# Patient Record
Sex: Male | Born: 1970 | Race: White | Hispanic: No | Marital: Married
Health system: Southern US, Academic
[De-identification: ages and names within clinical notes are randomized; demographics above are authoritative.]

## PROBLEM LIST (undated history)

## (undated) ENCOUNTER — Encounter

## (undated) ENCOUNTER — Ambulatory Visit: Payer: MEDICARE

## (undated) ENCOUNTER — Ambulatory Visit: Attending: Clinical | Primary: Clinical

## (undated) ENCOUNTER — Telehealth

## (undated) ENCOUNTER — Ambulatory Visit

## (undated) ENCOUNTER — Inpatient Hospital Stay

## (undated) ENCOUNTER — Encounter: Attending: Physical Medicine & Rehabilitation | Primary: Physical Medicine & Rehabilitation

## (undated) ENCOUNTER — Telehealth
Attending: Student in an Organized Health Care Education/Training Program | Primary: Student in an Organized Health Care Education/Training Program

## (undated) ENCOUNTER — Ambulatory Visit
Attending: Rehabilitative and Restorative Service Providers" | Primary: Rehabilitative and Restorative Service Providers"

## (undated) ENCOUNTER — Telehealth
Attending: Pharmacist Clinician (PhC)/ Clinical Pharmacy Specialist | Primary: Pharmacist Clinician (PhC)/ Clinical Pharmacy Specialist

## (undated) ENCOUNTER — Encounter: Attending: Clinical | Primary: Clinical

## (undated) ENCOUNTER — Ambulatory Visit
Payer: MEDICARE | Attending: Rehabilitative and Restorative Service Providers" | Primary: Rehabilitative and Restorative Service Providers"

## (undated) ENCOUNTER — Encounter
Attending: Pharmacist Clinician (PhC)/ Clinical Pharmacy Specialist | Primary: Pharmacist Clinician (PhC)/ Clinical Pharmacy Specialist

## (undated) ENCOUNTER — Ambulatory Visit: Payer: MEDICARE | Attending: Medical | Primary: Medical

## (undated) ENCOUNTER — Encounter: Attending: Cardiovascular Disease | Primary: Cardiovascular Disease

## (undated) ENCOUNTER — Ambulatory Visit: Payer: MEDICARE | Attending: Surgery | Primary: Surgery

## (undated) ENCOUNTER — Ambulatory Visit: Payer: MEDICARE | Attending: Cardiovascular Disease | Primary: Cardiovascular Disease

## (undated) ENCOUNTER — Telehealth: Attending: Adult Health | Primary: Adult Health

## (undated) ENCOUNTER — Ambulatory Visit: Payer: MEDICARE | Attending: Clinical | Primary: Clinical

## (undated) ENCOUNTER — Telehealth: Attending: Psychiatry | Primary: Psychiatry

## (undated) ENCOUNTER — Encounter: Attending: Infectious Disease | Primary: Infectious Disease

## (undated) ENCOUNTER — Ambulatory Visit: Payer: MEDICARE | Attending: Adult Health | Primary: Adult Health

## (undated) ENCOUNTER — Telehealth: Attending: Internal Medicine | Primary: Internal Medicine

## (undated) ENCOUNTER — Ambulatory Visit: Payer: MEDICARE | Attending: Physician Assistant | Primary: Physician Assistant

## (undated) ENCOUNTER — Encounter
Attending: Student in an Organized Health Care Education/Training Program | Primary: Student in an Organized Health Care Education/Training Program

## (undated) ENCOUNTER — Telehealth: Attending: Clinical | Primary: Clinical

## (undated) ENCOUNTER — Telehealth: Payer: MEDICARE | Attending: Medical | Primary: Medical

## (undated) ENCOUNTER — Telehealth: Attending: Medical | Primary: Medical

## (undated) ENCOUNTER — Ambulatory Visit: Attending: Pharmacist | Primary: Pharmacist

## (undated) ENCOUNTER — Ambulatory Visit: Payer: MEDICARE | Attending: Internal Medicine | Primary: Internal Medicine

## (undated) ENCOUNTER — Encounter: Attending: Internal Medicine | Primary: Internal Medicine

## (undated) ENCOUNTER — Ambulatory Visit: Payer: MEDICARE | Attending: Foot & Ankle Surgery | Primary: Foot & Ankle Surgery

## (undated) ENCOUNTER — Ambulatory Visit: Payer: MEDICARE | Attending: Nurse Practitioner | Primary: Nurse Practitioner

## (undated) ENCOUNTER — Ambulatory Visit: Payer: MEDICARE | Attending: Family | Primary: Family

## (undated) ENCOUNTER — Encounter: Attending: Nurse Practitioner | Primary: Nurse Practitioner

## (undated) ENCOUNTER — Encounter: Attending: Medical | Primary: Medical

## (undated) ENCOUNTER — Telehealth: Attending: Pediatric Pulmonology | Primary: Pediatric Pulmonology

## (undated) ENCOUNTER — Encounter: Attending: Neurology | Primary: Neurology

## (undated) ENCOUNTER — Ambulatory Visit
Payer: MEDICARE | Attending: Student in an Organized Health Care Education/Training Program | Primary: Student in an Organized Health Care Education/Training Program

## (undated) ENCOUNTER — Ambulatory Visit: Payer: MEDICARE | Attending: Nephrology | Primary: Nephrology

## (undated) DIAGNOSIS — I255 Ischemic cardiomyopathy: Secondary | ICD-10-CM

## (undated) DIAGNOSIS — I480 Paroxysmal atrial fibrillation: Secondary | ICD-10-CM

## (undated) DIAGNOSIS — E785 Hyperlipidemia, unspecified: Secondary | ICD-10-CM

## (undated) DIAGNOSIS — I10 Essential (primary) hypertension: Secondary | ICD-10-CM

## (undated) DIAGNOSIS — E119 Type 2 diabetes mellitus without complications: Secondary | ICD-10-CM

## (undated) DIAGNOSIS — I219 Acute myocardial infarction, unspecified: Secondary | ICD-10-CM

## (undated) DIAGNOSIS — I5022 Chronic systolic (congestive) heart failure: Secondary | ICD-10-CM

## (undated) DIAGNOSIS — N289 Disorder of kidney and ureter, unspecified: Secondary | ICD-10-CM

## (undated) DIAGNOSIS — I2510 Atherosclerotic heart disease of native coronary artery without angina pectoris: Secondary | ICD-10-CM

## (undated) HISTORY — PX: JOINT REPLACEMENT: SHX530

## (undated) HISTORY — PX: OTHER SURGICAL HISTORY: SHX169

## (undated) HISTORY — PX: PERCUTANEOUS CORONARY STENT INTERVENTION (PCI-S): SHX6016

## (undated) HISTORY — PX: CARDIAC SURGERY: SHX584

## (undated) MED ORDER — AMLODIPINE 10 MG TABLET: Freq: Every day | ORAL | 0.00000 days | Status: SS

## (undated) MED ORDER — ONDANSETRON HCL 4 MG TABLET: 4 mg | tablet | Freq: Three times a day (TID) | 0 refills | 10 days

---
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
## 2016-11-04 MED FILL — FLUOXETINE HCL/20MG/CAPS: FLUOXETINE HCL/20MG/CAPS | 90 days supply | Qty: 90 | Fill #3

## 2016-11-04 MED FILL — LOSARTAN POTASSIUM/25MG/TABS: LOSARTAN POTASSIUM/25MG/TABS | 90 days supply | Qty: 90 | Fill #1

## 2016-11-04 MED FILL — ELIQUIS/5MG/TABS: ELIQUIS/5MG/TABS | 90 days supply | Qty: 180 | Fill #1

## 2016-11-04 MED FILL — METFORMIN HYDROCHLORIDE/500MG/TABS: METFORMIN HYDROCHLORIDE/500MG/TABS | 90 days supply | Qty: 360 | Fill #1

## 2016-11-05 ENCOUNTER — Ambulatory Visit
Admission: RE | Admit: 2016-11-05 | Discharge: 2016-11-05 | Payer: MEDICARE | Attending: Student in an Organized Health Care Education/Training Program | Admitting: Student in an Organized Health Care Education/Training Program

## 2016-11-05 DIAGNOSIS — T148XXA Other injury of unspecified body region, initial encounter: Principal | ICD-10-CM

## 2016-11-05 MED ORDER — CYCLOBENZAPRINE 5 MG TABLET: 5 mg | tablet | Freq: Three times a day (TID) | 0 refills | 0 days | Status: AC

## 2016-11-05 MED ORDER — CYCLOBENZAPRINE 5 MG TABLET
ORAL_TABLET | Freq: Three times a day (TID) | ORAL | 0 refills | 0.00000 days | Status: CP | PRN
Start: 2016-11-05 — End: 2017-02-24

## 2016-11-05 MED FILL — CYCLOBENZAPRINE HCL/5MG/TABS: CYCLOBENZAPRINE HCL/5MG/TABS | 5 days supply | Qty: 15 | Fill #0

## 2016-11-19 ENCOUNTER — Ambulatory Visit: Admission: RE | Admit: 2016-11-19 | Discharge: 2016-11-19 | Payer: MEDICARE | Admitting: Physician Assistant

## 2016-11-19 ENCOUNTER — Ambulatory Visit: Admission: RE | Admit: 2016-11-19 | Discharge: 2016-11-19 | Payer: MEDICARE | Attending: Internal Medicine

## 2016-11-19 ENCOUNTER — Ambulatory Visit: Admission: RE | Admit: 2016-11-19 | Discharge: 2016-11-19 | Payer: MEDICARE | Attending: Clinical | Admitting: Clinical

## 2016-11-19 DIAGNOSIS — I10 Essential (primary) hypertension: Secondary | ICD-10-CM

## 2016-11-19 DIAGNOSIS — E1142 Type 2 diabetes mellitus with diabetic polyneuropathy: Principal | ICD-10-CM

## 2016-11-19 DIAGNOSIS — M109 Gout, unspecified: Secondary | ICD-10-CM

## 2016-11-19 DIAGNOSIS — F3342 Major depressive disorder, recurrent, in full remission: Principal | ICD-10-CM

## 2016-11-19 DIAGNOSIS — Z794 Long term (current) use of insulin: Secondary | ICD-10-CM

## 2016-11-19 MED ORDER — LIRAGLUTIDE 0.6 MG/0.1 ML (18 MG/3 ML) SUBCUTANEOUS PEN INJECTOR
Freq: Every day | SUBCUTANEOUS | 3 refills | 0.00000 days | Status: CP
Start: 2016-11-19 — End: 2017-04-17

## 2016-11-19 MED ORDER — LIRAGLUTIDE 0.6 MG/0.1 ML (18 MG/3 ML) SUBCUTANEOUS PEN INJECTOR: 2 mg | mL | Freq: Every day | 3 refills | 0 days | Status: AC

## 2016-11-27 DIAGNOSIS — I10 Essential (primary) hypertension: Secondary | ICD-10-CM

## 2016-11-27 DIAGNOSIS — E1142 Type 2 diabetes mellitus with diabetic polyneuropathy: Principal | ICD-10-CM

## 2016-11-27 DIAGNOSIS — Z794 Long term (current) use of insulin: Secondary | ICD-10-CM

## 2016-11-27 DIAGNOSIS — T148XXA Other injury of unspecified body region, initial encounter: Secondary | ICD-10-CM

## 2016-12-05 ENCOUNTER — Emergency Department
Admission: EM | Admit: 2016-12-05 | Discharge: 2016-12-05 | Disposition: A | Discharge status: Home / Self Care | Payer: MEDICARE | Source: Intra-hospital | Attending: Emergency Medicine | Admitting: Emergency Medicine

## 2016-12-05 ENCOUNTER — Emergency Department
Admission: EM | Admit: 2016-12-05 | Discharge: 2016-12-05 | Disposition: A | Discharge status: Home / Self Care | Payer: MEDICARE | Source: Intra-hospital

## 2016-12-05 DIAGNOSIS — S60562A Insect bite (nonvenomous) of left hand, initial encounter: Principal | ICD-10-CM

## 2016-12-09 ENCOUNTER — Ambulatory Visit
Admission: RE | Admit: 2016-12-09 | Discharge: 2016-12-09 | Payer: MEDICARE | Attending: Student in an Organized Health Care Education/Training Program | Admitting: Student in an Organized Health Care Education/Training Program

## 2016-12-09 DIAGNOSIS — M7989 Other specified soft tissue disorders: Principal | ICD-10-CM

## 2016-12-24 ENCOUNTER — Ambulatory Visit
Admission: RE | Admit: 2016-12-24 | Discharge: 2016-12-24 | Disposition: A | Discharge status: Home / Self Care | Payer: MEDICARE | Admitting: Internal Medicine

## 2016-12-24 ENCOUNTER — Ambulatory Visit
Admission: RE | Admit: 2016-12-24 | Discharge: 2016-12-24 | Disposition: A | Discharge status: Home / Self Care | Payer: MEDICARE

## 2016-12-24 DIAGNOSIS — I480 Paroxysmal atrial fibrillation: Secondary | ICD-10-CM

## 2016-12-24 DIAGNOSIS — E119 Type 2 diabetes mellitus without complications: Principal | ICD-10-CM

## 2016-12-24 DIAGNOSIS — M1A39X1 Chronic gout due to renal impairment, multiple sites, with tophus (tophi): Secondary | ICD-10-CM

## 2016-12-24 MED ORDER — APIXABAN 5 MG TABLET
ORAL_TABLET | Freq: Two times a day (BID) | ORAL | 11 refills | 0.00000 days
Start: 2016-12-24 — End: 2016-12-24

## 2016-12-24 MED ORDER — APIXABAN 5 MG TABLET: 5 mg | tablet | Freq: Two times a day (BID) | 11 refills | 0 days | Status: AC

## 2016-12-25 MED ORDER — MAGNESIUM OXIDE 400 MG (241.3 MG MAGNESIUM) TABLET: 400 mg | tablet | Freq: Every day | 1 refills | 0 days | Status: AC

## 2016-12-25 MED ORDER — MAGNESIUM OXIDE 400 MG (241.3 MG MAGNESIUM) TABLET
ORAL_TABLET | Freq: Every day | ORAL | 1 refills | 0.00000 days | Status: CP
Start: 2016-12-25 — End: 2017-01-30

## 2016-12-25 MED FILL — MAGNESIUM OXIDE/400MG/TABS: MAGNESIUM OXIDE/400MG/TABS | 30 days supply | Qty: 30 | Fill #0

## 2017-01-13 ENCOUNTER — Inpatient Hospital Stay
Admission: EM | Admit: 2017-01-13 | Discharge: 2017-01-15 | Disposition: A | Discharge status: Home / Self Care | Payer: MEDICARE | Source: Intra-hospital

## 2017-01-13 ENCOUNTER — Inpatient Hospital Stay
Admission: EM | Admit: 2017-01-13 | Discharge: 2017-01-15 | Disposition: A | Discharge status: Home / Self Care | Payer: MEDICARE | Source: Intra-hospital | Attending: Cardiovascular Disease | Admitting: Cardiovascular Disease

## 2017-01-13 DIAGNOSIS — I214 Non-ST elevation (NSTEMI) myocardial infarction: Principal | ICD-10-CM

## 2017-01-14 DIAGNOSIS — I214 Non-ST elevation (NSTEMI) myocardial infarction: Principal | ICD-10-CM

## 2017-01-15 MED ORDER — ISOSORBIDE MONONITRATE ER 30 MG TABLET,EXTENDED RELEASE 24 HR
ORAL_TABLET | Freq: Every day | ORAL | 1 refills | 0 days | Status: SS
Start: 2017-01-15 — End: 2017-02-13

## 2017-01-15 MED FILL — ISOSORBIDE MONONIT/30MG ER/TAB: ISOSORBIDE MONONIT/30MG ER/TAB | 30 days supply | Qty: 30 | Fill #0

## 2017-01-19 ENCOUNTER — Ambulatory Visit
Admission: RE | Admit: 2017-01-19 | Discharge: 2017-01-19 | Disposition: A | Discharge status: Home / Self Care | Payer: MEDICARE

## 2017-01-19 ENCOUNTER — Ambulatory Visit
Admission: RE | Admit: 2017-01-19 | Discharge: 2017-01-19 | Disposition: A | Discharge status: Home / Self Care | Payer: MEDICARE | Admitting: Internal Medicine

## 2017-01-19 ENCOUNTER — Ambulatory Visit: Admission: RE | Admit: 2017-01-19 | Discharge: 2017-01-19 | Disposition: A | Discharge status: Home / Self Care

## 2017-01-19 DIAGNOSIS — N179 Acute kidney failure, unspecified: Principal | ICD-10-CM

## 2017-01-19 DIAGNOSIS — E875 Hyperkalemia: Secondary | ICD-10-CM

## 2017-01-19 DIAGNOSIS — Z09 Encounter for follow-up examination after completed treatment for conditions other than malignant neoplasm: Secondary | ICD-10-CM

## 2017-01-19 DIAGNOSIS — E1122 Type 2 diabetes mellitus with diabetic chronic kidney disease: Secondary | ICD-10-CM

## 2017-01-19 DIAGNOSIS — Z794 Long term (current) use of insulin: Secondary | ICD-10-CM

## 2017-01-19 DIAGNOSIS — N182 Chronic kidney disease, stage 2 (mild): Secondary | ICD-10-CM

## 2017-01-19 DIAGNOSIS — I480 Paroxysmal atrial fibrillation: Secondary | ICD-10-CM

## 2017-01-19 DIAGNOSIS — I5022 Chronic systolic (congestive) heart failure: Secondary | ICD-10-CM

## 2017-01-19 MED ORDER — UNIFINE PENTIPS 31 GAUGE X 5/16" NEEDLE
3 refills | 0 days | Status: CP
Start: 2017-01-19 — End: 2017-09-08

## 2017-01-19 MED ORDER — PEN NEEDLE, DIABETIC 31 GAUGE X 5/16" (8 MM)
3 refills | 0.00000 days
Start: 2017-01-19 — End: 2017-01-19

## 2017-01-19 MED ORDER — PEN NEEDLE, DIABETIC 31 GAUGE X 5/16" (8 MM): each | 0 refills | 0 days

## 2017-01-19 MED FILL — MAGNESIUM OXIDE/400MG/TABS: MAGNESIUM OXIDE/400MG/TABS | 30 days supply | Qty: 30 | Fill #1

## 2017-01-19 MED FILL — CARVEDILOL/25MG/TABS: CARVEDILOL/25MG/TABS | 90 days supply | Qty: 360 | Fill #1

## 2017-01-19 MED FILL — ATORVASTATIN CALCIUM/80MG/TABS: ATORVASTATIN CALCIUM/80MG/TABS | 90 days supply | Qty: 90 | Fill #1

## 2017-01-19 MED FILL — TRAZODONE HCL/50MG/TABS: TRAZODONE HCL/50MG/TABS | 45 days supply | Qty: 90 | Fill #1

## 2017-01-19 MED FILL — UF SHORT PEN NEEDLE/31G-8MM/NDL: UF SHORT PEN NEEDLE/31G-8MM/NDL | 20 days supply | Qty: 1 | Fill #0

## 2017-01-19 MED FILL — OMEPRAZOLE/20MG/CPDR: OMEPRAZOLE/20MG/CPDR | 30 days supply | Qty: 30 | Fill #1

## 2017-01-20 MED ORDER — NITROGLYCERIN 0.4 MG SUBLINGUAL TABLET
ORAL_TABLET | 11 refills | 0 days | Status: CP
Start: 2017-01-20 — End: 2017-05-18

## 2017-01-20 MED ORDER — PREGABALIN 50 MG CAPSULE
ORAL_CAPSULE | Freq: Three times a day (TID) | ORAL | 11 refills | 0 days | Status: CP
Start: 2017-01-20 — End: 2017-02-24

## 2017-01-30 ENCOUNTER — Ambulatory Visit
Admission: RE | Admit: 2017-01-30 | Discharge: 2017-01-30 | Disposition: A | Discharge status: Home / Self Care | Admitting: Adult Health

## 2017-01-30 DIAGNOSIS — I10 Essential (primary) hypertension: Secondary | ICD-10-CM

## 2017-01-30 DIAGNOSIS — E875 Hyperkalemia: Secondary | ICD-10-CM

## 2017-01-30 DIAGNOSIS — N179 Acute kidney failure, unspecified: Secondary | ICD-10-CM

## 2017-01-30 DIAGNOSIS — G4733 Obstructive sleep apnea (adult) (pediatric): Secondary | ICD-10-CM

## 2017-01-30 DIAGNOSIS — E1122 Type 2 diabetes mellitus with diabetic chronic kidney disease: Secondary | ICD-10-CM

## 2017-01-30 DIAGNOSIS — I25118 Atherosclerotic heart disease of native coronary artery with other forms of angina pectoris: Principal | ICD-10-CM

## 2017-01-30 DIAGNOSIS — I480 Paroxysmal atrial fibrillation: Secondary | ICD-10-CM

## 2017-01-30 DIAGNOSIS — I5022 Chronic systolic (congestive) heart failure: Secondary | ICD-10-CM

## 2017-01-30 DIAGNOSIS — Z794 Long term (current) use of insulin: Secondary | ICD-10-CM

## 2017-01-30 DIAGNOSIS — E7800 Pure hypercholesterolemia, unspecified: Secondary | ICD-10-CM

## 2017-01-30 DIAGNOSIS — N182 Chronic kidney disease, stage 2 (mild): Secondary | ICD-10-CM

## 2017-01-30 MED ORDER — LOSARTAN 25 MG TABLET
ORAL_TABLET | Freq: Every day | ORAL | 3 refills | 0.00000 days | Status: CP
Start: 2017-01-30 — End: 2017-01-30

## 2017-01-30 MED ORDER — METFORMIN 500 MG TABLET: 500 mg | tablet | 3 refills | 0 days

## 2017-01-30 MED ORDER — METFORMIN 500 MG TABLET
ORAL_TABLET | Freq: Two times a day (BID) | ORAL | 3 refills | 0.00000 days | Status: CP
Start: 2017-01-30 — End: 2017-01-30

## 2017-01-30 MED ORDER — LOSARTAN 25 MG TABLET: 25 mg | tablet | Freq: Every day | 3 refills | 0 days | Status: AC

## 2017-02-02 ENCOUNTER — Ambulatory Visit
Admission: RE | Admit: 2017-02-02 | Discharge: 2017-02-02 | Disposition: A | Discharge status: Home / Self Care | Payer: MEDICARE | Admitting: Internal Medicine

## 2017-02-02 ENCOUNTER — Ambulatory Visit
Admission: RE | Admit: 2017-02-02 | Discharge: 2017-02-02 | Disposition: A | Discharge status: Home / Self Care | Payer: MEDICARE | Attending: Clinical | Admitting: Clinical

## 2017-02-02 ENCOUNTER — Ambulatory Visit
Admission: RE | Admit: 2017-02-02 | Discharge: 2017-02-02 | Disposition: A | Discharge status: Home / Self Care | Payer: MEDICARE

## 2017-02-02 DIAGNOSIS — E1122 Type 2 diabetes mellitus with diabetic chronic kidney disease: Secondary | ICD-10-CM

## 2017-02-02 DIAGNOSIS — E032 Hypothyroidism due to medicaments and other exogenous substances: Principal | ICD-10-CM

## 2017-02-02 DIAGNOSIS — I10 Essential (primary) hypertension: Secondary | ICD-10-CM

## 2017-02-02 DIAGNOSIS — F3342 Major depressive disorder, recurrent, in full remission: Secondary | ICD-10-CM

## 2017-02-02 DIAGNOSIS — N182 Chronic kidney disease, stage 2 (mild): Secondary | ICD-10-CM

## 2017-02-02 DIAGNOSIS — I25118 Atherosclerotic heart disease of native coronary artery with other forms of angina pectoris: Secondary | ICD-10-CM

## 2017-02-02 DIAGNOSIS — Z794 Long term (current) use of insulin: Secondary | ICD-10-CM

## 2017-02-11 ENCOUNTER — Ambulatory Visit: Admission: RE | Admit: 2017-02-11 | Discharge: 2017-02-11 | Payer: MEDICARE | Admitting: Physician Assistant

## 2017-02-11 DIAGNOSIS — I25118 Atherosclerotic heart disease of native coronary artery with other forms of angina pectoris: Secondary | ICD-10-CM

## 2017-02-11 DIAGNOSIS — E1142 Type 2 diabetes mellitus with diabetic polyneuropathy: Principal | ICD-10-CM

## 2017-02-11 DIAGNOSIS — I10 Essential (primary) hypertension: Secondary | ICD-10-CM

## 2017-02-11 DIAGNOSIS — Z794 Long term (current) use of insulin: Secondary | ICD-10-CM

## 2017-02-13 ENCOUNTER — Ambulatory Visit
Admission: RE | Admit: 2017-02-13 | Discharge: 2017-02-13 | Disposition: A | Discharge status: Home / Self Care | Payer: MEDICARE | Attending: Cardiovascular Disease | Admitting: Cardiovascular Disease

## 2017-02-13 MED ORDER — COLCHICINE 0.6 MG TABLET
ORAL_TABLET | ORAL | 11 refills | 0 days
Start: 2017-02-13 — End: 2017-02-13

## 2017-02-13 MED ORDER — ISOSORBIDE MONONITRATE ER 30 MG TABLET,EXTENDED RELEASE 24 HR: tablet | 1 refills | 0 days

## 2017-02-13 MED ORDER — COLCRYS 0.6 MG TABLET
ORAL_TABLET | 11 refills | 0 days | Status: CP
Start: 2017-02-13 — End: 2017-04-17

## 2017-02-13 MED ORDER — ISOSORBIDE MONONITRATE ER 30 MG TABLET,EXTENDED RELEASE 24 HR
ORAL_TABLET | Freq: Every day | ORAL | 1 refills | 0.00000 days | Status: CP
Start: 2017-02-13 — End: 2017-04-17

## 2017-02-13 MED FILL — ELIQUIS/5MG/TABS: ELIQUIS/5MG/TABS | 90 days supply | Qty: 180 | Fill #2

## 2017-02-13 MED FILL — ISOSORBIDE MONONIT/30MG ER/TAB: ISOSORBIDE MONONIT/30MG ER/TAB | 60 days supply | Qty: 120 | Fill #0

## 2017-02-13 MED FILL — FLUOXETINE HCL/40MG/CAPS: FLUOXETINE HCL/40MG/CAPS | 90 days supply | Qty: 90 | Fill #1

## 2017-02-13 MED FILL — UF SHORT PEN NEEDLE/31G-8MM/NDL: UF SHORT PEN NEEDLE/31G-8MM/NDL | 80 days supply | Qty: 4 | Fill #0

## 2017-02-13 MED FILL — COLCRYS/0.6MG/TABS: COLCRYS/0.6MG/TABS | 30 days supply | Qty: 30 | Fill #0

## 2017-02-13 MED FILL — EZETIMIBE/10MG/TABS: EZETIMIBE/10MG/TABS | 90 days supply | Qty: 90 | Fill #1

## 2017-02-24 ENCOUNTER — Ambulatory Visit: Admission: RE | Admit: 2017-02-24 | Discharge: 2017-02-24 | Payer: MEDICARE | Attending: Internal Medicine

## 2017-02-24 DIAGNOSIS — I480 Paroxysmal atrial fibrillation: Principal | ICD-10-CM

## 2017-02-24 DIAGNOSIS — M1A39X1 Chronic gout due to renal impairment, multiple sites, with tophus (tophi): Secondary | ICD-10-CM

## 2017-02-24 DIAGNOSIS — Z794 Long term (current) use of insulin: Secondary | ICD-10-CM

## 2017-02-24 DIAGNOSIS — E1122 Type 2 diabetes mellitus with diabetic chronic kidney disease: Secondary | ICD-10-CM

## 2017-02-24 DIAGNOSIS — I5022 Chronic systolic (congestive) heart failure: Secondary | ICD-10-CM

## 2017-02-24 DIAGNOSIS — J069 Acute upper respiratory infection, unspecified: Secondary | ICD-10-CM

## 2017-02-24 DIAGNOSIS — N182 Chronic kidney disease, stage 2 (mild): Secondary | ICD-10-CM

## 2017-02-24 DIAGNOSIS — I10 Essential (primary) hypertension: Secondary | ICD-10-CM

## 2017-02-24 MED ORDER — METFORMIN 500 MG TABLET
Freq: Two times a day (BID) | ORAL | 0 days
Start: 2017-02-24 — End: 2017-04-17

## 2017-02-24 MED ORDER — CARVEDILOL 25 MG TABLET
Freq: Two times a day (BID) | ORAL | 0 days
Start: 2017-02-24 — End: 2017-04-17

## 2017-02-24 MED ORDER — INSULIN DETEMIR (U-100) 100 UNIT/ML (3 ML) SUBCUTANEOUS PEN
Freq: Every evening | SUBCUTANEOUS | 0 days
Start: 2017-02-24 — End: 2017-04-17

## 2017-02-24 MED ORDER — ALLOPURINOL 100 MG TABLET
ORAL_TABLET | Freq: Every day | ORAL | 0 refills | 0 days | Status: SS
Start: 2017-02-24 — End: 2017-03-25

## 2017-02-24 MED FILL — ALLOPURINOL/100MG/TAB: ALLOPURINOL/100MG/TAB | 30 days supply | Qty: 30 | Fill #0

## 2017-03-03 ENCOUNTER — Ambulatory Visit
Admission: RE | Admit: 2017-03-03 | Discharge: 2017-03-03 | Disposition: A | Discharge status: Home / Self Care | Payer: MEDICARE

## 2017-03-03 ENCOUNTER — Ambulatory Visit: Admission: RE | Admit: 2017-03-03 | Discharge: 2017-03-03 | Payer: MEDICARE | Admitting: Physician Assistant

## 2017-03-03 DIAGNOSIS — Z950 Presence of cardiac pacemaker: Principal | ICD-10-CM

## 2017-03-03 DIAGNOSIS — E1122 Type 2 diabetes mellitus with diabetic chronic kidney disease: Secondary | ICD-10-CM

## 2017-03-03 DIAGNOSIS — I10 Essential (primary) hypertension: Principal | ICD-10-CM

## 2017-03-03 DIAGNOSIS — Z794 Long term (current) use of insulin: Secondary | ICD-10-CM

## 2017-03-03 DIAGNOSIS — I25118 Atherosclerotic heart disease of native coronary artery with other forms of angina pectoris: Secondary | ICD-10-CM

## 2017-03-03 DIAGNOSIS — N182 Chronic kidney disease, stage 2 (mild): Secondary | ICD-10-CM

## 2017-03-03 MED ORDER — INSULIN ASPART (U-100) 100 UNIT/ML (3 ML) SUBCUTANEOUS PEN: 17 [IU] | mL | Freq: Three times a day (TID) | 0 refills | 0 days | Status: AC

## 2017-03-03 MED ORDER — INSULIN ASPART (U-100) 100 UNIT/ML (3 ML) SUBCUTANEOUS PEN
Freq: Three times a day (TID) | SUBCUTANEOUS | 99 refills | 0.00000 days | Status: CP
Start: 2017-03-03 — End: 2017-03-03

## 2017-03-03 MED FILL — NOVOLOG FLEXPEN(BOX)/100UNIT/ML/INJ: NOVOLOG FLEXPEN(BOX)/100UNIT/ML/INJ | 29 days supply | Qty: 1 | Fill #0

## 2017-03-04 ENCOUNTER — Ambulatory Visit: Admission: RE | Admit: 2017-03-04 | Discharge: 2017-03-04 | Disposition: A | Discharge status: Home / Self Care

## 2017-03-04 ENCOUNTER — Ambulatory Visit
Admission: RE | Admit: 2017-03-04 | Discharge: 2017-03-04 | Disposition: A | Discharge status: Home / Self Care | Payer: MEDICARE

## 2017-03-04 DIAGNOSIS — I2510 Atherosclerotic heart disease of native coronary artery without angina pectoris: Principal | ICD-10-CM

## 2017-03-04 MED FILL — NITROGLYCERIN/0.4MG/SUBL: NITROGLYCERIN/0.4MG/SUBL | 30 days supply | Qty: 1 | Fill #1

## 2017-03-04 MED FILL — LEVEMIR FLEXTOUCH (BOX)/100UN/ML/SOLN: LEVEMIR FLEXTOUCH (BOX)/100UN/ML/SOLN | 30 days supply | Qty: 1 | Fill #3

## 2017-03-05 ENCOUNTER — Ambulatory Visit: Admission: RE | Admit: 2017-03-05 | Discharge: 2017-03-05

## 2017-03-05 DIAGNOSIS — I480 Paroxysmal atrial fibrillation: Secondary | ICD-10-CM

## 2017-03-05 DIAGNOSIS — I10 Essential (primary) hypertension: Secondary | ICD-10-CM

## 2017-03-05 DIAGNOSIS — I5022 Chronic systolic (congestive) heart failure: Principal | ICD-10-CM

## 2017-03-05 DIAGNOSIS — I2510 Atherosclerotic heart disease of native coronary artery without angina pectoris: Secondary | ICD-10-CM

## 2017-03-05 DIAGNOSIS — G4733 Obstructive sleep apnea (adult) (pediatric): Secondary | ICD-10-CM

## 2017-03-05 MED ORDER — LOSARTAN 25 MG TABLET
ORAL_TABLET | Freq: Every day | ORAL | 3 refills | 0.00000 days
Start: 2017-03-05 — End: 2017-04-17

## 2017-03-05 MED FILL — VICTOZA (3 PENS)/18MG/3ML/SOLN: VICTOZA (3 PENS)/18MG/3ML/SOLN | 45 days supply | Qty: 1 | Fill #2

## 2017-03-16 ENCOUNTER — Inpatient Hospital Stay
Admission: EM | Admit: 2017-03-16 | Discharge: 2017-04-17 | Disposition: A | Discharge status: Skilled Nursing Facility | Payer: MEDICARE | Source: Intra-hospital

## 2017-03-16 ENCOUNTER — Ambulatory Visit
Admit: 2017-03-16 | Discharge: 2017-04-17 | Disposition: A | Discharge status: Skilled Nursing Facility | Payer: MEDICARE

## 2017-03-16 ENCOUNTER — Inpatient Hospital Stay
Admission: EM | Admit: 2017-03-16 | Discharge: 2017-04-17 | Disposition: A | Discharge status: Skilled Nursing Facility | Payer: MEDICARE | Source: Intra-hospital | Attending: Anesthesiology | Admitting: Anesthesiology

## 2017-03-16 ENCOUNTER — Encounter
Admit: 2017-03-16 | Discharge: 2017-04-17 | Disposition: A | Discharge status: Skilled Nursing Facility | Payer: MEDICARE | Attending: Anesthesiology

## 2017-03-16 DIAGNOSIS — I214 Non-ST elevation (NSTEMI) myocardial infarction: Principal | ICD-10-CM

## 2017-03-16 MED ORDER — INSULIN ASPART (U-100) 100 UNIT/ML (3 ML) SUBCUTANEOUS PEN
Freq: Three times a day (TID) | SUBCUTANEOUS | prn refills | 0.00000 days | Status: CP
Start: 2017-03-16 — End: 2017-04-17

## 2017-03-16 MED ORDER — INSULIN ASPART (U-100) 100 UNIT/ML (3 ML) SUBCUTANEOUS PEN: mL | 99 refills | 0 days

## 2017-03-17 DIAGNOSIS — I214 Non-ST elevation (NSTEMI) myocardial infarction: Principal | ICD-10-CM

## 2017-03-18 DIAGNOSIS — I214 Non-ST elevation (NSTEMI) myocardial infarction: Principal | ICD-10-CM

## 2017-03-19 DIAGNOSIS — I214 Non-ST elevation (NSTEMI) myocardial infarction: Principal | ICD-10-CM

## 2017-03-20 DIAGNOSIS — I214 Non-ST elevation (NSTEMI) myocardial infarction: Principal | ICD-10-CM

## 2017-03-21 DIAGNOSIS — I214 Non-ST elevation (NSTEMI) myocardial infarction: Principal | ICD-10-CM

## 2017-03-22 DIAGNOSIS — I214 Non-ST elevation (NSTEMI) myocardial infarction: Principal | ICD-10-CM

## 2017-03-23 DIAGNOSIS — I214 Non-ST elevation (NSTEMI) myocardial infarction: Principal | ICD-10-CM

## 2017-03-24 DIAGNOSIS — I214 Non-ST elevation (NSTEMI) myocardial infarction: Principal | ICD-10-CM

## 2017-03-25 DIAGNOSIS — I214 Non-ST elevation (NSTEMI) myocardial infarction: Principal | ICD-10-CM

## 2017-03-25 MED ORDER — ALLOPURINOL 100 MG TABLET
ORAL_TABLET | PRN refills | 0 days
Start: 2017-03-25 — End: 2017-04-17

## 2017-03-26 DIAGNOSIS — I214 Non-ST elevation (NSTEMI) myocardial infarction: Principal | ICD-10-CM

## 2017-03-27 DIAGNOSIS — I214 Non-ST elevation (NSTEMI) myocardial infarction: Principal | ICD-10-CM

## 2017-03-28 DIAGNOSIS — I214 Non-ST elevation (NSTEMI) myocardial infarction: Principal | ICD-10-CM

## 2017-03-29 DIAGNOSIS — I214 Non-ST elevation (NSTEMI) myocardial infarction: Principal | ICD-10-CM

## 2017-03-30 DIAGNOSIS — I214 Non-ST elevation (NSTEMI) myocardial infarction: Principal | ICD-10-CM

## 2017-03-31 DIAGNOSIS — I214 Non-ST elevation (NSTEMI) myocardial infarction: Principal | ICD-10-CM

## 2017-04-01 DIAGNOSIS — I214 Non-ST elevation (NSTEMI) myocardial infarction: Principal | ICD-10-CM

## 2017-04-02 DIAGNOSIS — I214 Non-ST elevation (NSTEMI) myocardial infarction: Principal | ICD-10-CM

## 2017-04-03 DIAGNOSIS — I214 Non-ST elevation (NSTEMI) myocardial infarction: Principal | ICD-10-CM

## 2017-04-05 DIAGNOSIS — I214 Non-ST elevation (NSTEMI) myocardial infarction: Principal | ICD-10-CM

## 2017-04-06 DIAGNOSIS — I214 Non-ST elevation (NSTEMI) myocardial infarction: Principal | ICD-10-CM

## 2017-04-07 DIAGNOSIS — I214 Non-ST elevation (NSTEMI) myocardial infarction: Principal | ICD-10-CM

## 2017-04-08 DIAGNOSIS — I214 Non-ST elevation (NSTEMI) myocardial infarction: Principal | ICD-10-CM

## 2017-04-10 DIAGNOSIS — I214 Non-ST elevation (NSTEMI) myocardial infarction: Principal | ICD-10-CM

## 2017-04-13 DIAGNOSIS — I214 Non-ST elevation (NSTEMI) myocardial infarction: Principal | ICD-10-CM

## 2017-04-14 DIAGNOSIS — I214 Non-ST elevation (NSTEMI) myocardial infarction: Principal | ICD-10-CM

## 2017-04-17 MED ORDER — INSULIN LISPRO (U-100) 100 UNIT/ML SUBCUTANEOUS SOLUTION
Freq: Four times a day (QID) | SUBCUTANEOUS | 0 refills | 0.00000 days
Start: 2017-04-17 — End: 2017-05-18

## 2017-04-17 MED ORDER — IPRATROPIUM BROMIDE 0.02 % SOLUTION FOR INHALATION
Freq: Four times a day (QID) | RESPIRATORY_TRACT | 12 refills | 0 days
Start: 2017-04-17 — End: 2017-05-08

## 2017-04-17 MED ORDER — BUSPIRONE 5 MG TABLET
ORAL_TABLET | Freq: Two times a day (BID) | ORAL | 0 refills | 0 days
Start: 2017-04-17 — End: 2017-05-13

## 2017-04-17 MED ORDER — ISOSORBIDE DINITRATE 40 MG TABLET
ORAL_TABLET | Freq: Three times a day (TID) | ORAL | 11 refills | 0 days
Start: 2017-04-17 — End: 2017-05-13

## 2017-04-17 MED ORDER — TICAGRELOR 90 MG TABLET
ORAL_TABLET | Freq: Two times a day (BID) | ORAL | 0 refills | 0 days
Start: 2017-04-17 — End: 2017-05-08

## 2017-04-17 MED ORDER — AMOXICILLIN 500 MG-POTASSIUM CLAVULANATE 125 MG TABLET
ORAL_TABLET | Freq: Every day | ORAL | 0 refills | 0.00000 days
Start: 2017-04-17 — End: 2017-05-08

## 2017-04-17 MED ORDER — METOPROLOL SUCCINATE ER 100 MG TABLET,EXTENDED RELEASE 24 HR
ORAL_TABLET | Freq: Every day | ORAL | 0 refills | 0 days
Start: 2017-04-17 — End: 2017-05-13

## 2017-04-17 MED ORDER — AMIODARONE 200 MG TABLET
ORAL_TABLET | Freq: Every day | ORAL | 11 refills | 0 days
Start: 2017-04-17 — End: 2017-05-18

## 2017-04-17 MED ORDER — SEVELAMER CARBONATE 800 MG TABLET
ORAL_TABLET | Freq: Three times a day (TID) | ORAL | 11 refills | 0 days
Start: 2017-04-17 — End: 2017-05-08

## 2017-04-17 MED ORDER — CHOLECALCIFEROL (VITAMIN D3) 125 MCG (5,000 UNIT) TABLET
Freq: Every day | NASOGASTRIC | 0 refills | 0 days | Status: SS
Start: 2017-04-17 — End: 2017-05-13

## 2017-04-17 MED ORDER — HYDRALAZINE 100 MG TABLET
ORAL_TABLET | Freq: Three times a day (TID) | ORAL | 0 refills | 0 days
Start: 2017-04-17 — End: 2017-05-08

## 2017-04-17 MED ORDER — FAMOTIDINE 20 MG TABLET
ORAL_TABLET | Freq: Every day | ORAL | 0 refills | 0 days
Start: 2017-04-17 — End: 2017-05-18

## 2017-04-17 MED ORDER — INSULIN NPH ISOPHANE U-100 HUMAN 100 UNIT/ML SUBCUTANEOUS SUSPENSION
Freq: Two times a day (BID) | SUBCUTANEOUS | 0 refills | 0 days
Start: 2017-04-17 — End: 2017-05-18

## 2017-04-17 MED ORDER — WARFARIN 6 MG TABLET
0 refills | 0 days
Start: 2017-04-17 — End: 2017-05-08

## 2017-04-19 ENCOUNTER — Ambulatory Visit
Admit: 2017-04-19 | Discharge: 2017-05-08 | Disposition: A | Discharge status: Rehabilitation Facility | Payer: MEDICARE | Source: Intra-hospital | Admitting: Clinical Cardiac Electrophysiology

## 2017-04-19 ENCOUNTER — Encounter
Admit: 2017-04-19 | Discharge: 2017-05-08 | Disposition: A | Discharge status: Rehabilitation Facility | Payer: MEDICARE | Source: Intra-hospital | Admitting: Clinical Cardiac Electrophysiology

## 2017-04-19 DIAGNOSIS — R079 Chest pain, unspecified: Principal | ICD-10-CM

## 2017-04-22 DIAGNOSIS — R079 Chest pain, unspecified: Principal | ICD-10-CM

## 2017-04-23 DIAGNOSIS — R079 Chest pain, unspecified: Principal | ICD-10-CM

## 2017-05-02 DIAGNOSIS — R079 Chest pain, unspecified: Principal | ICD-10-CM

## 2017-05-04 DIAGNOSIS — R079 Chest pain, unspecified: Principal | ICD-10-CM

## 2017-05-07 MED ORDER — TRAZODONE 50 MG TABLET
ORAL_TABLET | Freq: Every evening | ORAL | 3 refills | 0.00000 days
Start: 2017-05-07 — End: 2017-05-18

## 2017-05-07 MED ORDER — TRAMADOL 50 MG TABLET
Freq: Four times a day (QID) | ORAL | 0 refills | 0 days | Status: SS | PRN
Start: 2017-05-07 — End: 2017-05-13

## 2017-05-07 MED ORDER — SEVELAMER CARBONATE 800 MG TABLET
ORAL_TABLET | Freq: Three times a day (TID) | ORAL | 11 refills | 0.00000 days
Start: 2017-05-07 — End: 2017-05-18

## 2017-05-07 MED ORDER — HYDRALAZINE 25 MG TABLET
ORAL_TABLET | Freq: Three times a day (TID) | ORAL | 0 refills | 0.00000 days
Start: 2017-05-07 — End: 2017-05-13

## 2017-05-07 MED ORDER — ONDANSETRON 4 MG DISINTEGRATING TABLET
Freq: Three times a day (TID) | ORAL | 0 refills | 0 days | PRN
Start: 2017-05-07 — End: 2017-05-18

## 2017-05-07 MED ORDER — BUTALBITAL-ACETAMINOPHEN-CAFFEINE 50 MG-325 MG-40 MG TABLET
Freq: Four times a day (QID) | ORAL | 0 refills | 0 days | PRN
Start: 2017-05-07 — End: 2017-05-18

## 2017-05-07 MED ORDER — LACTULOSE 20 GRAM ORAL PACKET
PACK | Freq: Three times a day (TID) | ORAL | 0 refills | 0 days
Start: 2017-05-07 — End: 2017-05-18

## 2017-05-07 MED ORDER — NICOTINE (POLACRILEX) 4 MG GUM
BUCCAL | 0 refills | 0 days | PRN
Start: 2017-05-07 — End: 2017-05-18

## 2017-05-07 MED ORDER — TICAGRELOR 90 MG TABLET
ORAL_TABLET | Freq: Two times a day (BID) | ORAL | 0 refills | 0 days
Start: 2017-05-07 — End: 2017-05-18

## 2017-05-07 MED ORDER — WARFARIN 4 MG TABLET
0 refills | 0 days | Status: SS
Start: 2017-05-07 — End: 2017-05-13

## 2017-05-08 ENCOUNTER — Ambulatory Visit
Admission: TF | Admit: 2017-05-08 | Discharge: 2017-05-18 | Disposition: A | Discharge status: Other Acute Inpatient Hospital | Payer: MEDICARE | Source: Intra-hospital | Admitting: Spinal Cord Injury Medicine

## 2017-05-08 MED ORDER — ASPIRIN 81 MG CHEWABLE TABLET
ORAL_TABLET | Freq: Every day | ORAL | 0 refills | 0.00000 days | Status: CP
Start: 2017-05-08 — End: 2017-05-08

## 2017-05-08 MED ORDER — NICOTINE 14 MG/24 HR DAILY TRANSDERMAL PATCH
MEDICATED_PATCH | Freq: Every day | TRANSDERMAL | 0 refills | 0.00000 days
Start: 2017-05-08 — End: 2017-05-18

## 2017-05-08 MED ORDER — FLUOXETINE 20 MG CAPSULE
ORAL_CAPSULE | Freq: Every day | ORAL | 0 refills | 0.00000 days
Start: 2017-05-08 — End: 2017-05-18

## 2017-05-08 MED ORDER — MAGNESIUM OXIDE 400 MG (241.3 MG MAGNESIUM) TABLET
ORAL_TABLET | Freq: Two times a day (BID) | ORAL | 11 refills | 0.00000 days | Status: CP
Start: 2017-05-08 — End: 2017-05-18

## 2017-05-08 MED ORDER — MAGNESIUM OXIDE 400 MG (241.3 MG MAGNESIUM) TABLET: tablet | 11 refills | 0 days

## 2017-05-08 MED ORDER — ASPIRIN 81 MG CHEWABLE TABLET: 81 mg | tablet | Freq: Every day | 0 refills | 0 days | Status: AC

## 2017-05-08 MED ORDER — BUMETANIDE 2 MG TABLET
ORAL_TABLET | Freq: Every day | ORAL | 0 refills | 0 days
Start: 2017-05-08 — End: 2017-05-18

## 2017-05-13 MED ORDER — ALBUTEROL SULFATE 2.5 MG/3 ML (0.083 %) SOLUTION FOR NEBULIZATION
RESPIRATORY_TRACT | 0 refills | 0 days | PRN
Start: 2017-05-13 — End: 2017-05-18

## 2017-05-13 MED ORDER — TRAMADOL 50 MG TABLET
Freq: Four times a day (QID) | ORAL | 0 refills | 0 days | PRN
Start: 2017-05-13 — End: 2017-05-18

## 2017-05-13 MED ORDER — SODIUM BICARBONATE 650 MG TABLET
ORAL_TABLET | Freq: Four times a day (QID) | ORAL | 0 refills | 0.00000 days
Start: 2017-05-13 — End: 2017-05-18

## 2017-05-13 MED ORDER — WARFARIN 4 MG TABLET: 10 mg | Freq: Every evening | 0 refills | 0 days

## 2017-05-13 MED ORDER — CHOLECALCIFEROL (VITAMIN D3) 125 MCG (5,000 UNIT) TABLET
Freq: Every day | ORAL | 0 refills | 0.00000 days
Start: 2017-05-13 — End: 2017-05-18

## 2017-05-13 MED ORDER — WARFARIN 4 MG TABLET
Freq: Every evening | ORAL | 0 refills | 0.00000 days
Start: 2017-05-13 — End: 2017-05-13

## 2017-05-17 MED ORDER — TICAGRELOR 90 MG TABLET
ORAL_TABLET | Freq: Two times a day (BID) | ORAL | 0 refills | 0.00000 days | Status: CP
Start: 2017-05-17 — End: 2017-05-17

## 2017-05-17 MED ORDER — MELATONIN 3 MG TABLET
Freq: Every evening | ORAL | 0 refills | 0.00000 days | Status: CP
Start: 2017-05-17 — End: 2017-05-17

## 2017-05-17 MED ORDER — DICLOFENAC 1 % TOPICAL GEL
Freq: Three times a day (TID) | TOPICAL | 0 refills | 0.00000 days
Start: 2017-05-17 — End: 2017-06-04

## 2017-05-17 MED ORDER — WARFARIN 6 MG TABLET: each | 0 refills | 0 days

## 2017-05-17 MED ORDER — INSULIN NPH ISOPHANE U-100 HUMAN 100 UNIT/ML SUBCUTANEOUS SUSPENSION: 12 [IU] | mL | Freq: Two times a day (BID) | 0 refills | 0 days | Status: AC

## 2017-05-17 MED ORDER — SODIUM BICARBONATE 650 MG TABLET: tablet | 0 refills | 0 days

## 2017-05-17 MED ORDER — CHOLECALCIFEROL (VITAMIN D3) 125 MCG (5,000 UNIT) CAPSULE
ORAL_CAPSULE | 0 refills | 0 days | Status: SS
Start: 2017-05-17 — End: 2018-05-23

## 2017-05-17 MED ORDER — WARFARIN 6 MG TABLET
ORAL_TABLET | Freq: Every day | ORAL | 0 refills | 0.00000 days | Status: CP
Start: 2017-05-17 — End: 2017-06-04

## 2017-05-17 MED ORDER — ACETAMINOPHEN 500 MG TABLET: 1000 mg | each | 0 refills | 0 days

## 2017-05-17 MED ORDER — ACETAMINOPHEN 500 MG TABLET
ORAL_TABLET | Freq: Three times a day (TID) | ORAL | 0 refills | 0.00000 days | Status: CP
Start: 2017-05-17 — End: 2017-06-15

## 2017-05-17 MED ORDER — INSULIN NPH ISOPHANE U-100 HUMAN 100 UNIT/ML SUBCUTANEOUS SUSPENSION
Freq: Two times a day (BID) | SUBCUTANEOUS | 0 refills | 0.00000 days | Status: CP
Start: 2017-05-17 — End: 2017-06-04

## 2017-05-17 MED ORDER — MAGNESIUM OXIDE 400 MG (241.3 MG MAGNESIUM) TABLET
ORAL_TABLET | Freq: Two times a day (BID) | ORAL | 0 refills | 0.00000 days | Status: CP
Start: 2017-05-17 — End: 2017-05-17

## 2017-05-17 MED ORDER — TICAGRELOR 90 MG TABLET: 90 mg | tablet | 0 refills | 0 days

## 2017-05-17 MED ORDER — SODIUM BICARBONATE 650 MG TABLET
ORAL_TABLET | Freq: Four times a day (QID) | ORAL | 0 refills | 0.00000 days | Status: CP
Start: 2017-05-17 — End: 2017-05-17

## 2017-05-17 MED ORDER — FLUOXETINE 20 MG CAPSULE
ORAL_CAPSULE | 0 refills | 0 days
Start: 2017-05-17 — End: 2017-05-17

## 2017-05-17 MED ORDER — MELATONIN 3 MG TABLET: 3 mg | each | 0 refills | 0 days

## 2017-05-17 MED ORDER — NICOTINE (POLACRILEX) 4 MG GUM: each | 0 refills | 0 days | Status: SS

## 2017-05-17 MED ORDER — SODIUM POLYSTYRENE SULFONATE 15 GRAM-SORBITOL 20 GRAM/60 ML ORAL SUSP
5 refills | 0 days
Start: 2017-05-17 — End: 2017-12-07

## 2017-05-17 MED ORDER — MAGNESIUM OXIDE 400 MG (241.3 MG MAGNESIUM) TABLET: 400 mg | tablet | Freq: Two times a day (BID) | 0 refills | 0 days | Status: AC

## 2017-05-17 MED ORDER — ATORVASTATIN 80 MG TABLET: 80 mg | tablet | Freq: Every evening | 0 refills | 0 days | Status: AC

## 2017-05-17 MED ORDER — AMIODARONE 200 MG TABLET
ORAL_TABLET | ORAL | 0 refills | 0 days
Start: 2017-05-17 — End: 2017-05-17

## 2017-05-17 MED ORDER — RENVELA 800 MG TABLET
ORAL_TABLET | ORAL | 0 refills | 0 days
Start: 2017-05-17 — End: 2017-05-17

## 2017-05-17 MED ORDER — NICOTINE (POLACRILEX) 4 MG GUM
BUCCAL | 0 refills | 0.00000 days | Status: CP | PRN
Start: 2017-05-17 — End: 2017-05-17

## 2017-05-17 MED ORDER — BUMETANIDE 2 MG TABLET
ORAL_TABLET | ORAL | 0 refills | 0 days
Start: 2017-05-17 — End: 2017-05-17

## 2017-05-17 MED ORDER — SEVELAMER CARBONATE 800 MG TABLET
ORAL_TABLET | Freq: Three times a day (TID) | ORAL | 0 refills | 0 days | Status: CP
Start: 2017-05-17 — End: 2017-06-15

## 2017-05-17 MED ORDER — EZETIMIBE 10 MG TABLET
ORAL_TABLET | ORAL | 0 refills | 0 days
Start: 2017-05-17 — End: 2017-05-17

## 2017-05-17 MED ORDER — FAMOTIDINE 20 MG TABLET
ORAL | 0 refills | 0 days
Start: 2017-05-17 — End: 2017-05-17

## 2017-05-17 MED ORDER — ATORVASTATIN 80 MG TABLET
Freq: Every evening | ORAL | 0 refills | 0.00000 days | Status: CP
Start: 2017-05-17 — End: 2017-05-17

## 2017-05-17 MED ORDER — SODIUM POLYSTYRENE SULFONATE 15 GRAM/60 ML ORAL SUSPENSION
Freq: Every day | ORAL | 0 refills | 0 days | Status: CP
Start: 2017-05-17 — End: 2017-06-15

## 2017-05-18 MED ORDER — BUMETANIDE 2 MG TABLET
ORAL_TABLET | Freq: Every day | ORAL | 0 refills | 0 days | Status: CP
Start: 2017-05-18 — End: 2017-06-15

## 2017-05-18 MED ORDER — BLOOD SUGAR DIAGNOSTIC STRIPS
ORAL_STRIP | Freq: Three times a day (TID) | 0 refills | 0.00000 days | Status: CP
Start: 2017-05-18 — End: 2017-08-16

## 2017-05-18 MED ORDER — CHOLECALCIFEROL (VITAMIN D3) 125 MCG (5,000 UNIT) TABLET
ORAL_TABLET | Freq: Every day | ORAL | 0 refills | 0 days | Status: CP
Start: 2017-05-18 — End: 2017-06-15

## 2017-05-18 MED ORDER — INSULIN SYRINGE U-100 WITH NEEDLE 0.3 ML 31 GAUGE X 5/16" (8 MM)
0 refills | 0 days | Status: SS
Start: 2017-05-18 — End: 2018-05-23

## 2017-05-18 MED ORDER — FAMOTIDINE 20 MG TABLET
ORAL_TABLET | Freq: Every day | ORAL | 0 refills | 0.00000 days | Status: CP
Start: 2017-05-18 — End: 2017-06-15

## 2017-05-18 MED ORDER — EZETIMIBE 10 MG TABLET
ORAL_TABLET | Freq: Every day | ORAL | 0 refills | 0 days | Status: CP
Start: 2017-05-18 — End: 2017-06-15

## 2017-05-18 MED ORDER — AMIODARONE 200 MG TABLET
ORAL_TABLET | Freq: Every day | ORAL | 0 refills | 0 days | Status: CP
Start: 2017-05-18 — End: 2017-06-15

## 2017-05-18 MED ORDER — LANCETS
Freq: Three times a day (TID) | 0 refills | 0.00000 days | Status: CP
Start: 2017-05-18 — End: 2017-06-15

## 2017-05-18 MED ORDER — FLUOXETINE 20 MG CAPSULE
ORAL_CAPSULE | Freq: Every day | ORAL | 0 refills | 0.00000 days | Status: CP
Start: 2017-05-18 — End: 2017-06-15

## 2017-05-18 MED FILL — AMIODARONE HCL/200MG/TABS: AMIODARONE HCL/200MG/TABS | 30 days supply | Qty: 30 | Fill #0

## 2017-05-18 MED FILL — BRILINTA/90MG/TABS: BRILINTA/90MG/TABS | 30 days supply | Qty: 60 | Fill #0

## 2017-05-18 MED FILL — NICOTINE POLACRILEX GUM 4 MG/4MG/GUM: NICOTINE POLACRILEX GUM 4 MG/4MG/GUM | 4 days supply | Qty: 110 | Fill #0

## 2017-05-18 MED FILL — RENVELA/800MG/TABS: RENVELA/800MG/TABS | 30 days supply | Qty: 180 | Fill #0

## 2017-05-18 MED FILL — FAMOTIDINE/20MG/TABS: FAMOTIDINE/20MG/TABS | 30 days supply | Qty: 30 | Fill #0

## 2017-05-18 MED FILL — EZETIMIBE/10MG/TABS: EZETIMIBE/10MG/TABS | 30 days supply | Qty: 30 | Fill #0

## 2017-05-18 MED FILL — FLUOXETINE/20MG/CAPS: FLUOXETINE/20MG/CAPS | 30 days supply | Qty: 90 | Fill #0

## 2017-05-18 MED FILL — SOD POLYSTYRENE/15G/60ML/SUSP: SOD POLYSTYRENE/15G/60ML/SUSP | 20 days supply | Qty: 1200 | Fill #0

## 2017-05-18 MED FILL — ATORVASTATIN CALCIUM/80MG/TABS: ATORVASTATIN CALCIUM/80MG/TABS | 30 days supply | Qty: 30 | Fill #0

## 2017-05-18 MED FILL — SODIUM BICARBONATE/650MG/TABS: SODIUM BICARBONATE/650MG/TABS | 25 days supply | Qty: 100 | Fill #0

## 2017-05-18 MED FILL — VITAMIN D3/5000UNIT/CAPS: VITAMIN D3/5000UNIT/CAPS | 100 days supply | Qty: 100 | Fill #0

## 2017-05-18 MED FILL — ACETAMINOPHEN/500MG/TABS: ACETAMINOPHEN/500MG/TABS | 30 days supply | Qty: 180 | Fill #0

## 2017-05-18 MED FILL — NOVOLIN N/100U/ML/INJ: NOVOLIN N/100U/ML/INJ | 41 days supply | Qty: 1 | Fill #0

## 2017-05-18 MED FILL — MELATONIN/3MG/TABS: MELATONIN/3MG/TABS | 100 days supply | Qty: 100 | Fill #0

## 2017-05-18 MED FILL — WARFARIN/6MG/TAB: WARFARIN/6MG/TAB | 30 days supply | Qty: 60 | Fill #0

## 2017-05-18 MED FILL — BUMETANIDE/2MG/TAB: BUMETANIDE/2MG/TAB | 30 days supply | Qty: 30 | Fill #0

## 2017-05-18 MED FILL — GNP INSULIN SYRINGE/0.3/31G/MISC: GNP INSULIN SYRINGE/0.3/31G/MISC | 30 days supply | Qty: 60 | Fill #0

## 2017-05-18 MED FILL — MAGNESIUM OXIDE/400MG/TABS: MAGNESIUM OXIDE/400MG/TABS | 60 days supply | Qty: 120 | Fill #0

## 2017-05-25 ENCOUNTER — Ambulatory Visit: Admit: 2017-05-25 | Discharge: 2017-05-26 | Payer: MEDICARE

## 2017-05-25 DIAGNOSIS — I495 Sick sinus syndrome: Secondary | ICD-10-CM

## 2017-05-25 DIAGNOSIS — I2510 Atherosclerotic heart disease of native coronary artery without angina pectoris: Principal | ICD-10-CM

## 2017-05-26 MED ORDER — BLOOD SUGAR DIAGNOSTIC STRIPS
ORAL_STRIP | Freq: Four times a day (QID) | 3 refills | 0.00000 days | Status: CP
Start: 2017-05-26 — End: 2017-05-26

## 2017-05-26 MED ORDER — BLOOD SUGAR DIAGNOSTIC STRIPS: strip | Freq: Four times a day (QID) | 3 refills | 0 days | Status: AC

## 2017-06-02 ENCOUNTER — Ambulatory Visit
Admit: 2017-06-02 | Discharge: 2017-06-03 | Payer: MEDICARE | Attending: Student in an Organized Health Care Education/Training Program | Primary: Student in an Organized Health Care Education/Training Program

## 2017-06-02 DIAGNOSIS — I10 Essential (primary) hypertension: Principal | ICD-10-CM

## 2017-06-02 MED ORDER — HYDRALAZINE 25 MG TABLET
ORAL_TABLET | Freq: Three times a day (TID) | ORAL | 1 refills | 0 days | Status: CP
Start: 2017-06-02 — End: 2017-10-28

## 2017-06-02 MED FILL — HYDRALAZINE HCL/25MG/TABS: HYDRALAZINE HCL/25MG/TABS | 30 days supply | Qty: 90 | Fill #0

## 2017-06-04 ENCOUNTER — Ambulatory Visit: Admit: 2017-06-04 | Discharge: 2017-06-04 | Payer: MEDICARE

## 2017-06-04 DIAGNOSIS — I480 Paroxysmal atrial fibrillation: Secondary | ICD-10-CM

## 2017-06-04 DIAGNOSIS — I25110 Atherosclerotic heart disease of native coronary artery with unstable angina pectoris: Secondary | ICD-10-CM

## 2017-06-04 DIAGNOSIS — N182 Chronic kidney disease, stage 2 (mild): Secondary | ICD-10-CM

## 2017-06-04 DIAGNOSIS — I824Y3 Acute embolism and thrombosis of unspecified deep veins of proximal lower extremity, bilateral: Secondary | ICD-10-CM

## 2017-06-04 DIAGNOSIS — Z794 Long term (current) use of insulin: Secondary | ICD-10-CM

## 2017-06-04 DIAGNOSIS — N184 Chronic kidney disease, stage 4 (severe): Secondary | ICD-10-CM

## 2017-06-04 DIAGNOSIS — E1122 Type 2 diabetes mellitus with diabetic chronic kidney disease: Secondary | ICD-10-CM

## 2017-06-04 DIAGNOSIS — I10 Essential (primary) hypertension: Secondary | ICD-10-CM

## 2017-06-04 DIAGNOSIS — E875 Hyperkalemia: Secondary | ICD-10-CM

## 2017-06-04 DIAGNOSIS — I5042 Chronic combined systolic (congestive) and diastolic (congestive) heart failure: Principal | ICD-10-CM

## 2017-06-04 MED ORDER — WARFARIN 6 MG TABLET
ORAL_TABLET | 0 refills | 0 days
Start: 2017-06-04 — End: 2017-06-15

## 2017-06-04 MED ORDER — VOLTAREN 1 % TOPICAL GEL
TOPICAL | 0 refills | 0 days
Start: 2017-06-04 — End: 2017-06-04

## 2017-06-04 MED ORDER — DICLOFENAC 1 % TOPICAL GEL
Freq: Three times a day (TID) | TOPICAL | 0 refills | 0 days | Status: CP
Start: 2017-06-04 — End: 2017-06-15

## 2017-06-04 MED ORDER — SODIUM BICARBONATE 650 MG TABLET
ORAL_TABLET | Freq: Two times a day (BID) | ORAL | 0 refills | 0 days
Start: 2017-06-04 — End: 2017-06-15

## 2017-06-04 MED ORDER — INSULIN NPH ISOPHANE U-100 HUMAN 100 UNIT/ML SUBCUTANEOUS SUSPENSION
0 refills | 0 days
Start: 2017-06-04 — End: 2017-06-16

## 2017-06-11 ENCOUNTER — Ambulatory Visit
Admit: 2017-06-11 | Discharge: 2017-06-12 | Payer: MEDICARE | Attending: Cardiovascular Disease | Primary: Cardiovascular Disease

## 2017-06-11 DIAGNOSIS — I5042 Chronic combined systolic (congestive) and diastolic (congestive) heart failure: Secondary | ICD-10-CM

## 2017-06-11 DIAGNOSIS — I824Y3 Acute embolism and thrombosis of unspecified deep veins of proximal lower extremity, bilateral: Secondary | ICD-10-CM

## 2017-06-11 DIAGNOSIS — I10 Essential (primary) hypertension: Secondary | ICD-10-CM

## 2017-06-11 DIAGNOSIS — I480 Paroxysmal atrial fibrillation: Secondary | ICD-10-CM

## 2017-06-11 DIAGNOSIS — I2510 Atherosclerotic heart disease of native coronary artery without angina pectoris: Principal | ICD-10-CM

## 2017-06-11 MED ORDER — CARVEDILOL 12.5 MG TABLET
ORAL_TABLET | Freq: Two times a day (BID) | ORAL | 6 refills | 0.00000 days | Status: CP
Start: 2017-06-11 — End: 2017-06-18

## 2017-06-11 MED ORDER — CARVEDILOL 12.5 MG TABLET: tablet | 6 refills | 0 days

## 2017-06-11 MED FILL — CARVEDILOL/12.5MG/TABS: CARVEDILOL/12.5MG/TABS | 30 days supply | Qty: 60 | Fill #0

## 2017-06-16 MED ORDER — LANCETS
Freq: Three times a day (TID) | 2 refills | 0 days | Status: CP
Start: 2017-06-16 — End: 2017-09-14

## 2017-06-16 MED ORDER — ACETAMINOPHEN 500 MG TABLET
ORAL_TABLET | Freq: Three times a day (TID) | ORAL | 0 refills | 0.00000 days | Status: CP
Start: 2017-06-16 — End: 2017-07-29

## 2017-06-16 MED ORDER — FAMOTIDINE 20 MG TABLET
ORAL_TABLET | Freq: Every day | ORAL | 3 refills | 0 days | Status: CP
Start: 2017-06-16 — End: 2017-06-16

## 2017-06-16 MED ORDER — HUMULIN N NPH U-100 INSULIN KWIKPEN 100 UNIT/ML (3 ML) SUBCUTANEOUS
5 refills | 0 days
Start: 2017-06-16 — End: 2017-12-04

## 2017-06-16 MED ORDER — ACETAMINOPHEN 500 MG TABLET: 1000 mg | tablet | Freq: Three times a day (TID) | 0 refills | 0 days | Status: AC

## 2017-06-16 MED ORDER — MELATONIN 3 MG TABLET
ORAL_TABLET | Freq: Every evening | ORAL | 0 refills | 0 days | Status: CP
Start: 2017-06-16 — End: 2017-07-16

## 2017-06-16 MED ORDER — BUMETANIDE 2 MG TABLET
ORAL_TABLET | Freq: Every day | ORAL | 3 refills | 0.00000 days | Status: CP
Start: 2017-06-16 — End: 2018-03-18

## 2017-06-16 MED ORDER — ATORVASTATIN 80 MG TABLET: 80 mg | tablet | 3 refills | 0 days

## 2017-06-16 MED ORDER — WARFARIN 6 MG TABLET
0 refills | 0.00000 days | Status: CP
Start: 2017-06-16 — End: 2017-06-16

## 2017-06-16 MED ORDER — SODIUM POLYSTYRENE SULFONATE 15 GRAM-SORBITOL 20 GRAM/60 ML ORAL SUSP
0 refills | 0 days | Status: SS
Start: 2017-06-16 — End: 2017-11-24

## 2017-06-16 MED ORDER — EZETIMIBE 10 MG TABLET
Freq: Every day | ORAL | 0 refills | 0.00000 days | Status: CP
Start: 2017-06-16 — End: 2017-06-16

## 2017-06-16 MED ORDER — MAGNESIUM OXIDE 400 MG (241.3 MG MAGNESIUM) TABLET: 400 mg | tablet | Freq: Two times a day (BID) | 0 refills | 0 days | Status: AC

## 2017-06-16 MED ORDER — SEVELAMER CARBONATE 800 MG TABLET: 1600 mg | tablet | Freq: Three times a day (TID) | 0 refills | 0 days | Status: AC

## 2017-06-16 MED ORDER — MAGNESIUM OXIDE 400 MG (241.3 MG MAGNESIUM) TABLET
ORAL_TABLET | Freq: Two times a day (BID) | ORAL | 0 refills | 0.00000 days | Status: CP
Start: 2017-06-16 — End: 2017-06-16

## 2017-06-16 MED ORDER — FLUOXETINE 20 MG CAPSULE: 60 mg | capsule | Freq: Every day | 3 refills | 0 days | Status: AC

## 2017-06-16 MED ORDER — EZETIMIBE 10 MG TABLET: 10 mg | each | 0 refills | 0 days

## 2017-06-16 MED ORDER — LANCETS 30 GAUGE
2 refills | 0 days
Start: 2017-06-16 — End: 2018-08-02

## 2017-06-16 MED ORDER — INSULIN NPH ISOPHANE U-100 HUMAN 100 UNIT/ML SUBCUTANEOUS SUSPENSION
3 refills | 0 days | Status: CP
Start: 2017-06-16 — End: 2017-12-09

## 2017-06-16 MED ORDER — TICAGRELOR 90 MG TABLET
ORAL_TABLET | Freq: Two times a day (BID) | ORAL | 3 refills | 0.00000 days | Status: CP
Start: 2017-06-16 — End: 2018-06-10

## 2017-06-16 MED ORDER — SEVELAMER CARBONATE 800 MG TABLET
ORAL_TABLET | Freq: Three times a day (TID) | ORAL | 0 refills | 0.00000 days | Status: CP
Start: 2017-06-16 — End: 2017-06-19

## 2017-06-16 MED ORDER — DICLOFENAC 1 % TOPICAL GEL
Freq: Three times a day (TID) | TOPICAL | 0 refills | 0 days | Status: CP
Start: 2017-06-16 — End: 2017-10-20

## 2017-06-16 MED ORDER — BUMETANIDE 2 MG TABLET: 2 mg | tablet | Freq: Every day | 3 refills | 0 days | Status: AC

## 2017-06-16 MED ORDER — FLUOXETINE 20 MG CAPSULE
ORAL_CAPSULE | Freq: Every day | ORAL | 3 refills | 0.00000 days | Status: CP
Start: 2017-06-16 — End: 2017-06-16

## 2017-06-16 MED ORDER — TICAGRELOR 90 MG TABLET: 90 mg | tablet | 3 refills | 0 days | Status: AC

## 2017-06-16 MED ORDER — AMIODARONE 200 MG TABLET
ORAL_TABLET | Freq: Every day | ORAL | 3 refills | 0 days | Status: CP
Start: 2017-06-16 — End: 2017-06-16

## 2017-06-16 MED ORDER — AMIODARONE 200 MG TABLET: 200 mg | tablet | 3 refills | 0 days | Status: AC

## 2017-06-16 MED ORDER — FAMOTIDINE 20 MG TABLET: 20 mg | tablet | 3 refills | 0 days | Status: SS

## 2017-06-16 MED ORDER — WARFARIN 6 MG TABLET: each | 0 refills | 0 days

## 2017-06-16 MED ORDER — SODIUM BICARBONATE 650 MG TABLET
ORAL_TABLET | Freq: Every day | ORAL | 0 refills | 0.00000 days
Start: 2017-06-16 — End: 2017-07-23

## 2017-06-16 MED ORDER — ATORVASTATIN 80 MG TABLET
ORAL_TABLET | Freq: Every evening | ORAL | 3 refills | 0.00000 days | Status: CP
Start: 2017-06-16 — End: 2017-06-16

## 2017-06-16 MED ORDER — SODIUM POLYSTYRENE SULFONATE 15 GRAM/60 ML ORAL SUSPENSION
Freq: Every day | ORAL | 0 refills | 0 days | Status: CP
Start: 2017-06-16 — End: 2017-07-16

## 2017-06-16 MED ORDER — CHOLECALCIFEROL (VITAMIN D3) 125 MCG (5,000 UNIT) TABLET
ORAL_TABLET | Freq: Every day | ORAL | 3 refills | 0 days | Status: SS
Start: 2017-06-16 — End: 2017-11-24

## 2017-06-17 MED FILL — EZETIMIBE/10MG/TABS: EZETIMIBE/10MG/TABS | 30 days supply | Qty: 30 | Fill #0

## 2017-06-17 MED FILL — FAMOTIDINE/20MG/TABS: FAMOTIDINE/20MG/TABS | 90 days supply | Qty: 90 | Fill #0

## 2017-06-17 MED FILL — AMIODARONE HCL/200MG/TABS: AMIODARONE HCL/200MG/TABS | 90 days supply | Qty: 90 | Fill #0

## 2017-06-17 MED FILL — RENVELA/800MG/TABS: RENVELA/800MG/TABS | 30 days supply | Qty: 180 | Fill #0

## 2017-06-17 MED FILL — ACETAMINOPHEN EXTRA STREN/500MG/TABS: ACETAMINOPHEN EXTRA STREN/500MG/TABS | 30 days supply | Qty: 180 | Fill #0

## 2017-06-17 MED FILL — BUMETANIDE/2MG/TAB: BUMETANIDE/2MG/TAB | 90 days supply | Qty: 90 | Fill #0

## 2017-06-17 MED FILL — WARFARIN/6MG/TAB: WARFARIN/6MG/TAB | 90 days supply | Qty: 218 | Fill #0

## 2017-06-17 MED FILL — +ATORVASTATIN CALCIUM/80MG/TABS: +ATORVASTATIN CALCIUM/80MG/TABS | 90 days supply | Qty: 90 | Fill #0

## 2017-06-17 MED FILL — MAGNESIUM OXIDE/400MG/TABS: MAGNESIUM OXIDE/400MG/TABS | 30 days supply | Qty: 60 | Fill #0

## 2017-06-17 MED FILL — BRILINTA/90MG/TABS: BRILINTA/90MG/TABS | 90 days supply | Qty: 180 | Fill #0

## 2017-06-17 MED FILL — FLUOXETINE HCL/20MG/CAPS: FLUOXETINE HCL/20MG/CAPS | 30 days supply | Qty: 90 | Fill #0

## 2017-06-17 MED FILL — HUMULIN N KWIKPEN/100 UN/ML/SUPN: HUMULIN N KWIKPEN/100 UN/ML/SUPN | 68 days supply | Qty: 1 | Fill #0

## 2017-06-18 MED ORDER — CARVEDILOL 25 MG TABLET
Freq: Two times a day (BID) | ORAL | 0 days
Start: 2017-06-18 — End: 2017-07-03

## 2017-06-19 ENCOUNTER — Ambulatory Visit: Admit: 2017-06-19 | Discharge: 2017-06-20 | Payer: MEDICARE

## 2017-06-19 DIAGNOSIS — I824Y3 Acute embolism and thrombosis of unspecified deep veins of proximal lower extremity, bilateral: Secondary | ICD-10-CM

## 2017-06-19 DIAGNOSIS — N184 Chronic kidney disease, stage 4 (severe): Principal | ICD-10-CM

## 2017-06-19 MED FILL — BD NANO PEN NEEDLE/32G/4MM/NDL: BD NANO PEN NEEDLE/32G/4MM/NDL | 20 days supply | Qty: 1 | Fill #0

## 2017-06-20 MED ORDER — FLUOXETINE 20 MG CAPSULE: 60 mg | capsule | Freq: Every day | 3 refills | 0 days | Status: AC

## 2017-06-20 MED ORDER — FLUOXETINE 20 MG CAPSULE
ORAL_CAPSULE | Freq: Every day | ORAL | 3 refills | 0.00000 days | Status: CP
Start: 2017-06-20 — End: 2017-06-20

## 2017-06-20 MED ORDER — MAGNESIUM OXIDE 400 MG (241.3 MG MAGNESIUM) TABLET
ORAL_TABLET | Freq: Two times a day (BID) | ORAL | 3 refills | 0.00000 days | Status: CP
Start: 2017-06-20 — End: 2017-06-20

## 2017-06-20 MED ORDER — SEVELAMER CARBONATE 800 MG TABLET: tablet | 3 refills | 0 days | Status: AC

## 2017-06-20 MED ORDER — EZETIMIBE 10 MG TABLET
ORAL_TABLET | Freq: Every day | ORAL | 3 refills | 0.00000 days | Status: CP
Start: 2017-06-20 — End: 2017-06-20

## 2017-06-20 MED ORDER — MAGNESIUM OXIDE 400 MG (241.3 MG MAGNESIUM) TABLET: 400 mg | tablet | Freq: Two times a day (BID) | 3 refills | 0 days | Status: AC

## 2017-06-20 MED ORDER — SEVELAMER CARBONATE 800 MG TABLET
ORAL_TABLET | Freq: Three times a day (TID) | ORAL | 3 refills | 0.00000 days | Status: CP
Start: 2017-06-20 — End: 2017-06-20

## 2017-06-20 MED ORDER — EZETIMIBE 10 MG TABLET: 10 mg | tablet | Freq: Every day | 3 refills | 0 days | Status: AC

## 2017-07-01 ENCOUNTER — Emergency Department: Admit: 2017-07-01 | Discharge: 2017-07-02 | Disposition: A | Discharge status: Home / Self Care | Payer: MEDICARE

## 2017-07-01 ENCOUNTER — Ambulatory Visit: Admit: 2017-07-01 | Discharge: 2017-07-02 | Disposition: A | Discharge status: Home / Self Care | Payer: MEDICARE

## 2017-07-01 DIAGNOSIS — I480 Paroxysmal atrial fibrillation: Principal | ICD-10-CM

## 2017-07-03 ENCOUNTER — Ambulatory Visit: Admit: 2017-07-03 | Discharge: 2017-07-04 | Payer: MEDICARE | Attending: Adult Health | Primary: Adult Health

## 2017-07-03 DIAGNOSIS — I480 Paroxysmal atrial fibrillation: Secondary | ICD-10-CM

## 2017-07-03 DIAGNOSIS — I2510 Atherosclerotic heart disease of native coronary artery without angina pectoris: Principal | ICD-10-CM

## 2017-07-03 MED ORDER — CARVEDILOL 25 MG TABLET: tablet | 0 refills | 0 days | Status: AC

## 2017-07-03 MED ORDER — CARVEDILOL 25 MG TABLET
ORAL_TABLET | ORAL | 0 refills | 0.00000 days | Status: CP
Start: 2017-07-03 — End: 2017-09-14

## 2017-07-16 ENCOUNTER — Ambulatory Visit: Admit: 2017-07-16 | Discharge: 2017-07-16 | Payer: MEDICARE

## 2017-07-16 DIAGNOSIS — N184 Chronic kidney disease, stage 4 (severe): Secondary | ICD-10-CM

## 2017-07-16 DIAGNOSIS — N182 Chronic kidney disease, stage 2 (mild): Secondary | ICD-10-CM

## 2017-07-16 DIAGNOSIS — I824Y3 Acute embolism and thrombosis of unspecified deep veins of proximal lower extremity, bilateral: Secondary | ICD-10-CM

## 2017-07-16 DIAGNOSIS — I5042 Chronic combined systolic (congestive) and diastolic (congestive) heart failure: Secondary | ICD-10-CM

## 2017-07-16 DIAGNOSIS — Z794 Long term (current) use of insulin: Secondary | ICD-10-CM

## 2017-07-16 DIAGNOSIS — E1122 Type 2 diabetes mellitus with diabetic chronic kidney disease: Secondary | ICD-10-CM

## 2017-07-16 DIAGNOSIS — O2230 Deep phlebothrombosis in pregnancy, unspecified trimester: Secondary | ICD-10-CM

## 2017-07-16 DIAGNOSIS — I10 Essential (primary) hypertension: Secondary | ICD-10-CM

## 2017-07-16 DIAGNOSIS — I480 Paroxysmal atrial fibrillation: Principal | ICD-10-CM

## 2017-07-16 MED ORDER — MELATONIN 3 MG TABLET
ORAL_TABLET | Freq: Every evening | ORAL | 0 refills | 0.00000 days | Status: SS | PRN
Start: 2017-07-16 — End: 2018-03-12

## 2017-07-17 MED FILL — RENVELA/800MG/TABS: RENVELA/800MG/TABS | 90 days supply | Qty: 540 | Fill #0

## 2017-07-17 MED FILL — EZETIMIBE/10MG/TABS: EZETIMIBE/10MG/TABS | 90 days supply | Qty: 90 | Fill #0

## 2017-07-17 MED FILL — BD NANO PEN NEEDLE/32G/4MM/NDL: BD NANO PEN NEEDLE/32G/4MM/NDL | 20 days supply | Qty: 1 | Fill #1

## 2017-07-23 ENCOUNTER — Ambulatory Visit: Admit: 2017-07-23 | Discharge: 2017-07-24

## 2017-07-23 ENCOUNTER — Ambulatory Visit
Admit: 2017-07-23 | Discharge: 2017-07-24 | Payer: MEDICARE | Attending: Cardiovascular Disease | Primary: Cardiovascular Disease

## 2017-07-23 DIAGNOSIS — I824Y3 Acute embolism and thrombosis of unspecified deep veins of proximal lower extremity, bilateral: Secondary | ICD-10-CM

## 2017-07-23 DIAGNOSIS — I480 Paroxysmal atrial fibrillation: Secondary | ICD-10-CM

## 2017-07-23 DIAGNOSIS — I2510 Atherosclerotic heart disease of native coronary artery without angina pectoris: Principal | ICD-10-CM

## 2017-07-23 DIAGNOSIS — I10 Essential (primary) hypertension: Secondary | ICD-10-CM

## 2017-07-23 MED ORDER — AMLODIPINE 10 MG TABLET
ORAL_TABLET | Freq: Every day | ORAL | 6 refills | 0 days | Status: CP
Start: 2017-07-23 — End: 2017-08-28

## 2017-07-23 MED FILL — AMLODIPINE/10MG/TABS: AMLODIPINE/10MG/TABS | 30 days supply | Qty: 30 | Fill #0

## 2017-07-28 ENCOUNTER — Institutional Professional Consult (permissible substitution): Admit: 2017-07-28 | Discharge: 2017-07-29 | Payer: MEDICARE

## 2017-07-28 DIAGNOSIS — I5042 Chronic combined systolic (congestive) and diastolic (congestive) heart failure: Principal | ICD-10-CM

## 2017-07-29 ENCOUNTER — Ambulatory Visit: Admit: 2017-07-29 | Discharge: 2017-07-30 | Payer: MEDICARE | Attending: Internal Medicine | Primary: Internal Medicine

## 2017-07-29 DIAGNOSIS — T8130XA Disruption of wound, unspecified, initial encounter: Secondary | ICD-10-CM

## 2017-07-29 DIAGNOSIS — I480 Paroxysmal atrial fibrillation: Secondary | ICD-10-CM

## 2017-07-29 DIAGNOSIS — I824Y3 Acute embolism and thrombosis of unspecified deep veins of proximal lower extremity, bilateral: Secondary | ICD-10-CM

## 2017-07-29 DIAGNOSIS — Z7901 Long term (current) use of anticoagulants: Principal | ICD-10-CM

## 2017-07-29 MED ORDER — ATORVASTATIN 80 MG TABLET
ORAL_TABLET | Freq: Every day | ORAL | 3 refills | 0 days
Start: 2017-07-29 — End: 2018-03-03

## 2017-07-29 MED FILL — FLUOXETINE HCL/20MG/CAPS: FLUOXETINE HCL/20MG/CAPS | 30 days supply | Qty: 90 | Fill #1

## 2017-07-30 MED ORDER — ACETAMINOPHEN 500 MG TABLET: 1000 mg | tablet | Freq: Three times a day (TID) | 2 refills | 0 days | Status: AC

## 2017-07-30 MED ORDER — ACETAMINOPHEN 500 MG TABLET
Freq: Three times a day (TID) | ORAL | 2 refills | 0.00000 days | Status: CP | PRN
Start: 2017-07-30 — End: 2018-02-01

## 2017-08-04 MED FILL — BD NANO PEN NEEDLE/32G/4MM/NDL: BD NANO PEN NEEDLE/32G/4MM/NDL | 20 days supply | Qty: 1 | Fill #2

## 2017-08-12 ENCOUNTER — Ambulatory Visit: Admit: 2017-08-12 | Discharge: 2017-08-12 | Payer: MEDICARE

## 2017-08-12 ENCOUNTER — Ambulatory Visit: Admit: 2017-08-12 | Discharge: 2017-08-12 | Payer: MEDICARE | Attending: Clinical | Primary: Clinical

## 2017-08-12 DIAGNOSIS — I2510 Atherosclerotic heart disease of native coronary artery without angina pectoris: Secondary | ICD-10-CM

## 2017-08-12 DIAGNOSIS — Z794 Long term (current) use of insulin: Secondary | ICD-10-CM

## 2017-08-12 DIAGNOSIS — N184 Chronic kidney disease, stage 4 (severe): Secondary | ICD-10-CM

## 2017-08-12 DIAGNOSIS — I824Y3 Acute embolism and thrombosis of unspecified deep veins of proximal lower extremity, bilateral: Secondary | ICD-10-CM

## 2017-08-12 DIAGNOSIS — N182 Chronic kidney disease, stage 2 (mild): Secondary | ICD-10-CM

## 2017-08-12 DIAGNOSIS — I10 Essential (primary) hypertension: Secondary | ICD-10-CM

## 2017-08-12 DIAGNOSIS — F4320 Adjustment disorder, unspecified: Principal | ICD-10-CM

## 2017-08-12 DIAGNOSIS — E1122 Type 2 diabetes mellitus with diabetic chronic kidney disease: Principal | ICD-10-CM

## 2017-08-21 MED FILL — BD NANO PEN NEEDLE/32G/4MM/NDL: BD NANO PEN NEEDLE/32G/4MM/NDL | 20 days supply | Qty: 1 | Fill #3

## 2017-08-26 MED FILL — FLUOXETINE HCL/20MG/CAPS: FLUOXETINE HCL/20MG/CAPS | 30 days supply | Qty: 90 | Fill #2

## 2017-08-28 ENCOUNTER — Ambulatory Visit: Admit: 2017-08-28 | Discharge: 2017-08-29 | Payer: MEDICARE

## 2017-08-28 DIAGNOSIS — I480 Paroxysmal atrial fibrillation: Principal | ICD-10-CM

## 2017-08-28 DIAGNOSIS — I10 Essential (primary) hypertension: Secondary | ICD-10-CM

## 2017-08-28 DIAGNOSIS — I5042 Chronic combined systolic (congestive) and diastolic (congestive) heart failure: Secondary | ICD-10-CM

## 2017-08-28 DIAGNOSIS — R791 Abnormal coagulation profile: Secondary | ICD-10-CM

## 2017-08-28 DIAGNOSIS — Z794 Long term (current) use of insulin: Secondary | ICD-10-CM

## 2017-08-28 DIAGNOSIS — E1122 Type 2 diabetes mellitus with diabetic chronic kidney disease: Secondary | ICD-10-CM

## 2017-08-28 DIAGNOSIS — I824Y3 Acute embolism and thrombosis of unspecified deep veins of proximal lower extremity, bilateral: Secondary | ICD-10-CM

## 2017-08-28 DIAGNOSIS — N184 Chronic kidney disease, stage 4 (severe): Secondary | ICD-10-CM

## 2017-08-28 MED ORDER — AMLODIPINE 10 MG TABLET
ORAL_TABLET | Freq: Every day | ORAL | 0 refills | 0 days | Status: CP
Start: 2017-08-28 — End: 2017-09-14

## 2017-08-28 MED ORDER — WARFARIN 6 MG TABLET
Freq: Every day | ORAL | 4 refills | 0.00000 days | Status: CP
Start: 2017-08-28 — End: 2017-08-28

## 2017-08-28 MED ORDER — WARFARIN 6 MG TABLET: 12 mg | tablet | Freq: Every day | 0 refills | 0 days | Status: AC

## 2017-08-28 MED FILL — AMLODIPINE/10MG/TABS: AMLODIPINE/10MG/TABS | 30 days supply | Qty: 30 | Fill #0

## 2017-09-07 MED FILL — WARFARIN/6MG/TAB: WARFARIN/6MG/TAB | 30 days supply | Qty: 60 | Fill #0

## 2017-09-08 MED ORDER — UNIFINE PENTIPS 31 GAUGE X 5/16" NEEDLE
3 refills | 0 days | Status: CP
Start: 2017-09-08 — End: 2018-06-10
  Filled 2017-12-07: qty 400, 80d supply, fill #0

## 2017-09-08 MED ORDER — PEN NEEDLE, DIABETIC 31 GAUGE X 5/16" (8 MM)
3 refills | 0 days | Status: CP
Start: 2017-09-08 — End: 2017-09-08

## 2017-09-09 MED FILL — UF SHORT PEN NEEDLE/31G-8MM/NDL: UF SHORT PEN NEEDLE/31G-8MM/NDL | 80 days supply | Qty: 4 | Fill #0

## 2017-09-14 MED ORDER — VOLTAREN 1 % TOPICAL GEL: 2 g | g | 99 refills | 0 days

## 2017-09-14 MED ORDER — AMLODIPINE 10 MG TABLET
ORAL_TABLET | Freq: Every day | ORAL | PRN refills | 0.00000 days | Status: CP
Start: 2017-09-14 — End: 2018-01-14
  Filled 2017-12-07: qty 30, 30d supply, fill #0

## 2017-09-14 MED ORDER — CARVEDILOL 25 MG TABLET: tablet | 0 refills | 0 days | Status: AC

## 2017-09-14 MED ORDER — CARVEDILOL 25 MG TABLET
ORAL_TABLET | PRN refills | 0.00000 days | Status: CP
Start: 2017-09-14 — End: 2017-09-14

## 2017-09-14 MED ORDER — VOLTAREN 1 % TOPICAL GEL
TOPICAL | PRN refills | 0.00000 days | Status: CP
Start: 2017-09-14 — End: 2017-09-18

## 2017-09-18 ENCOUNTER — Ambulatory Visit
Admit: 2017-09-18 | Discharge: 2017-09-19 | Payer: MEDICARE | Attending: Pharmacist Clinician (PhC)/ Clinical Pharmacy Specialist | Primary: Pharmacist Clinician (PhC)/ Clinical Pharmacy Specialist

## 2017-09-18 DIAGNOSIS — I824Y3 Acute embolism and thrombosis of unspecified deep veins of proximal lower extremity, bilateral: Secondary | ICD-10-CM

## 2017-09-18 DIAGNOSIS — I2510 Atherosclerotic heart disease of native coronary artery without angina pectoris: Principal | ICD-10-CM

## 2017-09-18 DIAGNOSIS — I480 Paroxysmal atrial fibrillation: Secondary | ICD-10-CM

## 2017-09-21 MED ORDER — WARFARIN 6 MG TABLET
4 refills | 0.00000 days | Status: CP
Start: 2017-09-21 — End: 2017-09-21

## 2017-09-21 MED ORDER — WARFARIN 6 MG TABLET: each | 4 refills | 0 days

## 2017-10-14 MED FILL — EZETIMIBE/10MG/TABS: EZETIMIBE/10MG/TABS | 90 days supply | Qty: 90 | Fill #1

## 2017-10-14 MED FILL — RENVELA/800MG/TABS: RENVELA/800MG/TABS | 90 days supply | Qty: 540 | Fill #1

## 2017-10-16 MED FILL — WARFARIN/6MG/TAB: WARFARIN/6MG/TAB | 90 days supply | Qty: 210 | Fill #0

## 2017-10-16 MED FILL — AMLODIPINE/10MG/TABS: AMLODIPINE/10MG/TABS | 30 days supply | Qty: 30 | Fill #0

## 2017-10-16 MED FILL — CARVEDILOL/25MG/TABS: CARVEDILOL/25MG/TABS | 90 days supply | Qty: 270 | Fill #0

## 2017-10-16 MED FILL — HUMULIN N KWIKPEN/100 UN/ML/SUPN: HUMULIN N KWIKPEN/100 UN/ML/SUPN | 68 days supply | Qty: 1 | Fill #2

## 2017-10-18 ENCOUNTER — Emergency Department
Admit: 2017-10-18 | Discharge: 2017-10-18 | Disposition: A | Discharge status: Home / Self Care | Payer: MEDICARE | Attending: General Practice

## 2017-10-18 ENCOUNTER — Ambulatory Visit
Admit: 2017-10-18 | Discharge: 2017-10-18 | Disposition: A | Discharge status: Home / Self Care | Payer: MEDICARE | Attending: General Practice

## 2017-10-18 DIAGNOSIS — R079 Chest pain, unspecified: Principal | ICD-10-CM

## 2017-10-20 ENCOUNTER — Ambulatory Visit: Admit: 2017-10-20 | Discharge: 2017-10-21 | Payer: MEDICARE

## 2017-10-20 DIAGNOSIS — Z794 Long term (current) use of insulin: Secondary | ICD-10-CM

## 2017-10-20 DIAGNOSIS — I10 Essential (primary) hypertension: Secondary | ICD-10-CM

## 2017-10-20 DIAGNOSIS — I480 Paroxysmal atrial fibrillation: Principal | ICD-10-CM

## 2017-10-20 DIAGNOSIS — N182 Chronic kidney disease, stage 2 (mild): Secondary | ICD-10-CM

## 2017-10-20 DIAGNOSIS — I824Y3 Acute embolism and thrombosis of unspecified deep veins of proximal lower extremity, bilateral: Secondary | ICD-10-CM

## 2017-10-20 DIAGNOSIS — E1122 Type 2 diabetes mellitus with diabetic chronic kidney disease: Secondary | ICD-10-CM

## 2017-10-20 MED ORDER — DICLOFENAC 1 % TOPICAL GEL: 2 g | g | Freq: Three times a day (TID) | 5 refills | 0 days | Status: AC

## 2017-10-20 MED ORDER — COLCHICINE 0.6 MG TABLET
ORAL_TABLET | Freq: Two times a day (BID) | ORAL | 2 refills | 0.00000 days | Status: SS
Start: 2017-10-20 — End: 2018-03-12

## 2017-10-20 MED ORDER — WARFARIN 6 MG TABLET: each | 3 refills | 0 days

## 2017-10-20 MED ORDER — WARFARIN 6 MG TABLET
3 refills | 0.00000 days | Status: CP
Start: 2017-10-20 — End: 2017-10-20

## 2017-10-20 MED ORDER — DICLOFENAC 1 % TOPICAL GEL
Freq: Three times a day (TID) | TOPICAL | 5 refills | 0.00000 days | Status: CP
Start: 2017-10-20 — End: 2018-05-12
  Filled 2017-12-07: qty 100, 17d supply, fill #0

## 2017-10-27 ENCOUNTER — Institutional Professional Consult (permissible substitution): Admit: 2017-10-27 | Discharge: 2017-10-28 | Payer: MEDICARE

## 2017-10-27 DIAGNOSIS — I5042 Chronic combined systolic (congestive) and diastolic (congestive) heart failure: Principal | ICD-10-CM

## 2017-10-27 DIAGNOSIS — Z4502 Encounter for adjustment and management of automatic implantable cardiac defibrillator: Secondary | ICD-10-CM

## 2017-10-28 ENCOUNTER — Ambulatory Visit: Admit: 2017-10-28 | Discharge: 2017-10-28 | Payer: MEDICARE

## 2017-10-28 DIAGNOSIS — I480 Paroxysmal atrial fibrillation: Principal | ICD-10-CM

## 2017-10-28 DIAGNOSIS — I10 Essential (primary) hypertension: Secondary | ICD-10-CM

## 2017-10-28 DIAGNOSIS — M79605 Pain in left leg: Secondary | ICD-10-CM

## 2017-10-28 DIAGNOSIS — I2510 Atherosclerotic heart disease of native coronary artery without angina pectoris: Secondary | ICD-10-CM

## 2017-10-28 DIAGNOSIS — I5042 Chronic combined systolic (congestive) and diastolic (congestive) heart failure: Secondary | ICD-10-CM

## 2017-10-28 DIAGNOSIS — N184 Chronic kidney disease, stage 4 (severe): Secondary | ICD-10-CM

## 2017-10-28 MED ORDER — WARFARIN 6 MG TABLET: tablet | 3 refills | 0 days | Status: AC

## 2017-10-28 MED ORDER — WARFARIN 6 MG TABLET
3 refills | 0.00000 days | Status: CP
Start: 2017-10-28 — End: 2017-12-04

## 2017-11-03 ENCOUNTER — Ambulatory Visit: Admit: 2017-11-03 | Discharge: 2017-11-04 | Payer: MEDICARE | Attending: Clinical | Primary: Clinical

## 2017-11-03 DIAGNOSIS — F4320 Adjustment disorder, unspecified: Principal | ICD-10-CM

## 2017-11-22 ENCOUNTER — Ambulatory Visit
Admit: 2017-11-22 | Discharge: 2017-11-25 | Disposition: A | Discharge status: Home Health | Payer: MEDICARE | Admitting: Internal Medicine

## 2017-11-22 DIAGNOSIS — M25462 Effusion, left knee: Principal | ICD-10-CM

## 2017-12-04 ENCOUNTER — Ambulatory Visit: Admit: 2017-12-04 | Discharge: 2017-12-05 | Payer: MEDICARE

## 2017-12-04 DIAGNOSIS — N184 Chronic kidney disease, stage 4 (severe): Secondary | ICD-10-CM

## 2017-12-04 DIAGNOSIS — I5042 Chronic combined systolic (congestive) and diastolic (congestive) heart failure: Secondary | ICD-10-CM

## 2017-12-04 DIAGNOSIS — E1122 Type 2 diabetes mellitus with diabetic chronic kidney disease: Principal | ICD-10-CM

## 2017-12-04 DIAGNOSIS — M25562 Pain in left knee: Secondary | ICD-10-CM

## 2017-12-04 DIAGNOSIS — I10 Essential (primary) hypertension: Secondary | ICD-10-CM

## 2017-12-04 DIAGNOSIS — Z794 Long term (current) use of insulin: Secondary | ICD-10-CM

## 2017-12-04 MED ORDER — AMLODIPINE 10 MG TABLET: 10 mg | tablet | Freq: Every day | 0 refills | 0 days | Status: AC

## 2017-12-04 MED ORDER — WARFARIN 6 MG TABLET
3 refills | 0 days | Status: CP
Start: 2017-12-04 — End: 2018-02-01
  Filled 2017-12-07: qty 200, 90d supply, fill #0

## 2017-12-04 MED ORDER — DICLOFENAC 1 % TOPICAL GEL
TOPICAL | PRN refills | 0 days
Start: 2017-12-04 — End: 2018-02-05

## 2017-12-04 MED ORDER — HUMULIN N NPH U-100 INSULIN KWIKPEN 100 UNIT/ML (3 ML) SUBCUTANEOUS
Freq: Two times a day (BID) | 5 refills | 0.00000 days
Start: 2017-12-04 — End: 2017-12-09

## 2017-12-04 MED ORDER — TRAMADOL 50 MG TABLET
ORAL_TABLET | Freq: Three times a day (TID) | ORAL | 0 refills | 0 days | Status: CP | PRN
Start: 2017-12-04 — End: 2017-12-12
  Filled 2017-12-07: qty 180, 30d supply, fill #0

## 2017-12-07 MED FILL — TRAMADOL 50 MG TABLET: 5 days supply | Qty: 15 | Fill #0 | Status: AC

## 2017-12-07 MED FILL — FLUOXETINE 20 MG CAPSULE: 90 days supply | Qty: 270 | Fill #0

## 2017-12-07 MED FILL — WARFARIN 6 MG TABLET: 90 days supply | Qty: 200 | Fill #0 | Status: AC

## 2017-12-07 MED FILL — TRAMADOL 50 MG TABLET: ORAL | 5 days supply | Qty: 15 | Fill #0

## 2017-12-07 MED FILL — UNIFINE PENTIPS 31 GAUGE X 5/16" NEEDLE: 80 days supply | Qty: 400 | Fill #0 | Status: AC

## 2017-12-07 MED FILL — FLUOXETINE 20 MG CAPSULE: 90 days supply | Qty: 270 | Fill #0 | Status: AC

## 2017-12-07 MED FILL — VOLTAREN 1 % TOPICAL GEL: 17 days supply | Qty: 100 | Fill #0 | Status: AC

## 2017-12-07 MED FILL — ACETAMINOPHEN 500 MG TABLET: 30 days supply | Qty: 180 | Fill #0 | Status: AC

## 2017-12-07 MED FILL — AMLODIPINE 10 MG TABLET: 30 days supply | Qty: 30 | Fill #0 | Status: AC

## 2017-12-09 MED ORDER — HUMULIN N NPH U-100 INSULIN KWIKPEN 100 UNIT/ML (3 ML) SUBCUTANEOUS
Freq: Two times a day (BID) | SUBCUTANEOUS | 5 refills | 0.00000 days | Status: CP
Start: 2017-12-09 — End: 2018-01-14
  Filled 2017-12-21: qty 15, 50d supply, fill #0

## 2017-12-14 DIAGNOSIS — Z5321 Procedure and treatment not carried out due to patient leaving prior to being seen by health care provider: Principal | ICD-10-CM

## 2017-12-15 ENCOUNTER — Emergency Department: Admit: 2017-12-15 | Discharge: 2017-12-15 | Discharge status: Against Medical Advice | Payer: MEDICARE

## 2017-12-15 ENCOUNTER — Ambulatory Visit: Admit: 2017-12-15 | Discharge: 2017-12-15 | Discharge status: Against Medical Advice | Payer: MEDICARE

## 2017-12-16 ENCOUNTER — Inpatient Hospital Stay
Admission: EM | Admit: 2017-12-16 | Discharge: 2017-12-20 | DRG: 488 | Disposition: A | Discharge status: Home Health | Payer: Medicare Other | Attending: Specialist | Admitting: Specialist

## 2017-12-16 ENCOUNTER — Emergency Department: Payer: Medicare Other

## 2017-12-16 ENCOUNTER — Inpatient Hospital Stay: Payer: Medicare Other

## 2017-12-16 ENCOUNTER — Other Ambulatory Visit: Payer: Self-pay

## 2017-12-16 ENCOUNTER — Encounter: Payer: Self-pay | Admitting: Emergency Medicine

## 2017-12-16 DIAGNOSIS — E871 Hypo-osmolality and hyponatremia: Secondary | ICD-10-CM | POA: Diagnosis present

## 2017-12-16 DIAGNOSIS — E1122 Type 2 diabetes mellitus with diabetic chronic kidney disease: Secondary | ICD-10-CM | POA: Diagnosis present

## 2017-12-16 DIAGNOSIS — I480 Paroxysmal atrial fibrillation: Secondary | ICD-10-CM | POA: Diagnosis present

## 2017-12-16 DIAGNOSIS — G4733 Obstructive sleep apnea (adult) (pediatric): Secondary | ICD-10-CM | POA: Diagnosis present

## 2017-12-16 DIAGNOSIS — I252 Old myocardial infarction: Secondary | ICD-10-CM | POA: Diagnosis not present

## 2017-12-16 DIAGNOSIS — Z7902 Long term (current) use of antithrombotics/antiplatelets: Secondary | ICD-10-CM

## 2017-12-16 DIAGNOSIS — I255 Ischemic cardiomyopathy: Secondary | ICD-10-CM | POA: Diagnosis present

## 2017-12-16 DIAGNOSIS — T368X5A Adverse effect of other systemic antibiotics, initial encounter: Secondary | ICD-10-CM | POA: Diagnosis present

## 2017-12-16 DIAGNOSIS — I2510 Atherosclerotic heart disease of native coronary artery without angina pectoris: Secondary | ICD-10-CM | POA: Diagnosis present

## 2017-12-16 DIAGNOSIS — M0080 Arthritis due to other bacteria, unspecified joint: Secondary | ICD-10-CM

## 2017-12-16 DIAGNOSIS — Z8674 Personal history of sudden cardiac arrest: Secondary | ICD-10-CM

## 2017-12-16 DIAGNOSIS — I130 Hypertensive heart and chronic kidney disease with heart failure and stage 1 through stage 4 chronic kidney disease, or unspecified chronic kidney disease: Secondary | ICD-10-CM | POA: Diagnosis present

## 2017-12-16 DIAGNOSIS — E1142 Type 2 diabetes mellitus with diabetic polyneuropathy: Secondary | ICD-10-CM | POA: Diagnosis present

## 2017-12-16 DIAGNOSIS — M1710 Unilateral primary osteoarthritis, unspecified knee: Secondary | ICD-10-CM | POA: Diagnosis present

## 2017-12-16 DIAGNOSIS — N179 Acute kidney failure, unspecified: Secondary | ICD-10-CM | POA: Diagnosis present

## 2017-12-16 DIAGNOSIS — Z885 Allergy status to narcotic agent status: Secondary | ICD-10-CM | POA: Diagnosis not present

## 2017-12-16 DIAGNOSIS — E875 Hyperkalemia: Secondary | ICD-10-CM | POA: Diagnosis present

## 2017-12-16 DIAGNOSIS — N2581 Secondary hyperparathyroidism of renal origin: Secondary | ICD-10-CM | POA: Diagnosis present

## 2017-12-16 DIAGNOSIS — G894 Chronic pain syndrome: Secondary | ICD-10-CM | POA: Diagnosis present

## 2017-12-16 DIAGNOSIS — I959 Hypotension, unspecified: Secondary | ICD-10-CM | POA: Diagnosis present

## 2017-12-16 DIAGNOSIS — N184 Chronic kidney disease, stage 4 (severe): Secondary | ICD-10-CM | POA: Diagnosis present

## 2017-12-16 DIAGNOSIS — Z794 Long term (current) use of insulin: Secondary | ICD-10-CM | POA: Diagnosis not present

## 2017-12-16 DIAGNOSIS — Z6841 Body Mass Index (BMI) 40.0 and over, adult: Secondary | ICD-10-CM | POA: Diagnosis not present

## 2017-12-16 DIAGNOSIS — M10062 Idiopathic gout, left knee: Principal | ICD-10-CM | POA: Diagnosis present

## 2017-12-16 DIAGNOSIS — I5042 Chronic combined systolic (congestive) and diastolic (congestive) heart failure: Secondary | ICD-10-CM | POA: Diagnosis present

## 2017-12-16 DIAGNOSIS — Z86718 Personal history of other venous thrombosis and embolism: Secondary | ICD-10-CM

## 2017-12-16 DIAGNOSIS — Z7901 Long term (current) use of anticoagulants: Secondary | ICD-10-CM

## 2017-12-16 DIAGNOSIS — Z87891 Personal history of nicotine dependence: Secondary | ICD-10-CM

## 2017-12-16 DIAGNOSIS — F329 Major depressive disorder, single episode, unspecified: Secondary | ICD-10-CM | POA: Diagnosis present

## 2017-12-16 DIAGNOSIS — Z01818 Encounter for other preprocedural examination: Secondary | ICD-10-CM

## 2017-12-16 DIAGNOSIS — J449 Chronic obstructive pulmonary disease, unspecified: Secondary | ICD-10-CM | POA: Diagnosis present

## 2017-12-16 DIAGNOSIS — E1165 Type 2 diabetes mellitus with hyperglycemia: Secondary | ICD-10-CM | POA: Diagnosis present

## 2017-12-16 DIAGNOSIS — A498 Other bacterial infections of unspecified site: Secondary | ICD-10-CM

## 2017-12-16 DIAGNOSIS — D631 Anemia in chronic kidney disease: Secondary | ICD-10-CM | POA: Diagnosis present

## 2017-12-16 DIAGNOSIS — Z95810 Presence of automatic (implantable) cardiac defibrillator: Secondary | ICD-10-CM | POA: Diagnosis not present

## 2017-12-16 DIAGNOSIS — Z79899 Other long term (current) drug therapy: Secondary | ICD-10-CM | POA: Diagnosis not present

## 2017-12-16 DIAGNOSIS — E7800 Pure hypercholesterolemia, unspecified: Secondary | ICD-10-CM | POA: Diagnosis present

## 2017-12-16 DIAGNOSIS — M01X0 Direct infection of unspecified joint in infectious and parasitic diseases classified elsewhere: Secondary | ICD-10-CM

## 2017-12-16 DIAGNOSIS — Z955 Presence of coronary angioplasty implant and graft: Secondary | ICD-10-CM

## 2017-12-16 DIAGNOSIS — K219 Gastro-esophageal reflux disease without esophagitis: Secondary | ICD-10-CM | POA: Diagnosis present

## 2017-12-16 DIAGNOSIS — Z8673 Personal history of transient ischemic attack (TIA), and cerebral infarction without residual deficits: Secondary | ICD-10-CM

## 2017-12-16 DIAGNOSIS — N189 Chronic kidney disease, unspecified: Secondary | ICD-10-CM

## 2017-12-16 DIAGNOSIS — M009 Pyogenic arthritis, unspecified: Secondary | ICD-10-CM | POA: Diagnosis present

## 2017-12-16 HISTORY — DX: Type 2 diabetes mellitus without complications: E11.9

## 2017-12-16 HISTORY — DX: Essential (primary) hypertension: I10

## 2017-12-16 HISTORY — DX: Hyperlipidemia, unspecified: E78.5

## 2017-12-16 HISTORY — DX: Paroxysmal atrial fibrillation: I48.0

## 2017-12-16 HISTORY — DX: Atherosclerotic heart disease of native coronary artery without angina pectoris: I25.10

## 2017-12-16 HISTORY — DX: Acute myocardial infarction, unspecified: I21.9

## 2017-12-16 LAB — COMPREHENSIVE METABOLIC PANEL
ALBUMIN: 2.9 g/dL — AB (ref 3.5–5.0)
ALT: 9 U/L (ref 0–44)
ANION GAP: 10 (ref 5–15)
AST: 9 U/L — ABNORMAL LOW (ref 15–41)
Alkaline Phosphatase: 93 U/L (ref 38–126)
BILIRUBIN TOTAL: 0.3 mg/dL (ref 0.3–1.2)
BUN: 44 mg/dL — ABNORMAL HIGH (ref 6–20)
CO2: 21 mmol/L — ABNORMAL LOW (ref 22–32)
Calcium: 9.1 mg/dL (ref 8.9–10.3)
Chloride: 101 mmol/L (ref 98–111)
Creatinine, Ser: 3.14 mg/dL — ABNORMAL HIGH (ref 0.61–1.24)
GFR calc non Af Amer: 22 mL/min — ABNORMAL LOW (ref >60)
GFR, EST AFRICAN AMERICAN: 25 mL/min — AB (ref >60)
GLUCOSE: 325 mg/dL — AB (ref 70–99)
POTASSIUM: 5.2 mmol/L — AB (ref 3.5–5.1)
Sodium: 132 mmol/L — ABNORMAL LOW (ref 135–145)
TOTAL PROTEIN: 8.4 g/dL — AB (ref 6.5–8.1)

## 2017-12-16 LAB — SYNOVIAL CELL COUNT + DIFF, W/ CRYSTALS
Eosinophils-Synovial: 0 %
LYMPHOCYTES-SYNOVIAL FLD: 2 %
MONOCYTE-MACROPHAGE-SYNOVIAL FLUID: 4 %
Neutrophil, Synovial: 94 %
Other Cells-SYN: 0
WBC, Synovial: 90772 /mm3 — ABNORMAL HIGH (ref 0–200)

## 2017-12-16 LAB — SALICYLATE LEVEL

## 2017-12-16 LAB — CBC WITH DIFFERENTIAL/PLATELET
BASOS PCT: 1 %
Basophils Absolute: 0.1 10*3/uL (ref 0–0.1)
Eosinophils Absolute: 0.1 10*3/uL (ref 0–0.7)
Eosinophils Relative: 1 %
HEMATOCRIT: 30.4 % — AB (ref 40.0–52.0)
Hemoglobin: 10.1 g/dL — ABNORMAL LOW (ref 13.0–18.0)
Lymphocytes Relative: 15 %
Lymphs Abs: 1.6 10*3/uL (ref 1.0–3.6)
MCH: 26.8 pg (ref 26.0–34.0)
MCHC: 33.3 g/dL (ref 32.0–36.0)
MCV: 80.6 fL (ref 80.0–100.0)
MONOS PCT: 7 %
Monocytes Absolute: 0.7 10*3/uL (ref 0.2–1.0)
NEUTROS ABS: 8.1 10*3/uL — AB (ref 1.4–6.5)
Neutrophils Relative %: 76 %
Platelets: 421 10*3/uL (ref 150–440)
RBC: 3.77 MIL/uL — ABNORMAL LOW (ref 4.40–5.90)
RDW: 16.1 % — ABNORMAL HIGH (ref 11.5–14.5)
WBC: 10.6 10*3/uL (ref 3.8–10.6)

## 2017-12-16 LAB — GLUCOSE, CAPILLARY: GLUCOSE-CAPILLARY: 322 mg/dL — AB (ref 70–99)

## 2017-12-16 LAB — SEDIMENTATION RATE: Sed Rate: 115 mm/hr — ABNORMAL HIGH (ref 0–15)

## 2017-12-16 LAB — PROTIME-INR
INR: 2.1
PROTHROMBIN TIME: 23.4 s — AB (ref 11.4–15.2)

## 2017-12-16 LAB — ACETAMINOPHEN LEVEL: Acetaminophen (Tylenol), Serum: <10 ug/mL — ABNORMAL LOW (ref 10–30)

## 2017-12-16 MED ORDER — EZETIMIBE 10 MG PO TABS
10.0000 mg | ORAL_TABLET | Freq: Every day | ORAL | Status: DC
  Administered 2017-12-18 – 2017-12-20 (×3): 10 mg via ORAL
  Filled 2017-12-16 (×4): qty 1

## 2017-12-16 MED ORDER — CARVEDILOL 25 MG PO TABS
25.0000 mg | ORAL_TABLET | Freq: Every day | ORAL | Status: DC
  Administered 2017-12-17 – 2017-12-20 (×4): 25 mg via ORAL
  Filled 2017-12-16 (×5): qty 1

## 2017-12-16 MED ORDER — SEVELAMER CARBONATE 800 MG PO TABS
1600.0000 mg | ORAL_TABLET | Freq: Three times a day (TID) | ORAL | Status: DC
  Administered 2017-12-17 – 2017-12-20 (×10): 1600 mg via ORAL
  Filled 2017-12-16 (×12): qty 2

## 2017-12-16 MED ORDER — VITAMIN K1 10 MG/ML IJ SOLN
10.0000 mg | Freq: Once | INTRAVENOUS | Status: DC
  Filled 2017-12-16: qty 1

## 2017-12-16 MED ORDER — VITAMIN D 1000 UNITS PO TABS
5000.0000 [IU] | ORAL_TABLET | Freq: Every day | ORAL | Status: DC
  Administered 2017-12-18 – 2017-12-20 (×3): 5000 [IU] via ORAL
  Filled 2017-12-16 (×4): qty 5

## 2017-12-16 MED ORDER — VANCOMYCIN VARIABLE DOSE PER UNSTABLE RENAL FUNCTION (PHARMACIST DOSING): Status: DC

## 2017-12-16 MED ORDER — HYDROMORPHONE HCL 1 MG/ML IJ SOLN
INTRAMUSCULAR | Status: AC
  Filled 2017-12-16: qty 1

## 2017-12-16 MED ORDER — VANCOMYCIN HCL IN DEXTROSE 1-5 GM/200ML-% IV SOLN
1000.0000 mg | Freq: Once | INTRAVENOUS | Status: AC
  Administered 2017-12-16: 1000 mg via INTRAVENOUS
  Filled 2017-12-16: qty 200

## 2017-12-16 MED ORDER — VANCOMYCIN HCL IN DEXTROSE 1-5 GM/200ML-% IV SOLN
1000.0000 mg | Freq: Once | INTRAVENOUS | Status: DC
  Filled 2017-12-16: qty 200

## 2017-12-16 MED ORDER — INSULIN ASPART 100 UNIT/ML ~~LOC~~ SOLN
0.0000 [IU] | Freq: Every day | SUBCUTANEOUS | Status: DC
  Administered 2017-12-17: 4 [IU] via SUBCUTANEOUS
  Filled 2017-12-16: qty 1

## 2017-12-16 MED ORDER — MORPHINE SULFATE (PF) 4 MG/ML IV SOLN
4.0000 mg | Freq: Once | INTRAVENOUS | Status: AC
  Administered 2017-12-16: 4 mg via INTRAVENOUS
  Filled 2017-12-16: qty 1

## 2017-12-16 MED ORDER — TICAGRELOR 90 MG PO TABS
90.0000 mg | ORAL_TABLET | Freq: Two times a day (BID) | ORAL | Status: DC
  Administered 2017-12-17 – 2017-12-20 (×8): 90 mg via ORAL
  Filled 2017-12-16 (×9): qty 1

## 2017-12-16 MED ORDER — VANCOMYCIN HCL 10 G IV SOLR
2000.0000 mg | INTRAVENOUS | Status: DC
  Filled 2017-12-16: qty 2000

## 2017-12-16 MED ORDER — CARVEDILOL 25 MG PO TABS
50.0000 mg | ORAL_TABLET | Freq: Every day | ORAL | Status: DC
  Administered 2017-12-17 – 2017-12-19 (×4): 50 mg via ORAL
  Filled 2017-12-16 (×3): qty 2

## 2017-12-16 MED ORDER — SODIUM CHLORIDE 0.9 % IV SOLN
1.0000 g | Freq: Once | INTRAVENOUS | Status: DC
  Administered 2017-12-16: 1 g via INTRAVENOUS
  Filled 2017-12-16: qty 1

## 2017-12-16 MED ORDER — SODIUM CHLORIDE 0.9 % IV SOLN
INTRAVENOUS | Status: DC
  Administered 2017-12-16: 23:00:00 via INTRAVENOUS

## 2017-12-16 MED ORDER — FLUOXETINE HCL 20 MG PO CAPS
60.0000 mg | ORAL_CAPSULE | Freq: Every day | ORAL | Status: DC
  Administered 2017-12-18 – 2017-12-20 (×3): 60 mg via ORAL
  Filled 2017-12-16 (×4): qty 3

## 2017-12-16 MED ORDER — LIDOCAINE-EPINEPHRINE 2 %-1:100000 IJ SOLN
20.0000 mL | Freq: Once | INTRAMUSCULAR | Status: AC
  Administered 2017-12-16: 18:00:00 via INTRADERMAL

## 2017-12-16 MED ORDER — VANCOMYCIN HCL IN DEXTROSE 1-5 GM/200ML-% IV SOLN: 1000.0000 mg | Freq: Two times a day (BID) | INTRAVENOUS | Status: DC

## 2017-12-16 MED ORDER — AMLODIPINE BESYLATE 10 MG PO TABS
10.0000 mg | ORAL_TABLET | Freq: Every day | ORAL | Status: DC
  Administered 2017-12-18 – 2017-12-20 (×3): 10 mg via ORAL
  Filled 2017-12-16 (×3): qty 1

## 2017-12-16 MED ORDER — CLINDAMYCIN PHOSPHATE 600 MG/50ML IV SOLN
600.0000 mg | Freq: Once | INTRAVENOUS | Status: AC
  Administered 2017-12-16: 600 mg via INTRAVENOUS
  Filled 2017-12-16: qty 50

## 2017-12-16 MED ORDER — LIDOCAINE HCL (PF) 1 % IJ SOLN
INTRAMUSCULAR | Status: AC
  Filled 2017-12-16: qty 5

## 2017-12-16 MED ORDER — ATORVASTATIN CALCIUM 20 MG PO TABS
80.0000 mg | ORAL_TABLET | Freq: Every evening | ORAL | Status: DC
  Administered 2017-12-17 – 2017-12-19 (×4): 80 mg via ORAL
  Filled 2017-12-16 (×4): qty 4

## 2017-12-16 MED ORDER — BUMETANIDE 1 MG PO TABS
2.0000 mg | ORAL_TABLET | Freq: Every day | ORAL | Status: DC
  Administered 2017-12-17 – 2017-12-19 (×3): 2 mg via ORAL
  Filled 2017-12-16 (×3): qty 2

## 2017-12-16 MED ORDER — HYDROCODONE-ACETAMINOPHEN 5-325 MG PO TABS
1.0000 | ORAL_TABLET | Freq: Four times a day (QID) | ORAL | Status: DC | PRN
  Administered 2017-12-17: 2 via ORAL
  Filled 2017-12-16: qty 2

## 2017-12-16 MED ORDER — INSULIN ASPART 100 UNIT/ML ~~LOC~~ SOLN
0.0000 [IU] | Freq: Three times a day (TID) | SUBCUTANEOUS | Status: DC
  Administered 2017-12-17: 3 [IU] via SUBCUTANEOUS
  Filled 2017-12-16: qty 1

## 2017-12-16 MED ORDER — CLINDAMYCIN PHOSPHATE 600 MG/50ML IV SOLN: 600.0000 mg | Freq: Once | INTRAVENOUS | Status: DC

## 2017-12-16 MED ORDER — ONDANSETRON HCL 4 MG/2ML IJ SOLN
4.0000 mg | Freq: Once | INTRAMUSCULAR | Status: AC
  Administered 2017-12-16: 4 mg via INTRAVENOUS
  Filled 2017-12-16: qty 2

## 2017-12-16 MED ORDER — INSULIN DETEMIR 100 UNIT/ML ~~LOC~~ SOLN
10.0000 [IU] | Freq: Every day | SUBCUTANEOUS | Status: DC
  Administered 2017-12-17: 10 [IU] via SUBCUTANEOUS
  Filled 2017-12-16 (×2): qty 0.1

## 2017-12-16 MED ORDER — SODIUM CHLORIDE 0.9 % IV SOLN: 1.0000 g | Freq: Once | INTRAVENOUS | Status: DC

## 2017-12-16 MED ORDER — LIDOCAINE-EPINEPHRINE 2 %-1:100000 IJ SOLN
INTRAMUSCULAR | Status: AC
  Filled 2017-12-16: qty 1

## 2017-12-16 MED ORDER — FAMOTIDINE 20 MG PO TABS
20.0000 mg | ORAL_TABLET | Freq: Every day | ORAL | Status: DC
  Administered 2017-12-18 – 2017-12-20 (×3): 20 mg via ORAL
  Filled 2017-12-16 (×3): qty 1

## 2017-12-16 MED ORDER — PIPERACILLIN-TAZOBACTAM 3.375 G IVPB
3.3750 g | Freq: Three times a day (TID) | INTRAVENOUS | Status: DC
  Administered 2017-12-17: 3.375 g via INTRAVENOUS
  Filled 2017-12-16: qty 50

## 2017-12-16 MED ORDER — AMIODARONE HCL 200 MG PO TABS
200.0000 mg | ORAL_TABLET | Freq: Every day | ORAL | Status: DC
  Administered 2017-12-17 – 2017-12-20 (×4): 200 mg via ORAL
  Filled 2017-12-16 (×4): qty 1

## 2017-12-16 MED ORDER — PATIROMER SORBITEX CALCIUM 8.4 G PO PACK
16.8000 g | PACK | Freq: Every day | ORAL | Status: DC
  Administered 2017-12-19: 16.8 g via ORAL
  Filled 2017-12-16 (×5): qty 2

## 2017-12-16 MED ORDER — HYDROMORPHONE HCL 1 MG/ML IJ SOLN
0.5000 mg | Freq: Once | INTRAMUSCULAR | Status: AC
  Administered 2017-12-16: 0.5 mg via INTRAVENOUS

## 2017-12-16 NOTE — ED Notes (Signed)
Called Poison Control and spoke to [employer] (nurse) and was told to collect acetaminophen level and PT INR. Repeat liver enzymes in 6 hrs. MD Realitos notified.

## 2017-12-16 NOTE — Anesthesia Preprocedure Evaluation (Addendum)
Anesthesia Evaluation  Patient identified by MRN, date of birth, ID band Patient awake    Reviewed: Allergy & Precautions, H&P , NPO status , Patient's Chart, lab work & pertinent test results, reviewed documented beta blocker date and time   History of Anesthesia Complications Negative for: history of anesthetic complications  Airway Mallampati: III  TM Distance: >3 FB Neck ROM: full    Dental  (+) Dental Advidsory Given, Poor Dentition   Pulmonary neg shortness of breath, sleep apnea , neg COPD, neg recent URI, former smoker,           Cardiovascular Exercise Tolerance: Good hypertension, (-) angina+ CAD, + Past MI and + Cardiac Stents  (-) CABG + dysrhythmias Atrial Fibrillation + pacemaker + Cardiac Defibrillator (-) Valvular Problems/Murmurs     Neuro/Psych negative neurological ROS  negative psych ROS   GI/Hepatic negative GI ROS, Neg liver ROS,   Endo/Other  diabetesMorbid obesity  Renal/GU CRFRenal disease  negative genitourinary   Musculoskeletal   Abdominal   Peds  Hematology negative hematology ROS (+)   Anesthesia Other Findings Past Medical History: No date: Diabetes mellitus without complication ([name]) No date: Heart attack ([place]) No date: Hyperlipidemia No date: Hypertension   Reproductive/Obstetrics negative OB ROS                            Anesthesia Physical Anesthesia Plan  ASA: III  Anesthesia Plan: General   Post-op Pain Management:    Induction: Intravenous  PONV Risk Score and Plan: 2 and Ondansetron and Dexamethasone  Airway Management Planned: Oral ETT and LMA  Additional Equipment:   Intra-op Plan:   Post-operative Plan: Extubation in OR  Informed Consent: I have reviewed the patients History and Physical, chart, labs and discussed the procedure including the risks, benefits and alternatives for the proposed anesthesia with the patient or  authorized representative who has indicated his/her understanding and acceptance.   Dental Advisory Given  Plan Discussed with: Anesthesiologist, CRNA and Surgeon  Anesthesia Plan Comments: (Patient should have medical clearance for this procedure )       Anesthesia Quick Evaluation

## 2017-12-16 NOTE — Progress Notes (Signed)
Pharmacy Antibiotic Note  Edwin [name] is a 47 y.o. male admitted on 12/16/2017 with osteomyelitis.  Pharmacy has been consulted for vancomycin dosing.  Plan: Vancomycin 1 gm IV x 1 in ED, will give another 1 gm IV x 1 to make a total of 2 gm load then start vancomycin 2 gm IV Q24H (although dosing will likely need to be empirically adjusted due to AKI [baseline SCr seems to be around 2.5 mg/dL but measuring greater than 3 mg/dL today]). Pharmacy will continue to follow and adjust as needed to maintain trough 15 to 20 mcg/ml.   Vd 75.8 L, Ke 0.041 hr-1, T1/2 17 hr  Height: 6\' 2"  (188 cm) Weight: (!) 325 lb (147.4 kg) IBW/kg (Calculated) : 82.2  Temp (24hrs), Avg:98.2 F (36.8 C), Min:98.1 F (36.7 C), Max:98.3 F (36.8 C)  Recent Labs  Lab 12/16/17 1339  WBC 10.6  CREATININE 3.14*    Estimated Creatinine Clearance: 43.6 mL/min (A) (by C-G formula based on SCr of 3.14 mg/dL (H)).    Allergies  Allergen Reactions  . Morphine And Related     Antimicrobials this admission:   Dose adjustments this admission:   Microbiology results:  BCx:   UCx:    Sputum:    MRSA PCR:   Thank you for allowing pharmacy to be a part of this patient's care.  Laural Benes, Pharm.D., BCPS Clinical Pharmacist 12/16/2017 7:29 PM

## 2017-12-16 NOTE — ED Triage Notes (Signed)
Pt arrived via [facility] from home with complaints of left knee pain. Pt states pain started 1 month ago after mechanical fall. Pt has has a checkup on knee but "it's still there." pt denies chest pain or shortness of breath  Per ems report pt has been taking 2g of tylenol every 4 hours for the last 2 weeks.

## 2017-12-16 NOTE — ED Provider Notes (Signed)
[facility] Emergency Department Provider Note   ____________________________________________   First MD Initiated Contact with Patient 12/16/17 1413     (approximate)  I have reviewed the triage vital signs and the nursing notes.   HISTORY  Chief Complaint Knee Pain    HPI Reg Bircher Heydt is a 47 y.o. male who has multiple problems with his legs including very numb leg on the left after several injuries and nerve damage possibly from a cardiac cath.  He was doing much better reports he was going upstairs a dog when prior knocked him down he fell hit his knee.  And having knee pain for over a month now it has been worse.  He [facility] to [facility] about 3 weeks ago first pain and swelling diagnosed him with a small hemarthrosis and sent him home.  He reports the pain is been getting worse and swelling is been getting worse he came into the ER today.  Here in the ER he has some diffuse swelling in the anterior part of the knee.  It is diffusely painful.  Past Medical History:  Diagnosis Date  . Diabetes mellitus without complication ([place])   . Heart attack ([place])   . Hyperlipidemia   . Hypertension     There are no active problems to display for this patient.   Past Surgical History:  Procedure Laterality Date  . CARDIAC SURGERY    . JOINT REPLACEMENT      Prior to Admission medications   Medication Sig Start Date End Date Taking? Authorizing Provider  amiodarone (PACERONE) 200 MG tablet Take 200 mg by mouth daily. 09/14/17  Yes [provider]  amLODipine (NORVASC) 10 MG tablet Take 10 mg by mouth daily. 10/16/17  Yes [provider]  atorvastatin (LIPITOR) 80 MG tablet Take 80 mg by mouth every evening.  09/14/17  Yes [provider]  BRILINTA 90 MG TABS tablet Take 90 mg by mouth 2 (two) times daily. 09/14/17  Yes [provider]  bumetanide (BUMEX) 2 MG tablet Take 2 mg by mouth daily. 09/14/17  Yes [provider]    carvedilol (COREG) 25 MG tablet See admin instructions. Take 1 tablet (25MG ) by mouth every morning and 2 tablets (50MG ) by mouth every night 10/16/17  Yes [provider]  cholecalciferol (VITAMIN D) 1000 units tablet Take 5,000 Units by mouth daily.   Yes [provider]  ezetimibe (ZETIA) 10 MG tablet Take 10 mg by mouth daily. 10/14/17  Yes [provider]  famotidine (PEPCID) 20 MG tablet Take 20 mg by mouth daily. 09/14/17  Yes [provider]  FLUoxetine (PROZAC) 20 MG capsule Take 60 mg by mouth daily. 12/04/17  Yes [provider]  HUMULIN N KWIKPEN 100 UNIT/ML Kiwkpen Inject 15 Units into the skin 2 (two) times daily. 12/10/17  Yes [provider]  RENVELA 800 MG tablet Take 1,600 mg by mouth 3 (three) times daily with meals. 10/14/17  Yes [provider]  VOLTAREN 1 % GEL Apply 2 g topically 3 (three) times daily. 12/04/17  Yes [provider]  warfarin (COUMADIN) 6 MG tablet See admin instructions. Take 2 tablets (15MG ) by mouth daily on Monday, Wednesday, Friday and Saturday and take 2 tablets (12MG ) by mouth daily on Tuesday, Thursday and Sunday 10/16/17  Yes [provider]    Allergies Morphine and related  No family history on file.  Social History Social History   Tobacco Use  . Smoking status:  Former Smoker  . Smokeless tobacco: Current User  Substance Use Topics  . Alcohol use: Never    Frequency: Never  . Drug use: Not on file    Review of Systems  Constitutional: No fever/chills Eyes: No visual changes. ENT: No sore throat. Cardiovascular: Denies chest pain. Respiratory: Denies shortness of breath. Gastrointestinal: No abdominal pain.  No nausea, no vomiting.  No diarrhea.  No constipation. Genitourinary: Negative for dysuria. Musculoskeletal: Negative for back pain. Skin: Negative for rash. Neurological: Negative for headaches, new focal weakness   ____________________________________________   PHYSICAL EXAM:  VITAL SIGNS: ED Triage Vitals [12/16/17 1333]  Enc Vitals Group     BP (!) 151/100     Pulse Rate 100     Resp 18     Temp 98.3 F (36.8 C)     Temp Source Oral     SpO2 98 %     Weight (!) 325 lb (147.4 kg)     Height 6\' 2"  (1.88 m)     Head Circumference      Peak Flow      Pain Score 8     Pain Loc      Pain Edu?      Excl. in [place]?     Constitutional: Alert and oriented.  Looks uncomfortable Eyes: Conjunctivae are normal. PERRL. EOMI. Head: Atraumatic. Nose: No congestion/rhinnorhea. Mouth/Throat: Mucous membranes are moist.  Oropharynx non-erythematous. Neck: No stridor.  Cardiovascular: Normal rate, regular rhythm. Grossly normal heart sounds.  Good peripheral circulation. Respiratory: Normal respiratory effort.  No retractions. Lungs CTAB. Gastrointestinal: Soft and nontender. No distention. No abdominal bruits. No CVA tenderness. Musculoskeletal: Knee as described in HPI.  There is no effusion in the knee. Neurologic:  Normal speech and language. No gross focal neurologic deficits are appreciated.  Skin:  Skin is warm, dry and intact. No rash noted. Psychiatric: Mood and affect are normal. Speech and behavior are normal.  ____________________________________________   LABS (all labs ordered are listed, but only abnormal results are displayed)  Labs Reviewed  CBC WITH DIFFERENTIAL/PLATELET - Abnormal; Notable for the following components:      Result Value   RBC 3.77 (*)    Hemoglobin 10.1 (*)    HCT 30.4 (*)    RDW 16.1 (*)    Neutro Abs 8.1 (*)    All other components within normal limits  COMPREHENSIVE METABOLIC PANEL - Abnormal; Notable for the following components:   Sodium 132 (*)    Potassium 5.2 (*)    CO2 21 (*)    Glucose, Bld 325 (*)    BUN 44 (*)    Creatinine, Ser 3.14 (*)    Total Protein 8.4 (*)    Albumin 2.9 (*)    AST 9 (*)    GFR calc non Af Amer 22 (*)    GFR calc  Af Amer 25 (*)    All other components within normal limits  ACETAMINOPHEN LEVEL - Abnormal; Notable for the following components:   Acetaminophen (Tylenol), Serum <10 (*)    All other components within normal limits  PROTIME-INR - Abnormal; Notable for the following components:   Prothrombin Time 23.4 (*)    All other components within normal limits  SEDIMENTATION RATE - Abnormal; Notable for the following components:   Sed Rate 115 (*)    All other components within normal limits  BODY FLUID CULTURE  SALICYLATE LEVEL  SYNOVIAL CELL COUNT + DIFF, W/ CRYSTALS  SURGICAL PATHOLOGY   ____________________________________________  EKG   ____________________________________________  RADIOLOGY  ED MD interpretation: X-ray and then CT shows gas in the knee.  There is a joint effusion.  Official radiology report(s): Ct Knee Left Wo Contrast  Result Date: 12/16/2017 CLINICAL DATA:  Knee pain and swelling. Joint effusion with gas in the joint. Joint aspiration last week. EXAM: CT OF THE LEFT KNEE WITHOUT CONTRAST TECHNIQUE: Multidetector CT imaging of the left knee was performed according to the standard protocol. Multiplanar CT image reconstructions were also generated. COMPARISON:  Radiographs dated 12/16/2017 FINDINGS: Bones/Joint/Cartilage There are patchy areas of osteopenia in the distal femur and in the patella without discrete cortical destruction. There is a large joint effusion with gas in the joint. 6 cm Baker's cyst. Ligaments Suboptimally assessed by CT. Intact cruciate and collateral ligaments. Dystrophic calcification in the lateral patellofemoral ligament, probably due to prior remote injury. Muscles and Tendons Negative. Soft tissues Slight nonspecific subcutaneous edema and soft tissue calcifications anterior to the patella and patellar tendon. IMPRESSION: 1. Large joint effusion containing gas, worrisome for infection. 2. Patchy osteopenia of the distal femur and patella which  could be due to disuse but the possibility of early osteomyelitis should be considered. Electronically Signed   By: Lorriane Shire M.D.   On: 12/16/2017 15:38   Dg Knee Complete 4 Views Left  Result Date: 12/16/2017 CLINICAL DATA:  LEFT knee pain for a month after fall. Sharp stabbing patella pain. EXAM: LEFT KNEE - COMPLETE 4+ VIEW COMPARISON:  None. FINDINGS: No evidence of fracture, dislocation, or joint effusion. Mild patella spurring. No evidence of arthropathy or other focal bone abnormality. Suprapatellar soft tissue swelling with large effusion and subcutaneous gas. No radiopaque foreign bodies. IMPRESSION: 1. Soft tissue swelling and large suprapatellar joint effusion with subcutaneous gas, gas-forming organism/septic arthropathy not excluded. Recommend correlation with procedural history. 2. No acute osseous process or advanced degenerative change. 3. Acute findings discussed with and reconfirmed by Dr.Braedon Sjogren on 12/16/2017 at 2:39 pm. Electronically Signed   By: Elon Alas M.D.   On: 12/16/2017 14:40    ____________________________________________   PROCEDURES  Procedure(s) performed: Gust patient with Dr. Lisette Grinder who is on-call for orthopedics.  Initially I thought it was more anterior soft tissue swelling and not as much of an effusion but the effusion was apparent on x-ray.  Since patient had takes Coumadin.  He has a pacemaker.  Could not do an MRI.  Oral consent obtained patient prepped and draped for knee aspiration usual sterile fashion using Betadine.  1% lidocaine with epi used to anesthetize the skin.  I inserted 18-gauge needle and aspirated 20 cc of watery whitish-greenish looking fluid.  Sent off for labs.  Dr. Mack Guise then comes in to see the patient. He feels we should admit the patient I agree. Procedures  Critical Care performed: Critical care time 45 minutes including prepping the patient getting the oral consent the written consent and then doing the  procedure  ____________________________________________   INITIAL IMPRESSION / ASSESSMENT AND PLAN / ED COURSE  Give the patient vancomycin and clindamycin as well.      ____________________________________________   FINAL CLINICAL IMPRESSION(S) / ED DIAGNOSES  Final diagnoses:  Arthritis due to other bacteria, septic arthritis of unspecified location [facility])     ED Discharge Orders    None       Note:  This document was prepared using Dragon voice recognition software and [place] include unintentional dictation errors.    Nena Polio, MD 12/16/17 [id number]

## 2017-12-16 NOTE — Progress Notes (Signed)
Spoke with Dr. Jannifer Franklin to inform him of the request from poison control to have AST and ALT repeated. Dr. Jannifer Franklin to place orders.

## 2017-12-16 NOTE — Consult Note (Signed)
[name] [place] NAME: Edwin [name]    MR#:  [id number]  DATE OF BIRTH:  May 31, 1970  DATE OF CONSULT:  12/16/2017  PRIMARY CARE PHYSICIAN: Patient, No Pcp Per   REQUESTING/REFERRING PHYSICIAN: Dr. Thornton Park  CHIEF COMPLAINT:   Chief Complaint  Patient presents with  . Knee Pain    HISTORY OF PRESENT ILLNESS:  Edwin [name]  is a 47 y.o. male with a known history of coronary artery disease status post stent placement, diabetes, chronic kidney disease stage IV, previous history of MI, hypertension, hyperlipidemia, paroxysmal atrial fibrillation who presents to the hospital due to left knee pain.  Patient had a fall about a month ago and since then has been having some left knee pain.  His left knee has been significantly swollen and painful, he was seen at ER at [facility] about 3 weeks back and had a arthrocentesis done which was negative for infectious pathology.  He continues to have significant left knee pain and swelling and had some fever and went back to [facility] and they did a arthrocentesis which was slightly bloody but not infectious but patient was not happy with his care there and therefore left.  He now comes back today after being seen by his physical therapist at home and he was not able to bear any weight in that left knee and having significant pain.  He underwent a CT scan of his left knee which was worrisome for infection with underlying gas noted on the CT.  Patient is being admitted by the orthopedic service but hospitalist services were contacted for medical management of his chronic medical issues.  Patient presently denies any chest pains, shortness of breath, nausea, vomiting, abdominal pain or any other associated symptoms presently.  PAST MEDICAL HISTORY:   Past Medical History:  Diagnosis Date  . CAD (coronary artery disease)   . Diabetes mellitus without complication ([place])   . Heart attack ([place])   . Hyperlipidemia   . Hypertension    . Paroxysmal atrial fibrillation ([place])     PAST SURGICAL HISTOIRY:   Past Surgical History:  Procedure Laterality Date  . CARDIAC SURGERY    . JOINT REPLACEMENT      SOCIAL HISTORY:   Social History   Tobacco Use  . 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 Smokeless tobacco: Current User    Types: Snuff  Substance Use Topics  . Alcohol use: Never    Frequency: Never    FAMILY HISTORY:   Family History  Problem Relation Age of Onset  . Diabetes Mother   . Peripheral vascular disease Mother   . COPD Father     DRUG ALLERGIES:   Allergies  Allergen Reactions  . Morphine And Related     REVIEW OF SYSTEMS:   Review of Systems  Constitutional: Negative for fever and weight loss.  HENT: Negative for congestion, nosebleeds and tinnitus.   Eyes: Negative for blurred vision, double vision and redness.  Respiratory: Negative for cough, hemoptysis and shortness of breath.   Cardiovascular: Negative for chest pain, orthopnea, leg swelling and PND.  Gastrointestinal: Negative for abdominal pain, diarrhea, melena, nausea and vomiting.  Genitourinary: Negative for dysuria, hematuria and urgency.  Musculoskeletal: Positive for falls and joint pain (left Knee).  Neurological: Negative for dizziness, tingling, sensory change, focal weakness, seizures, weakness and headaches.  Endo/Heme/Allergies: Negative for polydipsia. Does not bruise/bleed easily.  Psychiatric/Behavioral: Negative for depression and memory loss. The patient is not nervous/anxious.  MEDICATIONS AT HOME:   Prior to Admission medications   Medication Sig Start Date End Date Taking? Authorizing Provider  amiodarone (PACERONE) 200 MG tablet Take 200 mg by mouth daily. 09/14/17  Yes [provider]  amLODipine (NORVASC) 10 MG tablet Take 10 mg by mouth daily. 10/16/17  Yes [provider]  atorvastatin (LIPITOR) 80 MG tablet Take 80 mg by mouth every evening.  09/14/17  Yes [provider]  BRILINTA 90 MG TABS tablet Take 90 mg by mouth 2 (two) times daily. 09/14/17  Yes [provider]  bumetanide (BUMEX) 2 MG tablet Take 2 mg by mouth daily. 09/14/17  Yes [provider]  carvedilol (COREG) 25 MG tablet See admin instructions. Take 1 tablet (25MG ) by mouth every morning and 2 tablets (50MG ) by mouth every night 10/16/17  Yes [provider]  cholecalciferol (VITAMIN D) 1000 units tablet Take 5,000 Units by mouth daily.   Yes [provider]  ezetimibe (ZETIA) 10 MG tablet Take 10 mg by mouth daily. 10/14/17  Yes [provider]  famotidine (PEPCID) 20 MG tablet Take 20 mg by mouth daily. 09/14/17  Yes [provider]  FLUoxetine (PROZAC) 20 MG capsule Take 60 mg by mouth daily. 12/04/17  Yes [provider]  HUMULIN N KWIKPEN 100 UNIT/ML Kiwkpen Inject 15 Units into the skin 2 (two) times daily. 12/10/17  Yes [provider]  RENVELA 800 MG tablet Take 1,600 mg by mouth 3 (three) times daily with meals. 10/14/17  Yes [provider]  VOLTAREN 1 % GEL Apply 2 g topically 3 (three) times daily. 12/04/17  Yes [provider]  warfarin (COUMADIN) 6 MG tablet See admin instructions. Take 2 tablets (15MG ) by mouth daily on Monday, Wednesday, Friday and Saturday and take 2 tablets (12MG ) by mouth daily on Tuesday, Thursday and Sunday 10/16/17  Yes [provider]      VITAL SIGNS:  Blood pressure 140/79, pulse 78, temperature 98.1 F (36.7 C), temperature source Oral, resp. rate 18, height 6\' 2"  (1.88 m), weight (!) 147.4 kg, SpO2 97 %.  PHYSICAL EXAMINATION:  GENERAL:  47 y.o.-year-old obese patient lying in the bed with no acute distress.  EYES: Pupils equal, round, reactive to light and accommodation. No scleral icterus. Extraocular muscles intact.  HEENT: Head atraumatic, normocephalic. Oropharynx and nasopharynx clear.  NECK:  Supple, no jugular venous distention. No thyroid enlargement,  no tenderness.  LUNGS: Normal breath sounds bilaterally, no wheezing, rales, rhonchi . No use of accessory muscles of respiration.  CARDIOVASCULAR: S1, S2, RRR. No murmurs, rubs, gallops, clicks.  ABDOMEN: Soft, nontender, nondistended. Bowel sounds present. No organomegaly or mass.  EXTREMITIES: No pedal edema, cyanosis, or clubbing.  Left knee swelling/warmth with limited ROM.   NEUROLOGIC: Cranial nerves II through XII are intact. No focal motor or sensory deficits appreciated bilaterally  PSYCHIATRIC: The patient is alert and oriented x 3.  SKIN: No obvious rash, lesion, or ulcer.   LABORATORY PANEL:   CBC Recent Labs  Lab 12/16/17 1339  WBC 10.6  HGB 10.1*  HCT 30.4*  PLT 421   ------------------------------------------------------------------------------------------------------------------  Chemistries  Recent Labs  Lab 12/16/17 1339  NA 132*  K 5.2*  CL 101  CO2 21*  GLUCOSE 325*  BUN 44*  CREATININE 3.14*  CALCIUM 9.1  AST 9*  ALT 9  ALKPHOS 93  BILITOT 0.3   ------------------------------------------------------------------------------------------------------------------  Cardiac Enzymes No results for input(s): TROPONINI in the last 168 hours. ------------------------------------------------------------------------------------------------------------------  RADIOLOGY:  Ct Knee Left Wo Contrast  Result Date: 12/16/2017 CLINICAL DATA:  Knee pain and swelling. Joint effusion with gas in the joint. Joint aspiration last week. EXAM: CT OF THE LEFT KNEE WITHOUT CONTRAST TECHNIQUE: Multidetector CT imaging of the left knee was performed according to the standard protocol. Multiplanar CT image reconstructions were also generated. COMPARISON:  Radiographs dated 12/16/2017 FINDINGS: Bones/Joint/Cartilage There are patchy areas of osteopenia in the distal femur and in the patella without discrete cortical destruction. There is a large joint effusion with gas in the  joint. 6 cm Baker's cyst. Ligaments Suboptimally assessed by CT. Intact cruciate and collateral ligaments. Dystrophic calcification in the lateral patellofemoral ligament, probably due to prior remote injury. Muscles and Tendons Negative. Soft tissues Slight nonspecific subcutaneous edema and soft tissue calcifications anterior to the patella and patellar tendon. IMPRESSION: 1. Large joint effusion containing gas, worrisome for infection. 2. Patchy osteopenia of the distal femur and patella which could be due to disuse but the possibility of early osteomyelitis should be considered. Electronically Signed   By: Lorriane Shire M.D.   On: 12/16/2017 15:38   Dg Knee Complete 4 Views Left  Result Date: 12/16/2017 CLINICAL DATA:  LEFT knee pain for a month after fall. Sharp stabbing patella pain. EXAM: LEFT KNEE - COMPLETE 4+ VIEW COMPARISON:  None. FINDINGS: No evidence of fracture, dislocation, or joint effusion. Mild patella spurring. No evidence of arthropathy or other focal bone abnormality. Suprapatellar soft tissue swelling with large effusion and subcutaneous gas. No radiopaque foreign bodies. IMPRESSION: 1. Soft tissue swelling and large suprapatellar joint effusion with subcutaneous gas, gas-forming organism/septic arthropathy not excluded. Recommend correlation with procedural history. 2. No acute osseous process or advanced degenerative change. 3. Acute findings discussed with and reconfirmed by Dr.PAUL MALINDA on 12/16/2017 at 2:39 pm. Electronically Signed   By: Elon Alas M.D.   On: 12/16/2017 14:40     IMPRESSION AND PLAN:   47 year old male with past medical history of coronary artery disease status post stent placement, previous history of MI, ischemic cardiomyopathy status post AICD, paroxysmal atrial fibrillation, diabetes, chronic kidney disease stage IV who presents to the hospital due to left knee pain.  1.  Left knee pain/swelling- suspected to be secondary to septic arthritis.   Patient had a CT scan of the left knee which is suggestive of large joint effusion containing gas.  A arthrocentesis has been done by the ER physician but the results of which are still pending.   - Orthopedics has seen the patient and is considering possible washout in the OR if patient's arthrocentesis is positive for septic arthritis. - Continue broad-spectrum IV antibiotics with vancomycin, Zosyn and further care as per orthopedics.  2.  Hyperkalemia-secondary to patient's chronic kidney disease stage IV. - I will start the patient on some Veltassa, follow potassium level.  3.  Chronic kidney disease stage IV-patient's creatinine is close to baseline and will continue to monitor. - Patient will benefit from outpatient nephrology referral  4.  Paroxysmal atrial fibrillation-currently in a normal sinus rhythm.  Continue amiodarone, carvedilol. -Hold Coumadin for now as patient Thong need surgical intervention for his left knee pain.  5.  History of coronary artery disease status post stent placement- patient had a MI with stent placement in December 2018 at [facility], atorvastatin, carvedilol.  6.  Diabetes type 2 insulin-dependent- blood sugars are uncontrolled.  Patient's last hemoglobin A1c as per him was 11. - I will check a hemoglobin  A1c, get a diabetes coordinator consult.  Start him on some low-dose Levemir, place him on some sliding scale insulin.  7.  History of ischemic cardiomyopathy-clinically patient is not in congestive heart failure.  Continue Bumex, carvedilol.  8.  Depression-continue Prozac.  9.  GERD-continue Pepcid.  Thank you so much for the consultation and will follow along with you.  All the records are reviewed and case discussed with Consulting provider. Management plans discussed with the patient, family and they are in agreement.  CODE STATUS: Full code  TOTAL TIME TAKING CARE OF THIS PATIENT: 45 minutes.    Henreitta Leber M.D on  12/16/2017 at 7:29 PM  Between 7am to 6pm - Pager - [phone]  After 6pm go to [email or web address] - password [email or web address] [facility]  Office  [phone]  CC: Primary care Physician: Patient, No Pcp Per

## 2017-12-16 NOTE — Progress Notes (Signed)
Received call from [place] requesting lab results. Per poison control they spoke with ED nurse earlier today around 1400 hour and asked to have AST and ALT repeated. No orders seen for repeat. [facility] Doc paged.

## 2017-12-16 NOTE — ED Notes (Signed)
Christina RN, aware of bed assigned  

## 2017-12-16 NOTE — H&P (Addendum)
PREOPERATIVE H&P  Chief Complaint: left knee infection  HPI: Edwin [name] is a 47 y.o. male with multiple medical comorbidities who presents to the ER today with pain and swelling in the left knee.  Patient explains that he has dropfoot and decreased sensation in the left foot after being on life support last December at [facility].  Patient was in the hospital for over a month after having a heart attack.  More recently he explains he was going upstairs at his home and his daughter's dog knocked him down.  He injured his left knee.  He experienced swelling.  He went to [facility] and the knee was aspirated and cultures were negative.  X-rays were taken.  The patient states that no further intervention was deemed necessary by [facility].  However the patient has had persistent pain and swelling in his left knee which is become progressively worse.  He presented to the ER today for evaluation of his left knee.  Patient is on Coumadin for atrial fibrillation.  Past Medical History:  Diagnosis Date  . Diabetes mellitus without complication ([place])   . Heart attack ([place])   . Hyperlipidemia   . Hypertension    Past Surgical History:  Procedure Laterality Date  . CARDIAC SURGERY    . JOINT REPLACEMENT     Social History   Socioeconomic History  . Marital status: Unknown    Spouse name: Not on file  . Number of children: Not on file  . Years of education: Not on file  . Highest education level: Not on file  Occupational History  . Not on file  Social Needs  . Financial resource strain: Not on file  . Food insecurity:    Worry: Not on file    Inability: Not on file  . Transportation needs:    Medical: Not on file    Non-medical: Not on file  Tobacco Use  . 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 Smokeless tobacco: Current User  Substance and Sexual Activity  . Alcohol use: Never    Frequency: Never  . Drug use: Not on file  . Sexual activity: Not on file  Lifestyle  . Physical activity:    Days per week:  Not on file    Minutes per session: Not on file  . Stress: Not on file  Relationships  . Social connections:    Talks on phone: Not on file    Gets together: Not on file    Attends religious service: Not on file    Active member of club or organization: Not on file    Attends meetings of clubs or organizations: Not on file    Relationship status: Not on file  Other Topics Concern  . Not on file  Social History Narrative  . Not on file   No family history on file. Allergies  Allergen Reactions  . Morphine And Related    Prior to Admission medications   Medication Sig Start Date End Date Taking? Authorizing Provider  amiodarone (PACERONE) 200 MG tablet Take 200 mg by mouth daily. 09/14/17  Yes [provider]  amLODipine (NORVASC) 10 MG tablet Take 10 mg by mouth daily. 10/16/17  Yes [provider]  atorvastatin (LIPITOR) 80 MG tablet Take 80 mg by mouth every evening.  09/14/17  Yes [provider]  BRILINTA 90 MG TABS tablet Take 90 mg by mouth 2 (two) times daily. 09/14/17  Yes [provider]  bumetanide (BUMEX) 2 MG tablet Take 2  mg by mouth daily. 09/14/17  Yes [provider]  carvedilol (COREG) 25 MG tablet See admin instructions. Take 1 tablet (25MG ) by mouth every morning and 2 tablets (50MG ) by mouth every night 10/16/17  Yes [provider]  cholecalciferol (VITAMIN D) 1000 units tablet Take 5,000 Units by mouth daily.   Yes [provider]  ezetimibe (ZETIA) 10 MG tablet Take 10 mg by mouth daily. 10/14/17  Yes [provider]  famotidine (PEPCID) 20 MG tablet Take 20 mg by mouth daily. 09/14/17  Yes [provider]  FLUoxetine (PROZAC) 20 MG capsule Take 60 mg by mouth daily. 12/04/17  Yes [provider]  HUMULIN N KWIKPEN 100 UNIT/ML Kiwkpen Inject 15 Units into the skin 2 (two) times daily. 12/10/17  Yes [provider]  RENVELA 800 MG tablet Take 1,600 mg by mouth 3 (three) times  daily with meals. 10/14/17  Yes [provider]  VOLTAREN 1 % GEL Apply 2 g topically 3 (three) times daily. 12/04/17  Yes [provider]  warfarin (COUMADIN) 6 MG tablet See admin instructions. Take 2 tablets (15MG ) by mouth daily on Monday, Wednesday, Friday and Saturday and take 2 tablets (12MG ) by mouth daily on Tuesday, Thursday and Sunday 10/16/17  Yes [provider]     Positive ROS: All other systems have been reviewed and were otherwise negative with the exception of those mentioned in the HPI and as above.  Physical Exam: General: Alert, no acute distress Cardiovascular: Regular rate and rhythm, no murmurs rubs or gallops.  No pedal edema Respiratory: Clear to auscultation bilaterally, no wheezes rales or rhonchi. No cyanosis, no use of accessory musculature GI: No organomegaly, abdomen is soft and non-tender nondistended with positive bowel sounds. Skin: Skin intact, no lesions within the operative field. Neurologic: Sensation is absent in the left foot.   Psychiatric: Patient is competent for consent with normal mood and affect Lymphatic: No cervical lymphadenopathy  MUSCULOSKELETAL: Left knee: Patient's knee has recently been aspirated by the ER physician here at [facility].  The knee is covered with Betadine.  Patient has swelling in the left knee but the knee is not tense.  There is anterior medial ecchymosis that the patient states is chronic after a fall while in the hospital at [facility]  Patient has range of motion from 0 to 60 degrees today with moderate pain.  Assessment: Left knee pain and swelling concerning for possible infection  Plan: Plan for Procedure(s): Patient's x-rays today showed air in the left knee joint.  A CT scan confirmed a large knee effusion with gas.  The knee has been aspirated and sent to the lab for cell count, Gram stain and culture.  I am admitting this patient to the orthopedic service for possible I&D tomorrow.  Patient will  stop his Coumadin.  I am going to order vitamin K to reduce his INR which on admission is 2.1.  Patient's sed rate is 115.  His white blood cell count is 10.6.  Patient is afebrile with stable vital signs.  I am going to ask the hospitalist service to see the patient and clear him for potential surgical I&D tomorrow.  Patient is hyperkalemic, hyponatremic and has a glucose of 325 with a creatinine of 3.14.   Will follow the results for the knee aspirate.  NPO after midnight for possible surgery tomorrow.    Thornton Park, MD   12/16/2017 6:42 PM

## 2017-12-17 ENCOUNTER — Inpatient Hospital Stay: Payer: Medicare Other | Admitting: Anesthesiology

## 2017-12-17 ENCOUNTER — Encounter
Admission: EM | Disposition: A | Discharge status: Home Health | Payer: Self-pay | Source: Home / Self Care | Attending: Orthopedic Surgery

## 2017-12-17 ENCOUNTER — Other Ambulatory Visit: Payer: Self-pay

## 2017-12-17 HISTORY — PX: IRRIGATION AND DEBRIDEMENT KNEE: SHX5185

## 2017-12-17 LAB — CBC
HEMATOCRIT: 26 % — AB (ref 40.0–52.0)
Hemoglobin: 8.6 g/dL — ABNORMAL LOW (ref 13.0–18.0)
MCH: 26.8 pg (ref 26.0–34.0)
MCHC: 33.2 g/dL (ref 32.0–36.0)
MCV: 80.8 fL (ref 80.0–100.0)
Platelets: 374 10*3/uL (ref 150–440)
RBC: 3.21 MIL/uL — ABNORMAL LOW (ref 4.40–5.90)
RDW: 16.7 % — AB (ref 11.5–14.5)
WBC: 8.1 10*3/uL (ref 3.8–10.6)

## 2017-12-17 LAB — HEPATIC FUNCTION PANEL
ALT: 11 U/L (ref 0–44)
AST: 10 U/L — AB (ref 15–41)
Albumin: 2.8 g/dL — ABNORMAL LOW (ref 3.5–5.0)
Alkaline Phosphatase: 91 U/L (ref 38–126)
BILIRUBIN TOTAL: 0.3 mg/dL (ref 0.3–1.2)
Total Protein: 8.2 g/dL — ABNORMAL HIGH (ref 6.5–8.1)

## 2017-12-17 LAB — PROTIME-INR
INR: 1.8
Prothrombin Time: 20.7 seconds — ABNORMAL HIGH (ref 11.4–15.2)

## 2017-12-17 LAB — ALT: ALT: 8 U/L (ref 0–44)

## 2017-12-17 LAB — BASIC METABOLIC PANEL
Anion gap: 7 (ref 5–15)
BUN: 42 mg/dL — AB (ref 6–20)
CALCIUM: 8.7 mg/dL — AB (ref 8.9–10.3)
CO2: 25 mmol/L (ref 22–32)
CREATININE: 3.12 mg/dL — AB (ref 0.61–1.24)
Chloride: 102 mmol/L (ref 98–111)
GFR calc Af Amer: 25 mL/min — ABNORMAL LOW (ref >60)
GFR, EST NON AFRICAN AMERICAN: 22 mL/min — AB (ref >60)
Glucose, Bld: 380 mg/dL — ABNORMAL HIGH (ref 70–99)
POTASSIUM: 5.1 mmol/L (ref 3.5–5.1)
SODIUM: 134 mmol/L — AB (ref 135–145)

## 2017-12-17 LAB — TYPE AND SCREEN
ABO/RH(D): O POS
Antibody Screen: NEGATIVE

## 2017-12-17 LAB — SURGICAL PCR SCREEN
MRSA, PCR: NEGATIVE
Staphylococcus aureus: NEGATIVE

## 2017-12-17 LAB — GLUCOSE, CAPILLARY
GLUCOSE-CAPILLARY: 218 mg/dL — AB (ref 70–99)
GLUCOSE-CAPILLARY: 308 mg/dL — AB (ref 70–99)
Glucose-Capillary: 230 mg/dL — ABNORMAL HIGH (ref 70–99)
Glucose-Capillary: 233 mg/dL — ABNORMAL HIGH (ref 70–99)
Glucose-Capillary: 488 mg/dL — ABNORMAL HIGH (ref 70–99)
Glucose-Capillary: 518 mg/dL (ref 70–99)

## 2017-12-17 LAB — APTT: APTT: 52 s — AB (ref 24–36)

## 2017-12-17 LAB — AST: AST: 9 U/L — ABNORMAL LOW (ref 15–41)

## 2017-12-17 LAB — SYNOVIAL FLUID, CRYSTAL

## 2017-12-17 SURGERY — IRRIGATION AND DEBRIDEMENT KNEE
Anesthesia: General | Site: Knee | Laterality: Left

## 2017-12-17 MED ORDER — FENTANYL CITRATE (PF) 100 MCG/2ML IJ SOLN
INTRAMUSCULAR | Status: DC | PRN
  Administered 2017-12-17: 50 ug via INTRAVENOUS
  Administered 2017-12-17 (×2): 25 ug via INTRAVENOUS

## 2017-12-17 MED ORDER — LIDOCAINE HCL (CARDIAC) PF 100 MG/5ML IV SOSY
PREFILLED_SYRINGE | INTRAVENOUS | Status: DC | PRN
  Administered 2017-12-17: 60 mg via INTRAVENOUS

## 2017-12-17 MED ORDER — GABAPENTIN 300 MG PO CAPS
300.0000 mg | ORAL_CAPSULE | Freq: Three times a day (TID) | ORAL | Status: DC
  Administered 2017-12-17 (×2): 300 mg via ORAL
  Filled 2017-12-17 (×3): qty 1

## 2017-12-17 MED ORDER — MAGNESIUM CITRATE PO SOLN
1.0000 | Freq: Once | ORAL | Status: DC | PRN
  Filled 2017-12-17: qty 296

## 2017-12-17 MED ORDER — BUPIVACAINE-EPINEPHRINE (PF) 0.5% -1:200000 IJ SOLN
INTRAMUSCULAR | Status: AC
  Filled 2017-12-17: qty 30

## 2017-12-17 MED ORDER — LIDOCAINE HCL (PF) 1 % IJ SOLN
INTRAMUSCULAR | Status: DC | PRN
  Administered 2017-12-17: 5 mL

## 2017-12-17 MED ORDER — INSULIN ASPART 100 UNIT/ML ~~LOC~~ SOLN: 0.0000 [IU] | Freq: Every day | SUBCUTANEOUS | Status: DC

## 2017-12-17 MED ORDER — MENTHOL 3 MG MT LOZG
1.0000 | LOZENGE | OROMUCOSAL | Status: DC | PRN
  Filled 2017-12-17: qty 9

## 2017-12-17 MED ORDER — WARFARIN SODIUM 6 MG PO TABS
12.0000 mg | ORAL_TABLET | ORAL | Status: DC
Start: 2017-12-17 — End: 2017-12-20
  Administered 2017-12-17: 12 mg via ORAL
  Filled 2017-12-17: qty 2

## 2017-12-17 MED ORDER — BUPIVACAINE-EPINEPHRINE (PF) 0.5% -1:200000 IJ SOLN
INTRAMUSCULAR | Status: DC | PRN
  Administered 2017-12-17: 30 mL

## 2017-12-17 MED ORDER — METHOCARBAMOL 500 MG PO TABS
500.0000 mg | ORAL_TABLET | Freq: Four times a day (QID) | ORAL | Status: DC | PRN
  Administered 2017-12-17: 500 mg via ORAL
  Filled 2017-12-17: qty 1

## 2017-12-17 MED ORDER — ACETAMINOPHEN 10 MG/ML IV SOLN
INTRAVENOUS | Status: AC
  Filled 2017-12-17: qty 100

## 2017-12-17 MED ORDER — WARFARIN SODIUM 7.5 MG PO TABS
15.0000 mg | ORAL_TABLET | ORAL | Status: DC
  Administered 2017-12-18 – 2017-12-19 (×2): 15 mg via ORAL
  Filled 2017-12-17 (×2): qty 2

## 2017-12-17 MED ORDER — PROPOFOL 10 MG/ML IV BOLUS
INTRAVENOUS | Status: DC | PRN
  Administered 2017-12-17: 200 mg via INTRAVENOUS

## 2017-12-17 MED ORDER — INSULIN ASPART 100 UNIT/ML ~~LOC~~ SOLN
0.0000 [IU] | Freq: Three times a day (TID) | SUBCUTANEOUS | Status: DC
  Administered 2017-12-18: 7 [IU] via SUBCUTANEOUS
  Administered 2017-12-18: 4 [IU] via SUBCUTANEOUS
  Administered 2017-12-19 (×2): 20 [IU] via SUBCUTANEOUS
  Administered 2017-12-19: 7 [IU] via SUBCUTANEOUS
  Administered 2017-12-19: 20 [IU] via SUBCUTANEOUS
  Administered 2017-12-20: 3 [IU] via SUBCUTANEOUS
  Filled 2017-12-17 (×7): qty 1

## 2017-12-17 MED ORDER — MIDAZOLAM HCL 2 MG/2ML IJ SOLN
INTRAMUSCULAR | Status: AC
  Filled 2017-12-17: qty 2

## 2017-12-17 MED ORDER — VANCOMYCIN HCL 10 G IV SOLR
2000.0000 mg | INTRAVENOUS | Status: DC
  Administered 2017-12-17 – 2017-12-19 (×3): 2000 mg via INTRAVENOUS
  Filled 2017-12-17 (×3): qty 2000

## 2017-12-17 MED ORDER — ONDANSETRON HCL 4 MG PO TABS: 4.0000 mg | ORAL_TABLET | Freq: Four times a day (QID) | ORAL | Status: DC | PRN

## 2017-12-17 MED ORDER — SODIUM CHLORIDE 0.9 % IV SOLN
INTRAVENOUS | Status: DC
  Administered 2017-12-17 – 2017-12-19 (×3): via INTRAVENOUS

## 2017-12-17 MED ORDER — DEXAMETHASONE SODIUM PHOSPHATE 10 MG/ML IJ SOLN
INTRAMUSCULAR | Status: AC
  Filled 2017-12-17: qty 1

## 2017-12-17 MED ORDER — WARFARIN - PHYSICIAN DOSING INPATIENT: Freq: Every day | Status: DC

## 2017-12-17 MED ORDER — DEXAMETHASONE SODIUM PHOSPHATE 10 MG/ML IJ SOLN
INTRAMUSCULAR | Status: DC | PRN
  Administered 2017-12-17: 5 mg via INTRAVENOUS

## 2017-12-17 MED ORDER — ONDANSETRON HCL 4 MG/2ML IJ SOLN
INTRAMUSCULAR | Status: DC | PRN
  Administered 2017-12-17: 4 mg via INTRAVENOUS

## 2017-12-17 MED ORDER — PIPERACILLIN SOD-TAZOBACTAM SO 2.25 (2-0.25) G IV SOLR
4.5000 g | Freq: Three times a day (TID) | INTRAVENOUS | Status: DC
  Administered 2017-12-17 – 2017-12-18 (×3): 4.5 g via INTRAVENOUS
  Filled 2017-12-17 (×5): qty 4.5

## 2017-12-17 MED ORDER — SODIUM CHLORIDE 0.9 % IV SOLN
Freq: Three times a day (TID) | INTRAVENOUS | Status: DC
  Administered 2017-12-17: 10:00:00 via INTRAVENOUS
  Filled 2017-12-17: qty 100
  Filled 2017-12-17: qty 0
  Filled 2017-12-17 (×2): qty 100

## 2017-12-17 MED ORDER — DOCUSATE SODIUM 100 MG PO CAPS
100.0000 mg | ORAL_CAPSULE | Freq: Two times a day (BID) | ORAL | Status: DC
  Administered 2017-12-17 – 2017-12-20 (×5): 100 mg via ORAL
  Filled 2017-12-17 (×6): qty 1

## 2017-12-17 MED ORDER — BISACODYL 5 MG PO TBEC: 10.0000 mg | DELAYED_RELEASE_TABLET | Freq: Every day | ORAL | Status: DC | PRN

## 2017-12-17 MED ORDER — MIDAZOLAM HCL 2 MG/2ML IJ SOLN
INTRAMUSCULAR | Status: DC | PRN
  Administered 2017-12-17: 2 mg via INTRAVENOUS

## 2017-12-17 MED ORDER — SENNOSIDES-DOCUSATE SODIUM 8.6-50 MG PO TABS: 1.0000 | ORAL_TABLET | Freq: Every evening | ORAL | Status: DC | PRN

## 2017-12-17 MED ORDER — ACETAMINOPHEN 325 MG PO TABS: 325.0000 mg | ORAL_TABLET | Freq: Four times a day (QID) | ORAL | Status: DC | PRN

## 2017-12-17 MED ORDER — HYDROCODONE-ACETAMINOPHEN 7.5-325 MG PO TABS: 1.0000 | ORAL_TABLET | ORAL | Status: DC | PRN

## 2017-12-17 MED ORDER — HYDROCODONE-ACETAMINOPHEN 5-325 MG PO TABS
1.0000 | ORAL_TABLET | ORAL | Status: DC | PRN
  Administered 2017-12-17 – 2017-12-20 (×6): 2 via ORAL
  Filled 2017-12-17 (×6): qty 2

## 2017-12-17 MED ORDER — INSULIN REGULAR HUMAN 100 UNIT/ML IJ SOLN
10.0000 [IU] | Freq: Once | INTRAMUSCULAR | Status: AC
  Administered 2017-12-17: 10 [IU] via INTRAVENOUS
  Filled 2017-12-17: qty 10

## 2017-12-17 MED ORDER — VITAMIN K1 10 MG/ML IJ SOLN
10.0000 mg | Freq: Once | INTRAVENOUS | Status: AC
  Administered 2017-12-17: 10 mg via INTRAVENOUS
  Filled 2017-12-17: qty 1

## 2017-12-17 MED ORDER — ACETAMINOPHEN 10 MG/ML IV SOLN
INTRAVENOUS | Status: DC | PRN
  Administered 2017-12-17: 1000 mg via INTRAVENOUS

## 2017-12-17 MED ORDER — DIPHENHYDRAMINE HCL 12.5 MG/5ML PO ELIX: 12.5000 mg | ORAL_SOLUTION | ORAL | Status: DC | PRN

## 2017-12-17 MED ORDER — NEOMYCIN-POLYMYXIN B GU 40-200000 IR SOLN
Status: AC
  Filled 2017-12-17: qty 20

## 2017-12-17 MED ORDER — TRAMADOL HCL 50 MG PO TABS
50.0000 mg | ORAL_TABLET | Freq: Four times a day (QID) | ORAL | Status: DC
  Administered 2017-12-17 – 2017-12-20 (×12): 50 mg via ORAL
  Filled 2017-12-17 (×12): qty 1

## 2017-12-17 MED ORDER — INSULIN ASPART 100 UNIT/ML ~~LOC~~ SOLN
0.0000 [IU] | Freq: Three times a day (TID) | SUBCUTANEOUS | Status: DC
  Administered 2017-12-17: 15 [IU] via SUBCUTANEOUS
  Filled 2017-12-17: qty 1

## 2017-12-17 MED ORDER — FENTANYL CITRATE (PF) 100 MCG/2ML IJ SOLN
INTRAMUSCULAR | Status: AC
  Filled 2017-12-17: qty 2

## 2017-12-17 MED ORDER — PIPERACILLIN-TAZOBACTAM 3.375 G IVPB: 3.3750 g | Freq: Three times a day (TID) | INTRAVENOUS | Status: DC

## 2017-12-17 MED ORDER — WARFARIN SODIUM 7.5 MG PO TABS: 15.0000 mg | ORAL_TABLET | ORAL | Status: DC

## 2017-12-17 MED ORDER — WARFARIN - PHARMACIST DOSING INPATIENT
Freq: Every day | Status: DC
  Administered 2017-12-19: 1

## 2017-12-17 MED ORDER — ALLOPURINOL 100 MG PO TABS
50.0000 mg | ORAL_TABLET | ORAL | Status: DC
  Administered 2017-12-17 – 2017-12-19 (×2): 50 mg via ORAL
  Filled 2017-12-17 (×2): qty 0.5

## 2017-12-17 MED ORDER — METHOCARBAMOL 1000 MG/10ML IJ SOLN
500.0000 mg | Freq: Four times a day (QID) | INTRAVENOUS | Status: DC | PRN
  Filled 2017-12-17: qty 5

## 2017-12-17 MED ORDER — COLCHICINE 0.6 MG PO TABS: 0.6000 mg | ORAL_TABLET | Freq: Two times a day (BID) | ORAL | Status: DC

## 2017-12-17 MED ORDER — PHENYLEPHRINE HCL 10 MG/ML IJ SOLN
INTRAMUSCULAR | Status: DC | PRN
  Administered 2017-12-17 (×5): 100 ug via INTRAVENOUS

## 2017-12-17 MED ORDER — LIDOCAINE HCL (PF) 1 % IJ SOLN
INTRAMUSCULAR | Status: AC
  Filled 2017-12-17: qty 30

## 2017-12-17 MED ORDER — ONDANSETRON HCL 4 MG/2ML IJ SOLN
INTRAMUSCULAR | Status: AC
  Filled 2017-12-17: qty 2

## 2017-12-17 MED ORDER — FENTANYL CITRATE (PF) 100 MCG/2ML IJ SOLN
INTRAMUSCULAR | Status: AC
  Administered 2017-12-17: 25 ug via INTRAVENOUS
  Filled 2017-12-17: qty 2

## 2017-12-17 MED ORDER — INSULIN DETEMIR 100 UNIT/ML ~~LOC~~ SOLN
20.0000 [IU] | Freq: Every day | SUBCUTANEOUS | Status: DC
  Administered 2017-12-17: 20 [IU] via SUBCUTANEOUS
  Filled 2017-12-17 (×2): qty 0.2

## 2017-12-17 MED ORDER — WARFARIN SODIUM 6 MG PO TABS: 12.0000 mg | ORAL_TABLET | ORAL | Status: DC

## 2017-12-17 MED ORDER — PROPOFOL 10 MG/ML IV BOLUS
INTRAVENOUS | Status: AC
  Filled 2017-12-17: qty 40

## 2017-12-17 MED ORDER — PROMETHAZINE HCL 25 MG/ML IJ SOLN: 6.2500 mg | INTRAMUSCULAR | Status: DC | PRN

## 2017-12-17 MED ORDER — INSULIN REGULAR HUMAN 100 UNIT/ML IJ SOLN
20.0000 [IU] | Freq: Once | INTRAMUSCULAR | Status: AC
  Administered 2017-12-17: 20 [IU] via INTRAVENOUS
  Filled 2017-12-17: qty 10

## 2017-12-17 MED ORDER — PHENOL 1.4 % MT LIQD
1.0000 | OROMUCOSAL | Status: DC | PRN
  Filled 2017-12-17: qty 177

## 2017-12-17 MED ORDER — FENTANYL CITRATE (PF) 100 MCG/2ML IJ SOLN
25.0000 ug | INTRAMUSCULAR | Status: DC | PRN
  Administered 2017-12-17: 25 ug via INTRAVENOUS
  Administered 2017-12-17: 50 ug via INTRAVENOUS
  Administered 2017-12-17: 25 ug via INTRAVENOUS

## 2017-12-17 MED ORDER — ONDANSETRON HCL 4 MG/2ML IJ SOLN: 4.0000 mg | Freq: Four times a day (QID) | INTRAMUSCULAR | Status: DC | PRN

## 2017-12-17 SURGICAL SUPPLY — 64 items
ADAPTER IRRIG TUBE 2 SPIKE SOL (ADAPTER) IMPLANT
BANDAGE ACE 6X5 VEL STRL LF (GAUZE/BANDAGES/DRESSINGS) ×4 IMPLANT
BLADE SURG 15 STRL LF DISP TIS (BLADE) ×2 IMPLANT
BLADE SURG 15 STRL SS (BLADE) ×2
BNDG ESMARK 4X12 TAN STRL LF (GAUZE/BANDAGES/DRESSINGS) ×4 IMPLANT
BUR RADIUS 3.5 (BURR) ×4 IMPLANT
BUR RADIUS 4.0X18.5 (BURR) ×4 IMPLANT
BUR RADIUS 5.5 (BURR) ×4 IMPLANT
CANISTER SUCT 1200ML W/VALVE (MISCELLANEOUS) ×4 IMPLANT
CANISTER SUCT 3000ML PPV (MISCELLANEOUS) IMPLANT
CANISTER SUCT LVC 12 LTR MEDI- (MISCELLANEOUS) IMPLANT
CAST PADDING 3X4FT ST 30246 (SOFTGOODS)
CLOSURE WOUND 1/2 X4 (GAUZE/BANDAGES/DRESSINGS) ×1
CNTNR SPEC 2.5X3XGRAD LEK (MISCELLANEOUS) ×2
CONT SPEC 4OZ STER OR WHT (MISCELLANEOUS) ×2
CONTAINER SPEC 2.5X3XGRAD LEK (MISCELLANEOUS) ×2 IMPLANT
COOLER POLAR GLACIER W/PUMP (MISCELLANEOUS) IMPLANT
CUFF TOURN 24 STER (MISCELLANEOUS) IMPLANT
CUFF TOURN 30 STER DUAL PORT (MISCELLANEOUS) ×4 IMPLANT
CUFF TOURN 34 STER (MISCELLANEOUS) IMPLANT
DEVICE SUCT BLK HOLE OR FLOOR (MISCELLANEOUS) IMPLANT
DRAPE IMP U-DRAPE 54X76 (DRAPES) ×4 IMPLANT
DRAPE U-SHAPE 47X51 STRL (DRAPES) ×4 IMPLANT
DURAPREP 26ML APPLICATOR (WOUND CARE) ×12 IMPLANT
ELECT REM PT RETURN 9FT ADLT (ELECTROSURGICAL) ×4
ELECTRODE REM PT RTRN 9FT ADLT (ELECTROSURGICAL) ×2 IMPLANT
GAUZE PETRO XEROFOAM 1X8 (MISCELLANEOUS) ×4 IMPLANT
GAUZE SPONGE 4X4 12PLY STRL (GAUZE/BANDAGES/DRESSINGS) ×4 IMPLANT
GLOVE BIOGEL PI IND STRL 9 (GLOVE) ×2 IMPLANT
GLOVE BIOGEL PI INDICATOR 9 (GLOVE) ×2
GLOVE SURG 9.0 ORTHO LTXF (GLOVE) ×8 IMPLANT
GOWN STRL REUS TWL 2XL XL LVL4 (GOWN DISPOSABLE) ×4 IMPLANT
GOWN STRL REUS W/ TWL LRG LVL3 (GOWN DISPOSABLE) ×2 IMPLANT
GOWN STRL REUS W/TWL LRG LVL3 (GOWN DISPOSABLE) ×2
IV LACTATED RINGER IRRG 3000ML (IV SOLUTION) ×12
IV LR IRRIG 3000ML ARTHROMATIC (IV SOLUTION) ×12 IMPLANT
KIT TURNOVER KIT A (KITS) ×4 IMPLANT
MANIFOLD NEPTUNE II (INSTRUMENTS) ×4 IMPLANT
MAT ABSORB  FLUID 56X50 GRAY (MISCELLANEOUS) ×2
MAT ABSORB FLUID 56X50 GRAY (MISCELLANEOUS) ×2 IMPLANT
NEEDLE HYPO 22GX1.5 SAFETY (NEEDLE) ×4 IMPLANT
NS IRRIG 1000ML POUR BTL (IV SOLUTION) ×4 IMPLANT
PACK ARTHROSCOPY KNEE (MISCELLANEOUS) ×4 IMPLANT
PACK EXTREMITY ARMC (MISCELLANEOUS) ×4 IMPLANT
PAD ABD DERMACEA PRESS 5X9 (GAUZE/BANDAGES/DRESSINGS) ×12 IMPLANT
PAD CAST CTTN 3X4 STRL (SOFTGOODS) IMPLANT
PAD WRAPON POLAR KNEE (MISCELLANEOUS) ×2 IMPLANT
PULSAVAC PLUS IRRIG FAN TIP (DISPOSABLE)
SET TUBE SUCT SHAVER OUTFL 24K (TUBING) ×4 IMPLANT
SET TUBE TIP INTRA-ARTICULAR (MISCELLANEOUS) ×4 IMPLANT
SOL .9 NS 3000ML IRR  AL (IV SOLUTION)
SOL .9 NS 3000ML IRR UROMATIC (IV SOLUTION) IMPLANT
SOL PREP PVP 2OZ (MISCELLANEOUS)
SOLUTION PREP PVP 2OZ (MISCELLANEOUS) IMPLANT
STOCKINETTE STRL 4IN 9604848 (GAUZE/BANDAGES/DRESSINGS) ×4 IMPLANT
STRIP CLOSURE SKIN 1/2X4 (GAUZE/BANDAGES/DRESSINGS) ×3 IMPLANT
SUT ETHILON 4-0 (SUTURE) ×2
SUT ETHILON 4-0 FS2 18XMFL BLK (SUTURE) ×2
SUTURE ETHLN 4-0 FS2 18XMF BLK (SUTURE) ×2 IMPLANT
TIP FAN IRRIG PULSAVAC PLUS (DISPOSABLE) IMPLANT
TUBING ARTHRO INFLOW-ONLY STRL (TUBING) ×4 IMPLANT
WAND HAND CNTRL MULTIVAC 50 (MISCELLANEOUS) ×4 IMPLANT
WAND HAND CNTRL MULTIVAC 90 (MISCELLANEOUS) ×4 IMPLANT
WRAPON POLAR PAD KNEE (MISCELLANEOUS) ×4

## 2017-12-17 NOTE — Progress Notes (Signed)
Pt's night time CBG=488. Night time sliding scale coverage only up to 400. Dr. Jannifer Franklin paged, made aware, ordered for one time dose regular insulin (Novolin R) 10 units IV and to give scheduled Levemir 20 units [place] and recheck CBG after an hour. Will continue to monitor.

## 2017-12-17 NOTE — Op Note (Signed)
12/17/2017  2:03 PM  PATIENT:  Edwin [name]    PRE-OPERATIVE DIAGNOSIS:  Left septic knee vs. gout flare   POST-OPERATIVE DIAGNOSIS:  Left septic knee with gout flare, tricompartmental osteoarthritis with focal chondral lesion of the medial femoral condyle and fraying of the medial and lateral menisci  PROCEDURE:  LEFT KNEE ARTHROSCOPIC DEBRIDEMENT AND SYNOVECTOMY, CHONDROPLASTY OF THE MEDIAL FEMORAL CONDYLE   SURGEON:  Thornton Park, MD  ANESTHESIA:   General  PREOPERATIVE INDICATIONS:  Edwin [name] is a  47 y.o. male who presented to the ER with left knee pain and swelling.  Patient had an injury to the left knee a few weeks ago.  He was previously aspirated at [facility] with the aspirate negative for infection.  Patient had increasing pain in the left knee.  An aspiration was performed in the ER here at [facility] last night.  The aspirate showed over 90,000 white blood cells with a left shift and mono sodium urate crystals consistent with gout.  Since x-rays showed intra-articular gas concerning for a septic knee joint.  Discussed with the patient the results of his aspirate this morning.  He does have a history of gout and appears to have a flare of gout in the left knee.  However given the gas is seen on his plain x-rays and CT scan a septic knee joint could not be ruled out.  The decision was made to proceed with an arthroscopic washout of the left knee.  I discussed the risks and benefits of arthroscopic debridement surgery with the patient this AM. The patient understood the risks include but are not limited to infection, bleeding, nerve or blood vessel injury, joint stiffness or loss of motion, persistent pain, weakness or instability,  and the need for further surgery. Medical risks include but are not limited to DVT and pulmonary embolism, myocardial infarction, stroke, pneumonia, respiratory failure and death. Patient understood these risks and wished to proceed.   OPERATIVE  FINDINGS: Large effusion with white colored debris/sediment in it, diffuse intra-articular synovitis, no loose bodies.  5 mm full thickness chondral lesion of the medial femoral condyle.  Tricompartmental arthritis.  Fraying of the medial and lateral meniscus.  OPERATIVE PROCEDURE:   Brought to the operating room placed supine on the operative table.  Patient's left lower extremity was prepped and draped in a sterile fashion.  Patient received vancomycin and Zosyn.  Timeout was performed to verify the patient's name, date of birth, medical record number, correct site of surgery and correct procedure to be performed.  Once all in attendance were in agreement the case began.  The lateral portal was made using 11 blade.  The arthroscope was placed intra-articularly.  A large effusion was drained out of the arthroscopic cannula and collected in a specimen cup.  There was white-colored debris in the effusion fluid.  The fluid appeared yellowish and cloudy.  A culture swab was taken and sent to microbiology for Gram stain and culture.  Fluids a sample was sent to the lab for cell count and crystal analysis.    A medial portal was then created using a spinal needle for localization.  Diffuse synovitis was encountered in the anterior knee.  This was debrided using a 4 .0 resector shaver blade and 90 degree ArthroCare wand.  The knee was then fully extended and the scope placed in the suprapatellar pouch.  Extensive debridement and synovectomy was performed of the suprapatellar pouch.  Adhesions and scar bands were also debrided  in the suprapatellar pouch.  Arthroscopic synovectomy with the ArthroCare wand was then extended into the medial and lateral gutters.    Once the synovectomy was completed visualization of the medial and lateral compartments revealed mild fraying of the medial lateral menisci which were debrided with a 4.0 resector shaver blade.  Patient had tricompartmental osteoarthritis with a  full-thickness chondral lesion in the medial femoral condyle measuring approximately 5 mm in diameter.  A chondroplasty of this lesion was performed. Patient's ACL was intact.  There were no chondral loose bodies in the medial, lateral compartments or posterior knee.  Extensive irrigation of the left knee was performed.  Once all sediment and debris was removed from the knee arthroscopic incisions were removed after final images were taken.  The 2 arthroscopy portals were closed with 4-0 nylon.  Patient advised sterile compressive dressing applied along with a Polar Care sleeve.  Patient was awoken and brought to the PACU in stable condition.  I was scrubbed and present for the entire case and all sharp and instrument counts were correct at the conclusion the case.

## 2017-12-17 NOTE — Progress Notes (Signed)
CBG rechecked an hour post insulin IV, yielded 518. Dr. Jannifer Franklin paged and ordered for another one time dose regular insulin (Novolin R) 20 units IV and recheck CBG after an hour. Pt encouraged to refrain from drinking apple juice tonight. Will continue to monitor.

## 2017-12-17 NOTE — Progress Notes (Signed)
Dr Rosey Bath made aware of pacer/defib

## 2017-12-17 NOTE — Progress Notes (Signed)
ANTICOAGULATION CONSULT NOTE - Initial Consult  Pharmacy Consult for Warfarin  Indication: atrial fibrillation  Allergies  Allergen Reactions  . Morphine And Related     Patient Measurements: Height: 6\' 2"  (188 cm) Weight: (!) 325 lb (147.4 kg) IBW/kg (Calculated) : 82.2 Heparin Dosing Weight:    Vital Signs: Temp: 98.1 F (36.7 C) (09/19 1848) Temp Source: Oral (09/19 1848) BP: 138/68 (09/19 1848) Pulse Rate: 80 (09/19 1848)  Labs: Recent Labs    12/16/17 1339 12/16/17 1442 12/17/17 0257  HGB 10.1*  --  8.6*  HCT 30.4*  --  26.0*  PLT 421  --  374  APTT  --   --  52*  LABPROT  --  23.4* 20.7*  INR  --  2.10 1.80  CREATININE 3.14*  --  3.12*    Estimated Creatinine Clearance: 43.9 mL/min (A) (by C-G formula based on SCr of 3.12 mg/dL (H)).   Medical History: Past Medical History:  Diagnosis Date  . CAD (coronary artery disease)   . Diabetes mellitus without complication ([place])   . Heart attack ([place])   . Hyperlipidemia   . Hypertension   . Paroxysmal atrial fibrillation ([name])     Medications:  Scheduled:  . allopurinol  50 mg Oral QODAY  . amiodarone  200 mg Oral Daily  . amLODipine  10 mg Oral Daily  . atorvastatin  80 mg Oral QPM  . bumetanide  2 mg Oral Daily  . carvedilol  25 mg Oral Q breakfast  . carvedilol  50 mg Oral Q supper  . cholecalciferol  5,000 Units Oral Daily  . docusate sodium  100 mg Oral BID  . ezetimibe  10 mg Oral Daily  . famotidine  20 mg Oral Daily  . FLUoxetine  60 mg Oral Daily  . gabapentin  300 mg Oral TID  . insulin aspart  0-20 Units Subcutaneous TID WC  . insulin aspart  0-5 Units Subcutaneous QHS  . insulin detemir  20 Units Subcutaneous QHS  . patiromer  16.8 g Oral Daily  . sevelamer carbonate  1,600 mg Oral TID WC  . ticagrelor  90 mg Oral BID  . traMADol  50 mg Oral Q6H  . vancomycin variable dose per unstable renal function (pharmacist dosing)   Does not apply See admin instructions  . warfarin  12 mg Oral  Once per day on Sun Tue Thu  . [START ON 12/18/2017] warfarin  15 mg Oral Once per day on Mon Wed Fri Sat  . [START ON 12/18/2017] Warfarin - Pharmacist Dosing Inpatient   Does not apply q1800   Infusions:  . sodium chloride 75 mL/hr at 12/17/17 1640  . ceFEPime (MAXIPIME) IV    . methocarbamol (ROBAXIN) IV    . piperacillin-tazobactam (ZOSYN)  IV 4.5 g (12/17/17 1759)  . vancomycin 250 mL/hr at 12/17/17 1021    Assessment: 47 yo M on Warfarin PTA for Afib. Warfarin had been held for surgery, now restarting. Patient received Vit K 10 mg IV x 1 today 12/17/17. Home warfarin (per Chart review encounter note from 12/04/17):  Warfarin 12 mg Tues,Thur, Sun Warfarin 15 mg Mon, Wed, Fri, Sat  9/19  INR 1.80     Goal of Therapy:  INR 2-3 Monitor platelets by anticoagulation protocol: Yes   Plan:  Will order Home regimen as above.  Patient on Amiodarone (was on it PTA also), Brilinta, Zosyn. F/u INR daily.  Watch for delay in therapeutic INR from Vit K.  Edwin [name] A 12/17/2017,6:55 PM

## 2017-12-17 NOTE — Progress Notes (Signed)
Infectious Disease consult called to Dr. Mauricia Area

## 2017-12-17 NOTE — Progress Notes (Signed)
[place] [facility] [place] NAME: Murvin Chery    MR#:  [id number]  DATE OF BIRTH:  [birth year]  SUBJECTIVE:  CHIEF COMPLAINT:   Chief Complaint  Patient presents with  . Knee Pain  Patient continues to complain of left knee swelling and pain, wife at the bedside, for arthroscopy later today by orthopedic surgery  REVIEW OF SYSTEMS:  CONSTITUTIONAL: No fever, fatigue or weakness.  EYES: No blurred or double vision.  EARS, NOSE, AND THROAT: No tinnitus or ear pain.  RESPIRATORY: No cough, shortness of breath, wheezing or hemoptysis.  CARDIOVASCULAR: No chest pain, orthopnea, edema.  GASTROINTESTINAL: No nausea, vomiting, diarrhea or abdominal pain.  GENITOURINARY: No dysuria, hematuria.  ENDOCRINE: No polyuria, nocturia,  HEMATOLOGY: No anemia, easy bruising or bleeding SKIN: No rash or lesion. MUSCULOSKELETAL: No joint pain or arthritis.   NEUROLOGIC: No tingling, numbness, weakness.  PSYCHIATRY: No anxiety or depression.   ROS  DRUG ALLERGIES:   Allergies  Allergen Reactions  . Morphine And Related     VITALS:  Blood pressure 118/78, pulse 72, temperature 98.4 F (36.9 C), temperature source Oral, resp. rate 18, height 6\' 2"  (1.88 m), weight (!) 147.4 kg, SpO2 100 %.  PHYSICAL EXAMINATION:  GENERAL:  47 y.o.-year-old patient lying in the bed with no acute distress.  EYES: Pupils equal, round, reactive to light and accommodation. No scleral icterus. Extraocular muscles intact.  HEENT: Head atraumatic, normocephalic. Oropharynx and nasopharynx clear.  NECK:  Supple, no jugular venous distention. No thyroid enlargement, no tenderness.  LUNGS: Normal breath sounds bilaterally, no wheezing, rales,rhonchi or crepitation. No use of accessory muscles of respiration.  CARDIOVASCULAR: S1, S2 normal. No murmurs, rubs, or gallops.  ABDOMEN: Soft, nontender, nondistended. Bowel sounds present. No organomegaly or mass.  EXTREMITIES: No pedal edema,  cyanosis, or clubbing.  NEUROLOGIC: Cranial nerves II through XII are intact. Muscle strength 5/5 in all extremities. Sensation intact. Gait not checked.  PSYCHIATRIC: The patient is alert and oriented x 3.  SKIN: No obvious rash, lesion, or ulcer.   Physical Exam LABORATORY PANEL:   CBC Recent Labs  Lab 12/17/17 0257  WBC 8.1  HGB 8.6*  HCT 26.0*  PLT 374   ------------------------------------------------------------------------------------------------------------------  Chemistries  Recent Labs  Lab 12/16/17 1339 12/17/17 0257  NA 132* 134*  K 5.2* 5.1  CL 101 102  CO2 21* 25  GLUCOSE 325* 380*  BUN 44* 42*  CREATININE 3.14* 3.12*  CALCIUM 9.1 8.7*  AST 10*  9* 9*  ALT 11  9 8   ALKPHOS 91  93  --   BILITOT 0.3  0.3  --    ------------------------------------------------------------------------------------------------------------------  Cardiac Enzymes No results for input(s): TROPONINI in the last 168 hours. ------------------------------------------------------------------------------------------------------------------  RADIOLOGY:  Ct Knee Left Wo Contrast  Result Date: 12/16/2017 CLINICAL DATA:  Knee pain and swelling. Joint effusion with gas in the joint. Joint aspiration last week. EXAM: CT OF THE LEFT KNEE WITHOUT CONTRAST TECHNIQUE: Multidetector CT imaging of the left knee was performed according to the standard protocol. Multiplanar CT image reconstructions were also generated. COMPARISON:  Radiographs dated 12/16/2017 FINDINGS: Bones/Joint/Cartilage There are patchy areas of osteopenia in the distal femur and in the patella without discrete cortical destruction. There is a large joint effusion with gas in the joint. 6 cm Baker's cyst. Ligaments Suboptimally assessed by CT. Intact cruciate and collateral ligaments. Dystrophic calcification in the lateral patellofemoral ligament, probably due to prior remote injury. Muscles and Tendons Negative. Soft  tissues Slight nonspecific subcutaneous edema and soft tissue calcifications anterior to the patella and patellar tendon. IMPRESSION: 1. Large joint effusion containing gas, worrisome for infection. 2. Patchy osteopenia of the distal femur and patella which could be due to disuse but the possibility of early osteomyelitis should be considered. Electronically Signed   By: Lorriane Shire M.D.   On: 12/16/2017 15:38   Chest Portable 1 View  Result Date: 12/17/2017 CLINICAL DATA:  Preop EXAM: PORTABLE CHEST 1 VIEW COMPARISON:  None. FINDINGS: Heart is top-normal in size. Nonaneurysmal thoracic aorta. Right atrial and ventricular pacer leads are noted as well as a right ventricular ICD lead. Lungs are clear. Remote left-sided fourth through seventh posterior rib fractures are present. IMPRESSION: No active disease. Electronically Signed   By: Ashley Royalty M.D.   On: 12/17/2017 00:44   Dg Knee Complete 4 Views Left  Result Date: 12/16/2017 CLINICAL DATA:  LEFT knee pain for a month after fall. Sharp stabbing patella pain. EXAM: LEFT KNEE - COMPLETE 4+ VIEW COMPARISON:  None. FINDINGS: No evidence of fracture, dislocation, or joint effusion. Mild patella spurring. No evidence of arthropathy or other focal bone abnormality. Suprapatellar soft tissue swelling with large effusion and subcutaneous gas. No radiopaque foreign bodies. IMPRESSION: 1. Soft tissue swelling and large suprapatellar joint effusion with subcutaneous gas, gas-forming organism/septic arthropathy not excluded. Recommend correlation with procedural history. 2. No acute osseous process or advanced degenerative change. 3. Acute findings discussed with and reconfirmed by Dr.PAUL MALINDA on 12/16/2017 at 2:39 pm. Electronically Signed   By: Elon Alas M.D.   On: 12/16/2017 14:40    ASSESSMENT AND PLAN:  47 year old male with past medical history of coronary artery disease status post stent placement, previous history of MI, ischemic  cardiomyopathy status post AICD, paroxysmal atrial fibrillation, diabetes, chronic kidney disease stage IV who presents to the hospital due to left knee pain.  *Acute Left knee pain/swelling Exact etiology unknown- ? Gout vs infection Status post arthrocentesis in the emergency room-Gram stain/culture negative thus far For arthroscopy later today by orthopedic surgery, continue empiric antibiotics-vancomycin/Zosyn for now, follow-up on cultures  *Acute hyperkalemia Resolved with V[place]  *CKD, IV Cr at [facility] We will arrange for outpatient follow-up with nephrology   *History of paroxysmal atrial fibrillation Stable Continue amiodarone, carvedilol Coumadin on hold for surgical intervention  *Chronic CAD/MI S/P stent placement 12/18 at [facility], atorvastatin, carvedilol  *Chronic IDDM Patient's last hemoglobin A1c as per him was 11 Continue sliding scale insulin with Accu-Cheks per routine, Levemir, diabetic educator consulted  *History of ICM without exacerbation  Continue Bumex, carvedilol  *Chronic Depression without suicidal ideation continue Prozac.  *Chronic GERD esophagitis continue Pepcid  Position pending clinical course  All the records are reviewed and case discussed with Care Management/Social Workerr. Management plans discussed with the patient, family and they are in agreement.  CODE STATUS: full  TOTAL TIME TAKING CARE OF THIS PATIENT: 35 minutes.     POSSIBLE D/C IN 1-2 DAYS, DEPENDING ON CLINICAL CONDITION.   Avel Peace Itzamar Traynor M.D on 12/17/2017   Between 7am to 6pm - Pager - [phone]  After 6pm go to [email or web address] - password [email or web address] [facility]  Office  [phone]  CC: Primary care physician; Patient, No Pcp Per  Note: This dictation was prepared with Dragon dictation along with smaller phrase technology. Any transcriptional errors that result from this process are unintentional.

## 2017-12-17 NOTE — Progress Notes (Signed)
Pharmacist - Prescriber Communication   Zosyn dose modified from 3.375 gm IV Q8H EI to 4.5 gm IV Q8H EI due to BMI greater than 40.   Urania Pearlman A. [place], [place].D., BCPS Clinical Pharmacist 12/17/17 08:13

## 2017-12-17 NOTE — Progress Notes (Signed)
ID Pt in OR

## 2017-12-17 NOTE — Anesthesia Procedure Notes (Signed)
Procedure Name: LMA Insertion Date/Time: 12/17/2017 11:44 AM Performed by: Jonna Clark, CRNA Pre-anesthesia Checklist: Patient identified, Patient being monitored, Timeout performed, Emergency Drugs available and Suction available Patient Re-evaluated:Patient Re-evaluated prior to induction Oxygen Delivery Method: Circle system utilized Preoxygenation: Pre-oxygenation with 100% oxygen Induction Type: IV induction Ventilation: Mask ventilation without difficulty LMA: LMA inserted LMA Size: 4.5 Tube type: Oral Number of attempts: 1 Placement Confirmation: positive ETCO2 and breath sounds checked- equal and bilateral Tube secured with: Tape Dental Injury: Teeth and Oropharynx as per pre-operative assessment

## 2017-12-17 NOTE — Consult Note (Signed)
Reason for Consult: Gout management  Referring Physician: Dr. Earney Hamburg Parson   HPI: 47 year old white male.  History of cardiomyopathy with atrial fibrillation coronary disease and defibrillator.  Renal insufficiency.  Diabetes For 6 years he has had episodic gout.  Usually ankle toe either knee or elbow.  He previously was on allopurinol but stopped it because he was on too many meds.  He has had one episode of kidney stone but was not determined if it was uric acid He usually takes colchicine when he flares For the last month he has had pain and swelling left knee worse recently.  Had aspiration with 90,000 white cells positive monosodium urate crystals.  Sed rate 115.  Total white count 10,600.  Head CT.  Had aspiration but no injection.  Today had lavage.  Cultures been negative.  Other joints are currently flaring.  Most recent creatinine is 3.1 with hemoglobin 8.6.  PMH: Cardiomyopathy.  Renal insufficiency.  Diabetes.  SURGICAL HISTORY: Merchandiser, retail  Family History: No family history of gout  Social History: No cigarettes or alcohol  Allergies:  Allergies  Allergen Reactions  . Morphine And Related     Medications:  Continuous: . sodium chloride 75 mL/hr at 12/17/17 1609  . ceFEPime (MAXIPIME) IV    . methocarbamol (ROBAXIN) IV    . small volume/piggyback builder 25 mL/hr at 12/17/17 1020  . piperacillin-tazobactam (ZOSYN)  IV    . vancomycin 2,000 mg (12/17/17 1018)        ROS: No recent fever or rash.  No GI upset.   PHYSICAL EXAM: Blood pressure (!) 153/75, pulse 72, temperature 97.6 F (36.4 C), temperature source Oral, resp. rate 18, height 6\' 2"  (1.88 m), weight (!) 147.4 kg, SpO2 100 %. Pleasant male.  alert.  Sitting in the bed.  Shoulders move well.  Wrist without synovitis.  Hands without tophi.  Elbows without tophi.  Good range of motion.  Left knee is bandaged.  Right knee without synovitis.  Ankles and toes without synovitis or  tophi.  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  Given his renal disease, would start low-dose.  50 mg every other day [place] give prednisone taper with attention to his glucose if agreeable with attending and hospitalist No colchicine given his level of renal insufficiency We would be glad to help manage his gout as an outpatient upon discharge  Emmaline Kluver 12/17/2017, 4:24 PM

## 2017-12-17 NOTE — Progress Notes (Signed)
Consult called to Dr. Precious Reel for Rheumatology.

## 2017-12-17 NOTE — Progress Notes (Addendum)
Subjective:  HD #2  Patient reports left pain as moderate.  Gram stain on the patient's knee aspirate is showing abundant white blood cells predominantly PMNs.  No organisms were seen.  The aspirate cell count is 90,772 with 94% neutrophils.  The aspirate is also showing extracellular monosodium urate crystals.  Intra-articular gas was seen on his plain xrays on CT scan of the left knee in the ER.  Objective:   VITALS:   Vitals:   12/17/17 0026 12/17/17 0144 12/17/17 0244 12/17/17 0506  BP: 130/68 126/85 (!) 112/57 119/64  Pulse: 78 80 73 70  Resp: 18 14 15 12   Temp: 98.5 F (36.9 C) 98.3 F (36.8 C) 97.7 F (36.5 C) 98 F (36.7 C)  TempSrc: Oral Oral Oral Oral  SpO2: 100% 99% 96% 99%  Weight:      Height:        PHYSICAL EXAM: Left knee: Patient skin remains intact.  He still has a moderate effusion.  He has mild tenderness palpation globally around the left knee.  Patient has no sensation light touch in his left foot which is his baseline.  He has sensation of his lower leg.  Patient has palpable pedal pulses.   LABS  Results for orders placed or performed during the hospital encounter of 12/16/17 (from the past 24 hour(s))  CBC with Differential     Status: Abnormal   Collection Time: 12/16/17  1:39 PM  Result Value Ref Range   WBC 10.6 3.8 - 10.6 K/uL   RBC 3.77 (L) 4.40 - 5.90 MIL/uL   Hemoglobin 10.1 (L) 13.0 - 18.0 g/dL   HCT 30.4 (L) 40.0 - 52.0 %   MCV 80.6 80.0 - 100.0 fL   MCH 26.8 26.0 - 34.0 pg   MCHC 33.3 32.0 - 36.0 g/dL   RDW 16.1 (H) 11.5 - 14.5 %   Platelets 421 150 - 440 K/uL   Neutrophils Relative % 76 %   Neutro Abs 8.1 (H) 1.4 - 6.5 K/uL   Lymphocytes Relative 15 %   Lymphs Abs 1.6 1.0 - 3.6 K/uL   Monocytes Relative 7 %   Monocytes Absolute 0.7 0.2 - 1.0 K/uL   Eosinophils Relative 1 %   Eosinophils Absolute 0.1 0 - 0.7 K/uL   Basophils Relative 1 %   Basophils Absolute 0.1 0 - 0.1 K/uL  Comprehensive metabolic panel     Status: Abnormal    Collection Time: 12/16/17  1:39 PM  Result Value Ref Range   Sodium 132 (L) 135 - 145 mmol/L   Potassium 5.2 (H) 3.5 - 5.1 mmol/L   Chloride 101 98 - 111 mmol/L   CO2 21 (L) 22 - 32 mmol/L   Glucose, Bld 325 (H) 70 - 99 mg/dL   BUN 44 (H) 6 - 20 mg/dL   Creatinine, Ser 3.14 (H) 0.61 - 1.24 mg/dL   Calcium 9.1 8.9 - 10.3 mg/dL   Total Protein 8.4 (H) 6.5 - 8.1 g/dL   Albumin 2.9 (L) 3.5 - 5.0 g/dL   AST 9 (L) 15 - 41 U/L   ALT 9 0 - 44 U/L   Alkaline Phosphatase 93 38 - 126 U/L   Total Bilirubin 0.3 0.3 - 1.2 mg/dL   GFR calc non Af Amer 22 (L) >60 mL/min   GFR calc Af Amer 25 (L) >60 mL/min   Anion gap 10 5 - 15  Hepatic function panel     Status: Abnormal   Collection Time: 12/16/17  1:39 PM  Result Value Ref Range   Total Protein 8.2 (H) 6.5 - 8.1 g/dL   Albumin 2.8 (L) 3.5 - 5.0 g/dL   AST 10 (L) 15 - 41 U/L   ALT 11 0 - 44 U/L   Alkaline Phosphatase 91 38 - 126 U/L   Total Bilirubin 0.3 0.3 - 1.2 mg/dL   Bilirubin, Direct <0.1 0.0 - 0.2 mg/dL   Indirect Bilirubin NOT CALCULATED 0.3 - 0.9 mg/dL  Acetaminophen level     Status: Abnormal   Collection Time: 12/16/17  2:36 PM  Result Value Ref Range   Acetaminophen (Tylenol), Serum <10 (L) 10 - 30 ug/mL  Salicylate level     Status: None   Collection Time: 12/16/17  2:36 PM  Result Value Ref Range   Salicylate Lvl <3.1 2.8 - 30.0 mg/dL  Protime-INR     Status: Abnormal   Collection Time: 12/16/17  2:42 PM  Result Value Ref Range   Prothrombin Time 23.4 (H) 11.4 - 15.2 seconds   INR 2.10   Sedimentation rate     Status: Abnormal   Collection Time: 12/16/17  2:42 PM  Result Value Ref Range   Sed Rate 115 (H) 0 - 15 mm/hr  Body fluid culture     Status: None (Preliminary result)   Collection Time: 12/16/17  5:47 PM  Result Value Ref Range   Specimen Description      KNEE LEFT KNEE Performed at [facility], [address], [place], [place] [address]    Special Requests      Normal Performed at [facility], [address] [place], [place] [address]    Gram Stain      ABUNDANT WBC PRESENT, PREDOMINANTLY PMN NO ORGANISMS SEEN Performed at [facility], [place] [address], [place], [place] [address]    Culture PENDING    Report Status PENDING   Synovial cell count + diff, w/ crystals     Status: Abnormal   Collection Time: 12/16/17  5:47 PM  Result Value Ref Range   Color, Synovial YELLOW (A) YELLOW   Appearance-Synovial TURBID (A) CLEAR   Crystals, Fluid EXTRACELLULAR MONOSODIUM URATE CRYSTALS    WBC, Synovial 90,772 (H) 0 - 200 /cu mm   Neutrophil, Synovial 94 %   Lymphocytes-Synovial Fld 2 %   Monocyte-Macrophage-Synovial Fluid 4 %   Eosinophils-Synovial 0 %   Other Cells-SYN 0   Glucose, capillary     Status: Abnormal   Collection Time: 12/16/17 11:17 PM  Result Value Ref Range   Glucose-Capillary 322 (H) 70 - 99 mg/dL  Surgical pcr screen     Status: None   Collection Time: 12/17/17 12:20 AM  Result Value Ref Range   MRSA, PCR NEGATIVE NEGATIVE   Staphylococcus aureus NEGATIVE NEGATIVE  CBC     Status: Abnormal   Collection Time: 12/17/17  2:57 AM  Result Value Ref Range   WBC 8.1 3.8 - 10.6 K/uL   RBC 3.21 (L) 4.40 - 5.90 MIL/uL   Hemoglobin 8.6 (L) 13.0 - 18.0 g/dL   HCT 26.0 (L) 40.0 - 52.0 %   MCV 80.8 80.0 - 100.0 fL   MCH 26.8 26.0 - 34.0 pg   MCHC 33.2 32.0 - 36.0 g/dL   RDW 16.7 (H) 11.5 - 14.5 %   Platelets 374 150 - 440 K/uL  Type and screen If not already done in ED     Status: None   Collection Time: 12/17/17  2:57 AM  Result Value Ref Range   ABO/RH(D) O POS    Antibody Screen NEG    Sample Expiration      12/20/2017 Performed at [facility], [address] [place], [place] [address]   Protime-INR     Status: Abnormal   Collection Time: 12/17/17  2:57 AM  Result Value Ref Range   Prothrombin Time 20.7 (H) 11.4 - 15.2 seconds   INR 1.80   APTT     Status: Abnormal   Collection Time: 12/17/17  2:57 AM  Result Value Ref  Range   aPTT 52 (H) 24 - 36 seconds  Basic metabolic panel     Status: Abnormal   Collection Time: 12/17/17  2:57 AM  Result Value Ref Range   Sodium 134 (L) 135 - 145 mmol/L   Potassium 5.1 3.5 - 5.1 mmol/L   Chloride 102 98 - 111 mmol/L   CO2 25 22 - 32 mmol/L   Glucose, Bld 380 (H) 70 - 99 mg/dL   BUN 42 (H) 6 - 20 mg/dL   Creatinine, Ser 3.12 (H) 0.61 - 1.24 mg/dL   Calcium 8.7 (L) 8.9 - 10.3 mg/dL   GFR calc non Af Amer 22 (L) >60 mL/min   GFR calc Af Amer 25 (L) >60 mL/min   Anion gap 7 5 - 15    Ct Knee Left Wo Contrast  Result Date: 12/16/2017 CLINICAL DATA:  Knee pain and swelling. Joint effusion with gas in the joint. Joint aspiration last week. EXAM: CT OF THE LEFT KNEE WITHOUT CONTRAST TECHNIQUE: Multidetector CT imaging of the left knee was performed according to the standard protocol. Multiplanar CT image reconstructions were also generated. COMPARISON:  Radiographs dated 12/16/2017 FINDINGS: Bones/Joint/Cartilage There are patchy areas of osteopenia in the distal femur and in the patella without discrete cortical destruction. There is a large joint effusion with gas in the joint. 6 cm Baker's cyst. Ligaments Suboptimally assessed by CT. Intact cruciate and collateral ligaments. Dystrophic calcification in the lateral patellofemoral ligament, probably due to prior remote injury. Muscles and Tendons Negative. Soft tissues Slight nonspecific subcutaneous edema and soft tissue calcifications anterior to the patella and patellar tendon. IMPRESSION: 1. Large joint effusion containing gas, worrisome for infection. 2. Patchy osteopenia of the distal femur and patella which could be due to disuse but the possibility of early osteomyelitis should be considered. Electronically Signed   By: Lorriane Shire M.D.   On: 12/16/2017 15:38   Chest Portable 1 View  Result Date: 12/17/2017 CLINICAL DATA:  Preop EXAM: PORTABLE CHEST 1 VIEW COMPARISON:  None. FINDINGS: Heart is top-normal in size.  Nonaneurysmal thoracic aorta. Right atrial and ventricular pacer leads are noted as well as a right ventricular ICD lead. Lungs are clear. Remote left-sided fourth through seventh posterior rib fractures are present. IMPRESSION: No active disease. Electronically Signed   By: Ashley Royalty M.D.   On: 12/17/2017 00:44   Dg Knee Complete 4 Views Left  Result Date: 12/16/2017 CLINICAL DATA:  LEFT knee pain for a month after fall. Sharp stabbing patella pain. EXAM: LEFT KNEE - COMPLETE 4+ VIEW COMPARISON:  None. FINDINGS: No evidence of fracture, dislocation, or joint effusion. Mild patella spurring. No evidence of arthropathy or other focal bone abnormality. Suprapatellar soft tissue swelling with large effusion and subcutaneous gas. No radiopaque foreign bodies. IMPRESSION: 1. Soft tissue swelling and large suprapatellar joint effusion with subcutaneous gas, gas-forming organism/septic arthropathy not excluded. Recommend correlation with procedural history. 2. No  acute osseous process or advanced degenerative change. 3. Acute findings discussed with and reconfirmed by Dr.PAUL MALINDA on 12/16/2017 at 2:39 pm. Electronically Signed   By: Elon Alas M.D.   On: 12/16/2017 14:40    Assessment/Plan: Day of Surgery   Active Problems:   Infection of left knee ([name])  Patient's aspirate is showing monosodium urate crystals.  I shared these findings with the patient.  Patient does admit to a history of gout.  Despite findings consistent with a gout flare, the intra-articular air bubbles seen on the patient's plain films and CT scan are still concerning for possible infection.  I am recommending that the patient undergo an arthroscopic washout of his left knee today to treat a possible infection.  Additional cultures will be sent from the OR.  He will remain on IV antibiotics.  I am consulting rheumatology for treatment for his gout.  I am also going to consult infectious disease for recommendations on antibiotic  treatment.  Patient will remain n.p.o. in preparation for surgery today.  Patient is in agreement with the above-noted plan.     Thornton Park , MD 12/17/2017, 8:34 AM

## 2017-12-17 NOTE — Progress Notes (Signed)
Spoke with Dr. Jannifer Franklin about patient's order for [place]. Informed Dr. Jannifer Franklin that patient was due for surgery tomorrow. Dr. Jannifer Franklin stated that patient still needed to take med even with surgery scheduled.

## 2017-12-17 NOTE — Transfer of Care (Signed)
Immediate Anesthesia Transfer of Care Note  Patient: Edwin [name]  Procedure(s) Performed: LEFT KNEE DEBRIDEMENT AND SYNOVECTOMY (Left Knee)  Patient Location: PACU  Anesthesia Type:General  Level of Consciousness: drowsy and patient cooperative  Airway & Oxygen Therapy: Patient Spontanous Breathing and Patient connected to face mask oxygen  Post-op Assessment: Report given to RN and Post -op Vital signs reviewed and stable  Post vital signs: Reviewed and stable  Last Vitals:  Vitals Value Taken Time  BP 135/67 12/17/2017  1:57 PM  Temp    Pulse 74 12/17/2017  2:00 PM  Resp 15 12/17/2017  2:00 PM  SpO2 93 % 12/17/2017  2:00 PM  Vitals shown include unvalidated device data.  Last Pain:  Vitals:   12/17/17 1100  TempSrc: Temporal  PainSc: 0-No pain      Patients Stated Pain Goal: 0 (77/37/50 5107)  Complications: No apparent anesthesia complications

## 2017-12-17 NOTE — Progress Notes (Addendum)
Inpatient Diabetes Program Recommendations  AACE/ADA: New Consensus Statement on Inpatient Glycemic Control (2019)  Target Ranges:  Prepandial:   less than 140 mg/dL      Peak postprandial:   less than 180 mg/dL (1-2 hours)      Critically ill patients:  140 - 180 mg/dL  Results for JAMAURIE, BERNIER (MRN [id number]) as of 12/17/2017 07:59  Ref. Range 12/17/2017 02:57  Glucose Latest Ref Range: 70 - 99 mg/dL 380 (H)   Results for JAKIE, DEBOW (MRN [id number]) as of 12/17/2017 07:59  Ref. Range 12/16/2017 23:17  Glucose-Capillary Latest Ref Range: 70 - 99 mg/dL 322 (H)   Review of Glycemic Control  Diabetes history:DM2 Outpatient Diabetes medications: NPH 14 units BID Current orders for Inpatient glycemic control: Levemir 10 units QHS, Novolog 0-9 units TID with meals, Novolog 0-5 units QHS  Inpatient Diabetes Program Recommendations:  Insulin - Basal: Please consider changing Levemir to 15 units BID (starting now). Correction (SSI): Please consider increasing Novolog correction to moderate scale Novolog 0-15 units TID with meals. Insulin - Meal Coverage: Once diet is resumed, please consider ordering Novolog 4 units TID with meals for meal coverage. HgbA1C: Per Care Everywhere, A1C was 11.2% on 12/04/17 indicating an average glucose of 275 mg/dl.  Addendum 12/17/17@14 :15-Came by patient's room twice today but patient is off floor in OR at this time. Will follow up and try to see patient again tomorrow.  Thanks, Barnie Alderman, RN, MSN, CDE Diabetes Coordinator Inpatient Diabetes Program [phone] (Team Pager from 8am to 5pm)

## 2017-12-17 NOTE — Clinical Social Work Note (Signed)
Clinical Social Work Assessment  Patient Details  Name: Edwin [name] MRN: [id number] Date of Birth: Oct 18, 1970  Date of referral:  12/17/17               Reason for consult:  Discharge Planning                Permission sought to share information with:  Case Manager Permission granted to share information::  Yes, Verbal Permission Granted  Name::        Agency::     Relationship::     Contact Information:     Housing/Transportation Living arrangements for the past 2 months:  Single Family Home Source of Information:  Patient, Spouse Patient Interpreter Needed:  None Criminal Activity/Legal Involvement Pertinent to Current Situation/Hospitalization:  No - Comment as needed Significant Relationships:  Adult Children, Spouse Lives with:  Spouse Do you feel safe going back to the place where you live?  Yes Need for family participation in patient care:  Yes (Comment)  Care giving concerns:  Patient lives in [place] with his wife [name].    Social Worker assessment / plan:  Holiday representative ([employer]) reviewed chart and noted that patient is having surgery today. CSW met with patient and his wife [name] was at bedside. Patient was alert and oriented X4 and was laying in the bed. CSW introduced self and explained role of CSW department. Per patient he lives in a 2 story home in [place] with his wife. Per patient his wife and daughters are very supportive and are moving him to the lower level so he does not have to walk up the stairs. Per patient he is on disability and receives SSI. CSW explained that PT will evaluate patient after surgery and make a recommendation of SNF or home health. Per patient he has been to [facility] before and stayed only 12 hours due to a bed experience. Per patient he does not want to go to SNF. Patient reported that he is already open to home health PT and nursing at home and prefers to continue that at home. Per patient he does not know the  name of the home health agency however he knows they are out of [facility]. Per patient the home health agency is also working on getting a hospital bed for him. Per patient he will D/C home and has no other needs. RN case manager aware of above. CSW will continue to follow and assist as needed.   Employment status:  Disabled (Comment on whether or not currently receiving Disability) Insurance information:  Medicare PT Recommendations:  Not assessed at this time Information / Referral to community resources:  Other (Comment Required)(Patient prefers to return home and continue home health services. )  Patient/Family's Response to care:  Patient prefers to D/C home.   Patient/Family's Understanding of and Emotional Response to Diagnosis, Current Treatment, and Prognosis:  Patient and his wife are very pleasant and thanked CSW for assistance.   Emotional Assessment Appearance:  Appears stated age Attitude/Demeanor/Rapport:    Affect (typically observed):  Accepting, Adaptable, Pleasant Orientation:  Oriented to Self, Oriented to Place, Oriented to  Time, Oriented to Situation Alcohol / Substance use:  Not Applicable Psych involvement (Current and /or in the community):  No (Comment)  Discharge Needs  Concerns to be addressed:  Discharge Planning Concerns Readmission within the last 30 days:  No Current discharge risk:  Chronically ill Barriers to Discharge:  Continued Medical Work up   [employer], Veronia Beets,  LCSW 12/17/2017, 10:03 AM

## 2017-12-17 NOTE — Anesthesia Post-op Follow-up Note (Signed)
Anesthesia QCDR form completed.        

## 2017-12-17 NOTE — NC FL2 (Signed)
[place] LEVEL OF CARE SCREENING TOOL     IDENTIFICATION  [name] Birthdate: May 13, 1970 Sex: male Admission Date (Current Location): 12/16/2017  [place] and [place] Number:  Engineering geologist and Address:  [facility], [address], [place], [place] [address]      Provider Number: [id number]  Attending Physician Name and Address:  Thornton Park, MD  Relative Name and Phone Number:       Current Level of Care: Hospital Recommended Level of Care: [place] Prior Approval Number:    Date Approved/Denied:   PASRR Number: [id number])  Discharge Plan: SNF    Current Diagnoses: Patient Active Problem List   Diagnosis Date Noted  . Infection of left knee ([place]) 12/16/2017    Orientation RESPIRATION BLADDER Height & Weight     Self, Time, Situation, Place  Normal Continent Weight: (!) 325 lb (147.4 kg) Height:  6\' 2"  (188 cm)  BEHAVIORAL SYMPTOMS/MOOD NEUROLOGICAL BOWEL NUTRITION STATUS      Continent Diet(Diet: NPO for surgery to be advanced. )  AMBULATORY STATUS COMMUNICATION OF NEEDS Skin   Extensive Assist Verbally Normal                       Personal Care Assistance Level of Assistance  Bathing, Feeding, Dressing Bathing Assistance: Limited assistance Feeding assistance: Independent Dressing Assistance: Limited assistance     Functional Limitations Info  Sight, Hearing, Speech Sight Info: Adequate Hearing Info: Adequate Speech Info: Adequate    SPECIAL CARE FACTORS FREQUENCY  PT (By licensed PT), OT (By licensed OT)     PT Frequency: (5) OT Frequency: (5)            Contractures      Additional Factors Info  Code Status, Allergies Code Status Info: (Full Code. ) Allergies Info: (Morphine And Related)           Current Medications (12/17/2017):  This is the current hospital active medication list Current Facility-Administered Medications  Medication  Dose Route Frequency Provider Last Rate Last Dose  . 0.9 %  sodium chloride infusion   Intravenous Continuous Thornton Park, MD   Stopped at 12/17/17 0024  . amiodarone (PACERONE) tablet 200 mg  200 mg Oral Daily Sainani, Vivek J, MD      . amLODipine (NORVASC) tablet 10 mg  10 mg Oral Daily Sainani, Belia Heman, MD      . atorvastatin (LIPITOR) tablet 80 mg  80 mg Oral QPM Henreitta Leber, MD   80 mg at 12/17/17 0005  . bumetanide (BUMEX) tablet 2 mg  2 mg Oral Daily Sainani, Belia Heman, MD      . carvedilol (COREG) tablet 25 mg  25 mg Oral Q breakfast Henreitta Leber, MD   25 mg at 12/17/17 0901  . carvedilol (COREG) tablet 50 mg  50 mg Oral Q supper Thornton Park, MD   50 mg at 12/17/17 0004  . ceFEPIme (MAXIPIME) 1 g in sodium chloride 0.9 % 100 mL IVPB  1 g Intravenous Once Nena Polio, MD      . cholecalciferol (VITAMIN D) tablet 5,000 Units  5,000 Units Oral Daily Henreitta Leber, MD      . colchicine tablet 0.6 mg  0.6 mg Oral BID Thornton Park, MD      . ezetimibe (ZETIA) tablet 10 mg  10 mg Oral Daily Henreitta Leber, MD      . famotidine (  PEPCID) tablet 20 mg  20 mg Oral Daily Sainani, Belia Heman, MD      . FLUoxetine (PROZAC) capsule 60 mg  60 mg Oral Daily Henreitta Leber, MD      . HYDROcodone-acetaminophen (NORCO/VICODIN) 5-325 MG per tablet 1-2 tablet  1-2 tablet Oral Q6H PRN Thornton Park, MD   2 tablet at 12/17/17 0228  . insulin aspart (novoLOG) injection 0-5 Units  0-5 Units Subcutaneous QHS Henreitta Leber, MD   4 Units at 12/17/17 0007  . insulin aspart (novoLOG) injection 0-9 Units  0-9 Units Subcutaneous TID WC Henreitta Leber, MD   3 Units at 12/17/17 0906  . insulin detemir (LEVEMIR) injection 10 Units  10 Units Subcutaneous QHS Henreitta Leber, MD   10 Units at 12/17/17 0007  . patiromer Daryll Drown) packet 16.8 g  16.8 g Oral Daily Henreitta Leber, MD      . phytonadione (VITAMIN K) 10 mg in dextrose 5 % 50 mL IVPB  10 mg Intravenous Once Thornton Park,  MD      . phytonadione (VITAMIN K) 10 mg in dextrose 5 % 50 mL IVPB  10 mg Intravenous Once Thornton Park, MD 50 mL/hr at 12/17/17 0912 10 mg at 12/17/17 0912  . piperacillin-tazobactam (ZOSYN) 4.5 g in sodium chloride 0.9 % 100 mL IVPB   Intravenous Q8H Thornton Park, MD      . sevelamer carbonate (RENVELA) tablet 1,600 mg  1,600 mg Oral TID WC Sainani, Belia Heman, MD      . ticagrelor (BRILINTA) tablet 90 mg  90 mg Oral BID Henreitta Leber, MD   90 mg at 12/17/17 0909  . vancomycin (VANCOCIN) 2,000 mg in sodium chloride 0.9 % 500 mL IVPB  2,000 mg Intravenous Q24H Shari Prows, RPH      . vancomycin variable dose per unstable renal function (pharmacist dosing)   Does not apply See admin instructions Nena Polio, MD         Discharge Medications: Please see discharge summary for a list of discharge medications.  Relevant Imaging Results:  Relevant Lab Results:   Additional Information (SSN: [id number])  Mellanie Bejarano, Veronia Beets, LCSW

## 2017-12-18 ENCOUNTER — Inpatient Hospital Stay: Payer: Medicare Other

## 2017-12-18 ENCOUNTER — Encounter: Payer: Self-pay | Admitting: Orthopedic Surgery

## 2017-12-18 LAB — BASIC METABOLIC PANEL
ANION GAP: 10 (ref 5–15)
BUN: 54 mg/dL — ABNORMAL HIGH (ref 6–20)
CALCIUM: 8.3 mg/dL — AB (ref 8.9–10.3)
CO2: 22 mmol/L (ref 22–32)
Chloride: 100 mmol/L (ref 98–111)
Creatinine, Ser: 3.57 mg/dL — ABNORMAL HIGH (ref 0.61–1.24)
GFR, EST AFRICAN AMERICAN: 22 mL/min — AB (ref >60)
GFR, EST NON AFRICAN AMERICAN: 19 mL/min — AB (ref >60)
GLUCOSE: 410 mg/dL — AB (ref 70–99)
Potassium: 5.9 mmol/L — ABNORMAL HIGH (ref 3.5–5.1)
SODIUM: 132 mmol/L — AB (ref 135–145)

## 2017-12-18 LAB — HEMOGLOBIN A1C
Hgb A1c MFr Bld: 12 % — ABNORMAL HIGH (ref 4.8–5.6)
Mean Plasma Glucose: 298 mg/dL

## 2017-12-18 LAB — CBC
HCT: 26.5 % — ABNORMAL LOW (ref 40.0–52.0)
Hemoglobin: 8.8 g/dL — ABNORMAL LOW (ref 13.0–18.0)
MCH: 27.3 pg (ref 26.0–34.0)
MCHC: 33.3 g/dL (ref 32.0–36.0)
MCV: 81.9 fL (ref 80.0–100.0)
PLATELETS: 398 10*3/uL (ref 150–440)
RBC: 3.23 MIL/uL — AB (ref 4.40–5.90)
RDW: 16.4 % — ABNORMAL HIGH (ref 11.5–14.5)
WBC: 11.8 10*3/uL — AB (ref 3.8–10.6)

## 2017-12-18 LAB — GLUCOSE, CAPILLARY
GLUCOSE-CAPILLARY: 494 mg/dL — AB (ref 70–99)
Glucose-Capillary: 406 mg/dL — ABNORMAL HIGH (ref 70–99)
Glucose-Capillary: 374 mg/dL — ABNORMAL HIGH (ref 70–99)
Glucose-Capillary: 403 mg/dL — ABNORMAL HIGH (ref 70–99)
Glucose-Capillary: 407 mg/dL — ABNORMAL HIGH (ref 70–99)
Glucose-Capillary: 336 mg/dL — ABNORMAL HIGH (ref 70–99)
Glucose-Capillary: 205 mg/dL — ABNORMAL HIGH (ref 70–99)
Glucose-Capillary: 184 mg/dL — ABNORMAL HIGH (ref 70–99)
Glucose-Capillary: 202 mg/dL — ABNORMAL HIGH (ref 70–99)

## 2017-12-18 LAB — POTASSIUM: Potassium: 5.1 mmol/L (ref 3.5–5.1)

## 2017-12-18 LAB — PROTIME-INR
INR: 1.21
PROTHROMBIN TIME: 15.2 s (ref 11.4–15.2)

## 2017-12-18 LAB — PHOSPHORUS: PHOSPHORUS: 4 mg/dL (ref 2.5–4.6)

## 2017-12-18 LAB — URIC ACID: URIC ACID, SERUM: 9 mg/dL — AB (ref 3.7–8.6)

## 2017-12-18 LAB — HIV ANTIBODY (ROUTINE TESTING W REFLEX): HIV Screen 4th Generation wRfx: NONREACTIVE

## 2017-12-18 MED ORDER — INSULIN DETEMIR 100 UNIT/ML ~~LOC~~ SOLN
30.0000 [IU] | Freq: Two times a day (BID) | SUBCUTANEOUS | Status: DC
  Administered 2017-12-18: 30 [IU] via SUBCUTANEOUS
  Filled 2017-12-18 (×2): qty 0.3

## 2017-12-18 MED ORDER — PREDNISONE 10 MG (21) PO TBPK
20.0000 mg | ORAL_TABLET | Freq: Every evening | ORAL | Status: AC
  Administered 2017-12-18: 20 mg via ORAL

## 2017-12-18 MED ORDER — INSULIN ASPART 100 UNIT/ML IV SOLN
15.0000 [IU] | Freq: Once | INTRAVENOUS | Status: AC
  Administered 2017-12-18: 15 [IU] via INTRAVENOUS
  Filled 2017-12-18 (×2): qty 0.15

## 2017-12-18 MED ORDER — PREDNISONE 10 MG (21) PO TBPK: 10.0000 mg | ORAL_TABLET | Freq: Three times a day (TID) | ORAL | Status: DC

## 2017-12-18 MED ORDER — INSULIN REGULAR HUMAN 100 UNIT/ML IJ SOLN
20.0000 [IU] | Freq: Once | INTRAMUSCULAR | Status: AC
  Administered 2017-12-18: 20 [IU] via INTRAVENOUS
  Filled 2017-12-18: qty 10

## 2017-12-18 MED ORDER — INSULIN ASPART 100 UNIT/ML ~~LOC~~ SOLN
10.0000 [IU] | Freq: Three times a day (TID) | SUBCUTANEOUS | Status: DC
  Administered 2017-12-18: 10 [IU] via SUBCUTANEOUS
  Filled 2017-12-18: qty 1

## 2017-12-18 MED ORDER — GABAPENTIN 100 MG PO CAPS
100.0000 mg | ORAL_CAPSULE | Freq: Three times a day (TID) | ORAL | Status: DC
  Administered 2017-12-18 – 2017-12-20 (×7): 100 mg via ORAL
  Filled 2017-12-18 (×7): qty 1

## 2017-12-18 MED ORDER — SODIUM CHLORIDE 0.9 % IV SOLN
1.0000 g | INTRAVENOUS | Status: DC
  Administered 2017-12-18: 1 g via INTRAVENOUS
  Filled 2017-12-18 (×2): qty 1

## 2017-12-18 MED ORDER — SODIUM POLYSTYRENE SULFONATE 15 GM/60ML PO SUSP
30.0000 g | Freq: Once | ORAL | Status: AC
  Administered 2017-12-18: 30 g via ORAL
  Filled 2017-12-18: qty 120

## 2017-12-18 MED ORDER — INSULIN ASPART 100 UNIT/ML IV SOLN
10.0000 [IU] | Freq: Once | INTRAVENOUS | Status: AC
  Administered 2017-12-18: 10 [IU] via INTRAVENOUS
  Filled 2017-12-18: qty 0.1

## 2017-12-18 MED ORDER — INSULIN ASPART 100 UNIT/ML ~~LOC~~ SOLN
15.0000 [IU] | Freq: Three times a day (TID) | SUBCUTANEOUS | Status: DC
  Administered 2017-12-18 – 2017-12-19 (×2): 15 [IU] via SUBCUTANEOUS
  Filled 2017-12-18 (×2): qty 1

## 2017-12-18 MED ORDER — INSULIN ASPART 100 UNIT/ML ~~LOC~~ SOLN
30.0000 [IU] | Freq: Once | SUBCUTANEOUS | Status: AC
  Administered 2017-12-18: 30 [IU] via SUBCUTANEOUS
  Filled 2017-12-18: qty 1

## 2017-12-18 MED ORDER — METRONIDAZOLE IN NACL 5-0.79 MG/ML-% IV SOLN
500.0000 mg | Freq: Three times a day (TID) | INTRAVENOUS | Status: DC
  Administered 2017-12-18 – 2017-12-19 (×3): 500 mg via INTRAVENOUS
  Filled 2017-12-18 (×5): qty 100

## 2017-12-18 MED ORDER — INSULIN DETEMIR 100 UNIT/ML ~~LOC~~ SOLN
40.0000 [IU] | Freq: Two times a day (BID) | SUBCUTANEOUS | Status: DC
  Administered 2017-12-18 – 2017-12-19 (×2): 40 [IU] via SUBCUTANEOUS
  Filled 2017-12-18 (×3): qty 0.4

## 2017-12-18 MED ORDER — PREDNISONE 10 MG (21) PO TBPK: 20.0000 mg | ORAL_TABLET | Freq: Every evening | ORAL | Status: DC

## 2017-12-18 MED ORDER — PREDNISONE 10 MG (21) PO TBPK
20.0000 mg | ORAL_TABLET | Freq: Every morning | ORAL | Status: AC
  Administered 2017-12-18: 20 mg via ORAL
  Filled 2017-12-18: qty 21

## 2017-12-18 MED ORDER — PREDNISONE 10 MG (21) PO TBPK
10.0000 mg | ORAL_TABLET | ORAL | Status: AC
  Administered 2017-12-18: 10 mg via ORAL

## 2017-12-18 MED ORDER — PREDNISONE 10 MG (21) PO TBPK: 10.0000 mg | ORAL_TABLET | Freq: Four times a day (QID) | ORAL | Status: DC

## 2017-12-18 MED FILL — Sodium Chloride IV Soln 0.9%: INTRAVENOUS | Qty: 100 | Status: AC

## 2017-12-18 MED FILL — Piperacillin Sod-Tazobactam Sod For Inj 2.25 GM (2-0.25 GM): INTRAVENOUS | Qty: 4.5 | Status: AC

## 2017-12-18 MED FILL — Dextrose Inj 5%: INTRAVENOUS | Qty: 100 | Status: CN

## 2017-12-18 NOTE — Anesthesia Postprocedure Evaluation (Signed)
Anesthesia Post Note  Patient: Edwin [name]  Procedure(s) Performed: LEFT KNEE DEBRIDEMENT AND SYNOVECTOMY (Left Knee)  Patient location during evaluation: PACU Anesthesia Type: General Level of consciousness: awake and alert Pain management: pain level controlled Vital Signs Assessment: post-procedure vital signs reviewed and stable Respiratory status: spontaneous breathing, nonlabored ventilation, respiratory function stable and patient connected to nasal cannula oxygen Cardiovascular status: blood pressure returned to baseline and stable Postop Assessment: no apparent nausea or vomiting Anesthetic complications: no     Last Vitals:  Vitals:   12/18/17 0406 12/18/17 0802  BP: 118/62 123/65  Pulse: 77 75  Resp: 18   Temp: 37 C 36.8 C  SpO2: 96% 98%    Last Pain:  Vitals:   12/18/17 0943  TempSrc:   PainSc: 4                  Martha Clan

## 2017-12-18 NOTE — Progress Notes (Addendum)
Inpatient Diabetes Program Recommendations  AACE/ADA: New Consensus Statement on Inpatient Glycemic Control (2019)  Target Ranges:  Prepandial:   less than 140 mg/dL      Peak postprandial:   less than 180 mg/dL (1-2 hours)      Critically ill patients:  140 - 180 mg/dL   Results for AFNAN, EMBERTON (MRN [id number]) as of 12/18/2017 07:47  Ref. Range 12/17/2017 08:35 12/17/2017 10:33 12/17/2017 14:18 12/17/2017 16:45 12/17/2017 21:00 12/17/2017 23:34 12/18/2017 00:54 12/18/2017 04:04  Glucose-Capillary Latest Ref Range: 70 - 99 mg/dL 230 (H)  Novolog 3 units 233 (H)    Decadron 4 mg @11 :55 218 (H) 308 (H)  Novolog 15 units 488 (H)  Regular 10 units  Levemir 20 units 518 (HH)  Regular 20 units 494 (H)  Regular 20 units 406 (H)   Review of Glycemic Control  Diabetes history:DM2 Outpatient Diabetes medications: NPH 14 units BID Current orders for Inpatient glycemic control: Levemir 20 units QHS, Novolog 0-20 units AC&HS  Inpatient Diabetes Program Recommendations:  Insulin - Basal: Please consider increasing Levemir to 20 units BID (starting now). Insulin - Meal Coverage: Please consider ordering Novolog 6 units TID with meals for meal coverage. HgbA1C: A1C 12% on 12/17/17 indicating an average glucose of 298 mg/dl over the past 2-3 months. Patient will need outpatient DM medications adjustments to improve DM control and follow up with PCP or Endocrinologist regarding DM control.  NOTE: Patient received Decadron 4 mg at 11:55 on 12/17/17 which is contributing to hyperglycemia. Noted glucose up to 518 mg/dl at 23:34 on 12/17/17.  Thanks, Barnie Alderman, RN, MSN, CDE Diabetes Coordinator Inpatient Diabetes Program [phone] (Team Pager from 8am to 5pm)

## 2017-12-18 NOTE — Progress Notes (Addendum)
Subjective:  POD #1 s/p left knee arthroscopic debridement.  Patient reports left knee pain as moderate.  I spoke with infectious disease today.  It is felt that the patient's knee pain and swelling is due to gout.  Appreciate Dr. Scharlene Gloss recommendation for treatment of his gout in the setting of his kidney failure.  Patient is being evaluated by the diabetic during my visit.  Cultures from the OR are negative so far.   Dr.  Delaine Lame from ID has recommended continuing vancomycin and cefipime overnight until 48 hours of confirmed negative cultures.  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  Patient has diminished sensation in the left foot as well.  His dressing is clean dry and intact. Intact pulses distally No cellulitis present Compartment soft  LABS  Results for orders placed or performed during the hospital encounter of 12/16/17 (from the past 24 hour(s))  Glucose, capillary     Status: Abnormal   Collection Time: 12/17/17  2:18 PM  Result Value Ref Range   Glucose-Capillary 218 (H) 70 - 99 mg/dL  Glucose, capillary     Status: Abnormal   Collection Time: 12/17/17  4:45 PM  Result Value Ref Range   Glucose-Capillary 308 (H) 70 - 99 mg/dL  Glucose, capillary     Status: Abnormal   Collection Time: 12/17/17  9:00 PM  Result Value Ref Range   Glucose-Capillary 488 (H) 70 - 99 mg/dL  Glucose, capillary     Status: Abnormal   Collection Time: 12/17/17 11:34 PM  Result Value Ref Range   Glucose-Capillary 518 (HH) 70 - 99 mg/dL   Comment 1 Notify RN   Glucose, capillary      Status: Abnormal   Collection Time: 12/18/17 12:54 AM  Result Value Ref Range   Glucose-Capillary 494 (H) 70 - 99 mg/dL  Glucose, capillary     Status: Abnormal   Collection Time: 12/18/17  4:04 AM  Result Value Ref Range   Glucose-Capillary 406 (H) 70 - 99 mg/dL  Uric acid     Status: Abnormal   Collection Time: 12/18/17  5:21 AM  Result Value Ref Range   Uric Acid, Serum 9.0 (H) 3.7 - 8.6 mg/dL  CBC     Status: Abnormal   Collection Time: 12/18/17  5:21 AM  Result Value Ref Range   WBC 11.8 (H) 3.8 - 10.6 K/uL   RBC 3.23 (L) 4.40 - 5.90 MIL/uL   Hemoglobin 8.8 (L) 13.0 - 18.0 g/dL   HCT 26.5 (L) 40.0 - 52.0 %   MCV 81.9 80.0 - 100.0 fL   MCH 27.3 26.0 - 34.0 pg   MCHC 33.3 32.0 - 36.0 g/dL   RDW 16.4 (H) 11.5 - 14.5 %   Platelets 398 150 - 440 K/uL  Basic metabolic panel     Status: Abnormal   Collection Time: 12/18/17  5:21 AM  Result Value Ref Range   Sodium 132 (L) 135 - 145 mmol/L  Potassium 5.9 (H) 3.5 - 5.1 mmol/L   Chloride 100 98 - 111 mmol/L   CO2 22 22 - 32 mmol/L   Glucose, Bld 410 (H) 70 - 99 mg/dL   BUN 54 (H) 6 - 20 mg/dL   Creatinine, Ser 3.57 (H) 0.61 - 1.24 mg/dL   Calcium 8.3 (L) 8.9 - 10.3 mg/dL   GFR calc non Af Amer 19 (L) >60 mL/min   GFR calc Af Amer 22 (L) >60 mL/min   Anion gap 10 5 - 15  Protime-INR     Status: None   Collection Time: 12/18/17  5:21 AM  Result Value Ref Range   Prothrombin Time 15.2 11.4 - 15.2 seconds   INR 1.21   Glucose, capillary     Status: Abnormal   Collection Time: 12/18/17  8:00 AM  Result Value Ref Range   Glucose-Capillary 374 (H) 70 - 99 mg/dL   Comment 1 Notify RN   Glucose, capillary     Status: Abnormal   Collection Time: 12/18/17 11:08 AM  Result Value Ref Range   Glucose-Capillary 403 (H) 70 - 99 mg/dL  Glucose, capillary     Status: Abnormal   Collection Time: 12/18/17 12:06 PM  Result Value Ref Range   Glucose-Capillary 407 (H) 70 - 99 mg/dL   Comment 1 Notify RN   Potassium     Status: None    Collection Time: 12/18/17 12:34 PM  Result Value Ref Range   Potassium 5.1 3.5 - 5.1 mmol/L    Ct Knee Left Wo Contrast  Result Date: 12/16/2017 CLINICAL DATA:  Knee pain and swelling. Joint effusion with gas in the joint. Joint aspiration last week. EXAM: CT OF THE LEFT KNEE WITHOUT CONTRAST TECHNIQUE: Multidetector CT imaging of the left knee was performed according to the standard protocol. Multiplanar CT image reconstructions were also generated. COMPARISON:  Radiographs dated 12/16/2017 FINDINGS: Bones/Joint/Cartilage There are patchy areas of osteopenia in the distal femur and in the patella without discrete cortical destruction. There is a large joint effusion with gas in the joint. 6 cm Baker's cyst. Ligaments Suboptimally assessed by CT. Intact cruciate and collateral ligaments. Dystrophic calcification in the lateral patellofemoral ligament, probably due to prior remote injury. Muscles and Tendons Negative. Soft tissues Slight nonspecific subcutaneous edema and soft tissue calcifications anterior to the patella and patellar tendon. IMPRESSION: 1. Large joint effusion containing gas, worrisome for infection. 2. Patchy osteopenia of the distal femur and patella which could be due to disuse but the possibility of early osteomyelitis should be considered. Electronically Signed   By: Lorriane Shire M.D.   On: 12/16/2017 15:38   US Renal  Result Date: 12/18/2017 CLINICAL DATA:  Chronic kidney disease. EXAM: RENAL / URINARY TRACT ULTRASOUND COMPLETE COMPARISON:  None. FINDINGS: Right Kidney: Length: 15.5 cm. Echogenicity within normal limits. No mass or hydronephrosis visualized. Left Kidney: Length: 13.3 cm. 1.8 cm partially exophytic simple cyst is seen in midpole. Echogenicity within normal limits. No mass or hydronephrosis visualized. Bladder: Appears normal for degree of bladder distention. IMPRESSION: No significant renal abnormality is noted. Electronically Signed   By: Marijo Conception, M.D.    On: 12/18/2017 09:20   Chest Portable 1 View  Result Date: 12/17/2017 CLINICAL DATA:  Preop EXAM: PORTABLE CHEST 1 VIEW COMPARISON:  None. FINDINGS: Heart is top-normal in size. Nonaneurysmal thoracic aorta. Right atrial and ventricular pacer leads are noted as well as a right ventricular ICD lead. Lungs are clear. Remote left-sided fourth through seventh  posterior rib fractures are present. IMPRESSION: No active disease. Electronically Signed   By: Ashley Royalty M.D.   On: 12/17/2017 00:44   Dg Knee Complete 4 Views Left  Result Date: 12/16/2017 CLINICAL DATA:  LEFT knee pain for a month after fall. Sharp stabbing patella pain. EXAM: LEFT KNEE - COMPLETE 4+ VIEW COMPARISON:  None. FINDINGS: No evidence of fracture, dislocation, or joint effusion. Mild patella spurring. No evidence of arthropathy or other focal bone abnormality. Suprapatellar soft tissue swelling with large effusion and subcutaneous gas. No radiopaque foreign bodies. IMPRESSION: 1. Soft tissue swelling and large suprapatellar joint effusion with subcutaneous gas, gas-forming organism/septic arthropathy not excluded. Recommend correlation with procedural history. 2. No acute osseous process or advanced degenerative change. 3. Acute findings discussed with and reconfirmed by Dr.PAUL MALINDA on 12/16/2017 at 2:39 pm. Electronically Signed   By: Elon Alas M.D.   On: 12/16/2017 14:40    Assessment/Plan: 1 Day Post-Op   Active Problems:   Gout of left knee  Patient is doing well from an orthopedic standpoint.  He Dragos weight-bear as tolerated on the left lower extremity.  I recommend physical therapy evaluation for gait training and safety evaluation.  Patient will likely need a walker for assistance with ambulation in the early postop period.  Patient will restart his Coumadin.  Patient has a blood sugar above 400 today, his creatinine is above 3.5, his potassium is 5.9.  Physical therapy has not worked with the patient due to his  hyperkalemia.  I have spoken with Dr. Jerelyn Charles and will transfer the patient to the medical service at this time given that the patient does not have a septic knee joint.  Dr. Jerelyn Charles asked about a corticosteroid injection to the left knee but I would like to wait until final cultures are negative.  He can be discharged from the hospital and follow-up in my office next week for reevaluation and possible corticosteroid injection.  Patient needs to follow-up with Dr. Jefm Bryant in rheumatology at [facility] clinic for long-term management of the patient's gout.    Thornton Park , MD 12/18/2017, 1:35 PM

## 2017-12-18 NOTE — Progress Notes (Signed)
CBG rechecked at 0400, yielded 406. Pt received a total of 50 units regular (Novolin R) insulin. Dr. Marcille Blanco paged and made aware, no further orders made but Diabetes coordinator consult.

## 2017-12-18 NOTE — Progress Notes (Signed)
Pharmacy - Brief Note  Cefepime per pharmacy per ID (stop zosyn) pending culture results.    Renal fx: Scr trending up.  suspect CrCl (Cockgroft-gault) is over predicting actual renal fx due to obesity.  Normalized CrCl and MDRD are < 68ml/min  Plan: 1. Cefepime 1gm IV q24h per current renal function 2.  F/u ability to stop antibiotics (awaiting results of cultures)  Doreene Eland, PharmD, BCPS.   Work Cell: [phone] 12/18/2017 11:53 AM

## 2017-12-18 NOTE — Evaluation (Signed)
Physical Therapy Evaluation Patient Details Name: Edwin [name] MRN: [id number] DOB: June 06, 1970 Today's Date: 12/18/2017   History of Present Illness  Pt is a 47 y.o. male presenting to hospital 12/16/17 with L knee pain x1 month s/p mechanical fall going up stairs (daughter's dog knocked him down); s/p L knee aspiration 12/16/17.  CT scan showing large joint effusion containing gas.  Pt admitted with hyperkalemia, CKD stage IV, paroxysmal a-fib, and found to have gout.  Pt s/p L knee arthroscopic debridement and synovectomy, chondroplasty of medial femoral condyle 9/18.  PMH includes L drop foot and decreased sensation L foot, h/o MI, s/p stent placement Dec 2018, DM, htn, cardiac surgery, joint replacement, CKD, pacemaker  Clinical Impression  Prior to hospital admission, pt was modified independent ambulating with SPC and L LE brace (d/t h/o L foot drop).  Pt lives with his wife and plans to stay on main floor upon hospital discharge; 3 steps to enter no railing.  Currently pt is modified independent semi-supine to sit; CGA with transfers (from elevated bed to simulate home bed height), and CGA ambulating a few feet with RW.  Pt requesting not to walk further than to chair d/t not having L LE brace here to wear (pt called his family to bring in L LE brace for next session).  Pt would benefit from skilled PT to address noted impairments and functional limitations (see below for any additional details).  Upon hospital discharge, recommend pt discharge to home with HHPT.    Follow Up Recommendations Home health PT;Supervision for mobility/OOB    Equipment Recommendations  Rolling walker with 5" wheels    Recommendations for Other Services       Precautions / Restrictions Precautions Precautions: Fall Restrictions Weight Bearing Restrictions: Yes LLE Weight Bearing: Weight bearing as tolerated      Mobility  Bed Mobility Overal bed mobility: Modified Independent             General  bed mobility comments: Semi-supine to sit with mild increased effort  Transfers Overall transfer level: Needs assistance Equipment used: Rolling walker (2 wheeled) Transfers: Sit to/from Stand Sit to Stand: Min guard;From elevated surface         General transfer comment: pt requesting bed height elevated closer to bed height at home; increased effort and time to stand on own with RW  Ambulation/Gait Ambulation/Gait assistance: Min guard Gait Distance (Feet): 3 Feet(bed to recliner) Assistive device: Rolling walker (2 wheeled)   Gait velocity: decreased   General Gait Details: antalgic; decreased stance time L LE; L foot drop; increased L hip flexion to clear and advance L LE  Stairs            Wheelchair Mobility    Modified Rankin (Stroke Patients Only)       Balance Overall balance assessment: Needs assistance Sitting-balance support: No upper extremity supported;Feet supported Sitting balance-Leahy Scale: Normal Sitting balance - Comments: steady sitting reaching outside BOS   Standing balance support: Single extremity supported Standing balance-Leahy Scale: Poor Standing balance comment: requires at least single UE support for static standing balance                             Pertinent Vitals/Pain Pain Assessment: 0-10 Pain Score: 0-No pain(0/10 at rest beginning/end of session; 4-5/10 with ROM) Pain Location: L knee Pain Descriptors / Indicators: Sore Pain Intervention(s): Limited activity within patient's tolerance;Monitored during session;Premedicated before session;Repositioned  HR WFL  during session    [place] expects to be discharged to:: Private residence Living Arrangements: Spouse/significant other Available Help at Discharge: Family Type of Home: House Home Access: Stairs to enter Entrance Stairs-Rails: None Entrance Stairs-Number of Steps: 3 Home Layout: Two level;Able to live on main level with  bedroom/bathroom(Plans to stay main level) Home Equipment: Walker - 4 wheels;Cane - quad;Cane - single point;Wheelchair - manual Additional Comments: Pt reports they are ordering him a hospital bed.    Prior Function Level of Independence: Independent with assistive device(s)         Comments: Pt ambulates with SPC and L LE brace.     Hand Dominance        Extremity/Trunk Assessment   Upper Extremity Assessment Upper Extremity Assessment: Overall WFL for tasks assessed    Lower Extremity Assessment Lower Extremity Assessment: RLE deficits/detail;LLE deficits/detail RLE Deficits / Details: strength and ROM WFL LLE Deficits / Details: able to perform SLR with L quad lag; good quad set; DF 0/5; at least 2/5 L PF; at least 2+/5 (limited d/t pain) L knee flexion/extension; L knee ROM grossly 10 degrees short of neutral to 70 degrees flexion (limited d/t pain) LLE: Unable to fully assess due to pain    Cervical / Trunk Assessment Cervical / Trunk Assessment: Normal  Communication   Communication: No difficulties  Cognition Arousal/Alertness: Awake/alert Behavior During Therapy: WFL for tasks assessed/performed Overall Cognitive Status: Within Functional Limits for tasks assessed                                        General Comments General comments (skin integrity, edema, etc.): L knee wrap in place.  Nursing cleared pt for participation in physical therapy.  Pt agreeable to PT session.    Exercises     Assessment/Plan    PT Assessment Patient needs continued PT services  PT Problem List Decreased strength;Decreased range of motion;Decreased activity tolerance;Decreased balance;Decreased mobility;Decreased knowledge of use of DME;Decreased knowledge of precautions;Pain       PT Treatment Interventions DME instruction;Gait training;Stair training;Functional mobility training;Therapeutic activities;Therapeutic exercise;Balance training;Patient/family  education    PT Goals (Current goals can be found in the Care Plan section)  Acute Rehab PT Goals Patient Stated Goal: to go home PT Goal Formulation: With patient Time For Goal Achievement: 01/01/18 Potential to Achieve Goals: Good    Frequency 7X/week   Barriers to discharge        Co-evaluation               AM-PAC PT "6 Clicks" Daily Activity  Outcome Measure Difficulty turning over in bed (including adjusting bedclothes, sheets and blankets)?: A Little Difficulty moving from lying on back to sitting on the side of the bed? : A Little Difficulty sitting down on and standing up from a chair with arms (e.g., wheelchair, bedside commode, etc,.)?: Unable Help needed moving to and from a bed to chair (including a wheelchair)?: A Little Help needed walking in hospital room?: A Little Help needed climbing 3-5 steps with a railing? : A Little 6 Click Score: 16    End of Session Equipment Utilized During Treatment: Gait belt Activity Tolerance: Patient tolerated treatment well Patient left: in chair;with call bell/phone within reach;with chair alarm set;with SCD's reapplied;Other (comment)(B heels elevated (L knee extension promoted); polar care in place and activated) Nurse Communication: Mobility status;Precautions;Weight bearing status;Other (comment)(Pt's pain status)  PT Visit Diagnosis: Other abnormalities of gait and mobility (R26.89);Muscle weakness (generalized) (M62.81);Difficulty in walking, not elsewhere classified (R26.2);Pain Pain - Right/Left: Left Pain - part of body: Knee    Time: 1543-1610 PT Time Calculation (min) (ACUTE ONLY): 27 min   Charges:   PT Evaluation $PT Eval Low Complexity: 1 Low PT Treatments $Therapeutic Activity: 8-22 mins      Leitha Bleak, PT 12/18/17, 4:35 PM [phone]

## 2017-12-18 NOTE — Progress Notes (Signed)
PT Cancellation Note  Patient Details Name: Edwin [name] MRN: [id number] DOB: Sep 28, 1970   Cancelled Treatment:    Reason Eval/Treat Not Completed: Patient not medically ready.  PT consult received.  Chart reviewed.  Pt's potassium noted to be elevated to 5.9 this morning.  Per PT guidelines for elevated potassium, will hold PT at this time and re-attempt PT evaluation this afternoon as appropriate (Potassium lab order noted for later today).  Leitha Bleak, PT 12/18/17, 8:34 AM [phone]

## 2017-12-18 NOTE — Progress Notes (Addendum)
Edwin [name] at [place] NAME: Edwin [name]    MR#:  [id number]  DATE OF BIRTH:  December 16, 1970  SUBJECTIVE:  CHIEF COMPLAINT:   Chief Complaint  Patient presents with  . Knee Pain   Orthopedic surgery would like hospitalist service to take over management of patient, hyperkalemia noted, worsening renal function on the ICU, most likely related to vancomycin use-infectious disease input appreciated, start Veltassa/Kayexalate, blood sugars in the 500s, start Lantus 30 units twice daily, give IV insulin now, start insulin with meals, start high-dose sliding scale insulin, telemetry for hyperkalemia, patient complains of left knee pain only REVIEW OF SYSTEMS:  CONSTITUTIONAL: No fever, fatigue or weakness.  EYES: No blurred or double vision.  EARS, NOSE, AND THROAT: No tinnitus or ear pain.  RESPIRATORY: No cough, shortness of breath, wheezing or hemoptysis.  CARDIOVASCULAR: No chest pain, orthopnea, edema.  GASTROINTESTINAL: No nausea, vomiting, diarrhea or abdominal pain.  GENITOURINARY: No dysuria, hematuria.  ENDOCRINE: No polyuria, nocturia,  HEMATOLOGY: No anemia, easy bruising or bleeding SKIN: No rash or lesion. MUSCULOSKELETAL: No joint pain or arthritis.   NEUROLOGIC: No tingling, numbness, weakness.  PSYCHIATRY: No anxiety or depression.   ROS  DRUG ALLERGIES:   Allergies  Allergen Reactions  . Morphine And Related     VITALS:  Blood pressure (!) 141/69, pulse 84, temperature 98.4 F (36.9 C), temperature source Oral, resp. rate 14, height 6\' 2"  (1.88 m), weight (!) 147.4 kg, SpO2 100 %.  PHYSICAL EXAMINATION:  GENERAL:  47 y.o.-year-old patient lying in the bed with no acute distress.  EYES: Pupils equal, round, reactive to light and accommodation. No scleral icterus. Extraocular muscles intact.  HEENT: Head atraumatic, normocephalic. Oropharynx and nasopharynx clear.  NECK:  Supple, no jugular venous distention. No thyroid  enlargement, no tenderness.  LUNGS: Normal breath sounds bilaterally, no wheezing, rales,rhonchi or crepitation. No use of accessory muscles of respiration.  CARDIOVASCULAR: S1, S2 normal. No murmurs, rubs, or gallops.  ABDOMEN: Soft, nontender, nondistended. Bowel sounds present. No organomegaly or mass.  EXTREMITIES: No pedal edema, cyanosis, or clubbing.  NEUROLOGIC: Cranial nerves II through XII are intact. Muscle strength 5/5 in all extremities. Sensation intact. Gait not checked.  PSYCHIATRIC: The patient is alert and oriented x 3.  SKIN: No obvious rash, lesion, or ulcer.   Physical Exam LABORATORY PANEL:   CBC Recent Labs  Lab 12/18/17 0521  WBC 11.8*  HGB 8.8*  HCT 26.5*  PLT 398   ------------------------------------------------------------------------------------------------------------------  Chemistries  Recent Labs  Lab 12/16/17 1339 12/17/17 0257 12/18/17 0521 12/18/17 1234  NA 132* 134* 132*  --   K 5.2* 5.1 5.9* 5.1  CL 101 102 100  --   CO2 21* 25 22  --   GLUCOSE 325* 380* 410*  --   BUN 44* 42* 54*  --   CREATININE 3.14* 3.12* 3.57*  --   CALCIUM 9.1 8.7* 8.3*  --   AST 10*  9* 9*  --   --   ALT 11  9 8   --   --   ALKPHOS 91  93  --   --   --   BILITOT 0.3  0.3  --   --   --    ------------------------------------------------------------------------------------------------------------------  Cardiac Enzymes No results for input(s): TROPONINI in the last 168 hours. ------------------------------------------------------------------------------------------------------------------  RADIOLOGY:  Ct Knee Left Wo Contrast  Result Date: 12/16/2017 CLINICAL DATA:  Knee pain and swelling. Joint effusion with gas in the  joint. Joint aspiration last week. EXAM: CT OF THE LEFT KNEE WITHOUT CONTRAST TECHNIQUE: Multidetector CT imaging of the left knee was performed according to the standard protocol. Multiplanar CT image reconstructions were also  generated. COMPARISON:  Radiographs dated 12/16/2017 FINDINGS: Bones/Joint/Cartilage There are patchy areas of osteopenia in the distal femur and in the patella without discrete cortical destruction. There is a large joint effusion with gas in the joint. 6 cm Baker's cyst. Ligaments Suboptimally assessed by CT. Intact cruciate and collateral ligaments. Dystrophic calcification in the lateral patellofemoral ligament, probably due to prior remote injury. Muscles and Tendons Negative. Soft tissues Slight nonspecific subcutaneous edema and soft tissue calcifications anterior to the patella and patellar tendon. IMPRESSION: 1. Large joint effusion containing gas, worrisome for infection. 2. Patchy osteopenia of the distal femur and patella which could be due to disuse but the possibility of early osteomyelitis should be considered. Electronically Signed   By: Lorriane Shire M.D.   On: 12/16/2017 15:38   US Renal  Result Date: 12/18/2017 CLINICAL DATA:  Chronic kidney disease. EXAM: RENAL / URINARY TRACT ULTRASOUND COMPLETE COMPARISON:  None. FINDINGS: Right Kidney: Length: 15.5 cm. Echogenicity within normal limits. No mass or hydronephrosis visualized. Left Kidney: Length: 13.3 cm. 1.8 cm partially exophytic simple cyst is seen in midpole. Echogenicity within normal limits. No mass or hydronephrosis visualized. Bladder: Appears normal for degree of bladder distention. IMPRESSION: No significant renal abnormality is noted. Electronically Signed   By: Marijo Conception, M.D.   On: 12/18/2017 09:20   Chest Portable 1 View  Result Date: 12/17/2017 CLINICAL DATA:  Preop EXAM: PORTABLE CHEST 1 VIEW COMPARISON:  None. FINDINGS: Heart is top-normal in size. Nonaneurysmal thoracic aorta. Right atrial and ventricular pacer leads are noted as well as a right ventricular ICD lead. Lungs are clear. Remote left-sided fourth through seventh posterior rib fractures are present. IMPRESSION: No active disease. Electronically Signed    By: Ashley Royalty M.D.   On: 12/17/2017 00:44   Dg Knee Complete 4 Views Left  Result Date: 12/16/2017 CLINICAL DATA:  LEFT knee pain for a month after fall. Sharp stabbing patella pain. EXAM: LEFT KNEE - COMPLETE 4+ VIEW COMPARISON:  None. FINDINGS: No evidence of fracture, dislocation, or joint effusion. Mild patella spurring. No evidence of arthropathy or other focal bone abnormality. Suprapatellar soft tissue swelling with large effusion and subcutaneous gas. No radiopaque foreign bodies. IMPRESSION: 1. Soft tissue swelling and large suprapatellar joint effusion with subcutaneous gas, gas-forming organism/septic arthropathy not excluded. Recommend correlation with procedural history. 2. No acute osseous process or advanced degenerative change. 3. Acute findings discussed with and reconfirmed by Dr.PAUL MALINDA on 12/16/2017 at 2:39 pm. Electronically Signed   By: Elon Alas M.D.   On: 12/16/2017 14:40    ASSESSMENT AND PLAN:  47 year old male with past medical history of coronary artery disease status post stent placement, previous history of MI, ischemic cardiomyopathy status post AICD, paroxysmal atrial fibrillation, diabetes, chronic kidney disease stage IV who presents to the hospital due to left knee pain.  *Acute Left knee pain/swelling Exact etiology unknown- ? Gout vs infection, favors gout Status post arthrocentesis in the emergency room-Gram stain/culture negative thus far Status post  arthroscopy with washout by orthopedic surgery on December 17, 2017, operative cultures negative thus far In discussion with orthopedic surgery-hospitalist will manage patient going forward as primary service, infectious disease to see-Per orthopedic surgery-infectious disease would like to continue antibiotics for another 24hours consult  Continue empiric vancomycin/cefepime, follow-up on  outstanding cultures/sensitivities Rheumatology input appreciated discharge-we will follow-up with Dr.  Jefm Bryant status post discharge for acute gouty flare  *Acute hyperkalemia Resolved with Veltassa, Kayexalate, and IV insulin  *AKI w/ CKD, IV Worsening most likely secondary to vancomycin use Nephrology to see for expert opinion, check renal ultrasound, avoid nephrotoxic agents, strict I&O monitoring, daily weights, BMP in the morning   *History of paroxysmal atrial fibrillation Stable Continue amiodarone, carvedilol Restart insulin-was previously held for 4 operative intervention  *Chronic CAD/MI Stable S/P stent placement 12/18 at [facility], atorvastatin, carvedilol  *Chronic uncontrolled IDDM Extubated by steroid use Patient's last hemoglobin A1c as per him was 11 Start Lantus 40 units twice daily, give IV insulin x1 now, NovoLog with meals, high-dose sliding scale insulin, Accu-Cheks per routine, diabetic educator input appreciated   *History of ICM without exacerbation  Stable Continue Bumex, carvedilol  *ChronicDepression without suicidal ideation Stable continue Prozac.  *Chronic GERD  without esophagitis Stable continue Pepcid  Position pending clinical course  All the records are reviewed and case discussed with Care Management/Social Workerr. Management plans discussed with the patient, family and they are in agreement.  CODE STATUS: full  TOTAL TIME TAKING CARE OF THIS PATIENT: 45 minutes.     POSSIBLE D/C IN 1-2 DAYS, DEPENDING ON CLINICAL CONDITION.   Avel Peace Edwin [name] M.D on 12/18/2017   Between 7am to 6pm - Pager - [phone]  After 6pm go to [email or web address] - password [email or web address] [facility]  Office  [phone]  CC: Primary care physician; Patient, No Pcp Per  Note: This dictation was prepared with Dragon dictation along with smaller phrase technology. Any transcriptional errors that result from this process are unintentional.

## 2017-12-18 NOTE — Progress Notes (Signed)
CBG rechecked at 0100, yielded 494. Dr. Jannifer Franklin paged and ordered for another one time dose regular insulin (Novolin R) 20 units IV and recheck CBG after an hour. Will continue to monitor.

## 2017-12-18 NOTE — Consult Note (Signed)
Date of Admission:  12/16/2017                 Reason for Consult: Septic arthritis   Referring Provider: Mack Guise  HPI:   Edwin [name] is a 47 y.o. male with a history of coronary artery disease, status post catheterization and 2 DES in LAD and is on warfarin and ticagrelor ,paroxysmal A. fib, cardiac pacemaker/ICD, diabetes mellitus, CKD gout was admitted to the hospital with swelling of the left knee.  As per patient he fell after his daughter's dog knocked him down from behind causing him to fall down approximately 8 steps.  HE sustained injury to the left knee and he was hospitalized at [facility] between 11/22/2017 and 11/25/2017 when he was found to have effusion in his knee and initially concern was for septic left knee but arthrocentesis was done and and found to have hemarthrosis.  Joint fluid did not show any crystals and the fluid culture remain negative.  Orthopedics did not feel that there was likely any further injury requiring work-up.  PT and OT evaluated him and he was eventually able to bear weight on the left leg and walk with assistance.  It was thought it was due to traumatic hemarthrosis of the knee in the setting of dual antiplatelet therapy and anticoagulation.  After discharge patient saw his PCP on 12/04/2017 and during that visit he was wearing a leg brace.  He continued to experience swelling tenderness and shooting pulsating pain.  She scheduled physical therapy for him.  On 12/15/2017 patient presented to the ED at [facility] complaining of chest pain and was evaluated and he left AMA.  He came to [facility] ED on 12/16/2017 complaining of left knee pain.  He underwent arthrocentesis and that showed WBC in the fluid of 90,000 with 94% neutrophils.  Orthopedics saw him and took him for washout on 12/17/2017 and I am asked to see the patient for septic knee.  So far the culture from the first arthrocentesis done on 12/16/2017 has been negative. Patient has a complicated medical history and in December  2018 was hospitalized at [facility] for a month with  NSTEMI which was complicated by cardiac arrest while undergoing cath , cardiogenic shock requiring Impella and VA ECMO and resultant renal failure briefly requiring hemodialysis. He was back in the hospital between January20 2019 until May 08, 2017 for recurrent chest pain.  And during that hospitalization he had ICD placement.  A left groin wound also was noted at the place of ECMO decannulation and it was treated with Augmentin.  During that hospitalization he also had a left knee x-ray which revealed quadriceps and patellar enthesopathy no joint effusion or lipohemarthrosis  Medical history CAD (coronary artery disease) (RAF-[name])  . Gout  . Hypercholesteremia  . Essential hypertension (RAF-[name])  . 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 Obesity, Class III, BMI 40-49.9 (morbid obesity) (CMS-[name])  . Osteoarthritis  . Chronic nonalcoholic liver disease  . Panic disorder  . Polyneuropathy in diabetes (CMS-[name])  . GERD (gastroesophageal reflux disease)  . Asthma  . Chronic pain syndrome  . Paroxysmal atrial fibrillation (CMS-[name])  . OSA (obstructive sleep apnea)  . Tobacco use disorder  . Status post cardiac pacemaker procedure  . Type 2 diabetes mellitus with stage 4 chronic kidney disease, with long-term current use of insulin (CMS-[name])  . Cardiac pacemaker in situ  . Recurrent major depressive disorder, in full remission (CMS-[name])  . Adjustment disorder, unspecified  . Acute  deep vein thrombosis (DVT) of proximal vein of both lower extremities (CMS-[name])  . Chronic heart failure with reduced ejection fraction and diastolic dysfunction (CMS-[name])  . Hyperkalemia  . CKD (chronic kidney disease) stage 4, GFR 15-29 ml/min (CMS-[name])  . Gastroenteritis  . Effusion of left knee joint  . Hemarthrosis involving knee joint  . Rhinovirus   PSH ECMO ICD Cardiac cath arthrocentesis   Social History   Tobacco Use  .  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 Smokeless tobacco: Current User    Types: Snuff  Substance Use Topics  . Alcohol use: Never    Frequency: Never  . Drug use: Never    Family History  Problem Relation Age of Onset  . Diabetes Mother   . Peripheral vascular disease Mother   . COPD Father      . allopurinol  50 mg Oral QODAY  . amiodarone  200 mg Oral Daily  . amLODipine  10 mg Oral Daily  . atorvastatin  80 mg Oral QPM  . bumetanide  2 mg Oral Daily  . carvedilol  25 mg Oral Q breakfast  . carvedilol  50 mg Oral Q supper  . cholecalciferol  5,000 Units Oral Daily  . docusate sodium  100 mg Oral BID  . ezetimibe  10 mg Oral Daily  . famotidine  20 mg Oral Daily  . FLUoxetine  60 mg Oral Daily  . gabapentin  100 mg Oral TID  . insulin aspart  0-20 Units Subcutaneous TID AC & HS  . insulin aspart  10 Units Subcutaneous TID WC  . insulin detemir  30 Units Subcutaneous BID  . patiromer  16.8 g Oral Daily  . sevelamer carbonate  1,600 mg Oral TID WC  . ticagrelor  90 mg Oral BID  . traMADol  50 mg Oral Q6H  . vancomycin variable dose per unstable renal function (pharmacist dosing)   Does not apply See admin instructions  . warfarin  12 mg Oral Once per day on Sun Tue Thu  . warfarin  15 mg Oral Once per day on Mon Wed Fri Sat  . Warfarin - Pharmacist Dosing Inpatient   Does not apply q1800      Abtx:  Vancomycin and Zosyn   Review of Systems: Review of Systems  Constitutional: Negative for chills, fever, malaise/fatigue and weight loss.  HENT: Negative for congestion.   Eyes: Negative for double vision.  Respiratory: Positive for shortness of breath. Negative for cough.   Cardiovascular: Positive for chest pain. Negative for orthopnea.       Gets intermittent chest pain  Gastrointestinal: Negative for abdominal pain, nausea and vomiting.  Genitourinary: Negative for dysuria and urgency.  Musculoskeletal: Positive for joint pain.  Skin: Negative for itching and  rash.  Neurological: Negative for dizziness, weakness and headaches.  Endo/Heme/Allergies: Does not bruise/bleed easily.  Psychiatric/Behavioral: The patient is not nervous/anxious.       Allergies  Allergen Reactions  . Morphine And Related     OBJECTIVE: Blood pressure 123/65, pulse 75, temperature 98.2 F (36.8 C), temperature source Oral, resp. rate 18, height 6\' 2"  (1.88 m), weight (!) 147.4 kg, SpO2 98 %.  Physical Exam  Constitutional: He is oriented to person, place, and time. He appears well-developed.  Increased BMI  HENT:  Head: Normocephalic and atraumatic.  Dry oral mucosa  Eyes: Pupils are equal, round, and reactive to light. EOM are normal.  Neck: Normal range of motion. Neck supple.  Cardiovascular:  s1s2 ,  no murmur  Pulmonary/Chest:  B/l air entry- decreased bases  Abdominal: Soft. He exhibits no mass. There is no tenderness.  Musculoskeletal:  Decreased range of movt left knee- brace present  Neurological: He is alert and oriented to person, place, and time.  Weakness left leg    Lab Results CBC    Component Value Date/Time   WBC 11.8 (H) 12/18/2017 0521   RBC 3.23 (L) 12/18/2017 0521   HGB 8.8 (L) 12/18/2017 0521   HCT 26.5 (L) 12/18/2017 0521   PLT 398 12/18/2017 0521   MCV 81.9 12/18/2017 0521   MCH 27.3 12/18/2017 0521   MCHC 33.3 12/18/2017 0521   RDW 16.4 (H) 12/18/2017 0521   LYMPHSABS 1.6 12/16/2017 1339   MONOABS 0.7 12/16/2017 1339   EOSABS 0.1 12/16/2017 1339   BASOSABS 0.1 12/16/2017 1339    CMP Latest Ref Rng & Units 12/18/2017 12/17/2017 12/16/2017  Glucose 70 - 99 mg/dL 410(H) 380(H) -  BUN 6 - 20 mg/dL 54(H) 42(H) -  Creatinine 0.61 - 1.24 mg/dL 3.57(H) 3.12(H) -  Sodium 135 - 145 mmol/L 132(L) 134(L) -  Potassium 3.5 - 5.1 mmol/L 5.9(H) 5.1 -  Chloride 98 - 111 mmol/L 100 102 -  CO2 22 - 32 mmol/L 22 25 -  Calcium 8.9 - 10.3 mg/dL 8.3(L) 8.7(L) -  Total Protein 6.5 - 8.1 g/dL - - 8.2(H)  Total Bilirubin 0.3 - 1.2 mg/dL -  - 0.3  Alkaline Phos 38 - 126 U/L - - 91  AST 15 - 41 U/L - 9(L) 10(L)  ALT 0 - 44 U/L - 8 11    Microbiology:  Synovial fluid- 9/18 WBC 90,772 ( 94% N) Uric acid crystals seen Gram stain no organism- Neg culture so far     Radiographs and labs were personally reviewed by me.   Assessment and Plan 47 y.o. male with a history of coronary artery disease, status post catheterization and 2 DES in LAD and is on warfarin and ticagrelor ,paroxysmal A. fib, cardiac pacemaker/ICD, diabetes mellitus, CKD gout was admitted to the hospital with swelling of the left knee.  As per patient he fell after his daughter's dog knocked him down from behind causing him to fall down approximately 8 steps.  HE sustained injury to the left knee and he was hospitalized at [facility] between 11/22/2017 and 11/25/2017 when he was found to have effusion in his knee and initially concern was for septic left knee but arthrocentesis was done and and found to have hemarthrosis.  Joint fluid did not show any crystals and the fluid culture remain negative.  Orthopedics did not feel that there was likely any further injury requiring work-up.  PT and OT evaluated him and he was eventually able to bear weight on the left leg and walk with assistance.  It was thought it was due to traumatic hemarthrosis of the knee in the setting of dual antiplatelet therapy and anticoagulation.  After discharge patient saw his PCP on 12/04/2017 and during that visit he was wearing a leg brace.  He continued to experience swelling tenderness and shooting pulsating pain.  She scheduled physical therapy for him.  On 12/15/2017 patient presented to the ED at [facility] complaining of chest pain and was evaluated and he left AMA.  He came to [facility] ED on 12/16/2017 complaining of left knee pain.  He underwent arthrocentesis and that showed WBC in the fluid of 90,000 with 94% neutrophils.  Orthopedics saw him and took him for washout on  12/17/2017 and I am asked to see the patient  for septic knee.  So far the culture from the first arthrocentesis done on 12/16/2017 has been negative.  Left knee effusion with pain.  Gouty arthritis.  Concern for septic knee because of the gas pattern.  He has had this swelling since August 2019 and has been aspirated twice in August.  Not sure whether the gas pattern is from that procedure.  Currently on vancomycin and Zosyn.  Change the latter to cefepime and Flagyl because of CKD.Marland Kitchen  Continue IV antibiotics until the cultures finalize. if the culture is negative then we can stop the IV antibiotics and plan on oral Augmentin for a few days.  Complicated cardiac history.  CAD, status post stents Status post cardiac arrest has had ECMO.  Diabetes mellitus  CKD  Obstructive sleep apnea  Hypertension  Bilateral DVT  Left leg weakness  Discussed the management with the patient and Dr. Mack Guise I am not on call this weekend- if needed please text me  Tsosie Billing, MD  12/18/2017, 10:29 AM  Note:  This document was prepared using Dragon voice recognition software and [place] include unintentional dictation errors.

## 2017-12-18 NOTE — Progress Notes (Signed)
ANTICOAGULATION CONSULT NOTE - Initial Consult  Pharmacy Consult for Warfarin  Indication: atrial fibrillation  Allergies  Allergen Reactions  . Morphine And Related     Patient Measurements: Height: 6\' 2"  (188 cm) Weight: (!) 325 lb (147.4 kg) IBW/kg (Calculated) : 82.2 Heparin Dosing Weight:    Vital Signs: Temp: 98.2 F (36.8 C) (09/20 0802) Temp Source: Oral (09/20 0802) BP: 123/65 (09/20 0802) Pulse Rate: 75 (09/20 0802)  Labs: Recent Labs    12/16/17 1339 12/16/17 1442 12/17/17 0257 12/18/17 0521  HGB 10.1*  --  8.6* 8.8*  HCT 30.4*  --  26.0* 26.5*  PLT 421  --  374 398  APTT  --   --  52*  --   LABPROT  --  23.4* 20.7* 15.2  INR  --  2.10 1.80 1.21  CREATININE 3.14*  --  3.12* 3.57*    Estimated Creatinine Clearance: 38.3 mL/min (A) (by C-G formula based on SCr of 3.57 mg/dL (H)).   Medical History: Past Medical History:  Diagnosis Date  . CAD (coronary artery disease)   . Diabetes mellitus without complication ([place])   . Heart attack ([place])   . Hyperlipidemia   . Hypertension   . Paroxysmal atrial fibrillation ([name])     Medications:  Scheduled:  . allopurinol  50 mg Oral QODAY  . amiodarone  200 mg Oral Daily  . amLODipine  10 mg Oral Daily  . atorvastatin  80 mg Oral QPM  . bumetanide  2 mg Oral Daily  . carvedilol  25 mg Oral Q breakfast  . carvedilol  50 mg Oral Q supper  . cholecalciferol  5,000 Units Oral Daily  . docusate sodium  100 mg Oral BID  . ezetimibe  10 mg Oral Daily  . famotidine  20 mg Oral Daily  . FLUoxetine  60 mg Oral Daily  . gabapentin  100 mg Oral TID  . insulin aspart  0-20 Units Subcutaneous TID AC & HS  . insulin aspart  10 Units Intravenous Once  . insulin aspart  10 Units Subcutaneous TID WC  . insulin detemir  30 Units Subcutaneous BID  . patiromer  16.8 g Oral Daily  . sevelamer carbonate  1,600 mg Oral TID WC  . sodium polystyrene  30 g Oral Once  . ticagrelor  90 mg Oral BID  . traMADol  50 mg Oral Q6H   . vancomycin variable dose per unstable renal function (pharmacist dosing)   Does not apply See admin instructions  . warfarin  12 mg Oral Once per day on Sun Tue Thu  . warfarin  15 mg Oral Once per day on Mon Wed Fri Sat  . Warfarin - Pharmacist Dosing Inpatient   Does not apply q1800   Infusions:  . sodium chloride Stopped (12/18/17 0515)  . ceFEPime (MAXIPIME) IV    . methocarbamol (ROBAXIN) IV    . piperacillin-tazobactam (ZOSYN)  IV 4.5 g (12/18/17 0307)  . vancomycin 2,000 mg (12/18/17 7893)    Assessment: 47 yo M on Warfarin PTA for Afib. Warfarin had been held for surgery, now restarting. Patient received Vit K 10 mg IV x 1 today 12/17/17. Home warfarin (per Chart review encounter note from 12/04/17):  Warfarin 12 mg Tues,Thur, Sun Warfarin 15 mg Mon, Wed, Fri, Sat  9/19  INR 1.80  12 mg 9/20  INR 1.21     Goal of Therapy:  INR 2-3 Monitor platelets by anticoagulation protocol: Yes   Plan:  Patient on Amiodarone (was on it PTA also), Brilinta, Zosyn. F/u INR daily.  Watch for delay in therapeutic INR from Vit K. Due to administration of Vit K expected delay with INR change, will continue with Warfarin 15 mg dose tonight.  Forrest Moron, PharmD 12/18/2017,9:01 AM

## 2017-12-18 NOTE — Progress Notes (Signed)
Inpatient Diabetes Program Recommendations  AACE/ADA: New Consensus Statement on Inpatient Glycemic Control (2019)  Target Ranges:  Prepandial:   less than 140 mg/dL      Peak postprandial:   less than 180 mg/dL (1-2 hours)      Critically ill patients:  140 - 180 mg/dL   Results for Edwin [name], Edwin [name] (MRN [id number]) as of 12/18/2017 13:30  Ref. Range 12/18/2017 00:54 12/18/2017 04:04 12/18/2017 08:00 12/18/2017 11:08 12/18/2017 12:06  Glucose-Capillary Latest Ref Range: 70 - 99 mg/dL 494 (H) 406 (H) 374 (H) 403 (H) 407 (H)  Results for Edwin [name], Edwin [name] (MRN [id number]) as of 12/18/2017 13:30  Ref. Range 12/17/2017 14:18 12/17/2017 16:45 12/17/2017 21:00 12/17/2017 23:34  Glucose-Capillary Latest Ref Range: 70 - 99 mg/dL 218 (H) 308 (H) 488 (H) 518 (HH)  Results for Edwin [name], Edwin [name] (MRN [id number]) as of 12/18/2017 13:30  Ref. Range 12/17/2017 02:57  Hemoglobin A1C Latest Ref Range: 4.8 - 5.6 % 12.0 (H)   Review of Glycemic Control  Diabetes history:DM2  Outpatient Diabetes medications: NPH 14 units BID  Current orders for Inpatient glycemic control: Levemir30 units BID, Novolog 0-20 units AC&HS, Novolog 10 units TID with meals   Inpatient Diabetes Program Recommendations:  Insulin - Basal: Noted Levemir increased to 30 units BID today. Insulin - Meal Coverage: Noted Novolog 10 units TID with meals ordered today for meal coverage.  HgbA1C: A1C 12% on 12/17/17 indicating an average glucose of 298 mg/dl over the past 2-3 months. Patient will likely need outpatient DM medications adjustments to improve DM control and follow up with PCP or Endocrinologist regarding DM control.  NOTE: Spoke with patient about diabetes and home regimen for diabetes control. Patient reports that he is followed by PCP for diabetes management and currently he takes NPH 14 units BID as an outpatient for diabetes control. Patient reports that he is only taking NPH for DM at home.  Patient states that he checks his glucose at home 2-3  times per day and that is usually in the upper 200's mg/dl range.  Inquired about prior A1C and patient reports that his last A1C was 11.2% on 12/04/17 but notes that he had it down to 5.5% in March 2019. Inquired about any changes in DM medications since he had his A1C down to his last office visit. Patient states that he was not on any other DM medications when he had his A1C down that much but he notes his food was portioned out and he exercised daily. Patient states that he tries to watch his diet but activity is limited due to foot drop of left foot and now left knee injury and pain.  Discussed A1C results (12% on 12/17/17) and explained that his current A1C indicates an average glucose of 298 mg/dl over the past 2-3 months. Discussed glucose and A1C goals. Discussed importance of checking CBGs and maintaining good CBG control to prevent long-term and short-term complications.  Stressed to the patient the importance of improving glycemic control to prevent further complications from uncontrolled diabetes. Discussed impact of nutrition, exercise, stress, sickness, and medications on diabetes control. Informed patient he did receive Decadron 5 mg on 12/17/17 which is contributing to hyperglycemia. Patient states that his glucose is usually not in the 300-400's mg/dl as it has been here over the past 24 hours.  Informed patient, he will likely need adjustments made to outpatient insulin regimen and MD Adolph even want him to take additional short acting insulin. Patient is agreeable to make  insulin adjustments as an outpatient. Patient reports that he has a follow up appointment with PCP next week. Encouraged patient to monitor glucose closely at home and keep a detailed record of glucose readings and insulin taken so he can take with him to follow up appointment next week.  Patient verbalized understanding of information discussed and he states that he has no further questions at this time related to  diabetes.  Thanks, Barnie Alderman, RN, MSN, CDE Diabetes Coordinator Inpatient Diabetes Program [phone] (Team Pager)

## 2017-12-18 NOTE — Care Management Important Message (Signed)
Copy of signed IM left with patient in room.  

## 2017-12-18 NOTE — Progress Notes (Signed)
OT Cancellation Note  Patient Details Name: Edwin [name] MRN: [id number] DOB: [birth year]   Cancelled Treatment:    Reason Eval/Treat Not Completed: Medical issues which prohibited therapy Order received and chart reviewed.  Pt's potassium noted to be elevated to 5.9 this morning.  Per OT guidelines for elevated potassium, will hold OT at this time and re-attempt OT evaluation this afternoon as appropriate (Potassium lab order noted for later today) or tomorrow. Thank you for the referral.  Chrys Racer, OTR/L ascom [phone] 12/18/17, 10:56 AM

## 2017-12-19 LAB — BASIC METABOLIC PANEL
ANION GAP: 10 (ref 5–15)
BUN: 55 mg/dL — ABNORMAL HIGH (ref 6–20)
CHLORIDE: 100 mmol/L (ref 98–111)
CO2: 22 mmol/L (ref 22–32)
CREATININE: 3.14 mg/dL — AB (ref 0.61–1.24)
Calcium: 8.7 mg/dL — ABNORMAL LOW (ref 8.9–10.3)
GFR calc non Af Amer: 22 mL/min — ABNORMAL LOW (ref >60)
GFR, EST AFRICAN AMERICAN: 25 mL/min — AB (ref >60)
Glucose, Bld: 525 mg/dL (ref 70–99)
POTASSIUM: 5.5 mmol/L — AB (ref 3.5–5.1)
Sodium: 132 mmol/L — ABNORMAL LOW (ref 135–145)

## 2017-12-19 LAB — CBC
HEMATOCRIT: 25.8 % — AB (ref 40.0–52.0)
Hemoglobin: 8.4 g/dL — ABNORMAL LOW (ref 13.0–18.0)
MCH: 26.8 pg (ref 26.0–34.0)
MCHC: 32.8 g/dL (ref 32.0–36.0)
MCV: 81.7 fL (ref 80.0–100.0)
PLATELETS: 386 10*3/uL (ref 150–440)
RBC: 3.15 MIL/uL — AB (ref 4.40–5.90)
RDW: 16.4 % — ABNORMAL HIGH (ref 11.5–14.5)
WBC: 11.3 10*3/uL — ABNORMAL HIGH (ref 3.8–10.6)

## 2017-12-19 LAB — GLUCOSE, CAPILLARY
GLUCOSE-CAPILLARY: 541 mg/dL — AB (ref 70–99)
GLUCOSE-CAPILLARY: 226 mg/dL — AB (ref 70–99)
Glucose-Capillary: 503 mg/dL (ref 70–99)
Glucose-Capillary: 377 mg/dL — ABNORMAL HIGH (ref 70–99)

## 2017-12-19 LAB — PROTIME-INR
INR: 1.23
Prothrombin Time: 15.4 seconds — ABNORMAL HIGH (ref 11.4–15.2)

## 2017-12-19 MED ORDER — INSULIN ASPART 100 UNIT/ML ~~LOC~~ SOLN
20.0000 [IU] | Freq: Three times a day (TID) | SUBCUTANEOUS | Status: DC
  Administered 2017-12-19: 20 [IU] via SUBCUTANEOUS
  Filled 2017-12-19: qty 1

## 2017-12-19 MED ORDER — INSULIN ASPART 100 UNIT/ML ~~LOC~~ SOLN
25.0000 [IU] | Freq: Three times a day (TID) | SUBCUTANEOUS | Status: DC
  Administered 2017-12-19: 25 [IU] via SUBCUTANEOUS
  Filled 2017-12-19: qty 1

## 2017-12-19 MED ORDER — INSULIN DETEMIR 100 UNIT/ML ~~LOC~~ SOLN
50.0000 [IU] | Freq: Two times a day (BID) | SUBCUTANEOUS | Status: DC
  Administered 2017-12-19: 50 [IU] via SUBCUTANEOUS
  Filled 2017-12-19 (×3): qty 0.5

## 2017-12-19 NOTE — Progress Notes (Signed)
PT Cancellation Note  Patient Details Name: Edwin [name] MRN: [id number] DOB: April 19, 1970   Cancelled Treatment:    Reason Eval/Treat Not Completed: Medical issues which prohibited therapy. Pt with elevated potassium today (5.5). Guidelines recommend holding PT > 5.1. Hold treatment today. Re attempt at a later day as lab values allow.    Larae Grooms, PTA 12/19/2017, 12:27 PM

## 2017-12-19 NOTE — Consult Note (Signed)
[facility]  CONSULT NOTE    Date: 12/19/2017                  Patient Name:  Edwin [name]  MRN: [id number]  DOB: 10/29/1970  Age / Sex: 47 y.o., male         PCP: Patient, No Pcp Per                 Service Requesting Consult: Dr. Verdell Carmine                 Reason for Consult: Chronic kidney disease stage IV            History of Present Illness: Mr. Panagiotis Oelkers Old is a 47 y.o. white male with diabetes mellitus type II, hypertension, hyperlipidemia, COPD/asthma with tobacco use, obstructive sleep apnea, coronary artery disease, NASH, gout, TIA, who was admitted to [facility] on 12/16/2017 for Arthritis due to other bacteria, septic arthritis of unspecified location ([place]) [A49.8, M01.X0]   Patient underwent left knee arthroscopy debridement on 9/19 by Dr. Mack Guise. 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 Started on steroids and allopurinol. Empirically on IV antibiotics.   Patient found to have hyperkalemia on admission. Started on veltassa.   Patient is on IV fluids and bumex.    Medications: Outpatient medications: Medications Prior to Admission  Medication Sig Dispense Refill Last Dose  . amiodarone (PACERONE) 200 MG tablet Take 200 mg by mouth daily.   12/16/2017 at 0800  . amLODipine (NORVASC) 10 MG tablet Take 10 mg by mouth daily.   12/16/2017 at 0800  . atorvastatin (LIPITOR) 80 MG tablet Take 80 mg by mouth every evening.    12/15/2017 at 1900  . BRILINTA 90 MG TABS tablet Take 90 mg by mouth 2 (two) times daily.   12/16/2017 at 0800  . bumetanide (BUMEX) 2 MG tablet Take 2 mg by mouth daily.   12/16/2017 at 0800  . carvedilol (COREG) 25 MG tablet See admin instructions. Take 1 tablet (25MG ) by mouth every morning and 2 tablets (50MG ) by mouth every night   12/17/2017 at 0800  . cholecalciferol (VITAMIN D) 1000 units tablet Take 5,000 Units by mouth daily.   12/16/2017 at 0800  . ezetimibe (ZETIA) 10 MG tablet Take 10 mg by mouth daily.   12/16/2017 at 0800  .  famotidine (PEPCID) 20 MG tablet Take 20 mg by mouth daily.   12/16/2017 at 0800  . FLUoxetine (PROZAC) 20 MG capsule Take 60 mg by mouth daily.   12/16/2017 at 0800  . HUMULIN N KWIKPEN 100 UNIT/ML Kiwkpen Inject 15 Units into the skin 2 (two) times daily.   12/16/2017 at 0800  . RENVELA 800 MG tablet Take 1,600 mg by mouth 3 (three) times daily with meals.   12/16/2017 at 0800  . VOLTAREN 1 % GEL Apply 2 g topically 3 (three) times daily.   As directed at As directed  . warfarin (COUMADIN) 6 MG tablet See admin instructions. Take 2 tablets (15MG ) by mouth daily on Monday, Wednesday, Friday and Saturday and take 2 tablets (12MG ) by mouth daily on Tuesday, Thursday and Sunday   12/15/2017 at 1900    Current medications: Current Facility-Administered Medications  Medication Dose Route Frequency Provider Last Rate Last Dose  . acetaminophen (TYLENOL) tablet 325-650 mg  325-650 mg Oral Q6H PRN Thornton Park, MD      . allopurinol (ZYLOPRIM) tablet 50 mg  50 mg Oral Ivor Messier, MD   50  mg at 12/19/17 1107  . amiodarone (PACERONE) tablet 200 mg  200 mg Oral Daily Thornton Park, MD   200 mg at 12/19/17 1107  . amLODipine (NORVASC) tablet 10 mg  10 mg Oral Daily Thornton Park, MD   10 mg at 12/19/17 1107  . atorvastatin (LIPITOR) tablet 80 mg  80 mg Oral QPM Thornton Park, MD   80 mg at 12/18/17 1745  . bisacodyl (DULCOLAX) EC tablet 10 mg  10 mg Oral Daily PRN Thornton Park, MD      . bumetanide Cleda Clarks) tablet 2 mg  2 mg Oral Daily Thornton Park, MD   2 mg at 12/19/17 1106  . carvedilol (COREG) tablet 25 mg  25 mg Oral Q breakfast Thornton Park, MD   25 mg at 12/19/17 1572  . carvedilol (COREG) tablet 50 mg  50 mg Oral Q supper Thornton Park, MD   50 mg at 12/18/17 1745  . ceFEPIme (MAXIPIME) 1 g in sodium chloride 0.9 % 100 mL IVPB  1 g Intravenous Once Thornton Park, MD      . cholecalciferol (VITAMIN D) tablet 5,000 Units  5,000 Units Oral Daily Thornton Park,  MD   5,000 Units at 12/19/17 1106  . diphenhydrAMINE (BENADRYL) 12.5 MG/5ML elixir 12.5-25 mg  12.5-25 mg Oral Q4H PRN Thornton Park, MD      . docusate sodium (COLACE) capsule 100 mg  100 mg Oral BID Thornton Park, MD   100 mg at 12/19/17 1107  . ezetimibe (ZETIA) tablet 10 mg  10 mg Oral Daily Thornton Park, MD   10 mg at 12/19/17 1106  . famotidine (PEPCID) tablet 20 mg  20 mg Oral Daily Thornton Park, MD   20 mg at 12/19/17 1108  . FLUoxetine (PROZAC) capsule 60 mg  60 mg Oral Daily Thornton Park, MD   60 mg at 12/19/17 1105  . gabapentin (NEURONTIN) capsule 100 mg  100 mg Oral TID Loney Hering D, MD   100 mg at 12/19/17 1106  . HYDROcodone-acetaminophen (NORCO) 7.5-325 MG per tablet 1-2 tablet  1-2 tablet Oral Q4H PRN Thornton Park, MD      . HYDROcodone-acetaminophen (NORCO/VICODIN) 5-325 MG per tablet 1-2 tablet  1-2 tablet Oral Q4H PRN Thornton Park, MD   2 tablet at 12/18/17 1423  . insulin aspart (novoLOG) injection 0-20 Units  0-20 Units Subcutaneous TID AC & HS Lance Coon, MD   20 Units at 12/19/17 0820  . insulin aspart (novoLOG) injection 20 Units  20 Units Subcutaneous TID WC Sainani, Vivek J, MD      . insulin detemir (LEVEMIR) injection 50 Units  50 Units Subcutaneous BID Henreitta Leber, MD      . magnesium citrate solution 1 Bottle  1 Bottle Oral Once PRN Thornton Park, MD      . menthol-cetylpyridinium (CEPACOL) lozenge 3 mg  1 lozenge Oral PRN Thornton Park, MD       Or  . phenol (CHLORASEPTIC) mouth spray 1 spray  1 spray Mouth/Throat PRN Thornton Park, MD      . methocarbamol (ROBAXIN) tablet 500 mg  500 mg Oral Q6H PRN Thornton Park, MD   500 mg at 12/17/17 2358   Or  . methocarbamol (ROBAXIN) 500 mg in dextrose 5 % 50 mL IVPB  500 mg Intravenous Q6H PRN Thornton Park, MD      . metroNIDAZOLE (FLAGYL) IVPB 500 mg  500 mg Intravenous Q8H Tsosie Billing, MD 100 mL/hr at 12/19/17 0832 500 mg at 12/19/17 0832  .  ondansetron  (ZOFRAN) tablet 4 mg  4 mg Oral Q6H PRN Thornton Park, MD       Or  . ondansetron [facility]) injection 4 mg  4 mg Intravenous Q6H PRN Thornton Park, MD      . patiromer [facility]) packet 16.8 g  16.8 g Oral Daily Thornton Park, MD   16.8 g at 12/19/17 1110  . senna-docusate (Senokot-S) tablet 1 tablet  1 tablet Oral QHS PRN Thornton Park, MD      . sevelamer carbonate (RENVELA) tablet 1,600 mg  1,600 mg Oral TID WC Thornton Park, MD   1,600 mg at 12/19/17 1123  . ticagrelor (BRILINTA) tablet 90 mg  90 mg Oral BID Thornton Park, MD   90 mg at 12/19/17 1108  . traMADol (ULTRAM) tablet 50 mg  50 mg Oral Q6H Thornton Park, MD   50 mg at 12/19/17 1123  . vancomycin (VANCOCIN) 2,000 mg in sodium chloride 0.9 % 500 mL IVPB  2,000 mg Intravenous Q24H Thornton Park, MD 250 mL/hr at 12/19/17 1114 2,000 mg at 12/19/17 1114  . warfarin (COUMADIN) tablet 12 mg  12 mg Oral Once per day on Sun Tue Thu Krasinski, Kevin, MD   12 mg at 12/17/17 2038  . warfarin (COUMADIN) tablet 15 mg  15 mg Oral Once per day on Mon Wed Fri Sat Thornton Park, MD   15 mg at 12/18/17 1745  . Warfarin - Pharmacist Dosing Inpatient   Does not apply P1025 Thornton Park, MD          Allergies: Allergies  Allergen Reactions  . Morphine And Related       Past Medical History: Past Medical History:  Diagnosis Date  . CAD (coronary artery disease)   . Diabetes mellitus without complication ([place])   . Heart attack ([place])   . Hyperlipidemia   . Hypertension   . Paroxysmal atrial fibrillation [facility])      Past Surgical History: Past Surgical History:  Procedure Laterality Date  . CARDIAC SURGERY    . IRRIGATION AND DEBRIDEMENT KNEE Left 12/17/2017   Procedure: LEFT KNEE DEBRIDEMENT AND SYNOVECTOMY;  Surgeon: Thornton Park, MD;  Location: [facility] ORS;  Service: Orthopedics;  Laterality: Left;  . JOINT REPLACEMENT       Family History: Family History  Problem Relation Age of Onset  . Diabetes  Mother   . Peripheral vascular disease Mother   . COPD Father      Social History: Social History   Socioeconomic History  . Marital status: Unknown    Spouse name: Not on file  . Number of children: Not on file  . Years of education: Not on file  . Highest education level: Not on file  Occupational History  . Not on file  Social Needs  . Financial resource strain: Not on file  . Food insecurity:    Worry: Not on file    Inability: Not on file  . Transportation needs:    Medical: Not on file    Non-medical: Not on file  Tobacco Use  . 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 Smokeless tobacco: Current User    Types: Snuff  Substance and Sexual Activity  . Alcohol use: Never    Frequency: Never  . Drug use: Never  . Sexual activity: Not on file  Lifestyle  . Physical activity:    Days per week: Not on file    Minutes per session: Not on file  . Stress: Not on file  Relationships  .  Social connections:    Talks on phone: Not on file    Gets together: Not on file    Attends religious service: Not on file    Active member of club or organization: Not on file    Attends meetings of clubs or organizations: Not on file    Relationship status: Not on file  . Intimate partner violence:    Fear of current or ex partner: Not on file    Emotionally abused: Not on file    Physically abused: Not on file    Forced sexual activity: Not on file  Other Topics Concern  . Not on file  Social History Narrative  . Not on file     Review of Systems: Review of Systems  Constitutional: Positive for chills, diaphoresis, fever, malaise/fatigue and weight loss.  HENT: Negative.  Negative for congestion, ear discharge, ear pain, hearing loss, nosebleeds, sinus pain, sore throat and tinnitus.   Eyes: Negative.  Negative for blurred vision, double vision, photophobia, pain, discharge and redness.  Respiratory: Negative.  Negative for cough, hemoptysis, sputum production, shortness of breath,  wheezing and stridor.   Cardiovascular: Negative.  Negative for chest pain, palpitations, orthopnea, claudication, leg swelling and PND.  Gastrointestinal: Negative.  Negative for abdominal pain, blood in stool, constipation, diarrhea, heartburn, melena, nausea and vomiting.  Genitourinary: Negative.  Negative for dysuria, flank pain, frequency, hematuria and urgency.  Musculoskeletal: Positive for joint pain. Negative for back pain, falls, myalgias and neck pain.  Skin: Negative.  Negative for rash.  Neurological: Negative.  Negative for dizziness, tingling, tremors, sensory change, speech change, focal weakness, seizures, loss of consciousness, weakness and headaches.  Endo/Heme/Allergies: Negative.  Negative for environmental allergies and polydipsia. Does not bruise/bleed easily.  Psychiatric/Behavioral: Negative.  Negative for depression, hallucinations, memory loss, substance abuse and suicidal ideas. The patient is not nervous/anxious and does not have insomnia.     Vital Signs: Blood pressure (!) 163/81, pulse 70, temperature 97.7 F (36.5 C), temperature source Oral, resp. rate 20, height 6\' 2"  (1.88 m), weight (!) 150.7 kg, SpO2 99 %.  Weight trends: Filed Weights   12/16/17 1333 12/17/17 1100 12/19/17 0500  Weight: (!) 147.4 kg (!) 147.4 kg (!) 150.7 kg    Physical Exam: General: NAD,   Head: Normocephalic, atraumatic. Moist oral mucosal membranes  Eyes: Anicteric, PERRL  Neck: Supple, trachea midline  Lungs:  Clear to auscultation  Heart: Regular rate and rhythm  Abdomen:  Soft, nontender,   Extremities:  no peripheral edema. Left foot in dressings  Neurologic: Nonfocal, moving all four extremities  Skin: No lesions         Lab results: Basic Metabolic Panel: Recent Labs  Lab 12/17/17 0257 12/18/17 0521 12/18/17 1234 12/18/17 1530 12/19/17 0519  NA 134* 132*  --   --  132*  K 5.1 5.9* 5.1  --  5.5*  CL 102 100  --   --  100  CO2 25 22  --   --  22  GLUCOSE  380* 410*  --   --  525*  BUN 42* 54*  --   --  55*  CREATININE 3.12* 3.57*  --   --  3.14*  CALCIUM 8.7* 8.3*  --   --  8.7*  PHOS  --   --   --  4.0  --     Liver Function Tests: Recent Labs  Lab 12/16/17 1339 12/17/17 0257  AST 10*  9* 9*  ALT 11  9 8  ALKPHOS 91  93  --   BILITOT 0.3  0.3  --   PROT 8.2*  8.4*  --   ALBUMIN 2.8*  2.9*  --    No results for input(s): LIPASE, AMYLASE in the last 168 hours. No results for input(s): AMMONIA in the last 168 hours.  CBC: Recent Labs  Lab 12/16/17 1339 12/17/17 0257 12/18/17 0521 12/19/17 0519  WBC 10.6 8.1 11.8* 11.3*  NEUTROABS 8.1*  --   --   --   HGB 10.1* 8.6* 8.8* 8.4*  HCT 30.4* 26.0* 26.5* 25.8*  MCV 80.6 80.8 81.9 81.7  PLT 421 374 398 386    Cardiac Enzymes: No results for input(s): CKTOTAL, CKMB, CKMBINDEX, TROPONINI in the last 168 hours.  BNP: Invalid input(s): POCBNP  CBG: Recent Labs  Lab 12/18/17 1524 12/18/17 1645 12/18/17 2059 12/19/17 0737 12/19/17 1148  GLUCAP 205* 184* 202* 541* 74*    Microbiology: Results for orders placed or performed during the hospital encounter of 12/16/17  Body fluid culture     Status: None (Preliminary result)   Collection Time: 12/16/17  5:47 PM  Result Value Ref Range Status   Specimen Description   Final    KNEE LEFT KNEE Performed at [facility], [address] [place], [place] [address]    Special Requests   Final    Normal Performed at [facility], [address] [place], [place] [address]    Gram Stain   Final    ABUNDANT WBC PRESENT, PREDOMINANTLY PMN NO ORGANISMS SEEN    Culture   Final    NO GROWTH 3 DAYS Performed at [facility], [place] [address], [place], [place] [address]    Report Status PENDING  Incomplete  Surgical pcr screen     Status: None   Collection Time: 12/17/17 12:20 AM  Result Value Ref Range Status   MRSA, PCR NEGATIVE NEGATIVE Final   Staphylococcus aureus NEGATIVE NEGATIVE Final     Comment: (NOTE) The Xpert SA Assay (FDA approved for NASAL specimens in patients 70 years of age and older), is one component of a comprehensive surveillance program. It is not intended to diagnose infection nor to guide or monitor treatment. Performed at [facility], [address] [place], [place] [address]   Aerobic/Anaerobic Culture (surgical/deep wound)     Status: None (Preliminary result)   Collection Time: 12/17/17 12:18 PM  Result Value Ref Range Status   Specimen Description   Final    WOUND Performed at [facility], [address], [place], [place] [address]    Special Requests   Final    NONE Performed at [facility], [address] [place], [place] [address]    Gram Stain   Final    MODERATE WBC PRESENT, PREDOMINANTLY PMN NO ORGANISMS SEEN    Culture   Final    NO GROWTH < 24 HOURS Performed at [facility], [place] [address] [place],  [address]    Report Status PENDING  Incomplete    Coagulation Studies: Recent Labs    12/17/17 0257 12/18/17 0521 12/19/17 0519  LABPROT 20.7* 15.2 15.4*  INR 1.80 1.21 1.23    Urinalysis: No results for input(s): COLORURINE, LABSPEC, PHURINE, GLUCOSEU, HGBUR, BILIRUBINUR, KETONESUR, PROTEINUR, UROBILINOGEN, NITRITE, LEUKOCYTESUR in the last 72 hours.  Invalid input(s): APPERANCEUR    Imaging: US Renal  Result Date: 12/18/2017 CLINICAL DATA:  Chronic kidney disease. EXAM: RENAL / URINARY  TRACT ULTRASOUND COMPLETE COMPARISON:  None. FINDINGS: Right Kidney: Length: 15.5 cm. Echogenicity within normal limits. No mass or hydronephrosis visualized. Left Kidney: Length: 13.3 cm. 1.8 cm partially exophytic simple cyst is seen in midpole. Echogenicity within normal limits. No mass or hydronephrosis visualized. Bladder: Appears normal for degree of bladder distention. IMPRESSION: No significant renal abnormality is noted. Electronically Signed   By: Marijo Conception, M.D.   On:  12/18/2017 09:20      Assessment & Plan: Mr. Marquarius Lofton Bertha is a 47 y.o. white male with diabetes mellitus type II, hypertension, hyperlipidemia, COPD/asthma with tobacco use, obstructive sleep apnea, coronary artery disease, NASH, gout, TIA, atrial fibrillation who was admitted to [facility] on 12/16/2017 for Arthritis due to other bacteria, septic arthritis of unspecified location ([place]) [A49.8, M01.X0]   1. Acute renal failure with hyperkalemia on chronic kidney disease stage IV with proteinuria: baseline creatinine disease of 2.83, GFR of 25 on 10/18/17. Chronic kidney disease secondary to diabetes mellitus and hypertension.  Acute renal failure secondary to hypotension/sepsis - Veltassa for hyperkalemia - Hold bumex.  - Continue IV fluids.   2. Hypertension: elevated at 163/81. Home regimen of amlodipine, bumetanide, carvedilol.  - holding bumetanide due to renal failure  3. Diabetes mellitus type II with chronic kidney disease: insulin dependent. Not well controlled. Hemoglobin A1c of 11.2%.   4. Anemia of chronic kidney disease: normocytic. Hemoglobin 8.4.  - discussed ESA therapy with patient.   5. Secondary Hyperparathyroidism - Check PTH and phosphorus - Continue sevelamer.    LOS: 3 Stone Spirito 9/21/201912:04 PM

## 2017-12-19 NOTE — Progress Notes (Signed)
Pharmacy Antibiotic Note  Edwin [name] is a 47 y.o. male admitted on 12/16/2017 with osteomyelitis.  Pharmacy has been consulted for vancomycin  And cefepime dosing.  Plan: Vancomycin 1 gm IV x 1 in ED, will give another 1 gm IV x 1 to make a total of 2 gm load then start vancomycin 2 gm IV Q24H (although dosing will likely need to be empirically adjusted due to AKI [baseline SCr seems to be around 2.5 mg/dL but measuring greater than 3 mg/dL today]). Pharmacy will continue to follow and adjust as needed to maintain trough 15 to 20 mcg/ml.  Vd 75.8 L, Ke 0.041 hr-1, T1/2 17 hr   9/21:  Scr has improved to 3.14. PK still appears to support q24h dosing.  Ke 0.032  t 1/2 21.66 Vd 76.7  Adj BW 109.6 kg. Patient on Bumex 2 mg daily.   -Cefepime 1 gm IV q24h (also on Metronidazole)   Height: 6\' 2"  (188 cm) Weight: (!) 332 lb 3.7 oz (150.7 kg) IBW/kg (Calculated) : 82.2  Temp (24hrs), Avg:97.9 F (36.6 C), Min:97.6 F (36.4 C), Max:98.2 F (36.8 C)  Recent Labs  Lab 12/16/17 1339 12/17/17 0257 12/18/17 0521 12/19/17 0519  WBC 10.6 8.1 11.8* 11.3*  CREATININE 3.14* 3.12* 3.57* 3.14*    Estimated Creatinine Clearance: 44.1 mL/min (A) (by C-G formula based on SCr of 3.14 mg/dL (H)).    Allergies  Allergen Reactions  . Morphine And Related     Antimicrobials this admission: Clinda 9/18 >> 9/18 Cefepime 9/18 >> 9/18 Vanco 9/18 >> Zosyn 9/18 >> 9/20 9/20 cefepime >>  Dose adjustments this admission:   Microbiology results:  BCx:   UCx:    Sputum:    MRSA PCR: negative 9/19 wound cx: NG 9/18 knee fluid cx: NG x 2 d  Thank you for allowing pharmacy to be a part of this patient's care.  Sevana Grandinetti A, Pharm.D., BCPS Clinical Pharmacist 12/19/2017 10:29 AM

## 2017-12-19 NOTE — Progress Notes (Signed)
[facility] for Warfarin  Indication: atrial fibrillation  Allergies  Allergen Reactions  . Morphine And Related     Patient Measurements: Height: 6\' 2"  (188 cm) Weight: (!) 332 lb 3.7 oz (150.7 kg) IBW/kg (Calculated) : 82.2 Heparin Dosing Weight:    Vital Signs: Temp: 98 F (36.7 C) (09/21 0736) Temp Source: Oral (09/21 0736) BP: 138/72 (09/21 0736) Pulse Rate: 83 (09/21 0736)  Labs: Recent Labs    12/17/17 0257 12/18/17 0521 12/19/17 0519  HGB 8.6* 8.8* 8.4*  HCT 26.0* 26.5* 25.8*  PLT 374 398 386  APTT 52*  --   --   LABPROT 20.7* 15.2 15.4*  INR 1.80 1.21 1.23  CREATININE 3.12* 3.57* 3.14*    Estimated Creatinine Clearance: 44.1 mL/min (A) (by C-G formula based on SCr of 3.14 mg/dL (H)).   Medical History: Past Medical History:  Diagnosis Date  . CAD (coronary artery disease)   . Diabetes mellitus without complication ([place])   . Heart attack ([place])   . Hyperlipidemia   . Hypertension   . Paroxysmal atrial fibrillation ([name])     Medications:  Scheduled:  . allopurinol  50 mg Oral QODAY  . amiodarone  200 mg Oral Daily  . amLODipine  10 mg Oral Daily  . atorvastatin  80 mg Oral QPM  . bumetanide  2 mg Oral Daily  . carvedilol  25 mg Oral Q breakfast  . carvedilol  50 mg Oral Q supper  . cholecalciferol  5,000 Units Oral Daily  . docusate sodium  100 mg Oral BID  . ezetimibe  10 mg Oral Daily  . famotidine  20 mg Oral Daily  . FLUoxetine  60 mg Oral Daily  . gabapentin  100 mg Oral TID  . insulin aspart  0-20 Units Subcutaneous TID AC & HS  . insulin aspart  15 Units Subcutaneous TID WC  . insulin detemir  40 Units Subcutaneous BID  . patiromer  16.8 g Oral Daily  . predniSONE  10 mg Oral 3 x daily with food  . [START ON 12/20/2017] predniSONE  10 mg Oral 4X daily taper  . predniSONE  20 mg Oral Nightly  . sevelamer carbonate  1,600 mg Oral TID WC  . ticagrelor  90 mg Oral BID  . traMADol  50 mg Oral Q6H   . warfarin  12 mg Oral Once per day on Sun Tue Thu  . warfarin  15 mg Oral Once per day on Mon Wed Fri Sat  . Warfarin - Pharmacist Dosing Inpatient   Does not apply q1800   Infusions:  . sodium chloride 100 mL/hr at 12/19/17 0327  . ceFEPime (MAXIPIME) IV    . ceFEPime (MAXIPIME) IV Stopped (12/18/17 1823)  . methocarbamol (ROBAXIN) IV    . metronidazole 500 mg (12/19/17 0832)  . vancomycin Stopped (12/18/17 1041)    Assessment: 47 yo M on Warfarin PTA for Afib. Warfarin had been held for surgery, now restarting. Patient received Vit K 10 mg IV x 1 today 12/17/17. Home warfarin (per Chart review encounter note from 12/04/17):  Warfarin 12 mg Tues,Thur, Sun Warfarin 15 mg Mon, Wed, Fri, Sat  9/19  INR 1.80  12 mg 9/20  INR 1.21  15 mg 9/21  INR 1.23   Goal of Therapy:  INR 2-3 Monitor platelets by anticoagulation protocol: Yes   Plan:   Patient on Amiodarone (was on it PTA also), Brilinta, Zosyn. F/u INR daily.  Watch for  delay in therapeutic INR from Vit K. Due to administration of Vit K expected delay with INR change, will continue with Warfarin 15 mg dose again tonight.  Anvay Tennis A, PharmD 12/19/2017,10:39 AM

## 2017-12-19 NOTE — Evaluation (Signed)
Occupational Therapy Evaluation Patient Details Name: Edwin [name] MRN: [id number] DOB: 27-Jan-1971 Today's Date: 12/19/2017    History of Present Illness Pt is a 47 y.o. male presenting to hospital 12/16/17 with L knee pain x1 month s/p mechanical fall going up stairs (daughter's dog knocked him down); s/p L knee aspiration 12/16/17.  CT scan showing large joint effusion containing gas.  Pt admitted with hyperkalemia, CKD stage IV, paroxysmal a-fib, and found to have gout.  Pt s/p L knee arthroscopic debridement and synovectomy, chondroplasty of medial femoral condyle 9/18.  PMH includes L drop foot and decreased sensation L foot, h/o MI, s/p stent placement Dec 2018, DM, htn, cardiac surgery, joint replacement, CKD, pacemaker   Clinical Impression   Pt in bed and willing to work with OT- report he had to teach himself a lot of stuff after his MI and foot drop - do have something to help him put his socks on - that somebody made for him - pt was ed on using AE for LB dressing and donning and doffing clothing and ankle brace in shoe on L - pt CG with functional mobility to bathroom and no LOB - but need some v/c for hand placement  And toilet transfer- discuss with pt safety at home - Pt can benefit from OT services to increase independence in ADL's using AE and energy conservation     Follow Up Recommendations  No OT follow up when discharge    Equipment Recommendations  3 in 1 bedside commode    Recommendations for Other Services PT consult for [facility]     Precautions / Restrictions        Mobility Bed Mobility supervision                  Transfers - CG using RW                      Balance   No LOB                                          ADL either performed or assessed with clinical judgement   ADL    UB dressing setup and supervision LB dressing min A  Bathing min A  Grooming and eating setup  toiletting - supervision                                             Vision         Perception     Praxis      Pertinent Vitals/Pain Pain Assessment: No/denies pain Pain Location: L knee Pain Descriptors / Indicators: Sore     Hand Dominance     Extremity/Trunk Assessment Upper Extremity Assessment Upper Extremity Assessment: (bilateral UE WNL for ROM and strength )           Communication     Cognition Arousal/Alertness: Awake/alert Behavior During Therapy: WFL for tasks assessed/performed Overall Cognitive Status: Within Functional Limits for tasks assessed                                     General Comments       Exercises  Shoulder Instructions      Home Living Family/patient expects to be discharged to:: Private residence Living Arrangements: Spouse/significant other Available Help at Discharge: Family Type of Home: House       Home Layout: Two level;Able to live on main level with bedroom/bathroom     Bathroom Shower/Tub: Walk-in shower(will wash up at sink first week or 2)         Home Equipment: Walker - 4 wheels;Cane - quad;Cane - single point;Wheelchair - manual;Shower seat          Prior Functioning/Environment                   OT Problem List: Impaired balance (sitting and/or standing);Decreased knowledge of use of DME or AE      OT Treatment/Interventions: Self-care/ADL training;DME and/or AE instruction;Patient/family education    OT Goals(Current goals can be found in the care plan section) Acute Rehab OT Goals Patient Stated Goal: to go home OT Goal Formulation: With patient Time For Goal Achievement: 12/26/17 Potential to Achieve Goals: Good  OT Frequency: Min 1X/week   Barriers to D/C:            Co-evaluation        OT GOALS: 1. Pt to be ind in using AE for LB dressing  1 wks 2. Pt to verbalize understanding for energy conservation and how to apply at least 2-3 into his ADL's or IADL's  1 wks       AM-PAC  PT "6 Clicks" Daily Activity     Outcome Measure                 End of Session Equipment Utilized During Treatment: Gait belt  Activity Tolerance: Patient tolerated treatment well;No increased pain;Patient limited by fatigue Patient left: in chair;with chair alarm set  OT Visit Diagnosis: Muscle weakness (generalized) (M62.81);History of falling (Z91.81)                Time: 2637-8588 OT Time Calculation (min): 37 min Charges:  OT Evaluation $OT Eval Low Complexity: 1 Low OT Treatments $Self Care/Home Management : 8-22 mins    Naamah Boggess OTR/L,CLT 12/19/2017, 10:38 AM

## 2017-12-19 NOTE — Plan of Care (Signed)
  Problem: Education: Goal: Knowledge of General Education information will improve Description: Including pain rating scale, medication(s)/side effects and non-pharmacologic comfort measures Outcome: Progressing   Problem: Clinical Measurements: Goal: Ability to maintain clinical measurements within normal limits will improve Outcome: Progressing Goal: Will remain free from infection Outcome: Progressing   Problem: Activity: Goal: Risk for activity intolerance will decrease Outcome: Progressing   Problem: Nutrition: Goal: Adequate nutrition will be maintained Outcome: Progressing   Problem: Pain Managment: Goal: General experience of comfort will improve Outcome: Progressing   Problem: Safety: Goal: Ability to remain free from injury will improve Outcome: Progressing   Problem: Skin Integrity: Goal: Risk for impaired skin integrity will decrease Outcome: Progressing   

## 2017-12-19 NOTE — Progress Notes (Signed)
Pts BG 541. [facility] notified. Per MD give scheduled insulin  (Novolog and Levemir). Insulin given.

## 2017-12-19 NOTE — Progress Notes (Signed)
CRITICAL VALUE ALERT  Critical value received:  Glucose = 525  Date of notification:  12/19/17  Time of notification:  0620  Critical value read back:Yes.    Nurse who received alert:  Maretta Bees., RN  MD notified (1st page):  Dr. Marcille Blanco  Time of first page:  0630  MD notified (2nd page):  Time of second page:  Responding MD:  Dr. Marcille Blanco, no order/s made.  Time MD responded:  [phone]

## 2017-12-19 NOTE — Progress Notes (Addendum)
Inpatient Diabetes Program Recommendations  AACE/ADA: New Consensus Statement on Inpatient Glycemic Control (2019)  Target Ranges:  Prepandial:   less than 140 mg/dL      Peak postprandial:   less than 180 mg/dL (1-2 hours)      Critically ill patients:  140 - 180 mg/dL   Results for FORD, PEDDIE (MRN [id number]) as of 12/19/2017 08:00  Ref. Range 12/18/2017 04:04 12/18/2017 08:00 12/18/2017 11:08 12/18/2017 12:06 12/18/2017 14:11 12/18/2017 15:24 12/18/2017 16:45 12/18/2017 20:59 12/19/2017 07:37  Glucose-Capillary Latest Ref Range: 70 - 99 mg/dL 406 (H) 374 (H) 403 (H) 407 (H) 336 (H) 205 (H) 184 (H) 202 (H) 541 (HH)   Review of Glycemic Control  Diabetes history:DM2  Outpatient Diabetes medications: NPH 14 units BID  Current orders for Inpatient glycemic control: Levemir 40 units BID, Novolog 0-20 units AC&HS, Novolog 15 units TID with meals   Inpatient Diabetes Program Recommendations:   Insulin-Correction: Please consider changing frequency of CBGs and Novolog 0-20 to Q4H for closer monitoring and correction.   HgbA1C: A1C 12% on 12/17/17 indicating an average glucose of 298 mg/dl over the past 2-3 months. Patient will likely need outpatient DM medications adjustments to improve DM control and follow up with PCP or Endocrinologist regarding DM control.  NOTE: Patient received Decadron 5 mg x 1 on 12/17/17. Noted patient was started on Prednisone dose pack on 12/18/17 for gout. Anticipate steroids contributing to hyperglycemia.  Patient is scheduled to get Levemir 40 units BID today (received 30 units and then 40 units on 12/18/17) and meal coverage was increased yesterday evening. Would recommend changing frequency of CBGs and Novolog 0-20 units to Q4H for more frequent monitoring and correction. If glucose continues to be uncontrolled on steroids, [place] want to consider ordering IV insulin drip to determine actual insulin needs and get glucose controlled safely. If so, patient will need to transfer  to ICU/Stepdown for IV insulin.  Thanks, Barnie Alderman, RN, MSN, CDE Diabetes Coordinator Inpatient Diabetes Program [phone] (Team Pager from 8am to 5pm)

## 2017-12-19 NOTE — Progress Notes (Signed)
[facility] [place] NAME: Edwin [name]    MR#:  [id number]  DATE OF BIRTH:  [birth year]  SUBJECTIVE:   Blood sugars are still somewhat uncontrolled, left knee pain significantly improved and worked with physical therapy and ambulate and well on it.  Will DC IV antibiotics as patient remains afebrile and  fluid cultures from the knee are negative.  REVIEW OF SYSTEMS:    Review of Systems  Constitutional: Negative for chills and fever.  HENT: Negative for congestion and tinnitus.   Eyes: Negative for blurred vision and double vision.  Respiratory: Negative for cough, shortness of breath and wheezing.   Cardiovascular: Negative for chest pain, orthopnea and PND.  Gastrointestinal: Negative for abdominal pain, diarrhea, nausea and vomiting.  Genitourinary: Negative for dysuria and hematuria.  Musculoskeletal: Positive for joint pain (Left knee).  Neurological: Negative for dizziness, sensory change and focal weakness.  All other systems reviewed and are negative.   Nutrition: Carb control Tolerating Diet: Yes Tolerating PT: Eval noted.   DRUG ALLERGIES:   Allergies  Allergen Reactions  . Morphine And Related     VITALS:  Blood pressure (!) 163/81, pulse 70, temperature 97.7 F (36.5 C), temperature source Oral, resp. rate 20, height 6\' 2"  (1.88 m), weight (!) 150.7 kg, SpO2 99 %.  PHYSICAL EXAMINATION:   Physical Exam  GENERAL:  47 y.o.-year-old obese patient lying in bed in no acute distress.  EYES: Pupils equal, round, reactive to light and accommodation. No scleral icterus. Extraocular muscles intact.  HEENT: Head atraumatic, normocephalic. Oropharynx and nasopharynx clear.  NECK:  Supple, no jugular venous distention. No thyroid enlargement, no tenderness.  LUNGS: Normal breath sounds bilaterally, no wheezing, rales, rhonchi. No use of accessory muscles of respiration.  CARDIOVASCULAR: S1, S2 normal. No murmurs, rubs, or gallops.   ABDOMEN: Soft, nontender, nondistended. Bowel sounds present. No organomegaly or mass.  EXTREMITIES: No cyanosis, clubbing or edema b/l.  Left knee swelling with dressing in place from recent surgery.    NEUROLOGIC: Cranial nerves II through XII are intact. No focal Motor or sensory deficits b/l.   PSYCHIATRIC: The patient is alert and oriented x 3.  SKIN: No obvious rash, lesion, or ulcer.    LABORATORY PANEL:   CBC Recent Labs  Lab 12/19/17 0519  WBC 11.3*  HGB 8.4*  HCT 25.8*  PLT 386   ------------------------------------------------------------------------------------------------------------------  Chemistries  Recent Labs  Lab 12/16/17 1339 12/17/17 0257  12/19/17 0519  NA 132* 134*   < > 132*  K 5.2* 5.1   < > 5.5*  CL 101 102   < > 100  CO2 21* 25   < > 22  GLUCOSE 325* 380*   < > 525*  BUN 44* 42*   < > 55*  CREATININE 3.14* 3.12*   < > 3.14*  CALCIUM 9.1 8.7*   < > 8.7*  AST 10*  9* 9*  --   --   ALT 11  9 8   --   --   ALKPHOS 91  93  --   --   --   BILITOT 0.3  0.3  --   --   --    < > = values in this interval not displayed.   ------------------------------------------------------------------------------------------------------------------  Cardiac Enzymes No results for input(s): TROPONINI in the last 168 hours. ------------------------------------------------------------------------------------------------------------------  RADIOLOGY:  US Renal  Result Date: 12/18/2017 CLINICAL DATA:  Chronic kidney disease. EXAM: RENAL / URINARY TRACT ULTRASOUND  COMPLETE COMPARISON:  None. FINDINGS: Right Kidney: Length: 15.5 cm. Echogenicity within normal limits. No mass or hydronephrosis visualized. Left Kidney: Length: 13.3 cm. 1.8 cm partially exophytic simple cyst is seen in midpole. Echogenicity within normal limits. No mass or hydronephrosis visualized. Bladder: Appears normal for degree of bladder distention. IMPRESSION: No significant renal abnormality  is noted. Electronically Signed   By: Marijo Conception, M.D.   On: 12/18/2017 09:20     ASSESSMENT AND PLAN:   47 year old male with past medical history of coronary artery disease status post stent placement, previous history of MI, ischemic cardiomyopathy status post AICD, paroxysmal atrial fibrillation, diabetes, chronic kidney disease stage IV who presents to the hospital due to left knee pain.  1. Left knee pain/swelling -initially suspected to be secondary to septic arthritis.  Patient underwent arthrocentesis in the ER but the cultures from the wound are negative.  Patient's fluid analysis was positive for crystals and therefore this is likely secondary to acute gout. - Patient already seen by orthopedics and underwent intraoperative washout of the left knee. -Also seen by rheumatology and started on low-dose allopurinol.  Patient was also placed on oral prednisone taper but I will discontinue that now due to his significantly elevated blood sugars. -We will DC the patient's antibiotics at this is likely gouty arthritis instead of septic arthritis.  2.  Chronic kidney disease stage IV-secondary to underlying diabetes. -Patient's baseline creatinine is around 2.8 and is currently close to baseline. - Patient has mild hyperkalemia and continue V[place] -Seen by nephrology and continue current care.  Hold Bumex for now.  3.  Diabetes type 2 uncontrolled with severe hyperglycemia- patient's blood sugars are significantly elevated with over 400-500.  Appreciate diabetes coordinator input. - I will DC patient's oral steroids, advance patient's Lantus dose and also increase the patient's NovoLog with meals.  Continue sliding scale insulin.  Follow blood sugars.  Patient's hemoglobin A1c was 11.  4.  Essential hypertension-continue carvedilol, Norvasc  5.  Paroxysmal atrial fibrillation-rate controlled.  Continue metoprolol, continue Coumadin.  6.  History of coronary artery disease status  post stent placement in the past.-  No acute chest pain. -Continue aspirin, Brilinta, carvedilol, atorvastatin.  7.  Depression-continue Prozac.  Possible discharge tomorrow if blood sugars are better controlled.   All the records are reviewed and case discussed with Care Management/Social Worker. Management plans discussed with the patient, family and they are in agreement.  CODE STATUS: Full code  DVT Prophylaxis: Warfarin  TOTAL TIME TAKING CARE OF THIS PATIENT: 30 minutes.   POSSIBLE D/C IN 1-2 DAYS, DEPENDING ON CLINICAL CONDITION.   Henreitta Leber M.D on 12/19/2017 at 1:43 PM  Between 7am to 6pm - Pager - [phone]  After 6pm go to [email or web address] - Proofreader  [facility]  Office  [phone]  CC: Primary care physician; Patient, No Pcp Per

## 2017-12-20 LAB — RENAL FUNCTION PANEL
ANION GAP: 10 (ref 5–15)
Albumin: 2.6 g/dL — ABNORMAL LOW (ref 3.5–5.0)
BUN: 53 mg/dL — ABNORMAL HIGH (ref 6–20)
CHLORIDE: 102 mmol/L (ref 98–111)
CO2: 24 mmol/L (ref 22–32)
CREATININE: 2.92 mg/dL — AB (ref 0.61–1.24)
Calcium: 8.8 mg/dL — ABNORMAL LOW (ref 8.9–10.3)
GFR, EST AFRICAN AMERICAN: 27 mL/min — AB (ref >60)
GFR, EST NON AFRICAN AMERICAN: 24 mL/min — AB (ref >60)
Glucose, Bld: 128 mg/dL — ABNORMAL HIGH (ref 70–99)
POTASSIUM: 4.6 mmol/L (ref 3.5–5.1)
Phosphorus: 3.5 mg/dL (ref 2.5–4.6)
Sodium: 136 mmol/L (ref 135–145)

## 2017-12-20 LAB — CBC
HEMATOCRIT: 27.3 % — AB (ref 40.0–52.0)
Hemoglobin: 9.2 g/dL — ABNORMAL LOW (ref 13.0–18.0)
MCH: 26.9 pg (ref 26.0–34.0)
MCHC: 33.5 g/dL (ref 32.0–36.0)
MCV: 80.3 fL (ref 80.0–100.0)
Platelets: 428 10*3/uL (ref 150–440)
RBC: 3.4 MIL/uL — ABNORMAL LOW (ref 4.40–5.90)
RDW: 16.6 % — ABNORMAL HIGH (ref 11.5–14.5)
WBC: 13 10*3/uL — ABNORMAL HIGH (ref 3.8–10.6)

## 2017-12-20 LAB — BODY FLUID CULTURE
Culture: NO GROWTH
Special Requests: NORMAL

## 2017-12-20 LAB — GLUCOSE, CAPILLARY
GLUCOSE-CAPILLARY: 146 mg/dL — AB (ref 70–99)
Glucose-Capillary: 140 mg/dL — ABNORMAL HIGH (ref 70–99)
Glucose-Capillary: 106 mg/dL — ABNORMAL HIGH (ref 70–99)

## 2017-12-20 LAB — PROTIME-INR
INR: 1.5
Prothrombin Time: 18 seconds — ABNORMAL HIGH (ref 11.4–15.2)

## 2017-12-20 LAB — VANCOMYCIN, TROUGH: VANCOMYCIN TR: 34 ug/mL — AB (ref 15–20)

## 2017-12-20 MED ORDER — ALLOPURINOL 100 MG TABLET
ORAL_TABLET | ORAL | 1 refills | 0 days
Start: 2017-12-20 — End: 2018-02-01

## 2017-12-20 MED ORDER — INSULIN DETEMIR (U-100) 100 UNIT/ML (3 ML) SUBCUTANEOUS PEN
Freq: Two times a day (BID) | SUBCUTANEOUS | 11 refills | 0 days
Start: 2017-12-20 — End: 2018-03-18

## 2017-12-20 MED ORDER — TRAMADOL 50 MG TABLET
ORAL_TABLET | Freq: Four times a day (QID) | ORAL | 0 refills | 0 days | Status: SS | PRN
Start: 2017-12-20 — End: 2018-09-22

## 2017-12-20 MED ORDER — INSULIN ASPART (U-100) 100 UNIT/ML (3 ML) SUBCUTANEOUS PEN
Freq: Three times a day (TID) | SUBCUTANEOUS | 11 refills | 0 days
Start: 2017-12-20 — End: 2018-03-18

## 2017-12-20 MED ORDER — WARFARIN SODIUM 7.5 MG PO TABS
15.0000 mg | ORAL_TABLET | Freq: Once | ORAL | Status: DC
  Filled 2017-12-20: qty 2

## 2017-12-20 MED ORDER — ALLOPURINOL 100 MG PO TABS: 50.0000 mg | ORAL_TABLET | ORAL | 1 refills | Status: DC

## 2017-12-20 MED ORDER — TRAMADOL HCL 50 MG PO TABS: 50.0000 mg | ORAL_TABLET | Freq: Four times a day (QID) | ORAL | 0 refills | Status: DC | PRN

## 2017-12-20 MED ORDER — INSULIN ASPART 100 UNIT/ML ~~LOC~~ SOLN: 15.0000 [IU] | Freq: Three times a day (TID) | SUBCUTANEOUS | 11 refills | Status: DC

## 2017-12-20 MED ORDER — INSULIN DETEMIR 100 UNIT/ML ~~LOC~~ SOLN
40.0000 [IU] | Freq: Two times a day (BID) | SUBCUTANEOUS | Status: DC
  Administered 2017-12-20: 40 [IU] via SUBCUTANEOUS
  Filled 2017-12-20 (×3): qty 0.4

## 2017-12-20 MED ORDER — WARFARIN SODIUM 6 MG PO TABS: 12.0000 mg | ORAL_TABLET | ORAL | Status: DC

## 2017-12-20 MED ORDER — INSULIN DETEMIR 100 UNIT/ML ~~LOC~~ SOLN: 40.0000 [IU] | Freq: Two times a day (BID) | SUBCUTANEOUS | 11 refills | Status: DC

## 2017-12-20 MED ORDER — PATIROMER SORBITEX CALCIUM 8.4 G PO PACK: 16.8000 g | PACK | Freq: Every day | ORAL | 1 refills | Status: AC

## 2017-12-20 NOTE — Progress Notes (Signed)
[facility] for Warfarin  Indication: atrial fibrillation  Allergies  Allergen Reactions  . Morphine And Related     Patient Measurements: Height: 6\' 2"  (188 cm) Weight: (!) 342 lb 1.6 oz (155.2 kg) IBW/kg (Calculated) : 82.2 Heparin Dosing Weight:    Vital Signs: Temp: 97.7 F (36.5 C) (09/22 0743) Temp Source: Oral (09/22 0743) BP: 157/86 (09/22 0743) Pulse Rate: 69 (09/22 0743)  Labs: Recent Labs    12/18/17 0521 12/19/17 0519 12/20/17 0834 12/20/17 0840  HGB 8.8* 8.4*  --  9.2*  HCT 26.5* 25.8*  --  27.3*  PLT 398 386  --  428  LABPROT 15.2 15.4*  --  18.0*  INR 1.21 1.23  --  1.50  CREATININE 3.57* 3.14* 2.92*  --     Estimated Creatinine Clearance: 48.2 mL/min (A) (by C-G formula based on SCr of 2.92 mg/dL (H)).   Medical History: Past Medical History:  Diagnosis Date  . CAD (coronary artery disease)   . Diabetes mellitus without complication ([place])   . Heart attack ([place])   . Hyperlipidemia   . Hypertension   . Paroxysmal atrial fibrillation ([name])     Medications:  Scheduled:  . allopurinol  50 mg Oral QODAY  . amiodarone  200 mg Oral Daily  . amLODipine  10 mg Oral Daily  . atorvastatin  80 mg Oral QPM  . carvedilol  25 mg Oral Q breakfast  . carvedilol  50 mg Oral Q supper  . cholecalciferol  5,000 Units Oral Daily  . docusate sodium  100 mg Oral BID  . ezetimibe  10 mg Oral Daily  . famotidine  20 mg Oral Daily  . FLUoxetine  60 mg Oral Daily  . gabapentin  100 mg Oral TID  . insulin aspart  0-20 Units Subcutaneous TID AC & HS  . insulin aspart  25 Units Subcutaneous TID WC  . insulin detemir  40 Units Subcutaneous BID  . patiromer  16.8 g Oral Daily  . sevelamer carbonate  1,600 mg Oral TID WC  . ticagrelor  90 mg Oral BID  . traMADol  50 mg Oral Q6H  . warfarin  12 mg Oral Once per day on Sun Tue Thu  . warfarin  15 mg Oral Once per day on Mon Wed Fri Sat  . Warfarin - Pharmacist Dosing Inpatient    Does not apply q1800   Infusions:  . ceFEPime (MAXIPIME) IV    . methocarbamol (ROBAXIN) IV      Assessment: 46 yo M on Warfarin PTA for Afib. Warfarin had been held for surgery, now restarting. Patient received Vit K 10 mg IV x 1 today 12/17/17. Home warfarin (per Chart review encounter note from 12/04/17):  Warfarin 12 mg Tues,Thur, Sun Warfarin 15 mg Mon, Wed, Fri, Sat  9/19  INR 1.80  12 mg 9/20  INR 1.21  15 mg 9/21  INR 1.23   15 mg 9/22  INR 1.50   Goal of Therapy:  INR 2-3 Monitor platelets by anticoagulation protocol: Yes   Plan:   Will give Warfarin 15 mg x 1 again tonight then continue home regimen. Patient on Amiodarone (was on it PTA also), Brilinta, Zosyn. F/u INR daily.   Watch for delay in therapeutic INR from Vit K. Due to administration of Vit K expected delay with INR change   Omair Dettmer A, PharmD 12/20/2017,9:25 AM

## 2017-12-20 NOTE — Discharge Summary (Signed)
[facility] NAME: Edwin [name]    MR#:  [id number]  DATE OF BIRTH:  [birth year]  DATE OF ADMISSION:  12/16/2017 ADMITTING PHYSICIAN: Thornton Park, MD  DATE OF DISCHARGE: 12/20/2017  PRIMARY CARE PHYSICIAN: Patient, No Pcp Per    ADMISSION DIAGNOSIS:  Arthritis due to other bacteria, septic arthritis of unspecified location ([place]) [A49.8, M01.X0]  DISCHARGE DIAGNOSIS:  Active Problems:   Infection of left knee ([place])   SECONDARY DIAGNOSIS:   Past Medical History:  Diagnosis Date  . CAD (coronary artery disease)   . Diabetes mellitus without complication ([place])   . Heart attack ([place])   . Hyperlipidemia   . Hypertension   . Paroxysmal atrial fibrillation [facility])     HOSPITAL COURSE:   47 year old male with past medical history of coronary artery disease status post stent placement, previous history of MI, ischemic cardiomyopathy status post AICD, paroxysmal atrial fibrillation, diabetes, chronic kidney disease stage IV who presents to the hospital due to left knee pain.  1. Left knee pain/swelling -initially suspected to be secondary to septic arthritis.  Patient underwent arthrocentesis in the ER but the cultures from the wound were negative.  Patient's fluid analysis was positive for crystals and therefore this was likely secondary to acute gout. - Patient already seen by orthopedics and underwent intraoperative washout of the left knee. -Also seen by rheumatology and started on low-dose allopurinol.    Patient was also placed on some oral steroids but those have been discontinued due to severe hyperglycemia.  Patient seen by physical therapy post washout and is able to bear weight and walking well.  Patient will be discharged home with home health PT, OT.  2.  Chronic kidney disease stage IV-secondary to underlying diabetes. -Patient's baseline creatinine is around 2.8 and is currently close to baseline. - Patient has mild  hyperkalemia and is being discharged on Veltassa -Seen by nephrology and they will follow up the patient as outpatient.   3.  Diabetes type 2 uncontrolled with severe hyperglycemia- hemoglobin A1c was as high as 11.  His blood sugars remain uncontrolled but that was secondary to patient receiving some oral steroids. -Patient's diabetic regimen has been changed from NPH to Levemir twice daily along with NovoLog with meals.  He will follow-up with his primary care physician for further changes to his diabetic regimen.  Patient is being arranged for home health nursing to manage his diabetes.  4.  Essential hypertension- pt. Will continue carvedilol, Norvasc  5.  Paroxysmal atrial fibrillation- this remained rate controlled.  pt. Will Continue metoprolol and continue Coumadin.  6.  History of coronary artery disease status post stent placement in the past.-  No acute chest pain. -Continue aspirin, Brilinta, carvedilol, atorvastatin.  7.  Depression- pt. Will continue Prozac.  Pt. Is being discharged with Home Health PT, OT, RN.   DISCHARGE CONDITIONS:   Stable.   CONSULTS OBTAINED:  Treatment Team:  Tsosie Billing, MD Emmaline Kluver., MD Lavonia Dana, MD  DRUG ALLERGIES:   Allergies  Allergen Reactions  . Morphine And Related     DISCHARGE MEDICATIONS:   Allergies as of 12/20/2017      Reactions   Morphine And Related       Medication List    STOP taking these medications   HUMULIN N KWIKPEN 100 UNIT/ML Kiwkpen Generic drug:  Insulin NPH (Human) ([place])     TAKE these medications   allopurinol 100 MG tablet  Commonly known as:  ZYLOPRIM Take 0.5 tablets (50 mg total) by mouth every other day. Start taking on:  12/21/2017   amiodarone 200 MG tablet Commonly known as:  PACERONE Take 200 mg by mouth daily.   amLODipine 10 MG tablet Commonly known as:  NORVASC Take 10 mg by mouth daily.   atorvastatin 80 MG tablet Commonly known as:   LIPITOR Take 80 mg by mouth every evening.   BRILINTA 90 MG Tabs tablet Generic drug:  ticagrelor Take 90 mg by mouth 2 (two) times daily.   bumetanide 2 MG tablet Commonly known as:  BUMEX Take 2 mg by mouth daily.   carvedilol 25 MG tablet Commonly known as:  COREG See admin instructions. Take 1 tablet (25MG ) by mouth every morning and 2 tablets (50MG ) by mouth every night   cholecalciferol 1000 units tablet Commonly known as:  VITAMIN D Take 5,000 Units by mouth daily.   ezetimibe 10 MG tablet Commonly known as:  ZETIA Take 10 mg by mouth daily.   famotidine 20 MG tablet Commonly known as:  PEPCID Take 20 mg by mouth daily.   FLUoxetine 20 MG capsule Commonly known as:  PROZAC Take 60 mg by mouth daily.   insulin aspart 100 UNIT/ML injection Commonly known as:  novoLOG Inject 15 Units into the skin 3 (three) times daily with meals.   insulin detemir 100 UNIT/ML injection Commonly known as:  LEVEMIR Inject 0.4 mLs (40 Units total) into the skin 2 (two) times daily.   patiromer 8.4 g packet Commonly known as:  VELTASSA Take 2 packets (16.8 g total) by mouth daily.   RENVELA 800 MG tablet Generic drug:  sevelamer carbonate Take 1,600 mg by mouth 3 (three) times daily with meals.   traMADol 50 MG tablet Commonly known as:  ULTRAM Take 1 tablet (50 mg total) by mouth every 6 (six) hours as needed.   VOLTAREN 1 % Gel Generic drug:  diclofenac sodium Apply 2 g topically 3 (three) times daily.   warfarin 6 MG tablet Commonly known as:  COUMADIN See admin instructions. Take 2 tablets (15MG ) by mouth daily on Monday, Wednesday, Friday and Saturday and take 2 tablets (12MG ) by mouth daily on Tuesday, Thursday and Sunday         DISCHARGE INSTRUCTIONS:   DIET:  Cardiac diet and Diabetic diet  DISCHARGE CONDITION:  Stable  ACTIVITY:  Activity as tolerated  OXYGEN:  Home Oxygen: No.   Oxygen Delivery: room air  DISCHARGE LOCATION:  Home with Home  health PT, OT, RN.    If you experience worsening of your admission symptoms, develop shortness of breath, life threatening emergency, suicidal or homicidal thoughts you must seek medical attention immediately by calling 911 or calling your MD immediately  if symptoms less severe.  You Must read complete instructions/literature along with all the possible adverse reactions/side effects for all the Medicines you take and that have been prescribed to you. Take any new Medicines after you have completely understood and accpet all the possible adverse reactions/side effects.   Please note  You were cared for by a hospitalist during your hospital stay. If you have any questions about your discharge medications or the care you received while you were in the hospital after you are discharged, you can call the unit and asked to speak with the hospitalist on call if the hospitalist that took care of you is not available. Once you are discharged, your primary care physician will handle any  further medical issues. Please note that NO REFILLS for any discharge medications will be authorized once you are discharged, as it is imperative that you return to your primary care physician (or establish a relationship with a primary care physician if you do not have one) for your aftercare needs so that they can reassess your need for medications and monitor your lab values.     Today   No acute events overnight, blood sugars have significantly improved.  Patient denies any knee pain and is ablating well with the help of physical therapy.  VITAL SIGNS:  Blood pressure (!) 157/86, pulse 69, temperature 97.7 F (36.5 C), temperature source Oral, resp. rate 19, height 6\' 2"  (1.88 m), weight (!) 155.2 kg, SpO2 97 %.  I/O:    Intake/Output Summary (Last 24 hours) at 12/20/2017 1311 Last data filed at 12/20/2017 1206 Gross per 24 hour  Intake 240 ml  Output 3300 ml  Net -3060 ml    PHYSICAL EXAMINATION:    GENERAL:  47 y.o.-year-old obese patient lying in bed in no acute distress.  EYES: Pupils equal, round, reactive to light and accommodation. No scleral icterus. Extraocular muscles intact.  HEENT: Head atraumatic, normocephalic. Oropharynx and nasopharynx clear.  NECK:  Supple, no jugular venous distention. No thyroid enlargement, no tenderness.  LUNGS: Normal breath sounds bilaterally, no wheezing, rales, rhonchi. No use of accessory muscles of respiration.  CARDIOVASCULAR: S1, S2 normal. No murmurs, rubs, or gallops.  ABDOMEN: Soft, nontender, nondistended. Bowel sounds present. No organomegaly or mass.  EXTREMITIES: No cyanosis, clubbing or edema b/l.  Left knee swelling with dressing in place from recent surgery.    NEUROLOGIC: Cranial nerves II through XII are intact. No focal Motor or sensory deficits b/l.   PSYCHIATRIC: The patient is alert and oriented x 3.  SKIN: No obvious rash, lesion, or ulcer.   DATA REVIEW:   CBC Recent Labs  Lab 12/20/17 0840  WBC 13.0*  HGB 9.2*  HCT 27.3*  PLT 428    Chemistries  Recent Labs  Lab 12/16/17 1339 12/17/17 0257  12/20/17 0834  NA 132* 134*   < > 136  K 5.2* 5.1   < > 4.6  CL 101 102   < > 102  CO2 21* 25   < > 24  GLUCOSE 325* 380*   < > 128*  BUN 44* 42*   < > 53*  CREATININE 3.14* 3.12*   < > 2.92*  CALCIUM 9.1 8.7*   < > 8.8*  AST 10*  9* 9*  --   --   ALT 11  9 8   --   --   ALKPHOS 91  93  --   --   --   BILITOT 0.3  0.3  --   --   --    < > = values in this interval not displayed.    Cardiac Enzymes No results for input(s): TROPONINI in the last 168 hours.  Microbiology Results  Results for orders placed or performed during the hospital encounter of 12/16/17  Body fluid culture     Status: None   Collection Time: 12/16/17  5:47 PM  Result Value Ref Range Status   Specimen Description   Final    KNEE LEFT KNEE Performed at [facility], [address] [place], [place] [address]    Special  Requests   Final    Normal Performed at [facility], [address] [place], [place] [address]  Gram Stain   Final    ABUNDANT WBC PRESENT, PREDOMINANTLY PMN NO ORGANISMS SEEN    Culture   Final    NO GROWTH 3 DAYS Performed at [facility] [address] [place], [place] [address]    Report Status 12/20/2017 FINAL  Final  Surgical pcr screen     Status: None   Collection Time: 12/17/17 12:20 AM  Result Value Ref Range Status   MRSA, PCR NEGATIVE NEGATIVE Final   Staphylococcus aureus NEGATIVE NEGATIVE Final    Comment: (NOTE) The Xpert SA Assay (FDA approved for NASAL specimens in patients 73 years of age and older), is one component of a comprehensive surveillance program. It is not intended to diagnose infection nor to guide or monitor treatment. Performed at [facility], [address], [place], [place] [address]   Aerobic/Anaerobic Culture (surgical/deep wound)     Status: None (Preliminary result)   Collection Time: 12/17/17 12:18 PM  Result Value Ref Range Status   Specimen Description   Final    WOUND Performed at [facility], [address], [place], [place] [address]    Special Requests   Final    NONE Performed at [facility], [address] [place], [place] [address]    Gram Stain   Final    MODERATE WBC PRESENT, PREDOMINANTLY PMN NO ORGANISMS SEEN    Culture   Final    NO GROWTH 3 DAYS NO ANAEROBES ISOLATED; CULTURE IN PROGRESS FOR 5 DAYS Performed at [facility] [address] [place], [place] [address]    Report Status PENDING  Incomplete    RADIOLOGY:  No results found.    Management plans discussed with the patient, family and they are in agreement.  CODE STATUS:  Code Status History    Date Active Date Inactive Code Status Order ID Comments User Context   12/16/2017 2226 12/17/2017 1550 Full Code [id number]  Thornton Park, MD Inpatient    Advance Directive Documentation      Most Recent Value  Type of Advance Directive  Healthcare Power of Attorney  Pre-existing out of facility DNR order (yellow form or pink MOST form)  -  "MOST" Form in Place?  -      TOTAL TIME TAKING CARE OF THIS PATIENT: 40 minutes.    Henreitta Leber M.D on 12/20/2017 at 1:11 PM  Between 7am to 6pm - Pager - [phone]  After 6pm go to [email or web address] - Proofreader  [facility]  Office  [phone]  CC: Primary care physician; Patient, No Pcp Per

## 2017-12-20 NOTE — Progress Notes (Signed)
[facility]  ROUNDING NOTE   Subjective:   Patient laying in bed. No complaints.   Glucose better controlled.   Objective:  Vital signs in last 24 hours:  Temp:  [97.7 F (36.5 C)-98.4 F (36.9 C)] 97.7 F (36.5 C) (09/22 0743) Pulse Rate:  [69-76] 69 (09/22 0743) Resp:  [19] 19 (09/21 2320) BP: (134-163)/(59-86) 157/86 (09/22 0743) SpO2:  [97 %-100 %] 97 % (09/22 0743) Weight:  [155.2 kg] 155.2 kg (09/22 0443)  Weight change: 4.476 kg Filed Weights   12/17/17 1100 12/19/17 0500 12/20/17 0443  Weight: (!) 147.4 kg (!) 150.7 kg (!) 155.2 kg    Intake/Output: I/O last 3 completed shifts: In: 1472.9 [P.O.:720; I.V.:652.9; IV Piggyback:100] Out: 4650 [Urine:4650]   Intake/Output this shift:  Total I/O In: 240 [P.O.:240] Out: -   Physical Exam: General: NAD,   Head: Normocephalic, atraumatic. Moist oral mucosal membranes  Eyes: Anicteric, PERRL  Neck: Supple, trachea midline  Lungs:  Clear to auscultation  Heart: Regular rate and rhythm  Abdomen:  Soft, nontender,   Extremities:  no peripheral edema. Left knee in dressings  Neurologic: Nonfocal, moving all four extremities  Skin: No lesions        Basic Metabolic Panel: Recent Labs  Lab 12/16/17 1339 12/17/17 0257 12/18/17 0521 12/18/17 1234 12/18/17 1530 12/19/17 0519 12/20/17 0834  NA 132* 134* 132*  --   --  132* 136  K 5.2* 5.1 5.9* 5.1  --  5.5* 4.6  CL 101 102 100  --   --  100 102  CO2 21* 25 22  --   --  22 24  GLUCOSE 325* 380* 410*  --   --  525* 128*  BUN 44* 42* 54*  --   --  55* 53*  CREATININE 3.14* 3.12* 3.57*  --   --  3.14* 2.92*  CALCIUM 9.1 8.7* 8.3*  --   --  8.7* 8.8*  PHOS  --   --   --   --  4.0  --  3.5    Liver Function Tests: Recent Labs  Lab 12/16/17 1339 12/17/17 0257 12/20/17 0834  AST 10*  9* 9*  --   ALT 11  9 8   --   ALKPHOS 91  93  --   --   BILITOT 0.3  0.3  --   --   PROT 8.2*  8.4*  --   --   ALBUMIN 2.8*  2.9*  --  2.6*   No  results for input(s): LIPASE, AMYLASE in the last 168 hours. No results for input(s): AMMONIA in the last 168 hours.  CBC: Recent Labs  Lab 12/16/17 1339 12/17/17 0257 12/18/17 0521 12/19/17 0519 12/20/17 0840  WBC 10.6 8.1 11.8* 11.3* 13.0*  NEUTROABS 8.1*  --   --   --   --   HGB 10.1* 8.6* 8.8* 8.4* 9.2*  HCT 30.4* 26.0* 26.5* 25.8* 27.3*  MCV 80.6 80.8 81.9 81.7 80.3  PLT 421 374 398 386 428    Cardiac Enzymes: No results for input(s): CKTOTAL, CKMB, CKMBINDEX, TROPONINI in the last 168 hours.  BNP: Invalid input(s): POCBNP  CBG: Recent Labs  Lab 12/19/17 1148 12/19/17 1621 12/19/17 2108 12/20/17 0455 12/20/17 0745  GLUCAP 503* 377* 226* 140* 106*    Microbiology: Results for orders placed or performed during the hospital encounter of 12/16/17  Body fluid culture     Status: None (Preliminary result)   Collection Time: 12/16/17  5:47 PM  Result Value Ref Range Status   Specimen Description   Final    KNEE LEFT KNEE Performed at [facility], [address] [place], [place] [address]    Special Requests   Final    Normal Performed at [facility], [address] [place], [place] [address]    Gram Stain   Final    ABUNDANT WBC PRESENT, PREDOMINANTLY PMN NO ORGANISMS SEEN    Culture   Final    NO GROWTH 3 DAYS Performed at [facility], [place] [address], [place], [place] [address]    Report Status PENDING  Incomplete  Surgical pcr screen     Status: None   Collection Time: 12/17/17 12:20 AM  Result Value Ref Range Status   MRSA, PCR NEGATIVE NEGATIVE Final   Staphylococcus aureus NEGATIVE NEGATIVE Final    Comment: (NOTE) The Xpert SA Assay (FDA approved for NASAL specimens in patients 41 years of age and older), is one component of a comprehensive surveillance program. It is not intended to diagnose infection nor to guide or monitor treatment. Performed at [facility], [address], [place], [place]  [address]   Aerobic/Anaerobic Culture (surgical/deep wound)     Status: None (Preliminary result)   Collection Time: 12/17/17 12:18 PM  Result Value Ref Range Status   Specimen Description   Final    WOUND Performed at [facility], [address], [place], [place] [address]    Special Requests   Final    NONE Performed at [facility], [address] [place], [place] [address]    Gram Stain   Final    MODERATE WBC PRESENT, PREDOMINANTLY PMN NO ORGANISMS SEEN    Culture   Final    NO GROWTH 2 DAYS NO ANAEROBES ISOLATED; CULTURE IN PROGRESS FOR 5 DAYS Performed at [facility] [address] [place], [place] [address]    Report Status PENDING  Incomplete    Coagulation Studies: Recent Labs    12/18/17 0521 12/19/17 0519 12/20/17 0840  LABPROT 15.2 15.4* 18.0*  INR 1.21 1.23 1.50    Urinalysis: No results for input(s): COLORURINE, LABSPEC, PHURINE, GLUCOSEU, HGBUR, BILIRUBINUR, KETONESUR, PROTEINUR, UROBILINOGEN, NITRITE, LEUKOCYTESUR in the last 72 hours.  Invalid input(s): APPERANCEUR    Imaging: No results found.   Medications:   . ceFEPime (MAXIPIME) IV    . methocarbamol (ROBAXIN) IV     . allopurinol  50 mg Oral QODAY  . amiodarone  200 mg Oral Daily  . amLODipine  10 mg Oral Daily  . atorvastatin  80 mg Oral QPM  . carvedilol  25 mg Oral Q breakfast  . carvedilol  50 mg Oral Q supper  . cholecalciferol  5,000 Units Oral Daily  . docusate sodium  100 mg Oral BID  . ezetimibe  10 mg Oral Daily  . famotidine  20 mg Oral Daily  . FLUoxetine  60 mg Oral Daily  . gabapentin  100 mg Oral TID  . insulin aspart  0-20 Units Subcutaneous TID AC & HS  . insulin aspart  25 Units Subcutaneous TID WC  . insulin detemir  40 Units Subcutaneous BID  . patiromer  16.8 g Oral Daily  . sevelamer carbonate  1,600 mg Oral TID WC  . ticagrelor  90 mg Oral BID  . traMADol  50 mg Oral Q6H  . [START ON 12/22/2017] warfarin  12 mg Oral Once per day  on Sun Tue Thu  .  warfarin  15 mg Oral Once per day on Mon Wed Fri Sat  . warfarin  15 mg Oral ONCE-1800  . Warfarin - Pharmacist Dosing Inpatient   Does not apply q1800   acetaminophen, bisacodyl, diphenhydrAMINE, HYDROcodone-acetaminophen, HYDROcodone-acetaminophen, magnesium citrate, menthol-cetylpyridinium **OR** phenol, methocarbamol **OR** methocarbamol (ROBAXIN) IV, ondansetron **OR** ondansetron (ZOFRAN) IV, senna-docusate  Assessment/ Plan:  Mr. Codee Tutson Forero is a 47 y.o. white male with diabetes mellitus type II, hypertension, hyperlipidemia, COPD/asthma with tobacco use, obstructive sleep apnea, coronary artery disease, NASH, gout, TIA, atrial fibrillation who was admitted to [facility] on 12/16/2017 for Arthritis with left knee arthroscopy debridement on 9/19 Dr. Mack Guise  1. Acute renal failure with hyperkalemia on chronic kidney disease stage IV with proteinuria: baseline creatinine disease of 2.83, GFR of 25 on 10/18/17. Chronic kidney disease secondary to diabetes mellitus and hypertension.  Acute renal failure secondary to hypotension/sepsis - Veltassa for hyperkalemia - Hold bumex.   2. Hypertension: elevated at 157/86. Home regimen of amlodipine, bumetanide, carvedilol.  - holding bumetanide due to renal failure - Currently on amlodipine and carvedilol.   3. Diabetes mellitus type II with chronic kidney disease: insulin dependent. Not well controlled. Hemoglobin A1c of 11.2%.   4. Anemia of chronic kidney disease: normocytic. Hemoglobin 9.2 - discussed ESA therapy with patient.   5. Secondary Hyperparathyroidism. Phosphorus and calcium at goal. Pending PTH - Continue sevelamer.    LOS: 4 Rheanne Cortopassi 9/22/201910:26 AM

## 2017-12-20 NOTE — Care Management (Signed)
Patient for discharge home today.  Obtained orders to resume home health PT and OT.   Added home health nurse for glycemic monitoring.  Manually faxed referral and discharge summary and received confirmation

## 2017-12-20 NOTE — Progress Notes (Signed)
Discharge summary reviewed with verbal understanding. Rxs given upon discharge. Escorted to personal vehicle via wc.

## 2017-12-20 NOTE — Progress Notes (Signed)
Levemir dose reduced to 40units per MD order

## 2017-12-20 NOTE — Progress Notes (Signed)
Physical Therapy Treatment Patient Details Name: Edwin [name] MRN: [id number] DOB: 29-Mar-1971 Today's Date: 12/20/2017    History of Present Illness Pt is a 47 y.o. male presenting to hospital 12/16/17 with L knee pain x1 month s/p mechanical fall going up stairs (daughter's dog knocked him down); s/p L knee aspiration 12/16/17.  CT scan showing large joint effusion containing gas.  Pt admitted with hyperkalemia, CKD stage IV, paroxysmal a-fib, and found to have gout.  Pt s/p L knee arthroscopic debridement and synovectomy, chondroplasty of medial femoral condyle 9/18.  PMH includes L drop foot and decreased sensation L foot, h/o MI, s/p stent placement Dec 2018, DM, htn, cardiac surgery, joint replacement, CKD, pacemaker    PT Comments    Pt showed very good improvement with PT session today.  He was able to easily circumambulate the nurses station with a walker, showed safe and independent stair negotiation and has good quad control and generally did well with all exercises.  Mild pain in L knee as expected, but "much better than is was a few days ago."   Follow Up Recommendations  Home health PT;Supervision for mobility/OOB     Equipment Recommendations  Rolling walker with 5" wheels    Recommendations for Other Services       Precautions / Restrictions Precautions Precautions: Fall Restrictions LLE Weight Bearing: Weight bearing as tolerated    Mobility  Bed Mobility Overal bed mobility: Modified Independent                Transfers Overall transfer level: Modified independent Equipment used: Rolling walker (2 wheeled) Transfers: Sit to/from Stand Sit to Stand: Supervision         General transfer comment: Cues for hand placement and positioning, able to rise w/o assist  Ambulation/Gait Ambulation/Gait assistance: Modified independent (Device/Increase time) Gait Distance (Feet): 250 Feet Assistive device: Rolling walker (2 wheeled)       General Gait  Details: Pt with greatly improved confidence and execution of ambulation this date.  He was able to maintain consistent cadence, community appropriate speed and though he had some minimal fatigue his vitals were stable and appropriate the entire time.    Stairs Stairs: Yes Stairs assistance: Modified independent (Device/Increase time) Stair Management: One rail Right Number of Stairs: 4 General stair comments: Pt endorses that he was instructed ont he the "right way" to negotiate steps, but insists that leading with his L LE in better for him, and per today's performance he was safe and effective withthis strategy.   Wheelchair Mobility    Modified Rankin (Stroke Patients Only)       Balance Overall balance assessment: Modified Independent                                          Cognition Arousal/Alertness: Awake/alert Behavior During Therapy: WFL for tasks assessed/performed Overall Cognitive Status: Within Functional Limits for tasks assessed                                        Exercises General Exercises - Lower Extremity Quad Sets: Strengthening;10 reps Gluteal Sets: Strengthening;10 reps Short Arc Quad: Strengthening;10 reps Long Arc Quad: Strengthening;10 reps Heel Slides: Strengthening;10 reps Hip ABduction/ADduction: Strengthening;10 reps Straight Leg Raises: AROM;10 reps    General Comments  Pertinent Vitals/Pain Pain Assessment: 0-10 Pain Score: 4  Pain Location: L knee incision points    Home Living                      Prior Function            PT Goals (current goals can now be found in the care plan section) Progress towards PT goals: Progressing toward goals    Frequency    7X/week      PT Plan Current plan remains appropriate    Co-evaluation              AM-PAC PT "6 Clicks" Daily Activity  Outcome Measure  Difficulty turning over in bed (including adjusting bedclothes,  sheets and blankets)?: None Difficulty moving from lying on back to sitting on the side of the bed? : None Difficulty sitting down on and standing up from a chair with arms (e.g., wheelchair, bedside commode, etc,.)?: A Little Help needed moving to and from a bed to chair (including a wheelchair)?: None Help needed walking in hospital room?: None Help needed climbing 3-5 steps with a railing? : None 6 Click Score: 23    End of Session Equipment Utilized During Treatment: Gait belt Activity Tolerance: Patient tolerated treatment well Patient left: in chair;with call bell/phone within reach;with chair alarm set;with SCD's reapplied;Other (comment) Nurse Communication: Mobility status;Precautions;Weight bearing status;Other (comment) PT Visit Diagnosis: Other abnormalities of gait and mobility (R26.89);Muscle weakness (generalized) (M62.81);Difficulty in walking, not elsewhere classified (R26.2);Pain Pain - Right/Left: Left Pain - part of body: Knee     Time: 5726-2035 PT Time Calculation (min) (ACUTE ONLY): 27 min  Charges:  $Gait Training: 8-22 mins $Therapeutic Exercise: 8-22 mins                     Kreg Shropshire, DPT 12/20/2017, 12:54 PM

## 2017-12-21 MED FILL — AMIODARONE 200 MG TABLET: 90 days supply | Qty: 90 | Fill #0 | Status: AC

## 2017-12-21 MED FILL — HUMULIN N NPH U-100 INSULIN KWIKPEN 100 UNIT/ML (3 ML) SUBCUTANEOUS: 50 days supply | Qty: 15 | Fill #0 | Status: AC

## 2017-12-21 MED FILL — FAMOTIDINE 20 MG TABLET: 90 days supply | Qty: 90 | Fill #0 | Status: AC

## 2017-12-21 MED FILL — BRILINTA 90 MG TABLET: 90 days supply | Qty: 180 | Fill #0 | Status: AC

## 2017-12-21 MED FILL — AMIODARONE 200 MG TABLET: ORAL | 90 days supply | Qty: 90 | Fill #0

## 2017-12-21 MED FILL — BRILINTA 90 MG TABLET: ORAL | 90 days supply | Qty: 180 | Fill #0

## 2017-12-21 MED FILL — BUMETANIDE 2 MG TABLET: 90 days supply | Qty: 90 | Fill #0 | Status: AC

## 2017-12-21 MED FILL — FAMOTIDINE 20 MG TABLET: ORAL | 90 days supply | Qty: 90 | Fill #0

## 2017-12-21 MED FILL — BUMETANIDE 2 MG TABLET: ORAL | 90 days supply | Qty: 90 | Fill #0

## 2017-12-22 LAB — PARATHYROID HORMONE, INTACT (NO CA): PTH: 22 pg/mL (ref 15–65)

## 2017-12-23 LAB — AEROBIC/ANAEROBIC CULTURE W GRAM STAIN (SURGICAL/DEEP WOUND): Culture: NO GROWTH

## 2017-12-23 LAB — AEROBIC/ANAEROBIC CULTURE (SURGICAL/DEEP WOUND)

## 2017-12-23 MED ORDER — PATIROMER CALCIUM SORBITEX 8.4 GRAM ORAL POWDER PACKET
PACK | Freq: Every day | ORAL | 0 refills | 0.00000 days | Status: SS
Start: 2017-12-23 — End: 2018-03-12

## 2017-12-23 MED FILL — ALLOPURINOL 100 MG TABLET: 32 days supply | Qty: 8 | Fill #0 | Status: AC

## 2017-12-23 MED FILL — TRAMADOL 50 MG TABLET: ORAL | 8 days supply | Qty: 30 | Fill #0

## 2017-12-23 MED FILL — ALLOPURINOL 100 MG TABLET: ORAL | 32 days supply | Qty: 8 | Fill #0

## 2017-12-23 MED FILL — NOVOLOG FLEXPEN U-100 INSULIN ASPART 100 UNIT/ML (3 ML) SUBCUTANEOUS: SUBCUTANEOUS | 34 days supply | Qty: 15 | Fill #0

## 2017-12-23 MED FILL — NOVOLOG FLEXPEN U-100 INSULIN ASPART 100 UNIT/ML (3 ML) SUBCUTANEOUS: 34 days supply | Qty: 15 | Fill #0 | Status: AC

## 2017-12-23 MED FILL — LEVEMIR FLEXTOUCH U-100 INSULIN 100 UNIT/ML (3 ML) SUBCUTANEOUS PEN: SUBCUTANEOUS | 19 days supply | Qty: 15 | Fill #0

## 2017-12-23 MED FILL — TRAMADOL 50 MG TABLET: 8 days supply | Qty: 30 | Fill #0 | Status: AC

## 2017-12-23 MED FILL — LEVEMIR FLEXTOUCH U-100 INSULIN 100 UNIT/ML (3 ML) SUBCUTANEOUS PEN: 19 days supply | Qty: 15 | Fill #0 | Status: AC

## 2018-01-14 ENCOUNTER — Ambulatory Visit: Admit: 2018-01-14 | Discharge: 2018-01-15 | Payer: MEDICARE

## 2018-01-14 DIAGNOSIS — R208 Other disturbances of skin sensation: Secondary | ICD-10-CM

## 2018-01-14 DIAGNOSIS — E1122 Type 2 diabetes mellitus with diabetic chronic kidney disease: Secondary | ICD-10-CM

## 2018-01-14 DIAGNOSIS — I10 Essential (primary) hypertension: Secondary | ICD-10-CM

## 2018-01-14 DIAGNOSIS — N184 Chronic kidney disease, stage 4 (severe): Secondary | ICD-10-CM

## 2018-01-14 DIAGNOSIS — G894 Chronic pain syndrome: Secondary | ICD-10-CM

## 2018-01-14 DIAGNOSIS — F3342 Major depressive disorder, recurrent, in full remission: Secondary | ICD-10-CM

## 2018-01-14 DIAGNOSIS — Z794 Long term (current) use of insulin: Secondary | ICD-10-CM

## 2018-01-14 DIAGNOSIS — I480 Paroxysmal atrial fibrillation: Secondary | ICD-10-CM

## 2018-01-14 DIAGNOSIS — Z09 Encounter for follow-up examination after completed treatment for conditions other than malignant neoplasm: Principal | ICD-10-CM

## 2018-01-14 DIAGNOSIS — M21372 Foot drop, left foot: Secondary | ICD-10-CM

## 2018-01-14 DIAGNOSIS — M150 Primary generalized (osteo)arthritis: Secondary | ICD-10-CM

## 2018-01-14 MED ORDER — GABAPENTIN 300 MG CAPSULE
ORAL_CAPSULE | 0 refills | 0 days | Status: SS
Start: 2018-01-14 — End: 2018-03-12

## 2018-01-14 MED FILL — GABAPENTIN 300 MG CAPSULE: 45 days supply | Qty: 90 | Fill #0 | Status: AC

## 2018-01-14 MED FILL — EZETIMIBE 10 MG TABLET: 90 days supply | Qty: 90 | Fill #0 | Status: AC

## 2018-01-14 MED FILL — RENVELA 800 MG TABLET: 90 days supply | Qty: 540 | Fill #0 | Status: AC

## 2018-01-14 MED FILL — EZETIMIBE 10 MG TABLET: ORAL | 90 days supply | Qty: 90 | Fill #0

## 2018-01-14 MED FILL — LEVEMIR FLEXTOUCH U-100 INSULIN 100 UNIT/ML (3 ML) SUBCUTANEOUS PEN: SUBCUTANEOUS | 19 days supply | Qty: 15 | Fill #1

## 2018-01-14 MED FILL — RENVELA 800 MG TABLET: 90 days supply | Qty: 540 | Fill #0

## 2018-01-14 MED FILL — AMLODIPINE 10 MG TABLET: ORAL | 30 days supply | Qty: 30 | Fill #1

## 2018-01-14 MED FILL — AMLODIPINE 10 MG TABLET: 30 days supply | Qty: 30 | Fill #1 | Status: AC

## 2018-01-14 MED FILL — GABAPENTIN 300 MG CAPSULE: 45 days supply | Qty: 90 | Fill #0

## 2018-01-14 MED FILL — CARVEDILOL 25 MG TABLET: 90 days supply | Qty: 270 | Fill #0

## 2018-01-14 MED FILL — LEVEMIR FLEXTOUCH U-100 INSULIN 100 UNIT/ML (3 ML) SUBCUTANEOUS PEN: 19 days supply | Qty: 15 | Fill #1 | Status: AC

## 2018-01-14 MED FILL — CARVEDILOL 25 MG TABLET: 90 days supply | Qty: 270 | Fill #0 | Status: AC

## 2018-01-26 ENCOUNTER — Institutional Professional Consult (permissible substitution): Admit: 2018-01-26 | Discharge: 2018-01-27 | Payer: MEDICARE

## 2018-01-26 DIAGNOSIS — Z4502 Encounter for adjustment and management of automatic implantable cardiac defibrillator: Principal | ICD-10-CM

## 2018-01-26 DIAGNOSIS — I5042 Chronic combined systolic (congestive) and diastolic (congestive) heart failure: Secondary | ICD-10-CM

## 2018-01-29 ENCOUNTER — Ambulatory Visit: Admit: 2018-01-29 | Discharge: 2018-01-30 | Payer: MEDICARE | Attending: Clinical | Primary: Clinical

## 2018-01-29 DIAGNOSIS — F4320 Adjustment disorder, unspecified: Principal | ICD-10-CM

## 2018-02-01 ENCOUNTER — Ambulatory Visit: Admit: 2018-02-01 | Discharge: 2018-02-02 | Payer: MEDICARE

## 2018-02-01 DIAGNOSIS — M1A39X1 Chronic gout due to renal impairment, multiple sites, with tophus (tophi): Secondary | ICD-10-CM

## 2018-02-01 DIAGNOSIS — I480 Paroxysmal atrial fibrillation: Principal | ICD-10-CM

## 2018-02-01 DIAGNOSIS — I824Y3 Acute embolism and thrombosis of unspecified deep veins of proximal lower extremity, bilateral: Secondary | ICD-10-CM

## 2018-02-01 MED ORDER — ACETAMINOPHEN 500 MG TABLET
3 refills | 0 days | Status: CP
Start: 2018-02-01 — End: 2018-06-22
  Filled 2018-02-01: qty 180, 30d supply, fill #0

## 2018-02-01 MED ORDER — ALLOPURINOL 100 MG TABLET
ORAL_TABLET | ORAL | 1 refills | 0.00000 days
Start: 2018-02-01 — End: 2018-03-04

## 2018-02-01 MED ORDER — WARFARIN 6 MG TABLET
3 refills | 0 days | Status: CP
Start: 2018-02-01 — End: 2018-03-14
  Filled 2018-02-01: qty 120, 30d supply, fill #0

## 2018-02-01 MED FILL — LEVEMIR FLEXTOUCH U-100 INSULIN 100 UNIT/ML (3 ML) SUBCUTANEOUS PEN: 19 days supply | Qty: 15 | Fill #2 | Status: AC

## 2018-02-01 MED FILL — WARFARIN 6 MG TABLET: 30 days supply | Qty: 120 | Fill #0 | Status: AC

## 2018-02-01 MED FILL — NOVOLOG FLEXPEN U-100 INSULIN ASPART 100 UNIT/ML (3 ML) SUBCUTANEOUS: SUBCUTANEOUS | 34 days supply | Qty: 15 | Fill #1

## 2018-02-01 MED FILL — NOVOLOG FLEXPEN U-100 INSULIN ASPART 100 UNIT/ML (3 ML) SUBCUTANEOUS: 34 days supply | Qty: 15 | Fill #1 | Status: AC

## 2018-02-01 MED FILL — ACETAMINOPHEN 500 MG TABLET: 30 days supply | Qty: 180 | Fill #0 | Status: AC

## 2018-02-01 MED FILL — ALLOPURINOL 100 MG TABLET: ORAL | 32 days supply | Qty: 8 | Fill #1

## 2018-02-01 MED FILL — LEVEMIR FLEXTOUCH U-100 INSULIN 100 UNIT/ML (3 ML) SUBCUTANEOUS PEN: SUBCUTANEOUS | 19 days supply | Qty: 15 | Fill #2

## 2018-02-01 MED FILL — ALLOPURINOL 100 MG TABLET: 32 days supply | Qty: 8 | Fill #1 | Status: AC

## 2018-02-05 MED ORDER — DICLOFENAC 1 % TOPICAL GEL
TOPICAL | PRN refills | 0 days | Status: SS
Start: 2018-02-05 — End: 2018-03-12

## 2018-02-12 ENCOUNTER — Ambulatory Visit: Admit: 2018-02-12 | Discharge: 2018-02-12 | Payer: MEDICARE

## 2018-02-12 ENCOUNTER — Ambulatory Visit: Admit: 2018-02-12 | Discharge: 2018-02-12 | Payer: MEDICARE | Attending: Neurology | Primary: Neurology

## 2018-02-12 DIAGNOSIS — I480 Paroxysmal atrial fibrillation: Principal | ICD-10-CM

## 2018-02-12 DIAGNOSIS — G44209 Tension-type headache, unspecified, not intractable: Principal | ICD-10-CM

## 2018-02-12 DIAGNOSIS — G43709 Chronic migraine without aura, not intractable, without status migrainosus: Secondary | ICD-10-CM

## 2018-02-12 DIAGNOSIS — Z7901 Long term (current) use of anticoagulants: Secondary | ICD-10-CM

## 2018-02-12 DIAGNOSIS — M21372 Foot drop, left foot: Secondary | ICD-10-CM

## 2018-02-12 DIAGNOSIS — I10 Essential (primary) hypertension: Principal | ICD-10-CM

## 2018-02-12 DIAGNOSIS — M5481 Occipital neuralgia: Principal | ICD-10-CM

## 2018-02-12 DIAGNOSIS — G43011 Migraine without aura, intractable, with status migrainosus: Secondary | ICD-10-CM

## 2018-02-12 DIAGNOSIS — N184 Chronic kidney disease, stage 4 (severe): Secondary | ICD-10-CM

## 2018-02-12 MED ORDER — QUETIAPINE 50 MG TABLET
ORAL_TABLET | Freq: Two times a day (BID) | ORAL | 0 refills | 0.00000 days | Status: CP | PRN
Start: 2018-02-12 — End: 2018-03-09
  Filled 2018-02-16: qty 20, 10d supply, fill #0

## 2018-02-13 ENCOUNTER — Ambulatory Visit: Admit: 2018-02-13 | Discharge: 2018-02-14 | Payer: MEDICARE

## 2018-02-13 DIAGNOSIS — G44209 Tension-type headache, unspecified, not intractable: Principal | ICD-10-CM

## 2018-02-15 ENCOUNTER — Ambulatory Visit: Admit: 2018-02-15 | Discharge: 2018-02-16 | Payer: MEDICARE

## 2018-02-15 DIAGNOSIS — N184 Chronic kidney disease, stage 4 (severe): Secondary | ICD-10-CM

## 2018-02-15 DIAGNOSIS — G44209 Tension-type headache, unspecified, not intractable: Principal | ICD-10-CM

## 2018-02-15 DIAGNOSIS — I10 Essential (primary) hypertension: Secondary | ICD-10-CM

## 2018-02-15 MED ORDER — HYDRALAZINE 25 MG TABLET
ORAL_TABLET | Freq: Three times a day (TID) | ORAL | 11 refills | 0.00000 days | Status: CP
Start: 2018-02-15 — End: 2018-03-18
  Filled 2018-02-16: qty 120, 40d supply, fill #0

## 2018-02-16 MED ORDER — NITROGLYCERIN 0.4 MG SUBLINGUAL TABLET
SUBLINGUAL | 3 refills | 0.00000 days | Status: CP | PRN
Start: 2018-02-16 — End: 2018-03-18
  Filled 2018-03-01: qty 100, 90d supply, fill #0

## 2018-02-16 MED FILL — AMLODIPINE 10 MG TABLET: ORAL | 30 days supply | Qty: 30 | Fill #2

## 2018-02-16 MED FILL — HYDRALAZINE 25 MG TABLET: 40 days supply | Qty: 120 | Fill #0 | Status: AC

## 2018-02-16 MED FILL — AMLODIPINE 10 MG TABLET: 30 days supply | Qty: 30 | Fill #2 | Status: AC

## 2018-02-16 MED FILL — QUETIAPINE 50 MG TABLET: 10 days supply | Qty: 20 | Fill #0 | Status: AC

## 2018-02-18 ENCOUNTER — Ambulatory Visit: Admit: 2018-02-18 | Discharge: 2018-02-19 | Payer: MEDICARE | Attending: Neurology | Primary: Neurology

## 2018-02-18 DIAGNOSIS — M542 Cervicalgia: Secondary | ICD-10-CM

## 2018-02-18 DIAGNOSIS — M5481 Occipital neuralgia: Principal | ICD-10-CM

## 2018-03-01 ENCOUNTER — Ambulatory Visit: Admit: 2018-03-01 | Discharge: 2018-03-02 | Payer: MEDICARE | Attending: Neurology | Primary: Neurology

## 2018-03-01 ENCOUNTER — Ambulatory Visit: Admit: 2018-03-01 | Discharge: 2018-03-02 | Payer: MEDICARE | Attending: Clinical | Primary: Clinical

## 2018-03-01 DIAGNOSIS — M5481 Occipital neuralgia: Secondary | ICD-10-CM

## 2018-03-01 DIAGNOSIS — G43709 Chronic migraine without aura, not intractable, without status migrainosus: Principal | ICD-10-CM

## 2018-03-01 DIAGNOSIS — F4320 Adjustment disorder, unspecified: Principal | ICD-10-CM

## 2018-03-01 DIAGNOSIS — M542 Cervicalgia: Secondary | ICD-10-CM

## 2018-03-01 MED ORDER — CYCLOBENZAPRINE 10 MG TABLET
ORAL_TABLET | Freq: Three times a day (TID) | ORAL | 0 refills | 0.00000 days | Status: CP
Start: 2018-03-01 — End: 2018-03-15
  Filled 2018-03-01: qty 21, 7d supply, fill #0

## 2018-03-01 MED FILL — NOVOLOG FLEXPEN U-100 INSULIN ASPART 100 UNIT/ML (3 ML) SUBCUTANEOUS: 34 days supply | Qty: 15 | Fill #2 | Status: AC

## 2018-03-01 MED FILL — FLUOXETINE 20 MG CAPSULE: 90 days supply | Qty: 270 | Fill #1 | Status: AC

## 2018-03-01 MED FILL — CYCLOBENZAPRINE 10 MG TABLET: 7 days supply | Qty: 21 | Fill #0 | Status: AC

## 2018-03-01 MED FILL — FLUOXETINE 20 MG CAPSULE: 90 days supply | Qty: 270 | Fill #1

## 2018-03-01 MED FILL — UNIFINE PENTIPS 31 GAUGE X 5/16" NEEDLE: 60 days supply | Qty: 300 | Fill #1 | Status: AC

## 2018-03-01 MED FILL — LEVEMIR FLEXTOUCH U-100 INSULIN 100 UNIT/ML (3 ML) SUBCUTANEOUS PEN: SUBCUTANEOUS | 19 days supply | Qty: 15 | Fill #3

## 2018-03-01 MED FILL — ACETAMINOPHEN 500 MG TABLET: 30 days supply | Qty: 180 | Fill #1 | Status: AC

## 2018-03-01 MED FILL — NITROGLYCERIN 0.4 MG SUBLINGUAL TABLET: 90 days supply | Qty: 100 | Fill #0 | Status: AC

## 2018-03-01 MED FILL — LEVEMIR FLEXTOUCH U-100 INSULIN 100 UNIT/ML (3 ML) SUBCUTANEOUS PEN: 19 days supply | Qty: 15 | Fill #3 | Status: AC

## 2018-03-01 MED FILL — UNIFINE PENTIPS 31 GAUGE X 5/16" NEEDLE: 60 days supply | Qty: 300 | Fill #1

## 2018-03-01 MED FILL — NOVOLOG FLEXPEN U-100 INSULIN ASPART 100 UNIT/ML (3 ML) SUBCUTANEOUS: SUBCUTANEOUS | 34 days supply | Qty: 15 | Fill #2

## 2018-03-01 MED FILL — ACETAMINOPHEN 500 MG TABLET: 30 days supply | Qty: 180 | Fill #1

## 2018-03-03 MED ORDER — ATORVASTATIN 80 MG TABLET
ORAL_TABLET | Freq: Every day | ORAL | 11 refills | 0 days | Status: CP
Start: 2018-03-03 — End: 2018-03-18

## 2018-03-04 MED ORDER — ALLOPURINOL 100 MG TABLET
ORAL_TABLET | ORAL | 11 refills | 0 days | Status: CP
Start: 2018-03-04 — End: 2018-03-18

## 2018-03-08 ENCOUNTER — Other Ambulatory Visit: Payer: Self-pay

## 2018-03-08 ENCOUNTER — Encounter: Payer: Self-pay | Admitting: Emergency Medicine

## 2018-03-08 ENCOUNTER — Inpatient Hospital Stay
Admission: EM | Admit: 2018-03-08 | Discharge: 2018-03-11 | DRG: 281 | Disposition: A | Discharge status: Other Acute Inpatient Hospital | Payer: Medicare Other | Attending: Internal Medicine | Admitting: Internal Medicine

## 2018-03-08 ENCOUNTER — Emergency Department: Payer: Medicare Other

## 2018-03-08 DIAGNOSIS — Z825 Family history of asthma and other chronic lower respiratory diseases: Secondary | ICD-10-CM

## 2018-03-08 DIAGNOSIS — R791 Abnormal coagulation profile: Secondary | ICD-10-CM | POA: Diagnosis present

## 2018-03-08 DIAGNOSIS — Z7901 Long term (current) use of anticoagulants: Secondary | ICD-10-CM

## 2018-03-08 DIAGNOSIS — R739 Hyperglycemia, unspecified: Secondary | ICD-10-CM

## 2018-03-08 DIAGNOSIS — I5022 Chronic systolic (congestive) heart failure: Secondary | ICD-10-CM | POA: Diagnosis present

## 2018-03-08 DIAGNOSIS — Z7902 Long term (current) use of antithrombotics/antiplatelets: Secondary | ICD-10-CM

## 2018-03-08 DIAGNOSIS — I255 Ischemic cardiomyopathy: Secondary | ICD-10-CM | POA: Diagnosis present

## 2018-03-08 DIAGNOSIS — E871 Hypo-osmolality and hyponatremia: Secondary | ICD-10-CM | POA: Diagnosis present

## 2018-03-08 DIAGNOSIS — I130 Hypertensive heart and chronic kidney disease with heart failure and stage 1 through stage 4 chronic kidney disease, or unspecified chronic kidney disease: Secondary | ICD-10-CM | POA: Diagnosis present

## 2018-03-08 DIAGNOSIS — T82855A Stenosis of coronary artery stent, initial encounter: Principal | ICD-10-CM | POA: Diagnosis present

## 2018-03-08 DIAGNOSIS — Z8674 Personal history of sudden cardiac arrest: Secondary | ICD-10-CM

## 2018-03-08 DIAGNOSIS — Z833 Family history of diabetes mellitus: Secondary | ICD-10-CM | POA: Diagnosis not present

## 2018-03-08 DIAGNOSIS — I161 Hypertensive emergency: Secondary | ICD-10-CM | POA: Diagnosis present

## 2018-03-08 DIAGNOSIS — M21372 Foot drop, left foot: Secondary | ICD-10-CM | POA: Diagnosis present

## 2018-03-08 DIAGNOSIS — I480 Paroxysmal atrial fibrillation: Secondary | ICD-10-CM | POA: Diagnosis present

## 2018-03-08 DIAGNOSIS — Z6841 Body Mass Index (BMI) 40.0 and over, adult: Secondary | ICD-10-CM

## 2018-03-08 DIAGNOSIS — E1122 Type 2 diabetes mellitus with diabetic chronic kidney disease: Secondary | ICD-10-CM | POA: Diagnosis present

## 2018-03-08 DIAGNOSIS — Z713 Dietary counseling and surveillance: Secondary | ICD-10-CM | POA: Diagnosis not present

## 2018-03-08 DIAGNOSIS — Z95810 Presence of automatic (implantable) cardiac defibrillator: Secondary | ICD-10-CM

## 2018-03-08 DIAGNOSIS — E1165 Type 2 diabetes mellitus with hyperglycemia: Secondary | ICD-10-CM | POA: Diagnosis present

## 2018-03-08 DIAGNOSIS — E785 Hyperlipidemia, unspecified: Secondary | ICD-10-CM | POA: Diagnosis present

## 2018-03-08 DIAGNOSIS — I214 Non-ST elevation (NSTEMI) myocardial infarction: Secondary | ICD-10-CM | POA: Diagnosis present

## 2018-03-08 DIAGNOSIS — Y831 Surgical operation with implant of artificial internal device as the cause of abnormal reaction of the patient, or of later complication, without mention of misadventure at the time of the procedure: Secondary | ICD-10-CM | POA: Diagnosis present

## 2018-03-08 DIAGNOSIS — I2510 Atherosclerotic heart disease of native coronary artery without angina pectoris: Secondary | ICD-10-CM | POA: Diagnosis present

## 2018-03-08 DIAGNOSIS — Z79891 Long term (current) use of opiate analgesic: Secondary | ICD-10-CM

## 2018-03-08 DIAGNOSIS — N184 Chronic kidney disease, stage 4 (severe): Secondary | ICD-10-CM | POA: Diagnosis present

## 2018-03-08 DIAGNOSIS — I252 Old myocardial infarction: Secondary | ICD-10-CM | POA: Diagnosis not present

## 2018-03-08 DIAGNOSIS — Z720 Tobacco use: Secondary | ICD-10-CM | POA: Diagnosis not present

## 2018-03-08 DIAGNOSIS — R079 Chest pain, unspecified: Secondary | ICD-10-CM | POA: Diagnosis present

## 2018-03-08 DIAGNOSIS — Z79899 Other long term (current) drug therapy: Secondary | ICD-10-CM

## 2018-03-08 DIAGNOSIS — Z794 Long term (current) use of insulin: Secondary | ICD-10-CM

## 2018-03-08 HISTORY — DX: Chronic systolic (congestive) heart failure: I50.22

## 2018-03-08 HISTORY — DX: Ischemic cardiomyopathy: I25.5

## 2018-03-08 LAB — PROTIME-INR
INR: 0.9
Prothrombin Time: 12.1 seconds (ref 11.4–15.2)

## 2018-03-08 LAB — BASIC METABOLIC PANEL
Anion gap: 10 (ref 5–15)
BUN: 29 mg/dL — ABNORMAL HIGH (ref 6–20)
CO2: 19 mmol/L — ABNORMAL LOW (ref 22–32)
CREATININE: 2.77 mg/dL — AB (ref 0.61–1.24)
Calcium: 8.8 mg/dL — ABNORMAL LOW (ref 8.9–10.3)
Chloride: 103 mmol/L (ref 98–111)
GFR calc Af Amer: 30 mL/min — ABNORMAL LOW (ref >60)
GFR calc non Af Amer: 26 mL/min — ABNORMAL LOW (ref >60)
GLUCOSE: 433 mg/dL — AB (ref 70–99)
Potassium: 4.1 mmol/L (ref 3.5–5.1)
Sodium: 132 mmol/L — ABNORMAL LOW (ref 135–145)

## 2018-03-08 LAB — CBC
HCT: 40.6 % (ref 39.0–52.0)
Hemoglobin: 13 g/dL (ref 13.0–17.0)
MCH: 26.5 pg (ref 26.0–34.0)
MCHC: 32 g/dL (ref 30.0–36.0)
MCV: 82.9 fL (ref 80.0–100.0)
Platelets: 247 10*3/uL (ref 150–400)
RBC: 4.9 MIL/uL (ref 4.22–5.81)
RDW: 15.7 % — ABNORMAL HIGH (ref 11.5–15.5)
WBC: 10.9 10*3/uL — ABNORMAL HIGH (ref 4.0–10.5)
nRBC: 0 % (ref 0.0–0.2)

## 2018-03-08 LAB — GLUCOSE, CAPILLARY
Glucose-Capillary: 355 mg/dL — ABNORMAL HIGH (ref 70–99)
Glucose-Capillary: 180 mg/dL — ABNORMAL HIGH (ref 70–99)
Glucose-Capillary: 204 mg/dL — ABNORMAL HIGH (ref 70–99)

## 2018-03-08 LAB — TROPONIN I
Troponin I: 0.59 ng/mL (ref <0.03)
Troponin I: 1.17 ng/mL (ref <0.03)

## 2018-03-08 MED ORDER — HEPARIN SODIUM (PORCINE) 5000 UNIT/ML IJ SOLN
4000.0000 [IU] | Freq: Once | INTRAMUSCULAR | Status: AC
  Administered 2018-03-08: 4000 [IU] via INTRAVENOUS
  Filled 2018-03-08: qty 1

## 2018-03-08 MED ORDER — HYDRALAZINE HCL 20 MG/ML IJ SOLN: 10.0000 mg | INTRAMUSCULAR | Status: DC | PRN

## 2018-03-08 MED ORDER — INSULIN ASPART 100 UNIT/ML ~~LOC~~ SOLN
0.0000 [IU] | Freq: Three times a day (TID) | SUBCUTANEOUS | Status: DC
  Administered 2018-03-09: 4 [IU] via SUBCUTANEOUS
  Administered 2018-03-09: 7 [IU] via SUBCUTANEOUS
  Administered 2018-03-10: 3 [IU] via SUBCUTANEOUS
  Administered 2018-03-10: 7 [IU] via SUBCUTANEOUS
  Administered 2018-03-11 (×3): 4 [IU] via SUBCUTANEOUS
  Filled 2018-03-08 (×7): qty 1

## 2018-03-08 MED ORDER — ALUM & MAG HYDROXIDE-SIMETH 200-200-20 MG/5ML PO SUSP
30.0000 mL | Freq: Once | ORAL | Status: AC
  Administered 2018-03-08: 30 mL via ORAL
  Filled 2018-03-08: qty 30

## 2018-03-08 MED ORDER — ASPIRIN 81 MG PO CHEW
324.0000 mg | CHEWABLE_TABLET | Freq: Once | ORAL | Status: AC
  Administered 2018-03-08: 324 mg via ORAL
  Filled 2018-03-08: qty 4

## 2018-03-08 MED ORDER — HYDRALAZINE HCL 25 MG PO TABS
25.0000 mg | ORAL_TABLET | Freq: Every day | ORAL | Status: DC
  Administered 2018-03-09 – 2018-03-11 (×3): 25 mg via ORAL
  Filled 2018-03-08 (×3): qty 1

## 2018-03-08 MED ORDER — ATORVASTATIN CALCIUM 20 MG PO TABS
80.0000 mg | ORAL_TABLET | Freq: Every evening | ORAL | Status: DC
  Administered 2018-03-08 – 2018-03-11 (×3): 80 mg via ORAL
  Filled 2018-03-08 (×3): qty 4

## 2018-03-08 MED ORDER — AMIODARONE HCL 200 MG PO TABS
200.0000 mg | ORAL_TABLET | Freq: Every day | ORAL | Status: DC
  Administered 2018-03-09 – 2018-03-11 (×3): 200 mg via ORAL
  Filled 2018-03-08 (×3): qty 1

## 2018-03-08 MED ORDER — ALLOPURINOL 100 MG PO TABS
50.0000 mg | ORAL_TABLET | ORAL | Status: DC
  Administered 2018-03-10: 50 mg via ORAL
  Filled 2018-03-08: qty 0.5

## 2018-03-08 MED ORDER — TRAMADOL HCL 50 MG PO TABS
50.0000 mg | ORAL_TABLET | Freq: Four times a day (QID) | ORAL | Status: DC | PRN
  Administered 2018-03-09 – 2018-03-10 (×3): 50 mg via ORAL
  Filled 2018-03-08 (×3): qty 1

## 2018-03-08 MED ORDER — METOPROLOL TARTRATE 5 MG/5ML IV SOLN
5.0000 mg | Freq: Once | INTRAVENOUS | Status: AC
  Administered 2018-03-08: 5 mg via INTRAVENOUS
  Filled 2018-03-08: qty 5

## 2018-03-08 MED ORDER — DICLOFENAC SODIUM 1 % TD GEL
2.0000 g | Freq: Three times a day (TID) | TRANSDERMAL | Status: DC
  Filled 2018-03-08: qty 100

## 2018-03-08 MED ORDER — CARVEDILOL 25 MG PO TABS
50.0000 mg | ORAL_TABLET | Freq: Every day | ORAL | Status: DC
  Administered 2018-03-08 – 2018-03-10 (×3): 50 mg via ORAL
  Filled 2018-03-08 (×4): qty 2

## 2018-03-08 MED ORDER — VITAMIN D3 25 MCG (1000 UNIT) PO TABS
5000.0000 [IU] | ORAL_TABLET | Freq: Every day | ORAL | Status: DC
  Administered 2018-03-09 – 2018-03-11 (×3): 5000 [IU] via ORAL
  Filled 2018-03-08 (×3): qty 5

## 2018-03-08 MED ORDER — FAMOTIDINE 20 MG PO TABS
20.0000 mg | ORAL_TABLET | Freq: Every day | ORAL | Status: DC
  Administered 2018-03-09 – 2018-03-11 (×3): 20 mg via ORAL
  Filled 2018-03-08 (×3): qty 1

## 2018-03-08 MED ORDER — INSULIN ASPART 100 UNIT/ML ~~LOC~~ SOLN
15.0000 [IU] | Freq: Three times a day (TID) | SUBCUTANEOUS | Status: DC
  Administered 2018-03-09 – 2018-03-11 (×7): 15 [IU] via SUBCUTANEOUS
  Filled 2018-03-08 (×7): qty 1

## 2018-03-08 MED ORDER — ONDANSETRON HCL 4 MG/2ML IJ SOLN: 4.0000 mg | Freq: Four times a day (QID) | INTRAMUSCULAR | Status: DC | PRN

## 2018-03-08 MED ORDER — ACETAMINOPHEN 325 MG PO TABS
650.0000 mg | ORAL_TABLET | ORAL | Status: DC | PRN
  Administered 2018-03-09 – 2018-03-10 (×2): 650 mg via ORAL
  Filled 2018-03-08 (×3): qty 2

## 2018-03-08 MED ORDER — FLUOXETINE HCL 20 MG PO CAPS
60.0000 mg | ORAL_CAPSULE | Freq: Every day | ORAL | Status: DC
  Administered 2018-03-08 – 2018-03-10 (×3): 60 mg via ORAL
  Filled 2018-03-08 (×4): qty 3

## 2018-03-08 MED ORDER — INSULIN ASPART 100 UNIT/ML ~~LOC~~ SOLN
10.0000 [IU] | Freq: Once | SUBCUTANEOUS | Status: AC
  Administered 2018-03-08: 10 [IU] via INTRAVENOUS
  Filled 2018-03-08: qty 1

## 2018-03-08 MED ORDER — INSULIN DETEMIR 100 UNIT/ML ~~LOC~~ SOLN
40.0000 [IU] | Freq: Two times a day (BID) | SUBCUTANEOUS | Status: DC
  Administered 2018-03-08 – 2018-03-11 (×6): 40 [IU] via SUBCUTANEOUS
  Filled 2018-03-08 (×8): qty 0.4

## 2018-03-08 MED ORDER — BUMETANIDE 1 MG PO TABS
2.0000 mg | ORAL_TABLET | Freq: Every day | ORAL | Status: DC
  Administered 2018-03-09: 2 mg via ORAL
  Filled 2018-03-08: qty 2

## 2018-03-08 MED ORDER — EZETIMIBE 10 MG PO TABS
10.0000 mg | ORAL_TABLET | Freq: Every day | ORAL | Status: DC
  Administered 2018-03-09 – 2018-03-11 (×3): 10 mg via ORAL
  Filled 2018-03-08 (×3): qty 1

## 2018-03-08 MED ORDER — INSULIN ASPART 100 UNIT/ML ~~LOC~~ SOLN
0.0000 [IU] | Freq: Every day | SUBCUTANEOUS | Status: DC
  Administered 2018-03-08 – 2018-03-09 (×2): 2 [IU] via SUBCUTANEOUS
  Filled 2018-03-08 (×2): qty 1

## 2018-03-08 MED ORDER — ALPRAZOLAM 0.25 MG PO TABS
0.2500 mg | ORAL_TABLET | Freq: Two times a day (BID) | ORAL | Status: DC | PRN
  Administered 2018-03-10: 0.25 mg via ORAL
  Filled 2018-03-08: qty 1

## 2018-03-08 MED ORDER — NITROGLYCERIN 2 % TD OINT
1.0000 [in_us] | TOPICAL_OINTMENT | Freq: Four times a day (QID) | TRANSDERMAL | Status: DC
  Administered 2018-03-08 – 2018-03-10 (×5): 1 [in_us] via TOPICAL
  Filled 2018-03-08 (×6): qty 1

## 2018-03-08 MED ORDER — CARVEDILOL 25 MG PO TABS
25.0000 mg | ORAL_TABLET | Freq: Every morning | ORAL | Status: DC
  Administered 2018-03-09 – 2018-03-11 (×3): 25 mg via ORAL
  Filled 2018-03-08 (×3): qty 1

## 2018-03-08 MED ORDER — CARVEDILOL 25 MG PO TABS: 25.0000 mg | ORAL_TABLET | ORAL | Status: DC

## 2018-03-08 MED ORDER — AMLODIPINE BESYLATE 10 MG PO TABS
10.0000 mg | ORAL_TABLET | Freq: Every day | ORAL | Status: DC
  Administered 2018-03-09 – 2018-03-11 (×3): 10 mg via ORAL
  Filled 2018-03-08 (×3): qty 1

## 2018-03-08 MED ORDER — CYCLOBENZAPRINE HCL 10 MG PO TABS: 10.0000 mg | ORAL_TABLET | Freq: Three times a day (TID) | ORAL | Status: DC | PRN

## 2018-03-08 MED ORDER — TICAGRELOR 90 MG PO TABS
90.0000 mg | ORAL_TABLET | Freq: Two times a day (BID) | ORAL | Status: DC
  Administered 2018-03-08: 90 mg via ORAL
  Filled 2018-03-08: qty 1

## 2018-03-08 MED ORDER — WARFARIN SODIUM 4 MG PO TABS
24.0000 mg | ORAL_TABLET | Freq: Every evening | ORAL | Status: DC
  Administered 2018-03-09: 24 mg via ORAL
  Filled 2018-03-08 (×3): qty 1

## 2018-03-08 MED ORDER — METOPROLOL TARTRATE 5 MG/5ML IV SOLN: 5.0000 mg | Freq: Four times a day (QID) | INTRAVENOUS | Status: DC | PRN

## 2018-03-08 MED ORDER — SEVELAMER CARBONATE 800 MG PO TABS
1600.0000 mg | ORAL_TABLET | Freq: Three times a day (TID) | ORAL | Status: DC
  Administered 2018-03-10 – 2018-03-11 (×5): 1600 mg via ORAL
  Filled 2018-03-08 (×5): qty 2

## 2018-03-08 MED ORDER — SODIUM CHLORIDE 0.9 % IV BOLUS
1000.0000 mL | Freq: Once | INTRAVENOUS | Status: AC
  Administered 2018-03-08: 1000 mL via INTRAVENOUS

## 2018-03-08 MED ORDER — HEPARIN (PORCINE) 25000 UT/250ML-% IV SOLN
1800.0000 [IU]/h | INTRAVENOUS | Status: DC
  Administered 2018-03-08: 1500 [IU]/h via INTRAVENOUS
  Administered 2018-03-09: 1800 [IU]/h via INTRAVENOUS
  Filled 2018-03-08 (×3): qty 250

## 2018-03-08 NOTE — H&P (Signed)
[facility] NAME: Edwin [name]    MR#:  [id number]  DATE OF BIRTH:  1970-12-23  DATE OF ADMISSION:  03/08/2018  PRIMARY CARE PHYSICIAN: Patient, No Pcp Per   REQUESTING/REFERRING PHYSICIAN:   CHIEF COMPLAINT:   Chief Complaint  Patient presents with  . Chest Pain    HISTORY OF PRESENT ILLNESS: Edwin [name]  is a 47 y.o. male with a known history per below presents emergency room with chest pain described as pressure radiating into his neck/left arm/shoulders, similar to last heart attack per patient, associated with emesis, shortness of breath, lasted 5 to 10 minutes, in the emergency room patient was noted to be tachycardic, hypertensive, sinus tachycardia noted on EKG with old anterior MI, sodium 132, creatinine 2.7, glucose 355, chest x-ray negative, troponin 0.59, white count 10,000, patient evaluated in the emergency room, wife at the bedside, patient now be admitted for acute non-STEMI, hyperglycemia with chronic diabetes mellitus type 2, and acute malignant hypertension.  PAST MEDICAL HISTORY:   Past Medical History:  Diagnosis Date  . CAD (coronary artery disease)   . Diabetes mellitus without complication ([place])   . Heart attack ([place])   . Hyperlipidemia   . Hypertension   . Paroxysmal atrial fibrillation ([place])     PAST SURGICAL HISTORY:  Past Surgical History:  Procedure Laterality Date  . CARDIAC SURGERY    . IRRIGATION AND DEBRIDEMENT KNEE Left 12/17/2017   Procedure: LEFT KNEE DEBRIDEMENT AND SYNOVECTOMY;  Surgeon: Thornton Park, MD;  Location: [facility] ORS;  Service: Orthopedics;  Laterality: Left;  . JOINT REPLACEMENT      SOCIAL HISTORY:  Social History   Tobacco Use  . 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 Smokeless tobacco: Current User    Types: Snuff  Substance Use Topics  . Alcohol use: Never    Frequency: Never    FAMILY HISTORY:  Family History  Problem Relation Age of Onset  . Diabetes Mother   .  Peripheral vascular disease Mother   . COPD Father     DRUG ALLERGIES:  Allergies  Allergen Reactions  . Morphine And Related     REVIEW OF SYSTEMS:   CONSTITUTIONAL: No fever, fatigue or weakness.  EYES: No blurred or double vision.  EARS, NOSE, AND THROAT: No tinnitus or ear pain.  RESPIRATORY: No cough, +shortness of breath, no wheezing or hemoptysis.  CARDIOVASCULAR: + chest pain, no orthopnea, edema.  GASTROINTESTINAL: + nausea, no vomiting, diarrhea or abdominal pain.  GENITOURINARY: No dysuria, hematuria.  ENDOCRINE: No polyuria, nocturia,  HEMATOLOGY: No anemia, easy bruising or bleeding SKIN: No rash or lesion. MUSCULOSKELETAL: No joint pain or arthritis.   NEUROLOGIC: No tingling, numbness, weakness.  PSYCHIATRY: No anxiety or depression.   MEDICATIONS AT HOME:  Prior to Admission medications   Medication Sig Start Date End Date Taking? Authorizing Provider  allopurinol (ZYLOPRIM) 100 MG tablet Take 0.5 tablets (50 mg total) by mouth every other day. 12/21/17 03/08/18 Yes Sainani, Belia Heman, MD  amiodarone (PACERONE) 200 MG tablet Take 200 mg by mouth daily. 09/14/17  Yes [provider]  amLODipine (NORVASC) 10 MG tablet Take 10 mg by mouth daily. 10/16/17  Yes [provider]  atorvastatin (LIPITOR) 80 MG tablet Take 80 mg by mouth every evening.  09/14/17  Yes [provider]  BRILINTA 90 MG TABS tablet Take 90 mg by mouth 2 (two) times daily. 09/14/17  Yes [provider]  bumetanide (BUMEX) 2 MG  tablet Take 2 mg by mouth daily. 09/14/17  Yes [provider]  carvedilol (COREG) 25 MG tablet Take 25-50 mg by mouth See admin instructions. Take 1 tablet (25MG ) by mouth every morning and 2 tablets (50MG ) by mouth every night 10/16/17  Yes [provider]  cholecalciferol (VITAMIN D) 1000 units tablet Take 5,000 Units by mouth daily.   Yes [provider]  cyclobenzaprine (FLEXERIL) 10 MG tablet Take 10 mg by mouth 3  (three) times daily as needed for muscle pain. 03/01/18  Yes [provider]  ezetimibe (ZETIA) 10 MG tablet Take 10 mg by mouth daily. 10/14/17  Yes [provider]  famotidine (PEPCID) 20 MG tablet Take 20 mg by mouth daily. 09/14/17  Yes [provider]  FLUoxetine (PROZAC) 20 MG capsule Take 60 mg by mouth at bedtime.  12/04/17  Yes [provider]  hydrALAZINE (APRESOLINE) 25 MG tablet Take 25 mg by mouth daily.   Yes [provider]  insulin aspart (NOVOLOG) 100 UNIT/ML injection Inject 15 Units into the skin 3 (three) times daily with meals. 12/20/17  Yes Sainani, Belia Heman, MD  insulin detemir (LEVEMIR) 100 UNIT/ML injection Inject 0.4 mLs (40 Units total) into the skin 2 (two) times daily. 12/20/17  Yes Henreitta Leber, MD  nitroGLYCERIN (NITROSTAT) 0.4 MG SL tablet Place 0.4 mg under the tongue every 5 (five) minutes as needed for chest pain.   Yes [provider]  RENVELA 800 MG tablet Take 1,600 mg by mouth 3 (three) times daily with meals. 10/14/17  Yes [provider]  traMADol (ULTRAM) 50 MG tablet Take 1 tablet (50 mg total) by mouth every 6 (six) hours as needed. 12/20/17  Yes Sainani, Belia Heman, MD  VOLTAREN 1 % GEL Apply 2 g topically 3 (three) times daily. 12/04/17  Yes [provider]  warfarin (COUMADIN) 6 MG tablet Take 24 mg by mouth every evening.  10/16/17  Yes [provider]      PHYSICAL EXAMINATION:   VITAL SIGNS: Blood pressure (!) 181/107, pulse 95, temperature 98.2 F (36.8 C), temperature source Oral, resp. rate 18, height 6\' 3"  (1.905 m), weight (!) 147.4 kg, SpO2 97 %.  GENERAL:  47 y.o.-year-old patient lying in the bed with no acute distress.  Extreme morbid obesity eYES: Pupils equal, round, reactive to light and accommodation. No scleral icterus. Extraocular muscles intact.  HEENT: Head atraumatic, normocephalic. Oropharynx and nasopharynx clear.  NECK:  Supple, no jugular venous  distention. No thyroid enlargement, no tenderness.  LUNGS: Normal breath sounds bilaterally, no wheezing, rales,rhonchi or crepitation. No use of accessory muscles of respiration.  CARDIOVASCULAR: S1, S2 normal. No murmurs, rubs, or gallops.  ABDOMEN: Soft, nontender, nondistended. Bowel sounds present. No organomegaly or mass.  EXTREMITIES: No pedal edema, cyanosis, or clubbing.  NEUROLOGIC: Cranial nerves II through XII are intact. Muscle strength 5/5 in all extremities. Sensation intact. Gait not checked.  PSYCHIATRIC: The patient is alert and oriented x 3.  SKIN: No obvious rash, lesion, or ulcer.   LABORATORY PANEL:   CBC Recent Labs  Lab 03/08/18 1822  WBC 10.9*  HGB 13.0  HCT 40.6  PLT 247  MCV 82.9  MCH 26.5  MCHC 32.0  RDW 15.7*   ------------------------------------------------------------------------------------------------------------------  Chemistries  Recent Labs  Lab 03/08/18 1822  NA 132*  K 4.1  CL 103  CO2 19*  GLUCOSE 433*  BUN 29*  CREATININE 2.77*  CALCIUM 8.8*   ------------------------------------------------------------------------------------------------------------------ estimated creatinine clearance is  50.1 mL/min (A) (by C-G formula based on SCr of 2.77 mg/dL (H)). ------------------------------------------------------------------------------------------------------------------ No results for input(s): TSH, T4TOTAL, T3FREE, THYROIDAB in the last 72 hours.  Invalid input(s): FREET3   Coagulation profile Recent Labs  Lab 03/08/18 1822  INR 0.90   ------------------------------------------------------------------------------------------------------------------- No results for input(s): DDIMER in the last 72 hours. -------------------------------------------------------------------------------------------------------------------  Cardiac Enzymes Recent Labs  Lab 03/08/18 1822  TROPONINI 0.59*    ------------------------------------------------------------------------------------------------------------------ Invalid input(s): POCBNP  ---------------------------------------------------------------------------------------------------------------  Urinalysis No results found for: COLORURINE, APPEARANCEUR, LABSPEC, PHURINE, GLUCOSEU, HGBUR, BILIRUBINUR, KETONESUR, PROTEINUR, UROBILINOGEN, NITRITE, LEUKOCYTESUR   RADIOLOGY: Dg Chest 2 View  Result Date: 03/08/2018 CLINICAL DATA:  47 year old male with chest tightness and numbness in both arms beginning at 1700 hours. EXAM: CHEST - 2 VIEW COMPARISON:  Portable chest 12/16/2017. FINDINGS: Chronic left chest cardiac AICD and chronic left rib fractures. Lung volumes and mediastinal contours within normal limits. Both lungs appear clear. No pneumothorax or pleural effusion. Visualized tracheal air column is within normal limits. No acute osseous abnormality identified. Paucity of bowel gas in the upper abdomen. IMPRESSION: No acute cardiopulmonary abnormality. Electronically Signed   By: Genevie Ann M.D.   On: 03/08/2018 18:53    EKG: Orders placed or performed during the hospital encounter of 03/08/18  . EKG 12-Lead  . EKG 12-Lead  . ED EKG within 10 minutes  . ED EKG within 10 minutes  . ED EKG  . ED EKG    IMPRESSION AND PLAN: *Acute non-STEMI Admit to regular nursing floor bed on our ACS protocol, Lipitor-check lipids in the morning, Brilinta, aspirin, Coreg,-none therapeutic on Coumadin, heparin drip, supplemental oxygen PRN, scheduled nitroglycerin to chest, blood pressure control per below, cardiology to see, n.p.o. after midnight  *Acute hyperglycemia with chronic diabetes mellitus type 2 Continue home regiment, start high-dose sliding scale insulin with Accu-Cheks per routine, heart healthy/carbohydrate consistent diet, check hemoglobin A1c determine level control, diabetic educator while in house  *Acute malignant  hypertension Continue home regiment, IV Lopressor 5 mg x1 now-schedule every 6 hours PRN, IV hydralazine PRN resistant hypertension, vitals per routine, make changes as per necessary  *Chronic hyperlipidemia, unspecified Continue statin therapy, check lipids in the morning  *Chronic paroxysmal A. Fib Stable Continue amiodarone, beta-blocker therapy, not therapeutic on Coumadin-currently on heparin drip  *Chronic diabetic and hypertensive nephropathy, chronic kidney disease stage IV Stable Avoid nephrotoxic agents   All the records are reviewed and case discussed with ED provider. Management plans discussed with the patient, family and they are in agreement.  CODE STATUS:full Code Status History    Date Active Date Inactive Code Status Order ID Comments User Context   12/16/2017 2226 12/17/2017 1550 Full Code [id number]  Thornton Park, MD Inpatient       TOTAL TIME TAKING CARE OF THIS PATIENT: 40 minutes.    Avel Peace Salary M.D on 03/08/2018   Between 7am to 6pm - Pager - [phone]  After 6pm go to [email or web address] - password [email or web address] [facility]  Office  [phone]  CC: Primary care physician; Patient, No Pcp Per   Note: This dictation was prepared with Dragon dictation along with smaller phrase technology. Any transcriptional errors that result from this process are unintentional.

## 2018-03-08 NOTE — ED Provider Notes (Addendum)
[facility] Emergency Department Provider Note  ____________________________________________  Time seen: Approximately 7:26 PM  I have reviewed the triage vital signs and the nursing notes.   HISTORY  Chief Complaint Chest Pain    HPI Edwin [name] is a 47 y.o. male history of CAD status multiple stents on Coumadin and that subtherapeutic at 0.9 today, HTN, HL, DM, paroxysmal A. fib, presenting with chest pain.  The patient reports that yesterday, he had 3 to 4 minutes of a central chest pressure while watching television which self resolved.  Today, at 6pm, he was sitting with his wife when he developed a central chest pressure that was severe, radiated to both shoulders, and up into his neck, and caused both of his hands to become numb.  He had associated shortness of breath and diaphoresis and had one episode of nausea and vomiting.  This lasted for several minutes and has now completely resolved.  Past Medical History:  Diagnosis Date  . CAD (coronary artery disease)   . Diabetes mellitus without complication ([place])   . Heart attack ([place])   . Hyperlipidemia   . Hypertension   . Paroxysmal atrial fibrillation [facility])     Patient Active Problem List   Diagnosis Date Noted  . 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 CARDIAC SURGERY    . IRRIGATION AND DEBRIDEMENT KNEE Left 12/17/2017   Procedure: LEFT KNEE DEBRIDEMENT AND SYNOVECTOMY;  Surgeon: Thornton Park, MD;  Location: [facility] ORS;  Service: Orthopedics;  Laterality: Left;  . JOINT REPLACEMENT      Current Outpatient Rx  . Order #: [id number] Class: Print  . Order #: [id number] Class: Historical Med  . Order #: [id number] Class: Historical Med  . Order #: [id number] Class: Historical Med  . Order #: [id number] Class: Historical Med  . Order #: [id number] Class: Historical Med  . Order #: [id number] Class: Historical Med  . Order #: [id number] Class:  Historical Med  . Order #: [id number] Class: Historical Med  . Order #: [id number] Class: Historical Med  . Order #: [id number] Class: Historical Med  . Order #: [id number] Class: Print  . Order #: [id number] Class: Print  . Order #: [id number] Class: Historical Med  . Order #: [id number] Class: Print  . Order #: [id number] Class: Historical Med  . Order #: [id number] Class: Historical Med    Allergies Morphine and related  Family History  Problem Relation Age of Onset  . Diabetes Mother   . Peripheral vascular disease Mother   . COPD Father     Social History Social History   Tobacco Use  . 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 Smokeless tobacco: Current User    Types: Snuff  Substance Use Topics  . Alcohol use: Never    Frequency: Never  . Drug use: Never    Review of Systems Constitutional: No fever/chills.  No lightheadedness or syncope.  Positive diaphoresis.  Positive general malaise. Eyes: No visual changes. ENT: No sore throat. No congestion or rhinorrhea. Cardiovascular: As of central chest pain radiating to the bilateral shoulders and left neck.  . Denies palpitations. Respiratory: Positive shortness of breath.  No cough. Gastrointestinal: No abdominal pain.  No nausea, no vomiting.  No diarrhea.  No constipation. Genitourinary: Negative for dysuria. Musculoskeletal: Negative for back pain.  No lower extremity swelling or calf pain. Skin: Negative for rash. Neurological: Negative for headaches. No focal numbness, tingling or weakness.     ____________________________________________   PHYSICAL EXAM:  VITAL SIGNS: ED Triage Vitals  Enc Vitals Group     BP 03/08/18 1821 (!) 185/107     Pulse Rate 03/08/18 1821 (!) 111     Resp 03/08/18 1821 (!) 22     Temp 03/08/18 1821 98.2 F (36.8 C)     Temp Source 03/08/18 1821 Oral     SpO2 03/08/18 1821 97 %     Weight 03/08/18 1819 (!) 325 lb (147.4 kg)     Height 03/08/18 1819 6\' 3"  (1.905 m)     Head Circumference --       Peak Flow --      Pain Score 03/08/18 1819 7     Pain Loc --      Pain Edu? --      Excl. in [place]? --     Constitutional: Alert and oriented.  Answers questions appropriately.  Chronically ill-appearing. Eyes: Conjunctivae are normal.  EOMI. No scleral icterus. Head: Atraumatic. Nose: No congestion/rhinnorhea. Mouth/Throat: Mucous membranes are moist.  Neck: No stridor.  Supple.  No JVD.  No meningismus. Cardiovascular: Normal rate, regular rhythm. No murmurs, rubs or gallops.  Respiratory: Normal respiratory effort.  No accessory muscle use or retractions. Lungs CTAB.  No wheezes, rales or ronchi. Gastrointestinal: Obese.  Soft, nontender and nondistended.  No guarding or rebound.  No peritoneal signs. Musculoskeletal: No LE edema. No ttp in the calves or palpable cords.  Negative Homan's sign. Neurologic:  A&Ox3.  Speech is clear.  Face and smile are symmetric.  EOMI.  Moves all extremities well. Skin:  Skin is warm, dry and intact. No rash noted. Psychiatric: Mood and affect are normal. Speech and behavior are normal.  Normal judgement.  ____________________________________________   LABS (all labs ordered are listed, but only abnormal results are displayed)  Labs Reviewed  BASIC METABOLIC PANEL - Abnormal; Notable for the following components:      Result Value   Sodium 132 (*)    CO2 19 (*)    Glucose, Bld 433 (*)    BUN 29 (*)    Creatinine, Ser 2.77 (*)    Calcium 8.8 (*)    GFR calc non Af Amer 26 (*)    GFR calc Af Amer 30 (*)    All other components within normal limits  CBC - Abnormal; Notable for the following components:   WBC 10.9 (*)    RDW 15.7 (*)    All other components within normal limits  TROPONIN I - Abnormal; Notable for the following components:   Troponin I 0.59 (*)    All other components within normal limits  PROTIME-INR   ____________________________________________  EKG  ED ECG REPORT I, Anne-Caroline Mariea Clonts, the attending physician,  personally viewed and interpreted this ECG.   Date: 03/08/2018  EKG Time: 1817  Rate: 119  Rhythm: sinus tachycardia  Axis: normal  Intervals:none  ST&T Change: No STEMI  Repeat EKG: ED ECG REPORT I, Anne-Caroline Mariea Clonts, the attending physician, personally viewed and interpreted this ECG.   Date: 03/08/2018  EKG Time: 1935  Rate: 97  Rhythm: normal sinus rhythm  Axis: normal  Intervals:none  ST&T Change: No STEMI  This EKG shows resolution of the patient's sinus tachycardia.   ____________________________________________  RADIOLOGY  Dg Chest 2 View  Result Date: 03/08/2018 CLINICAL DATA:  47 year old male with chest tightness and numbness in both arms beginning at 1700 hours. EXAM: CHEST - 2 VIEW COMPARISON:  Portable chest 12/16/2017. FINDINGS: Chronic left chest cardiac AICD and chronic left rib fractures. Lung volumes and  mediastinal contours within normal limits. Both lungs appear clear. No pneumothorax or pleural effusion. Visualized tracheal air column is within normal limits. No acute osseous abnormality identified. Paucity of bowel gas in the upper abdomen. IMPRESSION: No acute cardiopulmonary abnormality. Electronically Signed   By: Genevie Ann M.D.   On: 03/08/2018 18:53    ____________________________________________   PROCEDURES  Procedure(s) performed: None  Procedures  Critical Care performed: Yes, see critical care note(s) ____________________________________________   INITIAL IMPRESSION / ASSESSMENT AND PLAN / ED COURSE  Pertinent labs & imaging results that were available during my care of the patient were reviewed by me and considered in my medical decision making (see chart for details).  47 y.o. with a history of CAD status post multiple stents, on Coumadin but subtherapeutic today, presenting with chest pain and a positive troponin.  Overall, I am concerned about ACS or MI in this very high risk patient.  He is markedly hypertensive and  tachycardic but we will recheck his vital signs at this time.  His chest x-ray does not show any acute cardiopulmonary process he is on Coumadin, but is subtherapeutic, so I have ordered aspirin and heparin for him as I am concerned about an acute event today.  I have also considered PE and aortic pathology, but this is less likely.  At this time, the patient is pain-free but we will continue to monitor him closely.  The patient is also markedly hyperglycemic with a glucose of 433 without evidence of DKA.  He is accordingly mildly hyponatremic.  He has chronic renal insufficiency.  We will admit him to the hospitalist for continued evaluation and treatment.  CRITICAL CARE Performed by: Eula Listen   Total critical care time: 40 minutes  Critical care time was exclusive of separately billable procedures and treating other patients.  Critical care was necessary to treat or prevent imminent or life-threatening deterioration.  Critical care was time spent personally by me on the following activities: development of treatment plan with patient and/or surrogate as well as nursing, discussions with consultants, evaluation of patient's response to treatment, examination of patient, obtaining history from patient or surrogate, ordering and performing treatments and interventions, ordering and review of laboratory studies, ordering and review of radiographic studies, pulse oximetry and re-evaluation of patient's condition.   ____________________________________________  FINAL CLINICAL IMPRESSION(S) / ED DIAGNOSES  Final diagnoses:  NSTEMI (non-ST elevated myocardial infarction) ([place])  Hyperglycemia  Hypertensive emergency         NEW MEDICATIONS STARTED DURING THIS VISIT:  New Prescriptions   No medications on file      Eula Listen, MD 03/08/18 1933    Eula Listen, MD 03/08/18 1943

## 2018-03-08 NOTE — ED Triage Notes (Signed)
Here for left sided chest pressure/tightness that started about 30 minutes ago. Radiates to arm/left neck. Feels like last MI per pt.  Is on coumadin, last INR was 1 month ago. Vomited before coming. Took 162 mg ASA before coming.  Appears uncomfortable.  Had [facility] initially but has eased some.

## 2018-03-08 NOTE — Progress Notes (Signed)
ANTICOAGULATION CONSULT NOTE - Initial Consult  Pharmacy Consult for Heparin  Indication: chest pain/ACS  Allergies  Allergen Reactions  . Morphine And Related     Patient Measurements: Height: 6\' 3"  (190.5 cm) Weight: (!) 325 lb (147.4 kg) IBW/kg (Calculated) : 84.5 Heparin Dosing Weight: 118.2 kg   Vital Signs: Temp: 98.2 F (36.8 C) (12/09 1821) Temp Source: Oral (12/09 1821) BP: 181/107 (12/09 2004) Pulse Rate: 95 (12/09 2004)  Labs: Recent Labs    03/08/18 1822  HGB 13.0  HCT 40.6  PLT 247  LABPROT 12.1  INR 0.90  CREATININE 2.77*  TROPONINI 0.59*    Estimated Creatinine Clearance: 50.1 mL/min (A) (by C-G formula based on SCr of 2.77 mg/dL (H)).   Medical History: Past Medical History:  Diagnosis Date  . CAD (coronary artery disease)   . Diabetes mellitus without complication ([place])   . Heart attack ([place])   . Hyperlipidemia   . Hypertension   . Paroxysmal atrial fibrillation ([name])     Medications:   (Not in a hospital admission)  Assessment: Pharmacy consulted to dose heparin in this 47 year old male with NSTEMI.  Pt was on warfarin previously but subtherapeutic INR.  CrCl = 50.1 ml/min  Goal of Therapy:  Heparin level 0.3-0.7 units/ml Monitor platelets by anticoagulation protocol: Yes   Plan:  Give 4000 units bolus x 1 Start heparin infusion at 1500 units/hr Check anti-Xa level in 6 hours and daily while on heparin Continue to monitor H&H and platelets  Ratasha Fabre D 03/08/2018,8:56 PM

## 2018-03-08 NOTE — Progress Notes (Signed)
Family Meeting Note  Advance Directive:yes  Today a meeting took place with the Patient.  Patient is able to participate   The following clinical team members were present during this meeting:MD  The following were discussed:Patient's diagnosis: nstemi, Patient's progosis: Unable to determine and Goals for treatment: Full Code  Additional follow-up to be provided: prn  Time spent during discussion:20 minutes  Gorden Harms, MD

## 2018-03-08 NOTE — Unmapped (Signed)
CARE MANAGEMENT ENCOUNTER    LCSW contacted Ms. Demry with [place] [employer] to check regarding pt's medicaid eligibility. LCSW learned that pt would only be able to qualify for medicaid if he and his family met a deductible for services. Learned that pt's income is currently 1410 a month and that this puts him outside of the range for being able to qualify. Learned that if pt owes a large balance to the hospital that he could likely qualify for emergency medicaid for a short period of time but that since pt also has Medicare it is unlikely there is a large balance outstanding. Ms. Lennie Odor shared that she had already informed pt regarding this. LCSW to share w/pt's care team.    Time Spent: 10 minutes    Lennice Sites. Curly Rim, MSW, LCSW  [facility]  Phone: [phone]  Pager: [phone]

## 2018-03-09 ENCOUNTER — Encounter: Payer: Self-pay | Admitting: Internal Medicine

## 2018-03-09 ENCOUNTER — Encounter
Admission: EM | Disposition: A | Discharge status: Other Acute Inpatient Hospital | Payer: Self-pay | Source: Home / Self Care | Attending: Internal Medicine

## 2018-03-09 ENCOUNTER — Other Ambulatory Visit: Payer: Self-pay

## 2018-03-09 DIAGNOSIS — I2510 Atherosclerotic heart disease of native coronary artery without angina pectoris: Secondary | ICD-10-CM

## 2018-03-09 DIAGNOSIS — I5022 Chronic systolic (congestive) heart failure: Secondary | ICD-10-CM

## 2018-03-09 DIAGNOSIS — I214 Non-ST elevation (NSTEMI) myocardial infarction: Secondary | ICD-10-CM

## 2018-03-09 DIAGNOSIS — N184 Chronic kidney disease, stage 4 (severe): Secondary | ICD-10-CM

## 2018-03-09 DIAGNOSIS — I480 Paroxysmal atrial fibrillation: Secondary | ICD-10-CM

## 2018-03-09 HISTORY — PX: LEFT HEART CATH AND CORONARY ANGIOGRAPHY: CATH118249

## 2018-03-09 LAB — CBC
HCT: 33.7 % — ABNORMAL LOW (ref 39.0–52.0)
Hemoglobin: 10.5 g/dL — ABNORMAL LOW (ref 13.0–17.0)
MCH: 26.1 pg (ref 26.0–34.0)
MCHC: 31.2 g/dL (ref 30.0–36.0)
MCV: 83.6 fL (ref 80.0–100.0)
Platelets: 202 10*3/uL (ref 150–400)
RBC: 4.03 MIL/uL — ABNORMAL LOW (ref 4.22–5.81)
RDW: 15.9 % — AB (ref 11.5–15.5)
WBC: 9.7 10*3/uL (ref 4.0–10.5)
nRBC: 0 % (ref 0.0–0.2)

## 2018-03-09 LAB — BASIC METABOLIC PANEL
Anion gap: 8 (ref 5–15)
BUN: 29 mg/dL — ABNORMAL HIGH (ref 6–20)
CALCIUM: 8.1 mg/dL — AB (ref 8.9–10.3)
CO2: 19 mmol/L — AB (ref 22–32)
Chloride: 107 mmol/L (ref 98–111)
Creatinine, Ser: 2.78 mg/dL — ABNORMAL HIGH (ref 0.61–1.24)
GFR calc Af Amer: 30 mL/min — ABNORMAL LOW (ref >60)
GFR calc non Af Amer: 26 mL/min — ABNORMAL LOW (ref >60)
GLUCOSE: 202 mg/dL — AB (ref 70–99)
Potassium: 4.2 mmol/L (ref 3.5–5.1)
Sodium: 134 mmol/L — ABNORMAL LOW (ref 135–145)

## 2018-03-09 LAB — GLUCOSE, CAPILLARY
GLUCOSE-CAPILLARY: 71 mg/dL (ref 70–99)
Glucose-Capillary: 207 mg/dL — ABNORMAL HIGH (ref 70–99)
Glucose-Capillary: 181 mg/dL — ABNORMAL HIGH (ref 70–99)
Glucose-Capillary: 237 mg/dL — ABNORMAL HIGH (ref 70–99)
Glucose-Capillary: 117 mg/dL — ABNORMAL HIGH (ref 70–99)
Glucose-Capillary: 54 mg/dL — ABNORMAL LOW (ref 70–99)
Glucose-Capillary: 94 mg/dL (ref 70–99)
Glucose-Capillary: 172 mg/dL — ABNORMAL HIGH (ref 70–99)
Glucose-Capillary: 215 mg/dL — ABNORMAL HIGH (ref 70–99)

## 2018-03-09 LAB — HEPARIN LEVEL (UNFRACTIONATED)
Heparin Unfractionated: <0.1 IU/mL — ABNORMAL LOW (ref 0.30–0.70)
Heparin Unfractionated: 0.3 IU/mL (ref 0.30–0.70)

## 2018-03-09 LAB — TROPONIN I
Troponin I: 1.38 ng/mL (ref <0.03)
Troponin I: 1.72 ng/mL (ref <0.03)

## 2018-03-09 SURGERY — LEFT HEART CATH AND CORONARY ANGIOGRAPHY
Anesthesia: Moderate Sedation | Laterality: Left

## 2018-03-09 MED ORDER — DEXTROSE 50 % IV SOLN
INTRAVENOUS | Status: AC
  Administered 2018-03-09: 50 mL via INTRAVENOUS
  Filled 2018-03-09: qty 50

## 2018-03-09 MED ORDER — DEXTROSE 50 % IV SOLN
INTRAVENOUS | Status: DC | PRN
  Administered 2018-03-09: 25 mL via INTRAVENOUS

## 2018-03-09 MED ORDER — SODIUM CHLORIDE 0.9% FLUSH: 3.0000 mL | INTRAVENOUS | Status: DC | PRN

## 2018-03-09 MED ORDER — HEPARIN SODIUM (PORCINE) 1000 UNIT/ML IJ SOLN
INTRAMUSCULAR | Status: AC
  Filled 2018-03-09: qty 1

## 2018-03-09 MED ORDER — HEPARIN BOLUS VIA INFUSION
3600.0000 [IU] | Freq: Once | INTRAVENOUS | Status: AC
  Administered 2018-03-09: 3600 [IU] via INTRAVENOUS
  Filled 2018-03-09: qty 3600

## 2018-03-09 MED ORDER — SODIUM CHLORIDE 0.9 % IV SOLN
INTRAVENOUS | Status: DC
  Administered 2018-03-09 (×2): via INTRAVENOUS

## 2018-03-09 MED ORDER — SODIUM CHLORIDE 0.9% FLUSH: 3.0000 mL | Freq: Two times a day (BID) | INTRAVENOUS | Status: DC

## 2018-03-09 MED ORDER — MIDAZOLAM HCL 2 MG/2ML IJ SOLN
INTRAMUSCULAR | Status: DC | PRN
  Administered 2018-03-09: 1 mg via INTRAVENOUS

## 2018-03-09 MED ORDER — FENTANYL CITRATE (PF) 100 MCG/2ML IJ SOLN
INTRAMUSCULAR | Status: AC
  Filled 2018-03-09: qty 2

## 2018-03-09 MED ORDER — ASPIRIN 81 MG PO CHEW: 81.0000 mg | CHEWABLE_TABLET | ORAL | Status: DC

## 2018-03-09 MED ORDER — FENTANYL CITRATE (PF) 100 MCG/2ML IJ SOLN
INTRAMUSCULAR | Status: DC | PRN
  Administered 2018-03-09: 50 ug via INTRAVENOUS

## 2018-03-09 MED ORDER — MIDAZOLAM HCL 2 MG/2ML IJ SOLN
INTRAMUSCULAR | Status: AC
  Filled 2018-03-09: qty 2

## 2018-03-09 MED ORDER — HEPARIN (PORCINE) IN NACL 1000-0.9 UT/500ML-% IV SOLN
INTRAVENOUS | Status: AC
  Filled 2018-03-09: qty 1000

## 2018-03-09 MED ORDER — DEXTROSE 50 % IV SOLN
1.0000 | Freq: Once | INTRAVENOUS | Status: AC
  Administered 2018-03-09: 50 mL via INTRAVENOUS

## 2018-03-09 MED ORDER — SODIUM CHLORIDE 0.9% FLUSH
3.0000 mL | Freq: Two times a day (BID) | INTRAVENOUS | Status: DC
  Administered 2018-03-09 – 2018-03-10 (×3): 3 mL via INTRAVENOUS

## 2018-03-09 MED ORDER — HEPARIN (PORCINE) 25000 UT/250ML-% IV SOLN
2200.0000 [IU]/h | INTRAVENOUS | Status: DC
  Administered 2018-03-09: 1800 [IU]/h via INTRAVENOUS
  Administered 2018-03-10: 2000 [IU]/h via INTRAVENOUS
  Administered 2018-03-11: 2200 [IU]/h via INTRAVENOUS
  Filled 2018-03-09 (×3): qty 250

## 2018-03-09 MED ORDER — ASPIRIN EC 81 MG PO TBEC
81.0000 mg | DELAYED_RELEASE_TABLET | Freq: Every day | ORAL | Status: DC
  Administered 2018-03-10 – 2018-03-11 (×2): 81 mg via ORAL
  Filled 2018-03-09 (×2): qty 1

## 2018-03-09 MED ORDER — VERAPAMIL HCL 2.5 MG/ML IV SOLN
INTRAVENOUS | Status: DC | PRN
  Administered 2018-03-09: 2.5 mg via INTRA_ARTERIAL

## 2018-03-09 MED ORDER — HEPARIN SODIUM (PORCINE) 1000 UNIT/ML IJ SOLN
INTRAMUSCULAR | Status: DC | PRN
  Administered 2018-03-09: 5000 [IU] via INTRAVENOUS

## 2018-03-09 MED ORDER — SODIUM CHLORIDE 0.9 % IV SOLN: 250.0000 mL | INTRAVENOUS | Status: DC | PRN

## 2018-03-09 MED ORDER — VERAPAMIL HCL 2.5 MG/ML IV SOLN
INTRAVENOUS | Status: AC
  Filled 2018-03-09: qty 2

## 2018-03-09 MED ORDER — SODIUM CHLORIDE 0.9% FLUSH
3.0000 mL | Freq: Two times a day (BID) | INTRAVENOUS | Status: DC
  Administered 2018-03-09 – 2018-03-10 (×2): 3 mL via INTRAVENOUS

## 2018-03-09 MED ORDER — SODIUM CHLORIDE 0.9 % IV SOLN: INTRAVENOUS | Status: DC

## 2018-03-09 MED ORDER — DEXTROSE 50 % IV SOLN
INTRAVENOUS | Status: AC
  Filled 2018-03-09: qty 50

## 2018-03-09 SURGICAL SUPPLY — 8 items
CATH INFINITI 5 FR JL3.5 (CATHETERS) ×3 IMPLANT
CATH INFINITI JR4 5F (CATHETERS) ×3 IMPLANT
DEVICE RAD COMP TR BAND LRG (VASCULAR PRODUCTS) ×3 IMPLANT
GLIDESHEATH SLEND SS 6F .021 (SHEATH) ×3 IMPLANT
KIT MANI 3VAL PERCEP (MISCELLANEOUS) ×3 IMPLANT
PACK CARDIAC CATH (CUSTOM PROCEDURE TRAY) ×3 IMPLANT
WIRE HITORQ VERSACORE ST 145CM (WIRE) ×3 IMPLANT
WIRE ROSEN-J .035X260CM (WIRE) ×6 IMPLANT

## 2018-03-09 NOTE — Progress Notes (Signed)
Inpatient Diabetes Program Recommendations  AACE/ADA: New Consensus Statement on Inpatient Glycemic Control (2015)  Target Ranges:  Prepandial:   less than 140 mg/dL      Peak postprandial:   less than 180 mg/dL (1-2 hours)      Critically ill patients:  140 - 180 mg/dL   Results for Edwin [name], Edwin [name] (MRN [id number]) as of 03/09/2018 09:00  Ref. Range 03/08/2018 19:58 03/08/2018 22:02 03/08/2018 22:26 03/09/2018 07:48  Glucose-Capillary Latest Ref Range: 70 - 99 mg/dL 355 (H)  10 units NOVOLOG  180 (H) 204 (H)  2 units NOVOLOG +  40 units LEVEMIR 207 (H)   Results for [name], [name] (MRN [id number]) as of 03/09/2018 09:07  Ref. Range 12/17/2017 02:57  Hemoglobin A1C Latest Ref Range: 4.8 - 5.6 % 12.0 (H)     Admit with: Acute Non-STEMI/ Acute Hyperglycemia  History: DM  Home DM Meds: Levemir 40 units BID       Novolog 15 units TID  Current Orders: Levemir 40 units BID      Novolog Resistant Correction Scale/ SSI (0-20 units) TID AC + HS      Novolog 15 units TID with meals       A1c was 12% back in September.  DM Coordinator met with patient during that admission to discuss home glucose control and provide counseling.  Patient's home Insulin regimen was changed from NPH Insulin 14 units BID to Levemir 40 units BID + Novolog 15 units TID at time of discharge back in September.  Note that Levemir 40 units BID started last PM--Next dose 40 units due at 10am today.   MD- [place] want to recheck A1c level to determine recent glucose control at home.    --Will follow patient during hospitalization--  Wyn Quaker RN, MSN, CDE Diabetes Coordinator Inpatient Glycemic Control Team Team Pager: [phone] (8a-5p)

## 2018-03-09 NOTE — Consult Note (Signed)
Cardiology Consultation:   Patient ID: Edwin [name] MRN: [id number]; DOB: 06/17/1970  Admit date: 03/08/2018 Date of Consult: 03/09/2018  Primary Care Provider: Patient, No Pcp Per Primary Cardiologist: Kerry Dory, MD - [facility] Primary Electrophysiologist:  Stark Klein, MD - [facility]   Patient Profile:   Edwin [name] is a 47 y.o. male with a hx of coronary artery disease with an STEMI in 40/3474 complicated by cardiogenic shock following cardiac arrest with ventricular tachycardia.  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 Posey Pronto.  History of Present Illness:   Edwin [name] had a lengthy hospitalization at [facility] beginning almost 1 year ago.  He presented to [facility] with acute onset of chest pain and was subsequently transferred to [facility].  He had cardiac arrest secondary to ventricular tachycardia with evidence of cardiogenic shock.  He underwent Impella CP placement and PCI to subtotally occluded mid LAD with 2 overlapping drug-eluting stents.  He remained in cardiogenic shock and subsequently underwent ECMO cannulation.  Vascular injury occurred during this procedure with residual left foot drop.  He also had acute on chronic kidney injury requiring temporary hemodialysis.  He subsequently followed with Dr. Denman George at [facility], having last been seen in April.  He also went ICD placement for secondary prevention.  Edwin [name]  was in his usual state of health until yesterday afternoon, when he had sudden onset of substernal chest pressure.  It was less severe and somewhat different than what he experienced leading up to his MI last year.  The pain lasted about an hour and was accompanied by shortness of breath, nausea, and vomiting.  He felt some vague palpitations but does not believe that his ICD shocked him.  He has been compliant with his medications including ticagrelor and warfarin (though INR was 0.9 on arrival here).  Other than weakness related to his foot drop, he is back to baseline in regard to his mobility and is able to perform all activities of daily living.  Upon arrival, Edwin [name] was noted to have a mild troponin elevation, which has continued to rise (most recently 1.7).  Creatinine is near baseline at 2.8.  Past Medical History:  Diagnosis Date  . CAD (coronary artery disease)   . Chronic systolic heart failure ([place])   . Diabetes mellitus without complication ([place])   . Heart attack ([place])   . Hyperlipidemia   . Hypertension   . Ischemic cardiomyopathy   . Paroxysmal atrial fibrillation [facility])     Past Surgical History:  Procedure Laterality Date  . CARDIAC SURGERY    . IRRIGATION AND DEBRIDEMENT KNEE Left 12/17/2017   Procedure: LEFT KNEE DEBRIDEMENT AND SYNOVECTOMY;  Surgeon: Thornton Park, MD;  Location: [facility] ORS;  Service: Orthopedics;  Laterality: Left;  . JOINT REPLACEMENT    . PERCUTANEOUS CORONARY STENT INTERVENTION (PCI-S)       Home Medications:  Prior to Admission medications   Medication Sig Start Date Chanese H[place]ough Date Taking? Authorizing Provider  allopurinol (ZYLOPRIM) 100 MG tablet Take 0.5 tablets (50 mg  total) by mouth every other day. 12/21/17 03/08/18 Yes Sainani, Belia Heman, MD  amiodarone (PACERONE) 200 MG tablet Take 200 mg by mouth daily. 09/14/17  Yes [provider]  amLODipine (NORVASC) 10 MG tablet Take 10 mg by mouth daily. 10/16/17  Yes [provider]    atorvastatin (LIPITOR) 80 MG tablet Take 80 mg by mouth every evening.  09/14/17  Yes [provider]  BRILINTA 90 MG TABS tablet Take 90 mg by mouth 2 (two) times daily. 09/14/17  Yes [provider]  bumetanide (BUMEX) 2 MG tablet Take 2 mg by mouth daily. 09/14/17  Yes [provider]  carvedilol (COREG) 25 MG tablet Take 25-50 mg by mouth See admin instructions. Take 1 tablet (25MG ) by mouth every morning and 2 tablets (50MG ) by mouth every night 10/16/17  Yes [provider]  cholecalciferol (VITAMIN D) 1000 units tablet Take 5,000 Units by mouth daily.   Yes [provider]  cyclobenzaprine (FLEXERIL) 10 MG tablet Take 10 mg by mouth 3 (three) times daily as needed for muscle pain. 03/01/18  Yes [provider]  ezetimibe (ZETIA) 10 MG tablet Take 10 mg by mouth daily. 10/14/17  Yes [provider]  famotidine (PEPCID) 20 MG tablet Take 20 mg by mouth daily. 09/14/17  Yes [provider]  FLUoxetine (PROZAC) 20 MG capsule Take 60 mg by mouth at bedtime.  12/04/17  Yes [provider]  hydrALAZINE (APRESOLINE) 25 MG tablet Take 25 mg by mouth daily.   Yes [provider]  insulin aspart (NOVOLOG) 100 UNIT/ML injection Inject 15 Units into the skin 3 (three) times daily with meals. 12/20/17  Yes Sainani, Belia Heman, MD  insulin detemir (LEVEMIR) 100 UNIT/ML injection Inject 0.4 mLs (40 Units total) into the skin 2 (two) times daily. 12/20/17  Yes Henreitta Leber, MD  nitroGLYCERIN (NITROSTAT) 0.4 MG SL tablet Place 0.4 mg under the tongue every 5 (five) minutes as needed for chest pain.   Yes [provider]  RENVELA 800 MG tablet Take 1,600 mg by mouth 3 (three) times daily with meals. 10/14/17  Yes [provider]  traMADol (ULTRAM) 50 MG tablet Take 1 tablet (50 mg total) by mouth every 6 (six) hours as needed. 12/20/17  Yes Sainani, Belia Heman, MD  VOLTAREN 1 % GEL Apply 2 g topically 3 (three) times  daily. 12/04/17  Yes [provider]  warfarin (COUMADIN) 6 MG tablet Take 24 mg by mouth every evening.  10/16/17  Yes [provider]    Inpatient Medications: Scheduled Meds: . [START ON 03/10/2018] allopurinol  50 mg Oral QODAY  . amiodarone  200 mg Oral Daily  . amLODipine  10 mg Oral Daily  . [START ON 03/10/2018] aspirin  81 mg Oral Pre-Cath  . atorvastatin  80 mg Oral QPM  . bumetanide  2 mg Oral Daily  . carvedilol  25 mg Oral q morning - 10a   And  . carvedilol  50 mg Oral QHS  . cholecalciferol  5,000 Units Oral Daily  . diclofenac sodium  2 g Topical TID  . ezetimibe  10 mg Oral Daily  . famotidine  20 mg Oral Daily  . FLUoxetine  60 mg Oral QHS  . hydrALAZINE  25 mg Oral Daily  . insulin aspart  0-20 Units Subcutaneous TID WC  . insulin aspart  0-5 Units Subcutaneous QHS  . insulin aspart  15 Units Subcutaneous TID WC  . insulin detemir  40 Units Subcutaneous BID  . nitroGLYCERIN  1 inch Topical Q6H  . sevelamer carbonate  1,600 mg Oral TID WC  . sodium chloride flush  3 mL Intravenous Q12H  . sodium chloride flush  3 mL Intravenous Q12H  . ticagrelor  90 mg Oral BID  . warfarin  24 mg Oral QPM   Continuous Infusions: . sodium chloride    . sodium chloride    . sodium chloride 50 mL/hr at 03/09/18 0945  . heparin 1,650 Units/hr (03/09/18 0544)   PRN Meds: sodium chloride, acetaminophen, ALPRAZolam, cyclobenzaprine, hydrALAZINE, metoprolol tartrate, ondansetron (ZOFRAN) IV, sodium chloride flush, traMADol  Allergies:    Allergies  Allergen Reactions  . Morphine And Related     Social History:   Social History   Tobacco Use  . 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 Smokeless tobacco: Current User    Types: Snuff  Substance Use Topics  . Alcohol use: Never    Frequency: Never  . Drug use: Never    Family History:   Family History  Problem Relation Age of Onset  . Diabetes Mother   . Peripheral vascular disease Mother   . COPD Father        ROS:  Please see the history of present illness. All other ROS reviewed and negative.     Physical Exam/Data:   Vitals:   03/09/18 0428 03/09/18 0745 03/09/18 0745 03/09/18 1041  BP: 119/74 121/68 121/68   Pulse: 73 74 72   Resp:      Temp: 98 F (36.7 C) 98.3 F (36.8 C) 98.3 F (36.8 C)   TempSrc: Oral Oral Oral   SpO2: 97% 99% 99%   Weight:    (!) 152 kg  Height:        Intake/Output Summary (Last 24 hours) at 03/09/2018 1049 Last data filed at 03/09/2018 0938 Gross per 24 hour  Intake 156.11 ml  Output 800 ml  Net -643.89 ml   Filed Weights   03/08/18 1819 03/08/18 2226 03/09/18 1041  Weight: (!) 147.4 kg (!) 151.3 kg (!) 152 kg   Body mass index is 41.9 kg/m.  General: Morbidly obese man, lying comfortably in bed.  He is accompanied by his wife. HEENT: normal Lymph: no adenopathy Neck: no JVD Endocrine:  No thryomegaly Vascular: No carotid bruits; 2+ radial pulses bilaterally. Cardiac: Heart sounds but regular without murmurs or rubs. Lungs:  clear to auscultation bilaterally, no wheezing, rhonchi or rales  Abd: soft, nontender.  Obese.  Unable to assess HSM due to body habitus. Ext: no edema Musculoskeletal: No lower extremity edema. Skin: warm and dry  Neuro:  CNs 2-12 intact. Psych:  Normal affect   EKG:  The EKG was personally reviewed and demonstrates: Normal sinus rhythm with borderline LVH and nonspecific T wave abnormality. Telemetry:  Telemetry was personally reviewed and demonstrates: Normal sinus rhythm and atrial paced rhythm.  Relevant CV Studies: LHC/PCI (03/27/17 [facility]): 1. Significant 2-vessel coronary artery disease.There is a 90% stenosis  of the proximal LAD with TIMI 1 flow into the distal vessel. There is a  70% stenosis of the ostial DIAG2 vessel which was treated with balloon  angiogplasty. There is a 70% stenosis of the proximal LCx and there is  mild-moderate RCA disease.  2. Decreased LV Function by previous echoEF  estimated 30 %.LVEDP 35 mm  Hg 3. PCI was performed of the proximal and mid 90% LAD lesion using Resolute  Onyx (drug eluting stent) x 2. Good  angiographic result with TIMI 3 flow 4. Impella was placed for cardiogenic shock prior to PCI 5. Right heart cath after Impella placement- RA mean 11, RV 40/8,PA  41/21 mean 29,PCWP mean 12 6. PA sat 58, Fick CO/CI 4.6/1.7   Echo (04/08/17 [facility]):  Mildly decreased left ventricular systolic function, ejection fraction  45%  Dilated left atrium - mild  Normal right ventricular systolic function  Dilated right atrium - mild  No significant valvular abnormalities  Technically difficult study due to chest wall/lung interference  Ultrasound enhancing agent utilized to improve endocardial border  definition  Laboratory Data:  Chemistry Recent Labs  Lab 03/08/18 1822 03/09/18 0805  NA 132* 134*  K 4.1 4.2  CL 103 107  CO2 19* 19*  GLUCOSE 433* 202*  BUN 29* 29*  CREATININE 2.77* 2.78*  CALCIUM 8.8* 8.1*  GFRNONAA 26* 26*  GFRAA 30* 30*  ANIONGAP 10 8    No results for input(s): PROT, ALBUMIN, AST, ALT, ALKPHOS, BILITOT in the last 168 hours. Hematology Recent Labs  Lab 03/08/18 1822 03/09/18 0450  WBC 10.9* 9.7  RBC 4.90 4.03*  HGB 13.0 10.5*  HCT 40.6 33.7*  MCV 82.9 83.6  MCH 26.5 26.1  MCHC 32.0 31.2  RDW 15.7* 15.9*  PLT 247 202   Cardiac Enzymes Recent Labs  Lab 03/08/18 1822 03/08/18 2308 03/09/18 0139 03/09/18 0450  TROPONINI 0.59* 1.17* 1.38* 1.72*   No results for input(s): TROPIPOC in the last 168 hours.  BNPNo results for input(s): BNP, PROBNP in the last 168 hours.  DDimer No results for input(s): DDIMER in the last 168 hours.  Radiology/Studies:  Dg Chest 2 View  Result Date: 03/08/2018 CLINICAL DATA:  47 year old male with chest tightness and numbness in both arms beginning at 1700 hours. EXAM: CHEST - 2 VIEW COMPARISON:  Portable chest 12/16/2017. FINDINGS: Chronic left chest cardiac AICD  and chronic left rib fractures. Lung volumes and mediastinal contours within normal limits. Both lungs appear clear. No pneumothorax or pleural effusion. Visualized tracheal air column is within normal limits. No acute osseous abnormality identified. Paucity of bowel gas in the upper abdomen. IMPRESSION: No acute cardiopulmonary abnormality. Electronically Signed   By: Genevie Ann M.D.   On: 03/08/2018 18:53    Assessment and Plan:   NSTEMI Given the patient's history and acute onset of chest pain with mild troponin elevation, I am concerned for worsening coronary insufficiency and myocardial ischemia.  This could be complicated by subtherapeutic INR, though almost 12 months out from his PCI and continued ticagrelor use, I think late stent thrombosis is unlikely.  Catheterization last year noted residual disease involving the proximal LCx.  Catheterization is complicated by chronic kidney disease, though creatinine appears to be at his baseline.  Plan for cardiac catheterization and possible PCI later today.  If PCI is indicated, this Sickles be staged in the setting of CKD unless critical, unstable lesion is identified.  Continue heparin and ticagrelor.  Recommend aspirin 81 mg daily as well until anticoagulation is therapeutic.  It is unclear why the patient is on ticagrelor and not clopidogrel in the setting of chronic warfarin use.  Aggressive secondary prevention.  Gentle hydration for at least 6 hours in anticipation of LHC this afternoon.  Chronic systolic heart failure Edwin [name] appears euvolemic on exam.  Symptoms are more consistent with ACS rather than heart failure.  Obtain echocardiogram.  Continue labetalol.  Defer adding ACE inhibitor/ARB/aldosterone antagonist in the setting of advanced CKD.  Though  presentation is not consistent with acute arrhythmogenic event, we will have his device interrogated, as the patient reports some palpitations associated with his chest pain  yesterday.  Paroxysmal atrial fibrillation Sinus rhythm and atrial pacing noted during admission.  Continue carvedilol and heparin.  Plan to restart warfarin after catheterization.  Continue amiodarone.  Chronic kidney disease stage IV Creatinine appears to be near baseline.  Gentle hydration in anticipation of catheterization later today.  Consider consultation with nephrology.  Avoid nephrotoxic agents.  Hypertension Blood pressure well controlled this morning, though it was quite high when the patient first arrived.  Continue carvedilol, amlodipine, and hydralazine.    For questions or updates, please contact [place] Please consult [email or web address] for contact info under [facility].  Signed, Nelva Bush, MD  03/09/2018 10:49 AM

## 2018-03-09 NOTE — H&P (View-Only) (Signed)
Cardiology Consultation:   Patient ID: Edwin [name] MRN: [id number]; DOB: 01/23/1971  Admit date: 03/08/2018 Date of Consult: 03/09/2018  Primary Care Provider: Patient, No Pcp Per Primary Cardiologist: Kerry Dory, MD - [facility] Primary Electrophysiologist:  Stark Klein, MD - [facility]   Patient Profile:   Edwin [name] is a 47 y.o. male with a hx of coronary artery disease with an STEMI in [id number] complicated by cardiogenic shock following cardiac arrest with ventricular tachycardia.  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 Posey Pronto.  History of Present Illness:   Edwin [name] had a lengthy hospitalization at [facility] beginning almost 1 year ago.  He presented to [facility] with acute onset of chest pain and was subsequently transferred to [facility].  He had cardiac arrest secondary to ventricular tachycardia with evidence of cardiogenic shock.  He underwent Impella CP placement and PCI to subtotally occluded mid LAD with 2 overlapping drug-eluting stents.  He remained in cardiogenic shock and subsequently underwent ECMO cannulation.  Vascular injury occurred during this procedure with residual left foot drop.  He also had acute on chronic kidney injury requiring temporary hemodialysis.  He subsequently followed with Dr. Denman George at [facility], having last been seen in April.  He also went ICD placement for secondary prevention.  Edwin [name]  was in his usual state of health until yesterday afternoon, when he had sudden onset of substernal chest pressure.  It was less severe and somewhat different than what he experienced leading up to his MI last year.  The pain lasted about an hour and was accompanied by shortness of breath, nausea, and vomiting.  He felt some vague palpitations but does not believe that his ICD shocked him.  He has been compliant with his medications including ticagrelor and warfarin (though INR was 0.9 on arrival here).  Other than weakness related to his foot drop, he is back to baseline in regard to his mobility and is able to perform all activities of daily living.  Upon arrival, Edwin [name] was noted to have a mild troponin elevation, which has continued to rise (most recently 1.7).  Creatinine is near baseline at 2.8.  Past Medical History:  Diagnosis Date  . CAD (coronary artery disease)   . Chronic systolic heart failure ([place])   . Diabetes mellitus without complication ([place])   . Heart attack ([place])   . Hyperlipidemia   . Hypertension   . Ischemic cardiomyopathy   . Paroxysmal atrial fibrillation [facility])     Past Surgical History:  Procedure Laterality Date  . CARDIAC SURGERY    . IRRIGATION AND DEBRIDEMENT KNEE Left 12/17/2017   Procedure: LEFT KNEE DEBRIDEMENT AND SYNOVECTOMY;  Surgeon: Thornton Park, MD;  Location: [facility] ORS;  Service: Orthopedics;  Laterality: Left;  . JOINT REPLACEMENT    . PERCUTANEOUS CORONARY STENT INTERVENTION (PCI-S)       Home Medications:  Prior to Admission medications   Medication Sig Start Date Johncarlo Maalouf Date Taking? Authorizing Provider  allopurinol (ZYLOPRIM) 100 MG tablet Take 0.5 tablets (50 mg  total) by mouth every other day. 12/21/17 03/08/18 Yes Sainani, Belia Heman, MD  amiodarone (PACERONE) 200 MG tablet Take 200 mg by mouth daily. 09/14/17  Yes [provider]  amLODipine (NORVASC) 10 MG tablet Take 10 mg by mouth daily. 10/16/17  Yes [provider]    atorvastatin (LIPITOR) 80 MG tablet Take 80 mg by mouth every evening.  09/14/17  Yes [provider]  BRILINTA 90 MG TABS tablet Take 90 mg by mouth 2 (two) times daily. 09/14/17  Yes [provider]  bumetanide (BUMEX) 2 MG tablet Take 2 mg by mouth daily. 09/14/17  Yes [provider]  carvedilol (COREG) 25 MG tablet Take 25-50 mg by mouth See admin instructions. Take 1 tablet (25MG ) by mouth every morning and 2 tablets (50MG ) by mouth every night 10/16/17  Yes [provider]  cholecalciferol (VITAMIN D) 1000 units tablet Take 5,000 Units by mouth daily.   Yes [provider]  cyclobenzaprine (FLEXERIL) 10 MG tablet Take 10 mg by mouth 3 (three) times daily as needed for muscle pain. 03/01/18  Yes [provider]  ezetimibe (ZETIA) 10 MG tablet Take 10 mg by mouth daily. 10/14/17  Yes [provider]  famotidine (PEPCID) 20 MG tablet Take 20 mg by mouth daily. 09/14/17  Yes [provider]  FLUoxetine (PROZAC) 20 MG capsule Take 60 mg by mouth at bedtime.  12/04/17  Yes [provider]  hydrALAZINE (APRESOLINE) 25 MG tablet Take 25 mg by mouth daily.   Yes [provider]  insulin aspart (NOVOLOG) 100 UNIT/ML injection Inject 15 Units into the skin 3 (three) times daily with meals. 12/20/17  Yes Sainani, Belia Heman, MD  insulin detemir (LEVEMIR) 100 UNIT/ML injection Inject 0.4 mLs (40 Units total) into the skin 2 (two) times daily. 12/20/17  Yes Henreitta Leber, MD  nitroGLYCERIN (NITROSTAT) 0.4 MG SL tablet Place 0.4 mg under the tongue every 5 (five) minutes as needed for chest pain.   Yes [provider]  RENVELA 800 MG tablet Take 1,600 mg by mouth 3 (three) times daily with meals. 10/14/17  Yes [provider]  traMADol (ULTRAM) 50 MG tablet Take 1 tablet (50 mg total) by mouth every 6 (six) hours as needed. 12/20/17  Yes Sainani, Belia Heman, MD  VOLTAREN 1 % GEL Apply 2 g topically 3 (three) times  daily. 12/04/17  Yes [provider]  warfarin (COUMADIN) 6 MG tablet Take 24 mg by mouth every evening.  10/16/17  Yes [provider]    Inpatient Medications: Scheduled Meds: . [START ON 03/10/2018] allopurinol  50 mg Oral QODAY  . amiodarone  200 mg Oral Daily  . amLODipine  10 mg Oral Daily  . [START ON 03/10/2018] aspirin  81 mg Oral Pre-Cath  . atorvastatin  80 mg Oral QPM  . bumetanide  2 mg Oral Daily  . carvedilol  25 mg Oral q morning - 10a   And  . carvedilol  50 mg Oral QHS  . cholecalciferol  5,000 Units Oral Daily  . diclofenac sodium  2 g Topical TID  . ezetimibe  10 mg Oral Daily  . famotidine  20 mg Oral Daily  . FLUoxetine  60 mg Oral QHS  . hydrALAZINE  25 mg Oral Daily  . insulin aspart  0-20 Units Subcutaneous TID WC  . insulin aspart  0-5 Units Subcutaneous QHS  . insulin aspart  15 Units Subcutaneous TID WC  . insulin detemir  40 Units Subcutaneous BID  . nitroGLYCERIN  1 inch Topical Q6H  . sevelamer carbonate  1,600 mg Oral TID WC  . sodium chloride flush  3 mL Intravenous Q12H  . sodium chloride flush  3 mL Intravenous Q12H  . ticagrelor  90 mg Oral BID  . warfarin  24 mg Oral QPM   Continuous Infusions: . sodium chloride    . sodium chloride    . sodium chloride 50 mL/hr at 03/09/18 0945  . heparin 1,650 Units/hr (03/09/18 0544)   PRN Meds: sodium chloride, acetaminophen, ALPRAZolam, cyclobenzaprine, hydrALAZINE, metoprolol tartrate, ondansetron (ZOFRAN) IV, sodium chloride flush, traMADol  Allergies:    Allergies  Allergen Reactions  . Morphine And Related     Social History:   Social History   Tobacco Use  . 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 Smokeless tobacco: Current User    Types: Snuff  Substance Use Topics  . Alcohol use: Never    Frequency: Never  . Drug use: Never    Family History:   Family History  Problem Relation Age of Onset  . Diabetes Mother   . Peripheral vascular disease Mother   . COPD Father        ROS:  Please see the history of present illness. All other ROS reviewed and negative.     Physical Exam/Data:   Vitals:   03/09/18 0428 03/09/18 0745 03/09/18 0745 03/09/18 1041  BP: 119/74 121/68 121/68   Pulse: 73 74 72   Resp:      Temp: 98 F (36.7 C) 98.3 F (36.8 C) 98.3 F (36.8 C)   TempSrc: Oral Oral Oral   SpO2: 97% 99% 99%   Weight:    (!) 152 kg  Height:        Intake/Output Summary (Last 24 hours) at 03/09/2018 1049 Last data filed at 03/09/2018 0938 Gross per 24 hour  Intake 156.11 ml  Output 800 ml  Net -643.89 ml   Filed Weights   03/08/18 1819 03/08/18 2226 03/09/18 1041  Weight: (!) 147.4 kg (!) 151.3 kg (!) 152 kg   Body mass index is 41.9 kg/m.  General: Morbidly obese man, lying comfortably in bed.  He is accompanied by his wife. HEENT: normal Lymph: no adenopathy Neck: no JVD Endocrine:  No thryomegaly Vascular: No carotid bruits; 2+ radial pulses bilaterally. Cardiac: Heart sounds but regular without murmurs or rubs. Lungs:  clear to auscultation bilaterally, no wheezing, rhonchi or rales  Abd: soft, nontender.  Obese.  Unable to assess HSM due to body habitus. Ext: no edema Musculoskeletal: No lower extremity edema. Skin: warm and dry  Neuro:  CNs 2-12 intact. Psych:  Normal affect   EKG:  The EKG was personally reviewed and demonstrates: Normal sinus rhythm with borderline LVH and nonspecific T wave abnormality. Telemetry:  Telemetry was personally reviewed and demonstrates: Normal sinus rhythm and atrial paced rhythm.  Relevant CV Studies: LHC/PCI (03/27/17 [facility]): 1. Significant 2-vessel coronary artery disease.There is a 90% stenosis  of the proximal LAD with TIMI 1 flow into the distal vessel. There is a  70% stenosis of the ostial DIAG2 vessel which was treated with balloon  angiogplasty. There is a 70% stenosis of the proximal LCx and there is  mild-moderate RCA disease.  2. Decreased LV Function by previous echoEF  estimated 30 %.LVEDP 35 mm  Hg 3. PCI was performed of the proximal and mid 90% LAD lesion using Resolute  Onyx (drug eluting stent) x 2. Good  angiographic result with TIMI 3 flow 4. Impella was placed for cardiogenic shock prior to PCI 5. Right heart cath after Impella placement- RA mean 11, RV 40/8,PA  41/21 mean 29,PCWP mean 12 6. PA sat 58, Fick CO/CI 4.6/1.7   Echo (04/08/17 [facility]):  Mildly decreased left ventricular systolic function, ejection fraction  45%  Dilated left atrium - mild  Normal right ventricular systolic function  Dilated right atrium - mild  No significant valvular abnormalities  Technically difficult study due to chest wall/lung interference  Ultrasound enhancing agent utilized to improve endocardial border  definition  Laboratory Data:  Chemistry Recent Labs  Lab 03/08/18 1822 03/09/18 0805  NA 132* 134*  K 4.1 4.2  CL 103 107  CO2 19* 19*  GLUCOSE 433* 202*  BUN 29* 29*  CREATININE 2.77* 2.78*  CALCIUM 8.8* 8.1*  GFRNONAA 26* 26*  GFRAA 30* 30*  ANIONGAP 10 8    No results for input(s): PROT, ALBUMIN, AST, ALT, ALKPHOS, BILITOT in the last 168 hours. Hematology Recent Labs  Lab 03/08/18 1822 03/09/18 0450  WBC 10.9* 9.7  RBC 4.90 4.03*  HGB 13.0 10.5*  HCT 40.6 33.7*  MCV 82.9 83.6  MCH 26.5 26.1  MCHC 32.0 31.2  RDW 15.7* 15.9*  PLT 247 202   Cardiac Enzymes Recent Labs  Lab 03/08/18 1822 03/08/18 2308 03/09/18 0139 03/09/18 0450  TROPONINI 0.59* 1.17* 1.38* 1.72*   No results for input(s): TROPIPOC in the last 168 hours.  BNPNo results for input(s): BNP, PROBNP in the last 168 hours.  DDimer No results for input(s): DDIMER in the last 168 hours.  Radiology/Studies:  Dg Chest 2 View  Result Date: 03/08/2018 CLINICAL DATA:  47 year old male with chest tightness and numbness in both arms beginning at 1700 hours. EXAM: CHEST - 2 VIEW COMPARISON:  Portable chest 12/16/2017. FINDINGS: Chronic left chest cardiac AICD  and chronic left rib fractures. Lung volumes and mediastinal contours within normal limits. Both lungs appear clear. No pneumothorax or pleural effusion. Visualized tracheal air column is within normal limits. No acute osseous abnormality identified. Paucity of bowel gas in the upper abdomen. IMPRESSION: No acute cardiopulmonary abnormality. Electronically Signed   By: Genevie Ann M.D.   On: 03/08/2018 18:53    Assessment and Plan:   NSTEMI Given the patient's history and acute onset of chest pain with mild troponin elevation, I am concerned for worsening coronary insufficiency and myocardial ischemia.  This could be complicated by subtherapeutic INR, though almost 12 months out from his PCI and continued ticagrelor use, I think late stent thrombosis is unlikely.  Catheterization last year noted residual disease involving the proximal LCx.  Catheterization is complicated by chronic kidney disease, though creatinine appears to be at his baseline.  Plan for cardiac catheterization and possible PCI later today.  If PCI is indicated, this K[place]mes be staged in the setting of CKD unless critical, unstable lesion is identified.  Continue heparin and ticagrelor.  Recommend aspirin 81 mg daily as well until anticoagulation is therapeutic.  It is unclear why the patient is on ticagrelor and not clopidogrel in the setting of chronic warfarin use.  Aggressive secondary prevention.  Gentle hydration for at least 6 hours in anticipation of LHC this afternoon.  Chronic systolic heart failure Mr. Luckadoo appears euvolemic on exam.  Symptoms are more consistent with ACS rather than heart failure.  Obtain echocardiogram.  Continue labetalol.  Defer adding ACE inhibitor/ARB/aldosterone antagonist in the setting of advanced CKD.  Though  presentation is not consistent with acute arrhythmogenic event, we will have his device interrogated, as the patient reports some palpitations associated with his chest pain  yesterday.  Paroxysmal atrial fibrillation Sinus rhythm and atrial pacing noted during admission.  Continue carvedilol and heparin.  Plan to restart warfarin after catheterization.  Continue amiodarone.  Chronic kidney disease stage IV Creatinine appears to be near baseline.  Gentle hydration in anticipation of catheterization later today.  Consider consultation with nephrology.  Avoid nephrotoxic agents.  Hypertension Blood pressure well controlled this morning, though it was quite high when the patient first arrived.  Continue carvedilol, amlodipine, and hydralazine.    For questions or updates, please contact [place] Please consult [email or web address] for contact info under [facility].  Signed, Nelva Bush, MD  03/09/2018 10:49 AM

## 2018-03-09 NOTE — Plan of Care (Signed)
  Problem: Education: Goal: Knowledge of General Education information will improve Description Including pain rating scale, medication(s)/side effects and non-pharmacologic comfort measures Outcome: Progressing   Problem: Pain Managment: Goal: General experience of comfort will improve Outcome: Progressing   Problem: Education: Goal: Understanding of cardiac disease, CV risk reduction, and recovery process will improve Outcome: Progressing Goal: Understanding of medication regimen will improve Outcome: Progressing Goal: Individualized Educational Video(s) Outcome: Progressing   Problem: Education: Goal: Understanding of CV disease, CV risk reduction, and recovery process will improve Outcome: Progressing Goal: Individualized Educational Video(s) Outcome: Progressing   Problem: Activity: Goal: Ability to return to baseline activity level will improve Outcome: Progressing   Problem: Cardiovascular: Goal: Ability to achieve and maintain adequate cardiovascular perfusion will improve Outcome: Progressing Goal: Vascular access site(s) Level 0-1 will be maintained Outcome: Progressing   Problem: Health Behavior/Discharge Planning: Goal: Ability to safely manage health-related needs after discharge will improve Outcome: Progressing

## 2018-03-09 NOTE — Progress Notes (Signed)
[place] for Heparin  Indication: chest pain/ACS  Allergies  Allergen Reactions  . Morphine And Related     Patient Measurements: Height: 6\' 3"  (190.5 cm) Weight: (!) 333 lb 9.6 oz (151.3 kg) IBW/kg (Calculated) : 84.5 Heparin Dosing Weight: 118.2 kg   Vital Signs: Temp: 98.3 F (36.8 C) (12/10 0745) Temp Source: Oral (12/10 0745) BP: 121/68 (12/10 0745) Pulse Rate: 72 (12/10 0745)  Labs: Recent Labs    03/08/18 1822 03/08/18 2308 03/09/18 0139 03/09/18 0450 03/09/18 0805  HGB 13.0  --   --  10.5*  --   HCT 40.6  --   --  33.7*  --   PLT 247  --   --  202  --   LABPROT 12.1  --   --   --   --   INR 0.90  --   --   --   --   HEPARINUNFRC  --   --  <0.10*  --  0.30  CREATININE 2.77*  --   --   --  2.78*  TROPONINI 0.59* 1.17* 1.38* 1.72*  --     Estimated Creatinine Clearance: 50.6 mL/min (A) (by C-G formula based on SCr of 2.78 mg/dL (H)).   Medical History: Past Medical History:  Diagnosis Date  . CAD (coronary artery disease)   . Diabetes mellitus without complication ([place])   . Heart attack ([place])   . Hyperlipidemia   . Hypertension   . Paroxysmal atrial fibrillation ([name])     Medications:  Medications Prior to Admission  Medication Sig Dispense Refill Last Dose  . allopurinol (ZYLOPRIM) 100 MG tablet Take 0.5 tablets (50 mg total) by mouth every other day. 7.5 tablet 1 03/08/2018 at 0800  . amiodarone (PACERONE) 200 MG tablet Take 200 mg by mouth daily.   03/08/2018 at 0800  . amLODipine (NORVASC) 10 MG tablet Take 10 mg by mouth daily.   03/08/2018 at 0800  . atorvastatin (LIPITOR) 80 MG tablet Take 80 mg by mouth every evening.    03/07/2018 at 2000  . BRILINTA 90 MG TABS tablet Take 90 mg by mouth 2 (two) times daily.   03/08/2018 at 0800  . bumetanide (BUMEX) 2 MG tablet Take 2 mg by mouth daily.   03/08/2018 at 0800  . carvedilol (COREG) 25 MG tablet Take 25-50 mg by mouth See admin instructions. Take 1 tablet  (25MG ) by mouth every morning and 2 tablets (50MG ) by mouth every night   03/08/2018 at 0800  . cholecalciferol (VITAMIN D) 1000 units tablet Take 5,000 Units by mouth daily.   03/08/2018 at 0800  . cyclobenzaprine (FLEXERIL) 10 MG tablet Take 10 mg by mouth 3 (three) times daily as needed for muscle pain.   Past Week at PRN  . ezetimibe (ZETIA) 10 MG tablet Take 10 mg by mouth daily.   03/08/2018 at 0800  . famotidine (PEPCID) 20 MG tablet Take 20 mg by mouth daily.   03/08/2018 at 0800  . FLUoxetine (PROZAC) 20 MG capsule Take 60 mg by mouth at bedtime.    03/07/2018 at 2000  . hydrALAZINE (APRESOLINE) 25 MG tablet Take 25 mg by mouth daily.   03/08/2018 at 0800  . insulin aspart (NOVOLOG) 100 UNIT/ML injection Inject 15 Units into the skin 3 (three) times daily with meals. 10 mL 11 03/08/2018 at 0800  . insulin detemir (LEVEMIR) 100 UNIT/ML injection Inject 0.4 mLs (40 Units total) into the skin 2 (two) times daily.  10 mL 11 03/08/2018 at 0800  . nitroGLYCERIN (NITROSTAT) 0.4 MG SL tablet Place 0.4 mg under the tongue every 5 (five) minutes as needed for chest pain.   Unknown at PRN  . RENVELA 800 MG tablet Take 1,600 mg by mouth 3 (three) times daily with meals.   03/08/2018 at 0800  . traMADol (ULTRAM) 50 MG tablet Take 1 tablet (50 mg total) by mouth every 6 (six) hours as needed. 30 tablet 0 Past Week at PRN  . VOLTAREN 1 % GEL Apply 2 g topically 3 (three) times daily.   03/08/2018 at 0800  . warfarin (COUMADIN) 6 MG tablet Take 24 mg by mouth every evening.    03/07/2018 at 2000    Assessment: Pharmacy consulted to dose heparin in this 47 year old male with NSTEMI.  Pt was on warfarin previously but subtherapeutic INR.  Troponins are still trending upward. Hgb trending down slightly but no reports of bleeding (13>10.5). I spoke with the patient and confirmed that he takes #4 6mg  warfarin tablets. He admits to no missed doses but INR is less than 1.0.  Heparin Course: 12/09: initiation bolus 4000  units then 1500 units/hr 12/10 0130 HL<0.10: bolus 3600 units, then inc to 1650 units/hr 12/10 0805 HL 0.30   Goal of Therapy:  Heparin level 0.3-0.7 units/ml Monitor platelets by anticoagulation protocol: Yes   Plan:  Heparin level is therapeutic but at the very low end of the range. Will increase rate to 1800 units/hr and will recheck HL at 1800. Will continue to monitor CBC.  Vallery Sa, PharmD Clinical Pharmacist 03/09/2018

## 2018-03-09 NOTE — Progress Notes (Signed)
   [place] contacted at [employer] 03/09/18 and pending representative to come out for interrogation check of cardiac device.  Marrianne Mood, PA-C

## 2018-03-09 NOTE — Progress Notes (Signed)
[place] for Heparin  Indication: chest pain/ACS  Allergies  Allergen Reactions  . Morphine And Related     Overdose in hospital per patient report    Patient Measurements: Height: 6\' 3"  (190.5 cm) Weight: (!) 335 lb 3.2 oz (152 kg) IBW/kg (Calculated) : 84.5 Heparin Dosing Weight: 118.2 kg   Vital Signs: Temp: 98 F (36.7 C) (12/10 1502) Temp Source: Oral (12/10 1502) BP: 127/89 (12/10 1801) Pulse Rate: 70 (12/10 1801)  Labs: Recent Labs    03/08/18 1822 03/08/18 2308 03/09/18 0139 03/09/18 0450 03/09/18 0805  HGB 13.0  --   --  10.5*  --   HCT 40.6  --   --  33.7*  --   PLT 247  --   --  202  --   LABPROT 12.1  --   --   --   --   INR 0.90  --   --   --   --   HEPARINUNFRC  --   --  <0.10*  --  0.30  CREATININE 2.77*  --   --   --  2.78*  TROPONINI 0.59* 1.17* 1.38* 1.72*  --     Estimated Creatinine Clearance: 50.7 mL/min (A) (by C-G formula based on SCr of 2.78 mg/dL (H)).   Medical History: Past Medical History:  Diagnosis Date  . CAD (coronary artery disease)   . Chronic systolic heart failure ([place])   . Diabetes mellitus without complication ([place])   . Heart attack ([place])   . Hyperlipidemia   . Hypertension   . Ischemic cardiomyopathy   . Paroxysmal atrial fibrillation ([name])     Medications:  Medications Prior to Admission  Medication Sig Dispense Refill Last Dose  . allopurinol (ZYLOPRIM) 100 MG tablet Take 0.5 tablets (50 mg total) by mouth every other day. 7.5 tablet 1 03/08/2018 at 0800  . amiodarone (PACERONE) 200 MG tablet Take 200 mg by mouth daily.   03/08/2018 at 0800  . amLODipine (NORVASC) 10 MG tablet Take 10 mg by mouth daily.   03/08/2018 at 0800  . atorvastatin (LIPITOR) 80 MG tablet Take 80 mg by mouth every evening.    03/07/2018 at 2000  . BRILINTA 90 MG TABS tablet Take 90 mg by mouth 2 (two) times daily.   03/08/2018 at 0800  . bumetanide (BUMEX) 2 MG tablet Take 2 mg by mouth daily.   03/08/2018  at 0800  . carvedilol (COREG) 25 MG tablet Take 25-50 mg by mouth See admin instructions. Take 1 tablet (25MG ) by mouth every morning and 2 tablets (50MG ) by mouth every night   03/08/2018 at 0800  . cholecalciferol (VITAMIN D) 1000 units tablet Take 5,000 Units by mouth daily.   03/08/2018 at 0800  . cyclobenzaprine (FLEXERIL) 10 MG tablet Take 10 mg by mouth 3 (three) times daily as needed for muscle pain.   Past Week at PRN  . ezetimibe (ZETIA) 10 MG tablet Take 10 mg by mouth daily.   03/08/2018 at 0800  . famotidine (PEPCID) 20 MG tablet Take 20 mg by mouth daily.   03/08/2018 at 0800  . FLUoxetine (PROZAC) 20 MG capsule Take 60 mg by mouth at bedtime.    03/07/2018 at 2000  . hydrALAZINE (APRESOLINE) 25 MG tablet Take 25 mg by mouth daily.   03/08/2018 at 0800  . insulin aspart (NOVOLOG) 100 UNIT/ML injection Inject 15 Units into the skin 3 (three) times daily with meals. 10 mL 11 03/08/2018 at  0800  . insulin detemir (LEVEMIR) 100 UNIT/ML injection Inject 0.4 mLs (40 Units total) into the skin 2 (two) times daily. 10 mL 11 03/08/2018 at 0800  . nitroGLYCERIN (NITROSTAT) 0.4 MG SL tablet Place 0.4 mg under the tongue every 5 (five) minutes as needed for chest pain.   Unknown at PRN  . RENVELA 800 MG tablet Take 1,600 mg by mouth 3 (three) times daily with meals.   03/08/2018 at 0800  . traMADol (ULTRAM) 50 MG tablet Take 1 tablet (50 mg total) by mouth every 6 (six) hours as needed. 30 tablet 0 Past Week at PRN  . VOLTAREN 1 % GEL Apply 2 g topically 3 (three) times daily.   03/08/2018 at 0800  . warfarin (COUMADIN) 6 MG tablet Take 24 mg by mouth every evening.    03/07/2018 at 2000    Assessment: Pharmacy consulted to dose heparin in this 47 year old male with NSTEMI.  Pt was on warfarin previously but subtherapeutic INR.  Troponins are still trending upward. Hgb trending down slightly but no reports of bleeding (13>10.5). I spoke with the patient and confirmed that he takes #4 6mg  warfarin tablets.  He admits to no missed doses but INR is less than 1.0.  Heparin Course: 12/09: initiation bolus 4000 units then 1500 units/hr 12/10 0130 HL<0.10: bolus 3600 units, then inc to 1650 units/hr 12/10 0805 HL 0.30  Patient went for Cath, to restart Heparin 2 hours post TR band removal. Band removed at 18:25.   Goal of Therapy:  Heparin level 0.3-0.7 units/ml Monitor platelets by anticoagulation protocol: Yes   Plan:  Will resume Heparin 1800 units/hr at 20:30 and will check HL in 6 hours. Will continue to monitor CBC.  Paulina Fusi, PharmD, BCPS 03/09/2018 6:44 PM

## 2018-03-09 NOTE — Progress Notes (Signed)
[facility] [facility] [place] NAME: Edwin [name]    MR#:  [id number]  DATE OF BIRTH:  [birth year]  SUBJECTIVE:   Came in with on on and off chest pain. Currently chest pain free. Wife in the room. REVIEW OF SYSTEMS:   Review of Systems  Constitutional: Negative for chills, fever and weight loss.  HENT: Negative for ear discharge, ear pain and nosebleeds.   Eyes: Negative for blurred vision, pain and discharge.  Respiratory: Negative for sputum production, shortness of breath, wheezing and stridor.   Cardiovascular: Positive for chest pain. Negative for palpitations, orthopnea and PND.  Gastrointestinal: Negative for abdominal pain, diarrhea, nausea and vomiting.  Genitourinary: Negative for frequency and urgency.  Musculoskeletal: Negative for back pain and joint pain.  Neurological: Negative for sensory change, speech change, focal weakness and weakness.  Psychiatric/Behavioral: Negative for depression and hallucinations. The patient is not nervous/anxious.    Tolerating Diet:npo Tolerating PT: not needed  DRUG ALLERGIES:   Allergies  Allergen Reactions  . Morphine And Related     Overdose in hospital per patient report    VITALS:  Blood pressure (!) 159/96, pulse 70, temperature 98 F (36.7 C), temperature source Oral, resp. rate 19, height 6\' 3"  (1.905 m), weight (!) 152 kg, SpO2 97 %.  PHYSICAL EXAMINATION:   Physical Exam  GENERAL:  47 y.o.-year-old patient lying in the bed with no acute distress. Obese EYES: Pupils equal, round, reactive to light and accommodation. No scleral icterus. Extraocular muscles intact.  HEENT: Head atraumatic, normocephalic. Oropharynx and nasopharynx clear.  NECK:  Supple, no jugular venous distention. No thyroid enlargement, no tenderness.  LUNGS: Normal breath sounds bilaterally, no wheezing, rales, rhonchi. No use of accessory muscles of respiration.  CARDIOVASCULAR: S1, S2 normal. No murmurs,  rubs, or gallops.  ABDOMEN: Soft, nontender, nondistended. Bowel sounds present. No organomegaly or mass. Morbid obeisty EXTREMITIES: No cyanosis, clubbing or edema b/l.    NEUROLOGIC: Cranial nerves II through XII are intact. No focal Motor or sensory deficits b/l.   PSYCHIATRIC:  patient is alert and oriented x 3.  SKIN: No obvious rash, lesion, or ulcer.   LABORATORY PANEL:  CBC Recent Labs  Lab 03/09/18 0450  WBC 9.7  HGB 10.5*  HCT 33.7*  PLT 202    Chemistries  Recent Labs  Lab 03/09/18 0805  NA 134*  K 4.2  CL 107  CO2 19*  GLUCOSE 202*  BUN 29*  CREATININE 2.78*  CALCIUM 8.1*   Cardiac Enzymes Recent Labs  Lab 03/09/18 0450  TROPONINI 1.72*   RADIOLOGY:  Dg Chest 2 View  Result Date: 03/08/2018 CLINICAL DATA:  47 year old male with chest tightness and numbness in both arms beginning at 1700 hours. EXAM: CHEST - 2 VIEW COMPARISON:  Portable chest 12/16/2017. FINDINGS: Chronic left chest cardiac AICD and chronic left rib fractures. Lung volumes and mediastinal contours within normal limits. Both lungs appear clear. No pneumothorax or pleural effusion. Visualized tracheal air column is within normal limits. No acute osseous abnormality identified. Paucity of bowel gas in the upper abdomen. IMPRESSION: No acute cardiopulmonary abnormality. Electronically Signed   By: Genevie Ann M.D.   On: 03/08/2018 18:53   ASSESSMENT AND PLAN:   Edwin [name]  is a 47 y.o. male with a known history per below presents emergency room with chest pain described as pressure radiating into his neck/left arm/shoulders, similar to last heart attack per patient, associated with emesis, shortness of breath, lasted  5 to 10 minutes, in the emergency room patient was noted to be tachycardic, hypertensive, sinus tachycardia noted on EKG   *Acute non-STEMI - Brilinta, aspirin, Coreg --cont IVheparin drip, supplemental oxygen PRN, scheduled nitroglycerin to chest, blood pressure control per below -  cardiology consultation with Dr end noted--pt to get cath today  * hyperglycemia with chronic diabetes mellitus type 2 -Continue home regiment, start high-dose sliding scale insulin with Accu-Cheks per routine, heart healthy/carbohydrate consistent diet -diabetic educator while in house  * malignant hypertension -Continue home regimen IV Lopressor 5 mg x1 now-schedule every 6 hours PRN, IV hydralazine PRN resistant hypertension, vitals per routine, make changes as per necessary -resumed home meds  *Chronic hyperlipidemia, unspecified -Continue statin therapy  *Chronic paroxysmal A. Fib -Continue amiodarone, beta-blocker therapy, not therapeutic on Coumadin (takes coumadin 24 mg q pm !!!)-currently on heparin drip  *Chronic diabetic and hypertensive nephropathy, chronic kidney disease stage IV Avoid nephrotoxic agents  Case discussed with Care Management/Social Worker. Management plans discussed with the patient, family and they are in agreement.  CODE STATUS: full  DVT Prophylaxis: heparin gtt  TOTAL TIME TAKING CARE OF THIS PATIENT: *30* minutes.  >50% time spent on counselling and coordination of care  POSSIBLE D/C IN *1-2* DAYS, DEPENDING ON CLINICAL CONDITION.  Note: This dictation was prepared with Dragon dictation along with smaller phrase technology. Any transcriptional errors that result from this process are unintentional.  Fritzi Mandes M.D on 03/09/2018 at 3:53 PM  Between 7am to 6pm - Pager - [phone]  After 6pm go to [email or web address] - password [email or web address] [facility]  Office  [phone]  CC: Primary care physician; Patient, No Pcp PerPatient ID: Edwin [name], male   DOB: 11/22/1970, 47 y.o.   MRN: [id number]

## 2018-03-09 NOTE — Progress Notes (Signed)
Joey with [employer] says dual chamber ICD check per [facility]. Device function normal. Lead function good. No episodes. I am the Charge Nurse today and was ask to type this note by [employer] rep.

## 2018-03-09 NOTE — Plan of Care (Signed)
Chest pain free since arrival to unit.  Prn med given for headache.  No distress noted.

## 2018-03-09 NOTE — Interval H&P Note (Signed)
History and Physical Interval Note:  03/09/2018 3:41 PM  Edwin [name]  has presented today for cardiac catheterization, with the diagnosis of NSTEMI. The various methods of treatment have been discussed with the patient and family. After consideration of risks, benefits and other options for treatment, the patient has consented to  Procedure(s): LEFT HEART CATH AND CORONARY ANGIOGRAPHY (Left) as a surgical intervention .  The patient's history has been reviewed, patient examined, no change in status, stable for surgery.  I have reviewed the patient's chart and labs.  Questions were answered to the patient's satisfaction.    Cath Lab Visit (complete for each Cath Lab visit)  Clinical Evaluation Leading to the Procedure:   ACS: Yes.    Non-ACS:  N/A  Janis Sol

## 2018-03-09 NOTE — Progress Notes (Signed)
ANTICOAGULATION CONSULT NOTE - Initial Consult  Pharmacy Consult for Heparin  Indication: chest pain/ACS  Allergies  Allergen Reactions  . Morphine And Related     Patient Measurements: Height: 6\' 3"  (190.5 cm) Weight: (!) 333 lb 9.6 oz (151.3 kg) IBW/kg (Calculated) : 84.5 Heparin Dosing Weight: 118.2 kg   Vital Signs: Temp: 98.2 F (36.8 C) (12/09 2226) Temp Source: Oral (12/09 2226) BP: 175/95 (12/09 2231) Pulse Rate: 79 (12/09 2231)  Labs: Recent Labs    03/08/18 1822 03/08/18 2308 03/09/18 0139  HGB 13.0  --   --   HCT 40.6  --   --   PLT 247  --   --   LABPROT 12.1  --   --   INR 0.90  --   --   HEPARINUNFRC  --   --  <0.10*  CREATININE 2.77*  --   --   TROPONINI 0.59* 1.17* 1.38*    Estimated Creatinine Clearance: 50.7 mL/min (A) (by C-G formula based on SCr of 2.77 mg/dL (H)).   Medical History: Past Medical History:  Diagnosis Date  . CAD (coronary artery disease)   . Diabetes mellitus without complication ([place])   . Heart attack ([place])   . Hyperlipidemia   . Hypertension   . Paroxysmal atrial fibrillation ([name])     Medications:  Medications Prior to Admission  Medication Sig Dispense Refill Last Dose  . allopurinol (ZYLOPRIM) 100 MG tablet Take 0.5 tablets (50 mg total) by mouth every other day. 7.5 tablet 1 03/08/2018 at 0800  . amiodarone (PACERONE) 200 MG tablet Take 200 mg by mouth daily.   03/08/2018 at 0800  . amLODipine (NORVASC) 10 MG tablet Take 10 mg by mouth daily.   03/08/2018 at 0800  . atorvastatin (LIPITOR) 80 MG tablet Take 80 mg by mouth every evening.    03/07/2018 at 2000  . BRILINTA 90 MG TABS tablet Take 90 mg by mouth 2 (two) times daily.   03/08/2018 at 0800  . bumetanide (BUMEX) 2 MG tablet Take 2 mg by mouth daily.   03/08/2018 at 0800  . carvedilol (COREG) 25 MG tablet Take 25-50 mg by mouth See admin instructions. Take 1 tablet (25MG ) by mouth every morning and 2 tablets (50MG ) by mouth every night   03/08/2018 at 0800  .  cholecalciferol (VITAMIN D) 1000 units tablet Take 5,000 Units by mouth daily.   03/08/2018 at 0800  . cyclobenzaprine (FLEXERIL) 10 MG tablet Take 10 mg by mouth 3 (three) times daily as needed for muscle pain.   Past Week at PRN  . ezetimibe (ZETIA) 10 MG tablet Take 10 mg by mouth daily.   03/08/2018 at 0800  . famotidine (PEPCID) 20 MG tablet Take 20 mg by mouth daily.   03/08/2018 at 0800  . FLUoxetine (PROZAC) 20 MG capsule Take 60 mg by mouth at bedtime.    03/07/2018 at 2000  . hydrALAZINE (APRESOLINE) 25 MG tablet Take 25 mg by mouth daily.   03/08/2018 at 0800  . insulin aspart (NOVOLOG) 100 UNIT/ML injection Inject 15 Units into the skin 3 (three) times daily with meals. 10 mL 11 03/08/2018 at 0800  . insulin detemir (LEVEMIR) 100 UNIT/ML injection Inject 0.4 mLs (40 Units total) into the skin 2 (two) times daily. 10 mL 11 03/08/2018 at 0800  . nitroGLYCERIN (NITROSTAT) 0.4 MG SL tablet Place 0.4 mg under the tongue every 5 (five) minutes as needed for chest pain.   Unknown at PRN  . RENVELA  800 MG tablet Take 1,600 mg by mouth 3 (three) times daily with meals.   03/08/2018 at 0800  . traMADol (ULTRAM) 50 MG tablet Take 1 tablet (50 mg total) by mouth every 6 (six) hours as needed. 30 tablet 0 Past Week at PRN  . VOLTAREN 1 % GEL Apply 2 g topically 3 (three) times daily.   03/08/2018 at 0800  . warfarin (COUMADIN) 6 MG tablet Take 24 mg by mouth every evening.    03/07/2018 at 2000    Assessment: Pharmacy consulted to dose heparin in this 47 year old male with NSTEMI.  Pt was on warfarin previously but subtherapeutic INR.  CrCl = 50.1 ml/min  Goal of Therapy:  Heparin level 0.3-0.7 units/ml Monitor platelets by anticoagulation protocol: Yes   Plan:  12/10 @ 0130 HL < 0.10 subtherapeutic. Will rebolus w/ heparin 3600 units IV x 1 and increase rate to 1650 units/hr and will recheck HL @ 0800. Will continue to monitor CBC.  Tobie Lords, PharmD, BCPS Clinical Pharmacist 03/09/2018

## 2018-03-10 ENCOUNTER — Encounter: Payer: Self-pay | Admitting: Internal Medicine

## 2018-03-10 ENCOUNTER — Inpatient Hospital Stay (HOSPITAL_COMMUNITY)
Admit: 2018-03-10 | Discharge: 2018-03-10 | Disposition: A | Discharge status: Home / Self Care | Payer: Medicare Other | Attending: Internal Medicine | Admitting: Internal Medicine

## 2018-03-10 DIAGNOSIS — I214 Non-ST elevation (NSTEMI) myocardial infarction: Secondary | ICD-10-CM

## 2018-03-10 LAB — BASIC METABOLIC PANEL
Anion gap: 7 (ref 5–15)
BUN: 30 mg/dL — ABNORMAL HIGH (ref 6–20)
CO2: 21 mmol/L — AB (ref 22–32)
Calcium: 8.3 mg/dL — ABNORMAL LOW (ref 8.9–10.3)
Chloride: 105 mmol/L (ref 98–111)
Creatinine, Ser: 2.97 mg/dL — ABNORMAL HIGH (ref 0.61–1.24)
GFR calc Af Amer: 27 mL/min — ABNORMAL LOW (ref >60)
GFR, EST NON AFRICAN AMERICAN: 24 mL/min — AB (ref >60)
Glucose, Bld: 207 mg/dL — ABNORMAL HIGH (ref 70–99)
Potassium: 4.6 mmol/L (ref 3.5–5.1)
Sodium: 133 mmol/L — ABNORMAL LOW (ref 135–145)

## 2018-03-10 LAB — CBC
HCT: 34.8 % — ABNORMAL LOW (ref 39.0–52.0)
Hemoglobin: 10.9 g/dL — ABNORMAL LOW (ref 13.0–17.0)
MCH: 26.5 pg (ref 26.0–34.0)
MCHC: 31.3 g/dL (ref 30.0–36.0)
MCV: 84.5 fL (ref 80.0–100.0)
Platelets: 200 10*3/uL (ref 150–400)
RBC: 4.12 MIL/uL — AB (ref 4.22–5.81)
RDW: 16.1 % — AB (ref 11.5–15.5)
WBC: 9.9 10*3/uL (ref 4.0–10.5)
nRBC: 0 % (ref 0.0–0.2)

## 2018-03-10 LAB — PROTIME-INR
INR: 1.2
Prothrombin Time: 15.1 seconds (ref 11.4–15.2)

## 2018-03-10 LAB — ECHOCARDIOGRAM COMPLETE
Height: 75 in
Weight: 5372.8 oz

## 2018-03-10 LAB — TROPONIN I: Troponin I: 0.73 ng/mL (ref <0.03)

## 2018-03-10 LAB — HEPARIN LEVEL (UNFRACTIONATED)
HEPARIN UNFRACTIONATED: 0.38 [IU]/mL (ref 0.30–0.70)
Heparin Unfractionated: 0.21 IU/mL — ABNORMAL LOW (ref 0.30–0.70)
Heparin Unfractionated: 0.4 IU/mL (ref 0.30–0.70)

## 2018-03-10 LAB — GLUCOSE, CAPILLARY
Glucose-Capillary: 206 mg/dL — ABNORMAL HIGH (ref 70–99)
Glucose-Capillary: 122 mg/dL — ABNORMAL HIGH (ref 70–99)
Glucose-Capillary: 102 mg/dL — ABNORMAL HIGH (ref 70–99)
Glucose-Capillary: 166 mg/dL — ABNORMAL HIGH (ref 70–99)

## 2018-03-10 MED ORDER — ASPIRIN 81 MG TABLET,DELAYED RELEASE
ORAL_TABLET | Freq: Every day | ORAL | 0 refills | 0.00000 days
Start: 2018-03-10 — End: 2018-03-10

## 2018-03-10 MED ORDER — QUETIAPINE 50 MG TABLET
ORAL_TABLET | Freq: Two times a day (BID) | ORAL | 0 refills | 0 days | Status: CP | PRN
Start: 2018-03-10 — End: 2018-03-18

## 2018-03-10 MED ORDER — HEPARIN BOLUS VIA INFUSION
1800.0000 [IU] | Freq: Once | INTRAVENOUS | Status: AC
  Administered 2018-03-10: 1800 [IU] via INTRAVENOUS
  Filled 2018-03-10: qty 1800

## 2018-03-10 MED ORDER — WARFARIN - PHYSICIAN DOSING INPATIENT: Freq: Every day | Status: DC

## 2018-03-10 MED ORDER — ASPIRIN 81 MG PO TBEC: 81.0000 mg | DELAYED_RELEASE_TABLET | Freq: Every day | ORAL | 0 refills | Status: DC

## 2018-03-10 MED ORDER — HEPARIN (PORCINE) 25000 UT/250ML-% IV SOLN: 2000.0000 [IU]/h | INTRAVENOUS | 0 refills | Status: DC

## 2018-03-10 MED ORDER — ISOSORBIDE MONONITRATE ER 30 MG PO TB24
30.0000 mg | ORAL_TABLET | Freq: Every day | ORAL | Status: DC
  Administered 2018-03-10 – 2018-03-11 (×2): 30 mg via ORAL
  Filled 2018-03-10 (×2): qty 1

## 2018-03-10 NOTE — Progress Notes (Signed)
[place] for Heparin  Indication: chest pain/ACS  Allergies  Allergen Reactions  . Morphine And Related     Overdose in hospital per patient report    Patient Measurements: Height: 6\' 3"  (190.5 cm) Weight: (!) 335 lb 12.8 oz (152.3 kg) IBW/kg (Calculated) : 84.5 Heparin Dosing Weight: 118.2 kg   Vital Signs: Temp: 97.5 F (36.4 C) (12/11 0745) Temp Source: Oral (12/11 0745) BP: 128/70 (12/11 1209) Pulse Rate: 72 (12/11 1209)  Labs: Recent Labs    03/08/18 1822  03/09/18 0139 03/09/18 0450 03/09/18 0805 03/10/18 0226 03/10/18 1007 03/10/18 1055 03/10/18 1550  HGB 13.0  --   --  10.5*  --  10.9*  --   --   --   HCT 40.6  --   --  33.7*  --  34.8*  --   --   --   PLT 247  --   --  202  --  200  --   --   --   LABPROT 12.1  --   --   --   --   --  15.1  --   --   INR 0.90  --   --   --   --   --  1.20  --   --   HEPARINUNFRC  --    < > <0.10*  --  0.30 0.21* 0.38  --  0.40  CREATININE 2.77*  --   --   --  2.78* 2.97*  --   --   --   TROPONINI 0.59*   < > 1.38* 1.72*  --   --   --  0.73*  --    < > = values in this interval not displayed.    Estimated Creatinine Clearance: 47.5 mL/min (A) (by C-G formula based on SCr of 2.97 mg/dL (H)).   Medical History: Past Medical History:  Diagnosis Date  . CAD (coronary artery disease)   . Chronic systolic heart failure ([place])   . Diabetes mellitus without complication ([place])   . Heart attack ([place])   . Hyperlipidemia   . Hypertension   . Ischemic cardiomyopathy   . Paroxysmal atrial fibrillation ([name])     Medications:  Medications Prior to Admission  Medication Sig Dispense Refill Last Dose  . allopurinol (ZYLOPRIM) 100 MG tablet Take 0.5 tablets (50 mg total) by mouth every other day. 7.5 tablet 1 03/08/2018 at 0800  . amiodarone (PACERONE) 200 MG tablet Take 200 mg by mouth daily.   03/08/2018 at 0800  . amLODipine (NORVASC) 10 MG tablet Take 10 mg by mouth daily.   03/08/2018 at  0800  . atorvastatin (LIPITOR) 80 MG tablet Take 80 mg by mouth every evening.    03/07/2018 at 2000  . BRILINTA 90 MG TABS tablet Take 90 mg by mouth 2 (two) times daily.   03/08/2018 at 0800  . bumetanide (BUMEX) 2 MG tablet Take 2 mg by mouth daily.   03/08/2018 at 0800  . carvedilol (COREG) 25 MG tablet Take 25-50 mg by mouth See admin instructions. Take 1 tablet (25MG ) by mouth every morning and 2 tablets (50MG ) by mouth every night   03/08/2018 at 0800  . cholecalciferol (VITAMIN D) 1000 units tablet Take 5,000 Units by mouth daily.   03/08/2018 at 0800  . cyclobenzaprine (FLEXERIL) 10 MG tablet Take 10 mg by mouth 3 (three) times daily as needed for muscle pain.   Past Week at PRN  .  ezetimibe (ZETIA) 10 MG tablet Take 10 mg by mouth daily.   03/08/2018 at 0800  . famotidine (PEPCID) 20 MG tablet Take 20 mg by mouth daily.   03/08/2018 at 0800  . FLUoxetine (PROZAC) 20 MG capsule Take 60 mg by mouth at bedtime.    03/07/2018 at 2000  . hydrALAZINE (APRESOLINE) 25 MG tablet Take 25 mg by mouth daily.   03/08/2018 at 0800  . insulin aspart (NOVOLOG) 100 UNIT/ML injection Inject 15 Units into the skin 3 (three) times daily with meals. 10 mL 11 03/08/2018 at 0800  . insulin detemir (LEVEMIR) 100 UNIT/ML injection Inject 0.4 mLs (40 Units total) into the skin 2 (two) times daily. 10 mL 11 03/08/2018 at 0800  . nitroGLYCERIN (NITROSTAT) 0.4 MG SL tablet Place 0.4 mg under the tongue every 5 (five) minutes as needed for chest pain.   Unknown at PRN  . RENVELA 800 MG tablet Take 1,600 mg by mouth 3 (three) times daily with meals.   03/08/2018 at 0800  . traMADol (ULTRAM) 50 MG tablet Take 1 tablet (50 mg total) by mouth every 6 (six) hours as needed. 30 tablet 0 Past Week at PRN  . VOLTAREN 1 % GEL Apply 2 g topically 3 (three) times daily.   03/08/2018 at 0800  . warfarin (COUMADIN) 6 MG tablet Take 24 mg by mouth every evening.    03/07/2018 at 2000    Assessment: Pharmacy consulted to dose heparin in this  47 year old male with NSTEMI.  Pt was on warfarin previously but subtherapeutic INR.  Troponins are still trending upward. Hgb trending down slightly but no reports of bleeding (13>10.5). I spoke with the patient and confirmed that he takes #4 6mg  warfarin tablets. He admits to no missed doses but INR is less than 1.0.  Heparin Course: 12/09: initiation bolus 4000 units then 1500 units/hr 12/10 0130 HL<0.10: bolus 3600 units, then inc to 1650 units/hr 12/10 0805 HL 0.30 12/11:  HL @ 0230 = 0.21 Will order Heparin 1800 units IV X 1 and increase drip rate to 2000 units/hr.  Will recheck HL 6 hrs after rate change.  Patient went for Cath, to restart Heparin 2 hours post TR band removal. Band removed at 18:25.   Goal of Therapy:  Heparin level 0.3-0.7 units/ml Monitor platelets by anticoagulation protocol: Yes   Plan:  12/11: 1007 HL= 0.38,  1550 HL = 0.40  Will continue current drip rate and check HL and CBC's daily (note also on Warfarin pta)   Lu Duffel, PharmD, BCPS Clinical Pharmacist 03/10/2018 4:14 PM

## 2018-03-10 NOTE — Discharge Summary (Signed)
[place] [facility] [place] NAME: Edwin [name]    MR#:  [id number]  DATE OF BIRTH:  1970/04/18  DATE OF ADMISSION:  03/08/2018 ADMITTING PHYSICIAN: Edwin Harms, MD  DATE OF DISCHARGE: 03/10/2018  PRIMARY CARE PHYSICIAN: Edwin [name]    ADMISSION DIAGNOSIS:  Hyperglycemia [R73.9] NSTEMI (non-ST elevated myocardial infarction) ([place]) [I21.4] Hypertensive emergency [I16.1]  DISCHARGE DIAGNOSIS:  Acute NSTEMI---transfer to [facility] for CABG  SECONDARY DIAGNOSIS:   Past Medical History:  Diagnosis Date  . CAD (coronary artery disease)   . Chronic systolic heart failure ([place])   . Diabetes mellitus without complication ([place])   . Heart attack ([place])   . Hyperlipidemia   . Hypertension   . Ischemic cardiomyopathy   . Paroxysmal atrial fibrillation [facility])     HOSPITAL COURSE:   [name] a47 y.o.malewith a known history [name] below presents emergency room with chest pain described as pressure radiating into his neck/left arm/shoulders, similar to last heart attack [name] patient, associated with emesis, shortness of breath, lasted 5 to 10 minutes, in the emergency room patient was noted to be tachycardic, hypertensive, sinus tachycardia noted on EKG   *Acute non-STEMI - Brilinta, aspirin, Coreg --cont IVheparin drip, supplemental oxygen PRN, prn nitroglycerin  - cardiology consultation with Edwin end noted--s/p  cath Significant three-vessel coronary artery disease including multifocal in-stent restenosis of up to 50-60% involving the mid LAD, 80% ostial stenosis of large D2 branch, which is jailed by LAD stent, 70% proximal LCx stenosis involving OM1, 60% mid RCA stenosis, and complex distal RCA bifurcation lesion of up to 90% stenosis ([place] 1,1,1).Mildly to moderately elevated left ventricular filling pressure (LVEDP 20 to 25 mmHg).  * hyperglycemia with chronic diabetes mellitus type 2 -Continue home regiment, start high-dose sliding  scale insulin with Accu-Cheks [name] routine, heart healthy/carbohydrate consistent diet -diabetic educator while in house  * malignant hypertension -Continue home regimen IV Lopressor 5 mg x1 now-schedule every 6 hours PRN, IV hydralazine PRN resistant hypertension, vitals [name] routine, make changes as [name] necessary -resumed home meds  *hyperlipidemia, unspecified -Continue statin/zetia  *Chronic paroxysmal A. Fib -Continue amiodarone, beta-blocker therapy, not therapeutic on Coumadin (takes coumadin 24 mg q pm !!!)-currently on heparin drip. Coumadin held  *Chronic diabetic and hypertensive nephropathy, chronic kidney disease stage IV Avoid nephrotoxic agents  pt will be transferred to [facility] once bed is available for CABG. Accepting physician Edwin [name].  Pt aware and risks and complications discussed for transfer CONSULTS OBTAINED:  Treatment Team:  Edwin Bush, MD  DRUG ALLERGIES:   Allergies  Allergen Reactions  . Morphine And Related     Overdose in hospital [name] patient report    DISCHARGE MEDICATIONS:   Allergies as of 03/10/2018      Reactions   Morphine And Related    Overdose in hospital [name] patient report      Medication List    STOP taking these medications   warfarin 6 MG tablet Commonly known as:  COUMADIN     TAKE these medications   allopurinol 100 MG tablet Commonly known as:  ZYLOPRIM Take 0.5 tablets (50 mg total) by mouth every other day.   amiodarone 200 MG tablet Commonly known as:  PACERONE Take 200 mg by mouth daily.   amLODipine 10 MG tablet Commonly known as:  NORVASC Take 10 mg by mouth daily.   aspirin 81 MG EC tablet Take 1 tablet (81 mg total) by mouth daily.  atorvastatin 80 MG tablet Commonly known as:  LIPITOR Take 80 mg by mouth every evening.   BRILINTA 90 MG Tabs tablet Generic drug:  ticagrelor Take 90 mg by mouth 2 (two) times daily.   bumetanide 2 MG tablet Commonly known as:  BUMEX Take 2 mg by  mouth daily.   carvedilol 25 MG tablet Commonly known as:  COREG Take 25-50 mg by mouth See admin instructions. Take 1 tablet (25MG ) by mouth every morning and 2 tablets (50MG ) by mouth every night   cholecalciferol 25 MCG (1000 UT) tablet Commonly known as:  VITAMIN D Take 5,000 Units by mouth daily.   cyclobenzaprine 10 MG tablet Commonly known as:  FLEXERIL Take 10 mg by mouth 3 (three) times daily as needed for muscle pain.   ezetimibe 10 MG tablet Commonly known as:  ZETIA Take 10 mg by mouth daily.   famotidine 20 MG tablet Commonly known as:  PEPCID Take 20 mg by mouth daily.   FLUoxetine 20 MG capsule Commonly known as:  PROZAC Take 60 mg by mouth at bedtime.   heparin 25000-0.45 UT/250ML-% infusion Inject 2,000 Units/hr into the vein continuous.   hydrALAZINE 25 MG tablet Commonly known as:  APRESOLINE Take 25 mg by mouth daily.   insulin aspart 100 UNIT/ML injection Commonly known as:  novoLOG Inject 15 Units into the skin 3 (three) times daily with meals.   insulin detemir 100 UNIT/ML injection Commonly known as:  LEVEMIR Inject 0.4 mLs (40 Units total) into the skin 2 (two) times daily.   nitroGLYCERIN 0.4 MG SL tablet Commonly known as:  NITROSTAT Place 0.4 mg under the tongue every 5 (five) minutes as needed for chest pain.   RENVELA 800 MG tablet Generic drug:  sevelamer carbonate Take 1,600 mg by mouth 3 (three) times daily with meals.   traMADol 50 MG tablet Commonly known as:  ULTRAM Take 1 tablet (50 mg total) by mouth every 6 (six) hours as needed.   VOLTAREN 1 % Gel Generic drug:  diclofenac sodium Apply 2 g topically 3 (three) times daily.       If you experience worsening of your admission symptoms, develop shortness of breath, life threatening emergency, suicidal or homicidal thoughts you must seek medical attention immediately by calling 911 or calling your MD immediately  if symptoms less severe.  You Must read complete  instructions/literature along with all the possible adverse reactions/side effects for all the Medicines you take and that have been prescribed to you. Take any new Medicines after you have completely understood and accept all the possible adverse reactions/side effects.   Please note  You were cared for by a hospitalist during your hospital stay. If you have any questions about your discharge medications or the care you received while you were in the hospital after you are discharged, you can call the unit and asked to speak with the hospitalist on call if the hospitalist that took care of you is not available. Once you are discharged, your primary care physician will handle any further medical issues. Please note that NO REFILLS for any discharge medications will be authorized once you are discharged, as it is imperative that you return to your primary care physician (or establish a relationship with a primary care physician if you do not have one) for your aftercare needs so that they can reassess your need for medications and monitor your lab values. Today   SUBJECTIVE   Doing well. No CP  VITAL SIGNS:  Blood pressure (!) 149/72, pulse 70, temperature (!) 97.5 F (36.4 C), temperature source Oral, resp. rate 18, height 6\' 3"  (1.905 m), weight (!) 152.3 kg, SpO2 98 %.  I/O:    Intake/Output Summary (Last 24 hours) at 03/10/2018 0802 Last data filed at 03/10/2018 0300 Gross [name] 24 hour  Intake 119.97 ml  Output 1425 ml  Net -1305.03 ml    PHYSICAL EXAMINATION:  GENERAL:  48 y.o.-year-old patient lying in the bed with no acute distress. OBESE EYES: Pupils equal, round, reactive to light and accommodation. No scleral icterus. Extraocular muscles intact.  HEENT: Head atraumatic, normocephalic. Oropharynx and nasopharynx clear.  NECK:  Supple, no jugular venous distention. No thyroid enlargement, no tenderness.  LUNGS: Normal breath sounds bilaterally, no wheezing, rales,rhonchi or  crepitation. No use of accessory muscles of respiration.  CARDIOVASCULAR: S1, S2 normal. No murmurs, rubs, or gallops.  ABDOMEN: Soft, non-tender, non-distended. Bowel sounds present. No organomegaly or mass.  EXTREMITIES: No pedal edema, cyanosis, or clubbing.  NEUROLOGIC: Cranial nerves II through XII are intact. Muscle strength 5/5 in all extremities. Sensation intact. Gait not checked.  PSYCHIATRIC: The patient is alert and oriented x 3.  SKIN: No obvious rash, lesion, or ulcer.   DATA REVIEW:   CBC  Recent Labs  Lab 03/10/18 0226  WBC 9.9  HGB 10.9*  HCT 34.8*  PLT 200    Chemistries  Recent Labs  Lab 03/10/18 0226  NA 133*  K 4.6  CL 105  CO2 21*  GLUCOSE 207*  BUN 30*  CREATININE 2.97*  CALCIUM 8.3*    Microbiology Results   No results found for this or any previous visit (from the past 240 hour(s)).  RADIOLOGY:  Dg Chest 2 View  Result Date: 03/08/2018 CLINICAL DATA:  47 year old male with chest tightness and numbness in both arms beginning at 1700 hours. EXAM: CHEST - 2 VIEW COMPARISON:  Portable chest 12/16/2017. FINDINGS: Chronic left chest cardiac AICD and chronic left rib fractures. Lung volumes and mediastinal contours within normal limits. Both lungs appear clear. No pneumothorax or pleural effusion. Visualized tracheal air column is within normal limits. No acute osseous abnormality identified. Paucity of bowel gas in the upper abdomen. IMPRESSION: No acute cardiopulmonary abnormality. Electronically Signed   By: Genevie Ann M.D.   On: 03/08/2018 18:53     Management plans discussed with the patient, family and they are in agreement.  CODE STATUS:     Code Status Orders  (From admission, onward)         Start     Ordered   03/08/18 2246  Full code  Continuous     03/08/18 2245        Code Status History    Date Active Date Inactive Code Status Order ID Comments User Context   12/16/2017 2226 12/17/2017 1550 Full Code [id number]  Thornton Park, MD Inpatient      TOTAL TIME TAKING CARE OF THIS PATIENT: **40* minutes.    Fritzi Mandes M.D on 03/10/2018 at 8:02 AM  Between 7am to 6pm - Pager - [phone] After 6pm go to [email or web address] - password [email or web address] [facility]  Office  [phone]  CC: Primary care physician; Edwin [name]

## 2018-03-10 NOTE — Progress Notes (Signed)
Patient complained of chest pain. Vital signs WDL. Nitro paste applied to chest. Cardiology PA at bedside assessing patient. Patient now states the pain has eased off. Will continue to monitor patient.

## 2018-03-10 NOTE — Progress Notes (Signed)
[place] for Heparin  Indication: chest pain/ACS  Allergies  Allergen Reactions  . Morphine And Related     Overdose in hospital per patient report    Patient Measurements: Height: 6\' 3"  (190.5 cm) Weight: (!) 335 lb 3.2 oz (152 kg) IBW/kg (Calculated) : 84.5 Heparin Dosing Weight: 118.2 kg   Vital Signs: Temp: 97.8 F (36.6 C) (12/10 1954) Temp Source: Oral (12/10 1954) BP: 151/88 (12/10 2348) Pulse Rate: 70 (12/10 2348)  Labs: Recent Labs    03/08/18 1822 03/08/18 2308 03/09/18 0139 03/09/18 0450 03/09/18 0805 03/10/18 0226  HGB 13.0  --   --  10.5*  --  10.9*  HCT 40.6  --   --  33.7*  --  34.8*  PLT 247  --   --  202  --  200  LABPROT 12.1  --   --   --   --   --   INR 0.90  --   --   --   --   --   HEPARINUNFRC  --   --  <0.10*  --  0.30 0.21*  CREATININE 2.77*  --   --   --  2.78* 2.97*  TROPONINI 0.59* 1.17* 1.38* 1.72*  --   --     Estimated Creatinine Clearance: 47.4 mL/min (A) (by C-G formula based on SCr of 2.97 mg/dL (H)).   Medical History: Past Medical History:  Diagnosis Date  . CAD (coronary artery disease)   . Chronic systolic heart failure ([place])   . Diabetes mellitus without complication ([place])   . Heart attack ([place])   . Hyperlipidemia   . Hypertension   . Ischemic cardiomyopathy   . Paroxysmal atrial fibrillation ([name])     Medications:  Medications Prior to Admission  Medication Sig Dispense Refill Last Dose  . allopurinol (ZYLOPRIM) 100 MG tablet Take 0.5 tablets (50 mg total) by mouth every other day. 7.5 tablet 1 03/08/2018 at 0800  . amiodarone (PACERONE) 200 MG tablet Take 200 mg by mouth daily.   03/08/2018 at 0800  . amLODipine (NORVASC) 10 MG tablet Take 10 mg by mouth daily.   03/08/2018 at 0800  . atorvastatin (LIPITOR) 80 MG tablet Take 80 mg by mouth every evening.    03/07/2018 at 2000  . BRILINTA 90 MG TABS tablet Take 90 mg by mouth 2 (two) times daily.   03/08/2018 at 0800  .  bumetanide (BUMEX) 2 MG tablet Take 2 mg by mouth daily.   03/08/2018 at 0800  . carvedilol (COREG) 25 MG tablet Take 25-50 mg by mouth See admin instructions. Take 1 tablet (25MG ) by mouth every morning and 2 tablets (50MG ) by mouth every night   03/08/2018 at 0800  . cholecalciferol (VITAMIN D) 1000 units tablet Take 5,000 Units by mouth daily.   03/08/2018 at 0800  . cyclobenzaprine (FLEXERIL) 10 MG tablet Take 10 mg by mouth 3 (three) times daily as needed for muscle pain.   Past Week at PRN  . ezetimibe (ZETIA) 10 MG tablet Take 10 mg by mouth daily.   03/08/2018 at 0800  . famotidine (PEPCID) 20 MG tablet Take 20 mg by mouth daily.   03/08/2018 at 0800  . FLUoxetine (PROZAC) 20 MG capsule Take 60 mg by mouth at bedtime.    03/07/2018 at 2000  . hydrALAZINE (APRESOLINE) 25 MG tablet Take 25 mg by mouth daily.   03/08/2018 at 0800  . insulin aspart (NOVOLOG) 100 UNIT/ML injection Inject  15 Units into the skin 3 (three) times daily with meals. 10 mL 11 03/08/2018 at 0800  . insulin detemir (LEVEMIR) 100 UNIT/ML injection Inject 0.4 mLs (40 Units total) into the skin 2 (two) times daily. 10 mL 11 03/08/2018 at 0800  . nitroGLYCERIN (NITROSTAT) 0.4 MG SL tablet Place 0.4 mg under the tongue every 5 (five) minutes as needed for chest pain.   Unknown at PRN  . RENVELA 800 MG tablet Take 1,600 mg by mouth 3 (three) times daily with meals.   03/08/2018 at 0800  . traMADol (ULTRAM) 50 MG tablet Take 1 tablet (50 mg total) by mouth every 6 (six) hours as needed. 30 tablet 0 Past Week at PRN  . VOLTAREN 1 % GEL Apply 2 g topically 3 (three) times daily.   03/08/2018 at 0800  . warfarin (COUMADIN) 6 MG tablet Take 24 mg by mouth every evening.    03/07/2018 at 2000    Assessment: Pharmacy consulted to dose heparin in this 47 year old male with NSTEMI.  Pt was on warfarin previously but subtherapeutic INR.  Troponins are still trending upward. Hgb trending down slightly but no reports of bleeding (13>10.5). I spoke  with the patient and confirmed that he takes #4 6mg  warfarin tablets. He admits to no missed doses but INR is less than 1.0.  Heparin Course: 12/09: initiation bolus 4000 units then 1500 units/hr 12/10 0130 HL<0.10: bolus 3600 units, then inc to 1650 units/hr 12/10 0805 HL 0.30  Patient went for Cath, to restart Heparin 2 hours post TR band removal. Band removed at 18:25.   Goal of Therapy:  Heparin level 0.3-0.7 units/ml Monitor platelets by anticoagulation protocol: Yes   Plan:  Will resume Heparin 1800 units/hr at 20:30 and will check HL in 6 hours. Will continue to monitor CBC.  12/11:  HL @ 0230 = 0.21 Will order Heparin 1800 units IV X 1 and increase drip rate to 2000 units/hr.  Will recheck HL 6 hrs after rate change.   Edwin [name] D 03/10/2018 3:47 AM

## 2018-03-10 NOTE — Progress Notes (Signed)
[place] for Heparin  Indication: chest pain/ACS  Allergies  Allergen Reactions  . Morphine And Related     Overdose in hospital per patient report    Patient Measurements: Height: 6\' 3"  (190.5 cm) Weight: (!) 335 lb 12.8 oz (152.3 kg) IBW/kg (Calculated) : 84.5 Heparin Dosing Weight: 118.2 kg   Vital Signs: Temp: 97.5 F (36.4 C) (12/11 0745) Temp Source: Oral (12/11 0745) BP: 163/85 (12/11 1019) Pulse Rate: 69 (12/11 1019)  Labs: Recent Labs    03/08/18 1822 03/08/18 2308  03/09/18 0139 03/09/18 0450 03/09/18 0805 03/10/18 0226 03/10/18 1007  HGB 13.0  --   --   --  10.5*  --  10.9*  --   HCT 40.6  --   --   --  33.7*  --  34.8*  --   PLT 247  --   --   --  202  --  200  --   LABPROT 12.1  --   --   --   --   --   --  15.1  INR 0.90  --   --   --   --   --   --  1.20  HEPARINUNFRC  --   --    < > <0.10*  --  0.30 0.21* 0.38  CREATININE 2.77*  --   --   --   --  2.78* 2.97*  --   TROPONINI 0.59* 1.17*  --  1.38* 1.72*  --   --   --    < > = values in this interval not displayed.    Estimated Creatinine Clearance: 47.5 mL/min (A) (by C-G formula based on SCr of 2.97 mg/dL (H)).   Medical History: Past Medical History:  Diagnosis Date  . CAD (coronary artery disease)   . Chronic systolic heart failure ([place])   . Diabetes mellitus without complication ([place])   . Heart attack ([place])   . Hyperlipidemia   . Hypertension   . Ischemic cardiomyopathy   . Paroxysmal atrial fibrillation ([name])     Medications:  Medications Prior to Admission  Medication Sig Dispense Refill Last Dose  . allopurinol (ZYLOPRIM) 100 MG tablet Take 0.5 tablets (50 mg total) by mouth every other day. 7.5 tablet 1 03/08/2018 at 0800  . amiodarone (PACERONE) 200 MG tablet Take 200 mg by mouth daily.   03/08/2018 at 0800  . amLODipine (NORVASC) 10 MG tablet Take 10 mg by mouth daily.   03/08/2018 at 0800  . atorvastatin (LIPITOR) 80 MG tablet Take 80 mg  by mouth every evening.    03/07/2018 at 2000  . BRILINTA 90 MG TABS tablet Take 90 mg by mouth 2 (two) times daily.   03/08/2018 at 0800  . bumetanide (BUMEX) 2 MG tablet Take 2 mg by mouth daily.   03/08/2018 at 0800  . carvedilol (COREG) 25 MG tablet Take 25-50 mg by mouth See admin instructions. Take 1 tablet (25MG ) by mouth every morning and 2 tablets (50MG ) by mouth every night   03/08/2018 at 0800  . cholecalciferol (VITAMIN D) 1000 units tablet Take 5,000 Units by mouth daily.   03/08/2018 at 0800  . cyclobenzaprine (FLEXERIL) 10 MG tablet Take 10 mg by mouth 3 (three) times daily as needed for muscle pain.   Past Week at PRN  . ezetimibe (ZETIA) 10 MG tablet Take 10 mg by mouth daily.   03/08/2018 at 0800  . famotidine (PEPCID) 20 MG tablet Take  20 mg by mouth daily.   03/08/2018 at 0800  . FLUoxetine (PROZAC) 20 MG capsule Take 60 mg by mouth at bedtime.    03/07/2018 at 2000  . hydrALAZINE (APRESOLINE) 25 MG tablet Take 25 mg by mouth daily.   03/08/2018 at 0800  . insulin aspart (NOVOLOG) 100 UNIT/ML injection Inject 15 Units into the skin 3 (three) times daily with meals. 10 mL 11 03/08/2018 at 0800  . insulin detemir (LEVEMIR) 100 UNIT/ML injection Inject 0.4 mLs (40 Units total) into the skin 2 (two) times daily. 10 mL 11 03/08/2018 at 0800  . nitroGLYCERIN (NITROSTAT) 0.4 MG SL tablet Place 0.4 mg under the tongue every 5 (five) minutes as needed for chest pain.   Unknown at PRN  . RENVELA 800 MG tablet Take 1,600 mg by mouth 3 (three) times daily with meals.   03/08/2018 at 0800  . traMADol (ULTRAM) 50 MG tablet Take 1 tablet (50 mg total) by mouth every 6 (six) hours as needed. 30 tablet 0 Past Week at PRN  . VOLTAREN 1 % GEL Apply 2 g topically 3 (three) times daily.   03/08/2018 at 0800  . warfarin (COUMADIN) 6 MG tablet Take 24 mg by mouth every evening.    03/07/2018 at 2000    Assessment: Pharmacy consulted to dose heparin in this 47 year old male with NSTEMI.  Pt was on warfarin  previously but subtherapeutic INR.  Troponins are still trending upward. Hgb trending down slightly but no reports of bleeding (13>10.5). I spoke with the patient and confirmed that he takes #4 6mg  warfarin tablets. He admits to no missed doses but INR is less than 1.0.  Heparin Course: 12/09: initiation bolus 4000 units then 1500 units/hr 12/10 0130 HL<0.10: bolus 3600 units, then inc to 1650 units/hr 12/10 0805 HL 0.30 12/11:  HL @ 0230 = 0.21 Will order Heparin 1800 units IV X 1 and increase drip rate to 2000 units/hr.  Will recheck HL 6 hrs after rate change.  Patient went for Cath, to restart Heparin 2 hours post TR band removal. Band removed at 18:25.   Goal of Therapy:  Heparin level 0.3-0.7 units/ml Monitor platelets by anticoagulation protocol: Yes   Plan:  12/11: 1007 HL= 0.38. Will continue current drip rate and check confirmatory level in 6 hours.  (note also on Warfarin)   Adreona Brand A 03/10/2018 11:32 AM

## 2018-03-10 NOTE — Progress Notes (Signed)
Chaplain rounded to Pt after referral from on-call chaplain. Chaplain practiced active listen as Pt talked about his life and his previous heart issues. Chaplain was paged away.    03/10/18 2000  Clinical Encounter Type  Visited With Patient  Visit Type Follow-up  Referral From Chaplain  Spiritual Encounters  Spiritual Needs Emotional

## 2018-03-10 NOTE — Progress Notes (Signed)
Patient complaining of chest pain again. Vital signs WDL. Pain only lasts for about 77min and goes away. Currently is not having active chest pain. Will continue to monitor.

## 2018-03-10 NOTE — Plan of Care (Signed)
Patient has complaints of chronic left neck pain but denies any chest pain. Patient has concerns about having open heart surgery. Family is in emotional distress per patient's report. The chaplain has consulted. Discussed the benefits for patient and family of having procedure performed. Will continue to council.

## 2018-03-10 NOTE — Progress Notes (Addendum)
Progress Note  [name] Date of Encounter: 03/10/2018  Primary Cardiologist:   Subjective   S/p 03/09/2018 LHC with results and recommendations below.   Patient reporting 8/10 chest pain at entry into room today, alleviated with nitro paste. Pain was described as centrally located and sharp, stabbing, and intermittent. The pain radiated down bilateral extremities and described as worse in the R arm than the L arm. He also reported the pain radiated up the R side of his neck. While in pain, he was unable to speak or respond to questions. He denied associated nausea or SOB.  Vitals were taken during this episode and within limits. Plan to start imdur 30mg  daily and continue to monitor on telemetry.  Inpatient Medications    Scheduled Meds: . allopurinol  50 mg Oral QODAY  . amiodarone  200 mg Oral Daily  . amLODipine  10 mg Oral Daily  . aspirin EC  81 mg Oral Daily  . atorvastatin  80 mg Oral QPM  . carvedilol  25 mg Oral q morning - 10a   And  . carvedilol  50 mg Oral QHS  . cholecalciferol  5,000 Units Oral Daily  . ezetimibe  10 mg Oral Daily  . famotidine  20 mg Oral Daily  . FLUoxetine  60 mg Oral QHS  . hydrALAZINE  25 mg Oral Daily  . insulin aspart  0-20 Units Subcutaneous TID WC  . insulin aspart  0-5 Units Subcutaneous QHS  . insulin aspart  15 Units Subcutaneous TID WC  . insulin detemir  40 Units Subcutaneous BID  . isosorbide mononitrate  30 mg Oral Daily  . sevelamer carbonate  1,600 mg Oral TID WC  . sodium chloride flush  3 mL Intravenous Q12H  . sodium chloride flush  3 mL Intravenous Q12H  . warfarin  24 mg Oral QPM  . Warfarin - Physician Dosing Inpatient   Does not apply q1800   Continuous Infusions: . sodium chloride    . heparin 2,000 Units/hr (03/10/18 7672)   PRN Meds: sodium chloride, acetaminophen, ALPRAZolam, cyclobenzaprine, hydrALAZINE, ondansetron (ZOFRAN) IV, sodium chloride flush, traMADol   Vital Signs      Vitals:   03/10/18 0505 03/10/18 0602 03/10/18 0745 03/10/18 1019  BP: (!) 104/55 99/68 (!) 149/72 (!) 163/85  Pulse: 73 70 70 69  Resp: 16  18   Temp: 98.6 F (37 C)  (!) 97.5 F (36.4 C)   TempSrc: Oral  Oral   SpO2: 97%  98% 100%  Weight: (!) 152.3 kg     Height:        Intake/Output Summary (Last 24 hours) at 03/10/2018 1150 Last data filed at 03/10/2018 1041 Gross per 24 hour  Intake 359.97 ml  Output 1825 ml  Net -1465.03 ml   Filed Weights   03/08/18 2226 03/09/18 1041 03/10/18 0505  Weight: (!) 151.3 kg (!) 152 kg (!) 152.3 kg    Telemetry    6AM ventricular pacing; SR, atrial pacing- Personally Reviewed  ECG    NSR, borderline LVH, nonspecific t wave abnormality - Personally Reviewed  Physical Exam   GEN: Morbidly obese. In distress and clutching his chest, complaining of 8/10 chest pain until receiving nitro paste  Neck: JVD difficult to assess d/t body habitus  Cardiac: RRR, no murmurs, rubs, or gallops.  Respiratory: Clear to auscultation bilaterally. GI: Obese - Soft, nontender, non-distended  MS: No edema; No deformity. Neuro:  Nonfocal  Psych: Agitated  Labs    Chemistry Recent Labs  Lab 03/08/18 1822 03/09/18 0805 03/10/18 0226  NA 132* 134* 133*  K 4.1 4.2 4.6  CL 103 107 105  CO2 19* 19* 21*  GLUCOSE 433* 202* 207*  BUN 29* 29* 30*  CREATININE 2.77* 2.78* 2.97*  CALCIUM 8.8* 8.1* 8.3*  GFRNONAA 26* 26* 24*  GFRAA 30* 30* 27*  ANIONGAP 10 8 7      Hematology Recent Labs  Lab 03/08/18 1822 03/09/18 0450 03/10/18 0226  WBC 10.9* 9.7 9.9  RBC 4.90 4.03* 4.12*  HGB 13.0 10.5* 10.9*  HCT 40.6 33.7* 34.8*  MCV 82.9 83.6 84.5  MCH 26.5 26.1 26.5  MCHC 32.0 31.2 31.3  RDW 15.7* 15.9* 16.1*  PLT 247 202 200    Cardiac Enzymes Recent Labs  Lab 03/08/18 2308 03/09/18 0139 03/09/18 0450 03/10/18 1055  TROPONINI 1.17* 1.38* 1.72* 0.73*   No results for input(s): TROPIPOC in the last 168 hours.   BNPNo results for  input(s): BNP, PROBNP in the last 168 hours.   DDimer No results for input(s): DDIMER in the last 168 hours.   Radiology    Dg Chest 2 View  Result Date: 03/08/2018 CLINICAL DATA:  47 year old male with chest tightness and numbness in both arms beginning at 1700 hours. EXAM: CHEST - 2 VIEW COMPARISON:  Portable chest 12/16/2017. FINDINGS: Chronic left chest cardiac AICD and chronic left rib fractures. Lung volumes and mediastinal contours within normal limits. Both lungs appear clear. No pneumothorax or pleural effusion. Visualized tracheal air column is within normal limits. No acute osseous abnormality identified. Paucity of bowel gas in the upper abdomen. IMPRESSION: No acute cardiopulmonary abnormality. Electronically Signed   By: Genevie Ann M.D.   On: 03/08/2018 18:53    Cardiac Studies   03/10/2018 TTE Study Conclusions - Procedure narrative: Transthoracic echocardiography. Image   quality was suboptimal. The study was technically difficult. - Left ventricle: The cavity size was normal. Wall thickness was   increased in a pattern of moderate LVH. There was moderate   concentric hypertrophy. Systolic function was moderately reduced.   The estimated ejection fraction was in the range of 35% to 40%.   The study is not technically sufficient to allow evaluation of LV   diastolic function.  03/09/2018 LHC Conclusions: 1. Significant three-vessel coronary artery disease including multifocal in-stent restenosis of up to 50-60% involving the mid LAD, 80% ostial stenosis of large D2 branch, which is jailed by LAD stent, 70% proximal LCx stenosis involving OM1, 60% mid RCA stenosis, and complex distal RCA bifurcation lesion of up to 90% stenosis ([place] 1,1,1). 2. Mildly to moderately elevated left ventricular filling pressure (LVEDP 20 to 25 mmHg). Recommendations: 1. Progression of multivessel coronary artery disease compared with cath report at [facility] a year ago.  There is also moderate  in-stent restenosis involving the LAD.  Given three-vessel disease and diabetes, surgical consultation for CABG is recommended.  Given that the patient has a complex cardiac history and was hospitalized for almost 3 months at [facility] last year, we will arrange for transfer to [facility] for further management. 2. Discontinue ticagrelor and initiate aspirin 81 mg daily. 3. Restart heparin infusion 2 hours after TR band removal. 4. Aggressive secondary prevention. 5. Obtain echo to assess LVEF. (see directly above) 6. Close monitoring of renal function, given CKD stage IV.  Diagnostic  Dominance: Right      LHC/PCI (03/27/17 - [facility]): 1. Significant 2-vessel coronary artery disease.There is a 90% stenosis  of the proximal LAD with TIMI 1 flow into the distal vessel. There is a  70% stenosis of the ostial DIAG2 vessel which was treated with balloon  angiogplasty. There is a 70% stenosis of the proximal LCx and there is  mild-moderate RCA disease.  2. Decreased LV Function by previous echoEF estimated 30 %.LVEDP 35 mm  Hg 3. PCI was performed of the proximal and mid 90% LAD lesion using Resolute  Onyx (drug eluting stent) x 2. Good angiographic result with TIMI 3 flow 4. Impella was placed for cardiogenic shock prior to PCI 5. Right heart cath after Impella placement- RA mean 11, RV 40/8,PA  41/21 mean 29,PCWP mean 12 6. PA sat 58, Fick CO/CI 4.6/1.7   Echo (04/08/17 [facility]):  Mildly decreased left ventricular systolic function, ejection fraction  45%  Dilated left atrium - mild  Normal right ventricular systolic function  Dilated right atrium - mild  No significant valvular abnormalities  Technically difficult study due to chest wall/lung interference  Ultrasound enhancing agent utilized to improve endocardial border  definition  Patient Profile     47 y.o. male with a history of CAD with STEMI in 79/0240 and NSTEMI complicated by cardiogenic shock following cardiac arrest  with VT.  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  Assessment & Plan    NSTEMI s/p 12/10 LHC, CAD - Chest pain at time of today's assessment, relieved with nitro paste and will start on imdur as below. Troponin max 1.72 (12/10) and trending down. Today 12/11 0.73. EKG as above. Most recent [employer] in chart. Echo as above.  - 12/10 LHC as above with 3v disease including moderate in-stent restenosis involving the LAD. Mildly to moderately elevated LVEDP 20-25% mmHG. Cath site with dressing clean, dry, intact.  - Awaiting transfer to [facility] - given 3v disease and DM2, surgical consultation for CABG recommended. Recommend continued close monitoring of renal function given CKD IV  As above.  - Continue medical mangement with ASA 81mg  daily, Coreg, Zetia 10mg , atorvastatin. Checking lipid panel, liver panel given start of statin therapy. Ticagrelor was discontinued and asa started - continue. Would consider stopping CCB / norvasc d/t reduced EF as below. Restarting warfarin s/p LHC. Started on imdur given continued chest pain following cardiac catheterization and imdur 30mg  daily  will achieve more consistent anginal relief. Not on ACE/ARB/ARNI/spiro d/t renal function as below.  - Will need outpatient cardiology follow-up with [facility] after discharge.  Chronic HFrEF - Most recent echo as above showing EF 35-40% and as above - Consider discontinuing norvasc as reduced EF. D/t reduced renal function, defer starting entresto for reduced EF. Defere ACE/ARB/  aldosterone antagonist. - Device interrogated as patient was reporting palpitations and functioning normally.  - Continue medical management as above.   PAF - CHA2DS2VASc score of at least 4 (CHF, HTN, DM2, vascular) on PTA warfarin for anticoagulation - Currently SR, atrial and ventricular pacing noted during admission. Consider checking TSH. - Restarting warfarin as s/p cath. Continue coreg, amiodarone 200mg  daily. - Continue to monitor on telemetry with ventricular pacing noted earlier this am and around 6AM  as noted above.  DM2 - SSI - Hold any metformin containing medications for 48h following cath - Per IM   CKD - As above, caution with nephrotoxins. Hold metformin containing medications for 48h s/p cath. No ACE/ARB/ARNI/spiro recommended at this time. - Continue to monitor with daily BMET and consider consultation with nephrology   HLD - Atorvastatin as above - Pending updated lipid panel  HTN - BP controlled as above   For questions or updates, please contact [place] Please consult [email or web address] for contact info under        Signed, Arvil Chaco, PA-C  03/10/2018, 11:50 AM

## 2018-03-10 NOTE — Progress Notes (Signed)
*  PRELIMINARY RESULTS* Echocardiogram 2D Echocardiogram has been performed.  Edwin [name] 03/10/2018, 8:56 AM

## 2018-03-11 ENCOUNTER — Ambulatory Visit (HOSPITAL_COMMUNITY)
Admission: AD | Admit: 2018-03-11 | Discharge: 2018-03-11 | Disposition: A | Discharge status: Home / Self Care | Payer: Medicare Other | Source: Other Acute Inpatient Hospital | Attending: Internal Medicine | Admitting: Internal Medicine

## 2018-03-11 DIAGNOSIS — I214 Non-ST elevation (NSTEMI) myocardial infarction: Principal | ICD-10-CM

## 2018-03-11 DIAGNOSIS — R079 Chest pain, unspecified: Secondary | ICD-10-CM | POA: Insufficient documentation

## 2018-03-11 LAB — CBC
HCT: 32.5 % — ABNORMAL LOW (ref 39.0–52.0)
Hemoglobin: 10.2 g/dL — ABNORMAL LOW (ref 13.0–17.0)
MCH: 26.8 pg (ref 26.0–34.0)
MCHC: 31.4 g/dL (ref 30.0–36.0)
MCV: 85.5 fL (ref 80.0–100.0)
Platelets: 216 10*3/uL (ref 150–400)
RBC: 3.8 MIL/uL — ABNORMAL LOW (ref 4.22–5.81)
RDW: 16.1 % — ABNORMAL HIGH (ref 11.5–15.5)
WBC: 9.9 10*3/uL (ref 4.0–10.5)
nRBC: 0 % (ref 0.0–0.2)

## 2018-03-11 LAB — BASIC METABOLIC PANEL
Anion gap: 6 (ref 5–15)
BUN: 34 mg/dL — ABNORMAL HIGH (ref 6–20)
CO2: 20 mmol/L — AB (ref 22–32)
Calcium: 8.4 mg/dL — ABNORMAL LOW (ref 8.9–10.3)
Chloride: 108 mmol/L (ref 98–111)
Creatinine, Ser: 3.37 mg/dL — ABNORMAL HIGH (ref 0.61–1.24)
GFR calc non Af Amer: 20 mL/min — ABNORMAL LOW (ref >60)
GFR, EST AFRICAN AMERICAN: 23 mL/min — AB (ref >60)
Glucose, Bld: 168 mg/dL — ABNORMAL HIGH (ref 70–99)
POTASSIUM: 4.7 mmol/L (ref 3.5–5.1)
Sodium: 134 mmol/L — ABNORMAL LOW (ref 135–145)

## 2018-03-11 LAB — HEPARIN LEVEL (UNFRACTIONATED)
HEPARIN UNFRACTIONATED: 0.27 [IU]/mL — AB (ref 0.30–0.70)
Heparin Unfractionated: 0.41 IU/mL (ref 0.30–0.70)
Heparin Unfractionated: 0.41 IU/mL (ref 0.30–0.70)

## 2018-03-11 LAB — PROTIME-INR
INR: 1.17
Prothrombin Time: 14.8 seconds (ref 11.4–15.2)

## 2018-03-11 LAB — GLUCOSE, CAPILLARY
GLUCOSE-CAPILLARY: 184 mg/dL — AB (ref 70–99)
Glucose-Capillary: 185 mg/dL — ABNORMAL HIGH (ref 70–99)
Glucose-Capillary: 156 mg/dL — ABNORMAL HIGH (ref 70–99)

## 2018-03-11 MED ORDER — ISOSORBIDE MONONITRATE ER 30 MG TABLET,EXTENDED RELEASE 24 HR
ORAL_TABLET | Freq: Every day | ORAL | 0 refills | 0.00000 days
Start: 2018-03-11 — End: 2018-03-18

## 2018-03-11 MED ORDER — HEPARIN BOLUS VIA INFUSION
1800.0000 [IU] | Freq: Once | INTRAVENOUS | Status: AC
  Administered 2018-03-11: 1800 [IU] via INTRAVENOUS
  Filled 2018-03-11: qty 1800

## 2018-03-11 MED ORDER — ISOSORBIDE MONONITRATE ER 30 MG PO TB24: 30.0000 mg | ORAL_TABLET | Freq: Every day | ORAL | 0 refills | Status: DC

## 2018-03-11 NOTE — Progress Notes (Signed)
ANTICOAGULATION CONSULT NOTE  Pharmacy Consult for Heparin  Indication: chest pain/ACS  Patient Measurements: Height: 6\' 3"  (190.5 cm) Weight: (!) 335 lb 12.8 oz (152.3 kg) IBW/kg (Calculated) : 84.5 Heparin Dosing Weight: 118.2 kg   Vital Signs: Temp: 98.5 F (36.9 C) (12/12 0452) Temp Source: Oral (12/12 0452) BP: 117/55 (12/12 0738) Pulse Rate: 70 (12/12 0738)  Labs: Recent Labs    03/08/18 1822  03/09/18 0139 03/09/18 0450 03/09/18 0805 03/10/18 0226 03/10/18 1007 03/10/18 1055 03/10/18 1550 03/11/18 0419 03/11/18 1204  HGB 13.0  --   --  10.5*  --  10.9*  --   --   --  10.2*  --   HCT 40.6  --   --  33.7*  --  34.8*  --   --   --  32.5*  --   PLT 247  --   --  202  --  200  --   --   --  216  --   LABPROT 12.1  --   --   --   --   --  15.1  --   --  14.8  --   INR 0.90  --   --   --   --   --  1.20  --   --  1.17  --   HEPARINUNFRC  --    < > <0.10*  --  0.30 0.21* 0.38  --  0.40 0.27* 0.41  CREATININE 2.77*  --   --   --  2.78* 2.97*  --   --   --   --  3.37*  TROPONINI 0.59*   < > 1.38* 1.72*  --   --   --  0.73*  --   --   --    < > = values in this interval not displayed.    Estimated Creatinine Clearance: 41.9 mL/min (A) (by C-G formula based on SCr of 3.37 mg/dL (H)).   Medical History: Past Medical History:  Diagnosis Date  . CAD (coronary artery disease)   . Chronic systolic heart failure ([place])   . Diabetes mellitus without complication ([place])   . Heart attack ([place])   . Hyperlipidemia   . Hypertension   . Ischemic cardiomyopathy   . Paroxysmal atrial fibrillation ([name])     Medications:  Medications Prior to Admission  Medication Sig Dispense Refill Last Dose  . allopurinol (ZYLOPRIM) 100 MG tablet Take 0.5 tablets (50 mg total) by mouth every other day. 7.5 tablet 1 03/08/2018 at 0800  . amiodarone (PACERONE) 200 MG tablet Take 200 mg by mouth daily.   03/08/2018 at 0800  . amLODipine (NORVASC) 10 MG tablet Take 10 mg by mouth daily.    03/08/2018 at 0800  . atorvastatin (LIPITOR) 80 MG tablet Take 80 mg by mouth every evening.    03/07/2018 at 2000  . BRILINTA 90 MG TABS tablet Take 90 mg by mouth 2 (two) times daily.   03/08/2018 at 0800  . bumetanide (BUMEX) 2 MG tablet Take 2 mg by mouth daily.   03/08/2018 at 0800  . carvedilol (COREG) 25 MG tablet Take 25-50 mg by mouth See admin instructions. Take 1 tablet (25MG ) by mouth every morning and 2 tablets (50MG ) by mouth every night   03/08/2018 at 0800  . cholecalciferol (VITAMIN D) 1000 units tablet Take 5,000 Units by mouth daily.   03/08/2018 at 0800  . cyclobenzaprine (FLEXERIL) 10 MG tablet Take 10 mg by mouth 3 (three)  times daily as needed for muscle pain.   Past Week at PRN  . ezetimibe (ZETIA) 10 MG tablet Take 10 mg by mouth daily.   03/08/2018 at 0800  . famotidine (PEPCID) 20 MG tablet Take 20 mg by mouth daily.   03/08/2018 at 0800  . FLUoxetine (PROZAC) 20 MG capsule Take 60 mg by mouth at bedtime.    03/07/2018 at 2000  . hydrALAZINE (APRESOLINE) 25 MG tablet Take 25 mg by mouth daily.   03/08/2018 at 0800  . insulin aspart (NOVOLOG) 100 UNIT/ML injection Inject 15 Units into the skin 3 (three) times daily with meals. 10 mL 11 03/08/2018 at 0800  . insulin detemir (LEVEMIR) 100 UNIT/ML injection Inject 0.4 mLs (40 Units total) into the skin 2 (two) times daily. 10 mL 11 03/08/2018 at 0800  . nitroGLYCERIN (NITROSTAT) 0.4 MG SL tablet Place 0.4 mg under the tongue every 5 (five) minutes as needed for chest pain.   Unknown at PRN  . RENVELA 800 MG tablet Take 1,600 mg by mouth 3 (three) times daily with meals.   03/08/2018 at 0800  . traMADol (ULTRAM) 50 MG tablet Take 1 tablet (50 mg total) by mouth every 6 (six) hours as needed. 30 tablet 0 Past Week at PRN  . VOLTAREN 1 % GEL Apply 2 g topically 3 (three) times daily.   03/08/2018 at 0800  . warfarin (COUMADIN) 6 MG tablet Take 24 mg by mouth every evening.    03/07/2018 at 2000    Assessment: Pharmacy consulted to dose  heparin in this 47 year old male with NSTEMI.  Pt was on warfarin previously but subtherapeutic INR.  Hgb trending down slightly but no reports of bleeding (13>10.2). I spoke with the patient and confirmed that he takes #4 6mg  warfarin tablets. He admits to no missed doses but INR was less than 1.0 on admission.  Heparin Course: 12/09: initiation bolus 4000 units then 1500 units/hr 12/10 0130 HL<0.10: bolus 3600 units, then inc to 1650 units/hr 12/10 0805 HL 0.30 12/11 0230 HL 0.21: 1800 unit bolus then inc to 2000 units/hr 12/11 1007 HL 0.38 12/12 0419 HL 0.27: 1800 unit bolus then inc to 2200 units/hr 12/12 1204 HL 0.41  Goal of Therapy:  Heparin level 0.3-0.7 units/ml Monitor platelets by anticoagulation protocol: Yes   Plan:  First therapeutic level: maintain drip rate at 2200 units/hr.  Will recheck HL in 6 hrs    Dallie Piles, PharmD Clinical Pharmacist 03/11/2018 1:31 PM

## 2018-03-11 NOTE — Progress Notes (Signed)
Progress Note  [name] Date of Encounter: 03/11/2018  Primary Cardiologist:   Subjective   No chest pain today.  The patient is awaiting transfer to [facility].  He is frustrated with the wait and is talking about being discharged home.  Inpatient Medications    Scheduled Meds: . allopurinol  50 mg Oral QODAY  . amiodarone  200 mg Oral Daily  . amLODipine  10 mg Oral Daily  . aspirin EC  81 mg Oral Daily  . atorvastatin  80 mg Oral QPM  . carvedilol  25 mg Oral q morning - 10a   And  . carvedilol  50 mg Oral QHS  . cholecalciferol  5,000 Units Oral Daily  . ezetimibe  10 mg Oral Daily  . famotidine  20 mg Oral Daily  . FLUoxetine  60 mg Oral QHS  . hydrALAZINE  25 mg Oral Daily  . insulin aspart  0-20 Units Subcutaneous TID WC  . insulin aspart  0-5 Units Subcutaneous QHS  . insulin aspart  15 Units Subcutaneous TID WC  . insulin detemir  40 Units Subcutaneous BID  . isosorbide mononitrate  30 mg Oral Daily  . sevelamer carbonate  1,600 mg Oral TID WC  . sodium chloride flush  3 mL Intravenous Q12H  . sodium chloride flush  3 mL Intravenous Q12H   Continuous Infusions: . sodium chloride    . heparin 2,200 Units/hr (03/11/18 0615)   PRN Meds: sodium chloride, acetaminophen, ALPRAZolam, cyclobenzaprine, hydrALAZINE, ondansetron (ZOFRAN) IV, sodium chloride flush, traMADol   Vital Signs    Vitals:   03/10/18 1806 03/10/18 1921 03/11/18 0452 03/11/18 0738  BP: 124/75 110/60 113/66 (!) 117/55  Pulse: 72 76 70 70  Resp: 18   17  Temp: 97.8 F (36.6 C) 98.5 F (36.9 C) 98.5 F (36.9 C)   TempSrc: Oral Oral Oral   SpO2: 99% 97% 98% 97%  Weight:      Height:        Intake/Output Summary (Last 24 hours) at 03/11/2018 0853 Last data filed at 03/11/2018 0200 Gross per 24 hour  Intake 808 ml  Output 1000 ml  Net -192 ml   Filed Weights   03/08/18 2226 03/09/18 1041 03/10/18 0505  Weight: (!) 151.3 kg (!) 152 kg (!) 152.3 kg    Telemetry    Normal sinus rhythm with intermittent pacing- Personally Reviewed  ECG    Not done today.  Physical Exam   GEN: Morbidly obese.  No acute distress. Neck: JVD difficult to assess d/t body habitus  Cardiac: RRR, no murmurs, rubs, or gallops.  Respiratory: Clear to auscultation bilaterally. GI: Obese - Soft, nontender, non-distended  MS: No edema; No deformity. Neuro:  Nonfocal  Psych: Normal affect.  Labs    Chemistry Recent Labs  Lab 03/08/18 1822 03/09/18 0805 03/10/18 0226  NA 132* 134* 133*  K 4.1 4.2 4.6  CL 103 107 105  CO2 19* 19* 21*  GLUCOSE 433* 202* 207*  BUN 29* 29* 30*  CREATININE 2.77* 2.78* 2.97*  CALCIUM 8.8* 8.1* 8.3*  GFRNONAA 26* 26* 24*  GFRAA 30* 30* 27*  ANIONGAP 10 8 7      Hematology Recent Labs  Lab 03/09/18 0450 03/10/18 0226 03/11/18 0419  WBC 9.7 9.9 9.9  RBC 4.03* 4.12* 3.80*  HGB 10.5* 10.9* 10.2*  HCT 33.7* 34.8* 32.5*  MCV 83.6 84.5 85.5  MCH 26.1 26.5 26.8  MCHC 31.2 31.3 31.4  RDW 15.9* 16.1*  16.1*  PLT 202 200 216    Cardiac Enzymes Recent Labs  Lab 03/08/18 2308 03/09/18 0139 03/09/18 0450 03/10/18 1055  TROPONINI 1.17* 1.38* 1.72* 0.73*   No results for input(s): TROPIPOC in the last 168 hours.   BNPNo results for input(s): BNP, PROBNP in the last 168 hours.   DDimer No results for input(s): DDIMER in the last 168 hours.   Radiology    No results found.  Cardiac Studies   03/10/2018 TTE Study Conclusions - Procedure narrative: Transthoracic echocardiography. Image   quality was suboptimal. The study was technically difficult. - Left ventricle: The cavity size was normal. Wall thickness was   increased in a pattern of moderate LVH. There was moderate   concentric hypertrophy. Systolic function was moderately reduced.   The estimated ejection fraction was in the range of 35% to 40%.   The study is not technically sufficient to allow evaluation of LV   diastolic  function.  03/09/2018 LHC Conclusions: 1. Significant three-vessel coronary artery disease including multifocal in-stent restenosis of up to 50-60% involving the mid LAD, 80% ostial stenosis of large D2 branch, which is jailed by LAD stent, 70% proximal LCx stenosis involving OM1, 60% mid RCA stenosis, and complex distal RCA bifurcation lesion of up to 90% stenosis ([place] 1,1,1). 2. Mildly to moderately elevated left ventricular filling pressure (LVEDP 20 to 25 mmHg). Recommendations: 1. Progression of multivessel coronary artery disease compared with cath report at [facility] a year ago.  There is also moderate in-stent restenosis involving the LAD.  Given three-vessel disease and diabetes, surgical consultation for CABG is recommended.  Given that the patient has a complex cardiac history and was hospitalized for almost 3 months at [facility] last year, we will arrange for transfer to [facility] for further management. 2. Discontinue ticagrelor and initiate aspirin 81 mg daily. 3. Restart heparin infusion 2 hours after TR band removal. 4. Aggressive secondary prevention. 5. Obtain echo to assess LVEF. (see directly above) 6. Close monitoring of renal function, given CKD stage IV.  Diagnostic  Dominance: Right      LHC/PCI (03/27/17 - [facility]): 1. Significant 2-vessel coronary artery disease.There is a 90% stenosis  of the proximal LAD with TIMI 1 flow into the distal vessel. There is a  70% stenosis of the ostial DIAG2 vessel which was treated with balloon  angiogplasty. There is a 70% stenosis of the proximal LCx and there is  mild-moderate RCA disease.  2. Decreased LV Function by previous echoEF estimated 30 %.LVEDP 35 mm  Hg 3. PCI was performed of the proximal and mid 90% LAD lesion using Resolute  Onyx (drug eluting stent) x 2. Good angiographic result with TIMI 3 flow 4. Impella was placed for cardiogenic shock prior to PCI 5. Right heart cath after Impella placement- RA mean 11, RV  40/8,PA  41/21 mean 29,PCWP mean 12 6. PA sat 58, Fick CO/CI 4.6/1.7   Echo (04/08/17 [facility]):  Mildly decreased left ventricular systolic function, ejection fraction  45%  Dilated left atrium - mild  Normal right ventricular systolic function  Dilated right atrium - mild  No significant valvular abnormalities  Technically difficult study due to chest wall/lung interference  Ultrasound enhancing agent utilized to improve endocardial border  definition  Patient Profile     47 y.o. male with a history of CAD with STEMI in 62/8315 and NSTEMI complicated by cardiogenic shock following cardiac arrest with VT.  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  Assessment & Plan    NSTEMI s/p 12/10 LHC, CAD  - 12/10 LHC as above with 3v disease including moderate in-stent restenosis involving the LAD. Mildly to moderately elevated LVEDP 20-25% mmHG. - Awaiting transfer to [facility] - given 3v disease and DM2, surgical consultation for CABG recommended. Recommend continued close monitoring of renal function given CKD IV  As above.  - Continue medical mangement with ASA 81mg  daily, Coreg, Zetia 10mg , atorvastatin.  Ticagrelor was discontinued and asa started  Continue unfractionated heparin. I advised the patient against going home given his significant underlying three-vessel coronary artery disease on presentation with non-ST elevation myocardial infarction.  He will require revascularization before going home.  Chronic HFrEF - Most recent echo as above showing  EF 35-40% and as above No ACE inhibitor or ARB given advanced chronic kidney disease.   PAF - CHA2DS2VASc score of at least 4 (CHF, HTN, DM2, vascular) on PTA warfarin for anticoagulation -Continue to hold warfarin and continue unfractionated heparin.  DM2 - SSI  CKD  -Should check basic metabolic profile today if he does not get transferred.  HLD - Atorvastatin as above - Pending updated lipid panel  HTN - BP controlled as above   For questions or updates, please contact [place] Please consult [email or web address] for contact info under        Signed, Kathlyn Sacramento, MD  03/11/2018, 8:53 AM

## 2018-03-11 NOTE — Progress Notes (Signed)
ANTICOAGULATION CONSULT NOTE  Pharmacy Consult for Heparin  Indication: chest pain/ACS  Patient Measurements: Height: 6\' 3"  (190.5 cm) Weight: (!) 335 lb 12.8 oz (152.3 kg) IBW/kg (Calculated) : 84.5 Heparin Dosing Weight: 118.2 kg   Vital Signs: Temp: 98.3 F (36.8 C) (12/12 1629) Temp Source: Oral (12/12 1629) BP: 124/63 (12/12 1629) Pulse Rate: 71 (12/12 1629)  Labs: Recent Labs    03/09/18 0139  03/09/18 0450 03/09/18 0805 03/10/18 0226 03/10/18 1007 03/10/18 1055  03/11/18 0419 03/11/18 1204 03/11/18 1834  HGB  --    < > 10.5*  --  10.9*  --   --   --  10.2*  --   --   HCT  --   --  33.7*  --  34.8*  --   --   --  32.5*  --   --   PLT  --   --  202  --  200  --   --   --  216  --   --   LABPROT  --   --   --   --   --  15.1  --   --  14.8  --   --   INR  --   --   --   --   --  1.20  --   --  1.17  --   --   HEPARINUNFRC <0.10*  --   --  0.30 0.21* 0.38  --    < > 0.27* 0.41 0.41  CREATININE  --   --   --  2.78* 2.97*  --   --   --   --  3.37*  --   TROPONINI 1.38*  --  1.72*  --   --   --  0.73*  --   --   --   --    < > = values in this interval not displayed.    Estimated Creatinine Clearance: 41.9 mL/min (A) (by C-G formula based on SCr of 3.37 mg/dL (H)).   Medical History: Past Medical History:  Diagnosis Date  . CAD (coronary artery disease)   . Chronic systolic heart failure ([place])   . Diabetes mellitus without complication ([place])   . Heart attack ([place])   . Hyperlipidemia   . Hypertension   . Ischemic cardiomyopathy   . Paroxysmal atrial fibrillation ([name])     Medications:  Medications Prior to Admission  Medication Sig Dispense Refill Last Dose  . allopurinol (ZYLOPRIM) 100 MG tablet Take 0.5 tablets (50 mg total) by mouth every other day. 7.5 tablet 1 03/08/2018 at 0800  . amiodarone (PACERONE) 200 MG tablet Take 200 mg by mouth daily.   03/08/2018 at 0800  . amLODipine (NORVASC) 10 MG tablet Take 10 mg by mouth daily.   03/08/2018 at 0800  .  atorvastatin (LIPITOR) 80 MG tablet Take 80 mg by mouth every evening.    03/07/2018 at 2000  . BRILINTA 90 MG TABS tablet Take 90 mg by mouth 2 (two) times daily.   03/08/2018 at 0800  . bumetanide (BUMEX) 2 MG tablet Take 2 mg by mouth daily.   03/08/2018 at 0800  . carvedilol (COREG) 25 MG tablet Take 25-50 mg by mouth See admin instructions. Take 1 tablet (25MG ) by mouth every morning and 2 tablets (50MG ) by mouth every night   03/08/2018 at 0800  . cholecalciferol (VITAMIN D) 1000 units tablet Take 5,000 Units by mouth daily.   03/08/2018 at 0800  .  cyclobenzaprine (FLEXERIL) 10 MG tablet Take 10 mg by mouth 3 (three) times daily as needed for muscle pain.   Past Week at PRN  . ezetimibe (ZETIA) 10 MG tablet Take 10 mg by mouth daily.   03/08/2018 at 0800  . famotidine (PEPCID) 20 MG tablet Take 20 mg by mouth daily.   03/08/2018 at 0800  . FLUoxetine (PROZAC) 20 MG capsule Take 60 mg by mouth at bedtime.    03/07/2018 at 2000  . hydrALAZINE (APRESOLINE) 25 MG tablet Take 25 mg by mouth daily.   03/08/2018 at 0800  . insulin aspart (NOVOLOG) 100 UNIT/ML injection Inject 15 Units into the skin 3 (three) times daily with meals. 10 mL 11 03/08/2018 at 0800  . insulin detemir (LEVEMIR) 100 UNIT/ML injection Inject 0.4 mLs (40 Units total) into the skin 2 (two) times daily. 10 mL 11 03/08/2018 at 0800  . nitroGLYCERIN (NITROSTAT) 0.4 MG SL tablet Place 0.4 mg under the tongue every 5 (five) minutes as needed for chest pain.   Unknown at PRN  . RENVELA 800 MG tablet Take 1,600 mg by mouth 3 (three) times daily with meals.   03/08/2018 at 0800  . traMADol (ULTRAM) 50 MG tablet Take 1 tablet (50 mg total) by mouth every 6 (six) hours as needed. 30 tablet 0 Past Week at PRN  . VOLTAREN 1 % GEL Apply 2 g topically 3 (three) times daily.   03/08/2018 at 0800  . warfarin (COUMADIN) 6 MG tablet Take 24 mg by mouth every evening.    03/07/2018 at 2000    Assessment: Pharmacy consulted to dose heparin in this 47 year  old male with NSTEMI.  Pt was on warfarin previously but subtherapeutic INR.  Hgb trending down slightly but no reports of bleeding (13>10.2). I spoke with the patient and confirmed that he takes #4 6mg  warfarin tablets. He admits to no missed doses but INR was less than 1.0 on admission.  Heparin Course: 12/09: initiation bolus 4000 units then 1500 units/hr 12/10 0130 HL<0.10: bolus 3600 units, then inc to 1650 units/hr 12/10 0805 HL 0.30 12/11 0230 HL 0.21: 1800 unit bolus then inc to 2000 units/hr 12/11 1007 HL 0.38 12/12 0419 HL 0.27: 1800 unit bolus then inc to 2200 units/hr 12/12 1204 HL 0.41 12/12 1834 HL 0.41  Goal of Therapy:  Heparin level 0.3-0.7 units/ml Monitor platelets by anticoagulation protocol: Yes   Plan:  Second therapeutic level: maintain drip rate at 2200 units/hr.  Will recheck HL with AM labs, daily CBC.    Paulina Fusi, PharmD, BCPS 03/11/2018 7:02 PM

## 2018-03-11 NOTE — Progress Notes (Signed)
[place] for Heparin  Indication: chest pain/ACS  Allergies  Allergen Reactions  . Morphine And Related     Overdose in hospital per patient report    Patient Measurements: Height: 6\' 3"  (190.5 cm) Weight: (!) 335 lb 12.8 oz (152.3 kg) IBW/kg (Calculated) : 84.5 Heparin Dosing Weight: 118.2 kg   Vital Signs: Temp: 98.5 F (36.9 C) (12/12 0452) Temp Source: Oral (12/12 0452) BP: 113/66 (12/12 0452) Pulse Rate: 70 (12/12 0452)  Labs: Recent Labs    03/08/18 1822  03/09/18 0139 03/09/18 0450 03/09/18 0805 03/10/18 0226 03/10/18 1007 03/10/18 1055 03/10/18 1550 03/11/18 0419  HGB 13.0  --   --  10.5*  --  10.9*  --   --   --  10.2*  HCT 40.6  --   --  33.7*  --  34.8*  --   --   --  32.5*  PLT 247  --   --  202  --  200  --   --   --  216  LABPROT 12.1  --   --   --   --   --  15.1  --   --  14.8  INR 0.90  --   --   --   --   --  1.20  --   --  1.17  HEPARINUNFRC  --    < > <0.10*  --  0.30 0.21* 0.38  --  0.40 0.27*  CREATININE 2.77*  --   --   --  2.78* 2.97*  --   --   --   --   TROPONINI 0.59*   < > 1.38* 1.72*  --   --   --  0.73*  --   --    < > = values in this interval not displayed.    Estimated Creatinine Clearance: 47.5 mL/min (A) (by C-G formula based on SCr of 2.97 mg/dL (H)).   Medical History: Past Medical History:  Diagnosis Date  . CAD (coronary artery disease)   . Chronic systolic heart failure ([place])   . Diabetes mellitus without complication ([place])   . Heart attack ([place])   . Hyperlipidemia   . Hypertension   . Ischemic cardiomyopathy   . Paroxysmal atrial fibrillation ([name])     Medications:  Medications Prior to Admission  Medication Sig Dispense Refill Last Dose  . allopurinol (ZYLOPRIM) 100 MG tablet Take 0.5 tablets (50 mg total) by mouth every other day. 7.5 tablet 1 03/08/2018 at 0800  . amiodarone (PACERONE) 200 MG tablet Take 200 mg by mouth daily.   03/08/2018 at 0800  . amLODipine (NORVASC)  10 MG tablet Take 10 mg by mouth daily.   03/08/2018 at 0800  . atorvastatin (LIPITOR) 80 MG tablet Take 80 mg by mouth every evening.    03/07/2018 at 2000  . BRILINTA 90 MG TABS tablet Take 90 mg by mouth 2 (two) times daily.   03/08/2018 at 0800  . bumetanide (BUMEX) 2 MG tablet Take 2 mg by mouth daily.   03/08/2018 at 0800  . carvedilol (COREG) 25 MG tablet Take 25-50 mg by mouth See admin instructions. Take 1 tablet (25MG ) by mouth every morning and 2 tablets (50MG ) by mouth every night   03/08/2018 at 0800  . cholecalciferol (VITAMIN D) 1000 units tablet Take 5,000 Units by mouth daily.   03/08/2018 at 0800  . cyclobenzaprine (FLEXERIL) 10 MG tablet Take 10 mg by mouth 3 (three)  times daily as needed for muscle pain.   Past Week at PRN  . ezetimibe (ZETIA) 10 MG tablet Take 10 mg by mouth daily.   03/08/2018 at 0800  . famotidine (PEPCID) 20 MG tablet Take 20 mg by mouth daily.   03/08/2018 at 0800  . FLUoxetine (PROZAC) 20 MG capsule Take 60 mg by mouth at bedtime.    03/07/2018 at 2000  . hydrALAZINE (APRESOLINE) 25 MG tablet Take 25 mg by mouth daily.   03/08/2018 at 0800  . insulin aspart (NOVOLOG) 100 UNIT/ML injection Inject 15 Units into the skin 3 (three) times daily with meals. 10 mL 11 03/08/2018 at 0800  . insulin detemir (LEVEMIR) 100 UNIT/ML injection Inject 0.4 mLs (40 Units total) into the skin 2 (two) times daily. 10 mL 11 03/08/2018 at 0800  . nitroGLYCERIN (NITROSTAT) 0.4 MG SL tablet Place 0.4 mg under the tongue every 5 (five) minutes as needed for chest pain.   Unknown at PRN  . RENVELA 800 MG tablet Take 1,600 mg by mouth 3 (three) times daily with meals.   03/08/2018 at 0800  . traMADol (ULTRAM) 50 MG tablet Take 1 tablet (50 mg total) by mouth every 6 (six) hours as needed. 30 tablet 0 Past Week at PRN  . VOLTAREN 1 % GEL Apply 2 g topically 3 (three) times daily.   03/08/2018 at 0800  . warfarin (COUMADIN) 6 MG tablet Take 24 mg by mouth every evening.    03/07/2018 at 2000     Assessment: Pharmacy consulted to dose heparin in this 47 year old male with NSTEMI.  Pt was on warfarin previously but subtherapeutic INR.  Troponins are still trending upward. Hgb trending down slightly but no reports of bleeding (13>10.5). I spoke with the patient and confirmed that he takes #4 6mg  warfarin tablets. He admits to no missed doses but INR is less than 1.0.  Heparin Course: 12/09: initiation bolus 4000 units then 1500 units/hr 12/10 0130 HL<0.10: bolus 3600 units, then inc to 1650 units/hr 12/10 0805 HL 0.30 12/11:  HL @ 0230 = 0.21 Will order Heparin 1800 units IV X 1 and increase drip rate to 2000 units/hr.  Will recheck HL 6 hrs after rate change.  Patient went for Cath, to restart Heparin 2 hours post TR band removal. Band removed at 18:25.   Goal of Therapy:  Heparin level 0.3-0.7 units/ml Monitor platelets by anticoagulation protocol: Yes   Plan:  12/11: 1007 HL= 0.38,  1550 HL = 0.40  12/12: HL @ 0.27. Will order Heparin 1800 units IV X 1 and increase drip rate to 2200 units/hr.  Will recheck HL 6 hrs after rate change.    Orene Desanctis, PharmD Clinical Pharmacist 03/11/2018 5:46 AM

## 2018-03-11 NOTE — Discharge Summary (Signed)
[place] at [place] NAME: Edwin [name]    MR#:  [id number]  DATE OF BIRTH:  1970-05-01  DATE OF ADMISSION:  03/08/2018 ADMITTING PHYSICIAN: Gorden Harms, MD  DATE OF DISCHARGE: 03/10/2018  PRIMARY CARE PHYSICIAN: Patient, No Pcp Per   We will transfer to [facility] when the bed is available. ADMISSION DIAGNOSIS:  Hyperglycemia [R73.9] NSTEMI (non-ST elevated myocardial infarction) ([place]) [I21.4] Hypertensive emergency [I16.1]  DISCHARGE DIAGNOSIS:  Acute NSTEMI---transfer to [facility] for CABG  SECONDARY DIAGNOSIS:   Past Medical History:  Diagnosis Date  . CAD (coronary artery disease)   . Chronic systolic heart failure ([place])   . Diabetes mellitus without complication ([place])   . Heart attack ([place])   . Hyperlipidemia   . Hypertension   . Ischemic cardiomyopathy   . Paroxysmal atrial fibrillation [facility])     HOSPITAL COURSE:   Edwin [name] a47 y.o.malewith a known history per below presents emergency room with chest pain described as pressure radiating into his neck/left arm/shoulders, similar to last heart attack per patient, associated with emesis, shortness of breath, lasted 5 to 10 minutes, in the emergency room patient was noted to be tachycardic, hypertensive, sinus tachycardia noted on EKG   *Acute non-STEMI - Brilinta, aspirin, Coreg --cont IVheparin drip, supplemental oxygen PRN, prn nitroglycerin  - cardiology consultation with Dr end noted--s/p  cath Significant three-vessel coronary artery disease including multifocal in-stent restenosis of up to 50-60% involving the mid LAD, 80% ostial stenosis of large D2 branch, which is jailed by LAD stent, 70% proximal LCx stenosis involving OM1, 60% mid RCA stenosis, and complex distal RCA bifurcation lesion of up to 90% stenosis ([place] 1,1,1).Mildly to moderately elevated left ventricular filling pressure (LVEDP 20 to 25 mmHg).  * hyperglycemia with chronic diabetes mellitus type  2 -Continue home regiment, start high-dose sliding scale insulin with Accu-Cheks per routine, heart healthy/carbohydrate consistent diet -diabetic educator while in house  * malignant hypertension -Continue home regimen IV Lopressor 5 mg x1 now-schedule every 6 hours PRN, IV hydralazine PRN resistant hypertension, vitals per routine, make changes as per necessary -resumed home meds  *hyperlipidemia, unspecified -Continue statin/zetia  *Chronic paroxysmal A. Fib -Continue amiodarone, beta-blocker therapy, not therapeutic on Coumadin (takes coumadin 24 mg q pm !!!)-currently on heparin drip. Coumadin held  *Chronic diabetic and hypertensive nephropathy, chronic kidney disease stage IV Avoid nephrotoxic agents  pt will be transferred to [facility] once bed is available for CABG. Accepting physician Dr Caren Hazy.  Pt aware and risks and complications discussed for transfer CONSULTS OBTAINED:  Treatment Team:  Nelva Bush, MD  DRUG ALLERGIES:   Allergies  Allergen Reactions  . Morphine And Related     Overdose in hospital per patient report    DISCHARGE MEDICATIONS:   Allergies as of 03/11/2018      Reactions   Morphine And Related    Overdose in hospital per patient report      Medication List    STOP taking these medications   warfarin 6 MG tablet Commonly known as:  COUMADIN     TAKE these medications   allopurinol 100 MG tablet Commonly known as:  ZYLOPRIM Take 0.5 tablets (50 mg total) by mouth every other day.   amiodarone 200 MG tablet Commonly known as:  PACERONE Take 200 mg by mouth daily.   amLODipine 10 MG tablet Commonly known as:  NORVASC Take 10 mg by mouth daily.   aspirin 81 MG EC tablet Take 1  tablet (81 mg total) by mouth daily.   atorvastatin 80 MG tablet Commonly known as:  LIPITOR Take 80 mg by mouth every evening.   BRILINTA 90 MG Tabs tablet Generic drug:  ticagrelor Take 90 mg by mouth 2 (two) times daily.   bumetanide 2  MG tablet Commonly known as:  BUMEX Take 2 mg by mouth daily.   carvedilol 25 MG tablet Commonly known as:  COREG Take 25-50 mg by mouth See admin instructions. Take 1 tablet (25MG ) by mouth every morning and 2 tablets (50MG ) by mouth every night   cholecalciferol 25 MCG (1000 UT) tablet Commonly known as:  VITAMIN D Take 5,000 Units by mouth daily.   cyclobenzaprine 10 MG tablet Commonly known as:  FLEXERIL Take 10 mg by mouth 3 (three) times daily as needed for muscle pain.   ezetimibe 10 MG tablet Commonly known as:  ZETIA Take 10 mg by mouth daily.   famotidine 20 MG tablet Commonly known as:  PEPCID Take 20 mg by mouth daily.   FLUoxetine 20 MG capsule Commonly known as:  PROZAC Take 60 mg by mouth at bedtime.   heparin 25000-0.45 UT/250ML-% infusion Inject 2,000 Units/hr into the vein continuous.   hydrALAZINE 25 MG tablet Commonly known as:  APRESOLINE Take 25 mg by mouth daily.   insulin aspart 100 UNIT/ML injection Commonly known as:  novoLOG Inject 15 Units into the skin 3 (three) times daily with meals.   insulin detemir 100 UNIT/ML injection Commonly known as:  LEVEMIR Inject 0.4 mLs (40 Units total) into the skin 2 (two) times daily.   isosorbide mononitrate 30 MG 24 hr tablet Commonly known as:  IMDUR Take 1 tablet (30 mg total) by mouth daily. Start taking on:  March 12, 2018   nitroGLYCERIN 0.4 MG SL tablet Commonly known as:  NITROSTAT Place 0.4 mg under the tongue every 5 (five) minutes as needed for chest pain.   RENVELA 800 MG tablet Generic drug:  sevelamer carbonate Take 1,600 mg by mouth 3 (three) times daily with meals.   traMADol 50 MG tablet Commonly known as:  ULTRAM Take 1 tablet (50 mg total) by mouth every 6 (six) hours as needed.   VOLTAREN 1 % Gel Generic drug:  diclofenac sodium Apply 2 g topically 3 (three) times daily.       If you experience worsening of your admission symptoms, develop shortness of breath, life  threatening emergency, suicidal or homicidal thoughts you must seek medical attention immediately by calling 911 or calling your MD immediately  if symptoms less severe.  You Must read complete instructions/literature along with all the possible adverse reactions/side effects for all the Medicines you take and that have been prescribed to you. Take any new Medicines after you have completely understood and accept all the possible adverse reactions/side effects.   Please note  You were cared for by a hospitalist during your hospital stay. If you have any questions about your discharge medications or the care you received while you were in the hospital after you are discharged, you can call the unit and asked to speak with the hospitalist on call if the hospitalist that took care of you is not available. Once you are discharged, your primary care physician will handle any further medical issues. Please note that NO REFILLS for any discharge medications will be authorized once you are discharged, as it is imperative that you return to your primary care physician (or establish a relationship with a primary  care physician if you do not have one) for your aftercare needs so that they can reassess your need for medications and monitor your lab values. Today   SUBJECTIVE   Doing well. No CP  VITAL SIGNS:  Blood pressure (!) 117/55, pulse 70, temperature 98.5 F (36.9 C), temperature source Oral, resp. rate 17, height 6\' 3"  (1.905 m), weight (!) 152.3 kg, SpO2 97 %.  I/O:    Intake/Output Summary (Last 24 hours) at 03/11/2018 1040 Last data filed at 03/11/2018 0948 Gross per 24 hour  Intake 1045 ml  Output 600 ml  Net 445 ml    PHYSICAL EXAMINATION:  GENERAL:  47 y.o.-year-old patient lying in the bed with no acute distress. OBESE EYES: Pupils equal, round, reactive to light and accommodation. No scleral icterus. Extraocular muscles intact.  HEENT: Head atraumatic, normocephalic. Oropharynx and  nasopharynx clear.  NECK:  Supple, no jugular venous distention. No thyroid enlargement, no tenderness.  LUNGS: Normal breath sounds bilaterally, no wheezing, rales,rhonchi or crepitation. No use of accessory muscles of respiration.  CARDIOVASCULAR: S1, S2 normal. No murmurs, rubs, or gallops.  ABDOMEN: Soft, non-tender, non-distended. Bowel sounds present. No organomegaly or mass.  EXTREMITIES: No pedal edema, cyanosis, or clubbing.  NEUROLOGIC: Cranial nerves II through XII are intact. Muscle strength 5/5 in all extremities. Sensation intact. Gait not checked.  PSYCHIATRIC: The patient is alert and oriented x 3.  SKIN: No obvious rash, lesion, or ulcer.   DATA REVIEW:   CBC  Recent Labs  Lab 03/11/18 0419  WBC 9.9  HGB 10.2*  HCT 32.5*  PLT 216    Chemistries  Recent Labs  Lab 03/10/18 0226  NA 133*  K 4.6  CL 105  CO2 21*  GLUCOSE 207*  BUN 30*  CREATININE 2.97*  CALCIUM 8.3*    Microbiology Results   No results found for this or any previous visit (from the past 240 hour(s)).  RADIOLOGY:  No results found.   Management plans discussed with the patient, family and they are in agreement.  CODE STATUS:     Code Status Orders  (From admission, onward)         Start     Ordered   03/08/18 2246  Full code  Continuous     03/08/18 2245        Code Status History    Date Active Date Inactive Code Status Order ID Comments User Context   12/16/2017 2226 12/17/2017 1550 Full Code [id number]  Thornton Park, MD Inpatient      TOTAL TIME TAKING CARE OF THIS PATIENT; 40-minutes   Epifanio Lesches M.D on 03/11/2018 at 10:40 AM  Between 7am to 6pm - Pager - [phone] After 6pm go to [email or web address] - password [email or web address] [place] [facility]  Office  [phone]  CC: Primary care physician; Patient, No Pcp Per

## 2018-03-11 NOTE — Progress Notes (Signed)
This RN contacted [facility] transfer center to see status of bed placement. Currently patient has no bed available at [facility] but is still on the wait list. [facility] to contact as soon as bed becomes available.

## 2018-03-11 NOTE — Progress Notes (Signed)
Report given to [facility] accepting RN as well as air care. [facility] to look to provide transportation for patient. Will notify when transportation is available.

## 2018-03-11 NOTE — Progress Notes (Signed)
[place] at [place] NAME: Edwin [name]    MR#:  [id number]  DATE OF BIRTH:  Oct 23, 1970  SUBJECTIVE:   Chest pain pain-waiting for bed at [facility].  Wants to go home. REVIEW OF SYSTEMS:   Review of Systems  Constitutional: Negative for chills, fever and weight loss.  HENT: Negative for ear discharge, ear pain and nosebleeds.   Eyes: Negative for blurred vision, pain and discharge.  Respiratory: Negative for sputum production, shortness of breath, wheezing and stridor.   Cardiovascular: Positive for chest pain. Negative for palpitations, orthopnea and PND.  Gastrointestinal: Negative for abdominal pain, diarrhea, nausea and vomiting.  Genitourinary: Negative for frequency and urgency.  Musculoskeletal: Negative for back pain and joint pain.  Neurological: Negative for sensory change, speech change, focal weakness and weakness.  Psychiatric/Behavioral: Negative for depression and hallucinations. The patient is not nervous/anxious.    Tolerating Diet:npo Tolerating PT: not needed  DRUG ALLERGIES:   Allergies  Allergen Reactions  . Morphine And Related     Overdose in hospital per patient report    VITALS:  Blood pressure (!) 117/55, pulse 70, temperature 98.5 F (36.9 C), temperature source Oral, resp. rate 17, height 6\' 3"  (1.905 m), weight (!) 152.3 kg, SpO2 97 %.  PHYSICAL EXAMINATION:   Physical Exam  GENERAL:  47 y.o.-year-old patient lying in the bed with no acute distress. Obese EYES: Pupils equal, round, reactive to light and accommodation. No scleral icterus. Extraocular muscles intact.  HEENT: Head atraumatic, normocephalic. Oropharynx and nasopharynx clear.  NECK:  Supple, no jugular venous distention. No thyroid enlargement, no tenderness.  LUNGS: Normal breath sounds bilaterally, no wheezing, rales, rhonchi. No use of accessory muscles of respiration.  CARDIOVASCULAR: S1, S2 normal. No murmurs, rubs, or gallops.   ABDOMEN: Soft, nontender, nondistended. Bowel sounds present. No organomegaly or mass. Morbid obeisty EXTREMITIES: No cyanosis, clubbing or edema b/l.    NEUROLOGIC: Cranial nerves II through XII are intact. No focal Motor or sensory deficits b/l.   PSYCHIATRIC:  patient is alert and oriented x 3.  SKIN: No obvious rash, lesion, or ulcer.   LABORATORY PANEL:  CBC Recent Labs  Lab 03/11/18 0419  WBC 9.9  HGB 10.2*  HCT 32.5*  PLT 216    Chemistries  Recent Labs  Lab 03/10/18 0226  NA 133*  K 4.6  CL 105  CO2 21*  GLUCOSE 207*  BUN 30*  CREATININE 2.97*  CALCIUM 8.3*   Cardiac Enzymes Recent Labs  Lab 03/10/18 1055  TROPONINI 0.73*   RADIOLOGY:  No results found. ASSESSMENT AND PLAN:   Edwin [name]  is a 47 y.o. male with a known history per below presents emergency room with chest pain described as pressure radiating into his neck/left arm/shoulders, similar to last heart attack per patient, associated with emesis, shortness of breath, lasted 5 to 10 minutes, in the emergency room patient was noted to be tachycardic, hypertensive, sinus tachycardia noted on EKG   *Acute non-STEMI  --cont IVheparin drip, supplemental oxygen PRN, scheduled nitroglycerin to chest, blood pressure control per below Significant three-vessel disease by cardiac cath with multifocal in-stent stenosis, 80% ostial stenosis, given complex cardiac history, hospitalized 3 months ago at [facility] last year for cardiac arrest, patient needs to go to [facility] for CABG.  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  *Chronic hyperlipidemia, unspecified -Continue statin therapy  *Chronic paroxysmal A. Fib -Continue amiodarone, beta-blocker therapy, not therapeutic on Coumadin (takes coumadin 24 mg q pm !!!)-currently on heparin drip  *Chronic diabetic and hypertensive nephropathy, chronic kidney disease stage IV Avoid nephrotoxic agents   Case discussed with Care Management/Social Worker. Management plans discussed with the patient, family and they are in agreement.  CODE STATUS: full  DVT Prophylaxis: heparin gtt  TOTAL TIME TAKING CARE OF THIS PATIENT: *30* minutes.  >50% time spent on counselling and coordination of care  POSSIBLE D/C IN *1-2* DAYS, DEPENDING ON CLINICAL CONDITION.  Note: This dictation was prepared with Dragon dictation along with smaller phrase technology. Any transcriptional errors that result from this process are unintentional.  Epifanio Lesches M.D on 03/11/2018 at 10:41 AM  Between 7am to 6pm - Pager - [phone]  After 6pm go to [email or web address] - password [email or web address] [place] [facility]  Office  [phone]  CC: Primary care physician; Patient, No Pcp PerPatient ID: Edwin [name], male   DOB: 1970/06/01, 47 y.o.   MRN: [id number]

## 2018-03-12 ENCOUNTER — Ambulatory Visit
Admit: 2018-03-12 | Discharge: 2018-03-14 | Disposition: A | Discharge status: Home / Self Care | Payer: MEDICARE | Source: Other Acute Inpatient Hospital

## 2018-03-12 MED ORDER — HEPARIN (PORCINE) IN NACL 25000-0.45 UT/250ML-% IV SOLN
12.00 | INTRAVENOUS | Status: DC
Start: ? — End: 2018-03-12

## 2018-03-12 MED ORDER — INSULIN LISPRO 100 UNIT/ML ~~LOC~~ SOLN
15.00 | SUBCUTANEOUS | Status: DC
Start: 2018-03-12 — End: 2018-03-12

## 2018-03-12 MED ORDER — VITAMIN D3 125 MCG (5000 UT) PO TABS
5000.00 | ORAL_TABLET | ORAL | Status: DC
Start: 2018-03-13 — End: 2018-03-12

## 2018-03-12 MED ORDER — HEPARIN SODIUM (PORCINE) 1000 UNIT/ML IJ SOLN
2000.00 | INTRAMUSCULAR | Status: DC
Start: ? — End: 2018-03-12

## 2018-03-12 MED ORDER — MORPHINE SULFATE 2 MG/ML IJ SOLN
2.00 | INTRAMUSCULAR | Status: DC
Start: ? — End: 2018-03-12

## 2018-03-12 MED ORDER — DEXTROSE 10 % IV SOLN
12.50 | INTRAVENOUS | Status: DC
Start: ? — End: 2018-03-12

## 2018-03-12 MED ORDER — ALLOPURINOL 100 MG PO TABS
50.00 | ORAL_TABLET | ORAL | Status: DC
Start: 2018-03-13 — End: 2018-03-12

## 2018-03-12 MED ORDER — SEVELAMER CARBONATE 800 MG PO TABS
1600.00 | ORAL_TABLET | ORAL | Status: DC
Start: 2018-03-12 — End: 2018-03-12

## 2018-03-12 MED ORDER — AMLODIPINE BESYLATE 10 MG PO TABS
10.00 | ORAL_TABLET | ORAL | Status: DC
Start: 2018-03-13 — End: 2018-03-12

## 2018-03-12 MED ORDER — INSULIN LISPRO 100 UNIT/ML ~~LOC~~ SOLN
1.00 | SUBCUTANEOUS | Status: DC
Start: 2018-03-12 — End: 2018-03-12

## 2018-03-12 MED ORDER — BUMETANIDE 1 MG PO TABS
2.00 | ORAL_TABLET | ORAL | Status: DC
Start: 2018-03-13 — End: 2018-03-12

## 2018-03-12 MED ORDER — ISOSORBIDE MONONITRATE ER 30 MG PO TB24
30.00 | ORAL_TABLET | ORAL | Status: DC
Start: 2018-03-13 — End: 2018-03-12

## 2018-03-12 MED ORDER — DICLOFENAC SODIUM 1 % TD GEL
2.00 | TRANSDERMAL | Status: DC
Start: ? — End: 2018-03-12

## 2018-03-12 MED ORDER — FLUOXETINE HCL 20 MG PO CAPS
60.00 | ORAL_CAPSULE | ORAL | Status: DC
Start: 2018-03-13 — End: 2018-03-12

## 2018-03-12 MED ORDER — GENERIC EXTERNAL MEDICATION
2.00 | Status: DC
Start: ? — End: 2018-03-12

## 2018-03-12 MED ORDER — ACETAMINOPHEN 325 MG PO TABS
650.00 | ORAL_TABLET | ORAL | Status: DC
Start: ? — End: 2018-03-12

## 2018-03-12 MED ORDER — ASPIRIN 81 MG PO CHEW
81.00 | CHEWABLE_TABLET | ORAL | Status: DC
Start: 2018-03-13 — End: 2018-03-12

## 2018-03-12 MED ORDER — FAMOTIDINE 20 MG PO TABS
20.00 | ORAL_TABLET | ORAL | Status: DC
Start: ? — End: 2018-03-12

## 2018-03-12 MED ORDER — CARVEDILOL 25 MG PO TABS
25.00 | ORAL_TABLET | ORAL | Status: DC
Start: 2018-03-13 — End: 2018-03-12

## 2018-03-12 MED ORDER — INSULIN GLARGINE 100 UNIT/ML ~~LOC~~ SOLN
30.00 | SUBCUTANEOUS | Status: DC
Start: 2018-03-12 — End: 2018-03-12

## 2018-03-12 MED ORDER — CARVEDILOL 25 MG PO TABS
50.00 | ORAL_TABLET | ORAL | Status: DC
Start: 2018-03-12 — End: 2018-03-12

## 2018-03-12 MED ORDER — TRAMADOL HCL 50 MG PO TABS
50.00 | ORAL_TABLET | ORAL | Status: DC
Start: ? — End: 2018-03-12

## 2018-03-12 MED ORDER — GABAPENTIN 300 MG PO CAPS
600.00 | ORAL_CAPSULE | ORAL | Status: DC
Start: 2018-03-12 — End: 2018-03-12

## 2018-03-12 MED ORDER — ATORVASTATIN CALCIUM 80 MG PO TABS
80.00 | ORAL_TABLET | ORAL | Status: DC
Start: 2018-03-13 — End: 2018-03-12

## 2018-03-12 MED ORDER — AMIODARONE HCL 200 MG PO TABS
200.00 | ORAL_TABLET | ORAL | Status: DC
Start: 2018-03-13 — End: 2018-03-12

## 2018-03-12 MED ORDER — NITROGLYCERIN 0.4 MG SL SUBL
0.40 | SUBLINGUAL_TABLET | SUBLINGUAL | Status: DC
Start: ? — End: 2018-03-12

## 2018-03-12 MED ORDER — MELATONIN 3 MG PO TABS
6.00 | ORAL_TABLET | ORAL | Status: DC
Start: ? — End: 2018-03-12

## 2018-03-12 MED ORDER — POLYETHYLENE GLYCOL 3350 17 G PO PACK
17.00 | PACK | ORAL | Status: DC
Start: ? — End: 2018-03-12

## 2018-03-12 MED ORDER — NITROGLYCERIN 2 % TD OINT
1.00 | TOPICAL_OINTMENT | TRANSDERMAL | Status: DC
Start: 2018-03-12 — End: 2018-03-12

## 2018-03-12 MED ORDER — EZETIMIBE 10 MG PO TABS
10.00 | ORAL_TABLET | ORAL | Status: DC
Start: 2018-03-12 — End: 2018-03-12

## 2018-03-12 MED ORDER — TICAGRELOR 90 MG PO TABS
90.00 | ORAL_TABLET | ORAL | Status: DC
Start: 2018-03-12 — End: 2018-03-12

## 2018-03-12 MED ORDER — HYDRALAZINE HCL 25 MG PO TABS
25.00 | ORAL_TABLET | ORAL | Status: DC
Start: 2018-03-12 — End: 2018-03-12

## 2018-03-13 DIAGNOSIS — I214 Non-ST elevation (NSTEMI) myocardial infarction: Principal | ICD-10-CM

## 2018-03-14 MED ORDER — GABAPENTIN 300 MG CAPSULE
ORAL_CAPSULE | Freq: Every evening | ORAL | 0 refills | 0 days | Status: CP
Start: 2018-03-14 — End: 2018-03-18

## 2018-03-14 MED ORDER — RIVAROXABAN 15 MG TABLET: 15 mg | tablet | Freq: Every day | 3 refills | 0 days | Status: AC

## 2018-03-14 MED ORDER — RIVAROXABAN 15 MG TABLET
ORAL_TABLET | Freq: Every day | ORAL | 3 refills | 0.00000 days | Status: CP
Start: 2018-03-14 — End: 2018-03-18
  Filled 2018-03-14: qty 90, 90d supply, fill #0

## 2018-03-14 MED FILL — XARELTO 15 MG TABLET: 90 days supply | Qty: 90 | Fill #0 | Status: AC

## 2018-03-15 MED ORDER — FAMOTIDINE 20 MG TABLET
ORAL_TABLET | Freq: Two times a day (BID) | ORAL | 0 refills | 0.00000 days | Status: CP | PRN
Start: 2018-03-15 — End: 2018-03-15

## 2018-03-15 NOTE — Telephone Encounter (Signed)
This encounter was created in error - please disregard.

## 2018-03-16 MED ORDER — CYCLOBENZAPRINE 10 MG TABLET
ORAL_TABLET | Freq: Three times a day (TID) | ORAL | 0 refills | 0.00000 days | Status: CP
Start: 2018-03-16 — End: 2018-03-25
  Filled 2018-03-18: qty 21, 7d supply, fill #0

## 2018-03-18 ENCOUNTER — Ambulatory Visit: Admit: 2018-03-18 | Discharge: 2018-03-18 | Payer: MEDICARE | Attending: Neurology | Primary: Neurology

## 2018-03-18 ENCOUNTER — Ambulatory Visit: Admit: 2018-03-18 | Discharge: 2018-03-19 | Payer: MEDICARE

## 2018-03-18 ENCOUNTER — Ambulatory Visit
Admit: 2018-03-18 | Discharge: 2018-03-18 | Payer: MEDICARE | Attending: Cardiovascular Disease | Primary: Cardiovascular Disease

## 2018-03-18 DIAGNOSIS — Z7901 Long term (current) use of anticoagulants: Secondary | ICD-10-CM

## 2018-03-18 DIAGNOSIS — I214 Non-ST elevation (NSTEMI) myocardial infarction: Principal | ICD-10-CM

## 2018-03-18 DIAGNOSIS — E1122 Type 2 diabetes mellitus with diabetic chronic kidney disease: Secondary | ICD-10-CM

## 2018-03-18 DIAGNOSIS — N184 Chronic kidney disease, stage 4 (severe): Principal | ICD-10-CM

## 2018-03-18 DIAGNOSIS — G44209 Tension-type headache, unspecified, not intractable: Principal | ICD-10-CM

## 2018-03-18 DIAGNOSIS — M21372 Foot drop, left foot: Secondary | ICD-10-CM

## 2018-03-18 DIAGNOSIS — Z794 Long term (current) use of insulin: Secondary | ICD-10-CM

## 2018-03-18 DIAGNOSIS — I480 Paroxysmal atrial fibrillation: Secondary | ICD-10-CM

## 2018-03-18 DIAGNOSIS — N182 Chronic kidney disease, stage 2 (mild): Secondary | ICD-10-CM

## 2018-03-18 MED ORDER — ALLOPURINOL 100 MG TABLET
ORAL_TABLET | ORAL | 11 refills | 0 days | Status: CP
Start: 2018-03-18 — End: 2018-07-15
  Filled 2018-03-18: qty 22, 88d supply, fill #0

## 2018-03-18 MED ORDER — NITROGLYCERIN 0.4 MG SUBLINGUAL TABLET
SUBLINGUAL | 3 refills | 1 days | Status: CP | PRN
Start: 2018-03-18 — End: 2019-03-18

## 2018-03-18 MED ORDER — ISOSORBIDE MONONITRATE ER 30 MG TABLET,EXTENDED RELEASE 24 HR
ORAL_TABLET | Freq: Every day | ORAL | 3 refills | 0 days | Status: SS
Start: 2018-03-18 — End: 2018-09-22

## 2018-03-18 MED ORDER — QUETIAPINE 50 MG TABLET
ORAL_TABLET | Freq: Two times a day (BID) | ORAL | 3 refills | 90 days | Status: CP | PRN
Start: 2018-03-18 — End: 2018-11-24
  Filled 2018-03-29: qty 180, 90d supply, fill #0

## 2018-03-18 MED ORDER — FLUOXETINE 20 MG CAPSULE
ORAL_CAPSULE | 3 refills | 0 days | Status: CP
Start: 2018-03-18 — End: 2018-05-12

## 2018-03-18 MED ORDER — HYDRALAZINE 25 MG TABLET: 50 mg | tablet | Freq: Three times a day (TID) | 3 refills | 0 days | Status: AC

## 2018-03-18 MED ORDER — BUMETANIDE 2 MG TABLET
ORAL_TABLET | ORAL | 3 refills | 0 days | Status: CP
Start: 2018-03-18 — End: 2018-07-08
  Filled 2018-05-28: qty 90, 90d supply, fill #0

## 2018-03-18 MED ORDER — HYDRALAZINE 50 MG TABLET
ORAL_TABLET | Freq: Three times a day (TID) | ORAL | 3 refills | 0 days | Status: CP
Start: 2018-03-18 — End: 2018-04-02

## 2018-03-18 MED ORDER — HYDRALAZINE 25 MG TABLET
ORAL_TABLET | Freq: Three times a day (TID) | ORAL | 3 refills | 0.00000 days | Status: CP
Start: 2018-03-18 — End: 2018-03-18
  Filled 2018-03-29: qty 90, 30d supply, fill #0

## 2018-03-18 MED ORDER — INSULIN ASPART (U-100) 100 UNIT/ML (3 ML) SUBCUTANEOUS PEN
Freq: Three times a day (TID) | SUBCUTANEOUS | 11 refills | 0.00000 days | Status: CP
Start: 2018-03-18 — End: 2018-09-03

## 2018-03-18 MED ORDER — INSULIN DETEMIR (U-100) 100 UNIT/ML (3 ML) SUBCUTANEOUS PEN
Freq: Two times a day (BID) | SUBCUTANEOUS | 11 refills | 0 days | Status: CP
Start: 2018-03-18 — End: 2018-05-12
  Filled 2018-03-18: qty 15, 19d supply, fill #0

## 2018-03-18 MED ORDER — AMLODIPINE 10 MG TABLET
ORAL_TABLET | Freq: Every day | ORAL | PRN refills | 0 days | Status: SS
Start: 2018-03-18 — End: 2018-05-23

## 2018-03-18 MED ORDER — RIVAROXABAN 15 MG TABLET
ORAL_TABLET | Freq: Every day | ORAL | 3 refills | 0 days | Status: CP
Start: 2018-03-18 — End: 2019-03-18
  Filled 2018-06-11: qty 90, 90d supply, fill #0

## 2018-03-18 MED ORDER — GABAPENTIN 300 MG CAPSULE
ORAL_CAPSULE | Freq: Every evening | ORAL | 3 refills | 0 days | Status: SS
Start: 2018-03-18 — End: 2018-06-03

## 2018-03-18 MED ORDER — CARVEDILOL 25 MG TABLET
ORAL_TABLET | 3 refills | 0 days | Status: SS
Start: 2018-03-18 — End: 2018-10-19

## 2018-03-18 MED ORDER — ATORVASTATIN 80 MG TABLET
ORAL_TABLET | Freq: Every day | ORAL | 3 refills | 0.00000 days | Status: CP
Start: 2018-03-18 — End: 2019-03-18
  Filled 2018-03-18: qty 90, 90d supply, fill #0

## 2018-03-18 MED ORDER — EZETIMIBE 10 MG TABLET
ORAL | 3 refills | 0 days | Status: CP
Start: 2018-03-18 — End: 2019-03-18
  Filled 2018-03-18: qty 90, 90d supply, fill #0

## 2018-03-18 MED ORDER — SEVELAMER CARBONATE 800 MG TABLET
ORAL_TABLET | 3 refills | 0 days | Status: CP
Start: 2018-03-18 — End: 2019-03-18
  Filled 2018-05-28: qty 180, 30d supply, fill #0

## 2018-03-18 MED FILL — CARVEDILOL 25 MG TABLET: 90 days supply | Qty: 270 | Fill #0 | Status: AC

## 2018-03-18 MED FILL — ISOSORBIDE MONONITRATE ER 30 MG TABLET,EXTENDED RELEASE 24 HR: 90 days supply | Qty: 90 | Fill #0 | Status: AC

## 2018-03-18 MED FILL — BRILINTA 90 MG TABLET: ORAL | 90 days supply | Qty: 180 | Fill #1

## 2018-03-18 MED FILL — AMIODARONE 200 MG TABLET: ORAL | 90 days supply | Qty: 90 | Fill #1

## 2018-03-18 MED FILL — EZETIMIBE 10 MG TABLET: 90 days supply | Qty: 90 | Fill #0 | Status: AC

## 2018-03-18 MED FILL — LEVEMIR FLEXTOUCH U-100 INSULIN 100 UNIT/ML (3 ML) SUBCUTANEOUS PEN: 19 days supply | Qty: 15 | Fill #0 | Status: AC

## 2018-03-18 MED FILL — ISOSORBIDE MONONITRATE ER 30 MG TABLET,EXTENDED RELEASE 24 HR: ORAL | 90 days supply | Qty: 90 | Fill #0

## 2018-03-18 MED FILL — ATORVASTATIN 80 MG TABLET: 90 days supply | Qty: 90 | Fill #0 | Status: AC

## 2018-03-18 MED FILL — CYCLOBENZAPRINE 10 MG TABLET: 7 days supply | Qty: 21 | Fill #0 | Status: AC

## 2018-03-18 MED FILL — BRILINTA 90 MG TABLET: 90 days supply | Qty: 180 | Fill #1 | Status: AC

## 2018-03-18 MED FILL — ALLOPURINOL 100 MG TABLET: 88 days supply | Qty: 22 | Fill #0 | Status: AC

## 2018-03-18 MED FILL — AMIODARONE 200 MG TABLET: 90 days supply | Qty: 90 | Fill #1 | Status: AC

## 2018-03-18 MED FILL — CARVEDILOL 25 MG TABLET: 90 days supply | Qty: 270 | Fill #0

## 2018-03-21 MED ORDER — EVOLOCUMAB 140 MG/ML SUBCUTANEOUS PEN INJECTOR
INJECTION | SUBCUTANEOUS | 11 refills | 0.00000 days | Status: CP
Start: 2018-03-21 — End: 2018-04-28

## 2018-03-29 MED FILL — QUETIAPINE 50 MG TABLET: 90 days supply | Qty: 180 | Fill #0 | Status: AC

## 2018-03-29 MED FILL — HYDRALAZINE 50 MG TABLET: 30 days supply | Qty: 90 | Fill #0 | Status: AC

## 2018-03-31 ENCOUNTER — Other Ambulatory Visit: Payer: Self-pay

## 2018-03-31 ENCOUNTER — Emergency Department: Payer: Medicare Other

## 2018-03-31 ENCOUNTER — Emergency Department
Admission: EM | Admit: 2018-03-31 | Discharge: 2018-03-31 | Disposition: A | Discharge status: Home / Self Care | Payer: Medicare Other | Attending: Emergency Medicine | Admitting: Emergency Medicine

## 2018-03-31 ENCOUNTER — Encounter: Payer: Self-pay | Admitting: Emergency Medicine

## 2018-03-31 DIAGNOSIS — R55 Syncope and collapse: Secondary | ICD-10-CM | POA: Insufficient documentation

## 2018-03-31 DIAGNOSIS — Z7982 Long term (current) use of aspirin: Secondary | ICD-10-CM | POA: Diagnosis not present

## 2018-03-31 DIAGNOSIS — R42 Dizziness and giddiness: Secondary | ICD-10-CM | POA: Diagnosis present

## 2018-03-31 DIAGNOSIS — I252 Old myocardial infarction: Secondary | ICD-10-CM | POA: Insufficient documentation

## 2018-03-31 DIAGNOSIS — Z87891 Personal history of nicotine dependence: Secondary | ICD-10-CM | POA: Insufficient documentation

## 2018-03-31 DIAGNOSIS — I2510 Atherosclerotic heart disease of native coronary artery without angina pectoris: Secondary | ICD-10-CM | POA: Diagnosis not present

## 2018-03-31 DIAGNOSIS — I5022 Chronic systolic (congestive) heart failure: Secondary | ICD-10-CM | POA: Insufficient documentation

## 2018-03-31 DIAGNOSIS — Z794 Long term (current) use of insulin: Secondary | ICD-10-CM | POA: Insufficient documentation

## 2018-03-31 DIAGNOSIS — I110 Hypertensive heart disease with heart failure: Secondary | ICD-10-CM | POA: Diagnosis not present

## 2018-03-31 DIAGNOSIS — E119 Type 2 diabetes mellitus without complications: Secondary | ICD-10-CM | POA: Diagnosis not present

## 2018-03-31 DIAGNOSIS — Z79899 Other long term (current) drug therapy: Secondary | ICD-10-CM | POA: Diagnosis not present

## 2018-03-31 LAB — BASIC METABOLIC PANEL
Anion gap: 8 (ref 5–15)
BUN: 23 mg/dL — ABNORMAL HIGH (ref 6–20)
CO2: 22 mmol/L (ref 22–32)
Calcium: 8.6 mg/dL — ABNORMAL LOW (ref 8.9–10.3)
Chloride: 105 mmol/L (ref 98–111)
Creatinine, Ser: 3.02 mg/dL — ABNORMAL HIGH (ref 0.61–1.24)
GFR calc Af Amer: 27 mL/min — ABNORMAL LOW (ref >60)
GFR, EST NON AFRICAN AMERICAN: 23 mL/min — AB (ref >60)
Glucose, Bld: 233 mg/dL — ABNORMAL HIGH (ref 70–99)
Potassium: 5 mmol/L (ref 3.5–5.1)
Sodium: 135 mmol/L (ref 135–145)

## 2018-03-31 LAB — CBC
HCT: 36.7 % — ABNORMAL LOW (ref 39.0–52.0)
Hemoglobin: 11.4 g/dL — ABNORMAL LOW (ref 13.0–17.0)
MCH: 26.6 pg (ref 26.0–34.0)
MCHC: 31.1 g/dL (ref 30.0–36.0)
MCV: 85.5 fL (ref 80.0–100.0)
NRBC: 0 % (ref 0.0–0.2)
Platelets: 238 10*3/uL (ref 150–400)
RBC: 4.29 MIL/uL (ref 4.22–5.81)
RDW: 15.1 % (ref 11.5–15.5)
WBC: 9 10*3/uL (ref 4.0–10.5)

## 2018-03-31 LAB — TROPONIN I
Troponin I: <0.03 ng/mL (ref <0.03)
Troponin I: <0.03 ng/mL (ref <0.03)

## 2018-03-31 MED ORDER — HYDRALAZINE HCL 25 MG PO TABS: 25.0000 mg | ORAL_TABLET | Freq: Three times a day (TID) | ORAL | 0 refills | Status: DC

## 2018-03-31 NOTE — ED Notes (Signed)
Pt given a boxed tray and a cup of water.

## 2018-03-31 NOTE — Discharge Instructions (Addendum)
Follow-up with your doctor next week as scheduled.  Take the 25 mg dose (not 50mg ) of the hydralazine going forward until you follow-up.  A prescription has been provided.  Return to the ER for new, worsening, or persistent severe lightheadedness, excessive sweating, chest pain, shortness of breath, or any other new or worsening symptoms that concern you.

## 2018-03-31 NOTE — ED Notes (Signed)
Pt is resting in bed. Respirations even/unlabored. Visitors at bedside.

## 2018-03-31 NOTE — ED Triage Notes (Addendum)
Pt arrived with concerns over dizziness that started yesterday. EMS reports upon their arrival pt hypotensive (99/57), pale, and diaphoretic. Blood sugar 243. Pt arrives alert and oriented x4 and denies pain. Pt given 500cc of fluid in route.

## 2018-03-31 NOTE — ED Provider Notes (Signed)
[facility] Emergency Department Provider Note ____________________________________________   First MD Initiated Contact with Patient 03/31/18 1823     (approximate)  I have reviewed the triage vital signs and the nursing notes.   HISTORY  Chief Complaint Dizziness    HPI Edwin [name] is a 48 y.o. male with PMH as noted below including CAD status post recent stenting at [facility] who presents with dizziness, described as lightheadedness and fatigue, gradual onset since yesterday, and worsening this evening.  The patient states that he felt lightheaded and like he was going to pass out.  He reports some shortness of breath with it but denies chest pain, nausea or vomiting, or fever.  The patient states that he has been feeling this way ever since he started some additional blood pressure medication when he was discharged from the hospital few days ago.  Per EMS, the patient had a blood pressure of 99/57 on their arrival and appeared diaphoretic and weak.  He received 500 cc of normal saline in the field.   Past Medical History:  Diagnosis Date  . CAD (coronary artery disease)   . Chronic systolic heart failure ([place])   . Diabetes mellitus without complication ([place])   . Heart attack ([place])   . Hyperlipidemia   . Hypertension   . Ischemic cardiomyopathy   . Paroxysmal atrial fibrillation [facility])     Patient Active Problem List   Diagnosis Date Noted  . NSTEMI (non-ST elevated myocardial infarction) ([place]) 03/08/2018  . 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 CARDIAC SURGERY    . IRRIGATION AND DEBRIDEMENT KNEE Left 12/17/2017   Procedure: LEFT KNEE DEBRIDEMENT AND SYNOVECTOMY;  Surgeon: Thornton Park, MD;  Location: [facility] ORS;  Service: Orthopedics;  Laterality: Left;  . JOINT REPLACEMENT    . LEFT HEART CATH AND CORONARY ANGIOGRAPHY Left 03/09/2018   Procedure: LEFT HEART CATH AND CORONARY  ANGIOGRAPHY;  Surgeon: Nelva Bush, MD;  Location: [place] CV LAB;  Service: Cardiovascular;  Laterality: Left;  . PERCUTANEOUS CORONARY STENT INTERVENTION (PCI-S)      Prior to Admission medications   Medication Sig Start Date End Date Taking? Authorizing Provider  allopurinol (ZYLOPRIM) 100 MG tablet Take 0.5 tablets (50 mg total) by mouth every other day. 12/21/17 03/08/18  Henreitta Leber, MD  amiodarone (PACERONE) 200 MG tablet Take 200 mg by mouth daily. 09/14/17   [provider]  amLODipine (NORVASC) 10 MG tablet Take 10 mg by mouth daily. 10/16/17   [provider]  aspirin EC 81 MG EC tablet Take 1 tablet (81 mg total) by mouth daily. 03/10/18   Fritzi Mandes, MD  atorvastatin (LIPITOR) 80 MG tablet Take 80 mg by mouth every evening.  09/14/17   [provider]  BRILINTA 90 MG TABS tablet Take 90 mg by mouth 2 (two) times daily. 09/14/17   [provider]  bumetanide (BUMEX) 2 MG tablet Take 2 mg by mouth daily. 09/14/17   [provider]  carvedilol (COREG) 25 MG tablet Take 25-50 mg by mouth See admin instructions. Take 1 tablet (25MG ) by mouth every morning and 2 tablets (50MG ) by mouth every night 10/16/17   [provider]  cholecalciferol (VITAMIN D) 1000 units tablet Take 5,000 Units by mouth daily.    [provider]  cyclobenzaprine (FLEXERIL) 10 MG tablet Take 10 mg by mouth 3 (three) times daily as needed for muscle pain. 03/01/18  [provider]  ezetimibe (ZETIA) 10 MG tablet Take 10 mg by mouth daily. 10/14/17   [provider]  famotidine (PEPCID) 20 MG tablet Take 20 mg by mouth daily. 09/14/17   [provider]  FLUoxetine (PROZAC) 20 MG capsule Take 60 mg by mouth at bedtime.  12/04/17   [provider]  heparin 25000-0.45 UT/250ML-% infusion Inject 2,000 Units/hr into the vein continuous. 03/10/18   Fritzi Mandes, MD  hydrALAZINE (APRESOLINE) 25 MG tablet Take 1 tablet (25  mg total) by mouth 3 (three) times daily for 15 days. 03/31/18 04/15/18  Arta Silence, MD  insulin aspart (NOVOLOG) 100 UNIT/ML injection Inject 15 Units into the skin 3 (three) times daily with meals. 12/20/17   Henreitta Leber, MD  insulin detemir (LEVEMIR) 100 UNIT/ML injection Inject 0.4 mLs (40 Units total) into the skin 2 (two) times daily. 12/20/17   Henreitta Leber, MD  isosorbide mononitrate (IMDUR) 30 MG 24 hr tablet Take 1 tablet (30 mg total) by mouth daily. 03/12/18   Epifanio Lesches, MD  nitroGLYCERIN (NITROSTAT) 0.4 MG SL tablet Place 0.4 mg under the tongue every 5 (five) minutes as needed for chest pain.    [provider]  RENVELA 800 MG tablet Take 1,600 mg by mouth 3 (three) times daily with meals. 10/14/17   [provider]  traMADol (ULTRAM) 50 MG tablet Take 1 tablet (50 mg total) by mouth every 6 (six) hours as needed. 12/20/17   Henreitta Leber, MD  VOLTAREN 1 % GEL Apply 2 g topically 3 (three) times daily. 12/04/17   [provider]    Allergies Morphine and related  Family History  Problem Relation Age of Onset  . Diabetes Mother   . Peripheral vascular disease Mother   . COPD Father     Social History Social History   Tobacco Use  . 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 Smokeless tobacco: Current User    Types: Snuff  Substance Use Topics  . Alcohol use: Never    Frequency: Never  . Drug use: Never    Review of Systems  Constitutional: No fever/chills. Eyes: No redness. ENT: No neck pain. Cardiovascular: Denies chest pain. Respiratory: Positive for resolved shortness of breath. Gastrointestinal: No vomiting or diarrhea.  Genitourinary: Negative for flank pain.  Musculoskeletal: Negative for back pain. Skin: Negative for rash. Neurological: Negative for headache.   ____________________________________________   PHYSICAL EXAM:  VITAL SIGNS: ED Triage Vitals  Enc Vitals Group     BP 03/31/18 1815 (!) 143/82       Pulse Rate 03/31/18 1814 70     Resp 03/31/18 1814 14     Temp 03/31/18 1814 98.1 F (36.7 C)     Temp Source 03/31/18 1814 Oral     SpO2 03/31/18 1814 100 %     Weight 03/31/18 1815 (!) 335 lb 1.6 oz (152 kg)     Height 03/31/18 1815 6\' 3"  (1.905 m)     Head Circumference --      Peak Flow --      Pain Score 03/31/18 1815 0     Pain Loc --      Pain Edu? --      Excl. in [place]? --     Constitutional: Alert and oriented.  Chronically ill-appearing but in no acute distress.   Eyes: Conjunctivae are normal.  Head: Atraumatic. Nose: No congestion/rhinnorhea. Mouth/Throat: Mucous membranes are somewhat dry.   Neck: Normal range of motion.  Cardiovascular: Normal rate, regular rhythm. Grossly normal heart sounds.  Good peripheral circulation. Respiratory: Normal respiratory effort.  No retractions. Lungs CTAB. Gastrointestinal: Soft and nontender. No distention.  Genitourinary: No flank tenderness. Musculoskeletal: No lower extremity edema.  Extremities warm and well perfused.  Neurologic:  Normal speech and language. No gross focal neurologic deficits are appreciated.  Skin:  Skin is warm and dry. No rash noted. Psychiatric: Mood and affect are normal. Speech and behavior are normal.  ____________________________________________   LABS (all labs ordered are listed, but only abnormal results are displayed)  Labs Reviewed  BASIC METABOLIC PANEL - Abnormal; Notable for the following components:      Result Value   Glucose, Bld 233 (*)    BUN 23 (*)    Creatinine, Ser 3.02 (*)    Calcium 8.6 (*)    GFR calc non Af Amer 23 (*)    GFR calc Af Amer 27 (*)    All other components within normal limits  CBC - Abnormal; Notable for the following components:   Hemoglobin 11.4 (*)    HCT 36.7 (*)    All other components within normal limits  TROPONIN I  TROPONIN I  URINALYSIS, COMPLETE (UACMP) WITH MICROSCOPIC   ____________________________________________  EKG  ED ECG  REPORT I, Arta Silence, the attending physician, personally viewed and interpreted this ECG.  Date: 03/31/2018 EKG Time: 1815 Rate: 70 Rhythm: Atrial paced rhythm QRS Axis: normal Intervals: normal ST/T Wave abnormalities: normal Narrative Interpretation: no evidence of acute ischemia  ____________________________________________  RADIOLOGY  CXR: No focal infiltrate or other acute abnormality  ____________________________________________   PROCEDURES  Procedure(s) performed: No  Procedures  Critical Care performed: No ____________________________________________   INITIAL IMPRESSION / ASSESSMENT AND PLAN / ED COURSE  Pertinent labs & imaging results that were available during my care of the patient were reviewed by me and considered in my medical decision making (see chart for details).  48 year old male with PMH as noted above including CAD status post recent stenting presents with dizziness over the last 2 days, worsening this evening and associated with apparent near syncope.  The patient reports feeling lightheaded and mildly short of breath but denies other acute symptoms.  On EMS arrival he had a somewhat low blood pressure and was diaphoretic appearing.  He received 500 cc of normal saline en route.  The patient states he feels fine now.  On exam he is somewhat chronically ill and weak appearing but in no acute distress.  His lungs are clear.  Mucous membranes are slightly dry.  The remainder of the exam is relatively unremarkable.  EKG is nonischemic.  Overall, the presentation is most consistent with near syncope.  Given the hypotension and the fact that the patient has been having the symptoms after being recently started on additional blood pressure medication, I think that this is the most likely culprit.  I have a very low suspicion for ACS.  However, given his recent cardiac catheterization and CAD history, ACS is still on the differential.  We will obtain  troponins x2, and I will contact cardiology at [facility] to discuss the patient's medications.  ----------------------------------------- 8:43 PM on 03/31/2018 -----------------------------------------  Based on further discussion with the patient and his wife it appears that he was started on Imdur during the last hospital visit, and his hydralazine was increased from 25 mg to 50 mg 3 times daily.  I consulted Dr. Vilma Meckel from cardiology at [facility].  She reviewed the patient's records over  the phone and discussed his case with me.  Based on the description of his symptoms and the recent medication changes, she advised that I have the patient maintain his Imdur but go back to the 25 mg dose of hydralazine.  At this time, the patient's blood pressure remained stable and he is asymptomatic.  He would like to go home if at all possible.  I will repeat a troponin after 3 hours to rule out ACS.  If the patient remains asymptomatic and this is negative, he will be discharged home.  ----------------------------------------- 10:44 PM on 03/31/2018 -----------------------------------------  The patient has remained asymptomatic in the ED.  His vital signs including the blood pressure have remained stable and he has had no recurrent hypotension.  Repeat troponin is negative.  Of note, the monitor displayed a few events that triggered as brief runs of V. tach.  I reviewed each of them on the telemetry readout and all were consistent with artifact, with normal sinus beats appearing throughout.  None were true V. Tach.  At this time the patient is stable for discharge home.  He feels comfortable and wants to go home.  Return precautions given, and he and his wife expressed understanding.  He will follow-up with his doctor in 5 days as scheduled.  I provided a prescription for the lower dose of hydralazine as per my discussion with [facility] cardiology. ____________________________________________   FINAL CLINICAL  IMPRESSION(S) / ED DIAGNOSES  Final diagnoses:  Near syncope      NEW MEDICATIONS STARTED DURING THIS VISIT:  New Prescriptions   HYDRALAZINE (APRESOLINE) 25 MG TABLET    Take 1 tablet (25 mg total) by mouth 3 (three) times daily for 15 days.     Note:  This document was prepared using Dragon voice recognition software and [place] include unintentional dictation errors.    Arta Silence, MD 03/31/18 2246

## 2018-04-02 ENCOUNTER — Ambulatory Visit: Admit: 2018-04-02 | Discharge: 2018-04-03 | Payer: MEDICARE

## 2018-04-02 DIAGNOSIS — E1142 Type 2 diabetes mellitus with diabetic polyneuropathy: Secondary | ICD-10-CM

## 2018-04-02 DIAGNOSIS — I480 Paroxysmal atrial fibrillation: Secondary | ICD-10-CM

## 2018-04-02 DIAGNOSIS — N184 Chronic kidney disease, stage 4 (severe): Secondary | ICD-10-CM

## 2018-04-02 DIAGNOSIS — I10 Essential (primary) hypertension: Principal | ICD-10-CM

## 2018-04-02 DIAGNOSIS — I2510 Atherosclerotic heart disease of native coronary artery without angina pectoris: Secondary | ICD-10-CM

## 2018-04-02 MED ORDER — HYDRALAZINE 25 MG TABLET
ORAL_TABLET | Freq: Three times a day (TID) | ORAL | 3 refills | 0 days
Start: 2018-04-02 — End: 2018-04-28

## 2018-04-12 ENCOUNTER — Ambulatory Visit: Admit: 2018-04-12 | Discharge: 2018-04-13 | Payer: MEDICARE

## 2018-04-12 DIAGNOSIS — E1122 Type 2 diabetes mellitus with diabetic chronic kidney disease: Secondary | ICD-10-CM

## 2018-04-12 DIAGNOSIS — Z794 Long term (current) use of insulin: Secondary | ICD-10-CM

## 2018-04-12 DIAGNOSIS — E113393 Type 2 diabetes mellitus with moderate nonproliferative diabetic retinopathy without macular edema, bilateral: Principal | ICD-10-CM

## 2018-04-12 DIAGNOSIS — N182 Chronic kidney disease, stage 2 (mild): Secondary | ICD-10-CM

## 2018-04-16 ENCOUNTER — Ambulatory Visit: Admit: 2018-04-16 | Discharge: 2018-04-17 | Payer: MEDICARE | Attending: Clinical | Primary: Clinical

## 2018-04-16 DIAGNOSIS — F4320 Adjustment disorder, unspecified: Principal | ICD-10-CM

## 2018-04-23 ENCOUNTER — Ambulatory Visit: Admit: 2018-04-23 | Discharge: 2018-04-23 | Payer: MEDICARE

## 2018-04-23 DIAGNOSIS — R809 Proteinuria, unspecified: Secondary | ICD-10-CM

## 2018-04-23 DIAGNOSIS — I2510 Atherosclerotic heart disease of native coronary artery without angina pectoris: Secondary | ICD-10-CM

## 2018-04-23 DIAGNOSIS — N184 Chronic kidney disease, stage 4 (severe): Secondary | ICD-10-CM

## 2018-04-23 DIAGNOSIS — E1129 Type 2 diabetes mellitus with other diabetic kidney complication: Secondary | ICD-10-CM

## 2018-04-23 DIAGNOSIS — I5042 Chronic combined systolic (congestive) and diastolic (congestive) heart failure: Secondary | ICD-10-CM

## 2018-04-23 DIAGNOSIS — I10 Essential (primary) hypertension: Principal | ICD-10-CM

## 2018-04-23 DIAGNOSIS — E1122 Type 2 diabetes mellitus with diabetic chronic kidney disease: Secondary | ICD-10-CM

## 2018-04-23 DIAGNOSIS — I480 Paroxysmal atrial fibrillation: Secondary | ICD-10-CM

## 2018-04-23 DIAGNOSIS — E1321 Other specified diabetes mellitus with diabetic nephropathy: Secondary | ICD-10-CM

## 2018-04-23 DIAGNOSIS — Z794 Long term (current) use of insulin: Secondary | ICD-10-CM

## 2018-04-23 DIAGNOSIS — F3342 Major depressive disorder, recurrent, in full remission: Secondary | ICD-10-CM

## 2018-04-23 MED ORDER — LOSARTAN 25 MG TABLET
ORAL_TABLET | Freq: Every day | ORAL | 3 refills | 0.00000 days
Start: 2018-04-23 — End: 2018-04-26

## 2018-04-23 MED ORDER — SEMAGLUTIDE 0.25 MG OR 0.5 MG (2 MG/1.5 ML) SUBCUTANEOUS PEN INJECTOR
SUBCUTANEOUS | 0 refills | 0 days | Status: CP
Start: 2018-04-23 — End: 2018-07-15
  Filled 2018-04-30: qty 1.5, 56d supply, fill #0

## 2018-04-23 MED FILL — NOVOLOG FLEXPEN U-100 INSULIN ASPART 100 UNIT/ML (3 ML) SUBCUTANEOUS: SUBCUTANEOUS | 34 days supply | Qty: 15 | Fill #0

## 2018-04-23 MED FILL — UNIFINE PENTIPS 31 GAUGE X 5/16" NEEDLE (8 MM): 60 days supply | Qty: 300 | Fill #2

## 2018-04-23 MED FILL — LEVEMIR FLEXTOUCH U-100 INSULIN 100 UNIT/ML (3 ML) SUBCUTANEOUS PEN: SUBCUTANEOUS | 19 days supply | Qty: 15 | Fill #1

## 2018-04-23 MED FILL — NOVOLOG FLEXPEN U-100 INSULIN ASPART 100 UNIT/ML (3 ML) SUBCUTANEOUS: 34 days supply | Qty: 15 | Fill #0 | Status: AC

## 2018-04-23 MED FILL — UNIFINE PENTIPS 31 GAUGE X 5/16" NEEDLE: 60 days supply | Qty: 300 | Fill #2 | Status: AC

## 2018-04-23 MED FILL — LEVEMIR FLEXTOUCH U-100 INSULIN 100 UNIT/ML (3 ML) SUBCUTANEOUS PEN: 19 days supply | Qty: 15 | Fill #1 | Status: AC

## 2018-04-23 NOTE — Unmapped (Signed)
[facility] - [facility]  Established Visit [facility] follow-up    Reason for Visit:  Follow-up for f/u CKD, HTN, diabetes    1. Essential hypertension (RAF-[name])    2. Paroxysmal atrial fibrillation (CMS-[name])    3. Type 2 diabetes mellitus with stage 4 chronic kidney disease, with long-term current use of insulin (CMS-[name])    4. CKD (chronic kidney disease) stage 4, GFR 15-29 ml/min (CMS-[name])    5. Recurrent major depressive disorder, in full remission (CMS-[name])    6. Obesity, Class III, BMI 40-49.9 (morbid obesity) (CMS-[name])    7. 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 Chronic heart failure with reduced ejection fraction and diastolic dysfunction (CMS-[name])    9. Coronary artery disease involving native coronary artery of native heart without angina pectoris    10. 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 male with PMHx of CAD s/p multiple stents (most recently 03/12/2018), HTN, T2DM on insulin, paroxysmal AFib, anxiety, depression and others listed below who presents for follow-up for chronic medical conditions     Diagnoses and all orders for this visit:    Essential hypertension (RAF-[name])  BP significantly elevated in clinic today in setting of taking none of his medications in fear of hypotension.  Discussed appropriate decrease in hydralazine as instructed at ED visit at [facility], as this medication had been recently increased as last cardiology clinic visit and all others remained unchanged. Reviewed all of his meds   - continue amlodipine 10mg  daily   - continue carvedilol 25 mg BID   - continue Bumex 2mg  daily  -     hydrALAZINE (APRESOLINE) 25 MG tablet; Take 1 tablet (25 mg total) by mouth Three (3) times a day. (recent ED visit for hypotension with increased dose to 50mg  TID)  - will restart losartan 50mg  daily if Cr back to baseline      Paroxysmal atrial fibrillation (CMS-[name])  Currently on carvedilol for rate control   At most recent hospitalization started on Xarelto given subtherapeutic INR 2/2 noncompliance while on warfarin.  However, given his renal function with GFR < 30, I am concerned if he were to stay on Xarelto and warfarin would actually be agent of choice. Given renal function slightly improved upon recheck at [facility], will recheck renal function at next visit.    - check trough levels at next visit given his BMI and renal function    Diabetic polyneuropathy associated with type 2 diabetes mellitus (CMS-[name])  - asked him to trial off gabapentin 600mg  daily to see if this improves his somnolence, however if unchanged when stopped, can restart  - discussed inability to increase dose given his CKD    CKD (chronic kidney disease) stage 4, GFR 15-29 ml/min (CMS-[name])  At last hospitalization at [facility], last Cr elevated to 3.69 when previously 2.8-3.0.  At most recent ED visit at [facility] was improved.   - recheck renal function today  - if back to baseline, will restart losartan  - f/u with nephrology. Encouraged him to even schedule at [facility] for ease of transportation and access  - stressed importance of this as his CKD will worsen in the setting of uncontrolled HTN and diabetes.   - continue to work on adherence to meds for HTN and diabetes      Coronary artery disease involving native coronary artery of native  heart without angina pectoris  - recent hospitalization for NSTEMI with repeat PCI.   - we spent significant time discussing that to prevention further heart disease, management of his HTN and diabetes is vital and that his latest NSTEMI was directly related to this  - he reports ongoing compliance  - we again discuss pill packs to help improve compliance with oral medications.   - HTN control as noted above  - close f/u with enhanced care [facility]) and myself for ongoing management, however, much of this is reliant on his adherence.      Proteinuria related to Type 2 diabetes  - UA and microalbumin/Cr ratio today  - f/u with nephrology. Encouraged him to even schedule at [facility] for ease of transportation and access    Increased urination:  Discussed that no signs of infection but significant glucosuria and proteinuria which can lead to increased urination.    Trace edema so is not volume overloaded at this time  - again stressed control of HTN and T2DM    Daytime somnolence:  Not compliant with CPAP. DIscussed potential evaluation with sleep clinic, however he and wife adamant that related to medications.  They endorse that he had no issues when on this exact regimen at last hospitalization at [facility]  Discussed that I do not think they are related to his meds and encouraged CPAP use given effects of untreated OSA on CV health.   - continue to encourage CPAP use    I have reviewed and addressed the patient???s adherence and response to prescribed medications. I have identified patient barriers to following the proposed medication and treatment plan, and have noted opportunities to optimize healthy behaviors. I have answered the patient???s questions to satisfaction and the patient voices understanding.      I spent a total of 40 minutes ([id number]) minutes face-to-face with the patient, of which greater than 50% was spent counseling the patient regarding importance of medication adherence, importance of control of diabetes and HTN on heart health, limitation in use of some medications, including DOACs, given his CKD, stage IV.          __________________________________________________________    HPI:    Missed last 2 visits at [facility].     Urinary issues:  Incontinence and bed wetting reported by wife to CCM nurse.       Diabetes: states he is taking all of his medications now.  No issues with them    HTN:  Concerned now about HTN.  States that he is not sure why he is no longer taking losartan.  Still taking hydralzine 25mg  BID.     Somnolence.  Reports that he is tired all day. Can fall asleep at anytime during the day.          ROS:  A 12 point review of systems was negative except for pertinent items noted in the HPI.    Patient Active Problem List   Diagnosis   ??? CAD (coronary artery disease) (RAF-[name])   ??? Idiopathic chronic gout of left knee   ??? Hypercholesteremia   ??? Essential hypertension (RAF-[name])   ??? 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 Osteoarthritis   ??? Chronic nonalcoholic liver disease   ??? GERD (gastroesophageal reflux disease)   ??? Mild intermittent asthma without complication   ??? Chronic pain syndrome   ??? Paroxysmal atrial fibrillation (CMS-[name])   ??? OSA (obstructive  sleep apnea)   ??? Tobacco use disorder   ??? Status post cardiac pacemaker procedure   ??? Type 2 diabetes mellitus with stage 4 chronic kidney disease, with long-term current use of insulin (CMS-[name])   ??? Cardiac pacemaker in situ   ??? Recurrent major depressive disorder, in full remission (CMS-[name])   ??? Adjustment disorder, unspecified   ??? Chronic heart failure with reduced ejection fraction and diastolic dysfunction (CMS-[name])   ??? Hyperkalemia   ??? CKD (chronic kidney disease) stage 4, GFR 15-29 ml/min (CMS-[name])   ??? Acquired left foot drop   ??? Decreased sensation of lower extremity, left   ??? Chronic anticoagulation       Medications:  Reviewed in EPIC      Physical Exam:   Vital Signs:   Pulse 89  - Temp 36.7 ??C (98 ??F) (Oral)  - Resp 18  - Ht 190.5 cm (6' 3)  - Wt (!) 161.7 kg (356 lb 6.4 oz)  - SpO2 98%  - BMI 44.55 kg/m??   Gen: chronically ill appearing, obese, NAD  CV: RRR, no murmurs  Pulm: CTA bilaterally, no crackles or wheezes  GI: Soft, NTND, normal BS. No HSM.  MSK: No edema.  +left foot drop

## 2018-04-26 MED ORDER — LOSARTAN 25 MG TABLET
ORAL_TABLET | Freq: Every day | ORAL | 3 refills | 0.00000 days | Status: CP
Start: 2018-04-26 — End: 2018-04-28

## 2018-04-27 ENCOUNTER — Institutional Professional Consult (permissible substitution): Admit: 2018-04-27 | Discharge: 2018-04-28 | Payer: MEDICARE

## 2018-04-27 DIAGNOSIS — I5042 Chronic combined systolic (congestive) and diastolic (congestive) heart failure: Secondary | ICD-10-CM

## 2018-04-27 DIAGNOSIS — Z4502 Encounter for adjustment and management of automatic implantable cardiac defibrillator: Principal | ICD-10-CM

## 2018-04-28 MED ORDER — LOSARTAN 50 MG TABLET
ORAL_TABLET | Freq: Every day | ORAL | 3 refills | 0 days | Status: CP
Start: 2018-04-28 — End: 2018-06-04
  Filled 2018-04-30: qty 90, 90d supply, fill #0

## 2018-04-28 MED ORDER — HYDRALAZINE 25 MG TABLET
ORAL_TABLET | Freq: Three times a day (TID) | ORAL | 3 refills | 0 days | Status: CP
Start: 2018-04-28 — End: 2018-06-04
  Filled 2018-04-30: qty 270, 90d supply, fill #0

## 2018-04-28 MED ORDER — REPATHA SURECLICK 140 MG/ML SUBCUTANEOUS PEN INJECTOR
SUBCUTANEOUS | 11 refills | 0 days | Status: CP
Start: 2018-04-28 — End: 2018-04-30

## 2018-04-28 NOTE — Unmapped (Signed)
Patient's wife called to inquire about the refill of Jeremy Oliver's Evolocumab. It was noted in the chart that this was refilled nby the pharmacist Tacy Dura this morning and sent to [facility]. A call was placed to the patient's wife notifying her of the action taken and she was satisfied; her only concern is the medication should have been refilled at the beginning of the month. I advised her if the medication is not in her presence by the end of the week she is to call shared services and inquire. Patient verbalized comprehension of information given

## 2018-04-28 NOTE — Unmapped (Signed)
Per test claim for REPATHA SURECLICK 140 G/ML PEN at the [facility], patient needs Medication Assistance Program for Prior Authorization.

## 2018-04-28 NOTE — Unmapped (Signed)
CCM Follow-up Call      PCP Action:sign pended orders     CCM Action:f/u meds. pt DOES have kidney appt 05/11/18 and wife promises he will make appt!    Patient Action:pick up medications asap. calling pharmacy re: if any PAs are needed. following up w/ cardiology office.       Spoke to wife [name].    Pt has not picked up Losartan rx, nor ozempic rx     nor evolucumab rx (ordered from Dr. Rossi/cardiology office)    Rxs are being picked up at [facility] outpatient [facility].    CCM will send to [facility] outpatient pharmacy.    [name] is following up on evolucumab (lowers LDL) today, states Aloi need a PA.    Ozempic is ready for pick up per [name].    Encouraged pt to start medications asap.    [name] has been stalling on picking up b/c wants all meds ready at the same time.    Pt denies cancelling pill packs however stated at last [place] appt did cancel pill packs recently organized by CCM.      Will discuss w/ Provider.

## 2018-04-28 NOTE — Unmapped (Signed)
[name] called to say the medicine Dr. Andrey Farmer ordered for Mr. Branch should be here by the end of the week

## 2018-04-29 ENCOUNTER — Encounter: Payer: Self-pay | Admitting: Emergency Medicine

## 2018-04-29 ENCOUNTER — Other Ambulatory Visit: Payer: Self-pay

## 2018-04-29 ENCOUNTER — Emergency Department
Admission: EM | Admit: 2018-04-29 | Discharge: 2018-04-29 | Disposition: A | Discharge status: Home / Self Care | Payer: Medicare Other | Attending: Emergency Medicine | Admitting: Emergency Medicine

## 2018-04-29 ENCOUNTER — Emergency Department: Payer: Medicare Other

## 2018-04-29 DIAGNOSIS — R059 Cough, unspecified: Secondary | ICD-10-CM

## 2018-04-29 DIAGNOSIS — F17220 Nicotine dependence, chewing tobacco, uncomplicated: Secondary | ICD-10-CM | POA: Insufficient documentation

## 2018-04-29 DIAGNOSIS — E119 Type 2 diabetes mellitus without complications: Secondary | ICD-10-CM | POA: Insufficient documentation

## 2018-04-29 DIAGNOSIS — I5022 Chronic systolic (congestive) heart failure: Secondary | ICD-10-CM | POA: Insufficient documentation

## 2018-04-29 DIAGNOSIS — R05 Cough: Secondary | ICD-10-CM | POA: Diagnosis not present

## 2018-04-29 DIAGNOSIS — Z79899 Other long term (current) drug therapy: Secondary | ICD-10-CM | POA: Diagnosis not present

## 2018-04-29 DIAGNOSIS — I2510 Atherosclerotic heart disease of native coronary artery without angina pectoris: Secondary | ICD-10-CM | POA: Diagnosis not present

## 2018-04-29 DIAGNOSIS — I480 Paroxysmal atrial fibrillation: Secondary | ICD-10-CM | POA: Insufficient documentation

## 2018-04-29 DIAGNOSIS — Z794 Long term (current) use of insulin: Secondary | ICD-10-CM | POA: Insufficient documentation

## 2018-04-29 DIAGNOSIS — R0789 Other chest pain: Secondary | ICD-10-CM

## 2018-04-29 DIAGNOSIS — Z7982 Long term (current) use of aspirin: Secondary | ICD-10-CM | POA: Diagnosis not present

## 2018-04-29 DIAGNOSIS — I110 Hypertensive heart disease with heart failure: Secondary | ICD-10-CM | POA: Diagnosis not present

## 2018-04-29 LAB — CBC WITH DIFFERENTIAL/PLATELET
Abs Immature Granulocytes: 0.05 10*3/uL (ref 0.00–0.07)
BASOS PCT: 1 %
Basophils Absolute: 0.1 10*3/uL (ref 0.0–0.1)
EOS PCT: 2 %
Eosinophils Absolute: 0.3 10*3/uL (ref 0.0–0.5)
HCT: 38.5 % — ABNORMAL LOW (ref 39.0–52.0)
Hemoglobin: 12.3 g/dL — ABNORMAL LOW (ref 13.0–17.0)
Immature Granulocytes: 1 %
Lymphocytes Relative: 18 %
Lymphs Abs: 2 10*3/uL (ref 0.7–4.0)
MCH: 27.5 pg (ref 26.0–34.0)
MCHC: 31.9 g/dL (ref 30.0–36.0)
MCV: 86.1 fL (ref 80.0–100.0)
Monocytes Absolute: 0.7 10*3/uL (ref 0.1–1.0)
Monocytes Relative: 7 %
Neutro Abs: 8 10*3/uL — ABNORMAL HIGH (ref 1.7–7.7)
Neutrophils Relative %: 71 %
PLATELETS: 258 10*3/uL (ref 150–400)
RBC: 4.47 MIL/uL (ref 4.22–5.81)
RDW: 14.6 % (ref 11.5–15.5)
WBC: 11.1 10*3/uL — ABNORMAL HIGH (ref 4.0–10.5)
nRBC: 0 % (ref 0.0–0.2)

## 2018-04-29 LAB — COMPREHENSIVE METABOLIC PANEL
ALT: 12 U/L (ref 0–44)
AST: 17 U/L (ref 15–41)
Albumin: 3.2 g/dL — ABNORMAL LOW (ref 3.5–5.0)
Alkaline Phosphatase: 91 U/L (ref 38–126)
Anion gap: 8 (ref 5–15)
BUN: 28 mg/dL — ABNORMAL HIGH (ref 6–20)
CALCIUM: 8.7 mg/dL — AB (ref 8.9–10.3)
CO2: 24 mmol/L (ref 22–32)
Chloride: 103 mmol/L (ref 98–111)
Creatinine, Ser: 3.07 mg/dL — ABNORMAL HIGH (ref 0.61–1.24)
GFR calc Af Amer: 26 mL/min — ABNORMAL LOW (ref >60)
GFR, EST NON AFRICAN AMERICAN: 23 mL/min — AB (ref >60)
Glucose, Bld: 308 mg/dL — ABNORMAL HIGH (ref 70–99)
Potassium: 4.2 mmol/L (ref 3.5–5.1)
Sodium: 135 mmol/L (ref 135–145)
Total Bilirubin: 0.4 mg/dL (ref 0.3–1.2)
Total Protein: 7.1 g/dL (ref 6.5–8.1)

## 2018-04-29 LAB — TROPONIN I: Troponin I: <0.03 ng/mL (ref <0.03)

## 2018-04-29 LAB — BRAIN NATRIURETIC PEPTIDE: B Natriuretic Peptide: 87 pg/mL (ref 0.0–100.0)

## 2018-04-29 LAB — INFLUENZA PANEL BY PCR (TYPE A & B)
Influenza A By PCR: NEGATIVE
Influenza B By PCR: NEGATIVE

## 2018-04-29 LAB — PROTIME-INR
INR: 0.89
Prothrombin Time: 12 seconds (ref 11.4–15.2)

## 2018-04-29 MED ORDER — ALBUTEROL SULFATE HFA 90 MCG/ACTUATION AEROSOL INHALER
RESPIRATORY_TRACT | 2 refills | 0 days
Start: 2018-04-29 — End: 2018-07-08

## 2018-04-29 MED ORDER — ALBUTEROL SULFATE HFA 108 (90 BASE) MCG/ACT IN AERS: 2.0000 | INHALATION_SPRAY | Freq: Four times a day (QID) | RESPIRATORY_TRACT | 2 refills | Status: AC | PRN

## 2018-04-29 MED ORDER — IPRATROPIUM-ALBUTEROL 0.5-2.5 (3) MG/3ML IN SOLN
3.0000 mL | Freq: Once | RESPIRATORY_TRACT | Status: AC
  Administered 2018-04-29: 3 mL via RESPIRATORY_TRACT
  Filled 2018-04-29: qty 3

## 2018-04-29 NOTE — ED Provider Notes (Addendum)
[facility] Emergency Department Provider Note  ____________________________________________   I have reviewed the triage vital signs and the nursing notes. Where available I have reviewed prior notes and, if possible and indicated, outside hospital notes.    HISTORY  Chief Complaint Chest Pain    HPI Edwin [name] is a 48 y.o. male  multiple medical including CAD, CHF, diabetes mellitus, MIs in the past hypertension, ischemic cardiomyopathy, paroxysmal A. fib, morbid obesity, deconditioning, who was seen here about a month ago after he felt lightheaded, that was thought to be a medication issue when he went home felt better.  Over the last couple days however has had runny nose, sore throat and cough.  Has been coughing "a lot" and that is what brought him in he did feel little lightheaded while coughing.  At this time he states that this is coughing and feels "okay".  He has been drinking foods but eating less.  Positive sick contacts in the home.  There is a flu endemic at this time.  He states he does have chest pain but only when he coughs.   Past Medical History:  Diagnosis Date  . CAD (coronary artery disease)   . Chronic systolic heart failure ([place])   . Diabetes mellitus without complication ([place])   . Heart attack ([place])   . Hyperlipidemia   . Hypertension   . Ischemic cardiomyopathy   . Paroxysmal atrial fibrillation [facility])     Patient Active Problem List   Diagnosis Date Noted  . NSTEMI (non-ST elevated myocardial infarction) ([place]) 03/08/2018  . Infection of left knee ([place]) 12/16/2017    Past Surgical History:  Procedure Laterality Date  . CARDIAC SURGERY    . IRRIGATION AND DEBRIDEMENT KNEE Left 12/17/2017   Procedure: LEFT KNEE DEBRIDEMENT AND SYNOVECTOMY;  Surgeon: Thornton Park, MD;  Location: [facility] ORS;  Service: Orthopedics;  Laterality: Left;  . JOINT REPLACEMENT    . LEFT HEART CATH AND CORONARY ANGIOGRAPHY Left 03/09/2018    Procedure: LEFT HEART CATH AND CORONARY ANGIOGRAPHY;  Surgeon: Nelva Bush, MD;  Location: [facility];  Service: Cardiovascular;  Laterality: Left;  . PERCUTANEOUS CORONARY STENT INTERVENTION (PCI-S)      Prior to Admission medications   Medication Sig Start Date End Date Taking? Authorizing Provider  allopurinol (ZYLOPRIM) 100 MG tablet Take 0.5 tablets (50 mg total) by mouth every other day. 12/21/17 03/08/18  Henreitta Leber, MD  amiodarone (PACERONE) 200 MG tablet Take 200 mg by mouth daily. 09/14/17   [provider]  amLODipine (NORVASC) 10 MG tablet Take 10 mg by mouth daily. 10/16/17   [provider]  aspirin EC 81 MG EC tablet Take 1 tablet (81 mg total) by mouth daily. 03/10/18   Fritzi Mandes, MD  atorvastatin (LIPITOR) 80 MG tablet Take 80 mg by mouth every evening.  09/14/17   [provider]  BRILINTA 90 MG TABS tablet Take 90 mg by mouth 2 (two) times daily. 09/14/17   [provider]  bumetanide (BUMEX) 2 MG tablet Take 2 mg by mouth daily. 09/14/17   [provider]  carvedilol (COREG) 25 MG tablet Take 25-50 mg by mouth See admin instructions. Take 1 tablet (25MG ) by mouth every morning and 2 tablets (50MG ) by mouth every night 10/16/17   [provider]  cholecalciferol (VITAMIN D) 1000 units tablet Take 5,000 Units by mouth daily.    [provider]  cyclobenzaprine (FLEXERIL) 10 MG tablet Take 10 mg  by mouth 3 (three) times daily as needed for muscle pain. 03/01/18   [provider]  ezetimibe (ZETIA) 10 MG tablet Take 10 mg by mouth daily. 10/14/17   [provider]  famotidine (PEPCID) 20 MG tablet Take 20 mg by mouth daily. 09/14/17   [provider]  FLUoxetine (PROZAC) 20 MG capsule Take 60 mg by mouth at bedtime.  12/04/17   [provider]  heparin 25000-0.45 UT/250ML-% infusion Inject 2,000 Units/hr into the vein continuous. 03/10/18   Fritzi Mandes, MD  hydrALAZINE  (APRESOLINE) 25 MG tablet Take 1 tablet (25 mg total) by mouth 3 (three) times daily for 15 days. 03/31/18 04/15/18  Arta Silence, MD  insulin aspart (NOVOLOG) 100 UNIT/ML injection Inject 15 Units into the skin 3 (three) times daily with meals. 12/20/17   Henreitta Leber, MD  insulin detemir (LEVEMIR) 100 UNIT/ML injection Inject 0.4 mLs (40 Units total) into the skin 2 (two) times daily. 12/20/17   Henreitta Leber, MD  isosorbide mononitrate (IMDUR) 30 MG 24 hr tablet Take 1 tablet (30 mg total) by mouth daily. 03/12/18   Epifanio Lesches, MD  nitroGLYCERIN (NITROSTAT) 0.4 MG SL tablet Place 0.4 mg under the tongue every 5 (five) minutes as needed for chest pain.    [provider]  RENVELA 800 MG tablet Take 1,600 mg by mouth 3 (three) times daily with meals. 10/14/17   [provider]  traMADol (ULTRAM) 50 MG tablet Take 1 tablet (50 mg total) by mouth every 6 (six) hours as needed. 12/20/17   Henreitta Leber, MD  VOLTAREN 1 % GEL Apply 2 g topically 3 (three) times daily. 12/04/17   [provider]    Allergies Morphine and related  Family History  Problem Relation Age of Onset  . Diabetes Mother   . Peripheral vascular disease Mother   . COPD Father     Social History Social History   Tobacco Use  . 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 Smokeless tobacco: Current User    Types: Snuff  Substance Use Topics  . Alcohol use: Never    Frequency: Never  . Drug use: Never    Review of Systems Constitutional: No fever/chills Eyes: No visual changes. ENT: No sore throat. No stiff neck no neck pain Cardiovascular: Denies chest pain. Respiratory: Denies shortness of breath. Gastrointestinal:   no vomiting.  No diarrhea.  No constipation. Genitourinary: Negative for dysuria. Musculoskeletal: Negative lower extremity swelling Skin: Negative for rash. Neurological: Negative for severe headaches, focal weakness or  numbness.   ____________________________________________   PHYSICAL EXAM:  VITAL SIGNS: ED Triage Vitals  Enc Vitals Group     BP 04/29/18 1859 (!) 144/97     Pulse Rate 04/29/18 1858 (!) 114     Resp 04/29/18 1858 (!) 23     Temp 04/29/18 1858 98.1 F (36.7 C)     Temp Source 04/29/18 1858 Oral     SpO2 04/29/18 1858 95 %     Weight 04/29/18 1858 (!) 335 lb 1.6 oz (152 kg)     Height 04/29/18 1858 6\' 3"  (1.905 m)     Head Circumference --      Peak Flow --      Pain Score 04/29/18 1858 7     Pain Loc --      Pain Edu? --      Excl. in [place]? --     Constitutional: Alert and oriented. Well appearing and in no acute  distress. Eyes: Conjunctivae are normal Head: Atraumatic HEENT: No congestion/rhinnorhea. Mucous membranes are moist.  Oropharynx non-erythematous Neck:   Nontender with no meningismus, no masses, no stridor Cardiovascular: Tachycardia noted regular rhythm. Grossly normal heart sounds.  Good peripheral circulation. Respiratory: Normal respiratory effort.  No retractions.  Diminished in the bases, morbid obesity limits exam Abdominal: Soft and nontender. No distention. No guarding no rebound obesity noted Back:  There is no focal tenderness or step off.  there is no midline tenderness there are no lesions noted. there is no CVA tenderness Musculoskeletal: No lower extremity tenderness, no upper extremity tenderness. No joint effusions, no DVT signs strong distal pulses no edema Neurologic:  Normal speech and language. No gross focal neurologic deficits are appreciated.  Baseline left foot drop noted Skin:  Skin is warm, dry and intact. No rash noted. Psychiatric: Mood and affect are quite anxious, calms down as I speak to him. Speech and behavior are normal.  ____________________________________________   LABS (all labs ordered are listed, but only abnormal results are displayed)  Labs Reviewed  CBC WITH DIFFERENTIAL/PLATELET - Abnormal; Notable for the  following components:      Result Value   WBC 11.1 (*)    Hemoglobin 12.3 (*)    HCT 38.5 (*)    Neutro Abs 8.0 (*)    All other components within normal limits  PROTIME-INR  INFLUENZA PANEL BY PCR (TYPE A & B)  TROPONIN I  BRAIN NATRIURETIC PEPTIDE  COMPREHENSIVE METABOLIC PANEL  ETHANOL    Pertinent labs  results that were available during my care of the patient were reviewed by me and considered in my medical decision making (see chart for details). ____________________________________________  EKG  I personally interpreted any EKGs ordered by me or triage Sinus tachycardia, rate 115, normal axis, no acute ST elevation or depression, nonspecific ST changes. ____________________________________________  RADIOLOGY  Pertinent labs & imaging results that were available during my care of the patient were reviewed by me and considered in my medical decision making (see chart for details). If possible, patient and/or family made aware of any abnormal findings.  No results found. ____________________________________________    PROCEDURES  Procedure(s) performed: None  Procedures  Critical Care performed: None  ____________________________________________   INITIAL IMPRESSION / ASSESSMENT AND PLAN / ED COURSE  Pertinent labs & imaging results that were available during my care of the patient were reviewed by me and considered in my medical decision making (see chart for details).  Here with a cough and feeling lightheaded, does have a history of CHF, cardiac catheterization.  He is slightly tachycardic but he was quite anxious upon initial arrival.  Patient's lungs are clear, and he only complains of feeling a little lightheaded which is not unusual for him, and he complains of a cough.  Chest x-ray shows no evidence of pneumonia he does not have influenza, blood work is close to baseline for him.  His creatinine for example is 3 which is about where he usually stays.  His  blood sugar somewhat high but he did have some sugar-containing beverage prior to arrival and it is about slightly higher than average for him but not remarkably so.  No evidence of DKA.  Cardiac enzymes are negative, EKG does not show STEMI, he is not complaining of anything but pain when he coughs.   ----------------------------------------- 8:22 PM on 04/29/2018 -----------------------------------------  Patient states his only complaint is that he does not like coughing, and that is why he is  here.  I have explained him I cannot get rid of a cough from a viral syndrome which is what it appears he has.  There is no evidence this is from CHF or cardiogenic cough.  Was there any evidence of an acute bacterial infection requiring antibiotics.  He has been on inhalers in the past and he thinks a inhaler might help him.  I will give him a dose of albuterol here.  He is not actually coughing in the room and I have not heard him cough, chest x-ray is reassuring vital signs are normal at this time sats are 76 I think he will be able to go home.  ----------------------------------------- 8:53 PM on 04/29/2018 -----------------------------------------  Feels subjectively much better after albuterol, vital signs are normal, has some reproducible chest wall pain, but no evidence of ACS, states this is absolutely different from all his prior heart issues, we talked about admission versus discharge he wants to be discharged he would like to go home right now.  He states his sugar is up because he just ate he wants to go home and take his home medications.  At this time, there does not appear to be clinical evidence to support the diagnosis of pulmonary embolus, dissection, myocarditis, endocarditis, pericarditis, pericardial tamponade, acute coronary syndrome, pneumothorax, pneumonia, or any other acute intrathoracic pathology that will require admission or acute intervention. Nor is there evidence of any  significant intra-abdominal pathology causing this discomfort.  Return precautions and follow-up given understood patient has no pain unless he coughs   ____________________________________________   FINAL CLINICAL IMPRESSION(S) / ED DIAGNOSES  Final diagnoses:  Cough      This chart was dictated using voice recognition software.  Despite best efforts to proofread,  errors can occur which can change meaning.      Schuyler Amor, MD 04/29/18 2002    Schuyler Amor, MD 04/29/18 2023    Schuyler Amor, MD 04/29/18 2053

## 2018-04-29 NOTE — ED Triage Notes (Signed)
PT arrives with chest tightness, dizziness, and weakness.

## 2018-04-29 NOTE — Unmapped (Signed)
I already sent these.

## 2018-04-29 NOTE — Unmapped (Signed)
Cardiac Implantable Electronic Device Remote Monitoring     Visit Date:  04/27/2018    Findings: Normal device function, Tested Lead measurements stable and within normal range, Adequate battery reserve    Arrhythmias: 3 NS-VT episodes and 10 ATR episodes recorded longest episode 1 minute 8 seconds EGM appears to show a short run of AF.     Plan: Continue Routine Remote Monitoring   __________________________________________________________________    Manufacturer of Device: [employer]       Type of Device: Oceanographer  See scanned/downloaded PDF report for model numbers, serial numbers, and date(s) of implant.    Presenting Rhythm: AS/VS    ______________________________________________________________________    Heart Failure Monitoring: Not Applicable  ______________________________________________________________________      Please see downloaded PDF file of transmission under Media Tab  for full details of device interrogation to include, when applicable,  battery status/charge time, lead trend data, and programmed parameters.

## 2018-04-30 MED ORDER — PRALUENT PEN 75 MG/ML SUBCUTANEOUS PEN INJECTOR
SUBCUTANEOUS | 11 refills | 0 days | Status: CP
Start: 2018-04-30 — End: 2018-05-01

## 2018-04-30 MED FILL — OZEMPIC 0.25 MG OR 0.5 MG (2 MG/1.5 ML) SUBCUTANEOUS PEN INJECTOR: 56 days supply | Qty: 2 | Fill #0 | Status: AC

## 2018-04-30 MED FILL — ACETAMINOPHEN 500 MG TABLET: 30 days supply | Qty: 180 | Fill #2 | Status: AC

## 2018-04-30 MED FILL — LOSARTAN 50 MG TABLET: 90 days supply | Qty: 90 | Fill #0 | Status: AC

## 2018-04-30 MED FILL — HYDRALAZINE 25 MG TABLET: 90 days supply | Qty: 270 | Fill #0 | Status: AC

## 2018-04-30 MED FILL — VENTOLIN HFA 90 MCG/ACTUATION AEROSOL INHALER: RESPIRATORY_TRACT | 25 days supply | Qty: 18 | Fill #0

## 2018-04-30 MED FILL — ACETAMINOPHEN 500 MG TABLET: 30 days supply | Qty: 180 | Fill #2

## 2018-04-30 MED FILL — VENTOLIN HFA 90 MCG/ACTUATION AEROSOL INHALER: 25 days supply | Qty: 18 | Fill #0 | Status: AC

## 2018-04-30 NOTE — Unmapped (Signed)
Pt.s wife [name] called stating that pt.s insurance would not cover pt.s Praluent pen. Pt. Left message that she was angry and very upset, in calling pt. [name] answered phone and became very abusive in language and rude and hung up ending phone call while I was specking to explain and help. So  No help nor solution arrived at. Sending message to [employer]

## 2018-04-30 NOTE — Unmapped (Signed)
Patient with significant CAD, s/p multiple stents and LDL above goal.  Repatha was denied by insurance, Praluent is preferred brand.  New Rx sent to [facility].      Armando Gang, PharmD, BCACP, CPP  Heart and Vascular Clinical Pharmacist Practitioner  [address]  [place], [place] [address]  Scheduling Phone #: [phone]   Clinic Phone #: [phone]  Pgr #: [id number] Mon/Wed/Thu

## 2018-04-30 NOTE — Unmapped (Signed)
CCM Follow-up Call      Provider Cardiology Action :are you able to order Praluent rx for pt and send to [facility]?  It is the pt's insurance co. preferred medication.     CCM Action:f/u PA for Evolocumab rx. MAP working on need for PA    Patient Action:f/u Monday if does not hear back re: evolocumab rx pick up    CCM spoke to MAP -  MAP states: Praluent rx - is covered by insurance.- preferred list.   PA needed if pt unable to use Praluent rx instead of Evolocumab rx    Will notify provider.

## 2018-04-30 NOTE — Unmapped (Signed)
CCM Follow-up Call      PCP Action:None     CCM Action:f/u PA evolocumab rx. CCM asked PA assitance in clinic, will call pt's insurance to see if can get approved over phone    Patient Action:f/u tomorrow re: PA      Pt's wife , [name], calls in hysterics that evolocumab rx is being denied.    Discussed steps to getting it approved.  Wife is calmed slightly , wife has many variables upsetting her today re:  David's condition, ED visit yesterday, pt is home now and taking all meds.  Eviction letter  I don't have any family that helps  Knee pain d/t fall at [employer] so can't work and unsure will get worker's comp.  Pt in ED yesterday, CP, troponin neg.     Pt home today. Wife states he sleeps all day, CCM recommended taking less seroquel, wife states he needs it b/c of HAs. Offered appt w/ Dr. Rulon Eisenmenger re: HA control, wife denies need.    CCM will follow up with PA evolocumab

## 2018-05-01 MED ORDER — PRALUENT PEN 75 MG/ML SUBCUTANEOUS PEN INJECTOR
SUBCUTANEOUS | 11 refills | 0.00000 days | Status: CP
Start: 2018-05-01 — End: 2018-06-24
  Filled 2018-05-03: qty 2, 28d supply, fill #0

## 2018-05-01 NOTE — Unmapped (Signed)
Fantastic job, Toniann Fail!  Thanks for your hard work!

## 2018-05-01 NOTE — Unmapped (Signed)
CCM Follow-up Call    PCP Action:None     CCM Action:f/u MAP requested new medication, also needs PA will f/u next week. - will call MAP. Per Hoy Morn rx also needs PA. After MAP had stated it's a preferred medication. Spoke to pt re: wear CPAP, stop neurontin if too sleepy    Patient Action:use CPAP , stop neurontin at night to decrease. extreme fatigue. Pt states has a cold, with all the coughing, doesn't mind sleeping right now.  Pt with no c/o cp today. States is feeling better today than yesterday,was told it's a virus, will go to urgent care if becomes febrile.     Pt states, I tried CPAP for a week it didn't do any good    Discussed benefits of CPAP w/ pt , something that needs practice to have it work. Verbalizes understanding. Will not commit to using CPAP but understands it is the recommendation from providers.     Pt will tell wife CCM will follow up next week re: PA for new cholesterol [place] medication

## 2018-05-01 NOTE — Unmapped (Signed)
Please coordinate with cardiology regarding PA for evolocumab.    I had asked him to temporarily stop gabapentin to see if it improves his somnolence, but more importantly he needs to be compliance with CPAP.  Please follow-up with him regarding these issues.    I was the supervising physician in the delivery of the service. Kurtis Bushman, MD

## 2018-05-01 NOTE — Unmapped (Signed)
I was the supervising physician in the delivery of the service. Gigi Onstad D Zariya Minner, MD

## 2018-05-03 MED ORDER — EMPTY CONTAINER
3 refills | 0 days
Start: 2018-05-03 — End: ?

## 2018-05-03 MED FILL — EMPTY CONTAINER: 120 days supply | Qty: 1 | Fill #0 | Status: AC

## 2018-05-03 MED FILL — EMPTY CONTAINER: 120 days supply | Qty: 1 | Fill #0

## 2018-05-03 MED FILL — PRALUENT PEN 75 MG/ML SUBCUTANEOUS PEN INJECTOR: 28 days supply | Qty: 2 | Fill #0 | Status: AC

## 2018-05-03 NOTE — Unmapped (Signed)
The Internal Medicine CCM team was unable to reach Jeremy Oliver to follow-up in regards to the Chronic Care Management Program. A voicemail was left with a request to return our call.

## 2018-05-03 NOTE — Unmapped (Signed)
[facility]   Patient Onboarding/Medication Counseling    [name] is a 48 y.o. male with hyperlipidemia who I am counseling today on initiation of therapy.    Verified patient's date of birth / HIPAA.    Specialty medication(s) to be sent: General Specialty: Praluent      Non-specialty medications/supplies to be sent: sharps      Medications not needed at this time: none         Praluent (alirocumab)    Medication & Administration     Dosage: Inject the contents of 1 pen (75mg ) under the skin every 2 weeks.    Administration: Inject under the skin of the thigh, abdomen or upper arm. Rotate sites with each injection.  o Patient declined counseling on administration    Adherence/Missed dose instructions: Administer a missed dose within 7 days and resume your normal schedule. If it has been more than 7 days and you inject every 2 weeks, skip the missed dose and resume your normal schedule..     Goals of Therapy     Lower cholesterol  Lower the risk of heart attack, stroke and unstable angina in people with heart disease    Side Effects & Monitoring Parameters   ? Injection site irritation  ? Flu-like symptoms  ? Nose or throat irritation    The following side effects should be reported to the provider:  ? Signs of an allergic reaction    Contraindications, Warnings, & Precautions     ? Hypersensitivity    Drug/Food Interactions     ? Medication list reviewed in Epic. The patient was instructed to inform the care team before taking any new medications or supplements. No drug interactions identified.     Storage, Handling Precautions, & Disposal     ? Praluent should be stored in the refrigerator.   o If necessary Praluent [place] be stored at room temperature, in the original carton, for no more than 30 days.  ? Place used devices into a sharps container for disposal      Current Medications (including OTC/herbals), Comorbidities and Allergies     Current Outpatient Medications   Medication Sig Dispense Refill   ??? acetaminophen (TYLENOL) 500 MG tablet TAKE 2 TABLETS (1 000MG ) BY MOUTH EVERY 8 HOURS AS NEEDED FOR PAIN 180 each 3   ??? albuterol HFA 90 mcg/actuation inhaler Inhale 2 puffs into the lungs every 6 (six) hours as needed for wheezing or shortness of breath. 18 g 2   ??? alirocumab (PRALUENT PEN) 75 mg/mL PnIj INJECT THE CONTENTS OF 1 PEN (75 MG) UNDER THE SKIN EVERY 14 DAYS 2 mL 11   ??? allopurinol (ZYLOPRIM) 100 MG tablet Take 0.5 tablets (50 mg total) by mouth every other day. 30 tablet 11   ??? amiodarone (PACERONE) 200 MG tablet TAKE 1 TABLET BY MOUTH ONCE DAILY 90 tablet 3   ??? amLODIPine (NORVASC) 10 MG tablet Take 1 tablet (10 mg total) by mouth daily. 90 tablet PRN   ??? atorvastatin (LIPITOR) 80 MG tablet Take 1 tablet (80 mg total) by mouth daily. 90 tablet 3   ??? blood sugar diagnostic Strp USE TO CHECK BLOOD SUGAR 4 TIMES DAILY (BEFORE MEALS AND NIGHTLY) 100 each 3   ??? blood-glucose meter kit Use to check blood sugars, #1 meter, #100 strips. Freestyle glucose meter. ICD-9 250.00 1 each 0   ??? blood-glucose sensor (FREESTYLE NAVIGATOR GLUC SENS) Devi Frequency:BID   Dosage:0.0     Instructions:  Note:Dose: 1     ??? bumetanide (BUMEX) 2 MG tablet TAKE 1 TABLET BY MOUTH ONCE DAILY 90 tablet 3   ??? carvedilol (COREG) 25 MG tablet TAKE 1 TABLET BY MOUTH WITH THE MORNING MEAL AND TAKE 2 TABLETS WITH THE EVENING MEAL 270 tablet 3   ??? cholecalciferol, vitamin D3, 5,000 unit capsule TAKE 1 CAPSULE (5 000 UNITS) BY MOUTH DAILY 100 capsule 0   ??? diclofenac sodium (VOLTAREN) 1 % gel APPLY 2 GRAMS TOPICALLY THREE TIMES DAILY 900 g 5   ??? ezetimibe (ZETIA) 10 mg tablet TAKE 1 TABLET BY MOUTH ONCE DAILY 90 each 3   ??? FLUoxetine (PROZAC) 20 MG capsule TAKE 3 CAPSULES (60MG ) BY MOUTH DAILY 270 capsule 3   ??? gabapentin (NEURONTIN) 300 MG capsule Take 2 capsules (600 mg total) by mouth nightly. 180 capsule 3   ??? hydrALAZINE (APRESOLINE) 25 MG tablet Take 1 tablet (25 mg total) by mouth Three (3) times a day. 270 tablet 3   ??? insulin ASPART (NOVOLOG FLEXPEN U-100 INSULIN) 100 unit/mL (3 mL) injection pen Inject 0.15 mL (15 Units total) under the skin Three (3) times a day with a meal. 15 mL 11   ??? insulin detemir U-100 (LEVEMIR FLEXTOUCH U-100 INSULN) 100 unit/mL (3 mL) injection pen Inject 0.4 mL (40 Units total) under the skin two (2) times a day. 15 mL 11   ??? insulin syringe-needle U-100 0.3 mL 31 gauge x 5/16 Syrg USE TO INJECT INSULIN TWICE DAILY WITH MEALS 60 each 0   ??? isosorbide mononitrate (IMDUR) 30 MG 24 hr tablet Take 1 tablet (30 mg total) by mouth daily. 90 tablet 3   ??? lancets 30 gauge Misc USE TO CHECK BLOOD SUGAR THREE TIMES DAILY WITH MEALS 250 each 2   ??? losartan (COZAAR) 50 MG tablet Take 1 tablet (50 mg total) by mouth daily. 90 tablet 3   ??? melatonin 3 mg cap Take 9 mg by mouth nightly.     ??? nitroglycerin (NITROSTAT) 0.4 MG SL tablet Place 1 tablet (0.4 mg total) under the tongue every five (5) minutes as needed for chest pain. 100 each 3   ??? QUEtiapine (SEROQUEL) 50 MG tablet Take 1 tablet (50 mg total) by mouth two (2) times a day as needed (headache). 180 tablet 3   ??? rivaroxaban (XARELTO) 15 mg Tab Take 1 tablet (15 mg total) by mouth daily with evening meal. 90 tablet 3   ??? semaglutide 0.25 mg or 0.5 mg(2 mg/1.5 mL) PnIj Inject 0.25 mg under the skin every seven (7) days. For 4 weeks then increase to 0.5mg  every 7 days 7.5 mL 0   ??? sevelamer (RENVELA) 800 mg tablet TAKE 2 TABLETS (1600MG ) BY MOUTH THREE TIMES DAILY WITH MEALS 540 tablet 3   ??? ticagrelor (BRILINTA) 90 mg Tab TAKE 1 TABLET BY MOUTH TWICE DAILY 180 tablet 3   ??? traMADol (ULTRAM) 50 mg tablet Take 1 tablet (50 mg total) by mouth every six (6) hours as needed. 30 tablet 0   ??? UNIFINE PENTIPS 31 gauge x 5/16 Ndle USE WITH INSULIN AND VICTOZA 5 TIMES DAILY 400 each 3     No current facility-administered medications for this visit.        Allergies   Allergen Reactions   ??? Morphine Palpitations     Medication error: was administered 20mL instead of 2mL       Patient Active Problem List   Diagnosis   ??? CAD (coronary artery  disease) (RAF-[name])   ??? Idiopathic chronic gout of left knee   ??? Hypercholesteremia   ??? Essential hypertension (RAF-[name])   ??? 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 Osteoarthritis   ??? Chronic nonalcoholic liver disease   ??? GERD (gastroesophageal reflux disease)   ??? Mild intermittent asthma without complication   ??? Chronic pain syndrome   ??? Paroxysmal atrial fibrillation (CMS-[name])   ??? OSA (obstructive sleep apnea)   ??? Tobacco use disorder   ??? Status post cardiac pacemaker procedure   ??? Type 2 diabetes mellitus with stage 4 chronic kidney disease, with long-term current use of insulin (CMS-[name])   ??? Cardiac pacemaker in situ   ??? Recurrent major depressive disorder, in full remission (CMS-[name])   ??? Adjustment disorder, unspecified   ??? Chronic heart failure with reduced ejection fraction and diastolic dysfunction (CMS-[name])   ??? Hyperkalemia   ??? CKD (chronic kidney disease) stage 4, GFR 15-29 ml/min (CMS-[name])   ??? Acquired left foot drop   ??? Decreased sensation of lower extremity, left   ??? Chronic anticoagulation       Reviewed and up to date in Epic.    Appropriateness of Therapy     Is medication and dose appropriate based on diagnosis? Yes    Baseline Quality of Life Assessment     How many days over the past month did your disease keep you from your normal activities? 0    Financial Information     Medication Assistance provided: Prior Authorization and [employer]    Anticipated copay of $0 reviewed with patient. Verified delivery address.    Delivery Information     Scheduled delivery date: 05/03/18    Expected start date: 05/03/18    Medication will be delivered via Same Day Courier to the home address in [place].  This shipment will not require a signature.      Explained the services we provide at [facility] and that each month we would call to set up refills.  Stressed importance of returning phone calls so that we could ensure they receive their medications in time each month.  Informed patient that we should be setting up refills 7-10 days prior to when they will run out of medication.  A pharmacist will reach out to perform a clinical assessment periodically.  Informed patient that a welcome packet and a drug information handout will be sent.      Patient verbalized understanding of the above information as well as how to contact the pharmacy at [phone] option 4 with any questions/concerns.  The pharmacy is open Monday through Friday 8:30am-4:30pm.  A pharmacist is available 24/7 via pager to answer any clinical questions they Shake have.    Patient Specific Needs     ? Does the patient have any physical, cognitive, or cultural barriers? No    ? Patient prefers to have medications discussed with  Family Member     ? Is the patient able to read and understand education materials at a high school level or above? Yes    ? Patient's primary language is  English     ? Is the patient high risk? No     ? Does the patient require a Care Management Plan? No     ? Does the patient require physician intervention or other additional services (i.e. nutrition, smoking cessation, social work)? No  Arnold Long  [facility] Specialty Pharmacist

## 2018-05-06 NOTE — Unmapped (Signed)
Per conversation with Toniann Fail she is trying to get Dr. Jake Seats and get the PA handled

## 2018-05-10 ENCOUNTER — Emergency Department: Payer: Medicare Other

## 2018-05-10 ENCOUNTER — Encounter: Payer: Self-pay | Admitting: Emergency Medicine

## 2018-05-10 ENCOUNTER — Emergency Department
Admission: EM | Admit: 2018-05-10 | Discharge: 2018-05-11 | Discharge status: Against Medical Advice | Payer: Medicare Other | Attending: Emergency Medicine | Admitting: Emergency Medicine

## 2018-05-10 ENCOUNTER — Other Ambulatory Visit: Payer: Self-pay

## 2018-05-10 DIAGNOSIS — R9431 Abnormal electrocardiogram [ECG] [EKG]: Secondary | ICD-10-CM

## 2018-05-10 DIAGNOSIS — E119 Type 2 diabetes mellitus without complications: Secondary | ICD-10-CM | POA: Diagnosis not present

## 2018-05-10 DIAGNOSIS — I480 Paroxysmal atrial fibrillation: Secondary | ICD-10-CM | POA: Diagnosis not present

## 2018-05-10 DIAGNOSIS — R7989 Other specified abnormal findings of blood chemistry: Secondary | ICD-10-CM

## 2018-05-10 DIAGNOSIS — F17220 Nicotine dependence, chewing tobacco, uncomplicated: Secondary | ICD-10-CM | POA: Insufficient documentation

## 2018-05-10 DIAGNOSIS — Z794 Long term (current) use of insulin: Secondary | ICD-10-CM | POA: Diagnosis not present

## 2018-05-10 DIAGNOSIS — I110 Hypertensive heart disease with heart failure: Secondary | ICD-10-CM | POA: Diagnosis not present

## 2018-05-10 DIAGNOSIS — I5022 Chronic systolic (congestive) heart failure: Secondary | ICD-10-CM | POA: Insufficient documentation

## 2018-05-10 DIAGNOSIS — Z79899 Other long term (current) drug therapy: Secondary | ICD-10-CM | POA: Insufficient documentation

## 2018-05-10 DIAGNOSIS — I2510 Atherosclerotic heart disease of native coronary artery without angina pectoris: Secondary | ICD-10-CM | POA: Diagnosis not present

## 2018-05-10 DIAGNOSIS — R079 Chest pain, unspecified: Secondary | ICD-10-CM | POA: Diagnosis not present

## 2018-05-10 DIAGNOSIS — R778 Other specified abnormalities of plasma proteins: Secondary | ICD-10-CM

## 2018-05-10 LAB — BASIC METABOLIC PANEL
Anion gap: 9 (ref 5–15)
BUN: 41 mg/dL — ABNORMAL HIGH (ref 6–20)
CO2: 21 mmol/L — ABNORMAL LOW (ref 22–32)
CREATININE: 3.01 mg/dL — AB (ref 0.61–1.24)
Calcium: 8.7 mg/dL — ABNORMAL LOW (ref 8.9–10.3)
Chloride: 107 mmol/L (ref 98–111)
GFR calc Af Amer: 27 mL/min — ABNORMAL LOW (ref >60)
GFR calc non Af Amer: 23 mL/min — ABNORMAL LOW (ref >60)
Glucose, Bld: 232 mg/dL — ABNORMAL HIGH (ref 70–99)
Potassium: 4.8 mmol/L (ref 3.5–5.1)
Sodium: 137 mmol/L (ref 135–145)

## 2018-05-10 LAB — CBC
HCT: 38.6 % — ABNORMAL LOW (ref 39.0–52.0)
Hemoglobin: 12 g/dL — ABNORMAL LOW (ref 13.0–17.0)
MCH: 26.8 pg (ref 26.0–34.0)
MCHC: 31.1 g/dL (ref 30.0–36.0)
MCV: 86.2 fL (ref 80.0–100.0)
Platelets: 278 10*3/uL (ref 150–400)
RBC: 4.48 MIL/uL (ref 4.22–5.81)
RDW: 14.3 % (ref 11.5–15.5)
WBC: 10.8 10*3/uL — ABNORMAL HIGH (ref 4.0–10.5)
nRBC: 0 % (ref 0.0–0.2)

## 2018-05-10 LAB — TROPONIN I: Troponin I: 0.03 ng/mL (ref <0.03)

## 2018-05-10 MED ORDER — DIAZEPAM 5 MG PO TABS
5.0000 mg | ORAL_TABLET | Freq: Once | ORAL | Status: AC
  Administered 2018-05-11: 5 mg via ORAL
  Filled 2018-05-10: qty 1

## 2018-05-10 MED ORDER — SODIUM CHLORIDE 0.9% FLUSH
3.0000 mL | Freq: Once | INTRAVENOUS | Status: AC
  Administered 2018-05-10: 3 mL via INTRAVENOUS

## 2018-05-10 NOTE — ED Notes (Signed)
Date and time results received: 05/10/18 11:22 PM   Test: troponin Critical Value: 0.03  Name of Provider Notified: Dr Cinda Quest

## 2018-05-10 NOTE — ED Provider Notes (Addendum)
[facility] Emergency Department Provider Note   ____________________________________________   First MD Initiated Contact with Patient 05/10/18 2304     (approximate)  I have reviewed the triage vital signs and the nursing notes.   HISTORY  Chief Complaint Chest Pain    HPI Edwin [name] is a 48 y.o. male who reports he has been having pain radiating around the left side of his chest starting the back going to the front and encompassing the whole left chest happens worse when he moves a certain way.  Palpation of the back part of his chest just below the tip of the shoulder blade exactly reproduces his pain.  Patient also complains of nausea with walking too far.  He says the nausea also can come on when he goes from hot to cold or cold to hot for example he says when he got out of his car to come into the ER he got nauseated is getting out of the car.  Patient recently had 3 stents put in at [facility] but he has 3 other vessels which needs stenting but cannot be accessed.  His [facility] cardiologist said he he is not a candidate for open heart surgery because doing so could kill him.  Past Medical History:  Diagnosis Date  . CAD (coronary artery disease)   . Chronic systolic heart failure ([place])   . Diabetes mellitus without complication ([place])   . Heart attack ([place])   . Hyperlipidemia   . Hypertension   . Ischemic cardiomyopathy   . Paroxysmal atrial fibrillation [facility])     Patient Active Problem List   Diagnosis Date Noted  . NSTEMI (non-ST elevated myocardial infarction) ([place]) 03/08/2018  . Infection of left knee ([place]) 12/16/2017    Past Surgical History:  Procedure Laterality Date  . CARDIAC SURGERY    . IRRIGATION AND DEBRIDEMENT KNEE Left 12/17/2017   Procedure: LEFT KNEE DEBRIDEMENT AND SYNOVECTOMY;  Surgeon: Thornton Park, MD;  Location: [facility] ORS;  Service: Orthopedics;  Laterality: Left;  . JOINT REPLACEMENT    . LEFT HEART CATH AND CORONARY  ANGIOGRAPHY Left 03/09/2018   Procedure: LEFT HEART CATH AND CORONARY ANGIOGRAPHY;  Surgeon: Nelva Bush, MD;  Location: [place] CV [facility];  Service: Cardiovascular;  Laterality: Left;  . PERCUTANEOUS CORONARY STENT INTERVENTION (PCI-S)      Prior to Admission medications   Medication Sig Start Date End Date Taking? Authorizing Provider  albuterol (PROVENTIL HFA;VENTOLIN HFA) 108 (90 Base) MCG/ACT inhaler Inhale 2 puffs into the lungs every 6 (six) hours as needed for wheezing or shortness of breath. 04/29/18  Yes McShane, Gerda Diss, MD  Alirocumab (PRALUENT) 75 MG/ML SOAJ Inject 75 mg into the skin every 14 (fourteen) days. 05/01/18  Yes [provider]  allopurinol (ZYLOPRIM) 100 MG tablet Take 50 mg by mouth every other day. 03/18/18  Yes [provider]  amiodarone (PACERONE) 200 MG tablet Take 200 mg by mouth daily. 09/14/17  Yes [provider]  amLODipine (NORVASC) 10 MG tablet Take 10 mg by mouth daily. 10/16/17  Yes [provider]  atorvastatin (LIPITOR) 80 MG tablet Take 80 mg by mouth every evening.  09/14/17  Yes [provider]  BRILINTA 90 MG TABS tablet Take 90 mg by mouth 2 (two) times daily. 09/14/17  Yes [provider]  bumetanide (BUMEX) 2 MG tablet Take 2 mg by mouth daily. 09/14/17  Yes [provider]  carvedilol (COREG) 25 MG tablet Take 25-50 mg by mouth  See admin instructions. Take 1 tablet (25MG ) by mouth every morning and 2 tablets (50MG ) by mouth every night 10/16/17  Yes [provider]  cholecalciferol (VITAMIN D) 1000 units tablet Take 5,000 Units by mouth daily.   Yes [provider]  ezetimibe (ZETIA) 10 MG tablet Take 10 mg by mouth daily. 10/14/17  Yes [provider]  famotidine (PEPCID) 20 MG tablet Take 20 mg by mouth daily. 09/14/17  Yes [provider]  FLUoxetine (PROZAC) 20 MG capsule Take 60 mg by mouth at bedtime.  12/04/17  Yes [provider]    gabapentin (NEURONTIN) 300 MG capsule Take 600 mg by mouth at bedtime. 03/18/18 06/16/18 Yes [provider]  hydrALAZINE (APRESOLINE) 25 MG tablet Take 25 mg by mouth 3 (three) times daily. 04/28/18  Yes [provider]  insulin aspart (NOVOLOG) 100 UNIT/ML injection Inject 15 Units into the skin 3 (three) times daily with meals. 12/20/17  Yes Sainani, Belia Heman, MD  insulin detemir (LEVEMIR) 100 UNIT/ML injection Inject 0.4 mLs (40 Units total) into the skin 2 (two) times daily. Patient taking differently: Inject 40 Units into the skin at bedtime.  12/20/17  Yes Henreitta Leber, MD  isosorbide mononitrate (IMDUR) 30 MG 24 hr tablet Take 1 tablet (30 mg total) by mouth daily. 03/12/18  Yes Epifanio Lesches, MD  losartan (COZAAR) 50 MG tablet Take 50 mg by mouth daily. 04/28/18  Yes [provider]  Melatonin 3 MG CAPS Take 9 mg by mouth at bedtime.   Yes [provider]  nitroGLYCERIN (NITROSTAT) 0.4 MG SL tablet Place 0.4 mg under the tongue every 5 (five) minutes as needed for chest pain.   Yes [provider]  OZEMPIC, 0.25 OR 0.5 MG/DOSE, 2 MG/1.5ML SOPN Inject 0.25-0.5 mg into the skin every 7 (seven) days. Inject 0.25mg  under the skin every 7 days for 4 weeks, then increase to 0.5mg  every 7 days 04/23/18  Yes [provider]  QUEtiapine (SEROQUEL) 50 MG tablet Take 50 mg by mouth at bedtime as needed (headache).  03/18/18  Yes [provider]  RENVELA 800 MG tablet Take 1,600 mg by mouth 3 (three) times daily with meals. 10/14/17  Yes [provider]  traMADol (ULTRAM) 50 MG tablet Take 1 tablet (50 mg total) by mouth every 6 (six) hours as needed. 12/20/17  Yes Sainani, Belia Heman, MD  VOLTAREN 1 % GEL Apply 2 g topically 3 (three) times daily as needed (left leg pain).  12/04/17  Yes [provider]  XARELTO 15 MG TABS tablet Take 15 mg by mouth at bedtime.  03/14/18  Yes [provider]  aspirin EC 81 MG EC  tablet Take 1 tablet (81 mg total) by mouth daily. Patient not taking: Reported on 05/11/2018 03/10/18   Fritzi Mandes, MD  heparin 25000-0.45 UT/250ML-% infusion Inject 2,000 Units/hr into the vein continuous. Patient not taking: Reported on 05/11/2018 03/10/18   Fritzi Mandes, MD    Allergies Morphine and related  Family History  Problem Relation Age of Onset  . Diabetes Mother   . Peripheral vascular disease Mother   . COPD Father     Social History Social History   Tobacco Use  . 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 Smokeless tobacco: Current User    Types: Snuff  Substance Use Topics  . Alcohol use: Never    Frequency: Never  . Drug use: Never    Review of Systems  Constitutional: No fever/chills Eyes: No  visual changes. ENT: No sore throat. Cardiovascular: Denies chest pain at present. Respiratory: Denies shortness of breath. Gastrointestinal: No abdominal pain.   No diarrhea.  No constipation. Genitourinary: Negative for dysuria. Musculoskeletal: Negative for back pain. Skin: Negative for rash. Neurological: Negative for headaches, focal weakness   ____________________________________________   PHYSICAL EXAM:  VITAL SIGNS: ED Triage Vitals  Enc Vitals Group     BP 05/10/18 2216 (!) 147/97     Pulse Rate 05/10/18 2216 89     Resp 05/10/18 2216 16     Temp 05/10/18 2216 98.5 F (36.9 C)     Temp Source 05/10/18 2216 Oral     SpO2 05/10/18 2216 98 %     Weight 05/10/18 2210 (!) 335 lb 1.6 oz (152 kg)     Height 05/10/18 2210 6\' 2"  (1.88 m)     Head Circumference --      Peak Flow --      Pain Score 05/10/18 2238 5     Pain Loc --      Pain Edu? --      Excl. in [place]? --     Constitutional: Alert and oriented. Well appearing and in no acute distress. Eyes: Conjunctivae are normal.  Head: Atraumatic. Nose: No congestion/rhinnorhea. Mouth/Throat: Mucous membranes are moist.  Oropharynx non-erythematous. Neck: No stridor.  { Cardiovascular: Normal rate,  regular rhythm. Grossly normal heart sounds.  Good peripheral circulation. Respiratory: Normal respiratory effort.  No retractions. Lungs CTAB. Gastrointestinal: Soft and nontender. No distention. No abdominal bruits. No CVA tenderness. Musculoskeletal: No lower extremity tenderness nor edema.  Neurologic:  Normal speech and language. No gross focal neurologic deficits are appreciated.  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 No rash noted. Psychiatric: Mood and affect are normal. Speech and behavior are normal.  ____________________________________________   LABS (all labs ordered are listed, but only abnormal results are displayed)  Labs Reviewed  BASIC METABOLIC PANEL - Abnormal; Notable for the following components:      Result Value   CO2 21 (*)    Glucose, Bld 232 (*)    BUN 41 (*)    Creatinine, Ser 3.01 (*)    Calcium 8.7 (*)    GFR calc non Af Amer 23 (*)    GFR calc Af Amer 27 (*)    All other components within normal limits  CBC - Abnormal; Notable for the following components:   WBC 10.8 (*)    Hemoglobin 12.0 (*)    HCT 38.6 (*)    All other components within normal limits  TROPONIN I - Abnormal; Notable for the following components:   Troponin I 0.03 (*)    All other components within normal limits  CK - Abnormal; Notable for the following components:   Total CK 44 (*)    All other components within normal limits  SEDIMENTATION RATE - Abnormal; Notable for the following components:   Sed Rate 80 (*)    All other components within normal limits  TROPONIN I - Abnormal; Notable for the following components:   Troponin I 0.03 (*)    All other components within normal limits   ____________________________________________  EKG  EKG read interpreted by me shows flipped T's in 1 and L which are new from last month additionally he had 2 EKGs here today 1 of them was done at 2207 illness done at 2224 1 from 2207 showed a biphasic T  wave in V2 was  not present at 2204 T waves at 2204 were so slightly deeper than they were on the earlier EKG and both sets of flipped T waves are new from 31 March 2018 ____________________________________________  RADIOLOGY  ED MD interpretation: Chest x-ray read by radiology reviewed by me shows old left rib fractures but no acute changes  Official radiology report(s): Dg Chest 2 View  Result Date: 05/10/2018 CLINICAL DATA:  Chest pain EXAM: CHEST - 2 VIEW COMPARISON:  04/29/2018 FINDINGS: Fibrillator wires noted in the right heart, unchanged. Heart and mediastinal contours are within normal limits. No focal opacities or effusions. No acute bony abnormality. Old left rib fractures again noted, stable. IMPRESSION: No active cardiopulmonary disease. Electronically Signed   By: Rolm Baptise M.D.   On: 05/10/2018 23:02    ____________________________________________   PROCEDURES  Procedure(s) performed:  Procedures  Critical Care performed: Critical care time half an hour including reviewing the patient's old records speaking with Dr. Velva Harman and the hospitalist.  ____________________________________________   INITIAL IMPRESSION / [place] / ED COURSE  Pt takes praluent which can cause muscle spasm and angiitis.  I will run a sed rate and a CK.  I discussed the patient with Dr. Velva Harman.  Dr. Velva Harman feels that we can initiate treatment here check to see if he is having heart problems in addition to the muscle spasms.  Rib fractures that has can also be causing some of the pain.          ____________________________________________   FINAL CLINICAL IMPRESSION(S) / ED DIAGNOSES  Final diagnoses:  Chest pain, unspecified type  Elevated troponin  Abnormal EKG     ED Discharge Orders    None       Note:  This document was prepared using Dragon voice recognition software and [place] include unintentional dictation errors.    Nena Polio, MD 05/10/18  2356  Patient refuses to stay in the hospital.  He says he has too many things to do at home.  He is awake alert oriented he understands the consequences of his actions [place] lead to his death quickly possibly not even in the parking lot as he is walking out.  Is completely oriented and capable of making his own decisions.  I will let him go [place].  He needs to see his doctor as soon as possible.  I had offered to transfer him to [facility] as well.  He does not want to do that either.   Nena Polio, MD 05/11/18 [phone]

## 2018-05-10 NOTE — ED Triage Notes (Signed)
Pt presents to ED with chest pain that radiates to his back. Has been ongoing for the past 3 days. Pt states he has also been vomiting when he "walks around" or when he "gets too hot". Pt states he thinks it is muscular but does have a cardiac hx. No increased work of breathing noted at this time and skin warm and dry.

## 2018-05-11 ENCOUNTER — Ambulatory Visit
Admit: 2018-05-11 | Discharge: 2018-05-18 | Disposition: A | Discharge status: Home / Self Care | Payer: MEDICARE | Source: Other Acute Inpatient Hospital

## 2018-05-11 DIAGNOSIS — E1129 Type 2 diabetes mellitus with other diabetic kidney complication: Secondary | ICD-10-CM

## 2018-05-11 DIAGNOSIS — I214 Non-ST elevation (NSTEMI) myocardial infarction: Principal | ICD-10-CM

## 2018-05-11 DIAGNOSIS — R809 Proteinuria, unspecified: Secondary | ICD-10-CM

## 2018-05-11 DIAGNOSIS — N184 Chronic kidney disease, stage 4 (severe): Principal | ICD-10-CM

## 2018-05-11 DIAGNOSIS — E1321 Other specified diabetes mellitus with diabetic nephropathy: Secondary | ICD-10-CM

## 2018-05-11 LAB — CBC W/ AUTO DIFF
BASOPHILS ABSOLUTE COUNT: 0.1 10*9/L (ref 0.0–0.1)
BASOPHILS ABSOLUTE COUNT: 0.1 10*9/L (ref 0.0–0.1)
BASOPHILS RELATIVE PERCENT: 0.6 %
BASOPHILS RELATIVE PERCENT: 0.6 %
EOSINOPHILS ABSOLUTE COUNT: 0.3 10*9/L (ref 0.0–0.4)
EOSINOPHILS ABSOLUTE COUNT: 0.3 10*9/L (ref 0.0–0.4)
EOSINOPHILS RELATIVE PERCENT: 3.1 %
EOSINOPHILS RELATIVE PERCENT: 2.4 %
HEMATOCRIT: 33 % — ABNORMAL LOW (ref 41.0–53.0)
HEMOGLOBIN: 11.9 g/dL — ABNORMAL LOW (ref 13.5–17.5)
HEMOGLOBIN: 10.6 g/dL — ABNORMAL LOW (ref 13.5–17.5)
LARGE UNSTAINED CELLS: 2 % (ref 0–4)
LARGE UNSTAINED CELLS: 2 % (ref 0–4)
LYMPHOCYTES ABSOLUTE COUNT: 2.1 10*9/L (ref 1.5–5.0)
LYMPHOCYTES RELATIVE PERCENT: 21.6 %
LYMPHOCYTES RELATIVE PERCENT: 27.2 %
MEAN CORPUSCULAR HEMOGLOBIN CONC: 33.5 g/dL (ref 31.0–37.0)
MEAN CORPUSCULAR HEMOGLOBIN CONC: 32.1 g/dL (ref 31.0–37.0)
MEAN CORPUSCULAR HEMOGLOBIN: 28.1 pg (ref 26.0–34.0)
MEAN CORPUSCULAR HEMOGLOBIN: 27.6 pg (ref 26.0–34.0)
MEAN CORPUSCULAR VOLUME: 83.8 fL (ref 80.0–100.0)
MEAN CORPUSCULAR VOLUME: 86.1 fL (ref 80.0–100.0)
MEAN PLATELET VOLUME: 7.5 fL (ref 7.0–10.0)
MEAN PLATELET VOLUME: 8.7 fL (ref 7.0–10.0)
MONOCYTES ABSOLUTE COUNT: 0.5 10*9/L (ref 0.2–0.8)
MONOCYTES ABSOLUTE COUNT: 0.6 10*9/L (ref 0.2–0.8)
MONOCYTES RELATIVE PERCENT: 4.9 %
MONOCYTES RELATIVE PERCENT: 5 %
NEUTROPHILS ABSOLUTE COUNT: 6.4 10*9/L (ref 2.0–7.5)
NEUTROPHILS ABSOLUTE COUNT: 7.1 10*9/L (ref 2.0–7.5)
NEUTROPHILS RELATIVE PERCENT: 62.3 %
PLATELET COUNT: 250 10*9/L (ref 150–440)
PLATELET COUNT: 259 10*9/L (ref 150–440)
RED BLOOD CELL COUNT: 4.24 10*12/L — ABNORMAL LOW (ref 4.50–5.90)
RED CELL DISTRIBUTION WIDTH: 15.9 % — ABNORMAL HIGH (ref 12.0–15.0)
RED CELL DISTRIBUTION WIDTH: 15.7 % — ABNORMAL HIGH (ref 12.0–15.0)
WBC ADJUSTED: 11.3 10*9/L — ABNORMAL HIGH (ref 4.5–11.0)

## 2018-05-11 LAB — BASIC METABOLIC PANEL
BLOOD UREA NITROGEN: 38 mg/dL — ABNORMAL HIGH (ref 7–21)
CALCIUM: 8.5 mg/dL (ref 8.5–10.2)
CHLORIDE: 105 mmol/L (ref 98–107)
CO2: 17 mmol/L — ABNORMAL LOW (ref 22.0–30.0)
CREATININE: 3.05 mg/dL — ABNORMAL HIGH (ref 0.70–1.30)
EGFR CKD-EPI AA MALE: 26 mL/min/{1.73_m2} — ABNORMAL LOW (ref >=60)
EGFR CKD-EPI NON-AA MALE: 23 mL/min/{1.73_m2} — ABNORMAL LOW (ref >=60)
GLUCOSE RANDOM: 485 mg/dL (ref 70–179)
POTASSIUM: 5.4 mmol/L — ABNORMAL HIGH (ref 3.5–5.0)
SODIUM: 134 mmol/L — ABNORMAL LOW (ref 135–145)

## 2018-05-11 LAB — COMPREHENSIVE METABOLIC PANEL
ALBUMIN: 3.4 g/dL — ABNORMAL LOW (ref 3.5–5.0)
ALKALINE PHOSPHATASE: 94 U/L (ref 38–126)
ALT (SGPT): 11 U/L (ref <50)
ANION GAP: 11 mmol/L (ref 7–15)
BILIRUBIN TOTAL: 0.2 mg/dL (ref 0.0–1.2)
BLOOD UREA NITROGEN: 38 mg/dL — ABNORMAL HIGH (ref 7–21)
BUN / CREAT RATIO: 13
CALCIUM: 8.7 mg/dL (ref 8.5–10.2)
CHLORIDE: 109 mmol/L — ABNORMAL HIGH (ref 98–107)
CO2: 18 mmol/L — ABNORMAL LOW (ref 22.0–30.0)
CREATININE: 2.92 mg/dL — ABNORMAL HIGH (ref 0.70–1.30)
EGFR CKD-EPI AA MALE: 28 mL/min/{1.73_m2} — ABNORMAL LOW (ref >=60)
GLUCOSE RANDOM: 272 mg/dL — ABNORMAL HIGH (ref 65–179)
POTASSIUM: 5.2 mmol/L — ABNORMAL HIGH (ref 3.5–5.0)
PROTEIN TOTAL: 6.7 g/dL (ref 6.5–8.3)
SODIUM: 138 mmol/L (ref 135–145)

## 2018-05-11 LAB — URIC ACID: Urate:MCnc:Pt:Ser/Plas:Qn:: 8.8

## 2018-05-11 LAB — PROTIME: Lab: 10.9

## 2018-05-11 LAB — LIPID PANEL
CHOLESTEROL/HDL RATIO SCREEN: 8.5 — ABNORMAL HIGH (ref <5.0)
NON-HDL CHOLESTEROL: 202 mg/dL
TRIGLYCERIDES: 733 mg/dL — ABNORMAL HIGH (ref 1–149)

## 2018-05-11 LAB — TROPONIN I
Troponin I.cardiac:MCnc:Pt:Ser/Plas:Qn:: 0.15
Troponin I.cardiac:MCnc:Pt:Ser/Plas:Qn:: 0.95
Troponin I.cardiac:MCnc:Pt:Ser/Plas:Qn:: 1.09
Troponin I: 0.03 ng/mL (ref <0.03)

## 2018-05-11 LAB — HEMOGLOBIN: Lab: 11.9 — ABNORMAL LOW

## 2018-05-11 LAB — BUN / CREAT RATIO: Urea nitrogen/Creatinine:MRto:Pt:Ser/Plas:Qn:: 13

## 2018-05-11 LAB — NEUTROPHILS ABSOLUTE COUNT: Lab: 7.1

## 2018-05-11 LAB — PRO-BNP: Natriuretic peptide.B prohormone N-Terminal:MCnc:Pt:Ser/Plas:Qn:: 1670 — ABNORMAL HIGH

## 2018-05-11 LAB — APTT
APTT: 33.9 s (ref 25.9–39.5)
Coagulation surface induced:Time:Pt:PPP:Qn:Coag: 33.9

## 2018-05-11 LAB — CHOLESTEROL: Cholesterol:MCnc:Pt:Ser/Plas:Qn:: 229 — ABNORMAL HIGH

## 2018-05-11 LAB — HEPARIN CORRELATION: Lab: 1

## 2018-05-11 LAB — INR: Lab: 1.03

## 2018-05-11 LAB — CO2: Carbon dioxide:SCnc:Pt:Ser/Plas:Qn:: 17 — ABNORMAL LOW

## 2018-05-11 LAB — CK: Total CK: 44 U/L — ABNORMAL LOW (ref 49–397)

## 2018-05-11 LAB — SEDIMENTATION RATE: Sed Rate: 80 mm/hr — ABNORMAL HIGH (ref 0–15)

## 2018-05-11 NOTE — ED Notes (Signed)
Pts wife approached this nurse and stated that the patient was wanting to leave. This nurse informed her that the ED provider had recommended him for admission to the hospital. She then stated that she wanted him to stay as well but he was insisting on leaving. Dr Cinda Quest notified.

## 2018-05-11 NOTE — Discharge Instructions (Addendum)
I really wish you would stay in the hospital.  The fact that your EKG has changed twice since you have been here is worrisome.  Additionally your sedimentation rate is up.  That is a sign of inflammation.  The new medicine you are taking can cause inflammation and angiitis or inflammation of the blood vessels which could be part of the reason your EKG is changing.  I think it is dangerous for you to go home.  You could die.  But I cannot keep you in the hospital against her will.

## 2018-05-11 NOTE — Unmapped (Addendum)
We will check your blood tests today and let you know what medicine to change in regards to your blood pressure.    Good control of your blood pressure and diabetes are the most important things you can do to prevent the kidneys from getting worse. Keep working with your primary doctor on these issues.    Do not take NSAIDs.Patient Education        Medicines to Avoid With Kidney Disease: Care Instructions  Your Care Instructions    Kidney disease means that your kidneys are not able to get rid of waste from the blood. So they can't keep your body's fluids and chemicals in balance. Usually, the kidneys get rid of waste from the blood through the urine. And they balance the fluids in the body.  When your kidneys don't work as they should, you have to be careful about some medicines. They Tolson harm your kidneys. Your doctor [place] tell you not to take them. Or he or she [place] change the dose.  Medicines for pain and swelling, such as ibuprofen (Advil or Motrin) or naproxen (Aleve), can cause harm. So can some antibiotics and antacids. And you need to be careful about some drugs that treat cancer, lower blood pressure, or get rid of water from the body. Some herbal products could cause harm too.  Follow-up care is a key part of your treatment and safety. Be sure to make and go to all appointments, and call your doctor if you are having problems. It's also a good idea to know your test results and keep a list of the medicines you take.  How can you care for yourself at home?  ?? Tell your doctor all the prescription, herbal, or over-the-counter medicines you take. Do not take any new ones unless you talk to your doctor first.  ?? Do not take anti-inflammatory medicines. These include ibuprofen (Advil, Motrin) and naproxen (Aleve). You can use acetaminophen (Tylenol) for pain.  ?? Do not take two or more pain medicines at the same time unless the doctor told you to. Many pain medicines have acetaminophen, which is Tylenol. Too much acetaminophen (Tylenol) can be harmful.  ?? Tell all doctors and others who work with your health care that you have kidney disease.  ?? Wear medical alert jewelry that lists your health problem. You can buy this at most drugstores.  Where can you learn more?  Go to [facility] at https://myuncchart.org  [facility] under [employer]. Enter [phone] in the search box to learn more about Medicines to Avoid With Kidney Disease: Care Instructions.  Current as of: November 08, 2017  Content Version: 12.3  ?? 2006-2019 Healthwise, Inc[employer]. Care instructions adapted under license by [facility]. If you have questions about a medical condition or this instruction, always ask your healthcare professional. Healthwise, Incorporated disclaims any warranty or liability for your use of this information.

## 2018-05-11 NOTE — Unmapped (Signed)
Pt was at [facility] and signed out AMA. Pt had + trop and ECG changes.

## 2018-05-11 NOTE — Unmapped (Signed)
Report given to [facility], [facility] ED charge nurse. All questions answered.

## 2018-05-11 NOTE — Unmapped (Signed)
Outreach

## 2018-05-11 NOTE — Unmapped (Signed)
Care Management  Initial Transition Planning Assessment    Pt's wife reports that they [place] be homeless after eviction court tomorrow. SW provided wife with medical excuse letter in hopes that Mach provide some extra time since pt cannot assist with packing. Wife said they won't go to a shelter if they have to be separated. They Coba live out of their car if they can keep their car. They doubt the landlord will let them stay with their dtr who lives in neighboring property.    Patient has been identified as a Runner, broadcasting/film/video.  Requested that a  timely follow-up appointment be scheduled and documented prior to discharge.  Longitudinal Plan of Care reviewed, the following information is noted for continuity of care:              General  Care Manager assessed the patient by : In person interview with patient, In person interview with family, Medical record review  Orientation Level: Other (Comment)(difficult to assess - pt would fall asleep. SWCM assessed with pt's wife who was present.)  Who provides care at home?: Other (Comment)(Pt is independent with his ADLs at home, and he drives. If help is needed with ADLs, spouse assists.)  Reason for referral: Discharge Planning, Other - specify in comments(ACO safety screening)    Contact/Decision Maker  Primary Emergency Contact: Scallan,[name]  Mobile Phone: [phone]  Relation: Spouse  Secondary Emergency Contact: Jobson,Bridgette  Mobile Phone: [phone]  Relation: Daughter    Legal Next of Kin / Guardian / POA / Advance Directives   Advance Directive (Medical Treatment)  Does patient have an advance directive covering medical treatment?: Patient has advance directive covering medical treatment, copy in chart.  Information provided on advance directive:: No    Advance Directive (Mental Health Treatment)  Does patient have an advance directive covering mental health treatment?: Patient does not have advance directive covering mental health treatment. Reason patient does not have an advance directive covering mental health treatment:: Patient has a surrogate decision maker.    Patient Information  Lives with: Spouse/significant other    Type of Residence: To be determined(Wife states that they have eviction court tomorrow and they expect to be evicted. They say they Weist not be able to stay with their dtr since their dtr lives next door, under the same landlord. Additionally, she says she won't go to shelters if they have to be separated. She stated they Odell live in their car if they can keep their car.    Support Systems: Spouse, Children     Outpatient/Community Resources in place prior to admission: DSS, Clinic  Agency detail (Name/Phone #): Heart Track program at [facility]/[phone]? DSS of [facility] for food & nutrition services. PCP: Wilhemena Durie [phone]    Equipment Currently Used at Home: other (see comments)(Pt previously reported that he is not using any of the DME he has which includes: W/C, BSC, quad cane, rollator, shower chair, hospital bed, CPAP)  Current HME Agency (Name/Phone #): [phone]    Financial Information  Pt receives SSDI. Will need to spenddown in order to be eligible for Medicaid.  Wife has been unable to work or receive employment income since injury at work in January.    Need for financial assistance?: Yes  Type of financial assistance required: Community resources(Pt and wife Korff be evicted tomorrow without a plan or resources to acquire other housing)    Social Determinants of Health  Social Determinants of Health were addressed in provider  documentation.  Please refer to patient history.    Discharge Needs Assessment  Concerns to be Addressed: adjustment to diagnosis/illness, discharge planning, financial/insurance, home safety, compliance issue(Pt left ED at [facility] last night AMA)    Clinical Risk Factors: Principal Diagnosis: Cancer, Stroke, COPD, Heart Failure, AMI, Pneumonia, Joint Replacment, Multiple Diagnoses (Chronic), Functional Limitations    Barriers to taking medications: Yes (Comment)(Pt and wife are under significant financial stress)    Prior overnight hospital stay or ED visit in last 90 days: Yes    Readmission Within the Last 30 Days: no previous admission in last 30 days    Anticipated Changes Related to Illness: other (see comments)(TBD)    Equipment Needed After Discharge: other (see comments)(TBD)    Discharge Facility/Level of Care Needs: (TBD)    Readmission  Risk of Unplanned Readmission Score:  %  Predictive Model Details   No score data available for [facility] Risk of Unplanned Readmission     Readmitted Within the Last 30 Days? (No if blank)   Patient at risk for readmission?: Yes    Discharge Plan  Screen findings are: Discharge planning needs identified or anticipated (Comment).(housing is primary concern.)    Expected Discharge Date: (TBD)    Expected Transfer from Critical Care: (TBD)    Patient and/or family were provided with choice of facilities / services that are available and appropriate to meet post hospital care needs?: Other (Comment)(not yet. Pt still being assessed.)    Initial Assessment complete?: Yes      Home Safety Screening  Patient Questions:    1.  Before the illness or injury that brought you to the ED, did you need someone to help you on a regular basis? Do you need people to help you with activities of daily living?  ?? No  2.  In the last 24 hours, have you needed more help than usual?  ?? Yes -  3.  Have you been hospitalized for one or more nights during the past six months?   ?? Yes -    4.  Have you been to an emergency room one or more times in the past six months?  ?? Yes - Pt went to [facility] ED last night and left AMA  5.  In general, do you have problems with your vision that cannot be corrected with glasses/contacts?  ?? No  6.  In general, do you have problems with your memory?  ?? No  7.  Do you take six or more different medications every day?   ?? Yes - 8. Do you have any trouble getting, taking, keeping up with or paying for your medications?   ?? Yes - they Swinger lose their housing d/t financial challenges  9   Have you had any falls over the past few weeks or are you concerned about falling?  ?? No  10.  Are you having trouble accessing and/or preparing the meals you need?  ?? No  11. Do you have wounds or bed ulcers?  ?? No  12  Do you have access to transportation?    Yes -      Care Manager Questions:  Is the patient over the age of 81?  ?? No  *Home Safety Evaluation endorsed  5 of the screening items and   home impact assessment should be considered and will update personal health advocate    Personal Health Advocate Pool contacted by In Basket message regarding post-acute services and collaboration around plan  of care.      Leafy Ro, Alexander Mt, Inpatient Case Manager, May 11, 2018 4:51 PM

## 2018-05-11 NOTE — Unmapped (Signed)
[facility] Cardiology Transfer Center Request Note    2:55 PM  05/11/18    Requesting Physician: Dr. Ellouise Newer  Requesting [facility]: [facility]  Requesting Service: ED    Reason for Transfer: Possible NSTEMI    Brief Hospital Course: Jeremy Oliver is a 48 year old male with extensive cardiovascular history including prior STEMI, VT arrest with ICD, ischemic cardiomyopathy, extensive CAD s/p multiple PCIs most recently in 02/2018 with DES to RCA, hypertension, CKD, morbid obesity in ED with NSTEMI.  Left AMA from [facility] yesterday after being diagnosed with NSTEMI but presented to [facility] at advice of family members.  Troponin 0.15 with ongoing chest pain.  EKG with mild ST depression in V6.  He is otherwise hemodynamically stable (does have ongoing hypertension).  They report giving ASA 325mg  and fentanyl but the provider has not yet followed back with the patient to determine if he is chest pain free.    I advised to follow with the patient to see if chest pain now resolve.  Recommended treatment with nitroglycerin sublingual or nitro paste to help with HTN and chest pain.  We are happy to accept the patient to the cardiology service however unfortunately there is no availability of beds currently.  Given his history it Cronce be appropriate for ER to ER transfer in order for the patient to be at a facility that has available cath lab.  If deemed inappropriate for ER to ER transfer we will accept the patient to cardiology service pending bed availability.  He should be medically treated with his possible NSTEMI with blood pressure control, ASA, heparin in the meantime.    BP 167/85  - Pulse 105  - Temp 35.9 ??C  - Resp 24  - SpO2 97%       Plan Upon Arrival:   - Trend troponin  - heparin gtt  - NPO for possible LHC    Given bed availability it [place] be preferable for ER to ER transfer.  If deemed inappropraite we will accept to cardiology once bed is available    Accepting Service: Med C  Bed Type: Floor  Attending: Devoria Albe, MD  05/11/18  14:57

## 2018-05-11 NOTE — Unmapped (Signed)
CCM Follow-up Call      PCP Action:None     CCM Action :f/u [facility] ER , called wife back to confirm pt going to ED now    Patient Action:call EMS if s/s CP, SOB increase. Get back to an ER stat re: elevated troponin and abnormal EKG results.   Calls back and forth: poor cellular/mobile services.    Wife [name] calls CCM re: I'm giving up, [name] was at [facility] last night and had EKG changes (he never has that!) and elevated enzymes.  Wife states pt left AMA b/c going to eviction court tomorrow and pt states needs to help his family, however pt unable to do anything but lay down to keep CP from increasing.   Current CP is left scapular pain radiating through chest, pt states, but it's not that bad. Re-iterated pt having active CP and to return to ED.    CCM and PCP/Dr. Hulan Fess spoke to pt on speaker phone re: urgency of situation and need to return to ER.     Pt confirms understanding that active chest pain trumps eviction notices re: imminent death possible. Pt agrees to go back to ED, wants to be in [facility] system.  Pt sounds positive and somewhat apathetic at the same time. Wife thankful pt has decided to return to ED to seek medical follow up.    CCM spoke to [facility] @ ED in [facility]. Brittney aware pt will be coming to ED this afternoon.     Current status- wife driving pt to [facility] ED in their car.

## 2018-05-11 NOTE — Unmapped (Signed)
Pt. Has had chest pain x 2-3 days; was at [facility] yesterday with +EKG changes and +troponin, but signed out AMA; seen at PCP this morning and sent here for further work up

## 2018-05-11 NOTE — Unmapped (Signed)
Emergency Medicine Access Physician [facility])  [facility] Patient Logistics Center - Transfer Request Note    Requesting Provider: Dr. Ellouise Newer    Requesting [facility]: [facility] ED    Requesting Service: Emergency Medicine    Reason for transfer: NSTEMI, need for cardiology services    Overview of ED course at transferring hospital: Jeremy Oliver is a 48 y.o. male seen yesterday in the [facility] emergency department for chest pain.  He is followed by [facility] cardiology with his most recent cardiac catheterization being on 03/12/2018 here showing severe mid RCA and distal RCA bifurcation stenosis with successful PCI at that time.  His troponin yesterday was 0.03 (normal on their assay is < 0.03).  It was recommended the patient be admitted for further evaluation and management but he declined and left AGAINST MEDICAL ADVICE.  He presented to the [facility] ED today with persistent pain at the insistence of his wife.  His troponin today is 0.15 (upper limit normal [place] troponin assay 0.06).  He has been given aspirin, nitroglycerin and fentanyl but has continued chest pain.  Last VS: AF, 94 to 97% on room air, 157/87, 86, 14.  Heparin has not been started. The referring provider discussed the case with [facility] cardiology who requested ED-to-ED transfer for their evaluation and admission as there are currently on heart and vascular floor beds available for direct admission from HB.    Was the patient accepted to the [facility] ED in transfer: Yes.  If no, why?: N/A    Accepting service/expected service information:  Has an inpatient or consulting service been contacted by the [facility] or the referring provider?: Yes.  If admitting/consult service contacted by [facility]:  Time of discussion: Contacted through the [facility] prior to [facility] involement  Name/service (e.g. Dr. Floyde Parkins, neprology) of inpatient team member/consultant: Cardiology  Role of admit/consult service member: Fellow  Brief summary of call: Documented above in overview section  Anticipated Inpatient Bed Type Needed: To be determined    Outside Hospital Imaging:  Was Imaging Done at the Referring Hospital:  Yes.  If yes, what studies?: CXR  [employer]:  [employer], images available in [employer]

## 2018-05-11 NOTE — Unmapped (Signed)
Addendum;  Jeremy Oliver has called pt has arrived to [facility] ED

## 2018-05-11 NOTE — Unmapped (Signed)
[facility] Department Provider Note      ED Course, Assessment and Plan     Initial Clinical Impression:    May 11, 2018 12:48 PM   Jeremy Route Riding is a 48 y.o. male with a PMH of atrial fibrillation, CKD stage 3, CAD, DM, angina at rest, HLD, GERD, HTN, CHF with 40-50% EF, and NSTEMI presenting with 3 days of chest pain as described below. On exam, patient is well appearing and in NAD. Vitals notable for hypertension to 204/106 but otherwise WNL.    BP 204/106  - Temp 35.9 ??C (96.6 ??F)  - SpO2 94%     Differential diagnosis includes acs, NSTEMI    Will obtain CXR, ekg, basic labs, troponin and coags. Will treat patient with aspirin and fentanyl.     ED Course:  Patient admitted.  Discussed case with cardiology.  Will start heparin drip.  We will continue nitro.    _____________________________________________________________________    The case was discussed with attending physician who is in agreement with the above assessment and plan    Dictation software was used while making this note. Please excuse any errors made with dictation software.    Additional Medical Decision Making     I have reviewed the vital signs and the nursing notes. Labs and radiology results that were available during my care of the patient were independently reviewed by me and considered in my medical decision making.     I independently visualized the EKG tracing if performed  I independently visualized the radiology images if performed  I reviewed the patient's prior medical records if available.  Additional history obtained from family if available    History     CHIEF COMPLAINT:   Chief Complaint   Patient presents with   ??? Chest Pain       HPI: Jeremy Oliver is a 48 y.o. male with a PMH of atrial fibrillation (on Xarelto), CKD stage 3, CAD, T2DM, angina at rest, HLD, GERD, HTN, CHF with 40-50% EF and NSTEMI (03/10/2018) s/p DES placement presenting with 3 days of sharp chest pain and associated SOB. He states the pain radiates to his back and left chest. His pain is worsened with movement of his left arm, palpation of his chest, or exertion. He reports this pain is different from his previous heart attacks. He endorses intermittent nausea and vomiting over the last few days. Patient was [facility] yesterday with positive EKG changes and positive troponin, although he left AMA because he doesn't like being in [employer]. He was seen this morning by his PCP and sent to ED for further workup. He has not taken anything for his symptoms today. Patient has not taken his blood pressure medications this morning. He denies any recent falls or trauma. Patient is anticoagulated. Denies diaphoresis.     PAST MEDICAL HISTORY/PAST SURGICAL HISTORY:   Past Medical History:   Diagnosis Date   ??? Angina at rest (CMS-[name])    ??? Arthritis    ??? Asthma    ??? Atrial fibrillation and flutter (CMS-[name]) 06/08/14   ??? BMI 40.0-44.9, adult (CMS-[name]) 10/29/2015   ??? Chronic anticoagulation 02/17/2018   ??? Chronic kidney disease, stage 3 (CMS-[name])    ??? Chronic pain syndrome 04/11/2014    ~Use daily lyrica as recommended ~Tramadol as needed for pain ~Fluoxetine daily as recommended ~Daily exercise ~Eat a healthy diet ~Good control of your blood sugars can help    ??? Coronary  artery disease    ??? Depression    ??? Diabetes mellitus (CMS-[name])    ??? Eye trauma 1998    glass in both eyes and removed   ??? GERD (gastroesophageal reflux disease)    ??? Gout    ??? Homeless    ??? Hyperlipidemia    ??? Hypertension    ??? Illiteracy and low-level literacy    ??? Microscopic hematuria    ??? Migraine    ??? Nephrolithiasis    ??? 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 Nonalcoholic fatty liver disease    ??? NSTEMI (non-ST elevated myocardial infarction) (CMS-[name]) 01/13/2017   ??? Obesity    ??? Obstructive sleep apnea 2009   ??? Panic attacks    ??? 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 Seizure-like activity (CMS-[name])     ~Monitor for symptoms and report them to your primary care provider if they occur    ??? Systolic CHF, chronic (CMS-[name])     EF 40-45% 2017   ??? TIA (transient ischemic attack)        Past Surgical History:   Procedure Laterality Date   ??? CARDIAC CATHETERIZATION  03/08/2007   ??? CYSTOURETHROSCOPY  12/31/2009     Microscopic hematuria   ??? KNEE SURGERY Right 09/23/2006    arthroscopic knee surgery medial and lateral meniscus tears as well as crystal disease   ??? PR CATH PLACE/CORON ANGIO, IMG SUPER/INTERP,R&L HRT CATH, L HRT VENTRIC N/A 02/13/2017    Procedure: Left/Right Heart Catheterization;  Surgeon: Marlaine Hind, MD;  Location: [facility] CATH;  Service: Cardiology   ??? PR CATH PLACE/CORON ANGIO, IMG SUPER/INTERP,W LEFT HEART VENTRICULOGRAPHY N/A 03/17/2017    Procedure: Left Heart Catheterization W Intervention;  Surgeon: Marlaine Hind, MD;  Location: [facility] CATH;  Service: Cardiology   ??? PR ECMO/ECLS INITIATION VENO-ARTERIAL N/A 03/19/2017    Procedure: ECMO/ECLS; INITIATION, VENO-ARTERIAL;  Surgeon: Lennie Odor, MD;  Location: MAIN OR [facility];  Service: Cardiac Surgery   ??? PR ECMO/ECLS RMVL PRPH CANNULA OPEN 6 YRS & OLDER Left 03/25/2017    Procedure: ECMO / ECLS PROVIDED BY PHYSICIAN; REMOVAL OF PERIPHERAL (ARTERIAL AND/OR VENOUS) CANNULA(E), OPEN, 6 YEARS AND OLDER;  Surgeon: Alonna Buckler Ikonomidis, MD;  Location: MAIN OR [facility];  Service: Cardiac Surgery   ??? PR EPHYS EVAL W/ ABLATION SUPRAVENT ARRHYTHMIA N/A 07/19/2015    Catheter ablation of cavotricuspid isthmus for atrial flutter [facility])   ??? PR INSER HART PACER XVENOUS ATRIAL N/A 05/30/2015    [employer] dual-chamber pacemaker implant (E.Chung)   ??? PR INSERT/PLACE FLOW DIRECT CATH N/A 03/17/2017    Procedure: Insert Leave In [place];  Surgeon: Marlaine Hind, MD;  Location: [facility] CATH;  Service: Cardiology   ??? PR INSJ/RPLCMT PERM DFB W/TRNSVNS LDS 1/DUAL CHMBR N/A 05/04/2017    Procedure: ICD Implant System (Single/Dual);  Surgeon: Eldred Manges, MD;  Location: [facility] CATH;  Service: Cardiology   ??? PR NEGATIVE PRESSURE WOUND THERAPY DME >50 SQ CM Left 03/25/2017    Procedure: Neg Press Wound Tx (Vac Assist) Incl Topicals, Per Session, Tsa Greater Than/=  50 Cm Squared;  Surgeon: Alonna Buckler Ikonomidis, MD;  Location: MAIN OR [facility];  Service: Cardiac Surgery   ??? PR PRQ TRLUML CORONARY STENT W/ANGIO ONE ART/BRNCH N/A 03/12/2018    Procedure: Percutaneous Coronary Intervention;  Surgeon: Marlaine Hind, MD;  Location: [facility] CATH;  Service: Cardiology   ??? PR REBL VES DIRECT,LOW EXTREM Left 03/19/2017    Procedure: Repr Bld Vessel Direct; Lower Extrem;  Surgeon: Boykin Reaper, MD;  Location: MAIN OR [facility];  Service: Vascular   ??? PR REMV ART CLOT ILIAC-POP,LEG INCIS Left 03/25/2017    Procedure: EMBOLECTOMY OR THROMBECTOMY, WITH OR WITHOUT CATHETER; FEMOROPOPLITEAL, AORTOILIAC ARTERY, BY LEG INCISION;  Surgeon: Alonna Buckler Ikonomidis, MD;  Location: MAIN OR [facility];  Service: Cardiac Surgery   ??? PR UPPER GI ENDOSCOPY,BIOPSY N/A 02/28/2016    Procedure: UGI ENDOSCOPY; WITH BIOPSY, SINGLE OR MULTIPLE;  Surgeon: Liane Comber, MD;  Location: [facility];  Service: Gastroenterology   ??? PR UPPER GI ENDOSCOPY,DIAGNOSIS N/A 05/07/2016    Procedure: UGI ENDO, INCLUDE ESOPHAGUS, STOMACH, & DUODENUM &/OR JEJUNUM; DX W/WO COLLECTION SPECIMN, BY BRUSH OR WASH;  Surgeon: Modena Nunnery, MD;  Location: [facility];  Service: Gastroenterology       MEDICATIONS:     Current Facility-Administered Medications:   ???  aspirin chewable tablet 324 mg, 324 mg, Oral, Once, Gilman Schmidt, MD    Current Outpatient Medications:   ???  acetaminophen (TYLENOL) 500 MG tablet, TAKE 2 TABLETS (1 000MG ) BY MOUTH EVERY 8 HOURS AS NEEDED FOR PAIN, Disp: 180 each, Rfl: 3  ???  albuterol HFA 90 mcg/actuation inhaler, Inhale 2 puffs into the lungs every 6 (six) hours as needed for wheezing or shortness of breath., Disp: 18 g, Rfl: 2  ???  alirocumab (PRALUENT PEN) 75 mg/mL PnIj, INJECT THE CONTENTS OF 1 PEN (75 MG) UNDER THE SKIN EVERY 14 DAYS, Disp: 2 mL, Rfl: 11  ???  allopurinol (ZYLOPRIM) 100 MG tablet, Take 0.5 tablets (50 mg total) by mouth every other day., Disp: 30 tablet, Rfl: 11  ???  amiodarone (PACERONE) 200 MG tablet, TAKE 1 TABLET BY MOUTH ONCE DAILY, Disp: 90 tablet, Rfl: 3  ???  amLODIPine (NORVASC) 10 MG tablet, Take 1 tablet (10 mg total) by mouth daily., Disp: 90 tablet, Rfl: PRN  ???  atorvastatin (LIPITOR) 80 MG tablet, Take 1 tablet (80 mg total) by mouth daily., Disp: 90 tablet, Rfl: 3  ???  blood sugar diagnostic Strp, USE TO CHECK BLOOD SUGAR 4 TIMES DAILY (BEFORE MEALS AND NIGHTLY), Disp: 100 each, Rfl: 3  ???  blood-glucose meter kit, Use to check blood sugars, #1 meter, #100 strips. Freestyle glucose meter. ICD-9 250.00, Disp: 1 each, Rfl: 0  ???  blood-glucose sensor (FREESTYLE NAVIGATOR GLUC SENS) Devi, Frequency:BID   Dosage:0.0     Instructions:  Note:Dose: 1, Disp: , Rfl:   ???  bumetanide (BUMEX) 2 MG tablet, TAKE 1 TABLET BY MOUTH ONCE DAILY, Disp: 90 tablet, Rfl: 3  ???  carvedilol (COREG) 25 MG tablet, TAKE 1 TABLET BY MOUTH WITH THE MORNING MEAL AND TAKE 2 TABLETS WITH THE EVENING MEAL, Disp: 270 tablet, Rfl: 3  ???  cholecalciferol, vitamin D3, 5,000 unit capsule, TAKE 1 CAPSULE (5 000 UNITS) BY MOUTH DAILY, Disp: 100 capsule, Rfl: 0  ???  diclofenac sodium (VOLTAREN) 1 % gel, APPLY 2 GRAMS TOPICALLY THREE TIMES DAILY, Disp: 900 g, Rfl: 5  ???  empty container Misc, Use as directed to dispose of  injectable medications, Disp: 1 each, Rfl: 3  ???  ezetimibe (ZETIA) 10 mg tablet, TAKE 1 TABLET BY MOUTH ONCE DAILY, Disp: 90 each, Rfl: 3  ???  FLUoxetine (PROZAC) 20 MG capsule, TAKE 3 CAPSULES (60MG ) BY MOUTH DAILY, Disp: 270 capsule, Rfl: 3  ???  gabapentin (NEURONTIN) 300 MG capsule, Take 2 capsules (600 mg total) by mouth nightly., Disp: 180 capsule, Rfl: 3  ???  hydrALAZINE (APRESOLINE) 25 MG tablet, Take 1 tablet (25 mg total) by mouth Three (3) times a day., Disp: 270 tablet, Rfl: 3  ???  insulin ASPART (NOVOLOG FLEXPEN U-100 INSULIN) 100 unit/mL (3 mL) injection pen, Inject 0.15 mL (15 Units total) under the skin Three (3) times a day with a meal., Disp: 15 mL, Rfl: 11  ???  insulin detemir U-100 (LEVEMIR FLEXTOUCH U-100 INSULN) 100 unit/mL (3 mL) injection pen, Inject 0.4 mL (40 Units total) under the skin two (2) times a day., Disp: 15 mL, Rfl: 11  ???  insulin syringe-needle U-100 0.3 mL 31 gauge x 5/16 Syrg, USE TO INJECT INSULIN TWICE DAILY WITH MEALS, Disp: 60 each, Rfl: 0  ???  isosorbide mononitrate (IMDUR) 30 MG 24 hr tablet, Take 1 tablet (30 mg total) by mouth daily., Disp: 90 tablet, Rfl: 3  ???  lancets 30 gauge Misc, USE TO CHECK BLOOD SUGAR THREE TIMES DAILY WITH MEALS, Disp: 250 each, Rfl: 2  ???  losartan (COZAAR) 50 MG tablet, Take 1 tablet (50 mg total) by mouth daily., Disp: 90 tablet, Rfl: 3  ???  melatonin 3 mg cap, Take 9 mg by mouth nightly., Disp: , Rfl:   ???  nitroglycerin (NITROSTAT) 0.4 MG SL tablet, Place 1 tablet (0.4 mg total) under the tongue every five (5) minutes as needed for chest pain., Disp: 100 each, Rfl: 3  ???  QUEtiapine (SEROQUEL) 50 MG tablet, Take 1 tablet (50 mg total) by mouth two (2) times a day as needed (headache)., Disp: 180 tablet, Rfl: 3  ???  rivaroxaban (XARELTO) 15 mg Tab, Take 1 tablet (15 mg total) by mouth daily with evening meal., Disp: 90 tablet, Rfl: 3  ???  semaglutide 0.25 mg or 0.5 mg(2 mg/1.5 mL) PnIj, Inject 0.25 mg under the skin every seven (7) days. For 4 weeks then increase to 0.5mg  every 7 days, Disp: 7.5 mL, Rfl: 0  ???  sevelamer (RENVELA) 800 mg tablet, TAKE 2 TABLETS (1600MG ) BY MOUTH THREE TIMES DAILY WITH MEALS, Disp: 540 tablet, Rfl: 3  ???  ticagrelor (BRILINTA) 90 mg Tab, TAKE 1 TABLET BY MOUTH TWICE DAILY, Disp: 180 tablet, Rfl: 3  ???  traMADol (ULTRAM) 50 mg tablet, Take 1 tablet (50 mg total) by mouth every six (6) hours as needed., Disp: 30 tablet, Rfl: 0  ???  UNIFINE PENTIPS 31 gauge x 5/16 Ndle, USE WITH INSULIN AND VICTOZA 5 TIMES DAILY, Disp: 400 each, Rfl: 3    ALLERGIES:   Morphine    SOCIAL HISTORY:   Social History     Tobacco Use   ??? Smoking status: Never Smoker   ??? Smokeless tobacco: Current User     Types: Chew   ??? Tobacco comment: Dips 1 can/week   Substance Use Topics   ??? Alcohol use: Yes     Alcohol/week: 0.0 standard drinks     Comment: rare use: couple times a year, small amount       FAMILY HISTORY:  Family History   Problem Relation Age of Onset   ??? Diabetes  Mother    ??? Hyperlipidemia Mother    ??? Heart disease Mother    ??? Basal cell carcinoma Mother    ??? Blindness Mother         diabeties   ??? Obesity Mother    ??? Hyperlipidemia Father    ??? Heart disease Father    ??? Lung disease Father    ??? Heart disease Half-Sister    ??? Kidney disease Half-Sister    ??? Gallbladder disease Half-Brother    ??? Obesity Half-Brother    ??? No Known Problems Daughter    ??? No Known Problems Son    ??? Obesity Daughter    ??? Melanoma Neg Hx    ??? Squamous cell carcinoma Neg Hx    ??? Macular degeneration Neg Hx    ??? Glaucoma Neg Hx         Review of Systems    A 10 point review of systems was performed and is negative other than positive elements noted in HPI   Constitutional: Negative for fever.  Eyes: Negative for visual changes.  ENT: Negative for sore throat.  Cardiovascular: Positive for chest pain.  Respiratory: Positive for shortness of breath.  Gastrointestinal: Positive for nausea and vomiting. Negative for abdominal pain or diarrhea.  Genitourinary: Negative for dysuria.  Musculoskeletal: Negative for back pain.  Skin: Negative for rash.  Neurological: Negative for headaches, focal weakness or numbness.    Physical Exam     VITAL SIGNS:    BP 204/106  - Temp 35.9 ??C (96.6 ??F)  - SpO2 94%     Constitutional: Alert and oriented. Well appearing and in no distress.  Eyes: Conjunctivae are normal.  ENT       Head: Normocephalic and atraumatic.       Nose: No congestion.       Mouth/Throat: Mucous membranes are moist.       Neck: No stridor.  Hematological/Lymphatic/Immunilogical: No cervical lymphadenopathy.  Cardiovascular: S1, S2,  Normal and symmetric distal pulses are present in all extremities.Warm and well perfused.  Respiratory: Normal respiratory effort. Breath sounds are normal.  Gastrointestinal: Soft and nontender. There is no CVA tenderness.  Musculoskeletal: Normal range of motion in all extremities.       Right lower leg: No tenderness or edema.       Left lower leg: No tenderness or edema.  Neurologic: Normal speech and language. No gross focal neurologic deficits are appreciated.  Skin: Skin is warm, dry and intact. No rash noted.  Psychiatric: Mood and affect are normal. Speech and behavior are normal.    Radiology     XR Chest 2 views    (Results Pending)     No results found.    Labs     EKG:     Pertinent labs & imaging results that were available during my care of the patient were reviewed by me and considered in my medical decision making (see chart for details).    Please note- This chart has been created using [employer]. Chart creation errors have been sought, but [place] not always be located and such creation errors, especially pronoun confusion, do NOT reflect on the standard of medical care.    ______________________________________________________   Documentation assistance was provided by Lorie Apley and Rutha Bouchard, Scribes, on May 11, 2018 at 12:51 PM for Mina Marble, MD    Documentation assistance was provided by the scribe in my presence.  The documentation recorded by the scribe has been  reviewed by me and accurately reflects the services I personally performed.         Carole Binning, MD  Resident  05/11/18 [phone]

## 2018-05-11 NOTE — Unmapped (Addendum)
DIVISION OF NEPHROLOGY AND HYPERTENSION  [facility]  [address]  [place], [place] [address]         Date of Service: 05/11/2018      PCP: Referring Provider:   Kurtis Bushman, MD  [address] [place] [address]  [place] [address]  Phone: [phone]  Fax: [phone] Roma Schanz, MD  [address]  [place] [address]  [place], [place] [address]  Phone: [phone]  Fax: [phone]       05/11/2018    Chief Complaint: fu AKI on CKD    HPI:  [name] presents for consultation after recent hospitalization in Dec with NSTEMI s/p placement of DES. He was also hospitalized and in ICU from 2018-2019 when he experienced AKI requiring 5 sessions of dialysis which is when I met him. 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 He also has a h/o naproxen for years for gout flare but he d/c'd this after the hospitalization a year ago. Today, he reports left shoulder pain for which he does not remember an particular trauma or injury. It is worse with exertion. He was at [facility] last night, and they recommended admission, but he refused.    He states that he urinates a lot.    He drinks sodas, in particular dark colas and ginger ales.    He did not take his BP meds yet this AM.    ROS:   CONSTITUTIONAL: denies fevers or chills, denies unintentional weight loss  CARDIOVASCULAR: endorses intermittent chest pain as per HPI, endorses dyspnea on exertion, endorses leg edema - chronic  GASTROINTESTINAL: denies nausea, denies vomiting, denies anorexia  GENITOURINARY: denies dysuria, denies hematuria, denies decreased urinary stream  All other systems reviewed and are negative except as listed above.    PAST MEDICAL HISTORY:  Past Medical History:   Diagnosis Date   ??? Angina at rest (CMS-[name])    ??? Arthritis    ??? Asthma    ??? Atrial fibrillation and flutter (CMS-[name]) 06/08/14   ??? BMI 40.0-44.9, adult (CMS-[name]) 10/29/2015   ??? Chronic anticoagulation 02/17/2018   ??? Chronic kidney disease, stage 3 (CMS-[name])    ??? Chronic pain syndrome 04/11/2014    ~Use daily lyrica as recommended ~Tramadol as needed for pain ~Fluoxetine daily as recommended ~Daily exercise ~Eat a healthy diet ~Good control of your blood sugars can help    ??? Coronary artery disease    ??? Depression    ??? Diabetes mellitus (CMS-[name])    ??? Eye trauma 1998    glass in both eyes and removed   ??? GERD (gastroesophageal reflux disease)    ??? Gout    ??? Homeless    ??? Hyperlipidemia    ??? Hypertension    ??? Illiteracy and low-level literacy    ??? Microscopic hematuria    ??? Migraine    ??? Nephrolithiasis    ??? 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 Nonalcoholic fatty liver disease    ??? NSTEMI (non-ST elevated myocardial infarction) (CMS-[name]) 01/13/2017   ??? Obesity    ??? Obstructive sleep apnea 2009   ??? Panic attacks    ??? 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 Seizure-like activity (CMS-[name])     ~Monitor for symptoms and report them to your primary care provider if they occur    ??? Systolic CHF, chronic (CMS-[name])     EF 40-45% 2017   ??? TIA (transient ischemic attack)        PAST SURGICAL HISTORY:  Past Surgical History:   Procedure Laterality Date   ??? CARDIAC CATHETERIZATION  03/08/2007   ??? CYSTOURETHROSCOPY  12/31/2009     Microscopic hematuria   ??? KNEE SURGERY Right 09/23/2006    arthroscopic knee surgery medial and lateral meniscus tears as well as crystal disease   ??? PR CATH PLACE/CORON ANGIO, IMG SUPER/INTERP,R&L HRT CATH, L HRT VENTRIC N/A 02/13/2017    Procedure: Left/Right Heart Catheterization;  Surgeon: Marlaine Hind, MD;  Location: [facility] CATH;  Service: Cardiology   ??? PR CATH PLACE/CORON ANGIO, IMG SUPER/INTERP,W LEFT HEART VENTRICULOGRAPHY N/A 03/17/2017    Procedure: Left Heart Catheterization W Intervention;  Surgeon: Marlaine Hind, MD;  Location: [facility] CATH;  Service: Cardiology   ??? PR ECMO/ECLS INITIATION VENO-ARTERIAL N/A 03/19/2017    Procedure: ECMO/ECLS; INITIATION, VENO-ARTERIAL;  Surgeon: Lennie Odor, MD;  Location: MAIN OR [facility];  Service: Cardiac Surgery   ??? PR ECMO/ECLS RMVL PRPH CANNULA OPEN 6 YRS & OLDER Left 03/25/2017    Procedure: ECMO / ECLS PROVIDED BY PHYSICIAN; REMOVAL OF PERIPHERAL (ARTERIAL AND/OR VENOUS) CANNULA(E), OPEN, 6 YEARS AND OLDER;  Surgeon: Alonna Buckler Ikonomidis, MD;  Location: MAIN OR [facility];  Service: Cardiac Surgery   ??? PR EPHYS EVAL W/ ABLATION SUPRAVENT ARRHYTHMIA N/A 07/19/2015    Catheter ablation of cavotricuspid isthmus for atrial flutter [facility])   ??? PR INSER HART PACER XVENOUS ATRIAL N/A 05/30/2015    [employer] dual-chamber pacemaker implant (E.Chung)   ??? PR INSERT/PLACE FLOW DIRECT CATH N/A 03/17/2017    Procedure: Insert Leave In [employer];  Surgeon: Marlaine Hind, MD;  Location: [facility] CATH;  Service: Cardiology   ??? PR INSJ/RPLCMT PERM DFB W/TRNSVNS LDS 1/DUAL CHMBR N/A 05/04/2017    Procedure: ICD Implant System (Single/Dual);  Surgeon: Eldred Manges, MD;  Location: [facility] CATH;  Service: Cardiology   ??? PR NEGATIVE PRESSURE WOUND THERAPY DME >50 SQ CM Left 03/25/2017    Procedure: Neg Press Wound Tx (Vac Assist) Incl Topicals, Per Session, Tsa Greater Than/= 50 Cm Squared;  Surgeon: Alonna Buckler Ikonomidis, MD;  Location: MAIN OR [facility];  Service: Cardiac Surgery   ??? PR PRQ TRLUML CORONARY STENT W/ANGIO ONE ART/BRNCH N/A 03/12/2018    Procedure: Percutaneous Coronary Intervention;  Surgeon: Marlaine Hind, MD; Location: [facility] CATH;  Service: Cardiology   ??? PR REBL VES DIRECT,LOW EXTREM Left 03/19/2017    Procedure: Repr Bld Vessel Direct; Lower Extrem;  Surgeon: Boykin Reaper, MD;  Location: MAIN OR [facility];  Service: Vascular   ??? PR REMV ART CLOT ILIAC-POP,LEG INCIS Left 03/25/2017    Procedure: EMBOLECTOMY OR THROMBECTOMY, WITH OR WITHOUT CATHETER; FEMOROPOPLITEAL, AORTOILIAC ARTERY, BY LEG INCISION;  Surgeon: Alonna Buckler Ikonomidis, MD;  Location: MAIN OR [facility];  Service: Cardiac Surgery   ??? PR UPPER GI ENDOSCOPY,BIOPSY N/A 02/28/2016    Procedure: UGI ENDOSCOPY; WITH BIOPSY, SINGLE OR MULTIPLE;  Surgeon: Liane Comber, MD;  [facility];  Service: Gastroenterology   ??? PR UPPER GI ENDOSCOPY,DIAGNOSIS N/A 05/07/2016    Procedure: UGI ENDO, INCLUDE ESOPHAGUS, STOMACH, & DUODENUM &/OR JEJUNUM; DX W/WO COLLECTION SPECIMN, BY BRUSH OR WASH;  Surgeon: Modena Nunnery, MD;  [facility]: [facility];  Service: Gastroenterology       SOCIAL HISTORY:  Social History     Socioeconomic History   ??? Marital status: Married     Spouse name: [name]   ??? Number of children: 3   ??? Years of education: 89   ??? Highest education level: Not on file   Occupational History     Comment: Former IT sales professional   Social Needs   ??? Financial resource strain: Very hard   ??? Food insecurity     Worry: Not on file     Inability: Not on file   ??? Transportation needs     Medical: Not on file     Non-medical: Not on file   Tobacco Use   ??? Smoking status: Never Smoker   ??? Smokeless tobacco: Current User     Types: Chew   ??? Tobacco comment: Dips 1 can/week   Substance and Sexual Activity   ??? Alcohol use: Yes     Alcohol/week: 0.0 standard drinks     Comment: rare use: couple times a year, small amount   ??? Drug use: No   ??? Sexual activity: Yes     Partners: Female   Lifestyle   ??? Physical activity     Days per week: Not on file     Minutes per session: Not on file   ??? Stress: Not on file   Relationships   ??? Social Wellsite geologist on phone: More than three times a week     Gets together: More than three times a week     Attends religious service: 1 to 4 times per year     Active member of club or organization: No     Attends meetings of clubs or organizations: Never     Relationship status: Married   Other Topics Concern   ??? Do you use sunscreen? No   ??? Tanning bed use? No   ??? Are you easily burned? No   ??? Excessive sun exposure? No   ??? Blistering sunburns? No   Social History Narrative    Information updated:  01/08/16. Reviewed 08/12/17:        Born in [place]    Raised with both parents    Parents died w/in 6 mos of each other. His parents knew they were going to pass and told him just before.    Half sister and 2 half brothers        Married 30 yrs. Wife [name]    2 other children son and daughter, adopted out    Daughter Bridgette, bf DJ        Education - completed through 11th. 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    Former Presenter, broadcasting since age 5        Lives with wife, difficulty paying bills 12/30/17     Lives next door to daughter, husband and their grandson  FAMILY HISTORY:  Unchanged from previous visit.  Family History   Problem Relation Age of Onset   ??? Diabetes Mother    ??? Hyperlipidemia Mother    ??? Heart disease Mother    ??? Basal cell carcinoma Mother    ??? Blindness Mother         diabeties   ??? Obesity Mother    ??? Hyperlipidemia Father    ??? Heart disease Father    ??? Lung disease Father    ??? Heart disease Half-Sister    ??? Kidney disease Half-Sister    ??? Gallbladder disease Half-Brother    ??? Obesity Half-Brother    ??? No Known Problems Daughter    ??? No Known Problems Son    ??? Obesity Daughter    ??? Melanoma Neg Hx    ??? Squamous cell carcinoma Neg Hx    ??? Macular degeneration Neg Hx    ??? Glaucoma Neg Hx        ALLERGIES:  Morphine    MEDICATIONS:  Current Outpatient Medications Medication Sig Dispense Refill   ??? acetaminophen (TYLENOL) 500 MG tablet TAKE 2 TABLETS (1 000MG ) BY MOUTH EVERY 8 HOURS AS NEEDED FOR PAIN 180 each 3   ??? albuterol HFA 90 mcg/actuation inhaler Inhale 2 puffs into the lungs every 6 (six) hours as needed for wheezing or shortness of breath. 18 g 2   ??? alirocumab (PRALUENT PEN) 75 mg/mL PnIj INJECT THE CONTENTS OF 1 PEN (75 MG) UNDER THE SKIN EVERY 14 DAYS 2 mL 11   ??? allopurinol (ZYLOPRIM) 100 MG tablet Take 0.5 tablets (50 mg total) by mouth every other day. 30 tablet 11   ??? amiodarone (PACERONE) 200 MG tablet TAKE 1 TABLET BY MOUTH ONCE DAILY 90 tablet 3   ??? amLODIPine (NORVASC) 10 MG tablet Take 1 tablet (10 mg total) by mouth daily. 90 tablet PRN   ??? atorvastatin (LIPITOR) 80 MG tablet Take 1 tablet (80 mg total) by mouth daily. 90 tablet 3   ??? blood sugar diagnostic Strp USE TO CHECK BLOOD SUGAR 4 TIMES DAILY (BEFORE MEALS AND NIGHTLY) 100 each 3   ??? blood-glucose meter kit Use to check blood sugars, #1 meter, #100 strips. Freestyle glucose meter. ICD-9 250.00 1 each 0   ??? blood-glucose sensor (FREESTYLE NAVIGATOR GLUC SENS) Devi Frequency:BID   Dosage:0.0     Instructions:  Note:Dose: 1     ??? bumetanide (BUMEX) 2 MG tablet TAKE 1 TABLET BY MOUTH ONCE DAILY 90 tablet 3   ??? carvedilol (COREG) 25 MG tablet TAKE 1 TABLET BY MOUTH WITH THE MORNING MEAL AND TAKE 2 TABLETS WITH THE EVENING MEAL 270 tablet 3   ??? cholecalciferol, vitamin D3, 5,000 unit capsule TAKE 1 CAPSULE (5 000 UNITS) BY MOUTH DAILY 100 capsule 0   ??? diclofenac sodium (VOLTAREN) 1 % gel APPLY 2 GRAMS TOPICALLY THREE TIMES DAILY 900 g 5   ??? empty container Misc Use as directed to dispose of injectable medications 1 each 3   ??? ezetimibe (ZETIA) 10 mg tablet TAKE 1 TABLET BY MOUTH ONCE DAILY 90 each 3   ??? FLUoxetine (PROZAC) 20 MG capsule TAKE 3 CAPSULES (60MG ) BY MOUTH DAILY 270 capsule 3   ??? gabapentin (NEURONTIN) 300 MG capsule Take 2 capsules (600 mg total) by mouth nightly. 180 capsule 3   ??? hydrALAZINE (APRESOLINE) 25 MG tablet Take 1 tablet (25 mg total) by mouth Three (3) times a day. 270 tablet 3   ???  insulin ASPART (NOVOLOG FLEXPEN U-100 INSULIN) 100 unit/mL (3 mL) injection pen Inject 0.15 mL (15 Units total) under the skin Three (3) times a day with a meal. 15 mL 11   ??? insulin detemir U-100 (LEVEMIR FLEXTOUCH U-100 INSULN) 100 unit/mL (3 mL) injection pen Inject 0.4 mL (40 Units total) under the skin two (2) times a day. 15 mL 11   ??? insulin syringe-needle U-100 0.3 mL 31 gauge x 5/16 Syrg USE TO INJECT INSULIN TWICE DAILY WITH MEALS 60 each 0   ??? isosorbide mononitrate (IMDUR) 30 MG 24 hr tablet Take 1 tablet (30 mg total) by mouth daily. 90 tablet 3   ??? lancets 30 gauge Misc USE TO CHECK BLOOD SUGAR THREE TIMES DAILY WITH MEALS 250 each 2   ??? losartan (COZAAR) 50 MG tablet Take 1 tablet (50 mg total) by mouth daily. 90 tablet 3   ??? melatonin 3 mg cap Take 9 mg by mouth nightly.     ??? nitroglycerin (NITROSTAT) 0.4 MG SL tablet Place 1 tablet (0.4 mg total) under the tongue every five (5) minutes as needed for chest pain. 100 each 3   ??? QUEtiapine (SEROQUEL) 50 MG tablet Take 1 tablet (50 mg total) by mouth two (2) times a day as needed (headache). 180 tablet 3   ??? rivaroxaban (XARELTO) 15 mg Tab Take 1 tablet (15 mg total) by mouth daily with evening meal. 90 tablet 3   ??? semaglutide 0.25 mg or 0.5 mg(2 mg/1.5 mL) PnIj Inject 0.25 mg under the skin every seven (7) days. For 4 weeks then increase to 0.5mg  every 7 days 7.5 mL 0   ??? sevelamer (RENVELA) 800 mg tablet TAKE 2 TABLETS (1600MG ) BY MOUTH THREE TIMES DAILY WITH MEALS 540 tablet 3   ??? ticagrelor (BRILINTA) 90 mg Tab TAKE 1 TABLET BY MOUTH TWICE DAILY 180 tablet 3   ??? traMADol (ULTRAM) 50 mg tablet Take 1 tablet (50 mg total) by mouth every six (6) hours as needed. 30 tablet 0   ??? UNIFINE PENTIPS 31 gauge x 5/16 Ndle USE WITH INSULIN AND VICTOZA 5 TIMES DAILY 400 each 3     No current facility-administered medications for this visit. PHYSICAL EXAM:  Vitals:    05/11/18 0815   BP: 174/103   Pulse: 97     Body mass index is 44.99 kg/m??.  CONSTITUTIONAL: Alert,well appearing, no distress  HEENT: Moist mucous membranes, oropharynx clear without erythema or exudate  NECK: Supple, no lymphadenopathy  CARDIOVASCULAR: Regular, normal S1/S2 heart sounds, no murmurs, no rubs.   PULM: Clear to auscultation bilaterally  GASTROINTESTINAL: Soft, active bowel sounds, nontender  MSK/EXTREMITIES: 2+ lower extremity edema bilaterally, dorsalis pedis pulses 2+ bilaterally   SKIN: No rashes or lesions  NEUROLOGIC: No focal motor or sensory deficits    BP Readings from Last 5 Encounters:   05/11/18 174/103   04/23/18 168/78   04/02/18 180/96   03/18/18 180/80   03/18/18 152/73       MEDICAL DECISION MAKING    Results for orders placed or performed in visit on 04/23/18   Iron Panel   Result Value Ref Range    Iron 55 35 - 165 ug/dL    TIBC 147.8 295.6 - 213.0 mg/dL    Transferrin 865.7 846.9 - 380.0 mg/dL    Iron Saturation (%) 19 (L) 20 - 50 %   Ferritin   Result Value Ref Range    Ferritin 82.1 27.0 - 377.0 ng/mL  PTH   Result Value Ref Range    PTH 135.9 (H) 12.0 - 72.0 pg/mL    Calcium 9.0 8.5 - 10.2 mg/dL   Renal Function Panel   Result Value Ref Range    Sodium 138 135 - 145 mmol/L    Potassium 4.8 3.5 - 5.0 mmol/L    Chloride 100 98 - 107 mmol/L    CO2 22.0 22.0 - 30.0 mmol/L    Anion Gap 16 (H) 7 - 15 mmol/L    BUN 24 (H) 7 - 21 mg/dL    Creatinine 1.61 (H) 0.70 - 1.30 mg/dL    BUN/Creatinine Ratio 8     EGFR CKD-EPI Non-African American, Male 24 (L) >=60 mL/min/1.18m2    EGFR CKD-EPI African American, Male 27 (L) >=60 mL/min/1.50m2    Glucose 377 (H) 70 - 179 mg/dL    Calcium 9.0 8.5 - 09.6 mg/dL    Phosphorus 3.7 2.9 - 4.7 mg/dL    Albumin 3.4 (L) 3.5 - 5.0 g/dL     *Note: Due to a large number of results and/or encounters for the requested time period, some results have not been displayed. A complete set of results can be found in Results Review. Lab Results   Component Value Date    NA 138 04/23/2018    K 4.8 04/23/2018    CL 100 04/23/2018    CO2 22.0 04/23/2018    BUN 24 (H) 04/23/2018    CREATININE 2.96 (H) 04/23/2018    CREATININE 3.69 (H) 03/18/2018    CREATININE 3.62 (H) 03/14/2018    CREATININE 3.64 (H) 03/13/2018    CREATININE 3.33 (H) 03/12/2018    CREATININE 3.43 (H) 03/11/2018    GFRAA 25 (L) 07/16/2017    GFRAA 38 (L) 07/01/2017    GFRAA 31 (L) 06/19/2017    GFRAA 24 (L) 05/18/2017    GFRAA 25 (L) 05/17/2017    GFRAA 25 (L) 05/16/2017    GFRNONAA 21 (L) 07/16/2017    GFRNONAA 31 (L) 07/01/2017    GFRNONAA 26 (L) 06/19/2017    GFRNONAA 20 (L) 05/18/2017    GFRNONAA 20 (L) 05/17/2017    GFRNONAA 20 (L) 05/16/2017     Lab Results   Component Value Date    PCRATIOUR 1.669 03/17/2017    ALBUMIN 3.4 (L) 04/23/2018    WBC 10.3 03/14/2018    PLT 239 03/14/2018     No components found for: LASTPTH  Lab Results   Component Value Date    WBC 10.3 03/14/2018    HGB 10.2 (L) 03/14/2018    HCT 32.8 (L) 03/14/2018    PLT 239 03/14/2018     Wt Readings from Last 3 Encounters:   05/11/18 (!) 158.9 kg (350 lb 6.4 oz)   04/23/18 (!) 161.7 kg (356 lb 6.4 oz)   04/02/18 (!) 153.9 kg (339 lb 4.8 oz)     IMAGING STUDIES:    No results found.    ASSESSMENT/PLAN:  [name] is a 48 y.o. year old patient with   1. CKD (chronic kidney disease) stage 4, GFR 15-29 ml/min (CMS-[name])    2. 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 CKD IV (G4/A3). Most likely related to longstanding DM2 and HTN, both of which are poorly controlled. His BL Cr has been fluctuating from 3-3.7, but more recent values have it at the lower range of his BL.  Stressed importance of improving BP and diabetes control.   - Provided with [facility] handout for dialysis options.  - Avoid nephrotoxins, including NSAIDs  - Need urine next time as he was unable to provide today, but last month UACR was 6gm.  - Repeat BMP today. Will calculate KFRE risk based on these numbers.  - At next visit, we will discuss more about the likelihood of progression to ESKD and possible time frame of progression. He Livesay benefit from a visit with Jesse Fall too. In the meantime, asked him to look over the [facility] handout.    2. HTN. BP not adequately controlled. He did not tolerate hydralazine 50. Losartan started ~2 weeks ago.  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 Anemia. Hgb levels low at 10, but not requiring ESAs    5. Electrolytes. K 4.8 and CO2 22 now. OK. CTM.    6. Prevention. He is on a statin.    7. DM2. Recently started on ozempic, which I agree with. Stressed importance of improved diabetes control to preventing progression of CKD.  - Consider for enrollment in EMPA-kidney as eGFR is too low to start SGLT-2 inhibitor.    8. CP. I am concerned about various features of his CP (not trauma induced, radiating, worse with exertion) and advised him to call his cardiologist ASAP for further advice.    [name] will follow up in Return in about 2 months (around 07/10/2018) for Next scheduled follow up. or sooner as needed.     I personally spent a total of 45 minutes face-to-face with the patient, of which >50% was spent counseling the patient regarding CKD, risk factors for progression and importance of DM2 and HTN control.

## 2018-05-12 LAB — CBC
HEMATOCRIT: 33.5 % — ABNORMAL LOW (ref 41.0–53.0)
MEAN CORPUSCULAR HEMOGLOBIN: 27.2 pg (ref 26.0–34.0)
MEAN CORPUSCULAR VOLUME: 85.3 fL (ref 80.0–100.0)
MEAN PLATELET VOLUME: 8.2 fL (ref 7.0–10.0)
PLATELET COUNT: 244 10*9/L (ref 150–440)
RED CELL DISTRIBUTION WIDTH: 15.4 % — ABNORMAL HIGH (ref 12.0–15.0)
WBC ADJUSTED: 10.2 10*9/L (ref 4.5–11.0)

## 2018-05-12 LAB — BASIC METABOLIC PANEL
ANION GAP: 8 mmol/L (ref 7–15)
BLOOD UREA NITROGEN: 36 mg/dL — ABNORMAL HIGH (ref 7–21)
BUN / CREAT RATIO: 12
CALCIUM: 8.6 mg/dL (ref 8.5–10.2)
CHLORIDE: 110 mmol/L — ABNORMAL HIGH (ref 98–107)
CO2: 22 mmol/L (ref 22.0–30.0)
CREATININE: 3.07 mg/dL — ABNORMAL HIGH (ref 0.70–1.30)
EGFR CKD-EPI AA MALE: 26 mL/min/{1.73_m2} — ABNORMAL LOW (ref >=60)
EGFR CKD-EPI NON-AA MALE: 23 mL/min/{1.73_m2} — ABNORMAL LOW (ref >=60)
POTASSIUM: 4.8 mmol/L (ref 3.5–5.0)
SODIUM: 140 mmol/L (ref 135–145)

## 2018-05-12 LAB — WBC ADJUSTED: Lab: 10.2

## 2018-05-12 LAB — CREATININE: Creatinine:MCnc:Pt:Ser/Plas:Qn:: 3.07 — ABNORMAL HIGH

## 2018-05-12 LAB — HEPARIN CORRELATION: Lab: 0.4

## 2018-05-12 LAB — MAGNESIUM: Magnesium:MCnc:Pt:Ser/Plas:Qn:: 1.7

## 2018-05-12 LAB — TROPONIN I: Troponin I.cardiac:MCnc:Pt:Ser/Plas:Qn:: 0.871

## 2018-05-12 LAB — APTT: Coagulation surface induced:Time:Pt:PPP:Qn:Coag: 69.9 — ABNORMAL HIGH

## 2018-05-12 NOTE — Unmapped (Signed)
Pt resting comfortably with eyes shut. Patient rounding complete, call bell in reach, bed locked and in lowest position, patient belongings at bedside and within reach of patient.  Patient updated on plan of care.

## 2018-05-12 NOTE — Unmapped (Signed)
Patient rounding complete, call bell in reach, bed locked and in lowest position, patient belongings at bedside and within reach of patient.  Patient updated on plan of care.

## 2018-05-12 NOTE — Unmapped (Signed)
Patient rounds completed. The following patient needs were addressed:  Plan of Care, Call Bell in Reach and Bed Position Low . Pt reports chest pain at this time. Will give nitro and reassess

## 2018-05-12 NOTE — Unmapped (Signed)
CAC at bedside packing up patient for transport.

## 2018-05-12 NOTE — Unmapped (Signed)
Patient rounds completed. The following patient needs were addressed:  Plan of Care, Call Bell in Reach and Bed Position Low . Pt reports having mid sternal chest pain that radiates to L shoulder at this time. Told pt that I spoke with wife [name]. Pt denies needs at this time. Will continue to monitor chest pain .

## 2018-05-12 NOTE — Unmapped (Signed)
Patient rounds completed. The following patient needs were addressed:  Plan of Care, Call Bell in Reach and Bed Position Low . Meal tray ordered.

## 2018-05-12 NOTE — Unmapped (Signed)
[facility]  Emergency Department Progress Note for Continuation of Care      May 11, 2018 4:49 PM    Jeremy Oliver is a 48 y.o. male with a PMH of atrial fibrillation, CKD stage 3, CAD, DM, angina at rest, HLD, GERD, HTN, CHF (EF 55% on 03/13/2018), and current anti-coagulation on heparin who sent from the [facility] Emergency Department for evaluation of NSTEMI. EKG showed ST depressions in leads 1 and aVL. Patient received 3 NTG, 324 mg ASA, and 50 mcg fentanyl at OSH. He was not directly admitted to cardiology because beds were full. At this time, patient denies any pain.    Plan for repeat EKG and labs. Will consult cardiology.    Portions of this record have been created using Scientist, clinical (histocompatibility and immunogenetics). Dictation errors have been sought, but Molinelli not have been identified and corrected.      ED Clinical Impression     Final diagnoses:   NSTEMI (non-ST elevated myocardial infarction) (CMS-[name]) (Primary)     Documentation assistance was provided by Merwyn Katos, Scribe on May 11, 2018 at 4:49 PM for Lindwood Coke, MD.    Documentation assistance was provided by the scribe in my presence.  The documentation recorded by the scribe has been reviewed by me and accurately reflects the services I personally performed.       Loma Sousa, MD  Resident  05/12/18 [phone]

## 2018-05-12 NOTE — Unmapped (Signed)
Pt report received from [place], [place]. Pt states mild relief of chest pain after nitro. Pt denies need for other pain meds. Alert/Oriented X4. Respirations even/unlabored. Skin warm and dry. Bed locked and low position. Call light within reach. Personal belongings at reach

## 2018-05-12 NOTE — Unmapped (Signed)
[facility]  Cardiology  History and Physical Examination    Patient Name: Jeremy Oliver  MRN: [id number]  Date of Admission:  05/11/18  Date of Service: 05/11/18    Patient Active Problem List   Diagnosis   ??? CAD (coronary artery disease) (RAF-[name])   ??? Idiopathic chronic gout of left knee   ??? Hypercholesteremia   ??? Essential hypertension (RAF-[name])   ??? 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 Osteoarthritis   ??? Chronic nonalcoholic liver disease   ??? GERD (gastroesophageal reflux disease)   ??? Mild intermittent asthma without complication   ??? Chronic pain syndrome   ??? Paroxysmal atrial fibrillation (CMS-[name])   ??? OSA (obstructive sleep apnea)   ??? Tobacco use disorder   ??? Status post cardiac pacemaker procedure   ??? Type 2 diabetes mellitus with stage 4 chronic kidney disease, with long-term current use of insulin (CMS-[name])   ??? Cardiac pacemaker in situ   ??? Recurrent major depressive disorder, in full remission (CMS-[name])   ??? Adjustment disorder, unspecified   ??? Chronic heart failure with reduced ejection fraction and diastolic dysfunction (CMS-[name])   ??? Hyperkalemia   ??? CKD (chronic kidney disease) stage 4, GFR 15-29 ml/min (CMS-[name])   ??? Acquired left foot drop   ??? Decreased sensation of lower extremity, left   ??? Chronic anticoagulation        Assessment and Plan:     NSTEMI: Troponin elevated to 0.03 at [facility] and now 0.15>0.950 upon recheck in the ED. He has a significant CAD history and has known stenosis in multiple vessels. The rise in Troponin with no ST segment elevation on EKG is c/w NSTEMI. At OSH, he was started on heparin drip, received a loading dose of ASA, nitro x3, and fentanyl. We will continue with Nitro PRN now but given his elevated pressures and ongoing pain. Will continue his home Brillinta and start aspirin daily for DAPT. His last echo was in December of 2019 and showed normal EF then but will get a BNP and echo to complete a heart failure workup. Plan is for heart catheterization tomorrow.  -DAPT with ASA/Brillinta  - Heparin gtt  -D/C heparin 4 hours prior to [facility] tomorrow  -PO Nitro PRN for chest pain  -NPO for cath tomorrow  -telemetry and repeat EKG in the AM  -Echo tomorrow  -Pro- BNP  -trend trops overnight    Hypertension: Pressures at [facility] were in the 200s systolic. Pressures were down to 120s systolic when we saw him in the ED s/p nitro tablets. Will continue all home medications and start Nitro gtt if pressures become elevated overnight. We will start his Coreg tonight, and Start his other home BP meds tomorrow including Hydralazine 25 mg TID, Imdur 30 mg daily, and Losartan 50 mg daily.  - Coreg 25 mg BID tonight  - start Hydralazine 25 mg TID, Imdur 30 mg and losartan 50mg  daily tomorrow    CKD: Cr at baseline, currently no concern for AKI, plan to start IV fluids to pre-hydrate him before Cardiac cath given significant amount of contrast he will receive with the procedure.    -IVFs in the AM    T2DMl: Hx of T2DM, glucose elvated to 485 on admission to . Glucose trending downward to 272 upon recheck. Home meds include semaglutide which we will hold, this during admission as well as insulin (detemir 40units and aspart  15 units with meals). Will start him on Lantus 20 BID and sliding scale insulin. Loa need to titrate up   - Lantus 20 BID,  - sliding scale insulin    Hyperlipidemia: Lipids on admission remarkable for triglycerides 733, total cholesterol 229, HDL 27.his home medications include Praluent inject q14 days, and zetia. He just received his praluent shot a few days ago so will continue his atorvastatin 80 mg daily and Zetia 10 mg.  -cont Zetia 10 mg and atorvastatin 80 mg    Left Shoulder pain: Quite tender to palpation on exam, [place] be some MSK component to ongoing pain but unable to do a full exam due to pain. Muscles feel tight around left scapula. Will give lidocaine patch, and will consider flexeril, and acetaminophen for pain.    #) Prophylaxis: Heparin gtt 12 units  #) Code Status: FULL CODE       ---------------------------------------------------------------------------------------------------------    Chief Complant:    Chief Complaint   Patient presents with   ??? Chest Pain         History of Present Illness:  Jeremy Route Crace is a 48 y.o. male with hx of CAD, STEMI in 2018 complicated by cardiogenic shock following VT arrest with emergent PCI to mid LAD, Ischemic cardiomyopathy with ICD Conservation officer, historic buildings), CKD, paroxysmal afib, HTN, HLD, T2DM, and morbid obesity who presents to [facility] ED today as a transfer from [facility] for workup of NSTEMI. He states that 3 days ago he went to [facility] hospital for shoulder pain that was sharp in nature and worsened with movement of his left arm. He endorsed tenderness to palpation when the EMS picked him up and notes that the pain has been getting worse since then. While he was at [facility] they suspected a cardiac origin of the pain and he was found to have an elevated troponin level to 0.03. He eventually decided to leave AMA from [place]. The next day, his PCP called him and recommended that he return to the hospital to have his NSTEMI worked up. At this point, he presented to the [facility] ED for further workup and was subsequently transferred to the [facility] ED.      Upon talking to him today he says that the pain was more than a 10/10 within the last few hours but has since subsided to a  2/10 after receiving Fentanyl, nitroglycerin, and ASA in the ED in [place]. He notes the pain is all around his left chest and radiates through to his shoulder blade. The pain gets worse with movement of his left arm and is extremely tender to palpation.    Of note, with his extensive cardiac disease history, his last stent placement was in December of last year  During which they PCI with DES the mid RCA and RPLA/RPDA bifurcation. They deferred his LHC at this time and recommended aggressive DAPT with Brillinta and ASA for at least 1 year. When asked to compare the pain he is having now to the pain he was having prior to the most recent stent, he states that this pain was completely different.     Most recent Cath Report:  60 to 70% mid RCA stenosis  80 to 90% stenosis of the RPDA/RPLA bifurcation   Distal to the bifurcation, the RPDA and RPL a both have moderate non-flow limiting disease.    Most recent Echo:  Left ventricular hypertrophy - mild  Normal left ventricular systolic function, ejection fraction 55%  Normal right ventricular systolic  function    Most recent ICD Check (dual chamber defibrillator):  Normal device function, Tested Lead measurements stable and within normal range, Adequate battery reserve 7 NS-VT episodes and 91 ATR episodes recorded total < 1 % AF burden.       Primary cardiologist:  Dr. Andrey Farmer MD  PCP:  Kurtis Bushman, MD    Medical History:  Past Medical History:   Diagnosis Date   ??? Angina at rest (CMS-[name])    ??? Arthritis    ??? Asthma    ??? Atrial fibrillation and flutter (CMS-[name]) 06/08/14   ??? BMI 40.0-44.9, adult (CMS-[name]) 10/29/2015   ??? Chronic anticoagulation 02/17/2018   ??? Chronic kidney disease, stage 3 (CMS-[name])    ??? Chronic pain syndrome 04/11/2014    ~Use daily lyrica as recommended ~Tramadol as needed for pain ~Fluoxetine daily as recommended ~Daily exercise ~Eat a healthy diet ~Good control of your blood sugars can help    ??? Coronary artery disease    ??? Depression    ??? Diabetes mellitus (CMS-[name])    ??? Eye trauma 1998    glass in both eyes and removed   ??? GERD (gastroesophageal reflux disease)    ??? Gout    ??? Homeless    ??? Hyperlipidemia    ??? Hypertension    ??? Illiteracy and low-level literacy    ??? Microscopic hematuria    ??? Migraine    ??? Nephrolithiasis    ??? 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 Nonalcoholic fatty liver disease    ??? NSTEMI (non-ST elevated myocardial infarction) (CMS-[name]) 01/13/2017   ??? Obesity    ??? Obstructive sleep apnea 2009   ??? Panic attacks    ??? 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 Seizure-like activity (CMS-[name])     ~Monitor for symptoms and report them to your primary care provider if they occur    ??? Systolic CHF, chronic (CMS-[name])     EF 40-45% 2017   ??? TIA (transient ischemic attack)        Past Surgical History:   Procedure Laterality Date   ??? CARDIAC CATHETERIZATION  03/08/2007   ??? CYSTOURETHROSCOPY  12/31/2009     Microscopic hematuria   ??? KNEE SURGERY Right 09/23/2006    arthroscopic knee surgery medial and lateral meniscus tears as well as crystal disease   ??? PR CATH PLACE/CORON ANGIO, IMG SUPER/INTERP,R&L HRT CATH, L HRT VENTRIC N/A 02/13/2017    Procedure: Left/Right Heart Catheterization;  Surgeon: Marlaine Hind, MD;  Location: [facility] CATH;  Service: Cardiology   ??? PR CATH PLACE/CORON ANGIO, IMG SUPER/INTERP,W LEFT HEART VENTRICULOGRAPHY N/A 03/17/2017    Procedure: Left Heart Catheterization W Intervention;  Surgeon: Marlaine Hind, MD;  Location: [facility] CATH;  Service: Cardiology   ??? PR ECMO/ECLS INITIATION VENO-ARTERIAL N/A 03/19/2017    Procedure: ECMO/ECLS; INITIATION, VENO-ARTERIAL;  Surgeon: Lennie Odor, MD;  Location: MAIN OR [facility];  Service: Cardiac Surgery   ??? PR ECMO/ECLS RMVL PRPH CANNULA OPEN 6 YRS & OLDER Left 03/25/2017    Procedure: ECMO / ECLS PROVIDED BY PHYSICIAN;  REMOVAL OF PERIPHERAL (ARTERIAL AND/OR VENOUS) CANNULA(E), OPEN, 6 YEARS AND OLDER;  Surgeon: Alonna Buckler Ikonomidis, MD;  Location: MAIN OR [facility];  Service: Cardiac Surgery   ??? PR EPHYS EVAL W/ ABLATION SUPRAVENT ARRHYTHMIA N/A 07/19/2015    Catheter ablation of cavotricuspid isthmus for atrial flutter [facility])   ??? PR INSER HART PACER XVENOUS ATRIAL N/A 05/30/2015    [employer] dual-chamber pacemaker implant (E.Chung)   ??? PR INSERT/PLACE FLOW DIRECT CATH N/A 03/17/2017    Procedure: Insert Leave In [place];  Surgeon: Marlaine Hind, MD;  Location: [facility] CATH;  Service: Cardiology   ??? PR INSJ/RPLCMT PERM DFB W/TRNSVNS LDS 1/DUAL CHMBR N/A 05/04/2017    Procedure: ICD Implant System (Single/Dual);  Surgeon: Eldred Manges, MD;  Location: [facility] CATH;  Service: Cardiology   ??? PR NEGATIVE PRESSURE WOUND THERAPY DME >50 SQ CM Left 03/25/2017    Procedure: Neg Press Wound Tx (Vac Assist) Incl Topicals, Per Session, Tsa Greater Than/= 50 Cm Squared;  Surgeon: Alonna Buckler Ikonomidis, MD;  Location: MAIN OR [facility];  Service: Cardiac Surgery   ??? PR PRQ TRLUML CORONARY STENT W/ANGIO ONE ART/BRNCH N/A 03/12/2018    Procedure: Percutaneous Coronary Intervention;  Surgeon: Marlaine Hind, MD;  Location: [facility] CATH;  Service: Cardiology   ??? PR REBL VES DIRECT,LOW EXTREM Left 03/19/2017    Procedure: Repr Bld Vessel Direct; Lower Extrem;  Surgeon: Boykin Reaper, MD;  Location: MAIN OR [facility];  Service: Vascular   ??? PR REMV ART CLOT ILIAC-POP,LEG INCIS Left 03/25/2017    Procedure: EMBOLECTOMY OR THROMBECTOMY, WITH OR WITHOUT CATHETER; FEMOROPOPLITEAL, AORTOILIAC ARTERY, BY LEG INCISION;  Surgeon: Alonna Buckler Ikonomidis, MD;  Location: MAIN OR [facility];  Service: Cardiac Surgery   ??? PR UPPER GI ENDOSCOPY,BIOPSY N/A 02/28/2016    Procedure: UGI ENDOSCOPY; WITH BIOPSY, SINGLE OR MULTIPLE;  Surgeon: Liane Comber, MD;  Location: [facility]URES [facility];  Service: Gastroenterology   ??? PR UPPER GI ENDOSCOPY,DIAGNOSIS N/A 05/07/2016    Procedure: UGI ENDO, INCLUDE ESOPHAGUS, STOMACH, & DUODENUM &/OR JEJUNUM; DX W/WO COLLECTION SPECIMN, BY BRUSH OR WASH; Surgeon: Modena Nunnery, MD;  Location: [facility];  Service: Gastroenterology       Prior to Admission medications    Medication Dose, Route, Frequency   acetaminophen (TYLENOL) 500 MG tablet TAKE 2 TABLETS (1 000MG ) BY MOUTH EVERY 8 HOURS AS NEEDED FOR PAIN   albuterol HFA 90 mcg/actuation inhaler Inhalation   alirocumab (PRALUENT PEN) 75 mg/mL PnIj 75 mg, Subcutaneous, Every 14 days   allopurinol (ZYLOPRIM) 100 MG tablet 50 mg, Oral, Every other day   amiodarone (PACERONE) 200 MG tablet 200 mg, Oral   amLODIPine (NORVASC) 10 MG tablet 10 mg, Oral, Daily   atorvastatin (LIPITOR) 80 MG tablet 80 mg, Oral, Daily (standard)   blood sugar diagnostic Strp USE TO CHECK BLOOD SUGAR 4 TIMES DAILY (BEFORE MEALS AND NIGHTLY)   blood-glucose meter kit Use to check blood sugars, #1 meter, #100 strips. Freestyle glucose meter. ICD-9 250.00   blood-glucose sensor (FREESTYLE NAVIGATOR GLUC SENS) Devi Frequency:BID   Dosage:0.0     Instructions:  Note:Dose: 1   bumetanide (BUMEX) 2 MG tablet 2 mg, Oral   carvedilol (COREG) 25 MG tablet TAKE 1 TABLET BY MOUTH WITH THE MORNING MEAL AND TAKE 2 TABLETS WITH THE EVENING MEAL   cholecalciferol, vitamin D3, 5,000 unit capsule TAKE 1 CAPSULE (5 000 UNITS) BY MOUTH DAILY   diclofenac sodium (VOLTAREN) 1 % gel 2 g, Topical  empty container Misc Use as directed to dispose of injectable medications   ezetimibe (ZETIA) 10 mg tablet 10 mg, Oral   FLUoxetine (PROZAC) 20 MG capsule TAKE 3 CAPSULES (60MG ) BY MOUTH DAILY   gabapentin (NEURONTIN) 300 MG capsule 600 mg, Oral, Nightly   hydrALAZINE (APRESOLINE) 25 MG tablet 25 mg, Oral, 3 times a day (standard)   insulin ASPART (NOVOLOG FLEXPEN U-100 INSULIN) 100 unit/mL (3 mL) injection pen 15 Units, Subcutaneous, 3 times a day (with meals)   insulin detemir U-100 (LEVEMIR FLEXTOUCH U-100 INSULN) 100 unit/mL (3 mL) injection pen 40 Units, Subcutaneous, 2 times a day   insulin syringe-needle U-100 0.3 mL 31 gauge x 5/16 Syrg USE TO INJECT INSULIN TWICE DAILY WITH MEALS   isosorbide mononitrate (IMDUR) 30 MG 24 hr tablet 30 mg, Oral, Daily (standard)   lancets 30 gauge Misc USE TO CHECK BLOOD SUGAR THREE TIMES DAILY WITH MEALS   losartan (COZAAR) 50 MG tablet 50 mg, Oral, Daily (standard)   melatonin 3 mg cap 9 mg, Oral, Nightly   nitroglycerin (NITROSTAT) 0.4 MG SL tablet 0.4 mg, Sublingual, Every 5 min PRN   QUEtiapine (SEROQUEL) 50 MG tablet 50 mg, Oral, 2 times a day PRN   rivaroxaban (XARELTO) 15 mg Tab 15 mg, Oral, Daily   semaglutide 0.25 mg or 0.5 mg(2 mg/1.5 mL) PnIj 0.25 mg, Subcutaneous, Every 7 days, For 4 weeks then increase to 0.5mg  every 7 days   sevelamer (RENVELA) 800 mg tablet TAKE 2 TABLETS (1600MG ) BY MOUTH THREE TIMES DAILY WITH MEALS   ticagrelor (BRILINTA) 90 mg Tab 90 mg, Oral   traMADol (ULTRAM) 50 mg tablet 50 mg, Oral, Every 6 hours PRN   UNIFINE PENTIPS 31 gauge x 5/16 Ndle USE WITH INSULIN AND VICTOZA 5 TIMES DAILY   aspirin (ECOTRIN) 81 MG tablet 81 mg, Oral, Daily (standard)   liraglutide (VICTOZA 3-PAK) 0.6 mg/0.1 mL (18 mg/3 mL) injection 1.8 mg, Subcutaneous, Daily (standard)   metFORMIN (GLUCOPHAGE) 500 MG tablet 1,000 mg, Oral, 2 times a day with meals       Allergies   Allergen Reactions   ??? Morphine Palpitations     Medication error: was administered 20mL instead of 2mL     Family History:  Family History   Problem Relation Age of Onset   ??? Diabetes Mother    ??? Hyperlipidemia Mother    ??? Heart disease Mother    ??? Basal cell carcinoma Mother    ??? Blindness Mother         diabeties   ??? Obesity Mother    ??? Hyperlipidemia Father    ??? Heart disease Father    ??? Lung disease Father    ??? Heart disease Half-Sister    ??? Kidney disease Half-Sister    ??? Gallbladder disease Half-Brother    ??? Obesity Half-Brother    ??? No Known Problems Daughter    ??? No Known Problems Son    ??? Obesity Daughter    ??? Melanoma Neg Hx    ??? Squamous cell carcinoma Neg Hx    ??? Macular degeneration Neg Hx    ??? Glaucoma Neg Hx Social History:  Social History     Socioeconomic History   ??? Marital status: Married     Spouse name: [name]   ??? Number of children: 3   ??? Years of education: 13   ??? Highest education level: Not on file   Occupational History   ??? Occupation: DISABLED  Comment: Former IT sales professional   Social Needs   ??? Financial resource strain: Very hard   ??? Food insecurity     Worry: Not on file     Inability: Not on file   ??? Transportation needs     Medical: Not on file     Non-medical: Not on file   Tobacco Use   ??? Smoking status: Never Smoker   ??? Smokeless tobacco: Current User     Types: Chew   ??? Tobacco comment: Dips 1 can/week   Substance and Sexual Activity   ??? Alcohol use: Yes     Alcohol/week: 0.0 standard drinks     Comment: rare use: couple times a year, small amount   ??? Drug use: No   ??? Sexual activity: Yes     Partners: Female   Lifestyle   ??? Physical activity     Days per week: Not on file     Minutes per session: Not on file   ??? Stress: Not on file   Relationships   ??? Social Wellsite geologist on phone: More than three times a week     Gets together: More than three times a week     Attends religious service: 1 to 4 times per year     Active member of club or organization: No     Attends meetings of clubs or organizations: Never     Relationship status: Married   Other Topics Concern   ??? Do you use sunscreen? No   ??? Tanning bed use? No   ??? Are you easily burned? No   ??? Excessive sun exposure? No   ??? Blistering sunburns? No   Social History Narrative    Information updated:  01/08/16. Reviewed 08/12/17:        Born in [place]    Raised with both parents    Parents died w/in 6 mos of each other. His parents knew they were going to pass and told him just before.    Half sister and 2 half brothers        Married 30 yrs. Wife [name]    2 other children son and daughter, adopted out    Daughter Bridgette, bf DJ        Education - completed through [employer]. 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    Former Presenter, broadcasting since age 50        Lives with wife, difficulty paying bills 12/30/17     Lives next door to daughter, husband and their grandson            Review of Systems:  10 systems reviewed and are negative unless otherwise mentioned in HPI    Physical Exam  There were no vitals taken for this visit.  Wt Readings from Last 3 Encounters:   05/11/18 (!) 158.9 kg (350 lb 6.4 oz)   04/23/18 (!) 161.7 kg (356 lb 6.4 oz)   04/02/18 (!) 153.9 kg (339 lb 4.8 oz)           There is no height or weight on file to calculate BMI.  BP 128/92  - Pulse 77  - Resp 16  - SpO2 96%     General Appearance:    Alert, obese, cooperative,  no acute distress, lying on right side grimaces with left shoulder pain with movement   Head:    Normocephalic, without obvious abnormality, atraumatic.   Eyes:    conjunctiva/corneas clear, anicteric sclera.   Back:     Symmetric, no curvature, ROM normal    Lungs:     Clear to auscultation bilaterally, no increased work of breathing    Chest wall:    TTP around left rib cage and around scapula, no bony deformities   Heart:    RRR, normal S1/S2, no murmur, rub or gallop noted but difficult to appreciate given body habitus   Abdomen:     Soft, non-tender, NABS.   Extremities:   Atraumatic, no cyanosis or edema.   Neurologic:   Awake, alert and oriented x3. CNII-XII grossly intact.           Cardiac Studies    EKG: NORMAL SINUS RHYTHM, MINIMAL VOLTAGE CRITERIA FOR LVH, Bochicchio BE NORMAL VARIANT, SEPTAL INFARCT  (CITED ON OR BEFORE 11-May-2018), T WAVE ABNORMALITY, CONSIDER LATERAL ISCHEMIA  ABNORMAL ECG WHEN COMPARED WITH ECG OF 11-May-2018 12:50,  SERIAL CHANGES OF SEPTAL INFARCT  PRESENT     Lab Review   Lab Results   Component Value Date    Sodium 138 05/11/2018    Sodium Whole Blood 132 (L) 03/25/2017    Potassium 5.2 (H) 05/11/2018    Potassium, Bld 5.5 (H) 03/25/2017    Chloride 109 (H) 05/11/2018    Chloride 105 06/09/2014    CO2 18.0 (L) 05/11/2018    CO2 23 06/09/2014    BUN 38 (H) 05/11/2018    BUN 24 (H) 06/09/2014    Creatinine 2.92 (H) 05/11/2018    Creatinine 1.30 06/09/2014    Glucose 272 (H) 05/11/2018    Glucose 223 (H) 06/09/2014    Calcium 8.7 05/11/2018    Calcium 8.3 (L) 06/09/2014     Lab Results   Component Value Date    Creatine Kinase, Total 45.0 (L) 05/25/2015    Creatine Kinase, Total 40 (L) 09/15/2012    CK-MB 0.54 05/25/2015    CK-MB <0.2 09/15/2012    Troponin I 0.950 (HH) 05/11/2018    Troponin I <0.060 06/09/2014     Lab Results   Component Value Date    WBC 11.3 (H) 05/11/2018    WBC 7.5 06/09/2014    HGB 10.6 (L) 05/11/2018    Hemoglobin 8.1 (L) 03/25/2017    HCT 33.0 (L) 05/11/2018    HCT 37.9 (L) 06/09/2014    MCV 86.1 05/11/2018    MCV 86 06/09/2014    Platelet 259 05/11/2018    Platelet 190 06/09/2014     Lab Results   Component Value Date    Cholesterol 229 (H) 05/11/2018    Cholesterol, Total 379 (H) 08/30/2012    Triglycerides 733 (H) 05/11/2018    Triglycerides 400 (H) 08/30/2012    HDL 27 (L) 05/11/2018    HDL 35 (L) 08/30/2012    LDL Calculated  05/11/2018      Comment:      Unable to calculate due to triglyceride greater than 400 mg/dL.    LDL Direct 239 08/30/2012    LDL Cholesterol, Calculated 264 08/30/2012

## 2018-05-12 NOTE — Unmapped (Signed)
Patient rounds completed. The following patient needs were addressed:  Plan of Care, Call Bell in Reach and Bed Position Low . Pt reports chest pain has decreased mildly since nitro. Pt refuses any more nitro at this time.

## 2018-05-12 NOTE — Unmapped (Signed)
CP - seen at [facility] yesterday left AMA - presented to [facility] today +NSTEMI. 3 Nitro PTA on Heparin gtt. ST depression leads 1 & aVL, 324mg  ASA and 50 Fentanyl.

## 2018-05-12 NOTE — Unmapped (Signed)
Patient rounds completed. The following patient needs were addressed:  Plan of Care, Call Bell in Reach and Bed Position Low . Resting comfortably at this time.

## 2018-05-12 NOTE — Unmapped (Signed)
Patient rounds completed. The following patient needs were addressed:  Plan of Care, Call Bell in Reach and Bed Position Low . Pt denies needs at this time. NAD noted. Pt denies chest pain.

## 2018-05-12 NOTE — Unmapped (Signed)
[facility]  Cardiology  History and Physical Examination    Patient Name: Jeremy Oliver  MRN: [id number]  Date of Admission:  05/11/18  Date of Service: 05/11/18     Assessment and Plan:     Jeremy Oliver is a 48 y.o. male with hx of CAD, STEMI in 2018 complicated by cardiogenic shock following VT arrest with emergent PCI to mid LAD, Ischemic cardiomyopathy with ICD Conservation officer, historic buildings), CKD, paroxysmal afib, HTN, HLD, T2DM, and morbid obesity who presents to [facility] ED today as a transfer from [facility] for workup of NSTEMI.      NSTEMI: Troponin elevated to 0.03 at [facility] and now 0.15>0.950 upon recheck in the ED. He has a significant CAD history and has known stenosis in multiple vessels. The rise in Troponin with no ST segment elevation on EKG is c/w NSTEMI. Trops peaked at 1.09 overnight and were .871 this AM. Given his syncopal episode this AM, the LHC is doing to help definitively rule out in-stent stenosis or signs of worsening of prior known lesions. We will continue with Nitro PRN now but will consider starting nitro drip is pain and BP remains elevated. Will continue his home Brillinta and start aspirin daily for DAPT.  -LHC today with possible intervention  -DAPT with ASA/Brillinta for now pending results of LHC,  -plan to switch to [place] and home dose of Xarelto on discharge.  - Heparin gtt  -PO Nitro PRN for chest pain  -telemetry and repeat EKG in the AM  -Echo today    Hypertension: Pressures at [facility] were in the 200s systolic. Pressures overnight have been in the 160s-170s systolic. We started his Coreg last night. and plan to Start his other home BP meds today including Hydralazine 25 mg TID, Imdur 30 mg daily, and Losartan 50 mg daily after his procedure. We will more than likely titrate up his Imdur of Hydralazine to control BP better. We also started him on Norvasc 10mg  for tighter control of BP.   - Coreg 25 mg in morning and 50 mg at night  -amlodipine 10 mg daily  - continue Hydralazine 25 mg TID, Imdur 30 mg and losartan 50mg  daily    CKD: Cr at baseline, currently no concern for AKI, He was taken to [facility] more emergently than expected so no need for IVFs at this time but we will expect to see an AKI after the contrast is used for his procedure.   -continue to monitor    T2DMl: Hx of T2DM, glucose elvated to 485 on admission to [facility]. Glucose trending downward to 272 upon recheck. Glucose this AM was 156. Will continue him on Lantus 20 BID and sliding scale insulin as sugars have been much better controlled.  - Lantus 20 BID  - sliding scale insulin     Hyperlipidemia: Lipids on admission remarkable for triglycerides 733, total cholesterol 229, HDL 27.his home medications include Praluent inject q14 days, and zetia. He just received his praluent shot a few days ago so will continue his atorvastatin 80 mg daily and Zetia 10 mg.  -cont Zetia 10 mg and atorvastatin 80 mg    Left Shoulder pain: Quite tender to palpation on exam, Duck[place] be some MSK component to ongoing pain but unable to do a full exam due to pain. Received lidocaine patch. Pain worsening after syncopal event so more likely to be cardiac etiology at this point. Will continue to monitor       #) Prophylaxis: Heparin  gtt 12 units  #) Code Status: FULL CODE       ---------------------------------------------------------------------------------------------------------    Chief Complant:    Chief Complaint   Patient presents with   ??? Chest Pain         Subjective/Interval events: Overnight he notes that he did not get a lot of sleep in the ED. But still feels the pain in his shoulder that is positional in nature. He denies this chest pain is like his normal cardiac chest pain. He denies andy SOB as well. On rounds, we got paged from his nurse in the ED that he had a syncopal event and was out for 10 seconds and when he returned to consciousness he noted increasingly worse chest pain.      Primary cardiologist:  Dr. Andrey Farmer MD  PCP:  Jeremy Bushman, MD    Physical Exam  BP 107/61  - Pulse 75  - Temp 36.6 ??C (97.8 ??F) (Oral)  - Resp 22  - SpO2 97%   Wt Readings from Last 3 Encounters:   05/11/18 (!) 158.9 kg (350 lb 6.4 oz)   04/23/18 (!) 161.7 kg (356 lb 6.4 oz)   04/02/18 (!) 153.9 kg (339 lb 4.8 oz)           There is no height or weight on file to calculate BMI.  BP 107/61  - Pulse 75  - Temp 36.6 ??C (97.8 ??F) (Oral)  - Resp 22  - SpO2 97%     General Appearance:    Alert, obese, cooperative, no acute distress, lying on right side grimaces with left shoulder pain with movement   Lungs:     Clear to auscultation bilaterally, no increased work of breathing    Heart:    RRR, normal S1/S2, no murmur, rub or gallop noted but difficult to appreciate given body habitus   Abdomen:     Soft, non-tender, NABS.   Extremities:  Trace pitting edema in BLE   Neurologic:   Awake, alert and oriented x3. CNII-XII grossly intact.        ???I attest that I have reviewed the student note and that the components of the history of the present illness, the physical exam, and the assessment and plan documented were performed by me or were performed in my presence by the student where I verified the documentation and performed (or re-performed) the exam and medical decision making.???    Virgel Paling, MD  Med C

## 2018-05-12 NOTE — Unmapped (Signed)
Thanks.   Toniann Fail, will you let them know that I have already coordinated with the inpatient team if she calls again.  Thanks!

## 2018-05-12 NOTE — Unmapped (Signed)
Cardiac Catheterization Laboratory  [facility] [place] [place]  Tel: [phone]   Fax: [phone]    PRE-PROCEDURE H&P    Assessment and Plan     Jeremy Oliver is a 48 y.o. year old male with presents for left heart catheterization    Risks and benefits of the procedure were discussed with the patient. Informed consent was obtained.    We will proceed with the procedure, left heart catheterization, as planned.    Dispo: Med C  Code Status verified and updated in EPIC.    History     History of Present Illness    Jeremy Oliver is a 48 y.o. year old male with history of known coronary artery disease status post STEMI in 2018 complicated by cardiogenic shock following VT arrest with emergent PCI to his mid LAD, history of ischemic cardiomyopathy status post ICD now with recovered LVEF, CKD with baseline creatinine of 3, hypertension, diabetes, hyperlipidemia, and obesity who presents to [facility] as a transfer from [facility] emergency department for further work-up of troponin elevation and recent chest pain.  Patient had chest discomfort 3 days ago where he visited the [facility] emergency department for sharp left-sided chest and shoulder discomfort.  His troponin was very modestly elevated and he left AMA from that hospital.  He was then referred to the emergency department at [facility] through his PCPs office for further management.  In the ED, he was given aspirin, fentanyl, nitroglycerin with improvement in his chest discomfort.  His troponin peaked at just over 1 and is now downtrending.     Pertinent Cardiac Studies  LHC 03/12/18:  1. Coronary artery disease including??severe mid RCA, and distal RCA bifurcation stenosis.  2. Left heart cath deferred  3. Successful??PCI??to the mid RCA and RPLA/RPDA bifurcation??with the placement of a Synergy DES x??with excellent angiographic result and TIMI 3 flow.    LHC @ [facility] 02/2018:  1. Significant three-vessel coronary artery disease including multifocal   in-stent restenosis of up to 50-60% involving the mid LAD, 80% ostial   stenosis of large D2 branch, which is jailed by LAD stent, 70% proximal   LCx stenosis involving OM1, 60% mid RCA stenosis, and complex distal RCA   bifurcation lesion of up to 90% stenosis ([place] 1,1,1).  2. Mildly to moderately elevated left ventricular filling pressure (LVEDP   20 to 25 mmHg).      Medications Received  Medications   heparin 25,000 Units/250 mL (100 units/mL) in 0.45% saline infusion (premade) (12 Units/kg/hr ?? 158.9 kg Intravenous Rate/Dose Verify 05/12/18 0427)   heparin (porcine) 1000 unit/mL injection 2,000 Units (has no administration in time range)   albuterol (PROVENTIL HFA;VENTOLIN HFA) 90 mcg/actuation inhaler 2 puff (has no administration in time range)   amiodarone (PACERONE) tablet 200 mg (200 mg Oral Given 05/12/18 0812)   amLODIPine (NORVASC) tablet 10 mg (10 mg Oral Given 05/12/18 0812)   atorvastatin (LIPITOR) tablet 80 mg (80 mg Oral Given 05/12/18 0812)   bumetanide (BUMEX) tablet 2 mg (2 mg Oral Given 05/12/18 0833)   ezetimibe (ZETIA) tablet 10 mg (10 mg Oral Given 05/11/18 2131)   hydrALAZINE (APRESOLINE) tablet 25 mg (25 mg Oral Given 05/12/18 1404)   melatonin tablet 9 mg (9 mg Oral Given 05/11/18 2131)   nitroglycerin (NITROSTAT) SL tablet 0.4 mg (0.4 mg Sublingual Refused 05/12/18 0824)   ticagrelor (BRILINTA) tablet 90 mg (90 mg Oral Given 05/12/18 0812)   polyethylene glycol (MIRALAX)  packet 17 g (17 g Oral Given 05/12/18 0813)   senna (SENOKOT) tablet 2 tablet (2 tablets Oral Refused 05/11/18 2143)   aspirin chewable tablet 81 mg (81 mg Oral Given 05/12/18 0812)   acetaminophen (TYLENOL) tablet 650 mg (650 mg Oral Given 05/11/18 2130)   isosorbide mononitrate (IMDUR) 24 hr tablet 30 mg (30 mg Oral Given 05/12/18 0812)   insulin glargine (LANTUS) injection 20 Units (20 Units Subcutaneous Given 05/12/18 0834)   dextrose (D10W) 10% bolus 125 mL (has no administration in time range)   insulin regular (HumuLIN,NovoLIN) injection 0-12 Units (0 Units Subcutaneous Not Given 05/12/18 1056)   lidocaine (LIDODERM) 5 % patch 1 patch (0 patches Transdermal Patch Removed 05/12/18 0824)   carvediloL (COREG) tablet 25 mg (has no administration in time range)   carvediloL (COREG) tablet 50 mg (has no administration in time range)   losartan (COZAAR) tablet 50 mg (50 mg Oral Given 05/12/18 1132)   FLUoxetine (PROzac) capsule 60 mg (has no administration in time range)   FLUoxetine (PROzac) capsule 40 mg (has no administration in time range)   sevelamer (RENVELA) tablet 1,600 mg (has no administration in time range)   magnesium sulfate 2gm/90mL IVPB (0 g Intravenous Stopped 05/12/18 1015)       Review of Systems  10 Systems reviewed and negative other than noted above.    Medical History Past Medical History:   Diagnosis Date   ??? Angina at rest (CMS-[name])    ??? Arthritis    ??? Asthma    ??? Atrial fibrillation and flutter (CMS-[name]) 06/08/14   ??? BMI 40.0-44.9, adult (CMS-[name]) 10/29/2015   ??? Chronic anticoagulation 02/17/2018   ??? Chronic kidney disease, stage 3 (CMS-[name])    ??? Chronic pain syndrome 04/11/2014    ~Use daily lyrica as recommended ~Tramadol as needed for pain ~Fluoxetine daily as recommended ~Daily exercise ~Eat a healthy diet ~Good control of your blood sugars can help    ??? Coronary artery disease    ??? Depression    ??? Diabetes mellitus (CMS-[name])    ??? Eye trauma 1998    glass in both eyes and removed   ??? GERD (gastroesophageal reflux disease)    ??? Gout    ??? Homeless    ??? Hyperlipidemia    ??? Hypertension    ??? Illiteracy and low-level literacy    ??? Microscopic hematuria    ??? Migraine    ??? Nephrolithiasis    ??? 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 Nonalcoholic fatty liver disease    ??? NSTEMI (non-ST elevated myocardial infarction) (CMS-[name]) 01/13/2017   ??? Obesity    ??? Obstructive sleep apnea 2009   ??? Panic attacks    ??? 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 Seizure-like activity (CMS-[name])     ~Monitor for symptoms and report them to your primary care provider if they occur    ??? Systolic CHF, chronic (CMS-[name])     EF 40-45% 2017   ??? TIA (transient ischemic attack)      Past Surgical History:   Procedure Laterality Date   ???  CARDIAC CATHETERIZATION  03/08/2007   ??? CYSTOURETHROSCOPY  12/31/2009     Microscopic hematuria   ??? KNEE SURGERY Right 09/23/2006    arthroscopic knee surgery medial and lateral meniscus tears as well as crystal disease   ??? PR CATH PLACE/CORON ANGIO, IMG SUPER/INTERP,R&L HRT CATH, L HRT VENTRIC N/A 02/13/2017    Procedure: Left/Right Heart Catheterization;  Surgeon: Marlaine Hind, MD;  Location: [facility] CATH;  Service: Cardiology   ??? PR CATH PLACE/CORON ANGIO, IMG SUPER/INTERP,W LEFT HEART VENTRICULOGRAPHY N/A 03/17/2017    Procedure: Left Heart Catheterization W Intervention;  Surgeon: Marlaine Hind, MD;  Location: [facility] CATH;  Service: Cardiology   ??? PR ECMO/ECLS INITIATION VENO-ARTERIAL N/A 03/19/2017    Procedure: ECMO/ECLS; INITIATION, VENO-ARTERIAL;  Surgeon: Lennie Odor, MD;  Location: MAIN OR [facility];  Service: Cardiac Surgery   ??? PR ECMO/ECLS RMVL PRPH CANNULA OPEN 6 YRS & OLDER Left 03/25/2017    Procedure: ECMO / ECLS PROVIDED BY PHYSICIAN; REMOVAL OF PERIPHERAL (ARTERIAL AND/OR VENOUS) CANNULA(E), OPEN, 6 YEARS AND OLDER;  Surgeon: Alonna Buckler Ikonomidis, MD;  Location: MAIN [facility];  Service: Cardiac Surgery   ??? PR EPHYS EVAL W/ ABLATION SUPRAVENT ARRHYTHMIA N/A 07/19/2015    Catheter ablation of cavotricuspid isthmus for atrial flutter [facility])   ??? PR INSER HART PACER XVENOUS ATRIAL N/A 05/30/2015    [employer] dual-chamber pacemaker implant (E.Chung)   ??? PR INSERT/PLACE FLOW DIRECT CATH N/A 03/17/2017    Procedure: Insert Leave In [place];  Surgeon: Marlaine Hind, MD;  Location: [facility] CATH;  Service: Cardiology   ??? PR INSJ/RPLCMT PERM DFB W/TRNSVNS LDS 1/DUAL CHMBR N/A 05/04/2017    Procedure: ICD Implant System (Single/Dual);  Surgeon: Eldred Manges, MD;  Location: [facility] CATH;  Service: Cardiology   ??? PR NEGATIVE PRESSURE WOUND THERAPY DME >50 SQ CM Left 03/25/2017    Procedure: Neg Press Wound Tx (Vac Assist) Incl Topicals, Per Session, Tsa Greater Than/= 50 Cm Squared;  Surgeon: Alonna Buckler Ikonomidis, MD;  Location: MAIN OR [facility];  Service: Cardiac Surgery   ??? PR PRQ TRLUML CORONARY STENT W/ANGIO ONE ART/BRNCH N/A 03/12/2018    Procedure: Percutaneous Coronary Intervention;  Surgeon: Marlaine Hind, MD;  Location: [facility] CATH;  Service: Cardiology   ??? PR REBL VES DIRECT,LOW EXTREM Left 03/19/2017    Procedure: Repr Bld Vessel Direct; Lower Extrem;  Surgeon: Boykin Reaper, MD;  Location: MAIN OR [facility];  Service: Vascular   ??? PR REMV ART CLOT ILIAC-POP,LEG INCIS Left 03/25/2017    Procedure: EMBOLECTOMY OR THROMBECTOMY, WITH OR WITHOUT CATHETER; FEMOROPOPLITEAL, AORTOILIAC ARTERY, BY LEG INCISION;  Surgeon: Alonna Buckler Ikonomidis, MD;  Location: MAIN OR [facility];  Service: Cardiac Surgery   ??? PR UPPER GI ENDOSCOPY,BIOPSY N/A 02/28/2016    Procedure: UGI ENDOSCOPY; WITH BIOPSY, SINGLE OR MULTIPLE;  Surgeon: Liane Comber, MD;  [facility];  Service: Gastroenterology   ??? PR UPPER GI ENDOSCOPY,DIAGNOSIS N/A 05/07/2016    Procedure: UGI ENDO, INCLUDE ESOPHAGUS, STOMACH, & DUODENUM &/OR JEJUNUM; DX W/WO COLLECTION SPECIMN, BY BRUSH OR WASH;  Surgeon: Modena Nunnery, MD;  Location: [facility];  Service: Gastroenterology      Home Medications  Medications Prior to Admission   Medication Sig Dispense Refill Last Dose   ??? acetaminophen (TYLENOL) 500 MG tablet TAKE 2 TABLETS (1 000MG ) BY MOUTH EVERY 8 HOURS AS NEEDED FOR PAIN 180 each 3 Past Week at Unknown time   ??? albuterol HFA 90 mcg/actuation inhaler Inhale 2  puffs into the lungs every 6 (six) hours as needed for wheezing or shortness of breath. 18 g 2 Past Week at Unknown time   ??? alirocumab (PRALUENT PEN) 75 mg/mL PnIj INJECT THE CONTENTS OF 1 PEN (75 MG) UNDER THE SKIN EVERY 14 DAYS 2 mL 11 Past Week at Unknown time   ??? allopurinol (ZYLOPRIM) 100 MG tablet Take 0.5 tablets (50 mg total) by mouth every other day. 30 tablet 11 Past Week at Unknown time   ??? amiodarone (PACERONE) 200 MG tablet TAKE 1 TABLET BY MOUTH ONCE DAILY 90 tablet 3 05/10/2018 at Unknown time   ??? amLODIPine (NORVASC) 10 MG tablet Take 1 tablet (10 mg total) by mouth daily. 90 tablet PRN 05/10/2018 at Unknown time   ??? atorvastatin (LIPITOR) 80 MG tablet Take 1 tablet (80 mg total) by mouth daily. 90 tablet 3 05/10/2018 at Unknown time   ??? blood sugar diagnostic Strp USE TO CHECK BLOOD SUGAR 4 TIMES DAILY (BEFORE MEALS AND NIGHTLY) 100 each 3 Past Week at Unknown time   ??? blood-glucose meter kit Use to check blood sugars, #1 meter, #100 strips. Freestyle glucose meter. ICD-9 250.00 1 each 0 Past Week at Unknown time   ??? blood-glucose sensor (FREESTYLE NAVIGATOR GLUC SENS) Devi Frequency:BID   Dosage:0.0     Instructions:  Note:Dose: 1   Past Week at Unknown time   ??? bumetanide (BUMEX) 2 MG tablet TAKE 1 TABLET BY MOUTH ONCE DAILY 90 tablet 3 05/10/2018 at Unknown time   ??? carvedilol (COREG) 25 MG tablet TAKE 1 TABLET BY MOUTH WITH THE MORNING MEAL AND TAKE 2 TABLETS WITH THE EVENING MEAL 270 tablet 3 05/10/2018 at Unknown time   ??? cholecalciferol, vitamin D3, 5,000 unit capsule TAKE 1 CAPSULE (5 000 UNITS) BY MOUTH DAILY 100 capsule 0 05/10/2018 at Unknown time   ??? diclofenac sodium (VOLTAREN) 1 % gel Apply 2 g topically Three (3) times a day as needed for pain.      ??? empty container Misc Use as directed to dispose of injectable medications 1 each 3 Past Week at Unknown time   ??? ezetimibe (ZETIA) 10 mg tablet TAKE 1 TABLET BY MOUTH ONCE DAILY 90 each 3 05/10/2018 at Unknown time   ??? FLUoxetine (PROZAC) 20 MG capsule Take 60 mg by mouth nightly.      ??? gabapentin (NEURONTIN) 300 MG capsule Take 2 capsules (600 mg total) by mouth nightly. 180 capsule 3 Past Week at Unknown time   ??? hydrALAZINE (APRESOLINE) 25 MG tablet Take 1 tablet (25 mg total) by mouth Three (3) times a day. 270 tablet 3 05/10/2018 at Unknown time   ??? insulin ASPART (NOVOLOG FLEXPEN U-100 INSULIN) 100 unit/mL (3 mL) injection pen Inject 0.15 mL (15 Units total) under the skin Three (3) times a day with a meal. 15 mL 11 05/10/2018 at Unknown time   ??? insulin detemir U-100 (LEVEMIR) 100 unit/mL (3 mL) injection pen Inject 40 Units under the skin nightly.      ??? insulin syringe-needle U-100 0.3 mL 31 gauge x 5/16 Syrg USE TO INJECT INSULIN TWICE DAILY WITH MEALS 60 each 0 Past Week at Unknown time   ??? isosorbide mononitrate (IMDUR) 30 MG 24 hr tablet Take 1 tablet (30 mg total) by mouth daily. 90 tablet 3 05/10/2018 at Unknown time   ??? lancets 30 gauge Misc USE TO CHECK BLOOD SUGAR THREE TIMES DAILY WITH MEALS 250 each 2 Past Week at Unknown time   ???  losartan (COZAAR) 50 MG tablet Take 1 tablet (50 mg total) by mouth daily. 90 tablet 3 05/10/2018 at Unknown time   ??? melatonin 3 mg cap Take 9 mg by mouth nightly.   05/10/2018 at Unknown time   ??? nitroglycerin (NITROSTAT) 0.4 MG SL tablet Place 1 tablet (0.4 mg total) under the tongue every five (5) minutes as needed for chest pain. 100 each 3 Past Month at Unknown time   ??? QUEtiapine (SEROQUEL) 50 MG tablet Take 1 tablet (50 mg total) by mouth two (2) times a day as needed (headache). 180 tablet 3 Past Week at Unknown time   ??? rivaroxaban (XARELTO) 15 mg Tab Take 1 tablet (15 mg total) by mouth daily with evening meal. 90 tablet 3 05/10/2018 at Unknown time   ??? semaglutide 0.25 mg or 0.5 mg(2 mg/1.5 mL) PnIj Inject 0.25 mg under the skin every seven (7) days. For 4 weeks then increase to 0.5mg  every 7 days 7.5 mL 0 Past Week at Unknown time   ??? sevelamer (RENVELA) 800 mg tablet TAKE 2 TABLETS (1600MG ) BY MOUTH THREE TIMES DAILY WITH MEALS 540 tablet 3 05/10/2018 at Unknown time   ??? ticagrelor (BRILINTA) 90 mg Tab TAKE 1 TABLET BY MOUTH TWICE DAILY 180 tablet 3 05/10/2018 at Unknown time   ??? traMADol (ULTRAM) 50 mg tablet Take 1 tablet (50 mg total) by mouth every six (6) hours as needed. 30 tablet 0 Past Week at Unknown time   ??? UNIFINE PENTIPS 31 gauge x 5/16 Ndle USE WITH INSULIN AND VICTOZA 5 TIMES DAILY 400 each 3 Past Week at Unknown time      Allergies is allergic to morphine.   Family History family history includes Basal cell carcinoma in his mother; Blindness in his mother; Diabetes in his mother; Gallbladder disease in his half-brother; Heart disease in his father, half-sister, and mother; Hyperlipidemia in his father and mother; Kidney disease in his half-sister; Lung disease in his father; No Known Problems in his daughter and son; Obesity in his daughter, half-brother, and mother.   Social History Alcohol use:  reports current alcohol use.  Tobacco use:  reports that he has never smoked. His smokeless tobacco use includes chew.  Drug use:  reports no history of drug use.     Objective     Temp:  [36.6 ??C-36.9 ??C] 36.6 ??C  Heart Rate:  [70-88] 79  SpO2 Pulse:  [70-87] 80  Resp:  [14-24] 14  BP: (102-177)/(55-112) 140/69  MAP (mmHg):  [66-127] 89  SpO2:  [93 %-100 %] 100 %  Wt Readings from Last 4 Encounters:   05/11/18 (!) 158.9 kg (350 lb 6.4 oz)   04/23/18 (!) 161.7 kg (356 lb 6.4 oz)   04/02/18 (!) 153.9 kg (339 lb 4.8 oz)   03/18/18 (!) 151.5 kg (334 lb)     Physical Exam  GEN: NAD  HEENT: EOMI, sclera anicteric, conjunctiva noninjected  Respiratory: Normal WOB, CTABL  Cardiovascular: RRR, no m/r/g, JVP <5cm  Abdominal: Soft, NT, ND  Extremities: Warm, well perfused, no cyanosis, trace edema, pulses 2+ Neuro: CN II - XII grossly intact  Psych: appropriate, normal affect    Recent laboratory values, ECG, and imaging reviewed prior to procedure.

## 2018-05-12 NOTE — Unmapped (Signed)
Patient rounds completed. The following patient needs were addressed:  Pain, Toileting, Personal Belongings, Plan of Care, Call Bell in Reach and Bed Position Low .

## 2018-05-12 NOTE — Unmapped (Signed)
Patient rounding complete, call bell in reach, bed locked and in lowest position, patient belongings at bedside and within reach of patient.  Patient updated on plan of care.    Pt reports worsening chest pain and now feeling lightheaded, pt had brief syncopal episode, MD at bedside and new EKG ordered.

## 2018-05-12 NOTE — Unmapped (Signed)
Pt 4 hour reassessment: Pt's troponin's downtrending. Pt denies chest pain. No acute distress noted. Breathing even and effortless. Patient rounding complete, call bell in reach, bed locked and in lowest position, patient belongings at bedside and within reach of patient.  Patient updated on plan of care.

## 2018-05-12 NOTE — Unmapped (Signed)
Patient rounding complete, call bell in reach, bed locked and in lowest position, patient belongings at bedside and within reach of patient.  Patient updated on plan of care.    No changes from previous assessment

## 2018-05-12 NOTE — Unmapped (Signed)
Wife [name] called to say he is in the ER here and they are planning to do the cath today.  Waiting for a bed and she just wanted you to know.

## 2018-05-13 DIAGNOSIS — I214 Non-ST elevation (NSTEMI) myocardial infarction: Principal | ICD-10-CM

## 2018-05-13 LAB — HEMATOCRIT: Lab: 32.7 — ABNORMAL LOW

## 2018-05-13 LAB — BASIC METABOLIC PANEL
ANION GAP: 10 mmol/L (ref 7–15)
ANION GAP: 11 mmol/L (ref 7–15)
BLOOD UREA NITROGEN: 39 mg/dL — ABNORMAL HIGH (ref 7–21)
BLOOD UREA NITROGEN: 42 mg/dL — ABNORMAL HIGH (ref 7–21)
BUN / CREAT RATIO: 11
BUN / CREAT RATIO: 10
CALCIUM: 8.5 mg/dL (ref 8.5–10.2)
CALCIUM: 8.9 mg/dL (ref 8.5–10.2)
CHLORIDE: 105 mmol/L (ref 98–107)
CO2: 19 mmol/L — ABNORMAL LOW (ref 22.0–30.0)
CO2: 22 mmol/L (ref 22.0–30.0)
CREATININE: 3.49 mg/dL — ABNORMAL HIGH (ref 0.70–1.30)
CREATININE: 4.18 mg/dL — ABNORMAL HIGH (ref 0.70–1.30)
EGFR CKD-EPI AA MALE: 22 mL/min/{1.73_m2} — ABNORMAL LOW (ref >=60)
EGFR CKD-EPI AA MALE: 18 mL/min/{1.73_m2} — ABNORMAL LOW (ref >=60)
EGFR CKD-EPI NON-AA MALE: 16 mL/min/{1.73_m2} — ABNORMAL LOW (ref >=60)
POTASSIUM: 5 mmol/L (ref 3.5–5.0)
POTASSIUM: 4.9 mmol/L (ref 3.5–5.0)
SODIUM: 133 mmol/L — ABNORMAL LOW (ref 135–145)
SODIUM: 138 mmol/L (ref 135–145)

## 2018-05-13 LAB — CBC
HEMATOCRIT: 32.7 % — ABNORMAL LOW (ref 41.0–53.0)
HEMATOCRIT: 33.2 % — ABNORMAL LOW (ref 41.0–53.0)
HEMOGLOBIN: 10.2 g/dL — ABNORMAL LOW (ref 13.5–17.5)
HEMOGLOBIN: 10.7 g/dL — ABNORMAL LOW (ref 13.5–17.5)
MEAN CORPUSCULAR HEMOGLOBIN CONC: 31.1 g/dL (ref 31.0–37.0)
MEAN CORPUSCULAR HEMOGLOBIN CONC: 32.1 g/dL (ref 31.0–37.0)
MEAN CORPUSCULAR HEMOGLOBIN: 26.7 pg (ref 26.0–34.0)
MEAN CORPUSCULAR HEMOGLOBIN: 27.4 pg (ref 26.0–34.0)
MEAN CORPUSCULAR VOLUME: 85.4 fL (ref 80.0–100.0)
MEAN PLATELET VOLUME: 8.1 fL (ref 7.0–10.0)
MEAN PLATELET VOLUME: 8.1 fL (ref 7.0–10.0)
PLATELET COUNT: 239 10*9/L (ref 150–440)
PLATELET COUNT: 248 10*9/L (ref 150–440)
RED BLOOD CELL COUNT: 3.81 10*12/L — ABNORMAL LOW (ref 4.50–5.90)
RED CELL DISTRIBUTION WIDTH: 16.1 % — ABNORMAL HIGH (ref 12.0–15.0)
RED CELL DISTRIBUTION WIDTH: 15.8 % — ABNORMAL HIGH (ref 12.0–15.0)
WBC ADJUSTED: 10.5 10*9/L (ref 4.5–11.0)
WBC ADJUSTED: 8.9 10*9/L (ref 4.5–11.0)

## 2018-05-13 LAB — MAGNESIUM
Magnesium:MCnc:Pt:Ser/Plas:Qn:: 1.8
Magnesium:MCnc:Pt:Ser/Plas:Qn:: 2.1

## 2018-05-13 LAB — TROPONIN I: Troponin I.cardiac:MCnc:Pt:Ser/Plas:Qn:: 0.409

## 2018-05-13 LAB — PHOSPHORUS
Phosphate:MCnc:Pt:Ser/Plas:Qn:: 4.3
Phosphate:MCnc:Pt:Ser/Plas:Qn:: 5.2 — ABNORMAL HIGH

## 2018-05-13 LAB — ANION GAP: Anion gap 3:SCnc:Pt:Ser/Plas:Qn:: 11

## 2018-05-13 LAB — FREE T4: Thyroxine.free:MCnc:Pt:Ser/Plas:Qn:: 1.1

## 2018-05-13 LAB — TRIGLYCERIDES: Triglyceride:MCnc:Pt:Ser/Plas:Qn:: 654 — ABNORMAL HIGH

## 2018-05-13 LAB — THYROID STIMULATING HORMONE: Thyrotropin:ACnc:Pt:Ser/Plas:Qn:: 5.06 — ABNORMAL HIGH

## 2018-05-13 LAB — CREATININE: Creatinine:MCnc:Pt:Ser/Plas:Qn:: 3.49 — ABNORMAL HIGH

## 2018-05-13 LAB — HEMOGLOBIN: Lab: 10.7 — ABNORMAL LOW

## 2018-05-13 NOTE — Unmapped (Signed)
Advanced Heart Failure/Transplant/LVAD (MDD) Cardiology Progress Note    Patient Name: Jeremy Oliver  MRN: [id number]  Date of Service: May 13, 2018     Reason for Admission:   Jeremy Oliver is a 48 y.o. male with hx of CAD, STEMI in 2018 complicated by cardiogenic shock following VT arrest with emergent PCI to mid LAD,??Ischemic cardiomyopathy??with ICD [facility]),??CKD, paroxysmal afib, HTN,??HLD,??T2DM, and morbid obesity. He presents to [facility] ED on 02/11 as a transfer from [facility] for workup of NSTEMI.      Assessment and Plan:     NSTEMI: Troponin elevated and peaked at 1.09 on 02/11. Patient with significant CAD history and has known stenosis in multiple vessels. The rise in Troponin with no ST segment elevation on EKG is c/w NSTEMI. Patient is now s/p LHC with stent to LAD (02/12)  - holding losartan until seen outpatient next week (in the setting of Cr bump post contrast)  - Coreg 25 mg daily and 50 mg nightly  - Xarelto 15 mg daily (home dose)  - Brilinta 90 mg BID (home dose)  - Atorvastatin 80 mg daily   -telemetry    -Echo - pending  ??  Hypertension: Pressures at [facility] were in the 200s systolic. Pressures here on admission have been in the 160s-170s systolic. Blood pressure is now better controlled with PO meds.    - We started Norvasc 10mg  for tighter control of BP.   - Coreg 25 mg in morning and 50 mg at night  - amlodipine 10 mg daily  - continue Hydralazine 25 mg TID, Imdur 30 mg and losartan 50mg  daily (Losartan held on 02/13 with Cr bump and to be resumed per outpatient assessment with cards next week)    Paroxsymal A. Fib  - continue home Amiodarone 200 mg daily  - recheck TSH and free T4  ??  CKD: Cr at baseline on admission, but bumped to 3.5 from 3.1 post LHC contrast.   - Gentle IVF hydration today.  - PM chem 10 labs to monitor Cr and electrolytes  - continue to monitor  ??  T2DMl: Hx of T2DM, glucose elvated to 485 on admission to [place]. Glucose trending downward to 272 upon recheck. Glucose this AM was 156. Will continue him on Lantus 20 BID and sliding scale insulin as sugars have been much better controlled.  - Lantus 25 BID  - sliding scale insulin     Hyperlipidemia: Lipids on admission remarkable for triglycerides 733, total cholesterol 229, HDL 27.his home medications include Praluent inject q14 days, and zetia. He just received his praluent shot a few days ago so will continue his atorvastatin 80 mg daily and Zetia 10 mg.  -cont Zetia 10 mg and atorvastatin 80 mg  ??  Left Shoulder pain: Quite tender to palpation on exam, Kobayashi be some MSK component to ongoing pain but unable to do a full exam due to pain. Received lidocaine patch. Pain worsening after syncopal event so more likely to be cardiac etiology at this point. Will continue to monitor   ??- lidocaine patch  ??  #) Prophylaxis:    #) Code Status: FULL CODE verified on admission  ??  -------------------------------------------------------------------------------------------------------------------     Interval History/Subjective:     Patient slept well overnight. We discussed importance of not chewing tobacco products and adhering to heart healthy diet, and low carbs/sugars in the setting of his DM. We also discussed that we'll monitor him today and check kidney function in the  afternoon after giving gentle IVF today     Objective:     Medications:  ??? amiodarone  200 mg Oral Daily   ??? amLODIPine  10 mg Oral Daily   ??? atorvastatin  80 mg Oral Daily   ??? carvediloL  25 mg Oral QAM AC   ??? carvediloL  50 mg Oral Nightly   ??? ezetimibe  10 mg Oral Nightly   ??? FLUoxetine  60 mg Oral Nightly   ??? hydrALAZINE  25 mg Oral TID   ??? insulin glargine  25 Units Subcutaneous BID   ??? insulin regular  0-12 Units Subcutaneous ACHS   ??? isosorbide mononitrate  30 mg Oral Daily   ??? lidocaine  1 patch Transdermal Daily   ??? melatonin  9 mg Oral Nightly   ??? polyethylene glycol  17 g Oral Daily   ??? rivaroxaban  15 mg Oral Daily   ??? senna  2 tablet Oral Nightly   ??? sevelamer  1,600 mg Oral 3xd Meals   ??? ticagrelor  90 mg Oral BID     ??? sodium chloride 100 mL/hr (05/13/18 0927)     acetaminophen, albuterol, dextrose, nitroglycerin, ondansetron    Physical Examination:  Temp:  [36.6 ??C-37.2 ??C] 37.2 ??C  Heart Rate:  [71-95] 87  SpO2 Pulse:  [80] 80  Resp:  [12-23] 18  BP: (101-164)/(42-79) 139/73  MAP (mmHg):  [60-106] 100  SpO2:  [92 %-100 %] 97 %    Height: 188 cm (6' 2)  Body mass index is 44.01 kg/m??.  Wt Readings from Last 3 Encounters:   05/12/18 (!) 155.5 kg (342 lb 12.8 oz)   05/11/18 (!) 158.9 kg (350 lb 6.4 oz)   04/23/18 (!) 161.7 kg (356 lb 6.4 oz)       General:  Sitting up in bed. Morbidly obese male. No acute distress  HEENT:  NCAT. MMM  Lungs: CTA B/L , no increased work of breathing  CV: RRR  Abd: Soft, NT. Mildly distended  FTD:DUKG and well perfused  Neuro: awake, alert and oriented x3      Intake/Output Summary (Last 24 hours) at 05/13/2018 1209  Last data filed at 05/13/2018 1100  Gross per 24 hour   Intake 480 ml   Output 550 ml   Net -70 ml     I/O last 3 completed shifts:  In: 290 [P.O.:240; I.V.:50]  Out: 1450 [Urine:1450]  I/O       02/11 0701 - 02/12 0700 02/12 0701 - 02/13 0700 02/13 0701 - 02/14 0700    P.O.  240 240    I.V. (mL/kg)  50 (0.3)     Total Intake  290 240    Urine (mL/kg/hr) 900 550 (0.1) 0 (0)    Stool   0    Total Output(mL/kg) 900 550 (3.5) 0 (0)    Net -900 -260 +240           Urine Occurrence   1 x    Stool Occurrence   1 x         Labs & Imaging:  Reviewed in EPIC.   Lab Results   Component Value Date    WBC 10.5 05/13/2018    HGB 10.2 (L) 05/13/2018    HCT 32.7 (L) 05/13/2018    PLT 239 05/13/2018     Lab Results   Component Value Date    NA 133 (L) 05/13/2018    K 5.0 05/13/2018    CL 104  05/13/2018    CO2 19.0 (L) 05/13/2018    BUN 39 (H) 05/13/2018    CREATININE 3.49 (H) 05/13/2018    GLU 245 (H) 05/13/2018    CALCIUM 8.5 05/13/2018    MG 1.8 05/13/2018    PHOS 4.3 05/13/2018     Lab Results   Component Value Date    BILITOT 0.2 05/11/2018    BILIDIR 0.50 (H) 04/07/2017    PROT 6.7 05/11/2018    ALBUMIN 3.4 (L) 05/11/2018    ALT 11 05/11/2018    AST 17 (L) 05/11/2018    ALKPHOS 94 05/11/2018    GGT 32 01/09/2012     Lab Results   Component Value Date    LABPROT 10.5 10/16/2013    INR 1.03 05/11/2018    APTT 69.9 (H) 05/12/2018        Lab Results   Component Value Date    INR, POC 1.2 02/12/2018    INR, POC 0.9 02/01/2018    INR 1.03 05/11/2018    INR 0.95 05/11/2018    LDH 1,231 (H) 03/21/2017    LDH 4,526 (H) 03/18/2017    PRO-BNP 1,670.0 (H) 05/11/2018    PRO-BNP 551.0 (H) 11/22/2017    PRO-BNP 231 (H) 06/08/2014

## 2018-05-13 NOTE — Unmapped (Signed)
Pt transferred from cath Lab after LHC from his L radial, TR band on, site is clean/dry/intact without bleed or hematoma formation. Paged the team about Pt arrival, education about monitor Pt VS and limit the movement of L arm provided, care setting, pain evaluation, and fall precaution, etc, provided to Pt. Pt now is on X2 for constant monitor.  Problem: Adult Inpatient Plan of Care  Goal: Plan of Care Review  Outcome: Progressing  Goal: Patient-Specific Goal (Individualization)  Outcome: Progressing  Goal: Absence of Hospital-Acquired Illness or Injury  Outcome: Progressing  Goal: Optimal Comfort and Wellbeing  Outcome: Progressing  Goal: Readiness for Transition of Care  Outcome: Progressing  Goal: Rounds/Family Conference  Outcome: Progressing     Problem: Wound  Goal: Optimal Wound Healing  Outcome: Progressing     Problem: Fall Injury Risk  Goal: Absence of Fall and Fall-Related Injury  Outcome: Progressing     Problem: Self-Care Deficit  Goal: Improved Ability to Complete Activities of Daily Living  Outcome: Progressing

## 2018-05-13 NOTE — Unmapped (Signed)
Problem: Adult Inpatient Plan of Care  Goal: Plan of Care Review  Outcome: Progressing  Goal: Patient-Specific Goal (Individualization)  Outcome: Progressing  Goal: Absence of Hospital-Acquired Illness or Injury  Outcome: Progressing  Goal: Optimal Comfort and Wellbeing  Outcome: Progressing  Goal: Readiness for Transition of Care  Outcome: Progressing  Goal: Rounds/Family Conference  Outcome: Progressing     Problem: Wound  Goal: Optimal Wound Healing  Outcome: Progressing     Problem: Fall Injury Risk  Goal: Absence of Fall and Fall-Related Injury  Outcome: Progressing     Problem: Diabetes Comorbidity  Goal: Blood Glucose Level Within Desired Range  Outcome: Progressing     Problem: Hypertension Comorbidity  Goal: Blood Pressure in Desired Range  Outcome: Progressing     Problem: Pain Chronic (Persistent) (Comorbidity Management)  Goal: Acceptable Pain Control and Functional Ability  Outcome: Progressing   AXOX4, VSS, Pt s/p left heart cath via left wrist area, pt requested for air to be removed slowly, he was concerned of bleeding. Left wrist site CDI, no bleeding, no hematoma noted, palpable pulse noted, no tingling nor numbness on the LUE.   Pt had 42 and 52 beats of V-tach per telemetry, pt was asymptomatic, MD notified, no new order received.

## 2018-05-13 NOTE — Unmapped (Signed)
Met with Jeremy Oliver, 47 y.o., for treatment of tobacco use/dependence.    SUMMARY: Sw utilized Motivational Interviewing to assess Pt's motivation/readiness for tobacco cessation. Pt expressed that he has 1 can of dip a week and does not intend to become tobacco free after discharge. Pt expressed that he has tried utilizing NRT but it doesn't give him the same feeling as dip does. Pt denied experience with cravings or withdrawal symptoms and subsequently declined NRT. Pt reports he received NRT after previous hospitalization but has given NRT to his brother to assist with his quit attempt. Sw encouraged Pt to think of benefits of quitting, related to mental, physical and financial health. Sw and Pt discussed additional behavioral strategies that Pt could potentially enact. Sw and Pt discussed behavioral distractions associated with engaging in repetitive or simple activities during urge window. Pt can also engage in cognitive distractions (thinking about what needs to be done, errands, or make a to-do list of priorities. Sw and Pt discussed self talk/visualization strategy of reframing perspective (this will get easier,) or visualization of Pt not dipping. Sw encouraged Pt to think of benefits of quitting, related to mental, physical and financial health.. Pt can reorganize some of his daily routines to disassociate activities from tobacco use. Pt was receptive to strategies and shared that he would give it a try but that he would mostly end up dipping again. Sw discussed cognitive behavioral techniques to assist in cognitive reframing; however pt was not receptive. SW provided pt with contact information, physical improvements related to tobacco cessation, and available resources (including outpatient Tobacco Treatment Program at [facility] and [place] [facility]). Please see below for NRT recommendations in bold.     Medications Recommended During Hospitalization: Patient declines  Outpatient/Discharge Medications Recommended: Patient declines    Tobacco Use Treatment  Program: Hospital Inpatient  Type of Visit: Initial  Tobacco Use Treatment Visit: Talked with patient  Goals Of Session: Assessment, Insight, increase, Communication of feelings, Problem-solving skill development, Risk behaviors, modulate, Stress management, increase, Relapse prevention skills training    Tobacco Use During Past 30 Days  Time Since Last Tobacco Use: 1 to 7 days ago  Tobacco Withdrawal (Past 24 Hours): None noted  Type of Tobacco Products Used: Smokeless  Quantity Used: 1  Quantity Per: week  Other Household Members Use Tobacco: No    Tobacco Use History  Medications Used in Past Attempts: Varenicline, Nicotine Gum, Nicotine Patch, Nicotine Lozenge, Bupropion  Previously seen by NDP?: Hospital Inpatient    Behavioral Assessment  Why Uses: 1. Pt enjoys the feeling that tobacco gives him 2. Long standing habit 3. Stress 4. Enjoys dipping in te mornings, around the house or whenever he is outside   Reasons to Become Tobacco Free: 1. Health  Barriers/Challenges: 1. Long standing habit 2. Pt states that NRT doesn't give him the same feeling as dip does 3. Stress 4. Pt has integrated tobacco use with daily routines/activities   Strategies: 1. Sw and Pt discussed behavioral distractions associated with engaging in repetitive or simple activities during urge window. Pt can also engage in cognitive distractions (thinking about what needs to be done, errands, or make a to-do list of priorities 2. w and Pt discussed self talk/visualization strategy of reframing perspective (this will get easier,) or visualization of Pt not dipping. 3. Sw encouraged Pt to think of benefits of quitting, related to mental, physical and financial health. 4. Pt can reorganize some of his daily routines to disassociate activities from  tobacco use     Treatment Plan  Cessation Meds Currently Using: None  Medications Recommended During Hospitalization: Patient declines  Outpatient/Discharge Medications Recommended: Patient declines  Patient's Plan Post Discharge/Visit: Does not plan to quit in next 6 months    TTS Information  Diagnosis: Tobacco use disorder, unspecified, uncomplicated (F17.200)  Interventions: Behavioral techniques, Assessed, Discussed, Informed, Motivational interviewing, Suggested, Encouraged, Supportive therapy, Psycho-education, Tx plan development, Cognitive restructuring  TTS Visit Length: Psychotherapy >16 min    Jeremy Oliver, MSW, LCSW, NCTTP  Clinical Social Worker/ Tobacco Treatment Specialist  Inpatient Tobacco Treatment Program   Phone: [phone]  Pager: [phone]

## 2018-05-13 NOTE — Unmapped (Signed)
Brief cath/PCI note    Recent distal RCA/P DA stent site looks great.    Continued numerous moderate/severe lesions in the left coronary system.  The proximal/mid LAD in-stent restenosis appears to have worsened and was felt to be the culprit.  Successfully stented.  Subsequent impaired flow at an adjacent moderate diagonal, which was small and had pre-existing ostial disease.  Recrossed and treated with balloon angioplasty with restored flow    Vascularization efforts stop there due to renal function and large contrast load    He received a couple of doses of aspirin.  We will continue ticagrelor.  Resume Xarelto for now given his history of DVT.

## 2018-05-13 NOTE — Unmapped (Signed)
CCM Follow-up Call      PCP Action:None     CCM Action:spoke w/ wife [name] re: will follow up w/ all visits 05/21/18 the following week. wife concerned re: [name] is apathetic to wanting to care for himself. He doesn't try anymore.   [name] worries about his soda intake, however does eat baked food instead of fried.     Patient Action: inpatient another day for fluids post cardiac stent placed 05/12/18. Come to f/u visits 05/21/18      Follow up/Discuss next call: mood, pain , DM, CAD, HF, CKD    [name] calls, states has 10 more days for eviction.  Daughter not helping, however [name] states, I can do this.    Reassured [name] that it's good to follow up on kidney management today. She was super/yelling upset b/c  was told pt could go home and then told he could not. Explained pt has many variables and concerns right now, better safe than sorry. Thankful they found artery to stent. Now for recovery.    [name] concerned about recent lab results for BG and trig. - CCM stated they would be repeated in the future, and not completely accurate since pt was not fasting.    [name] states pt is doing well today, outside of being groggy- he couldn't sleep last night.    pt currently inpatient and wife was at home when talking to CCM.    CCM will follow up with pt prn.

## 2018-05-14 DIAGNOSIS — I214 Non-ST elevation (NSTEMI) myocardial infarction: Principal | ICD-10-CM

## 2018-05-14 LAB — TROPONIN I
TROPONIN I: 0.277 ng/mL (ref <0.034)
Troponin I.cardiac:MCnc:Pt:Ser/Plas:Qn:: 0.277

## 2018-05-14 LAB — BASIC METABOLIC PANEL
ANION GAP: 9 mmol/L (ref 7–15)
ANION GAP: 11 mmol/L (ref 7–15)
ANION GAP: 13 mmol/L (ref 7–15)
BLOOD UREA NITROGEN: 43 mg/dL — ABNORMAL HIGH (ref 7–21)
BLOOD UREA NITROGEN: 45 mg/dL — ABNORMAL HIGH (ref 7–21)
BLOOD UREA NITROGEN: 45 mg/dL — ABNORMAL HIGH (ref 7–21)
BUN / CREAT RATIO: 8
BUN / CREAT RATIO: 8
CALCIUM: 8.7 mg/dL (ref 8.5–10.2)
CALCIUM: 8.8 mg/dL (ref 8.5–10.2)
CALCIUM: 8.9 mg/dL (ref 8.5–10.2)
CHLORIDE: 107 mmol/L (ref 98–107)
CHLORIDE: 108 mmol/L — ABNORMAL HIGH (ref 98–107)
CO2: 20 mmol/L — ABNORMAL LOW (ref 22.0–30.0)
CO2: 17 mmol/L — ABNORMAL LOW (ref 22.0–30.0)
CO2: 17 mmol/L — ABNORMAL LOW (ref 22.0–30.0)
CREATININE: 5.05 mg/dL — ABNORMAL HIGH (ref 0.70–1.30)
CREATININE: 5.31 mg/dL — ABNORMAL HIGH (ref 0.70–1.30)
EGFR CKD-EPI AA MALE: 14 mL/min/{1.73_m2} — ABNORMAL LOW (ref >=60)
EGFR CKD-EPI AA MALE: 14 mL/min/{1.73_m2} — ABNORMAL LOW (ref >=60)
EGFR CKD-EPI AA MALE: 13 mL/min/{1.73_m2} — ABNORMAL LOW (ref >=60)
EGFR CKD-EPI NON-AA MALE: 12 mL/min/{1.73_m2} — ABNORMAL LOW (ref >=60)
EGFR CKD-EPI NON-AA MALE: 12 mL/min/{1.73_m2} — ABNORMAL LOW (ref >=60)
EGFR CKD-EPI NON-AA MALE: 12 mL/min/{1.73_m2} — ABNORMAL LOW (ref >=60)
GLUCOSE RANDOM: 134 mg/dL (ref 70–179)
GLUCOSE RANDOM: 145 mg/dL (ref 70–179)
POTASSIUM: 5.2 mmol/L — ABNORMAL HIGH (ref 3.5–5.0)
POTASSIUM: 5.6 mmol/L — ABNORMAL HIGH (ref 3.5–5.0)
POTASSIUM: 5.8 mmol/L — ABNORMAL HIGH (ref 3.5–5.0)
SODIUM: 136 mmol/L (ref 135–145)
SODIUM: 136 mmol/L (ref 135–145)
SODIUM: 137 mmol/L (ref 135–145)

## 2018-05-14 LAB — MEAN PLATELET VOLUME: Lab: 8.2

## 2018-05-14 LAB — CBC
HEMATOCRIT: 33.8 % — ABNORMAL LOW (ref 41.0–53.0)
HEMATOCRIT: 34.5 % — ABNORMAL LOW (ref 41.0–53.0)
HEMOGLOBIN: 10.5 g/dL — ABNORMAL LOW (ref 13.5–17.5)
HEMOGLOBIN: 10.8 g/dL — ABNORMAL LOW (ref 13.5–17.5)
MEAN CORPUSCULAR HEMOGLOBIN CONC: 31 g/dL (ref 31.0–37.0)
MEAN CORPUSCULAR HEMOGLOBIN CONC: 31.4 g/dL (ref 31.0–37.0)
MEAN CORPUSCULAR HEMOGLOBIN: 26.8 pg (ref 26.0–34.0)
MEAN CORPUSCULAR HEMOGLOBIN: 26.8 pg (ref 26.0–34.0)
MEAN CORPUSCULAR VOLUME: 85.3 fL (ref 80.0–100.0)
MEAN PLATELET VOLUME: 8.2 fL (ref 7.0–10.0)
MEAN PLATELET VOLUME: 8 fL (ref 7.0–10.0)
PLATELET COUNT: 244 10*9/L (ref 150–440)
PLATELET COUNT: 250 10*9/L (ref 150–440)
RED BLOOD CELL COUNT: 4.04 10*12/L — ABNORMAL LOW (ref 4.50–5.90)
RED CELL DISTRIBUTION WIDTH: 15.8 % — ABNORMAL HIGH (ref 12.0–15.0)
WBC ADJUSTED: 10.3 10*9/L (ref 4.5–11.0)

## 2018-05-14 LAB — CO2: Carbon dioxide:SCnc:Pt:Ser/Plas:Qn:: 17 — ABNORMAL LOW

## 2018-05-14 LAB — VITAMIN D, TOTAL (25OH): Lab: 7.6 — ABNORMAL LOW

## 2018-05-14 LAB — MEAN CORPUSCULAR HEMOGLOBIN: Lab: 26.8

## 2018-05-14 LAB — MAGNESIUM
Magnesium:MCnc:Pt:Ser/Plas:Qn:: 2.1
Magnesium:MCnc:Pt:Ser/Plas:Qn:: 2.2

## 2018-05-14 LAB — CREATININE: Creatinine:MCnc:Pt:Ser/Plas:Qn:: 5.31 — ABNORMAL HIGH

## 2018-05-14 LAB — EGFR CKD-EPI AA MALE: Lab: 14 — ABNORMAL LOW

## 2018-05-14 NOTE — Unmapped (Signed)
Social Work  Psychosocial Assessment      SW made contact with pt wife, [name] Sorrels at [phone]. Pt wife reports that they were evicted from their home on yesterday through the courts. Pt wife did not supply the excuse letter provided by CM at [facility]. Pt wife reports it did not matter and that the landlord just wants the money owed of $1,100. Pt wife also reports she can pay $300 to appeal it but that they do not have the money for this.  Pt wife stated that they have 10 days to move out of the home.   Pt was just approved for Medicaid but has not yet received his card Pt wife is unemployed and pt does receive SSDI. He receives $871 per month. He also gets $30 in food stamps. They have a car and pay $180 every 2 weeks for the car. The rent was $550 per month. DSS assisted them recently with the light bill but could not assist them with any rent payment.  SW asked if pt and she could stay with family or friends. Per wife, they have no where to stay. SW advised that if no other place to stay a shelter could be considered. Pt wife adamantly refused shelter placement and reports she and her husband cannot be separated. Pt wife became upset and frustrated that SW unable to offer housing. SW explained there are no funds established for this type of service. Pt wife reports this is not helpful for their situation.  SW met with pt at bedside to discuss d/c planning. Per pt, he is aware of the eviction and reports no confirmed living arrangements. Pt reports that they maybe able to stay with their daughter. Pt reports a good support system of friends and family. He reports that his friend provides him a good deal to store his things in a storage unit. Pt also reports that if they had to he and his wife could also stay at the storage unit. Pt does not believe he will need any HH or rehab services at d/c. Pt expects to d/c tomorrow.     Patient Name: Jeremy Oliver   Medical Record Number: [id number]   Date of Birth: 1970-08-31  Sex: Male     Referral  Referred by: Care Manager  Reason for Referral: Homeless Patient / Family  Comment: social work for DC planning - per HB, pt maybe homeless(Populated via order specific question [id number] - OTHER - FOR IP CONSULT TO SOCIAL WORK.)    Extended Emergency Contact Information  Primary Emergency Contact: Fannin,[name]  Address: [address] [place], [place] [address] [place]  Home Phone: [phone]  Mobile Phone: [phone]  Relation: Spouse  Secondary Emergency Contact: Lyvers,Bridgette   [place]  Home Phone: [phone]  Mobile Phone: [phone]  Relation: Daughter    Legal Next of Kin / Guardian / POA / Advance Directives      Advance Directive (Medical Treatment)  Does patient have an advance directive covering medical treatment?: Patient has advance directive covering medical treatment, copy in chart.  Information provided on advance directive:: No  Patient requests assistance:: No    Advance Directive (Mental Health Treatment)  Does patient have an advance directive covering mental health treatment?: Patient does not have advance directive covering mental health treatment.    Discharge Planning  Discharge Planning Information:   Type of Residence  Mailing Address:  [address]  [place]  [place] [address]    Medical Information   Past Medical History:   Diagnosis Date   ??? Angina at rest (CMS-[name])    ??? Arthritis    ??? Asthma    ??? Atrial fibrillation and flutter (CMS-[name]) 06/08/14   ??? BMI 40.0-44.9, adult (CMS-[name]) 10/29/2015   ??? Chronic anticoagulation 02/17/2018   ??? Chronic kidney disease, stage 3 (CMS-[name])    ??? Chronic pain syndrome 04/11/2014    ~Use daily lyrica as recommended ~Tramadol as needed for pain ~Fluoxetine daily as recommended ~Daily exercise ~Eat a healthy diet ~Good control of your blood sugars can help    ??? Coronary artery disease    ??? Depression    ??? Diabetes mellitus (CMS-[name])    ??? Eye trauma 1998 glass in both eyes and removed   ??? GERD (gastroesophageal reflux disease)    ??? Gout    ??? Homeless    ??? Hyperlipidemia    ??? Hypertension    ??? Illiteracy and low-level literacy    ??? Microscopic hematuria    ??? Migraine    ??? Nephrolithiasis    ??? 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 Nonalcoholic fatty liver disease    ??? NSTEMI (non-ST elevated myocardial infarction) (CMS-[name]) 01/13/2017   ??? Obesity    ??? Obstructive sleep apnea 2009   ??? Panic attacks    ??? 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 Seizure-like activity (CMS-[name])     ~Monitor for symptoms and report them to your primary care provider if they occur    ??? Systolic CHF, chronic (CMS-[name])     EF 40-45% 2017   ??? TIA (transient ischemic attack)        Past Surgical History:   Procedure Laterality Date   ??? CARDIAC CATHETERIZATION  03/08/2007   ??? CYSTOURETHROSCOPY  12/31/2009     Microscopic hematuria   ??? KNEE SURGERY Right 09/23/2006    arthroscopic knee surgery medial and lateral meniscus tears as well as crystal disease   ??? PR CATH PLACE/CORON ANGIO, IMG SUPER/INTERP,R&L HRT CATH, L HRT VENTRIC N/A 02/13/2017    Procedure: Left/Right Heart Catheterization;  Surgeon: Marlaine Hind, MD;  Location: [facility] CATH;  Service: Cardiology   ??? PR CATH PLACE/CORON ANGIO, IMG SUPER/INTERP,W LEFT HEART VENTRICULOGRAPHY N/A 03/17/2017    Procedure: Left Heart Catheterization W Intervention;  Surgeon: Marlaine Hind, MD;  Location: [facility] CATH;  Service: Cardiology   ??? PR CATH PLACE/CORON ANGIO, IMG SUPER/INTERP,W LEFT HEART VENTRICULOGRAPHY N/A 05/12/2018    Procedure: CATH LEFT HEART CATHETERIZATION W INTERVENTION;  Surgeon: Dorathy Kinsman, MD;  Location: [facility] CATH;  Service: Cardiology   ??? PR ECMO/ECLS INITIATION VENO-ARTERIAL N/A 03/19/2017    Procedure: ECMO/ECLS; INITIATION, VENO-ARTERIAL;  Surgeon: Lennie Odor, MD;  Location: MAIN OR [facility];  Service: Cardiac Surgery   ??? PR ECMO/ECLS RMVL PRPH CANNULA OPEN 6 YRS & OLDER Left 03/25/2017    Procedure: ECMO / ECLS PROVIDED BY PHYSICIAN; REMOVAL OF PERIPHERAL (ARTERIAL AND/OR VENOUS) CANNULA(E), OPEN, 6 YEARS  AND OLDER;  Surgeon: Loletta Specter, MD;  Location: MAIN OR [facility];  Service: Cardiac Surgery   ??? PR EPHYS EVAL W/ ABLATION SUPRAVENT ARRHYTHMIA N/A 07/19/2015    Catheter ablation of cavotricuspid isthmus for atrial flutter [facility])   ??? PR INSER HART PACER XVENOUS ATRIAL N/A 05/30/2015    [employer] dual-chamber pacemaker implant (E.Chung)   ??? PR INSERT/PLACE FLOW DIRECT CATH N/A 03/17/2017    Procedure: Insert Leave In [place];  Surgeon: Marlaine Hind, MD;  Location: [facility] CATH;  Service: Cardiology   ??? PR INSJ/RPLCMT PERM DFB W/TRNSVNS LDS 1/DUAL CHMBR N/A 05/04/2017    Procedure: ICD Implant System (Single/Dual);  Surgeon: Eldred Manges, MD;  Location: [facility] CATH;  Service: Cardiology   ??? PR NEGATIVE PRESSURE WOUND THERAPY DME >50 SQ CM Left 03/25/2017    Procedure: Neg Press Wound Tx (Vac Assist) Incl Topicals, Per Session, Tsa Greater Than/= 50 Cm Squared;  Surgeon: Alonna Buckler Ikonomidis, MD;  Location: MAIN OR [facility];  Service: Cardiac Surgery   ??? PR PRQ TRLUML CORONARY STENT W/ANGIO ONE ART/BRNCH N/A 03/12/2018    Procedure: Percutaneous Coronary Intervention;  Surgeon: Marlaine Hind, MD;  Location: [facility] CATH;  Service: Cardiology   ??? PR REBL VES DIRECT,LOW EXTREM Left 03/19/2017    Procedure: Repr Bld Vessel Direct; Lower Extrem;  Surgeon: Boykin Reaper, MD;  Location: MAIN OR [name];  Service: Vascular   ??? PR REMV ART CLOT ILIAC-POP,LEG INCIS Left 03/25/2017    Procedure: EMBOLECTOMY OR THROMBECTOMY, WITH OR WITHOUT CATHETER; FEMOROPOPLITEAL, AORTOILIAC ARTERY, BY LEG INCISION;  Surgeon: Alonna Buckler Ikonomidis, MD;  Location: MAIN OR [facility];  Service: Cardiac Surgery   ??? PR UPPER GI ENDOSCOPY,BIOPSY N/A 02/28/2016    Procedure: UGI ENDOSCOPY; WITH BIOPSY, SINGLE OR MULTIPLE;  Surgeon: Liane Comber, MD;  [facility];  Service: Gastroenterology   ??? PR UPPER GI ENDOSCOPY,DIAGNOSIS N/A 05/07/2016    Procedure: UGI ENDO, INCLUDE ESOPHAGUS, STOMACH, & DUODENUM &/OR JEJUNUM; DX W/WO COLLECTION SPECIMN, BY BRUSH OR WASH;  Surgeon: Modena Nunnery, MD;  [facility];  Service: Gastroenterology       Family History   Problem Relation Age of Onset   ??? Diabetes Mother    ??? Hyperlipidemia Mother    ??? Heart disease Mother    ??? Basal cell carcinoma Mother    ??? Blindness Mother         diabeties   ??? Obesity Mother    ??? Hyperlipidemia Father    ??? Heart disease Father    ??? Lung disease Father    ??? Heart disease Half-Sister    ??? Kidney disease Half-Sister    ??? Gallbladder disease Half-Brother    ??? Obesity Half-Brother    ??? No Known Problems Daughter    ??? No Known Problems Son    ??? Obesity Daughter    ??? Melanoma Neg Hx    ??? Squamous cell carcinoma Neg Hx    ??? Macular degeneration Neg Hx    ??? Glaucoma Neg Hx        Financial Information   Primary Insurance: Payor: MEDICARE / Plan: MEDICARE PART A AND PART B / Product Type: *No Product type* /    Secondary Insurance: None   Prescription Coverage: Medicaid   Preferred Pharmacy: [facility] - [place], [place] - [address]  [facility]  [facility] - [place] - [address]  [facility] [place] - [address]  [facility] - [place], [place] - [address]  [facility]  [facility]    Barriers to taking medication: No Transition Home   Transportation at time of discharge: Family/Friend's Private Vehicle    Anticipated changes related to Illness: none   Services in place prior to admission: N/A   Services anticipated for DC: N/A   Hemodialysis Prior to Admission: No    Readmission  Risk of Unplanned Readmission Score: UNPLANNED READMISSION SCORE: 42%  Readmitted Within the Last 30 Days?   Readmission Factors include: unable to assess    Social Determinants of Health  Social Determinants of Health were addressed in provider documentation.  Please refer to patient history.    Social History  Support Systems: Family Members, Children                    Medical and Psychiatric History  Psychosocial Stressors: Coping with health challenges/recent hospitalization, Financial concerns      Psychological Issues/Information: No issues            Legal: Comment   Comment: Eviction court date was on 2/12  Ability to [employer]: Inadequate/unavailable services

## 2018-05-14 NOTE — Unmapped (Signed)
Patient is alert and oriented times 4. Vital signs are stable. All medications given per order. Pt denies pain at present. Falls precaution maintained. Bed is in lowest position and call bell is within reach for safety. Stand by assist.      Problem: Adult Inpatient Plan of Care  Goal: Plan of Care Review  Outcome: Progressing  Goal: Patient-Specific Goal (Individualization)  Outcome: Progressing  Goal: Absence of Hospital-Acquired Illness or Injury  Outcome: Progressing  Goal: Optimal Comfort and Wellbeing  Outcome: Progressing  Goal: Readiness for Transition of Care  Outcome: Progressing  Goal: Rounds/Family Conference  Outcome: Progressing     Problem: Wound  Goal: Optimal Wound Healing  Outcome: Progressing     Problem: Fall Injury Risk  Goal: Absence of Fall and Fall-Related Injury  Outcome: Progressing     Problem: Self-Care Deficit  Goal: Improved Ability to Complete Activities of Daily Living  Outcome: Progressing     Problem: Diabetes Comorbidity  Goal: Blood Glucose Level Within Desired Range  Outcome: Progressing     Problem: Hypertension Comorbidity  Goal: Blood Pressure in Desired Range  Outcome: Progressing     Problem: Pain Chronic (Persistent) (Comorbidity Management)  Goal: Acceptable Pain Control and Functional Ability  Outcome: Progressing

## 2018-05-14 NOTE — Unmapped (Addendum)
Jeremy Oliveris a 48 y.o.??male??with hx of CAD, STEMI in 2018 complicated by cardiogenic shock following VT arrest with emergent PCI to mid LAD,??Ischemic cardiomyopathy??with ICD [facility]),??CKD, paroxysmal afib, HTN,??HLD,??T2DM, and morbid obesity. He presents to [facility] ED on 02/11 as a transfer from [facility] for workup of NSTEMI.     NSTEMI:??Troponin elevated and peaked at 1.09 on 02/11. Patient with significant CAD history and has known stenosis in multiple vessels. The rise in Troponin with no ST segment elevation on EKG is c/w NSTEMI. Patient is now s/p LHC (05/12/2018), with successful PCI to the proximal to mid LAD with the placement of a Synergy drug eluting stent with excellent angiographic result and TIMI 3 flow.   In the setting of Cr bump post heart cath contrast, we are holding losartan until seen outpatient next week. An echocardiogram was done on 05/13/2018 and showed mildly decreased left ventricular systolic function, ejection fraction 45 to 50%. We continued the following medications:  - Coreg 25 mg daily and 50 mg nightly  - Xarelto 15 mg daily (home dose)  - Brilinta 90 mg BID (home dose)  - Atorvastatin 80 mg daily  - holding losartan   ??  Hypertension:??Pressures at [facility] were in the 200s systolic. Pressures here on admission have been in the 160s-170s systolic.??Blood pressure is now better controlled with PO meds.    - Coreg 25 mg??in morning and 50 mg at night  - amlodipine 10 mg daily  -??continue??Hydralazine 25 mg TID, Imdur 30 mg and losartan 50mg  daily   - hold losartan on dc given AKI on CKD    Paroxsymal A. Fib: He has a history of Paroxysmal Afib with ICD placement. Device was interrogated recently and was found to have <1% afib burden. His home dose of Amiodarone is 200 mg daily and seems to be controlling Afib well. TSH was elevated on admission to 5.060 with normal T4 at 1.10.   - continue home Amiodarone 200 mg daily  ??  AKI on CKD:??Cr at baseline on admission, but bumped to peak of 5.8 on 05/16/2018, felt 2/2 contrast induced nephropathy following LHC/PCI. Nephrology was consulted. Losartan was held. Dialysis was not required. His renal function downtrended to 5.4 on 2/17.   -hold losartan at discharge   -repeat labs on 05/18/2018 as outpatient to ensure continued downtrending  ??  T2DMl: Hx of T2DM, glucose elvated to 485 on admission to [place]. Home lantus continued.   ??  Hyperlipidemia: Lipids on admission remarkable for triglycerides 733, total cholesterol 229, HDL 27.his home medications include Praluent inject q14 days, and zetia. He just received his praluent shot a few days ago so will continue his atorvastatin 80 mg daily and Zetia 10 mg.

## 2018-05-14 NOTE — Unmapped (Signed)
Advanced Heart Failure/Transplant/LVAD (MDD) Cardiology Progress Note    Patient Name: Jeremy Oliver  MRN: [id number]  Date of Service: May 14, 2018     Reason for Admission:   Jeremy Oliver is a 48 y.o. male with hx of CAD, STEMI in 2018 complicated by cardiogenic shock following VT arrest with emergent PCI to mid LAD,??ischemic cardiomyopathy??with ICD [facility]),??CKD, paroxysmal afib, HTN,??HLD,??T2DM, and morbid obesity. He presents to [facility] ED on 02/11 as a transfer from [facility] for workup of NSTEMI.      Assessment and Plan:     NSTEMI: Troponin elevated and peaked at 1.09 on 02/11. Patient with significant CAD history and has known stenosis in multiple vessels. The rise in Troponin with no ST segment elevation on EKG is c/w NSTEMI. Patient is now s/p LHC with stent to LAD (02/12). Troponin decreased to 0.409 yesterday evening.   - holding losartan until seen outpatient next week (in the setting of Cr bump post contrast)  - Coreg 25 mg daily and 50 mg nightly  - Xarelto 15 mg daily (home dose)  - Brilinta 90 mg BID (home dose)  - Atorvastatin 80 mg daily   -telemetry    -Echo - positive for newly decreased LVEF of 45-50% with mild LV hypertrophy and anteroseptal segmental wall motion abnormality.   ??  Hypertension: Pressures at [facility] were in the 200s systolic. Pressures here on admission have been in the 160s-170s systolic. Blood pressure is now better controlled with PO meds.    - Continue Norvasc 10mg .   - Coreg 25 mg in morning and 50 mg at night  - amlodipine 10 mg daily  - continue Hydralazine 25 mg TID, Imdur 30 mg and losartan 50mg  daily (Losartan held on 02/13 with Cr bump and to be resumed per outpatient assessment with cards next week)    Paroxsymal A. Fib: amiodarone keeping afib under control. TSH elevated to 5.06 yesterday but T4 is normal at 1.10. Recommend continuing amio at this time and then following Thyroid function at outpatient follow-up.  - continue home Amiodarone 200 mg daily  ??  CKD: Cr at baseline on admission, but bumped to 5.05, up from 4.18 last night s/p LHC contrast. Cr has continued to rise. His echo showed EF of 45-50% with LVEDP of 8 during the cath. With these values we feel that he could handle some more IVFs to flush out his kidneys.   - IVFs NS 150 cc/hour today  - PM chem 10 labs to monitor Cr and electrolytes  - continue to monitor  ??  T2DMl: Hx of T2DM, glucose elvated to 485 on admission to [place]. Glucose trending downward to 272 upon recheck. Glucose this AM was 168. Will continue him on Lantus 25 BID and sliding scale insulin as sugars have been much better controlled.  - Lantus 25 BID  - sliding scale insulin     Hyperlipidemia: Lipids on admission remarkable for triglycerides 733, total cholesterol 229, HDL 27.his home medications include Praluent inject q14 days, and zetia. He just received his praluent shot a few days ago so will continue his atorvastatin 80 mg daily and Zetia 10 mg. Triglycerides on recheck were 654. We feel that at this level, he is not at risk for acute pancreatitis but should definitely be followed as outpatient.   -cont Zetia 10 mg and atorvastatin 80 mg  -Restart Praluent q14 days on discharge  ??  Left Shoulder pain: Quite tender to palpation on exam, [place] be  some MSK component to ongoing pain but unable to do a full exam due to pain. Received lidocaine patch. Pain worsening after syncopal event so more likely to be cardiac etiology at this point. Will continue to monitor   ??- lidocaine patch  ??  #) Prophylaxis: on anticoagulation PO meds   #) Code Status: FULL CODE verified on admission  ??  -------------------------------------------------------------------------------------------------------------------     Interval History/Subjective:     He did not sleep well overnight, as he states that he had the feeling of cramping in his legs but denies any chest pain, or SOB. He also noted that he felt like his left foot was cold last night but upon examination this morning it was warm and well perfused with palpable pulse. We have been trending his Cr as it was elevated last night to 4.18 and this AM it was elevated to 5.05 s/p 1 L of fluids. We strongly recommended that he stay another night so we could monitor his kidney function and when we told him he should stay today, he threatened to leave AMA by 5pm tonight even if his kidney function is not better. Upon talking with Dr. Andrey Farmer, we all believe he should stay and Dr. Andrey Farmer talked to him today and he agreed to stay. We added flexeril (which he took previously) to help with leg cramps       Objective:     Physical Examination:  Temp:  [36.4 ??C (97.6 ??F)-36.5 ??C (97.7 ??F)] 36.4 ??C (97.6 ??F)  Heart Rate:  [70-88] 80  Resp:  [18-19] 18  BP: (114-146)/(59-70) 143/67  MAP (mmHg):  [81-99] 96  SpO2:  [97 %-98 %] 97 %  Height: 188 cm (6' 2)     Body mass index is 44.07 kg/m??.  Wt Readings from Last 3 Encounters:   05/13/18 (!) 155.7 kg (343 lb 4.1 oz)   05/11/18 (!) 158.9 kg (350 lb 6.4 oz)   04/23/18 (!) 161.7 kg (356 lb 6.4 oz)       General:  Sitting up in bed. Morbidly obese male. No acute distress  HEENT:  NCAT. MMM  Lungs: CTA B/L , no increased work of breathing  CV: RRR  Abd: Soft, NT. Mildly distended  NGE:XBMW and well perfused  Neuro: awake, alert and oriented x3    I/O last 3 completed shifts:  In: 480 [P.O.:480]  Out: 553 [Urine:552; Stool:1]    Labs & Imaging:  Reviewed in EPIC.   Lab Results   Component Value Date    WBC 10.3 05/14/2018    HGB 10.5 (L) 05/14/2018    HCT 33.8 (L) 05/14/2018    PLT 244 05/14/2018     Lab Results   Component Value Date    NA 136 05/14/2018    K 5.2 (H) 05/14/2018    CL 107 05/14/2018    CO2 20.0 (L) 05/14/2018    BUN 43 (H) 05/14/2018    CREATININE 5.05 (H) 05/14/2018    GLU 134 05/14/2018    CALCIUM 8.7 05/14/2018    MG 2.2 05/14/2018    PHOS 5.2 (H) 05/13/2018

## 2018-05-14 NOTE — Unmapped (Signed)
I was the supervising physician in the delivery of the service. Sreekar Broyhill D Letti Towell, MD

## 2018-05-14 NOTE — Unmapped (Signed)
Pt is AAOx4.  VSS: Blood pressure 139/73, pulse 87, temperature 37.2 ??C (98.9 ??F), temperature source Oral, resp. rate 18, height 188 cm (6' 2), weight (!) 155.5 kg (342 lb 12.8 oz), SpO2 97 %.  Pt started on IV fluids this shift.  Echocardiogram completed this shift.  Denies pain.  No falls/injuries this shift.  No new s/s infection this shift.  Bed is in lowest and locked position with side rails up x 2 while pt in bed.  No new skin breakdown noted this shift.  Will cont to monitor.    Problem: Adult Inpatient Plan of Care  Goal: Plan of Care Review  Outcome: Progressing  Goal: Patient-Specific Goal (Individualization)  Outcome: Progressing  Goal: Absence of Hospital-Acquired Illness or Injury  Outcome: Progressing  Goal: Optimal Comfort and Wellbeing  Outcome: Progressing  Goal: Readiness for Transition of Care  Outcome: Progressing  Goal: Rounds/Family Conference  Outcome: Progressing     Problem: Wound  Goal: Optimal Wound Healing  Outcome: Progressing     Problem: Fall Injury Risk  Goal: Absence of Fall and Fall-Related Injury  Outcome: Progressing     Problem: Self-Care Deficit  Goal: Improved Ability to Complete Activities of Daily Living  Outcome: Progressing     Problem: Diabetes Comorbidity  Goal: Blood Glucose Level Within Desired Range  Outcome: Progressing     Problem: Hypertension Comorbidity  Goal: Blood Pressure in Desired Range  Outcome: Progressing     Problem: Pain Chronic (Persistent) (Comorbidity Management)  Goal: Acceptable Pain Control and Functional Ability  Outcome: Progressing

## 2018-05-15 LAB — BASIC METABOLIC PANEL
ANION GAP: 11 mmol/L (ref 7–15)
ANION GAP: 11 mmol/L (ref 7–15)
BLOOD UREA NITROGEN: 43 mg/dL — ABNORMAL HIGH (ref 7–21)
BLOOD UREA NITROGEN: 44 mg/dL — ABNORMAL HIGH (ref 7–21)
BUN / CREAT RATIO: 8
CALCIUM: 8.5 mg/dL (ref 8.5–10.2)
CALCIUM: 8.5 mg/dL (ref 8.5–10.2)
CHLORIDE: 107 mmol/L (ref 98–107)
CHLORIDE: 108 mmol/L — ABNORMAL HIGH (ref 98–107)
CO2: 17 mmol/L — ABNORMAL LOW (ref 22.0–30.0)
CREATININE: 5.49 mg/dL — ABNORMAL HIGH (ref 0.70–1.30)
CREATININE: 5.62 mg/dL — ABNORMAL HIGH (ref 0.70–1.30)
EGFR CKD-EPI AA MALE: 13 mL/min/{1.73_m2} — ABNORMAL LOW (ref >=60)
EGFR CKD-EPI AA MALE: 13 mL/min/{1.73_m2} — ABNORMAL LOW (ref >=60)
EGFR CKD-EPI NON-AA MALE: 11 mL/min/{1.73_m2} — ABNORMAL LOW (ref >=60)
GLUCOSE RANDOM: 115 mg/dL (ref 70–179)
GLUCOSE RANDOM: 128 mg/dL (ref 70–179)
POTASSIUM: 5.1 mmol/L — ABNORMAL HIGH (ref 3.5–5.0)
POTASSIUM: 5 mmol/L (ref 3.5–5.0)
SODIUM: 134 mmol/L — ABNORMAL LOW (ref 135–145)
SODIUM: 136 mmol/L (ref 135–145)

## 2018-05-15 LAB — PHOSPHORUS: Phosphate:MCnc:Pt:Ser/Plas:Qn:: 5.7 — ABNORMAL HIGH

## 2018-05-15 LAB — CBC
HEMATOCRIT: 31.2 % — ABNORMAL LOW (ref 41.0–53.0)
HEMOGLOBIN: 10 g/dL — ABNORMAL LOW (ref 13.5–17.5)
MEAN CORPUSCULAR HEMOGLOBIN CONC: 32.1 g/dL (ref 31.0–37.0)
MEAN CORPUSCULAR HEMOGLOBIN: 27.5 pg (ref 26.0–34.0)
MEAN PLATELET VOLUME: 8.5 fL (ref 7.0–10.0)
PLATELET COUNT: 231 10*9/L (ref 150–440)
RED BLOOD CELL COUNT: 3.64 10*12/L — ABNORMAL LOW (ref 4.50–5.90)
RED CELL DISTRIBUTION WIDTH: 15.5 % — ABNORMAL HIGH (ref 12.0–15.0)

## 2018-05-15 LAB — MAGNESIUM
Magnesium:MCnc:Pt:Ser/Plas:Qn:: 1.9
Magnesium:MCnc:Pt:Ser/Plas:Qn:: 2

## 2018-05-15 LAB — CO2: Carbon dioxide:SCnc:Pt:Ser/Plas:Qn:: 16 — ABNORMAL LOW

## 2018-05-15 LAB — BLOOD UREA NITROGEN: Urea nitrogen:MCnc:Pt:Ser/Plas:Qn:: 44 — ABNORMAL HIGH

## 2018-05-15 LAB — MEAN PLATELET VOLUME: Lab: 8.5

## 2018-05-15 NOTE — Unmapped (Signed)
DAILY INTERIM LONGTERM VIDEO EEG MONITORING NOTE     REPORT NOT YET FINAL - Please see final report under PROCEDURE tab in CHART REVIEW when study completed    Patient: Jeremy Oliver  Date of Birth: January 12, 1971  Attending: Asher Muir, M.D.  Ordering Provider: Chong Sicilian                                  [facility] No:  [id number]      HISTORY   48 y.o. male with HTN,??HLD,??T2DM, morbid obesity, CKD, CAD, STEMI in 2018 with VT arrest,??ischemic cardiomyopathy??with ICD and paroxysmal afib was admitted on 05/11/2018 with NSTEMI.   On 05/14/2018 he had acute chest pain and episodes of brief staring and difficulty following commands.    PROCEDURE:   Continuous EEG with simultaneous video recording was performed utilizing 21 active electrodes placed according to the international 10-20 system.  The study was recorded digitally with a bandpass of 1-70Hz  and a sampling rate of 200Hz  and was reviewed with the possibility of multiple reformatting.  The study was digitally processed with potential spike and seizure events identified for physician analysis and review.  Patient recognized events were identified by a push button marker and reviewed by the physician.   Simultaneous video was reviewed for all patient events.      TECHNICAL DESCRIPTION   05/14/2018 at 22: 41 to 05/15/2018 at 08: 00  During maximal wakefulness the posterior dominant rhythm is 9 Hz with a 50 ??V amplitude.  The background is symmetric and composed of an admixture of alpha and theta range activity.  There are brief periods of drowsiness identified by loss of muscle artifact and slowing of the background, but no cycling into deeper stages of sleep.    No epileptiform activity is identified and no events are reported.      IMPRESSION   Normal continuous EEG monitoring with video.    CLINICAL INTERPRETATION   A normal study does not exclude seizures or epilepsy.

## 2018-05-15 NOTE — Unmapped (Signed)
Problem: Adult Inpatient Plan of Care  Goal: Plan of Care Review  Outcome: Progressing  Goal: Patient-Specific Goal (Individualization)  Outcome: Progressing  Goal: Absence of Hospital-Acquired Illness or Injury  Outcome: Progressing  Goal: Optimal Comfort and Wellbeing  Outcome: Progressing  Goal: Readiness for Transition of Care  Outcome: Progressing  Goal: Rounds/Family Conference  Outcome: Progressing     Problem: Wound  Goal: Optimal Wound Healing  Outcome: Progressing     Problem: Fall Injury Risk  Goal: Absence of Fall and Fall-Related Injury  Outcome: Progressing     Problem: Self-Care Deficit  Goal: Improved Ability to Complete Activities of Daily Living  Outcome: Progressing     Problem: Diabetes Comorbidity  Goal: Blood Glucose Level Within Desired Range  Outcome: Progressing     Problem: Hypertension Comorbidity  Goal: Blood Pressure in Desired Range  Outcome: Progressing     Problem: Pain Chronic (Persistent) (Comorbidity Management)  Goal: Acceptable Pain Control and Functional Ability  Outcome: Progressing   Patient is alert and oriented, hemodynamically stable, sinus rhythm on monitor, c/o muscle spasm on left leg , muscle relaxant prn , iv fluid continuing, will continue to monitor .

## 2018-05-15 NOTE — Unmapped (Signed)
Problem: Adult Inpatient Plan of Care  Goal: Plan of Care Review  Outcome: Progressing  Goal: Patient-Specific Goal (Individualization)  Outcome: Progressing  Goal: Absence of Hospital-Acquired Illness or Injury  Outcome: Progressing  Goal: Optimal Comfort and Wellbeing  Outcome: Progressing  Goal: Readiness for Transition of Care  Outcome: Progressing  Goal: Rounds/Family Conference  Outcome: Progressing     Problem: Wound  Goal: Optimal Wound Healing  Outcome: Progressing     Problem: Fall Injury Risk  Goal: Absence of Fall and Fall-Related Injury  Outcome: Progressing     Problem: Self-Care Deficit  Goal: Improved Ability to Complete Activities of Daily Living  Outcome: Progressing     Problem: Diabetes Comorbidity  Goal: Blood Glucose Level Within Desired Range  Outcome: Progressing     Problem: Hypertension Comorbidity  Goal: Blood Pressure in Desired Range  Outcome: Progressing     Problem: Pain Chronic (Persistent) (Comorbidity Management)  Goal: Acceptable Pain Control and Functional Ability  Outcome: Progressing  A-paced on tele monitor. Flexeril given to manage muscle spasm this shift, all medication administered as ordered, NS infusing at 131mL/hr. Trend renal function. Pt is resting in bed at present, call bell within reach. Will continue to monitor closely.

## 2018-05-15 NOTE — Unmapped (Signed)
Initial Consult Note        Requesting Attending Physician:  Vanetta Mulders, MD  Service Requesting Consult: Cardiology [facility])     Assessment and Plan          Jeremy Oliver is a 48 y.o. male with hx of CAD, STEMI in 2018 complicated by cardiogenic shock following VT arrest with emergent PCI to mid LAD,??ischemic cardiomyopathy??with ICD [facility]),??CKD, paroxysmal afib, HTN,??HLD,??T2DM, and morbid obesity.  on whom I have been asked by Vanetta Mulders, MD to consult for altered mental status.      Altered mental status: The patient has had multiple episodes of altered mental status starting this evening.  From description, it is possible that the patient had seizure activity causing transient alteration in consciousness.  However, when he was observed during 1 of these events, and he would repeatedly avoid his face with hand drop suggesting a functional exam.  Also, given his cardiac history with recent cath and stent placement as well as a history of atrial fibrillation, it is possible that the patient had arrhythmias causing alteration in consciousness.  He does not appear to have any new weakness on examination.  He states that his left lower extremity weakness and numbness is unchanged from his baseline and has been present for years.     -Recommend telemetry monitoring  -Continuous video EEG      Dr. Seward Meth was available.    Felizardo Hoffmann, MD  PGY-4 Resident, Department of Neurology        HPI        Reason for Consult: Altered mental status    Jeremy Oliver is a 48 y.o. male with hx of CAD, STEMI in 2018 complicated by cardiogenic shock following VT arrest with emergent PCI to mid LAD,??ischemic cardiomyopathy??with ICD [facility]),??CKD, paroxysmal afib, HTN,??HLD,??T2DM, and morbid obesity.  on whom I have been asked by Vanetta Mulders, MD to consult for altered mental status.    The patient was admitted here after NSTEMI and is now status post LHC and stent placement.  Per primary team, he reportedly had acute chest pain and a rapid was called.  He was then noted to be minimally responsive and noted to be staring straight ahead for several minutes prior to returning to baseline.  He had several more episodes lasting several seconds where he would be noted to stare straight ahead and have difficulty with following commands.  The patient would return to baseline between these episodes.  The patient states that he had no preceding warning, lightheadedness, palpitations, or chest pain prior to loss of consciousness.  He states that he was sitting on his bed and getting ready to take a shower but had not stood up.  He had loss of consciousness and does not remember anything until waking up with people around him.  He states that he was confused for a few minutes after before coming back to baseline.  He denies any incontinence and states that he feels weak all over.  However, he denies any new unilateral weakness.  He also states that he has the same chronic fingertip numbness but no changes to this.  He denies any new numbness, speech difficulty, or vision changes.      Allergies   Allergen Reactions   ??? Morphine Palpitations     Medication error: was administered 20mL instead of 2mL          Current Facility-Administered Medications   Medication Dose Route  Frequency Provider Last Rate Last Dose   ??? acetaminophen (TYLENOL) tablet 650 mg  650 mg Oral Q4H PRN Hinda Lenis, MD   650 mg at 05/14/18 0403   ??? albuterol (PROVENTIL HFA;VENTOLIN HFA) 90 mcg/actuation inhaler 2 puff  2 puff Inhalation Q6H PRN Hinda Lenis, MD       ??? amiodarone (PACERONE) tablet 200 mg  200 mg Oral Daily Hinda Lenis, MD   200 mg at 05/14/18 9604   ??? amLODIPine (NORVASC) tablet 10 mg  10 mg Oral Daily Hinda Lenis, MD   10 mg at 05/14/18 5409   ??? atorvastatin (LIPITOR) tablet 80 mg  80 mg Oral Daily Hinda Lenis, MD   80 mg at 05/14/18 0809   ??? carvediloL (COREG) tablet 25 mg  25 mg Oral QAM AC Hinda Lenis, MD   25 mg at 05/14/18 8119   ??? carvediloL (COREG) tablet 50 mg  50 mg Oral Nightly Hinda Lenis, MD   50 mg at 05/13/18 2007   ??? cyclobenzaprine (FLEXERIL) tablet 5 mg  5 mg Oral TID PRN Amira Choucair   5 mg at 05/14/18 1412   ??? dextrose (D10W) 10% bolus 125 mL  12.5 g Intravenous Q30 Min PRN Hinda Lenis, MD       ??? ezetimibe (ZETIA) tablet 10 mg  10 mg Oral Nightly Hinda Lenis, MD   10 mg at 05/13/18 2007   ??? FLUoxetine (PROzac) capsule 60 mg  60 mg Oral Nightly Hinda Lenis, MD   60 mg at 05/13/18 2030   ??? hydrALAZINE (APRESOLINE) tablet 25 mg  25 mg Oral TID Hinda Lenis, MD   25 mg at 05/14/18 1413   ??? insulin glargine (LANTUS) injection 25 Units  25 Units Subcutaneous BID Amira Choucair   25 Units at 05/14/18 0809   ??? insulin regular (HumuLIN,NovoLIN) injection 0-12 Units  0-12 Units Subcutaneous ACHS Hinda Lenis, MD   2 Units at 05/14/18 1133   ??? isosorbide mononitrate (IMDUR) 24 hr tablet 30 mg  30 mg Oral Daily Hinda Lenis, MD   30 mg at 05/14/18 1478   ??? lidocaine (LIDODERM) 5 % patch 1 patch  1 patch Transdermal Daily Amira Choucair   1 patch at 05/14/18 0809   ??? melatonin tablet 9 mg  9 mg Oral Nightly Hinda Lenis, MD   9 mg at 05/13/18 2006   ??? nitroglycerin (NITROSTAT) SL tablet 0.4 mg  0.4 mg Sublingual Q5 Min PRN Hinda Lenis, MD   0.4 mg at 05/14/18 1852   ??? nitroglycerin (NITROSTAT) SL tablet   Sublingual Code/Trauma/Sedation Med Maxwell Caul Chilcutt, MD   0.4 mg at 05/14/18 1859   ??? ondansetron (ZOFRAN-ODT) disintegrating tablet 4 mg  4 mg Oral Q12H PRN Hinda Lenis, MD   4 mg at 05/14/18 1843   ??? polyethylene glycol (MIRALAX) packet 17 g  17 g Oral Daily Hinda Lenis, MD   17 g at 05/12/18 0813   ??? rivaroxaban (XARELTO) tablet 15 mg  15 mg Oral Daily Amira Choucair   15 mg at 05/14/18 1826   ??? senna (SENOKOT) tablet 2 tablet  2 tablet Oral Nightly Hinda Lenis, MD   2 tablet at 05/13/18 2007   ??? sevelamer (RENVELA) tablet 1,600 mg  1,600 mg Oral 3xd Meals Hinda Lenis, MD   1,600 mg at 05/14/18 1800   ??? sodium chloride (NS) 0.9 % infusion  100 mL/hr Intravenous Continuous Amira Choucair 100 mL/hr at 05/14/18 1615 100 mL/hr at 05/14/18 1615   ???  sodium polystyrene sulfonate (SPS) oral suspension  30 g Oral Once Hinda Lenis, MD       ??? ticagrelor (BRILINTA) tablet 90 mg  90 mg Oral BID Hinda Lenis, MD   90 mg at 05/14/18 0808       Past Medical History:   Diagnosis Date   ??? Angina at rest (CMS-[name])    ??? Arthritis    ??? Asthma    ??? Atrial fibrillation and flutter (CMS-[name]) 06/08/14   ??? BMI 40.0-44.9, adult (CMS-[name]) 10/29/2015   ??? Chronic anticoagulation 02/17/2018   ??? Chronic kidney disease, stage 3 (CMS-[name])    ??? Chronic pain syndrome 04/11/2014    ~Use daily lyrica as recommended ~Tramadol as needed for pain ~Fluoxetine daily as recommended ~Daily exercise ~Eat a healthy diet ~Good control of your blood sugars can help    ??? Coronary artery disease    ??? Depression    ??? Diabetes mellitus (CMS-[name])    ??? Eye trauma 1998    glass in both eyes and removed   ??? GERD (gastroesophageal reflux disease)    ??? Gout    ??? Homeless    ??? Hyperlipidemia    ??? Hypertension    ??? Illiteracy and low-level literacy    ??? Microscopic hematuria    ??? Migraine    ??? Nephrolithiasis    ??? 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 Nonalcoholic fatty liver disease    ??? NSTEMI (non-ST elevated myocardial infarction) (CMS-[name]) 01/13/2017   ??? Obesity    ??? Obstructive sleep apnea 2009   ??? Panic attacks    ??? 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 Seizure-like activity (CMS-[name])     ~Monitor for symptoms and report them to your primary care provider if they occur    ??? Systolic CHF, chronic (CMS-[name])     EF 40-45% 2017   ??? TIA (transient ischemic attack)        Past Surgical History:   Procedure Laterality Date   ??? CARDIAC CATHETERIZATION  03/08/2007   ??? CYSTOURETHROSCOPY  12/31/2009     Microscopic hematuria   ??? KNEE SURGERY Right 09/23/2006    arthroscopic knee surgery medial and lateral meniscus tears as well as crystal disease   ??? PR CATH PLACE/CORON ANGIO, IMG SUPER/INTERP,R&L HRT CATH, L HRT VENTRIC N/A 02/13/2017    Procedure: Left/Right Heart Catheterization;  Surgeon: Marlaine Hind, MD;  Location: [facility] CATH;  Service: Cardiology   ??? PR CATH PLACE/CORON ANGIO, IMG SUPER/INTERP,W LEFT HEART VENTRICULOGRAPHY N/A 03/17/2017    Procedure: Left Heart Catheterization W Intervention;  Surgeon: Marlaine Hind, MD;  Location: [facility] CATH;  Service: Cardiology   ??? PR CATH PLACE/CORON ANGIO, IMG SUPER/INTERP,W LEFT HEART VENTRICULOGRAPHY N/A 05/12/2018    Procedure: CATH LEFT HEART CATHETERIZATION W INTERVENTION;  Surgeon: Dorathy Kinsman, MD;  Location: [facility] CATH;  Service: Cardiology   ??? PR ECMO/ECLS INITIATION VENO-ARTERIAL N/A 03/19/2017    Procedure: ECMO/ECLS; INITIATION, VENO-ARTERIAL;  Surgeon: Lennie Odor, MD;  Location: MAIN OR [facility];  Service: Cardiac Surgery   ??? PR ECMO/ECLS RMVL  PRPH CANNULA OPEN 6 YRS & OLDER Left 03/25/2017    Procedure: ECMO / ECLS PROVIDED BY PHYSICIAN; REMOVAL OF PERIPHERAL (ARTERIAL AND/OR VENOUS) CANNULA(E), OPEN, 6 YEARS AND OLDER;  Surgeon: Alonna Buckler Ikonomidis, MD;  Location: MAIN OR [facility];  Service: Cardiac Surgery   ??? PR EPHYS EVAL W/ ABLATION SUPRAVENT ARRHYTHMIA N/A 07/19/2015    Catheter ablation of cavotricuspid isthmus for atrial flutter [facility])   ??? PR INSER HART PACER XVENOUS ATRIAL N/A 05/30/2015    [employer] dual-chamber pacemaker implant (E.Chung)   ??? PR INSERT/PLACE FLOW DIRECT CATH N/A 03/17/2017 Procedure: Insert Leave In [place];  Surgeon: Marlaine Hind, MD;  Location: [facility] CATH;  Service: Cardiology   ??? PR INSJ/RPLCMT PERM DFB W/TRNSVNS LDS 1/DUAL CHMBR N/A 05/04/2017    Procedure: ICD Implant System (Single/Dual);  Surgeon: Eldred Manges, MD;  Location: [facility] CATH;  Service: Cardiology   ??? PR NEGATIVE PRESSURE WOUND THERAPY DME >50 SQ CM Left 03/25/2017    Procedure: Neg Press Wound Tx (Vac Assist) Incl Topicals, Per Session, Tsa Greater Than/= 50 Cm Squared;  Surgeon: Alonna Buckler Ikonomidis, MD;  Location: MAIN OR [facility];  Service: Cardiac Surgery   ??? PR PRQ TRLUML CORONARY STENT W/ANGIO ONE ART/BRNCH N/A 03/12/2018    Procedure: Percutaneous Coronary Intervention;  Surgeon: Marlaine Hind, MD;  Location: [facility] CATH;  Service: Cardiology   ??? PR REBL VES DIRECT,LOW EXTREM Left 03/19/2017    Procedure: Repr Bld Vessel Direct; Lower Extrem;  Surgeon: Boykin Reaper, MD;  Location: MAIN OR [facility];  Service: Vascular   ??? PR REMV ART CLOT ILIAC-POP,LEG INCIS Left 03/25/2017    Procedure: EMBOLECTOMY OR THROMBECTOMY, WITH OR WITHOUT CATHETER; FEMOROPOPLITEAL, AORTOILIAC ARTERY, BY LEG INCISION;  Surgeon: Alonna Buckler Ikonomidis, MD;  Location: MAIN OR [facility];  Service: Cardiac Surgery   ??? PR UPPER GI ENDOSCOPY,BIOPSY N/A 02/28/2016    Procedure: UGI ENDOSCOPY; WITH BIOPSY, SINGLE OR MULTIPLE;  Surgeon: Liane Comber, MD;  [facility]: [facility];  Service: Gastroenterology   ??? PR UPPER GI ENDOSCOPY,DIAGNOSIS N/A 05/07/2016    Procedure: UGI ENDO, INCLUDE ESOPHAGUS, STOMACH, & DUODENUM &/OR JEJUNUM; DX W/WO COLLECTION SPECIMN, BY BRUSH OR WASH;  Surgeon: Modena Nunnery, MD;  Location: [facility];  Service: Gastroenterology       Social History     Socioeconomic History   ??? Marital status: Married     Spouse name: [name]   ??? Number of children: 3   ??? Years of education: 44   ??? Highest education level: None   Occupational History   ??? Occupation: DISABLED Comment: Former Surveyor, [employer] Needs   ??? Financial resource strain: Very hard   ??? Food insecurity     Worry: None     Inability: None   ??? Transportation needs     Medical: None     Non-medical: None   Tobacco Use   ??? Smoking status: Never Smoker   ??? Smokeless tobacco: Current User     Types: Chew   ??? Tobacco comment: Pt dips 1 can a week   Substance and Sexual Activity   ??? Alcohol use: Never     Alcohol/week: 0.0 standard drinks     Frequency: Never     Comment: rare use: couple times a year, small amount   ??? Drug use: No   ??? Sexual activity: Yes     Partners: Female   Lifestyle   ??? Physical activity     Days  per week: None     Minutes per session: None   ??? Stress: None   Relationships   ??? Social Wellsite geologist on phone: More than three times a week     Gets together: More than three times a week     Attends religious service: 1 to 4 times per year     Active member of club or organization: No     Attends meetings of clubs or organizations: Never     Relationship status: Married   Other Topics Concern   ??? Do you use sunscreen? No   ??? Tanning bed use? No   ??? Are you easily burned? No   ??? Excessive sun exposure? No   ??? Blistering sunburns? No   Social History Narrative    Information updated:  01/08/16. Reviewed 08/12/17:        Born in [place]    Raised with both parents    Parents died w/in 6 mos of each other. His parents knew they were going to pass and told him just before.    Half sister and 2 half brothers        Married 30 yrs. Wife [name]    2 other children son and daughter, adopted out    Daughter Bridgette, bf DJ        Education - completed through 11th. 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    Former Presenter, broadcasting since age 68        Lives with wife, difficulty paying bills 12/30/17     Lives next door to daughter, husband and their grandson              Family History   Problem Relation Age of Onset   ??? Diabetes Mother    ??? Hyperlipidemia Mother    ??? Heart disease Mother    ??? Basal cell carcinoma Mother    ??? Blindness Mother         diabeties   ??? Obesity Mother    ??? Hyperlipidemia Father    ??? Heart disease Father    ??? Lung disease Father    ??? Heart disease Half-Sister    ??? Kidney disease Half-Sister    ??? Gallbladder disease Half-Brother    ??? Obesity Half-Brother    ??? No Known Problems Daughter    ??? No Known Problems Son    ??? Obesity Daughter    ??? Melanoma Neg Hx    ??? Squamous cell carcinoma Neg Hx    ??? Macular degeneration Neg Hx    ??? Glaucoma Neg Hx        Code Status: Full Code     Review of Systems     A 10-system review of systems was conducted and was negative except as documented above in the HPI.       Objective        Temp:  [36.4 ??C-36.5 ??C] 36.5 ??C  Heart Rate:  [70-94] 82  Resp:  [15-24] 15  BP: (114-167)/(59-81) 143/72  MAP (mmHg):  [81-115] 115  SpO2:  [97 %-99 %] 99 %  No intake/output data recorded.    Physical Exam:  General Appearance:Chronically ill appearing. In no acute distress. Morbidly obese.  HEENT: Head is atraumatic and  normocephalic. Sclera anicteric without injection. Oropharyngeal membranes are moist with no erythema or exudate.  Neck: Deferred.  Lungs: Normal work of breathing.  Heart: warm and well perfused  Abdomen: soft abdomen.  Extremities: No clubbing, cyanosis, or edema.    Neurological Examination:     Mental Status: Alert, conversant, able to follow conversation and interview. Spontaneous speech was fluent without word finding pauses, dysarthria, or paraphasic errors. Comprehension was intact. Memory for recent and remote events was intact.  Motor, the patient was noted to stare off into space in front of him and become unresponsive for approximately 20 seconds during interview.  During this time, hand was dropped over his face and he avoided his face every single time.    Cranial Nerves: PERRL. Face symmetric at rest and with spontaneous activation. Hearing intact to conversation. Tongue protrudes midline and tongue movements are normal.     Motor Exam: Normal bulk.  No tremors, myoclonus, or other adventitious movement.  Patient was uncooperative with full formal motor testing.  However, he was at least 5/5 in bilateral biceps and triceps.  He was unwilling to do motor testing on his lower extremities due to chronic back pain.  However, he was 5/5 in dorsiflexion and plantar flexion on the right.  He states that he was unable to dorsiflex and plantarflex his left lower extremity which he says has been chronic since injury in 2018.    Reflexes: Trace reflexes throughout.  Toes downgoing bilaterally.    Sensory: Decreased sensation left lower extremity which the patient states is chronic.  Otherwise, intact to light touch throughout.    Cerebellar/Coordination/Gait: No ataxia on FtN BL            Diagnostic Studies      All Labs Last 24hrs:   Recent Results (from the past 24 hour(s))   POCT Glucose    Collection Time: 05/13/18  8:21 PM   Result Value Ref Range    Glucose, POC 162 70 - 179 mg/dL   Troponin I    Collection Time: 05/13/18 11:00 PM   Result Value Ref Range    Troponin I 0.409 (HH) <0.034 ng/mL   ECG 12 Lead    Collection Time: 05/14/18  2:20 AM   Result Value Ref Range    EKG Systolic BP  mmHg    EKG Diastolic BP  mmHg    EKG Ventricular Rate 79 BPM    EKG Atrial Rate 79 BPM    EKG P-R Interval 230 ms    EKG QRS Duration 94 ms    EKG Q-T Interval 398 ms    EKG QTC Calculation 456 ms    EKG Calculated P Axis 50 degrees    EKG Calculated R Axis 15 degrees    EKG Calculated T Axis 117 degrees    QTC Fredericia 436 ms   POCT Glucose    Collection Time: 05/14/18  6:04 AM   Result Value Ref Range    Glucose, POC 146 70 - 179 mg/dL   CBC    Collection Time: 05/14/18  8:01 AM   Result Value Ref Range    WBC 10.3 4.5 - 11.0 10*9/L    RBC 3.91 (L) 4.50 - 5.90 10*12/L    HGB 10.5 (L) 13.5 - 17.5 g/dL    HCT 16.1 (L) 09.6 - 53.0 %    MCV 86.6 80.0 - 100.0 fL    MCH 26.8 26.0 - 34.0 pg    MCHC 31.0 31.0 -  37.0 g/dL    RDW 16.1 (H) 09.6 - 15.0 %    MPV 8.2 7.0 - 10.0 fL    Platelet 244 150 - 440 10*9/L   Basic Metabolic Panel    Collection Time: 05/14/18  8:01 AM   Result Value Ref Range    Sodium 136 135 - 145 mmol/L    Potassium 5.2 (H) 3.5 - 5.0 mmol/L    Chloride 107 98 - 107 mmol/L    CO2 20.0 (L) 22.0 - 30.0 mmol/L    Anion Gap 9 7 - 15 mmol/L    BUN 43 (H) 7 - 21 mg/dL    Creatinine 0.45 (H) 0.70 - 1.30 mg/dL    BUN/Creatinine Ratio 9     EGFR CKD-EPI Non-African American, Male 12 (L) >=60 mL/min/1.86m2    EGFR CKD-EPI African American, Male 14 (L) >=60 mL/min/1.46m2    Glucose 134 70 - 179 mg/dL    Calcium 8.7 8.5 - 40.9 mg/dL   Magnesium Level    Collection Time: 05/14/18  8:01 AM   Result Value Ref Range    Magnesium 2.2 1.6 - 2.2 mg/dL   POCT Glucose    Collection Time: 05/14/18 10:58 AM   Result Value Ref Range    Glucose, POC 168 70 - 179 mg/dL   Basic Metabolic Panel    Collection Time: 05/14/18  4:31 PM   Result Value Ref Range    Sodium 136 135 - 145 mmol/L    Potassium 5.6 (H) 3.5 - 5.0 mmol/L    Chloride 108 (H) 98 - 107 mmol/L    CO2 17.0 (L) 22.0 - 30.0 mmol/L    Anion Gap 11 7 - 15 mmol/L    BUN 45 (H) 7 - 21 mg/dL    Creatinine 8.11 (H) 0.70 - 1.30 mg/dL    BUN/Creatinine Ratio 8     EGFR CKD-EPI Non-African American, Male 12 (L) >=60 mL/min/1.71m2    EGFR CKD-EPI African American, Male 14 (L) >=60 mL/min/1.59m2    Glucose 148 70 - 179 mg/dL    Calcium 8.8 8.5 - 91.4 mg/dL   POCT Glucose    Collection Time: 05/14/18  5:23 PM   Result Value Ref Range    Glucose, POC 153 70 - 179 mg/dL   POCT Glucose    Collection Time: 05/14/18  6:56 PM   Result Value Ref Range    Glucose, POC 146 70 - 179 mg/dL   Troponin I    Collection Time: 05/14/18  7:02 PM   Result Value Ref Range    Troponin I 0.277 (HH) <0.034 ng/mL   Basic Metabolic Panel    Collection Time: 05/14/18  7:02 PM   Result Value Ref Range    Sodium 137 135 - 145 mmol/L    Potassium 5.8 (H) 3.5 - 5.0 mmol/L    Chloride 107 98 - 107 mmol/L    CO2 17.0 (L) 22.0 - 30.0 mmol/L    Anion Gap 13 7 - 15 mmol/L    BUN 45 (H) 7 - 21 mg/dL    Creatinine 7.82 (H) 0.70 - 1.30 mg/dL    BUN/Creatinine Ratio 8     EGFR CKD-EPI Non-African American, Male 12 (L) >=60 mL/min/1.8m2    EGFR CKD-EPI African American, Male 13 (L) >=60 mL/min/1.68m2    Glucose 145 70 - 179 mg/dL    Calcium 8.9 8.5 - 95.6 mg/dL   Magnesium Level    Collection Time: 05/14/18  7:02 PM   Result  Value Ref Range    Magnesium 2.1 1.6 - 2.2 mg/dL   CBC    Collection Time: 05/14/18  7:02 PM   Result Value Ref Range    WBC 11.9 (H) 4.5 - 11.0 10*9/L    RBC 4.04 (L) 4.50 - 5.90 10*12/L    HGB 10.8 (L) 13.5 - 17.5 g/dL    HCT 81.1 (L) 91.4 - 53.0 %    MCV 85.3 80.0 - 100.0 fL    MCH 26.8 26.0 - 34.0 pg    MCHC 31.4 31.0 - 37.0 g/dL    RDW 78.2 (H) 95.6 - 15.0 %    MPV 8.0 7.0 - 10.0 fL    Platelet 250 150 - 440 10*9/L

## 2018-05-15 NOTE — Unmapped (Signed)
Advanced Heart Failure/Transplant/LVAD (MDD) Cardiology Progress Note  ??  Patient Name: Jeremy Oliver  MRN: [id number]  Date of Service: May 15, 2018   ??  Reason for Admission:   Jeremy Oliver??is a 48 y.o.??male??with hx of CAD, STEMI in 2018 complicated by cardiogenic shock following VT arrest with emergent PCI to mid LAD,??ischemic cardiomyopathy??with ICD [employer]),??CKD, paroxysmal afib, HTN,??HLD,??T2DM, and morbid obesity. He presents to [facility] ED on 02/11 as a transfer from [facility] for workup of NSTEMI.     Assessment and Plan:     NSTEMI:??Troponin elevated and peaked at 1.09 on 02/11. Patient with significant CAD history and has known stenosis in multiple vessels. The rise in Troponin with no ST segment elevation on EKG is c/w NSTEMI. Patient is now s/p LHC with stent to LAD (02/12). Troponin decreased to 0.409 yesterday evening.   - holding losartan until seen outpatient next week (in the setting of Cr bump post contrast)  - Coreg 25 mg daily and 50 mg nightly  - Xarelto 15 mg daily (home dose)  - Brilinta 90 mg BID (home dose)  - Atorvastatin 80 mg daily   - telemetry      Heart Failure with Reduced Ejection Fraction  Last TTE??- positive for newly decreased LVEF of 45-50% with mild LV hypertrophy and anteroseptal segmental wall motion abnormality.   - holding home Bumex 2 mg daily starting 02/13 in the setting of increasing Cr  ??  Hypertension:??Pressures at [facility] were in the 200s systolic. Pressures here on admission have been in the 160s-170s systolic.??Blood pressure is now better controlled with PO meds.    - Continue Norvasc 10mg .??  - Coreg 25 mg??in morning and 50 mg at night  - amlodipine 10 mg daily  -??increased??Hydralazine to 50 mg TID on 02/15,  -  Imdur 30 mg    - losartan 50mg  daily (Losartan held starting 02/13 with Cr bump and to be resumed per outpatient assessment with cards next week)  ??  Paroxsymal A. Fib: amiodarone keeping afib under control. TSH elevated to 5.06 yesterday but T4 is normal at 1.10. Recommend continuing amio at this time and then following Thyroid function at outpatient follow-up.  - continue home Amiodarone 200 mg daily  ??  CKD:??Cr at baseline on admission, but has been increasing s/p LHC contrast.    - continue to monitor Cr  - nephrology consulted  - low K diet    T2DMl: Hx of T2DM, glucose elvated to 485 on admission to [place]. Glucose trending downward to 272 upon recheck.??Glucose this AM was 168.??Will??continue??him on Lantus 25 BID and sliding scale insulin as sugars have been much better controlled.  - Lantus 25 BID  - sliding scale insulin  ??  Hyperlipidemia: Lipids on admission remarkable for triglycerides 733, total cholesterol 229, HDL 27.his home medications include Praluent inject q14 days, and zetia. He just received his praluent shot a few days ago so will continue his atorvastatin 80 mg daily and Zetia 10 mg. Triglycerides on recheck were 654. We feel that at this level, he is not at risk for acute pancreatitis but should definitely be followed as outpatient.   -cont Zetia 10 mg and atorvastatin 80 mg  -Restart Praluent q14 days on discharge  ??  Left Shoulder pain:??Quite tender to palpation on exam, Mariotti be some MSK component to ongoing pain but unable to do a full exam due to pain. Received lidocaine patch. Pain worsening after syncopal event so more likely to be cardiac  etiology at this point. Will continue to monitor??  ??- lidocaine patch PRN  ??  #) Prophylaxis: on anticoagulation PO meds   #) Code Status: FULL CODE verified on admission     ---------------------------------------------------------------------------------------------------------------   Interval History/Subjective:   Patient had a rapid response called last night as he was reportedly had a fixed gaze for about 30 seconds, during which he did not respond or follow commands. The episode resolved spontaneously and neuro-exam is unchanged. Neurology was consulted and recommended EEG video monitoring to assess for any seizures.      Objective:     Medications:  ??? amiodarone  200 mg Oral Daily   ??? amLODIPine  10 mg Oral Daily   ??? atorvastatin  80 mg Oral Daily   ??? carvediloL  25 mg Oral QAM AC   ??? carvediloL  50 mg Oral Nightly   ??? ezetimibe  10 mg Oral Nightly   ??? FLUoxetine  60 mg Oral Nightly   ??? hydrALAZINE  50 mg Oral TID   ??? insulin glargine  25 Units Subcutaneous BID   ??? insulin regular  0-12 Units Subcutaneous ACHS   ??? isosorbide mononitrate  30 mg Oral Daily   ??? lidocaine  1 patch Transdermal Daily   ??? melatonin  9 mg Oral Nightly   ??? polyethylene glycol  17 g Oral Daily   ??? rivaroxaban  15 mg Oral Daily   ??? senna  2 tablet Oral Nightly   ??? sevelamer  1,600 mg Oral 3xd Meals   ??? sodium bicarbonate  1,300 mg Oral TID   ??? ticagrelor  90 mg Oral BID       acetaminophen, albuterol, cyclobenzaprine, cyclobenzaprine, dextrose, lactulose, nitroglycerin, ondansetron    Physical Examination:  Temp:  [36.5 ??C-36.7 ??C] 36.6 ??C  Heart Rate:  [66-95] 95  Resp:  [15-24] 16  BP: (127-167)/(61-81) 127/61  MAP (mmHg):  [88-115] 88  SpO2:  [95 %-99 %] 95 %    Height: 188 cm (6' 2)  Body mass index is 45.88 kg/m??.  Wt Readings from Last 3 Encounters:   05/14/18 (!) 162.1 kg (357 lb 5.9 oz)   05/11/18 (!) 158.9 kg (350 lb 6.4 oz)   04/23/18 (!) 161.7 kg (356 lb 6.4 oz)     General:  Sitting up in bed. Morbidly obese male. No acute distress  HEENT:  NCAT. MMM  Lungs: CTA B/L , no increased work of breathing  CV: RRR  Abd: Soft, NT. Mildly distended  BJY:NWGN and well perfused  Neuro: awake, alert and oriented x3    Intake/Output Summary (Last 24 hours) at 05/15/2018 1220  Last data filed at 05/15/2018 1100  Gross per 24 hour   Intake 1033 ml   Output 2050 ml   Net -1017 ml     I/O last 3 completed shifts:  In: 715 [P.O.:715]  Out: 1553 [Urine:1552; Stool:1]  I/O       02/13 0701 - 02/14 0700 02/14 0701 - 02/15 0700 02/15 0701 - 02/16 0700    P.O. 240 715 318    I.V. (mL/kg)       Total Intake 240 715 318    Urine (mL/kg/hr) 2 (0) 1550 (0.4) 500 (0.6)    Stool 1      Total Output(mL/kg) 3 (0) 1550 (9.6) 500 (3.1)    Net +237 -835 -182           Urine Occurrence 2 x 2 x     Stool  Occurrence 1 x               Labs & Imaging:  Reviewed in EPIC.   Lab Results   Component Value Date    WBC 12.9 (H) 05/15/2018    HGB 10.0 (L) 05/15/2018    HCT 31.2 (L) 05/15/2018    PLT 231 05/15/2018     Lab Results   Component Value Date    NA 134 (L) 05/15/2018    K 5.1 (H) 05/15/2018    CL 107 05/15/2018    CO2 16.0 (L) 05/15/2018    BUN 43 (H) 05/15/2018    CREATININE 5.49 (H) 05/15/2018    GLU 115 05/15/2018    CALCIUM 8.5 05/15/2018    MG 1.9 05/15/2018    PHOS 5.2 (H) 05/13/2018     Lab Results   Component Value Date    BILITOT 0.2 05/11/2018    BILIDIR 0.50 (H) 04/07/2017    PROT 6.7 05/11/2018    ALBUMIN 3.4 (L) 05/11/2018    ALT 11 05/11/2018    AST 17 (L) 05/11/2018    ALKPHOS 94 05/11/2018    GGT 32 01/09/2012     Lab Results   Component Value Date    LABPROT 10.5 10/16/2013    INR 1.03 05/11/2018    APTT 69.9 (H) 05/12/2018        Lab Results   Component Value Date    INR, POC 1.2 02/12/2018    INR, POC 0.9 02/01/2018    INR 1.03 05/11/2018    INR 0.95 05/11/2018    LDH 1,231 (H) 03/21/2017    LDH 4,526 (H) 03/18/2017    PRO-BNP 1,670.0 (H) 05/11/2018    PRO-BNP 551.0 (H) 11/22/2017    PRO-BNP 231 (H) 06/08/2014

## 2018-05-15 NOTE — Unmapped (Signed)
Follow up Consult Note        Requesting Attending Physician:  Vanetta Mulders, MD  Service Requesting Consult: Cardiology [facility])     Assessment and Plan          Jeremy Oliver is a 48 y.o. male with hx of CAD, STEMI in 2018 complicated by cardiogenic shock following VT arrest with emergent PCI to mid LAD,??ischemic cardiomyopathy??with ICD [facility] [employer]),??CKD, paroxysmal afib, HTN,??HLD,??T2DM, and morbid obesity.  on whom I have been asked by Vanetta Mulders, MD to consult for altered mental status.      Altered mental status: The patient has had multiple episodes of altered mental status starting 2/13 evening.  From description, it is possible that the patient had seizure activity causing transient alteration in consciousness.  However, when he was observed during 1 of these events, and he would repeatedly avoid his face with hand drop suggesting a functional exam.  Also, given his cardiac history with recent cath and stent placement as well as a history of atrial fibrillation, it is possible that the patient had arrhythmias causing alteration in consciousness.  He does not appear to have any new weakness on examination.  He states that his left lower extremity weakness and numbness is unchanged from his baseline and has been present for years.     Interval history: this morning his at his baseline, without altered mental status.  EEG was placed, however he did not have further events of concern.  There has been no epileptic activity seen on EEG thus far.  Since no events are captured, we recommend continuing the EEG release for another day.  We will review the EEG tomorrow and consider discontinuing if remaining normal    Recommendations:  -Continue video EEG      Recommendations discussed with patient and primary team. This patient was discussed with Dr. Seward Meth who agrees to the above assessment and plan. We will sign off at this time. Please re-consult if further neurologic questions arise. Stefan Church MD MPH MBA  Child Neurology PGY3         Subjective/interval events         Patient states that he is feeling better.  He reports having bilateral lower extremity pain after getting up from his chair.  He denies any changes in his mental status that he can remember.  He is requesting that the leads to be taken off because he would like to take a bath/shower.     Review of Systems     A 10-system review of systems was conducted and was negative except as documented above in the HPI.       Objective        Temp:  [36.5 ??C-36.7 ??C] 36.6 ??C  Heart Rate:  [66-95] 95  Resp:  [15-24] 16  BP: (127-167)/(61-81) 127/61  MAP (mmHg):  [88-115] 88  SpO2:  [95 %-99 %] 95 %  I/O this shift:  In: 318 [P.O.:318]  Out: 300 [Urine:300]    Physical Exam:  General Appearance:Chronically ill appearing. In no acute distress. Morbidly obese.  HEENT: Leads are in place with mesh covering. Sclera anicteric without injection. Oropharyngeal membranes are moist with no erythema or exudate.  Neck: Normal range of motion  Lungs: Normal work of breathing.  Heart: Regular rate  Abdomen: Deferred  Extremities: No clubbing    Neurological Examination:     Mental Status: Alert, conversant, able to follow conversation and interview. Spontaneous speech was fluent without  word finding pauses, dysarthria, or paraphasic errors. Comprehension was intact. Memory for recent and remote events was intact.      Cranial Nerves: PERRL. Face symmetric at rest and with spontaneous activation. Hearing intact to conversation.     Motor Exam: Normal bulk.  No tremors, myoclonus, or other adventitious movement.  Moving all extremities equally, able to turn himself in bed.    Reflexes: Deferred    Sensory: Deferred    Cerebellar/Coordination/Gait: Grossly smooth movements of all extremities.            Diagnostic Studies      All Labs Last 24hrs:   Recent Results (from the past 24 hour(s))   POCT Glucose    Collection Time: 05/14/18 10:58 AM   Result Value Ref Range    Glucose, POC 168 70 - 179 mg/dL   Basic Metabolic Panel    Collection Time: 05/14/18  4:31 PM   Result Value Ref Range    Sodium 136 135 - 145 mmol/L    Potassium 5.6 (H) 3.5 - 5.0 mmol/L    Chloride 108 (H) 98 - 107 mmol/L    CO2 17.0 (L) 22.0 - 30.0 mmol/L    Anion Gap 11 7 - 15 mmol/L    BUN 45 (H) 7 - 21 mg/dL    Creatinine 1.61 (H) 0.70 - 1.30 mg/dL    BUN/Creatinine Ratio 8     EGFR CKD-EPI Non-African American, Male 12 (L) >=60 mL/min/1.36m2    EGFR CKD-EPI African American, Male 14 (L) >=60 mL/min/1.58m2    Glucose 148 70 - 179 mg/dL    Calcium 8.8 8.5 - 09.6 mg/dL   POCT Glucose    Collection Time: 05/14/18  5:23 PM   Result Value Ref Range    Glucose, POC 153 70 - 179 mg/dL   ECG 12 Lead    Collection Time: 05/14/18  6:45 PM   Result Value Ref Range    EKG Systolic BP  mmHg    EKG Diastolic BP  mmHg    EKG Ventricular Rate 92 BPM    EKG Atrial Rate 92 BPM    EKG P-R Interval 220 ms    EKG QRS Duration 94 ms    EKG Q-T Interval 384 ms    EKG QTC Calculation 474 ms    EKG Calculated P Axis 63 degrees    EKG Calculated R Axis 19 degrees    EKG Calculated T Axis 107 degrees    QTC Fredericia 442 ms   POCT Glucose    Collection Time: 05/14/18  6:56 PM   Result Value Ref Range    Glucose, POC 146 70 - 179 mg/dL   Troponin I    Collection Time: 05/14/18  7:02 PM   Result Value Ref Range    Troponin I 0.277 (HH) <0.034 ng/mL   Basic Metabolic Panel    Collection Time: 05/14/18  7:02 PM   Result Value Ref Range    Sodium 137 135 - 145 mmol/L    Potassium 5.8 (H) 3.5 - 5.0 mmol/L    Chloride 107 98 - 107 mmol/L    CO2 17.0 (L) 22.0 - 30.0 mmol/L    Anion Gap 13 7 - 15 mmol/L    BUN 45 (H) 7 - 21 mg/dL    Creatinine 0.45 (H) 0.70 - 1.30 mg/dL    BUN/Creatinine Ratio 8     EGFR CKD-EPI Non-African American, Male 12 (L) >=60 mL/min/1.13m2    EGFR CKD-EPI African American,  Male 13 (L) >=60 mL/min/1.45m2    Glucose 145 70 - 179 mg/dL    Calcium 8.9 8.5 - 16.1 mg/dL   Magnesium Level    Collection Time: 05/14/18  7:02 PM   Result Value Ref Range    Magnesium 2.1 1.6 - 2.2 mg/dL   CBC    Collection Time: 05/14/18  7:02 PM   Result Value Ref Range    WBC 11.9 (H) 4.5 - 11.0 10*9/L    RBC 4.04 (L) 4.50 - 5.90 10*12/L    HGB 10.8 (L) 13.5 - 17.5 g/dL    HCT 09.6 (L) 04.5 - 53.0 %    MCV 85.3 80.0 - 100.0 fL    MCH 26.8 26.0 - 34.0 pg    MCHC 31.4 31.0 - 37.0 g/dL    RDW 40.9 (H) 81.1 - 15.0 %    MPV 8.0 7.0 - 10.0 fL    Platelet 250 150 - 440 10*9/L   POCT Glucose    Collection Time: 05/14/18  9:14 PM   Result Value Ref Range    Glucose, POC 122 70 - 179 mg/dL   POCT Glucose    Collection Time: 05/15/18  5:54 AM   Result Value Ref Range    Glucose, POC 113 70 - 179 mg/dL   CBC    Collection Time: 05/15/18  8:04 AM   Result Value Ref Range    WBC 12.9 (H) 4.5 - 11.0 10*9/L    RBC 3.64 (L) 4.50 - 5.90 10*12/L    HGB 10.0 (L) 13.5 - 17.5 g/dL    HCT 91.4 (L) 78.2 - 53.0 %    MCV 85.8 80.0 - 100.0 fL    MCH 27.5 26.0 - 34.0 pg    MCHC 32.1 31.0 - 37.0 g/dL    RDW 95.6 (H) 21.3 - 15.0 %    MPV 8.5 7.0 - 10.0 fL    Platelet 231 150 - 440 10*9/L   Basic Metabolic Panel    Collection Time: 05/15/18  8:04 AM   Result Value Ref Range    Sodium 134 (L) 135 - 145 mmol/L    Potassium 5.1 (H) 3.5 - 5.0 mmol/L    Chloride 107 98 - 107 mmol/L    CO2 16.0 (L) 22.0 - 30.0 mmol/L    Anion Gap 11 7 - 15 mmol/L    BUN 43 (H) 7 - 21 mg/dL    Creatinine 0.86 (H) 0.70 - 1.30 mg/dL    BUN/Creatinine Ratio 8     EGFR CKD-EPI Non-African American, Male 11 (L) >=60 mL/min/1.74m2    EGFR CKD-EPI African American, Male 13 (L) >=60 mL/min/1.55m2    Glucose 115 70 - 179 mg/dL    Calcium 8.5 8.5 - 57.8 mg/dL   Magnesium Level    Collection Time: 05/15/18  8:04 AM   Result Value Ref Range    Magnesium 1.9 1.6 - 2.2 mg/dL

## 2018-05-15 NOTE — Unmapped (Signed)
Nephrology Consult Note      Assessment/Recommendations: [name] is a 48 y.o. male w/ PMH of CKD4, DM2, HTN, STEMI with cardiogenic shock and VT arrest 2018 s/p PCI, pafib, HLD, morbid obesity who presented to [facility] with NSTEMI. We have been consulted for evaluation of aki on ckd.     AKI/CKD4  - [id number] likely secondary to longstanding history of DM2 and HTN (poorly controlled)  -baseline cr fluctuates between 3-3.7  -aki likely related to CIN given timeline of events as his cr started to truly increase around 24 hours post procedure, his Mehran Score places his roughly around 57.3% chance of him developing post-PCI CIN (received around 270cc of contrast), there is some evidence of ATN on sediment  -another etiology that Jeremy Oliver be a factor is relative hypotension. It seems at baseline he usually runs much higher. Would check orthostats and allow for some permissive HTN with a SBP ~140  -advise renal diet/K restricted diet  -given his long standing history of DM and hard to control K (along with acidosis which also can be explained by CKD), I am suspicious of a RTA4-type physiology therefore would recommend checking aldosterone levels  -no indication for renal replacement therapy currently, if labs improve can follow up with his outpatient nephrologist for further dialysis planning in the future  -agree with holding ARB and bumex  -urine sediment: occasional granular debris, no granular casts, very rare RTE's and hyaline casts  - Strict I/O's  - Avoid nephrotoxic drugs including but not limited to NSAIDs, contrasted studies, and fleets enemas   - Dose all meds for creatinine clearance   - Unless absolutely necessary, no MRIs with gadolinium given potential risk for NSF.    Bone Mineral Metabolism/Secondary Hyperparathyroidism   -calcium and phos acceptable  -continue with   -resume renvela if phos increases  -advise phos restricted diet    Volume/HTN  -overall euvolemic on exam today, see above for BP control, aim for SBP ~140 (concern for relative hypotension)  -given volume status agree with holding bumex    Non-Anion Metabolic Acidosis  -continue with nahco3 1300mg  tid    Thank you for this consult.  Nephrology will continue to follow along with the primary team.  Please page the renal fellow on call with questions/concerns.    Patient was seen and evaluated by Dr. Eulah Oliver who agrees with this assessment and plan.     Requesting Attending Physician :  Jeremy Mulders, MD  Service Requesting Consult : Cardiology [facility])    Reason for Consult: aki/ckd    HISTORY OF PRESENT ILLNESS: [name] is a 48 y.o. male w/ PMH of CKD4, DM2, HTN, STEMI with cardiogenic shock and VT arrest 2018 s/p PCI, pafib, HLD, morbid obesity who presented to [facility] with NSTEMI on 2/11. He is s/p LHC with stent to LAD on 2/12 which he tolerated well. Since then his renal function has been deteriorating. He does report nausea with vomiting at least once a day and this has been an ongoing issue. He denies any OTC med use. He did have a rapid response called on 2/14 for fixed gaze and was not following any commands therefore he is currently being evaluated with vEEG. He does report getting dizzy upon standing sometimes.  He denies fever, chills, chest pain, SOB, abd pain, swelling, diarrhea, decreased appetite.    Medications:   ??? amiodarone  200 mg Oral Daily   ??? amLODIPine  10 mg Oral Daily   ???  atorvastatin  80 mg Oral Daily   ??? carvediloL  25 mg Oral QAM AC   ??? carvediloL  50 mg Oral Nightly   ??? ezetimibe  10 mg Oral Nightly   ??? FLUoxetine  60 mg Oral Nightly   ??? hydrALAZINE  50 mg Oral TID   ??? insulin glargine  25 Units Subcutaneous BID   ??? insulin regular  0-12 Units Subcutaneous ACHS   ??? isosorbide mononitrate  30 mg Oral Daily   ??? lidocaine  1 patch Transdermal Daily   ??? melatonin  9 mg Oral Nightly   ??? polyethylene glycol  17 g Oral Daily   ??? rivaroxaban  15 mg Oral Daily   ??? senna  2 tablet Oral Nightly   ??? sevelamer  1,600 mg Oral 3xd Meals   ??? sodium bicarbonate  1,300 mg Oral TID   ??? ticagrelor  90 mg Oral BID        ALLERGIES  Morphine    MEDICAL HISTORY  Past Medical History:   Diagnosis Date   ??? Angina at rest (CMS-[name])    ??? Arthritis    ??? Asthma    ??? Atrial fibrillation and flutter (CMS-[name]) 06/08/14   ??? BMI 40.0-44.9, adult (CMS-[name]) 10/29/2015   ??? Chronic anticoagulation 02/17/2018   ??? Chronic kidney disease, stage 3 (CMS-[name])    ??? Chronic pain syndrome 04/11/2014    ~Use daily lyrica as recommended ~Tramadol as needed for pain ~Fluoxetine daily as recommended ~Daily exercise ~Eat a healthy diet ~Good control of your blood sugars can help    ??? Coronary artery disease    ??? Depression    ??? Diabetes mellitus (CMS-[name])    ??? Eye trauma 1998    glass in both eyes and removed   ??? GERD (gastroesophageal reflux disease)    ??? Gout    ??? Homeless    ??? Hyperlipidemia    ??? Hypertension    ??? Illiteracy and low-level literacy    ??? Microscopic hematuria    ??? Migraine    ??? Nephrolithiasis    ??? 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 Nonalcoholic fatty liver disease    ??? NSTEMI (non-ST elevated myocardial infarction) (CMS-[name]) 01/13/2017   ??? Obesity    ??? Obstructive sleep apnea 2009   ??? Panic attacks    ??? 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 Seizure-like activity (CMS-[name])     ~Monitor for symptoms and report them to your primary care provider if they occur    ??? Systolic CHF, chronic (CMS-[name])     EF 40-45% 2017   ??? TIA (transient ischemic attack)         SOCIAL HISTORY  Social History     Socioeconomic History   ??? Marital status: Married     Spouse name: [name]   ??? Number of children: 3   ??? Years of education: 7   ??? Highest education level: None   Occupational History   ??? Occupation: DISABLED     Comment: Former Surveyor, quantity Needs   ??? Financial  resource strain: Very hard   ??? Food insecurity     Worry: None     Inability: None   ??? Transportation needs     Medical: None     Non-medical: None   Tobacco Use   ??? Smoking status: Never Smoker   ??? Smokeless tobacco: Current User     Types: Chew   ??? Tobacco comment: Pt dips 1 can a week   Substance and Sexual Activity   ??? Alcohol use: Never     Alcohol/week: 0.0 standard drinks     Frequency: Never     Comment: rare use: couple times a year, small amount   ??? Drug use: No   ??? Sexual activity: Yes     Partners: Female   Lifestyle   ??? Physical activity     Days per week: None     Minutes per session: None   ??? Stress: None   Relationships   ??? Social Wellsite geologist on phone: More than three times a week     Gets together: More than three times a week     Attends religious service: 1 to 4 times per year     Active member of club or organization: No     Attends meetings of clubs or organizations: Never     Relationship status: Married   Other Topics Concern   ??? Do you use sunscreen? No   ??? Tanning bed use? No   ??? Are you easily burned? No   ??? Excessive sun exposure? No   ??? Blistering sunburns? No   Social History Narrative    Information updated:  01/08/16. Reviewed 08/12/17:        Born in [place]    Raised with both parents    Parents died w/in 6 mos of each other. His parents knew they were going to pass and told him just before.    Half sister and 2 half brothers        Married 30 yrs. Wife [name]    2 other children son and daughter, adopted out    Daughter Bridgette, bf DJ        Education - completed through 11th. 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    Former Presenter, broadcasting since age 60        Lives with wife, difficulty paying bills 12/30/17     Lives next door to daughter, husband and their grandson             FAMILY HISTORY  Family History   Problem Relation Age of Onset   ??? Diabetes Mother    ??? Hyperlipidemia Mother    ??? Heart disease Mother    ??? Basal cell carcinoma Mother    ??? Blindness Mother         diabeties   ??? Obesity Mother    ??? Hyperlipidemia Father    ??? Heart disease Father    ??? Lung disease Father    ??? Heart disease Half-Sister    ??? Kidney disease Half-Sister    ??? Gallbladder disease Half-Brother    ??? Obesity Half-Brother    ??? No Known Problems Daughter    ??? No Known Problems Son    ??? Obesity Daughter    ???  Melanoma Neg Hx    ??? Squamous cell carcinoma Neg Hx    ??? Macular degeneration Neg Hx    ??? Glaucoma Neg Hx         Code Status:  Full Code    Review of Systems:  A 12 system review of systems was negative except as noted in HPI.      Physical Exam:  Vitals:    05/15/18 0800   BP: 127/61   Pulse: 95   Resp: 16   Temp: 36.6 ??C   SpO2: 95%     I/O this shift:  In: 318 [P.O.:318]  Out: 500 [Urine:500]    Intake/Output Summary (Last 24 hours) at 05/15/2018 1254  Last data filed at 05/15/2018 1100  Gross per 24 hour   Intake 1033 ml   Output 2050 ml   Net -1017 ml       General: well-appearing, no acute distress, currently on video eeg  HEENT: anicteric sclera, OP clear without lesions  CV: RRR, no m/r/g, no edema  Lungs: CTAB, normal wob  Abd: soft, non-tender, non-distended, +bs, obese  MSK: no visible joint swelling  Skin: no visible lesions or rashes  Psych: alert, engaged, appropriate mood and affect  Neuro: speech clear and coherent, no asterixis, moves all ext spontaneously    LABS    Creatinine Trend  Lab Results   Component Value Date    Creatinine 5.49 (H) 05/15/2018    Creatinine 5.37 (H) 05/14/2018    Creatinine 5.31 (H) 05/14/2018    Creatinine 5.05 (H) 05/14/2018    Creatinine 4.18 (H) 05/13/2018    Creatinine 3.49 (H) 05/13/2018    Creatinine 3.07 (H) 05/12/2018    Creatinine 2.92 (H) 05/11/2018    Creatinine 3.05 (H) 05/11/2018    Creatinine 2.96 (H) 04/23/2018    Creatinine 3.69 (H) 03/18/2018    Creatinine 3.62 (H) 03/14/2018    Creatinine 3.64 (H) 03/13/2018    Creatinine 3.33 (H) 03/12/2018    Creatinine 3.43 (H) 03/11/2018    Creatinine 2.63 (H) 02/15/2018    Creatinine 3.45 (H) 12/14/2017    Creatinine 3.40 (H) 11/25/2017    Creatinine 3.45 (H) 11/24/2017    Creatinine 3.31 (H) 11/22/2017    Creatinine 1.30 06/09/2014    Creatinine 1.56 (H) 06/08/2014    Creatinine 1.32 (H) 10/17/2013    Creatinine 1.41 (H) 10/16/2013    Creatinine 1.36 (H) 07/25/2013    Creatinine 1.20 08/30/2012    Creatinine 1.23 07/21/2012    Creatinine 1.14 01/09/2012    Creatinine 1.15 09/17/2011    Creatinine 1.14 09/06/2011    Creatinine 1.09 06/09/2011    Creatinine 1.15 05/12/2011    Creatinine 1.22 01/09/2011    Creatinine 1.06 09/04/2010        Lab Results   Component Value Date    Color, UA Light Yellow 04/23/2018    Color, UA Light-Yellow 06/08/2014    Specific Gravity, UA 1.014 04/23/2018    Specific Gravity, UA 1.019 06/08/2014    pH, UA 6.5 04/23/2018    pH, UA 5.5 06/08/2014    Protein, UA 300 mg/dL (A) 16/12/9602    Protein, UA +2 06/08/2014    Glucose, UA >1000 mg/dL (A) 54/11/8117    Glucose, UA +4 06/08/2014    Ketones, UA Negative 04/23/2018    Ketones, UA TRACE 06/08/2014    Blood, UA Trace (A) 04/23/2018    Blood, UA NEGATIVE 06/08/2014    Nitrite, UA Negative 04/23/2018    Nitrite, UA  NEGATIVE 06/08/2014    Leukocyte Esterase, UA Negative 04/23/2018    Leukocyte Esterase, UA NEGATIVE 06/08/2014    Urobilinogen, UA 0.2 mg/dL 16/12/9602    Urobilinogen, UA 1 06/08/2014    Bilirubin, UA Negative 04/23/2018    Bilirubin, UA NEGATIVE 06/08/2014    WBC, UA <1 06/08/2014    RBC, UA <1 06/08/2014       Lab Results   Component Value Date    Sodium, Ur 49 05/27/2015    Urea Nitrogen, Ur 593 05/27/2015    Creatinine, Urine 171.3 01/09/2011    Creat U 55.2 04/23/2018 Protein/Creatinine Ratio, Urine 1.669 03/17/2017        Lab Results   Component Value Date    Sodium, Ur 49 05/27/2015        Lab Results   Component Value Date    Sodium 134 (L) 05/15/2018    Sodium Whole Blood 132 (L) 03/25/2017    Potassium 5.1 (H) 05/15/2018    Potassium, Bld 5.5 (H) 03/25/2017    Chloride 107 05/15/2018    Chloride 105 06/09/2014    CO2 16.0 (L) 05/15/2018    CO2 23 06/09/2014    BUN 43 (H) 05/15/2018    BUN 24 (H) 06/09/2014    Creatinine 5.49 (H) 05/15/2018    Creatinine 1.30 06/09/2014     Lab Results   Component Value Date    Calcium 8.5 05/15/2018    Calcium 8.3 (L) 06/09/2014    Magnesium 1.9 05/15/2018    Magnesium 2.0 06/09/2014    Phosphorus 5.2 (H) 05/13/2018    Phosphorus 4.0 05/12/2011    Albumin 3.4 (L) 05/11/2018    Albumin 4.3 06/08/2014        Lab Results   Component Value Date    WBC 12.9 (H) 05/15/2018    WBC 7.5 06/09/2014    HGB 10.0 (L) 05/15/2018    Hemoglobin 8.1 (L) 03/25/2017    HCT 31.2 (L) 05/15/2018    HCT 37.9 (L) 06/09/2014    Platelet 231 05/15/2018    Platelet 190 06/09/2014        IMAGING STUDIES:  reviewed

## 2018-05-16 DIAGNOSIS — I214 Non-ST elevation (NSTEMI) myocardial infarction: Principal | ICD-10-CM

## 2018-05-16 LAB — CBC W/ AUTO DIFF
BASOPHILS ABSOLUTE COUNT: 0 10*9/L (ref 0.0–0.1)
BASOPHILS RELATIVE PERCENT: 0.4 %
EOSINOPHILS ABSOLUTE COUNT: 0.1 10*9/L (ref 0.0–0.4)
HEMATOCRIT: 29.6 % — ABNORMAL LOW (ref 41.0–53.0)
HEMOGLOBIN: 9.4 g/dL — ABNORMAL LOW (ref 13.5–17.5)
LARGE UNSTAINED CELLS: 3 % (ref 0–4)
LYMPHOCYTES ABSOLUTE COUNT: 1 10*9/L — ABNORMAL LOW (ref 1.5–5.0)
LYMPHOCYTES RELATIVE PERCENT: 11.4 %
MEAN CORPUSCULAR HEMOGLOBIN CONC: 31.8 g/dL (ref 31.0–37.0)
MEAN CORPUSCULAR HEMOGLOBIN: 27.3 pg (ref 26.0–34.0)
MEAN PLATELET VOLUME: 8.4 fL (ref 7.0–10.0)
MONOCYTES ABSOLUTE COUNT: 0.8 10*9/L (ref 0.2–0.8)
MONOCYTES RELATIVE PERCENT: 9.1 %
NEUTROPHILS ABSOLUTE COUNT: 6.9 10*9/L (ref 2.0–7.5)
NEUTROPHILS RELATIVE PERCENT: 75.7 %
PLATELET COUNT: 230 10*9/L (ref 150–440)
RED BLOOD CELL COUNT: 3.45 10*12/L — ABNORMAL LOW (ref 4.50–5.90)
RED CELL DISTRIBUTION WIDTH: 15.4 % — ABNORMAL HIGH (ref 12.0–15.0)
WBC ADJUSTED: 9.1 10*9/L (ref 4.5–11.0)

## 2018-05-16 LAB — BASIC METABOLIC PANEL
ANION GAP: 12 mmol/L (ref 7–15)
BLOOD UREA NITROGEN: 44 mg/dL — ABNORMAL HIGH (ref 7–21)
BUN / CREAT RATIO: 8
CALCIUM: 8.5 mg/dL (ref 8.5–10.2)
CHLORIDE: 106 mmol/L (ref 98–107)
CO2: 19 mmol/L — ABNORMAL LOW (ref 22.0–30.0)
CREATININE: 5.77 mg/dL — ABNORMAL HIGH (ref 0.70–1.30)
EGFR CKD-EPI AA MALE: 12 mL/min/{1.73_m2} — ABNORMAL LOW (ref >=60)
EGFR CKD-EPI NON-AA MALE: 11 mL/min/{1.73_m2} — ABNORMAL LOW (ref >=60)
GLUCOSE RANDOM: 104 mg/dL (ref 70–179)
SODIUM: 137 mmol/L (ref 135–145)

## 2018-05-16 LAB — URINALYSIS
BACTERIA: NONE SEEN /HPF
BILIRUBIN UA: NEGATIVE
KETONES UA: NEGATIVE
LEUKOCYTE ESTERASE UA: NEGATIVE
NITRITE UA: NEGATIVE
PH UA: 6.5 (ref 5.0–9.0)
PROTEIN UA: 100 — AB
RBC UA: <1 /HPF (ref <=3)
SPECIFIC GRAVITY UA: 1.013 (ref 1.003–1.030)
SQUAMOUS EPITHELIAL: <1 /HPF (ref 0–5)
UROBILINOGEN UA: 0.2
WBC UA: <1 /HPF (ref <=2)

## 2018-05-16 LAB — UROBILINOGEN UA: Lab: 0.2

## 2018-05-16 LAB — MAGNESIUM: Magnesium:MCnc:Pt:Ser/Plas:Qn:: 1.9

## 2018-05-16 LAB — BLOOD UREA NITROGEN: Urea nitrogen:MCnc:Pt:Ser/Plas:Qn:: 44 — ABNORMAL HIGH

## 2018-05-16 LAB — MEAN CORPUSCULAR VOLUME: Lab: 85.7

## 2018-05-16 NOTE — Unmapped (Signed)
Mr. Sabourin started vomiting at the beginning of the shift. I gave him IV Zofran and he then went and ate an entire plate of food. He stated he felt it was due to coughing. Later on it happened again. I gave him Zofran ODT and got him an order for cough syrup. He took both of those and again had an episode of coughing and vomiting. He denies any abdominal or chest pain. He remains connected to continuous EEG monitoring. His wife expressed she was concerned he was anxious to leave the hospital due to their current lack of housing.     Problem: Adult Inpatient Plan of Care  Goal: Plan of Care Review  Outcome: Progressing  Goal: Patient-Specific Goal (Individualization)  Outcome: Progressing  Goal: Absence of Hospital-Acquired Illness or Injury  Outcome: Progressing  Goal: Optimal Comfort and Wellbeing  Outcome: Progressing  Goal: Readiness for Transition of Care  Outcome: Progressing  Goal: Rounds/Family Conference  Outcome: Progressing     Problem: Wound  Goal: Optimal Wound Healing  Outcome: Progressing     Problem: Fall Injury Risk  Goal: Absence of Fall and Fall-Related Injury  Outcome: Progressing     Problem: Self-Care Deficit  Goal: Improved Ability to Complete Activities of Daily Living  Outcome: Progressing     Problem: Diabetes Comorbidity  Goal: Blood Glucose Level Within Desired Range  Outcome: Progressing     Problem: Hypertension Comorbidity  Goal: Blood Pressure in Desired Range  Outcome: Progressing     Problem: Pain Chronic (Persistent) (Comorbidity Management)  Goal: Acceptable Pain Control and Functional Ability  Outcome: Progressing

## 2018-05-16 NOTE — Unmapped (Signed)
Alert and oriented 4x, EEG continued without noted seizure activities. Bedrest at present without complaint of SOB or chest pain. K level down to 5.1, SR with 1 st degree AVB. NS infusion complete, monitoring renal status currently, urine sample sent to urologist. No fall or injury, cath site benign.   Problem: Adult Inpatient Plan of Care  Goal: Plan of Care Review  Outcome: Progressing  Goal: Patient-Specific Goal (Individualization)  Outcome: Progressing  Goal: Absence of Hospital-Acquired Illness or Injury  Outcome: Progressing  Goal: Optimal Comfort and Wellbeing  Outcome: Progressing  Goal: Readiness for Transition of Care  Outcome: Progressing  Goal: Rounds/Family Conference  Outcome: Progressing     Problem: Fall Injury Risk  Goal: Absence of Fall and Fall-Related Injury  Outcome: Progressing     Problem: Diabetes Comorbidity  Goal: Blood Glucose Level Within Desired Range  Outcome: Progressing     Problem: Pain Chronic (Persistent) (Comorbidity Management)  Goal: Acceptable Pain Control and Functional Ability  Outcome: Progressing

## 2018-05-16 NOTE — Unmapped (Signed)
Patient: Jeremy Oliver  Date of Birth: Sep 19, 1970  Attending: Asher Muir, M.D.  Ordering Provider: Chong Sicilian                                  [facility] No:  [id number]    DATE STARTED: 05/14/2018  22:41  DATE ENDED: 05/16/2018  11:29    HISTORY   48 y.o. male with HTN,??HLD,??T2DM, morbid obesity, CKD, CAD, STEMI in 2018 with VT arrest,??ischemic cardiomyopathy??with ICD and paroxysmal afib was admitted on 05/11/2018 with NSTEMI.   On 05/14/2018 he had acute chest pain and episodes of brief staring and difficulty following commands.    PROCEDURE:   Continuous EEG with simultaneous video recording was performed utilizing 21 active electrodes placed according to the international 10-20 system.  The study was recorded digitally with a bandpass of 1-70Hz  and a sampling rate of 200Hz  and was reviewed with the possibility of multiple reformatting.  The study was digitally processed with potential spike and seizure events identified for physician analysis and review.  Patient recognized events were identified by a push button marker and reviewed by the physician.   Simultaneous video was reviewed for all patient events.      TECHNICAL DESCRIPTION   05/14/2018 at 22: 41 to 05/15/2018 at 08: 00  During maximal wakefulness the posterior dominant rhythm is 9 Hz with a 50 ??V amplitude.  The background is symmetric and composed of an admixture of alpha and theta range activity.  There are brief periods of drowsiness identified by loss of muscle artifact and slowing of the background, but no cycling into deeper stages of sleep.    No epileptiform activity is identified and no events are reported.      05/15/2018 at 08: 00 to 05/16/2018 at 11:29  During maximal wakefulness the posterior dominant rhythm is 9 Hz with a 50 ??V amplitude.  The background is symmetric and composed of an admixture of alpha and theta range activity.  There are brief periods of drowsiness and stage II sleep, but no cycling into deeper stages of sleep.  Data is missing from 04:30 to 05:17.    No epileptiform activity is identified and no events are reported.      IMPRESSION   Normal continuous EEG monitoring with video.    CLINICAL INTERPRETATION   A normal study does not exclude seizures or epilepsy.

## 2018-05-16 NOTE — Unmapped (Signed)
Brief Inpatient Consult Note         Recommendations          Jeremy Oliver is a 48 y.o. male with hx of CAD, STEMI in 2018 complicated by cardiogenic shock following VT arrest with emergent PCI to mid LAD,??ischemic cardiomyopathy??with ICD [facility]),??CKD, paroxysmal afib, HTN,??HLD,??T2DM, and morbid obesity.  on whom I have been asked by Vanetta Mulders, MD to consult for altered mental status.  ??  Altered mental status: The patient has had multiple episodes of altered mental status starting 2/13 evening.  From description, it is possible that the patient had seizure activity causing transient alteration in consciousness.  However, when he was observed during 1 of these events, and he would repeatedly avoid his face with hand drop suggesting a functional exam.  Also, given his cardiac history with recent cath and stent placement as well as a history of atrial fibrillation, it is possible that the patient had arrhythmias causing alteration in consciousness. He does not appear to have any new weakness on examination. He states that his left lower extremity weakness and numbness is unchanged from his baseline and has been present for years. cvEEG obtained does not show epileptiform discharges. No epileptiform activity is identified, however, no events are reported. Patient has returned to baseline hence we do not suggest further neurological work up.  ??  Recommendations:  - Discontinue cvEEG (paged EEG tech)  - No further neurological work up indicated currently  - Arrhythmia and metabolic work up per primary team    Neurology will sign off at this point. Please page the consult team [phone] with additional questions/concerns.  ??  This patient was discussed with Dr. Seward Meth who agrees to the above assessment and plan.    Tawnya Crook, MD, MPH  PGY-2 Neurology Resident

## 2018-05-16 NOTE — Unmapped (Signed)
Advanced Heart Failure/Transplant/LVAD??(MDD) Cardiology Progress Note  ??  Patient Name:??Jeremy Oliver  MRN:??[id number]  Date of Service:??May 16, 2018   ??  Reason for Admission:   Jeremy Oliver??is a 48 y.o.??male??with hx of CAD, STEMI in 2018 complicated by cardiogenic shock following VT arrest with emergent PCI to mid LAD,??ischemic cardiomyopathy??with ICD [facility]),??CKD, paroxysmal afib, HTN,??HLD,??T2DM, and morbid obesity. He presents to [facility] ED on 02/11 as a transfer from [facility] for workup of NSTEMI.       Assessment and Plan:     NSTEMI:??Troponin elevated and peaked at 1.09 on 02/11. Patient with significant CAD history and has known stenosis in multiple vessels. The rise in Troponin with no ST segment elevation on EKG is c/w NSTEMI. Patient is now s/p LHC with stent to LAD (02/12). Troponin decreased to 0.409 yesterday evening.??  - holding losartan until seen outpatient next week (in the setting of Cr bump post contrast)  - Coreg 25 mg daily and 50 mg nightly  - Xarelto 15 mg daily (home dose)  - Brilinta 90 mg BID (home dose)  - Atorvastatin 80 mg daily  ??- telemetry ??  ??  Heart Failure with Reduced Ejection Fraction  Last TTE??-??positive for newly decreased LVEF of 45-50% with mild LV hypertrophy and anteroseptal segmental wall motion abnormality.??  - holding home Bumex 2 mg daily starting 02/13 in the setting of increasing Cr  - holding home losartan 50mg  daily starting 02/13 in the setting of increasing Cr  ??  Hypertension:??Pressures at [facility] were in the 200s systolic. Pressures here on admission have been in the 160s-170s systolic.??Blood pressure is now better controlled with PO meds. ??  -??Continue??Norvasc 10mg .??  - Coreg 25 mg??in morning and 50 mg at night  - amlodipine 10 mg daily  -??increased??Hydralazine to 50 mg TID on 02/15,  -  Imdur 30 mg    - losartan 50mg  daily (Losartan held starting 02/13 with Cr bump and to be resumed per outpatient assessment with cards next week) ??  Paroxsymal A. Fib: amiodarone keeping afib under control. TSH elevated to 5.06 yesterday but T4 is normal at 1.10. Recommend continuing amio at this time and then following Thyroid function at outpatient follow-up.  - continue home Amiodarone 200 mg daily  ??  CKD:??Cr at baseline on admission, but has been increasing s/p??LHC contrast.    - continue to monitor Cr  - nephrology consulted  - low K diet  ??  T2DMl: Hx of T2DM, glucose elvated to 485 on admission to [place]. Glucose trending downward to 272 upon recheck.??Glucose this AM was??168.??Will??continue??him on Lantus 25??BID and sliding scale insulin as sugars have been much better controlled.  - Lantus 25 BID  - sliding scale insulin    AKI on CKD: baseline cr fluctuates between 3-3.7. 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  Hyperlipidemia: Lipids on admission remarkable for triglycerides 733, total cholesterol 229, HDL 27.his home medications include Praluent inject q14 days, and zetia. He just received his praluent shot a few days ago so will continue his atorvastatin 80 mg daily and Zetia 10 mg.??Triglycerides on recheck were 654. We feel that at this level, he is not at risk for acute pancreatitis but  should definitely be followed as outpatient.??  -cont Zetia 10 mg and atorvastatin 80 mg  -Restart Praluent q14 days on discharge  ??  Left Shoulder pain:??Quite tender to palpation on exam, [name] be some MSK component to ongoing pain but unable to do a full exam due to pain. Received lidocaine patch. Pain worsening after syncopal event so more likely to be cardiac etiology at this point. Will continue to monitor??  ??- lidocaine patch PRN  ??  #) Prophylaxis:??on anticoagulation PO meds??  #) Code Status: FULL CODE verified on admission      Interval History/Subjective:   No acute events overnight  Denies chest pain or difficulty breathing. Had 3 episodes of post-tussive emesis, non-bloody, non-bilious.  Tolerating PO well. Good UOP.  Cr rise is slowing down. No indication for dialysis at this time per nephrology     Objective:     Medications:  ??? amiodarone  200 mg Oral Daily   ??? amLODIPine  10 mg Oral Daily   ??? atorvastatin  80 mg Oral Daily   ??? carvediloL  25 mg Oral QAM AC   ??? carvediloL  50 mg Oral Nightly   ??? ezetimibe  10 mg Oral Nightly   ??? FLUoxetine  60 mg Oral Nightly   ??? hydrALAZINE  25 mg Oral TID   ??? insulin glargine  25 Units Subcutaneous BID   ??? insulin regular  0-12 Units Subcutaneous ACHS   ??? isosorbide mononitrate  30 mg Oral Daily   ??? lidocaine  1 patch Transdermal Daily   ??? melatonin  9 mg Oral Nightly   ??? polyethylene glycol  17 g Oral Daily   ??? rivaroxaban  15 mg Oral Daily   ??? senna  2 tablet Oral Nightly   ??? sevelamer  1,600 mg Oral 3xd Meals   ??? ticagrelor  90 mg Oral BID       acetaminophen, albuterol, cyclobenzaprine, cyclobenzaprine, dextromethorphan-guaifenesin, dextrose, lactulose, nitroglycerin, ondansetron    Physical Examination:  Temp:  [36.8 ??C-37 ??C] 37 ??C  Heart Rate:  [84-123] 123  Resp:  [19-20] 19  BP: (91-135)/(52-66) 130/66  MAP (mmHg):  [66-90] 90  SpO2:  [91 %-98 %] 91 %    Height: 188 cm (6' 2)  Body mass index is 45.66 kg/m??.  Wt Readings from Last 3 Encounters:   05/15/18 (!) 161.3 kg (355 lb 9.6 oz)   05/11/18 (!) 158.9 kg (350 lb 6.4 oz)   04/23/18 (!) 161.7 kg (356 lb 6.4 oz)     General: ??Sitting up in bed. Morbidly obese male. No acute distress  HEENT: ??NCAT. MMM  Lungs: CTA B/L , no increased work of breathing  CV: RRR  Abd: Soft, NT. Mildly distended  ZOX:WRUE and well perfused  Neuro: awake, alert and oriented x3    Intake/Output Summary (Last 24 hours) at 05/16/2018 1344  Last data filed at 05/16/2018 0900  Gross per 24 hour   Intake 1350 ml   Output 1475 ml Net -125 ml     I/O last 3 completed shifts:  In: 1636 [P.O.:1636]  Out: 3175 [Urine:3175]  I/O       02/14 0701 - 02/15 0700 02/15 0701 - 02/16 0700 02/16 0701 - 02/17 0700    P.O. 715 1516 270    Total Intake 715 1516 270    Urine (mL/kg/hr) 1550 (0.4) 1625 (0.4) 350 (0.3)    Emesis/NG output  0     Stool  0  Total Output(mL/kg) 1550 (9.6) 1625 (10.1) 350 (2.2)    Net -835 -109 -80           Urine Occurrence 2 x      Stool Occurrence  1 x     Emesis Occurrence  2 x              Labs & Imaging:  Reviewed in EPIC.   Lab Results   Component Value Date    WBC 9.1 05/16/2018    HGB 9.4 (L) 05/16/2018    HCT 29.6 (L) 05/16/2018    PLT 230 05/16/2018     Lab Results   Component Value Date    NA 137 05/16/2018    K 4.7 05/16/2018    CL 106 05/16/2018    CO2 19.0 (L) 05/16/2018    BUN 44 (H) 05/16/2018    CREATININE 5.77 (H) 05/16/2018    GLU 104 05/16/2018    CALCIUM 8.5 05/16/2018    MG 1.9 05/16/2018    PHOS 5.7 (H) 05/15/2018     Lab Results   Component Value Date    BILITOT 0.2 05/11/2018    BILIDIR 0.50 (H) 04/07/2017    PROT 6.7 05/11/2018    ALBUMIN 3.4 (L) 05/11/2018    ALT 11 05/11/2018    AST 17 (L) 05/11/2018    ALKPHOS 94 05/11/2018    GGT 32 01/09/2012     Lab Results   Component Value Date    LABPROT 10.5 10/16/2013    INR 1.03 05/11/2018    APTT 69.9 (H) 05/12/2018        Lab Results   Component Value Date    INR, POC 1.2 02/12/2018    INR, POC 0.9 02/01/2018    INR 1.03 05/11/2018    INR 0.95 05/11/2018    LDH 1,231 (H) 03/21/2017    LDH 4,526 (H) 03/18/2017    PRO-BNP 1,670.0 (H) 05/11/2018    PRO-BNP 551.0 (H) 11/22/2017    PRO-BNP 231 (H) 06/08/2014

## 2018-05-17 LAB — BASIC METABOLIC PANEL
ANION GAP: 12 mmol/L (ref 7–15)
ANION GAP: 12 mmol/L (ref 7–15)
BLOOD UREA NITROGEN: 45 mg/dL — ABNORMAL HIGH (ref 7–21)
BUN / CREAT RATIO: 8
CALCIUM: 8.4 mg/dL — ABNORMAL LOW (ref 8.5–10.2)
CALCIUM: 8.4 mg/dL — ABNORMAL LOW (ref 8.5–10.2)
CHLORIDE: 105 mmol/L (ref 98–107)
CHLORIDE: 107 mmol/L (ref 98–107)
CO2: 18 mmol/L — ABNORMAL LOW (ref 22.0–30.0)
CREATININE: 5.43 mg/dL — ABNORMAL HIGH (ref 0.70–1.30)
EGFR CKD-EPI AA MALE: 13 mL/min/{1.73_m2} — ABNORMAL LOW (ref >=60)
EGFR CKD-EPI AA MALE: 13 mL/min/{1.73_m2} — ABNORMAL LOW (ref >=60)
EGFR CKD-EPI NON-AA MALE: 11 mL/min/{1.73_m2} — ABNORMAL LOW (ref >=60)
EGFR CKD-EPI NON-AA MALE: 11 mL/min/{1.73_m2} — ABNORMAL LOW (ref >=60)
GLUCOSE RANDOM: 191 mg/dL — ABNORMAL HIGH (ref 70–179)
GLUCOSE RANDOM: 123 mg/dL (ref 70–179)
POTASSIUM: 4.8 mmol/L (ref 3.5–5.0)
POTASSIUM: 4.7 mmol/L (ref 3.5–5.0)
SODIUM: 135 mmol/L (ref 135–145)
SODIUM: 137 mmol/L (ref 135–145)

## 2018-05-17 LAB — CBC
HEMATOCRIT: 29.3 % — ABNORMAL LOW (ref 41.0–53.0)
HEMOGLOBIN: 9.3 g/dL — ABNORMAL LOW (ref 13.5–17.5)
MEAN CORPUSCULAR HEMOGLOBIN CONC: 31.8 g/dL (ref 31.0–37.0)
MEAN CORPUSCULAR VOLUME: 85.4 fL (ref 80.0–100.0)
MEAN PLATELET VOLUME: 8.4 fL (ref 7.0–10.0)
PLATELET COUNT: 265 10*9/L (ref 150–440)
RED CELL DISTRIBUTION WIDTH: 15.3 % — ABNORMAL HIGH (ref 12.0–15.0)
WBC ADJUSTED: 7.6 10*9/L (ref 4.5–11.0)

## 2018-05-17 LAB — MAGNESIUM: Magnesium:MCnc:Pt:Ser/Plas:Qn:: 1.9

## 2018-05-17 LAB — SODIUM: Sodium:SCnc:Pt:Ser/Plas:Qn:: 135

## 2018-05-17 LAB — RED CELL DISTRIBUTION WIDTH: Lab: 15.3 — ABNORMAL HIGH

## 2018-05-17 LAB — POTASSIUM: Potassium:SCnc:Pt:Ser/Plas:Qn:: 4.7

## 2018-05-17 NOTE — Unmapped (Signed)
Dr Andrey Farmer,     Lorain Childes.....  The wife called and wants to speak to you.    (She is delayed...  Also she is  angry about her husband's care, specifically when can he be discharged???   She kept saying there's a communication problem???)    Kaymarie Wynn

## 2018-05-17 NOTE — Unmapped (Signed)
General malaise with mild nausea and no appetite at present. States he will order dinner after his shower. EEG monitoring discontinued this afternoon. No seizure activity, SOB or chest pain. Alternating between atrial fib and NSR with stable BP. Wife states pending eviction from home causing much grief and stress at present. Case manager notified.   Problem: Adult Inpatient Plan of Care  Goal: Plan of Care Review  Outcome: Progressing  Goal: Patient-Specific Goal (Individualization)  Outcome: Progressing  Goal: Absence of Hospital-Acquired Illness or Injury  Outcome: Progressing  Goal: Optimal Comfort and Wellbeing  Outcome: Progressing  Goal: Readiness for Transition of Care  Outcome: Progressing  Goal: Rounds/Family Conference  Outcome: Progressing     Problem: Fall Injury Risk  Goal: Absence of Fall and Fall-Related Injury  Outcome: Progressing     Problem: Wound  Goal: Optimal Wound Healing  Outcome: Progressing     Problem: Fall Injury Risk  Goal: Absence of Fall and Fall-Related Injury  Outcome: Progressing     Problem: Diabetes Comorbidity  Goal: Blood Glucose Level Within Desired Range  Outcome: Progressing

## 2018-05-17 NOTE — Unmapped (Signed)
Advanced Heart Failure/Transplant/LVAD??(MDD) Cardiology Progress Note  ??  Patient Name:??Jeremy Oliver  MRN:??[id number]  Date of Service:??May 16, 2018   ??  Reason for Admission:   Jeremy Oliver??is a 48 y.o.??male??with hx of CAD, STEMI in 2018 complicated by cardiogenic shock following VT arrest with emergent PCI to mid LAD,??ischemic cardiomyopathy??with ICD [facility]),??CKD, paroxysmal afib, HTN,??HLD,??T2DM, and morbid obesity. He presents to [facility] ED on 02/11 as a transfer from [facility] for workup of NSTEMI.       Assessment and Plan:     NSTEMI:??Troponin elevated and peaked at 1.09 on 02/11. Patient with significant CAD history and has known stenosis in multiple vessels. The rise in Troponin with no ST segment elevation on EKG is c/w NSTEMI. Patient is now s/p LHC with stent to LAD (02/12). Asymptomatic now.   - holding losartan until seen outpatient next week (in the setting of Cr bump post contrast)  - Coreg 25 mg daily and 50 mg nightly  - Xarelto 15 mg daily (home dose)  - Brilinta 90 mg BID (home dose)  - Atorvastatin 80 mg daily  ??- telemetry ??  ??  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 Creatinine peaked at approximately 5.8. Stable today.   -appreciate nephrology consult   -hold losartan   -trend daily BMP; safe for dc on 2/18 if renal function improving     Heart Failure with Reduced Ejection Fraction  Last TTE??-??positive for newly decreased LVEF of 45-50% with mild LV hypertrophy and anteroseptal segmental wall motion abnormality.??  -coreg as above; resume ARB when safe from renal perspective   ??  Hypertension:??Pressures at [facility] were in the 200s systolic. Pressures here on admission have been in the 160s-170s systolic.??Blood pressure is now better controlled with PO meds. ??  -??Continue??Norvasc 10mg .??  - Coreg 25 mg??in morning and 50 mg at night  - amlodipine 10 mg daily  -??increased??Hydralazine to 50 mg TID on 02/15  -  Imdur 30 mg    - hold losartan   ??  Paroxsymal A. Fib: amiodarone keeping afib under control. TSH elevated to 5.06 yesterday but T4 is normal at 1.10. Recommend continuing amio at this time and then following Thyroid function at outpatient follow-up.  - continue home Amiodarone 200 mg daily  ??  T2DMl: Hx of T2DM, glucose elvated to 485 on admission to [place]. Glucose trending downward to 272 upon recheck.??Glucose this AM was??168.??Will??continue??him on Lantus 25??BID and sliding scale insulin as sugars have been much better controlled.  - Lantus 25 BID  - sliding scale insulin  ??  Hyperlipidemia: Lipids on admission remarkable for triglycerides 733, total cholesterol 229, HDL 27.his home medications include Praluent inject q14 days, and zetia. He just received his praluent shot a few days ago so will continue his atorvastatin 80 mg daily and Zetia 10 mg.??Triglycerides on recheck were 654. We feel that at this level, he is not at risk for acute pancreatitis but should definitely be followed as outpatient.??  -cont Zetia 10 mg and atorvastatin 80 mg  -Restart Praluent q14 days on discharge  ??  Left Shoulder pain:??Quite tender to palpation on exam, [place] be some MSK component to ongoing pain but unable to do a full exam due to pain. Received lidocaine patch. Pain worsening after syncopal event so more likely to be cardiac etiology at this point. Will continue to monitor??  ??- lidocaine patch PRN  ??  #) Prophylaxis:??on anticoagulation PO meds??  #)  Code Status: FULL CODE verified on admission      Interval History/Subjective:   No acute events overnight  Denies chest pain or difficulty breathing.  States he wants to leave and initially not willing to stay another night.      Objective:     Medications:  ??? amiodarone  200 mg Oral Daily   ??? amLODIPine  10 mg Oral Daily   ??? atorvastatin  80 mg Oral Daily   ??? carvediloL  25 mg Oral QAM AC   ??? carvediloL  50 mg Oral Nightly   ??? ezetimibe  10 mg Oral Nightly   ??? FLUoxetine  60 mg Oral Nightly ??? hydrALAZINE  25 mg Oral TID   ??? insulin glargine  25 Units Subcutaneous BID   ??? insulin regular  0-12 Units Subcutaneous ACHS   ??? isosorbide mononitrate  30 mg Oral Daily   ??? lidocaine  1 patch Transdermal Daily   ??? melatonin  9 mg Oral Nightly   ??? polyethylene glycol  17 g Oral Daily   ??? rivaroxaban  15 mg Oral Daily   ??? senna  2 tablet Oral Nightly   ??? sevelamer  1,600 mg Oral 3xd Meals   ??? ticagrelor  90 mg Oral BID       acetaminophen, albuterol, cyclobenzaprine, cyclobenzaprine, dextromethorphan-guaifenesin, dextrose, lactulose, nitroglycerin    Physical Examination:  Temp:  [36.7 ??C-36.9 ??C] 36.7 ??C  Heart Rate:  [79-93] 83  Resp:  [18-20] 18  BP: (104-141)/(58-74) 141/74  MAP (mmHg):  [73-101] 101  SpO2:  [93 %-95 %] 95 %    Height: 188 cm (6' 2)  Body mass index is 45.94 kg/m??.  Wt Readings from Last 3 Encounters:   05/16/18 (!) 162.3 kg (357 lb 12.9 oz)   05/11/18 (!) 158.9 kg (350 lb 6.4 oz)   04/23/18 (!) 161.7 kg (356 lb 6.4 oz)     General: ??Sitting up in bed. Morbidly obese male. No acute distress  HEENT: ??NCAT. MMM  Lungs: CTA B/L , no increased work of breathing  CV: RRR  Abd: Soft, NT. Mildly distended  RUE:AVWU and well perfused  Neuro: awake, alert and oriented x3    Intake/Output Summary (Last 24 hours) at 05/17/2018 1320  Last data filed at 05/17/2018 1200  Gross per 24 hour   Intake 2173 ml   Output 2025 ml   Net 148 ml     I/O last 3 completed shifts:  In: 3405 [P.O.:3405]  Out: 2350 [Urine:2350]  I/O       02/15 0701 - 02/16 0700 02/16 0701 - 02/17 0700 02/17 0701 - 02/18 0700    P.O. 1516 2325 118    Total Intake 1516 2325 118    Urine (mL/kg/hr) 1625 (0.4) 1725 (0.4) 850 (0.8)    Emesis/NG output 0      Stool 0 0 0    Total Output(mL/kg) 1625 (10.1) 1725 (10.6) 850 (5.2)    Net -109 +600 -732           Urine Occurrence  2 x 1 x    Stool Occurrence 1 x 1 x 1 x    Emesis Occurrence 2 x               Labs & Imaging:  Reviewed in EPIC.   Lab Results   Component Value Date    WBC 7.6 05/17/2018 HGB 9.3 (L) 05/17/2018    HCT 29.3 (L) 05/17/2018    PLT 265 05/17/2018  Lab Results   Component Value Date    NA 135 05/17/2018    K 4.8 05/17/2018    CL 105 05/17/2018    CO2 18.0 (L) 05/17/2018    BUN 46 (H) 05/17/2018    CREATININE 5.43 (H) 05/17/2018    GLU 191 (H) 05/17/2018    CALCIUM 8.4 (L) 05/17/2018    MG 1.9 05/17/2018    PHOS 5.7 (H) 05/15/2018     Lab Results   Component Value Date    BILITOT 0.2 05/11/2018    BILIDIR 0.50 (H) 04/07/2017    PROT 6.7 05/11/2018    ALBUMIN 3.4 (L) 05/11/2018    ALT 11 05/11/2018    AST 17 (L) 05/11/2018    ALKPHOS 94 05/11/2018    GGT 32 01/09/2012     Lab Results   Component Value Date    LABPROT 10.5 10/16/2013    INR 1.03 05/11/2018    APTT 69.9 (H) 05/12/2018        Lab Results   Component Value Date    INR, POC 1.2 02/12/2018    INR, POC 0.9 02/01/2018    INR 1.03 05/11/2018    INR 0.95 05/11/2018    LDH 1,231 (H) 03/21/2017    LDH 4,526 (H) 03/18/2017    PRO-BNP 1,670.0 (H) 05/11/2018    PRO-BNP 551.0 (H) 11/22/2017    PRO-BNP 231 (H) 06/08/2014     Virgel Paling, MD  [facility]

## 2018-05-17 NOTE — Unmapped (Signed)
Jeremy Oliver continues to have periods of coughing and vomiting. I gave him cough syrup without relief. A social work consult was placed yesterday in an effort to assist him and his wife with finding housing. No chest pain. No seizure like activity.     Problem: Adult Inpatient Plan of Care  Goal: Plan of Care Review  Outcome: Progressing  Goal: Patient-Specific Goal (Individualization)  Outcome: Progressing  Goal: Absence of Hospital-Acquired Illness or Injury  Outcome: Progressing  Goal: Optimal Comfort and Wellbeing  Outcome: Progressing  Goal: Readiness for Transition of Care  Outcome: Progressing  Goal: Rounds/Family Conference  Outcome: Progressing     Problem: Wound  Goal: Optimal Wound Healing  Outcome: Progressing     Problem: Fall Injury Risk  Goal: Absence of Fall and Fall-Related Injury  Outcome: Progressing     Problem: Self-Care Deficit  Goal: Improved Ability to Complete Activities of Daily Living  Outcome: Progressing     Problem: Diabetes Comorbidity  Goal: Blood Glucose Level Within Desired Range  Outcome: Progressing     Problem: Hypertension Comorbidity  Goal: Blood Pressure in Desired Range  Outcome: Progressing     Problem: Pain Chronic (Persistent) (Comorbidity Management)  Goal: Acceptable Pain Control and Functional Ability  Outcome: Progressing

## 2018-05-17 NOTE — Unmapped (Signed)
Social Work Progress Note    Consult addressed. Please see previous SW consult note.     Terese Door 05/17/2018 11:16 AM

## 2018-05-18 LAB — BASIC METABOLIC PANEL
ANION GAP: 12 mmol/L (ref 7–15)
BLOOD UREA NITROGEN: 42 mg/dL — ABNORMAL HIGH (ref 7–21)
BUN / CREAT RATIO: 9
CALCIUM: 8.5 mg/dL (ref 8.5–10.2)
CO2: 19 mmol/L — ABNORMAL LOW (ref 22.0–30.0)
CREATININE: 4.78 mg/dL — ABNORMAL HIGH (ref 0.70–1.30)
EGFR CKD-EPI AA MALE: 15 mL/min/{1.73_m2} — ABNORMAL LOW (ref >=60)
EGFR CKD-EPI NON-AA MALE: 13 mL/min/{1.73_m2} — ABNORMAL LOW (ref >=60)
GLUCOSE RANDOM: 95 mg/dL (ref 70–179)
POTASSIUM: 4.7 mmol/L (ref 3.5–5.0)
SODIUM: 139 mmol/L (ref 135–145)

## 2018-05-18 LAB — MAGNESIUM: Magnesium:MCnc:Pt:Ser/Plas:Qn:: 1.9

## 2018-05-18 LAB — CBC
HEMATOCRIT: 29.8 % — ABNORMAL LOW (ref 41.0–53.0)
HEMOGLOBIN: 9.6 g/dL — ABNORMAL LOW (ref 13.5–17.5)
MEAN CORPUSCULAR HEMOGLOBIN: 27.3 pg (ref 26.0–34.0)
MEAN CORPUSCULAR VOLUME: 85.1 fL (ref 80.0–100.0)
MEAN PLATELET VOLUME: 8.1 fL (ref 7.0–10.0)
PLATELET COUNT: 291 10*9/L (ref 150–440)
RED CELL DISTRIBUTION WIDTH: 15.6 % — ABNORMAL HIGH (ref 12.0–15.0)
WBC ADJUSTED: 6.9 10*9/L (ref 4.5–11.0)

## 2018-05-18 LAB — MEAN PLATELET VOLUME: Lab: 8.1

## 2018-05-18 LAB — BLOOD UREA NITROGEN: Urea nitrogen:MCnc:Pt:Ser/Plas:Qn:: 42 — ABNORMAL HIGH

## 2018-05-18 NOTE — Unmapped (Signed)
48 yo male transfer from [facility]  for NSTEMI with worsening Renal functioning.  Plan is for D/C today if Crt is continuing to drop. During this shift pt c/o shortness for breath with O2 sat 91-95% on RA instructed pt to raise [facility] states I can't sleep like that. No s/s of distress at time of complaint.    Problem: Adult Inpatient Plan of Care  Goal: Plan of Care Review  Outcome: Ongoing - Unchanged  Goal: Patient-Specific Goal (Individualization)  Outcome: Ongoing - Unchanged  Goal: Absence of Hospital-Acquired Illness or Injury  Outcome: Ongoing - Unchanged  Goal: Optimal Comfort and Wellbeing  Outcome: Ongoing - Unchanged  Goal: Readiness for Transition of Care  Outcome: Ongoing - Unchanged  Goal: Rounds/Family Conference  Outcome: Ongoing - Unchanged     Problem: Wound  Goal: Optimal Wound Healing  Outcome: Ongoing - Unchanged     Problem: Fall Injury Risk  Goal: Absence of Fall and Fall-Related Injury  Outcome: Ongoing - Unchanged     Problem: Self-Care Deficit  Goal: Improved Ability to Complete Activities of Daily Living  Outcome: Ongoing - Unchanged     Problem: Diabetes Comorbidity  Goal: Blood Glucose Level Within Desired Range  Outcome: Ongoing - Unchanged     Problem: Hypertension Comorbidity  Goal: Blood Pressure in Desired Range  Outcome: Ongoing - Unchanged     Problem: Pain Chronic (Persistent) (Comorbidity Management)  Goal: Acceptable Pain Control and Functional Ability  Outcome: Ongoing - Unchanged

## 2018-05-18 NOTE — Unmapped (Signed)
I was the supervising physician in the delivery of the service. Rodderick Holtzer D Mckinleigh Schuchart, MD

## 2018-05-18 NOTE — Unmapped (Signed)
Problem: Adult Inpatient Plan of Care  Goal: Plan of Care Review  Outcome: Progressing  Goal: Patient-Specific Goal (Individualization)  Outcome: Progressing  Goal: Absence of Hospital-Acquired Illness or Injury  Outcome: Progressing  Goal: Optimal Comfort and Wellbeing  Outcome: Progressing  Goal: Readiness for Transition of Care  Outcome: Progressing  Goal: Rounds/Family Conference  Outcome: Progressing     Problem: Wound  Goal: Optimal Wound Healing  Outcome: Progressing     Problem: Fall Injury Risk  Goal: Absence of Fall and Fall-Related Injury  Outcome: Progressing     Problem: Self-Care Deficit  Goal: Improved Ability to Complete Activities of Daily Living  Outcome: Progressing     Problem: Diabetes Comorbidity  Goal: Blood Glucose Level Within Desired Range  Outcome: Progressing     Problem: Hypertension Comorbidity  Goal: Blood Pressure in Desired Range  Outcome: Progressing     Problem: Pain Chronic (Persistent) (Comorbidity Management)  Goal: Acceptable Pain Control and Functional Ability  Outcome: Progressing    VSS, denies current discomfort. Continuing to monitor Creatinine, continues to downtrend.  Plan for continued monitoring of renal function and possible discharge 2/18.  Pt ambulated with 1 assist to restroom, bed alarm activated.  Bed in low position, call bell within reach, will continue to monitor.

## 2018-05-18 NOTE — Unmapped (Signed)
CCM Intake call, wife Jeremy Oliver      PCP Action:None     CCM Action:None    Patient Action:f/u appts this week, wife aware of them all. 3 appts 05/21/18    Jeremy Oliver states Jeremy Oliver has been throwing up while inpatient and is concerned about him coming home.  After further discussion w/ wife CCM  understand she last saw him on Saturday 05/22/18.    CCM encouraged wife that pt will not be sent home if not tolerating food intake.  Jeremy Oliver believes pt's greatest need now is Diane SW. Would like SW to be able to speak to Jeremy Oliver more frequently and improve his outlook for wanting to take care of himself.    Jeremy Oliver DOES have Medicaid now, it is back dated to Jan. 2020.  Jeremy Oliver still not working, has MRI of knee Monday 2/24 and Treatment plan review on Thurs. 05/27/18- currently she is not eligible for Medicaid.    CCM asked Jeremy Oliver to call pt's Medicaid Case Manager to discuss all benefits he has.

## 2018-05-18 NOTE — Unmapped (Signed)
Physician Discharge Summary [facility]  3 AD [facility]  [address]  [place] [address]  Dept: [phone]  Loc: [phone]     Identifying Information:   Shawn Route Kunde  February 06, 1971  [id number]    Primary Care Physician: Kurtis Bushman, MD   Code Status: Full Code    Admit Date: 05/11/2018    Discharge Date: 05/18/2018     Discharge To: Home    Discharge Service: CARD Anderson 1     Discharge Attending Physician: Vanetta Mulders, MD    Discharge Diagnoses:  Active Problems:    Tobacco use disorder POA: Yes  Resolved Problems:    * No resolved hospital problems. *      Outpatient Provider Follow Up Issues:   1. Patient would benefit from repeat labs to monitor downward trend of Cr.     Hospital Course:   Shawn Route Dials??is a 48 y.o.??male??with hx of CAD, STEMI in 2018 complicated by cardiogenic shock following VT arrest with emergent PCI to mid LAD,??Ischemic cardiomyopathy??with ICD [facility]),??CKD, paroxysmal afib, HTN,??HLD,??T2DM, and morbid obesity. He presents to [facility] ED on 02/11 as a transfer from [facility] for workup of NSTEMI.     NSTEMI:??Troponin elevated and peaked at 1.09 on 02/11. Patient with significant CAD history and has known stenosis in multiple vessels. The rise in Troponin with no ST segment elevation on EKG is c/w NSTEMI. Patient is now s/p LHC (02/12 /2020), with successful PCI to the proximal to mid LAD with the placement of a Synergy drug eluting stent with excellent angiographic result and TIMI 3 flow.   In the setting of Cr bump post heart cath contrast, we are holding losartan until seen outpatient next week. An echocardiogram was done on 05/13/2018 and showed mildly decreased left ventricular systolic function, ejection fraction 45 to 50%. We continued the following medications:  - Coreg 25 mg daily and 50 mg nightly  - Xarelto 15 mg daily (home dose)  - Brilinta 90 mg BID (home dose)  - Atorvastatin 80 mg daily  - holding losartan   ??  Hypertension:??Pressures at [facility] were in the 200s systolic. Pressures here on admission have been in the 160s-170s systolic.??Blood pressure is now better controlled with PO meds.    - Coreg 25 mg??in morning and 50 mg at night  - amlodipine 10 mg daily  -??continue??Hydralazine 25 mg TID, Imdur 30 mg and losartan 50mg  daily   - hold losartan on dc given AKI on CKD    Paroxsymal A. Fib: He has a history of Paroxysmal Afib with ICD placement. Device was interrogated recently and was found to have <1% afib burden. His home dose of Amiodarone is 200 mg daily and seems to be controlling Afib well. TSH was elevated on admission to 5.060 with normal T4 at 1.10.   - continue home Amiodarone 200 mg daily  ??  AKI on CKD:??Cr at baseline on admission, but bumped to peak of 5.8 on 05/16/2018, felt 2/2 contrast induced nephropathy following LHC/PCI. Nephrology was consulted. Losartan was held. Dialysis was not required. His renal function downtrended to 5.4 on 2/17.   -hold losartan at discharge   -repeat labs on 05/18/2018 as outpatient to ensure continued downtrending  ??  T2DMl: Hx of T2DM, glucose elvated to 485 on admission to [place]. Home lantus continued.   ??  Hyperlipidemia: Lipids on admission remarkable for triglycerides 733, total cholesterol 229, HDL 27.his home medications include Praluent inject q14 days, and zetia. He just  received his praluent shot a few days ago so will continue his atorvastatin 80 mg daily and Zetia 10 mg.  ??        Touchbase with Outpatient Provider:  Warm Handoff: Completed on 05/17/18   by Dr. Orpah Cobb (Resident) via Phone    Procedures:  cardiac catheterization  No admission procedures for hospital encounter.  ______________________________________________________________________  Discharge Medications:     Your Medication List      CONTINUE taking these medications    acetaminophen 500 MG tablet  Commonly known as:  TYLENOL  TAKE 2 TABLETS (1 000MG ) BY MOUTH EVERY 8 HOURS AS NEEDED FOR PAIN allopurinoL 100 MG tablet  Commonly known as:  ZYLOPRIM  Take 0.5 tablets (50 mg total) by mouth every other day.     amiodarone 200 MG tablet  Commonly known as:  PACERONE  TAKE 1 TABLET BY MOUTH ONCE DAILY     amLODIPine 10 MG tablet  Commonly known as:  NORVASC  Take 1 tablet (10 mg total) by mouth daily.     atorvastatin 80 MG tablet  Commonly known as:  LIPITOR  Take 1 tablet (80 mg total) by mouth daily.     blood-glucose meter kit  Use to check blood sugars, #1 meter, #100 strips. Freestyle glucose meter. ICD-9 250.00     BRILINTA 90 mg Tab  Generic drug:  ticagrelor  TAKE 1 TABLET BY MOUTH TWICE DAILY     bumetanide 2 MG tablet  Commonly known as:  BUMEX  TAKE 1 TABLET BY MOUTH ONCE DAILY     carvediloL 25 MG tablet  Commonly known as:  COREG  TAKE 1 TABLET BY MOUTH WITH THE MORNING MEAL AND TAKE 2 TABLETS WITH THE EVENING MEAL     cholecalciferol (vitamin D3) 5,000 unit capsule  TAKE 1 CAPSULE (5 000 UNITS) BY MOUTH DAILY     diclofenac sodium 1 % gel  Commonly known as:  VOLTAREN  Apply 2 g topically Three (3) times a day as needed for pain.     empty container Misc  Use as directed to dispose of injectable medications     ezetimibe 10 mg tablet  Commonly known as:  ZETIA  TAKE 1 TABLET BY MOUTH ONCE DAILY     FLUoxetine 20 MG capsule  Commonly known as:  PROzac  Take 60 mg by mouth nightly.     FREESTYLE LITE STRIPS Strp  Generic drug:  blood sugar diagnostic  USE TO CHECK BLOOD SUGAR 4 TIMES DAILY (BEFORE MEALS AND NIGHTLY)     FREESTYLE NAVIGATOR GLUC SENS Devi  Generic drug:  blood-glucose sensor  Frequency:BID   Dosage:0.0     Instructions:  Note:Dose: 1     gabapentin 300 MG capsule  Commonly known as:  NEURONTIN  Take 2 capsules (600 mg total) by mouth nightly.     hydrALAZINE 25 MG tablet  Commonly known as:  APRESOLINE  Take 1 tablet (25 mg total) by mouth Three (3) times a day.     insulin detemir U-100 100 unit/mL (3 mL) injection pen  Commonly known as:  LEVEMIR  Inject 40 Units under the skin nightly.     isosorbide mononitrate 30 MG 24 hr tablet  Commonly known as:  IMDUR  Take 1 tablet (30 mg total) by mouth daily.     losartan 50 MG tablet  Commonly known as:  COZAAR  Take 1 tablet (50 mg total) by mouth daily.     melatonin 3  mg Cap  Take 9 mg by mouth nightly.     nitroglycerin 0.4 MG SL tablet  Commonly known as:  NITROSTAT  Place 1 tablet (0.4 mg total) under the tongue every five (5) minutes as needed for chest pain.     NovoLOG Flexpen U-100 Insulin 100 unit/mL (3 mL) injection pen  Generic drug:  insulin ASPART  Inject 0.15 mL (15 Units total) under the skin Three (3) times a day with a meal.     ON CALL LANCET 30 gauge Misc  Generic drug:  lancets  USE TO CHECK BLOOD SUGAR THREE TIMES DAILY WITH MEALS     OZEMPIC 0.25 mg or 0.5 mg(2 mg/1.5 mL) Pnij  Generic drug:  semaglutide  Inject 0.25 mg under the skin every seven (7) days. For 4 weeks then increase to 0.5mg  every 7 days     PRALUENT PEN 75 mg/mL Pnij  Generic drug:  alirocumab  INJECT THE CONTENTS OF 1 PEN (75 MG) UNDER THE SKIN EVERY 14 DAYS     QUEtiapine 50 MG tablet  Commonly known as:  SEROquel  Take 1 tablet (50 mg total) by mouth two (2) times a day as needed (headache).     rivaroxaban 15 mg Tab  Commonly known as:  XARELTO  Take 1 tablet (15 mg total) by mouth daily with evening meal.     sevelamer 800 mg tablet  Commonly known as:  RENVELA  TAKE 2 TABLETS (1600MG ) BY MOUTH THREE TIMES DAILY WITH MEALS     traMADol 50 mg tablet  Commonly known as:  ULTRAM  Take 1 tablet (50 mg total) by mouth every six (6) hours as needed.     TRUEPLUS INSULIN 0.3 mL 31 gauge x 5/16 Syrg  Generic drug:  insulin syringe-needle U-100  USE TO INJECT INSULIN TWICE DAILY WITH MEALS     UNIFINE PENTIPS 31 gauge x 5/16 Ndle  Generic drug:  pen needle, diabetic  USE WITH INSULIN AND VICTOZA 5 TIMES DAILY     VENTOLIN HFA 90 mcg/actuation inhaler  Generic drug:  albuterol  Inhale 2 puffs into the lungs every 6 (six) hours as needed for wheezing or shortness of breath.            Allergies:  Morphine  ______________________________________________________________________  Pending Test Results (if blank, then none):  Pending Labs     Order Current Status    Aldosterone In process          Most Recent Labs:  All lab results last 24 hours -   Recent Results (from the past 24 hour(s))   Basic Metabolic Panel    Collection Time: 05/17/18 12:45 PM   Result Value Ref Range    Sodium 137 135 - 145 mmol/L    Potassium 4.7 3.5 - 5.0 mmol/L    Chloride 107 98 - 107 mmol/L    CO2 18.0 (L) 22.0 - 30.0 mmol/L    Anion Gap 12 7 - 15 mmol/L    BUN 45 (H) 7 - 21 mg/dL    Creatinine 1.61 (H) 0.70 - 1.30 mg/dL    BUN/Creatinine Ratio 8     EGFR CKD-EPI Non-African American, Male 11 (L) >=60 mL/min/1.6m2    EGFR CKD-EPI African American, Male 13 (L) >=60 mL/min/1.95m2    Glucose 123 70 - 179 mg/dL    Calcium 8.4 (L) 8.5 - 10.2 mg/dL   POCT Glucose    Collection Time: 05/17/18  4:15 PM   Result Value Ref  Range    Glucose, POC 161 70 - 179 mg/dL   POCT Glucose    Collection Time: 05/17/18  9:12 PM   Result Value Ref Range    Glucose, POC 182 (H) 70 - 179 mg/dL   POCT Glucose    Collection Time: 05/18/18  5:52 AM   Result Value Ref Range    Glucose, POC 98 70 - 179 mg/dL   Basic Metabolic Panel    Collection Time: 05/18/18  7:06 AM   Result Value Ref Range    Sodium 139 135 - 145 mmol/L    Potassium 4.7 3.5 - 5.0 mmol/L    Chloride 108 (H) 98 - 107 mmol/L    CO2 19.0 (L) 22.0 - 30.0 mmol/L    Anion Gap 12 7 - 15 mmol/L    BUN 42 (H) 7 - 21 mg/dL    Creatinine 8.46 (H) 0.70 - 1.30 mg/dL    BUN/Creatinine Ratio 9     EGFR CKD-EPI Non-African American, Male 13 (L) >=60 mL/min/1.9m2    EGFR CKD-EPI African American, Male 15 (L) >=60 mL/min/1.105m2    Glucose 95 70 - 179 mg/dL    Calcium 8.5 8.5 - 96.2 mg/dL   CBC    Collection Time: 05/18/18  7:06 AM   Result Value Ref Range    WBC 6.9 4.5 - 11.0 10*9/L    RBC 3.50 (L) 4.50 - 5.90 10*12/L    HGB 9.6 (L) 13.5 - 17.5 g/dL    HCT 95.2 (L) 84.1 - 53.0 %    MCV 85.1 80.0 - 100.0 fL    MCH 27.3 26.0 - 34.0 pg    MCHC 32.0 31.0 - 37.0 g/dL    RDW 32.4 (H) 40.1 - 15.0 %    MPV 8.1 7.0 - 10.0 fL    Platelet 291 150 - 440 10*9/L   Magnesium Level    Collection Time: 05/18/18  7:06 AM   Result Value Ref Range    Magnesium 1.9 1.6 - 2.2 mg/dL   POCT Glucose    Collection Time: 05/18/18 10:44 AM   Result Value Ref Range    Glucose, POC 192 (H) 70 - 179 mg/dL       Relevant Studies/Radiology (if blank, then none):  Xr Chest Portable    Result Date: 05/16/2018  EXAM: XR CHEST PORTABLE DATE: 05/16/2018 8:27 AM ACCESSION: [id number] UN DICTATED: 05/16/2018 8:51 AM INTERPRETATION LOCATION: [facility] CLINICAL INDICATION: 48 years old Male with PNEUMONIA  COMPARISON: 05/15/2018 TECHNIQUE: Portable supine FINDINGS: The image was acquired in a better degree of inspiration. Mild cardiomegaly is present with mild pulmonary vascular congestion and probably perihilar edema. No definite pneumothorax or pleural fluid is seen. The costophrenic angles are cut off the image. No focal consolidation is identified. The rest of the study is stable.     Xr Chest Portable    Result Date: 05/14/2018  EXAM: XR CHEST PORTABLE DATE: 05/14/2018 ACCESSION: [id number] UN DICTATED: 05/14/2018 7:06 PM INTERPRETATION LOCATION: [facility] CLINICAL INDICATION: 48 years old Male with CHEST PAIN  COMPARISON: Chest radiograph dated 05/11/2018, and earlier. TECHNIQUE: Portable Chest Radiograph. FINDINGS: Left-sided cardiac pacer/AICD in place with leads terminating over the right atrium and right ventricle. Lungs are hypoinflated. Prominent interstitial lung markings with scattered areas of peribronchial cuffing, nonspecific, but can be seen in setting of mild background pulmonary edema. The cardiomediastinal silhouette is within normal limits to mildly enlarged. No pleural effusion on the right is appreciated. The costophrenic angle on the left is  not well visualized, limiting evaluation. No pneumothorax. Visualized upper abdomen is unremarkable. Remote appearing left-sided rib deformities, unchanged from prior exam.     Findings suggestive of mild background pulmonary edema.    Ct Head Wo Contrast    Result Date: 05/15/2018  EXAM: Computed tomography, head or brain without contrast material. DATE: 05/14/2018 8:21 PM ACCESSION: [id number] UN DICTATED: 05/14/2018 8:24 PM INTERPRETATION LOCATION: [facility] CLINICAL INDICATION: 48 years old Male with ams  COMPARISON: CT head without contrast 02/13/2018 TECHNIQUE: Axial CT images of the head  from skull base to vertex without contrast. FINDINGS: There is no midline shift or mass lesion.  There is no evidence of intracranial hemorrhage or acute infarct.  No fractures are evident.  The sinuses are pneumatized.     No acute intracranial abnormality.    Xr Chest 2 Views    Result Date: 05/11/2018  EXAM: XR CHEST 2 VIEWS DATE: 05/11/2018 2:13 PM ACCESSION: [id number] UN DICTATED: 05/11/2018 2:14 PM INTERPRETATION LOCATION: [facility] CLINICAL INDICATION: 48 years old Male with CHEST PAIN ; Chest Pain  COMPARISON: CXR 03/12/2018 and earlier. TECHNIQUE: PA and Lateral Chest Radiographs. FINDINGS: Unchanged positioning of left chest wall AICD/pacer leads. Mild lung hypoinflation. No focal consolidation. No pleural effusion or pneumothorax. Unremarkable cardiomediastinal silhouette. Unchanged chronic left-sided rib deformities.     No acute airspace disease.    Echocardiogram Follow Up/limited Echo With Contrast    Result Date: 05/14/2018  ?? Technically difficult study due to chest wall/lung interference ?? Limited study to evaluate ventricular function ?? Left ventricular hypertrophy - mild ?? Mildly decreased left ventricular systolic function, ejection fraction 40 to 45% ?? Segmental wall motion abnormality - (anteroseptal) ?? Ultrasound enhancing agent utilized to improve endocardial border definition      Cath/vascular Procedure    Result Date: 05/13/2018  Cardiac Catheterization Laboratory [facility], [place] Tel: [phone] Fax: [phone] CARDIAC CATHETERIZATION REPORT Date of Procedure: 05/13/18 ___________________________________________________________________________ _ Findings: 1. Coronary artery disease including diffuse moderate to severe lesions in the left coronary system, with particular emphasis on the 90% proximal to mid LAD in-stent restenosis. 2. Successful PCI to the proximal to mid LAD with the placement of a Synergy drug eluting stent with excellent angiographic result and TIMI 3 flow. 3. Subsequent impaired flow at an adjacent moderate diagonal, which was small and had pre-existing ostial disease. Recrossed and treated with balloon angioplasty with restored flow. 4. Recent stent to RPDA/RPLA bifurcation widely patent. 5. Normal left ventricular filling pressures (LVEDP = 8 mm Hg). Recommendations: 1. Aggressive secondary prevention. 2. Antiplatelet therapy with ticagrelor, will hold on use of aspirin given ongoing need for rivaroxaban, which should be continued. Complications: ?? None ___________________________________________________________________________ _ Referring Physician: Dr. Simmie Davies Primary Cardiologist: Dr. Hoy Finlay Primary Care Physician: Kurtis Bushman Performing Attending: Trude Mcburney MD Diagnostic Fellow: Nicholes Stairs MD Interventional Fellow: Berdine Dance, MD Procedures performed: Left Heart Catheterization & Coronary Angiography) Percutaneous Coronary Intervention (PCI) Access Site: Left Radial Artery Arterial Closure: TR Band Contrast Volume: 270 mL Fluoroscopy Time: 22.3 mins Radiation Dose: 2508 mGy ___________________________________________________________________________ _ History Shawn Route Mccasland is a 48 y.o. year old male with history of known coronary artery disease status post STEMI in 2018 complicated by cardiogenic shock following VT arrest with emergent PCI to his mid LAD, history of ischemic cardiomyopathy status post ICD now with recovered LVEF, CKD with baseline creatinine of 3, hypertension, diabetes, hyperlipidemia, and obesity who presents to [facility] as a transfer from [facility]  emergency department for further work-up of troponin elevation and recent chest pain.  Patient had chest discomfort 3 days ago where he visited the [facility] emergency department for sharp left-sided chest and shoulder discomfort.  His troponin was very modestly elevated and he left AMA from that hospital.  He was then referred to the emergency department at [facility] through his PCPs office for further management.  In the ED, he was given aspirin, fentanyl, nitroglycerin with improvement in his chest discomfort.  His troponin peaked at just over 1 and is now downtrending.  Pertinent Cardiac Studies LHC 03/12/18: 1. Coronary artery disease including??severe mid RCA, and distal RCA bifurcation stenosis. 2. Left heart cath deferred 3. Successful??PCI??to the mid RCA and RPLA/RPDA bifurcation??with the placement of a Synergy DES x??with excellent angiographic result and TIMI 3 flow. ?? LHC @ [facility] 02/2018: 1. Significant three-vessel coronary artery disease including multifocal in-stent restenosis of up to 50-60% involving the mid LAD, 80% ostial stenosis of large D2 branch, which is jailed by LAD stent, 70% proximal LCx stenosis involving OM1, 60% mid RCA stenosis, and complex distal RCA bifurcation lesion of up to 90% stenosis ([place] 1,1,1). 2. Mildly to moderately elevated left ventricular filling pressure (LVEDP 20 to 25 mmHg). Procedure Left Heart Catheterization (Left Radial) Under Lidocaine 2% local anesthesia, a 39F Terumo Glidesheath was placed in the right radial artery using modified Seldinger technique. 200 mcg of nicardipine was given via the sheath intra-arterially. A 0.035 Rosen wire (260cm) was used to guide the advancement of the coronary catheter for engagement. 4000 units of heparin were given after the wire and catheter crossed the aortic arch into the ascending aorta. Left heart catheterization was performed by advancing a 39F JR4 catheter across the aortic valve. The LVEDP was obtained. Selective coronary angiography was then performed in multiple views by hand injections of Omnipaque using a 39F JR4 catheter to engage the right coronary artery and a 39F JL3.5 catheter to engage the left coronary artery. Percutaneous Coronary Intervention The decision was made to intervene on the proximal to mid LAD. PCI to proximal to mid LAD ?? Anticoagulation: Heparin ?? Antiplatelet: ticagrelor ?? Guide: 39F EBU 3.5 ?? Guidewire(s): 0.014 minamo ?? Intracoronary imaging: n/a  ?? Pre-dilation balloon(s): n/a ?? Stent(s): Synergy 3.0 x 20 mm DES ?? Post-dilation balloon(s): Stent balloon @ 20 atm ?? Result: Excellent angiographic result with TIMI 3 flow.  PCI to D2 ?? Anticoagulation: Heparin ?? Antiplatelet: ticagrelor ?? Guide: 39F EBU 3.5 ?? Guidewire(s): 0.014 Minamo ?? Intracoronary imaging: n/a  ?? Balloon Angioplasty: 2.0 x 15 mm Trek @ 16 atm ?? Result: Excellent angiographic result with TIMI 3 flow.  Following the procedure, the sheath was removed, and a TR band was applied to achieve hemostasis. The TR band was subsequently removed with staged removal of air as per protocol. The patient tolerated the procedure well without any complications. ___________________________________________________________________________ _ FINDINGS Hemodynamics and Left Heart Catheterization ?? Aortic pressure: 88/57 mm Hg (mean 71 mm Hg) ?? Left ventricular filling pressure: 104/0 (LVEDP = 8 mm Hg). Coronary Angiography Dominance: Right Left Main: The left main coronary artery (LMCA) is a large-caliber vessel that originates from the left coronary sinus. It bifurcates into the left anterior descending (LAD) and left circumflex (LCx) arteries. There is mild up to 20% disease in the LMCA. LAD: The LAD is a large-caliber vessel that gives off 2 diagonal (D) branches before it wraps around the apex. D1 is a medium-caliber vessel. D2 is a medium-caliber branching vessel.  There is moderate to severe diffuse disease throughout the LAD system, including up to 90% in-stent restenosis of the previous LAD stent. There is a 90% stenosis in the ostium of D2 which takes off midway through the LAD stent. Left Circumflex:  The LCx is a medium-caliber vessel that gives off two obtuse marginal (OM) branches and then continues as a small vessel in the AV groove. OM1 is a medium-caliber branching vessel. OM2 is a medium-caliber vessel. There is no angiographic evidence of significant disease in the LCx. There is diffuse moderate disease in the LCx system and a 75% focal proximal stenosis around the D1 takeoff. Right Coronary: The right coronary artery (RCA) is a large-caliber vessel originating from the right coronary sinus. It bifurcates distally into the posterior descending artery (PDA) and a posterolateral (PL) branch consistent with a right dominant system. There is mild disease up to 20% throughout the RCA system, and the recently placed RCA stents are widely patent. PCI Indication Presentation:  (STEMI, NSTEMI, Unstable angina, Stable angina):  NSTEMI Angina class:  (Class I-IV):  IV Assessment for dual-antiplatelet therapy:  No contraindications Pre-procedure non-invasive study:  (low, intermediate, high risk): n/a FFR (if performed): n/a Complications:  None Blood loss:  Minimal Specimen:  None Device/Implants:  Synergy 3.0 x 20 mm DES Pre-procedure Dx:  NSTEMI Post-procedure Dx:  In-stent restenosis of LAD stent I personally spent 73 minutes continuously monitoring the patient during the administration of moderate sedation.  Pre and post sedation activities have been reviewed. ___________________________________________________________________________ Nicholes Stairs Cardiology Fellow, PGY5 Berdine Dance, MD Interventional Cardiology Fellow As PCI was performed / angina was present, cardiac rehab referral ordered and was discussed with patient ___________________________________________________________________________ _ I was present for the entire duration of the procedure, as detailed above Trude Mcburney MD     ______________________________________________________________________  Discharge Instructions:   Activity Instructions     Activity as tolerated            Diet Instructions     Discharge diet (specify)      Discharge Nutrition Therapy:  Heart Healthy          Other Instructions     Call MD for:      Chest pain or shortness of breath         Call MD for:  difficulty breathing, headache or visual disturbances      Call MD for:  persistent nausea or vomiting      Discharge instructions      You were admitted for chest pain related to a heart attack caused by a blocked artery to your heart. While hospitalized, you underwent a procedure to open that artery with a stent. As a result of this procedure, your kidneys were injured from all of the contrast dye, however, they are improving at this point. Please make sure that you continue to take all of your medications as directed and that you follow up with Dr. Andrey Farmer and Dr. Hulan Fess.     Please return to the hospital immediately if you continue to experience similar chest pain, worsening shortness of breath, abdominal pain, or any significant decrease in urine output.                    Follow Up instructions and Outpatient Referrals     Call MD for:      Call MD for:  difficulty breathing, headache or visual disturbances      Call MD for:  persistent nausea or  vomiting      Discharge instructions            Appointments which have been scheduled for you    May 20, 2018  1:30 PM EST  (Arrive by 1:15 PM)  Danelle Earthly RETURN with Marlaine Hind, MD  [facility]ARD[facility] [address] [place] [facility] [place]) [address]  [address]  [place] [address]  [phone]      May 21, 2018  9:15 AM EST  (Arrive by 9:00 AM)  RETURN ANTICOAG with Leda Min, PA  [facility] INTERNAL MEDICINE [place] [facility] REGION) [address]  [place] [address]  [phone]      May 21, 2018  9:40 AM EST  (Arrive by 9:25 AM)  RETURN  ATTENDING with Roma Schanz, MD  [facility] INTERNAL MEDICINE [facility] REGION) [address] [place] [address]  [phone]      May 21, 2018 10:00 AM EST  (Arrive by 9:45 AM)  RETURN COUNSELING with Ebbie Latus, LCSW  [facility] INTERNAL MEDICINE [facility] REGION) [address]  [place] [address]  [phone]      Jun 22, 2018 10:00 AM EDT  RETURN  RETINA with Melvyn Novas, MD  [facility] OPHTHALMOLOGY [address] [place] [facility] REGION) [address]  [address]  [place] [address]  [phone]      Jul 20, 2018  9:30 AM EDT  (Arrive by 9:15 AM)  RETURN  GENERAL with Mila Merry, MD  [place] NEPHROLOGY [place] [facility] REGION) [address]  [place] [address]  [phone]           ______________________________________________________________________  Discharge Day Services:  BP 136/69  - Pulse 78  - Temp 36.8 ??C (Oral)  - Resp 20  - Ht 188 cm (6' 2)  - Wt (!) 160.3 kg (353 lb 6.4 oz)  - SpO2 94%  - BMI 45.37 kg/m??   Pt seen on the day of discharge and determined appropriate for discharge.    Condition at Discharge: good    Length of Discharge: I spent greater than 30 mins in the discharge of this patient.

## 2018-05-18 NOTE — Unmapped (Signed)
Pharmacist Discharge Note  Patient Name: Jeremy Oliver  Reason for admission: NSTEMI  Reason for writing this note: patient requires medication-related outpatient intervention and/or monitoring    Please consider the following items at follow-up:    1. Antithrombotic Therapy: [name] will continue taking ticagrelor 90 mg BID given multiple PCI and he will continue taking rivaroxaban 15 mg every evening with food given atrial fibrillation. Once his renal function returns to normal, would recommend increasing rivaroxaban dose to rivaroxaban 20 mg every evening with food as appropriate (i.e.,  when CrCl >50 mL/min using actual body weight and full Cockcroft-Gault equation, per ROCKET-AF trial). It is anticipated he will have CrCl >50 mL/min when serum creatinine is <4 mg/dL. His serum creatinine at time of discharge is 4.78 mg/dL and his baseline serum creatinine appears to be around 3-3.5 mg/dL.      The above plan is current as of writing; if additional changes are made, please consult formal discharge summary.     Thank you for your attention to these items; please do not hesitate to contact me with any further questions.    Donnamae Jude,  PharmD, BCPS  Cardiology Clinical Pharmacist  Pager [phone]    Future Appointments   Date Time Provider Department Center   05/20/2018  1:30 PM Marlaine Hind, MD [facility]   05/21/2018  9:15 AM Catalina Gravel Quincy Simmonds, PA [facility]   05/21/2018  9:40 AM Roma Schanz, MD [facility]   05/21/2018 10:00 AM Izora Gala Dolan-Soto, LCSW [facility]   06/22/2018 10:00 AM Melvyn Novas, MD [facility]   07/20/2018  9:30 AM Mila Merry, MD [facility]

## 2018-05-19 LAB — ALDOSTERONE: Aldosterone:MCnc:Pt:Ser/Plas:Qn:: <4

## 2018-05-19 NOTE — Unmapped (Signed)
CCM Follow-up Call      PCP Action:sign pended omeprazole rx     CCM Action:None    Patient Action:come to appt Friday! call CCM or [facility]  for any questions      Follow up/Discuss next call: HF,, SDOH, DM, medications , anxiety, emesis        CCM Return call    [name]/wife left vm pt keeps coughing then throwing up.  CCM returns call, pt answers call    Pt states has had cough for over a month now.  Pt states, in the hospital it wasn't that bad, I caught a virus or something in the hospital.  Afebrile.   Throwing up stomach acid it's clear/no bile/no blood/ no nausea- has emesis only after coughing episode. Pt is keeping some food down and hydrated.   Pt states does have some acid reflux , currently not taking acid reflux medication, tries to watch diet, less fat and less spicy- when gets too spicy the reflux is terrible. Pt states did take prilosec rx in the past.  Pt in good spirits during phone call and will come to appt Friday. Denies need to bring in medications to 2/21 appt. - states, [name] and I have gone over them 3 times and we have all the medications  Pt confirms taking all prescribed medications, Denies need for refills. Denies need to be seen prior to Friday.    Pt States to tell Dr. Hulan Fess , you got your wish, I stayed in the hospital for you for an extra day.  CCM discussed w/ pt re: self care and pt looks forward to follow up with Diane SW.    Pt resting today, states his instructions:  I can't lift anything heavier than a jug of milk and everything I have for this move is heavier than a jug of milk.    Pt states no medication changes were made while inpatient.  Pt states Medicaid has found a home , they will move in 5 days. It is a one story and [name] is not allowing pt to assist with the move. Pt bringing his dog, [name], with them.

## 2018-05-19 NOTE — Unmapped (Signed)
I was the supervising physician in the delivery of the service. Wanisha Shiroma D Trana Ressler, MD

## 2018-05-19 NOTE — Unmapped (Signed)
Looks like the diagnoses still need to be selected.  Please complete this.   Additionally, I am not signing the omeprazole orders.  Can't say that his vomiting is because of reflux and needs to be evaluated. He should come to [facility] sooner.

## 2018-05-19 NOTE — Unmapped (Signed)
Reason for Disposition  ??? MILD asthma attack (e.g., no SOB at rest, mild SOB with walking, speaks normally in sentences, mild wheezing)    Answer Assessment - Initial Assessment Questions  Recent:   What is the date of your last related visit?  N/A  Related acute medications Rx'd:  Albuterol MDI, 2 puffs 0945, I have almost gone through a bag of Halls cough drops.  Home treatment tried:  Got in the shower and that helped ease my cough. My cough is better when I am outside in the cool air.    Relevant:   Allergies: N/A  Medications: Albuterol MDI 0945-1000 a.m.  Health History: 2/18 hospital discharge following MI and cardiac stent placement.  Weight: N/A               1. RESPIRATORY STATUS: Describe your breathing? (e.g., wheezing, shortness of breath, unable to speak, severe coughing)       Sometimes fine, but other times get to coughing and can't stop. When I spoke with Mr. Dreisbach initially he was having difficulty speaking in complete sentences. He is now able to speak at length.   2. ONSET: When did this asthma attack begin?       About a week got worse when I was in the hospital, the room got too cold. After that my cough seemed worse.  3. TRIGGER: What do you think triggered this attack? (e.g., URI, exposure to pollen or other allergen, tobacco smoke)     Cough, my wife is now sick with a cough  4. PEAK EXPIRATORY FLOW RATE (PEFR): Do you use a peak flow meter? If so, ask: What's the current peak flow? What's your personal best peak flow?       No  5. SEVERITY: How bad is this attack?     - MILD: No SOB at rest, mild SOB with walking, speaks normally in sentences, can lay down, no retractions, pulse < 100. (GREEN Zone: PEFR 80-100%)    - MODERATE: SOB at rest, SOB with minimal exertion and prefers to sit, cannot lie down flat, speaks in phrases, mild retractions, audible wheezing, pulse 100-120. (YELLOW Zone: PEFR 50-80%)     - SEVERE: Very SOB at rest, speaks in single words, struggling to breathe, sitting hunched forward, retractions, usually loud wheezing, sometimes minimal wheezing because of decreased air movement, pulse > 120. (RED Zone: PEFR < 50%).       n/a  6. MEDICATIONS (Inhaler or nebs): What are your asthma medications? and What treatments have you given so far?     - Quick-relief: albuterol, metaproterenol, salbutamol, or other inhaled or nebulized beta-agonist medicines    - Long-term-control: steroids, cromolyn, or other anti-inflammatory medicines.      Albuterol MDI only  7. OTHER SYMPTOMS: Do you have any other symptoms? (e.g., runny nose, chest pain, fever)     I coughed so hard that I have vomited twice since returning home. He denies any chest pain, SOB at rest or with exertion. His coughing spasms has stopped. He said this is not unusual and he has experienced this problem for the past month. He denies chest pain or other symptoms. He feels he is about the same as when he left the hospital. I feel that my cough is worse in my house, don't know if it is dust.  8. PREGNANCY: Is there any chance you are pregnant? When was your last menstrual period?      N/a male    Protocols  used: ASTHMA ATTACK-ADULT-OH

## 2018-05-20 ENCOUNTER — Ambulatory Visit
Admit: 2018-05-20 | Discharge: 2018-05-21 | Payer: MEDICARE | Attending: Cardiovascular Disease | Primary: Cardiovascular Disease

## 2018-05-20 DIAGNOSIS — I2510 Atherosclerotic heart disease of native coronary artery without angina pectoris: Principal | ICD-10-CM

## 2018-05-20 DIAGNOSIS — I5042 Chronic combined systolic (congestive) and diastolic (congestive) heart failure: Secondary | ICD-10-CM

## 2018-05-20 DIAGNOSIS — I10 Essential (primary) hypertension: Secondary | ICD-10-CM

## 2018-05-20 NOTE — Unmapped (Signed)
Keep up the great work  Return to see me in 4 weeks for repeat labs     Elpidio Anis MD [facility] FSCAI  [facility]   Division of Cardiology  [phone] pgr    Dictated using Animal nutritionist, please excuse typos

## 2018-05-20 NOTE — Unmapped (Signed)
PCP:  Jeremy Bushman, MD    Patient ID: Jeremy Oliver is a 48 y.o. male.    Assessment and Plan:   1. Coronary artery disease involving native coronary artery of native heart without angina pectoris. Most recently s/p DES-RCA for NSTEMI followed by repeat LAD PCI 05/2018. Initially presented VT arrest 2/2 STEMI LAD s/p DES 02/2017  -- Ticagrelor 90mg  BID, for at least one year but likely longer   -- No ASA given concurrent use of Rivaroxiban   - Continue atorvastatin 80mg  daily and Zetia 10mg  daily   - Started Repatha    2. Chronic heart failure with reduced ejection fraction and diastolic dysfunction (CMS-[name]). NYHA class II. Stage C.   - s/p AICD   - Continue Coreg 25mg  BID, hydralazine  - Continue current dose of bumex    - Holding ACEi and MRA given renal function  - No indication for CRT at present but he Boomer be heading there in the future     3. Paroxysmal atrial fibrillation (CMS-[name])  -- Rivaroxiban 15mg  daily given renal function, eGFR-19    4. Acute deep vein thrombosis (DVT) of proximal vein of both lower extremities (CMS-[name])  -- DOAC as above    Return in about 4 weeks (around 06/17/2018).    Jeremy Anis MD Jeremy Oliver FSCAI  [facility]   Division of Cardiology  [phone] pgr    Dictated using Dragon software, please excuse typos    Subjective:      Jeremy Oliver returns for follow-up.   He had a long and complex hospital course recently after presenting with cardiogenic shock and NSTEMI.   He required Impella placement after V. tach cardiac arrest, and had PCI performed of the mid LAD and diagonal vessel.  His cardiogenic shock improved but he required ECMO cannulation for ongoing hemodynamic support.  His ECMO cannulation was performed in the operating room and complicated by femoral artery injury requiring vascular surgery assistance.  He was on dialysis for several weeks, but his kidney function has now improved and he no longer requires hemodialysis. Interval hx:  Recent hospitalization 12/12-12/15/2019 for NSTEMI s/p DES to dRCA. Since discharge patient report 2 separate episodes of atypical chest pain. No pain with current level of activity. No worsening dyspnea. Patient reports tolerance of current medical regimen, and reports compliance with medical regimen. The patient denies any recent  orthopnea, PND, leg swelling, changes in weight , changes in appetite, palpitations and dizzy spells.    He was admitted again for NSTEMI last week and had DES to LAD.  He had post-operative contrast nephropathy but did not require dialysis.  He is currently being evicted from his house, but appears to be stable otherwise.  He is ambulating and denies any worsening PND/orthopnea or edema.       Date of Procedure: March 17, 2017   Findings:  1. Significant 2-vessel??coronary artery disease. ??There is a 90% stenosis of the proximal LAD with TIMI 1 flow into the distal vessel. There is a 70% stenosis of the ostial DIAG2 vessel which was treated with balloon angiogplasty.   There is a 70% stenosis of the proximal LCx and there is mild-moderate RCA disease.   2. Decreased??LV Function by previous echo ??EF estimated 30 %. ??LVEDP 35 mm Hg  3. PCI??was performed of the??proximal and mid 90%??LAD??lesion??using Resolute Onyx (drug eluting stent)??x 2. Good??angiographic result with TIMI??3??flow  4. Impella was placed  for cardiogenic shock prior to PCI  5. Right heart cath after Impella placement- RA mean 11, RV 40/8, ??PA 41/21 mean 29, ??PCWP mean 12  6. PA sat 58, Fick CO/CI 4.6/1.7   Recommendations:  PCI performed (see details below)  ?? Aggressive secondary prevention  ?? ASA 81 mg daily indefinitely  ?? Prasugrel 10 mg daily??for at least 12 months, ideally longer.  ?? CYP2C19 genotyping  ?? Transfer to CICU for ongoing management of cardiogenic shock  ??    Patient Active Problem List   Diagnosis   ??? CAD (coronary artery disease) (RAF-[name])   ??? Idiopathic chronic gout of left knee   ??? Hypercholesteremia   ??? Essential hypertension (RAF-[name])   ??? 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 Osteoarthritis   ??? Chronic nonalcoholic liver disease   ??? GERD (gastroesophageal reflux disease)   ??? Mild intermittent asthma without complication   ??? Chronic pain syndrome   ??? Paroxysmal atrial fibrillation (CMS-[name])   ??? OSA (obstructive sleep apnea)   ??? Syncope   ??? Tobacco use disorder   ??? Status post cardiac pacemaker procedure   ??? Type 2 diabetes mellitus with stage 4 chronic kidney disease, with long-term current use of insulin (CMS-[name])   ??? Cardiac pacemaker in situ   ??? Recurrent major depressive disorder, in full remission (CMS-[name])   ??? Adjustment disorder, unspecified   ??? Chronic heart failure with reduced ejection fraction and diastolic dysfunction (CMS-[name])   ??? Hyperkalemia   ??? CKD (chronic kidney disease) stage 4, GFR 15-29 ml/min (CMS-[name])   ??? Acquired left foot drop   ??? Decreased sensation of lower extremity, left   ??? Chronic anticoagulation       Current Outpatient Medications:   ???  acetaminophen (TYLENOL) 500 MG tablet, TAKE 2 TABLETS (1 000MG ) BY MOUTH EVERY 8 HOURS AS NEEDED FOR PAIN, Disp: 180 each, Rfl: 3  ???  albuterol HFA 90 mcg/actuation inhaler, Inhale 2 puffs into the lungs every 6 (six) hours as needed for wheezing or shortness of breath., Disp: 18 g, Rfl: 2  ???  alirocumab (PRALUENT PEN) 75 mg/mL PnIj, INJECT THE CONTENTS OF 1 PEN (75 MG) UNDER THE SKIN EVERY 14 DAYS, Disp: 2 mL, Rfl: 11  ???  allopurinol (ZYLOPRIM) 100 MG tablet, Take 0.5 tablets (50 mg total) by mouth every other day., Disp: 30 tablet, Rfl: 11  ???  amiodarone (PACERONE) 200 MG tablet, TAKE 1 TABLET BY MOUTH ONCE DAILY, Disp: 90 tablet, Rfl: 3  ???  atorvastatin (LIPITOR) 80 MG tablet, Take 1 tablet (80 mg total) by mouth daily., Disp: 90 tablet, Rfl: 3  ???  bumetanide (BUMEX) 2 MG tablet, TAKE 1 TABLET BY MOUTH ONCE DAILY, Disp: 90 tablet, Rfl: 3  ??? carvedilol (COREG) 25 MG tablet, TAKE 1 TABLET BY MOUTH WITH THE MORNING MEAL AND TAKE 2 TABLETS WITH THE EVENING MEAL, Disp: 270 tablet, Rfl: 3  ???  diclofenac sodium (VOLTAREN) 1 % gel, Apply 2 g topically Three (3) times a day as needed for pain., Disp: , Rfl:   ???  ezetimibe (ZETIA) 10 mg tablet, TAKE 1 TABLET BY MOUTH ONCE DAILY, Disp: 90 each, Rfl: 3  ???  FLUoxetine (PROZAC) 20 MG capsule, Take 60 mg by mouth nightly., Disp: , Rfl:   ???  gabapentin (NEURONTIN) 300 MG capsule, Take 2 capsules (600 mg total) by mouth nightly., Disp: 180 capsule, Rfl: 3  ???  hydrALAZINE (APRESOLINE) 25 MG tablet, Take 1 tablet (25 mg total) by mouth Three (3) times a day., Disp: 270 tablet, Rfl: 3  ???  insulin ASPART (NOVOLOG FLEXPEN U-100 INSULIN) 100 unit/mL (3 mL) injection pen, Inject 0.15 mL (15 Units total) under the skin Three (3) times a day with a meal., Disp: 15 mL, Rfl: 11  ???  insulin detemir U-100 (LEVEMIR) 100 unit/mL (3 mL) injection pen, Inject 40 Units under the skin nightly., Disp: , Rfl:   ???  isosorbide mononitrate (IMDUR) 30 MG 24 hr tablet, Take 1 tablet (30 mg total) by mouth daily., Disp: 90 tablet, Rfl: 3  ???  losartan (COZAAR) 50 MG tablet, Take 1 tablet (50 mg total) by mouth daily., Disp: 90 tablet, Rfl: 3  ???  melatonin 3 mg cap, Take 9 mg by mouth nightly., Disp: , Rfl:   ???  nitroglycerin (NITROSTAT) 0.4 MG SL tablet, Place 1 tablet (0.4 mg total) under the tongue every five (5) minutes as needed for chest pain., Disp: 100 each, Rfl: 3  ???  QUEtiapine (SEROQUEL) 50 MG tablet, Take 1 tablet (50 mg total) by mouth two (2) times a day as needed (headache)., Disp: 180 tablet, Rfl: 3  ???  rivaroxaban (XARELTO) 15 mg Tab, Take 1 tablet (15 mg total) by mouth daily with evening meal., Disp: 90 tablet, Rfl: 3  ???  semaglutide 0.25 mg or 0.5 mg(2 mg/1.5 mL) PnIj, Inject 0.25 mg under the skin every seven (7) days. For 4 weeks then increase to 0.5mg  every 7 days, Disp: 7.5 mL, Rfl: 0  ???  sevelamer (RENVELA) 800 mg tablet, TAKE 2 TABLETS (1600MG ) BY MOUTH THREE TIMES DAILY WITH MEALS, Disp: 540 tablet, Rfl: 3  ???  ticagrelor (BRILINTA) 90 mg Tab, TAKE 1 TABLET BY MOUTH TWICE DAILY, Disp: 180 tablet, Rfl: 3  ???  traMADol (ULTRAM) 50 mg tablet, Take 1 tablet (50 mg total) by mouth every six (6) hours as needed., Disp: 30 tablet, Rfl: 0  ???  amLODIPine (NORVASC) 10 MG tablet, Take 0.5 tablets (5 mg total) by mouth daily., Disp: 90 tablet, Rfl: PRN  ???  blood-glucose meter kit, Use to check blood sugars, #1 meter, #100 strips. Freestyle glucose meter. ICD-9 250.00, Disp: 1 each, Rfl: 0  ???  blood-glucose sensor (FREESTYLE NAVIGATOR GLUC SENS) Devi, Frequency:BID   Dosage:0.0     Instructions:  Note:Dose: 1, Disp: , Rfl:   ???  cholecalciferol, vitamin D3, 5,000 unit capsule, Take 1 capsule (5,000 Units total) by mouth daily., Disp: 100 capsule, Rfl: 0  ???  empty container Misc, Use as directed to dispose of injectable medications, Disp: 1 each, Rfl: 3  ???  insulin syringe-needle U-100 0.3 mL 31 gauge x 5/16 Syrg, Use to inject insulin twice daily with meals, Disp: 60 each, Rfl: 0  ???  lancets 30 gauge Misc, USE TO CHECK BLOOD SUGAR THREE TIMES DAILY WITH MEALS, Disp: 250 each, Rfl: 2  ???  UNIFINE PENTIPS 31 gauge x 5/16 Ndle, USE WITH INSULIN AND VICTOZA 5 TIMES DAILY, Disp: 400 each, Rfl: 3    Allergies   Allergen Reactions   ??? Morphine Palpitations     Medication error: was administered 20mL instead of 2mL        Objective:      Vitals:    05/20/18 1346   BP: 140/82   Pulse: 104   SpO2: 96%   Body mass index is 43.32 kg/m??.    Wt Readings from Last 6  Encounters:   05/22/18 (!) 133.5 kg (294 lb 5 oz)   05/20/18 (!) 153 kg (337 lb 6.4 oz)   05/17/18 (!) 160.3 kg (353 lb 6.4 oz)   05/11/18 (!) 158.9 kg (350 lb 6.4 oz)   04/23/18 (!) 161.7 kg (356 lb 6.4 oz)   04/02/18 (!) 153.9 kg (339 lb 4.8 oz)        Physical Exam  Gen: male seated comfortably in the exam room.  HEENT: OP clear with MMM.  Neck: Supple without LAD, TM, JVD, or HJR.  Resp: CTA bilaterally with normal WOB.  CV: RRR without m/r/g.  Abd: Soft, NT/ND without HSM.  Ext: 2+ radial, PT, and DP pulses bilaterally.  No LE edema.  NEURO:  No gross abnormalities    Lab Review   Lab Results   Component Value Date    Sodium 139 05/23/2018    Sodium Whole Blood 132 (L) 03/25/2017    Potassium 4.8 05/23/2018    Potassium, Bld 5.5 (H) 03/25/2017    Chloride 111 (H) 05/23/2018    Chloride 105 06/09/2014    CO2 21.0 (L) 05/23/2018    CO2 23 06/09/2014    BUN 25 (H) 05/23/2018    BUN 24 (H) 06/09/2014    Creatinine 3.42 (H) 05/23/2018    Creatinine 1.30 06/09/2014    Glucose 134 05/23/2018    Calcium 8.5 05/23/2018    Calcium 8.3 (L) 06/09/2014        The ASCVD Risk score Denman George DC Jr., et al., 2013) failed to calculate for the following reasons:    The patient has a prior MI or stroke diagnosis       Please excuse typos, dictation completed via Dragon voice recognition software      _____________________________________________________________    Jeremy Anis  MD [facility]  Division of Cardiology   Jeremy of [facility] office [phone]  Pager [phone]

## 2018-05-20 NOTE — Unmapped (Signed)
Praluent 75 mcg started 2/8, second injection to be done on 2/22.  Patient denies issues with administration, no injection site reactions.  He has continued taking atorvastatin 80 mg and ezetimibe 10 mg daily.  Approved for [employer] and therefore $0 co-pay trough the end of the year, also has been approved for [employer] - [place] need new PA. (message sent to [facility])  Will follow up after 3rd dose to check Lipids and adjust dose if necessary.      Armando Gang, PharmD, BCACP, CPP  Heart and Vascular Clinical Pharmacist Practitioner  [address] [facility] [address]

## 2018-05-21 NOTE — Unmapped (Signed)
Called and left voicemail to either confirm or reschedule appointments 05/21/18

## 2018-05-22 ENCOUNTER — Emergency Department: Payer: Medicare Other

## 2018-05-22 ENCOUNTER — Emergency Department
Admission: EM | Admit: 2018-05-22 | Discharge: 2018-05-22 | Disposition: A | Discharge status: Other Acute Inpatient Hospital | Payer: Medicare Other | Attending: Student in an Organized Health Care Education/Training Program | Admitting: Student in an Organized Health Care Education/Training Program

## 2018-05-22 ENCOUNTER — Other Ambulatory Visit: Payer: Self-pay

## 2018-05-22 ENCOUNTER — Encounter: Payer: Self-pay | Admitting: Emergency Medicine

## 2018-05-22 DIAGNOSIS — R55 Syncope and collapse: Principal | ICD-10-CM

## 2018-05-22 DIAGNOSIS — Z7901 Long term (current) use of anticoagulants: Secondary | ICD-10-CM | POA: Diagnosis not present

## 2018-05-22 DIAGNOSIS — Z950 Presence of cardiac pacemaker: Secondary | ICD-10-CM | POA: Insufficient documentation

## 2018-05-22 DIAGNOSIS — E119 Type 2 diabetes mellitus without complications: Secondary | ICD-10-CM | POA: Insufficient documentation

## 2018-05-22 DIAGNOSIS — I252 Old myocardial infarction: Secondary | ICD-10-CM | POA: Insufficient documentation

## 2018-05-22 DIAGNOSIS — I110 Hypertensive heart disease with heart failure: Secondary | ICD-10-CM | POA: Insufficient documentation

## 2018-05-22 DIAGNOSIS — I2510 Atherosclerotic heart disease of native coronary artery without angina pectoris: Secondary | ICD-10-CM | POA: Insufficient documentation

## 2018-05-22 DIAGNOSIS — Z87891 Personal history of nicotine dependence: Secondary | ICD-10-CM | POA: Insufficient documentation

## 2018-05-22 DIAGNOSIS — Z79899 Other long term (current) drug therapy: Secondary | ICD-10-CM | POA: Diagnosis not present

## 2018-05-22 DIAGNOSIS — I959 Hypotension, unspecified: Secondary | ICD-10-CM | POA: Insufficient documentation

## 2018-05-22 DIAGNOSIS — I5022 Chronic systolic (congestive) heart failure: Secondary | ICD-10-CM | POA: Diagnosis not present

## 2018-05-22 DIAGNOSIS — Z794 Long term (current) use of insulin: Secondary | ICD-10-CM | POA: Insufficient documentation

## 2018-05-22 LAB — CBC W/ AUTO DIFF
BASOPHILS RELATIVE PERCENT: 0.7 %
EOSINOPHILS ABSOLUTE COUNT: 0.4 10*9/L (ref 0.0–0.4)
EOSINOPHILS RELATIVE PERCENT: 5.7 %
HEMATOCRIT: 32.1 % — ABNORMAL LOW (ref 41.0–53.0)
HEMOGLOBIN: 10.2 g/dL — ABNORMAL LOW (ref 13.5–17.5)
LARGE UNSTAINED CELLS: 2 % (ref 0–4)
LYMPHOCYTES ABSOLUTE COUNT: 2.5 10*9/L (ref 1.5–5.0)
LYMPHOCYTES RELATIVE PERCENT: 32.8 %
MEAN CORPUSCULAR HEMOGLOBIN: 27.3 pg (ref 26.0–34.0)
MEAN CORPUSCULAR VOLUME: 86.1 fL (ref 80.0–100.0)
MEAN PLATELET VOLUME: 7.9 fL (ref 7.0–10.0)
MONOCYTES ABSOLUTE COUNT: 0.4 10*9/L (ref 0.2–0.8)
MONOCYTES RELATIVE PERCENT: 5.8 %
NEUTROPHILS ABSOLUTE COUNT: 4 10*9/L (ref 2.0–7.5)
NEUTROPHILS RELATIVE PERCENT: 52.6 %
PLATELET COUNT: 347 10*9/L (ref 150–440)
RED BLOOD CELL COUNT: 3.72 10*12/L — ABNORMAL LOW (ref 4.50–5.90)
RED CELL DISTRIBUTION WIDTH: 15.5 % — ABNORMAL HIGH (ref 12.0–15.0)
WBC ADJUSTED: 7.6 10*9/L (ref 4.5–11.0)

## 2018-05-22 LAB — TROPONIN I
Troponin I.cardiac:MCnc:Pt:Ser/Plas:Qn:: 0.045
Troponin I: 0.04 ng/mL (ref <0.03)

## 2018-05-22 LAB — COMPREHENSIVE METABOLIC PANEL
ALBUMIN: 2.5 g/dL — AB (ref 3.5–5.0)
ALBUMIN: 2.9 g/dL — ABNORMAL LOW (ref 3.5–5.0)
ALKALINE PHOSPHATASE: 82 U/L (ref 38–126)
ALT (SGPT): 14 U/L (ref <50)
ALT: 14 U/L (ref 0–44)
AST (SGOT): 22 U/L (ref 19–55)
AST: 17 U/L (ref 15–41)
Alkaline Phosphatase: 61 U/L (ref 38–126)
Anion gap: 6 (ref 5–15)
BILIRUBIN TOTAL: 0.3 mg/dL (ref 0.0–1.2)
BLOOD UREA NITROGEN: 26 mg/dL — ABNORMAL HIGH (ref 7–21)
BUN / CREAT RATIO: 8
BUN: 24 mg/dL — AB (ref 6–20)
CALCIUM: 8.5 mg/dL (ref 8.5–10.2)
CHLORIDE: 112 mmol/L — ABNORMAL HIGH (ref 98–107)
CO2: 23 mmol/L (ref 22–32)
CO2: 19 mmol/L — ABNORMAL LOW (ref 22.0–30.0)
CREATININE: 3.35 mg/dL — ABNORMAL HIGH (ref 0.70–1.30)
Calcium: 8.1 mg/dL — ABNORMAL LOW (ref 8.9–10.3)
Chloride: 110 mmol/L (ref 98–111)
Creatinine, Ser: 3.33 mg/dL — ABNORMAL HIGH (ref 0.61–1.24)
EGFR CKD-EPI AA MALE: 24 mL/min/{1.73_m2} — ABNORMAL LOW (ref >=60)
EGFR CKD-EPI NON-AA MALE: 20 mL/min/{1.73_m2} — ABNORMAL LOW (ref >=60)
GFR calc Af Amer: 24 mL/min — ABNORMAL LOW (ref >60)
GFR calc non Af Amer: 21 mL/min — ABNORMAL LOW (ref >60)
GLUCOSE RANDOM: 187 mg/dL — ABNORMAL HIGH (ref 70–179)
Glucose, Bld: 138 mg/dL — ABNORMAL HIGH (ref 70–99)
POTASSIUM: 4.7 mmol/L (ref 3.5–5.0)
PROTEIN TOTAL: 5.9 g/dL — ABNORMAL LOW (ref 6.5–8.3)
Potassium: 4.4 mmol/L (ref 3.5–5.1)
SODIUM: 140 mmol/L (ref 135–145)
Sodium: 139 mmol/L (ref 135–145)
Total Bilirubin: 0.3 mg/dL (ref 0.3–1.2)
Total Protein: 6.6 g/dL (ref 6.5–8.1)

## 2018-05-22 LAB — PLATELET COUNT: Lab: 347

## 2018-05-22 LAB — SMEAR REVIEW

## 2018-05-22 LAB — MAGNESIUM: Magnesium:MCnc:Pt:Ser/Plas:Qn:: 1.7

## 2018-05-22 LAB — BILIRUBIN TOTAL: Bilirubin:MCnc:Pt:Ser/Plas:Qn:: 0.3

## 2018-05-22 LAB — URINALYSIS, COMPLETE (UACMP) WITH MICROSCOPIC
Bacteria, UA: NONE SEEN
Bilirubin Urine: NEGATIVE
Glucose, UA: >=500 mg/dL — AB
Ketones, ur: NEGATIVE mg/dL
Leukocytes,Ua: NEGATIVE
Nitrite: NEGATIVE
Specific Gravity, Urine: 1.011 (ref 1.005–1.030)
pH: 6 (ref 5.0–8.0)

## 2018-05-22 LAB — CBC WITH DIFFERENTIAL/PLATELET
Abs Immature Granulocytes: 0.04 10*3/uL (ref 0.00–0.07)
Basophils Absolute: 0 10*3/uL (ref 0.0–0.1)
Basophils Relative: 0 %
Eosinophils Absolute: 0.4 10*3/uL (ref 0.0–0.5)
Eosinophils Relative: 6 %
HCT: 32.8 % — ABNORMAL LOW (ref 39.0–52.0)
Hemoglobin: 10.1 g/dL — ABNORMAL LOW (ref 13.0–17.0)
Immature Granulocytes: 1 %
LYMPHS ABS: 1.8 10*3/uL (ref 0.7–4.0)
Lymphocytes Relative: 29 %
MCH: 26.5 pg (ref 26.0–34.0)
MCHC: 30.8 g/dL (ref 30.0–36.0)
MCV: 86.1 fL (ref 80.0–100.0)
Monocytes Absolute: 0.5 10*3/uL (ref 0.1–1.0)
Monocytes Relative: 7 %
NEUTROS PCT: 57 %
Neutro Abs: 3.7 10*3/uL (ref 1.7–7.7)
Platelets: 303 10*3/uL (ref 150–400)
RBC: 3.81 MIL/uL — ABNORMAL LOW (ref 4.22–5.81)
RDW: 14.3 % (ref 11.5–15.5)
WBC: 6.5 10*3/uL (ref 4.0–10.5)
nRBC: 0 % (ref 0.0–0.2)

## 2018-05-22 LAB — URINE DRUG SCREEN, QUALITATIVE (ARMC ONLY)
Amphetamines, Ur Screen: NOT DETECTED
Barbiturates, Ur Screen: NOT DETECTED
Benzodiazepine, Ur Scrn: NOT DETECTED
COCAINE METABOLITE, UR ~~LOC~~: NOT DETECTED
Cannabinoid 50 Ng, Ur ~~LOC~~: NOT DETECTED
MDMA (Ecstasy)Ur Screen: NOT DETECTED
Methadone Scn, Ur: NOT DETECTED
Opiate, Ur Screen: NOT DETECTED
Phencyclidine (PCP) Ur S: NOT DETECTED
Tricyclic, Ur Screen: NOT DETECTED

## 2018-05-22 MED ORDER — SODIUM CHLORIDE 0.9 % IV BOLUS
250.0000 mL | Freq: Once | INTRAVENOUS | Status: AC
  Administered 2018-05-22: 250 mL via INTRAVENOUS

## 2018-05-22 NOTE — ED Notes (Signed)
Pt corrected himself - it was a stent placed last week, and the pacemaker is from 3 years ago

## 2018-05-22 NOTE — ED Notes (Signed)
Pt updated on the wait for a bed at [facility]. Given phone to call his wife.

## 2018-05-22 NOTE — ED Notes (Signed)
Pt has been accepted to [facility] - awaiting bed.

## 2018-05-22 NOTE — ED Triage Notes (Signed)
Pt fell and hit head. bp was low for ems 91/57 and orthostatic. On blood thinners for stent and pacemaker. 500cc bolus of fluids given. C/o head pain

## 2018-05-22 NOTE — ED Provider Notes (Signed)
[facility] Emergency Department Provider Note    First MD Initiated Contact with Patient 05/22/18 1140     (approximate)  I have reviewed the triage vital signs and the nursing notes.   HISTORY  Chief Complaint Fall and Hypotension    HPI Edwin [name] is a 48 y.o. male   with the below listed past medical history status post recent PCI with stent placement on anticoagulation presents to the ER for syncopal episode.  States he was worried his blood pressure was getting low as he was feeling weak and diaphoretic.  Denies any chest pain or pressure.  No recent fevers cough or congestion.  No nausea or vomiting.  Denies any melena or hematochezia.   LHC 2020 [facility]: Findings: 1. Coronary artery disease including diffuse moderate to severe lesions in  the left coronary system, with particular emphasis on the 90% proximal to  mid LAD in-stent restenosis. 2. Successful PCI to the proximal to mid LAD with the placement of a  Synergy drug eluting stent with excellent angiographic result and TIMI 3  flow. 3. Subsequent impaired flow at an adjacent moderate diagonal, which was  small and had pre-existing ostial disease. Recrossed and treated with  balloon angioplasty with restored flow. 4. Recent stent to RPDA/RPLA bifurcation widely patent. 5. Normal left ventricular filling pressures (LVEDP = 8 mm Hg).  Recommendations: 1. Aggressive secondary prevention. 2. Antiplatelet therapy with ticagrelor, will hold on use of aspirin given  ongoing need for rivaroxaban, which should be continued.   Past Medical History:  Diagnosis Date  . CAD (coronary artery disease)   . Chronic systolic heart failure ([place])   . Diabetes mellitus without complication ([place])   . Heart attack ([place])   . Hyperlipidemia   . Hypertension   . Ischemic cardiomyopathy   . Paroxysmal atrial fibrillation ([name])    Family History  Problem Relation Age of Onset  . Diabetes Mother   .  Peripheral vascular disease Mother   . COPD Father    Past Surgical History:  Procedure Laterality Date  . [employer] pacemaker    . CARDIAC SURGERY    . IRRIGATION AND DEBRIDEMENT KNEE Left 12/17/2017   Procedure: LEFT KNEE DEBRIDEMENT AND SYNOVECTOMY;  Surgeon: Thornton Park, MD;  Location: [facility] ORS;  Service: Orthopedics;  Laterality: Left;  . JOINT REPLACEMENT    . LEFT HEART CATH AND CORONARY ANGIOGRAPHY Left 03/09/2018   Procedure: LEFT HEART CATH AND CORONARY ANGIOGRAPHY;  Surgeon: Nelva Bush, MD;  Location: [facility];  Service: Cardiovascular;  Laterality: Left;  . PERCUTANEOUS CORONARY STENT INTERVENTION (PCI-S)     Patient Active Problem List   Diagnosis Date Noted  . NSTEMI (non-ST elevated myocardial infarction) ([place]) 03/08/2018  . Infection of left knee ([place]) 12/16/2017      Prior to Admission medications   Medication Sig Start Date End Date Taking? Authorizing Provider  albuterol (PROVENTIL HFA;VENTOLIN HFA) 108 (90 Base) MCG/ACT inhaler Inhale 2 puffs into the lungs every 6 (six) hours as needed for wheezing or shortness of breath. 04/29/18  Yes Schuyler Amor, MD  allopurinol (ZYLOPRIM) 100 MG tablet Take 50 mg by mouth every other day. 03/18/18  Yes [provider]  amiodarone (PACERONE) 200 MG tablet Take 200 mg by mouth daily. 09/14/17  Yes [provider]  amLODipine (NORVASC) 10 MG tablet Take 10 mg by mouth daily. 10/16/17  Yes [provider]  atorvastatin (LIPITOR) 80 MG tablet Take 80 mg  by mouth every evening.  09/14/17  Yes [provider]  BRILINTA 90 MG TABS tablet Take 90 mg by mouth 2 (two) times daily. 09/14/17  Yes [provider]  bumetanide (BUMEX) 2 MG tablet Take 2 mg by mouth daily. 09/14/17  Yes [provider]  carvedilol (COREG) 25 MG tablet Take 25-50 mg by mouth See admin instructions. Take 1 tablet (25MG ) by mouth every morning and 2 tablets (50MG ) by mouth every  night 10/16/17  Yes [provider]  cholecalciferol (VITAMIN D) 1000 units tablet Take 5,000 Units by mouth daily.   Yes [provider]  ezetimibe (ZETIA) 10 MG tablet Take 10 mg by mouth daily. 10/14/17  Yes [provider]  FLUoxetine (PROZAC) 20 MG capsule Take 60 mg by mouth at bedtime.  12/04/17  Yes [provider]  gabapentin (NEURONTIN) 300 MG capsule Take 600 mg by mouth at bedtime. 03/18/18 06/16/18 Yes [provider]  hydrALAZINE (APRESOLINE) 25 MG tablet Take 25 mg by mouth 3 (three) times daily. 04/28/18  Yes [provider]  insulin aspart (NOVOLOG) 100 UNIT/ML injection Inject 15 Units into the skin 3 (three) times daily with meals. 12/20/17  Yes Sainani, Belia Heman, MD  insulin detemir (LEVEMIR) 100 UNIT/ML injection Inject 0.4 mLs (40 Units total) into the skin 2 (two) times daily. Patient taking differently: Inject 40 Units into the skin at bedtime.  12/20/17  Yes Henreitta Leber, MD  isosorbide mononitrate (IMDUR) 30 MG 24 hr tablet Take 1 tablet (30 mg total) by mouth daily. 03/12/18  Yes Epifanio Lesches, MD  losartan (COZAAR) 50 MG tablet Take 50 mg by mouth daily. 04/28/18  Yes [provider]  Melatonin 3 MG CAPS Take 9 mg by mouth at bedtime.   Yes [provider]  nitroGLYCERIN (NITROSTAT) 0.4 MG SL tablet Place 0.4 mg under the tongue every 5 (five) minutes as needed for chest pain.   Yes [provider]  OZEMPIC, 0.25 OR 0.5 MG/DOSE, 2 MG/1.5ML SOPN Inject 0.25-0.5 mg into the skin every 7 (seven) days. Inject 0.25mg  under the skin every 7 days for 4 weeks, then increase to 0.5mg  every 7 days 04/23/18  Yes [provider]  QUEtiapine (SEROQUEL) 50 MG tablet Take 50 mg by mouth 2 (two) times daily as needed (headache).  03/18/18  Yes [provider]  RENVELA 800 MG tablet Take 1,600 mg by mouth 3 (three) times daily with meals. 10/14/17  Yes [provider]  traMADol  (ULTRAM) 50 MG tablet Take 1 tablet (50 mg total) by mouth every 6 (six) hours as needed. 12/20/17  Yes Sainani, Belia Heman, MD  VOLTAREN 1 % GEL Apply 2 g topically 3 (three) times daily as needed (left leg pain).  12/04/17  Yes [provider]  XARELTO 15 MG TABS tablet Take 15 mg by mouth at bedtime.  03/14/18  Yes [provider]  aspirin EC 81 MG EC tablet Take 1 tablet (81 mg total) by mouth daily. Patient not taking: Reported on 05/11/2018 03/10/18   Fritzi Mandes, MD  heparin 25000-0.45 UT/250ML-% infusion Inject 2,000 Units/hr into the vein continuous. Patient not taking: Reported on 05/11/2018 03/10/18   Fritzi Mandes, MD    Allergies Morphine and related    Social History Social History   Tobacco Use  . 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 Smokeless tobacco: Current User    Types: Snuff  Substance Use Topics  . Alcohol use: Never    Frequency:  Never  . Drug use: Never    Review of Systems Patient denies headaches, rhinorrhea, blurry vision, numbness, shortness of breath, chest pain, edema, cough, abdominal pain, nausea, vomiting, diarrhea, dysuria, fevers, rashes or hallucinations unless otherwise stated above in HPI. ____________________________________________   PHYSICAL EXAM:  VITAL SIGNS: Vitals:   05/22/18 1200 05/22/18 1230  BP: (!) 142/95 (!) 140/91  Pulse: 71 73  SpO2: 97% 99%    Constitutional: Alert and oriented.  Eyes: Conjunctivae are normal.  Head: Atraumatic. Nose: No congestion/rhinnorhea. Mouth/Throat: Mucous membranes are moist.   Neck: No stridor. Painless ROM.  Cardiovascular: Normal rate, regular rhythm. Grossly normal heart sounds.  Good peripheral circulation. Respiratory: Normal respiratory effort.  No retractions. Lungs CTAB. Gastrointestinal: Soft and nontender. No distention. No abdominal bruits. No CVA tenderness. Genitourinary:  Musculoskeletal: No lower extremity tenderness , 1+ BLE edema.  No joint effusions. Neurologic:   Normal speech and language. No gross focal neurologic deficits are appreciated. No facial droop Skin:  Skin is warm, dry and intact. No rash noted. Psychiatric: Mood and affect are normal. Speech and behavior are normal.  ____________________________________________   LABS (all labs ordered are listed, but only abnormal results are displayed)  Results for orders placed or performed during the hospital encounter of 05/22/18 (from the past 24 hour(s))  CBC with Differential/Platelet     Status: Abnormal   Collection Time: 05/22/18 11:59 AM  Result Value Ref Range   WBC 6.5 4.0 - 10.5 K/uL   RBC 3.81 (L) 4.22 - 5.81 MIL/uL   Hemoglobin 10.1 (L) 13.0 - 17.0 g/dL   HCT 32.8 (L) 39.0 - 52.0 %   MCV 86.1 80.0 - 100.0 fL   MCH 26.5 26.0 - 34.0 pg   MCHC 30.8 30.0 - 36.0 g/dL   RDW 14.3 11.5 - 15.5 %   Platelets 303 150 - 400 K/uL   nRBC 0.0 0.0 - 0.2 %   Neutrophils Relative % 57 %   Neutro Abs 3.7 1.7 - 7.7 K/uL   Lymphocytes Relative 29 %   Lymphs Abs 1.8 0.7 - 4.0 K/uL   Monocytes Relative 7 %   Monocytes Absolute 0.5 0.1 - 1.0 K/uL   Eosinophils Relative 6 %   Eosinophils Absolute 0.4 0.0 - 0.5 K/uL   Basophils Relative 0 %   Basophils Absolute 0.0 0.0 - 0.1 K/uL   Immature Granulocytes 1 %   Abs Immature Granulocytes 0.04 0.00 - 0.07 K/uL  Comprehensive metabolic panel     Status: Abnormal   Collection Time: 05/22/18 11:59 AM  Result Value Ref Range   Sodium 139 135 - 145 mmol/L   Potassium 4.4 3.5 - 5.1 mmol/L   Chloride 110 98 - 111 mmol/L   CO2 23 22 - 32 mmol/L   Glucose, Bld 138 (H) 70 - 99 mg/dL   BUN 24 (H) 6 - 20 mg/dL   Creatinine, Ser 3.33 (H) 0.61 - 1.24 mg/dL   Calcium 8.1 (L) 8.9 - 10.3 mg/dL   Total Protein 6.6 6.5 - 8.1 g/dL   Albumin 2.5 (L) 3.5 - 5.0 g/dL   AST 17 15 - 41 U/L   ALT 14 0 - 44 U/L   Alkaline Phosphatase 61 38 - 126 U/L   Total Bilirubin 0.3 0.3 - 1.2 mg/dL   GFR calc non Af Amer 21 (L) >60 mL/min   GFR calc Af Amer 24 (L) >60 mL/min    Anion gap 6 5 - 15  Troponin I - ONCE -  STAT     Status: Abnormal   Collection Time: 05/22/18 11:59 AM  Result Value Ref Range   Troponin I 0.04 (HH) <0.03 ng/mL   ____________________________________________  EKG My review and personal interpretation at Time: 11:35   Indication: hypotension  Rate: 70  Rhythm: sinus Axis: normal Other: normal intervals, no stemi ____________________________________________  RADIOLOGY  I personally reviewed all radiographic images ordered to evaluate for the above acute complaints and reviewed radiology reports and findings.  These findings were personally discussed with the patient.  Please see medical record for radiology report.  ____________________________________________   PROCEDURES  Procedure(s) performed:  Procedures    Critical Care performed: no ____________________________________________   INITIAL IMPRESSION / ASSESSMENT AND PLAN / ED COURSE  Pertinent labs & imaging results that were available during my care of the patient were reviewed by me and considered in my medical decision making (see chart for details).   DDX: dysrhythmia, anemia, chf, pna, copd, orthostasis, medication effect  Edwin [name] is a 48 y.o. who presents to the ED with syncopal episode with head injury as described above.  Was initially hypotensive via EMS.  Blood pressure and vitals are stable here.  Denies any chest pain.  Initial EKG does not show any evidence of acute ischemia but does have some nonspecific changes which [place] be at his baseline status post recent cath.  Will interrogate pacer.  The patient will be placed on continuous pulse oximetry and telemetry for monitoring.  Laboratory evaluation will be sent to evaluate for the above complaints.     Clinical Course as of May 23 1527  Sat May 22, 2018  1430 Report from [facility] does not report any dysrhythmia.  I do feel the patient would benefit from hospitalization due to his hypotensive  episode and generalized weakness with ambulation.  No evidence of CNS injury.  Initial troponin roughly at baseline but given his recent procedure at [facility] will consult their facility for bed availability   [PR]  1516 Family requesting transfer to [facility].  I discussed case with cardiology of [facility] who agrees with transfer but currently they have limited bed availability but will place patient on the waiting list.  Agrees with plan   [PR]    Clinical Course User Index [PR] Merlyn Lot, MD     As part of my medical decision making, I reviewed the following data within the [place] notes reviewed and incorporated, Labs reviewed, notes from prior ED visits and [place] Controlled Substance Database   ____________________________________________   FINAL CLINICAL IMPRESSION(S) / ED DIAGNOSES  Final diagnoses:  Syncope, unspecified syncope type  Hypotension, unspecified hypotension type      NEW MEDICATIONS STARTED DURING THIS VISIT:  New Prescriptions   No medications on file     Note:  This document was prepared using Dragon voice recognition software and [place] include unintentional dictation errors.    Merlyn Lot, MD 05/22/18 [phone]

## 2018-05-22 NOTE — ED Triage Notes (Signed)
States "I felt this coming on - I had taken my blood pressure pills 1/2 prior". Was just discharged from hospital with a new pacemaker. Was discovered his heart was bad from frequent falls.

## 2018-05-22 NOTE — ED Notes (Signed)
Spoke with representative from [employer]. They have attempted to send fax earlier and will try to resend. There were no events on his pacemaker since last week.

## 2018-05-23 ENCOUNTER — Ambulatory Visit: Admit: 2018-05-23 | Discharge: 2018-05-23 | Payer: MEDICARE

## 2018-05-23 LAB — BASIC METABOLIC PANEL
ANION GAP: 7 mmol/L (ref 7–15)
BLOOD UREA NITROGEN: 25 mg/dL — ABNORMAL HIGH (ref 7–21)
BUN / CREAT RATIO: 7
CALCIUM: 8.5 mg/dL (ref 8.5–10.2)
CHLORIDE: 111 mmol/L — ABNORMAL HIGH (ref 98–107)
CO2: 21 mmol/L — ABNORMAL LOW (ref 22.0–30.0)
CREATININE: 3.42 mg/dL — ABNORMAL HIGH (ref 0.70–1.30)
EGFR CKD-EPI AA MALE: 23 mL/min/{1.73_m2} — ABNORMAL LOW (ref >=60)
GLUCOSE RANDOM: 134 mg/dL (ref 70–179)
POTASSIUM: 4.8 mmol/L (ref 3.5–5.0)
SODIUM: 139 mmol/L (ref 135–145)

## 2018-05-23 LAB — CBC
HEMATOCRIT: 32.7 % — ABNORMAL LOW (ref 41.0–53.0)
HEMOGLOBIN: 10.3 g/dL — ABNORMAL LOW (ref 13.5–17.5)
MEAN CORPUSCULAR HEMOGLOBIN: 27 pg (ref 26.0–34.0)
MEAN PLATELET VOLUME: 7.7 fL (ref 7.0–10.0)
PLATELET COUNT: 345 10*9/L (ref 150–440)
RED BLOOD CELL COUNT: 3.82 10*12/L — ABNORMAL LOW (ref 4.50–5.90)
RED CELL DISTRIBUTION WIDTH: 15.6 % — ABNORMAL HIGH (ref 12.0–15.0)

## 2018-05-23 LAB — GLUCOSE RANDOM: Glucose:MCnc:Pt:Ser/Plas:Qn:: 134

## 2018-05-23 LAB — MEAN CORPUSCULAR VOLUME: Lab: 85.6

## 2018-05-23 LAB — TROPONIN I: Troponin I.cardiac:MCnc:Pt:Ser/Plas:Qn:: 0.042

## 2018-05-23 LAB — MAGNESIUM: Magnesium:MCnc:Pt:Ser/Plas:Qn:: 1.7

## 2018-05-23 MED ORDER — INSULIN SYRINGE U-100 WITH NEEDLE 0.3 ML 31 GAUGE X 5/16" (8 MM)
0 refills | 0 days
Start: 2018-05-23 — End: ?

## 2018-05-23 MED ORDER — CHOLECALCIFEROL (VITAMIN D3) 125 MCG (5,000 UNIT) CAPSULE
ORAL_CAPSULE | Freq: Every day | ORAL | 0 refills | 0.00000 days
Start: 2018-05-23 — End: 2018-07-08

## 2018-05-23 MED ORDER — AMLODIPINE 10 MG TABLET
ORAL_TABLET | Freq: Every day | ORAL | PRN refills | 0 days | Status: CP
Start: 2018-05-23 — End: 2018-05-28

## 2018-05-23 NOTE — Unmapped (Signed)
Assessment/Plan:     Principal Problem:    Syncope  Active Problems:    CAD (coronary artery disease) ([facility]-[name])    Idiopathic chronic gout of left knee    Hypercholesteremia    Essential hypertension ([facility]-[name])    Obesity, Class III, BMI 40-49.9 (morbid obesity) (CMS-[name])    Chronic nonalcoholic liver disease    GERD (gastroesophageal reflux disease)    Mild intermittent asthma without complication    Chronic pain syndrome    Paroxysmal atrial fibrillation (CMS-[name])    OSA (obstructive sleep apnea)    Tobacco use disorder    Status post cardiac pacemaker procedure    Type 2 diabetes mellitus with stage 4 chronic kidney disease, with long-term current use of insulin (CMS-[name])    Chronic heart failure with reduced ejection fraction and diastolic dysfunction (CMS-[name])    CKD (chronic kidney disease) stage 4, GFR 15-29 ml/min (CMS-[name])    Acquired left foot drop    Decreased sensation of lower extremity, left    Chronic anticoagulation  Resolved Problems:    * No resolved hospital problems. *      Jeremy Oliver is a 48 y.o. male that presented to [facility] with Syncope.    Syncope  ICD interrogation showed no events. Certainly must consider arrhythmia on the differential, but in the setting of diuresis and multiple anti-hypertensives with resumption of losartan (possibly higher dose than before as wife Benham have forgotten to cut the pill in half) in addition having dizziness, I suspect this to be from hypotension. However, with his HFrEF and tenuous volume status we will discuss with cardiology regarding medication management of diuretics in this setting.  - Cardiology consult - discuss with them bumex and losartan which are on hold currently in s/o syncopal event w/ c/f hypotension  - Continue home Coreg, Amlodipine, Imdur  - Telemetry  - Repeat orthostatics this afternoon    Elevated Troponin, downtrending  Likely residual given CKD and recent NSTEMI. He is chest pain free still.    CKD:   Creatinine now back to baseline of around 3.   - Continue Ca, Sevelamer   ??  CAD w/ recent NSTEMI and Stent Placement: Continue ticagrelor, atorvastatin, zetia  ??  DM2: Home 40 of lantus and 15 units lispro w/ meals  - Hold semaglutide   ??  Paroxsymal Afib: Continue Coreg 25mg  qAM , Continue Xareltto   ??  Depression: Continue Prozac  ??  Neuropathy: Home gabapentin dose  ??  Gout: Continue allopurinol   ??  FEN/GI: Heart Healthy diet  ??  DVT prophylaxis: Pt already on theraputic anticoagulation    ______________________________________________________________________    Subjective:   No acute events overnight, Denies CP or SOB, Tolerating diet without difficulty    He reports some light-headedness overnight when he went to use the bathroom. He also endorses light-headedness during orthostatic vital signs this AM. He continues to deny having any palpitations prior to yesterday's events and also since. He has not had any shocks from his ICD.    Labs/Studies/Diagnostics:   Labs and Studies from the last 24hrs per EMR and Reviewed    Objective:   Temp:  [36.3 ??C-37.1 ??C] 36.7 ??C  Heart Rate:  [70-91] 86  Resp:  [18-21] 20  BP: (89-144)/(58-86) 124/59  SpO2:  [92 %-97 %] 97 %    General: obese gentleman lying in bed in no acute distress  HEENT: MMM  CV: RRR, no murmurs, rubs, or gallops. Normal S1/S2. JVD elevated to ~  7cm (5cm + 2cm above clavicle at 30 degree angle). 1+ LE pitting edema bilaterally.  Pulm: Normal WOB on RA. CTAB in anterior lung fields.  Abd: Normoactive bowel sounds, soft, nontender, nondistended  Neuro: A&O x4.

## 2018-05-23 NOTE — Unmapped (Signed)
Medicine Access Physician (MAP): Transfer Center Request Note    Requesting Physician:  Dr. Roxan Hockey    Requesting Hospital: [facility]    Requesting Service: Emergency Medicine    Reason for Transfer: Cardiology    Brief Hospital Course: The patient presented to [facility] earlier this afternoon for evaluation following a syncopal episode.  Per report, he was standing and talking with his family when he began to feel light headed, diaphoretic and subsequently passed out.  Family estimates that he had lost consciousness for approximately 30 seconds before returning to mental baseline.  He denies chest pain or palpitations surrounding the event, but admits some suspicion that his blood pressure [place] have dropped too low since he had just take his BP meds about 30 minutes prior to this event.  Blood pressure was low for EMS at the time of their arrival (91/57 and orthostatic).     The patient was admitted to cardiology here at [facility] just last week and had a stent placed to the LAD on 2/13.  It is not clear to me that any of his blood pressure medication was increased during this encounter (in fact, losartan has been held due to a bump in his creatinine).     Blood pressure has improved in the Emergency Department at [facility].  He has been given a bolus.  EKG shows no acute ischemic changes.  Creatinine is 3.3 which is improved compared to our most recent values.  Troponin is 0.04.  Troponin at last check here at [facility] was 0.277 (in the setting of renal failure as described above).  ICD was interrogated in the ED at [facility] and showed no events.    Plan Upon Arrival: Telemetry overnight.  Discuss with cards in AM>    Bed Type: Observation Unit - discussed with observation unit attending (agrees with transfer)  Are there any clinical exclusion criteria as of the current date, as defined in the [facility], to providing the patient's clinical care at [facility]? If yes, please indicate the exclusion criteria: NO      Accepting Service: Appropriate for medicine services as determined by [facility] for regionalization.    Imaging Needed:  No.    [employer] Affiliate:  No.  Reminded facility to provide imaging with transfer paperwork.  (What is this?)    Please check under Care Everywhere and/or External Records in the patient's Media Tab to see if a discharge summary has been previously sent electronically.    Patient accepted from an emergency departement and therefore back transfer was not discussed

## 2018-05-23 NOTE — Unmapped (Signed)
Physician Discharge Summary [facility]  [facility] OBSERVATION [facility]  [address]  [place] [address]  Dept: [phone]  Loc: [phone]     Identifying Information:   Jeremy Oliver  28-Kinderman-1972  [id number]    Primary Care Physician: Kurtis Bushman, MD   Code Status: Full Code    Admit Date: 05/22/2018    Discharge Date: 05/23/2018     Discharge To: Home    Discharge Service: Observation 1     Discharge Attending Physician: Ledell Peoples, MD    Discharge Diagnoses:  Principal Problem:    Syncope POA: Unknown  Active Problems:    CAD (coronary artery disease) (RAF-[name]) POA: Yes    Idiopathic chronic gout of left knee POA: Yes    Hypercholesteremia POA: Yes    Essential hypertension (RAF-[name]) POA: Yes    Obesity, Class III, BMI 40-49.9 (morbid obesity) (CMS-[name]) POA: Yes    Chronic nonalcoholic liver disease POA: Yes    GERD (gastroesophageal reflux disease) POA: Yes    Mild intermittent asthma without complication POA: Yes    Chronic pain syndrome POA: Yes    Paroxysmal atrial fibrillation (CMS-[name]) POA: Yes    OSA (obstructive sleep apnea) POA: Yes    Tobacco use disorder POA: Yes    Status post cardiac pacemaker procedure POA: Yes    Type 2 diabetes mellitus with stage 4 chronic kidney disease, with long-term current use of insulin (CMS-[name]) POA: Not Applicable    Chronic heart failure with reduced ejection fraction and diastolic dysfunction (CMS-[name]) POA: Yes    CKD (chronic kidney disease) stage 4, GFR 15-29 ml/min (CMS-[name]) POA: Yes    Acquired left foot drop POA: Yes    Decreased sensation of lower extremity, left POA: Yes    Chronic anticoagulation POA: Not Applicable  Resolved Problems:    * No resolved hospital problems. *      Outpatient Provider Follow Up Issues:   - Monitoring BP as it appears this syncopal event was 2/2 hypotension. Decreased amlodipine to 5mg  daily.  - Medication reconciliation after discharge to ensure he is taking what he is supposed to.    Hospital Course:   Jeremy Oliver is a 48 y.o. male with PMHx of CAD, STEMI (2018) complicated by cardiogenic shock following VT arrest with emergent PCI to mid LAD,??Ischemic cardiomyopathy??with ICD [facility]),??CKD, paroxysmal afib, HTN,??HLD,??T2DM, and morbid obesity who presented to [facility] with syncope.     Syncopal Episodes: He had 2, possibly 3 episodes on 2/22. Brought to [facility], where EKG showed no acute ischemic changes. Of note, he was recently admitted to [facility] (2/11-2/18/2020) for workup of NSTEMI with peak troponin at 1.09 on 2/11. He had a LHC/PCl to the proximal to mid LAD with the placement of a Synergy drug eluting stent on 2/13. His Losartan was held during this admission because of AKI but was restarted 2/22, taking his first dose prior to syncopizing. He has continued taking his bumex daily per his wife, who manages his medications.  The patient told us he was exerting himself when he passed out and that he had taken a higher dose of losartan than prescribed because his wife Tidwell have forgotten to cut his pill in half. We presumed this was the etiology of his syncope - hypotension in setting of too high dose of anti-hypertensive and exerting with dehydration. We checked orthostatics here which were negative. He has not acute EKG changes and his troponin is likely residual from recent NSTEMI given  CKD, but then it downtrended. He denies ICD discharges and ICD was interrogated with no events. Cardiology evaluated him who reported the same findings and felt he required no further inpatient workup. We called his wife who reported that he was taking all his doses exactly as prescribed and he did not take more losartan than should have because it is not a pill that is cut. She also said he wasn't feeling well all day and was not doing any exertion when this event occurred. It still seems like the etiology of this event was too many anti-hypertensives, so we d/w cardiology and decided to continue losartan as this is beneficial for his heart failure. We instructed the patient and his wife to decrease his amlodipine from 10mg  daily to 5mg  daily and to keep a BP log. If he starts having lower BP's he will stop the amlodipine and call his cardiologist. He was discharged with a thorough medication reconciliation and was given a referral for [facility] Follow up.    CAD w/ recent NSTEMI and Stent Placement: Continued ticagrelor, atorvastatin, zetia.    CKD: His creatinine was at his baseline of around 3 on admission. Continued home Ca and Sevelamer.  ??  DM2: Home 40 of lantus and 15 units lispro w/ meals. Held semaglutide while inpatient, but resumed on discharge.  ??  Paroxsymal Afib: Continued Coreg 25mg  qAM and 50mg  qHS. Continued Xareltto.  ??  Depression: Continued Prozac.  ??  Neuropathy: Continued gabapentin.  ??  Gout: Continued allopurinol.    Touchbase with Outpatient Provider:  Warm Handoff: Completed on 05/23/18 by Rosette Reveal  (Intern) via [facility] Message    Procedures:  None  No admission procedures for hospital encounter.  ______________________________________________________________________  Discharge Medications:     Your Medication List      CHANGE how you take these medications    amLODIPine 10 MG tablet  Commonly known as:  NORVASC  Take 0.5 tablets (5 mg total) by mouth daily.  What changed:  how much to take     cholecalciferol (vitamin D3) 5,000 unit capsule  Take 1 capsule (5,000 Units total) by mouth daily.  What changed:    ?? how much to take  ?? how to take this  ?? when to take this     insulin syringe-needle U-100 0.3 mL 31 gauge x 5/16 Syrg  Use to inject insulin twice daily with meals  What changed:  additional instructions        CONTINUE taking these medications    acetaminophen 500 MG tablet  Commonly known as:  TYLENOL  TAKE 2 TABLETS (1 000MG ) BY MOUTH EVERY 8 HOURS AS NEEDED FOR PAIN     allopurinoL 100 MG tablet  Commonly known as:  ZYLOPRIM  Take 0.5 tablets (50 mg total) by mouth every other day. amiodarone 200 MG tablet  Commonly known as:  PACERONE  TAKE 1 TABLET BY MOUTH ONCE DAILY     atorvastatin 80 MG tablet  Commonly known as:  LIPITOR  Take 1 tablet (80 mg total) by mouth daily.     blood-glucose meter kit  Use to check blood sugars, #1 meter, #100 strips. Freestyle glucose meter. ICD-9 250.00     BRILINTA 90 mg Tab  Generic drug:  ticagrelor  TAKE 1 TABLET BY MOUTH TWICE DAILY     bumetanide 2 MG tablet  Commonly known as:  BUMEX  TAKE 1 TABLET BY MOUTH ONCE DAILY     carvediloL 25 MG  tablet  Commonly known as:  COREG  TAKE 1 TABLET BY MOUTH WITH THE MORNING MEAL AND TAKE 2 TABLETS WITH THE EVENING MEAL     diclofenac sodium 1 % gel  Commonly known as:  VOLTAREN  Apply 2 g topically Three (3) times a day as needed for pain.     empty container Misc  Use as directed to dispose of injectable medications     ezetimibe 10 mg tablet  Commonly known as:  ZETIA  TAKE 1 TABLET BY MOUTH ONCE DAILY     FLUoxetine 20 MG capsule  Commonly known as:  PROzac  Take 60 mg by mouth nightly.     FREESTYLE LITE STRIPS Strp  Generic drug:  blood sugar diagnostic  USE TO CHECK BLOOD SUGAR 4 TIMES DAILY (BEFORE MEALS AND NIGHTLY)     FREESTYLE NAVIGATOR GLUC SENS Devi  Generic drug:  blood-glucose sensor  Frequency:BID   Dosage:0.0     Instructions:  Note:Dose: 1     gabapentin 300 MG capsule  Commonly known as:  NEURONTIN  Take 2 capsules (600 mg total) by mouth nightly.     hydrALAZINE 25 MG tablet  Commonly known as:  APRESOLINE  Take 1 tablet (25 mg total) by mouth Three (3) times a day.     insulin detemir U-100 100 unit/mL (3 mL) injection pen  Commonly known as:  LEVEMIR  Inject 40 Units under the skin nightly.     isosorbide mononitrate 30 MG 24 hr tablet  Commonly known as:  IMDUR  Take 1 tablet (30 mg total) by mouth daily.     losartan 50 MG tablet  Commonly known as:  COZAAR  Take 1 tablet (50 mg total) by mouth daily.     melatonin 3 mg Cap  Take 9 mg by mouth nightly.     nitroglycerin 0.4 MG SL tablet Commonly known as:  NITROSTAT  Place 1 tablet (0.4 mg total) under the tongue every five (5) minutes as needed for chest pain.     NovoLOG Flexpen U-100 Insulin 100 unit/mL (3 mL) injection pen  Generic drug:  insulin ASPART  Inject 0.15 mL (15 Units total) under the skin Three (3) times a day with a meal.     ON CALL LANCET 30 gauge Misc  Generic drug:  lancets  USE TO CHECK BLOOD SUGAR THREE TIMES DAILY WITH MEALS     OZEMPIC 0.25 mg or 0.5 mg(2 mg/1.5 mL) Pnij  Generic drug:  semaglutide  Inject 0.25 mg under the skin every seven (7) days. For 4 weeks then increase to 0.5mg  every 7 days     PRALUENT PEN 75 mg/mL Pnij  Generic drug:  alirocumab  INJECT THE CONTENTS OF 1 PEN (75 MG) UNDER THE SKIN EVERY 14 DAYS     QUEtiapine 50 MG tablet  Commonly known as:  SEROquel  Take 1 tablet (50 mg total) by mouth two (2) times a day as needed (headache).     rivaroxaban 15 mg Tab  Commonly known as:  XARELTO  Take 1 tablet (15 mg total) by mouth daily with evening meal.     sevelamer 800 mg tablet  Commonly known as:  RENVELA  TAKE 2 TABLETS (1600MG ) BY MOUTH THREE TIMES DAILY WITH MEALS     traMADol 50 mg tablet  Commonly known as:  ULTRAM  Take 1 tablet (50 mg total) by mouth every six (6) hours as needed.     UNIFINE PENTIPS 31 gauge  x 5/16 Ndle  Generic drug:  pen needle, diabetic  USE WITH INSULIN AND VICTOZA 5 TIMES DAILY     VENTOLIN HFA 90 mcg/actuation inhaler  Generic drug:  albuterol  Inhale 2 puffs into the lungs every 6 (six) hours as needed for wheezing or shortness of breath.            Allergies:  Morphine  ______________________________________________________________________  Pending Test Results (if blank, then none):      Most Recent Labs:  All lab results last 24 hours -   Recent Results (from the past 24 hour(s))   Comprehensive Metabolic Panel    Collection Time: 05/22/18  9:43 PM   Result Value Ref Range    Sodium 140 135 - 145 mmol/L    Potassium 4.7 3.5 - 5.0 mmol/L    Chloride 112 (H) 98 - 107 mmol/L    Anion Gap 9 7 - 15 mmol/L    CO2 19.0 (L) 22.0 - 30.0 mmol/L    BUN 26 (H) 7 - 21 mg/dL    Creatinine 1.61 (H) 0.70 - 1.30 mg/dL    BUN/Creatinine Ratio 8     EGFR CKD-EPI Non-African American, Male 20 (L) >=60 mL/min/1.2m2    EGFR CKD-EPI African American, Male 24 (L) >=60 mL/min/1.18m2    Glucose 187 (H) 70 - 179 mg/dL    Calcium 8.5 8.5 - 09.6 mg/dL    Albumin 2.9 (L) 3.5 - 5.0 g/dL    Total Protein 5.9 (L) 6.5 - 8.3 g/dL    Total Bilirubin 0.3 0.0 - 1.2 mg/dL    AST 22 19 - 55 U/L    ALT 14 <50 U/L    Alkaline Phosphatase 82 38 - 126 U/L   Magnesium Level    Collection Time: 05/22/18  9:43 PM   Result Value Ref Range    Magnesium 1.7 1.6 - 2.2 mg/dL   Troponin I    Collection Time: 05/22/18  9:43 PM   Result Value Ref Range    Troponin I 0.045 (HH) <0.034 ng/mL   CBC w/ Differential    Collection Time: 05/22/18  9:43 PM   Result Value Ref Range    WBC 7.6 4.5 - 11.0 10*9/L    RBC 3.72 (L) 4.50 - 5.90 10*12/L    HGB 10.2 (L) 13.5 - 17.5 g/dL    HCT 04.5 (L) 40.9 - 53.0 %    MCV 86.1 80.0 - 100.0 fL    MCH 27.3 26.0 - 34.0 pg    MCHC 31.7 31.0 - 37.0 g/dL    RDW 81.1 (H) 91.4 - 15.0 %    MPV 7.9 7.0 - 10.0 fL    Platelet 347 150 - 440 10*9/L    Neutrophils % 52.6 %    Lymphocytes % 32.8 %    Monocytes % 5.8 %    Eosinophils % 5.7 %    Basophils % 0.7 %    Absolute Neutrophils 4.0 2.0 - 7.5 10*9/L    Absolute Lymphocytes 2.5 1.5 - 5.0 10*9/L    Absolute Monocytes 0.4 0.2 - 0.8 10*9/L    Absolute Eosinophils 0.4 0.0 - 0.4 10*9/L    Absolute Basophils 0.1 0.0 - 0.1 10*9/L    Large Unstained Cells 2 0 - 4 %    Microcytosis Slight (A) Not Present    Hypochromasia Marked (A) Not Present   Morphology Review    Collection Time: 05/22/18  9:43 PM   Result Value Ref Range  Smear Review Comments See Comment (A) Undefined   POCT Glucose    Collection Time: 05/22/18 11:13 PM   Result Value Ref Range    Glucose, POC 146 70 - 179 mg/dL   Basic metabolic panel    Collection Time: 05/23/18  3:43 AM   Result Value Ref Range    Sodium 139 135 - 145 mmol/L    Potassium 4.8 3.5 - 5.0 mmol/L    Chloride 111 (H) 98 - 107 mmol/L    CO2 21.0 (L) 22.0 - 30.0 mmol/L    Anion Gap 7 7 - 15 mmol/L    BUN 25 (H) 7 - 21 mg/dL    Creatinine 1.61 (H) 0.70 - 1.30 mg/dL    BUN/Creatinine Ratio 7     EGFR CKD-EPI Non-African American, Male 20 (L) >=60 mL/min/1.65m2    EGFR CKD-EPI African American, Male 23 (L) >=60 mL/min/1.66m2    Glucose 134 70 - 179 mg/dL    Calcium 8.5 8.5 - 09.6 mg/dL   Magnesium Level    Collection Time: 05/23/18  3:43 AM   Result Value Ref Range    Magnesium 1.7 1.6 - 2.2 mg/dL   CBC    Collection Time: 05/23/18  3:43 AM   Result Value Ref Range    WBC 7.4 4.5 - 11.0 10*9/L    RBC 3.82 (L) 4.50 - 5.90 10*12/L    HGB 10.3 (L) 13.5 - 17.5 g/dL    HCT 04.5 (L) 40.9 - 53.0 %    MCV 85.6 80.0 - 100.0 fL    MCH 27.0 26.0 - 34.0 pg    MCHC 31.5 31.0 - 37.0 g/dL    RDW 81.1 (H) 91.4 - 15.0 %    MPV 7.7 7.0 - 10.0 fL    Platelet 345 150 - 440 10*9/L   Troponin I    Collection Time: 05/23/18  3:43 AM   Result Value Ref Range    Troponin I 0.042 (HH) <0.034 ng/mL   POCT Glucose    Collection Time: 05/23/18  8:01 AM   Result Value Ref Range    Glucose, POC 200 (H) 70 - 179 mg/dL   POCT Glucose    Collection Time: 05/23/18 11:20 AM   Result Value Ref Range    Glucose, POC 226 (H) 70 - 179 mg/dL       Relevant Studies/Radiology (if blank, then none):  Xr Chest 1 View    Result Date: 05/23/2018  EXAM: CHEST ONE VIEW DATE: 05/22/2018 10:22 PM ACCESSION: [id number] UN DICTATED: 05/23/2018 6:08 AM INTERPRETATION LOCATION: Main Campus CLINICAL INDICATION: 48 years old Male with COUGH  COMPARISON: 05/16/2018 TECHNIQUE:  Portable  sitting upright AP view of the chest. CONCLUSIONS: Left-sided pacemaker with leads in stable position. Prominent heart and mediastinal contours, unchanged. Negative for pulmonary edema, focal consolidation, pneumothorax or pleural effusion.     ______________________________________________________________________ Discharge Instructions:   Activity Instructions     Activity as tolerated                Other Instructions     Call MD for:  difficulty breathing, headache or visual disturbances      Call MD for:  extreme fatigue      Call MD for:  hives      Call MD for:  persistent dizziness or light-headedness      Call MD for:  persistent nausea or vomiting      Call MD for:  severe uncontrolled pain  Call MD for: Temperature > 38.5 Celsius ( > 101.3 Fahrenheit)      Discharge instructions      You were admitted to [facility] with two fainting episodes. We checked your heart and your bloodwork and did not find that those led to your fainting episodes. We also checked your defibrillator and it is working well and did not lead to these episodes as well. We discussed your case with your cardiologists and they came to see you as well. We believe what occurred was your blood pressure got too low when you started your losartan medicine. This is a very important medicine for you, so here is how we will make changes:  - Please continue all your medicines the same as listed below except please now take Amlodipine 5mg  one time per day (STOP taking 10mg ).   - We also encourage you to keep a blood pressure log. Keep track on paper what your blood pressure is in the morning and evening each day and bring it with you to your next doctor's office appointment. If you start to see the top number getting too low (around 90-100), please STOP the amlodipine and call your cardiologist.    Seek medical attention if you develop fever, chest pain, unexplained/unrelieved shortness of breath, any changes in your mental status, significant abdominal pain, uncontrolled nausea and vomiting such that you cannot stay hydrated or take your medications, worsening swelling, or any other concerning symptoms.     FOLLOW-UP PLAN:  Please call to schedule an appointment to follow-up with your primary care doctor within the next 7-10 days    You are scheduled to follow-up with Cardiology on 06/07/18.    Please continue to follow-up with your outpatient care providers. Some of your follow-up appointments have been listed below. Please be sure to attend these appointments at the dates and times indicated, and to bring all of your medications with you to help your provider better guide your care.         Discharge instructions to patient: Call your primary care doctor and make an appointment to see them:      Within 2 weeks from the time you are discharged from the hospital                    Follow Up instructions and Outpatient Referrals     Ambulatory referral to Internal Medicine      Call MD for:  difficulty breathing, headache or visual disturbances      Call MD for:  extreme fatigue      Call MD for:  hives      Call MD for:  persistent dizziness or light-headedness      Call MD for:  persistent nausea or vomiting      Call MD for:  severe uncontrolled pain      Call MD for: Temperature > 38.5 Celsius ( > 101.3 Fahrenheit)      Discharge instructions            Appointments which have been scheduled for you    Jun 07, 2018  3:00 PM EDT  (Arrive by 2:45 PM)  PHARMACOTHERAPY with Sherren Kerns, CPP  [facility] HEART VASCULAR CTR [facility]ARD[facility] [address] [facility] REGION) [address]  [address]  [place] [address]  [phone]      Jun 22, 2018 10:00 AM EDT  RETURN  RETINA with Melvyn Novas, MD  [facility]  [address] [facility] REGION) [address]  [address]  [place] [address]  [phone]      Jun 29, 2018  1:00 PM EDT  (Arrive by 12:45 PM)  RETURN ANTICOAG with Leda Min, PA  [facility] INTERNAL MEDICINE [facility] REGION) [address] [place] [address]  [phone]      Jun 29, 2018  2:00 PM EDT  (Arrive by 1:45 PM)  RETURN  ATTENDING with Roma Schanz, MD  [facility] INTERNAL MEDICINE [place] [facility] REGION) [address] [place] [address]  [phone]      Jun 29, 2018  3:00 PM EDT  (Arrive by 2:45 PM)  RETURN COUNSELING with Ebbie Latus, LCSW  [facility] INTERNAL MEDICINE [facility] REGION) [address]  [place] [address]  [phone]      Jul 08, 2018 11:30 AM EDT  (Arrive by 11:15 AM)  Danelle Earthly RETURN with Marlaine Hind, MD  [facility] HEART VASCULAR CTR [facility] [address] [facility] REGION) [address]  [address]  [place] [address]  [phone]      Jul 20, 2018  9:30 AM EDT  (Arrive by 9:15 AM)  RETURN  GENERAL with Mila Merry, MD  [place] [facility] REGION) [address]  [place] [address]  [phone]           ______________________________________________________________________  Discharge Day Services:  BP 124/59  - Pulse 86  - Temp 36.7 ??C (Oral)  - Resp 20  - Ht 188 cm (6' 2)  - Wt (!) 133.5 kg (294 lb 5 oz)  - SpO2 97%  - BMI 37.79 kg/m??   Pt seen on the day of discharge and determined appropriate for discharge.    Condition at Discharge: good    Length of Discharge: I spent greater than 30 mins in the discharge of this patient.

## 2018-05-23 NOTE — Unmapped (Signed)
Plan of care reviewed with patient who states understanding.  Patient reports improving symptoms but continues to be light-headed when standing and ambulating.  Swelling noted to BLE with foot drop on left.  Lung sounds diminished and clear bilaterally.  Patient has been groggy and sleeping.    VS:  Vitals:    05/23/18 0805 05/23/18 1118 05/23/18 1121 05/23/18 1123   BP: 116/72 133/86 121/76 124/59   Pulse: 91 72 79 86   Resp:  20     Temp:  36.7 ??C (98.1 ??F)     TempSrc:  Oral     SpO2:  97%     Weight:       Height:         Patient resting comfortably and has no further questions or concerns at this time.  Will continue to monitor.        Problem: Adult Inpatient Plan of Care  Goal: Plan of Care Review  05/23/2018 1247 by Randall Hiss, RN  Outcome: Progressing  Flowsheets (Taken 05/23/2018 1247)  Progress: improving  Plan of Care Reviewed With: patient  05/23/2018 1246 by Randall Hiss, RN  Flowsheets (Taken 05/23/2018 1246)  Progress: improving  Plan of Care Reviewed With: patient  Goal: Patient-Specific Goal (Individualization)  Outcome: Progressing  Goal: Absence of Hospital-Acquired Illness or Injury  Outcome: Progressing  Goal: Optimal Comfort and Wellbeing  Outcome: Progressing  Goal: Readiness for Transition of Care  Outcome: Progressing  Goal: Rounds/Family Conference  05/23/2018 1247 by Randall Hiss, RN  Outcome: Progressing  Flowsheets (Taken 05/23/2018 1247)  Clinical EDD (Estimated Discharge Date): 05/24/2018  Participants:  ??? patient  ??? physician  ??? nursing  05/23/2018 1246 by Randall Hiss, RN  Flowsheets (Taken 05/23/2018 1246)  Clinical EDD (Estimated Discharge Date): 05/24/2018  Participants:  ??? patient  ??? physician  ??? nursing     Problem: Wound  Goal: Optimal Wound Healing  Outcome: Progressing     Problem: Fall Injury Risk  Goal: Absence of Fall and Fall-Related Injury  Outcome: Progressing     Problem: Cardiac Output Decreased (Heart Failure)  Goal: Optimal Cardiac Output  Outcome: Progressing Problem: Fluid Imbalance (Heart Failure)  Goal: Fluid Balance  Outcome: Progressing     Problem: Sleep Disordered Breathing (Heart Failure)  Goal: Effective Breathing Pattern During Sleep  Outcome: Progressing

## 2018-05-23 NOTE — Unmapped (Signed)
Care Management  Initial Transition Planning Assessment              General Assessment:   Care Manager assessed the patient by : In person interview with patient  Orientation Level: Oriented X4  Who provides care at home?: N/A(Patient reports being independent with ADL's at baseline. Patient reports having access to the following DME: cane, rolling walker, wheelchair and a showerchair and CPAP. )    Contact/Decision Maker  Extended Emergency Contact Information  Primary Emergency Contact: Kersten,[name]  Address: [address] [place], [place] [address] [place]  Home Phone: [phone]  Mobile Phone: [phone]  Relation: Spouse  Secondary Emergency Contact: Crispo,Bridgette   [place]  Home Phone: [phone]  Mobile Phone: [phone]  Relation: Daughter    Legal Next of Kin / Guardian / POA / Advance Directives       Advance Directive (Medical Treatment)  Does patient have an advance directive covering medical treatment?: Patient does not have advance directive covering medical treatment.(Patient's designated Avon Gully is his spouse [name] [place]; Phone: [phone])  Information provided on advance directive:: No  Patient requests assistance:: No    Advance Directive (Mental Health Treatment)  Does patient have an advance directive covering mental health treatment?: Patient does not have advance directive covering mental health treatment.    Patient Information  Lives with: Spouse/significant other, Children    Type of Residence: Private residence  Location/Detail: [address] [place], [place] [address]; Phone: [phone]     Type of Residence: Mailing Address:  [address]  [place]  [place] [address]  Contacts: Accompanied by: Family member  Patient Phone Number: [phone]        Medical Provider(s): Kurtis Bushman, MD  Reason for Admission: Admitting Diagnosis:  syncopal episode  Past Medical History:   has a past medical history of Angina at rest (CMS-[name]), Arthritis, Asthma, Atrial fibrillation and flutter (CMS-[name]) (06/08/14), BMI 40.0-44.9, adult (CMS-[name]) (10/29/2015), Chronic anticoagulation (02/17/2018), Chronic kidney disease, stage 3 (CMS-[name]), Chronic pain syndrome (04/11/2014), Coronary artery disease, Depression, Diabetes mellitus (CMS-[name]), Eye trauma (1998), GERD (gastroesophageal reflux disease), Gout, Homeless, Hyperlipidemia, Hypertension, Illiteracy and low-level literacy, Microscopic hematuria, Migraine, Nephrolithiasis, 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  Past Surgical History:   has a past surgical history that includes Knee surgery (Right, 09/23/2006); Cardiac catheterization (03/08/2007); Cystourethroscopy (12/31/2009 ); pr inser hart pacer xvenous atrial (N/A, 05/30/2015); pr ephys eval w/ ablation supravent arrhythmia (N/A, 07/19/2015); pr upper gi endoscopy,biopsy (N/A, 02/28/2016); pr upper gi endoscopy,diagnosis (N/A, 05/07/2016); pr cath place/coron angio, img super/interp,r&l hrt cath, l hrt ventric (N/A, 02/13/2017); pr cath place/coron angio, img super/interp,w left heart ventriculography (N/A, 03/17/2017); pr insert/place flow direct cath (N/A, 03/17/2017); pr ecmo/ecls initiation veno-arterial (N/A, 03/19/2017); pr rebl ves direct,low extrem (Left, 03/19/2017); pr ecmo/ecls rmvl prph cannula open 6 yrs & older (Left, 03/25/2017); pr remv art clot iliac-pop,leg incis (Left, 03/25/2017); pr negative pressure wound therapy dme >50 sq cm (Left, 03/25/2017); pr insj/rplcmt perm dfb w/trnsvns lds 1/dual chmbr (N/A, 05/04/2017); pr prq trluml coronary stent w/angio one art/brnch (N/A, 03/12/2018); and pr cath place/coron angio, img super/interp,w left heart ventriculography (N/A, 05/12/2018).   Previous admit date: 05/11/2018    Primary Insurance- Payor: MEDICARE /  Plan: MEDICARE PART A AND PART B / Product Type: *No Product type* /   Secondary Insurance ??? Secondary Insurance  MEDICAID [place]  Prescription Coverage ???   Preferred Pharmacy - [facility] - [place], [place] - [address]  [place] [facility]  [id number] - [place] - [address]  [facility] [place] - [address]  [facility] [place], [place] - [address]  [facility]  [facility]    Transportation home: Private vehicle     Level of function prior to admission: Independent    Support Systems: Children, Family Members, Spouse    Responsibilities/Dependents at home?: No    Home Care services in place prior to admission?: No    Equipment Currently Used at Home: cane, straight, shower chair, walker, rolling, wheelchair, manual(CPAP)    Currently receiving outpatient dialysis?: No    Financial Information    Need for financial assistance?: No(Patient reports receiving SSDI)    Social Determinants of Health  Social Determinants of Health were addressed in provider documentation.  Please refer to patient history.    Discharge Needs Assessment  Concerns to be Addressed: denies needs/concerns at this time    Clinical Risk Factors: Multiple Diagnoses (Chronic), New Diagnosis    Barriers to taking medications: No    Prior overnight hospital stay or ED visit in last 90 days: No    Readmission Within the Last 30 Days: no previous admission in last 30 days    Anticipated Changes Related to Illness: none    Equipment Needed After Discharge: none    Discharge Facility/Level of Care Needs: (Patient's reported discharge plan is to return home with spouse and children.)    Readmission  Risk of Unplanned Readmission Score:36%  Predictive Model Details           36% (High) Factors Contributing to Score   Calculated 05/23/2018 11:09 27% Number of active Rx orders is 59   [place] Risk of Unplanned Readmission Model 15% Number of ED visits in last six months is 4     10% Number of hospitalizations in last year is 3     6% Active antipsychotic Rx order is present     6% ECG/EKG order is present in last 6 months     5% Latest BUN is high (25 mg/dL)     5% Diagnosis of electrolyte disorder is present     5% Charlson Comorbidity Index is 6     Readmitted Within the Last 30 Days? (No if blank)   Patient at risk for readmission?: N/A    Discharge Plan  Screen findings are: Care Manager reviewed the plan of the patient's care with the Multidisciplinary Team. No discharge planning needs identified at this time. Care Manager will continue to manage plan and monitor patient's progress with the team.    Expected Discharge Date: (TBD)    Expected Transfer from Critical Care: N/A    Patient and/or family were provided with choice of facilities / services that are available and appropriate to meet post hospital care needs?: N/A    Initial Assessment complete?: Yes

## 2018-05-23 NOTE — Unmapped (Signed)
Device Interrogation:    [employer]: Vigilant EL ICD [phone]  Implanted 05/04/2017    A lead: Amplitude 3.0 mV / Impedance 650 ohms / Threshold 1.0V @ 0.4 ms    V lead: Amplitude 9.0 mV / Impedance 350 Ohms / Threshold 0.5V @ 0.4 ms    23% A-Paced  <1% V-Paced  AT/AF <1    Mode: DDD with mode switch to DDIR at ATR 150 BPM.    Tachy Therapy:  AF 230 BPM 41J  VT 180 BPM Scan, ATP off 41J  VT-1 160 BPM Monitor    No events noted since last interrogation in office 03/11/2018.

## 2018-05-23 NOTE — Unmapped (Signed)
Cardiology Consultation Note    Requesting Physician: Zannie Cove, MD Consulting Attending: Kennis Carina, MD   Requesting Service: General Medicine (MED) Consulting Fellow: Rodman Key, MD     Reason for Consultation:   Syncope    Assessment & Recommendations:  Jeremy Oliver is a 48 year old man with a history of CAD, STEMI in 2018 complicated by cardiogenic shock following VT arrest with emergent PCI to mid LAD and recent PCI to mid LAD (05/2018),??Ischemic cardiomyopathy??with ICD [facility] Scientific),??CKD, paroxysmal afib, HTN,??HLD,??T2DM, and morbid obesity who presented to [facility] on 05/22/2018 after a syncopal event.    Syncope: The patient's syncopal event appears to be iatrogenic. His creatinine and troponin are both improving after he presented with an NSTEMI earlier this month. It is important to make sure the patient's wife (who provides the patient his medications) is administering the right dose. The patient was likely overexerting himself as he was moving from his old house without much assistance. It is reassuring that there were no significant dysrhythmia noted on interrogation.  - Restart blood pressure medications and diuretics as tolerated  - No need for further inpatient cardiac evaluation at this time    Thank you for involving [place] in this patient's care.  We will follow-up on the results of any recommended diagnostic studies and contact you regarding any additional recommendations.  This patient was seen and discussed with Dr. Laurice Record who is in agreement with the above recommendations.  If any further questions arise please page the Cardiology Consult pager [phone]) Monday to Friday from 8am to 5pm or the on-call Cardiology pager [phone]) nights and weekends.       History of Present Illness:  Jeremy Oliver is a 47 year old man with a history of CAD, STEMI in 2018 complicated by cardiogenic shock following VT arrest with emergent PCI to mid LAD and recent PCI to mid LAD (05/2018),??Ischemic cardiomyopathy??with ICD [facility] Scientific),??CKD, paroxysmal afib, HTN,??HLD,??T2DM, and morbid obesity who presented to [facility] on 05/22/2018 after a syncopal event.    Of note the patient was discharged on 05/17/2018 after presenting with an NSTEMI that required a DES to the proximal to mid LAD. This was complicated by contrast induced nephropathy.The patient endorsed a URI like symptoms at the time of discharge but was otherwise feeling well. He was moving from his house and when his wife have him his blood pressure medications. He doesn't think that she cut the Losartan dose in half as instructed. An hour later when he went to stand up he was felt lightheaded and passed out. He reportedly hit his head EMS was called and his systolic blood pressure was noted to be in 90s. He presented to the [facility] ED and was given  500 cc of fluid. The patient was then transferred to [facility] for further management.    Upon arrival his vital signs were stable and labs were stable. Troponin and creatinine were .045 and 3.35 respectively, which were lower than during his admission earlier in the week. The patient had his device interrogated overnight which did not show any episodes. His blood pressure medication and diuretics were held overnight.     This morning, the patient denies any chest pain, dyspnea, or lower extremity swelling.    Cardiovascular Studies:  1. 05/12/2018 LHC: Coronary artery disease including diffuse moderate to severe lesions in the left coronary system, with particular emphasis on the 90% proximal to mid LAD in-stent restenosis.. Successful PCI to the proximal to mid LAD with the placement  of a Synergy drug eluting stent with excellent angiographic result and TIMI 3 flow.Subsequent impaired flow at an adjacent moderate diagonal, which was small and had pre-existing ostial disease. Recrossed and treated with balloon angioplasty with restored flow. Recent stent to RPDA/RPLA bifurcation widely patent.  2. 05/13/2018 TTE: Left ventricular hypertrophy - mild. Mildly decreased left ventricular systolic function, ejection fraction 40 to 45%. Segmental wall motion abnormality - (anteroseptal)  3. 05/23/2018 CIED Interrogation: No episodes noted    Pertinent Medical/Surgical History (reviewed in EMR):  ?? CAD c/b STEMI in 2018 complicated by cardiogenic shock following VT arrest with emergent PCI to mid LAD and recent PCI to mid LAD (05/2018),??  ?? Ischemic cardiomyopathy??with ICD [employer])  ?? CKD  ?? Paroxysmal afib  ?? HTN  ?? HLD   ?? T2DM  ?? Morbid obesity    Medications:    Current Facility-Administered Medications:   ???  acetaminophen (TYLENOL) tablet 650 mg, 650 mg, Oral, Q6H PRN, Rimma Osipov, MD  ???  albuterol (PROVENTIL HFA;VENTOLIN HFA) 90 mcg/actuation inhaler 2 puff, 2 puff, Inhalation, Q6H PRN, Rimma Osipov, MD  ???  allopurinoL (ZYLOPRIM) tablet 50 mg, 50 mg, Oral, Every Other Day, Rimma Osipov, MD, 50 mg at 05/23/18 0856  ???  aluminum-magnesium hydroxide-simethicone (MAALOX MAX) 80-80-8 mg/mL oral suspension, 30 mL, Oral, Q4H PRN, Zannie Cove, MD  ???  amiodarone (PACERONE) tablet 200 mg, 200 mg, Oral, Daily, Rimma Osipov, MD, 200 mg at 05/23/18 0856  ???  amLODIPine (NORVASC) tablet 10 mg, 10 mg, Oral, Daily, Rimma Osipov, MD, 10 mg at 05/23/18 0856  ???  atorvastatin (LIPITOR) tablet 80 mg, 80 mg, Oral, Daily, Rimma Osipov, MD, 80 mg at 05/23/18 0856  ???  calcium carbonate (TUMS) chewable tablet 400 mg of elem calcium, 400 mg of elem calcium, Oral, Daily PRN, Zannie Cove, MD  ???  carvediloL (COREG) tablet 25 mg, 25 mg, Oral, BID, Zannie Cove, MD, Stopped at 05/23/18 0858  ???  cholecalciferol (vitamin D3) tablet 1,000 Units, 1,000 Units, Oral, Daily, Rimma Osipov, MD, 1,000 Units at 05/23/18 0856  ???  dextrose (D10W) 10% bolus 125 mL, 12.5 g, Intravenous, Q30 Min PRN, Rimma Osipov, MD  ???  ezetimibe (ZETIA) tablet 10 mg, 10 mg, Oral, Nightly, Rimma Osipov, MD, 10 mg at 05/22/18 2247  ???  FLUoxetine (PROzac) capsule 60 mg, 60 mg, Oral, Nightly, Rimma Osipov, MD, 60 mg at 05/22/18 2245  ???  gabapentin (NEURONTIN) capsule 600 mg, 600 mg, Oral, Nightly, Rimma Osipov, MD, 600 mg at 05/22/18 2246  ???  guaiFENesin (ROBITUSSIN) oral syrup, 200 mg, Oral, Q4H PRN, Zannie Cove, MD  ???  insulin glargine (LANTUS) injection 40 Units, 40 Units, Subcutaneous, Nightly, Rimma Osipov, MD, Stopped at 05/23/18 0000  ???  insulin lispro (HumaLOG) injection 15 Units, 15 Units, Subcutaneous, 3xd Meals, Zannie Cove, MD, 15 Units at 05/23/18 1040  ???  melatonin tablet 9 mg, 9 mg, Oral, Nightly, Rimma Osipov, MD, 9 mg at 05/22/18 2246  ???  nitroglycerin (NITROSTAT) SL tablet 0.4 mg, 0.4 mg, Sublingual, Q5 Min PRN, Rimma Osipov, MD  ???  ondansetron (ZOFRAN-ODT) disintegrating tablet 8 mg, 8 mg, Oral, Q8H PRN **OR** ondansetron (ZOFRAN) injection 4 mg, 4 mg, Intravenous, Q8H PRN, Zannie Cove, MD  ???  polyethylene glycol (MIRALAX) packet 17 g, 17 g, Oral, Daily, Rimma Osipov, MD  ???  QUEtiapine (SEROquel) tablet 50 mg, 50 mg, Oral, BID PRN, Rimma Osipov, MD  ???  rivaroxaban (XARELTO) tablet 15 mg, 15 mg, Oral, Daily, Rimma  Mordecai Rasmussen, MD  ???  sevelamer (RENVELA) tablet 1,600 mg, 1,600 mg, Oral, 3xd Meals, Rimma Osipov, MD, 1,600 mg at 05/23/18 1040  ???  ticagrelor (BRILINTA) tablet 90 mg, 90 mg, Oral, BID, Rimma Osipov, MD, 90 mg at 05/23/18 0856  ???  traMADol (ULTRAM) tablet 50 mg, 50 mg, Oral, Q6H PRN, Zannie Cove, MD    Social History:  Social History     Tobacco Use   ??? Smoking status: Never Smoker   ??? Smokeless tobacco: Current User     Types: Chew   ??? Tobacco comment: Pt dips 1 can a week   Substance Use Topics   ??? Alcohol use: Never     Alcohol/week: 0.0 standard drinks     Frequency: Never     Comment: rare use: couple times a year, small amount   He reports no history of drug use.    Family History:  His family history includes Basal cell carcinoma in his mother; Blindness in his mother; Diabetes in his mother; Gallbladder disease in his half-brother; Heart disease in his father, half-sister, and mother; Hyperlipidemia in his father and mother; Kidney disease in his half-sister; Lung disease in his father; No Known Problems in his daughter and son; Obesity in his daughter, half-brother, and mother.    Review of Systems:  Review of ten systems is negative or unremarkable except as stated above.    Physical Exam:  VITAL SIGNS: BP 124/59  - Pulse 86  - Temp 36.7 ??C (Oral)  - Resp 20  - Ht 188 cm (6' 2)  - Wt (!) 133.5 kg (294 lb 5 oz)  - SpO2 97%  - BMI 37.79 kg/m??   GENERAL: No acute distress.  HEENT: Normocephalic. Conjunctivae and sclerae clear and anicteric. MMM.   NECK: Supple, without masses.   CARDIOVASCULAR: RRR. Normal S1 and S2. No m/r/g   RESPIRATORY: Normal respiratory effort without use of accessory muscles. Clear to auscultation bilaterally.  ABDOMEN: Soft, not tender or distended, with audible bowel sounds. No palpable organ enlargement or abnormal masses.  EXTREMITIES: Warm and well perfused. No edema  NEUROLOGIC: Appropriate mood and affect. Alert and oriented to person, place, and time. No gross motor or sensory deficits evident.    Other Pertinent Test Results:  Lab Results   Component Value Date    CKTOTAL 45.0 (L) 05/25/2015    CKTOTAL 44.0 (L) 08/14/2014    CKTOTAL 40 (L) 09/15/2012    CKMB 0.54 05/25/2015    CKMB 0.52 08/14/2014    CKMB <0.2 09/15/2012    TROPONINI 0.042 (HH) 05/23/2018    TROPONINI 0.045 (HH) 05/22/2018    TROPONINI 0.277 (HH) 05/14/2018     Lab Results   Component Value Date    CREATININE 3.42 (H) 05/23/2018     Lab Results   Component Value Date    PLT 345 05/23/2018     Lab Results   Component Value Date    INR 1.03 05/11/2018    INR 0.95 05/11/2018    INR 0.96 03/14/2018

## 2018-05-23 NOTE — Unmapped (Signed)
Assessment/Plan:     Principal Problem:    Syncope  Active Problems:    CAD (coronary artery disease) (RAF-[name])    Idiopathic chronic gout of left knee    Hypercholesteremia    Essential hypertension (RAF-[name])    Obesity, Class III, BMI 40-49.9 (morbid obesity) (CMS-[name])    Chronic nonalcoholic liver disease    GERD (gastroesophageal reflux disease)    Mild intermittent asthma without complication    Chronic pain syndrome    Paroxysmal atrial fibrillation (CMS-[name])    OSA (obstructive sleep apnea)    Tobacco use disorder    Status post cardiac pacemaker procedure    Type 2 diabetes mellitus with stage 4 chronic kidney disease, with long-term current use of insulin (CMS-[name])    Chronic heart failure with reduced ejection fraction and diastolic dysfunction (CMS-[name])    CKD (chronic kidney disease) stage 4, GFR 15-29 ml/min (CMS-[name])    Acquired left foot drop    Decreased sensation of lower extremity, left    Chronic anticoagulation  Resolved Problems:    * No resolved hospital problems. *      Jeremy Oliver is a 48 y.o. male with PMHx as noted below who presented to [facility] with syncope.    SYNCOPE: patient will undergo further evaluation and testing in the Observation Unit for complaints of syncope. The presentation of syncope is a past problem for this patient but with a recent recurrence.. Patient did not have Orthostati blood pressures checked in the ED.. Will admit for further evaluation and treatment to the Observation unit.      Syncope High-Risk Features  History: None  Past Medical History: Severe structural heart disease or CAD (heart failure, low EF, prior MI)  Physical Exam: Unexplained systolic blood pressure < 90 mmHg  ECG: None #     Syncopal Episode: He has has had 2, possibly 3 episodes today. Would suspect that his syncope is related to hypotension in the setting of over-diuresis and restarting losartan (possibly at a high dose than previously-- forgetting to cut the pill. He has not acute EKG changes and his troponin is likely residual from recent NSTEMI given CKD. He denies ICD discharges and reportedly interrogation at [facility] was negative for acute events. He did, however, have recurrent episodes after fluid ressuciation on the ambulance from [facility] to [facility] so cannot entirely exclude arythmia.   - Discussed patient with cardiology fellow, will plan for interrogation of device in the AM or sooner if he has recurrent episodes or concerning tele abnormalities   - Hold Bumex  - Hold losatan  - Holding hydralazine, can likely restart later in the AM  - continue Coreg, Amlodipine, Imdur  - Cardiac monitoring  - Orthostatics   - Will allow to eat and hydrate PO, will defer further IVF given HFrEF  - IV fluids as indicated for Orthostasis, with repeat orthostatic vitals in am.  - Has evidence of structural heart disease so will not obtain Echo.  - Syncope was not associated with chest pain or ischemic ECG changes - will not check cardiac enzymes  - Cards consult in AM as he is well known to their team    Elevated Troponin: Likely residual from recent NSTEMI given CKD  - Trend until down-trending     Chronic Medical Conditions:  AKI and CKD: Creatinine now back to baseline of around 3.   - Hold losartan and bumex as above  - Continue Ca, Sevelamer   - Trend creatinine  CAD w/ recent NSTEMI and Stent Placement: Continue ticagrelor, atorvastatin, zetia    DM2: Home 40 of lantus and 15 units lispro w/ meals  - Hold semaglutide     Paroxsymal Afib: Continue Coreg 25mg  BID, Continue Xareltto     Depression: Continue Prozac    Neuropathy: Home gabapentin dose    Gout: Continue allopurinol     FEN/GI: Heart Healthy diet    DVT prophylaxis: Pt already on theraputic anticoagulation    Code Status: Full Code    DISPOSITION LIST:  [ ]  Anticipated Discharge Location: Home  [ ]  PT/OT/DME: No needs anticipated  [ ]  CM/SW needs: None anticipated  [ ]  Meds/Rx:  Not yet prescribed. No special med needs  [ ]  Teaching: None anticipated  [ ]  Follow up appt: Appt needed  [ ]  Excuse letter: None anticipated  [ ]  Transport: Private Needed      Admitting Provider Signature:  Zannie Cove, MD  Night 1 Attending  Division of Hospital Medicine  May 22, 2018 10:38 PM    ______________________________________________________________________  Chief Complaint: syncope      History of Present Illness:     Jeremy Oliver is a 48 y.o. male with PMHx of CAD, STEMI (2018) complicated by cardiogenic shock following VT arrest with emergent PCI to mid LAD,??Ischemic cardiomyopathy??with ICD [facility]),??CKD, paroxysmal afib, HTN,??HLD,??T2DM, and morbid obesity who presented to [facility] with syncope.     Jeremy Oliver says he did not feel well this morning, having lightheadedness, while moving things in his home. His wife gave him his AM blood pressure medications around 9:30 AM and then he ate a sausage biscuit at around 10am. He continued to feel lightheaded and told his family to call paramedics around thirty minutes later. Following this, he was told he started slurring his words right before he fell out of his chair and hit his head. Per wife, he was unconscious for at least fifteen minutes and showed no jerking motions with his arms/legs. Paramedics arrived by the time he came to and he lost consciousness again as they were helping him up. He was then transported to [facility]. He says his ICD/pacer did not go off during this episode. He has experienced this feeling before and feels that it was his blood pressure. On arrival paramedics noted his BP to he in the 90s systolic. AT [facility] he had a 500 cc bolus but BP was noted to be with systolics in the 140s. No notation of hypoglycemia.  He had another episode in the ambulance while en route from [facility] to [facility]. He had been feeling lightheaded after getting onto a stretcher and was in the middle of talking when he lost consciousness. The last thing he remembers is being told that he did not look right. He has had no water today. He was sick after his stent placement one week ago but did not have nausea/vomiting with this episode. He has chills every once in a while. Denies fevers, shortness of breath, and abdominal pain. He says he has walked today with no problems. He can handle walking distances of 300 yards. Of note he wonders if he accidentally took a full rather than a 1/2 tab of losartan this AM as his wife sometimes forgets to cut it.     At [facility] EKG showed no acute ischemic changes. Of note, he was recently admitted to [facility] (2/11-2/18/2020) for workup of NSTEMI with peak troponin at 1.09 on 2/11. He had a LHC/PCl to the  proximal to mid LAD with the placement of a Synergy drug eluting stent on 2/13. His Losartan was held during this admission because of AKI but was restarted today, taking his first dose this morning. He has continued taking his bumex daily per his wife, who manages his medications.           Allergies:   Morphine      Medications:     Prior to Admission medications    Medication Dose, Route, Frequency   acetaminophen (TYLENOL) 500 MG tablet TAKE 2 TABLETS (1 000MG ) BY MOUTH EVERY 8 HOURS AS NEEDED FOR PAIN   albuterol HFA 90 mcg/actuation inhaler Inhalation   alirocumab (PRALUENT PEN) 75 mg/mL PnIj 75 mg, Subcutaneous, Every 14 days   allopurinol (ZYLOPRIM) 100 MG tablet 50 mg, Oral, Every other day   amiodarone (PACERONE) 200 MG tablet 200 mg, Oral   amLODIPine (NORVASC) 10 MG tablet 10 mg, Oral, Daily   atorvastatin (LIPITOR) 80 MG tablet 80 mg, Oral, Daily (standard)   blood sugar diagnostic Strp USE TO CHECK BLOOD SUGAR 4 TIMES DAILY (BEFORE MEALS AND NIGHTLY)   blood-glucose meter kit Use to check blood sugars, #1 meter, #100 strips. Freestyle glucose meter. ICD-9 250.00   blood-glucose sensor (FREESTYLE NAVIGATOR GLUC SENS) Devi Frequency:BID   Dosage:0.0     Instructions:  Note:Dose: 1   bumetanide (BUMEX) 2 MG tablet 2 mg, Oral   carvedilol (COREG) 25 MG tablet TAKE 1 TABLET BY MOUTH WITH THE MORNING MEAL AND TAKE 2 TABLETS WITH THE EVENING MEAL   cholecalciferol, vitamin D3, 5,000 unit capsule TAKE 1 CAPSULE (5 000 UNITS) BY MOUTH DAILY   diclofenac sodium (VOLTAREN) 1 % gel 2 g, Topical, 3 times a day PRN   empty container Misc Use as directed to dispose of injectable medications   ezetimibe (ZETIA) 10 mg tablet 10 mg, Oral   FLUoxetine (PROZAC) 20 MG capsule 60 mg, Oral, Nightly   gabapentin (NEURONTIN) 300 MG capsule 600 mg, Oral, Nightly   hydrALAZINE (APRESOLINE) 25 MG tablet 25 mg, Oral, 3 times a day (standard)   insulin ASPART (NOVOLOG FLEXPEN U-100 INSULIN) 100 unit/mL (3 mL) injection pen 15 Units, Subcutaneous, 3 times a day (with meals)   insulin detemir U-100 (LEVEMIR) 100 unit/mL (3 mL) injection pen 40 Units, Subcutaneous, Nightly   insulin syringe-needle U-100 0.3 mL 31 gauge x 5/16 Syrg USE TO INJECT INSULIN TWICE DAILY WITH MEALS   isosorbide mononitrate (IMDUR) 30 MG 24 hr tablet 30 mg, Oral, Daily (standard)   lancets 30 gauge Misc USE TO CHECK BLOOD SUGAR THREE TIMES DAILY WITH MEALS   losartan (COZAAR) 50 MG tablet 50 mg, Oral, Daily (standard)   melatonin 3 mg cap 9 mg, Oral, Nightly   nitroglycerin (NITROSTAT) 0.4 MG SL tablet 0.4 mg, Sublingual, Every 5 min PRN   QUEtiapine (SEROQUEL) 50 MG tablet 50 mg, Oral, 2 times a day PRN   rivaroxaban (XARELTO) 15 mg Tab 15 mg, Oral, Daily   semaglutide 0.25 mg or 0.5 mg(2 mg/1.5 mL) PnIj 0.25 mg, Subcutaneous, Every 7 days, For 4 weeks then increase to 0.5mg  every 7 days   sevelamer (RENVELA) 800 mg tablet TAKE 2 TABLETS (1600MG ) BY MOUTH THREE TIMES DAILY WITH MEALS   ticagrelor (BRILINTA) 90 mg Tab 90 mg, Oral   traMADol (ULTRAM) 50 mg tablet 50 mg, Oral, Every 6 hours PRN   UNIFINE PENTIPS 31 gauge x 5/16 Ndle USE WITH INSULIN AND VICTOZA 5 TIMES DAILY   aspirin (  ECOTRIN) 81 MG tablet 81 mg, Oral, Daily (standard)   liraglutide (VICTOZA 3-PAK) 0.6 mg/0.1 mL (18 mg/3 mL) injection 1.8 mg, Subcutaneous, Daily (standard)   metFORMIN (GLUCOPHAGE) 500 MG tablet 1,000 mg, Oral, 2 times a day with meals         Past Medical History:     Past Medical History:   Diagnosis Date   ??? Angina at rest (CMS-[name])    ??? Arthritis    ??? Asthma    ??? Atrial fibrillation and flutter (CMS-[name]) 06/08/14   ??? BMI 40.0-44.9, adult (CMS-[name]) 10/29/2015   ??? Chronic anticoagulation 02/17/2018   ??? Chronic kidney disease, stage 3 (CMS-[name])    ??? Chronic pain syndrome 04/11/2014    ~Use daily lyrica as recommended ~Tramadol as needed for pain ~Fluoxetine daily as recommended ~Daily exercise ~Eat a healthy diet ~Good control of your blood sugars can help    ??? Coronary artery disease    ??? Depression    ??? Diabetes mellitus (CMS-[name])    ??? Eye trauma 1998    glass in both eyes and removed   ??? GERD (gastroesophageal reflux disease)    ??? Gout    ??? Homeless    ??? Hyperlipidemia    ??? Hypertension    ??? Illiteracy and low-level literacy    ??? Microscopic hematuria    ??? Migraine    ??? Nephrolithiasis    ??? 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 Nonalcoholic fatty liver disease    ??? NSTEMI (non-ST elevated myocardial infarction) (CMS-[name]) 01/13/2017   ??? Obesity    ??? Obstructive sleep apnea 2009   ??? Panic attacks    ??? 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 Seizure-like activity (CMS-[name])     ~Monitor for symptoms and report them to your primary care provider if they occur    ??? Systolic CHF, chronic (CMS-[name])     EF 40-45% 2017   ??? TIA (transient ischemic attack)          Past Surgical History:      Past Surgical History:   Procedure Laterality Date   ??? CARDIAC CATHETERIZATION  03/08/2007   ??? CYSTOURETHROSCOPY  12/31/2009     Microscopic hematuria   ??? KNEE SURGERY Right 09/23/2006    arthroscopic knee surgery medial and lateral meniscus tears as well as crystal disease   ??? PR CATH PLACE/CORON ANGIO, IMG SUPER/INTERP,R&L HRT CATH, L HRT VENTRIC N/A 02/13/2017    Procedure: Left/Right Heart Catheterization;  Surgeon: Marlaine Hind, MD;  Location: [facility] CATH;  Service: Cardiology   ??? PR CATH PLACE/CORON ANGIO, IMG SUPER/INTERP,W LEFT HEART VENTRICULOGRAPHY N/A 03/17/2017    Procedure: Left Heart Catheterization W Intervention;  Surgeon: Marlaine Hind, MD;  Location: [facility] CATH;  Service: Cardiology   ??? PR CATH PLACE/CORON ANGIO, IMG SUPER/INTERP,W LEFT HEART VENTRICULOGRAPHY N/A 05/12/2018    Procedure: CATH LEFT HEART CATHETERIZATION W INTERVENTION;  Surgeon: Dorathy Kinsman, MD;  Location: [facility] CATH;  Service: Cardiology   ??? PR ECMO/ECLS INITIATION VENO-ARTERIAL N/A 03/19/2017    Procedure: ECMO/ECLS; INITIATION, VENO-ARTERIAL;  Surgeon: Lennie Odor, MD;  Location: MAIN OR [facility];  Service: Cardiac Surgery   ??? PR ECMO/ECLS RMVL PRPH CANNULA OPEN 6 YRS & OLDER Left 03/25/2017    Procedure: ECMO / ECLS PROVIDED BY PHYSICIAN; REMOVAL OF PERIPHERAL (ARTERIAL AND/OR VENOUS) CANNULA(E), OPEN, 6 YEARS AND OLDER;  Surgeon: Alonna Buckler Ikonomidis, MD;  Location: MAIN OR [facility];  Service: Cardiac Surgery   ??? PR EPHYS EVAL W/ ABLATION SUPRAVENT ARRHYTHMIA N/A 07/19/2015    Catheter ablation of cavotricuspid isthmus for atrial flutter [facility])   ??? PR INSER HART PACER XVENOUS ATRIAL N/A 05/30/2015    [employer] dual-chamber pacemaker implant (E.Chung)   ??? PR INSERT/PLACE FLOW DIRECT CATH N/A 03/17/2017    Procedure: Insert Leave In [place];  Surgeon: Marlaine Hind, MD;  Location: [facility] CATH;  Service: Cardiology   ??? PR INSJ/RPLCMT PERM DFB W/TRNSVNS LDS 1/DUAL CHMBR N/A 05/04/2017    Procedure: ICD Implant System (Single/Dual);  Surgeon: Eldred Manges, MD;  Location: [facility] CATH;  Service: Cardiology   ??? PR NEGATIVE PRESSURE WOUND THERAPY DME >50 SQ CM Left 03/25/2017    Procedure: Neg Press Wound Tx (Vac Assist) Incl Topicals, Per Session, Tsa Greater Than/= 50 Cm Squared;  Surgeon: Alonna Buckler Ikonomidis, MD;  Location: MAIN OR [facility];  Service: Cardiac Surgery   ??? PR PRQ TRLUML CORONARY STENT W/ANGIO ONE ART/BRNCH N/A 03/12/2018    Procedure: Percutaneous Coronary Intervention;  Surgeon: Marlaine Hind, MD;  Location: [facility] CATH;  Service: Cardiology   ??? PR REBL VES DIRECT,LOW EXTREM Left 03/19/2017    Procedure: Repr Bld Vessel Direct; Lower Extrem;  Surgeon: Boykin Reaper, MD;  Location: MAIN OR [facility];  Service: Vascular   ??? PR REMV ART CLOT ILIAC-POP,LEG INCIS Left 03/25/2017    Procedure: EMBOLECTOMY OR THROMBECTOMY, WITH OR WITHOUT CATHETER; FEMOROPOPLITEAL, AORTOILIAC ARTERY, BY LEG INCISION;  Surgeon: Alonna Buckler Ikonomidis, MD;  Location: MAIN OR [facility];  Service: Cardiac Surgery   ??? PR UPPER GI ENDOSCOPY,BIOPSY N/A 02/28/2016    Procedure: UGI ENDOSCOPY; WITH BIOPSY, SINGLE OR MULTIPLE;  Surgeon: Liane Comber, MD;  [facility]: [facility];  Service: Gastroenterology   ??? PR UPPER GI ENDOSCOPY,DIAGNOSIS N/A 05/07/2016    Procedure: UGI ENDO, INCLUDE ESOPHAGUS, STOMACH, & DUODENUM &/OR JEJUNUM; DX W/WO COLLECTION SPECIMN, BY BRUSH OR WASH;  Surgeon: Modena Nunnery, MD;  Location: [facility];  Service: Gastroenterology         Social History:     Social History     Socioeconomic History   ??? Marital status: Married     Spouse name: [name]   ??? Number of children: 3   ??? Years of education: 44   ??? Highest education level: Not on file   Occupational History   ??? Occupation: DISABLED     Comment: Former Surveyor, quantity Needs   ??? Financial resource strain: Very hard   ??? Food insecurity     Worry: Not on file     Inability: Not on file   ??? Transportation needs     Medical: Not on file Non-medical: Not on file   Tobacco Use   ??? Smoking status: Never Smoker   ??? Smokeless tobacco: Current User     Types: Chew   ??? Tobacco comment: Pt dips 1 can a week   Substance and Sexual Activity   ??? Alcohol use: Never     Alcohol/week: 0.0 standard drinks     Frequency: Never  Comment: rare use: couple times a year, small amount   ??? Drug use: No   ??? Sexual activity: Yes     Partners: Female   Lifestyle   ??? Physical activity     Days per week: Not on file     Minutes per session: Not on file   ??? Stress: Not on file   Relationships   ??? Social Wellsite geologist on phone: More than three times a week     Gets together: More than three times a week     Attends religious service: 1 to 4 times per year     Active member of club or organization: No     Attends meetings of clubs or organizations: Never     Relationship status: Married   Other Topics Concern   ??? Do you use sunscreen? No   ??? Tanning bed use? No   ??? Are you easily burned? No   ??? Excessive sun exposure? No   ??? Blistering sunburns? No   Social History Narrative    Information updated:  01/08/16. Reviewed 08/12/17:        Born in [place]    Raised with both parents    Parents died w/in 6 mos of each other. His parents knew they were going to pass and told him just before.    Half sister and 2 half brothers        Married 30 yrs. Wife [name]    2 other children son and daughter, adopted out    Daughter Bridgette, bf DJ        Education - completed through 11th. 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    Former Presenter, broadcasting since age 17    Lives with wife, difficulty paying bills 12/30/17     Lives next door to daughter, husband and their grandson         05/19/18: pt moving to new home in next 5 days- after an eviction. Has Medicaid assistance again. Will be w/ wife [name], they babysit their grandson often.         Family History:     Family History Problem Relation Age of Onset   ??? Diabetes Mother    ??? Hyperlipidemia Mother    ??? Heart disease Mother    ??? Basal cell carcinoma Mother    ??? Blindness Mother         diabeties   ??? Obesity Mother    ??? Hyperlipidemia Father    ??? Heart disease Father    ??? Lung disease Father    ??? Heart disease Half-Sister    ??? Kidney disease Half-Sister    ??? Gallbladder disease Half-Brother    ??? Obesity Half-Brother    ??? No Known Problems Daughter    ??? No Known Problems Son    ??? Obesity Daughter    ??? Melanoma Neg Hx    ??? Squamous cell carcinoma Neg Hx    ??? Macular degeneration Neg Hx    ??? Glaucoma Neg Hx          Review of Systems:   10 systems reviewed and are negative unless otherwise mentioned in HPI      Physical Exam:   Temp:  [37.1 ??C (98.7 ??F)]  37.1 ??C (98.7 ??F)  Heart Rate:  [71-90] 90  Resp:  [18] 18  BP: (119-144)/(62-81) 130/68  SpO2:  [96 %] 96 %, BP 130/68 Comment: Standing - Pulse 90  - Temp 37.1 ??C (98.7 ??F) (Oral)  - Resp 18  - Ht 188 cm (6' 2)  - Wt (!) 133.5 kg (294 lb 5 oz)  - SpO2 96%  - BMI 37.79 kg/m??     General: No acute distress, alert, oriented and communicative  HEENT: PEERL, EOMI, oropharynx without lesions or exudates, to cervical or supraclavicular LAD, no apparent elevated JVD  Respiratory: Lungs CTAB, no wheezes, rales or rhonchi, chest expansion symmetric, well healed sternotomy  Cardiovascular: Regular rate and rhythm, no murmurs, rubs or gallops, JVD not elevated, extremities warm  Gastrointestinal: Bowel sounds present, Abdomen non-tender to palpation in all quadrants, no rebound or guarding, no obvious organomegally  Extremities: No LE edema, distal pulses palpable, L foot drop  MSK: No joint or limb deformities, no muscle tenderness to palpation  Skin: No diaphoresis, small wound with broken toenail on L 3rd toe  Neuro: Alert and oriented, no facial droop, moving all extremities symmetrically          Labs/Studies/Diagnostics:   Labs and Studies from the last 24hrs per EMR and Reviewed ____________________________________________________    Scribe's Attestation: Zannie Cove, MD obtained and performed the history, physical exam and medical decision making elements that were  entered into the chart. Documentation assistance was provided by me personally, a scribe. Signed by Vidal Schwalbe, scribe, on May 22, 2018 at 10:38 PM.       I personally performed the history, physical exam, and medical decision making.  The portions of the above document transcribed by the scribe were the History of Present Illness, Allergies, Medications, Medical History, Surgical History, Social History, and Review of Systems; I have reviewed and edited these for accuracy.  The other portions including the Physical Exam and Assessment/Plan were dictated by me.     Zannie Cove, MD  11:47 PM

## 2018-05-23 NOTE — Unmapped (Signed)
Pt updated regarding care plan. Head to toe assessment completed.  Medications administered per [facility] order.  Call bell within reach, bed in low position, wheels locked, alarm on.  Vitals unremarkable, no signs of skin breakdown.  No other needs/issues noted.  Will continue to monitor.     Problem: Adult Inpatient Plan of Care  Goal: Plan of Care Review  Outcome: Progressing  Goal: Patient-Specific Goal (Individualization)  Outcome: Progressing  Flowsheets (Taken 05/23/2018 0502)  Individualized Care Needs: Improve symptoms by 2/24  Goal: Absence of Hospital-Acquired Illness or Injury  Outcome: Progressing  Goal: Optimal Comfort and Wellbeing  Outcome: Progressing  Goal: Readiness for Transition of Care  Outcome: Progressing  Goal: Rounds/Family Conference  Outcome: Progressing     Problem: Wound  Goal: Optimal Wound Healing  Outcome: Progressing     Problem: Fall Injury Risk  Goal: Absence of Fall and Fall-Related Injury  Outcome: Progressing     Problem: Cardiac Output Decreased (Heart Failure)  Goal: Optimal Cardiac Output  Outcome: Progressing     Problem: Fluid Imbalance (Heart Failure)  Goal: Fluid Balance  Outcome: Progressing     Problem: Sleep Disordered Breathing (Heart Failure)  Goal: Effective Breathing Pattern During Sleep  Outcome: Progressing

## 2018-05-23 NOTE — Unmapped (Signed)
Jeremy Oliver is a 48 y.o. male with PMHx of CAD, STEMI (2018) complicated by cardiogenic shock following VT arrest with emergent PCI to mid LAD,??Ischemic cardiomyopathy??with ICD [facility]),??CKD, paroxysmal afib, HTN,??HLD,??T2DM, and morbid obesity who presented to [facility] with syncope.     Syncopal Episodes: He had 2, possibly 3 episodes on 2/22. Brought to [facility], where EKG showed no acute ischemic changes. Of note, he was recently admitted to [facility] (2/11-2/18/2020) for workup of NSTEMI with peak troponin at 1.09 on 2/11. He had a LHC/PCl to the proximal to mid LAD with the placement of a Synergy drug eluting stent on 2/13. His Losartan was held during this admission because of AKI but was restarted 2/22, taking his first dose prior to syncopizing. He has continued taking his bumex daily per his wife, who manages his medications. We suspected that his syncope was related to hypotension in the setting of over-diuresis and restarting losartan (possibly at a high dose than previously-- with his wife possibly forgetting to cut the pill.) He has not acute EKG changes and his troponin is likely residual from recent NSTEMI given CKD. He denies ICD discharges and ICD was interrogated with no events. ***    CAD w/ recent NSTEMI and Stent Placement: Continued ticagrelor, atorvastatin, zetia.    CKD: His creatinine was at his baseline of around 3 on admission. Continued home Ca and Sevelamer.  ??  DM2: Home 40 of lantus and 15 units lispro w/ meals. Held semaglutide while inpatient.  ??  Paroxsymal Afib: Continued Coreg 25mg  BID***. Continued Xareltto.  ??  Depression: Continued Prozac.  ??  Neuropathy: Continued gabapentin.  ??  Gout: Continued allopurinol.

## 2018-05-25 NOTE — Unmapped (Signed)
[facility] Clinical Assessment & Refill Coordination Note    Jeremy Oliver, DOB: Mar 22, 1971  Phone: [phone] (home)     All above HIPAA information was verified with patient.     Specialty Medication(s):   General Specialty: Praluent     Current Outpatient Medications   Medication Sig Dispense Refill   ??? acetaminophen (TYLENOL) 500 MG tablet TAKE 2 TABLETS (1 000MG ) BY MOUTH EVERY 8 HOURS AS NEEDED FOR PAIN 180 each 3   ??? albuterol HFA 90 mcg/actuation inhaler Inhale 2 puffs into the lungs every 6 (six) hours as needed for wheezing or shortness of breath. 18 g 2   ??? alirocumab (PRALUENT PEN) 75 mg/mL PnIj INJECT THE CONTENTS OF 1 PEN (75 MG) UNDER THE SKIN EVERY 14 DAYS 2 mL 11   ??? allopurinol (ZYLOPRIM) 100 MG tablet Take 0.5 tablets (50 mg total) by mouth every other day. 30 tablet 11   ??? amiodarone (PACERONE) 200 MG tablet TAKE 1 TABLET BY MOUTH ONCE DAILY 90 tablet 3   ??? amLODIPine (NORVASC) 10 MG tablet Take 0.5 tablets (5 mg total) by mouth daily. 90 tablet PRN   ??? atorvastatin (LIPITOR) 80 MG tablet Take 1 tablet (80 mg total) by mouth daily. 90 tablet 3   ??? blood sugar diagnostic Strp USE TO CHECK BLOOD SUGAR 4 TIMES DAILY (BEFORE MEALS AND NIGHTLY) 100 each 3   ??? blood-glucose meter kit Use to check blood sugars, #1 meter, #100 strips. Freestyle glucose meter. ICD-9 250.00 1 each 0   ??? blood-glucose sensor (FREESTYLE NAVIGATOR GLUC SENS) Devi Frequency:BID   Dosage:0.0     Instructions:  Note:Dose: 1     ??? bumetanide (BUMEX) 2 MG tablet TAKE 1 TABLET BY MOUTH ONCE DAILY 90 tablet 3   ??? carvedilol (COREG) 25 MG tablet TAKE 1 TABLET BY MOUTH WITH THE MORNING MEAL AND TAKE 2 TABLETS WITH THE EVENING MEAL 270 tablet 3   ??? cholecalciferol, vitamin D3, 5,000 unit capsule Take 1 capsule (5,000 Units total) by mouth daily. 100 capsule 0   ??? diclofenac sodium (VOLTAREN) 1 % gel Apply 2 g topically Three (3) times a day as needed for pain.     ??? empty container Misc Use as directed to dispose of injectable medications 1 each 3   ??? ezetimibe (ZETIA) 10 mg tablet TAKE 1 TABLET BY MOUTH ONCE DAILY 90 each 3   ??? FLUoxetine (PROZAC) 20 MG capsule Take 60 mg by mouth nightly.     ??? gabapentin (NEURONTIN) 300 MG capsule Take 2 capsules (600 mg total) by mouth nightly. 180 capsule 3   ??? hydrALAZINE (APRESOLINE) 25 MG tablet Take 1 tablet (25 mg total) by mouth Three (3) times a day. 270 tablet 3   ??? insulin ASPART (NOVOLOG FLEXPEN U-100 INSULIN) 100 unit/mL (3 mL) injection pen Inject 0.15 mL (15 Units total) under the skin Three (3) times a day with a meal. 15 mL 11   ??? insulin detemir U-100 (LEVEMIR) 100 unit/mL (3 mL) injection pen Inject 40 Units under the skin nightly.     ??? insulin syringe-needle U-100 0.3 mL 31 gauge x 5/16 Syrg Use to inject insulin twice daily with meals 60 each 0   ??? isosorbide mononitrate (IMDUR) 30 MG 24 hr tablet Take 1 tablet (30 mg total) by mouth daily. 90 tablet 3   ??? lancets 30 gauge Misc USE TO CHECK BLOOD SUGAR THREE TIMES DAILY WITH MEALS 250 each 2   ???  losartan (COZAAR) 50 MG tablet Take 1 tablet (50 mg total) by mouth daily. 90 tablet 3   ??? melatonin 3 mg cap Take 9 mg by mouth nightly.     ??? nitroglycerin (NITROSTAT) 0.4 MG SL tablet Place 1 tablet (0.4 mg total) under the tongue every five (5) minutes as needed for chest pain. 100 each 3   ??? QUEtiapine (SEROQUEL) 50 MG tablet Take 1 tablet (50 mg total) by mouth two (2) times a day as needed (headache). 180 tablet 3   ??? rivaroxaban (XARELTO) 15 mg Tab Take 1 tablet (15 mg total) by mouth daily with evening meal. 90 tablet 3   ??? semaglutide 0.25 mg or 0.5 mg(2 mg/1.5 mL) PnIj Inject 0.25 mg under the skin every seven (7) days. For 4 weeks then increase to 0.5mg  every 7 days 7.5 mL 0   ??? sevelamer (RENVELA) 800 mg tablet TAKE 2 TABLETS (1600MG ) BY MOUTH THREE TIMES DAILY WITH MEALS 540 tablet 3   ??? ticagrelor (BRILINTA) 90 mg Tab TAKE 1 TABLET BY MOUTH TWICE DAILY 180 tablet 3   ??? traMADol (ULTRAM) 50 mg tablet Take 1 tablet (50 mg total) by mouth every six (6) hours as needed. 30 tablet 0   ??? UNIFINE PENTIPS 31 gauge x 5/16 Ndle USE WITH INSULIN AND VICTOZA 5 TIMES DAILY 400 each 3     No current facility-administered medications for this visit.         Changes to medications: Jeremy Oliver reports no changes reported at this time.    Allergies   Allergen Reactions   ??? Morphine Palpitations     Medication error: was administered 20mL instead of 2mL       Changes to allergies: No    SPECIALTY MEDICATION ADHERENCE     Praluent 75 mg/ml: 14 days of medicine on hand       Medication Adherence    Patient reported X missed doses in the last month:  0  Specialty Medication:  Praluent  Patient is on additional specialty medications:  No          Specialty medication(s) dose(s) confirmed: Regimen is correct and unchanged.     Are there any concerns with adherence? No    Adherence counseling provided? Not needed    CLINICAL MANAGEMENT AND INTERVENTION      Clinical Benefit Assessment:    Do you feel the medicine is effective or helping your condition? Unable to determine effectiveness yet    Clinical Benefit counseling provided? Not needed    Adverse Effects Assessment:    Are you experiencing any side effects? No    Are you experiencing difficulty administering your medicine? No    Quality of Life Assessment:    How many days over the past month did your hyperlipidemia  keep you from your normal activities? For example, brushing your teeth or getting up in the morning. 0    Have you discussed this with your provider? Not needed    Therapy Appropriateness:    Is therapy appropriate? Yes, therapy is appropriate and should be continued    DISEASE/MEDICATION-SPECIFIC INFORMATION      For patients on injectable medications: Patient currently has 1 doses left.  Next injection is scheduled for 05/25/18.    PATIENT SPECIFIC NEEDS     ? Does the patient have any physical, cognitive, or cultural barriers? No    ? Is the patient high risk? No     ? Does the patient require a  Care Management Plan? No     ? Does the patient require physician intervention or other additional services (i.e. nutrition, smoking cessation, social work)? No      SHIPPING     Specialty Medication(s) to be Shipped:   General Specialty: Praluent    Other medication(s) to be shipped: none     Changes to insurance: Yes: Pt approved for Medicaid    Delivery Scheduled: Yes, Expected medication delivery date: 05/27/18.     Medication will be delivered via Same Day Courier to the confirmed prescription address in [facility].    The patient will receive a drug information handout for each medication shipped and additional FDA Medication Guides as required.  Verified that patient has previously received a Conservation officer, historic buildings.    Arnold Long   [facility] Specialty Pharmacist

## 2018-05-27 MED FILL — PRALUENT PEN 75 MG/ML SUBCUTANEOUS PEN INJECTOR: SUBCUTANEOUS | 28 days supply | Qty: 2 | Fill #1

## 2018-05-27 MED FILL — PRALUENT PEN 75 MG/ML SUBCUTANEOUS PEN INJECTOR: 28 days supply | Qty: 2 | Fill #1 | Status: AC

## 2018-05-27 NOTE — Unmapped (Signed)
Confirmed:  The Hospital Follow-Up Enhanced Care Team spoke to Jeremy Oliver on May 27, 2018 to remind the patient of their upcoming appointment with the Hospital Follow-Up team on 05/28/2018. The patient confirmed they will be attending their follow up appointment and confirmed having transportation to the visit. Pt was encouraged to bring all of their medications to the visit.

## 2018-05-27 NOTE — Unmapped (Signed)
Internal Medicine Hospital Follow-Up Visit    ASSESSMENT / PLAN:  Things to follow-up at next visit:   1. Does prilosec help dry heaving symptoms?  2. Follow-up regarding PFTs results  3. As discussed with Dr. Hulan Fess, [place] want to consider xarelto trough    1. Syncope, unspecified syncope type    2. Hospital discharge follow-up    3. Type 2 diabetes mellitus with stage 4 chronic kidney disease, with long-term current use of insulin (CMS-[name])    4. Chronic heart failure with reduced ejection fraction and diastolic dysfunction (CMS-[name])    5. Essential hypertension (RAF-[name])    6. Gastroesophageal reflux disease, esophagitis presence not specified    7. Coronary artery disease involving native coronary artery of native heart without angina pectoris    8. Paroxysmal atrial fibrillation (CMS-[name])    9. On amiodarone therapy         Hospital FU for syncope  -No further symptoms since decreasing amlodipine from 10mg  to 5 mg however his wife stopped it completely due to concern for another syncope episode  -Blood pressure slightly up today likely due to stopped amlodpine, would re-address at next visit, as we recommended he take 5 mg daily    GERD  -Will start prilosec 20 mg once daily and see if this helps with his GI symptoms  -Could also be gallbladder issue, although he's not a surgical candidate  -Also consider diabetic gastroparesis if no improvment    -Treatment with amiodarone for Atrial Fibrillation  -spirometry ordered as he has not had PFTs while on amiodarone, and does have a complaint of occasional cough and shortness of breath    -Xarelto for atrial fibrillation  -discussed with Dr. Hulan Fess that we Dejaynes want to take a trough level, however there is some uncertainty as to whether he's actually taking it    CKD with Hyperphosphatemia  -prescribed medication for this but has not been taking    HFrEF  -Weight stable today, and no evidence of fluid overload, continue current treatment    CAD  -not a surgical candidate, on medical management    Medication adjustments:   1. Start prilosec 20 mg once daily    Patient provided with an updated and reconciled medications list.    Follow Up: Return for schedule nurse visit for spirometry same day as Dr. Merri Brunette visit.    Future Appointments   Date Time Provider Department Center   06/07/2018  3:00 PM [place] Max, CPP [facility]   06/22/2018 10:00 AM Melvyn Novas, MD [facility]   06/29/2018 11:45 AM Brittain Quincy Simmonds, PA [facility]   06/29/2018  1:00 PM MEDMED SPIROMETRY [facility]   06/29/2018  2:00 PM Roma Schanz, MD [facility]   06/29/2018  3:00 PM Diane R Dolan-Soto, LCSW [facility]   07/20/2018  9:30 AM Mila Merry, MD [facility]   07/22/2018  3:50 PM Marlaine Hind, MD [facility]   07/27/2018 12:30 AM [place] EP REMOTE MONITORING [facility]PM[facility]OR[facility]       Medication adherence and barriers to the treatment plan have been addressed. Opportunities to optimize healthy behaviors have been discussed. Patient / caregiver voiced understanding.      History obtained from: Patient       CHIEF COMPLAINT/HISTORY OF PRESENT ILLNESS:   Hospital Follow-Up for Hospitalization Follow-up    Interval update:  Since hospital discharge, Mr. Babe is complaining of dry  heaves reports that whenever he goes either from inside to outside or the other way.  He's had this a long time, like maybe a year, usually about once a day, but now 2-3 times a day.  He does not take any medicine for it.  He also has some SOB with these episodes.  He has not had any further passing out spells.  He reports that he still has his gallbladder.  He does have some reflux when eating greasy foods.      Previously he took prozac, but in discussing his depression symptoms with him today, he does not feel he needs it.  He reports that he mainly need it after he was in a coma and was recovering.      He says his mouth is dry all the time and he drinks lots of water.    See note from SW, as he and his wife were evicted while he was in the hospital, and they are currently living with their daughter.      Chills, swelling in feed, SOB that awkens, pain and tightness in chest, cough and wheezing, nausea, vomiting, dry mouth    Date of Hospitalization Discharge:  05/23/18     Reviewed: Discharge summary and Labs; See results in Epic  Discussed care with: Social worker and Forensic psychologist by phone or other method (Encounter type: Patient Outreach):  Patient Outreach History (Since 05/14/2018)     Cardiology Clinical Assessment     Date Method of Outreach Associated Actions User Next Outreach    05/25/2018  9:16 AM Telephone  Arnold Long, PharmD 10/23/2018    05/17/2018 12:38 PM Telephone  Arnold Long, PharmD 05/24/2018          Cardiology Refill Coordination     Date Method of Outreach Associated Actions User Next Outreach    05/25/2018  9:15 AM Telephone  Arnold Long, PharmD 06/17/2018          Transition of Care     Date Method of Outreach Associated Actions User Next Outreach    05/19/2018 10:14 AM Telephone  Debbe Odea, RN                No contact made or no documentation of 2 attempts within 2 business days    Summary of hospitalization:   Shawn Route Mickley is a 48 y.o. male with PMHx of??CAD, STEMI (2018) complicated by cardiogenic shock following VT arrest with emergent PCI to mid LAD,??Ischemic cardiomyopathy??with ICD [facility]),??CKD, paroxysmal afib, HTN,??HLD,??T2DM, and morbid obesity??who presented to [facility] with syncope.??who was observed overnight. Symptoms thought due to medications, and therefore amlodipine was decreased.    Admit Date: 05/22/2018  ??  Discharge Date: 05/23/2018   ??  Discharge To: Home  ??  Discharge Service: Observation 1   ??  Discharge Attending Physician: Ledell Peoples, MD  ??  Discharge Diagnoses:  Principal Problem:    Syncope POA: Unknown  Active Problems:    CAD (coronary artery disease) (RAF-[name]) POA: Yes    Idiopathic chronic gout of left knee POA: Yes    Hypercholesteremia POA: Yes    Essential hypertension (RAF-[name]) POA: Yes    Obesity, Class III, BMI 40-49.9 (morbid obesity) (CMS-[name]) POA: Yes    Chronic nonalcoholic liver disease POA: Yes    GERD (gastroesophageal reflux disease) POA: Yes    Mild intermittent asthma without complication POA: Yes    Chronic pain syndrome POA: Yes    Paroxysmal  atrial fibrillation (CMS-[name]) POA: Yes    OSA (obstructive sleep apnea) POA: Yes    Tobacco use disorder POA: Yes    Status post cardiac pacemaker procedure POA: Yes    Type 2 diabetes mellitus with stage 4 chronic kidney disease, with long-term current use of insulin (CMS-[name]) POA: Not Applicable    Chronic heart failure with reduced ejection fraction and diastolic dysfunction (CMS-[name]) POA: Yes    CKD (chronic kidney disease) stage 4, GFR 15-29 ml/min (CMS-[name]) POA: Yes    Acquired left foot drop POA: Yes    Decreased sensation of lower extremity, left POA: Yes    Chronic anticoagulation POA: Not Applicable  Resolved Problems:    * No resolved hospital problems. *  ??  ??  Outpatient Provider Follow Up Issues:   - Monitoring BP as it appears this syncopal event was 2/2 hypotension. Decreased amlodipine to 5mg  daily.  - Medication reconciliation after discharge to ensure he is taking what he is supposed to.  ??  Hospital Course:   Shawn Route Acrey??is a 48 y.o.??male??with PMHx of??CAD, STEMI (2018) complicated by cardiogenic shock following VT arrest with emergent PCI to mid LAD,??Ischemic cardiomyopathy??with ICD [facility]),??CKD, paroxysmal afib, HTN,??HLD,??T2DM, and morbid obesity??who presented to [facility] with syncope.??  ??  Syncopal Episodes: He had 2, possibly 3 episodes on 2/22. Brought to [facility], where EKG showed no acute ischemic changes. Of note, he was recently admitted to [facility] (2/11-2/18/2020) for workup of NSTEMI with peak troponin at 1.09 on 2/11. He had a LHC/PCl to the proximal to mid LAD with the placement of a Synergy drug eluting stent on 2/13. His Losartan was held during this admission because of AKI??but was restarted 2/22, taking his first dose prior to syncopizing. He has continued taking his bumex daily per his wife, who manages his medications.?? The patient told us he was exerting himself when he passed out and that he had taken a higher dose of losartan than prescribed because his wife Bayron have forgotten to cut his pill in half. We presumed this was the etiology of his syncope - hypotension in setting of too high dose of anti-hypertensive and exerting with dehydration. We checked orthostatics here which were negative. He has not acute EKG changes and his troponin is likely residual from recent NSTEMI given CKD, but then it downtrended. He denies ICD discharges and ICD was interrogated with no events. Cardiology evaluated him who reported the same findings and felt he required no further inpatient workup. We called his wife who reported that he was taking all his doses exactly as prescribed and he did not take more losartan than should have because it is not a pill that is cut. She also said he wasn't feeling well all day and was not doing any exertion when this event occurred. It still seems like the etiology of this event was too many anti-hypertensives, so we d/w cardiology and decided to continue losartan as this is beneficial for his heart failure. We instructed the patient and his wife to decrease his amlodipine from 10mg  daily to 5mg  daily and to keep a BP log. If he starts having lower BP's he will stop the amlodipine and call his cardiologist. He was discharged with a thorough medication reconciliation and was given a referral for [facility] Follow up.  ??  CAD w/ recent NSTEMI and Stent Placement: Continued ticagrelor, atorvastatin, zetia.  ??  CKD: His creatinine was at his baseline of around 3 on admission. Continued home Ca  and Sevelamer.  ??  DM2: Home 40 of lantus and 15 units lispro w/ meals. Held semaglutide while inpatient, but resumed on discharge.  ??  Paroxsymal Afib: Continued Coreg 25mg  qAM and 50mg  qHS. Continued Xareltto.  ??  Depression: Continued Prozac.  ??  Neuropathy: Continued gabapentin.  ??  Gout: Continued allopurinol.    MEDICATIONS AND ALLERGIES:   Reviewed and updated in EPIC    Medication and allergy review was completed by the pharmacist and discussed during this visit. Please see their note within this encounter.    SOCIAL/FAMILY HISTORY:  Social History:  Reviewed in Epic    Biopsychosocial barriers to care and advanced directives were addressed by the Child psychotherapist and discussed during the visit. Please see their note within this encounter.    REVIEW OF SYSTEMS:    All other systems reviewed are negative except as noted here or in HPI.    PHYSICAL EXAM  Vitals:    Vitals:    05/28/18 0840   BP: 136/90   Pulse: 98   Temp: 36.9 ??C (98.5 ??F)   SpO2: 99%       Exam:  General:  NAD  Lungs; CTA  Heart: RRR  ABd: obese, + BS, soft, non-tender  Ext:  No c/c/e, has splint on left leg due to drop foot  Labs:  Reviewed from discharge.  See Epic labs.  Radiology: Reviewed radiology studies from discharge. See in Epic.       MEDICAL/SURGICAL HISTORY:  Patient Active Problem List   Diagnosis   ??? CAD (coronary artery disease) (RAF-[name])   ??? Idiopathic chronic gout of left knee   ??? Hypercholesteremia   ??? Essential hypertension (RAF-[name])   ??? 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 Osteoarthritis   ??? Chronic nonalcoholic liver disease   ??? GERD (gastroesophageal reflux disease)   ??? Mild intermittent asthma without complication   ??? Chronic pain syndrome   ??? Paroxysmal atrial fibrillation (CMS-[name])   ??? OSA (obstructive sleep apnea)   ??? Syncope   ??? Tobacco use disorder   ??? Status post cardiac pacemaker procedure   ??? Type 2 diabetes mellitus with stage 4 chronic kidney disease, with long-term current use of insulin (CMS-[name])   ??? Cardiac pacemaker in situ   ??? Recurrent major depressive disorder, in full remission (CMS-[name])   ??? Adjustment disorder, unspecified   ??? Chronic heart failure with reduced ejection fraction and diastolic dysfunction (CMS-[name])   ??? Hyperkalemia   ??? CKD (chronic kidney disease) stage 4, GFR 15-29 ml/min (CMS-[name])   ??? Acquired left foot drop   ??? Decreased sensation of lower extremity, left   ??? Chronic anticoagulation

## 2018-05-28 ENCOUNTER — Ambulatory Visit: Admit: 2018-05-28 | Discharge: 2018-05-29 | Payer: MEDICARE

## 2018-05-28 DIAGNOSIS — Z79899 Other long term (current) drug therapy: Secondary | ICD-10-CM

## 2018-05-28 DIAGNOSIS — E1122 Type 2 diabetes mellitus with diabetic chronic kidney disease: Secondary | ICD-10-CM

## 2018-05-28 DIAGNOSIS — N184 Chronic kidney disease, stage 4 (severe): Secondary | ICD-10-CM

## 2018-05-28 DIAGNOSIS — I10 Essential (primary) hypertension: Secondary | ICD-10-CM

## 2018-05-28 DIAGNOSIS — I5042 Chronic combined systolic (congestive) and diastolic (congestive) heart failure: Secondary | ICD-10-CM

## 2018-05-28 DIAGNOSIS — Z794 Long term (current) use of insulin: Secondary | ICD-10-CM

## 2018-05-28 DIAGNOSIS — R55 Syncope and collapse: Secondary | ICD-10-CM

## 2018-05-28 DIAGNOSIS — K219 Gastro-esophageal reflux disease without esophagitis: Secondary | ICD-10-CM

## 2018-05-28 DIAGNOSIS — I480 Paroxysmal atrial fibrillation: Secondary | ICD-10-CM

## 2018-05-28 DIAGNOSIS — I2510 Atherosclerotic heart disease of native coronary artery without angina pectoris: Secondary | ICD-10-CM

## 2018-05-28 DIAGNOSIS — Z09 Encounter for follow-up examination after completed treatment for conditions other than malignant neoplasm: Principal | ICD-10-CM

## 2018-05-28 MED ORDER — OMEPRAZOLE 20 MG CAPSULE,DELAYED RELEASE
ORAL_CAPSULE | Freq: Every day | ORAL | 3 refills | 0 days | Status: CP
Start: 2018-05-28 — End: 2018-10-12
  Filled 2018-05-28: qty 90, 90d supply, fill #0

## 2018-05-28 MED FILL — NOVOLOG FLEXPEN U-100 INSULIN ASPART 100 UNIT/ML (3 ML) SUBCUTANEOUS: 34 days supply | Qty: 15 | Fill #1 | Status: AC

## 2018-05-28 MED FILL — NOVOLOG FLEXPEN U-100 INSULIN ASPART 100 UNIT/ML (3 ML) SUBCUTANEOUS: SUBCUTANEOUS | 34 days supply | Qty: 15 | Fill #1

## 2018-05-28 MED FILL — RENVELA 800 MG TABLET: 30 days supply | Qty: 180 | Fill #0 | Status: AC

## 2018-05-28 MED FILL — VENTOLIN HFA 90 MCG/ACTUATION AEROSOL INHALER: RESPIRATORY_TRACT | 25 days supply | Qty: 18 | Fill #1

## 2018-05-28 MED FILL — OZEMPIC 0.25 MG OR 0.5 MG (2 MG/1.5 ML) SUBCUTANEOUS PEN INJECTOR: SUBCUTANEOUS | 56 days supply | Qty: 1.5 | Fill #1

## 2018-05-28 MED FILL — UNIFINE PENTIPS 31 GAUGE X 5/16" NEEDLE (8 MM): 40 days supply | Qty: 200 | Fill #3

## 2018-05-28 MED FILL — OZEMPIC 0.25 MG OR 0.5 MG (2 MG/1.5 ML) SUBCUTANEOUS PEN INJECTOR: 56 days supply | Qty: 2 | Fill #1 | Status: AC

## 2018-05-28 MED FILL — UNIFINE PENTIPS 31 GAUGE X 5/16" NEEDLE: 40 days supply | Qty: 200 | Fill #3 | Status: AC

## 2018-05-28 MED FILL — BUMETANIDE 2 MG TABLET: 90 days supply | Qty: 90 | Fill #0 | Status: AC

## 2018-05-28 MED FILL — VENTOLIN HFA 90 MCG/ACTUATION AEROSOL INHALER: 25 days supply | Qty: 18 | Fill #1 | Status: AC

## 2018-05-28 MED FILL — OMEPRAZOLE 20 MG CAPSULE,DELAYED RELEASE: 90 days supply | Qty: 90 | Fill #0 | Status: AC

## 2018-05-28 NOTE — Unmapped (Signed)
REASON FOR VISIT: Hospital Follow-up Medication Management    ASSESSMENT AND PLAN:  #CAD  - Praluent, ezetimibe, atorvastatin 80mg  daily for hyperlipidemia  - BP 136/90 today (ideal goal <130/80), pt did not take AM meds today. Reasonable to monitor and adjust at next visit if needed  - no aspirin as he is on ticagrelor and rivaroxaban    #HFrEF  - EF 40-45% 05/11/18, decreased from 55% 03/11/18, was as low as 15-20% 02/2017  - carvedilol 50mg  BID, losartan 50mg  daily (target is 150mg  daily) hydralazine 25mg  TID, isosorbide mononitrate 30mg  daily [usual hydralazine/isosorbide combo is using isosorbide dinitrate]  - Isosorbide mononitrate appears to be for hypertension (see 07/16/16 note)  - bumetanide 2mg  BID (weight 333lbs today, 294lbs on 2/22 while inpatient, but accuracy unclear and 333lbs appears to be closer to his baseline. He also reports weight loss, not gain. Defer to Dr. Rose Fillers.    #afib  - amiodarone 200mg  daily for rhythm control  - LFTs normal 05/22/18, TSH 5.0 05/13/18, T4 normal 05/13/18, pulm function not available  - consider PFTs for monitoring of pulm function while on amiodarone; unfortunately no clinical staff available to do spirometry today. Ideally will arrange for day of next visit  - Xarelto 15mg  nightly with food for stroke ppx and hx of DVT. Recommend trough level given BMI >40 and wt >120kg. Discussed with Dr. Hulan Fess who reports that they have already had extensive conversations about his weight and the uncertainty of Xarelto. He is not a great candidate for warfarin given hx of frequent missed clinic visits. Will defer possible Xarelto trough monitoring to Dr. Hulan Fess. He is up to date on other monitoring:  CrCl 49 mL/min   AST/ALT normal 05/22/18  CBC 05/23/18 (anemic, but stable hgb 10.3, PLT normal)  15mg  nightly is an appropriate dose given CrCl 15-50 mL/min and pt >6 mo from last DVT    #hx of DVT  - on Xarelto 15mg  nightly w/ food as noted above    #DM  - A1c 9.7% 03/11/18  - Levemir 40 units nightly, Novolog 15 units TID, semaglutide 0.25mg  Q7 days with plans to increase to 0.5mg  q7 days after 4 doses (per wife he hasn't had 4 doses yet) max Ozempic dose is 1mg  q7 days  - not currently on metformin; consider restarting at next visit as it is unclear why it was stopped  - SGLT2i limited by renal function    #CKD   - sevelamer 1600mg  with meals, Phos 5.7 05/15/18, 4.3 on admission 05/13/18  - recommend rechecking phos with next set of labs    #Tobacco  - smokeless tobacco, not interested in cessation per care management    #Gout  - allopurinol 50mg  QOD  - UA 8.8 05/11/18  - not addressed today; consider increasing allopurinol to 50mg  daily at follow-up    #Mental health  - confirmed that he continues taking fluoxetine   - quetiapine is prn for headaches, none in past 3 weeks    #Medication Management  - Medications prescribed or ordered upon discharge were reviewed today and reconciled with the most recent outpatient medication list.  Medication reconciliation was conducted by a prescribing practitioner, or clinical pharmacist.   - Reviewed the indication, dose, and frequency of each medication with patient    Recommendations and medication-related problems were discussed directly with Dr. Rose Fillers who will see Jeremy Oliver immediately following this visit. Updated medication list and medication changes will be provided to the patient as a printed after visit  summary.    HISTORY OF PRESENT ILLNESS:   Jeremy Oliver is a 48 y.o. year old male with CAD, STEMI (2018) complicated by cardiogenic shock following VT arrest with emergent PCI to mid LAD,??Ischemic cardiomyopathy??with ICD [facility]),??CKD, paroxysmal afib, HTN,??HLD,??T2DM, and morbid obesity??who was recently hospitalized for syncopal episode which was thought to be secondary to hypotension (amlodipine ultimately decreased from 10mg  to 5mg  daily), presents to the Internal Medicine clinic for hospital follow up.     Today, he is accompanied by his wife, Jeremy Oliver, who manages his medications and provides much of the medication history.    Denies any s/sx of bleeding.  SMBG 150-200 throughout the day per pt/wife's memory  Reports waking at night 4x per week. Uses albuterol MDI with relief.    Jeremy Oliver did not bring medication bottles, denies missed doses of medications, uses a pillbox, does not need refills today and reports he has not taken AM meds yet today.    Patient does not have to do without their medications because of cost.     DISCREPANCIES NOTED DURING MED REVIEW:  1.   Still on 0.25mg  of Ozempic - per wife has not yet increased as he hasn't had 4 doses    MEDICATIONS AND ALLERGIES:   Reviewed and updated in EPIC      ___________________________________________________   Jeanella Flattery, PharmD, CPP

## 2018-05-28 NOTE — Unmapped (Signed)
REASON FOR VISIT: Hospital Follow-up Care Management    PLAN:  #Housing  Pt is working with another agency in the community to find housing and has a meeting with them on Friday, 06/04/18. SWI encouraged pt to share [facility] Care Management contact information with this person in case there is a way the clinic's social work team can be of support. Pt voiced agreement. SWI to f/u with pt by phone on Friday afternoon.     #Advance Care Planning   Pt's HCPOA already in chart.    Health Care Decision Maker as of 05/28/2018  Jeremy Oliver  Jeremy Oliver    #Contact information  Pt provided w/ Patient Resource guide including After Hours, Same Day, Urgent Care, mental health, [facility] and SW contact information. Pt's contact information was updated in their chart.    #F/u calls  Pt will receive 2 weeks of telephone f/u from a care team member following visit. Pt encouraged to contact LCSW should needs arise.    ___________________________________________________     HISTORY AND ASSESSMENT:   Jeremy Oliver presents to the Internal Medicine clinic for hospital f/u with his wife, Jeremy Oliver, who went back out to the waiting room during the visit. He reports feeling tired since discharge. He also states that whenever he experiences a major change in temperature (e.g., going outside when it's cold), he starts coughing and then throws up. He says that this has happened every day since he left the hospital. MD and PharmD notified.     Of note, since the pt's discharge he and his wife have had to move all of their belongings into storage. This has added to his tiredness and kept him from relaxing and recovering from his inpatient stay.    Pt was evicted from his home during his hospital admission. He says that his previous home had many health and safety concerns (crumbling ceiling, black mold, shaky railing). Currently the pt and his wife are living with his daughter, [name], and his 7-year-old grandson. He denies issues with home safety. He is currently working with someone who is helping connect him to housing. Jeremy Oliver believes this person is from DSS and that the housing is linked to his Medicaid; however, he did not have any more information about this person or program. He is meeting with them next Friday.     The patient receives support from his wife, daughter and dog Jeremy Oliver. Family, social and cultural characteristics were assessed during the visit and plan.     He is not set up with home health services or personal care service/personal aide support. Currently his wife, who is unemployed d/t a workplace injury (and is set up with a worker's Occupational hygienist), is providing all of his caregiving. Previously he was receiving aide services from Jeremy Oliver and would like to get this set back up with the same person once he moves into his own home. Pt receives Chronic Care Management; case manager is Jeremy Oliver, Charity fundraiser.     DME not assessed. Pt is interested in pill packs but gets his rxs through [facility], which does not offer this service. He uses pill boxes and reports support from his wife in keeping them stocked.     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 His smokeless tobacco use includes chew.  Pt saw a tobacco treatment specialist while inpatient. Has no interest in quitting.   PHQ9: Scored 2, negative for SI and HI. He currently sees Jeremy Latina, LCSW at [facility] for counseling and has an appt scheduled for 06/29/18. He was previous rx'd Prozac and Seroquil but has not taken them for months d/t side effect of sleepiness. MD and PharmD notified. Pt declined further intervention.    Notably, pt reports a hobby of fixing up guns (some for hunting, some antique) and says that he has more than 40. States that most are locked up and a few are in a glass case. Pt denies safety issues surrounding these weapons.     Advanced Directives: does  have on file.     Visit discussed directly w/ Forde Radon, PharmD, CPP and Clyda Hurdle, MD who will also see pt during the visit.  ___________________________________________________   Charlyne Quale  MSW Intern for Ferol Luz, MSW, LCSW  [facility]  [phone]

## 2018-05-28 NOTE — Unmapped (Signed)
Medicine Changes:  ?? Start Prilosec once daily for gastric reflux symptoms    Things to do:  ?? Please share the phone number for [facility] Care Management [phone]) with the person who is helping connect you to housing. He is welcome to call us with any questions, concerns, or information needed to help you transition to your new home.    Follow-up:   ?? Follow-up with Dr. Hulan Fess as scheduled  ?? Spirometry (lung function tests) have been ordered    Call or Come Back to Clinic if:   ?? Increasing dizziness or passing out        IMPORTANT NUMBERS  Care Manager  I???m having trouble with my health because of cost, my mood, trouble getting to clinic, or where I live. How can I get help?  ? Call Ferol Luz, MSW, LCSW at [phone] your Care Manager can help!  ? They are available Monday-Friday 8:00AM -5:00PM    Financial Counselor: [phone]    Same Day Clinic   I'm sick today and need an appointment during office hours. Who do I call?  ?? Call [phone] or toll free [phone], ask for an appointment in the Same Day Clinic  ?? Same Day Clinic is located here in the [facility]    After Hours, Weekend, or Holidays:  It's after 5:00pm during the week or it's the weekend. How do I get medical attention?  ?? Call the [facility] 24/7 Nursing Line [phone] to get nurse advice.  ?? Go to [facility] Urgent Care walk-in clinic at [address], [address], [place], [place]; [phone]; 7 days a week from 9:00AM - 8:00PM.    How do I schedule an appointment or get a message to my doctor?  Call [phone] or toll free [phone].    How do I cancel or reschedule an appointment?  ?? Call [phone] or toll free [phone].   You Beitz also cancel your appointment by using [facility] (patient portal).    How do I request medication refills?  Request a refill via [facility] (patient portal), call clinic at [phone] or have your pharmacy fax the request to [phone].

## 2018-05-28 NOTE — Unmapped (Signed)
Says went to pharmacy and several have been discontinued and he wants to know why?  He was told this by the pharmacy.  He also says the medication that was prescribed today is not at the pharmacy (looks like it did not go through) ([place]).  His # is [phone]

## 2018-05-31 ENCOUNTER — Encounter
Admit: 2018-05-31 | Discharge: 2018-06-04 | Disposition: A | Discharge status: Home Health | Payer: MEDICARE | Source: Ambulatory Visit | Admitting: Internal Medicine

## 2018-05-31 ENCOUNTER — Ambulatory Visit
Admit: 2018-05-31 | Discharge: 2018-06-04 | Disposition: A | Discharge status: Home Health | Payer: MEDICARE | Source: Ambulatory Visit | Admitting: Internal Medicine

## 2018-05-31 DIAGNOSIS — N200 Calculus of kidney: Principal | ICD-10-CM

## 2018-05-31 DIAGNOSIS — Z6841 Body Mass Index (BMI) 40.0 and over, adult: Principal | ICD-10-CM

## 2018-05-31 DIAGNOSIS — T465X5A Adverse effect of other antihypertensive drugs, initial encounter: Principal | ICD-10-CM

## 2018-05-31 DIAGNOSIS — I2510 Atherosclerotic heart disease of native coronary artery without angina pectoris: Principal | ICD-10-CM

## 2018-05-31 DIAGNOSIS — I480 Paroxysmal atrial fibrillation: Principal | ICD-10-CM

## 2018-05-31 DIAGNOSIS — E875 Hyperkalemia: Principal | ICD-10-CM

## 2018-05-31 DIAGNOSIS — I252 Old myocardial infarction: Principal | ICD-10-CM

## 2018-05-31 DIAGNOSIS — Z7901 Long term (current) use of anticoagulants: Principal | ICD-10-CM

## 2018-05-31 DIAGNOSIS — I208 Other forms of angina pectoris: Principal | ICD-10-CM

## 2018-05-31 DIAGNOSIS — Z550 Illiteracy and low-level literacy: Principal | ICD-10-CM

## 2018-05-31 DIAGNOSIS — Z95810 Presence of automatic (implantable) cardiac defibrillator: Principal | ICD-10-CM

## 2018-05-31 DIAGNOSIS — K219 Gastro-esophageal reflux disease without esophagitis: Principal | ICD-10-CM

## 2018-05-31 DIAGNOSIS — K760 Fatty (change of) liver, not elsewhere classified: Principal | ICD-10-CM

## 2018-05-31 DIAGNOSIS — E1321 Other specified diabetes mellitus with diabetic nephropathy: Principal | ICD-10-CM

## 2018-05-31 DIAGNOSIS — N184 Chronic kidney disease, stage 4 (severe): Principal | ICD-10-CM

## 2018-05-31 DIAGNOSIS — I5022 Chronic systolic (congestive) heart failure: Principal | ICD-10-CM

## 2018-05-31 DIAGNOSIS — G4733 Obstructive sleep apnea (adult) (pediatric): Principal | ICD-10-CM

## 2018-05-31 DIAGNOSIS — E785 Hyperlipidemia, unspecified: Principal | ICD-10-CM

## 2018-05-31 DIAGNOSIS — Z79899 Other long term (current) drug therapy: Principal | ICD-10-CM

## 2018-05-31 DIAGNOSIS — I130 Hypertensive heart and chronic kidney disease with heart failure and stage 1 through stage 4 chronic kidney disease, or unspecified chronic kidney disease: Principal | ICD-10-CM

## 2018-05-31 DIAGNOSIS — E1122 Type 2 diabetes mellitus with diabetic chronic kidney disease: Principal | ICD-10-CM

## 2018-05-31 DIAGNOSIS — I952 Hypotension due to drugs: Principal | ICD-10-CM

## 2018-05-31 DIAGNOSIS — R112 Nausea with vomiting, unspecified: Principal | ICD-10-CM

## 2018-05-31 DIAGNOSIS — F4323 Adjustment disorder with mixed anxiety and depressed mood: Principal | ICD-10-CM

## 2018-05-31 DIAGNOSIS — S0590XA Unspecified injury of unspecified eye and orbit, initial encounter: Principal | ICD-10-CM

## 2018-05-31 DIAGNOSIS — G894 Chronic pain syndrome: Principal | ICD-10-CM

## 2018-05-31 DIAGNOSIS — F410 Panic disorder [episodic paroxysmal anxiety] without agoraphobia: Principal | ICD-10-CM

## 2018-05-31 DIAGNOSIS — Z794 Long term (current) use of insulin: Principal | ICD-10-CM

## 2018-05-31 DIAGNOSIS — I10 Essential (primary) hypertension: Principal | ICD-10-CM

## 2018-05-31 DIAGNOSIS — M109 Gout, unspecified: Principal | ICD-10-CM

## 2018-05-31 DIAGNOSIS — E1140 Type 2 diabetes mellitus with diabetic neuropathy, unspecified: Principal | ICD-10-CM

## 2018-05-31 DIAGNOSIS — E119 Type 2 diabetes mellitus without complications: Principal | ICD-10-CM

## 2018-05-31 DIAGNOSIS — R55 Syncope and collapse: Principal | ICD-10-CM

## 2018-05-31 DIAGNOSIS — J4520 Mild intermittent asthma, uncomplicated: Principal | ICD-10-CM

## 2018-05-31 DIAGNOSIS — J45909 Unspecified asthma, uncomplicated: Principal | ICD-10-CM

## 2018-05-31 DIAGNOSIS — E1142 Type 2 diabetes mellitus with diabetic polyneuropathy: Principal | ICD-10-CM

## 2018-05-31 DIAGNOSIS — I4892 Unspecified atrial flutter: Principal | ICD-10-CM

## 2018-05-31 DIAGNOSIS — Z955 Presence of coronary angioplasty implant and graft: Principal | ICD-10-CM

## 2018-05-31 DIAGNOSIS — R4182 Altered mental status, unspecified: Principal | ICD-10-CM

## 2018-05-31 DIAGNOSIS — R3129 Other microscopic hematuria: Principal | ICD-10-CM

## 2018-05-31 DIAGNOSIS — G459 Transient cerebral ischemic attack, unspecified: Principal | ICD-10-CM

## 2018-05-31 DIAGNOSIS — G43909 Migraine, unspecified, not intractable, without status migrainosus: Principal | ICD-10-CM

## 2018-05-31 DIAGNOSIS — E669 Obesity, unspecified: Principal | ICD-10-CM

## 2018-05-31 DIAGNOSIS — R079 Chest pain, unspecified: Principal | ICD-10-CM

## 2018-05-31 DIAGNOSIS — R569 Unspecified convulsions: Principal | ICD-10-CM

## 2018-05-31 DIAGNOSIS — Z8673 Personal history of transient ischemic attack (TIA), and cerebral infarction without residual deficits: Principal | ICD-10-CM

## 2018-05-31 DIAGNOSIS — F329 Major depressive disorder, single episode, unspecified: Principal | ICD-10-CM

## 2018-05-31 DIAGNOSIS — I4891 Unspecified atrial fibrillation: Principal | ICD-10-CM

## 2018-05-31 DIAGNOSIS — I214 Non-ST elevation (NSTEMI) myocardial infarction: Principal | ICD-10-CM

## 2018-05-31 DIAGNOSIS — N183 Chronic kidney disease, stage 3 (moderate): Principal | ICD-10-CM

## 2018-05-31 DIAGNOSIS — M1990 Unspecified osteoarthritis, unspecified site: Principal | ICD-10-CM

## 2018-05-31 DIAGNOSIS — Z590 Homelessness: Principal | ICD-10-CM

## 2018-05-31 LAB — COMPREHENSIVE METABOLIC PANEL
ALBUMIN: 3.3 g/dL — ABNORMAL LOW (ref 3.5–5.0)
ALT (SGPT): 13 U/L (ref <50)
ANION GAP: 12 mmol/L (ref 7–15)
BILIRUBIN TOTAL: 0.2 mg/dL (ref 0.0–1.2)
BLOOD UREA NITROGEN: 26 mg/dL — ABNORMAL HIGH (ref 7–21)
BUN / CREAT RATIO: 8
CALCIUM: 8.8 mg/dL (ref 8.5–10.2)
CHLORIDE: 105 mmol/L (ref 98–107)
CO2: 20 mmol/L — ABNORMAL LOW (ref 22.0–30.0)
CREATININE: 3.42 mg/dL — ABNORMAL HIGH (ref 0.70–1.30)
EGFR CKD-EPI AA MALE: 23 mL/min/{1.73_m2} — ABNORMAL LOW (ref >=60)
EGFR CKD-EPI NON-AA MALE: 20 mL/min/{1.73_m2} — ABNORMAL LOW (ref >=60)
GLUCOSE RANDOM: 392 mg/dL — ABNORMAL HIGH (ref 70–179)
GLUCOSE RANDOM: 392 mg/dL — ABNORMAL HIGH (ref 70–179)
POTASSIUM: 5.2 mmol/L — ABNORMAL HIGH (ref 3.5–5.0)
PROTEIN TOTAL: 6.8 g/dL (ref 6.5–8.3)
SODIUM: 137 mmol/L (ref 135–145)

## 2018-05-31 LAB — CBC W/ AUTO DIFF
BASOPHILS ABSOLUTE COUNT: 0.1 10*9/L (ref 0.0–0.1)
BASOPHILS RELATIVE PERCENT: 0.7 % — ABNORMAL LOW (ref 31.0–37.0)
EOSINOPHILS ABSOLUTE COUNT: 0.1 10*9/L (ref 0.0–0.4)
EOSINOPHILS RELATIVE PERCENT: 1.4 %
HEMATOCRIT: 36.5 % — ABNORMAL LOW (ref 41.0–53.0)
HEMOGLOBIN: 11.8 g/dL — ABNORMAL LOW (ref 13.5–17.5)
LARGE UNSTAINED CELLS: 2 % (ref 0–4)
LYMPHOCYTES ABSOLUTE COUNT: 1.7 10*9/L (ref 1.5–5.0)
LYMPHOCYTES RELATIVE PERCENT: 17.6 %
MEAN CORPUSCULAR HEMOGLOBIN CONC: 32.5 g/dL (ref 31.0–37.0)
MEAN CORPUSCULAR VOLUME: 84.3 fL (ref 80.0–100.0)
MEAN PLATELET VOLUME: 8.5 fL (ref 7.0–10.0)
MONOCYTES ABSOLUTE COUNT: 0.5 10*9/L (ref 0.2–0.8)
MONOCYTES RELATIVE PERCENT: 5.2 %
NEUTROPHILS ABSOLUTE COUNT: 7.2 10*9/L (ref 2.0–7.5)
NEUTROPHILS RELATIVE PERCENT: 73 %
PLATELET COUNT: 352 10*9/L (ref 150–440)
RED BLOOD CELL COUNT: 4.32 10*12/L — ABNORMAL LOW (ref 4.50–5.90)
WBC ADJUSTED: 9.9 10*9/L (ref 4.5–11.0)

## 2018-05-31 LAB — PROTIME-INR: INR: 0.99 sec (ref 10.2–13.1)

## 2018-05-31 LAB — APTT: HEPARIN CORRELATION: 0.2 sec (ref 25.9–39.5)

## 2018-05-31 NOTE — Unmapped (Signed)
Jeremy Oliver:    Patients wife called and wanted to make sure Dr. Hulan Fess is aware Jeremy Oliver is in the ER (I figured she already knows since clinic sent him there).  She also wanted Dr. Hulan Fess to let Dr. Andrey Farmer know.

## 2018-05-31 NOTE — Unmapped (Signed)
I heard.  Patient's wife was worried about you

## 2018-05-31 NOTE — Unmapped (Signed)
Patient rounds completed. The following patient needs were addressed:  Pain, Toileting, Positioning;  SUPINE, Personal Belongings, Plan of Care, Call Bell in Reach and Bed in lowest position. Wheels locked. Pt resting in NAD. No other issue.

## 2018-05-31 NOTE — Unmapped (Signed)
Bed: 20-B  Expected date:   Expected time:   Means of arrival:   Comments:  Stretcher pt

## 2018-05-31 NOTE — Unmapped (Signed)
I saw and evaluated the patient, participating in the key portions of the service.  I reviewed the resident???s note.  I agree with the resident???s findings and plan. Tawnya Crook, MD

## 2018-05-31 NOTE — Unmapped (Signed)
Pt BIB EMS from [facility] clinic reports right side chest pain associated with Syncopal episode X2. Hx of recent NSTEMI with stent placement

## 2018-05-31 NOTE — Unmapped (Signed)
[facility]  Emergency Department Provider Note      ED Clinical Impression     Final diagnoses:   Chest pain, unspecified type (Primary)   Recurrent episodes of unresponsiveness       Initial Impression, ED Course, Assessment and Plan     Impression: Chest Pain    Presents from [facility] clinic via EMS with recurrent episodes of unresponsiveness, chest pain, cough and nausea/ vomiting.    Given 8mg  zofran en route by EMS. Blood sugar noted to be mildly elevated. Given 12.5 phenergan in ED.    EKG x 2 to evaluate for ACS, which is thought to be most likely. Will add influenza. Chest pain labs, coags, CXR.    2:48 PM  I spoke with cardiology fellow, Dr. Toma Copier, who is in agreement with the plan for interrogation of ICD.  If ICD shows runs of ventricular dysrhythmia he recommends admission to cardiology service.  If not, would recommend admission to observation.  He has been admitted for this before.    EKG is similar to previous, does not show a STEMI.    BP 163/85  - Pulse 86  - Temp 37.1 ??C (98.8 ??F) (Oral)  - Resp 20  - SpO2 93%     Patient is not pale, he is not gray.  He is intermittently coughing and retching and says pain feels like anginal equivalent, center chest without radiation. His significant other is at bedside eating [employer], appears not to be significantly concerned by this ED visit.    3pm   Patient signed out to Dr. Elita Boone. Cars RN had interrogated ICD and verbally reported there were no concerning episodes. Anticipate follow up labs and admission.      Additional Medical Decision Making     I have reviewed the vital signs and the nursing notes. Labs and radiology results that were available during my care of the patient were independently reviewed by me and considered in my medical decision making.     I staffed the case with the ED attending, Dr. Greer Pickerel.    Portions of this record have been created using Scientist, clinical (histocompatibility and immunogenetics). Dictation errors have been sought, but Kittel not have been identified and corrected.  ____________________________________________       History     Chief Complaint  Chest Pain      HPI   Shawn Route Cai is a 48 y.o. male with past medical history of CAD, STEMI 2018 complicated by cardiogenic shock following VT arrest with emergent PCI to mid LAD, ischemic cardiomyopathy with ICD, CKD, paroxysmal A. fib, HTN, HLD, T2 DM and morbid obesity who presents to the emergency department from [facility] clinic with chest pain, recurrent episodes of unresponsiveness, n/v, cough.  Denies fever, productive cough, diarrhea.  No abdominal pain.        Past Medical History:   Diagnosis Date   ??? Angina at rest (CMS-[name])    ??? Arthritis    ??? Asthma    ??? Atrial fibrillation and flutter (CMS-[name]) 06/08/14   ??? BMI 40.0-44.9, adult (CMS-[name]) 10/29/2015   ??? Chronic anticoagulation 02/17/2018   ??? Chronic kidney disease, stage 3 (CMS-[name])    ??? Chronic pain syndrome 04/11/2014    ~Use daily lyrica as recommended ~Tramadol as needed for pain ~Fluoxetine daily as recommended ~Daily exercise ~Eat a healthy diet ~Good control of your blood sugars can help    ??? Coronary artery disease    ??? Depression    ??? Diabetes  mellitus (CMS-[name])    ??? Eye trauma 1998    glass in both eyes and removed   ??? GERD (gastroesophageal reflux disease)    ??? Gout    ??? Homeless    ??? Hyperlipidemia    ??? Hypertension    ??? Illiteracy and low-level literacy    ??? Microscopic hematuria    ??? Migraine    ??? Nephrolithiasis    ??? 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 Nonalcoholic fatty liver disease    ??? NSTEMI (non-ST elevated myocardial infarction) (CMS-[name]) 01/13/2017   ??? Obesity    ??? Obstructive sleep apnea 2009   ??? Panic attacks    ??? 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 Seizure-like activity (CMS-[name])     ~Monitor for symptoms and report them to your primary care provider if they occur    ??? Systolic CHF, chronic (CMS-[name])     EF 40-45% 2017   ??? TIA (transient ischemic attack)        Patient Active Problem List   Diagnosis   ??? CAD (coronary artery disease) (RAF-[name])   ??? Idiopathic chronic gout of left knee   ??? Hypercholesteremia   ??? Essential hypertension (RAF-[name])   ??? 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 Osteoarthritis   ??? Chronic nonalcoholic liver disease   ??? GERD (gastroesophageal reflux disease)   ??? Mild intermittent asthma without complication   ??? Chronic pain syndrome   ??? Paroxysmal atrial fibrillation (CMS-[name])   ??? OSA (obstructive sleep apnea)   ??? Syncope   ??? Tobacco use disorder   ??? Status post cardiac pacemaker procedure   ??? Type 2 diabetes mellitus with stage 4 chronic kidney disease, with long-term current use of insulin (CMS-[name])   ??? Cardiac pacemaker in situ   ??? Recurrent major depressive disorder, in full remission (CMS-[name])   ??? Adjustment disorder, unspecified   ??? Chronic heart failure with reduced ejection fraction and diastolic dysfunction (CMS-[name])   ??? Hyperkalemia   ??? CKD (chronic kidney disease) stage 4, GFR 15-29 ml/min (CMS-[name])   ??? Acquired left foot drop   ??? Decreased sensation of lower extremity, left   ??? Chronic anticoagulation       Past Surgical History:   Procedure Laterality Date   ??? CARDIAC CATHETERIZATION  03/08/2007   ??? CYSTOURETHROSCOPY  12/31/2009     Microscopic hematuria   ??? KNEE SURGERY Right 09/23/2006    arthroscopic knee surgery medial and lateral meniscus tears as well as crystal disease   ??? PR CATH PLACE/CORON ANGIO, IMG SUPER/INTERP,R&L HRT CATH, L HRT VENTRIC N/A 02/13/2017    Procedure: Left/Right Heart Catheterization;  Surgeon: Marlaine Hind, MD;  Location: [facility] CATH;  Service: Cardiology   ??? PR CATH PLACE/CORON ANGIO, IMG SUPER/INTERP,W LEFT HEART VENTRICULOGRAPHY N/A 03/17/2017    Procedure: Left Heart Catheterization W Intervention;  Surgeon: Marlaine Hind, MD;  Location: [facility] CATH;  Service: Cardiology   ??? PR CATH PLACE/CORON ANGIO, IMG SUPER/INTERP,W LEFT HEART VENTRICULOGRAPHY N/A  05/12/2018    Procedure: CATH LEFT HEART CATHETERIZATION W INTERVENTION;  Surgeon: Dorathy Kinsman, MD;  Location: [facility] CATH;  Service: Cardiology   ??? PR ECMO/ECLS INITIATION VENO-ARTERIAL N/A 03/19/2017    Procedure: ECMO/ECLS; INITIATION, VENO-ARTERIAL;  Surgeon: Lennie Odor, MD;  Location: MAIN OR [facility];  Service: Cardiac Surgery   ??? PR ECMO/ECLS RMVL PRPH CANNULA OPEN 6 YRS & OLDER Left 03/25/2017    Procedure: ECMO / ECLS PROVIDED BY PHYSICIAN; REMOVAL OF PERIPHERAL (ARTERIAL AND/OR VENOUS) CANNULA(E), OPEN, 6 YEARS AND OLDER;  Surgeon: Alonna Buckler Ikonomidis, MD;  Location: MAIN OR [facility];  Service: Cardiac Surgery   ??? PR EPHYS EVAL W/ ABLATION SUPRAVENT ARRHYTHMIA N/A 07/19/2015    Catheter ablation of cavotricuspid isthmus for atrial flutter [facility])   ??? PR INSER HART PACER XVENOUS ATRIAL N/A 05/30/2015    [employer] dual-chamber pacemaker implant (E.Chung)   ??? PR INSERT/PLACE FLOW DIRECT CATH N/A 03/17/2017    Procedure: Insert Leave In [place];  Surgeon: Marlaine Hind, MD;  Location: [facility] CATH;  Service: Cardiology   ??? PR INSJ/RPLCMT PERM DFB W/TRNSVNS LDS 1/DUAL CHMBR N/A 05/04/2017    Procedure: ICD Implant System (Single/Dual);  Surgeon: Eldred Manges, MD;  Location: [facility] CATH;  Service: Cardiology   ??? PR NEGATIVE PRESSURE WOUND THERAPY DME >50 SQ CM Left 03/25/2017    Procedure: Neg Press Wound Tx (Vac Assist) Incl Topicals, Per Session, Tsa Greater Than/= 50 Cm Squared;  Surgeon: Alonna Buckler Ikonomidis, MD;  Location: MAIN OR [facility];  Service: Cardiac Surgery   ??? PR PRQ TRLUML CORONARY STENT W/ANGIO ONE ART/BRNCH N/A 03/12/2018    Procedure: Percutaneous Coronary Intervention;  Surgeon: Marlaine Hind, MD;  Location: [facility] CATH;  Service: Cardiology   ??? PR REBL VES DIRECT,LOW EXTREM Left 03/19/2017    Procedure: Repr Bld Vessel Direct; Lower Extrem;  Surgeon: Boykin Reaper, MD;  Location: MAIN OR [facility];  Service: Vascular   ??? PR REMV ART CLOT ILIAC-POP,LEG INCIS Left 03/25/2017    Procedure: EMBOLECTOMY OR THROMBECTOMY, WITH OR WITHOUT CATHETER; FEMOROPOPLITEAL, AORTOILIAC ARTERY, BY LEG INCISION;  Surgeon: Alonna Buckler Ikonomidis, MD;  Location: MAIN OR [facility];  Service: Cardiac Surgery   ??? PR UPPER GI ENDOSCOPY,BIOPSY N/A 02/28/2016    Procedure: UGI ENDOSCOPY; WITH BIOPSY, SINGLE OR MULTIPLE;  Surgeon: Liane Comber, MD;  Location: [facility];  Service: Gastroenterology   ??? PR UPPER GI ENDOSCOPY,DIAGNOSIS N/A 05/07/2016    Procedure: UGI ENDO, INCLUDE ESOPHAGUS, STOMACH, & DUODENUM &/OR JEJUNUM; DX W/WO COLLECTION SPECIMN, BY BRUSH OR WASH;  Surgeon: Modena Nunnery, MD;  Location: [facility];  Service: Gastroenterology       No current facility-administered medications for this encounter.     Current Outpatient Medications:   ???  acetaminophen (TYLENOL) 500 MG tablet, TAKE 2 TABLETS (1 000MG ) BY MOUTH EVERY 8 HOURS AS NEEDED FOR PAIN, Disp: 180 each, Rfl: 3  ???  albuterol HFA 90 mcg/actuation inhaler, Inhale 2 puffs into the lungs every 6 (six) hours as needed for wheezing or shortness of breath., Disp: 18 g, Rfl: 2  ???  alirocumab (PRALUENT PEN) 75 mg/mL PnIj, INJECT THE CONTENTS OF 1 PEN (75 MG) UNDER THE SKIN EVERY 14 DAYS, Disp: 2 mL, Rfl: 11  ???  allopurinol (ZYLOPRIM) 100 MG tablet, Take 0.5 tablets (50 mg total) by mouth every other day., Disp: 30 tablet, Rfl: 11  ???  amiodarone (PACERONE) 200 MG tablet, TAKE 1 TABLET BY MOUTH ONCE DAILY, Disp:  90 tablet, Rfl: 3  ???  atorvastatin (LIPITOR) 80 MG tablet, Take 1 tablet (80 mg total) by mouth daily., Disp: 90 tablet, Rfl: 3  ???  blood-glucose meter kit, Use to check blood sugars, #1 meter, #100 strips. Freestyle glucose meter. ICD-9 250.00, Disp: 1 each, Rfl: 0  ???  blood-glucose sensor (FREESTYLE NAVIGATOR GLUC SENS) Devi, Frequency:BID   Dosage:0.0     Instructions:  Note:Dose: 1, Disp: , Rfl:   ???  bumetanide (BUMEX) 2 MG tablet, TAKE 1 TABLET BY MOUTH ONCE DAILY, Disp: 90 tablet, Rfl: 3  ???  carvedilol (COREG) 25 MG tablet, TAKE 1 TABLET BY MOUTH WITH THE MORNING MEAL AND TAKE 2 TABLETS WITH THE EVENING MEAL, Disp: 270 tablet, Rfl: 3  ???  cholecalciferol, vitamin D3, 5,000 unit capsule, Take 1 capsule (5,000 Units total) by mouth daily., Disp: 100 capsule, Rfl: 0  ???  diclofenac sodium (VOLTAREN) 1 % gel, Apply 2 g topically Three (3) times a day as needed for pain., Disp: , Rfl:   ???  empty container Misc, Use as directed to dispose of injectable medications, Disp: 1 each, Rfl: 3  ???  ezetimibe (ZETIA) 10 mg tablet, TAKE 1 TABLET BY MOUTH ONCE DAILY, Disp: 90 each, Rfl: 3  ???  FLUoxetine (PROZAC) 20 MG capsule, Take 60 mg by mouth nightly., Disp: , Rfl:   ???  gabapentin (NEURONTIN) 300 MG capsule, Take 2 capsules (600 mg total) by mouth nightly., Disp: 180 capsule, Rfl: 3  ???  hydrALAZINE (APRESOLINE) 25 MG tablet, Take 1 tablet (25 mg total) by mouth Three (3) times a day., Disp: 270 tablet, Rfl: 3  ???  insulin ASPART (NOVOLOG FLEXPEN U-100 INSULIN) 100 unit/mL (3 mL) injection pen, Inject 0.15 mL (15 Units total) under the skin Three (3) times a day with a meal., Disp: 15 mL, Rfl: 11  ???  insulin detemir U-100 (LEVEMIR) 100 unit/mL (3 mL) injection pen, Inject 40 Units under the skin nightly., Disp: , Rfl:   ???  insulin syringe-needle U-100 0.3 mL 31 gauge x 5/16 Syrg, Use to inject insulin twice daily with meals, Disp: 60 each, Rfl: 0  ???  isosorbide mononitrate (IMDUR) 30 MG 24 hr tablet, Take 1 tablet (30 mg total) by mouth daily., Disp: 90 tablet, Rfl: 3  ???  lancets 30 gauge Misc, USE TO CHECK BLOOD SUGAR THREE TIMES DAILY WITH MEALS, Disp: 250 each, Rfl: 2  ???  losartan (COZAAR) 50 MG tablet, Take 1 tablet (50 mg total) by mouth daily., Disp: 90 tablet, Rfl: 3  ???  melatonin 3 mg cap, Take 9 mg by mouth nightly., Disp: , Rfl:   ???  nitroglycerin (NITROSTAT) 0.4 MG SL tablet, Place 1 tablet (0.4 mg total) under the tongue every five (5) minutes as needed for chest pain., Disp: 100 each, Rfl: 3  ???  omeprazole (PRILOSEC) 20 MG capsule, Take 1 capsule (20 mg total) by mouth daily., Disp: 90 capsule, Rfl: 3  ???  QUEtiapine (SEROQUEL) 50 MG tablet, Take 1 tablet (50 mg total) by mouth two (2) times a day as needed (headache)., Disp: 180 tablet, Rfl: 3  ???  rivaroxaban (XARELTO) 15 mg Tab, Take 1 tablet (15 mg total) by mouth daily with evening meal., Disp: 90 tablet, Rfl: 3  ???  semaglutide 0.25 mg or 0.5 mg(2 mg/1.5 mL) PnIj, Inject 0.25 mg under the skin every seven (7) days. For 4 weeks then increase to 0.5mg  every 7 days, Disp: 7.5 mL, Rfl: 0  ???  sevelamer (RENVELA) 800 mg tablet, TAKE 2 TABLETS (1600MG ) BY MOUTH THREE TIMES DAILY WITH MEALS, Disp: 540 tablet, Rfl: 3  ???  ticagrelor (BRILINTA) 90 mg Tab, TAKE 1 TABLET BY MOUTH TWICE DAILY, Disp: 180 tablet, Rfl: 3  ???  traMADol (ULTRAM) 50 mg tablet, Take 1 tablet (50 mg total) by mouth every six (6) hours as needed., Disp: 30 tablet, Rfl: 0  ???  UNIFINE PENTIPS 31 gauge x 5/16 Ndle, USE WITH INSULIN AND VICTOZA 5 TIMES DAILY, Disp: 400 each, Rfl: 3    Allergies  Morphine    Family History   Problem Relation Age of Onset   ??? Diabetes Mother    ??? Hyperlipidemia Mother    ??? Heart disease Mother    ??? Basal cell carcinoma Mother    ??? Blindness Mother         diabeties   ??? Obesity Mother    ??? Hyperlipidemia Father    ??? Heart disease Father    ??? Lung disease Father    ??? Heart disease Half-Sister    ??? Kidney disease Half-Sister    ??? Gallbladder disease Half-Brother    ??? Obesity Half-Brother    ??? No Known Problems Daughter    ??? No Known Problems Son    ??? Obesity Daughter    ??? Melanoma Neg Hx    ??? Squamous cell carcinoma Neg Hx    ??? Macular degeneration Neg Hx    ??? Glaucoma Neg Hx        Social History  Social History     Tobacco Use   ??? Smoking status: Never Smoker   ??? Smokeless tobacco: Current User     Types: Chew   ??? Tobacco comment: Pt dips 1 can a week   Substance Use Topics   ??? Alcohol use: Never     Alcohol/week: 0.0 standard drinks     Frequency: Never     Comment: rare use: couple times a year, small amount   ??? Drug use: No       Review of Systems:  All other systems have been reviewed and are negative except as otherwise documented in the HPI.      Physical Exam     ED Triage Vitals [05/31/18 1430]   Enc Vitals Group      BP 154/98      Heart Rate 103      SpO2 Pulse       Resp 14      Temp       Temp src       SpO2 100 %       Constitutional: In mild discomfort, no acute respiratory distress.  Eyes: Conjunctivae are normal. EOMI.  HEENT:       Head: Normocephalic and atraumatic.       Nose: No epistaxis or congestion.       Mouth/Throat: Clear oropharynx. Moist mucous membranes. No stertor.       Neck: full range of motion, no stridor.  Cardiovascular: Faint heart sounds. CABG scar, well-healed. Cap refill < 2 sec.  Respiratory: No increased work of breathing. Clear to auscultation bilaterally. Intermittent cough.  Gastrointestinal: Obese, Soft, non-tender, non-distended.  Musculoskeletal: No ecchymosis or cyanosis.  Neurologic: Intermittent episodes where he slumps to one side and keeps eyes open, no nystagmus, will not resond until you call his name or physically stimulate. During these episodes, there are no vital sign abnormalities and there is no wide complex on the  rhythm strip. Normal speech and language. No gross focal neurologic deficits are appreciated.  Skin: Skin is warm, dry and intact. No acute rash noted.      EKG     Similar to previous.  No ST elevation in lateral, inferior or anterior leads.    Radiology     EP RN only - ICD / Pacemaker Evaluation         XR Chest 1 view Portable   Final Result      No acute airspace disease.      ED POCUS RUSH Protocol Emergency         ED POCUS RUSH Protocol Emergency    (Results Pending)                Robbi Garter, MD  Resident  05/31/18 [phone]

## 2018-05-31 NOTE — Unmapped (Addendum)
Patient's wife calling back to see why these are not at the pharmacy?   This is what I sent last week.  Needs to know.    Says went to pharmacy and several have been discontinued and he wants to know why?  He was told this by the pharmacy.  He also says the medication that was prescribed today is not at the pharmacy (looks like it did not go through) (Omprezole).  His # is [phone].  Two were his Levemir (pharmacy is telling them the dose has changed to twice a day and she is wondering what is going on since it is a long acting insulin) & Fluoxetine.  He needs these

## 2018-05-31 NOTE — Unmapped (Addendum)
[facility] SAME DAY CLINIC NOTE     05/31/2018    PCP: Kurtis Bushman, MD     Assessment and Plan    Jeremy Oliver was seen today for nausea, emesis and chest pain.    Diagnoses and all orders for this visit:    Paroxysmal chest pain, nausea/vomiting, syncope: presented for acute worsening of chest pain and N/V since discharge from recent hospitalization. Syncopal event in clinic (see HPI for details), patient did not lose pulse, no CPR performed, no tonic/clonic movements, loss of consciousness lasted ~1 minute or less. Vomiting that occurred in clinic nonbloody noncoffee ground appearing. Zofran 8 mg PO and SL nitro x1 given prior to EMS  - patient transferred to ED with transfer of care to EMS at bedside in [facility]   - given patient's description of paroxysmal chest pain, worsened N/V and possible episode of melena in setting of recent DES broad differential consider ACS (risk for ISR), gastroenteritis, GIB, perforated esophageal/gastric ulcer, aortic aneurysm among other etiologies    Follow up as scheduled or sooner as needed.    Patient was seen and discussed with Dr. Rich Brave who is in agreement with the assessment and plan as outlined above.       Subjective    Problem List:  Patient Active Problem List   Diagnosis   ??? CAD (coronary artery disease) (RAF-[name])   ??? Idiopathic chronic gout of left knee   ??? Hypercholesteremia   ??? Essential hypertension (RAF-[name])   ??? 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 Osteoarthritis   ??? Chronic nonalcoholic liver disease   ??? GERD (gastroesophageal reflux disease)   ??? Mild intermittent asthma without complication   ??? Chronic pain syndrome   ??? Paroxysmal atrial fibrillation (CMS-[name])   ??? OSA (obstructive sleep apnea)   ??? Syncope   ??? Tobacco use disorder   ??? Status post cardiac pacemaker procedure   ??? Type 2 diabetes mellitus with stage 4 chronic kidney disease, with long-term current use of insulin (CMS-[name])   ??? Cardiac pacemaker in situ   ??? Recurrent major depressive disorder, in full remission (CMS-[name])   ??? Adjustment disorder, unspecified   ??? Chronic heart failure with reduced ejection fraction and diastolic dysfunction (CMS-[name])   ??? Hyperkalemia   ??? CKD (chronic kidney disease) stage 4, GFR 15-29 ml/min (CMS-[name])   ??? Acquired left foot drop   ??? Decreased sensation of lower extremity, left   ??? Chronic anticoagulation       HPI  Jeremy Oliver is a 48 y.o. year old male with the above problem list who presents to the same day clinic for   Chief Complaint   Patient presents with   ??? Nausea   ??? Emesis   ??? Chest Pain     CAD, STEMI (2018) complicated by cardiogenic shock following VT arrest with emergent PCI to mid LAD,??Ischemic cardiomyopathy??with ICD [facility] [employer]),??CKD, paroxysmal afib (on amio, xarelto), HTN,??HLD,??T2DM, HFrEF (EF 40-45%)  and morbid obesity??    Admitted 2/11-2/18 with PCI to LAD     Admitted to obs 2/22 to 2/23 to obs for syncope, felt to be from overmedication of hypertension     Seen in hospital f/u 2/28 with no recurrence of syncopal sx    was complaining of dry heaving at that time, chronic     Not at that time states ROS positive for chills, dyspnea, pain and  tightness in chest, cough and wheezing, nausea, vomiting, dry mouth but it is not clear to what extent  this was adressed in A&P     Patient states that in the last 24-48h his chest pain and nausea/vomiting has worsened. He estimates at least 10 episodes of 24-48h and reports he has not been able to keep food, medicines, or liquids down. He denies fever, chills, abdominal pain. Denies hematemesis or coffee ground emesis but had an episode of black stool this morning.     States his chest pain has been paroxysmal but severe in onset. His N/V is worse with exertion.     During the interview, the patient had acute episode of unresponsiveness. This occurred while patient was sitting on exam table. There were no tonic-clonic movements. I immediately was able to obtain brisk carotid pulse and patient placed in Trendelenburg position. Blood pressure was 196/109 (after triage of 180/96). Patient was initially not responsive to verbal/noxious stimuli but became responsive within roughly 1 minute. No post-ictal state, no loss of bowel/bladder. EMS called, while awaiting arrival patient complained of severe stabbing chest pain, patient placed on 2L O2 and SL nitro x1 given prior to bedside transfer of care to EMS.     Brief review of ECG before transfer to EMS revealed NSR with no obvious ST depressions or elevations. Comparison to prior ECG deferred given patient condition.     Meds and allergies were reviewed in Epic    ROS: 10 point ROS was performed and is otherwise negative other than mentioned in the HPI    Objective  PE:  Vitals:    05/31/18 1341   BP: 196/109   Pulse: 99   Resp:    Temp:    SpO2: 97%     General: ill appearing, complaining of chest pain  Eyes: anicteric sclear, EOMI  Ext: WWP, no gross LE edema  CV: carotid pulse 2+ bilaterally  Skin: clammy, nondiaphoretic, no rash/lesion on clothed exam   Abdomen: protuberant, soft, NTND    Remainder of physical exam deferred given patient's acute decompensation. Care transferred to EMS at bedside       Procedure: None  See procedure note from this encounter    Metric Tracker:  Did today's visit result in referral to ED or direct admission? ED

## 2018-05-31 NOTE — Unmapped (Signed)
ICD Interrogation  Manufacturer: [employer] [facility]     Battery: 12.5 years estimated longevity  Mode: DDD,   Lower rate limit 70 bpm  Tachy Settings:      Presenting Rhythm: NSR   Underlying rhythm: NSR    Impedence (ohms)  Atrial lead 543   Right Ventricular lead 304     Sensing (mV)  (P)Atrial lead 6.9  (R)Ventricular lead 11.3    Capture threshold (V @ ms)  Atrial lead 0.4 V @ 0.4 ms  Right Ventricular lead 0.7 V @ 0.4 ms         Diagnostics/Episodes  ?? none     Reprogramming  ?? none    See PDF in media tab for full interrogation record    Questions, please call the Ep Device Rn via Vocera or dial ext [phone]  Ep Device clinic [phone]

## 2018-05-31 NOTE — Unmapped (Signed)
ED Progress Note    I received sign-out from Dr. Reece Leader at 3:00 pm.    Briefly, Jeremy Oliver is a 48 y.o. male with a PMH of CAD, STEMI 2018 complicated by cardiogenic shock following VT arrest with emergent PCI to mid LAD, ischemic cardiomyopathy with ICD, CKD, paroxysmal a-fib, HTN, HLD, T2DM, and morbid obesity who presented to the ED for evaluation of right-sided chest pain with syncopal episodes x2.    CBC with no leukocytosis, no anemia. Coags WNL. Pending remaining labs including troponin and ICD interrogation.    3:17 PM  RN performed ICD evaluation. There were no ventricular events identified on ICD evaluation.    3:45 PM  Initial troponin negative. BMP and creatinine at patient baseline. Pro-BNP elevated to 1680. Rapid flu negative. Will discuss with MAO.    4:54 PM  Discussed with OBS, who have evaluated patient. States patient prefers to go home over admission. He states that patient heart rate was 80s while laying down and increased to 120s upon standing. Patient also become dizzy and lightheaded. Will give fluids and repeat orthostatics.    Ultimately, patient agreeable to obs admission. Care assumed by the admitting team. Patient subsequently admitted without complication.    ---    Documentation assistance was provided by Pamala Duffel, Scribe, on May 31, 2018 at 3:17 PM for Remus Blake, MD.    Documentation assistance was provided by the scribe in my presence.  The documentation recorded by the scribe has been reviewed by me and accurately reflects the services I personally performed.

## 2018-05-31 NOTE — Unmapped (Signed)
Transferred to ED via EMS

## 2018-06-01 DIAGNOSIS — E875 Hyperkalemia: Principal | ICD-10-CM

## 2018-06-01 DIAGNOSIS — I130 Hypertensive heart and chronic kidney disease with heart failure and stage 1 through stage 4 chronic kidney disease, or unspecified chronic kidney disease: Principal | ICD-10-CM

## 2018-06-01 DIAGNOSIS — E669 Obesity, unspecified: Principal | ICD-10-CM

## 2018-06-01 DIAGNOSIS — R569 Unspecified convulsions: Principal | ICD-10-CM

## 2018-06-01 DIAGNOSIS — I208 Other forms of angina pectoris: Principal | ICD-10-CM

## 2018-06-01 DIAGNOSIS — F410 Panic disorder [episodic paroxysmal anxiety] without agoraphobia: Principal | ICD-10-CM

## 2018-06-01 DIAGNOSIS — E785 Hyperlipidemia, unspecified: Principal | ICD-10-CM

## 2018-06-01 DIAGNOSIS — E1321 Other specified diabetes mellitus with diabetic nephropathy: Principal | ICD-10-CM

## 2018-06-01 DIAGNOSIS — N183 Chronic kidney disease, stage 3 (moderate): Principal | ICD-10-CM

## 2018-06-01 DIAGNOSIS — N200 Calculus of kidney: Principal | ICD-10-CM

## 2018-06-01 DIAGNOSIS — I2510 Atherosclerotic heart disease of native coronary artery without angina pectoris: Principal | ICD-10-CM

## 2018-06-01 DIAGNOSIS — S0590XA Unspecified injury of unspecified eye and orbit, initial encounter: Principal | ICD-10-CM

## 2018-06-01 DIAGNOSIS — M109 Gout, unspecified: Principal | ICD-10-CM

## 2018-06-01 DIAGNOSIS — Z794 Long term (current) use of insulin: Principal | ICD-10-CM

## 2018-06-01 DIAGNOSIS — Z79899 Other long term (current) drug therapy: Principal | ICD-10-CM

## 2018-06-01 DIAGNOSIS — K760 Fatty (change of) liver, not elsewhere classified: Principal | ICD-10-CM

## 2018-06-01 DIAGNOSIS — Z590 Homelessness: Principal | ICD-10-CM

## 2018-06-01 DIAGNOSIS — G43909 Migraine, unspecified, not intractable, without status migrainosus: Principal | ICD-10-CM

## 2018-06-01 DIAGNOSIS — K219 Gastro-esophageal reflux disease without esophagitis: Principal | ICD-10-CM

## 2018-06-01 DIAGNOSIS — I10 Essential (primary) hypertension: Principal | ICD-10-CM

## 2018-06-01 DIAGNOSIS — E119 Type 2 diabetes mellitus without complications: Principal | ICD-10-CM

## 2018-06-01 DIAGNOSIS — G894 Chronic pain syndrome: Principal | ICD-10-CM

## 2018-06-01 DIAGNOSIS — I5022 Chronic systolic (congestive) heart failure: Principal | ICD-10-CM

## 2018-06-01 DIAGNOSIS — N184 Chronic kidney disease, stage 4 (severe): Principal | ICD-10-CM

## 2018-06-01 DIAGNOSIS — Z6841 Body Mass Index (BMI) 40.0 and over, adult: Principal | ICD-10-CM

## 2018-06-01 DIAGNOSIS — M1990 Unspecified osteoarthritis, unspecified site: Principal | ICD-10-CM

## 2018-06-01 DIAGNOSIS — Z95810 Presence of automatic (implantable) cardiac defibrillator: Principal | ICD-10-CM

## 2018-06-01 DIAGNOSIS — Z550 Illiteracy and low-level literacy: Principal | ICD-10-CM

## 2018-06-01 DIAGNOSIS — G4733 Obstructive sleep apnea (adult) (pediatric): Principal | ICD-10-CM

## 2018-06-01 DIAGNOSIS — Z7901 Long term (current) use of anticoagulants: Principal | ICD-10-CM

## 2018-06-01 DIAGNOSIS — R55 Syncope and collapse: Principal | ICD-10-CM

## 2018-06-01 DIAGNOSIS — T465X5A Adverse effect of other antihypertensive drugs, initial encounter: Principal | ICD-10-CM

## 2018-06-01 DIAGNOSIS — R3129 Other microscopic hematuria: Principal | ICD-10-CM

## 2018-06-01 DIAGNOSIS — I4892 Unspecified atrial flutter: Principal | ICD-10-CM

## 2018-06-01 DIAGNOSIS — Z8673 Personal history of transient ischemic attack (TIA), and cerebral infarction without residual deficits: Principal | ICD-10-CM

## 2018-06-01 DIAGNOSIS — G459 Transient cerebral ischemic attack, unspecified: Principal | ICD-10-CM

## 2018-06-01 DIAGNOSIS — Z955 Presence of coronary angioplasty implant and graft: Principal | ICD-10-CM

## 2018-06-01 DIAGNOSIS — J4520 Mild intermittent asthma, uncomplicated: Principal | ICD-10-CM

## 2018-06-01 DIAGNOSIS — F329 Major depressive disorder, single episode, unspecified: Principal | ICD-10-CM

## 2018-06-01 DIAGNOSIS — I5042 Chronic combined systolic (congestive) and diastolic (congestive) heart failure: Principal | ICD-10-CM

## 2018-06-01 DIAGNOSIS — E1140 Type 2 diabetes mellitus with diabetic neuropathy, unspecified: Principal | ICD-10-CM

## 2018-06-01 DIAGNOSIS — J45909 Unspecified asthma, uncomplicated: Principal | ICD-10-CM

## 2018-06-01 DIAGNOSIS — I252 Old myocardial infarction: Principal | ICD-10-CM

## 2018-06-01 DIAGNOSIS — I952 Hypotension due to drugs: Principal | ICD-10-CM

## 2018-06-01 DIAGNOSIS — E1122 Type 2 diabetes mellitus with diabetic chronic kidney disease: Principal | ICD-10-CM

## 2018-06-01 DIAGNOSIS — I480 Paroxysmal atrial fibrillation: Principal | ICD-10-CM

## 2018-06-01 DIAGNOSIS — I4891 Unspecified atrial fibrillation: Principal | ICD-10-CM

## 2018-06-01 DIAGNOSIS — I214 Non-ST elevation (NSTEMI) myocardial infarction: Principal | ICD-10-CM

## 2018-06-01 DIAGNOSIS — F4323 Adjustment disorder with mixed anxiety and depressed mood: Principal | ICD-10-CM

## 2018-06-01 DIAGNOSIS — E1142 Type 2 diabetes mellitus with diabetic polyneuropathy: Principal | ICD-10-CM

## 2018-06-01 LAB — COMPREHENSIVE METABOLIC PANEL
ALBUMIN: 2.8 g/dL — ABNORMAL LOW (ref 3.5–5.0)
ALKALINE PHOSPHATASE: 83 U/L (ref 38–126)
ALT (SGPT): 10 U/L (ref <50)
ANION GAP: 12 mmol/L (ref 7–15)
AST (SGOT): 12 U/L — ABNORMAL LOW (ref 19–55)
BILIRUBIN TOTAL: 0.2 mg/dL (ref 0.0–1.2)
BLOOD UREA NITROGEN: 30 mg/dL — ABNORMAL HIGH (ref 7–21)
BUN / CREAT RATIO: 8
CALCIUM: 8.6 mg/dL (ref 8.5–10.2)
CHLORIDE: 105 mmol/L (ref 98–107)
CO2: 22 mmol/L (ref 22.0–30.0)
CREATININE: 3.81 mg/dL — ABNORMAL HIGH (ref 0.70–1.30)
EGFR CKD-EPI AA MALE: 20 mL/min/{1.73_m2} — ABNORMAL LOW (ref >=60)
EGFR CKD-EPI NON-AA MALE: 17 mL/min/{1.73_m2} — ABNORMAL LOW (ref >=60)
GLUCOSE RANDOM: 164 mg/dL (ref 70–179)
GLUCOSE RANDOM: 164 mg/dL — ABNORMAL HIGH (ref 70–179)
POTASSIUM: 5 mmol/L (ref 3.5–5.0)
PROTEIN TOTAL: 5.8 g/dL — ABNORMAL LOW (ref 6.5–8.3)

## 2018-06-01 LAB — BLOOD GAS CRITICAL CARE PANEL, VENOUS
CALCIUM IONIZED VENOUS (MG/DL): 4.59 mg/dL (ref 4.40–5.40)
GLUCOSE WHOLE BLOOD: 214 mg/dL — ABNORMAL HIGH (ref 70–179)
HCO3 VENOUS: 25 mmol/L (ref 22–27)
HEMOGLOBIN BLOOD GAS: 10.2 g/dL — ABNORMAL LOW (ref 13.50–17.50)
LACTATE BLOOD VENOUS: 1.6 mmol/L (ref 0.5–1.8)
O2 SATURATION VENOUS: 80.1 % (ref 40.0–85.0)
PCO2 VENOUS: 44 mmHg (ref 40–60)
PH VENOUS: 7.36 (ref 7.32–7.43)
PO2 VENOUS: 46 mmHg (ref 30–55)
POTASSIUM WHOLE BLOOD: 4.4 mmol/L (ref 3.4–4.6)
SODIUM WHOLE BLOOD: 138 mmol/L (ref 135–145)

## 2018-06-01 LAB — CBC W/ AUTO DIFF
BASOPHILS ABSOLUTE COUNT: 0 10*9/L (ref 0.0–0.1)
BASOPHILS RELATIVE PERCENT: 0.5 %
EOSINOPHILS ABSOLUTE COUNT: 0.2 10*9/L (ref 0.0–0.4)
EOSINOPHILS RELATIVE PERCENT: 1.8 %
HEMATOCRIT: 30.3 % — ABNORMAL LOW (ref 41.0–53.0)
HEMOGLOBIN: 9.4 g/dL — ABNORMAL LOW (ref 13.5–17.5)
LARGE UNSTAINED CELLS: 3 % (ref 0–4)
LYMPHOCYTES ABSOLUTE COUNT: 2.2 10*9/L (ref 1.5–5.0)
LYMPHOCYTES RELATIVE PERCENT: 23.6 %
MEAN CORPUSCULAR HEMOGLOBIN CONC: 31 g/dL (ref 31.0–37.0)
MEAN CORPUSCULAR VOLUME: 86.3 fL (ref 80.0–100.0)
MEAN PLATELET VOLUME: 8.7 fL (ref 7.0–10.0)
MONOCYTES ABSOLUTE COUNT: 0.6 10*9/L (ref 0.2–0.8)
MONOCYTES RELATIVE PERCENT: 6.3 %
NEUTROPHILS ABSOLUTE COUNT: 6 10*9/L (ref 2.0–7.5)
NEUTROPHILS RELATIVE PERCENT: 65.3 %
PLATELET COUNT: 250 10*9/L (ref 150–440)
RED BLOOD CELL COUNT: 3.51 10*12/L — ABNORMAL LOW (ref 4.50–5.90)
RED CELL DISTRIBUTION WIDTH: 14.9 % (ref 12.0–15.0)
WBC ADJUSTED: 9.2 10*9/L (ref 4.5–11.0)

## 2018-06-01 LAB — BASIC METABOLIC PANEL
ANION GAP: 8 mmol/L (ref 7–15)
BLOOD UREA NITROGEN: 28 mg/dL — ABNORMAL HIGH (ref 7–21)
BUN / CREAT RATIO: 8
CALCIUM: 8.6 mg/dL (ref 8.5–10.2)
CREATININE: 3.35 mg/dL — ABNORMAL HIGH (ref 0.70–1.30)
EGFR CKD-EPI AA MALE: 24 mL/min/{1.73_m2} — ABNORMAL LOW (ref >=60)
EGFR CKD-EPI NON-AA MALE: 20 mL/min/{1.73_m2} — ABNORMAL LOW (ref >=60)
GLUCOSE RANDOM: 196 mg/dL — ABNORMAL HIGH (ref 70–179)
POTASSIUM: 4.7 mmol/L (ref 3.5–5.0)
SODIUM: 140 mmol/L (ref 135–145)

## 2018-06-01 LAB — BLOOD GAS, VENOUS
BASE EXCESS VENOUS: -1.4 (ref -2.0–2.0)
HCO3 VENOUS: 25 mmol/L (ref 22–27)
O2 SATURATION VENOUS: 52.6 % (ref 40.0–85.0)
PCO2 VENOUS: 56 mmHg (ref 40–60)

## 2018-06-01 NOTE — Unmapped (Signed)
CCM incoming call    CCM Follow-up Call      PCP Action:None      Pt's wife states pt having CT head today.  No diagnosis re: non responsive, continuous emesis and abdominal pain. States the  GI cocktail was helpful.  Pt's wife Will keep CCM posted.

## 2018-06-01 NOTE — Unmapped (Signed)
Patient rounds completed. The following patient needs were addressed:  Pain, Toileting, Positioning: lying on the left, Personal Belongings, Plan of Care, Call Bell in Reach and Bed in lowest position. Wheels locked. Pt resting in NAD. No other issue.

## 2018-06-01 NOTE — Unmapped (Addendum)
Care Management  Initial Transition Planning Assessment                                                      General Assessment:   Care Manager assessed the patient by : In person interview with patient and spouse [name] Maske.    Orientation Level: Oriented X4  Who provides care at home?: Patient reports being independent with ADL's at baseline. Patient reports having access to the following DME: cane, rolling walker, wheelchair and a showerchair and CPAP.   ??  Contact/Decision Maker  Extended Emergency Contact Information  Primary Emergency Contact: Sliger,[name]  Address: [address] [place], [place] [address] [place]  Home Phone: [phone]  Mobile Phone: [phone]  Relation: Spouse  Secondary Emergency Contact: Mezo,Bridgette   [place]  Home Phone: [phone]  Mobile Phone: [phone]  Relation: Daughter  ??  Legal Next of Kin / Guardian / POA / Advance Directives   ??  ??  Advance Directive (Medical Treatment)  Does patient have an advance directive covering medical treatment?: Patient does have an advance directive covering medical treatment, copy on file in Epic.     Patient's designated [place]on Gully is his spouse [name] [place]; Phone: [phone] and Daughter Bridgette Shroff [phone]  Information provided on advance directive:: No  Patient requests assistance:: No  ??  Advance Directive (Mental Health Treatment)  Does patient have an advance directive covering mental health treatment?: Patient does not have advance directive covering mental health treatment.  ??  Patient Information  Lives with: Patient reports living with his spouse [name], their daughter daughter [name] and her sign. other and young child.   ??  Type of Residence: Private Residence  Location/Detail: [address] [place], [place] [address]; Phone: [phone]   ??  Type of Residence: Mailing Address:  [address]  [place]  [place] [address]  Contacts: Accompanied by: Family member  Patient Phone Number: [phone]  ??????   Medical Provider(s): Kurtis Bushman, MD  Reason for Admission: Admitting Diagnosis:?? syncopal episode  Past Medical History:??  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  Past Surgical History:??  has a past surgical history that includes Knee surgery (Right, 09/23/2006); Cardiac catheterization (03/08/2007); Cystourethroscopy (12/31/2009 ); pr inser hart pacer xvenous atrial (N/A, 05/30/2015); pr ephys eval w/ ablation supravent arrhythmia (N/A, 07/19/2015); pr upper gi endoscopy,biopsy (N/A, 02/28/2016); pr upper gi endoscopy,diagnosis (N/A, 05/07/2016); pr cath place/coron angio, img super/interp,r&l hrt cath, l hrt ventric (N/A, 02/13/2017); pr cath place/coron angio, img super/interp,w left heart ventriculography (N/A, 03/17/2017); pr insert/place flow direct cath (N/A, 03/17/2017); pr ecmo/ecls initiation veno-arterial (N/A, 03/19/2017); pr rebl ves direct,low extrem (Left, 03/19/2017); pr ecmo/ecls rmvl prph  cannula open 6 yrs & older (Left, 03/25/2017); pr remv art clot iliac-pop,leg incis (Left, 03/25/2017); pr negative pressure wound therapy dme >50 sq cm (Left, 03/25/2017); pr insj/rplcmt perm dfb w/trnsvns lds 1/dual chmbr (N/A, 05/04/2017); pr prq trluml coronary stent w/angio one art/brnch (N/A, 03/12/2018); and pr cath place/coron angio, img super/interp,w left heart ventriculography (N/A, 05/12/2018).   Previous admit date: 05/11/2018  ??  Primary Insurance- Payor: MEDICARE / Plan: MEDICARE PART A AND PART B / Product Type: *No Product type* /   Secondary Insurance ??? Secondary Insurance  MEDICAID [place]  Prescription Coverage ???   Preferred Pharmacy - [facility] - [place], [place] - [address]   [facility] - [place], [place] - [address]  [facility] [place] - [address]  [facility] [place], [place] - [address]  [facility] [place]  [facility]  ??  Transportation home: Private vehicle   ??  Level of function prior to admission: Independent  ??  Support Systems: Children, Family Members, Spouse  ??  Responsibilities/Dependents at home?: No  ??  Home Care services in place prior to admission?: No  ??  Equipment Currently Used at Home: cane, straight, shower chair, walker, rolling, wheelchair, manual(CPAP)  ??  Currently receiving outpatient dialysis?: No  ??  Financial Information  ??  Need for financial assistance?: No (Patient reports receiving SSDI)  ??  Social Determinants of Health  Social Determinants of Health were addressed in provider documentation.  Please refer to patient history.  ??  Discharge Needs Assessment  Concerns to be Addressed: denies needs/concerns at this time  ??  Clinical Risk Factors: Multiple Diagnoses (Chronic), New Diagnosis  ??  Barriers to taking medications: No  ??  Prior overnight hospital stay or ED visit in last 90 days: No  ??  Readmission Within the Last 30 Days: no previous admission in last 30 days Anticipated Changes Related to Illness: none  ??  Equipment Needed After Discharge: none  ??  Discharge Facility/Level of Care Needs: Patient's reported discharge plan is to return home with spouse and children.  ??  Readmission  Risk of Unplanned Readmission Score:34%       Predictive Model Details           34% (High) Factors Contributing to Score   Calculated 05/23/2018 11:09 27% Number of active Rx orders is 59   [place] Risk of Unplanned Readmission Model 15% Number of ED visits in last six months is 4     10% Number of hospitalizations in last year is 3     6% Active antipsychotic Rx order is present     6% ECG/EKG order is present in last 6 months     5% Latest BUN is high (25 mg/dL)     5% Diagnosis of electrolyte disorder is present     5% Charlson Comorbidity Index is 6   ??  Readmitted within the Last 30 Days? Yes. Patient was recently discharged from [facility] on 05/23/18.     Patient at risk for readmission?: N/A  ??  Discharge Plan  Screen findings are: Care Manager reviewed the plan of the patient's care with the Multidisciplinary Team. No discharge planning needs identified at this time. Care Manager will continue to manage plan and monitor patient's progress with the team.  ??  Expected Discharge Date: (  TBD)  ??  Expected Transfer from Critical Care: N/A  ??  Patient and/or family were provided with choice of facilities / services that are available and appropriate to meet post hospital care needs?: N/A  ??  Initial Assessment complete?: Yes

## 2018-06-01 NOTE — Unmapped (Signed)
Spoke to pt's wife. Will follow up tomorrow. Dr. Krista Blue notifying Dr. Andrey Farmer re: ED visit today.

## 2018-06-01 NOTE — Unmapped (Signed)
Assessment/Plan:     Principal Problem:    Syncope  Active Problems:    CAD (coronary artery disease) (RAF-[name])    Essential hypertension (RAF-[name])    Obesity, Class III, BMI 40-49.9 (morbid obesity) (CMS-[name])    Mild intermittent asthma without complication    Paroxysmal atrial fibrillation (CMS-[name])    OSA (obstructive sleep apnea)    Tobacco use disorder    Type 2 diabetes mellitus with stage 4 chronic kidney disease, with long-term current use of insulin (CMS-[name])    Cardiac pacemaker in situ    Chronic heart failure with reduced ejection fraction and diastolic dysfunction (CMS-[name])  Resolved Problems:    * No resolved hospital problems. *    Shawn Route Borgerding is a 48 y.o. male that presented to [facility] with Syncope. Likely orthostatic compounded by deconditioning. It is also possible that his EF has improved with EBM and revascularization so that his HTN regimen is overtreatment compounded by the nausea and vomiting. The abnormal movements have been shown not to be seizures before and are either myoclonic jerks 2/2 syncope or pseudoseizures. Head CT shows no acute intracranial process. Pacer/defibrillator evaluated at admission and was unremarkable.  - ACS has been excluded with serial troponins. Repeat TropI, ECG, continue telemetry for further CP episodes.  - Trial GI cocktail now for post-prandial CP.  - CMP, VBG with lactate to exclude intra-abdominal process. KUB if no BM by tomorrow.  - TTE today.    Somnolence. Improving. Likely delayed clearance of Dilaudid with CKD.  - CTM, protecting airway.  - avoid further sedating medications.  - Decreased Gabapentin to 300 qhs, seroquel to 25mg .    PAFib. Has implanted atrial pacer+defibrillator.  CAD. Most recent intervention: 2/13 with DES to proximal to mid LAD, balloon angioplasty of diagonal.   - Continue Brillinta, Xarelto 15, Atorva 80, Zetia 10, Imdur 30, Coreg 25 bid.  - Holding Bumex, Losartan, Hydralazine.  - Continue Amiodarone 200. Check LFTs, TSH today. CKDIV. B/l Creat appears to be 3.3  Continue Renvela 1600 tid for hyperphosphatemia.  Check Phos today.    HFrEF. Last EF assessment was pre-PCI 05/2018 and showed EF 40-45%.  Appears mildly hypovolemic.    DMII, not well controlled.   Continue home Lantus 40, Lispro 15u meals. Hold GLP1 agonist.  Reduce gabapentin dose to 300 qhs.    Anxiety/depression.  Continue prozac 60, seroquel PRN.        Maxwell Martorano SAM [facility]  06/01/18  OBS1 Attending  ______________________________________________________________________    Labs/Studies/Diagnostics:   Labs and Studies from the last 24hrs per EMR and Reviewed  __________________________________________________________________    Subjective:    PRN Meds: Tylenol 1000 mg, Seroquel 50 mg, Dilaudid 1 mg IV, Phenergan 12.5 IV.  C significant events logged by Dr. Alberteen Spindle and Leatrice Jewels regarding events overnight.  This morning I discussed the case with the overnight physician and decided to continue to treat the patient observation rather than await stepdown bed.    Patient has been asleep most of the day.  He is arousable to voice and is oriented once aroused.  He had one episode of chest pain upon awakening in the early afternoon but was able to go back to sleep.  The chest pain occurred about 45 minutes after eating.  It was accompanied also by nausea without emesis.  He reported it was more of a pressure sensation.     Also of note, his spouse requested a different provider after the nurse practitioner started with an  assessment this morning.  As patient was asleep, I obtained a lot of history from his spouse.  They recently been evicted from their home and have been living with her daughter for the past several months.  His spouse reports that she is very concerned about his syncopal episodes and what has now become emesis with nearly every meal.  She reports that every time he is checked, he has low blood pressures after he passes out.  However, she also reports that most of the time his blood pressure is running high at home.  She does think he has lost weight and that the emesis is more or less constant.  He often says I do not feel good.  I do not feel good.  The bottom line is his wife feels that something is being missed or overlooked and she is very concerned with his current state and trend.  He has had normal bowel movements he reports nearly every day.  His wife reports that he often has pills and medications in his emesis.    Objective:  Vital signs in last 24 hours:  Temp:  [36.5 ??C (97.7 ??F)-37.5 ??C (99.5 ??F)] 37.5 ??C (99.5 ??F)  Heart Rate:  [70-115] 77  SpO2 Pulse:  [85-95] 95  Resp:  [12-20] 17  BP: (66-163)/(41-95) 135/82  MAP (mmHg):  [49-111] 94  FiO2 (%):  [98 %] 98 %  SpO2:  [83 %-98 %] 96 %  BMI (Calculated):  [43.35] 43.35    Intake/Output last 3 shifts:  I/O last 3 completed shifts:  In: 500 [IV Piggyback:500]  Out: 425 [Urine:425]    My assessment: P: 78, BP: 100/70.    Constitutional: sleeping, snoring, arousable to voice.   ENT: Mucous membranes moist. EOMI, pupils are small, pinpoint but reactive.  Neck: Supple. No JVD.   Respiratory: Lungs CTAB. Normal work of breathing.  Cardiovascular: muffled due to chest wall interference. Normal rate, regular rhythm. No murmurs, rubs, or gallops. Trace LE edema.   Abdomen: Soft, non-tender, non-distended. Bowel sounds present. Obese.  Skin: No rashes or lesions.  Musculoskeletal: Strength 5/5 in all extremities.  Psychiatric: Oriented to self, place, and time once awakened. Slurred speech when awakened but appropriate.

## 2018-06-01 NOTE — Unmapped (Addendum)
Pt came around 2100 alert and oriented x 4, jovial, and feeling well.  Around midnight, he told the NA while she was taking his vitals that he was not feeling well, the NA asked him a few questions and saw he was not doing well, so she called me.   I tried to do a neuro exam, but he was not arousable enough to answer my questions or follow my commands.  I decided to call a rapid, during rapid his vitals remained stable, blood gases were drawn, EKG was done, and a CT scan.  All results were negative.  When the rapid nurses came back with the patient, they explained that they had to stop at the Neuro ICU b/c the pt was moving as if he was going to seize, at the Neuro ICU they check vitals and BP was 60s/20s, they checked BP multiple times to be sure, ICU providers saw the pt and they said they said he does that all the time, but he was probably going to be fine. Providers ordered a bolus of 250 ml of NaCl.   Pt was back to baseline when he came back to the Unit after CT right before 3 am.     Problem: Adult Inpatient Plan of Care  Goal: Plan of Care Review  Outcome: Progressing  Goal: Patient-Specific Goal (Individualization)  Outcome: Progressing  Flowsheets (Taken 06/01/2018 0245)  Patient-Specific Goals (Include Timeframe): Discharge on 3/3  Goal: Absence of Hospital-Acquired Illness or Injury  Outcome: Progressing  Goal: Optimal Comfort and Wellbeing  Outcome: Progressing  Goal: Readiness for Transition of Care  Outcome: Progressing  Goal: Rounds/Family Conference  Outcome: Progressing     Problem: Infection  Goal: Infection Symptom Resolution  Outcome: Progressing     Problem: Fall Injury Risk  Goal: Absence of Fall and Fall-Related Injury  Outcome: Progressing     Problem: Arrhythmia/Dysrhythmia (Heart Failure)  Goal: Stable Heart Rate and Rhythm  Outcome: Progressing     Problem: Fluid Imbalance (Heart Failure)  Goal: Fluid Balance  Outcome: Progressing     Problem: Respiratory Compromise (Heart Failure)  Goal: Effective Oxygenation and Ventilation  Outcome: Progressing     Problem: Heart Failure Comorbidity  Goal: Maintenance of Heart Failure Symptom Control  Outcome: Progressing     Problem: Hypertension Comorbidity  Goal: Blood Pressure in Desired Range  Outcome: Progressing     Problem: Obstructive Sleep Apnea Risk or Actual (Comorbidity Management)  Goal: Unobstructed Breathing During Sleep  Outcome: Progressing     Problem: Wound  Goal: Optimal Wound Healing  Outcome: Progressing

## 2018-06-01 NOTE — Unmapped (Signed)
I have not refilled.  No notation at hospital f/u clinic appt that these were discontinued.  Only note that omeprazole was added.  Patient currently readmitted for syncope and will defer refill of any medications until after he is discharged.

## 2018-06-01 NOTE — Unmapped (Addendum)
Assessment/Plan:     Principal Problem:    Syncope  Active Problems:    CAD (coronary artery disease) (RAF-[name])    Essential hypertension (RAF-[name])    Obesity, Class III, BMI 40-49.9 (morbid obesity) (CMS-[name])    Mild intermittent asthma without complication    Paroxysmal atrial fibrillation (CMS-[name])    OSA (obstructive sleep apnea)    Type 2 diabetes mellitus with stage 4 chronic kidney disease, with long-term current use of insulin (CMS-[name])    Cardiac pacemaker in situ    Chronic heart failure with reduced ejection fraction and diastolic dysfunction (CMS-[name])  Resolved Problems:    * No resolved hospital problems. *      Jeremy Oliver is a 48 y.o. male with PMHx CAD, STEMI (2018) complicated by cardiogenic shock following VT arrest with emergent PCI to mid LAD,??Ischemic cardiomyopathy??with ICD [facility]),??CKD, paroxysmal afib (on amio, xarelto), HTN,??HLD,??T2DM, HFrEF (EF 40-45%)  and morbid obesity??who presented to [facility] with loss of consciousness.     SYNCOPE: patient will undergo further evaluation and testing in the Observation Unit for complaints of syncope. The presentation of syncope is a chronic problem for this patient.  Certainly high risk given hx of significant coronary disease and HFrEF but I think the most likely cause of this event is again orthostasis/relative hypotension.  Patient had recurrence of symptoms when I stood him up in the ED, did not have time to check her blood pressure however his heart rate did go from 80-120.  Description of syncopal episode in [facility] however is concerning especially seeing as patient complained of chest pain.  Will admit for further evaluation and treatment to the Observation unit.      Syncope High-Risk Features  History: New onset CP, SOB, abd. pain  Past Medical History: Severe structural heart disease or CAD (heart failure, low EF, prior MI)  Physical Exam: None  ECG: None #     - Cardiac monitoring  -Receiving fluids in the ED  -Repeat orthostatics  - Falls Risk Screen and, if positive, consult PT, OT   - Has evidence of structural heart disease so will not obtain Echo.  - Syncope [place] have been associated with chest pain so will check serial troponin I.  - Consider outpatient monitoring if history suggests arrythmic cause or telemetry monitoring suggests abnormalities requiring further investigation.  - Consider Cardiology consultation if concerning arrhythmia identified during observation, if significant high-risk features or if syncope occurred during exertion and considering non-invasive stress testing  -Hold bumex, losartan and hydralazine  -Patient seems to have very poor understanding of his medications and I think on the most beneficial things from this admission would be some medication counseling    Chronic Medical Conditions:  Polypharmacy: Patient has 29 medications on his medication list.  He has very little understanding of why he is taking these medications.  He did not know if he was on a diuretic or not.  I suspect that this [place] be playing a large role in his repeat admissions.  -Benefit greatly from aggressive discontinuation of many of his medications conjunction with his PCP over weeks to months.    HFrEF: Seems hypovolemic overall.  -Hold Bumex, losartan and hydralazine  -Would restart hydralazine in the morning to avoid rebound hypertension  -Continue Imdur  ??  CAD w/ recent NSTEMI and Stent Placement: Continue ticagrelor, atorvastatin, zetia  ??  DM2: Home 40 of lantus and 15 units lispro w/ meals  - Hold semaglutide   ??  Paroxsymal Afib: Continue Coreg 25mg  BID, Continue Xareltto   ??  Depression: Continue Prozac  ??  Neuropathy: Home gabapentin dose  ??  Gout: Continue allopurinol   ??  FEN/GI: Heart Healthy diet  ??  DVT prophylaxis: Pt already on theraputic anticoagulation  ??  Code Status: Full Code  ??  DISPOSITION LIST:  [ ]  Anticipated Discharge Location: Home  [ ]  PT/OT/DME: No needs anticipated  [ ]  CM/SW needs: None anticipated  [ ]  Meds/Rx:  Not yet prescribed. No special med needs  [ ]  Teaching: None anticipated  [ ]  Follow up appt: Appt needed  [ ]  Excuse letter: None anticipated  [ ]  Transport: Private Needed  ??  ??  Admitting Provider Signature:  Santa Genera, MD  Obs 2 Attending  Division of [facility]  May 31, 2018 5:07 PM    ______________________________________________________________________  Chief Complaint: syncope      History of Present Illness:     Jeremy Route Dibella is a 48 y.o. male with PMHx CAD, STEMI (2018) complicated by cardiogenic shock following VT arrest with emergent PCI to mid LAD,??Ischemic cardiomyopathy??with ICD [facility]),??CKD, paroxysmal afib (on amio, xarelto), HTN,??HLD,??T2DM, HFrEF (EF 40-45%)  and morbid obesity??who presented to [facility] with loss of consciousness.     Patient is a poor historian therefore history is taken primarily from [facility] note today by Dr. Krista Blue.    Patient has had a complicated course of late with 2 admissions in February of this year, one for NSTEMI and the other for syncope.  During admission to cardiology (2/11-2/18/2020) for workup of NSTEMI with peak troponin at 1.09 on 2/11 he had a LHC/PCl to the proximal to mid LAD with the placement of a Synergy drug eluting stent on 2/13.  He was initially doing well after discharge however then started feeling unwell and had 2-3 episodes of syncope which resulted in him re-presenting to [facility] on 2/22.  He was admitted to the observation unit. At that time symptoms were felt to be secondary to antihypertensives and dehydration.    Patient was seen at hospital follow-up on 2/28 with no recurrence of syncope episodes.  Patient was complaining at that time of some abdominal discomfort and nausea/vomiting.  Felt to be secondary to GERD versus gastroparesis.  Amlodipine had been decreased from 10 mg to 5 mg during observation admission.  No change was made diuretic regimen.  Weight seems to have been stable and patient does not have any signs or symptoms of heart failure.    Patient presented to the [facility] today for evaluation of dry heaving and poor p.o. intake.  When Dr. Krista Blue was evaluating him patient had an episode of unresponsiveness.  Patient had just moved from the chair to the examination table.  He was put in Trendelenburg position and regained consciousness quickly (within 1 minute) without any postictal state.  No loss of bowel/bladder continence.  At that time however he did complain of stabbing chest pain.  He was transferred to the ED for further evaluation given concern that chest pain [place] have provoked syncopal episode.    In the ED patient remained asymptomatic.  He was hemodynamically stable in no acute distress.  Labs were all at baseline.  proBNP was 1600 which was in line with his previous reading.  EKG was at baseline.    When I stood him up in the ED patient had recurrence of symptoms.  Heart rate went from 80-120.  Patient was unable to stay standing long  enough for me to check blood pressure.      Allergies:   Morphine      Medications:     Prior to Admission medications    Medication Dose, Route, Frequency   acetaminophen (TYLENOL) 500 MG tablet TAKE 2 TABLETS (1 000MG ) BY MOUTH EVERY 8 HOURS AS NEEDED FOR PAIN   albuterol HFA 90 mcg/actuation inhaler Inhalation   alirocumab (PRALUENT PEN) 75 mg/mL PnIj 75 mg, Subcutaneous, Every 14 days   allopurinol (ZYLOPRIM) 100 MG tablet 50 mg, Oral, Every other day   amiodarone (PACERONE) 200 MG tablet 200 mg, Oral   atorvastatin (LIPITOR) 80 MG tablet 80 mg, Oral, Daily (standard)   blood-glucose meter kit Use to check blood sugars, #1 meter, #100 strips. Freestyle glucose meter. ICD-9 250.00   blood-glucose sensor (FREESTYLE NAVIGATOR GLUC SENS) Devi Frequency:BID   Dosage:0.0     Instructions:  Note:Dose: 1   bumetanide (BUMEX) 2 MG tablet 2 mg, Oral   carvedilol (COREG) 25 MG tablet TAKE 1 TABLET BY MOUTH WITH THE MORNING MEAL AND TAKE 2 TABLETS WITH THE EVENING MEAL   cholecalciferol, vitamin D3, 5,000 unit capsule 5,000 Units, Oral, Daily (standard)   diclofenac sodium (VOLTAREN) 1 % gel 2 g, Topical, 3 times a day PRN   empty container Misc Use as directed to dispose of injectable medications   ezetimibe (ZETIA) 10 mg tablet 10 mg, Oral   FLUoxetine (PROZAC) 20 MG capsule 60 mg, Oral, Nightly   gabapentin (NEURONTIN) 300 MG capsule 600 mg, Oral, Nightly   hydrALAZINE (APRESOLINE) 25 MG tablet 25 mg, Oral, 3 times a day (standard)   insulin ASPART (NOVOLOG FLEXPEN U-100 INSULIN) 100 unit/mL (3 mL) injection pen 15 Units, Subcutaneous, 3 times a day (with meals)   insulin detemir U-100 (LEVEMIR) 100 unit/mL (3 mL) injection pen 40 Units, Subcutaneous, Nightly   insulin syringe-needle U-100 0.3 mL 31 gauge x 5/16 Syrg Use to inject insulin twice daily with meals   isosorbide mononitrate (IMDUR) 30 MG 24 hr tablet 30 mg, Oral, Daily (standard)   lancets 30 gauge Misc USE TO CHECK BLOOD SUGAR THREE TIMES DAILY WITH MEALS   losartan (COZAAR) 50 MG tablet 50 mg, Oral, Daily (standard)   melatonin 3 mg cap 9 mg, Oral, Nightly   nitroglycerin (NITROSTAT) 0.4 MG SL tablet 0.4 mg, Sublingual, Every 5 min PRN   omeprazole (PRILOSEC) 20 MG capsule 20 mg, Oral, Daily (standard)   QUEtiapine (SEROQUEL) 50 MG tablet 50 mg, Oral, 2 times a day PRN   rivaroxaban (XARELTO) 15 mg Tab 15 mg, Oral, Daily   semaglutide 0.25 mg or 0.5 mg(2 mg/1.5 mL) PnIj 0.25 mg, Subcutaneous, Every 7 days, For 4 weeks then increase to 0.5mg  every 7 days   sevelamer (RENVELA) 800 mg tablet TAKE 2 TABLETS (1600MG ) BY MOUTH THREE TIMES DAILY WITH MEALS   ticagrelor (BRILINTA) 90 mg Tab 90 mg, Oral   traMADol (ULTRAM) 50 mg tablet 50 mg, Oral, Every 6 hours PRN   UNIFINE PENTIPS 31 gauge x 5/16 Ndle USE WITH INSULIN AND VICTOZA 5 TIMES DAILY         Past Medical History:     Past Medical History:   Diagnosis Date   ??? Angina at rest (CMS-[name])    ??? Arthritis    ??? Asthma    ??? Atrial fibrillation and flutter (CMS-[name]) 06/08/14   ??? BMI 40.0-44.9, adult (CMS-[name]) 10/29/2015   ??? Chronic anticoagulation 02/17/2018   ??? Chronic kidney disease, stage 3 (  CMS-[name])    ??? Chronic pain syndrome 04/11/2014    ~Use daily lyrica as recommended ~Tramadol as needed for pain ~Fluoxetine daily as recommended ~Daily exercise ~Eat a healthy diet ~Good control of your blood sugars can help    ??? Coronary artery disease    ??? Depression    ??? Diabetes mellitus (CMS-[name])    ??? Eye trauma 1998    glass in both eyes and removed   ??? GERD (gastroesophageal reflux disease)    ??? Gout    ??? Homeless    ??? Hyperlipidemia    ??? Hypertension    ??? Illiteracy and low-level literacy    ??? Microscopic hematuria    ??? Migraine    ??? Nephrolithiasis    ??? 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 Nonalcoholic fatty liver disease    ??? NSTEMI (non-ST elevated myocardial infarction) (CMS-[name]) 01/13/2017   ??? Obesity    ??? Obstructive sleep apnea 2009   ??? Panic attacks    ??? 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 Seizure-like activity (CMS-[name])     ~Monitor for symptoms and report them to your primary care provider if they occur    ??? Systolic CHF, chronic (CMS-[name])     EF 40-45% 2017   ??? TIA (transient ischemic attack)          Past Surgical History:      Past Surgical History:   Procedure Laterality Date   ??? CARDIAC CATHETERIZATION  03/08/2007   ??? CYSTOURETHROSCOPY  12/31/2009     Microscopic hematuria   ??? KNEE SURGERY Right 09/23/2006    arthroscopic knee surgery medial and lateral meniscus tears as well as crystal disease   ??? PR CATH PLACE/CORON ANGIO, IMG SUPER/INTERP,R&L HRT CATH, L HRT VENTRIC N/A 02/13/2017    Procedure: Left/Right Heart Catheterization;  Surgeon: Marlaine Hind, MD;  Location: [facility] CATH;  Service: Cardiology   ??? PR CATH PLACE/CORON ANGIO, IMG SUPER/INTERP,W LEFT HEART VENTRICULOGRAPHY N/A 03/17/2017    Procedure: Left Heart Catheterization W Intervention;  Surgeon: Marlaine Hind, MD;  Location: [facility] CATH;  Service: Cardiology   ??? PR CATH PLACE/CORON ANGIO, IMG SUPER/INTERP,W LEFT HEART VENTRICULOGRAPHY N/A 05/12/2018    Procedure: CATH LEFT HEART CATHETERIZATION W INTERVENTION;  Surgeon: Dorathy Kinsman, MD;  Location: [facility] CATH;  Service: Cardiology   ??? PR ECMO/ECLS INITIATION VENO-ARTERIAL N/A 03/19/2017    Procedure: ECMO/ECLS; INITIATION, VENO-ARTERIAL;  Surgeon: Lennie Odor, MD;  Location: MAIN OR [facility];  Service: Cardiac Surgery   ??? PR ECMO/ECLS RMVL PRPH CANNULA OPEN 6 YRS & OLDER Left 03/25/2017    Procedure: ECMO / ECLS PROVIDED BY PHYSICIAN; REMOVAL OF PERIPHERAL (ARTERIAL AND/OR VENOUS) CANNULA(E), OPEN, 6 YEARS AND OLDER;  Surgeon: Alonna Buckler Ikonomidis, MD;  Location: MAIN OR [facility];  Service: Cardiac Surgery   ??? PR EPHYS EVAL W/ ABLATION SUPRAVENT ARRHYTHMIA N/A 07/19/2015    Catheter ablation of cavotricuspid isthmus for atrial flutter [facility])   ??? PR INSER HART PACER XVENOUS ATRIAL N/A 05/30/2015    [employer] dual-chamber pacemaker implant (E.Chung)   ???  PR INSERT/PLACE FLOW DIRECT CATH N/A 03/17/2017    Procedure: Insert Leave In [place];  Surgeon: Marlaine Hind, MD;  Location: [facility] CATH;  Service: Cardiology   ??? PR INSJ/RPLCMT PERM DFB W/TRNSVNS LDS 1/DUAL CHMBR N/A 05/04/2017    Procedure: ICD Implant System (Single/Dual);  Surgeon: Eldred Manges, MD;  Location: [facility] CATH;  Service: Cardiology   ??? PR NEGATIVE PRESSURE WOUND THERAPY DME >50 SQ CM Left 03/25/2017    Procedure: Neg Press Wound Tx (Vac Assist) Incl Topicals, Per Session, Tsa Greater Than/= 50 Cm Squared;  Surgeon: Alonna Buckler Ikonomidis, MD;  Location: MAIN OR [facility];  Service: Cardiac Surgery   ??? PR PRQ TRLUML CORONARY STENT W/ANGIO ONE ART/BRNCH N/A 03/12/2018    Procedure: Percutaneous Coronary Intervention;  Surgeon: Marlaine Hind, MD;  Location: [facility] CATH;  Service: Cardiology   ??? PR REBL VES DIRECT,LOW EXTREM Left 03/19/2017    Procedure: Repr Bld Vessel Direct; Lower Extrem;  Surgeon: Boykin Reaper, MD;  Location: MAIN OR [facility];  Service: Vascular   ??? PR REMV ART CLOT ILIAC-POP,LEG INCIS Left 03/25/2017    Procedure: EMBOLECTOMY OR THROMBECTOMY, WITH OR WITHOUT CATHETER; FEMOROPOPLITEAL, AORTOILIAC ARTERY, BY LEG INCISION;  Surgeon: Alonna Buckler Ikonomidis, MD;  Location: MAIN OR [facility];  Service: Cardiac Surgery   ??? PR UPPER GI ENDOSCOPY,BIOPSY N/A 02/28/2016    Procedure: UGI ENDOSCOPY; WITH BIOPSY, SINGLE OR MULTIPLE;  Surgeon: Liane Comber, MD;  [facility]: [facility];  Service: Gastroenterology   ??? PR UPPER GI ENDOSCOPY,DIAGNOSIS N/A 05/07/2016    Procedure: UGI ENDO, INCLUDE ESOPHAGUS, STOMACH, & DUODENUM &/OR JEJUNUM; DX W/WO COLLECTION SPECIMN, BY BRUSH OR WASH;  Surgeon: Modena Nunnery, MD;  Location: [facility];  Service: Gastroenterology         Social History:     Social History     Socioeconomic History   ??? Marital status: Married     Spouse name: [name]   ??? Number of children: 3   ??? Years of education: 35   ??? Highest education level: Not on file   Occupational History   ??? Occupation: DISABLED     Comment: Former Surveyor, quantity Needs   ??? Financial resource strain: Very hard   ??? Food insecurity     Worry: Not on file     Inability: Not on file   ??? Transportation needs     Medical: Not on file     Non-medical: Not on file   Tobacco Use   ??? Smoking status: Never Smoker   ??? Smokeless tobacco: Current User     Types: Chew   ??? Tobacco comment: Pt dips 1 can a week   Substance and Sexual Activity   ??? Alcohol use: Never     Alcohol/week: 0.0 standard drinks     Frequency: Never     Comment: rare use: couple times a year, small amount   ??? Drug use: No   ??? Sexual activity: Yes     Partners: Female   Lifestyle   ??? Physical activity     Days per week: Not on file     Minutes per session: Not on file   ??? Stress: Not on file   Relationships   ??? Social connections     Talks on phone: More than three times a week     Gets together: More than three times a week     Attends religious service: 1 to 4 times per year  Active member of club or organization: No     Attends meetings of clubs or organizations: Never     Relationship status: Married   Other Topics Concern   ??? Do you use sunscreen? No   ??? Tanning bed use? No   ??? Are you easily burned? No   ??? Excessive sun exposure? No   ??? Blistering sunburns? No   Social History Narrative    Information updated:  01/08/16. Reviewed 08/12/17:        Born in [place]    Raised with both parents    Parents died w/in 6 mos of each other. His parents knew they were going to pass and told him just before.    Half sister and 2 half brothers        Married 30 yrs. Wife [name]    2 other children son and daughter, adopted out    Daughter Bridgette, bf DJ        Education - completed through 11th. 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    Former Presenter, broadcasting since age 21    Lives with wife, difficulty paying bills 12/30/17     Lives next door to daughter, husband and their grandson         05/19/18: pt moving to new home in next 5 days- after an eviction. Has Medicaid assistance again. Will be w/ wife [name], they babysit their grandson often.         Family History:     Family History   Problem Relation Age of Onset   ??? Diabetes Mother    ??? Hyperlipidemia Mother    ??? Heart disease Mother    ??? Basal cell carcinoma Mother    ??? Blindness Mother         diabeties   ??? Obesity Mother    ??? Hyperlipidemia Father    ??? Heart disease Father    ??? Lung disease Father    ??? Heart disease Half-Sister    ??? Kidney disease Half-Sister    ??? Gallbladder disease Half-Brother    ??? Obesity Half-Brother    ??? No Known Problems Daughter    ??? No Known Problems Son    ??? Obesity Daughter    ??? Melanoma Neg Hx    ??? Squamous cell carcinoma Neg Hx    ??? Macular degeneration Neg Hx    ??? Glaucoma Neg Hx          Review of Systems:   10 systems reviewed and are negative unless otherwise mentioned in HPI      Physical Exam:   Temp:  [37 ??C (98.6 ??F)-37.1 ??C (98.8 ??F)] 37.1 ??C (98.8 ??F)  Heart Rate:  [86-103] 86  SpO2 Pulse:  [85-99] 85  Resp:  [14-20] 20  BP: (154-196)/(85-109) 163/85  SpO2:  [92 %-100 %] 93 %    Vitals:    05/31/18 1600   BP: 163/85   Pulse: 86   Resp: 20   Temp:    SpO2: 93%     General: Alert, interactive, chronically ill-appearing.  Morbidly obese  Eyes: Anicteric sclera, conjunctiva clear.  ENT:  Nares normal, septum midline, mucosa normal, no drainage or sinus tenderness.  Lymph: No cervical or supraclavicular adenopathy.  Lungs: Normal excursion, no dullness  to percussion. Good air movement bilaterally, without wheezes or crackles. Normal upper airway sounds without evidence of stridor.  Cardiovascular: Regular rate and rhythm, S1, S2 normal, no murmur, click, rub or gallop appreciated.  Abdomen: Soft, bowel sounds are normal, liver is not enlarged, spleen is not enlarged  Musculoskeletal: No clubbing and no synovitis.  Skin: No rashes or lesions.  Neuro: No focal neurological deficits.              Labs/Studies/Diagnostics:   Labs and Studies from the last 24hrs per EMR and Reviewed

## 2018-06-01 NOTE — Unmapped (Signed)
Met with Jeremy Oliver, 48 y.o., for treatment of tobacco use/dependence.    SUMMARY: At time of counseling session, Pt was asleep. Pt's wife was in room and provided collateral information regarding Pt's tobacco use and history. This is Pt's second session with Tobacco Treatment Program and last met with Sw on 05/13/18. According to Collateral Pt is not interested in tobacco cessation and will continue to chew one can of dip a week. Collateral reports that Pt has not had cravings or had reports of withdrawal symptoms since hospitalization. Collateral reports that Pt would not be interested in NRT during his admission or discharge. SW provided Collateral with contact information, physical improvements related to tobacco cessation, and available resources (including outpatient Tobacco Treatment Program at [facility] and [place] [facility]).    Tobacco Use Treatment  Program: Hospital Inpatient  Type of Visit: Initial  Tobacco Use Treatment Visit: Talked with family member/other visitor  Goals Of Session: Assessment, Insight, increase, Communication of feelings, Problem-solving skill development, Risk behaviors, modulate, Stress management, increase, Behavior management, improve, Relapse prevention skills training    Tobacco Use During Past 30 Days  Time Since Last Tobacco Use: 1 to 7 days ago  Tobacco Withdrawal (Past 24 Hours): None noted  Type of Tobacco Products Used: Smokeless  Quantity Used: 1  Quantity Per: week    Tobacco Use History  Previously seen by NDP?: Hospital Inpatient    Behavioral Assessment  Why Uses: 1. Long standing habit 2. Enjoyment in chewing dip   Barriers/Challenges: 1. Long-standing habit 2. Stress     Treatment Plan  Cessation Meds Currently Using: None  Medications Recommended During Hospitalization: Patient declines  Outpatient/Discharge Medications Recommended: Patient declines  Patient's Plan Post Discharge/Visit: Does not plan to quit in next 6 months    TTS Information  Diagnosis: Tobacco use disorder, unspecified, uncomplicated (F17.200)  Interventions: Behavioral techniques, Assessed, Discussed, Informed, Motivational interviewing, Suggested, Supportive therapy, Relaxation Training, Psycho-education, Tx plan development  TTS Visit Length: >10 minutes    Denetta Fei Valcourt, MSW, LCSW, NCTTP  Clinical Social Worker/ Tobacco Treatment Specialist  Inpatient Tobacco Treatment Program   Phone: [phone]  Pager: [phone]

## 2018-06-02 DIAGNOSIS — N183 Chronic kidney disease, stage 3 (moderate): Principal | ICD-10-CM

## 2018-06-02 DIAGNOSIS — G4733 Obstructive sleep apnea (adult) (pediatric): Principal | ICD-10-CM

## 2018-06-02 DIAGNOSIS — E1321 Other specified diabetes mellitus with diabetic nephropathy: Principal | ICD-10-CM

## 2018-06-02 DIAGNOSIS — Z95810 Presence of automatic (implantable) cardiac defibrillator: Principal | ICD-10-CM

## 2018-06-02 DIAGNOSIS — Z6841 Body Mass Index (BMI) 40.0 and over, adult: Principal | ICD-10-CM

## 2018-06-02 DIAGNOSIS — E1122 Type 2 diabetes mellitus with diabetic chronic kidney disease: Principal | ICD-10-CM

## 2018-06-02 DIAGNOSIS — Z8673 Personal history of transient ischemic attack (TIA), and cerebral infarction without residual deficits: Principal | ICD-10-CM

## 2018-06-02 DIAGNOSIS — N200 Calculus of kidney: Principal | ICD-10-CM

## 2018-06-02 DIAGNOSIS — I480 Paroxysmal atrial fibrillation: Principal | ICD-10-CM

## 2018-06-02 DIAGNOSIS — I214 Non-ST elevation (NSTEMI) myocardial infarction: Principal | ICD-10-CM

## 2018-06-02 DIAGNOSIS — M1990 Unspecified osteoarthritis, unspecified site: Principal | ICD-10-CM

## 2018-06-02 DIAGNOSIS — Z794 Long term (current) use of insulin: Principal | ICD-10-CM

## 2018-06-02 DIAGNOSIS — F410 Panic disorder [episodic paroxysmal anxiety] without agoraphobia: Principal | ICD-10-CM

## 2018-06-02 DIAGNOSIS — R3129 Other microscopic hematuria: Principal | ICD-10-CM

## 2018-06-02 DIAGNOSIS — E1142 Type 2 diabetes mellitus with diabetic polyneuropathy: Principal | ICD-10-CM

## 2018-06-02 DIAGNOSIS — I4892 Unspecified atrial flutter: Principal | ICD-10-CM

## 2018-06-02 DIAGNOSIS — M109 Gout, unspecified: Principal | ICD-10-CM

## 2018-06-02 DIAGNOSIS — F4323 Adjustment disorder with mixed anxiety and depressed mood: Principal | ICD-10-CM

## 2018-06-02 DIAGNOSIS — Z7901 Long term (current) use of anticoagulants: Principal | ICD-10-CM

## 2018-06-02 DIAGNOSIS — E785 Hyperlipidemia, unspecified: Principal | ICD-10-CM

## 2018-06-02 DIAGNOSIS — Z955 Presence of coronary angioplasty implant and graft: Principal | ICD-10-CM

## 2018-06-02 DIAGNOSIS — K760 Fatty (change of) liver, not elsewhere classified: Principal | ICD-10-CM

## 2018-06-02 DIAGNOSIS — E119 Type 2 diabetes mellitus without complications: Principal | ICD-10-CM

## 2018-06-02 DIAGNOSIS — I130 Hypertensive heart and chronic kidney disease with heart failure and stage 1 through stage 4 chronic kidney disease, or unspecified chronic kidney disease: Principal | ICD-10-CM

## 2018-06-02 DIAGNOSIS — I4891 Unspecified atrial fibrillation: Principal | ICD-10-CM

## 2018-06-02 DIAGNOSIS — J4520 Mild intermittent asthma, uncomplicated: Principal | ICD-10-CM

## 2018-06-02 DIAGNOSIS — E875 Hyperkalemia: Principal | ICD-10-CM

## 2018-06-02 DIAGNOSIS — S0590XA Unspecified injury of unspecified eye and orbit, initial encounter: Principal | ICD-10-CM

## 2018-06-02 DIAGNOSIS — N184 Chronic kidney disease, stage 4 (severe): Principal | ICD-10-CM

## 2018-06-02 DIAGNOSIS — R569 Unspecified convulsions: Principal | ICD-10-CM

## 2018-06-02 DIAGNOSIS — I2510 Atherosclerotic heart disease of native coronary artery without angina pectoris: Principal | ICD-10-CM

## 2018-06-02 DIAGNOSIS — E669 Obesity, unspecified: Principal | ICD-10-CM

## 2018-06-02 DIAGNOSIS — Z590 Homelessness: Principal | ICD-10-CM

## 2018-06-02 DIAGNOSIS — T465X5A Adverse effect of other antihypertensive drugs, initial encounter: Principal | ICD-10-CM

## 2018-06-02 DIAGNOSIS — I952 Hypotension due to drugs: Principal | ICD-10-CM

## 2018-06-02 DIAGNOSIS — R55 Syncope and collapse: Principal | ICD-10-CM

## 2018-06-02 DIAGNOSIS — F329 Major depressive disorder, single episode, unspecified: Principal | ICD-10-CM

## 2018-06-02 DIAGNOSIS — I10 Essential (primary) hypertension: Principal | ICD-10-CM

## 2018-06-02 DIAGNOSIS — G894 Chronic pain syndrome: Principal | ICD-10-CM

## 2018-06-02 DIAGNOSIS — G459 Transient cerebral ischemic attack, unspecified: Principal | ICD-10-CM

## 2018-06-02 DIAGNOSIS — I5022 Chronic systolic (congestive) heart failure: Principal | ICD-10-CM

## 2018-06-02 DIAGNOSIS — I252 Old myocardial infarction: Principal | ICD-10-CM

## 2018-06-02 DIAGNOSIS — I208 Other forms of angina pectoris: Principal | ICD-10-CM

## 2018-06-02 DIAGNOSIS — J45909 Unspecified asthma, uncomplicated: Principal | ICD-10-CM

## 2018-06-02 DIAGNOSIS — K219 Gastro-esophageal reflux disease without esophagitis: Principal | ICD-10-CM

## 2018-06-02 DIAGNOSIS — Z550 Illiteracy and low-level literacy: Principal | ICD-10-CM

## 2018-06-02 DIAGNOSIS — G43909 Migraine, unspecified, not intractable, without status migrainosus: Principal | ICD-10-CM

## 2018-06-02 DIAGNOSIS — Z79899 Other long term (current) drug therapy: Principal | ICD-10-CM

## 2018-06-02 DIAGNOSIS — E1140 Type 2 diabetes mellitus with diabetic neuropathy, unspecified: Principal | ICD-10-CM

## 2018-06-02 LAB — BASIC METABOLIC PANEL
ANION GAP: 6 mmol/L — ABNORMAL LOW (ref 7–15)
BLOOD UREA NITROGEN: 35 mg/dL — ABNORMAL HIGH (ref 7–21)
BUN / CREAT RATIO: 10
CALCIUM: 8.2 mg/dL — ABNORMAL LOW (ref 8.5–10.2)
CO2: 23 mmol/L (ref 22.0–30.0)
CREATININE: 3.61 mg/dL — ABNORMAL HIGH (ref 0.70–1.30)
EGFR CKD-EPI AA MALE: 22 mL/min/{1.73_m2} — ABNORMAL LOW (ref >=60)
EGFR CKD-EPI NON-AA MALE: 19 mL/min/{1.73_m2} — ABNORMAL LOW (ref >=60)
EGFR CKD-EPI NON-AA MALE: 19 mL/min/1.73m2 — ABNORMAL LOW (ref >=60–107)
GLUCOSE RANDOM: 273 mg/dL — ABNORMAL HIGH (ref 70–179)
POTASSIUM: 6.3 mmol/L (ref 3.5–5.0)

## 2018-06-02 LAB — POTASSIUM: POTASSIUM: 5.6 mmol/L — ABNORMAL HIGH (ref 3.5–5.0)

## 2018-06-02 NOTE — Unmapped (Signed)
Pt has been sleeping this shift, but awakes easily, took all meds, no c/o CP since then. Pt laying in bed, wife at bed side will continue to monitor.  Problem: Adult Inpatient Plan of Care  Goal: Plan of Care Review  Outcome: Ongoing - Unchanged  Goal: Patient-Specific Goal (Individualization)  Outcome: Ongoing - Unchanged  Goal: Absence of Hospital-Acquired Illness or Injury  Outcome: Ongoing - Unchanged  Goal: Optimal Comfort and Wellbeing  Outcome: Ongoing - Unchanged  Goal: Readiness for Transition of Care  Outcome: Ongoing - Unchanged  Goal: Rounds/Family Conference  Outcome: Ongoing - Unchanged     Problem: Infection  Goal: Infection Symptom Resolution  Outcome: Ongoing - Unchanged     Problem: Fall Injury Risk  Goal: Absence of Fall and Fall-Related Injury  Outcome: Ongoing - Unchanged     Problem: Arrhythmia/Dysrhythmia (Heart Failure)  Goal: Stable Heart Rate and Rhythm  Outcome: Ongoing - Unchanged     Problem: Fluid Imbalance (Heart Failure)  Goal: Fluid Balance  Outcome: Ongoing - Unchanged     Problem: Respiratory Compromise (Heart Failure)  Goal: Effective Oxygenation and Ventilation  Outcome: Ongoing - Unchanged     Problem: Heart Failure Comorbidity  Goal: Maintenance of Heart Failure Symptom Control  Outcome: Ongoing - Unchanged     Problem: Hypertension Comorbidity  Goal: Blood Pressure in Desired Range  Outcome: Ongoing - Unchanged     Problem: Obstructive Sleep Apnea Risk or Actual (Comorbidity Management)  Goal: Unobstructed Breathing During Sleep  Outcome: Ongoing - Unchanged     Problem: Wound  Goal: Optimal Wound Healing  Outcome: Ongoing - Unchanged

## 2018-06-02 NOTE — Unmapped (Addendum)
Social Work  Psychosocial Assessment  Patient has been identified as a Runner, broadcasting/film/video.  Requested that a  timely follow-up appointment be scheduled and documented prior to discharge.  Longitudinal Plan of Care reviewed, the following information is noted for continuity of care:      Patient Name: Jeremy Oliver   Medical Record Number: [id number]   Date of Birth: [birth year]  Sex: Male     Referral  Referred by: Care Manager  Reason for Referral: Adjustment to Illness / Diagnosis / Poor Health Literacy, Complex Discharge Planning, Complex Family Dynamics / Expectations Impacting Discharge, Financial / Insurance Concerns, Patient Safety Concern Upon Discharge (hoarding, lack of caregiver support, etc.), Readmission Risk    Extended Emergency Contact Information  Primary Emergency Contact: Heinle,[name]  Address: [address] [place], [place] [address] [place]  Home Phone: [phone]  Mobile Phone: [phone]  Relation: Spouse  Secondary Emergency Contact: Tapp,Bridgette   [place]  Home Phone: [phone]  Mobile Phone: [phone]  Relation: Daughter    Legal Next of Kin / Guardian / POA / Advance Directives    [name] [facility]): Doom,[name] - [phone]    [name] [facility]): Moradi,Bridgette - Daughter - [phone]    Advance Directive (Medical Treatment)  Does patient have an advance directive covering medical treatment?: Patient does not have advance directive covering medical treatment.(Patient's designated Avon Gully is his spouse [name] [place]; Phone)  Reason patient does not have an advance directive covering medical treatment:: Patient needs follow-up to complete one.  Reason there is not a Health Care Decision Maker appointed:: Patient does not wish to appoint a Health Care Decision Maker at this time(Patient's designated Avon Gully is his spouse [name] [place]; Phone)  Information provided on advance directive:: No  Patient requests assistance:: No    Advance Directive (Mental Health Treatment)  Does patient have an advance directive covering mental health treatment?: Patient does not have advance directive covering mental health treatment.  Reason patient does not have an advance directive covering mental health treatment:: Patient does not wish to complete one at this time.    Discharge Planning  See CM Initial Transition Planning Assesssment for Discharge Planning Information.    Social Determinants of Health  Social Determinants of Health were addressed in provider documentation.  Please refer to patient history.    SW met with pt's wife and introduced self to her and explained to her SW's role in pt's discharge planning.   Social History  Support Systems: Children, Family Members, Spouse  Per pt's wife, pt reports she has other adult children and siblings, however at this time she is estranged from them. She also reports, both maternal and paternal parents are deceased. Pt's wife denied having any social or religious organization for additional support. Pt reports her daughter whom she is currently living with, is supportive allowing her and pt to live with her.         Medical and Psychiatric History  Psychosocial Stressors: Concern for non-compliance, Coping with health challenges/recent hospitalization, Family issues / concerns, Financial concerns, Housing Insecure, No cultural or religious belief system, Recent move: Psychosocial stressors to include: financial concerns, Housing insecure, relationship problems:    Surveyor, quantity Stressors: Per pt's wife, even though she is living with her daughter, this arrangement has been very stressful for her resulting in daughter and her arguing more. Per pt she reports, approx 2 wks ago  at her work at [employer] she fell and hurt knee resulting in her being unable to work. She stated workers Comp is only providing her $190 every two weeks. Pt stated, that's not nearly enough to pay my bills, we have been behind in our rent since last November 2019. We can't seem to get ahead because of my husband's health, I always have to take off from work to care for him. We owed $1200 and they wanted [place] to pay $550.00 plus the $1200 we owed, and we could not, resulting in [place] being evicted. Pt's wife told SW before her injury she was earning $667.00 every month.   SW ask pt's wife if she has spoke with [facility] DSS regarding resources for housing? Wife stated, yes, and they could not help [place] they don't even have any section 8 housing. SW provided pt's wife a Personnel officer, wife was very Adult nurse.     Housing Insecurity: Pt's wife reports her and her husband were evicted 05-12-18. Currently they are staying with her daughter at the same apartment next door from their old apartment. She added, her daughter and daughter's boyfriend along with daughter's 1 yr old son are all living there. Pt's wife did mention to SW, she has an appointment with a man from the [employer] who told her he Solly have a place for her and husband in 2 wks. Pt appeared very anxious regarding her living situation. She continued saying,I will take care of the problem with my daughter. SW ask pt if she owned a car, pt replied, yes we just got, its a 2017, we owe $360 per month including insurance, and have not paid for it so we will most likely loose are car too.     Relationship problems: Pt's wife reports she has one brother whom is a meth dealer therefore she has no contact with him, and two other sisters she refuses to speak to. Wife is concerned about the relationship between her and her daughter is becoming strained, and wants to find another place to live before her and daughter relationship deteriorates.           Psychological Issues/Information: Wife reports pt has depression and takes Prozac to treat his depression.            Outpatient Providers: Primary Care Provider Kurtis Bushman, MD Comment: (Pt has been evicted )  Ability to [employer]: Inadequate/unavailable services, Lack of resources/eligibility to obtain services, Unfamiliar with options/procedures for obtaining  Pt's current housing is insecure, pt and wife are both living at pt's daughters until they find a place of their own.  SW spoke to MD about pt returning home with [facility] services, MD agreed pt would benefit from [facility] services with PT OT RN MED SW.  SW completed a DC HM form and Care Port Referral for choice and tasked CMA to arrange for [facility] services with [facility] within 48 hrs of discharge.   Referral accepted by [facility].   Indicators for social work consultation identified.  Indicators include: Complex discharge.   Social worker consulted for psychosocial and discharge needs assessment.    CM will continue to follow for avoidable delays and opportunities for progression of care.   06/02/2018 4:26 PM   Festus Barren, MSW  Page [phone]

## 2018-06-02 NOTE — Unmapped (Signed)
I was the supervising physician in the delivery of the service. Joley Utecht D Teige Rountree, MD

## 2018-06-02 NOTE — Unmapped (Signed)
Assessment/Plan:     Principal Problem:    Syncope  Active Problems:    CAD (coronary artery disease) (RAF-[name])    Essential hypertension (RAF-[name])    Obesity, Class III, BMI 40-49.9 (morbid obesity) (CMS-[name])    Mild intermittent asthma without complication    Paroxysmal atrial fibrillation (CMS-[name])    OSA (obstructive sleep apnea)    Tobacco use disorder    Type 2 diabetes mellitus with stage 4 chronic kidney disease, with long-term current use of insulin (CMS-[name])    Cardiac pacemaker in situ    Chronic heart failure with reduced ejection fraction and diastolic dysfunction (CMS-[name])  Resolved Problems:    * No resolved hospital problems. *    Shawn Route Cassidy is a 48 y.o. male that presented to [facility] with Syncope. Likely orthostatic hypotension compounded by deconditioning and complicated by anxious affect. Head CT shows no acute intracranial process. Pacer/defibrillator evaluated at admission and was unremarkable.   He declined OS this AM but had no symptoms w/ standing.  His BPs have normalized off Bumex 2mg , Amlodipine, Hydralazine, and Losartan.  - ACS has been excluded with serial troponins and ECGs. Chest pain and pressure likely represents anxiety.  - TTE today is c/w prior WMA and stable EF 40-45%.    Hypotension. Resolved off above regimen.  Avoid IV opiates.  Continue Coreg, Imdur 30, restart diuretic today.   Restart Losartan at 25mg  next once K+ normalizes.    Hyperkalemia. Developed in the afternoon, delayed phlebotomy draw due to difficult with blood draw. Likely a result of CKD and having held home diuretic due to hypotension.  - Lasix 80 IV, Albuterol 5mg  nebulizer to temporize.  - ECG is stable without interval changes so that Calcium IV is deferred.   - Recheck in PM and give SPS if still >6.0     Somnolence. resolved Likely delayed clearance of Dilaudid with CKD.  Avoid further IV opiates.  - Continue reduced home medications: Gabapentin 300 qhs, seroquel 25mg  PO PRN.    PAFib. Has implanted atrial pacer+defibrillatorm currently NSR.  CAD. Most recent intervention: 2/13 with DES to proximal to mid LAD, balloon angioplasty of diagonal.   - Continue Brillinta, Xarelto 15, Atorva 80, Zetia 10, Imdur 30, Coreg 25 bid.  - Holding Bumex, Losartan, Hydralazine.  - Continue Amiodarone 200. LFTs, TSH are normal this admission.    CKDIV. B/l Creat appears to be 3.3  Continue Renvela 1600 tid for hyperphosphatemia.  Check Phos today.    HFrEF. stable  Was initially hypovolemic but now mildly hypervolemic and needing IV diuresis.    DMII, not well controlled.   Continue home Lantus 40, Lispro 15u meals. Hold GLP1 agonist.  Reduce gabapentin dose to 300 qhs.    Anxiety/depression.  Continue prozac 60, seroquel PRN.      Ronni Rumble [facility]  06/02/18  OBS1 Attending  ______________________________________________________________________    Labs/Studies/Diagnostics:   Labs and Studies from the last 24hrs per EMR and Reviewed  __________________________________________________________________    Subjective:    Pt slept most of the night. Was able to stand with walker and wife assistance and ambulate to the restroom without dizziness, lightheadedness. Tremulous at times when discussing home life. Ate half of a heavy breakfast of eggs, sausage before feeling nausea. No emesis last night into today.  He and his spouse believe that the GI cocktail he received yesterday evening were helpful to his symptoms.     He feels improved but reports lots of joint ache especially  the right ankle that feels similar to previous gout flares.   He is motivated to leave the hospital due to family stuff. He denies CP or DOE when getting to the bathroom.   Had a small, normal BM this morning.    He and his spouse won't elaborate on their home life but feel comfortable returning home with his adult daughter. Care management involved.    Objective:  Vital signs in last 24 hours:  Temp:  [36.3 ??C (97.4 ??F)-37.5 ??C (99.5 ??F)] 37.4 ??C (99.4 ??F) Heart Rate:  [74-96] 96  Resp:  [18-20] 19  BP: (114-149)/(54-84) 137/80  MAP (mmHg):  [79-94] 81  SpO2:  [75 %-100 %] 93 %    Intake/Output last 3 shifts:  I/O last 3 completed shifts:  In: 980 [P.O.:480; IV Piggyback:500]  Out: 1610 [RUEAV:4098; Emesis/NG output:100]    Constitutional: awake and alert today.  ENT: Mucous membranes moist. EOMI, pupils are equal and reactive.  Neck: Supple. No JVD.   Respiratory: Lungs CTAB. Normal work of breathing.  Cardiovascular: muffled due to chest wall interference. Normal rate, regular rhythm. No murmurs, rubs, or gallops. LE edema just above ankles is now 2+, pitting.  Abdomen: Soft, non-tender, non-distended. Bowel sounds present. Obese.  Skin: No rashes or lesions.  Musculoskeletal: Strength 5/5 in all extremities.  Psychiatric: Oriented to self, place, and time once awakened.

## 2018-06-02 NOTE — Unmapped (Signed)
Pt is A/Ox4 and cooperative with care. Denies pain. Repositoned self in bed and no skin breakdown noted. ECG done because of high potassium. Lasix and albuterol given per order. Bed in low position and call bell in reach. Safety maintained. Will continue to monitor.

## 2018-06-02 NOTE — Unmapped (Signed)
PHYSICAL THERAPY  Evaluation (06/02/18 1402)     Patient Name:  Jeremy Oliver       Medical Record Number: [id number]   Date of Birth: [birth year]  Sex: Male            Treatment Diagnosis: Decreased functional strength and activity tolerance     ASSESSMENT  Problem List: Decreased strength;Obesity;Decreased endurance;Decreased mobility;Impaired ADLs;Fall Risk     Assessment : Jeremy Route Mendosa is a 48 y.o. male with PMHx CAD, STEMI (2018) complicated by cardiogenic shock following VT arrest with emergent PCI to mid LAD, Ischemic cardiomyopathy with ICD Conservation officer, historic buildings), CKD, paroxysmal afib (on amio, xarelto), HTN, HLD, T2DM, HFrEF (EF 40-45%)  and morbid obesity who presented to [facility] with loss of consciousness. Pt presents to PT with significant functional strength deficits - unable to transfer OOB due to weakness. Pt fatigues very easily with activity and is further limited by obesity. Pt has many stairs to navigate home and does not have adequate caregiver support considering high level of assist required at this time. Pt will benefit from 5x/week low intensity PT f/u post-d/c. Pt will continue to benefit from skilled acute PT to promote optimal functional outcomes. After a review of the personal factors, comorbidities, clinical presentation, and examination of the number of affected body systems, the patient presents as a moderate complexity case.     Today's Interventions: Eval, theract, education on PT POC, improtance of upright positioning, progressing mobility, HEP - 3x10 LAQ, SLR, GS, AP                           PLAN  Planned Frequency of Treatment:  1-2x per day for: 4-5x week      Planned Interventions: Balance activities;Education - Patient;Education - Family / caregiver;Endurance activities;Functional mobility;Gait training;Home exercise program;Postural re-education;Self-care / Home training;Stair training;Therapeutic exercise;Therapeutic activity;Transfer training    Post-Discharge Physical Therapy Recommendations:  5x weekly;Low intensity    PT DME Recommendations: Defer to post acute           Goals:   Patient and Family Goals: Go home     Long Term Goal #1: TBD mobility assessment        SHORT GOAL #1: Pt will be indep with supine<>sit               Time Frame : 2 weeks  SHORT GOAL #2: Pt will be indep with HEP              Time Frame : 2 weeks  SHORT GOAL #3: Pt will participate in OOB mobility assessment               Time Frame : 1 week                                        Prognosis:  Fair  Positive Indicators: Participation   Barriers to Discharge: Inability to safely perform ADLS;Endurance deficits;Functional strength deficits;Obesity;Inaccessible home environment    SUBJECTIVE  Patient reports: Pt agrees to PT  Current Functional Status: Pt found sidelying, left EOB with all needs in reach - RN aware      Prior Functional Status: Pt reports he is typically indep using either cane or RW for ambulation - primarily cane. Pt reports falls from syncopal episodes. Reports he needs assist with bed mobility from wife most mornings to  get out of bed.   Equipment available at home: [employer];Shower chair;Bedside commode;Wheelchair-manual     Past Medical History:   Diagnosis Date   ??? Angina at rest (CMS-[name])    ??? Arthritis    ??? Asthma    ??? Atrial fibrillation and flutter (CMS-[name]) 06/08/14   ??? BMI 40.0-44.9, adult (CMS-[name]) 10/29/2015   ??? Chronic anticoagulation 02/17/2018   ??? Chronic kidney disease, stage 3 (CMS-[name])    ??? Chronic pain syndrome 04/11/2014    ~Use daily lyrica as recommended ~Tramadol as needed for pain ~Fluoxetine daily as recommended ~Daily exercise ~Eat a healthy diet ~Good control of your blood sugars can help    ??? Coronary artery disease    ??? Depression    ??? Diabetes mellitus (CMS-[name])    ??? Eye trauma 1998    glass in both eyes and removed   ??? GERD (gastroesophageal reflux disease)    ??? Gout    ??? Homeless    ??? Hyperlipidemia    ??? Hypertension    ??? Illiteracy and low-level literacy    ??? Microscopic hematuria    ??? Migraine    ??? Nephrolithiasis    ??? 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 Nonalcoholic fatty liver disease    ??? NSTEMI (non-ST elevated myocardial infarction) (CMS-[name]) 01/13/2017   ??? Obesity    ??? Obstructive sleep apnea 2009   ??? Panic attacks    ??? 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 Seizure-like activity (CMS-[name])     ~Monitor for symptoms and report them to your primary care provider if they occur    ??? Systolic CHF, chronic (CMS-[name])     EF 40-45% 2017   ??? TIA (transient ischemic attack)     Social History     Tobacco Use   ??? Smoking status: Never Smoker   ??? Smokeless tobacco: Current User     Types: Chew   ??? Tobacco comment: Pt dips 1 can a week, Pt not interested in quitting    Substance Use Topics   ??? Alcohol use: Never     Alcohol/week: 0.0 standard drinks     Frequency: Never     Comment: rare use: couple times a year, small amount      Past Surgical History:   Procedure Laterality Date   ??? CARDIAC CATHETERIZATION  03/08/2007   ??? CYSTOURETHROSCOPY  12/31/2009     Microscopic hematuria   ??? KNEE SURGERY Right 09/23/2006    arthroscopic knee surgery medial and lateral meniscus tears as well as crystal disease   ??? PR CATH PLACE/CORON ANGIO, IMG SUPER/INTERP,R&L HRT CATH, L HRT VENTRIC N/A 02/13/2017    Procedure: Left/Right Heart Catheterization;  Surgeon: Marlaine Hind, MD;  Location: [facility] CATH;  Service: Cardiology   ??? PR CATH PLACE/CORON ANGIO, IMG SUPER/INTERP,W LEFT HEART VENTRICULOGRAPHY N/A 03/17/2017    Procedure: Left Heart Catheterization W Intervention;  Surgeon: Marlaine Hind, MD;  Location: [facility] CATH;  Service: Cardiology   ??? PR CATH PLACE/CORON ANGIO, IMG SUPER/INTERP,W LEFT HEART VENTRICULOGRAPHY N/A 05/12/2018    Procedure:  CATH LEFT HEART CATHETERIZATION W INTERVENTION;  Surgeon: Dorathy Kinsman, MD;  Location: [facility] CATH;  Service: Cardiology   ??? PR ECMO/ECLS INITIATION VENO-ARTERIAL N/A 03/19/2017    Procedure: ECMO/ECLS; INITIATION, VENO-ARTERIAL;  Surgeon: Lennie Odor, MD;  Location: MAIN OR [facility];  Service: Cardiac Surgery   ??? PR ECMO/ECLS RMVL PRPH CANNULA OPEN 6 YRS & OLDER Left 03/25/2017    Procedure: ECMO / ECLS PROVIDED BY PHYSICIAN; REMOVAL OF PERIPHERAL (ARTERIAL AND/OR VENOUS) CANNULA(E), OPEN, 6 YEARS AND OLDER;  Surgeon: Alonna Buckler Ikonomidis, MD;  Location: MAIN OR [facility];  Service: Cardiac Surgery   ??? PR EPHYS EVAL W/ ABLATION SUPRAVENT ARRHYTHMIA N/A 07/19/2015    Catheter ablation of cavotricuspid isthmus for atrial flutter [facility])   ??? PR INSER HART PACER XVENOUS ATRIAL N/A 05/30/2015    [employer] dual-chamber pacemaker implant (E.Chung)   ??? PR INSERT/PLACE FLOW DIRECT CATH N/A 03/17/2017    Procedure: Insert Leave In [place];  Surgeon: Marlaine Hind, MD;  Location: [facility] CATH;  Service: Cardiology   ??? PR INSJ/RPLCMT PERM DFB W/TRNSVNS LDS 1/DUAL CHMBR N/A 05/04/2017    Procedure: ICD Implant System (Single/Dual);  Surgeon: Eldred Manges, MD;  Location: [facility] CATH;  Service: Cardiology   ??? PR NEGATIVE PRESSURE WOUND THERAPY DME >50 SQ CM Left 03/25/2017    Procedure: Neg Press Wound Tx (Vac Assist) Incl Topicals, Per Session, Tsa Greater Than/= 50 Cm Squared;  Surgeon: Alonna Buckler Ikonomidis, MD;  Location: MAIN OR [facility];  Service: Cardiac Surgery   ??? PR PRQ TRLUML CORONARY STENT W/ANGIO ONE ART/BRNCH N/A 03/12/2018    Procedure: Percutaneous Coronary Intervention;  Surgeon: Marlaine Hind, MD;  Location: [facility] CATH;  Service: Cardiology   ??? PR REBL VES DIRECT,LOW EXTREM Left 03/19/2017    Procedure: Repr Bld Vessel Direct; Lower Extrem;  Surgeon: Boykin Reaper, MD;  Location: MAIN OR [facility];  Service: Vascular   ??? PR REMV ART CLOT ILIAC-POP,LEG INCIS Left 03/25/2017    Procedure: EMBOLECTOMY OR THROMBECTOMY, WITH OR WITHOUT CATHETER; FEMOROPOPLITEAL, AORTOILIAC ARTERY, BY LEG INCISION;  Surgeon: Alonna Buckler Ikonomidis, MD;  Location: MAIN OR [facility];  Service: Cardiac Surgery   ??? PR UPPER GI ENDOSCOPY,BIOPSY N/A 02/28/2016    Procedure: UGI ENDOSCOPY; WITH BIOPSY, SINGLE OR MULTIPLE;  Surgeon: Liane Comber, MD;  [facility];  Service: Gastroenterology   ??? PR UPPER GI ENDOSCOPY,DIAGNOSIS N/A 05/07/2016    Procedure: UGI ENDO, INCLUDE ESOPHAGUS, STOMACH, & DUODENUM &/OR JEJUNUM; DX W/WO COLLECTION SPECIMN, BY BRUSH OR WASH;  Surgeon: Modena Nunnery, MD;  [facility]: [facility];  Service: Gastroenterology    Family History   Problem Relation Age of Onset   ??? Diabetes Mother    ??? Hyperlipidemia Mother    ??? Heart disease Mother    ??? Basal cell carcinoma Mother    ??? Blindness Mother         diabeties   ??? Obesity Mother    ??? Hyperlipidemia Father    ??? Heart disease Father    ??? Lung disease Father    ??? Heart disease Half-Sister    ??? Kidney disease Half-Sister    ??? Gallbladder disease Half-Brother    ??? Obesity Half-Brother    ??? No Known Problems Daughter    ??? No Known Problems Son    ??? Obesity Daughter    ??? Melanoma Neg Hx    ??? Squamous cell carcinoma Neg Hx    ??? Macular degeneration Neg Hx    ???  Glaucoma Neg Hx         Allergies: Morphine                Objective Findings  Precautions / Restrictions  Precautions: Falls precautions  Weight Bearing Status: Non-applicable  Required Braces or Orthoses: LLE AFO - Ankle-foot orthoses    Communication Preference: Verbal   Pain Comments: Reports soreness in muscles and joints   Medical Tests / Procedures: EMR reviewed   Equipment / Environment: Vascular access (PIV, TLC, Port-a-cath, PICC);Telemetry    At Rest: VSS  With Activity: VSS  Orthostatics: Asymptomatic        Living Situation  Living Environment: House  Lives With: Spouse;Daughter;Son(daughter and son-in-law)  Home Living: Two level home;Bed/bath upstairs;Stairs to alternate level with rails;Walk-in shower;Standard height toilet;Stairs to enter with rails  Rail placement (outside): Bilateral rails  Rail placement (inside): Bilateral rails  Number of Stairs: 10(4 outside)     Cognition: A&Ox4  Visual / Perception Status: Denies visual changes        UE ROM: WFL  UE Strength: WFL  LE ROM: WFL  LE Strength: Poor functional strength - unable to stand with mod assist provided                        Sensation: Pt reports numbness below L knee - baseline   Balance: Pt with good sitting balance - indep unsupported          Bed Mobility: Pt requiring min assist for supine to sit with HOB slightly elevated  Transfers: Pt attempted standing 5x with mdo assist and RW - pt unable. Pt given assistance with donning shoes and brace. Pt given cues for scooting to edge, pushign with UEs from bed, leaning anteriorly, keeping feet shoulder-width apart and tucked under BOS. Assistance with rockign anteriorly    Gait  Gait: NT  Stairs: NT      Endurance: Poor - fatigues easily     Physical Therapy Session Duration  PT Individual - Duration: 27    Medical Staff Made Aware: RN aware     I attest that I have reviewed the above information.  Signed: Vicenta Dunning, PT  Filed 06/02/2018

## 2018-06-02 NOTE — Unmapped (Addendum)
To Outpatient Provider:      Hospital Course:  89 Male w/ PMHX of CAD, HFrEF, CKDIV, asthma, severe obesity, OSA, paroxysmal Afib s/p defibrillator, atrial pacemaker who had NSTEMI resulting in PCI 05/2018 followed by syncope admission 2/22 - 2/23 in Observation for syncope.  During that admission, patient was evaluated by cardiology for syncope and the net result was that this was most likely polypharmacy and possibly orthostatic hypotension.  The net result was that his amlodipine was half to 5 mg and the patient was instructed to keep a blood pressure log and follow-up with cardiologist.     He represented on 05/31/2018 due to syncope which was felt to be polypharmacy and deconditioning.  His pacer/ICD was interrogated and found to be functioning properly; he was rate controlled and ACS was excluded with serial EKGs and troponins.  The night of admission, he had a witnessed syncopal event shortly after complaining of severe headache but had a nonfocal neurologic exam.  Patient had other episodes of syncope while admitted.  He was found to be hypotensive during one of these episodes.  Syncope was thought to be possibly due to hydralazine causing hypotension, so hydralazine was discontinued on discharge.      It is also very likely that the patient psychosocial situation and severe medical comorbidities are causing an adjustment reaction.  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   For future nausea/vomiting, acute anxiety, headaches patient was encouraged to take 25 mg of Seroquel to try to make the symptoms abate.    Patient remained in observation the following day and was somnolent but arousable for nearly 24 hours.  His blood pressures were soft without frank hypotension but rebounded by the next day.  By the morning of 3/4, the patient was ambulating independently with a walker, eating a regular diet and was alert and oriented and ready to leave the hospital.  However, a BMP revealed that his potassium was 6.3 compared to 5.0 the day before with holding his Bumex for 1 day.  He was given IV Lasix and albuterol and the potassium improved to 5.6; however, it increased to 6.0 on 3/5.  He was given his home bumex and potassium decreased to 5.3.  Repeat potassium on the night of 3/5 was ***.

## 2018-06-02 NOTE — Unmapped (Signed)
Pt updated regarding care plan. Head to toe assessment completed.  Medications administered per [facility] order.  Call bell within reach, bed in low position, wheels locked, alarm on.  Vitals unremarkable.  Pt had one of his episodes during the night, he recovered very quickly.  No other needs/issues noted.  Will continue to monitor.     Problem: Adult Inpatient Plan of Care  Goal: Plan of Care Review  Outcome: Progressing  Goal: Patient-Specific Goal (Individualization)  Outcome: Progressing  Flowsheets (Taken 06/02/2018 0322)  Patient-Specific Goals (Include Timeframe): Improve symptoms by 3/5  Goal: Absence of Hospital-Acquired Illness or Injury  Outcome: Progressing  Goal: Optimal Comfort and Wellbeing  Outcome: Progressing  Goal: Readiness for Transition of Care  Outcome: Progressing  Goal: Rounds/Family Conference  Outcome: Progressing     Problem: Infection  Goal: Infection Symptom Resolution  Outcome: Progressing     Problem: Fall Injury Risk  Goal: Absence of Fall and Fall-Related Injury  Outcome: Progressing     Problem: Arrhythmia/Dysrhythmia (Heart Failure)  Goal: Stable Heart Rate and Rhythm  Outcome: Progressing     Problem: Fluid Imbalance (Heart Failure)  Goal: Fluid Balance  Outcome: Progressing     Problem: Respiratory Compromise (Heart Failure)  Goal: Effective Oxygenation and Ventilation  Outcome: Progressing     Problem: Heart Failure Comorbidity  Goal: Maintenance of Heart Failure Symptom Control  Outcome: Progressing     Problem: Hypertension Comorbidity  Goal: Blood Pressure in Desired Range  Outcome: Progressing     Problem: Obstructive Sleep Apnea Risk or Actual (Comorbidity Management)  Goal: Unobstructed Breathing During Sleep  Outcome: Progressing     Problem: Wound  Goal: Optimal Wound Healing  Outcome: Progressing

## 2018-06-03 LAB — BASIC METABOLIC PANEL
ANION GAP: 11 mmol/L (ref 7–15)
ANION GAP: 11 mmol/L — ABNORMAL HIGH (ref 7–15)
BLOOD UREA NITROGEN: 38 mg/dL — ABNORMAL HIGH (ref 7–21)
BUN / CREAT RATIO: 10
CALCIUM: 8.9 mg/dL (ref 8.5–10.2)
CO2: 21 mmol/L — ABNORMAL LOW (ref 22.0–30.0)
CREATININE: 3.83 mg/dL — ABNORMAL HIGH (ref 0.70–1.30)
EGFR CKD-EPI AA MALE: 20 mL/min/{1.73_m2} — ABNORMAL LOW (ref >=60)
EGFR CKD-EPI NON-AA MALE: 17 mL/min/{1.73_m2} — ABNORMAL LOW (ref >=60)
GLUCOSE RANDOM: 223 mg/dL — ABNORMAL HIGH (ref 70–179)
POTASSIUM: 6 mmol/L — ABNORMAL HIGH (ref 3.5–5.0)

## 2018-06-03 LAB — POTASSIUM: POTASSIUM: 5.3 mmol/L — ABNORMAL HIGH (ref 3.5–5.0)

## 2018-06-03 MED ORDER — ELIQUIS 5 MG TABLET
ORAL_TABLET | Freq: Two times a day (BID) | ORAL | 0 refills | 0.00000 days
Start: 2018-06-03 — End: 2018-06-03

## 2018-06-03 MED ORDER — GABAPENTIN 300 MG CAPSULE
ORAL_CAPSULE | Freq: Every evening | ORAL | 0 refills | 0.00000 days | Status: CP
Start: 2018-06-03 — End: 2018-06-22
  Filled 2018-06-04: qty 30, 30d supply, fill #0

## 2018-06-03 MED ORDER — POLYETHYLENE GLYCOL 3350 17 GRAM ORAL POWDER PACKET
PACK | Freq: Two times a day (BID) | ORAL | 0 refills | 0.00000 days | Status: CP
Start: 2018-06-03 — End: 2018-07-03
  Filled 2018-06-04: qty 14, 7d supply, fill #0

## 2018-06-03 MED ORDER — BLOOD SUGAR DIAGNOSTIC STRIPS
3 refills | 0.00000 days | Status: CP
Start: 2018-06-03 — End: 2018-06-04

## 2018-06-03 NOTE — Unmapped (Signed)
Patient has refused cpap ?

## 2018-06-03 NOTE — Unmapped (Signed)
Pt. Constantly complaining of gout pain in BUE and BLE. Unable to get a shower chair so that pt. Could take a hot shower. MD contacted and spoke with pt. About pain control. Administered steroids and ordered gout medication. Pt. Then requested medication for sleep. MD ordered melatonin, which was effective. Blood glucose levels checked AC/HS and sliding scale insulin administered as ordered. Pt. Requests that only his wife be given information. See password. Xeralto for VTE. Lactulose ordered to reduce K+, but pt. Refused to take it. Brace for foot drop. Possible discharge today.   Problem: Adult Inpatient Plan of Care  Goal: Plan of Care Review  Outcome: Progressing  Goal: Patient-Specific Goal (Individualization)  Outcome: Progressing  Flowsheets (Taken 06/03/2018 0626)  Patient-Specific Goals (Include Timeframe): Pt. will have tolerable pain from 7p-7a.  Individualized Care Needs: Pain control, telemetry, continuous pulsox, AC/HS, Xeralto, Ortho BP, fall precautions.  Anxieties, Fears or Concerns: Pt. is concerned about pain.  Goal: Absence of Hospital-Acquired Illness or Injury  Outcome: Progressing  Intervention: Identify and Manage Fall Risk  Flowsheets (Taken 06/03/2018 0626)  Safety Interventions:   fall reduction program maintained   bed alarm  Intervention: Prevent Skin Injury  Flowsheets (Taken 06/03/2018 0626)  Pressure Reduction Techniques: frequent weight shift encouraged  Intervention: Prevent VTE (venous thromboembolism)  Flowsheets (Taken 06/03/2018 0626)  VTE Prevention/Management: anticoagulation therapy  Intervention: Prevent Infection  Flowsheets (Taken 06/03/2018 6045)  Infection Prevention: handwashing promoted  Goal: Optimal Comfort and Wellbeing  Outcome: Progressing  Intervention: Monitor Pain and Promote Comfort  Flowsheets (Taken 06/03/2018 0626)  Pain Management Interventions:   care clustered   pain management plan reviewed with patient/caregiver   quiet environment facilitated  Intervention: Provide Person-Centered Care  Flowsheets (Taken 06/03/2018 0626)  Trust Relationship/Rapport:   care explained   empathic listening provided   reassurance provided   thoughts/feelings acknowledged   questions answered   choices provided   emotional support provided   questions encouraged  Goal: Readiness for Transition of Care  Outcome: Progressing  Goal: Rounds/Family Conference  Outcome: Progressing     Problem: Infection  Goal: Infection Symptom Resolution  Outcome: Progressing  Intervention: Prevent or Manage Infection  Flowsheets (Taken 06/03/2018 0626)  Fever Reduction/Comfort Measures: medication administered     Problem: Fall Injury Risk  Goal: Absence of Fall and Fall-Related Injury  Outcome: Progressing  Intervention: Identify and Manage Contributors to Fall Injury Risk  Flowsheets (Taken 06/03/2018 0626)  Medication Review/Management: medications reviewed  Self-Care Promotion: BADL personal objects within reach  Intervention: Promote Injury-Free Environment  Flowsheets (Taken 06/03/2018 [phone])  Safety Interventions:   fall reduction program maintained   bed alarm  Environmental Safety Modification: clutter free environment maintained     Problem: Heart Failure Comorbidity  Goal: Maintenance of Heart Failure Symptom Control  Outcome: Progressing  Intervention: Maintain Heart Failure-Management Strategies  Flowsheets (Taken 06/03/2018 0626)  Medication Review/Management: medications reviewed     Problem: Hypertension Comorbidity  Goal: Blood Pressure in Desired Range  Outcome: Progressing  Intervention: Maintain Hypertension-Management Strategies  Flowsheets (Taken 06/03/2018 1191)  Syncope Management: position changed slowly  Medication Review/Management: medications reviewed
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
## 2018-06-03 NOTE — Unmapped (Signed)
Daily Progress Note    Assessment/Plan:    Principal Problem:    Syncope  Active Problems:    CAD (coronary artery disease) (RAF-[name])    Essential hypertension (RAF-[name])    Obesity, Class III, BMI 40-49.9 (morbid obesity) (CMS-[name])    Mild intermittent asthma without complication    Paroxysmal atrial fibrillation (CMS-[name])    OSA (obstructive sleep apnea)    Tobacco use disorder    Type 2 diabetes mellitus with stage 4 chronic kidney disease, with long-term current use of insulin (CMS-[name])    Cardiac pacemaker in situ    Chronic heart failure with reduced ejection fraction and diastolic dysfunction (CMS-[name])  Resolved Problems:    * No resolved hospital problems. *             Jeremy Oliver is a 48 y.o. male who presented to [facility] with Syncope.    Hyperkalemia. Most likely 2/2 to CKD and having held home diuretic due to hypotension.    - S/p lasix 80 IV  - Recheck potassium at 1800    Syncope. Likely orthostatic hypotension compounded by deconditioning and complicated by anxious affect. Head CT shows no acute intracranial process. Pacer/defibrillator evaluated at admission and was unremarkable. ACS has been excluded with serial troponins and ECGs.  ACS has been excluded with serial troponins and ECGs  - CTM  ??  Hypotension. Resolved while holding anti-hypertensive medications.  -Continue Coreg and Imdur , holding losartan and hydralazine  ??  Somnolence. Resolved, most likely 2/2 to delayed clearance of Dilaudid with CKD.  - Continue reduced home medications: Gabapentin 300 qhs, seroquel 25mg  PO PRN.  ??  PAFib. Has implanted atrial pacer+defibrillatorm currently NSR.  - Continue Amiodarone 200    CAD. 2/13: DES to proximal to mid LAD, balloon angioplasty of diagonal.   - Continue Brillinta, Xarelto 15, Atorva 80, Zetia 10, Imdur 30, Coreg 25 bid.  - Holding Bumex, Losartan, Hydralazine.  ??  CKDIV. Baseline Creat ~ 3.3  - Continue Renvela 1600 tid for hyperphosphatemia.  ??  DMII :Continue home Lantus 40, Lispro 15u meals. Hold GLP1 agonist.  ??  Anxiety/depression :Continue prozac 60, seroquel PRN.  ___________________________________________________________________    Subjective:  Patient denies chest pain or shortness of breath.  He reports that his dizziness has improved.  He was initially planned to discharge today but afternoon potassium was elevated and he was upgraded to inpatient status for hyperkalemia.      Labs/Studies:  Labs and Studies from the last 24hrs per EMR and Reviewed and   All lab results last 24 hours:    Recent Results (from the past 24 hour(s))   ECG 12 Lead    Collection Time: 06/01/18  5:07 PM   Result Value Ref Range    EKG Systolic BP  mmHg    EKG Diastolic BP  mmHg    EKG Ventricular Rate 70 BPM    EKG Atrial Rate 70 BPM    EKG P-R Interval 252 ms    EKG QRS Duration 94 ms    EKG Q-T Interval 422 ms    EKG QTC Calculation 455 ms    EKG Calculated P Axis 51 degrees    EKG Calculated R Axis 8 degrees    EKG Calculated T Axis 52 degrees    QTC Fredericia 444 ms   POCT Glucose    Collection Time: 06/01/18  5:50 PM   Result Value Ref Range    Glucose, POC 184 (H) 70 - 179  mg/dL   POCT Glucose    Collection Time: 06/01/18  7:54 PM   Result Value Ref Range    Glucose, POC 135 70 - 179 mg/dL   POCT Glucose    Collection Time: 06/02/18  7:14 AM   Result Value Ref Range    Glucose, POC 232 (H) 70 - 179 mg/dL   Echocardiogram Follow Up/Limited Echo With Contrast    Collection Time: 06/02/18  9:10 AM   Result Value Ref Range    LV Diastolic Diameter PLAX 5.0 cm    LV Systolic Diameter PLAX 4.1 cm    IVS Diastolic Thickness 1.2 cm    LVPW Diastolic Thickness 1.2 cm    Aortic Root Diameter 3.2 cm    LA Systolic Diameter LX 4.1 cm   Basic Metabolic Panel    Collection Time: 06/02/18  1:07 PM   Result Value Ref Range    Sodium 134 (L) 135 - 145 mmol/L    Potassium 6.3 (HH) 3.5 - 5.0 mmol/L    Chloride 105 98 - 107 mmol/L    CO2 23.0 22.0 - 30.0 mmol/L    Anion Gap 6 (L) 7 - 15 mmol/L    BUN 35 (H) 7 - 21 mg/dL Creatinine 1.61 (H) 0.96 - 1.30 mg/dL    BUN/Creatinine Ratio 10     EGFR CKD-EPI Non-African American, Male 19 (L) >=60 mL/min/1.52m2    EGFR CKD-EPI African American, Male 22 (L) >=60 mL/min/1.56m2    Glucose 273 (H) 70 - 179 mg/dL    Calcium 8.2 (L) 8.5 - 10.2 mg/dL   Magnesium Level    Collection Time: 06/02/18  1:07 PM   Result Value Ref Range    Magnesium 1.8 1.6 - 2.2 mg/dL   POCT Glucose    Collection Time: 06/02/18  1:29 PM   Result Value Ref Range    Glucose, POC 247 (H) 70 - 179 mg/dL       Objective:  BP 045/40  - Pulse 90  - Temp 37 ??C (Oral)  - Resp 18  - Ht 188 cm (6' 2)  - Wt (!) 153.2 kg (337 lb 11.9 oz)  - SpO2 98%  - BMI 43.36 kg/m??     General: A&O, NAD, lying in bed  Cv: RRR, no m/r/g  Resp: CTAB, non-labored, symmetrical chest wall expansion  GI: Soft, non-tender, protuberant abdomen, + BS  MSK: +2 pitting edema  Psych: Cooperative, appropriate mood and affect
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
## 2018-06-04 ENCOUNTER — Ambulatory Visit: Admit: 2018-06-04 | Discharge: 2018-06-05 | Payer: MEDICARE

## 2018-06-04 DIAGNOSIS — N184 Chronic kidney disease, stage 4 (severe): Principal | ICD-10-CM

## 2018-06-04 DIAGNOSIS — Z09 Encounter for follow-up examination after completed treatment for conditions other than malignant neoplasm: Principal | ICD-10-CM

## 2018-06-04 DIAGNOSIS — E875 Hyperkalemia: Principal | ICD-10-CM

## 2018-06-04 DIAGNOSIS — E1122 Type 2 diabetes mellitus with diabetic chronic kidney disease: Principal | ICD-10-CM

## 2018-06-04 DIAGNOSIS — Z794 Long term (current) use of insulin: Principal | ICD-10-CM

## 2018-06-04 DIAGNOSIS — I2510 Atherosclerotic heart disease of native coronary artery without angina pectoris: Principal | ICD-10-CM

## 2018-06-04 LAB — BASIC METABOLIC PANEL
ANION GAP: 16 mmol/L — ABNORMAL HIGH (ref 7–15)
BLOOD UREA NITROGEN: 46 mg/dL — ABNORMAL HIGH (ref 7–21)
BUN / CREAT RATIO: 13
CALCIUM: 9.1 mg/dL (ref 8.5–10.2)
CO2: 23 mmol/L (ref 22.0–30.0)
CREATININE: 3.51 mg/dL — ABNORMAL HIGH (ref 0.70–1.30)
CREATININE: 3.51 mg/dL — ABNORMAL HIGH (ref 0.70–1.30)
EGFR CKD-EPI AA MALE: 22 mL/min/{1.73_m2} — ABNORMAL LOW (ref >=60)
EGFR CKD-EPI NON-AA MALE: 19 mL/min/{1.73_m2} — ABNORMAL LOW (ref >=60)
GLUCOSE RANDOM: 254 mg/dL — ABNORMAL HIGH (ref 65–179)
POTASSIUM: 4.8 mmol/L (ref 3.5–5.0)
SODIUM: 140 mmol/L (ref 135–145)

## 2018-06-04 MED ORDER — INSULIN DETEMIR (U-100) 100 UNIT/ML (3 ML) SUBCUTANEOUS PEN
Freq: Every evening | SUBCUTANEOUS | 0 refills | 0.00000 days | Status: CP
Start: 2018-06-04 — End: 2018-06-22
  Filled 2018-06-04: qty 15, 37d supply, fill #0

## 2018-06-04 MED ORDER — BLOOD SUGAR DIAGNOSTIC STRIPS
3 refills | 0.00000 days | Status: CP
Start: 2018-06-04 — End: 2018-11-24

## 2018-06-04 MED ORDER — PREDNISONE 10 MG TABLET
ORAL_TABLET | Freq: Every day | ORAL | 0 refills | 0.00000 days | Status: CP
Start: 2018-06-04 — End: 2018-07-03
  Filled 2018-06-04: qty 12, 4d supply, fill #0

## 2018-06-04 MED ORDER — FLUOXETINE 20 MG CAPSULE
ORAL_CAPSULE | Freq: Every evening | ORAL | 3 refills | 0 days | Status: CP
Start: 2018-06-04 — End: 2018-09-03
  Filled 2018-06-04: qty 90, 30d supply, fill #0

## 2018-06-04 MED FILL — ACETAMINOPHEN 500 MG TABLET: 30 days supply | Qty: 180 | Fill #3

## 2018-06-04 MED FILL — POLYETHYLENE GLYCOL 3350 17 GRAM ORAL POWDER PACKET: 7 days supply | Qty: 14 | Fill #0 | Status: AC

## 2018-06-04 MED FILL — ACETAMINOPHEN 500 MG TABLET: 30 days supply | Qty: 180 | Fill #3 | Status: AC

## 2018-06-04 MED FILL — PREDNISONE 10 MG TABLET: 4 days supply | Qty: 12 | Fill #0 | Status: AC

## 2018-06-04 MED FILL — LEVEMIR FLEXTOUCH U-100 INSULIN 100 UNIT/ML (3 ML) SUBCUTANEOUS PEN: 37 days supply | Qty: 15 | Fill #0 | Status: AC

## 2018-06-04 MED FILL — FLUOXETINE 20 MG CAPSULE: 30 days supply | Qty: 90 | Fill #0 | Status: AC

## 2018-06-04 MED FILL — GABAPENTIN 300 MG CAPSULE: 30 days supply | Qty: 30 | Fill #0 | Status: AC

## 2018-06-04 NOTE — Unmapped (Signed)
Pt A&Ox4. VSS throughout shift. Orthostatic VS negative. Pt remains on tele. No calls from tele. Fall precautions maintained. No falls thus far this shift. No skin issues noted. Pt afebrile. No s/s infection. Insulin administered per MAR. Pt's BG has been in the 190s-200s this shift.  Pt's K+ elevated to 6 today. 1x dose Miralax & keyexalate given. Pt had a BM. K+ level drawn after & was improved to 5.3. Pt's wife at bedside. Pt & pt's spouse are eagerly awaiting discharge.    Problem: Adult Inpatient Plan of Care  Goal: Plan of Care Review  Outcome: Progressing  Flowsheets (Taken 06/03/2018 1724)  Progress: improving  Plan of Care Reviewed With:   patient   spouse  Goal: Patient-Specific Goal (Individualization)  Outcome: Progressing  Flowsheets (Taken 06/03/2018 1724)  Patient-Specific Goals (Include Timeframe): Pt's K+ level will decrease to WNL by discharge.  Individualized Care Needs: Telemetry, continuous pulse ox, AC/HS, Xeralto, ortho BP, fall precautions, electrolyte imbalance  Anxieties, Fears or Concerns: Pt states he is ready to go home  Goal: Absence of Hospital-Acquired Illness or Injury  Outcome: Progressing  Intervention: Identify and Manage Fall Risk  Flowsheets (Taken 06/03/2018 1724)  Safety Interventions:   bed alarm   environmental modification   fall reduction program maintained   family at bedside   low bed  Intervention: Prevent Skin Injury  Flowsheets (Taken 06/03/2018 1724)  Pressure Reduction Techniques: frequent weight shift encouraged  Intervention: Prevent VTE (venous thromboembolism)  Flowsheets (Taken 06/03/2018 1724)  VTE Prevention/Management:   ambulation promoted   dorsiflexion/plantar flexion performed  Intervention: Prevent Infection  Flowsheets (Taken 06/03/2018 1724)  Infection Prevention: handwashing promoted  Goal: Optimal Comfort and Wellbeing  Outcome: Progressing  Intervention: Monitor Pain and Promote Comfort  Flowsheets (Taken 06/03/2018 1724)  Pain Management Interventions:   care clustered   quiet environment facilitated   relaxation techniques promoted  Intervention: Provide Person-Centered Care  Flowsheets (Taken 06/03/2018 1724)  Trust Relationship/Rapport:   care explained   choices provided   emotional support provided   empathic listening provided   questions answered   questions encouraged  Goal: Readiness for Transition of Care  Outcome: Progressing  Intervention: Mutually Develop Transition Plan  Flowsheets (Taken 06/03/2018 1724)  Discharge Facility/Level of Care Needs: (home w/spouse & HH) other (see comments)  Equipment Needed After Discharge: none  Equipment Currently Used at Home:   cane, straight   walker, rolling   wheelchair, manual  Anticipated Changes Related to Illness: inability to care for self  Transportation Anticipated:   family or friend will provide   car  Concerns to be Addressed: no discharge needs identified  Readmission Within the Last 30 Days: no previous admission in last 30 days  Patient/Family Anticipated Services at Transition:   home health care   case manager  Patient/Family Anticipates Transition to: home  Goal: Rounds/Family Conference  Outcome: Progressing  Flowsheets (Taken 06/03/2018 1724)  Participants:   patient   physician   nursing     Problem: Infection  Goal: Infection Symptom Resolution  Outcome: Progressing  Intervention: Prevent or Manage Infection  Flowsheets (Taken 06/03/2018 1724)  Fever Reduction/Comfort Measures: medication administered     Problem: Fall Injury Risk  Goal: Absence of Fall and Fall-Related Injury  Outcome: Progressing  Intervention: Identify and Manage Contributors to Fall Injury Risk  Flowsheets (Taken 06/03/2018 1724)  Medication Review/Management: medications reviewed  Self-Care Promotion: BADL personal objects within reach  Intervention: Promote Injury-Free Environment  Flowsheets (Taken 06/03/2018 1724)  Safety  Interventions:   bed alarm   environmental modification   fall reduction program maintained   family at bedside   low bed     Problem: Arrhythmia/Dysrhythmia (Heart Failure)  Goal: Stable Heart Rate and Rhythm  Outcome: Progressing     Problem: Fluid Imbalance (Heart Failure)  Goal: Fluid Balance  Outcome: Progressing     Problem: Respiratory Compromise (Heart Failure)  Goal: Effective Oxygenation and Ventilation  Outcome: Progressing  Intervention: Promote Airway Secretion Clearance  Flowsheets (Taken 06/03/2018 1724)  Cough And Deep Breathing: done with encouragement  Intervention: Optimize Oxygenation and Ventilation  Flowsheets (Taken 06/03/2018 1724)  Airway/Ventilation Management: pulmonary hygiene promoted     Problem: Heart Failure Comorbidity  Goal: Maintenance of Heart Failure Symptom Control  Outcome: Progressing  Intervention: Maintain Heart Failure-Management Strategies  Flowsheets (Taken 06/03/2018 1724)  Medication Review/Management: medications reviewed     Problem: Hypertension Comorbidity  Goal: Blood Pressure in Desired Range  Outcome: Progressing  Intervention: Maintain Hypertension-Management Strategies  Flowsheets (Taken 06/03/2018 1724)  Syncope Management: position changed slowly  Medication Review/Management: medications reviewed     Problem: Obstructive Sleep Apnea Risk or Actual (Comorbidity Management)  Goal: Unobstructed Breathing During Sleep  Outcome: Progressing     Problem: Self-Care Deficit  Goal: Improved Ability to Complete Activities of Daily Living  Outcome: Progressing  Intervention: Promote Activity and Functional Independence  Flowsheets (Taken 06/03/2018 1724)  Activity Assistance Provided: assistance, 2 people  Adaptive Equipment Use: (bariatric shower chair requested)   Self-Care Promotion: BADL personal objects within reach

## 2018-06-04 NOTE — Unmapped (Signed)
I was the supervising physician in the delivery of the service. Demico Ploch D Shean Gerding, MD

## 2018-06-04 NOTE — Unmapped (Signed)
CCM intake call    Jeremy Oliver calls. Pt has been discharged today.  Pt will have labs drawn today, check K.    Requests CCM to f/u [id number] rx and levemir.     CCM called [facility],  They did not receive renewals on Levemir rx and Fluoxetine rx. Ordered per protocol.

## 2018-06-04 NOTE — Unmapped (Signed)
Physician Discharge Summary [facility]  [address] [facility]  [address]  [place] [address]  Dept: [phone]  Loc: [phone]     Identifying Information:   Jeremy Oliver  [birth year]  [id number]    Primary Care Physician: Kurtis Bushman, MD   Code Status: Full Code    Admit Date: 05/31/2018    Discharge Date: 06/04/2018     Discharge To: Home    Discharge Service: MDW - GEN Med [place]     Discharge Attending Physician: No att. providers found    Discharge Diagnoses:  Principal Problem:    Syncope POA: Yes  Active Problems:    CAD (coronary artery disease) (RAF-[name]) POA: Yes    Essential hypertension (RAF-[name]) POA: Yes    Obesity, Class III, BMI 40-49.9 (morbid obesity) (CMS-[name]) POA: Yes    Mild intermittent asthma without complication POA: Yes    Paroxysmal atrial fibrillation (CMS-[name]) POA: Yes    OSA (obstructive sleep apnea) POA: Yes    Tobacco use disorder POA: Yes    Type 2 diabetes mellitus with stage 4 chronic kidney disease, with long-term current use of insulin (CMS-[name]) POA: Not Applicable    Cardiac pacemaker in situ POA: Yes    Chronic heart failure with reduced ejection fraction and diastolic dysfunction (CMS-[name]) POA: Yes  Resolved Problems:    * No resolved hospital problems. *      Outpatient Provider Follow Up Issues:   1) Hydralazine discontinued given episodes of hypotension   2) Patient will need repeat BMP on 3/6 to monitor potassium  3) Patient will need to see PCP the week of 3/9 to ensure potassium is stable   4) Patient will need to complete 5 day course of prednisone for gout flare (start date: 3/5)  5) Gabapentin decreased to 300mg  from 600mg   6) Losartan held at discharge given hyperkalemia    Hospital Course:   48 Male w/ PMHX of CAD, HFrEF, CKDIV, asthma, severe obesity, OSA, paroxysmal Afib s/p defibrillator, atrial pacemaker who had NSTEMI resulting in PCI 05/2018 followed by syncope admission 2/22 - 2/23 in Observation for syncope.  During that admission, patient was evaluated by cardiology for syncope and the net result was that this was most likely polypharmacy and possibly orthostatic hypotension.  The net result was that his amlodipine was half to 5 mg and the patient was instructed to keep a blood pressure log and follow-up with cardiologist.     He represented on 05/31/2018 due to syncope which was felt to be polypharmacy and deconditioning.  His pacer/ICD was interrogated and found to be functioning properly; he was rate controlled and ACS was excluded with serial EKGs and troponins.  The night of admission, he had a witnessed syncopal event shortly after complaining of severe headache but had a nonfocal neurologic exam.  Patient had other episodes of syncope while admitted.  He was found to be hypotensive during one of these episodes.  Syncope was thought to be possibly due to hydralazine causing hypotension, so hydralazine was discontinued on discharge.      It is also very likely that the patient psychosocial situation and severe medical comorbidities are causing an adjustment reaction.  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   For future nausea/vomiting, acute anxiety, or headaches, patient was encouraged to take 25 mg of Seroquel to try to make the symptoms abate.    Patient remained  in observation the following day and was somnolent but arousable for nearly 24 hours.  His blood pressures were soft without frank hypotension but later improved.  By the morning of 3/4, the patient was ambulating independently with a walker, eating a regular diet and was alert and oriented and ready to leave the hospital.  However, a BMP revealed that his potassium was 6.3 compared to 5.0 the day before.  This was thought to be 2/2 to holding diuretics.  He was given IV Lasix and albuterol and the potassium improved to 5.6; however, it increased to 6.0 on 3/5.  He was given his home bumex and potassium decreased to 5.3.  Repeat potassium on the night of 3/5 was 5.3 and patient was discharged with close follow-up.     He will need to follow-up for labs on 3/6.  He will also need to follow-up with his PCP the week of 3/9 to ensure that potassium has remained stable.    Procedures:  None  No admission procedures for hospital encounter.  ______________________________________________________________________  Discharge Medications:     Your Medication List      STOP taking these medications    hydrALAZINE 25 MG tablet  Commonly known as:  APRESOLINE     losartan 50 MG tablet  Commonly known as:  COZAAR        START taking these medications    polyethylene glycol 17 gram packet  Commonly known as:  MIRALAX  Take 17 g by mouth Two (2) times a day for 7 days.     predniSONE 10 MG tablet  Commonly known as:  DELTASONE  Take 3 tablets (30 mg total) by mouth daily for 4 days.        CHANGE how you take these medications    gabapentin 300 MG capsule  Commonly known as:  NEURONTIN  Take 1 capsule (300 mg total) by mouth nightly.  What changed:  how much to take        CONTINUE taking these medications    acetaminophen 500 MG tablet  Commonly known as:  TYLENOL  TAKE 2 TABLETS (1 000MG ) BY MOUTH EVERY 8 HOURS AS NEEDED FOR PAIN     allopurinoL 100 MG tablet  Commonly known as:  ZYLOPRIM  Take 0.5 tablets (50 mg total) by mouth every other day.     amiodarone 200 MG tablet  Commonly known as:  PACERONE  TAKE 1 TABLET BY MOUTH ONCE DAILY     atorvastatin 80 MG tablet  Commonly known as:  LIPITOR  Take 1 tablet (80 mg total) by mouth daily.     blood sugar diagnostic Strp  USE TO CHECK BLOOD SUGAR 4 TIMES DAILY (BEFORE MEALS AND NIGHTLY)     blood-glucose meter kit  Use to check blood sugars, #1 meter, #100 strips. Freestyle glucose meter. ICD-9 250.00     BRILINTA 90 mg Tab  Generic drug:  ticagrelor  TAKE 1 TABLET BY MOUTH TWICE DAILY     bumetanide 2 MG tablet  Commonly known as:  BUMEX  TAKE 1 TABLET BY MOUTH ONCE DAILY carvediloL 25 MG tablet  Commonly known as:  COREG  TAKE 1 TABLET BY MOUTH WITH THE MORNING MEAL AND TAKE 2 TABLETS WITH THE EVENING MEAL     cholecalciferol (vitamin D3) 5,000 unit capsule  Take 1 capsule (5,000 Units total) by mouth daily.     diclofenac sodium 1 % gel  Commonly known as:  VOLTAREN  Apply 2 g  topically Three (3) times a day as needed for pain.     empty container Misc  Use as directed to dispose of injectable medications     ezetimibe 10 mg tablet  Commonly known as:  ZETIA  TAKE 1 TABLET BY MOUTH ONCE DAILY     FLUoxetine 20 MG capsule  Commonly known as:  PROzac  Take 60 mg by mouth nightly.     FREESTYLE NAVIGATOR GLUC SENS Devi  Generic drug:  blood-glucose sensor  Frequency:BID   Dosage:0.0     Instructions:  Note:Dose: 1     insulin detemir U-100 100 unit/mL (3 mL) injection pen  Commonly known as:  LEVEMIR  Inject 40 Units under the skin nightly.     insulin syringe-needle U-100 0.3 mL 31 gauge x 5/16 Syrg  Use to inject insulin twice daily with meals     isosorbide mononitrate 30 MG 24 hr tablet  Commonly known as:  IMDUR  Take 1 tablet (30 mg total) by mouth daily.     melatonin 3 mg Cap  Take 9 mg by mouth nightly.     nitroglycerin 0.4 MG SL tablet  Commonly known as:  NITROSTAT  Place 1 tablet (0.4 mg total) under the tongue every five (5) minutes as needed for chest pain.     NovoLOG Flexpen U-100 Insulin 100 unit/mL (3 mL) injection pen  Generic drug:  insulin ASPART  Inject 0.15 mL (15 Units total) under the skin Three (3) times a day with a meal.     omeprazole 20 MG capsule  Commonly known as:  PriLOSEC  Take 1 capsule (20 mg total) by mouth daily.     ON CALL LANCET 30 gauge Misc  Generic drug:  lancets  USE TO CHECK BLOOD SUGAR THREE TIMES DAILY WITH MEALS     OZEMPIC 0.25 mg or 0.5 mg(2 mg/1.5 mL) Pnij  Generic drug:  semaglutide  Inject 0.25 mg under the skin every seven (7) days. For 4 weeks then increase to 0.5mg  every 7 days     PRALUENT PEN 75 mg/mL Pnij  Generic drug: alirocumab  INJECT THE CONTENTS OF 1 PEN (75 MG) UNDER THE SKIN EVERY 14 DAYS     QUEtiapine 50 MG tablet  Commonly known as:  SEROquel  Take 1 tablet (50 mg total) by mouth two (2) times a day as needed (headache).     RENVELA 800 mg tablet  Generic drug:  sevelamer  TAKE 2 TABLETS (1600MG ) BY MOUTH THREE TIMES DAILY WITH MEALS     rivaroxaban 15 mg Tab  Commonly known as:  XARELTO  Take 1 tablet (15 mg total) by mouth daily with evening meal.     traMADol 50 mg tablet  Commonly known as:  ULTRAM  Take 1 tablet (50 mg total) by mouth every six (6) hours as needed.     UNIFINE PENTIPS 31 gauge x 5/16 Ndle  Generic drug:  pen needle, diabetic  USE WITH INSULIN AND VICTOZA 5 TIMES DAILY     VENTOLIN HFA 90 mcg/actuation inhaler  Generic drug:  albuterol  Inhale 2 puffs into the lungs every 6 (six) hours as needed for wheezing or shortness of breath.            Allergies:  Morphine  ______________________________________________________________________  Pending Test Results (if blank, then none):      Most Recent Labs:  All lab results last 24 hours -   Recent Results (from the past 24 hour(s))  POCT Glucose    Collection Time: 06/03/18  8:33 AM   Result Value Ref Range    Glucose, POC 197 (H) 70 - 179 mg/dL   Basic Metabolic Panel    Collection Time: 06/03/18  9:10 AM   Result Value Ref Range    Sodium 137 135 - 145 mmol/L    Potassium 6.0 (H) 3.5 - 5.0 mmol/L    Chloride 105 98 - 107 mmol/L    CO2 21.0 (L) 22.0 - 30.0 mmol/L    Anion Gap 11 7 - 15 mmol/L    BUN 38 (H) 7 - 21 mg/dL    Creatinine 1.61 (H) 0.70 - 1.30 mg/dL    BUN/Creatinine Ratio 10     EGFR CKD-EPI Non-African American, Male 17 (L) >=60 mL/min/1.51m2    EGFR CKD-EPI African American, Male 20 (L) >=60 mL/min/1.28m2    Glucose 223 (H) 70 - 179 mg/dL    Calcium 8.9 8.5 - 09.6 mg/dL   POCT Glucose    Collection Time: 06/03/18 12:57 PM   Result Value Ref Range    Glucose, POC 275 (H) 70 - 179 mg/dL   Potassium Level    Collection Time: 06/03/18  3:17 PM Result Value Ref Range    Potassium 5.3 (H) 3.5 - 5.0 mmol/L   POCT Glucose    Collection Time: 06/03/18  6:27 PM   Result Value Ref Range    Glucose, POC 354 (H) 70 - 179 mg/dL   Potassium Level    Collection Time: 06/03/18  8:41 PM   Result Value Ref Range    Potassium 5.3 (H) 3.5 - 5.0 mmol/L   POCT Glucose    Collection Time: 06/03/18 10:42 PM   Result Value Ref Range    Glucose, POC 346 (H) 70 - 179 mg/dL       Relevant Studies/Radiology (if blank, then none):  Xr Chest 1 View Portable    Result Date: 05/31/2018  EXAM: XR CHEST PORTABLE DATE: 05/30/2018 ACCESSION: [id number] UN DICTATED: 05/31/2018 2:53 PM INTERPRETATION LOCATION: [facility] CLINICAL INDICATION: 48 years old Male with CHEST PAIN  COMPARISON: Chest radiographs on 05/22/2018. TECHNIQUE: Portable Chest Radiograph. FINDINGS: Unchanged left-sided AICD with leads terminating in the right atrium and right ventricle. Lungs radiographically clear. No pleural effusion or pneumothorax. Stable cardiomediastinal silhouette. Remote left rib fractures     No acute airspace disease.    Ct Head Wo Contrast    Result Date: 06/01/2018  EXAM: Computed tomography, head or brain without contrast material. DATE: 06/01/2018 2:09 AM ACCESSION: [id number] UN DICTATED: 06/01/2018 2:11 AM INTERPRETATION LOCATION: [facility] CLINICAL INDICATION: 48 years old Male with headache  COMPARISON: CT head dated 05/14/2018 TECHNIQUE: Axial CT images of the head  from skull base to vertex without contrast. FINDINGS: There is no midline shift or mass lesion.  There is no evidence of intracranial hemorrhage or acute infarct. Gray-white matter differentiation is maintained. The basal cisterns are patent. No fractures are evident.  The paranasal sinuses and mastoid air cells are pneumatized.     No acute intracranial abnormality.    Echocardiogram Follow Up/limited Echo With Contrast    Result Date: 06/02/2018  ?? Limited study to evaluate ventricular function -- Ultrasound enhancing agent utilized to improve endocardial border definition. ?? Mildly decreased global left ventricular systolic function (EF 40-45%), with segmental wall motion abnormalities in the distribution of the anterior descending artery, unchanged from the previous study of 05/13/2018. ?? Normal right ventricular systolic function. ?? No gross structural valvular abnormalities.  Ep Rn Only - Icd / Pacemaker Evaluation    Result Date: 05/31/2018  Skipper Cliche, RN     05/31/2018  3:20 PM ICD Interrogation Manufacturer: [facility]  Battery: 12.5 years estimated longevity Mode: DDD, Lower rate limit 70 bpm Tachy Settings: Presenting Rhythm: NSR Underlying rhythm: NSR Impedence (ohms) Atrial lead 543 Right Ventricular lead 304  Sensing (mV) (P)Atrial lead 6.9 (R)Ventricular lead 11.3 Capture threshold (V @ ms) Atrial lead 0.4 V @ 0.4 ms Right Ventricular lead 0.7 V @ 0.4 ms  Diagnostics/Episodes ?? none  Reprogramming ?? none See PDF in media tab for full interrogation record Questions, please call the Ep Device Rn via Vocera or dial ext [phone] Ep Device clinic [phone]    Ed Pocus Rush Protocol Emergency    Result Date: 06/02/2018  Limited Cardiac Ultrasound (CPT [phone]) Indication: A focused ultrasound exam of the heart was performed to evaluate for pericardial effusion, tamponade, severe hypovolemia, or gross abnormalities of cardiac anatomy or function in this patient. The ultrasound was performed with the following indications, as noted in the H&P: Chest pain Identified structures: The pericardial sac, myocardium, and 4 chambers were identified using the following views: parasternal long axis and IVC (long axis) Findings: Exam of the above structures revealed the following findings:  Pericardial effusion: Absent   Pericardial tamponade: N/A  Global LV function: Normal  Right ventricular size: Normal   Signs of RV strain: N/A  IVC: Normal  Other findings: No obvious IVC collapse on exam, difficult to capture a very clear image. Limitations: Large body habitus, difficult views. Impression: No sonographic evidence of significant cardiac dysfunction, No sonographic evidence of significant pericardial effusion, Normal RV and No sonographic evidence of volume depletion Interpreted by: Alfredia Ferguson, MD Quality Assurance  After review of the point-of-care ultrasound performed in this case I assess the overall image quality as: Image quality: Minimal criteria met for diagnosis, recognizable structures but with some technical or other flaws  The accuracy of interpretation of images as presented reflects a true negative. This study does  meet minimum criteria for credentialing and billing. Robbie Lis, MD     ______________________________________________________________________  Discharge Instructions:   Activity Instructions     Activity as tolerated            Diet Instructions     Discharge diet (specify)      Discharge Nutrition Therapy:  Consistent Carbohydrate          Other Instructions     Call MD for:  extreme fatigue      Call MD for:  persistent dizziness or light-headedness      Call MD for:  persistent nausea or vomiting      Call MD for:  severe uncontrolled pain      Call MD for: Temperature > 38.5 Celsius ( > 101.3 Fahrenheit)      Discharge instructions      You came to the hospital because you were having syncopal episodes.  We will stop your hydralazine on discharge to prevent your blood pressure from dropping. You should also stop taking your losartan until you see your primary care provider, as this medication can cause your potassium to remain elevated.  It is very important that you get blood work drawn on 3/6.  You will also need to follow-up with Dr. Hulan Fess on either 3/9 or 3/10.  We hope that you are feeling better.  Follow Up instructions and Outpatient Referrals     Order to schedule follow-up visit with primary care physician      Do you want ongoing co-management?:  No    Basic metabolic panel      Is this a fasting order?:  No     BMP contains the following laboratory tests: NA, K, CL, CO2, BUN, CR, GLUC, and CA.     Call MD for:  extreme fatigue      Call MD for:  persistent dizziness or light-headedness      Call MD for:  persistent nausea or vomiting      Call MD for:  severe uncontrolled pain      Call MD for: Temperature > 38.5 Celsius ( > 101.3 Fahrenheit)      Discharge instructions      Basic metabolic panel      Is this a fasting order?:  No     BMP contains the following laboratory tests: NA, K, CL, CO2, BUN, CR, GLUC, and CA.           Appointments which have been scheduled for you    Jun 07, 2018  3:00 PM EDT  (Arrive by 2:45 PM)  PHARMACOTHERAPY with Lyda Kalata Max, CPP  [facility] HEART VASCULAR CTR [address] [facility] REGION) [address]  [address]  [place] [address]  [phone]      Jun 22, 2018 10:00 AM EDT  RETURN  RETINA with Melvyn Novas, MD  [facility] OPHTHALMOLOGY [address] [facility] REGION) [address]  [address]  [place] [address]  [phone]      Jun 29, 2018 11:45 AM EDT  (Arrive by 11:30 AM)  RETURN ANTICOAG with Leda Min, PA  [facility] INTERNAL MEDICINE [place] [facility] REGION) [address] [place] [address]  [phone]      Jun 29, 2018  1:00 PM EDT  (Arrive by 12:45 PM)  SPIROMETRY with [facility] INTERNAL MEDICINE [place] [facility] REGION) [address] [place] [address]  [phone]      Jun 29, 2018  2:00 PM EDT  (Arrive by 1:45 PM)  RETURN  ATTENDING with Roma Schanz, MD  [facility] INTERNAL MEDICINE [place] [facility] REGION) [address] [place] [address]  [phone]      Jun 29, 2018  3:00 PM EDT  (Arrive by 2:45 PM)  RETURN COUNSELING with Ebbie Latus, LCSW  [facility] INTERNAL MEDICINE [place] [facility] REGION) [address] [place] [address]  [phone]      Jul 20, 2018  9:30 AM EDT  (Arrive by 9:15 AM)  RETURN  GENERAL with Mila Merry, MD  [facility] [place] [facility]) [address]  [place] [address]  [phone]      Jul 22, 2018  3:50 PM EDT  (Arrive by 3:35 PM)  Danelle Earthly RETURN with Marlaine Hind, MD  [facility] HEART VASCULAR CTR [address] [facility] REGION) [address]  [address]  [place] [address]  [phone]      Jul 27, 2018 12:30 AM EDT  (Arrive by 12:00 AM)  REMOTE ICD FOLLOW-UP with [facility] EP REMOTE MONITORING  [place] EP REMOTE MONITORING [place] [facility]  [facility] REGION) [address]  [address] [place] [address]  [phone]   This is a remote appointment, you do not need to come into the office.          ______________________________________________________________________  Discharge Day Services:  BP 150/89  - Pulse 76  - Temp 36.3 ??C (Oral)  - Resp 20  - Ht 188 cm (6' 2)  - Wt (!) 153.2 kg (337 lb 11.9 oz)  - SpO2 97%  - BMI 43.36 kg/m??   Pt seen on the day of discharge and determined appropriate for discharge.    Condition at Discharge: stable    Length of Discharge: I spent greater than 30 mins in the discharge of this patient.

## 2018-06-05 NOTE — Unmapped (Addendum)
CARE MANAGEMENT ENCOUNTER    #Housing  SWI placed call to pt to f/u about housing situation discussed in hospital f/u visit last week. Pt's wife [name] answered and said pt was sleeping. SWI offered to call back another time.    #Lab Results  Pt's wife became agitated when SWI stated that she was not authorized to share lab results from pt's BMP today with the pt or his wife. Pt's wife stated, Just tell me if the potassium number is over 6. If it is I need to take him in now.  SWI said a provider would be calling soon and sent an urgent inbasket message to pt's PCP Dr. Hulan Fess requesting that contact be made with the pt.    5:23pm - SWI learned that Dr. Hulan Fess was not in clinic this afternoon; spoke to Dr. Rose Fillers who agreed to call pt tonight. SWI called pt and spoke to wife again. SWI shared that they would not be getting a call back right away as promised because Dr. Hulan Fess was not available but that another provider would be calling tonight. Pt's wife very upset. SWI validated frustration and apologized for delay but pt's wife ultimately hung up the phone.     Time Spent: 30 minutes    Charlyne Quale  MSW Intern for Ferol Luz, MSW, [facility]  [phone]

## 2018-06-07 DIAGNOSIS — I5042 Chronic combined systolic (congestive) and diastolic (congestive) heart failure: Principal | ICD-10-CM

## 2018-06-07 DIAGNOSIS — I2510 Atherosclerotic heart disease of native coronary artery without angina pectoris: Principal | ICD-10-CM

## 2018-06-07 NOTE — Unmapped (Signed)
I was the supervising physician in the delivery of the service. Noe Pittsley D Sarahmarie Leavey, MD

## 2018-06-07 NOTE — Unmapped (Signed)
Patient was contacted by Graduate Social Work Intern Charlyne Quale under supervision with me and in consultation as indicated.     Ferol Luz,  MSW, LCSW  Clinical Supervisor  [employer]

## 2018-06-07 NOTE — Unmapped (Deleted)
[facility]  [address]  [place], [place] [address]  PHONE: [phone]  FAX: [phone]    CLINICAL PHARMACIST PRACTITIONER NOTE   PHARMACOTHERAPY- Lipid    Referring Provider: Winn Jock, MD  Cardiologist: Hoy Finlay, MD  Primary Care Provider: Kurtis Bushman, MD    Assessment and Plan:     #Coronary artery disease involving native coronary artery of native heart without angina pectoris:  Patient with significant CAD, s/p multiple stents.  Most recent LDL done on 12/12 and unable to calculate due to high TG levels.  Last LDL on record of 171 in 2017.  Most recent TC - 279 and non -HDL 230.  Patient is very high risk ASCVD (recent ACS) and on Agnes Probert lipid lowering regimen with atorvastatin 80 mg and ezetimibe 10 mg daily and therefore adding a PCSK9 is indicated per ACC/AHA 2018 Guidelines on Management of high cholesterol.    ?? Educated patient on Repatha indication, MOA, side effect and administration  ?? Repatha 140 mg Rx to be sent to [facility] for benefit investigation  ?? LDL to be checked today to provide to insurance    #Medication Adherence/Monitoring:   ?? Patient has Medicare (disability) will work on benefit investigation   ?? Follow up labs to be done after 3rd dose of PCSK9    FOLLOW UP: Dr. Andrey Farmer    The visit was approximately 15 minutes in duration, and greater than 50% of the time was spent in direct face-to-face counseling regarding pharmacotherapy.    Assess  Subjective:   Jeremy Oliver is a 48 y.o. year old male with a history of CAD, ICM, STEMI in 2018 with VT arrest with emergent PCI to mid LAD, Ischemic cardiomyopathy with ICD [facility]), CKD, paroxysmal afib, HTN, HLD, T2DM, and morbid obesity recently was admitted at [facility].  Left heart cath done at OSH that revealed??in-stent restenosis of prior LAD stents (50-60%), 80% ostial D1, 70% proximal Lcx (similar to prior cath), 90% distal RCA bifurcation lesion (new from prior cath) and he was??transferred to [facility] for management. Underwent PCI 12/13 with severe mid and distal RCA stenosis s/p drug-eluding stent (DES) with good blood flow (TIMI 3) post stent-placement and uncomplicated recovery/hospitalization post cath, discharged home on 12/15.    Patient medical management for CAD include atorvastatin 80 mg, ezetimibe 25 mg, Brillinta 90 mg, carvediolol 25 mg,  IMDUR 30 mg daily and hydralazine 25 mg TID increased to 50 mg TID.  Patient on insulin and is familiar with injections.  He is on disability and has Medicare plan.      Lipids- ?  Praluent 75 mcg started 2/8, second injection to be done on 2/22.    Allergies/Adverse Events:  Losartan and Morphine    Medications:  Reviewed and updated. Medication list includes revisions made during today's encounter.    Current Outpatient Medications   Medication Sig Dispense Refill   ??? acetaminophen (TYLENOL) 500 MG tablet TAKE 2 TABLETS (1 000MG ) BY MOUTH EVERY 8 HOURS AS NEEDED FOR PAIN 180 each 3   ??? albuterol HFA 90 mcg/actuation inhaler Inhale 2 puffs into the lungs every 6 (six) hours as needed for wheezing or shortness of breath. 18 g 2   ??? alirocumab (PRALUENT PEN) 75 mg/mL PnIj INJECT THE CONTENTS OF 1 PEN (75 MG) UNDER THE SKIN EVERY 14 DAYS 2 mL 11   ??? allopurinol (ZYLOPRIM) 100 MG tablet Take 0.5 tablets (50 mg total) by mouth  every other day. 30 tablet 11   ??? amiodarone (PACERONE) 200 MG tablet TAKE 1 TABLET BY MOUTH ONCE DAILY 90 tablet 3   ??? atorvastatin (LIPITOR) 80 MG tablet Take 1 tablet (80 mg total) by mouth daily. 90 tablet 3   ??? blood sugar diagnostic Strp USE TO CHECK BLOOD SUGAR 4 TIMES DAILY (BEFORE MEALS AND NIGHTLY) 100 each 3   ??? blood-glucose meter kit Use to check blood sugars, #1 meter, #100 strips. Freestyle glucose meter. ICD-9 250.00 1 each 0   ??? blood-glucose sensor (FREESTYLE NAVIGATOR GLUC SENS) Devi Frequency:BID   Dosage:0.0     Instructions:  Note:Dose: 1     ??? bumetanide (BUMEX) 2 MG tablet TAKE 1 TABLET BY MOUTH ONCE DAILY 90 tablet 3   ??? carvedilol (COREG) 25 MG tablet TAKE 1 TABLET BY MOUTH WITH THE MORNING MEAL AND TAKE 2 TABLETS WITH THE EVENING MEAL 270 tablet 3   ??? cholecalciferol, vitamin D3, 5,000 unit capsule Take 1 capsule (5,000 Units total) by mouth daily. 100 capsule 0   ??? diclofenac sodium (VOLTAREN) 1 % gel Apply 2 g topically Three (3) times a day as needed for pain.     ??? empty container Misc Use as directed to dispose of injectable medications 1 each 3   ??? ezetimibe (ZETIA) 10 mg tablet TAKE 1 TABLET BY MOUTH ONCE DAILY 90 each 3   ??? FLUoxetine (PROZAC) 20 MG capsule Take 3 capsules (60 mg total) by mouth nightly. 270 capsule 3   ??? gabapentin (NEURONTIN) 300 MG capsule Take 1 capsule (300 mg total) by mouth nightly. 30 capsule 0   ??? insulin ASPART (NOVOLOG FLEXPEN U-100 INSULIN) 100 unit/mL (3 mL) injection pen Inject 0.15 mL (15 Units total) under the skin Three (3) times a day with a meal. 15 mL 11   ??? insulin detemir U-100 (LEVEMIR) 100 unit/mL (3 mL) injection pen Inject 0.4 mL (40 Units total) under the skin nightly. 15 mL 0   ??? insulin syringe-needle U-100 0.3 mL 31 gauge x 5/16 Syrg Use to inject insulin twice daily with meals 60 each 0   ??? isosorbide mononitrate (IMDUR) 30 MG 24 hr tablet Take 1 tablet (30 mg total) by mouth daily. 90 tablet 3   ??? lancets 30 gauge Misc USE TO CHECK BLOOD SUGAR THREE TIMES DAILY WITH MEALS 250 each 2   ??? melatonin 3 mg cap Take 9 mg by mouth nightly.     ??? nitroglycerin (NITROSTAT) 0.4 MG SL tablet Place 1 tablet (0.4 mg total) under the tongue every five (5) minutes as needed for chest pain. 100 each 3   ??? omeprazole (PRILOSEC) 20 MG capsule Take 1 capsule (20 mg total) by mouth daily. 90 capsule 3   ??? polyethylene glycol (MIRALAX) 17 gram packet Mix 1 packet in 4 to 8 oz of water, juice, soda, coffee or tea and drink Two (2) times a day for 7 days. 14 packet 0   ??? predniSONE (DELTASONE) 10 MG tablet Take 3 tablets (30 mg total) by mouth daily for 4 days. 12 tablet 0 ??? QUEtiapine (SEROQUEL) 50 MG tablet Take 1 tablet (50 mg total) by mouth two (2) times a day as needed (headache). 180 tablet 3   ??? rivaroxaban (XARELTO) 15 mg Tab Take 1 tablet (15 mg total) by mouth daily with evening meal. 90 tablet 3   ??? semaglutide 0.25 mg or 0.5 mg(2 mg/1.5 mL) PnIj Inject 0.25 mg under the skin every seven (  7) days. For 4 weeks then increase to 0.5mg  every 7 days 7.5 mL 0   ??? sevelamer (RENVELA) 800 mg tablet TAKE 2 TABLETS (1600MG ) BY MOUTH THREE TIMES DAILY WITH MEALS 540 tablet 3   ??? ticagrelor (BRILINTA) 90 mg Tab TAKE 1 TABLET BY MOUTH TWICE DAILY 180 tablet 3   ??? traMADol (ULTRAM) 50 mg tablet Take 1 tablet (50 mg total) by mouth every six (6) hours as needed. 30 tablet 0   ??? UNIFINE PENTIPS 31 gauge x 5/16 Ndle USE WITH INSULIN AND VICTOZA 5 TIMES DAILY 400 each 3     No current facility-administered medications for this visit.        Objective:     There were no vitals filed for this visit.    Review of Systems:  Constitutional:  No fever, chills, or unintentional weight loss  Cardiovascular:  No chest pain or pressure, palpitations, shortness of breath, dyspnea on exertion, orthopnea, PND, or LE edema  Pulmonary:  No cough or shortness of breath  GI:  No nausea, vomiting, constipation, diarrhea, abdominal pain, abdominal distention, dyspepsia, early satiety, change in bowel habits  Endocrine:  No polyuria, polyphagia, polydipsia, or blurred vision  Psych:  No depression, anxiety, insomnia       Lab Results   Component Value Date    PRO-BNP 1,680.0 (H) 05/31/2018    PRO-BNP 1,670.0 (H) 05/11/2018    PRO-BNP 551.0 (H) 11/22/2017    PRO-BNP 231 (H) 06/08/2014    Creatinine 3.51 (H) 06/04/2018    Creatinine 3.83 (H) 06/03/2018    Creatinine 1.30 06/09/2014    Creatinine 1.56 (H) 06/08/2014    BUN 46 (H) 06/04/2018    BUN 38 (H) 06/03/2018    BUN 24 (H) 06/09/2014    BUN 28 (H) 06/08/2014    Potassium 4.8 06/04/2018    Potassium 5.3 (H) 06/03/2018    Potassium, Bld 5.5 (H) 03/25/2017 Potassium, Bld 4.6 03/25/2017    Magnesium 1.8 06/02/2018    Magnesium 1.7 05/23/2018    Magnesium 2.0 06/09/2014    Magnesium 1.7 05/12/2011    AST 12 (L) 06/01/2018    AST 15 (L) 05/31/2018    AST 37 06/08/2014    AST 24 07/21/2012    ALT 10 06/01/2018    ALT 13 05/31/2018    ALT 36 06/08/2014    ALT 30 07/21/2012    TSH 4.262 (H) 06/01/2018    TSH 2.29 06/08/2014    Total Bilirubin 0.2 06/01/2018    Total Bilirubin 0.2 05/31/2018    Total Bilirubin 0.8 06/08/2014    Total Bilirubin 0.6 07/21/2012    INR, POC 1.2 02/12/2018    INR, POC 0.9 02/01/2018    INR 0.99 05/31/2018    INR 1.03 05/11/2018    WBC 9.2 06/01/2018    WBC 7.5 06/09/2014    HGB 9.4 (L) 06/01/2018    Hemoglobin 8.1 (L) 03/25/2017    HCT 30.3 (L) 06/01/2018    HCT 37.9 (L) 06/09/2014    Platelet 250 06/01/2018    Platelet 190 06/09/2014    Triglycerides 654 (H) 05/13/2018    Triglycerides 400 (H) 08/30/2012    HDL 27 (L) 05/11/2018    HDL 35 (L) 08/30/2012    Non-HDL Cholesterol 202 05/11/2018    LDL Calculated  05/11/2018      Comment:      Unable to calculate due to triglyceride greater than 400 mg/dL.    LDL Direct 239 08/30/2012  LDL Cholesterol, Calculated 264 08/30/2012

## 2018-06-07 NOTE — Unmapped (Signed)
CCM Follow-up Call      PCP Action:None     CCM Action:None    Patient Action:call CCM re: updates, and/or [facility]      Pt's wife calling in primarily to tell CCM pt is home from hospital.  Hospital f/u scheduled.  Pt states has not had CP since last being in the [facility]. Pt in good spirits.  Hosp f/u appt scheduled.  Pt will bring glucometer and all meds.  Today's fasting BG 150. (states that's about what it usually is)

## 2018-06-09 ENCOUNTER — Other Ambulatory Visit: Payer: Self-pay

## 2018-06-09 ENCOUNTER — Emergency Department
Admission: EM | Admit: 2018-06-09 | Discharge: 2018-06-09 | Disposition: A | Discharge status: Home / Self Care | Payer: Medicare Other | Attending: Emergency Medicine | Admitting: Emergency Medicine

## 2018-06-09 ENCOUNTER — Emergency Department: Payer: Medicare Other

## 2018-06-09 ENCOUNTER — Encounter: Payer: Self-pay | Admitting: Emergency Medicine

## 2018-06-09 DIAGNOSIS — I2510 Atherosclerotic heart disease of native coronary artery without angina pectoris: Principal | ICD-10-CM

## 2018-06-09 DIAGNOSIS — Z87891 Personal history of nicotine dependence: Secondary | ICD-10-CM | POA: Diagnosis not present

## 2018-06-09 DIAGNOSIS — I5022 Chronic systolic (congestive) heart failure: Secondary | ICD-10-CM | POA: Insufficient documentation

## 2018-06-09 DIAGNOSIS — I808 Phlebitis and thrombophlebitis of other sites: Secondary | ICD-10-CM | POA: Diagnosis not present

## 2018-06-09 DIAGNOSIS — E119 Type 2 diabetes mellitus without complications: Secondary | ICD-10-CM | POA: Diagnosis not present

## 2018-06-09 DIAGNOSIS — I809 Phlebitis and thrombophlebitis of unspecified site: Secondary | ICD-10-CM

## 2018-06-09 DIAGNOSIS — M25531 Pain in right wrist: Secondary | ICD-10-CM | POA: Diagnosis present

## 2018-06-09 DIAGNOSIS — R52 Pain, unspecified: Secondary | ICD-10-CM

## 2018-06-09 DIAGNOSIS — I110 Hypertensive heart disease with heart failure: Secondary | ICD-10-CM | POA: Insufficient documentation

## 2018-06-09 MED ORDER — OXYCODONE-ACETAMINOPHEN 5 MG-325 MG TABLET
ORAL_TABLET | Freq: Three times a day (TID) | ORAL | 0 refills | 0 days | PRN
Start: 2018-06-09 — End: 2018-09-30

## 2018-06-09 MED ORDER — OXYCODONE-ACETAMINOPHEN 5-325 MG PO TABS: 1.0000 | ORAL_TABLET | Freq: Three times a day (TID) | ORAL | 0 refills | Status: DC | PRN

## 2018-06-09 MED ORDER — OXYCODONE-ACETAMINOPHEN 5-325 MG PO TABS
2.0000 | ORAL_TABLET | Freq: Once | ORAL | Status: AC
  Administered 2018-06-09: 2 via ORAL
  Filled 2018-06-09: qty 2

## 2018-06-09 NOTE — ED Notes (Signed)
Verbal order obtained for u/s by Dr Alfred Levins, no further protocols at this time to be done

## 2018-06-09 NOTE — ED Triage Notes (Addendum)
Pt to triage via w/c with no distress noted; reports was hospitalized at [facility]; had a SL inserted by EMS that was removed on Friday am; upon removal felt a pain and "it didn't look right, like part was missing"; c/o persistent pain & swelling to right wrist; no redness noted

## 2018-06-09 NOTE — ED Provider Notes (Signed)
[facility] Emergency Department Provider Note       Time seen: ----------------------------------------- 8:53 PM on 06/09/2018 -----------------------------------------   I have reviewed the triage vital signs and the nursing notes.  HISTORY   Chief Complaint Wrist Pain    HPI Edwin [name] is a 48 y.o. male with a history of coronary artery disease, diabetes, hyperlipidemia, hypertension, paroxysmal atrial fibrillation who presents to the ED for right wrist pain.  Patient states he was recently hospitalized at [facility], had a peripheral IV placed in his right wrist by EMS that was removed.  Upon removal he states he did not look right and has been having right wrist pain.  No redness was noted.  Past Medical History:  Diagnosis Date  . CAD (coronary artery disease)   . Chronic systolic heart failure ([place])   . Diabetes mellitus without complication ([place])   . Heart attack ([place])   . Hyperlipidemia   . Hypertension   . Ischemic cardiomyopathy   . Paroxysmal atrial fibrillation [facility])     Patient Active Problem List   Diagnosis Date Noted  . NSTEMI (non-ST elevated myocardial infarction) ([place]) 03/08/2018  . 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 [employer] pacemaker    . CARDIAC SURGERY    . IRRIGATION AND DEBRIDEMENT KNEE Left 12/17/2017   Procedure: LEFT KNEE DEBRIDEMENT AND SYNOVECTOMY;  Surgeon: Thornton Park, MD;  Location: [facility] ORS;  Service: Orthopedics;  Laterality: Left;  . JOINT REPLACEMENT    . LEFT HEART CATH AND CORONARY ANGIOGRAPHY Left 03/09/2018   Procedure: LEFT HEART CATH AND CORONARY ANGIOGRAPHY;  Surgeon: Nelva Bush, MD;  Location: [place] CV LAB;  Service: Cardiovascular;  Laterality: Left;  . PERCUTANEOUS CORONARY STENT INTERVENTION (PCI-S)      Allergies Morphine and related  Social History Social History   Tobacco Use  . Smoking status: Former  Research scientist (life sciences)  . Smokeless tobacco: Current User    Types: Snuff  Substance Use Topics  . Alcohol use: Never    Frequency: Never  . Drug use: Never   Review of Systems Constitutional: Negative for fever. Musculoskeletal: Positive for right wrist pain Skin: Negative for rash. Neurological: Negative for headaches, focal weakness or numbness.  All systems negative/normal/unremarkable except as stated in the HPI  ____________________________________________   PHYSICAL EXAM:  VITAL SIGNS: ED Triage Vitals  Enc Vitals Group     BP 06/09/18 1950 (!) 165/100     Pulse Rate 06/09/18 1950 (!) 109     Resp 06/09/18 1950 18     Temp 06/09/18 1950 98.7 F (37.1 C)     Temp Source 06/09/18 1950 Oral     SpO2 06/09/18 1950 97 %     Weight 06/09/18 1933 (!) 335 lb (152 kg)     Height 06/09/18 1933 6\' 2"  (1.88 m)     Head Circumference --      Peak Flow --      Pain Score 06/09/18 1933 10     Pain Loc --      Pain Edu? --      Excl. in [place]? --    Constitutional: Alert and oriented. Well appearing and in no distress. Musculoskeletal: Pain with range of motion of the right wrist, mild dorsal swelling to the right wrist is noted, no erythema Neurologic:  Normal speech and language. No gross focal neurologic deficits are appreciated.  Skin:  Skin is warm, dry  and intact. No rash noted. Psychiatric: Mood and affect are normal. Speech and behavior are normal.  ___________________________________________  ED COURSE:  As part of my medical decision making, I reviewed the following data within the [place] History obtained from family if available, nursing notes, old chart and ekg, as well as notes from prior ED visits. Patient presented for right wrist pain after peripheral IV insertion, we will assess with imaging as indicated at this time.   Procedures ____________________________________________   RADIOLOGY Images were viewed by me Right wrist  ultrasound IMPRESSION: Superficial thrombus is demonstrated in the right cephalic vein. Deep veins are patent.   ____________________________________________   DIFFERENTIAL DIAGNOSIS   DVT, superficial phlebitis, cellulitis  FINAL ASSESSMENT AND PLAN  Superficial  phlebitis   Plan: The patient had presented for right wrist pain. Patient's imaging did reveal a superficial thrombus in the right cephalic vein.  No obvious DVT was noted.  He will be discharged with pain medicine and outpatient follow-up as needed.   Laurence Aly, MD    Note: This note was generated in part or whole with voice recognition software. Voice recognition is usually quite accurate but there are transcription errors that can and very often do occur. I apologize for any typographical errors that were not detected and corrected.     Earleen Newport, MD 06/09/18 2059

## 2018-06-10 MED ORDER — UNIFINE PENTIPS 31 GAUGE X 5/16" NEEDLE
3 refills | 0 days | Status: CP
Start: 2018-06-10 — End: 2019-06-10
  Filled 2018-06-11: qty 400, 80d supply, fill #0

## 2018-06-10 MED ORDER — TICAGRELOR 90 MG TABLET
ORAL_TABLET | ORAL | 3 refills | 0 days | Status: CP
Start: 2018-06-10 — End: 2019-06-10
  Filled 2018-06-11: qty 180, 90d supply, fill #0

## 2018-06-10 MED ORDER — AMIODARONE 200 MG TABLET
ORAL_TABLET | ORAL | 3 refills | 0 days | Status: CP
Start: 2018-06-10 — End: 2019-06-10
  Filled 2018-06-11: qty 90, 90d supply, fill #0

## 2018-06-11 MED FILL — XARELTO 15 MG TABLET: 90 days supply | Qty: 90 | Fill #0 | Status: AC

## 2018-06-11 MED FILL — ISOSORBIDE MONONITRATE ER 30 MG TABLET,EXTENDED RELEASE 24 HR: ORAL | 90 days supply | Qty: 90 | Fill #1

## 2018-06-11 MED FILL — EZETIMIBE 10 MG TABLET: 90 days supply | Qty: 90 | Fill #1 | Status: AC

## 2018-06-11 MED FILL — ATORVASTATIN 80 MG TABLET: 90 days supply | Qty: 90 | Fill #1 | Status: AC

## 2018-06-11 MED FILL — CARVEDILOL 25 MG TABLET: 90 days supply | Qty: 270 | Fill #1 | Status: AC

## 2018-06-11 MED FILL — ALLOPURINOL 100 MG TABLET: ORAL | 88 days supply | Qty: 22 | Fill #1

## 2018-06-11 MED FILL — EZETIMIBE 10 MG TABLET: ORAL | 90 days supply | Qty: 90 | Fill #1

## 2018-06-11 MED FILL — ATORVASTATIN 80 MG TABLET: ORAL | 90 days supply | Qty: 90 | Fill #1

## 2018-06-11 MED FILL — CARVEDILOL 25 MG TABLET: 90 days supply | Qty: 270 | Fill #1

## 2018-06-11 MED FILL — ISOSORBIDE MONONITRATE ER 30 MG TABLET,EXTENDED RELEASE 24 HR: 90 days supply | Qty: 90 | Fill #1 | Status: AC

## 2018-06-11 MED FILL — AMIODARONE 200 MG TABLET: 90 days supply | Qty: 90 | Fill #0 | Status: AC

## 2018-06-11 MED FILL — UNIFINE PENTIPS 31 GAUGE X 5/16" NEEDLE: 80 days supply | Qty: 400 | Fill #0 | Status: AC

## 2018-06-11 MED FILL — BRILINTA 90 MG TABLET: 90 days supply | Qty: 180 | Fill #0 | Status: AC

## 2018-06-11 MED FILL — ALLOPURINOL 100 MG TABLET: 88 days supply | Qty: 22 | Fill #1 | Status: AC

## 2018-06-14 DIAGNOSIS — I2510 Atherosclerotic heart disease of native coronary artery without angina pectoris: Principal | ICD-10-CM

## 2018-06-14 DIAGNOSIS — Z6841 Body Mass Index (BMI) 40.0 and over, adult: Principal | ICD-10-CM

## 2018-06-14 DIAGNOSIS — G459 Transient cerebral ischemic attack, unspecified: Principal | ICD-10-CM

## 2018-06-14 DIAGNOSIS — I10 Essential (primary) hypertension: Principal | ICD-10-CM

## 2018-06-14 DIAGNOSIS — E1122 Type 2 diabetes mellitus with diabetic chronic kidney disease: Principal | ICD-10-CM

## 2018-06-14 DIAGNOSIS — R569 Unspecified convulsions: Principal | ICD-10-CM

## 2018-06-14 DIAGNOSIS — N184 Chronic kidney disease, stage 4 (severe): Principal | ICD-10-CM

## 2018-06-14 DIAGNOSIS — J45909 Unspecified asthma, uncomplicated: Principal | ICD-10-CM

## 2018-06-14 DIAGNOSIS — N200 Calculus of kidney: Principal | ICD-10-CM

## 2018-06-14 DIAGNOSIS — F329 Major depressive disorder, single episode, unspecified: Principal | ICD-10-CM

## 2018-06-14 DIAGNOSIS — Z550 Illiteracy and low-level literacy: Principal | ICD-10-CM

## 2018-06-14 DIAGNOSIS — I4892 Unspecified atrial flutter: Principal | ICD-10-CM

## 2018-06-14 DIAGNOSIS — Z7901 Long term (current) use of anticoagulants: Principal | ICD-10-CM

## 2018-06-14 DIAGNOSIS — G894 Chronic pain syndrome: Principal | ICD-10-CM

## 2018-06-14 DIAGNOSIS — M1990 Unspecified osteoarthritis, unspecified site: Principal | ICD-10-CM

## 2018-06-14 DIAGNOSIS — E119 Type 2 diabetes mellitus without complications: Principal | ICD-10-CM

## 2018-06-14 DIAGNOSIS — E1142 Type 2 diabetes mellitus with diabetic polyneuropathy: Principal | ICD-10-CM

## 2018-06-14 DIAGNOSIS — F410 Panic disorder [episodic paroxysmal anxiety] without agoraphobia: Principal | ICD-10-CM

## 2018-06-14 DIAGNOSIS — G4733 Obstructive sleep apnea (adult) (pediatric): Principal | ICD-10-CM

## 2018-06-14 DIAGNOSIS — G43909 Migraine, unspecified, not intractable, without status migrainosus: Principal | ICD-10-CM

## 2018-06-14 DIAGNOSIS — R3129 Other microscopic hematuria: Principal | ICD-10-CM

## 2018-06-14 DIAGNOSIS — E785 Hyperlipidemia, unspecified: Principal | ICD-10-CM

## 2018-06-14 DIAGNOSIS — E1321 Other specified diabetes mellitus with diabetic nephropathy: Principal | ICD-10-CM

## 2018-06-14 DIAGNOSIS — I4891 Unspecified atrial fibrillation: Principal | ICD-10-CM

## 2018-06-14 DIAGNOSIS — K219 Gastro-esophageal reflux disease without esophagitis: Principal | ICD-10-CM

## 2018-06-14 DIAGNOSIS — I5022 Chronic systolic (congestive) heart failure: Principal | ICD-10-CM

## 2018-06-14 DIAGNOSIS — I208 Other forms of angina pectoris: Principal | ICD-10-CM

## 2018-06-14 DIAGNOSIS — I5042 Chronic combined systolic (congestive) and diastolic (congestive) heart failure: Principal | ICD-10-CM

## 2018-06-14 DIAGNOSIS — I214 Non-ST elevation (NSTEMI) myocardial infarction: Principal | ICD-10-CM

## 2018-06-14 DIAGNOSIS — K760 Fatty (change of) liver, not elsewhere classified: Principal | ICD-10-CM

## 2018-06-14 DIAGNOSIS — S0590XA Unspecified injury of unspecified eye and orbit, initial encounter: Principal | ICD-10-CM

## 2018-06-14 DIAGNOSIS — Z590 Homelessness: Principal | ICD-10-CM

## 2018-06-14 DIAGNOSIS — Z794 Long term (current) use of insulin: Principal | ICD-10-CM

## 2018-06-14 DIAGNOSIS — N183 Chronic kidney disease, stage 3 (moderate): Principal | ICD-10-CM

## 2018-06-14 DIAGNOSIS — M109 Gout, unspecified: Principal | ICD-10-CM

## 2018-06-14 DIAGNOSIS — E669 Obesity, unspecified: Principal | ICD-10-CM

## 2018-06-14 NOTE — Unmapped (Signed)
CCM Intake call:    PCP Action:None     CCM Action:None    Patient Action:call CCM, [facility] or HH for any concerns.      Follow up/Discuss next call: r. Arm pain/DVT, bruising. DM, HF, CAD       Jeremy Oliver states Jeremy Oliver was diagnosed with DVT in r. Upper arm @   Pt is home now and states the pain in r. Arm is getting better.    Bruising all over r. Arm and hands d/t many peripheral sticks while in ED/[facility] this time and last time/ED [facility].    No symptoms re: Covid 19    Pt in good spirits, keeping appt 06/29/18 until hears otherwise.    Encouraged pt to take all medications as prescribed. Pt states has HH visiting him now: PT/OT/RN and maybe SW.    States has a machine being delivered to him that gives wt, pulse ox and BP    [facility] PT OT RN possibly SW visiting pt daily.  [facility] at [phone].

## 2018-06-14 NOTE — Unmapped (Signed)
Alina with [facility] needs a verbal for skilled nursing for 2 times a week for 2 weeks and then 1 time a week for 3 weeks.  Her # is [phone]

## 2018-06-15 ENCOUNTER — Emergency Department: Admit: 2018-06-15 | Discharge: 2018-06-15 | Disposition: A | Discharge status: Home / Self Care | Payer: MEDICARE

## 2018-06-15 ENCOUNTER — Ambulatory Visit: Admit: 2018-06-15 | Discharge: 2018-06-15 | Disposition: A | Discharge status: Home / Self Care | Payer: MEDICARE

## 2018-06-15 DIAGNOSIS — G459 Transient cerebral ischemic attack, unspecified: Principal | ICD-10-CM

## 2018-06-15 DIAGNOSIS — M25531 Pain in right wrist: Principal | ICD-10-CM

## 2018-06-15 DIAGNOSIS — M25522 Pain in left elbow: Principal | ICD-10-CM

## 2018-06-15 DIAGNOSIS — E875 Hyperkalemia: Principal | ICD-10-CM

## 2018-06-15 DIAGNOSIS — Z5329 Procedure and treatment not carried out because of patient's decision for other reasons: Principal | ICD-10-CM

## 2018-06-15 DIAGNOSIS — I4891 Unspecified atrial fibrillation: Principal | ICD-10-CM

## 2018-06-15 DIAGNOSIS — E1121 Type 2 diabetes mellitus with diabetic nephropathy: Principal | ICD-10-CM

## 2018-06-15 DIAGNOSIS — I252 Old myocardial infarction: Principal | ICD-10-CM

## 2018-06-15 DIAGNOSIS — E1122 Type 2 diabetes mellitus with diabetic chronic kidney disease: Principal | ICD-10-CM

## 2018-06-15 DIAGNOSIS — I130 Hypertensive heart and chronic kidney disease with heart failure and stage 1 through stage 4 chronic kidney disease, or unspecified chronic kidney disease: Principal | ICD-10-CM

## 2018-06-15 DIAGNOSIS — K219 Gastro-esophageal reflux disease without esophagitis: Principal | ICD-10-CM

## 2018-06-15 DIAGNOSIS — I809 Phlebitis and thrombophlebitis of unspecified site: Principal | ICD-10-CM

## 2018-06-15 DIAGNOSIS — Z950 Presence of cardiac pacemaker: Principal | ICD-10-CM

## 2018-06-15 DIAGNOSIS — Z7901 Long term (current) use of anticoagulants: Principal | ICD-10-CM

## 2018-06-15 DIAGNOSIS — I5022 Chronic systolic (congestive) heart failure: Principal | ICD-10-CM

## 2018-06-15 DIAGNOSIS — Z550 Illiteracy and low-level literacy: Principal | ICD-10-CM

## 2018-06-15 DIAGNOSIS — E1321 Other specified diabetes mellitus with diabetic nephropathy: Principal | ICD-10-CM

## 2018-06-15 DIAGNOSIS — I4892 Unspecified atrial flutter: Principal | ICD-10-CM

## 2018-06-15 DIAGNOSIS — M79631 Pain in right forearm: Principal | ICD-10-CM

## 2018-06-15 DIAGNOSIS — I214 Non-ST elevation (NSTEMI) myocardial infarction: Principal | ICD-10-CM

## 2018-06-15 DIAGNOSIS — M25521 Pain in right elbow: Principal | ICD-10-CM

## 2018-06-15 DIAGNOSIS — E785 Hyperlipidemia, unspecified: Principal | ICD-10-CM

## 2018-06-15 DIAGNOSIS — N200 Calculus of kidney: Principal | ICD-10-CM

## 2018-06-15 DIAGNOSIS — M1990 Unspecified osteoarthritis, unspecified site: Principal | ICD-10-CM

## 2018-06-15 DIAGNOSIS — J45909 Unspecified asthma, uncomplicated: Principal | ICD-10-CM

## 2018-06-15 DIAGNOSIS — Z590 Homelessness: Principal | ICD-10-CM

## 2018-06-15 DIAGNOSIS — I10 Essential (primary) hypertension: Principal | ICD-10-CM

## 2018-06-15 DIAGNOSIS — Z888 Allergy status to other drugs, medicaments and biological substances status: Principal | ICD-10-CM

## 2018-06-15 DIAGNOSIS — E669 Obesity, unspecified: Principal | ICD-10-CM

## 2018-06-15 DIAGNOSIS — E1165 Type 2 diabetes mellitus with hyperglycemia: Principal | ICD-10-CM

## 2018-06-15 DIAGNOSIS — E119 Type 2 diabetes mellitus without complications: Principal | ICD-10-CM

## 2018-06-15 DIAGNOSIS — N183 Chronic kidney disease, stage 3 (moderate): Principal | ICD-10-CM

## 2018-06-15 DIAGNOSIS — G894 Chronic pain syndrome: Principal | ICD-10-CM

## 2018-06-15 DIAGNOSIS — I82611 Acute embolism and thrombosis of superficial veins of right upper extremity: Principal | ICD-10-CM

## 2018-06-15 DIAGNOSIS — I25119 Atherosclerotic heart disease of native coronary artery with unspecified angina pectoris: Principal | ICD-10-CM

## 2018-06-15 DIAGNOSIS — Z8673 Personal history of transient ischemic attack (TIA), and cerebral infarction without residual deficits: Principal | ICD-10-CM

## 2018-06-15 DIAGNOSIS — E1142 Type 2 diabetes mellitus with diabetic polyneuropathy: Principal | ICD-10-CM

## 2018-06-15 DIAGNOSIS — R3129 Other microscopic hematuria: Principal | ICD-10-CM

## 2018-06-15 DIAGNOSIS — K760 Fatty (change of) liver, not elsewhere classified: Principal | ICD-10-CM

## 2018-06-15 DIAGNOSIS — I208 Other forms of angina pectoris: Principal | ICD-10-CM

## 2018-06-15 DIAGNOSIS — Z885 Allergy status to narcotic agent status: Principal | ICD-10-CM

## 2018-06-15 DIAGNOSIS — S0590XA Unspecified injury of unspecified eye and orbit, initial encounter: Principal | ICD-10-CM

## 2018-06-15 DIAGNOSIS — N184 Chronic kidney disease, stage 4 (severe): Principal | ICD-10-CM

## 2018-06-15 DIAGNOSIS — G4733 Obstructive sleep apnea (adult) (pediatric): Principal | ICD-10-CM

## 2018-06-15 DIAGNOSIS — F329 Major depressive disorder, single episode, unspecified: Principal | ICD-10-CM

## 2018-06-15 DIAGNOSIS — M109 Gout, unspecified: Principal | ICD-10-CM

## 2018-06-15 DIAGNOSIS — G43909 Migraine, unspecified, not intractable, without status migrainosus: Principal | ICD-10-CM

## 2018-06-15 DIAGNOSIS — R569 Unspecified convulsions: Principal | ICD-10-CM

## 2018-06-15 DIAGNOSIS — F410 Panic disorder [episodic paroxysmal anxiety] without agoraphobia: Principal | ICD-10-CM

## 2018-06-15 DIAGNOSIS — I2510 Atherosclerotic heart disease of native coronary artery without angina pectoris: Principal | ICD-10-CM

## 2018-06-15 DIAGNOSIS — Z6841 Body Mass Index (BMI) 40.0 and over, adult: Principal | ICD-10-CM

## 2018-06-15 LAB — BASIC METABOLIC PANEL
ANION GAP: 13 mmol/L (ref 7–15)
ANION GAP: 12 mmol/L (ref 7–15)
ANION GAP: 10 mmol/L (ref 7–15)
BLOOD UREA NITROGEN: 30 mg/dL — ABNORMAL HIGH (ref 7–21)
BLOOD UREA NITROGEN: 31 mg/dL — ABNORMAL HIGH (ref 7–21)
BLOOD UREA NITROGEN: 31 mg/dL — ABNORMAL HIGH (ref 7–21)
BUN / CREAT RATIO: 10
BUN / CREAT RATIO: 10
BUN / CREAT RATIO: 11
CALCIUM: 9.3 mg/dL (ref 8.5–10.2)
CALCIUM: 8.9 mg/dL (ref 8.5–10.2)
CHLORIDE: 105 mmol/L (ref 98–107)
CHLORIDE: 104 mmol/L (ref 98–107)
CHLORIDE: 106 mmol/L (ref 98–107)
CO2: 20 mmol/L — ABNORMAL LOW (ref 22.0–30.0)
CO2: 23 mmol/L (ref 22.0–30.0)
CO2: 22 mmol/L (ref 22.0–30.0)
CREATININE: 3.09 mg/dL — ABNORMAL HIGH (ref 0.70–1.30)
CREATININE: 2.87 mg/dL — ABNORMAL HIGH (ref 0.70–1.30)
EGFR CKD-EPI AA MALE: 27 mL/min/{1.73_m2} — ABNORMAL LOW (ref >=60)
EGFR CKD-EPI AA MALE: 26 mL/min/{1.73_m2} — ABNORMAL LOW (ref >=60)
EGFR CKD-EPI AA MALE: 28 mL/min/{1.73_m2} — ABNORMAL LOW (ref >=60)
EGFR CKD-EPI NON-AA MALE: 23 mL/min/{1.73_m2} — ABNORMAL LOW (ref >=60)
EGFR CKD-EPI NON-AA MALE: 22 mL/min/{1.73_m2} — ABNORMAL LOW (ref >=60)
EGFR CKD-EPI NON-AA MALE: 25 mL/min/{1.73_m2} — ABNORMAL LOW (ref >=60)
GLUCOSE RANDOM: 288 mg/dL — ABNORMAL HIGH (ref 65–179)
GLUCOSE RANDOM: 344 mg/dL — ABNORMAL HIGH (ref 65–179)
POTASSIUM: 6 mmol/L — ABNORMAL HIGH (ref 3.5–5.0)
POTASSIUM: 5.8 mmol/L — ABNORMAL HIGH (ref 3.5–5.0)
POTASSIUM: 6.1 mmol/L (ref 3.5–5.0)
SODIUM: 138 mmol/L (ref 135–145)
SODIUM: 139 mmol/L (ref 135–145)
SODIUM: 138 mmol/L (ref 135–145)

## 2018-06-15 LAB — CBC W/ AUTO DIFF
BASOPHILS ABSOLUTE COUNT: 0.1 10*9/L (ref 0.0–0.1)
BASOPHILS RELATIVE PERCENT: 0.6 %
EOSINOPHILS ABSOLUTE COUNT: 0.2 10*9/L (ref 0.0–0.4)
EOSINOPHILS RELATIVE PERCENT: 2.2 %
HEMATOCRIT: 36.3 % — ABNORMAL LOW (ref 41.0–53.0)
HEMOGLOBIN: 11.6 g/dL — ABNORMAL LOW (ref 13.5–17.5)
LARGE UNSTAINED CELLS: 1 % (ref 0–4)
LYMPHOCYTES ABSOLUTE COUNT: 1.8 10*9/L (ref 1.5–5.0)
LYMPHOCYTES RELATIVE PERCENT: 20.3 %
MEAN CORPUSCULAR HEMOGLOBIN CONC: 32.1 g/dL (ref 31.0–37.0)
MEAN CORPUSCULAR HEMOGLOBIN: 27.2 pg (ref 26.0–34.0)
MEAN CORPUSCULAR VOLUME: 84.9 fL (ref 80.0–100.0)
MEAN PLATELET VOLUME: 7.4 fL (ref 7.0–10.0)
MONOCYTES ABSOLUTE COUNT: 0.3 10*9/L (ref 0.2–0.8)
MONOCYTES RELATIVE PERCENT: 3.6 %
NEUTROPHILS RELATIVE PERCENT: 72.1 %
PLATELET COUNT: 380 10*9/L (ref 150–440)
RED BLOOD CELL COUNT: 4.27 10*12/L — ABNORMAL LOW (ref 4.50–5.90)
RED CELL DISTRIBUTION WIDTH: 15.2 % — ABNORMAL HIGH (ref 12.0–15.0)
WBC ADJUSTED: 8.7 10*9/L (ref 4.5–11.0)

## 2018-06-15 MED FILL — OXYCODONE-ACETAMINOPHEN 5 MG-325 MG TABLET: ORAL | 4 days supply | Qty: 12 | Fill #0

## 2018-06-15 MED FILL — VENTOLIN HFA 90 MCG/ACTUATION AEROSOL INHALER: RESPIRATORY_TRACT | 25 days supply | Qty: 18 | Fill #2

## 2018-06-15 MED FILL — QUETIAPINE 50 MG TABLET: 75 days supply | Qty: 150 | Fill #1 | Status: AC

## 2018-06-15 MED FILL — VENTOLIN HFA 90 MCG/ACTUATION AEROSOL INHALER: 25 days supply | Qty: 18 | Fill #2 | Status: AC

## 2018-06-15 MED FILL — RENVELA 800 MG TABLET: 30 days supply | Qty: 180 | Fill #1

## 2018-06-15 MED FILL — NOVOLOG FLEXPEN U-100 INSULIN ASPART 100 UNIT/ML (3 ML) SUBCUTANEOUS: 34 days supply | Qty: 15 | Fill #2 | Status: AC

## 2018-06-15 MED FILL — OXYCODONE-ACETAMINOPHEN 5 MG-325 MG TABLET: 4 days supply | Qty: 12 | Fill #0 | Status: AC

## 2018-06-15 MED FILL — RENVELA 800 MG TABLET: 30 days supply | Qty: 180 | Fill #1 | Status: AC

## 2018-06-15 MED FILL — NOVOLOG FLEXPEN U-100 INSULIN ASPART 100 UNIT/ML (3 ML) SUBCUTANEOUS: SUBCUTANEOUS | 34 days supply | Qty: 15 | Fill #2

## 2018-06-15 MED FILL — QUETIAPINE 50 MG TABLET: ORAL | 75 days supply | Qty: 150 | Fill #1

## 2018-06-15 NOTE — Unmapped (Signed)
Pt presents with wrist pain after having IV removed from right wrist. Pt was seen at [facility] for pain after IV removal and was told had a blood clot in right wrist. Pt with no new symptoms or concerns

## 2018-06-15 NOTE — Unmapped (Signed)
He says when he was in the hospital they put an IV in R wrist and it caused issues.  He says he was told by [facility] that he has a blood clot.  He is saying now he is having pain which is getting worse and seems to be moving in that R wrist.  Can you call him at [phone]?

## 2018-06-15 NOTE — Unmapped (Signed)
Care Management  Initial Transition Planning Assessment    Patient has been identified as a Runner, broadcasting/film/video.   Longitudinal Plan of Care reviewed, the following information is noted for continuity of care:  After being discharged 3/5, pt's wrist has been hurting where the IV site was. After being evicted from his home on 05/25/2018, pt and his wife have been living with their daughter, daughter's fiance, and 50 yr old son. Pt is still open with [facility] for RN/PT/OT/SW. Pt has a RW, Wheelchair, Paediatric nurse, cane. CPAP has been left at the storage or evicted house. Pt and wife are looking at a place in [place] they can rent for 700$/month and their dog is allowed.    Home Safety Screening  Patient Questions:    1.  Before the illness or injury that brought you to the ED, did you need someone to help you on a regular basis? Do you need people to help you with activities of daily living?  ?? No  2.  In the last 24 hours, have you needed more help than usual?  ?? No  3.  Have you been hospitalized for one or more nights during the past six months?   ?? Yes - per chart  4.  Have you been to an emergency room one or more times in the past six months?  ?? Yes - per chart  5.  In general, do you have problems with your vision that cannot be corrected with glasses/contacts?  ?? No  6.  In general, do you have problems with your memory?  ?? No  7.  Do you take six or more different medications every day?   ?? Yes - per chart  8. Do you have any trouble getting, taking, keeping up with or paying for your medications?   ?? No  9   Have you had any falls over the past few weeks or are you concerned about falling?  ?? No  10.  Are you having trouble accessing and/or preparing the meals you need?  ?? Yes - pt's daughter has been preparing the meals but not for the past 2 days. Pt has been getting fast food meals.  11. Do you have wounds or bed ulcers?  ?? No  12  Do you have access to transportation?    Yes - wife can drive him    Care Manager Questions:  Is the patient over the age of 56?  ?? No  *Home Safety Evaluation endorsed  4 of the screening items and will update personal health advocate. Personal Health Advocate Pool contacted by In Basket message regarding post-acute services and collaboration around plan of care.    When patient/family asked, Were the services I provided/discussed with you today of value?, patient/family responded Y/N? Yes - pt appreciates the visit                General  Care Manager assessed the patient by : In person interview with patient  Orientation Level: Oriented X4  Who provides care at home?: N/A  Reason for referral: Discharge Planning  Additional Information: ACO pt   Type of Residence: Mailing Address:  [address]  [place]  [place] [address]  Contacts: Accompanied by: Significant other   Extended Emergency Contact Information  Primary Emergency Contact: Skidgel,[name]  Address: [address]           [place], [place] [address] [place]  Home Phone: [phone]  Mobile Phone: [phone]  Relation: Spouse  Secondary Emergency Contact: Faidley,Bridgette   [place]  Home Phone: [phone]  Mobile Phone: [phone]  Relation: Daughter  Patient Phone Number: [phone] (home)         Medical Provider(s): Kurtis Bushman, MD  Reason for Admission: Admitting Diagnosis:  No admission diagnoses are documented for this encounter.  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  Past Surgical History:   has a past surgical history that includes Knee surgery (Right, 09/23/2006); Cardiac catheterization (03/08/2007); Cystourethroscopy (12/31/2009 ); pr inser hart pacer xvenous atrial (N/A, 05/30/2015); pr ephys eval w/ ablation supravent arrhythmia (N/A, 07/19/2015); pr upper gi endoscopy,biopsy (N/A, 02/28/2016); pr upper gi endoscopy,diagnosis (N/A, 05/07/2016); pr cath place/coron angio, img super/interp,r&l hrt cath, l hrt ventric (N/A, 02/13/2017); pr cath place/coron angio, img super/interp,w left heart ventriculography (N/A, 03/17/2017); pr insert/place flow direct cath (N/A, 03/17/2017); pr ecmo/ecls initiation veno-arterial (N/A, 03/19/2017); pr rebl ves direct,low extrem (Left, 03/19/2017); pr ecmo/ecls rmvl prph cannula open 6 yrs & older (Left, 03/25/2017); pr remv art clot iliac-pop,leg incis (Left, 03/25/2017); pr negative pressure wound therapy dme >50 sq cm (Left, 03/25/2017); pr insj/rplcmt perm dfb w/trnsvns lds 1/dual chmbr (N/A, 05/04/2017); pr prq trluml coronary stent w/angio one art/brnch (N/A, 03/12/2018); and pr cath place/coron angio, img super/interp,w left heart ventriculography (N/A, 05/12/2018).   Previous admit date: 06/02/2018    Primary Insurance- Payor: MEDICARE / Plan: MEDICARE PART A AND PART B / Product Type: *No Product type* /   Secondary Insurance ??? Secondary Insurance  MEDICAID [place]  Prescription Coverage ??? same as above  Preferred Pharmacy - [facility] - [place], [place] - [address]  [facility]  [id number] - [place] - [address]  [facility] - [place] - [address]  [facility] [place] - [address]  [facility]  [facility]  [facility] - [place] ([place]), [place] - [address]    Transportation home: Private vehicle  Level of function prior to admission: Independent    Contact/Decision Programmer, applications Information  Primary Emergency Contact: Jerger,[name]  Address: [address] [place], [place] [address] [place]  Home Phone: [phone]  Mobile Phone: [phone]  Relation: Spouse  Secondary Emergency Contact: Milius,Bridgette   [place]  Home Phone: [phone]  Mobile Phone: [phone]  Relation: Daughter    Legal Next of Kin / Guardian / POA / Advance Directives     [name]): Dechert,[name] - [phone]    [name]): Salinger,Bridgette - Daughter - [phone]    Advance Directive (Medical Treatment)  Does patient  have an advance directive covering medical treatment?: Patient has advance directive covering medical treatment, copy in chart.  Information provided on advance directive:: No  Patient requests assistance:: No    Patient Information  Lives with: Spouse/significant other, Children(Pt and his wife have living with their daughter since February 25th after being evicted from their home. Most of their stuff have been left behind including his CPAP. Daughter's fiance and 1 yr old son also live there.)    Type of Residence: Private residence     Location/Detail: [address] [place], [place] [address]; Phone: [phone]    Support Systems: Children, Family Members, Spouse    Responsibilities/Dependents at home?: No    Home Care services in place prior to admission?: Yes  Type of Home Care services in place prior to admission: Home nursing visits, Home PT, Home OT, Other (Comment)  Current Home Care provider (Name/Phone #): [facility] RN/PT/OT/SW    Outpatient/Community Resources in place prior to admission: DSS, Clinic  Agency detail (Name/Phone #): Heart Track program at [facility]; DSS of [facility] for food & nutrition services. PCP: Wilhemena Durie [phone]    Equipment Currently Used at Home: cane, straight, walker, rolling, wheelchair, manual(CPAP left behind their evicted house )    Currently receiving outpatient dialysis?: No    Financial Information    Need for financial assistance?: Yes  Type of financial assistance required: Community resources    Social Determinants of Health  Social Determinants of Health were addressed in provider documentation.  Please refer to patient history.    Discharge Needs Assessment  Concerns to be Addressed: financial/insurance, homelessness    Clinical Risk Factors: New Diagnosis    Barriers to taking medications: No    Prior overnight hospital stay or ED visit in last 90 days: Yes    Readmission Within the Last 30 Days: current reason for admission unrelated to previous admission    Anticipated Changes Related to Illness: none    Equipment Needed After Discharge: none    Discharge Facility/Level of Care Needs: other (see comments)(Continue with [facility])    Readmission  Risk of Unplanned Readmission Score:  %  Predictive Model Details   No score data available for [facility] Risk of Unplanned Readmission     Readmitted Within the Last 30 Days? (No if blank)   Patient at risk for readmission?: Yes    Discharge Plan  Screen findings are: Discharge planning needs identified or anticipated (Comment).    Expected Discharge Date:      Expected Transfer from Critical Care:      Patient and/or family were provided with choice of facilities / services that are available and appropriate to meet post hospital care needs?: Yes   List choices in order highest to lowest preferred, if applicable. : To continue with [facility]. Has has RN/PT/OT/SW.    Initial Assessment complete?: Yes

## 2018-06-15 NOTE — Unmapped (Signed)
Called and gave telephone orders for skilled nursing twice weekly for 2 weeks and once a week for 3 weeks per Dr. Hulan Fess.

## 2018-06-15 NOTE — Unmapped (Signed)
[facility]  Emergency Department Provider Note      ED Clinical Impression     Final diagnoses:   Pain of right forearm (Primary)       Initial Impression, ED Course, Assessment and Plan     ED Triage Vitals [06/15/18 1245]   Vital Signs Group      Temp 36.2 ??C (97.2 ??F)      Temp Source Oral      Pulse       SpO2 Pulse 101      Heart Rate Source       Resp 18      BP 231/113      MAP (mmHg)       BP Location       BP Method       Patient Position    SpO2 96 %       Past Medical History:   Diagnosis Date   ??? Angina at rest (CMS-[name])    ??? Arthritis    ??? Asthma    ??? Atrial fibrillation and flutter (CMS-[name]) 06/08/14   ??? BMI 40.0-44.9, adult (CMS-[name]) 10/29/2015   ??? Chronic anticoagulation 02/17/2018   ??? Chronic kidney disease, stage 3 (CMS-[name])    ??? Chronic pain syndrome 04/11/2014    ~Use daily lyrica as recommended ~Tramadol as needed for pain ~Fluoxetine daily as recommended ~Daily exercise ~Eat a healthy diet ~Good control of your blood sugars can help    ??? Coronary artery disease    ??? Depression    ??? Diabetes mellitus (CMS-[name])    ??? Eye trauma 1998    glass in both eyes and removed   ??? GERD (gastroesophageal reflux disease)    ??? Gout    ??? Homeless    ??? Hyperlipidemia    ??? Hypertension    ??? Illiteracy and low-level literacy    ??? Microscopic hematuria    ??? Migraine    ??? Nephrolithiasis    ??? 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 Nonalcoholic fatty liver disease    ??? NSTEMI (non-ST elevated myocardial infarction) (CMS-[name]) 01/13/2017   ??? Obesity    ??? Obstructive sleep apnea 2009 ??? Panic attacks    ??? 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 Seizure-like activity (CMS-[name])     ~Monitor for symptoms and report them to your primary care provider if they occur    ??? Systolic CHF, chronic (CMS-[name])     EF 40-45% 2017   ??? TIA (transient ischemic attack)        Impression: Jeremy Route Butters is a 48 y.o. male with a PMH of arthritis, asthma, a-fib (on xarelto and brilinta), stage 4 CKD, CAD, T2DM, GERD, gout, HTN, HLD, nephrolithiasis, NSTEMI, obesity, OSA, systolic CHF (EF 45-40% on echo 06/02/18), and TIA who presents to the ED with 2 weeks of worsening right wrist pain radiating up right arm. Patient reports that he was at the [facility] 2 weeks ago when he had an episode of syncope and had an IV placed in his right wrist. He states that since  having the IV removed, he has had worsening right wrist pain. Patient reports that he was seen at [facility] on 06/09/18 (1 week ago) and diagnosed with superficial blood clot. He states that since this visit he has had spreading of his pain up his right arm to his elbow and has started to have numbness in his right 4th and 5th digits. Denies recent falls or trauma. Denies fevers, cough, congestion, abdominal pain, nausea, vomiting, diarrhea, chest pain, shortness of breath, or other concerning symptoms. Per review of Care Everywhere, right wrist [place] from [facility] on 06/09/18 showed: Superficial thrombus is demonstrated in the right cephalic vein. Deep veins are patent. He was discharged home with prescription for percocet which he states he has not been able to pick up from the pharmacy yet.    On exam, patient is nontoxic appearing and in NAD. VS unremarkable. Heart rate and rhythm normal Lungs CTAB. Abdomen soft and nontender. 2+ radial pulses bilaterally. 2+ ulnar pulses bilaterally.       ED Course as of Jun 14 1901   Tue Jun 15, 2018   1308 Patient has full proximal range of motion of bilateral wrists, and elbows despite pain over L forearm, patient has tenderness palpation over the distal forearm, no palpable wrist effusion, patient has no open wound, no significant swelling, no palpable fluctuance or induration, patient has full motor function of the median, radial, and ulnar nerve distribution, patient has decreased sensation to light touch to the ulnar distribution of the right hand compared to the left.  Patient has no tenderness palpation of the biceps or triceps.  Compartments soft and compressible.  No concern for compartment syndrome.  Patient has a cap refill less than 2 seconds bilateral hands, hands feel warm.  Do not think patient has a arterial vascular compromise.    Unclear etiology of the patient symptoms.  Unknown if this is related to the IV placement or not.  Patient has no recurrent trauma after that.  Patient had a ultrasound performed on 3/11 at outside hospital which suggest a superficial cephalic vein thrombus without any deep vein involvement.  Will repeat ultrasound today      1559 Patient has reassuring PVL, x-ray negative for fracture or dislocation.  Patient has good range of motion of the bilateral wrists and elbows, offered patient's wrist tap, patient politely declined.  Discussed with patient in terms of treatment plan today and the agreement is we will focus on controlling his symptoms and possible Ortho referral.      1743 I had a very long conversation with the patient and the patient's wife at the bedside.  I explained the rationale of recommending patient to be admitted due to the potassium level and explained the concern for possible arrhythmia if the potassium to keep going up without temporizing treatment at home.  We also discussed the imaging findings today, EKG, as well as how to take care of of his pain at home including the use of Percocet that he already has prescription for together with Tylenol.  Patient is fully aware of the contact that we discussed, and insisted on going home, politely declined admission.  Patient verbalized understanding the risk of leaving hospital today including sudden cardiac arrhythmia which could be potentially life-threatening.  As mentioned before, I offered a wrist, patient politely declined.  We will discharge patient with orthopedic follow-up referral, and patient said he will follow-up with his doctor or physical therapy in about 2 days for a repeat potassium  check.  I provided very strict return precautions, patient and wife at bedside verbalized understanding.  Without being said, given patient had a similar potassium issue in the past and no active EKG changes today in the ED, particularly, no peaked T waves, no QRS prolonged ration, no sign wave, I do not think patient needs to be AMA.  Will respect patient's preference at this point.  I answered all questions.  Patient is comfortable going home.          Additional Medical Decision Making     I have reviewed the vital signs and the nursing notes. Labs and radiology results that were available during my care of the patient were independently reviewed by me and considered in my medical decision making.     Case discussed with ED attending Dr. Neva Seat who is agreeable with the plan.    ____________________________________________       History     Chief Complaint  Wrist Pain      HPI   Jeremy Oliver is a 48 y.o. male with a PMH of arthritis, asthma, a-fib (on xarelto and brilinta), stage 4 CKD, CAD, T2DM, GERD, gout, HTN, HLD, nephrolithiasis, NSTEMI, obesity, OSA, systolic CHF (EF 16-10% on echo 06/02/18), and TIA who presents to the ED with 2 weeks of worsening right wrist pain radiating up right arm. Patient reports that he was at the [facility] 2 weeks ago when he had an episode of syncope and had an IV placed in his right wrist. He states that since having the IV removed, he has had worsening right wrist pain. Patient reports that he was seen at [facility] on 06/09/18 (1 week ago) and diagnosed with superficial blood clot. He states that since this visit he has had spreading of his pain up his right arm to his elbow and has started to have numbness in his right 4th and 5th digits. Denies recent falls or trauma. Denies fevers, cough, congestion, abdominal pain, nausea, vomiting, diarrhea, chest pain, shortness of breath, or other concerning symptoms. Per review of Care Everywhere, right wrist [place] from [facility] on 06/09/18 showed: Superficial thrombus is demonstrated in the right cephalic vein. Deep veins are patent. He was discharged home with prescription for percocet which he states he has not been able to pick up from the pharmacy yet.      Review of Systems:  Constitutional: no fever.  Eyes: no visual changes.  ENT: no sore throat.  Cardiovascular: no chest pain.  Respiratory: no shortness of breath.  Gastrointestinal: no abdominal pain, no vomiting, no diarrhea.  Genitourinary: no dysuria.  Musculoskeletal: + right wrist pain. no back pain.  Skin: no rash.  Neurological: + right 4th and 5th digit numbness. no headaches.    Past Medical History:   Diagnosis Date   ??? Angina at rest (CMS-[name])    ??? Arthritis    ??? Asthma    ??? Atrial fibrillation and flutter (CMS-[name]) 06/08/14   ??? BMI 40.0-44.9, adult (CMS-[name]) 10/29/2015   ??? Chronic anticoagulation 02/17/2018   ??? Chronic kidney disease, stage 3 (CMS-[name])    ??? Chronic pain syndrome 04/11/2014    ~Use daily lyrica as recommended ~Tramadol as needed for pain ~Fluoxetine daily as recommended ~Daily exercise ~Eat a healthy diet ~Good control of your blood sugars can help    ??? Coronary artery disease    ??? Depression    ??? Diabetes mellitus (CMS-[name])    ??? Eye trauma 1998    glass in both  eyes and removed   ??? GERD (gastroesophageal reflux disease)    ??? Gout    ??? Homeless    ??? Hyperlipidemia    ??? Hypertension    ??? Illiteracy and low-level literacy    ??? Microscopic hematuria    ??? Migraine    ??? Nephrolithiasis    ??? 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 Nonalcoholic fatty liver disease    ??? NSTEMI (non-ST elevated myocardial infarction) (CMS-[name]) 01/13/2017   ??? Obesity    ??? Obstructive sleep apnea 2009   ??? Panic attacks    ??? 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 Seizure-like activity (CMS-[name])     ~Monitor for symptoms and report them to your primary care provider if they occur    ??? Systolic CHF, chronic (CMS-[name])     EF 40-45% 2017   ??? TIA (transient ischemic attack)        Patient Active Problem List   Diagnosis   ??? CAD (coronary artery disease) (RAF-[name])   ??? Idiopathic chronic gout of left knee   ??? Hypercholesteremia   ??? Essential hypertension (RAF-[name])   ??? 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 Osteoarthritis   ??? Chronic nonalcoholic liver disease   ??? GERD (gastroesophageal reflux disease)   ??? Mild intermittent asthma without complication   ??? Chronic pain syndrome   ??? Paroxysmal atrial fibrillation (CMS-[name])   ??? OSA (obstructive sleep apnea)   ??? Syncope   ??? Tobacco use disorder   ??? Status post cardiac pacemaker procedure   ??? Type 2 diabetes mellitus with stage 4 chronic kidney disease, with long-term current use of insulin (CMS-[name])   ??? Cardiac pacemaker in situ   ??? Recurrent major depressive disorder, in full remission (CMS-[name])   ??? Adjustment disorder, unspecified   ??? Chronic heart failure with reduced ejection fraction and diastolic dysfunction (CMS-[name])   ??? Hyperkalemia   ??? CKD (chronic kidney disease) stage 4, GFR 15-29 ml/min (CMS-[name])   ??? Acquired left foot drop   ??? Decreased sensation of lower extremity, left   ??? Chronic anticoagulation       Past Surgical History:   Procedure Laterality Date   ??? CARDIAC CATHETERIZATION  03/08/2007   ??? CYSTOURETHROSCOPY  12/31/2009     Microscopic hematuria   ??? KNEE SURGERY Right 09/23/2006    arthroscopic knee surgery medial and lateral meniscus tears as well as crystal disease   ??? PR CATH PLACE/CORON ANGIO, IMG SUPER/INTERP,R&L HRT CATH, L HRT VENTRIC N/A 02/13/2017    Procedure: Left/Right Heart Catheterization;  Surgeon: Marlaine Hind, MD;  Location: [facility] CATH;  Service: Cardiology   ??? PR CATH PLACE/CORON ANGIO, IMG SUPER/INTERP,W LEFT HEART VENTRICULOGRAPHY N/A 03/17/2017    Procedure: Left Heart Catheterization W Intervention;  Surgeon: Marlaine Hind, MD;  Location: [facility] CATH;  Service: Cardiology   ??? PR CATH PLACE/CORON ANGIO, IMG SUPER/INTERP,W LEFT HEART VENTRICULOGRAPHY N/A 05/12/2018    Procedure: CATH LEFT HEART CATHETERIZATION W INTERVENTION;  Surgeon: Kennyth Lose  Marcine Matar, MD;  Location: [facility] CATH;  Service: Cardiology   ??? PR ECMO/ECLS INITIATION VENO-ARTERIAL N/A 03/19/2017    Procedure: ECMO/ECLS; INITIATION, VENO-ARTERIAL;  Surgeon: Lennie Odor, MD;  Location: MAIN OR [facility];  Service: Cardiac Surgery   ??? PR ECMO/ECLS RMVL PRPH CANNULA OPEN 6 YRS & OLDER Left 03/25/2017    Procedure: ECMO / ECLS PROVIDED BY PHYSICIAN; REMOVAL OF PERIPHERAL (ARTERIAL AND/OR VENOUS) CANNULA(E), OPEN, 6 YEARS AND OLDER;  Surgeon: Alonna Buckler Ikonomidis, MD;  Location: MAIN OR [facility];  Service: Cardiac Surgery   ??? PR EPHYS EVAL W/ ABLATION SUPRAVENT ARRHYTHMIA N/A 07/19/2015    Catheter ablation of cavotricuspid isthmus for atrial flutter [facility])   ??? PR INSER HART PACER XVENOUS ATRIAL N/A 05/30/2015    [employer] dual-chamber pacemaker implant (E.Chung)   ??? PR INSERT/PLACE FLOW DIRECT CATH N/A 03/17/2017    Procedure: Insert Leave In [place];  Surgeon: Marlaine Hind, MD;  Location: [facility] CATH;  Service: Cardiology   ??? PR INSJ/RPLCMT PERM DFB W/TRNSVNS LDS 1/DUAL CHMBR N/A 05/04/2017    Procedure: ICD Implant System (Single/Dual);  Surgeon: Eldred Manges, MD;  Location: [facility] CATH;  Service: Cardiology   ??? PR NEGATIVE PRESSURE WOUND THERAPY DME >50 SQ CM Left 03/25/2017    Procedure: Neg Press Wound Tx (Vac Assist) Incl Topicals, Per Session, Tsa Greater Than/= 50 Cm Squared;  Surgeon: Alonna Buckler Ikonomidis, MD;  Location: MAIN OR [facility];  Service: Cardiac Surgery   ??? PR PRQ TRLUML CORONARY STENT W/ANGIO ONE ART/BRNCH N/A 03/12/2018    Procedure: Percutaneous Coronary Intervention;  Surgeon: Marlaine Hind, MD;  Location: [facility] CATH;  Service: Cardiology   ??? PR REBL VES DIRECT,LOW EXTREM Left 03/19/2017    Procedure: Repr Bld Vessel Direct; Lower Extrem;  Surgeon: Boykin Reaper, MD;  Location: MAIN OR [facility];  Service: Vascular   ??? PR REMV ART CLOT ILIAC-POP,LEG INCIS Left 03/25/2017    Procedure: EMBOLECTOMY OR THROMBECTOMY, WITH OR WITHOUT CATHETER; FEMOROPOPLITEAL, AORTOILIAC ARTERY, BY LEG INCISION;  Surgeon: Alonna Buckler Ikonomidis, MD;  Location: MAIN OR [facility];  Service: Cardiac Surgery   ??? PR UPPER GI ENDOSCOPY,BIOPSY N/A 02/28/2016    Procedure: UGI ENDOSCOPY; WITH BIOPSY, SINGLE OR MULTIPLE;  Surgeon: Liane Comber, MD;  Location: [facility];  Service: Gastroenterology   ??? PR UPPER GI ENDOSCOPY,DIAGNOSIS N/A 05/07/2016    Procedure: UGI ENDO, INCLUDE ESOPHAGUS, STOMACH, & DUODENUM &/OR JEJUNUM; DX W/WO COLLECTION SPECIMN, BY BRUSH OR WASH;  Surgeon: Modena Nunnery, MD;  Location: GI [facility];  Service: Gastroenterology         Current Facility-Administered Medications:   ???  lidocaine (LIDODERM) 5 % patch 1 patch, 1 patch, Transdermal, Once, Raynald Blend, MD, 1 patch at 06/15/18 1330    Current Outpatient Medications:   ???  acetaminophen (TYLENOL) 500 MG tablet, TAKE 2 TABLETS (1 000MG ) BY MOUTH EVERY 8 HOURS AS NEEDED FOR PAIN, Disp: 180 each, Rfl: 3  ???  albuterol HFA 90 mcg/actuation inhaler, Inhale 2 puffs into the lungs every 6 (six) hours as needed for wheezing or shortness of breath., Disp: 18 g, Rfl: 2  ???  alirocumab (PRALUENT PEN) 75 mg/mL PnIj, INJECT THE CONTENTS OF 1 PEN (75 MG) UNDER THE SKIN EVERY 14 DAYS, Disp: 2 mL, Rfl: 11  ???  allopurinoL (ZYLOPRIM) 100 MG tablet, Take 0.5 tablets (50 mg total) by mouth every other day., Disp: 30 tablet, Rfl: 11  ???  amiodarone (PACERONE) 200 MG tablet, TAKE 1 TABLET  BY MOUTH ONCE DAILY, Disp: 90 tablet, Rfl: 3  ???  atorvastatin (LIPITOR) 80 MG tablet, Take 1 tablet (80 mg total) by mouth daily., Disp: 90 tablet, Rfl: 3  ???  blood sugar diagnostic Strp, USE TO CHECK BLOOD SUGAR 4 TIMES DAILY (BEFORE MEALS AND NIGHTLY), Disp: 100 each, Rfl: 3  ???  blood-glucose meter kit, Use to check blood sugars, #1 meter, #100 strips. Freestyle glucose meter. ICD-9 250.00, Disp: 1 each, Rfl: 0  ???  blood-glucose sensor (FREESTYLE NAVIGATOR GLUC SENS) Devi, Frequency:BID   Dosage:0.0     Instructions:  Note:Dose: 1, Disp: , Rfl:   ???  bumetanide (BUMEX) 2 MG tablet, TAKE 1 TABLET BY MOUTH ONCE DAILY, Disp: 90 tablet, Rfl: 3  ???  carvediloL (COREG) 25 MG tablet, TAKE 1 TABLET BY MOUTH WITH THE MORNING MEAL AND TAKE 2 TABLETS WITH THE EVENING MEAL, Disp: 270 tablet, Rfl: 3  ???  cholecalciferol, vitamin D3, 5,000 unit capsule, Take 1 capsule (5,000 Units total) by mouth daily., Disp: 100 capsule, Rfl: 0  ???  diclofenac sodium (VOLTAREN) 1 % gel, Apply 2 g topically Three (3) times a day as needed for pain., Disp: , Rfl:   ???  empty container Misc, Use as directed to dispose of injectable medications, Disp: 1 each, Rfl: 3  ???  ezetimibe (ZETIA) 10 mg tablet, TAKE 1 TABLET BY MOUTH ONCE DAILY, Disp: 90 each, Rfl: 3  ???  FLUoxetine (PROZAC) 20 MG capsule, Take 3 capsules (60 mg total) by mouth nightly., Disp: 270 capsule, Rfl: 3  ???  gabapentin (NEURONTIN) 300 MG capsule, Take 1 capsule (300 mg total) by mouth nightly., Disp: 30 capsule, Rfl: 0  ???  insulin ASPART (NOVOLOG FLEXPEN U-100 INSULIN) 100 unit/mL (3 mL) injection pen, Inject 0.15 mL (15 Units total) under the skin Three (3) times a day with a meal., Disp: 15 mL, Rfl: 11  ???  insulin detemir U-100 (LEVEMIR) 100 unit/mL (3 mL) injection pen, Inject 0.4 mL (40 Units total) under the skin nightly., Disp: 15 mL, Rfl: 0  ???  insulin syringe-needle U-100 0.3 mL 31 gauge x 5/16 Syrg, Use to inject insulin twice daily with meals, Disp: 60 each, Rfl: 0  ???  isosorbide mononitrate (IMDUR) 30 MG 24 hr tablet, Take 1 tablet (30 mg total) by mouth daily., Disp: 90 tablet, Rfl: 3  ???  lancets 30 gauge Misc, USE TO CHECK BLOOD SUGAR THREE TIMES DAILY WITH MEALS, Disp: 250 each, Rfl: 2  ???  melatonin 3 mg cap, Take 9 mg by mouth nightly., Disp: , Rfl:   ???  nitroglycerin (NITROSTAT) 0.4 MG SL tablet, Place 1 tablet (0.4 mg total) under the tongue every five (5) minutes as needed for chest pain., Disp: 100 each, Rfl: 3  ???  omeprazole (PRILOSEC) 20 MG capsule, Take 1 capsule (20 mg total) by mouth daily., Disp: 90 capsule, Rfl: 3  ???  oxyCODONE-acetaminophen (PERCOCET) 5-325 mg per tablet, Take 1 tablet by mouth every eight (8) hours as needed., Disp: 12 tablet, Rfl: 0  ???  QUEtiapine (SEROQUEL) 50 MG tablet, Take 1 tablet (50 mg total) by mouth two (2) times a day as needed (headache)., Disp: 180 tablet, Rfl: 3  ???  rivaroxaban (XARELTO) 15 mg Tab, Take 1 tablet (15 mg total) by mouth daily with evening meal., Disp: 90 tablet, Rfl: 3  ???  semaglutide 0.25 mg or 0.5 mg(2 mg/1.5 mL) PnIj, Inject 0.25 mg under the skin every seven (7) days. For 4  weeks then increase to 0.5mg  every 7 days, Disp: 7.5 mL, Rfl: 0  ???  sevelamer (RENVELA) 800 mg tablet, TAKE 2 TABLETS (1600MG ) BY MOUTH THREE TIMES DAILY WITH MEALS, Disp: 540 tablet, Rfl: 3  ???  ticagrelor (BRILINTA) 90 mg Tab, TAKE 1 TABLET BY MOUTH TWICE DAILY, Disp: 180 tablet, Rfl: 3  ???  traMADol (ULTRAM) 50 mg tablet, Take 1 tablet (50 mg total) by mouth every six (6) hours as needed., Disp: 30 tablet, Rfl: 0  ???  UNIFINE PENTIPS 31 gauge x 5/16 Ndle, USE WITH INSULIN AND VICTOZA 5 TIMES DAILY, Disp: 400 each, Rfl: 3    Allergies  Losartan and Morphine    Family History   Problem Relation Age of Onset   ??? Diabetes Mother    ??? Hyperlipidemia Mother    ??? Heart disease Mother    ??? Basal cell carcinoma Mother    ??? Blindness Mother         diabeties   ??? Obesity Mother    ??? Hyperlipidemia Father    ??? Heart disease Father    ??? Lung disease Father    ??? Heart disease Half-Sister    ??? Kidney disease Half-Sister    ??? Gallbladder disease Half-Brother    ??? Obesity Half-Brother    ??? No Known Problems Daughter    ??? No Known Problems Son    ??? Obesity Daughter    ??? Melanoma Neg Hx    ??? Squamous cell carcinoma Neg Hx    ??? Macular degeneration Neg Hx    ??? Glaucoma Neg Hx        Social History  Social History     Tobacco Use   ??? Smoking status: Never Smoker   ??? Smokeless tobacco: Current User     Types: Chew   ??? Tobacco comment: Pt dips 1 can a week, Pt not interested in quitting    Substance Use Topics   ??? Alcohol use: Never     Alcohol/week: 0.0 standard drinks     Frequency: Never     Comment: rare use: couple times a year, small amount   ??? Drug use: No         Physical Exam     ED Triage Vitals [06/15/18 1245]   Enc Vitals Group      BP 231/113      Pulse       SpO2 Pulse 101      Resp 18      Temp 36.2 ??C (97.2 ??F)      Temp Source Oral      SpO2 96 %      Weight        Constitutional: Alert and oriented. Well appearing. In no distress.  Eyes: Conjunctivae are normal.  ENT       Head: Normocephalic and atraumatic.       Nose: No congestion.       Mouth/Throat: Mucous membranes are moist.       Neck: No stridor. Supple, full ROM.  Hematological/Lymphatic/Immunilogical: No cervical lymphadenopathy.  Cardiovascular: rate as vital signs, regular rhythm. Normal and symmetric distal pulses are present in all extremities.  Respiratory: Normal respiratory effort, symmetrical expansion of chest. No wheezes, no rales.  Gastrointestinal: Soft and nontender. No rigid abdomen present. Genitourinary: Deferred  Musculoskeletal: 2+ radial pulses bilaterally. 2+ ulnar pulses bilaterally. No tenderness or edema of bilateral LEs.  Neurologic: Normal speech and language. No gross focal neurologic deficits are appreciated.  Skin: Skin is warm, dry and intact. No rash noted.  Psychiatric: Mood and affect are normal. Speech and behavior are normal.    Radiology     PVL Venous Duplex Upper Extremity Right   Final Result      XR Wrist 3 Or More Views Right   Final Result   No acute osseous abnormality.      XR Elbow 2 Views Right   Final Result   No acute fracture or dislocation.      Chronic common flexor tendinosis.      Mild posterior forearm soft tissue swelling [place] reflect contusion.          Pertinent labs & imaging results that were available during my care of the patient were reviewed by me and considered in my medical decision making (see chart for details).     Please note- This chart has been created using [employer]. Chart creation errors have been sought, but Sarris not always be located and such creation errors, especially pronoun confusion, do NOT reflect on the standard of medical care.     Documentation assistance was provided by Pamala Duffel, Scribe, on June 15, 2018 at 12:49 PM for Raynald Blend, MD.    I attest that I have reviewed the note and that the components of the history of the present illness, the physical exam, and the assessment and plan documented were performed by me or were performed in my presence by the student where I verified the documentation and performed (or re-performed) the exam and medical decision making.        Raynald Blend, MD  Resident  06/15/18 2000

## 2018-06-16 DIAGNOSIS — E875 Hyperkalemia: Principal | ICD-10-CM

## 2018-06-16 DIAGNOSIS — N184 Chronic kidney disease, stage 4 (severe): Principal | ICD-10-CM

## 2018-06-16 NOTE — Unmapped (Signed)
Went to ER yesterday and his potassium was high.  He was told he needed to have it rechecked tomorrow.  Can you put in an order for him to have this checked?  His # if ?'s is [phone]

## 2018-06-17 ENCOUNTER — Ambulatory Visit: Admit: 2018-06-17 | Discharge: 2018-06-18 | Payer: MEDICARE

## 2018-06-17 DIAGNOSIS — I5042 Chronic combined systolic (congestive) and diastolic (congestive) heart failure: Principal | ICD-10-CM

## 2018-06-17 DIAGNOSIS — I2510 Atherosclerotic heart disease of native coronary artery without angina pectoris: Principal | ICD-10-CM

## 2018-06-17 DIAGNOSIS — N184 Chronic kidney disease, stage 4 (severe): Principal | ICD-10-CM

## 2018-06-17 DIAGNOSIS — E875 Hyperkalemia: Principal | ICD-10-CM

## 2018-06-17 LAB — BASIC METABOLIC PANEL
ANION GAP: 14 mmol/L (ref 7–15)
ANION GAP: 14 mmol/L — ABNORMAL HIGH (ref 7–15)
BLOOD UREA NITROGEN: 30 mg/dL — ABNORMAL HIGH (ref 7–21)
BUN / CREAT RATIO: 10
CALCIUM: 9.5 mg/dL (ref 8.5–10.2)
CHLORIDE: 105 mmol/L (ref 98–107)
CO2: 20 mmol/L — ABNORMAL LOW (ref 22.0–30.0)
CREATININE: 3.08 mg/dL — ABNORMAL HIGH (ref 0.70–1.30)
EGFR CKD-EPI AA MALE: 26 mL/min/{1.73_m2} — ABNORMAL LOW (ref >=60)
GLUCOSE RANDOM: 305 mg/dL — ABNORMAL HIGH (ref 65–179)
POTASSIUM: 5.7 mmol/L — ABNORMAL HIGH (ref 3.5–5.0)

## 2018-06-17 LAB — LIPID PANEL
HDL CHOLESTEROL: 35 mg/dL — ABNORMAL LOW (ref 40–59)
NON-HDL CHOLESTEROL: 269 mg/dL
TRIGLYCERIDES: 500 mg/dL — ABNORMAL HIGH (ref 1–149)

## 2018-06-17 NOTE — Unmapped (Signed)
Transition of Care Encounter Data    Call attempt:  1  Admission date:  06/14/18  Discharge date:  06/15/18  Discharge diagnosis:  Pain right Forearm, recent peripheral phlebitis  Has a f/u appt. been scheduled within 7-14 days of discharge?:  Yes with Primary PCP clinic  Appt with Primary PCP:  06/29/18  Patient post discharge  Are you able to make it to your f/u appt.?:  Yes  Since your discharge, are your symptoms better, worse, or the same?:  Better  Have you developed any new symptoms?:  No  Is there someone to help you at home?:  Yes   Helper:  Spouse Event organiser)   Are there questions we can help clarify before your next appointment?:  No  Medications       Were you able to pick up all of your newly prescribed medications (and/or any necessary refills)?:  Yes   Were medications prescribed or ordered upon discharge reviewed today with the most recent outpatient medication list?:  No  Patient was reminded to bring all of their medication bottles to their follow up appointment:  Yes  If Home Health Services or Medical Equipment was ordered, has it arrived?:  Yes (Comment: [facility])  Was the patients f/u appt. date/time and location confirmed with the patient?:  Yes  Remind patients: if patient has non-emergent medical problem, they [place] contact [phone] to talk to a nurse (24/7), or patient Zellman call their providers??? clinic. If experiencing a medical emergency, patient should call 911:  Yes     Reviewed discharge planning summary from Emergency Room and patient verbalized understanding.  He has home health and he has his appointments. At present, he is residing with daughter, and states the living arrangements are not working well. He does have a Child psychotherapist from [employer] whom is trying to assist him in locating a place to stay.  He expressed thanks for the call.     Sammuel Cooper RN, BSN, CCM, RN Patient [facility], [facility]  [address], [address]  [place] [address] Phone [phone]  [email or web address]

## 2018-06-17 NOTE — Unmapped (Signed)
CCM Follow-up Call      PCP Action:None     CCM Action:f/u lab    Patient Action:went back home after having labs drawn. will call CCM for any  changes.      Pt continues to c/o r. Arm pain.

## 2018-06-17 NOTE — Unmapped (Signed)
Placed orders for BMP.  At [facility] ED encounter on 3/17 for wrist pain, K 6.0 --> 6.1 --> 5.8. Treated in ED with insulin.  Does not appear that he has been prescribed kayexylate.  Should no longer be on ACEi/ARB, but not clear that this is truly the case at home.      If K remains elevated, would require admission and medical management of hyperkalemia, and for possible dialysis if unable to control hyperkalemia.    I was the supervising physician in the delivery of the service. Kurtis Bushman, MD

## 2018-06-17 NOTE — Unmapped (Signed)
Reviewed lab results.  K improved to 5.7. Repeat in 2-4 days.  Order entered and he will return to [facility] to the lab. If persistently above 6, will admit.

## 2018-06-18 DIAGNOSIS — I5042 Chronic combined systolic (congestive) and diastolic (congestive) heart failure: Principal | ICD-10-CM

## 2018-06-18 DIAGNOSIS — I2510 Atherosclerotic heart disease of native coronary artery without angina pectoris: Principal | ICD-10-CM

## 2018-06-18 NOTE — Unmapped (Signed)
CCM spoke w/ pt's wife [name].  Family having crisis management right now, living with daughter and daughter can not tolerate HH visits.  Pt's wife looking at a a new trailer to move into next week.  CCM convinced pt's wife to not discharge HH but to suspend for one week.  Spoke w/ Inetta Fermo, nurse @ [facility]: ph# [phone]. Pt and family will accept [facility] services (RN, PT, OT, SW)  June 28, 2018.   CCM unable to reach [place] OT @ ph# supplied. Inetta Fermo LPN will reassess pt's situation prior to visit 06/28/2018. Tina aware pt has rec'd supplies: pulse ox, BP cuff and scale. CCM and TIna will keep each other informed prn.

## 2018-06-18 NOTE — Unmapped (Signed)
Sorry Toniann Fail but I have had two calls one from Trilby Leaver who needs orders to either D/C this patient from [facility] or suspend for now because of his home situation.  Her # is [phone].  The other message was from [place] with OT and need orders to D/C at [phone]

## 2018-06-18 NOTE — Unmapped (Signed)
I was the supervising physician in the delivery of the service. Jerrilyn Messinger D Leslee Haueter, MD

## 2018-06-18 NOTE — Unmapped (Signed)
Wife [name] called and says she wants to stop all HH visits because they have no house.  She says they are calling her at all hours and telling her they need a house and she can't take it any more.  Her # is [phone]

## 2018-06-21 NOTE — Unmapped (Signed)
[facility] Refill Coordination Note    Specialty Medication(s) to be Shipped:   General Specialty: Praluent75/ml    Other medication(s) to be shipped:      Shawn Route Brill, DOB: 1970-10-01  Phone: There are no phone numbers on file.      All above HIPAA information was verified with patient's family member.     Completed refill call assessment today to schedule patient's medication shipment from the [facility] [phone]).       Specialty medication(s) and dose(s) confirmed: Regimen is correct and unchanged.   Changes to medications: [name] reports no changes reported at this time.  Changes to insurance: No  Questions for the pharmacist: No    Confirmed patient received Welcome Packet with first shipment. The patient will receive a drug information handout for each medication shipped and additional FDA Medication Guides as required.       DISEASE/MEDICATION-SPECIFIC INFORMATION        N/A    SPECIALTY MEDICATION ADHERENCE      Medication Adherence    Patient reported X missed doses in the last month:  0  Specialty Medication:  Praluent 75mg /ml                Praluent 75 mg/ml: 0 days of medicine on hand       SHIPPING     Shipping address confirmed in Epic.     Delivery Scheduled: Yes, Expected medication delivery date: [id number].     Medication will be delivered via Same Day Courier to the home address in Epic WAM.    Antonietta Barcelona   [facility] Specialty Technician

## 2018-06-22 ENCOUNTER — Ambulatory Visit: Admit: 2018-06-22 | Discharge: 2018-06-23 | Payer: MEDICARE

## 2018-06-22 DIAGNOSIS — I5042 Chronic combined systolic (congestive) and diastolic (congestive) heart failure: Principal | ICD-10-CM

## 2018-06-22 DIAGNOSIS — G4733 Obstructive sleep apnea (adult) (pediatric): Principal | ICD-10-CM

## 2018-06-22 DIAGNOSIS — I5022 Chronic systolic (congestive) heart failure: Principal | ICD-10-CM

## 2018-06-22 DIAGNOSIS — Z6841 Body Mass Index (BMI) 40.0 and over, adult: Principal | ICD-10-CM

## 2018-06-22 DIAGNOSIS — E119 Type 2 diabetes mellitus without complications: Principal | ICD-10-CM

## 2018-06-22 DIAGNOSIS — I10 Essential (primary) hypertension: Principal | ICD-10-CM

## 2018-06-22 DIAGNOSIS — I4892 Unspecified atrial flutter: Principal | ICD-10-CM

## 2018-06-22 DIAGNOSIS — G894 Chronic pain syndrome: Principal | ICD-10-CM

## 2018-06-22 DIAGNOSIS — I2510 Atherosclerotic heart disease of native coronary artery without angina pectoris: Principal | ICD-10-CM

## 2018-06-22 DIAGNOSIS — E1142 Type 2 diabetes mellitus with diabetic polyneuropathy: Principal | ICD-10-CM

## 2018-06-22 DIAGNOSIS — R3129 Other microscopic hematuria: Principal | ICD-10-CM

## 2018-06-22 DIAGNOSIS — N200 Calculus of kidney: Principal | ICD-10-CM

## 2018-06-22 DIAGNOSIS — Z7901 Long term (current) use of anticoagulants: Principal | ICD-10-CM

## 2018-06-22 DIAGNOSIS — G43909 Migraine, unspecified, not intractable, without status migrainosus: Principal | ICD-10-CM

## 2018-06-22 DIAGNOSIS — R569 Unspecified convulsions: Principal | ICD-10-CM

## 2018-06-22 DIAGNOSIS — S6410XA Injury of median nerve at wrist and hand level of unspecified arm, initial encounter: Principal | ICD-10-CM

## 2018-06-22 DIAGNOSIS — I214 Non-ST elevation (NSTEMI) myocardial infarction: Principal | ICD-10-CM

## 2018-06-22 DIAGNOSIS — Z550 Illiteracy and low-level literacy: Principal | ICD-10-CM

## 2018-06-22 DIAGNOSIS — E669 Obesity, unspecified: Principal | ICD-10-CM

## 2018-06-22 DIAGNOSIS — Z590 Homelessness: Principal | ICD-10-CM

## 2018-06-22 DIAGNOSIS — F410 Panic disorder [episodic paroxysmal anxiety] without agoraphobia: Principal | ICD-10-CM

## 2018-06-22 DIAGNOSIS — F329 Major depressive disorder, single episode, unspecified: Principal | ICD-10-CM

## 2018-06-22 DIAGNOSIS — I4891 Unspecified atrial fibrillation: Principal | ICD-10-CM

## 2018-06-22 DIAGNOSIS — E1321 Other specified diabetes mellitus with diabetic nephropathy: Principal | ICD-10-CM

## 2018-06-22 DIAGNOSIS — G459 Transient cerebral ischemic attack, unspecified: Principal | ICD-10-CM

## 2018-06-22 DIAGNOSIS — M109 Gout, unspecified: Principal | ICD-10-CM

## 2018-06-22 DIAGNOSIS — M1990 Unspecified osteoarthritis, unspecified site: Principal | ICD-10-CM

## 2018-06-22 DIAGNOSIS — E785 Hyperlipidemia, unspecified: Principal | ICD-10-CM

## 2018-06-22 DIAGNOSIS — S0590XA Unspecified injury of unspecified eye and orbit, initial encounter: Principal | ICD-10-CM

## 2018-06-22 DIAGNOSIS — K219 Gastro-esophageal reflux disease without esophagitis: Principal | ICD-10-CM

## 2018-06-22 DIAGNOSIS — K760 Fatty (change of) liver, not elsewhere classified: Principal | ICD-10-CM

## 2018-06-22 DIAGNOSIS — J45909 Unspecified asthma, uncomplicated: Principal | ICD-10-CM

## 2018-06-22 DIAGNOSIS — N183 Chronic kidney disease, stage 3 (moderate): Principal | ICD-10-CM

## 2018-06-22 DIAGNOSIS — I208 Other forms of angina pectoris: Principal | ICD-10-CM

## 2018-06-22 MED ORDER — INSULIN DETEMIR (U-100) 100 UNIT/ML (3 ML) SUBCUTANEOUS PEN
Freq: Every evening | SUBCUTANEOUS | 0 refills | 0 days | Status: CP
Start: 2018-06-22 — End: 2018-07-05

## 2018-06-22 MED ORDER — ACETAMINOPHEN 500 MG TABLET
3 refills | 0 days | Status: CP
Start: 2018-06-22 — End: 2018-11-11
  Filled 2018-06-22: qty 180, 30d supply, fill #0

## 2018-06-22 MED ORDER — GABAPENTIN 300 MG CAPSULE: 300 mg | capsule | Freq: Every evening | 0 refills | 0 days | Status: AC

## 2018-06-22 MED ORDER — ARM BRACE
Freq: Every day | 0 refills | 0 days | Status: CP
Start: 2018-06-22 — End: ?

## 2018-06-22 MED ORDER — GABAPENTIN 300 MG CAPSULE
ORAL_CAPSULE | Freq: Two times a day (BID) | ORAL | 0 refills | 0.00000 days | Status: CP
Start: 2018-06-22 — End: 2018-07-08
  Filled 2018-06-22: qty 60, 30d supply, fill #0

## 2018-06-22 MED FILL — GABAPENTIN 300 MG CAPSULE: 30 days supply | Qty: 60 | Fill #0 | Status: AC

## 2018-06-22 MED FILL — FLUOXETINE 20 MG CAPSULE: 30 days supply | Qty: 90 | Fill #1 | Status: AC

## 2018-06-22 MED FILL — ACETAMINOPHEN 500 MG TABLET: 30 days supply | Qty: 180 | Fill #0 | Status: AC

## 2018-06-22 MED FILL — FLUOXETINE 20 MG CAPSULE: ORAL | 30 days supply | Qty: 90 | Fill #1

## 2018-06-22 NOTE — Unmapped (Signed)
I was the supervising physician in the delivery of the service. Cheikh Bramble D Autumm Hattery, MD

## 2018-06-22 NOTE — Unmapped (Signed)
I spoke to patient  Spouse and they are requesting to be seen in face-to-face visit. Patient complains of severe right arm  pain . I informed them that we are only scheduling face-to-face visits for urgent needs at this time to reduce risk of COVID-19 exposure at this time. Please call patient to determine whether they should be scheduled for face-to-face visit.    Best callback day/time: ASAP!  Best callback number: [phone]    Please let me know if there is anything I can do to help.

## 2018-06-22 NOTE — Unmapped (Signed)
2nd incoming call from patient spouse regarding message below.  Staff ensured that they will received a call back. Contact number verified.  Please let me know if I can do anything to help you.  Thank you.

## 2018-06-22 NOTE — Unmapped (Signed)
CCM Follow-up Call    PCP Action:None     CCM Action:follow up pain mngt    Patient Action:SDC visit today     Spoke to pt's wife, [name].  States pt had emesis this morning undigested lasagna from 12 hours ago.  No c/o CP  Taking all medications (CCM requested pt to bring all meds today- wife verbalizes understanding)  R. Wrist-r. Elbow pain 10/10   Took percocet 5/325 this morning, states ineffective.  Pt states can't sleep . R. Lower Arm is more swollen and hand is more swollen and r. Arm is warmer than l. Arm.  Fasting BG 06/21/18 150  Pt scheduled for [facility] today per request of pt and wife.   See ED f/u below      CCM ED Follow Up    Shawn Route Alcott presented to the emergency department on 06/15/18    Call attempt: [x] 1 [] 2 [] 3    Internal/External ED visit: [x] Internal .   Patient reported reason for ED visit: r. Arm pain Superficial thrombus is demonstrated in the right cephalic vein. Deep veins are patent. He was discharged home with prescription for percocet.   Patient's symptoms are better, worse or the same since discharge: [] Better [x] Worse [] Same   What are the patient's current symptoms:  R. Wrist to r. Elbow radiating 10/10 pain , did take percocet po x1 w/ an acetaminophen this morning, states ineffective for pain mngt.   Patient was able to pick up all newly prescribed medications (and/or any necessary refills): [x] Yes [] No,Patient was reminded to bring all medication to their next PCP appointment: yes   Barriers to care: CCM Barriers to Care: health literacy, adherence  Patient has been made aware of Urgent Care/same day walk-in hours: yes  Patient has a follow up appointment scheduled with PCP within 7-14 days: yes, Wife and pt will r/s appts as suggested by Provider.    Patient identifies the following individual as their primary point of contact:  Gwen Her RN CCM

## 2018-06-22 NOTE — Unmapped (Signed)
INTERNAL MEDICINE ADVANCED SAME DAY CLINIC NOTE     06/23/2018    PCP: Kurtis Bushman, MD     Assessment and Plan    Jeremy Oliver was seen today for arm pain.    Diagnoses and all orders for this visit:    R hand pain and numbness  - Hand Surgery referral  - Increase gabapentin to 300 bid  - Wrist brace    Follow up as scheduled or sooner as needed.    Patient was seen and discussed with Dr. Lennie Muckle who is in agreement with the assessment and plan as outlined above.       Subjective    Problem List:  Patient Active Problem List   Diagnosis   ??? CAD (coronary artery disease) (RAF-[name])   ??? Idiopathic chronic gout of left knee   ??? Hypercholesteremia   ??? Essential hypertension (RAF-[name])   ??? 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 Osteoarthritis   ??? Chronic nonalcoholic liver disease   ??? GERD (gastroesophageal reflux disease)   ??? Mild intermittent asthma without complication   ??? Chronic pain syndrome   ??? Paroxysmal atrial fibrillation (CMS-[name])   ??? OSA (obstructive sleep apnea)   ??? Syncope   ??? Tobacco use disorder   ??? Status post cardiac pacemaker procedure   ??? Type 2 diabetes mellitus with stage 4 chronic kidney disease, with long-term current use of insulin (CMS-[name])   ??? Cardiac pacemaker in situ   ??? Recurrent major depressive disorder, in full remission (CMS-[name])   ??? Adjustment disorder, unspecified   ??? Chronic heart failure with reduced ejection fraction and diastolic dysfunction (CMS-[name])   ??? Hyperkalemia   ??? CKD (chronic kidney disease) stage 4, GFR 15-29 ml/min (CMS-[name])   ??? Acquired left foot drop   ??? Decreased sensation of lower extremity, left   ??? Chronic anticoagulation       HPI  Jeremy Oliver is a 48 y.o. with a-fib, CKD, CAD c/b NSTEMI, HFrEF, hypertension, OSA, and TIA who presents to the same day clinic for R hand pain.    Patient reports that he was in his usual state of heath until an [facility] clinic appointment on 3/2 when he suffered a syncopal event accompanied by chest pain and vomiting. When being transferred by EMS, an IV was placed into the dorsum of his right hand. There was trauma associated with IV placement. He was discharged from the hospital on 3/5 and syncopal event was thought secondary to polypharmacy vs pseudoseizure (based on prior evaluation by neurology and psychiatry). Arm pain and numbness was not mentioned on the discharge summary. He presented to the ED in [place] on 3/11 complaining of right wrist pain. Ultrasound showed superficial thrombus in the right cephalic vein and no DVT. He was discharged with percocet. He presented to the [facility] ED again on 3/17 with the same complaint. PVL was negative for clot and xray did not show fracture or dislocation. They recommended admission for hyperkalemia but patient declined.     He continues to complain of severe, 10/10 pain and numbness in his right hand. The numbness starts in his fingertips and radiates proximally. There are no alleviating factors.     Meds and allergies were reviewed in Epic    ROS: 10 point ROS was performed and is otherwise negative other than mentioned in the HPI    Objective  PE:  Vitals:  06/22/18 1338   BP: 190/96   Pulse: 108   Temp: 36.6 ??C   SpO2: 98%     General: chronically ill-appearing obese male sitting in wheelchair in moderate distress  Eyes: EOMI, sclera clear  CV: Tachycardic  Resp: Respirations unlabored on room air  Extremity: Dorsum of right wrist with scar from prior trauma and surrounding edema but no erythema or warmth. ROM of wrist limited by pain. Intact radial pulse and fine-touch sensation over all fingers. Normal capillary refill and digits warm and well perfused.     Procedure: None  See procedure note from this encounter    Metric Tracker:  Did today's visit result in referral to ED or direct admission? No

## 2018-06-22 NOTE — Unmapped (Signed)
Pt coming into [facility] re: increased r. Arm pain

## 2018-06-23 NOTE — Unmapped (Signed)
Spoke with patient to re-establish home remote monitoring.  Patient moved and stated he will re-connect.

## 2018-06-24 DIAGNOSIS — E7800 Pure hypercholesterolemia, unspecified: Principal | ICD-10-CM

## 2018-06-24 DIAGNOSIS — I2510 Atherosclerotic heart disease of native coronary artery without angina pectoris: Principal | ICD-10-CM

## 2018-06-24 MED ORDER — ALIROCUMAB 150 MG/ML SUBCUTANEOUS PEN INJECTOR
INJECTION | SUBCUTANEOUS | 11 refills | 56.00000 days | Status: CP
Start: 2018-06-24 — End: 2018-06-24
  Filled 2018-06-29: qty 4, 56d supply, fill #0

## 2018-06-24 MED ORDER — ALIROCUMAB 150 MG/ML SUBCUTANEOUS PEN INJECTOR: 150 mg | Syringe | 11 refills | 56 days | Status: AC

## 2018-06-24 MED FILL — PRALUENT PEN 75 MG/ML SUBCUTANEOUS PEN INJECTOR: 28 days supply | Qty: 2 | Fill #2 | Status: AC

## 2018-06-24 MED FILL — PRALUENT PEN 75 MG/ML SUBCUTANEOUS PEN INJECTOR: SUBCUTANEOUS | 28 days supply | Qty: 2 | Fill #2

## 2018-06-24 NOTE — Unmapped (Addendum)
[facility]  [address]  [place], [place] [address]  PHONE: [phone]  FAX: [phone]    CLINICAL PHARMACIST PRACTITIONER PHONE NOTE   PHARMACOTHERAPY- Lipid    Referring Provider: Winn Jock, MD  Cardiologist: Jeremy Finlay, MD  Primary Care Provider: Kurtis Bushman, MD    Assessment and Plan:     #Coronary artery disease involving native coronary artery of native heart without angina pectoris:  Patient with significant CAD, s/p multiple stents.  Patient is very high risk ASCVD (recent ACS) and on Jeremy Oliver lipid lowering regimen with atorvastatin 80 mg and ezetimibe 10 mg daily and therefore adding a PCSK9 is indicated per ACC/AHA 2018 Guidelines on Management of high cholesterol.  Started on Praluent 75 mg every 2 weeks in February.  Direct LDL -178 compared to 148 back in December.  TG still high and therefore unable to calculate LDL.    ?? Increase Praluent 150 mg 1 injection every 2 weeks  ?? Continue atorvastatin 80 mg and ezetimibe 10 mg daily   ?? Rx to be sent to [facility] for benefit investigation  ?? LDL to be checked today to provide to insurance    Hypertriglyceridemia - Patient TG have been extremely elevated.  He is being worked up for Choledocholithiasis.  Currently taking OTC fish oil.   ?? If TG do not improve Jeremy Oliver consider Lovaza or Vascepa (unclear if covered under patients plan)      #Medication Adherence/Monitoring:   ?? Follow up labs to be done after 3rd dose of PCSK9   ?? PA approved for Praluent approved through 04/30/19  ?? Approved for [employer] through 04/04/19 $0 co-pay    FOLLOW UP:  Lipid panel at next visit with Dr. Andrey Oliver, f/u via phone 5/11    This was a 15  minute phone encounter    Subjective:   Jeremy Oliver is a 48 y.o. year old male with a history of CAD, ICM, STEMI in 2018 with VT arrest with emergent PCI to mid LAD, Ischemic cardiomyopathy with ICD [facility]), CKD, paroxysmal afib, HTN, HLD, T2DM, and morbid obesity recently was admitted at [facility]. Underwent PCI 03/12/18 with severe mid and distal RCA stenosis s/p drug-eluding stent (DES) with good blood flow (TIMI 3) post stent-placement and uncomplicated recovery/hospitalization post cath, discharged home on 03/14/18    Patient medical management for CAD include Brillinta 90 mg, carvediolol 25 mg, IMDUR 30 mg daily, hydralazine 50 mg TID, he has continued atorvastatin 80 mg, ezetimibe 10 mg and started on Praluent 2/8.  Patient's wife administers the injection in the back of his arm every 2 weeks and denies any problems with administration.  Denies injection site reaction.  Patient currently taking OTC fish oil capsules daily and has been doing so for many years.  He reports he has been having trouble keeping his food in and Jeremy Oliver have gallbalder issues that is causing pancreatitis.     He is on disability and has Medicare plan.  Patient getting Praluent from [facility].    Allergies/Adverse Events:  Losartan and Morphine    Medications:  Reviewed and updated. Medication list includes revisions made during today's encounter.    Current Outpatient Medications   Medication Sig Dispense Refill   ??? acetaminophen (TYLENOL) 500 MG tablet TAKE 2 TABLETS (1 000MG ) BY MOUTH EVERY 8 HOURS AS NEEDED FOR PAIN 180 each 3   ??? albuterol HFA 90 mcg/actuation inhaler Inhale 2 puffs into the  lungs every 6 (six) hours as needed for wheezing or shortness of breath. 18 g 2   ??? alirocumab (PRALUENT) 150 mg/mL subcutaneous injection Inject 1 mL (150 mg total) under the skin every fourteen (14) days. 4 Syringe 11   ??? allopurinoL (ZYLOPRIM) 100 MG tablet Take 0.5 tablets (50 mg total) by mouth every other day. 30 tablet 11   ??? amiodarone (PACERONE) 200 MG tablet TAKE 1 TABLET BY MOUTH ONCE DAILY 90 tablet 3   ??? arm brace Misc 1 each by Miscellaneous route daily. 1 each 0   ??? atorvastatin (LIPITOR) 80 MG tablet Take 1 tablet (80 mg total) by mouth daily. 90 tablet 3   ??? blood sugar diagnostic Strp USE TO CHECK BLOOD SUGAR 4 TIMES DAILY (BEFORE MEALS AND NIGHTLY) 100 each 3   ??? blood-glucose meter kit Use to check blood sugars, #1 meter, #100 strips. Freestyle glucose meter. ICD-9 250.00 1 each 0   ??? blood-glucose sensor (FREESTYLE NAVIGATOR GLUC SENS) Devi Frequency:BID   Dosage:0.0     Instructions:  Note:Dose: 1     ??? bumetanide (BUMEX) 2 MG tablet TAKE 1 TABLET BY MOUTH ONCE DAILY 90 tablet 3   ??? carvediloL (COREG) 25 MG tablet TAKE 1 TABLET BY MOUTH WITH THE MORNING MEAL AND TAKE 2 TABLETS WITH THE EVENING MEAL 270 tablet 3   ??? cholecalciferol, vitamin D3, 5,000 unit capsule Take 1 capsule (5,000 Units total) by mouth daily. 100 capsule 0   ??? diclofenac sodium (VOLTAREN) 1 % gel Apply 2 g topically Three (3) times a day as needed for pain.     ??? empty container Misc Use as directed to dispose of injectable medications 1 each 3   ??? ezetimibe (ZETIA) 10 mg tablet TAKE 1 TABLET BY MOUTH ONCE DAILY 90 each 3   ??? FLUoxetine (PROZAC) 20 MG capsule Take 3 capsules (60 mg total) by mouth nightly. 270 capsule 3   ??? gabapentin (NEURONTIN) 300 MG capsule Take 1 capsule (300 mg total) by mouth two (2) times a day. 60 capsule 0   ??? insulin ASPART (NOVOLOG FLEXPEN U-100 INSULIN) 100 unit/mL (3 mL) injection pen Inject 0.15 mL (15 Units total) under the skin Three (3) times a day with a meal. 15 mL 11   ??? insulin detemir U-100 (LEVEMIR) 100 unit/mL (3 mL) injection pen Inject 0.4 mL (40 Units total) under the skin nightly. 15 mL 0   ??? insulin syringe-needle U-100 0.3 mL 31 gauge x 5/16 Syrg Use to inject insulin twice daily with meals 60 each 0   ??? isosorbide mononitrate (IMDUR) 30 MG 24 hr tablet Take 1 tablet (30 mg total) by mouth daily. 90 tablet 3   ??? lancets 30 gauge Misc USE TO CHECK BLOOD SUGAR THREE TIMES DAILY WITH MEALS 250 each 2   ??? melatonin 3 mg cap Take 9 mg by mouth nightly.     ??? nitroglycerin (NITROSTAT) 0.4 MG SL tablet Place 1 tablet (0.4 mg total) under the tongue every five (5) minutes as needed for chest pain. 100 each 3   ??? omeprazole (PRILOSEC) 20 MG capsule Take 1 capsule (20 mg total) by mouth daily. 90 capsule 3   ??? oxyCODONE-acetaminophen (PERCOCET) 5-325 mg per tablet Take 1 tablet by mouth every eight (8) hours as needed. 12 tablet 0   ??? QUEtiapine (SEROQUEL) 50 MG tablet Take 1 tablet (50 mg total) by mouth two (2) times a day as needed (headache). 180 tablet 3   ???  rivaroxaban (XARELTO) 15 mg Tab Take 1 tablet (15 mg total) by mouth daily with evening meal. 90 tablet 3   ??? semaglutide 0.25 mg or 0.5 mg(2 mg/1.5 mL) PnIj Inject 0.25 mg under the skin every seven (7) days. For 4 weeks then increase to 0.5mg  every 7 days 7.5 mL 0   ??? sevelamer (RENVELA) 800 mg tablet TAKE 2 TABLETS (1600MG ) BY MOUTH THREE TIMES DAILY WITH MEALS 540 tablet 3   ??? ticagrelor (BRILINTA) 90 mg Tab TAKE 1 TABLET BY MOUTH TWICE DAILY 180 tablet 3   ??? traMADol (ULTRAM) 50 mg tablet Take 1 tablet (50 mg total) by mouth every six (6) hours as needed. 30 tablet 0   ??? UNIFINE PENTIPS 31 gauge x 5/16 Ndle USE WITH INSULIN AND VICTOZA 5 TIMES DAILY 400 each 3     No current facility-administered medications for this visit.        Objective:     There were no vitals filed for this visit.    Review of Systems:  Constitutional:  No fever, chills, or unintentional weight loss  Cardiovascular:  No chest pain or pressure, palpitations, shortness of breath, dyspnea on exertion, orthopnea, PND, or LE edema  Pulmonary:  No cough or shortness of breath  GI:  No nausea, vomiting, constipation, diarrhea, abdominal pain, abdominal distention, dyspepsia, early satiety, change in bowel habits  Endocrine:  No polyuria, polyphagia, polydipsia, or blurred vision  Psych:  No depression, anxiety, insomnia       Lab Results   Component Value Date    PRO-BNP 1,680.0 (H) 05/31/2018    PRO-BNP 1,670.0 (H) 05/11/2018    PRO-BNP 551.0 (H) 11/22/2017    PRO-BNP 231 (H) 06/08/2014    Creatinine 3.08 (H) 06/17/2018    Creatinine 2.87 (H) 06/15/2018 Creatinine 1.30 06/09/2014    Creatinine 1.56 (H) 06/08/2014    BUN 30 (H) 06/17/2018    BUN 31 (H) 06/15/2018    BUN 24 (H) 06/09/2014    BUN 28 (H) 06/08/2014    Potassium 5.7 (H) 06/17/2018    Potassium 5.8 (H) 06/15/2018    Potassium, Bld 5.5 (H) 03/25/2017    Potassium, Bld 4.6 03/25/2017    Magnesium 1.8 06/02/2018    Magnesium 1.7 05/23/2018    Magnesium 2.0 06/09/2014    Magnesium 1.7 05/12/2011    AST 12 (L) 06/01/2018    AST 15 (L) 05/31/2018    AST 37 06/08/2014    AST 24 07/21/2012    ALT 10 06/01/2018    ALT 13 05/31/2018    ALT 36 06/08/2014    ALT 30 07/21/2012    TSH 4.262 (H) 06/01/2018    TSH 2.29 06/08/2014    Total Bilirubin 0.2 06/01/2018    Total Bilirubin 0.2 05/31/2018    Total Bilirubin 0.8 06/08/2014    Total Bilirubin 0.6 07/21/2012    INR, POC 1.2 02/12/2018    INR, POC 0.9 02/01/2018    INR 0.99 05/31/2018    INR 1.03 05/11/2018    WBC 8.7 06/15/2018    WBC 7.5 06/09/2014    HGB 11.6 (L) 06/15/2018    Hemoglobin 8.1 (L) 03/25/2017    HCT 36.3 (L) 06/15/2018    HCT 37.9 (L) 06/09/2014    Platelet 380 06/15/2018    Platelet 190 06/09/2014    Triglycerides 500 (H) 06/17/2018    Triglycerides 400 (H) 08/30/2012    HDL 35 (L) 06/17/2018    HDL 35 (L) 08/30/2012    Non-HDL  Cholesterol 269 06/17/2018    LDL Calculated  06/17/2018      Comment:      Unable to calculate LDL when Triglycerides are >400.    LDL Direct 178.0 (H) 06/17/2018    LDL Direct 239 08/30/2012    LDL Cholesterol, Calculated 264 08/30/2012

## 2018-06-25 NOTE — Unmapped (Signed)
[facility] Clinical Assessment & Refill Coordination Note    Jeremy Oliver, DOB: 1970-11-18  Phone: [phone] (home)     All above HIPAA information was verified with patient's family member.     Specialty Medication(s):   General Specialty: Praluent     Current Outpatient Medications   Medication Sig Dispense Refill   ??? acetaminophen (TYLENOL) 500 MG tablet TAKE 2 TABLETS (1 000MG ) BY MOUTH EVERY 8 HOURS AS NEEDED FOR PAIN 180 each 3   ??? albuterol HFA 90 mcg/actuation inhaler Inhale 2 puffs into the lungs every 6 (six) hours as needed for wheezing or shortness of breath. 18 g 2   ??? alirocumab (PRALUENT) 150 mg/mL subcutaneous injection Inject 1 mL (150 mg total) under the skin every fourteen (14) days. 4 Syringe 11   ??? allopurinoL (ZYLOPRIM) 100 MG tablet Take 0.5 tablets (50 mg total) by mouth every other day. 30 tablet 11   ??? amiodarone (PACERONE) 200 MG tablet TAKE 1 TABLET BY MOUTH ONCE DAILY 90 tablet 3   ??? arm brace Misc 1 each by Miscellaneous route daily. 1 each 0   ??? atorvastatin (LIPITOR) 80 MG tablet Take 1 tablet (80 mg total) by mouth daily. 90 tablet 3   ??? blood sugar diagnostic Strp USE TO CHECK BLOOD SUGAR 4 TIMES DAILY (BEFORE MEALS AND NIGHTLY) 100 each 3   ??? blood-glucose meter kit Use to check blood sugars, #1 meter, #100 strips. Freestyle glucose meter. ICD-9 250.00 1 each 0   ??? blood-glucose sensor (FREESTYLE NAVIGATOR GLUC SENS) Devi Frequency:BID   Dosage:0.0     Instructions:  Note:Dose: 1     ??? bumetanide (BUMEX) 2 MG tablet TAKE 1 TABLET BY MOUTH ONCE DAILY 90 tablet 3   ??? carvediloL (COREG) 25 MG tablet TAKE 1 TABLET BY MOUTH WITH THE MORNING MEAL AND TAKE 2 TABLETS WITH THE EVENING MEAL 270 tablet 3   ??? cholecalciferol, vitamin D3, 5,000 unit capsule Take 1 capsule (5,000 Units total) by mouth daily. 100 capsule 0   ??? diclofenac sodium (VOLTAREN) 1 % gel Apply 2 g topically Three (3) times a day as needed for pain.     ??? empty container Misc Use as directed to dispose of injectable medications 1 each 3   ??? ezetimibe (ZETIA) 10 mg tablet TAKE 1 TABLET BY MOUTH ONCE DAILY 90 each 3   ??? FLUoxetine (PROZAC) 20 MG capsule Take 3 capsules (60 mg total) by mouth nightly. 270 capsule 3   ??? gabapentin (NEURONTIN) 300 MG capsule Take 1 capsule (300 mg total) by mouth two (2) times a day. 60 capsule 0   ??? insulin ASPART (NOVOLOG FLEXPEN U-100 INSULIN) 100 unit/mL (3 mL) injection pen Inject 0.15 mL (15 Units total) under the skin Three (3) times a day with a meal. 15 mL 11   ??? insulin detemir U-100 (LEVEMIR) 100 unit/mL (3 mL) injection pen Inject 0.4 mL (40 Units total) under the skin nightly. 15 mL 0   ??? insulin syringe-needle U-100 0.3 mL 31 gauge x 5/16 Syrg Use to inject insulin twice daily with meals 60 each 0   ??? isosorbide mononitrate (IMDUR) 30 MG 24 hr tablet Take 1 tablet (30 mg total) by mouth daily. 90 tablet 3   ??? lancets 30 gauge Misc USE TO CHECK BLOOD SUGAR THREE TIMES DAILY WITH MEALS 250 each 2   ??? melatonin 3 mg cap Take 9 mg by mouth nightly.     ???  nitroglycerin (NITROSTAT) 0.4 MG SL tablet Place 1 tablet (0.4 mg total) under the tongue every five (5) minutes as needed for chest pain. 100 each 3   ??? omeprazole (PRILOSEC) 20 MG capsule Take 1 capsule (20 mg total) by mouth daily. 90 capsule 3   ??? oxyCODONE-acetaminophen (PERCOCET) 5-325 mg per tablet Take 1 tablet by mouth every eight (8) hours as needed. 12 tablet 0   ??? QUEtiapine (SEROQUEL) 50 MG tablet Take 1 tablet (50 mg total) by mouth two (2) times a day as needed (headache). 180 tablet 3   ??? rivaroxaban (XARELTO) 15 mg Tab Take 1 tablet (15 mg total) by mouth daily with evening meal. 90 tablet 3   ??? semaglutide 0.25 mg or 0.5 mg(2 mg/1.5 mL) PnIj Inject 0.25 mg under the skin every seven (7) days. For 4 weeks then increase to 0.5mg  every 7 days 7.5 mL 0   ??? sevelamer (RENVELA) 800 mg tablet TAKE 2 TABLETS (1600MG ) BY MOUTH THREE TIMES DAILY WITH MEALS 540 tablet 3   ??? ticagrelor (BRILINTA) 90 mg Tab TAKE 1 TABLET BY MOUTH TWICE DAILY 180 tablet 3   ??? traMADol (ULTRAM) 50 mg tablet Take 1 tablet (50 mg total) by mouth every six (6) hours as needed. 30 tablet 0   ??? UNIFINE PENTIPS 31 gauge x 5/16 Ndle USE WITH INSULIN AND VICTOZA 5 TIMES DAILY 400 each 3     No current facility-administered medications for this visit.         Changes to medications: Jeremy Oliver reports no changes reported at this time.    Allergies   Allergen Reactions   ??? Losartan      Hyperkalemia (K>6)   ??? Morphine Palpitations     Medication error: was administered 20mL instead of 2mL       Changes to allergies: No    SPECIALTY MEDICATION ADHERENCE     Praluent 75 mg/ml: 14 days of medicine on hand   Praluent 150 mg/ml: 0 days of medicine on hand          Specialty medication(s) dose(s) confirmed: Patient reports changes to the regimen as follows: Praluent has been increased to 150mg  every 14 days - pt will use the 75mg  pens he just received as his first 150mg  dose on 3/28     Are there any concerns with adherence? No    Adherence counseling provided? Not needed    CLINICAL MANAGEMENT AND INTERVENTION      Clinical Benefit Assessment:    Do you feel the medicine is effective or helping your condition? Little response from the 75mg  dose hence the dose increase    Clinical Benefit counseling provided? Not needed    Adverse Effects Assessment:    Are you experiencing any side effects? No    Are you experiencing difficulty administering your medicine? No    Quality of Life Assessment:    How many days over the past month did your hyperlipidemia  keep you from your normal activities? For example, brushing your teeth or getting up in the morning. 0    Have you discussed this with your provider? Not needed    Therapy Appropriateness:    Is therapy appropriate? Yes, therapy is appropriate and should be continued    DISEASE/MEDICATION-SPECIFIC INFORMATION      For patients on injectable medications: Patient currently has 1 doses left.  Next injection is scheduled for 06/26/18.    PATIENT SPECIFIC NEEDS     ? Does the patient  have any physical, cognitive, or cultural barriers? No    ? Is the patient high risk? No     ? Does the patient require a Care Management Plan? No     ? Does the patient require physician intervention or other additional services (i.e. nutrition, smoking cessation, social work)? No      SHIPPING     Specialty Medication(s) to be Shipped:   General Specialty: Praluent    Other medication(s) to be shipped: none     Changes to insurance: No    Delivery Scheduled: Yes, Expected medication delivery date: 06/29/18.     Medication will be delivered via Same Day Courier to the confirmed prescription address in [facility].    The patient will receive a drug information handout for each medication shipped and additional FDA Medication Guides as required.  Verified that patient has previously received a Conservation officer, historic buildings.    Arnold Long   [facility] Specialty Pharmacist

## 2018-06-28 NOTE — Unmapped (Signed)
INTERNAL MEDICINE COUNSELING SESSION NOTE    LENGTH OF SESSION:  Psychotherapy Session 30 minutes    TYPE OF PSYCHOTHERAPY: Behavioral, Supportive    REASON FOR TREATMENT: Problem Solving Treatment and Mind Body Stress Reduction skill training for??impact of health issues,??stress and self care management; link impact of effort to mood.      REVIEW OF FUNCTIONING SINCE LAST VISIT    MOOD (0-10) =    I'm here  Rating deferred    SATISFACTION WITH EFFORT IN CARING FOR MOOD (0-10) =      Rating deferred    PAIN:     2   my hand is still hurting already spoke to Dr. Hulan Fess about this and daily vomiting in the last week.      PHQ-9 SCORE:     Rating deferred    GAD-7 SCORE:     Rating deferred      CURRENT SUICIDAL/HOMICIDAL IDEATION:    Denies SI / HI      PROBLEM LIST:    Patient Active Problem List   Diagnosis   ??? CAD (coronary artery disease) (RAF-[name])   ??? Idiopathic chronic gout of left knee   ??? Hypercholesteremia   ??? Essential hypertension (RAF-[name])   ??? 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 Osteoarthritis   ??? Chronic nonalcoholic liver disease   ??? GERD (gastroesophageal reflux disease)   ??? Mild intermittent asthma without complication   ??? Chronic pain syndrome   ??? Paroxysmal atrial fibrillation (CMS-[name])   ??? OSA (obstructive sleep apnea)   ??? Tobacco use disorder   ??? Status post cardiac pacemaker procedure   ??? Diabetes mellitus with gastroparesis (CMS-[name])   ??? Cardiac pacemaker in situ   ??? Recurrent major depressive disorder, in full remission (CMS-[name])   ??? Adjustment disorder, unspecified   ??? Chronic heart failure with reduced ejection fraction and diastolic dysfunction (CMS-[name])   ??? Hyperkalemia   ??? CKD (chronic kidney disease) stage 4, GFR 15-29 ml/min (CMS-[name])   ??? Acquired left foot drop   ??? Decreased sensation of lower extremity, left   ??? Chronic anticoagulation         MEDICATION COMPLIANCE:     Taking all medications regularly?  yes     Any changes in medication since last visit?  no     Any missed doses? no       Current Outpatient Medications on File Prior to Visit   Medication Sig Dispense Refill   ??? acetaminophen (TYLENOL) 500 MG tablet TAKE 2 TABLETS (1 000MG ) BY MOUTH EVERY 8 HOURS AS NEEDED FOR PAIN 180 each 3   ??? albuterol HFA 90 mcg/actuation inhaler Inhale 2 puffs into the lungs every 6 (six) hours as needed for wheezing or shortness of breath. 18 g 2   ??? alirocumab (PRALUENT) 150 mg/mL subcutaneous injection Inject 1 mL (150 mg total) under the skin every fourteen (14) days. 4 Syringe 11   ??? allopurinoL (ZYLOPRIM) 100 MG tablet Take 0.5 tablets (50 mg total) by mouth every other day. 30 tablet 11   ??? amiodarone (PACERONE) 200 MG tablet TAKE 1 TABLET BY MOUTH ONCE DAILY 90 tablet 3   ??? arm brace Misc 1 each by Miscellaneous route daily. 1 each 0   ??? atorvastatin (LIPITOR) 80 MG tablet Take 1 tablet (80 mg total) by mouth daily. 90 tablet 3   ??? blood sugar diagnostic Strp USE TO  CHECK BLOOD SUGAR 4 TIMES DAILY (BEFORE MEALS AND NIGHTLY) 100 each 3   ??? blood-glucose meter kit Use to check blood sugars, #1 meter, #100 strips. Freestyle glucose meter. ICD-9 250.00 1 each 0   ??? blood-glucose sensor (FREESTYLE NAVIGATOR GLUC SENS) Devi Frequency:BID   Dosage:0.0     Instructions:  Note:Dose: 1     ??? bumetanide (BUMEX) 2 MG tablet TAKE 1 TABLET BY MOUTH ONCE DAILY 90 tablet 3   ??? carvediloL (COREG) 25 MG tablet TAKE 1 TABLET BY MOUTH WITH THE MORNING MEAL AND TAKE 2 TABLETS WITH THE EVENING MEAL 270 tablet 3   ??? cholecalciferol, vitamin D3, 5,000 unit capsule Take 1 capsule (5,000 Units total) by mouth daily. 100 capsule 0   ??? diclofenac sodium (VOLTAREN) 1 % gel Apply 2 g topically Three (3) times a day as needed for pain.     ??? empty container Misc Use as directed to dispose of injectable medications 1 each 3   ??? ezetimibe (ZETIA) 10 mg tablet TAKE 1 TABLET BY MOUTH ONCE DAILY 90 each 3   ??? FLUoxetine (PROZAC) 20 MG capsule Take 3 capsules (60 mg total) by mouth nightly. 270 capsule 3   ??? gabapentin (NEURONTIN) 300 MG capsule Take 1 capsule (300 mg total) by mouth two (2) times a day. 60 capsule 0   ??? insulin ASPART (NOVOLOG FLEXPEN U-100 INSULIN) 100 unit/mL (3 mL) injection pen Inject 0.15 mL (15 Units total) under the skin Three (3) times a day with a meal. 15 mL 11   ??? insulin detemir U-100 (LEVEMIR) 100 unit/mL (3 mL) injection pen Inject 0.4 mL (40 Units total) under the skin nightly. 15 mL 0   ??? insulin syringe-needle U-100 0.3 mL 31 gauge x 5/16 Syrg Use to inject insulin twice daily with meals 60 each 0   ??? isosorbide mononitrate (IMDUR) 30 MG 24 hr tablet Take 1 tablet (30 mg total) by mouth daily. 90 tablet 3   ??? lancets 30 gauge Misc USE TO CHECK BLOOD SUGAR THREE TIMES DAILY WITH MEALS 250 each 2   ??? melatonin 3 mg cap Take 9 mg by mouth nightly.     ??? nitroglycerin (NITROSTAT) 0.4 MG SL tablet Place 1 tablet (0.4 mg total) under the tongue every five (5) minutes as needed for chest pain. 100 each 3   ??? omeprazole (PRILOSEC) 20 MG capsule Take 1 capsule (20 mg total) by mouth daily. 90 capsule 3   ??? oxyCODONE-acetaminophen (PERCOCET) 5-325 mg per tablet Take 1 tablet by mouth every eight (8) hours as needed. 12 tablet 0   ??? [EXPIRED] polyethylene glycol (MIRALAX) 17 gram packet Mix 1 packet in 4 to 8 oz of water, juice, soda, coffee or tea and drink Two (2) times a day for 7 days. 14 packet 0   ??? [EXPIRED] predniSONE (DELTASONE) 10 MG tablet Take 3 tablets (30 mg total) by mouth daily for 4 days. 12 tablet 0   ??? QUEtiapine (SEROQUEL) 50 MG tablet Take 1 tablet (50 mg total) by mouth two (2) times a day as needed (headache). 180 tablet 3   ??? rivaroxaban (XARELTO) 15 mg Tab Take 1 tablet (15 mg total) by mouth daily with evening meal. 90 tablet 3   ??? semaglutide 0.25 mg or 0.5 mg(2 mg/1.5 mL) PnIj Inject 0.25 mg under the skin every seven (7) days. For 4 weeks then increase to 0.5mg  every 7 days 7.5 mL 0   ??? sevelamer (RENVELA) 800 mg  tablet TAKE 2 TABLETS (1600MG ) BY MOUTH THREE TIMES DAILY WITH MEALS 540 tablet 3   ??? ticagrelor (BRILINTA) 90 mg Tab TAKE 1 TABLET BY MOUTH TWICE DAILY 180 tablet 3   ??? traMADol (ULTRAM) 50 mg tablet Take 1 tablet (50 mg total) by mouth every six (6) hours as needed. 30 tablet 0   ??? UNIFINE PENTIPS 31 gauge x 5/16 Ndle USE WITH INSULIN AND VICTOZA 5 TIMES DAILY 400 each 3     No current facility-administered medications on file prior to visit.            SLEEPING:    off and on since nerve damage in my hand about 1.5 mos ago related to impact of an emergency IV.           APPETITE:    I do eat, but sometimes I throw it right back up, last week daily.  He's in touch with Dr. Hulan Fess about this          SUBSTANCE USE:  no I wish        SESSION FOCUS:  Addressed:  1. Adjustment disorder, unspecified type        Discussed efforts since last contact.    Taking extra precautions when he does go out.    They've been staying at their daughter's home              Addressed:  1. Adjustment disorder, unspecified type        Hand pain from emergency IV that went badly. Needs to see a hand specialist.  Daily vomiting in the last week and concern about what it Sligh indicate  Wife, [name] was out of work for 1 month. Landlord didn't want to work with them so they had to leave house they were renting.  Living, for the last month, with daughter and her boyfriend who are both unemployed.   He and his wife are looking into renting a trailer, unsure if just him and his wife or along with daughter and her boyfriend.  Everything I own is in storage, pawned or packed up.  Explored and discussed impact of all of this, corona virus, challenges for him with confinement      Emotionally tired. Focused on what he can do, and feeling that problems are compounding for himself, his family and others.  Discussed impact and also possible resource information that Gear be helpful from me or Care Management. He says there's nothing that he needs right now and will keep this in mind.        Additional:     Medication adherence and barriers to the treatment plan have been addressed. Opportunities to optimize healthy behaviors have been discussed. Patient / caregiver voiced understanding.      Patient is aware there is a copay and that this is a billable phone visit.   Patient is aware of the format of the adapted version of behavioral counseling for this phone visit.  Patient is aware future visits Bud[place] be by video visit when that option becomes available through [facility].  Patient participated in visit from daughter's home  Patient indicated that:      they felt safe in their setting to engage in phone counseling visit.      ASSESSMENT      ENGAGEMENT: Patient presented a willingness to participate in treatment.  PARTICIPATION QUALITY:    Active  MOOD:    I'm here      AFFECT:  REACTIVE  MENTAL STATUS:     alert and oriented.   MODES OF INTERVENTION used in this session:    Clarification, Exploration, Problem Solving, Support or Week review      TREATMENT PLAN    Provide phone counseling during COVID-19 outbreak    Identified treatment goals:????improve self care management, develop / improve stress management skills or healthy sleep hygiene  ??  Patient-stated health goal:????support      Follow up phone visit to be scheduled in 2 weeks on 4/14 at 3pm

## 2018-06-29 ENCOUNTER — Telehealth: Admit: 2018-06-29 | Discharge: 2018-06-30 | Payer: MEDICARE

## 2018-06-29 ENCOUNTER — Ambulatory Visit: Admit: 2018-06-29 | Discharge: 2018-06-30 | Payer: MEDICARE

## 2018-06-29 ENCOUNTER — Institutional Professional Consult (permissible substitution): Admit: 2018-06-29 | Discharge: 2018-06-30 | Payer: MEDICARE | Attending: Clinical | Primary: Clinical

## 2018-06-29 DIAGNOSIS — Z794 Long term (current) use of insulin: Secondary | ICD-10-CM

## 2018-06-29 DIAGNOSIS — E1122 Type 2 diabetes mellitus with diabetic chronic kidney disease: Principal | ICD-10-CM

## 2018-06-29 DIAGNOSIS — E875 Hyperkalemia: Principal | ICD-10-CM

## 2018-06-29 DIAGNOSIS — I10 Essential (primary) hypertension: Principal | ICD-10-CM

## 2018-06-29 DIAGNOSIS — Z09 Encounter for follow-up examination after completed treatment for conditions other than malignant neoplasm: Principal | ICD-10-CM

## 2018-06-29 DIAGNOSIS — F439 Reaction to severe stress, unspecified: Principal | ICD-10-CM

## 2018-06-29 DIAGNOSIS — N184 Chronic kidney disease, stage 4 (severe): Principal | ICD-10-CM

## 2018-06-29 DIAGNOSIS — F4320 Adjustment disorder, unspecified: Principal | ICD-10-CM

## 2018-06-29 MED FILL — PRALUENT PEN 150 MG/ML SUBCUTANEOUS PEN INJECTOR: 56 days supply | Qty: 4 | Fill #0 | Status: AC

## 2018-06-29 NOTE — Unmapped (Signed)
Phone Visit    Pt consented to phone visit  Pt notified that there [place] be a charge for this service  Best callback number confirmed with patient    CC: concerned about gallbladder, vomiting    HPI:  Currently living with [name], his daughter. Working on getting own place, possibly around [place], toward the end of the month. Staying isolated in 1 room.    States he's concerned about gallbladder. Having vomiting 12-13 hours after eating something. No digesting.  Just chewing up and swallowing.       Got equipment for remote monitoring with home health, but has not fully set up.  Able to use BP cuff.      Laying - 180/102   Sitting-  125/83 with lightheadedness    Upset about friend he said passed away due to COVID.      Had recent visit to [facility] ED on 06/15/2018 for right hand pain. No pain in left hand.  Was told had nerve damage of hand.  Was prescribed oxycodone, but has now run out.   Requesting refills.  Has been using TENS unit for hand, but running out of stickers.     PE: deferred because unable to do given telephone encounter    A/P:     1. Essential hypertension (RAF-[name])    2. CKD (chronic kidney disease) stage 4, GFR 15-29 ml/min (CMS-[name])    3. Type 2 diabetes mellitus with stage 4 chronic kidney disease, with long-term current use of insulin (CMS-[name])    4. Hyperkalemia    5. Hospital discharge follow-up        HTN:  Instructed to stand up slowly.  Continue current regimen as would treat based on standing BPs. Query some autonomic instability     CKD, stage IV and hyperkalemia  Remains asymptomatic  Closely following hyperkalemia. This would likely be his indication for HD in the future  - BMP in 2 weeks. Will get labs at [facility]    T2DM;  Unable to tell me BGs on call as his glucometer was not with him and he was trying to stay away from grandson in the house with cold symptoms.  F/u in 2 weeks regarding this.    Diabetes with gastroparesis:  Vomiting of undigested food likely related to this. Explained why not likely related to gall bladder.   - instructed to eat multiple small snacks throughout the day and to eat at least 1 hr before bedtime.   - in the future would obtain gastric emptying stomach    Right hand numbness and pain  Possibly related to carpal tunnel.  Less likely diabetic neuropathy even though he has this since it is unilateral.  Improved with wrist splinting. Has used TENS unit for this as well.      Follow up: 2 weeks with phone visit to discuss lab results and ongoing vomiting symptoms after modification of volume of PO intake      25 min was spent on this medically necessary service. Pt visit by phone in the setting of State of Emergency due to COVID-19 Pandemic    This patient visit was completed through the use of an audio/video or telephone encounter.      This patient encounter is appropriate and reasonable under the circumstances given the patient's particular presentation at this time. The patient has been advised of the potential risks and limitations of this mode of treatment (including, but not limited to, the absence of in-person examination) and  has agreed to be treated in a remote fashion in spite of them. Any and all of the patient's/patient's family's questions on this issue have been answered.     The patient has also been advised to contact this office for worsening conditions or problems, and seek emergency medical treatment and/or call 911 if the patient deems either necessary.

## 2018-06-29 NOTE — Unmapped (Signed)
Spoke with patient and pre-visit protocol performed. Patient states pain right hand pain, 6/10. He is concerned with his gall bladder, and wants to discuss with Dr Hulan Fess. -Arletta Bale, LPN

## 2018-06-30 NOTE — Unmapped (Addendum)
[facility]  [facility] INTERNAL MEDICINE [place]  [address]  [place] [address]  [id number]      Assessment and Plan    Atrial fibrillation  CKD stage 4    Patient consented to call    Time on call 10 min    continue anticoagulation with Xarelto (rivaroxaban)    ASSESSMENT  ?? Current medications were reviewed today. Actual and potential drug-drug interactions identified today: none  ?? Current dose of DOAC is appropriate based on indication/duration of treatment, renal function, liver function, bleed risk, weight/BMI, age, and concomitant medications: no  ?? DOAC trough level recommended today: no  ?? GI Protection warranted based on age, history of GI bleed/Gastritis/GERD, antiplatelet therapy warrants GI Protection: no      PLAN  ?? Lab monitoring ordered today: none needed  ?? Interval for future lab follow-up  SCr - every 3 months for CrCl < 60 ml/min  LFTs - annual   Hgb - annual   ?? Anticoagulant plan: continue Xarelto 15 mg daily with food.  Consider trough level given concomitant ticagrelor therapy which Doolen increase Xarelto concentrations      Patient was verbally given DOAC education which included: name, strength and dose of medication, importance of adherence, signs of unusual bleeding and bruising, signs of thrombosis relevant to their indication, reason for anticoagulation therapy, drug-drug interaction issues with anticoagulants, need to communicate with healthcare providers that patient is on anticoagulant, and need for routine laboratory monitoring.     This note is available to patients with myuncchart.    Follow-up: No follow-ups on file.        @SUBJECTIVE @    Reason for visit:    Jeremy Oliver is a 48 y.o. year old male who was contacted via telephone today for a direct oral anticoagulation enhanced care visit.     CC: Follow-up atrial fibrillation     Current prescribed DOAC: Xarelto (rivaroxaban) 15 mg PO daily    Medication Adherence and Access:  Since last visit, patient denies missing doses of their prescribed DOAC.     Reason(s) for non-adherence: not applicable, does not miss    Patient does not have to do without their DOAC because of cost.     Anticoagulation ROS:  Recent bleeding signs/symptoms: no  Signs/symptoms of thrombosis: no  Complains of postural dizziness, lightheadedness, rapid heart rate, palpitations:  no  Alcohol consumption: no  Hx of Gastric Bypass: no   Planning procedures/surgeries: no  Other concerns: no    Medications:  Medications were reviewed and updated in Epic today.  DISCREPANCIES NOTED DURING MED REVIEW:   1. none    Social History:  Social History     Tobacco Use   Smoking Status Never Smoker   Smokeless Tobacco Current User   ??? Types: Chew   Tobacco Comment    Pt dips 1 can a week, Pt not interested in quitting        @OBJECTIVE @    Past Medical History:    Active Ambulatory Problems     Diagnosis Date Noted   ??? CAD (coronary artery disease) (RAF-[name]) 02/03/2012   ??? Idiopathic chronic gout of left knee 05/18/2009   ??? Hypercholesteremia 05/18/2009   ??? Essential hypertension (RAF-[name]) 05/18/2009   ??? 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 Obesity, Class III, BMI 40-49.9 (morbid obesity) (CMS-[name]) 06/28/2009   ??? Osteoarthritis 02/03/2012   ??? Chronic nonalcoholic liver disease 02/03/2012   ???  GERD (gastroesophageal reflux disease) 07/21/2012   ??? Mild intermittent asthma without complication 04/15/2013   ??? Chronic pain syndrome 04/11/2014   ??? Paroxysmal atrial fibrillation (CMS-[name]) 06/08/2014   ??? OSA (obstructive sleep apnea) 01/08/2015   ??? Tobacco use disorder 05/28/2015   ??? Status post cardiac pacemaker procedure 07/02/2015   ??? Diabetes mellitus with gastroparesis (CMS-[name]) 10/29/2015   ??? Cardiac pacemaker in situ 05/19/2016   ??? Recurrent major depressive disorder, in full remission (CMS-[name]) 04/08/2016   ??? Adjustment disorder, unspecified 08/12/2017   ??? Chronic heart failure with reduced ejection fraction and diastolic dysfunction (CMS-[name]) 05/13/2017   ??? Hyperkalemia 05/13/2017   ??? CKD (chronic kidney disease) stage 4, GFR 15-29 ml/min (CMS-[name]) 06/03/2017   ??? Acquired left foot drop 01/14/2018   ??? Decreased sensation of lower extremity, left 01/17/2018   ??? Chronic anticoagulation 02/17/2018     Resolved Ambulatory Problems     Diagnosis Date Noted   ??? Chest pain 02/03/2012   ??? Mild episode of recurrent major depressive disorder (CMS-[name]) 02/03/2012   ??? Diabetic cataract (CMS-[name]) 10/20/2011   ??? HA (headache) 12/04/2000   ??? Panic disorder 12/04/2000   ??? Personal history of noncompliance with medical treatment, presenting hazards to health 02/03/2012   ??? Polyneuropathy in diabetes (CMS-[name]) 09/04/2010   ??? Presbyopia 10/20/2011   ??? Sleep apnea 02/03/2012   ??? Obesity    ??? Neuropathy 04/11/2014   ??? Precordial pain 08/14/2014   ??? Seizure-like activity (CMS-[name])    ??? Tachy-brady syndrome (CMS-[name])    ??? Chronic kidney disease, stage 3 (CMS-[name]) 01/08/2015   ??? Vasovagal syncope 03/07/2015   ??? Upper respiratory infection 05/26/2015   ??? Syncope 05/26/2015   ??? Atrial flutter (CMS-[name]) 05/26/2015   ??? Cardiac asystole (CMS-[name]) 05/29/2015   ??? Head trauma 07/02/2015   ??? Adverse effect of amiodarone - Headaches and lightheadedness 09/04/2015   ??? Blurry vision, bilateral 09/04/2015   ??? Hypothyroidism due to medication 01/08/2016   ??? Esophagitis 02/28/2016   ??? Edema of right lower extremity 03/26/2016   ??? Hyperglycemia due to DM2 03/26/2016   ??? Homelessness 03/26/2016   ??? Acute kidney injury superimposed on CKD (CMS-[name]) 03/26/2016   ??? Hyponatremia 03/26/2016   ??? Right leg pain 03/28/2016   ??? Muscle strain 11/05/2016   ??? Swelling of left hand 12/09/2016   ??? NSTEMI (non-ST elevated myocardial infarction) (CMS-[name]) 01/13/2017   ??? Abnormal echocardiogram 02/02/2017   ??? Advance directive on file 02/06/2017   ??? Unresponsiveness 05/12/2017   ??? Acute deep vein thrombosis (DVT) of proximal vein of both lower extremities (CMS-[name]) 05/13/2017   ??? Gastroenteritis 11/23/2017   ??? Effusion of left knee joint 11/23/2017   ??? SIRS (systemic inflammatory response syndrome) (CMS-[name]) 11/23/2017   ??? Hemarthrosis involving knee joint 11/25/2017   ??? Rhinovirus 11/25/2017   ??? Enrolled in chronic care management 12/30/2017   ??? Infection of left knee (CMS-[name]) 12/16/2017     Past Medical History:   Diagnosis Date   ??? Angina at rest (CMS-[name])    ??? Arthritis    ??? Asthma    ??? Atrial fibrillation and flutter (CMS-[name]) 06/08/14   ??? BMI 40.0-44.9, adult (CMS-[name]) 10/29/2015   ??? Coronary artery disease    ??? Depression    ??? Diabetes mellitus (CMS-[name])    ??? Eye trauma 1998   ??? Gout    ??? Homeless    ??? Hyperlipidemia    ??? Hypertension    ??? Illiteracy and low-level literacy    ???  Microscopic hematuria    ??? Migraine    ??? Nephrolithiasis    ??? Nonalcoholic fatty liver disease    ??? Obstructive sleep apnea 2009   ??? Panic attacks    ??? Systolic CHF, chronic (CMS-[name])    ??? TIA (transient ischemic attack)        Vitals:  @WTVITALS @  Estimated body mass index is 43.79 kg/m?? as calculated from the following:    Height as of 06/22/18: 188 cm (6' 2).    Weight as of 06/22/18: 154.7 kg (341 lb 0.8 oz).    Relevant dosing considerations:  Lab Results   Component Value Date    CREATININE 3.08 (H) 06/17/2018     Lab Results   Component Value Date    ALT 10 06/01/2018     Lab Results   Component Value Date    HGB 11.6 (L) 06/15/2018    HCT 36.3 (L) 06/15/2018    PLT 380 06/15/2018     Wt Readings from Last 1 Encounters:   06/22/18 (!) 154.7 kg (341 lb 0.8 oz)     BMI Readings from Last 1 Encounters:   06/22/18 43.79 kg/m??     48 y.o.  Calculated Cockroft-Gault CrCl: 63 ml/min  CHADS2VASC Score (for AFib Patients):  3  HASBLED Score: >= 3 (high risk) which corresponds to >= 3.74 bleeding events per 100 patient years    Time 10 min on call

## 2018-06-30 NOTE — Unmapped (Signed)
Please call at home any time this afternoon

## 2018-06-30 NOTE — Unmapped (Signed)
Irving Burton with [facility] called to say Jeremy Oliver had a fall last night while going to the bathroom to get in shower and did not have brace on.  Fell on his R knee (which is his good leg).  He denies any injuries (they did not physically exam him but got a call to let them know); although patient says it does take a couple of days for him to stand properly after a fall.  He has there service on hold but they plan to restart on Friday.

## 2018-07-01 NOTE — Unmapped (Signed)
??   What have you been told your goal for you hgb A1c is? 8.0 Are you taking your diabetes medications as directed? yes Any questions? No.       Patient will monitor glucose: yes  Fasting: wife states FBG have been 250-300. She increaed insulin humalog 20 u tid   Post-prandial (PM): yes 350s  Nighttime - yes 275      NUTRITION POINTS COVERED: Would you like to see a dietitian to discuss more about diabetes diet? No   Identifying carbohydrates? yes  Pt eating smaller quantities of food.   EXERCISE  Current activity and frequency:  Leg lifts in bed.   Patient's favorite physical activity:  Leg exercises , kicks,       Patient's goals:  1.hgb A1c 8 eating very small portions  Any barriers to education or diabetes care?  Health literacy

## 2018-07-02 ENCOUNTER — Ambulatory Visit: Admit: 2018-07-02 | Discharge: 2018-07-03 | Payer: MEDICARE

## 2018-07-02 DIAGNOSIS — R531 Weakness: Principal | ICD-10-CM

## 2018-07-02 LAB — COMPREHENSIVE METABOLIC PANEL
ALBUMIN: 3.5 g/dL (ref 3.5–5.0)
ALKALINE PHOSPHATASE: 104 U/L (ref 38–126)
ALT (SGPT): 9 U/L (ref <50)
AST (SGOT): 11 U/L — ABNORMAL LOW (ref 19–55)
BILIRUBIN TOTAL: 0.3 mg/dL (ref 0.0–1.2)
BLOOD UREA NITROGEN: 30 mg/dL — ABNORMAL HIGH (ref 7–21)
BUN / CREAT RATIO: 8
CALCIUM: 9.6 mg/dL (ref 8.5–10.2)
CHLORIDE: 103 mmol/L (ref 98–107)
CO2: 22 mmol/L (ref 22.0–30.0)
EGFR CKD-EPI AA MALE: 22 mL/min/{1.73_m2} — ABNORMAL LOW (ref >=60)
EGFR CKD-EPI AA MALE: 22 mL/min/1.73m2 — ABNORMAL LOW (ref >=60–145)
EGFR CKD-EPI NON-AA MALE: 19 mL/min/{1.73_m2} — ABNORMAL LOW (ref >=60)
GLUCOSE RANDOM: 154 mg/dL (ref 65–179)
POTASSIUM: 4.6 mmol/L (ref 3.5–5.0)
PROTEIN TOTAL: 6.9 g/dL (ref 6.5–8.3)
SODIUM: 140 mmol/L (ref 135–145)

## 2018-07-02 LAB — CBC W/ AUTO DIFF
BASOPHILS ABSOLUTE COUNT: 0 10*9/L (ref 0.0–0.1)
BASOPHILS RELATIVE PERCENT: 0.3 %
EOSINOPHILS ABSOLUTE COUNT: 0.2 10*9/L (ref 0.0–0.4)
EOSINOPHILS RELATIVE PERCENT: 1.7 %
HEMOGLOBIN: 10.7 g/dL — ABNORMAL LOW (ref 13.5–17.5)
LARGE UNSTAINED CELLS: 2 % (ref 0–4)
LYMPHOCYTES ABSOLUTE COUNT: 1.7 10*9/L (ref 1.5–5.0)
LYMPHOCYTES RELATIVE PERCENT: 12.2 %
MEAN CORPUSCULAR HEMOGLOBIN CONC: 32.2 g/dL (ref 31.0–37.0)
MEAN CORPUSCULAR VOLUME: 82.3 fL (ref 80.0–100.0)
MEAN PLATELET VOLUME: 7.1 fL (ref 7.0–10.0)
MONOCYTES ABSOLUTE COUNT: 0.7 10*9/L (ref 0.2–0.8)
MONOCYTES RELATIVE PERCENT: 5.2 %
NEUTROPHILS ABSOLUTE COUNT: 10.9 10*9/L — ABNORMAL HIGH (ref 2.0–7.5)
NEUTROPHILS RELATIVE PERCENT: 79.1 %
PLATELET COUNT: 345 10*9/L (ref 150–440)
PLATELET COUNT: 345 10*9/L — ABNORMAL LOW (ref 150–440)
RED BLOOD CELL COUNT: 4.02 10*12/L — ABNORMAL LOW (ref 4.50–5.90)
RED CELL DISTRIBUTION WIDTH: 15.1 % — ABNORMAL HIGH (ref 12.0–15.0)

## 2018-07-02 LAB — BLOOD GAS CRITICAL CARE PANEL, VENOUS
BASE EXCESS VENOUS: -2 (ref -2.0–2.0)
GLUCOSE WHOLE BLOOD: 182 mg/dL — ABNORMAL HIGH (ref 70–179)
HCO3 VENOUS: 22 mmol/L (ref 22–27)
HEMOGLOBIN BLOOD GAS: 9.8 g/dL — ABNORMAL LOW (ref 13.50–17.50)
LACTATE BLOOD VENOUS: 1.6 mmol/L (ref 0.5–1.8)
O2 SATURATION VENOUS: 80.7 % (ref 40.0–85.0)
PCO2 VENOUS: 36 mmHg — ABNORMAL LOW (ref 40–60)
PH VENOUS: 7.41 (ref 7.32–7.43)
PO2 VENOUS: 47 mmHg (ref 30–55)
SODIUM WHOLE BLOOD: 134 mmol/L — ABNORMAL LOW (ref 135–145)

## 2018-07-02 LAB — LIPASE: LIPASE: 63 U/L (ref 44–232)

## 2018-07-02 LAB — APTT: APTT: 36 s (ref 25.9–39.5)

## 2018-07-02 LAB — HEMOGLOBIN A1C: HEMOGLOBIN A1C: 9.5 % — ABNORMAL HIGH (ref 4.8–5.6)

## 2018-07-02 NOTE — Unmapped (Signed)
[facility] Emergency Department Provider Note      ED Clinical Impression     Final diagnoses:   Elevated troponin (Primary)   Chest pain, unspecified type   Non-intractable vomiting with nausea, unspecified vomiting type   Light headedness       Initial Impression, Assessment and Plan, and ED Course     48 y.o. male with a PMH of CAD, HTN, NSTEMI 2018 complicated by cardiogenic shock following VT arrest with emergent PCI to mid LAD, ischemic cardiomyopathy with ICD, stage IV CKD, paroxysmal a. fib s/p post defibrillator and atrial pacemaker (on Xarelto), HTN, HLD, T2DM, chronic heart failure with reduced ejection fraction (last EF 45% in March 2020, on Bumex) and morbid obesity who presents to the ED for evaluation of generalized weakness, nausea and vomiting, and chest tightness since approximately 2 hours prior to arrival.     VSS on arrival, pt afebrile.  He is not tachycardic, not hypoxic, is normotensive.  Patient is mildly uncomfortable appearing on exam however in no acute distress, nontoxic.  Patient on cardiac monitor noted to be in normal sinus rhythm with good pulse oxygenation.  Patient with intermittent episodes of slumping to his side and rolling his eyes back and moaning.  Heart is regular rate and rhythm, radial pulses are intact and symmetric bilaterally.  Patient not in respiratory distress, talking in full sentences without difficulty.  No increased work of breathing, good air movement heard throughout all lung fields, no wheezing, no rhonchi, no rales.  I cannot appreciate any signs of consolidation.  Abdomen is soft and nontender throughout with positive bowel sounds.  There is no lower extremity edema.    Per chart review patient does have a history of similar symptoms in the past, most recent admission in March of this year for recurrent syncope with nausea and vomiting as well as chest pain.  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  Patient was advised to take Seroquel 25 mg for possible psychological component for treatment of anxiety, and nausea and vomiting.    EKG ordered in triage with normal sinus rhythm. Normal axis, normal intervals. No ST segment changes, no QTc prolongation. No significant change from prior study.     Differential includes ACS given patient's extensive cardiac history, possibly heart failure exacerbation however patient without signs of volume overload and no shortness of breath, less likely PE -patient not tachycardic, not hypoxic, no shortness of breath, is currently on anticoagulation.  Low concern for pulmonary infection given normal pulmonary exam, patient afebrile without shortness of breath.  Possibly pericarditis however patient without any preceding viral illness or ST segment elevation.  Less likely pneumothorax given no hypoxia and normal pulmonary exam.  Chest discomfort does seem similar to prior episodes of angina.  Less likely aortic dissection, no back pain, patient is normotensive with intact symmetric radial pulses. Patient does have a chronic history of nausea and vomiting similar to this, seems less likely intra-abdominal process given no abdominal pain, no abdominal tenderness palpation.  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  Symptoms could also be secondary to hypoglycemia as well as volume depletion.  Less likely intracranial abnormality.    Plan to obtain basic labs, troponin x 2, proBNP, coags, and lipase given nausea and vomiting.  He has been placed on a cardiac monitor  and continuous pulse oxygenation.  We will also obtain point-of-care blood glucose. Chest x-ray has been ordered.  Also orthostatic vital signs given light headedness. Will possibly contact cardiology fellow and interrogate ICD/pacer.  I anticipate likely admission given patient's extensive cardiac history.  Heart score is 5 without troponin.    3:48 PM  BG is 142.     4:11 PM  Patient reassessed, appears much more comfortable on exam, is not complaining of any current chest pain, no current nausea.    Orthostatic VS:  Lying: BP 104/62, HR 91  Sitting: BP 99/70, HR 93    Pt with some orthostatic changes, will give 500 mL bolus IVF NS.    4:54 PM  CMP with BUN of 30, creatinine of 3.53, GFR of 19.  This is similar to prior studies although creatinine is slightly more elevated than prior.  CBC with mild leukocytosis of 13.8, no left shift.  This could be reactive to nausea vomiting.  Seems less likely infectious process.  Lipase and coags are within normal limits.  Initial troponin is 0.360. Heart score is 7. Could be 2/2 ACS or demand ischemia. [facility] has been contacted for admission.     CXR normal.    5:00 PM  Spoke with [facility], will contact [facility] for transfer to main for cardiology admit given it is the weekend.     5:12 PM  Spoke with cardiology fellow Heriberto Antigua who is familiar with patient and given elevated troponin recommends transfer and admission to the cardiology service at [facility] main hospital.  Also recommends we give ASA here and repeat troponin.  Recommends if elevated troponin or increase chest pain to give heparin.  Patient reassessed and is currently sleeping, easily arousable and denies any chest pain at this time.  Have talked with the [facility] and the [facility] for air care transport.  Patient and wife at bedside are aware and agreeable.    5:35 PM  Heard from [facility], Med C provider Dr. Herbert Deaner has accepted the pt for admission. Pt continues to remain stable.     6:17 PM  Repeat troponin is 0.350. Pt awaiting [facility] transport, continues to remain stable.     7:06 PM  Waiting on [facility] transport, pt requesting tylenol for his hand pain. Pt reassessed, is pain free at this time, no complaints. Will order diet. Care of pt signed out to [facility] at this time.    History Chief Complaint  Chest Tightness      HPI   Patient was seen by me at 3:28 PM.    Patient is a 48 y.o. male with a PMH of CAD, HTN, NSTEMI 2018 complicated by cardiogenic shock following VT arrest with emergent PCI to mid LAD, ischemic cardiomyopathy with ICD, stage IV CKD, paroxysmal A. Fib is post defibrillator and atrial pacemaker (on Xarelto), HTN, HLD, T2DM, chronic heart failure with reduced ejection fraction (last EF 45% in March 2020) and morbid obesity who presents to the ED for evaluation of generalized weakness, nausea and vomiting, and chest tightness since approximately 2 hours prior to arrival.  Patient states that approximately 2 hours ago he began to feel generalized weakness and fatigue.  States he began to have nausea and had one episode of vomiting.  Also notes a feeling of lightheadedness while seated, denies any syncope.  States he does have these symptoms frequently.  Denies any bright red blood or coffee-ground emesis.  States he does have daily episodes of nausea and vomiting which  are similar to this.  Denies any abdominal pain.  Denies any bowel movement changes or urinary symptoms.  Wife at bedside states that the patient typically vomits daily after eating, patient did have eggs for breakfast this morning.  Patient states that while he was in his car waiting for his wife he began to have a centralized sensation of chest tightness that radiated to his right arm.  Denies any diaphoresis, no pain radiating to his jaw.  Denies any shortness of breath.  States that this chest tightness is very similar to chest tightness he has had in the past assistant with known angina..  Did not take any nitroglycerin for this.  He states that he is currently compliant with all of his medications.  His wife at bedside states that his blood glucose this morning was 250 therefore she gave him 20 units of NovoLog.  States that he typically gets 15 units of NovoLog with each meal but she gave him extra this morning because of his high blood glucose.  He does take Levemir nightly, 40 units.  Patient does have a very complex medical history with extensive evaluations in the past.  He did have a recent admission for syncope, his pacer/ICD was interrogated and per chart review patient has been previously evaluated by both neurology and cardiology for these episodes, it was thought that patient was having pseudoseizures and not true syncope.  Wife at bedside states that he does have frequent episodes of slumping over and rolling his eyes back with decreased responsiveness.  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  Last admission he was encouraged to take 25 mg of Seroquel for underlying psychological component to possibly help nausea vomiting and acute anxiety.  Of note the patient's wife contacted his primary provider in [facility] internal medicine today who advised that he present to the ED.  Patient denies any fevers or chills, denies any cough or other upper respiratory tract infection symptoms.  Patient denies any other symptoms at this time.    Previous chart, nursing notes, and vital signs reviewed.      Pertinent labs & imaging results that were available during my care of the patient were reviewed by me and considered in my medical decision making (see chart for details).    Past Medical History:   Diagnosis Date   ??? Angina at rest (CMS-[name])    ??? Arthritis    ??? Asthma    ??? Atrial fibrillation and flutter (CMS-[name]) 06/08/14   ??? BMI 40.0-44.9, adult (CMS-[name]) 10/29/2015   ??? Chronic anticoagulation 02/17/2018   ??? Chronic kidney disease, stage 3 (CMS-[name])    ??? Chronic pain syndrome 04/11/2014    ~Use daily lyrica as recommended ~Tramadol as needed for pain ~Fluoxetine daily as recommended ~Daily exercise ~Eat a healthy diet ~Good control of your blood sugars can help    ??? Coronary artery disease    ??? Depression    ??? Diabetes mellitus (CMS-[name])    ??? Eye trauma 1998    glass in both eyes and removed   ??? GERD (gastroesophageal reflux disease)    ??? Gout    ??? Homeless    ??? Hyperlipidemia    ??? Hypertension    ??? Illiteracy and low-level literacy    ??? Microscopic hematuria    ??? Migraine    ??? Nephrolithiasis    ??? 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 Nonalcoholic fatty liver disease    ??? NSTEMI (non-ST elevated myocardial infarction) (CMS-[name]) 01/13/2017   ??? Obesity    ??? Obstructive sleep apnea 2009   ??? Panic attacks    ??? 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 Seizure-like activity (CMS-[name])     ~Monitor for symptoms and report them to your primary care provider if they occur    ??? Systolic CHF, chronic (CMS-[name])     EF 40-45% 2017   ??? TIA (transient ischemic attack)        Past Surgical History:   Procedure Laterality Date   ??? CARDIAC CATHETERIZATION  03/08/2007   ??? CYSTOURETHROSCOPY  12/31/2009     Microscopic hematuria   ??? KNEE SURGERY Right 09/23/2006    arthroscopic knee surgery medial and lateral meniscus tears as well as crystal disease   ??? PR CATH PLACE/CORON ANGIO, IMG SUPER/INTERP,R&L HRT CATH, L HRT VENTRIC N/A 02/13/2017    Procedure: Left/Right Heart Catheterization;  Surgeon: Marlaine Hind, MD;  Location: [facility] CATH;  Service: Cardiology   ??? PR CATH PLACE/CORON ANGIO, IMG SUPER/INTERP,W LEFT HEART VENTRICULOGRAPHY N/A 03/17/2017    Procedure: Left Heart Catheterization W Intervention;  Surgeon: Marlaine Hind, MD;  Location: [facility] CATH;  Service: Cardiology   ??? PR CATH PLACE/CORON ANGIO, IMG SUPER/INTERP,W LEFT HEART VENTRICULOGRAPHY N/A 05/12/2018    Procedure: CATH LEFT HEART CATHETERIZATION W INTERVENTION;  Surgeon: Dorathy Kinsman, MD;  Location: [facility] CATH;  Service: Cardiology   ??? PR ECMO/ECLS INITIATION VENO-ARTERIAL N/A 03/19/2017    Procedure: ECMO/ECLS; INITIATION, VENO-ARTERIAL;  Surgeon: Lennie Odor, MD;  Location: MAIN OR [facility];  Service: Cardiac Surgery   ??? PR ECMO/ECLS RMVL PRPH CANNULA OPEN 6 YRS & OLDER Left 03/25/2017    Procedure: ECMO / ECLS PROVIDED BY PHYSICIAN; REMOVAL OF PERIPHERAL (ARTERIAL AND/OR VENOUS) CANNULA(E), OPEN, 6 YEARS AND OLDER;  Surgeon: Alonna Buckler Ikonomidis, MD;  Location: MAIN OR [facility];  Service: Cardiac Surgery   ??? PR EPHYS EVAL W/ ABLATION SUPRAVENT ARRHYTHMIA N/A 07/19/2015    Catheter ablation of cavotricuspid isthmus for atrial flutter [facility])   ??? PR INSER HART PACER XVENOUS ATRIAL N/A 05/30/2015    [employer] dual-chamber pacemaker implant (E.Chung)   ??? PR INSERT/PLACE FLOW DIRECT CATH N/A 03/17/2017    Procedure: Insert Leave In [place];  Surgeon: Marlaine Hind, MD;  Location: [facility] CATH;  Service: Cardiology   ??? PR INSJ/RPLCMT PERM DFB W/TRNSVNS LDS 1/DUAL CHMBR N/A 05/04/2017    Procedure: ICD Implant System (Single/Dual);  Surgeon: Eldred Manges, MD;  Location: [facility] CATH;  Service: Cardiology   ??? PR NEGATIVE PRESSURE WOUND THERAPY DME >50 SQ CM Left 03/25/2017    Procedure: Neg Press Wound Tx (Vac Assist) Incl Topicals, Per Session, Tsa Greater Than/= 50 Cm Squared;  Surgeon: Alonna Buckler Ikonomidis, MD;  Location: MAIN OR [facility];  Service: Cardiac Surgery   ??? PR PRQ TRLUML CORONARY STENT W/ANGIO ONE ART/BRNCH N/A 03/12/2018    Procedure: Percutaneous Coronary Intervention;  Surgeon: Marlaine Hind, MD;  Location: [facility] CATH;  Service: Cardiology   ??? PR REBL VES DIRECT,LOW EXTREM Left 03/19/2017    Procedure: Repr Bld Vessel  Direct; Lower Extrem;  Surgeon: Boykin Reaper, MD;  Location: MAIN OR [facility];  Service: Vascular   ??? PR REMV ART CLOT ILIAC-POP,LEG INCIS Left 03/25/2017    Procedure: EMBOLECTOMY OR THROMBECTOMY, WITH OR WITHOUT CATHETER; FEMOROPOPLITEAL, AORTOILIAC ARTERY, BY LEG INCISION;  Surgeon: Alonna Buckler Ikonomidis, MD;  Location: MAIN OR [facility];  Service: Cardiac Surgery   ??? PR UPPER GI ENDOSCOPY,BIOPSY N/A 02/28/2016    Procedure: UGI ENDOSCOPY; WITH BIOPSY, SINGLE OR MULTIPLE;  Surgeon: Liane Comber, MD;  Location: [facility];  Service: Gastroenterology   ??? PR UPPER GI ENDOSCOPY,DIAGNOSIS N/A 05/07/2016    Procedure: UGI ENDO, INCLUDE ESOPHAGUS, STOMACH, & DUODENUM &/OR JEJUNUM; DX W/WO COLLECTION SPECIMN, BY BRUSH OR WASH;  Surgeon: Modena Nunnery, MD;  Location: [facility];  Service: Gastroenterology         Current Facility-Administered Medications:   ???  sodium chloride 0.9% (NS BOLUS) bolus 500 mL, 500 mL, Intravenous, Once, [employer], PA, Last Rate: 500 mL/hr at 07/02/18 1626, 500 mL at 07/02/18 1626    Current Outpatient Medications:   ???  acetaminophen (TYLENOL) 500 MG tablet, TAKE 2 TABLETS (1 000MG ) BY MOUTH EVERY 8 HOURS AS NEEDED FOR PAIN, Disp: 180 each, Rfl: 3  ???  albuterol HFA 90 mcg/actuation inhaler, Inhale 2 puffs into the lungs every 6 (six) hours as needed for wheezing or shortness of breath., Disp: 18 g, Rfl: 2  ???  alirocumab (PRALUENT) 150 mg/mL subcutaneous injection, Inject 1 mL (150 mg total) under the skin every fourteen (14) days., Disp: 4 Syringe, Rfl: 11  ???  allopurinoL (ZYLOPRIM) 100 MG tablet, Take 0.5 tablets (50 mg total) by mouth every other day., Disp: 30 tablet, Rfl: 11  ???  amiodarone (PACERONE) 200 MG tablet, TAKE 1 TABLET BY MOUTH ONCE DAILY, Disp: 90 tablet, Rfl: 3  ???  arm brace Misc, 1 each by Miscellaneous route daily., Disp: 1 each, Rfl: 0  ???  atorvastatin (LIPITOR) 80 MG tablet, Take 1 tablet (80 mg total) by mouth daily., Disp: 90 tablet, Rfl: 3  ???  blood sugar diagnostic Strp, USE TO CHECK BLOOD SUGAR 4 TIMES DAILY (BEFORE MEALS AND NIGHTLY), Disp: 100 each, Rfl: 3  ???  blood-glucose meter kit, Use to check blood sugars, #1 meter, #100 strips. Freestyle glucose meter. ICD-9 250.00, Disp: 1 each, Rfl: 0  ???  blood-glucose sensor (FREESTYLE NAVIGATOR GLUC SENS) Devi, Frequency:BID   Dosage:0.0     Instructions:  Note:Dose: 1, Disp: , Rfl:   ???  bumetanide (BUMEX) 2 MG tablet, TAKE 1 TABLET BY MOUTH ONCE DAILY, Disp: 90 tablet, Rfl: 3  ???  carvediloL (COREG) 25 MG tablet, TAKE 1 TABLET BY MOUTH WITH THE MORNING MEAL AND TAKE 2 TABLETS WITH THE EVENING MEAL, Disp: 270 tablet, Rfl: 3  ???  cholecalciferol, vitamin D3, 5,000 unit capsule, Take 1 capsule (5,000 Units total) by mouth daily., Disp: 100 capsule, Rfl: 0  ???  diclofenac sodium (VOLTAREN) 1 % gel, Apply 2 g topically Three (3) times a day as needed for pain., Disp: , Rfl:   ???  empty container Misc, Use as directed to dispose of injectable medications, Disp: 1 each, Rfl: 3  ???  ezetimibe (ZETIA) 10 mg tablet, TAKE 1 TABLET BY MOUTH ONCE DAILY, Disp: 90 each, Rfl: 3  ???  FLUoxetine (PROZAC) 20 MG capsule, Take 3 capsules (60 mg total) by mouth nightly., Disp: 270 capsule, Rfl: 3  ???  gabapentin (NEURONTIN) 300 MG capsule,  Take 1 capsule (300 mg total) by mouth two (2) times a day., Disp: 60 capsule, Rfl: 0  ???  insulin ASPART (NOVOLOG FLEXPEN U-100 INSULIN) 100 unit/mL (3 mL) injection pen, Inject 0.15 mL (15 Units total) under the skin Three (3) times a day with a meal., Disp: 15 mL, Rfl: 11  ???  insulin detemir U-100 (LEVEMIR) 100 unit/mL (3 mL) injection pen, Inject 0.4 mL (40 Units total) under the skin nightly., Disp: 15 mL, Rfl: 0  ???  insulin syringe-needle U-100 0.3 mL 31 gauge x 5/16 Syrg, Use to inject insulin twice daily with meals, Disp: 60 each, Rfl: 0  ???  isosorbide mononitrate (IMDUR) 30 MG 24 hr tablet, Take 1 tablet (30 mg total) by mouth daily., Disp: 90 tablet, Rfl: 3  ???  lancets 30 gauge Misc, USE TO CHECK BLOOD SUGAR THREE TIMES DAILY WITH MEALS, Disp: 250 each, Rfl: 2  ???  melatonin 3 mg cap, Take 9 mg by mouth nightly., Disp: , Rfl:   ???  nitroglycerin (NITROSTAT) 0.4 MG SL tablet, Place 1 tablet (0.4 mg total) under the tongue every five (5) minutes as needed for chest pain., Disp: 100 each, Rfl: 3  ???  omeprazole (PRILOSEC) 20 MG capsule, Take 1 capsule (20 mg total) by mouth daily., Disp: 90 capsule, Rfl: 3  ???  oxyCODONE-acetaminophen (PERCOCET) 5-325 mg per tablet, Take 1 tablet by mouth every eight (8) hours as needed., Disp: 12 tablet, Rfl: 0  ???  QUEtiapine (SEROQUEL) 50 MG tablet, Take 1 tablet (50 mg total) by mouth two (2) times a day as needed (headache)., Disp: 180 tablet, Rfl: 3  ???  rivaroxaban (XARELTO) 15 mg Tab, Take 1 tablet (15 mg total) by mouth daily with evening meal., Disp: 90 tablet, Rfl: 3  ???  semaglutide 0.25 mg or 0.5 mg(2 mg/1.5 mL) PnIj, Inject 0.25 mg under the skin every seven (7) days. For 4 weeks then increase to 0.5mg  every 7 days, Disp: 7.5 mL, Rfl: 0  ???  sevelamer (RENVELA) 800 mg tablet, TAKE 2 TABLETS (1600MG ) BY MOUTH THREE TIMES DAILY WITH MEALS, Disp: 540 tablet, Rfl: 3  ???  ticagrelor (BRILINTA) 90 mg Tab, TAKE 1 TABLET BY MOUTH TWICE DAILY, Disp: 180 tablet, Rfl: 3  ???  traMADol (ULTRAM) 50 mg tablet, Take 1 tablet (50 mg total) by mouth every six (6) hours as needed., Disp: 30 tablet, Rfl: 0  ???  UNIFINE PENTIPS 31 gauge x 5/16 Ndle, USE WITH INSULIN AND VICTOZA 5 TIMES DAILY, Disp: 400 each, Rfl: 3    Allergies  Losartan and Morphine    Family History   Problem Relation Age of Onset   ??? Diabetes Mother    ??? Hyperlipidemia Mother    ??? Heart disease Mother    ??? Basal cell carcinoma Mother    ??? Blindness Mother         diabeties   ??? Obesity Mother    ??? Hyperlipidemia Father    ??? Heart disease Father    ??? Lung disease Father    ??? Heart disease Half-Sister    ??? Kidney disease Half-Sister    ??? Gallbladder disease Half-Brother    ??? Obesity Half-Brother    ??? No Known Problems Daughter    ??? No Known Problems Son    ??? Obesity Daughter    ??? Melanoma Neg Hx    ??? Squamous cell carcinoma Neg Hx    ??? Macular degeneration Neg Hx    ???  Glaucoma Neg Hx        Social History  Social History     Tobacco Use   ??? Smoking status: Never Smoker   ??? Smokeless tobacco: Current User     Types: Chew   ??? Tobacco comment: Pt dips 1 can a week, Pt not interested in quitting    Substance Use Topics   ??? Alcohol use: Never     Alcohol/week: 0.0 standard drinks     Frequency: Never     Comment: rare use: couple times a year, small amount   ??? Drug use: No       Review of Systems    Review of Systems   Constitutional: Negative for chills and fever.   HENT: Negative for congestion, rhinorrhea, sneezing and sore throat.    Respiratory: Positive for chest tightness. Negative for cough and shortness of breath.    Cardiovascular: Positive for chest pain. Negative for palpitations and leg swelling.   Gastrointestinal: Positive for nausea and vomiting. Negative for abdominal pain, blood in stool, constipation and diarrhea.   Genitourinary: Negative for decreased urine volume, difficulty urinating, dysuria, frequency and urgency.   Neurological: Positive for light-headedness. Negative for dizziness, syncope and headaches.   All other systems reviewed and are negative.        Labs     Labs Reviewed   COMPREHENSIVE METABOLIC PANEL - Abnormal; Notable for the following components:       Result Value    BUN 30 (*)     Creatinine 3.53 (*)     EGFR CKD-EPI Non-African American, Male 83 (*)     EGFR CKD-EPI African American, Male 94 (*)     AST 11 (*)     All other components within normal limits   TROPONIN I - Abnormal; Notable for the following components:    Troponin I 0.360 (*)     All other components within normal limits   CBC W/ AUTO DIFF - Abnormal; Notable for the following components:    WBC 13.8 (*)     RBC 4.02 (*)     HGB 10.7 (*)     HCT 33.1 (*)     RDW 15.1 (*)     Absolute Neutrophils 10.9 (*)     All other components within normal limits    Narrative:     Please use the Absolute Differential for reference ranges.    LIPASE - Normal   POCT GLUCOSE, INTERFACED - Normal   CBC W/ DIFFERENTIAL    Narrative:     The following orders were created for panel order CBC w/ Differential.  Procedure                               Abnormality         Status                     ---------                               -----------         ------                     CBC w/ Differential[[id number]]         Abnormal            Final result  Please view results for these tests on the individual orders.   PROTIME-INR   APTT   PRO-BNP   TROPONIN I       Radiology     Xr Chest 2 Views    Result Date: 07/02/2018  EXAM: XR CHEST 2 VIEWS DATE: 07/02/2018 3:41 PM ACCESSION: [id number] UN DICTATED: 07/02/2018 3:44 PM INTERPRETATION LOCATION: Main Campus CLINICAL INDICATION: 48 years old Male with CHEST PAIN  COMPARISON: 05/31/2018 TECHNIQUE: PA and Lateral Chest Radiographs. FINDINGS: Right chest wall pacer/AICD and leads are unchanged. The lungs are slightly hypoinflated. No focal airspace consolidation is seen. Stable cardiomediastinal silhouette. Multilevel old left posterior rib fractures are noted. Radiographically clear lungs. No pleural effusion or pneumothorax. Unremarkable cardiomediastinal silhouette.     No focal consolidation.      Physical Exam     VITAL SIGNS:    Vitals:    07/02/18 1508 07/02/18 1552 07/02/18 1553   BP: 140/97 104/62 99/70   Pulse: 96 91 93   Resp: 20 22 20    Temp: 36.9 ??C (98.5 ??F)     TempSrc: Oral     SpO2: 98%     Weight: (!) 151.5 kg (334 lb)     Height: 188 cm (6' 2)         Physical Exam  Vitals signs reviewed.   Constitutional:       General: He is not in acute distress.     Appearance: He is well-developed. He is not ill-appearing, toxic-appearing or diaphoretic.      Comments:  Patient is mildly uncomfortable appearing on exam however in no acute distress, nontoxic.  Patient with intermittent episodes of slumping to his side and rolling his eyes back and moaning.    HENT:      Head: Normocephalic and atraumatic.   Neck:      Musculoskeletal: Normal range of motion.   Cardiovascular:      Rate and Rhythm: Normal rate and regular rhythm.      Pulses: Normal pulses.      Heart sounds: Normal heart sounds. No murmur. No friction rub. No gallop.       Comments: Heart is regular rate and rhythm, radial pulses are intact and symmetric bilaterally.  Pulmonary:      Effort: Pulmonary effort is normal. No respiratory distress.      Breath sounds: Normal breath sounds. No stridor. No wheezing, rhonchi or rales.      Comments: Patient not in respiratory distress, talking in full sentences without difficulty.  No increased work of breathing, good air movement heard throughout all lung fields, no wheezing, no rhonchi, no rales.  I cannot appreciate any signs of consolidation.  Chest:      Chest wall: No tenderness.   Abdominal:      General: Bowel sounds are normal. There is no distension.      Palpations: Abdomen is soft. There is no mass.      Tenderness: There is no abdominal tenderness. There is no right CVA tenderness, left CVA tenderness, guarding or rebound.      Hernia: No hernia is present.   Musculoskeletal: Normal range of motion.         General: No swelling or tenderness.      Right lower leg: No edema.      Left lower leg: No edema.      Comments: There is no lower extremity edema.   Skin:     General: Skin is warm and dry.  Neurological:      Mental Status: He is alert and oriented to person, place, and time.   Psychiatric:         Behavior: Behavior normal.          EKG     EKG ordered in triage with normal sinus rhythm. Normal axis, normal intervals. No ST segment changes, no QTc prolongation. No significant change from prior study.            Toni [place]thurs [place], [place]  07/04/18 2037

## 2018-07-02 NOTE — Unmapped (Signed)
[name] and pt call CCM.  Pt states, I'm just not feeling well, it's like my whole body trying to   [place],  He's been throwing up since yesterday morning,  -And all he wants to do is sleep sleep sleep  CCM offers [facility] visit.    Then [name] states,  He's saying it's chest pain now, grabbin' his chest, taking him to the ER!     V/s this morning 163/87, 87   pa02 97% on RA    CCM notified PCP

## 2018-07-02 NOTE — Unmapped (Signed)
Care Management  Initial Transition Planning Assessment    Care Management  Initial Transition Planning Assessment  ??  Patient has been identified as a Runner, broadcasting/film/video. Longitudinal Plan of Care reviewed, the following information is noted for continuity of care:  After being evicted from his home on 05/25/2018, pt and his wife have been living with their daughter, daughter's fiance, and 50 yr old son. Pt is still open with [facility] for RN/PT/OT/SW. Pt has a RW, Wheelchair, Paediatric nurse, cane. CPAP has been left at the storage or evicted house. Pt and wife are looking at a place in [place] they can rent for 700$/month and their dog is allowed. Pt and wife reports that their daughter is also being evicted and has to leave end of the month. They already picked a place in [place] and are just waiting for the stimulus money or the tax refund to pay for the place. Pt's wife reports that the [facility] from [facility] has an attitude. Informed pt and wife that they can request for another [facility].  ??  Home Safety Screening  Patient Questions:                    1.  Before the illness or injury that brought you to the ED, did you need someone to help you on a regular basis? Do you need people to help you with activities of daily living?  ? No  2.  In the last 24 hours, have you needed more help than usual?  ? No  3.  Have you been hospitalized for one or more nights during the past six months?   ? Yes - per chart  4.  Have you been to an emergency room one or more times in the past six months?  ? Yes - per chart  5.  In general, do you have problems with your vision that cannot be corrected with glasses/contacts?  ? No  6.  In general, do you have problems with your memory?  ? No  7.  Do you take six or more different medications every day?   ? Yes - per chart  8. Do you have any trouble getting, taking, keeping up with or paying for your medications?   ? No  9   Have you had any falls over the past few weeks or are you concerned about falling?  ? No  10.  Are you having trouble accessing and/or preparing the meals you need?  ? Yes - Pt has been getting fast food meals.  11. Do you have wounds or bed ulcers?  ? No  12  Do you have access to transportation?                          Yes - wife can drive him  ??  Care Manager Questions:  Is the patient over the age of 54?  ? No  *Home Safety Evaluation endorsed  4 of the screening items and will update personal health advocate. Personal Health Advocate Pool contacted by In Basket message regarding post-acute services and collaboration around plan of care.  ??  When patient/family asked, Were the services I provided/discussed with you today of value?, patient/family responded Y/N? Yes - pt appreciates the visit              General  Care Manager assessed the patient by : In person interview with patient, In  person interview with family, Medical record review  Orientation Level: Oriented X4  Who provides care at home?: N/A  Reason for referral: Discharge Planning, Other - specify in comments  Additional Information: ACO pt   Type of Residence: Mailing Address:  [address]  [place]  [place] [address]  Contacts: Accompanied by: Family member, Significant other   Extended Emergency Contact Information  Primary Emergency Contact: Godfrey,[name]  Address: [address] [place], [place] [address] [place]  Home Phone: [phone]  Mobile Phone: [phone]  Relation: Spouse  Secondary Emergency Contact: Molock,Bridgette   [place]  Home Phone: [phone]  Mobile Phone: [phone]  Relation: Daughter  Patient Phone Number: [phone] (home)         Medical Provider(s): Kurtis Bushman, MD  Reason for Admission: Admitting Diagnosis:  No admission diagnoses are documented for this encounter.  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  Past Surgical History:   has a past surgical history that includes Knee surgery (Right, 09/23/2006); Cardiac catheterization (03/08/2007); Cystourethroscopy (12/31/2009 ); pr inser hart pacer xvenous atrial (N/A, 05/30/2015); pr ephys eval w/ ablation supravent arrhythmia (N/A, 07/19/2015); pr upper gi endoscopy,biopsy (N/A, 02/28/2016); pr upper gi endoscopy,diagnosis (N/A, 05/07/2016); pr cath place/coron angio, img super/interp,r&l hrt cath, l hrt ventric (N/A, 02/13/2017); pr cath place/coron angio, img super/interp,w left heart ventriculography (N/A, 03/17/2017); pr insert/place flow direct cath (N/A, 03/17/2017); pr ecmo/ecls initiation veno-arterial (N/A, 03/19/2017); pr rebl ves direct,low extrem (Left, 03/19/2017); pr ecmo/ecls rmvl prph cannula open 6 yrs & older (Left, 03/25/2017); pr remv art clot iliac-pop,leg incis (Left, 03/25/2017); pr negative pressure wound therapy dme >50 sq cm (Left, 03/25/2017); pr insj/rplcmt perm dfb w/trnsvns lds 1/dual chmbr (N/A, 05/04/2017); pr prq trluml coronary stent w/angio one art/brnch (N/A, 03/12/2018); and pr cath place/coron angio, img super/interp,w left heart ventriculography (N/A, 05/12/2018).   Previous admit date: 06/02/2018    Primary Insurance- Payor: MEDICARE / Plan: MEDICARE PART A AND PART B / Product Type: *No Product type* /   Secondary Insurance ??? Secondary Insurance  MEDICAID LME CARDI*  Prescription Coverage ??? same as above  Preferred Pharmacy - [facility] - [place], [place] - [address]  [facility]  [facility] - [place], [place] - [address]  [facility] [place] - [address]  [facility] [place] - [address]  [facility]  [facility]  [facility] - [place] - [address]    Transportation home: Private vehicle  Level of function prior to admission: Independent    Contact/Decision Maker  Extended Emergency Contact Information  Primary Emergency Contact: Berkley,[name]  Address: [address]  [place]           [place], [place] [address] [place]  Home Phone: [phone]  Mobile Phone: [phone]  Relation: Spouse  Secondary Emergency Contact: Arvidson,Bridgette   [place]  Home Phone: [phone]  Mobile Phone: [phone]  Relation: Daughter    Legal Next of Kin / Guardian / POA / Advance Directives     [name] [facility]): Rushlow,[name] - [phone]    [name] [facility]): Hibler,Bridgette - Daughter - [phone]    Advance Directive (Medical Treatment)  Does patient have an advance directive covering medical treatment?: Patient has advance directive covering medical treatment, copy in chart.  Information provided on advance directive:: No  Patient requests assistance:: No    Health Care Decision Maker [[name]] (Medical & Mental Health Treatment)  Information provided on advance directive:: No  Patient requests assistance:: No    Patient Information  Lives with: Spouse/significant other, Children(Pt and wife has been living with his daughter and daughter's fiancee and toddler since 05/25/2018 after being evicted from their own home. Daughter also now has an eviction notice in the same complex and they are given until end of this month. )    Type of Residence: Private residence    Location/Detail: [address] [place], [place] [address]; Phone: [phone](Pt, wife and daughter with her family are waiting on stimulus money and tax refund to be able to move to a place in [place], [place])    Support Systems: Children, Family Members, Spouse    Home Care services in place prior to admission?: Yes  Type of Home Care services in place prior to admission: Home nursing visits, Home PT, Home OT, Other (Comment)  Current Home Care provider (Name/Phone #): [facility] RN/PT/OT/SW    Outpatient/Community Resources in place prior to admission: DSS, Clinic  Agency detail (Name/Phone #): [facility] program at [facility]; DSS of [facility] for food & nutrition services. PCP: Wilhemena Durie [phone]    Equipment Currently Used at Home: cane, straight, wheelchair, manual, walker, rolling, respiratory supplies(CPAP left behind their evicted apartment)    Currently receiving outpatient dialysis?: No    Financial Information    Need for financial assistance?: Yes  Type of financial assistance required: Community resources    Social Determinants of Health  Social Determinants of Health were addressed in provider documentation.  Please refer to patient history.    Discharge Needs Assessment  Concerns to be Addressed: adjustment to diagnosis/illness, financial/insurance, homelessness    Clinical Risk Factors: Multiple Diagnoses (Chronic), Readmission < 30 Days    Barriers to taking medications: No    Prior overnight hospital stay or ED visit in last 90 days: Yes    Readmission Within the Last 30 Days: previous discharge plan unsuccessful    Anticipated Changes Related to Illness: none    Equipment Needed After Discharge: none    Discharge Facility/Level of Care Needs: other (see comments)(Continue with [facility])    Readmission  Risk of Unplanned Readmission Score:  %  Predictive Model Details   No score data available for [place] Risk of Unplanned Readmission     Readmitted Within the Last 30 Days? (No if blank)   Patient at risk for readmission?: Yes    Discharge Plan  Screen findings are: Discharge planning needs identified or anticipated (Comment).    Expected Discharge Date:      Expected Transfer from Critical Care:      Patient and/or family were provided with choice  of facilities / services that are available and appropriate to meet post hospital care needs?: Yes   List choices in order highest to lowest preferred, if applicable. : To continue with [facility]. Has has RN/PT/OT/SW. Pt's wife has a problem with the [facility]. Advised her that she can always fire that RN and ask for another RN to replace the current one.    Initial Assessment complete?: Yes

## 2018-07-02 NOTE — Unmapped (Signed)
Pt has had n/v and weakness this morning. Pt stating he started with tightness in his chest about one hour ago. Pt was told to get looked at by the [facility].

## 2018-07-02 NOTE — Unmapped (Signed)
I was the supervising physician in the delivery of the service. Arliss Hepburn D Quilla Freeze, MD

## 2018-07-03 LAB — BASIC METABOLIC PANEL
ANION GAP: 12 mmol/L (ref 7–15)
ANION GAP: 11 mmol/L (ref 7–15)
BLOOD UREA NITROGEN: 34 mg/dL — ABNORMAL HIGH (ref 7–21)
BLOOD UREA NITROGEN: 37 mg/dL — ABNORMAL HIGH (ref 7–21)
BUN / CREAT RATIO: 9
CALCIUM: 8.5 mg/dL (ref 8.5–10.2)
CHLORIDE: 103 mmol/L (ref 98–107)
CHLORIDE: 103 mmol/L (ref 98–107)
CO2: 21 mmol/L — ABNORMAL LOW (ref 22.0–30.0)
CO2: 22 mmol/L (ref 22.0–30.0)
CO2: 22 mmol/L — ABNORMAL LOW (ref 22.0–30.0)
CREATININE: 4.08 mg/dL — ABNORMAL HIGH (ref 0.70–1.30)
EGFR CKD-EPI AA MALE: 21 mL/min/{1.73_m2} — ABNORMAL LOW (ref >=60)
EGFR CKD-EPI AA MALE: 19 mL/min/{1.73_m2} — ABNORMAL LOW (ref >=60)
EGFR CKD-EPI NON-AA MALE: 18 mL/min/{1.73_m2} — ABNORMAL LOW (ref >=60)
EGFR CKD-EPI NON-AA MALE: 16 mL/min/{1.73_m2} — ABNORMAL LOW (ref >=60)
GLUCOSE RANDOM: 149 mg/dL (ref 70–179)
GLUCOSE RANDOM: 134 mg/dL (ref 70–179)
POTASSIUM: 4.6 mmol/L (ref 3.5–5.0)
POTASSIUM: 4.8 mmol/L (ref 3.5–5.0)
SODIUM: 136 mmol/L (ref 135–145)
SODIUM: 136 mmol/L (ref 135–145)

## 2018-07-03 LAB — URINALYSIS
BILIRUBIN UA: NEGATIVE
HYALINE CASTS: 1 /LPF (ref 0–1)
KETONES UA: NEGATIVE
LEUKOCYTE ESTERASE UA: NEGATIVE
NITRITE UA: NEGATIVE
PH UA: 5 (ref 5.0–9.0)
PROTEIN UA: 100 — AB
RBC UA: 2 /HPF (ref <=3)
SQUAMOUS EPITHELIAL: <1 /HPF (ref 0–5)
SQUAMOUS EPITHELIAL: <1 /HPF — AB (ref 0–5)
UROBILINOGEN UA: 0.2
WBC UA: 2 /HPF (ref <=2)

## 2018-07-03 LAB — MAGNESIUM: MAGNESIUM: 1.6 mg/dL (ref 1.6–2.2)

## 2018-07-03 LAB — CBC
HEMATOCRIT: 29.9 % — ABNORMAL LOW (ref 41.0–53.0)
HEMOGLOBIN: 9.5 g/dL — ABNORMAL LOW (ref 13.5–17.5)
MEAN CORPUSCULAR HEMOGLOBIN CONC: 31.9 g/dL (ref 31.0–37.0)
MEAN CORPUSCULAR HEMOGLOBIN: 26.7 pg (ref 26.0–34.0)
MEAN CORPUSCULAR VOLUME: 83.9 fL (ref 80.0–100.0)
MEAN PLATELET VOLUME: 8.3 fL (ref 7.0–10.0)
MEAN PLATELET VOLUME: 8.3 fL — ABNORMAL LOW (ref 7.0–10.0)
RED BLOOD CELL COUNT: 3.57 10*12/L — ABNORMAL LOW (ref 4.50–5.90)
RED CELL DISTRIBUTION WIDTH: 15.5 % — ABNORMAL HIGH (ref 12.0–15.0)

## 2018-07-03 LAB — BLOOD GAS CRITICAL CARE PANEL, VENOUS: HEMOGLOBIN BLOOD GAS: 9.8 g/dL — ABNORMAL LOW (ref 13.50–17.50)

## 2018-07-03 NOTE — Unmapped (Signed)
Physician Discharge Summary [facility]  [address]  [address]  [place] [address]  Dept: [phone]  Loc: [phone]     Identifying Information:   Shawn Route Gist  11-18-1970  [id number]    Primary Care Physician: Kurtis Bushman, MD   Code Status: Full Code    Admit Date: 07/02/2018    Discharge Date: 07/03/2018     Discharge To: Home with Home Health and/or PT/OT    Discharge Service: CARD Anderson 1     Discharge Attending Physician: No att. 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 *      Outpatient Provider Follow Up Issues:   -Repeat BMP in approxi-1 week to recheck serum creatinine    Hospital Course:   Shawn Route Jutras is a 48 y.o. male with a history of HTN, OSA, T2DM, CKD Stage IV, CAD c/b NSTEMI, HFrEF 2/2 ICM, and TIA, who presented with generalized weakness, nausea, vomiting, and chest tightness to the [facility] ED and was transferred to [facility] due to elevated troponin    Hospital course by problem below:    Type 2 MI vs NSTEMI: Patient initially presented with stable vitals notable for elevated troponin 0.36.  EKG without significant ST elevations, but did have some notable increase in previously observed T wave inversions.  He was given loading dose of aspirin and fluids and was ultimately transferred to [facility] for further evaluation.  Repeat troponin of 0.35.  Patient's chest pain resolved mostly and appears to be more associated with his persistent abdominal pain and nausea and vomiting less so acute coronary syndrome.    AKI on CKD: Stage IV CKD, with baseline Cr ~ 3.3-3.5, although looking back his creatinine has been higher recently.  He received 1 dose of 80 mg IV Lasix for volume overload.  Creatinine on discharge 3.75.  He will need to have repeat labs completed in approximately 1 week to follow-up.    CAD: History of extensive CAD with multiple stents. Had an STEMI in 2018 with VT arrest (s/p ICD) with DES to mLAD. Presented with NSTEMI on 03/12/2018 and had 2x DES placed in  The mid RCA and RPLA/RPDA bifurcation. LHC on 05/13/18 which showed diffuse moderate to severe lesion in the left coronary system (90% occlusion in proximal to mid LAD) for which he underwent successful PCI of mid LAD with a DES.  We continued his home meds during admission.    HFrEF: Secondary to ICM. Echocardiogram on March 2020 showed an EF of 40-45% with segmental wall motion abnormality corresponding to the LAD artery distribution.  Patient appeared to be volume overloaded when he first presented to [facility].  He was given one-time dose of 80 mg IV Lasix with appropriate diuresis.  We otherwise continued his home medications and he is okay to start back on his Bumex upon discharge.    Paroxysmal AFib: Continued his home amiodarone 200 mg daily  Procedures:  None  No admission procedures for hospital encounter.  ______________________________________________________________________  Discharge Medications:     Your Medication List      STOP taking these medications    polyethylene glycol 17 gram packet  Commonly known as:  MIRALAX     predniSONE 10 MG tablet  Commonly known as:  DELTASONE        CONTINUE taking these medications    acetaminophen 500 MG tablet  Commonly known as:  TYLENOL  TAKE 2 TABLETS (1 000MG ) BY MOUTH EVERY 8 HOURS AS NEEDED FOR PAIN     allopurinoL 100 MG tablet  Commonly known as:  ZYLOPRIM  Take 0.5 tablets (50 mg total) by mouth every other day.     amiodarone 200 MG tablet  Commonly known as:  PACERONE  TAKE 1 TABLET BY MOUTH ONCE DAILY     arm brace Misc  1 each by Miscellaneous route daily.     atorvastatin 80 MG tablet  Commonly known as:  LIPITOR  Take 1 tablet (80 mg total) by mouth daily.     blood sugar diagnostic Strp  USE TO CHECK BLOOD SUGAR 4 TIMES DAILY (BEFORE MEALS AND NIGHTLY)     blood-glucose meter kit  Use to check blood sugars, #1 meter, #100 strips. Freestyle glucose meter. ICD-9 250.00     BRILINTA 90 mg Tab  Generic drug:  ticagrelor  TAKE 1 TABLET BY MOUTH TWICE DAILY     bumetanide 2 MG tablet  Commonly known as:  BUMEX  TAKE 1 TABLET BY MOUTH ONCE DAILY     carvediloL 25 MG tablet  Commonly known as:  COREG  TAKE 1 TABLET BY MOUTH WITH THE MORNING MEAL AND TAKE 2 TABLETS WITH THE EVENING MEAL     cholecalciferol (vitamin D3) 5,000 unit capsule  Take 1 capsule (5,000 Units total) by mouth daily.     diclofenac sodium 1 % gel  Commonly known as:  VOLTAREN  Apply 2 g topically Three (3) times a day as needed for pain.     empty container Misc  Use as directed to dispose of injectable medications     ezetimibe 10 mg tablet  Commonly known as:  ZETIA  TAKE 1 TABLET BY MOUTH ONCE DAILY     FLUoxetine 20 MG capsule  Commonly known as:  PROzac  Take 3 capsules (60 mg total) by mouth nightly.     FREESTYLE NAVIGATOR GLUC SENS Devi  Generic drug:  blood-glucose sensor  Frequency:BID   Dosage:0.0     Instructions:  Note:Dose: 1     gabapentin 300 MG capsule  Commonly known as:  NEURONTIN  Take 1 capsule (300 mg total) by mouth two (2) times a day.     insulin detemir U-100 100 unit/mL (3 mL) injection pen  Commonly known as:  LEVEMIR  Inject 0.4 mL (40 Units total) under the skin nightly.     insulin syringe-needle U-100 0.3 mL 31 gauge x 5/16 Syrg  Use to inject insulin twice daily with meals     isosorbide mononitrate 30 MG 24 hr tablet  Commonly known as:  IMDUR  Take 1 tablet (30 mg total) by mouth daily.     melatonin 3 mg Cap  Take 9 mg by mouth nightly. nitroglycerin 0.4 MG SL tablet  Commonly known as:  NITROSTAT  Place 1 tablet (0.4 mg total) under the tongue every five (5) minutes as needed for chest pain.     NovoLOG Flexpen  U-100 Insulin 100 unit/mL (3 mL) injection pen  Generic drug:  insulin ASPART  Inject 0.15 mL (15 Units total) under the skin Three (3) times a day with a meal.     omeprazole 20 MG capsule  Commonly known as:  PriLOSEC  Take 1 capsule (20 mg total) by mouth daily.     ON CALL LANCET 30 gauge Misc  Generic drug:  lancets  USE TO CHECK BLOOD SUGAR THREE TIMES DAILY WITH MEALS     oxyCODONE-acetaminophen 5-325 mg per tablet  Commonly known as:  PERCOCET  Take 1 tablet by mouth every eight (8) hours as needed.     OZEMPIC 0.25 mg or 0.5 mg(2 mg/1.5 mL) Pnij  Generic drug:  semaglutide  Inject 0.25 mg under the skin every seven (7) days. For 4 weeks then increase to 0.5mg  every 7 days     PRALUENT PEN 150 mg/mL subcutaneous injection  Generic drug:  alirocumab  Inject 1 mL (150 mg total) under the skin every fourteen (14) days.     QUEtiapine 50 MG tablet  Commonly known as:  SEROqueL  Take 1 tablet (50 mg total) by mouth two (2) times a day as needed (headache).     RENVELA 800 mg tablet  Generic drug:  sevelamer  TAKE 2 TABLETS (1600MG ) BY MOUTH THREE TIMES DAILY WITH MEALS     traMADoL 50 mg tablet  Commonly known as:  ULTRAM  Take 1 tablet (50 mg total) by mouth every six (6) hours as needed.     UNIFINE PENTIPS 31 gauge x 5/16 Ndle  Generic drug:  pen needle, diabetic  USE WITH INSULIN AND VICTOZA 5 TIMES DAILY     VENTOLIN HFA 90 mcg/actuation inhaler  Generic drug:  albuterol  Inhale 2 puffs into the lungs every 6 (six) hours as needed for wheezing or shortness of breath.     XARELTO 15 mg Tab  Generic drug:  rivaroxaban  Take 1 tablet (15 mg total) by mouth daily with evening meal.            Allergies:  Losartan and Morphine  ______________________________________________________________________  Pending Test Results (if blank, then none):      Most Recent Labs:  All lab results last 24 hours -   Recent Results (from the past 24 hour(s))   ECG 12 lead (Adult)    Collection Time: 07/02/18  3:07 PM   Result Value Ref Range    EKG Systolic BP  mmHg    EKG Diastolic BP  mmHg    EKG Ventricular Rate 95 BPM    EKG Atrial Rate 95 BPM    EKG P-R Interval 194 ms    EKG QRS Duration 90 ms    EKG Q-T Interval 390 ms    EKG QTC Calculation 490 ms    EKG Calculated P Axis 43 degrees    EKG Calculated R Axis -2 degrees    EKG Calculated T Axis 131 degrees    QTC Fredericia 454 ms   Comprehensive Metabolic Panel    Collection Time: 07/02/18  3:22 PM   Result Value Ref Range    Sodium 140 135 - 145 mmol/L    Potassium 4.6 3.5 - 5.0 mmol/L    Chloride 103 98 - 107 mmol/L    Anion Gap 15 7 - 15 mmol/L    CO2 22.0 22.0 - 30.0 mmol/L    BUN 30 (H) 7 - 21 mg/dL    Creatinine 2.44 (H)  0.70 - 1.30 mg/dL    BUN/Creatinine Ratio 8     EGFR CKD-EPI Non-African American, Male 19 (L) >=60 mL/min/1.71m2    EGFR CKD-EPI African American, Male 22 (L) >=60 mL/min/1.49m2    Glucose 154 65 - 179 mg/dL    Calcium 9.6 8.5 - 29.5 mg/dL    Albumin 3.5 3.5 - 5.0 g/dL    Total Protein 6.9 6.5 - 8.3 g/dL    Total Bilirubin 0.3 0.0 - 1.2 mg/dL    AST 11 (L) 19 - 55 U/L    ALT 9 <50 U/L    Alkaline Phosphatase 104 38 - 126 U/L   Troponin I    Collection Time: 07/02/18  3:22 PM   Result Value Ref Range    Troponin I 0.360 (HH) <0.060 ng/mL   Pro-BNP    Collection Time: 07/02/18  3:22 PM   Result Value Ref Range    PRO-BNP 4,560.0 (H) 0.0 - 138.0 pg/mL   PT-INR    Collection Time: 07/02/18  3:22 PM   Result Value Ref Range    PT 12.3 10.2 - 13.1 sec    INR 1.07    APTT    Collection Time: 07/02/18  3:22 PM   Result Value Ref Range    APTT 36.0 25.9 - 39.5 sec    Heparin Correlation 0.2    CBC w/ Differential    Collection Time: 07/02/18  3:22 PM   Result Value Ref Range    WBC 13.8 (H) 4.5 - 11.0 10*9/L    RBC 4.02 (L) 4.50 - 5.90 10*12/L    HGB 10.7 (L) 13.5 - 17.5 g/dL    HCT 62.1 (L) 30.8 - 53.0 %    MCV 82.3 80.0 - 100.0 fL    MCH 26.5 26.0 - 34.0 pg    MCHC 32.2 31.0 - 37.0 g/dL    RDW 65.7 (H) 84.6 - 15.0 %    MPV 7.1 7.0 - 10.0 fL    Platelet 345 150 - 440 10*9/L    Neutrophils % 79.1 %    Lymphocytes % 12.2 %    Monocytes % 5.2 %    Eosinophils % 1.7 %    Basophils % 0.3 %    Absolute Neutrophils 10.9 (H) 2.0 - 7.5 10*9/L    Absolute Lymphocytes 1.7 1.5 - 5.0 10*9/L    Absolute Monocytes 0.7 0.2 - 0.8 10*9/L    Absolute Eosinophils 0.2 0.0 - 0.4 10*9/L    Absolute Basophils 0.0 0.0 - 0.1 10*9/L    Large Unstained Cells 2 0 - 4 %   Lipase Level    Collection Time: 07/02/18  3:22 PM   Result Value Ref Range    Lipase 63 44 - 232 U/L   Magnesium Level    Collection Time: 07/02/18  3:22 PM   Result Value Ref Range    Magnesium 1.7 1.6 - 2.2 mg/dL   Hemoglobin N6E    Collection Time: 07/02/18  3:22 PM   Result Value Ref Range    Hemoglobin A1C 9.5 (H) 4.8 - 5.6 %    Estimated Average Glucose 226 mg/dL   TSH    Collection Time: 07/02/18  3:22 PM   Result Value Ref Range    TSH 4.832 (H) 0.600 - 3.300 uIU/mL   POCT Glucose    Collection Time: 07/02/18  3:32 PM   Result Value Ref Range    Glucose, POC 142 70 - 179 mg/dL   Troponin I  Collection Time: 07/02/18  5:16 PM   Result Value Ref Range    Troponin I 0.350 (HH) <0.060 ng/mL   POCT Glucose    Collection Time: 07/02/18  9:11 PM   Result Value Ref Range    Glucose, POC 172 70 - 179 mg/dL   Blood Gas Critical Care Panel, Venous    Collection Time: 07/02/18  9:27 PM   Result Value Ref Range    Specimen Source Venous     FIO2 Venous Room Air     pH, Venous 7.41 7.32 - 7.43    pCO2, Ven 36 (L) 40 - 60 mm Hg    pO2, Ven 47 30 - 55 mm Hg    HCO3, Ven 22 22 - 27 mmol/L    Base Excess, Ven -2.0 -2.0 - 2.0    O2 Saturation, Venous 80.7 40.0 - 85.0 %    Sodium Whole Blood 134 (L) 135 - 145 mmol/L    Potassium, Bld 4.1 3.4 - 4.6 mmol/L    Calcium, Ionized Venous 4.53 4.40 - 5.40 mg/dL    Glucose Whole Blood 182 (H) 70 - 179 mg/dL    Lactate, Venous 1.6 0.5 - 1.8 mmol/L    Hgb, blood gas 9.80 (L) 13.50 - 17.50 g/dL   ECG 12 Lead    Collection Time: 07/02/18 10:18 PM   Result Value Ref Range    EKG Systolic BP  mmHg    EKG Diastolic BP  mmHg    EKG Ventricular Rate 88 BPM    EKG Atrial Rate 88 BPM    EKG P-R Interval 208 ms    EKG QRS Duration 90 ms    EKG Q-T Interval 410 ms    EKG QTC Calculation 496 ms    EKG Calculated P Axis 35 degrees    EKG Calculated R Axis  degrees    EKG Calculated T Axis 132 degrees    QTC Fredericia 466 ms   POCT Glucose    Collection Time: 07/03/18  5:28 AM   Result Value Ref Range    Glucose, POC 147 70 - 179 mg/dL   APTT    Collection Time: 07/03/18  6:27 AM   Result Value Ref Range    APTT 47.6 (H) 25.9 - 39.5 sec    Heparin Correlation 0.2    Basic metabolic panel    Collection Time: 07/03/18  6:27 AM   Result Value Ref Range    Sodium 136 135 - 145 mmol/L    Potassium 4.6 3.5 - 5.0 mmol/L    Chloride 103 98 - 107 mmol/L    CO2 21.0 (L) 22.0 - 30.0 mmol/L    Anion Gap 12 7 - 15 mmol/L    BUN 34 (H) 7 - 21 mg/dL    Creatinine 1.61 (H) 0.70 - 1.30 mg/dL    BUN/Creatinine Ratio 9     EGFR CKD-EPI Non-African American, Male 18 (L) >=60 mL/min/1.59m2    EGFR CKD-EPI African American, Male 21 (L) >=60 mL/min/1.71m2    Glucose 149 70 - 179 mg/dL    Calcium 8.5 8.5 - 09.6 mg/dL   Magnesium Level    Collection Time: 07/03/18  6:27 AM   Result Value Ref Range    Magnesium 1.6 1.6 - 2.2 mg/dL   CBC    Collection Time: 07/03/18  6:27 AM   Result Value Ref Range    WBC 12.3 (H) 4.5 - 11.0 10*9/L    RBC 3.57 (L) 4.50 - 5.90  10*12/L    HGB 9.5 (L) 13.5 - 17.5 g/dL    HCT 16.1 (L) 09.6 - 53.0 %    MCV 83.9 80.0 - 100.0 fL    MCH 26.7 26.0 - 34.0 pg    MCHC 31.9 31.0 - 37.0 g/dL    RDW 04.5 (H) 40.9 - 15.0 %    MPV 8.3 7.0 - 10.0 fL    Platelet 318 150 - 440 10*9/L   POCT Glucose    Collection Time: 07/03/18 11:00 AM   Result Value Ref Range    Glucose, POC 173 70 - 179 mg/dL       Relevant Studies/Radiology (if blank, then none):  Xr Chest 2 Views Result Date: 07/02/2018  EXAM: XR CHEST 2 VIEWS DATE: 07/02/2018 3:41 PM ACCESSION: [id number] UN DICTATED: 07/02/2018 3:44 PM INTERPRETATION LOCATION: [facility] CLINICAL INDICATION: 48 years old Male with CHEST PAIN  COMPARISON: 05/31/2018 TECHNIQUE: PA and Lateral Chest Radiographs. FINDINGS: Right chest wall pacer/AICD and leads are unchanged. The lungs are slightly hypoinflated. No focal airspace consolidation is seen. Stable cardiomediastinal silhouette. Multilevel old left posterior rib fractures are noted. Radiographically clear lungs. No pleural effusion or pneumothorax. Unremarkable cardiomediastinal silhouette.     No focal consolidation.    ______________________________________________________________________  Discharge Instructions:           Other Instructions     Call MD for:  difficulty breathing, headache or visual disturbances      Call MD for:  extreme fatigue      Call MD for:  persistent dizziness or light-headedness      Call MD for:  persistent nausea or vomiting      Call MD for: Temperature > 38.5 Celsius ( > 101.3 Fahrenheit)      Discharge instructions      There is a pleasure in care of you.    Your admitted to the hospital with chest pain and nausea and vomiting.  We evaluated you for acute coronary syndrome currently do not believe that you are experiencing an acute coronary event.  We believe that you had some fluid overload which caused some kidney dysfunction believe it should be improved with the appropriate diuresis you were given.    You are free to continue on your previous medications as previously prescribed.    We would like you to repeat some lab work in approximately 1 week.  Can be completed whenever [facility] lab is most convenient for you.                    Follow Up instructions and Outpatient Referrals     Ambulatory referral to Home Health      Disciplines requested:   Nursing  Physical Therapy  Occupational Therapy  Medical Social Work       Nursing requested:  Other: (please enter in comments)    Physical Therapy requested:   Home safety evaluation  Evaluate and treat  Strengthening exercises       Occupational Therapy Requested:   Home safety evaluation  Strengthening exercises  Evaluate and treat       Medical Social Work requested:  Other (please enter in comments) Comment - Please assist with finding housing assistance    Physician to follow patient's care:  PCP    Requested start of care date:  Routine (within 48 hours)    Basic metabolic panel      Is this a fasting order?:  No     BMP contains the  following laboratory tests: NA, K, CL, CO2, BUN, CR, GLUC, and CA.     Call MD for:  difficulty breathing, headache or visual disturbances      Call MD for:  extreme fatigue      Call MD for:  persistent dizziness or light-headedness      Call MD for:  persistent nausea or vomiting      Call MD for: Temperature > 38.5 Celsius ( > 101.3 Fahrenheit)      Discharge instructions            Appointments which have been scheduled for you    Jul 20, 2018  9:30 AM EDT  (Arrive by 9:15 AM)  PHONE with Mila Merry, MD  [place] NEPHROLOGY [facility]) [address]  [place]  [place] [address]  [phone]   Please DO NOT come to the clinic for this visit. We will call you to discuss your plan of care.      Jul 22, 2018  3:50 PM EDT  (Arrive by 3:35 PM)  Danelle Earthly RETURN with Marlaine Hind, MD  [facility] HEART VASCULAR CTR [address] [facility] REGION) [address]  [address]  [place] [address]  [phone]      Jul 23, 2018 10:30 AM EDT  (Arrive by 10:15 AM)  RETURN ANTICOAG with Leda Min, PA  [facility] INTERNAL MEDICINE [place] [facility] REGION) [address] [place] [address]  [phone]      Jul 27, 2018 12:30 AM EDT  (Arrive by 12:00 AM)  REMOTE ICD FOLLOW-UP with [facility] EP REMOTE MONITORING  [facility] EP REMOTE MONITORING [facility] REGION) [address]  [address] [place] [address]  [phone]   This is a remote appointment, you do not need to come into the office.     Polo 11, 2020 10:00 AM EDT  (Arrive by 9:45 AM)  PHONE with Lyda Kalata Max, CPP  [facility] HEART VASCULAR CTR [address] [facility] [facility] REGION) [address]  [address]  [place] [address]  [phone]   Please DO NOT come to the clinic for this visit. We will call you to discuss your plan of care.      Sep 21, 2018 10:00 AM EDT  RETURN  RETINA with Melvyn Novas, MD  [facility] OPHTHALMOLOGY [address] [place] [facility] REGION) [address]  [address]  [place] [address]  [phone]      Sep 30, 2018  9:00 AM EDT  (Arrive by 8:45 AM)  Renea Ee with [facility] INTERNAL MEDICINE [place] [facility] REGION) [address]  [place] [address]  [phone]      Sep 30, 2018  1:00 PM EDT  (Arrive by 12:45 PM)  RETURN  ATTENDING with Roma Schanz, MD  [facility] INTERNAL MEDICINE [place] [facility] REGION) [address] [place] [address]  [phone]           ______________________________________________________________________  Discharge Day Services:  BP 99/54  - Pulse 84  - Temp 36.6 ??C (Oral)  - Resp 18  - Ht 188 cm (6' 2)  - Wt (!) 151.2 kg (333 lb 4.8 oz)  - SpO2 96%  - BMI 42.79 kg/m??   Pt seen on  the day of discharge and determined appropriate for discharge.    Condition at Discharge: good    Length of Discharge: I spent greater than 30 mins in the discharge of this patient.

## 2018-07-03 NOTE — Unmapped (Addendum)
Jeremy Oliver is a 48 y.o. 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 due to elevated troponin    Hospital course by problem below:    Type 2 MI vs NSTEMI: Patient initially presented with stable vitals notable for elevated troponin 0.36.  EKG without significant ST elevations, but did have some notable increase in previously observed T wave inversions.  He was given loading dose of aspirin and fluids and was ultimately transferred to [facility] for further evaluation.  Repeat troponin of 0.35.  Patient's chest pain resolved mostly and appears to be more associated with his persistent abdominal pain and nausea and vomiting less so acute coronary syndrome.    AKI on CKD: Stage IV CKD, with baseline Cr ~ 3.3-3.5, although looking back his creatinine has been higher recently.  He received 1 dose of 80 mg IV Lasix for volume overload.  Creatinine on discharge 3.75.  He will need to have repeat labs completed in approximately 1 week to follow-up.    CAD: History of extensive CAD with multiple stents. Had an STEMI in 2018 with VT arrest (s/p ICD) with DES to mLAD. Presented with NSTEMI on 03/12/2018 and had 2x DES placed in  The mid RCA and RPLA/RPDA bifurcation. LHC on 05/13/18 which showed diffuse moderate to severe lesion in the left coronary system (90% occlusion in proximal to mid LAD) for which he underwent successful PCI of mid LAD with a DES.  We continued his home meds during admission.    HFrEF: Secondary to ICM. Echocardiogram on March 2020 showed an EF of 40-45% with segmental wall motion abnormality corresponding to the LAD artery distribution.  Patient appeared to be volume overloaded when he first presented to [facility].  He was given one-time dose of 80 mg IV Lasix with appropriate diuresis.  We otherwise continued his home medications and he is okay to start back on his Bumex upon discharge.    Paroxysmal AFib: Continued his home amiodarone 200 mg daily

## 2018-07-03 NOTE — Unmapped (Signed)
Cardiology H&P    Assessment/Plan:    Principal Problem:    Elevated troponin  Active Problems:    CAD (coronary artery disease) (RAF-[name])    Hypercholesteremia    Essential hypertension (RAF-[name])    Paroxysmal atrial fibrillation (CMS-[name])    Tobacco use disorder    Chronic heart failure with reduced ejection fraction and diastolic dysfunction (CMS-[name])    CKD (chronic kidney disease) stage 4, GFR 15-29 ml/min (CMS-[name])      Jeremy Oliver is a 48 y.o. male with extensive history of CAD s/p multiple PCIs, hx of VT after STEMI in 2018 s/p ICD, HF  (most recent EF 40-45% 05/2018), paroxysmal A. fib on Xarelto, HTN, HLD, T2DM, morbid obesity, CKD who presented to [facility] from [facility] ED for evaluation of weakness/fatigue, N/V, chest pain.    Chest pain, CAD: History of extensive CAD with multiple stents. Had an STEMI in 2018 with VT arrest (s/p ICD) with DES to mLAD. Presented with NSTEMI on 03/12/2018 and had 2x DES placed in the mid RCA and RPLA/RPDA bifurcation. Another NSTEMI in February w/ PCI to proximal/mid LAD. Episode of substernal chest pressure this morning/afternoon that lasted 10-15 minutes and resolved spontaneously. Initial troponin 0.360 with repeat at 0.35. ECG with TWIs in I and aVL which are more pronounced compared to 2022-06-24. Currently asymptomatic.   - Low suspicion for true ACS event but will make NPO just in case pain returns in setting of his severe CAD. LHC seems unlikely given no sx currently along with his Cr, though he has been cathed with a Cr in the 3s before  - s/p asa 325 mg in ED. Not on daily asa in setting of coag for A. fib  - Continue home Brilinta 90 mg BID (has not missed any doses since last cath)  - Heparin gtt is arrhythmia nomogram as we hold xarelto in case of LHC - not ordering for true NSTEMI.  - Continue home Lipitor 80 mg daily, Zetia     --direct LDL 178 05/2018 - gets alirocumab injections every 2 weeks  - Continue home Carvedilol (patient unsure of the dose, confirm with wife in am that it's 25 in the morning and 50 at night)  - Continue home Imdur 30 mg daily, prn SL nitro    - if pain continues he could go up on the imdur, but he's had extensive history of syncopal episodes as below where providers have made sure he's not getting hypotensive, though it sounds like episodes are more c/w pseudoseizures  - No longer on losartan d/t history of hyperK  - Monitor on telemetry  - Repeat echo  - needs to quit tobacco use if possible (dipping) - tobacco cessation consult    Possible syncope: Last episode occurring en route to [facility] with EMS. Currently lightheaded but exam wnl. 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 Has been evaluated by neuro. Is on a lot of medications which have been recently evaluated and adjusted. Is on gabapentin for neuropathy which will need to be monitored w/ his renal function. Seroquel recently started by psych in setting of his anxiety and N/V. Will be sure to rule out cardiac cause, however chart review points strongly toward pseudoseizures.  - orthostatics   - monitor on tele, consider evaluation of pacemaker  - echo as above which is more to rule out wall motion abnormalities or change in EF with his chest pain    Nausea and vomiting: Patient states this is happening  on a daily basis.  Outpatient providers told him it could be due to a gallbladder issue though his LFTs appear normal here and he has no abdominal pain with eating.  He does not take any medications for the symptoms.  He states that there is a plan to work this up outpatient once the virus situation is under control.  Cannot rule out cardiac event given the timing of the emesis with the chest pain, but seems less likely since this is happening on a daily basis.  - zofran as needed  - Inpatient eval should include CAD workup as above and diuresis as below.    HF w/ borderline EF: Hx of preserved EF but down to 40-45% in March after NSTEMI. Takes bumex 2 mg daily. Recently has been sleeping on 2 pillows instead of 1 along with a cough productive of clear/yellow sputum (afebrile, not short of breath, no sick contacts) with pro BNP elevated to 4500 compared to 1700 last month. Not clearly volume overloaded on exam.  - IV lasix 80 mg x 1 - re-evaluate in am  - home coreg  - unable to take ACEi/ARB or spiro with renal function  - repeat echo    pAF: NSR on arrival  - Home coreg 25 mg qam and 50mg  qpm  - Home amio 200 daily (possibly for hx of VT?)  - Hold home xarelto in case of LHC (last dose 4/2). Ordered heparin gtt    HTN: Now off amlodipine, hydralazine and losartan in setting of these syncopal episodes vs pseudoseizures.  - carvedilol as above    T2DM:  A1c 9.7 in December. Takes lantus 40U nightly along with lispro 15 U TID AC at home. Also   - Lantus 30U nightly, Lispro 10U TID AC while NPO  - SSI  - holding home ozempic while inpatient.    - has borderline low EF and CKD but the ozempic for CAD benefit [place] outweigh benefit of SGLT2i  - home gabapentin    CKD: Baseline Cr is around 3. Cr on presentation 3.53. Cr bump seems due to volume overload with the elevated pro-BNP. He's had LHCs in the past with Cr this high which has bumped it up to 5.7.  - Diuresis as above  - home sevelamer 1600 TID w/ meals    Depression: home prozac   Gout: home allopurinol. Currently with some pain in his foot but holding off on prednisone for now    Diet: Heart healthy, NPO at midnight in case of LHC  DVT Ppx: heparin gtt  GI Ppx: home prilosec    Code Status: Full code  Emergency Contact: [name] Sprenkle (wife) [phone]    Dispo: Admit to [facility], inpatient and floor status    ___________________________________________________________________    HPI:  Jeremy Route Govoni is a 48 y.o. male with extensive history of CAD s/p multiple PCIs, hx of VT after STEMI in 2018 s/p ICD, HFpEF (most recent EF 40-45% 05/2018) s/p ICD, paroxysmal A. fib on Xarelto, HTN, HLD, T2DM, morbid obesity, CKD who presented to [facility] from [facility] ED for evaluation of weakness/fatigue, N/V, chest pain.    Patient states he was in the car this morning with his wife when he began to feel nauseous and had one episode of emesis.  Of note, has an episode of vomiting once per day and will soon be undergoing outpatient work-up.  Shortly after this episode he continued to feel nauseous and developed substernal chest pressure which he described as  an elephant on his chest. Pain did not radiate. No associated SOB or diaphoresis but did become lightheaded which is common for him. Pain lasted about 10 minutes and went away on its own as patient did not have any nitro with him. Hours later he had another episode that was very similar which lasted about 15 minutes and again went away on its own.  He then presented to the [facility] ED for further evaluation.    In the ED his HR was 96 and sinus, BP 140/97, RR 20, SpO2 98% on room air. Labs were notable for WBC 13.8 but afebrile, Hgb 10.8, Plts 345, Cr 3.53, K 4.6, Mg 1.7, normal LFTs and lipase. Mildly elevated troponin at 0.360 with repeat at 0.35. ECG with TWIs in I and aVL which are more pronounced compared to July 12, 2022. Pro-BNP 4500 compared to 1700 one month prior. CXR did not show any focal consolidation or congestion.    He received 324 mg of ASA and 500 mL of normal saline and was transferred to [facility]. En route, EMS says that patient started to feel nauseous and lightheaded and then syncopized. Vitals remained stable and EMS states that he was in a paced rhythm, though we do not have the strips. No tongue biting, bowel or bladder incontincence. He spontaneously awoke seconds later and was not post ictal. Only sx upon awakening were lightheadedness.    On arrival to [facility] he denies any complaints and appears comfortable. Only mild lightheadedness but he isn't experiencing that now as he's laying down. He can remember the events preceding the syncopal event in EMS and denied having any feelings of palpitations, chest pain, or shortness of breath. He says this happens to him often.    Review of Systems:  10 systems reviewed and are negative unless otherwise mentioned in HPI    Cardiovascular History & Procedures:  Risk Factors: hypertension, hyperlipidemia, tobacco use and diabetes mellitus    Cath / PCI:  05/12/18  1. Coronary artery disease including diffuse moderate to severe lesions in the left coronary system, with particular emphasis on the 90% proximal to mid LAD in-stent restenosis.  2. Successful PCI to the proximal to mid LAD with the placement of a Synergy drug eluting stent with excellent angiographic result and TIMI 3 flow.  3. Subsequent impaired flow at an adjacent moderate diagonal, which was small and had pre-existing ostial disease. Recrossed and treated with balloon angioplasty with restored flow.  4. Recent stent to RPDA/RPLA bifurcation widely patent.    CV Surgery:  ??  None    EP Procedures and Devices:  ?? Dual chamber pacemaker upgraded to dual chamber ICD 05/2017    Non-Invasive Evaluation(s):  ?? Echo 06/02/2018  ?? Mildly decreased global left ventricular systolic function (EF 40-45%), with segmental wall motion abnormalities in the distribution of the anterior descending artery, unchanged from the previous study of 05/13/2018.  ?? Normal right ventricular systolic function.  ?? No gross structural valvular abnormalities.      Medical History:  Past Medical History:   Diagnosis Date   ??? Angina at rest (CMS-[name])    ??? Arthritis    ??? Asthma    ??? Atrial fibrillation and flutter (CMS-[name]) 06/08/14   ??? BMI 40.0-44.9, adult (CMS-[name]) 10/29/2015   ??? Chronic anticoagulation 02/17/2018   ??? Chronic kidney disease, stage 3 (CMS-[name])    ??? Chronic pain syndrome 04/11/2014    ~Use daily lyrica as recommended ~Tramadol as needed for pain ~Fluoxetine daily as recommended ~  Daily exercise ~Eat a healthy diet ~Good control of your blood sugars can help    ??? Coronary artery disease    ??? Depression    ??? Diabetes mellitus (CMS-[name])    ??? Eye trauma 1998    glass in both eyes and removed   ??? GERD (gastroesophageal reflux disease)    ??? Gout    ??? Homeless    ??? Hyperlipidemia    ??? Hypertension    ??? Illiteracy and low-level literacy    ??? Microscopic hematuria    ??? Migraine    ??? Nephrolithiasis    ??? 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 Nonalcoholic fatty liver disease    ??? NSTEMI (non-ST elevated myocardial infarction) (CMS-[name]) 01/13/2017   ??? Obesity    ??? Obstructive sleep apnea 2009   ??? Panic attacks    ??? 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 Seizure-like activity (CMS-[name])     ~Monitor for symptoms and report them to your primary care provider if they occur    ??? Systolic CHF, chronic (CMS-[name])     EF 40-45% 2017   ??? TIA (transient ischemic attack)        Surgical History:  Past Surgical History:   Procedure Laterality Date   ??? CARDIAC CATHETERIZATION  03/08/2007   ??? CYSTOURETHROSCOPY  12/31/2009     Microscopic hematuria   ??? KNEE SURGERY Right 09/23/2006    arthroscopic knee surgery medial and lateral meniscus tears as well as crystal disease   ??? PR CATH PLACE/CORON ANGIO, IMG SUPER/INTERP,R&L HRT CATH, L HRT VENTRIC N/A 02/13/2017    Procedure: Left/Right Heart Catheterization;  Surgeon: Marlaine Hind, MD;  Location: [facility] CATH;  Service: Cardiology   ??? PR CATH PLACE/CORON ANGIO, IMG SUPER/INTERP,W LEFT HEART VENTRICULOGRAPHY N/A 03/17/2017    Procedure: Left Heart Catheterization W Intervention;  Surgeon: Marlaine Hind, MD;  Location: [facility] CATH;  Service: Cardiology   ??? PR CATH PLACE/CORON ANGIO, IMG SUPER/INTERP,W LEFT HEART VENTRICULOGRAPHY N/A 05/12/2018    Procedure: CATH LEFT HEART CATHETERIZATION W INTERVENTION;  Surgeon: Dorathy Kinsman, MD;  Location: [facility] CATH;  Service: Cardiology   ??? PR ECMO/ECLS INITIATION VENO-ARTERIAL N/A 03/19/2017    Procedure: ECMO/ECLS; INITIATION, VENO-ARTERIAL;  Surgeon: Lennie Odor, MD;  Location: MAIN OR [facility];  Service: Cardiac Surgery   ??? PR ECMO/ECLS RMVL PRPH CANNULA OPEN 6 YRS & OLDER Left 03/25/2017    Procedure: ECMO / ECLS PROVIDED BY PHYSICIAN; REMOVAL OF PERIPHERAL (ARTERIAL AND/OR VENOUS) CANNULA(E), OPEN, 6 YEARS AND OLDER;  Surgeon: Alonna Buckler Ikonomidis, MD;  Location: MAIN OR [facility];  Service: Cardiac Surgery   ??? PR EPHYS EVAL W/ ABLATION SUPRAVENT ARRHYTHMIA N/A 07/19/2015    Catheter ablation of cavotricuspid isthmus for atrial flutter [facility])   ??? PR INSER HART PACER XVENOUS ATRIAL N/A 05/30/2015    [employer] dual-chamber pacemaker implant (E.Chung)   ??? PR INSERT/PLACE FLOW DIRECT CATH N/A 03/17/2017    Procedure: Insert Leave In [place];  Surgeon: Marlaine Hind, MD;  Location: [facility] CATH;  Service: Cardiology   ??? PR INSJ/RPLCMT  PERM DFB W/TRNSVNS LDS 1/DUAL CHMBR N/A 05/04/2017    Procedure: ICD Implant System (Single/Dual);  Surgeon: Eldred Manges, MD;  Location: [facility] CATH;  Service: Cardiology   ??? PR NEGATIVE PRESSURE WOUND THERAPY DME >50 SQ CM Left 03/25/2017    Procedure: Neg Press Wound Tx (Vac Assist) Incl Topicals, Per Session, Tsa Greater Than/= 50 Cm Squared;  Surgeon: Alonna Buckler Ikonomidis, MD;  Location: MAIN OR [facility];  Service: Cardiac Surgery   ??? PR PRQ TRLUML CORONARY STENT W/ANGIO ONE ART/BRNCH N/A 03/12/2018    Procedure: Percutaneous Coronary Intervention;  Surgeon: Marlaine Hind, MD;  Location: [facility] CATH;  Service: Cardiology   ??? PR REBL VES DIRECT,LOW EXTREM Left 03/19/2017    Procedure: Repr Bld Vessel Direct; Lower Extrem;  Surgeon: Boykin Reaper, MD;  Location: MAIN OR [facility];  Service: Vascular   ??? PR REMV ART CLOT ILIAC-POP,LEG INCIS Left 03/25/2017    Procedure: EMBOLECTOMY OR THROMBECTOMY, WITH OR WITHOUT CATHETER; FEMOROPOPLITEAL, AORTOILIAC ARTERY, BY LEG INCISION;  Surgeon: Alonna Buckler Ikonomidis, MD;  Location: MAIN OR [facility];  Service: Cardiac Surgery   ??? PR UPPER GI ENDOSCOPY,BIOPSY N/A 02/28/2016    Procedure: UGI ENDOSCOPY; WITH BIOPSY, SINGLE OR MULTIPLE;  Surgeon: Liane Comber, MD;  Location: [facility];  Service: Gastroenterology   ??? PR UPPER GI ENDOSCOPY,DIAGNOSIS N/A 05/07/2016    Procedure: UGI ENDO, INCLUDE ESOPHAGUS, STOMACH, & DUODENUM &/OR JEJUNUM; DX W/WO COLLECTION SPECIMN, BY BRUSH OR WASH;  Surgeon: Modena Nunnery, MD;  Location: [facility];  Service: Gastroenterology       Allergies:  Losartan and Morphine    Medications:   Prior to Admission medications    Medication Sig Start Date End Date Taking? Authorizing Provider   acetaminophen (TYLENOL) 500 MG tablet TAKE 2 TABLETS (1 000MG ) BY MOUTH EVERY 8 HOURS AS NEEDED FOR PAIN 06/22/18 06/22/19  Julieanne Cotton, PA   albuterol HFA 90 mcg/actuation inhaler Inhale 2 puffs into the lungs every 6 (six) hours as needed for wheezing or shortness of breath. 04/29/18      alirocumab (PRALUENT) 150 mg/mL subcutaneous injection Inject 1 mL (150 mg total) under the skin every fourteen (14) days. 06/24/18   Lyda Kalata Max, CPP   allopurinoL (ZYLOPRIM) 100 MG tablet Take 0.5 tablets (50 mg total) by mouth every other day. 03/18/18   Julieanne Cotton, PA   amiodarone (PACERONE) 200 MG tablet TAKE 1 TABLET BY MOUTH ONCE DAILY 06/10/18 06/10/19  Roma Schanz, MD   arm brace Misc 1 each by Miscellaneous route daily. 06/22/18   Napoleon Form, MD   atorvastatin (LIPITOR) 80 MG tablet Take 1 tablet (80 mg total) by mouth daily. 03/18/18 03/18/19  Julieanne Cotton, PA   blood sugar diagnostic Strp USE TO CHECK BLOOD SUGAR 4 TIMES DAILY (BEFORE MEALS AND NIGHTLY) 06/04/18 06/04/19  Lowry Bowl, MD   blood-glucose meter kit Use to check blood sugars, #1 meter, #100 strips. Freestyle glucose meter. ICD-9 250.00 06/27/16   Julieanne Cotton, PA   blood-glucose sensor (FREESTYLE NAVIGATOR GLUC SENS) Hardie Pulley Frequency:BID   Dosage:0.0     Instructions:  Note:Dose: 1 09/04/10   Historical Provider, MD   bumetanide (BUMEX) 2 MG tablet TAKE 1 TABLET BY MOUTH ONCE DAILY 03/18/18 03/18/19  Julieanne Cotton, PA   carvediloL (COREG) 25 MG tablet TAKE 1 TABLET BY MOUTH WITH THE MORNING MEAL AND TAKE 2 TABLETS WITH THE EVENING MEAL 03/18/18 03/18/19  Julieanne Cotton, PA  cholecalciferol, vitamin D3, 5,000 unit capsule Take 1 capsule (5,000 Units total) by mouth daily. 05/23/18 05/23/19  Rosette Reveal, MD   diclofenac sodium (VOLTAREN) 1 % gel Apply 2 g topically Three (3) times a day as needed for pain.    Historical Provider, MD   empty container Misc Use as directed to dispose of injectable medications 05/03/18   Lyda Kalata Max, CPP   ezetimibe (ZETIA) 10 mg tablet TAKE 1 TABLET BY MOUTH ONCE DAILY 03/18/18 03/18/19  Julieanne Cotton, PA   FLUoxetine (PROZAC) 20 MG capsule Take 3 capsules (60 mg total) by mouth nightly. 06/04/18   Roma Schanz, MD   gabapentin (NEURONTIN) 300 MG capsule Take 1 capsule (300 mg total) by mouth two (2) times a day. 06/22/18 07/22/18  Napoleon Form, MD   insulin ASPART (NOVOLOG FLEXPEN U-100 INSULIN) 100 unit/mL (3 mL) injection pen Inject 0.15 mL (15 Units total) under the skin Three (3) times a day with a meal. 03/18/18   Julieanne Cotton, PA   insulin detemir U-100 (LEVEMIR) 100 unit/mL (3 mL) injection pen Inject 0.4 mL (40 Units total) under the skin nightly. 06/22/18   Roma Schanz, MD   insulin syringe-needle U-100 0.3 mL 31 gauge x 5/16 Syrg Use to inject insulin twice daily with meals 05/23/18   Rosette Reveal, MD   isosorbide mononitrate (IMDUR) 30 MG 24 hr tablet Take 1 tablet (30 mg total) by mouth daily. 03/18/18   Julieanne Cotton, PA   lancets 30 gauge Misc USE TO CHECK BLOOD SUGAR THREE TIMES DAILY WITH MEALS 06/16/17 08/02/18  Roma Schanz, MD   melatonin 3 mg cap Take 9 mg by mouth nightly.    Historical Provider, MD   nitroglycerin (NITROSTAT) 0.4 MG SL tablet Place 1 tablet (0.4 mg total) under the tongue every five (5) minutes as needed for chest pain. 03/18/18 03/18/19  Julieanne Cotton, PA   omeprazole (PRILOSEC) 20 MG capsule Take 1 capsule (20 mg total) by mouth daily. 05/28/18 05/28/19  Lolita Lenz, MD   oxyCODONE-acetaminophen (PERCOCET) 5-325 mg per tablet Take 1 tablet by mouth every eight (8) hours as needed. 06/09/18      polyethylene glycol (MIRALAX) 17 gram packet Mix 1 packet in 4 to 8 oz of water, juice, soda, coffee or tea and drink Two (2) times a day for 7 days. 06/03/18 06/11/18  Lowry Bowl, MD   predniSONE (DELTASONE) 10 MG tablet Take 3 tablets (30 mg total) by mouth daily for 4 days. 06/04/18 06/08/18  Lowry Bowl, MD   QUEtiapine (SEROQUEL) 50 MG tablet Take 1 tablet (50 mg total) by mouth two (2) times a day as needed (headache). 03/18/18   Julieanne Cotton, PA   rivaroxaban (XARELTO) 15 mg Tab Take 1 tablet (15 mg total) by mouth daily with evening meal. 03/18/18 03/18/19  Julieanne Cotton, PA   semaglutide 0.25 mg or 0.5 mg(2 mg/1.5 mL) PnIj Inject 0.25 mg under the skin every seven (7) days. For 4 weeks then increase to 0.5mg  every 7 days 04/23/18   Roma Schanz, MD   sevelamer (RENVELA) 800 mg tablet TAKE 2 TABLETS (1600MG ) BY MOUTH THREE TIMES DAILY WITH MEALS 03/18/18 03/18/19  Julieanne Cotton, PA   ticagrelor (BRILINTA) 90 mg Tab TAKE 1 TABLET BY MOUTH TWICE DAILY 06/10/18 06/10/19  Roma Schanz, MD   traMADol Janean Sark) 50 mg tablet Take 1 tablet (50 mg total) by mouth  every six (6) hours as needed. 12/20/17      UNIFINE PENTIPS 31 gauge x 5/16 Ndle USE WITH INSULIN AND VICTOZA 5 TIMES DAILY 06/10/18 06/10/19  Roma Schanz, MD         Social History:  Tobacco use: reports that he has never smoked. His smokeless tobacco use includes chew.  Alcohol use:   reports no history of alcohol use.  Drug use:  reports no history of drug use.  Living situation: the patient lives with their spouse.    Family History:  Family History   Problem Relation Age of Onset   ??? Diabetes Mother    ??? Hyperlipidemia Mother    ??? Heart disease Mother    ??? Basal cell carcinoma Mother    ??? Blindness Mother         diabeties   ??? Obesity Mother    ??? Hyperlipidemia Father    ??? Heart disease Father    ??? Lung disease Father    ??? Heart disease Half-Sister    ??? Kidney disease Half-Sister    ??? Gallbladder disease Half-Brother    ??? Obesity Half-Brother    ??? No Known Problems Daughter    ??? No Known Problems Son    ??? Obesity Daughter    ??? Melanoma Neg Hx    ??? Squamous cell carcinoma Neg Hx    ??? Macular degeneration Neg Hx    ??? Glaucoma Neg Hx        Objective:     Temp:  [36.9 ??C-37 ??C] 37 ??C  Heart Rate:  [84-104] 94  SpO2 Pulse:  [86-93] 93  Resp:  [20-22] 22  BP: (99-143)/(62-97) 127/70  MAP (mmHg):  [84-98] 91  SpO2:  [94 %-100 %] 94 %  BMI (Calculated):  [42.86] 42.86  Wt Readings from Last 3 Encounters:   07/02/18 (!) 151.2 kg (333 lb 4.8 oz)   06/22/18 (!) 154.7 kg (341 lb 0.8 oz)   05/31/18 (!) 153.2 kg (337 lb 11.9 oz)       Physical Exam:  General: Obese. Sitting up at side of bed. Alert, NAD  HEENT: Sclera anicteric. Oropharyx without exudate or erythema.  Heart: normal rate, regular rhythm, no murmurs.  No appreciable JVP but difficult to tell w/ body habitus.  Lungs: CTAB w/o wheezes or crackles, no increased work of breathing  Abdomen: Obese, soft, non-tender, non distended. Normal bowel sounds  Extremities: No LE edema. Legs are warm  Neuro: Alert and oriented. CNs grossly intact and symmetric. 5/5 strength in b/l upper and lower extremities. Sensation intact throughout.    Labs/Studies:  Labs and Studies from the last 24hrs per EMR and Reviewed

## 2018-07-03 NOTE — Unmapped (Addendum)
Home Health Referral:    [facility]:[phone]     Resumption of Home Health services (Nursing, Physical Therapy, and Occupational Therapy) will occur within 48 hours of discharge

## 2018-07-03 NOTE — Unmapped (Signed)
OCCUPATIONAL THERAPY  Evaluation (07/03/18 0951)    Patient Name:  Jeremy Oliver       Medical Record Number: [id number]   Date of Birth: 06/21/1970  Sex: Male          OT Treatment Diagnosis:  Decreased ADLs, functional mobility, activity tolerance     Assessment  Problem List: Impaired balance;Decreased endurance;Decreased mobility;Fall Risk;Impaired ADLs  Assessment: Jeremy Oliver is a 48 y.o. male with extensive history of CAD s/p multiple PCIs, hx of VT after STEMI in 2018 s/p ICD, HF  (most recent EF 40-45% 05/2018), paroxysmal A. fib on Xarelto, HTN, HLD, T2DM, morbid obesity, CKD who presented to [facility] from [facility] ED for evaluation of weakness/fatigue, N/V, chest pain. At baseline, patient was independent in ADLs, IADLs, and functional mobility. Patient now presents with an AMPAC score of 19/24 indicating a 42.80% impairment in self care, a decrease in balance, functional mobility, activity tolerance, and deficits in all ADLs. Patient would benefit from skilled OT services 3-4 times per week while at [facility], recommend continued skilled OT services 5 times per week at a low intensity upon discharge. Based on pt's occupational profile, assessment review, level of clinical decision making involved, and intervention plan, this evaluation is considered to be a moderate complexity case.   Today's Interventions: AMPAC 19/24, OT evaluation, bed mobility, functional transfers, lower body dressing, sitting tolerance and standing tolerance 1 minute with CGA + RW, education on role of OT, POC, safety, importance of RN assist with OOB mobility.     Activity Tolerance During Today's Session  Patient limited by fatigue;Other(Limited by dizziness )    Plan  Planned Frequency of Treatment:  1-2x per day for: 3-4x week       Planned Interventions:  Adaptive equipment;ADL retraining;Balance activities;Bed mobility;Compensatory tech. training;Conservation;Education - Patient;Education - Family / caregiver;Endurance activities;Functional mobility;Home exercise program;Neuromuscular re-education;Postular / Proximal stability;Positioning;Range of motion;Safety education;Therapeutic exercise;Transfer training;UE Strength / coordination exercise    Post-Discharge Occupational Therapy Recommendations:  OT Post Acute Discharge Recommendations: 5x weekly;Low intensity   OT DME Recommendations: Defer to post acute    GOALS:   Patient and Family Goals: Get stronger.    Long Term Goal #1: Pt will be independent/mod I with basic ADL tasks in 4 weeks        Short Term:  Pt will perform LB dressing with setup   Time Frame : 1 week  Pt will perform toilet/commode transfer with supervision + LRAD   Time Frame : 1 week  Pt will tolerate standing for 2+ minutes with supervision to complete grooming tasks   Time Frame : 1 week    Prognosis:  Good  Positive Indicators:  Caregiver support   Barriers to Discharge: Inability to safely perform ADLS;Inaccessible home environment    Subjective  Current Status Pt left supine in bed with all needs met, call bell within reach, bed alarm activated, RN aware  Prior Functional Status PTA pt was independent in basic ADLs, however, required assist to don his L LE AFO. Pt's spouse and daughter assist with all IADLs. Per patient he is able to ambulate short distances using a walker when hes feeling well, but uses a wheelchair when fatigued. Patient has been sleeping on a mattress on the floor and reports difficulty getting up, requring physical assist from his spouse and daughter. Pt does not endorse any recent falls.             Patient / Caregiver reports: It's been really hard  since I was on life support.     Past Medical History:   Diagnosis Date   ??? Angina at rest (CMS-[name])    ??? Arthritis    ??? Asthma    ??? Atrial fibrillation and flutter (CMS-[name]) 06/08/14   ??? BMI 40.0-44.9, adult (CMS-[name]) 10/29/2015   ??? Chronic anticoagulation 02/17/2018   ??? Chronic kidney disease, stage 3 (CMS-[name])    ??? Chronic pain syndrome 04/11/2014    ~Use daily lyrica as recommended ~Tramadol as needed for pain ~Fluoxetine daily as recommended ~Daily exercise ~Eat a healthy diet ~Good control of your blood sugars can help    ??? Coronary artery disease    ??? Depression    ??? Diabetes mellitus (CMS-[name])    ??? Eye trauma 1998    glass in both eyes and removed   ??? GERD (gastroesophageal reflux disease)    ??? Gout    ??? Homeless    ??? Hyperlipidemia    ??? Hypertension    ??? Illiteracy and low-level literacy    ??? Microscopic hematuria    ??? Migraine    ??? Nephrolithiasis    ??? 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 Nonalcoholic fatty liver disease    ??? NSTEMI (non-ST elevated myocardial infarction) (CMS-[name]) 01/13/2017   ??? Obesity    ??? Obstructive sleep apnea 2009   ??? Panic attacks    ??? 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 Seizure-like activity (CMS-[name])     ~Monitor for symptoms and report them to your primary care provider if they occur    ??? Systolic CHF, chronic (CMS-[name])     EF 40-45% 2017   ??? TIA (transient ischemic attack)     Social History     Tobacco Use   ??? Smoking status: Never Smoker   ??? Smokeless tobacco: Current User     Types: Chew   ??? Tobacco comment: Pt dips 1 can a week, Pt not interested in quitting    Substance Use Topics   ??? Alcohol use: Never     Alcohol/week: 0.0 standard drinks     Frequency: Never     Comment: rare use: couple times a year, small amount      Past Surgical History:   Procedure Laterality Date   ??? CARDIAC CATHETERIZATION  03/08/2007   ??? CYSTOURETHROSCOPY  12/31/2009     Microscopic hematuria   ??? KNEE SURGERY Right 09/23/2006    arthroscopic knee surgery medial and lateral meniscus tears as well as crystal disease   ??? PR CATH PLACE/CORON ANGIO, IMG SUPER/INTERP,R&L HRT CATH, L HRT VENTRIC N/A 02/13/2017    Procedure: Left/Right Heart Catheterization;  Surgeon: Marlaine Hind, MD;  Location: [facility] CATH;  Service: Cardiology   ??? PR CATH PLACE/CORON ANGIO, IMG SUPER/INTERP,W LEFT HEART VENTRICULOGRAPHY N/A 03/17/2017    Procedure: Left Heart Catheterization W Intervention;  Surgeon: Marlaine Hind, MD;  Location: [facility] CATH;  Service: Cardiology   ??? PR CATH PLACE/CORON ANGIO, IMG SUPER/INTERP,W LEFT HEART VENTRICULOGRAPHY N/A 05/12/2018    Procedure: CATH LEFT HEART CATHETERIZATION W INTERVENTION;  Surgeon:  Dorathy Kinsman, MD;  Location: [facility] CATH;  Service: Cardiology   ??? PR ECMO/ECLS INITIATION VENO-ARTERIAL N/A 03/19/2017    Procedure: ECMO/ECLS; INITIATION, VENO-ARTERIAL;  Surgeon: Lennie Odor, MD;  Location: [facility]IN OR [facility];  Service: Cardiac Surgery   ??? PR ECMO/ECLS RMVL PRPH CANNULA OPEN 6 YRS & OLDER Left 03/25/2017    Procedure: ECMO / ECLS PROVIDED BY PHYSICIAN; REMOVAL OF PERIPHERAL (ARTERIAL AND/OR VENOUS) CANNULA(E), OPEN, 6 YEARS AND OLDER;  Surgeon: Alonna Buckler Ikonomidis, MD;  Location: MAIN OR [facility];  Service: Cardiac Surgery   ??? PR EPHYS EVAL W/ ABLATION SUPRAVENT ARRHYTHMIA N/A 07/19/2015    Catheter ablation of cavotricuspid isthmus for atrial flutter [facility])   ??? PR INSER HART PACER XVENOUS ATRIAL N/A 05/30/2015    [employer] dual-chamber pacemaker implant (E.Chung)   ??? PR INSERT/PLACE FLOW DIRECT CATH N/A 03/17/2017    Procedure: Insert Leave In [place];  Surgeon: Marlaine Hind, MD;  Location: [facility] CATH;  Service: Cardiology   ??? PR INSJ/RPLCMT PERM DFB W/TRNSVNS LDS 1/DUAL CHMBR N/A 05/04/2017    Procedure: ICD Implant System (Single/Dual);  Surgeon: Eldred Manges, MD;  Location: [facility] CATH;  Service: Cardiology   ??? PR NEGATIVE PRESSURE WOUND THERAPY DME >50 SQ CM Left 03/25/2017    Procedure: Neg Press Wound Tx (Vac Assist) Incl Topicals, Per Session, Tsa Greater Than/= 50 Cm Squared;  Surgeon: Alonna Buckler Ikonomidis, MD;  Location: MAIN OR [facility];  Service: Cardiac Surgery   ??? PR PRQ TRLUML CORONARY STENT W/ANGIO ONE ART/BRNCH N/A 03/12/2018    Procedure: Percutaneous Coronary Intervention;  Surgeon: Marlaine Hind, MD;  Location: [facility] CATH;  Service: Cardiology   ??? PR REBL VES DIRECT,LOW EXTREM Left 03/19/2017    Procedure: Repr Bld Vessel Direct; Lower Extrem;  Surgeon: Boykin Reaper, MD;  Location: MAIN OR [facility];  Service: Vascular   ??? PR REMV ART CLOT ILIAC-POP,LEG INCIS Left 03/25/2017    Procedure: EMBOLECTOMY OR THROMBECTOMY, WITH OR WITHOUT CATHETER; FEMOROPOPLITEAL, AORTOILIAC ARTERY, BY LEG INCISION;  Surgeon: Alonna Buckler Ikonomidis, MD;  Location: MAIN OR [facility];  Service: Cardiac Surgery   ??? PR UPPER GI ENDOSCOPY,BIOPSY N/A 02/28/2016    Procedure: UGI ENDOSCOPY; WITH BIOPSY, SINGLE OR MULTIPLE;  Surgeon: Liane Comber, MD;  [facility]: [facility];  Service: Gastroenterology   ??? PR UPPER GI ENDOSCOPY,DIAGNOSIS N/A 05/07/2016    Procedure: UGI ENDO, INCLUDE ESOPHAGUS, STOMACH, & DUODENUM &/OR JEJUNUM; DX W/WO COLLECTION SPECIMN, BY BRUSH OR WASH;  Surgeon: Modena Nunnery, MD;  Location: [facility];  Service: Gastroenterology    Family History   Problem Relation Age of Onset   ??? Diabetes Mother    ??? Hyperlipidemia Mother    ??? Heart disease Mother    ??? Basal cell carcinoma Mother    ??? Blindness Mother         diabeties   ??? Obesity Mother    ??? Hyperlipidemia Father    ??? Heart disease Father    ??? Lung disease Father    ??? Heart disease Half-Sister    ??? Kidney disease Half-Sister    ??? Gallbladder disease Half-Brother    ??? Obesity Half-Brother    ??? No Known Problems Daughter    ??? No Known Problems Son    ??? Obesity Daughter    ??? Melanoma Neg Hx    ??? Squamous cell carcinoma Neg Hx    ??? Macular degeneration Neg Hx    ??? Glaucoma Neg Hx  Losartan and Morphine     Objective Findings  Precautions / Restrictions  Falls precautions    Weight Bearing  Non-applicable    Required Braces or Orthoses  LLE AFO - Ankle-foot orthoses(At baseline )    Communication Preference  Verbal    Pain  No c/o pain    Equipment / Environment  Vascular access (PIV, TLC, Port-a-cath, PICC);Telemetry    Living Situation  Living Environment: House  Lives With: Spouse;Daughter  Home Living: Two level home;Bed/bath upstairs;Stairs to alternate level with rails;Walk-in shower;Standard height toilet;Stairs to enter with rails;Able to Live on main level with bedroom/bathroom  Rail placement (outside): Bilateral rails  Rail placement (inside): Bilateral rails  Number of Stairs: 10(4 STE)     Cognition   Orientation Level:  Oriented x 4   Arousal/Alertness:  Appropriate responses to stimuli   Attention Span:  Appears intact   Memory:  Appears intact   Following Commands:  Follows all commands and directions without difficulty   Safety Judgment:  Decreased awareness of need for assistance;Decreased awareness of need for safety   Awareness of Errors:  Decreased awareness of need for assistance;Decreased awareness of need for safety   Problem Solving:  Able to problem solve independently    Vision / Perception    Hearing: WFL   Perception: WFL       Hand Function  Hand Dominance: R  WFL    Skin Inspection  Exposed skin intact     ROM / Strength/Coordination  UE ROM/ Strength/ Coordination: B UE ROM, strength, coordination WFL     Balance:  Sitting balance EOB with supervision, standing balance with CGA + RW    Functional Mobility  Transfer Assistance Needed: Yes  Transfers - Needs Assistance: Min assist;Verbal assist/cues required(STS with bed elevated + RW, unable to ambulate 2/2 fatigue )  Bed Mobility Assistance Needed: Yes  Bed Mobility - Needs Assistance: Min assist(Supine to sit, sit to supine with HOB elevated)      ADLs  ADLs: Needs assistance with ADLs  ADLs - Needs Assistance: Feeding;Grooming;Bathing;Toileting;UB dressing;LB dressing  Feeding - Needs Assistance: (Independent )  Grooming - Needs Assistance: (Setup)  Bathing - Needs Assistance: Min assist(Seated)  Toileting - Needs Assistance: Min assist(+ RW for transfer to commode)  UB Dressing - Needs Assistance: (Setup while seated)  LB Dressing - Needs Assistance: Min assist(Pants )      Vitals / Orthostatics  At Rest: VSS  With Activity: VSS      Medical Staff Made Aware: RN      Occupational Therapy Session Duration  OT Individual - Duration: 25         I attest that I have reviewed the above information.  Signed: Barb Merino, OT  Filed 07/03/2018

## 2018-07-03 NOTE — Unmapped (Signed)
Problem: Adult Inpatient Plan of Care  Goal: Plan of Care Review  Outcome: Progressing  Goal: Patient-Specific Goal (Individualization)  Outcome: Progressing  Goal: Absence of Hospital-Acquired Illness or Injury  Outcome: Progressing  Goal: Optimal Comfort and Wellbeing  Outcome: Progressing  Goal: Readiness for Transition of Care  Outcome: Progressing  Goal: Rounds/Family Conference  Outcome: Progressing     Problem: Fall Injury Risk  Goal: Absence of Fall and Fall-Related Injury  Outcome: Progressing     Problem: Self-Care Deficit  Goal: Improved Ability to Complete Activities of Daily Living  Outcome: Progressing     Problem: Functional Ability Impaired (Heart Failure)  Goal: Optimal Functional Ability  Outcome: Progressing     Problem: Cardiac Output Decreased (Heart Failure)  Goal: Optimal Cardiac Output  Outcome: Progressing     Problem: Sleep Disordered Breathing (Heart Failure)  Goal: Effective Breathing Pattern During Sleep  Outcome: Progressing     Problem: Diabetes Comorbidity  Goal: Blood Glucose Level Within Desired Range  Outcome: Progressing     Problem: Heart Failure Comorbidity  Goal: Maintenance of Heart Failure Symptom Control  Outcome: Progressing     Problem: Hypertension Comorbidity  Goal: Blood Pressure in Desired Range  Outcome: Progressing     Problem: Obstructive Sleep Apnea Risk or Actual (Comorbidity Management)  Goal: Unobstructed Breathing During Sleep  Outcome: Progressing     Problem: Pain Chronic (Persistent) (Comorbidity Management)  Goal: Acceptable Pain Control and Functional Ability  Outcome: Progressing   AXOX4, VSS, V-paced, pt was transferred to the unit from OSH, Denied chest pain/chest tightness. Pt tolerated all meds well. Heparin drip ongoing. Falls precautions in place.

## 2018-07-05 MED ORDER — INSULIN DETEMIR (U-100) 100 UNIT/ML (3 ML) SUBCUTANEOUS PEN
Freq: Every evening | SUBCUTANEOUS | 3 refills | 37 days | Status: CP
Start: 2018-07-05 — End: ?
  Filled 2018-07-05: qty 15, 37d supply, fill #0

## 2018-07-05 MED FILL — LEVEMIR FLEXTOUCH U-100 INSULIN 100 UNIT/ML (3 ML) SUBCUTANEOUS PEN: 37 days supply | Qty: 15 | Fill #0 | Status: AC

## 2018-07-05 MED FILL — OZEMPIC 0.25 MG OR 0.5 MG (2 MG/1.5 ML) SUBCUTANEOUS PEN INJECTOR: SUBCUTANEOUS | 28 days supply | Qty: 1.5 | Fill #2

## 2018-07-05 MED FILL — OZEMPIC 0.25 MG OR 0.5 MG (2 MG/1.5 ML) SUBCUTANEOUS PEN INJECTOR: 28 days supply | Qty: 2 | Fill #2 | Status: AC

## 2018-07-05 NOTE — Unmapped (Signed)
Patient discharged from the hospital on 07/03/2018.  Placed orders for repeat BMP to be done around 07/08/2018 in the morning. Please call him to come by to get these labs drawn. OK to be drawn at [facility].  Will request a scheduled phone visit with him on Thursday 07/08/18 to discuss labs

## 2018-07-05 NOTE — Unmapped (Addendum)
The [facility] Personal Health Advocate Pool received a case management referral message for this Value Care Priority patient. Message forwarded via in-basket message to: [facility] Personal Health Advocate Case Manager Michaelene Song, RN for follow up with patient.          ----- Message from Shirlee Limerick, RN sent at 07/02/2018  5:02 PM EDT -----  Regarding: Value Care Patient Notification  (Directions: Please send to [facility])    I recently cared for one of the Value Care patients. Patient needs discharge follow-up.     If you have any questions please contact me at [phone], Lear Ng RN.

## 2018-07-05 NOTE — Unmapped (Signed)
Patient was discharged from hospital on 07/03/18. The hospital follow-up covering physician reviewed their record and determined patient should receive face to face hospital follow up. HFU order placed.

## 2018-07-05 NOTE — Unmapped (Signed)
Jeremy Oliver with HH said [name] reported a fall on Sunday.  He tripped over a dong and has pain in leg & R wrist.  They have not seen him but wanted to let you know.  Her # if ?'s is [phone]

## 2018-07-06 NOTE — Unmapped (Signed)
CCM Intake call    PCP Action:f/u pain mngt     CCM f/u Levemir insulin rx refill        [name] called in this morning to discuss pt's recent ED visit and having fluid build up and has appt scheduled w/ Dr. Andrey Farmer 07/22/18. Denies need to be seen earlier by cardiology. (currently scheduled a phone visit w/ PCP this week)    Pt requesting pain medication for new r. Knee pain. Pt's biggest complaint is bilateral leg pain. Left foot drop. R. Knee pain. States, it takes me an hour to get from upstairs at my house to my car in driveway     Pt still driving wife [name], b/c wife lost her license.    Pt oscillates between thinking pain is a gout flare up and pain from a fall.   Does take allopurinol qod.   Not sleeping, does take melatonin 12 mg po @hs .  [name] states [facility] does not give them enough time when planning visits.   CCM aske [name] to attempt to schedule as best they can ahead of time.        [name] asking regarding Ultram rx and Meloxicam rxs for pain for pt.  CCM encourages pt to discuss pain w/ PCP @ phone visit.      PCP to have phone visit this week.

## 2018-07-08 ENCOUNTER — Ambulatory Visit: Admit: 2018-07-08 | Discharge: 2018-07-09 | Payer: MEDICARE

## 2018-07-08 ENCOUNTER — Emergency Department: Payer: Medicare Other

## 2018-07-08 ENCOUNTER — Observation Stay
Admission: EM | Admit: 2018-07-08 | Discharge: 2018-07-09 | Disposition: A | Discharge status: Home / Self Care | Payer: Medicare Other | Attending: Internal Medicine | Admitting: Internal Medicine

## 2018-07-08 ENCOUNTER — Encounter: Payer: Self-pay | Admitting: Emergency Medicine

## 2018-07-08 ENCOUNTER — Other Ambulatory Visit: Payer: Self-pay

## 2018-07-08 DIAGNOSIS — Z791 Long term (current) use of non-steroidal anti-inflammatories (NSAID): Secondary | ICD-10-CM | POA: Insufficient documentation

## 2018-07-08 DIAGNOSIS — I252 Old myocardial infarction: Secondary | ICD-10-CM | POA: Insufficient documentation

## 2018-07-08 DIAGNOSIS — I130 Hypertensive heart and chronic kidney disease with heart failure and stage 1 through stage 4 chronic kidney disease, or unspecified chronic kidney disease: Secondary | ICD-10-CM | POA: Diagnosis not present

## 2018-07-08 DIAGNOSIS — I480 Paroxysmal atrial fibrillation: Secondary | ICD-10-CM | POA: Insufficient documentation

## 2018-07-08 DIAGNOSIS — Z955 Presence of coronary angioplasty implant and graft: Secondary | ICD-10-CM | POA: Diagnosis not present

## 2018-07-08 DIAGNOSIS — R55 Syncope and collapse: Secondary | ICD-10-CM | POA: Diagnosis present

## 2018-07-08 DIAGNOSIS — E1122 Type 2 diabetes mellitus with diabetic chronic kidney disease: Secondary | ICD-10-CM | POA: Diagnosis not present

## 2018-07-08 DIAGNOSIS — Z950 Presence of cardiac pacemaker: Secondary | ICD-10-CM | POA: Insufficient documentation

## 2018-07-08 DIAGNOSIS — I5022 Chronic systolic (congestive) heart failure: Secondary | ICD-10-CM | POA: Diagnosis not present

## 2018-07-08 DIAGNOSIS — R1084 Generalized abdominal pain: Secondary | ICD-10-CM | POA: Diagnosis present

## 2018-07-08 DIAGNOSIS — N184 Chronic kidney disease, stage 4 (severe): Secondary | ICD-10-CM | POA: Insufficient documentation

## 2018-07-08 DIAGNOSIS — E785 Hyperlipidemia, unspecified: Secondary | ICD-10-CM | POA: Diagnosis not present

## 2018-07-08 DIAGNOSIS — Z79899 Other long term (current) drug therapy: Secondary | ICD-10-CM | POA: Diagnosis not present

## 2018-07-08 DIAGNOSIS — K439 Ventral hernia without obstruction or gangrene: Secondary | ICD-10-CM | POA: Insufficient documentation

## 2018-07-08 DIAGNOSIS — I700 Atherosclerosis of aorta: Secondary | ICD-10-CM | POA: Insufficient documentation

## 2018-07-08 DIAGNOSIS — Z8249 Family history of ischemic heart disease and other diseases of the circulatory system: Secondary | ICD-10-CM | POA: Diagnosis not present

## 2018-07-08 DIAGNOSIS — Z7901 Long term (current) use of anticoagulants: Secondary | ICD-10-CM | POA: Diagnosis not present

## 2018-07-08 DIAGNOSIS — R778 Other specified abnormalities of plasma proteins: Secondary | ICD-10-CM

## 2018-07-08 DIAGNOSIS — I255 Ischemic cardiomyopathy: Secondary | ICD-10-CM | POA: Diagnosis not present

## 2018-07-08 DIAGNOSIS — I708 Atherosclerosis of other arteries: Secondary | ICD-10-CM | POA: Insufficient documentation

## 2018-07-08 DIAGNOSIS — R7989 Other specified abnormal findings of blood chemistry: Secondary | ICD-10-CM

## 2018-07-08 DIAGNOSIS — I2510 Atherosclerotic heart disease of native coronary artery without angina pectoris: Secondary | ICD-10-CM | POA: Insufficient documentation

## 2018-07-08 DIAGNOSIS — Z87891 Personal history of nicotine dependence: Secondary | ICD-10-CM | POA: Diagnosis not present

## 2018-07-08 DIAGNOSIS — Z794 Long term (current) use of insulin: Secondary | ICD-10-CM | POA: Diagnosis not present

## 2018-07-08 DIAGNOSIS — R079 Chest pain, unspecified: Secondary | ICD-10-CM | POA: Diagnosis present

## 2018-07-08 LAB — URINALYSIS, COMPLETE (UACMP) WITH MICROSCOPIC
Bacteria, UA: NONE SEEN
Bilirubin Urine: NEGATIVE
Glucose, UA: 50 mg/dL — AB
Ketones, ur: NEGATIVE mg/dL
Leukocytes,Ua: NEGATIVE
Nitrite: NEGATIVE
Protein, ur: 100 mg/dL — AB
Specific Gravity, Urine: 1.01 (ref 1.005–1.030)
Squamous Epithelial / HPF: NONE SEEN (ref 0–5)
pH: 5 (ref 5.0–8.0)

## 2018-07-08 LAB — COMPREHENSIVE METABOLIC PANEL
ALT: 6 U/L (ref 0–44)
AST: 9 U/L — ABNORMAL LOW (ref 15–41)
Albumin: 2.7 g/dL — ABNORMAL LOW (ref 3.5–5.0)
Alkaline Phosphatase: 81 U/L (ref 38–126)
Anion gap: 10 (ref 5–15)
BUN: 35 mg/dL — ABNORMAL HIGH (ref 6–20)
CO2: 24 mmol/L (ref 22–32)
Calcium: 8.9 mg/dL (ref 8.9–10.3)
Chloride: 104 mmol/L (ref 98–111)
Creatinine, Ser: 3.86 mg/dL — ABNORMAL HIGH (ref 0.61–1.24)
GFR calc Af Amer: 20 mL/min — ABNORMAL LOW (ref >60)
GFR calc non Af Amer: 17 mL/min — ABNORMAL LOW (ref >60)
Glucose, Bld: 119 mg/dL — ABNORMAL HIGH (ref 70–99)
Potassium: 4.3 mmol/L (ref 3.5–5.1)
Sodium: 138 mmol/L (ref 135–145)
Total Bilirubin: 0.3 mg/dL (ref 0.3–1.2)
Total Protein: 7.9 g/dL (ref 6.5–8.1)

## 2018-07-08 LAB — CBC WITH DIFFERENTIAL/PLATELET
Abs Immature Granulocytes: 0.05 10*3/uL (ref 0.00–0.07)
Basophils Absolute: 0.1 10*3/uL (ref 0.0–0.1)
Basophils Relative: 1 %
Eosinophils Absolute: 0.2 10*3/uL (ref 0.0–0.5)
Eosinophils Relative: 1 %
HCT: 31 % — ABNORMAL LOW (ref 39.0–52.0)
Hemoglobin: 9.7 g/dL — ABNORMAL LOW (ref 13.0–17.0)
Immature Granulocytes: 0 %
Lymphocytes Relative: 13 %
Lymphs Abs: 1.6 10*3/uL (ref 0.7–4.0)
MCH: 26.2 pg (ref 26.0–34.0)
MCHC: 31.3 g/dL (ref 30.0–36.0)
MCV: 83.8 fL (ref 80.0–100.0)
Monocytes Absolute: 0.8 10*3/uL (ref 0.1–1.0)
Monocytes Relative: 6 %
Neutro Abs: 9.5 10*3/uL — ABNORMAL HIGH (ref 1.7–7.7)
Neutrophils Relative %: 79 %
Platelets: 420 10*3/uL — ABNORMAL HIGH (ref 150–400)
RBC: 3.7 MIL/uL — ABNORMAL LOW (ref 4.22–5.81)
RDW: 13.9 % (ref 11.5–15.5)
WBC: 12.1 10*3/uL — ABNORMAL HIGH (ref 4.0–10.5)
nRBC: 0 % (ref 0.0–0.2)

## 2018-07-08 LAB — TROPONIN I
Troponin I: 0.09 ng/mL (ref <0.03)
Troponin I: 0.08 ng/mL (ref <0.03)

## 2018-07-08 LAB — LIPASE, BLOOD: Lipase: 40 U/L (ref 11–51)

## 2018-07-08 LAB — GLUCOSE, CAPILLARY: Glucose-Capillary: 137 mg/dL — ABNORMAL HIGH (ref 70–99)

## 2018-07-08 MED ORDER — GABAPENTIN 300 MG CAPSULE
ORAL_CAPSULE | Freq: Two times a day (BID) | ORAL | 0 refills | 0 days | Status: CP
Start: 2018-07-08 — End: 2018-08-25
  Filled 2018-07-19: qty 60, 30d supply, fill #0

## 2018-07-08 MED ORDER — CHOLECALCIFEROL (VITAMIN D3) 125 MCG (5,000 UNIT) CAPSULE
ORAL_CAPSULE | Freq: Every day | ORAL | 0 refills | 0 days
Start: 2018-07-08 — End: 2019-07-08

## 2018-07-08 MED ORDER — BUMETANIDE 2 MG TABLET
ORAL_TABLET | ORAL | 3 refills | 0 days | Status: CP
Start: 2018-07-08 — End: 2018-07-22

## 2018-07-08 MED ORDER — DICLOFENAC 1 % TOPICAL GEL
Freq: Three times a day (TID) | TOPICAL | 0 refills | 0.00000 days | Status: CP | PRN
Start: 2018-07-08 — End: 2018-08-12

## 2018-07-08 MED ORDER — ALBUTEROL SULFATE HFA 90 MCG/ACTUATION AEROSOL INHALER
RESPIRATORY_TRACT | 2 refills | 0 days | Status: CP
Start: 2018-07-08 — End: 2018-10-04
  Filled 2018-07-13: qty 18, 25d supply, fill #0

## 2018-07-08 MED ORDER — ONDANSETRON HCL 4 MG PO TABS: 4.0000 mg | ORAL_TABLET | Freq: Four times a day (QID) | ORAL | Status: DC | PRN

## 2018-07-08 MED ORDER — CARVEDILOL 25 MG PO TABS
25.0000 mg | ORAL_TABLET | Freq: Two times a day (BID) | ORAL | Status: DC
  Administered 2018-07-08 – 2018-07-09 (×2): 25 mg via ORAL
  Filled 2018-07-08 (×2): qty 1

## 2018-07-08 MED ORDER — MORPHINE SULFATE (PF) 2 MG/ML IV SOLN
2.0000 mg | INTRAVENOUS | Status: DC | PRN
  Administered 2018-07-09: 2 mg via INTRAVENOUS
  Filled 2018-07-08: qty 1

## 2018-07-08 MED ORDER — ACETAMINOPHEN 325 MG PO TABS: 650.0000 mg | ORAL_TABLET | Freq: Four times a day (QID) | ORAL | Status: DC | PRN

## 2018-07-08 MED ORDER — SEVELAMER CARBONATE 800 MG PO TABS
1600.0000 mg | ORAL_TABLET | Freq: Three times a day (TID) | ORAL | Status: DC
  Administered 2018-07-09: 09:00:00 1600 mg via ORAL
  Filled 2018-07-08: qty 2

## 2018-07-08 MED ORDER — FLUOXETINE HCL 20 MG PO CAPS
60.0000 mg | ORAL_CAPSULE | Freq: Every day | ORAL | Status: DC
  Administered 2018-07-08: 60 mg via ORAL
  Filled 2018-07-08 (×2): qty 3

## 2018-07-08 MED ORDER — INSULIN DETEMIR 100 UNIT/ML ~~LOC~~ SOLN
40.0000 [IU] | Freq: Every day | SUBCUTANEOUS | Status: DC
  Administered 2018-07-08: 20 [IU] via SUBCUTANEOUS
  Filled 2018-07-08 (×2): qty 0.4

## 2018-07-08 MED ORDER — ISOSORBIDE MONONITRATE ER 30 MG PO TB24
30.0000 mg | ORAL_TABLET | Freq: Every day | ORAL | Status: DC
  Administered 2018-07-09: 30 mg via ORAL
  Filled 2018-07-08: qty 1

## 2018-07-08 MED ORDER — ACETAMINOPHEN 10 MG/ML IV SOLN
1000.0000 mg | INTRAVENOUS | Status: DC
  Filled 2018-07-08: qty 100

## 2018-07-08 MED ORDER — TICAGRELOR 90 MG PO TABS
90.0000 mg | ORAL_TABLET | Freq: Two times a day (BID) | ORAL | Status: DC
  Administered 2018-07-08 – 2018-07-09 (×2): 90 mg via ORAL
  Filled 2018-07-08 (×2): qty 1

## 2018-07-08 MED ORDER — AMIODARONE HCL 200 MG PO TABS
200.0000 mg | ORAL_TABLET | Freq: Every day | ORAL | Status: DC
  Administered 2018-07-09: 200 mg via ORAL
  Filled 2018-07-08: qty 1

## 2018-07-08 MED ORDER — GABAPENTIN 300 MG PO CAPS
300.0000 mg | ORAL_CAPSULE | Freq: Two times a day (BID) | ORAL | Status: DC
  Administered 2018-07-08 – 2018-07-09 (×2): 300 mg via ORAL
  Filled 2018-07-08 (×2): qty 1

## 2018-07-08 MED ORDER — SODIUM CHLORIDE 0.9 % IV SOLN: 1.0000 g | Freq: Once | INTRAVENOUS | Status: DC

## 2018-07-08 MED ORDER — SODIUM CHLORIDE 0.9 % IV SOLN: 250.0000 mL | INTRAVENOUS | Status: DC | PRN

## 2018-07-08 MED ORDER — ATORVASTATIN CALCIUM 20 MG PO TABS
80.0000 mg | ORAL_TABLET | Freq: Every evening | ORAL | Status: DC
  Administered 2018-07-08: 80 mg via ORAL
  Filled 2018-07-08: qty 4

## 2018-07-08 MED ORDER — EZETIMIBE 10 MG PO TABS
10.0000 mg | ORAL_TABLET | Freq: Every day | ORAL | Status: DC
  Administered 2018-07-09: 10 mg via ORAL
  Filled 2018-07-08: qty 1

## 2018-07-08 MED ORDER — ALBUTEROL SULFATE (2.5 MG/3ML) 0.083% IN NEBU: 3.0000 mL | INHALATION_SOLUTION | Freq: Four times a day (QID) | RESPIRATORY_TRACT | Status: DC | PRN

## 2018-07-08 MED ORDER — ACETAMINOPHEN 650 MG RE SUPP: 650.0000 mg | Freq: Four times a day (QID) | RECTAL | Status: DC | PRN

## 2018-07-08 MED ORDER — ONDANSETRON HCL 4 MG/2ML IJ SOLN: 4.0000 mg | Freq: Four times a day (QID) | INTRAMUSCULAR | Status: DC | PRN

## 2018-07-08 MED ORDER — ONDANSETRON HCL 4 MG/2ML IJ SOLN
4.0000 mg | Freq: Once | INTRAMUSCULAR | Status: AC
  Administered 2018-07-08: 4 mg via INTRAVENOUS
  Filled 2018-07-08: qty 2

## 2018-07-08 MED ORDER — SODIUM CHLORIDE 0.9% FLUSH: 3.0000 mL | INTRAVENOUS | Status: DC | PRN

## 2018-07-08 MED ORDER — NITROGLYCERIN 0.4 MG SL SUBL: 0.4000 mg | SUBLINGUAL_TABLET | SUBLINGUAL | Status: DC | PRN

## 2018-07-08 MED ORDER — INSULIN ASPART 100 UNIT/ML ~~LOC~~ SOLN
15.0000 [IU] | Freq: Three times a day (TID) | SUBCUTANEOUS | Status: DC
  Administered 2018-07-09: 15 [IU] via SUBCUTANEOUS
  Filled 2018-07-08: qty 1

## 2018-07-08 MED ORDER — MELATONIN 5 MG PO TABS
10.0000 mg | ORAL_TABLET | Freq: Every day | ORAL | Status: DC
  Administered 2018-07-08: 10 mg via ORAL
  Filled 2018-07-08 (×3): qty 2

## 2018-07-08 MED ORDER — QUETIAPINE FUMARATE 25 MG PO TABS: 50.0000 mg | ORAL_TABLET | Freq: Two times a day (BID) | ORAL | Status: DC | PRN

## 2018-07-08 MED ORDER — MORPHINE SULFATE (PF) 4 MG/ML IV SOLN
4.0000 mg | Freq: Once | INTRAVENOUS | Status: AC
  Administered 2018-07-08: 4 mg via INTRAVENOUS
  Filled 2018-07-08: qty 1

## 2018-07-08 MED ORDER — BUMETANIDE 1 MG PO TABS
2.0000 mg | ORAL_TABLET | Freq: Every day | ORAL | Status: DC
  Administered 2018-07-09: 2 mg via ORAL
  Filled 2018-07-08: qty 2

## 2018-07-08 MED ORDER — RIVAROXABAN 15 MG PO TABS
15.0000 mg | ORAL_TABLET | Freq: Every day | ORAL | Status: DC
  Administered 2018-07-08: 15 mg via ORAL
  Filled 2018-07-08 (×2): qty 1

## 2018-07-08 MED ORDER — OXYCODONE-ACETAMINOPHEN 5-325 MG PO TABS
1.0000 | ORAL_TABLET | Freq: Three times a day (TID) | ORAL | Status: DC | PRN
  Administered 2018-07-09: 1 via ORAL
  Filled 2018-07-08: qty 1

## 2018-07-08 MED ORDER — VITAMIN D3 25 MCG (1000 UNIT) PO TABS
5000.0000 [IU] | ORAL_TABLET | Freq: Every day | ORAL | Status: DC
  Administered 2018-07-09: 5000 [IU] via ORAL
  Filled 2018-07-08: qty 5

## 2018-07-08 MED ORDER — ALLOPURINOL 100 MG PO TABS: 50.0000 mg | ORAL_TABLET | ORAL | Status: DC

## 2018-07-08 MED ORDER — PANTOPRAZOLE SODIUM 40 MG PO TBEC
40.0000 mg | DELAYED_RELEASE_TABLET | Freq: Every day | ORAL | Status: DC
  Administered 2018-07-09: 40 mg via ORAL
  Filled 2018-07-08: qty 1

## 2018-07-08 MED ORDER — SODIUM CHLORIDE 0.9% FLUSH
3.0000 mL | Freq: Two times a day (BID) | INTRAVENOUS | Status: DC
  Administered 2018-07-08 – 2018-07-09 (×2): 3 mL via INTRAVENOUS

## 2018-07-08 NOTE — ED Notes (Addendum)
ED TO INPATIENT HANDOFF REPORT  ED Nurse Name and Phone #:   Gershon Mussel RN   [phone]  S Name/Age/Gender Edwin [name] 48 y.o. male Room/Bed: ED05A/ED05A  Code Status   Code Status: Prior  Home/SNF/Other Home Patient oriented to: self, place, time and situation Is this baseline? Yes   Triage Complete: Triage complete  Chief Complaint syncope  Triage Note Patient reports episode of dizziness and abdominal pain while using restroom this morning. States continued dizziness and nausea with mild abdominal pain.   Allergies Allergies  Allergen Reactions  . Losartan     Hyperkalemia (K>6)  . Morphine And Related Other (See Comments)    Overdose in hospital per patient report    Level of Care/Admitting Diagnosis ED Disposition    ED Disposition Condition [place]: [place] [100120]  Level of Care: Telemetry [5]  Diagnosis: Chest pain [542706]  Admitting Physician: Dustin Flock [[id number]]  Attending Physician: Dustin Flock [[id number]]  PT Class (Do Not Modify): Observation [104]  PT Acc Code (Do Not Modify): Observation [10022]       B Medical/Surgery History Past Medical History:  Diagnosis Date  . CAD (coronary artery disease)   . Chronic systolic heart failure ([place])   . Diabetes mellitus without complication ([place])   . Heart attack ([place])   . Hyperlipidemia   . Hypertension   . Ischemic cardiomyopathy   . Paroxysmal atrial fibrillation [facility])    Past Surgical History:  Procedure Laterality Date  . [employer] pacemaker    . CARDIAC SURGERY    . IRRIGATION AND DEBRIDEMENT KNEE Left 12/17/2017   Procedure: LEFT KNEE DEBRIDEMENT AND SYNOVECTOMY;  Surgeon: Thornton Park, MD;  Location: [facility] ORS;  Service: Orthopedics;  Laterality: Left;  . JOINT REPLACEMENT    . LEFT HEART CATH AND CORONARY ANGIOGRAPHY Left 03/09/2018   Procedure: LEFT HEART CATH AND CORONARY ANGIOGRAPHY;  Surgeon: Nelva Bush, MD;  Location:  [facility];  Service: Cardiovascular;  Laterality: Left;  . PERCUTANEOUS CORONARY STENT INTERVENTION (PCI-S)       A IV Location/Drains/Wounds Patient Lines/Drains/Airways Status   Active Line/Drains/Airways    Name:   Placement date:   Placement time:   Site:   Days:   Peripheral IV 07/08/18 Left Antecubital   07/08/18    1319    Antecubital   less than 1   Incision (Closed) 12/17/17 Knee Left   12/17/17    1313     203   Incision - 2 Ports Other (Comment) Medial Lateral   12/17/17    -     203          Intake/Output Last 24 hours No intake or output data in the 24 hours ending 07/08/18 1859  Labs/Imaging Results for orders placed or performed during the hospital encounter of 07/08/18 (from the past 48 hour(s))  Comprehensive metabolic panel     Status: Abnormal   Collection Time: 07/08/18  1:06 PM  Result Value Ref Range   Sodium 138 135 - 145 mmol/L   Potassium 4.3 3.5 - 5.1 mmol/L   Chloride 104 98 - 111 mmol/L   CO2 24 22 - 32 mmol/L   Glucose, Bld 119 (H) 70 - 99 mg/dL   BUN 35 (H) 6 - 20 mg/dL   Creatinine, Ser 3.86 (H) 0.61 - 1.24 mg/dL   Calcium 8.9 8.9 - 10.3 mg/dL   Total Protein 7.9 6.5 - 8.1 g/dL  Albumin 2.7 (L) 3.5 - 5.0 g/dL   AST 9 (L) 15 - 41 U/L   ALT 6 0 - 44 U/L   Alkaline Phosphatase 81 38 - 126 U/L   Total Bilirubin 0.3 0.3 - 1.2 mg/dL   GFR calc non Af Amer 17 (L) >60 mL/min   GFR calc Af Amer 20 (L) >60 mL/min   Anion gap 10 5 - 15    Comment: Performed at  Hospital, [address] [place], [place] [address]  Lipase, blood     Status: None   Collection Time: 07/08/18  1:06 PM  Result Value Ref Range   Lipase 40 11 - 51 U/L    Comment: Performed at [facility], [address] [place], [place] [address]  Troponin I - Once     Status: Abnormal   Collection Time: 07/08/18  1:06 PM  Result Value Ref Range   Troponin I 0.09 (HH) <0.03 ng/mL    Comment: CRITICAL RESULT CALLED TO, READ BACK BY AND VERIFIED  WITH LAURA CATES @1339  07/08/2018 MU/KLW Performed at [facility], [address] [place], [place] [address]   CBC with Differential     Status: Abnormal   Collection Time: 07/08/18  1:06 PM  Result Value Ref Range   WBC 12.1 (H) 4.0 - 10.5 K/uL   RBC 3.70 (L) 4.22 - 5.81 MIL/uL   Hemoglobin 9.7 (L) 13.0 - 17.0 g/dL   HCT 31.0 (L) 39.0 - 52.0 %   MCV 83.8 80.0 - 100.0 fL   MCH 26.2 26.0 - 34.0 pg   MCHC 31.3 30.0 - 36.0 g/dL   RDW 13.9 11.5 - 15.5 %   Platelets 420 (H) 150 - 400 K/uL   nRBC 0.0 0.0 - 0.2 %   Neutrophils Relative % 79 %   Neutro Abs 9.5 (H) 1.7 - 7.7 K/uL   Lymphocytes Relative 13 %   Lymphs Abs 1.6 0.7 - 4.0 K/uL   Monocytes Relative 6 %   Monocytes Absolute 0.8 0.1 - 1.0 K/uL   Eosinophils Relative 1 %   Eosinophils Absolute 0.2 0.0 - 0.5 K/uL   Basophils Relative 1 %   Basophils Absolute 0.1 0.0 - 0.1 K/uL   Immature Granulocytes 0 %   Abs Immature Granulocytes 0.05 0.00 - 0.07 K/uL    Comment: Performed at [facility], [address] [place], [place] [address]  Urinalysis, Complete w Microscopic     Status: Abnormal   Collection Time: 07/08/18  3:25 PM  Result Value Ref Range   Color, Urine STRAW (A) YELLOW   APPearance CLEAR (A) CLEAR   Specific Gravity, Urine 1.010 1.005 - 1.030   pH 5.0 5.0 - 8.0   Glucose, UA 50 (A) NEGATIVE mg/dL   Hgb urine dipstick SMALL (A) NEGATIVE   Bilirubin Urine NEGATIVE NEGATIVE   Ketones, ur NEGATIVE NEGATIVE mg/dL   Protein, ur 100 (A) NEGATIVE mg/dL   Nitrite NEGATIVE NEGATIVE   Leukocytes,Ua NEGATIVE NEGATIVE   RBC / HPF 0-5 0 - 5 RBC/hpf   WBC, UA 0-5 0 - 5 WBC/hpf   Bacteria, UA NONE SEEN NONE SEEN   Squamous Epithelial / LPF NONE SEEN 0 - 5   Mucus PRESENT    Hyaline Casts, UA PRESENT     Comment: Performed at [facility], [address], [place], [place] [address]   Ct Abdomen Pelvis Wo Contrast  Result Date: 07/08/2018 CLINICAL DATA:  Abdominal pain and nausea EXAM:  CT  ABDOMEN AND PELVIS WITHOUT CONTRAST TECHNIQUE: Multidetector CT imaging of the abdomen and pelvis was performed following the standard protocol without oral or IV contrast. COMPARISON:  None. FINDINGS: Lower chest: Lung bases are clear. Pacemaker lead tips attached to the right atrium and right ventricle. Hepatobiliary: No focal liver lesions are apparent on this noncontrast enhanced study. Gallbladder is mildly contracted without appreciable wall thickening. There is no biliary duct dilatation. Pancreas: There is no pancreatic mass or inflammatory focus. Spleen: No splenic lesions are evident. Adrenals/Urinary Tract: There is an apparent right adrenal myelolipoma measuring 2.5 x 2.2 cm. Left adrenal appears normal. Kidneys bilaterally show no evident mass or hydronephrosis on either side. There is an extrarenal pelvis on each side, an anatomic variant. There is no appreciable renal or ureteral calculus on either side. Urinary bladder is midline. The urinary bladder wall is mildly thickened in a generalized manner. Stomach/Bowel: There is no appreciable bowel wall or mesenteric thickening. There is no evident bowel obstruction. There is no appreciable free air or portal venous air. Vascular/Lymphatic: There are foci of aortic and iliac artery atherosclerosis. No aneurysm evident. No adenopathy is appreciable in the abdomen or pelvis. Reproductive: Prostate and seminal vesicles are normal in size and contour. No pelvic masses evident. Other: The appendix appears normal. There is no abscess or ascites in the abdomen or pelvis. There is a small ventral hernia containing only fat. There is postoperative change in the left inguinal region with scarring and clips present. Musculoskeletal: There are old healed rib fractures on the left. No blastic or lytic bone lesions are evident. No intramuscular lesions are appreciable. IMPRESSION: 1. Thickening of the urinary bladder wall, a finding felt to indicate a degree of  cystitis. No renal or ureteral calculi evident. No hydronephrosis. 2.  Right adrenal myelolipoma, a benign lesion. 3. No bowel obstruction. No abscess in the abdomen or pelvis. Appendix appears normal. 4.  Small ventral hernia containing only fat. 5.  Aortic Atherosclerosis (ICD10-I70.0). Electronically Signed   By: Lowella Grip III M.D.   On: 07/08/2018 14:02   Ct Head Wo Contrast  Result Date: 07/08/2018 CLINICAL DATA:  Altered level of consciousness EXAM: CT HEAD WITHOUT CONTRAST TECHNIQUE: Contiguous axial images were obtained from the base of the skull through the vertex without intravenous contrast. COMPARISON:  May 22, 2018 FINDINGS: Brain: Ventricles are normal in size and configuration. There is borderline frontal atrophy bilaterally for age, stable. There is no appreciable intracranial mass, hemorrhage, extra-axial fluid collection, or midline shift. The brain parenchyma appears unremarkable. There is no demonstrable acute infarct. Vascular: There is no hyperdense vessel. There is no appreciable vascular calcification. Skull: The bony calvarium appears intact. Sinuses/Orbits: There is mild mucosal thickening in several ethmoid air cells. Other visualized paranasal sinuses are clear. Orbits appear symmetric bilaterally. Other: Mastoid air cells are clear. IMPRESSION: Slight frontal atrophy for age. Ventricles normal in size and configuration. Brain parenchyma appears unremarkable. No acute infarct. No mass or hemorrhage. Mild mucosal thickening in several ethmoid air cells. Electronically Signed   By: Lowella Grip III M.D.   On: 07/08/2018 13:57    Pending Labs Unresulted Labs (From admission, onward)    Start     Ordered   07/08/18 1625  Troponin I - Now Then Q6H  Now then every 6 hours,   STAT     07/08/18 1624   07/08/18 1555  Urine Culture  Add-on,   AD     07/08/18 1554   Signed  and Held  CBC  Tomorrow morning,   R     Signed and Held   Signed and Held  Basic metabolic  panel  Tomorrow morning,   R     Signed and Held          Vitals/Pain Today's Vitals   07/08/18 1430 07/08/18 1630 07/08/18 1800 07/08/18 1830  BP: 133/79 116/61 (!) 112/56 (!) 98/55  Pulse: 84 78 84 87  Resp: 16 12 (!) 23 15  SpO2: 97% 99% 98% 95%  Weight:      Height:      PainSc:        Isolation Precautions No active isolations  Medications Medications  acetaminophen (OFIRMEV) IV 1,000 mg (0 mg Intravenous Hold 07/08/18 1350)  morphine 2 MG/ML injection 2 mg (has no administration in time range)  ondansetron (ZOFRAN) injection 4 mg (4 mg Intravenous Given 07/08/18 1345)  morphine 4 MG/ML injection 4 mg (4 mg Intravenous Given 07/08/18 1345)    Mobility walks Low fall risk   Focused Assessments Cardiac Assessment Handoff:  Cardiac Rhythm: Normal sinus rhythm Lab Results  Component Value Date   CKTOTAL 44 (L) 05/10/2018   TROPONINI 0.09 ([place]) 07/08/2018   No results found for: DDIMER Does the Patient currently have chest pain? No     R Recommendations: See Admitting Provider Note  Report given to:  Maricar RN  Additional Notes:

## 2018-07-08 NOTE — Progress Notes (Addendum)
Pt blood sugar was at 137. Pt have a scheduled Levemir 40 units. Talked to Dr. Posey Pronto and ordered to just give 20 units of Levemir. 20 units was adminsitered. Will continue to monitor.  Update 0008: Pt complaining of 10 out 10 pain on right hand. VSS. Pt was given 2 mg/1 ml morphine. After administration of morphine pt complaints of dizziness, involuntary movement and pt went out for 1 seconds. Notify Dr. Posey Pronto, but no new order was place just monitor pt. Will continue to monitor.

## 2018-07-08 NOTE — ED Notes (Addendum)
Patient with syncopal episode in front of the RN. Episode lasted approximately 10 seconds. MD to bedside to assess patient. Patient alert and oriented x4 after episode.

## 2018-07-08 NOTE — ED Triage Notes (Signed)
Patient reports episode of dizziness and abdominal pain while using restroom this morning. States continued dizziness and nausea with mild abdominal pain.

## 2018-07-08 NOTE — H&P (Signed)
[place] [facility] [place] NAME: Edwin [name]    MR#:  [id number]  DATE OF BIRTH:  April 07, 1970  DATE OF ADMISSION:  07/08/2018  PRIMARY CARE PHYSICIAN: Caryl Never, MD   REQUESTING/REFERRING PHYSICIAN: Brenton Grills, MD  CHIEF COMPLAINT:   Chief Complaint  Patient presents with  . Near Syncope    HISTORY OF PRESENT ILLNESS: Edwin [name]  is a 48 y.o. male with a known history of coronary artery disease with 5 stents chronic systolic congestive heart failure, diabetes, hyperlipidemia, hypertension, morbid obesity, paroxysmal atrial fibrillation who presents to the emergency room with complaint of chest pain and syncope.  In reviewing his chart patient has had multiple ER visits with same complaints.  Patient's troponin slightly elevated therefore the ED physician recommends admission to the hospital for observation to trend his troponins.  Patient states that now his symptoms have resolved.  He states that the chest pain lasted briefly.  And now resolved.  This is similar to his previous episodes.  He also had associated shortness of breath there was no radiation of the pain.  PAST MEDICAL HISTORY:   Past Medical History:  Diagnosis Date  . CAD (coronary artery disease)   . Chronic systolic heart failure ([place])   . Diabetes mellitus without complication ([place])   . Heart attack ([place])   . Hyperlipidemia   . Hypertension   . Ischemic cardiomyopathy   . Paroxysmal atrial fibrillation ([place])     PAST SURGICAL HISTORY:  Past Surgical History:  Procedure Laterality Date  . [employer] pacemaker    . CARDIAC SURGERY    . IRRIGATION AND DEBRIDEMENT KNEE Left 12/17/2017   Procedure: LEFT KNEE DEBRIDEMENT AND SYNOVECTOMY;  Surgeon: Thornton Park, MD;  Location: [facility];  Service: Orthopedics;  Laterality: Left;  . JOINT REPLACEMENT    . LEFT HEART CATH AND CORONARY ANGIOGRAPHY Left 03/09/2018   Procedure: LEFT HEART CATH AND CORONARY ANGIOGRAPHY;   Surgeon: Nelva Bush, MD;  Location: [facility];  Service: Cardiovascular;  Laterality: Left;  . PERCUTANEOUS CORONARY STENT INTERVENTION (PCI-S)      SOCIAL HISTORY:  Social History   Tobacco Use  . 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 Smokeless tobacco: Current User    Types: Snuff  Substance Use Topics  . Alcohol use: Never    Frequency: Never    FAMILY HISTORY:  Family History  Problem Relation Age of Onset  . Diabetes Mother   . Peripheral vascular disease Mother   . COPD Father     DRUG ALLERGIES:  Allergies  Allergen Reactions  . Losartan     Hyperkalemia (K>6)  . Morphine And Related Other (See Comments)    Overdose in hospital per patient report    REVIEW OF SYSTEMS:   CONSTITUTIONAL: No fever, fatigue or weakness.  EYES: No blurred or double vision.  EARS, NOSE, AND THROAT: No tinnitus or ear pain.  RESPIRATORY: No cough, shortness of breath, wheezing or hemoptysis.  CARDIOVASCULAR: Positive chest pain, orthopnea, edema.  GASTROINTESTINAL: No nausea, vomiting, diarrhea or abdominal pain.  GENITOURINARY: No dysuria, hematuria.  ENDOCRINE: No polyuria, nocturia,  HEMATOLOGY: No anemia, easy bruising or bleeding SKIN: No rash or lesion. MUSCULOSKELETAL: No joint pain or arthritis.   NEUROLOGIC: No tingling, numbness, weakness.  PSYCHIATRY: No anxiety or depression.   MEDICATIONS AT HOME:  Prior to Admission medications   Medication Sig Start Date End Date Taking? Authorizing Provider  acetaminophen (TYLENOL) 500 MG tablet  TAKE 2 TABLETS (1 000MG ) BY MOUTH EVERY 8 HOURS AS NEEDED FOR PAIN 06/22/18 06/22/19 Yes [provider]  albuterol (PROVENTIL HFA;VENTOLIN HFA) 108 (90 Base) MCG/ACT inhaler Inhale 2 puffs into the lungs every 6 (six) hours as needed for wheezing or shortness of breath. 04/29/18  Yes Schuyler Amor, MD  Alirocumab 150 MG/ML SOAJ Inject 150 mg into the skin. Every 14 days 06/24/18  Yes [provider]   allopurinol (ZYLOPRIM) 100 MG tablet Take 50 mg by mouth every other day. 03/18/18  Yes [provider]  amiodarone (PACERONE) 200 MG tablet Take 200 mg by mouth daily. 09/14/17  Yes [provider]  atorvastatin (LIPITOR) 80 MG tablet Take 80 mg by mouth every evening.  09/14/17  Yes [provider]  BRILINTA 90 MG TABS tablet Take 90 mg by mouth 2 (two) times daily. 09/14/17  Yes [provider]  bumetanide (BUMEX) 2 MG tablet Take 2 mg by mouth daily. 09/14/17  Yes [provider]  carvedilol (COREG) 25 MG tablet Take 25-50 mg by mouth See admin instructions. Take 1 tablet (25MG ) by mouth every morning and 2 tablets (50MG ) by mouth every night 10/16/17  Yes [provider]  cholecalciferol (VITAMIN D) 1000 units tablet Take 5,000 Units by mouth daily.   Yes [provider]  ezetimibe (ZETIA) 10 MG tablet Take 10 mg by mouth daily. 10/14/17  Yes [provider]  FLUoxetine (PROZAC) 20 MG capsule Take 60 mg by mouth at bedtime.  12/04/17  Yes [provider]  gabapentin (NEURONTIN) 300 MG capsule Take 300 mg by mouth 2 (two) times daily. 06/22/18 07/22/18 Yes [provider]  insulin aspart (NOVOLOG) 100 UNIT/ML injection Inject 15 Units into the skin 3 (three) times daily with meals. 12/20/17  Yes Sainani, Belia Heman, MD  insulin detemir (LEVEMIR) 100 UNIT/ML injection Inject 0.4 mLs (40 Units total) into the skin 2 (two) times daily. Patient taking differently: Inject 40 Units into the skin at bedtime.  12/20/17  Yes Henreitta Leber, MD  isosorbide mononitrate (IMDUR) 30 MG 24 hr tablet Take 1 tablet (30 mg total) by mouth daily. 03/12/18  Yes Epifanio Lesches, MD  Melatonin 3 MG CAPS Take 9 mg by mouth at bedtime.   Yes [provider]  nitroGLYCERIN (NITROSTAT) 0.4 MG SL tablet Place 0.4 mg under the tongue every 5 (five) minutes as needed for chest pain.   Yes [provider]  omeprazole  (PRILOSEC) 20 MG capsule Take 20 mg by mouth daily. 05/28/18 05/28/19 Yes [provider]  oxyCODONE-acetaminophen (PERCOCET) 5-325 MG tablet Take 1 tablet by mouth every 8 (eight) hours as needed. 06/09/18  Yes Earleen Newport, MD  OZEMPIC, 0.25 OR 0.5 MG/DOSE, 2 MG/1.5ML SOPN Inject 0.5 mg into the skin every 7 (seven) days.  04/23/18  Yes [provider]  QUEtiapine (SEROQUEL) 50 MG tablet Take 50 mg by mouth 2 (two) times daily as needed (headache).  03/18/18  Yes [provider]  RENVELA 800 MG tablet Take 1,600 mg by mouth 3 (three) times daily with meals. 10/14/17  Yes [provider]  traMADol (ULTRAM) 50 MG tablet Take 1 tablet (50 mg total) by mouth every 6 (six) hours as needed. 12/20/17  Yes Sainani, Belia Heman, MD  VOLTAREN 1 % GEL Apply 2 g topically 3 (three) times daily as needed (left leg pain).  12/04/17  Yes [provider]  XARELTO 15 MG TABS tablet Take 15 mg by  mouth at bedtime.  03/14/18  Yes [provider]      PHYSICAL EXAMINATION:   VITAL SIGNS: Blood pressure 133/79, pulse 84, resp. rate 16, height 6\' 2"  (1.88 m), weight (!) 147.4 kg, SpO2 97 %.  GENERAL:  48 y.o.-year-old patient lying in the bed with no acute distress.  EYES: Pupils equal, round, reactive to light and accommodation. No scleral icterus. Extraocular muscles intact.  HEENT: Head atraumatic, normocephalic. Oropharynx and nasopharynx clear.  NECK:  Supple, no jugular venous distention. No thyroid enlargement, no tenderness.  LUNGS: Normal breath sounds bilaterally, no wheezing, rales,rhonchi or crepitation. No use of accessory muscles of respiration.  CARDIOVASCULAR: S1, S2 normal. No murmurs, rubs, or gallops.  ABDOMEN: Soft, nontender, nondistended. Bowel sounds present. No organomegaly or mass.  EXTREMITIES: No pedal edema, cyanosis, or clubbing.  NEUROLOGIC: Cranial nerves II through XII are intact. Muscle strength 5/5 in all extremities. Sensation  intact. Gait not checked.  PSYCHIATRIC: The patient is alert and oriented x 3.  SKIN: No obvious rash, lesion, or ulcer.   LABORATORY PANEL:   CBC Recent Labs  Lab 07/08/18 1306  WBC 12.1*  HGB 9.7*  HCT 31.0*  PLT 420*  MCV 83.8  MCH 26.2  MCHC 31.3  RDW 13.9  LYMPHSABS 1.6  MONOABS 0.8  EOSABS 0.2  BASOSABS 0.1   ------------------------------------------------------------------------------------------------------------------  Chemistries  Recent Labs  Lab 07/08/18 1306  NA 138  K 4.3  CL 104  CO2 24  GLUCOSE 119*  BUN 35*  CREATININE 3.86*  CALCIUM 8.9  AST 9*  ALT 6  ALKPHOS 81  BILITOT 0.3   ------------------------------------------------------------------------------------------------------------------ estimated creatinine clearance is 35.5 mL/min (A) (by C-G formula based on SCr of 3.86 mg/dL (H)). ------------------------------------------------------------------------------------------------------------------ No results for input(s): TSH, T4TOTAL, T3FREE, THYROIDAB in the last 72 hours.  Invalid input(s): FREET3   Coagulation profile No results for input(s): INR, PROTIME in the last 168 hours. ------------------------------------------------------------------------------------------------------------------- No results for input(s): DDIMER in the last 72 hours. -------------------------------------------------------------------------------------------------------------------  Cardiac Enzymes Recent Labs  Lab 07/08/18 1306  TROPONINI 0.09*   ------------------------------------------------------------------------------------------------------------------ Invalid input(s): POCBNP  ---------------------------------------------------------------------------------------------------------------  Urinalysis    Component Value Date/Time   COLORURINE STRAW (A) 07/08/2018 1525   APPEARANCEUR CLEAR (A) 07/08/2018 1525   LABSPEC 1.010 07/08/2018  1525   PHURINE 5.0 07/08/2018 1525   GLUCOSEU 50 (A) 07/08/2018 1525   HGBUR SMALL (A) 07/08/2018 1525   BILIRUBINUR NEGATIVE 07/08/2018 1525   KETONESUR NEGATIVE 07/08/2018 1525   PROTEINUR 100 (A) 07/08/2018 1525   NITRITE NEGATIVE 07/08/2018 1525   LEUKOCYTESUR NEGATIVE 07/08/2018 1525     RADIOLOGY: Ct Abdomen Pelvis Wo Contrast  Result Date: 07/08/2018 CLINICAL DATA:  Abdominal pain and nausea EXAM: CT ABDOMEN AND PELVIS WITHOUT CONTRAST TECHNIQUE: Multidetector CT imaging of the abdomen and pelvis was performed following the standard protocol without oral or IV contrast. COMPARISON:  None. FINDINGS: Lower chest: Lung bases are clear. Pacemaker lead tips attached to the right atrium and right ventricle. Hepatobiliary: No focal liver lesions are apparent on this noncontrast enhanced study. Gallbladder is mildly contracted without appreciable wall thickening. There is no biliary duct dilatation. Pancreas: There is no pancreatic mass or inflammatory focus. Spleen: No splenic lesions are evident. Adrenals/Urinary Tract: There is an apparent right adrenal myelolipoma measuring 2.5 x 2.2 cm. Left adrenal appears normal. Kidneys bilaterally show no evident mass or hydronephrosis on either side. There is an extrarenal pelvis on each side, an anatomic variant. There is no appreciable renal or ureteral calculus  on either side. Urinary bladder is midline. The urinary bladder wall is mildly thickened in a generalized manner. Stomach/Bowel: There is no appreciable bowel wall or mesenteric thickening. There is no evident bowel obstruction. There is no appreciable free air or portal venous air. Vascular/Lymphatic: There are foci of aortic and iliac artery atherosclerosis. No aneurysm evident. No adenopathy is appreciable in the abdomen or pelvis. Reproductive: Prostate and seminal vesicles are normal in size and contour. No pelvic masses evident. Other: The appendix appears normal. There is no abscess or ascites  in the abdomen or pelvis. There is a small ventral hernia containing only fat. There is postoperative change in the left inguinal region with scarring and clips present. Musculoskeletal: There are old healed rib fractures on the left. No blastic or lytic bone lesions are evident. No intramuscular lesions are appreciable. IMPRESSION: 1. Thickening of the urinary bladder wall, a finding felt to indicate a degree of cystitis. No renal or ureteral calculi evident. No hydronephrosis. 2.  Right adrenal myelolipoma, a benign lesion. 3. No bowel obstruction. No abscess in the abdomen or pelvis. Appendix appears normal. 4.  Small ventral hernia containing only fat. 5.  Aortic Atherosclerosis (ICD10-I70.0). Electronically Signed   By: Lowella Grip III M.D.   On: 07/08/2018 14:02   Ct Head Wo Contrast  Result Date: 07/08/2018 CLINICAL DATA:  Altered level of consciousness EXAM: CT HEAD WITHOUT CONTRAST TECHNIQUE: Contiguous axial images were obtained from the base of the skull through the vertex without intravenous contrast. COMPARISON:  May 22, 2018 FINDINGS: Brain: Ventricles are normal in size and configuration. There is borderline frontal atrophy bilaterally for age, stable. There is no appreciable intracranial mass, hemorrhage, extra-axial fluid collection, or midline shift. The brain parenchyma appears unremarkable. There is no demonstrable acute infarct. Vascular: There is no hyperdense vessel. There is no appreciable vascular calcification. Skull: The bony calvarium appears intact. Sinuses/Orbits: There is mild mucosal thickening in several ethmoid air cells. Other visualized paranasal sinuses are clear. Orbits appear symmetric bilaterally. Other: Mastoid air cells are clear. IMPRESSION: Slight frontal atrophy for age. Ventricles normal in size and configuration. Brain parenchyma appears unremarkable. No acute infarct. No mass or hemorrhage. Mild mucosal thickening in several ethmoid air cells.  Electronically Signed   By: Lowella Grip III M.D.   On: 07/08/2018 13:57    EKG: Orders placed or performed during the hospital encounter of 05/22/18  . EKG 12-Lead  . EKG 12-Lead  . ED EKG  . ED EKG    IMPRESSION AND PLAN: Patient is a 48 year old morbidly obese male with chest pain and a syncopal  1.  Chest pain with history of known coronary artery disease Patient has had all his stents at [facility] he has a complex coronary artery anatomy At this point we will trend his cardiac enzymes.  Unless significantly elevated I will not consult cardiology who will need to follow-up with his primary cardiologist Continue current regimen for his coronary artery disease including Brilinta  2.  Syncope in reviewing his previous notes they feel his syncope is pseudoseizures versus orthostatic hypotension I will obtain orthostatic blood pressure check  3.  Diabetes type 2 we will place on sliding scale insulin continue her home regimen  4.  Paroxysmal atrial fibrillation continue therapy with amiodarone and Xarelto  5.  Hyperlipidemia continue Zetia and Lipitor  6.  Hypertension continue therapy with Coreg  7.  Chronic systolic CHF continue Bumex currently compensated    All the records are reviewed and  case discussed with ED provider. Management plans discussed with the patient, family and they are in agreement.  CODE STATUS: Code Status History    Date Active Date Inactive Code Status Order ID Comments User Context   03/08/2018 2246 03/11/2018 2257 Full Code [id number]  Gorden Harms, MD Inpatient   12/16/2017 2226 12/17/2017 1550 Full Code [id number]  Thornton Park, MD Inpatient       TOTAL TIME TAKING CARE OF THIS PATIENT: 55 minutes.    Dustin Flock M.D on 07/08/2018 at 4:05 PM  Between 7am to 6pm - Pager - [phone]  After 6pm go to [email or web address] - password [email or web address]  [employer] [facility] Office  [phone]  CC: Primary care physician; Caryl Never,  MD

## 2018-07-08 NOTE — ED Provider Notes (Signed)
[facility] Emergency Department Provider Note  ____________________________________________  Time seen: Approximately 2:26 PM  I have reviewed the triage vital signs and the nursing notes.   HISTORY  Chief Complaint Near Syncope    HPI Edwin [name] is a 48 y.o. male with a history of CAD CHF diabetes hypertension and A. fib who comes the ED complaining of dizziness and central abdominal pain since this morning.  Denies chest pain or shortness of breath.  He had a few episodes of syncope at home and 1 with EMS.  He denies any specific prodromal symptoms.  No thunderclap headache or neck pain.  No back pain.  Abdominal pain is nonradiating, no aggravating or alleviating factors.     Past Medical History:  Diagnosis Date  . CAD (coronary artery disease)   . Chronic systolic heart failure ([place])   . Diabetes mellitus without complication ([place])   . Heart attack ([place])   . Hyperlipidemia   . Hypertension   . Ischemic cardiomyopathy   . Paroxysmal atrial fibrillation [facility])      Patient Active Problem List   Diagnosis Date Noted  . NSTEMI (non-ST elevated myocardial infarction) ([place]) 03/08/2018  . Infection of left knee ([place]) 12/16/2017     Past Surgical History:  Procedure Laterality Date  . [employer] pacemaker    . CARDIAC SURGERY    . IRRIGATION AND DEBRIDEMENT KNEE Left 12/17/2017   Procedure: LEFT KNEE DEBRIDEMENT AND SYNOVECTOMY;  Surgeon: Thornton Park, MD;  Location: [facility] ORS;  Service: Orthopedics;  Laterality: Left;  . JOINT REPLACEMENT    . LEFT HEART CATH AND CORONARY ANGIOGRAPHY Left 03/09/2018   Procedure: LEFT HEART CATH AND CORONARY ANGIOGRAPHY;  Surgeon: Nelva Bush, MD;  Location: [place] CV LAB;  Service: Cardiovascular;  Laterality: Left;  . PERCUTANEOUS CORONARY STENT INTERVENTION (PCI-S)       Prior to Admission medications   Medication Sig Start Date End Date Taking? Authorizing Provider  acetaminophen  (TYLENOL) 500 MG tablet TAKE 2 TABLETS (1 000MG ) BY MOUTH EVERY 8 HOURS AS NEEDED FOR PAIN 06/22/18 06/22/19 Yes [provider]  albuterol (PROVENTIL HFA;VENTOLIN HFA) 108 (90 Base) MCG/ACT inhaler Inhale 2 puffs into the lungs every 6 (six) hours as needed for wheezing or shortness of breath. 04/29/18  Yes Schuyler Amor, MD  Alirocumab 150 MG/ML SOAJ Inject 150 mg into the skin. Every 14 days 06/24/18  Yes [provider]  allopurinol (ZYLOPRIM) 100 MG tablet Take 50 mg by mouth every other day. 03/18/18  Yes [provider]  amiodarone (PACERONE) 200 MG tablet Take 200 mg by mouth daily. 09/14/17  Yes [provider]  atorvastatin (LIPITOR) 80 MG tablet Take 80 mg by mouth every evening.  09/14/17  Yes [provider]  BRILINTA 90 MG TABS tablet Take 90 mg by mouth 2 (two) times daily. 09/14/17  Yes [provider]  bumetanide (BUMEX) 2 MG tablet Take 2 mg by mouth daily. 09/14/17  Yes [provider]  carvedilol (COREG) 25 MG tablet Take 25-50 mg by mouth See admin instructions. Take 1 tablet (25MG ) by mouth every morning and 2 tablets (50MG ) by mouth every night 10/16/17  Yes [provider]  cholecalciferol (VITAMIN D) 1000 units tablet Take 5,000 Units by mouth daily.   Yes [provider]  ezetimibe (ZETIA) 10 MG tablet Take 10 mg by mouth daily. 10/14/17  Yes [provider]  FLUoxetine (PROZAC) 20 MG capsule Take 60 mg by mouth  at bedtime.  12/04/17  Yes [provider]  gabapentin (NEURONTIN) 300 MG capsule Take 300 mg by mouth 2 (two) times daily. 06/22/18 07/22/18 Yes [provider]  insulin aspart (NOVOLOG) 100 UNIT/ML injection Inject 15 Units into the skin 3 (three) times daily with meals. 12/20/17  Yes Sainani, Belia Heman, MD  insulin detemir (LEVEMIR) 100 UNIT/ML injection Inject 0.4 mLs (40 Units total) into the skin 2 (two) times daily. Patient taking differently: Inject 40 Units into the  skin at bedtime.  12/20/17  Yes Henreitta Leber, MD  isosorbide mononitrate (IMDUR) 30 MG 24 hr tablet Take 1 tablet (30 mg total) by mouth daily. 03/12/18  Yes Epifanio Lesches, MD  Melatonin 3 MG CAPS Take 9 mg by mouth at bedtime.   Yes [provider]  nitroGLYCERIN (NITROSTAT) 0.4 MG SL tablet Place 0.4 mg under the tongue every 5 (five) minutes as needed for chest pain.   Yes [provider]  omeprazole (PRILOSEC) 20 MG capsule Take 20 mg by mouth daily. 05/28/18 05/28/19 Yes [provider]  oxyCODONE-acetaminophen (PERCOCET) 5-325 MG tablet Take 1 tablet by mouth every 8 (eight) hours as needed. 06/09/18  Yes Earleen Newport, MD  OZEMPIC, 0.25 OR 0.5 MG/DOSE, 2 MG/1.5ML SOPN Inject 0.5 mg into the skin every 7 (seven) days.  04/23/18  Yes [provider]  QUEtiapine (SEROQUEL) 50 MG tablet Take 50 mg by mouth 2 (two) times daily as needed (headache).  03/18/18  Yes [provider]  RENVELA 800 MG tablet Take 1,600 mg by mouth 3 (three) times daily with meals. 10/14/17  Yes [provider]  traMADol (ULTRAM) 50 MG tablet Take 1 tablet (50 mg total) by mouth every 6 (six) hours as needed. 12/20/17  Yes Sainani, Belia Heman, MD  VOLTAREN 1 % GEL Apply 2 g topically 3 (three) times daily as needed (left leg pain).  12/04/17  Yes [provider]  XARELTO 15 MG TABS tablet Take 15 mg by mouth at bedtime.  03/14/18  Yes [provider]     Allergies Losartan and Morphine and related   Family History  Problem Relation Age of Onset  . Diabetes Mother   . Peripheral vascular disease Mother   . COPD Father     Social History Social History   Tobacco Use  . 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 Smokeless tobacco: Current User    Types: Snuff  Substance Use Topics  . Alcohol use: Never    Frequency: Never  . Drug use: Never    Review of Systems  Constitutional:   No fever or chills.  ENT:   No sore throat. No  rhinorrhea. Cardiovascular:   No chest pain or syncope. Respiratory:   No dyspnea or cough. Gastrointestinal:   As it of as above for abdominal pain without vomiting and diarrhea.  Musculoskeletal:   Negative for focal pain or swelling All other systems reviewed and are negative except as documented above in ROS and HPI.  ____________________________________________   PHYSICAL EXAM:  VITAL SIGNS: ED Triage Vitals [07/08/18 1311]  Enc Vitals Group     BP      Pulse      Resp      Temp      Temp src      SpO2      Weight (!) 325 lb (147.4 kg)     Height 6\' 2"  (1.88 m)     Head Circumference  Peak Flow      Pain Score 2     Pain Loc      Pain Edu?      Excl. in [place]?     Vital signs reviewed, nursing assessments reviewed.   Constitutional:   Alert and oriented.  Ill-appearing. Eyes:   Conjunctivae are normal. EOMI. PERRL. ENT      Head:   Normocephalic and atraumatic.      Nose:   No congestion/rhinnorhea.       Mouth/Throat:   Dry mucous membranes, no pharyngeal erythema. No peritonsillar mass.       Neck:   No meningismus. Full ROM. Hematological/Lymphatic/Immunilogical:   No cervical lymphadenopathy. Cardiovascular:   RRR. Symmetric bilateral radial and DP pulses.  No murmurs. Cap refill less than 2 seconds. Respiratory:   Normal respiratory effort without tachypnea/retractions. Breath sounds are clear and equal bilaterally. No wheezes/rales/rhonchi. Gastrointestinal:   Soft with suprapubic and right lower quadrant tenderness. Non distended. There is no CVA tenderness.  No rebound, rigidity, or guarding. Musculoskeletal:   Normal range of motion in all extremities. No joint effusions.  No lower extremity tenderness.  No edema. Neurologic:   Normal speech and language.  Cranial nerves III through XII intact Motor grossly intact. No acute focal neurologic deficits are appreciated.  Skin:    Skin is warm, dry and intact. No rash noted.  No petechiae, purpura, or  bullae.  ____________________________________________    LABS (pertinent positives/negatives) (all labs ordered are listed, but only abnormal results are displayed) Labs Reviewed  COMPREHENSIVE METABOLIC PANEL - Abnormal; Notable for the following components:      Result Value   Glucose, Bld 119 (*)    BUN 35 (*)    Creatinine, Ser 3.86 (*)    Albumin 2.7 (*)    AST 9 (*)    GFR calc non Af Amer 17 (*)    GFR calc Af Amer 20 (*)    All other components within normal limits  TROPONIN I - Abnormal; Notable for the following components:   Troponin I 0.09 (*)    All other components within normal limits  CBC WITH DIFFERENTIAL/PLATELET - Abnormal; Notable for the following components:   WBC 12.1 (*)    RBC 3.70 (*)    Hemoglobin 9.7 (*)    HCT 31.0 (*)    Platelets 420 (*)    Neutro Abs 9.5 (*)    All other components within normal limits  URINALYSIS, COMPLETE (UACMP) WITH MICROSCOPIC - Abnormal; Notable for the following components:   Color, Urine STRAW (*)    APPearance CLEAR (*)    Glucose, UA 50 (*)    Hgb urine dipstick SMALL (*)    Protein, ur 100 (*)    All other components within normal limits  URINE CULTURE  LIPASE, BLOOD   ____________________________________________   EKG  Interpreted by me Sinus rhythm rate of 90, normal axis and intervals.  LVH with associated repolarization abnormality in the high lateral leads.  Normal QRS and ST segments.  Isolated T wave inversion in V2 which is nonspecific.  ____________________________________________    RADIOLOGY  Ct Abdomen Pelvis Wo Contrast  Result Date: 07/08/2018 CLINICAL DATA:  Abdominal pain and nausea EXAM: CT ABDOMEN AND PELVIS WITHOUT CONTRAST TECHNIQUE: Multidetector CT imaging of the abdomen and pelvis was performed following the standard protocol without oral or IV contrast. COMPARISON:  None. FINDINGS: Lower chest: Lung bases are clear. Pacemaker lead tips attached to the right atrium and  right  ventricle. Hepatobiliary: No focal liver lesions are apparent on this noncontrast enhanced study. Gallbladder is mildly contracted without appreciable wall thickening. There is no biliary duct dilatation. Pancreas: There is no pancreatic mass or inflammatory focus. Spleen: No splenic lesions are evident. Adrenals/Urinary Tract: There is an apparent right adrenal myelolipoma measuring 2.5 x 2.2 cm. Left adrenal appears normal. Kidneys bilaterally show no evident mass or hydronephrosis on either side. There is an extrarenal pelvis on each side, an anatomic variant. There is no appreciable renal or ureteral calculus on either side. Urinary bladder is midline. The urinary bladder wall is mildly thickened in a generalized manner. Stomach/Bowel: There is no appreciable bowel wall or mesenteric thickening. There is no evident bowel obstruction. There is no appreciable free air or portal venous air. Vascular/Lymphatic: There are foci of aortic and iliac artery atherosclerosis. No aneurysm evident. No adenopathy is appreciable in the abdomen or pelvis. Reproductive: Prostate and seminal vesicles are normal in size and contour. No pelvic masses evident. Other: The appendix appears normal. There is no abscess or ascites in the abdomen or pelvis. There is a small ventral hernia containing only fat. There is postoperative change in the left inguinal region with scarring and clips present. Musculoskeletal: There are old healed rib fractures on the left. No blastic or lytic bone lesions are evident. No intramuscular lesions are appreciable. IMPRESSION: 1. Thickening of the urinary bladder wall, a finding felt to indicate a degree of cystitis. No renal or ureteral calculi evident. No hydronephrosis. 2.  Right adrenal myelolipoma, a benign lesion. 3. No bowel obstruction. No abscess in the abdomen or pelvis. Appendix appears normal. 4.  Small ventral hernia containing only fat. 5.  Aortic Atherosclerosis (ICD10-I70.0).  Electronically Signed   By: Lowella Grip III M.D.   On: 07/08/2018 14:02   Ct Head Wo Contrast  Result Date: 07/08/2018 CLINICAL DATA:  Altered level of consciousness EXAM: CT HEAD WITHOUT CONTRAST TECHNIQUE: Contiguous axial images were obtained from the base of the skull through the vertex without intravenous contrast. COMPARISON:  May 22, 2018 FINDINGS: Brain: Ventricles are normal in size and configuration. There is borderline frontal atrophy bilaterally for age, stable. There is no appreciable intracranial mass, hemorrhage, extra-axial fluid collection, or midline shift. The brain parenchyma appears unremarkable. There is no demonstrable acute infarct. Vascular: There is no hyperdense vessel. There is no appreciable vascular calcification. Skull: The bony calvarium appears intact. Sinuses/Orbits: There is mild mucosal thickening in several ethmoid air cells. Other visualized paranasal sinuses are clear. Orbits appear symmetric bilaterally. Other: Mastoid air cells are clear. IMPRESSION: Slight frontal atrophy for age. Ventricles normal in size and configuration. Brain parenchyma appears unremarkable. No acute infarct. No mass or hemorrhage. Mild mucosal thickening in several ethmoid air cells. Electronically Signed   By: Lowella Grip III M.D.   On: 07/08/2018 13:57    ____________________________________________   PROCEDURES Procedures  ____________________________________________  DIFFERENTIAL DIAGNOSIS   Appendicitis, cystitis, diverticulitis, intra-abdominal abscess, bowel obstruction, cholecystitis.  Doubt mesenteric ischemia dissection or AAA.  CLINICAL IMPRESSION / ASSESSMENT AND PLAN / ED COURSE  Medications ordered in the ED: Medications  acetaminophen (OFIRMEV) IV 1,000 mg (0 mg Intravenous Hold 07/08/18 1350)  ondansetron (ZOFRAN) injection 4 mg (4 mg Intravenous Given 07/08/18 1345)  morphine 4 MG/ML injection 4 mg (4 mg Intravenous Given 07/08/18 1345)     Pertinent labs & imaging results that were available during my care of the patient were reviewed by me and considered in my medical  decision making (see chart for details).  Edwin [name] was evaluated in Emergency Department on 2018/07/27 for the symptoms described in the history of present illness. He was evaluated in the context of the global COVID-19 pandemic, which necessitated consideration that the patient might be at risk for infection with the SARS-CoV-2 virus that causes COVID-19. Institutional protocols and algorithms that pertain to the evaluation of patients at risk for COVID-19 are in a state of rapid change based on information released by regulatory bodies including the CDC and federal and state organizations. These policies and algorithms were followed during the patient's care in the ED.     Clinical Course as of July 26, 1600  27-Jul-2018  1310 Pronounced right lower quadrant tenderness, high suspicion for appendicitis, diverticulitis, colitis.  Possible biliary disease.  Due to CKD, will need to get a noncontrast CT scan of the abdomen.  Doubt AAA or dissection.  Low suspicion for ACS PE pneumothorax pericarditis or pneumonia.   [PS]  1431 Troponin elevated above chronic baseline at 0.09.  CT suggestive of cystitis.  Will focus on urine collection and give ceftriaxone.  Patient will need to be hospitalized due to syncope and elevation of his troponin for further cardiac monitoring..   [PS]  9379 Urinalysis normal.  Urine culture ordered but will defer antibiotics pending definitive microbial analysis   [PS]    Clinical Course User Index [PS] Carrie Mew, MD    ----------------------------------------- 4:02 PM on 07/27/2018 -----------------------------------------  Discussed with hospitalist Patel for further evaluation   ____________________________________________   FINAL CLINICAL IMPRESSION(S) / ED DIAGNOSES    Final diagnoses:  Syncope,  unspecified syncope type  Elevated troponin  Generalized abdominal pain     ED Discharge Orders    None      Portions of this note were generated with dragon dictation software. Dictation errors [place] occur despite best attempts at proofreading.   Carrie Mew, MD 27-Jul-2018 [phone]

## 2018-07-08 NOTE — ED Notes (Signed)
Patient states he is unable to urinate at this time. Refusing in and out cath. States he will notify RN when he thinks he can get sample.

## 2018-07-08 NOTE — Unmapped (Addendum)
Return call from CCM Intake      PCP Action:re order semaglutide, tramadol, xarelto rxs - [facility]/[address]    CCM Action:per protocol re ordered Bumex, VitD3, albuteron inhaler, gabapentin. and voltaren gel    Patient Action:pt in [facility] today. wife following up w/ all medications,  Morrie Sheldon pharmacist reviewd meds needed for reorder per [name].   [name] requesting rxs:    Voltaren gel,  Bumex   Vit D3  Albuterol Inhaler  Gabapentin,  Semaglutide 0.5mg  [place] , [name] states,  pharmacist says to order the 7.72ml one- rx needs to be changed to 0.5mg  Verdigris q 7 days  Tramadol- unable to fill by protocol  Xarelto- unable to fill by protocol    Wife, [name], calls states called 911 this afternoon for pt b/c  BP was 141/93, then he stood up and it was 80/60. He had sharp stomach pains and was passing out, going unconscious   CCM asked how pt is doing now.  Wife does not know, states pt is at [facility]. All elective GI testing has been held off d/t COVID19 pandemic.  Wife requesting refills to go to [facility] off [address]:

## 2018-07-08 NOTE — Unmapped (Signed)
The Internal Medicine CCM team was unable to reach Jeremy Oliver to follow-up in regards to wife's VM to call back.    Pt's wife left vm 07/08/18 states, Please call me back today    Left vm for pt's wife , [name].    Will follow up

## 2018-07-09 DIAGNOSIS — R1084 Generalized abdominal pain: Secondary | ICD-10-CM | POA: Diagnosis not present

## 2018-07-09 LAB — CBC
HCT: 27.1 % — ABNORMAL LOW (ref 39.0–52.0)
Hemoglobin: 8.3 g/dL — ABNORMAL LOW (ref 13.0–17.0)
MCH: 26 pg (ref 26.0–34.0)
MCHC: 30.6 g/dL (ref 30.0–36.0)
MCV: 85 fL (ref 80.0–100.0)
Platelets: 365 10*3/uL (ref 150–400)
RBC: 3.19 MIL/uL — ABNORMAL LOW (ref 4.22–5.81)
RDW: 14.2 % (ref 11.5–15.5)
WBC: 11.1 10*3/uL — ABNORMAL HIGH (ref 4.0–10.5)
nRBC: 0 % (ref 0.0–0.2)

## 2018-07-09 LAB — BASIC METABOLIC PANEL
Anion gap: 9 (ref 5–15)
BUN: 34 mg/dL — ABNORMAL HIGH (ref 6–20)
CO2: 21 mmol/L — ABNORMAL LOW (ref 22–32)
Calcium: 8.2 mg/dL — ABNORMAL LOW (ref 8.9–10.3)
Chloride: 105 mmol/L (ref 98–111)
Creatinine, Ser: 3.88 mg/dL — ABNORMAL HIGH (ref 0.61–1.24)
GFR calc Af Amer: 20 mL/min — ABNORMAL LOW (ref >60)
GFR calc non Af Amer: 17 mL/min — ABNORMAL LOW (ref >60)
Glucose, Bld: 165 mg/dL — ABNORMAL HIGH (ref 70–99)
Potassium: 4.3 mmol/L (ref 3.5–5.1)
Sodium: 135 mmol/L (ref 135–145)

## 2018-07-09 LAB — URINE CULTURE: Culture: NO GROWTH

## 2018-07-09 LAB — TROPONIN I: Troponin I: 0.09 ng/mL (ref <0.03)

## 2018-07-09 LAB — GLUCOSE, CAPILLARY: Glucose-Capillary: 160 mg/dL — ABNORMAL HIGH (ref 70–99)

## 2018-07-09 NOTE — Plan of Care (Signed)
  Problem: Education: Goal: Knowledge of General Education information will improve Description: Including pain rating scale, medication(s)/side effects and non-pharmacologic comfort measures Outcome: Progressing   Problem: Safety: Goal: Ability to remain free from injury will improve Outcome: Progressing   

## 2018-07-09 NOTE — Discharge Instructions (Signed)
It was so nice to meet you during this hospitalization!  You came into the hospital with chest pain and abdominal pain. Your troponin stayed flat and the CT scan of your belly was normal.  Please make sure you follow-up with your primary care doctor and your cardiologist in the next 5 days.  Take care, Dr. Brett Albino

## 2018-07-09 NOTE — Discharge Summary (Signed)
[facility]win [name] at [place] NAME: Edwin [name]    MR#:  [id number]  DATE OF BIRTH:  [birth year]  DATE OF ADMISSION:  07/08/2018   ADMITTING PHYSICIAN: Dustin Flock, MD  DATE OF DISCHARGE: 07/09/2018 12:00 PM  PRIMARY CARE PHYSICIAN: Caryl Never, MD   ADMISSION DIAGNOSIS:  Generalized abdominal pain [R10.84] Elevated troponin [R79.89] Syncope, unspecified syncope type [R55] DISCHARGE DIAGNOSIS:  Active Problems:   Chest pain  SECONDARY DIAGNOSIS:   Past Medical History:  Diagnosis Date   CAD (coronary artery disease)    Chronic systolic heart failure ([name])    Diabetes mellitus without complication ([place])    Heart attack ([place])    Hyperlipidemia    Hypertension    Ischemic cardiomyopathy    Paroxysmal atrial fibrillation [facility])    HOSPITAL COURSE:   Edwin [name] is a 48 year old male who presented to the ED with chest pain and syncope.  He had been seen in the ED multiple times with the same complaints.  His troponin was mildly elevated during this ED visit, so he was admitted for further evaluation.  Chest pain with history of known coronary artery disease-resolved. -Able to move around without any pain -Troponins mildly elevated but flat -Continued Coreg, Lipitor, Brilinta -Patient advised to follow-up with cardiologist in the next 5 days  Syncope-felt to be due to pseudoseizures versus orthostatic hypotension -Orthostatics were negative  ? UTI- CT abdomen pelvis showed some possible bladder wall thickening.  Patient was asymptomatic and afebrile. -Given 1 dose of ceftriaxone, but antibiotics were not continued due to lack of symptoms or evidence of overt infection. -Urine culture pending at the time of discharge  Type 2 diabetes -Home diabetes medications were restarted on discharge  Paroxysmal atrial fibrillation-rate controlled this admission -Continued home amiodarone, coreg, and  xarelto  Hyperlipidemia-stable -Continued Zetia and Lipitor  Hypertension-BPs well-controlled -Continued Coreg  Chronic systolic CHF-no signs of acute exacerbation -Continued home Bumex   CKD stage IV- kidney function has been progressively worsening over the last few months -Needs BMP rechecked as an outpatient  DISCHARGE CONDITIONS:  Twice daily Recurrent syncope Type 2 diabetes Paroxysmal atrial fibrillation Hyperlipidemia Hypertension Chronic systolic congestive heart failure CKD stage IV CONSULTS OBTAINED:   DRUG ALLERGIES:   Allergies  Allergen Reactions   Losartan     Hyperkalemia (K>6)   Morphine And Related Other (See Comments)    Overdose in hospital per patient report   DISCHARGE MEDICATIONS:   Allergies as of 07/09/2018      Reactions   Losartan    Hyperkalemia (K>6)   Morphine And Related Other (See Comments)   Overdose in hospital per patient report      Medication List    TAKE these medications   acetaminophen 500 MG tablet Commonly known as:  TYLENOL TAKE 2 TABLETS (1 000MG ) BY MOUTH EVERY 8 HOURS AS NEEDED FOR PAIN   albuterol 108 (90 Base) MCG/ACT inhaler Commonly known as:  PROVENTIL HFA;VENTOLIN HFA Inhale 2 puffs into the lungs every 6 (six) hours as needed for wheezing or shortness of breath.   Alirocumab 150 MG/ML Soaj Inject 150 mg into the skin. Every 14 days   allopurinol 100 MG tablet Commonly known as:  ZYLOPRIM Take 50 mg by mouth every other day.   amiodarone 200 MG tablet Commonly known as:  PACERONE Take 200 mg by mouth daily.   atorvastatin 80 MG tablet Commonly known as:  LIPITOR Take 80 mg by mouth every evening.  Brilinta 90 MG Tabs tablet Generic drug:  ticagrelor Take 90 mg by mouth 2 (two) times daily.   bumetanide 2 MG tablet Commonly known as:  BUMEX Take 2 mg by mouth daily.   carvedilol 25 MG tablet Commonly known as:  COREG Take 25-50 mg by mouth See admin instructions. Take 1 tablet  (25MG ) by mouth every morning and 2 tablets (50MG ) by mouth every night   cholecalciferol 25 MCG (1000 UT) tablet Commonly known as:  VITAMIN D Take 5,000 Units by mouth daily.   ezetimibe 10 MG tablet Commonly known as:  ZETIA Take 10 mg by mouth daily.   FLUoxetine 20 MG capsule Commonly known as:  PROZAC Take 60 mg by mouth at bedtime.   gabapentin 300 MG capsule Commonly known as:  NEURONTIN Take 300 mg by mouth 2 (two) times daily.   insulin aspart 100 UNIT/ML injection Commonly known as:  novoLOG Inject 15 Units into the skin 3 (three) times daily with meals.   insulin detemir 100 UNIT/ML injection Commonly known as:  LEVEMIR Inject 0.4 mLs (40 Units total) into the skin 2 (two) times daily. What changed:  when to take this   isosorbide mononitrate 30 MG 24 hr tablet Commonly known as:  IMDUR Take 1 tablet (30 mg total) by mouth daily.   Melatonin 3 MG Caps Take 9 mg by mouth at bedtime.   nitroGLYCERIN 0.4 MG SL tablet Commonly known as:  NITROSTAT Place 0.4 mg under the tongue every 5 (five) minutes as needed for chest pain.   omeprazole 20 MG capsule Commonly known as:  PRILOSEC Take 20 mg by mouth daily.   oxyCODONE-acetaminophen 5-325 MG tablet Commonly known as:  Percocet Take 1 tablet by mouth every 8 (eight) hours as needed.   Ozempic (0.25 or 0.5 MG/DOSE) 2 MG/1.5ML Sopn Generic drug:  Semaglutide(0.25 or 0.5MG /DOS) Inject 0.5 mg into the skin every 7 (seven) days.   QUEtiapine 50 MG tablet Commonly known as:  SEROQUEL Take 50 mg by mouth 2 (two) times daily as needed (headache).   Renvela 800 MG tablet Generic drug:  sevelamer carbonate Take 1,600 mg by mouth 3 (three) times daily with meals.   traMADol 50 MG tablet Commonly known as:  ULTRAM Take 1 tablet (50 mg total) by mouth every 6 (six) hours as needed.   Voltaren 1 % Gel Generic drug:  diclofenac sodium Apply 2 g topically 3 (three) times daily as needed (left leg pain).    Xarelto 15 MG Tabs tablet Generic drug:  Rivaroxaban Take 15 mg by mouth at bedtime.        DISCHARGE INSTRUCTIONS:  1.  Follow-up with PCP in 5 days 2.  Follow-up with cardiology in 5 days 3.  Let PCP know if you develop any UTI symptoms 4.  Urine culture pending at the time of discharge DIET:  Cardiac diet and Diabetic diet DISCHARGE CONDITION:  Stable ACTIVITY:  Activity as tolerated OXYGEN:  Home Oxygen: No.  Oxygen Delivery: room air DISCHARGE LOCATION:  home   If you experience worsening of your admission symptoms, develop shortness of breath, life threatening emergency, suicidal or homicidal thoughts you must seek medical attention immediately by calling 911 or calling your MD immediately  if symptoms less severe.  You Must read complete instructions/literature along with all the possible adverse reactions/side effects for all the Medicines you take and that have been prescribed to you. Take any new Medicines after you have completely understood and accpet all the  possible adverse reactions/side effects.   Please note  You were cared for by a hospitalist during your hospital stay. If you have any questions about your discharge medications or the care you received while you were in the hospital after you are discharged, you can call the unit and asked to speak with the hospitalist on call if the hospitalist that took care of you is not available. Once you are discharged, your primary care physician will handle any further medical issues. Please note that NO REFILLS for any discharge medications will be authorized once you are discharged, as it is imperative that you return to your primary care physician (or establish a relationship with a primary care physician if you do not have one) for your aftercare needs so that they can reassess your need for medications and monitor your lab values.    On the day of Discharge:  VITAL SIGNS:  Blood pressure 113/72, pulse 88,  temperature 97.7 F (36.5 C), temperature source Oral, resp. rate 18, height 6\' 2"  (1.88 m), weight (!) 151.7 kg, SpO2 98 %. PHYSICAL EXAMINATION:  GENERAL:  48 y.o.-year-old patient lying in the bed with no acute distress.  EYES: Pupils equal, round, reactive to light and accommodation. No scleral icterus. Extraocular muscles intact.  HEENT: Head atraumatic, normocephalic. Oropharynx and nasopharynx clear.  NECK:  Supple, no jugular venous distention. No thyroid enlargement, no tenderness.  LUNGS: Normal breath sounds bilaterally, no wheezing, rales,rhonchi or crepitation. No use of accessory muscles of respiration.  CARDIOVASCULAR: RRR, S1, S2 normal. No murmurs, rubs, or gallops.  ABDOMEN: Soft, non-tender, non-distended. Bowel sounds present. No organomegaly or mass.  EXTREMITIES: No pedal edema, cyanosis, or clubbing.  NEUROLOGIC: Cranial nerves II through XII are intact. Muscle strength 5/5 in all extremities. Sensation intact. Gait not checked.  PSYCHIATRIC: The patient is alert and oriented x 3.  SKIN: No obvious rash, lesion, or ulcer.  DATA REVIEW:   CBC Recent Labs  Lab 07/09/18 0441  WBC 11.1*  HGB 8.3*  HCT 27.1*  PLT 365    Chemistries  Recent Labs  Lab 07/08/18 1306 07/09/18 0441  NA 138 135  K 4.3 4.3  CL 104 105  CO2 24 21*  GLUCOSE 119* 165*  BUN 35* 34*  CREATININE 3.86* 3.88*  CALCIUM 8.9 8.2*  AST 9*  --   ALT 6  --   ALKPHOS 81  --   BILITOT 0.3  --      Microbiology Results  Results for orders placed or performed during the hospital encounter of 12/16/17  Body fluid culture     Status: None   Collection Time: 12/16/17  5:47 PM  Result Value Ref Range Status   Specimen Description   Final    KNEE LEFT KNEE Performed at [facility], [address], [place], [place] [address]    Special Requests   Final    Normal Performed at [facility], [address] [place], [place] [address]    Gram Stain   Final    ABUNDANT  WBC PRESENT, PREDOMINANTLY PMN NO ORGANISMS SEEN    Culture   Final    NO GROWTH 3 DAYS Performed at [facility], [place] [address], [place], [place] [address]    Report Status 12/20/2017 FINAL  Final  Surgical pcr screen     Status: None   Collection Time: 12/17/17 12:20 AM  Result Value Ref Range Status   MRSA, PCR NEGATIVE NEGATIVE Final   Staphylococcus  aureus NEGATIVE NEGATIVE Final    Comment: (NOTE) The Xpert SA Assay (FDA approved for NASAL specimens in patients 48 years of age and older), is one component of a comprehensive surveillance program. It is not intended to diagnose infection nor to guide or monitor treatment. Performed at [facility] [address] [place], [place] [address]   Aerobic/Anaerobic Culture (surgical/deep wound)     Status: None   Collection Time: 12/17/17 12:18 PM  Result Value Ref Range Status   Specimen Description   Final    WOUND Performed at [facility], [address] [place], [place] [address]    Special Requests   Final    NONE Performed at [facility], [address] [place], [place] [address]    Gram Stain   Final    MODERATE WBC PRESENT, PREDOMINANTLY PMN NO ORGANISMS SEEN    Culture   Final    No growth aerobically or anaerobically. Performed at [facility], [place] [address], [place], [place] [address]    Report Status 12/23/2017 FINAL  Final    RADIOLOGY:  Ct Abdomen Pelvis Wo Contrast  Result Date: 07/08/2018 CLINICAL DATA:  Abdominal pain and nausea EXAM: CT ABDOMEN AND PELVIS WITHOUT CONTRAST TECHNIQUE: Multidetector CT imaging of the abdomen and pelvis was performed following the standard protocol without oral or IV contrast. COMPARISON:  None. FINDINGS: Lower chest: Lung bases are clear. Pacemaker lead tips attached to the right atrium and right ventricle. Hepatobiliary: No focal liver lesions are apparent on this noncontrast enhanced study. Gallbladder is mildly contracted  without appreciable wall thickening. There is no biliary duct dilatation. Pancreas: There is no pancreatic mass or inflammatory focus. Spleen: No splenic lesions are evident. Adrenals/Urinary Tract: There is an apparent right adrenal myelolipoma measuring 2.5 x 2.2 cm. Left adrenal appears normal. Kidneys bilaterally show no evident mass or hydronephrosis on either side. There is an extrarenal pelvis on each side, an anatomic variant. There is no appreciable renal or ureteral calculus on either side. Urinary bladder is midline. The urinary bladder wall is mildly thickened in a generalized manner. Stomach/Bowel: There is no appreciable bowel wall or mesenteric thickening. There is no evident bowel obstruction. There is no appreciable free air or portal venous air. Vascular/Lymphatic: There are foci of aortic and iliac artery atherosclerosis. No aneurysm evident. No adenopathy is appreciable in the abdomen or pelvis. Reproductive: Prostate and seminal vesicles are normal in size and contour. No pelvic masses evident. Other: The appendix appears normal. There is no abscess or ascites in the abdomen or pelvis. There is a small ventral hernia containing only fat. There is postoperative change in the left inguinal region with scarring and clips present. Musculoskeletal: There are old healed rib fractures on the left. No blastic or lytic bone lesions are evident. No intramuscular lesions are appreciable. IMPRESSION: 1. Thickening of the urinary bladder wall, a finding felt to indicate a degree of cystitis. No renal or ureteral calculi evident. No hydronephrosis. 2.  Right adrenal myelolipoma, a benign lesion. 3. No bowel obstruction. No abscess in the abdomen or pelvis. Appendix appears normal. 4.  Small ventral hernia containing only fat. 5.  Aortic Atherosclerosis (ICD10-I70.0). Electronically Signed   By: Lowella Grip III M.D.   On: 07/08/2018 14:02   Ct Head Wo Contrast  Result Date: 07/08/2018 CLINICAL DATA:   Altered level of consciousness EXAM: CT HEAD WITHOUT CONTRAST TECHNIQUE: Contiguous axial images were obtained from the base of the skull through  the vertex without intravenous contrast. COMPARISON:  May 22, 2018 FINDINGS: Brain: Ventricles are normal in size and configuration. There is borderline frontal atrophy bilaterally for age, stable. There is no appreciable intracranial mass, hemorrhage, extra-axial fluid collection, or midline shift. The brain parenchyma appears unremarkable. There is no demonstrable acute infarct. Vascular: There is no hyperdense vessel. There is no appreciable vascular calcification. Skull: The bony calvarium appears intact. Sinuses/Orbits: There is mild mucosal thickening in several ethmoid air cells. Other visualized paranasal sinuses are clear. Orbits appear symmetric bilaterally. Other: Mastoid air cells are clear. IMPRESSION: Slight frontal atrophy for age. Ventricles normal in size and configuration. Brain parenchyma appears unremarkable. No acute infarct. No mass or hemorrhage. Mild mucosal thickening in several ethmoid air cells. Electronically Signed   By: Lowella Grip III M.D.   On: 07/08/2018 13:57     Management plans discussed with the patient, family and they are in agreement.  CODE STATUS: Full Code   TOTAL TIME TAKING CARE OF THIS PATIENT: 45 minutes.    Berna Spare Casey Fye M.D on 07/09/2018 at 1:18 PM  Between 7am to 6pm - Pager - [phone]  After 6pm go to [email or web address] - Proofreader  [facility]  Office  [phone]  CC: Primary care physician; Caryl Never, MD   Note: This dictation was prepared with Dragon dictation along with smaller phrase technology. Any transcriptional errors that result from this process are unintentional.

## 2018-07-09 NOTE — Unmapped (Signed)
Will not refill meds at this time until after review hospital encounter at [facility].     I was the supervising physician in the delivery of the service. Kurtis Bushman, MD

## 2018-07-09 NOTE — Unmapped (Signed)
Spoke to Shawn Route Polhamus spouse  on July 09, 2018   Pt spouse  voiced that patient was admitted to [facility].Please let me know if I can do anything to help you.  Thank you.

## 2018-07-13 MED FILL — NITROGLYCERIN 0.4 MG SUBLINGUAL TABLET: 28 days supply | Qty: 100 | Fill #0 | Status: AC

## 2018-07-13 MED FILL — NITROGLYCERIN 0.4 MG SUBLINGUAL TABLET: SUBLINGUAL | 28 days supply | Qty: 100 | Fill #0

## 2018-07-13 MED FILL — RENVELA 800 MG TABLET: 30 days supply | Qty: 180 | Fill #2 | Status: AC

## 2018-07-13 MED FILL — VENTOLIN HFA 90 MCG/ACTUATION AEROSOL INHALER: 25 days supply | Qty: 18 | Fill #0 | Status: AC

## 2018-07-13 MED FILL — RENVELA 800 MG TABLET: 30 days supply | Qty: 180 | Fill #2

## 2018-07-14 NOTE — Unmapped (Signed)
Says he can't keep nothing on his stomach and keeps throwing up.  His legs are swollen and aching and he is getting so he can't move around as well.  Wants a call today at [phone].

## 2018-07-14 NOTE — Unmapped (Signed)
See telephone note.

## 2018-07-14 NOTE — Unmapped (Signed)
error 

## 2018-07-14 NOTE — Unmapped (Addendum)
CCM Follow-up Call    PCP Action:please advise:    pt requesting tramadol rx for leg and wrist pain.   pt requesting electric w/c rx.   pt requesting something for nausea. States gi cocktails helped  when he was inpatient. Currently takes nothing for nausea.     Pt requesting phone visit w/ Dr. Hulan Fess        CCM Action:f/u SW/FEMA, Provider    Follow up/Discuss next call: nausea, pain mngt, w/c/scooter update. SDOH    Pt and wife requesting a return call.  Pt c/o emesis.  Last threw up 2 hours ago at about 2pm, threw up my breakfast: eggs and apple that was eaten at about 10am  Pt does not feel food is digesting. Pt is still keeping some fluids and food down and having BMs, however per [name] probably not a BM every day.  Pt states, I feel extra weak today  FBG 150 today.  Bilateral legs , painful to touch, r. Leg/ r. Knee swollen. I know I've got gout, but the thing is when I have gout I can straighten my leg out and can't straighten my leg out right now.  Pt currently taking prescribed allopurinol 50 mg po QOD.  Pt states   Using crutches and walker at different locations, so still able to get around some, and out to car w/ crutches. Crutches Take weight off r. Leg. Takes tylenol prn. States last c/o CP was a couple of days ago, took nitro SL x2 and then felt fine. No c/o CP nor SOB today. [name] and pt state pt is taking all medications as prescribed. No refills needed.    Current w/c is too wide and difficult to operate. Pt states, r. Wrist bothering me from past IV and nerve damage and r. Leg hurts a lot. pt requesting an electric scooter rx to get around.    Pt and wife requesting [facility] SW to apply them to the current emergent [place]  FEMA Temporary housing program. -Housing for [employer].  Pt and wife state COVID19 pandemic , is the reason pt cannot receive proper treatment at home b/c daughter extremely phobic of germs and not allowing HH PT, OT, RN to visit them anymore.  If pt and wife had a place to live would be easier to have treatment plan.     Questions for provider:     pt requesting tramadol rx for leg and wrist pain.   pt requesting electric w/c.   pt requesting something for nausea. States gi cocktails helped  when he was inpatient. Currently takes nothing for nausea.   [name] states worried about pt's hyperkalemia returning   Pt requesting phone visit w/ Dr. Hulan Fess    CCM will f/u w/ Care Management , scheduling and Provider.

## 2018-07-15 ENCOUNTER — Institutional Professional Consult (permissible substitution): Admit: 2018-07-15 | Discharge: 2018-07-16 | Payer: MEDICARE

## 2018-07-15 DIAGNOSIS — N184 Chronic kidney disease, stage 4 (severe): Secondary | ICD-10-CM

## 2018-07-15 DIAGNOSIS — E875 Hyperkalemia: Secondary | ICD-10-CM

## 2018-07-15 DIAGNOSIS — I5042 Chronic combined systolic (congestive) and diastolic (congestive) heart failure: Secondary | ICD-10-CM

## 2018-07-15 DIAGNOSIS — K3184 Gastroparesis: Principal | ICD-10-CM

## 2018-07-15 DIAGNOSIS — R7989 Other specified abnormal findings of blood chemistry: Secondary | ICD-10-CM

## 2018-07-15 MED ORDER — ALLOPURINOL 100 MG TABLET
ORAL_TABLET | Freq: Every day | ORAL | 3 refills | 0.00000 days | Status: CP
Start: 2018-07-15 — End: 2019-07-15
  Filled 2018-07-19: qty 45, 90d supply, fill #0

## 2018-07-15 MED ORDER — SEMAGLUTIDE 0.25 MG OR 0.5 MG (2 MG/1.5 ML) SUBCUTANEOUS PEN INJECTOR
PEN_INJECTOR | SUBCUTANEOUS | 3 refills | 0 days | Status: CP
Start: 2018-07-15 — End: 2018-07-27

## 2018-07-15 NOTE — Unmapped (Signed)
Internal Medicine Phone Visit    This visit is conducted via telephone.    Contact Information  Person Contacted: Patient  Contact Phone number: [phone] (home)   Is there someone else in the room? Yes. What is your relationship? wife. Do you want this person here for the visit? yes.  Patient agreed to a phone visit    Jeremy Oliver is a 48 y.o. male  participating in a telephone encounter.    Reason for Call  Hospital follow-up    Subjective:  On day of presentation to hospital, was at home with wife.  Took BP 137-87. Sat up and felt light headed and dizzy.  BP taken again by wife and BP 87/50. Jeremy Oliver stated he didn't feel good and passed out.  Called 911.  Stated his chest started hurting when EMS there.  Was taken to [facility] and hospitalized.  Given slight elevation in troponin to ~0.07 was admitted.  Troponins were trended and remained stable    Remote monitoring by [facility] for BP and states that they are all good     Stating that more fatigued with exertion. Walked up 1 flight of stairs (10 steps) and is completely worn out    BG 150-200.  No low blood sugars.       I have reviewed the problem list, medications, and allergies and have updated/reconciled them if needed.    Objective:  Well sounding.  Otherwise PE deferred due to telephonic visit    Assessment & Plan:  1. Gastroparesis    2. Chronic heart failure with reduced ejection fraction and diastolic dysfunction (CMS-[name])    3. CKD (chronic kidney disease) stage 4, GFR 15-29 ml/min (CMS-[name])    4. Hyperkalemia    5. Elevated troponin      Gastroparesis  Ongoing issue with vomiting undigested. Placed order for gastric emptying study to be scheduled with appropriate. Discussed with patient and wife that this will likely be delayed due to COVID-19 pandemic.      Chronic HFrEF/HFpEF    CKD, Stage 4 and Hyperkalemia:  Based on labs at most recent hospitalization, Cr at baseline and K remains within normal range.  Given issues with hyperK, continue to check at least every 3 months.  Has established with [facility] in [place]     Elevated troponin:  Remained stable but abnormal at recent hospitalization.  Ongoing symptoms of fatigue with exertion with known CAD.  Could be related to his CAD.  F/u with cardiology as scheduled.        Return in about 4 weeks (around 08/12/2018).       I spent 25 minutes on the phone with the patient. I spent an additional 10 minutes on pre- and post-visit activities.     The patient was physically located in [place] or a state in which I am permitted to provide care. The patient understood that s/he Stamp incur co-pays and cost sharing, and agreed to the telemedicine visit. The visit was completed via phone and/or video, which was appropriate and reasonable under the circumstances given the patient's presentation at the time.    The patient has been advised of the potential risks and limitations of this mode of treatment (including, but not limited to, the absence of in-person examination) and has agreed to be treated using telemedicine. The patient's/patient's family's questions regarding telemedicine have been answered.     If the phone/video visit was completed in an ambulatory setting, the patient has also been advised to  contact their provider???s office for worsening conditions, and seek emergency medical treatment and/or call 911 if the patient deems either necessary.

## 2018-07-15 NOTE — Unmapped (Signed)
error 

## 2018-07-15 NOTE — Unmapped (Signed)
.  Previsit complete    Time spent on phone:     Reason for visit: Follow up, states difficulty in ambulation    Vitals signs, if any: not taking at home    .PAINSCORE: 6    Allergies reviewed: yes    Medication reviewed: yes    Refills needed, if any:     Travel Screening: completed    Falls Risk: completed    Hark (DV) Screening: completed    [name] reviewed and updated in Epic:  Completed     Questions / Concerns that need to be addressed:     HARK Screening  HARK Screening  Within the last year, have you been humiliated or emotionally abused in other ways by your partner or ex-partner?: No  Within the last year, have you been afraid of your partner or ex-partner?: No  Within the last year, have you been raped or forced to have any kind of sexual activity by your partner or ex-partner?: No  Within the last year, have you been kicked, hit, slapped, or otherwise physically hurt by your partner or ex-partner?: No    AUDIT       PHQ2       PHQ9          GAD7       COPD Assessment

## 2018-07-15 NOTE — Unmapped (Signed)
I was the supervising physician in the delivery of the service. Jeremy Knab D Pallas Wahlert, MD

## 2018-07-16 NOTE — Unmapped (Signed)
Error

## 2018-07-19 MED FILL — ALLOPURINOL 100 MG TABLET: 90 days supply | Qty: 45 | Fill #0 | Status: AC

## 2018-07-19 MED FILL — ACETAMINOPHEN 500 MG TABLET: 30 days supply | Qty: 180 | Fill #1

## 2018-07-19 MED FILL — ACETAMINOPHEN 500 MG TABLET: 30 days supply | Qty: 180 | Fill #1 | Status: AC

## 2018-07-19 MED FILL — NOVOLOG FLEXPEN U-100 INSULIN ASPART 100 UNIT/ML (3 ML) SUBCUTANEOUS: SUBCUTANEOUS | 34 days supply | Qty: 15 | Fill #3

## 2018-07-19 MED FILL — NOVOLOG FLEXPEN U-100 INSULIN ASPART 100 UNIT/ML (3 ML) SUBCUTANEOUS: 34 days supply | Qty: 15 | Fill #3 | Status: AC

## 2018-07-19 MED FILL — FLUOXETINE 20 MG CAPSULE: ORAL | 30 days supply | Qty: 90 | Fill #2

## 2018-07-19 MED FILL — FLUOXETINE 20 MG CAPSULE: 30 days supply | Qty: 90 | Fill #2 | Status: AC

## 2018-07-19 MED FILL — GABAPENTIN 300 MG CAPSULE: 30 days supply | Qty: 60 | Fill #0 | Status: AC

## 2018-07-20 ENCOUNTER — Institutional Professional Consult (permissible substitution): Admit: 2018-07-20 | Discharge: 2018-07-21 | Payer: MEDICARE

## 2018-07-20 DIAGNOSIS — I10 Essential (primary) hypertension: Secondary | ICD-10-CM

## 2018-07-20 DIAGNOSIS — N184 Chronic kidney disease, stage 4 (severe): Secondary | ICD-10-CM

## 2018-07-20 DIAGNOSIS — E1122 Type 2 diabetes mellitus with diabetic chronic kidney disease: Secondary | ICD-10-CM

## 2018-07-20 DIAGNOSIS — Z794 Long term (current) use of insulin: Secondary | ICD-10-CM

## 2018-07-20 NOTE — Unmapped (Signed)
I will have our dialysis educator contact you to schedule a phone visit.    Otherwise, I did not change any of your medicines today.

## 2018-07-20 NOTE — Unmapped (Signed)
CC[facility] intake call  Lynann Beaver [facility] -  telecare RN ph # [phone]    Will keep pt active w/ HH until [place] and pt is still being monitored with the tele health equipment. No active services are happening now b/c pt's daughter, Sonia Baller, allegedly will not allow anyone in the home d/t COVID19 pandemic.  Pt is    Recently in hospital, discharged last week.    Irving Burton RN calling to update PCP re:  Pt still has c/o emesis with exertion.  Has not lost any weight since 07/15/18, maintains 323 lbs. (06/22/18 wt @ office visit 341 lbs)    BP today 07/20/18: 154/69, 76  BP Sat. 07/17/18  : 124/65, 76    FBG 175    PCP Action:please advise:    pt requesting tramadol rx for leg and wrist pain-    pt requesting electric w/c rx. Not comfortable to make pt too sedentary.     pt requesting something for nausea. States gi

## 2018-07-20 NOTE — Unmapped (Addendum)
DIVISION OF NEPHROLOGY AND HYPERTENSION  [facility]  [address]  [place], [place] [address]         Date of Service: 07/20/2018      PCP: Referring Provider:   Kurtis Bushman, MD  [address] [place] [address]  [place] [address]  Phone: [phone]  Fax: [phone] Roma Schanz, MD  [address]  [place] [address] [place], [place] [address]  Phone: [phone]  Fax: [phone]       07/20/2018  I spent 30 minutes on the phone with the patient. I spent an additional 10 minutes on pre- and post-visit activities.     The patient was physically located in [place] or a state in which I am permitted to provide care. The patient understood that s/he Kirchoff incur co-pays and cost sharing, and agreed to the telemedicine visit. The visit was completed via phone and/or video, which was appropriate and reasonable under the circumstances given the patient's presentation at the time.    The patient has been advised of the potential risks and limitations of this mode of treatment (including, but not limited to, the absence of in-person examination) and has agreed to be treated using telemedicine. The patient's/patient's family's questions regarding telemedicine have been answered.     If the phone/video visit was completed in an ambulatory setting, the patient has also been advised to contact their provider???s office for worsening conditions, and seek emergency medical treatment and/or call 911 if the patient deems either necessary.    Chief Complaint: fu CKD    HPI:  Jeremy Oliver presents via phone for fu CKD. I last saw him 05/11/18. Today, he reports that he was discharged from [facility] about 2 weeks ago. He describes symptoms of nausea/vomiting when going up and down stairs.    Other than that he reports that he has not been experiencing SOB, CP, cough.     Although his appetite is down, he is drinking about a gallon of water a day. Denies metallic taste in mouth. No itching.    Allopurinol increased from qod to daily last week by PCP.    He has a friend who was on dialysis, so he has a little familiarity with it. His wife and he have looked over the materials I sent him. Currently living with daughter, so cannot pursue HT, but would be interested when they move to their own home.    ROS:   CONSTITUTIONAL: denies fevers or chills, denies unintentional weight loss  CARDIOVASCULAR: denies chest pain, denies dyspnea on exertion, endorses leg edema  GASTROINTESTINAL: endorses nausea and vomiting as per HPI, poor appetite  GENITOURINARY: denies dysuria, denies hematuria, denies decreased urinary stream  All other systems reviewed and are negative except as listed above.    PAST MEDICAL HISTORY:  Past Medical History:   Diagnosis Date   ??? Angina at rest (CMS-[name])    ??? Arthritis    ??? Asthma    ??? Atrial fibrillation and flutter (CMS-[name]) 06/08/14   ??? BMI 40.0-44.9, adult (CMS-[name]) 10/29/2015   ??? Chronic anticoagulation 02/17/2018   ??? Chronic kidney disease, stage 3 (CMS-[name])    ??? Chronic pain syndrome 04/11/2014    ~Use daily lyrica as recommended ~Tramadol as needed for pain ~Fluoxetine daily as recommended ~Daily exercise ~Eat a healthy diet ~Good control of your blood sugars can help    ???  Coronary artery disease    ??? Depression    ??? Diabetes mellitus (CMS-[name])    ??? Eye trauma 1998    glass in both eyes and removed   ??? GERD (gastroesophageal reflux disease)    ??? Gout    ??? Homeless    ??? Hyperlipidemia    ??? Hypertension    ??? Illiteracy and low-level literacy    ??? Microscopic hematuria    ??? Migraine    ??? Nephrolithiasis    ??? 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 Nonalcoholic fatty liver disease    ??? NSTEMI (non-ST elevated myocardial infarction) (CMS-[name]) 01/13/2017   ??? Obesity    ??? Obstructive sleep apnea 2009   ??? Panic attacks    ??? 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 Seizure-like activity (CMS-[name])     ~Monitor for symptoms and report them to your primary care provider if they occur    ??? Systolic CHF, chronic (CMS-[name])     EF 40-45% 2017   ??? TIA (transient ischemic attack)        PAST SURGICAL HISTORY:  Past Surgical History:   Procedure Laterality Date   ??? CARDIAC CATHETERIZATION  03/08/2007   ??? CYSTOURETHROSCOPY  12/31/2009     Microscopic hematuria   ??? KNEE SURGERY Right 09/23/2006    arthroscopic knee surgery medial and lateral meniscus tears as well as crystal disease   ??? PR CATH PLACE/CORON ANGIO, IMG SUPER/INTERP,R&L HRT CATH, L HRT VENTRIC N/A 02/13/2017    Procedure: Left/Right Heart Catheterization;  Surgeon: Marlaine Hind, MD;  Location: [facility] CATH;  Service: Cardiology   ??? PR CATH PLACE/CORON ANGIO, IMG SUPER/INTERP,W LEFT HEART VENTRICULOGRAPHY N/A 03/17/2017    Procedure: Left Heart Catheterization W Intervention;  Surgeon: Marlaine Hind, MD;  Location: [facility] CATH;  Service: Cardiology   ??? PR CATH PLACE/CORON ANGIO, IMG SUPER/INTERP,W LEFT HEART VENTRICULOGRAPHY N/A 05/12/2018    Procedure: CATH LEFT HEART CATHETERIZATION W INTERVENTION;  Surgeon: Dorathy Kinsman, MD;  Location: [facility] CATH;  Service: Cardiology   ??? PR ECMO/ECLS INITIATION VENO-ARTERIAL N/A 03/19/2017    Procedure: ECMO/ECLS; INITIATION, VENO-ARTERIAL;  Surgeon: Lennie Odor, MD;  Location: MAIN OR [facility];  Service: Cardiac Surgery   ??? PR ECMO/ECLS RMVL PRPH CANNULA OPEN 6 YRS & OLDER Left 03/25/2017    Procedure: ECMO / ECLS PROVIDED BY PHYSICIAN; REMOVAL OF PERIPHERAL (ARTERIAL AND/OR VENOUS) CANNULA(E), OPEN, 6 YEARS AND OLDER; Surgeon: Alonna Buckler Ikonomidis, MD;  Location: MAIN OR [facility];  Service: Cardiac Surgery   ??? PR EPHYS EVAL W/ ABLATION SUPRAVENT ARRHYTHMIA N/A 07/19/2015    Catheter ablation of cavotricuspid isthmus for atrial flutter [facility])   ??? PR INSER HART PACER XVENOUS ATRIAL N/A 05/30/2015    [employer] dual-chamber pacemaker implant (E.Chung)   ??? PR INSERT/PLACE FLOW DIRECT CATH N/A 03/17/2017    Procedure: Insert Leave In [place];  Surgeon: Marlaine Hind, MD;  Location: [facility] CATH;  Service: Cardiology   ??? PR INSJ/RPLCMT PERM DFB W/TRNSVNS LDS 1/DUAL CHMBR N/A 05/04/2017    Procedure: ICD Implant System (Single/Dual);  Surgeon:  Eldred Manges, MD;  Location: [facility] CATH;  Service: Cardiology   ??? PR NEGATIVE PRESSURE WOUND THERAPY DME >50 SQ CM Left 03/25/2017    Procedure: Neg Press Wound Tx (Vac Assist) Incl Topicals, Per Session, Tsa Greater Than/= 50 Cm Squared;  Surgeon: Alonna Buckler Ikonomidis, MD;  Location: MAIN OR [facility];  Service: Cardiac Surgery   ??? PR PRQ TRLUML CORONARY STENT W/ANGIO ONE ART/BRNCH N/A 03/12/2018    Procedure: Percutaneous Coronary Intervention;  Surgeon: Marlaine Hind, MD;  Location: [facility] CATH;  Service: Cardiology   ??? PR REBL VES DIRECT,LOW EXTREM Left 03/19/2017    Procedure: Repr Bld Vessel Direct; Lower Extrem;  Surgeon: Boykin Reaper, MD;  Location: MAIN OR [facility];  Service: Vascular   ??? PR REMV ART CLOT ILIAC-POP,LEG INCIS Left 03/25/2017    Procedure: EMBOLECTOMY OR THROMBECTOMY, WITH OR WITHOUT CATHETER; FEMOROPOPLITEAL, AORTOILIAC ARTERY, BY LEG INCISION;  Surgeon: Alonna Buckler Ikonomidis, MD;  Location: MAIN OR [facility];  Service: Cardiac Surgery   ??? PR UPPER GI ENDOSCOPY,BIOPSY N/A 02/28/2016    Procedure: UGI ENDOSCOPY; WITH BIOPSY, SINGLE OR MULTIPLE;  Surgeon: Liane Comber, MD;  [facility]: [facility];  Service: Gastroenterology   ??? PR UPPER GI ENDOSCOPY,DIAGNOSIS N/A 05/07/2016    Procedure: UGI ENDO, INCLUDE ESOPHAGUS, STOMACH, & DUODENUM &/OR JEJUNUM; DX W/WO COLLECTION SPECIMN, BY BRUSH OR WASH;  Surgeon: Modena Nunnery, MD;  Location: [facility];  Service: Gastroenterology       SOCIAL HISTORY:  Social History     Socioeconomic History   ??? Marital status: Married     Spouse name: [name]   ??? Number of children: 3   ??? Years of education: 69   ??? Highest education level: Not on file   Occupational History   ??? Occupation: DISABLED     Comment: Former Surveyor, quantity Needs   ??? Financial resource strain: Very hard   ??? Food insecurity     Worry: Not on file     Inability: Not on file   ??? Transportation needs     Medical: Yes     Non-medical: Yes   Tobacco Use   ??? Smoking status: Never Smoker   ??? Smokeless tobacco: Current User     Types: Chew   ??? Tobacco comment: Pt dips 1 can a week, Pt not interested in quitting    Substance and Sexual Activity   ??? Alcohol use: Never     Alcohol/week: 0.0 standard drinks     Frequency: Never     Comment: rare use: couple times a year, small amount   ??? Drug use: No   ??? Sexual activity: Yes     Partners: Female   Lifestyle   ??? Physical activity     Days per week: Not on file     Minutes per session: Not on file   ??? Stress: Not on file   Relationships   ??? Social Wellsite geologist on phone: More than three times a week     Gets together: More than three times a week     Attends religious service: 1 to 4 times per year     Active member of club or organization: No     Attends meetings of clubs or organizations: Never     Relationship status: Married   Other Topics Concern   ??? Do you use sunscreen? No   ??? Tanning bed use? No   ??? Are you  easily burned? No   ??? Excessive sun exposure? No   ??? Blistering sunburns? No   Social History Narrative    Information updated:  01/08/16. Reviewed 08/12/17:        Born in [place]    Raised with both parents    Parents died w/in 6 mos of each other. His parents knew they were going to pass and told him just before.    Half sister and 2 half brothers        Married 30 yrs. Wife [name]    2 other children son and daughter, adopted out    Daughter Bridgette, bf DJ        Education - completed through 11th. 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    Former Presenter, broadcasting since age 7    Lives with wife, difficulty paying bills 12/30/17     Lives next door to daughter, husband and their grandson        05/19/18: pt moving to new home in next 5 days- after an eviction. Has Medicaid assistance again. Will be w/ wife [name], they babysit their grandson often.    06/14/18: Pt and husband living with daughter while new location to live is determined.       FAMILY HISTORY:  Unchanged from previous visit.  Family History   Problem Relation Age of Onset   ??? Diabetes Mother    ??? Hyperlipidemia Mother    ??? Heart disease Mother    ??? Basal cell carcinoma Mother    ??? Blindness Mother         diabeties   ??? Obesity Mother    ??? Hyperlipidemia Father    ??? Heart disease Father    ??? Lung disease Father    ??? Heart disease Half-Sister    ??? Kidney disease Half-Sister    ??? Gallbladder disease Half-Brother    ??? Obesity Half-Brother    ??? No Known Problems Daughter    ??? No Known Problems Son    ??? Obesity Daughter    ??? Melanoma Neg Hx    ??? Squamous cell carcinoma Neg Hx    ??? Macular degeneration Neg Hx    ??? Glaucoma Neg Hx        ALLERGIES:  Losartan and Morphine    MEDICATIONS:  Current Outpatient Medications   Medication Sig Dispense Refill   ??? acetaminophen (TYLENOL) 500 MG tablet TAKE 2 TABLETS (1 000MG ) BY MOUTH EVERY 8 HOURS AS NEEDED FOR PAIN 180 each 3   ??? albuterol HFA 90 mcg/actuation inhaler Inhale 2 puffs into the lungs every 6 (six) hours as needed for wheezing or shortness of breath. 18 g 2   ??? alirocumab (PRALUENT) 150 mg/mL subcutaneous injection Inject 1 mL (150 mg total) under the skin every fourteen (14) days. 4 Syringe 11   ??? allopurinoL (ZYLOPRIM) 100 MG tablet Take 0.5 tablets (50 mg total) by mouth daily. 45 tablet 3   ??? amiodarone (PACERONE) 200 MG tablet TAKE 1 TABLET BY MOUTH ONCE DAILY 90 tablet 3   ??? arm brace Misc 1 each by Miscellaneous route daily. 1 each 0   ??? atorvastatin (LIPITOR) 80 MG tablet Take 1 tablet (80 mg total) by mouth daily. 90 tablet 3   ??? blood sugar diagnostic  Strp USE TO CHECK BLOOD SUGAR 4 TIMES DAILY (BEFORE MEALS AND NIGHTLY) 100 each 3   ??? blood-glucose meter kit Use to check blood sugars, #1 meter, #100 strips. Freestyle glucose meter. ICD-9 250.00 1 each 0   ??? blood-glucose sensor (FREESTYLE NAVIGATOR GLUC SENS) Devi Frequency:BID   Dosage:0.0     Instructions:  Note:Dose: 1     ??? bumetanide (BUMEX) 2 MG tablet TAKE 1 TABLET BY MOUTH ONCE DAILY 90 tablet 3   ??? carvediloL (COREG) 25 MG tablet TAKE 1 TABLET BY MOUTH WITH THE MORNING MEAL AND TAKE 2 TABLETS WITH THE EVENING MEAL 270 tablet 3   ??? cholecalciferol, vitamin D3, 5,000 unit capsule Take 1 capsule (5,000 Units total) by mouth daily. 100 capsule 0   ??? diclofenac sodium (VOLTAREN) 1 % gel Apply 2 grams topically Three (3) times a day as needed for pain. 100 g 0   ??? empty container Misc Use as directed to dispose of injectable medications 1 each 3   ??? ezetimibe (ZETIA) 10 mg tablet TAKE 1 TABLET BY MOUTH ONCE DAILY 90 each 3   ??? FLUoxetine (PROZAC) 20 MG capsule Take 3 capsules (60 mg total) by mouth nightly. 270 capsule 3   ??? gabapentin (NEURONTIN) 300 MG capsule Take 1 capsule (300 mg total) by mouth two (2) times a day. 60 capsule 0   ??? insulin ASPART (NOVOLOG FLEXPEN U-100 INSULIN) 100 unit/mL (3 mL) injection pen Inject 0.15 mL (15 Units total) under the skin Three (3) times a day with a meal. 15 mL 11   ??? insulin detemir U-100 (LEVEMIR) 100 unit/mL (3 mL) injection pen Inject 0.4 mL (40 Units total) under the skin nightly. 15 mL 3   ??? insulin syringe-needle U-100 0.3 mL 31 gauge x 5/16 Syrg Use to inject insulin twice daily with meals 60 each 0   ??? isosorbide mononitrate (IMDUR) 30 MG 24 hr tablet Take 1 tablet (30 mg total) by mouth daily. 90 tablet 3   ??? lancets 30 gauge Misc USE TO CHECK BLOOD SUGAR THREE TIMES DAILY WITH MEALS 250 each 2   ??? melatonin 3 mg cap Take 9 mg by mouth nightly.     ??? nitroglycerin (NITROSTAT) 0.4 MG SL tablet Place 1 tablet (0.4 mg total) under the tongue every five (5) minutes as needed for chest pain. 100 each 3   ??? omeprazole (PRILOSEC) 20 MG capsule Take 1 capsule (20 mg total) by mouth daily. 90 capsule 3   ??? oxyCODONE-acetaminophen (PERCOCET) 5-325 mg per tablet Take 1 tablet by mouth every eight (8) hours as needed. 12 tablet 0   ??? QUEtiapine (SEROQUEL) 50 MG tablet Take 1 tablet (50 mg total) by mouth two (2) times a day as needed (headache). 180 tablet 3   ??? rivaroxaban (XARELTO) 15 mg Tab Take 1 tablet (15 mg total) by mouth daily with evening meal. 90 tablet 3   ??? semaglutide 0.25 mg or 0.5 mg(2 mg/1.5 mL) PnIj Inject 0.5 mg under the skin every seven (7) days. For 4 weeks then increase to 0.5mg  every 7 days 3 pen 3   ??? sevelamer (RENVELA) 800 mg tablet TAKE 2 TABLETS (1600MG ) BY MOUTH THREE TIMES DAILY WITH MEALS 540 tablet 3   ??? ticagrelor (BRILINTA) 90 mg Tab TAKE 1 TABLET BY MOUTH TWICE DAILY 180 tablet 3   ??? traMADol (ULTRAM) 50 mg tablet Take 1 tablet (50 mg total) by mouth every six (6) hours as needed. 30 tablet 0   ???  UNIFINE PENTIPS 31 gauge x 5/16 Ndle USE WITH INSULIN AND VICTOZA 5 TIMES DAILY 400 each 3     No current facility-administered medications for this visit.        PHYSICAL EXAM:  There were no vitals filed for this visit.  There is no height or weight on file to calculate BMI.  No physical exam due to phone interview for COVID-19 restrictions.    BP Readings from Last 5 Encounters:   07/03/18 99/54   06/22/18 190/96   06/15/18 172/94   06/03/18 150/89   05/31/18 196/109       MEDICAL DECISION MAKING    Results for orders placed or performed during the hospital encounter of 07/02/18   Comprehensive Metabolic Panel   Result Value Ref Range Sodium 140 135 - 145 mmol/L    Potassium 4.6 3.5 - 5.0 mmol/L    Chloride 103 98 - 107 mmol/L    Anion Gap 15 7 - 15 mmol/L    CO2 22.0 22.0 - 30.0 mmol/L    BUN 30 (H) 7 - 21 mg/dL    Creatinine 1.61 (H) 0.70 - 1.30 mg/dL    BUN/Creatinine Ratio 8     EGFR CKD-EPI Non-African American, Male 19 (L) >=60 mL/min/1.59m2    EGFR CKD-EPI African American, Male 22 (L) >=60 mL/min/1.21m2    Glucose 154 65 - 179 mg/dL    Calcium 9.6 8.5 - 09.6 mg/dL    Albumin 3.5 3.5 - 5.0 g/dL    Total Protein 6.9 6.5 - 8.3 g/dL    Total Bilirubin 0.3 0.0 - 1.2 mg/dL    AST 11 (L) 19 - 55 U/L    ALT 9 <50 U/L    Alkaline Phosphatase 104 38 - 126 U/L   Troponin I   Result Value Ref Range    Troponin I 0.360 (HH) <0.060 ng/mL   Pro-BNP   Result Value Ref Range    PRO-BNP 4,560.0 (H) 0.0 - 138.0 pg/mL   PT-INR   Result Value Ref Range    PT 12.3 10.2 - 13.1 sec    INR 1.07    APTT   Result Value Ref Range    APTT 36.0 25.9 - 39.5 sec    Heparin Correlation 0.2    Lipase Level   Result Value Ref Range    Lipase 63 44 - 232 U/L   Troponin I   Result Value Ref Range    Troponin I 0.350 (HH) <0.060 ng/mL   Magnesium Level   Result Value Ref Range    Magnesium 1.7 1.6 - 2.2 mg/dL   Hemoglobin E4V   Result Value Ref Range    Hemoglobin A1C 9.5 (H) 4.8 - 5.6 %    Estimated Average Glucose 226 mg/dL   TSH   Result Value Ref Range    TSH 4.832 (H) 0.600 - 3.300 uIU/mL   Blood Gas Critical Care Panel, Venous   Result Value Ref Range    Specimen Source Venous     FIO2 Venous Room Air     pH, Venous 7.41 7.32 - 7.43    pCO2, Ven 36 (L) 40 - 60 mm Hg    pO2, Ven 47 30 - 55 mm Hg    HCO3, Ven 22 22 - 27 mmol/L    Base Excess, Ven -2.0 -2.0 - 2.0    O2 Saturation, Venous 80.7 40.0 - 85.0 %    Sodium Whole Blood 134 (L) 135 - 145 mmol/L  Potassium, Bld 4.1 3.4 - 4.6 mmol/L    Calcium, Ionized Venous 4.53 4.40 - 5.40 mg/dL    Glucose Whole Blood 182 (H) 70 - 179 mg/dL    Lactate, Venous 1.6 0.5 - 1.8 mmol/L    Hgb, blood gas 9.80 (L) 13.50 - 17.50 g/dL APTT   Result Value Ref Range    APTT 47.6 (H) 25.9 - 39.5 sec    Heparin Correlation 0.2    Basic metabolic panel   Result Value Ref Range    Sodium 136 135 - 145 mmol/L    Potassium 4.6 3.5 - 5.0 mmol/L    Chloride 103 98 - 107 mmol/L    CO2 21.0 (L) 22.0 - 30.0 mmol/L    Anion Gap 12 7 - 15 mmol/L    BUN 34 (H) 7 - 21 mg/dL    Creatinine 0.98 (H) 0.70 - 1.30 mg/dL    BUN/Creatinine Ratio 9     EGFR CKD-EPI Non-African American, Male 18 (L) >=60 mL/min/1.78m2    EGFR CKD-EPI African American, Male 21 (L) >=60 mL/min/1.56m2    Glucose 149 70 - 179 mg/dL    Calcium 8.5 8.5 - 11.9 mg/dL   Magnesium Level   Result Value Ref Range    Magnesium 1.6 1.6 - 2.2 mg/dL   CBC   Result Value Ref Range    WBC 12.3 (H) 4.5 - 11.0 10*9/L    RBC 3.57 (L) 4.50 - 5.90 10*12/L    HGB 9.5 (L) 13.5 - 17.5 g/dL    HCT 14.7 (L) 82.9 - 53.0 %    MCV 83.9 80.0 - 100.0 fL    MCH 26.7 26.0 - 34.0 pg    MCHC 31.9 31.0 - 37.0 g/dL    RDW 56.2 (H) 13.0 - 15.0 %    MPV 8.3 7.0 - 10.0 fL    Platelet 318 150 - 440 10*9/L   Urinalysis   Result Value Ref Range    Color, UA Light Yellow     Clarity, UA Hazy     Specific Gravity, UA 1.011 1.003 - 1.030    pH, UA 5.0 5.0 - 9.0    Leukocyte Esterase, UA Negative Negative    Nitrite, UA Negative Negative    Protein, UA 100 mg/dL (A) Negative    Glucose, UA 50 mg/dL (A) Negative    Ketones, UA Negative Negative    Urobilinogen, UA 0.2 mg/dL 0.2 mg/dL, 1.0 mg/dL    Bilirubin, UA Negative Negative    Blood, UA Small (A) Negative    RBC, UA 2 <=3 /HPF    WBC, UA 2 <=2 /HPF    Squam Epithel, UA <1 0 - 5 /HPF    Bacteria, UA Rare (A) None Seen /HPF    Hyaline Casts, UA 1 0 - 1 /LPF    Amorphous Crystal, UA Rare /HPF    Mucus, UA Rare (A) None Seen /HPF   Sodium, Random Urine   Result Value Ref Range    Sodium, Ur 27 Undefined mmol/L   Urea Nitrogen, Random Urine   Result Value Ref Range    Urea Nitrogen, Ur 264 Undefined mg/dL   Osmolality, Random Urine   Result Value Ref Range    Osmolality, Ur 247 mOsm/kg aPTT   Result Value Ref Range    APTT 32.2 25.9 - 39.5 sec    Heparin Correlation 0.2    Basic Metabolic Panel   Result Value Ref Range    Sodium 136 135 -  145 mmol/L    Potassium 4.8 3.5 - 5.0 mmol/L    Chloride 103 98 - 107 mmol/L    CO2 22.0 22.0 - 30.0 mmol/L    Anion Gap 11 7 - 15 mmol/L    BUN 37 (H) 7 - 21 mg/dL    Creatinine 8.11 (H) 0.70 - 1.30 mg/dL    BUN/Creatinine Ratio 9     EGFR CKD-EPI Non-African American, Male 16 (L) >=60 mL/min/1.35m2    EGFR CKD-EPI African American, Male 19 (L) >=60 mL/min/1.65m2    Glucose 134 70 - 179 mg/dL    Calcium 8.6 8.5 - 91.4 mg/dL   ECG 12 lead (Adult)   Result Value Ref Range    EKG Systolic BP  mmHg    EKG Diastolic BP  mmHg    EKG Ventricular Rate 95 BPM    EKG Atrial Rate 95 BPM    EKG P-R Interval 194 ms    EKG QRS Duration 90 ms    EKG Q-T Interval 390 ms    EKG QTC Calculation 490 ms    EKG Calculated P Axis 43 degrees    EKG Calculated R Axis -2 degrees    EKG Calculated T Axis 131 degrees    QTC Fredericia 454 ms   ECG 12 Lead   Result Value Ref Range    EKG Systolic BP  mmHg    EKG Diastolic BP  mmHg    EKG Ventricular Rate 88 BPM    EKG Atrial Rate 88 BPM    EKG P-R Interval 208 ms    EKG QRS Duration 90 ms    EKG Q-T Interval 410 ms    EKG QTC Calculation 496 ms    EKG Calculated P Axis 35 degrees    EKG Calculated R Axis  degrees    EKG Calculated T Axis 132 degrees    QTC Fredericia 466 ms   POCT Glucose   Result Value Ref Range    Glucose, POC 142 70 - 179 mg/dL   POCT Glucose   Result Value Ref Range    Glucose, POC 172 70 - 179 mg/dL   POCT Glucose   Result Value Ref Range    Glucose, POC 147 70 - 179 mg/dL   POCT Glucose   Result Value Ref Range    Glucose, POC 173 70 - 179 mg/dL   POCT Glucose   Result Value Ref Range    Glucose, POC 126 70 - 179 mg/dL   CBC w/ Differential   Result Value Ref Range    WBC 13.8 (H) 4.5 - 11.0 10*9/L    RBC 4.02 (L) 4.50 - 5.90 10*12/L    HGB 10.7 (L) 13.5 - 17.5 g/dL    HCT 78.2 (L) 95.6 - 53.0 %    MCV 82.3 80.0 - 100.0 fL    MCH 26.5 26.0 - 34.0 pg    MCHC 32.2 31.0 - 37.0 g/dL    RDW 21.3 (H) 08.6 - 15.0 %    MPV 7.1 7.0 - 10.0 fL    Platelet 345 150 - 440 10*9/L    Neutrophils % 79.1 %    Lymphocytes % 12.2 %    Monocytes % 5.2 %    Eosinophils % 1.7 %    Basophils % 0.3 %    Absolute Neutrophils 10.9 (H) 2.0 - 7.5 10*9/L    Absolute Lymphocytes 1.7 1.5 - 5.0 10*9/L    Absolute Monocytes 0.7 0.2 - 0.8 10*9/L  Absolute Eosinophils 0.2 0.0 - 0.4 10*9/L    Absolute Basophils 0.0 0.0 - 0.1 10*9/L    Large Unstained Cells 2 0 - 4 %     *Note: Due to a large number of results and/or encounters for the requested time period, some results have not been displayed. A complete set of results can be found in Results Review.        Lab Results   Component Value Date    NA 136 07/03/2018    K 4.8 07/03/2018    CL 103 07/03/2018    CO2 22.0 07/03/2018    BUN 37 (H) 07/03/2018    CREATININE 4.08 (H) 07/03/2018    CREATININE 3.75 (H) 07/03/2018    CREATININE 3.53 (H) 07/02/2018    CREATININE 3.08 (H) 06/17/2018    CREATININE 2.87 (H) 06/15/2018    CREATININE 3.09 (H) 06/15/2018    GFRAA 25 (L) 07/16/2017    GFRAA 38 (L) 07/01/2017    GFRAA 31 (L) 06/19/2017    GFRAA 24 (L) 05/18/2017    GFRAA 25 (L) 05/17/2017    GFRAA 25 (L) 05/16/2017    GFRNONAA 21 (L) 07/16/2017    GFRNONAA 31 (L) 07/01/2017    GFRNONAA 26 (L) 06/19/2017    GFRNONAA 20 (L) 05/18/2017    GFRNONAA 20 (L) 05/17/2017    GFRNONAA 20 (L) 05/16/2017     Lab Results   Component Value Date    PCRATIOUR 1.669 03/17/2017    ALBUMIN 3.5 07/02/2018    WBC 12.3 (H) 07/03/2018    PLT 318 07/03/2018     No components found for: LASTPTH  Lab Results   Component Value Date    WBC 12.3 (H) 07/03/2018    HGB 9.5 (L) 07/03/2018    HCT 29.9 (L) 07/03/2018    PLT 318 07/03/2018     From CareEverywhere  07/09/18  Na 135, K 4.3, Cl 105, CO2 21, BUN 34, Cr 3.88 (eGFR 17), glu 165  Hgb 8.3    Wt Readings from Last 3 Encounters:   07/02/18 (!) 151.2 kg (333 lb 4.8 oz)   06/22/18 (!) 154.7 kg (341 lb 0.8 oz)   05/31/18 (!) 153.2 kg (337 lb 11.9 oz)     ASSESSMENT/PLAN:  JeremySilvestre [name] Yeung is a 49 y.o. year old patient with   1. CKD (chronic kidney disease) stage 4, GFR 15-29 ml/min (CMS-[name])    2. Essential hypertension    3. Type 2 diabetes mellitus with stage 4 chronic kidney disease, with long-term current use of insulin (CMS-[name])      1. CKD IV (G4/A3). 2/2 longstanding and poorly controlled HTN and DM2. BL Cr 3-3.8. Last labs were at higher end of BL. KFRE 75%, 5-yr risk 99%.  - repeat with next labs  - Avoid nephrotoxins, including NSAIDs  - Will coordinate for a consultation with Altames[employer] [place], dialysis educator  - On discussion today, he believes at this time he would plan on pursuing dialysis if kidneys progressed. I emphasized the importance of shared decision making and that he can always change his mind. He has some interest in home therapies when they move to their own house (currently living with daughter).  - Due to co-morbidities, he is not a good transplant candidate as risks would outweigh benefits    2. HTN. Unable to check due to phone visit, but he is monitored remotely with BP, pulse ox and HR.  - Continue current regimen with Imdur 30 daily, carvedilol 25  qam, 50 qpm, bumex 2 daily    3. CKD-MBD. Check Phos and PTH with next labs  - Continue cholecalciferol 5000 U daily  - Continue sevelamer 1600mg  w meals    4. Anemia. %sat 19, ferritin 82, Hgb 8.3. He has tried oral iron in the past and causes him some nausea. He has received IV iron in hospital before, and he thinks he had some nausea related to it.  - Repeat iron studies and CBC with next labs  - If still low, consider iron infusion    5. Electrolytes. Sometimes has issues with hyperkalemia. Last levels were ok on d/c from hospital. CO2 in range.  - Repeat with next labs    6. Prevention. On a statin, zetia and PCSK9 inhibitor    7. DM2. On insulin and GLP-1 agonist. Managed by PCP.    8. Gout. Causes him problems with his legs. Allopurinol dose increased to 50mg  daily.  - Uric acid with next labs    JeremyJavyon [name] Idrovo will follow up in Return in about 3 months (around 10/19/2018) for Next scheduled follow up. or sooner as needed.

## 2018-07-21 NOTE — Unmapped (Signed)
Patient called in preparation for phone visit with Dr. Andrey Farmer    Time spent on visit: 15    Allergies reviewed :y    Pharmacy verified: y    Medications reviewed:y    Refills:0    Screening completed: Dietrich Pates    Questions/concerns addressed:     Pt. States has had 3 negative Covid19 tests. Pt. States having hard time walking.

## 2018-07-22 ENCOUNTER — Ambulatory Visit: Admit: 2018-07-22 | Discharge: 2018-07-22 | Disposition: A | Discharge status: Home / Self Care | Payer: MEDICARE

## 2018-07-22 ENCOUNTER — Emergency Department: Admit: 2018-07-22 | Discharge: 2018-07-22 | Disposition: A | Discharge status: Home / Self Care | Payer: MEDICARE

## 2018-07-22 ENCOUNTER — Institutional Professional Consult (permissible substitution)
Admit: 2018-07-22 | Discharge: 2018-07-23 | Payer: MEDICARE | Attending: Cardiovascular Disease | Primary: Cardiovascular Disease

## 2018-07-22 DIAGNOSIS — I480 Paroxysmal atrial fibrillation: Secondary | ICD-10-CM

## 2018-07-22 DIAGNOSIS — I10 Essential (primary) hypertension: Secondary | ICD-10-CM

## 2018-07-22 DIAGNOSIS — I5042 Chronic combined systolic (congestive) and diastolic (congestive) heart failure: Secondary | ICD-10-CM

## 2018-07-22 DIAGNOSIS — I2510 Atherosclerotic heart disease of native coronary artery without angina pectoris: Principal | ICD-10-CM

## 2018-07-22 DIAGNOSIS — R0600 Dyspnea, unspecified: Principal | ICD-10-CM

## 2018-07-22 LAB — CBC W/ AUTO DIFF
BASOPHILS ABSOLUTE COUNT: 0.1 10*9/L (ref 0.0–0.1)
BASOPHILS RELATIVE PERCENT: 0.4 %
EOSINOPHILS ABSOLUTE COUNT: 0.3 10*9/L (ref 0.0–0.4)
EOSINOPHILS RELATIVE PERCENT: 2.2 %
HEMATOCRIT: 29.6 % — ABNORMAL LOW (ref 41.0–53.0)
HEMOGLOBIN: 9.5 g/dL — ABNORMAL LOW (ref 13.5–17.5)
LARGE UNSTAINED CELLS: 2 % (ref 0–4)
LYMPHOCYTES ABSOLUTE COUNT: 1.9 10*9/L (ref 1.5–5.0)
MEAN CORPUSCULAR VOLUME: 81.6 fL (ref 80.0–100.0)
MEAN PLATELET VOLUME: 7.1 fL (ref 7.0–10.0)
MEAN PLATELET VOLUME: 7.1 fL — ABNORMAL LOW (ref 7.0–10.0)
MONOCYTES ABSOLUTE COUNT: 0.6 10*9/L (ref 0.2–0.8)
MONOCYTES RELATIVE PERCENT: 4.9 %
NEUTROPHILS ABSOLUTE COUNT: 9.1 10*9/L — ABNORMAL HIGH (ref 2.0–7.5)
NEUTROPHILS RELATIVE PERCENT: 75 %
PLATELET COUNT: 415 10*9/L (ref 150–440)
RED BLOOD CELL COUNT: 3.63 10*12/L — ABNORMAL LOW (ref 4.50–5.90)
RED CELL DISTRIBUTION WIDTH: 15.2 % — ABNORMAL HIGH (ref 12.0–15.0)
WBC ADJUSTED: 12.1 10*9/L — ABNORMAL HIGH (ref 4.5–11.0)

## 2018-07-22 LAB — COMPREHENSIVE METABOLIC PANEL
ALBUMIN: 3.4 g/dL — ABNORMAL LOW (ref 3.5–5.0)
ALKALINE PHOSPHATASE: 84 U/L (ref 38–126)
ALT (SGPT): 9 U/L (ref <50)
ANION GAP: 15 mmol/L (ref 7–15)
BLOOD UREA NITROGEN: 42 mg/dL — ABNORMAL HIGH (ref 7–21)
BUN / CREAT RATIO: 12
CALCIUM: 9 mg/dL (ref 8.5–10.2)
CHLORIDE: 105 mmol/L (ref 98–107)
CO2: 19 mmol/L — ABNORMAL LOW (ref 22.0–30.0)
CREATININE: 3.45 mg/dL — ABNORMAL HIGH (ref 0.70–1.30)
EGFR CKD-EPI AA MALE: 23 mL/min/{1.73_m2} — ABNORMAL LOW (ref >=60)
EGFR CKD-EPI NON-AA MALE: 20 mL/min/{1.73_m2} — ABNORMAL LOW (ref >=60)
GLUCOSE RANDOM: 181 mg/dL — ABNORMAL HIGH (ref 65–179)
POTASSIUM: 5.4 mmol/L — ABNORMAL HIGH (ref 3.5–5.0)
POTASSIUM: 5.4 mmol/L — ABNORMAL HIGH (ref 3.5–5.0)
PROTEIN TOTAL: 6.9 g/dL (ref 6.5–8.3)
SODIUM: 139 mmol/L (ref 135–145)

## 2018-07-22 LAB — MAGNESIUM: MAGNESIUM: 1.9 mg/dL (ref 1.6–2.2)

## 2018-07-22 MED ORDER — BUMETANIDE 2 MG TABLET
ORAL_TABLET | Freq: Two times a day (BID) | ORAL | 0 refills | 0 days | Status: CP
Start: 2018-07-22 — End: 2018-08-11
  Filled 2018-07-23: qty 30, 23d supply, fill #0

## 2018-07-22 NOTE — Unmapped (Signed)
Pt was sent by PCP for possible fluid around heart. Pt was sent for chest xray and labs per pt.

## 2018-07-22 NOTE — Unmapped (Signed)
Spoke with Jeremy Oliver, 47 y.o., for treatment of tobacco use/dependence.    This patient was not seen in person today. The Tobacco Treatment consult service has moved to a phone-based model to minimize potential spread of COVID-19. During this time, we will be limiting all person-to-person contact.    SUMMARY: Sw utilized Motivational Interviewing techniques to assess Pt for treatment of tobacco use. Pt reports chewing 1 can of smokeless tobacco a week and is not interested in becoming tobacco-free. Sw encouraged Pt to think of benefits of cessation related to  heart health, circulation, and healing. During counseling call Pt denied experience with cravings and declined NRT during hospital stay. SW elicited motivation and helped pt identify related triggers, strategies, and resources. Sw encouraged Pt to be mindful of triggers and to enact oral strategies that incorporate the use of toothpicks, drinking straws, hard candy or lollipops to assist in alleviating cravings. SW provided pt with information on physical improvements related to tobacco cessation, and available resources.     Tobacco Use Treatment  Program: Hospital Inpatient  Type of Visit: Initial  Tobacco Use Treatment Visit: Talked with patient  Goals Of Session: Assessment, Social skills development, Communication of feelings, Problem-solving skill development, Risk behaviors, modulate, Relapse prevention skills training, Communication skills, improve    Tobacco Use During Past 30 Days  Time Since Last Tobacco Use: smoked a cigarette today (at least one puff)  Tobacco Withdrawal (Past 24 Hours): None noted  Type of Tobacco Products Used: Smokeless  Quantity Used: 1  Quantity Per: week    Tobacco Use History  Brand of Tobacco Used: Longhorn  Longest Time Without Tobacco: Months  # of Hours, Days, Weeks, Months or Years: 1  Most Recent Attempt: 1-5 years  Medications Used in Past Attempts: Nicotine Gum  Previously seen by NDP?: Hospital Inpatient Behavioral Assessment  Why Uses: 1. Pt enjoys chewing dip 2. Habit 3. Stress-relief/calm   Barriers/Challenges: 1. Long-standing habit 2. Tobacco use ingrained in daily routines/activities   Strategies: 1. Sw provided education to be mindful of triggers and to enact oral strategies that incorporate the use of toothpicks, drinking straws, hard candy or lollipops to assist in alleviating cravings     Treatment Plan  Cessation Meds Currently Using: None  Medications Recommended During Hospitalization: Patient declines  Outpatient/Discharge Medications Recommended: Patient declines  Patient's Plan Post Discharge/Visit: Does not plan to quit in next 6 months    TTS Information  Diagnosis: Tobacco use disorder, unspecified, uncomplicated (F17.200)  Interventions: Assessed, Behavioral techniques, Discussed, Motivational interviewing, Informed, Suggested, Taught, Encouraged, Supportive therapy, Psycho-education  TTS Visit Length: >10 minutes      Donita Brooks, MSW, LCSW, NCTTP  Clinical Social Worker/ Tobacco Treatment Specialist  Inpatient Tobacco Treatment Program   Phone: [phone]  Pager: [phone]

## 2018-07-22 NOTE — Unmapped (Signed)
July 22, 2018    PCP:  Kurtis Bushman, MD    Patient ID: Jeremy Route Rohwer is a 48 y.o. male.    Assessment and Plan:   Mr. Salva is feeling extremely weak today and has been unable to walk unassisted for the last several weeks.  His wife is extremely concerned about the recent decline in his functional status and he appears to have more evidence of volume overload based on their description over the telephone today.  He also needs additional labs due to his worsening renal failure recently and I am concerned that he Cremer require the initiation of dialysis sooner rather than later.  Because of his extreme weakness and dyspnea concerning for acute worsening of his CHF,  I suggested they go to the emergency department at [facility] for some basic laboratory studies, chest x-ray and physical examination.  I suspect he will require admission to the hospital but if his laboratory studies are reassuring and there is no evidence of acute heart failure, hopefully we can arrange for close outpatient follow-up.    1. Coronary artery disease involving native coronary artery of native heart without angina pectoris  2. Chronic heart failure with reduced ejection fraction and diastolic dysfunction (CMS-[name])  3.  Paroxysmal atrial fibrillation (CMS-[name])  4. Essential hypertension (RAF-[name])    I spent 15 minutes on the phone with the patient. I spent an additional 10 minutes on pre- and post-visit activities.     The patient was physically located in [place] or a state in which I am permitted to provide care. The patient understood that s/he [place] incur co-pays and cost sharing, and agreed to the telemedicine visit. The visit was completed via phone and/or video, which was appropriate and reasonable under the circumstances given the patient's presentation at the time.    The patient has been advised of the potential risks and limitations of this mode of treatment (including, but not limited to, the absence of in-person examination) and has agreed to be treated using telemedicine. The patient's/patient's family's questions regarding telemedicine have been answered.     If the phone/video visit was completed in an ambulatory setting, the patient has also been advised to contact their provider???s office for worsening conditions, and seek emergency medical treatment and/or call 911 if the patient deems either necessary.      No follow-ups on file.    Elpidio Anis MD [facility]  [facility]   Division of Cardiology  [phone] pgr    Dictated using Dragon software, please excuse typos    Subjective:      Jeremy Oliver was evaluated by phone visit today.  He is living with his daughter in the upstairs bedroom of her house with his wife.  He has been extremely limited in his functional capacity recently and is now unable to walk without assistance.  Several weeks ago he was ambulating without assistance but now he requires crutches due to severe leg weakness, leg pain, and shortness of breath.  His wife has also noted more swelling in his legs especially when he is up on his feet during the day.  He spends most of the time in the bedroom is unable to leave the house recently due to worsening debility.  He denies any syncope, falls or injuries.  He denies any chest pain.  He denies any recent bleeding events.    Patient Active Problem List  Diagnosis   ??? CAD (coronary artery disease) (RAF-[name])   ??? Idiopathic chronic gout of left knee   ??? Hypercholesteremia   ??? Essential hypertension (RAF-[name])   ??? 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 Osteoarthritis   ??? Chronic nonalcoholic liver disease   ??? GERD (gastroesophageal reflux disease)   ??? Mild intermittent asthma without complication   ??? Chronic pain syndrome   ??? Paroxysmal atrial fibrillation (CMS-[name])   ??? OSA (obstructive sleep apnea)   ??? Tobacco use disorder   ??? Status post cardiac pacemaker procedure   ??? Diabetes mellitus with gastroparesis (CMS-[name])   ??? Cardiac pacemaker in situ   ??? Recurrent major depressive disorder, in full remission (CMS-[name])   ??? Adjustment disorder, unspecified   ??? Chronic heart failure with reduced ejection fraction and diastolic dysfunction (CMS-[name])   ??? Hyperkalemia   ??? CKD (chronic kidney disease) stage 4, GFR 15-29 ml/min (CMS-[name])   ??? Acquired left foot drop   ??? Decreased sensation of lower extremity, left   ??? Chronic anticoagulation   ??? Elevated troponin       Current Outpatient Medications   Medication Sig Dispense Refill   ??? acetaminophen (TYLENOL) 500 MG tablet TAKE 2 TABLETS (1 000MG ) BY MOUTH EVERY 8 HOURS AS NEEDED FOR PAIN 180 each 3   ??? albuterol HFA 90 mcg/actuation inhaler Inhale 2 puffs into the lungs every 6 (six) hours as needed for wheezing or shortness of breath. 18 g 2   ??? alirocumab (PRALUENT) 150 mg/mL subcutaneous injection Inject 1 mL (150 mg total) under the skin every fourteen (14) days. 4 Syringe 11   ??? allopurinoL (ZYLOPRIM) 100 MG tablet Take 0.5 tablets (50 mg total) by mouth daily. 45 tablet 3   ??? amiodarone (PACERONE) 200 MG tablet TAKE 1 TABLET BY MOUTH ONCE DAILY 90 tablet 3   ??? atorvastatin (LIPITOR) 80 MG tablet Take 1 tablet (80 mg total) by mouth daily. 90 tablet 3   ??? blood sugar diagnostic Strp USE TO CHECK BLOOD SUGAR 4 TIMES DAILY (BEFORE MEALS AND NIGHTLY) 100 each 3   ??? blood-glucose meter kit Use to check blood sugars, #1 meter, #100 strips. Freestyle glucose meter. ICD-9 250.00 1 each 0   ??? blood-glucose sensor (FREESTYLE NAVIGATOR GLUC SENS) Devi Frequency:BID   Dosage:0.0     Instructions:  Note:Dose: 1     ??? bumetanide (BUMEX) 2 MG tablet TAKE 1 TABLET BY MOUTH ONCE DAILY 90 tablet 3   ??? carvediloL (COREG) 25 MG tablet TAKE 1 TABLET BY MOUTH WITH THE MORNING MEAL AND TAKE 2 TABLETS WITH THE EVENING MEAL 270 tablet 3   ??? cholecalciferol, vitamin D3, 5,000 unit capsule Take 1 capsule (5,000 Units total) by mouth daily. 100 capsule 0   ??? diclofenac sodium (VOLTAREN) 1 % gel Apply 2 grams topically Three (3) times a day as needed for pain. 100 g 0   ??? ezetimibe (ZETIA) 10 mg tablet TAKE 1 TABLET BY MOUTH ONCE DAILY 90 each 3   ??? FLUoxetine (PROZAC) 20 MG capsule Take 3 capsules (60 mg total) by mouth nightly. 270 capsule 3   ??? gabapentin (NEURONTIN) 300 MG capsule Take 1 capsule (300 mg total) by mouth two (2) times a day. 60 capsule 0   ??? insulin ASPART (NOVOLOG FLEXPEN U-100 INSULIN) 100 unit/mL (3 mL) injection pen Inject 0.15 mL (15 Units total) under the skin Three (3) times  a day with a meal. 15 mL 11   ??? insulin detemir U-100 (LEVEMIR) 100 unit/mL (3 mL) injection pen Inject 0.4 mL (40 Units total) under the skin nightly. 15 mL 3   ??? insulin syringe-needle U-100 0.3 mL 31 gauge x 5/16 Syrg Use to inject insulin twice daily with meals 60 each 0   ??? isosorbide mononitrate (IMDUR) 30 MG 24 hr tablet Take 1 tablet (30 mg total) by mouth daily. 90 tablet 3   ??? lancets 30 gauge Misc USE TO CHECK BLOOD SUGAR THREE TIMES DAILY WITH MEALS 250 each 2   ??? melatonin 3 mg cap Take 9 mg by mouth nightly.     ??? nitroglycerin (NITROSTAT) 0.4 MG SL tablet Place 1 tablet (0.4 mg total) under the tongue every five (5) minutes as needed for chest pain. 100 each 3   ??? omeprazole (PRILOSEC) 20 MG capsule Take 1 capsule (20 mg total) by mouth daily. 90 capsule 3   ??? oxyCODONE-acetaminophen (PERCOCET) 5-325 mg per tablet Take 1 tablet by mouth every eight (8) hours as needed. 12 tablet 0   ??? QUEtiapine (SEROQUEL) 50 MG tablet Take 1 tablet (50 mg total) by mouth two (2) times a day as needed (headache). 180 tablet 3   ??? rivaroxaban (XARELTO) 15 mg Tab Take 1 tablet (15 mg total) by mouth daily with evening meal. 90 tablet 3   ??? semaglutide 0.25 mg or 0.5 mg(2 mg/1.5 mL) PnIj Inject 0.5 mg under the skin every seven (7) days. For 4 weeks then increase to 0.5mg  every 7 days 3 pen 3   ??? sevelamer (RENVELA) 800 mg tablet TAKE 2 TABLETS (1600MG ) BY MOUTH THREE TIMES DAILY WITH MEALS 540 tablet 3   ??? ticagrelor (BRILINTA) 90 mg Tab TAKE 1 TABLET BY MOUTH TWICE DAILY 180 tablet 3   ??? traMADol (ULTRAM) 50 mg tablet Take 1 tablet (50 mg total) by mouth every six (6) hours as needed. 30 tablet 0   ??? UNIFINE PENTIPS 31 gauge x 5/16 Ndle USE WITH INSULIN AND VICTOZA 5 TIMES DAILY 400 each 3   ??? arm brace Misc 1 each by Miscellaneous route daily. 1 each 0   ??? empty container Misc Use as directed to dispose of injectable medications 1 each 3     No current facility-administered medications for this visit.        Allergies   Allergen Reactions   ??? Losartan      Hyperkalemia (K>6)   ??? Morphine Palpitations     Medication error: was administered 20mL instead of 2mL       Review of Systems  Pertinent items are noted in HPI.      Objective:      There were no vitals filed for this visit.There is no height or weight on file to calculate BMI.    Wt Readings from Last 6 Encounters:   07/02/18 (!) 151.2 kg (333 lb 4.8 oz)   06/22/18 (!) 154.7 kg (341 lb 0.8 oz)   05/31/18 (!) 153.2 kg (337 lb 11.9 oz)   05/31/18 (!) 152.2 kg (335 lb 8.6 oz)   05/28/18 (!) 151 kg (333 lb)   05/22/18 (!) 133.5 kg (294 lb 5 oz)     Phone visit only - no exam    Please excuse typos, dictation completed via Sales executive software      _____________________________________________________________    Elpidio Anis  MD [facility]  Division of Cardiology  [employer] office [phone]  Pager [phone]

## 2018-07-22 NOTE — Unmapped (Addendum)
Emergency Department Provider Note        ED Clinical Impression     Final diagnoses:   None     ED Assessment/Plan     Jeremy Oliver is a 48 y.o. male with a PMH of HTN, CAD w/ prior NSTEMI (2018) complicated by cardiogenic shock following VT arrest with emergent PCI to mid LAD, ischemic cardiomyopathy with ICD, stage IV CKD, paroxysmal a. fib s/p pacmaker (on Xarelto), HTN, HLD, T2DM, CHF (last EF 45% in March 2020, on Bumex), and morbid obesity who was referred by cardiologist for progressively worsening generalized weakness and dyspnea on exertion.    Patient with high pretest probability for chronic CHF exacerbation, with multiple similar admissions in the past.  Do not feel that this represents PE, given subacute nature of it and lack of chest pain.  His mobility has decreased significantly, but on examination he has no significant lower extremity edema.  On bedside echo, there are no B-lines and there is no pericaridal effusion. Will obtain labs and CXR.    11:26 AM  On labs, his BMP is actually improved since priors, with an improvement in Cr.  K is slightly elevated but he is urinating freely, EKG normal.  CXR is actually clear which is consistent with no B lines.  CBC stable.  His troponin is also negative this time (had a small NSTEMI 20d ago).    K should decrease with IV lasix dose -- will give 80mg  IV which is what he got last time on admission and is double his torsemide equivalent.      1:30 PM  Patient's BNP is also improved.  Given overall improvement in his labs, and the existing dose of lasix 80mg  IV that he was already given, I do not feel that an inpatient admission is indicated at this time.  The above medical decision making was discussed with the patient; he expressed understanding and was in agreement with this assessment and plan.    Of note patient does say that his gout has been acting up in his L knee and believes that that is why he is less mobile than usual.      I have paged Dr. Andrey Farmer (I wanted to discuss whether he should increase his torsemide dose for a period of time) however patient and his wife [name] and they said that they will try to reach Dr. Andrey Farmer on their own and do not want to wait any longer if the ED workup is complete.    I will message Dr. Andrey Farmer on Epic and discharge patient at this time.     1:45 PM  Spoke with Dr. Andrey Farmer.  He recommends increase in Bumex to 2mg  BID x one week and weigh himself at home, and the clinic will contact him in a week.  Communicated this with the patient.      The above clinical impression, medical decision making, and treatment plan were discussed with the patient and wife over the phone, who all expressed understanding.  We have discussed anticipatory guidance, proposed follow-up, and careful return precautions; they expressed understanding and are comfortable with the discharge plan.  I provided an opportunity and answered any questions.         History     Chief Complaint   Patient presents with   ??? Abnormal Lab     HPI   Jeremy Oliver is a 48 y.o. male with a PMH of HTN, CAD w/ prior NSTEMI (2018) complicated  by cardiogenic shock following VT arrest with emergent PCI to mid LAD, ischemic cardiomyopathy with ICD, stage IV CKD, paroxysmal a. fib s/p pacmaker (on Xarelto), HTN, HLD, T2DM, CHF (last EF 45% in March 2020, on Bumex), and morbid obesity who was referred by cardiologist for progressively worsening generalized weakness and dyspnea on exertion.     The patient endorses several weeks of progressively worsening generalized weakness and dyspnea on exertion. Patient states that he has now gotten to the point where he is unable to walk to his bathroom without feeling fatigued and short of breath. His mobility is also limited 2/2 drop foot on his left leg and chronic right knee pain. He reports worsening of his chronic BLE edema, especially when he stands. No abdominal swelling, chest pain, or hemoptysis. Patient states that he is urinating more 2/2 drinking more water. No recent changes to his Bumex 2 mg QD. He is unsure if he has gained any weight (here, his weight is up 7lbs in 20d).    The patient denies any fevers, chills, cough, chest pain, muscle aches, loss of smell, recent out-of-state travel in the past 1 month, sick contacts or contacts with confirmed   Covid-19.     Patient called his cardiologist today after he noted particularly worsening weakness. He advised to come to ED for evaluation of acute-on-chronic CHF vs worsening renal failure. Per cardiologist note, if his laboratory studies are reassuring and there is no evidence of acute heart failure, hopefully we can arrange for close outpatient follow-up.      Measured weight today: 154.5kg (340lb 11oz)    Wt Readings from Last 6 Encounters:   07/02/18 (!) 151.2 kg (333 lb 4.8 oz)   06/22/18 (!) 154.7 kg (341 lb 0.8 oz)   05/31/18 (!) 153.2 kg (337 lb 11.9 oz)   05/31/18 (!) 152.2 kg (335 lb 8.6 oz)   05/28/18 (!) 151 kg (333 lb)   05/22/18 (!) 133.5 kg (294 lb 5 oz)         Past Medical History:   Diagnosis Date   ??? Angina at rest (CMS-[name])    ??? Arthritis    ??? Asthma    ??? Atrial fibrillation and flutter (CMS-[name]) 06/08/14   ??? BMI 40.0-44.9, adult (CMS-[name]) 10/29/2015   ??? Chronic anticoagulation 02/17/2018   ??? Chronic kidney disease, stage 3 (CMS-[name])    ??? Chronic pain syndrome 04/11/2014    ~Use daily lyrica as recommended ~Tramadol as needed for pain ~Fluoxetine daily as recommended ~Daily exercise ~Eat a healthy diet ~Good control of your blood sugars can help    ??? Coronary artery disease    ??? Depression    ??? Diabetes mellitus (CMS-[name])    ??? Eye trauma 1998    glass in both eyes and removed   ??? GERD (gastroesophageal reflux disease)    ??? Gout    ??? Homeless    ??? Hyperlipidemia    ??? Hypertension    ??? Illiteracy and low-level literacy    ??? Microscopic hematuria    ??? Migraine    ??? Nephrolithiasis    ??? 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 Nonalcoholic fatty liver disease    ??? NSTEMI (non-ST elevated myocardial infarction) (CMS-[name]) 01/13/2017   ??? Obesity    ??? Obstructive sleep apnea 2009   ??? Panic attacks    ??? 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 Seizure-like activity (CMS-[name])     ~Monitor for symptoms and report them to your primary care provider if they occur    ??? Systolic CHF, chronic (CMS-[name])     EF 40-45% 2017   ??? TIA (transient ischemic attack)        Past Surgical History:   Procedure Laterality Date   ??? CARDIAC CATHETERIZATION  03/08/2007   ??? CYSTOURETHROSCOPY  12/31/2009     Microscopic hematuria   ??? KNEE SURGERY Right 09/23/2006    arthroscopic knee surgery medial and lateral meniscus tears as well as crystal disease   ??? PR CATH PLACE/CORON ANGIO, IMG SUPER/INTERP,R&L HRT CATH, L HRT VENTRIC N/A 02/13/2017    Procedure: Left/Right Heart Catheterization;  Surgeon: Marlaine Hind, MD;  Location: [facility] CATH;  Service: Cardiology   ??? PR CATH PLACE/CORON ANGIO, IMG SUPER/INTERP,W LEFT HEART VENTRICULOGRAPHY N/A 03/17/2017    Procedure: Left Heart Catheterization W Intervention;  Surgeon: Marlaine Hind, MD;  Location: [facility] CATH;  Service: Cardiology   ??? PR CATH PLACE/CORON ANGIO, IMG SUPER/INTERP,W LEFT HEART VENTRICULOGRAPHY N/A 05/12/2018    Procedure: CATH LEFT HEART CATHETERIZATION W INTERVENTION;  Surgeon: Dorathy Kinsman, MD;  Location: [facility] CATH;  Service: Cardiology   ??? PR ECMO/ECLS INITIATION VENO-ARTERIAL N/A 03/19/2017    Procedure: ECMO/ECLS; INITIATION, VENO-ARTERIAL;  Surgeon: Lennie Odor, MD;  Location: MAIN OR [facility]; Service: Cardiac Surgery   ??? PR ECMO/ECLS RMVL PRPH CANNULA OPEN 6 YRS & OLDER Left 03/25/2017    Procedure: ECMO / ECLS PROVIDED BY PHYSICIAN; REMOVAL OF PERIPHERAL (ARTERIAL AND/OR VENOUS) CANNULA(E), OPEN, 6 YEARS AND OLDER;  Surgeon: Alonna Buckler Ikonomidis, MD;  Location: MAIN [facility];  Service: Cardiac Surgery   ??? PR EPHYS EVAL W/ ABLATION SUPRAVENT ARRHYTHMIA N/A 07/19/2015    Catheter ablation of cavotricuspid isthmus for atrial flutter [facility])   ??? PR INSER HART PACER XVENOUS ATRIAL N/A 05/30/2015    [employer] dual-chamber pacemaker implant (E.Chung)   ??? PR INSERT/PLACE FLOW DIRECT CATH N/A 03/17/2017    Procedure: Insert Leave In [place];  Surgeon: Marlaine Hind, MD;  Location[facility] CATH;  Service: Cardiology   ??? PR INSJ/RPLCMT PERM DFB W/TRNSVNS LDS 1/DUAL CHMBR N/A 05/04/2017    Procedure: ICD Implant System (Single/Dual);  Surgeon: Eldred Manges, MD;  Location: [facility] CATH;  Service: Cardiology   ??? PR NEGATIVE PRESSURE WOUND THERAPY DME >50 SQ CM Left 03/25/2017    Procedure: Neg Press Wound Tx (Vac Assist) Incl Topicals, Per Session, Tsa Greater Than/= 50 Cm Squared;  Surgeon: Alonna Buckler Ikonomidis, MD;  Location: MAIN OR [facility];  Service: Cardiac Surgery   ??? PR PRQ TRLUML CORONARY STENT W/ANGIO ONE ART/BRNCH N/A 03/12/2018    Procedure: Percutaneous Coronary Intervention;  Surgeon: Marlaine Hind, MD;  Location: [facility] CATH;  Service: Cardiology   ??? PR REBL VES DIRECT,LOW EXTREM Left 03/19/2017    Procedure: Repr Bld Vessel Direct; Lower Extrem;  Surgeon: Boykin Reaper, MD;  Location: MAIN OR [facility];  Service: Vascular   ??? PR REMV ART CLOT ILIAC-POP,LEG INCIS Left 03/25/2017    Procedure: EMBOLECTOMY OR THROMBECTOMY, WITH OR WITHOUT CATHETER; FEMOROPOPLITEAL, AORTOILIAC ARTERY, BY LEG INCISION;  Surgeon:  John Sotirios Ikonomidis, MD;  Location: MAIN OR [facility];  Service: Cardiac Surgery   ??? PR UPPER GI ENDOSCOPY,BIOPSY N/A 02/28/2016    Procedure: UGI ENDOSCOPY; WITH BIOPSY, SINGLE OR MULTIPLE;  Surgeon: Liane Comber, MD;  [facility];  Service: Gastroenterology   ??? PR UPPER GI ENDOSCOPY,DIAGNOSIS N/A 05/07/2016    Procedure: UGI ENDO, INCLUDE ESOPHAGUS, STOMACH, & DUODENUM &/OR JEJUNUM; DX W/WO COLLECTION SPECIMN, BY BRUSH OR WASH;  Surgeon: Modena Nunnery, MD;  [facility];  Service: Gastroenterology       Family History   Problem Relation Age of Onset   ??? Diabetes Mother    ??? Hyperlipidemia Mother    ??? Heart disease Mother    ??? Basal cell carcinoma Mother    ??? Blindness Mother         diabeties   ??? Obesity Mother    ??? Hyperlipidemia Father    ??? Heart disease Father    ??? Lung disease Father    ??? Heart disease Half-Sister    ??? Kidney disease Half-Sister    ??? Gallbladder disease Half-Brother    ??? Obesity Half-Brother    ??? No Known Problems Daughter    ??? No Known Problems Son    ??? Obesity Daughter    ??? Melanoma Neg Hx    ??? Squamous cell carcinoma Neg Hx    ??? Macular degeneration Neg Hx    ??? Glaucoma Neg Hx        Social History     Socioeconomic History   ??? Marital status: Married     Spouse name: [name]   ??? Number of children: 3   ??? Years of education: 76   ??? Highest education level: Not on file   Occupational History   ??? Occupation: DISABLED     Comment: Former Surveyor, quantity Needs   ??? Financial resource strain: Very hard   ??? Food insecurity     Worry: Not on file     Inability: Not on file   ??? Transportation needs     Medical: Yes     Non-medical: Yes   Tobacco Use   ??? Smoking status: Never Smoker   ??? Smokeless tobacco: Current User     Types: Chew   ??? Tobacco comment: Pt dips 1 can a week, Pt not interested in quitting    Substance and Sexual Activity   ??? Alcohol use: Never     Alcohol/week: 0.0 standard drinks     Frequency: Never     Comment: rare use: couple times a year, small amount   ??? Drug use: No   ??? Sexual activity: Yes     Partners: Female   Lifestyle   ??? Physical activity     Days per week: Not on file     Minutes per session: Not on file   ??? Stress: Not on file   Relationships   ??? Social Wellsite geologist on phone: More than three times a week     Gets together: More than three times a week     Attends religious service: 1 to 4 times per year     Active member of club or organization: No     Attends meetings of clubs or organizations: Never     Relationship status: Married   Other Topics Concern   ??? Do you use sunscreen? No   ??? Tanning bed use? No   ???  Are you easily burned? No   ??? Excessive sun exposure? No   ??? Blistering sunburns? No   Social History Narrative    Information updated:  01/08/16. Reviewed 08/12/17:        Born in [place]    Raised with both parents    Parents died w/in 6 mos of each other. His parents knew they were going to pass and told him just before.    Half sister and 2 half brothers        Married 30 yrs. Wife [name]    2 other children son and daughter, adopted out    Daughter Bridgette, bf DJ        Education - completed through 11th. 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    Former Presenter, broadcasting since age 19    Lives with wife, difficulty paying bills 12/30/17     Lives next door to daughter, husband and their grandson        05/19/18: pt moving to new home in next 5 days- after an eviction. Has Medicaid assistance again. Will be w/ wife [name], they babysit their grandson often.    06/14/18: Pt and husband living with daughter while new location to live is determined.       Review of Systems   Constitutional: Negative for chills and fever.   Respiratory: Positive for shortness of breath. Negative for cough.    Cardiovascular: Positive for leg swelling. Negative for chest pain.   Gastrointestinal: Negative for abdominal distention.   Neurological: Positive for weakness (generalized).   All other systems reviewed and are negative.    Physical Exam     BP 131/67  - Pulse 85  - Temp 36.7 ??C (98.1 ??F) (Oral)  - Resp 14  - Wt (!) 154.5 kg (340 lb 11.2 oz)  - SpO2 99%  - BMI 43.74 kg/m??     Physical Exam  Vitals signs and nursing note reviewed.   Constitutional:       Appearance: He is well-developed.      Comments: Very obese male   HENT:      Head: Normocephalic and atraumatic.   Eyes:      Extraocular Movements: Extraocular movements intact.   Neck:      Musculoskeletal: Normal range of motion.   Cardiovascular:      Rate and Rhythm: Normal rate and regular rhythm.      Comments: Distant heart sounds  Pulmonary:      Effort: Pulmonary effort is normal. No respiratory distress.      Breath sounds: Normal breath sounds. No wheezing, rhonchi or rales.   Abdominal:      Tenderness: There is no abdominal tenderness. There is no guarding or rebound.   Musculoskeletal: Normal range of motion.         General: No deformity.      Right lower leg: No edema.      Left lower leg: No edema.      Comments: No edema noted bilaterally   Skin:     General: Skin is warm and dry.   Neurological:      Mental Status: He is alert and oriented to person, place, and time.      Motor: No abnormal muscle tone.  Coordination: Coordination normal.   Psychiatric:         Behavior: Behavior normal.       POC Ultrasound shows no pericardial effusion      Jeremy Route Rabinovich was evaluated in Emergency Department at the time of this visit for the symptoms described in the history of present illness. he was evaluated in the context of the global COVID-19 pandemic, which necessitated consideration that the patient might be at risk for infection with the SARS-CoV-2 virus that causes COVID-19. Institutional protocols and algorithms that pertain to the evaluation of patients at risk for COVID-19 were followed during the patient's care in the ED.    I did enter the patient room, and wore the following PPE: Surgical mask and gloves      Documentation assistance was provided by Geroge Baseman, Scribe, on July 22, 2018 at 10:26 AM for Herbie Drape, MD, MHS.    Documentation assistance was provided by the scribe in my presence.  The documentation recorded by the scribe has been reviewed by me and accurately reflects the services I personally performed.        Jorene Minors, MD  07/22/18 1340       Jorene Minors, MD  07/22/18 1343       Jorene Minors, MD  07/22/18 1345

## 2018-07-22 NOTE — Unmapped (Addendum)
The [facility] Personal Health Advocate Pool received a case management referral message for this Value Care Priority patient. Message forwarded via in-basket message to: [facility] Personal Health Advocate Case Manager Michaelene Song, RN for follow up with patient.        ----- Message from Shirlee Limerick, RN sent at 07/22/2018 12:29 PM EDT -----  Regarding: Value Care Patient Notification  (Directions: Please send to [facility])    I recently cared for one of the Value Care patients. Patient needs discharge follow-up.     If you have any questions please contact me at [phone], [facility] CM.      Deliah Goody - High Risk Care Coordinator   [facility]  [address], [address] [place], [place] [address]  P: [phone] F: [phone]  [email or web address]

## 2018-07-23 MED FILL — BUMETANIDE 2 MG TABLET: 23 days supply | Qty: 30 | Fill #0 | Status: AC

## 2018-07-23 NOTE — Unmapped (Signed)
Care Management  Initial Transition Planning Assessment    CM initial assessment completed telephonically as a precautionary measure in accordance with [facility] COVID-19 Pandemic emergency response plan. Patient representative wife [name] Crom [phone]) verbalized understanding and agreement with completing this assessment by telephone.     Patient has been identified as a Runner, broadcasting/film/video.??Longitudinal Plan of Care reviewed, the following information is noted for continuity of care:????After being evicted from his home on 05/25/2018, pt and his wife have been living with their daughter, daughter's fiance, and 55 yr old son. Pt is still open with [facility] for RN/PT/OT/SW. Pt has a RW, Wheelchair, Paediatric nurse, cane. CPAP has been left at the storage or evicted house. Pt and wife are looking at a place in [place] they can rent for 700$/month and their dog is allowed. Pt and wife reports that their daughter is also being evicted and has to leave end of the month. They already picked a place in [place] and are just waiting for the stimulus money or the tax refund to pay for the place. There was a problem with the stimulus money as [employer] messed up when they were supposed to receive the check as they filed their taxes as early as January. Their daughter is not allowing HH to come into the house so HH is placed on hold d/t the pandemic.  ??  Home Safety Screening  Patient Questions:????????????????????????????????????  1. ??Before the illness or injury that brought you to the ED, did you need someone to help you on a regular basis? Do you need people to help you with activities of daily living?  ? No  2. ??In the last 24 hours, have you needed more help than usual?  ? No  3. ??Have you been hospitalized for one or more nights during the past six months?   ? Yes -??per chart  4. ??Have you been to an emergency room one or more times in the past six months?  ? Yes -??per chart  5. ??In general, do you have problems with your vision that cannot be corrected with glasses/contacts?  ? No  6. ??In general, do you have problems with your memory?  ? No  7. ??Do you take six or more different medications every day?   ? Yes -??per chart  8. Do you have any trouble getting, taking, keeping up with or paying for your medications???  ? No  9 ????Have you had any falls over the past few weeks or are you concerned about falling?  ? No  10. ??Are you having trouble accessing and/or preparing the meals you need?  ? Yes - Pt has been getting fast food meals.  11. Do you have wounds or bed ulcers?  ? No  12 ??Do you have access to transportation?  ????????????????????????????????????????????????Yes -??wife can drive him  ??  Care Manager Questions:  Is the patient over the age of 4?  ? No  *Home Safety Evaluation endorsed????4??of the screening items and will update personal health advocate.??Personal Health Advocate Pool??contacted by In Basket message??regarding post-acute services and collaboration around plan of care.  ??  When patient/family asked, Were the services I provided/discussed with you today of value?, patient/family responded??Y/N???Yes - appreciates the call              General  Care Manager assessed the patient by : Medical record review, Telephone conversation with family  Orientation Level: Oriented X4  Who provides care at home?: N/A  Reason for referral: Discharge Planning, Other - specify in comments  Additional Information: ACO pt   Type of Residence: Mailing Address:  [address]  [place]  [place] [address]  Contacts: Extended Emergency Contact Information  Primary Emergency Contact: Mclaren,[name]  Address: [address] [place], [place] [address] [place]  Home Phone: [phone]  Mobile Phone: [phone]  Relation: Spouse  Secondary Emergency Contact: Thalmann,Bridgette   [place]  Home Phone: [phone]  Mobile Phone: [phone]  Relation: Daughter  Patient Phone Number: [phone] (home) Medical Provider(s): Kurtis Bushman, MD  Reason for Admission: Admitting Diagnosis:  No admission diagnoses are documented for this encounter.  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  Past Surgical History:   has a past surgical history that includes Knee surgery (Right, 09/23/2006); Cardiac catheterization (03/08/2007); Cystourethroscopy (12/31/2009 ); pr inser hart pacer xvenous atrial (N/A, 05/30/2015); pr ephys eval w/ ablation supravent arrhythmia (N/A, 07/19/2015); pr upper gi endoscopy,biopsy (N/A, 02/28/2016); pr upper gi endoscopy,diagnosis (N/A, 05/07/2016); pr cath place/coron angio, img super/interp,r&l hrt cath, l hrt ventric (N/A, 02/13/2017); pr cath place/coron angio, img super/interp,w left heart ventriculography (N/A, 03/17/2017); pr insert/place flow direct cath (N/A, 03/17/2017); pr ecmo/ecls initiation veno-arterial (N/A, 03/19/2017); pr rebl ves direct,low extrem (Left, 03/19/2017); pr ecmo/ecls rmvl prph cannula open 6 yrs & older (Left, 03/25/2017); pr remv art clot iliac-pop,leg incis (Left, 03/25/2017); pr negative pressure wound therapy dme >50 sq cm (Left, 03/25/2017); pr insj/rplcmt perm dfb w/trnsvns lds 1/dual chmbr (N/A, 05/04/2017); pr prq trluml coronary stent w/angio one art/brnch (N/A, 03/12/2018); and pr cath place/coron angio, img super/interp,w left heart ventriculography (N/A, 05/12/2018).   Previous admit date: 06/02/2018    Primary Insurance- Payor: MEDICARE / Plan: MEDICARE PART A AND PART B / Product Type: *No Product type* /   Secondary Insurance ??? Secondary Insurance  MEDICAID [place]  Prescription Coverage ??? same as above  Preferred Pharmacy - [facility] - [place], [place] - [address]  [facility]  [facility] - [place], [place] - [address]  [facility] - [place], [place] - [address]  [facility] [place] - [address]  [facility]  [facility]  [facility] - [place] ([place]), [place] - [address]    Transportation home: Private vehicle  Level of function prior to admission: Independent    Contact/Decision Programmer, applications Information  Primary Emergency Contact: Mira,[name]  Address: [address] [place], [place] [address] [place] of [place]  Home Phone: [phone]  Mobile Phone: [phone]  Relation: Spouse  Secondary Emergency Contact: Sneeringer,Bridgette  [place]  Home Phone: [phone]  Mobile Phone: [phone]  Relation: Daughter    Legal Next of Kin / Guardian / POA / Advance Directives     [name] [facility]): Dorer,[name] - [phone]    [name] [facility]): Belmar,Bridgette - Daughter - [phone]    Advance Directive (Medical Treatment)  Does patient have an advance directive covering medical treatment?: Patient has advance directive covering medical treatment, copy in chart.  Information provided on advance directive:: No  Patient requests assistance:: No Health Care Decision Maker [[name]] (Medical & Mental Health Treatment)  Information provided on advance directive:: No  Patient requests assistance:: No    Patient Information  Lives with: Spouse/significant other, Children, Family members(Pt and wife have been living with their daughter, daughter's fiance, daughter's toddler since they got evicted from their place in 05/25/2018. )    Type of Residence: Private residence     Location/Detail: [address] [place], [place] [address]; Phone: [phone]    Support Systems: Children, Family Members, Spouse    Responsibilities/Dependents at home?: No    Home Care services in place prior to admission?: Yes  Type of Home Care services in place prior to admission: Home nursing visits, Home PT, Home OT, Other (Comment)  Current Home Care provider (Name/Phone #): [facility] RN/PT/OT/SW. Currently, pt's daughter is not allowing any HH staff to enter her place d/t covid pandemic. HH on hold and [facility] will follow pt unitl June per PHA note.    Outpatient/Community Resources in place prior to admission: DSS, Clinic  Agency detail (Name/Phone #): Heart Track program at [facility]; DSS of [facility] for food & nutrition services. PCP: Wilhemena Durie [phone]    Equipment Currently Used at Home: cane, straight, wheelchair, manual, respiratory supplies  Current HME Agency (Name/Phone #): CPAP from [facility] left behind at their evicted apartment.    Currently receiving outpatient dialysis?: No    Financial Information    Need for financial assistance?: Yes  Type of financial assistance required: Community resources(Waiting on stimulus check that was supposed to be delivered but had a problem with [employer] that processed their tax return.)    Social Determinants of Health  Social Determinants of Health were addressed in provider documentation.  Please refer to patient history.    Discharge Needs Assessment  Concerns to be Addressed: no discharge needs identified    Clinical Risk Factors: Multiple Diagnoses (Chronic), Readmission < 30 Days    Barriers to taking medications: No    Prior overnight hospital stay or ED visit in last 90 days: Yes    Readmission Within the Last 30 Days: current reason for admission unrelated to previous admission    Anticipated Changes Related to Illness: none    Equipment Needed After Discharge: none    Discharge Facility/Level of Care Needs: other (see comments)(Home)    Readmission  Risk of Unplanned Readmission Score:  %  Predictive Model Details   No score data available for [facility] Risk of Unplanned Readmission     Readmitted Within the Last 30 Days? (No if blank)   Patient at risk for readmission?: Yes    Discharge Plan  Screen findings are: Discharge planning needs identified or anticipated (Comment).    Expected Discharge Date:      Expected Transfer from Critical Care:      Patient and/or family were provided with choice of facilities / services that are available and appropriate to meet post hospital care needs?: Yes  List choices in order highest to lowest preferred, if applicable. : ROC with [facility] HH once allowed     Initial Assessment complete?: Yes

## 2018-07-26 NOTE — Unmapped (Signed)
Reason for Disposition  ??? Vomiting    Answer Assessment - Initial Assessment Questions  1. DESCRIPTION: Describe your dizziness.      Lightheadedness  2. LIGHTHEADED: Do you feel lightheaded?     yes  3. VERTIGO: Do you feel like either you or the room is spinning or tilting? (i.e. vertigo) yes, weak upon standing.     4. SEVERITY: How bad is it?  Do you feel like you are going to faint? Can you stand and walk?    -   - MODERATE - interferes with normal activities 6/10per wife [name]  -    5. ONSET:  When did the dizziness begin? around 12pm today.       6. AGGRAVATING FACTORS: Does anything make it worse?   When he sits up  7. HEART RATE: 84  Threw up breakfast9. RECURRENT SYMPTOM: Have you had dizziness before? yes  If so, ask: When was the last time? What happened that time?- emesis has been going on since in the hospital in March 2020.        10. OTHER SYMPTOMS: Do you have any other symptoms? (e.g., fever, chest pain, vomiting, diarrhea, bleeding) keeps water down, food comes back up. No fever, no CP, no diarrhea, no bleeding.    Protocols used: VOMITING-ADULT-OH, DIZZINESS-ADULT-OH    [name] states pt will not come to [facility] today. They rec'd their governmental check and are buying a house today.  [name] states he is peeing too much.   Will monitor BP and call for any further concerns. Pt does have HHRN BP monitoring, [name] states they have not called them to address the BP standing 84/62, 84  States this morning sitting BP 116/84, 80s  FBG 125  [name] asks Misty Stanley to call her for them to schedule a virtual appt w/ Dr. Hulan Fess  Pt's wife asking f/u w/ Cardiology as well.     Call 20  Chart 10

## 2018-07-27 ENCOUNTER — Institutional Professional Consult (permissible substitution): Admit: 2018-07-27 | Discharge: 2018-07-27 | Disposition: A | Discharge status: Home / Self Care | Payer: MEDICARE

## 2018-07-27 ENCOUNTER — Ambulatory Visit: Admit: 2018-07-27 | Discharge: 2018-07-27 | Disposition: A | Discharge status: Home / Self Care | Payer: MEDICARE

## 2018-07-27 ENCOUNTER — Emergency Department: Admit: 2018-07-27 | Discharge: 2018-07-27 | Disposition: A | Discharge status: Home / Self Care | Payer: MEDICARE

## 2018-07-27 DIAGNOSIS — M25561 Pain in right knee: Principal | ICD-10-CM

## 2018-07-27 DIAGNOSIS — Z4502 Encounter for adjustment and management of automatic implantable cardiac defibrillator: Secondary | ICD-10-CM

## 2018-07-27 LAB — CBC W/ AUTO DIFF
BASOPHILS ABSOLUTE COUNT: 0.1 10*9/L (ref 0.0–0.1)
BASOPHILS RELATIVE PERCENT: 0.4 %
EOSINOPHILS ABSOLUTE COUNT: 0.1 10*9/L (ref 0.0–0.4)
HEMOGLOBIN: 10.2 g/dL — ABNORMAL LOW (ref 13.5–17.5)
LYMPHOCYTES ABSOLUTE COUNT: 1.8 10*9/L (ref 1.5–5.0)
LYMPHOCYTES RELATIVE PERCENT: 12 %
MEAN CORPUSCULAR HEMOGLOBIN CONC: 32 g/dL (ref 31.0–37.0)
MEAN CORPUSCULAR HEMOGLOBIN: 25.9 pg — ABNORMAL LOW (ref 26.0–34.0)
MEAN CORPUSCULAR VOLUME: 81.1 fL (ref 80.0–100.0)
MEAN PLATELET VOLUME: 7 fL (ref 7.0–10.0)
MONOCYTES ABSOLUTE COUNT: 0.7 10*9/L (ref 0.2–0.8)
MONOCYTES RELATIVE PERCENT: 4.6 %
NEUTROPHILS ABSOLUTE COUNT: 12 10*9/L — ABNORMAL HIGH (ref 2.0–7.5)
NEUTROPHILS RELATIVE PERCENT: 80.9 %
PLATELET COUNT: 444 10*9/L — ABNORMAL HIGH (ref 150–440)
PLATELET COUNT: 444 10*9/L — ABNORMAL HIGH (ref 150–440)
RED BLOOD CELL COUNT: 3.93 10*12/L — ABNORMAL LOW (ref 4.50–5.90)
RED CELL DISTRIBUTION WIDTH: 15.3 % — ABNORMAL HIGH (ref 12.0–15.0)
WBC ADJUSTED: 14.9 10*9/L — ABNORMAL HIGH (ref 4.5–11.0)

## 2018-07-27 LAB — COMPREHENSIVE METABOLIC PANEL
ALBUMIN: 3.8 g/dL (ref 3.5–5.0)
ALKALINE PHOSPHATASE: 89 U/L (ref 38–126)
ALT (SGPT): 9 U/L (ref <50)
ANION GAP: 14 mmol/L (ref 7–15)
AST (SGOT): 13 U/L — ABNORMAL LOW (ref 19–55)
BILIRUBIN TOTAL: 0.4 mg/dL (ref 0.0–1.2)
BLOOD UREA NITROGEN: 39 mg/dL — ABNORMAL HIGH (ref 7–21)
BUN / CREAT RATIO: 10
CHLORIDE: 105 mmol/L (ref 98–107)
CO2: 22 mmol/L (ref 22.0–30.0)
CREATININE: 4.05 mg/dL — ABNORMAL HIGH (ref 0.70–1.30)
EGFR CKD-EPI AA MALE: 19 mL/min/{1.73_m2} — ABNORMAL LOW (ref >=60)
EGFR CKD-EPI NON-AA MALE: 16 mL/min/{1.73_m2} — ABNORMAL LOW (ref >=60)
EGFR CKD-EPI NON-AA MALE: 16 mL/min/1.73m2 — ABNORMAL LOW (ref >=60–30.0)
GLUCOSE RANDOM: 143 mg/dL (ref 65–179)
PROTEIN TOTAL: 7.2 g/dL (ref 6.5–8.3)
SODIUM: 141 mmol/L (ref 135–145)

## 2018-07-27 MED ORDER — ONDANSETRON HCL 4 MG TABLET
ORAL_TABLET | Freq: Three times a day (TID) | ORAL | 0 refills | 0 days | Status: CP | PRN
Start: 2018-07-27 — End: 2018-08-25
  Filled 2018-07-29: qty 12, 4d supply, fill #0

## 2018-07-27 MED ORDER — SEMAGLUTIDE 0.25 MG OR 0.5 MG (2 MG/1.5 ML) SUBCUTANEOUS PEN INJECTOR
SUBCUTANEOUS | 3 refills | 0 days | Status: CP
Start: 2018-07-27 — End: 2019-07-27
  Filled 2018-07-29: qty 4.5, 84d supply, fill #0

## 2018-07-27 NOTE — Unmapped (Signed)
Patient reports right knee pain since yesterday.  Patient also reports vomiting multiple times today and feels he Howells be dehydrated.   States he has a hx of these symptoms x 4 months and is scheduled for GI testing.

## 2018-07-27 NOTE — Unmapped (Signed)
Pt requesting pain medication and nausea medication prior to telephone appt today. Pt currently in [place], heading to [place] (where pt gets meds) before going home to [place].    Pt states tylenol, naprosyn, Advil ineffective for >10/10 knee pain.    CCM asked pt to wait until phone appt @ 2:40pm  however pt states will not be able to go back out to pharmacy after phone appt.    Pt will answer phone anytime if visit scheduled earlier.    Will notify provider.

## 2018-07-27 NOTE — Unmapped (Signed)
Cardiac Implantable Electronic Device Remote Monitoring     Visit Date:  07/27/2018    Findings: Normal device function, Tested Lead measurements stable and within normal range, Adequate battery reserve- 13 years    Arrhythmias:     10 ATR, EGM appeared to show a fib  7 NS-VT, no EGM associated  4 VT-1, no therapy, available EGMs appeared to show a fib w/RVR     AT/AF burden<1%      Plan: Continue Routine Remote Monitoring   __________________________________________________________________    Manufacturer of Device: [employer]       Type of Device: Oceanographer  See scanned/downloaded PDF report for model numbers, serial numbers, and date(s) of implant.    Presenting Rhythm: AS/VS    ______________________________________________________________________    Heart Failure Monitoring: Not Applicable  ______________________________________________________________________      Please see downloaded PDF file of transmission under Media Tab  for full details of device interrogation to include, when applicable,  battery status/charge time, lead trend data, and programmed parameters.

## 2018-07-27 NOTE — Unmapped (Signed)
Incoming call from pharmacy with questions regarding sig:    Pharmacy verified. Please let me know if I can do anything else to help you. Thank you.

## 2018-07-27 NOTE — Unmapped (Signed)
[facility] Emergency Department Provider Note      ED Course, Assessment and Plan     Initial Clinical Impression:    July 27, 2018 2:09 PM   Jeremy Oliver is a 48 y.o. male with PMH of gout, asthma, atrial fibrillation and flutter, CHF, CAD w/ prior NSTEMI (2018) s/p PCI to mid LAD, ischemic cardiomyopathy with ICD, CKD stage III, DM, HLD, HTN, nonalcoholic fatty liver disease, seizure-like activity and migraines presenting with chronic right knee pain and vomiting, as described below. On exam, nontender abdomen. Normal work of breathing. tender to the medial aspect of right knee without skin changes, erythema, ecchymosis or signs of trauma. ROM limited by pain.     BP 134/64  - Pulse 94  - Temp 36.8 ??C (98.2 ??F) (Oral)  - Resp 20  - SpO2 94%     Differential diagnosis includes gout vs osteoarthritis with effusion.  Differential for vomiting includes acute gastritis versus continuation of chronic vomiting.  Very low suspicion for pancreatitis, Boerhaave's, or gallbladder pathology.  No concern for SBO.    Will obtain basic labs, XR knee and EKG. Will treat patient with Zofran.    ED Course:  ED Course as of Jul 27 1854   Tue Jul 27, 2018   1750 Work-up largely unremarkable.  Creatinine is elevated but at his baseline.  White count mildly elevated but suspect this to be a result of acute gastritis.  Given patient's normal work-up, will give p.o. challenge and anticipate discharge to home.      1800 Bedside ultrasound shows trace effusion to the right knee with no drainable fluid.  Knee x-ray shows osteoarthritis with mild effusion.      1610 Patient tolerating p.o. at this time and will be going home.  Instructed him to take his meds as soon as he got home as he was unable to take them this morning.  Patient is agreeable with this plan, and will follow-up with his PCP in 1-2 days.          _____________________________________________________________________    The case was discussed with attending physician who is in agreement with the above assessment and plan    Additional Medical Decision Making     I have reviewed the vital signs and the nursing notes. Labs and radiology results that were available during my care of the patient were independently reviewed by me and considered in my medical decision making.   I independently visualized the EKG tracing if performed  I independently visualized the radiology images if performed  I reviewed the patient's prior medical records if available.  Additional history obtained from family if available    History     CHIEF COMPLAINT:   Chief Complaint   Patient presents with   ??? Knee Pain Non-Traumatic       HPI: Jeremy Oliver is a 48 y.o. male with PMH of gout, asthma, atrial fibrillation and flutter, CHF, CAD w/ prior NSTEMI (2018) s/p PCI to mid LAD, ischemic cardiomyopathy with ICD, CKD stage III, DM, HLD, HTN, nonalcoholic fatty liver disease, seizure-like activity and migraines presenting with right knee pain over the last 3 months. His pain is worsened with weight bearing. His range of motion is limited by pain. He has only been able to ambulate with crutches since his pain began. He has taken Tylenol and his gout medication without relief. He has been seen for this previously and his imaging has not revealed a cause of  his pain. He endorses a hx of drop foot on the left.     He reports 3 months of vomiting, acutely worse today. He reports persistent nausea and vomiting today and has been unable to tolerate any PO intake. He does not take anything for nausea at home. He develops chest pain while vomiting, but he does not have any otherwise. He reports associated dizziness and vision changes. He describes his dizziness as the room spinning around him. His vision changes began while he was driving, and forced him to pull over. He reports that hs vision went black for a few minutes before resolving. He is scheduled for endoscopy on June 2nd. Denies hematemesis, fevers, chills, abdominal pain, LOC or right calf pain.     PAST MEDICAL HISTORY/PAST SURGICAL HISTORY:   Past Medical History:   Diagnosis Date   ??? Angina at rest (CMS-[name])    ??? Arthritis    ??? Asthma    ??? Atrial fibrillation and flutter (CMS-[name]) 06/08/14   ??? BMI 40.0-44.9, adult (CMS-[name]) 10/29/2015   ??? Chronic anticoagulation 02/17/2018   ??? Chronic kidney disease, stage 3 (CMS-[name])    ??? Chronic pain syndrome 04/11/2014    ~Use daily lyrica as recommended ~Tramadol as needed for pain ~Fluoxetine daily as recommended ~Daily exercise ~Eat a healthy diet ~Good control of your blood sugars can help    ??? Coronary artery disease    ??? Depression    ??? Diabetes mellitus (CMS-[name])    ??? Eye trauma 1998    glass in both eyes and removed   ??? GERD (gastroesophageal reflux disease)    ??? Gout    ??? Homeless    ??? Hyperlipidemia    ??? Hypertension    ??? Illiteracy and low-level literacy    ??? Microscopic hematuria    ??? Migraine    ??? Nephrolithiasis    ??? 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 Nonalcoholic fatty liver disease    ??? NSTEMI (non-ST elevated myocardial infarction) (CMS-[name]) 01/13/2017   ??? Obesity    ??? Obstructive sleep apnea 2009   ??? Panic attacks    ??? 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 Seizure-like activity (CMS-[name])     ~Monitor for symptoms and report them to your primary care provider if they occur    ??? Systolic CHF, chronic (CMS-[name])     EF 40-45% 2017   ??? TIA (transient ischemic attack)        Past Surgical History:   Procedure Laterality Date   ??? CARDIAC CATHETERIZATION  03/08/2007   ??? CYSTOURETHROSCOPY  12/31/2009     Microscopic hematuria   ??? KNEE SURGERY Right 09/23/2006    arthroscopic knee surgery medial and lateral meniscus tears as well as crystal disease   ??? PR CATH PLACE/CORON ANGIO, IMG SUPER/INTERP,R&L HRT CATH, L HRT VENTRIC N/A 02/13/2017    Procedure: Left/Right Heart Catheterization;  Surgeon: Marlaine Hind, MD;  Location: [facility] CATH;  Service: Cardiology   ??? PR CATH PLACE/CORON ANGIO, IMG SUPER/INTERP,W LEFT HEART VENTRICULOGRAPHY N/A 03/17/2017    Procedure: Left Heart  Catheterization W Intervention;  Surgeon: Marlaine Hind, MD;  Location: [facility] CATH;  Service: Cardiology   ??? PR CATH PLACE/CORON ANGIO, IMG SUPER/INTERP,W LEFT HEART VENTRICULOGRAPHY N/A 05/12/2018    Procedure: CATH LEFT HEART CATHETERIZATION W INTERVENTION;  Surgeon: Dorathy Kinsman, MD;  Location: [facility] CATH;  Service: Cardiology   ??? PR ECMO/ECLS INITIATION VENO-ARTERIAL N/A 03/19/2017    Procedure: ECMO/ECLS; INITIATION, VENO-ARTERIAL;  Surgeon: Lennie Odor, MD;  Location: MAIN OR [facility];  Service: Cardiac Surgery   ??? PR ECMO/ECLS RMVL PRPH CANNULA OPEN 6 YRS & OLDER Left 03/25/2017    Procedure: ECMO / ECLS PROVIDED BY PHYSICIAN; REMOVAL OF PERIPHERAL (ARTERIAL AND/OR VENOUS) CANNULA(E), OPEN, 6 YEARS AND OLDER;  Surgeon: Alonna Buckler Ikonomidis, MD;  Location: MAIN OR [facility];  Service: Cardiac Surgery   ??? PR EPHYS EVAL W/ ABLATION SUPRAVENT ARRHYTHMIA N/A 07/19/2015    Catheter ablation of cavotricuspid isthmus for atrial flutter [facility])   ??? PR INSER HART PACER XVENOUS ATRIAL N/A 05/30/2015    [employer] dual-chamber pacemaker implant (E.Chung)   ??? PR INSERT/PLACE FLOW DIRECT CATH N/A 03/17/2017    Procedure: Insert Leave In [place];  Surgeon: Marlaine Hind, MD;  Location: [facility] CATH;  Service: Cardiology   ??? PR INSJ/RPLCMT PERM DFB W/TRNSVNS LDS 1/DUAL CHMBR N/A 05/04/2017    Procedure: ICD Implant System (Single/Dual);  Surgeon: Eldred Manges, MD;  Location: [facility] CATH;  Service: Cardiology   ??? PR NEGATIVE PRESSURE WOUND THERAPY DME >50 SQ CM Left 03/25/2017    Procedure: Neg Press Wound Tx (Vac Assist) Incl Topicals, Per Session, Tsa Greater Than/= 50 Cm Squared;  Surgeon: Alonna Buckler Ikonomidis, MD;  Location: MAIN OR [facility];  Service: Cardiac Surgery   ??? PR PRQ TRLUML CORONARY STENT W/ANGIO ONE ART/BRNCH N/A 03/12/2018    Procedure: Percutaneous Coronary Intervention;  Surgeon: Marlaine Hind, MD;  Location: [facility] CATH;  Service: Cardiology   ??? PR REBL VES DIRECT,LOW EXTREM Left 03/19/2017    Procedure: Repr Bld Vessel Direct; Lower Extrem;  Surgeon: Boykin Reaper, MD;  Location: MAIN OR [facility];  Service: Vascular   ??? PR REMV ART CLOT ILIAC-POP,LEG INCIS Left 03/25/2017    Procedure: EMBOLECTOMY OR THROMBECTOMY, WITH OR WITHOUT CATHETER; FEMOROPOPLITEAL, AORTOILIAC ARTERY, BY LEG INCISION;  Surgeon: Alonna Buckler Ikonomidis, MD;  Location: MAIN OR [facility];  Service: Cardiac Surgery   ??? PR UPPER GI ENDOSCOPY,BIOPSY N/A 02/28/2016    Procedure: UGI ENDOSCOPY; WITH BIOPSY, SINGLE OR MULTIPLE;  Surgeon: Liane Comber, MD;  Location: [facility];  Service: Gastroenterology   ??? PR UPPER GI ENDOSCOPY,DIAGNOSIS N/A 05/07/2016    Procedure: UGI ENDO, INCLUDE ESOPHAGUS, STOMACH, & DUODENUM &/OR JEJUNUM; DX W/WO COLLECTION SPECIMN, BY BRUSH OR WASH;  Surgeon: Modena Nunnery, MD;  [facility];  Service: Gastroenterology       MEDICATIONS:   No current facility-administered medications for this encounter.     Current Outpatient Medications:   ???  acetaminophen (TYLENOL) 500 MG tablet, TAKE 2 TABLETS (1 000MG ) BY MOUTH EVERY 8 HOURS AS NEEDED FOR PAIN, Disp: 180 each, Rfl: 3  ???  albuterol HFA 90 mcg/actuation inhaler, Inhale 2 puffs into the lungs every 6 (six) hours as needed for wheezing or shortness of breath., Disp: 18 g, Rfl: 2  ???  alirocumab (PRALUENT) 150 mg/mL subcutaneous injection, Inject 1 mL (150 mg total) under the skin every fourteen (14) days., Disp: 4 Syringe, Rfl: 11  ???  allopurinoL (ZYLOPRIM) 100 MG tablet, Take  0.5 tablets (50 mg total) by mouth daily., Disp: 45 tablet, Rfl: 3  ???  amiodarone (PACERONE) 200 MG tablet, TAKE 1 TABLET BY MOUTH ONCE DAILY, Disp: 90 tablet, Rfl: 3  ???  arm brace Misc, 1 each by Miscellaneous route daily., Disp: 1 each, Rfl: 0  ???  atorvastatin (LIPITOR) 80 MG tablet, Take 1 tablet (80 mg total) by mouth daily., Disp: 90 tablet, Rfl: 3  ???  blood sugar diagnostic Strp, USE TO CHECK BLOOD SUGAR 4 TIMES DAILY (BEFORE MEALS AND NIGHTLY), Disp: 100 each, Rfl: 3  ???  blood-glucose meter kit, Use to check blood sugars, #1 meter, #100 strips. Freestyle glucose meter. ICD-9 250.00, Disp: 1 each, Rfl: 0  ???  blood-glucose sensor (FREESTYLE NAVIGATOR GLUC SENS) Devi, Frequency:BID   Dosage:0.0     Instructions:  Note:Dose: 1, Disp: , Rfl:   ???  bumetanide (BUMEX) 2 MG tablet, Take 1 tablet (2 mg total) by mouth two (2) times a day for 7 days. Afterwards, you can go back to your normal frequency of 1 tablet ONCE a day., Disp: 30 tablet, Rfl: 0  ???  carvediloL (COREG) 25 MG tablet, TAKE 1 TABLET BY MOUTH WITH THE MORNING MEAL AND TAKE 2 TABLETS WITH THE EVENING MEAL, Disp: 270 tablet, Rfl: 3  ???  cholecalciferol, vitamin D3, 5,000 unit capsule, Take 1 capsule (5,000 Units total) by mouth daily., Disp: 100 capsule, Rfl: 0  ???  diclofenac sodium (VOLTAREN) 1 % gel, Apply 2 grams topically Three (3) times a day as needed for pain., Disp: 100 g, Rfl: 0  ???  empty container Misc, Use as directed to dispose of injectable medications, Disp: 1 each, Rfl: 3  ???  ezetimibe (ZETIA) 10 mg tablet, TAKE 1 TABLET BY MOUTH ONCE DAILY, Disp: 90 each, Rfl: 3  ???  FLUoxetine (PROZAC) 20 MG capsule, Take 3 capsules (60 mg total) by mouth nightly., Disp: 270 capsule, Rfl: 3  ???  gabapentin (NEURONTIN) 300 MG capsule, Take 1 capsule (300 mg total) by mouth two (2) times a day., Disp: 60 capsule, Rfl: 0  ???  insulin ASPART (NOVOLOG FLEXPEN U-100 INSULIN) 100 unit/mL (3 mL) injection pen, Inject 0.15 mL (15 Units total) under the skin Three (3) times a day with a meal., Disp: 15 mL, Rfl: 11  ???  insulin detemir U-100 (LEVEMIR) 100 unit/mL (3 mL) injection pen, Inject 0.4 mL (40 Units total) under the skin nightly., Disp: 15 mL, Rfl: 3  ???  insulin syringe-needle U-100 0.3 mL 31 gauge x 5/16 Syrg, Use to inject insulin twice daily with meals, Disp: 60 each, Rfl: 0  ???  isosorbide mononitrate (IMDUR) 30 MG 24 hr tablet, Take 1 tablet (30 mg total) by mouth daily., Disp: 90 tablet, Rfl: 3  ???  lancets 30 gauge Misc, USE TO CHECK BLOOD SUGAR THREE TIMES DAILY WITH MEALS, Disp: 250 each, Rfl: 2  ???  melatonin 3 mg cap, Take 9 mg by mouth nightly., Disp: , Rfl:   ???  nitroglycerin (NITROSTAT) 0.4 MG SL tablet, Place 1 tablet (0.4 mg total) under the tongue every five (5) minutes as needed for chest pain., Disp: 100 each, Rfl: 3  ???  omeprazole (PRILOSEC) 20 MG capsule, Take 1 capsule (20 mg total) by mouth daily., Disp: 90 capsule, Rfl: 3  ???  ondansetron (ZOFRAN) 4 MG tablet, Take 1 tablet (4 mg total) by mouth every eight (8) hours as needed for nausea for up to 4 days., Disp: 12 tablet, Rfl:  0  ???  oxyCODONE-acetaminophen (PERCOCET) 5-325 mg per tablet, Take 1 tablet by mouth every eight (8) hours as needed., Disp: 12 tablet, Rfl: 0  ???  QUEtiapine (SEROQUEL) 50 MG tablet, Take 1 tablet (50 mg total) by mouth two (2) times a day as needed (headache)., Disp: 180 tablet, Rfl: 3  ???  rivaroxaban (XARELTO) 15 mg Tab, Take 1 tablet (15 mg total) by mouth daily with evening meal., Disp: 90 tablet, Rfl: 3  ???  semaglutide 0.25 mg or 0.5 mg(2 mg/1.5 mL) PnIj, Inject 0.5 mg under the skin every seven (7) days., Disp: 4.5 mL, Rfl: 3  ???  sevelamer (RENVELA) 800 mg tablet, TAKE 2 TABLETS (1600MG ) BY MOUTH THREE TIMES DAILY WITH MEALS, Disp: 540 tablet, Rfl: 3  ???  ticagrelor (BRILINTA) 90 mg Tab, TAKE 1 TABLET BY MOUTH TWICE DAILY, Disp: 180 tablet, Rfl: 3  ???  traMADol (ULTRAM) 50 mg tablet, Take 1 tablet (50 mg total) by mouth every six (6) hours as needed., Disp: 30 tablet, Rfl: 0  ???  UNIFINE PENTIPS 31 gauge x 5/16 Ndle, USE WITH INSULIN AND VICTOZA 5 TIMES DAILY, Disp: 400 each, Rfl: 3    ALLERGIES:   Losartan and Morphine    SOCIAL HISTORY:   Social History     Tobacco Use   ??? Smoking status: Never Smoker   ??? Smokeless tobacco: Current User     Types: Chew   ??? Tobacco comment: Pt dips 1 can a week, Pt not interested in quitting    Substance Use Topics   ??? Alcohol use: Never     Alcohol/week: 0.0 standard drinks     Frequency: Never     Comment: rare use: couple times a year, small amount       FAMILY HISTORY:  Family History   Problem Relation Age of Onset   ??? Diabetes Mother    ??? Hyperlipidemia Mother    ??? Heart disease Mother    ??? Basal cell carcinoma Mother    ??? Blindness Mother         diabeties   ??? Obesity Mother    ??? Hyperlipidemia Father    ??? Heart disease Father    ??? Lung disease Father    ??? Heart disease Half-Sister    ??? Kidney disease Half-Sister    ??? Gallbladder disease Half-Brother    ??? Obesity Half-Brother    ??? No Known Problems Daughter    ??? No Known Problems Son    ??? Obesity Daughter    ??? Melanoma Neg Hx    ??? Squamous cell carcinoma Neg Hx    ??? Macular degeneration Neg Hx    ??? Glaucoma Neg Hx           Review of Systems    A 10 point review of systems was performed and is negative other than positive elements noted in HPI   Constitutional: Negative for fever. Positive for dizziness  Eyes: Positive for visual changes.  ENT: Negative for sore throat.  Cardiovascular: No chest pain.  Respiratory: Negative for shortness of breath.  Gastrointestinal: Positive for nausea and vomiting  Genitourinary: Negative for dysuria.  Musculoskeletal: Positive for knee pain  Skin: Negative for rash.  Neurological: Negative for headaches, focal weakness or numbness.    Physical Exam     VITAL SIGNS:    BP 134/64  - Pulse 94  - Temp 36.8 ??C (98.2 ??F) (Oral)  - Resp 20  - SpO2 94% Constitutional:  Alert and oriented. Well appearing and in no distress.  Eyes: Conjunctivae are normal.  ENT       Head: Normocephalic and atraumatic.       Nose: No congestion.       Mouth/Throat: Mucous membranes are moist.       Neck: No stridor.  Hematological/Lymphatic/Immunilogical: No cervical lymphadenopathy.  Cardiovascular: S1, S2,  Normal and symmetric distal pulses are present in all extremities.Warm and well perfused.  Respiratory: Normal respiratory effort. Breath sounds are normal.  Gastrointestinal: Soft and nontender. There is no CVA tenderness.  Musculoskeletal: tender to the medial aspect of right knee without skin changes or signs of trauma. ROM limited by pain.   Neurologic: Normal speech and language. No gross focal neurologic deficits are appreciated.  Skin: Skin is warm, dry and intact. No rash noted.  Psychiatric: Mood and affect are normal. Speech and behavior are normal.      Radiology     XR Knee 1 or 2 Views Right   Final Result   No fracture. Normal alignment.       Moderate size knee effusion.      Mild tricompartmental osteoarthrosis.          Labs     Labs Reviewed   COMPREHENSIVE METABOLIC PANEL - Abnormal; Notable for the following components:       Result Value    Potassium 5.3 (*)     BUN 39 (*)     Creatinine 4.05 (*)     EGFR CKD-EPI Non-African American, Male 21 (*)     EGFR CKD-EPI African American, Male 59 (*)     AST 13 (*)     All other components within normal limits   CBC W/ AUTO DIFF - Abnormal; Notable for the following components:    WBC 14.9 (*)     RBC 3.93 (*)     HGB 10.2 (*)     HCT 31.9 (*)     MCH 25.9 (*)     RDW 15.3 (*)     Platelet 444 (*)     Absolute Neutrophils 12.0 (*)     Hypochromasia Slight (*)     All other components within normal limits    Narrative:     Please use the Absolute Differential for reference ranges.    LIPASE - Normal   CBC W/ DIFFERENTIAL    Narrative:     The following orders were created for panel order CBC w/ Differential. Procedure                               Abnormality         Status                     ---------                               -----------         ------                     CBC w/ Differential[[id number]]         Abnormal            Final result                 Please view results for these tests on the individual orders.  EKG: Normal sinus rhythm.  Normal rate. No ST or T wave changes to indicate ischemia.  No PR, QT, or QRS interval prolongation.    Pertinent labs & imaging results that were available during my care of the patient were reviewed by me and considered in my medical decision making (see chart for details).    Please note- This chart has been created using [employer]. Chart creation errors have been sought, but Baldi not always be located and such creation errors, especially pronoun confusion, do NOT reflect on the standard of medical care.    Documentation assistance was provided by Leighton Ruff, Scribe, on July 27, 2018 at 3:10 PM Earlie Raveling, MD.     July 27, 2018 6:57 PM. Documentation assistance provided by the above mentioned scribe. I was present during the time the encounter was recorded. The information recorded by the scribe was done at my direction and has been reviewed and validated by me.   Earlie Raveling, MD PGY-1        Ola Spurr, MD  Resident  07/27/18 [phone]

## 2018-07-27 NOTE — Unmapped (Signed)
Sent in new prescription with updated directions for Ozempic.

## 2018-07-27 NOTE — Unmapped (Signed)
Irving Burton from [facility] called to state pt.s wife called to advise that pts. BP on 07/26/18 was ranging from 84/62 to 124/75 with a HR of 85. Pt.s wife states pts. Weight has gone up to 338lbs. In calling Irving Burton was informed that [facility] is not allowed into the house pt. Is staying at. That all VS and information is relayed to [facility] via wife of pt. In checking pt.s chart pt. Is now at [facility] ER as of 07/27/2018.

## 2018-07-27 NOTE — Unmapped (Signed)
Patient going to [facility] ED for evaluation.    I was the supervising physician in the delivery of the service. Kurtis Bushman, MD

## 2018-07-28 NOTE — Unmapped (Signed)
CCM return call     PCP Action:advise re: recent abnormal labs        Pt's wife asking to discuss pt's recent abnormal lab results.[name] does verbalize understanding that labs are stable.    States pt is home and outside of knee pain doing well today. [name] will pick up Zofran rx tomorrow.       Will notify provider re: abnormal lab follow up

## 2018-07-28 NOTE — Unmapped (Signed)
Care Management  Initial Transition Planning Assessment    CM initial assessment completed telephonically as a precautionary measure in accordance with [facility] COVID-19 Pandemic emergency response plan. Patient representative wife [name] Gilpin [phone]) verbalized understanding and agreement with completing this assessment by telephone.   ??  Patient has been identified as a Runner, broadcasting/film/video.??Longitudinal Plan of Care reviewed, the following information is noted for continuity of care:????After being evicted from his home on 05/25/2018, pt and his wife have been living with their daughter, daughter's fiance, and 20 yr old son. Pt is still open with [facility] for RN/PT/OT/SW. Their daughter is not allowing HH to come into the house so HH is placed on hold d/t the pandemic. Pt has a RW, Wheelchair, Paediatric nurse, cane. CPAP has been left at the storage or evicted house. Pt and wife are looking at a place in [place] they can rent for 700$/month and their dog is allowed.??Pt's wife reports that their daughter is also being evicted and has to leave end of the month. They already picked a place in [place] but when the stimulus money or the tax refund came to pay for the place, their reservation got taken away. Now, people are calling them to pay for the money they owe. Pt's wife asked CM if she can help with calling the housing authority that would really help them. Being after hours at this time, CM unable to. Pt's wife hung up.  ??  Home Safety Screening  Patient Questions:????????????????????????????????????  1. ??Before the illness or injury that brought you to the ED, did you need someone to help you on a regular basis? Do you need people to help you with activities of daily living?  ? No  2. ??In the last 24 hours, have you needed more help than usual?  ? Yes knee has been bothering him  3. ??Have you been hospitalized for one or more nights during the past six months?   ? Yes -??per chart  4. ??Have you been to an emergency room one or more times in the past six months?  ? Yes -??per chart  5. ??In general, do you have problems with your vision that cannot be corrected with glasses/contacts?  ? No  6. ??In general, do you have problems with your memory?  ? No  7. ??Do you take six or more different medications every day?   ? Yes -??per chart  8. Do you have any trouble getting, taking, keeping up with or paying for your medications???  ? No  9 ????Have you had any falls over the past few weeks or are you concerned about falling?  ? No  10. ??Are you having trouble accessing and/or preparing the meals you need?  ? Yes - Pt has been getting fast food meals.  11. Do you have wounds or bed ulcers?  ? No  12 ??Do you have access to transportation?  ????????????????????????????????????????????????Yes -??wife can drive him  ??  Care Manager Questions:  Is the patient over the age of 29?  ? No  *Home Safety Evaluation endorsed??5??of the screening items and will update personal health advocate.??Personal Health Advocate Pool??contacted by In Basket message??regarding post-acute services and collaboration around plan of care.  ??  When patient/family asked, Were the services I provided/discussed with you today of value?, patient/family responded??Y/N???No unless CM can call Housing authority to assist with the housing placement.  General  Care Manager assessed the patient by : Medical record review, Telephone conversation with family  Orientation Level: Oriented X4  Who provides care at home?: N/A  Reason for referral: Discharge Planning, Other - specify in comments  Additional Information: ACO pt   Type of Residence: Mailing Address:  [address]  [place]  [place] [address]  Contacts: Accompanied by: Alone   Extended Emergency Contact Information  Primary Emergency Contact: Zalar,[name]  Address: [address] [place], [place] [address] [place]  Home Phone: [phone]  Mobile Phone: [phone]  Relation: Spouse  Secondary Emergency Contact: Oyola,Bridgette   [place]  Home Phone: [phone]  Mobile Phone: [phone]  Relation: Daughter  Patient Phone Number: [phone] (home)         Medical Provider(s): Kurtis Bushman, MD  Reason for Admission: Admitting Diagnosis:  No admission diagnoses are documented for this encounter.  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  Past Surgical History:   has a past surgical history that includes Knee surgery (Right, 09/23/2006); Cardiac catheterization (03/08/2007); Cystourethroscopy (12/31/2009 ); pr inser hart pacer xvenous atrial (N/A, 05/30/2015); pr ephys eval w/ ablation supravent arrhythmia (N/A, 07/19/2015); pr upper gi endoscopy,biopsy (N/A, 02/28/2016); pr upper gi endoscopy,diagnosis (N/A, 05/07/2016); pr cath place/coron angio, img super/interp,r&l hrt cath, l hrt ventric (N/A, 02/13/2017); pr cath place/coron angio, img super/interp,w left heart ventriculography (N/A, 03/17/2017); pr insert/place flow direct cath (N/A, 03/17/2017); pr ecmo/ecls initiation veno-arterial (N/A, 03/19/2017); pr rebl ves direct,low extrem (Left, 03/19/2017); pr ecmo/ecls rmvl prph cannula open 6 yrs & older (Left, 03/25/2017); pr remv art clot iliac-pop,leg incis (Left, 03/25/2017); pr negative pressure wound therapy dme >50 sq cm (Left, 03/25/2017); pr insj/rplcmt perm dfb w/trnsvns lds 1/dual chmbr (N/A, 05/04/2017); pr prq trluml coronary stent w/angio one art/brnch (N/A, 03/12/2018); and pr cath place/coron angio, img super/interp,w left heart ventriculography (N/A, 05/12/2018).   Previous admit date: 06/02/2018    Primary Insurance- Payor: MEDICARE / Plan: MEDICARE PART A AND PART B / Product Type: *No Product type* /   Secondary Insurance ??? Secondary Insurance  MEDICAID [place]  Prescription Coverage ??? same as above  Preferred Pharmacy - [facility] - [place], [place] - [address]  [facility]  [facility] - [place], [place] - [address]  [facility] [place] - [address]  [facility] [place] - [address]  [facility]  [facility]  [facility] - [place] ([place]), [place] - [address]    Transportation home: Private vehicle  Level of function prior to admission: Independent    Contact/Decision Maker  Extended Emergency Contact Information  Primary Emergency Contact: Sherburne,[name]  Address: [address] [place], [place] [address] [place]  Home Phone: [phone]  Mobile Phone: [phone]  Relation: Spouse  Secondary Emergency Contact: Plucinski,Bridgette   [place]  Home Phone: [phone]  Mobile Phone: [phone]  Relation: Daughter    Legal Next of Kin / Guardian / POA / Advance Directives     [name] [facility]): Genna,[name] - [phone]    [name] [facility]): Shadrick,Bridgette - Daughter - [phone]    Advance Directive (Medical Treatment)  Does patient have an advance directive covering medical treatment?: Patient has advance directive covering medical treatment, copy in chart.  Information provided on advance directive:: No  Patient requests assistance:: No    Health Care Decision Maker [[name]] (Medical & Mental Health Treatment)  Information provided on advance directive:: No  Patient requests assistance:: No    Patient Information  Lives with: Spouse/significant other, Children, Family members(Pt and wife living with their daughter, daughter's fiance, and daughter's baby.)    Type of Residence: Private residence     Location/Detail: [address] [place], [place] [address]; Phone: [phone]    Support Systems: Children, Family Members, Spouse    Responsibilities/Dependents at home?: No    Home Care services in place prior to admission?: Yes  Type of Home Care services in place prior to admission: Home nursing visits, Home PT, Home OT, Other (Comment)  Current Home Care provider (Name/Phone #): [facility] RN/PT/OT/SW. Currently, pt's daughter is not allowing any HH staff to enter her place d/t covid pandemic. HH on hold and [facility] will follow pt unitl June per PHA note.    Outpatient/Community Resources in place prior to admission: DSS, Clinic  Agency detail (Name/Phone #): Heart Track program at [facility]; DSS of [facility] for food & nutrition services. PCP: Wilhemena Durie [phone]    Equipment Currently Used at Home: cane, straight, wheelchair, manual, respiratory supplies  Current HME Agency (Name/Phone #): CPAP from [facility] left behind at their evicted apartment.    Currently receiving outpatient dialysis?: No    Financial Information    Need for financial assistance?: Yes  Type of financial assistance required: Community resources(Stimulus check already received but pt lost the reservation to the trailer in [place]. Pt's wife asked CM if able to call [employer] to help with the process. CM unable to commit unless pt gets admitted as the is closed this time.)    Social Determinants of Health  Social Determinants of Health were addressed in provider documentation.  Please refer to patient history.    Discharge Needs Assessment  Concerns to be Addressed: financial/insurance    Clinical Risk Factors: Multiple Diagnoses (Chronic), Readmission < 30 Days    Barriers to taking medications: No    Prior overnight hospital stay or ED visit in last 90 days: Yes    Readmission Within the Last 30 Days: current reason for admission unrelated to previous admission    Anticipated Changes Related to Illness: none    Equipment Needed After Discharge: none    Discharge Facility/Level of Care Needs: other (see comments)(Home with self-care)    Readmission  Risk of Unplanned Readmission Score:  %  Predictive Model Details   No score data available for [facility] Risk of Unplanned Readmission     Readmitted Within the Last 30 Days? (No if blank)   Patient at risk for readmission?: Yes    Discharge Plan  Screen findings are: Discharge planning needs identified or anticipated (Comment).    Expected Discharge Date:      Expected Transfer from Critical Care:      Patient and/or family were provided with choice of facilities / services that are available and appropriate to meet post hospital care needs?: Yes   List choices in order highest to lowest preferred, if applicable. : ROC with [facility] once allowed     Initial Assessment complete?: Yes

## 2018-07-28 NOTE — Unmapped (Signed)
Internal Medicine Phone Visit    This visit is conducted via telephone.    Contact Information  Person Contacted: Patient  Contact Phone number: [phone] (home)   Is there someone else in the room? Yes. What is your relationship? yes. Do you want this person here for the visit? wife.  Patient agreed to a phone visit    [name] is a 48 y.o. male  participating in a telephone encounter.    Reason for Call  Abnormal lab results    Subjective:  Recent ED visit for knee pain and vomiting he reported.    Reports he has lost 20-something lbs in the past 2 days.   Still having indigested food that is coming. Is not scheduled for gastric emptying studying until June.     States knee hurts more, no redness.  Reports 2 hard bumps have appeared.  At ED, POC [place] without any fluid for aspiration.  XR knee w/ evidence of osteoarthritis.  Was advised to f/u with PCP.  Feels that he can't walk    Wife has questions about his labs    I have reviewed the problem list, medications, and allergies and have updated/reconciled them if needed.    Objective:  GENERAL:  Well sounding, NAD    Assessment & Plan:  1. 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 CKD (chronic kidney disease) stage 4, GFR 15-29 ml/min (CMS-[name])    3. Hyperkalemia    4. Diabetes mellitus with gastroparesis (CMS-[name])    5. Type 2 diabetes mellitus with stage 4 chronic kidney disease, with long-term current use of insulin (CMS-[name])        Chronic gout of left knee:  Left knee pain could be related to chronic gout, osteoarthritis.  Given recent evaluation at ED less suspicious of septic joint. Of note has leukocytosis with left shift, which has persisted overtime.   -- continue allopurinol as prescribed.   -- refer to rheum to assist with management given CKD, possible initiation of febuxostat    CKD, stage 4, hyperkalemia  Recent labs with Cr up to 4.05 (3-3.5 most recently) with mild hyperkalemia,  -- instructed to f/u with [facility] [place] to continue discussions regarding initiation of HD in the future    DM with gastroparesis  Continues to have emesis of undigested food. NM gastric emptying studying pending. Wife will call to see if can get sooner appt.   -- symptomatic treatment with Zofran   -- avoid Reglan at this time given his cardiac history  -- advised multiple small meals throughout the day    T2DM on insulin with CKD  BGs on recent labs at ED visits with improved control, most recently 143.  -- continue levemir 40mg  daily  -- Novolog 15 units TID  -- Ozempic 0.5mg  once a week  Lab Results   Component Value Date    A1C 9.5 (H) 07/02/2018   Repeat A1c in July        Return in about 2 weeks (around 08/11/2018).         I spent 20 minutes on the phone with the patient. I spent an additional 10 minutes on pre- and post-visit activities.     The patient was physically located in [place] or a state in which I am permitted to provide care. The patient understood that s/he Devargas incur co-pays and cost sharing, and agreed to the telemedicine visit. The visit was completed via phone and/or video,  which was appropriate and reasonable under the circumstances given the patient's presentation at the time.    The patient has been advised of the potential risks and limitations of this mode of treatment (including, but not limited to, the absence of in-person examination) and has agreed to be treated using telemedicine. The patient's/patient's family's questions regarding telemedicine have been answered.     If the phone/video visit was completed in an ambulatory setting, the patient has also been advised to contact their provider???s office for worsening conditions, and seek emergency medical treatment and/or call 911 if the patient deems either necessary.

## 2018-07-29 MED FILL — ONDANSETRON HCL 4 MG TABLET: 4 days supply | Qty: 12 | Fill #0 | Status: AC

## 2018-07-29 MED FILL — OZEMPIC 0.25 MG OR 0.5 MG (2 MG/1.5 ML) SUBCUTANEOUS PEN INJECTOR: 84 days supply | Qty: 4 | Fill #0 | Status: AC

## 2018-07-29 NOTE — Unmapped (Signed)
Complete phone visit with him and wife on 07/28/18

## 2018-08-04 NOTE — Unmapped (Signed)
I was the supervising physician in the delivery of the service. Airon Sahni D Vannary Greening, MD

## 2018-08-04 NOTE — Unmapped (Signed)
CCM Intake Call from pt    Pt calling in re:  Emesis today and living situation      PCP Action:None       Irving Burton RN [facility] [phone]  Also Calling triage re: pt w/ emesis, states zofran ineffective   Irving Burton and pt confirm pt  Is keeping water and jello down today.  BG running 150s    Pt states  threw up stomach acid 2 hours ago.      GI scope scheduled in June, pt and wife state trying to schedule it sooner.    Pt having difficulty getting Trailer rented to live in, will follow up- denies that SW can help, states, I've tried everything. Pt claims someone has taken his $100 and no one has gotten back to him on being able to rent.    CCM asked pt what v/s have been. Pt unsure and states, don't worry they (HH) got me monitored.    Pt to call CCM back re:  Living situation  Emesis  And any other concerns.  Knee pain continues however pt states is able to do many of the PT exercises taught to him, just not walking well and still weak.      Pt states afebrile, no new pain, no c/o CP, chronic SOB w/ exertion up stairs.    Denies need to be seen in [facility].

## 2018-08-09 ENCOUNTER — Institutional Professional Consult (permissible substitution): Admit: 2018-08-09 | Discharge: 2018-08-10 | Payer: MEDICARE

## 2018-08-09 DIAGNOSIS — E7800 Pure hypercholesterolemia, unspecified: Principal | ICD-10-CM

## 2018-08-09 DIAGNOSIS — I2510 Atherosclerotic heart disease of native coronary artery without angina pectoris: Secondary | ICD-10-CM

## 2018-08-09 NOTE — Unmapped (Signed)
Attempted to reach patient to follow up regarding increase in Praluent dose in March.   Patient need Lipids checked to assess Praluent dose.  He has follow up appointment with Dr. Andrey Farmer on 5/14, lab order placed.      Armando Gang, PharmD, BCACP, CPP  Heart and Vascular Clinical Pharmacist Practitioner  [address]  [place], [place] [address]  Scheduling Phone #: [phone]   Clinic Phone #: [phone]

## 2018-08-11 MED ORDER — BUMETANIDE 2 MG TABLET
ORAL_TABLET | Freq: Every day | ORAL | 3 refills | 90 days | Status: CP
Start: 2018-08-11 — End: 2019-08-11
  Filled 2018-08-13: qty 90, 90d supply, fill #0

## 2018-08-11 NOTE — Unmapped (Signed)
This pt's wife called today regarding the rheumatology referral.  They were told it wouldn't 10/2018 before he could get a in person clinic appt.  Pt doesn't want to wait that long and can't do the video visit.  What should he do?

## 2018-08-11 NOTE — Unmapped (Signed)
Would encourage he do a video visits.  Would reiterate this is done through his smartphone, which I believe he has.  Just need the camera to work.  They can also do new patient visits via phone as well, however would advise the camera aspect so that they can look at his joints.

## 2018-08-12 ENCOUNTER — Institutional Professional Consult (permissible substitution)
Admit: 2018-08-12 | Discharge: 2018-08-12 | Payer: MEDICARE | Attending: Student in an Organized Health Care Education/Training Program | Primary: Student in an Organized Health Care Education/Training Program

## 2018-08-12 ENCOUNTER — Ambulatory Visit
Admit: 2018-08-12 | Discharge: 2018-08-12 | Payer: MEDICARE | Attending: Cardiovascular Disease | Primary: Cardiovascular Disease

## 2018-08-12 DIAGNOSIS — I10 Essential (primary) hypertension: Principal | ICD-10-CM

## 2018-08-12 DIAGNOSIS — M25561 Pain in right knee: Secondary | ICD-10-CM

## 2018-08-12 DIAGNOSIS — I2510 Atherosclerotic heart disease of native coronary artery without angina pectoris: Secondary | ICD-10-CM

## 2018-08-12 DIAGNOSIS — I480 Paroxysmal atrial fibrillation: Secondary | ICD-10-CM

## 2018-08-12 DIAGNOSIS — N184 Chronic kidney disease, stage 4 (severe): Secondary | ICD-10-CM

## 2018-08-12 DIAGNOSIS — E875 Hyperkalemia: Secondary | ICD-10-CM

## 2018-08-12 DIAGNOSIS — I5042 Chronic combined systolic (congestive) and diastolic (congestive) heart failure: Secondary | ICD-10-CM

## 2018-08-12 DIAGNOSIS — Z794 Long term (current) use of insulin: Secondary | ICD-10-CM

## 2018-08-12 DIAGNOSIS — E7800 Pure hypercholesterolemia, unspecified: Principal | ICD-10-CM

## 2018-08-12 DIAGNOSIS — E1143 Type 2 diabetes mellitus with diabetic autonomic (poly)neuropathy: Secondary | ICD-10-CM

## 2018-08-12 DIAGNOSIS — E1122 Type 2 diabetes mellitus with diabetic chronic kidney disease: Secondary | ICD-10-CM

## 2018-08-12 LAB — BASIC METABOLIC PANEL
ANION GAP: 15 mmol/L (ref 7–15)
BUN / CREAT RATIO: 11
CALCIUM: 9.2 mg/dL (ref 8.5–10.2)
CO2: 20 mmol/L — ABNORMAL LOW (ref 22.0–30.0)
CREATININE: 3.52 mg/dL — ABNORMAL HIGH (ref 0.70–1.30)
EGFR CKD-EPI AA MALE: 22 mL/min/{1.73_m2} — ABNORMAL LOW (ref >=60)
EGFR CKD-EPI NON-AA MALE: 19 mL/min/{1.73_m2} — ABNORMAL LOW (ref >=60)
GLUCOSE RANDOM: 253 mg/dL — ABNORMAL HIGH (ref 70–179)
POTASSIUM: 5.7 mmol/L — ABNORMAL HIGH (ref 3.5–5.0)
SODIUM: 138 mmol/L (ref 135–145)

## 2018-08-12 LAB — TRANSFERRIN: Transferrin:MCnc:Pt:Ser/Plas:Qn:: 208.6

## 2018-08-12 LAB — WBC ADJUSTED: Lab: 9.4

## 2018-08-12 LAB — URIC ACID: Urate:MCnc:Pt:Ser/Plas:Qn:: 9

## 2018-08-12 LAB — LIPID PANEL
CHOLESTEROL/HDL RATIO SCREEN: 6.5 — ABNORMAL HIGH (ref <5.0)
CHOLESTEROL: 183 mg/dL (ref 100–199)
HDL CHOLESTEROL: 28 mg/dL — ABNORMAL LOW (ref 40–59)
LDL CHOLESTEROL CALCULATED: 105 mg/dL — ABNORMAL HIGH (ref 60–99)

## 2018-08-12 LAB — CBC
HEMATOCRIT: 32.4 % — ABNORMAL LOW (ref 41.0–53.0)
HEMOGLOBIN: 10.2 g/dL — ABNORMAL LOW (ref 13.5–17.5)
MEAN CORPUSCULAR HEMOGLOBIN CONC: 31.4 g/dL (ref 31.0–37.0)
MEAN CORPUSCULAR VOLUME: 83.1 fL (ref 80.0–100.0)
MEAN PLATELET VOLUME: 9.1 fL (ref 7.0–10.0)
PLATELET COUNT: 491 10*9/L — ABNORMAL HIGH (ref 150–440)
RED BLOOD CELL COUNT: 3.9 10*12/L — ABNORMAL LOW (ref 4.50–5.90)
RED CELL DISTRIBUTION WIDTH: 15.4 % — ABNORMAL HIGH (ref 12.0–15.0)

## 2018-08-12 LAB — IRON PANEL
IRON SATURATION (CALC): 20 % (ref 20–50)
IRON: 52 ug/dL (ref 35–165)

## 2018-08-12 LAB — CHOLESTEROL/HDL RATIO SCREEN: Lab: 6.5 — ABNORMAL HIGH

## 2018-08-12 LAB — CALCIUM: Calcium:MCnc:Pt:Ser/Plas:Qn:: 9.2

## 2018-08-12 LAB — PHOSPHORUS
PHOSPHORUS: 5.1 mg/dL — ABNORMAL HIGH (ref 2.9–4.7)
Phosphate:MCnc:Pt:Ser/Plas:Qn:: 5.1 — ABNORMAL HIGH

## 2018-08-12 LAB — EGFR CKD-EPI AA MALE: Lab: 22 — ABNORMAL LOW

## 2018-08-12 LAB — PARATHYROID HOMONE (PTH): PARATHYROID HORMONE INTACT: 114 pg/mL — ABNORMAL HIGH (ref 12.0–72.0)

## 2018-08-12 LAB — FERRITIN: Ferritin:MCnc:Pt:Ser/Plas:Qn:: 149

## 2018-08-12 MED ORDER — DICLOFENAC 1 % TOPICAL GEL
Freq: Three times a day (TID) | TOPICAL | 0 refills | 0.00000 days | Status: CP | PRN
Start: 2018-08-12 — End: 2018-08-12
  Filled 2018-08-13: qty 100, 17d supply, fill #0

## 2018-08-12 MED ORDER — DICLOFENAC 1 % TOPICAL GEL: 2 g | g | Freq: Three times a day (TID) | 11 refills | 17 days | Status: AC

## 2018-08-12 NOTE — Unmapped (Addendum)
Try Voltaren gel to be applied to the right knee 3 x daily    If no relief by Monday (5/18), call Same Day Clinic and request appointment to have knee evaluated for fluid build up and possible drainage.

## 2018-08-12 NOTE — Unmapped (Addendum)
Continue your current medications  Return to see me in 3 months    Elpidio Anis MD [facility]  [facility]   Division of Cardiology  [phone] pgr    Dictated using Animal nutritionist, please excuse typos

## 2018-08-12 NOTE — Unmapped (Signed)
PCP:  Kurtis Bushman, Oliver    Patient ID: Jeremy Oliver is a 48 y.o. male.    Assessment and Plan:   Jeremy Oliver is doing better today, and there is no evidence of decompensated heart failure on examination.  His weight has been stable but he is having a lot of difficulty walking upstairs, partially due to orthopedic issues in particular severe pain in his right knee.  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  He would be at high risk for decompensation during general anesthesia and extensive surgery, but hopefully there will be minimally invasive treatments for his knee arthritis and can provide him some relief.  He will continue see Dr. Hulan Fess regularly and return to clinic on a 53-month basis for life evaluation of his ischemic cardiomyopathy.  He appears to be well compensated and we made no changes to his regimen today.  I encouraged him to continue daily weight monitoring and regular blood pressure checks as well.  If he remains in normal sinus rhythm we [place] consider discontinuation of his amiodarone in the long-term.    1. Coronary artery disease involving native coronary artery of native heart without angina pectoris  2. Chronic heart failure with reduced ejection fraction and diastolic dysfunction (CMS-[name])  3.  Paroxysmal atrial fibrillation (CMS-[name])  4. Essential hypertension (RAF-[name])    Return in about 3 months (around 11/12/2018).    [facility]  [facility]   Division of Cardiology  [phone] pgr    Dictated using Dragon software, please excuse typos    Subjective:      Jeremy Oliver return to clinic today.  After recent phone visit we sent him to the emergency department due to concern for decompensated congestive heart failure.  He was found to have an elevated proBNP and some evidence of volume overload and his Bumex was increased.  He has done well since that time and his weight has been stable.  His biggest complaint is difficulty climbing the stairs at his daughter's house.  He has severe pain in his right knee and by the time he is able to get up the stairs he feels extremely weak and sick to his stomach.  He denies any chest pain, palpitations, syncope, falls or injuries.  He does have chronic dyspnea and currently has NYHA functional class II-III symptoms.    Patient Active Problem List   Diagnosis   ??? CAD (coronary artery disease) (RAF-[name])   ??? Idiopathic chronic gout of left knee   ??? Hypercholesteremia   ??? Essential hypertension (RAF-[name])   ??? 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 Osteoarthritis   ??? Chronic nonalcoholic liver disease   ??? GERD (gastroesophageal reflux disease)   ??? Mild intermittent asthma without complication   ??? Chronic pain syndrome   ??? Paroxysmal atrial fibrillation (CMS-[name])   ??? OSA (obstructive sleep apnea)   ??? Tobacco use disorder   ??? Status post cardiac pacemaker procedure   ??? Diabetes mellitus with gastroparesis (CMS-[name])   ??? Cardiac pacemaker in situ   ??? Recurrent major depressive disorder, in full remission (CMS-[name])   ??? Adjustment disorder, unspecified   ??? Chronic heart failure with reduced ejection fraction and diastolic dysfunction (CMS-[name])   ??? Hyperkalemia   ??? CKD (  chronic kidney disease) stage 4, GFR 15-29 ml/min (CMS-[name])   ??? Acquired left foot drop   ??? Decreased sensation of lower extremity, left   ??? Chronic anticoagulation   ??? Type 2 diabetes mellitus with stage 4 chronic kidney disease, with long-term current use of insulin (CMS-[name])       Current Outpatient Medications   Medication Sig Dispense Refill   ??? acetaminophen (TYLENOL) 500 MG tablet TAKE 2 TABLETS (1 000MG ) BY MOUTH EVERY 8 HOURS AS NEEDED FOR PAIN 180 each 3   ??? albuterol HFA 90 mcg/actuation inhaler Inhale 2 puffs into the lungs every 6 (six) hours as needed for wheezing or shortness of breath. 18 g 2   ??? alirocumab (PRALUENT) 150 mg/mL subcutaneous injection Inject 1 mL (150 mg total) under the skin every fourteen (14) days. 4 Syringe 11   ??? allopurinoL (ZYLOPRIM) 100 MG tablet Take 0.5 tablets (50 mg total) by mouth daily. 45 tablet 3   ??? amiodarone (PACERONE) 200 MG tablet TAKE 1 TABLET BY MOUTH ONCE DAILY 90 tablet 3   ??? arm brace Misc 1 each by Miscellaneous route daily. 1 each 0   ??? atorvastatin (LIPITOR) 80 MG tablet Take 1 tablet (80 mg total) by mouth daily. 90 tablet 3   ??? blood sugar diagnostic Strp USE TO CHECK BLOOD SUGAR 4 TIMES DAILY (BEFORE MEALS AND NIGHTLY) 100 each 3   ??? blood-glucose meter kit Use to check blood sugars, #1 meter, #100 strips. Freestyle glucose meter. ICD-9 250.00 1 each 0   ??? blood-glucose sensor (FREESTYLE NAVIGATOR GLUC SENS) Devi Frequency:BID   Dosage:0.0     Instructions:  Note:Dose: 1     ??? bumetanide (BUMEX) 2 MG tablet Take 1 tablet (2 mg total) by mouth daily. 90 tablet 3   ??? carvediloL (COREG) 25 MG tablet TAKE 1 TABLET BY MOUTH WITH THE MORNING MEAL AND TAKE 2 TABLETS WITH THE EVENING MEAL 270 tablet 3   ??? cholecalciferol, vitamin D3, 5,000 unit capsule Take 1 capsule (5,000 Units total) by mouth daily. 100 capsule 0   ??? diclofenac sodium (VOLTAREN) 1 % gel Apply 2 grams topically Three (3) times a day. 100 g 11   ??? empty container Misc Use as directed to dispose of injectable medications 1 each 3   ??? ezetimibe (ZETIA) 10 mg tablet TAKE 1 TABLET BY MOUTH ONCE DAILY 90 each 3   ??? FLUoxetine (PROZAC) 20 MG capsule Take 3 capsules (60 mg total) by mouth nightly. 270 capsule 3   ??? gabapentin (NEURONTIN) 300 MG capsule Take 1 capsule (300 mg total) by mouth two (2) times a day. 60 capsule 0   ??? insulin ASPART (NOVOLOG FLEXPEN U-100 INSULIN) 100 unit/mL (3 mL) injection pen Inject 0.15 mL (15 Units total) under the skin Three (3) times a day with a meal. 15 mL 11   ??? insulin detemir U-100 (LEVEMIR) 100 unit/mL (3 mL) injection pen Inject 0.4 mL (40 Units total) under the skin nightly. 15 mL 3   ??? insulin syringe-needle U-100 0.3 mL 31 gauge x 5/16 Syrg Use to inject insulin twice daily with meals 60 each 0   ??? isosorbide mononitrate (IMDUR) 30 MG 24 hr tablet Take 1 tablet (30 mg total) by mouth daily. 90 tablet 3   ??? melatonin 3 mg cap Take 9 mg by mouth nightly.     ??? nitroglycerin (NITROSTAT) 0.4 MG SL tablet Place 1 tablet (0.4 mg total) under the tongue every five (5) minutes  as needed for chest pain. 100 each 3   ??? omeprazole (PRILOSEC) 20 MG capsule Take 1 capsule (20 mg total) by mouth daily. 90 capsule 3   ??? QUEtiapine (SEROQUEL) 50 MG tablet Take 1 tablet (50 mg total) by mouth two (2) times a day as needed (headache). 180 tablet 3   ??? rivaroxaban (XARELTO) 15 mg Tab Take 1 tablet (15 mg total) by mouth daily with evening meal. 90 tablet 3   ??? semaglutide 0.25 mg or 0.5 mg(2 mg/1.5 mL) PnIj Inject 0.5 mg under the skin every seven (7) days. 4.5 mL 3   ??? sevelamer (RENVELA) 800 mg tablet TAKE 2 TABLETS (1600MG ) BY MOUTH THREE TIMES DAILY WITH MEALS 540 tablet 3   ??? ticagrelor (BRILINTA) 90 mg Tab TAKE 1 TABLET BY MOUTH TWICE DAILY 180 tablet 3   ??? traMADol (ULTRAM) 50 mg tablet Take 1 tablet (50 mg total) by mouth every six (6) hours as needed. 30 tablet 0   ??? UNIFINE PENTIPS 31 gauge x 5/16 Ndle USE WITH INSULIN AND VICTOZA 5 TIMES DAILY 400 each 3   ??? oxyCODONE-acetaminophen (PERCOCET) 5-325 mg per tablet Take 1 tablet by mouth every eight (8) hours as needed. (Patient not taking: Reported on 08/12/2018) 12 tablet 0   ??? sodium polystyrene, SPS, with sorbitol (SPS, WITH SORBITOL,) 15-20 gram/60 mL Susp Take 60 mL (15 g) by mouth once a week. 473 mL 0     No current facility-administered medications for this visit.        Allergies   Allergen Reactions   ??? Losartan      Hyperkalemia (K>6)   ??? Morphine Palpitations     Medication error: was administered 20mL instead of 2mL       Review of Systems  Pertinent items are noted in HPI.      Objective: Vitals:    08/12/18 1503   BP: 138/85   Pulse: 89   Temp: 36.6 ??C (97.8 ??F)   SpO2: 98%   Body mass index is 43.69 kg/m??.    Wt Readings from Last 6 Encounters:   08/12/18 (!) 154.4 kg (340 lb 4.8 oz)   07/22/18 (!) 154.5 kg (340 lb 11.2 oz)   07/02/18 (!) 151.2 kg (333 lb 4.8 oz)   06/22/18 (!) 154.7 kg (341 lb 0.8 oz)   05/31/18 (!) 153.2 kg (337 lb 11.9 oz)   05/31/18 (!) 152.2 kg (335 lb 8.6 oz)     NAD  RRR, s1s2   CTA B   Abd benign  EXT no edema  Neuro grossly normal  Psych grossly normal    Please excuse typos, dictation completed via Dragon voice recognition software      _____________________________________________________________    Jeremy Anis  [facility]  Division of Cardiology   [facility] office [phone]  Pager [phone]

## 2018-08-12 NOTE — Unmapped (Signed)
Internal Medicine Phone Visit    This visit is conducted via telephone.    Contact Information  Person Contacted: Jeremy Oliver  Contact Phone number: [phone] (home)   Is there someone else in the room? No.   Jeremy Oliver agreed to a phone visit    Jeremy Oliver is a 48 y.o. male PMHx of gastroparesis, HFrEF EF 40-45% (06/01/2018), HLD, HTN, CKD IV w/ persistent hyperkalemia participating in a telephone encounter for his chronic R knee pain.    Reason for Call  Follow up appointment    Subjective:  Has been experiencing pain in his R knee for ~3 mo. Has been seen multiple times for his pain and now states that he can't straighten the knee completely. He has difficulty walking due to his discomfort. He was recently seen in the ED where he had imaging taken of his knee showing osteoarthritis and an effusion, but was unable to have the fluid tapped d/t limited collection. He has a hx of gout, but he says that his pain does not feel like his gout. Additionally he does not describe significant swelling or erythema to the joint typical of a gout flare. He denies hip or ankle pain or loss of mobility in those joints. Currently he is taking tylenol 1000 mg TID for his pain w/ limited relief. He is unable to take NSAIDs given his CKD IV. He has tried voltaren gel for his L leg in the past, but never on his R knee. He denies systemic symptoms including fever, chills, nausea or vomiting.    Of note, the Jeremy Oliver informed me that he was currently sitting in the police station parking lot to file a police report against his son-in-law. He said that all was well, but I offered him any support he needed at this time or in the future.     Voltaren Gel to [facility] pharmacy    I have reviewed the problem list, medications, and allergies and have updated/reconciled them if needed.    Objective:  Jeremy Oliver sounded well. Further physical exam deferred d/t limitations of phone visit.     Assessment & Plan:  1. Recurrent pain of right knee    2. Essential hypertension (RAF-[name])    3. Chronic heart failure with reduced ejection fraction and diastolic dysfunction (CMS-[name])    4. Paroxysmal atrial fibrillation (CMS-[name])    5. Diabetes mellitus with gastroparesis (CMS-[name])    6. CKD (chronic kidney disease) stage 4, GFR 15-29 ml/min (CMS-[name])    7. Hyperkalemia      #)Recurrent Pain of R knee: pain for 3 months w/o significant relief on tylenol. Fluid collection and osteoarthritic changes apparent on previous XR. Jeremy Oliver has been referred to Rheumatology but difficulty scheduling. Currently do not feel that gout is the primary driver of his pain. Given the mechanical issues and lack of typical gout symptoms, would favor mechanical issues associated w/ joint effusion. Certainly believe he will benefit from Rheum f/u, but believe that closer follow up necessary to evaluate for increased effusion.  -Voltaren gel apply 3x daily to R knee  -If no relief w/ voltaren gel, request [facility] appointment for physical evaluation of knee and consider tap to drain fluid  -Rheum consulted previously    Return for Next scheduled follow up.     Staffed with: Willette Alma, MD    I spent 15 minutes on the phone with the Jeremy Oliver. I spent an additional 25 minutes on pre- and post-visit activities.     The Jeremy Oliver was  physically located in [place] or a state in which I am permitted to provide care. The Jeremy Oliver and/or parent/gauardian understood that s/he Callari incur co-pays and cost sharing, and agreed to the telemedicine visit. The visit was completed via phone and/or video, which was appropriate and reasonable under the circumstances given the Jeremy Oliver's presentation at the time.    The Jeremy Oliver and/or parent/guardian has been advised of the potential risks and limitations of this mode of treatment (including, but not limited to, the absence of in-person examination) and has agreed to be treated using telemedicine. The Jeremy Oliver's/Jeremy Oliver's family's questions regarding telemedicine have been answered.     If the phone/video visit was completed in an ambulatory setting, the Jeremy Oliver and/or parent/guardian has also been advised to contact their provider???s office for worsening conditions, and seek emergency medical treatment and/or call 911 if the Jeremy Oliver deems either necessary.

## 2018-08-12 NOTE — Unmapped (Signed)
Previsit complete    Time spent on phone: 5 mins    Reason for visit: follow up     Questions / Concerns that need to be addressed:    General Consent to Treat (GCT) for non-epic video visits only:     Vitals signs, if any:       Diabetes:  ? Regularly checking blood sugars?: no  o If yes, when? Complete log  Date Before Breakfast After Breakfast Before Lunch After Lunch Before Dinner After Dinner Before Bed                                                                                                                                 Hypertension:  ? Have blood pressure cuff at home?: no  ? Regularly checking blood pressure?: no  If yes, complete log  Date Time BP Pulse                                                                           Weight:    Date          Allergies reviewed: Yes    Medication reviewed: Yes    Refills needed, if any:     Travel Screening: completed    Falls Risk: not applicable    Hark (DV) Screening: completed    [name] reviewed and updated in Epic:  Completed    HARK Screening  HARK Screening  Within the last year, have you been humiliated or emotionally abused in other ways by your partner or ex-partner?: No  Within the last year, have you been afraid of your partner or ex-partner?: No  Within the last year, have you been raped or forced to have any kind of sexual activity by your partner or ex-partner?: No  Within the last year, have you been kicked, hit, slapped, or otherwise physically hurt by your partner or ex-partner?: No    AUDIT  AUDIT - C Score (Part 1): 0    PHQ2       PHQ9          GAD7       COPD Assessment

## 2018-08-13 ENCOUNTER — Institutional Professional Consult (permissible substitution): Admit: 2018-08-13 | Discharge: 2018-08-14 | Payer: MEDICARE

## 2018-08-13 DIAGNOSIS — M25561 Pain in right knee: Secondary | ICD-10-CM

## 2018-08-13 DIAGNOSIS — I10 Essential (primary) hypertension: Principal | ICD-10-CM

## 2018-08-13 DIAGNOSIS — N184 Chronic kidney disease, stage 4 (severe): Secondary | ICD-10-CM

## 2018-08-13 DIAGNOSIS — E875 Hyperkalemia: Secondary | ICD-10-CM

## 2018-08-13 DIAGNOSIS — Z794 Long term (current) use of insulin: Secondary | ICD-10-CM

## 2018-08-13 DIAGNOSIS — E1122 Type 2 diabetes mellitus with diabetic chronic kidney disease: Secondary | ICD-10-CM

## 2018-08-13 MED ORDER — SPS (WITH SORBITOL) 15 GRAM-20 GRAM/60 ML ORAL SUSPENSION
ORAL | 0 refills | 0 days | Status: CP
Start: 2018-08-13 — End: 2018-09-03
  Filled 2018-08-13: qty 473, 60d supply, fill #0

## 2018-08-13 MED FILL — VENTOLIN HFA 90 MCG/ACTUATION AEROSOL INHALER: RESPIRATORY_TRACT | 25 days supply | Qty: 18 | Fill #1

## 2018-08-13 MED FILL — BUMETANIDE 2 MG TABLET: 90 days supply | Qty: 90 | Fill #0 | Status: AC

## 2018-08-13 MED FILL — UNIFINE PENTIPS 31 GAUGE X 5/16" NEEDLE (8 MM): 80 days supply | Qty: 400 | Fill #1

## 2018-08-13 MED FILL — SPS (WITH SORBITOL) 15 GRAM-20 GRAM/60 ML ORAL SUSPENSION: 60 days supply | Qty: 473 | Fill #0 | Status: AC

## 2018-08-13 MED FILL — DICLOFENAC 1 % TOPICAL GEL: 17 days supply | Qty: 100 | Fill #0 | Status: AC

## 2018-08-13 MED FILL — UNIFINE PENTIPS 31 GAUGE X 5/16" NEEDLE: 80 days supply | Qty: 400 | Fill #1 | Status: AC

## 2018-08-13 MED FILL — LEVEMIR FLEXTOUCH U-100 INSULIN 100 UNIT/ML (3 ML) SUBCUTANEOUS PEN: 37 days supply | Qty: 15 | Fill #0 | Status: AC

## 2018-08-13 MED FILL — VENTOLIN HFA 90 MCG/ACTUATION AEROSOL INHALER: 25 days supply | Qty: 18 | Fill #1 | Status: AC

## 2018-08-13 MED FILL — LEVEMIR FLEXTOUCH U-100 INSULIN 100 UNIT/ML (3 ML) SUBCUTANEOUS PEN: SUBCUTANEOUS | 37 days supply | Qty: 15 | Fill #0

## 2018-08-13 NOTE — Unmapped (Addendum)
Please take Kayexalate once a week to try to control your potassium levels.      It is important that if you take it and don't have a bowel movement in 24 hours, then please call the clinic to let me know.    Get labs rechecked early next week to recheck your potassium levels at [facility]

## 2018-08-13 NOTE — Unmapped (Signed)
Internal Medicine Phone Visit    This visit is conducted via telephone.    Contact Information  Person Contacted: Patient  Contact Phone number: [phone] (home)   Is there someone else in the room? Yes. What is your relationship? wife. Do you want this person here for the visit? yes.  Patient agreed to a phone visit    Jeremy Oliver is a 48 y.o. male  participating in a telephone encounter.    Reason for Call  Right knee pain    Subjective:  States that he's doing pretty well.  Watching grandson right now.    Still having knee pain.  Using Voltaren gel.  States he has two knots on each side of the knee that are tender.  Can't stretch it out all the way. Knee is not red, looks normal other than the pain. Does not feel like gout to him.        I have reviewed the problem list, medications, and allergies and have updated/reconciled them if needed.    Objective:  General:  Well-sounding, NAD  Physical exam deferred due to telephonic visit    Assessment & Plan:  1. Essential hypertension (RAF-[name])    2. Type 2 diabetes mellitus with stage 4 chronic kidney disease, with long-term current use of insulin (CMS-[name])    3. CKD (chronic kidney disease) stage 4, GFR 15-29 ml/min (CMS-[name])    4. Hyperkalemia    5. Acute pain of right knee        HTN  At goal < 140/90 at appt with Dr. Andrey Farmer yesterday  Continue current regimen    T2DM  BGs 200s at most recently labs, but had been better controlled on other recent labs.   - continue Ozempic  - levemir 40 units daily  - aspart 15 units 3 times/day    CKD and Hyperkalemia  K 5.7 on most recent labs  - start Kayexalate at least once a week starting today  - recheck labs on Monday  - if unable to control K levels as outpatient will need admission    Right knee pain  Not clear that it is gout per his report. Improved with voltaren gel. Possibly osteoarthritis.  Able to move it and not erythematous or warm - less likely infection  If not improved on Monday advised SDC      Return in about 2 weeks (around 08/27/2018).         I spent 18 minutes on the phone with the patient. I spent an additional 3 minutes on pre- and post-visit activities.     The patient was physically located in [place] or a state in which I am permitted to provide care. The patient and/or parent/gauardian understood that s/he [place]maan[place] incur co-pays and cost sharing, and agreed to the telemedicine visit. The visit was completed via phone and/or video, which was appropriate and reasonable under the circumstances given the patient's presentation at the time.    The patient and/or parent/guardian has been advised of the potential risks and limitations of this mode of treatment (including, but not limited to, the absence of in-person examination) and has agreed to be treated using telemedicine. The patient's/patient's family's questions regarding telemedicine have been answered.     If the phone/video visit was completed in an ambulatory setting, the patient and/or parent/guardian has also been advised to contact their provider???s office for worsening conditions, and seek emergency medical treatment and/or call 911 if the patient deems either necessary.

## 2018-08-13 NOTE — Unmapped (Signed)
I called Jeremy Oliver the wife answered the phone. I told her who I was and I asked to speck to Jeremy Oliver if he was available to talk, the wife did not answer the question that was asked. She continued to state I can speak for him y'all always do this when you call. I explained to the wife that the questions that I have to ask where confidential and that I would need to speak with the patient. Jeremy Oliver being to get upset and talk nasty and she hung up the phone. I then told Dr. Hulan Fess of what happen. I was not able to get the triage information that was needed before the provider called.

## 2018-08-15 LAB — VITAMIN D, TOTAL (25OH): Lab: 18.3 — ABNORMAL LOW

## 2018-08-16 NOTE — Unmapped (Signed)
Pt's wife calling states pt's panic attacks are increasing.  He used to wake up about once a week saying, I can't breathe! and then go to sleep. Now pt is doing it more frequently. Woke up at 10am this morning w/ panic attack. No c/o SOB nor CP.    [name] would also like to discuss that they feel when pt doesn't take medications pt is not as drowsy.    Pt does not have a medication he is taking that he feels is making him drowsy. [name] states, maybe it is all of his medications, I don't know.  Pt is still taking all meds per wife.  Pt and wife requesting a video appt this week with PCP if possible. Re: medication for panic attacks and review pt feeling tired.    [name] states BG have been 150s-200s (the normal)  Home Health is removing monitoring equipment b/c unable to come to house d/t pt's daughter Covid19 pandemic concerns. (will not allow HH in her home)   Per pt's wife they will be evicted by June 1-so hope to be living somewhere on their own by then.    Pt with no new complaints today, and wife states pt has had less episodes of emesis.    Will follow up w/ pcp and scheduling

## 2018-08-17 ENCOUNTER — Ambulatory Visit: Admit: 2018-08-17 | Discharge: 2018-08-18 | Payer: MEDICARE

## 2018-08-17 DIAGNOSIS — N184 Chronic kidney disease, stage 4 (severe): Principal | ICD-10-CM

## 2018-08-17 DIAGNOSIS — E7800 Pure hypercholesterolemia, unspecified: Secondary | ICD-10-CM

## 2018-08-17 DIAGNOSIS — E875 Hyperkalemia: Secondary | ICD-10-CM

## 2018-08-17 LAB — BASIC METABOLIC PANEL
ANION GAP: 15 mmol/L (ref 7–15)
BLOOD UREA NITROGEN: 40 mg/dL — ABNORMAL HIGH (ref 7–21)
BUN / CREAT RATIO: 10
CALCIUM: 9.6 mg/dL (ref 8.5–10.2)
CHLORIDE: 104 mmol/L (ref 98–107)
CO2: 20 mmol/L — ABNORMAL LOW (ref 22.0–30.0)
CREATININE: 3.85 mg/dL — ABNORMAL HIGH (ref 0.70–1.30)
EGFR CKD-EPI AA MALE: 20 mL/min/{1.73_m2} — ABNORMAL LOW (ref >=60)
EGFR CKD-EPI NON-AA MALE: 17 mL/min/{1.73_m2} — ABNORMAL LOW (ref >=60)
GLUCOSE RANDOM: 231 mg/dL — ABNORMAL HIGH (ref 70–179)
POTASSIUM: 5.8 mmol/L — ABNORMAL HIGH (ref 3.5–5.0)
SODIUM: 139 mmol/L (ref 135–145)

## 2018-08-17 LAB — BUN / CREAT RATIO: Urea nitrogen/Creatinine:MRto:Pt:Ser/Plas:Qn:: 10

## 2018-08-17 LAB — LDL CHOLESTEROL DIRECT: Cholesterol.in LDL:MCnc:Pt:Ser/Plas:Qn:Direct assay: 77.8

## 2018-08-17 NOTE — Unmapped (Signed)
[facility] Refill Coordination Note    Specialty Medication(s) to be Shipped:   General Specialty: Praluent    Other medication(s) to be shipped:       Jeremy Oliver, DOB: 09/05/1970  Phone: There are no phone numbers on file.      All above HIPAA information was verified with patient.     Completed refill call assessment today to schedule patient's medication shipment from the [facility] [phone]).       Specialty medication(s) and dose(s) confirmed: Regimen is correct and unchanged.   Changes to medications: Jeremy Oliver reports no changes at this time.  Changes to insurance: No  Questions for the pharmacist: No    Confirmed patient received Welcome Packet with first shipment. The patient will receive a drug information handout for each medication shipped and additional FDA Medication Guides as required.       DISEASE/MEDICATION-SPECIFIC INFORMATION        N/A    SPECIALTY MEDICATION ADHERENCE     Medication Adherence    Patient reported X missed doses in the last month:  0  Specialty Medication:  praluent 150mg /ml                Praluent 150 mg/ml: 0 days of medicine on hand       SHIPPING     Shipping address confirmed in Epic.     Delivery Scheduled: Yes, Expected medication delivery date: 05/28.     Medication will be delivered via Same Day Courier to the home address in Epic WAM.    Jeremy Oliver   [facility] Specialty Technician

## 2018-08-18 NOTE — Unmapped (Signed)
I spoke with the pt today.  Pt has agreed to do the appt with Rheumatology as suggested.  I will send pt the contact info for rheumatology so that he can call the schedule this appt./tsp

## 2018-08-20 NOTE — Unmapped (Signed)
I was the supervising physician in the delivery of the service. Tanganika Barradas D Tashari Schoenfelder, MD

## 2018-08-25 MED ORDER — GABAPENTIN 300 MG CAPSULE
ORAL_CAPSULE | Freq: Two times a day (BID) | ORAL | 0 refills | 0 days | Status: CP
Start: 2018-08-25 — End: 2018-09-27
  Filled 2018-08-31: qty 60, 30d supply, fill #0

## 2018-08-25 MED ORDER — ONDANSETRON HCL 4 MG TABLET
ORAL_TABLET | Freq: Three times a day (TID) | ORAL | 0 refills | 0 days | Status: CP | PRN
Start: 2018-08-25 — End: 2018-09-07
  Filled 2018-08-31: qty 30, 10d supply, fill #0

## 2018-08-26 MED FILL — PRALUENT PEN 150 MG/ML SUBCUTANEOUS PEN INJECTOR: SUBCUTANEOUS | 56 days supply | Qty: 4 | Fill #1

## 2018-08-26 MED FILL — PRALUENT PEN 150 MG/ML SUBCUTANEOUS PEN INJECTOR: 56 days supply | Qty: 4 | Fill #1 | Status: AC

## 2018-08-26 NOTE — Unmapped (Signed)
I have already placed referral to [facility] after some back and forth messages with Mr. Kain.      I was the supervising physician in the delivery of the service. Kurtis Bushman, MD

## 2018-08-26 NOTE — Unmapped (Signed)
CCM Intake, return call    PCP Action:referral for medication mngt?      CCM Action:f/u SW re: therapy appt    Patient Action:call CCM prn. continue to take meds (is taking fluoxetine 60mg  po nightly per Jeremy Oliver)      CCM return call to pt.  Pt's wife, Jeremy Oliver answers states pt/Jeremy Oliver is sleeping.  Jeremy Oliver states pt requesting appt w/ Diane SW asap. Pt can do video virtual and has an iphone now. Ph# [phone]    Jeremy Oliver asks re: referral for medication mngt re: pt's depression/anxiety.     Discussed w/ Jeremy Oliver re: [employer] information. She will call re: eviction this month. States entire family needs to be out by June 1st.  Jeremy Oliver also states has started medication for herself/mood and will continue to take medications and is finally sleeping again after being manic for so long.  Discussed importance for her to take care of herself in order to also be able to take care of pt. Jeremy Oliver verbalizes understanding and is hopeful.

## 2018-08-26 NOTE — Unmapped (Signed)
Addended by: Talbert Forest D on: 08/26/2018 04:30 PM     Modules accepted: Orders

## 2018-08-27 NOTE — Unmapped (Signed)
Addended by: Talbert Forest D on: 08/27/2018 09:26 AM     Modules accepted: Orders

## 2018-08-28 NOTE — Unmapped (Signed)
Thank you for your e-Consult.    Jeremy Schanz, MD  to P PSYCHIATRY ADULT E-CONSULTS;  08/27/2018 ??9:25 AM   I am requesting an e-Consult for the following reason: depression, adjustment disorder    My clinical question is: patient has history of chronic heart failure, uncontrolled diabetes and HTN, CKD. ??Has long standing history of depression, adjustment disorder since serious hospitalization requiring ICU stay, ECMO, temporary dialysis. ??Currently taking Prozac, Seroquel. ??Patient feels like depression worse. ??Requesting consultation for additional medication management. ??    A brief summary of the relevant workup and treatments to date is as follows (if already summarized in specific clinic notes then please specify dates): Followed by [facility] (Diane Sydell Axon). ??Has been on Prozac and Seroquel. However has previously stopped and started medications frequently. ??Has factors that contribute to his depression including family strain with daughter and wife, most recently homelessness. ??Asking of increase in prozac, however consulting for other medication options (switches or additional agent)       To summarize: Patient has uncontrolled depression, anxiety, and panic attacks awakening from sleep in the setting of recent illness, homelessness, and intermittent medication noncompliance. He has been on Fluoxetine of higher dosages in the past, so if he reports prior effect at higher dosage would titrate to max dose of 80mg /day OR consider cross titration from Fluoxetine to Sertraline with consolidation of sedating medications (Quetiapine and Gabapentin into just Gabapentin) and reduce pill burden where possible including melatonin if not reported as beneficial.     My recommendations are as follows:   Patient appears to have been treated on/off with Fluoxetine since 2014 with max dose of 80mg . If this medication has predominantly been effective in managing patient's symptoms outside of recent stressors, it would be warranted to increase from 60mg  to 80mg  daily. I see that patient appears to be taking Fluoxetine in evenings, as this is an activating SSRI, he Quinnell note that morning administration helps minimize insomnia with early morning awakenings slightly. The half-life of prozac is long so it is a good medication choice for someone who struggles with medication compliance as missed dosages will not contribute to large fluctuations in mood.     If Fluoxetine has not provided sufficient symptom relief even at higher dosages in the past, it would be warranted at this time to switch to a different SSRI such as sertraline which also has good evidence to provide relief for depression, anxiety, and panic symptoms. A second SSRI trial is warranted as it doesn't look like this patient has tried any SSRI's other than Fluoxetine historically. The cross titration can be expedited given fluoxetine half-life and little risk for withdrawal. Would suggest:   Week 1: Fluoxetine 40mg , Sertraline 50mg   Week 2: Fluoxetine 20mg , Sertraline 100mg   Week 3: STOP Fluoxetine, Sertraline 150mg   Weeks 4-5: Monitor on Sertraline 150mg   Week 6: Sertraline 200mg  if indicated    The next med class to try for anxiety/panic/depression would likely be Venlafaxine in the SNRI class, but would have to monitor this closely given patient's history of HTN. Would not consider switch in classes unless not responded to Sertraline max dose x4-6weeks. Another option to consider would be augmentation with Buspirone which patient had been on historically (2016-2019) at low dosages (max was 22.5mg  total daily dose in the past) which can be titrated to max dose of 60mg  total/day.     During basal antidepressany/anxiolytic med changes, it would be warranted to assist patient with PRN's to better control  episodic escalations in anxiety and panic. Per chart review, patient's wife already reports concern for daytime sedation given medications. He is currently on Quetiapine 50mg  BID PRN for headaches. I understand there is some evidence for quetiapine in refractory migraine treatment, but it must be weighted against risk of sedation and metabolic effects in this high risk patient. The patient is also on Gabapentin 300mg  BID scheduled which can also contribute to daytime sedation. Perhaps if the Gabapentin were titrated further (300-600mg  TID) it could better target anxiety/panic, headache, and insomnia without the metabolic side effects and Quetiapine could be tapered/discontinued. It would be advisable to avoid benzodiazepines in this patient who is high risk for respiratory suppression (given obesity and OSA risk) and delirium (given recent acute illness and polypharmacy).     Would assess the benefit of melatonin 9mg  qHS. If not assisting in sleep, would potentially d/c in lieu of the titrated Gabapentin as above to reduce further pill burden/polypharmacy.     I spent 26-30 minutes in medical consultative discussion and review of medical records, including a written report to the treating provider via electronic health record regarding the condition of this patient.    This e-Consult did include an answerable clinical question and did not recommend a clinic visit.    Amada Jupiter, MD    The recommendations provided in this eConsult are based on the clinical data available to me and are furnished without the benefit of a comprehensive in-person evaluation of the patient. Any new clinical issues or changes in patient status not available to me will need to be taken into account when assessing these recommendations. The ongoing management of this patient is the responsibility of the referring clinician. Please contact me if you have further questions.

## 2018-08-30 ENCOUNTER — Institutional Professional Consult (permissible substitution): Admit: 2018-08-30 | Discharge: 2018-08-31 | Payer: MEDICARE

## 2018-08-30 NOTE — Unmapped (Signed)
[facility]  [address]  [place], [place] [address]  PHONE: [phone]  FAX: [phone]    CLINICAL PHARMACIST PRACTITIONER PHONE NOTE   PHARMACOTHERAPY- Lipid    Referring Provider: Winn Jock, MD  Cardiologist: Hoy Finlay, MD  Primary Care Provider: Kurtis Bushman, MD    Assessment and Plan:     #Coronary artery disease involving native coronary artery of native heart without angina pectoris:  Patient with significant CAD, s/p multiple stents.  Patient is very high risk ASCVD (recent ACS) and on Nyala Kirchner lipid lowering regimen with atorvastatin 80 mg, ezetimibe 10 mg daily and Praluent 150 mg every 2 weeks after the lower dose did not yelled significant results.  Lipids checked after 3 doses of Praluent 150 mg and direct LDL 77.8 mg/dL.  Labs taken 1 days after last injection (half life of Praluent ~12 days when taken with statin).  ?? Continue Praluent 150 mg 1 injection every 2 weeks  ?? Continue atorvastatin 80 mg and ezetimibe 10 mg daily     Hypertriglyceridemia - Patient TG have improved however still elevated  have been extremely elevated.  Patient has been having trouble keeping food down and is being worked up with a gastric emptying study.  Patient's DM not well controlled with HgBA1c-9.5 % 4/4.  He is not taking fish oil at this time.  TG improved from 500 mg/dL to 130 mg/DL   ?? If TG do not improve Zeiner consider Lovaza or Vascepa   ?? Vascepa - tier 4 50% co-insurance   ?? Lovaza not covered under the plan.  ?? Patient has been approved for 2,500 grant from [facility] for hyperlipidemia medications which likely will cover the above agents.        #Medication Adherence/Monitoring:   ?? Follow up labs to be done after 3rd dose of PCSK9   ?? PA approved for Praluent approved through 04/30/19  ?? Approved for [employer] through 04/04/19 $0 co-pay    FOLLOW UP: after work up for his current emesis    This was a 15  minute phone encounter Subjective:   Jeremy Oliver is a 48 y.o. year old male with a history of CAD, ICM, STEMI in 2018 with VT arrest with emergent PCI to mid LAD, Ischemic cardiomyopathy with ICD [facility]), CKD, paroxysmal afib, HTN, HLD, T2DM, and morbid obesity, underwent PCI 03/12/18 with severe mid and distal RCA stenosis s/p drug-eluding stent (DES).    Patient medical management for CAD include Brillinta 90 mg, carvediolol 25 mg, IMDUR 30 mg daily, hydralazine 50 mg TID, he has continued atorvastatin 80 mg, ezetimibe 10 mg and now taking Praluent 150 mg since April.  Spoke with patient's wife who  administers the injection for patient.  Denies any problems sine increasing the dose of Praluent.  Denies injection site reaction.  His injections in [place] were on 5/2, 5/16 and 5/30 and therefore labs taken on 5/14 was 12 days after his previous injection.       It is unclear if patient is taking OTC fish oil capsules currently, wife did not think so today.  He continues to have trouble keeping his food down and is being worked up.       He is on disability and has Medicare plan.  Patient getting Praluent from [facility] and has assistance from [employer].      Allergies/Adverse Events:  Losartan and Morphine    Medications:  Reviewed  and updated. Medication list includes revisions made during today's encounter.    Current Outpatient Medications   Medication Sig Dispense Refill   ??? acetaminophen (TYLENOL) 500 MG tablet TAKE 2 TABLETS (1 000MG ) BY MOUTH EVERY 8 HOURS AS NEEDED FOR PAIN 180 each 3   ??? albuterol HFA 90 mcg/actuation inhaler Inhale 2 puffs into the lungs every 6 (six) hours as needed for wheezing or shortness of breath. 18 g 2   ??? alirocumab (PRALUENT) 150 mg/mL subcutaneous injection Inject 1 mL (150 mg total) under the skin every fourteen (14) days. 4 Syringe 11   ??? allopurinoL (ZYLOPRIM) 100 MG tablet Take 0.5 tablets (50 mg total) by mouth daily. 45 tablet 3   ??? amiodarone (PACERONE) 200 MG tablet TAKE 1 TABLET BY MOUTH ONCE DAILY 90 tablet 3   ??? arm brace Misc 1 each by Miscellaneous route daily. 1 each 0   ??? atorvastatin (LIPITOR) 80 MG tablet Take 1 tablet (80 mg total) by mouth daily. 90 tablet 3   ??? blood sugar diagnostic Strp USE TO CHECK BLOOD SUGAR 4 TIMES DAILY (BEFORE MEALS AND NIGHTLY) 100 each 3   ??? blood-glucose meter kit Use to check blood sugars, #1 meter, #100 strips. Freestyle glucose meter. ICD-9 250.00 1 each 0   ??? blood-glucose sensor (FREESTYLE NAVIGATOR GLUC SENS) Devi Frequency:BID   Dosage:0.0     Instructions:  Note:Dose: 1     ??? bumetanide (BUMEX) 2 MG tablet Take 1 tablet (2 mg total) by mouth daily. 90 tablet 3   ??? carvediloL (COREG) 25 MG tablet TAKE 1 TABLET BY MOUTH WITH THE MORNING MEAL AND TAKE 2 TABLETS WITH THE EVENING MEAL 270 tablet 3   ??? cholecalciferol, vitamin D3, 5,000 unit capsule Take 1 capsule (5,000 Units total) by mouth daily. 100 capsule 0   ??? diclofenac sodium (VOLTAREN) 1 % gel Apply 2 grams topically Three (3) times a day. 100 g 11   ??? empty container Misc Use as directed to dispose of injectable medications 1 each 3   ??? ezetimibe (ZETIA) 10 mg tablet TAKE 1 TABLET BY MOUTH ONCE DAILY 90 each 3   ??? FLUoxetine (PROZAC) 20 MG capsule Take 3 capsules (60 mg total) by mouth nightly. 270 capsule 3   ??? gabapentin (NEURONTIN) 300 MG capsule Take 1 capsule (300 mg total) by mouth two (2) times a day. 60 capsule 0   ??? insulin ASPART (NOVOLOG FLEXPEN U-100 INSULIN) 100 unit/mL (3 mL) injection pen Inject 0.15 mL (15 Units total) under the skin Three (3) times a day with a meal. 15 mL 11   ??? insulin detemir U-100 (LEVEMIR) 100 unit/mL (3 mL) injection pen Inject 0.4 mL (40 Units total) under the skin nightly. 15 mL 3   ??? insulin syringe-needle U-100 0.3 mL 31 gauge x 5/16 Syrg Use to inject insulin twice daily with meals 60 each 0   ??? isosorbide mononitrate (IMDUR) 30 MG 24 hr tablet Take 1 tablet (30 mg total) by mouth daily. 90 tablet 3   ??? melatonin 3 mg cap Take 9 mg by mouth nightly.     ??? nitroglycerin (NITROSTAT) 0.4 MG SL tablet Place 1 tablet (0.4 mg total) under the tongue every five (5) minutes as needed for chest pain. 100 each 3   ??? omeprazole (PRILOSEC) 20 MG capsule Take 1 capsule (20 mg total) by mouth daily. 90 capsule 3   ??? ondansetron (ZOFRAN) 4 MG tablet Take 1 tablet (4 mg total) by  mouth every eight (8) hours as needed for nausea. 30 tablet 0   ??? oxyCODONE-acetaminophen (PERCOCET) 5-325 mg per tablet Take 1 tablet by mouth every eight (8) hours as needed. (Patient not taking: Reported on 08/12/2018) 12 tablet 0   ??? QUEtiapine (SEROQUEL) 50 MG tablet Take 1 tablet (50 mg total) by mouth two (2) times a day as needed (headache). 180 tablet 3   ??? rivaroxaban (XARELTO) 15 mg Tab Take 1 tablet (15 mg total) by mouth daily with evening meal. 90 tablet 3   ??? semaglutide 0.25 mg or 0.5 mg(2 mg/1.5 mL) PnIj Inject 0.5 mg under the skin every seven (7) days. 4.5 mL 3   ??? sevelamer (RENVELA) 800 mg tablet TAKE 2 TABLETS (1600MG ) BY MOUTH THREE TIMES DAILY WITH MEALS 540 tablet 3   ??? sodium polystyrene, SPS, with sorbitol (SPS, WITH SORBITOL,) 15-20 gram/60 mL Susp Take 60 mL (15 g) by mouth once a week. 473 mL 0   ??? ticagrelor (BRILINTA) 90 mg Tab TAKE 1 TABLET BY MOUTH TWICE DAILY 180 tablet 3   ??? traMADol (ULTRAM) 50 mg tablet Take 1 tablet (50 mg total) by mouth every six (6) hours as needed. 30 tablet 0   ??? UNIFINE PENTIPS 31 gauge x 5/16 Ndle USE WITH INSULIN AND VICTOZA 5 TIMES DAILY 400 each 3     No current facility-administered medications for this visit.        Objective:     There were no vitals filed for this visit.       Lab Results   Component Value Date    PRO-BNP 3,080.0 (H) 07/22/2018    PRO-BNP 4,560.0 (H) 07/02/2018    PRO-BNP 1,680.0 (H) 05/31/2018    PRO-BNP 231 (H) 06/08/2014    Creatinine 3.85 (H) 08/17/2018    Creatinine 3.52 (H) 08/12/2018    Creatinine 1.30 06/09/2014    Creatinine 1.56 (H) 06/08/2014    BUN 40 (H) 08/17/2018    BUN 38 (H) 08/12/2018    BUN 24 (H) 06/09/2014    BUN 28 (H) 06/08/2014    Potassium 5.8 (H) 08/17/2018    Potassium 5.7 (H) 08/12/2018    Potassium, Bld 5.5 (H) 03/25/2017    Potassium, Bld 4.6 03/25/2017    Magnesium 1.9 07/22/2018    Magnesium 1.6 07/03/2018    Magnesium 2.0 06/09/2014    Magnesium 1.7 05/12/2011    AST 13 (L) 07/27/2018    AST 12 (L) 07/22/2018    AST 37 06/08/2014    AST 24 07/21/2012    ALT 9 07/27/2018    ALT 9 07/22/2018    ALT 36 06/08/2014    ALT 30 07/21/2012    TSH 4.832 (H) 07/02/2018    TSH 2.29 06/08/2014    Total Bilirubin 0.4 07/27/2018    Total Bilirubin 0.6 07/22/2018    Total Bilirubin 0.8 06/08/2014    Total Bilirubin 0.6 07/21/2012    INR, POC 1.2 02/12/2018    INR, POC 0.9 02/01/2018    INR 1.07 07/02/2018    INR 0.99 05/31/2018    WBC 9.4 08/12/2018    WBC 7.5 06/09/2014    HGB 10.2 (L) 08/12/2018    Hemoglobin 8.1 (L) 03/25/2017    HCT 32.4 (L) 08/12/2018    HCT 37.9 (L) 06/09/2014    Platelet 491 (H) 08/12/2018    Platelet 190 06/09/2014    Triglycerides 248 (H) 08/12/2018    Triglycerides 400 (H) 08/30/2012    HDL 28 (L)  08/12/2018    HDL 35 (L) 08/30/2012    Non-HDL Cholesterol 155 08/12/2018    LDL Calculated 105 (H) 08/12/2018    LDL Direct 77.8 08/17/2018    LDL Direct 239 08/30/2012    LDL Cholesterol, Calculated 264 08/30/2012

## 2018-08-31 ENCOUNTER — Ambulatory Visit
Admit: 2018-08-31 | Discharge: 2018-09-01 | Payer: MEDICARE | Attending: Orthopaedic Surgery | Primary: Orthopaedic Surgery

## 2018-08-31 ENCOUNTER — Ambulatory Visit: Admit: 2018-08-31 | Discharge: 2018-09-13 | Payer: MEDICARE

## 2018-08-31 DIAGNOSIS — K3184 Gastroparesis: Principal | ICD-10-CM

## 2018-08-31 DIAGNOSIS — S6410XA Injury of median nerve at wrist and hand level of unspecified arm, initial encounter: Principal | ICD-10-CM

## 2018-08-31 MED FILL — DICLOFENAC 1 % TOPICAL GEL: TOPICAL | 17 days supply | Qty: 100 | Fill #1

## 2018-08-31 MED FILL — OMEPRAZOLE 20 MG CAPSULE,DELAYED RELEASE: 90 days supply | Qty: 90 | Fill #1 | Status: AC

## 2018-08-31 MED FILL — ONDANSETRON HCL 4 MG TABLET: 10 days supply | Qty: 30 | Fill #0 | Status: AC

## 2018-08-31 MED FILL — NITROGLYCERIN 0.4 MG SUBLINGUAL TABLET: 28 days supply | Qty: 100 | Fill #1 | Status: AC

## 2018-08-31 MED FILL — FLUOXETINE 20 MG CAPSULE: 30 days supply | Qty: 90 | Fill #3 | Status: AC

## 2018-08-31 MED FILL — QUETIAPINE 50 MG TABLET: ORAL | 75 days supply | Qty: 150 | Fill #2

## 2018-08-31 MED FILL — FLUOXETINE 20 MG CAPSULE: ORAL | 30 days supply | Qty: 90 | Fill #3

## 2018-08-31 MED FILL — RENVELA 800 MG TABLET: 30 days supply | Qty: 180 | Fill #3 | Status: AC

## 2018-08-31 MED FILL — NITROGLYCERIN 0.4 MG SUBLINGUAL TABLET: SUBLINGUAL | 28 days supply | Qty: 100 | Fill #1

## 2018-08-31 MED FILL — DICLOFENAC 1 % TOPICAL GEL: 17 days supply | Qty: 100 | Fill #1 | Status: AC

## 2018-08-31 MED FILL — GABAPENTIN 300 MG CAPSULE: 30 days supply | Qty: 60 | Fill #0 | Status: AC

## 2018-08-31 MED FILL — QUETIAPINE 50 MG TABLET: 75 days supply | Qty: 150 | Fill #2 | Status: AC

## 2018-08-31 MED FILL — RENVELA 800 MG TABLET: 30 days supply | Qty: 180 | Fill #3

## 2018-08-31 MED FILL — OMEPRAZOLE 20 MG CAPSULE,DELAYED RELEASE: ORAL | 90 days supply | Qty: 90 | Fill #1

## 2018-08-31 NOTE — Unmapped (Signed)
Rheumatology Clinic Initial Virtual Visit Note     Reason for consult: Jeremy Oliver seen in consultation at the request of Dr. Roma Schanz for evaluation of chronic gout     Assessment and Plan: Jeremy [name] a 48 y.o.  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  Our appointment today was very limited as the patient had just taken Seroquel and was largely unable to participate.  His ongoing knee, ankle,  and lower extremity symptoms [name] be due to to chronic gout versus osteoarthritis (seen on xray), and there Touch be an aspect of leg pain due to chronic edema.  His uric acid Oliver above goal, most recently at 9, and he Oliver on low-dose allopurinol which will need to be titrated.  Unfortunately he Oliver unable to be on colchicine as prophylaxis because of his CKD, and prednisone Oliver not ideal with his insulin-dependent diabetes.  He has not recently had a classic gout flare.  He will have repeat uric acid in 2 weeks with allopurinol titration as needed.  I discussed with his wife that I would like to bring him into clinic for a better in person joint evaluation.  Ideally we would aspirate to assess for gout crystals, but due to his concurrent DOAC and antiplatelet therapy the risk of bleeding prohibit this.  I encouraged her to reach out if he has an acute flare with joint swelling and redness in the meantime.    Chronic gout, and knee osteoarthritis:   - Continue Allopurinol 50mg  daily   - Repeat BMP and uric acid in 2 weeks, titrate allopurinol as appropriate  - Encouraged to call if he has a symptomatic flare as he Oliver not able to be on prophylactic anti-inflammatory medications due to CKD and diabetes.  Would consider prednisone in the setting of a flare as he can titrate his insulin.  - Return to clinic for in person evaluation of joint symptoms as it Oliver unclear if his current symptoms are due to gout versus damage or osteoarthritis     CKD Stage 4-5:   - Follows with nephrology, the patient Oliver unsure about timing of dialysis    Return in about 6 weeks (around 10/14/2018).     I spent 24 minutes on the audio/video with the patient and his wife. I spent an additional 4 minutes on pre- and post-visit activities.     The patient was physically located in [place] or a state in which I am permitted to provide care. The patient and/or parent/gauardian understood that s/he [place] incur co-pays and cost sharing, and agreed to the telemedicine visit. The visit was completed via phone and/or video, which was appropriate and reasonable under the circumstances given the patient's presentation at the time.    The patient and/or parent/guardian has been advised of the potential risks and limitations of this mode of treatment (including, but not limited to, the absence of in-person examination) and has agreed to be treated using telemedicine. The patient's/patient's family's questions regarding telemedicine have been answered.     If the phone/video visit was completed in an ambulatory setting, the patient and/or parent/guardian has also been advised to contact their provider???s office for worsening conditions, and seek emergency medical treatment  and/or call 911 if the patient deems either necessary.    Patient was seen and discussed with Dr. Delton See.     Janae Bridgeman, MD  Rheumatology Fellow PGY4  Pager [phone]    HPI: Jeremy [name] a 48 y.o.  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     The patient Oliver a very poor historian and Oliver lethargic due to recently taking Seroquel for migraine headache.  The majority of the history was collected from his wife who was beside him.  She notes that he has gout for several years.  She notes he has had a knee aspiration at [facility] previously and had gout crystals detected.  He has been on allopurinol and colchicine in the past, the colchicine has been stopped due to progressive renal dysfunction.  She notes that he has had attacks in most all joints in his body.  During the attacks he has severe pain with swelling that causes him to cry.  Most recently he has not had any obvious attacks but has had chronic knee and ankle pain, as well as chronic pain in his lower extremities.  She notes that he has had some mild knee and ankle swelling without redness during this time.  He also has had lower extremity edema that has improved with Bumex.  He has not had any chronic neck or back symptoms.  She cannot recall the last time he had hand swelling or foot swelling.  He has had elbow pain in the past, but does not have any currently.  Currently she Oliver just on allopurinol 50 mg daily without any inflammatory prophylaxis.  He has never been on prednisone for this.    Due to progressive renal dysfunction he has been seeing a nephrologist and [place] end up starting dialysis soon.  The timing remains unclear.  For his diabetes his glucose levels have been better controlled with fasting glucose levels in the 100s per his wife.    ROS: 14 systems were reviewed and are negative except for that mentioned in the HPI.     Allergies: Losartan and Morphine  Immunizations:   Immunization History   Administered Date(s) Administered   ??? INFLUENZA TIV (TRI) PF (IM) 12/09/2007, 12/27/2008, 01/09/2011   ??? Influenza Vaccine Quad (IIV4 PF) 34mo+ injectable 01/09/2012, 12/06/2012, 03/02/2014, 12/21/2014, 12/05/2015, 12/09/2016, 12/04/2017   ??? Novel Influenza-h1n1-09, All Formulations 03/13/2008   ??? PNEUMOCOCCAL POLYSACCHARIDE 23 02/10/2007   ??? TdaP 08/30/2009      PMHx:   Past Medical History:   Diagnosis Date   ??? Angina at rest (CMS-[name])    ??? Arthritis    ??? Asthma    ??? Atrial fibrillation and flutter (CMS-[name]) 06/08/14   ??? BMI 40.0-44.9, adult (CMS-[name]) 10/29/2015   ??? Chronic anticoagulation 02/17/2018   ??? Chronic kidney disease, stage 3 (CMS-[name])    ??? Chronic pain syndrome 04/11/2014    ~Use daily lyrica as recommended ~Tramadol as needed for pain ~Fluoxetine daily as recommended ~Daily exercise ~Eat a healthy diet ~Good control of your blood sugars can help    ??? Coronary artery disease    ??? Depression    ??? Diabetes mellitus (CMS-[name])    ??? Eye trauma 1998    glass in both eyes and removed   ??? GERD (gastroesophageal reflux disease)    ???  Gout    ??? Homeless    ??? Hyperlipidemia    ??? Hypertension    ??? Illiteracy and low-level literacy    ??? Microscopic hematuria    ??? Migraine    ??? Nephrolithiasis    ??? 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 Nonalcoholic fatty liver disease    ??? NSTEMI (non-ST elevated myocardial infarction) (CMS-[name]) 01/13/2017   ??? Obesity    ??? Obstructive sleep apnea 2009   ??? Panic attacks    ??? 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 Seizure-like activity (CMS-[name])     ~Monitor for symptoms and report them to your primary care provider if they occur    ??? Systolic CHF, chronic (CMS-[name])     EF 40-45% 2017   ??? TIA (transient ischemic attack)      PSHx:   Past Surgical History:   Procedure Laterality Date   ??? CARDIAC CATHETERIZATION  03/08/2007   ??? CYSTOURETHROSCOPY  12/31/2009     Microscopic hematuria   ??? KNEE SURGERY Right 09/23/2006    arthroscopic knee surgery medial and lateral meniscus tears as well as crystal disease   ??? PR CATH PLACE/CORON ANGIO, IMG SUPER/INTERP,R&L HRT CATH, L HRT VENTRIC N/A 02/13/2017    Procedure: Left/Right Heart Catheterization;  Surgeon: Marlaine Hind, MD;  Location: [facility] CATH;  Service: Cardiology   ??? PR CATH PLACE/CORON ANGIO, IMG SUPER/INTERP,W LEFT HEART VENTRICULOGRAPHY N/A 03/17/2017    Procedure: Left Heart Catheterization W Intervention;  Surgeon: Marlaine Hind, MD;  Location: [facility] CATH;  Service: Cardiology   ??? PR CATH PLACE/CORON ANGIO, IMG SUPER/INTERP,W LEFT HEART VENTRICULOGRAPHY N/A 05/12/2018    Procedure: CATH LEFT HEART CATHETERIZATION W INTERVENTION;  Surgeon: Dorathy Kinsman, MD;  Location: [facility] CATH;  Service: Cardiology   ??? PR ECMO/ECLS INITIATION VENO-ARTERIAL N/A 03/19/2017    Procedure: ECMO/ECLS; INITIATION, VENO-ARTERIAL;  Surgeon: Lennie Odor, MD;  Location: MAIN OR [facility];  Service: Cardiac Surgery   ??? PR ECMO/ECLS RMVL PRPH CANNULA OPEN 6 YRS & OLDER Left 03/25/2017    Procedure: ECMO / ECLS PROVIDED BY PHYSICIAN; REMOVAL OF PERIPHERAL (ARTERIAL AND/OR VENOUS) CANNULA(E), OPEN, 6 YEARS AND OLDER;  Surgeon: Alonna Buckler Ikonomidis, MD;  Location: MAIN OR [facility];  Service: Cardiac Surgery   ??? PR EPHYS EVAL W/ ABLATION SUPRAVENT ARRHYTHMIA N/A 07/19/2015    Catheter ablation of cavotricuspid isthmus for atrial flutter [facility])   ??? PR INSER HART PACER XVENOUS ATRIAL N/A 05/30/2015    [employer] dual-chamber pacemaker implant (E.Chung)   ??? PR INSERT/PLACE FLOW DIRECT CATH N/A 03/17/2017    Procedure: Insert Leave In [place];  Surgeon: Marlaine Hind, MD;  Location: [facility] CATH;  Service: Cardiology   ??? PR INSJ/RPLCMT PERM DFB W/TRNSVNS LDS 1/DUAL CHMBR N/A 05/04/2017    Procedure: ICD Implant System (Single/Dual);  Surgeon: Eldred Manges, MD;  Location: [facility] CATH;  Service: Cardiology   ??? PR NEGATIVE PRESSURE WOUND THERAPY DME >50 SQ CM Left 03/25/2017    Procedure: Neg Press Wound Tx (Vac Assist) Incl Topicals, Per Session, Tsa Greater Than/= 50 Cm Squared;  Surgeon: Loletta Specter, MD;  Location: MAIN OR [facility];  Service: Cardiac Surgery   ??? PR PRQ TRLUML CORONARY STENT W/ANGIO ONE ART/BRNCH N/A 03/12/2018    Procedure: Percutaneous Coronary Intervention;  Surgeon: Marlaine Hind, MD;  Location: [facility] CATH;  Service: Cardiology   ??? PR REBL VES DIRECT,LOW EXTREM Left 03/19/2017    Procedure: Repr Bld Vessel Direct; Lower Extrem;  Surgeon: Boykin Reaper, MD;  Location: MAIN OR [facility];  Service: Vascular   ??? PR REMV ART CLOT ILIAC-POP,LEG INCIS Left 03/25/2017    Procedure: EMBOLECTOMY OR THROMBECTOMY, WITH OR WITHOUT CATHETER; FEMOROPOPLITEAL, AORTOILIAC ARTERY, BY LEG INCISION;  Surgeon: Alonna Buckler Ikonomidis, MD;  Location: MAIN OR [facility]  Service: Cardiac Surgery   ??? PR UPPER GI ENDOSCOPY,BIOPSY N/A 02/28/2016    Procedure: UGI ENDOSCOPY; WITH BIOPSY, SINGLE OR MULTIPLE;  Surgeon: Liane Comber, MD;  [facility];  Service: Gastroenterology   ??? PR UPPER GI ENDOSCOPY,DIAGNOSIS N/A 05/07/2016    Procedure: UGI ENDO, INCLUDE ESOPHAGUS, STOMACH, & DUODENUM &/OR JEJUNUM; DX W/WO COLLECTION SPECIMN, BY BRUSH OR WASH;  Surgeon: Modena Nunnery, MD;  Location: [facility];  Service: Gastroenterology     Family Hx:   Family History   Problem Relation Age of Onset   ??? Diabetes Mother    ??? Hyperlipidemia Mother    ??? Heart disease Mother    ??? Basal cell carcinoma Mother    ??? Blindness Mother         diabeties   ??? Obesity Mother    ??? Hyperlipidemia Father    ??? Heart disease Father    ??? Lung disease Father    ??? Heart disease Half-Sister    ??? Kidney disease Half-Sister    ??? Gallbladder disease Half-Brother    ??? Obesity Half-Brother    ??? No Known Problems Daughter    ??? No Known Problems Son    ??? Obesity Daughter    ??? Melanoma Neg Hx    ??? Squamous cell carcinoma Neg Hx    ??? Macular degeneration Neg Hx    ??? Glaucoma Neg Hx      Social Hx:   Social History     Socioeconomic History   ??? Marital status: Married     Spouse name: [name]   ??? Number of children: 3   ??? Years of education: 78   ??? Highest education level: Not on file   Occupational History   ??? Occupation: DISABLED     Comment: Former Surveyor, quantity Needs   ??? Financial resource strain: Very hard   ??? Food insecurity     Worry: Not on file     Inability: Not on file   ??? Transportation needs     Medical: Yes     Non-medical: Yes   Tobacco Use   ??? Smoking status: Never Smoker   ??? Smokeless tobacco: Current User     Types: Chew   ??? Tobacco comment: Pt dips 1 can a week, Pt not interested in quitting    Substance and Sexual Activity   ??? Alcohol use: Never     Alcohol/week: 0.0 standard drinks     Frequency: Never     Comment: rare use: couple times a year, small amount   ??? Drug use: No   ??? Sexual activity: Yes     Partners: Female   Lifestyle   ??? Physical activity     Days per week: Not on  file     Minutes per session: Not on file   ??? Stress: Not on file   Relationships   ??? Social Wellsite geologist on phone: More than three times a week     Gets together: More than three times a week     Attends religious service: 1 to 4 times per year     Active member of club or organization: No     Attends meetings of clubs or organizations: Never     Relationship status: Married   Other Topics Concern   ??? Do you use sunscreen? No   ??? Tanning bed use? No   ??? Are you easily burned? No   ??? Excessive sun exposure? No   ??? Blistering sunburns? No   Social History Narrative    Information updated:  01/08/16. Reviewed 08/12/17:        Born in [place]    Raised with both parents    Parents died w/in 6 mos of each other. His parents knew they were going to pass and told him just before.    Half sister and 2 half brothers        Married 30 yrs. Wife [name]    2 other children son and daughter, adopted out    Daughter Bridgette, bf DJ        Education - completed through 11th. 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    Former Presenter, broadcasting since age 30    Lives with wife, difficulty paying bills 12/30/17     Lives next door to daughter, husband and their grandson        05/19/18: pt moving to new home in next 5 days- after an eviction. Has Medicaid assistance again. Will be w/ wife [name], they babysit their grandson often.    06/14/18: Pt and husband living with daughter while new location to live Oliver determined.       Medications:   Current Outpatient Medications   Medication Sig Dispense Refill   ??? acetaminophen (TYLENOL) 500 MG tablet TAKE 2 TABLETS (1 000MG ) BY MOUTH EVERY 8 HOURS AS NEEDED FOR PAIN 180 each 3   ??? albuterol HFA 90 mcg/actuation inhaler Inhale 2 puffs into the lungs every 6 (six) hours as needed for wheezing or shortness of breath. 18 g 2   ??? alirocumab (PRALUENT) 150 mg/mL subcutaneous injection Inject 1 mL (150 mg total) under the skin every fourteen (14) days. 4 Syringe 11   ??? allopurinoL (ZYLOPRIM) 100 MG tablet Take 0.5 tablets (50 mg total) by mouth daily. 45 tablet 3   ??? amiodarone (PACERONE) 200 MG tablet TAKE 1 TABLET BY MOUTH ONCE DAILY 90 tablet 3   ??? arm brace Misc 1 each by Miscellaneous route daily. 1 each 0   ??? atorvastatin (LIPITOR) 80 MG tablet Take 1 tablet (80 mg total) by mouth daily. 90 tablet 3   ??? blood sugar diagnostic Strp USE TO CHECK BLOOD SUGAR 4 TIMES DAILY (BEFORE MEALS AND NIGHTLY) 100 each 3   ??? blood-glucose meter kit Use to check blood sugars, #1 meter, #100 strips. Freestyle glucose meter. ICD-9 250.00 1 each 0   ??? blood-glucose sensor (FREESTYLE NAVIGATOR GLUC SENS) Devi Frequency:BID   Dosage:0.0     Instructions:  Note:Dose: 1     ??? bumetanide (BUMEX) 2 MG tablet Take 1 tablet (2 mg total) by mouth daily. 90 tablet 3   ??? carvediloL (COREG) 25 MG tablet TAKE 1 TABLET BY MOUTH WITH THE MORNING MEAL AND TAKE 2 TABLETS WITH THE EVENING MEAL 270 tablet 3   ??? cholecalciferol, vitamin D3, 5,000 unit capsule Take 1 capsule (5,000 Units total) by mouth daily. 100 capsule 0   ??? diclofenac sodium (VOLTAREN) 1 % gel Apply 2 grams topically Three (3) times a day. 100 g 11   ??? empty container Misc Use as directed to dispose of injectable medications 1 each 3   ??? ezetimibe (ZETIA) 10 mg tablet TAKE 1 TABLET BY MOUTH ONCE DAILY 90 each 3   ??? FLUoxetine (PROZAC) 20 MG capsule Take 3 capsules (60 mg total) by mouth nightly. 270 capsule 3   ??? gabapentin (NEURONTIN) 300 MG capsule Take 1 capsule (300 mg total) by mouth two (2) times a day. 60 capsule 0   ??? insulin ASPART (NOVOLOG FLEXPEN U-100 INSULIN) 100 unit/mL (3 mL) injection pen Inject 0.15 mL (15 Units total) under the skin Three (3) times a day with a meal. 15 mL 11   ??? insulin detemir U-100 (LEVEMIR) 100 unit/mL (3 mL) injection pen Inject 0.4 mL (40 Units total) under the skin nightly. 15 mL 3   ??? insulin syringe-needle U-100 0.3 mL 31 gauge x 5/16 Syrg Use to inject insulin twice daily with meals 60 each 0   ??? isosorbide mononitrate (IMDUR) 30 MG 24 hr tablet Take 1 tablet (30 mg total) by mouth daily. 90 tablet 3   ??? melatonin 3 mg cap Take 9 mg by mouth nightly.     ??? nitroglycerin (NITROSTAT) 0.4 MG SL tablet Place 1 tablet (0.4 mg total) under the tongue every five (5) minutes as needed for chest pain. 100 each 3   ??? omeprazole (PRILOSEC) 20 MG capsule Take 1 capsule (20 mg total) by mouth daily. 90 capsule 3   ??? ondansetron (ZOFRAN) 4 MG tablet Take 1 tablet (4 mg total) by mouth every eight (8) hours as needed for nausea. 30 tablet 0   ??? oxyCODONE-acetaminophen (PERCOCET) 5-325 mg per tablet Take 1 tablet by mouth every eight (8) hours as needed. (Patient not taking: Reported on 08/12/2018) 12 tablet 0   ??? QUEtiapine (SEROQUEL) 50 MG tablet Take 1 tablet (50 mg total) by mouth two (2) times a day as needed (headache). 180 tablet 3   ??? rivaroxaban (XARELTO) 15 mg Tab Take 1 tablet (15 mg total) by mouth daily with evening meal. 90 tablet 3   ??? semaglutide 0.25 mg or 0.5 mg(2 mg/1.5 mL) PnIj Inject 0.5 mg under the skin every seven (7) days. 4.5 mL 3   ??? sevelamer (RENVELA) 800 mg tablet TAKE 2 TABLETS (1600MG ) BY MOUTH THREE TIMES DAILY WITH MEALS 540 tablet 3   ??? sodium polystyrene, SPS, with sorbitol (SPS, WITH SORBITOL,) 15-20 gram/60 mL Susp Take 60 mL (15 g) by mouth once a week. 473 mL 0   ??? ticagrelor (BRILINTA) 90 mg Tab TAKE 1 TABLET BY MOUTH TWICE DAILY 180 tablet 3   ??? traMADol (ULTRAM) 50 mg tablet Take 1 tablet (50 mg total) by mouth every six (6) hours as needed. 30 tablet 0   ??? UNIFINE PENTIPS 31 gauge x 5/16 Ndle USE WITH INSULIN AND VICTOZA 5 TIMES DAILY 400 each 3     No current facility-administered medications for  this visit.        Physical Exam: Limited due to video exam  GEN:  Laying in bed. In no acute distress but lethargic  Chest: No distress on room air  Cardiovascular: RRR, normal s1/s2, no murmurs, rubs, or gallops appreciated.  2+ distal pulses equal bilaterally.  GI:  Soft, NT, ND, normal bowel sounds.  No organomegaly.  No abdominal bruit.  MSK:     Hands: No obvious swelling, able to make a fist bilaterally.   Wrists: Nearly normal range of motion   Knees: Can flex to 90 degrees.  No signs of erythema   Ankles: Bilateral swelling noted, however I cannot tell if this Oliver periarticular or articular.  Limited assessment of range of motion.   Neuro:  Lethargic, responds to commands.   Skin:  Scar around knee.   Psych: unable to assess.     Labs:    Lab Results   Component Value Date    WBC 9.4 08/12/2018    HGB 10.2 (L) 08/12/2018    HCT 32.4 (L) 08/12/2018    PLT 491 (H) 08/12/2018     Lab Results   Component Value Date    NA 138 09/01/2018    K 5.4 (H) 09/01/2018    CL 105 09/01/2018    CO2 21.0 (L) 09/01/2018    BUN 51 (H) 09/01/2018    CREATININE 4.23 (H) 09/01/2018    GLU 173 09/01/2018    CALCIUM 9.3 09/01/2018    MG 1.9 07/22/2018    PHOS 5.1 (H) 08/12/2018     Lab Results   Component Value Date    BILITOT 0.4 07/27/2018    BILIDIR 0.50 (H) 04/07/2017    PROT 7.2 07/27/2018    ALBUMIN 3.8 07/27/2018    ALT 9 07/27/2018    AST 13 (L) 07/27/2018    ALKPHOS 89 07/27/2018    GGT 32 01/09/2012     URIC ACID 08/12/18: 9.0     Imaging:     R Knee xray 06/2018: No fracture. No osteolytic or osteoblastic lesions. No erosions. Normal alignment. There Oliver tricompartmental osteophytosis. Mild medial compartment predominant joint space narrowing. Patellar and tibial tuberosity enthesophytosis. There Oliver diffuse soft tissue swelling about the knee. No dissecting soft tissue gas Oliver noted. There Oliver a moderate right knee effusion.    05/2018:  R Elbow: No acute fracture or dislocation.  Chronic common flexor tendinosis  Mild posterior forearm soft tissue swelling Gatt reflect contusion.

## 2018-08-31 NOTE — Unmapped (Signed)
Placed orders for repeat BMP to be drawn prior to Fri 6/5 virtual appt with me to recheck renal function and K.  Will ask RN CCM to coordinate.

## 2018-08-31 NOTE — Unmapped (Signed)
Dr Jarold Motto has ordered an EMG test for you.  That department typically will call you to schedule.  If you don't receive a call from the EMG department within 48 hours please given them a call at [phone].    Once your EMG test is scheduled call Orthopaedics at [phone] (at the recording press the number 1) to schedule your follow up appointment to be with Dr Jarold Motto.  This needs to be at least 14 days after your EMG.

## 2018-08-31 NOTE — Unmapped (Signed)
Chief Complaint: Right ring and small finger numbness and tingling     HPI   48 y.o. male  right hand dominant who complains of numbness of the small and ring fingers of the right hand.  The patient notes that approximately 2 months ago he had an IV placed into the dorsum of his hand/wrist after collapsing from a bad heart.  Since that time he felt that his right ring finger and small finger have been numb and tingling.  He states the entirety of the fingers.  Of note, the patient coded and required prolonged inpatient hospital stay December 2019.  He does not think that when he discharged from the hospital he had this symptomatology.  He denies any previous surgery to the area.     Prior treatments have consisted of none    Despite this, his symptoms have persisted.     Electrodiagnostic studies not been performed.     Review of Systems: A ten systems ROS by questionnaire was positive for chronic heart failure with pacemaker and defibrillator, uncontrolled diabetes on insulin and oral medication, hypertension, and chronic kidney disease, which are under the care of her primary care physician. Otherwise, a ten systems ROS was negative except as stated in HPI.       Medical History  Past Medical History:   Diagnosis Date   ??? Angina at rest (CMS-[name])    ??? Arthritis    ??? Asthma    ??? Atrial fibrillation and flutter (CMS-[name]) 06/08/14   ??? BMI 40.0-44.9, adult (CMS-[name]) 10/29/2015   ??? Chronic anticoagulation 02/17/2018   ??? Chronic kidney disease, stage 3 (CMS-[name])    ??? Chronic pain syndrome 04/11/2014    ~Use daily lyrica as recommended ~Tramadol as needed for pain ~Fluoxetine daily as recommended ~Daily exercise ~Eat a healthy diet ~Good control of your blood sugars can help    ??? Coronary artery disease    ??? Depression    ??? Diabetes mellitus (CMS-[name])    ??? Eye trauma 1998    glass in both eyes and removed   ??? GERD (gastroesophageal reflux disease)    ??? Gout    ??? Homeless    ??? Hyperlipidemia    ??? Hypertension    ??? Illiteracy and low-level literacy    ??? Microscopic hematuria    ??? Migraine    ??? Nephrolithiasis    ??? 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 Nonalcoholic fatty liver disease    ??? NSTEMI (non-ST elevated myocardial infarction) (CMS-[name]) 01/13/2017   ??? Obesity    ??? Obstructive sleep apnea 2009   ??? Panic attacks    ??? 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 Seizure-like activity (CMS-[name])     ~Monitor for symptoms and report them to your primary care provider if they occur    ??? Systolic CHF, chronic (CMS-[name])     EF 40-45% 2017   ??? TIA (transient ischemic attack)        Surgical History  Past Surgical History:   Procedure Laterality Date   ??? CARDIAC CATHETERIZATION  03/08/2007   ???  CYSTOURETHROSCOPY  12/31/2009     Microscopic hematuria   ??? KNEE SURGERY Right 09/23/2006    arthroscopic knee surgery medial and lateral meniscus tears as well as crystal disease   ??? PR CATH PLACE/CORON ANGIO, IMG SUPER/INTERP,R&L HRT CATH, L HRT VENTRIC N/A 02/13/2017    Procedure: Left/Right Heart Catheterization;  Surgeon: Marlaine Hind, MD;  Location[facility];  Service: Cardiology   ??? PR CATH PLACE/CORON ANGIO, IMG SUPER/INTERP,W LEFT HEART VENTRICULOGRAPHY N/A 03/17/2017    Procedure: Left Heart Catheterization W Intervention;  Surgeon: Marlaine Hind, MD;  Location: [facility] CATH;  Service: Cardiology   ??? PR CATH PLACE/CORON ANGIO, IMG SUPER/INTERP,W LEFT HEART VENTRICULOGRAPHY N/A 05/12/2018    Procedure: CATH LEFT HEART CATHETERIZATION W INTERVENTION;  Surgeon: Dorathy Kinsman, MD;  Location: [facility] CATH; Service: Cardiology   ??? PR ECMO/ECLS INITIATION VENO-ARTERIAL N/A 03/19/2017    Procedure: ECMO/ECLS; INITIATION, VENO-ARTERIAL;  Surgeon: Lennie Odor, MD;  Location: MAIN OR [facility];  Service: Cardiac Surgery   ??? PR ECMO/ECLS RMVL PRPH CANNULA OPEN 6 YRS & OLDER Left 03/25/2017    Procedure: ECMO / ECLS PROVIDED BY PHYSICIAN; REMOVAL OF PERIPHERAL (ARTERIAL AND/OR VENOUS) CANNULA(E), OPEN, 6 YEARS AND OLDER;  Surgeon: Alonna Buckler Ikonomidis, MD;  Location: MAIN OR [facility];  Service: Cardiac Surgery   ??? PR EPHYS EVAL W/ ABLATION SUPRAVENT ARRHYTHMIA N/A 07/19/2015    Catheter ablation of cavotricuspid isthmus for atrial flutter [facility])   ??? PR INSER HART PACER XVENOUS ATRIAL N/A 05/30/2015    [employer] dual-chamber pacemaker implant (E.Chung)   ??? PR INSERT/PLACE FLOW DIRECT CATH N/A 03/17/2017    Procedure: Insert Leave In [place];  Surgeon: Marlaine Hind, MD;  Location: [facility] CATH;  Service: Cardiology   ??? PR INSJ/RPLCMT PERM DFB W/TRNSVNS LDS 1/DUAL CHMBR N/A 05/04/2017    Procedure: ICD Implant System (Single/Dual);  Surgeon: Eldred Manges, MD;  Location: [facility] CATH;  Service: Cardiology   ??? PR NEGATIVE PRESSURE WOUND THERAPY DME >50 SQ CM Left 03/25/2017    Procedure: Neg Press Wound Tx (Vac Assist) Incl Topicals, Per Session, Tsa Greater Than/= 50 Cm Squared;  Surgeon: Alonna Buckler Ikonomidis, MD;  Location: MAIN OR [facility];  Service: Cardiac Surgery   ??? PR PRQ TRLUML CORONARY STENT W/ANGIO ONE ART/BRNCH N/A 03/12/2018    Procedure: Percutaneous Coronary Intervention;  Surgeon: Marlaine Hind, MD;  Location: [facility] CATH;  Service: Cardiology   ??? PR REBL VES DIRECT,LOW EXTREM Left 03/19/2017    Procedure: Repr Bld Vessel Direct; Lower Extrem;  Surgeon: Boykin Reaper, MD;  Location: MAIN OR [facility];  Service: Vascular   ??? PR REMV ART CLOT ILIAC-POP,LEG INCIS Left 03/25/2017    Procedure: EMBOLECTOMY OR THROMBECTOMY, WITH OR WITHOUT CATHETER; FEMOROPOPLITEAL, AORTOILIAC ARTERY, BY LEG INCISION;  Surgeon: Alonna Buckler Ikonomidis, MD;  Location: MAIN OR [facility];  Service: Cardiac Surgery   ??? PR UPPER GI ENDOSCOPY,BIOPSY N/A 02/28/2016    Procedure: UGI ENDOSCOPY; WITH BIOPSY, SINGLE OR MULTIPLE;  Surgeon: Liane Comber, MD;  Location: [facility];  Service: Gastroenterology   ??? PR UPPER GI ENDOSCOPY,DIAGNOSIS N/A 05/07/2016    Procedure: UGI ENDO, INCLUDE ESOPHAGUS, STOMACH, & DUODENUM &/OR JEJUNUM; DX W/WO COLLECTION SPECIMN, BY BRUSH OR WASH;  Surgeon: Modena Nunnery, MD;  Location: [facility];  Service: Gastroenterology       Medications  Reviewed and updated.     Allergies  Allergies   Allergen Reactions   ??? Losartan  Hyperkalemia (K>6)   ??? Morphine Palpitations     Medication error: was administered 20mL instead of 2mL       Social History   Dips 1 can a week    Family History   Non contributory      Physical Examination:    General  Not in acute distress   Alert and oriented x3  Appropriate affect and mood  Appropriate to conversation   No increased work of breathing on room air    Musculoskeletal  Right Upper Extremity:  Skin  -appears pink and well perfused.  Scattered small superficial abrasions noted.  ROM  -active ROM fingers, wrist, elbow is painless.   Inspection/Palpation  - No tenderness to palpation about the elbow, wrist, or hand  Motor/Strength  -Wrist extension, wrist flexion, IO, grip strength 5/5  Sensory  -Decreased sensation over the right ring and small fingers.  Otherwise intact in the median and radial nerve distributions.  Stability  - No gross instability noted at the elbow, wrist, or fingers  Vascular  -fingers are warm, with brisk capillary refill  2+ radial pulse  Provocative testing  -Ulnar nerve provocative testing (Durkin's and Tinel's) negative at cubital tunnel    Left Upper extremity  Skin intact.  Atraumatic.  Nontender patient throughout.  Painless active range of motion of the elbow, wrist, fingers.  Sensation after light touch in the median, ulnar, and radial nerve distributions.  Motor intact AIN, PIN, ulnar nerves.  2+ radial pulse.      Imaging:   Previous right wrist radiographs reviewed without evidence of fracture or dislocation    Assessment:  49 y.o. male right hand dominant with right ring and small finger numbness and tingling pending electrodiagnostic studies      Plan  We had a good discussion of possible treatment options with his today.   We discussed that the numbness and tingling in his right ring and small finger are not consistent with the location at which he said he had his dorsal wrist IV placed.  To further evaluate would like to obtain electrodiagnostic studies of his right upper extremity to evaluate for cubital tunnel syndrome.  The patient will follow-up in clinic if this is been completed.  Patient understands and is in agreement with the plan.    PROCEDURES  None

## 2018-08-31 NOTE — Unmapped (Signed)
CCM Follow-up Call      PCP Action:None     CCM Action:alerted pt to lab work prior to Friday telemedicine visit w/ pcp    Patient Action:labs- states going to [facility] 09/01/18 to complete.      Follow up/Discuss next call: HF, DM    Pt states Governor did not allow eviction for 3 more weeks. Pt and wife also state they are getting closer to getting housing, off the wait list.    Pt is aware of psychiatry referral, discussed positives of having psychiatry visit, verbalizes understanding. Pt denies need for Psych ph# to set up appt.    Pt feels all his anxiety comes from his adult daughter, Clarisse Gouge, and now she is marrying their grandson's father, neither pt nor [name] endorse the marriage. Pt has been taking fluoxetine 60mg  po @ [facility].    Pt had gastric emptying study completed today 7:30am- 12:30pm. States BG went down to 60s had to have a ginger ale- then felt better @ BG>140    Pt denies edema, CP, SOB. Pt states taking all prescribed medications.     Continues to c/o of chronic conditions:  R. Hand pain. (states from piv stick months ago prior to a hospital admission)  Continues to have emesis with exertion. I throw up every time I go down our stairs, and usually throw up when I go up our stairs. pt states if goes up stairs VERY slowly can sometimes forgo the emesis.    Pt has had NO c/o HA for over a month.  Pt states melatonin 9mg  po @[facility]  has not helped w/ sleep lately.    Pt states r. Knee is getting worse.     Pt states needs knee r. knee replacemnt - has had it already on left knee and was effective.      Pt aware of labs and telemedicine this week.

## 2018-09-01 ENCOUNTER — Ambulatory Visit: Admit: 2018-09-01 | Discharge: 2018-09-02 | Payer: MEDICARE

## 2018-09-01 DIAGNOSIS — E875 Hyperkalemia: Secondary | ICD-10-CM

## 2018-09-01 DIAGNOSIS — N184 Chronic kidney disease, stage 4 (severe): Principal | ICD-10-CM

## 2018-09-01 LAB — BASIC METABOLIC PANEL
ANION GAP: 12 mmol/L (ref 7–15)
BLOOD UREA NITROGEN: 51 mg/dL — ABNORMAL HIGH (ref 7–21)
CALCIUM: 9.3 mg/dL (ref 8.5–10.2)
CHLORIDE: 105 mmol/L (ref 98–107)
CREATININE: 4.23 mg/dL — ABNORMAL HIGH (ref 0.70–1.30)
EGFR CKD-EPI AA MALE: 18 mL/min/{1.73_m2} — ABNORMAL LOW (ref >=60)
EGFR CKD-EPI NON-AA MALE: 15 mL/min/{1.73_m2} — ABNORMAL LOW (ref >=60)
GLUCOSE RANDOM: 173 mg/dL (ref 70–179)
POTASSIUM: 5.4 mmol/L — ABNORMAL HIGH (ref 3.5–5.0)
SODIUM: 138 mmol/L (ref 135–145)

## 2018-09-01 LAB — EGFR CKD-EPI NON-AA MALE: Lab: 15 — ABNORMAL LOW

## 2018-09-01 NOTE — Unmapped (Signed)
I was the supervising physician in the delivery of the service. Talita Recht D Dewayne Severe, MD

## 2018-09-02 ENCOUNTER — Telehealth: Admit: 2018-09-02 | Discharge: 2018-09-03 | Payer: MEDICARE

## 2018-09-02 DIAGNOSIS — M109 Gout, unspecified: Principal | ICD-10-CM

## 2018-09-02 DIAGNOSIS — M1A0621 Idiopathic chronic gout, left knee, with tophus (tophi): Secondary | ICD-10-CM

## 2018-09-02 NOTE — Unmapped (Signed)
This was a telehealth service where a resident was involved. I saw and evaluated the patient via real-time audio/video connection, participating in the key portions of the service.  I reviewed the resident's note.  I agree with the resident's findings and plan. Lynann Beaver MD [facility]

## 2018-09-02 NOTE — Unmapped (Signed)
Attending Attestation:  I saw and evaluated the patient, participating in the key portions of the service.     I determined the assessment and plan of care for the patient.  I reviewed and agree with the documented findings and plan in the resident's note above.  --J. Megan M. Marvis Saefong, MD

## 2018-09-03 ENCOUNTER — Telehealth: Admit: 2018-09-03 | Discharge: 2018-09-04 | Payer: MEDICARE

## 2018-09-03 DIAGNOSIS — E1122 Type 2 diabetes mellitus with diabetic chronic kidney disease: Secondary | ICD-10-CM

## 2018-09-03 DIAGNOSIS — Z794 Long term (current) use of insulin: Secondary | ICD-10-CM

## 2018-09-03 DIAGNOSIS — E875 Hyperkalemia: Secondary | ICD-10-CM

## 2018-09-03 DIAGNOSIS — N184 Chronic kidney disease, stage 4 (severe): Secondary | ICD-10-CM

## 2018-09-03 DIAGNOSIS — F5105 Insomnia due to other mental disorder: Principal | ICD-10-CM

## 2018-09-03 DIAGNOSIS — F99 Mental disorder, not otherwise specified: Secondary | ICD-10-CM

## 2018-09-03 DIAGNOSIS — F3342 Major depressive disorder, recurrent, in full remission: Secondary | ICD-10-CM

## 2018-09-03 MED ORDER — SPS (WITH SORBITOL) 15 GRAM-20 GRAM/60 ML ORAL SUSPENSION
ORAL | 3 refills | 0 days | Status: CP
Start: 2018-09-03 — End: ?

## 2018-09-03 MED ORDER — INSULIN ASPART (U-100) 100 UNIT/ML (3 ML) SUBCUTANEOUS PEN
Freq: Three times a day (TID) | SUBCUTANEOUS | 11 refills | 0.00000 days
Start: 2018-09-03 — End: 2018-09-09

## 2018-09-03 MED ORDER — TRAZODONE 50 MG TABLET
ORAL_TABLET | Freq: Every evening | ORAL | 3 refills | 0 days | Status: CP
Start: 2018-09-03 — End: 2018-09-03

## 2018-09-03 MED ORDER — FLUOXETINE 40 MG CAPSULE
ORAL_CAPSULE | Freq: Every day | ORAL | 3 refills | 0 days | Status: CP
Start: 2018-09-03 — End: 2019-09-03
  Filled 2018-09-14: qty 180, 90d supply, fill #0

## 2018-09-03 NOTE — Unmapped (Signed)
Please call the kidney clinic to schedule your follow-up.   Keep taking Kayexylate once a week to help manage your potassium levels.     For depression, I sent in prescription to increase Prozac to 80 mg once a day.   I cannot prescribe the trazodone due to interaction with Prozac that can have an effect on your heart.     I am putting in a referral to a GI doctor for evaluation of the vomiting.   The gastric emptying study was normal.

## 2018-09-03 NOTE — Unmapped (Signed)
Patient's wife stated they are about to be on the streets within 3 weeks. She stated this is the way that the patient copes with everything.     Previsit complete    Time spent on phone: 10 mins     Reason for visit: follow up     Questions / Concerns that need to be addressed: see note above    General Consent to Treat (GCT) for non-epic video visits only: Verbal consent    Vitals signs, if any:       Diabetes:  ? Regularly checking blood sugars?: yes  o If yes, when? Complete log  Date Before Breakfast After Breakfast Before Lunch After Lunch Before Dinner After Dinner Before Bed    06/05 150                                                                                                                              Allergies reviewed: Yes    Medication reviewed: Yes    Refills needed, if any:     Travel Screening: completed    Falls Risk: not applicable    Hark (DV) Screening: not applicable    [name] reviewed and updated in Epic:  Completed    AUDIT  AUDIT - C Score (Part 1): 0      PHQ9  Thoughts that you would be better off dead, or of hurting yourself in some way: Not at all  PHQ-9 TOTAL SCORE: 13    GAD7  GAD-7 Total Score: 21

## 2018-09-03 NOTE — Unmapped (Signed)
Internal Medicine Video Visit    This visit is conducted via video conferencing.    Contact Information  Person Contacted: Patient  Contact Phone number: [phone] (home)   Is there someone else in the room? No.   Patient agreed to a video visit    [name] is a 48 y.o. male  participating in a video visit.    Reason for visit:  Leg soreness  Housing insecurity  Vomitting    Subjective:  Getting evicted in 3 weeks.    Had rheumatology e-visit and will go in in-person in 3 months.  States he has leg soreness in both legs.  Legs are not swollen.  Still has burning in foot with foot drop.   Wife concerned with depression.  [name] states that he is not depressed.      I have reviewed the problem list, medications, and allergies and have updated/reconciled them if needed.    Objective:  General:  Tired appearing. Disheveled   Resp:  Speaking in complete sentences.  NWOB on room air  PSYCH;  Withdrawn, less engaged in coversation    Assessment & Plan:  1. Insomnia due to other mental disorder    2. Recurrent major depressive disorder, in full remission (CMS-[name])    3. Hyperkalemia    4. CKD (chronic kidney disease) stage 4, GFR 15-29 ml/min (CMS-[name])    5. Type 2 diabetes mellitus with stage 4 chronic kidney disease, with long-term current use of insulin (CMS-[name])        1. Insomnia due to other mental disorder  - traZODone (DESYREL) 50 MG tablet; Take 1 tablet (50 mg total) by mouth nightly.  Dispense: 90 tablet; Refill: 3  - discussed switching prozac to AM, however states it has a sedating effect which is why he takes it at night  -- continue melatonin    2. Recurrent major depressive disorder, in full remission (CMS-[name])  PHQ-9 Score:  PHQ-9 TOTAL SCORE: 13  Have completed e-consult with psychiatry  - FLUoxetine (PROZAC) 40 MG capsule; Take 2 capsules (80 mg total) by mouth daily.  Dispense: 180 capsule; Refill: 3  - requested trazodone but had to d/c due to risk of increased QTc with prozac.   - close f/u in 2 weeks 3. Hyperkalemia  Continue to take Kayexylate to help with   - sodium polystyrene, SPS, with sorbitol (SPS, WITH SORBITOL,) 15-20 gram/60 mL Susp; Take 60 mL (15 g) by mouth once a week.  Dispense: 473 mL; Refill: 3    4. CKD (chronic kidney disease) stage 4, GFR 15-29 ml/min (CMS-[name])  Continues to have issues with hyperkalemia as noted above. Renal function relatively stable.   Again encouraged f/u with [facility] as HD is likely in his future    5. Type 2 diabetes mellitus with stage 4 chronic kidney disease, with long-term current use of insulin (CMS-[name])  Much better based on random BGs  -- wife and self-increased Novolog 15--> 20 units.    -- continue ozempic  -- continue levemir 40units qd  Lab Results   Component Value Date    A1C 9.5 (H) 07/02/2018   - recheck A1c at beginning of July  - insulin ASPART (NOVOLOG FLEXPEN U-100 INSULIN) 100 unit/mL (3 mL) injection pen; Inject 0.2 mL (20 Units total) under the skin Three (3) times a day with a meal.  Dispense: 15 mL; Refill: 11    Recurrent emesis  Reports continued emesis of undigested food.  Gastric emptying study was  normal.    - referred to GI for further evaluation  -- if EGD is required, also requesting colonoscopy for his colon cancer screening at the same time    Return in about 2 weeks (around 09/17/2018).           I spent 35 minutes on the audio/video with the patient. I spent an additional 10 minutes on pre- and post-visit activities.     The patient was physically located in [place] or a state in which I am permitted to provide care. The patient and/or parent/gauardian understood that s/he [place] incur co-pays and cost sharing, and agreed to the telemedicine visit. The visit was completed via phone and/or video, which was appropriate and reasonable under the circumstances given the patient's presentation at the time.    The patient and/or parent/guardian has been advised of the potential risks and limitations of this mode of treatment (including, but not limited to, the absence of in-person examination) and has agreed to be treated using telemedicine. The patient's/patient's family's questions regarding telemedicine have been answered.     If the phone/video visit was completed in an ambulatory setting, the patient and/or parent/guardian has also been advised to contact their provider???s office for worsening conditions, and seek emergency medical treatment and/or call 911 if the patient deems either necessary.

## 2018-09-07 MED ORDER — INSULIN ASPART (U-100) 100 UNIT/ML (3 ML) SUBCUTANEOUS PEN
Freq: Three times a day (TID) | SUBCUTANEOUS | 11 refills | 0.00000 days | Status: CP
Start: 2018-09-07 — End: 2018-09-09

## 2018-09-08 NOTE — Unmapped (Signed)
I was the supervising physician in the delivery of the service. Pasqual Farias D Jazsmin Couse, MD

## 2018-09-08 NOTE — Unmapped (Signed)
CCM Follow-up Call    PCP Action:None     Pt's wife states left knee brace is bothering pt.  Denies need for [facility] video appt, States pt can wait to discuss until video appt w/ PCP next week.  Pt still unable to have HH PT assess situation @ home b/c pt's daughter, Clarisse Gouge will not allow visitors.    Wife was also calling b/c frustrated with having (herself) to leave [facility] the other day without being seen.  Denies need to reschedule appt for herself today. CCM asked pt's wife to call back tomorrow when feels like discussing current complaints re: ear and belly button.

## 2018-09-09 MED ORDER — ONDANSETRON HCL 4 MG TABLET
ORAL_TABLET | Freq: Three times a day (TID) | ORAL | 0 refills | 0.00000 days | Status: CP | PRN
Start: 2018-09-09 — End: 2018-09-14

## 2018-09-09 MED ORDER — INSULIN ASPART (U-100) 100 UNIT/ML (3 ML) SUBCUTANEOUS PEN
Freq: Three times a day (TID) | SUBCUTANEOUS | 11 refills | 0 days
Start: 2018-09-09 — End: 2018-09-10

## 2018-09-10 MED ORDER — INSULIN ASPART (U-100) 100 UNIT/ML (3 ML) SUBCUTANEOUS PEN
Freq: Three times a day (TID) | SUBCUTANEOUS | 11 refills | 25 days | Status: CP
Start: 2018-09-10 — End: ?
  Filled 2018-09-14: qty 15, 25d supply, fill #0

## 2018-09-10 NOTE — Unmapped (Signed)
Jeremy Oliver requesting insulin 20 u [place] be sent to [facility]- done.

## 2018-09-10 NOTE — Unmapped (Signed)
Medication refill novolog 20 u [place] tid- done per protocol

## 2018-09-10 NOTE — Unmapped (Signed)
Called patient set up an appointment with [facility] . Patients number has been disconnected.

## 2018-09-14 ENCOUNTER — Ambulatory Visit: Admit: 2018-09-14 | Discharge: 2018-09-15 | Disposition: A | Discharge status: Home / Self Care | Payer: MEDICARE

## 2018-09-14 LAB — CBC W/ AUTO DIFF
BASOPHILS ABSOLUTE COUNT: 0.1 10*9/L (ref 0.0–0.1)
EOSINOPHILS ABSOLUTE COUNT: 0.3 10*9/L (ref 0.0–0.4)
EOSINOPHILS RELATIVE PERCENT: 2.3 %
HEMATOCRIT: 35.8 % — ABNORMAL LOW (ref 41.0–53.0)
HEMOGLOBIN: 11.4 g/dL — ABNORMAL LOW (ref 13.5–17.5)
LARGE UNSTAINED CELLS: 2 % (ref 0–4)
LYMPHOCYTES ABSOLUTE COUNT: 2 10*9/L (ref 1.5–5.0)
LYMPHOCYTES RELATIVE PERCENT: 17.8 %
MEAN CORPUSCULAR HEMOGLOBIN CONC: 31.8 g/dL (ref 31.0–37.0)
MEAN CORPUSCULAR HEMOGLOBIN: 25.2 pg — ABNORMAL LOW (ref 26.0–34.0)
MEAN CORPUSCULAR VOLUME: 79.3 fL — ABNORMAL LOW (ref 80.0–100.0)
MEAN PLATELET VOLUME: 6.5 fL — ABNORMAL LOW (ref 7.0–10.0)
MONOCYTES ABSOLUTE COUNT: 0.5 10*9/L (ref 0.2–0.8)
MONOCYTES RELATIVE PERCENT: 4.2 %
NEUTROPHILS ABSOLUTE COUNT: 8.3 10*9/L — ABNORMAL HIGH (ref 2.0–7.5)
NEUTROPHILS RELATIVE PERCENT: 73.6 %
PLATELET COUNT: 397 10*9/L (ref 150–440)
RED CELL DISTRIBUTION WIDTH: 17.1 % — ABNORMAL HIGH (ref 12.0–15.0)
WBC ADJUSTED: 11.2 10*9/L — ABNORMAL HIGH (ref 4.5–11.0)

## 2018-09-14 LAB — COMPREHENSIVE METABOLIC PANEL
ALKALINE PHOSPHATASE: 96 U/L (ref 38–126)
ALT (SGPT): 10 U/L (ref <50)
ANION GAP: 12 mmol/L (ref 7–15)
AST (SGOT): 15 U/L — ABNORMAL LOW (ref 19–55)
BILIRUBIN TOTAL: 0.3 mg/dL (ref 0.0–1.2)
BLOOD UREA NITROGEN: 33 mg/dL — ABNORMAL HIGH (ref 7–21)
BUN / CREAT RATIO: 9
CALCIUM: 9.8 mg/dL (ref 8.5–10.2)
CO2: 21 mmol/L — ABNORMAL LOW (ref 22.0–30.0)
CREATININE: 3.76 mg/dL — ABNORMAL HIGH (ref 0.70–1.30)
EGFR CKD-EPI AA MALE: 20 mL/min/{1.73_m2} — ABNORMAL LOW (ref >=60)
EGFR CKD-EPI NON-AA MALE: 18 mL/min/{1.73_m2} — ABNORMAL LOW (ref >=60)
GLUCOSE RANDOM: 141 mg/dL (ref 70–179)
POTASSIUM: 5.8 mmol/L — ABNORMAL HIGH (ref 3.5–5.0)
PROTEIN TOTAL: 7.8 g/dL (ref 6.5–8.3)
SODIUM: 140 mmol/L (ref 135–145)

## 2018-09-14 LAB — LIPASE: Triacylglycerol lipase:CCnc:Pt:Ser/Plas:Qn:: 138

## 2018-09-14 LAB — BUN / CREAT RATIO: Urea nitrogen/Creatinine:MRto:Pt:Ser/Plas:Qn:: 9

## 2018-09-14 LAB — MONOCYTES ABSOLUTE COUNT: Lab: 0.5

## 2018-09-14 MED ORDER — ULTRA SLEEP (DOXYLAMINE SUCCINATE) 25 MG TABLET
ORAL_TABLET | Freq: Three times a day (TID) | ORAL | 0 refills | 0 days | Status: SS | PRN
Start: 2018-09-14 — End: 2018-09-22

## 2018-09-14 MED ORDER — ONDANSETRON HCL 4 MG TABLET
ORAL_TABLET | Freq: Three times a day (TID) | ORAL | 0 refills | 0 days | Status: CP | PRN
Start: 2018-09-14 — End: ?
  Filled 2018-09-14: qty 30, 10d supply, fill #0

## 2018-09-14 MED FILL — XARELTO 15 MG TABLET: 90 days supply | Qty: 90 | Fill #1 | Status: AC

## 2018-09-14 MED FILL — LEVEMIR FLEXTOUCH U-100 INSULIN 100 UNIT/ML (3 ML) SUBCUTANEOUS PEN: 37 days supply | Qty: 15 | Fill #1 | Status: AC

## 2018-09-14 MED FILL — AMIODARONE 200 MG TABLET: ORAL | 90 days supply | Qty: 90 | Fill #1

## 2018-09-14 MED FILL — VENTOLIN HFA 90 MCG/ACTUATION AEROSOL INHALER: 25 days supply | Qty: 18 | Fill #2 | Status: AC

## 2018-09-14 MED FILL — BRILINTA 90 MG TABLET: 90 days supply | Qty: 180 | Fill #1 | Status: AC

## 2018-09-14 MED FILL — NOVOLOG FLEXPEN U-100 INSULIN ASPART 100 UNIT/ML (3 ML) SUBCUTANEOUS: 25 days supply | Qty: 15 | Fill #0 | Status: AC

## 2018-09-14 MED FILL — LEVEMIR FLEXTOUCH U-100 INSULIN 100 UNIT/ML (3 ML) SUBCUTANEOUS PEN: SUBCUTANEOUS | 37 days supply | Qty: 15 | Fill #1

## 2018-09-14 MED FILL — DICLOFENAC 1 % TOPICAL GEL: TOPICAL | 17 days supply | Qty: 100 | Fill #2

## 2018-09-14 MED FILL — VENTOLIN HFA 90 MCG/ACTUATION AEROSOL INHALER: RESPIRATORY_TRACT | 25 days supply | Qty: 18 | Fill #2

## 2018-09-14 MED FILL — FLUOXETINE 40 MG CAPSULE: 90 days supply | Qty: 180 | Fill #0 | Status: AC

## 2018-09-14 MED FILL — ATORVASTATIN 80 MG TABLET: ORAL | 90 days supply | Qty: 90 | Fill #2

## 2018-09-14 MED FILL — AMIODARONE 200 MG TABLET: 90 days supply | Qty: 90 | Fill #1 | Status: AC

## 2018-09-14 MED FILL — BRILINTA 90 MG TABLET: ORAL | 90 days supply | Qty: 180 | Fill #1

## 2018-09-14 MED FILL — ACETAMINOPHEN 500 MG TABLET: 30 days supply | Qty: 180 | Fill #2

## 2018-09-14 MED FILL — DICLOFENAC 1 % TOPICAL GEL: 17 days supply | Qty: 100 | Fill #2 | Status: AC

## 2018-09-14 MED FILL — XARELTO 15 MG TABLET: ORAL | 90 days supply | Qty: 90 | Fill #1

## 2018-09-14 MED FILL — ISOSORBIDE MONONITRATE ER 30 MG TABLET,EXTENDED RELEASE 24 HR: 90 days supply | Qty: 90 | Fill #2 | Status: AC

## 2018-09-14 MED FILL — ATORVASTATIN 80 MG TABLET: 90 days supply | Qty: 90 | Fill #2 | Status: AC

## 2018-09-14 MED FILL — ONDANSETRON HCL 4 MG TABLET: 10 days supply | Qty: 30 | Fill #0 | Status: AC

## 2018-09-14 MED FILL — ISOSORBIDE MONONITRATE ER 30 MG TABLET,EXTENDED RELEASE 24 HR: ORAL | 90 days supply | Qty: 90 | Fill #2

## 2018-09-14 MED FILL — ACETAMINOPHEN 500 MG TABLET: 30 days supply | Qty: 180 | Fill #2 | Status: AC

## 2018-09-14 NOTE — Unmapped (Signed)
[facility] Refill Coordination Note    Specialty Medication(s) to be Shipped:   General Specialty: Praluent    Other medication(s) to be shipped:       Jeremy Oliver, DOB: Mar 24, 1971  Phone: [phone] (home)       All above HIPAA information was verified with patient.     Completed refill call assessment today to schedule patient's medication shipment from the [facility] [phone]).       Specialty medication(s) and dose(s) confirmed: Regimen is correct and unchanged.   Changes to medications: Jeremy Oliver reports no changes at this time.  Changes to insurance: No  Questions for the pharmacist: No    Confirmed patient received Welcome Packet with first shipment. The patient will receive a drug information handout for each medication shipped and additional FDA Medication Guides as required.       DISEASE/MEDICATION-SPECIFIC INFORMATION        N/A    SPECIALTY MEDICATION ADHERENCE     Medication Adherence    Patient reported X missed doses in the last month:  0  Specialty Medication:  Praluent 150mg /ml  Patient is on additional specialty medications:  No                Praluent 150 mg/ml: 0 days of medicine on hand       SHIPPING     Shipping address confirmed in Epic.     Delivery Scheduled: Yes, Expected medication delivery date: 06/24.     Medication will be delivered via Same Day Courier to the home address in Epic WAM.    Antonietta Barcelona   [facility] Specialty Technician

## 2018-09-15 ENCOUNTER — Telehealth: Admit: 2018-09-15 | Discharge: 2018-09-16 | Payer: MEDICARE | Attending: Clinical | Primary: Clinical

## 2018-09-15 DIAGNOSIS — F439 Reaction to severe stress, unspecified: Principal | ICD-10-CM

## 2018-09-15 DIAGNOSIS — F4320 Adjustment disorder, unspecified: Secondary | ICD-10-CM

## 2018-09-15 NOTE — Unmapped (Addendum)
The [facility] Personal Health Advocate Pool received a case management referral message for this Value Care Priority patient. Message forwarded via in-basket message to: [facility] Personal Health Advocate Case Manager Reynaldo Minium, RN for follow up with patient.            ----- Message from Audree Camel, RN sent at 09/14/2018  6:26 PM EDT -----  Regarding: Value Care Patient Notification  (Directions: Please send to [facility] Personal Health Advocate Program Pool)    Pt presents with emesis that started an hr ago. Pt with no other symptoms.    Patient currently in process of ED at [facility], spouse states that they live with daughter currently but they are all scheduled for eviction on 09/27/18, she has been working with housing and is number [phone] on the list, she is desperate for help. Feels helpless with this situation.    I recently cared for one of the Value Care patients. Patient has video  follow-up on Friday.     If you have any questions please contact me at [phone].    Adline Potter RN CCM   ED Case manager for [facility]  September 14, 2018 6:30 PM

## 2018-09-15 NOTE — Unmapped (Signed)
Pt presents with emesis that started an hr ago. Pt with no other symptoms.

## 2018-09-15 NOTE — Unmapped (Signed)
Care Management  Initial Transition Planning Assessment    Patient has been identified as a Runner, broadcasting/film/video.  Requested that a  timely follow-up appointment be scheduled and documented prior to discharge.  Longitudinal Plan of Care reviewed, the following information is noted for continuity of care:      Pt presents with emesis that started an hr ago. Pt with no other symptoms     CM initial assessment completed telephonically as a precautionary measure in accordance with [facility] COVID-19 Pandemic emergency response plan. Patient representative spouse [name] verbalized understanding and agreement with completing this assessment by telephone.     Home Safety Screening  Patient Questions:    1.  Before the illness or injury that brought you to the ED, did you need someone to help you on a regular basis? Do you need people to help you with activities of daily living?  ?? Yes -  spouse  2.  In the last 24 hours, have you needed more help than usual?  ?? Yes -    3.  Have you been hospitalized for one or more nights during the past six months?   ?? Yes - early April  4.  Have you been to an emergency room one or more times in the past six months?  ?? Yes - most recent 07/27/18  5.  In general, do you have problems with your vision that cannot be corrected with glasses/contacts?  ?? No  6.  In general, do you have problems with your memory?  ?? No  7.  Do you take six or more different medications every day?   ?? Yes -  see chart  8. Do you have any trouble getting, taking, keeping up with or paying for your medications?   ?? No  9   Have you had any falls over the past few weeks or are you concerned about falling?  ?? Yes - fell back on bed 1-2 weeks ago  10.  Are you having trouble accessing and/or preparing the meals you need?  ?? Yes - receives 355.00 in FS monthly, cooks meal every once in awhile but mainly eats sandwiches  11. Do you have wounds or bed ulcers?  ?? No  12  Do you have access to transportation?    Yes - spouse    Care Manager Questions:  Is the patient over the age of 45?  ?? No  *Home Safety Evaluation endorsed  7 of the screening items and   will update personal health advocate     Personal Health Advocate Pool contacted by In Basket message regarding post-acute services and collaboration around plan of care. PCP and CM assigned to patient in basket msg sent as well.        Type of Residence: Mailing Address:  [address]  [place]  [place] [address]  Contacts:    Patient Phone Number: [phone]            Medical Provider(s): Kurtis Bushman, MD  Reason for Admission: Admitting Diagnosis:  No admission diagnoses are documented for this encounter.  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  Past Surgical History:   has a past surgical history that includes Knee surgery (Right, 09/23/2006); Cardiac catheterization (03/08/2007); Cystourethroscopy (12/31/2009 ); pr inser hart pacer xvenous atrial (N/A, 05/30/2015); pr ephys eval w/ ablation supravent arrhythmia (N/A, 07/19/2015); pr upper gi endoscopy,biopsy (N/A, 02/28/2016); pr upper gi endoscopy,diagnosis (N/A, 05/07/2016); pr cath place/coron angio, img super/interp,r&l hrt cath, l hrt ventric (N/A, 02/13/2017); pr cath place/coron angio, img super/interp,w left heart ventriculography (N/A, 03/17/2017); pr insert/place flow direct cath (N/A, 03/17/2017); pr ecmo/ecls initiation veno-arterial (N/A, 03/19/2017); pr rebl ves direct,low extrem (Left, 03/19/2017); pr ecmo/ecls rmvl prph cannula open 6 yrs & older (Left, 03/25/2017); pr remv art clot iliac-pop,leg incis (Left, 03/25/2017); pr negative pressure wound therapy dme >50 sq cm (Left, 03/25/2017); pr insj/rplcmt perm dfb w/trnsvns lds 1/dual chmbr (N/A, 05/04/2017); pr prq trluml coronary stent w/angio one art/brnch (N/A, 03/12/2018); and pr cath place/coron angio, img super/interp,w left heart ventriculography (N/A, 05/12/2018).   Previous admit date: 06/02/2018    Primary Insurance- Payor: MEDICARE / Plan: MEDICARE PART A AND PART B / Product Type: *No Product type* /   Secondary Insurance ??? Secondary Insurance  MEDICAID LME CARDI*  Prescription Coverage ??? medicaid  Preferred Pharmacy - [facility] - [place], [place] - [address]  [facility]  [id number] - [place], [place] - [address]  [facility] [place] - [address]  [facility] [place] - [address]  [facility]  [facility]  [id number] - [place] ([place]), [place] - [address]    Transportation home: Private vehicle  Level of function prior to admission: Requires Assistance                   General  Care Manager assessed the patient by : Medical record review, Telephone conversation with family  Orientation Level: Oriented X4  Who provides care at home?: Family member  Reason for referral: Discharge Planning(ACO pt)    Contact/Decision Maker  Extended Emergency Contact Information  Primary Emergency Contact: Deyo,[name]  Address: [address] [place], [place] [address] [place]  Home Phone: [phone]  Relation: Spouse  Secondary Emergency Contact: Mannan,Bridgette   [place]  Home Phone: [phone]  Mobile Phone: [phone]  Relation: Daughter    Legal Next of Kin / Guardian / POA / Advance Directives     [name] [facility]): Krack,[name] - [phone]    [name] [facility]): Sledge,Bridgette - Daughter - [phone]    Advance Directive (Medical Treatment)  Reason patient does not have an advance directive covering medical treatment:: Patient does not wish to complete one at this time.  Healthcare Decision Maker: Patient does not wish to appoint a Health Care Decision Maker at this time    Health Care Decision Maker [[name]] (Medical & Mental Health Treatment)  Healthcare Decision Maker: Patient does not wish to appoint a Health Care Decision Maker at this time  Information offered on [name], Medical & Mental Health advance directives:: Patient declined information.  Advance Directive (Mental Health Treatment)  Does patient have an advance directive covering mental health treatment?: Patient does not have advance directive covering mental health treatment.  Reason patient does not have an advance directive covering mental health treatment:: Patient does not wish to complete one at this time.    Patient Information  Lives with: Spouse/significant other, Children, Family members((Pt and wife living with their daughter, daughter's fiance, and daughter's baby.))    Type of Residence: Private residence        Location/Detail: [address] [place] [address]     Support Systems: Strained, Spouse, Children    Responsibilities/Dependents at home?: No               Outpatient/Community Resources in place prior to admission: Clinic, DSS       Equipment Currently Used at Home: wheelchair, manual, walker, rolling, cane, straight, shower chair  Current HME Agency (Name/Phone #): CPAP from [facility] left at apt that they were evicted from or in storage from previous CM notes    Currently receiving outpatient dialysis?: No       Financial Information       Need for financial assistance?: Yes  Type of financial assistance required: [employer]    Social Determinants of Health  Social Determinants of Health were addressed in provider documentation.  Please refer to patient history.    Discharge Needs Assessment  Concerns to be Addressed: care coordination/care conferences, homelessness, coping/stress    Clinical Risk Factors: Functional Limitations, Poor Health Literacy, Multiple Diagnoses (Chronic), History of Falls    Barriers to taking medications: No    Prior overnight hospital stay or ED visit in last 90 days: Yes(4/28)    Readmission Within the Last 30 Days: no previous admission in last 30 days         Anticipated Changes Related to Illness: (TBD)    Equipment Needed After Discharge: none    Discharge Facility/Level of Care Needs: (home with self care will not accept [facility] about to be evicted end of this month)    Readmission  Risk of Unplanned Readmission Score:  %  Predictive Model Details   No score data available for [facility] Risk of Unplanned Readmission     Readmitted Within the Last 30 Days? (No if blank)   Patient at risk for readmission?: Yes    Discharge Plan  Screen findings are: Discharge planning needs identified or anticipated (Comment).(CM will follow for dc needs, if admitted SW consult needs to be placed.)    Expected Discharge Date: (TBD)    Expected Transfer from Critical Care: (n/a)    Patient and/or family were provided with choice of facilities / services that are available and appropriate to meet post hospital care needs?: N/A       Initial Assessment complete?: Yes  Adline Potter  September 14, 2018 6:53 PM  When patient/family asked, Were the services I provided/discussed with you today of value?, patient/family responded Y/N? Yes - but states, there is nothing really anyone can do for [place] all stimulus money is gone, unable to secure housing.

## 2018-09-15 NOTE — Unmapped (Signed)
INTERNAL MEDICINE COUNSELING SESSION NOTE    LENGTH OF SESSION:  Psychotherapy Session 45 minutes    TYPE OF PSYCHOTHERAPY: Behavioral, Supportive    REASON FOR TREATMENT: Problem Solving Treatment and Mind Body Stress Reduction skill training for??impact of health issues,??stress and self care management; link impact of effort to mood.        Patient participated in visit from his daughter's home  Patient indicated that:      they felt safe in their setting to engage in phone counseling visit.    I spent 45 minutes on the audio/video with the patient. I spent an additional 5 minutes on pre- and post-visit activities.     The patient was physically located in [place] or a state in which I am permitted to provide care. The patient and/or parent/guardian understood that s/he [place] incur co-pays and cost sharing, and agreed to the telemedicine visit. The visit was reasonable and appropriate under the circumstances given the patient's presentation at the time.    The patient and/or parent/guardian has been advised of the potential risks and limitations of this mode of treatment (including, but not limited to, the absence of in-person examination) and has agreed to be treated using telemedicine. The patient's/patient's family's questions regarding telemedicine have been answered.     If the phone/video visit was completed in an ambulatory setting, the patient and/or parent/guardian has also been advised to contact their provider???s office for worsening conditions, and seek emergency medical treatment and/or call 911 if the patient deems either necessary.             REVIEW OF FUNCTIONING SINCE LAST VISIT    MOOD (0-10) =  OK    5  of 10 with 10 being the best. At last visit was   4    SATISFACTION WITH EFFORT IN CARING FOR MOOD (0-10) =    8 of 10 with 10 being the best.  At last visit was   10    PAIN:     2   At last visit was    5    PHQ-9 SCORE:     6  At last visit was  2    GAD-7 SCORE:     8  At last visit was 2      CURRENT SUICIDAL/HOMICIDAL IDEATION:   Denies SI / HI      PROBLEM LIST:    Patient Active Problem List   Diagnosis   ??? CAD (coronary artery disease) (RAF-[name])   ??? Idiopathic chronic gout of left knee   ??? Hypercholesteremia   ??? Essential hypertension (RAF-[name])   ??? 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 Osteoarthritis   ??? Chronic nonalcoholic liver disease   ??? GERD (gastroesophageal reflux disease)   ??? Mild intermittent asthma without complication   ??? Chronic pain syndrome   ??? Paroxysmal atrial fibrillation (CMS-[name])   ??? OSA (obstructive sleep apnea)   ??? Tobacco use disorder   ??? Status post cardiac pacemaker procedure   ??? Diabetes mellitus with gastroparesis (CMS-[name])   ??? Cardiac pacemaker in situ   ??? Recurrent major depressive disorder, in full remission (CMS-[name])   ??? NSTEMI (non-ST elevated myocardial infarction) (CMS-[name])   ??? Adjustment disorder, unspecified   ??? Chronic heart failure with reduced ejection fraction and diastolic dysfunction (CMS-[name])   ??? Hyperkalemia   ??? CKD (chronic kidney disease) stage 4, GFR  15-29 ml/min (CMS-[name])   ??? Acquired left foot drop   ??? Decreased sensation of lower extremity, left   ??? Chronic anticoagulation   ??? Type 2 diabetes mellitus with stage 4 chronic kidney disease, with long-term current use of insulin (CMS-[name])   ??? Situational stress         MEDICATION COMPLIANCE:     Taking all medications regularly?  yes     Any changes in medication since last visit?  yes Increased novolog and started antinausea medicine    Any missed doses? no       Current Outpatient Medications on File Prior to Visit   Medication Sig Dispense Refill   ??? acetaminophen (TYLENOL) 500 MG tablet TAKE 2 TABLETS (1 000MG ) BY MOUTH EVERY 8 HOURS AS NEEDED FOR PAIN 180 each 3   ??? alirocumab (PRALUENT) 150 mg/mL subcutaneous injection Inject 1 mL (150 mg total) under the skin every fourteen (14) days. 4 Syringe 11   ??? allopurinoL (ZYLOPRIM) 100 MG tablet Take 0.5 tablets (50 mg total) by mouth daily. 45 tablet 3   ??? amiodarone (PACERONE) 200 MG tablet TAKE 1 TABLET BY MOUTH ONCE DAILY 90 tablet 3   ??? arm brace Misc 1 each by Miscellaneous route daily. 1 each 0   ??? atorvastatin (LIPITOR) 80 MG tablet Take 1 tablet (80 mg total) by mouth daily. 90 tablet 3   ??? blood sugar diagnostic Strp USE TO CHECK BLOOD SUGAR 4 TIMES DAILY (BEFORE MEALS AND NIGHTLY) 100 each 3   ??? blood-glucose meter kit Use to check blood sugars, #1 meter, #100 strips. Freestyle glucose meter. ICD-9 250.00 1 each 0   ??? bumetanide (BUMEX) 2 MG tablet Take 1 tablet (2 mg total) by mouth daily. 90 tablet 3   ??? carvediloL (COREG) 25 MG tablet TAKE 1 TABLET BY MOUTH WITH THE MORNING MEAL AND TAKE 2 TABLETS WITH THE EVENING MEAL 270 tablet 3   ??? cholecalciferol, vitamin D3, 5,000 unit capsule Take 1 capsule (5,000 Units total) by mouth daily. 100 capsule 0   ??? diclofenac sodium (VOLTAREN) 1 % gel Apply 2 grams topically Three (3) times a day. 100 g 11   ??? empty container Misc Use as directed to dispose of injectable medications 1 each 3   ??? ezetimibe (ZETIA) 10 mg tablet TAKE 1 TABLET BY MOUTH ONCE DAILY 90 each 3   ??? FLUoxetine (PROZAC) 40 MG capsule Take 2 capsules (80 mg total) by mouth daily. 180 capsule 3   ??? insulin ASPART (NOVOLOG FLEXPEN U-100 INSULIN) 100 unit/mL (3 mL) injection pen Inject 0.2 mL (20 Units total) under the skin three (3) times a day with a meal. 15 mL 11   ??? insulin detemir U-100 (LEVEMIR) 100 unit/mL (3 mL) injection pen Inject 0.4 mL (40 Units total) under the skin nightly. 15 mL 3   ??? insulin syringe-needle U-100 0.3 mL 31 gauge x 5/16 Syrg Use to inject insulin twice daily with meals 60 each 0   ??? nitroglycerin (NITROSTAT) 0.4 MG SL tablet Place 1 tablet (0.4 mg total) under the tongue every five (5) minutes as needed for chest pain. 100 each 3   ??? omeprazole (PRILOSEC) 20 MG capsule Take 1 capsule (20 mg total) by mouth daily. 90 capsule 3   ??? ondansetron (ZOFRAN) 4 MG tablet Take 1 tablet (4 mg total) by mouth every eight (8) hours as needed for nausea for up to 4 days. 10 tablet 0   ??? QUEtiapine (SEROQUEL)  50 MG tablet Take 1 tablet (50 mg total) by mouth two (2) times a day as needed (headache). 180 tablet 3   ??? rivaroxaban (XARELTO) 15 mg Tab Take 1 tablet (15 mg total) by mouth daily with evening meal. 90 tablet 3   ??? semaglutide 0.25 mg or 0.5 mg(2 mg/1.5 mL) PnIj Inject 0.5 mg under the skin every seven (7) days. 4.5 mL 3   ??? sevelamer (RENVELA) 800 mg tablet TAKE 2 TABLETS (1600MG ) BY MOUTH THREE TIMES DAILY WITH MEALS 540 tablet 3   ??? sodium polystyrene, SPS, with sorbitol (SPS, WITH SORBITOL,) 15-20 gram/60 mL Susp Take 60 mL (15 g) by mouth once a week. 473 mL 3   ??? ticagrelor (BRILINTA) 90 mg Tab TAKE 1 TABLET BY MOUTH TWICE DAILY 180 tablet 3   ??? UNIFINE PENTIPS 31 gauge x 5/16 Ndle USE WITH INSULIN AND VICTOZA 5 TIMES DAILY 400 each 3   ??? [DISCONTINUED] traZODone (DESYREL) 50 MG tablet Take 1 tablet (50 mg total) by mouth nightly. 90 tablet 3     No current facility-administered medications on file prior to visit.            SLEEPING:    about a 5  Maybe about 4 hours at night since February  During the day will sleep all day, some days I get my cat naps in    Will be living in their car starting next month          APPETITE:    alright somewhat less because of the nausea.  Testing being done on cause for nausea  BGs: running 125-160  Weight loss of about 15 lbs since February          SUBSTANCE USE:  no         SESSION FOCUS:  Addressed:  1. Adjustment disorder, unspecified type    2. Situational stress        Discussed efforts since last contact.  Taking medicines regularly with wife's help          Today's Problem Solving addressed:      Mr. Tremain and his wife were evicted in Feb due to financial difficulties heightened by ill health for both him and his wife. Wife is mostly ok; waiting on getting knee surgery. She's been out of work since January because of a fall.  Question of possible stomach cancer and surgery for himself.  Currently staying in daughter's home and she and her fiance and patient's grandson are to be evicted at the end June.    Explored and discussed impact and his plans  He has a [employer] G 4, front seats lay back. Plan to stay in driveway of family/friends'fire department or [employer] parking lot.  He is considering living in a tent in the woods again.  Nephew would take in their dog.  He thinks they could get a hotel 2-3x a week to shower and get a good night's sleep  Looking into possibility to rent a trailer, also on a waitlist with [employer] in [place] for Section 8. He says they were went from 615 down to 389, set back 2x was at 25, went back up, down to 2 and then back up to 100. When he and his wife called about this he was told they were put in for 0 bedrooms instead of 1 bedroom.  Verbal authorization to call [employer] to ask about waitlist and if I reach them pressing need to  get section 8 approval due to medical issues. He is aware I'll call to ask about the process and options to expedite it, will need written release for PHI.      Daughter and son in law want them to move down to [place] with them, but he doesn't want to be away from his medical team.  Brother with cancer stopped treatment and living 1 day at a time    Mr Bonadonna went to [facility] for an appt and passed out. He was taken to ED and 3 days in hospital, had IV and it caused three fingers went numb.  Tested 3x for COVID19. First time caused a nose bleed      I raised importance of sleep hygiene. Mr. Heskett expressed that he was not ready to work on sleep at this session.    Discussed issues presented, impact of stressors and options for self care through this tim.        Additional:         Medication adherence and barriers to the treatment plan have been addressed. Opportunities to optimize healthy behaviors have been discussed. Patient / caregiver voiced understanding.          ASSESSMENT      ENGAGEMENT: Patient presented a willingness to participate in treatment. Note Mr. Guettler was recently discharged from the hospital and was feeling physically depleted. He did the call while laying down. His wife was in the same room. Mr. Wilbon expressed comfort with her being present and felt he had privacy from his daughter and fiance .  PARTICIPATION QUALITY:    Active  MOOD:  OK     AFFECT:  REACTIVE   MENTAL STATUS:     alert and oriented  MODES OF INTERVENTION used in this session:    Clarification, Exploration, Problem Solving, Support or Week review      TREATMENT PLAN    Identified treatment goals:????improve self care management, develop / improve stress management skills or healthy sleep hygiene  ??  Patient-stated health goal:????support    Problem Solving Treatment and Mind Body Stress Reduction Skill Training:  Next visit will be   6 of up to 12 visits.    Wed 7/1 at [employer] [facility]

## 2018-09-15 NOTE — Unmapped (Signed)
General Consent to Treat (GCT) for non-epic video visits only: Verbal consent    [name] reviewed and updated in Epic:  Completed or Not completed (Reason complete)    Physical Address reviewed and complete    PHQ9  Thoughts that you would be better off dead, or of hurting yourself in some way: Not at all  PHQ-9 TOTAL SCORE: 6    GAD7  GAD-7 Total Score: 8

## 2018-09-15 NOTE — Unmapped (Signed)
[facility]   Emergency Department Provider Note    ED Final Clinical Impression     Final diagnoses:   Vomiting, intractability of vomiting not specified, presence of nausea not specified, unspecified vomiting type (Primary)         ED Course, Assessment and Plan     Initial Clinical Impression:    Jeremy Oliver is a 48 y.o. 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     Differential includes diabetic gastroparesis vs gastritis.  Pt passing flatus and having BM without issues, so obstruction less likely.  No focal tenderness on abdomen on PEx.  Plan to obtain baseline labs and to administer metoclopramide/Benadryl as well as 500 cc IVF bolus.        ED Course:    1840: CMP c mild hyperkalemia as well as elevation in Cr, baseline per review of prior labs.    1900: Transferred care of pt to attending pending IVF administration, re-evaluation, and final disposition.      I have discussed the case with the Attending Physician, Dr. Magdalene Molly who was in agreement with the plan as described above.  ____________________________________________    Time seen: September 14, 2018 6:07 PM     History     CHIEF COMPLAINT:   Chief Complaint   Patient presents with   ??? Emesis       HPI:   Jeremy Oliver is a 48 y.o. 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 Patient reports about 1 to 1.5 hours of emesis that began suddenly. He notes inability to tolerate po d/t nausea and vomiting. He states that he was eating a salad and has been resting most of today and otherwise felt fine today prior to symptom onset. He notes that his emesis tastes like stomach acid. He denies any chest pain, shortness of breath, or abdominal pain, or history of gastroparesis.    ____________________________________________    PAST MEDICAL HISTORY/PAST SURGICAL HISTORY:   Past Medical History:   Diagnosis Date   ??? Angina at rest (CMS-[name])    ??? Arthritis    ??? Asthma    ??? Atrial fibrillation and flutter (CMS-[name]) 06/08/14   ??? BMI 40.0-44.9, adult (CMS-[name]) 10/29/2015   ??? Chronic anticoagulation 02/17/2018   ??? Chronic kidney disease, stage 3 (CMS-[name])    ??? Chronic pain syndrome 04/11/2014    ~Use daily lyrica as recommended ~Tramadol as needed for pain ~Fluoxetine daily as recommended ~Daily exercise ~Eat a healthy diet ~Good control of your blood sugars can help    ??? Coronary artery disease    ??? Depression    ??? Diabetes mellitus (CMS-[name])    ??? Eye trauma 1998    glass in both eyes and removed   ??? GERD (gastroesophageal reflux disease)    ??? Gout    ??? Homeless    ??? Hyperlipidemia    ??? Hypertension    ??? Illiteracy and low-level literacy    ??? Microscopic hematuria    ??? Migraine    ??? Nephrolithiasis    ??? 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 Nonalcoholic fatty liver disease    ??? NSTEMI (non-ST elevated myocardial infarction) (CMS-[name]) 01/13/2017   ??? Obesity    ??? Obstructive sleep apnea 2009   ??? Panic attacks    ??? 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 Seizure-like activity (CMS-[name])     ~Monitor for symptoms and report them to your primary care provider if they occur    ??? Systolic CHF, chronic (CMS-[name])     EF 40-45% 2017   ??? TIA (transient ischemic attack)        MEDICATIONS:   No current facility-administered medications for this encounter.     Current Outpatient Medications:   ???  acetaminophen (TYLENOL) 500 MG tablet, TAKE 2 TABLETS (1 000MG ) BY MOUTH EVERY 8 HOURS AS NEEDED FOR PAIN, Disp: 180 each, Rfl: 3  ???  albuterol HFA 90 mcg/actuation inhaler, Inhale 2 puffs into the lungs every 6 (six) hours as needed for wheezing or shortness of breath., Disp: 18 g, Rfl: 2  ???  alirocumab (PRALUENT) 150 mg/mL subcutaneous injection, Inject 1 mL (150 mg total) under the skin every fourteen (14) days., Disp: 4 Syringe, Rfl: 11  ???  allopurinoL (ZYLOPRIM) 100 MG tablet, Take 0.5 tablets (50 mg total) by mouth daily., Disp: 45 tablet, Rfl: 3  ???  amiodarone (PACERONE) 200 MG tablet, TAKE 1 TABLET BY MOUTH ONCE DAILY, Disp: 90 tablet, Rfl: 3  ???  arm brace Misc, 1 each by Miscellaneous route daily., Disp: 1 each, Rfl: 0  ???  atorvastatin (LIPITOR) 80 MG tablet, Take 1 tablet (80 mg total) by mouth daily., Disp: 90 tablet, Rfl: 3  ???  blood sugar diagnostic Strp, USE TO CHECK BLOOD SUGAR 4 TIMES DAILY (BEFORE MEALS AND NIGHTLY), Disp: 100 each, Rfl: 3  ???  blood-glucose meter kit, Use to check blood sugars, #1 meter, #100 strips. Freestyle glucose meter. ICD-9 250.00, Disp: 1 each, Rfl: 0  ???  blood-glucose sensor (FREESTYLE NAVIGATOR GLUC SENS) Devi, Frequency:BID   Dosage:0.0     Instructions:  Note:Dose: 1, Disp: , Rfl:   ???  bumetanide (BUMEX) 2 MG tablet, Take 1 tablet (2 mg total) by mouth daily., Disp: 90 tablet, Rfl: 3  ???  carvediloL (COREG) 25 MG tablet, TAKE 1 TABLET BY MOUTH WITH THE MORNING MEAL AND TAKE 2 TABLETS WITH THE EVENING MEAL, Disp: 270 tablet, Rfl: 3  ???  cholecalciferol, vitamin D3, 5,000 unit capsule, Take 1 capsule (5,000 Units total) by mouth daily., Disp: 100 capsule, Rfl: 0  ???  diclofenac sodium (VOLTAREN) 1 % gel, Apply 2 grams topically Three (3) times a day., Disp: 100 g, Rfl: 11  ???  doxylamine (ULTRA SLEEP, DOXYLAMINE SUCC,) 25 mg tablet, Take 0.5 tablets (12.5 mg total) by mouth every eight (8) hours as needed for nausea for up to 5 days., Disp: 14 tablet, Rfl: 0  ???  empty container Misc, Use as directed to dispose of injectable medications, Disp: 1 each, Rfl: 3  ???  ezetimibe (ZETIA) 10 mg tablet, TAKE 1 TABLET BY MOUTH ONCE DAILY, Disp: 90 each, Rfl: 3  ???  FLUoxetine (PROZAC) 40 MG capsule, Take 2 capsules (80 mg total)  by mouth daily., Disp: 180 capsule, Rfl: 3  ???  gabapentin (NEURONTIN) 300 MG capsule, Take 1 capsule (300 mg total) by mouth two (2) times a day., Disp: 60 capsule, Rfl: 0  ???  insulin ASPART (NOVOLOG FLEXPEN U-100 INSULIN) 100 unit/mL (3 mL) injection pen, Inject 0.2 mL (20 Units total) under the skin three (3) times a day with a meal., Disp: 15 mL, Rfl: 11  ???  insulin detemir U-100 (LEVEMIR) 100 unit/mL (3 mL) injection pen, Inject 0.4 mL (40 Units total) under the skin nightly., Disp: 15 mL, Rfl: 3  ???  insulin syringe-needle U-100 0.3 mL 31 gauge x 5/16 Syrg, Use to inject insulin twice daily with meals, Disp: 60 each, Rfl: 0  ???  isosorbide mononitrate (IMDUR) 30 MG 24 hr tablet, Take 1 tablet (30 mg total) by mouth daily., Disp: 90 tablet, Rfl: 3  ???  melatonin 3 mg cap, Take 9 mg by mouth nightly., Disp: , Rfl:   ???  nitroglycerin (NITROSTAT) 0.4 MG SL tablet, Place 1 tablet (0.4 mg total) under the tongue every five (5) minutes as needed for chest pain., Disp: 100 each, Rfl: 3  ???  omeprazole (PRILOSEC) 20 MG capsule, Take 1 capsule (20 mg total) by mouth daily., Disp: 90 capsule, Rfl: 3  ???  ondansetron (ZOFRAN) 4 MG tablet, Take 1 tablet (4 mg total) by mouth every eight (8) hours as needed for nausea for up to 4 days., Disp: 10 tablet, Rfl: 0  ???  oxyCODONE-acetaminophen (PERCOCET) 5-325 mg per tablet, Take 1 tablet by mouth every eight (8) hours as needed. (Patient not taking: Reported on 08/12/2018), Disp: 12 tablet, Rfl: 0  ???  QUEtiapine (SEROQUEL) 50 MG tablet, Take 1 tablet (50 mg total) by mouth two (2) times a day as needed (headache)., Disp: 180 tablet, Rfl: 3  ??? rivaroxaban (XARELTO) 15 mg Tab, Take 1 tablet (15 mg total) by mouth daily with evening meal., Disp: 90 tablet, Rfl: 3  ???  semaglutide 0.25 mg or 0.5 mg(2 mg/1.5 mL) PnIj, Inject 0.5 mg under the skin every seven (7) days., Disp: 4.5 mL, Rfl: 3  ???  sevelamer (RENVELA) 800 mg tablet, TAKE 2 TABLETS (1600MG ) BY MOUTH THREE TIMES DAILY WITH MEALS, Disp: 540 tablet, Rfl: 3  ???  sodium polystyrene, SPS, with sorbitol (SPS, WITH SORBITOL,) 15-20 gram/60 mL Susp, Take 60 mL (15 g) by mouth once a week., Disp: 473 mL, Rfl: 3  ???  ticagrelor (BRILINTA) 90 mg Tab, TAKE 1 TABLET BY MOUTH TWICE DAILY, Disp: 180 tablet, Rfl: 3  ???  traMADol (ULTRAM) 50 mg tablet, Take 1 tablet (50 mg total) by mouth every six (6) hours as needed., Disp: 30 tablet, Rfl: 0  ???  UNIFINE PENTIPS 31 gauge x 5/16 Ndle, USE WITH INSULIN AND VICTOZA 5 TIMES DAILY, Disp: 400 each, Rfl: 3    ALLERGIES:   Losartan and Morphine    SOCIAL HISTORY:   Social History     Tobacco Use   ??? Smoking status: Never Smoker   ??? Smokeless tobacco: Current User     Types: Chew   ??? Tobacco comment: Pt dips 1 can a week, Pt not interested in quitting    Substance Use Topics   ??? Alcohol use: Never     Alcohol/week: 0.0 standard drinks     Frequency: Never     Comment: rare use: couple times a year, small amount       FAMILY HISTORY:  Family History  Problem Relation Age of Onset   ??? Diabetes Mother    ??? Hyperlipidemia Mother    ??? Heart disease Mother    ??? Basal cell carcinoma Mother    ??? Blindness Mother         diabeties   ??? Obesity Mother    ??? Hyperlipidemia Father    ??? Heart disease Father    ??? Lung disease Father    ??? Heart disease Half-Sister    ??? Kidney disease Half-Sister    ??? Gallbladder disease Half-Brother    ??? Obesity Half-Brother    ??? No Known Problems Daughter    ??? No Known Problems Son    ??? Obesity Daughter    ??? Melanoma Neg Hx    ??? Squamous cell carcinoma Neg Hx    ??? Macular degeneration Neg Hx    ??? Glaucoma Neg Hx ____________________________________________    Review of Systems  Constitutional: Negative for fever.  HEENT: No HA, sore throat, or vision changes.  Cardiovascular: Negative for chest pain.  Respiratory: Negative for shortness of breath.  GI: Positive for vomiting and nausea. No abd pain, diarrhea, or constipation.  GU: No hematuria, increased urgency or frequency.  Neuro: No numbness or tingling  Psych: No SI or HI.    A 10 point review of systems was performed and is negative other than positive elements noted in HPI     Physical Exam     VITAL SIGNS:    BP 191/96  - Pulse 105  - Temp 36.8 ??C (98.2 ??F) (Oral)  - Resp 19  - SpO2 98%     Constitutional: Alert and oriented. Nontoxic appearing and in no distress.  Head: Normocephalic and atraumatic  Eyes: EOMI, PERRL, conjunctiva normal  ENT: External ears normal, bilateral nares clear, MMM  Cardiovascular: Normal rate, regular rhythm. Normal and symmetric distal pulses are present in all extremities.  Respiratory: Normal respiratory effort. Breath sounds are normal.  Gastrointestinal: Soft, non-distended and nontender. There is no CVA tenderness.  Musculoskeletal: Nontender with normal range of motion in all extremities.       Right lower leg: No tenderness or edema.       Left lower leg: No tenderness or edema.  Neurologic: Normal speech and language. No gross focal neurologic deficits are appreciated.  Skin: Skin is warm, dry and intact. No rash noted.  Psychiatric: Mood and affect are normal. Speech and behavior are normal.        Radiology     No orders to display         Pertinent labs & imaging results that were available during my care of the patient were reviewed by me and considered in my medical decision making. Labs and radiology studies included in my note [place] not constitute all ordered/reviewied labs during this encounter (see chart for details).    Portions of this record have been created using Scientist, clinical (histocompatibility and immunogenetics). Dictation errors have been sought, but [place] not have been identified and corrected.    Documentation assistance was provided by Woody Seller, Scribe, on September 14, 2018 at 6:07 PM for Donavan Burnet, MD.      Documentation assistance was provided by the scribe in my presence.  The documentation recorded by the scribe has been reviewed by me and accurately reflects the services I personally performed.       Dossie Der, MD  Resident  09/17/18 [phone]

## 2018-09-17 ENCOUNTER — Telehealth: Admit: 2018-09-17 | Discharge: 2018-09-18 | Payer: MEDICARE

## 2018-09-17 DIAGNOSIS — E875 Hyperkalemia: Secondary | ICD-10-CM

## 2018-09-17 DIAGNOSIS — N184 Chronic kidney disease, stage 4 (severe): Secondary | ICD-10-CM

## 2018-09-17 DIAGNOSIS — R1111 Vomiting without nausea: Principal | ICD-10-CM

## 2018-09-17 NOTE — Unmapped (Signed)
This patient has been reviewed for Intensive Case Management services and does not meet eligibility criteria at this time.      To have this patient reevaluated for Intensive Case Management please place AMB Referral to Case Management to the Personal Health Advocate Department.

## 2018-09-17 NOTE — Unmapped (Signed)
..  Previsit complete    Reason for visit: Follow up    General Consent to Treat (GCT) for non-epic video visits only: Verbal consent        Diabetes:  ? Regularly checking blood sugars?: yes  o If yes, when? Complete log  Date Before Breakfast After Breakfast Before Lunch After Lunch Before Dinner After Dinner Before Bed    09/17/18  150                                                                                                                                   Allergies reviewed: No    Medication reviewed: Yes    Refills needed, if any: None    Travel Screening: completed    Falls Risk: not completed    Hark (DV) Screening: completed    [name] reviewed and updated in Epic:  Completed       HARK Screening  HARK Screening  Within the last year, have you been humiliated or emotionally abused in other ways by your partner or ex-partner?: No  Within the last year, have you been afraid of your partner or ex-partner?: No  Within the last year, have you been raped or forced to have any kind of sexual activity by your partner or ex-partner?: No  Within the last year, have you been kicked, hit, slapped, or otherwise physically hurt by your partner or ex-partner?: No

## 2018-09-17 NOTE — Unmapped (Signed)
Internal Medicine Phone Visit    This visit is conducted via telephone.    Contact Information  Person Contacted: Patient  Contact Phone number: [phone] (home)   Is there someone else in the room? No.   Patient agreed to a phone visit    [name] is a 48 y.o. male  participating in a telephone encounter.    Reason for Call  ED f/u    Subjective:  Went to ED because was throwing up really bad, brown colored stomach acid.  Prescribed Zofran.   Reports that most of the time he feels sick, it's when he's wearing a surgical mask and makes it feel hard to breathe. However issues with vomiting also occurs. Has not correlated with BGs now that they are better controlled, he is still having vomiting issues.     Increased Prozac since last visit.   Issues with housing.  A home right now and getting ready to live in car again. Reports that he gets pushed further down the wait list for housing in [place].  Doesn't want to live in shelter if separate from wife.    I have reviewed the problem list, medications, and allergies and have updated/reconciled them if needed.    Objective:  As part of this Telephone Visit, no in-person exam was conducted.      Assessment & Plan:  1. Intractable vomiting without nausea, unspecified vomiting type    2. Hyperkalemia    3. CKD (chronic kidney disease) stage 4, GFR 15-29 ml/min (CMS-[name])        1. Intractable vomiting without nausea, unspecified vomiting type  Has had frequent ED visits due to this and intermittently reports regurgitation of undigested foods. Gastric emptying study was normal.    - Gastroenterology; Future    2. Hyperkalemia  At recent ED visit, K up to 4.8. Will continue on Kayexylate.  Has upcoming nephrology appt for ongoing management  - Renal Function Panel; Future - to be obtained prior to nephrology appt to have labs available to discuss with Nephrology    3. CKD (chronic kidney disease) stage 4, GFR 15-29 ml/min (CMS-[name])  Most complicating issue has been hyperkalemia. Has not had issues related to volume status.  Uremia has remained stable and not sure that this is contributing to his nausea and vomiting.  Has appt with Nephrology to discuss more  - Renal Function Panel; Future      Return in about 3 weeks (around 10/08/2018).       I spent 21 minutes on the phone with the patient. I spent an additional 3 minutes on pre- and post-visit activities.     The patient was physically located in [place] or a state in which I am permitted to provide care. The patient and/or parent/guardian understood that s/he [place] incur co-pays and cost sharing, and agreed to the telemedicine visit. The visit was reasonable and appropriate under the circumstances given the patient's presentation at the time.    The patient and/or parent/guardian has been advised of the potential risks and limitations of this mode of treatment (including, but not limited to, the absence of in-person examination) and has agreed to be treated using telemedicine. The patient's/patient's family's questions regarding telemedicine have been answered.     If the phone/video visit was completed in an ambulatory setting, the patient and/or parent/guardian has also been advised to contact their provider???s office for worsening conditions, and seek emergency medical treatment and/or call 911 if the patient deems either  necessary.

## 2018-09-20 DIAGNOSIS — R0789 Other chest pain: Principal | ICD-10-CM

## 2018-09-20 LAB — RENAL FUNCTION PANEL
ALBUMIN: 3.6 g/dL (ref 3.5–5.0)
ANION GAP: 13 mmol/L (ref 7–15)
BLOOD UREA NITROGEN: 27 mg/dL — ABNORMAL HIGH (ref 7–21)
BUN / CREAT RATIO: 8
CALCIUM: 9.1 mg/dL (ref 8.5–10.2)
CHLORIDE: 107 mmol/L (ref 98–107)
CO2: 20 mmol/L — ABNORMAL LOW (ref 22.0–30.0)
EGFR CKD-EPI NON-AA MALE: 20 mL/min/{1.73_m2} — ABNORMAL LOW (ref >=60)
GLUCOSE RANDOM: 151 mg/dL (ref 70–179)
PHOSPHORUS: 3.9 mg/dL (ref 2.9–4.7)
POTASSIUM: 5.1 mmol/L — ABNORMAL HIGH (ref 3.5–5.0)
SODIUM: 140 mmol/L (ref 135–145)

## 2018-09-20 LAB — URIC ACID: Urate:MCnc:Pt:Ser/Plas:Qn:: 8.1

## 2018-09-20 LAB — BLOOD UREA NITROGEN: Urea nitrogen:MCnc:Pt:Ser/Plas:Qn:: 27 — ABNORMAL HIGH

## 2018-09-20 NOTE — Unmapped (Signed)
Labs drawn

## 2018-09-21 ENCOUNTER — Ambulatory Visit
Admit: 2018-09-21 | Discharge: 2018-09-22 | Disposition: A | Discharge status: Home / Self Care | Payer: MEDICARE | Admitting: Cardiovascular Disease

## 2018-09-21 ENCOUNTER — Institutional Professional Consult (permissible substitution)
Admit: 2018-09-21 | Discharge: 2018-09-22 | Disposition: A | Discharge status: Home / Self Care | Payer: MEDICARE | Admitting: Cardiovascular Disease

## 2018-09-21 DIAGNOSIS — R0789 Other chest pain: Principal | ICD-10-CM

## 2018-09-21 LAB — CBC W/ AUTO DIFF
BASOPHILS ABSOLUTE COUNT: 0.1 10*9/L (ref 0.0–0.1)
BASOPHILS RELATIVE PERCENT: 0.8 %
EOSINOPHILS ABSOLUTE COUNT: 0.3 10*9/L (ref 0.0–0.4)
EOSINOPHILS RELATIVE PERCENT: 2.8 %
HEMATOCRIT: 31.8 % — ABNORMAL LOW (ref 41.0–53.0)
LARGE UNSTAINED CELLS: 2 % (ref 0–4)
LYMPHOCYTES ABSOLUTE COUNT: 2.2 10*9/L (ref 1.5–5.0)
LYMPHOCYTES RELATIVE PERCENT: 21.4 %
MEAN CORPUSCULAR HEMOGLOBIN CONC: 31.6 g/dL (ref 31.0–37.0)
MEAN CORPUSCULAR HEMOGLOBIN: 25.5 pg — ABNORMAL LOW (ref 26.0–34.0)
MEAN CORPUSCULAR VOLUME: 80.7 fL (ref 80.0–100.0)
MONOCYTES ABSOLUTE COUNT: 0.5 10*9/L (ref 0.2–0.8)
MONOCYTES RELATIVE PERCENT: 4.5 %
NEUTROPHILS ABSOLUTE COUNT: 6.9 10*9/L (ref 2.0–7.5)
NEUTROPHILS RELATIVE PERCENT: 68.4 %
PLATELET COUNT: 264 10*9/L (ref 150–440)
RED BLOOD CELL COUNT: 3.94 10*12/L — ABNORMAL LOW (ref 4.50–5.90)
RED CELL DISTRIBUTION WIDTH: 17.8 % — ABNORMAL HIGH (ref 12.0–15.0)
WBC ADJUSTED: 10.1 10*9/L (ref 4.5–11.0)

## 2018-09-21 LAB — BASIC METABOLIC PANEL
ANION GAP: 10 mmol/L (ref 7–15)
BLOOD UREA NITROGEN: 33 mg/dL — ABNORMAL HIGH (ref 7–21)
BUN / CREAT RATIO: 9
CALCIUM: 8.9 mg/dL (ref 8.5–10.2)
CHLORIDE: 108 mmol/L — ABNORMAL HIGH (ref 98–107)
CO2: 22 mmol/L (ref 22.0–30.0)
EGFR CKD-EPI AA MALE: 22 mL/min/{1.73_m2} — ABNORMAL LOW (ref >=60)
EGFR CKD-EPI NON-AA MALE: 19 mL/min/{1.73_m2} — ABNORMAL LOW (ref >=60)
GLUCOSE RANDOM: 293 mg/dL — ABNORMAL HIGH (ref 70–179)
POTASSIUM: 4.8 mmol/L (ref 3.5–5.0)
SODIUM: 140 mmol/L (ref 135–145)

## 2018-09-21 LAB — PRO-BNP
Natriuretic peptide.B prohormone N-Terminal:MCnc:Pt:Ser/Plas:Qn:: 3440 — ABNORMAL HIGH
PRO-BNP: 3440 pg/mL — ABNORMAL HIGH (ref 0.0–138.0)

## 2018-09-21 LAB — TROPONIN I
Troponin I.cardiac:MCnc:Pt:Ser/Plas:Qn:: 0.15
Troponin I.cardiac:MCnc:Pt:Ser/Plas:Qn:: 0.21
Troponin I.cardiac:MCnc:Pt:Ser/Plas:Qn:: 0.447
Troponin I.cardiac:MCnc:Pt:Ser/Plas:Qn:: 0.536
Troponin I.cardiac:MCnc:Pt:Ser/Plas:Qn:: 0.564

## 2018-09-21 LAB — BUN / CREAT RATIO: Urea nitrogen/Creatinine:MRto:Pt:Ser/Plas:Qn:: 9

## 2018-09-21 LAB — APTT: Coagulation surface induced:Time:Pt:PPP:Qn:Coag: 31.6

## 2018-09-21 LAB — MEAN CORPUSCULAR VOLUME: Lab: 80.7

## 2018-09-21 NOTE — Unmapped (Signed)
[facility] Cardiology Transfer Center Request Note    3:17 AM  09/21/18    Covid-19 screen:    Traveled internationally within the last 14 days to countries with sustained community transmission? No  Have signs or symptoms of a respiratory infection, such as a fever, cough or sore throat? No  Have had contact in the last 14 days with someone who has, or is under investigation for having, COVID19 or who has a respiratory illness? No  Reside in a community where community-based spread of COVID-19 is occurring? No    Requesting Physician: Dr. Norlene Campbell  Requesting [facility]: [facility]  Requesting Service: ED    Reason for Transfer: NSTEMI    Brief Hospital Course:  Jeremy Oliver is a 48 y.o.M with history of CAD s/p multiple PCI's (most recently LAD 05/2018), ICM s/p ICD, CKD, paroxysmal afib, HTN, HLD, and T2DM who presented to the ED with several days of stuttering chest pain that significantly worsened this evening described as similar to prior heart attacks. ECG reportedly without significant new ischemic changes compared to prior. Troponin elevated at 0.150. Patient takes Xarelto and Ticagrelor at home. He is being loaded with 324mg  ASA, given SL nitro, and will be transferred to [facility] cardiology for further management after COVID testing.    Plan Upon Arrival: Evaluate and treat for possible ACS    Accepting Service: Med C  Bed Type: Floor  Attending: Dr. Colette Ribas, MD  [facility] Cardiovascular Medicine Fellow

## 2018-09-21 NOTE — Unmapped (Signed)
Wife states pt went to ER late last night re: CP (wife states transient c/o CP heart burn for last few days)  HBO ED:  Elevated troponin level, NSTEMI, being transferred to [facility] asap.  Asks I notify PCP and cardiology.  Pt currently stable however per wife, pt is crying with fear regarding situation and anticipation of treatment plan.  Wife states pt has been taking all prescribed medications.  Will notify providers.

## 2018-09-21 NOTE — Unmapped (Signed)
Cardiology H&P    Assessment/Plan:    Active Problems:    CAD (coronary artery disease) (RAF-[name])    Idiopathic chronic gout of left knee    Hypercholesteremia    Essential hypertension (RAF-[name])    Obesity, Class III, BMI 40-49.9 (morbid obesity) (CMS-[name])    GERD (gastroesophageal reflux disease)    Paroxysmal atrial fibrillation (CMS-[name])    OSA (obstructive sleep apnea)    Tobacco use disorder    Diabetes mellitus with gastroparesis (CMS-[name])    Chronic heart failure with reduced ejection fraction and diastolic dysfunction (CMS-[name])    CKD (chronic kidney disease) stage 4, GFR 15-29 ml/min (CMS-[name])      Jeremy Oliver is a 48  y.o. 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 He has a complex cardiac history salient for many PCIs. He is currently chest pain free. We will opt for medical management and cycle troponins, keep on a heparin drip, and monitor with serial EKGs.    Concern for VF - resolved  History of pseudoseizures  EMS concerned in transport that patient had an episode of VF. Presumed event in ambulance was a pseudoseizure since device was interrogated by EP nurse and cardiology fellow: no discharges or dysrhythmias    Chest pain - CAD s/p multiple PCIs: History of extensive CAD with multiple stents. Had an STEMI in 2018 with VT arrest (s/p ICD) with DES to mLAD. Presented with NSTEMI on 03/12/2018 and had 2x DES placed in the mid RCA and RPLA/RPDA bifurcation. Another NSTEMI in February 2020 w/ PCI to proximal/mid LAD. Patient presented again in 06/2018 with chest pain associated with persistent abdominal pain and was found to have NSTEMI (Type 1 vs Type 2). LDL elevated in 5/20 at 105. Hemoglobin A1c 9.5 in 06/2018. Hoe now presents with severe crushing substernal chest pain despite compliance with home rivaroxaban and ticagrelor. EKG w/ TWI in lateral leads unchanged from prior. Found to have elevated troponin in the ED at 0.150. Will opt for medical management; will defer on LHC given overall stability.  -- Monitor on telemetry and with serial EKGs  -- Cycle cardiac biomarkers   -- s/p ASA 325 on arrival  -- Atorvastatin 80mg  PO daily  -- Ticagrelor 90mg  BID   -- Start heparin gtt in place of home rivaroxaban, arrhythmia nomogram     HFrEF: 2/2 ischemic cardiomyopathy.TTE 05/2018 showed EF of 40-45% with segmental wall motion abnormalities in the distribution of the anterior descending artery, unchanged from the previous study of 2/13/202.  -- Home carvedilol   -- Bumex 2mg  daily     Paroxysmal Afib: NSR on EKG in ED.   -- Anticoagulation with heparin for home rivaroxaban   -- Rate control with carvedilol   -- Amiodarone 200mg  daily     T2DM: Poorly controlled with most recent A1c 9.5. Insulin dependent at home.   -- Lantus 40 QHS for levemir 40   -- SSI   -- ACHS Glucose checks     CKD Stage IV: Highly variable baseline but more recently appears to be in the 3.5-4.0 range. 3.58 on admission today.   -- Avoid nephrotoxins   -- Trend creatinine   -- Monitor I's and O's   -- Home sevelamer TID w/ meals     HLD: As above. On Praluent injections at home.   Gout: allopurinol 50mg  daily   GERD: pantoprazole  for home omeprazole     Diet: Heart Healthy   DVT Ppx: hold home anticoagulation while on heparin drip  GI Ppx: Home PPI     Code Status: Full, confirmed on admission  His wife helps him with his healthcare decisionmaking and he designates her as his surrogate healthcare decisionmaker if needed.    Dispo: Admit to [facility] 2, floor status    ___________________________________________________________________    HPI:  Jeremy Oliver is a 48 y.o. male with a complex past medical history who presents with chest pain.    Developed chest pain at rest. He had stabbing chest pain that came on at rest. He says the pain was mostly over his left chest with some radiation to his hands. He denies any associated diaphoresis. He denies any shortness of breath. He denies fevers, nausea, vomiting, diarrhea.     He did not have any nitroglycerin and did not take any to see if it improved his pain.    Does report some chronic leg swelling with his dropfoot.    Chest pain free during interview. He is feeling better now. He reports having a mild headache and being hungry. no other symptoms.    He reports that his wife manages all of his medications and helps him with his healthcare decisionmaking.    Review of Systems:  10 systems reviewed and are negative unless otherwise mentioned in HPI    Cardiovascular History & Procedures:    LHC  05/12/18  1. Coronary artery disease including diffuse moderate to severe lesions in the left coronary system, with particular emphasis on the 90% proximal to mid LAD in-stent restenosis.  2. Successful PCI to the proximal to mid LAD with the placement of a Synergy drug eluting stent with excellent angiographic result and TIMI 3 flow.  3. Subsequent impaired flow at an adjacent moderate diagonal, which was small and had pre-existing ostial disease. Recrossed and treated with balloon angioplasty with restored flow.  4. Recent stent to RPDA/RPLA bifurcation widely patent.  ??  CV Surgery:  ??  None  ??  EP Procedures and Devices:  ?? Dual chamber pacemaker upgraded to dual chamber ICD 05/2017  ??  Non-Invasive Evaluation(s):  ?? Echo 06/02/2018  ?? Mildly decreased global left ventricular systolic function (EF 40-45%), with segmental wall motion abnormalities in the distribution of the anterior descending artery, unchanged from the previous study of 05/13/2018.  ?? Normal right ventricular systolic function.  ?? No gross structural valvular abnormalities.    Medical History:  Past Medical History:   Diagnosis Date   ??? Angina at rest (CMS-[name])    ??? Arthritis    ??? Asthma    ??? Atrial fibrillation and flutter (CMS-[name]) 06/08/14   ??? BMI 40.0-44.9, adult (CMS-[name]) 10/29/2015   ??? Chronic anticoagulation 02/17/2018   ??? Chronic kidney disease, stage 3 (CMS-[name])    ??? Chronic pain syndrome 04/11/2014    ~Use daily lyrica as recommended ~Tramadol as needed for pain ~Fluoxetine daily as recommended ~Daily exercise ~Eat a healthy diet ~Good control of your blood sugars can help    ??? Coronary artery disease    ??? Depression    ??? Diabetes mellitus (CMS-[name])    ??? Eye trauma 1998    glass in both eyes and removed   ??? GERD (gastroesophageal reflux disease)    ??? Gout    ??? Homeless    ??? Hyperlipidemia    ??? Hypertension    ??? Illiteracy and low-level literacy    ???  Microscopic hematuria    ??? Migraine    ??? Nephrolithiasis    ??? 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 Nonalcoholic fatty liver disease    ??? NSTEMI (non-ST elevated myocardial infarction) (CMS-[name]) 01/13/2017   ??? Obesity    ??? Obstructive sleep apnea 2009   ??? Panic attacks    ??? 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 Seizure-like activity (CMS-[name])     ~Monitor for symptoms and report them to your primary care provider if they occur    ??? Systolic CHF, chronic (CMS-[name])     EF 40-45% 2017   ??? TIA (transient ischemic attack)        Surgical History:  Past Surgical History:   Procedure Laterality Date   ??? CARDIAC CATHETERIZATION  03/08/2007   ??? CYSTOURETHROSCOPY  12/31/2009     Microscopic hematuria   ??? KNEE SURGERY Right 09/23/2006    arthroscopic knee surgery medial and lateral meniscus tears as well as crystal disease   ??? PR CATH PLACE/CORON ANGIO, IMG SUPER/INTERP,R&L HRT CATH, L HRT VENTRIC N/A 02/13/2017    Procedure: Left/Right Heart Catheterization;  Surgeon: Marlaine Hind, MD;  Location: [facility] CATH;  Service: Cardiology   ??? PR CATH PLACE/CORON ANGIO, IMG SUPER/INTERP,W LEFT HEART VENTRICULOGRAPHY N/A 03/17/2017    Procedure: Left Heart Catheterization W Intervention;  Surgeon: Marlaine Hind, MD;  Location: [facility] CATH;  Service: Cardiology   ??? PR CATH PLACE/CORON ANGIO, IMG SUPER/INTERP,W LEFT HEART VENTRICULOGRAPHY N/A 05/12/2018    Procedure: CATH LEFT HEART CATHETERIZATION W INTERVENTION;  Surgeon: Dorathy Kinsman, MD;  Location: [facility] CATH;  Service: Cardiology   ??? PR ECMO/ECLS INITIATION VENO-ARTERIAL N/A 03/19/2017    Procedure: ECMO/ECLS; INITIATION, VENO-ARTERIAL;  Surgeon: Lennie Odor, MD;  Location: MAIN OR [facility];  Service: Cardiac Surgery   ??? PR ECMO/ECLS RMVL PRPH CANNULA OPEN 6 YRS & OLDER Left 03/25/2017    Procedure: ECMO / ECLS PROVIDED BY PHYSICIAN; REMOVAL OF PERIPHERAL (ARTERIAL AND/OR VENOUS) CANNULA(E), OPEN, 6 YEARS AND OLDER;  Surgeon: Alonna Buckler Ikonomidis, MD;  Location: MAIN OR [facility];  Service: Cardiac Surgery   ??? PR EPHYS EVAL W/ ABLATION SUPRAVENT ARRHYTHMIA N/A 07/19/2015    Catheter ablation of cavotricuspid isthmus for atrial flutter [facility])   ??? PR INSER HART PACER XVENOUS ATRIAL N/A 05/30/2015    [employer] dual-chamber pacemaker implant (E.Chung)   ??? PR INSERT/PLACE FLOW DIRECT CATH N/A 03/17/2017    Procedure: Insert Leave In [place];  Surgeon: Marlaine Hind, MD;  Location: [facility] CATH;  Service: Cardiology   ??? PR INSJ/RPLCMT PERM DFB W/TRNSVNS LDS 1/DUAL CHMBR N/A 05/04/2017    Procedure: ICD Implant System (Single/Dual);  Surgeon: Eldred Manges, MD;  Location: [facility] CATH;  Service: Cardiology   ??? PR NEGATIVE PRESSURE WOUND THERAPY DME >50 SQ CM Left 03/25/2017    Procedure: Neg Press Wound Tx (Vac Assist) Incl Topicals, Per Session, Tsa Greater Than/= 50 Cm Squared;  Surgeon: Alonna Buckler Ikonomidis, MD;  Location: MAIN OR [facility];  Service: Cardiac Surgery   ??? PR PRQ TRLUML CORONARY STENT W/ANGIO ONE ART/BRNCH  N/A 03/12/2018    Procedure: Percutaneous Coronary Intervention;  Surgeon: Marlaine Hind, MD;  Location: [facility] CATH;  Service: Cardiology   ??? PR REBL VES DIRECT,LOW EXTREM Left 03/19/2017    Procedure: Repr Bld Vessel Direct; Lower Extrem;  Surgeon: Boykin Reaper, MD;  Location: MAIN OR [facility];  Service: Vascular   ??? PR REMV ART CLOT ILIAC-POP,LEG INCIS Left 03/25/2017    Procedure: EMBOLECTOMY OR THROMBECTOMY, WITH OR WITHOUT CATHETER; FEMOROPOPLITEAL, AORTOILIAC ARTERY, BY LEG INCISION;  Surgeon: Alonna Buckler Ikonomidis, MD;  Location: MAIN OR [facility];  Service: Cardiac Surgery   ??? PR UPPER GI ENDOSCOPY,BIOPSY N/A 02/28/2016    Procedure: UGI ENDOSCOPY; WITH BIOPSY, SINGLE OR MULTIPLE;  Surgeon: Liane Comber, MD;  Location: GI [facility];  Service: Gastroenterology   ??? PR UPPER GI ENDOSCOPY,DIAGNOSIS N/A 05/07/2016    Procedure: UGI ENDO, INCLUDE ESOPHAGUS, STOMACH, & DUODENUM &/OR JEJUNUM; DX W/WO COLLECTION SPECIMN, BY BRUSH OR WASH;  Surgeon: Modena Nunnery, MD;  Location: GI [facility];  Service: Gastroenterology       Allergies:  Losartan and Morphine    Medications:   Prior to Admission medications    Medication Sig Start Date End Date Taking? Authorizing Provider   acetaminophen (TYLENOL) 500 MG tablet TAKE 2 TABLETS (1 000MG ) BY MOUTH EVERY 8 HOURS AS NEEDED FOR PAIN 06/22/18 06/22/19  Julieanne Cotton, PA   albuterol HFA 90 mcg/actuation inhaler Inhale 2 puffs into the lungs every 6 (six) hours as needed for wheezing or shortness of breath. 07/08/18   Roma Schanz, MD   alirocumab (PRALUENT) 150 mg/mL subcutaneous injection Inject 1 mL (150 mg total) under the skin every fourteen (14) days. 06/24/18   Lyda Kalata Max, CPP   allopurinoL (ZYLOPRIM) 100 MG tablet Take 0.5 tablets (50 mg total) by mouth daily. 07/15/18 07/15/19  Roma Schanz, MD   amiodarone (PACERONE) 200 MG tablet TAKE 1 TABLET BY MOUTH ONCE DAILY 06/10/18 06/10/19  Roma Schanz, MD   arm brace Misc 1 each by Miscellaneous route daily. 06/22/18   Napoleon Form, MD   atorvastatin (LIPITOR) 80 MG tablet Take 1 tablet (80 mg total) by mouth daily. 03/18/18 03/18/19  Julieanne Cotton, PA   blood sugar diagnostic Strp USE TO CHECK BLOOD SUGAR 4 TIMES DAILY (BEFORE MEALS AND NIGHTLY) 06/04/18 06/04/19  Lowry Bowl, MD   blood-glucose meter kit Use to check blood sugars, #1 meter, #100 strips. Freestyle glucose meter. ICD-9 250.00 06/27/16   Julieanne Cotton, PA   bumetanide (BUMEX) 2 MG tablet Take 1 tablet (2 mg total) by mouth daily. 08/11/18 08/11/19  Roma Schanz, MD   carvediloL (COREG) 25 MG tablet TAKE 1 TABLET BY MOUTH WITH THE MORNING MEAL AND TAKE 2 TABLETS WITH THE EVENING MEAL 03/18/18 03/18/19  Julieanne Cotton, PA   cholecalciferol, vitamin D3, 5,000 unit capsule Take 1 capsule (5,000 Units total) by mouth daily. 07/08/18 07/08/19  Roma Schanz, MD   diclofenac sodium (VOLTAREN) 1 % gel Apply 2 grams topically Three (3) times a day. 08/12/18 08/12/19  Shon Baton, MD   doxylamine (ULTRA SLEEP, DOXYLAMINE SUCC,) 25 mg tablet Take 0.5 tablets (12.5 mg total) by mouth every eight (8) hours as needed for nausea for up to 5 days. 09/14/18 09/19/18  Laurance Flatten, MD   empty container Misc Use as directed to dispose of injectable medications 05/03/18   Lyda Kalata Max, CPP   ezetimibe (ZETIA) 10 mg  tablet TAKE 1 TABLET BY MOUTH ONCE DAILY 03/18/18 03/18/19  Julieanne Cotton, PA   FLUoxetine (PROZAC) 40 MG capsule Take 2 capsules (80 mg total) by mouth daily. 09/03/18 09/03/19  Roma Schanz, MD   gabapentin (NEURONTIN) 300 MG capsule Take 1 capsule (300 mg total) by mouth two (2) times a day. 08/25/18 09/30/18  Roma Schanz, MD   insulin ASPART (NOVOLOG FLEXPEN U-100 INSULIN) 100 unit/mL (3 mL) injection pen Inject 0.2 mL (20 Units total) under the skin three (3) times a day with a meal. 09/10/18   Roma Schanz, MD   insulin detemir U-100 (LEVEMIR) 100 unit/mL (3 mL) injection pen Inject 0.4 mL (40 Units total) under the skin nightly. 07/05/18   Roma Schanz, MD   insulin syringe-needle U-100 0.3 mL 31 gauge x 5/16 Syrg Use to inject insulin twice daily with meals 05/23/18   Rosette Reveal, MD   isosorbide mononitrate (IMDUR) 30 MG 24 hr tablet Take 1 tablet (30 mg total) by mouth daily. 03/18/18   Julieanne Cotton, PA   melatonin 3 mg cap Take 9 mg by mouth nightly.    Historical Provider, MD   nitroglycerin (NITROSTAT) 0.4 MG SL tablet Place 1 tablet (0.4 mg total) under the tongue every five (5) minutes as needed for chest pain. 03/18/18 03/18/19  Julieanne Cotton, PA   omeprazole (PRILOSEC) 20 MG capsule Take 1 capsule (20 mg total) by mouth daily. 05/28/18 05/28/19  Lolita Lenz, MD   ondansetron (ZOFRAN) 4 MG tablet Take 1 tablet (4 mg total) by mouth every eight (8) hours as needed for nausea for up to 4 days. 09/14/18 09/18/18  Laurance Flatten, MD   oxyCODONE-acetaminophen (PERCOCET) 5-325 mg per tablet Take 1 tablet by mouth every eight (8) hours as needed.  Patient not taking: Reported on 08/12/2018 06/09/18      QUEtiapine (SEROQUEL) 50 MG tablet Take 1 tablet (50 mg total) by mouth two (2) times a day as needed (headache). 03/18/18   Julieanne Cotton, PA   rivaroxaban (XARELTO) 15 mg Tab Take 1 tablet (15 mg total) by mouth daily with evening meal. 03/18/18 03/18/19  Julieanne Cotton, PA   semaglutide 0.25 mg or 0.5 mg(2 mg/1.5 mL) PnIj Inject 0.5 mg under the skin every seven (7) days. 07/27/18 07/27/19  Roma Schanz, MD   sevelamer (RENVELA) 800 mg tablet TAKE 2 TABLETS (1600MG ) BY MOUTH THREE TIMES DAILY WITH MEALS 03/18/18 03/18/19  Julieanne Cotton, PA   sodium polystyrene, SPS, with sorbitol (SPS, WITH SORBITOL,) 15-20 gram/60 mL Susp Take 60 mL (15 g) by mouth once a week. 09/03/18   Roma Schanz, MD   ticagrelor (BRILINTA) 90 mg Tab TAKE 1 TABLET BY MOUTH TWICE DAILY 06/10/18 06/10/19  Roma Schanz, MD   traMADol Janean Sark) 50 mg tablet Take 1 tablet (50 mg total) by mouth every six (6) hours as needed. 12/20/17      UNIFINE PENTIPS 31 gauge x 5/16 Ndle USE WITH INSULIN AND VICTOZA 5 TIMES DAILY 06/10/18 06/10/19  Roma Schanz, MD   traZODone (DESYREL) 50 MG tablet Take 1 tablet (50 mg total) by mouth nightly. 09/03/18 09/03/18  Roma Schanz, MD         Social History:  Tobacco use:   reports that he has never smoked. His smokeless tobacco use includes chew.  Alcohol use:   reports no history of alcohol use.  Drug use:  reports  no history of drug use.  Living situation: the patient lives with their family.    Family History:  Family History   Problem Relation Age of Onset   ??? Diabetes Mother    ??? Hyperlipidemia Mother    ??? Heart disease Mother    ??? Basal cell carcinoma Mother    ??? Blindness Mother         diabeties   ??? Obesity Mother    ??? Hyperlipidemia Father    ??? Heart disease Father    ??? Lung disease Father    ??? Heart disease Half-Sister    ??? Kidney disease Half-Sister    ??? Gallbladder disease Half-Brother    ??? Obesity Half-Brother    ??? No Known Problems Daughter    ??? No Known Problems Son    ??? Obesity Daughter    ??? Melanoma Neg Hx    ??? Squamous cell carcinoma Neg Hx    ??? Macular degeneration Neg Hx    ??? Glaucoma Neg Hx        Objective:     Temp:  [36.7 ??C] 36.7 ??C  Heart Rate:  [88-121] 88  Resp:  [17-24] 18  BP: (141-189)/(75-107) 166/82  SpO2:  [95 %-100 %] 98 %  Wt Readings from Last 3 Encounters:   08/12/18 (!) 154.4 kg (340 lb 4.8 oz)   07/22/18 (!) 154.5 kg (340 lb 11.2 oz)   07/02/18 (!) 151.2 kg (333 lb 4.8 oz)       Physical Exam:  General: Alert, resting in bed, NAD  HEENT: Sclera anicteric. Wearing a mask.  Heart: normal rate, regular rhythm, no murmurs rubs or gallops. JVP difficult to appreciate due to body habitus. Radial and DP pulses 2+ BL  Lungs: CTAB w/o wheezes or crackles, no increased work of breathing  Abdomen: Soft, non-tender, non distended. Normal bowel sounds  Extremities: No pitting edema. Legs are warm.  Neuro: AAOx4. No focal deficits.    Labs/Studies:  Labs and Studies from the last 24hrs per EMR and Reviewed

## 2018-09-21 NOTE — Unmapped (Signed)
Pt was able to ambulate from chair to bed, while sitting on the bed pt become unresponsive laying down on bed. Episode lasted for roughly 2 seconds with sternal rub, then patient was talking saying cchest  Pt reports chest pain x1 hours.

## 2018-09-21 NOTE — Unmapped (Signed)
I was the supervising physician in the delivery of the service. Sharaine Delange D Tyrus Wilms, MD

## 2018-09-21 NOTE — Unmapped (Signed)
Emergency Department Provider Note        ED Clinical Impression     Final diagnoses:   NSTEMI (non-ST elevated myocardial infarction) (CMS-[name]) (Primary)       ED Assessment/Plan   48 year old male with intermittent chest pain over the last few days, worsening chest pain tonight.  EKG with normal sinus rhythm, LVH with repolarization abnormality and QTC of 492.  Patient with chest tightness at this time.  Mild hypertension noted.  Patient has elevated troponin to 0.15.  Will give nitroglycerin for his chest discomfort, will start on heparin.  Will discuss with cardiology for transfer from [facility] to main campus.    History     Chief Complaint   Patient presents with   ??? Chest Pain     HPI  48 year old male presents to the emergency department from home with complaint of chest pain.  Patient reports that he has had intermittent chest pain lasting a few hours at a time over the last few days that he attributed to reflux.  Tonight about an hour and a half ago he had onset of severe crushing substernal chest pain associated with numbness and tingling down his left arm and numbness and tingling around his mouth.  Patient reports that he was short of breath and nauseated.  He reports his symptoms have resolved somewhat, but he still feels a tightness in his chest.  He denies any fever or chills.  No vomiting or diarrhea.  Patient was unable to find his home nitroglycerin and so went without.  He reports that he is compliant with his Xarelto and Brilinta.  Patient has history of obesity, chronic kidney disease, coronary disease status post stent most recently in February, diabetes hypertension and hyperlipidemia.  Past Medical History:   Diagnosis Date   ??? Angina at rest (CMS-[name])    ??? Arthritis    ??? Asthma    ??? Atrial fibrillation and flutter (CMS-[name]) 06/08/14   ??? BMI 40.0-44.9, adult (CMS-[name]) 10/29/2015   ??? Chronic anticoagulation 02/17/2018   ??? Chronic kidney disease, stage 3 (CMS-[name])    ??? Chronic pain syndrome 04/11/2014    ~Use daily lyrica as recommended ~Tramadol as needed for pain ~Fluoxetine daily as recommended ~Daily exercise ~Eat a healthy diet ~Good control of your blood sugars can help    ??? Coronary artery disease    ??? Depression    ??? Diabetes mellitus (CMS-[name])    ??? Eye trauma 1998    glass in both eyes and removed   ??? GERD (gastroesophageal reflux disease)    ??? Gout    ??? Homeless    ??? Hyperlipidemia    ??? Hypertension    ??? Illiteracy and low-level literacy    ??? Microscopic hematuria    ??? Migraine    ??? Nephrolithiasis    ??? 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 Nonalcoholic fatty liver disease    ??? NSTEMI (non-ST elevated myocardial infarction) (CMS-[name]) 01/13/2017   ??? Obesity    ??? Obstructive sleep apnea 2009   ??? Panic attacks    ??? 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 Seizure-like activity (CMS-[name])     ~Monitor for symptoms and report them to your primary care provider if they occur    ??? Systolic CHF, chronic (CMS-[name])     EF 40-45% 2017   ??? TIA (transient ischemic attack)        Past Surgical History:   Procedure Laterality Date   ??? CARDIAC CATHETERIZATION  03/08/2007   ??? CYSTOURETHROSCOPY  12/31/2009     Microscopic hematuria   ??? KNEE SURGERY Right 09/23/2006    arthroscopic knee surgery medial and lateral meniscus tears as well as crystal disease   ??? PR CATH PLACE/CORON ANGIO, IMG SUPER/INTERP,R&L HRT CATH, L HRT VENTRIC N/A 02/13/2017    Procedure: Left/Right Heart Catheterization;  Surgeon: Marlaine Hind, MD;  Location: [facility] CATH;  Service: Cardiology   ??? PR CATH PLACE/CORON ANGIO, IMG SUPER/INTERP,W LEFT HEART VENTRICULOGRAPHY N/A 03/17/2017    Procedure: Left Heart Catheterization W Intervention;  Surgeon: Marlaine Hind, MD;  Location: [facility] CATH;  Service: Cardiology   ??? PR CATH PLACE/CORON ANGIO, IMG SUPER/INTERP,W LEFT HEART VENTRICULOGRAPHY N/A 05/12/2018    Procedure: CATH LEFT HEART CATHETERIZATION W INTERVENTION;  Surgeon: Dorathy Kinsman, MD;  Location: [facility] CATH;  Service: Cardiology   ??? PR ECMO/ECLS INITIATION VENO-ARTERIAL N/A 03/19/2017    Procedure: ECMO/ECLS; INITIATION, VENO-ARTERIAL;  Surgeon: Lennie Odor, MD;  Location: MAIN OR [facility];  Service: Cardiac Surgery   ??? PR ECMO/ECLS RMVL PRPH CANNULA OPEN 6 YRS & OLDER Left 03/25/2017    Procedure: ECMO / ECLS PROVIDED BY PHYSICIAN; REMOVAL OF PERIPHERAL (ARTERIAL AND/OR VENOUS) CANNULA(E), OPEN, 6 YEARS AND OLDER;  Surgeon: Alonna Buckler Ikonomidis, MD;  Location: MAIN OR [facility];  Service: Cardiac Surgery   ??? PR EPHYS EVAL W/ ABLATION SUPRAVENT ARRHYTHMIA N/A 07/19/2015    Catheter ablation of cavotricuspid isthmus for atrial flutter [facility])   ??? PR INSER HART PACER XVENOUS ATRIAL N/A 05/30/2015    [employer] dual-chamber pacemaker implant (E.Chung)   ??? PR INSERT/PLACE FLOW DIRECT CATH N/A 03/17/2017    Procedure: Insert Leave In [place];  Surgeon: Marlaine Hind, MD;  Location: [facility] CATH;  Service: Cardiology   ??? PR INSJ/RPLCMT PERM DFB W/TRNSVNS LDS 1/DUAL CHMBR N/A 05/04/2017    Procedure: ICD Implant System (Single/Dual);  Surgeon: Eldred Manges, MD;  Location: [facility] CATH;  Service: Cardiology   ??? PR NEGATIVE PRESSURE WOUND THERAPY DME >50 SQ CM Left 03/25/2017    Procedure: Neg Press Wound Tx (Vac Assist) Incl Topicals, Per Session, Tsa Greater Than/= 50 Cm Squared;  Surgeon: Alonna Buckler Ikonomidis, MD;  Location: MAIN OR [facility];  Service: Cardiac Surgery   ??? PR PRQ TRLUML CORONARY STENT W/ANGIO ONE ART/BRNCH N/A 03/12/2018    Procedure: Percutaneous Coronary Intervention;  Surgeon: Marlaine Hind, MD;  Location: [name] CATH;  Service: Cardiology   ??? PR REBL VES DIRECT,LOW EXTREM Left 03/19/2017    Procedure: Repr Bld Vessel Direct; Lower Extrem;  Surgeon: Boykin Reaper, MD;  Location: MAIN OR [facility];  Service: Vascular   ??? PR REMV ART CLOT ILIAC-POP,LEG INCIS Left 03/25/2017    Procedure: EMBOLECTOMY OR THROMBECTOMY, WITH OR WITHOUT CATHETER; FEMOROPOPLITEAL, AORTOILIAC ARTERY, BY LEG INCISION;  Surgeon: Alonna Buckler Ikonomidis, MD;  Location: MAIN OR [facility];  Service: Cardiac Surgery   ??? PR UPPER GI ENDOSCOPY,BIOPSY N/A 02/28/2016    Procedure: UGI ENDOSCOPY; WITH BIOPSY, SINGLE OR MULTIPLE;  Surgeon: Liane Comber, MD;  [facility];  Service: Gastroenterology   ???  PR UPPER GI ENDOSCOPY,DIAGNOSIS N/A 05/07/2016    Procedure: UGI ENDO, INCLUDE ESOPHAGUS, STOMACH, & DUODENUM &/OR JEJUNUM; DX W/WO COLLECTION SPECIMN, BY BRUSH OR WASH;  Surgeon: Modena Nunnery, MD;  Location: [facility];  Service: Gastroenterology       Family History   Problem Relation Age of Onset   ??? Diabetes Mother    ??? Hyperlipidemia Mother    ??? Heart disease Mother    ??? Basal cell carcinoma Mother    ??? Blindness Mother         diabeties   ??? Obesity Mother    ??? Hyperlipidemia Father    ??? Heart disease Father    ??? Lung disease Father    ??? Heart disease Half-Sister    ??? Kidney disease Half-Sister    ??? Gallbladder disease Half-Brother    ??? Obesity Half-Brother    ??? No Known Problems Daughter    ??? No Known Problems Son    ??? Obesity Daughter    ??? Melanoma Neg Hx    ??? Squamous cell carcinoma Neg Hx    ??? Macular degeneration Neg Hx    ??? Glaucoma Neg Hx        Social History     Socioeconomic History   ??? Marital status: Married     Spouse name: [name]   ??? Number of children: 3   ??? Years of education: 66   ??? Highest education level: Not on file   Occupational History   ??? Occupation: DISABLED     Comment: Former Surveyor, quantity Needs   ??? Financial resource strain: Very hard   ??? Food insecurity     Worry: Not on file     Inability: Not on file   ??? Transportation needs     Medical: Yes     Non-medical: Yes   Tobacco Use   ??? Smoking status: Never Smoker   ??? Smokeless tobacco: Current User     Types: Chew   ??? Tobacco comment: Pt dips 1 can a week, Pt not interested in quitting    Substance and Sexual Activity   ??? Alcohol use: Never     Alcohol/week: 0.0 standard drinks     Frequency: Never     Comment: rare use: couple times a year, small amount   ??? Drug use: No   ??? Sexual activity: Yes     Partners: Female   Lifestyle   ??? Physical activity     Days per week: Not on file     Minutes per session: Not on file   ??? Stress: Not on file   Relationships   ??? Social Wellsite geologist on phone: More than three times a week     Gets together: More than three times a week     Attends religious service: 1 to 4 times per year     Active member of club or organization: No     Attends meetings of clubs or organizations: Never     Relationship status: Married   Other Topics Concern   ??? Do you use sunscreen? No   ??? Tanning bed use? No   ??? Are you easily burned? No   ??? Excessive sun exposure? No   ??? Blistering sunburns? No   Social History Narrative    Information updated:  01/08/16. Reviewed 08/12/17:        Born in [place]    Raised with both parents  Parents died w/in 6 mos of each other. His parents knew they were going to pass and told him just before.    Half sister and 2 half brothers        Married 30 yrs. Wife [name]    2 other children son and daughter, adopted out    Daughter Bridgette, bf DJ        Education - completed through 11th. 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    Former Presenter, broadcasting since age 52    Lives with wife, difficulty paying bills 12/30/17     Lives next door to daughter, husband and their grandson        05/19/18: pt moving to new home in next 5 days- after an eviction. Has Medicaid assistance again. Will be w/ wife [name], they babysit their grandson often.    06/14/18: Pt and husband living with daughter while new location to live is determined.       Review of Systems  10+ point review of systems negative except as detailed in HPI.      Physical Exam     BP 156/86  - Pulse 91  - Temp 36.7 ??C (98.1 ??F) (Oral)  - Resp 20  - SpO2 98%     Physical Exam  Vitals signs and nursing note reviewed.   Constitutional:       General: He is not in acute distress.     Appearance: He is well-developed. He is obese.      Comments: Chronically ill-appearing   HENT:      Head: Normocephalic and atraumatic.      Nose: Nose normal.      Mouth/Throat:      Pharynx: No oropharyngeal exudate.   Eyes:      Conjunctiva/sclera: Conjunctivae normal.      Pupils: Pupils are equal, round, and reactive to light.   Neck:      Musculoskeletal: Normal range of motion and neck supple.      Thyroid: No thyromegaly.      Vascular: No JVD.      Trachea: No tracheal deviation.   Cardiovascular:      Rate and Rhythm: Normal rate and regular rhythm.      Heart sounds: Normal heart sounds. No murmur. No friction rub. No gallop.    Pulmonary:      Effort: Pulmonary effort is normal. No respiratory distress.      Breath sounds: Normal breath sounds. No stridor. No wheezing or rales.   Chest:      Chest wall: No tenderness.   Abdominal:      General: Bowel sounds are normal. There is no distension.      Palpations: Abdomen is soft. There is no mass.      Tenderness: There is no abdominal tenderness. There is no guarding or rebound.   Musculoskeletal: Normal range of motion.         General: No tenderness.   Lymphadenopathy:      Cervical: No cervical adenopathy.   Skin:     General: Skin is warm and dry.      Coloration: Skin is not pale.      Findings: No erythema or rash.   Neurological:      Mental Status: He is  alert and oriented to person, place, and time.      Motor: No abnormal muscle tone. Coordination: Coordination normal.      Deep Tendon Reflexes: Reflexes are normal and symmetric.   Psychiatric:         Behavior: Behavior normal.         Thought Content: Thought content normal.         Judgment: Judgment normal.         ED Course           MDM  Reviewed: previous chart, nursing note and vitals  Reviewed previous: labs, ECG and x-ray  Interpretation: labs, ECG and x-ray  Consults: cardiology         Desma Mcgregor, MD  09/21/18 [phone]

## 2018-09-21 NOTE — Unmapped (Signed)
You should charge this for CCM.  I think there is something messed up with his insurance that's listed.  There shouldn't be a  Reason he is dropped off medicare at the middle of the year.

## 2018-09-21 NOTE — Unmapped (Addendum)
Jeremy Oliver is a 48  y.o. 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 He has a complex cardiac history salient for many PCIs. He is currently chest pain free. In light of his poor renal function, opted for medical management: trended troponins, heparinized, and monitored with serial EKGs. Troponin peaked at ***  ??  Concern for VF - resolved  History of pseudoseizures  EMS concerned in transport that patient had an episode of VF. Presumed event in ambulance was a pseudoseizure since device was interrogated by EP nurse and cardiology fellow: no discharges or dysrhythmias.  ??  Chest pain - CAD s/p multiple PCIs: History of extensive CAD with multiple stents. Had an STEMI in 2018 with VT arrest (s/p ICD) with DES to mLAD. Presented with NSTEMI on 03/12/2018 and had 2x DES placed in??the mid RCA and RPLA/RPDA bifurcation.??Another NSTEMI in February 2020 w/??PCI to proximal/mid LAD. Patient presented again in 06/2018 with chest pain associated with persistent abdominal pain and was found to have NSTEMI (Type 1 vs Type 2). LDL elevated in 5/20 at 105. Hemoglobin A1c 9.5 in 06/2018. EKG w/ TWI in lateral leads unchanged from prior. Initial troponin 0.150.    Trended troponins which peaked at ***. Placed on a heparin drip. Continued aspirin and ticagrelor 90mg  BID.    Held home rivaroxaban while heparin drip infusing.   ??  HFrEF: 2/2 ischemic cardiomyopathy.TTE 05/2018 showed EF of 40-45% with segmental wall motion abnormalities in the distribution of the anterior descending artery, unchanged from the previous study.  -- Home carvedilol   -- Bumex 2mg  daily   ??  Paroxysmal Afib: NSR on EKG in ED.   -- Anticoagulation with heparin for home rivaroxaban; plan for resumption of rivaroxaban on discharge***  -- Rate control with carvedilol   -- Amiodarone 200 mg daily   ??  T2DM: Poorly controlled with most recent A1c 9.5. Insulin dependent at home.   -- Lantus 40 QHS for levemir 40   -- SSI   -- ACHS Glucose checks   ??  CKD Stage IV: Highly variable baseline but more recently appears to be in the 3.5-4.0 range. 3.58 on admission today.   -- Avoid nephrotoxins   -- Trend creatinine   -- Monitor I's and O's   -- Home sevelamer TID w/ meals   ??  HLD: As above. On Praluent injections at home.   Gout: allopurinol 50mg  daily   GERD: pantoprazole for home omeprazole

## 2018-09-21 NOTE — Unmapped (Signed)
Pt's wife called in saying Jeremy Oliver had taken a nitro SL x1 and it worked for [employer].  Spoke to pt re: CP. Pt states, I think it's acid reflux but then I don't because I am not throwing up, I usually throw up with acid reflux  Reminded pt needs to go to ER for CP , argues needs to help his wife and/or doesn't want his wife waiting in the lobby. Discussed seriousness of cardiac problems, and better she waiting on him than him dying from avoiding the ER.  Verbalizes understanding.    When hanging up phone pt insists he does not have chest pain, wife insists she knows something isn't right.     Pt states wife does not legally drive, but can drive, and she is on his insurance.    Reviewed need for ER when CP is not relieved by nitro. SL x1 wait 5 minutes and after 2nd pill call 911.  Both pt and wife verbalize understanding.

## 2018-09-21 NOTE — Unmapped (Signed)
Jeremy Oliver was seen by the physician on duty prior to me.    EMS arrived to take the patient to [facility] for N STEMI.    While en route to the hospital the patient had a syncopal episode.  EMS states that the patient was alert and talking to them when he briefly became unconscious.  With this he had artifact on his EKG and mild associated tremors.  This made the monitor difficult to read.  However, the patient received a single shock from his internal defibrillator.    EMS returned to [facility].    Here the patient states that he has been kicked in the chest by a [place], but otherwise is alert and has no difficulty breathing.    Twelve-lead EKG is unchanged compared to previous.    Given the patient is awake and alert and patient is not indicated at this time.    Should the patient have another episode of V. fib requiring shock he will be given amiodarone 300 mg IV en route.    EMS will continue to [facility] for care of his presumed underlying ischemia.    Case was discussed with cardiology.

## 2018-09-21 NOTE — Unmapped (Signed)
ICD Interrogation  RE: routine    Manufacturer: Advertising copywriter: Vigilant EL ICD  Type of Device: Oceanographer    Battery: 12 yearsestimated longevity    Mode: DDD  Lower rate limit 70  bpm  Tachy Settings:      Presenting Rhythm: AS/VS  Underlying rhythm: SR 84   Dependent:  No    Pacing Percentages  AP: 17  RVP: <1    Impedence (ohms)  Atrial lead 630  Right Ventricular lead 311  HV/SVC 50     Sensing (mV)  (P)Atrial lead 5.9  (R)Ventricular lead 13.3    Capture threshold (V @ ms)  Atrial lead 1.0 V @ 0.40 ms  Right Ventricular lead 0.8 V @ 0.40 ms    Diagnostics/Episodes  ?? No recent events recorded     Reprogramming  ?? None    See PDF in media tab for full interrogation record    Questions, please call the Ep Device Rn via Vocera or dial ext [phone]  Ep Device clinic [phone]

## 2018-09-22 LAB — HEPARIN CORRELATION: Lab: 0.3

## 2018-09-22 LAB — TROPONIN I
Troponin I.cardiac:MCnc:Pt:Ser/Plas:Qn:: 0.375
Troponin I.cardiac:MCnc:Pt:Ser/Plas:Qn:: 0.416

## 2018-09-22 LAB — RENAL FUNCTION PANEL
ALBUMIN: 3.1 g/dL — ABNORMAL LOW (ref 3.5–5.0)
ANION GAP: 9 mmol/L (ref 7–15)
BUN / CREAT RATIO: 9
CALCIUM: 8.8 mg/dL (ref 8.5–10.2)
CHLORIDE: 105 mmol/L (ref 98–107)
CO2: 22 mmol/L (ref 22.0–30.0)
CREATININE: 3.74 mg/dL — ABNORMAL HIGH (ref 0.70–1.30)
EGFR CKD-EPI AA MALE: 20 mL/min/{1.73_m2} — ABNORMAL LOW (ref >=60)
EGFR CKD-EPI NON-AA MALE: 18 mL/min/{1.73_m2} — ABNORMAL LOW (ref >=60)
GLUCOSE RANDOM: 139 mg/dL (ref 70–179)
PHOSPHORUS: 5.2 mg/dL — ABNORMAL HIGH (ref 2.9–4.7)
SODIUM: 136 mmol/L (ref 135–145)

## 2018-09-22 LAB — CBC
HEMOGLOBIN: 9.4 g/dL — ABNORMAL LOW (ref 13.5–17.5)
MEAN CORPUSCULAR HEMOGLOBIN CONC: 30.8 g/dL — ABNORMAL LOW (ref 31.0–37.0)
MEAN CORPUSCULAR HEMOGLOBIN: 25 pg — ABNORMAL LOW (ref 26.0–34.0)
MEAN CORPUSCULAR VOLUME: 81.1 fL (ref 80.0–100.0)
MEAN PLATELET VOLUME: 8.3 fL (ref 7.0–10.0)
PLATELET COUNT: 253 10*9/L (ref 150–440)
RED BLOOD CELL COUNT: 3.78 10*12/L — ABNORMAL LOW (ref 4.50–5.90)
RED CELL DISTRIBUTION WIDTH: 18.5 % — ABNORMAL HIGH (ref 12.0–15.0)

## 2018-09-22 LAB — ANION GAP: Anion gap 3:SCnc:Pt:Ser/Plas:Qn:: 9

## 2018-09-22 LAB — PROTIME: Lab: 11.4

## 2018-09-22 LAB — HEMOGLOBIN: Hemoglobin:MCnc:Pt:Bld:Qn:: 9.4 — ABNORMAL LOW

## 2018-09-22 LAB — MAGNESIUM: Magnesium:MCnc:Pt:Ser/Plas:Qn:: 1.7

## 2018-09-22 MED ORDER — ISOSORBIDE MONONITRATE ER 30 MG TABLET,EXTENDED RELEASE 24 HR
ORAL_TABLET | Freq: Every day | ORAL | 0 refills | 0.00000 days | Status: SS
Start: 2018-09-22 — End: 2018-09-30

## 2018-09-22 NOTE — Unmapped (Addendum)
Jeremy Oliver 's Praluent shipment will be delayed due to Refill too soon until 07/12 We have contacted the patient and communicated the delivery change to patient/caregiver We will call the patient to reschedule the delivery upon resolution. We have confirmed the delivery date as 07/13 .  -We will be shipping medication to [facility] until other wise told,pt is currently homeless .

## 2018-09-22 NOTE — Unmapped (Signed)
Problem: Adult Inpatient Plan of Care  Goal: Plan of Care Review  Outcome: Progressing  Goal: Patient-Specific Goal (Individualization)  Outcome: Progressing  Goal: Absence of Hospital-Acquired Illness or Injury  Outcome: Progressing  Goal: Optimal Comfort and Wellbeing  Outcome: Progressing  Goal: Readiness for Transition of Care  Outcome: Progressing  Goal: Rounds/Family Conference  Outcome: Progressing     Problem: Fall Injury Risk  Goal: Absence of Fall and Fall-Related Injury  Outcome: Progressing     Problem: Adjustment to Illness (Heart Failure)  Goal: Optimal Coping  Outcome: Progressing     Problem: Cardiac Output Decreased (Heart Failure)  Goal: Optimal Cardiac Output  Outcome: Progressing     Problem: Fluid Imbalance (Heart Failure)  Goal: Fluid Balance  Outcome: Progressing     Problem: Oral Intake Inadequate (Heart Failure)  Goal: Optimal Nutrition Intake  Outcome: Progressing     Problem: Respiratory Compromise (Heart Failure)  Goal: Effective Oxygenation and Ventilation  Outcome: Progressing     Problem: Sleep Disordered Breathing (Heart Failure)  Goal: Effective Breathing Pattern During Sleep  Outcome: Progressing     Problem: Diabetes Comorbidity  Goal: Blood Glucose Level Within Desired Range  Outcome: Progressing     Problem: Hypertension Comorbidity  Goal: Blood Pressure in Desired Range  Outcome: Progressing     Problem: Pain Chronic (Persistent) (Comorbidity Management)  Goal: Acceptable Pain Control and Functional Ability  Outcome: Progressing   VSS throughout the shift. NSR on tele monitor. No complaints of pain or discomfort. All meds given as ordered. Discharge education completed, pt verbalized understanding on discharge instruction and discharge information. No other discharge needs noticed.

## 2018-09-22 NOTE — Unmapped (Signed)
Problem: Adult Inpatient Plan of Care  Goal: Plan of Care Review  Outcome: Progressing  Goal: Patient-Specific Goal (Individualization)  Outcome: Progressing  Goal: Absence of Hospital-Acquired Illness or Injury  Outcome: Progressing  Goal: Optimal Comfort and Wellbeing  Outcome: Progressing  Goal: Readiness for Transition of Care  Outcome: Progressing  Goal: Rounds/Family Conference  Outcome: Progressing     Problem: Fall Injury Risk  Goal: Absence of Fall and Fall-Related Injury  Outcome: Progressing     Problem: Oral Intake Inadequate (Heart Failure)  Goal: Optimal Nutrition Intake  Outcome: Progressing     Problem: Cardiac Output Decreased (Heart Failure)  Goal: Optimal Cardiac Output  Outcome: Progressing     Problem: Respiratory Compromise (Heart Failure)  Goal: Effective Oxygenation and Ventilation  Outcome: Progressing     Problem: Diabetes Comorbidity  Goal: Blood Glucose Level Within Desired Range  Outcome: Progressing     Problem: Hypertension Comorbidity  Goal: Blood Pressure in Desired Range  Outcome: Progressing   Pt remains A&Ox4. VSS and has denied having discomforts. Glycemia monitoring and anticoagulation therapies progressing. D/c planning ongoing

## 2018-09-22 NOTE — Unmapped (Signed)
Problem: Adult Inpatient Plan of Care  Goal: Plan of Care Review  Outcome: Progressing  Goal: Patient-Specific Goal (Individualization)  Outcome: Progressing  Goal: Absence of Hospital-Acquired Illness or Injury  Outcome: Progressing  Goal: Optimal Comfort and Wellbeing  Outcome: Progressing  Goal: Readiness for Transition of Care  Outcome: Progressing  Goal: Rounds/Family Conference  Outcome: Progressing     Problem: Fall Injury Risk  Goal: Absence of Fall and Fall-Related Injury  Outcome: Progressing     Problem: Adjustment to Illness (Heart Failure)  Goal: Optimal Coping  Outcome: Progressing     Problem: Cardiac Output Decreased (Heart Failure)  Goal: Optimal Cardiac Output  Outcome: Progressing     Problem: Fluid Imbalance (Heart Failure)  Goal: Fluid Balance  Outcome: Progressing     Problem: Oral Intake Inadequate (Heart Failure)  Goal: Optimal Nutrition Intake  Outcome: Progressing     Problem: Respiratory Compromise (Heart Failure)  Goal: Effective Oxygenation and Ventilation  Outcome: Progressing     Problem: Sleep Disordered Breathing (Heart Failure)  Goal: Effective Breathing Pattern During Sleep  Outcome: Progressing   Transfer from [facility] ED. NSR on tele. ICD interrogated once pt arrived the unit due to conner of VF&ICD firing. All medication administered, heparin infusing at 13units/kg/hr. Tylenol given to manage headache, with good effect. Covid-19 screening performed this shift. Plass d/c tomorrow. Pt is resting in bed comfortably at present, call bell within reach. Will continue to monitor closely.

## 2018-09-23 NOTE — Unmapped (Signed)
Physician Discharge Summary [facility]  3 AD [facility]  [address]  [place] [address]  Dept: [phone]  Loc: [phone]     Identifying Information:   Jeremy Oliver  1971/02/23  [id number]    Primary Care Physician: Kurtis Bushman, MD   Code Status: Full Code    Admit Date: 09/21/2018    Discharge Date: 09/22/2018     Discharge To: Home    Discharge Service: CARD [name] 2     Discharge Attending Physician: No att. providers found    Discharge Diagnoses:  Active Problems:    CAD (coronary artery disease) (RAF-[name]) POA: Yes    Idiopathic chronic gout of left knee POA: Yes    Hypercholesteremia POA: Yes    Essential hypertension (RAF-[name]) POA: Yes    Obesity, Class III, BMI 40-49.9 (morbid obesity) (CMS-[name]) POA: Yes    GERD (gastroesophageal reflux disease) POA: Yes    Paroxysmal atrial fibrillation (CMS-[name]) POA: Yes    OSA (obstructive sleep apnea) POA: Yes    Tobacco use disorder POA: Yes    Diabetes mellitus with gastroparesis (CMS-[name]) POA: Yes    Chronic heart failure with reduced ejection fraction and diastolic dysfunction (CMS-[name]) POA: Yes    CKD (chronic kidney disease) stage 4, GFR 15-29 ml/min (CMS-[name]) POA: Yes  Resolved Problems:    * No resolved hospital problems. *      Hospital Course:   Jeremy Jeremy Oliver is a 48  y.o. 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 He has a complex cardiac history salient for many PCIs. He is currently chest pain free. In light of his poor renal function, opted for medical management: trended troponins, heparinized, and monitored with serial EKGs. Troponin peaked at 0.536.  ??  Concern for VF - resolved  History of pseudoseizures  EMS concerned in transport that patient had an episode of VF. Presumed event in ambulance was a pseudoseizure since device was interrogated by EP nurse and cardiology fellow: no discharges or dysrhythmias.  ??  Chest pain - CAD s/p multiple PCIs: History of extensive CAD with multiple stents. Had an STEMI in 2018 with VT arrest (s/p ICD) with DES to mLAD. Presented with NSTEMI on 03/12/2018 and had 2x DES placed in??the mid RCA and RPLA/RPDA bifurcation.??Another NSTEMI in February 2020 w/??PCI to proximal/mid LAD. Patient presented again in 06/2018 with chest pain associated with persistent abdominal pain and was found to have NSTEMI (Type 1 vs Type 2). LDL elevated in 5/20 at 105. Hemoglobin A1c 9.5 in 06/2018. EKG w/ TWI in lateral leads unchanged from prior. Initial troponin 0.150.    Trended troponins which peaked at 0.536. Placed on a heparin drip. Continued aspirin and ticagrelor 90mg  BID.    Held home rivaroxaban while heparin drip infusing.   ??  HFrEF: 2/2 ischemic cardiomyopathy.TTE 05/2018 showed EF of 40-45% with segmental wall motion abnormalities in the distribution of the anterior descending artery, unchanged from the previous study.  -- Home carvedilol   -- Bumex 2mg  daily   ??  Paroxysmal Afib: NSR on EKG in ED.   -- Anticoagulation with heparin for home rivaroxaban; plan for resumption of rivaroxaban on discharge.  -- Rate control with carvedilol   -- Amiodarone 200 mg daily   ??  T2DM: Poorly controlled with most recent A1c 9.5. Insulin dependent at home.   -- Lantus 40 QHS  for levemir 40   -- SSI   -- ACHS Glucose checks   ??  CKD Stage IV: Highly variable baseline but more recently appears to be in the 3.5-4.0 range. 3.58 on admission today.   -- Avoid nephrotoxins   -- Trend creatinine   -- Monitor I's and O's   -- Home sevelamer TID w/ meals   ??  HLD: As above. On Praluent injections at home.   Gout: allopurinol 50mg  daily   GERD: pantoprazole for home omeprazole   ??  Touchbase with Outpatient Provider:  Warm Handoff: Not completed secondary to unable to contact provider.    Procedures:  cardiac catheterization  No admission procedures for hospital encounter. ______________________________________________________________________  Discharge Medications:     Your Medication List      CHANGE how you take these medications    isosorbide mononitrate 30 MG 24 hr tablet  Commonly known as:  IMDUR  Take 2 tablets (60 mg total) by mouth daily.  What changed:  how much to take  Notes to patient:  Next dose: tomorrow        CONTINUE taking these medications    acetaminophen 500 MG tablet  Commonly known as:  TYLENOL  TAKE 2 TABLETS (1 000MG ) BY MOUTH EVERY 8 HOURS AS NEEDED FOR PAIN  Notes to patient:  Next dose: as needed     allopurinoL 100 MG tablet  Commonly known as:  ZYLOPRIM  Take 0.5 tablets (50 mg total) by mouth daily.  Notes to patient:  Next dose: tomorrow     amiodarone 200 MG tablet  Commonly known as:  PACERONE  TAKE 1 TABLET BY MOUTH ONCE DAILY  Notes to patient:  Next dose: tomorrow     arm brace Misc  1 each by Miscellaneous Jeremy daily.     atorvastatin 80 MG tablet  Commonly known as:  LIPITOR  Take 1 tablet (80 mg total) by mouth daily.  Notes to patient:  Next dose: tomorrow     blood sugar diagnostic Strp  USE TO CHECK BLOOD SUGAR 4 TIMES DAILY (BEFORE MEALS AND NIGHTLY)     blood-glucose meter kit  Use to check blood sugars, #1 meter, #100 strips. Freestyle glucose meter. ICD-9 250.00     BRILINTA 90 mg Tab  Generic drug:  ticagrelor  TAKE 1 TABLET BY MOUTH TWICE DAILY  Notes to patient:  Next dose: tonight     bumetanide 2 MG tablet  Commonly known as:  BUMEX  Take 1 tablet (2 mg total) by mouth daily.  Notes to patient:  Next dose: tomorrow     carvediloL 25 MG tablet  Commonly known as:  COREG  TAKE 1 TABLET BY MOUTH WITH THE MORNING MEAL AND TAKE 2 TABLETS WITH THE EVENING MEAL  Notes to patient:  Next dose: tonight     cholecalciferol (vitamin D3) 5,000 unit capsule  Take 1 capsule (5,000 Units total) by mouth daily.  Notes to patient:  Next dose: tomorrow     diclofenac sodium 1 % gel  Commonly known as:  VOLTAREN  Apply 2 grams topically Three (3) times a day.  Notes to patient:  Next dose: tonight     empty container Misc  Use as directed to dispose of injectable medications     ezetimibe 10 mg tablet  Commonly known as:  ZETIA  TAKE 1 TABLET BY MOUTH ONCE DAILY  Notes to patient:  Next dose: tonight     FLUoxetine 40 MG capsule  Commonly known as:  PROzac  Take 2 capsules (80 mg total) by mouth daily.  Notes to patient:  Next dose: tomorrow     gabapentin 300 MG capsule  Commonly known as:  NEURONTIN  Take 1 capsule (300 mg total) by mouth two (2) times a day.  Notes to patient:  Next dose: tonight     insulin syringe-needle U-100 0.3 mL 31 gauge x 5/16 (8 mm) Syrg  Use to inject insulin twice daily with meals     LEVEMIR FLEXTOUCH U-100 INSULN 100 unit/mL (3 mL) injection pen  Generic drug:  insulin detemir U-100  Inject 0.4 mL (40 Units total) under the skin nightly.  Notes to patient:  Next dose: tonight     melatonin 3 mg Cap  Take 9 mg by mouth nightly.  Notes to patient:  Next dose: tonight     nitroglycerin 0.4 MG SL tablet  Commonly known as:  NITROSTAT  Place 1 tablet (0.4 mg total) under the tongue every five (5) minutes as needed for chest pain.  Notes to patient:  Next dose: as needed     NovoLOG Flexpen U-100 Insulin 100 unit/mL (3 mL) injection pen  Generic drug:  insulin ASPART  Inject 0.2 mL (20 Units total) under the skin three (3) times a day with a meal.  Notes to patient:  Next dose: with meals     omeprazole 20 MG capsule  Commonly known as:  PriLOSEC  Take 1 capsule (20 mg total) by mouth daily.  Notes to patient:  Next dose: tomorrow     ondansetron 4 MG tablet  Commonly known as:  ZOFRAN  Take 1 tablet (4 mg total) by mouth every eight (8) hours as needed for nausea for up to 4 days.  Notes to patient:  Next dose: as needed     oxyCODONE-acetaminophen 5-325 mg per tablet  Commonly known as:  PERCOCET  Take 1 tablet by mouth every eight (8) hours as needed.  Notes to patient:  Next dose: as needed     OZEMPIC 0.25 mg or 0.5 mg(2 mg/1.5 mL) Pnij  Generic drug:  semaglutide  Inject 0.5 mg under the skin every seven (7) days.  Notes to patient:  Next dose: resume home schedule     PRALUENT PEN 150 mg/mL subcutaneous injection  Generic drug:  alirocumab  Inject 1 mL (150 mg total) under the skin every fourteen (14) days.  Notes to patient:  Next dose: resume home schedule     QUEtiapine 50 MG tablet  Commonly known as:  SEROqueL  Take 1 tablet (50 mg total) by mouth two (2) times a day as needed (headache).  Notes to patient:  Next dose: as needed     RENVELA 800 mg tablet  Generic drug:  sevelamer  TAKE 2 TABLETS (1600MG ) BY MOUTH THREE TIMES DAILY WITH MEALS  Notes to patient:  Next dose: with meals     SPS (WITH SORBITOL) 15-20 gram/60 mL Susp  Generic drug:  sodium polystyrene (SPS) with sorbitol  Take 60 mL (15 g) by mouth once a week.  Notes to patient:  Next dose: resume home schedule     UNIFINE PENTIPS 31 gauge x 5/16 (8 mm) Ndle  Generic drug:  pen needle, diabetic  USE WITH INSULIN AND VICTOZA 5 TIMES DAILY     VENTOLIN HFA 90 mcg/actuation inhaler  Generic drug:  albuterol  Inhale 2 puffs into the lungs every 6 (six) hours as needed for  wheezing or shortness of breath.  Notes to patient:  Next dose: as needed     XARELTO 15 mg Tab  Generic drug:  rivaroxaban  Take 1 tablet (15 mg total) by mouth daily with evening meal.  Notes to patient:  Next dose: tonight            Allergies:  Losartan and Morphine  ______________________________________________________________________  Pending Test Results (if blank, then none):      Most Recent Labs:  All lab results last 24 hours -   Recent Results (from the past 24 hour(s))   Troponin I    Collection Time: 09/21/18  7:53 PM   Result Value Ref Range    Troponin I 0.564 (HH) <0.034 ng/mL   POCT Glucose    Collection Time: 09/21/18  8:34 PM   Result Value Ref Range    Glucose, POC 126 70 - 179 mg/dL   POCT Glucose    Collection Time: 09/22/18  5:11 AM   Result Value Ref Range    Glucose, POC 230 (H) 70 - 179 mg/dL   APTT    Collection Time: 09/22/18  6:19 AM   Result Value Ref Range    APTT 66.6 (H) 25.9 - 39.5 sec    Heparin Correlation 0.3    Troponin I    Collection Time: 09/22/18  6:19 AM   Result Value Ref Range    Troponin I 0.416 (HH) <0.034 ng/mL   CBC    Collection Time: 09/22/18  6:19 AM   Result Value Ref Range    WBC 8.8 4.5 - 11.0 10*9/L    RBC 3.78 (L) 4.50 - 5.90 10*12/L    HGB 9.4 (L) 13.5 - 17.5 g/dL    HCT 16.1 (L) 09.6 - 53.0 %    MCV 81.1 80.0 - 100.0 fL    MCH 25.0 (L) 26.0 - 34.0 pg    MCHC 30.8 (L) 31.0 - 37.0 g/dL    RDW 04.5 (H) 40.9 - 15.0 %    MPV 8.3 7.0 - 10.0 fL    Platelet 253 150 - 440 10*9/L   PT-INR    Collection Time: 09/22/18  6:19 AM   Result Value Ref Range    PT 11.4 10.2 - 13.1 sec    INR 0.99    Magnesium Level    Collection Time: 09/22/18  6:19 AM   Result Value Ref Range    Magnesium 1.7 1.6 - 2.2 mg/dL   Renal Function Panel    Collection Time: 09/22/18  6:19 AM   Result Value Ref Range    Sodium 136 135 - 145 mmol/L    Potassium 4.8 3.5 - 5.0 mmol/L    Chloride 105 98 - 107 mmol/L    CO2 22.0 22.0 - 30.0 mmol/L    Anion Gap 9 7 - 15 mmol/L    BUN 33 (H) 7 - 21 mg/dL    Creatinine 8.11 (H) 0.70 - 1.30 mg/dL    BUN/Creatinine Ratio 9     EGFR CKD-EPI Non-African American, Male 18 (L) >=60 mL/min/1.7m2    EGFR CKD-EPI African American, Male 20 (L) >=60 mL/min/1.4m2    Glucose 139 70 - 179 mg/dL    Calcium 8.8 8.5 - 91.4 mg/dL    Phosphorus 5.2 (H) 2.9 - 4.7 mg/dL    Albumin 3.1 (L) 3.5 - 5.0 g/dL   Troponin I    Collection Time: 09/22/18  7:48 AM   Result Value Ref Range  Troponin I 0.375 (HH) <0.034 ng/mL   ECG 12 lead    Collection Time: 09/22/18  8:11 AM   Result Value Ref Range    EKG Systolic BP  mmHg    EKG Diastolic BP  mmHg    EKG Ventricular Rate 76 BPM    EKG Atrial Rate 76 BPM    EKG P-R Interval 216 ms    EKG QRS Duration 92 ms    EKG Q-T Interval 430 ms    EKG QTC Calculation 483 ms    EKG Calculated P Axis 64 degrees    EKG Calculated R Axis 7 degrees    EKG Calculated T Axis 112 degrees    QTC Fredericia 465 ms   POCT Glucose    Collection Time: 09/22/18 10:58 AM   Result Value Ref Range    Glucose, POC 120 70 - 179 mg/dL       Relevant Studies/Radiology (if blank, then none):  Xr Chest 2 Views    Result Date: 09/21/2018  EXAM: XR CHEST 2 VIEWS DATE: 09/21/2018 6:39 AM ACCESSION: [id number] UN DICTATED: 09/21/2018 6:43 AM INTERPRETATION LOCATION: [facility]     CLINICAL INDICATION: 48 years old Male with CHEST PAIN      COMPARISON: Chest radiograph 07/22/2018     TECHNIQUE: AP and Lateral Chest Radiographs.     FINDINGS:     Radiographically clear lungs.     No pleural effusion or pneumothorax.     Unremarkable cardiomediastinal silhouette. Unchanged AICD with leads in place. Remote left rib fracture deformities.             No acute airspace disease.    Ep Rn Only - Icd / Pacemaker Evaluation    Result Date: 09/21/2018  Lawana Pai, RN     09/21/2018 10:14 AM ICD Interrogation RE: routine     Manufacturer: Designer, jewellery: Vigilant EL ICD Type of Device: Oceanographer     Battery: 12 yearsestimated longevity     Mode: DDD Lower rate limit 70  bpm Tachy Settings:         Presenting Rhythm: AS/VS Underlying rhythm: SR 84 Dependent:  No     Pacing Percentages AP: 17 RVP: <1     Impedence (ohms) Atrial lead 630 Right Ventricular lead 311 HV/SVC 50  Sensing (mV) (P)Atrial lead 5.9 (R)Ventricular lead 13.3     Capture threshold (V @ ms) Atrial lead 1.0 V @ 0.40 ms Right Ventricular lead 0.8 V @ 0.40 ms     Diagnostics/Episodes ?? No recent events recorded  Reprogramming ?? None     See PDF in media tab for full interrogation record     Questions, please call the Ep Device Rn via Vocera or dial ext [phone] Ep Device clinic [phone]    ______________________________________________________________________  Discharge Instructions:           Other Instructions     Discharge instructions      Discharge instructions      You are admitted to the hospital for chest pain.  This is likely related to your heart disease.  Please continue all your medications as previously prescribed.  We did adjust your isosorbide mononitrate to 60 mg daily.  Please take this as prescribed.  Please continue to follow-up closely with your cardiologist as scheduled - please follow-up within 2-3 weeks of discharge. You are also referred to cardiac rehab.  Please make sure that you are able to perform cardiac rehab.  Follow Up instructions and Outpatient Referrals     AMB REFERRAL TO PHYSICAL THERAPY      Suggest Treatment:  Evaluation with suggestions for treatment    Ambulatory Referral to Cardiac Rehab      Reason for Cardiac Rehab Referral:  Acute MI (STEMI/NSTEMI) within the last 12 months    Do you want ongoing co-management?:  No    Care coordination required?:  No    Discharge instructions      Discharge instructions            Appointments which have been scheduled for you    Sep 29, 2018  1:00 PM EDT  (Arrive by 12:45 PM)  RETURN VIDEO - OTHER with Ebbie Latus, LCSW  [facility] INTERNAL MEDICINE [place] [facility] REGION) [address] [place] [address]  [phone]      Sep 30, 2018  9:00 AM EDT  (Arrive by 8:45 AM)  Renea Ee with [facility] INTERNAL MEDICINE [place] [facility] REGION) [address]  [place] [address]  [phone]      Sep 30, 2018  1:00 PM EDT  (Arrive by 12:45 PM)  RETURN  ATTENDING with Roma Schanz, MD  [facility] INTERNAL MEDICINE [place] [facility] REGION) [address] [place] [address]  [phone]      Oct 08, 2018  8:40 AM EDT  (Arrive by 8:25 AM)  RETURN TELEPHONE with Roma Schanz, MD  [facility] INTERNAL MEDICINE [place] [facility] REGION) [address] [place] [address]  [phone]      Oct 20, 2018 10:30 AM EDT  (Arrive by 10:15 AM)  Danelle Earthly RETURN with Claris Gower, AGNP [facility] HEART VASCULAR [facility] [address] [facility] REGION) [address]  [place] [address]  [phone]      Oct 22, 2018  1:30 PM EDT  (Arrive by 1:15 PM)  RETURN  RHEUMATOLOGY with Monica Martinez, MD  [facility] RHEUMATOLOGY [address] [place] [facility] REGION) [phone] [place] [address]  [phone]      Oct 26, 2018  8:15 AM EDT  (Arrive by 7:45 AM)  REMOTE ICD FOLLOW-UP with [facility] EP REMOTE MONITORING  [facility] EP REMOTE MONITORING [place] [facility] REGION) [address]  [address] [place] [address]  [phone]   This is a remote appointment, you do not need to come into the office.     Nov 18, 2018  1:30 PM EDT  (Arrive by 1:15 PM)  Danelle Earthly RETURN with Marlaine Hind, MD  [facility] HEART VASCULAR CTR [address] [place] [facility] REGION) [address]  [place] [address]  [phone]      Dec 07, 2018  1:30 PM EDT  (Arrive by 1:15 PM)  RETURN  GENERAL with Mila Merry, MD  [place] NEPHROLOGY [facility]) [address]  [place] [address]  [phone]      Jan 25, 2019  8:15 AM EDT  (Arrive by 7:45 AM)  REMOTE ICD FOLLOW-UP with [facility] EP REMOTE MONITORING  [facility] EP REMOTE MONITORING [place] [facility] REGION) [address]  [address] [place] [address]  [phone]  This is a remote appointment, you do not need to come into the office.     Apr 26, 2019  8:15 AM EST  (Arrive by 7:45 AM)  REMOTE ICD FOLLOW-UP with [facility] EP REMOTE MONITORING  [facility] EP REMOTE MONITORING [facility] REGION) [address]  [address] [place] [address]  [phone]   This is a remote appointment, you do not need to come into the office.     Jul 26, 2019  8:15 AM EDT  (Arrive by 7:45 AM) REMOTE ICD FOLLOW-UP with [facility] EP REMOTE MONITORING  [facility] EP REMOTE MONITORING [facility] REGION) [address]  [address] [place] [address]  [phone]   This is a remote appointment, you do not need to come into the office.          ______________________________________________________________________  Discharge Day Services:  BP 140/70  - Pulse 72  - Temp 36.3 ??C (Oral)  - Resp 17  - Ht 190.5 cm (6' 3)  - Wt (!) 154.4 kg (340 lb 6.2 oz)  - SpO2 96%  - BMI 42.55 kg/m??   Pt seen on the day of discharge and determined appropriate for discharge.    Condition at Discharge: fair    Length of Discharge: I spent less than 30 mins in the discharge of this patient.

## 2018-09-27 MED ORDER — GABAPENTIN 300 MG CAPSULE
ORAL_CAPSULE | Freq: Two times a day (BID) | ORAL | 3 refills | 0.00000 days | Status: CP
Start: 2018-09-27 — End: 2018-12-26
  Filled 2018-09-27: qty 180, 90d supply, fill #0

## 2018-09-27 MED FILL — GABAPENTIN 300 MG CAPSULE: 90 days supply | Qty: 180 | Fill #0 | Status: AC

## 2018-09-28 ENCOUNTER — Ambulatory Visit
Admit: 2018-09-28 | Discharge: 2018-09-30 | Disposition: A | Discharge status: Home / Self Care | Payer: MEDICARE | Attending: Rehabilitative and Restorative Service Providers" | Admitting: Cardiovascular Disease

## 2018-09-28 ENCOUNTER — Ambulatory Visit
Admit: 2018-09-28 | Discharge: 2018-09-30 | Disposition: A | Discharge status: Home / Self Care | Payer: MEDICARE | Admitting: Cardiovascular Disease

## 2018-09-28 DIAGNOSIS — R0789 Other chest pain: Principal | ICD-10-CM

## 2018-09-28 LAB — CBC W/ AUTO DIFF
BASOPHILS ABSOLUTE COUNT: 0.1 10*9/L (ref 0.0–0.1)
BASOPHILS RELATIVE PERCENT: 0.7 %
EOSINOPHILS ABSOLUTE COUNT: 0.2 10*9/L (ref 0.0–0.4)
EOSINOPHILS RELATIVE PERCENT: 3.3 %
HEMATOCRIT: 30.1 % — ABNORMAL LOW (ref 41.0–53.0)
HEMOGLOBIN: 9.6 g/dL — ABNORMAL LOW (ref 13.5–17.5)
LARGE UNSTAINED CELLS: 3 % (ref 0–4)
LYMPHOCYTES ABSOLUTE COUNT: 1.4 10*9/L — ABNORMAL LOW (ref 1.5–5.0)
LYMPHOCYTES RELATIVE PERCENT: 19.2 %
MEAN CORPUSCULAR HEMOGLOBIN CONC: 31.9 g/dL (ref 31.0–37.0)
MEAN CORPUSCULAR HEMOGLOBIN: 25.7 pg — ABNORMAL LOW (ref 26.0–34.0)
MEAN CORPUSCULAR VOLUME: 80.6 fL (ref 80.0–100.0)
MONOCYTES ABSOLUTE COUNT: 0.3 10*9/L (ref 0.2–0.8)
MONOCYTES RELATIVE PERCENT: 4.1 %
NEUTROPHILS ABSOLUTE COUNT: 5.2 10*9/L (ref 2.0–7.5)
PLATELET COUNT: 337 10*9/L (ref 150–440)
RED CELL DISTRIBUTION WIDTH: 17.7 % — ABNORMAL HIGH (ref 12.0–15.0)
WBC ADJUSTED: 7.3 10*9/L (ref 4.5–11.0)

## 2018-09-28 LAB — HEPARIN CORRELATION: Lab: 0.2

## 2018-09-28 LAB — COMPREHENSIVE METABOLIC PANEL
ALBUMIN: 3.2 g/dL — ABNORMAL LOW (ref 3.5–5.0)
ALKALINE PHOSPHATASE: 73 U/L (ref 38–126)
ALT (SGPT): 11 U/L (ref <50)
ANION GAP: 8 mmol/L (ref 7–15)
AST (SGOT): 15 U/L — ABNORMAL LOW (ref 19–55)
BILIRUBIN TOTAL: 0.1 mg/dL (ref 0.0–1.2)
BLOOD UREA NITROGEN: 37 mg/dL — ABNORMAL HIGH (ref 7–21)
BUN / CREAT RATIO: 10
CHLORIDE: 108 mmol/L — ABNORMAL HIGH (ref 98–107)
CO2: 22 mmol/L (ref 22.0–30.0)
CREATININE: 3.69 mg/dL — ABNORMAL HIGH (ref 0.70–1.30)
EGFR CKD-EPI AA MALE: 21 mL/min/{1.73_m2} — ABNORMAL LOW (ref >=60)
EGFR CKD-EPI NON-AA MALE: 18 mL/min/{1.73_m2} — ABNORMAL LOW (ref >=60)
GLUCOSE RANDOM: 315 mg/dL — ABNORMAL HIGH (ref 70–179)
PROTEIN TOTAL: 6.5 g/dL (ref 6.5–8.3)
SODIUM: 138 mmol/L (ref 135–145)

## 2018-09-28 LAB — HYPOCHROMIA

## 2018-09-28 LAB — APTT: HEPARIN CORRELATION: 0.2

## 2018-09-28 LAB — PROTIME-INR: PROTIME: 11.5 s (ref 10.2–13.1)

## 2018-09-28 LAB — EGFR CKD-EPI AA MALE: Lab: 21 — ABNORMAL LOW

## 2018-09-28 LAB — TROPONIN I
Troponin I.cardiac:MCnc:Pt:Ser/Plas:Qn:: 0.28
Troponin I.cardiac:MCnc:Pt:Ser/Plas:Qn:: 0.4

## 2018-09-28 LAB — PROTIME: Lab: 11.5

## 2018-09-28 LAB — MAGNESIUM: Magnesium:MCnc:Pt:Ser/Plas:Qn:: 1.7

## 2018-09-28 NOTE — Unmapped (Signed)
[facility]  Emergency Department Provider Note      ED Clinical Impression     Final diagnoses:   NSTEMI (non-ST elevated myocardial infarction) (CMS-[name]) (Primary)       Initial Impression, ED Course, Assessment and Plan     Impression: Jeremy Oliver is a 48 y.o. 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 Exam notable for obese habitus, lungs clear, irregularly irregular rate and rhythm.     Plans for cardiac work up.     Chart review significant for patient recently discharged from the hospital 6/24 after admission for N STEMI.  Patient was managed medically with heparinization.     EKG today significant for new T wave inversions in V4 V5 V6.  Troponin was elevated at 0.28.  Patient was given an aspirin.    Case was discussed with cardiology fellow who recommends that the patient be sent to main campus for further consideration of catheterization, serial troponins, initiation of heparinization if troponin elevation was trending up.    Diagnostics obtained here in the emergency department were shared with the patient as well as the plan for transfer to [facility] for further evaluation.  Patient verbalized understanding agreement with the plan for transfer.        ED Triage Vitals [09/28/18 1422]   Vital Signs Group      Temp 36.9 ??C (98.4 ??F)      Temp Source Oral      Heart Rate 94      SpO2 Pulse       Heart Rate Source       Resp 20      BP 142/92      MAP (mmHg)       BP Location Right arm      BP Method Automatic      Patient Position Sitting   SpO2 95 %            Additional Medical Decision Making     I have reviewed the vital signs and the nursing notes. Labs and radiology results that were available during my care of the patient were independently reviewed by me and considered in my medical decision making.     Case discussed with ED attending Dr. Alvino Chapel who is agreeable with the plan.    ____________________________________________       History     Chief Complaint  Chest Pain      HPI   Jeremy Oliver is a 48 y.o. 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 Patient reports sudden onset chest pressure without radiation that he attributes to getting too hot. Currently, his pain is about 1/10. He has had prior MI's with stent placement, but no CABG. HE recently went on a day trip to the beach and was on his feet a bit resulting in leg swelling bilaterally, which is common for him. He denies any associated diaphoresis, shortness of breath, or nausea at onset of symptoms.  He recently had an increase in his long acting nitrate but no other recent med changes. He has had no fevers, chills, cough, shortness of breath, no hemoptysis, no DVT/PE history,  and no nausea, vomiting, or diarrhea.       Review of Systems:  Constitutional: no fever.  Eyes: no visual changes.  ENT: no sore throat.  Cardiovascular: positive chest pain.  Respiratory: no shortness of breath.  Gastrointestinal: no abdominal pain, no vomiting, no diarrhea.  Genitourinary: no dysuria.  Musculoskeletal: no back pain.  Skin: no rash.  Neurological: no headaches, no focal weakness or numbness.    Past Medical History:   Diagnosis Date   ??? Angina at rest (CMS-[name])    ??? Arthritis    ??? Asthma    ??? Atrial fibrillation and flutter (CMS-[name]) 06/08/14   ??? BMI 40.0-44.9, adult (CMS-[name]) 10/29/2015   ??? Chronic anticoagulation 02/17/2018   ??? Chronic kidney disease, stage 3 (CMS-[name])    ??? Chronic pain syndrome 04/11/2014    ~Use daily lyrica as recommended ~Tramadol as needed for pain ~Fluoxetine daily as recommended ~Daily exercise ~Eat a healthy diet ~Good control of your blood sugars can help    ??? Coronary artery disease    ??? Depression    ??? Diabetes mellitus (CMS-[name])    ??? Eye trauma 1998    glass in both eyes and removed   ??? GERD (gastroesophageal reflux disease)    ??? Gout    ??? Homeless    ??? Hyperlipidemia    ??? Hypertension    ??? Illiteracy and low-level literacy    ??? Microscopic hematuria    ??? Migraine    ??? Nephrolithiasis    ??? 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 Nonalcoholic fatty liver disease    ??? NSTEMI (non-ST elevated myocardial infarction) (CMS-[name]) 01/13/2017   ??? Obesity    ??? Obstructive sleep apnea 2009   ??? Panic attacks    ??? 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 Seizure-like activity (CMS-[name])     ~Monitor for symptoms and report them to your primary care provider if they occur    ??? Systolic CHF, chronic (CMS-[name])     EF 40-45% 2017   ??? TIA (transient ischemic attack)        Patient Active Problem List   Diagnosis   ??? CAD (coronary artery disease) (RAF-[name])   ??? Idiopathic chronic gout of left knee   ??? Hypercholesteremia   ??? Essential hypertension (RAF-[name])   ??? 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 Osteoarthritis   ??? Chronic nonalcoholic liver disease   ??? GERD (gastroesophageal reflux disease)   ??? Mild intermittent asthma without complication   ??? Chronic pain syndrome   ??? Paroxysmal atrial fibrillation (CMS-[name])   ??? OSA (obstructive sleep apnea)   ??? Tobacco use disorder   ??? Status post cardiac pacemaker procedure   ??? Diabetes mellitus with gastroparesis (CMS-[name])   ??? Cardiac pacemaker in situ   ??? Recurrent major depressive disorder, in full remission (CMS-[name])   ??? Adjustment disorder, unspecified   ??? Chronic heart failure with reduced ejection fraction and diastolic dysfunction (CMS-[name])   ??? Hyperkalemia   ??? CKD (  chronic kidney disease) stage 4, GFR 15-29 ml/min (CMS-[name])   ??? Acquired left foot drop   ??? Decreased sensation of lower extremity, left   ??? Chronic anticoagulation   ??? Type 2 diabetes mellitus with stage 4 chronic kidney disease, with long-term current use of insulin (CMS-[name])       Past Surgical History:   Procedure Laterality Date   ??? CARDIAC CATHETERIZATION  03/08/2007   ??? CYSTOURETHROSCOPY  12/31/2009     Microscopic hematuria   ??? KNEE SURGERY Right 09/23/2006    arthroscopic knee surgery medial and lateral meniscus tears as well as crystal disease   ??? PR CATH PLACE/CORON ANGIO, IMG SUPER/INTERP,R&L HRT CATH, L HRT VENTRIC N/A 02/13/2017    Procedure: Left/Right Heart Catheterization;  Surgeon: Marlaine Hind, MD;  Location: [facility] CATH;  Service: Cardiology   ??? PR CATH PLACE/CORON ANGIO, IMG SUPER/INTERP,W LEFT HEART VENTRICULOGRAPHY N/A 03/17/2017    Procedure: Left Heart Catheterization W Intervention;  Surgeon: Marlaine Hind, MD;  Location: [facility] CATH;  Service: Cardiology   ??? PR CATH PLACE/CORON ANGIO, IMG SUPER/INTERP,W LEFT HEART VENTRICULOGRAPHY N/A 05/12/2018    Procedure: CATH LEFT HEART CATHETERIZATION W INTERVENTION;  Surgeon: Dorathy Kinsman, MD;  Location: [facility] CATH;  Service: Cardiology   ??? PR ECMO/ECLS INITIATION VENO-ARTERIAL N/A 03/19/2017    Procedure: ECMO/ECLS; INITIATION, VENO-ARTERIAL;  Surgeon: Lennie Odor, MD;  Location: MAIN OR [facility];  Service: Cardiac Surgery   ??? PR ECMO/ECLS RMVL PRPH CANNULA OPEN 6 YRS & OLDER Left 03/25/2017    Procedure: ECMO / ECLS PROVIDED BY PHYSICIAN; REMOVAL OF PERIPHERAL (ARTERIAL AND/OR VENOUS) CANNULA(E), OPEN, 6 YEARS AND OLDER;  Surgeon: Alonna Buckler Ikonomidis, MD;  Location: MAIN OR [facility];  Service: Cardiac Surgery   ??? PR EPHYS EVAL W/ ABLATION SUPRAVENT ARRHYTHMIA N/A 07/19/2015    Catheter ablation of cavotricuspid isthmus for atrial flutter [facility])   ??? PR INSER HART PACER XVENOUS ATRIAL N/A 05/30/2015    [employer] dual-chamber pacemaker implant (E.Chung)   ??? PR INSERT/PLACE FLOW DIRECT CATH N/A 03/17/2017    Procedure: Insert Leave In [place];  Surgeon: Marlaine Hind, MD;  Location: [facility] CATH;  Service: Cardiology   ??? PR INSJ/RPLCMT PERM DFB W/TRNSVNS LDS 1/DUAL CHMBR N/A 05/04/2017    Procedure: ICD Implant System (Single/Dual);  Surgeon: Eldred Manges, MD;  Location: [facility] CATH;  Service: Cardiology   ??? PR NEGATIVE PRESSURE WOUND THERAPY DME >50 SQ CM Left 03/25/2017    Procedure: Neg Press Wound Tx (Vac Assist) Incl Topicals, Per Session, Tsa Greater Than/= 50 Cm Squared;  Surgeon: Alonna Buckler Ikonomidis, MD;  Location: MAIN OR [facility];  Service: Cardiac Surgery   ??? PR PRQ TRLUML CORONARY STENT W/ANGIO ONE ART/BRNCH N/A 03/12/2018    Procedure: Percutaneous Coronary Intervention;  Surgeon: Marlaine Hind, MD;  Location: [facility] CATH;  Service: Cardiology   ??? PR REBL VES DIRECT,LOW EXTREM Left 03/19/2017    Procedure: Repr Bld Vessel Direct; Lower Extrem;  Surgeon: Boykin Reaper, MD;  Location: MAIN OR [facility];  Service: Vascular   ??? PR REMV ART CLOT ILIAC-POP,LEG INCIS Left 03/25/2017    Procedure: EMBOLECTOMY OR THROMBECTOMY, WITH OR WITHOUT CATHETER; FEMOROPOPLITEAL, AORTOILIAC ARTERY, BY LEG INCISION;  Surgeon: Alonna Buckler Ikonomidis, MD;  Location: MAIN OR [facility];  Service: Cardiac Surgery   ??? PR UPPER GI ENDOSCOPY,BIOPSY N/A 02/28/2016    Procedure: UGI ENDOSCOPY; WITH BIOPSY, SINGLE OR MULTIPLE;  Surgeon: Liane Comber, MD;  Location: [facility];  Service: Gastroenterology   ???  PR UPPER GI ENDOSCOPY,DIAGNOSIS N/A 05/07/2016    Procedure: UGI ENDO, INCLUDE ESOPHAGUS, STOMACH, & DUODENUM &/OR JEJUNUM; DX W/WO COLLECTION SPECIMN, BY BRUSH OR WASH;  Surgeon: Modena Nunnery, MD;  Location: [facility];  Service: Gastroenterology       No current facility-administered medications for this encounter.     Current Outpatient Medications:   ???  acetaminophen (TYLENOL) 500 MG tablet, TAKE 2 TABLETS (1 000MG ) BY MOUTH EVERY 8 HOURS AS NEEDED FOR PAIN, Disp: 180 each, Rfl: 3  ???  albuterol HFA 90 mcg/actuation inhaler, Inhale 2 puffs into the lungs every 6 (six) hours as needed for wheezing or shortness of breath., Disp: 18 g, Rfl: 2  ???  alirocumab (PRALUENT) 150 mg/mL subcutaneous injection, Inject 1 mL (150 mg total) under the skin every fourteen (14) days., Disp: 4 Syringe, Rfl: 11  ???  allopurinoL (ZYLOPRIM) 100 MG tablet, Take 0.5 tablets (50 mg total) by mouth daily., Disp: 45 tablet, Rfl: 3  ???  amiodarone (PACERONE) 200 MG tablet, TAKE 1 TABLET BY MOUTH ONCE DAILY, Disp: 90 tablet, Rfl: 3  ???  arm brace Misc, 1 each by Miscellaneous route daily., Disp: 1 each, Rfl: 0  ???  atorvastatin (LIPITOR) 80 MG tablet, Take 1 tablet (80 mg total) by mouth daily., Disp: 90 tablet, Rfl: 3  ???  blood sugar diagnostic Strp, USE TO CHECK BLOOD SUGAR 4 TIMES DAILY (BEFORE MEALS AND NIGHTLY), Disp: 100 each, Rfl: 3  ???  blood-glucose meter kit, Use to check blood sugars, #1 meter, #100 strips. Freestyle glucose meter. ICD-9 250.00, Disp: 1 each, Rfl: 0  ???  bumetanide (BUMEX) 2 MG tablet, Take 1 tablet (2 mg total) by mouth daily., Disp: 90 tablet, Rfl: 3  ???  carvediloL (COREG) 25 MG tablet, TAKE 1 TABLET BY MOUTH WITH THE MORNING MEAL AND TAKE 2 TABLETS WITH THE EVENING MEAL, Disp: 270 tablet, Rfl: 3  ???  cholecalciferol, vitamin D3, 5,000 unit capsule, Take 1 capsule (5,000 Units total) by mouth daily., Disp: 100 capsule, Rfl: 0  ???  diclofenac sodium (VOLTAREN) 1 % gel, Apply 2 grams topically Three (3) times a day., Disp: 100 g, Rfl: 11  ???  empty container Misc, Use as directed to dispose of injectable medications, Disp: 1 each, Rfl: 3  ???  ezetimibe (ZETIA) 10 mg tablet, TAKE 1 TABLET BY MOUTH ONCE DAILY, Disp: 90 each, Rfl: 3  ???  FLUoxetine (PROZAC) 40 MG capsule, Take 2 capsules (80 mg total) by mouth daily., Disp: 180 capsule, Rfl: 3  ???  gabapentin (NEURONTIN) 300 MG capsule, Take 1 capsule (300 mg total) by mouth two (2) times a day., Disp: 180 capsule, Rfl: 3  ???  insulin ASPART (NOVOLOG FLEXPEN U-100 INSULIN) 100 unit/mL (3 mL) injection pen, Inject 0.2 mL (20 Units total) under the skin three (3) times a day with a meal., Disp: 15 mL, Rfl: 11  ???  insulin detemir U-100 (LEVEMIR) 100 unit/mL (3 mL) injection pen, Inject 0.4 mL (40 Units total) under the skin nightly., Disp: 15 mL, Rfl: 3  ???  insulin syringe-needle U-100 0.3 mL 31 gauge x 5/16 Syrg, Use to inject insulin twice daily with meals, Disp: 60 each, Rfl: 0  ???  isosorbide mononitrate (IMDUR) 30 MG 24 hr tablet, Take 2 tablets (60 mg total) by mouth daily., Disp: 60 tablet, Rfl: 0  ???  melatonin 3 mg cap, Take 9 mg by mouth nightly., Disp: , Rfl:   ???  nitroglycerin (  NITROSTAT) 0.4 MG SL tablet, Place 1 tablet (0.4 mg total) under the tongue every five (5) minutes as needed for chest pain., Disp: 100 each, Rfl: 3  ???  omeprazole (PRILOSEC) 20 MG capsule, Take 1 capsule (20 mg total) by mouth daily., Disp: 90 capsule, Rfl: 3  ???  ondansetron (ZOFRAN) 4 MG tablet, Take 1 tablet (4 mg total) by mouth every eight (8) hours as needed for nausea for up to 4 days., Disp: 10 tablet, Rfl: 0  ???  oxyCODONE-acetaminophen (PERCOCET) 5-325 mg per tablet, Take 1 tablet by mouth every eight (8) hours as needed. (Patient not taking: Reported on 08/12/2018), Disp: 12 tablet, Rfl: 0  ???  QUEtiapine (SEROQUEL) 50 MG tablet, Take 1 tablet (50 mg total) by mouth two (2) times a day as needed (headache)., Disp: 180 tablet, Rfl: 3  ???  rivaroxaban (XARELTO) 15 mg Tab, Take 1 tablet (15 mg total) by mouth daily with evening meal., Disp: 90 tablet, Rfl: 3  ???  semaglutide 0.25 mg or 0.5 mg(2 mg/1.5 mL) PnIj, Inject 0.5 mg under the skin every seven (7) days., Disp: 4.5 mL, Rfl: 3  ???  sevelamer (RENVELA) 800 mg tablet, TAKE 2 TABLETS (1600MG ) BY MOUTH THREE TIMES DAILY WITH MEALS, Disp: 540 tablet, Rfl: 3  ???  sodium polystyrene, SPS, with sorbitol (SPS, WITH SORBITOL,) 15-20 gram/60 mL Susp, Take 60 mL (15 g) by mouth once a week., Disp: 473 mL, Rfl: 3  ???  ticagrelor (BRILINTA) 90 mg Tab, TAKE 1 TABLET BY MOUTH TWICE DAILY, Disp: 180 tablet, Rfl: 3  ???  UNIFINE PENTIPS 31 gauge x 5/16 Ndle, USE WITH INSULIN AND VICTOZA 5 TIMES DAILY, Disp: 400 each, Rfl: 3    Allergies  Losartan and Morphine    Family History   Problem Relation Age of Onset   ??? Diabetes Mother    ??? Hyperlipidemia Mother    ??? Heart disease Mother    ??? Basal cell carcinoma Mother    ??? Blindness Mother         diabeties   ??? Obesity Mother    ??? Hyperlipidemia Father    ??? Heart disease Father    ??? Lung disease Father    ??? Heart disease Half-Sister    ??? Kidney disease Half-Sister    ??? Gallbladder disease Half-Brother    ??? Obesity Half-Brother    ??? No Known Problems Daughter    ??? No Known Problems Son    ??? Obesity Daughter    ??? Melanoma Neg Hx    ??? Squamous cell carcinoma Neg Hx    ??? Macular degeneration Neg Hx    ??? Glaucoma Neg Hx        Social History  Social History     Tobacco Use   ??? Smoking status: Never Smoker   ??? Smokeless tobacco: Current User     Types: Chew   ??? Tobacco comment: Pt dips 1 can a week, Pt not interested in quitting    Substance Use Topics   ??? Alcohol use: Never     Alcohol/week: 0.0 standard drinks     Frequency: Never     Comment: rare use: couple times a year, small amount   ??? Drug use: No         Physical Exam     ED Triage Vitals [09/28/18 1422]   Enc Vitals Group      BP 142/92      Heart Rate 94  SpO2 Pulse       Resp 20      Temp 36.9 ??C (98.4 ??F)      Temp Source Oral      SpO2 95 %     Constitutional: Alert and oriented. Obese habitus, overall appearing. In no distress.  Eyes: Conjunctivae are normal.  ENT       Head: Normocephalic and atraumatic.       Nose: No congestion.       Mouth/Throat: Mucous membranes are moist.       Neck: No stridor.  Hematological/Lymphatic/Immunilogical: No cervical lymphadenopathy.  Cardiovascular: rate as vital signs, regular rhythm. Normal and symmetric distal pulses are present in all extremities.  Respiratory: Normal respiratory effort, symmetrical expansion of chest. No wheezes, no rales.  Gastrointestinal: Soft and nontender. No rigid abdomen present.  Genitourinary: Deferred  Musculoskeletal: No tenderness or edema of bilateral LEs.  Neurologic: Normal speech and language. No gross focal neurologic deficits are appreciated.  Skin: Skin is warm, dry and intact. No rash noted.  Psychiatric: Mood and affect are normal. Speech and behavior are normal.    Radiology     XR Chest Portable   Final Result   No acute airspace disease.          Pertinent labs & imaging results that were available during my care of the patient were reviewed by me and considered in my medical decision making (see chart for details).     Please note- This chart has been created using [employer]. Chart creation errors have been sought, but [place] not always be located and such creation errors, especially pronoun confusion, do NOT reflect on the standard of medical care.     Documentation assistance was provided by Woody Seller, Scribe, on September 28, 2018 at 2:59 PM for Halford Chessman, DO.      I attest that I have reviewed the note and that the components of the history of the present illness, the physical exam, and the assessment and plan documented were performed by me or were performed in my presence by the student where I verified the documentation and performed (or re-performed) the exam and medical decision making.        Dene Gentry, MD  Resident  09/28/18 [phone]

## 2018-09-28 NOTE — Unmapped (Signed)
CCM Intake     PCP Action:None      Pt in ER, CP, elevated Troponin.   [name] asks CCM to call her tomorrow.

## 2018-09-28 NOTE — Unmapped (Signed)
Pt reports left side chest pain and SOB x 30 mins while watching TV. Pt denies any other symptoms

## 2018-09-28 NOTE — Unmapped (Signed)
Care Management  Initial Transition Planning Assessment              Personal Health Advocate Pool contacted by In Basket message regarding post-acute services and collaboration around plan of care.    Home Safety Screening  Patient Questions:    1.  Before the illness or injury that brought you to the ED, did you need someone to help you on a regular basis? Do you need people to help you with activities of daily living?  ?? No  2.  In the last 24 hours, have you needed more help than usual?  ?? No  3.  Have you been hospitalized for one or more nights during the past six months?   ?? Yes-6  4.  Have you been to an emergency room one or more times in the past six months?  ?? Yes-15  5.  In general, do you have problems with your vision that cannot be corrected with glasses/contacts?  ?? No  6.  In general, do you have problems with your memory?  ?? No  7.  Do you take six or more different medications every day?   ?? Yes - 17  8. Do you have any trouble getting, taking, keeping up with or paying for your medications?   ?? No  9   Have you had any falls over the past few weeks or are you concerned about falling?  ?? No  10.  Are you having trouble accessing and/or preparing the meals you need?  ?? No  11. Do you have wounds or bed ulcers?  ?? No  12  Do you have access to transportation?    No    Care Manager Questions:  Is the patient over the age of 62?  ?? No  *Home Safety Evaluation endorsed  4 of the screening items and   will update personal health advocate and pt is currently going to outpatient therapy, and will be without a place to stay after Sunday for about 1 week pt reports, but pt acknowledges he has someone they can stay with short term while they wait for access to a trailor they plan to move into.        General  Care Manager assessed the patient by : Medical record review, Telephone conversation with family, Discussion with Clinical Care team  Orientation Level: Oriented X4  Who provides care at home?: Family member  Reason for referral: Discharge Planning    Contact/[facility]  Extended Emergency Contact Information  Primary Emergency Contact: Wileman,[name]  Address: [address] [place], [place] [address] [place]  Home Phone: [phone]  Relation: Spouse  Secondary Emergency Contact: Pakula,Bridgette   [place]  Home Phone: [phone]  Mobile Phone: [phone]  Relation: Daughter     Type of Residence: Mailing Address:  [address]  [place]  [place] [address]  Contacts: Accompanied by: Alone  Patient Phone Number: [phone]        Medical Provider(s): Kurtis Bushman, MD  Reason for Admission: Admitting Diagnosis:  No admission diagnoses are documented for this encounter.  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  Past Surgical History:   has a past surgical history that includes Knee surgery (Right, 09/23/2006); Cardiac catheterization (03/08/2007); Cystourethroscopy (12/31/2009 ); pr inser hart pacer xvenous atrial (N/A, 05/30/2015); pr ephys eval w/ ablation supravent arrhythmia (N/A, 07/19/2015); pr upper gi endoscopy,biopsy (N/A, 02/28/2016); pr upper gi endoscopy,diagnosis (N/A, 05/07/2016); pr cath place/coron angio, img super/interp,r&l hrt cath, l hrt ventric (N/A, 02/13/2017); pr cath place/coron angio, img super/interp,w left heart ventriculography (N/A, 03/17/2017); pr insert/place flow direct cath (N/A, 03/17/2017); pr ecmo/ecls initiation veno-arterial (N/A, 03/19/2017); pr rebl ves direct,low extrem (Left, 03/19/2017); pr ecmo/ecls rmvl prph cannula open 6 yrs & older (Left, 03/25/2017); pr remv art clot iliac-pop,leg incis (Left, 03/25/2017); pr negative pressure wound therapy dme >50 sq cm (Left, 03/25/2017); pr insj/rplcmt perm dfb w/trnsvns lds 1/dual chmbr (N/A, 05/04/2017); pr prq trluml coronary stent w/angio one art/brnch (N/A, 03/12/2018); and pr cath place/coron angio, img super/interp,w left heart ventriculography (N/A, 05/12/2018).   Previous admit date: 09/21/2018    Primary Insurance- Payor: MEDICARE / Plan: MEDICARE PART A AND PART B / Product Type: *No Product type* /   Secondary Insurance ??? Secondary Insurance  MEDICAID [place]  Prescription Coverage ??? [facility]  Preferred Pharmacy - [facility] - [place], [place] - [address]  [place] [facility]  [facility] - [place] - [address]  [facility] [place] - [address]  [facility] [place] - [address]  [facility]  [facility]  [facility] - [place] ([place]), [place] - [address]    Transportation home: Private vehicle  Level of function prior to admission: Independent          Legal Next of Kin / Guardian / POA / Advance Directives     [name] (HCPOA): Santagata,[name] - [phone]    [name] (HCPOA): Zeoli,Bridgette - Daughter - [phone]    Advance Directive (Medical Treatment) Reason patient does not have an advance directive covering medical treatment:: Patient has been deemed unable to make medical decisions, cannot communicate.  Healthcare Decision Maker: Patient does not wish to appoint a Health Care Decision Maker at this time    Health Care Decision Maker [[name]] (Medical & Mental Health Treatment)  Healthcare Decision Maker: Patient does not wish to appoint a Health Care Decision Maker at this time  Information offered on [name], Medical & Mental Health advance directives:: Patient declined information.    Advance Directive (Mental Health Treatment)  Does patient have an advance directive covering mental health treatment?: Patient does not have advance directive covering mental health treatment.  Reason patient does not have an advance directive covering mental health treatment:: Patient does not wish to complete one at this time.    Patient Information  Lives with: Children, Spouse/significant other    Type of Residence: Private residence        Location/Detail: [address] [place] [address] phone [phone]    Support Systems: Spouse, Children    Responsibilities/Dependents  at home?: No    Home Care services in place prior to admission?: No          Outpatient/Community Resources in place prior to admission: Clinic, DSS  Agency detail (Name/Phone #): Heart track program [facility]; DSS [facility] for food and nutrition services, and PCP is [facility] phone [phone]    Equipment Currently Used at Home: cane, straight, wheelchair, manual, walker, rolling, shower chair  Current HME Agency (Name/Phone #): CPAP from [facility] left at apt that they were evicted from or in storage from previous CM notes    Currently receiving outpatient dialysis?: No       Financial Information       Need for financial assistance?: No  Type of financial assistance required: Community resources    Social Determinants of Health  Social Determinants of Health were addressed in provider documentation.  Please refer to patient history.    Discharge Needs Assessment  Concerns to be Addressed: care coordination/care conferences, homelessness, coping/stress    Clinical Risk Factors: Functional Limitations, Poor Health Literacy, Multiple Diagnoses (Chronic), History of Falls    Barriers to taking medications: No    Prior overnight hospital stay or ED visit in last 90 days: Yes    Readmission Within the Last 30 Days: unable to assess    Patient's Choice of Community Agency(s): recurrence of chest pain    Anticipated Changes Related to Illness: inability to care for self    Equipment Needed After Discharge: none    Discharge Facility/Level of Care Needs: other (see comments)(home with self care, pt will not be in stable housing after Sunday, will be staying with friend or in his car until he and his wife are able to move into a trailor they are going to rent.)    Readmission  Risk of Unplanned Readmission Score:  %  Predictive Model Details   No score data available for [facility] Risk of Unplanned Readmission     Readmitted Within the Last 30 Days? (No if blank)   Patient at risk for readmission?: Yes    Discharge Plan  Screen findings are: Care Manager reviewed the plan of the patient's care with the Multidisciplinary Team. No discharge planning needs identified at this time. Care Manager will continue to manage plan and monitor patient's progress with the team.(pt will not accept home health-not home bound by Medicare guidelines.)    Expected Discharge Date: 09/30/18    Expected Transfer from Critical Care:      Patient and/or family were provided with choice of facilities / services that are available and appropriate to meet post hospital care needs?: N/A(pt reports he will be in between residences for about 1 to 1 1/2 weeks. )       Initial Assessment complete?: Yes

## 2018-09-29 LAB — APTT
Coagulation surface induced:Time:Pt:PPP:Qn:Coag: 51.1 — ABNORMAL HIGH
HEPARIN CORRELATION: 0.2
HEPARIN CORRELATION: 0.3

## 2018-09-29 LAB — BASIC METABOLIC PANEL
ANION GAP: 10 mmol/L (ref 7–15)
BLOOD UREA NITROGEN: 38 mg/dL — ABNORMAL HIGH (ref 7–21)
BUN / CREAT RATIO: 11
CALCIUM: 8.3 mg/dL — ABNORMAL LOW (ref 8.5–10.2)
CHLORIDE: 109 mmol/L — ABNORMAL HIGH (ref 98–107)
CO2: 20 mmol/L — ABNORMAL LOW (ref 22.0–30.0)
CREATININE: 3.59 mg/dL — ABNORMAL HIGH (ref 0.70–1.30)
EGFR CKD-EPI AA MALE: 22 mL/min/{1.73_m2} — ABNORMAL LOW (ref >=60)
EGFR CKD-EPI NON-AA MALE: 19 mL/min/{1.73_m2} — ABNORMAL LOW (ref >=60)
GLUCOSE RANDOM: 179 mg/dL (ref 70–179)
POTASSIUM: 4.5 mmol/L (ref 3.5–5.0)

## 2018-09-29 LAB — HEPARIN CORRELATION: Lab: 0.2

## 2018-09-29 LAB — PHOSPHORUS: Phosphate:MCnc:Pt:Ser/Plas:Qn:: 3.7

## 2018-09-29 LAB — CBC
HEMATOCRIT: 28.1 % — ABNORMAL LOW (ref 41.0–53.0)
MEAN CORPUSCULAR HEMOGLOBIN CONC: 31.3 g/dL (ref 31.0–37.0)
MEAN CORPUSCULAR HEMOGLOBIN: 25.4 pg — ABNORMAL LOW (ref 26.0–34.0)
MEAN CORPUSCULAR VOLUME: 81.2 fL (ref 80.0–100.0)
MEAN PLATELET VOLUME: 8.1 fL (ref 7.0–10.0)
RED BLOOD CELL COUNT: 3.46 10*12/L — ABNORMAL LOW (ref 4.50–5.90)
RED CELL DISTRIBUTION WIDTH: 18.2 % — ABNORMAL HIGH (ref 12.0–15.0)
WBC ADJUSTED: 8.5 10*9/L (ref 4.5–11.0)

## 2018-09-29 LAB — RED BLOOD CELL COUNT: Lab: 3.46 — ABNORMAL LOW

## 2018-09-29 LAB — MAGNESIUM: Magnesium:MCnc:Pt:Ser/Plas:Qn:: 1.7

## 2018-09-29 LAB — ANION GAP: Anion gap 3:SCnc:Pt:Ser/Plas:Qn:: 10

## 2018-09-29 LAB — TROPONIN I: Troponin I.cardiac:MCnc:Pt:Ser/Plas:Qn:: 0.393

## 2018-09-29 MED ORDER — VICTOZA 2-PAK 0.6 MG/0.1 ML (18 MG/3 ML) SUBCUTANEOUS PEN INJECTOR
Freq: Every day | SUBCUTANEOUS | 1 refills | 0.00000 days
Start: 2018-09-29 — End: 2018-09-29

## 2018-09-29 MED FILL — RENVELA 800 MG TABLET: 30 days supply | Qty: 180 | Fill #4

## 2018-09-29 MED FILL — RENVELA 800 MG TABLET: 30 days supply | Qty: 180 | Fill #4 | Status: AC

## 2018-09-29 NOTE — Unmapped (Signed)
CCM Follow-up Call    PCP Action:None     CCM Action:None    Patient Action:call CCM, [facility], Cardiology, inpatient staff for questions and concerns.       Pt's wife states, please tell Dr. Lorin Picket and Dr. Hulan Fess pt will be in the hospital another night.  Pt currently resting, wife states someone mentioned he'll have a procedure, but she is not sure what at this point.    Encouraged wife [name] to speak to inpatient staff re: tx plan.    [name] is currently excited about the possiblity of moving into a trailer within the next few days- when this happens their rent will thankfully  include utilities.      Elevated troponin  Heparin gtt

## 2018-09-29 NOTE — Unmapped (Signed)
Report given to floor. PT's belongings sent with pt. PT is stable and transferred to [facility] via [facility] at this time.

## 2018-09-29 NOTE — Unmapped (Signed)
Missed IM Counseling Visit  Patient wasn't available for appointment.   Jeremy Person, LPN reached him and he said he preferred to reschedule because he is in the hospital.  I called him to touch base directly. [name] said his cell phone was about to die and he asked me to call his room phone [phone].   I tried two times and no one picked up.

## 2018-09-29 NOTE — Unmapped (Signed)
Troponin peaked; on heparin drip, awaiting next aptt; NPO for potential procedure today; voiding; EKG abnormalities this admission, ekg complete x 4 (1 at [facility]) per order; Chest pain on admission, but resolved with nitro; Pt reports increased stress lately s/p vacation at beach and finding and preparing to move to a new location that will not be with daughter; Pt is hopeful that the move away from daughter will decrease his stress load as the relationship or living dynamic with his daughter is stressful and tenuous; Relationship with wife is meaningful and positive; BPs are elevated, started on home meds;

## 2018-09-29 NOTE — Unmapped (Deleted)
INTERNAL MEDICINE COUNSELING SESSION NOTE    LENGTH OF SESSION:  Psychotherapy Session 50 minutes    TYPE OF PSYCHOTHERAPY: Behavioral, Supportive    REASON FOR TREATMENT: Problem Solving Treatment and Mind Body Stress Reduction skill training for??impact of health issues,??stress and self care management; link impact of effort to mood.        Patient participated in visit from ***  Patient indicated that:   ***   they felt safe in their setting to engage in phone counseling visit.    I spent *** minutes on the {phone audio video visit:67489} with the patient. I spent an additional *** minutes on pre- and post-visit activities.     The patient was physically located in [place] or a state in which I am permitted to provide care. The patient and/or parent/guardian understood that s/he Bar incur co-pays and cost sharing, and agreed to the telemedicine visit. The visit was reasonable and appropriate under the circumstances given the patient's presentation at the time.    The patient and/or parent/guardian has been advised of the potential risks and limitations of this mode of treatment (including, but not limited to, the absence of in-person examination) and has agreed to be treated using telemedicine. The patient's/patient's family's questions regarding telemedicine have been answered.     If the visit was completed in an ambulatory setting, the patient and/or parent/guardian has also been advised to contact their provider???s office for worsening conditions, and seek emergency medical treatment and/or call 911 if the patient deems either necessary.             REVIEW OF FUNCTIONING SINCE LAST VISIT    MOOD (0-10) =  {MOOD:23791}    {NUMBERS 1-10:18281}  of 10 with 10 being the best. At last visit was   5    SATISFACTION WITH EFFORT IN CARING FOR MOOD (0-10) =    {NUMBERS 1-10:18281} of 10 with 10 being the best.  At last visit was   8    PAIN:     {HH 0-10:109015}   At last visit was    2    PHQ-9 SCORE: {NUMBERS 1-31:20750}  At last visit was  6    GAD-7 SCORE:     {NUMBERS 1-31:20750}  At last visit was    8      CURRENT SUICIDAL/HOMICIDAL IDEATION:   {Blank multiple:29302::Denies SI / HI,Endorses thoughts of death.,Denies plan or intent to self-harm. Denies HI.,Denies HI,Presentation insufficient for IVC. Mitigating factors:  }      PROBLEM LIST:    Patient Active Problem List   Diagnosis   ??? CAD (coronary artery disease) ([name])   ??? Idiopathic chronic gout of left knee   ??? Hypercholesteremia   ??? Essential hypertension ([name])   ??? 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 Osteoarthritis   ??? Chronic nonalcoholic liver disease   ??? GERD (gastroesophageal reflux disease)   ??? Mild intermittent asthma without complication   ??? Chronic pain syndrome   ??? Paroxysmal atrial fibrillation (CMS-[name])   ??? OSA (obstructive sleep apnea)   ??? Tobacco use disorder   ??? Status post cardiac pacemaker procedure   ??? Diabetes mellitus with gastroparesis (CMS-[name])   ??? Cardiac pacemaker in situ   ??? Recurrent major depressive disorder, in full remission (CMS-[name])   ??? NSTEMI (non-ST elevated myocardial infarction) (CMS-[name])   ??? Adjustment disorder, unspecified   ??? Chronic heart  failure with reduced ejection fraction and diastolic dysfunction (CMS-[name])   ??? Hyperkalemia   ??? CKD (chronic kidney disease) stage 4, GFR 15-29 ml/min (CMS-[name])   ??? Acquired left foot drop   ??? Decreased sensation of lower extremity, left   ??? Chronic anticoagulation   ??? Type 2 diabetes mellitus with stage 4 chronic kidney disease, with long-term current use of insulin (CMS-[name])         MEDICATION COMPLIANCE:     Taking all medications regularly?  {yes/no:310494}     Any changes in medication since last visit?  {yes/no:310494}     Any missed doses? {yes/no:310494}       Current Facility-Administered Medications on File Prior to Visit   Medication Dose Route Frequency Provider Last Rate Last Dose   ??? acetaminophen (TYLENOL) tablet 650 mg  650 mg Oral Q8H PRN Rafat Mahmood, MD   650 mg at 09/28/18 2349   ??? albuterol 2.5 mg /3 mL (0.083 %) nebulizer solution 2.5 mg  2.5 mg Nebulization Q6H PRN Rafat Mahmood, MD       ??? allopurinoL (ZYLOPRIM) tablet 50 mg  50 mg Oral Daily Tamera Reason, MD       ??? amiodarone (PACERONE) tablet 200 mg  200 mg Oral Daily Rafat Mahmood, MD       ??? aspirin chewable tablet 81 mg  81 mg Oral Daily Azucena Freed, MD       ??? [COMPLETED] aspirin tablet 325 mg  325 mg Oral Once Dene Gentry, MD   325 mg at 09/28/18 1622   ??? atorvastatin (LIPITOR) tablet 80 mg  80 mg Oral Daily Rafat Mahmood, MD       ??? bumetanide (BUMEX) tablet 2 mg  2 mg Oral Daily Rafat Mahmood, MD       ??? carvediloL (COREG) tablet 25 mg  25 mg Oral BID Tamera Reason, MD   25 mg at 09/28/18 2349   ??? cholecalciferol (vitamin D3) tablet 5,000 Units  5,000 Units Oral Daily Rafat Mahmood, MD       ??? dextrose 50 % in water (D50W) 50 % solution 12.5 g  12.5 g Intravenous Q10 Min PRN Azucena Freed, MD       ??? ezetimibe (ZETIA) tablet 10 mg  10 mg Oral Nightly Rafat Mahmood, MD   10 mg at 09/29/18 0009   ??? FLUoxetine (PROzac) capsule 80 mg  80 mg Oral Daily Tamera Reason, MD       ??? gabapentin (NEURONTIN) capsule 300 mg  300 mg Oral BID Rafat Mahmood, MD       ??? heparin (porcine) 1000 unit/mL injection 2,000 Units  2,000 Units Intravenous Q6H PRN Rafat Mahmood, MD   2,000 Units at 09/29/18 0416   ??? [COMPLETED] heparin (porcine) 1000 unit/mL injection 4,000 Units  4,000 Units Intravenous Once Tamera Reason, MD   4,000 Units at 09/28/18 2005   ??? heparin 25,000 Units/250 mL (100 units/mL) in 0.45% saline infusion (premade)  12 Units/kg/hr Intravenous Continuous Rafat Mahmood, MD 20.07 mL/hr at 09/29/18 0416 13 Units/kg/hr at 09/29/18 0416   ??? insulin glargine (LANTUS) injection 25 Units  25 Units Subcutaneous Nightly Rafat Mahmood, MD       ??? [COMPLETED] insulin glargine (LANTUS) injection 25 Units  25 Units Subcutaneous Once Azucena Freed, MD   25 Units at 09/29/18 0128   ??? insulin lispro (HumaLOG) injection 0-12 Units  0-12 Units Subcutaneous ACHS Azucena Freed, MD   2 Units at 09/29/18  1610   ??? insulin lispro (HumaLOG) injection 10 Units  10 Units Subcutaneous TID AC Azucena Freed, MD   10 Units at 09/29/18 0007   ??? isosorbide mononitrate (IMDUR) 24 hr tablet 90 mg  90 mg Oral Daily Rafat Mahmood, MD       ??? nitroglycerin (NITROSTAT) SL tablet 0.4 mg  0.4 mg Sublingual Q5 Min PRN Azucena Freed, MD   0.4 mg at 09/28/18 2125   ??? ondansetron (ZOFRAN) tablet 4 mg  4 mg Oral Q8H PRN Rafat Mahmood, MD       ??? pantoprazole (PROTONIX) EC tablet 20 mg  20 mg Oral Daily Rafat Mahmood, MD       ??? sevelamer (RENVELA) tablet 1,600 mg  1,600 mg Oral 3xd Meals Rafat Mahmood, MD       ??? ticagrelor (BRILINTA) tablet 90 mg  90 mg Oral BID Tamera Reason, MD   90 mg at 09/28/18 2349     Current Outpatient Medications on File Prior to Visit   Medication Sig Dispense Refill   ??? acetaminophen (TYLENOL) 500 MG tablet TAKE 2 TABLETS (1 000MG ) BY MOUTH EVERY 8 HOURS AS NEEDED FOR PAIN 180 each 3   ??? albuterol HFA 90 mcg/actuation inhaler Inhale 2 puffs into the lungs every 6 (six) hours as needed for wheezing or shortness of breath. 18 g 2   ??? alirocumab (PRALUENT) 150 mg/mL subcutaneous injection Inject 1 mL (150 mg total) under the skin every fourteen (14) days. 4 Syringe 11   ??? allopurinoL (ZYLOPRIM) 100 MG tablet Take 0.5 tablets (50 mg total) by mouth daily. 45 tablet 3   ??? amiodarone (PACERONE) 200 MG tablet TAKE 1 TABLET BY MOUTH ONCE DAILY 90 tablet 3   ??? arm brace Misc 1 each by Miscellaneous route daily. 1 each 0   ??? atorvastatin (LIPITOR) 80 MG tablet Take 1 tablet (80 mg total) by mouth daily. 90 tablet 3   ??? blood sugar diagnostic Strp USE TO CHECK BLOOD SUGAR 4 TIMES DAILY (BEFORE MEALS AND NIGHTLY) 100 each 3   ??? blood-glucose meter kit Use to check blood sugars, #1 meter, #100 strips. Freestyle glucose meter. ICD-9 250.00 1 each 0   ??? bumetanide (BUMEX) 2 MG tablet Take 1 tablet (2 mg total) by mouth daily. 90 tablet 3   ??? carvediloL (COREG) 25 MG tablet TAKE 1 TABLET BY MOUTH WITH THE MORNING MEAL AND TAKE 2 TABLETS WITH THE EVENING MEAL 270 tablet 3   ??? cholecalciferol, vitamin D3, 5,000 unit capsule Take 1 capsule (5,000 Units total) by mouth daily. 100 capsule 0   ??? diclofenac sodium (VOLTAREN) 1 % gel Apply 2 grams topically Three (3) times a day. 100 g 11   ??? empty container Misc Use as directed to dispose of injectable medications 1 each 3   ??? ezetimibe (ZETIA) 10 mg tablet TAKE 1 TABLET BY MOUTH ONCE DAILY 90 each 3   ??? FLUoxetine (PROZAC) 40 MG capsule Take 2 capsules (80 mg total) by mouth daily. 180 capsule 3   ??? gabapentin (NEURONTIN) 300 MG capsule Take 1 capsule (300 mg total) by mouth two (2) times a day. 180 capsule 3   ??? insulin ASPART (NOVOLOG FLEXPEN U-100 INSULIN) 100 unit/mL (3 mL) injection pen Inject 0.2 mL (20 Units total) under the skin three (3) times a day with a meal. 15 mL 11   ??? insulin detemir U-100 (LEVEMIR) 100 unit/mL (3 mL) injection pen Inject 0.4 mL (40 Units  total) under the skin nightly. 15 mL 3   ??? insulin syringe-needle U-100 0.3 mL 31 gauge x 5/16 Syrg Use to inject insulin twice daily with meals 60 each 0   ??? isosorbide mononitrate (IMDUR) 30 MG 24 hr tablet Take 2 tablets (60 mg total) by mouth daily. 60 tablet 0   ??? melatonin 3 mg cap Take 9 mg by mouth nightly.     ??? nitroglycerin (NITROSTAT) 0.4 MG SL tablet Place 1 tablet (0.4 mg total) under the tongue every five (5) minutes as needed for chest pain. 100 each 3   ??? omeprazole (PRILOSEC) 20 MG capsule Take 1 capsule (20 mg total) by mouth daily. 90 capsule 3   ??? ondansetron (ZOFRAN) 4 MG tablet Take 1 tablet (4 mg total) by mouth every eight (8) hours as needed for nausea for up to 4 days. 10 tablet 0   ??? oxyCODONE-acetaminophen (PERCOCET) 5-325 mg per tablet Take 1 tablet by mouth every eight (8) hours as needed. (Patient not taking: Reported on 08/12/2018) 12 tablet 0   ??? QUEtiapine (SEROQUEL) 50 MG tablet Take 1 tablet (50 mg total) by mouth two (2) times a day as needed (headache). 180 tablet 3   ??? rivaroxaban (XARELTO) 15 mg Tab Take 1 tablet (15 mg total) by mouth daily with evening meal. 90 tablet 3   ??? semaglutide 0.25 mg or 0.5 mg(2 mg/1.5 mL) PnIj Inject 0.5 mg under the skin every seven (7) days. 4.5 mL 3   ??? sevelamer (RENVELA) 800 mg tablet TAKE 2 TABLETS (1600MG ) BY MOUTH THREE TIMES DAILY WITH MEALS 540 tablet 3   ??? sodium polystyrene, SPS, with sorbitol (SPS, WITH SORBITOL,) 15-20 gram/60 mL Susp Take 60 mL (15 g) by mouth once a week. 473 mL 3   ??? ticagrelor (BRILINTA) 90 mg Tab TAKE 1 TABLET BY MOUTH TWICE DAILY 180 tablet 3   ??? UNIFINE PENTIPS 31 gauge x 5/16 Ndle USE WITH INSULIN AND VICTOZA 5 TIMES DAILY 400 each 3   ??? [DISCONTINUED] traZODone (DESYREL) 50 MG tablet Take 1 tablet (50 mg total) by mouth nightly. 90 tablet 3           SLEEPING:    ***          APPETITE:    ***          SUBSTANCE USE:  {yes/no:310494}         SESSION FOCUS:  Addressed:  No diagnosis found.    Discussed efforts since last contact.    ***                    Today's Problem Solving addressed:    Reviewed patient's efforts and linked impact of effort to No diagnosis found.    Addressed:    ***        Discussed issues presented and options for continued and/or improved self care management         Today's Mind Body Stress Reduction Skill Training addressed:        Additional:     Medication adherence and barriers to the treatment plan have been addressed. Opportunities to optimize healthy behaviors have been discussed. Patient / caregiver voiced understanding.          ASSESSMENT      ENGAGEMENT: Patient presented a willingness to participate in treatment.  PARTICIPATION QUALITY:    {ddparticipationqualities:22407}  MOOD:  {ZOXW:96045}     AFFECT:  {RRAFFECT:24213}   MENTAL STATUS:     {  Mental status:20198}  MODES OF INTERVENTION used in this session:    {Blank multiple:29302::Advocacy,Assessment,Clarification,Cognitive Behavioral,Education,Exploration,Linkage facilitation,Mind Body Stress Reduction Skill Training,Motivational Interviewing,Problem Solving,Support,Reality Testing,Reframing,Week review,Treatment review}      TREATMENT PLAN    Identified treatment goals:????improve self care management, develop / improve stress management skills or healthy sleep hygiene  ??  Patient-stated health goal:????support    Problem Solving Treatment and Mind Body Stress Reduction Skill Training:  Next visit will be   {NUMBERS 1-10:18281} of up to 12 visits.    RTC in {NUMBERS 1-10:18281} weeks.

## 2018-09-29 NOTE — Unmapped (Signed)
Bedside Report given to CAC

## 2018-09-29 NOTE — Unmapped (Signed)
Cardiology Admission H&P    PCP: Kurtis Bushman, MD   Cardiologist: Dr. Eppie Gibson   Source of Information: Patient, EMR  Code Status: Full code, Confirmed on admission      ASSESSMENT/PLAN:     Active Problems:    NSTEMI (non-ST elevated myocardial infarction) (CMS-[name])      Jeremy Oliver is a 48  y.o. 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 He has a complex cardiac history salient for many PCI who presented to [facility] w/ chest pain admitted for NSTEMI.     Chest pain - CAD s/p multiple PCIs: History of extensive CAD with multiple stents. Had an STEMI in 2018 with VT arrest (s/p ICD) with DES to mLAD. NSTEMI on 03/12/2018 and had 2x DES placed in??the mid RCA and RPLA/RPDA bifurcation.??Another NSTEMI in February 2020 w/??PCI to proximal/mid LAD.  He is now representing with chest pain and T wave inversions in lateral leads.  Mildly elevated troponin II 0.28-->0.40-->0.38. EKG in ED and further in admission without ST changes but with new T wave inversions V4-V6 when compared to prior EKG one week ago.  Fortunately patient is a very poor candidate for repeat left heart cath given his advanced CKD and likelihood of progression to dialysis. Patient strongly states he would not like to go through with dialysis. We will start medical management and continue to monitor serial troponin/ekg but unlikely that he will need repeat LHC   -- Monitor on telemetry and with serial EKGs  -- Cycle cardiac biomarkers   -- s/p ASA 325 on arrival  -- Atorvastatin 80mg  PO daily  -- Continue Home Coreg   -- Ticagrelor 90mg  BID   -- optimize blood pressure control  -- Consult nephrology if plan to proceed with heart cath  -- Start heparin gtt, ACS protocol    HFrEF: 2/2 ischemic cardiomyopathy.TTE 05/2018 showed EF of 40-45% with segmental wall motion abnormalities in the distribution of the anterior descending artery, unchanged from the previous study of 2/13/202.  -- Home carvedilol   -- Bumex 2mg  daily   ??  Paroxysmal Afib: NSR on EKG in ED.   -- heparin ggt  -- Rate control with carvedilol   -- Amiodarone 200mg  daily     HTN: BP elevated upon arrival, good response to prn Nitro given accompanied with chest pain  - Home Coreg  - Home Imdur, consider increasing  ??  T2DM: Poorly controlled with most recent A1c 9.5. Insulin dependent at home.   -- Lantus 25 QHS   -- SSI   -- ACHS Glucose checks   ??  CKD Stage IV: Highly variable baseline but more recently appears to be in the 3.5-4.0 range. 3.58 on admission today.   -- Avoid nephrotoxins   -- dose reduced gabapentin  -- Trend creatinine   -- Monitor I's and O's   -- Home sevelamer TID w/ meals   ??  HLD: As above. On Praluent injections at home.   Gout: allopurinol 50mg  daily   GERD: pantoprazole for home omeprazole     FEN/GI/PPX:   -- NPO tonight   -- Monitor lytes and replace as needed.   -- Heparin gtt and famotidine    *Dispo: Admit to Cardiology         History of Present Illness     Chief Complaint: Chest Pain    Jeremy Oliver  is a 48  y.o. 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 He has a complex cardiac history salient for many PCI who presented to [facility] w/ chest pain     He reported sudden onset chest pressure without radiation that he attributes to getting too hot. He recently went on a day trip to the beach and was on his feet a bit resulting in leg swelling bilaterally, which is common for him and has actually improved back to his baseline from earlier on his beach trip a few days ago. Timing of chest pain occurred after getting into a verbal argument with his son in law. He states he has had a lot of anxiety and stressors in his life lately including moving and family arguments. Noticed improvement with Nitro. He denies any associated diaphoresis, shortness of breath, or nausea at onset of symptoms.  He recently had an increase in his long acting nitrate but no other recent med changes. He has had no fevers, chills, cough, shortness of breath, no hemoptysis, no DVT/PE history, and no nausea, vomiting, or diarrhea.     EKG w/ new T wave inversions in V4 V5 V6.  Troponin was elevated at 0.28.  Patient was given an aspirin 325mg  and started on heparin drip. Upon arrival to [facility] his blood pressure was 210/180s and he was complaining of chest pain and headache. He was given Nitro with relief of symptoms and brought his chest pain to a 1/10 and blood pressure lowered to 160s systolics. A repeat EKG was done at the time that looked overall the same as the prior EKG when presented to the ED. Afterwards he was able to get some rest.     ??    Cardiovascular History/Procedures     Risk Factors:  advanced age (older than 65 for men, 88 for women), hypertension, HLD, tobacco use, male gender      Cardiovascular History & Procedures:  ??  LHC  05/12/18  1. Coronary artery disease including diffuse moderate to severe lesions in the left coronary system, with particular emphasis on the 90% proximal to mid LAD in-stent restenosis.  2. Successful PCI to the proximal to mid LAD with the placement of a Synergy drug eluting stent with excellent angiographic result and TIMI 3 flow.  3. Subsequent impaired flow at an adjacent moderate diagonal, which was small and had pre-existing ostial disease. Recrossed and treated with balloon angioplasty??with restored flow.  4. Recent stent to RPDA/RPLA bifurcation widely patent.  ??  CV Surgery:  ?? ??None  ??  EP Procedures and Devices:  ?? Dual chamber pacemaker upgraded to dual chamber ICD 05/2017  ??  Non-Invasive Evaluation(s):  ?? Echo 06/02/2018  ?? Mildly decreased global left ventricular systolic function (EF 40-45%), with segmental wall motion abnormalities in the distribution of the anterior descending artery, unchanged from the previous study of 05/13/2018.  ?? Normal right ventricular systolic function.  ?? No gross structural valvular abnormalities.  ??    Allergies   Losartan and Morphine    Medications     No current facility-administered medications on file prior to encounter.      Current Outpatient Medications on File Prior to Encounter   Medication Sig   ??? acetaminophen (TYLENOL) 500 MG tablet TAKE 2 TABLETS (1 000MG ) BY MOUTH EVERY 8 HOURS AS NEEDED FOR PAIN   ??? albuterol HFA 90 mcg/actuation  inhaler Inhale 2 puffs into the lungs every 6 (six) hours as needed for wheezing or shortness of breath.   ??? alirocumab (PRALUENT) 150 mg/mL subcutaneous injection Inject 1 mL (150 mg total) under the skin every fourteen (14) days.   ??? allopurinoL (ZYLOPRIM) 100 MG tablet Take 0.5 tablets (50 mg total) by mouth daily.   ??? amiodarone (PACERONE) 200 MG tablet TAKE 1 TABLET BY MOUTH ONCE DAILY   ??? arm brace Misc 1 each by Miscellaneous route daily.   ??? atorvastatin (LIPITOR) 80 MG tablet Take 1 tablet (80 mg total) by mouth daily.   ??? blood sugar diagnostic Strp USE TO CHECK BLOOD SUGAR 4 TIMES DAILY (BEFORE MEALS AND NIGHTLY)   ??? blood-glucose meter kit Use to check blood sugars, #1 meter, #100 strips. Freestyle glucose meter. ICD-9 250.00   ??? bumetanide (BUMEX) 2 MG tablet Take 1 tablet (2 mg total) by mouth daily.   ??? carvediloL (COREG) 25 MG tablet TAKE 1 TABLET BY MOUTH WITH THE MORNING MEAL AND TAKE 2 TABLETS WITH THE EVENING MEAL   ??? cholecalciferol, vitamin D3, 5,000 unit capsule Take 1 capsule (5,000 Units total) by mouth daily.   ??? diclofenac sodium (VOLTAREN) 1 % gel Apply 2 grams topically Three (3) times a day.   ??? empty container Misc Use as directed to dispose of injectable medications   ??? ezetimibe (ZETIA) 10 mg tablet TAKE 1 TABLET BY MOUTH ONCE DAILY   ??? FLUoxetine (PROZAC) 40 MG capsule Take 2 capsules (80 mg total) by mouth daily.   ??? gabapentin (NEURONTIN) 300 MG capsule Take 1 capsule (300 mg total) by mouth two (2) times a day.   ??? insulin ASPART (NOVOLOG FLEXPEN U-100 INSULIN) 100 unit/mL (3 mL) injection pen Inject 0.2 mL (20 Units total) under the skin three (3) times a day with a meal.   ??? insulin detemir U-100 (LEVEMIR) 100 unit/mL (3 mL) injection pen Inject 0.4 mL (40 Units total) under the skin nightly.   ??? insulin syringe-needle U-100 0.3 mL 31 gauge x 5/16 Syrg Use to inject insulin twice daily with meals   ??? isosorbide mononitrate (IMDUR) 30 MG 24 hr tablet Take 2 tablets (60 mg total) by mouth daily.   ??? melatonin 3 mg cap Take 9 mg by mouth nightly.   ??? nitroglycerin (NITROSTAT) 0.4 MG SL tablet Place 1 tablet (0.4 mg total) under the tongue every five (5) minutes as needed for chest pain.   ??? omeprazole (PRILOSEC) 20 MG capsule Take 1 capsule (20 mg total) by mouth daily.   ??? ondansetron (ZOFRAN) 4 MG tablet Take 1 tablet (4 mg total) by mouth every eight (8) hours as needed for nausea for up to 4 days.   ??? oxyCODONE-acetaminophen (PERCOCET) 5-325 mg per tablet Take 1 tablet by mouth every eight (8) hours as needed. (Patient not taking: Reported on 08/12/2018)   ??? QUEtiapine (SEROQUEL) 50 MG tablet Take 1 tablet (50 mg total) by mouth two (2) times a day as needed (headache).   ??? rivaroxaban (XARELTO) 15 mg Tab Take 1 tablet (15 mg total) by mouth daily with evening meal.   ??? semaglutide 0.25 mg or 0.5 mg(2 mg/1.5 mL) PnIj Inject 0.5 mg under the skin every seven (7) days.   ??? sevelamer (RENVELA) 800 mg tablet TAKE 2 TABLETS (1600MG ) BY MOUTH THREE TIMES DAILY WITH MEALS   ??? sodium polystyrene, SPS, with sorbitol (SPS, WITH SORBITOL,) 15-20 gram/60 mL Susp Take 60 mL (15 g) by  mouth once a week.   ??? ticagrelor (BRILINTA) 90 mg Tab TAKE 1 TABLET BY MOUTH TWICE DAILY   ??? UNIFINE PENTIPS 31 gauge x 5/16 Ndle USE WITH INSULIN AND VICTOZA 5 TIMES DAILY   ??? [DISCONTINUED] traZODone (DESYREL) 50 MG tablet Take 1 tablet (50 mg total) by mouth nightly.       Past Medical History     Past Medical History:   Diagnosis Date   ??? Angina at rest (CMS-[name])    ??? Arthritis    ??? Asthma    ??? Atrial fibrillation and flutter (CMS-[name]) 06/08/14   ??? BMI 40.0-44.9, adult (CMS-[name]) 10/29/2015   ??? Chronic anticoagulation 02/17/2018   ??? Chronic kidney disease, stage 3 (CMS-[name])    ??? Chronic pain syndrome 04/11/2014    ~Use daily lyrica as recommended ~Tramadol as needed for pain ~Fluoxetine daily as recommended ~Daily exercise ~Eat a healthy diet ~Good control of your blood sugars can help    ??? Coronary artery disease    ??? Depression    ??? Diabetes mellitus (CMS-[name])    ??? Eye trauma 1998    glass in both eyes and removed   ??? GERD (gastroesophageal reflux disease)    ??? Gout    ??? Homeless    ??? Hyperlipidemia    ??? Hypertension    ??? Illiteracy and low-level literacy    ??? Microscopic hematuria    ??? Migraine    ??? Nephrolithiasis    ??? 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 Nonalcoholic fatty liver disease    ??? NSTEMI (non-ST elevated myocardial infarction) (CMS-[name]) 01/13/2017   ??? Obesity    ??? Obstructive sleep apnea 2009   ??? Panic attacks    ??? 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 Seizure-like activity (CMS-[name])     ~Monitor for symptoms and report them to your primary care provider if they occur    ??? Systolic CHF, chronic (CMS-[name])     EF 40-45% 2017   ??? TIA (transient ischemic attack)        Past Surgical History     Past Surgical History:   Procedure Laterality Date   ??? CARDIAC CATHETERIZATION  03/08/2007   ??? CYSTOURETHROSCOPY  12/31/2009     Microscopic hematuria   ??? KNEE SURGERY Right 09/23/2006    arthroscopic knee surgery medial and lateral meniscus tears as well as crystal disease   ??? PR CATH PLACE/CORON ANGIO, IMG SUPER/INTERP,R&L HRT CATH, L HRT VENTRIC N/A 02/13/2017    Procedure: Left/Right Heart Catheterization;  Surgeon: Marlaine Hind, MD;  Location: [facility] CATH;  Service: Cardiology   ??? PR CATH PLACE/CORON ANGIO, IMG SUPER/INTERP,W LEFT HEART VENTRICULOGRAPHY N/A 03/17/2017    Procedure: Left Heart Catheterization W Intervention;  Surgeon: Marlaine Hind, MD;  Location: [facility] CATH;  Service: Cardiology   ??? PR CATH PLACE/CORON ANGIO, IMG SUPER/INTERP,W LEFT HEART VENTRICULOGRAPHY N/A 05/12/2018    Procedure: CATH LEFT HEART CATHETERIZATION W INTERVENTION;  Surgeon: Dorathy Kinsman, MD;  Location: [facility] CATH;  Service: Cardiology   ??? PR ECMO/ECLS INITIATION VENO-ARTERIAL N/A 03/19/2017  Procedure: ECMO/ECLS; INITIATION, VENO-ARTERIAL;  Surgeon: Lennie Odor, MD;  Location: MAIN OR [facility];  Service: Cardiac Surgery   ??? PR ECMO/ECLS RMVL PRPH CANNULA OPEN 6 YRS & OLDER Left 03/25/2017    Procedure: ECMO / ECLS PROVIDED BY PHYSICIAN; REMOVAL OF PERIPHERAL (ARTERIAL AND/OR VENOUS) CANNULA(E), OPEN, 6 YEARS AND OLDER;  Surgeon: Alonna Buckler Ikonomidis, MD;  Location: MAIN OR [facility];  Service: Cardiac Surgery   ??? PR EPHYS EVAL W/ ABLATION SUPRAVENT ARRHYTHMIA N/A 07/19/2015    Catheter ablation of cavotricuspid isthmus for atrial flutter [facility])   ??? PR INSER HART PACER XVENOUS ATRIAL N/A 05/30/2015    [employer] dual-chamber pacemaker implant (E.Chung)   ??? PR INSERT/PLACE FLOW DIRECT CATH N/A 03/17/2017    Procedure: Insert Leave In [place];  Surgeon: Marlaine Hind, MD;  Location: [facility] CATH;  Service: Cardiology   ??? PR INSJ/RPLCMT PERM DFB W/TRNSVNS LDS 1/DUAL CHMBR N/A 05/04/2017    Procedure: ICD Implant System (Single/Dual);  Surgeon: Eldred Manges, MD;  Location: [facility] CATH;  Service: Cardiology   ??? PR NEGATIVE PRESSURE WOUND THERAPY DME >50 SQ CM Left 03/25/2017    Procedure: Neg Press Wound Tx (Vac Assist) Incl Topicals, Per Session, Tsa Greater Than/= 50 Cm Squared; Surgeon: Alonna Buckler Ikonomidis, MD;  Location: [facility] OR [facility];  Service: Cardiac Surgery   ??? PR PRQ TRLUML CORONARY STENT W/ANGIO ONE ART/BRNCH N/A 03/12/2018    Procedure: Percutaneous Coronary Intervention;  Surgeon: Marlaine Hind, MD;  Location: [facility] CATH;  Service: Cardiology   ??? PR REBL VES DIRECT,LOW EXTREM Left 03/19/2017    Procedure: Repr Bld Vessel Direct; Lower Extrem;  Surgeon: Boykin Reaper, MD;  Location: MAIN OR [facility];  Service: Vascular   ??? PR REMV ART CLOT ILIAC-POP,LEG INCIS Left 03/25/2017    Procedure: EMBOLECTOMY OR THROMBECTOMY, WITH OR WITHOUT CATHETER; FEMOROPOPLITEAL, AORTOILIAC ARTERY, BY LEG INCISION;  Surgeon: Alonna Buckler Ikonomidis, MD;  Location: MAIN OR [facility];  Service: Cardiac Surgery   ??? PR UPPER GI ENDOSCOPY,BIOPSY N/A 02/28/2016    Procedure: UGI ENDOSCOPY; WITH BIOPSY, SINGLE OR MULTIPLE;  Surgeon: Liane Comber, MD;  [facility]: [facility];  Service: Gastroenterology   ??? PR UPPER GI ENDOSCOPY,DIAGNOSIS N/A 05/07/2016    Procedure: UGI ENDO, INCLUDE ESOPHAGUS, STOMACH, & DUODENUM &/OR JEJUNUM; DX W/WO COLLECTION SPECIMN, BY BRUSH OR WASH;  Surgeon: Modena Nunnery, MD;  [facility]: [facility];  Service: Gastroenterology       Social History   Tobacco use:   reports that he has never smoked. His smokeless tobacco use includes chew.  Alcohol use:   reports no history of alcohol use.  Drug use:  reports no history of drug use.  Living situation: the patient lives with their family    Family History   The patient's family history includes Basal cell carcinoma in his mother; Blindness in his mother; Diabetes in his mother; Gallbladder disease in his half-brother; Heart disease in his father, half-sister, and mother; Hyperlipidemia in his father and mother; Kidney disease in his half-sister; Lung disease in his father; No Known Problems in his daughter and son; Obesity in his daughter, half-brother, and mother..    Review of Systems: 12-point ROS negative except as noted in HPI    OBJECTIVE DATA     VITALS  Temp:  [36.9 ??C] 36.9 ??C  Heart Rate:  [80-94] 80  Resp:  [20] 20  BP: (142-167)/(79-95) 167/79  SpO2:  [95 %-99 %] 98 %  Physical Exam   General: appears distressed and anxious, uncomfortable, non toxic  HEENT: PERRL, EOMI, no scleral icterus, OP clear without lesions or exudates  Heart: Regular rate and rhythm, no murmurs  Lungs: clear to auscultation bilaterally, normal work of breathing, no wheezing or crackles  Abdomen: soft, nontender, non-distended, obese  Skin: normal color, no rashes or lesions  Msk: normal muscle tone and bulk  Psych: AAOx3, anxious    Labs:  Results for orders placed or performed during the hospital encounter of 09/28/18   Troponin I   Result Value Ref Range    Troponin I 0.280 (HH) <0.060 ng/mL   Comprehensive Metabolic Panel   Result Value Ref Range    Sodium 138 135 - 145 mmol/L    Potassium 4.8 3.5 - 5.0 mmol/L    Chloride 108 (H) 98 - 107 mmol/L    Anion Gap 8 7 - 15 mmol/L    CO2 22.0 22.0 - 30.0 mmol/L    BUN 37 (H) 7 - 21 mg/dL    Creatinine 2.95 (H) 0.70 - 1.30 mg/dL    BUN/Creatinine Ratio 10     EGFR CKD-EPI Non-African American, Male 18 (L) >=60 mL/min/1.73m2    EGFR CKD-EPI African American, Male 21 (L) >=60 mL/min/1.25m2    Glucose 315 (H) 70 - 179 mg/dL    Calcium 8.8 8.5 - 62.1 mg/dL    Albumin 3.2 (L) 3.5 - 5.0 g/dL    Total Protein 6.5 6.5 - 8.3 g/dL    Total Bilirubin 0.1 0.0 - 1.2 mg/dL    AST 15 (L) 19 - 55 U/L    ALT 11 <50 U/L    Alkaline Phosphatase 73 38 - 126 U/L   Magnesium Level   Result Value Ref Range    Magnesium 1.7 1.6 - 2.2 mg/dL   PT-INR   Result Value Ref Range    PT 11.5 10.2 - 13.1 sec    INR 1.00    APTT   Result Value Ref Range    APTT 33.3 25.9 - 39.5 sec    Heparin Correlation 0.2    ECG 12 lead (Adult)   Result Value Ref Range    EKG Systolic BP  mmHg    EKG Diastolic BP  mmHg    EKG Ventricular Rate 94 BPM    EKG Atrial Rate 94 BPM    EKG P-R Interval 188 ms EKG QRS Duration 96 ms    EKG Q-T Interval 392 ms    EKG QTC Calculation 490 ms    EKG Calculated P Axis 52 degrees    EKG Calculated R Axis -6 degrees    EKG Calculated T Axis 132 degrees    QTC Fredericia 455 ms   CBC w/ Differential   Result Value Ref Range    WBC 7.3 4.5 - 11.0 10*9/L    RBC 3.74 (L) 4.50 - 5.90 10*12/L    HGB 9.6 (L) 13.5 - 17.5 g/dL    HCT 30.8 (L) 65.7 - 53.0 %    MCV 80.6 80.0 - 100.0 fL    MCH 25.7 (L) 26.0 - 34.0 pg    MCHC 31.9 31.0 - 37.0 g/dL    RDW 84.6 (H) 96.2 - 15.0 %    MPV 6.9 (L) 7.0 - 10.0 fL    Platelet 337 150 - 440 10*9/L    Neutrophils % 70.2 %    Lymphocytes % 19.2 %    Monocytes % 4.1 %    Eosinophils %  3.3 %    Basophils % 0.7 %    Absolute Neutrophils 5.2 2.0 - 7.5 10*9/L    Absolute Lymphocytes 1.4 (L) 1.5 - 5.0 10*9/L    Absolute Monocytes 0.3 0.2 - 0.8 10*9/L    Absolute Eosinophils 0.2 0.0 - 0.4 10*9/L    Absolute Basophils 0.1 0.0 - 0.1 10*9/L    Large Unstained Cells 3 0 - 4 %    Microcytosis Slight (A) Not Present    Anisocytosis Slight (A) Not Present    Hypochromasia Slight (A) Not Present         Lab History:  Lab Results   Component Value Date    PRO-BNP 3,440.0 (H) 09/21/2018    PRO-BNP 3,080.0 (H) 07/22/2018    PRO-BNP 4,560.0 (H) 07/02/2018    PRO-BNP 231 (H) 06/08/2014    Creatinine 3.69 (H) 09/28/2018    Creatinine 3.74 (H) 09/22/2018    Creatinine 1.30 06/09/2014    Creatinine 1.56 (H) 06/08/2014    BUN 37 (H) 09/28/2018    BUN 33 (H) 09/22/2018    BUN 24 (H) 06/09/2014    BUN 28 (H) 06/08/2014    Sodium 138 09/28/2018    Sodium Whole Blood 132 (L) 03/25/2017    Potassium 4.8 09/28/2018    Potassium, Bld 5.5 (H) 03/25/2017    CO2 22.0 09/28/2018    CO2 23 06/09/2014    Magnesium 1.7 09/28/2018    Magnesium 2.0 06/09/2014    Total Bilirubin 0.1 09/28/2018    Total Bilirubin 0.8 06/08/2014    INR, POC 1.2 02/12/2018    INR 1.00 09/28/2018       No results found for: DIGOXIN    Lab Results   Component Value Date    TSH 4.832 (H) 07/02/2018    TSH 2.29 06/08/2014    Triglycerides 248 (H) 08/12/2018    Triglycerides 400 (H) 08/30/2012    HDL 28 (L) 08/12/2018    HDL 35 (L) 08/30/2012    Non-HDL Cholesterol 155 08/12/2018    LDL Calculated 105 (H) 08/12/2018    LDL Direct 77.8 08/17/2018    LDL Direct 239 08/30/2012    LDL Cholesterol, Calculated 264 08/30/2012

## 2018-09-29 NOTE — Unmapped (Addendum)
Jeremy Oliver is a??48????y.o. 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      Jeremy Oliver is a??48????y.o. 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 He has a complex cardiac history salient for many PCIs. He is currently chest pain free. In light of his poor renal function, opted for medical management: trended troponins, heparinized, and monitored with serial EKGs. Troponin peaked at 0.536.  ??  Concern for VF - resolved  History of pseudoseizures  EMS concerned in transport that patient had an episode of VF. Presumed event in ambulance was a pseudoseizure since??device??was??interrogated by EP nurse and cardiology fellow: no??discharges or dysrhythmias.  ??  Chest pain??- CAD??s/p multiple PCIs:??History of extensive CAD with multiple stents. Had an STEMI in 2018 with VT arrest (s/p ICD) with DES to mLAD. Presented with NSTEMI on 03/12/2018 and had 2x DES placed in??the mid RCA and RPLA/RPDA bifurcation.??Another NSTEMI in February??2020??w/??PCI to proximal/mid LAD. Patient presented again in 06/2018 with chest pain associated with persistent abdominal pain and was found to have NSTEMI (Type 1 vs Type 2). LDL elevated in 5/20 at 105. Hemoglobin A1c 9.5 in 06/2018. EKG w/ TWI in lateral leads unchanged from prior. Initial troponin 0.150.

## 2018-09-30 LAB — CBC
HEMATOCRIT: 28 % — ABNORMAL LOW (ref 41.0–53.0)
HEMOGLOBIN: 8.7 g/dL — ABNORMAL LOW (ref 13.5–17.5)
MEAN CORPUSCULAR HEMOGLOBIN CONC: 31 g/dL (ref 31.0–37.0)
MEAN CORPUSCULAR HEMOGLOBIN: 25.2 pg — ABNORMAL LOW (ref 26.0–34.0)
MEAN CORPUSCULAR VOLUME: 81.2 fL (ref 80.0–100.0)
PLATELET COUNT: 322 10*9/L (ref 150–440)
RED CELL DISTRIBUTION WIDTH: 18.4 % — ABNORMAL HIGH (ref 12.0–15.0)
WBC ADJUSTED: 9.5 10*9/L (ref 4.5–11.0)

## 2018-09-30 LAB — BASIC METABOLIC PANEL
ANION GAP: 9 mmol/L (ref 7–15)
BLOOD UREA NITROGEN: 37 mg/dL — ABNORMAL HIGH (ref 7–21)
BUN / CREAT RATIO: 10
CALCIUM: 8.6 mg/dL (ref 8.5–10.2)
CHLORIDE: 106 mmol/L (ref 98–107)
CO2: 21 mmol/L — ABNORMAL LOW (ref 22.0–30.0)
CREATININE: 3.61 mg/dL — ABNORMAL HIGH (ref 0.70–1.30)
EGFR CKD-EPI AA MALE: 21 mL/min/{1.73_m2} — ABNORMAL LOW (ref >=60)
EGFR CKD-EPI NON-AA MALE: 19 mL/min/{1.73_m2} — ABNORMAL LOW (ref >=60)
GLUCOSE RANDOM: 175 mg/dL (ref 70–179)
POTASSIUM: 4.8 mmol/L (ref 3.5–5.0)
SODIUM: 136 mmol/L (ref 135–145)

## 2018-09-30 LAB — APTT: APTT: 39 s (ref 25.9–39.5)

## 2018-09-30 LAB — PHOSPHORUS: Phosphate:MCnc:Pt:Ser/Plas:Qn:: 4.2

## 2018-09-30 LAB — MAGNESIUM: Magnesium:MCnc:Pt:Ser/Plas:Qn:: 1.7

## 2018-09-30 LAB — HEPARIN CORRELATION: Lab: 0.2

## 2018-09-30 LAB — EGFR CKD-EPI AA MALE: Lab: 21 — ABNORMAL LOW

## 2018-09-30 LAB — PLATELET COUNT: Platelets:NCnc:Pt:Bld:Qn:Automated count: 322

## 2018-09-30 MED ORDER — ISOSORBIDE MONONITRATE ER 30 MG TABLET,EXTENDED RELEASE 24 HR
ORAL_TABLET | Freq: Every day | ORAL | 0 refills | 0.00000 days | Status: SS
Start: 2018-09-30 — End: 2018-10-19

## 2018-09-30 MED FILL — AMLODIPINE 10 MG TABLET: 90 days supply | Qty: 90 | Fill #0 | Status: AC

## 2018-09-30 NOTE — Unmapped (Signed)
Alert, oriented, afebrile, denies pain, denies SOB. Heparin gtt infusing without complications. Trazodone given for sleep. Telemetry monitoring in place. Bed alarm active and falls precautions observed. Medications administered as ordered per MAR. Patient has remained free from falls and injury this shift. Continuing with plan of care.       Problem: Adult Inpatient Plan of Care  Goal: Plan of Care Review  Outcome: Progressing  Flowsheets (Taken 09/30/2018 0351)  Progress: improving  Plan of Care Reviewed With: patient  Goal: Patient-Specific Goal (Individualization)  Outcome: Progressing  Goal: Absence of Hospital-Acquired Illness or Injury  Outcome: Progressing  Goal: Optimal Comfort and Wellbeing  Outcome: Progressing  Goal: Readiness for Transition of Care  Outcome: Progressing  Goal: Rounds/Family Conference  Outcome: Progressing     Problem: Fall Injury Risk  Goal: Absence of Fall and Fall-Related Injury  Outcome: Progressing     Problem: Glycemic Control Impaired  Goal: Blood Glucose Level Within Desired Range  Outcome: Progressing     Problem: Adjustment to Illness (Heart Failure)  Goal: Optimal Coping  Outcome: Progressing     Problem: Arrhythmia/Dysrhythmia (Heart Failure)  Goal: Stable Heart Rate and Rhythm  Outcome: Progressing     Problem: Cardiac Output Decreased (Heart Failure)  Goal: Optimal Cardiac Output  Outcome: Progressing     Problem: Fluid Imbalance (Heart Failure)  Goal: Fluid Balance  Outcome: Progressing     Problem: Functional Ability Impaired (Heart Failure)  Goal: Optimal Functional Ability  Outcome: Progressing     Problem: Oral Intake Inadequate (Heart Failure)  Goal: Optimal Nutrition Intake  Outcome: Progressing     Problem: Respiratory Compromise (Heart Failure)  Goal: Effective Oxygenation and Ventilation  Outcome: Progressing     Problem: Sleep Disordered Breathing (Heart Failure)  Goal: Effective Breathing Pattern During Sleep  Outcome: Progressing     Problem: Adjustment to Illness (Acute Coronary Syndrome)  Goal: Optimal Adaptation to Illness  Outcome: Progressing     Problem: Arrhythmia (Acute Coronary Syndrome)  Goal: Normalized Cardiac Rhythm  Outcome: Progressing     Problem: Pain (Acute Coronary Syndrome)  Goal: Absence of Cardiac-Related Pain  Outcome: Progressing     Problem: Hemodynamic Instability (Acute Coronary Syndrome)  Goal: Effective Cardiac Pump Function  Outcome: Progressing     Problem: Tissue Perfusion (Acute Coronary Syndrome)  Goal: Adequate Tissue Perfusion  Outcome: Progressing     Problem: Asthma Comorbidity  Goal: Maintenance of Asthma Control  Outcome: Progressing     Problem: Diabetes Comorbidity  Goal: Blood Glucose Level Within Desired Range  Outcome: Progressing     Problem: Heart Failure Comorbidity  Goal: Maintenance of Heart Failure Symptom Control  Outcome: Progressing     Problem: Hypertension Comorbidity  Goal: Blood Pressure in Desired Range  Outcome: Progressing     Problem: Obstructive Sleep Apnea Risk or Actual (Comorbidity Management)  Goal: Unobstructed Breathing During Sleep  Outcome: Progressing

## 2018-09-30 NOTE — Unmapped (Signed)
Alert and oriented 4x up with walker to bathroom. Heparin per ACS nomogram maintained. D/C tomorrow, cardiac medication adjustment in progress. SR with 1st AVB on tele with slightly elevated BP. Pt states they live with daughter; son in law noted to scream to patient on the phone this pm causing some emotional stress. No complaint of chest pain or shortness of breath this shift. Wife in earlier, updated on pt condition.   Problem: Adult Inpatient Plan of Care  Goal: Plan of Care Review  Outcome: Progressing  Goal: Patient-Specific Goal (Individualization)  Outcome: Progressing  Goal: Absence of Hospital-Acquired Illness or Injury  Outcome: Progressing  Goal: Optimal Comfort and Wellbeing  Outcome: Progressing  Goal: Readiness for Transition of Care  Outcome: Progressing  Goal: Rounds/Family Conference  Outcome: Progressing     Problem: Fall Injury Risk  Goal: Absence of Fall and Fall-Related Injury  Outcome: Progressing     Problem: Glycemic Control Impaired  Goal: Blood Glucose Level Within Desired Range  Outcome: Progressing     Problem: Cardiac Output Decreased (Heart Failure)  Goal: Optimal Cardiac Output  Outcome: Progressing

## 2018-09-30 NOTE — Unmapped (Signed)
I was the supervising physician in the delivery of the service. Lyfe Reihl D Nazanin Kinner, MD

## 2018-10-01 MED ORDER — AMLODIPINE 10 MG TABLET
ORAL_TABLET | Freq: Every day | ORAL | 3 refills | 0 days | Status: CP
Start: 2018-10-01 — End: 2018-11-17
  Filled 2018-09-30: qty 90, 90d supply, fill #0

## 2018-10-01 NOTE — Unmapped (Signed)
Physician Discharge Summary [facility]  3 [address] [facility]  [address]  [place] [address]  Dept: [phone]  Loc: [phone]     Identifying Information:   Jeremy Oliver  11/26/1970  [id number]    Primary Care Physician: Kurtis Bushman, MD   Code Status: Full Code    Admit Date: 09/28/2018    Discharge Date: 09/30/2018     Discharge To: Home    Discharge Service: CARD [name] 2     Discharge Attending Physician: No att. providers found    Discharge Diagnoses:  Active Problems:    NSTEMI (non-ST elevated myocardial infarction) (CMS-[name]) POA: Unknown  Resolved Problems:    * No resolved hospital problems. *      Outpatient Provider Follow Up Issues:   [ ]  Consider ranolizine for anti-anginal (Does have prolonged QTc)   [ ]  Consider switching to Metoprolol from Carvedilol  [ ]  Consider restarting hydralazine if patient agreeable  [ ]  Consider starting Victoza for diabetes  [ ]        Hospital Course: Jeremy Oliver is a??48????y.o. 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 He has a complex cardiac history salient for many PCI who presented to [facility] w/ chest pain admitted for NSTEMI and transferred to [name] main. Ultimately discharged after medical management.  ??  NSTEMI - CAD??s/p multiple PCIs:??History of extensive CAD with multiple stents. Had an STEMI in 2018 with VT arrest (s/p ICD) with DES to mLAD. Also has DES to mid RCA RPLA/RPDA bifurcation.??Last LHC ??2020??w/??PCI to proximal/mid LAD. He is now representing with chest pain. Mildly elevated troponin II 0.28-->0.40-->0.38. EKG in ED and further in admission without ST changes but with new T wave inversions V4-V6 when compared to prior EKG one week ago. Unfortunately patient is a very poor candidate for repeat left heart cath given his advanced CKD and likelihood of progression to dialysis. Patient strongly states he would not like to go through with dialysis. He was started on heparin gtt and montiored for 36 hrs.   -- Atorvastatin 80mg  PO daily  -- Continue Home Coreg 25 qAM, 50 qPM (appears to have been on this dose for a long time, despite this being not a common dose)  -- Ticagrelor 90mg  BID   -- C/W on Xarelto 15 qD   -- Increased IMDUR to 90mg   -- Started on Amlodipine 10mg  qD (BP elevated to SBP in 180s/90s)    ??   HFrEF: 2/2 ischemic cardiomyopathy.TTE 05/2018 showed EF of 40-45%??with segmental wall motion abnormalities in the distribution of the anterior descending artery, unchanged from the previous study of 2/13/202.  -- Home carvedilol   -- Bumex 2mg  daily   -- Unable to take ACEi/ARB or spiro with renal function  ??  Paroxysmal Afib:??NSR on EKG in ED.   -- heparin ggt   -- Rate control with carvedilol   -- Amiodarone 200mg  daily    Hx of VT s/p ICD  - Home amio 200 daily ??     HTN: BP elevated upon arrival, good response to prn Nitro given accompanied with chest pain   - Home Coreg  - Increased Imdur to 90mg  qD, Restarted on Amlodipine 10mg       Touchbase with Outpatient Provider:  Warm Handoff: Completed on 09/30/18 by Tamera Reason  (Intern) via Phone    Procedures:  None  No admission procedures  for hospital encounter.  ______________________________________________________________________  Discharge Medications:     Your Medication List      STOP taking these medications    melatonin 3 mg Cap     oxyCODONE-acetaminophen 5-325 mg per tablet  Commonly known as:  PERCOCET        START taking these medications    amLODIPine 10 MG tablet  Commonly known as:  NORVASC  Take 1 tablet (10 mg total) by mouth daily.  Start taking on:  October 01, 2018        CHANGE how you take these medications    isosorbide mononitrate 30 MG 24 hr tablet  Commonly known as:  IMDUR  Take 3 tablets (90 mg total) by mouth daily.  What changed:  how much to take        CONTINUE taking these medications    acetaminophen 500 MG tablet  Commonly known as:  TYLENOL  TAKE 2 TABLETS (1 000MG ) BY MOUTH EVERY 8 HOURS AS NEEDED FOR PAIN     allopurinoL 100 MG tablet  Commonly known as:  ZYLOPRIM  Take 0.5 tablets (50 mg total) by mouth daily.     amiodarone 200 MG tablet  Commonly known as:  PACERONE  TAKE 1 TABLET BY MOUTH ONCE DAILY     arm brace Misc  1 each by Miscellaneous route daily.     atorvastatin 80 MG tablet  Commonly known as:  LIPITOR  Take 1 tablet (80 mg total) by mouth daily.     blood sugar diagnostic Strp  USE TO CHECK BLOOD SUGAR 4 TIMES DAILY (BEFORE MEALS AND NIGHTLY)     blood-glucose meter kit  Use to check blood sugars, #1 meter, #100 strips. Freestyle glucose meter. ICD-9 250.00     BRILINTA 90 mg Tab  Generic drug:  ticagrelor  TAKE 1 TABLET BY MOUTH TWICE DAILY     bumetanide 2 MG tablet  Commonly known as:  BUMEX  Take 1 tablet (2 mg total) by mouth daily.     carvediloL 25 MG tablet  Commonly known as:  COREG  TAKE 1 TABLET BY MOUTH WITH THE MORNING MEAL AND TAKE 2 TABLETS WITH THE EVENING MEAL     cholecalciferol (vitamin D3) 5,000 unit capsule  Take 1 capsule (5,000 Units total) by mouth daily.     diclofenac sodium 1 % gel  Commonly known as:  VOLTAREN  Apply 2 grams topically Three (3) times a day.     empty container Misc  Use as directed to dispose of injectable medications     ezetimibe 10 mg tablet  Commonly known as:  ZETIA  TAKE 1 TABLET BY MOUTH ONCE DAILY     FLUoxetine 40 MG capsule  Commonly known as:  PROzac  Take 2 capsules (80 mg total) by mouth daily.     gabapentin 300 MG capsule  Commonly known as:  NEURONTIN  Take 1 capsule (300 mg total) by mouth two (2) times a day.     insulin syringe-needle U-100 0.3 mL 31 gauge x 5/16 (8 mm) Syrg  Use to inject insulin twice daily with meals     LEVEMIR FLEXTOUCH U-100 INSULN 100 unit/mL (3 mL) injection pen  Generic drug:  insulin detemir U-100  Inject 0.4 mL (40 Units total) under the skin nightly.     nitroglycerin 0.4 MG SL tablet  Commonly known as:  NITROSTAT  Place 1 tablet (0.4 mg total) under the tongue every five (5) minutes as  needed for chest pain.     NovoLOG Flexpen U-100 Insulin 100 unit/mL (3 mL) injection pen  Generic drug:  insulin ASPART  Inject 0.2 mL (20 Units total) under the skin three (3) times a day with a meal.     omeprazole 20 MG capsule  Commonly known as:  PriLOSEC  Take 1 capsule (20 mg total) by mouth daily.     ondansetron 4 MG tablet  Commonly known as:  ZOFRAN  Take 1 tablet (4 mg total) by mouth every eight (8) hours as needed for nausea for up to 4 days.     OZEMPIC 0.25 mg or 0.5 mg(2 mg/1.5 mL) Pnij  Generic drug:  semaglutide  Inject 0.5 mg under the skin every seven (7) days.     PRALUENT PEN 150 mg/mL subcutaneous injection  Generic drug:  alirocumab  Inject 1 mL (150 mg total) under the skin every fourteen (14) days.     QUEtiapine 50 MG tablet  Commonly known as:  SEROqueL  Take 1 tablet (50 mg total) by mouth two (2) times a day as needed (headache).     RENVELA 800 mg tablet  Generic drug:  sevelamer  TAKE 2 TABLETS (1600MG ) BY MOUTH THREE TIMES DAILY WITH MEALS     SPS (WITH SORBITOL) 15-20 gram/60 mL Susp  Generic drug:  sodium polystyrene (SPS) with sorbitol  Take 60 mL (15 g) by mouth once a week.     UNIFINE PENTIPS 31 gauge x 5/16 (8 mm) Ndle  Generic drug:  pen needle, diabetic  USE WITH INSULIN AND VICTOZA 5 TIMES DAILY     VENTOLIN HFA 90 mcg/actuation inhaler  Generic drug:  albuterol  Inhale 2 puffs into the lungs every 6 (six) hours as needed for wheezing or shortness of breath.     XARELTO 15 mg Tab  Generic drug:  rivaroxaban  Take 1 tablet (15 mg total) by mouth daily with evening meal.            Allergies:  Losartan and Morphine  ______________________________________________________________________  Pending Test Results (if blank, then none):      Most Recent Labs:  Microbiology -   Microbiology Results (last day)     ** No results found for the last 24 hours. **        CBC - Results in Past 2 Days  Result Component Current Result   WBC 9.5 (09/30/2018)   RBC 3.45 (L) (09/30/2018)   HGB 8.7 (L) (09/30/2018)   HCT 28.0 (L) (09/30/2018)   MCV 81.2 (09/30/2018)   MCH 25.2 (L) (09/30/2018)   MCHC 31.0 (09/30/2018)   MPV 8.6 (09/30/2018)   Platelet 322 (09/30/2018)     BMP - Results in Past 2 Days  Result Component Current Result   Sodium 136 (09/30/2018)   Potassium 4.8 (09/30/2018)   Chloride 106 (09/30/2018)   CO2 21.0 (L) (09/30/2018)   BUN 37 (H) (09/30/2018)   Creatinine 3.61 (H) (09/30/2018)   EST.GFR (MDRD) Not in Time Range   Glucose 175 (09/30/2018)     Coagulation - Results in Past 2 Days  Result Component Current Result   PT Not in Time Range   INR, POC Not in Time Range   APTT 39.0 (09/30/2018)     Cardiac markers -   No results found for requested labs within last 2 days.     ABGs-   No results found for requested labs within last 2 days.     LFT's -  No results found for requested labs within last 2 days.     Toxicology screen -   No results found for requested labs within last 2 days.   [      Relevant Studies/Radiology (if blank, then none):  Xr Chest Portable    Result Date: 09/28/2018  EXAM: XR CHEST PORTABLE DATE: 09/28/2018 at 1533 hours ACCESSION: [id number] UN DICTATED: 09/28/2018 3:55 PM INTERPRETATION LOCATION: Main Campus CLINICAL INDICATION: 48 years old Male with CHEST PAIN  COMPARISON: Chest x-ray from 09/21/2018 TECHNIQUE: Portable Chest Radiograph. FINDINGS: Left chest wall pacemaker/AICD. Mildly hyperinflated lungs. No consolidation. No pleural effusion or pneumothorax Stable cardiomediastinal silhouette. Remote left rib fractures.     No acute airspace disease.    ______________________________________________________________________  Discharge Instructions:           Other Instructions     Discharge instructions      You were admitted to the hospital for chest pain. You were treated for a small heart attack.   We did adjust your Imdur to 90 mg daily (please take this as prescribed.).  We also started you on a medication called loaded pain 10 mg daily.  Please take all of your medications as prescribed.    It is extremely important that you take Brilinta and Xarelto as prescribed every day.    Referred you to physical therapy, please continue physical therapy outpatient.    Please bring all of your medications with you to your next doctor's visit                    Follow Up instructions and Outpatient Referrals     Discharge instructions      Physical Therapy            Appointments which have been scheduled for you    Oct 07, 2018  8:50 AM EDT  (Arrive by 8:35 AM)  Danelle Earthly RETURN with Marlaine Hind, MD  [facility] HEART VASCULAR CTR [address] [facility] REGION) [address]  [address]  [place] [address]  [phone]      Oct 08, 2018  8:40 AM EDT  (Arrive by 8:25 AM)  RETURN TELEPHONE with Roma Schanz, MD  [facility] INTERNAL MEDICINE [facility] REGION) [address] [place] [address]  [phone]      Oct 22, 2018  1:30 PM EDT  (Arrive by 1:15 PM)  RETURN  RHEUMATOLOGY with Monica Martinez, MD  [facility] RHEUMATOLOGY [address] [place] [facility] REGION) [phone] [place] [address]  [phone]      Oct 26, 2018  8:15 AM EDT  (Arrive by 7:45 AM)  REMOTE ICD FOLLOW-UP with [facility] EP REMOTE MONITORING  [facility] EP REMOTE MONITORING [place] [facility] REGION) [address]  [address] [place] [address]  [phone]   This is a remote appointment, you do not need to come into the office.     Nov 18, 2018  1:30 PM EDT  (Arrive by 1:15 PM)  Danelle Earthly RETURN with Marlaine Hind, MD  [facility] HEART VASCULAR CTR [address]  [facility] REGION) [address]  [place] [address]  [phone]      Dec 07, 2018  1:30 PM EDT  (Arrive by 1:15 PM)  RETURN  GENERAL with Mila Merry, MD  [place] NEPHROLOGY [facility]) [address]  [place] [address]  [phone]      Jan 25, 2019  8:15 AM EDT  (Arrive by 7:45 AM)  REMOTE ICD FOLLOW-UP with [facility] EP REMOTE MONITORING  [facility] EP REMOTE MONITORING [facility] REGION) [address]  [address] [place] [address]  [phone]   This is a remote appointment, you do not need to come into the office.     Apr 26, 2019  8:15 AM EST  (Arrive by 7:45 AM)  REMOTE ICD FOLLOW-UP with [facility] EP REMOTE MONITORING  [facility] EP REMOTE MONITORING [facility] REGION) [address]  [address] [place] [address]  [phone]   This is a remote appointment, you do not need to come into the office.     Jul 26, 2019  8:15 AM EDT  (Arrive by 7:45 AM)  REMOTE ICD FOLLOW-UP with [facility] EP REMOTE MONITORING  [facility] EP REMOTE MONITORING  [facility] REGION) [address]  [address] [place] [address]  [phone]   This is a remote appointment, you do not need to come into the office.          ______________________________________________________________________  Discharge Day Services:  BP 146/73  - Pulse 70  - Temp 36.4 ??C (Oral)  - Resp 18  - Ht 190.5 cm (6' 3)  - Wt (!) 155.7 kg (343 lb 3.2 oz)  - SpO2 95%  - BMI 42.90 kg/m??   Pt seen on the day of discharge and determined appropriate for discharge.    Condition at Discharge: good    Length of Discharge: I spent greater than 30 mins in the discharge of this patient.

## 2018-10-03 NOTE — Unmapped (Signed)
On 09-22-18 I personally spent 15 minutes in the discharge process of this patient.

## 2018-10-04 MED ORDER — ALBUTEROL SULFATE HFA 90 MCG/ACTUATION AEROSOL INHALER
RESPIRATORY_TRACT | 2 refills | 0 days | Status: CP
Start: 2018-10-04 — End: ?
  Filled 2018-10-06: qty 18, 25d supply, fill #0

## 2018-10-04 MED FILL — EZETIMIBE 10 MG TABLET: ORAL | 90 days supply | Qty: 90 | Fill #2

## 2018-10-04 MED FILL — DICLOFENAC 1 % TOPICAL GEL: 17 days supply | Qty: 100 | Fill #3 | Status: AC

## 2018-10-04 MED FILL — SPS (WITH SORBITOL) 15 GRAM-20 GRAM/60 ML ORAL SUSPENSION: ORAL | 60 days supply | Qty: 473 | Fill #0

## 2018-10-04 MED FILL — CARVEDILOL 25 MG TABLET: 90 days supply | Qty: 270 | Fill #2

## 2018-10-05 ENCOUNTER — Telehealth: Admit: 2018-10-05 | Discharge: 2018-10-06 | Payer: MEDICARE | Attending: Clinical | Primary: Clinical

## 2018-10-05 DIAGNOSIS — F439 Reaction to severe stress, unspecified: Secondary | ICD-10-CM

## 2018-10-05 DIAGNOSIS — F4320 Adjustment disorder, unspecified: Principal | ICD-10-CM

## 2018-10-05 MED FILL — OZEMPIC 0.25 MG OR 0.5 MG (2 MG/1.5 ML) SUBCUTANEOUS PEN INJECTOR: 56 days supply | Qty: 3 | Fill #1 | Status: AC

## 2018-10-05 NOTE — Unmapped (Signed)
General Consent to Treat (GCT) for non-epic video visits only: Verbal consent    [name] reviewed and updated in Epic:  Completed or Not completed (Reason complete) pt states to have his daughter taken off That it should only be his spouse on his [name]      PHQ9  Thoughts that you would be better off dead, or of hurting yourself in some way: Not at all  PHQ-9 TOTAL SCORE: 10    GAD7  GAD-7 Total Score: 18    Physical address reviewed: Pt states he is not staying at the address on file. He is currently living  In his car.

## 2018-10-05 NOTE — Unmapped (Signed)
CCM Follow-up Call    PCP Action:None     CCM Action:f/u SW     Pt's wife called in states, [name] had to take a nitro last night. Just one and it worked.  Pt's wife concerned about pt's mental health b/c daughter, Clarisse Gouge, is blocking them from their grandson, [facility].  Clarisse Gouge and [name] have always had difficulties getting along, however this is new to [name], per Francisca December and Clarisse Gouge have always been very close.    Will notify scheduling and SW (Diane) re:  Possible telemedicine today.  [name] states to call pt's phone.
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
## 2018-10-06 MED FILL — DICLOFENAC 1 % TOPICAL GEL: TOPICAL | 17 days supply | Qty: 100 | Fill #3

## 2018-10-06 MED FILL — OZEMPIC 0.25 MG OR 0.5 MG (2 MG/1.5 ML) SUBCUTANEOUS PEN INJECTOR: SUBCUTANEOUS | 56 days supply | Qty: 3 | Fill #1

## 2018-10-06 MED FILL — SPS (WITH SORBITOL) 15 GRAM-20 GRAM/60 ML ORAL SUSPENSION: 60 days supply | Qty: 473 | Fill #0 | Status: AC

## 2018-10-06 MED FILL — ACETAMINOPHEN 500 MG TABLET: 30 days supply | Qty: 180 | Fill #3

## 2018-10-06 MED FILL — NOVOLOG FLEXPEN U-100 INSULIN ASPART 100 UNIT/ML (3 ML) SUBCUTANEOUS: SUBCUTANEOUS | 25 days supply | Qty: 15 | Fill #1

## 2018-10-06 MED FILL — ALLOPURINOL 100 MG TABLET: 90 days supply | Qty: 45 | Fill #1 | Status: AC

## 2018-10-06 MED FILL — ISOSORBIDE MONONITRATE ER 30 MG TABLET,EXTENDED RELEASE 24 HR: 30 days supply | Qty: 90 | Fill #0 | Status: AC

## 2018-10-06 MED FILL — CARVEDILOL 25 MG TABLET: 90 days supply | Qty: 270 | Fill #2 | Status: AC

## 2018-10-06 MED FILL — NITROGLYCERIN 0.4 MG SUBLINGUAL TABLET: 28 days supply | Qty: 100 | Fill #2 | Status: AC

## 2018-10-06 MED FILL — VENTOLIN HFA 90 MCG/ACTUATION AEROSOL INHALER: 25 days supply | Qty: 18 | Fill #0 | Status: AC

## 2018-10-06 MED FILL — ALLOPURINOL 100 MG TABLET: ORAL | 90 days supply | Qty: 45 | Fill #1

## 2018-10-06 MED FILL — ACETAMINOPHEN 500 MG TABLET: 30 days supply | Qty: 180 | Fill #3 | Status: AC

## 2018-10-06 MED FILL — EZETIMIBE 10 MG TABLET: 90 days supply | Qty: 90 | Fill #2 | Status: AC

## 2018-10-06 MED FILL — NITROGLYCERIN 0.4 MG SUBLINGUAL TABLET: SUBLINGUAL | 28 days supply | Qty: 100 | Fill #2

## 2018-10-06 MED FILL — ISOSORBIDE MONONITRATE ER 30 MG TABLET,EXTENDED RELEASE 24 HR: ORAL | 30 days supply | Qty: 90 | Fill #0

## 2018-10-06 MED FILL — NOVOLOG FLEXPEN U-100 INSULIN ASPART 100 UNIT/ML (3 ML) SUBCUTANEOUS: 25 days supply | Qty: 15 | Fill #1 | Status: AC

## 2018-10-07 NOTE — Unmapped (Signed)
Patient came late and he had to leave early, he has another appointment in [place], he cancelled today's appointment.  Requesting a call from Dr Andrey Farmer.

## 2018-10-08 ENCOUNTER — Telehealth: Admit: 2018-10-08 | Discharge: 2018-10-09 | Payer: MEDICARE

## 2018-10-08 DIAGNOSIS — Z794 Long term (current) use of insulin: Secondary | ICD-10-CM

## 2018-10-08 DIAGNOSIS — N184 Chronic kidney disease, stage 4 (severe): Secondary | ICD-10-CM

## 2018-10-08 DIAGNOSIS — Z09 Encounter for follow-up examination after completed treatment for conditions other than malignant neoplasm: Principal | ICD-10-CM

## 2018-10-08 DIAGNOSIS — Z1211 Encounter for screening for malignant neoplasm of colon: Secondary | ICD-10-CM

## 2018-10-08 DIAGNOSIS — R2242 Localized swelling, mass and lump, left lower limb: Secondary | ICD-10-CM

## 2018-10-08 DIAGNOSIS — E1122 Type 2 diabetes mellitus with diabetic chronic kidney disease: Secondary | ICD-10-CM

## 2018-10-08 NOTE — Unmapped (Signed)
Patient reported his pain level is 5/10 and it is in his legs. Patient's wife unable to provide BS levels. She stated his readings were in the 150s.     Previsit complete    Time spent on phone: 5 mins    Reason for visit: Hosp F/U    Questions / Concerns that need to be addressed: see note above    General Consent to Treat (GCT) for non-epic video visits only: Verbal consent    Allergies reviewed: Yes    Medication reviewed: Yes    Travel Screening: not applicable    Falls Risk: not applicable    [place] (DV) Screening: not applicable    [name] reviewed and updated in Epic:  Completed    PHQ9  Thoughts that you would be better off dead, or of hurting yourself in some way: Not at all  PHQ-9 TOTAL SCORE: 8

## 2018-10-08 NOTE — Unmapped (Signed)
Internal Medicine Video Visit    This visit is conducted via video conferencing.    Contact Information  Person Contacted: Patient  Contact Phone number: [phone] (home)   Is there someone else in the room? Yes. What is your relationship? wife. Do you want this person here for the visit? yes.  Patient agreed to a video visit    [name] is a 48 y.o. male  participating in a video visit.    Reason for visit:  Hospital follow-up    Subjective:  CHIEF COMPLAINT: Hospital Follow-Up for chest pain  Dates of Hospitalization:  6/23 - 09/22/18 and 6/30- 09/30/18  Reviewed: Discharge summary and Labs  Discussed care with: Inpatient team  Interactive Contact by phone or other method (Encounter type: Patient Outreach): Successful contact made or 2 unsuccessful attempts within 2 business days and No contact made or no documentation of 2 attempts within 2 business days  History obtained from: Caregiver and Patient          Two recent hospitalizations for chest pain and noted to have NSTEMI.  Given high risk for cath did not undergo Cath.    Since hospitalization has had one episode of chest pain that was not nearly as bad.  Occurred when he was out walking and also got in argument with Bridgette, his daughter.  Since hasn't had daughter and son-in-law around, has been much less stressful and not having as much chest pain.      Doesn't have BP cuff at home.  Hasn't filled amlodipine yet, but asks if should take with other BP meds.     No issues with Ozempic. Thought he was told it was going off the market.     Left foot swelling.  None in the morning, worsens over the day.  Hasn't used compression stockings before.     I have reviewed the problem list, medications, and allergies and have updated/reconciled them if needed.    Objective:  General:  NAD, comfortable appearing  Resp:  No tachypnea    Assessment & Plan:  1. Type 2 diabetes mellitus with stage 4 chronic kidney disease, with long-term current use of insulin (CMS-[name]) 2. Hospital discharge follow-up    3. Localized swelling of left foot    4. Screen for colon cancer        1. Hospital discharge follow-up  - Ambulatory referral to Internal Medicine    2. Type 2 diabetes mellitus with stage 4 chronic kidney disease, with long-term current use of insulin (CMS-[name])  Lab Results   Component Value Date    A1C 9.5 (H) 07/02/2018   Recent BG on random labs at ED visits and hospitalizations have been much improved since starting ozempic.   - Levemir 40 units qd  - novolog 20 units TID  - Hemoglobin A1c; Future - will get drawn at [facility] hospital    3. Localized swelling of left foot  In foot that has experienced prior trauma relate do prior ECMO s/b foot drop.  If worsening advised SDC.  In person visit for subsequent visit to re-evaluate.  - Compression stockings    4. Screen for colon cancer  - Immunochemical Fecal Occult Blood Test (FIT), automated; Future      Return in about 4 weeks (around 11/05/2018).           I spent 23 minutes on the audio/video with the patient. I spent an additional 6 minutes on pre- and post-visit activities.     The  patient was physically located in [place] or a state in which I am permitted to provide care. The patient and/or parent/guardian understood that s/he [place] incur co-pays and cost sharing, and agreed to the telemedicine visit. The visit was reasonable and appropriate under the circumstances given the patient's presentation at the time.    The patient and/or parent/guardian has been advised of the potential risks and limitations of this mode of treatment (including, but not limited to, the absence of in-person examination) and has agreed to be treated using telemedicine. The patient's/patient's family's questions regarding telemedicine have been answered.     If the visit was completed in an ambulatory setting, the patient and/or parent/guardian has also been advised to contact their provider???s office for worsening conditions, and seek emergency medical treatment and/or call 911 if the patient deems either necessary.

## 2018-10-10 NOTE — Unmapped (Signed)
[facility] [place]  OUTPATIENT PHYSICAL THERAPY  10/13/2018           Patient Name: Jeremy Oliver  Date of Birth:03-25-1971  Diagnosis: No diagnosis found.  Referring MD:  Orrin Brigham, MD     Plan of Care Effective Date:       Assessment & Plan               Personal Factors/Comorbidities: 3+  Specific Comorbidities: CAD, HTN, OSA, T2DM, CKD Stage IV, and TIA                            Subjective     History of Present Illness          Reason for Referral/Chief Complaint: Multi-site OA                                          Objective                            I attest that I have reviewed the above information.  Signed: Francoise Schaumann, PT  10/13/2018 4:34 PM

## 2018-10-11 ENCOUNTER — Ambulatory Visit: Admit: 2018-10-11 | Discharge: 2018-10-12 | Payer: MEDICARE

## 2018-10-11 DIAGNOSIS — E875 Hyperkalemia: Secondary | ICD-10-CM

## 2018-10-11 DIAGNOSIS — N184 Chronic kidney disease, stage 4 (severe): Secondary | ICD-10-CM

## 2018-10-11 DIAGNOSIS — E1122 Type 2 diabetes mellitus with diabetic chronic kidney disease: Secondary | ICD-10-CM

## 2018-10-11 DIAGNOSIS — Z794 Long term (current) use of insulin: Secondary | ICD-10-CM

## 2018-10-11 LAB — BASIC METABOLIC PANEL
BLOOD UREA NITROGEN: 35 mg/dL — ABNORMAL HIGH (ref 7–21)
BUN / CREAT RATIO: 10
CALCIUM: 9.5 mg/dL (ref 8.5–10.2)
CO2: 22 mmol/L (ref 22.0–30.0)
CREATININE: 3.38 mg/dL — ABNORMAL HIGH (ref 0.70–1.30)
EGFR CKD-EPI AA MALE: 23 mL/min/{1.73_m2} — ABNORMAL LOW (ref >=60)
EGFR CKD-EPI NON-AA MALE: 20 mL/min/{1.73_m2} — ABNORMAL LOW (ref >=60)
GLUCOSE RANDOM: 222 mg/dL — ABNORMAL HIGH (ref 70–179)
POTASSIUM: 5.1 mmol/L — ABNORMAL HIGH (ref 3.5–5.0)
SODIUM: 140 mmol/L (ref 135–145)

## 2018-10-11 LAB — HEMOGLOBIN A1C: HEMOGLOBIN A1C: 7.9 % — ABNORMAL HIGH (ref 4.8–5.6)

## 2018-10-11 LAB — ESTIMATED AVERAGE GLUCOSE: Estimated average glucose:MCnc:Pt:Bld:Qn:Estimated from glycated hemoglobin: 180

## 2018-10-11 LAB — SODIUM: Sodium:SCnc:Pt:Ser/Plas:Qn:: 140

## 2018-10-11 MED FILL — PRALUENT PEN 150 MG/ML SUBCUTANEOUS PEN INJECTOR: SUBCUTANEOUS | 56 days supply | Qty: 4 | Fill #2

## 2018-10-11 MED FILL — PRALUENT PEN 150 MG/ML SUBCUTANEOUS PEN INJECTOR: 56 days supply | Qty: 4 | Fill #2 | Status: AC

## 2018-10-11 NOTE — Unmapped (Signed)
Patient triage and consent is complete. Patient is homeless at this time. I presented to them to go to [place] [facility] [place] which they live and they say they have already been. They were told there's nothing they can do for them. I told them I would document this in their records.

## 2018-10-11 NOTE — Unmapped (Signed)
Return in about 4 weeks (around 11/05/2018).    Check out comments: video

## 2018-10-12 DIAGNOSIS — E1143 Type 2 diabetes mellitus with diabetic autonomic (poly)neuropathy: Principal | ICD-10-CM

## 2018-10-12 DIAGNOSIS — K210 Gastro-esophageal reflux disease with esophagitis: Secondary | ICD-10-CM

## 2018-10-12 MED ORDER — OMEPRAZOLE 40 MG CAPSULE,DELAYED RELEASE
ORAL_CAPSULE | Freq: Two times a day (BID) | ORAL | 3 refills | 90.00000 days | Status: CP
Start: 2018-10-12 — End: ?
  Filled 2018-10-26: qty 180, 90d supply, fill #0

## 2018-10-12 NOTE — Unmapped (Signed)
You were seen by Tomma Lightning MPAS, PA-C for   Problem List Items Addressed This Visit        Digestive    Diabetes mellitus with gastroparesis (CMS-[name])    Relevant Orders    US Abdomen Limited    GERD (gastroesophageal reflux disease) - Primary    Relevant Orders    US Abdomen Limited           It was my pleasure seeing you today in the [facility] Gastroenterology department. As we discussed today during your appointment, I have recommended the following:    Increase Prilosec from 20 mg once a day to 40 mg twice a day.  A new prescription was sent in to the [facility].     Follow a gastroparesis diet.  Gastroparesis is a condition in which the stomach is extra slow and moving foods out causing nausea, bloating, abdominal pain, reflux, etc.  Please review the link below for dietary recommendations.      [email or web address]    Will obtain an ultrasound of your gallbladder to rule out stones.     Follow up in 2-4 weeks.     It is important that you remain active in your healthcare.  If further work-up, tests or consultations are recommended, you should be contacted to get these scheduled.  If for any reason, you do not hear from someone within 7 days to get these scheduled, you should reach out to me via EP[facility] MyChart or at one of the numbers below and together, we'll ensure you get the appropriate appointments.  Again, you need to take responsibility for your healthcare by ensuring appropriate follow-up.  I am here to assist you in getting the healthcare that you need and deserve.     Notes and/or results from recommended tests or consultation are to be sent to me as the ordering provider.  After I receive these results, I will follow-up with you as appropriate by [facility] MyChart, phone, letter or at your scheduled follow-up clinic appointment.  If for whatever reason you do not hear from me within 7-10 days after you've completed any tests/work-up, but do require further recommendations and/or instructions based on the results prior to a scheduled follow-up clinic visit, please contact me.     Please do not hesitate to reach out to me if you have questions or concerns in the interim.        Important phone numbers:       [employer] RN: [phone]   Fax: [phone]     GI Clinic Appointments and GI Procedure Appointments:  [phone]  To schedule, reschedule, or cancel your GI clinic or procedure appointment, please call [phone]. If you are unable to come to an appointment, please notify us as soon as possible, preferably 24 hours in advance. This allows other patients with urgent needs to be cared for quicker.     Radiology: [phone], option 3 or 4     For emergencies after normal business hours or on weekends/holidays   If you are experiencing a medical emergency or need urgent medical care, call 911 immediately.    For GI medical needs after hours or on holidays, please call [phone] and ask to speak to the Gastroenterology Fellow on call.    Forms, Letters, Medical Records: [phone]   [email or web address]    Best,  Faylene Million Jacqulyn Ducking, MPAS, PA-C

## 2018-10-12 NOTE — Unmapped (Signed)
[facility] at [facility] ([facility])      [facility] Faculty Consultation Visit Note                REFERRING PROVIDER:    Roma Schanz, MD  [address]  [place] [address] [place],  [place] [address]    PRIMARY CARE PROVIDER:    Kurtis Bushman, MD    Patient Care Team:  Roma Schanz, MD as PCP - General (Internal Medicine)  Roma Schanz, MD as PCP - Rande Brunt (Internal Medicine)  Neldon Labella, RD/LDN as Dietitian (Dietitian)  Julieanne Cotton, PA as Physician Assistant (Internal Medicine)  Ebbie Latus, LCSW as Social Worker (Internal Medicine)  Griffin Basil as Nurse Practitioner (Cardiology)  Joretta Bachelor, MD as Consulting Physician (Cardiology)  Marlaine Hind, MD as Referring Physician (Cardiology)  Perlie Gold, RN as Case Manager      PATIENT PROFILE:        Jeremy Oliver is a 48 y.o. male (DOB: 15-Jun-1970) who is seen in consultation at the request of Dr. Hulan Fess for evaluation of nausea and vomiting.     Problem List Items Addressed This Visit        Digestive    Diabetes mellitus with gastroparesis (CMS-[name])    Relevant Orders    US Abdomen Limited    GERD (gastroesophageal reflux disease) - Primary    Relevant Orders    US Abdomen Limited             ASSESSMENT:        This is a 48 y.o. year old male with esophagitis and uncontrolled DM w/gastroparesis who presents with a several month history of intermittent nausea, vomiting of undigested food from 12+hours prior, weight loss of 5 lbs.  Has complicated medical history including CKD, recent NSTEMI and is a very poor candidate for repeat left heart cath given his advanced CKD and likelihood??of progression to dialysis.??Patient strongly states he would not like to go through dialysis.  This makes his surgical candidacy for antireflux surgery even less likely.              PLAN:          1.  RUQ [place] to rule out cholelithiasis.   2.  Maximize PPI to Prilosec 40 mg bid due to history of esophagitis, gastroduodenitis on endoscopies.  Should likely maintain this dose indefinitely.    3.  Avoid gaviscon due to CKD.   4.  Follow the gastroparesis diet to minimize gastric workload and improve stomach emptying.    5.  Follow up in 2-4 weeks.  Consider repeat EGD pending the above and cardiac clearance.    6.  My contact information was provided and he will call with any questions or concerns in the interim.      Thank you for this consultation.      I spent 19 minutes on the phone with the patient. I spent an additional 15 minutes on pre- and post-visit activities.     The patient was physically located in [place] or a state in which I am permitted to provide care. The patient and/or parent/guardian understood that s/he Preisler incur co-pays and cost sharing, and agreed to the telemedicine visit. The visit was reasonable and appropriate under the circumstances given the patient's presentation at the time.    The patient and/or  parent/guardian has been advised of the potential risks and limitations of this mode of treatment (including, but not limited to, the absence of in-person examination) and has agreed to be treated using telemedicine. The patient's/patient's family's questions regarding telemedicine have been answered.     If the visit was completed in an ambulatory setting, the patient and/or parent/guardian has also been advised to contact their provider???s office for worsening conditions, and seek emergency medical treatment and/or call 911 if the patient deems either necessary.            CHIEF COMPLAINT: Nausea and vomiting    HISTORY OF PRESENT ILLNESS: This is a 48 y.o. year old male presenting to the [place] [facility] [place] [facility] ([facility]) clinic today in consultation for nausea and vomiting.  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    Jeremy Oliver visit is conducted by phone today due to COVID-19 - he doesn't know if he got the video text.  Jeremy Oliver states he is driving home with his wife, Lucien Mons, and agrees to proceed with this visit.     Mr. Kynard states for the past three months he has episodes of nausea and vomiting with food that he has eaten 12+ hours prior.  The food is undigested and in chunks.  Other times he is vomiting up just liquid bile.  This has caused him to lose about 5 lbs and he feels fatigued and lethargic during episodes.  He does not follow a gastroparesis diet and there is mention of diabetic gastroparesis on his chart, a recent gastric emptying study was normal.      Jeremy Oliver also has extensive esophagitis and gastritis on endoscopies in 2017 and 2018, however is currently only managed with Prilosec 20 mg once daily.  He states he has never been on this twice daily.  He reports pyrosis, however no regurgitation.  He does not endorse dysphagia or cough with po intake.  He denies hoarseness, sore throat, odynophagia, chest pain, dysphagia, diarrhea, constipation, hematochezia, or melena.  He has had a recent NSTEMI and is a very poor candidate for repeat left heart cath given his advanced CKD and likelihood??of progression to dialysis.??Patient strongly states he would not like to go through dialysis.      DIAGNOSTIC STUDIES:  I have reviewed and summarized previous medical records which illustrated:     GI Procedures:    EGD 05/07/2016:   - LA Grade A reflux esophagitis.  - Gastritis.  - Erythematous duodenopathy.  - Normal second portion of the duodenum.  - No specimens collected.    EGD 02/28/2016:   - LA Grade C reflux esophagitis.  - Tortuous esophagus.  - Erythematous mucosa in the antrum. Biopsied.  - Erythematous duodenopathy.    A: Stomach, biopsy   - Antral-type mucosa with changes consistent with reactive gastropathy  - Histologically unremarkable oxyntic-type  mucosa   - No Helicobacter organisms identified by H&E stain  - No intestinal metaplasia or dysplasia identified     Radiographic studies:    Xr Chest Portable    Result Date: 09/28/2018  EXAM: XR CHEST PORTABLE DATE: 09/28/2018 at 1533 hours ACCESSION: [id number] UN DICTATED: 09/28/2018 3:55 PM INTERPRETATION LOCATION: Main Campus     CLINICAL INDICATION: 48 years old Male with CHEST PAIN      COMPARISON: Chest x-ray from 09/21/2018     TECHNIQUE: Portable Chest Radiograph.     FINDINGS:     Left chest wall pacemaker/AICD.     Mildly hyperinflated lungs. No consolidation.     No pleural effusion or pneumothorax     Stable cardiomediastinal silhouette.     Remote left rib fractures.         No acute airspace disease.    Xr Chest 2 Views    Result Date: 09/21/2018  EXAM: XR CHEST 2 VIEWS DATE: 09/21/2018 6:39 AM ACCESSION: [id number] UN DICTATED: 09/21/2018 6:43 AM INTERPRETATION LOCATION: Main Campus     CLINICAL INDICATION: 48 years old Male with CHEST PAIN      COMPARISON: Chest radiograph 07/22/2018     TECHNIQUE: AP and Lateral Chest Radiographs.     FINDINGS:     Radiographically clear lungs.     No pleural effusion or pneumothorax.     Unremarkable cardiomediastinal silhouette. Unchanged AICD with leads in place. Remote left rib fracture deformities.             No acute airspace disease.    Ep Rn Only - Icd / Pacemaker Evaluation    Result Date: 09/21/2018  Lawana Pai, RN     09/21/2018 10:14 AM ICD Interrogation RE: routine     Manufacturer: Designer, jewellery: Vigilant EL ICD Type of Device: Oceanographer     Battery: 12 yearsestimated longevity     Mode: DDD Lower rate limit 70  bpm Tachy Settings:         Presenting Rhythm: AS/VS Underlying rhythm: SR 84 Dependent:  No     Pacing Percentages AP: 17 RVP: <1     Impedence (ohms) Atrial lead 630 Right Ventricular lead 311 HV/SVC 50  Sensing (mV) (P)Atrial lead 5.9 (R)Ventricular lead 13.3     Capture threshold (V @ ms) Atrial lead 1.0 V @ 0.40 ms Right Ventricular lead 0.8 V @ 0.40 ms     Diagnostics/Episodes ?? No recent events recorded  Reprogramming ?? None     See PDF in media tab for full interrogation record     Questions, please call the Ep Device Rn via Vocera or dial ext [phone] Ep Device clinic [phone]    GES 08/31/2018: normal    Laboratory results:    Lab Results   Component Value Date    WBC 9.5 09/30/2018    RBC 3.45 (L) 09/30/2018    HGB 8.7 (L) 09/30/2018    HCT 28.0 (L) 09/30/2018    MCV 81.2 09/30/2018    MCH 25.2 (L) 09/30/2018    MCHC 31.0 09/30/2018    RDW 18.4 (H) 09/30/2018    PLT 322 09/30/2018    MPV 8.6 09/30/2018       Chemistry        Component Value Date/Time    NA 140 10/11/2018 1202    NA 132 (L) 03/25/2017 1714    K 5.1 (H) 10/11/2018 1202    K 5.5 (H) 03/25/2017 1714    CL 108 (  H) 10/11/2018 1202    CL 105 06/09/2014 0525    CO2 22.0 10/11/2018 1202    CO2 23 06/09/2014 0525    BUN 35 (H) 10/11/2018 1202    BUN 24 (H) 06/09/2014 0525    CREATININE 3.38 (H) 10/11/2018 1202    CREATININE 1.30 06/09/2014 0525    GLU 222 (H) 10/11/2018 1202        Component Value Date/Time    CALCIUM 9.5 10/11/2018 1202    CALCIUM 8.3 (L) 06/09/2014 0525    ALKPHOS 73 09/28/2018 1511    ALKPHOS 98 06/08/2014 1232    AST 15 (L) 09/28/2018 1511    AST 37 06/08/2014 1232    ALT 11 09/28/2018 1511    ALT 36 06/08/2014 1232    BILITOT 0.1 09/28/2018 1511    BILITOT 0.8 06/08/2014 1232        Lab Results   Component Value Date    A1C 7.9 (H) 10/11/2018      Lab Results   Component Value Date    LIPASE 138 09/14/2018         REVIEW OF SYSTEMS:     Pertinent positives and negatives are documented as per HPI; all other systems reviewed and negative.      PAST MEDICAL HISTORY:    Past Medical History:   Diagnosis Date   ??? Angina at rest (CMS-[name])    ??? Arthritis    ??? Asthma    ??? Atrial fibrillation and flutter (CMS-[name]) 06/08/14   ??? BMI 40.0-44.9, adult (CMS-[name]) 10/29/2015   ??? Chronic anticoagulation 02/17/2018   ??? Chronic kidney disease, stage 3 (CMS-[name])    ??? Chronic pain syndrome 04/11/2014    ~Use daily lyrica as recommended ~Tramadol as needed for pain ~Fluoxetine daily as recommended ~Daily exercise ~Eat a healthy diet ~Good control of your blood sugars can help    ??? Coronary artery disease    ??? Depression    ??? Diabetes mellitus (CMS-[name])    ??? Eye trauma 1998    glass in both eyes and removed   ??? GERD (gastroesophageal reflux disease)    ??? Gout    ??? Homeless    ??? Hyperlipidemia    ??? Hypertension    ??? Illiteracy and low-level literacy    ??? Microscopic hematuria    ??? Migraine    ??? Nephrolithiasis    ??? 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 Nonalcoholic fatty liver disease    ??? NSTEMI (non-ST elevated myocardial infarction) (CMS-[name]) 01/13/2017   ??? Obesity    ??? Obstructive sleep apnea 2009   ??? Panic attacks    ??? 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 Seizure-like activity (CMS-[name])     ~Monitor for symptoms and report them to your primary care provider if they occur    ??? Systolic CHF, chronic (CMS-[name])     EF 40-45% 2017   ??? TIA (transient ischemic attack)        PAST SURGICAL HISTORY:    Past Surgical History:   Procedure Laterality Date   ??? CARDIAC  CATHETERIZATION  03/08/2007   ??? CYSTOURETHROSCOPY  12/31/2009     Microscopic hematuria   ??? KNEE SURGERY Right 09/23/2006    arthroscopic knee surgery medial and lateral meniscus tears as well as crystal disease   ??? PR CATH PLACE/CORON ANGIO, IMG SUPER/INTERP,R&L HRT CATH, L HRT VENTRIC N/A 02/13/2017    Procedure: Left/Right Heart Catheterization;  Surgeon: Marlaine Hind, MD;  Location: [facility] CATH;  Service: Cardiology   ??? PR CATH PLACE/CORON ANGIO, IMG SUPER/INTERP,W LEFT HEART VENTRICULOGRAPHY N/A 03/17/2017    Procedure: Left Heart Catheterization W Intervention;  Surgeon: Marlaine Hind, MD;  Location: [facility] CATH;  Service: Cardiology   ??? PR CATH PLACE/CORON ANGIO, IMG SUPER/INTERP,W LEFT HEART VENTRICULOGRAPHY N/A 05/12/2018    Procedure: CATH LEFT HEART CATHETERIZATION W INTERVENTION;  Surgeon: Dorathy Kinsman, MD;  Location: [facility] CATH;  Service: Cardiology   ??? PR ECMO/ECLS INITIATION VENO-ARTERIAL N/A 03/19/2017    Procedure: ECMO/ECLS; INITIATION, VENO-ARTERIAL;  Surgeon: Lennie Odor, MD;  Location: MAIN OR [facility];  Service: Cardiac Surgery   ??? PR ECMO/ECLS RMVL PRPH CANNULA OPEN 6 YRS & OLDER Left 03/25/2017    Procedure: ECMO / ECLS PROVIDED BY PHYSICIAN; REMOVAL OF PERIPHERAL (ARTERIAL AND/OR VENOUS) CANNULA(E), OPEN, 6 YEARS AND OLDER;  Surgeon: Alonna Buckler Ikonomidis, MD;  Location: MAIN OR [facility];  Service: Cardiac Surgery   ??? PR EPHYS EVAL W/ ABLATION SUPRAVENT ARRHYTHMIA N/A 07/19/2015    Catheter ablation of cavotricuspid isthmus for atrial flutter [facility])   ??? PR INSER HART PACER XVENOUS ATRIAL N/A 05/30/2015    [employer] dual-chamber pacemaker implant (E.Chung)   ??? PR INSERT/PLACE FLOW DIRECT CATH N/A 03/17/2017    Procedure: Insert Leave In [place];  Surgeon: Marlaine Hind, MD;  Location: [facility] CATH;  Service: Cardiology   ??? PR INSJ/RPLCMT PERM DFB W/TRNSVNS LDS 1/DUAL CHMBR N/A 05/04/2017    Procedure: ICD Implant System (Single/Dual);  Surgeon: Eldred Manges, MD;  Location: [facility] CATH;  Service: Cardiology   ??? PR NEGATIVE PRESSURE WOUND THERAPY DME >50 SQ CM Left 03/25/2017    Procedure: Neg Press Wound Tx (Vac Assist) Incl Topicals, Per Session, Tsa Greater Than/= 50 Cm Squared;  Surgeon: Alonna Buckler Ikonomidis, MD;  Location: MAIN OR [facility];  Service: Cardiac Surgery   ??? PR PRQ TRLUML CORONARY STENT W/ANGIO ONE ART/BRNCH N/A 03/12/2018    Procedure: Percutaneous Coronary Intervention;  Surgeon: Marlaine Hind, MD;  Location: [facility] CATH;  Service: Cardiology   ??? PR REBL VES DIRECT,LOW EXTREM Left 03/19/2017    Procedure: Repr Bld Vessel Direct; Lower Extrem;  Surgeon: Boykin Reaper, MD;  Location: MAIN OR [facility];  Service: Vascular   ??? PR REMV ART CLOT ILIAC-POP,LEG INCIS Left 03/25/2017    Procedure: EMBOLECTOMY OR THROMBECTOMY, WITH OR WITHOUT CATHETER; FEMOROPOPLITEAL, AORTOILIAC ARTERY, BY LEG INCISION;  Surgeon: Alonna Buckler Ikonomidis, MD;  Location: MAIN OR [facility];  Service: Cardiac Surgery   ??? PR UPPER GI ENDOSCOPY,BIOPSY N/A 02/28/2016    Procedure: UGI ENDOSCOPY; WITH BIOPSY, SINGLE OR MULTIPLE;  Surgeon: Liane Comber, MD;  [facility]: [facility];  Service: Gastroenterology   ??? PR UPPER GI ENDOSCOPY,DIAGNOSIS N/A 05/07/2016    Procedure: UGI ENDO, INCLUDE ESOPHAGUS, STOMACH, & DUODENUM &/OR JEJUNUM; DX W/WO COLLECTION SPECIMN, BY BRUSH OR WASH;  Surgeon: Modena Nunnery, MD;  Location: [facility];  Service: Gastroenterology       MEDICATIONS:   Current Outpatient Medications Medication Sig Dispense Refill   ??? acetaminophen (TYLENOL) 500 MG tablet  TAKE 2 TABLETS (1 000MG ) BY MOUTH EVERY 8 HOURS AS NEEDED FOR PAIN 180 each 3   ??? albuterol HFA 90 mcg/actuation inhaler Inhale 2 puffs into the lungs every 6 (six) hours as needed for wheezing or shortness of breath. 18 g 2   ??? alirocumab (PRALUENT) 150 mg/mL subcutaneous injection Inject 1 mL (150 mg total) under the skin every fourteen (14) days. 4 Syringe 11   ??? allopurinoL (ZYLOPRIM) 100 MG tablet Take 0.5 tablets (50 mg total) by mouth daily. 45 tablet 3   ??? amiodarone (PACERONE) 200 MG tablet TAKE 1 TABLET BY MOUTH ONCE DAILY 90 tablet 3   ??? amLODIPine (NORVASC) 10 MG tablet Take 1 tablet (10 mg total) by mouth daily. 90 tablet 3   ??? arm brace Misc 1 each by Miscellaneous route daily. 1 each 0   ??? atorvastatin (LIPITOR) 80 MG tablet Take 1 tablet (80 mg total) by mouth daily. 90 tablet 3   ??? blood sugar diagnostic Strp USE TO CHECK BLOOD SUGAR 4 TIMES DAILY (BEFORE MEALS AND NIGHTLY) 100 each 3   ??? blood-glucose meter kit Use to check blood sugars, #1 meter, #100 strips. Freestyle glucose meter. ICD-9 250.00 1 each 0   ??? bumetanide (BUMEX) 2 MG tablet Take 1 tablet (2 mg total) by mouth daily. 90 tablet 3   ??? carvediloL (COREG) 25 MG tablet TAKE 1 TABLET BY MOUTH WITH THE MORNING MEAL AND TAKE 2 TABLETS WITH THE EVENING MEAL 270 tablet 3   ??? cholecalciferol, vitamin D3, 5,000 unit capsule Take 1 capsule (5,000 Units total) by mouth daily. 100 capsule 0   ??? diclofenac sodium (VOLTAREN) 1 % gel Apply 2 grams topically Three (3) times a day. 100 g 11   ??? empty container Misc Use as directed to dispose of injectable medications 1 each 3   ??? ezetimibe (ZETIA) 10 mg tablet TAKE 1 TABLET BY MOUTH ONCE DAILY 90 each 3   ??? FLUoxetine (PROZAC) 40 MG capsule Take 2 capsules (80 mg total) by mouth daily. 180 capsule 3   ??? gabapentin (NEURONTIN) 300 MG capsule Take 1 capsule (300 mg total) by mouth two (2) times a day. 180 capsule 3   ??? insulin ASPART (NOVOLOG FLEXPEN U-100 INSULIN) 100 unit/mL (3 mL) injection pen Inject 0.2 mL (20 Units total) under the skin three (3) times a day with a meal. 15 mL 11   ??? insulin detemir U-100 (LEVEMIR) 100 unit/mL (3 mL) injection pen Inject 0.4 mL (40 Units total) under the skin nightly. 15 mL 3   ??? insulin syringe-needle U-100 0.3 mL 31 gauge x 5/16 Syrg Use to inject insulin twice daily with meals 60 each 0   ??? isosorbide mononitrate (IMDUR) 30 MG 24 hr tablet Take 3 tablets (90 mg total) by mouth daily. 90 tablet 0   ??? nitroglycerin (NITROSTAT) 0.4 MG SL tablet Place 1 tablet (0.4 mg total) under the tongue every five (5) minutes as needed for chest pain. 100 each 3   ??? ondansetron (ZOFRAN) 4 MG tablet Take 1 tablet (4 mg total) by mouth every eight (8) hours as needed for nausea for up to 4 days. 10 tablet 0   ??? QUEtiapine (SEROQUEL) 50 MG tablet Take 1 tablet (50 mg total) by mouth two (2) times a day as needed (headache). 180 tablet 3   ??? rivaroxaban (XARELTO) 15 mg Tab Take 1 tablet (15 mg total) by mouth daily with evening meal. 90 tablet 3   ???  semaglutide 0.25 mg or 0.5 mg(2 mg/1.5 mL) PnIj Inject 0.5 mg under the skin every seven (7) days. 4.5 mL 3   ??? sevelamer (RENVELA) 800 mg tablet TAKE 2 TABLETS (1600MG ) BY MOUTH THREE TIMES DAILY WITH MEALS 540 tablet 3   ??? sodium polystyrene, SPS, with sorbitol (SPS, WITH SORBITOL,) 15-20 gram/60 mL Susp Take 60 mL (15 g) by mouth once a week. 473 mL 3   ??? ticagrelor (BRILINTA) 90 mg Tab TAKE 1 TABLET BY MOUTH TWICE DAILY 180 tablet 3   ??? UNIFINE PENTIPS 31 gauge x 5/16 Ndle USE WITH INSULIN AND VICTOZA 5 TIMES DAILY 400 each 3   ??? omeprazole (PRILOSEC) 40 MG capsule Take 1 capsule (40 mg total) by mouth Two (2) times a day (30 minutes before a meal). 180 capsule 3     No current facility-administered medications for this visit.        ALLERGIES:    Allergies   Allergen Reactions   ??? Losartan      Hyperkalemia (K>6)   ??? Morphine Palpitations     Medication error: was administered 20mL instead of 2mL       SOCIAL HISTORY:    Social History     Socioeconomic History   ??? Marital status: Married     Spouse name: [name]   ??? Number of children: 3   ??? Years of education: 79   ??? Highest education level: None   Occupational History   ??? Occupation: DISABLED     Comment: Former Surveyor, quantity Needs   ??? Financial resource strain: Very hard   ??? Food insecurity     Worry: None     Inability: None   ??? Transportation needs     Medical: Yes     Non-medical: Yes   Tobacco Use   ??? Smoking status: Never Smoker   ??? Smokeless tobacco: Current User     Types: Chew   ??? Tobacco comment: Pt dips 1 can a week, Pt not interested in quitting    Substance and Sexual Activity   ??? Alcohol use: Never     Alcohol/week: 0.0 standard drinks     Frequency: Never     Comment: rare use: couple times a year, small amount   ??? Drug use: No   ??? Sexual activity: Yes     Partners: Female   Lifestyle   ??? Physical activity     Days per week: None     Minutes per session: None   ??? Stress: None   Relationships   ??? Social Wellsite geologist on phone: More than three times a week     Gets together: More than three times a week     Attends religious service: 1 to 4 times per year     Active member of club or organization: No     Attends meetings of clubs or organizations: Never     Relationship status: Married   Other Topics Concern   ??? Do you use sunscreen? No   ??? Tanning bed use? No   ??? Are you easily burned? No   ??? Excessive sun exposure? No   ??? Blistering sunburns? No   Social History Narrative    Information updated:  01/08/16. Reviewed 08/12/17:        Born in [place]    Raised with both parents    Parents died w/in 6 mos of each other. His parents knew they were  going to pass and told him just before.    Half sister and 2 half brothers        Married 30 yrs. Wife [name]    2 other children son and daughter, adopted out    Daughter Bridgette, bf DJ        Education - completed through 11[employer]. 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    Former Presenter, broadcasting since age 56    Lives with wife, difficulty paying bills 12/30/17     Lives next door to daughter, husband and their grandson        05/19/18: pt moving to new home in next 5 days- after an eviction. Has Medicaid assistance again. Will be w/ wife [name], they babysit their grandson often.    06/14/18: Pt and husband living with daughter while new location to live is determined.       FAMILY HISTORY:    Family History   Problem Relation Age of Onset   ??? Diabetes Mother    ??? Hyperlipidemia Mother    ??? Heart disease Mother    ??? Basal cell carcinoma Mother    ??? Blindness Mother         diabeties   ??? Obesity Mother    ??? Hyperlipidemia Father    ??? Heart disease Father    ??? Lung disease Father    ??? Heart disease Half-Sister    ??? Kidney disease Half-Sister    ??? Gallbladder disease Half-Brother    ??? Obesity Half-Brother    ??? No Known Problems Daughter    ??? No Known Problems Son    ??? Obesity Daughter    ??? Melanoma Neg Hx    ??? Squamous cell carcinoma Neg Hx    ??? Macular degeneration Neg Hx    ??? Glaucoma Neg Hx            VITAL SIGNS:    There were no vitals taken for this visit.    PHYSICAL EXAM:  Physical exam is not performed today due to telehealth visit.

## 2018-10-13 DIAGNOSIS — M25562 Pain in left knee: Secondary | ICD-10-CM

## 2018-10-13 DIAGNOSIS — M21372 Foot drop, left foot: Secondary | ICD-10-CM

## 2018-10-13 DIAGNOSIS — G8929 Other chronic pain: Secondary | ICD-10-CM

## 2018-10-13 DIAGNOSIS — M25561 Pain in right knee: Secondary | ICD-10-CM

## 2018-10-13 DIAGNOSIS — M150 Primary generalized (osteo)arthritis: Principal | ICD-10-CM

## 2018-10-13 NOTE — Unmapped (Signed)
[facility] PT [facility] [place] [facility]  OUTPATIENT PHYSICAL THERAPY  10/13/2018  Note Type: Evaluation       Patient Name: Jeremy Oliver  Date of Birth:January 10, 1971  Diagnosis:   Encounter Diagnoses   Name Primary?   ??? Primary osteoarthritis involving multiple joints    ??? Left foot drop    ??? Chronic pain of both knees Yes     Referring MD:  Orrin Brigham, MD     Plan of Care Effective Date: 10/13/18 - 01/13/19      Assessment & Plan     Assessment details: Patient presents to physical therapy for evaluation and treatment of left foot drop and bilateral knee pain.  His foot drop has been present since 2018 and he showed no active dorsiflexion or eversion today.  Bilateral knee pain appears most consistent with OA. Patient would benefit from physical therapy to address deficits enumerated in the problem list below and allow for decreased risk of falls.      Impairments: pain, decreased range of motion, decreased strength, poor awareness of body mechanics, gait deviation, decreased mobility and decreased endurance        Personal Factors/Comorbidities: 3+  Specific Comorbidities: obesity, DM, pacemaker/defibrillator, chronic pain, tobacco use  Examination of Body Systems: 4+ elements  Body System: gait, ROM, strength, sensation, activity limitations, participation restrictions   Clinical Presentation: stable  Clinical Decision Making: low    Prognosis: good      Positive Prognosis Rationale: motivated for treatment.   Negative Prognosis Rationale: Pain Status, medical status/condition, chronicity of condition and severity of symptoms.        Therapy Goals  Goals: To be completed in 3 months:  1) Patient initiates exercise program performed at least 3 days a week   2) Patient's TUG improves to 16 seconds or less indicating decreased risk for falls.  3) Global improvement rating >/=50%.  4) Knee pain decreases to 7/10 or less at all times.    Plan  Therapy options: will be seen for skilled physical therapy services    Planned therapy interventions: manual therapy, neuromuscular re-education, body mechanics training, postural training, self-care / home training, taping, TENs, therapeutic exercises, therapeutic activities, education - patient, gait training, home exercise program, education - family/caregiver and Aquatic Therapy        Frequency: 1x week    Duration in weeks: up to 12 weeks    Education provided to: patient.  Education provided: body awareness, HEP, importance of Therapy, role of therapy in Rehabilitation, symptom management, treatment options and plan, posture and indications/contraindications to exercises  Education Results: needs reinforcement and needs further instruction.  Communication/Consultation: Medicare Cert/POC sent to Referring Provider.    Total Session Time: 50            Subjective     History of Present Illness  Date of Onset: 10/12/2016    Date of Evaluation: 10/13/2018    Reason for Referral/Chief Complaint: L foot drop and (B) knee pain      Subjective: Pt presents to PT with chief complaints of Left foot drop and (B) knee pain.  He reports his foot drop is a result of the doctors going in through his groin during an angioplasty after his first major heart attack in 2018.  He states he's had ~9 heart attacks since then.  He received an AFO in 2018 and has continued to wear it since then.  He also reports that both knees kill him.  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  Knees hurt constantly.  Walking is limited to 15 minutes. Can no longer walk his dog or hunt because of his knee pain. Pt has had 2 falls in the past year.  He states he has no feeling past the knee on his left side.          Pain      Current pain rating: 0      At best pain rating: 0      At worst pain rating: 10      Location: both knees, left worse than right          Relieving factors: rest, sitting and elevation Aggravating factors: standing and walking      Pain Related Behaviors: none    Progression: worsening      Red flags: none    Prior Functional Status: No physical limitations.        Current functional status: limited lifting, limited standing tolerance, limited work capacity, use of assistive device, limited recreation, difficulty with transfers, limited walking tolerance and unsteady gait      Precautions and Equipment  Precautions: Fall risk  Current Braces/Orthoses: AFO  Equipment Currently Used: Single point cane    Social Support  Lives in: [place] house  Lives with: spouse and other  Communication Preference: verbal, written and visual   Barriers to Learning: No Barriers      Diagnostic Tests      X-ray: abnormal                          Diagnostic Test Comments: Right knee x-ray in April 2020: Mild tricompartmental osteoarthrosis.    Treatments      Previous treatment: home health PT and physical therapy                      Patient Goals  Patient goals for therapy: decreased pain, increased strength and improved ambulation                Objective    Gait: pt ambulates with SPC in LEFT hand, slowed gait speed, wide BOS, excessive toe-out bilaterally  TUG: 20 seconds with SPC  No notable edema  Knee ROM: WNL bilaterally  Ankle ROM: WNL on R, no active ROM DF or eversion on left  MMT: 5/5 (B) hip flexion, 5/5 (B) Knee extension, 5/5 (B) knee flexion, 5/5 (R) ankle DF and PF, 0/5 (L) ankle DF, 2/5 (L) ankle PF  Sensation to light touch: WNL throughout R LE, no feeling on L LE from knee down through foot      Treatment Rendered Today:    Evaluation: 40 minutes    Therex: 10 minutes  Education on condition, prognosis, POC  Gait training with cane in RIGHT hand, advised pt to try at home  Reviewed pt's current HHPT HEP: DL squat and seated marching (this is what patient could recall) --> encouraged him to continue these    Total treatment time: 50 minutes      PT Evaluation  $$ PT Evaluation - Low Complexity [mins]: 40     Therapeutic Interventions  $$ Therapeutic Exercise [mins]: 10             I attest that I have reviewed the above information.  Signed: Ashok Cordia, PT  10/13/2018 5:24 PM

## 2018-10-15 ENCOUNTER — Ambulatory Visit
Admit: 2018-10-15 | Discharge: 2018-10-19 | Disposition: A | Discharge status: Home / Self Care | Payer: MEDICARE | Attending: Rehabilitative and Restorative Service Providers"

## 2018-10-15 ENCOUNTER — Ambulatory Visit: Admit: 2018-10-15 | Discharge: 2018-10-19 | Disposition: A | Discharge status: Home / Self Care | Payer: MEDICARE

## 2018-10-15 DIAGNOSIS — R079 Chest pain, unspecified: Principal | ICD-10-CM

## 2018-10-15 LAB — BASIC METABOLIC PANEL
ANION GAP: 10 mmol/L (ref 7–15)
ANION GAP: 8 mmol/L (ref 7–15)
BLOOD UREA NITROGEN: 41 mg/dL — ABNORMAL HIGH (ref 7–21)
BLOOD UREA NITROGEN: 41 mg/dL — ABNORMAL HIGH (ref 7–21)
BUN / CREAT RATIO: 10
BUN / CREAT RATIO: 11
CALCIUM: 8.8 mg/dL (ref 8.5–10.2)
CALCIUM: 8.7 mg/dL (ref 8.5–10.2)
CHLORIDE: 109 mmol/L — ABNORMAL HIGH (ref 98–107)
CO2: 23 mmol/L (ref 22.0–30.0)
CO2: 23 mmol/L (ref 22.0–30.0)
CREATININE: 4.04 mg/dL — ABNORMAL HIGH (ref 0.70–1.30)
EGFR CKD-EPI AA MALE: 19 mL/min/{1.73_m2} — ABNORMAL LOW (ref >=60)
EGFR CKD-EPI NON-AA MALE: 16 mL/min/{1.73_m2} — ABNORMAL LOW (ref >=60)
EGFR CKD-EPI NON-AA MALE: 17 mL/min/{1.73_m2} — ABNORMAL LOW (ref >=60)
GLUCOSE RANDOM: 231 mg/dL — ABNORMAL HIGH (ref 70–179)
GLUCOSE RANDOM: 122 mg/dL (ref 70–179)
POTASSIUM: 5.5 mmol/L — ABNORMAL HIGH (ref 3.5–5.0)
POTASSIUM: 5.2 mmol/L — ABNORMAL HIGH (ref 3.5–5.0)
SODIUM: 142 mmol/L (ref 135–145)
SODIUM: 141 mmol/L (ref 135–145)

## 2018-10-15 LAB — TROPONIN I
TROPONIN I: <0.06 ng/mL (ref <0.060)
Troponin I.cardiac:MCnc:Pt:Ser/Plas:Qn:: 0.08
Troponin I.cardiac:MCnc:Pt:Ser/Plas:Qn:: 0.24
Troponin I.cardiac:MCnc:Pt:Ser/Plas:Qn:: 1.18
Troponin I.cardiac:MCnc:Pt:Ser/Plas:Qn:: <0.06

## 2018-10-15 LAB — HEMATOCRIT: Lab: 33.6 — ABNORMAL LOW

## 2018-10-15 LAB — CBC W/ AUTO DIFF
BASOPHILS ABSOLUTE COUNT: 0.1 10*9/L (ref 0.0–0.1)
BASOPHILS RELATIVE PERCENT: 0.7 %
EOSINOPHILS ABSOLUTE COUNT: 0.2 10*9/L (ref 0.0–0.4)
EOSINOPHILS RELATIVE PERCENT: 2.1 %
HEMATOCRIT: 33.8 % — ABNORMAL LOW (ref 41.0–53.0)
HEMOGLOBIN: 10.6 g/dL — ABNORMAL LOW (ref 13.5–17.5)
LARGE UNSTAINED CELLS: 2 % (ref 0–4)
LYMPHOCYTES ABSOLUTE COUNT: 2.1 10*9/L (ref 1.5–5.0)
LYMPHOCYTES RELATIVE PERCENT: 19.1 %
MEAN CORPUSCULAR HEMOGLOBIN CONC: 31.3 g/dL (ref 31.0–37.0)
MEAN CORPUSCULAR VOLUME: 83.5 fL (ref 80.0–100.0)
MEAN PLATELET VOLUME: 8.2 fL (ref 7.0–10.0)
MONOCYTES ABSOLUTE COUNT: 0.5 10*9/L (ref 0.2–0.8)
MONOCYTES RELATIVE PERCENT: 4.8 %
NEUTROPHILS ABSOLUTE COUNT: 7.7 10*9/L — ABNORMAL HIGH (ref 2.0–7.5)
NEUTROPHILS RELATIVE PERCENT: 71.6 %
PLATELET COUNT: 254 10*9/L (ref 150–440)
RED CELL DISTRIBUTION WIDTH: 18.7 % — ABNORMAL HIGH (ref 12.0–15.0)
WBC ADJUSTED: 10.7 10*9/L (ref 4.5–11.0)

## 2018-10-15 LAB — CBC
HEMATOCRIT: 33.6 % — ABNORMAL LOW (ref 41.0–53.0)
HEMOGLOBIN: 10.7 g/dL — ABNORMAL LOW (ref 13.5–17.5)
MEAN CORPUSCULAR HEMOGLOBIN CONC: 31.8 g/dL (ref 31.0–37.0)
MEAN CORPUSCULAR HEMOGLOBIN: 26.6 pg (ref 26.0–34.0)
MEAN PLATELET VOLUME: 7.4 fL (ref 7.0–10.0)
PLATELET COUNT: 240 10*9/L (ref 150–440)
RED BLOOD CELL COUNT: 4.01 10*12/L — ABNORMAL LOW (ref 4.50–5.90)
RED CELL DISTRIBUTION WIDTH: 18.7 % — ABNORMAL HIGH (ref 12.0–15.0)
WBC ADJUSTED: 9.6 10*9/L (ref 4.5–11.0)

## 2018-10-15 LAB — EGFR CKD-EPI NON-AA MALE: Lab: 17 — ABNORMAL LOW

## 2018-10-15 LAB — MAGNESIUM
MAGNESIUM: 1.9 mg/dL (ref 1.6–2.2)
Magnesium:MCnc:Pt:Ser/Plas:Qn:: 1.9

## 2018-10-15 LAB — PROTIME: Lab: 11

## 2018-10-15 LAB — HEPARIN CORRELATION: Lab: 0.2

## 2018-10-15 LAB — MEAN CORPUSCULAR HEMOGLOBIN: Lab: 26.1

## 2018-10-15 LAB — CREATININE: Creatinine:MCnc:Pt:Ser/Plas:Qn:: 4.04 — ABNORMAL HIGH

## 2018-10-15 LAB — PROTIME-INR: PROTIME: 11 s (ref 10.2–13.1)

## 2018-10-15 LAB — APTT: APTT: 30.8 s (ref 25.3–37.1)

## 2018-10-15 NOTE — Unmapped (Signed)
Emergency Department Provider Note        ED Clinical Impression     Final diagnoses:   Chest pain, unspecified type (Primary)   Elevated troponin   Neck pain on left side       ED Assessment/Plan     Jeremy Oliver is a 48 y.o. male with a PMH of asthma, a-fib and flutter (currently on xarelto), CHF (EF 40-45% on 06/02/2018), STEMI in 2018 with [place] arrest (s/p ICD), CAD s/p multiple stent placement, CKD stage III, chronic pain syndrome, DM, GERD, HLD, HTN, nephropathy, OSA, TIA, seizure-like activity, and homelessness who presents for 4 hours of neck discomfort and chest pain, both temporarily improved with NTG. Upon obtaining EKG, patient had an episode of unconsciousness with seizure-like activity.     On exam, the patient is uncomfortable appearing but in NAD. His vitals are WNL and he is afebrile. HRRR, lungs CTAB, and normal work of breathing. Abdomen is morbidly obese but soft, nontender, and nondistended. No BLE edema. Dry skin. Nonfocal neuro exam.     EKG shows NSR with rate of 84. LVH with repolarization abnormality. QTc 470. Lateral T wave inversions that are not new. Plan for labs including troponin and coags.    Initial troponin is negative.  Pt reports his wife is having knee surgery in the am, and would like to be able to go home with her.  He wishes to go home if his second troponin is negative.  He reports his neck pain is better, but occasionally will get waves of neck pain and chest pain.    4:19 AM  Second troponin slightly elevated at 0.080.  Pt sleeping, did have some increase in pain earlier, but reports better after tylenol and robaxin    4:35 AM  D/w cards fellow, feels patient is stable for monitoring here, with cards consult in the am.        History     Chief Complaint   Patient presents with   ??? Chest Pain     HPI     Jeremy Oliver is a 48 y.o. male with a PMH of asthma, a-fib and flutter (currently on xarelto), CHF (EF 40-45% on 06/02/2018), STEMI in 2018 with [place] arrest (s/p ICD), CAD s/p multiple stent placement, s/p pacemaker placement, CKD stage III, chronic pain syndrome, DM, GERD, HLD, HTN, nephropathy, drop foot, OSA, TIA, seizure-like activity, and homelessness who presents for neck discomfort. Per chart review, the patient was d/c from [facility] on 7/02 after being treated for an NSTEMI. Patient was found to be a very poor candidate for repeat L heart cath given advanced CKD and likelihood of progression to dialysis. He was started on heparin gtt and monitored for 36 hours. He was d/c with Rx for atorvastatin 80 mg PO, increased imdur to 90 mg, and started on amlodipine 10 mg QD.    The patient states that for the past 4 hours, he has had intermittent L-sided neck pressure and mild chest pain. He took NTG x2 with temporary improvement of pain. Afterwards, he had nausea and vomiting that has since resolved. He called EMS but states that he did not want to go with them because they were going to take him to [name], which he reports disliking because they almost killed me last time. He denies a hx of neck pressure. He was tested negative for covid upon transfer to [facility] on 7/02. Denies known covid contacts since then. Denies BLE swelling.  Past Medical History:   Diagnosis Date   ??? Angina at rest (CMS-[name])    ??? Arthritis    ??? Asthma    ??? Atrial fibrillation and flutter (CMS-[name]) 06/08/14   ??? BMI 40.0-44.9, adult (CMS-[name]) 10/29/2015   ??? Chronic anticoagulation 02/17/2018   ??? Chronic kidney disease, stage 3 (CMS-[name])    ??? Chronic pain syndrome 04/11/2014    ~Use daily lyrica as recommended ~Tramadol as needed for pain ~Fluoxetine daily as recommended ~Daily exercise ~Eat a healthy diet ~Good control of your blood sugars can help    ??? Coronary artery disease    ??? Depression    ??? Diabetes mellitus (CMS-[name])    ??? Eye trauma 1998    glass in both eyes and removed   ??? GERD (gastroesophageal reflux disease)    ??? Gout    ??? Homeless    ??? Hyperlipidemia    ??? Hypertension    ??? Illiteracy and low-level literacy    ??? Microscopic hematuria    ??? Migraine    ??? Nephrolithiasis    ??? 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 (CMS-[name]) 05/21/2009    Take all blood pressure medications according to instructions Excellent control of your sugars and protect your kidneys Monitor for swelling of ankles or shortness of breath and let your doctor know if these develop Avoid medications that can hurt your kidneys like ibuprofen, Advil, Motrin, naproxen, Naprosyn Let your doctors know that you have chronic kidney disease as medication doses [place]ukam need t   ??? Nonalcoholic fatty liver disease    ??? NSTEMI (non-ST elevated myocardial infarction) (CMS-[name]) 01/13/2017   ??? Obesity    ??? Obstructive sleep apnea 2009   ??? Panic attacks    ??? 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 Seizure-like activity (CMS-[name])     ~Monitor for symptoms and report them to your primary care provider if they occur    ??? Systolic CHF, chronic (CMS-[name])     EF 40-45% 2017   ??? TIA (transient ischemic attack)        Past Surgical History:   Procedure Laterality Date   ??? CARDIAC CATHETERIZATION  03/08/2007   ??? CYSTOURETHROSCOPY  12/31/2009     Microscopic hematuria   ??? KNEE SURGERY Right 09/23/2006    arthroscopic knee surgery medial and lateral meniscus tears as well as crystal disease   ??? PR CATH PLACE/CORON ANGIO, IMG SUPER/INTERP,R&L HRT CATH, L HRT VENTRIC N/A 02/13/2017    Procedure: Left/Right Heart Catheterization;  Surgeon: Marlaine Hind, MD;  Location: [facility] CATH;  Service: Cardiology   ??? PR CATH PLACE/CORON ANGIO, IMG SUPER/INTERP,W LEFT HEART VENTRICULOGRAPHY N/A 03/17/2017    Procedure: Left Heart Catheterization W Intervention;  Surgeon: Marlaine Hind, MD;  Location: [facility] CATH;  Service: Cardiology   ??? PR CATH PLACE/CORON ANGIO, IMG SUPER/INTERP,W LEFT HEART VENTRICULOGRAPHY N/A 05/12/2018    Procedure: CATH LEFT HEART CATHETERIZATION W INTERVENTION;  Surgeon: Dorathy Kinsman, MD;  Location: [facility] CATH;  Service: Cardiology   ??? PR ECMO/ECLS INITIATION VENO-ARTERIAL N/A 03/19/2017    Procedure: ECMO/ECLS; INITIATION, VENO-ARTERIAL;  Surgeon: Lennie Odor, MD;  Location: MAIN OR [facility];  Service: Cardiac Surgery   ??? PR ECMO/ECLS RMVL PRPH CANNULA OPEN 6 YRS & OLDER Left 03/25/2017    Procedure: ECMO / ECLS PROVIDED BY PHYSICIAN; REMOVAL OF PERIPHERAL (ARTERIAL AND/OR VENOUS) CANNULA(E), OPEN, 6 YEARS AND OLDER;  Surgeon: Alonna Buckler Ikonomidis, MD;  Location: MAIN OR [facility];  Service: Cardiac Surgery   ???  PR EPHYS EVAL W/ ABLATION SUPRAVENT ARRHYTHMIA N/A 07/19/2015    Catheter ablation of cavotricuspid isthmus for atrial flutter [facility])   ??? PR INSER HART PACER XVENOUS ATRIAL N/A 05/30/2015    [employer] dual-chamber pacemaker implant (E.Chung)   ??? PR INSERT/PLACE FLOW DIRECT CATH N/A 03/17/2017    Procedure: Insert Leave In [place];  Surgeon: Marlaine Hind, MD;  Location: [facility] CATH;  Service: Cardiology   ??? PR INSJ/RPLCMT PERM DFB W/TRNSVNS LDS 1/DUAL CHMBR N/A 05/04/2017    Procedure: ICD Implant System (Single/Dual);  Surgeon: Eldred Manges, MD;  Location: [facility] CATH;  Service: Cardiology   ??? PR NEGATIVE PRESSURE WOUND THERAPY DME >50 SQ CM Left 03/25/2017    Procedure: Neg Press Wound Tx (Vac Assist) Incl Topicals, Per Session, Tsa Greater Than/= 50 Cm Squared;  Surgeon: Alonna Buckler Ikonomidis, MD;  Location: MAIN OR [facility];  Service: Cardiac Surgery   ??? PR PRQ TRLUML CORONARY STENT W/ANGIO ONE ART/BRNCH N/A 03/12/2018    Procedure: Percutaneous Coronary Intervention;  Surgeon: Marlaine Hind, MD;  Location: [facility] CATH;  Service: Cardiology   ??? PR REBL VES DIRECT,LOW EXTREM Left 03/19/2017    Procedure: Repr Bld Vessel Direct; Lower Extrem;  Surgeon: Boykin Reaper, MD;  Location: MAIN OR [facility];  Service: Vascular   ??? PR REMV ART CLOT ILIAC-POP,LEG INCIS Left 03/25/2017    Procedure: EMBOLECTOMY OR THROMBECTOMY, WITH OR WITHOUT CATHETER; FEMOROPOPLITEAL, AORTOILIAC ARTERY, BY LEG INCISION;  Surgeon: Alonna Buckler Ikonomidis, MD;  Location: [facility]IN [facility];  Service: Cardiac Surgery   ??? PR UPPER GI ENDOSCOPY,BIOPSY N/A 02/28/2016    Procedure: UGI ENDOSCOPY; WITH BIOPSY, SINGLE OR MULTIPLE;  Surgeon: Liane Comber, MD;  [facility];  Service: Gastroenterology   ??? PR UPPER GI ENDOSCOPY,DIAGNOSIS N/A 05/07/2016    Procedure: UGI ENDO, INCLUDE ESOPHAGUS, STOMACH, & DUODENUM &/OR JEJUNUM; DX W/WO COLLECTION SPECIMN, BY BRUSH OR WASH;  Surgeon: Modena Nunnery, MD;  [facility];  Service: Gastroenterology       Family History   Problem Relation Age of Onset   ??? Diabetes Mother    ??? Hyperlipidemia Mother    ??? Heart disease Mother    ??? Basal cell carcinoma Mother    ??? Blindness Mother         diabeties   ??? Obesity Mother    ??? Hyperlipidemia Father    ??? Heart disease Father    ??? Lung disease Father    ??? Heart disease Half-Sister    ??? Kidney disease Half-Sister    ??? Gallbladder disease Half-Brother    ??? Obesity Half-Brother    ??? No Known Problems Daughter    ??? No Known Problems Son    ??? Obesity Daughter    ??? Melanoma Neg Hx    ??? Squamous cell carcinoma Neg Hx    ??? Macular degeneration Neg Hx    ??? Glaucoma Neg Hx        Social History     Socioeconomic History   ??? Marital status: Married     Spouse name: [name]   ??? Number of children: 3   ??? Years of education: 55   ??? Highest education level: None   Occupational History   ??? Occupation: DISABLED     Comment: Former Surveyor, quantity Needs   ??? Financial resource strain: Very hard   ??? Food insecurity     Worry: None     Inability: None   ???  Transportation needs     Medical: Yes     Non-medical: Yes   Tobacco Use   ??? Smoking status: Never Smoker   ??? Smokeless tobacco: Current User     Types: Chew   ??? Tobacco comment: Pt dips 1 can a week, Pt not interested in quitting    Substance and Sexual Activity   ??? Alcohol use: Never     Alcohol/week: 0.0 standard drinks     Frequency: Never     Comment: rare use: couple times a year, small amount   ??? Drug use: No   ??? Sexual activity: Yes     Partners: Female   Lifestyle   ??? Physical activity     Days per week: None     Minutes per session: None   ??? Stress: None   Relationships   ??? Social Wellsite geologist on phone: More than three times a week     Gets together: More than three times a week     Attends religious service: 1 to 4 times per year     Active member of club or organization: No     Attends meetings of clubs or organizations: Never     Relationship status: Married   Other Topics Concern   ??? Do you use sunscreen? No   ??? Tanning bed use? No   ??? Are you easily burned? No   ??? Excessive sun exposure? No   ??? Blistering sunburns? No   Social History Narrative    Information updated:  01/08/16. Reviewed 08/12/17:        Born in [place]    Raised with both parents    Parents died w/in 6 mos of each other. His parents knew they were going to pass and told him just before.    Half sister and 2 half brothers        Married 30 yrs. Wife [name]    2 other children son and daughter, adopted out    Daughter Bridgette, bf DJ        Education - completed through 11th. 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    Former Presenter, broadcasting since age 45    Lives with wife, difficulty paying bills 12/30/17     Lives next door to daughter, husband and their grandson        05/19/18: pt moving to new home in next 5 days- after an eviction. Has Medicaid assistance again. Will be w/ wife [name], they babysit their grandson often.    06/14/18: Pt and husband living with daughter while new location to live is determined.       No current facility-administered medications for this encounter.     Current Outpatient Medications:   ???  acetaminophen (TYLENOL) 500 MG tablet, TAKE 2 TABLETS (1 000MG ) BY MOUTH EVERY 8 HOURS AS NEEDED FOR PAIN, Disp: 180 each, Rfl: 3  ???  albuterol HFA 90 mcg/actuation inhaler, Inhale 2 puffs into the lungs every 6 (six) hours as needed for wheezing or shortness of breath., Disp: 18 g, Rfl: 2  ???  alirocumab (PRALUENT) 150 mg/mL subcutaneous injection, Inject 1 mL (150 mg total) under the skin every fourteen (14) days., Disp: 4 Syringe, Rfl: 11  ???  allopurinoL (ZYLOPRIM) 100  MG tablet, Take 0.5 tablets (50 mg total) by mouth daily., Disp: 45 tablet, Rfl: 3  ???  amiodarone (PACERONE) 200 MG tablet, TAKE 1 TABLET BY MOUTH ONCE DAILY, Disp: 90 tablet, Rfl: 3  ???  amLODIPine (NORVASC) 10 MG tablet, Take 1 tablet (10 mg total) by mouth daily., Disp: 90 tablet, Rfl: 3  ???  arm brace Misc, 1 each by Miscellaneous route daily., Disp: 1 each, Rfl: 0  ???  atorvastatin (LIPITOR) 80 MG tablet, Take 1 tablet (80 mg total) by mouth daily., Disp: 90 tablet, Rfl: 3  ???  blood sugar diagnostic Strp, USE TO CHECK BLOOD SUGAR 4 TIMES DAILY (BEFORE MEALS AND NIGHTLY), Disp: 100 each, Rfl: 3  ???  blood-glucose meter kit, Use to check blood sugars, #1 meter, #100 strips. Freestyle glucose meter. ICD-9 250.00, Disp: 1 each, Rfl: 0  ???  bumetanide (BUMEX) 2 MG tablet, Take 1 tablet (2 mg total) by mouth daily., Disp: 90 tablet, Rfl: 3  ???  carvediloL (COREG) 25 MG tablet, TAKE 1 TABLET BY MOUTH WITH THE MORNING MEAL AND TAKE 2 TABLETS WITH THE EVENING MEAL, Disp: 270 tablet, Rfl: 3  ???  cholecalciferol, vitamin D3, 5,000 unit capsule, Take 1 capsule (5,000 Units total) by mouth daily., Disp: 100 capsule, Rfl: 0  ???  diclofenac sodium (VOLTAREN) 1 % gel, Apply 2 grams topically Three (3) times a day., Disp: 100 g, Rfl: 11  ???  empty container Misc, Use as directed to dispose of injectable medications, Disp: 1 each, Rfl: 3  ???  ezetimibe (ZETIA) 10 mg tablet, TAKE 1 TABLET BY MOUTH ONCE DAILY, Disp: 90 each, Rfl: 3  ???  FLUoxetine (PROZAC) 40 MG capsule, Take 2 capsules (80 mg total) by mouth daily., Disp: 180 capsule, Rfl: 3  ???  gabapentin (NEURONTIN) 300 MG capsule, Take 1 capsule (300 mg total) by mouth two (2) times a day., Disp: 180 capsule, Rfl: 3  ???  insulin ASPART (NOVOLOG FLEXPEN U-100 INSULIN) 100 unit/mL (3 mL) injection pen, Inject 0.2 mL (20 Units total) under the skin three (3) times a day with a meal., Disp: 15 mL, Rfl: 11  ???  insulin detemir U-100 (LEVEMIR) 100 unit/mL (3 mL) injection pen, Inject 0.4 mL (40 Units total) under the skin nightly., Disp: 15 mL, Rfl: 3  ???  insulin syringe-needle U-100 0.3 mL 31 gauge x 5/16 Syrg, Use to inject insulin twice daily with meals, Disp: 60 each, Rfl: 0  ???  isosorbide mononitrate (IMDUR) 30 MG 24 hr tablet, Take 3 tablets (90 mg total) by mouth daily., Disp: 90 tablet, Rfl: 0  ???  nitroglycerin (NITROSTAT) 0.4 MG SL tablet, Place 1 tablet (0.4 mg total) under the tongue every five (5) minutes as needed for chest pain., Disp: 100 each, Rfl: 3  ???  omeprazole (PRILOSEC) 40 MG capsule, Take 1 capsule (40 mg total) by mouth Two (2) times a day (30 minutes before a meal)., Disp: 180 capsule, Rfl: 3  ???  ondansetron (ZOFRAN) 4 MG tablet, Take 1 tablet (4 mg total) by mouth every eight (8) hours as needed for nausea for up to 4 days., Disp: 10 tablet, Rfl: 0  ???  QUEtiapine (SEROQUEL) 50 MG tablet, Take 1 tablet (50 mg total) by mouth two (2) times a day as needed (headache)., Disp: 180 tablet, Rfl: 3  ???  rivaroxaban (XARELTO) 15 mg Tab, Take 1 tablet (15 mg total) by mouth daily with evening meal., Disp: 90 tablet, Rfl: 3  ???  semaglutide 0.25 mg or 0.5 mg(2 mg/1.5 mL) PnIj, Inject 0.5 mg under the skin every seven (7) days., Disp: 4.5 mL, Rfl: 3  ???  sevelamer (RENVELA) 800 mg tablet, TAKE 2 TABLETS (1600MG ) BY MOUTH THREE TIMES DAILY WITH MEALS, Disp: 540 tablet, Rfl: 3  ???  sodium polystyrene, SPS, with sorbitol (SPS, WITH SORBITOL,) 15-20 gram/60 mL Susp, Take 60 mL (15 g) by mouth once a week., Disp: 473 mL, Rfl: 3  ???  ticagrelor (BRILINTA) 90 mg Tab, TAKE 1 TABLET BY MOUTH TWICE DAILY, Disp: 180 tablet, Rfl: 3  ???  UNIFINE PENTIPS 31 gauge x 5/16 Ndle, USE WITH INSULIN AND VICTOZA 5 TIMES DAILY, Disp: 400 each, Rfl: 3         10+ point review of systems negative except as detailed in HPI.       Physical Exam     BP 161/84  - Temp 36.9 ??C (98.4 ??F) (Oral)  - Resp 16  - SpO2 95%     Physical Exam  Vitals signs and nursing note reviewed.   Constitutional:       General: He is not in acute distress.     Appearance: He is well-developed.      Comments: Uncomfortable appearing   HENT:      Head: Normocephalic and atraumatic.      Nose: Nose normal.      Mouth/Throat:      Pharynx: No oropharyngeal exudate.   Eyes:      Conjunctiva/sclera: Conjunctivae normal.      Pupils: Pupils are equal, round, and reactive to light.   Neck:      Musculoskeletal: Normal range of motion and neck supple.      Thyroid: No thyromegaly.      Vascular: No JVD.      Trachea: No tracheal deviation.   Cardiovascular:      Rate and Rhythm: Normal rate and regular rhythm.      Heart sounds: Normal heart sounds. No murmur. No friction rub. No gallop.    Pulmonary:      Effort: Pulmonary effort is normal. No respiratory distress.      Breath sounds: Normal breath sounds. No stridor. No wheezing or rales.   Chest:      Chest wall: No tenderness.   Abdominal:      General: Bowel sounds are normal. There is no distension.      Palpations: Abdomen is soft. There is no mass.      Tenderness: There is no abdominal tenderness. There is no guarding or rebound.      Comments: Morbidly obese   Musculoskeletal: Normal range of motion.         General: No tenderness.   Lymphadenopathy:      Cervical: No cervical adenopathy.   Skin:     General: Skin is warm and dry.      Coloration: Skin is not pale.      Findings: No erythema or rash.   Neurological:      Mental Status: He is alert and oriented to person, place, and time.      Motor: No abnormal muscle tone.      Coordination: Coordination normal.      Deep Tendon Reflexes: Reflexes are normal and symmetric.   Psychiatric:         Behavior: Behavior normal.         Thought Content: Thought content normal. Judgment: Judgment normal.  ED Course           MDM  Reviewed: vitals, previous chart and nursing note  Interpretation: labs, ECG and x-ray  Consults: admitting MD and cardiology           Documentation assistance was provided by Merwyn Katos, Scribe on October 15, 2018 at 12:02 AM for Marisa Severin, MD.  October 15, 2018 4:42 AM. Documentation assistance provided by the scribe. I was present during the time the encounter was recorded. The information recorded by the scribe was done at my direction and has been reviewed and validated by me.        Desma Mcgregor, MD  10/15/18 [phone]

## 2018-10-15 NOTE — Unmapped (Signed)
Pt neurologically oriented xs 4.  No c/o of pain.  Mostly independent with all ADLs; minimal motor deficits.  Lung sounds clear; on nasal cannula only when asleep for desats in low 90s.  Hemodynamically, he is atrial paced; monitor reflects NSR with HRs 70s.  B/Ps stable.  Will start heparin infusion ; q6hr PTTs for nomogram titration.  Plans for stress test at main campus on Monday.  Voids without difficulty.  Bowel sounds present; no BM thus far.  Appetite excellent.  Will continue to monitor.    Problem: Adult Inpatient Plan of Care  Goal: Plan of Care Review  Outcome: Ongoing - Unchanged  Flowsheets (Taken 10/15/2018 1613)  Progress: no change  Plan of Care Reviewed With:   patient   spouse  Goal: Patient-Specific Goal (Individualization)  Outcome: Ongoing - Unchanged  Flowsheets (Taken 10/15/2018 1613)  Patient-Specific Goals (Include Timeframe):   No falls by discharge   resolution of cardiac complications.  Individualized Care Needs:   Continuous telemetry monitoring, falls precations   blood sugar management  Anxieties, Fears or Concerns: Pt states he wants to eat if there are no tests today.     Problem: Diabetes Comorbidity  Goal: Blood Glucose Level Within Desired Range  Outcome: Ongoing - Unchanged  Variance Clinical complication     Problem: Heart Failure Comorbidity  Goal: Maintenance of Heart Failure Symptom Control  Outcome: Ongoing - Unchanged  Variance Clinical complication     Problem: Arrhythmia/Dysrhythmia (Heart Failure)  Goal: Stable Heart Rate and Rhythm  Outcome: Ongoing - Unchanged  Variance Clinical complication  Impact: Moderate     Problem: Functional Ability Impaired (Heart Failure)  Goal: Optimal Functional Ability  Outcome: Ongoing - Unchanged  Variance Clinical complication  Impact: Moderate     Problem: Fall Injury Risk  Goal: Absence of Fall and Fall-Related Injury  Outcome: Progressing

## 2018-10-15 NOTE — Unmapped (Signed)
[facility] Cardiology Transfer Center Request Note    RE: Jeremy Oliver (MRN: [id number])    4:24 AM  10/15/18    Requesting Provider: [facility]: [facility]  Requesting Service: ER    Reason for Consult: Elevated troponin    Brief Hospital Course:   [name] is a 48 year old man with a CAD s/p multiple PCIs, HFrEF EF 40-45%, AFL/AF on [facility] [place] arrest with STEMI 2018 s/p ICD placement on Amiodarone, DM2, GERD, HLD, HTN, morbid obesity, neuropathy, gastroparesis, TIA, CKD, homelessness who presented to the [facility] ER with chest and neck pain.  His chest pain improved with nitroglycerin but neck pain has persisted.  His neck is tender to palpation.  His ECG is unchanged from ECG on 7/1. His initial troponin was not detectable but his second troponin was 0.08. He does not appear to be in decompensated heart failure based on history or exam. He was notably recently admitted to [facility] and discharged on 7/2 for chest pain with elevated troponins which were fairly mild.  He was deemed a poor candidate for LHC given his advanced CKD and progression to dialysis.  He himself stated he preferred to avoid dialysis if at all possible.  As a result he was medically managed and discharged on maximal medical therapy.  He last underwent LHC 05/2018 where he received PCI to the mLAD for a 90% ISR of a prior stent.  He was noted to have subsequent impaired flow at an adjacent moderate diagonal, which was small and had pre-existing ostial disease. This was treated with balloon angioplasty with restored flow.  His right coronary artery had patent stents and minimal disease otherwise.    The provider was questioning the need for possible transfer to [facility] vs admission to [facility].  I advised that admission to [facility] would be reasonable at this time given the results of his recent admission.  The patient is currently asleep and chest pain free with a modestly elevated troponin at a lower level than prior admissions.  He can be reassessed in the AM with assistance with cardiology at [facility] and then a determination can be made about pursuing LHC if the patient wishes vs stress testing to evaluate his ischemic burden.  A further determination could also be made at that time about transfer to [facility] for further management.  They were in agreement with this plan.  I advised that they contact me with further questions or concerns regarding this unfortunate gentleman.    Lourdes Sledge, MD  Fellow, Cardiovascular Medicine, PGY5

## 2018-10-15 NOTE — Unmapped (Addendum)
Pt presents with chest pain beginning at home after eating.  Pt called EMS who arrived but pt refused transport with them going to [place] and presents here.  Pt also endorse SOB, nausea with emesis, neck pain, and right shoulder pain.  Pt became unresponsive with rapid eye movements during triage.  VS WNL and EKG showed NSR.  MD Norlene Campbell brought to bedside to assess pt.

## 2018-10-15 NOTE — Unmapped (Signed)
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)    Obesity, Class III, BMI 40-49.9 (morbid obesity) (CMS-[name])    GERD (gastroesophageal reflux disease)    Mild intermittent asthma without complication    Paroxysmal atrial fibrillation (CMS-[name])    Tobacco use disorder    NSTEMI (non-ST elevated myocardial infarction) (CMS-[name])    Chronic heart failure with reduced ejection fraction and diastolic dysfunction (CMS-[name])    Hyperkalemia    CKD (chronic kidney disease) stage 4, GFR 15-29 ml/min (CMS-[name])    Type 2 diabetes mellitus with stage 4 chronic kidney disease, with long-term current use of insulin (CMS-[name])  Resolved Problems:    * No resolved hospital problems. *          The patient is a 48 year old Caucasian male with a history of extensive coronary artery disease status post multiple stenting, hypertension, stage IV chronic kidney disease and a poor heart cath candidate who was admitted to the hospital for recurrence of chest pain and elevation in his troponin.  He is atrially paced.  Troponin initially without significant elevation at 0.08, now rising to 0.24.  Follow-up level pending.  Cardiology recommendation is to discontinue his Xarelto, start heparin drip to continue over the weekend, follow serial troponins and get stress test on Monday.    1.  CAD, manage as above.    2.  Type 2 diabetes mellitus.  Patient insists on a regular diet, he does not take his insulin as exactly prescribed, taking only Lantus in the evening and not in the morning but he does take before meal insulin.  We will continue with Lantus 30 units at bedtime slightly lower than home dose and continue AC at bedtime sliding scale, titrate as needed.    3.  Morbid obesity, he was sleeping soundly and snoring when I went in the room earlier.  He does not have a diagnosis of sleep apnea but is very likely.  Will need outpatient follow-up.    4.  Chronic kidney disease, stage IV, any procedure with contrast would be risky, patient understands that.    5.  Hyperkalemia, mild.  Continue to monitor, continue Bumex.    6.  Paroxysmal A. fib.  He is chronically on Xarelto, he will be on heparin drip over the weekend.    7.  History of heart failure, he appears euvolemic at this time.  He is in the CCU with stepdown status, will diurese oral Bumex as indicated.    *DVT prophylaxis: He is fully anticoagulated.    *Disposition: To be determined after stress test on Monday.      Please page the [facility]) pager at [phone] with questions.          Subjective:   His chest pain has resolved.  Plan of care discussed with him and he is agreeable to this.  No complaint of dyspnea.    Objective:       Vital signs in last 24 hours:  Temp:  [36.5 ??C (97.7 ??F)-36.9 ??C (98.4 ??F)] 36.7 ??C (98 ??F)  Heart Rate:  [70-76] 70  SpO2 Pulse:  [72] 72  Resp:  [11-19] 19  BP: (138-182)/(52-89) 138/52  MAP (mmHg):  [76-95] 76  SpO2:  [93 %-99 %]  96 %  BMI (Calculated):  [42.35] 42.35    Intake/Output last 24 hours:    Intake/Output Summary (Last 24 hours) at 10/15/2018 0837  Last data filed at 10/15/2018 0700  Gross per 24 hour   Intake ???   Output 275 ml   Net -275 ml         Physical Exam:    Gen: Alert, NAD, pleasant, conversant, morbidly obese.    HEENT: No icterus.    CV: Regular rate and rhythm, distant heart tones.    PULM/chest: Clear to auscultation anteriorly..    ZOX:WRUE, massive pannus, non tender, No palpable organomegaly    Ext: No C/C, trace pitting pedal edema.    Neuro: No acute focal deficits.    Skin: Warm, dry.      Medications:   Scheduled Meds:  ??? amiodarone  200 mg Oral Daily   ??? amLODIPine  10 mg Oral Daily   ??? aspirin  81 mg Oral Daily   ??? atorvastatin  80 mg Oral Daily   ??? bumetanide  2 mg Oral Daily   ??? carvediloL  25 mg Oral BID ??? FLUoxetine  80 mg Oral Daily   ??? gabapentin  300 mg Oral BID   ??? insulin glargine  20 Units Subcutaneous Once   ??? [START ON 10/16/2018] insulin glargine  40 Units Subcutaneous Nightly   ??? insulin lispro  0-12 Units Subcutaneous TID AC   ??? insulin lispro  20 Units Subcutaneous TID AC   ??? [START ON 10/16/2018] isosorbide mononitrate  120 mg Oral Daily   ??? isosorbide mononitrate  30 mg Oral Once   ??? pantoprazole  40 mg Oral BID   ??? rivaroxaban  15 mg Oral Daily   ??? sevelamer  800 mg Oral 3xd Meals   ??? ticagrelor  90 mg Oral BID     All labs from admission and going forward are reviewed.      35 minutes, over 50% spent in counseling and coordination of care.  Tawni Levy MD

## 2018-10-15 NOTE — Unmapped (Signed)
Cardiology Consult Note    Requesting Attending Physician :  Rica Koyanagi, MD  Service Requesting Consult : Hospitalist    Assessment/Plan:   1.  Non-ST elevation MI  Patient with known significant coronary artery disease and stage IV-V chronic kidney disease presents with an episode of chest pain with initial negative troponin now with a troponin of 0.2.  Patient recently underwent balloon angioplasty of in-stent restenosis of his mid to proximal LAD.  He is at high risk of having restenosis of the vessel.  This case actually represents a very difficult 1 to manage given his other significant atherosclerotic burden of his coronary arteries his troponin elevation could be a demand ischemia event simply from having some hypertension with chronic fixed coronary artery disease but restenosis of his LAD stent cannot be excluded.  Would recommend pursuing PET myocardial perfusion study for further recertification.  The study is high risk showing significant anterior ischemia then would proceed with left heart catheterization.  If however patient had a low risk study and little to no ischemia in the anterior segments  Then would likely continue medical management and defer cardiac catheterization.  Other potential high risk features that would suggest cardiac catheterization would be warranted would be markedly elevated troponin, recurrent stuttering chest pain or significant ventricular arrhythmia.  As of yet he has had none of these features and I would recommend continue cycling his of his enzymes, initiation of heparin without a bolus while stopping his Xarelto and plans for at least 48 hours of heparin.  Would recommend keeping the patient until Monday to allow PET perfusion study to occur.    History of Present Illness:     Reason for Consult:   Troponin elevation    Jeremy Oliver is a 48 y.o. male with a past medical history significant for significant coronary artery disease including in-stent restenosis which was recently balloon angioplastied in the LAD and advanced chronic kidney disease is being seen at the request of Rica Koyanagi, MD for chest pain and elevated troponin.    The patient states that last night he began having pain in his neck which radiated into his chest.  He had never had this experience before and states that he was a little caught off guard by it.  He initially was going to try to stay home but eventually some friends convinced him to call EMS once he had recurrent episode of the neck pain which radiated to his chest and was not relieved with the nitroglycerin.  [facility] came to his house but he refused to be transported by them and drove himself to the [facility] emergency department.  Initially his troponins were negative but overnight they become mildly positive.  He has not had recurrent chest pain since being admitted and no significant ventricular arrhythmia.  Patient is wary about repeat cardiac catheterization due to his kidney function.  He also affirms some shortness of breath with his chest pain episode last night.  He denied nausea vomiting or diaphoresis with the episode.    Allergies:   Allergies   Allergen Reactions   ??? Losartan      Hyperkalemia (K>6)   ??? Morphine Palpitations     Medication error: was administered 20mL instead of 2mL       Medications:   Scheduled Meds:  ??? amiodarone  200 mg Oral Daily   ??? amLODIPine  10 mg Oral Daily   ??? aspirin  81 mg Oral Daily   ???  atorvastatin  80 mg Oral Daily   ??? bumetanide  2 mg Oral Daily   ??? carvediloL  25 mg Oral BID   ??? FLUoxetine  80 mg Oral Daily   ??? gabapentin  300 mg Oral BID   ??? insulin glargine  10 Units Subcutaneous Once   ??? [START ON 10/16/2018] insulin glargine  40 Units Subcutaneous Nightly   ??? insulin lispro  0-12 Units Subcutaneous ACHS   ??? [START ON 10/16/2018] isosorbide mononitrate  120 mg Oral Daily   ??? pantoprazole  40 mg Oral BID   ??? sevelamer  800 mg Oral 3xd Meals   ??? ticagrelor  90 mg Oral BID Continuous Infusions:  PRN Meds:.acetaminophen, albuterol, aluminum-magnesium hydroxide-simethicone, calcium carbonate, dextrose 50 % in water (D50W), guaiFENesin, melatonin, nitroglycerin, ondansetron **OR** ondansetron, polyethylene glycol, QUEtiapine, senna    Medical History:  Past Medical History:   Diagnosis Date   ??? Angina at rest (CMS-[name])    ??? Arthritis    ??? Asthma    ??? Atrial fibrillation and flutter (CMS-[name]) 06/08/14   ??? BMI 40.0-44.9, adult (CMS-[name]) 10/29/2015   ??? Chronic anticoagulation 02/17/2018   ??? Chronic kidney disease, stage 3 (CMS-[name])    ??? Chronic pain syndrome 04/11/2014    ~Use daily lyrica as recommended ~Tramadol as needed for pain ~Fluoxetine daily as recommended ~Daily exercise ~Eat a healthy diet ~Good control of your blood sugars can help    ??? Coronary artery disease    ??? Depression    ??? Diabetes mellitus (CMS-[name])    ??? Eye trauma 1998    glass in both eyes and removed   ??? GERD (gastroesophageal reflux disease)    ??? Gout    ??? Homeless    ??? Hyperlipidemia    ??? Hypertension    ??? Illiteracy and low-level literacy    ??? Microscopic hematuria    ??? Migraine    ??? Nephrolithiasis    ??? 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 Nonalcoholic fatty liver disease    ??? NSTEMI (non-ST elevated myocardial infarction) (CMS-[name]) 01/13/2017   ??? Obesity    ??? Obstructive sleep apnea 2009   ??? Panic attacks    ??? 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 Seizure-like activity (CMS-[name])     ~Monitor for symptoms and report them to your primary care provider if they occur    ??? Systolic CHF, chronic (CMS-[name])     EF 40-45% 2017   ??? TIA (transient ischemic attack)        Social History:  Social History     Tobacco Use   Smoking Status Never Smoker   Smokeless Tobacco Current User   ??? Types: Chew   Tobacco Comment    Pt dips 1 can a week, Pt not interested in quitting      Social History     Substance and Sexual Activity   Alcohol Use Never   ??? Alcohol/week: 0.0 standard drinks   ??? Frequency: Never    Comment: rare use: couple times a year, small amount     Social  History     Substance and Sexual Activity   Drug Use No       Family History:  Family History   Problem Relation Age of Onset   ??? Diabetes Mother    ??? Hyperlipidemia Mother    ??? Heart disease Mother    ??? Basal cell carcinoma Mother    ??? Blindness Mother         diabeties   ??? Obesity Mother    ??? Hyperlipidemia Father    ??? Heart disease Father    ??? Lung disease Father    ??? Heart disease Half-Sister    ??? Kidney disease Half-Sister    ??? Gallbladder disease Half-Brother    ??? Obesity Half-Brother    ??? No Known Problems Daughter    ??? No Known Problems Son    ??? Obesity Daughter    ??? Melanoma Neg Hx    ??? Squamous cell carcinoma Neg Hx    ??? Macular degeneration Neg Hx    ??? Glaucoma Neg Hx        Code Status:  Full Code    Review of Systems: 12 point review negative except for stated above    Objective:     Physical Exam:  VITAL SIGNS: Temp:  [36.5 ??C (97.7 ??F)-36.9 ??C (98.4 ??F)] 36.5 ??C (97.7 ??F)  Heart Rate:  [70-76] 73  SpO2 Pulse:  [70-72] 70  Resp:  [11-19] 17  BP: (97-182)/(43-89) 108/67  SpO2:  [86 %-100 %] 98 %  GENERAL:  NAD   HEENT:  AT/[place], sclerae anicteric, OP clear  NECK: Unable to assess jugular venous pulsation due to neck pannus.  CARDIOVASCULAR: Regular rhythm and rate without murmur rub or gallop  RESPIRATORY:  Clear to auscultation bilaterally.  No wheezes,  crackles, or rhonchi.  ABDOMEN:  Positive bowel sounds, soft, nontender, nondistended.  EXTREMITIES: No clubbing cyanosis with trace nonpitting edema to the ankles bilaterally. NEUROLOGIC:  Nonfocal.  Skin: No rashes      Test Results  ECG: Shows atrial paced rhythm with LVH and repolarization abnormality.  Echocardiogram: Most recent is from March 2020 which showed an EF of 40 to 45% with anterior wall motion abnormality.  Cardiac catheterization: From May 12, 2018 showed diffuse moderate to severe coronary disease with a 90% proximal mid LAD in-stent restenosis which underwent successful PCI with placement of synergy drug-eluting stent to the vessel, moderate diagonal ostial lesion which was worsened with stent placement.  He was treated with balloon angioplasty.  Widely patent stent to the PDA/PL bifurcation and LVEDP of 8.    Labs:   Lab Results   Component Value Date    CKTOTAL 45.0 (L) 05/25/2015    CKTOTAL 44.0 (L) 08/14/2014    CKTOTAL 40 (L) 09/15/2012    CKMB 0.54 05/25/2015    CKMB 0.52 08/14/2014    CKMB <0.2 09/15/2012    TROPONINI 0.240 (HH) 10/15/2018    TROPONINI 0.080 (HH) 10/15/2018    TROPONINI <0.060 10/15/2018     Lab Results   Component Value Date    WBC 9.6 10/15/2018    HGB 10.7 (L) 10/15/2018    HCT 33.6 (L) 10/15/2018    PLT 240 10/15/2018       Lab Results   Component Value Date    NA 141 10/15/2018    K 5.2 (H) 10/15/2018    CL 110 (H) 10/15/2018    CO2 23.0 10/15/2018    BUN 41 (H) 10/15/2018    CREATININE  3.87 (H) 10/15/2018    GLU 122 10/15/2018    CALCIUM 8.7 10/15/2018    MG 1.9 10/15/2018    PHOS 4.2 09/30/2018       Lab Results   Component Value Date    BILITOT 0.1 09/28/2018    BILIDIR 0.50 (H) 04/07/2017    PROT 6.5 09/28/2018    ALBUMIN 3.2 (L) 09/28/2018    ALT 11 09/28/2018    AST 15 (L) 09/28/2018    ALKPHOS 73 09/28/2018    GGT 32 01/09/2012       Lab Results   Component Value Date    PT 11.0 10/15/2018    INR 0.96 10/15/2018    APTT 39.0 09/30/2018       Lab Results   Component Value Date    INR 0.96 10/15/2018    INR 1.00 09/28/2018    INR 0.99 09/22/2018

## 2018-10-15 NOTE — Unmapped (Signed)
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)    Obesity, Class III, BMI 40-49.9 (morbid obesity) (CMS-[name])    GERD (gastroesophageal reflux disease)    Mild intermittent asthma without complication    Paroxysmal atrial fibrillation (CMS-[name])    Tobacco use disorder    NSTEMI (non-ST elevated myocardial infarction) (CMS-[name])    Chronic heart failure with reduced ejection fraction and diastolic dysfunction (CMS-[name])    Hyperkalemia    CKD (chronic kidney disease) stage 4, GFR 15-29 ml/min (CMS-[name])    Type 2 diabetes mellitus with stage 4 chronic kidney disease, with long-term current use of insulin (CMS-[name])      Jeremy Oliver is a 48 y.o. male with CKDIII, DM, prior STEMI (2018), multiple prior PCI with ischemic cardiomyopathy and recent NSTEMI admitted with cardiac chest pain but without concern for ACS.     Chest pain with recent NSTEMI (09/28/2018) -history of STEMI 2018 with V. tach arrest status post ICD, DES to OM LAD. Prior DES to mid RCA RPLA/RPDA bifurcation.??Last LHC ??05/2018??w/??PCI to proximal/mid LAD.  Admission 6/30 - 7/2 for NSTEMI with mildly elevated troponin peak 0.40 EKG with new T wave inversions V4 through V6.  At that admission the patient was thought to be a poor candidate for repeat left heart cath given advanced CKD and likelihood of progression to dialysis.  Patient strongly stated he would not like to initiate dialysis.  He was monitored on a heparin drip for 36 hours and discharged home with increased Imdur and a new amlodipine prescription  ???Continue atorvastatin 80 p.o. daily  ???Continue Coreg 25 every morning and 50 every afternoon (home regimen)  ???Ticagrelor 90 twice daily  --Imdur 90 mg daily, amlodipine 10 mg daily  -- troponin 0.06, 0.080, repeat 0600   -- EKG with TWI V4-6 which have been present on prior EKG, normal sinus   -- hold on heparin infusion given minimal change troponin, resolution chest pain, no EKG changes.  ED spoke with cardiology who did not feel like heparin infusion warranted at this time given EKG without changes and chest pain resolved.  With known significant CAD the patient Perfect continue to have chest pain intermittently      Ischemic cardiomyopathy with reduced ejection fraction (TTE 3/20 EF 40 to 45%)  ??? Continue home beta-blocker, statin  ???Continue Bumex 2 mgs daily  ??? Absence of dyspnea shortness of breath on this admission     Hyperkalemia 5.5, appears to frequently be elevated likely due to CKD  ??? Hopefully will improve with Bumex    Chronic Medical Issues   paroxysmal A. fib-- Remains on carvedilol, amiodarone 200 mg daily, Xarelto 15 mg daily  GERD??? Prilosec 40 mg twice daily  Diabetes with gastroparesis  --Levemir 40 mg daily, NovoLog 20 units 3 times daily, HA1c 7/13 7.9; gabapentin 300 mg po BID   CKD IV -at baseline which is 3.7-4.0, 4.04 on admission, renvela with meals   Adjustment Disorder/MDD - continue fluoxetine 80 mg po daily (substitude alternative), Seroquel   Code Status:  Full Code   Diet. Cardiac  DVT Px. On full AC   Disposition: observation for chest pain, [place] be able to DC tomorrow (living with friend in [place] who can pick him up upon DC).  His wife is having knee surgery  tomorrow     Irine Seal, MD MPH  Division of Hospital Medicine  Pager: [phone]    ___________________________________________________________________    Chief Complaint  Chief Complaint   Patient presents with   ??? Chest Pain       HPI:  Jeremy Oliver is a 48 y.o. male history of asthma, A. fib/flutter (on Xarelto)  , Ischemic cardiomyopathy with reduced EF (EF 40 to 45% 05/2018) with prior STEMI 2018 associated with V. tach arrest, prior 6 PCI, stage IVCKD, chronic pain syndrome, nephropathy  Who presents to care with 4 hours of chest discomfort radiating up to his neck with mild improvement with nitroglycerin.  The patient reports the pain lasted for 4 hours, but improved temporarily with nitroglycerin at home.  He had an episode of nausea with vomiting which has since resolved.   He denies dyspnea shortness of breath or lower extremity edema.  The patient has a history of seizure-like activity and prior TIA.  While emergency department was obtaining an EKG he had a brief episode of loss of consciousness with seizure-like activity, which resolved with no intervention other than sternal rub.  He had another episode in front of the ED provider which resolved with verbal reassurance.     The patient was recently admitted for NSTEMI 6/30 - 7/2 (peak trop 0.4) but was found to be a poor candidate for left heart cath given advanced CKD and concern for progression to dialysis coronary artery radiography was performed.  He was discharged home on atorvastatin 80 mg p.o. daily, Imdur 90 mg daily, amlodipine 10 mg daily in addition to his home medications.     Currently living with a friend in [place] with his wife and pet dog.     Allergies:  Losartan and Morphine    Medications:   Prior to Admission medications    Medication Dose, Route, Frequency   acetaminophen (TYLENOL) 500 MG tablet TAKE 2 TABLETS (1 000MG ) BY MOUTH EVERY 8 HOURS AS NEEDED FOR PAIN   albuterol HFA 90 mcg/actuation inhaler Inhale 2 puffs into the lungs every 6 (six) hours as needed for wheezing or shortness of breath.   alirocumab (PRALUENT) 150 mg/mL subcutaneous injection 150 mg, Subcutaneous, Every 14 days   allopurinoL (ZYLOPRIM) 100 MG tablet 50 mg, Oral, Daily (standard)   amiodarone (PACERONE) 200 MG tablet TAKE 1 TABLET BY MOUTH ONCE DAILY   amLODIPine (NORVASC) 10 MG tablet 10 mg, Oral, Daily (standard)   arm brace Misc 1 each, Miscellaneous, Daily   atorvastatin (LIPITOR) 80 MG tablet 80 mg, Oral, Daily (standard)   blood sugar diagnostic Strp USE TO CHECK BLOOD SUGAR 4 TIMES DAILY (BEFORE MEALS AND NIGHTLY) blood-glucose meter kit Use to check blood sugars, #1 meter, #100 strips. Freestyle glucose meter. ICD-9 250.00   bumetanide (BUMEX) 2 MG tablet 2 mg, Oral, Daily (standard)   carvediloL (COREG) 25 MG tablet TAKE 1 TABLET BY MOUTH WITH THE MORNING MEAL AND TAKE 2 TABLETS WITH THE EVENING MEAL   cholecalciferol, vitamin D3, 5,000 unit capsule 5,000 Units, Oral, Daily (standard)   diclofenac sodium (VOLTAREN) 1 % gel Apply 2 grams topically Three (3) times a day.   empty container Misc Use as directed to dispose of injectable medications   ezetimibe (ZETIA) 10 mg tablet TAKE 1 TABLET BY MOUTH ONCE DAILY   FLUoxetine (PROZAC) 40 MG capsule 80 mg, Oral, Daily (standard)   gabapentin (NEURONTIN) 300 MG capsule 300 mg, Oral, 2 times a day   insulin ASPART (  NOVOLOG FLEXPEN U-100 INSULIN) 100 unit/mL (3 mL) injection pen Inject 0.2 mL (20 Units total) under the skin three (3) times a day with a meal.   insulin detemir U-100 (LEVEMIR) 100 unit/mL (3 mL) injection pen 40 Units, Subcutaneous, Nightly   insulin syringe-needle U-100 0.3 mL 31 gauge x 5/16 Syrg Use to inject insulin twice daily with meals   isosorbide mononitrate (IMDUR) 30 MG 24 hr tablet 90 mg, Oral, Daily (standard)   nitroglycerin (NITROSTAT) 0.4 MG SL tablet 0.4 mg, Sublingual, Every 5 min PRN   omeprazole (PRILOSEC) 40 MG capsule 40 mg, Oral, 2 times a day (AC)   ondansetron (ZOFRAN) 4 MG tablet 4 mg, Oral, Every 8 hours PRN   QUEtiapine (SEROQUEL) 50 MG tablet 50 mg, Oral, 2 times a day PRN   rivaroxaban (XARELTO) 15 mg Tab 15 mg, Oral, Daily   semaglutide 0.25 mg or 0.5 mg(2 mg/1.5 mL) PnIj 0.5 mg, Subcutaneous, Every 7 days   sevelamer (RENVELA) 800 mg tablet TAKE 2 TABLETS (1600MG ) BY MOUTH THREE TIMES DAILY WITH MEALS   sodium polystyrene, SPS, with sorbitol (SPS, WITH SORBITOL,) 15-20 gram/60 mL Susp Take 60 mL (15 g) by mouth once a week.   ticagrelor (BRILINTA) 90 mg Tab TAKE 1 TABLET BY MOUTH TWICE DAILY   UNIFINE PENTIPS 31 gauge x 5/16 Ndle USE WITH INSULIN AND VICTOZA 5 TIMES DAILY   liraglutide (VICTOZA 2-PAK) 0.6 mg/0.1 mL (18 mg/3 mL) injection 0.6 mg, Subcutaneous, Daily   traZODone (DESYREL) 50 MG tablet 50 mg, Oral, Nightly       Medical History:  Past Medical History:   Diagnosis Date   ??? Angina at rest (CMS-[name])    ??? Arthritis    ??? Asthma    ??? Atrial fibrillation and flutter (CMS-[name]) 06/08/14   ??? BMI 40.0-44.9, adult (CMS-[name]) 10/29/2015   ??? Chronic anticoagulation 02/17/2018   ??? Chronic kidney disease, stage 3 (CMS-[name])    ??? Chronic pain syndrome 04/11/2014    ~Use daily lyrica as recommended ~Tramadol as needed for pain ~Fluoxetine daily as recommended ~Daily exercise ~Eat a healthy diet ~Good control of your blood sugars can help    ??? Coronary artery disease    ??? Depression    ??? Diabetes mellitus (CMS-[name])    ??? Eye trauma 1998    glass in both eyes and removed   ??? GERD (gastroesophageal reflux disease)    ??? Gout    ??? Homeless    ??? Hyperlipidemia    ??? Hypertension    ??? Illiteracy and low-level literacy    ??? Microscopic hematuria    ??? Migraine    ??? Nephrolithiasis    ??? 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 Nonalcoholic fatty liver disease    ??? NSTEMI (non-ST elevated myocardial infarction) (CMS-[name]) 01/13/2017   ??? Obesity    ??? Obstructive sleep apnea 2009   ??? Panic attacks    ??? 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 Seizure-like activity (CMS-[name])     ~Monitor for symptoms and report them to your primary care provider if  they occur    ??? Systolic CHF, chronic (CMS-[name])     EF 40-45% 2017   ??? TIA (transient ischemic attack)        Surgical History:  Past Surgical History:   Procedure Laterality Date   ??? CARDIAC CATHETERIZATION  03/08/2007   ??? CYSTOURETHROSCOPY  12/31/2009     Microscopic hematuria   ??? KNEE SURGERY Right 09/23/2006    arthroscopic knee surgery medial and lateral meniscus tears as well as crystal disease   ??? PR CATH PLACE/CORON ANGIO, IMG SUPER/INTERP,R&L HRT CATH, L HRT VENTRIC N/A 02/13/2017    Procedure: Left/Right Heart Catheterization;  Surgeon: Marlaine Hind, MD;  Location: [facility] CATH;  Service: Cardiology   ??? PR CATH PLACE/CORON ANGIO, IMG SUPER/INTERP,W LEFT HEART VENTRICULOGRAPHY N/A 03/17/2017    Procedure: Left Heart Catheterization W Intervention;  Surgeon: Marlaine Hind, MD;  Location: [facility] CATH;  Service: Cardiology   ??? PR CATH PLACE/CORON ANGIO, IMG SUPER/INTERP,W LEFT HEART VENTRICULOGRAPHY N/A 05/12/2018    Procedure: CATH LEFT HEART CATHETERIZATION W INTERVENTION;  Surgeon: Dorathy Kinsman, MD;  Location: [facility] CATH;  Service: Cardiology   ??? PR ECMO/ECLS INITIATION VENO-ARTERIAL N/A 03/19/2017    Procedure: ECMO/ECLS; INITIATION, VENO-ARTERIAL;  Surgeon: Lennie Odor, MD;  Location: MAIN OR [facility];  Service: Cardiac Surgery   ??? PR ECMO/ECLS RMVL PRPH CANNULA OPEN 6 YRS & OLDER Left 03/25/2017    Procedure: ECMO / ECLS PROVIDED BY PHYSICIAN; REMOVAL OF PERIPHERAL (ARTERIAL AND/OR VENOUS) CANNULA(E), OPEN, 6 YEARS AND OLDER;  Surgeon: Alonna Buckler Ikonomidis, MD;  Location: MAIN OR [facility];  Service: Cardiac Surgery   ??? PR EPHYS EVAL W/ ABLATION SUPRAVENT ARRHYTHMIA N/A 07/19/2015    Catheter ablation of cavotricuspid isthmus for atrial flutter [facility])   ??? PR INSER HART PACER XVENOUS ATRIAL N/A 05/30/2015    [employer] dual-chamber pacemaker implant (E.Chung)   ??? PR INSERT/PLACE FLOW DIRECT CATH N/A 03/17/2017    Procedure: Insert Leave In [place];  Surgeon: Marlaine Hind, MD;  Location: [facility] CATH;  Service: Cardiology   ??? PR INSJ/RPLCMT PERM DFB W/TRNSVNS LDS 1/DUAL CHMBR N/A 05/04/2017    Procedure: ICD Implant System (Single/Dual);  Surgeon: Eldred Manges, MD;  Location: [facility] CATH;  Service: Cardiology   ??? PR NEGATIVE PRESSURE WOUND THERAPY DME >50 SQ CM Left 03/25/2017    Procedure: Neg Press Wound Tx (Vac Assist) Incl Topicals, Per Session, Tsa Greater Than/= 50 Cm Squared;  Surgeon: Alonna Buckler Ikonomidis, MD;  Location: MAIN OR [facility];  Service: Cardiac Surgery   ??? PR PRQ TRLUML CORONARY STENT W/ANGIO ONE ART/BRNCH N/A 03/12/2018    Procedure: Percutaneous Coronary Intervention;  Surgeon: Marlaine Hind, MD;  Location: [facility] CATH;  Service: Cardiology   ??? PR REBL VES DIRECT,LOW EXTREM Left 03/19/2017    Procedure: Repr Bld Vessel Direct; Lower Extrem;  Surgeon: Boykin Reaper, MD;  Location: MAIN OR [facility];  Service: Vascular   ??? PR REMV ART CLOT ILIAC-POP,LEG INCIS Left 03/25/2017    Procedure: EMBOLECTOMY OR THROMBECTOMY, WITH OR WITHOUT CATHETER; FEMOROPOPLITEAL, AORTOILIAC ARTERY, BY LEG INCISION;  Surgeon: Alonna Buckler Ikonomidis, MD;  Location: MAIN OR [facility];  Service: Cardiac Surgery   ??? PR UPPER GI ENDOSCOPY,BIOPSY N/A 02/28/2016    Procedure: UGI ENDOSCOPY; WITH BIOPSY, SINGLE OR MULTIPLE;  Surgeon: Liane Comber, MD;  [facility]: [facility];  Service: Gastroenterology   ??? PR UPPER GI ENDOSCOPY,DIAGNOSIS N/A 05/07/2016    Procedure: UGI ENDO, INCLUDE ESOPHAGUS, STOMACH, & DUODENUM &/OR JEJUNUM; DX  W/WO COLLECTION SPECIMN, BY BRUSH OR WASH;  Surgeon: Modena Nunnery, MD;  [facility]: [facility];  Service: Gastroenterology       Social History:  Tobacco use:   reports that he has never smoked. His smokeless tobacco use includes chew.  Alcohol use:   reports no history of alcohol use.  Drug use:  reports no history of drug use.  Living situation: the patient friend.    Family History:  Family History   Problem Relation Age of Onset   ??? Diabetes Mother    ??? Hyperlipidemia Mother    ??? Heart disease Mother    ??? Basal cell carcinoma Mother    ??? Blindness Mother         diabeties   ??? Obesity Mother    ??? Hyperlipidemia Father    ??? Heart disease Father    ??? Lung disease Father    ??? Heart disease Half-Sister    ??? Kidney disease Half-Sister    ??? Gallbladder disease Half-Brother    ??? Obesity Half-Brother    ??? No Known Problems Daughter    ??? No Known Problems Son    ??? Obesity Daughter    ??? Melanoma Neg Hx    ??? Squamous cell carcinoma Neg Hx    ??? Macular degeneration Neg Hx    ??? Glaucoma Neg Hx        Review of Systems:  10 systems reviewed and are negative unless otherwise mentioned in HPI      Physical Exam:  Temp:  [36.9 ??C (98.4 ??F)] 36.9 ??C (98.4 ??F)  Heart Rate:  [76] 76  Resp:  [11-16] 15  BP: (161-182)/(84-89) 169/88  SpO2:  [93 %-99 %] 93 %  There is no height or weight on file to calculate BMI.    General appearance - alert, well appearing, and in no distress  Eyes - Sclera anicteric, conjunctiva pink  Mouth - mucous membranes moist, pharynx normal without lesions  Neck - Trachea supple and midline, (-) JVD   Lymphatics - no palpable lymphadenopathy  Heart - normal rate, regular rhythm, normal S1, S2, no murmurs, rubs, clicks or gallops  Chest - clear to auscultation, no wheezes, rales or rhonchi, symmetric air entry  Abdomen - soft, nontender, nondistended, no masses or organomegaly  Extremities - No pedal edema, no clubbing or cyanosis  Skin - normal coloration and turgor, no rashes, no suspicious skin lesions noted  Neurological - alert, oriented, normal speech, no focal findings or movement disorder noted      Test Results:  Data Review:  I have reviewed the labs and studies from the last 24 hours.  Troponin 0 0.00, repeat 0.08 3 hours later    Imaging: Radiology studies were personally reviewed  Chest x-ray with no infiltrate, no evidence of pulmonary edema    EKG:  TWI V4-6 which have been present on prior EKG, normal sinus

## 2018-10-16 LAB — BASIC METABOLIC PANEL
ANION GAP: 7 mmol/L (ref 7–15)
BLOOD UREA NITROGEN: 39 mg/dL — ABNORMAL HIGH (ref 7–21)
BLOOD UREA NITROGEN: 41 mg/dL — ABNORMAL HIGH (ref 7–21)
BUN / CREAT RATIO: 10
BUN / CREAT RATIO: 11
CALCIUM: 8.4 mg/dL — ABNORMAL LOW (ref 8.5–10.2)
CALCIUM: 8.5 mg/dL (ref 8.5–10.2)
CHLORIDE: 110 mmol/L — ABNORMAL HIGH (ref 98–107)
CHLORIDE: 109 mmol/L — ABNORMAL HIGH (ref 98–107)
CO2: 22 mmol/L (ref 22.0–30.0)
CO2: 23 mmol/L (ref 22.0–30.0)
CREATININE: 3.74 mg/dL — ABNORMAL HIGH (ref 0.70–1.30)
CREATININE: 3.64 mg/dL — ABNORMAL HIGH (ref 0.70–1.30)
EGFR CKD-EPI AA MALE: 20 mL/min/{1.73_m2} — ABNORMAL LOW (ref >=60)
EGFR CKD-EPI AA MALE: 21 mL/min/{1.73_m2} — ABNORMAL LOW (ref >=60)
EGFR CKD-EPI NON-AA MALE: 18 mL/min/{1.73_m2} — ABNORMAL LOW (ref >=60)
EGFR CKD-EPI NON-AA MALE: 18 mL/min/{1.73_m2} — ABNORMAL LOW (ref >=60)
GLUCOSE RANDOM: 219 mg/dL — ABNORMAL HIGH (ref 70–179)
POTASSIUM: 6.1 mmol/L (ref 3.5–5.0)
POTASSIUM: 6.1 mmol/L (ref 3.5–5.0)
SODIUM: 139 mmol/L (ref 135–145)
SODIUM: 137 mmol/L (ref 135–145)

## 2018-10-16 LAB — TROPONIN I
Troponin I.cardiac:MCnc:Pt:Ser/Plas:Qn:: 0.47
Troponin I.cardiac:MCnc:Pt:Ser/Plas:Qn:: 0.56
Troponin I.cardiac:MCnc:Pt:Ser/Plas:Qn:: 1.07
Troponin I.cardiac:MCnc:Pt:Ser/Plas:Qn:: 1.17

## 2018-10-16 LAB — APTT: Coagulation surface induced:Time:Pt:PPP:Qn:Coag: 81.4 — ABNORMAL HIGH

## 2018-10-16 LAB — GLUCOSE RANDOM: Glucose:MCnc:Pt:Ser/Plas:Qn:: 255 — ABNORMAL HIGH

## 2018-10-16 LAB — CO2: Carbon dioxide:SCnc:Pt:Ser/Plas:Qn:: 23

## 2018-10-16 NOTE — Unmapped (Signed)
Continue to monitor, see notes    Problem: Adult Inpatient Plan of Care  Goal: Plan of Care Review  10/16/2018 1113 by Rise Patience, RN  Outcome: Ongoing - Unchanged  10/16/2018 1055 by Rise Patience, RN  Outcome: Ongoing - Unchanged  Goal: Patient-Specific Goal (Individualization)  10/16/2018 1113 by Rise Patience, RN  Outcome: Ongoing - Unchanged  10/16/2018 1055 by Rise Patience, RN  Outcome: Ongoing - Unchanged  Goal: Absence of Hospital-Acquired Illness or Injury  10/16/2018 1113 by Rise Patience, RN  Outcome: Ongoing - Unchanged  10/16/2018 1055 by Rise Patience, RN  Outcome: Ongoing - Unchanged  Goal: Optimal Comfort and Wellbeing  10/16/2018 1113 by Rise Patience, RN  Outcome: Ongoing - Unchanged  10/16/2018 1055 by Rise Patience, RN  Outcome: Ongoing - Unchanged  Goal: Readiness for Transition of Care  10/16/2018 1113 by Rise Patience, RN  Outcome: Ongoing - Unchanged  10/16/2018 1055 by Rise Patience, RN  Outcome: Ongoing - Unchanged  Goal: Rounds/Family Conference  10/16/2018 1113 by Rise Patience, RN  Outcome: Ongoing - Unchanged  10/16/2018 1055 by Rise Patience, RN  Outcome: Ongoing - Unchanged

## 2018-10-16 NOTE — Unmapped (Signed)
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)    Obesity, Class III, BMI 40-49.9 (morbid obesity) (CMS-[name])    GERD (gastroesophageal reflux disease)    Mild intermittent asthma without complication    Paroxysmal atrial fibrillation (CMS-[name])    Tobacco use disorder    NSTEMI (non-ST elevated myocardial infarction) (CMS-[name])    Chronic heart failure with reduced ejection fraction and diastolic dysfunction (CMS-[name])    Hyperkalemia    CKD (chronic kidney disease) stage 4, GFR 15-29 ml/min (CMS-[name])    Type 2 diabetes mellitus with stage 4 chronic kidney disease, with long-term current use of insulin (CMS-[name])     NSTEMI  The patient has significant CAD with multiple prior cardiac interventions including recent balloon angioplasty of LAD. He is admitted with chest pain and diagnosed with NSTEMI. He is awaiting PET stress test on Monday 7/20. He had a brief episode of CP but is clinically stable on my evaluation.  -Continue current medical regimen including heparin gtt  -NPO at MN on Sunday night. No caffeine after Sunday afternoon      Interval Events and Subjective:   -Troponin dowtrending after peak of 1.18 yesterday afternoon  -Experienced an episode of CP this morning that improved with NTG. ECG without any changes  -On my evaluation, the patient is resting comfortably. He denies all complaints.     Medications:   Current Facility-Administered Medications   Medication Dose Route Frequency Provider Last Rate Last Dose   ??? acetaminophen (TYLENOL) tablet 650 mg  650 mg Oral Q4H PRN Naseem Daphene Jaeger, MD       ??? albuterol 2.5 mg /3 mL (0.083 %) nebulizer solution 2.5 mg  2.5 mg Nebulization Q6H PRN Naseem Daphene Jaeger, MD       ??? aluminum-magnesium hydroxide-simethicone (MAALOX MAX) 80-80-8 mg/mL oral suspension  30 mL Oral Q4H PRN Naseem Daphene Jaeger, MD       ??? amiodarone (PACERONE) tablet 200 mg  200 mg Oral Daily Naseem Daphene Jaeger, MD   200 mg at 10/16/18 0908   ??? amLODIPine (NORVASC) tablet 10 mg  10 mg Oral Daily Naseem Daphene Jaeger, MD   10 mg at 10/15/18 0534   ??? aspirin chewable tablet 81 mg  81 mg Oral Daily Rica Koyanagi, MD   81 mg at 10/16/18 1191   ??? atorvastatin (LIPITOR) tablet 80 mg  80 mg Oral Daily Naseem Daphene Jaeger, MD   80 mg at 10/16/18 0911   ??? bumetanide (BUMEX) tablet 2 mg  2 mg Oral Daily Naseem Daphene Jaeger, MD   2 mg at 10/16/18 4782   ??? calcium carbonate (TUMS) chewable tablet 400 mg of elem calcium  400 mg of elem calcium Oral BID PRN Naseem Daphene Jaeger, MD       ??? carvediloL (COREG) tablet 25 mg  25 mg Oral BID Hurman Horn, MD   25 mg at 10/16/18 0908   ??? dextrose 50 % in water (D50W) 50 % solution 12.5 g  12.5 g Intravenous Q10 Min PRN Naseem Daphene Jaeger, MD       ??? FLUoxetine (PROzac) capsule 80 mg  80 mg Oral Daily Naseem Daphene Jaeger, MD  80 mg at 10/16/18 0910   ??? gabapentin (NEURONTIN) capsule 300 mg  300 mg Oral BID Hurman Horn, MD   300 mg at 10/16/18 0254   ??? guaiFENesin (ROBITUSSIN) oral syrup  200 mg Oral Q4H PRN Naseem Daphene Jaeger, MD       ??? heparin (porcine) 1000 unit/mL injection 2,000 Units  2,000 Units Intravenous Q6H PRN Rica Koyanagi, MD       ??? heparin 25,000 Units/250 mL (100 units/mL) in 0.45% saline infusion (premade)  12 Units/kg/hr Intravenous Continuous Rica Koyanagi, MD 18.44 mL/hr at 10/16/18 0549 12 Units/kg/hr at 10/16/18 0549   ??? insulin glargine (LANTUS) injection 30 Units  30 Units Subcutaneous Nightly Rica Koyanagi, MD       ??? insulin lispro (HumaLOG) injection 0-12 Units  0-12 Units Subcutaneous ACHS Rica Koyanagi, MD   2 Units at 10/15/18 2015   ??? isosorbide mononitrate (IMDUR) 24 hr tablet 120 mg  120 mg Oral Daily Rica Koyanagi, MD   120 mg at 10/16/18 0911   ??? melatonin tablet 6 mg  6 mg Oral Nightly PRN Hurman Horn, MD       ??? nitroglycerin (NITROSTAT) 2 % ointment 1 inch  1 inch Topical q6h-offMN Rimma Osipov, MD   1 inch at 10/16/18 0634   ??? nitroglycerin (NITROSTAT) SL tablet 0.4 mg  0.4 mg Sublingual Q5 Min PRN Hurman Horn, MD   0.4 mg at 10/16/18 2706   ??? ondansetron (ZOFRAN-ODT) disintegrating tablet 8 mg  8 mg Oral Q8H PRN Hurman Horn, MD        Or   ??? ondansetron (ZOFRAN) injection 4 mg  4 mg Intravenous Q8H PRN Naseem Daphene Jaeger, MD       ??? pantoprazole (PROTONIX) EC tablet 40 mg  40 mg Oral BID Naseem Daphene Jaeger, MD   40 mg at 10/16/18 0908   ??? polyethylene glycol (MIRALAX) packet 17 g  17 g Oral Daily PRN Naseem Daphene Jaeger, MD       ??? QUEtiapine (SEROquel) tablet 50 mg  50 mg Oral BID PRN Hurman Horn, MD       ??? senna (SENOKOT) tablet 2 tablet  2 tablet Oral Nightly PRN Hurman Horn, MD       ??? sevelamer (RENVELA) tablet 800 mg  800 mg Oral 3xd Meals Naseem Daphene Jaeger, MD   800 mg at 10/16/18 2376   ??? ticagrelor (BRILINTA) tablet 90 mg  90 mg Oral BID Hurman Horn, MD   90 mg at 10/16/18 0911         Objective:   Patient Vitals for the past 8 hrs:   BP Temp Pulse SpO2 Pulse Resp SpO2   10/16/18 0400 137/69 36.7 ??C 70 70 17 97 %     No intake/output data recorded.    Physical Exam:  Gen: Pleasant male lying in bed comfortably.  Neck: Large neck. Difficult to assess JVP  Resp: Clear to auscultation bilaterally with normal work of breathing.  CV: Regular rate and rhythm without murmurs, rubs, or gallops.  Abd: Soft, non-tender, and non-distended without hepatosplenomegaly.  Obese.   Ext: No lower extremity edema.    Test Results  Data Review:    All lab results last 24 hours:    Recent Results (from the past 24 hour(s))   POCT Glucose    Collection Time: 10/15/18 11:31 AM   Result Value Ref Range    Glucose,  POC 223 (H) 70 - 179 mg/dL   Troponin I Collection Time: 10/15/18  4:50 PM   Result Value Ref Range    Troponin I 1.180 (HH) <0.060 ng/mL   aPTT    Collection Time: 10/15/18  4:50 PM   Result Value Ref Range    APTT 30.8 25.3 - 37.1 sec    Heparin Correlation 0.2    POCT Glucose    Collection Time: 10/15/18  4:52 PM   Result Value Ref Range    Glucose, POC 102 70 - 179 mg/dL   POCT Glucose    Collection Time: 10/15/18  8:07 PM   Result Value Ref Range    Glucose, POC 169 70 - 179 mg/dL   ECG 12 Lead    Collection Time: 10/15/18 11:02 PM   Result Value Ref Range    EKG Systolic BP  mmHg    EKG Diastolic BP  mmHg    EKG Ventricular Rate 70 BPM    EKG Atrial Rate 70 BPM    EKG P-R Interval 258 ms    EKG QRS Duration 92 ms    EKG Q-T Interval 436 ms    EKG QTC Calculation 470 ms    EKG Calculated P Axis 58 degrees    EKG Calculated R Axis -11 degrees    EKG Calculated T Axis 140 degrees    QTC Fredericia 459 ms   Troponin I    Collection Time: 10/15/18 11:20 PM   Result Value Ref Range    Troponin I 1.170 (HH) <0.060 ng/mL   APTT    Collection Time: 10/16/18  6:00 AM   Result Value Ref Range    APTT 81.4 (H) 25.3 - 37.1 sec    Heparin Correlation 0.5    Basic Metabolic Panel    Collection Time: 10/16/18  6:00 AM   Result Value Ref Range    Sodium 139 135 - 145 mmol/L    Potassium 6.1 (HH) 3.5 - 5.0 mmol/L    Chloride 110 (H) 98 - 107 mmol/L    CO2 22.0 22.0 - 30.0 mmol/L    Anion Gap 7 7 - 15 mmol/L    BUN 39 (H) 7 - 21 mg/dL    Creatinine 5.78 (H) 0.70 - 1.30 mg/dL    BUN/Creatinine Ratio 10     EGFR CKD-EPI Non-African American, Male 18 (L) >=60 mL/min/1.40m2    EGFR CKD-EPI African American, Male 20 (L) >=60 mL/min/1.109m2    Glucose 255 (H) 70 - 179 mg/dL    Calcium 8.4 (L) 8.5 - 10.2 mg/dL   Troponin I    Collection Time: 10/16/18  6:00 AM   Result Value Ref Range    Troponin I 1.070 (HH) <0.060 ng/mL   ECG 12 Lead    Collection Time: 10/16/18  6:10 AM   Result Value Ref Range    EKG Systolic BP  mmHg    EKG Diastolic BP  mmHg    EKG Ventricular Rate 70 BPM    EKG Atrial Rate 70 BPM    EKG P-R Interval 204 ms    EKG QRS Duration 94 ms    EKG Q-T Interval 414 ms    EKG QTC Calculation 447 ms    EKG Calculated P Axis 54 degrees    EKG Calculated R Axis 0 degrees    EKG Calculated T Axis 136 degrees    QTC Fredericia 436 ms   POCT Glucose    Collection Time: 10/16/18  7:11  AM   Result Value Ref Range    Glucose, POC 190 (H) 70 - 179 mg/dL     ECG: A paced rhythm. TWI in lateral leads

## 2018-10-16 NOTE — Unmapped (Addendum)
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 *          The patient is a 48 year old Caucasian male with a history of extensive coronary artery disease status post multiple stenting, hypertension, stage IV chronic kidney disease and a poor heart cath candidate who was admitted to the hospital for recurrence of chest pain and elevation in his troponin.  He is atrially paced.  Troponin initially without significant elevation at 0.08, began 0.24, now down to 1.07.  Continue to monitor. Cardiology recommendation is to discontinue his Xarelto, continue heparin drip  over the weekend, follow serial troponins and get stress test on Monday.    1.  CAD, manage as above.    2.  Type 2 diabetes mellitus.  Patient insists on a regular diet, he does not take his insulin as exactly prescribed, taking only Lantus in the evening and not in the morning but he does take before meal insulin.  We will increase Lantus to 40 units at bedtime since he has been noncompliant with his diet, ordering [place] fries, [employer].  He is adamant about not being on a consistent carbohydrate diet.  Remarkably, his hemoglobin A1c is only moderately elevated at 7.9.    3.  Morbid obesity, he has been witnessed sleeping soundly and snoring.  He does not have a diagnosis of sleep apnea but this is very likely.  Will need outpatient follow-up.    4.  Chronic kidney disease, stage IV, any procedure with contrast would be risky, patient understands that.  He has developed some hyperkalemia to 6.2.  Give Kayexalate.  Recheck basic metabolic panel in the morning.  Continue Bumex.    5.  Paroxysmal A. fib.  He is chronically on Xarelto, he will be on heparin drip over the weekend.    7.  History of heart failure, he appears euvolemic at this time.  He is in the CCU with stepdown status, on Bumex as noted.  He cannot tolerate ACE or ARB due to his kidney disease.  He does not appear to be on hydralazine, this remains as an option if needed.    *DVT prophylaxis: He is fully anticoagulated.    *Disposition: To be determined after stress test on Monday.      Please page the [facility]) pager at [phone] with questions.          Subjective:   He had a transient episode of left-sided neck pain he recalls as an anginal equivalent.  No further pain, it resolved with Nitropaste.  Appetite very good.  Objective:       Vital signs in last 24 hours:  Temp:  [36.5 ??C (97.7 ??F)-36.7 ??C (98.1 ??F)] 36.7 ??C (98.1 ??F)  Heart Rate:  [70-72] 72  SpO2 Pulse:  [68-80] 72  Resp:  [12-20] 20  BP: (119-144)/(62-77) 134/69  MAP (mmHg):  [85-102] 90  SpO2:  [96 %-100 %] 100 %    Intake/Output last 24 hours:    Intake/Output Summary (Last 24 hours) at 10/16/2018 1527  Last data filed at 10/16/2018 0800  Gross per 24 hour   Intake 360 ml   Output 1050 ml   Net -690 ml         Physical Exam:    Gen: Alert, NAD, pleasant, conversant, morbidly obese.    HEENT: No icterus.    CV: Regular rate and rhythm, distant heart tones.    PULM/chest: Clear to auscultation anteriorly..    VWU:JWJX, massive pannus, non tender, No palpable organomegaly Ext: No C/C, trace pitting pedal edema.    Neuro: No acute focal deficits.    Skin: Warm, dry.      Medications:   Scheduled Meds:  ??? amiodarone  200 mg Oral Daily   ??? amLODIPine  10 mg Oral Daily   ??? aspirin  81 mg Oral Daily   ??? atorvastatin  80 mg Oral Daily   ??? bumetanide  2 mg Oral Daily   ??? carvediloL  25 mg Oral BID   ??? FLUoxetine  80 mg Oral Daily   ??? gabapentin  300 mg Oral BID   ??? insulin glargine  40 Units Subcutaneous Nightly   ??? insulin lispro  0-12 Units Subcutaneous ACHS   ??? isosorbide mononitrate  120 mg Oral Daily   ??? nitroglycerin  1 inch Topical q6h-offMN   ??? pantoprazole  40 mg Oral BID   ??? sevelamer  800 mg Oral 3xd Meals   ??? ticagrelor  90 mg Oral BID     All labs from admission and going forward are reviewed.      35 minutes, over 50% spent in counseling and coordination of care.  Tawni Levy MD

## 2018-10-16 NOTE — Unmapped (Signed)
Patient seen by cardiology, continue to monitor.    Problem: Adult Inpatient Plan of Care  Goal: Plan of Care Review  Outcome: Ongoing - Unchanged  Goal: Patient-Specific Goal (Individualization)  Outcome: Ongoing - Unchanged  Goal: Absence of Hospital-Acquired Illness or Injury  Outcome: Ongoing - Unchanged  Goal: Optimal Comfort and Wellbeing  Outcome: Ongoing - Unchanged  Goal: Readiness for Transition of Care  Outcome: Ongoing - Unchanged  Goal: Rounds/Family Conference  Outcome: Ongoing - Unchanged

## 2018-10-16 NOTE — Unmapped (Signed)
Pt had episode of 8/10 chest pain while ekg in progress. MD paged, nitro given sl, no change in ekg. Pt now pain free.

## 2018-10-16 NOTE — Unmapped (Signed)
For additional assistance finding food or other resources in your community contact [place] [address].    By phone: DIAL 2-1-1 Or [phone]    Or Check them out online at [email or web address]

## 2018-10-16 NOTE — Unmapped (Signed)
Care Management  Initial Transition Planning Assessment    Patient has been identified as a Runner, broadcasting/film/video.  Requested that a  timely follow-up appointment be scheduled and documented prior to discharge.  Longitudinal Plan of Care reviewed, the following information is noted for continuity of care:     48 y.o. male history of asthma, A. fib/flutter (on Xarelto)  , Ischemic cardiomyopathy with reduced EF (EF 40 to 45% 05/2018) with prior STEMI 2018 associated with V. tach arrest, prior 6 PCI, stage IVCKD, chronic pain syndrome, nephropathy  Who presents to care with 4 hours of chest discomfort radiating up to his neck with mild improvement with nitroglycerin.        Type of Residence: Mailing Address:  [address]  [place] [address]  Contacts:    Patient Phone Number: spouse [name] [phone]        Medical Provider(s): Kurtis Bushman, MD  Reason for Admission: Admitting Diagnosis:  Elevated troponin [R79.89]  Neck pain on left side [M54.2]  Chest pain, unspecified type [R07.9]  Past Medical History:   has a past medical history of Angina at rest (CMS-[name]), Arthritis, Asthma, Atrial fibrillation and flutter (CMS-[name]) (06/08/14), BMI 40.0-44.9, adult (CMS-[name]) (10/29/2015), Chronic anticoagulation (02/17/2018), Chronic kidney disease, stage 3 (CMS-[name]), Chronic pain syndrome (04/11/2014), Coronary artery disease, Depression, Diabetes mellitus (CMS-[name]), Eye trauma (1998), GERD (gastroesophageal reflux disease), Gout, Homeless, Hyperlipidemia, Hypertension, Illiteracy and low-level literacy, Microscopic hematuria, Migraine, Nephrolithiasis, 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  Past Surgical History:   has a past surgical history that includes Knee surgery (Right, 09/23/2006); Cardiac catheterization (03/08/2007); Cystourethroscopy (12/31/2009 ); pr inser hart pacer xvenous atrial (N/A, 05/30/2015); pr ephys eval w/ ablation supravent arrhythmia (N/A, 07/19/2015); pr upper gi endoscopy,biopsy (N/A, 02/28/2016); pr upper gi endoscopy,diagnosis (N/A, 05/07/2016); pr cath place/coron angio, img super/interp,r&l hrt cath, l hrt ventric (N/A, 02/13/2017); pr cath place/coron angio, img super/interp,w left heart ventriculography (N/A, 03/17/2017); pr insert/place flow direct cath (N/A, 03/17/2017); pr ecmo/ecls initiation veno-arterial (N/A, 03/19/2017); pr rebl ves direct,low extrem (Left, 03/19/2017); pr ecmo/ecls rmvl prph cannula open 6 yrs & older (Left, 03/25/2017); pr remv art clot iliac-pop,leg incis (Left, 03/25/2017); pr negative pressure wound therapy dme >50 sq cm (Left, 03/25/2017); pr insj/rplcmt perm dfb w/trnsvns lds 1/dual chmbr (N/A, 05/04/2017); pr prq trluml coronary stent w/angio one art/brnch (N/A, 03/12/2018); and pr cath place/coron angio, img super/interp,w left heart ventriculography (N/A, 05/12/2018).   Previous admit date: 09/28/2018    Primary Insurance- Payor: MEDICARE / Plan: MEDICARE PART A AND PART B / Product Type: *No Product type* /   Secondary Insurance ??? Secondary Insurance  MEDICAID [place]  Prescription Coverage ??? Medicaid  Preferred Pharmacy - [facility] - [place], [place] - [address]  [place] [facility]  [facility] - [place] - [address]  [facility] [place] - [address]  [facility] [place] - [address]  [facility]  [facility]  [facility] - [place] ([place]), [place] - [address]    Transportation home: Private vehicle  Level of function prior to admission: Independent   Spouse or friends will provide  transportation home at discharge.  Spouse states that are living with another adult couple and their 5 children, would not give CM address. States she just had her knee surgery this morning and that they hope to be back in their own home soon. Would appreciated food basket/bags at discharge. Referral submitted.                    General  Care Manager assessed the patient by : Telephone conversation with family, Medical record review, Discussion with Clinical Care team  Orientation Level: Oriented X4  Who provides care at home?: Family member  Reason for referral: Discharge Planning    Contact/Decision [facility]  Extended Emergency Contact Information  Primary Emergency Contact: Meckel,[name]  Address: [address] [place], [place] [address] [place]  Home Phone: [phone]  Relation: Spouse    Legal Next of Kin / Guardian / POA / Advance Directives     [name] (HCPOA): Camargo,[name] - [phone]    Advance Directive (Medical Treatment)  Does patient have an advance directive covering medical treatment?: Patient does not have advance directive covering medical treatment.  Reason patient does not have an advance directive covering medical treatment:: Patient does not wish to complete one at this time.    Health Care Decision Maker [[name]] (Medical & Mental Health Treatment)  Healthcare Decision Maker: [name] documented in the [name]/Contact Info section.  Information offered on [name], Medical & Mental Health advance directives:: Patient declined information.    Advance Directive (Mental Health Treatment)  Does patient have an advance directive covering mental health treatment?: Patient does not have advance directive covering mental health treatment.  Reason patient does not have an advance directive covering mental health treatment:: Patient does not wish to complete one at this time.    Patient Information  Lives with: Spouse/significant other, Other (Comment)(currently living with friends in [place], would not provide address)    Type of Residence: Private residence        Location/Detail: [place], has PO box now    Support Systems: Spouse, Friends/Neighbors    Responsibilities/Dependents at home?: No    Home Care services in place prior to admission?: No                  Equipment Currently Used at Home: walker, rolling, wheelchair, manual, cane, straight, shower chair       Currently receiving outpatient dialysis?: No       Financial Information       Need for financial assistance?: Yes  Type of financial assistance required: Other (Comment)(positive for food insecurity)    Social Determinants of Health  Social Determinants of Health were addressed in provider documentation.  Please refer to patient history.    Discharge Needs Assessment  Concerns to be Addressed: denies needs/concerns at this time    Clinical Risk Factors: Poor Health Literacy, Functional Limitations, History of Falls, Multiple Diagnoses (Chronic)    Barriers to taking medications: No    Prior overnight hospital stay or ED visit in last 90 days: Yes    Readmission Within the Last 30 Days: unable to assess         Anticipated Changes Related to Illness: none    Equipment Needed After Discharge: none    Discharge Facility/Level of Care Needs: (home self care)    Readmission  Risk of Unplanned Readmission Score: UNPLANNED READMISSION SCORE: 62%  Predictive  Model Details           62% (High) Factors Contributing to Score   Calculated 10/15/2018 19:09 30% Number of ED visits in last six months is 11   [place] Risk of Unplanned Readmission Model 20% Number of active Rx orders is 61     14% Number of hospitalizations in last year is 6     5% Active antipsychotic Rx order is present     5% ECG/EKG order is present in last 6 months     4% Charlson Comorbidity Index is 7     4% Latest BUN is high (41 mg/dL)     4% Diagnosis of electrolyte disorder is present     Readmitted Within the Last 30 Days? (No if blank) Yes  Patient at risk for readmission?: Yes    Discharge Plan  Screen findings are: Care Manager reviewed the plan of the patient's care with the Multidisciplinary Team. No discharge planning needs identified at this time. Care Manager will continue to manage plan and monitor patient's progress with the team.    Expected Discharge Date: TBD    Expected Transfer from Critical Care: (TBD)    Patient and/or family were provided with choice of facilities / services that are available and appropriate to meet post hospital care needs?: N/A       Initial Assessment complete?: Yes

## 2018-10-17 LAB — BASIC METABOLIC PANEL
ANION GAP: 12 mmol/L (ref 7–15)
BLOOD UREA NITROGEN: 37 mg/dL — ABNORMAL HIGH (ref 7–21)
BUN / CREAT RATIO: 9
CALCIUM: 8.4 mg/dL — ABNORMAL LOW (ref 8.5–10.2)
CHLORIDE: 102 mmol/L (ref 98–107)
CO2: 22 mmol/L (ref 22.0–30.0)
CREATININE: 3.95 mg/dL — ABNORMAL HIGH (ref 0.70–1.30)
EGFR CKD-EPI AA MALE: 19 mL/min/{1.73_m2} — ABNORMAL LOW (ref >=60)
EGFR CKD-EPI NON-AA MALE: 17 mL/min/{1.73_m2} — ABNORMAL LOW (ref >=60)
GLUCOSE RANDOM: 173 mg/dL (ref 70–179)

## 2018-10-17 LAB — APTT
APTT: 45.4 s — ABNORMAL HIGH (ref 25.3–37.1)
Coagulation surface induced:Time:Pt:PPP:Qn:Coag: 45.4 — ABNORMAL HIGH

## 2018-10-17 LAB — TROPONIN I
Troponin I.cardiac:MCnc:Pt:Ser/Plas:Qn:: 0.51
Troponin I.cardiac:MCnc:Pt:Ser/Plas:Qn:: 0.54
Troponin I.cardiac:MCnc:Pt:Ser/Plas:Qn:: 0.57

## 2018-10-17 LAB — HEPARIN CORRELATION: Lab: 0.3

## 2018-10-17 LAB — GLUCOSE RANDOM: Glucose:MCnc:Pt:Ser/Plas:Qn:: 173

## 2018-10-17 NOTE — Unmapped (Signed)
Pt had shaking chills for about an hours with no fever.  VSS  stable. SR on tele. MD  notified and in the room. Pt c/o generalized pain that is managed with tylenol. Bear hugger applied for couple of hours for chills. Pt also c/o neck pain that is managed with nitro paste ( see MAR). Cont on heparin drip. Schedule for stress test on Monday. Cont to monitor, see flowsheet for full assessment.     Problem: Adult Inpatient Plan of Care  Goal: Plan of Care Review  Outcome: Progressing  Goal: Patient-Specific Goal (Individualization)  Outcome: Progressing  Goal: Absence of Hospital-Acquired Illness or Injury  Outcome: Progressing  Goal: Optimal Comfort and Wellbeing  Outcome: Progressing  Goal: Readiness for Transition of Care  Outcome: Progressing  Goal: Rounds/Family Conference  Outcome: Progressing     Problem: Fall Injury Risk  Goal: Absence of Fall and Fall-Related Injury  Outcome: Progressing     Problem: Diabetes Comorbidity  Goal: Blood Glucose Level Within Desired Range  Outcome: Progressing     Problem: Heart Failure Comorbidity  Goal: Maintenance of Heart Failure Symptom Control  Outcome: Progressing

## 2018-10-17 NOTE — Unmapped (Signed)
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 *          The patient is a 48 year old Caucasian male with a history of extensive coronary artery disease status post multiple stenting, hypertension, stage IV chronic kidney disease and a poor heart cath candidate who was admitted to the hospital for recurrence of chest pain and elevation in his troponin that has peaked and is now trending downward but still elevated at 0.5..  He is atrially paced.      1.  CAD, manage as above.  Heparin drip, PET/CT stress in [facility] tomorrow.    2.  Type 2 diabetes mellitus.  Patient insists on a regular diet, he does not take his insulin as exactly prescribed, he is counseled on avoiding concentrated sweets, and agrees to this, sugars under reasonable control, continue with Lantus 40 in the evening of sliding scale.    3.  Morbid obesity, he has been witnessed sleeping soundly and snoring.  He does not have a diagnosis of sleep apnea but this is very likely.  Will need outpatient follow-up.    4.  Chronic kidney disease, stage IV, any procedure with contrast would be risky, patient understands that.  Hyperkalemia to 6.2, 30 g Kayexalate given, K+ down to 5.4, repeat x1, check in a.m.    5.  Paroxysmal A. fib.  He is chronically on Xarelto, he will be on heparin drip over the weekend and the Xarelto held per Cardiology recommendation.  No issues with tach arrhythmias during this hospitalization    7.  History of heart failure, he appears euvolemic at this time.  He is in the CCU with stepdown status, on Bumex as noted.  He cannot tolerate ACE or ARB due to his kidney disease.  He does not appear to be on hydralazine, this remains as an option if needed.    *DVT prophylaxis: He is fully anticoagulated.  Resume Xarelto when clinically indicated.    *Disposition: To be determined after stress test on Monday.      Please page the [facility]) pager at [phone] with questions.          Subjective:   No complaint of chest pain overnight.  He has periods of anxiety.    Objective:       Vital signs in last 24 hours:  Temp:  [36.7 ??C (98.1 ??F)-37.8 ??C (100.1 ??F)] 36.8 ??C (98.2 ??F)  Heart Rate:  [74-86] 80  SpO2 Pulse:  [74-85] 75  Resp:  [15-20] 19  BP: (110-158)/(38-89) 142/63  MAP (mmHg):  [67-111] 81  SpO2:  [95 %-100 %] 97 %    Intake/Output last 24 hours:    Intake/Output Summary (Last 24 hours) at 10/17/2018 1309  Last data filed at 10/17/2018 0800  Gross per 24 hour   Intake 720 ml   Output 2600 ml   Net -1880 ml         Physical Exam:    Gen: Alert, NAD, pleasant, conversant, morbidly obese.    HEENT: No icterus.    CV: Regular rate and rhythm, distant heart tones.    PULM/chest: Clear to auscultation anteriorly.  Patient is sitting up, few scant late inspiratory bibasilar crackles.    ZOX:WRUE, massive pannus, non tender, No palpable organomegaly    Ext: No C/C, trace pitting pedal edema.    Neuro: No acute focal deficits.    Skin: Warm, dry.      Medications:   Scheduled Meds:  ??? amiodarone  200 mg Oral Daily   ??? amLODIPine  10 mg Oral Daily   ??? aspirin  81 mg Oral Daily   ??? atorvastatin 80 mg Oral Daily   ??? bumetanide  2 mg Oral Daily   ??? carvediloL  25 mg Oral BID   ??? FLUoxetine  80 mg Oral Daily   ??? gabapentin  300 mg Oral BID   ??? insulin glargine  40 Units Subcutaneous Nightly   ??? insulin lispro  0-12 Units Subcutaneous ACHS   ??? isosorbide mononitrate  120 mg Oral Daily   ??? pantoprazole  40 mg Oral BID   ??? sevelamer  800 mg Oral 3xd Meals   ??? ticagrelor  90 mg Oral BID     All labs from admission and going forward are reviewed.      35 minutes, over 50% spent in counseling and coordination of care.  Tawni Levy MD

## 2018-10-17 NOTE — Unmapped (Signed)
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)    Obesity, Class III, BMI 40-49.9 (morbid obesity) (CMS-[name])    GERD (gastroesophageal reflux disease)    Mild intermittent asthma without complication    Paroxysmal atrial fibrillation (CMS-[name])    Tobacco use disorder    Chronic heart failure with reduced ejection fraction and diastolic dysfunction (CMS-[name])    Hyperkalemia    CKD (chronic kidney disease) stage 4, GFR 15-29 ml/min (CMS-[name])    Type 2 diabetes mellitus with stage 4 chronic kidney disease, with long-term current use of insulin (CMS-[name])     NSTEMI  The patient has significant CAD with multiple prior cardiac interventions including recent balloon angioplasty of LAD. He is admitted with chest pain and diagnosed with NSTEMI. He is awaiting PET stress test on Monday 7/20. He remains clinically stable on my evaluation.  -Continue current medical regimen including heparin gtt  -NPO at [facility] tonight. No caffeine after this afternoon      Interval Events and Subjective:   -No complaints this morning other than feeling cold    Medications:   Current Facility-Administered Medications   Medication Dose Route Frequency Provider Last Rate Last Dose   ??? acetaminophen (TYLENOL) tablet 650 mg  650 mg Oral Q4H PRN Hurman Horn, MD   650 mg at 10/17/18 0301   ??? albuterol 2.5 mg /3 mL (0.083 %) nebulizer solution 2.5 mg  2.5 mg Nebulization Q6H PRN Naseem Daphene Jaeger, MD       ??? aluminum-magnesium hydroxide-simethicone (MAALOX MAX) 80-80-8 mg/mL oral suspension  30 mL Oral Q4H PRN Naseem Daphene Jaeger, MD       ??? amiodarone (PACERONE) tablet 200 mg  200 mg Oral Daily Naseem Daphene Jaeger, MD   200 mg at 10/17/18 0845   ??? amLODIPine (NORVASC) tablet 10 mg  10 mg Oral Daily Naseem Daphene Jaeger, MD   10 mg at 10/17/18 0845   ??? aspirin chewable tablet 81 mg  81 mg Oral Daily Rica Koyanagi, MD   81 mg at 10/17/18 0845   ??? atorvastatin (LIPITOR) tablet 80 mg  80 mg Oral Daily Naseem Daphene Jaeger, MD   80 mg at 10/17/18 0845   ??? bumetanide (BUMEX) tablet 2 mg  2 mg Oral Daily Naseem Daphene Jaeger, MD   2 mg at 10/17/18 0846   ??? calcium carbonate (TUMS) chewable tablet 400 mg of elem calcium  400 mg of elem calcium Oral BID PRN Naseem Daphene Jaeger, MD       ??? carvediloL (COREG) tablet 25 mg  25 mg Oral BID Hurman Horn, MD   25 mg at 10/17/18 0845   ??? dextrose 50 % in water (D50W) 50 % solution 12.5 g  12.5 g Intravenous Q10 Min PRN Naseem Daphene Jaeger, MD       ??? FLUoxetine (PROzac) capsule 80 mg  80 mg Oral Daily Naseem Daphene Jaeger, MD   80 mg at 10/17/18 0846   ??? gabapentin (NEURONTIN) capsule 300 mg  300 mg Oral BID Hurman Horn, MD   300 mg at 10/17/18 0845   ??? guaiFENesin (  ROBITUSSIN) oral syrup  200 mg Oral Q4H PRN Naseem Daphene Jaeger, MD       ??? heparin (porcine) 1000 unit/mL injection 2,000 Units  2,000 Units Intravenous Q6H PRN Rica Koyanagi, MD       ??? heparin 25,000 Units/250 mL (100 units/mL) in 0.45% saline infusion (premade)  12 Units/kg/hr Intravenous Continuous Rica Koyanagi, MD 18.44 mL/hr at 10/16/18 2053 12 Units/kg/hr at 10/16/18 2053   ??? insulin glargine (LANTUS) injection 40 Units  40 Units Subcutaneous Nightly Rica Koyanagi, MD   40 Units at 10/16/18 2055   ??? insulin lispro (HumaLOG) injection 0-12 Units  0-12 Units Subcutaneous ACHS Rica Koyanagi, MD   2 Units at 10/17/18 [phone]   ??? isosorbide mononitrate (IMDUR) 24 hr tablet 120 mg  120 mg Oral Daily Rica Koyanagi, MD   120 mg at 10/17/18 0846   ??? melatonin tablet 6 mg  6 mg Oral Nightly PRN Hurman Horn, MD   6 mg at 10/16/18 2049   ??? nitroglycerin (NITROSTAT) 2 % ointment 1 inch  1 inch Topical Q6H PRN Zannie Cove, MD   Stopped at 10/17/18 0051   ??? nitroglycerin (NITROSTAT) SL tablet 0.4 mg  0.4 mg Sublingual Q5 Min PRN Hurman Horn, MD   0.4 mg at 10/16/18 9604   ??? ondansetron (ZOFRAN-ODT) disintegrating tablet 8 mg  8 mg Oral Q8H PRN Hurman Horn, MD        Or   ??? ondansetron (ZOFRAN) injection 4 mg  4 mg Intravenous Q8H PRN Naseem Daphene Jaeger, MD       ??? pantoprazole (PROTONIX) EC tablet 40 mg  40 mg Oral BID Naseem Daphene Jaeger, MD   40 mg at 10/17/18 0845   ??? polyethylene glycol (MIRALAX) packet 17 g  17 g Oral Daily PRN Naseem Daphene Jaeger, MD       ??? QUEtiapine (SEROquel) tablet 50 mg  50 mg Oral BID PRN Hurman Horn, MD       ??? senna (SENOKOT) tablet 2 tablet  2 tablet Oral Nightly PRN Hurman Horn, MD       ??? sevelamer (RENVELA) tablet 800 mg  800 mg Oral 3xd Meals Naseem Daphene Jaeger, MD   800 mg at 10/17/18 0845   ??? ticagrelor (BRILINTA) tablet 90 mg  90 mg Oral BID Hurman Horn, MD   90 mg at 10/17/18 0846         Objective:   Patient Vitals for the past 8 hrs:   BP Temp Temp src Pulse SpO2 Pulse Resp SpO2   10/17/18 0400 139/56 37.8 ??C Axillary 85 85 15 95 %     No intake/output data recorded.    Physical Exam:  Gen: Pleasant male lying in bed comfortably.  Neck: Large neck. Difficult to assess JVP  Resp: Clear to auscultation bilaterally with normal work of breathing.  CV: Regular rate and rhythm without murmurs, rubs, or gallops.  Abd: Soft, non-tender, and non-distended without hepatosplenomegaly.  Obese.   Ext: No lower extremity edema.    Test Results  Data Review:    All lab results last 24 hours:    Recent Results (from the past 24 hour(s))   POCT Glucose    Collection Time: 10/16/18 11:27 AM   Result Value Ref Range    Glucose, POC 323 (H) 70 - 179 mg/dL   POCT Glucose    Collection Time: 10/16/18  4:21 PM   Result Value  Ref Range    Glucose, POC 324 (H) 70 - 179 mg/dL   Troponin I Collection Time: 10/16/18  5:19 PM   Result Value Ref Range    Troponin I 0.470 (HH) <0.060 ng/mL   POCT Glucose    Collection Time: 10/16/18  8:22 PM   Result Value Ref Range    Glucose, POC 259 (H) 70 - 179 mg/dL   Troponin I    Collection Time: 10/16/18  9:08 PM   Result Value Ref Range    Troponin I 0.560 (HH) <0.060 ng/mL   Troponin I    Collection Time: 10/17/18 12:22 AM   Result Value Ref Range    Troponin I 0.540 (HH) <0.060 ng/mL   Basic Metabolic Panel    Collection Time: 10/17/18  5:26 AM   Result Value Ref Range    Sodium 136 135 - 145 mmol/L    Potassium 5.4 (H) 3.5 - 5.0 mmol/L    Chloride 102 98 - 107 mmol/L    CO2 22.0 22.0 - 30.0 mmol/L    Anion Gap 12 7 - 15 mmol/L    BUN 37 (H) 7 - 21 mg/dL    Creatinine 5.62 (H) 0.70 - 1.30 mg/dL    BUN/Creatinine Ratio 9     EGFR CKD-EPI Non-African American, Male 17 (L) >=60 mL/min/1.59m2    EGFR CKD-EPI African American, Male 19 (L) >=60 mL/min/1.69m2    Glucose 173 70 - 179 mg/dL    Calcium 8.4 (L) 8.5 - 10.2 mg/dL   APTT    Collection Time: 10/17/18  5:26 AM   Result Value Ref Range    APTT 45.4 (H) 25.3 - 37.1 sec    Heparin Correlation 0.3    APTT    Collection Time: 10/17/18  5:26 AM   Result Value Ref Range    APTT 47.3 (H) 25.3 - 37.1 sec    Heparin Correlation 0.3    Troponin I    Collection Time: 10/17/18  5:26 AM   Result Value Ref Range    Troponin I 0.510 (HH) <0.060 ng/mL   POCT Glucose    Collection Time: 10/17/18  7:13 AM   Result Value Ref Range    Glucose, POC 153 70 - 179 mg/dL

## 2018-10-17 NOTE — Unmapped (Addendum)
[ ]   LHC with Andrey Farmer 295AO 4/4    48 year old Caucasian male with complex coronary artery disease status post multiple stenting, non-STEMI earlier this month he was admitted once again with chest pain and non-STEMI troponin elevated to 1.18, and subsequently downtrending.  He is also chronically anticoagulated for paroxysmal atrial fibrillation and is atrially paced.  He does have a history of heart failure with reduced ejection fraction and likely some diastolic dysfunction but this was not an clinical exacerbation at the time of admission.    The patient was admitted to the hospital, seen by Cardiology who IV heparin, discontinue Xarelto and manage medically.  With his creatinine of just under 5, he is not a good heart cath candidate.  He was managed on a heparin drip for 3 days and then subsequently transferred to the [facility] campus for PET CT stress test to determine the best approach to further diagnosis and treatment in terms of his repeated problems with angina.  His medical management has been maximized and while in-house, and even so, he still needed some Nitropaste on top of his long-acting oral nitrates to control bouts of breakthrough chest pain.    Despite his dietary indiscretion, his blood sugars are reasonably well controlled.  We had a rather frank discussion about trying to adhere to a consistent carbohydrate diet and perhaps more importantly, lose weight.    Despite 2 mg of Bumex by mouth daily, he did develop some mild hyperkalemia to 6.2 that was treated with Kayexalate.  He is not on an ACE/ARB or Aldactone or other potassium retaining medication.  The potassium retention appears to be due primarily to chronic kidney disease.

## 2018-10-17 NOTE — Unmapped (Signed)
See notes in chart    Problem: Adult Inpatient Plan of Care  Goal: Plan of Care Review  Outcome: Ongoing - Unchanged  Goal: Patient-Specific Goal (Individualization)  Outcome: Ongoing - Unchanged  Goal: Absence of Hospital-Acquired Illness or Injury  Outcome: Ongoing - Unchanged  Goal: Optimal Comfort and Wellbeing  Outcome: Ongoing - Unchanged  Goal: Readiness for Transition of Care  Outcome: Ongoing - Unchanged  Goal: Rounds/Family Conference  Outcome: Ongoing - Unchanged

## 2018-10-18 DIAGNOSIS — R079 Chest pain, unspecified: Principal | ICD-10-CM

## 2018-10-18 LAB — APTT
APTT: 38.1 s — ABNORMAL HIGH (ref 25.3–37.1)
Coagulation surface induced:Time:Pt:PPP:Qn:Coag: 27.5
Coagulation surface induced:Time:Pt:PPP:Qn:Coag: 38.1 — ABNORMAL HIGH
HEPARIN CORRELATION: 0.2
HEPARIN CORRELATION: 0.2

## 2018-10-18 LAB — BASIC METABOLIC PANEL
ANION GAP: 9 mmol/L (ref 7–15)
BLOOD UREA NITROGEN: 39 mg/dL — ABNORMAL HIGH (ref 7–21)
BUN / CREAT RATIO: 10
CALCIUM: 8.8 mg/dL (ref 8.5–10.2)
CHLORIDE: 108 mmol/L — ABNORMAL HIGH (ref 98–107)
CO2: 22 mmol/L (ref 22.0–30.0)
CREATININE: 3.89 mg/dL — ABNORMAL HIGH (ref 0.70–1.30)
EGFR CKD-EPI AA MALE: 20 mL/min/{1.73_m2} — ABNORMAL LOW (ref >=60)
EGFR CKD-EPI NON-AA MALE: 17 mL/min/{1.73_m2} — ABNORMAL LOW (ref >=60)
POTASSIUM: 4.8 mmol/L (ref 3.5–5.0)

## 2018-10-18 LAB — BLOOD UREA NITROGEN: Urea nitrogen:MCnc:Pt:Ser/Plas:Qn:: 39 — ABNORMAL HIGH

## 2018-10-18 LAB — TROPONIN I: Troponin I.cardiac:MCnc:Pt:Ser/Plas:Qn:: 0.58

## 2018-10-18 LAB — HEPARIN CORRELATION: Lab: 0.2

## 2018-10-18 NOTE — Unmapped (Signed)
CCM intake    Jeremy Oliver calls to report pt had MI this weekend.  Currently in HBO  Probable stress test today.  Wife, Jeremy Oliver-Spoke to pt 5 minutes ago and states he sounds anxious and stressed.  Jeremy Oliver had knee surgery Friday 7/17 and is recovering from that this week.      Pt's next appt scheduled w/ Diane D. SW this week.    Will notify Providers.

## 2018-10-18 NOTE — Unmapped (Signed)
Patient going to main campus for stress test See notes and chart    Problem: Adult Inpatient Plan of Care  Goal: Plan of Care Review  Outcome: Ongoing - Unchanged  Goal: Patient-Specific Goal (Individualization)  Outcome: Ongoing - Unchanged  Goal: Absence of Hospital-Acquired Illness or Injury  Outcome: Ongoing - Unchanged  Goal: Optimal Comfort and Wellbeing  Outcome: Ongoing - Unchanged  Goal: Readiness for Transition of Care  Outcome: Ongoing - Unchanged  Goal: Rounds/Family Conference  Outcome: Ongoing - Unchanged

## 2018-10-18 NOTE — Unmapped (Signed)
I  INTERNAL MEDICINE COUNSELING SESSION NOTE    LENGTH OF SESSION:  Psychotherapy Session 30 minutes    TYPE OF PSYCHOTHERAPY: Behavioral, Supportive    REASON FOR TREATMENT:  Supportive counseling        REVIEW OF FUNCTIONING SINCE LAST VISIT    MOOD (0-10) =    'ok today'  Rating deferred    SATISFACTION WITH EFFORT IN CARING FOR MOOD (0-10) =    Rating deferred    PAIN:     0 right now    PHQ-9 SCORE:     Rating deferred    GAD-7 SCORE:     Rating deferred      CURRENT SUICIDAL/HOMICIDAL IDEATION:  Denies SI / HI      PROBLEM LIST:    Patient Active Problem List   Diagnosis   ??? CAD (coronary artery disease) (RAF-[name])   ??? Idiopathic chronic gout of left knee   ??? Hypercholesteremia   ??? Essential hypertension (RAF-[name])   ??? 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 Osteoarthritis   ??? Chronic nonalcoholic liver disease   ??? GERD (gastroesophageal reflux disease)   ??? Mild intermittent asthma without complication   ??? Chronic pain syndrome   ??? Paroxysmal atrial fibrillation (CMS-[name])   ??? OSA (obstructive sleep apnea)   ??? Tobacco use disorder   ??? Status post cardiac pacemaker procedure   ??? Diabetes mellitus with gastroparesis (CMS-[name])   ??? Cardiac pacemaker in situ   ??? Recurrent major depressive disorder, in full remission (CMS-[name])   ??? NSTEMI (non-ST elevated myocardial infarction) (CMS-[name])   ??? Adjustment disorder, unspecified   ??? Chronic heart failure with reduced ejection fraction and diastolic dysfunction (CMS-[name])   ??? Hyperkalemia   ??? CKD (chronic kidney disease) stage 4, GFR 15-29 ml/min (CMS-[name])   ??? Acquired left foot drop   ??? Decreased sensation of lower extremity, left   ??? Chronic anticoagulation   ??? Type 2 diabetes mellitus with stage 4 chronic kidney disease, with long-term current use of insulin (CMS-[name])   ??? Situational stress         MEDICATION COMPLIANCE:     Taking all medications regularly?  yes     Any changes in medication since last visit?  yes - in hospital since Saturday for MI and also gout    Any missed doses? no       Current Outpatient Medications on File Prior to Visit   Medication Sig Dispense Refill   ??? albuterol HFA 90 mcg/actuation inhaler Inhale 2 puffs into the lungs every 6 (six) hours as needed for wheezing or shortness of breath. 18 g 2   ??? alirocumab (PRALUENT) 150 mg/mL subcutaneous injection Inject 1 mL (150 mg total) under the skin every fourteen (14) days. 4 Syringe 11   ??? allopurinoL (ZYLOPRIM) 100 MG tablet Take 0.5 tablets (50 mg total) by mouth daily. 45 tablet 3   ??? amiodarone (PACERONE) 200 MG tablet TAKE 1 TABLET BY MOUTH ONCE DAILY 90 tablet 3   ??? arm brace Misc 1 each by Miscellaneous route daily. 1 each 0   ??? atorvastatin (LIPITOR) 80 MG tablet Take 1 tablet (80 mg total) by mouth daily. 90 tablet 3   ??? bumetanide (BUMEX) 2 MG tablet Take 1 tablet (2 mg total) by mouth daily. 90 tablet 3   ??? carvediloL (COREG) 25 MG tablet Take 1.5 tablets (37.5 mg total) by  mouth Two (2) times a day. 270 tablet 3   ??? cholecalciferol, vitamin D3, 5,000 unit capsule Take 1 capsule (5,000 Units total) by mouth daily. 100 capsule 0   ??? diclofenac sodium (VOLTAREN) 1 % gel Apply 2 grams topically Three (3) times a day. 100 g 11   ??? empty container Misc Use as directed to dispose of injectable medications 1 each 3   ??? ezetimibe (ZETIA) 10 mg tablet TAKE 1 TABLET BY MOUTH ONCE DAILY 90 each 3   ??? FLUoxetine (PROZAC) 40 MG capsule Take 2 capsules (80 mg total) by mouth daily. 180 capsule 3   ??? gabapentin (NEURONTIN) 300 MG capsule Take 1 capsule (300 mg total) by mouth two (2) times a day. 180 capsule 3   ??? insulin ASPART (NOVOLOG FLEXPEN U-100 INSULIN) 100 unit/mL (3 mL) injection pen Inject 0.2 mL (20 Units total) under the skin three (3) times a day with a meal. 15 mL 11   ??? insulin detemir U-100 (LEVEMIR) 100 unit/mL (3 mL) injection pen Inject 0.4 mL (40 Units total) under the skin nightly. 15 mL 3   ??? insulin syringe-needle U-100 0.3 mL 31 gauge x 5/16 Syrg Use to inject insulin twice daily with meals 60 each 0   ??? isosorbide mononitrate (IMDUR) 30 MG 24 hr tablet Take 4 tablets (120 mg total) by mouth daily. 120 tablet 3   ??? nitroglycerin (NITROSTAT) 0.4 MG SL tablet Place 1 tablet (0.4 mg total) under the tongue every five (5) minutes as needed for chest pain. 100 each 3   ??? omeprazole (PRILOSEC) 40 MG capsule Take 1 capsule (40 mg total) by mouth Two (2) times a day (30 minutes before a meal). 180 capsule 3   ??? ondansetron (ZOFRAN) 4 MG tablet Take 1 tablet (4 mg total) by mouth every eight (8) hours as needed for nausea for up to 4 days. 10 tablet 0   ??? rivaroxaban (XARELTO) 15 mg Tab Take 1 tablet (15 mg total) by mouth daily with evening meal. 90 tablet 3   ??? semaglutide 0.25 mg or 0.5 mg(2 mg/1.5 mL) PnIj Inject 0.5 mg under the skin every seven (7) days. 4.5 mL 3   ??? sevelamer (RENVELA) 800 mg tablet TAKE 2 TABLETS (1600MG ) BY MOUTH THREE TIMES DAILY WITH MEALS 540 tablet 3   ??? sodium polystyrene, SPS, with sorbitol (SPS, WITH SORBITOL,) 15-20 gram/60 mL Susp Take 60 mL (15 g) by mouth once a week. 473 mL 3   ??? ticagrelor (BRILINTA) 90 mg Tab TAKE 1 TABLET BY MOUTH TWICE DAILY 180 tablet 3   ??? UNIFINE PENTIPS 31 gauge x 5/16 Ndle USE WITH INSULIN AND VICTOZA 5 TIMES DAILY 400 each 3   ??? [DISCONTINUED] liraglutide (VICTOZA 2-PAK) 0.6 mg/0.1 mL (18 mg/3 mL) injection Inject 0.1 mL (0.6 mg total) under the skin daily. 3 mL 1   ??? [DISCONTINUED] traZODone (DESYREL) 50 MG tablet Take 1 tablet (50 mg total) by mouth nightly. 90 tablet 3     No current facility-administered medications on file prior to visit.            SLEEPING:   Not good  Has been at the hospital since Saturday          APPETITE:    I'm starving - he says he's waiting for his lunch and hasn't had anything since before 9pm to eat and to drink since 12am          SUBSTANCE USE:  no        SESSION FOCUS:    1. Adjustment disorder, unspecified type 2. Situational stress          I learned Mr. Bruna had an MI from Gwen Her RN and Dr. Hulan Fess.  I contacted patient and provided a phone visit to address adjustment disorder and situational stress    Discussed recent medical event:  Had pain and slurring on Saturday. Also had some sxs Friday.  911 would only take him to [facility]  Had a friend drive him to the hospital  Pain in his legs when he stands up  Waiting on results of a stress test and other tests.    He attributes MI to stress with daughter and being cut off from contact with his grandson.  Daughter called him with grandson when she learned about MI      Still living with a friend.  Still hoping to rent a trailer in the near future    He thinks he'll need to work in order to help support him and his wife who is also out of work related to health issues.  Explored and discussed his intent for work and realistic vs unrealistic expectations.        Discussed issues presented and options for continued and/or improved self care management             Additional:     Medication adherence and barriers to the treatment plan have been addressed. Opportunities to optimize healthy behaviors have been discussed. Patient / caregiver voiced understanding.       Patient participated in visit from his [facility] room   Patient indicated that:      they felt safe in their setting to engage in phone counseling visit.    I spent 30 minutes on the phone with the patient. I spent an additional 2 minutes on pre- and post-visit activities.     The patient was physically located in [place] or a state in which I am permitted to provide care. The patient and/or parent/guardian understood that s/he [place] incur co-pays and cost sharing, and agreed to the telemedicine visit. The visit was reasonable and appropriate under the circumstances given the patient's presentation at the time.    The patient and/or parent/guardian has been advised of the potential risks and limitations of this mode of treatment (including, but not limited to, the absence of in-person examination) and has agreed to be treated using telemedicine. The patient's/patient's family's questions regarding telemedicine have been answered.     If the visit was completed in an ambulatory setting, the patient and/or parent/guardian has also been advised to contact their provider???s office for worsening conditions, and seek emergency medical treatment and/or call 911 if the patient deems either necessary.            ASSESSMENT      ENGAGEMENT: Patient presented a willingness to participate in treatment.  PARTICIPATION QUALITY:    Active  MOOD:   'ok today'  AFFECT:  REACTIVE   MENTAL STATUS:     alert and oriented. His speech sounded as though he had a cold. He denied any continued difficulty with speech to his knowledge.  MODES OF INTERVENTION used in this session:    Exploration, Support or Reframing      TREATMENT PLAN    Provided phone counseling following MI and hospitalization    Identified treatment goals:????improve self care management, develop / improve stress management skills  or healthy sleep hygiene  ??  Patient-stated health goal:????support    Mr. Delaughter is aware he can reach me through My Chart or my confidential voicemail or can call to schedule a follow up appointment as helpful.

## 2018-10-18 NOTE — Unmapped (Signed)
I was the supervising physician in the delivery of the service. Jhania Etherington D Gabrianna Fassnacht, MD

## 2018-10-18 NOTE — Unmapped (Addendum)
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 NSTEMI, h/o CAD  The patient has significant CAD with multiple prior cardiac interventions including recent balloon angioplasty of LAD. He is admitted with chest pain and diagnosed with NSTEMI. Troponin peaked at 1 and trend has now been flat at 0.5.  He is awaiting PET stress test today per weekend team for risk stratification.  Chest pain free today.  If low risk, would recommend medical management as patient has significant CKD.     NPO for stress test. Continue aspirin, ticagrelor, and statin.  Continue anti-anginals with amlodipine, imdur, and carvedilol.  Could consider Renexa if patient is refractory to current anti-anginals.     2. Hyperlipidemia  Continue high intensity statin and zetia    3. Hypertension  Blood pressure controlled with current medication.    4. Paroxysmal Atrial fibrillation  Rhythm control on amiodarone.  Xarelto is held as patient is on heparin drip.       Interval Events and Subjective:   - No chest pain this morning  - NPO awaiting stress test for risk stratification  - AM labs with BUN 39, creatinine 3.89    Medications:   Current Facility-Administered Medications   Medication Dose Route Frequency Provider Last Rate Last Dose   ??? acetaminophen (TYLENOL) tablet 650 mg  650 mg Oral Q4H PRN Hurman Horn, MD   650 mg at 10/17/18 1801   ??? albuterol 2.5 mg /3 mL (0.083 %) nebulizer solution 2.5 mg  2.5 mg Nebulization Q6H PRN Naseem Daphene Jaeger, MD       ??? aluminum-magnesium hydroxide-simethicone (MAALOX MAX) 80-80-8 mg/mL oral suspension  30 mL Oral Q4H PRN Naseem Daphene Jaeger, MD       ??? amiodarone (PACERONE) tablet 200 mg  200 mg Oral Daily Naseem Daphene Jaeger, MD   200 mg at 10/17/18 0845   ??? amLODIPine (NORVASC) tablet 10 mg  10 mg Oral Daily Naseem Daphene Jaeger, MD   10 mg at 10/17/18 0845   ??? aspirin chewable tablet 81 mg  81 mg Oral Daily Rica Koyanagi, MD   81 mg at 10/17/18 0845   ??? atorvastatin (LIPITOR) tablet 80 mg  80 mg Oral Daily Naseem Daphene Jaeger, MD   80 mg at 10/17/18 0845   ??? bumetanide (BUMEX) tablet 2 mg  2 mg Oral Daily Naseem Daphene Jaeger, MD   2 mg at 10/17/18 0846   ??? calcium carbonate (TUMS) chewable tablet 400 mg of elem calcium  400 mg of elem calcium Oral BID PRN Naseem Daphene Jaeger, MD       ???  carvediloL (COREG) tablet 25 mg  25 mg Oral BID Hurman Horn, MD   25 mg at 10/17/18 2057   ??? dextrose 50 % in water (D50W) 50 % solution 12.5 g  12.5 g Intravenous Q10 Min PRN Naseem Daphene Jaeger, MD       ??? FLUoxetine (PROzac) capsule 80 mg  80 mg Oral Daily Naseem Daphene Jaeger, MD   80 mg at 10/17/18 0846   ??? gabapentin (NEURONTIN) capsule 300 mg  300 mg Oral BID Hurman Horn, MD   300 mg at 10/17/18 2057   ??? guaiFENesin (ROBITUSSIN) oral syrup  200 mg Oral Q4H PRN Naseem Daphene Jaeger, MD       ??? heparin (porcine) 1000 unit/mL injection 2,000 Units  2,000 Units Intravenous Q6H PRN Rica Koyanagi, MD       ??? heparin 25,000 Units/250 mL (100 units/mL) in 0.45% saline infusion (premade)  12 Units/kg/hr Intravenous Continuous Rica Koyanagi, MD 18.44 mL/hr at 10/17/18 1914 12 Units/kg/hr at 10/17/18 1914   ??? insulin glargine (LANTUS) injection 40 Units  40 Units Subcutaneous Nightly Rica Koyanagi, MD   40 Units at 10/17/18 2058   ??? insulin lispro (HumaLOG) injection 0-12 Units  0-12 Units Subcutaneous ACHS Rica Koyanagi, MD   2 Units at 10/17/18 2057   ??? isosorbide mononitrate (IMDUR) 24 hr tablet 120 mg  120 mg Oral Daily Rica Koyanagi, MD   120 mg at 10/17/18 0846   ??? melatonin tablet 6 mg  6 mg Oral Nightly PRN Hurman Horn, MD   6 mg at 10/16/18 2049   ??? nitroglycerin (NITROSTAT) 2 % ointment 1 inch  1 inch Topical Q6H PRN Zannie Cove, MD   Stopped at 10/17/18 0051   ??? ondansetron (ZOFRAN-ODT) disintegrating tablet 8 mg  8 mg Oral Q8H PRN Hurman Horn, MD        Or   ??? ondansetron (ZOFRAN) injection 4 mg  4 mg Intravenous Q8H PRN Naseem Daphene Jaeger, MD       ??? pantoprazole (PROTONIX) EC tablet 40 mg  40 mg Oral BID Naseem Daphene Jaeger, MD   40 mg at 10/17/18 2057   ??? polyethylene glycol (MIRALAX) packet 17 g  17 g Oral Daily PRN Naseem Daphene Jaeger, MD       ??? QUEtiapine (SEROquel) tablet 50 mg  50 mg Oral BID PRN Hurman Horn, MD       ??? senna (SENOKOT) tablet 2 tablet  2 tablet Oral Nightly PRN Hurman Horn, MD       ??? sevelamer (RENVELA) tablet 800 mg  800 mg Oral 3xd Meals Naseem Daphene Jaeger, MD   800 mg at 10/17/18 1802   ??? ticagrelor (BRILINTA) tablet 90 mg  90 mg Oral BID Hurman Horn, MD   90 mg at 10/17/18 2057         Objective:   Patient Vitals for the past 8 hrs:   BP Temp Temp src Pulse SpO2 Pulse Resp SpO2   10/18/18 0400 134/69 36.9 ??C (98.4 ??F) Oral 70 70 23 98 %     No intake/output data recorded.    Physical Exam:  Gen: Pleasant male lying in bed comfortably.  Neck: Large neck. Difficult to assess JVP  Resp: Clear to auscultation bilaterally with normal work of breathing.  CV: Regular rate and rhythm without murmurs, rubs, or gallops.  Abd: Soft, non-tender, and non-distended without hepatosplenomegaly.  Obese.   Ext:  No lower extremity edema.    Test Results  Data Review:    All lab results last 24 hours:    Recent Results (from the past 24 hour(s))   Troponin I    Collection Time: 10/17/18 10:07 AM   Result Value Ref Range    Troponin I 0.570 (HH) <0.060 ng/mL   POCT Glucose    Collection Time: 10/17/18 11:29 AM   Result Value Ref Range    Glucose, POC 199 (H) 70 - 179 mg/dL   POCT Glucose    Collection Time: 10/17/18  4:59 PM   Result Value Ref Range    Glucose, POC 258 (H) 70 - 179 mg/dL   POCT Glucose    Collection Time: 10/17/18  8:06 PM   Result Value Ref Range    Glucose, POC 191 (H) 70 - 179 mg/dL   Basic Metabolic Panel    Collection Time: 10/18/18  5:35 AM   Result Value Ref Range    Sodium 139 135 - 145 mmol/L    Potassium 4.8 3.5 - 5.0 mmol/L    Chloride 108 (H) 98 - 107 mmol/L    CO2 22.0 22.0 - 30.0 mmol/L    Anion Gap 9 7 - 15 mmol/L    BUN 39 (H) 7 - 21 mg/dL    Creatinine 1.61 (H) 0.70 - 1.30 mg/dL    BUN/Creatinine Ratio 10     EGFR CKD-EPI Non-African American, Male 17 (L) >=60 mL/min/1.28m2    EGFR CKD-EPI African American, Male 20 (L) >=60 mL/min/1.31m2    Glucose 124 70 - 179 mg/dL    Calcium 8.8 8.5 - 09.6 mg/dL   APTT    Collection Time: 10/18/18  5:35 AM   Result Value Ref Range    APTT 27.5 25.3 - 37.1 sec    Heparin Correlation 0.2    Troponin I    Collection Time: 10/18/18  5:35 AM   Result Value Ref Range    Troponin I 0.580 (HH) <0.060 ng/mL   POCT Glucose    Collection Time: 10/18/18  7:30 AM   Result Value Ref Range    Glucose, POC 90 70 - 179 mg/dL     ECG: A paced rhythm. TWI in lateral leads

## 2018-10-18 NOTE — Unmapped (Addendum)
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 y/o male that presents to [facility] with NSTEMI (non-ST elevated myocardial infarction) (CMS-[name]).    1. NSTEMI/CAD:  Known significant CAD and admitted with NSTEMI.  Symptoms resolved.  Plan for PET stress test today in light of need for further intervention which Carboni be limited by previous treatments and CKD.  Continue current medical therapy including heparin infusion, ticagrelor, carvedilol and atorvastatin.  Will decide on further evaluation after PET stress.    2. HTN:  Reasonable control on current regimen.  No change today.    3. PAF:  On amiodarone.  Atrial paced rhythm on most recent ECG.  Oral AC on hold while on heparin.  Restart after heparin.      4. CKD Stage IV:  No change in Cr this am.  Hyperkalemia from earlier in admission now resolved.  Continue to monitor, avoid nephrotoxins as much as possible.    __________________________________________________________________    ATTENDING ADDENDUM:  PET stress with large reversible anterior defect with decreased LVEF.  Cardiology recommending transfer to [facility] Cardiology service for medical optimization and catheterization in light of high risk lesion.  No active chest pain, troponins have been stable.  Will continue current medical therapy for now including heparin.    ___________________________________________________________________    Subjective:    No further chest or neck pain.  No dyspnea at rest, no palpitations.  Did not eat much last night.  Having regular BMs.  Aware of plan for stress test today, felt a little anxious about getting it done.    Review of Systems - General ROS: no fevers  Respiratory ROS: no dyspnea, cough, sputum  Gastrointestinal ROS: having good BM, no nausea, vomiting    Objective:    Vital signs in last 24 hours:  Temp:  [36.7 ??C (98.1 ??F)-37 ??C (98.6 ??F)] 36.9 ??C (98.4 ??F)  Heart Rate:  [70-86] 70  SpO2 Pulse:  [70-78] 70  Resp:  [19-23] 23  BP: (108-150)/(44-83) 134/69  MAP (mmHg):  [67-103] 88  SpO2:  [97 %-98 %] 98 %    Intake/Output last 3 shifts:  I/O last 3 completed shifts:  In: 960 [P.O.:960]  Out: 3230 [Urine:3230]    Physical Exam:  GEN:  obese, pleasant, lying in bed, appears comfortable in no distress  EYES:  pupils equal, reactive to light and anicteric  ENT:  OP moist and No lesions  CV:  quiet heart sounds, RRR, Normal S1, S2 and No S3 or murmur  PULM:  Clear and Good air entry  ABD:  soft, nontender, nondistended and good bowel sounds  EXT:  trace edema in  ankles and Equal distal pulses  NEURO:  Alert and Oriented x 3    Medications:  Scheduled Meds:  ??? amiodarone  200 mg Oral Daily   ??? amLODIPine  10 mg Oral Daily   ??? aspirin  81 mg Oral Daily   ??? atorvastatin  80 mg Oral Daily   ??? bumetanide  2 mg Oral Daily   ??? carvediloL  25 mg Oral BID   ??? FLUoxetine  80 mg Oral Daily   ??? gabapentin  300 mg Oral BID   ??? insulin glargine  40 Units Subcutaneous Nightly   ??? insulin lispro  0-12 Units Subcutaneous ACHS   ??? isosorbide mononitrate  120 mg Oral Daily   ??? pantoprazole  40 mg Oral BID   ??? sevelamer  800 mg Oral 3xd Meals   ??? ticagrelor  90 mg Oral BID     Continuous Infusions:  ??? heparin 12 Units/kg/hr (10/17/18 1914)     PRN Meds:.acetaminophen, albuterol, aluminum-magnesium hydroxide-simethicone, calcium carbonate, dextrose 50 % in water (D50W), guaiFENesin, heparin (porcine), melatonin, nitroglycerin, ondansetron **OR** ondansetron, polyethylene glycol, QUEtiapine, senna    Labs/Studies:  Labs/Studies reviewed by me.  Notable for values below.    Recent Labs   Lab Units 10/15/18  0629 10/15/18  0012   WBC 10*9/L 9.6 10.7   RBC 10*12/L 4.01* 4.04*   HEMOGLOBIN g/dL 16.1* 09.6*   HEMATOCRIT % 33.6* 33.8*   MCV fL 83.8 83.5   MCH pg 26.6 26.1   MCHC g/dL 04.5 40.9   RDW % 81.1* 18.7*   PLATELET COUNT (1) 10*9/L 240 254   MPV fL 7.4 8.2       Recent Labs   Lab Units 10/18/18  0535 10/17/18  0526 10/16/18  0919   SODIUM mmol/L 139 136 137   POTASSIUM mmol/L 4.8 5.4* 6.1*   CHLORIDE mmol/L 108* 102 109*   CO2 mmol/L 22.0 22.0 23.0   BUN mg/dL 39* 37* 41*   CREATININE mg/dL 9.14* 7.82* 9.56*   GLUCOSE mg/dL 213 086 578*       No results in the last week    Recent Labs   Lab Units 10/15/18  0012   INR  0.96

## 2018-10-19 DIAGNOSIS — R079 Chest pain, unspecified: Principal | ICD-10-CM

## 2018-10-19 LAB — BASIC METABOLIC PANEL
BLOOD UREA NITROGEN: 39 mg/dL — ABNORMAL HIGH (ref 7–21)
BUN / CREAT RATIO: 9
CALCIUM: 8.3 mg/dL — ABNORMAL LOW (ref 8.5–10.2)
CHLORIDE: 103 mmol/L (ref 98–107)
CO2: 25 mmol/L (ref 22.0–30.0)
CREATININE: 4.13 mg/dL — ABNORMAL HIGH (ref 0.70–1.30)
EGFR CKD-EPI AA MALE: 18 mL/min/{1.73_m2} — ABNORMAL LOW (ref >=60)
EGFR CKD-EPI NON-AA MALE: 16 mL/min/{1.73_m2} — ABNORMAL LOW (ref >=60)
POTASSIUM: 5.1 mmol/L — ABNORMAL HIGH (ref 3.5–5.0)
SODIUM: 137 mmol/L (ref 135–145)

## 2018-10-19 LAB — CBC
HEMATOCRIT: 28.7 % — ABNORMAL LOW (ref 41.0–53.0)
HEMOGLOBIN: 9.1 g/dL — ABNORMAL LOW (ref 13.5–17.5)
MEAN CORPUSCULAR HEMOGLOBIN CONC: 31.5 g/dL (ref 31.0–37.0)
MEAN CORPUSCULAR HEMOGLOBIN: 26 pg (ref 26.0–34.0)
MEAN CORPUSCULAR VOLUME: 82.5 fL (ref 80.0–100.0)
RED BLOOD CELL COUNT: 3.48 10*12/L — ABNORMAL LOW (ref 4.50–5.90)
RED CELL DISTRIBUTION WIDTH: 18.8 % — ABNORMAL HIGH (ref 12.0–15.0)
WBC ADJUSTED: 9.1 10*9/L (ref 4.5–11.0)

## 2018-10-19 LAB — LIPID PANEL
CHOLESTEROL/HDL RATIO SCREEN: 4.4 (ref <5.0)
CHOLESTEROL: 191 mg/dL (ref 100–199)
HDL CHOLESTEROL: 43 mg/dL (ref 40–59)
NON-HDL CHOLESTEROL: 148 mg/dL
VLDL CHOLESTEROL CAL: 30.8 mg/dL (ref 12–47)

## 2018-10-19 LAB — CHOLESTEROL: Cholesterol:MCnc:Pt:Ser/Plas:Qn:: 191

## 2018-10-19 LAB — CHLORIDE: Chloride:SCnc:Pt:Ser/Plas:Qn:: 103

## 2018-10-19 LAB — HEPARIN CORRELATION: Lab: 0.2

## 2018-10-19 LAB — PLATELET COUNT: Platelets:NCnc:Pt:Bld:Qn:Automated count: 209

## 2018-10-19 MED ORDER — CARVEDILOL 25 MG TABLET
ORAL_TABLET | Freq: Two times a day (BID) | ORAL | 3 refills | 90 days | Status: CP
Start: 2018-10-19 — End: ?

## 2018-10-19 MED ORDER — RANOLAZINE ER 500 MG TABLET,EXTENDED RELEASE,12 HR
ORAL_TABLET | Freq: Two times a day (BID) | ORAL | 0 refills | 30.00000 days
Start: 2018-10-19 — End: 2018-10-19

## 2018-10-19 MED ORDER — ISOSORBIDE MONONITRATE ER 30 MG TABLET,EXTENDED RELEASE 24 HR
ORAL_TABLET | Freq: Every day | ORAL | 3 refills | 30 days | Status: CP
Start: 2018-10-19 — End: ?
  Filled 2018-10-26: qty 120, 30d supply, fill #0

## 2018-10-19 MED ORDER — APIXABAN 5 MG TABLET
ORAL_TABLET | Freq: Two times a day (BID) | ORAL | 0 refills | 30.00000 days
Start: 2018-10-19 — End: 2018-10-19

## 2018-10-19 NOTE — Unmapped (Signed)
Problem: Adult Inpatient Plan of Care  Goal: Plan of Care Review  Outcome: Progressing  Goal: Patient-Specific Goal (Individualization)  Outcome: Progressing  Goal: Absence of Hospital-Acquired Illness or Injury  Outcome: Progressing  Goal: Optimal Comfort and Wellbeing  Outcome: Progressing  Goal: Readiness for Transition of Care  Outcome: Progressing  Goal: Rounds/Family Conference  Outcome: Progressing     Problem: Fall Injury Risk  Goal: Absence of Fall and Fall-Related Injury  Outcome: Progressing     Problem: Diabetes Comorbidity  Goal: Blood Glucose Level Within Desired Range  Outcome: Progressing     Problem: Heart Failure Comorbidity  Goal: Maintenance of Heart Failure Symptom Control  Outcome: Progressing     Problem: Arrhythmia/Dysrhythmia (Heart Failure)  Goal: Stable Heart Rate and Rhythm  Outcome: Progressing     Problem: Functional Ability Impaired (Heart Failure)  Goal: Optimal Functional Ability  Outcome: Progressing   VSS.  Pt admitted from [facility] for possible Cardiac catheterization.  Pt NPO at midnight.  Heparin gtt at 13 u/kg/hr, aptt ordered and due at 2330.  Glucose monitored and administered IV Insulin.  Bed in low position, call bell within reach, will continue to monitor.

## 2018-10-19 NOTE — Unmapped (Signed)
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 *      Jeremy Oliver is a 48 year old male with a past medical history of Coronary artery disease s/p 6 prior PCIs, ICM (EF 40 to 45% ) with prior STEMI in 2018 associated with V.tach arrest, hypertension,   Afib/aflutter (on xarelto @ home), and CKD IV( baseline 3.5~3.8) and DM. He was admitted to OSH for recurrence of chest pain and elevation in troponin, and subsequently underwent stress testing here at main which showed a large reversible defect. He is scheduled for PCI tomorrow.      NSTEMI, h/o CAD  Stress Test: - Abnormal myocardial perfusion study  - There is a large in size, severe, completely reversible defect involving the apical, apical inferior, apical lateral, apical anterior, mid anterior, mid anterolateral and basal anterolateral segments. This is consistent with ischemia.  - During stress: Global systolic function is severely reduced. The ejection fraction calculated at 26%. Left ventricular chamber size is moderately dilated.  - Attenuation CT scan shows post PCI findings   This is a patient with significant CAD, with multiple prior interventions. He was admitted at OSH for chest pain and diagnosed with NSTEMI. His troponin peaked at 1 and decreased to 0.5. His PET scan above makes him a candidate for cath however given his CKD it puts him at risk for needing dialysis. The patient fully acknowledges the risk and is concerned about his heart and would prefer to undergo PCI    -PCI tomorrow  -Heparin Gtt  -Continue ASA 81mg   -Continue Ticagrelor 90mg    -Continue Coreg 25mg   -Continue Isosorbide mononitrate 24 hr 120mg   -Atorvastatin 80mg     PAF: He is currently on Amiodarone. His most recent Rhythm on ECG was A-Paced. His xarelto is on hold while he is getting heparin    - cont Amiodarone 200 Q D  - Heparin Gtt ---> will restart Xarelto at discharge    Hx of VT s/p ICD:   Currently on amiodarone 200mg  daily    HFrEF: 2/2 ischemic cardiomyopathy.TTE 05/2018 showed EF of 40-45%??with segmental wall motion abnormalities in the distribution of the anterior descending artery, unchanged from the previous study of 05/13/2018.     -Cont home coreg 25mg   -Bumex 2mg  daily  - not on ACE/ARB or spiro d/t renal fx and upcoming cath    Hypertension:   - Coreg 25mg   -Isosorbide mononitrate 24hr 120mg     Type 2 Diabetes Mellitus: Appears to be controlled on lantus 40 with sliding scale  -Lantus 40U  -SSI    CKD IV ( baseline 3.5~3.8):     -  Avoid nephrotoxic drugs ( unfortunately will need PCI tomorrow)  -BMP in AM               ___________________________________________________________________    Chief Complaint:  Chief Complaint   Patient presents with   ??? Chest Pain     NSTEMI (non-ST elevated myocardial infarction) (CMS-[name])    HPI:  Jeremy Oliver is a 48 y.o. male with PMHx as reviewed in the EMR who presented to [facility] with NSTEMI (non-ST elevated myocardial infarction) (CMS-[name]). Jeremy Oliver has a long standing history of coronary artery disease and NSTEMI/STEMI. Per patient he initially presented to the [facility] with reports of chest pain that lasted about 4 hours on 7/17. It was associated with nausea and vomiting. This pain resolved temporarily with nitro. He was also recently admitted earlier this month with a peak troponin of 0.4 but was found to be a poor candidate for left heart cath given advanced CKD and concern for progression to dialysis. At that time he was discharged on atorvastatin, Imdur and amlodipine. It was felt at this time that the patient should undergo stress testing for risk stratification. He was found to have a large area of reversibility on testing and was then sent to [facility] main for PCI. He currently denies any chest pain and states he has no shortness of breath. He is well aware of the risk of undergoing PCI with his current kidney function. He states  I have only one heart, I could potentially live on dialysis but I cant without my heart, I just want to get this procedure done with. The sooner the better.         Allergies:  Losartan and Morphine    Medications:   Prior to Admission medications    Medication Dose, Route, Frequency   acetaminophen (TYLENOL) 500 MG tablet TAKE 2 TABLETS (1 000MG ) BY MOUTH EVERY 8 HOURS AS NEEDED FOR PAIN   albuterol HFA 90 mcg/actuation inhaler Inhale 2 puffs into the lungs every 6 (six) hours as needed for wheezing or shortness of breath.   alirocumab (PRALUENT) 150 mg/mL subcutaneous injection 150 mg, Subcutaneous, Every 14 days   allopurinoL (ZYLOPRIM) 100 MG tablet 50 mg, Oral, Daily (standard)   amiodarone (PACERONE) 200 MG tablet TAKE 1 TABLET BY MOUTH ONCE DAILY   amLODIPine (NORVASC) 10 MG tablet 10 mg, Oral, Daily (standard)   arm brace Misc 1 each, Miscellaneous, Daily   atorvastatin (LIPITOR) 80 MG tablet 80 mg, Oral, Daily (standard)   blood sugar diagnostic Strp USE TO CHECK BLOOD SUGAR 4 TIMES DAILY (BEFORE MEALS AND NIGHTLY)   blood-glucose meter kit Use to check blood sugars, #1 meter, #100 strips. Freestyle glucose meter. ICD-9 250.00   bumetanide (BUMEX) 2 MG tablet 2 mg, Oral, Daily (standard)   carvediloL (COREG) 25 MG tablet TAKE 1 TABLET BY MOUTH WITH THE MORNING MEAL AND TAKE 2 TABLETS WITH THE EVENING MEAL   cholecalciferol, vitamin D3, 5,000 unit capsule 5,000 Units, Oral, Daily (standard)   diclofenac sodium (VOLTAREN) 1 % gel Apply 2 grams topically Three (3) times a day.   empty container Misc Use as directed to dispose of injectable medications   ezetimibe (ZETIA) 10 mg tablet TAKE 1 TABLET BY MOUTH ONCE DAILY   FLUoxetine (PROZAC) 40 MG capsule 80 mg, Oral, Daily (standard)   gabapentin (NEURONTIN) 300 MG capsule 300 mg, Oral, 2 times a day   insulin ASPART (NOVOLOG FLEXPEN U-100 INSULIN) 100 unit/mL (3 mL) injection pen Inject 0.2  mL (20 Units total) under the skin three (3) times a day with a meal.   insulin detemir U-100 (LEVEMIR) 100 unit/mL (3 mL) injection pen 40 Units, Subcutaneous, Nightly   insulin syringe-needle U-100 0.3 mL 31 gauge x 5/16 Syrg Use to inject insulin twice daily with meals   isosorbide mononitrate (IMDUR) 30 MG 24 hr tablet 90 mg, Oral, Daily (standard)   nitroglycerin (NITROSTAT) 0.4 MG SL tablet 0.4 mg, Sublingual, Every 5 min PRN   omeprazole (PRILOSEC) 40 MG capsule 40 mg, Oral, 2 times a day (AC)   ondansetron (ZOFRAN) 4 MG tablet 4 mg, Oral, Every 8 hours PRN   QUEtiapine (SEROQUEL) 50 MG tablet 50 mg, Oral, 2 times a day PRN   rivaroxaban (XARELTO) 15 mg Tab 15 mg, Oral, Daily   semaglutide 0.25 mg or 0.5 mg(2 mg/1.5 mL) PnIj 0.5 mg, Subcutaneous, Every 7 days   sevelamer (RENVELA) 800 mg tablet TAKE 2 TABLETS (1600MG ) BY MOUTH THREE TIMES DAILY WITH MEALS   sodium polystyrene, SPS, with sorbitol (SPS, WITH SORBITOL,) 15-20 gram/60 mL Susp Take 60 mL (15 g) by mouth once a week.   ticagrelor (BRILINTA) 90 mg Tab TAKE 1 TABLET BY MOUTH TWICE DAILY   UNIFINE PENTIPS 31 gauge x 5/16 Ndle USE WITH INSULIN AND VICTOZA 5 TIMES DAILY   liraglutide (VICTOZA 2-PAK) 0.6 mg/0.1 mL (18 mg/3 mL) injection 0.6 mg, Subcutaneous, Daily   traZODone (DESYREL) 50 MG tablet 50 mg, Oral, Nightly       Medical History:  Past Medical History:   Diagnosis Date   ??? Angina at rest (CMS-[name])    ??? Arthritis    ??? Asthma    ??? Atrial fibrillation and flutter (CMS-[name]) 06/08/14   ??? BMI 40.0-44.9, adult (CMS-[name]) 10/29/2015   ??? Chronic anticoagulation 02/17/2018   ??? Chronic kidney disease, stage 3 (CMS-[name])    ??? Chronic pain syndrome 04/11/2014    ~Use daily lyrica as recommended ~Tramadol as needed for pain ~Fluoxetine daily as recommended ~Daily exercise ~Eat a healthy diet ~Good control of your blood sugars can help    ??? Coronary artery disease    ??? Depression    ??? Diabetes mellitus (CMS-[name])    ??? Eye trauma 1998    glass in both eyes and removed   ??? GERD (gastroesophageal reflux disease)    ??? Gout    ??? Homeless    ??? Hyperlipidemia    ??? Hypertension    ??? Illiteracy and low-level literacy    ??? Microscopic hematuria    ??? Migraine    ??? Nephrolithiasis    ??? 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 Nonalcoholic fatty liver disease    ??? NSTEMI (non-ST elevated myocardial infarction) (CMS-[name]) 01/13/2017   ??? Obesity    ??? Obstructive sleep apnea 2009   ??? Panic attacks    ??? 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 Seizure-like activity (CMS-[name])     ~Monitor for symptoms and report them to your primary care provider if they occur    ??? Systolic CHF, chronic (CMS-[name])  EF 40-45% 2017   ??? TIA (transient ischemic attack)        Surgical History:  Past Surgical History:   Procedure Laterality Date   ??? CARDIAC CATHETERIZATION  03/08/2007   ??? CYSTOURETHROSCOPY 12/31/2009     Microscopic hematuria   ??? KNEE SURGERY Right 09/23/2006    arthroscopic knee surgery medial and lateral meniscus tears as well as crystal disease   ??? PR CATH PLACE/CORON ANGIO, IMG SUPER/INTERP,R&L HRT CATH, L HRT VENTRIC N/A 02/13/2017    Procedure: Left/Right Heart Catheterization;  Surgeon: Marlaine Hind, MD;  Location: [facility] CATH;  Service: Cardiology   ??? PR CATH PLACE/CORON ANGIO, IMG SUPER/INTERP,W LEFT HEART VENTRICULOGRAPHY N/A 03/17/2017    Procedure: Left Heart Catheterization W Intervention;  Surgeon: Marlaine Hind, MD;  Location: [facility] CATH;  Service: Cardiology   ??? PR CATH PLACE/CORON ANGIO, IMG SUPER/INTERP,W LEFT HEART VENTRICULOGRAPHY N/A 05/12/2018    Procedure: CATH LEFT HEART CATHETERIZATION W INTERVENTION;  Surgeon: Dorathy Kinsman, MD;  Location: [facility] CATH;  Service: Cardiology   ??? PR ECMO/ECLS INITIATION VENO-ARTERIAL N/A 03/19/2017    Procedure: ECMO/ECLS; INITIATION, VENO-ARTERIAL;  Surgeon: Lennie Odor, MD;  Location: MAIN OR [facility];  Service: Cardiac Surgery   ??? PR ECMO/ECLS RMVL PRPH CANNULA OPEN 6 YRS & OLDER Left 03/25/2017    Procedure: ECMO / ECLS PROVIDED BY PHYSICIAN; REMOVAL OF PERIPHERAL (ARTERIAL AND/OR VENOUS) CANNULA(E), OPEN, 6 YEARS AND OLDER;  Surgeon: Alonna Buckler Ikonomidis, MD;  Location: MAIN OR [facility];  Service: Cardiac Surgery   ??? PR EPHYS EVAL W/ ABLATION SUPRAVENT ARRHYTHMIA N/A 07/19/2015    Catheter ablation of cavotricuspid isthmus for atrial flutter [facility])   ??? PR INSER HART PACER XVENOUS ATRIAL N/A 05/30/2015    [employer] dual-chamber pacemaker implant (E.Chung)   ??? PR INSERT/PLACE FLOW DIRECT CATH N/A 03/17/2017    Procedure: Insert Leave In [place];  Surgeon: Marlaine Hind, MD;  Location: [facility] CATH;  Service: Cardiology   ??? PR INSJ/RPLCMT PERM DFB W/TRNSVNS LDS 1/DUAL CHMBR N/A 05/04/2017    Procedure: ICD Implant System (Single/Dual);  Surgeon: Eldred Manges, MD;  Location: [facility] CATH;  Service: Cardiology   ??? PR NEGATIVE PRESSURE WOUND THERAPY DME >50 SQ CM Left 03/25/2017    Procedure: Neg Press Wound Tx (Vac Assist) Incl Topicals, Per Session, Tsa Greater Than/= 50 Cm Squared;  Surgeon: Alonna Buckler Ikonomidis, MD;  Location: MAIN OR [facility];  Service: Cardiac Surgery   ??? PR PRQ TRLUML CORONARY STENT W/ANGIO ONE ART/BRNCH N/A 03/12/2018    Procedure: Percutaneous Coronary Intervention;  Surgeon: Marlaine Hind, MD;  Location: [facility] CATH;  Service: Cardiology   ??? PR REBL VES DIRECT,LOW EXTREM Left 03/19/2017    Procedure: Repr Bld Vessel Direct; Lower Extrem;  Surgeon: Boykin Reaper, MD;  Location: MAIN OR [facility];  Service: Vascular   ??? PR REMV ART CLOT ILIAC-POP,LEG INCIS Left 03/25/2017    Procedure: EMBOLECTOMY OR THROMBECTOMY, WITH OR WITHOUT CATHETER; FEMOROPOPLITEAL, AORTOILIAC ARTERY, BY LEG INCISION;  Surgeon: Alonna Buckler Ikonomidis, MD;  Location: MAIN OR [facility];  Service: Cardiac Surgery   ??? PR UPPER GI ENDOSCOPY,BIOPSY N/A 02/28/2016    Procedure: UGI ENDOSCOPY; WITH BIOPSY, SINGLE OR MULTIPLE;  Surgeon: Liane Comber, MD;  [facility]: [facility];  Service: Gastroenterology   ??? PR UPPER GI ENDOSCOPY,DIAGNOSIS N/A 05/07/2016    Procedure: UGI ENDO, INCLUDE ESOPHAGUS, STOMACH, & DUODENUM &/OR JEJUNUM; DX W/WO COLLECTION SPECIMN, BY BRUSH OR WASH;  Surgeon: Modena Nunnery, MD;  Location:  [facility];  Service: Gastroenterology       Social History:  Social History     Socioeconomic History   ??? Marital status: Married     Spouse name: [name]   ??? Number of children: 3   ??? Years of education: 60   ??? Highest education level: Not on file   Occupational History   ??? Occupation: DISABLED     Comment: Former Surveyor, quantity Needs   ??? Financial resource strain: Very hard   ??? Food insecurity     Worry: Not on file     Inability: Not on file   ??? Transportation needs     Medical: Yes     Non-medical: Yes   Tobacco Use   ??? Smoking status: Never Smoker   ??? Smokeless tobacco: Current User     Types: Chew   ??? Tobacco comment: Pt dips 1 can a week, Pt not interested in quitting    Substance and Sexual Activity   ??? Alcohol use: Never     Alcohol/week: 0.0 standard drinks     Frequency: Never     Comment: rare use: couple times a year, small amount   ??? Drug use: No   ??? Sexual activity: Yes     Partners: Female   Lifestyle   ??? Physical activity     Days per week: Not on file     Minutes per session: Not on file   ??? Stress: Not on file   Relationships   ??? Social Wellsite geologist on phone: More than three times a week     Gets together: More than three times a week     Attends religious service: 1 to 4 times per year     Active member of club or organization: No     Attends meetings of clubs or organizations: Never     Relationship status: Married   Other Topics Concern   ??? Do you use sunscreen? No   ??? Tanning bed use? No   ??? Are you easily burned? No   ??? Excessive sun exposure? No   ??? Blistering sunburns? No   Social History Narrative    Information updated:  01/08/16. Reviewed 08/12/17:        Born in [place]    Raised with both parents    Parents died w/in 6 mos of each other. His parents knew they were going to pass and told him just before.    Half sister and 2 half brothers        Married 30 yrs. Wife [name]    2 other children son and daughter, adopted out    Daughter Bridgette, bf DJ        Education - completed through 11th. 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    Former Presenter, broadcasting since age 18    Lives with wife, difficulty paying bills 12/30/17     Lives next door to daughter, husband and their grandson        05/19/18: pt moving to new home in next 5 days- after an eviction. Has Medicaid assistance again. Will be w/ wife [name], they babysit their grandson often.    06/14/18: Pt and husband living with daughter while  new location to live is determined.       Family History: Family History   Problem Relation Age of Onset   ??? Diabetes Mother    ??? Hyperlipidemia Mother    ??? Heart disease Mother    ??? Basal cell carcinoma Mother    ??? Blindness Mother         diabeties   ??? Obesity Mother    ??? Hyperlipidemia Father    ??? Heart disease Father    ??? Lung disease Father    ??? Heart disease Half-Sister    ??? Kidney disease Half-Sister    ??? Gallbladder disease Half-Brother    ??? Obesity Half-Brother    ??? No Known Problems Daughter    ??? No Known Problems Son    ??? Obesity Daughter    ??? Melanoma Neg Hx    ??? Squamous cell carcinoma Neg Hx    ??? Macular degeneration Neg Hx    ??? Glaucoma Neg Hx        Review of Systems:  10 systems reviewed and are negative unless otherwise mentioned in HPI    Labs/Studies:  Labs and Studies from the last 24hrs per EMR and Reviewed    Physical Exam:  Temp:  [36.6 ??C (97.8 ??F)-37 ??C (98.6 ??F)] 36.6 ??C (97.8 ??F)  Heart Rate:  [70-84] 84  SpO2 Pulse:  [70-78] 70  Resp:  [16-23] 16  BP: (81-150)/(46-83) 150/76  SpO2:  [98 %-100 %] 98 %    Physical Exam    HEENT:Normocephalic, Atraumatic, EOMI  CARD:S1/S2. No mrg, trace edema bilateral lower ext , peripheral pulses 2+ bilaterally  Pulm: CTA bilaterally, No wheezing or crackles, no increased work of breathing  Abd: normoactive, nontender,nondistended  Skin: skin intact, warm and dry  Neuro: A&Ox3 , no focal deficits     Geoffry Paradise, MS4    ???I attest that I have reviewed the student note and that the components of the history of the present illness, the physical exam, and the assessment and plan documented were performed by me or were performed in my presence by the student where I verified the documentation and performed (or re-performed) the exam and medical decision making.???    Blanche East, MD  Internal Medicine PGY-3

## 2018-10-19 NOTE — Unmapped (Signed)
PCP verified. PCP is Talbert Forest [phone]. Pt will schedule appt himself.   Please bring hospital discharge papers with you to this appointment.

## 2018-10-19 NOTE — Unmapped (Signed)
Physician Discharge Summary [facility]  3 AD [facility]  [address]  [place] [address]  Dept: [phone]  Loc: [phone]     Identifying Information:   Jeremy Oliver  [birth year]  [id number]    Primary Care Physician: Kurtis Bushman, MD   Code Status: Full Code    Admit Date: 10/15/2018    Discharge Date: 10/19/2018     Discharge To: Home    Discharge Service: CARD [facility] 2     Discharge Attending Physician: Rudene Anda, MD    Discharge Diagnoses:  Principal Problem:    NSTEMI (non-ST elevated myocardial infarction) (CMS-[name]) POA: Yes  Active Problems:    CAD (coronary artery disease) (RAF-[name]) POA: Yes    Hypercholesteremia POA: Yes    Essential hypertension (RAF-[name]) POA: Yes    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 (CMS-[name]) POA: Yes    Obesity, Class III, BMI 40-49.9 (morbid obesity) (CMS-[name]) POA: Yes    GERD (gastroesophageal reflux disease) POA: Yes    Mild intermittent asthma without complication POA: Yes    Paroxysmal atrial fibrillation (CMS-[name]) POA: Yes    Tobacco use disorder POA: Yes    Chronic heart failure with reduced ejection fraction and diastolic dysfunction (CMS-[name]) POA: Yes    Hyperkalemia POA: Yes    CKD (chronic kidney disease) stage 4, GFR 15-29 ml/min (CMS-[name]) POA: Yes    Type 2 diabetes mellitus with stage 4 chronic kidney disease, with long-term current use of insulin (CMS-[name]) POA: Not Applicable  Resolved Problems:    * No resolved hospital problems. *      Outpatient Provider Follow Up Issues:   [ ]  if tolerable patient would benefit from Spiro/ lisinopril therapy but difficult at this time due to ongoing renal dysfunction  [ ] Patient should be considered for Ranexa for maximal anti anginal therapy, unable to obtain prior auth on discharge  [ ]  Potassium slightly increased, would need a recheck BMP to ensure it has not risen to levels that warrant treatment ( ie. Loss through stool, dialysis )  [ ]  Will need to discuss ?triple therapy after LHC (ASA, Brillinta, Xarelto)  [ ]  LHC with Andrey Farmer scheduled for 7/27, arrive at 6:30am for pre-hydration    Hospital Course:       Jeremy Oliver is a 48 year old male with complex coronary artery disease s/p 6 PCI in past, HFrEF(40%-45%), Prior STEMI in 2018 associated with v.tach arrest, hypertension, Afib/Aflutter(on xareolto @ home), CKD IV(*3.5-3.8) and DMII.      NSTEMI/CAD   He was admitted to [facility] for recurrence of chest pain and mild elevation in troponin to 1.1, he remained hemodynamically stable and was maintained on a heparin drip while there and was seen by cardiology who felt he would benefit from PET stress to determine if he had high risk features vs low risk features compatible with medical management.He underwent stress testing here at main which showed a large reversible defect and was scheduled for PCI the following day. Upon further review and his extensive cardiac history along with his increased Cr it was felt that it would be best to defer the procedure. He remained on medical therapy including Coreg, atorvastatin, Imdur, ASA, and brillinta. He has had issues with Losartan in the past causing his potassium to increase above 6 along with current elevated K+, therefore he was not placed on this medication inpatient. His ASA was stopped at discharge as he would be on both xarelto and Brillinta, recommend discussion  regarding need for triple therapy after procedure.He will follow up for an outpatient cath on 7/27.    PAF: He was maintained on home amiodarone dose. His rhythm consistently remained A-Paced on EKG. He was transitioned back to his xarelto once PCI was deferred till later date.    Hx of VT s/p ICD: no episodes were noted on tele. He was given his amiodarone daily     HFrEF: His TTE in march of 2020 showed an EF of 40-45%, repeat echo on day of discharge showed an EF of 35-40% and grade I diastolic dysfunction. His Coreg dose was increased. Given his worsening renal fx and borderline K+ at 5.1 an ace inhibitor and spironolactone were not started. Recommend  He was continued on his home Bumex dose.    Hypertension: His blood pressure was treated with both  coreg and iso mononitrate.    Type 2 Diabetes Mellitus: His blood sugars were well controlled on lantus 40 U with addition of sliding scale.    CKD IV(~3.5-3.8): Nephrotoxic drugs were avoided. On the day of discharge his creatinine had increased to 4.13 prompting concern that in the case of a PCI he would most certainly need dialysis. Therefore, Cath case was deferred to a later date.       I attest that I have reviewed the student note and that the components of the history of the present illness, the physical exam, and the assessment and plan documented were performed by me or were performed in my presence by the student where I verified the documentation and performed (or re-performed) the exam and medical decision making.     Wyatt Mage, MD  Internal Medicine  PGY-2    ___________________________  Discharge Medications:     Your Medication List      START taking these medications    aspirin 81 MG chewable tablet  Chew 1 tablet (81 mg total) daily.  Start taking on: October 20, 2018        CHANGE how you take these medications    carvediloL 25 MG tablet  Commonly known as: COREG  Take 1.5 tablets (37.5 mg total) by mouth Two (2) times a day.  What changed:   ?? how much to take  ?? how to take this  ?? when to take this     isosorbide mononitrate 30 MG 24 hr tablet  Commonly known as: IMDUR  Take 4 tablets (120 mg total) by mouth daily.  What changed: how much to take        CONTINUE taking these medications    acetaminophen 500 MG tablet  Commonly known as: TYLENOL  TAKE 2 TABLETS (1 000MG ) BY MOUTH EVERY 8 HOURS AS NEEDED FOR PAIN  Notes to patient: Next dose: as needed     allopurinoL 100 MG tablet  Commonly known as: ZYLOPRIM  Take 0.5 tablets (50 mg total) by mouth daily.     amiodarone 200 MG tablet  Commonly known as: PACERONE  TAKE 1 TABLET BY MOUTH ONCE DAILY     amLODIPine 10 MG tablet  Commonly known as: NORVASC  Take 1 tablet (10 mg total) by mouth daily.     arm brace Misc  1 each by Miscellaneous route daily.     atorvastatin 80 MG tablet  Commonly known as: LIPITOR  Take 1 tablet (80 mg total) by mouth daily.     blood sugar diagnostic Strp  USE TO CHECK BLOOD SUGAR 4 TIMES DAILY (BEFORE MEALS  AND NIGHTLY)     blood-glucose meter kit  Use to check blood sugars, #1 meter, #100 strips. Freestyle glucose meter. ICD-9 250.00     BRILINTA 90 mg Tab  Generic drug: ticagrelor  TAKE 1 TABLET BY MOUTH TWICE DAILY     bumetanide 2 MG tablet  Commonly known as: BUMEX  Take 1 tablet (2 mg total) by mouth daily.     cholecalciferol (vitamin D3) 5,000 unit capsule  Take 1 capsule (5,000 Units total) by mouth daily.     diclofenac sodium 1 % gel  Commonly known as: VOLTAREN  Apply 2 grams topically Three (3) times a day.     empty container Misc  Use as directed to dispose of injectable medications     ezetimibe 10 mg tablet  Commonly known as: ZETIA  TAKE 1 TABLET BY MOUTH ONCE DAILY     FLUoxetine 40 MG capsule  Commonly known as: PROzac  Take 2 capsules (80 mg total) by mouth daily.     gabapentin 300 MG capsule  Commonly known as: NEURONTIN  Take 1 capsule (300 mg total) by mouth two (2) times a day.     insulin syringe-needle U-100 0.3 mL 31 gauge x 5/16 (8 mm) Syrg  Use to inject insulin twice daily with meals     LEVEMIR FLEXTOUCH U-100 INSULN 100 unit/mL (3 mL) injection pen  Generic drug: insulin detemir U-100  Inject 0.4 mL (40 Units total) under the skin nightly.     nitroglycerin 0.4 MG SL tablet  Commonly known as: NITROSTAT  Place 1 tablet (0.4 mg total) under the tongue every five (5) minutes as needed for chest pain.  Notes to patient: Next dose: as needed     NovoLOG Flexpen U-100 Insulin 100 unit/mL (3 mL) injection pen  Generic drug: insulin ASPART  Inject 0.2 mL (20 Units total) under the skin three (3) times a day with a meal.     omeprazole 40 MG capsule  Commonly known as: PriLOSEC  Take 1 capsule (40 mg total) by mouth Two (2) times a day (30 minutes before a meal).     ondansetron 4 MG tablet  Commonly known as: ZOFRAN  Take 1 tablet (4 mg total) by mouth every eight (8) hours as needed for nausea for up to 4 days.  Notes to patient: Next dose: as needed     OZEMPIC 0.25 mg or 0.5 mg(2 mg/1.5 mL) Pnij  Generic drug: semaglutide  Inject 0.5 mg under the skin every seven (7) days.  Notes to patient: Next dose: resume home schedule     PRALUENT PEN 150 mg/mL subcutaneous injection  Generic drug: alirocumab  Inject 1 mL (150 mg total) under the skin every fourteen (14) days.  Notes to patient: Next dose: resume home schedule     QUEtiapine 50 MG tablet  Commonly known as: SEROqueL  Take 1 tablet (50 mg total) by mouth two (2) times a day as needed (headache).  Notes to patient: Next dose: as needed     RENVELA 800 mg tablet  Generic drug: sevelamer  TAKE 2 TABLETS (1600MG ) BY MOUTH THREE TIMES DAILY WITH MEALS     SPS (WITH SORBITOL) 15-20 gram/60 mL Susp  Generic drug: sodium polystyrene (SPS) with sorbitol  Take 60 mL (15 g) by mouth once a week.  Notes to patient: Next dose: resume home schedule     UNIFINE PENTIPS 31 gauge x 5/16 (8 mm) Ndle  Generic drug: pen needle,  diabetic  USE WITH INSULIN AND VICTOZA 5 TIMES DAILY     VENTOLIN HFA 90 mcg/actuation inhaler  Generic drug: albuterol  Inhale 2 puffs into the lungs every 6 (six) hours as needed for wheezing or shortness of breath.  Notes to patient: Next dose: as needed     XARELTO 15 mg Tab  Generic drug: rivaroxaban  Take 1 tablet (15 mg total) by mouth daily with evening meal.            Allergies:  Losartan and Morphine  ______________________________________________________________________  Pending Test Results (if blank, then none):      Most Recent Labs:  All lab results last 24 hours -   Recent Results (from the past 24 hour(s))   APTT    Collection Time: 10/18/18  4:05 PM   Result Value Ref Range    APTT 27.9 25.3 - 37.1 sec    Heparin Correlation 0.2    POCT Glucose    Collection Time: 10/18/18  4:15 PM   Result Value Ref Range    Glucose, POC 157 70 - 179 mg/dL   aPTT    Collection Time: 10/18/18 10:18 PM   Result Value Ref Range    APTT 38.1 (H) 25.3 - 37.1 sec    Heparin Correlation 0.2    APTT    Collection Time: 10/19/18  8:35 AM   Result Value Ref Range    APTT 43.9 (H) 25.3 - 37.1 sec    Heparin Correlation 0.2    CBC    Collection Time: 10/19/18  8:35 AM   Result Value Ref Range    WBC 9.1 4.5 - 11.0 10*9/L    RBC 3.48 (L) 4.50 - 5.90 10*12/L    HGB 9.1 (L) 13.5 - 17.5 g/dL    HCT 16.1 (L) 09.6 - 53.0 %    MCV 82.5 80.0 - 100.0 fL    MCH 26.0 26.0 - 34.0 pg    MCHC 31.5 31.0 - 37.0 g/dL    RDW 04.5 (H) 40.9 - 15.0 %    MPV 8.1 7.0 - 10.0 fL    Platelet 209 150 - 440 10*9/L   Basic Metabolic Panel    Collection Time: 10/19/18  8:36 AM   Result Value Ref Range    Sodium 137 135 - 145 mmol/L    Potassium 5.1 (H) 3.5 - 5.0 mmol/L    Chloride 103 98 - 107 mmol/L    CO2 25.0 22.0 - 30.0 mmol/L    Anion Gap 9 7 - 15 mmol/L    BUN 39 (H) 7 - 21 mg/dL    Creatinine 8.11 (H) 0.70 - 1.30 mg/dL    BUN/Creatinine Ratio 9     EGFR CKD-EPI Non-African American, Male 16 (L) >=60 mL/min/1.29m2    EGFR CKD-EPI African American, Male 18 (L) >=60 mL/min/1.86m2    Glucose 186 (H) 70 - 179 mg/dL    Calcium 8.3 (L) 8.5 - 10.2 mg/dL   Lipid Panel    Collection Time: 10/19/18  8:36 AM   Result Value Ref Range    Triglycerides 154 (H) 1 - 149 mg/dL    Cholesterol 914 782 - 199 mg/dL    HDL 43 40 - 59 mg/dL    LDL Calculated 956 (H) 60 - 99 mg/dL    VLDL Cholesterol Cal 30.8 12 - 47 mg/dL    Chol/HDL Ratio 4.4 <2.1    Non-HDL Cholesterol 148 mg/dL    FASTING Unknown    Echocardiogram  Follow Up/Limited Echo With Contrast    Collection Time: 10/19/18 10:05 AM   Result Value Ref Range    LV Diastolic Diameter PLAX 5.3 cm    LV Systolic Diameter PLAX 3.8 cm    IVS Diastolic Thickness 1.2 cm    LVPW Diastolic Thickness 1.3 cm    Aortic Root Diameter 3.7 cm    LA Systolic Diameter LX 4.2 cm    LA Area 4C View 28.6 cm2    AV Peak Velocity 1.2 m/s    AV Peak Gradient 5.0     Mitral E Point Velocity 0.0 m/s    Mitral A Point Velocity 0.0 m/s    Mitral E to A Ratio 1.2     TR Peak Velocity 2.0 m/s    PV Peak Velocity 1.1 m/s    LV E' Lateral Velocity 0.1 m/s    Pulmonary Artery Systolic Pressure 17.0 mmHg    PV peak gradient 5.00 mmHg    MITRAL E TO MV E' RATIO 10.8 unitless    Est PA Systolic Pressure 16     TAPSE 26.5 mm    TASV 11.9 cm/sec   POCT Glucose    Collection Time: 10/19/18 11:46 AM   Result Value Ref Range    Glucose, POC 146 70 - 179 mg/dL       Relevant Studies/Radiology (if blank, then none):  Pet Ct Myocardial Perfusion Multiple    Result Date: 10/18/2018  Exam: PET MYOCARDIAL PERFUSION MULTIPLE (Rest/Stress Rb - Regadenoson)     +++++++++++++++++++++++++++++++++++++++++++++++++ Impressions: - Abnormal myocardial perfusion study - There is a large in size, severe, completely reversible defect involving the apical, apical inferior, apical lateral, apical anterior, mid anterior, mid anterolateral, mid inferolateral and basal anterolateral segments. This is consistent with ischemia. - During stress: Global systolic function is severely reduced. The ejection fraction calculated at 26%. Left ventricular chamber size is moderately dilated. - Attenuation CT scan shows post PCI findings and dual chamber pacemaker leads in the right atrium and right ventricle.     +++++++++++++++++++++++++++++++++++++++++++++++++     Name: Jeremy Oliver, Jeremy Oliver (Male) Date of Birth: March 18, 1971 [phone]) Accession #: [id number] UN ID: [id number] Date of Procedure: 10/18/2018 Primary Attending: Joneen Roach Fellow: Verne Carrow, MD     Clinical Indications: chest pain and elevated cardiac biomarkers Cardiac Risk Factors: known CAD, family history, diabetes, tobacco, hypertension, hyperlipidemia, CKD and obesity Past Cardiac Procedures: s/p dual-chamber ICD (05/2017) Medications: none reported Other Conditions: none reported     Protocol: Rest/Stress Rb - Regadenoson The patient was brought to the PET/CT laboratory and underwent an attenuation CT scan. The patient was given 40.2 mCi of rubidium Rb-82 intravenously and had the resting images performed. The patient was given a 0.4 mg dose of regadenoson intravenously over 15 seconds. After the IV administration of a total of 40.2 mCi of rubidium Rb-82, stress images were acquired. Following completion of stress imaging, the patient underwent an attenuation CT scan with the PET/CT scanner. Motion Issues: moderate Attenuation Issues: none reported Other Quality Issues: moderate adjacent gut activity noted     Clinical Findings: Ht: 75 in (191 cm) Wt: 339 lb (153.8 kg) BSA: 2.75 m2 BMI: 42.37 m2 Exercise: No exercise is performed for this type of protocol. Baseline: HR = 74 BP = 97/62 Peak: HR = 83 BP = 81/47 Baseline EKG: normal sinus rhythm, T wave inversion in lead(s) V5, V6 and prolonged QT Baseline Arrhythmia: none reported Stress EKG: no significant changes Stress Arrhythmia: none reported Stress Symptoms:  typical regadenoson symptoms     Nuclear Perfusion Findings: There is a large in size, severe, completely reversible defect involving the apical, apical inferior, apical lateral, apical anterior, mid anterior, mid anterolateral and basal anterolateral segments. This is consistent with ischemia.     Nuclear Wall Motion Findings: During stress: Global systolic function is severely reduced. The ejection fraction calculated at 26%. Left ventricular chamber size is moderately dilated.         I Joneen Roach) was available for the stress portion of the test.         Echocardiogram Follow Up/limited Echo With Contrast    Result Date: 10/19/2018  ?? Technically difficult study due to chest wall/lung interference ?? Ultrasound enhancing agent utilized to improve endocardial border definition ?? Moderately decreased left ventricular systolic function, ejection fraction 35 to 40% ?? Diastolic dysfunction - grade I (normal filling pressures) ?? Dilated left ventricle - mild ?? Dilated left atrium - mild ?? Normal right ventricular systolic function      ______________________________________________________________________  Discharge Instructions:   Activity Instructions     Activity as tolerated            Diet Instructions     Discharge diet (specify)      Discharge Nutrition Therapy: Heart Healthy          Other Instructions     Call MD for:  difficulty breathing, headache or visual disturbances      Call MD for:  extreme fatigue      Call MD for:  persistent dizziness or light-headedness      Call MD for:  persistent nausea or vomiting      Call MD for:  redness, tenderness, or signs of infection (pain, swelling, redness, odor or green/yellow discharge around incision site)      Call MD for:  severe uncontrolled pain      Call MD for: Temperature > 38.5 Celsius ( > 101.3 Fahrenheit)      Discharge instructions      It was a pleasure taking care of you!    You were admitted to [facility] for chest pain. This is due to your coronary artery disease. We have increased some of your medicines and hopefully this will help with your pain. We would like to get you onto a medication called Ranexa, however this requires prior authorization with your insurance. We recommend discussing with your cardiologist on getting this started. We will have you see Dr. Andrey Farmer on Monday 7/27 at 6:30am to undergo a heart catheterization.    Please carefully read and follow these instructions below upon your discharge:    1) Please take your medications as prescribed and note the changes listed on your discharge. At future follow-up appointments, please be sure to take all of your medications with you so your provider can better guide your care.     2) Seek medical care with your primary care doctor or local Emergency Room or Urgent Care if you develop any changes in your mental status, worsening abdominal pain, fevers greater than 101.5, any unexplained/unrelieved shortness of breath, uncontrolled nausea and vomiting that keeps you from remaining hydrated or taking your medication, or any other concerning symptoms.     3) Please go to your follow-up appointments. Some of your follow-up appointments have been listed below. If you do not see an appointment listed below with your primary care doctor, please call your doctor's office as soon as possible to schedule an appointment to be  seen within 7-10 days of discharge.     4) If you have any concerns before you are able to follow-up with your primary care doctor, you can reach [place] by calling [phone] and asking to page the Med C resident on call.               Follow Up instructions and Outpatient Referrals     Call MD for:  difficulty breathing, headache or visual disturbances      Call MD for:  extreme fatigue      Call MD for:  persistent dizziness or light-headedness      Call MD for:  persistent nausea or vomiting      Call MD for:  redness, tenderness, or signs of infection (pain, swelling, redness, odor or green/yellow discharge around incision site)      Call MD for:  severe uncontrolled pain      Call MD for: Temperature > 38.5 Celsius ( > 101.3 Fahrenheit)      Discharge instructions      Order to schedule follow-up visit with primary care physician            Appointments which have been scheduled for you    Oct 20, 2018 11:00 AM  (Arrive by 10:45 AM)  RETURN VIDEO - OTHER with Ebbie Latus, LCSW  [facility] INTERNAL MEDICINE [facility] REGION) [address] [place] [address]  [phone]      Oct 22, 2018 11:15 AM  (Arrive by 11:00 AM)  PT ORTHO TREATMENT with Isaac Laud, PT  [facility] PT ACC [facility] REGION) [address] [place] [address]  [phone]      Oct 22, 2018  1:30 PM  (Arrive by 1:15 PM)  RETURN  RHEUMATOLOGY with Monica Martinez, MD  [facility] RHEUMATOLOGY [address] [place] [facility] REGION) [phone] [address]  [place] [address]  [phone]      Oct 25, 2018  CATH LEFT HEART CATHETERIZATION with Marlaine Hind, MD  IMG CATH [facility] REGION) [address]  [place] [address]  [phone]   Oct 26, 2018  8:15 AM  (Arrive by 7:45 AM)  REMOTE ICD FOLLOW-UP with [facility] EP REMOTE MONITORING  [facility] EP REMOTE MONITORING [place] [facility] REGION) [address]  [address] [place] [address]  [phone]   This is a remote appointment, you do not need to come into the office.     Oct 27, 2018 11:15 AM  (Arrive by 11:00 AM)  PT ORTHO TREATMENT with Isaac Laud, PT  [facility] PT [place] [facility] REGION) [address] [place] [address]  [phone]      Nov 03, 2018 11:15 AM  (Arrive by 11:00 AM)  PT ORTHO TREATMENT with Isaac Laud, PT  [facility] PT [facility] REGION) [address] [place] [address]  [phone]      Nov 09, 2018  3:15 PM  (Arrive by 3:00 PM)  SPIROMETRY with [facility] INTERNAL MEDICINE  [facility] REGION) [address] [place] [address]  [phone]      Nov 10, 2018 11:15 AM  (Arrive by 11:00 AM)  PT ORTHO TREATMENT with Isaac Laud, PT  [facility] PT ACC [place] REGION) [address] [place] [address]  [phone]      Nov 17, 2018  8:30 AM  (Arrive by 8:15 AM)  RETURN VIDEO - OTHER with Roma Schanz, MD  [facility] INTERNAL MEDICINE [facility] REGION) [address] [place] [address]  [phone]      Nov 18, 2018  1:30 PM  (Arrive by 1:15 PM)  Danelle Earthly RETURN with Marlaine Hind, MD  [facility] HEART VASCULAR CTR [address] [place] [facility] REGION) [address]  [place] [address]  [phone]      Dec 07, 2018  1:30 PM  (Arrive by 1:15 PM)  RETURN  GENERAL with Mila Merry, MD  [place] NEPHROLOGY [facility]) [address]  [place] [address]  [phone]      Jan 25, 2019  8:15 AM  (Arrive by 7:45 AM)  REMOTE ICD FOLLOW-UP with [facility] EP REMOTE MONITORING  [facility] EP REMOTE MONITORING [facility] REGION) [address]  [address] [place] [address]  [phone]   This is a remote appointment, you do not need to come into the office.     Apr 26, 2019  8:15 AM  (Arrive by 7:45 AM)  REMOTE ICD FOLLOW-UP with [facility] EP REMOTE MONITORING  [facility] EP REMOTE MONITORING [place] [facility] REGION) [address]  [address] [place] [address]  [phone]   This is a remote appointment, you do not need to come into the office.     Jul 26, 2019  8:15 AM  (Arrive by 7:45 AM)  REMOTE ICD FOLLOW-UP with [facility] EP REMOTE MONITORING  [facility] EP REMOTE MONITORING [place] [facility] REGION) [address]  [address] [place] [address]  [phone]   This is a remote appointment, you do not need to come into the office.          ______________________________________________________________________  Discharge Day Services:  BP 126/60  - Pulse 76  - Temp 36.3 ??C (97.3 ??F) (Oral)  - Resp 17  - Ht 190.5 cm (6' 3)  - Wt (!) 153.7 kg (338 lb 13.6 oz)  - SpO2 97%  - BMI 42.35 kg/m??      Physical Exam    HEENT:Normocephalic, Atraumatic, EOMI  CARD:S1/S2. No mrg , peripheral pulses 2+ bilaterally, trace edema BLE  Pulm: CTA bilaterally, No wheezing or crackles, no increased work of breathing  Abd: normoactive, nontender,nondistended, obese  Skin: skin intact, warm and dry  Neuro: A&Ox3 , no focal deficits   Pt seen on the day of discharge and determined appropriate for discharge.    Condition at Discharge: stable    Length of Discharge: I spent greater than 30 mins in the discharge of this patient.

## 2018-10-19 NOTE — Unmapped (Signed)
Patient discharged home per order.  Discharge instructions reviewed with patient.  IV discontinued.  Patient understands his heart cath will be Monday.  To lobby via wheelchair.

## 2018-10-19 NOTE — Unmapped (Signed)
Tobacco Treatment Program  Phone Visit Note    This medical encounter was conducted virtually using [email or web address]  TeleHealth protocols.      I have identified myself to the patient and conveyed my credentials to Mr. Foote  I have explained the capabilities and limitations of telemedicine and the patient/proxy and myself both agree that it is appropriate for their current circumstances/symptoms. The patient/proxy has given me verbal consent to proceed.     Patient's location at the time of the telephone visit: Hospitalized at [facility]   Provider's location at the time of the telephone visit: At home, in [facility] Information  Person Contacted: Mr. Rubenstein                 Contact Phone number: [phone]       Phone Outcome: Talked to Pt   Is there someone else in the room? No.      Purpose of contact:     Mr. Modesitt is a 48 y.o. male is participating in a telephone visit for tobacco treatment counseling. Patient consented to telephone visit because of social distancing measures in place due to the COVID-19 pandemic.     NOTE:     SUMMARY: Sw utilized Motivational Interviewing techniques to assess Pt for treatment of tobacco use. Pt reported that he is in process of gradually reducing tobacco use. Pt expressed that his long term goal is to have 1 can of smokeless tobacco last him for one month. Pt states that currently 1 can will last him for approximately 2 weeks. Pt denied experience with cravings and subsequently declined NRT assistance. Sw discussed behavioral distractions associated with engaging in repetitive or simple activities whenever urges emerge. Sw encouraged Pt to be mindful of triggers and to enact oral strategies that incorporate the use of toothpicks, drinking straws, hard candy or lollipops to assist in alleviating cravings. Pt can reorganize some parts of their day and incorporate use oral strategies whenever cravings emerge to promote tobacco free lifestyle and to disassociate daily activities from tobacco use. SW provided pt with information on physical improvements related to tobacco cessation, and available resources.    Tobacco Use Treatment  Program: Hospital Inpatient  Type of Visit: Initial  Tobacco Use Treatment Visit: Talked with patient    Tobacco Use During Past 30 Days  Time Since Last Tobacco Use: 1 to 7 days ago  Tobacco Withdrawal (Past 24 Hours): None noted  Type of Tobacco Products Used: Smokeless  Quantity Used: 1  Quantity Per: week(1 can every 2 weeks )  Other Household Members Use Tobacco: No  Smoking Allowed in Home: Yes    Tobacco Use History  Previously seen by NDP?: Hospital Inpatient    Behavioral Assessment  Why Uses: 1. Habit 2. Stress   Barriers/Challenges: 1. Tobacco use ingrained in daily routines/activities   Strategies: 1. Sw discussed behavioral distractions associated with engaging in repetitive or simple activities whenever urges emerge 2. Sw encouraged Pt to be mindful of triggers and to enact oral strategies that incorporate the use of toothpicks, drinking straws, hard candy or lollipops to assist in alleviating cravings 3. Pt can reorganize some parts of their day and incorporate use oral strategies whenever cravings emerge to promote tobacco free lifestyle and to disassociate daily activities from tobacco use    Treatment Plan  Cessation Meds Currently Using: None  Medications Recommended During Hospitalization: Patient declines  Outpatient/Discharge Medications Recommended: Patient declines  Patient's Plan Post  Discharge/Visit: Reduce tobacco use  Comments/Notes: Pt in process of gradually reducing tobacco use. Pt states that his goal is for a can of smokeless tobacco to last him for about a month     TTS Information  Diagnosis: Tobacco use disorder, unspecified, uncomplicated (F17.200)  Interventions: Assessed, Behavioral techniques, Discussed, Informed, Motivational interviewing, Suggested, Taught, Encouraged, Supportive therapy, Relaxation Training, Psycho-education, Tx plan development  TTS Visit Length: >10 minutes    As part of this Telephone Visit, no in-person exam was conducted.    I spent 9 minutes on the telephone with the patient.   I spent an additional 10 minutes on pre- and post-visit activities (including charting, communication with medical team, and coordination of aftercare).      Visit Format/Coding: Telephone Call (Audio Only): Behavioral Health/Non-Physicians:  In charge capture section use [phone] (11-20 minutes)    Donita Brooks, MSW, LCSW, NCTTP  Clinical Social Worker/ Tobacco Treatment Specialist  Inpatient Tobacco Treatment Program   Phone: [phone]  Pager: [phone]

## 2018-10-20 ENCOUNTER — Telehealth: Admit: 2018-10-20 | Discharge: 2018-10-21 | Payer: MEDICARE | Attending: Clinical | Primary: Clinical

## 2018-10-20 DIAGNOSIS — F4320 Adjustment disorder, unspecified: Secondary | ICD-10-CM

## 2018-10-20 DIAGNOSIS — F439 Reaction to severe stress, unspecified: Principal | ICD-10-CM

## 2018-10-20 MED ORDER — ASPIRIN 81 MG CHEWABLE TABLET
ORAL_TABLET | Freq: Every day | ORAL | 3 refills | 90 days | Status: CP
Start: 2018-10-20 — End: 2018-10-19

## 2018-10-20 NOTE — Unmapped (Signed)
[facility] TRANSITIONS OF CARE                                                              SUMMARY NOTE    After a thorough review of the chart, this patient does not meet criteria for a Transitions Call at this time due to transition completed by specialty provider. Please contact [phone] with any further questions.    Lacey Jensen, RN  Transitions Case Technical sales engineer [facility]  [phone]

## 2018-10-20 NOTE — Unmapped (Signed)
CCM Follow-up Call    PCP Action:None     CCM Action:None    Patient Action:cardiac cath Friday. take meds as prescribed. call 911 active CP, SOB or other acute changes       Pt d/ced home 10/19/18 - cardiac cath scheduled Friday 10/22/18 @ 6:30am.  Wife [name] upset re: pt sent home without discharge instructions given to her first.  Discussed pt to go to ER for any c/o CP.  Reviewed pt not to take Xarelto starting today and not to take diabetic medication day of procedure, Consuelo Pandy understanding.    Patrica states is taking her medications for anxiety however is feeling very frustrated.  Re-directed conversation several times.  [name] to call back tomorrow w/ any new concerns.    Spoke to pt for 5 minutes. Pt states concerned about Friday b/c they said it was deep in the heart.  Discussed w/ pt to take it easy, as low stress as possible until Friday.  Pt agrees and is going to watch a light movie tonight. Pt states did ask inpatient team to call [name] prior to his being discharged. [name] has spoken to several providers on the phone since.   [name] has spoken to patient relations and is not satisfied with the outcome.    Reinforced to pt's wife that [facility] is not trying to upset her nor the pt.  [name] will call CCM again tomorrow with any new concerns.  Verbalizes understanding for 911/ER for any chest pain or acute changes.  Pt did speak to Diane SW today.

## 2018-10-20 NOTE — Unmapped (Signed)
INTERNAL MEDICINE COUNSELING SESSION NOTE    LENGTH OF SESSION:  Psychotherapy Session 45 minutes    TYPE OF PSYCHOTHERAPY: Behavioral, Supportive    REASON FOR TREATMENT: Problem Solving Treatment and Mind Body Stress Reduction skill training for??impact of health issues,??stress and self care management; link impact of effort to mood.        Patient participated in visit from his friend's home  Patient indicated that:      they felt safe in their setting to engage in phone counseling visit.    I spent 45 minutes on the real-time audio and video with the patient. I spent an additional 5 minutes on pre- and post-visit activities.     The patient was physically located in [place] or a state in which I am permitted to provide care. The patient and/or parent/guardian understood that s/he [place] incur co-pays and cost sharing, and agreed to the telemedicine visit. The visit was reasonable and appropriate under the circumstances given the patient's presentation at the time.    The patient and/or parent/guardian has been advised of the potential risks and limitations of this mode of treatment (including, but not limited to, the absence of in-person examination) and has agreed to be treated using telemedicine. The patient's/patient's family's questions regarding telemedicine have been answered.     If the visit was completed in an ambulatory setting, the patient and/or parent/guardian has also been advised to contact their provider???s office for worsening conditions, and seek emergency medical treatment and/or call 911 if the patient deems either necessary.             REVIEW OF FUNCTIONING SINCE LAST VISIT    MOOD (0-10) =  real tired    10  of 10 with 10 being the best. At last visit was   Deferred    SATISFACTION WITH EFFORT IN CARING FOR MOOD (0-10) =    10 of 10 with 10 being the best.  At last visit was   deferred    PAIN:     7 - my legs able to stand a little bit, using crutches   At last visit was    0 PHQ-9 SCORE:     0  At last visit was  10    GAD-7 SCORE:     0  At last visit was    18      CURRENT SUICIDAL/HOMICIDAL IDEATION:   Denies SI / HI      PROBLEM LIST:    Patient Active Problem List   Diagnosis   ??? CAD (coronary artery disease) (RAF-[name])   ??? Idiopathic chronic gout of left knee   ??? Hypercholesteremia   ??? Essential hypertension (RAF-[name])   ??? 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 Osteoarthritis   ??? Chronic nonalcoholic liver disease   ??? GERD (gastroesophageal reflux disease)   ??? Mild intermittent asthma without complication   ??? Chronic pain syndrome   ??? Paroxysmal atrial fibrillation (CMS-[name])   ??? OSA (obstructive sleep apnea)   ??? Tobacco use disorder   ??? Status post cardiac pacemaker procedure   ??? Diabetes mellitus with gastroparesis (CMS-[name])   ??? Cardiac pacemaker in situ   ??? Recurrent major depressive disorder, in full remission (CMS-[name])   ??? NSTEMI (non-ST elevated myocardial infarction) (CMS-[name])   ??? Adjustment disorder, unspecified   ??? Chronic heart failure with reduced ejection fraction and diastolic dysfunction (CMS-[name])   ???  Hyperkalemia   ??? CKD (chronic kidney disease) stage 4, GFR 15-29 ml/min (CMS-[name])   ??? Acquired left foot drop   ??? Decreased sensation of lower extremity, left   ??? Chronic anticoagulation   ??? Type 2 diabetes mellitus with stage 4 chronic kidney disease, with long-term current use of insulin (CMS-[name])   ??? Situational stress         MEDICATION COMPLIANCE:     Taking all medications regularly?  no     Any changes in medication since last visit?  yes - adjustment in rxs until surgery for another heart catherization Friday morning    Any missed doses? NA       Current Outpatient Medications on File Prior to Visit   Medication Sig Dispense Refill   ??? albuterol HFA 90 mcg/actuation inhaler Inhale 2 puffs into the lungs every 6 (six) hours as needed for wheezing or shortness of breath. 18 g 2   ??? alirocumab (PRALUENT) 150 mg/mL subcutaneous injection Inject 1 mL (150 mg total) under the skin every fourteen (14) days. 4 Syringe 11   ??? allopurinoL (ZYLOPRIM) 100 MG tablet Take 0.5 tablets (50 mg total) by mouth daily. 45 tablet 3   ??? amiodarone (PACERONE) 200 MG tablet TAKE 1 TABLET BY MOUTH ONCE DAILY 90 tablet 3   ??? amLODIPine (NORVASC) 10 MG tablet Take 1 tablet (10 mg total) by mouth daily. 90 tablet 3   ??? arm brace Misc 1 each by Miscellaneous route daily. 1 each 0   ??? atorvastatin (LIPITOR) 80 MG tablet Take 1 tablet (80 mg total) by mouth daily. 90 tablet 3   ??? blood sugar diagnostic Strp USE TO CHECK BLOOD SUGAR 4 TIMES DAILY (BEFORE MEALS AND NIGHTLY) 100 each 3   ??? blood-glucose meter kit Use to check blood sugars, #1 meter, #100 strips. Freestyle glucose meter. ICD-9 250.00 1 each 0   ??? bumetanide (BUMEX) 2 MG tablet Take 1 tablet (2 mg total) by mouth daily. 90 tablet 3   ??? carvediloL (COREG) 25 MG tablet Take 1.5 tablets (37.5 mg total) by mouth Two (2) times a day. 270 tablet 3   ??? cholecalciferol, vitamin D3, 5,000 unit capsule Take 1 capsule (5,000 Units total) by mouth daily. 100 capsule 0   ??? diclofenac sodium (VOLTAREN) 1 % gel Apply 2 grams topically Three (3) times a day. 100 g 11   ??? empty container Misc Use as directed to dispose of injectable medications 1 each 3   ??? ezetimibe (ZETIA) 10 mg tablet TAKE 1 TABLET BY MOUTH ONCE DAILY 90 each 3   ??? FLUoxetine (PROZAC) 40 MG capsule Take 2 capsules (80 mg total) by mouth daily. 180 capsule 3   ??? gabapentin (NEURONTIN) 300 MG capsule Take 1 capsule (300 mg total) by mouth two (2) times a day. 180 capsule 3   ??? insulin ASPART (NOVOLOG FLEXPEN U-100 INSULIN) 100 unit/mL (3 mL) injection pen Inject 0.2 mL (20 Units total) under the skin three (3) times a day with a meal. 15 mL 11   ??? insulin detemir U-100 (LEVEMIR) 100 unit/mL (3 mL) injection pen Inject 0.4 mL (40 Units total) under the skin nightly. 15 mL 3   ??? insulin syringe-needle U-100 0.3 mL 31 gauge x 5/16 Syrg Use to inject insulin twice daily with meals 60 each 0   ??? isosorbide mononitrate (IMDUR) 30 MG 24 hr tablet Take 4 tablets (120 mg total) by mouth daily. 120 tablet 3   ???  nitroglycerin (NITROSTAT) 0.4 MG SL tablet Place 1 tablet (0.4 mg total) under the tongue every five (5) minutes as needed for chest pain. 100 each 3   ??? omeprazole (PRILOSEC) 40 MG capsule Take 1 capsule (40 mg total) by mouth Two (2) times a day (30 minutes before a meal). 180 capsule 3   ??? ondansetron (ZOFRAN) 4 MG tablet Take 1 tablet (4 mg total) by mouth every eight (8) hours as needed for nausea for up to 4 days. 10 tablet 0   ??? QUEtiapine (SEROQUEL) 50 MG tablet Take 1 tablet (50 mg total) by mouth two (2) times a day as needed (headache). 180 tablet 3   ??? rivaroxaban (XARELTO) 15 mg Tab Take 1 tablet (15 mg total) by mouth daily with evening meal. 90 tablet 3   ??? semaglutide 0.25 mg or 0.5 mg(2 mg/1.5 mL) PnIj Inject 0.5 mg under the skin every seven (7) days. 4.5 mL 3   ??? sevelamer (RENVELA) 800 mg tablet TAKE 2 TABLETS (1600MG ) BY MOUTH THREE TIMES DAILY WITH MEALS 540 tablet 3   ??? sodium polystyrene, SPS, with sorbitol (SPS, WITH SORBITOL,) 15-20 gram/60 mL Susp Take 60 mL (15 g) by mouth once a week. 473 mL 3   ??? ticagrelor (BRILINTA) 90 mg Tab TAKE 1 TABLET BY MOUTH TWICE DAILY 180 tablet 3   ??? UNIFINE PENTIPS 31 gauge x 5/16 Ndle USE WITH INSULIN AND VICTOZA 5 TIMES DAILY 400 each 3   ??? [DISCONTINUED] liraglutide (VICTOZA 2-PAK) 0.6 mg/0.1 mL (18 mg/3 mL) injection Inject 0.1 mL (0.6 mg total) under the skin daily. 3 mL 1   ??? [DISCONTINUED] ranolazine (RANEXA) 500 MG 12 hr tablet Take by mouth two (2) times a day. 60 tablet 0   ??? [DISCONTINUED] traZODone (DESYREL) 50 MG tablet Take 1 tablet (50 mg total) by mouth nightly. 90 tablet 3     No current facility-administered medications on file prior to visit.            SLEEPING:  Got out of the hospital yesterday. Last night slept better.          APPETITE: halfway better, lower intake          SUBSTANCE USE:  no         SESSION FOCUS:  Addressed:  1. Situational stress    2. Adjustment disorder, unspecified type          Too many inerruptions for blood and test while at the hosptial which interrupted his sleep  Stent to go into the part of his heart where I died last time  His cardiac surgeon coming home early from vacation to do Jeremy Oliver vacation and he didn't want to wait    Discussed his thoughts and feelings about his health and upcoming surgery  Ok and feels present in his life and with possible outomces  Faith is strong and helping him through this time  Discussed advanced care    Advance Care Planning     Participants: Patient  Discussion/Action: He is leaving decision making to his wife and that he has been put this in writing  Wife Jeremy Oliver says she prefers to have decision making, role      Health Care Decision Maker as of 11/15/2018    [name]): Jeremy Oliver,Jeremy Oliver - Spouse - [phone]          Plan between today and surgery  He has already spoken with family in the past of his feelings and  last wishes  He is taking time to rest, and time with Trish to watch movies, and time with their dog     If I make it through this I want to get a wheel chair and spend a day at the [facility] I've never been there.            Additional:     Medication adherence and barriers to the treatment plan have been addressed. Opportunities to optimize healthy behaviors have been discussed. Patient / caregiver voiced understanding.          ASSESSMENT      ENGAGEMENT: Patient presented a willingness to participate in treatment.  PARTICIPATION QUALITY:    Active  MOOD:   real tired       AFFECT:  CONGRUENT   MENTAL STATUS:     alert and oriented  MODES OF INTERVENTION used in this session:    Clarification, Exploration, Support, Reframing or Week review      TREATMENT PLAN    Identified treatment goals:????improve self care management, develop / improve stress management skills or healthy sleep hygiene  ??  Patient-stated health goal:????support    Problem Solving Treatment and Mind Body Stress Reduction Skill Training:  Next visit will be   8 of up to 12 visits.    [facility]CO Wed 8/5 1pm Video - [employer]

## 2018-10-20 NOTE — Unmapped (Signed)
General Consent to Treat (GCT) for non-epic video visits only: Verbal consent    [name] reviewed and updated in Epic:  Completed or Not completed (Reason complete)      PHQ9  Thoughts that you would be better off dead, or of hurting yourself in some way: Not at all  PHQ-9 TOTAL SCORE: 0    GAD7  GAD-7 Total Score: 0

## 2018-10-21 NOTE — Unmapped (Signed)
I was the supervising physician in the delivery of the service. [name] D Fannie Gathright, MD

## 2018-10-22 ENCOUNTER — Ambulatory Visit: Admit: 2018-10-22 | Discharge: 2018-10-23 | Payer: MEDICARE

## 2018-10-22 LAB — BASIC METABOLIC PANEL
ANION GAP: 15 mmol/L (ref 7–15)
BLOOD UREA NITROGEN: 47 mg/dL — ABNORMAL HIGH (ref 7–21)
BUN / CREAT RATIO: 12
CALCIUM: 8.9 mg/dL (ref 8.5–10.2)
CHLORIDE: 103 mmol/L (ref 98–107)
CO2: 22 mmol/L (ref 22.0–30.0)
EGFR CKD-EPI AA MALE: 19 mL/min/{1.73_m2} — ABNORMAL LOW (ref >=60)
EGFR CKD-EPI NON-AA MALE: 16 mL/min/{1.73_m2} — ABNORMAL LOW (ref >=60)
POTASSIUM: 5.1 mmol/L — ABNORMAL HIGH (ref 3.5–5.0)
SODIUM: 140 mmol/L (ref 135–145)

## 2018-10-22 LAB — CALCIUM: Calcium:MCnc:Pt:Ser/Plas:Qn:: 8.9

## 2018-10-22 NOTE — Unmapped (Signed)
CARDIAC CATHETERIZATION REPORT- PRELIM NOTE   ??  Date of Procedure: October 22, 2018  ____________________________________________________________________________  Findings:  1.   Significant 90% stenosis of distal RCA   2.   Elevated LVEDP = 22   3.   Successful PCI to the distal RCA with the placement of a Synergy 4.0 x 20 with excellent angiographic result and TIMI 3 flow.  ??  Recommendations:  1. Aggressive secondary prevention.  2. Continue full anticoagulation and ticagrelor for at least 1 year.   3. Follow up with Dr. Andrey Farmer as scheduled  4. Close f/u with nephrology for monitoring of renal funciton, could consider PCI of the LCx for refractory symptoms  ??  Complications:  ?? None  ____________________________________________________________________________  Referring Physician: Dr. Emmaline Kluver   Primary Care Physician:  Kurtis Bushman  Primary Cardiologist: Dr. Andrey Farmer

## 2018-10-22 NOTE — Unmapped (Signed)
CARDIOLOGY PROCEDURE PROGRESS NOTE:   Admit Date: October 22, 2018         Attending: Marlaine Hind, MD    Primary Service: Cards Procedures (MDS)    Admitting Diagnosis:  NSTEMI (non-ST elevated myocardial infarction) (CMS-[name]) [I21.4]    Patient seen and evaluated post procedure.  I agree with H&P as recorded.    Left  radial artery puncture site with dressing clean, dry and intact. No bleeding or hematoma noted at puncture site. TR band in place.    Procedure:  10/22/2018 Coronary angiogram with percutaneous coronary intervention (PCI). See cath report for details.         ASSESSMENT AND PLAN      ASSESSMENT AND PLAN:    Jeremy Oliver is a 48 y.o.-year-old male with CAD who is admitted s/p coronary angiogram and percutaneous coronary intervention (PCI).     1. CAD s/p CATH/PCI.   * Monitor on telemetry overnight.  * Vital signs per protocol.   * Monitor radial arterial access site(s) for bleeding or hematoma.  * Continue ticagrelor 90mg  BID and Xarelto 15 mg daily.  * Continue atorvastatin 80 mg daily, Zetia 10 mg daily.   * Up ad lib after bedrest complete.  * Maintain IV/Medlock per protocol.  * Check 12 lead EKG and labs in AM.    2. HTN   * Continue home medications.     3. Type II DM  * Check blood glucose AC&HS and cover with sliding scale insulin (Lispro) per protocol.   *Lantus 40 units nightly.    DISPO:  * Admit Cardiology  * Floor status  * Anticipate discharge in AM after labs/EKG reviewed and patient hemodynamically stable overnight post-procedure.      FEN:    * IVF ordered by interventional cardiology fellow.  * Monitor/replete electrolytes prn  * Heart healthy diet    DVT/GI Prophylaxis:  * PPI ppx: GI proph not indicated as patient to be discharged <24 hours post-procedure.   * DVT ppx: anticoagulation as above and encourage ambulation after bedrest complete.      BEST PRACTICE:  BB: yes  If No-Reason/Contraindication: N/A    Statin: yes  If No-Reason/Contraindication: N/A    ASA: no  If No-Reason/Contraindication: Pt taking Xarelto    Antiplatelet agent: yes   If No-Reason/Contraindication: N/A   Cardiac Rehab Referral: Pt has already participated in cardiac rehab in the past.       Lab Results   Component Value Date    WBC 9.1 10/19/2018    HGB 9.1 (L) 10/19/2018    HCT 28.7 (L) 10/19/2018    PLT 209 10/19/2018       Lab Results   Component Value Date    NA 140 10/22/2018    K 5.1 (H) 10/22/2018    CL 103 10/22/2018    CO2 22.0 10/22/2018    BUN 47 (H) 10/22/2018    CREATININE 4.02 (H) 10/22/2018    GLU 274 (H) 10/22/2018    CALCIUM 8.9 10/22/2018    MG 1.9 10/15/2018    PHOS 4.2 09/30/2018       Lab Results   Component Value Date    LABPROT 10.5 10/16/2013    INR 0.96 10/15/2018    APTT 43.9 (H) 10/19/2018       Lab Results   Component Value Date    LDL 117 (H) 10/19/2018       Lab Results   Component Value Date    A1C  7.9 (H) 10/11/2018

## 2018-10-23 LAB — BASIC METABOLIC PANEL
ANION GAP: 10 mmol/L (ref 7–15)
BLOOD UREA NITROGEN: 45 mg/dL — ABNORMAL HIGH (ref 7–21)
BUN / CREAT RATIO: 12
CALCIUM: 8.8 mg/dL (ref 8.5–10.2)
CHLORIDE: 109 mmol/L — ABNORMAL HIGH (ref 98–107)
EGFR CKD-EPI AA MALE: 20 mL/min/{1.73_m2} — ABNORMAL LOW (ref >=60)
EGFR CKD-EPI NON-AA MALE: 17 mL/min/{1.73_m2} — ABNORMAL LOW (ref >=60)
GLUCOSE RANDOM: 105 mg/dL (ref 70–179)
POTASSIUM: 5.2 mmol/L — ABNORMAL HIGH (ref 3.5–5.0)

## 2018-10-23 LAB — CBC
HEMATOCRIT: 29.8 % — ABNORMAL LOW (ref 41.0–53.0)
MEAN CORPUSCULAR HEMOGLOBIN CONC: 31.8 g/dL (ref 31.0–37.0)
MEAN CORPUSCULAR HEMOGLOBIN: 26.1 pg (ref 26.0–34.0)
MEAN CORPUSCULAR VOLUME: 82.1 fL (ref 80.0–100.0)
MEAN PLATELET VOLUME: 7.9 fL (ref 7.0–10.0)
PLATELET COUNT: 321 10*9/L (ref 150–440)
RED BLOOD CELL COUNT: 3.63 10*12/L — ABNORMAL LOW (ref 4.50–5.90)
WBC ADJUSTED: 8.1 10*9/L (ref 4.5–11.0)

## 2018-10-23 LAB — MAGNESIUM: Magnesium:MCnc:Pt:Ser/Plas:Qn:: 2

## 2018-10-23 LAB — MEAN CORPUSCULAR HEMOGLOBIN: Lab: 26.1

## 2018-10-23 LAB — CHLORIDE: Chloride:SCnc:Pt:Ser/Plas:Qn:: 109 — ABNORMAL HIGH

## 2018-10-25 NOTE — Unmapped (Addendum)
CCM Follow-up Call    PCP Action:None        [name] calls in states pt w/ c/o transient HAs x 2 days.  Last 2 hours and then go away.     States pt's HA 6/10, however was warned that after recent cardiac cath (x 2 stents) - he would have headaches.    I asked to speak to pt Jeremy Oliver.    Jeremy Oliver states  He is fine, just a small HA. No c/p, no c/o SOB.    Resting and will call for any further concerns.    BP this morning 134/80, 73

## 2018-10-26 ENCOUNTER — Institutional Professional Consult (permissible substitution): Admit: 2018-10-26 | Discharge: 2018-10-27 | Payer: MEDICARE

## 2018-10-26 MED FILL — OMEPRAZOLE 40 MG CAPSULE,DELAYED RELEASE: 90 days supply | Qty: 180 | Fill #0 | Status: AC

## 2018-10-26 MED FILL — NOVOLOG FLEXPEN U-100 INSULIN ASPART 100 UNIT/ML (3 ML) SUBCUTANEOUS: SUBCUTANEOUS | 25 days supply | Qty: 15 | Fill #2

## 2018-10-26 MED FILL — NOVOLOG FLEXPEN U-100 INSULIN ASPART 100 UNIT/ML (3 ML) SUBCUTANEOUS: 25 days supply | Qty: 15 | Fill #2 | Status: AC

## 2018-10-26 MED FILL — DICLOFENAC 1 % TOPICAL GEL: 17 days supply | Qty: 100 | Fill #4 | Status: AC

## 2018-10-26 MED FILL — LEVEMIR FLEXTOUCH U-100 INSULIN 100 UNIT/ML (3 ML) SUBCUTANEOUS PEN: SUBCUTANEOUS | 37 days supply | Qty: 15 | Fill #2

## 2018-10-26 MED FILL — RENVELA 800 MG TABLET: 30 days supply | Qty: 180 | Fill #5

## 2018-10-26 MED FILL — DICLOFENAC 1 % TOPICAL GEL: TOPICAL | 17 days supply | Qty: 100 | Fill #4

## 2018-10-26 MED FILL — ISOSORBIDE MONONITRATE ER 30 MG TABLET,EXTENDED RELEASE 24 HR: 30 days supply | Qty: 120 | Fill #0 | Status: AC

## 2018-10-26 MED FILL — LEVEMIR FLEXTOUCH U-100 INSULIN 100 UNIT/ML (3 ML) SUBCUTANEOUS PEN: 37 days supply | Qty: 15 | Fill #2 | Status: AC

## 2018-10-26 MED FILL — VENTOLIN HFA 90 MCG/ACTUATION AEROSOL INHALER: RESPIRATORY_TRACT | 25 days supply | Qty: 18 | Fill #1

## 2018-10-26 MED FILL — RENVELA 800 MG TABLET: 30 days supply | Qty: 180 | Fill #5 | Status: AC

## 2018-10-26 MED FILL — VENTOLIN HFA 90 MCG/ACTUATION AEROSOL INHALER: 25 days supply | Qty: 18 | Fill #1 | Status: AC

## 2018-10-26 NOTE — Unmapped (Signed)
error 

## 2018-10-28 ENCOUNTER — Emergency Department: Payer: Medicare Other

## 2018-10-28 ENCOUNTER — Inpatient Hospital Stay
Admission: EM | Admit: 2018-10-28 | Discharge: 2018-10-30 | DRG: 315 | Discharge status: Against Medical Advice | Payer: Medicare Other | Attending: Internal Medicine | Admitting: Internal Medicine

## 2018-10-28 ENCOUNTER — Encounter: Payer: Self-pay | Admitting: Emergency Medicine

## 2018-10-28 ENCOUNTER — Other Ambulatory Visit: Payer: Self-pay

## 2018-10-28 DIAGNOSIS — I5022 Chronic systolic (congestive) heart failure: Secondary | ICD-10-CM | POA: Diagnosis present

## 2018-10-28 DIAGNOSIS — Z885 Allergy status to narcotic agent status: Secondary | ICD-10-CM | POA: Diagnosis not present

## 2018-10-28 DIAGNOSIS — I130 Hypertensive heart and chronic kidney disease with heart failure and stage 1 through stage 4 chronic kidney disease, or unspecified chronic kidney disease: Secondary | ICD-10-CM | POA: Diagnosis present

## 2018-10-28 DIAGNOSIS — Z888 Allergy status to other drugs, medicaments and biological substances status: Secondary | ICD-10-CM | POA: Diagnosis not present

## 2018-10-28 DIAGNOSIS — Z20828 Contact with and (suspected) exposure to other viral communicable diseases: Secondary | ICD-10-CM | POA: Diagnosis present

## 2018-10-28 DIAGNOSIS — Z833 Family history of diabetes mellitus: Secondary | ICD-10-CM

## 2018-10-28 DIAGNOSIS — E875 Hyperkalemia: Secondary | ICD-10-CM | POA: Diagnosis present

## 2018-10-28 DIAGNOSIS — E872 Acidosis: Secondary | ICD-10-CM | POA: Diagnosis present

## 2018-10-28 DIAGNOSIS — Z7902 Long term (current) use of antithrombotics/antiplatelets: Secondary | ICD-10-CM

## 2018-10-28 DIAGNOSIS — K529 Noninfective gastroenteritis and colitis, unspecified: Secondary | ICD-10-CM | POA: Diagnosis present

## 2018-10-28 DIAGNOSIS — Z955 Presence of coronary angioplasty implant and graft: Secondary | ICD-10-CM | POA: Diagnosis not present

## 2018-10-28 DIAGNOSIS — Z794 Long term (current) use of insulin: Secondary | ICD-10-CM | POA: Diagnosis not present

## 2018-10-28 DIAGNOSIS — I255 Ischemic cardiomyopathy: Secondary | ICD-10-CM | POA: Diagnosis present

## 2018-10-28 DIAGNOSIS — E785 Hyperlipidemia, unspecified: Secondary | ICD-10-CM | POA: Diagnosis present

## 2018-10-28 DIAGNOSIS — F17220 Nicotine dependence, chewing tobacco, uncomplicated: Secondary | ICD-10-CM | POA: Diagnosis present

## 2018-10-28 DIAGNOSIS — N2581 Secondary hyperparathyroidism of renal origin: Secondary | ICD-10-CM | POA: Diagnosis present

## 2018-10-28 DIAGNOSIS — R079 Chest pain, unspecified: Secondary | ICD-10-CM | POA: Diagnosis present

## 2018-10-28 DIAGNOSIS — R55 Syncope and collapse: Secondary | ICD-10-CM | POA: Diagnosis present

## 2018-10-28 DIAGNOSIS — I2510 Atherosclerotic heart disease of native coronary artery without angina pectoris: Secondary | ICD-10-CM | POA: Diagnosis present

## 2018-10-28 DIAGNOSIS — Z79891 Long term (current) use of opiate analgesic: Secondary | ICD-10-CM

## 2018-10-28 DIAGNOSIS — E1122 Type 2 diabetes mellitus with diabetic chronic kidney disease: Secondary | ICD-10-CM | POA: Diagnosis present

## 2018-10-28 DIAGNOSIS — I480 Paroxysmal atrial fibrillation: Secondary | ICD-10-CM | POA: Diagnosis present

## 2018-10-28 DIAGNOSIS — I340 Nonrheumatic mitral (valve) insufficiency: Secondary | ICD-10-CM | POA: Diagnosis not present

## 2018-10-28 DIAGNOSIS — I959 Hypotension, unspecified: Secondary | ICD-10-CM | POA: Diagnosis not present

## 2018-10-28 DIAGNOSIS — Z79899 Other long term (current) drug therapy: Secondary | ICD-10-CM | POA: Diagnosis not present

## 2018-10-28 DIAGNOSIS — N184 Chronic kidney disease, stage 4 (severe): Secondary | ICD-10-CM | POA: Diagnosis present

## 2018-10-28 DIAGNOSIS — R809 Proteinuria, unspecified: Secondary | ICD-10-CM | POA: Diagnosis present

## 2018-10-28 DIAGNOSIS — N289 Disorder of kidney and ureter, unspecified: Secondary | ICD-10-CM

## 2018-10-28 DIAGNOSIS — I252 Old myocardial infarction: Secondary | ICD-10-CM

## 2018-10-28 DIAGNOSIS — N179 Acute kidney failure, unspecified: Secondary | ICD-10-CM | POA: Diagnosis present

## 2018-10-28 LAB — URINALYSIS, COMPLETE (UACMP) WITH MICROSCOPIC
Bacteria, UA: NONE SEEN
Bilirubin Urine: NEGATIVE
Glucose, UA: 50 mg/dL — AB
Ketones, ur: NEGATIVE mg/dL
Leukocytes,Ua: NEGATIVE
Nitrite: NEGATIVE
Protein, ur: 100 mg/dL — AB
Specific Gravity, Urine: 1.008 (ref 1.005–1.030)
Squamous Epithelial / HPF: NONE SEEN (ref 0–5)
pH: 5 (ref 5.0–8.0)

## 2018-10-28 LAB — TROPONIN I (HIGH SENSITIVITY)
Troponin I (High Sensitivity): 11 ng/L (ref <18)
Troponin I (High Sensitivity): 10 ng/L (ref <18)

## 2018-10-28 LAB — C DIFFICILE QUICK SCREEN W PCR REFLEX
C Diff antigen: NEGATIVE
C Diff interpretation: NOT DETECTED
C Diff toxin: NEGATIVE

## 2018-10-28 LAB — COMPREHENSIVE METABOLIC PANEL
ALT: 8 U/L (ref 0–44)
AST: 11 U/L — ABNORMAL LOW (ref 15–41)
Albumin: 3.1 g/dL — ABNORMAL LOW (ref 3.5–5.0)
Alkaline Phosphatase: 65 U/L (ref 38–126)
Anion gap: 9 (ref 5–15)
BUN: 54 mg/dL — ABNORMAL HIGH (ref 6–20)
CO2: 17 mmol/L — ABNORMAL LOW (ref 22–32)
Calcium: 8.5 mg/dL — ABNORMAL LOW (ref 8.9–10.3)
Chloride: 108 mmol/L (ref 98–111)
Creatinine, Ser: 5.56 mg/dL — ABNORMAL HIGH (ref 0.61–1.24)
GFR calc Af Amer: 13 mL/min — ABNORMAL LOW (ref >60)
GFR calc non Af Amer: 11 mL/min — ABNORMAL LOW (ref >60)
Glucose, Bld: 113 mg/dL — ABNORMAL HIGH (ref 70–99)
Potassium: 5.5 mmol/L — ABNORMAL HIGH (ref 3.5–5.1)
Sodium: 134 mmol/L — ABNORMAL LOW (ref 135–145)
Total Bilirubin: 0.5 mg/dL (ref 0.3–1.2)
Total Protein: 7.4 g/dL (ref 6.5–8.1)

## 2018-10-28 LAB — CBC WITH DIFFERENTIAL/PLATELET
Abs Immature Granulocytes: 0.04 10*3/uL (ref 0.00–0.07)
Basophils Absolute: 0 10*3/uL (ref 0.0–0.1)
Basophils Relative: 1 %
Eosinophils Absolute: 0.4 10*3/uL (ref 0.0–0.5)
Eosinophils Relative: 6 %
HCT: 34.7 % — ABNORMAL LOW (ref 39.0–52.0)
Hemoglobin: 10.6 g/dL — ABNORMAL LOW (ref 13.0–17.0)
Immature Granulocytes: 1 %
Lymphocytes Relative: 20 %
Lymphs Abs: 1.5 10*3/uL (ref 0.7–4.0)
MCH: 25.5 pg — ABNORMAL LOW (ref 26.0–34.0)
MCHC: 30.5 g/dL (ref 30.0–36.0)
MCV: 83.4 fL (ref 80.0–100.0)
Monocytes Absolute: 0.9 10*3/uL (ref 0.1–1.0)
Monocytes Relative: 12 %
Neutro Abs: 4.6 10*3/uL (ref 1.7–7.7)
Neutrophils Relative %: 60 %
Platelets: 330 10*3/uL (ref 150–400)
RBC: 4.16 MIL/uL — ABNORMAL LOW (ref 4.22–5.81)
RDW: 17.2 % — ABNORMAL HIGH (ref 11.5–15.5)
WBC: 7.5 10*3/uL (ref 4.0–10.5)
nRBC: 0 % (ref 0.0–0.2)

## 2018-10-28 LAB — GASTROINTESTINAL PANEL BY PCR, STOOL (REPLACES STOOL CULTURE)

## 2018-10-28 LAB — SARS CORONAVIRUS 2 BY RT PCR (HOSPITAL ORDER, PERFORMED IN ~~LOC~~ HOSPITAL LAB): SARS Coronavirus 2: NEGATIVE

## 2018-10-28 LAB — GLUCOSE, CAPILLARY: Glucose-Capillary: 141 mg/dL — ABNORMAL HIGH (ref 70–99)

## 2018-10-28 LAB — LACTIC ACID, PLASMA: Lactic Acid, Venous: 1.5 mmol/L (ref 0.5–1.9)

## 2018-10-28 LAB — TSH: TSH: 6.529 u[IU]/mL — ABNORMAL HIGH (ref 0.350–4.500)

## 2018-10-28 LAB — MAGNESIUM: Magnesium: 2.2 mg/dL (ref 1.7–2.4)

## 2018-10-28 MED ORDER — SODIUM CHLORIDE 0.9 % IV BOLUS
1000.0000 mL | Freq: Once | INTRAVENOUS | Status: AC
  Administered 2018-10-28: 1000 mL via INTRAVENOUS

## 2018-10-28 MED ORDER — QUETIAPINE FUMARATE 25 MG PO TABS
50.0000 mg | ORAL_TABLET | Freq: Two times a day (BID) | ORAL | Status: DC | PRN
  Administered 2018-10-29: 50 mg via ORAL
  Filled 2018-10-28: qty 2

## 2018-10-28 MED ORDER — VITAMIN D3 25 MCG (1000 UNIT) PO TABS
5000.0000 [IU] | ORAL_TABLET | Freq: Every day | ORAL | Status: DC
  Administered 2018-10-29 – 2018-10-30 (×2): 5000 [IU] via ORAL
  Filled 2018-10-28 (×4): qty 5

## 2018-10-28 MED ORDER — ACETAMINOPHEN 325 MG PO TABS
650.0000 mg | ORAL_TABLET | Freq: Four times a day (QID) | ORAL | Status: DC | PRN
  Administered 2018-10-30: 06:00:00 650 mg via ORAL
  Filled 2018-10-28: qty 2

## 2018-10-28 MED ORDER — AMIODARONE HCL 200 MG PO TABS
200.0000 mg | ORAL_TABLET | Freq: Every day | ORAL | Status: DC
  Administered 2018-10-29 – 2018-10-30 (×2): 200 mg via ORAL
  Filled 2018-10-28 (×2): qty 1

## 2018-10-28 MED ORDER — ALBUTEROL SULFATE (2.5 MG/3ML) 0.083% IN NEBU: 2.5000 mg | INHALATION_SOLUTION | Freq: Four times a day (QID) | RESPIRATORY_TRACT | Status: DC | PRN

## 2018-10-28 MED ORDER — ACETAMINOPHEN 500 MG PO TABS: 500.0000 mg | ORAL_TABLET | Freq: Four times a day (QID) | ORAL | Status: DC | PRN

## 2018-10-28 MED ORDER — TICAGRELOR 90 MG PO TABS
90.0000 mg | ORAL_TABLET | Freq: Two times a day (BID) | ORAL | Status: DC
  Administered 2018-10-28 – 2018-10-30 (×4): 90 mg via ORAL
  Filled 2018-10-28 (×4): qty 1

## 2018-10-28 MED ORDER — INSULIN DETEMIR 100 UNIT/ML ~~LOC~~ SOLN
40.0000 [IU] | Freq: Every day | SUBCUTANEOUS | Status: DC
  Administered 2018-10-28 – 2018-10-29 (×2): 40 [IU] via SUBCUTANEOUS
  Filled 2018-10-28 (×3): qty 0.4

## 2018-10-28 MED ORDER — SEVELAMER CARBONATE 800 MG PO TABS
1600.0000 mg | ORAL_TABLET | Freq: Three times a day (TID) | ORAL | Status: DC
  Administered 2018-10-29 – 2018-10-30 (×4): 1600 mg via ORAL
  Filled 2018-10-28 (×4): qty 2

## 2018-10-28 MED ORDER — FLUOXETINE HCL 20 MG PO CAPS
60.0000 mg | ORAL_CAPSULE | Freq: Every day | ORAL | Status: DC
  Administered 2018-10-28 – 2018-10-29 (×2): 60 mg via ORAL
  Filled 2018-10-28 (×3): qty 3

## 2018-10-28 MED ORDER — SODIUM CHLORIDE 0.9% FLUSH: 3.0000 mL | Freq: Two times a day (BID) | INTRAVENOUS | Status: DC

## 2018-10-28 MED ORDER — GABAPENTIN 300 MG PO CAPS
300.0000 mg | ORAL_CAPSULE | Freq: Two times a day (BID) | ORAL | Status: DC
  Administered 2018-10-28 – 2018-10-30 (×4): 300 mg via ORAL
  Filled 2018-10-28 (×4): qty 1

## 2018-10-28 MED ORDER — EZETIMIBE 10 MG PO TABS
10.0000 mg | ORAL_TABLET | Freq: Every day | ORAL | Status: DC
  Administered 2018-10-29 – 2018-10-30 (×2): 10 mg via ORAL
  Filled 2018-10-28 (×2): qty 1

## 2018-10-28 MED ORDER — ATORVASTATIN CALCIUM 80 MG PO TABS
80.0000 mg | ORAL_TABLET | Freq: Every evening | ORAL | Status: DC
  Administered 2018-10-28 – 2018-10-29 (×2): 80 mg via ORAL
  Filled 2018-10-28 (×2): qty 1

## 2018-10-28 MED ORDER — SODIUM CHLORIDE 0.9 % IV SOLN
Freq: Once | INTRAVENOUS | Status: AC
  Administered 2018-10-28: 16:00:00 via INTRAVENOUS

## 2018-10-28 MED ORDER — PANTOPRAZOLE SODIUM 40 MG PO TBEC
40.0000 mg | DELAYED_RELEASE_TABLET | Freq: Every day | ORAL | Status: DC
  Administered 2018-10-29 – 2018-10-30 (×2): 40 mg via ORAL
  Filled 2018-10-28 (×2): qty 1

## 2018-10-28 MED ORDER — ONDANSETRON HCL 4 MG PO TABS: 4.0000 mg | ORAL_TABLET | Freq: Four times a day (QID) | ORAL | Status: DC | PRN

## 2018-10-28 MED ORDER — SODIUM CHLORIDE 0.9 % IV SOLN
INTRAVENOUS | Status: DC
  Administered 2018-10-28 – 2018-10-29 (×3): via INTRAVENOUS

## 2018-10-28 MED ORDER — ALLOPURINOL 100 MG PO TABS
50.0000 mg | ORAL_TABLET | ORAL | Status: DC
  Administered 2018-10-29: 50 mg via ORAL
  Filled 2018-10-28: qty 0.5

## 2018-10-28 MED ORDER — ACETAMINOPHEN 650 MG RE SUPP: 650.0000 mg | Freq: Four times a day (QID) | RECTAL | Status: DC | PRN

## 2018-10-28 MED ORDER — APIXABAN 2.5 MG PO TABS
2.5000 mg | ORAL_TABLET | Freq: Two times a day (BID) | ORAL | Status: DC
  Administered 2018-10-28 – 2018-10-29 (×2): 2.5 mg via ORAL
  Filled 2018-10-28 (×2): qty 1

## 2018-10-28 MED ORDER — ONDANSETRON HCL 4 MG/2ML IJ SOLN: 4.0000 mg | Freq: Four times a day (QID) | INTRAMUSCULAR | Status: DC | PRN

## 2018-10-28 NOTE — ED Provider Notes (Signed)
[facility] Emergency Department Provider Note  ____________________________________________   First MD Initiated Contact with Patient 10/28/18 1446     (approximate)  I have reviewed the triage vital signs and the nursing notes.   HISTORY  Chief Complaint Loss of Consciousness    HPI Abdulmalik Darco Yeung is a 48 y.o. male with extensive past medical history as below including recent end STEMI status post stenting here with syncope and chest pain.  The patient states that he was in his usual state of health and had been recovering well from recent PCI.  Over the last 24 hours, he has developed a recurrent aching, throbbing, chest pain, with associated shortness of breath and lightheadedness.  He states he feels incredibly lightheaded every time he tries to sit up, with associated dizziness.  He has passed out multiple times today.  In route, the patient reportedly passed out and had questionable shaking activity and was given Versed.  He states he feels very fatigued.  He also has a history of pseudoseizures.  Denies any headache.  He states the pain is more of a pressure, and does feel like his heart pain, though he also has chronic heart pain.  He also notes that the his cardiologist recently increased his blood pressure medications.  He is also had some moderate loose stools.  No known fevers or cough.        Past Medical History:  Diagnosis Date  . CAD (coronary artery disease)   . Chronic systolic heart failure ([place])   . Diabetes mellitus without complication ([place])   . Heart attack ([place])   . Hyperlipidemia   . Hypertension   . Ischemic cardiomyopathy   . Paroxysmal atrial fibrillation [facility])     Patient Active Problem List   Diagnosis Date Noted  . Chest pain 07/08/2018  . NSTEMI (non-ST elevated myocardial infarction) ([place]) 03/08/2018  . 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 [place]  scientific pacemaker    . CARDIAC SURGERY    . IRRIGATION AND DEBRIDEMENT KNEE Left 12/17/2017   Procedure: LEFT KNEE DEBRIDEMENT AND SYNOVECTOMY;  Surgeon: Thornton Park, MD;  Location: [facility] ORS;  Service: Orthopedics;  Laterality: Left;  . JOINT REPLACEMENT    . LEFT HEART CATH AND CORONARY ANGIOGRAPHY Left 03/09/2018   Procedure: LEFT HEART CATH AND CORONARY ANGIOGRAPHY;  Surgeon: Nelva Bush, MD;  Location: [place] [facility];  Service: Cardiovascular;  Laterality: Left;  . PERCUTANEOUS CORONARY STENT INTERVENTION (PCI-S)      Prior to Admission medications   Medication Sig Start Date End Date Taking? Authorizing Provider  acetaminophen (TYLENOL) 500 MG tablet TAKE 2 TABLETS (1 000MG ) BY MOUTH EVERY 8 HOURS AS NEEDED FOR PAIN 06/22/18 06/22/19  [provider]  albuterol (PROVENTIL HFA;VENTOLIN HFA) 108 (90 Base) MCG/ACT inhaler Inhale 2 puffs into the lungs every 6 (six) hours as needed for wheezing or shortness of breath. 04/29/18   Schuyler Amor, MD  Alirocumab 150 MG/ML SOAJ Inject 150 mg into the skin. Every 14 days 06/24/18   [provider]  allopurinol (ZYLOPRIM) 100 MG tablet Take 50 mg by mouth every other day. 03/18/18   [provider]  amiodarone (PACERONE) 200 MG tablet Take 200 mg by mouth daily. 09/14/17   [provider]  atorvastatin (LIPITOR) 80 MG tablet Take 80 mg by mouth every evening.  09/14/17   [provider]  BRILINTA 90 MG TABS  tablet Take 90 mg by mouth 2 (two) times daily. 09/14/17   [provider]  bumetanide (BUMEX) 2 MG tablet Take 2 mg by mouth daily. 09/14/17   [provider]  carvedilol (COREG) 25 MG tablet Take 25-50 mg by mouth See admin instructions. Take 1 tablet (25MG ) by mouth every morning and 2 tablets (50MG ) by mouth every night 10/16/17   [provider]  cholecalciferol (VITAMIN D) 1000 units tablet Take 5,000 Units by mouth daily.    [provider]   ezetimibe (ZETIA) 10 MG tablet Take 10 mg by mouth daily. 10/14/17   [provider]  FLUoxetine (PROZAC) 20 MG capsule Take 60 mg by mouth at bedtime.  12/04/17   [provider]  insulin aspart (NOVOLOG) 100 UNIT/ML injection Inject 15 Units into the skin 3 (three) times daily with meals. 12/20/17   Henreitta Leber, MD  insulin detemir (LEVEMIR) 100 UNIT/ML injection Inject 0.4 mLs (40 Units total) into the skin 2 (two) times daily. Patient taking differently: Inject 40 Units into the skin at bedtime.  12/20/17   Henreitta Leber, MD  isosorbide mononitrate (IMDUR) 30 MG 24 hr tablet Take 1 tablet (30 mg total) by mouth daily. 03/12/18   Epifanio Lesches, MD  Melatonin 3 MG CAPS Take 9 mg by mouth at bedtime.    [provider]  nitroGLYCERIN (NITROSTAT) 0.4 MG SL tablet Place 0.4 mg under the tongue every 5 (five) minutes as needed for chest pain.    [provider]  omeprazole (PRILOSEC) 20 MG capsule Take 20 mg by mouth daily. 05/28/18 05/28/19  [provider]  oxyCODONE-acetaminophen (PERCOCET) 5-325 MG tablet Take 1 tablet by mouth every 8 (eight) hours as needed. 06/09/18   Earleen Newport, MD  OZEMPIC, 0.25 OR 0.5 MG/DOSE, 2 MG/1.5ML SOPN Inject 0.5 mg into the skin every 7 (seven) days.  04/23/18   [provider]  QUEtiapine (SEROQUEL) 50 MG tablet Take 50 mg by mouth 2 (two) times daily as needed (headache).  03/18/18   [provider]  RENVELA 800 MG tablet Take 1,600 mg by mouth 3 (three) times daily with meals. 10/14/17   [provider]  traMADol (ULTRAM) 50 MG tablet Take 1 tablet (50 mg total) by mouth every 6 (six) hours as needed. 12/20/17   Henreitta Leber, MD  VOLTAREN 1 % GEL Apply 2 g topically 3 (three) times daily as needed (left leg pain).  12/04/17   [provider]  XARELTO 15 MG TABS tablet Take 15 mg by mouth at bedtime.  03/14/18   [provider]    Allergies Losartan and  Morphine and related  Family History  Problem Relation Age of Onset  . Diabetes Mother   . Peripheral vascular disease Mother   . COPD Father     Social History Social History   Tobacco Use  . 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 Smokeless tobacco: Current User    Types: Snuff  Substance Use Topics  . Alcohol use: Never    Frequency: Never  . Drug use: Never    Review of Systems  Review of Systems  Constitutional: Positive for fatigue. Negative for chills and fever.  HENT: Negative for sore throat.   Respiratory: Positive for chest tightness and shortness of breath.   Cardiovascular: Negative for chest pain.  Gastrointestinal: Positive for nausea. Negative for abdominal pain.  Genitourinary: Negative for flank pain.  Musculoskeletal: Negative for neck pain.  Skin:  Negative for rash and wound.  Allergic/Immunologic: Negative for immunocompromised state.  Neurological: Positive for syncope and weakness. Negative for numbness.  Hematological: Does not bruise/bleed easily.  All other systems reviewed and are negative.    ____________________________________________  PHYSICAL EXAM:      VITAL SIGNS: ED Triage Vitals  Enc Vitals Group     BP 10/28/18 1432 (!) 71/53     Pulse Rate 10/28/18 1432 84     Resp 10/28/18 1432 20     Temp 10/28/18 1443 98.1 F (36.7 C)     Temp Source 10/28/18 1443 Oral     SpO2 10/28/18 1443 96 %     Weight 10/28/18 1443 (!) 340 lb (154.2 kg)     Height --      Head Circumference --      Peak Flow --      Pain Score 10/28/18 1439 0     Pain Loc --      Pain Edu? --      Excl. in [place]? --      Physical Exam Vitals signs and nursing note reviewed.  Constitutional:      General: He is in acute distress.     Appearance: He is well-developed. He is diaphoretic.  HENT:     Head: Normocephalic and atraumatic.  Eyes:     Conjunctiva/sclera: Conjunctivae normal.  Neck:     Musculoskeletal: Neck supple.  Cardiovascular:     Rate and  Rhythm: Normal rate and regular rhythm.     Heart sounds: Normal heart sounds. No murmur. No friction rub.  Pulmonary:     Effort: Pulmonary effort is normal. No respiratory distress.     Breath sounds: Normal breath sounds. No wheezing or rales.  Abdominal:     General: There is no distension.     Palpations: Abdomen is soft.     Tenderness: There is no abdominal tenderness.  Skin:    Capillary Refill: Capillary refill takes less than 2 seconds.     Comments: Cool, diaphoretic  Neurological:     Mental Status: He is alert and oriented to person, place, and time.     Motor: No abnormal muscle tone.       ____________________________________________   LABS (all labs ordered are listed, but only abnormal results are displayed)  Labs Reviewed  SARS CORONAVIRUS 2 (HOSPITAL ORDER, [facility])  CBC WITH DIFFERENTIAL/PLATELET  COMPREHENSIVE METABOLIC PANEL  LACTIC ACID, PLASMA  LACTIC ACID, PLASMA  MAGNESIUM  TROPONIN I (HIGH SENSITIVITY)    ____________________________________________  EKG: Sinus rhythm, ventricular rate 83.  PR 214, QRS 109, QTc 474.  T wave inversions noted in lateral leads.  No acute ST elevations or depressions. ________________________________________  RADIOLOGY All imaging, including plain films, CT scans, and ultrasounds, independently reviewed by me, and interpretations confirmed via formal radiology reads.  ED MD interpretation:   Chest x-ray: Pending  Official radiology report(s): No results found.  ____________________________________________  PROCEDURES   Procedure(s) performed (including Critical Care):  Procedures  ____________________________________________  INITIAL IMPRESSION / MDM / [place] / ED COURSE  As part of my medical decision making, I reviewed the following data within the electronic MEDICAL RECORD NUMBER Notes from prior ED visits and [place] Controlled Substance Database      *Kashden Deboy Styles was evaluated in Emergency Department on 10/28/2018 for the symptoms described in the history of present illness. He was evaluated in the context of the [place] COVID-19  pandemic, which necessitated consideration that the patient might be at risk for infection with the SARS-CoV-2 virus that causes COVID-19. Institutional protocols and algorithms that pertain to the evaluation of patients at risk for COVID-19 are in a state of rapid change based on information released by regulatory bodies including the CDC and federal and state organizations. These policies and algorithms were followed during the patient's care in the ED.  Some ED evaluations and interventions [place] be delayed as a result of limited staffing during the pandemic.*      Medical Decision Making: 48 year old male with extensive history of coronary disease status post recent PCI due to N STEMI here with nausea and diaphoresis with syncope.  He is hypotensive on arrival.  Reportedly, he has been having diarrhea and recently had his blood pressure medication increased during his hospitalization.  EKG shows no ST elevations.  Differential is broad, with consideration of symptomatic hypotension and subsequent syncope secondary to increase in blood pressure medication, as well as possible mild dehydration from diarrhea.  Will give cautious fluids to increase his pressure at this time, though he does have a history of CHF as well.  He appears intravascularly dry clinically.  EKG is without signs of arrhythmia or ischemia, but obviously this must be a consideration in the setting of his recent PCI.  Will check screening labs.  Patient care transferred to Dr. Kerman Passey at the end of my shift. Patient presentation, ED course, and plan of care discussed with review of all pertinent labs and imaging. Please see his/her note for further details regarding further ED course and disposition.   ____________________________________________  FINAL  CLINICAL IMPRESSION(S) / ED DIAGNOSES  Final diagnoses:  Syncope, unspecified syncope type     MEDICATIONS GIVEN DURING THIS VISIT:  Medications  sodium chloride 0.9 % bolus 1,000 mL (1,000 mLs Intravenous New Bag/Given 10/28/18 1450)     ED Discharge Orders    None       Note:  This document was prepared using Dragon voice recognition software and [place] include unintentional dictation errors.   Duffy Bruce, MD 10/28/18 [phone]

## 2018-10-28 NOTE — ED Notes (Signed)
Pt assisted to bathroom

## 2018-10-28 NOTE — ED Triage Notes (Addendum)
Here for near syncope today. Pt got lightheaded and dizzy and almost passed out.  Hypotensive on arrival.  Pt denies pain. Does now mention diarrhea without abdominal pain. No fever. Cardiac cath one week ago with stent.  Does not appear well on arrival.  1 mg versed and 4 mg zofran with EMS

## 2018-10-28 NOTE — ED Notes (Signed)
Pt given phone to call wife.

## 2018-10-28 NOTE — H&P (Signed)
[facility] [place] NAME: Edwin [name]    MR#:  [id number]  DATE OF BIRTH:  01/13/1971  DATE OF ADMISSION:  10/28/2018  PRIMARY CARE PHYSICIAN: Caryl Never, MD   REQUESTING/REFERRING PHYSICIAN: Harvest Dark, MD  CHIEF COMPLAINT:   Chief Complaint  Patient presents with  . Loss of Consciousness    HISTORY OF PRESENT ILLNESS: Edwin [name]  is a 48 y.o. male with a known history of coronary artery disease, chronic systolic CHF, diabetes type 2, myocardial infarct, hyperlipidemia, hypertension, ischemic cardiomyopathy, paroxysmal atrial fibrillation who recently had a stent placed at [facility] last week.  She has had multiple stents in the past.  Also has chronic kidney disease.  Posts stent patient was noted to have worsening renal function he was told to hydrate himself.  Patient apparently also had a hypokalemia and was told to take some medication to decrease his potassium he states that over the past few days he has had diarrhea.  Today earlier he had episode where he lost consciousness.  When EMS arrived he was very hypotensive.  Patient received fluid and his blood pressure is now normal.  Patient is noted to have acute on chronic renal failure as well.  He denies any fevers chills he stated that when his blood pressure was low he did have a chest pain but no further chest pain.  PAST MEDICAL HISTORY:   Past Medical History:  Diagnosis Date  . CAD (coronary artery disease)   . Chronic systolic heart failure ([place])   . Diabetes mellitus without complication ([place])   . Heart attack ([place])   . Hyperlipidemia   . Hypertension   . Ischemic cardiomyopathy   . Paroxysmal atrial fibrillation ([place])     PAST SURGICAL HISTORY:  Past Surgical History:  Procedure Laterality Date  . [employer] pacemaker    . CARDIAC SURGERY    . IRRIGATION AND DEBRIDEMENT KNEE Left 12/17/2017   Procedure: LEFT KNEE DEBRIDEMENT AND SYNOVECTOMY;  Surgeon:  Thornton Park, MD;  Location: [facility] ORS;  Service: Orthopedics;  Laterality: Left;  . JOINT REPLACEMENT    . LEFT HEART CATH AND CORONARY ANGIOGRAPHY Left 03/09/2018   Procedure: LEFT HEART CATH AND CORONARY ANGIOGRAPHY;  Surgeon: Nelva Bush, MD;  Location: [facility];  Service: Cardiovascular;  Laterality: Left;  . PERCUTANEOUS CORONARY STENT INTERVENTION (PCI-S)      SOCIAL HISTORY:  Social History   Tobacco Use  . 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 Smokeless tobacco: Current User    Types: Snuff  Substance Use Topics  . Alcohol use: Never    Frequency: Never    FAMILY HISTORY:  Family History  Problem Relation Age of Onset  . Diabetes Mother   . Peripheral vascular disease Mother   . COPD Father     DRUG ALLERGIES:  Allergies  Allergen Reactions  . Losartan     Hyperkalemia (K>6)  . Morphine And Related Other (See Comments)    Overdose in hospital per patient report    REVIEW OF SYSTEMS:   CONSTITUTIONAL: No fever, positive fatigue or positive weakness.  EYES: No blurred or double vision.  EARS, NOSE, AND THROAT: No tinnitus or ear pain.  RESPIRATORY: No cough, shortness of breath, wheezing or hemoptysis.  CARDIOVASCULAR: No chest pain, orthopnea, edema.  GASTROINTESTINAL: Positive nausea, positive vomiting, positive diarrhea or no abdominal pain.  GENITOURINARY: No dysuria, hematuria.  ENDOCRINE: No polyuria, nocturia,  HEMATOLOGY: No anemia, easy bruising  or bleeding SKIN: No rash or lesion. MUSCULOSKELETAL: No joint pain or arthritis.   NEUROLOGIC: No tingling, numbness, weakness.  PSYCHIATRY: No anxiety or depression.   MEDICATIONS AT HOME:  Prior to Admission medications   Medication Sig Start Date End Date Taking? Authorizing Provider  acetaminophen (TYLENOL) 500 MG tablet TAKE 2 TABLETS (1 000MG ) BY MOUTH EVERY 8 HOURS AS NEEDED FOR PAIN 06/22/18 06/22/19  [provider]  albuterol (PROVENTIL HFA;VENTOLIN HFA) 108 (90 Base)  MCG/ACT inhaler Inhale 2 puffs into the lungs every 6 (six) hours as needed for wheezing or shortness of breath. 04/29/18   Schuyler Amor, MD  Alirocumab 150 MG/ML SOAJ Inject 150 mg into the skin. Every 14 days 06/24/18   [provider]  allopurinol (ZYLOPRIM) 100 MG tablet Take 50 mg by mouth every other day. 03/18/18   [provider]  amiodarone (PACERONE) 200 MG tablet Take 200 mg by mouth daily. 09/14/17   [provider]  atorvastatin (LIPITOR) 80 MG tablet Take 80 mg by mouth every evening.  09/14/17   [provider]  BRILINTA 90 MG TABS tablet Take 90 mg by mouth 2 (two) times daily. 09/14/17   [provider]  bumetanide (BUMEX) 2 MG tablet Take 2 mg by mouth daily. 09/14/17   [provider]  carvedilol (COREG) 25 MG tablet Take 25-50 mg by mouth See admin instructions. Take 1 tablet (25MG ) by mouth every morning and 2 tablets (50MG ) by mouth every night 10/16/17   [provider]  cholecalciferol (VITAMIN D) 1000 units tablet Take 5,000 Units by mouth daily.    [provider]  ezetimibe (ZETIA) 10 MG tablet Take 10 mg by mouth daily. 10/14/17   [provider]  FLUoxetine (PROZAC) 20 MG capsule Take 60 mg by mouth at bedtime.  12/04/17   [provider]  insulin aspart (NOVOLOG) 100 UNIT/ML injection Inject 15 Units into the skin 3 (three) times daily with meals. 12/20/17   Henreitta Leber, MD  insulin detemir (LEVEMIR) 100 UNIT/ML injection Inject 0.4 mLs (40 Units total) into the skin 2 (two) times daily. Patient taking differently: Inject 40 Units into the skin at bedtime.  12/20/17   Henreitta Leber, MD  isosorbide mononitrate (IMDUR) 30 MG 24 hr tablet Take 1 tablet (30 mg total) by mouth daily. 03/12/18   Epifanio Lesches, MD  Melatonin 3 MG CAPS Take 9 mg by mouth at bedtime.    [provider]  nitroGLYCERIN (NITROSTAT) 0.4 MG SL tablet Place 0.4 mg under the tongue every 5 (five)  minutes as needed for chest pain.    [provider]  omeprazole (PRILOSEC) 20 MG capsule Take 20 mg by mouth daily. 05/28/18 05/28/19  [provider]  oxyCODONE-acetaminophen (PERCOCET) 5-325 MG tablet Take 1 tablet by mouth every 8 (eight) hours as needed. 06/09/18   Earleen Newport, MD  OZEMPIC, 0.25 OR 0.5 MG/DOSE, 2 MG/1.5ML SOPN Inject 0.5 mg into the skin every 7 (seven) days.  04/23/18   [provider]  QUEtiapine (SEROQUEL) 50 MG tablet Take 50 mg by mouth 2 (two) times daily as needed (headache).  03/18/18   [provider]  RENVELA 800 MG tablet Take 1,600 mg by mouth 3 (three) times daily with meals. 10/14/17   [provider]  traMADol (ULTRAM) 50 MG tablet Take 1 tablet (50 mg total) by mouth every 6 (six) hours as needed. 12/20/17   Henreitta Leber, MD  VOLTAREN 1 %  GEL Apply 2 g topically 3 (three) times daily as needed (left leg pain).  12/04/17   [provider]  XARELTO 15 MG TABS tablet Take 15 mg by mouth at bedtime.  03/14/18   [provider]      PHYSICAL EXAMINATION:   VITAL SIGNS: Blood pressure 117/75, pulse 72, temperature 98.1 F (36.7 C), temperature source Oral, resp. rate 12, weight (!) 154.2 kg, SpO2 100 %.  GENERAL:  48 y.o.-year-old patient lying in the bed morbidly obese EYES: Pupils equal, round, reactive to light and accommodation. No scleral icterus. Extraocular muscles intact.  HEENT: Head atraumatic, normocephalic. Oropharynx and nasopharynx clear.  NECK:  Supple, no jugular venous distention. No thyroid enlargement, no tenderness.  LUNGS: Normal breath sounds bilaterally, no wheezing, rales,rhonchi or crepitation. No use of accessory muscles of respiration.  CARDIOVASCULAR: S1, S2 normal. No murmurs, rubs, or gallops.  ABDOMEN: Soft, nontender, nondistended. Bowel sounds present. No organomegaly or mass.  EXTREMITIES: No pedal edema, cyanosis, or clubbing.  NEUROLOGIC: Cranial nerves II  through XII are intact. Muscle strength 5/5 in all extremities. Sensation intact. Gait not checked.  PSYCHIATRIC: The patient is alert and oriented x 3.  SKIN: No obvious rash, lesion, or ulcer.   LABORATORY PANEL:   CBC Recent Labs  Lab 10/28/18 1445  WBC 7.5  HGB 10.6*  HCT 34.7*  PLT 330  MCV 83.4  MCH 25.5*  MCHC 30.5  RDW 17.2*  LYMPHSABS 1.5  MONOABS 0.9  EOSABS 0.4  BASOSABS 0.0   ------------------------------------------------------------------------------------------------------------------  Chemistries  Recent Labs  Lab 10/28/18 1445  NA 134*  K 5.5*  CL 108  CO2 17*  GLUCOSE 113*  BUN 54*  CREATININE 5.56*  CALCIUM 8.5*  MG 2.2  AST 11*  ALT 8  ALKPHOS 65  BILITOT 0.5   ------------------------------------------------------------------------------------------------------------------ estimated creatinine clearance is 25 mL/min (A) (by C-G formula based on SCr of 5.56 mg/dL (H)). ------------------------------------------------------------------------------------------------------------------ No results for input(s): TSH, T4TOTAL, T3FREE, THYROIDAB in the last 72 hours.  Invalid input(s): FREET3   Coagulation profile No results for input(s): INR, PROTIME in the last 168 hours. ------------------------------------------------------------------------------------------------------------------- No results for input(s): DDIMER in the last 72 hours. -------------------------------------------------------------------------------------------------------------------  Cardiac Enzymes No results for input(s): CKMB, TROPONINI, MYOGLOBIN in the last 168 hours.  Invalid input(s): CK ------------------------------------------------------------------------------------------------------------------ Invalid input(s): POCBNP  ---------------------------------------------------------------------------------------------------------------  Urinalysis     Component Value Date/Time   COLORURINE STRAW (A) 07/08/2018 1525   APPEARANCEUR CLEAR (A) 07/08/2018 1525   LABSPEC 1.010 07/08/2018 1525   PHURINE 5.0 07/08/2018 1525   GLUCOSEU 50 (A) 07/08/2018 1525   HGBUR SMALL (A) 07/08/2018 1525   BILIRUBINUR NEGATIVE 07/08/2018 1525   KETONESUR NEGATIVE 07/08/2018 1525   PROTEINUR 100 (A) 07/08/2018 1525   NITRITE NEGATIVE 07/08/2018 1525   LEUKOCYTESUR NEGATIVE 07/08/2018 1525     RADIOLOGY: Dg Chest Portable 1 View  Result Date: 10/28/2018 CLINICAL DATA:  Near syncope today. EXAM: PORTABLE CHEST 1 VIEW COMPARISON:  Single-view of the chest 05/22/2018 12/16/2017. FINDINGS: Pacing device is unchanged. Lung volumes are low but the lungs are clear. Heart size is normal. No pneumothorax or pleural fluid. No acute bony abnormality. IMPRESSION: No acute disease. Electronically Signed   By: Inge Rise M.D.   On: 10/28/2018 15:13    EKG: Orders placed or performed during the hospital encounter of 10/28/18  . EKG 12-Lead  . EKG 12-Lead  . EKG 12-Lead  . EKG 12-Lead  . ED EKG  . ED EKG  . ED  EKG  . ED EKG    IMPRESSION AND PLAN: Patient is 48 year old with multiple medical problems presenting with syncope  1.  Syncope in the setting of hypotension and dehydration monitor him on telemetry we will obtain echocardiogram of the heart  2.  Acute renal failure on chronic kidney disease stage III this has worsened due to prerenal loss with diarrhea we will give IV fluids follow renal function I have notified nephrology of the consult will obtain a renal ultrasound check a urinalysis  3.  Paroxysmal atrial fibrillation patient is currently on amiodarone which we will continue for now .  Hold Coreg.  Patient also was on Xarelto due to his renal failure I will change him to Eliquis.  4.  Hypotension likely due to diarrhea we will hold antihypertensive medications for now Give him IV fluids  5.  Diarrhea could be related to him receiving  medication for his hyperkalemia it is unclear what medication he is taking currently for that.  I will also check stool for C. difficile and stool for other organisms.  6.  Diabetes type 2 blood sugars are currently normal I will hold pre-meal insulin continue his long-acting insulin Place him on sliding scale insulin  7.  Mild hyperkalemia I will monitor that for now  8.  Chronic systolic CHF currently compensated due to hypotension acute renal failure hold Bumex monitor his respiratory status  9.  Miscellaneous Eliquis for DVT prophylaxis  10.  Tobacco abuse with snuff I strongly recommend he stop using that offered him a nicotine patch patient does not want nicotine patch for now 4 minutes spent recommended him to stop using any tobacco products  All the records are reviewed and case discussed with ED provider. Management plans discussed with the patient, family and they are in agreement.  CODE STATUS: Code Status History    Date Active Date Inactive Code Status Order ID Comments User Context   07/08/2018 2025 07/09/2018 1521 Full Code [id number]  Dustin Flock, MD Inpatient   03/08/2018 2246 03/11/2018 2257 Full Code [id number]  Gorden Harms, MD Inpatient   12/16/2017 2226 12/17/2017 1550 Full Code [id number]  Thornton Park, MD Inpatient   Advance Care Planning Activity       TOTAL TIME TAKING CARE OF THIS PATIENT:55 minutes.    Dustin Flock M.D on 10/28/2018 at 4:13 PM  Between 7am to 6pm - Pager - [phone]  After 6pm go to [email or web address] - password [email or web address]  [facility]  [phone]  CC: Primary care physician; Caryl Never, MD

## 2018-10-28 NOTE — ED Notes (Signed)
Patient assisted off of bedpan and cleaned. Clean pad placed on bed. Small amount of liquid brown stool noted.

## 2018-10-28 NOTE — ED Notes (Signed)
ED TO INPATIENT HANDOFF REPORT  ED Nurse Name and Phone #: Mateo Flow [phone]  S Name/Age/Gender Edwin [name] 48 y.o. male Room/Bed: ED04A/ED04A  Code Status   Code Status: Prior  Home/SNF/Other Home Patient oriented to: self, place, time and situation Is this baseline? Yes   Triage Complete: Triage complete  Chief Complaint dizziness  Triage Note Here for near syncope today. Pt got lightheaded and dizzy and almost passed out.  Hypotensive on arrival.  Pt denies pain. Does now mention diarrhea without abdominal pain. No fever. Cardiac cath one week ago with stent.  Does not appear well on arrival.  1 mg versed and 4 mg zofran with EMS   Allergies Allergies  Allergen Reactions  . Losartan     Hyperkalemia (K>6)  . Morphine And Related Other (See Comments)    Overdose in hospital per patient report    Level of Care/Admitting Diagnosis ED Disposition    ED Disposition Condition [place]: [place] [100120]  Level of Care: Telemetry [5]  Covid Evaluation: Asymptomatic Screening Protocol (No Symptoms)  Diagnosis: Syncope and collapse [780.2.ICD-9-CM]  Admitting Physician: Dustin Flock [[id number]]  Attending Physician: Dustin Flock [[id number]]  Estimated length of stay: past midnight tomorrow  Certification:: I certify this patient will need inpatient services for at least 2 midnights  PT Class (Do Not Modify): Inpatient [101]  PT Acc Code (Do Not Modify): Private [1]       B Medical/Surgery History Past Medical History:  Diagnosis Date  . CAD (coronary artery disease)   . Chronic systolic heart failure ([place])   . Diabetes mellitus without complication ([place])   . Heart attack ([place])   . Hyperlipidemia   . Hypertension   . Ischemic cardiomyopathy   . Paroxysmal atrial fibrillation [facility])    Past Surgical History:  Procedure Laterality Date  . [employer] pacemaker    . CARDIAC SURGERY    . IRRIGATION AND  DEBRIDEMENT KNEE Left 12/17/2017   Procedure: LEFT KNEE DEBRIDEMENT AND SYNOVECTOMY;  Surgeon: Thornton Park, MD;  Location: [facility] ORS;  Service: Orthopedics;  Laterality: Left;  . JOINT REPLACEMENT    . LEFT HEART CATH AND CORONARY ANGIOGRAPHY Left 03/09/2018   Procedure: LEFT HEART CATH AND CORONARY ANGIOGRAPHY;  Surgeon: Nelva Bush, MD;  Location: [place] [facility] LAB;  Service: Cardiovascular;  Laterality: Left;  . PERCUTANEOUS CORONARY STENT INTERVENTION (PCI-S)       A IV Location/Drains/Wounds Patient Lines/Drains/Airways Status   Active Line/Drains/Airways    Name:   Placement date:   Placement time:   Site:   Days:   Peripheral IV 10/28/18 Right Antecubital   10/28/18    -    Antecubital   less than 1   Peripheral IV 10/28/18 Left Hand   10/28/18    1440    Hand   less than 1          Intake/Output Last 24 hours  Intake/Output Summary (Last 24 hours) at 10/28/2018 1648 Last data filed at 10/28/2018 1555 Gross per 24 hour  Intake 2000 ml  Output -  Net 2000 ml    Labs/Imaging Results for orders placed or performed during the hospital encounter of 10/28/18 (from the past 48 hour(s))  CBC with Differential     Status: Abnormal   Collection Time: 10/28/18  2:45 PM  Result Value Ref Range   WBC 7.5 4.0 - 10.5 K/uL   RBC 4.16 (L) 4.22 - 5.81 MIL/uL  Hemoglobin 10.6 (L) 13.0 - 17.0 g/dL   HCT 34.7 (L) 39.0 - 52.0 %   MCV 83.4 80.0 - 100.0 fL   MCH 25.5 (L) 26.0 - 34.0 pg   MCHC 30.5 30.0 - 36.0 g/dL   RDW 17.2 (H) 11.5 - 15.5 %   Platelets 330 150 - 400 K/uL   nRBC 0.0 0.0 - 0.2 %   Neutrophils Relative % 60 %   Neutro Abs 4.6 1.7 - 7.7 K/uL   Lymphocytes Relative 20 %   Lymphs Abs 1.5 0.7 - 4.0 K/uL   Monocytes Relative 12 %   Monocytes Absolute 0.9 0.1 - 1.0 K/uL   Eosinophils Relative 6 %   Eosinophils Absolute 0.4 0.0 - 0.5 K/uL   Basophils Relative 1 %   Basophils Absolute 0.0 0.0 - 0.1 K/uL   Immature Granulocytes 1 %   Abs Immature Granulocytes  0.04 0.00 - 0.07 K/uL    Comment: Performed at [facility], [address] [place], [place] [address]  Comprehensive metabolic panel     Status: Abnormal   Collection Time: 10/28/18  2:45 PM  Result Value Ref Range   Sodium 134 (L) 135 - 145 mmol/L   Potassium 5.5 (H) 3.5 - 5.1 mmol/L   Chloride 108 98 - 111 mmol/L   CO2 17 (L) 22 - 32 mmol/L   Glucose, Bld 113 (H) 70 - 99 mg/dL   BUN 54 (H) 6 - 20 mg/dL   Creatinine, Ser 5.56 (H) 0.61 - 1.24 mg/dL   Calcium 8.5 (L) 8.9 - 10.3 mg/dL   Total Protein 7.4 6.5 - 8.1 g/dL   Albumin 3.1 (L) 3.5 - 5.0 g/dL   AST 11 (L) 15 - 41 U/L   ALT 8 0 - 44 U/L   Alkaline Phosphatase 65 38 - 126 U/L   Total Bilirubin 0.5 0.3 - 1.2 mg/dL   GFR calc non Af Amer 11 (L) >60 mL/min   GFR calc Af Amer 13 (L) >60 mL/min   Anion gap 9 5 - 15    Comment: Performed at [facility], [place], [place] [address]  Troponin I (High Sensitivity)     Status: None   Collection Time: 10/28/18  2:45 PM  Result Value Ref Range   Troponin I (High Sensitivity) 11 <18 ng/L    Comment: (NOTE) Elevated high sensitivity troponin I (hsTnI) values and significant  changes across serial measurements [place] suggest ACS but many other  chronic and acute conditions are known to elevate hsTnI results.  Refer to the "Links" section for chest pain algorithms and additional  guidance. Performed at [facility], [address] [place], [place] [address]   Lactic acid, plasma     Status: None   Collection Time: 10/28/18  2:45 PM  Result Value Ref Range   Lactic Acid, Venous 1.5 0.5 - 1.9 mmol/L    Comment: Performed at [facility], [address] [place], [place] [address]  Magnesium     Status: None   Collection Time: 10/28/18  2:45 PM  Result Value Ref Range   Magnesium 2.2 1.7 - 2.4 mg/dL    Comment: Performed at [facility], [address] [place], [place] [address]  SARS Coronavirus 2 (CEPHEID - Performed  in [place] hospital lab), Hosp Order     Status: None   Collection Time: 10/28/18  3:03 PM   Specimen: Nasopharyngeal Swab  Result Value Ref Range  SARS Coronavirus 2 NEGATIVE NEGATIVE    Comment: (NOTE) If result is NEGATIVE SARS-CoV-2 target nucleic acids are NOT DETECTED. The SARS-CoV-2 RNA is generally detectable in upper and lower  respiratory specimens during the acute phase of infection. The lowest  concentration of SARS-CoV-2 viral copies this assay can detect is 250  copies / mL. A negative result does not preclude SARS-CoV-2 infection  and should not be used as the sole basis for treatment or other  patient management decisions.  A negative result Ke occur with  improper specimen collection / handling, submission of specimen other  than nasopharyngeal swab, presence of viral mutation(s) within the  areas targeted by this assay, and inadequate number of viral copies  (<250 copies / mL). A negative result must be combined with clinical  observations, patient history, and epidemiological information. If result is POSITIVE SARS-CoV-2 target nucleic acids are DETECTED. The SARS-CoV-2 RNA is generally detectable in upper and lower  respiratory specimens dur ing the acute phase of infection.  Positive  results are indicative of active infection with SARS-CoV-2.  Clinical  correlation with patient history and other diagnostic information is  necessary to determine patient infection status.  Positive results do  not rule out bacterial infection or co-infection with other viruses. If result is PRESUMPTIVE POSTIVE SARS-CoV-2 nucleic acids [place]do BE PRESENT.   A presumptive positive result was obtained on the submitted specimen  and confirmed on repeat testing.  While 2019 novel coronavirus  (SARS-CoV-2) nucleic acids [place] be present in the submitted sample  additional confirmatory testing [place] be necessary for epidemiological  and / or clinical management purposes  to differentiate  between  SARS-CoV-2 and other Sarbecovirus currently known to infect humans.  If clinically indicated additional testing with an alternate test  methodology [phone]) is advised. The SARS-CoV-2 RNA is generally  detectable in upper and lower respiratory sp ecimens during the acute  phase of infection. The expected result is Negative. Fact Sheet for Patients:  StrictlyIdeas.no Fact Sheet for Healthcare Providers: BankingDealers.co.za This test is not yet approved or cleared by the [place] FDA and has been authorized for detection and/or diagnosis of SARS-CoV-2 by FDA under an Emergency Use Authorization (EUA).  This EUA will remain in effect (meaning this test can be used) for the duration of the COVID-19 declaration under Section 564(b)(1) of the Act, 21 U.S.C. section 360bbb-3(b)(1), unless the authorization is terminated or revoked sooner. Performed at [facility], [address], [place], [place] [address]    Dg Chest Portable 1 View  Result Date: 10/28/2018 CLINICAL DATA:  Near syncope today. EXAM: PORTABLE CHEST 1 VIEW COMPARISON:  Single-view of the chest 05/22/2018 12/16/2017. FINDINGS: Pacing device is unchanged. Lung volumes are low but the lungs are clear. Heart size is normal. No pneumothorax or pleural fluid. No acute bony abnormality. IMPRESSION: No acute disease. Electronically Signed   By: Inge Rise M.D.   On: 10/28/2018 15:13    Pending Labs Unresulted Labs (From admission, onward)    Start     Ordered   10/28/18 1642  Urinalysis, Complete w Microscopic  Once,   STAT     10/28/18 1641   10/28/18 1635  C difficile quick scan w PCR reflex  (C Difficile quick screen w PCR reflex panel)  Once, for 24 hours,   STAT     10/28/18 1634   10/28/18 1635  Gastrointestinal Panel by PCR , Stool  (Gastrointestinal Panel by PCR, Stool)  Once,   STAT  10/28/18 1634   Signed and Held  TSH  Once,   R      Signed and Held   Signed and Held  CBC  Tomorrow morning,   R     Signed and Held   Signed and Held  Basic metabolic panel  Tomorrow morning,   R     Signed and Held          Vitals/Pain Today's Vitals   10/28/18 1502 10/28/18 1530 10/28/18 1600 10/28/18 1630  BP: 106/70 123/66 117/75 123/74  Pulse: 71 76 72 71  Resp: 16 20 12 14   Temp:      TempSrc:      SpO2: 98% 98% 100% 98%  Weight:      PainSc:        Isolation Precautions Enteric precautions (UV disinfection)  Medications Medications  apixaban (ELIQUIS) tablet 2.5 mg (has no administration in time range)  sodium chloride 0.9 % bolus 1,000 mL (0 mLs Intravenous Stopped 10/28/18 1555)  0.9 %  sodium chloride infusion ( Intravenous New Bag/Given 10/28/18 1617)    Mobility walks Low fall risk   Focused Assessments Cardiac Assessment Handoff:  Cardiac Rhythm: Normal sinus rhythm Lab Results  Component Value Date   CKTOTAL 44 (L) 05/10/2018   TROPONINI 0.09 ([place]) 07/09/2018   No results found for: DDIMER Does the Patient currently have chest pain? No  , Neuro Assessment Handoff:  Swallow screen pass? NA Cardiac Rhythm: Normal sinus rhythm       Neuro Assessment: Exceptions to WDL(had near syncope today. did have episode of head shaking with EMS; hx psuedoseizure.  ) Neuro Checks:      Last Documented NIHSS Modified Score:   Has TPA been given? No If patient is a Neuro Trauma and patient is going to OR before floor call report to [place] nurse: [phone] or [phone]     R Recommendations: See Admitting Provider Note  Report given to:   Additional Notes:

## 2018-10-28 NOTE — ED Provider Notes (Signed)
-----------------------------------------   4:05 PM on 10/28/2018 -----------------------------------------  Patient care assumed from Dr. Ellender Hose.  Patient appearing much better, blood pressure has improved currently 552 systolic during my evaluation.  Patient's creatinine has worsened from a baseline of around 3.5 to now 5.5 consistent with renal insufficiency possibly contrast-induced nephropathy due to his recent PCI at [facility] 5 days ago.  Patient did have a syncopal versus near syncopal episode today along with chest pressure/tightness.  Denies any chest discomfort at this time.  Patient's K is elevated from a baseline around 5 to now 5.5.  Patient has received a liter of IV fluids we will continue to infuse at 150 cc/h, history of CHF.  Patient is feeling much better.  I offered to transfer the patient back to [facility] as his nephrologist as well as cardiology team is at [facility] with recent PCI at [facility].  Patient absolutely refusing transfer at this time states he would rather stay here if he has to stay anywhere.  We will admit the patient to the hospital service for further treatment.  Patient agreeable to plan of care.  COVID test pending.  Reassuringly chest x-ray is negative, troponin negative.   Harvest Dark, MD 10/28/18 [phone]

## 2018-10-28 NOTE — ED Notes (Signed)
Pt reports needs to have bowel movement. Offered bedpan r/t bp and pt status but pt declining.

## 2018-10-28 NOTE — ED Notes (Signed)
Pt transported to room 232 by this RN

## 2018-10-28 NOTE — Unmapped (Signed)
Pt: @ [facility]    Spoke to pt's wife Jeremy Oliver.  States around 2pm today pt was unable to stand without assistance d/t  Hypotension and elevated Cr.  Wife fears pt will need to start dialysis asap.  States pt has had diarrhea 2-3 days.  Asks I report pt's ED admission to PCP and Cardiology.    Jeremy Oliver states once pt is stabilized he'll  be transferred to [facility].     Pt did not have c/o CP this time.Marland Kitchen

## 2018-10-28 NOTE — Unmapped (Signed)
[name] called to say [name] has been rushed to [facility].  She says his BP dropped.  She wanted you to know.  Her # is [phone]

## 2018-10-29 ENCOUNTER — Inpatient Hospital Stay: Payer: Medicare Other

## 2018-10-29 ENCOUNTER — Inpatient Hospital Stay: Admit: 2018-10-29 | Payer: Medicare Other

## 2018-10-29 ENCOUNTER — Inpatient Hospital Stay (HOSPITAL_COMMUNITY)
Admit: 2018-10-29 | Discharge: 2018-10-29 | Disposition: A | Discharge status: Home / Self Care | Payer: Medicare Other | Attending: Internal Medicine | Admitting: Internal Medicine

## 2018-10-29 DIAGNOSIS — R55 Syncope and collapse: Secondary | ICD-10-CM

## 2018-10-29 DIAGNOSIS — I340 Nonrheumatic mitral (valve) insufficiency: Secondary | ICD-10-CM

## 2018-10-29 LAB — CBC
HCT: 33.9 % — ABNORMAL LOW (ref 39.0–52.0)
Hemoglobin: 10.2 g/dL — ABNORMAL LOW (ref 13.0–17.0)
MCH: 25.1 pg — ABNORMAL LOW (ref 26.0–34.0)
MCHC: 30.1 g/dL (ref 30.0–36.0)
MCV: 83.3 fL (ref 80.0–100.0)
Platelets: 291 10*3/uL (ref 150–400)
RBC: 4.07 MIL/uL — ABNORMAL LOW (ref 4.22–5.81)
RDW: 17.4 % — ABNORMAL HIGH (ref 11.5–15.5)
WBC: 10.6 10*3/uL — ABNORMAL HIGH (ref 4.0–10.5)
nRBC: 0 % (ref 0.0–0.2)

## 2018-10-29 LAB — BASIC METABOLIC PANEL
Anion gap: 10 (ref 5–15)
BUN: 54 mg/dL — ABNORMAL HIGH (ref 6–20)
CO2: 15 mmol/L — ABNORMAL LOW (ref 22–32)
Calcium: 8.3 mg/dL — ABNORMAL LOW (ref 8.9–10.3)
Chloride: 112 mmol/L — ABNORMAL HIGH (ref 98–111)
Creatinine, Ser: 5.42 mg/dL — ABNORMAL HIGH (ref 0.61–1.24)
GFR calc Af Amer: 13 mL/min — ABNORMAL LOW (ref >60)
GFR calc non Af Amer: 11 mL/min — ABNORMAL LOW (ref >60)
Glucose, Bld: 98 mg/dL (ref 70–99)
Potassium: 5.1 mmol/L (ref 3.5–5.1)
Sodium: 137 mmol/L (ref 135–145)

## 2018-10-29 LAB — ECHOCARDIOGRAM COMPLETE
Height: 75 in
Weight: 5315.2 oz

## 2018-10-29 LAB — GLUCOSE, CAPILLARY: Glucose-Capillary: 164 mg/dL — ABNORMAL HIGH (ref 70–99)

## 2018-10-29 MED ORDER — APIXABAN 5 MG PO TABS
5.0000 mg | ORAL_TABLET | Freq: Two times a day (BID) | ORAL | Status: DC
  Administered 2018-10-29 – 2018-10-30 (×2): 5 mg via ORAL
  Filled 2018-10-29 (×2): qty 1

## 2018-10-29 MED ORDER — SODIUM BICARBONATE 8.4 % IV SOLN
INTRAVENOUS | Status: DC
  Administered 2018-10-29 – 2018-10-30 (×2): via INTRAVENOUS
  Filled 2018-10-29 (×7): qty 150

## 2018-10-29 MED ORDER — ENSURE MAX PROTEIN PO LIQD
11.0000 [oz_av] | Freq: Two times a day (BID) | ORAL | Status: DC
  Administered 2018-10-29 – 2018-10-30 (×2): 11 [oz_av] via ORAL
  Filled 2018-10-29: qty 330

## 2018-10-29 MED ORDER — ADULT MULTIVITAMIN W/MINERALS CH
1.0000 | ORAL_TABLET | Freq: Every day | ORAL | Status: DC
  Administered 2018-10-30: 1 via ORAL
  Filled 2018-10-29: qty 1

## 2018-10-29 MED ORDER — PERFLUTREN LIPID MICROSPHERE
1.0000 mL | INTRAVENOUS | Status: AC | PRN
  Administered 2018-10-29: 11:00:00 3 mL via INTRAVENOUS
  Filled 2018-10-29: qty 10

## 2018-10-29 MED ORDER — LOPERAMIDE HCL 2 MG PO CAPS
4.0000 mg | ORAL_CAPSULE | ORAL | Status: DC | PRN
  Administered 2018-10-29: 4 mg via ORAL
  Filled 2018-10-29: qty 2

## 2018-10-29 NOTE — Progress Notes (Signed)
Please watch pts weight

## 2018-10-29 NOTE — Progress Notes (Addendum)
Initial Nutrition Assessment  DOCUMENTATION CODES:   Morbid obesity  INTERVENTION:   Ensure Max protein supplement BID, each supplement provides 150kcal and 30g of protein.  MVI daily   NUTRITION DIAGNOSIS:   Increased nutrient needs related to other (see comment)(morbid obesity) as evidenced by increased estimated needs.  GOAL:   Patient will meet greater than or equal to 90% of their needs  MONITOR:   PO intake, Supplement acceptance, Labs, Weight trends, Skin, I & O's  REASON FOR ASSESSMENT:   Malnutrition Screening Tool    ASSESSMENT:   48 y.o. male with a known history of coronary artery disease, chronic systolic CHF, diabetes type 2, myocardial infarct, hyperlipidemia, hypertension, ischemic cardiomyopathy, paroxysmal atrial fibrillation who recently had a stent placed at [facility] last week admitted with syncope  RD working remotely.  Pt reports good appetite and oral intake at baseline but reports his appetite has been decreased for 3 days pta r/t diarrhea. Pt reports his appetite has improved today; pt reports eating 100% of his breakfast this morning which included pancakes, chocolate milk and fruit. Pt with increased estimated needs r/t morbid obesity. RD will order supplements to help pt meet his estimated needs; pt prefers vanilla flavors. Per chart, pt appears weight stable pta.   Medications reviewed and include: allopurinol, vitamin D, insulin, protonix, renvela, brilanta, NaCl @100ml /hr   Labs reviewed: BUN 54(H), creat 5.42(H) wbc- 10.6(H), Hgb 10.2(L), Hct 33.9(L)  Unable to complete Nutrition-Focused physical exam at this time as pt on contact precautions.   Diet Order:   Diet Order            Diet heart healthy/carb modified Room service appropriate? Yes; Fluid consistency: Thin  Diet effective now             EDUCATION NEEDS:   No education needs have been identified at this time  Skin:  Skin Assessment: Reviewed RN Assessment  Last BM:   7/31- type 7  Height:   Ht Readings from Last 1 Encounters:  10/28/18 6\' 3"  (1.905 m)    Weight:   Wt Readings from Last 1 Encounters:  10/28/18 (!) 150.7 kg    Ideal Body Weight:  89 kg  BMI:  Body mass index is 41.52 kg/m.  Estimated Nutritional Needs:   Kcal:  2700-3000kcal/day  Protein:  >135g/day  Fluid:  >2.6L/day  Koleen Distance MS, RD, LDN Pager #- [phone] Office#- [phone] After Hours Pager: [phone]

## 2018-10-29 NOTE — Plan of Care (Signed)

## 2018-10-29 NOTE — Progress Notes (Signed)
*  PRELIMINARY RESULTS* Echocardiogram 2D Echocardiogram has been performed.  Edwin [name] 10/29/2018, 11:29 AM

## 2018-10-29 NOTE — Progress Notes (Signed)
[facility]  ROUNDING NOTE   Subjective:   Patient known to [place] from previous admissions. Followed by [facility].   [name] admitted to [facility] on 10/28/2018 for Renal insufficiency [N28.9] Acute renal failure (ARF) ([name]) [N17.9] Syncope, unspecified syncope type [R55]  Recent admission to [facility] where patient underwent cardiac catheterization and stent placement.   Started on SPS for hyperkalemia. This caused patient to have severe diarrhea. Which led patient to syncopal episode.   Objective:  Vital signs in last 24 hours:  Temp:  [97.5 F (36.4 C)-98.1 F (36.7 C)] 97.7 F (36.5 C) (07/31 0906) Pulse Rate:  [71-84] 79 (07/31 0906) Resp:  [12-20] 19 (07/31 0906) BP: (71-125)/(53-92) 125/64 (07/31 0906) SpO2:  [96 %-100 %] 99 % (07/31 0906) Weight:  [150.7 kg-154.2 kg] 150.7 kg (07/30 1737)  Weight change:  Filed Weights   10/28/18 1443 10/28/18 1737  Weight: (!) 154.2 kg (!) 150.7 kg    Intake/Output: I/O last 3 completed shifts: In: 4008.5 [P.O.:700; I.V.:2308.5; IV Piggyback:1000] Out: 200 [Urine:200]   Intake/Output this shift:  Total I/O In: -  Out: 800 [Urine:800]  Physical Exam: General: NAD, laying in bed  Head: Normocephalic, atraumatic. Moist oral mucosal membranes  Eyes: Anicteric, PERRL  Neck: Supple, trachea midline  Lungs:  Clear to auscultation  Heart: Regular rate and rhythm  Abdomen:  Soft, nontender,   Extremities: No peripheral edema.  Neurologic: Nonfocal, moving all four extremities  Skin: No lesions  Access: none    Basic Metabolic Panel: Recent Labs  Lab 10/28/18 1445 10/29/18 0523  NA 134* 137  K 5.5* 5.1  CL 108 112*  CO2 17* 15*  GLUCOSE 113* 98  BUN 54* 54*  CREATININE 5.56* 5.42*  CALCIUM 8.5* 8.3*  MG 2.2  --     Liver Function Tests: Recent Labs  Lab 10/28/18 1445  AST 11*  ALT 8  ALKPHOS 65  BILITOT 0.5  PROT 7.4  ALBUMIN 3.1*   No results for input(s): LIPASE, AMYLASE in the last  168 hours. No results for input(s): AMMONIA in the last 168 hours.  CBC: Recent Labs  Lab 10/28/18 1445 10/29/18 0523  WBC 7.5 10.6*  NEUTROABS 4.6  --   HGB 10.6* 10.2*  HCT 34.7* 33.9*  MCV 83.4 83.3  PLT 330 291    Cardiac Enzymes: No results for input(s): CKTOTAL, CKMB, CKMBINDEX, TROPONINI in the last 168 hours.  BNP: Invalid input(s): POCBNP  CBG: Recent Labs  Lab 10/28/18 2109  GLUCAP 141*    Microbiology: Results for orders placed or performed during the hospital encounter of 10/28/18  SARS Coronavirus 2 (CEPHEID - Performed in [facility] hospital lab), Hosp Order     Status: None   Collection Time: 10/28/18  3:03 PM   Specimen: Nasopharyngeal Swab  Result Value Ref Range Status   SARS Coronavirus 2 NEGATIVE NEGATIVE Final    Comment: (NOTE) If result is NEGATIVE SARS-CoV-2 target nucleic acids are NOT DETECTED. The SARS-CoV-2 RNA is generally detectable in upper and lower  respiratory specimens during the acute phase of infection. The lowest  concentration of SARS-CoV-2 viral copies this assay can detect is 250  copies / mL. A negative result does not preclude SARS-CoV-2 infection  and should not be used as the sole basis for treatment or other  patient management decisions.  A negative result [place] occur with  improper specimen collection / handling, submission of specimen other  than nasopharyngeal swab, presence of viral mutation(s)  within the  areas targeted by this assay, and inadequate number of viral copies  (<250 copies / mL). A negative result must be combined with clinical  observations, patient history, and epidemiological information. If result is POSITIVE SARS-CoV-2 target nucleic acids are DETECTED. The SARS-CoV-2 RNA is generally detectable in upper and lower  respiratory specimens dur ing the acute phase of infection.  Positive  results are indicative of active infection with SARS-CoV-2.  Clinical  correlation with patient history and  other diagnostic information is  necessary to determine patient infection status.  Positive results do  not rule out bacterial infection or co-infection with other viruses. If result is PRESUMPTIVE POSTIVE SARS-CoV-2 nucleic acids [place] BE PRESENT.   A presumptive positive result was obtained on the submitted specimen  and confirmed on repeat testing.  While 2019 novel coronavirus  (SARS-CoV-2) nucleic acids [place] be present in the submitted sample  additional confirmatory testing [place]k be necessary for epidemiological  and / or clinical management purposes  to differentiate between  SARS-CoV-2 and other Sarbecovirus currently known to infect humans.  If clinically indicated additional testing with an alternate test  methodology [phone]) is advised. The SARS-CoV-2 RNA is generally  detectable in upper and lower respiratory sp ecimens during the acute  phase of infection. The expected result is Negative. Fact Sheet for Patients:  StrictlyIdeas.no Fact Sheet for Healthcare Providers: BankingDealers.co.za This test is not yet approved or cleared by the [place] FDA and has been authorized for detection and/or diagnosis of SARS-CoV-2 by FDA under an Emergency Use Authorization (EUA).  This EUA will remain in effect (meaning this test can be used) for the duration of the COVID-19 declaration under Section 564(b)(1) of the Act, 21 U.S.C. section 360bbb-3(b)(1), unless the authorization is terminated or revoked sooner. Performed at [facility], [address] [place], [place] [address]   C difficile quick scan w PCR reflex     Status: None   Collection Time: 10/28/18  4:35 PM   Specimen: STOOL  Result Value Ref Range Status   C Diff antigen NEGATIVE NEGATIVE Final   C Diff toxin NEGATIVE NEGATIVE Final   C Diff interpretation No C. difficile detected.  Final    Comment: Performed at [facility], [address]  [place], [place] [address]  Gastrointestinal Panel by PCR , Stool     Status: None   Collection Time: 10/28/18  4:35 PM   Specimen: STOOL  Result Value Ref Range Status   Campylobacter species NOT DETECTED NOT DETECTED Final   Plesimonas shigelloides NOT DETECTED NOT DETECTED Final   Salmonella species NOT DETECTED NOT DETECTED Final   Yersinia enterocolitica NOT DETECTED NOT DETECTED Final   Vibrio species NOT DETECTED NOT DETECTED Final   Vibrio cholerae NOT DETECTED NOT DETECTED Final   Enteroaggregative E coli (EAEC) NOT DETECTED NOT DETECTED Final   Enteropathogenic E coli (EPEC) NOT DETECTED NOT DETECTED Final   Enterotoxigenic E coli (ETEC) NOT DETECTED NOT DETECTED Final   Shiga like toxin producing E coli (STEC) NOT DETECTED NOT DETECTED Final   Shigella/Enteroinvasive E coli (EIEC) NOT DETECTED NOT DETECTED Final   Cryptosporidium NOT DETECTED NOT DETECTED Final   Cyclospora cayetanensis NOT DETECTED NOT DETECTED Final   Entamoeba histolytica NOT DETECTED NOT DETECTED Final   Giardia lamblia NOT DETECTED NOT DETECTED Final   Adenovirus F40/41 NOT DETECTED NOT DETECTED Final   Astrovirus NOT DETECTED NOT DETECTED Final   Norovirus GI/GII NOT DETECTED NOT DETECTED Final  Rotavirus A NOT DETECTED NOT DETECTED Final   Sapovirus (I, II, IV, and V) NOT DETECTED NOT DETECTED Final    Comment: Performed at [facility], [address] [place], [place] [address]    Coagulation Studies: No results for input(s): LABPROT, INR in the last 72 hours.  Urinalysis: Recent Labs    10/28/18 2145  COLORURINE STRAW*  LABSPEC 1.008  PHURINE 5.0  GLUCOSEU 50*  HGBUR SMALL*  BILIRUBINUR NEGATIVE  KETONESUR NEGATIVE  PROTEINUR 100*  NITRITE NEGATIVE  LEUKOCYTESUR NEGATIVE      Imaging: US Renal  Result Date: 10/29/2018 CLINICAL DATA:  ARF EXAM: RENAL / URINARY TRACT ULTRASOUND COMPLETE COMPARISON:  CT 07/08/2018. FINDINGS: Right Kidney: Renal measurements: 15.0 x 6.0 x 5.4  cm = volume: 257 mL . Echogenicity within normal limits. No mass or hydronephrosis visualized. Left Kidney: Renal measurements: 13.3 x 5.5 x 6.0 cm = volume: 230 mL. Echogenicity within normal limits. No mass or hydronephrosis visualized. Bladder: Appears normal for degree of bladder distention. IMPRESSION: No acute or focal abnormality identified. Electronically Signed   By: Marcello Moores  Register   On: 10/29/2018 09:17   Dg Chest Portable 1 View  Result Date: 10/28/2018 CLINICAL DATA:  Near syncope today. EXAM: PORTABLE CHEST 1 VIEW COMPARISON:  Single-view of the chest 05/22/2018 12/16/2017. FINDINGS: Pacing device is unchanged. Lung volumes are low but the lungs are clear. Heart size is normal. No pneumothorax or pleural fluid. No acute bony abnormality. IMPRESSION: No acute disease. Electronically Signed   By: Inge Rise M.D.   On: 10/28/2018 15:13     Medications:   . sodium chloride 100 mL/hr at 10/29/18 0854   . allopurinol  50 mg Oral QODAY  . amiodarone  200 mg Oral Daily  . apixaban  2.5 mg Oral BID  . atorvastatin  80 mg Oral QPM  . cholecalciferol  5,000 Units Oral Daily  . ezetimibe  10 mg Oral Daily  . FLUoxetine  60 mg Oral QHS  . gabapentin  300 mg Oral BID  . insulin detemir  40 Units Subcutaneous QHS  . [START ON 10/30/2018] multivitamin with minerals  1 tablet Oral Daily  . pantoprazole  40 mg Oral Daily  . Ensure Max Protein  11 oz Oral BID  . sevelamer carbonate  1,600 mg Oral TID WC  . sodium chloride flush  3 mL Intravenous Q12H  . ticagrelor  90 mg Oral BID   acetaminophen **OR** acetaminophen, albuterol, loperamide, ondansetron **OR** ondansetron (ZOFRAN) IV, QUEtiapine  Assessment/ Plan:  [name] is a 48 y.o. white male with hypertension, coronary artery disease, atrial fibrillation, hyperlipidemia, diabetes mellitus type II, systolic congestive heart failure, who is admitted to [facility] on 10/28/2018 for Renal insufficiency [N28.9] Acute renal failure  (ARF) ([place]) [N17.9] Syncope, unspecified syncope type [R55]  1. Acute renal failure on chronic kidney disease stage IV with metabolic acidosis, hyperkalemia and proteinuria Baseline creatinine of 3.86, GFR of 17 on 7/25.  Chronic kidney disease secondary to diabetic nephropathy. Acute renal failure secondary to volume loss from diarrhea. Diarrhea most likely from SPS (sodium polystyrene).  No indication for dialysis.  - Change IV fluids to sodium bicarbonate.  - Hold potassium binders. Would consider another potassium binder in future.   2. Hypertension: with syncopal episode. Home regimen of amlodipine, bumetanide, carvedilol, isosorbide mononitrate.  - Holding bumetanide and all other blood pressure agents.   3. Diabetes mellitus type II with chronic kidney disease: insulin dependent. History of poor  control, Hemoglobin A1c of 12% in 2019.   4. Secondary hyperparathyroidism: 5/14 PTH 114. Home regimen of sevelamer. Reordered.    LOS: 1 Siriyah Ambrosius 7/31/202011:02 AM

## 2018-10-29 NOTE — Progress Notes (Addendum)
[place] [facility] [place] NAME: Chandlar Kretz    MR#:  [id number]  DATE OF BIRTH:  10-19-1970  SUBJECTIVE:  CHIEF COMPLAINT:   Chief Complaint  Patient presents with  . Loss of Consciousness  Patient seen and evaluated today Still has some diarrhea Stool for C. difficile negative No complaints of nausea vomiting Generalized weakness No dizziness  REVIEW OF SYSTEMS:    ROS  CONSTITUTIONAL: No documented fever. Has fatigue, weakness. No weight gain, no weight loss.  EYES: No blurry or double vision.  ENT: No tinnitus. No postnasal drip. No redness of the oropharynx.  RESPIRATORY: No cough, no wheeze, no hemoptysis. No dyspnea.  CARDIOVASCULAR: No chest pain. No orthopnea. No palpitations. No syncope.  GASTROINTESTINAL: No nausea, no vomiting, has diarrhea. No abdominal pain. No melena or hematochezia.  GENITOURINARY: No dysuria or hematuria.  ENDOCRINE: No polyuria or nocturia. No heat or cold intolerance.  HEMATOLOGY: No anemia. No bruising. No bleeding.  INTEGUMENTARY: No rashes. No lesions.  MUSCULOSKELETAL: No arthritis. No swelling. No gout.  NEUROLOGIC: No numbness, tingling, or ataxia. No seizure-type activity.  No dizziness. PSYCHIATRIC: No anxiety. No insomnia. No ADD.   DRUG ALLERGIES:   Allergies  Allergen Reactions  . Losartan     Hyperkalemia (K>6)  . Morphine And Related Other (See Comments)    Overdose in hospital per patient report    VITALS:  Blood pressure 125/64, pulse 79, temperature 97.7 F (36.5 C), temperature source Oral, resp. rate 19, height 6\' 3"  (1.905 m), weight (!) 150.7 kg, SpO2 99 %.  PHYSICAL EXAMINATION:   Physical Exam  GENERAL:  48 y.o.-year-old patient lying in the bed with no acute distress.  EYES: Pupils equal, round, reactive to light and accommodation. No scleral icterus. Extraocular muscles intact.  HEENT: Head atraumatic, normocephalic. Oropharynx dry and nasopharynx clear.  NECK:   Supple, no jugular venous distention. No thyroid enlargement, no tenderness.  LUNGS: Normal breath sounds bilaterally, no wheezing, rales, rhonchi. No use of accessory muscles of respiration.  CARDIOVASCULAR: S1, S2 normal. No murmurs, rubs, or gallops.  ABDOMEN: Soft, nontender, nondistended. Bowel sounds present. No organomegaly or mass.  EXTREMITIES: No cyanosis, clubbing or edema b/l.    NEUROLOGIC: Cranial nerves II through XII are intact. No focal Motor or sensory deficits b/l.   PSYCHIATRIC: The patient is alert and oriented x 3.  SKIN: No obvious rash, lesion, or ulcer.   LABORATORY PANEL:   CBC Recent Labs  Lab 10/29/18 0523  WBC 10.6*  HGB 10.2*  HCT 33.9*  PLT 291   ------------------------------------------------------------------------------------------------------------------ Chemistries  Recent Labs  Lab 10/28/18 1445 10/29/18 0523  NA 134* 137  K 5.5* 5.1  CL 108 112*  CO2 17* 15*  GLUCOSE 113* 98  BUN 54* 54*  CREATININE 5.56* 5.42*  CALCIUM 8.5* 8.3*  MG 2.2  --   AST 11*  --   ALT 8  --   ALKPHOS 65  --   BILITOT 0.5  --    ------------------------------------------------------------------------------------------------------------------  Cardiac Enzymes No results for input(s): TROPONINI in the last 168 hours. ------------------------------------------------------------------------------------------------------------------  RADIOLOGY:  US Renal  Result Date: 10/29/2018 CLINICAL DATA:  ARF EXAM: RENAL / URINARY TRACT ULTRASOUND COMPLETE COMPARISON:  CT 07/08/2018. FINDINGS: Right Kidney: Renal measurements: 15.0 x 6.0 x 5.4 cm = volume: 257 mL . Echogenicity within normal limits. No mass or hydronephrosis visualized. Left Kidney: Renal measurements: 13.3 x 5.5 x 6.0 cm = volume: 230 mL. Echogenicity within  normal limits. No mass or hydronephrosis visualized. Bladder: Appears normal for degree of bladder distention. IMPRESSION: No acute or focal  abnormality identified. Electronically Signed   By: Marcello Moores  Register   On: 10/29/2018 09:17   Dg Chest Portable 1 View  Result Date: 10/28/2018 CLINICAL DATA:  Near syncope today. EXAM: PORTABLE CHEST 1 VIEW COMPARISON:  Single-view of the chest 05/22/2018 12/16/2017. FINDINGS: Pacing device is unchanged. Lung volumes are low but the lungs are clear. Heart size is normal. No pneumothorax or pleural fluid. No acute bony abnormality. IMPRESSION: No acute disease. Electronically Signed   By: Inge Rise M.D.   On: 10/28/2018 15:13     ASSESSMENT AND PLAN:   48 year old male patient with history of chronic systolic heart failure, type 2 diabetes mellitus, hyperlipidemia, hypertension, cardiomyopathy, proximal atrial fibrillation, coronary artery disease, history of cardiac stent placement last week at [facility], chronic kidney disease stage III currently under hospitalist service for dizziness and passing out.  -Hypotension Secondary to diarrhea improving  -Acute enteritis C. difficile testing negative PRN Imodium for diarrhea control  -Syncope secondary to low blood pressure and dehydration IV fluids Follow-up echocardiogram  -Acute on chronic kidney disease stage III Monitor renal function IV fluids Nephrology follow-up Avoid nephrotoxic meds  -Paroxysmal atrial fibrillation Amiodarone for rate control Eliquis for anticoagulation  -Hyperkalemia Clinical monitoring for now  -Chronic systolic heart failure Not in exacerbation Medical management  -Tobacco abuse Tobacco cessation counseled to the patient for 6 minutes Nicotine patch offered  -DW family and updated medical condition and treatment plan  All the records are reviewed and case discussed with Care Management/Social Worker. Management plans discussed with the patient, family and they are in agreement.  CODE STATUS: Full code  DVT Prophylaxis: SCDs  TOTAL TIME TAKING CARE OF THIS PATIENT: 36 minutes.    POSSIBLE D/C IN 1 to 2 DAYS, DEPENDING ON CLINICAL CONDITION.  Saundra Shelling M.D on 10/29/2018 at 10:03 AM  Between 7am to 6pm - Pager - [phone]  After 6pm go to [email or web address] - password [email or web address] [facility]  Office  [phone]  CC: Primary care physician; Caryl Never, MD  Note: This dictation was prepared with Dragon dictation along with smaller phrase technology. Any transcriptional errors that result from this process are unintentional.

## 2018-10-29 NOTE — Unmapped (Signed)
Left VM at [phone]   advising Pt to call EP Device Monitoring office for a manual transmission send. Instructed to call back to EP Remote Device Monitoring if need further assistance.

## 2018-10-30 LAB — BASIC METABOLIC PANEL
Anion gap: 9 (ref 5–15)
BUN: 48 mg/dL — ABNORMAL HIGH (ref 6–20)
CO2: 18 mmol/L — ABNORMAL LOW (ref 22–32)
Calcium: 8.3 mg/dL — ABNORMAL LOW (ref 8.9–10.3)
Chloride: 110 mmol/L (ref 98–111)
Creatinine, Ser: 4.87 mg/dL — ABNORMAL HIGH (ref 0.61–1.24)
GFR calc Af Amer: 15 mL/min — ABNORMAL LOW (ref >60)
GFR calc non Af Amer: 13 mL/min — ABNORMAL LOW (ref >60)
Glucose, Bld: 131 mg/dL — ABNORMAL HIGH (ref 70–99)
Potassium: 4.8 mmol/L (ref 3.5–5.1)
Sodium: 137 mmol/L (ref 135–145)

## 2018-10-30 LAB — PHOSPHORUS: Phosphorus: 4.6 mg/dL (ref 2.5–4.6)

## 2018-10-30 LAB — GLUCOSE, CAPILLARY: Glucose-Capillary: 112 mg/dL — ABNORMAL HIGH (ref 70–99)

## 2018-10-30 MED ORDER — ISOSORBIDE MONONITRATE ER 30 MG TABLET,EXTENDED RELEASE 24 HR
ORAL_TABLET | Freq: Every day | ORAL | 0 refills | 30 days
Start: 2018-10-30 — End: ?

## 2018-10-30 MED ORDER — CARVEDILOL 3.125 MG TABLET
ORAL_TABLET | 0 refills | 0 days
Start: 2018-10-30 — End: 2018-11-24

## 2018-10-30 MED ORDER — ELIQUIS 2.5 MG TABLET
ORAL_TABLET | 0 refills | 0 days
Start: 2018-10-30 — End: 2018-11-17

## 2018-10-30 MED ORDER — APIXABAN 2.5 MG PO TABS: 2.5000 mg | ORAL_TABLET | Freq: Two times a day (BID) | ORAL | 0 refills | Status: DC

## 2018-10-30 MED ORDER — AMLODIPINE BESYLATE 10 MG PO TABS
10.0000 mg | ORAL_TABLET | Freq: Every day | ORAL | Status: DC
  Administered 2018-10-30: 10 mg via ORAL
  Filled 2018-10-30: qty 1

## 2018-10-30 MED ORDER — CARVEDILOL 3.125 MG PO TABS: 3.1250 mg | ORAL_TABLET | ORAL | 0 refills | Status: DC

## 2018-10-30 MED ORDER — ADULT MULTIVITAMIN W/MINERALS CH: 1.0000 | ORAL_TABLET | Freq: Every day | ORAL | 0 refills | Status: AC

## 2018-10-30 MED ORDER — ISOSORBIDE MONONITRATE ER 30 MG PO TB24: 30.0000 mg | ORAL_TABLET | Freq: Every day | ORAL | 0 refills | Status: DC

## 2018-10-30 NOTE — Progress Notes (Signed)
RT to patient bedside for ABG due to patient lethargic state. ABG stick x1 unsuccessful. Patient alert and oriented, expressing wishes not to be stuck again for  ABG. Patient states he has a headache and does not feel good. Wants to be left alone to sleep. Per discussion with MD, CPAP QHS ordered. Patient communicated he has a CPAP at home but does not wear it and strongly expressed his wished to not wear one here. MD and RN aware. Will continue to monitor.

## 2018-10-30 NOTE — Progress Notes (Signed)
Pt has signed a.m.a. form. Awaiting wife to transport. Iv's and tele removed.

## 2018-10-30 NOTE — Progress Notes (Signed)
[facility]  ROUNDING NOTE   Subjective:   Patient states he wants to go home.  Discussed case with his wife over the phone who states she thinks patient should stay.  Patient refused ABG this morning.   UOP 3950  Creatinine 4.87 (5.42) CO2 18 (15)  Sodium bicarb infusion at 164mL.   Objective:  Vital signs in last 24 hours:  Temp:  [98.4 F (36.9 C)-99.1 F (37.3 C)] 98.4 F (36.9 C) (08/01 0802) Pulse Rate:  [77-85] 82 (08/01 0802) Resp:  [18-20] 18 (08/01 0802) BP: (137-158)/(55-84) 158/55 (08/01 0802) SpO2:  [93 %-99 %] 93 % (08/01 0802) Weight:  [149 kg] 149 kg (08/01 0503)  Weight change: -5.223 kg Filed Weights   10/28/18 1443 10/28/18 1737 10/30/18 0503  Weight: (!) 154.2 kg (!) 150.7 kg (!) 149 kg    Intake/Output: I/O last 3 completed shifts: In: 3871.5 [P.O.:740; I.V.:3131.5] Out: 4150 [Urine:4150]   Intake/Output this shift:  Total I/O In: 240 [P.O.:240] Out: 400 [Urine:400]  Physical Exam: General: NAD, laying in bed  Head: Normocephalic, atraumatic. Moist oral mucosal membranes  Eyes: Anicteric, PERRL  Neck: Supple, trachea midline  Lungs:  Clear to auscultation  Heart: Regular rate and rhythm  Abdomen:  Soft, nontender, obese  Extremities: No peripheral edema.  Neurologic: Nonfocal, moving all four extremities  Skin: No lesions  Access: none    Basic Metabolic Panel: Recent Labs  Lab 10/28/18 1445 10/29/18 0523 10/30/18 0412  NA 134* 137 137  K 5.5* 5.1 4.8  CL 108 112* 110  CO2 17* 15* 18*  GLUCOSE 113* 98 131*  BUN 54* 54* 48*  CREATININE 5.56* 5.42* 4.87*  CALCIUM 8.5* 8.3* 8.3*  MG 2.2  --   --   PHOS  --   --  4.6    Liver Function Tests: Recent Labs  Lab 10/28/18 1445  AST 11*  ALT 8  ALKPHOS 65  BILITOT 0.5  PROT 7.4  ALBUMIN 3.1*   No results for input(s): LIPASE, AMYLASE in the last 168 hours. No results for input(s): AMMONIA in the last 168 hours.  CBC: Recent Labs  Lab 10/28/18 1445  10/29/18 0523  WBC 7.5 10.6*  NEUTROABS 4.6  --   HGB 10.6* 10.2*  HCT 34.7* 33.9*  MCV 83.4 83.3  PLT 330 291    Cardiac Enzymes: No results for input(s): CKTOTAL, CKMB, CKMBINDEX, TROPONINI in the last 168 hours.  BNP: Invalid input(s): POCBNP  CBG: Recent Labs  Lab 10/28/18 2109 10/29/18 2147 10/30/18 0504  GLUCAP 141* 164* 112*    Microbiology: Results for orders placed or performed during the hospital encounter of 10/28/18  SARS Coronavirus 2 (CEPHEID - Performed in [place] hospital lab), Hosp Order     Status: None   Collection Time: 10/28/18  3:03 PM   Specimen: Nasopharyngeal Swab  Result Value Ref Range Status   SARS Coronavirus 2 NEGATIVE NEGATIVE Final    Comment: (NOTE) If result is NEGATIVE SARS-CoV-2 target nucleic acids are NOT DETECTED. The SARS-CoV-2 RNA is generally detectable in upper and lower  respiratory specimens during the acute phase of infection. The lowest  concentration of SARS-CoV-2 viral copies this assay can detect is 250  copies / mL. A negative result does not preclude SARS-CoV-2 infection  and should not be used as the sole basis for treatment or other  patient management decisions.  A negative result Bebo occur with  improper specimen collection / handling, submission of specimen other  than nasopharyngeal swab, presence of viral mutation(s) within the  areas targeted by this assay, and inadequate number of viral copies  (<250 copies / mL). A negative result must be combined with clinical  observations, patient history, and epidemiological information. If result is POSITIVE SARS-CoV-2 target nucleic acids are DETECTED. The SARS-CoV-2 RNA is generally detectable in upper and lower  respiratory specimens dur ing the acute phase of infection.  Positive  results are indicative of active infection with SARS-CoV-2.  Clinical  correlation with patient history and other diagnostic information is  necessary to determine patient  infection status.  Positive results do  not rule out bacterial infection or co-infection with other viruses. If result is PRESUMPTIVE POSTIVE SARS-CoV-2 nucleic acids Lacher BE PRESENT.   A presumptive positive result was obtained on the submitted specimen  and confirmed on repeat testing.  While 2019 novel coronavirus  (SARS-CoV-2) nucleic acids Ken be present in the submitted sample  additional confirmatory testing Lourenco be necessary for epidemiological  and / or clinical management purposes  to differentiate between  SARS-CoV-2 and other Sarbecovirus currently known to infect humans.  If clinically indicated additional testing with an alternate test  methodology [phone]) is advised. The SARS-CoV-2 RNA is generally  detectable in upper and lower respiratory sp ecimens during the acute  phase of infection. The expected result is Negative. Fact Sheet for Patients:  StrictlyIdeas.no Fact Sheet for Healthcare Providers: BankingDealers.co.za This test is not yet approved or cleared by the [place] FDA and has been authorized for detection and/or diagnosis of SARS-CoV-2 by FDA under an Emergency Use Authorization (EUA).  This EUA will remain in effect (meaning this test can be used) for the duration of the COVID-19 declaration under Section 564(b)(1) of the Act, 21 U.S.C. section 360bbb-3(b)(1), unless the authorization is terminated or revoked sooner. Performed at [facility], [address] [place], [place] [address]   C difficile quick scan w PCR reflex     Status: None   Collection Time: 10/28/18  4:35 PM   Specimen: STOOL  Result Value Ref Range Status   C Diff antigen NEGATIVE NEGATIVE Final   C Diff toxin NEGATIVE NEGATIVE Final   C Diff interpretation No C. difficile detected.  Final    Comment: Performed at [facility], [address] [place], [place] [address]  Gastrointestinal Panel by PCR , Stool      Status: None   Collection Time: 10/28/18  4:35 PM   Specimen: STOOL  Result Value Ref Range Status   Campylobacter species NOT DETECTED NOT DETECTED Final   Plesimonas shigelloides NOT DETECTED NOT DETECTED Final   Salmonella species NOT DETECTED NOT DETECTED Final   Yersinia enterocolitica NOT DETECTED NOT DETECTED Final   Vibrio species NOT DETECTED NOT DETECTED Final   Vibrio cholerae NOT DETECTED NOT DETECTED Final   Enteroaggregative E coli (EAEC) NOT DETECTED NOT DETECTED Final   Enteropathogenic E coli (EPEC) NOT DETECTED NOT DETECTED Final   Enterotoxigenic E coli (ETEC) NOT DETECTED NOT DETECTED Final   Shiga like toxin producing E coli (STEC) NOT DETECTED NOT DETECTED Final   Shigella/Enteroinvasive E coli (EIEC) NOT DETECTED NOT DETECTED Final   Cryptosporidium NOT DETECTED NOT DETECTED Final   Cyclospora cayetanensis NOT DETECTED NOT DETECTED Final   Entamoeba histolytica NOT DETECTED NOT DETECTED Final   Giardia lamblia NOT DETECTED NOT DETECTED Final   Adenovirus F40/41 NOT DETECTED NOT DETECTED Final   Astrovirus NOT DETECTED NOT DETECTED Final  Norovirus GI/GII NOT DETECTED NOT DETECTED Final   Rotavirus A NOT DETECTED NOT DETECTED Final   Sapovirus (I, II, IV, and V) NOT DETECTED NOT DETECTED Final    Comment: Performed at [facility], [address] [place], [place] [address]    Coagulation Studies: No results for input(s): LABPROT, INR in the last 72 hours.  Urinalysis: Recent Labs    10/28/18 2145  COLORURINE STRAW*  LABSPEC 1.008  PHURINE 5.0  GLUCOSEU 50*  HGBUR SMALL*  BILIRUBINUR NEGATIVE  KETONESUR NEGATIVE  PROTEINUR 100*  NITRITE NEGATIVE  LEUKOCYTESUR NEGATIVE      Imaging: US Renal  Result Date: 10/29/2018 CLINICAL DATA:  ARF EXAM: RENAL / URINARY TRACT ULTRASOUND COMPLETE COMPARISON:  CT 07/08/2018. FINDINGS: Right Kidney: Renal measurements: 15.0 x 6.0 x 5.4 cm = volume: 257 mL . Echogenicity within normal limits. No mass  or hydronephrosis visualized. Left Kidney: Renal measurements: 13.3 x 5.5 x 6.0 cm = volume: 230 mL. Echogenicity within normal limits. No mass or hydronephrosis visualized. Bladder: Appears normal for degree of bladder distention. IMPRESSION: No acute or focal abnormality identified. Electronically Signed   By: Marcello Moores  Register   On: 10/29/2018 09:17   Dg Chest Portable 1 View  Result Date: 10/28/2018 CLINICAL DATA:  Near syncope today. EXAM: PORTABLE CHEST 1 VIEW COMPARISON:  Single-view of the chest 05/22/2018 12/16/2017. FINDINGS: Pacing device is unchanged. Lung volumes are low but the lungs are clear. Heart size is normal. No pneumothorax or pleural fluid. No acute bony abnormality. IMPRESSION: No acute disease. Electronically Signed   By: Inge Rise M.D.   On: 10/28/2018 15:13     Medications:   .  sodium bicarbonate  infusion 1000 mL 100 mL/hr at 10/30/18 0454   . allopurinol  50 mg Oral QODAY  . amiodarone  200 mg Oral Daily  . amLODipine  10 mg Oral Daily  . apixaban  5 mg Oral BID  . atorvastatin  80 mg Oral QPM  . cholecalciferol  5,000 Units Oral Daily  . ezetimibe  10 mg Oral Daily  . FLUoxetine  60 mg Oral QHS  . gabapentin  300 mg Oral BID  . insulin detemir  40 Units Subcutaneous QHS  . multivitamin with minerals  1 tablet Oral Daily  . pantoprazole  40 mg Oral Daily  . Ensure Max Protein  11 oz Oral BID  . sevelamer carbonate  1,600 mg Oral TID WC  . sodium chloride flush  3 mL Intravenous Q12H  . ticagrelor  90 mg Oral BID   acetaminophen **OR** acetaminophen, albuterol, loperamide, ondansetron **OR** ondansetron (ZOFRAN) IV, QUEtiapine  Assessment/ Plan:  [name] is a 48 y.o. white male with hypertension, coronary artery disease, atrial fibrillation, hyperlipidemia, diabetes mellitus type II, systolic congestive heart failure, who is admitted to [facility] on 10/28/2018 for acute renal failure   1. Acute renal failure on chronic kidney disease stage IV  with metabolic acidosis, hyperkalemia and proteinuria Baseline creatinine of 3.86, GFR of 17 on 7/25.  Chronic kidney disease secondary to diabetic nephropathy. Followed by [facility].  Acute renal failure secondary to volume loss from diarrhea and IV contrast nephropathy from cardiac catheterization at [facility] on 7/24.  Diarrhea most likely from SPS (sodium polystyrene/kayexalate).  - No indication for dialysis.  - IV fluids to sodium bicarbonate.  - Hold potassium binders. Would consider another potassium binder in future.   2. Hypertension: with syncopal episode. Home regimen of amlodipine, bumetanide, carvedilol, isosorbide mononitrate.  -  Holding bumetanide  - Amlodipine restarted.    3. Diabetes mellitus type II with chronic kidney disease: insulin dependent. History of poor control, Hemoglobin A1c of 12% in 2019.   4. Secondary hyperparathyroidism: 5/14 PTH 114.  - Continue sevelamer with meals.    LOS: 2 Edwin [name] 8/1/20201:23 PM

## 2018-10-30 NOTE — Discharge Summary (Signed)
Nephrologist suggested to continue the treatment as renal function still not at baseline. Patient does not wanted to stay longer.  Patient decided to leave [place]. We have instructed the patient not to take potassium binders on discharge and follow with his nephrologist at [facility] as soon as possible.

## 2018-10-30 NOTE — Progress Notes (Signed)
[facility] [place] NAME: Edwin [name]    MR#:  [id number]  DATE OF BIRTH:  1970-09-08  SUBJECTIVE:  CHIEF COMPLAINT:   Chief Complaint  Patient presents with  . Loss of Consciousness  Patient seen and evaluated today No more diarrhea Stool for C. difficile negative No complaints of nausea vomiting Generalized weakness No dizziness Renal func improving, urinating well.  REVIEW OF SYSTEMS:    ROS  CONSTITUTIONAL: No documented fever. Has fatigue, weakness. No weight gain, no weight loss.  EYES: No blurry or double vision.  ENT: No tinnitus. No postnasal drip. No redness of the oropharynx.  RESPIRATORY: No cough, no wheeze, no hemoptysis. No dyspnea.  CARDIOVASCULAR: No chest pain. No orthopnea. No palpitations. No syncope.  GASTROINTESTINAL: No nausea, no vomiting, has diarrhea. No abdominal pain. No melena or hematochezia.  GENITOURINARY: No dysuria or hematuria.  ENDOCRINE: No polyuria or nocturia. No heat or cold intolerance.  HEMATOLOGY: No anemia. No bruising. No bleeding.  INTEGUMENTARY: No rashes. No lesions.  MUSCULOSKELETAL: No arthritis. No swelling. No gout.  NEUROLOGIC: No numbness, tingling, or ataxia. No seizure-type activity.  No dizziness. PSYCHIATRIC: No anxiety. No insomnia. No ADD.   DRUG ALLERGIES:   Allergies  Allergen Reactions  . Losartan     Hyperkalemia (K>6)  . Morphine And Related Other (See Comments)    Overdose in hospital per patient report    VITALS:  Blood pressure (!) 158/55, pulse 82, temperature 98.4 F (36.9 C), resp. rate 18, height 6\' 3"  (1.905 m), weight (!) 149 kg, SpO2 93 %.  PHYSICAL EXAMINATION:   Physical Exam  GENERAL:  48 y.o.-year-old patient lying in the bed with no acute distress.  EYES: Pupils equal, round, reactive to light and accommodation. No scleral icterus. Extraocular muscles intact.  HEENT: Head atraumatic, normocephalic. Oropharynx dry and nasopharynx clear.   NECK:  Supple, no jugular venous distention. No thyroid enlargement, no tenderness.  LUNGS: Normal breath sounds bilaterally, no wheezing, rales, rhonchi. No use of accessory muscles of respiration.  CARDIOVASCULAR: S1, S2 normal. No murmurs, rubs, or gallops.  ABDOMEN: Soft, nontender, nondistended. Bowel sounds present. No organomegaly or mass.  EXTREMITIES: No cyanosis, clubbing or edema b/l.    NEUROLOGIC: Cranial nerves II through XII are intact. No focal Motor or sensory deficits b/l.   PSYCHIATRIC: The patient is alert and oriented x 3.  SKIN: No obvious rash, lesion, or ulcer.   LABORATORY PANEL:   CBC Recent Labs  Lab 10/29/18 0523  WBC 10.6*  HGB 10.2*  HCT 33.9*  PLT 291   ------------------------------------------------------------------------------------------------------------------ Chemistries  Recent Labs  Lab 10/28/18 1445  10/30/18 0412  NA 134*   < > 137  K 5.5*   < > 4.8  CL 108   < > 110  CO2 17*   < > 18*  GLUCOSE 113*   < > 131*  BUN 54*   < > 48*  CREATININE 5.56*   < > 4.87*  CALCIUM 8.5*   < > 8.3*  MG 2.2  --   --   AST 11*  --   --   ALT 8  --   --   ALKPHOS 65  --   --   BILITOT 0.5  --   --    < > = values in this interval not displayed.   ------------------------------------------------------------------------------------------------------------------  Cardiac Enzymes No results for input(s): TROPONINI in the last 168 hours. ------------------------------------------------------------------------------------------------------------------  RADIOLOGY:  US  Renal  Result Date: 10/29/2018 CLINICAL DATA:  ARF EXAM: RENAL / URINARY TRACT ULTRASOUND COMPLETE COMPARISON:  CT 07/08/2018. FINDINGS: Right Kidney: Renal measurements: 15.0 x 6.0 x 5.4 cm = volume: 257 mL . Echogenicity within normal limits. No mass or hydronephrosis visualized. Left Kidney: Renal measurements: 13.3 x 5.5 x 6.0 cm = volume: 230 mL. Echogenicity within normal limits.  No mass or hydronephrosis visualized. Bladder: Appears normal for degree of bladder distention. IMPRESSION: No acute or focal abnormality identified. Electronically Signed   By: Marcello Moores  Register   On: 10/29/2018 09:17     ASSESSMENT AND PLAN:   48 year old male patient with history of chronic systolic heart failure, type 2 diabetes mellitus, hyperlipidemia, hypertension, cardiomyopathy, proximal atrial fibrillation, coronary artery disease, history of cardiac stent placement last week at [facility], chronic kidney disease stage III currently under hospitalist service for dizziness and passing out.  -Hypotension Secondary to diarrhea improving  -Acute enteritis- Patient was recently started on potassium binders which was causing diarrhea and stopped now. C. difficile testing negative PRN Imodium for diarrhea control  -Syncope secondary to low blood pressure and dehydration IV fluids Reviewed echocardiogram-EF of 45% with some hypokinesis. He follows with cardiologist  -Acute on chronic kidney disease stage 4 Monitor renal function IV fluids Nephrology follow-up appreciated. Avoid nephrotoxic meds As renal function is still not to baseline, advised to continue bicarbonate drip and monitor renal function.  -Paroxysmal atrial fibrillation Amiodarone for rate control Eliquis for anticoagulation  -Hyperkalemia Clinical monitoring for now Potassium binders held due to diarrhea, continue to monitor.  -Chronic systolic heart failure Not in exacerbation Medical management  -Tobacco abuse Tobacco cessation counseled to the patient for 6 minutes Nicotine patch offered    All the records are reviewed and case discussed with Care Management/Social Worker. Management plans discussed with the patient, family and they are in agreement.  CODE STATUS: Full code  DVT Prophylaxis: SCDs  TOTAL TIME TAKING CARE OF THIS PATIENT: 36 minutes.   POSSIBLE D/C IN 1 to 2 DAYS, DEPENDING  ON CLINICAL CONDITION.  Vaughan Basta M.D on 10/30/2018 at 3:35 PM  Between 7am to 6pm - Pager - [phone]   After 6pm go to [email or web address] - password [email or web address] [facility]  Office  [phone]  CC: Primary care physician; Caryl Never, MD  Note: This dictation was prepared with Dragon dictation along with smaller phrase technology. Any transcriptional errors that result from this process are unintentional.

## 2018-11-03 DIAGNOSIS — F439 Reaction to severe stress, unspecified: Secondary | ICD-10-CM

## 2018-11-03 DIAGNOSIS — F4320 Adjustment disorder, unspecified: Principal | ICD-10-CM
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
## 2018-11-04 ENCOUNTER — Telehealth: Admit: 2018-11-04 | Discharge: 2018-11-04 | Payer: MEDICARE | Attending: Clinical | Primary: Clinical

## 2018-11-05 NOTE — Unmapped (Signed)
I was the supervising physician in the delivery of the service. Cornellius Kropp D Gia Lusher, MD

## 2018-11-10 NOTE — Unmapped (Signed)
[facility]  [address] [place], [place] [address]    [phone]    This note is to let you know that Jeremy Oliver did not show for their scheduled Physical Therapy follow-up session today.  Please contact me if you have any questions or concerns.    Thank you for this referral,     Signed: Ashok Cordia, PT  11/10/2018 1:08 PM

## 2018-11-11 ENCOUNTER — Ambulatory Visit: Admit: 2018-11-11 | Discharge: 2018-11-11 | Payer: MEDICARE

## 2018-11-11 MED ORDER — ACETAMINOPHEN 500 MG TABLET
3 refills | 0 days | Status: CP
Start: 2018-11-11 — End: 2019-11-11
  Filled 2018-11-22: qty 180, 30d supply, fill #0

## 2018-11-11 MED FILL — BUMETANIDE 2 MG TABLET: 90 days supply | Qty: 90 | Fill #1 | Status: AC

## 2018-11-11 MED FILL — DICLOFENAC 1 % TOPICAL GEL: 17 days supply | Qty: 100 | Fill #5 | Status: AC

## 2018-11-11 MED FILL — DICLOFENAC 1 % TOPICAL GEL: TOPICAL | 17 days supply | Qty: 100 | Fill #5

## 2018-11-11 MED FILL — NITROGLYCERIN 0.4 MG SUBLINGUAL TABLET: SUBLINGUAL | 28 days supply | Qty: 75 | Fill #3

## 2018-11-11 MED FILL — BUMETANIDE 2 MG TABLET: ORAL | 90 days supply | Qty: 90 | Fill #1

## 2018-11-11 MED FILL — NITROGLYCERIN 0.4 MG SUBLINGUAL TABLET: 28 days supply | Qty: 75 | Fill #3 | Status: AC

## 2018-11-12 ENCOUNTER — Telehealth: Admit: 2018-11-12 | Discharge: 2018-11-13 | Payer: MEDICARE | Attending: Clinical | Primary: Clinical

## 2018-11-12 DIAGNOSIS — F439 Reaction to severe stress, unspecified: Principal | ICD-10-CM

## 2018-11-12 DIAGNOSIS — F4320 Adjustment disorder, unspecified: Secondary | ICD-10-CM

## 2018-11-12 MED ORDER — LOPERAMIDE 2 MG CAPSULE: 2 mg | capsule | Freq: Four times a day (QID) | 0 refills | 8 days | Status: AC

## 2018-11-12 MED ORDER — LOPERAMIDE 2 MG CAPSULE
ORAL_CAPSULE | Freq: Four times a day (QID) | ORAL | 0 refills | 8.00000 days | Status: CP | PRN
Start: 2018-11-12 — End: 2018-11-24

## 2018-11-12 NOTE — Unmapped (Signed)
General Consent to Treat (GCT) for non-epic video visits only: Verbal consent    [name] reviewed and updated in Epic:  Completed or Not completed (Reason complete)    PHQ9  Thoughts that you would be better off dead, or of hurting yourself in some way: Not at all  PHQ-9 TOTAL SCORE: 1    GAD7  GAD-7 Total Score: 2    Physical address reviewed and completed

## 2018-11-12 NOTE — Unmapped (Signed)
CCM Follow-up Call    PCP Action:pt did have immodium AD while inpatient last, states it worked ok. also states, :wouldn't mind something stronger has app w/ Dr. Hulan Fess Wednesday.      CCM Action:f/u diarrhea      Follow up/Discuss next call: diarrhea, dialysis, CAD       Pt requesting immodium AD.  Continues to have terrible diarrhea since discharged from hospital.    Pt has appt w/ Dr. Hulan Fess to discuss dialysis.    Discussed with pt importance of dialysis, verbalizes understanding but doesn't want to have it done.  Does want to have recommended stents placed in heart, however must have dialysis prior to this b/c stents will require too much dye for current kidney function.    Pt speaking slowly says he's tired, can't sleep.  No c/o CP, no c/o SOB,  C/o diarrhea (has had for awhile, covid19 neg.)    Pt requesting rx for diarrhea to go to [facility].    Requests CCM call him back.   Pt unsure of diarrhea diagnosis, says, I think it's a virus.  Pt is drinking plenty of water but going right through him.    Low health literacy.     Will follow up w/ provider.

## 2018-11-12 NOTE — Unmapped (Signed)
Addended by: Trina Ao on: 11/12/2018 04:24 PM     Modules accepted: Orders

## 2018-11-12 NOTE — Unmapped (Signed)
INTERNAL MEDICINE COUNSELING SESSION NOTE    LENGTH OF SESSION:  Psychotherapy Session 45 minutes    TYPE OF PSYCHOTHERAPY: Behavioral, Supportive    REASON FOR TREATMENT: Problem Solving Treatment and Mind Body Stress Reduction skill training for??impact of health issues,??stress and self care management; link impact of effort to mood.        Patient participated in visit from his friend's home where he and his wife are staying  Patient indicated that:      they felt safe in their setting to engage in phone counseling visit.    I spent 45 minutes on the real-time audio and video with the patient. I spent an additional 5 minutes on pre- and post-visit activities.     The patient was physically located in [place] or a state in which I am permitted to provide care. The patient and/or parent/guardian understood that s/he Bains incur co-pays and cost sharing, and agreed to the telemedicine visit. The visit was reasonable and appropriate under the circumstances given the patient's presentation at the time.    The patient and/or parent/guardian has been advised of the potential risks and limitations of this mode of treatment (including, but not limited to, the absence of in-person examination) and has agreed to be treated using telemedicine. The patient's/patient's family's questions regarding telemedicine have been answered.     If the visit was completed in an ambulatory setting, the patient and/or parent/guardian has also been advised to contact their provider???s office for worsening conditions, and seek emergency medical treatment and/or call 911 if the patient deems either necessary.             REVIEW OF FUNCTIONING SINCE LAST VISIT    MOOD (0-10) =  fine    10  of 10 with 10 being the best. At last visit was   10    SATISFACTION WITH EFFORT IN CARING FOR MOOD (0-10) =    8 of 10 with 10 being the best.  At last visit was   10    PAIN:     0 - I finally started using the gel for my leg. Hadn't helped when he tried it before. Able to leg brace again.   At last visit was    7    PHQ-9 SCORE:     1  At last visit was  0    GAD-7 SCORE:     2  At last visit was    0      CURRENT SUICIDAL/HOMICIDAL IDEATION:   Denies SI / HI      PROBLEM LIST:    Patient Active Problem List   Diagnosis   ??? CAD (coronary artery disease) (RAF-[name])   ??? Idiopathic chronic gout of left knee   ??? Hypercholesteremia   ??? Essential hypertension (RAF-[name])   ??? 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 Osteoarthritis   ??? Chronic nonalcoholic liver disease   ??? GERD (gastroesophageal reflux disease)   ??? Mild intermittent asthma without complication   ??? Chronic pain syndrome   ??? Paroxysmal atrial fibrillation (CMS-[name])   ??? OSA (obstructive sleep apnea)   ??? Tobacco use disorder   ??? Status post cardiac pacemaker procedure   ??? Diabetes mellitus with gastroparesis (CMS-[name])   ??? Cardiac pacemaker in situ   ??? Recurrent major depressive disorder, in full remission (CMS-[name])   ??? NSTEMI (non-ST elevated myocardial infarction) (CMS-[name])   ???  Adjustment disorder, unspecified   ??? Chronic heart failure with reduced ejection fraction and diastolic dysfunction (CMS-[name])   ??? Hyperkalemia   ??? CKD (chronic kidney disease) stage 4, GFR 15-29 ml/min (CMS-[name])   ??? Acquired left foot drop   ??? Decreased sensation of lower extremity, left   ??? Chronic anticoagulation   ??? Type 2 diabetes mellitus with stage 4 chronic kidney disease, with long-term current use of insulin (CMS-[name])   ??? Situational stress         MEDICATION COMPLIANCE:     Taking all medications regularly?  yes     Any changes in medication since last visit?  yes - started using gel on leg for pain    Any missed doses? yes - missed 1 dose      Current Outpatient Medications on File Prior to Visit   Medication Sig Dispense Refill   ??? acetaminophen (TYLENOL) 500 MG tablet TAKE 2 TABLETS (1 000MG ) BY MOUTH EVERY 8 HOURS AS NEEDED FOR PAIN 180 each 3   ??? albuterol HFA 90 mcg/actuation inhaler Inhale 2 puffs into the lungs every 6 (six) hours as needed for wheezing or shortness of breath. 18 g 2   ??? alirocumab (PRALUENT) 150 mg/mL subcutaneous injection Inject 1 mL (150 mg total) under the skin every fourteen (14) days. 4 Syringe 11   ??? allopurinoL (ZYLOPRIM) 100 MG tablet Take 0.5 tablets (50 mg total) by mouth daily. 45 tablet 3   ??? amiodarone (PACERONE) 200 MG tablet TAKE 1 TABLET BY MOUTH ONCE DAILY 90 tablet 3   ??? amLODIPine (NORVASC) 10 MG tablet Take 1 tablet (10 mg total) by mouth daily. 90 tablet 3   ??? apixaban (ELIQUIS) 2.5 mg Tab Take 1 tablet (2.5 mg total) by mouth 2 (two) times daily. 60 tablet 0   ??? arm brace Misc 1 each by Miscellaneous route daily. 1 each 0   ??? atorvastatin (LIPITOR) 80 MG tablet Take 1 tablet (80 mg total) by mouth daily. 90 tablet 3   ??? blood sugar diagnostic Strp USE TO CHECK BLOOD SUGAR 4 TIMES DAILY (BEFORE MEALS AND NIGHTLY) 100 each 3   ??? blood-glucose meter kit Use to check blood sugars, #1 meter, #100 strips. Freestyle glucose meter. ICD-9 250.00 1 each 0   ??? bumetanide (BUMEX) 2 MG tablet Take 1 tablet (2 mg total) by mouth daily. 90 tablet 3   ??? carvediloL (COREG) 25 MG tablet Take 1.5 tablets (37.5 mg total) by mouth Two (2) times a day. 270 tablet 3   ??? carvediloL (COREG) 3.125 MG tablet Take 1 tablet (3.125 mg total) by mouth See admin instructions. Take 1 tablet (25MG ) by mouth every morning and 2 tablets (50MG ) by mouth every night 60 tablet 0   ??? cholecalciferol, vitamin D3, 5,000 unit capsule Take 1 capsule (5,000 Units total) by mouth daily. 100 capsule 0   ??? diclofenac sodium (VOLTAREN) 1 % gel Apply 2 grams topically Three (3) times a day. 100 g 11   ??? empty container Misc Use as directed to dispose of injectable medications 1 each 3   ??? ezetimibe (ZETIA) 10 mg tablet TAKE 1 TABLET BY MOUTH ONCE DAILY 90 each 3   ??? FLUoxetine (PROZAC) 40 MG capsule Take 2 capsules (80 mg total) by mouth daily. 180 capsule 3   ??? gabapentin (NEURONTIN) 300 MG capsule Take 1 capsule (300 mg total) by mouth two (2) times a day. 180 capsule 3   ??? insulin ASPART (NOVOLOG  FLEXPEN U-100 INSULIN) 100 unit/mL (3 mL) injection pen Inject 0.2 mL (20 Units total) under the skin three (3) times a day with a meal. 15 mL 11   ??? insulin detemir U-100 (LEVEMIR) 100 unit/mL (3 mL) injection pen Inject 0.4 mL (40 Units total) under the skin nightly. 15 mL 3   ??? insulin syringe-needle U-100 0.3 mL 31 gauge x 5/16 Syrg Use to inject insulin twice daily with meals 60 each 0   ??? isosorbide mononitrate (IMDUR) 30 MG 24 hr tablet Take 4 tablets (120 mg total) by mouth daily. 120 tablet 3   ??? isosorbide mononitrate (IMDUR) 30 MG 24 hr tablet Take 1 tablet (30 mg total) by mouth daily. 30 tablet 0   ??? nitroglycerin (NITROSTAT) 0.4 MG SL tablet Place 1 tablet (0.4 mg total) under the tongue every five (5) minutes as needed for chest pain. 100 each 3   ??? omeprazole (PRILOSEC) 40 MG capsule Take 1 capsule (40 mg total) by mouth Two (2) times a day (30 minutes before a meal). 180 capsule 3   ??? ondansetron (ZOFRAN) 4 MG tablet Take 1 tablet (4 mg total) by mouth every eight (8) hours as needed for nausea for up to 4 days. 10 tablet 0   ??? QUEtiapine (SEROQUEL) 50 MG tablet Take 1 tablet (50 mg total) by mouth two (2) times a day as needed (headache). 180 tablet 3   ??? rivaroxaban (XARELTO) 15 mg Tab Take 1 tablet (15 mg total) by mouth daily with evening meal. 90 tablet 3   ??? semaglutide 0.25 mg or 0.5 mg(2 mg/1.5 mL) PnIj Inject 0.5 mg under the skin every seven (7) days. 4.5 mL 3   ??? sevelamer (RENVELA) 800 mg tablet TAKE 2 TABLETS (1600MG ) BY MOUTH THREE TIMES DAILY WITH MEALS 540 tablet 3   ??? sodium polystyrene, SPS, with sorbitol (SPS, WITH SORBITOL,) 15-20 gram/60 mL Susp Take 60 mL (15 g) by mouth once a week. 473 mL 3   ??? ticagrelor (BRILINTA) 90 mg Tab TAKE 1 TABLET BY MOUTH TWICE DAILY 180 tablet 3   ??? UNIFINE PENTIPS 31 gauge x 5/16 Ndle USE WITH INSULIN AND VICTOZA 5 TIMES DAILY 400 each 3   ??? [DISCONTINUED] liraglutide (VICTOZA 2-PAK) 0.6 mg/0.1 mL (18 mg/3 mL) injection Inject 0.1 mL (0.6 mg total) under the skin daily. 3 mL 1   ??? [DISCONTINUED] ranolazine (RANEXA) 500 MG 12 hr tablet Take by mouth two (2) times a day. 60 tablet 0   ??? [DISCONTINUED] traZODone (DESYREL) 50 MG tablet Take 1 tablet (50 mg total) by mouth nightly. 90 tablet 3     No current facility-administered medications on file prior to visit.            SLEEPING:    everybody thinks I'm sleeping too much now  Sleeping about 14 hours, most of the time during the day.  Interrupted sleep during the night and then catch up  Everyone thinks it Elahi my kidneys starting to shut down. He says his doctors think difficulty with kidneys is related to sleep.          APPETITE:    normal I portion all my stuff out now          SUBSTANCE USE:  deferred         SESSION FOCUS:    Today's Problem Solving addressed:    1. Situational stress    2. Adjustment disorder, unspecified type  Concerns about going on dialysis   Explored and discussed meaning and impact.  nervous, worried  Stop me from doing stuff I want to do.   Had been trying to get stronger to be able to hunt  Heart at 25% efficiency. Need for future 3 heart surgical procedures to be done.      Daughter did move with boyfriend  In touch daily 1-2x with her and grandson by video or phone.  Still feels like something is off with daughter and boyfriend, but says there hasn't been anything specific.      Worried about wife's knee surgery and lack of guidance/after care for her from [employer]    Sleep - revisited benefits of sleep at night for mood and health  He says sleeping during the high heat of the day is helpful for him  Last 3 hospitalizations have been related to chest pain and dehydration    Explored aging and life choices  Brother with cancer, stopped cancer treatment. Brother is living 1 day at a time. Father retired at 62 and died at 51.  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    Discussed issues presented and options for continued and/or improved self care management         Additional:     Medication adherence and barriers to the treatment plan have been addressed. Opportunities to optimize healthy behaviors have been discussed. Patient / caregiver voiced understanding.          ASSESSMENT      ENGAGEMENT: Patient presented a willingness to participate in treatment.  PARTICIPATION QUALITY:    Active  MOOD:  fine     AFFECT:  Mildly reactive   MENTAL STATUS:     alert and oriented, concerned  MODES OF INTERVENTION used in this session:    Assessment, Clarification, Education, Exploration, Problem Solving, Support, Reframing or Week review      TREATMENT PLAN    Identified treatment goals:????improve self care management, develop / improve stress management skills or healthy sleep hygiene  ??  Patient-stated health goal:????support    Problem Solving Treatment and Mind Body Stress Reduction Skill Training:  Next visit will be   9 of up to 12 visits.    RCO Friday 9/11 at 9am D[employer] video

## 2018-11-12 NOTE — Unmapped (Signed)
I was the supervising physician in the delivery of the service. Esther Bradstreet E Kimel-Scott, MD

## 2018-11-16 NOTE — Unmapped (Signed)
Call scheduled too soon. Patient has enough medication. Rescheduling for about 2 weeks from now.

## 2018-11-17 ENCOUNTER — Ambulatory Visit: Admit: 2018-11-17 | Discharge: 2018-11-18 | Payer: MEDICARE

## 2018-11-17 DIAGNOSIS — Z09 Encounter for follow-up examination after completed treatment for conditions other than malignant neoplasm: Secondary | ICD-10-CM

## 2018-11-17 DIAGNOSIS — R42 Dizziness and giddiness: Secondary | ICD-10-CM

## 2018-11-17 DIAGNOSIS — I10 Essential (primary) hypertension: Secondary | ICD-10-CM

## 2018-11-17 DIAGNOSIS — N184 Chronic kidney disease, stage 4 (severe): Secondary | ICD-10-CM

## 2018-11-17 DIAGNOSIS — I2510 Atherosclerotic heart disease of native coronary artery without angina pectoris: Principal | ICD-10-CM

## 2018-11-17 DIAGNOSIS — I480 Paroxysmal atrial fibrillation: Secondary | ICD-10-CM

## 2018-11-17 MED ORDER — AMLODIPINE 5 MG TABLET
ORAL_TABLET | Freq: Every day | ORAL | 3 refills | 90.00000 days | Status: CP
Start: 2018-11-17 — End: 2019-11-17

## 2018-11-17 NOTE — Unmapped (Signed)
[facility] - [facility]  Established Visit    Reason for Visit:  Follow-up for hospital follow-up    1. Orthostatic dizziness    2. Coronary artery disease involving native coronary artery of native heart without angina pectoris    3. Hospital discharge follow-up    4. Essential hypertension (RAF-[name])    5. Paroxysmal atrial fibrillation (CMS-[name])    6. CKD (chronic kidney disease) stage 4, GFR 15-29 ml/min (CMS-[name])        Assessment/Plans:    Orthostatic dizziness  BP supine: 135/81, standing: 111/67  Given the patient's orthostatic BP changes and mild swelling in L ankle, we will decrease his amlodipine to 5mg .   - Decrease amlodipine from 10mg  to 5mg  qd    HTN  Experiencing orthostatic BP changes and some lightheadedness, as above. He reports spells of tachycardia and increased BP, but his baseline BP has been ~130s/80s.  - Decrease amlodipine from 10mg  to 5mg   - Continue other current medications  - Bring all medications to next clinic visit    Paroxysmal Atrial Fibrillation  Has experienced two spells of tachycardia and hypertension since hospital discharge. One of these was associated with chest pain, for which he took nitroglycerin. He will follow-up with cardiology tomorrow.  - Follow-up with cardiology 11/18/18    CKD  Cr was elevated to 5.56 in the setting of hypovolemia in the hospital. This decreased to 4.87 on 10/30/18 (GFR 13). The patient expresses that he wants to take a hunting trip with his family before starting dialysis. He will follow-up with nephrology in September.  - Follow-up with nephrology 12/07/18      Return in about 4 weeks (around 12/15/2018).     Medication adherence and barriers to the treatment plan have been addressed. Opportunities to optimize healthy behaviors have been discussed. Patient / caregiver voiced understanding.      I spent a total of 40 minutes ([id number]) minutes face-to-face with the patient, of which greater than 50% was spent counseling the patient regarding review of all medications, importance of adherence to medication changes made in this visit, review of need for dialysis an impact on daily life it would have.           __________________________________________________________    HPI:  The patient is a 48yo man with a PMHx of CAD s/p multiple stents placed, HTN, T2DM on insulin, paroxysmal afib, anxiety, depression, and others listed below who presents for hospital follow-up.    The patient was recently hospitalized (7/30-8/1) for syncope 2/2 hypotension/dehydration from vomiting and diarrhea. He was unhappy with his care in the hospital and walked out. Since leaving the hospital, he has experienced two spells that he attributes to a fib. During these spells, he says that his heart rate increases and his blood pressure increases to 200/130 when he sits up. Lying down for ~10 min helps him relax and his BP returns to ~130s/80s. This most recently occurred 2 days ago. He had some chest pain with this, and he took one nitroglycerin which resolved the pain. He occasionally feels lightheaded when sitting up or standing up.     He reports that there are still 3 areas of occlusion in his heart vessels. However, he was told he would need to be on dialysis before these could be addressed. He says that he has a goal of going hunting with his family before starting dialysis. He reports that his BG values are usually 130-150.     The patient  reports that he has started exercising more, including weightlifting. He quit drinking sugary drinks and reports compliance with all his medications. He denies nausea, vomiting, or SOB. He has occasional diarrhea, for which he takes loperamide.        ROS:  Pertinent items are noted in HPI.    Patient Active Problem List   Diagnosis   ??? CAD (coronary artery disease) (RAF-[name])   ??? Idiopathic chronic gout of left knee   ??? Hypercholesteremia   ??? Essential hypertension (RAF-[name])   ??? 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 Osteoarthritis   ??? Chronic nonalcoholic liver disease   ??? GERD (gastroesophageal reflux disease)   ??? Mild intermittent asthma without complication   ??? Chronic pain syndrome   ??? Paroxysmal atrial fibrillation (CMS-[name])   ??? OSA (obstructive sleep apnea)   ??? Tobacco use disorder   ??? Status post cardiac pacemaker procedure   ??? Diabetes mellitus with gastroparesis (CMS-[name])   ??? Cardiac pacemaker in situ   ??? Recurrent major depressive disorder, in full remission (CMS-[name])   ??? NSTEMI (non-ST elevated myocardial infarction) (CMS-[name])   ??? Adjustment disorder, unspecified   ??? Chronic heart failure with reduced ejection fraction and diastolic dysfunction (CMS-[name])   ??? Hyperkalemia   ??? CKD (chronic kidney disease) stage 4, GFR 15-29 ml/min (CMS-[name])   ??? Acquired left foot drop   ??? Decreased sensation of lower extremity, left   ??? Chronic anticoagulation   ??? Type 2 diabetes mellitus with stage 4 chronic kidney disease, with long-term current use of insulin (CMS-[name])   ??? Situational stress       Medications:  Reviewed in EPIC  Medications prescribed or ordered upon discharge were reviewed today and reconciled with the most recent outpatient medication list.  Medication reconciliation was conducted by a prescribing practitioner, or clinical pharmacist.    Physical Exam:   Vital Signs:   BP 134/89 Comment: omron avg - Pulse 85  - Temp 36.8 ??C (98.2 ??F)  - Resp 18  - Wt (!) 152.6 kg (336 lb 6.4 oz)  - SpO2 97%  - BMI 42.05 kg/m??     Vitals:    11/17/18 0850   Orthostatic BP: 111/67   Orthostatic Pulse: 95   BP Site: R Arm   BP Position: Standing     Gen: Well appearing, NAD  CV: RRR, no murmurs  Pulm: CTA bilaterally, no crackles or wheezes  GI: Obese, Soft, Non-tender, normal BS.   Extremities: mild swelling in L ankle, brace supporting left foot        Renee Rival, MS-3    I have verified all student documentation or findings.  I have personally performed or re-performed the physical exam and medical decision making. Kurtis Bushman, MD

## 2018-11-17 NOTE — Unmapped (Signed)
.  Omron BPs  BP#1  133/91  BP#2   132/85  BP#3   138/90    Average BP     134/89

## 2018-11-17 NOTE — Unmapped (Addendum)
Please bring all of your medications to your next appointment with Dr. Hulan Fess.    Your blood pressure is dropping significantly when you go from lying to standing.  Please get up slowly.    Also decrease the amlodipine for your blood pressure to 5mg  once a day.  I sent a new prescription to [facility]. In the meantime you can cut the amlodipine 10mg  tablets in half and use.     Medication changes:  - decrease to amlodipine 5mg  once a day

## 2018-11-18 ENCOUNTER — Ambulatory Visit
Admit: 2018-11-18 | Discharge: 2018-11-19 | Payer: MEDICARE | Attending: Cardiovascular Disease | Primary: Cardiovascular Disease

## 2018-11-18 DIAGNOSIS — I214 Non-ST elevation (NSTEMI) myocardial infarction: Principal | ICD-10-CM

## 2018-11-18 NOTE — Unmapped (Addendum)
Continue your medications as prescribed  It is OK to do some light walking and start cardiac rehabilitation   Return to see me in 3 months, sooner for any new concerns    Elpidio Anis MD [facility]  [facility]   Division of Cardiology  [phone] pgr    Dictated using Animal nutritionist, please excuse typos

## 2018-11-18 NOTE — Unmapped (Signed)
PCP:  Kurtis Bushman, Oliver    Patient ID: Jeremy Oliver is a 48 y.o. male.    Assessment and Plan:   Jeremy Oliver is doing better today, and there is no evidence of decompensated heart failure on examination.  His blood pressure is reasonably well controlled and his weight is stable.  He does have severe diffuse multivessel coronary disease with some additional targets for revascularization in the LAD and left circumflex artery.  Unfortunately he is at very high risk for contrast nephropathy and we should only consider repeat revascularization if there is a plan to initiate hemodialysis.    He has nephrology follow-up scheduled.  I suspect that at some point he will require the initiation of intermittent hemodialysis.  Once this occurs we will have more options for percutaneous coronary revascularization but for now we will continue try to maximize his medical therapies and palliate his symptoms.     I encouraged him to continue daily weight monitoring and regular blood pressure checks as well.  If he remains in normal sinus rhythm we [place] consider discontinuation of his amiodarone in the long-term as well.    1. Coronary artery disease involving native coronary artery of native heart without angina pectoris  2. Chronic heart failure with reduced ejection fraction and diastolic dysfunction (CMS-[name])  3. Paroxysmal atrial fibrillation (CMS-[name])  4. Essential hypertension (RAF-[name])    Return in about 3 months (around 02/18/2019).    Jeremy Oliver [facility] FSCAI  [facility]   Division of Cardiology  [phone] pgr    Dictated using Dragon software, please excuse typos    Subjective:      Jeremy Route Morison return to clinic today.  He was recently admitted to the hospital again for acute heart failure and acute coronary syndrome and a repeat diagnostic cath showed severe stenosis of the right coronary artery.  Another stent was placed in his distal right coronary artery and he was discharged home.  He returned to [facility] with worsening renal insufficiency and was hospitalized briefly last week.     He denies any chest pain, palpitations, syncope, falls or injuries.  He does have chronic dyspnea and currently has NYHA functional class II-III symptoms.  He is limited by left leg pain as well as a result of previous nerve injury during ECMO cannulation.    Patient Active Problem List   Diagnosis   ??? CAD (coronary artery disease) (RAF-[name])   ??? Idiopathic chronic gout of left knee   ??? Hypercholesteremia   ??? Essential hypertension (RAF-[name])   ??? 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 Osteoarthritis   ??? Chronic nonalcoholic liver disease   ??? GERD (gastroesophageal reflux disease)   ??? Mild intermittent asthma without complication   ??? Chronic pain syndrome   ??? Paroxysmal atrial fibrillation (CMS-[name])   ??? OSA (obstructive sleep apnea)   ??? Tobacco use disorder   ??? Status post cardiac pacemaker procedure   ??? Diabetes mellitus with gastroparesis (CMS-[name])   ??? Cardiac pacemaker in situ   ??? Recurrent major depressive disorder, in full remission (CMS-[name])   ??? NSTEMI (non-ST elevated myocardial infarction) (CMS-[name])   ??? Adjustment disorder, unspecified   ??? Chronic heart failure with reduced ejection fraction and diastolic dysfunction (CMS-[name])   ??? Hyperkalemia   ??? CKD (chronic kidney disease) stage 4, GFR  15-29 ml/min (CMS-[name])   ??? Acquired left foot drop   ??? Decreased sensation of lower extremity, left   ??? Chronic anticoagulation   ??? Type 2 diabetes mellitus with stage 4 chronic kidney disease, with long-term current use of insulin (CMS-[name])   ??? Situational stress       Current Outpatient Medications   Medication Sig Dispense Refill   ??? acetaminophen (TYLENOL) 500 MG tablet TAKE 2 TABLETS (1 000MG ) BY MOUTH EVERY 8 HOURS AS NEEDED FOR PAIN 180 each 3   ??? albuterol HFA 90 mcg/actuation inhaler Inhale 2 puffs into the lungs every 6 (six) hours as needed for wheezing or shortness of breath. 18 g 2   ??? alirocumab (PRALUENT) 150 mg/mL subcutaneous injection Inject 1 mL (150 mg total) under the skin every fourteen (14) days. 4 Syringe 11   ??? allopurinoL (ZYLOPRIM) 100 MG tablet Take 0.5 tablets (50 mg total) by mouth daily. 45 tablet 3   ??? amiodarone (PACERONE) 200 MG tablet TAKE 1 TABLET BY MOUTH ONCE DAILY 90 tablet 3   ??? amLODIPine (NORVASC) 5 MG tablet Take 1 tablet (5 mg total) by mouth daily. 90 tablet 3   ??? arm brace Misc 1 each by Miscellaneous route daily. 1 each 0   ??? atorvastatin (LIPITOR) 80 MG tablet Take 1 tablet (80 mg total) by mouth daily. 90 tablet 3   ??? blood sugar diagnostic Strp USE TO CHECK BLOOD SUGAR 4 TIMES DAILY (BEFORE MEALS AND NIGHTLY) 100 each 3   ??? blood-glucose meter kit Use to check blood sugars, #1 meter, #100 strips. Freestyle glucose meter. ICD-9 250.00 1 each 0   ??? bumetanide (BUMEX) 2 MG tablet Take 1 tablet (2 mg total) by mouth daily. 90 tablet 3   ??? carvediloL (COREG) 25 MG tablet Take 1.5 tablets (37.5 mg total) by mouth Two (2) times a day. 270 tablet 3   ??? carvediloL (COREG) 3.125 MG tablet Take 1 tablet (3.125 mg total) by mouth See admin instructions. Take 1 tablet (25MG ) by mouth every morning and 2 tablets (50MG ) by mouth every night 60 tablet 0   ??? cholecalciferol, vitamin D3, 5,000 unit capsule Take 1 capsule (5,000 Units total) by mouth daily. 100 capsule 0   ??? diclofenac sodium (VOLTAREN) 1 % gel Apply 2 grams topically Three (3) times a day. 100 g 11   ??? empty container Misc Use as directed to dispose of injectable medications 1 each 3   ??? ezetimibe (ZETIA) 10 mg tablet TAKE 1 TABLET BY MOUTH ONCE DAILY 90 each 3   ??? FLUoxetine (PROZAC) 40 MG capsule Take 2 capsules (80 mg total) by mouth daily. 180 capsule 3   ??? gabapentin (NEURONTIN) 300 MG capsule Take 1 capsule (300 mg total) by mouth two (2) times a day. 180 capsule 3   ??? insulin ASPART (NOVOLOG FLEXPEN U-100 INSULIN) 100 unit/mL (3 mL) injection pen Inject 0.2 mL (20 Units total) under the skin three (3) times a day with a meal. 15 mL 11   ??? insulin detemir U-100 (LEVEMIR) 100 unit/mL (3 mL) injection pen Inject 0.4 mL (40 Units total) under the skin nightly. 15 mL 3   ??? insulin syringe-needle U-100 0.3 mL 31 gauge x 5/16 Syrg Use to inject insulin twice daily with meals 60 each 0   ??? isosorbide mononitrate (IMDUR) 30 MG 24 hr tablet Take 4 tablets (120 mg total) by mouth daily. 120 tablet 3   ??? isosorbide mononitrate (IMDUR) 30 MG  24 hr tablet Take 1 tablet (30 mg total) by mouth daily. 30 tablet 0   ??? loperamide (IMODIUM) 2 mg capsule Take 1 capsule (2 mg total) by mouth 4 (four) times a day as needed for diarrhea. 30 capsule 0   ??? nitroglycerin (NITROSTAT) 0.4 MG SL tablet Place 1 tablet (0.4 mg total) under the tongue every five (5) minutes as needed for chest pain. 100 each 3   ??? omeprazole (PRILOSEC) 40 MG capsule Take 1 capsule (40 mg total) by mouth Two (2) times a day (30 minutes before a meal). 180 capsule 3   ??? ondansetron (ZOFRAN) 4 MG tablet Take 1 tablet (4 mg total) by mouth every eight (8) hours as needed for nausea for up to 4 days. 10 tablet 0   ??? QUEtiapine (SEROQUEL) 50 MG tablet Take 1 tablet (50 mg total) by mouth two (2) times a day as needed (headache). 180 tablet 3   ??? rivaroxaban (XARELTO) 15 mg Tab Take 1 tablet (15 mg total) by mouth daily with evening meal. 90 tablet 3   ??? semaglutide 0.25 mg or 0.5 mg(2 mg/1.5 mL) PnIj Inject 0.5 mg under the skin every seven (7) days. 4.5 mL 3   ??? sevelamer (RENVELA) 800 mg tablet TAKE 2 TABLETS (1600MG ) BY MOUTH THREE TIMES DAILY WITH MEALS 540 tablet 3   ??? sodium polystyrene, SPS, with sorbitol (SPS, WITH SORBITOL,) 15-20 gram/60 mL Susp Take 60 mL (15 g) by mouth once a week. 473 mL 3   ??? ticagrelor (BRILINTA) 90 mg Tab TAKE 1 TABLET BY MOUTH TWICE DAILY 180 tablet 3   ??? UNIFINE PENTIPS 31 gauge x 5/16 Ndle USE WITH INSULIN AND VICTOZA 5 TIMES DAILY 400 each 3 ??? blood-glucose meter kit Disp. blood glucose meter kit preferred by patient's insurance. Dx: Diabetes, E11.9 1 each 11     No current facility-administered medications for this visit.        Allergies   Allergen Reactions   ??? Losartan      Hyperkalemia (K>6)   ??? Morphine Palpitations     Medication error: was administered 20mL instead of 2mL       Review of Systems  Pertinent items are noted in HPI.      Objective:      Vitals:    11/18/18 1307   BP: 150/90   Pulse: 82   Temp: 35.9 ??C (96.6 ??F)   SpO2: 98%   Body mass index is 43.14 kg/m??.    Wt Readings from Last 6 Encounters:   11/18/18 (!) 152.4 kg (336 lb)   11/17/18 (!) 152.6 kg (336 lb 6.4 oz)   10/22/18 (!) 153.3 kg (338 lb)   10/15/18 (!) 153.7 kg (338 lb 13.6 oz)   10/07/18 (!) 153.8 kg (339 lb)   09/29/18 (!) 155.7 kg (343 lb 3.2 oz)     NAD  RRR, s1s2   CTA B   Abd benign  EXT no edema  Neuro grossly normal  Psych grossly normal    Please excuse typos, dictation completed via Nurse, children's voice recognition software      _____________________________________________________________    Jeremy Anis  Oliver [facility] FSCAI  Division of Cardiology   [facility] office [phone]  Pager [phone]

## 2018-11-22 MED FILL — RENVELA 800 MG TABLET: 30 days supply | Qty: 180 | Fill #6

## 2018-11-22 MED FILL — VENTOLIN HFA 90 MCG/ACTUATION AEROSOL INHALER: 25 days supply | Qty: 18 | Fill #2 | Status: AC

## 2018-11-22 MED FILL — NOVOLOG FLEXPEN U-100 INSULIN ASPART 100 UNIT/ML (3 ML) SUBCUTANEOUS: SUBCUTANEOUS | 25 days supply | Qty: 15 | Fill #3

## 2018-11-22 MED FILL — VENTOLIN HFA 90 MCG/ACTUATION AEROSOL INHALER: RESPIRATORY_TRACT | 25 days supply | Qty: 18 | Fill #2

## 2018-11-22 MED FILL — NOVOLOG FLEXPEN U-100 INSULIN ASPART 100 UNIT/ML (3 ML) SUBCUTANEOUS: 25 days supply | Qty: 15 | Fill #3 | Status: AC

## 2018-11-22 MED FILL — RENVELA 800 MG TABLET: 30 days supply | Qty: 180 | Fill #6 | Status: AC

## 2018-11-22 MED FILL — ISOSORBIDE MONONITRATE ER 30 MG TABLET,EXTENDED RELEASE 24 HR: ORAL | 30 days supply | Qty: 120 | Fill #1

## 2018-11-22 MED FILL — ACETAMINOPHEN 500 MG TABLET: 30 days supply | Qty: 180 | Fill #0 | Status: AC

## 2018-11-22 MED FILL — ISOSORBIDE MONONITRATE ER 30 MG TABLET,EXTENDED RELEASE 24 HR: 30 days supply | Qty: 120 | Fill #1 | Status: AC

## 2018-11-23 MED ORDER — BLOOD-GLUCOSE METER KIT WRAPPER
11 refills | 0 days | Status: CP
Start: 2018-11-23 — End: 2019-11-23

## 2018-11-23 NOTE — Unmapped (Signed)
Pt calls in requesting new glucometer kit. Ordered per protocol.  Pt and wife requesting clarification of last  AVS medication lists.      Questions re:    atorvastatin 80 mg po daily    ezetimide/Zetia once daily    Brilinta     Xarelto     Coreg (25mg  tab) take 1.5 tab po bid     Coreg (3.125mg ), and take 25mg  po q am and 50mg  po q pm    Isosorbide rx dosing    Pt still has Seroquel listed as medication but was told by  provider to stop taking it d/t side effects w/ amiodarone rx.    Pt would like something else prescribed for c/o HA, states Seroquel was effective.    Will follow up

## 2018-11-24 ENCOUNTER — Ambulatory Visit: Admit: 2018-11-24 | Discharge: 2018-11-25 | Payer: MEDICARE

## 2018-11-24 DIAGNOSIS — E1143 Type 2 diabetes mellitus with diabetic autonomic (poly)neuropathy: Principal | ICD-10-CM

## 2018-11-24 DIAGNOSIS — I10 Essential (primary) hypertension: Secondary | ICD-10-CM

## 2018-11-24 DIAGNOSIS — N184 Chronic kidney disease, stage 4 (severe): Principal | ICD-10-CM

## 2018-11-24 DIAGNOSIS — K210 Gastro-esophageal reflux disease with esophagitis: Secondary | ICD-10-CM

## 2018-11-24 LAB — BASIC METABOLIC PANEL
ANION GAP: 8 mmol/L (ref 7–15)
BLOOD UREA NITROGEN: 33 mg/dL — ABNORMAL HIGH (ref 7–21)
BUN / CREAT RATIO: 9
CALCIUM: 9.1 mg/dL (ref 8.5–10.2)
CHLORIDE: 112 mmol/L — ABNORMAL HIGH (ref 98–107)
CREATININE: 3.58 mg/dL — ABNORMAL HIGH (ref 0.70–1.30)
EGFR CKD-EPI NON-AA MALE: 19 mL/min/{1.73_m2} — ABNORMAL LOW (ref >=60)
GLUCOSE RANDOM: 137 mg/dL (ref 70–179)
POTASSIUM: 5 mmol/L (ref 3.5–5.0)
SODIUM: 139 mmol/L (ref 135–145)

## 2018-11-24 LAB — POTASSIUM: Potassium:SCnc:Pt:Ser/Plas:Qn:: 5

## 2018-11-24 MED ORDER — LANCETS
3 refills | 0 days | Status: CP
Start: 2018-11-24 — End: 2019-11-24

## 2018-11-24 MED ORDER — BLOOD SUGAR DIAGNOSTIC STRIPS
3 refills | 0 days | Status: CP
Start: 2018-11-24 — End: ?

## 2018-11-24 MED ORDER — LOPERAMIDE 2 MG CAPSULE
ORAL_CAPSULE | Freq: Four times a day (QID) | ORAL | 0 refills | 8 days | Status: CP | PRN
Start: 2018-11-24 — End: ?
  Filled 2018-12-01: qty 30, 8d supply, fill #0

## 2018-11-24 NOTE — Unmapped (Signed)
Pharmacy is calling needing an RX for Lancets and ACCU-Chek  Guide Strips. Both need to have ICD10 Code and no more than 5 refills. Please give [facility] [phone] if you need to speak to them.

## 2018-11-24 NOTE — Unmapped (Signed)
Addended by: Hortencia Conradi D on: 11/24/2018 10:27 AM     Modules accepted: Orders

## 2018-11-24 NOTE — Unmapped (Signed)
Erx for testing supplies sent.

## 2018-11-24 NOTE — Unmapped (Signed)
I was the supervising physician in the delivery of the service. Cassi Jenne D Chanoch Mccleery, MD

## 2018-11-24 NOTE — Unmapped (Signed)
CCM spoke to pharmacy.  Glucometer and lancets ready for pick up and approved by insurance.    [name] calling back re: medication follow up.   Pt unsure of medication dosing.    Follow up on all medications.

## 2018-11-30 ENCOUNTER — Encounter
Admit: 2018-11-30 | Discharge: 2018-11-30 | Disposition: A | Discharge status: Home / Self Care | Payer: BLUE CROSS/BLUE SHIELD | Attending: Family

## 2018-11-30 ENCOUNTER — Ambulatory Visit
Admit: 2018-11-30 | Discharge: 2018-11-30 | Disposition: A | Discharge status: Home / Self Care | Payer: MEDICARE | Attending: Family

## 2018-11-30 DIAGNOSIS — R569 Unspecified convulsions: Secondary | ICD-10-CM

## 2018-11-30 DIAGNOSIS — G4733 Obstructive sleep apnea (adult) (pediatric): Secondary | ICD-10-CM

## 2018-11-30 DIAGNOSIS — J45909 Unspecified asthma, uncomplicated: Secondary | ICD-10-CM

## 2018-11-30 DIAGNOSIS — E785 Hyperlipidemia, unspecified: Secondary | ICD-10-CM

## 2018-11-30 DIAGNOSIS — E1122 Type 2 diabetes mellitus with diabetic chronic kidney disease: Secondary | ICD-10-CM

## 2018-11-30 DIAGNOSIS — Z885 Allergy status to narcotic agent status: Secondary | ICD-10-CM

## 2018-11-30 DIAGNOSIS — K760 Fatty (change of) liver, not elsewhere classified: Secondary | ICD-10-CM

## 2018-11-30 DIAGNOSIS — G894 Chronic pain syndrome: Secondary | ICD-10-CM

## 2018-11-30 DIAGNOSIS — Z8673 Personal history of transient ischemic attack (TIA), and cerebral infarction without residual deficits: Secondary | ICD-10-CM

## 2018-11-30 DIAGNOSIS — E669 Obesity, unspecified: Secondary | ICD-10-CM

## 2018-11-30 DIAGNOSIS — I509 Heart failure, unspecified: Secondary | ICD-10-CM

## 2018-11-30 DIAGNOSIS — M109 Gout, unspecified: Secondary | ICD-10-CM

## 2018-11-30 DIAGNOSIS — I130 Hypertensive heart and chronic kidney disease with heart failure and stage 1 through stage 4 chronic kidney disease, or unspecified chronic kidney disease: Secondary | ICD-10-CM

## 2018-11-30 DIAGNOSIS — I4891 Unspecified atrial fibrillation: Secondary | ICD-10-CM

## 2018-11-30 DIAGNOSIS — I252 Old myocardial infarction: Secondary | ICD-10-CM

## 2018-11-30 DIAGNOSIS — Z888 Allergy status to other drugs, medicaments and biological substances status: Secondary | ICD-10-CM

## 2018-11-30 DIAGNOSIS — Z79899 Other long term (current) drug therapy: Secondary | ICD-10-CM

## 2018-11-30 DIAGNOSIS — Z955 Presence of coronary angioplasty implant and graft: Secondary | ICD-10-CM

## 2018-11-30 DIAGNOSIS — Z87442 Personal history of urinary calculi: Secondary | ICD-10-CM

## 2018-11-30 DIAGNOSIS — E1121 Type 2 diabetes mellitus with diabetic nephropathy: Secondary | ICD-10-CM

## 2018-11-30 DIAGNOSIS — F410 Panic disorder [episodic paroxysmal anxiety] without agoraphobia: Secondary | ICD-10-CM

## 2018-11-30 DIAGNOSIS — F329 Major depressive disorder, single episode, unspecified: Secondary | ICD-10-CM

## 2018-11-30 DIAGNOSIS — G8911 Acute pain due to trauma: Secondary | ICD-10-CM

## 2018-11-30 DIAGNOSIS — F17220 Nicotine dependence, chewing tobacco, uncomplicated: Secondary | ICD-10-CM

## 2018-11-30 DIAGNOSIS — I2510 Atherosclerotic heart disease of native coronary artery without angina pectoris: Secondary | ICD-10-CM

## 2018-11-30 DIAGNOSIS — N183 Chronic kidney disease, stage 3 (moderate): Secondary | ICD-10-CM

## 2018-11-30 DIAGNOSIS — Z950 Presence of cardiac pacemaker: Secondary | ICD-10-CM

## 2018-11-30 DIAGNOSIS — I4892 Unspecified atrial flutter: Secondary | ICD-10-CM

## 2018-11-30 DIAGNOSIS — M25511 Pain in right shoulder: Secondary | ICD-10-CM

## 2018-11-30 DIAGNOSIS — M1990 Unspecified osteoarthritis, unspecified site: Secondary | ICD-10-CM

## 2018-11-30 DIAGNOSIS — Z590 Homelessness: Secondary | ICD-10-CM

## 2018-11-30 DIAGNOSIS — K219 Gastro-esophageal reflux disease without esophagitis: Secondary | ICD-10-CM

## 2018-11-30 MED ORDER — TRAMADOL 50 MG TABLET
ORAL_TABLET | Freq: Four times a day (QID) | ORAL | 0 refills | 4.00000 days | Status: CP | PRN
Start: 2018-11-30 — End: 2018-12-14
  Filled 2018-12-10: qty 15, 4d supply, fill #0

## 2018-11-30 NOTE — Unmapped (Signed)
cancel

## 2018-11-30 NOTE — Unmapped (Signed)
Pt. Via wheelchair to triage with c/o right shoulder pain after roughhousing with children

## 2018-11-30 NOTE — Unmapped (Signed)
[facility] [address] Clinical Assessment & Refill Coordination Note    Jeremy Oliver, DOB: [birth year]  Phone: [phone] (home)     All above HIPAA information was verified with patient.     Specialty Medication(s):   General Specialty: Praluent     Current Outpatient Medications   Medication Sig Dispense Refill   ??? acetaminophen (TYLENOL) 500 MG tablet TAKE 2 TABLETS (1 000MG ) BY MOUTH EVERY 8 HOURS AS NEEDED FOR PAIN 180 each 3   ??? albuterol HFA 90 mcg/actuation inhaler Inhale 2 puffs into the lungs every 6 (six) hours as needed for wheezing or shortness of breath. 18 g 2   ??? alirocumab (PRALUENT) 150 mg/mL subcutaneous injection Inject 1 mL (150 mg total) under the skin every fourteen (14) days. 4 Syringe 11   ??? allopurinoL (ZYLOPRIM) 100 MG tablet Take 0.5 tablets (50 mg total) by mouth daily. 45 tablet 3   ??? amiodarone (PACERONE) 200 MG tablet TAKE 1 TABLET BY MOUTH ONCE DAILY 90 tablet 3   ??? amLODIPine (NORVASC) 5 MG tablet Take 1 tablet (5 mg total) by mouth daily. 90 tablet 3   ??? arm brace Misc 1 each by Miscellaneous route daily. 1 each 0   ??? atorvastatin (LIPITOR) 80 MG tablet Take 1 tablet (80 mg total) by mouth daily. 90 tablet 3   ??? blood sugar diagnostic Strp Disp to match current ins approved meter. Use to test blood sugar four times daily. E11.22, Z79.4 400 each 3   ??? blood-glucose meter kit Disp. blood glucose meter kit preferred by patient's insurance. Dx: Diabetes, E11.9 1 each 11   ??? bumetanide (BUMEX) 2 MG tablet Take 1 tablet (2 mg total) by mouth daily. 90 tablet 3   ??? carvediloL (COREG) 25 MG tablet Take 1.5 tablets (37.5 mg total) by mouth Two (2) times a day. 270 tablet 3   ??? cholecalciferol, vitamin D3, 5,000 unit capsule Take 1 capsule (5,000 Units total) by mouth daily. 100 capsule 0   ??? diclofenac sodium (VOLTAREN) 1 % gel Apply 2 grams topically Three (3) times a day. 100 g 11   ??? empty container Misc Use as directed to dispose of injectable medications 1 each 3   ??? ezetimibe (ZETIA) 10 mg tablet TAKE 1 TABLET BY MOUTH ONCE DAILY 90 each 3   ??? FLUoxetine (PROZAC) 40 MG capsule Take 2 capsules (80 mg total) by mouth daily. 180 capsule 3   ??? gabapentin (NEURONTIN) 300 MG capsule Take 1 capsule (300 mg total) by mouth two (2) times a day. 180 capsule 3   ??? insulin ASPART (NOVOLOG FLEXPEN U-100 INSULIN) 100 unit/mL (3 mL) injection pen Inject 0.2 mL (20 Units total) under the skin three (3) times a day with a meal. 15 mL 11   ??? insulin detemir U-100 (LEVEMIR) 100 unit/mL (3 mL) injection pen Inject 0.4 mL (40 Units total) under the skin nightly. 15 mL 3   ??? insulin syringe-needle U-100 0.3 mL 31 gauge x 5/16 Syrg Use to inject insulin twice daily with meals 60 each 0   ??? isosorbide mononitrate (IMDUR) 30 MG 24 hr tablet Take 4 tablets (120 mg total) by mouth daily. 120 tablet 3   ??? isosorbide mononitrate (IMDUR) 30 MG 24 hr tablet Take 1 tablet (30 mg total) by mouth daily. 30 tablet 0   ??? lancets Misc Disp. Ins preferred lancets. Use to test blood sugar four times daily. E11.22, Z79.4 400 each 3   ???  loperamide (IMODIUM) 2 mg capsule Take 1 capsule (2 mg total) by mouth 4 (four) times a day as needed for diarrhea. 30 capsule 0   ??? nitroglycerin (NITROSTAT) 0.4 MG SL tablet Place 1 tablet (0.4 mg total) under the tongue every five (5) minutes as needed for chest pain. 100 each 3   ??? omeprazole (PRILOSEC) 40 MG capsule Take 1 capsule (40 mg total) by mouth Two (2) times a day (30 minutes before a meal). 180 capsule 3   ??? ondansetron (ZOFRAN) 4 MG tablet Take 1 tablet (4 mg total) by mouth every eight (8) hours as needed for nausea for up to 4 days. 10 tablet 0   ??? rivaroxaban (XARELTO) 15 mg Tab Take 1 tablet (15 mg total) by mouth daily with evening meal. 90 tablet 3   ??? semaglutide 0.25 mg or 0.5 mg(2 mg/1.5 mL) PnIj Inject 0.5 mg under the skin every seven (7) days. 4.5 mL 3   ??? sevelamer (RENVELA) 800 mg tablet TAKE 2 TABLETS (1600MG ) BY MOUTH THREE TIMES DAILY WITH MEALS 540 tablet 3   ??? sodium polystyrene, SPS, with sorbitol (SPS, WITH SORBITOL,) 15-20 gram/60 mL Susp Take 60 mL (15 g) by mouth once a week. 473 mL 3   ??? ticagrelor (BRILINTA) 90 mg Tab TAKE 1 TABLET BY MOUTH TWICE DAILY 180 tablet 3   ??? UNIFINE PENTIPS 31 gauge x 5/16 Ndle USE WITH INSULIN AND VICTOZA 5 TIMES DAILY 400 each 3     No current facility-administered medications for this visit.         Changes to medications: [name] reports a decrease in the dose of his blood pressure medication but was unable to tell me which one    Allergies   Allergen Reactions   ??? Losartan      Hyperkalemia (K>6)   ??? Morphine Palpitations     Medication error: was administered 20mL instead of 2mL       Changes to allergies: No    SPECIALTY MEDICATION ADHERENCE     Praluent 150 mg/ml: 14 days of medicine on hand       Medication Adherence    Patient reported X missed doses in the last month: 0  Specialty Medication: Praluent  Patient is on additional specialty medications: No          Specialty medication(s) dose(s) confirmed: Regimen is correct and unchanged.     Are there any concerns with adherence? No    Adherence counseling provided? Not needed    CLINICAL MANAGEMENT AND INTERVENTION      Clinical Benefit Assessment:    Do you feel the medicine is effective or helping your condition? Yes    Clinical Benefit counseling provided? Not needed    Adverse Effects Assessment:    Are you experiencing any side effects? No    Are you experiencing difficulty administering your medicine? No    Quality of Life Assessment:    How many days over the past month did your hyperlipidemia  keep you from your normal activities? For example, brushing your teeth or getting up in the morning. 0    Have you discussed this with your provider? Not needed    Therapy Appropriateness:    Is therapy appropriate? Yes, therapy is appropriate and should be continued    DISEASE/MEDICATION-SPECIFIC INFORMATION      For patients on injectable medications: Patient currently has 1 doses left.  Next injection is scheduled for this weekend.    PATIENT  SPECIFIC NEEDS     ? Does the patient have any physical, cognitive, or cultural barriers? No    ? Is the patient high risk? No     ? Does the patient require a Care Management Plan? No     ? Does the patient require physician intervention or other additional services (i.e. nutrition, smoking cessation, social work)? No      SHIPPING     Specialty Medication(s) to be Shipped:   General Specialty: Praluent    Other medication(s) to be shipped: none     Changes to insurance: No    Delivery Scheduled: Yes, Expected medication delivery date: 12/07/18.     Medication will be delivered via [facility] outpatient pharmacy clinic to the confirmed prescription address in [place].    The patient will receive a drug information handout for each medication shipped and additional FDA Medication Guides as required.  Verified that patient has previously received a Conservation officer, historic buildings.    All of the patient's questions and concerns have been addressed.    Arnold Long   [facility] Specialty Pharmacist

## 2018-12-01 DIAGNOSIS — I2510 Atherosclerotic heart disease of native coronary artery without angina pectoris: Secondary | ICD-10-CM

## 2018-12-01 MED FILL — LOPERAMIDE 2 MG CAPSULE: 8 days supply | Qty: 30 | Fill #0 | Status: AC

## 2018-12-01 MED FILL — LEVEMIR FLEXTOUCH U-100 INSULIN 100 UNIT/ML (3 ML) SUBCUTANEOUS PEN: 37 days supply | Qty: 15 | Fill #3 | Status: AC

## 2018-12-01 MED FILL — DICLOFENAC 1 % TOPICAL GEL: 17 days supply | Qty: 100 | Fill #6 | Status: AC

## 2018-12-01 MED FILL — DICLOFENAC 1 % TOPICAL GEL: TOPICAL | 17 days supply | Qty: 100 | Fill #6

## 2018-12-01 MED FILL — UNIFINE PENTIPS 31 GAUGE X 5/16" NEEDLE: 80 days supply | Qty: 400 | Fill #2 | Status: AC

## 2018-12-01 MED FILL — UNIFINE PENTIPS 31 GAUGE X 5/16" NEEDLE (8 MM): 80 days supply | Qty: 400 | Fill #2

## 2018-12-01 MED FILL — LEVEMIR FLEXTOUCH U-100 INSULIN 100 UNIT/ML (3 ML) SUBCUTANEOUS PEN: SUBCUTANEOUS | 37 days supply | Qty: 15 | Fill #3

## 2018-12-01 NOTE — Unmapped (Signed)
Patient calling in to schedule a face to face visitI spoke to patient and they are requesting to be seen in a face-to-face visit. Please call patient to determine whether they should be scheduled for face-to-face visit.    Best callback day/time: anytime  Best callback number: [phone]    Please let me know if there is anything I can do to help.     Sent Dr Hulan Fess message through chat.    Wife very rude and upset , I offered virtual appointment , she declined indicated she is sick of front desk . Know his health history and offering virtual/video appointments. Wants a call back when we get answer from Dr Hulan Fess about face to face appointment .

## 2018-12-01 NOTE — Unmapped (Signed)
CCM Follow-up Call    PCP Action:pt requesting appt w/ you this week if possible.      CCM Action:f/u pt's wife Caregiver/treatment plan assistance- allowed in clinic room for visit?    Patient Action:attend appts, bring all meds      Follow up/Discuss next call: DM, HF, pain mngt. SDOH       [name] called re: f/u appt w/ Dr. Hulan Fess needed. [name] was confused and thought appt was today 9/2,however was yesterday 11/30/18.    Pt was in the ED for r. Shoulder pain. Says picking up ultram rx prescribed yesterday for the pain continues @  6-7/10.    Pt also states actively looking at finding a place to live w; just wife. Current situation w/ family and kids has become stressful. [name] mentioned DSS [employer] be coming there to discuss the living situation.  Pt believes neighbors will be evicted soon and they will be able to move into that home.    Pt also states extrememly short of breath when ambulating and requesting to be tested for pa02 sats w/ exertion. Will discuss w/ provider. Denies need for [facility]. Insists on seeing Dr. Hulan Fess.     CCM unable to schedule appt w/ Dr. Hulan Fess this month, will follow up w/ scheduling.    Spoke to pt states:  FBG ranging in the 120s  BP goes up to   260/150- when goes outside in the heat, ambulating   States BP is Always  sky high after I go outside.  Pt states after resting about 15 minutes goes down  120/70-80  Pt can feel the afib when goes out walking.  Pt states cardiologist is aware, pt needs more stents but needs to start dialysis first.    Pt also states wants to go hunting this weekened, has New nephrology appt 12/16/18 - w/ Dr. Gwenlyn Found w/ pt for an extra 15 minutes regarding his request to have his wife, [name] with him during his provider appt/visit.  Pt states she, [name],  is his caregiver, she drives him, and is completely in charge of his medications.   Pt states he  is unable to comprehend changes made to meds and feels does have some short term memory loss, especially memory  about any of his medications.  Pt requests for half the medical appt to be seen alone with  His provider and for second half of visit his wife to be included- to review/explain any changes re: medications and tx plan.    Will follow up w/ provider

## 2018-12-01 NOTE — Unmapped (Signed)
Pt was scheduled for f42f visit yesterday.  Asking to reschedule.  Will notify scheduling.

## 2018-12-01 NOTE — Unmapped (Signed)
[facility]  Emergency Department Provider Note          ED Clinical Impression      Final diagnoses:   Acute pain of right shoulder (Primary)           Impression, ED Course, Assessment and Plan      Impression: Patient is a 48 y.o. male with a h/o asthma, a-fib and flutter (currently on xarelto), CHF (EF 40-45% on 06/02/2018), STEMI in 2018 with VT arrest (s/p ICD), CAD s/p multiple stent placement, CKD stage III, chronic pain syndrome, DM, GERD, HLD, HTN, nephropathy, OSA, TIA, seizure-like activity, and homelessness presenting with right shoulder pain in the setting of his 115lb kid jumping on right shoulder while patient was laying on the ground 2 days ago.    VS are WNL. On exam, patient appears well and in NAD. Diffuse tenderness to posterior shoulder. Able to rotate shoulder with some Patient able to rotate and move shoulder. Able to touch opposite shoulder with his hand. Neurovascularly intact.     Triage XR right shoulder shows chronic tendinopathy, but no acute fracture or dislocation. Suspect shoulder sprain, rotator cuff strain vs tear. We have discussed ROM exercises and tylenol around the clock. Will also provide short course of Ultram for breakthrough relief and help with sleeping. Instructed to call orthopedic clinic for follow up in the next week. Patient is agreeable with the plan. Return precautions discussed.       Additional Medical Decision Making and Disclaimers     Jeremy Route Breunig was evaluated in Emergency Department at the time of this visit for the symptoms described in the history of present illness. He was evaluated in the context of the global COVID-19 pandemic, which necessitated consideration that the patient might be at risk for infection with the SARS-CoV-2 virus that causes COVID-19. Institutional protocols and algorithms that pertain to the evaluation of patients at risk for COVID-19 were followed during the patient's care in the ED.    I have reviewed the vital signs and the nursing notes. Labs and radiology results that were available during my care of the patient were independently reviewed by me and considered in my medical decision making.   I independently visualized the radiology images.   I reviewed the patient's prior medical records.     Portions of this record have been created using Scientist, clinical (histocompatibility and immunogenetics). Dictation errors have been sought, but Danker not have been identified and corrected.  ____________________________________________         History        Chief Complaint  Shoulder Pain      HPI   Jeremy Oliver is a 48 y.o. male with a h/o asthma, a-fib and flutter (currently on xarelto), CHF (EF 40-45% on 06/02/2018), STEMI in 2018 with VT arrest (s/p ICD), CAD s/p multiple stent placement, CKD stage III, chronic pain syndrome, DM, GERD, HLD, HTN, nephropathy, OSA, TIA, seizure-like activity, and homelessness presenting with right shoulder pain in the setting of his 115lb kid jumping on right shoulder while patient was laying on the ground 2 days ago. After this incident, the patient felt like his shoulder was out of place, so the patient reports that he held a 5 gallon bucket of gravel and then dropped it, and he reports feeling a large pop at that time. He endorses worsening pain this morning, states that he was unable move it after waking up. He has tried ice and tylenol, last does of Tylenol  was this morning, provides minimal to no relief. Patient has hx of CKD, does not take NSAIDs. The patient denies any fevers, chills, new chest pain, cough, shortness of breath, muscle aches, nausea, vomiting, diarrhea, loss of smell, recent out-of-state travel in the past 1 month, sick contacts or contacts with confirmed Covid-19.       Past Medical History:   Diagnosis Date   ??? Angina at rest (CMS-[name])    ??? Arthritis    ??? Asthma    ??? Atrial fibrillation and flutter (CMS-[name]) 06/08/14   ??? BMI 40.0-44.9, adult (CMS-[name]) 10/29/2015   ??? Chronic anticoagulation 02/17/2018   ??? Chronic kidney disease, stage 3 (CMS-[name])    ??? Chronic pain syndrome 04/11/2014    ~Use daily lyrica as recommended ~Tramadol as needed for pain ~Fluoxetine daily as recommended ~Daily exercise ~Eat a healthy diet ~Good control of your blood sugars can help    ??? Coronary artery disease    ??? Depression    ??? Diabetes mellitus (CMS-[name])    ??? Eye trauma 1998    glass in both eyes and removed   ??? GERD (gastroesophageal reflux disease)    ??? Gout    ??? Homeless    ??? Hyperlipidemia    ??? Hypertension    ??? Illiteracy and low-level literacy    ??? Microscopic hematuria    ??? Migraine    ??? Nephrolithiasis    ??? 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 Nonalcoholic fatty liver disease    ??? NSTEMI (non-ST elevated myocardial infarction) (CMS-[name]) 01/13/2017   ??? Obesity    ??? Obstructive sleep apnea 2009   ??? Panic attacks    ??? 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 Seizure-like activity (CMS-[name])     ~Monitor for symptoms and report them to your primary care provider if they occur    ??? Systolic CHF, chronic (CMS-[name])     EF 40-45% 2017   ??? TIA (transient ischemic attack)        Patient Active Problem List   Diagnosis   ??? CAD (coronary artery disease) (RAF-[name])   ??? Idiopathic chronic gout of left knee   ??? Hypercholesteremia   ??? Essential hypertension (RAF-[name])   ??? 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 Osteoarthritis   ??? Chronic nonalcoholic liver disease   ??? GERD (gastroesophageal reflux disease)   ??? Mild intermittent asthma without complication   ??? Chronic pain syndrome   ??? Paroxysmal atrial fibrillation (CMS-[name])   ??? OSA (obstructive sleep apnea)   ??? Tobacco use disorder   ??? Status post cardiac pacemaker procedure   ??? Diabetes mellitus with gastroparesis (CMS-[name])   ??? Cardiac pacemaker in situ   ??? Recurrent major depressive disorder, in full remission (CMS-[name])   ??? NSTEMI (non-ST elevated myocardial infarction) (CMS-[name])   ??? Adjustment disorder, unspecified   ??? Chronic heart failure with reduced ejection fraction and diastolic dysfunction (CMS-[name])   ??? Hyperkalemia   ??? CKD (chronic kidney disease) stage 4, GFR  15-29 ml/min (CMS-[name])   ??? Acquired left foot drop   ??? Decreased sensation of lower extremity, left   ??? Chronic anticoagulation   ??? Type 2 diabetes mellitus with stage 4 chronic kidney disease, with long-term current use of insulin (CMS-[name])   ??? Situational stress       Past Surgical History:   Procedure Laterality Date   ??? CARDIAC CATHETERIZATION  03/08/2007   ??? CYSTOURETHROSCOPY  12/31/2009     Microscopic hematuria   ??? KNEE SURGERY Right 09/23/2006    arthroscopic knee surgery medial and lateral meniscus tears as well as crystal disease   ??? PR CATH PLACE/CORON ANGIO, IMG SUPER/INTERP,R&L HRT CATH, L HRT VENTRIC N/A 02/13/2017    Procedure: Left/Right Heart Catheterization;  Surgeon: Marlaine Hind, MD;  Location: [facility] CATH;  Service: Cardiology   ??? PR CATH PLACE/CORON ANGIO, IMG SUPER/INTERP,W LEFT HEART VENTRICULOGRAPHY N/A 03/17/2017    Procedure: Left Heart Catheterization W Intervention;  Surgeon: Marlaine Hind, MD;  Location: [facility] CATH;  Service: Cardiology   ??? PR CATH PLACE/CORON ANGIO, IMG SUPER/INTERP,W LEFT HEART VENTRICULOGRAPHY N/A 05/12/2018    Procedure: CATH LEFT HEART CATHETERIZATION W INTERVENTION;  Surgeon: Dorathy Kinsman, MD;  Location: [facility] CATH;  Service: Cardiology   ??? PR CATH PLACE/CORON ANGIO, IMG SUPER/INTERP,W LEFT HEART VENTRICULOGRAPHY N/A 10/22/2018    Procedure: CATH LEFT HEART CATHETERIZATION W INTERVENTION;  Surgeon: Marlaine Hind, MD;  Location: [facility] CATH;  Service: Cardiology   ??? PR ECMO/ECLS INITIATION VENO-ARTERIAL N/A 03/19/2017    Procedure: ECMO/ECLS; INITIATION, VENO-ARTERIAL;  Surgeon: Lennie Odor, MD;  Location: MAIN OR [facility];  Service: Cardiac Surgery   ??? PR ECMO/ECLS RMVL PRPH CANNULA OPEN 6 YRS & OLDER Left 03/25/2017    Procedure: ECMO / ECLS PROVIDED BY PHYSICIAN; REMOVAL OF PERIPHERAL (ARTERIAL AND/OR VENOUS) CANNULA(E), OPEN, 6 YEARS AND OLDER;  Surgeon: Alonna Buckler Ikonomidis, MD;  Location: MAIN OR [facility];  Service: Cardiac Surgery   ??? PR EPHYS EVAL W/ ABLATION SUPRAVENT ARRHYTHMIA N/A 07/19/2015    Catheter ablation of cavotricuspid isthmus for atrial flutter [facility])   ??? PR INSER HART PACER XVENOUS ATRIAL N/A 05/30/2015    [employer] dual-chamber pacemaker implant (E.Chung)   ??? PR INSERT/PLACE FLOW DIRECT CATH N/A 03/17/2017    Procedure: Insert Leave In [place];  Surgeon: Marlaine Hind, MD;  Location: [facility] CATH;  Service: Cardiology   ??? PR INSJ/RPLCMT PERM DFB W/TRNSVNS LDS 1/DUAL CHMBR N/A 05/04/2017    Procedure: ICD Implant System (Single/Dual);  Surgeon: Eldred Manges, MD;  Location: [facility] CATH;  Service: Cardiology   ??? PR NEGATIVE PRESSURE WOUND THERAPY DME >50 SQ CM Left 03/25/2017    Procedure: Neg Press Wound Tx (Vac Assist) Incl Topicals, Per Session, Tsa Greater Than/= 50 Cm Squared;  Surgeon: Alonna Buckler Ikonomidis, MD;  Location: MAIN OR [facility];  Service: Cardiac Surgery   ??? PR PRQ TRLUML CORONARY STENT W/ANGIO ONE ART/BRNCH N/A 03/12/2018    Procedure: Percutaneous Coronary Intervention;  Surgeon: Marlaine Hind, MD;  Location: [facility] CATH;  Service: Cardiology   ??? PR REBL VES DIRECT,LOW EXTREM Left 03/19/2017    Procedure: Repr Bld Vessel Direct; Lower Extrem;  Surgeon: Boykin Reaper, MD;  Location: MAIN OR [facility];  Service: Vascular   ??? PR REMV ART CLOT ILIAC-POP,LEG INCIS Left 03/25/2017    Procedure: EMBOLECTOMY OR THROMBECTOMY, WITH OR WITHOUT CATHETER; FEMOROPOPLITEAL, AORTOILIAC ARTERY, BY LEG INCISION;  Surgeon: Loletta Specter, MD;  Location: MA[facility] OR [facility];  Service: Cardiac  Surgery   ??? PR UPPER GI ENDOSCOPY,BIOPSY N/A 02/28/2016    Procedure: UGI ENDOSCOPY; WITH BIOPSY, SINGLE OR MULTIPLE;  Surgeon: Liane Comber, MD;  [facility];  Service: Gastroenterology   ??? PR UPPER GI ENDOSCOPY,DIAGNOSIS N/A 05/07/2016    Procedure: UGI ENDO, INCLUDE ESOPHAGUS, STOMACH, & DUODENUM &/OR JEJUNUM; DX W/WO COLLECTION SPECIMN, BY BRUSH OR WASH;  Surgeon: Modena Nunnery, MD;  [facility]: [facility];  Service: Gastroenterology       No current facility-administered medications for this encounter.     Current Outpatient Medications:   ???  acetaminophen (TYLENOL) 500 MG tablet, TAKE 2 TABLETS (1 000MG ) BY MOUTH EVERY 8 HOURS AS NEEDED FOR PAIN, Disp: 180 each, Rfl: 3  ???  albuterol HFA 90 mcg/actuation inhaler, Inhale 2 puffs into the lungs every 6 (six) hours as needed for wheezing or shortness of breath., Disp: 18 g, Rfl: 2  ???  alirocumab (PRALUENT) 150 mg/mL subcutaneous injection, Inject 1 mL (150 mg total) under the skin every fourteen (14) days., Disp: 4 Syringe, Rfl: 11  ???  allopurinoL (ZYLOPRIM) 100 MG tablet, Take 0.5 tablets (50 mg total) by mouth daily., Disp: 45 tablet, Rfl: 3  ???  amiodarone (PACERONE) 200 MG tablet, TAKE 1 TABLET BY MOUTH ONCE DAILY, Disp: 90 tablet, Rfl: 3  ???  amLODIPine (NORVASC) 5 MG tablet, Take 1 tablet (5 mg total) by mouth daily., Disp: 90 tablet, Rfl: 3  ???  arm brace Misc, 1 each by Miscellaneous route daily., Disp: 1 each, Rfl: 0  ???  atorvastatin (LIPITOR) 80 MG tablet, Take 1 tablet (80 mg total) by mouth daily., Disp: 90 tablet, Rfl: 3  ???  blood sugar diagnostic Strp, Disp to match current ins approved meter. Use to test blood sugar four times daily. E11.22, Z79.4, Disp: 400 each, Rfl: 3  ???  blood-glucose meter kit, Disp. blood glucose meter kit preferred by patient's insurance. Dx: Diabetes, E11.9, Disp: 1 each, Rfl: 11  ???  bumetanide (BUMEX) 2 MG tablet, Take 1 tablet (2 mg total) by mouth daily., Disp: 90 tablet, Rfl: 3  ???  carvediloL (COREG) 25 MG tablet, Take 1.5 tablets (37.5 mg total) by mouth Two (2) times a day., Disp: 270 tablet, Rfl: 3  ???  cholecalciferol, vitamin D3, 5,000 unit capsule, Take 1 capsule (5,000 Units total) by mouth daily., Disp: 100 capsule, Rfl: 0  ???  diclofenac sodium (VOLTAREN) 1 % gel, Apply 2 grams topically Three (3) times a day., Disp: 100 g, Rfl: 11  ???  empty container Misc, Use as directed to dispose of injectable medications, Disp: 1 each, Rfl: 3  ???  ezetimibe (ZETIA) 10 mg tablet, TAKE 1 TABLET BY MOUTH ONCE DAILY, Disp: 90 each, Rfl: 3  ???  FLUoxetine (PROZAC) 40 MG capsule, Take 2 capsules (80 mg total) by mouth daily., Disp: 180 capsule, Rfl: 3  ???  gabapentin (NEURONTIN) 300 MG capsule, Take 1 capsule (300 mg total) by mouth two (2) times a day., Disp: 180 capsule, Rfl: 3  ???  insulin ASPART (NOVOLOG FLEXPEN U-100 INSULIN) 100 unit/mL (3 mL) injection pen, Inject 0.2 mL (20 Units total) under the skin three (3) times a day with a meal., Disp: 15 mL, Rfl: 11  ???  insulin detemir U-100 (LEVEMIR) 100 unit/mL (3 mL) injection pen, Inject 0.4 mL (40 Units total) under the skin nightly., Disp: 15 mL, Rfl: 3  ???  insulin syringe-needle U-100 0.3 mL 31 gauge x 5/16 Syrg,  Use to inject insulin twice daily with meals, Disp: 60 each, Rfl: 0  ???  isosorbide mononitrate (IMDUR) 30 MG 24 hr tablet, Take 4 tablets (120 mg total) by mouth daily., Disp: 120 tablet, Rfl: 3  ???  isosorbide mononitrate (IMDUR) 30 MG 24 hr tablet, Take 1 tablet (30 mg total) by mouth daily., Disp: 30 tablet, Rfl: 0  ???  lancets Misc, Disp. Ins preferred lancets. Use to test blood sugar four times daily. E11.22, Z79.4, Disp: 400 each, Rfl: 3  ???  loperamide (IMODIUM) 2 mg capsule, Take 1 capsule (2 mg total) by mouth 4 (four) times a day as needed for diarrhea., Disp: 30 capsule, Rfl: 0  ???  nitroglycerin (NITROSTAT) 0.4 MG SL tablet, Place 1 tablet (0.4 mg total) under the tongue every five (5) minutes as needed for chest pain., Disp: 100 each, Rfl: 3  ???  omeprazole (PRILOSEC) 40 MG capsule, Take 1 capsule (40 mg total) by mouth Two (2) times a day (30 minutes before a meal)., Disp: 180 capsule, Rfl: 3  ???  ondansetron (ZOFRAN) 4 MG tablet, Take 1 tablet (4 mg total) by mouth every eight (8) hours as needed for nausea for up to 4 days., Disp: 10 tablet, Rfl: 0  ???  rivaroxaban (XARELTO) 15 mg Tab, Take 1 tablet (15 mg total) by mouth daily with evening meal., Disp: 90 tablet, Rfl: 3  ???  semaglutide 0.25 mg or 0.5 mg(2 mg/1.5 mL) PnIj, Inject 0.5 mg under the skin every seven (7) days., Disp: 4.5 mL, Rfl: 3  ???  sevelamer (RENVELA) 800 mg tablet, TAKE 2 TABLETS (1600MG ) BY MOUTH THREE TIMES DAILY WITH MEALS, Disp: 540 tablet, Rfl: 3  ???  sodium polystyrene, SPS, with sorbitol (SPS, WITH SORBITOL,) 15-20 gram/60 mL Susp, Take 60 mL (15 g) by mouth once a week., Disp: 473 mL, Rfl: 3  ???  ticagrelor (BRILINTA) 90 mg Tab, TAKE 1 TABLET BY MOUTH TWICE DAILY, Disp: 180 tablet, Rfl: 3  ???  traMADoL (ULTRAM) 50 mg tablet, Take 1 tablet (50 mg total) by mouth every six (6) hours as needed for pain for up to 5 days., Disp: 15 tablet, Rfl: 0  ???  UNIFINE PENTIPS 31 gauge x 5/16 Ndle, USE WITH INSULIN AND VICTOZA 5 TIMES DAILY, Disp: 400 each, Rfl: 3    Allergies  Losartan and Morphine    Family History   Problem Relation Age of Onset   ??? Diabetes Mother    ??? Hyperlipidemia Mother    ??? Heart disease Mother    ??? Basal cell carcinoma Mother    ??? Blindness Mother         diabeties   ??? Obesity Mother    ??? Hyperlipidemia Father    ??? Heart disease Father    ??? Lung disease Father    ??? Heart disease Half-Sister    ??? Kidney disease Half-Sister    ??? Gallbladder disease Half-Brother    ??? Obesity Half-Brother    ??? No Known Problems Daughter    ??? No Known Problems Son    ??? Obesity Daughter    ??? Melanoma Neg Hx    ??? Squamous cell carcinoma Neg Hx    ??? Macular degeneration Neg Hx    ??? Glaucoma Neg Hx        Social History  Social History     Tobacco Use   ??? Smoking status: Never Smoker   ??? Smokeless tobacco: Current User     Types:  Chew   ??? Tobacco comment: Pt in process of reducing tobacco use,  dips 1 can every 2 weeks,   Substance Use Topics   ??? Alcohol use: Never     Alcohol/week: 0.0 standard drinks     Frequency: Never     Comment: rare use: couple times a year, small amount   ??? Drug use: No       Review of Systems   Constitutional: Negative for fever.  Eyes: Negative for visual changes.  ENT: Negative for sore throat.  Cardiovascular: Negative for chest pain.  Respiratory: Negative for shortness of breath or cough.  Gastrointestinal: Negative for abdominal pain, vomiting or diarrhea.  Genitourinary: Negative for dysuria.   Musculoskeletal: + right shoulder pain. Negative for back pain.  Skin: Negative for rash.  Neurological: Negative for headaches, focal weakness or numbness.       Physical Exam     This provider entered the patient's room: Yes:    ? If this provider did not enter the room, a comprehensive physical exam was not able to be performed due to increased infection risk to themselves, other providers, staff and other patients), as well as to conserve personal protective equipment (PPE) utilization during the COVID-19 pandemic.    ? If this provider did enter the patient room, the following was PPE worn: Surgical mask, eye protection and gloves      ED Triage Vitals [11/30/18 1541]   Enc Vitals Group      BP 176/106      Heart Rate 102      SpO2 Pulse       Resp 20      Temp 37.4 ??C (99.3 ??F)      Temp Source Oral      SpO2 98 %       Constitutional: Alert and oriented. Well appearing and in no distress.  Eyes: Conjunctivae are normal.  ENT       Head: Normocephalic and atraumatic.       Nose: No rhinorrhea.       Mouth/Throat: Mucous membranes appear moist.       Neck: No audible stridor.  Cardiovascular: Normal rate, regular rhythm. Normal and symmetric distal pulses are present in all extremities.   Respiratory: Normal respiratory pattern and effort. No audible wheezing or other abnormal breath sounds.   Gastrointestinal: Soft and nontender.   Musculoskeletal: Diffuse tenderness to posterior shoulder. Able to rotate shoulder with some Patient able to rotate and move shoulder. Able to touch opposite shoulder with his hand.   Neurologic: Distal sensation and motor function intact. Normal speech and language. No gross focal neurologic deficits are appreciated.  Skin: Skin appears warm and dry. No rash visible.  Psychiatric: Mood and affect are normal. Speech and behavior are normal.       Radiology     Right shoulder xray:    FINDINGS:   Axillary view is suboptimal.  No acute fractures.. Joints are approximated. Joints spaces are preserved. Mild cortical irregularity along the greater tuberosity which can be seen with chronic rotator cuff tendinopathy. No focal soft tissue swelling. No radiopaque foreign bodies or dissecting soft tissue gas. Partially imaged lungs are clear.  ??  IMPRESS.ION:  No acute osseous abnormalities.  ??  Findings suggestive of chronic rotator cuff tendinopathy.    Documentation assistance was provided by Levin Erp, Scribe, on November 30, 2018 at 5:10 PM for Cheri Rous, NP.    Documentation assistance was provided by the scribe  in my presence.  The documentation recorded by the scribe has been reviewed by me and accurately reflects the services I personally performed.     Junious Dresser [place], [place]  11/30/18 1740

## 2018-12-01 NOTE — Unmapped (Signed)
Care Management  Initial Transition Planning Assessment    Patient has been identified as a Runner, broadcasting/film/video.   Longitudinal Plan of Care reviewed, the following information is noted for continuity of care:  Pt lives with wife. Used to live with their daughter and her family in [place].     Home Safety Screening  Patient Questions:    1.  Before the illness or injury that brought you to the ED, did you need someone to help you on a regular basis? Do you need people to help you with activities of daily living?  ?? Yes - his wife helps him out  2.  In the last 24 hours, have you needed more help than usual?  ?? Yes - shoulder pain  3.  Have you been hospitalized for one or more nights during the past six months?   ?? Yes - per chart  4.  Have you been to an emergency room one or more times in the past six months?  ?? Yes - per chart  5.  In general, do you have problems with your vision that cannot be corrected with glasses/contacts?  ?? No  6.  In general, do you have problems with your memory?  ?? No  7.  Do you take six or more different medications every day?   ?? Yes - per chart  8. Do you have any trouble getting, taking, keeping up with or paying for your medications?   ?? No  9   Have you had any falls over the past few weeks or are you concerned about falling?  ?? Yes -  incident of falls  10.  Are you having trouble accessing and/or preparing the meals you need?  ?? No  11. Do you have wounds or bed ulcers?  ?? No  12  Do you have access to transportation?    Yes - wife can drive him    Care Manager Questions:  Is the patient over the age of 37?  ?? No  *Home Safety Evaluation endorsed  6 of the screening items and will update personal health advocate. Personal Health Advocate Pool contacted by In Basket message regarding post-acute services and collaboration around plan of care.              General  Care Manager assessed the patient by : Discussion with Clinical Care team, Medical record review  Orientation Level: Oriented X4  Who provides care at home?: N/A  Reason for referral: Other - specify in comments, Discharge Planning  Additional Information: ACO pt   Type of Residence: Mailing Address:  [address]  [place] [address]  Contacts: Accompanied by: Alone   Extended Emergency Contact Information  Primary Emergency Contact: Cheslock,[name]  Address: [address] [place], [place] [address] [place]  Home Phone: [phone]  Relation: Spouse  Patient Phone Number: [phone] (home)         Medical Provider(s): Kurtis Bushman, MD  Reason for Admission: Admitting Diagnosis:  No admission diagnoses are documented for this encounter.  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  Past Surgical History:   has a past surgical history that includes Knee surgery (Right, 09/23/2006); Cardiac catheterization (03/08/2007); Cystourethroscopy (12/31/2009 ); pr inser hart pacer xvenous atrial (N/A, 05/30/2015); pr ephys eval w/ ablation supravent arrhythmia (N/A, 07/19/2015); pr upper gi endoscopy,biopsy (N/A, 02/28/2016); pr upper gi endoscopy,diagnosis (N/A, 05/07/2016); pr cath place/coron angio, img super/interp,r&l hrt cath, l hrt ventric (N/A, 02/13/2017); pr cath place/coron angio, img super/interp,w left heart ventriculography (N/A, 03/17/2017); pr insert/place flow direct cath (N/A, 03/17/2017); pr ecmo/ecls initiation veno-arterial (N/A, 03/19/2017); pr rebl ves direct,low extrem (Left, 03/19/2017); pr ecmo/ecls rmvl prph cannula open 6 yrs & older (Left, 03/25/2017); pr remv art clot iliac-pop,leg incis (Left, 03/25/2017); pr negative pressure wound therapy dme >50 sq cm (Left, 03/25/2017); pr insj/rplcmt perm dfb w/trnsvns lds 1/dual chmbr (N/A, 05/04/2017); pr prq trluml coronary stent w/angio one art/brnch (N/A, 03/12/2018); pr cath place/coron angio, img super/interp,w left heart ventriculography (N/A, 05/12/2018); and pr cath place/coron angio, img super/interp,w left heart ventriculography (N/A, 10/22/2018).   Previous admit date: 10/15/2018    Primary Insurance- Payor: MEDICARE / Plan: MEDICARE PART A AND PART B / Product Type: *No Product type* /   Secondary Insurance ??? Secondary Insurance  MEDICAID [place]  Prescription Coverage ??? same as above  Preferred Pharmacy - [facility] - [place], [place] - [address]  [facility]  [facility] - [place] - [address]  [facility] [place] - [address]  [facility] [place] - [address]  [facility]  [facility]  [facility] - [place] ([place]), [place] - [address]    Transportation home: Private vehicle  Level of function prior to admission: Independent    Contact/Decision Programmer, applications Information  Primary Emergency Contact: Stierwalt,[name]  Address: [address] [place], [place] [address] [place]  Home Phone: [phone] Relation: Spouse    Legal Next of Kin / Guardian / POA / Advance Directives     [name] [facility]): Anglin,[name] - [phone]    Health Care Decision Maker [[name]] (Medical & Mental Health Treatment)  Healthcare Decision Maker: [name] documented in the [name]/Contact Info section.    Patient Information  Lives with: Spouse/significant other, Family members    Type of Residence: Private residence     Location/Detail: [place], [place]    Support Systems: Spouse, Family Members, Children    Responsibilities/Dependents at home?: No    Home Care services in place prior to admission?: No    Equipment Currently Used at Home: respiratory supplies(CPAP)  Current HME Agency (Name/Phone #): CPAP from [facility]    Currently receiving outpatient dialysis?: No    Financial Information    Need for financial assistance?: No    Social Determinants of Health  Social History     Socioeconomic History   ??? Marital status: Married     Spouse name: [name]   ??? Number of children: 3   ??? Years of education: 39   ???  Highest education level: None   Occupational History   ??? Occupation: DISABLED     Comment: Former Surveyor, quantity Needs   ??? Financial resource strain: Very hard   ??? Food insecurity     Worry: Sometimes true     Inability: Sometimes true   ??? Transportation needs     Medical: Yes     Non-medical: Yes   Tobacco Use   ??? Smoking status: Never Smoker   ??? Smokeless tobacco: Current User     Types: Chew   ??? Tobacco comment: Pt in process of reducing tobacco use,  dips 1 can every 2 weeks,   Substance and Sexual Activity   ??? Alcohol use: Never     Alcohol/week: 0.0 standard drinks     Frequency: Never     Comment: rare use: couple times a year, small amount   ??? Drug use: No   ??? Sexual activity: Yes     Partners: Female   Lifestyle   ??? Physical activity     Days per week: None     Minutes per session: None   ??? Stress: None   Relationships   ??? Social Wellsite geologist on phone: More than three times a week     Gets together: More than three times a week     Attends religious service: 1 to 4 times per year     Active member of club or organization: No     Attends meetings of clubs or organizations: Never     Relationship status: Married   Other Topics Concern   ??? Do you use sunscreen? No   ??? Tanning bed use? No   ??? Are you easily burned? No   ??? Excessive sun exposure? No   ??? Blistering sunburns? No   Social History Narrative    Information updated:  01/08/16. Reviewed 08/12/17:        Born in [place]    Raised with both parents    Parents died w/in 6 mos of each other. His parents knew they were going to pass and told him just before.    Half sister and 2 half brothers        Married 30 yrs. Wife [name]    2 other children son and daughter, adopted out    Daughter Bridgette, bf DJ        Education - completed through [employer]. 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    Former Presenter, broadcasting since age 24    Lives with wife, difficulty paying bills 12/30/17     Lives next door to daughter, husband and their grandson        05/19/18: pt moving to new home in next 5 days- after an eviction. Has Medicaid assistance again. Will be w/ wife [name], they babysit their grandson often.    06/14/18: Pt and husband living with daughter while new location to live is determined.     Housing/Utilities   ??? Within the past 12 months, have you ever stayed: outside, in a car, in a tent, in an overnight shelter, or temporarily in someone else's home (i.e. couch-surfing)? Yes    ??? Are you worried about losing your housing? Yes    ??? Within the past  12 months, have you been unable to get utilities (heat, electricity) when it was really needed? Yes      Literacy   ??? How often do you need to have someone help you when you read instructions, pamphlets, or other written material from your doctor or pharmacy? Never        Discharge Needs Assessment  Concerns to be Addressed: no discharge needs identified    Clinical Risk Factors: New Diagnosis    Barriers to taking medications: No    Prior overnight hospital stay or ED visit in last 90 days: Yes    Readmission Within the Last 30 Days: no previous admission in last 30 days    Anticipated Changes Related to Illness: none    Equipment Needed After Discharge: cane, straight, wheelchair, manual, respiratory supplies, negative pressure wound therapy device, walker, rolling    Discharge Facility/Level of Care Needs: other (see comments)(Home with self-care)    Readmission  Risk of Unplanned Readmission Score:  %  Predictive Model Details   No score data available for [facility] Risk of Unplanned Readmission     Readmitted Within the Last 30 Days? (No if blank)   Patient at risk for readmission?: Yes    Discharge Plan  Screen findings are: Care Manager reviewed the plan of the patient's care with the Multidisciplinary Team. No discharge planning needs identified at this time. Care Manager will continue to manage plan and monitor patient's progress with the team.    Expected Discharge Date:     Expected Transfer from Critical Care:      Patient and/or family were provided with choice of facilities / services that are available and appropriate to meet post hospital care needs?: N/A    Initial Assessment complete?: Yes

## 2018-12-01 NOTE — Unmapped (Signed)
I had already received message about appointment request this week to which I have responded.    I also had discussed with admin the request for visitor, however, due to outpatient services policy, does not meet criteria to allow visitor in clinic.    I was the supervising physician in the delivery of the service. Kurtis Bushman, MD

## 2018-12-02 DIAGNOSIS — F3342 Major depressive disorder, recurrent, in full remission: Secondary | ICD-10-CM

## 2018-12-02 NOTE — Unmapped (Signed)
In for 10/7 which is the 1st opening and patient is aware

## 2018-12-02 NOTE — Unmapped (Signed)
Pt left vm w/ CCM re:  Rescheduling of recent f8f visit missed.  Will notify provider and scheduling.

## 2018-12-02 NOTE — Unmapped (Signed)
Rheumatology Clinic Follow-up Visit Note     Assessment and Plan: Jeremy Oliver is a 48 y.o.  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  He denies any recent flares and does not have tophi on exam. His uric acid is above goal, most recently at 8.2, and he is on low-dose allopurinol which we discussed increasing. He was encouraged to reach out with any flare symptoms as history only antiinflammatory option is Prednisone given degree of CKD.     Chronic gout, and knee osteoarthritis:   - Increase to Allopurinol 150mg  daily   - Repeat uric acid with next set of labs, and titrate allopurinol   - Encouraged to call if he has a symptomatic flare as he is not able to be on prophylactic anti-inflammatory medications due to CKD and diabetes.  Would consider prednisone in the setting of a flare as he can titrate his insulin.    CKD Stage 4-5:   - Follows with nephrology, the patient is unsure about timing of dialysis    Return in about 6 months (around 06/02/2019).     Patient was seen and discussed with Dr. Olean Ree.     Janae Bridgeman, MD  Rheumatology Fellow PGY5  Pager [phone]    HPI: Jeremy Oliver is a 48 y.o.  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     Disease History:   His wife notes he has had a knee aspiration at [facility] previously and had gout crystals detected.  He has been on allopurinol and colchicine in the past, the colchicine has been stopped due to progressive renal dysfunction.  She notes that he has had attacks in most all joints in his body.  During the attacks he has severe pain with swelling that causes him to cry.  Most recently he has not had any obvious attacks but has had chronic knee and ankle pain, as well as chronic pain in his lower extremities.  He also has had lower extremity edema that has improved with Bumex.  He has not had any chronic neck or back symptoms.      Interval History: He was last seen virtually in June 2020. He had an NSTEMI in July with stent to the RCA. He was also recently seen for shoulder pain due to his child jumping on the shoulder. He denies any gout flares in the last 6 months. He has gotten them in his knees previously. He has had a left foot drop and bilateral ulnar neuropathy since his 2018 2-3 month ICU stay. The left left brace causes calf pain.     ROS: 14 systems were reviewed and are negative except for that mentioned in the HPI.     Allergies: Losartan and Morphine  Immunizations:   Immunization History   Administered Date(s) Administered   ??? INFLUENZA TIV (TRI) PF (IM) 12/09/2007, 12/27/2008, 01/09/2011   ??? Influenza Vaccine Quad (IIV4 PF) 71mo+ injectable 01/09/2012, 12/06/2012, 03/02/2014, 12/21/2014, 12/05/2015, 12/09/2016, 12/04/2017   ??? Novel Influenza-h1n1-09,  All Formulations 03/13/2008   ??? PNEUMOCOCCAL POLYSACCHARIDE 23 02/10/2007   ??? TdaP 08/30/2009      PMHx:   Past Medical History:   Diagnosis Date   ??? Angina at rest (CMS-[name])    ??? Arthritis    ??? Asthma    ??? Atrial fibrillation and flutter (CMS-[name]) 06/08/14   ??? BMI 40.0-44.9, adult (CMS-[name]) 10/29/2015   ??? Chronic anticoagulation 02/17/2018   ??? Chronic kidney disease, stage 3 (CMS-[name])    ??? Chronic pain syndrome 04/11/2014    ~Use daily lyrica as recommended ~Tramadol as needed for pain ~Fluoxetine daily as recommended ~Daily exercise ~Eat a healthy diet ~Good control of your blood sugars can help    ??? Coronary artery disease    ??? Depression    ??? Diabetes mellitus (CMS-[name])    ??? Eye trauma 1998    glass in both eyes and removed   ??? GERD (gastroesophageal reflux disease)    ??? Gout    ??? Homeless    ??? Hyperlipidemia    ??? Hypertension    ??? Illiteracy and low-level literacy    ??? Microscopic hematuria    ??? Migraine    ??? Nephrolithiasis    ??? 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 Nonalcoholic fatty liver disease    ??? NSTEMI (non-ST elevated myocardial infarction) (CMS-[name]) 01/13/2017   ??? Obesity    ??? Obstructive sleep apnea 2009   ??? Panic attacks    ??? 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 Seizure-like activity (CMS-[name])     ~Monitor for symptoms and report them to your primary care provider if they occur    ??? Systolic CHF, chronic (CMS-[name])     EF 40-45% 2017   ??? TIA (transient ischemic attack)      PSHx:   Past Surgical History:   Procedure Laterality Date   ??? CARDIAC CATHETERIZATION  03/08/2007   ??? CYSTOURETHROSCOPY  12/31/2009     Microscopic hematuria   ??? KNEE SURGERY Right 09/23/2006    arthroscopic knee surgery medial and lateral meniscus tears as well as crystal disease   ??? PR CATH PLACE/CORON ANGIO, IMG SUPER/INTERP,R&L HRT CATH, L HRT VENTRIC N/A 02/13/2017    Procedure: Left/Right Heart Catheterization;  Surgeon: Marlaine Hind, MD;  Location: [facility] CATH;  Service: Cardiology   ??? PR CATH PLACE/CORON ANGIO, IMG SUPER/INTERP,W LEFT HEART VENTRICULOGRAPHY N/A 03/17/2017    Procedure: Left Heart Catheterization W Intervention;  Surgeon: Marlaine Hind, MD;  Location: [facility] CATH;  Service: Cardiology   ??? PR CATH PLACE/CORON ANGIO, IMG SUPER/INTERP,W LEFT HEART VENTRICULOGRAPHY N/A 05/12/2018    Procedure: CATH LEFT HEART CATHETERIZATION W INTERVENTION;  Surgeon: Dorathy Kinsman, MD;  Location: [facility] CATH;  Service: Cardiology   ??? PR CATH PLACE/CORON ANGIO, IMG SUPER/INTERP,W LEFT HEART VENTRICULOGRAPHY N/A 10/22/2018    Procedure: CATH LEFT HEART CATHETERIZATION W INTERVENTION;  Surgeon: Marlaine Hind, MD;  Location: [facility] CATH;  Service: Cardiology   ??? PR ECMO/ECLS INITIATION VENO-ARTERIAL N/A 03/19/2017    Procedure: ECMO/ECLS; INITIATION, VENO-ARTERIAL;  Surgeon: Lennie Odor, MD;  Location: MAIN OR [facility];  Service: Cardiac Surgery   ??? PR ECMO/ECLS RMVL PRPH CANNULA OPEN 6 YRS & OLDER Left 03/25/2017    Procedure: ECMO / ECLS PROVIDED BY PHYSICIAN; REMOVAL OF PERIPHERAL (ARTERIAL AND/OR VENOUS) CANNULA(E), OPEN, 6 YEARS AND OLDER;  Surgeon: Alonna Buckler Ikonomidis, MD;  Location: MAIN OR [facility];  Service: Cardiac Surgery   ??? PR EPHYS EVAL W/ ABLATION SUPRAVENT ARRHYTHMIA N/A 07/19/2015    Catheter ablation of cavotricuspid isthmus for atrial flutter [facility])   ??? PR INSER HART PACER XVENOUS ATRIAL N/A 05/30/2015    [employer] dual-chamber pacemaker implant (E.Chung)   ??? PR INSERT/PLACE FLOW DIRECT CATH N/A 03/17/2017    Procedure: Insert Leave In [place];  Surgeon: Marlaine Hind, MD;  Location: [facility] CATH;  Service: Cardiology   ??? PR INSJ/RPLCMT PERM DFB W/TRNSVNS LDS 1/DUAL CHMBR N/A 05/04/2017    Procedure: ICD Implant System (Single/Dual);  Surgeon: Eldred Manges, MD;  Location: [facility] CATH;  Service: Cardiology   ??? PR NEGATIVE PRESSURE WOUND THERAPY DME >50 SQ CM Left 03/25/2017    Procedure: Neg Press Wound Tx (Vac Assist) Incl Topicals, Per Session, Tsa Greater Than/= 50 Cm Squared;  Surgeon: Alonna Buckler Ikonomidis, MD;  Location: MAIN OR [facility];  Service: Cardiac Surgery   ??? PR PRQ TRLUML CORONARY STENT W/ANGIO ONE ART/BRNCH N/A 03/12/2018    Procedure: Percutaneous Coronary Intervention;  Surgeon: Marlaine Hind, MD;  Location: [facility] CATH;  Service: Cardiology   ??? PR REBL VES DIRECT,LOW EXTREM Left 03/19/2017 Procedure: Repr Bld Vessel Direct; Lower Extrem;  Surgeon: Boykin Reaper, MD;  Location: MAIN OR [facility];  Service: Vascular   ??? PR REMV ART CLOT ILIAC-POP,LEG INCIS Left 03/25/2017    Procedure: EMBOLECTOMY OR THROMBECTOMY, WITH OR WITHOUT CATHETER; FEMOROPOPLITEAL, AORTOILIAC ARTERY, BY LEG INCISION;  Surgeon: Alonna Buckler Ikonomidis, MD;  Location: MAIN OR [facility];  Service: Cardiac Surgery   ??? PR UPPER GI ENDOSCOPY,BIOPSY N/A 02/28/2016    Procedure: UGI ENDOSCOPY; WITH BIOPSY, SINGLE OR MULTIPLE;  Surgeon: Liane Comber, MD;  [facility];  Service: Gastroenterology   ??? PR UPPER GI ENDOSCOPY,DIAGNOSIS N/A 05/07/2016    Procedure: UGI ENDO, INCLUDE ESOPHAGUS, STOMACH, & DUODENUM &/OR JEJUNUM; DX W/WO COLLECTION SPECIMN, BY BRUSH OR WASH;  Surgeon: Modena Nunnery, MD;  Location: [facility];  Service: Gastroenterology     Family Hx:   Family History   Problem Relation Age of Onset   ??? Diabetes Mother    ??? Hyperlipidemia Mother    ??? Heart disease Mother    ??? Basal cell carcinoma Mother    ??? Blindness Mother         diabeties   ??? Obesity Mother    ??? Hyperlipidemia Father    ??? Heart disease Father    ??? Lung disease Father    ??? Heart disease Half-Sister    ??? Kidney disease Half-Sister    ??? Gallbladder disease Half-Brother    ??? Obesity Half-Brother    ??? No Known Problems Daughter    ??? No Known Problems Son    ??? Obesity Daughter    ??? Melanoma Neg Hx    ??? Squamous cell carcinoma Neg Hx    ??? Macular degeneration Neg Hx    ??? Glaucoma Neg Hx      Social Hx:   Social History     Socioeconomic History   ??? Marital status: Married     Spouse name: [name]   ??? Number  of children: 3   ??? Years of education: 18   ??? Highest education level: Not on file   Occupational History   ??? Occupation: DISABLED     Comment: Former Surveyor, quantity Needs   ??? Financial resource strain: Very hard   ??? Food insecurity     Worry: Sometimes true     Inability: Sometimes true   ??? Transportation needs     Medical: Yes     Non-medical: Yes   Tobacco Use   ??? Smoking status: Never Smoker   ??? Smokeless tobacco: Current User     Types: Chew   ??? Tobacco comment: Pt in process of reducing tobacco use,  dips 1 can every 2 weeks,   Substance and Sexual Activity   ??? Alcohol use: Never     Alcohol/week: 0.0 standard drinks     Frequency: Never     Comment: rare use: couple times a year, small amount   ??? Drug use: No   ??? Sexual activity: Yes     Partners: Female   Lifestyle   ??? Physical activity     Days per week: Not on file     Minutes per session: Not on file   ??? Stress: Not on file   Relationships   ??? Social Wellsite geologist on phone: More than three times a week     Gets together: More than three times a week     Attends religious service: 1 to 4 times per year     Active member of club or organization: No     Attends meetings of clubs or organizations: Never     Relationship status: Married   Other Topics Concern   ??? Do you use sunscreen? No   ??? Tanning bed use? No   ??? Are you easily burned? No   ??? Excessive sun exposure? No   ??? Blistering sunburns? No   Social History Narrative    12/01/18: pt living with wife and friends. Friends have two young children. [name] states soon they will have their own place, however doesn't know when. Pt states it will be after the neighbors are evicted, we can move in. Pt will need to start dialysis soon. Pt and wife Not talking with daughter and grandson lately.             Information updated:  01/08/16. Reviewed 08/12/17:        Born in [place]    Raised with both parents    Parents died w/in 6 mos of each other. His parents knew they were going to pass and told him just before.    Half sister and 2 half brothers        Married 30 yrs. Wife [name]    2 other children son and daughter, adopted out    Daughter Bridgette, bf DJ        Education - completed through 11th. 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    Former Presenter, broadcasting since age 50    Lives with wife, difficulty paying bills 12/30/17     Lives next door to daughter, husband and their grandson        05/19/18: pt moving to new home in next 5 days- after  an eviction. Has Medicaid assistance again. Will be w/ wife [name], they babysit their grandson often.    06/14/18: Pt and husband living with daughter while new location to live is determined.       Medications:   Current Outpatient Medications   Medication Sig Dispense Refill   ??? acetaminophen (TYLENOL) 500 MG tablet TAKE 2 TABLETS (1 000MG ) BY MOUTH EVERY 8 HOURS AS NEEDED FOR PAIN 180 each 3   ??? albuterol HFA 90 mcg/actuation inhaler Inhale 2 puffs into the lungs every 6 (six) hours as needed for wheezing or shortness of breath. 18 g 2   ??? alirocumab (PRALUENT) 150 mg/mL subcutaneous injection Inject 1 mL (150 mg total) under the skin every fourteen (14) days. 4 Syringe 11   ??? allopurinoL (ZYLOPRIM) 100 MG tablet Take 1.5 tablets (150 mg total) by mouth daily. 60 tablet 3   ??? amiodarone (PACERONE) 200 MG tablet TAKE 1 TABLET BY MOUTH ONCE DAILY 90 tablet 3   ??? amLODIPine (NORVASC) 5 MG tablet Take 1 tablet (5 mg total) by mouth daily. 90 tablet 3   ??? arm brace Misc 1 each by Miscellaneous route daily. 1 each 0   ??? atorvastatin (LIPITOR) 80 MG tablet Take 1 tablet (80 mg total) by mouth daily. 90 tablet 3   ??? blood sugar diagnostic Strp Disp to match current ins approved meter. Use to test blood sugar four times daily. E11.22, Z79.4 400 each 3   ??? blood-glucose meter kit Disp. blood glucose meter kit preferred by patient's insurance. Dx: Diabetes, E11.9 1 each 11   ??? bumetanide (BUMEX) 2 MG tablet Take 1 tablet (2 mg total) by mouth daily. 90 tablet 3   ??? carvediloL (COREG) 25 MG tablet Take 1.5 tablets (37.5 mg total) by mouth Two (2) times a day. 270 tablet 3   ??? cholecalciferol, vitamin D3, 5,000 unit capsule Take 1 capsule (5,000 Units total) by mouth daily. 100 capsule 0   ??? diclofenac sodium (VOLTAREN) 1 % gel Apply 2 grams topically Three (3) times a day. 100 g 11   ??? empty container Misc Use as directed to dispose of injectable medications 1 each 3   ??? ezetimibe (ZETIA) 10 mg tablet TAKE 1 TABLET BY MOUTH ONCE DAILY 90 each 3   ??? FLUoxetine (PROZAC) 40 MG capsule Take 2 capsules (80 mg total) by mouth daily. 180 capsule 3   ??? gabapentin (NEURONTIN) 300 MG capsule Take 1 capsule (300 mg total) by mouth two (2) times a day. 180 capsule 3   ??? insulin ASPART (NOVOLOG FLEXPEN U-100 INSULIN) 100 unit/mL (3 mL) injection pen Inject 0.2 mL (20 Units total) under the skin three (3) times a day with a meal. 15 mL 11   ??? insulin detemir U-100 (LEVEMIR) 100 unit/mL (3 mL) injection pen Inject 0.4 mL (40 Units total) under the skin nightly. 15 mL 3   ??? insulin syringe-needle U-100 0.3 mL 31 gauge x 5/16 Syrg Use to inject insulin twice daily with meals 60 each 0   ??? isosorbide mononitrate (IMDUR) 30 MG 24 hr tablet Take 4 tablets (120 mg total) by mouth daily. 120 tablet 3   ??? isosorbide mononitrate (IMDUR) 30 MG 24 hr tablet Take 1 tablet (30 mg total) by mouth daily. 30 tablet 0   ??? lancets Misc Disp. Ins preferred lancets. Use to test blood sugar four times daily. E11.22, Z79.4 400 each 3   ??? loperamide (IMODIUM) 2 mg capsule Take 1 capsule (  2 mg total) by mouth 4 (four) times a day as needed for diarrhea. 30 capsule 0   ??? nitroglycerin (NITROSTAT) 0.4 MG SL tablet Place 1 tablet (0.4 mg total) under the tongue every five (5) minutes as needed for chest pain. 100 each 3   ??? omeprazole (PRILOSEC) 40 MG capsule Take 1 capsule (40 mg total) by mouth Two (2) times a day (30 minutes before a meal). 180 capsule 3   ??? ondansetron (ZOFRAN) 4 MG tablet Take 1 tablet (4 mg total) by mouth every eight (8) hours as needed for nausea for up to 4 days. 10 tablet 0   ??? rivaroxaban (XARELTO) 15 mg Tab Take 1 tablet (15 mg total) by mouth daily with evening meal. 90 tablet 3   ??? semaglutide 0.25 mg or 0.5 mg(2 mg/1.5 mL) PnIj Inject 0.5 mg under the skin every seven (7) days. 4.5 mL 3   ??? sevelamer (RENVELA) 800 mg tablet TAKE 2 TABLETS (1600MG ) BY MOUTH THREE TIMES DAILY WITH MEALS 540 tablet 3   ??? sodium polystyrene, SPS, with sorbitol (SPS, WITH SORBITOL,) 15-20 gram/60 mL Susp Take 60 mL (15 g) by mouth once a week. 473 mL 3   ??? ticagrelor (BRILINTA) 90 mg Tab TAKE 1 TABLET BY MOUTH TWICE DAILY 180 tablet 3   ??? traMADoL (ULTRAM) 50 mg tablet Take 1 tablet (50 mg total) by mouth every six (6) hours as needed for pain. 15 tablet 0   ??? UNIFINE PENTIPS 31 gauge x 5/16 Ndle USE WITH INSULIN AND VICTOZA 5 TIMES DAILY 400 each 3     No current facility-administered medications for this visit.        Physical Exam: Limited due to video exam  GEN:  Chronically ill appearing.   Chest: No distress on room air  Cardiovascular:  2+ distal pulses equal bilaterally.  GI:  Soft, NT, ND, normal bowel sounds.  No organomegaly.  No abdominal bruit.  MSK:     Hands: No obvious swelling, able to make a fist bilaterally.   Wrists: Nearly normal range of motion, no tenderness or swelling.    Elbows: Nearly normal ROM, no tophi.    Knees: Trace bilateral effusions without erythema, normal ROM.    Ankles: Bilateral soft tissue swelling.   Neuro:  Awake and alert. Inability to dorsiflex left foot. Bilateral 4-5th digit numbness.   Skin:  Several healed 1cm patches.   Psych: Normal mood and affect.     Labs:    Lab Results   Component Value Date    WBC 8.1 10/23/2018    HGB 9.5 (L) 10/23/2018    HCT 29.8 (L) 10/23/2018    PLT 321 10/23/2018     Lab Results   Component Value Date    NA 139 11/24/2018    K 5.0 11/24/2018    CL 112 (H) 11/24/2018    CO2 19.0 (L) 11/24/2018    BUN 33 (H) 11/24/2018    CREATININE 3.58 (H) 11/24/2018    GLU 137 11/24/2018    CALCIUM 9.1 11/24/2018    MG 2.0 10/23/2018    PHOS 4.2 09/30/2018     Lab Results   Component Value Date    BILITOT 0.1 09/28/2018    BILIDIR 0.50 (H) 04/07/2017 PROT 6.5 09/28/2018    ALBUMIN 3.2 (L) 09/28/2018    ALT 11 09/28/2018    AST 15 (L) 09/28/2018    ALKPHOS 73 09/28/2018    GGT 32 01/09/2012  URIC ACID 09/20/18: 8.2    Imaging:     R Knee xray 06/2018: No fracture. No osteolytic or osteoblastic lesions. No erosions. Normal alignment. There is tricompartmental osteophytosis. Mild medial compartment predominant joint space narrowing. Patellar and tibial tuberosity enthesophytosis. There is diffuse soft tissue swelling about the knee. No dissecting soft tissue gas is noted. There is a moderate right knee effusion.    05/2018:  R Elbow: No acute fracture or dislocation.  Chronic common flexor tendinosis  Mild posterior forearm soft tissue swelling Lastinger reflect contusion.

## 2018-12-03 ENCOUNTER — Encounter: Admit: 2018-12-03 | Discharge: 2018-12-04 | Payer: BLUE CROSS/BLUE SHIELD

## 2018-12-03 DIAGNOSIS — M109 Gout, unspecified: Secondary | ICD-10-CM

## 2018-12-03 DIAGNOSIS — M21372 Foot drop, left foot: Secondary | ICD-10-CM

## 2018-12-03 MED ORDER — ALLOPURINOL 100 MG TABLET
ORAL_TABLET | Freq: Every day | ORAL | 3 refills | 40.00000 days | Status: CP
Start: 2018-12-03 — End: 2019-01-05

## 2018-12-03 NOTE — Unmapped (Addendum)
Increase allopurinol 150mg  (1.5 tabs) daily.     We will check a uric acid level with the next set of labs.     Please call me with any gout flares.

## 2018-12-04 ENCOUNTER — Encounter
Admit: 2018-12-04 | Discharge: 2018-12-04 | Disposition: A | Discharge status: Home / Self Care | Payer: BLUE CROSS/BLUE SHIELD | Attending: Emergency Medicine

## 2018-12-04 ENCOUNTER — Ambulatory Visit
Admit: 2018-12-04 | Discharge: 2018-12-04 | Disposition: A | Discharge status: Home / Self Care | Payer: MEDICARE | Attending: Emergency Medicine

## 2018-12-04 DIAGNOSIS — I4892 Unspecified atrial flutter: Secondary | ICD-10-CM

## 2018-12-04 DIAGNOSIS — E785 Hyperlipidemia, unspecified: Secondary | ICD-10-CM

## 2018-12-04 DIAGNOSIS — I252 Old myocardial infarction: Secondary | ICD-10-CM

## 2018-12-04 DIAGNOSIS — F17220 Nicotine dependence, chewing tobacco, uncomplicated: Secondary | ICD-10-CM

## 2018-12-04 DIAGNOSIS — E669 Obesity, unspecified: Secondary | ICD-10-CM

## 2018-12-04 DIAGNOSIS — I480 Paroxysmal atrial fibrillation: Secondary | ICD-10-CM

## 2018-12-04 DIAGNOSIS — R55 Syncope and collapse: Secondary | ICD-10-CM

## 2018-12-04 DIAGNOSIS — I700 Atherosclerosis of aorta: Secondary | ICD-10-CM

## 2018-12-04 DIAGNOSIS — I2510 Atherosclerotic heart disease of native coronary artery without angina pectoris: Secondary | ICD-10-CM

## 2018-12-04 DIAGNOSIS — R0789 Other chest pain: Secondary | ICD-10-CM

## 2018-12-04 DIAGNOSIS — E1122 Type 2 diabetes mellitus with diabetic chronic kidney disease: Secondary | ICD-10-CM

## 2018-12-04 DIAGNOSIS — N183 Chronic kidney disease, stage 3 (moderate): Secondary | ICD-10-CM

## 2018-12-04 DIAGNOSIS — Z950 Presence of cardiac pacemaker: Secondary | ICD-10-CM

## 2018-12-04 DIAGNOSIS — I340 Nonrheumatic mitral (valve) insufficiency: Secondary | ICD-10-CM

## 2018-12-04 DIAGNOSIS — Z8673 Personal history of transient ischemic attack (TIA), and cerebral infarction without residual deficits: Secondary | ICD-10-CM

## 2018-12-04 DIAGNOSIS — M1990 Unspecified osteoarthritis, unspecified site: Secondary | ICD-10-CM

## 2018-12-04 DIAGNOSIS — F329 Major depressive disorder, single episode, unspecified: Secondary | ICD-10-CM

## 2018-12-04 DIAGNOSIS — I130 Hypertensive heart and chronic kidney disease with heart failure and stage 1 through stage 4 chronic kidney disease, or unspecified chronic kidney disease: Secondary | ICD-10-CM

## 2018-12-04 DIAGNOSIS — Z885 Allergy status to narcotic agent status: Secondary | ICD-10-CM

## 2018-12-04 DIAGNOSIS — J45909 Unspecified asthma, uncomplicated: Secondary | ICD-10-CM

## 2018-12-04 DIAGNOSIS — G459 Transient cerebral ischemic attack, unspecified: Secondary | ICD-10-CM

## 2018-12-04 DIAGNOSIS — G894 Chronic pain syndrome: Secondary | ICD-10-CM

## 2018-12-04 DIAGNOSIS — I5022 Chronic systolic (congestive) heart failure: Secondary | ICD-10-CM

## 2018-12-04 LAB — CBC W/ AUTO DIFF
BASOPHILS ABSOLUTE COUNT: 0.1 10*9/L (ref 0.0–0.1)
BASOPHILS RELATIVE PERCENT: 0.6 %
EOSINOPHILS ABSOLUTE COUNT: 0.2 10*9/L (ref 0.0–0.4)
EOSINOPHILS RELATIVE PERCENT: 2.1 %
HEMATOCRIT: 40.8 % — ABNORMAL LOW (ref 41.0–53.0)
LARGE UNSTAINED CELLS: 2 % (ref 0–4)
LYMPHOCYTES ABSOLUTE COUNT: 2.4 10*9/L (ref 1.5–5.0)
LYMPHOCYTES RELATIVE PERCENT: 23.1 %
MEAN CORPUSCULAR HEMOGLOBIN CONC: 31 g/dL (ref 31.0–37.0)
MEAN CORPUSCULAR HEMOGLOBIN: 25.8 pg — ABNORMAL LOW (ref 26.0–34.0)
MEAN CORPUSCULAR VOLUME: 83.1 fL (ref 80.0–100.0)
MEAN PLATELET VOLUME: 7 fL (ref 7.0–10.0)
MONOCYTES ABSOLUTE COUNT: 0.5 10*9/L (ref 0.2–0.8)
MONOCYTES RELATIVE PERCENT: 4.3 %
NEUTROPHILS ABSOLUTE COUNT: 7.2 10*9/L (ref 2.0–7.5)
PLATELET COUNT: 342 10*9/L (ref 150–440)
RED BLOOD CELL COUNT: 4.91 10*12/L (ref 4.50–5.90)
RED CELL DISTRIBUTION WIDTH: 17.2 % — ABNORMAL HIGH (ref 12.0–15.0)
WBC ADJUSTED: 10.6 10*9/L (ref 4.5–11.0)

## 2018-12-04 LAB — COMPREHENSIVE METABOLIC PANEL
ALBUMIN: 3.8 g/dL (ref 3.5–5.0)
ALKALINE PHOSPHATASE: 107 U/L (ref 38–126)
ALT (SGPT): 10 U/L (ref <50)
ANION GAP: 10 mmol/L (ref 7–15)
AST (SGOT): 15 U/L — ABNORMAL LOW (ref 19–55)
BILIRUBIN TOTAL: 0.3 mg/dL (ref 0.0–1.2)
BLOOD UREA NITROGEN: 33 mg/dL — ABNORMAL HIGH (ref 7–21)
BUN / CREAT RATIO: 10
CALCIUM: 9.3 mg/dL (ref 8.5–10.2)
CHLORIDE: 108 mmol/L — ABNORMAL HIGH (ref 98–107)
CO2: 18 mmol/L — ABNORMAL LOW (ref 22.0–30.0)
CREATININE: 3.44 mg/dL — ABNORMAL HIGH (ref 0.70–1.30)
EGFR CKD-EPI NON-AA MALE: 20 mL/min/{1.73_m2} — ABNORMAL LOW (ref >=60)
GLUCOSE RANDOM: 184 mg/dL — ABNORMAL HIGH (ref 70–179)
POTASSIUM: 4.8 mmol/L (ref 3.5–5.0)
PROTEIN TOTAL: 7.4 g/dL (ref 6.5–8.3)

## 2018-12-04 LAB — APTT
Coagulation surface induced:Time:Pt:PPP:Qn:Coag: 33.1
HEPARIN CORRELATION: 0.2

## 2018-12-04 LAB — INR: Lab: 0.94

## 2018-12-04 LAB — MAGNESIUM: Magnesium:MCnc:Pt:Ser/Plas:Qn:: 1.8

## 2018-12-04 LAB — TROPONIN I
Troponin I.cardiac:MCnc:Pt:Ser/Plas:Qn:: <0.06
Troponin I.cardiac:MCnc:Pt:Ser/Plas:Qn:: <0.06

## 2018-12-04 LAB — WBC ADJUSTED: Leukocytes:NCnc:Pt:Bld:Qn:: 10.6

## 2018-12-04 LAB — GLUCOSE RANDOM: Glucose:MCnc:Pt:Ser/Plas:Qn:: 184 — ABNORMAL HIGH

## 2018-12-04 NOTE — Unmapped (Signed)
[facility]  Emergency Department Provider Note      ED Clinical Impression     Final diagnoses:   Intermittent atrial fibrillation (CMS-[name]) (Primary)   Near syncope   Chest pressure       Initial Impression, ED Course, Assessment and Plan     Impression:     Patient is a 48 y.o. male  with a PMH of angina at rest, arthritis, asthma, A-Fib, BMI 40-44.9, chronic anticoagulation, chronic kidney disease stage 3, chronic pain syndrome, CAD, depression, DM, eye trauma, GERD, gout, homelessness, HLD, HTN, illiteracy, migraine, nephrolithiasis, nephropathy, NSTEMI, systolic CHF (EF 16-10% in 2017), and TIA presenting with new onset of chest pain and reported ICD shock senstation this morning in the setting of recent cardiac admission on 10/22/18 for angiogram that revealed significant 90% stenosis of distal RCA s/p stent placement.     BP 180/103. HR 94. Patient is resting on stretcher bed. In NAD. Obese. Otherwise, skin is cool and diaphoretic.     Differential includes tacky arrhythmia, ACS, anxiety, less likely PE given patient is fully anticoagulated.     Plan for CBC, CMP, troponin, PT-INR, aPTT, XR Chest, and EKG. Will give ASA and NS bolus.    8:48 AM   Chest XR normal. Labs reassuring. Troponin negative.     10:04 AM  AICD report from [facility] shows runs of atrial tachycardia and nonsustained ventricular tachycardia w/o any administered shock.     10:13 AM  Spoke with technician who notes several episodes of A fib with RVR that began this morning at 7am, several episodes above 170 bpm.     I have also spoken to the on-call cardiology fellow regarding this patient.  Given that his chest pain was related to atrial fibrillation he is less concerned about potential ACS, feels that pain is related to demand.  If patient has 2- troponins today he is comfortable with patient following up in the outpatient setting.  He does have an appointment on September 8 with cardiac rehab for reevaluation.  I have discussed this plan at length with the patient and his wife at bedside.  He has been chest pain-free here in the emergency department since around 8:15 AM and they are both comfortable with this plan.    12:45 PM  2nd Troponin negative.    ____________________________________________    Time seen: December 04, 2018 7:57 AM       History     Chief Complaint  Tachycardia      HPI   Jeremy Oliver is a 48 y.o. male with a PMH of angina at rest, arthritis, asthma, A-Fib, BMI 40-44.9, chronic anticoagulation, chronic kidney disease stage 3, chronic pain syndrome, CAD, depression, DM, eye trauma, GERD, gout, homelessness, HLD, HTN, illiteracy, migraine, nephrolithiasis, nephropathy, NSTEMI, systolic CHF (EF 96-04% in 2017), and TIA presenting for chest pain. Patient states that this morning at 6:25 AM he had new onset of chest pain, believed he was in A-fib, and felt a shock from his ICD. At present, he endorses persistent chest pain. He also reports some SOB and presyncopal sensations this morning that have since resolved. Personal history includes similar chest pain d/t A-fib. He did not take ASA this morning.     Per chart review: on 10/22/18, patient presented w/ NSTEMI and was admitted for CAD s/p coronary angiogram w/ PCI. Found significant 90% stenosis of distal RCA, elevated LVEDP=22, and successful PCI to the distal RCA w/ placement of a Synergy  4.0 x 20 w/ excellent angiographic result and TIMI 3 flow. Patient was d/c after reassuring EKG. Patient takes ticagrelor 90 mg BID and Xarelto 15 mg daily.     Echocardiogram reading from 10/18/18:     ?? Dilated left ventricle - mild  ?? Moderately decreased left ventricular systolic function, ejection fraction 35%  ?? Segmental wall motion abnormality - severe posterolateral hypokinesis  ?? Mitral regurgitation - mild  ?? Dilated left atrium - moderate  ?? Aortic sclerosis  ?? Normal right ventricular systolic function  ?? Technically difficult study due to chest wall/lung interference  ?? Ultrasound enhancing agent utilized to improve endocardial border definition  ?? Diastolic dysfunction - grade I (normal filling pressures)      Past Medical History:   Diagnosis Date   ??? Angina at rest (CMS-[name])    ??? Arthritis    ??? Asthma    ??? Atrial fibrillation and flutter (CMS-[name]) 06/08/14   ??? BMI 40.0-44.9, adult (CMS-[name]) 10/29/2015   ??? Chronic anticoagulation 02/17/2018   ??? Chronic kidney disease, stage 3 (CMS-[name])    ??? Chronic pain syndrome 04/11/2014    ~Use daily lyrica as recommended ~Tramadol as needed for pain ~Fluoxetine daily as recommended ~Daily exercise ~Eat a healthy diet ~Good control of your blood sugars can help    ??? Coronary artery disease    ??? Depression    ??? Diabetes mellitus (CMS-[name])    ??? Eye trauma 1998    glass in both eyes and removed   ??? GERD (gastroesophageal reflux disease)    ??? Gout    ??? Homeless    ??? Hyperlipidemia    ??? Hypertension    ??? Illiteracy and low-level literacy    ??? Microscopic hematuria    ??? Migraine    ??? Nephrolithiasis    ??? 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 Nonalcoholic fatty liver disease    ??? NSTEMI (non-ST elevated myocardial infarction) (CMS-[name]) 01/13/2017   ??? Obesity    ??? Obstructive sleep apnea 2009   ??? Panic attacks    ??? 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 Seizure-like activity (CMS-[name])     ~Monitor for symptoms and report them to your primary care provider if they occur    ??? Systolic CHF, chronic (CMS-[name])     EF 40-45% 2017   ??? TIA (transient ischemic attack)        Past Surgical History:   Procedure Laterality Date   ??? CARDIAC CATHETERIZATION  03/08/2007   ??? CYSTOURETHROSCOPY  12/31/2009     Microscopic hematuria   ??? KNEE SURGERY Right 09/23/2006    arthroscopic knee surgery medial and lateral meniscus tears as well as crystal disease   ??? PR CATH PLACE/CORON ANGIO, IMG SUPER/INTERP,R&L HRT CATH, L HRT VENTRIC N/A 02/13/2017    Procedure: Left/Right Heart Catheterization;  Surgeon: Marlaine Hind, MD;  Location[facility] C[facility];  Service: Cardiology   ??? PR CATH PLACE/CORON ANGIO, IMG SUPER/INTERP,W LEFT HEART VENTRICULOGRAPHY N/A 03/17/2017    Procedure: Left Heart Catheterization W Intervention;  Surgeon: Marlaine Hind, MD;  Location: [facility] CATH;  Service: Cardiology   ??? PR CATH PLACE/CORON ANGIO, IMG SUPER/INTERP,W LEFT HEART VENTRICULOGRAPHY N/A 05/12/2018    Procedure: CATH LEFT HEART CATHETERIZATION W INTERVENTION;  Surgeon: Dorathy Kinsman, MD;  Location: [facility] CATH;  Service: Cardiology   ??? PR CATH PLACE/CORON ANGIO, IMG SUPER/INTERP,W LEFT HEART VENTRICULOGRAPHY N/A 10/22/2018    Procedure: CATH LEFT HEART CATHETERIZATION W INTERVENTION;  Surgeon: Marlaine Hind, MD;  Location: [facility] CATH;  Service: Cardiology   ??? PR ECMO/ECLS INITIATION VENO-ARTERIAL N/A 03/19/2017    Procedure: ECMO/ECLS; INITIATION, VENO-ARTERIAL;  Surgeon: Lennie Odor, MD;  Location: MAIN OR [facility];  Service: Cardiac Surgery   ??? PR ECMO/ECLS RMVL PRPH CANNULA OPEN 6 YRS & OLDER Left 03/25/2017    Procedure: ECMO / ECLS PROVIDED BY PHYSICIAN; REMOVAL OF PERIPHERAL (ARTERIAL AND/OR VENOUS) CANNULA(E), OPEN, 6 YEARS AND OLDER;  Surgeon: Alonna Buckler Ikonomidis, MD;  Location: MAIN OR [facility]  Service: Cardiac Surgery   ??? PR EPHYS EVAL W/ ABLATION SUPRAVENT ARRHYTHMIA N/A 07/19/2015    Catheter ablation of cavotricuspid isthmus for atrial flutter [facility])   ??? PR INSER HART PACER XVENOUS ATRIAL N/A 05/30/2015    [employer] dual-chamber pacemaker implant (E.Chung)   ??? PR INSERT/PLACE FLOW DIRECT CATH N/A 03/17/2017 Procedure: Insert Leave In [place];  Surgeon: Marlaine Hind, MD;  Location: [facility] CATH;  Service: Cardiology   ??? PR INSJ/RPLCMT PERM DFB W/TRNSVNS LDS 1/DUAL CHMBR N/A 05/04/2017    Procedure: ICD Implant System (Single/Dual);  Surgeon: Eldred Manges, MD;  Location: [facility] CATH;  Service: Cardiology   ??? PR NEGATIVE PRESSURE WOUND THERAPY DME >50 SQ CM Left 03/25/2017    Procedure: Neg Press Wound Tx (Vac Assist) Incl Topicals, Per Session, Tsa Greater Than/= 50 Cm Squared;  Surgeon: Alonna Buckler Ikonomidis, MD;  Location: MAIN OR [facility];  Service: Cardiac Surgery   ??? PR PRQ TRLUML CORONARY STENT W/ANGIO ONE ART/BRNCH N/A 03/12/2018    Procedure: Percutaneous Coronary Intervention;  Surgeon: Marlaine Hind, MD;  Location: [facility] CATH;  Service: Cardiology   ??? PR REBL VES DIRECT,LOW EXTREM Left 03/19/2017    Procedure: Repr Bld Vessel Direct; Lower Extrem;  Surgeon: Boykin Reaper, MD;  Location: MAIN OR [facility];  Service: Vascular   ??? PR REMV ART CLOT ILIAC-POP,LEG INCIS Left 03/25/2017    Procedure: EMBOLECTOMY OR THROMBECTOMY, WITH OR WITHOUT CATHETER; FEMOROPOPLITEAL, AORTOILIAC ARTERY, BY LEG INCISION;  Surgeon: Alonna Buckler Ikonomidis, MD;  Location: MAIN OR [facility];  Service: Cardiac Surgery   ??? PR UPPER GI ENDOSCOPY,BIOPSY N/A 02/28/2016    Procedure: UGI ENDOSCOPY; WITH BIOPSY, SINGLE OR MULTIPLE;  Surgeon: Liane Comber, MD;  Location: [facility];  Service: Gastroenterology   ??? PR UPPER GI ENDOSCOPY,DIAGNOSIS N/A 05/07/2016    Procedure: UGI ENDO, INCLUDE ESOPHAGUS, STOMACH, & DUODENUM &/OR JEJUNUM; DX W/WO COLLECTION SPECIMN, BY BRUSH OR WASH;  Surgeon: Modena Nunnery, MD;  Location: [facility];  Service: Gastroenterology         Current Facility-Administered Medications:   ???  aspirin chewable tablet 324 mg, 324 mg, Oral, Once, Fredricka Bonine, MD    Current Outpatient Medications:   ???  acetaminophen (TYLENOL) 500 MG tablet, TAKE 2 TABLETS (1 000MG ) BY MOUTH EVERY 8 HOURS AS NEEDED FOR PAIN, Disp: 180 each, Rfl: 3  ???  albuterol HFA 90 mcg/actuation inhaler, Inhale 2 puffs into the lungs every 6 (six) hours as needed for wheezing or shortness of breath., Disp: 18 g, Rfl: 2  ???  alirocumab (PRALUENT) 150 mg/mL subcutaneous injection, Inject 1 mL (150 mg total) under the skin every fourteen (14) days., Disp: 4 Syringe, Rfl: 11  ???  allopurinoL (ZYLOPRIM) 100 MG tablet, Take 1.5 tablets (150 mg total) by mouth daily., Disp: 60 tablet, Rfl: 3  ???  amiodarone (PACERONE) 200 MG tablet, TAKE 1 TABLET BY MOUTH ONCE DAILY, Disp: 90 tablet, Rfl: 3  ???  amLODIPine (NORVASC) 5 MG tablet, Take 1 tablet (5 mg total) by mouth daily., Disp: 90 tablet, Rfl: 3  ???  arm brace Misc, 1 each by Miscellaneous route daily., Disp: 1 each, Rfl: 0  ???  atorvastatin (LIPITOR) 80 MG tablet, Take 1 tablet (80 mg total) by mouth daily., Disp: 90 tablet, Rfl: 3  ???  blood sugar diagnostic Strp, Disp to match current ins approved meter. Use to test blood sugar four times daily. E11.22, Z79.4, Disp: 400 each, Rfl: 3  ???  blood-glucose meter kit, Disp. blood glucose meter kit preferred by patient's insurance. Dx: Diabetes, E11.9, Disp: 1 each, Rfl: 11  ???  bumetanide (BUMEX) 2 MG tablet, Take 1 tablet (2 mg total) by mouth daily., Disp: 90 tablet, Rfl: 3  ???  carvediloL (COREG) 25 MG tablet, Take 1.5 tablets (37.5 mg total) by mouth Two (2) times a day., Disp: 270 tablet, Rfl: 3  ???  cholecalciferol, vitamin D3, 5,000 unit capsule, Take 1 capsule (5,000 Units total) by mouth daily., Disp: 100 capsule, Rfl: 0  ???  diclofenac sodium (VOLTAREN) 1 % gel, Apply 2 grams topically Three (3) times a day., Disp: 100 g, Rfl: 11  ???  empty container Misc, Use as directed to dispose of injectable medications, Disp: 1 each, Rfl: 3  ???  ezetimibe (ZETIA) 10 mg tablet, TAKE 1 TABLET BY MOUTH ONCE DAILY, Disp: 90 each, Rfl: 3  ???  FLUoxetine (PROZAC) 40 MG capsule, Take 2 capsules (80 mg total) by mouth daily., Disp: 180 capsule, Rfl: 3  ???  gabapentin (NEURONTIN) 300 MG capsule, Take 1 capsule (300 mg total) by mouth two (2) times a day., Disp: 180 capsule, Rfl: 3  ???  insulin ASPART (NOVOLOG FLEXPEN U-100 INSULIN) 100 unit/mL (3 mL) injection pen, Inject 0.2 mL (20 Units total) under the skin three (3) times a day with a meal., Disp: 15 mL, Rfl: 11  ???  insulin detemir U-100 (LEVEMIR) 100 unit/mL (3 mL) injection pen, Inject 0.4 mL (40 Units total) under the skin nightly., Disp: 15 mL, Rfl: 3  ???  insulin syringe-needle U-100 0.3 mL 31 gauge x 5/16 Syrg, Use to inject insulin twice daily with meals, Disp: 60 each, Rfl: 0  ???  isosorbide mononitrate (IMDUR) 30 MG 24 hr tablet, Take 4 tablets (120 mg total) by mouth daily., Disp: 120 tablet, Rfl: 3  ???  isosorbide mononitrate (IMDUR) 30 MG 24 hr tablet, Take 1 tablet (30 mg total) by mouth daily., Disp: 30 tablet, Rfl: 0  ???  lancets Misc, Disp. Ins preferred lancets. Use to test blood sugar four times daily. E11.22, Z79.4, Disp: 400 each, Rfl: 3  ???  loperamide (IMODIUM) 2 mg capsule, Take 1 capsule (2 mg total) by mouth 4 (four) times a day as needed for diarrhea., Disp: 30 capsule, Rfl: 0  ???  nitroglycerin (NITROSTAT) 0.4 MG SL tablet, Place 1 tablet (0.4 mg total) under the tongue every five (5) minutes as needed for chest pain., Disp: 100 each, Rfl: 3  ???  omeprazole (PRILOSEC) 40 MG capsule, Take 1 capsule (40 mg  total) by mouth Two (2) times a day (30 minutes before a meal)., Disp: 180 capsule, Rfl: 3  ???  ondansetron (ZOFRAN) 4 MG tablet, Take 1 tablet (4 mg total) by mouth every eight (8) hours as needed for nausea for up to 4 days., Disp: 10 tablet, Rfl: 0  ???  rivaroxaban (XARELTO) 15 mg Tab, Take 1 tablet (15 mg total) by mouth daily with evening meal., Disp: 90 tablet, Rfl: 3  ???  semaglutide 0.25 mg or 0.5 mg(2 mg/1.5 mL) PnIj, Inject 0.5 mg under the skin every seven (7) days., Disp: 4.5 mL, Rfl: 3  ???  sevelamer (RENVELA) 800 mg tablet, TAKE 2 TABLETS (1600MG ) BY MOUTH THREE TIMES DAILY WITH MEALS, Disp: 540 tablet, Rfl: 3  ???  sodium polystyrene, SPS, with sorbitol (SPS, WITH SORBITOL,) 15-20 gram/60 mL Susp, Take 60 mL (15 g) by mouth once a week., Disp: 473 mL, Rfl: 3  ???  ticagrelor (BRILINTA) 90 mg Tab, TAKE 1 TABLET BY MOUTH TWICE DAILY, Disp: 180 tablet, Rfl: 3  ???  traMADoL (ULTRAM) 50 mg tablet, Take 1 tablet (50 mg total) by mouth every six (6) hours as needed for pain., Disp: 15 tablet, Rfl: 0  ???  UNIFINE PENTIPS 31 gauge x 5/16 Ndle, USE WITH INSULIN AND VICTOZA 5 TIMES DAILY, Disp: 400 each, Rfl: 3    Allergies  Losartan and Morphine    Family History   Problem Relation Age of Onset   ??? Diabetes Mother    ??? Hyperlipidemia Mother    ??? Heart disease Mother    ??? Basal cell carcinoma Mother    ??? Blindness Mother         diabeties   ??? Obesity Mother    ??? Hyperlipidemia Father    ??? Heart disease Father    ??? Lung disease Father    ??? Heart disease Half-Sister    ??? Kidney disease Half-Sister    ??? Gallbladder disease Half-Brother    ??? Obesity Half-Brother    ??? No Known Problems Daughter    ??? No Known Problems Son    ??? Obesity Daughter    ??? Melanoma Neg Hx    ??? Squamous cell carcinoma Neg Hx    ??? Macular degeneration Neg Hx    ??? Glaucoma Neg Hx        Social History  Social History     Tobacco Use   ??? Smoking status: Never Smoker   ??? Smokeless tobacco: Current User     Types: Chew   ??? Tobacco comment: Pt in process of reducing tobacco use,  dips 1 can every 2 weeks,   Substance Use Topics   ??? Alcohol use: Never     Alcohol/week: 0.0 standard drinks     Frequency: Never     Comment: rare use: couple times a year, small amount   ??? Drug use: No       Review of Systems   Constitutional: Negative for fever.  Eyes: Negative for visual changes.  ENT: Negative for sore throat.  Cardiovascular: Positive for chest pain.  Respiratory: Positive for shortness of breath.  Gastrointestinal: Negative for abdominal pain, vomiting or diarrhea.  Genitourinary: Negative for dysuria.  Musculoskeletal: Negative for back pain.  Skin: Negative for rash.  Neurological: Positive for pre-syncopal sensation. Negative for headaches, focal weakness or numbness.      Physical Exam     VITAL SIGNS:    ED Triage Vitals   Enc Vitals Group  BP 12/04/18 0758 188/122      Heart Rate 12/04/18 0758 102      SpO2 Pulse 12/04/18 0800 99      Resp 12/04/18 0758 20      Temp 12/04/18 0758 36.7 ??C (98 ??F)      Temp Source 12/04/18 0758 Oral      SpO2 12/04/18 0758 98 %       Constitutional: Resting on stretcher bed. In NAD. Obese. Alert and oriented.   Eyes: Conjunctivae are normal.  ENT       Head: Normocephalic and atraumatic.       Nose: No congestion.       Mouth/Throat: Mucous membranes are moist.       Neck: No stridor.  Hematological/Lymphatic/Immunilogical: No cervical lymphadenopathy.  Cardiovascular: Normal rate, regular rhythm. Normal and symmetric distal pulses are present in all extremities.  Respiratory: Normal respiratory effort. Breath sounds are normal.  Gastrointestinal: Soft and nontender. There is no CVA tenderness.  Musculoskeletal: Nontender with normal range of motion in all extremities.       Right lower leg: No tenderness or edema.       Left lower leg: No tenderness or edema.  Neurologic: Normal speech and language. No gross focal neurologic deficits are appreciated.  Skin: Skin is cool and diaphoretic.   Psychiatric: Mood and affect are normal. Speech and behavior are normal.    EKG     EKG:  12/04/2018  Time: 754AM    Rate: 101 bpm    Narrative Interpretation: sinus tachycardia, LVH, anteroseptal infarct present in comparison to prior EKG on 10/23/18     Interpreted by me.          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     COMPARISON: Ultrasound abdomen limited - gallbladder and biliary tree 04/16/2015, CTA aortic dissection dated 01/13/2017     FINDINGS:     Limited examination due to body habitus and overlying bowel gas.     LIVER: The liver is enlarged and slightly increased in echogenicity. No visible focal hepatic lesions. No dilatation of the proximal common bile ducts or intrahepatic biliary ducts. The distal common bile duct is not well-visualized.     The pancreas and distal common bile duct are obscured by overlying bowel gas.     GALLBLADDER: The gallbladder is physiologically distended without internal stones or sludge. Sonographic Eulah Pont sign is negative.   No pericholecystic fluid. No gallbladder wall thickening.     LIMITED RIGHT KIDNEY: No hydronephrosis or calculi.             -Exam is limited by patient body habitus and overlying bowel gas.     -Hepatomegaly and increased echogenicity of the hepatic parenchyma, possibly due to fatty infiltration of the liver.     -No evidence of cholelithiasis or biliary ductal dilatation.         Please see below for data measurements:         Liver: 17.51 cm     Gallbladder wall: 1.8 mm Sonographic  Murphy's Sign: negative Pericholecystic fluid visualized: no     Common hepatic duct: 0.32 cm Proximal common bile duct: 0.72 cm Distal common bile duct: Not visualized     Right kidney length: 13.65 cm             Xr Shoulder 3 Or More Views Right    Result Date: 11/30/2018  EXAM: XR SHOULDER 3 OR MORE VIEWS RIGHT DATE: 11/30/2018 4:47 PM ACCESSION: [id number] UN DICTATED: 11/30/2018 4:51 PM INTERPRETATION LOCATION: Main [facility]     CLINICAL INDICATION: 48 years old Male with Trauma/Injury      COMPARISON: None.     TECHNIQUE: AP, Grashey, and axillary views of the right shoulder.     FINDINGS: Axillary view is suboptimal. No acute fractures.. Joints are approximated. Joints spaces are preserved. Mild cortical irregularity along the greater tuberosity which can be seen with chronic rotator cuff tendinopathy. No focal soft tissue swelling. No radiopaque foreign bodies or dissecting soft tissue gas. Partially imaged lungs are clear.     IMPRESS.ION: No acute osseous abnormalities.     Findings suggestive of chronic rotator cuff tendinopathy.          Pertinent labs & imaging results that were available during my care of the patient were reviewed by me and considered in my medical decision making (see chart for details).    Documentation assistance was provided by Raynald Blend, Scribe, on December 04, 2018 at 8:33 AM for Ronal Fear, MD.     December 06, 2018 8:02 AM. Documentation assistance provided by the scribe. I was present during the time the encounter was recorded. The information recorded by the scribe was done at my direction and has been reviewed and validated by me.        Fredricka Bonine, MD  12/06/18 [phone]

## 2018-12-04 NOTE — Unmapped (Signed)
Care Management  Initial Transition Planning Assessment  ??  Patient has been identified as a Runner, broadcasting/film/video.   Longitudinal Plan of Care reviewed, the following information is noted for continuity of care:  Pt lives with wife. Used to live with their daughter and her family in [place].   ??  Home Safety Screening  Patient Questions:                    1.  Before the illness or injury that brought you to the ED, did you need someone to help you on a regular basis? Do you need people to help you with activities of daily living?  ? Yes - his wife helps him out  2.  In the last 24 hours, have you needed more help than usual?  ? Yes - this morning  3.  Have you been hospitalized for one or more nights during the past six months?   ? Yes - per chart  4.  Have you been to an emergency room one or more times in the past six months?  ? Yes - per chart  5.  In general, do you have problems with your vision that cannot be corrected with glasses/contacts?  ? No  6.  In general, do you have problems with your memory?  ? No  7.  Do you take six or more different medications every day?   ? Yes - per chart  8. Do you have any trouble getting, taking, keeping up with or paying for your medications?   ? No  9   Have you had any falls over the past few weeks or are you concerned about falling?  ? Yes -  incident of falls  10.  Are you having trouble accessing and/or preparing the meals you need?  ? No  11. Do you have wounds or bed ulcers?  ? No  12  Do you have access to transportation?                          Yes - wife can drive him  ??  Care Manager Questions:  Is the patient over the age of 69?  ? No  *Home Safety Evaluation endorsed 6 of the screening items and will update personal health advocate. Personal Health Advocate Pool contacted by In Basket message regarding post-acute services and collaboration around plan of care.              General  Care Manager assessed the patient by : Discussion with Clinical Care team, Medical record review  Orientation Level: Oriented X4  Who provides care at home?: N/A  Reason for referral: Other - specify in comments, Discharge Planning  Additional Information: ACO pt   Type of Residence: Mailing Address:  [address]  [place] [address]  Contacts: Extended Emergency Contact Information  Primary Emergency Contact: Metsker,[name]  Address: [address] [place], [place] [address] [place]  Home Phone: [phone]  Relation: Spouse  Patient Phone Number: [phone] (home)         Medical Provider(s): Kurtis Bushman, MD  Reason for Admission: Admitting Diagnosis:  No admission diagnoses are documented for this encounter.  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  Past Surgical History:   has a past surgical history that includes Knee surgery (Right, 09/23/2006); Cardiac catheterization (03/08/2007); Cystourethroscopy (12/31/2009 ); pr inser hart pacer xvenous atrial (N/A, 05/30/2015); pr ephys eval w/ ablation supravent arrhythmia (N/A, 07/19/2015); pr upper gi endoscopy,biopsy (N/A, 02/28/2016); pr upper gi endoscopy,diagnosis (N/A, 05/07/2016); pr cath place/coron angio, img super/interp,r&l hrt cath, l hrt ventric (N/A, 02/13/2017); pr cath place/coron angio, img super/interp,w left heart ventriculography (N/A, 03/17/2017); pr insert/place flow direct cath (N/A, 03/17/2017); pr ecmo/ecls initiation veno-arterial (N/A, 03/19/2017); pr rebl ves direct,low extrem (Left, 03/19/2017); pr ecmo/ecls rmvl prph cannula open 6 yrs & older (Left, 03/25/2017); pr remv art clot iliac-pop,leg incis (Left, 03/25/2017); pr negative pressure wound therapy dme >50 sq cm (Left, 03/25/2017); pr insj/rplcmt perm dfb w/trnsvns lds 1/dual chmbr (N/A, 05/04/2017); pr prq trluml coronary stent w/angio one art/brnch (N/A, 03/12/2018); pr cath place/coron angio, img super/interp,w left heart ventriculography (N/A, 05/12/2018); and pr cath place/coron angio, img super/interp,w left heart ventriculography (N/A, 10/22/2018).   Previous admit date: 10/15/2018    Primary Insurance- Payor: MEDICARE / Plan: MEDICARE PART A AND PART B / Product Type: *No Product type* /   Secondary Insurance ??? Secondary Insurance  MEDICAID [place]  Prescription Coverage ??? same as above  Preferred Pharmacy - [facility] - [place], [place] - [address]  [facility] [place] [facility]  [facility] - [place] - [address]  [facility] [place] - [address]  [facility] [place] [address]  [facility]  [facility]  [facility] - [place] ([place]), [place] - [address]    Transportation home: Private vehicle  Level of function prior to admission: Independent    Contact/Decision Maker  Extended Emergency Contact Information  Primary Emergency Contact: Jablonsky,[name]  Address: [address] [place], [place] [address] [place]  Home Phone: [phone]  Relation: Spouse    Legal Next of Kin / Guardian / POA / Advance Directives     [name] (HCPOA): Boudoin,[name] - [phone]    Advance Directive (Medical Treatment)  Does patient have an advance directive covering medical treatment?: Patient does not have advance directive covering medical treatment.  Reason patient does not have an advance directive covering medical treatment:: Patient needs follow-up to complete one.    Health Care Decision Maker [[name]] (Medical & Mental Health Treatment)  Healthcare Decision Maker: [name] documented in the [name]/Contact Info section.    Patient Information  Lives with: Spouse/significant other, Family members    Type of Residence: Private residence     Location/Detail: [place], [place]    Support Systems: Spouse, Family Members, Children    Responsibilities/Dependents at home?: No    Home Care services in place prior to admission?: No     Equipment Currently Used at  Home: respiratory supplies  Current HME Agency (Name/Phone #): CPAP from [facility]    Currently receiving outpatient dialysis?: No    Financial Information    Need for financial assistance?: No    Social Determinants of Health  Social History     Socioeconomic History   ??? Marital status: Married     Spouse name: [name]   ??? Number of children: 3   ??? Years of education: 69   ??? Highest education level: None   Occupational History   ??? Occupation: DISABLED     Comment: Former Surveyor, quantity Needs   ??? Financial resource strain: Very hard   ??? Food insecurity     Worry: Sometimes true     Inability: Sometimes true   ??? Transportation needs     Medical: Yes     Non-medical: Yes   Tobacco Use   ??? Smoking status: Never Smoker   ??? Smokeless tobacco: Current User     Types: Chew   ??? Tobacco comment: Pt in process of reducing tobacco use,  dips 1 can every 2 weeks,   Substance and Sexual Activity   ??? Alcohol use: Never     Alcohol/week: 0.0 standard drinks     Frequency: Never     Comment: rare use: couple times a year, small amount   ??? Drug use: No   ??? Sexual activity: Yes     Partners: Female   Lifestyle   ??? Physical activity     Days per week: None     Minutes per session: None   ??? Stress: None   Relationships   ??? Social Wellsite geologist on phone: More than three times a week     Gets together: More than three times a week     Attends religious service: 1 to 4 times per year     Active member of club or organization: No     Attends meetings of clubs or organizations: Never     Relationship status: Married   Other Topics Concern   ??? Do you use sunscreen? No   ??? Tanning bed use? No   ??? Are you easily burned? No   ??? Excessive sun exposure? No   ??? Blistering sunburns? No   Social History Narrative    12/01/18: pt living with wife and friends. Friends have two young children. [name] states soon they will have their own place, however doesn't know when. Pt states it will be after the neighbors are evicted, we can move in. Pt will need to start dialysis soon. Pt and wife Not talking with daughter and grandson lately.             Information updated:  01/08/16. Reviewed 08/12/17:        Born in [place]    Raised with both parents    Parents died w/in 6 mos of each other. His parents knew they were going to pass and told him just before.    Half sister and 2 half brothers        Married 30 yrs. Wife [name]    2 other children son and daughter, adopted out    Daughter Bridgette, bf DJ        Education - completed through [employer]. 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    Former Presenter, broadcasting since age 36    Lives with wife, difficulty paying bills 12/30/17     Lives next door to daughter, husband and their grandson        05/19/18: pt moving to new home in next 5 days- after an eviction. Has Medicaid assistance again. Will be w/ wife [name], they babysit their grandson often.    06/14/18: Pt and husband living with daughter while new location to live is determined.     Housing/Utilities   ??? Within the past 12 months, have you ever stayed: outside, in a car, in a tent, in an overnight shelter, or temporarily in someone else's home (i.e. couch-surfing)? Yes    ??? Are you worried about losing your housing? Yes    ??? Within the past 12 months, have you been unable to get utilities (heat, electricity) when it was really needed? Yes      Literacy   ??? How often do you need to have someone help you when you read instructions, pamphlets, or other written material from your doctor or pharmacy? Never        Discharge Needs Assessment  Concerns to be Addressed: no discharge needs identified    Clinical Risk Factors: New Diagnosis    Barriers to taking medications: No    Prior overnight hospital stay or ED visit in last 90 days: Yes    Readmission Within the Last 30 Days: no previous admission in last 30 days    Anticipated Changes Related to Illness: none    Equipment Needed After Discharge: cane, straight, wheelchair, manual, respiratory supplies, walker, rolling    Discharge Facility/Level of Care Needs: other (see comments)(HH)    Readmission  Risk of Unplanned Readmission Score:  %  Predictive Model Details   No score data available for [facility] Risk of Unplanned Readmission     Readmitted Within the Last 30 Days? (No if blank)   Patient at risk for readmission?: Yes    Discharge Plan  Screen findings are: Care Manager reviewed the plan of the patient's care with the Multidisciplinary Team. No discharge planning needs identified at this time. Care Manager will continue to manage plan and monitor patient's progress with the team.    Expected Discharge Date:     Expected Transfer from Critical Care:      Patient and/or family were provided with choice of facilities / services that are available and appropriate to meet post hospital care needs?: Other (Comment)   List choices in order highest to lowest preferred, if applicable. : Pt is refusing HH currently due to COVID    Initial Assessment complete?: Yes

## 2018-12-04 NOTE — Unmapped (Signed)
Discharge completed and all questions answered. Pt given all paperwork and follow up info including phone numbers. Pt denies questions and left dept with steady gait in no acute distress.

## 2018-12-04 NOTE — Unmapped (Signed)
Pt was assisted out of his vehicle. Pt stating he woke up in A-fib. Pt stating he believed that his defibrillator shocked him this morning. Pt stating one shock. Pt is on a blood thinner. Pt had a syncopal episode once in the bed. Pt c/o CP.

## 2018-12-07 ENCOUNTER — Institutional Professional Consult (permissible substitution): Admit: 2018-12-07 | Discharge: 2019-01-05 | Payer: BLUE CROSS/BLUE SHIELD

## 2018-12-07 DIAGNOSIS — Z95820 Peripheral vascular angioplasty status with implants and grafts: Secondary | ICD-10-CM

## 2018-12-07 MED FILL — PRALUENT PEN 150 MG/ML SUBCUTANEOUS PEN INJECTOR: 56 days supply | Qty: 4 | Fill #3 | Status: AC

## 2018-12-07 MED FILL — PRALUENT PEN 150 MG/ML SUBCUTANEOUS PEN INJECTOR: SUBCUTANEOUS | 56 days supply | Qty: 4 | Fill #3

## 2018-12-07 NOTE — Unmapped (Signed)
Jeremy Oliver came in today for his Initial Evaluation for CARDIAC REHAB. He was oriented to CARDIAC REHAB and the [facility], and he was instructed on signs and symptoms he should report to CARDIAC [facility] staff while he is with [place]. Jeremy Oliver has many co-morbidities and takes a lot of medications. He is fairly familiar with them, and says his wife is the one who knows what they are all for. He says he takes them as prescribed and checks his blood sugar 3x per day. Jeremy Oliver reported that his weight is down from 500 lb in the last 2 years. He is currently 323.9lb. he does not weigh daily. He has foot drop on the left and uses a cane. Jeremy Oliver reported to me that he is supposed to start chemotherapy for his kidneys, but review of his chart reveals that he Geng be starting dialysis, not chemotherapy. His personal goals include finding out what he can and can't do, trying to return to hunting, and not putting his body through a lot of pain. He is scheduled to start CARDIAC REHAB on 01/10/19.

## 2018-12-07 NOTE — Unmapped (Signed)
Cardiac Implantable Electronic Device Remote Monitoring     Visit Date:  10/26/2018    1. Encounter for interrogation of cardiac defibrillator  05/04/17    2. Chronic heart failure with reduced ejection fraction and diastolic dysfunction (CMS-[name])      Findings:   Normal device function  Adequate battery reserve-13 years  Normal lead impedance.  Normal capture threshold and appropriate programmed output.  Normal measured P and R wave and appropriate programmed sensitivity.  Rate histograms reviewed.  Counters reviewed.  Pacing & Tachy therapies reviewed.      Arrhythmias: (6) ATR events, longest 1.5 hrs, known hx of PAF  (10 NSVT events, NO EGMs available.  Plan:   1. Continue Routine Remote Monitoring   __________________________________________________________________    Manufacturer of Device: [employer]       Type of Device: Oceanographer  See scanned/downloaded PDF report for model numbers, serial numbers, and date(s) of implant.    Presenting Rhythm: AS/VS    ______________________________________________________________________  Percentage Biventricular Pacing: n/a    Heart Failure Monitoring: No Evidence of Fluid Accumulation  ______________________________________________________________________      Please see downloaded PDF file of transmission under Media Tab  for full details of device interrogation to include, when applicable,  battery status/charge time, lead trend data, and programmed parameters.

## 2018-12-10 ENCOUNTER — Ambulatory Visit: Admit: 2018-12-10 | Discharge: 2018-12-11 | Payer: BLUE CROSS/BLUE SHIELD

## 2018-12-10 ENCOUNTER — Telehealth: Admit: 2018-12-10 | Discharge: 2018-12-11 | Payer: BLUE CROSS/BLUE SHIELD | Attending: Clinical | Primary: Clinical

## 2018-12-10 DIAGNOSIS — F4320 Adjustment disorder, unspecified: Secondary | ICD-10-CM

## 2018-12-10 DIAGNOSIS — I10 Essential (primary) hypertension: Secondary | ICD-10-CM

## 2018-12-10 DIAGNOSIS — I480 Paroxysmal atrial fibrillation: Secondary | ICD-10-CM

## 2018-12-10 DIAGNOSIS — F439 Reaction to severe stress, unspecified: Secondary | ICD-10-CM

## 2018-12-10 DIAGNOSIS — N184 Chronic kidney disease, stage 4 (severe): Secondary | ICD-10-CM

## 2018-12-10 DIAGNOSIS — G4733 Obstructive sleep apnea (adult) (pediatric): Secondary | ICD-10-CM

## 2018-12-10 MED FILL — XARELTO 15 MG TABLET: 90 days supply | Qty: 90 | Fill #2 | Status: AC

## 2018-12-10 MED FILL — BRILINTA 90 MG TABLET: ORAL | 90 days supply | Qty: 180 | Fill #2

## 2018-12-10 MED FILL — SPS (WITH SORBITOL) 15 GRAM-20 GRAM/60 ML ORAL SUSPENSION: ORAL | 60 days supply | Qty: 473 | Fill #1

## 2018-12-10 MED FILL — ATORVASTATIN 80 MG TABLET: 90 days supply | Qty: 90 | Fill #3 | Status: AC

## 2018-12-10 MED FILL — AMIODARONE 200 MG TABLET: 90 days supply | Qty: 90 | Fill #2 | Status: AC

## 2018-12-10 MED FILL — XARELTO 15 MG TABLET: ORAL | 90 days supply | Qty: 90 | Fill #2

## 2018-12-10 MED FILL — ATORVASTATIN 80 MG TABLET: ORAL | 90 days supply | Qty: 90 | Fill #3

## 2018-12-10 MED FILL — FLUOXETINE 40 MG CAPSULE: ORAL | 90 days supply | Qty: 180 | Fill #1

## 2018-12-10 MED FILL — TRAMADOL 50 MG TABLET: 4 days supply | Qty: 15 | Fill #0 | Status: AC

## 2018-12-10 MED FILL — OZEMPIC 0.25 MG OR 0.5 MG (2 MG/1.5 ML) SUBCUTANEOUS PEN INJECTOR: 56 days supply | Qty: 3 | Fill #2 | Status: AC

## 2018-12-10 MED FILL — AMIODARONE 200 MG TABLET: ORAL | 90 days supply | Qty: 90 | Fill #2

## 2018-12-10 MED FILL — BRILINTA 90 MG TABLET: 90 days supply | Qty: 180 | Fill #2 | Status: AC

## 2018-12-10 MED FILL — SPS (WITH SORBITOL) 15 GRAM-20 GRAM/60 ML ORAL SUSPENSION: 60 days supply | Qty: 473 | Fill #1 | Status: AC

## 2018-12-10 MED FILL — OZEMPIC 0.25 MG OR 0.5 MG (2 MG/1.5 ML) SUBCUTANEOUS PEN INJECTOR: SUBCUTANEOUS | 56 days supply | Qty: 3 | Fill #2

## 2018-12-10 MED FILL — FLUOXETINE 40 MG CAPSULE: 90 days supply | Qty: 180 | Fill #1 | Status: AC
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
## 2018-12-10 NOTE — Unmapped (Addendum)
I have updated your medication list.     I will talk to Dr. Andrey Farmer about any medication changes to best control the AFib.     To control the AFib, it's also important to address triggers including sleep apnea.  Please find your CPAP machine.  We will contact [facility] about new tubing and filters, etc.  Expect a call from them.     I will see you in October.

## 2018-12-10 NOTE — Unmapped (Signed)
6 minute walk performed by patient. Resting O2 sats were 98% on room air, pulse at 84. At 3 minutes into walk, O2 sats at 97% on room air, pulse at 90. At full 6 minute mark, O2 sats were 97% on room air, pulse 88.    -Arletta Bale, LPN

## 2018-12-10 NOTE — Unmapped (Signed)
.  Omron BPs  BP#1 125/82  BP#2  121/79  BP#3   127/83    Average BP   124/81
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
## 2018-12-16 ENCOUNTER — Encounter: Admit: 2018-12-16 | Discharge: 2018-12-17 | Payer: BLUE CROSS/BLUE SHIELD

## 2018-12-16 DIAGNOSIS — Z794 Long term (current) use of insulin: Secondary | ICD-10-CM

## 2018-12-16 DIAGNOSIS — E1122 Type 2 diabetes mellitus with diabetic chronic kidney disease: Secondary | ICD-10-CM

## 2018-12-16 DIAGNOSIS — G4733 Obstructive sleep apnea (adult) (pediatric): Secondary | ICD-10-CM

## 2018-12-16 DIAGNOSIS — I2510 Atherosclerotic heart disease of native coronary artery without angina pectoris: Secondary | ICD-10-CM

## 2018-12-16 DIAGNOSIS — I10 Essential (primary) hypertension: Secondary | ICD-10-CM

## 2018-12-16 DIAGNOSIS — N184 Chronic kidney disease, stage 4 (severe): Secondary | ICD-10-CM

## 2018-12-16 NOTE — Unmapped (Addendum)
I did not change any medicines today.    I will work with your heart doctors to determine the best plan for starting you on dialysis in the hospital when it comes to time.    Let me know if you would like a phone visit with our palliative care nurse. She can help a lot with advanced care planning issues.

## 2018-12-16 NOTE — Unmapped (Signed)
DIVISION OF NEPHROLOGY AND HYPERTENSION  [facility]  [address]  [place], [place] [address]         Date of Service: 12/16/2018      PCP: Referring Provider:   Kurtis Bushman, MD  [address] [place] [address]  [place] [address]  Phone: [phone]  Fax: [phone] Roma Schanz, MD  [address]  [place] [address]  [place],  [place] [address]  Phone: [phone]  Fax: [phone]       12/16/2018    Background: Jeremy Oliver was first seen by [place] in the hospital Jan, 2019 with acute on chronic kidney disease. He has CAD with repeated hospitalizations for chest pain, HFrEF, HTN, paroxysmal atrial fibrillation, DM2 and OSA (on CPAP) who was temporarily on HD during period of AKI.    He and his wife face significant social challenges without a reliable home.    Chief Complaint: fu CKD, HTN, DM2    HPI:  Jeremy Oliver presents for 3-mo fu CKD, HTN, DM2. I last saw him 07/20/18 by phone visit.     He reported to ER 12/04/2018 when he awoke from sleep with HR up. By the time he got to ER, the HR had gone down. He was monitored for 6 hr and then d/c home.    Today, he reports a lot of social issues. The family with whom they are staying is having some troubles, and they have no privacy there with many kids around. They are about to just live in their car.    He does continue to get HAs. He can't use his CPAP because it is in storage.    He is followed by Dr Andrey Farmer in Cardiology who last saw him 11/18/18. Per his note,   He does have severe diffuse multivessel coronary disease with some additional targets for revascularization in the LAD and left circumflex artery.  Unfortunately he is at very high risk for contrast nephropathy and we should only consider repeat revascularization if there is a plan to initiate hemodialysis.    ROS:   CONSTITUTIONAL: denies fevers or chills, denies unintentional weight loss  CARDIOVASCULAR: denies chest pain, denies dyspnea on exertion, denies leg edema  GASTROINTESTINAL: denies nausea, denies vomiting, denies anorexia  GENITOURINARY: denies dysuria, denies hematuria, denies decreased urinary stream  All other systems reviewed and are negative except as listed above.    PAST MEDICAL HISTORY:  Past Medical History:   Diagnosis Date   ??? Angina at rest (CMS-[name])    ??? Arthritis    ??? Asthma    ??? Atrial fibrillation and flutter (CMS-[name]) 06/08/14   ??? BMI 40.0-44.9, adult (CMS-[name]) 10/29/2015   ??? Chronic anticoagulation 02/17/2018   ??? Chronic kidney disease, stage 3 (CMS-[name])    ??? Chronic pain syndrome 04/11/2014    ~Use daily lyrica as recommended ~Tramadol as needed for pain ~Fluoxetine daily as recommended ~Daily exercise ~Eat a healthy diet ~Good control of your blood sugars can help    ??? Coronary artery disease    ??? Depression    ??? Diabetes mellitus (CMS-[name])    ??? Eye trauma 1998    glass in both eyes and removed   ??? GERD (gastroesophageal reflux disease)    ??? Gout    ??? Homeless    ??? Hyperlipidemia    ??? Hypertension    ??? Illiteracy and low-level literacy    ???  Microscopic hematuria    ??? Migraine    ??? Nephrolithiasis    ??? 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 Nonalcoholic fatty liver disease    ??? NSTEMI (non-ST elevated myocardial infarction) (CMS-[name]) 01/13/2017   ??? Obesity    ??? Obstructive sleep apnea 2009   ??? Panic attacks    ??? 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 Seizure-like activity (CMS-[name])     ~Monitor for symptoms and report them to your primary care provider if they occur    ??? Systolic CHF, chronic (CMS-[name])     EF 40-45% 2017   ??? TIA (transient ischemic attack)        PAST SURGICAL HISTORY:  Past Surgical History:   Procedure Laterality Date   ??? CARDIAC CATHETERIZATION  03/08/2007   ??? CORONARY STENT PLACEMENT     ??? CYSTOURETHROSCOPY  12/31/2009     Microscopic hematuria   ??? KNEE SURGERY Right 09/23/2006    arthroscopic knee surgery medial and lateral meniscus tears as well as crystal disease   ??? PR CATH PLACE/CORON ANGIO, IMG SUPER/INTERP,R&L HRT CATH, L HRT VENTRIC N/A 02/13/2017    Procedure: Left/Right Heart Catheterization;  Surgeon: Marlaine Hind, MD;  Location: [facility] CATH;  Service: Cardiology   ??? PR CATH PLACE/CORON ANGIO, IMG SUPER/INTERP,W LEFT HEART VENTRICULOGRAPHY N/A 03/17/2017    Procedure: Left Heart Catheterization W Intervention;  Surgeon: Marlaine Hind, MD;  Location: [facility] CATH;  Service: Cardiology   ??? PR CATH PLACE/CORON ANGIO, IMG SUPER/INTERP,W LEFT HEART VENTRICULOGRAPHY N/A 05/12/2018    Procedure: CATH LEFT HEART CATHETERIZATION W INTERVENTION;  Surgeon: Dorathy Kinsman, MD;  Location: [facility] CATH;  Service: Cardiology   ??? PR CATH PLACE/CORON ANGIO, IMG SUPER/INTERP,W LEFT HEART VENTRICULOGRAPHY N/A 10/22/2018    Procedure: CATH LEFT HEART CATHETERIZATION W INTERVENTION;  Surgeon: Marlaine Hind, MD;  Location: [facility] CATH;  Service: Cardiology   ??? PR ECMO/ECLS INITIATION VENO-ARTERIAL N/A 03/19/2017    Procedure: ECMO/ECLS; INITIATION, VENO-ARTERIAL;  Surgeon: Lennie Odor, MD;  Location: MAIN OR [facility];  Service: Cardiac Surgery   ??? PR ECMO/ECLS RMVL PRPH CANNULA OPEN 6 YRS & OLDER Left 03/25/2017    Procedure: ECMO / ECLS PROVIDED BY PHYSICIAN; REMOVAL OF PERIPHERAL (ARTERIAL AND/OR VENOUS) CANNULA(E), OPEN, 6 YEARS AND OLDER;  Surgeon: Alonna Buckler Ikonomidis, MD;  Location: MAIN OR [facility];  Service: Cardiac Surgery   ??? PR EPHYS EVAL W/ ABLATION SUPRAVENT ARRHYTHMIA N/A 07/19/2015    Catheter ablation of cavotricuspid isthmus for atrial flutter [facility])   ??? PR INSER HART PACER XVENOUS ATRIAL N/A 05/30/2015    [employer] dual-chamber pacemaker implant (E.Chung)   ??? PR INSERT/PLACE FLOW DIRECT CATH N/A 03/17/2017    Procedure: Insert Leave In [place];  Surgeon: Marlaine Hind, MD;  Location: [facility] CATH;  Service: Cardiology   ??? PR INSJ/RPLCMT PERM DFB W/TRNSVNS LDS 1/DUAL CHMBR N/A 05/04/2017    Procedure: ICD Implant System (Single/Dual);  Surgeon: Eldred Manges, MD;  Location: [facility] CATH;  Service: Cardiology   ??? PR NEGATIVE PRESSURE WOUND THERAPY DME >50 SQ CM Left 03/25/2017  Procedure: Neg Press Wound Tx (Vac Assist) Incl Topicals, Per Session, Tsa Greater Than/= 50 Cm Squared;  Surgeon: Alonna Buckler Ikonomidis, MD;  Location: MAIN OR [facility];  Service: Cardiac Surgery   ??? PR PRQ TRLUML CORONARY STENT W/ANGIO ONE ART/BRNCH N/A 03/12/2018    Procedure: Percutaneous Coronary Intervention;  Surgeon: Marlaine Hind, MD;  Location: [facility] CATH;  Service: Cardiology   ??? PR REBL VES DIRECT,LOW EXTREM Left 03/19/2017    Procedure: Repr Bld Vessel Direct; Lower Extrem;  Surgeon: Boykin Reaper, MD;  Location: MAIN OR [facility];  Service: Vascular   ??? PR REMV ART CLOT ILIAC-POP,LEG INCIS Left 03/25/2017    Procedure: EMBOLECTOMY OR THROMBECTOMY, WITH OR WITHOUT CATHETER; FEMOROPOPLITEAL, AORTOILIAC ARTERY, BY LEG INCISION;  Surgeon: Alonna Buckler Ikonomidis, MD;  Location: MAIN OR [facility];  Service: Cardiac Surgery   ??? PR UPPER GI ENDOSCOPY,BIOPSY N/A 02/28/2016    Procedure: UGI ENDOSCOPY; WITH BIOPSY, SINGLE OR MULTIPLE;  Surgeon: Liane Comber, MD;  [facility];  Service: Gastroenterology   ??? PR UPPER GI ENDOSCOPY,DIAGNOSIS N/A 05/07/2016    Procedure: UGI ENDO, INCLUDE ESOPHAGUS, STOMACH, & DUODENUM &/OR JEJUNUM; DX W/WO COLLECTION SPECIMN, BY BRUSH OR WASH;  Surgeon: Modena Nunnery, MD;  Location: [facility];  Service: Gastroenterology       SOCIAL HISTORY:  Social History     Socioeconomic History ??? Marital status: Married     Spouse name: [name]   ??? Number of children: 3   ??? Years of education: 19   ??? Highest education level: Not on file   Occupational History   ??? Occupation: DISABLED     Comment: Former Surveyor, quantity Needs   ??? Financial resource strain: Very hard   ??? Food insecurity     Worry: Sometimes true     Inability: Sometimes true   ??? Transportation needs     Medical: Yes     Non-medical: Yes   Tobacco Use   ??? Smoking status: Never Smoker   ??? Smokeless tobacco: Current User     Types: Chew   ??? Tobacco comment: Pt in process of reducing tobacco use,  dips 1 can every 2 weeks,   Substance and Sexual Activity   ??? Alcohol use: Never     Alcohol/week: 0.0 standard drinks     Frequency: Never     Comment: rare use: couple times a year, small amount   ??? Drug use: No   ??? Sexual activity: Yes     Partners: Female   Lifestyle   ??? Physical activity     Days per week: Not on file     Minutes per session: Not on file   ??? Stress: Not on file   Relationships   ??? Social Wellsite geologist on phone: More than three times a week     Gets together: More than three times a week     Attends religious service: 1 to 4 times per year     Active member of club or organization: No     Attends meetings of clubs or organizations: Never     Relationship status: Married   Other Topics Concern   ??? Do you use sunscreen? No   ??? Tanning bed use? No   ??? Are you easily burned? No   ??? Excessive sun exposure? No   ??? Blistering sunburns? No   Social History Narrative    12/01/18: pt living with wife  and friends. Friends have two young children. [name] states soon they will have their own place, however doesn't know when. Pt states it will be after the neighbors are evicted, we can move in. Pt will need to start dialysis soon. Pt and wife Not talking with daughter and grandson lately.             Information updated:  01/08/16. Reviewed 08/12/17:        Born in [place]    Raised with both parents    Parents died w/in 6 mos of each other. His parents knew they were going to pass and told him just before.    Half sister and 2 half brothers        Married 30 yrs. Wife [name]    2 other children son and daughter, adopted out    Daughter Bridgette, bf DJ        Education - completed through 11th. 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    Former Presenter, broadcasting since age 4    Lives with wife, difficulty paying bills 12/30/17     Lives next door to daughter, husband and their grandson        05/19/18: pt moving to new home in next 5 days- after an eviction. Has Medicaid assistance again. Will be w/ wife [name], they babysit their grandson often.    06/14/18: Pt and husband living with daughter while new location to live is determined.       FAMILY HISTORY:  Unchanged from previous visit.  Family History   Problem Relation Age of Onset   ??? Diabetes Mother    ??? Hyperlipidemia Mother    ??? Heart disease Mother    ??? Basal cell carcinoma Mother    ??? Blindness Mother         diabeties   ??? Obesity Mother    ??? Hyperlipidemia Father    ??? Heart disease Father    ??? Lung disease Father    ??? Heart disease Half-Sister    ??? Kidney disease Half-Sister    ??? Gallbladder disease Half-Brother    ??? Obesity Half-Brother    ??? No Known Problems Daughter    ??? No Known Problems Son    ??? Obesity Daughter    ??? Melanoma Neg Hx    ??? Squamous cell carcinoma Neg Hx    ??? Macular degeneration Neg Hx    ??? Glaucoma Neg Hx        ALLERGIES:  Losartan and Morphine    MEDICATIONS:  Current Outpatient Medications   Medication Sig Dispense Refill   ??? acetaminophen (TYLENOL) 500 MG tablet TAKE 2 TABLETS (1 000MG ) BY MOUTH EVERY 8 HOURS AS NEEDED FOR PAIN (Patient taking differently: Take 500 mg by mouth every four (4) hours as needed. ) 180 each 3   ??? albuterol HFA 90 mcg/actuation inhaler Inhale 2 puffs into the lungs every 6 (six) hours as needed for wheezing or shortness of breath. 18 g 2   ??? alirocumab (PRALUENT) 150 mg/mL subcutaneous injection Inject 1 mL (150 mg total) under the skin every fourteen (14) days. 4 Syringe 11   ??? allopurinoL (ZYLOPRIM) 100 MG tablet Take 1.5 tablets (150 mg total) by mouth daily. 60 tablet 3   ??? amiodarone (PACERONE)  200 MG tablet TAKE 1 TABLET BY MOUTH ONCE DAILY 90 tablet 3   ??? amLODIPine (NORVASC) 5 MG tablet Take 1 tablet (5 mg total) by mouth daily. 90 tablet 3   ??? arm brace Misc 1 each by Miscellaneous route daily. 1 each 0   ??? atorvastatin (LIPITOR) 80 MG tablet Take 1 tablet (80 mg total) by mouth daily. 90 tablet 3   ??? blood sugar diagnostic Strp Disp to match current ins approved meter. Use to test blood sugar four times daily. E11.22, Z79.4 400 each 3   ??? blood-glucose meter kit Disp. blood glucose meter kit preferred by patient's insurance. Dx: Diabetes, E11.9 1 each 11   ??? bumetanide (BUMEX) 2 MG tablet Take 1 tablet (2 mg total) by mouth daily. 90 tablet 3   ??? carvediloL (COREG) 25 MG tablet Take 1.5 tablets (37.5 mg total) by mouth Two (2) times a day. 270 tablet 3   ??? cholecalciferol, vitamin D3, 5,000 unit capsule Take 1 capsule (5,000 Units total) by mouth daily. 100 capsule 0   ??? diclofenac sodium (VOLTAREN) 1 % gel Apply 2 grams topically Three (3) times a day. 100 g 11   ??? empty container Misc Use as directed to dispose of injectable medications 1 each 3   ??? ezetimibe (ZETIA) 10 mg tablet TAKE 1 TABLET BY MOUTH ONCE DAILY 90 each 3   ??? FLUoxetine (PROZAC) 40 MG capsule Take 2 capsules (80 mg total) by mouth daily. 180 capsule 3   ??? gabapentin (NEURONTIN) 300 MG capsule Take 1 capsule (300 mg total) by mouth two (2) times a day. 180 capsule 3   ??? insulin ASPART (NOVOLOG FLEXPEN U-100 INSULIN) 100 unit/mL (3 mL) injection pen Inject 0.2 mL (20 Units total) under the skin three (3) times a day with a meal. 15 mL 11   ??? insulin detemir U-100 (LEVEMIR) 100 unit/mL (3 mL) injection pen Inject 0.4 mL (40 Units total) under the skin nightly. 15 mL 3   ??? insulin syringe-needle U-100 0.3 mL 31 gauge x 5/16 Syrg Use to inject insulin twice daily with meals 60 each 0   ??? isosorbide mononitrate (IMDUR) 30 MG 24 hr tablet Take 4 tablets (120 mg total) by mouth daily. 120 tablet 3   ??? lancets Misc Disp. Ins preferred lancets. Use to test blood sugar four times daily. E11.22, Z79.4 400 each 3   ??? loperamide (IMODIUM) 2 mg capsule Take 1 capsule (2 mg total) by mouth 4 (four) times a day as needed for diarrhea. 30 capsule 0   ??? nitroglycerin (NITROSTAT) 0.4 MG SL tablet Place 1 tablet (0.4 mg total) under the tongue every five (5) minutes as needed for chest pain. 100 each 3   ??? omeprazole (PRILOSEC) 40 MG capsule Take 1 capsule (40 mg total) by mouth Two (2) times a day (30 minutes before a meal). 180 capsule 3   ??? ondansetron (ZOFRAN) 4 MG tablet Take 1 tablet (4 mg total) by mouth every eight (8) hours as needed for nausea for up to 4 days. 10 tablet 0   ??? rivaroxaban (XARELTO) 15 mg Tab Take 1 tablet (15 mg total) by mouth daily with evening meal. 90 tablet 3   ??? semaglutide 0.25 mg or 0.5 mg(2 mg/1.5 mL) PnIj Inject 0.5 mg under the skin every seven (7) days. 4.5 mL 3   ??? sevelamer (RENVELA) 800 mg tablet TAKE 2 TABLETS (1600MG ) BY MOUTH THREE TIMES DAILY WITH MEALS 540 tablet 3   ???  sodium polystyrene, SPS, with sorbitol (SPS, WITH SORBITOL,) 15-20 gram/60 mL Susp Take 60 mL (15 g) by mouth once a week. (Patient taking differently: Take 15 g by mouth once as needed. ) 473 mL 3   ??? ticagrelor (BRILINTA) 90 mg Tab TAKE 1 TABLET BY MOUTH TWICE DAILY 180 tablet 3   ??? UNIFINE PENTIPS 31 gauge x 5/16 Ndle USE WITH INSULIN AND VICTOZA 5 TIMES DAILY 400 each 3     No current facility-administered medications for this visit.        PHYSICAL EXAM:  Vitals:    12/16/18 0939   BP: 156/97   Pulse: 89     Body mass index is 41.62 kg/m??.  CONSTITUTIONAL: Alert,well appearing, no distress  HEENT: Moist mucous membranes, oropharynx clear without erythema or exudate  NECK: Supple, no lymphadenopathy  CARDIOVASCULAR: Regular, normal S1/S2 heart sounds, no murmurs, no rubs.   PULM: Clear to auscultation bilaterally  GASTROINTESTINAL: Soft, active bowel sounds, nontender  MSK/EXTREMITIES: Trace lower extremity edema bilaterally, dorsalis pedis pulses 2+ bilaterally, brace on left shin  SKIN: No rashes or lesions  NEUROLOGIC: No focal motor or sensory deficits    BP Readings from Last 5 Encounters:   12/16/18 156/97   12/10/18 124/81   12/07/18 134/82   12/04/18 127/75   12/03/18 148/90       MEDICAL DECISION MAKING    Results for orders placed or performed during the hospital encounter of 12/04/18   Comprehensive Metabolic Panel   Result Value Ref Range    Sodium 136 135 - 145 mmol/L    Potassium 4.8 3.5 - 5.0 mmol/L    Chloride 108 (H) 98 - 107 mmol/L    Anion Gap 10 7 - 15 mmol/L    CO2 18.0 (L) 22.0 - 30.0 mmol/L    BUN 33 (H) 7 - 21 mg/dL    Creatinine 8.41 (H) 0.70 - 1.30 mg/dL    BUN/Creatinine Ratio 10     EGFR CKD-EPI Non-African American, Male 20 (L) >=60 mL/min/1.97m2    EGFR CKD-EPI African American, Male 23 (L) >=60 mL/min/1.49m2    Glucose 184 (H) 70 - 179 mg/dL    Calcium 9.3 8.5 - 66.0 mg/dL    Albumin 3.8 3.5 - 5.0 g/dL    Total Protein 7.4 6.5 - 8.3 g/dL    Total Bilirubin 0.3 0.0 - 1.2 mg/dL    AST 15 (L) 19 - 55 U/L    ALT 10 <50 U/L    Alkaline Phosphatase 107 38 - 126 U/L   Troponin I   Result Value Ref Range    Troponin I <0.060 <0.060 ng/mL   PT-INR   Result Value Ref Range    PT 10.8 10.2 - 13.1 sec    INR 0.94    aPTT   Result Value Ref Range    APTT 33.1 25.3 - 37.1 sec    Heparin Correlation 0.2    Magnesium Level   Result Value Ref Range    Magnesium 1.8 1.6 - 2.2 mg/dL   Troponin I   Result Value Ref Range    Troponin I <0.060 <0.060 ng/mL   ECG 12 Lead   Result Value Ref Range    EKG Systolic BP  mmHg    EKG Diastolic BP  mmHg    EKG Ventricular Rate 101 BPM    EKG Atrial Rate 101 BPM    EKG P-R Interval 196 ms    EKG QRS Duration 92 ms  EKG Q-T Interval 370 ms    EKG QTC Calculation 479 ms    EKG Calculated P Axis 59 degrees    EKG Calculated R Axis -9 degrees    EKG Calculated T Axis 122 degrees    QTC Fredericia 440 ms   POCT Glucose   Result Value Ref Range    Glucose, POC 184 (H) 70 - 179 mg/dL   CBC w/ Differential   Result Value Ref Range    WBC 10.6 4.5 - 11.0 10*9/L    RBC 4.91 4.50 - 5.90 10*12/L    HGB 12.7 (L) 13.5 - 17.5 g/dL    HCT 08.6 (L) 57.8 - 53.0 %    MCV 83.1 80.0 - 100.0 fL    MCH 25.8 (L) 26.0 - 34.0 pg    MCHC 31.0 31.0 - 37.0 g/dL    RDW 46.9 (H) 62.9 - 15.0 %    MPV 7.0 7.0 - 10.0 fL    Platelet 342 150 - 440 10*9/L    Neutrophils % 67.8 %    Lymphocytes % 23.1 %    Monocytes % 4.3 %    Eosinophils % 2.1 %    Basophils % 0.6 %    Absolute Neutrophils 7.2 2.0 - 7.5 10*9/L    Absolute Lymphocytes 2.4 1.5 - 5.0 10*9/L    Absolute Monocytes 0.5 0.2 - 0.8 10*9/L    Absolute Eosinophils 0.2 0.0 - 0.4 10*9/L    Absolute Basophils 0.1 0.0 - 0.1 10*9/L    Large Unstained Cells 2 0 - 4 %    Microcytosis Slight (A) Not Present    Anisocytosis Slight (A) Not Present    Hypochromasia Slight (A) Not Present     *Note: Due to a large number of results and/or encounters for the requested time period, some results have not been displayed. A complete set of results can be found in Results Review.        Lab Results   Component Value Date    NA 136 12/04/2018    K 4.8 12/04/2018    CL 108 (H) 12/04/2018    CO2 18.0 (L) 12/04/2018    BUN 33 (H) 12/04/2018    CREATININE 3.44 (H) 12/04/2018    CREATININE 3.58 (H) 11/24/2018    CREATININE 3.86 (H) 10/23/2018    CREATININE 4.02 (H) 10/22/2018    CREATININE 4.13 (H) 10/19/2018    CREATININE 3.89 (H) 10/18/2018    GFR >= 60 01/09/2012    GFR >= 60 09/17/2011    GFR >= 60 09/06/2011    GFR >= 60 06/09/2011    GFR >= 60 05/12/2011    GFR >= 60 01/09/2011     Lab Results   Component Value Date    PCRATIOUR 1.669 03/17/2017    ALBUMIN 3.8 12/04/2018    WBC 10.6 12/04/2018    PLT 342 12/04/2018     No components found for: LASTPTH  Lab Results   Component Value Date    WBC 10.6 12/04/2018    HGB 12.7 (L) 12/04/2018    HCT 40.8 (L) 12/04/2018    PLT 342 12/04/2018     Wt Readings from Last 3 Encounters:   12/16/18 (!) 151 kg (333 lb)   12/10/18 (!) 156.8 kg (345 lb 9.6 oz)   12/07/18 (!) 146.9 kg (323 lb 14.4 oz)     IMAGING STUDIES:    [place] Abd Limited 11/24/18  LIMITED RIGHT KIDNEY: No hydronephrosis or calculi.  ASSESSMENT/PLAN:  Jeremy Oliver is a 48 y.o. year old patient with   1. CKD (chronic kidney disease) stage 4, GFR 15-29 ml/min (CMS-[name])    2. Type 2 diabetes mellitus with stage 4 chronic kidney disease, with long-term current use of insulin (CMS-[name])    3. Essential hypertension    4. OSA (obstructive sleep apnea)    5. Coronary artery disease, angina presence unspecified, unspecified vessel or lesion type, unspecified whether native or transplanted heart        1. CKD IV (G4/A3). 2/2 longstanding and poorly controlled HTN and DM2. BL Cr 3-3.8. Most recent Cr at [facility]. He is at high risk for progression with last KFRE 2-yr risk 75% and 5-yr risk 99%. He has no acute indications today, but when he needs another cardiac intervention, he will likely end up requiring RRT.  - Continue to avoid nephrotoxins, including NSAIDs  - Will message Dr Andrey Farmer to let him know he is stable from kidney standpoint clinically, but we can work with him if need for cath becomes more urgent  - Advised hydration (feels dehydrated today)  - I opt against pursuing access creation because given his high cardiac risk, I think this surgery would be high risk. In his case, I think it's better to get a catheter when the time comes, and optimize his cardiac function prior to access surgery.  - If his housing situation becomes stable, he would be interested in pursuing home options, but not currently an option.  - I have advised him to consider advanced care planning and meeting with Cicero Duck in our [facility], but he can only do a phone visit at this time. He would like to consider if things become more stable from social standpoint.    2. HTN. BP a little elevated today, but he didn't take AM meds and found out more about housing/social situation this AM that led to more stress.  - No changes today. Continue current regimen with Imdur 30 daily, carvedilol 37.5 bid, bumex 2 daily, amlodipine 5    3. CKD-MBD. PTH checked 08/12/18 114. Phos checked 09/30/18 at 4.2.  - Continue cholecalciferol 5000 U daily  - Continue sevelamer 1600mg  w meals    4. Anemia. Hgb ok with no requirement for ESAs at this time.    5. Electrolytes. K is ok. CO2 remains low. Would be good to start him on sodium bicarbonate soon.    6. Prevention. On statin and zetia.    7. DM2. On levemir, novolog and DPP-4i. No changes. eGFR prevents [place] from putting him on an SGLT-2i.    Jeremy Oliver will follow up in Return in about 3 months (around 03/17/2019) for Next scheduled follow up. or sooner as needed. If uremic symptoms develop prior to that time or he requires cardiac intervention, I will alert the inpatient team.    I personally spent a total of 30 minutes face-to-face with the patient, of which >50% was spent counseling the patient regarding CKD and management of progression including RRT options.

## 2018-12-20 ENCOUNTER — Encounter: Admit: 2018-12-20 | Discharge: 2018-12-21 | Payer: BLUE CROSS/BLUE SHIELD | Attending: Clinical | Primary: Clinical

## 2018-12-20 DIAGNOSIS — F439 Reaction to severe stress, unspecified: Secondary | ICD-10-CM

## 2018-12-20 DIAGNOSIS — F4320 Adjustment disorder, unspecified: Secondary | ICD-10-CM

## 2018-12-20 NOTE — Unmapped (Signed)
[name] reviewed and updated in Epic:  Completed or Not completed (Reason complete)    PHQ9  Thoughts that you would be better off dead, or of hurting yourself in some way: Not at all  PHQ-9 TOTAL SCORE: 0    P4 Suicidality Screener                GAD7  GAD-7 Total Score: 0    Physical address reviewed and completed
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
## 2018-12-21 NOTE — Unmapped (Signed)
error 

## 2018-12-23 DIAGNOSIS — I130 Hypertensive heart and chronic kidney disease with heart failure and stage 1 through stage 4 chronic kidney disease, or unspecified chronic kidney disease: Secondary | ICD-10-CM

## 2018-12-23 DIAGNOSIS — N183 Chronic kidney disease, stage 3 (moderate): Secondary | ICD-10-CM

## 2018-12-23 DIAGNOSIS — I4891 Unspecified atrial fibrillation: Secondary | ICD-10-CM

## 2018-12-23 DIAGNOSIS — I5022 Chronic systolic (congestive) heart failure: Secondary | ICD-10-CM

## 2018-12-23 DIAGNOSIS — I252 Old myocardial infarction: Secondary | ICD-10-CM

## 2018-12-23 DIAGNOSIS — I2510 Atherosclerotic heart disease of native coronary artery without angina pectoris: Secondary | ICD-10-CM

## 2018-12-23 DIAGNOSIS — E785 Hyperlipidemia, unspecified: Secondary | ICD-10-CM

## 2018-12-23 DIAGNOSIS — Z8673 Personal history of transient ischemic attack (TIA), and cerebral infarction without residual deficits: Secondary | ICD-10-CM

## 2018-12-23 DIAGNOSIS — S90822A Blister (nonthermal), left foot, initial encounter: Secondary | ICD-10-CM

## 2018-12-23 DIAGNOSIS — F17220 Nicotine dependence, chewing tobacco, uncomplicated: Secondary | ICD-10-CM

## 2018-12-23 LAB — CBC W/ AUTO DIFF
BASOPHILS ABSOLUTE COUNT: 0.1 10*9/L (ref 0.0–0.1)
EOSINOPHILS ABSOLUTE COUNT: 0.3 10*9/L (ref 0.0–0.4)
EOSINOPHILS RELATIVE PERCENT: 2.4 %
HEMATOCRIT: 33.1 % — ABNORMAL LOW (ref 41.0–53.0)
HEMOGLOBIN: 10.9 g/dL — ABNORMAL LOW (ref 13.5–17.5)
LARGE UNSTAINED CELLS: 2 % (ref 0–4)
LYMPHOCYTES ABSOLUTE COUNT: 1.8 10*9/L (ref 1.5–5.0)
LYMPHOCYTES RELATIVE PERCENT: 12.7 %
MEAN CORPUSCULAR HEMOGLOBIN CONC: 33 g/dL (ref 31.0–37.0)
MEAN CORPUSCULAR HEMOGLOBIN: 26.9 pg (ref 26.0–34.0)
MEAN CORPUSCULAR VOLUME: 81.5 fL (ref 80.0–100.0)
MEAN PLATELET VOLUME: 7.3 fL (ref 7.0–10.0)
MONOCYTES ABSOLUTE COUNT: 0.8 10*9/L (ref 0.2–0.8)
MONOCYTES RELATIVE PERCENT: 5.6 %
NEUTROPHILS RELATIVE PERCENT: 76.9 %
PLATELET COUNT: 262 10*9/L (ref 150–440)
RED BLOOD CELL COUNT: 4.06 10*12/L — ABNORMAL LOW (ref 4.50–5.90)
RED CELL DISTRIBUTION WIDTH: 17 % — ABNORMAL HIGH (ref 12.0–15.0)
WBC ADJUSTED: 14 10*9/L — ABNORMAL HIGH (ref 4.5–11.0)

## 2018-12-23 LAB — BASIC METABOLIC PANEL
ANION GAP: 13 mmol/L (ref 7–15)
BLOOD UREA NITROGEN: 44 mg/dL — ABNORMAL HIGH (ref 7–21)
CALCIUM: 8.8 mg/dL (ref 8.5–10.2)
CHLORIDE: 110 mmol/L — ABNORMAL HIGH (ref 98–107)
CO2: 18 mmol/L — ABNORMAL LOW (ref 22.0–30.0)
CREATININE: 4.31 mg/dL — ABNORMAL HIGH (ref 0.70–1.30)
EGFR CKD-EPI NON-AA MALE: 15 mL/min/{1.73_m2} — ABNORMAL LOW (ref >=60)
GLUCOSE RANDOM: 133 mg/dL (ref 70–179)
POTASSIUM: 5.2 mmol/L — ABNORMAL HIGH (ref 3.5–5.0)
SODIUM: 141 mmol/L (ref 135–145)

## 2018-12-23 LAB — LYMPHOCYTES RELATIVE PERCENT: Lymphocytes/100 leukocytes:NFr:Pt:Bld:Qn:Automated count: 12.7

## 2018-12-23 LAB — CREATININE: Creatinine:MCnc:Pt:Ser/Plas:Qn:: 4.31 — ABNORMAL HIGH

## 2018-12-23 MED ORDER — AMOXICILLIN 875 MG-POTASSIUM CLAVULANATE 125 MG TABLET
ORAL_TABLET | Freq: Two times a day (BID) | ORAL | 0 refills | 10 days | Status: CP
Start: 2018-12-23 — End: 2018-12-23

## 2018-12-23 MED ORDER — CEPHALEXIN 500 MG CAPSULE
ORAL_CAPSULE | Freq: Four times a day (QID) | ORAL | 0 refills | 10.00000 days | Status: CP
Start: 2018-12-23 — End: 2019-01-03
  Filled 2018-12-24: qty 40, 10d supply, fill #0

## 2018-12-23 MED FILL — DICLOFENAC 1 % TOPICAL GEL: TOPICAL | 17 days supply | Qty: 100 | Fill #7

## 2018-12-23 MED FILL — ACETAMINOPHEN 500 MG TABLET: 30 days supply | Qty: 180 | Fill #1

## 2018-12-23 MED FILL — ACETAMINOPHEN 500 MG TABLET: 30 days supply | Qty: 180 | Fill #1 | Status: AC

## 2018-12-23 MED FILL — NOVOLOG FLEXPEN U-100 INSULIN ASPART 100 UNIT/ML (3 ML) SUBCUTANEOUS: SUBCUTANEOUS | 25 days supply | Qty: 15 | Fill #4

## 2018-12-23 MED FILL — RENVELA 800 MG TABLET: 30 days supply | Qty: 180 | Fill #7 | Status: AC

## 2018-12-23 MED FILL — DICLOFENAC 1 % TOPICAL GEL: 17 days supply | Qty: 100 | Fill #7 | Status: AC

## 2018-12-23 MED FILL — GABAPENTIN 300 MG CAPSULE: ORAL | 90 days supply | Qty: 180 | Fill #1

## 2018-12-23 MED FILL — GABAPENTIN 300 MG CAPSULE: 90 days supply | Qty: 180 | Fill #1 | Status: AC

## 2018-12-23 MED FILL — RENVELA 800 MG TABLET: 30 days supply | Qty: 180 | Fill #7

## 2018-12-23 MED FILL — NOVOLOG FLEXPEN U-100 INSULIN ASPART 100 UNIT/ML (3 ML) SUBCUTANEOUS: 25 days supply | Qty: 15 | Fill #4 | Status: AC

## 2018-12-23 MED FILL — ISOSORBIDE MONONITRATE ER 30 MG TABLET,EXTENDED RELEASE 24 HR: 30 days supply | Qty: 120 | Fill #2 | Status: AC

## 2018-12-23 MED FILL — ISOSORBIDE MONONITRATE ER 30 MG TABLET,EXTENDED RELEASE 24 HR: ORAL | 30 days supply | Qty: 120 | Fill #2

## 2018-12-24 ENCOUNTER — Ambulatory Visit: Admit: 2018-12-24 | Discharge: 2018-12-24 | Disposition: A | Discharge status: Home / Self Care | Payer: MEDICARE

## 2018-12-24 ENCOUNTER — Emergency Department
Admit: 2018-12-24 | Discharge: 2018-12-24 | Disposition: A | Discharge status: Home / Self Care | Payer: BLUE CROSS/BLUE SHIELD

## 2018-12-24 MED ORDER — ALBUTEROL SULFATE HFA 90 MCG/ACTUATION AEROSOL INHALER
RESPIRATORY_TRACT | 2 refills | 0 days | Status: CP
Start: 2018-12-24 — End: ?
  Filled 2019-01-04: qty 18, 25d supply, fill #0

## 2018-12-24 MED FILL — CEPHALEXIN 500 MG CAPSULE: 10 days supply | Qty: 40 | Fill #0 | Status: AC

## 2018-12-24 NOTE — Unmapped (Signed)
Patient contacted on behalf of the [facility] Upfront program. Patient requested call back by case management team. Following up on new or unresolved issues. See details below:    Call Details:    There was no answer at the preferred number. Left message to return call. This is my 1st attempt. Will try back at a later time.      Margaretha Seeds, Paramedic  [facility]  Transitions of Care Team
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
## 2018-12-24 NOTE — Unmapped (Signed)
Patient has blister on left great toe that has popped and now has yellow drainage.

## 2018-12-24 NOTE — Unmapped (Signed)
This patient, Jeremy Oliver, MRN [id number], was signed out to me today, 12/23/2018 by Dr. Elesa Hacker.  I have reviewed the pertinent vital signs, labs, imaging, and prior provider and nursing notes.  Patient is a 48 y.o. male who presented with ulcers to the sole of the left foot and the left 5th digit.  Patient is pending an XR of the left foot.    Documentation assistance was provided by Horton Finer, Scribe, on December 23, 2018 at 11:19 PM for Marisa Severin, MD.    December 23, 2018 11:34 PM. Documentation assistance provided by the scribe. I was present during the time the encounter was recorded. The information recorded by the scribe was done at my direction and has been reviewed and validated by me.       11:34 PM  Xray without osteo.  Pt to be d/c home

## 2018-12-27 NOTE — Unmapped (Signed)
Patient contacted on behalf of the [facility] Upfront program. Patient requested call back by case management team. Following up on new or unresolved issues. See details below:    Call Details:    There was no answer at the preferred number. Left message to return call. This is my 2nd attempt. EOC will be resolved.      Margaretha Seeds, Paramedic  [facility]  Transitions of Care Team

## 2018-12-31 MED ORDER — DOXYCYCLINE MONOHYDRATE 100 MG TABLET
ORAL_TABLET | 0 refills | 0 days
Start: 2018-12-31 — End: ?

## 2019-01-03 NOTE — Unmapped (Signed)
Jeremy Oliver called to say she had to take Jeremy Oliver to the ER twice over the weekend for his diabetic foot ulcer (L foot).  She wants to make sure you check that on Wednesday when he sees you.

## 2019-01-04 MED FILL — DOXYCYCLINE MONOHYDRATE 100 MG TABLET: 10 days supply | Qty: 20 | Fill #0

## 2019-01-04 MED FILL — EZETIMIBE 10 MG TABLET: ORAL | 90 days supply | Qty: 90 | Fill #3

## 2019-01-04 MED FILL — DOXYCYCLINE MONOHYDRATE 100 MG TABLET: 10 days supply | Qty: 20 | Fill #0 | Status: AC

## 2019-01-04 MED FILL — VENTOLIN HFA 90 MCG/ACTUATION AEROSOL INHALER: 25 days supply | Qty: 18 | Fill #0 | Status: AC

## 2019-01-04 MED FILL — EZETIMIBE 10 MG TABLET: 90 days supply | Qty: 90 | Fill #3 | Status: AC

## 2019-01-05 ENCOUNTER — Ambulatory Visit: Admit: 2019-01-05 | Discharge: 2019-01-05 | Payer: BLUE CROSS/BLUE SHIELD

## 2019-01-05 ENCOUNTER — Encounter: Admit: 2019-01-05 | Discharge: 2019-01-05 | Payer: BLUE CROSS/BLUE SHIELD

## 2019-01-05 DIAGNOSIS — E1143 Type 2 diabetes mellitus with diabetic autonomic (poly)neuropathy: Secondary | ICD-10-CM

## 2019-01-05 DIAGNOSIS — F3342 Major depressive disorder, recurrent, in full remission: Secondary | ICD-10-CM

## 2019-01-05 DIAGNOSIS — L97522 Non-pressure chronic ulcer of other part of left foot with fat layer exposed: Secondary | ICD-10-CM

## 2019-01-05 DIAGNOSIS — L03116 Cellulitis of left lower limb: Secondary | ICD-10-CM

## 2019-01-05 DIAGNOSIS — N184 Chronic kidney disease, stage 4 (severe): Secondary | ICD-10-CM

## 2019-01-05 DIAGNOSIS — Z794 Long term (current) use of insulin: Secondary | ICD-10-CM

## 2019-01-05 DIAGNOSIS — I10 Essential (primary) hypertension: Secondary | ICD-10-CM

## 2019-01-05 DIAGNOSIS — E875 Hyperkalemia: Secondary | ICD-10-CM

## 2019-01-05 DIAGNOSIS — E1122 Type 2 diabetes mellitus with diabetic chronic kidney disease: Secondary | ICD-10-CM

## 2019-01-05 MED ORDER — RIVAROXABAN 15 MG TABLET
ORAL_TABLET | Freq: Every day | ORAL | 3 refills | 90 days | Status: CP
Start: 2019-01-05 — End: 2020-01-05
  Filled 2019-03-07: qty 90, 90d supply, fill #0

## 2019-01-05 MED ORDER — INSULIN ASPART (U-100) 100 UNIT/ML (3 ML) SUBCUTANEOUS PEN
Freq: Three times a day (TID) | SUBCUTANEOUS | 11 refills | 25.00000 days | Status: CP
Start: 2019-01-05 — End: ?

## 2019-01-05 MED ORDER — ALLOPURINOL 100 MG TABLET
ORAL_TABLET | Freq: Every day | ORAL | 3 refills | 40.00000 days | Status: CP
Start: 2019-01-05 — End: 2020-01-05
  Filled 2019-01-07: qty 60, 40d supply, fill #0

## 2019-01-05 MED ORDER — NITROGLYCERIN 0.4 MG SUBLINGUAL TABLET
SUBLINGUAL | 3 refills | 1 days | Status: CP | PRN
Start: 2019-01-05 — End: 2020-01-05
  Filled 2019-01-07: qty 100, 20d supply, fill #0

## 2019-01-05 MED ORDER — METRONIDAZOLE 500 MG TABLET
ORAL_TABLET | Freq: Three times a day (TID) | ORAL | 0 refills | 14 days | Status: CP
Start: 2019-01-05 — End: 2019-01-19
  Filled 2019-01-05: qty 42, 14d supply, fill #0

## 2019-01-05 MED ORDER — ATORVASTATIN 80 MG TABLET
ORAL_TABLET | Freq: Every day | ORAL | 3 refills | 90 days | Status: CP
Start: 2019-01-05 — End: 2020-01-05
  Filled 2019-03-07: qty 90, 90d supply, fill #0

## 2019-01-05 MED ORDER — CARVEDILOL 25 MG TABLET
ORAL_TABLET | Freq: Two times a day (BID) | ORAL | 3 refills | 90.00000 days | Status: CP
Start: 2019-01-05 — End: ?
  Filled 2019-01-07: qty 270, 90d supply, fill #0

## 2019-01-05 MED ORDER — TICAGRELOR 90 MG TABLET
ORAL_TABLET | ORAL | 3 refills | 0 days | Status: CP
Start: 2019-01-05 — End: 2020-01-05
  Filled 2019-03-07: qty 180, 90d supply, fill #0

## 2019-01-05 MED ORDER — AMLODIPINE 5 MG TABLET
ORAL_TABLET | Freq: Every day | ORAL | 3 refills | 90 days | Status: CP
Start: 2019-01-05 — End: 2020-01-05
  Filled 2019-01-07: qty 90, 90d supply, fill #0

## 2019-01-05 MED ORDER — ISOSORBIDE MONONITRATE ER 30 MG TABLET,EXTENDED RELEASE 24 HR
ORAL_TABLET | Freq: Every day | ORAL | 3 refills | 30 days | Status: CP
Start: 2019-01-05 — End: ?
  Filled 2019-01-26: qty 120, 30d supply, fill #0

## 2019-01-05 MED ORDER — GABAPENTIN 300 MG CAPSULE
ORAL_CAPSULE | Freq: Two times a day (BID) | ORAL | 3 refills | 90.00000 days | Status: CP
Start: 2019-01-05 — End: 2019-02-04

## 2019-01-05 MED ORDER — SPS (WITH SORBITOL) 15 GRAM-20 GRAM/60 ML ORAL SUSPENSION
ORAL | 3 refills | 60 days | Status: CP
Start: 2019-01-05 — End: ?

## 2019-01-05 MED ORDER — SEMAGLUTIDE 0.25 MG OR 0.5 MG (2 MG/1.5 ML) SUBCUTANEOUS PEN INJECTOR
SUBCUTANEOUS | 3 refills | 84.00000 days | Status: CP
Start: 2019-01-05 — End: 2020-01-05
  Filled 2019-01-27: qty 4.5, 84d supply, fill #0

## 2019-01-05 MED ORDER — BUMETANIDE 2 MG TABLET
ORAL_TABLET | Freq: Every day | ORAL | 3 refills | 90 days | Status: CP
Start: 2019-01-05 — End: 2020-01-05
  Filled 2019-01-19: qty 172, 86d supply, fill #0

## 2019-01-05 MED ORDER — AMIODARONE 200 MG TABLET
ORAL_TABLET | ORAL | 3 refills | 0 days | Status: CP
Start: 2019-01-05 — End: 2020-01-05
  Filled 2019-03-07: qty 90, 90d supply, fill #0

## 2019-01-05 MED ORDER — FLUOXETINE 40 MG CAPSULE
ORAL_CAPSULE | Freq: Every day | ORAL | 3 refills | 90 days | Status: CP
Start: 2019-01-05 — End: 2020-01-05
  Filled 2019-03-07: qty 180, 90d supply, fill #0

## 2019-01-05 MED ORDER — SEVELAMER CARBONATE 800 MG TABLET
ORAL_TABLET | 3 refills | 0 days | Status: CP
Start: 2019-01-05 — End: 2020-01-05
  Filled 2019-01-26: qty 540, 90d supply, fill #0

## 2019-01-05 MED ORDER — OMEPRAZOLE 40 MG CAPSULE,DELAYED RELEASE
ORAL_CAPSULE | Freq: Two times a day (BID) | ORAL | 3 refills | 90 days | Status: CP
Start: 2019-01-05 — End: ?
  Filled 2019-01-07: qty 180, 90d supply, fill #0

## 2019-01-05 MED ORDER — INSULIN DETEMIR (U-100) 100 UNIT/ML (3 ML) SUBCUTANEOUS PEN
Freq: Every evening | SUBCUTANEOUS | 3 refills | 37.00000 days | Status: CP
Start: 2019-01-05 — End: ?
  Filled 2019-01-07: qty 15, 37d supply, fill #0

## 2019-01-05 MED FILL — METRONIDAZOLE 500 MG TABLET: 14 days supply | Qty: 42 | Fill #0 | Status: AC

## 2019-01-05 NOTE — Unmapped (Signed)
Pt called because they had received msg from our office for a disconnected monitor. The family is currently homeless and does not have the means to transmit at this time. They are looking to be settled within the next 60 days. Canceled next remote appointment to give some leeway between remotes.

## 2019-01-05 NOTE — Unmapped (Signed)
Medications:  Continue doxycycline as prescribed  Start Flagyl 500mg  three times a day    I am ordering an X-ray of your foot.  We will call you about the wound clinic

## 2019-01-05 NOTE — Unmapped (Signed)
[facility] - [facility]  Established Visit    Reason for Visit:  Follow-up for Foot Pain (left foot blister on bottom of foot)      1. Ulcer of left foot with fat layer exposed (CMS-[name])    2. Cellulitis of left lower extremity        Assessment/Plans:  1. Ulcer of left foot with fat layer exposed (CMS-[name]) and Cellulitis of left lower extremity  Has had 2 ED visits since 12/23/18.  Ulcer seems to have gotten worse.  Unclear if area of erythema related to use of neosporin rather than true cellulitis as erythema does not surround the ulceration and no drainage from ulceration.  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 Regarding abx treatment, has just completed 10 day course of keflex.  Currently on Doxycycline for past 24-48 hours.  Add on coverage for anerobes given his diabetes.   - XR Foot 3 Or More Views Left; Future to evaluate for osteomyelitis  - metroNIDAZOLE (FLAGYL) 500 MG tablet; Take 1 tablet (500 mg total) by mouth Three (3) times a day for 14 days.  Dispense: 42 tablet; Refill: 0  - Ambulatory referral to Wound Clinic; Future - have discussed with Dr. Daisey Must who will work to get patient added on for this Friday 10/9        Med Refills: sent new prescriptions for all of his chronic medications to [facility] pharmacy per his request.    Return in about 2 weeks (around 01/19/2019).     Medication adherence and barriers to the treatment plan have been addressed. Opportunities to optimize healthy behaviors have been discussed. Patient / caregiver voiced understanding.        __________________________________________________________    HPI:    Two recent ED visits  #1 [facility] 12/23/18 for blister on bottom of left foot. Thought to be related to brace that he wears for his foot drop. XR foot showed no osteomyelitis.  Was d/ced from ED and prescribed Keflex 500mg  QID x 10 days. Just finished last dose today/yesterday per wife.  States that blister came off several days ago and has since ulcerated    #2 [place] in [place] 12/31/18. Was there visiting grandson.  Presented due to worsening redness along medial aspect of left foot. Started on doxycycline. Has been putting neosporin on it.     Since that time, states that redness has remained the same.  Not worse, but not better.  Entire foot is swollen.  Not currently wearing brace for fear that it would make it worse.  Using cane now so that he doesn't fall.      Also has ulceration on bottom of 5th digit of left foot.      No fevers or night sweats. No N/V      ROS:  Pertinent items are noted in HPI.    Patient Active Problem List   Diagnosis   ??? CAD (coronary artery disease) (RAF-[name])   ??? Idiopathic chronic gout of left knee   ??? Hypercholesteremia   ??? Essential hypertension (RAF-[name])   ??? 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 Osteoarthritis   ??? Chronic nonalcoholic liver disease   ??? GERD (gastroesophageal reflux disease)   ??? Mild intermittent asthma without complication   ???  Chronic pain syndrome   ??? Paroxysmal atrial fibrillation (CMS-[name])   ??? OSA (obstructive sleep apnea)   ??? Tobacco use disorder   ??? Status post cardiac pacemaker procedure   ??? Diabetes mellitus with gastroparesis (CMS-[name])   ??? Cardiac pacemaker in situ   ??? Recurrent major depressive disorder, in full remission (CMS-[name])   ??? NSTEMI (non-ST elevated myocardial infarction) (CMS-[name])   ??? Adjustment disorder, unspecified   ??? Chronic heart failure with reduced ejection fraction and diastolic dysfunction (CMS-[name])   ??? Hyperkalemia   ??? CKD (chronic kidney disease) stage 4, GFR 15-29 ml/min (CMS-[name])   ??? Acquired left foot drop   ??? Decreased sensation of lower extremity, left   ??? Chronic anticoagulation   ??? Type 2 diabetes mellitus with stage 4 chronic kidney disease, with long-term current use of insulin (CMS-[name])   ??? Situational stress   ??? Ulcer of left foot with fat layer exposed (CMS-[name])       Medications:  Reviewed in EPIC      Physical Exam:   Vital Signs:   BP 163/95 Comment: omron avg - Pulse 79 Comment: omron avg - Temp 36.7 ??C (98.1 ??F)  - Resp 20  - Wt (!) 156.1 kg (344 lb 1.6 oz)  - SpO2 100%  - BMI 43.01 kg/m??   Gen: Obese, NAD  CV: RRR, no murmurs  Pulm: CTA bilaterally, no crackles or wheezes  Skin: large ulcer on center of left sole.  Patch of erythema extending from ulceration up medial aspect of foot toward dorsum but does not surround the ulcer. No active drainage of the ulcer.

## 2019-01-05 NOTE — Unmapped (Signed)
.  Omron BPs  BP#1 166/95  BP#2  160/92  BP#3   163/98    Average BP     163/95

## 2019-01-05 NOTE — Unmapped (Signed)
CCM FUP:      Spoke with pnt and his wife to make aware he has an appointment this Friday at [facility] (Podiatry). Pnt and his wife were asked to be at clinic by 0915. Pnt stated he was going to the beach today. Stressed to pnt this appointment was important to attend because of the condition of his foot. Wife agreed to have pnt at appt. No questions or concerns.

## 2019-01-05 NOTE — Unmapped (Signed)
CCM Call:      Spoke with charge nurse at [facility] wound clinic concerning pnt's foot ulcers and need for wound evaluation. Per nurse no immediate openings. She stated she would speak with providers and get back to me today with update.

## 2019-01-06 DIAGNOSIS — M1A0621 Idiopathic chronic gout, left knee, with tophus (tophi): Secondary | ICD-10-CM

## 2019-01-06 DIAGNOSIS — I2510 Atherosclerotic heart disease of native coronary artery without angina pectoris: Secondary | ICD-10-CM

## 2019-01-06 DIAGNOSIS — L97522 Non-pressure chronic ulcer of other part of left foot with fat layer exposed: Secondary | ICD-10-CM

## 2019-01-06 DIAGNOSIS — I5042 Chronic combined systolic (congestive) and diastolic (congestive) heart failure: Secondary | ICD-10-CM

## 2019-01-06 DIAGNOSIS — M150 Primary generalized (osteo)arthritis: Secondary | ICD-10-CM

## 2019-01-06 NOTE — Unmapped (Signed)
I was the supervising physician in the delivery of the service. Tiyah Zelenak D Joslynne Klatt, MD

## 2019-01-06 NOTE — Unmapped (Signed)
CCM intake    Pt's wife, Jeremy Oliver, calling CCM -states will get pt to Podiatry appt tomorrow prior to driving to the beach to bring their daughter and family some food.    Wife states they are living out of their car.  Pt states they Decook be able to get a trailer soon, for $650 a month.    CCM left vm for [facility] program for the homeless:  [place] rental assistance program that should be opening soon and has all [place] included in the [employer] funded program.    Pt's wife states DSS never helps them.  Pt does have Medicaid.    Pt confirms is taking both axs, doxy and flagyl. Is also applying topical ax . Covers foot during the day, OTA @ night.   Will firm up tx plan w/ Podiatrist tomorrow.      CCM will reach out to SW for any available assistance re: pt, wife and dog living out of car.

## 2019-01-07 ENCOUNTER — Encounter
Admit: 2019-01-07 | Discharge: 2019-01-08 | Payer: BLUE CROSS/BLUE SHIELD | Attending: Foot & Ankle Surgery | Primary: Foot & Ankle Surgery

## 2019-01-07 DIAGNOSIS — L97522 Non-pressure chronic ulcer of other part of left foot with fat layer exposed: Secondary | ICD-10-CM

## 2019-01-07 MED ORDER — DOXYCYCLINE HYCLATE 100 MG TABLET
ORAL_TABLET | Freq: Two times a day (BID) | ORAL | 0 refills | 14 days | Status: CP
Start: 2019-01-07 — End: 2019-01-21
  Filled 2019-01-07: qty 28, 14d supply, fill #0

## 2019-01-07 MED ORDER — CEPHALEXIN 500 MG CAPSULE
ORAL_CAPSULE | Freq: Three times a day (TID) | ORAL | 0 refills | 14.00000 days | Status: CP
Start: 2019-01-07 — End: 2019-01-21
  Filled 2019-01-07: qty 42, 14d supply, fill #0

## 2019-01-07 MED ORDER — SODIUM HYPOCHLORITE 0.125 % SOLUTION
Freq: Once | TOPICAL | 0 refills | 0 days | Status: CP
Start: 2019-01-07 — End: 2019-01-07

## 2019-01-07 MED FILL — NITROGLYCERIN 0.4 MG SUBLINGUAL TABLET: 20 days supply | Qty: 100 | Fill #0 | Status: AC

## 2019-01-07 MED FILL — DOXYCYCLINE HYCLATE 100 MG TABLET: 14 days supply | Qty: 28 | Fill #0 | Status: AC

## 2019-01-07 MED FILL — CEPHALEXIN 500 MG CAPSULE: 14 days supply | Qty: 42 | Fill #0 | Status: AC

## 2019-01-07 MED FILL — CARVEDILOL 25 MG TABLET: 90 days supply | Qty: 270 | Fill #0 | Status: AC

## 2019-01-07 MED FILL — AMLODIPINE 5 MG TABLET: 90 days supply | Qty: 90 | Fill #0 | Status: AC

## 2019-01-07 MED FILL — OMEPRAZOLE 40 MG CAPSULE,DELAYED RELEASE: 90 days supply | Qty: 180 | Fill #0 | Status: AC

## 2019-01-07 MED FILL — LEVEMIR FLEXTOUCH U-100 INSULIN 100 UNIT/ML (3 ML) SUBCUTANEOUS PEN: 37 days supply | Qty: 15 | Fill #0 | Status: AC

## 2019-01-07 MED FILL — ALLOPURINOL 100 MG TABLET: 40 days supply | Qty: 60 | Fill #0 | Status: AC

## 2019-01-07 NOTE — Unmapped (Signed)
New Patient Wound Clinic Note    Requesting Attending Physician :  Margarito Liner Heis*  Service Requesting Consult : No service for patient encounter.    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  Peripheral neuropathy discussed and treatment options discussed.  Ulcer care discussed.    Diabetic shoes were discussed and purpose of offloading to prevent foot ulcerations.   Patient was encouraged to call with any questions or concerns, if any problems the patient is to contact the office.    Procedure: left foot, Wound Location plantar foot and 5th toe At this time patient's wound was debrided due to the increase in Devitalized tissue of epidermis/dermis, exudate, callus tissue, and other necrotic tissue impairing proper wound healing as reported in the Objective. All benefits, risks, and alternatives were explained in detail with the patient. A time out was taken prior to the procedure. The area was prepped in standard clinic fashion. The wound was anesthetized withlidocaine topical 4% for 15 minute prior to debridement.. An excisional Sharp, surgical debridement was performed to fully debride and remove all central devitalized and necrotic tissue including subcutaneous tissue. The debridement occurred to the level of subcutaneous tissue until  bleeding granulation tissue was observed in 100% of the wound bed.  This was achieved with the use of a curette, forceps and scalpel. Blood loss was minimal.  Hemostasis was achieved with pressure and the post-debridement measurements are documented in the chart. Patient tolerated procedure well at this time.       WOUND CARE INSTRUCTIONS:        Wash area with Antibacterial liquid soap with one wash cloth, clean the soap off with a clean cloth, and then pat dry with a dry cloth before the dressing change.    Dressing: Silvergel DSD with Dakins cleanse       If you have any questions or concerns regarding your wound or wound care, please contact us at the [facility] clinic at [phone].    Discussed patient deformities today which lead to an uneven weight distribution and increased pressure points on the feet. Discussed the etiology of the deformities. These deformities can lead to hard callus and pre-ulcerative conditions. These are the precursors of foot ulcerations in the presence of neuropathy. Patient has a condition in which previous conditions has lead to the loss of mechanical function of multiple joints and/or a disruption in the weightbearing surface. Patient as this time has a uneven distribution of weight onto areas that  lead to breakdown. Their orthopedic status and need to offload, build in custom modifications to re-distribute the weight is indicated. This is in the form of left lower extremity AFO with the diabetic accommodative insert with offloading third metatarsal due to the shearing force and plantarflexed third metatarsal.  And also prominent left fourth fifth toe.    Patient also needs diabetic inserts for the right foot due to significant diabetic neuropathy.        Prescription provided for custom  inserts and molded shoes   The patient could not be fit properly with a prefabricated shoes or inserts. The condition necessitating the arthrosis is expected be permanent or of long-standing duration more than 6 months. There is a need to offload the deformities of bilateral feet. The patient has documented orthopedic status that requires custom fabricating over a model as prefabricated to prevent tissue injury, further deformation or ulceration    Antibiotics were extended up to 4 weeks       Subjective: Jeremy Oliver  Is being seen for an initial visit with wounds. Patient on doxycyline and keflex. Patient utilizing triple antibiotic cream. Patient states the wound is much improving at this time. However, feels that the brace was the biggest issue in why he broke down. Patient sugars are semi controlled. He is doing okay today. Denies any constitutional infection symtpoms     PMH:    Allergies:  Allergies   Allergen Reactions   ??? Losartan      Hyperkalemia (K>6)         Medications:   Outpatient Encounter Medications as of 01/07/2019   Medication Sig Dispense Refill ??? acetaminophen (TYLENOL) 500 MG tablet TAKE 2 TABLETS (1 000MG ) BY MOUTH EVERY 8 HOURS AS NEEDED FOR PAIN (Patient taking differently: Take 500 mg by mouth every four (4) hours as needed. ) 180 each 3   ??? albuterol HFA 90 mcg/actuation inhaler Inhale 2 puffs into the lungs every 6 (six) hours as needed for wheezing or shortness of breath. 18 g 2   ??? alirocumab (PRALUENT) 150 mg/mL subcutaneous injection Inject 1 mL (150 mg total) under the skin every fourteen (14) days. 4 Syringe 11   ??? allopurinoL (ZYLOPRIM) 100 MG tablet Take 1.5 tablets (150 mg total) by mouth daily. 60 tablet 3   ??? amiodarone (PACERONE) 200 MG tablet TAKE 1 TABLET BY MOUTH ONCE DAILY 90 tablet 3   ??? amLODIPine (NORVASC) 5 MG tablet Take 1 tablet (5 mg total) by mouth daily. 90 tablet 3   ??? arm brace Misc 1 each by Miscellaneous route daily. 1 each 0   ??? atorvastatin (LIPITOR) 80 MG tablet Take 1 tablet (80 mg total) by mouth daily. 90 tablet 3   ??? blood sugar diagnostic Strp Disp to match current ins approved meter. Use to test blood sugar four times daily. E11.22, Z79.4 400 each 3   ??? blood-glucose meter kit Disp. blood glucose meter kit preferred by patient's insurance. Dx: Diabetes, E11.9 1 each 11   ??? bumetanide (BUMEX) 2 MG tablet Take 1 tablet (2 mg total) by mouth daily. 90 tablet 3   ??? carvediloL (COREG) 25 MG tablet Take 1.5 tablets (37.5 mg total) by mouth Two (2) times a day. 270 tablet 3   ??? [EXPIRED] cephalexin (KEFLEX) 500 MG capsule Take 1 capsule (500 mg total) by mouth Four (4) times a day for 10 days. 40 capsule 0   ??? cephalexin (KEFLEX) 500 MG capsule Take 1 capsule (500  mg total) by mouth Three (3) times a day for 14 days. 42 capsule 0   ??? cholecalciferol, vitamin D3, 5,000 unit capsule Take 1 capsule (5,000 Units total) by mouth daily. 100 capsule 0   ??? diclofenac sodium (VOLTAREN) 1 % gel Apply 2 grams topically Three (3) times a day. 100 g 11 ??? doxycycline (ADOXA) 100 MG tablet Take 1 tablet by mouth 2 times a day for 10 days 20 tablet 0   ??? doxycycline (VIBRA-TABS) 100 MG tablet Take 1 tablet (100 mg total) by mouth Two (2) times a day for 14 days. 28 tablet 0   ??? empty container Misc Use as directed to dispose of injectable medications 1 each 3   ??? ezetimibe (ZETIA) 10 mg tablet TAKE 1 TABLET BY MOUTH ONCE DAILY 90 each 3   ??? FLUoxetine (PROZAC) 40 MG capsule Take 2 capsules (80 mg total) by mouth daily. 180 capsule 3   ??? gabapentin (NEURONTIN) 300 MG capsule Take 1 capsule (300 mg total) by mouth two (2) times a day. 180 capsule 3   ??? insulin ASPART (NOVOLOG FLEXPEN U-100 INSULIN) 100 unit/mL (3 mL) injection pen Inject 0.2 mL (20 Units total) under the skin three (3) times a day with a meal. 15 mL 11   ??? insulin detemir U-100 (LEVEMIR) 100 unit/mL (3 mL) injection pen Inject 40 Units total under the skin nightly. 15 mL 3   ??? insulin syringe-needle U-100 0.3 mL 31 gauge x 5/16 Syrg Use to inject insulin twice daily with meals 60 each 0   ??? isosorbide mononitrate (IMDUR) 30 MG 24 hr tablet Take 4 tablets (120 mg total) by mouth daily. 120 tablet 3   ??? lancets Misc Disp. Ins preferred lancets. Use to test blood sugar four times daily. E11.22, Z79.4 400 each 3   ??? loperamide (IMODIUM) 2 mg capsule Take 1 capsule (2 mg total) by mouth 4 (four) times a day as needed for diarrhea. 30 capsule 0   ??? metroNIDAZOLE (FLAGYL) 500 MG tablet Take 1 tablet (500 mg total) by mouth Three (3) times a day for 14 days. 42 tablet 0   ??? nitroglycerin (NITROSTAT) 0.4 MG SL tablet Place 1 tablet (0.4 mg total) under the tongue every five (5) minutes as needed for chest pain. 100 each 3   ??? omeprazole (PRILOSEC) 40 MG capsule Take 1 capsule (40 mg total) by mouth Two (2) times a day (30 minutes before a meal). 180 capsule 3   ??? ondansetron (ZOFRAN) 4 MG tablet Take 1 tablet (4 mg total) by mouth every eight (8) hours as needed for nausea for up to 4 days. 10 tablet 0 ??? rivaroxaban (XARELTO) 15 mg Tab Take 1 tablet (15 mg total) by mouth daily with evening meal. 90 tablet 3   ??? semaglutide 0.25 mg or 0.5 mg(2 mg/1.5 mL) PnIj Inject 0.5 mg under the skin every seven (7) days. 4.5 mL 3   ??? sevelamer (RENVELA) 800 mg tablet TAKE 2 TABLETS (1600MG ) BY MOUTH THREE TIMES DAILY WITH MEALS 540 tablet 3   ??? [EXPIRED] sodium hypochlorite (DAKIN'S, QUARTER STRENGTH,) 0.125 % Soln Apply topically as directed 1 Bottle 0   ??? sodium polystyrene, SPS, with sorbitol (SPS, WITH SORBITOL,) 15-20 gram/60 mL Susp Take 60 mL (15 g) by mouth once a week. 473 mL 3   ??? ticagrelor (BRILINTA) 90 mg Tab TAKE 1 TABLET BY MOUTH TWICE DAILY 180 tablet 3   ??? [EXPIRED] traMADoL (ULTRAM) 50 mg tablet  Take 1 tablet (50 mg total) by mouth every six (6) hours as needed for pain. 15 tablet 0   ??? UNIFINE PENTIPS 31 gauge x 5/16 Ndle USE WITH INSULIN AND VICTOZA 5 TIMES DAILY 400 each 3   ??? [DISCONTINUED] liraglutide (VICTOZA 2-PAK) 0.6 mg/0.1 mL (18 mg/3 mL) injection Inject 0.1 mL (0.6 mg total) under the skin daily. 3 mL 1   ??? [DISCONTINUED] ranolazine (RANEXA) 500 MG 12 hr tablet Take by mouth two (2) times a day. 60 tablet 0   ??? [DISCONTINUED] traZODone (DESYREL) 50 MG tablet Take 1 tablet (50 mg total) by mouth nightly. 90 tablet 3     No facility-administered encounter medications on file as of 01/07/2019.        Medical History:  Past Medical History:   Diagnosis Date   ??? Angina at rest (CMS-[name])    ??? Arthritis    ??? Asthma    ??? Atrial fibrillation and flutter (CMS-[name]) 06/08/14   ??? BMI 40.0-44.9, adult (CMS-[name]) 10/29/2015   ??? Chronic anticoagulation 02/17/2018   ??? Chronic kidney disease, stage 3    ??? Chronic pain syndrome 04/11/2014    ~Use daily lyrica as recommended ~Tramadol as needed for pain ~Fluoxetine daily as recommended ~Daily exercise ~Eat a healthy diet ~Good control of your blood sugars can help    ??? Coronary artery disease    ??? Depression    ??? Diabetes mellitus (CMS-[name])    ??? Eye trauma 1998 glass in both eyes and removed   ??? GERD (gastroesophageal reflux disease)    ??? Gout    ??? Homeless    ??? Hyperlipidemia    ??? Hypertension    ??? Illiteracy and low-level literacy    ??? Microscopic hematuria    ??? Migraine    ??? Nephrolithiasis    ??? 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 Nonalcoholic fatty liver disease    ??? NSTEMI (non-ST elevated myocardial infarction) (CMS-[name]) 01/13/2017   ??? Obesity    ??? Obstructive sleep apnea 2009   ??? Panic attacks    ??? 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 Seizure-like activity (CMS-[name])     ~Monitor for symptoms and report them to your primary care provider if they occur    ??? Systolic CHF, chronic (CMS-[name])     EF 40-45% 2017   ??? TIA (transient ischemic attack)        Surgical History:  Past Surgical History:   Procedure Laterality Date   ??? CARDIAC CATHETERIZATION  03/08/2007   ??? CORONARY STENT PLACEMENT     ??? CYSTOURETHROSCOPY  12/31/2009     Microscopic hematuria   ??? KNEE SURGERY Right 09/23/2006    arthroscopic knee surgery medial and lateral meniscus tears as well as crystal disease   ??? PR CATH PLACE/CORON ANGIO, IMG SUPER/INTERP,R&L HRT CATH, L HRT VENTRIC N/A 02/13/2017    Procedure: Left/Right Heart Catheterization;  Surgeon: Marlaine Hind, MD;  Location: [facility] CATH;  Service: Cardiology   ??? PR CATH PLACE/CORON ANGIO, IMG SUPER/INTERP,W LEFT HEART VENTRICULOGRAPHY N/A 03/17/2017  Procedure: Left Heart Catheterization W Intervention;  Surgeon: Marlaine Hind, MD;  Location: [facility] CATH;  Service: Cardiology ??? PR CATH PLACE/CORON ANGIO, IMG SUPER/INTERP,W LEFT HEART VENTRICULOGRAPHY N/A 05/12/2018    Procedure: CATH LEFT HEART CATHETERIZATION W INTERVENTION;  Surgeon: Dorathy Kinsman, MD;  Location: [facility] CATH;  Service: Cardiology   ??? PR CATH PLACE/CORON ANGIO, IMG SUPER/INTERP,W LEFT HEART VENTRICULOGRAPHY N/A 10/22/2018    Procedure: CATH LEFT HEART CATHETERIZATION W INTERVENTION;  Surgeon: Marlaine Hind, MD;  Location: [facility] CATH;  Service: Cardiology   ??? PR ECMO/ECLS INITIATION VENO-ARTERIAL N/A 03/19/2017    Procedure: ECMO/ECLS; INITIATION, VENO-ARTERIAL;  Surgeon: Lennie Odor, MD;  Location: MAIN OR [facility];  Service: Cardiac Surgery   ??? PR ECMO/ECLS RMVL PRPH CANNULA OPEN 6 YRS & OLDER Left 03/25/2017    Procedure: ECMO / ECLS PROVIDED BY PHYSICIAN; REMOVAL OF PERIPHERAL (ARTERIAL AND/OR VENOUS) CANNULA(E), OPEN, 6 YEARS AND OLDER;  Surgeon: Alonna Buckler Ikonomidis, MD;  Location: MAIN OR [facility];  Service: Cardiac Surgery   ??? PR EPHYS EVAL W/ ABLATION SUPRAVENT ARRHYTHMIA N/A 07/19/2015    Catheter ablation of cavotricuspid isthmus for atrial flutter [facility])   ??? PR INSER HART PACER XVENOUS ATRIAL N/A 05/30/2015    [employer] dual-chamber pacemaker implant (E.Chung)   ??? PR INSERT/PLACE FLOW DIRECT CATH N/A 03/17/2017    Procedure: Insert Leave In [place];  Surgeon: Marlaine Hind, MD;  Location: [facility] CATH;  Service: Cardiology   ??? PR INSJ/RPLCMT PERM DFB W/TRNSVNS LDS 1/DUAL CHMBR N/A 05/04/2017    Procedure: ICD Implant System (Single/Dual);  Surgeon: Eldred Manges, MD;  Location: [facility] CATH;  Service: Cardiology   ??? PR NEGATIVE PRESSURE WOUND THERAPY DME >50 SQ CM Left 03/25/2017    Procedure: Neg Press Wound Tx (Vac Assist) Incl Topicals, Per Session, Tsa Greater Than/= 50 Cm Squared;  Surgeon: Alonna Buckler Ikonomidis, MD;  Location: MAIN OR [facility];  Service: Cardiac Surgery   ??? PR PRQ TRLUML CORONARY STENT W/ANGIO ONE ART/BRNCH N/A 03/12/2018 Procedure: Percutaneous Coronary Intervention;  Surgeon: Marlaine Hind, MD;  Location: [facility] CATH;  Service: Cardiology   ??? PR REBL VES DIRECT,LOW EXTREM Left 03/19/2017    Procedure: Repr Bld Vessel Direct; Lower Extrem;  Surgeon: Boykin Reaper, MD;  Location: MAIN OR [facility];  Service: Vascular   ??? PR REMV ART CLOT ILIAC-POP,LEG INCIS Left 03/25/2017    Procedure: EMBOLECTOMY OR THROMBECTOMY, WITH OR WITHOUT CATHETER; FEMOROPOPLITEAL, AORTOILIAC ARTERY, BY LEG INCISION;  Surgeon: Alonna Buckler Ikonomidis, MD;  Location: MAIN OR [facility];  Service: Cardiac Surgery   ??? PR UPPER GI ENDOSCOPY,BIOPSY N/A 02/28/2016    Procedure: UGI ENDOSCOPY; WITH BIOPSY, SINGLE OR MULTIPLE;  Surgeon: Liane Comber, MD;  [facility]: [facility];  Service: Gastroenterology   ??? PR UPPER GI ENDOSCOPY,DIAGNOSIS N/A 05/07/2016    Procedure: UGI ENDO, INCLUDE ESOPHAGUS, STOMACH, & DUODENUM &/OR JEJUNUM; DX W/WO COLLECTION SPECIMN, BY BRUSH OR WASH;  Surgeon: Modena Nunnery, MD;  [facility]: [facility];  Service: Gastroenterology       Social History:  Social History     Socioeconomic History   ??? Marital status: Married     Spouse name: [name]   ??? Number of children: 3   ??? Years of education: 38   ??? Highest education level: None   Occupational History   ??? Occupation: DISABLED     Comment: Former Surveyor, quantity Needs   ??? Financial resource strain: Very hard   ??? Food  insecurity     Worry: Sometimes true     Inability: Sometimes true   ??? Transportation needs     Medical: Yes     Non-medical: Yes   Tobacco Use   ??? Smoking status: Never Smoker   ??? Smokeless tobacco: Current User     Types: Chew   ??? Tobacco comment: Pt in process of reducing tobacco use,  dips 1 can every 2 weeks,   Substance and Sexual Activity   ??? Alcohol use: Never     Alcohol/week: 0.0 standard drinks     Frequency: Never     Comment: rare use: couple times a year, small amount   ??? Drug use: No   ??? Sexual activity: Yes Partners: Female   Lifestyle   ??? Physical activity     Days per week: None     Minutes per session: None   ??? Stress: None   Relationships   ??? Social Wellsite geologist on phone: More than three times a week     Gets together: More than three times a week     Attends religious service: 1 to 4 times per year     Active member of club or organization: No     Attends meetings of clubs or organizations: Never     Relationship status: Married   Other Topics Concern   ??? Do you use sunscreen? No   ??? Tanning bed use? No   ??? Are you easily burned? No   ??? Excessive sun exposure? No   ??? Blistering sunburns? No   Social History Narrative    12/01/18: pt living with wife and friends. Friends have two young children. [name] states soon they will have their own place, however doesn't know when. Pt states it will be after the neighbors are evicted, we can move in. Pt will need to start dialysis soon. Pt and wife Not talking with daughter and grandson lately.             Information updated:  01/08/16. Reviewed 08/12/17:        [place]    Raised with both parents    Parents died w/in 6 mos of each other. His parents knew they were going to pass and told him just before.    Half sister and 2 half brothers        Married 30 yrs. Wife [name]    2 other children son and daughter, adopted out    Daughter Bridgette, bf DJ        Education - completed through 11th. 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    Former Presenter, broadcasting since age 11    Lives with wife, difficulty paying bills 12/30/17     Lives next door to daughter, husband and their grandson        05/19/18: pt moving to new home in next 5 days- after an eviction. Has Medicaid assistance again. Will be w/ wife [name], they babysit their grandson often.    06/14/18: Pt and husband living with daughter while new location to live is determined.       Family History: Family History   Problem Relation Age of Onset   ??? Diabetes Mother    ??? Hyperlipidemia Mother    ???  Heart disease Mother    ??? Basal cell carcinoma Mother    ??? Blindness Mother         diabeties   ??? Obesity Mother    ??? Hyperlipidemia Father    ??? Heart disease Father    ??? Lung disease Father    ??? Heart disease Half-Sister    ??? Kidney disease Half-Sister    ??? Gallbladder disease Half-Brother    ??? Obesity Half-Brother    ??? No Known Problems Daughter    ??? No Known Problems Son    ??? Obesity Daughter    ??? Melanoma Neg Hx    ??? Squamous cell carcinoma Neg Hx    ??? Macular degeneration Neg Hx    ??? Glaucoma Neg Hx                      Review of Systems:   CONSTITUTIONAL:  Patient denies fevers, chills, night sweats and weight changes.  HEENT: Patient denies any visual symptoms.  No difficulties with hearing.  No symptoms of rhinitis or sore throat.  CARDIOVASCULAR:  Patient denies chest pains, palpitations, orthopnea   RESPIRATORY:  No dyspnea on exertion, no wheezing or cough.  GI:  No nausea, vomiting, diarrhea, constipation, abdominal pain.  MUSCULOSKELETAL:  Left foot drop and weakness  NEUROLOGIC:  No chronic headaches, no seizures.  Patient admits to numbness, and denies tingling or weakness.  DERMATOLOGIC:  Patient Admits to foot wounds        Left foot    OBJECTIVE:        Constitutional :   - BP (P) 197/94  - Pulse (P) 82  - Temp 36.7 ??C (98 ??F)   ?? Patient Appears:  Adequately developed, Well nourished, Well groomed    Psychiatric: Alert and Oriented to Person, Place, and Time. Pt is Cooperative, Pleasant, and Understanding    Cardiovascular: Dorsalis and Posterior Tibial pulses were palpable bilateral Lower extremity measured as a 2/4, minor edema noted to the lower extremities bilateral. Integumentary: Skin is Positive for ulcerations at this time located at left foot. Wound measurements are noted below.  Thick eschar devitalized tissue present in the plantar aspect of the foot swelling and erythema appreciated skin is of reduced appropriate texture and turgor bilateral lower extremity. No subcutaneous nodules or cystic type lesions observed to both lower extremities.     Neurologic: 10 Point SWMF testing was negative to all ten points tested to both feet. Vibratory Sensation is negative to the bilateral lower extremities.       Musculoskeletal: General appearance of the right and left LE reveals the following deformities: Foot drop left. Gait Examination reveals 3/5 MMT to dorsiflexion and eversion left lower extremity 5 out of 5 to all muscle groups on the lower extremity on the right.Marland Kitchen ROM was reduced to the ankle but adequate to pedal joints.               Wound 01/07/19 Foot plantar left  (Active)   Wound Image   01/07/19 0912   Wound Status Not Healed 01/07/19 0912   Pain Insensate 01/07/19 0912   Dressing Status      No dressing 01/07/19 0912   Wound Length (cm) 2.3 cm 01/07/19 0912   Wound Width (cm) 2.4 cm 01/07/19 0912   Wound Depth (cm) 0.1 cm 01/07/19 0912   Wound Surface Area (cm^2) 5.52 cm^2 01/07/19 0912   Wound Volume (cm^3) 0.55 cm^3 01/07/19 0912   Wound  Bed Dull;Brown 01/07/19 0912   Odor None 01/07/19 0912   Margins Defined edges 01/07/19 0912   Peri-wound Assessment      Edema 01/07/19 0912   Encounter Initial 01/07/19 0912   Progress Initial Exam 01/07/19 0912   Slough % None 01/07/19 0912   Eschar % 76-100% 01/07/19 0912   Epithelialization % None 01/07/19 0912   Granulation % None 01/07/19 0912   Exudate Amnt      None 01/07/19 0912   Thickness Eschar Covered 01/07/19 0912   Exposed Structure N/A 01/07/19 0912   Texture Edema 01/07/19 0912   Moisture Dry/Scaly 01/07/19 0912   Temperature WNL 01/07/19 0912   Hypergranuation No 01/07/19 0912   Tunneling      No 01/07/19 0912 Undermining     No 01/07/19 0912   Sinus Tract No 01/07/19 0912   Treatments Cleansed/Irrigation 01/07/19 0912   Picture Taken Yes 01/07/19 0912   Length (Pre Debridement) 2.3 cm 01/07/19 0912   Width (Pre Debridement) 2.4 cm 01/07/19 0912   Depth (Pre Debridement) 0.1 cm 01/07/19 0912   Pre-Procedure Pain 0 01/07/19 0912   Time Out Taken Yes 01/07/19 0912   Instrument Used scalpel;curette 01/07/19 0912   Tissue/Material Removed non-viable;callus 01/07/19 0912   Bleeding moderate 01/07/19 0912   Bleeding controlled with pressure 01/07/19 0912   Specimen Taken none 01/07/19 0912   Type of Debridement surgical 01/07/19 0912   Level of Debridement skin, epidermis 01/07/19 0912   Procedural Pain 0 01/07/19 0912   Length (Post-Debridement) 2.3 cm 01/07/19 0912   Width (Post Debridement) 2.4 cm 01/07/19 0912   Depth (Post-Debridement) 0.1 cm 01/07/19 0912   Post Procedural Pain 0 01/07/19 0912   Area(Pre-Debridement) 5.52 sq cm 01/07/19 0912   Area(Post-Debridement) 5.52 sq cm 01/07/19 0912   Volume(Pre-Debridement) 0.55 sq cm 01/07/19 0912   Volume(Post-Debridement) 0.55 sq cm 01/07/19 0912       Wound 01/07/19 Toe (Comment which one) left 5th toe (Active)   Wound Status Not Healed 01/07/19 0912   Pain Insensate 01/07/19 0912   Dressing Status      No dressing 01/07/19 0912   Wound Length (cm) 1.3 cm 01/07/19 0912   Wound Width (cm) 1.4 cm 01/07/19 0912   Wound Depth (cm) 0.1 cm 01/07/19 0912   Wound Surface Area (cm^2) 1.82 cm^2 01/07/19 0912   Wound Volume (cm^3) 0.18 cm^3 01/07/19 0912   Wound Bed Brown;Dull 01/07/19 0912   Odor None 01/07/19 0912   Margins Defined edges 01/07/19 0912   Peri-wound Assessment      Dry;Edema 01/07/19 0912   Encounter Initial 01/07/19 0912   Progress Initial Exam 01/07/19 0912   Slough % None 01/07/19 0912   Eschar % 76-100% 01/07/19 0912   Epithelialization % None 01/07/19 0912   Granulation % None 01/07/19 0912   Exudate Amnt      None 01/07/19 0912 Thickness Full Thickness 01/07/19 0912   Exposed Structure N/A 01/07/19 0912   Texture Edema 01/07/19 0912   Moisture Dry/Scaly 01/07/19 0912   Temperature WNL 01/07/19 0912   Hypergranuation No 01/07/19 0912   Tunneling      No 01/07/19 0912   Undermining     No 01/07/19 0912   Sinus Tract No 01/07/19 0912   Treatments Cleansed/Irrigation 01/07/19 0912   Picture Taken Yes 01/07/19 0912   Length (Pre Debridement) 1.3 cm 01/07/19 0912   Width (Pre Debridement) 1.4 cm 01/07/19 0912   Depth (Pre Debridement) 0.1  cm 01/07/19 0912   Pre-Procedure Pain 0 01/07/19 0912   Time Out Taken Yes 01/07/19 0912   Instrument Used scalpel;curette 01/07/19 0912   Tissue/Material Removed non-viable;callus;slough 01/07/19 0912   Bleeding moderate 01/07/19 0912   Bleeding controlled with pressure 01/07/19 0912   Specimen Taken none 01/07/19 0912   Type of Debridement surgical 01/07/19 0912   Level of Debridement skin, dermis 01/07/19 0912   Procedural Pain 0 01/07/19 0912   Length (Post-Debridement) 1.3 cm 01/07/19 0912   Width (Post Debridement) 1.4 cm 01/07/19 0912   Depth (Post-Debridement) 0.1 cm 01/07/19 0912   Post Procedural Pain 0 01/07/19 0912   Area(Pre-Debridement) 1.82 sq cm 01/07/19 0912   Area(Post-Debridement) 1.82 sq cm 01/07/19 0912   Volume(Pre-Debridement) 0.18 sq cm 01/07/19 0912   Volume(Post-Debridement) 0.18 sq cm 01/07/19 0912

## 2019-01-07 NOTE — Unmapped (Addendum)
Purchase short cam boot and evenup from [email or web address]              Three Step    Wound care:    1)  Use one tsp hibiclens mixed with 2 cups water and spray over wound then gently wipe clean   2)  1 cup white distilled vinegar to 3 cups distilled water - mix and soak on gauze and leave over the wound for 5 minutes (if this burns do 1 cup vinegar to 4 cups of water.)   3)  Then Spray Anasept and/or Dakins to the wound bed and surrounding area and let dry, or apply to a gauze pad then apply to the wound for 5 minutes, before applying the dressing.    WOUND CARE INSTRUCTIONS:   GO TO THE ER IF THE RED REACHES THE MARKER POINT        Wash area with Antibacterial liquid soap with one wash cloth, clean the soap off with a clean cloth, and then pat dry with a dry cloth before the dressing change.    Dressing:     Left Plantar: Wet to dry dakins gauze against the wound bed, secure with kerlix and ace wrap     Left fifth toe:Wet to dry dakins gauze against the wound bed, secure with kerlix and ace wrap       If you have any questions or concerns regarding your wound or wound care, please contact us at the [facility] clinic at [phone].

## 2019-01-07 NOTE — Unmapped (Signed)
Pt's wife called.  Returned call to wife and [name].  States he has an ulcer on the bottom of his foot and pinky toe.  Pt's wife states that he has been instructed to not bear weight on foot at this time.  He is having a new boot made for his foot also.  We discussed requirements of cardiac rehab.  They are going to speak with physician about when he will be able to participate in cardiac rehab and will call us back when that date is established.  Pt and wife agree with plan.

## 2019-01-11 MED FILL — DICLOFENAC 1 % TOPICAL GEL: 17 days supply | Qty: 100 | Fill #8 | Status: AC

## 2019-01-11 MED FILL — DICLOFENAC 1 % TOPICAL GEL: TOPICAL | 17 days supply | Qty: 100 | Fill #8

## 2019-01-13 ENCOUNTER — Ambulatory Visit: Admit: 2019-01-13 | Discharge: 2019-01-13 | Disposition: A | Discharge status: Home / Self Care | Payer: MEDICARE

## 2019-01-13 ENCOUNTER — Emergency Department: Admit: 2019-01-13 | Discharge: 2019-01-13 | Disposition: A | Discharge status: Home / Self Care | Payer: MEDICARE

## 2019-01-13 DIAGNOSIS — M7989 Other specified soft tissue disorders: Principal | ICD-10-CM

## 2019-01-13 LAB — CBC W/ AUTO DIFF
BASOPHILS ABSOLUTE COUNT: 0.1 10*9/L (ref 0.0–0.1)
BASOPHILS RELATIVE PERCENT: 0.6 %
EOSINOPHILS ABSOLUTE COUNT: 0.3 10*9/L (ref 0.0–0.4)
EOSINOPHILS RELATIVE PERCENT: 3.9 %
HEMATOCRIT: 31.8 % — ABNORMAL LOW (ref 41.0–53.0)
HEMOGLOBIN: 10.3 g/dL — ABNORMAL LOW (ref 13.5–17.5)
LARGE UNSTAINED CELLS: 1 % (ref 0–4)
LYMPHOCYTES ABSOLUTE COUNT: 1.6 10*9/L (ref 1.5–5.0)
MEAN CORPUSCULAR HEMOGLOBIN CONC: 32.6 g/dL (ref 31.0–37.0)
MEAN CORPUSCULAR HEMOGLOBIN: 26.6 pg (ref 26.0–34.0)
MEAN CORPUSCULAR VOLUME: 81.6 fL (ref 80.0–100.0)
MEAN PLATELET VOLUME: 7.6 fL (ref 7.0–10.0)
MONOCYTES ABSOLUTE COUNT: 0.4 10*9/L (ref 0.2–0.8)
MONOCYTES RELATIVE PERCENT: 4.9 %
NEUTROPHILS ABSOLUTE COUNT: 5.7 10*9/L (ref 2.0–7.5)
NEUTROPHILS RELATIVE PERCENT: 69.5 %
PLATELET COUNT: 289 10*9/L (ref 150–440)
RED CELL DISTRIBUTION WIDTH: 16.5 % — ABNORMAL HIGH (ref 12.0–15.0)

## 2019-01-13 LAB — COMPREHENSIVE METABOLIC PANEL
ALBUMIN: 2.9 g/dL — ABNORMAL LOW (ref 3.5–5.0)
ALT (SGPT): 9 U/L (ref <50)
ANION GAP: 8 mmol/L (ref 7–15)
AST (SGOT): 14 U/L — ABNORMAL LOW (ref 19–55)
BILIRUBIN TOTAL: 0.2 mg/dL (ref 0.0–1.2)
BLOOD UREA NITROGEN: 28 mg/dL — ABNORMAL HIGH (ref 7–21)
BUN / CREAT RATIO: 8
CALCIUM: 8.5 mg/dL (ref 8.5–10.2)
CHLORIDE: 111 mmol/L — ABNORMAL HIGH (ref 98–107)
CO2: 22 mmol/L (ref 22.0–30.0)
CREATININE: 3.43 mg/dL — ABNORMAL HIGH (ref 0.70–1.30)
EGFR CKD-EPI AA MALE: 23 mL/min/{1.73_m2} — ABNORMAL LOW (ref >=60)
EGFR CKD-EPI NON-AA MALE: 20 mL/min/{1.73_m2} — ABNORMAL LOW (ref >=60)
GLUCOSE RANDOM: 261 mg/dL — ABNORMAL HIGH (ref 70–179)
POTASSIUM: 5.4 mmol/L — ABNORMAL HIGH (ref 3.5–5.0)
PROTEIN TOTAL: 5.8 g/dL — ABNORMAL LOW (ref 6.5–8.3)
SODIUM: 141 mmol/L (ref 135–145)

## 2019-01-13 LAB — MAGNESIUM: Magnesium:MCnc:Pt:Ser/Plas:Qn:: 1.7

## 2019-01-13 LAB — PRO-BNP: Natriuretic peptide.B prohormone N-Terminal:MCnc:Pt:Ser/Plas:Qn:: 2290 — ABNORMAL HIGH

## 2019-01-13 LAB — MONOCYTES RELATIVE PERCENT: Monocytes/100 leukocytes:NFr:Pt:Bld:Qn:Automated count: 4.9

## 2019-01-13 LAB — TROPONIN I: Troponin I.cardiac:MCnc:Pt:Ser/Plas:Qn:: <0.06

## 2019-01-13 LAB — LIPASE: Triacylglycerol lipase:CCnc:Pt:Ser/Plas:Qn:: 227

## 2019-01-13 LAB — PROTEIN TOTAL: Protein:MCnc:Pt:Ser/Plas:Qn:: 5.8 — ABNORMAL LOW

## 2019-01-13 NOTE — Unmapped (Signed)
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 male with PMH of asthma, Afib (on Xarelto), CKD, CAD, NSTEMI, DM with peripheral neuropathy, HLD, HTN, nonalcoholic fatty liver disease, obesity, OSA, CHF (EF 35%), and TIA presenting with 2 days of feet and ankle swelling, L>R, in the setting of a sleeping in car with feet in dependent position with associated SOB.  No chest pain. No infectious symptoms. Patient currently on anbibiotics for known diabetic foot ulcer currently being treated as an outpatient.     Patient is nontoxic appearing, VS notable for BP 209/99, afebrile and in NAD. On exam, 2+ pitting edema to BLE. Stage 2 ulcer to lower aspect of left foot, foul-smelling drainage.    DDx includes CHF exacerbation, given BLE swelling with associated SOB in the setting of missed bumex vs dependent edema vs less likely DVT, given bilateral swelling and no pain, although neuropathy at baseline. Plan for EKG, CXR, and cardiac labs.     ED Course as of Jan 13 2305   Thu Jan 13, 2019   1635 CBC is generally unremarkable.  White blood cell count 8.2.  H/H 10.3/31.8.  Plat count 289.  CMP is generally remarkable.  Potassium 5.4.  Creatinine 3.43, BUN 28 stably elevated.  Troponin negative. EKG without signs of acute ischemia or underlying arrhythmia.       1800 CXR unremarkable. Discussed the importance of taking medications including bumex with patient. Patient discharged with return precautions and follow up instruction. Patient instructed to have his PCP recheck his K in 24 hours. We discussed symptom control measures and return precautions Additional Medical Decision Making     I have reviewed the vital signs and the nursing notes. Labs and radiology results that were available during my care of the patient were independently reviewed by me and considered in my medical decision making.     I staffed the case with the ED attending, Dr. Alvino Chapel.    I independently visualized the EKG tracing.   I independently visualized the radiology images.   I reviewed the patient's prior medical records (Podiatry note from 01/07/19).     Portions of this record have been created using Scientist, clinical (histocompatibility and immunogenetics). Dictation errors have been sought, but Kabler not have been identified and corrected.  ____________________________________________       History     Chief Complaint  Shortness of Breath      HPI   Jeremy Oliver is a 48 y.o. male with PMH of asthma, Afib (on Xarelto), CKD, CAD, NSTEMI, DM with peripheral neuropathy, HLD, HTN, nonalcoholic fatty liver disease, obesity, OSA, CHF (EF 35%), and TIA presenting with SOB and lower extremity edema, primarily in his feet and ankles, over the last 2 days. He reports mild SOB currently. His swelling is worst in his left foot than his right. He has not had swelling like this previously. He was seen by Podiatry for an ulcer to his left foot on 01/07/19 at which time his wound was debrided and he was started on Keflex and doxycycline. He has been taking these medications. He does not  report pain in his legs or feet, but notes that he does not have much feeling at baseline due to peripheral neuropathy. He takes PO Bumex 2 mg every day and has missed 2 doses recently but is otherwise compliant. He reports that he is homeless and has been sleeping in his car for some time noting that he is unable to elevate his legs while asleep.. No recent illness. Denies leg pain, chest pain, abdominal pain, nausea, vomiting, diarrhea, cough, congestion, rhinorrhea, sore throat, or fatigue.     Echocardiogram from 10/19/18 showed: ?? Dilated left ventricle - mild  ?? Moderately decreased left ventricular systolic function, ejection fraction 35%  ?? Segmental wall motion abnormality - severe posterolateral hypokinesis  ?? Mitral regurgitation - mild  ?? Dilated left atrium - moderate  ?? Aortic sclerosis  ?? Normal right ventricular systolic function  ?? Technically difficult study due to chest wall/lung interference  ?? Ultrasound enhancing agent utilized to improve endocardial border definition  ?? Diastolic dysfunction - grade I (normal filling pressures)    Past Medical History:   Diagnosis Date   ??? Angina at rest (CMS-[name])    ??? Arthritis    ??? Asthma    ??? Atrial fibrillation and flutter (CMS-[name]) 06/08/14   ??? BMI 40.0-44.9, adult (CMS-[name]) 10/29/2015   ??? Chronic anticoagulation 02/17/2018   ??? Chronic kidney disease, stage 3    ??? Chronic pain syndrome 04/11/2014    ~Use daily lyrica as recommended ~Tramadol as needed for pain ~Fluoxetine daily as recommended ~Daily exercise ~Eat a healthy diet ~Good control of your blood sugars can help    ??? Coronary artery disease    ??? Depression    ??? Diabetes mellitus (CMS-[name])    ??? Eye trauma 1998    glass in both eyes and removed   ??? GERD (gastroesophageal reflux disease)    ??? Gout    ??? Homeless    ??? Hyperlipidemia    ??? Hypertension    ??? Illiteracy and low-level literacy    ??? Microscopic hematuria    ??? Migraine    ??? Nephrolithiasis    ??? 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 Nonalcoholic fatty liver disease    ??? NSTEMI (non-ST elevated myocardial infarction) (CMS-[name]) 01/13/2017 ??? Obesity    ??? Obstructive sleep apnea 2009   ??? Panic attacks    ??? 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 Seizure-like activity (CMS-[name])     ~Monitor for symptoms and report them to your primary care provider if they occur    ??? Systolic CHF, chronic (CMS-[name])     EF 40-45% 2017   ??? TIA (transient ischemic attack)        Patient Active Problem List   Diagnosis   ??? Idiopathic chronic gout of left knee   ??? Hypercholesteremia   ??? Essential hypertension (RAF-[name])   ??? 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 Osteoarthritis   ??? Chronic nonalcoholic liver disease   ??? GERD (gastroesophageal reflux disease)   ???  Mild intermittent asthma without complication   ??? Chronic pain syndrome   ??? Paroxysmal atrial fibrillation (CMS-[name])   ??? OSA (obstructive sleep apnea)   ??? Tobacco use disorder   ??? Status post cardiac pacemaker procedure   ??? Diabetes mellitus with gastroparesis (CMS-[name])   ??? Cardiac pacemaker in situ   ??? Recurrent major depressive disorder, in full remission (CMS-[name])   ??? NSTEMI (non-ST elevated myocardial infarction) (CMS-[name])   ??? Adjustment disorder, unspecified   ??? Hyperkalemia   ??? CKD (chronic kidney disease) stage 4, GFR 15-29 ml/min (CMS-[name])   ??? Acquired left foot drop   ??? Decreased sensation of lower extremity, left   ??? Chronic anticoagulation   ??? Type 2 diabetes mellitus with stage 4 chronic kidney disease, with long-term current use of insulin (CMS-[name])   ??? Situational stress   ??? Ulcer of left foot with fat layer exposed (CMS-[name])       Past Surgical History:   Procedure Laterality Date   ??? CARDIAC CATHETERIZATION  03/08/2007   ??? CORONARY STENT PLACEMENT     ??? CYSTOURETHROSCOPY  12/31/2009     Microscopic hematuria   ??? KNEE SURGERY Right 09/23/2006    arthroscopic knee surgery medial and lateral meniscus tears as well as crystal disease   ??? PR CATH PLACE/CORON ANGIO, IMG SUPER/INTERP,R&L HRT CATH, L HRT VENTRIC N/A 02/13/2017 Procedure: Left/Right Heart Catheterization;  Surgeon: Marlaine Hind, MD;  Location: [facility] CATH;  Service: Cardiology   ??? PR CATH PLACE/CORON ANGIO, IMG SUPER/INTERP,W LEFT HEART VENTRICULOGRAPHY N/A 03/17/2017    Procedure: Left Heart Catheterization W Intervention;  Surgeon: Marlaine Hind, MD;  Location: [facility] CATH;  Service: Cardiology   ??? PR CATH PLACE/CORON ANGIO, IMG SUPER/INTERP,W LEFT HEART VENTRICULOGRAPHY N/A 05/12/2018    Procedure: CATH LEFT HEART CATHETERIZATION W INTERVENTION;  Surgeon: Dorathy Kinsman, MD;  Location: [facility] CATH;  Service: Cardiology   ??? PR CATH PLACE/CORON ANGIO, IMG SUPER/INTERP,W LEFT HEART VENTRICULOGRAPHY N/A 10/22/2018    Procedure: CATH LEFT HEART CATHETERIZATION W INTERVENTION;  Surgeon: Marlaine Hind, MD;  Location: [facility] CATH;  Service: Cardiology   ??? PR ECMO/ECLS INITIATION VENO-ARTERIAL N/A 03/19/2017    Procedure: ECMO/ECLS; INITIATION, VENO-ARTERIAL;  Surgeon: Lennie Odor, MD;  Location: MAIN OR [facility];  Service: Cardiac Surgery   ??? PR ECMO/ECLS RMVL PRPH CANNULA OPEN 6 YRS & OLDER Left 03/25/2017    Procedure: ECMO / ECLS PROVIDED BY PHYSICIAN; REMOVAL OF PERIPHERAL (ARTERIAL AND/OR VENOUS) CANNULA(E), OPEN, 6 YEARS AND OLDER;  Surgeon: Alonna Buckler Ikonomidis, MD;  Location: MAIN OR [facility];  Service: Cardiac Surgery   ??? PR EPHYS EVAL W/ ABLATION SUPRAVENT ARRHYTHMIA N/A 07/19/2015    Catheter ablation of cavotricuspid isthmus for atrial flutter [facility])   ??? PR INSER HART PACER XVENOUS ATRIAL N/A 05/30/2015    [employer] dual-chamber pacemaker implant (E.Chung)   ??? PR INSERT/PLACE FLOW DIRECT CATH N/A 03/17/2017    Procedure: Insert Leave In [place];  Surgeon: Marlaine Hind, MD;  Location: [facility] CATH;  Service: Cardiology   ??? PR INSJ/RPLCMT PERM DFB W/TRNSVNS LDS 1/DUAL CHMBR N/A 05/04/2017    Procedure: ICD Implant System (Single/Dual);  Surgeon: Eldred Manges, MD;  Location: [facility] CATH;  Service: Cardiology ??? PR NEGATIVE PRESSURE WOUND THERAPY DME >50 SQ CM Left 03/25/2017    Procedure: Neg Press Wound Tx (Vac Assist) Incl Topicals, Per Session, Tsa Greater Than/= 50 Cm Squared;  Surgeon: Alonna Buckler Ikonomidis, MD;  Location: MAIN OR [facility];  Service: Cardiac Surgery   ???  PR PRQ TRLUML CORONARY STENT W/ANGIO ONE ART/BRNCH N/A 03/12/2018    Procedure: Percutaneous Coronary Intervention;  Surgeon: Marlaine Hind, MD;  Location: [facility] CATH;  Service: Cardiology   ??? PR REBL VES DIRECT,LOW EXTREM Left 03/19/2017    Procedure: Repr Bld Vessel Direct; Lower Extrem;  Surgeon: Boykin Reaper, MD;  Location: MAIN OR [facility];  Service: Vascular   ??? PR REMV ART CLOT ILIAC-POP,LEG INCIS Left 03/25/2017    Procedure: EMBOLECTOMY OR THROMBECTOMY, WITH OR WITHOUT CATHETER; FEMOROPOPLITEAL, AORTOILIAC ARTERY, BY LEG INCISION;  Surgeon: Alonna Buckler Ikonomidis, MD;  Location: MAIN OR [facility];  Service: Cardiac Surgery   ??? PR UPPER GI ENDOSCOPY,BIOPSY N/A 02/28/2016    Procedure: UGI ENDOSCOPY; WITH BIOPSY, SINGLE OR MULTIPLE;  Surgeon: Liane Comber, MD;  [facility]: [facility];  Service: Gastroenterology   ??? PR UPPER GI ENDOSCOPY,DIAGNOSIS N/A 05/07/2016    Procedure: UGI ENDO, INCLUDE ESOPHAGUS, STOMACH, & DUODENUM &/OR JEJUNUM; DX W/WO COLLECTION SPECIMN, BY BRUSH OR WASH;  Surgeon: Modena Nunnery, MD;  [facility]: [facility];  Service: Gastroenterology       No current facility-administered medications for this encounter.     Current Outpatient Medications:   ???  acetaminophen (TYLENOL) 500 MG tablet, TAKE 2 TABLETS (1 000MG ) BY MOUTH EVERY 8 HOURS AS NEEDED FOR PAIN (Patient taking differently: Take 500 mg by mouth every four (4) hours as needed. ), Disp: 180 each, Rfl: 3  ???  albuterol HFA 90 mcg/actuation inhaler, Inhale 2 puffs into the lungs every 6 (six) hours as needed for wheezing or shortness of breath., Disp: 18 g, Rfl: 2 ???  alirocumab (PRALUENT) 150 mg/mL subcutaneous injection, Inject 1 mL (150 mg total) under the skin every fourteen (14) days., Disp: 4 Syringe, Rfl: 11  ???  allopurinoL (ZYLOPRIM) 100 MG tablet, Take 1.5 tablets (150 mg total) by mouth daily., Disp: 60 tablet, Rfl: 3  ???  amiodarone (PACERONE) 200 MG tablet, TAKE 1 TABLET BY MOUTH ONCE DAILY, Disp: 90 tablet, Rfl: 3  ???  amLODIPine (NORVASC) 5 MG tablet, Take 1 tablet (5 mg total) by mouth daily., Disp: 90 tablet, Rfl: 3  ???  arm brace Misc, 1 each by Miscellaneous route daily., Disp: 1 each, Rfl: 0  ???  atorvastatin (LIPITOR) 80 MG tablet, Take 1 tablet (80 mg total) by mouth daily., Disp: 90 tablet, Rfl: 3  ???  blood sugar diagnostic Strp, Disp to match current ins approved meter. Use to test blood sugar four times daily. E11.22, Z79.4, Disp: 400 each, Rfl: 3  ???  blood-glucose meter kit, Disp. blood glucose meter kit preferred by patient's insurance. Dx: Diabetes, E11.9, Disp: 1 each, Rfl: 11  ???  bumetanide (BUMEX) 2 MG tablet, Take 1 tablet (2 mg total) by mouth daily., Disp: 90 tablet, Rfl: 3  ???  carvediloL (COREG) 25 MG tablet, Take 1.5 tablets (37.5 mg total) by mouth Two (2) times a day., Disp: 270 tablet, Rfl: 3  ???  cephalexin (KEFLEX) 500 MG capsule, Take 1 capsule (500 mg total) by mouth Three (3) times a day for 14 days., Disp: 42 capsule, Rfl: 0  ???  cholecalciferol, vitamin D3, 5,000 unit capsule, Take 1 capsule (5,000 Units total) by mouth daily., Disp: 100 capsule, Rfl: 0  ???  diclofenac sodium (VOLTAREN) 1 % gel, Apply 2 grams topically Three (3) times a day., Disp: 100 g, Rfl: 11  ???  doxycycline (ADOXA) 100 MG tablet, Take 1 tablet by mouth 2 times a  day for 10 days, Disp: 20 tablet, Rfl: 0  ???  doxycycline (VIBRA-TABS) 100 MG tablet, Take 1 tablet (100 mg total) by mouth Two (2) times a day for 14 days., Disp: 28 tablet, Rfl: 0  ???  empty container Misc, Use as directed to dispose of injectable medications, Disp: 1 each, Rfl: 3 ???  ezetimibe (ZETIA) 10 mg tablet, TAKE 1 TABLET BY MOUTH ONCE DAILY, Disp: 90 each, Rfl: 3  ???  FLUoxetine (PROZAC) 40 MG capsule, Take 2 capsules (80 mg total) by mouth daily., Disp: 180 capsule, Rfl: 3  ???  gabapentin (NEURONTIN) 300 MG capsule, Take 1 capsule (300 mg total) by mouth two (2) times a day., Disp: 180 capsule, Rfl: 3  ???  insulin ASPART (NOVOLOG FLEXPEN U-100 INSULIN) 100 unit/mL (3 mL) injection pen, Inject 0.2 mL (20 Units total) under the skin three (3) times a day with a meal., Disp: 15 mL, Rfl: 11  ???  insulin detemir U-100 (LEVEMIR) 100 unit/mL (3 mL) injection pen, Inject 40 Units total under the skin nightly., Disp: 15 mL, Rfl: 3  ???  insulin syringe-needle U-100 0.3 mL 31 gauge x 5/16 Syrg, Use to inject insulin twice daily with meals, Disp: 60 each, Rfl: 0  ???  isosorbide mononitrate (IMDUR) 30 MG 24 hr tablet, Take 4 tablets (120 mg total) by mouth daily., Disp: 120 tablet, Rfl: 3  ???  lancets Misc, Disp. Ins preferred lancets. Use to test blood sugar four times daily. E11.22, Z79.4, Disp: 400 each, Rfl: 3  ???  loperamide (IMODIUM) 2 mg capsule, Take 1 capsule (2 mg total) by mouth 4 (four) times a day as needed for diarrhea., Disp: 30 capsule, Rfl: 0  ???  metroNIDAZOLE (FLAGYL) 500 MG tablet, Take 1 tablet (500 mg total) by mouth Three (3) times a day for 14 days., Disp: 42 tablet, Rfl: 0  ???  nitroglycerin (NITROSTAT) 0.4 MG SL tablet, Place 1 tablet (0.4 mg total) under the tongue every five (5) minutes as needed for chest pain., Disp: 100 each, Rfl: 3  ???  omeprazole (PRILOSEC) 40 MG capsule, Take 1 capsule (40 mg total) by mouth Two (2) times a day (30 minutes before a meal)., Disp: 180 capsule, Rfl: 3  ???  ondansetron (ZOFRAN) 4 MG tablet, Take 1 tablet (4 mg total) by mouth every eight (8) hours as needed for nausea for up to 4 days., Disp: 10 tablet, Rfl: 0  ???  rivaroxaban (XARELTO) 15 mg Tab, Take 1 tablet (15 mg total) by mouth daily with evening meal., Disp: 90 tablet, Rfl: 3 ???  semaglutide 0.25 mg or 0.5 mg(2 mg/1.5 mL) PnIj, Inject 0.5 mg under the skin every seven (7) days., Disp: 4.5 mL, Rfl: 3  ???  sevelamer (RENVELA) 800 mg tablet, TAKE 2 TABLETS (1600MG ) BY MOUTH THREE TIMES DAILY WITH MEALS, Disp: 540 tablet, Rfl: 3  ???  sodium polystyrene, SPS, with sorbitol (SPS, WITH SORBITOL,) 15-20 gram/60 mL Susp, Take 60 mL (15 g) by mouth once a week., Disp: 473 mL, Rfl: 3  ???  ticagrelor (BRILINTA) 90 mg Tab, TAKE 1 TABLET BY MOUTH TWICE DAILY, Disp: 180 tablet, Rfl: 3  ???  UNIFINE PENTIPS 31 gauge x 5/16 Ndle, USE WITH INSULIN AND VICTOZA 5 TIMES DAILY, Disp: 400 each, Rfl: 3    Allergies  Losartan    Family History   Problem Relation Age of Onset   ??? Diabetes Mother    ??? Hyperlipidemia Mother    ??? Heart  disease Mother    ??? Basal cell carcinoma Mother    ??? Blindness Mother         diabeties   ??? Obesity Mother    ??? Hyperlipidemia Father    ??? Heart disease Father    ??? Lung disease Father    ??? Heart disease Half-Sister    ??? Kidney disease Half-Sister    ??? Gallbladder disease Half-Brother    ??? Obesity Half-Brother    ??? No Known Problems Daughter    ??? No Known Problems Son    ??? Obesity Daughter    ??? Melanoma Neg Hx    ??? Squamous cell carcinoma Neg Hx    ??? Macular degeneration Neg Hx    ??? Glaucoma Neg Hx        Social History  Social History     Tobacco Use   ??? Smoking status: Never Smoker   ??? Smokeless tobacco: Current User     Types: Chew   ??? Tobacco comment: Pt in process of reducing tobacco use,  dips 1 can every 2 weeks,   Substance Use Topics   ??? Alcohol use: Never     Alcohol/week: 0.0 standard drinks     Frequency: Never     Comment: rare use: couple times a year, small amount   ??? Drug use: No       Review of Systems   Constitutional: Negative for fever.  Eyes: Negative for visual changes.  ENT: Negative for sore throat.  Cardiovascular: Positive for leg swelling,   Respiratory: Positive for shortness of breath.  Gastrointestinal: Negative for abdominal pain, vomiting or diarrhea. Genitourinary: Negative for dysuria.   Musculoskeletal: Negative for back pain.  Skin: Positive for wound  Neurological: Negative for headaches, focal weakness or numbness.      Physical Exam     ED Triage Vitals [01/13/19 1400]   Enc Vitals Group      BP 209/99      Pulse       SpO2 Pulse 94      Resp       Temp       Temp Source Oral      SpO2 97 %     Constitutional: Alert and oriented. Well appearing and in no distress.  Eyes: Conjunctivae are normal.  ENT       Head: Normocephalic and atraumatic.       Nose: No congestion.       Mouth/Throat: Mucous membranes are moist.       Neck: No stridor.  Hematological/Lymphatic/Immunilogical: No cervical lymphadenopathy.  Cardiovascular: Normal rate, regular rhythm. Normal and symmetric distal pulses are present in all extremities.  Respiratory: Normal respiratory effort. Breath sounds are normal.  Gastrointestinal: Soft and nontender. There is no CVA tenderness.  Musculoskeletal: Normal range of motion in all extremities. 2+ pitting edema to BLE.   Neurologic: Normal speech and language. No gross focal neurologic deficits are appreciated.  Skin: Stage 2 ulcer to lower aspect of left foot, foul-smelling drainage  Psychiatric: Mood and affect are normal. Speech and behavior are normal.      EKG     Encounter Date: 01/13/19   ECG 12 Lead   Result Value    EKG Systolic BP     EKG Diastolic BP     EKG Ventricular Rate 91    EKG Atrial Rate 91    EKG P-R Interval 198    EKG QRS Duration 86    EKG  Q-T Interval 404    EKG QTC Calculation 496    EKG Calculated P Axis 56    EKG Calculated R Axis -10    EKG Calculated T Axis 118    QTC Fredericia 464    Narrative    NORMAL SINUS RHYTHM  LEFT VENTRICULAR HYPERTROPHY WITH REPOLARIZATION ABNORMALITY  PROLONGED QT  ABNORMAL ECG  WHEN COMPARED WITH ECG OF 04-Dec-2018 07:54,  CRITERIA FOR ANTEROSEPTAL INFARCT ARE NO LONGER PRESENT  Confirmed by Pollyann Kennedy ([id number]) on 01/14/2019 7:20:16 AM       Radiology XR Foot 3 Or More Views Left   Final Result   -No radiographic evidence of osteomyelitis.       XR Chest 2 views   Final Result   No acute airspace disease.          Documentation assistance was provided by Leighton Ruff, Scribe, on January 13, 2019 at 2:12 PM Bea Laura, MD.       January 14, 2019 11:05 PM. Documentation assistance provided by the above mentioned scribe. I was present during the time the encounter was recorded. The information recorded by the scribe was done at my direction and has been reviewed and validated by me.   Bea Laura, MD PGY-3       Fredirick Maudlin, MD  Resident  01/14/19 [id number]

## 2019-01-13 NOTE — Unmapped (Signed)
Please let him know that I have been in communication with Podiatrist that he has seen.  Podiatrist has recommend a different orthotics place in [place].  I will let him know of updates once we have it sorted out.

## 2019-01-13 NOTE — Unmapped (Signed)
Pt ambulatory to triage c/o shortness of breath and LE edema. Pt reports he has been sleeping in his car. Pt reports h/o heart problems. Pt denies pain today.

## 2019-01-13 NOTE — Unmapped (Signed)
Message relayed.

## 2019-01-13 NOTE — Unmapped (Signed)
He is asking for you order a boot for his L foot at [facility] in [place] says the brace is not working.  His # is [phone].

## 2019-01-14 DIAGNOSIS — I2510 Atherosclerotic heart disease of native coronary artery without angina pectoris: Principal | ICD-10-CM

## 2019-01-14 DIAGNOSIS — E1143 Type 2 diabetes mellitus with diabetic autonomic (poly)neuropathy: Principal | ICD-10-CM

## 2019-01-14 DIAGNOSIS — I5042 Chronic combined systolic (congestive) and diastolic (congestive) heart failure: Principal | ICD-10-CM

## 2019-01-14 DIAGNOSIS — M150 Primary generalized (osteo)arthritis: Principal | ICD-10-CM

## 2019-01-14 MED FILL — NOVOLOG FLEXPEN U-100 INSULIN ASPART 100 UNIT/ML (3 ML) SUBCUTANEOUS: SUBCUTANEOUS | 25 days supply | Qty: 15 | Fill #0

## 2019-01-14 MED FILL — NOVOLOG FLEXPEN U-100 INSULIN ASPART 100 UNIT/ML (3 ML) SUBCUTANEOUS: 25 days supply | Qty: 15 | Fill #0 | Status: AC

## 2019-01-14 NOTE — Unmapped (Signed)
Pt's L foot was wrapped per request with gauze and kerlix.

## 2019-01-14 NOTE — Unmapped (Signed)
CCM Follow-up Call    PCP Action:None (pcp appt Wed.)       CCM intake- wife [name] calling ot alert re: pt in ER yesterday.    CCM ED Follow Up    Shawn Route Molla presented to the emergency department on 01/14/2019.    Call attempt: [x] 1 [] 2 [] 3    Internal/External ED visit: [x] Internal     Patient reported reason for ED visit:   Swollen legs r. Leg 3x the left leg  Patient's symptoms are better, worse or the same since discharge: [x] Better [] Worse [] Same   What are the patient's current symptoms:     Patient was able to pick up all newly prescribed medications (and/or any necessary refills): [x] Yes [] No  N/A- pt says nothing new prescribed.    Patient was reminded to bring all medication to their next PCP appointment: yes   Barriers to care: CCM Barriers to Care: Financial insecurity   Patient has been made aware of Urgent Care/same day walk-in hours: yes  Patient has a follow up appointment scheduled with PCP within 7-14 days: yes, Wednesday 10/21 Patient identifies the following individual as their primary point of contact:   Gwen Her RN/CCM    Pt remains homeless, he and wife living out of their car. State they [place] be on a wait list for a hotel per SW call yesterday. Denies need for [facility] SW referral today. Grandson living with his mom in [place].    Will bring pill boxes and med list to appt Wed.    NAD noted.

## 2019-01-14 NOTE — Unmapped (Signed)
Pro-BNP  Order: [id number]  Status:  Final result ????    Sent to provider for review and action

## 2019-01-14 NOTE — Unmapped (Signed)
I was the supervising physician in the delivery of the service. Elysa Womac D Lachrista Heslin, MD

## 2019-01-14 NOTE — Unmapped (Signed)
Patient has been identified as a Runner, broadcasting/film/video.  Requested that a  timely follow-up appointment be scheduled and documented prior to discharge.  Longitudinal Plan of Care reviewed, the following information is noted for continuity of care:       Personal Health Advocate Pool contacted by In Basket message regarding post-acute services and collaboration around plan of care.           Care Management  Initial Transition Planning Assessment              General  Care Manager assessed the patient by : Telephone conversation with family, Medical record review  Orientation Level: Oriented X4  Who provides care at home?: Family member  Reason for referral: Other - specify in comments  Additional Information: ACO Home Safety Screening    Contact/Decision Maker  Primary Emergency Contact: Imm,[name]  Home Phone: [phone]  Relation: Spouse    Type of Residence: Homeless: lives in car at this time  Patient Phone Number: [phone] (home)         Medical Provider(s): Kurtis Bushman, MD  Reason for Admission: Admitting Diagnosis:  No admission diagnoses are documented for this encounter. 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 Past Surgical History:   has a past surgical history that includes Knee surgery (Right, 09/23/2006); Cardiac catheterization (03/08/2007); Cystourethroscopy (12/31/2009 ); pr inser hart pacer xvenous atrial (N/A, 05/30/2015); pr ephys eval w/ ablation supravent arrhythmia (N/A, 07/19/2015); pr upper gi endoscopy,biopsy (N/A, 02/28/2016); pr upper gi endoscopy,diagnosis (N/A, 05/07/2016); pr cath place/coron angio, img super/interp,r&l hrt cath, l hrt ventric (N/A, 02/13/2017); pr cath place/coron angio, img super/interp,w left heart ventriculography (N/A, 03/17/2017); pr insert/place flow direct cath (N/A, 03/17/2017); pr ecmo/ecls initiation veno-arterial (N/A, 03/19/2017); pr rebl ves direct,low extrem (Left, 03/19/2017); pr ecmo/ecls rmvl prph cannula open 6 yrs & older (Left, 03/25/2017); pr remv art clot iliac-pop,leg incis (Left, 03/25/2017); pr negative pressure wound therapy dme >50 sq cm (Left, 03/25/2017); pr insj/rplcmt perm dfb w/trnsvns lds 1/dual chmbr (N/A, 05/04/2017); pr prq trluml coronary stent w/angio one art/brnch (N/A, 03/12/2018); pr cath place/coron angio, img super/interp,w left heart ventriculography (N/A, 05/12/2018); pr cath place/coron angio, img super/interp,w left heart ventriculography (N/A, 10/22/2018); and Coronary stent placement.   Previous admit date: 10/15/2018    Primary Insurance- Payor: MEDICARE / Plan: MEDICARE PART A AND PART B / Product Type: *No Product type* /   Secondary Insurance ??? Secondary Insurance  MEDICAID [place]  Prescription Coverage ??? Medicaid  Preferred Pharmacy - [facility] - [place], [place] - [address]  [facility] [place]  [facility] - [place], [place] - [address]  [facility] - [place] - [address]  [facility] [place]  - [address]  [facility]  [facility]  [address] - [place] ([place]), [place] - [address]    Transportation home: Private vehicle  Level of function prior to admission: Independent    Home Safety Screening  Patient Questions:    1.  Before the illness or injury that brought you to the ED, did you need someone to help you on a regular basis? Do you need people to help you with activities of daily living?  ?? Yes - wife helps as needed.  2.  In the last 24 hours, have you needed more help than usual?  ?? Yes - d/t LE swelling  3.  Have you been hospitalized for one or more nights during the past six months?   ?? Yes - per chart   4.  Have you been to an emergency room one or more times in the past six months?  ?? Yes - per chart  5.  In general, do you have problems with your vision that cannot be corrected with glasses/contacts?  ?? No  6.  In general, do you have problems with your memory?  ?? No  7.  Do you take six or more different medications every day?   ?? Yes - per chart  8. Do you have any trouble getting, taking, keeping up with or paying for your medications?   ?? Yes - pt reports he can't take them himself if his wife doesn't help him  9   Have you had any falls over the past few weeks or are you concerned about falling?  ?? No  10.  Are you having trouble accessing and/or preparing the meals you need?  ?? Yes - d/t financial limitations  11. Do you have wounds or bed ulcers?  ?? Yes - LE swelling, wound from LE brace/boot  12  Do you have access to transportation?    Yes - lives in his car    Care Manager Questions:  Is the patient over the age of 37?  ?? No  *Home Safety Evaluation endorsed  8 of the screening items and   CAMP referral should be considered, home impact assessment should be considered and will update personal health advocate      Legal Next of Kin / Guardian / POA / Advance Directives     [name] (HCPOA): Pech,[name] - [phone]    Patient Information  Lives with: Spouse/significant other Type of Residence: Other, Homeless  Location/Detail: car    Responsibilities/Dependents at home?: No    Home Care services in place prior to admission?: No     Outpatient/Community Resources in place prior to admission: Clinic  Agency detail (Name/Phone #): PCP: Wilhemena Durie Gladman [phone].     Current HME Agency (Name/Phone #): CPAP from [facility] - not using currently because he can't use in the car    Currently receiving outpatient dialysis?: No     Financial Information   Pt receives disability.    Need for financial assistance?: Yes  Type of financial assistance required: Community resources    Social Determinants of Health  Social Determinants of Health were addressed in provider documentation.  Please refer to patient history.    Discharge Needs Assessment  Concerns to be Addressed:  Homelessness. Pt aware of community resources in [place] and [address]    Clinical Risk Factors: Multiple Diagnoses (Chronic), Other (Comment)    Barriers to taking medications: No    Prior overnight hospital stay or ED visit in last 90 days: [facility] ED  visits)    Readmission Within the Last 30 Days: no previous admission in last 30 days, other (see comments)    Anticipated Changes Related to Illness: none    Equipment Needed After Discharge: none(no new needs)    Discharge Facility/Level of Care Needs: (home)    Readmission  Risk of Unplanned Readmission Score:  %  Predictive Model Details   No score data available for [facility] Risk of Unplanned Readmission     Readmitted Within the Last 30 Days? (No if blank)   Patient at risk for readmission?: Yes    Discharge Plan  Screen findings are: Care Manager reviewed the plan of the patient's care with the Multidisciplinary Team. No discharge planning needs identified at this time. Care Manager will continue to manage plan and monitor patient's progress with the team.    Expected Discharge Date: 01/13/2019    Expected Transfer from Critical Care:  N/A Patient and/or family were provided with choice of facilities / services that are available and appropriate to meet post hospital care needs?: N/A     Initial Assessment complete?: Yes      Leafy Ro, LCSW, Inpatient Case Manager, January 13, 2019 5:43 PM

## 2019-01-14 NOTE — Unmapped (Addendum)
The [facility] Personal Health Advocate Pool received a case management referral message for this Value Care Priority patient. Message forwarded via in-basket message to: [facility] Personal Health Advocate Case Manager Reynaldo Minium, RN CCM for follow up with patient.      ----- Message from Mercer Pod, MSW sent at 01/13/2019  5:44 PM EDT -----  Regarding: ACO pt to [facility] ED  This pt came to ED again today with LE swelling from sleeping in his car. He is homeless at this time and unable to use his CPAP in his car. He was being discharged at the time of my phone conversation with him in the ED.    I'm not sure how to help him :( He can't/won't access some services because of his wife, and he can't/won't access others because of his dog.     Mercer Pod, MSW, LCSW, ACM-SW  [facility] Area Management

## 2019-01-15 NOTE — Unmapped (Signed)
Error - duplicate

## 2019-01-17 NOTE — Unmapped (Signed)
Pt called regarding Wound care supply order from [employer] [facility] [employer]. I called and spoke with Clydie Braun from [employer] who said the order was shipped on 10/12. She did say that shipping can take longer d/t being sent to a PO box. Address verified with both pt and [employer] [facility]. No other questions to be addressed at this time.

## 2019-01-17 NOTE — Unmapped (Addendum)
CCM intake:    CCM Follow-up Call    PCP Action:None - appt Wednesday.    CCM Action:f/u medications and wound supplies. (left vm w/ wound clinic [phone])    Patient Action:come to appt WEd. bring meds. continue to care for leg wound.       Follow up/Discuss next call: left leg, pain mngt, HF, meds , living situation.       Pt requesting wound clinic supplies.  Did not receive and wound supply  Gauze,  Wrap, - the Kit tape.  Pt states elevating legs, has been difficult while living n the car but did get to stay in a hotel last night and will tonight.    Pt and wife state pt's FBG is usually 94-130 and during the day it is never > 200.  [name] states pt has all medications and is taking as prescribed. Pt will bring medications to appt Wednseday.    Pt has refused pill packs in the past.    CCM will notify wound clinic re:  When supplies will be shipped to PO Box.  Pt currently paying for his own supplies/expensive from pharmacy.    Pt and wife have been living out of car x3 months.  Wife states difficulty  Being able to rent is d/t the fact she was evicted March 2020.- and this unfortunate information stays on your record for 7 years.  [name] states when they are able to find a place that will rent to them the [facility] DSS will pay for their deposit and first month's rent.     Will notify care managmnt.

## 2019-01-19 ENCOUNTER — Ambulatory Visit: Admit: 2019-01-19 | Discharge: 2019-01-20 | Payer: MEDICARE

## 2019-01-19 DIAGNOSIS — I5042 Chronic combined systolic (congestive) and diastolic (congestive) heart failure: Principal | ICD-10-CM

## 2019-01-19 DIAGNOSIS — L97522 Non-pressure chronic ulcer of other part of left foot with fat layer exposed: Principal | ICD-10-CM

## 2019-01-19 DIAGNOSIS — I10 Essential (primary) hypertension: Principal | ICD-10-CM

## 2019-01-19 MED ORDER — BUMETANIDE 2 MG TABLET: 2 mg | tablet | Freq: Two times a day (BID) | 3 refills | 90 days | Status: AC

## 2019-01-19 MED ORDER — FUROSEMIDE 80 MG TABLET: 80 mg | tablet | Freq: Two times a day (BID) | 0 refills | 30 days | Status: AC

## 2019-01-19 MED FILL — BUMETANIDE 2 MG TABLET: 86 days supply | Qty: 172 | Fill #0 | Status: AC

## 2019-01-19 NOTE — Unmapped (Addendum)
Take Lasix 80mg  twice a day   Come back in 1 week to see Bart for Blood pressure recheck, recheck your weight.     I will arrange an appointment at the Diuresis clinic for Friday if possible for lasix through an IV

## 2019-01-19 NOTE — Unmapped (Signed)
[facility]  Established Visit    Reason for Visit:  Leg swelling, ED f/u     1. Essential hypertension (RAF-[name])    2. Chronic heart failure with reduced ejection fraction and diastolic dysfunction (CMS-[name])    3. Ulcer of left foot with fat layer exposed (CMS-[name])        Assessment/Plans:  1. Essential hypertension (RAF-[name])  Uncontrolled, when previously well controlled and now with 20lb weight gain in 2 weeks.   - bumex changes as noted below, but defer further changes as I am arrange for appt with Diuresis cliinic    2. Chronic heart failure with reduced ejection fraction and diastolic dysfunction (CMS-[name])  Recent ECHO with EF 35%.  Previously well controlled fluid status with Bumex BID.    - increase to Bumex 2mg  BID  - will arrange for Diuresis Clinic appointment    3. Ulcer of left foot with fat layer exposed (CMS-[name])  Appears to be slowly healing, but worry that will have delayed healing due to significant LE swelling.  Continue cleanings and dressing per Dr. Ayesha Rumpf recommendations.       Return in about 2 weeks (around 02/02/2019).     Medication adherence and barriers to the treatment plan have been addressed. Opportunities to optimize healthy behaviors have been discussed. Patient / caregiver voiced understanding.        __________________________________________________________    HPI:    Presented to the ED on 10/17 for LE swelling.  Was thought to be related to dependent edema given homelessness and staying in car with inability to elevate legs.  Labs notable for stably elevated K, Cr that was slightly improved from prior at 3.43, low albumin, and elevated ProBNP to 2300.  Troponin was negative.      States that legs continue to be swollen.  Reports that he is taking all of his medications, not missing any doses. Uses Pill box. Left foot ulcer is getting better.  Wife is helping with dressing and cleanings that were recommended by Dr. Daisey Must.  Awaiting new brace after he gets new diabetic shoes.    Same foot got lots of ant bites after accidentally stepping on ant hill.      Having lots of stress in life from homelessness, worrying about health of wife and safety of daughter and grandson who have moved to [place].  Feels that has lead to his elevated BP.      ROS:  Pertinent items are noted in HPI.    Patient Active Problem List   Diagnosis   ??? Idiopathic chronic gout of left knee   ??? Hypercholesteremia   ??? Essential hypertension (RAF-[name])   ??? 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 Osteoarthritis   ??? Chronic nonalcoholic liver disease   ??? GERD (gastroesophageal reflux disease)   ??? Mild intermittent asthma without complication   ??? Chronic pain syndrome   ??? Paroxysmal atrial fibrillation (CMS-[name])   ??? OSA (obstructive sleep apnea)   ??? Tobacco use disorder   ??? Status post cardiac pacemaker procedure   ??? Diabetes mellitus with gastroparesis (CMS-[name])   ??? Cardiac pacemaker in situ   ??? Recurrent major depressive disorder, in full remission (CMS-[name])   ??? NSTEMI (non-ST elevated myocardial infarction) (CMS-[name])   ??? Adjustment disorder, unspecified   ??? Hyperkalemia   ??? CKD (chronic kidney disease) stage 4, GFR 15-29 ml/min (CMS-[name])   ???  Acquired left foot drop   ??? Decreased sensation of lower extremity, left   ??? Chronic anticoagulation   ??? Type 2 diabetes mellitus with stage 4 chronic kidney disease, with long-term current use of insulin (CMS-[name])   ??? Situational stress   ??? Ulcer of left foot with fat layer exposed (CMS-[name])       Medications:  Reviewed in EPIC      Physical Exam:   Vital Signs:   BP 160/87 Comment: omron avg - Pulse 71 Comment: omrn avg - Temp 36.7 ??C (98.1 ??F)  - Resp 20  - Wt (!) 167.5 kg (369 lb 3.2 oz)  - SpO2 98%  - BMI 46.15 kg/m?? Gen: Well appearing, NAD  CV: RRR, no murmurs  Pulm: CTA bilaterally, no crackles or wheezes  GI: Soft, NTND, normal BS. No HSM.  MSK: No edema

## 2019-01-19 NOTE — Unmapped (Signed)
.  Omron BPs  BP#1  163/87  BP#2  158/85  BP#3   159/88    Average BP   160/87

## 2019-01-20 ENCOUNTER — Ambulatory Visit: Admit: 2019-01-20 | Discharge: 2019-01-21 | Payer: MEDICARE | Attending: Adult Health | Primary: Adult Health

## 2019-01-20 DIAGNOSIS — I5042 Chronic combined systolic (congestive) and diastolic (congestive) heart failure: Principal | ICD-10-CM

## 2019-01-20 LAB — MAGNESIUM: Magnesium:MCnc:Pt:Ser/Plas:Qn:: 1.7

## 2019-01-20 LAB — BASIC METABOLIC PANEL
ANION GAP: 3 mmol/L — ABNORMAL LOW (ref 7–15)
BLOOD UREA NITROGEN: 33 mg/dL — ABNORMAL HIGH (ref 7–21)
BUN / CREAT RATIO: 9
CALCIUM: 8.5 mg/dL (ref 8.5–10.2)
CHLORIDE: 109 mmol/L — ABNORMAL HIGH (ref 98–107)
CO2: 27 mmol/L (ref 22.0–30.0)
CREATININE: 3.73 mg/dL — ABNORMAL HIGH (ref 0.70–1.30)
EGFR CKD-EPI AA MALE: 21 mL/min/{1.73_m2} — ABNORMAL LOW (ref >=60)
EGFR CKD-EPI NON-AA MALE: 18 mL/min/{1.73_m2} — ABNORMAL LOW (ref >=60)
POTASSIUM: 5.7 mmol/L — ABNORMAL HIGH (ref 3.5–5.0)
SODIUM: 139 mmol/L (ref 135–145)

## 2019-01-20 LAB — HEPATIC FUNCTION PANEL
ALBUMIN: 3.2 g/dL — ABNORMAL LOW (ref 3.5–5.0)
ALT (SGPT): 9 U/L (ref <50)
AST (SGOT): 14 U/L — ABNORMAL LOW (ref 19–55)
BILIRUBIN TOTAL: 0.5 mg/dL (ref 0.0–1.2)
PROTEIN TOTAL: 6.4 g/dL — ABNORMAL LOW (ref 6.5–8.3)

## 2019-01-20 LAB — PRO-BNP: Natriuretic peptide.B prohormone N-Terminal:MCnc:Pt:Ser/Plas:Qn:: 2550 — ABNORMAL HIGH

## 2019-01-20 LAB — BUN / CREAT RATIO: Urea nitrogen/Creatinine:MRto:Pt:Ser/Plas:Qn:: 9

## 2019-01-20 LAB — ALT (SGPT): Alanine aminotransferase:CCnc:Pt:Ser/Plas:Qn:: 9

## 2019-01-20 NOTE — Unmapped (Signed)
IV Diuresis Clinic Discharge Instructions:    MEDICATIONS:  NO medication changes today.  - CONTINUE Bumex 2mg  twice daily  Call if you have questions about your medications.    IMPORTANT INSTRUCTIONS:  - Weigh yourself daily in the morning; write your weight down and bring it with you next time   - Limit your fluid intake to 2 Liters (half-gallon) per day    - Limit your salt intake to 2-3 grams (2000-3000 mg) per day  - If you have a morning diuresis appointment, don't take your morning diuretic  - If you have an afternoon diuresis appointment, take your morning diuretic    LABS:  We will call you if your labs need attention    NEXT APPOINTMENT:  Return to clinic in 4 days with IV Diuresis - Monday at 10am    My office number (c/o De Burrs RN) is [phone] if you need further assistance, or need to schedule with our IV Diuresis clinic in the future.    If you need to reschedule future appointments, please call [phone] or [phone]. After office hours, if you have urgent questions/problems, contact the on-call cardiologist through the hospital operator: [phone].

## 2019-01-20 NOTE — Unmapped (Signed)
[place] IV Diuresis Clinic Note    Primary Cardiologist/Cardiology Provider(s):  Dr Andrey Farmer    PCP:  Kurtis Bushman, MD    Last Cardiology Clinic Visit:  11/18/2018  Last IV Diuresis Visit: None    Reason for Visit:  Jeremy Oliver is a 48 y.o. male with a history of systolic heart failure, who is being seen today in the [facility] IV Diuresis Clinic for an urgent visit for volume status optimization including IV diuresis.    Assessment/Plan:  1. Acute on Chronic systolic heart failure  - Volume status today is worse.  - IV Lasix 80 mg x2 was administered during clinic with good response  - Labs were obtained: Cr 3.73, K 5.7  - Continue Bumex 2mg  BID    Response to IV diuresis:         Medications Heart rate Blood pressure Weight (lbs)   Pre  78 140/76 365.7   Hour 0 IV Lasix 80 mg      Hour 2 IV Lasix 80 mg      Post  70 160/90   362.1       Output:  I/O       10/20 0701 - 10/21 0700 10/21 0701 - 10/22 0700 10/22 0701 - 10/23 0700    Urine (mL/kg/hr)   1725    Total Output(mL/kg)   1725 (10.5)    Net   -1725                 Return to clinic:    Return in about 4 days (around 01/24/2019) for Diuresis Clinic.    Future Appointments   Date Time Provider Department Center   01/24/2019 10:00 AM DIURESIS HF NP [facility]   01/26/2019  1:45 PM Scot Jun Galliano, [place] [facility]   01/31/2019  1:00 PM Diane R Dolan-Soto, LCSW [facility]   02/02/2019  8:30 AM Karen Chafe, DPM [facility]OD[facility]   03/03/2019  1:30 PM Marlaine Hind, MD [facility]   03/15/2019  9:10 AM Roma Schanz, MD [facility]   04/07/2019  9:30 AM Mila Merry, MD [facility]   04/26/2019  8:15 AM [facility] EP REMOTE MONITORING EPMONITORCH [facility]   06/20/2019  8:30 AM Monica Martinez, MD RHEUMFARR [facility]   06/27/2019  8:30 AM Monica Martinez, MD RHEUMFARR [facility]   07/26/2019  8:15 AM [facility] EP REMOTE MONITORING EPMONITORCH [facility] History of Present Illness:  Jeremy Oliver is a 48 y.o. male with a history of systolic heart failure who presents today for IV diuresis. Patient last seen in clinic by his PCP yesterday. He was volume up on exam and was referred for IV Diuresis. Today, pt states that his weight is up to 365lbs from baseline of about 330lbs. This is likely in context of being homeless and living out of his car; he is eating mostly canned foods, whatever he can get. He has LE edema and bloating. He has a little more DOE than usual. He denies orthopnea, PND.     Past Medical History:   Diagnosis Date   ??? Angina at rest (CMS-[name])    ??? Arthritis    ??? Asthma    ??? Atrial fibrillation and flutter (CMS-[name]) 06/08/14   ??? BMI 40.0-44.9, adult (CMS-[name]) 10/29/2015   ??? Chronic anticoagulation 02/17/2018   ??? Chronic kidney disease, stage 3    ??? Chronic pain  syndrome 04/11/2014    ~Use daily lyrica as recommended ~Tramadol as needed for pain ~Fluoxetine daily as recommended ~Daily exercise ~Eat a healthy diet ~Good control of your blood sugars can help    ??? Coronary artery disease    ??? Depression    ??? Diabetes mellitus (CMS-[name])    ??? Eye trauma 1998    glass in both eyes and removed   ??? GERD (gastroesophageal reflux disease)    ??? Gout    ??? Homeless    ??? Hyperlipidemia    ??? Hypertension    ??? Illiteracy and low-level literacy    ??? Microscopic hematuria    ??? Migraine    ??? Nephrolithiasis    ??? 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 Nonalcoholic fatty liver disease ??? NSTEMI (non-ST elevated myocardial infarction) (CMS-[name]) 01/13/2017   ??? Obesity    ??? Obstructive sleep apnea 2009   ??? Panic attacks    ??? 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 Seizure-like activity (CMS-[name])     ~Monitor for symptoms and report them to your primary care provider if they occur    ??? Systolic CHF, chronic (CMS-[name])     EF 40-45% 2017   ??? TIA (transient ischemic attack)      Past Surgical History:   Procedure Laterality Date   ??? CARDIAC CATHETERIZATION  03/08/2007   ??? CORONARY STENT PLACEMENT     ??? CYSTOURETHROSCOPY  12/31/2009     Microscopic hematuria   ??? KNEE SURGERY Right 09/23/2006    arthroscopic knee surgery medial and lateral meniscus tears as well as crystal disease   ??? PR CATH PLACE/CORON ANGIO, IMG SUPER/INTERP,R&L HRT CATH, L HRT VENTRIC N/A 02/13/2017    Procedure: Left/Right Heart Catheterization;  Surgeon: Marlaine Hind, MD;  Location: [facility] CATH;  Service: Cardiology   ??? PR CATH PLACE/CORON ANGIO, IMG SUPER/INTERP,W LEFT HEART VENTRICULOGRAPHY N/A 03/17/2017    Procedure: Left Heart Catheterization W Intervention;  Surgeon: Marlaine Hind, MD;  Location: [facility] CATH;  Service: Cardiology   ??? PR CATH PLACE/CORON ANGIO, IMG SUPER/INTERP,W LEFT HEART VENTRICULOGRAPHY N/A 05/12/2018    Procedure: CATH LEFT HEART CATHETERIZATION W INTERVENTION;  Surgeon: Dorathy Kinsman, MD;  Location: [facility] CATH;  Service: Cardiology   ??? PR CATH PLACE/CORON ANGIO, IMG SUPER/INTERP,W LEFT HEART VENTRICULOGRAPHY N/A 10/22/2018    Procedure: CATH LEFT HEART CATHETERIZATION W INTERVENTION;  Surgeon: Marlaine Hind, MD;  Location: [facility] CATH;  Service: Cardiology   ??? PR ECMO/ECLS INITIATION VENO-ARTERIAL N/A 03/19/2017    Procedure: ECMO/ECLS; INITIATION, VENO-ARTERIAL;  Surgeon: Lennie Odor, MD;  Location: MAIN OR [facility];  Service: Cardiac Surgery   ??? PR ECMO/ECLS RMVL PRPH CANNULA OPEN 6 YRS & OLDER Left 03/25/2017 Procedure: ECMO / ECLS PROVIDED BY PHYSICIAN; REMOVAL OF PERIPHERAL (ARTERIAL AND/OR VENOUS) CANNULA(E), OPEN, 6 YEARS AND OLDER;  Surgeon: Alonna Buckler Ikonomidis, MD;  Location: MAIN OR [facility];  Service: Cardiac Surgery   ??? PR EPHYS EVAL W/ ABLATION SUPRAVENT ARRHYTHMIA N/A 07/19/2015    Catheter ablation of cavotricuspid isthmus for atrial flutter [facility])   ???  PR INSER HART PACER XVENOUS ATRIAL N/A 05/30/2015    [employer] dual-chamber pacemaker implant (E.Chung)   ??? PR INSERT/PLACE FLOW DIRECT CATH N/A 03/17/2017    Procedure: Insert Leave In [place];  Surgeon: Marlaine Hind, MD;  Location: [facility] CATH;  Service: Cardiology   ??? PR INSJ/RPLCMT PERM DFB W/TRNSVNS LDS 1/DUAL CHMBR N/A 05/04/2017    Procedure: ICD Implant System (Single/Dual);  Surgeon: Eldred Manges, MD;  Location: [facility] CATH;  Service: Cardiology   ??? PR NEGATIVE PRESSURE WOUND THERAPY DME >50 SQ CM Left 03/25/2017    Procedure: Neg Press Wound Tx (Vac Assist) Incl Topicals, Per Session, Tsa Greater Than/= 50 Cm Squared;  Surgeon: Alonna Buckler Ikonomidis, MD;  Location: MAIN OR [facility];  Service: Cardiac Surgery   ??? PR PRQ TRLUML CORONARY STENT W/ANGIO ONE ART/BRNCH N/A 03/12/2018    Procedure: Percutaneous Coronary Intervention;  Surgeon: Marlaine Hind, MD;  Location: [facility] CATH;  Service: Cardiology   ??? PR REBL VES DIRECT,LOW EXTREM Left 03/19/2017    Procedure: Repr Bld Vessel Direct; Lower Extrem;  Surgeon: Boykin Reaper, MD;  Location: MAIN OR [facility];  Service: Vascular   ??? PR REMV ART CLOT ILIAC-POP,LEG INCIS Left 03/25/2017    Procedure: EMBOLECTOMY OR THROMBECTOMY, WITH OR WITHOUT CATHETER; FEMOROPOPLITEAL, AORTOILIAC ARTERY, BY LEG INCISION;  Surgeon: Alonna Buckler Ikonomidis, MD;  Location: MAIN OR [facility];  Service: Cardiac Surgery   ??? PR UPPER GI ENDOSCOPY,BIOPSY N/A 02/28/2016 Procedure: UGI ENDOSCOPY; WITH BIOPSY, SINGLE OR MULTIPLE;  Surgeon: Liane Comber, MD;  [facility];  Service: Gastroenterology   ??? PR UPPER GI ENDOSCOPY,DIAGNOSIS N/A 05/07/2016    Procedure: UGI ENDO, INCLUDE ESOPHAGUS, STOMACH, & DUODENUM &/OR JEJUNUM; DX W/WO COLLECTION SPECIMN, BY BRUSH OR WASH;  Surgeon: Modena Nunnery, MD;  Location: [facility];  Service: Gastroenterology       Current Outpatient Medications   Medication Sig Dispense Refill   ??? alirocumab (PRALUENT) 150 mg/mL subcutaneous injection Inject 1 mL (150 mg total) under the skin every fourteen (14) days. 4 Syringe 11   ??? allopurinoL (ZYLOPRIM) 100 MG tablet Take 1.5 tablets (150 mg total) by mouth daily. 60 tablet 3   ??? amiodarone (PACERONE) 200 MG tablet TAKE 1 TABLET BY MOUTH ONCE DAILY 90 tablet 3   ??? amLODIPine (NORVASC) 5 MG tablet Take 1 tablet (5 mg total) by mouth daily. 90 tablet 3   ??? atorvastatin (LIPITOR) 80 MG tablet Take 1 tablet (80 mg total) by mouth daily. 90 tablet 3   ??? bumetanide (BUMEX) 2 MG tablet Take 1 tablet (2 mg total) by mouth Two (2) times a day. 180 tablet 3   ??? carvediloL (COREG) 25 MG tablet Take 1.5 tablets (37.5 mg total) by mouth Two (2) times a day. 270 tablet 3   ??? cephalexin (KEFLEX) 500 MG capsule Take 1 capsule (500 mg total) by mouth Three (3) times a day for 14 days. 42 capsule 0   ??? cholecalciferol, vitamin D3, 5,000 unit capsule Take 1 capsule (5,000 Units total) by mouth daily. 100 capsule 0   ??? doxycycline (VIBRA-TABS) 100 MG tablet Take 1 tablet (100 mg total) by mouth Two (2) times a day for 14 days. 28 tablet 0   ??? ezetimibe (ZETIA) 10 mg tablet TAKE 1 TABLET BY MOUTH ONCE DAILY 90 each 3   ??? FLUoxetine (PROZAC) 40 MG capsule Take 2 capsules (80 mg total) by mouth daily. 180 capsule 3   ??? gabapentin (NEURONTIN)  300 MG capsule Take 1 capsule (300 mg total) by mouth two (2) times a day. 180 capsule 3 ??? insulin ASPART (NOVOLOG FLEXPEN U-100 INSULIN) 100 unit/mL (3 mL) injection pen Inject 0.2 mL (20 Units total) under the skin three (3) times a day with a meal. 15 mL 11   ??? insulin detemir U-100 (LEVEMIR) 100 unit/mL (3 mL) injection pen Inject 40 Units total under the skin nightly. 15 mL 3   ??? isosorbide mononitrate (IMDUR) 30 MG 24 hr tablet Take 4 tablets (120 mg total) by mouth daily. 120 tablet 3   ??? omeprazole (PRILOSEC) 40 MG capsule Take 1 capsule (40 mg total) by mouth Two (2) times a day (30 minutes before a meal). 180 capsule 3   ??? rivaroxaban (XARELTO) 15 mg Tab Take 1 tablet (15 mg total) by mouth daily with evening meal. 90 tablet 3   ??? semaglutide 0.25 mg or 0.5 mg(2 mg/1.5 mL) PnIj Inject 0.5 mg under the skin every seven (7) days. 4.5 mL 3   ??? sevelamer (RENVELA) 800 mg tablet TAKE 2 TABLETS (1600MG ) BY MOUTH THREE TIMES DAILY WITH MEALS 540 tablet 3   ??? sodium polystyrene, SPS, with sorbitol (SPS, WITH SORBITOL,) 15-20 gram/60 mL Susp Take 60 mL (15 g) by mouth once a week. 473 mL 3   ??? ticagrelor (BRILINTA) 90 mg Tab TAKE 1 TABLET BY MOUTH TWICE DAILY 180 tablet 3   ??? acetaminophen (TYLENOL) 500 MG tablet TAKE 2 TABLETS (1 000MG ) BY MOUTH EVERY 8 HOURS AS NEEDED FOR PAIN (Patient taking differently: Take 500 mg by mouth every four (4) hours as needed. ) 180 each 3   ??? albuterol HFA 90 mcg/actuation inhaler Inhale 2 puffs into the lungs every 6 (six) hours as needed for wheezing or shortness of breath. 18 g 2   ??? arm brace Misc 1 each by Miscellaneous route daily. 1 each 0   ??? blood sugar diagnostic Strp Disp to match current ins approved meter. Use to test blood sugar four times daily. E11.22, Z79.4 400 each 3   ??? blood-glucose meter kit Disp. blood glucose meter kit preferred by patient's insurance. Dx: Diabetes, E11.9 1 each 11   ??? diclofenac sodium (VOLTAREN) 1 % gel Apply 2 grams topically Three (3) times a day. 100 g 11 ??? empty container Misc Use as directed to dispose of injectable medications 1 each 3   ??? insulin syringe-needle U-100 0.3 mL 31 gauge x 5/16 Syrg Use to inject insulin twice daily with meals 60 each 0   ??? lancets Misc Disp. Ins preferred lancets. Use to test blood sugar four times daily. E11.22, Z79.4 400 each 3   ??? loperamide (IMODIUM) 2 mg capsule Take 1 capsule (2 mg total) by mouth 4 (four) times a day as needed for diarrhea. 30 capsule 0   ??? nitroglycerin (NITROSTAT) 0.4 MG SL tablet Place 1 tablet (0.4 mg total) under the tongue every five (5) minutes as needed for chest pain. 100 each 3   ??? ondansetron (ZOFRAN) 4 MG tablet Take 1 tablet (4 mg total) by mouth every eight (8) hours as needed for nausea for up to 4 days. 10 tablet 0   ??? QUEtiapine (SEROQUEL) 50 MG tablet Take 50 mg by mouth. Takes prn     ??? UNIFINE PENTIPS 31 gauge x 5/16 Ndle USE WITH INSULIN AND VICTOZA 5 TIMES DAILY 400 each 3     No current facility-administered medications for this visit.  Allergies   Allergen Reactions   ??? Losartan      Hyperkalemia (K>6)       Objective:       Physical Exam  BP 160/90 (BP Site: R Arm, BP Position: Supine)  - Pulse 70  - Temp 35.8 ??C (96.5 ??F)  - Ht 188 cm (6' 2)  - Wt (!) 164.2 kg (362 lb 1.6 oz)  - SpO2 96%  - BMI 46.49 kg/m??    Wt Readings from Last 12 Encounters:   01/20/19 (!) 164.2 kg (362 lb 1.6 oz)   01/19/19 (!) 167.5 kg (369 lb 3.2 oz)   01/05/19 (!) 156.1 kg (344 lb 1.6 oz)   12/16/18 (!) 151 kg (333 lb)   12/10/18 (!) 156.8 kg (345 lb 9.6 oz)   12/07/18 (!) 146.9 kg (323 lb 14.4 oz)   12/03/18 (!) 154.3 kg (340 lb 2.7 oz)   11/18/18 (!) 152.4 kg (336 lb)   11/17/18 (!) 152.6 kg (336 lb 6.4 oz)   10/22/18 (!) 153.3 kg (338 lb)   10/15/18 (!) 153.7 kg (338 lb 13.6 oz)   10/07/18 (!) 153.8 kg (339 lb)       General:  Patient is chronically ill-appearing in no acute distress   Eyes:  Intact, sclerae anicteric.   Ears, nose, mouth: Benign Respiratory:   Normal respiratory effort. Clear to auscultation bilaterally.  There are no wheezes.   Cardiovascular:  JVP not seen above the clavicle with HOB at 60 degrees.  Rate and rhythm are regular.  There is no lifts or heaves.  Normal S1, S2. There is no murmur, gallops or rubs.  Radial and pedal pulses are 2+, bilaterally.   There is 2+ pitting pedal/pretibial edema, bilaterally.    Gastrointestinal:   Soft, non-tender, with audible bowel sounds. Abdomen nondistended.  Liver is nonpalpable.   Musculoskeletal: No joint swelling   Skin: Warm, well perfused.   Neurologic: Appropriate mood and affect. Alert and oriented to person, place, and time. No gross motor or sensory deficits evident.     Recent Labs:  Office Visit on 01/20/2019   Component Date Value Ref Range Status   ??? Magnesium 01/20/2019 1.7  1.6 - 2.2 mg/dL Final   ??? Albumin 16/12/9602 3.2* 3.5 - 5.0 g/dL Final   ??? Total Protein 01/20/2019 6.4* 6.5 - 8.3 g/dL Final   ??? Total Bilirubin 01/20/2019 0.5  0.0 - 1.2 mg/dL Final   ??? Bilirubin, Direct 01/20/2019 0.30  0.00 - 0.40 mg/dL Final   ??? AST 54/11/8117 14* 19 - 55 U/L Final   ??? ALT 01/20/2019 9  <50 U/L Final   ??? Alkaline Phosphatase 01/20/2019 93  38 - 126 U/L Final   ??? Sodium 01/20/2019 139  135 - 145 mmol/L Final   ??? Potassium 01/20/2019 5.7* 3.5 - 5.0 mmol/L Final   ??? Chloride 01/20/2019 109* 98 - 107 mmol/L Final   ??? CO2 01/20/2019 27.0  22.0 - 30.0 mmol/L Final   ??? Anion Gap 01/20/2019 3* 7 - 15 mmol/L Final   ??? BUN 01/20/2019 33* 7 - 21 mg/dL Final   ??? Creatinine 01/20/2019 3.73* 0.70 - 1.30 mg/dL Final   ??? BUN/Creatinine Ratio 01/20/2019 9   Final   ??? EGFR CKD-EPI Non-African American,* 01/20/2019 18* >=60 mL/min/1.31m2 Final   ??? EGFR CKD-EPI African American, Male 01/20/2019 21* >=60 mL/min/1.79m2 Final   ??? Glucose 01/20/2019 188* 70 - 179 mg/dL Final   ??? Calcium 14/78/2956 8.5  8.5 -  10.2 mg/dL Final       Lab Results   Component Value Date    PRO-BNP 2,290.0 (H) 01/13/2019 PRO-BNP 3,440.0 (H) 09/21/2018    PRO-BNP 3,080.0 (H) 07/22/2018    PRO-BNP 4,560.0 (H) 07/02/2018    PRO-BNP 1,680.0 (H) 05/31/2018    PRO-BNP 231 (H) 06/08/2014    Creatinine 3.73 (H) 01/20/2019    Creatinine 3.43 (H) 01/13/2019    Creatinine 4.31 (H) 12/23/2018    Creatinine 3.44 (H) 12/04/2018    Creatinine 3.58 (H) 11/24/2018    Creatinine 1.30 06/09/2014    Creatinine 1.56 (H) 06/08/2014    Creatinine 1.32 (H) 10/17/2013    Creatinine 1.41 (H) 10/16/2013    Creatinine 1.36 (H) 07/25/2013    BUN 33 (H) 01/20/2019    BUN 28 (H) 01/13/2019    BUN 44 (H) 12/23/2018    BUN 33 (H) 12/04/2018    BUN 33 (H) 11/24/2018    BUN 24 (H) 06/09/2014    BUN 28 (H) 06/08/2014    BUN 16 10/17/2013    BUN 17 10/16/2013    BUN 16 07/25/2013    Magnesium 1.7 01/20/2019    Magnesium 1.7 01/13/2019    Magnesium 1.8 12/04/2018    Magnesium 2.0 10/23/2018    Magnesium 1.9 10/15/2018    Magnesium 2.0 06/09/2014    Magnesium 1.7 05/12/2011    Magnesium 1.8 01/09/2011       Metric Tracker:  Did today's visit result in ED visit? No  Did today's visit result in hospital admission? No  Did today's visit result in referral to cardiology? n/a  Today's visit was a referral from [facility] Cardiology

## 2019-01-24 ENCOUNTER — Ambulatory Visit: Admit: 2019-01-24 | Discharge: 2019-01-24 | Payer: MEDICARE | Attending: Adult Health | Primary: Adult Health

## 2019-01-24 DIAGNOSIS — I5042 Chronic combined systolic (congestive) and diastolic (congestive) heart failure: Principal | ICD-10-CM

## 2019-01-24 DIAGNOSIS — I480 Paroxysmal atrial fibrillation: Principal | ICD-10-CM

## 2019-01-24 LAB — PRO-BNP: Natriuretic peptide.B prohormone N-Terminal:MCnc:Pt:Ser/Plas:Qn:: 2090 — ABNORMAL HIGH

## 2019-01-24 LAB — MAGNESIUM: Magnesium:MCnc:Pt:Ser/Plas:Qn:: 1.6

## 2019-01-24 MED ORDER — AMLODIPINE 10 MG TABLET
ORAL_TABLET | Freq: Every day | ORAL | 3 refills | 90 days | Status: CP
Start: 2019-01-24 — End: 2020-01-24
  Filled 2019-01-26: qty 90, 90d supply, fill #0

## 2019-01-24 MED ORDER — BUMETANIDE 2 MG TABLET
ORAL_TABLET | ORAL | 3 refills | 90 days | Status: CP
Start: 2019-01-24 — End: 2020-01-24

## 2019-01-24 NOTE — Unmapped (Signed)
[facility] Refill Coordination Note    Specialty Medication(s) to be Shipped:   General Specialty: Praluent    Other medication(s) to be shipped:       Jeremy Oliver, DOB: 1971/03/20  Phone: [phone] (home)       All above HIPAA information was verified with patient.     Completed refill call assessment today to schedule patient's medication shipment from the [facility] [phone]).       Specialty medication(s) and dose(s) confirmed: Regimen is correct and unchanged.   Changes to medications: Jeremy Oliver reports starting the following medications: fluid medications  Changes to insurance: No  Questions for the pharmacist: No    Confirmed patient received Welcome Packet with first shipment. The patient will receive a drug information handout for each medication shipped and additional FDA Medication Guides as required.       DISEASE/MEDICATION-SPECIFIC INFORMATION        N/A    SPECIALTY MEDICATION ADHERENCE     Medication Adherence    Patient reported X missed doses in the last month: 0  Specialty Medication: Praluent 150mg /ml  Patient is on additional specialty medications: No  Informant: patient                Praluent 150 mg/ml: 14 days of medicine on hand         SHIPPING     Shipping address confirmed in Epic.     Delivery Scheduled: Yes, Expected medication delivery date: 11/04.     Medication will be delivered via [facility] clinic to the prescription address in Epic WAM.    Antonietta Barcelona   [facility] Specialty Technician

## 2019-01-24 NOTE — Unmapped (Addendum)
IV Diuresis Clinic Discharge Instructions:    MEDICATIONS:  We are changing your medications today.  - INCREASE Bumex to 4mg  in the morning (2 pills) and 2mg  in the afternoon (1 pill)  - INCREASE amlodipine to 10mg  daily (2 pills) for your high blood pressures   - Try your best to prop your feet up, or wear compression stockings  Call if you have questions about your medications.    IMPORTANT INSTRUCTIONS:  - Weigh yourself daily in the morning; write your weight down and bring it with you next time   - Limit your fluid intake to 2 Liters (half-gallon) per day    - Limit your salt intake to 2-3 grams (2000-3000 mg) per day  - If you have a morning diuresis appointment, don't take your morning diuretic  - If you have an afternoon diuresis appointment, take your morning diuretic    LABS:  We will call you if your labs need attention    NEXT APPOINTMENT:  Return Wednesday at 9am for IV Diuresis Clinic    My office number (c/o De Burrs RN) is [phone] if you need further assistance, or need to schedule with our IV Diuresis clinic in the future.    If you need to reschedule future appointments, please call [phone] or [phone]. After office hours, if you have urgent questions/problems, contact the on-call cardiologist through the hospital operator: [phone].

## 2019-01-24 NOTE — Unmapped (Signed)
[place] [facility]ures[facility] Note    Primary Cardiologist/Cardiology Provider(s):  Dr Andrey Farmer    PCP:  Kurtis Bushman, MD    Last Cardiology Clinic Visit:  11/18/2018  Last IV Diuresis Visit: 01/20/19    Reason for Visit:  Jeremy Oliver is a 48 y.o. male with a history of systolic heart failure, who is being seen today in the [facility] IV Diuresis Clinic for an urgent visit for volume status optimization including IV diuresis.    Assessment/Plan:  1. Acute on Chronic systolic heart failure  - Volume status today is worse. 365.7 --> 366.3  - IV Lasix 80 mg x2 was administered during clinic with good response  - Labs were obtained: Cr 3.6, K 5.1  - Increase Bumex to 4mg  qAM, 2mg  qPM  - Increase amlodipine to 10mg  for better BP control and afterload reduction (appears it was decreased in 10/2018 for orthostasis, but he is now consistently hypertensive)  - Consider hydralazine/isordil given he is not on ACE/ARB due to CKD  - Discussed that he really need to cut down on sodium and try propping his legs as much as he can  - Homeless - currently working towards getting an apartment    Response to IV diuresis:         Medications Heart rate Blood pressure Weight (lbs)   Pre  82 170/88 366.3   Hour 0 IV Lasix 80 mg      Hour 2 IV Lasix 80 mg 75 160/90    Post    364     Output:  I/O       10/24 0701 - 10/25 0700 10/25 0701 - 10/26 0700 10/26 0701 - 10/27 0700    Urine (mL/kg/hr)   1125    Total Output(mL/kg)   1125 (6.8)    Net   -1125                 Return to clinic:    Return in about 2 days (around 01/26/2019) for Diuresis Clinic.    Future Appointments   Date Time Provider Department Center   01/26/2019  9:00 AM DIURESIS HF NP [place] [facility]   01/26/2019  1:45 PM Scot Jun [place], [facility]   01/31/2019  1:00 PM Diane R Dolan-Soto, LCSW [facility]   02/02/2019  8:30 AM Karen Chafe, DPM [facility]   03/03/2019  1:30 PM Marlaine Hind, MD [facility] 03/15/2019  9:10 AM Roma Schanz, MD [facility]   04/07/2019  9:30 AM Mila Merry, MD [facility]   04/26/2019  8:15 AM [facility] EP REMOTE MONITORING [facility]PM[facility]   06/20/2019  8:30 AM Monica Martinez, MD [facility]   06/27/2019  8:30 AM Monica Martinez, MD [facility]   07/26/2019  8:15 AM [place] EP REMOTE MONITORING EPMONITORCH [facility]       History of Present Illness:  Jeremy Oliver is a 48 y.o. male with a history of systolic heart failure who presents today for IV diuresis. Patient last seen in clinic by his PCP 10/21. He was volume up on exam and was referred for IV Diuresis. At his visit 10/22, pt stated that his weight was up to 365lbs from baseline of about 330lbs. This was likely in context of being homeless and living out of his car; he was eating mostly canned foods, whatever he could get. He had  LE edema and bloating. He had a little more DOE than usual. He denied orthopnea, PND. He responded well to IV Lasix 80mg  x2 and was continued on Bumex 2mg  BID.    Today he presents for follow-up. His weight is not decreased from prior and LE edema is not improved. He thinks this is due to not being able to prop his legs up. He and his wife have looked at an apartment but it will still be at least a few weeks before they can move in. He is still eating canned food. Admits to having a pork chop biscuit this morning.      Past Medical History:   Diagnosis Date   ??? Angina at rest (CMS-[name])    ??? Arthritis    ??? Asthma    ??? Atrial fibrillation and flutter (CMS-[name]) 06/08/14   ??? BMI 40.0-44.9, adult (CMS-[name]) 10/29/2015   ??? Chronic anticoagulation 02/17/2018   ??? Chronic kidney disease, stage 3    ??? Chronic pain syndrome 04/11/2014    ~Use daily lyrica as recommended ~Tramadol as needed for pain ~Fluoxetine daily as recommended ~Daily exercise ~Eat a healthy diet ~Good control of your blood sugars can help    ??? Coronary artery disease    ??? Depression ??? Diabetes mellitus (CMS-[name])    ??? Eye trauma 1998    glass in both eyes and removed   ??? GERD (gastroesophageal reflux disease)    ??? Gout    ??? Homeless    ??? Hyperlipidemia    ??? Hypertension    ??? Illiteracy and low-level literacy    ??? Microscopic hematuria    ??? Migraine    ??? Nephrolithiasis    ??? 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 Nonalcoholic fatty liver disease    ??? NSTEMI (non-ST elevated myocardial infarction) (CMS-[name]) 01/13/2017   ??? Obesity    ??? Obstructive sleep apnea 2009   ??? Panic attacks    ??? 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 Seizure-like activity (CMS-[name])     ~Monitor for symptoms and report them to your primary care provider if they occur    ??? Systolic CHF, chronic (CMS-[name])     EF 40-45% 2017   ??? TIA (transient ischemic attack)      Past Surgical History:   Procedure Laterality Date   ??? CARDIAC CATHETERIZATION  03/08/2007   ??? CORONARY STENT PLACEMENT     ??? CYSTOURETHROSCOPY  12/31/2009     Microscopic hematuria   ??? KNEE SURGERY Right 09/23/2006    arthroscopic knee surgery medial and lateral meniscus tears as well as crystal disease   ??? PR CATH PLACE/CORON ANGIO, IMG SUPER/INTERP,R&L HRT CATH, L HRT VENTRIC N/A 02/13/2017    Procedure: Left/Right Heart Catheterization;  Surgeon: Marlaine Hind, MD;  Location: [facility] CATH;  Service: Cardiology   ??? PR CATH PLACE/CORON ANGIO, IMG SUPER/INTERP,W LEFT HEART VENTRICULOGRAPHY N/A 03/17/2017 Procedure: Left Heart Catheterization W Intervention;  Surgeon: Marlaine Hind, MD;  Location: [facility] CATH;  Service: Cardiology   ??? PR CATH PLACE/CORON ANGIO, IMG SUPER/INTERP,W LEFT HEART VENTRICULOGRAPHY  N/A 05/12/2018    Procedure: CATH LEFT HEART CATHETERIZATION W INTERVENTION;  Surgeon: Dorathy Kinsman, MD;  Location: [facility] CATH;  Service: Cardiology   ??? PR CATH PLACE/CORON ANGIO, IMG SUPER/INTERP,W LEFT HEART VENTRICULOGRAPHY N/A 10/22/2018    Procedure: CATH LEFT HEART CATHETERIZATION W INTERVENTION;  Surgeon: Marlaine Hind, MD;  Location: [facility] CATH;  Service: Cardiology   ??? PR ECMO/ECLS INITIATION VENO-ARTERIAL N/A 03/19/2017    Procedure: ECMO/ECLS; INITIATION, VENO-ARTERIAL;  Surgeon: Lennie Odor, MD;  Location: MAIN OR [facility];  Service: Cardiac Surgery   ??? PR ECMO/ECLS RMVL PRPH CANNULA OPEN 6 YRS & OLDER Left 03/25/2017    Procedure: ECMO / ECLS PROVIDED BY PHYSICIAN; REMOVAL OF PERIPHERAL (ARTERIAL AND/OR VENOUS) CANNULA(E), OPEN, 6 YEARS AND OLDER;  Surgeon: Alonna Buckler Ikonomidis, MD;  Location: MAIN OR [facility];  Service: Cardiac Surgery   ??? PR EPHYS EVAL W/ ABLATION SUPRAVENT ARRHYTHMIA N/A 07/19/2015    Catheter ablation of cavotricuspid isthmus for atrial flutter [facility])   ??? PR INSER HART PACER XVENOUS ATRIAL N/A 05/30/2015    [employer] dual-chamber pacemaker implant (E.Chung)   ??? PR INSERT/PLACE FLOW DIRECT CATH N/A 03/17/2017    Procedure: Insert Leave In [place];  Surgeon: Marlaine Hind, MD;  Location: [facility] CATH;  Service: Cardiology   ??? PR INSJ/RPLCMT PERM DFB W/TRNSVNS LDS 1/DUAL CHMBR N/A 05/04/2017    Procedure: ICD Implant System (Single/Dual);  Surgeon: Eldred Manges, MD;  Location: [facility] CATH;  Service: Cardiology   ??? PR NEGATIVE PRESSURE WOUND THERAPY DME >50 SQ CM Left 03/25/2017 Procedure: Neg Press Wound Tx (Vac Assist) Incl Topicals, Per Session, Tsa Greater Than/= 50 Cm Squared;  Surgeon: Alonna Buckler Ikonomidis, MD;  Location: MAIN OR [facility];  Service: Cardiac Surgery   ??? PR PRQ TRLUML CORONARY STENT W/ANGIO ONE ART/BRNCH N/A 03/12/2018    Procedure: Percutaneous Coronary Intervention;  Surgeon: Marlaine Hind, MD;  Location: [facility] CATH;  Service: Cardiology   ??? PR REBL VES DIRECT,LOW EXTREM Left 03/19/2017    Procedure: Repr Bld Vessel Direct; Lower Extrem;  Surgeon: Boykin Reaper, MD;  Location: MAIN OR [facility];  Service: Vascular   ??? PR REMV ART CLOT ILIAC-POP,LEG INCIS Left 03/25/2017    Procedure: EMBOLECTOMY OR THROMBECTOMY, WITH OR WITHOUT CATHETER; FEMOROPOPLITEAL, AORTOILIAC ARTERY, BY LEG INCISION;  Surgeon: Alonna Buckler Ikonomidis, MD;  Location: MAIN OR [facility];  Service: Cardiac Surgery   ??? PR UPPER GI ENDOSCOPY,BIOPSY N/A 02/28/2016    Procedure: UGI ENDOSCOPY; WITH BIOPSY, SINGLE OR MULTIPLE;  Surgeon: Liane Comber, MD;  [facility]: [facility];  Service: Gastroenterology   ??? PR UPPER GI ENDOSCOPY,DIAGNOSIS N/A 05/07/2016    Procedure: UGI ENDO, INCLUDE ESOPHAGUS, STOMACH, & DUODENUM &/OR JEJUNUM; DX W/WO COLLECTION SPECIMN, BY BRUSH OR WASH;  Surgeon: Modena Nunnery, MD;  Location: [facility];  Service: Gastroenterology       Current Outpatient Medications   Medication Sig Dispense Refill   ??? alirocumab (PRALUENT) 150 mg/mL subcutaneous injection Inject 1 mL (150 mg total) under the skin every fourteen (14) days. 4 Syringe 11   ??? allopurinoL (ZYLOPRIM) 100 MG tablet Take 1.5 tablets (150 mg total) by mouth daily. 60 tablet 3   ??? amiodarone (PACERONE) 200 MG tablet TAKE 1 TABLET BY MOUTH ONCE DAILY 90 tablet 3   ??? amLODIPine (NORVASC) 10 MG tablet Take 1 tablet (10 mg total) by mouth daily. 90 tablet 3   ??? atorvastatin (LIPITOR) 80 MG tablet Take 1 tablet (80 mg total)  by mouth daily. 90 tablet 3 ??? bumetanide (BUMEX) 2 MG tablet Take 2 tablets (4 mg total) by mouth daily AND 1 tablet (2 mg total) daily with lunch. 270 tablet 3   ??? carvediloL (COREG) 25 MG tablet Take 1.5 tablets (37.5 mg total) by mouth Two (2) times a day. 270 tablet 3   ??? cephalexin (KEFLEX) 500 MG capsule Take 1 capsule (500 mg total) by mouth Three (3) times a day for 14 days. 42 capsule 0   ??? cholecalciferol, vitamin D3, 5,000 unit capsule Take 1 capsule (5,000 Units total) by mouth daily. 100 capsule 0   ??? doxycycline (VIBRA-TABS) 100 MG tablet Take 1 tablet (100 mg total) by mouth Two (2) times a day for 14 days. 28 tablet 0   ??? ezetimibe (ZETIA) 10 mg tablet TAKE 1 TABLET BY MOUTH ONCE DAILY 90 each 3   ??? rivaroxaban (XARELTO) 15 mg Tab Take 1 tablet (15 mg total) by mouth daily with evening meal. 90 tablet 3   ??? semaglutide 0.25 mg or 0.5 mg(2 mg/1.5 mL) PnIj Inject 0.5 mg under the skin every seven (7) days. 4.5 mL 3   ??? sevelamer (RENVELA) 800 mg tablet TAKE 2 TABLETS (1600MG ) BY MOUTH THREE TIMES DAILY WITH MEALS 540 tablet 3   ??? ticagrelor (BRILINTA) 90 mg Tab TAKE 1 TABLET BY MOUTH TWICE DAILY 180 tablet 3   ??? acetaminophen (TYLENOL) 500 MG tablet TAKE 2 TABLETS (1 000MG ) BY MOUTH EVERY 8 HOURS AS NEEDED FOR PAIN (Patient taking differently: Take 500 mg by mouth every four (4) hours as needed. ) 180 each 3   ??? albuterol HFA 90 mcg/actuation inhaler Inhale 2 puffs into the lungs every 6 (six) hours as needed for wheezing or shortness of breath. 18 g 2   ??? arm brace Misc 1 each by Miscellaneous route daily. 1 each 0   ??? blood sugar diagnostic Strp Disp to match current ins approved meter. Use to test blood sugar four times daily. E11.22, Z79.4 400 each 3   ??? blood-glucose meter kit Disp. blood glucose meter kit preferred by patient's insurance. Dx: Diabetes, E11.9 1 each 11   ??? diclofenac sodium (VOLTAREN) 1 % gel Apply 2 grams topically Three (3) times a day. 100 g 11 ??? empty container Misc Use as directed to dispose of injectable medications 1 each 3   ??? FLUoxetine (PROZAC) 40 MG capsule Take 2 capsules (80 mg total) by mouth daily. 180 capsule 3   ??? gabapentin (NEURONTIN) 300 MG capsule Take 1 capsule (300 mg total) by mouth two (2) times a day. 180 capsule 3   ??? insulin ASPART (NOVOLOG FLEXPEN U-100 INSULIN) 100 unit/mL (3 mL) injection pen Inject 0.2 mL (20 Units total) under the skin three (3) times a day with a meal. 15 mL 11   ??? insulin detemir U-100 (LEVEMIR) 100 unit/mL (3 mL) injection pen Inject 40 Units total under the skin nightly. 15 mL 3   ??? insulin syringe-needle U-100 0.3 mL 31 gauge x 5/16 Syrg Use to inject insulin twice daily with meals 60 each 0   ??? isosorbide mononitrate (IMDUR) 30 MG 24 hr tablet Take 4 tablets (120 mg total) by mouth daily. 120 tablet 3   ??? lancets Misc Disp. Ins preferred lancets. Use to test blood sugar four times daily. E11.22, Z79.4 400 each 3   ??? loperamide (IMODIUM) 2 mg capsule Take 1 capsule (2 mg total) by mouth 4 (four) times a day as  needed for diarrhea. 30 capsule 0   ??? nitroglycerin (NITROSTAT) 0.4 MG SL tablet Place 1 tablet (0.4 mg total) under the tongue every five (5) minutes as needed for chest pain. 100 each 3   ??? omeprazole (PRILOSEC) 40 MG capsule Take 1 capsule (40 mg total) by mouth Two (2) times a day (30 minutes before a meal). 180 capsule 3   ??? ondansetron (ZOFRAN) 4 MG tablet Take 1 tablet (4 mg total) by mouth every eight (8) hours as needed for nausea for up to 4 days. 10 tablet 0   ??? QUEtiapine (SEROQUEL) 50 MG tablet Take 50 mg by mouth. Takes prn     ??? sodium polystyrene, SPS, with sorbitol (SPS, WITH SORBITOL,) 15-20 gram/60 mL Susp Take 60 mL (15 g) by mouth once a week. 473 mL 3   ??? UNIFINE PENTIPS 31 gauge x 5/16 Ndle USE WITH INSULIN AND VICTOZA 5 TIMES DAILY 400 each 3     No current facility-administered medications for this visit.      Allergies   Allergen Reactions   ??? Losartan Hyperkalemia (K>6)       Objective:       Physical Exam  BP 160/90  - Pulse 75  - Temp 36.2 ??C (97.1 ??F)  - Ht 188 cm (6' 2)  - Wt (!) 165.1 kg (364 lb)  - SpO2 98%  - BMI 46.73 kg/m??             01/24/19 (!) 165.1 kg (364 lb)   01/20/19 (!) 164.2 kg (362 lb 1.6 oz)   01/19/19 (!) 167.5 kg (369 lb 3.2 oz)   01/05/19 (!) 156.1 kg (344 lb 1.6 oz)   12/16/18 (!) 151 kg (333 lb)   12/10/18 (!) 156.8 kg (345 lb 9.6 oz)   12/07/18 (!) 146.9 kg (323 lb 14.4 oz)   12/03/18 (!) 154.3 kg (340 lb 2.7 oz)   11/18/18 (!) 152.4 kg (336 lb)   11/17/18 (!) 152.6 kg (336 lb 6.4 oz)   10/22/18 (!) 153.3 kg (338 lb)   10/15/18 (!) 153.7 kg (338 lb 13.6 oz)   10/07/18 (!) 153.8 kg (339 lb)   09/29/18 (!) 155.7 kg (343 lb 3.2 oz)   09/21/18 (!) 154.4 kg (340 lb 6.2 oz)   08/12/18 (!) 154.4 kg (340 lb 4.8 oz)   07/22/18 (!) 154.5 kg (340 lb 11.2 oz)   07/02/18 (!) 151.2 kg (333 lb 4.8 oz)   06/22/18 (!) 154.7 kg (341 lb 0.8 oz)   05/31/18 (!) 153.2 kg (337 lb 11.9 oz)   05/31/18 (!) 152.2 kg (335 lb 8.6 oz)   05/28/18 (!) 151 kg (333 lb)   05/22/18 (!) 133.5 kg (294 lb 5 oz)   05/20/18 (!) 153 kg (337 lb 6.4 oz)   05/17/18 (!) 160.3 kg (353 lb 6.4 oz)   05/11/18 (!) 158.9 kg (350 lb 6.4 oz)   04/23/18 (!) 161.7 kg (356 lb 6.4 oz)   04/02/18 (!) 153.9 kg (339 lb 4.8 oz)   03/18/18 (!) 151.5 kg (334 lb)   03/18/18 (!) 152.4 kg (336 lb)     General:  Patient is chronically ill-appearing in no acute distress   Eyes:  Intact, sclerae anicteric.   Ears, nose, mouth: Benign   Respiratory:   Normal respiratory effort. Clear to auscultation bilaterally.  There are no wheezes.   Cardiovascular:  Exam limited by thick next. JVP not seen above the clavicle with HOB at 60  degrees.  Rate and rhythm are regular.  There is no lifts or heaves.  Normal S1, S2. There is no murmur, gallops or rubs.  Radial and pedal pulses are 2+, bilaterally.   There is 2+ pitting pedal/pretibial edema, bilaterally. Gastrointestinal:   Obese, Soft, non-tender, with audible bowel sounds. Abdomen nondistended.  Liver is nonpalpable.   Musculoskeletal: No joint swelling   Skin: Warm, well perfused.   Neurologic: Appropriate mood and affect. Alert and oriented to person, place, and time. No gross motor or sensory deficits evident.     Recent Labs:  Office Visit on 01/24/2019   Component Date Value Ref Range Status   ??? Magnesium 01/24/2019 1.6  1.6 - 2.2 mg/dL Final   ??? PRO-BNP 16/12/9602 2,090.0* 0.0 - 138.0 pg/mL Final   ??? Sodium, POCT 01/24/2019 139  135 - 145 mmol/L Final   ??? Potassium, POCT 01/24/2019 5.1* 3.5 - 5.0 mmol/L Final   ??? Chloride, POCT 01/24/2019 106  98 - 107 mmol/L Final   ??? CO2, POCT 01/24/2019 24  22 - 30 mmol/L Final   ??? BUN, POCT 01/24/2019 28* 7 - 21 mg/dL Final   ??? Creatinine, POCT 01/24/2019 3.6* 0.7 - 1.3 mg/dL Final   ??? Glucose, POCT 01/24/2019 187* 70 - 179 mg/dL Final   ??? Calcium, POCT 01/24/2019 9.1  8.5 - 10.2 mg/dL Final   Office Visit on 01/20/2019   Component Date Value Ref Range Status   ??? Magnesium 01/20/2019 1.7  1.6 - 2.2 mg/dL Final   ??? PRO-BNP 54/11/8117 2,550.0* 0.0 - 138.0 pg/mL Final   ??? Albumin 01/20/2019 3.2* 3.5 - 5.0 g/dL Final   ??? Total Protein 01/20/2019 6.4* 6.5 - 8.3 g/dL Final   ??? Total Bilirubin 01/20/2019 0.5  0.0 - 1.2 mg/dL Final   ??? Bilirubin, Direct 01/20/2019 0.30  0.00 - 0.40 mg/dL Final   ??? AST 14/78/2956 14* 19 - 55 U/L Final   ??? ALT 01/20/2019 9  <50 U/L Final   ??? Alkaline Phosphatase 01/20/2019 93  38 - 126 U/L Final   ??? Sodium 01/20/2019 139  135 - 145 mmol/L Final   ??? Potassium 01/20/2019 5.7* 3.5 - 5.0 mmol/L Final   ??? Chloride 01/20/2019 109* 98 - 107 mmol/L Final   ??? CO2 01/20/2019 27.0  22.0 - 30.0 mmol/L Final   ??? Anion Gap 01/20/2019 3* 7 - 15 mmol/L Final   ??? BUN 01/20/2019 33* 7 - 21 mg/dL Final   ??? Creatinine 01/20/2019 3.73* 0.70 - 1.30 mg/dL Final   ??? BUN/Creatinine Ratio 01/20/2019 9   Final ??? EGFR CKD-EPI Non-African American,* 01/20/2019 18* >=60 mL/min/1.35m2 Final   ??? EGFR CKD-EPI African American, Male 01/20/2019 21* >=60 mL/min/1.50m2 Final   ??? Glucose 01/20/2019 188* 70 - 179 mg/dL Final   ??? Calcium 21/30/8657 8.5  8.5 - 10.2 mg/dL Final       Lab Results   Component Value Date    PRO-BNP 2,090.0 (H) 01/24/2019    PRO-BNP 2,550.0 (H) 01/20/2019    PRO-BNP 2,290.0 (H) 01/13/2019    PRO-BNP 3,440.0 (H) 09/21/2018    PRO-BNP 3,080.0 (H) 07/22/2018    PRO-BNP 231 (H) 06/08/2014    Creatinine, POCT 3.6 (H) 01/24/2019    Creatinine 3.73 (H) 01/20/2019    Creatinine 3.43 (H) 01/13/2019    Creatinine 4.31 (H) 12/23/2018    Creatinine 3.44 (H) 12/04/2018    Creatinine 3.58 (H) 11/24/2018    Creatinine 1.30 06/09/2014    Creatinine 1.56 (H)  06/08/2014    Creatinine 1.32 (H) 10/17/2013    Creatinine 1.41 (H) 10/16/2013    Creatinine 1.36 (H) 07/25/2013    BUN 33 (H) 01/20/2019    BUN 28 (H) 01/13/2019    BUN 44 (H) 12/23/2018    BUN 33 (H) 12/04/2018    BUN 33 (H) 11/24/2018    BUN 24 (H) 06/09/2014    BUN 28 (H) 06/08/2014    BUN 16 10/17/2013    BUN 17 10/16/2013    BUN 16 07/25/2013    BUN, POCT 28 (H) 01/24/2019    Magnesium 1.6 01/24/2019    Magnesium 1.7 01/20/2019    Magnesium 1.7 01/13/2019    Magnesium 1.8 12/04/2018    Magnesium 2.0 10/23/2018    Magnesium 2.0 06/09/2014    Magnesium 1.7 05/12/2011    Magnesium 1.8 01/09/2011       Metric Tracker:  Did today's visit result in ED visit? No  Did today's visit result in hospital admission? No  Did today's visit result in referral to cardiology? n/a  Today's visit was a referral from [facility] Cardiology

## 2019-01-26 ENCOUNTER — Ambulatory Visit: Admit: 2019-01-26 | Discharge: 2019-01-27 | Payer: MEDICARE

## 2019-01-26 ENCOUNTER — Ambulatory Visit: Admit: 2019-01-26 | Discharge: 2019-01-27 | Payer: MEDICARE | Attending: Adult Health | Primary: Adult Health

## 2019-01-26 DIAGNOSIS — I10 Essential (primary) hypertension: Principal | ICD-10-CM

## 2019-01-26 DIAGNOSIS — I5042 Chronic combined systolic (congestive) and diastolic (congestive) heart failure: Principal | ICD-10-CM

## 2019-01-26 LAB — PRO-BNP: Natriuretic peptide.B prohormone N-Terminal:MCnc:Pt:Ser/Plas:Qn:: 3670 — ABNORMAL HIGH

## 2019-01-26 LAB — MAGNESIUM: Magnesium:MCnc:Pt:Ser/Plas:Qn:: 1.6

## 2019-01-26 MED FILL — DICLOFENAC 1 % TOPICAL GEL: 17 days supply | Qty: 100 | Fill #9 | Status: AC

## 2019-01-26 MED FILL — VENTOLIN HFA 90 MCG/ACTUATION AEROSOL INHALER: 25 days supply | Qty: 18 | Fill #1 | Status: AC

## 2019-01-26 MED FILL — RENVELA 800 MG TABLET: 90 days supply | Qty: 540 | Fill #0 | Status: AC

## 2019-01-26 MED FILL — ACETAMINOPHEN 500 MG TABLET: 30 days supply | Qty: 180 | Fill #2 | Status: AC

## 2019-01-26 MED FILL — DICLOFENAC 1 % TOPICAL GEL: TOPICAL | 17 days supply | Qty: 100 | Fill #9

## 2019-01-26 MED FILL — ISOSORBIDE MONONITRATE ER 30 MG TABLET,EXTENDED RELEASE 24 HR: 30 days supply | Qty: 120 | Fill #0 | Status: AC

## 2019-01-26 MED FILL — ACETAMINOPHEN 500 MG TABLET: 30 days supply | Qty: 180 | Fill #2

## 2019-01-26 MED FILL — AMLODIPINE 10 MG TABLET: 90 days supply | Qty: 90 | Fill #0 | Status: AC

## 2019-01-26 MED FILL — NITROGLYCERIN 0.4 MG SUBLINGUAL TABLET: SUBLINGUAL | 20 days supply | Qty: 100 | Fill #1

## 2019-01-26 MED FILL — VENTOLIN HFA 90 MCG/ACTUATION AEROSOL INHALER: RESPIRATORY_TRACT | 25 days supply | Qty: 18 | Fill #1

## 2019-01-26 MED FILL — NITROGLYCERIN 0.4 MG SUBLINGUAL TABLET: 20 days supply | Qty: 100 | Fill #1 | Status: AC

## 2019-01-26 NOTE — Unmapped (Signed)
.  [facility] BPs  BP#1   172/101   BP#2   161/92  BP#3   154/82    Average BP   162/92

## 2019-01-26 NOTE — Unmapped (Signed)
IV Diuresis Clinic Discharge Instructions:    MEDICATIONS:  NO medication changes today.  - Continue Bumex 2mg  twice daily as you were doing  Call if you have questions about your medications.    IMPORTANT INSTRUCTIONS:  - Weigh yourself daily in the morning; write your weight down and bring it with you next time   - Limit your fluid intake to 2 Liters (half-gallon) per day    - Limit your salt intake to 2-3 grams (2000-3000 mg) per day  - If you have a morning diuresis appointment, don't take your morning diuretic  - If you have an afternoon diuresis appointment, take your morning diuretic    LABS:  We will call you if your labs need attention    NEXT APPOINTMENT:  Return to clinic in 1 weeks with IV Diuresis - Wednesday 11/4 at 9am    My office number (c/o De Burrs RN) is [phone] if you need further assistance, or need to schedule with our IV Diuresis clinic in the future.    If you need to reschedule future appointments, please call [phone] or [phone]. After office hours, if you have urgent questions/problems, contact the on-call cardiologist through the hospital operator: [phone].

## 2019-01-26 NOTE — Unmapped (Signed)
ASSESSMENT AND PLAN:     1. htn - uncontrolled though improving.  Continue amlodipine, carvedilol, Bumex, isosorbide.  Has ARB allergy.  Reassess 2 wks  2. afib - tolerating Xarelto 15mg  daily (dose reduced 2/2 CKD).  CBC, SCr, LFTs stable in 10/20, continue regimen w/ plan to monitor labs q63mo    Follow up: 2 wks    CHIEF COMPLAINT: Followup hypertension.     HISTORY OF PRESENT ILLNESS: 48 y.o.   Patient Active Problem List   Diagnosis   ??? Idiopathic chronic gout of left knee   ??? Hypercholesteremia   ??? Essential hypertension (RAF-[name])   ??? 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 Osteoarthritis   ??? Chronic nonalcoholic liver disease   ??? GERD (gastroesophageal reflux disease)   ??? Mild intermittent asthma without complication   ??? Chronic pain syndrome   ??? Paroxysmal atrial fibrillation (CMS-[name])   ??? OSA (obstructive sleep apnea)   ??? Tobacco use disorder   ??? Status post cardiac pacemaker procedure   ??? Diabetes mellitus with gastroparesis (CMS-[name])   ??? Cardiac pacemaker in situ   ??? Recurrent major depressive disorder, in full remission (CMS-[name])   ??? NSTEMI (non-ST elevated myocardial infarction) (CMS-[name])   ??? Adjustment disorder, unspecified   ??? Hyperkalemia   ??? CKD (chronic kidney disease) stage 4, GFR 15-29 ml/min (CMS-[name])   ??? Acquired left foot drop   ??? Decreased sensation of lower extremity, left   ??? Chronic anticoagulation   ??? Type 2 diabetes mellitus with stage 4 chronic kidney disease, with long-term current use of insulin (CMS-[name])   ??? Situational stress   ??? Ulcer of left foot with fat layer exposed (CMS-[name])       Presents for htn f/u in setting of recently Bumex increase to 4mg  qam, 2mg  qpm, tolerating.  Also continues regular diuresis clinic interventions w/ IV furosemide.  Had diuresis today and will have another next week including BP check at diuresis clinic    ROS: negative for CP, HA, dizziness, vision changes Your Medication List          Accurate as of January 26, 2019  2:47 PM. If you have any questions, ask your nurse or doctor.            CHANGE how you take these medications    acetaminophen 500 MG tablet  Commonly known as: TYLENOL  TAKE 2 TABLETS (1 000MG ) BY MOUTH EVERY 8 HOURS AS NEEDED FOR PAIN  What changed:   ?? how much to take  ?? how to take this  ?? when to take this  ?? reasons to take this        CONTINUE taking these medications    allopurinoL 100 MG tablet  Commonly known as: ZYLOPRIM  Take 1.5 tablets (150 mg total) by mouth daily.     amiodarone 200 MG tablet  Commonly known as: PACERONE  TAKE 1 TABLET BY MOUTH ONCE DAILY     amLODIPine 10 MG tablet  Commonly known as: NORVASC  Take 1 tablet (10 mg total) by mouth daily.     arm brace Misc  1 each by Miscellaneous route daily.     atorvastatin 80 MG tablet  Commonly known as: LIPITOR  Take 1 tablet (80 mg total) by mouth daily.     blood sugar diagnostic Strp  Disp to match current ins approved meter. Use to test blood sugar  four times daily. E11.22, Z79.4     blood-glucose meter kit  Disp. blood glucose meter kit preferred by patient's insurance. Dx: Diabetes, E11.9     bumetanide 2 MG tablet  Commonly known as: BUMEX  Take 2 tablets (4 mg total) by mouth daily AND 1 tablet (2 mg total) daily with lunch.     carvediloL 25 MG tablet  Commonly known as: COREG  Take 1.5 tablets (37.5 mg total) by mouth Two (2) times a day.     cholecalciferol (vitamin D3) 5,000 unit capsule  Take 1 capsule (5,000 Units total) by mouth daily.     diclofenac sodium 1 % gel  Commonly known as: VOLTAREN  Apply 2 grams topically Three (3) times a day.     empty container Misc  Use as directed to dispose of injectable medications     ezetimibe 10 mg tablet  Commonly known as: ZETIA  TAKE 1 TABLET BY MOUTH ONCE DAILY     FLUoxetine 40 MG capsule  Commonly known as: PROzac  Take 2 capsules (80 mg total) by mouth daily.     gabapentin 300 MG capsule  Commonly known as: NEURONTIN Take 1 capsule (300 mg total) by mouth two (2) times a day.     insulin syringe-needle U-100 0.3 mL 31 gauge x 5/16 (8 mm) Syrg  Use to inject insulin twice daily with meals     isosorbide mononitrate 30 MG 24 hr tablet  Commonly known as: IMDUR  Take 4 tablets (120 mg total) by mouth daily.     lancets Misc  Disp. Ins preferred lancets. Use to test blood sugar four times daily. E11.22, Z79.4     LEVEMIR FLEXTOUCH U-100 INSULN 100 unit/mL (3 mL) injection pen  Generic drug: insulin detemir U-100  Inject 40 Units total under the skin nightly.     loperamide 2 mg capsule  Commonly known as: IMODIUM  Take 1 capsule (2 mg total) by mouth 4 (four) times a day as needed for diarrhea.     nitroglycerin 0.4 MG SL tablet  Commonly known as: NITROSTAT  Place 1 tablet (0.4 mg total) under the tongue every five (5) minutes as needed for chest pain.     NovoLOG Flexpen U-100 Insulin 100 unit/mL (3 mL) injection pen  Generic drug: insulin ASPART  Inject 0.2 mL (20 Units total) under the skin three (3) times a day with a meal.     omeprazole 40 MG capsule  Commonly known as: PriLOSEC  Take 1 capsule (40 mg total) by mouth Two (2) times a day (30 minutes before a meal).     ondansetron 4 MG tablet  Commonly known as: ZOFRAN  Take 1 tablet (4 mg total) by mouth every eight (8) hours as needed for nausea for up to 4 days.     PRALUENT PEN 150 mg/mL subcutaneous injection  Generic drug: alirocumab  Inject 1 mL (150 mg total) under the skin every fourteen (14) days.     QUEtiapine 50 MG tablet  Commonly known as: SEROquel  Take 50 mg by mouth. Takes prn     rivaroxaban 15 mg Tab  Commonly known as: XARELTO  Take 1 tablet (15 mg total) by mouth daily with evening meal.     semaglutide 0.25 mg or 0.5 mg(2 mg/1.5 mL) Pnij  Inject 0.5 mg under the skin every seven (7) days.     sevelamer 800 mg tablet  Commonly known as: RENVELA  TAKE 2 TABLETS (1600MG )  BY MOUTH THREE TIMES DAILY WITH MEALS     SPS (WITH SORBITOL) 15-20 gram/60 mL Susp Generic drug: sodium polystyrene (SPS) with sorbitol  Take 60 mL (15 g) by mouth once a week.     ticagrelor 90 mg Tab  Commonly known as: BRILINTA  TAKE 1 TABLET BY MOUTH TWICE DAILY     UNIFINE PENTIPS 31 gauge x 5/16 (8 mm) Ndle  Generic drug: pen needle, diabetic  USE WITH INSULIN AND VICTOZA 5 TIMES DAILY     VENTOLIN HFA 90 mcg/actuation inhaler  Generic drug: albuterol  Inhale 2 puffs into the lungs every 6 (six) hours as needed for wheezing or shortness of breath.            Vitals:    01/26/19 1353   BP: 162/92   Pulse: 88   Temp: 36.3 ??C (97.4 ??F)   TempSrc: Temporal   SpO2: 96%   Weight: (!) 160.6 kg (354 lb)   Height: 190.5 cm (6' 3)       Medication adherence and barriers to the treatment plan have been addressed. Opportunities to optimize healthy behaviors have been discussed. Patient / caregiver voiced understanding.

## 2019-01-26 NOTE — Unmapped (Signed)
[place] IV Diuresis Clinic Note    Primary Cardiologist/Cardiology Provider(s):  Dr Andrey Farmer    PCP:  Kurtis Bushman, MD    Last Cardiology Clinic Visit:  11/18/2018  Last IV Diuresis Visit: 01/24/19    Reason for Visit:  Jeremy Oliver is a 48 y.o. male with a history of systolic heart failure, who is being seen today in the [facility] IV Diuresis Clinic for an urgent visit for volume status optimization including IV diuresis.    Assessment/Plan:  1. Acute on Chronic systolic heart failure  - Volume status today is worse. 365.7 --> 366.3 --> 357.3  - IV Lasix 80 mg x2 was administered during clinic with good response  - Labs were obtained: Cr 3.9, K 5.3  - Continue Bumex 2mg  BID  - Increased amlodipine to 10mg  for better BP control and afterload reduction at last visit (appears it was decreased in 10/2018 for orthostasis, but he is now consistently hypertensive)  - Consider hydralazine/isordil given he is not on ACE/ARB due to CKD  - Discussed that he really need to cut down on sodium and try propping his legs as much as he can  - Homeless - currently working towards getting an apartment, says he has found one.     Response to IV diuresis:         Medications Heart rate Blood pressure Weight (lbs)   Pre  83 172/80 357.3   Hour 0 IV Lasix 80 mg      Hour 2 IV Lasix 80 mg      Post    354.4     Output:  I/O       10/26 0701 - 10/27 0700 10/27 0701 - 10/28 0700 10/28 0701 - 10/29 0700    Urine (mL/kg/hr)   1100    Total Output(mL/kg)   1100 (6.8)    Net   -1100                 Return to clinic:    Return in about 1 week (around 02/02/2019) for Diuresis Clinic.    Future Appointments   Date Time Provider Department Center   01/26/2019  1:45 PM Scot Jun [place], [place] [facility]   01/31/2019  1:00 PM Ebbie Latus, LCSW [place] [facility]   02/02/2019  8:30 AM Margarito Liner Heisler, DPM [facility]   02/02/2019  9:00 AM DIURESIS HF NP [place] [facility] 03/03/2019  1:30 PM Marlaine Hind, MD [facility]   03/15/2019  9:10 AM Roma Schanz, MD [facility]   04/07/2019  9:30 AM Mila Merry, MD [facility]   04/26/2019  8:15 AM [facility] EP REMOTE MONITORING [facility]   06/20/2019  8:30 AM Monica Martinez, MD [facility]   06/27/2019  8:30 AM Monica Martinez, MD [facility]   07/26/2019  8:15 AM [place] EP REMOTE MONITORING EPMONITORCH [facility]       History of Present Illness:  Jeremy Oliver is a 48 y.o. male with a history of systolic heart failure who presents today for IV diuresis. Patient last seen in clinic by his PCP 10/21. He was volume up on exam and was referred for IV Diuresis. At his visit 10/22, pt stated that his weight was up to 365lbs from baseline of about 330lbs. This was likely in context of being homeless and living out of his car; he was eating mostly  canned foods, whatever he could get. He had LE edema and bloating. He had a little more DOE than usual. He denied orthopnea, PND. He responded well to IV Lasix 80mg  x2 and was continued on Bumex 2mg  BID.    He was seen 10/26 for follow-up. His weight was not decreased from prior and LE edema was not improved. He thought this was due to not being able to prop his legs up. He and his wife looked at an apartment. He was still eating canned food. Admitted to having a pork chop biscuit that morning.  He responded well to IV Lasix 80mg  x2.     Since his last visit, he did not increase Bumex to 4mg /2mg  because he is already peeing like a race horse on 2mg  BID. His LE edema has improved. He and his wife have found an apartment.     Past Medical History:   Diagnosis Date   ??? Angina at rest (CMS-[name])    ??? Arthritis    ??? Asthma    ??? Atrial fibrillation and flutter (CMS-[name]) 06/08/14   ??? BMI 40.0-44.9, adult (CMS-[name]) 10/29/2015   ??? Chronic anticoagulation 02/17/2018   ??? Chronic kidney disease, stage 3    ??? Chronic pain syndrome 04/11/2014 ~Use daily lyrica as recommended ~Tramadol as needed for pain ~Fluoxetine daily as recommended ~Daily exercise ~Eat a healthy diet ~Good control of your blood sugars can help    ??? Coronary artery disease    ??? Depression    ??? Diabetes mellitus (CMS-[name])    ??? Eye trauma 1998    glass in both eyes and removed   ??? GERD (gastroesophageal reflux disease)    ??? Gout    ??? Homeless    ??? Hyperlipidemia    ??? Hypertension    ??? Illiteracy and low-level literacy    ??? Microscopic hematuria    ??? Migraine    ??? Nephrolithiasis    ??? 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 Nonalcoholic fatty liver disease    ??? NSTEMI (non-ST elevated myocardial infarction) (CMS-[name]) 01/13/2017   ??? Obesity    ??? Obstructive sleep apnea 2009   ??? Panic attacks    ??? 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 Seizure-like activity (CMS-[name])     ~Monitor for symptoms and report them to your primary care provider if they occur    ??? Systolic CHF, chronic (CMS-[name])     EF 40-45% 2017   ??? TIA (transient ischemic attack)      Past Surgical History:   Procedure Laterality Date   ??? CARDIAC CATHETERIZATION  03/08/2007   ??? CORONARY STENT PLACEMENT     ??? CYSTOURETHROSCOPY  12/31/2009     Microscopic hematuria   ??? KNEE SURGERY Right 09/23/2006    arthroscopic knee surgery medial and lateral meniscus tears as well as crystal disease   ??? PR CATH PLACE/CORON ANGIO, IMG SUPER/INTERP,R&L HRT CATH, L HRT VENTRIC N/A 02/13/2017    Procedure: Left/Right Heart Catheterization;  Surgeon: Marlaine Hind, MD;  Location: [facility] CATH;  Service: Cardiology ??? PR CATH PLACE/CORON  ANGIO, IMG SUPER/INTERP,W LEFT HEART VENTRICULOGRAPHY N/A 03/17/2017    Procedure: Left Heart Catheterization W Intervention;  Surgeon: Marlaine Hind, MD;  Location: [facility] CATH;  Service: Cardiology   ??? PR CATH PLACE/CORON ANGIO, IMG SUPER/INTERP,W LEFT HEART VENTRICULOGRAPHY N/A 05/12/2018    Procedure: CATH LEFT HEART CATHETERIZATION W INTERVENTION;  Surgeon: Dorathy Kinsman, MD;  Location: [facility] CATH;  Service: Cardiology   ??? PR CATH PLACE/CORON ANGIO, IMG SUPER/INTERP,W LEFT HEART VENTRICULOGRAPHY N/A 10/22/2018    Procedure: CATH LEFT HEART CATHETERIZATION W INTERVENTION;  Surgeon: Marlaine Hind, MD;  Location: [facility] CATH;  Service: Cardiology   ??? PR ECMO/ECLS INITIATION VENO-ARTERIAL N/A 03/19/2017    Procedure: ECMO/ECLS; INITIATION, VENO-ARTERIAL;  Surgeon: Lennie Odor, MD;  Location: MAIN OR [facility];  Service: Cardiac Surgery   ??? PR ECMO/ECLS RMVL PRPH CANNULA OPEN 6 YRS & OLDER Left 03/25/2017    Procedure: ECMO / ECLS PROVIDED BY PHYSICIAN; REMOVAL OF PERIPHERAL (ARTERIAL AND/OR VENOUS) CANNULA(E), OPEN, 6 YEARS AND OLDER;  Surgeon: Alonna Buckler Ikonomidis, MD;  Location: MAIN OR [facility];  Service: Cardiac Surgery   ??? PR EPHYS EVAL W/ ABLATION SUPRAVENT ARRHYTHMIA N/A 07/19/2015    Catheter ablation of cavotricuspid isthmus for atrial flutter [facility])   ??? PR INSER HART PACER XVENOUS ATRIAL N/A 05/30/2015    [employer] dual-chamber pacemaker implant (E.Chung)   ??? PR INSERT/PLACE FLOW DIRECT CATH N/A 03/17/2017    Procedure: Insert Leave In [place];  Surgeon: Marlaine Hind, MD;  Location: [facility] CATH;  Service: Cardiology   ??? PR INSJ/RPLCMT PERM DFB W/TRNSVNS LDS 1/DUAL CHMBR N/A 05/04/2017    Procedure: ICD Implant System (Single/Dual);  Surgeon: Eldred Manges, MD;  Location: [facility] CATH;  Service: Cardiology   ??? PR NEGATIVE PRESSURE WOUND THERAPY DME >50 SQ CM Left 03/25/2017 Procedure: Neg Press Wound Tx (Vac Assist) Incl Topicals, Per Session, Tsa Greater Than/= 50 Cm Squared;  Surgeon: Alonna Buckler Ikonomidis, MD;  Location: MAIN OR [facility];  Service: Cardiac Surgery   ??? PR PRQ TRLUML CORONARY STENT W/ANGIO ONE ART/BRNCH N/A 03/12/2018    Procedure: Percutaneous Coronary Intervention;  Surgeon: Marlaine Hind, MD;  Location: [facility] CATH;  Service: Cardiology   ??? PR REBL VES DIRECT,LOW EXTREM Left 03/19/2017    Procedure: Repr Bld Vessel Direct; Lower Extrem;  Surgeon: Boykin Reaper, MD;  Location: MAIN OR [facility]  Service: Vascular   ??? PR REMV ART CLOT ILIAC-POP,LEG INCIS Left 03/25/2017    Procedure: EMBOLECTOMY OR THROMBECTOMY, WITH OR WITHOUT CATHETER; FEMOROPOPLITEAL, AORTOILIAC ARTERY, BY LEG INCISION;  Surgeon: Alonna Buckler Ikonomidis, MD;  Location: MAIN OR [facility];  Service: Cardiac Surgery   ??? PR UPPER GI ENDOSCOPY,BIOPSY N/A 02/28/2016    Procedure: UGI ENDOSCOPY; WITH BIOPSY, SINGLE OR MULTIPLE;  Surgeon: Liane Comber, MD;  Location: GI [facility];  Service: Gastroenterology   ??? PR UPPER GI ENDOSCOPY,DIAGNOSIS N/A 05/07/2016    Procedure: UGI ENDO, INCLUDE ESOPHAGUS, STOMACH, & DUODENUM &/OR JEJUNUM; DX W/WO COLLECTION SPECIMN, BY BRUSH OR WASH;  Surgeon: Modena Nunnery, MD;  Location: GI [facility]URES [facility];  Service: Gastroenterology       Current Outpatient Medications   Medication Sig Dispense Refill   ??? acetaminophen (TYLENOL) 500 MG tablet TAKE 2 TABLETS (1 000MG ) BY MOUTH EVERY 8 HOURS AS NEEDED FOR PAIN (Patient taking differently: Take 500 mg by mouth every four (4) hours as needed. ) 180 each 3   ??? albuterol HFA 90 mcg/actuation inhaler Inhale 2 puffs into the lungs every  6 (six) hours as needed for wheezing or shortness of breath. 18 g 2   ??? alirocumab (PRALUENT) 150 mg/mL subcutaneous injection Inject 1 mL (150 mg total) under the skin every fourteen (14) days. 4 Syringe 11 ??? allopurinoL (ZYLOPRIM) 100 MG tablet Take 1.5 tablets (150 mg total) by mouth daily. 60 tablet 3   ??? amiodarone (PACERONE) 200 MG tablet TAKE 1 TABLET BY MOUTH ONCE DAILY 90 tablet 3   ??? amLODIPine (NORVASC) 10 MG tablet Take 1 tablet (10 mg total) by mouth daily. 90 tablet 3   ??? arm brace Misc 1 each by Miscellaneous route daily. 1 each 0   ??? atorvastatin (LIPITOR) 80 MG tablet Take 1 tablet (80 mg total) by mouth daily. 90 tablet 3   ??? blood sugar diagnostic Strp Disp to match current ins approved meter. Use to test blood sugar four times daily. E11.22, Z79.4 400 each 3   ??? blood-glucose meter kit Disp. blood glucose meter kit preferred by patient's insurance. Dx: Diabetes, E11.9 1 each 11   ??? bumetanide (BUMEX) 2 MG tablet Take 2 tablets (4 mg total) by mouth daily AND 1 tablet (2 mg total) daily with lunch. 270 tablet 3   ??? carvediloL (COREG) 25 MG tablet Take 1.5 tablets (37.5 mg total) by mouth Two (2) times a day. 270 tablet 3   ??? cholecalciferol, vitamin D3, 5,000 unit capsule Take 1 capsule (5,000 Units total) by mouth daily. 100 capsule 0   ??? diclofenac sodium (VOLTAREN) 1 % gel Apply 2 grams topically Three (3) times a day. 100 g 11   ??? empty container Misc Use as directed to dispose of injectable medications 1 each 3   ??? ezetimibe (ZETIA) 10 mg tablet TAKE 1 TABLET BY MOUTH ONCE DAILY 90 each 3   ??? FLUoxetine (PROZAC) 40 MG capsule Take 2 capsules (80 mg total) by mouth daily. 180 capsule 3   ??? gabapentin (NEURONTIN) 300 MG capsule Take 1 capsule (300 mg total) by mouth two (2) times a day. 180 capsule 3   ??? insulin ASPART (NOVOLOG FLEXPEN U-100 INSULIN) 100 unit/mL (3 mL) injection pen Inject 0.2 mL (20 Units total) under the skin three (3) times a day with a meal. 15 mL 11   ??? insulin detemir U-100 (LEVEMIR) 100 unit/mL (3 mL) injection pen Inject 40 Units total under the skin nightly. 15 mL 3 ??? insulin syringe-needle U-100 0.3 mL 31 gauge x 5/16 Syrg Use to inject insulin twice daily with meals 60 each 0   ??? isosorbide mononitrate (IMDUR) 30 MG 24 hr tablet Take 4 tablets (120 mg total) by mouth daily. 120 tablet 3   ??? lancets Misc Disp. Ins preferred lancets. Use to test blood sugar four times daily. E11.22, Z79.4 400 each 3   ??? loperamide (IMODIUM) 2 mg capsule Take 1 capsule (2 mg total) by mouth 4 (four) times a day as needed for diarrhea. 30 capsule 0   ??? nitroglycerin (NITROSTAT) 0.4 MG SL tablet Place 1 tablet (0.4 mg total) under the tongue every five (5) minutes as needed for chest pain. 100 each 3   ??? omeprazole (PRILOSEC) 40 MG capsule Take 1 capsule (40 mg total) by mouth Two (2) times a day (30 minutes before a meal). 180 capsule 3   ??? QUEtiapine (SEROQUEL) 50 MG tablet Take 50 mg by mouth. Takes prn     ??? rivaroxaban (XARELTO) 15 mg Tab Take 1 tablet (15 mg total) by mouth daily with  evening meal. 90 tablet 3   ??? semaglutide 0.25 mg or 0.5 mg(2 mg/1.5 mL) PnIj Inject 0.5 mg under the skin every seven (7) days. 4.5 mL 3   ??? sevelamer (RENVELA) 800 mg tablet TAKE 2 TABLETS (1600MG ) BY MOUTH THREE TIMES DAILY WITH MEALS 540 tablet 3   ??? sodium polystyrene, SPS, with sorbitol (SPS, WITH SORBITOL,) 15-20 gram/60 mL Susp Take 60 mL (15 g) by mouth once a week. 473 mL 3   ??? UNIFINE PENTIPS 31 gauge x 5/16 Ndle USE WITH INSULIN AND VICTOZA 5 TIMES DAILY 400 each 3   ??? ondansetron (ZOFRAN) 4 MG tablet Take 1 tablet (4 mg total) by mouth every eight (8) hours as needed for nausea for up to 4 days. (Patient not taking: Reported on 01/26/2019) 10 tablet 0   ??? ticagrelor (BRILINTA) 90 mg Tab TAKE 1 TABLET BY MOUTH TWICE DAILY (Patient not taking: Reported on 01/26/2019) 180 tablet 3     Current Facility-Administered Medications   Medication Dose Route Frequency Provider Last Rate Last Dose   ??? furosemide (LASIX) injection 80 mg  80 mg Intravenous Once Montez Hageman, AGNP         Allergies Allergen Reactions   ??? Losartan      Hyperkalemia (K>6)       Objective:       Physical Exam  BP 170/90 (BP Site: L Arm, BP Position: Sitting)  - Pulse 78  - Temp 36.1 ??C (96.9 ??F) (Temporal)  - Ht 190.5 cm (6' 3)  - Wt (!) 160.8 kg (354 lb 6.4 oz)  - SpO2 97%  - BMI 44.30 kg/m??             01/26/19 (!) 160.8 kg (354 lb 6.4 oz)   01/24/19 (!) 165.1 kg (364 lb)   01/20/19 (!) 164.2 kg (362 lb 1.6 oz)   01/19/19 (!) 167.5 kg (369 lb 3.2 oz)   01/05/19 (!) 156.1 kg (344 lb 1.6 oz)   12/16/18 (!) 151 kg (333 lb)   12/10/18 (!) 156.8 kg (345 lb 9.6 oz)   12/07/18 (!) 146.9 kg (323 lb 14.4 oz)   12/03/18 (!) 154.3 kg (340 lb 2.7 oz)   11/18/18 (!) 152.4 kg (336 lb)   11/17/18 (!) 152.6 kg (336 lb 6.4 oz)   10/22/18 (!) 153.3 kg (338 lb)   10/15/18 (!) 153.7 kg (338 lb 13.6 oz)   10/07/18 (!) 153.8 kg (339 lb)   09/29/18 (!) 155.7 kg (343 lb 3.2 oz)   09/21/18 (!) 154.4 kg (340 lb 6.2 oz)   08/12/18 (!) 154.4 kg (340 lb 4.8 oz)   07/22/18 (!) 154.5 kg (340 lb 11.2 oz)   07/02/18 (!) 151.2 kg (333 lb 4.8 oz)   06/22/18 (!) 154.7 kg (341 lb 0.8 oz)   05/31/18 (!) 153.2 kg (337 lb 11.9 oz)   05/31/18 (!) 152.2 kg (335 lb 8.6 oz)   05/28/18 (!) 151 kg (333 lb)   05/22/18 (!) 133.5 kg (294 lb 5 oz)   05/20/18 (!) 153 kg (337 lb 6.4 oz)   05/17/18 (!) 160.3 kg (353 lb 6.4 oz)   05/11/18 (!) 158.9 kg (350 lb 6.4 oz)   04/23/18 (!) 161.7 kg (356 lb 6.4 oz)   04/02/18 (!) 153.9 kg (339 lb 4.8 oz)   03/18/18 (!) 151.5 kg (334 lb)     General:  Patient is chronically ill-appearing in no acute distress   Eyes:  Intact, sclerae anicteric.  Ears, nose, mouth: Benign   Respiratory:   Normal respiratory effort. Clear to auscultation bilaterally.  There are no wheezes. Cardiovascular:  Exam limited by thick next. JVP not seen above the clavicle with HOB at 60 degrees.  Rate and rhythm are regular.  There is no lifts or heaves.  Normal S1, S2. There is no murmur, gallops or rubs.  Radial and pedal pulses are 2+, bilaterally.   There is 1+ pitting pedal/pretibial edema, right greater than left.    Gastrointestinal:   Obese, Soft, non-tender, with audible bowel sounds. Abdomen nondistended.  Liver is nonpalpable.   Musculoskeletal: No joint swelling   Skin: Warm, well perfused.   Neurologic: Appropriate mood and affect. Alert and oriented to person, place, and time. No gross motor or sensory deficits evident.     Recent Labs:  Office Visit on 01/26/2019   Component Date Value Ref Range Status   ??? Sodium, POCT 01/26/2019 140  135 - 145 mmol/L Final   ??? Potassium, POCT 01/26/2019 5.3* 3.5 - 5.0 mmol/L Final   ??? Chloride, POCT 01/26/2019 101  98 - 107 mmol/L Final   ??? CO2, POCT 01/26/2019 25  22 - 30 mmol/L Final   ??? BUN, POCT 01/26/2019 27* 7 - 21 mg/dL Final   ??? Creatinine, POCT 01/26/2019 3.9* 0.7 - 1.3 mg/dL Final   ??? Glucose, POCT 01/26/2019 137  70 - 179 mg/dL Final   ??? Calcium, POCT 01/26/2019 8.9  8.5 - 10.2 mg/dL Final   Office Visit on 01/24/2019   Component Date Value Ref Range Status   ??? Magnesium 01/24/2019 1.6  1.6 - 2.2 mg/dL Final   ??? PRO-BNP 91/47/8295 2,090.0* 0.0 - 138.0 pg/mL Final   ??? Sodium, POCT 01/24/2019 139  135 - 145 mmol/L Final   ??? Potassium, POCT 01/24/2019 5.1* 3.5 - 5.0 mmol/L Final   ??? Chloride, POCT 01/24/2019 106  98 - 107 mmol/L Final   ??? CO2, POCT 01/24/2019 24  22 - 30 mmol/L Final   ??? BUN, POCT 01/24/2019 28* 7 - 21 mg/dL Final   ??? Creatinine, POCT 01/24/2019 3.6* 0.7 - 1.3 mg/dL Final   ??? Glucose, POCT 01/24/2019 187* 70 - 179 mg/dL Final   ??? Calcium, POCT 01/24/2019 9.1  8.5 - 10.2 mg/dL Final   Office Visit on 01/20/2019   Component Date Value Ref Range Status   ??? Magnesium 01/20/2019 1.7  1.6 - 2.2 mg/dL Final ??? PRO-BNP 62/13/0865 2,550.0* 0.0 - 138.0 pg/mL Final   ??? Albumin 01/20/2019 3.2* 3.5 - 5.0 g/dL Final   ??? Total Protein 01/20/2019 6.4* 6.5 - 8.3 g/dL Final   ??? Total Bilirubin 01/20/2019 0.5  0.0 - 1.2 mg/dL Final   ??? Bilirubin, Direct 01/20/2019 0.30  0.00 - 0.40 mg/dL Final   ??? AST 78/46/9629 14* 19 - 55 U/L Final   ??? ALT 01/20/2019 9  <50 U/L Final   ??? Alkaline Phosphatase 01/20/2019 93  38 - 126 U/L Final   ??? Sodium 01/20/2019 139  135 - 145 mmol/L Final   ??? Potassium 01/20/2019 5.7* 3.5 - 5.0 mmol/L Final   ??? Chloride 01/20/2019 109* 98 - 107 mmol/L Final   ??? CO2 01/20/2019 27.0  22.0 - 30.0 mmol/L Final   ??? Anion Gap 01/20/2019 3* 7 - 15 mmol/L Final   ??? BUN 01/20/2019 33* 7 - 21 mg/dL Final   ??? Creatinine 01/20/2019 3.73* 0.70 - 1.30 mg/dL Final   ??? BUN/Creatinine Ratio 01/20/2019 9  Final   ??? EGFR CKD-EPI Non-African American,* 01/20/2019 18* >=60 mL/min/1.59m2 Final   ??? EGFR CKD-EPI African American, Male 01/20/2019 21* >=60 mL/min/1.59m2 Final   ??? Glucose 01/20/2019 188* 70 - 179 mg/dL Final   ??? Calcium 14/78/2956 8.5  8.5 - 10.2 mg/dL Final       Lab Results   Component Value Date    PRO-BNP 2,090.0 (H) 01/24/2019    PRO-BNP 2,550.0 (H) 01/20/2019    PRO-BNP 2,290.0 (H) 01/13/2019    PRO-BNP 3,440.0 (H) 09/21/2018    PRO-BNP 3,080.0 (H) 07/22/2018    PRO-BNP 231 (H) 06/08/2014    Creatinine, POCT 3.9 (H) 01/26/2019    Creatinine, POCT 3.6 (H) 01/24/2019    Creatinine 3.73 (H) 01/20/2019    Creatinine 3.43 (H) 01/13/2019    Creatinine 4.31 (H) 12/23/2018    Creatinine 3.44 (H) 12/04/2018    Creatinine 3.58 (H) 11/24/2018    Creatinine 1.30 06/09/2014    Creatinine 1.56 (H) 06/08/2014    Creatinine 1.32 (H) 10/17/2013    Creatinine 1.41 (H) 10/16/2013    Creatinine 1.36 (H) 07/25/2013    BUN 33 (H) 01/20/2019    BUN 28 (H) 01/13/2019    BUN 44 (H) 12/23/2018    BUN 33 (H) 12/04/2018    BUN 33 (H) 11/24/2018    BUN 24 (H) 06/09/2014    BUN 28 (H) 06/08/2014    BUN 16 10/17/2013    BUN 17 10/16/2013 BUN 16 07/25/2013    BUN, POCT 27 (H) 01/26/2019    BUN, POCT 28 (H) 01/24/2019    Magnesium 1.6 01/24/2019    Magnesium 1.7 01/20/2019    Magnesium 1.7 01/13/2019    Magnesium 1.8 12/04/2018    Magnesium 2.0 10/23/2018    Magnesium 2.0 06/09/2014    Magnesium 1.7 05/12/2011    Magnesium 1.8 01/09/2011       Metric Tracker:  Did today's visit result in ED visit? No  Did today's visit result in hospital admission? No  Did today's visit result in referral to cardiology? n/a  Today's visit was a referral from [facility]

## 2019-01-27 DIAGNOSIS — Z794 Long term (current) use of insulin: Principal | ICD-10-CM

## 2019-01-27 DIAGNOSIS — E1122 Type 2 diabetes mellitus with diabetic chronic kidney disease: Principal | ICD-10-CM

## 2019-01-27 DIAGNOSIS — G894 Chronic pain syndrome: Principal | ICD-10-CM

## 2019-01-27 DIAGNOSIS — E875 Hyperkalemia: Principal | ICD-10-CM

## 2019-01-27 DIAGNOSIS — M150 Primary generalized (osteo)arthritis: Principal | ICD-10-CM

## 2019-01-27 DIAGNOSIS — N184 Chronic kidney disease, stage 4 (severe): Principal | ICD-10-CM

## 2019-01-27 DIAGNOSIS — E7800 Pure hypercholesterolemia, unspecified: Principal | ICD-10-CM

## 2019-01-27 MED FILL — OZEMPIC 0.25 MG OR 0.5 MG (2 MG/1.5 ML) SUBCUTANEOUS PEN INJECTOR: 84 days supply | Qty: 4 | Fill #0 | Status: AC

## 2019-01-27 NOTE — Unmapped (Signed)
Recent:   What is the date of your last related visit?  n/a  Related acute medications Rx'd:  n/a  Home treatment tried:  Tylenol 2 tab 2100    Relevant:   Allergies: Losartan  Medications: n/a  Health History: Heart problems fluid on legs  Weight: n/a      Reason for Disposition  ??? Numbness (i.e., loss of sensation) in hand or fingers (Exception: just tingling; numbness present > 2 weeks)    Answer Assessment - Initial Assessment Questions  1. ONSET: When did the pain start?      3 hours ago. Cramps in hans that come and go  2. LOCATION: Where is the pain located?      Both hands  3. PAIN: How bad is the pain? (Scale 1-10; or mild, moderate, severe)    - MILD (1-3): doesn't interfere with normal activities    - MODERATE (4-7): interferes with normal activities (e.g., work or school) or awakens from sleep    - SEVERE (8-10): excruciating pain, unable to use hand at all      10/10 when lock up. Wife moves them for him and during call resolved.   4. WORK OR EXERCISE: Has there been any recent work or exercise that involved this part of the body?      No  5. CAUSE: What do you think is causing the pain?      I dont know Had Lasix today and fluid removed.  6. AGGRAVATING FACTORS: What makes the pain worse? (e.g., using computer)     Comes and goes. Cant move when it locks up'  7. OTHER SYMPTOMS: Do you have any other symptoms? (e.g., neck pain, swelling, rash, numbness, fever)      2 fingers on right hand numb not new since he had his heart attack-4th, 5th digits  2 yrs ago  8. PREGNANCY: Is there any chance you are pregnant? When was your last menstrual period?      n/a    Protocols used: HAND AND WRIST PAIN-A-AH

## 2019-01-27 NOTE — Unmapped (Signed)
CCM Follow-up Call    PCP Action:None     Triage called after 11pm last night re:   Ongoing bilateral hand pain.    Pt states the pain Usually only lasts 5-10 minutes.  Last night hands were cramping up extra badly, and would not end- so called triage nurse.  Today pt states feeling better , denies need to be seen.  Pt's wife states he is taking the gabapentin and all of his prescribed meds.  Labs were drawn yesterday.    No other complaints today other than wife complaining about communication w/her prior to her 4pm appt. Notified management.

## 2019-01-27 NOTE — Unmapped (Signed)
I was the supervising physician in the delivery of the service. Ninfa Giannelli D Pablo Stauffer, MD

## 2019-01-28 NOTE — Unmapped (Signed)
Pt doing better today after triage call lastnight re: bilateral hand pain.  Denies need to be seen in [facility].

## 2019-01-30 NOTE — Unmapped (Addendum)
INTERNAL MEDICINE COUNSELING SESSION NOTE    LENGTH OF SESSION:  Psychotherapy Session 45 minutes    TYPE OF PSYCHOTHERAPY: Behavioral, Supportive    REASON FOR TREATMENT: Problem Solving Treatment and Mind Body Stress Reduction skill training for??impact of health issues,??stress and self care management; link impact of effort to mood.        Patient participated in visit from his car  Patient indicated that:      they felt safe in their setting to engage in counseling visit.    I spent 45 minutes on the real-time audio and video with the patient. I spent an additional 5 minutes on pre- and post-visit activities.     The patient was physically located in [place] or a state in which I am permitted to provide care. The patient and/or parent/guardian understood that s/he Duross incur co-pays and cost sharing, and agreed to the telemedicine visit. The visit was reasonable and appropriate under the circumstances given the patient's presentation at the time.    The patient and/or parent/guardian has been advised of the potential risks and limitations of this mode of treatment (including, but not limited to, the absence of in-person examination) and has agreed to be treated using telemedicine. The patient's/patient's family's questions regarding telemedicine have been answered.     If the visit was completed in an ambulatory setting, the patient and/or parent/guardian has also been advised to contact their provider???s office for worsening conditions, and seek emergency medical treatment and/or call 911 if the patient deems either necessary.             REVIEW OF FUNCTIONING SINCE LAST VISIT    MOOD (0-10) =  sore  Related to hip pain   10  of 10 with 10 being the best. At last visit was   9    SATISFACTION WITH EFFORT IN CARING FOR MOOD (0-10) =    8 of 10 with 10 being the best.  At last visit was   10    PAIN:     9  Related to hip and back pain  At last visit was    0    PHQ-9 SCORE:     2  At last visit was  0 GAD-7 SCORE:     3  At last visit was    0      CURRENT SUICIDAL/HOMICIDAL IDEATION:   Denies SI / HI      PROBLEM LIST:    Patient Active Problem List   Diagnosis   ??? Idiopathic chronic gout of left knee   ??? Hypercholesteremia   ??? Essential hypertension (RAF-[name])   ??? 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 Osteoarthritis   ??? Chronic nonalcoholic liver disease   ??? GERD (gastroesophageal reflux disease)   ??? Mild intermittent asthma without complication   ??? Chronic pain syndrome   ??? Paroxysmal atrial fibrillation (CMS-[name])   ??? OSA (obstructive sleep apnea)   ??? Tobacco use disorder   ??? Status post cardiac pacemaker procedure   ??? Diabetes mellitus with gastroparesis (CMS-[name])   ??? Cardiac pacemaker in situ   ??? Recurrent major depressive disorder, in full remission (CMS-[name])   ??? NSTEMI (non-ST elevated myocardial infarction) (CMS-[name])   ??? Adjustment disorder, unspecified   ??? Hyperkalemia   ??? CKD (chronic kidney disease) stage 4, GFR 15-29 ml/min (CMS-[name])   ??? Acquired left foot drop   ???  Decreased sensation of lower extremity, left   ??? Chronic anticoagulation   ??? Type 2 diabetes mellitus with stage 4 chronic kidney disease, with long-term current use of insulin (CMS-[name])   ??? Situational stress   ??? Ulcer of left foot with fat layer exposed (CMS-[name])         MEDICATION COMPLIANCE:     Taking all medications regularly?  yes     Any changes in medication since last visit?  yes - lidocaine patches, muscle relaxants,  and gabapentin.    Any missed doses? yes       Current Outpatient Medications on File Prior to Visit   Medication Sig Dispense Refill   ??? acetaminophen (TYLENOL) 500 MG tablet TAKE 2 TABLETS (1 000MG ) BY MOUTH EVERY 8 HOURS AS NEEDED FOR PAIN (Patient taking differently: Take 500 mg by mouth every four (4) hours as needed. ) 180 each 3 ??? albuterol HFA 90 mcg/actuation inhaler Inhale 2 puffs into the lungs every 6 (six) hours as needed for wheezing or shortness of breath. 18 g 2   ??? alirocumab (PRALUENT) 150 mg/mL subcutaneous injection Inject 1 mL (150 mg total) under the skin every fourteen (14) days. 4 Syringe 11   ??? allopurinoL (ZYLOPRIM) 100 MG tablet Take 1.5 tablets (150 mg total) by mouth daily. 60 tablet 3   ??? amiodarone (PACERONE) 200 MG tablet TAKE 1 TABLET BY MOUTH ONCE DAILY 90 tablet 3   ??? amLODIPine (NORVASC) 10 MG tablet Take 1 tablet (10 mg total) by mouth daily. 90 tablet 3   ??? arm brace Misc 1 each by Miscellaneous route daily. 1 each 0   ??? atorvastatin (LIPITOR) 80 MG tablet Take 1 tablet (80 mg total) by mouth daily. 90 tablet 3   ??? blood sugar diagnostic Strp Disp to match current ins approved meter. Use to test blood sugar four times daily. E11.22, Z79.4 400 each 3   ??? blood-glucose meter kit Disp. blood glucose meter kit preferred by patient's insurance. Dx: Diabetes, E11.9 1 each 11   ??? bumetanide (BUMEX) 2 MG tablet Take 2 tablets (4 mg total) by mouth daily AND 1 tablet (2 mg total) daily with lunch. 270 tablet 3   ??? carvediloL (COREG) 25 MG tablet Take 1.5 tablets (37.5 mg total) by mouth Two (2) times a day. 270 tablet 3   ??? [EXPIRED] cephalexin (KEFLEX) 500 MG capsule Take 1 capsule (500 mg total) by mouth Three (3) times a day for 14 days. 42 capsule 0   ??? cholecalciferol, vitamin D3, 5,000 unit capsule Take 1 capsule (5,000 Units total) by mouth daily. 100 capsule 0   ??? diclofenac sodium (VOLTAREN) 1 % gel Apply 2 grams topically Three (3) times a day. 100 g 11   ??? [EXPIRED] doxycycline (VIBRA-TABS) 100 MG tablet Take 1 tablet (100 mg total) by mouth Two (2) times a day for 14 days. 28 tablet 0   ??? empty container Misc Use as directed to dispose of injectable medications 1 each 3   ??? ezetimibe (ZETIA) 10 mg tablet TAKE 1 TABLET BY MOUTH ONCE DAILY 90 each 3 ??? FLUoxetine (PROZAC) 40 MG capsule Take 2 capsules (80 mg total) by mouth daily. 180 capsule 3   ??? gabapentin (NEURONTIN) 300 MG capsule Take 1 capsule (300 mg total) by mouth two (2) times a day. 180 capsule 3   ??? insulin ASPART (NOVOLOG FLEXPEN U-100 INSULIN) 100 unit/mL (3 mL) injection pen Inject 0.2 mL (20 Units total) under the  skin three (3) times a day with a meal. 15 mL 11   ??? insulin detemir U-100 (LEVEMIR) 100 unit/mL (3 mL) injection pen Inject 40 Units total under the skin nightly. 15 mL 3   ??? insulin syringe-needle U-100 0.3 mL 31 gauge x 5/16 Syrg Use to inject insulin twice daily with meals 60 each 0   ??? isosorbide mononitrate (IMDUR) 30 MG 24 hr tablet Take 4 tablets (120 mg total) by mouth daily. 120 tablet 3   ??? lancets Misc Disp. Ins preferred lancets. Use to test blood sugar four times daily. E11.22, Z79.4 400 each 3   ??? loperamide (IMODIUM) 2 mg capsule Take 1 capsule (2 mg total) by mouth 4 (four) times a day as needed for diarrhea. 30 capsule 0   ??? [EXPIRED] metroNIDAZOLE (FLAGYL) 500 MG tablet Take 1 tablet (500 mg total) by mouth Three (3) times a day for 14 days. (Patient not taking: Reported on 01/20/2019) 42 tablet 0   ??? nitroglycerin (NITROSTAT) 0.4 MG SL tablet Place 1 tablet (0.4 mg total) under the tongue every five (5) minutes as needed for chest pain. 100 each 3   ??? omeprazole (PRILOSEC) 40 MG capsule Take 1 capsule (40 mg total) by mouth Two (2) times a day (30 minutes before a meal). 180 capsule 3   ??? ondansetron (ZOFRAN) 4 MG tablet Take 1 tablet (4 mg total) by mouth every eight (8) hours as needed for nausea for up to 4 days. (Patient not taking: Reported on 01/26/2019) 10 tablet 0   ??? QUEtiapine (SEROQUEL) 50 MG tablet Take 50 mg by mouth. Takes prn     ??? rivaroxaban (XARELTO) 15 mg Tab Take 1 tablet (15 mg total) by mouth daily with evening meal. 90 tablet 3   ??? semaglutide 0.25 mg or 0.5 mg(2 mg/1.5 mL) PnIj Inject 0.5 mg under the skin every seven (7) days. 4.5 mL 3 ??? sevelamer (RENVELA) 800 mg tablet TAKE 2 TABLETS (1600MG ) BY MOUTH THREE TIMES DAILY WITH MEALS 540 tablet 3   ??? [EXPIRED] sodium hypochlorite (DAKIN'S, QUARTER STRENGTH,) 0.125 % Soln Apply topically as directed 1 Bottle 0   ??? sodium polystyrene, SPS, with sorbitol (SPS, WITH SORBITOL,) 15-20 gram/60 mL Susp Take 60 mL (15 g) by mouth once a week. 473 mL 3   ??? ticagrelor (BRILINTA) 90 mg Tab TAKE 1 TABLET BY MOUTH TWICE DAILY (Patient not taking: Reported on 01/26/2019) 180 tablet 3   ??? UNIFINE PENTIPS 31 gauge x 5/16 Ndle USE WITH INSULIN AND VICTOZA 5 TIMES DAILY 400 each 3   ??? [DISCONTINUED] liraglutide (VICTOZA 2-PAK) 0.6 mg/0.1 mL (18 mg/3 mL) injection Inject 0.1 mL (0.6 mg total) under the skin daily. 3 mL 1   ??? [DISCONTINUED] ranolazine (RANEXA) 500 MG 12 hr tablet Take by mouth two (2) times a day. 60 tablet 0   ??? [DISCONTINUED] traZODone (DESYREL) 50 MG tablet Take 1 tablet (50 mg total) by mouth nightly. 90 tablet 3     Current Facility-Administered Medications on File Prior to Visit   Medication Dose Route Frequency Provider Last Rate Last Dose   ??? furosemide (LASIX) injection 80 mg  80 mg Intravenous Once Montez Hageman, AGNP               SLEEPING:    alright  Staying in the car makes it challenging  Stayed at nephew's for the last 10 days  Pain kept him up last night  APPETITE:    so-so off and on  Nausea 2x in the last couple of weeks and vomiting 1x. He didn't have nausea medicine with him at the time.          SUBSTANCE USE:  yes         SESSION FOCUS:  Addressed:  1. Situational stress    2. Adjustment disorder, unspecified type        Discussed efforts since last contact.    Making sure he and his wife get to doctors appointment  Taking medicine                          Today's Problem Solving addressed:    Reviewed patient's efforts and linked impact of effort to   1. Situational stress    2. Adjustment disorder, unspecified type        Addressed: In process of fixing building up on one nephew's property. Work is being done by 2 nephews.  Has 4 nephews Aurelio Brash, Vinetta Bergamo, Smiley Houseman)  Jeremy Oliver is the youngest. 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  Nephew put in a toilet. Putting in insulation and some other improvements./ repairs. Working on setting up a graveled drive way  Has been staying at [employer]. Joey and Jeremy Oliver helping him.  Three rooms (2 at 15 x20 and 1 at 15 x15). Hope to move in, in the next couple of weeks  He'll need to pay electricity.  Building is near [place] [facility] which means he'll have support nearby if needed.  He thinks wife Louis've had a stroke. Head CT scheduled.  Legs swelled up with fluid so he hasn't been able to walk. Gained a lot of weight with fluid.  I'm having a hard time. I won't lie.  Explored and discussed impact.   Discussed benefit of having some housing through winter and spring.  I reflected negative thinking and harsh self-judgment presented related to his needing to rely on others. I helped him to think about their care for him. This helped him recall times he's helped nephews in the past.      Additional:     Medication adherence and barriers to the treatment plan have been addressed. Opportunities to optimize healthy behaviors have been discussed. Patient / caregiver voiced understanding.    Older sister almost 56 caring for his dog along with her dogs.      ASSESSMENT      ENGAGEMENT: Patient presented a willingness to participate in treatment.  PARTICIPATION QUALITY:    Active  MOOD:   sore  Related to hip pain     AFFECT:  Mildly reactive   MENTAL STATUS:     alert, oriented and - he expressed trying not to cry due to pain. He preferred to have visit rather than postpone.   MODES OF INTERVENTION used in this session:    Clarification, Exploration, Problem Solving, Support, Reframing or Week review      TREATMENT PLAN Identified treatment goals:????improve self care management, develop / improve stress management skills or healthy sleep hygiene  ??  Patient-stated health goal:????support    Problem Solving Treatment and Mind Body Stress Reduction Skill Training:  Next visit will be   12 of up to 12 visits.    RCO Wed Nov 18 Doximity at 2pm

## 2019-01-31 ENCOUNTER — Telehealth: Admit: 2019-01-31 | Discharge: 2019-02-01 | Payer: MEDICARE | Attending: Clinical | Primary: Clinical

## 2019-01-31 ENCOUNTER — Ambulatory Visit: Admit: 2019-01-31 | Discharge: 2019-02-01 | Payer: MEDICARE | Attending: Internal Medicine | Primary: Internal Medicine

## 2019-01-31 DIAGNOSIS — I10 Essential (primary) hypertension: Principal | ICD-10-CM

## 2019-01-31 DIAGNOSIS — G5702 Lesion of sciatic nerve, left lower limb: Principal | ICD-10-CM

## 2019-01-31 DIAGNOSIS — E1122 Type 2 diabetes mellitus with diabetic chronic kidney disease: Principal | ICD-10-CM

## 2019-01-31 DIAGNOSIS — I480 Paroxysmal atrial fibrillation: Principal | ICD-10-CM

## 2019-01-31 DIAGNOSIS — Z794 Long term (current) use of insulin: Principal | ICD-10-CM

## 2019-01-31 DIAGNOSIS — N184 Chronic kidney disease, stage 4 (severe): Principal | ICD-10-CM

## 2019-01-31 DIAGNOSIS — F17200 Nicotine dependence, unspecified, uncomplicated: Principal | ICD-10-CM

## 2019-01-31 MED ORDER — LIDOCAINE 5 % TOPICAL PATCH
MEDICATED_PATCH | Freq: Two times a day (BID) | TRANSDERMAL | 0 refills | 14 days | Status: CP
Start: 2019-01-31 — End: 2019-02-14

## 2019-01-31 MED ORDER — CYCLOBENZAPRINE 5 MG TABLET
ORAL_TABLET | Freq: Every day | ORAL | 0 refills | 14 days | Status: CP
Start: 2019-01-31 — End: 2019-02-14
  Filled 2019-01-31: qty 28, 14d supply, fill #0

## 2019-01-31 MED FILL — CYCLOBENZAPRINE 5 MG TABLET: 14 days supply | Qty: 28 | Fill #0 | Status: AC

## 2019-01-31 NOTE — Unmapped (Signed)
INTERNAL MEDICINE [facility] SAME DAY CLINIC NOTE     01/31/2019    PCP: Kurtis Bushman, MD     Assessment and Plan    Jeremy Oliver was seen today for back pain, hip pain and leg pain.    Diagnoses and all orders for this visit:    Piriformis syndrome of left side    Other orders  -     lidocaine (LIDODERM) 5 % patch; Place 1 patch on the skin every twelve (12) hours for 14 days. Apply to affected area for 12 hours only each day (then remove patch)  -     cyclobenzaprine (FLEXERIL) 5 MG tablet; Take 2 tablets (10 mg total) by mouth daily for 14 days.    On physical exam he had exquisite tenderness at the piriformis with evidence of spasm.  It was very difficult to do physical exam due to the tenderness of the muscle and movements were painful.  He had no red flag symptoms suggesting herniation or radiculopathy.Differential diagnosis includes highly suspicion of piriformis syndrome due to piriformis spasm from increased walking causing sciatica vs bursitis versus spinal stenosis versus disc herniation versus hip arthritis versus piriformis syndrome  -Voltaren gel to the affected area as needed 4 times a day  -continue Tylenol as needed  - lidocaine patches to affected area once a day for 2 weeks  - Flexeril 10 mg daily for 2 weeks   -  increase gabapentin from 300 mg twice daily to 300 mg 3 times daily  -trigger point injection in the piriformis would be beneficial but due to him being on anticoagulation we cannot do the trigger point injection today, will message his primary care provider, Dr. Hulan Fess, for further planning of possible trigger point injection.  -PT referral     Follow up as scheduled or sooner as needed.    Patient was seen and discussed with Dr. Madelon Lips who is in agreement with the assessment and plan as outlined above.       Subjective    Problem List:  Patient Active Problem List   Diagnosis   ??? Idiopathic chronic gout of left knee   ??? Hypercholesteremia   ??? Essential hypertension (RAF-[name]) ??? 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 Osteoarthritis   ??? Chronic nonalcoholic liver disease   ??? GERD (gastroesophageal reflux disease)   ??? Mild intermittent asthma without complication   ??? Chronic pain syndrome   ??? Paroxysmal atrial fibrillation (CMS-[name])   ??? OSA (obstructive sleep apnea)   ??? Tobacco use disorder   ??? Status post cardiac pacemaker procedure   ??? Diabetes mellitus with gastroparesis (CMS-[name])   ??? Cardiac pacemaker in situ   ??? Recurrent major depressive disorder, in full remission (CMS-[name])   ??? NSTEMI (non-ST elevated myocardial infarction) (CMS-[name])   ??? Adjustment disorder, unspecified   ??? Hyperkalemia   ??? CKD (chronic kidney disease) stage 4, GFR 15-29 ml/min (CMS-[name])   ??? Acquired left foot drop   ??? Decreased sensation of lower extremity, left   ??? Chronic anticoagulation   ??? Type 2 diabetes mellitus with stage 4 chronic kidney disease, with long-term current use of insulin (CMS-[name])   ??? Situational stress   ??? Ulcer of left foot with fat layer exposed (CMS-[name])       HPI  Jeremy Oliver is a 48 y.o. year old male with the above problem list who presents to the  same day clinic for   Chief Complaint   Patient presents with   ??? Back Pain     left side lower back pain   ??? Hip Pain     left hip pain   ??? Leg Pain     left leg pain Past medical history of hypertension, CKD, T2DM, A. fib on Xarelto, systolic heart failure, diabetic neuropathy, left foot drop presenting today with left hip back and leg pain that started yesterday that is aching and shooting, stabbing sensation and comes and goes.  He denies any trauma to the area but states he has been walking more than usual.  He says that he was sleeping in his chair when he woke up due to the pain in his left hip it was 8/10.  He could not move it at all.  He said the pain was so bad he was crying.  He states that Voltaren gel and massaging it helped, hot shower and ice made it worse.  He describes the pain as shooting down the leg and he is only tried Tylenol. Denies any bowel or bladder changes, no new numbness or tingling or weakness.       Meds and allergies were reviewed in Epic    ROS: 10 point ROS was performed and is otherwise negative other than mentioned in the HPI    Objective  PE:  Vitals:    01/31/19 0925   BP: 150/79   Pulse: 88   Temp: 35.9 ??C   SpO2: 99%     General: well-appearing in NAD  Eyes: EOMI, sclera clear, PERRL  ENT: OP clear w/o erythema or exudate  CV: regular, no murmurs  Resp: CTAB, no wheezes or crackles, normal WOB  GI: soft, NTND, NABS  MSK: Limited due to pain, muscle strength and sensation intact, range of motion limited by pain, exquisite tenderness to palpation of the piriformis muscle, negative straight great leg test, unable to perform FABER test  Known L foot drop with wound at the bottom of the left foot and healing well  Skin: clean and dry, left foot wound healing well  Ext: no cyanosis/clubbing/edema  Neuro: alert, follows commands. CN II-XII grossly intact      Metric Tracker:  Did today's visit result in referral to ED or direct admission? No    [place]y [place], DO  PGY-1 PMR Resident

## 2019-01-31 NOTE — Unmapped (Signed)
[facility] BPs  BP#1 153/83  BP#2    151/80  BP#3   146/75    Average BP   150/79

## 2019-01-31 NOTE — Unmapped (Signed)
Pt requesting SDC today.  Re: hip pain started last night.    Answer Assessment - Initial Assessment Questions  1. LOCATION and RADIATION: Where is the pain located?       Left hip and lower back   2. QUALITY: What does the pain feel like?  (e.g., sharp, dull, aching, burning)      Aching   3. SEVERITY: How bad is the pain? What does it keep you from doing?   (Scale 1-10; or mild, moderate, severe)    Excruciating pain 8/10   4. ONSET: When did the pain start? Does it come and go, or is it there all the time?      Transient, started last night about 11pm  5. WORK OR EXERCISE: Has there been any recent work or exercise that involved this part of the body?       NO. I have been walking a little more than I used to do  6. CAUSE: What do you think is causing the hip pain?       I have no idea, I haven't fallen or bit hit or anything, all I know is I woke up last night couldn't even move, I was crying late a baby last night it hurt so bad.  7. AGGRAVATING FACTORS: What makes the hip pain worse? (e.g., walking, climbing stairs, running)      just trying to move at all. I was in a comfortable chair yesterday, up and down and no problems, but last night I fell asleep in the chair I had to get [name] to get me up it hurt so bad.  8. OTHER SYMPTOMS: Do you have any other symptoms? (e.g., back pain, pain shooting down leg,  fever, rash)      pain does go down my leg but you know that's the one that's got the drop foot and all- they put life support through my groin and that's why I have the drop foot.    Not taking any extra pain medicine, even tylenol makes me too sleepy  Yesterday pt took Nitroglycerin 0.4mg  sl x1 @ 2pm - 5/10 CP- it was effective,,nitroglycerin effective quickly   Wife checked BP after 147/85 ,  87    Protocols used: HIP PAIN-ADULT-OH

## 2019-01-31 NOTE — Unmapped (Addendum)
You were seen in the clinic today for left back/buttock/hip pain radiating down the leg.  This appears to be a muscle spasm in your piriformis muscle causing sciatica, which is irritation of the nerve causing this pain.  We will start Flexeril 10 mg daily for 2 weeks, apply Voltaren gel to the affected area 4 times a day as needed, continue Tylenol, place lidocaine patch once a day, and increase her gabapentin from 300 mg twice a day to 300 mg 3 times a day.  We also recommend doing physical therapy to help with stretches and exercises for this muscle.  We will refer you to physical therapy.  I will also attach some stretches and exercises that you Alkire do at home to help calm down the muscle and pain.  I will message Dr. Hulan Fess and let her know about that trigger point injection that would be beneficial for the muscle spasm.  If you have worsening symptoms such as new weakness, numbness, tingling, loss of bladder or bowel please come back to clinic for further work-up and management.  Patient Education        Piriformis Syndrome: Care Instructions  Your Care Instructions     The piriformis muscle is deep under your rear end (buttock). One end of the muscle connects deep inside the pelvic area, and the other end attaches to the top of the thighbone. This muscle can press on the sciatic nerve that runs from your spine down your leg. When this happens, you Ammons have pain, numbness, and tingling in the buttock and down the back of your leg. This is called piriformis syndrome. The pain Wisner get worse when you sit for a long time or climb stairs. Also, you Hebard be more likely to develop piriformis syndrome if you run or walk often. Your doctor will check for other causes of your pain before treating this syndrome. Treatment Blaylock include stretching exercises, massage, and medicine for the pain and swelling. If these do not help, you Diodato get a shot of steroid medicine. Until the pain is gone, you Kackley need to rest the muscle and limit activities like running. Exercises and a change in how you move and sit Hearne be enough to stop the pressure on the nerve.  Follow-up care is a key part of your treatment and safety. Be sure to make and go to all appointments, and call your doctor if you are having problems. It's also a good idea to know your test results and keep a list of the medicines you take.  How can you care for yourself at home?  ?? If your doctor thinks that strenuous exercise is causing your problem, stop or cut back on activities such as running. You Kindel find swimming to be a good exercise for a while.  ?? Stretch the piriformis muscle.  ? Lie on your back.  ? Bend one leg at the knee and keep the other leg flat on the ground.  ? Raise your bent knee up and then move it across your body. Hold the outside of the knee with the opposite hand.  ? Gently pull the knee with your hand toward the opposite shoulder.  ? Hold the stretch for at least 15 to 30 seconds. Switch legs.  ? Do the stretch several times each day.  ?? Massage the muscle to relieve pressure.  ? Sit on the floor. Lean to one side so that the hip on your sore side is off the ground. Put a  tennis ball under your buttock on that side.  ? As you put weight onto the tennis ball, you Ohmer find spots that are especially sore. Move gently so that the tennis ball gently massages each of the sore spots.  ?? Use ice or heat to help reduce pain. Put ice or a cold pack or a heating pad set on low or a warm cloth on the sore area for 10 to 20 minutes at a time. Put a thin cloth between the ice pack or heating pad and your skin. ?? Ask your doctor if you can take an over-the-counter pain medicine, such as acetaminophen (Tylenol), ibuprofen (Advil, Motrin), or naproxen (Aleve). Be safe with medicines. Read and follow all instructions on the label.  ?? Have your doctor or a physical therapist watch how you move. You Cannady need physical therapy or special inserts in your shoes (orthotics) to help you move in a way that does not put pressure on your nerves.  When should you call for help?  Watch closely for changes in your health, and be sure to contact your doctor if:  ?? ?? You do not feel better after several weeks of home care.   ?? ?? Your pain gets worse.   ?? ?? Your leg becomes weak or numb.   Where can you learn more?  Go to [facility] at [email or web address]  Select Patient Education under [employer]. Enter [phone] in the search box to learn more about Piriformis Syndrome: Care Instructions.  Current as of: May 31, 2018??????????????????????????????Content Version: 12.6  ?? 2006-2020 [employer], Inc[employer].   Care instructions adapted under license by [facility] If you have questions about a medical condition or this instruction, always ask your healthcare professional. Healthwise, Incorporated disclaims any warranty or liability for your use of this information.         Patient Education        Piriformis Syndrome: Exercises  Introduction  Here are some examples of exercises for you to try. The exercises Buhrman be suggested for a condition or for rehabilitation. Start each exercise slowly. Ease off the exercises if you start to have pain.  You will be told when to start these exercises and which ones will work best for you.  How to do the exercises  Hip rotator stretch   1. Lie on your back with both knees bent and your feet flat on the floor.  2. Put the ankle of your affected leg on your opposite thigh near your knee.  3. Use your hand to gently push your knee (on your affected leg) away from your body until you feel a gentle stretch around your hip. 4. Hold the stretch for 15 to 30 seconds.  5. Repeat 2 to 4 times.  6. Switch legs and repeat steps 1 through 5.    Piriformis stretch   1. Lie on your back with your legs straight.  2. Lift your affected leg and bend your knee. With your opposite hand, reach across your body, and then gently pull your knee toward your opposite shoulder.  3. Hold the stretch for 15 to 30 seconds.  4. Repeat with your other leg.  5. Repeat 2 to 4 times on each side.    Lower abdominal strengthening   1. Lie on your back with your knees bent and your feet flat on the floor.  2. Tighten your belly muscles by pulling your belly button in toward your spine.  3. Lift one foot off the  floor and bring your knee toward your chest, so that your knee is straight above your hip and your leg is bent like the letter L.  4. Lift the other knee up to the same position.  5. Lower one leg at a time to the starting position.  6. Keep alternating legs until you have lifted each leg 8 to 12 times.  7. Be sure to keep your belly muscles tight and your back still as you are moving your legs. Be sure to breathe normally.    Follow-up care is a key part of your treatment and safety. Be sure to make and go to all appointments, and call your doctor if you are having problems. It's also a good idea to know your test results and keep a list of the medicines you take.  Current as of: May 31, 2018??????????????????????????????Content Version: 12.6  ?? 2006-2020 Healthwise, Inc[employer].   Care instructions adapted under license by [facility]. If you have questions about a medical condition or this instruction, always ask your healthcare professional. Healthwise, Incorporated disclaims any warranty or liability for your use of this information.

## 2019-01-31 NOTE — Unmapped (Signed)
Great, Thanks for getting him into [facility] this morning!

## 2019-01-31 NOTE — Unmapped (Signed)
CCM Follow-up Call    PCP Action:pended neurontin 300 mg po tid. please write for lidocaine cream if appropriate. pt states insurance will not cover lidocaine patch.      CCM Action:f/u meds     Pt seen in [facility] today.    This appears to be a muscle spasm in your piriformis muscle causing sciatica, which is irritation of the nerve causing this pain.  We will start Flexeril 10 mg daily for 2 weeks, apply Voltaren gel to the affected area 4 times a day as needed, continue Tylenol, place lidocaine patch once a day, and increase her gabapentin from 300 mg twice a day to 300 mg 3 times a day.  We also recommend doing physical therapy to help with stretches and exercises for this muscle.  We will refer you to physical therapy.      pt requesting updated neurontin order of 300 mg po TID. also requesting lidocaine cream rx

## 2019-02-01 DIAGNOSIS — G5700 Lesion of sciatic nerve, unspecified lower limb: Principal | ICD-10-CM

## 2019-02-02 ENCOUNTER — Ambulatory Visit: Admit: 2019-02-02 | Discharge: 2019-02-02 | Payer: MEDICARE | Attending: Adult Health | Primary: Adult Health

## 2019-02-02 ENCOUNTER — Ambulatory Visit
Admit: 2019-02-02 | Discharge: 2019-02-02 | Payer: MEDICARE | Attending: Foot & Ankle Surgery | Primary: Foot & Ankle Surgery

## 2019-02-02 DIAGNOSIS — E1140 Type 2 diabetes mellitus with diabetic neuropathy, unspecified: Principal | ICD-10-CM

## 2019-02-02 DIAGNOSIS — L97522 Non-pressure chronic ulcer of other part of left foot with fat layer exposed: Principal | ICD-10-CM

## 2019-02-02 DIAGNOSIS — M25375 Other instability, left foot: Principal | ICD-10-CM

## 2019-02-02 DIAGNOSIS — I5042 Chronic combined systolic (congestive) and diastolic (congestive) heart failure: Principal | ICD-10-CM

## 2019-02-02 DIAGNOSIS — E1149 Type 2 diabetes mellitus with other diabetic neurological complication: Principal | ICD-10-CM

## 2019-02-02 DIAGNOSIS — M21372 Foot drop, left foot: Principal | ICD-10-CM

## 2019-02-02 LAB — PRO-BNP: Natriuretic peptide.B prohormone N-Terminal:MCnc:Pt:Ser/Plas:Qn:: 2430 — ABNORMAL HIGH

## 2019-02-02 LAB — MAGNESIUM: Magnesium:MCnc:Pt:Ser/Plas:Qn:: 1.9

## 2019-02-02 MED ORDER — HYDRALAZINE 25 MG TABLET
ORAL_TABLET | Freq: Three times a day (TID) | ORAL | 3 refills | 90 days | Status: CP
Start: 2019-02-02 — End: 2020-02-02

## 2019-02-02 MED ORDER — BUMETANIDE 2 MG TABLET
ORAL_TABLET | Freq: Two times a day (BID) | ORAL | 3 refills | 90.00000 days
Start: 2019-02-02 — End: 2020-02-02

## 2019-02-02 MED FILL — PRALUENT PEN 150 MG/ML SUBCUTANEOUS PEN INJECTOR: SUBCUTANEOUS | 56 days supply | Qty: 4 | Fill #4

## 2019-02-02 MED FILL — NOVOLOG FLEXPEN U-100 INSULIN ASPART 100 UNIT/ML (3 ML) SUBCUTANEOUS: 25 days supply | Qty: 15 | Fill #1 | Status: AC

## 2019-02-02 MED FILL — NOVOLOG FLEXPEN U-100 INSULIN ASPART 100 UNIT/ML (3 ML) SUBCUTANEOUS: SUBCUTANEOUS | 25 days supply | Qty: 15 | Fill #1

## 2019-02-02 MED FILL — UNIFINE PENTIPS 31 GAUGE X 5/16" NEEDLE (8 MM): 80 days supply | Qty: 400 | Fill #3 | Status: AC

## 2019-02-02 MED FILL — PRALUENT PEN 150 MG/ML SUBCUTANEOUS PEN INJECTOR: 56 days supply | Qty: 4 | Fill #4 | Status: AC

## 2019-02-02 MED FILL — UNIFINE PENTIPS 31 GAUGE X 5/16" NEEDLE (8 MM): 80 days supply | Qty: 400 | Fill #3

## 2019-02-02 NOTE — Unmapped (Addendum)
Three Step Wound care:    1)  Use one tsp hibiclens mixed with 2 cups water and spray over wound then gently wipe clean   2)  1 cup white distilled vinegar to 3 cups distilled water - mix and soak on gauze and leave over the wound for 5 minutes (if this burns do 1 cup vinegar to 4 cups of water.)   3)  Then Spray Anasept and/or Dakins to the wound bed and surrounding area and let dry, or apply to a gauze pad then apply to the wound for 5 minutes, before applying the dressing.    WOUND CARE INSTRUCTIONS:   Dressing:     Left fifth toe:    Wet to dry dakins gauze against the wound bed, secure with kerlix and ace wrap       If you have any questions or concerns regarding your wound or wound care, please contact us at the [facility] Wound Healing and Podiatry clinic at [phone].

## 2019-02-02 NOTE — Unmapped (Signed)
Please review chart.  I refilled gabapentin in Oct with refills and looked like it has not been filled yet.  He needs to contact pharmacy to have filled. Lidocaine patches are OTC now. Thanks!

## 2019-02-02 NOTE — Unmapped (Signed)
Gabapentin refilled in Oct 2020 with refills.  Lidocaine patches are OTC.

## 2019-02-02 NOTE — Unmapped (Signed)
IV Diuresis Clinic Discharge Instructions:    MEDICATIONS:  We are changing your medications today.  - START hydralazine 25mg  (1 pill) three times a day for your blood pressure and to help the heart  Call if you have questions about your medications.    IMPORTANT INSTRUCTIONS:  - Weigh yourself daily in the morning; write your weight down and bring it with you next time   - Limit your fluid intake to 2 Liters (half-gallon) per day    - Limit your salt intake to 2-3 grams (2000-3000 mg) per day  - If you have a morning diuresis appointment, don't take your morning diuretic  - If you have an afternoon diuresis appointment, take your morning diuretic    LABS:  We will call you if your labs need attention    NEXT APPOINTMENT:  Return to clinic in 1 month with Dr Andrey Farmer, as scheduled    My office number (c/o De Burrs RN) is [phone] if you need further assistance, or need to schedule with our IV Diuresis clinic in the future.    If you need to reschedule future appointments, please call [phone] or [phone]. After office hours, if you have urgent questions/problems, contact the on-call cardiologist through the hospital operator: [phone].

## 2019-02-02 NOTE — Unmapped (Signed)
Patient Wound Clinic Note    Requesting Attending Physician :  Margarito Liner Heis*  Service Requesting Consult : No service for patient encounter.    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  Peripheral neuropathy discussed and treatment options discussed.  Ulcer care discussed.    Diabetic shoes were discussed and purpose of offloading to prevent foot ulcerations.   Patient was encouraged to call with any questions or concerns, if any problems the patient is to contact the office.    Procedure: left foot, Wound Location 5th toe At this time patient's wound was debrided due to the increase in Devitalized tissue of epidermis/dermis, exudate, callus tissue, and other necrotic tissue impairing proper wound healing as reported in the Objective. All benefits, risks, and alternatives were explained in detail with the patient. A time out was taken prior to the procedure. The area was prepped in standard clinic fashion. The wound was anesthetized withlidocaine topical 4% for 15 minute prior to debridement.. An excisional Sharp, surgical debridement was performed to fully debride and remove all central devitalized and necrotic tissue including subcutaneous tissue. The debridement occurred to the level of subcutaneous tissue until  bleeding granulation tissue was observed in 100% of the wound bed.  This was achieved with the use of a curette, forceps and scalpel. Blood loss was minimal.  Hemostasis was achieved with pressure and the post-debridement measurements are documented in the chart. Patient tolerated procedure well at this time.       WOUND CARE INSTRUCTIONS:        Wash area with Antibacterial liquid soap with one wash cloth, clean the soap off with a clean cloth, and then pat dry with a dry cloth before the dressing change.    Dressing: Silvergel DSD with Dakins cleanse       If you have any questions or concerns regarding your wound or wound care, please contact us at the [facility] Wound Healing and Podiatry clinic at [phone].    Discussed patient deformities today which lead to an uneven weight distribution and increased pressure points on the feet. Discussed the etiology of the deformities. These deformities can lead to hard callus and pre-ulcerative conditions. These are the precursors of foot ulcerations in the presence of neuropathy. Patient has a condition in which previous conditions has lead to the loss of mechanical function of multiple joints and/or a disruption in the weightbearing surface. Patient as this time has a uneven distribution of weight onto areas that lead to breakdown. Their orthopedic status and need to offload, build in custom modifications to re-distribute the weight is indicated. This is in the form of left lower extremity AFO with the diabetic accommodative insert with offloading third metatarsal due to the shearing force and plantarflexed third metatarsal.  And also prominent left fourth fifth toe.    Patient also needs diabetic inserts for  the right foot due to significant diabetic neuropathy.      Patient will have some out of pocket expenses and once they are resolved they can go ahead with the brace. Patient doing better at this time.     Subjective: Jeremy Oliver  Is being seen for a follow up visit with wounds. Patient finished doxycyline and keflex. Patient utilizing triple antibiotic cream. Patient states the wound is much improving at this time. However, feels that the brace was the biggest issue in why he broke down. Patient sugars are semi controlled. He is doing okay today plantar foot wound has improved. Denies any constitutional infection symtpoms           Review of Systems:   CONSTITUTIONAL:  Patient denies fevers, chills, night sweats and weight changes.  HEENT: Patient denies any visual symptoms.  No difficulties with hearing.  No symptoms of rhinitis or sore throat.  CARDIOVASCULAR:  Patient denies chest pains, palpitations, orthopnea   RESPIRATORY:  No dyspnea on exertion, no wheezing or cough.  GI:  No nausea, vomiting, diarrhea, constipation, abdominal pain.  MUSCULOSKELETAL:  Left foot drop and weakness  NEUROLOGIC:  No chronic headaches, no seizures.  Patient admits to numbness, and denies tingling or weakness.  DERMATOLOGIC:  Patient Admits to foot wounds        Left foot    OBJECTIVE: Constitutional :   - BP 165/95  - Pulse 89  - Temp 36.3 ??C (97.4 ??F) (Temporal)   ?? Patient Appears:  Adequately developed, Well nourished, Well groomed    Psychiatric: Alert and Oriented to Person, Place, and Time. Pt is Cooperative, Pleasant, and Understanding    Cardiovascular: Dorsalis and Posterior Tibial pulses were palpable bilateral Lower extremity measured as a 2/4, minor edema noted to the lower extremities bilateral.     Integumentary: Skin shows callus formation at this time located at left foot plantar foot with no underlying wound notable left 5th toe Wound measurements are noted below improving but underlying subcutaneous tissue.   Thick eschar devitalized tissue present in the plantar aspect of the foot swelling and erythema appreciated skin is of reduced appropriate texture and turgor bilateral lower extremity. No subcutaneous nodules or cystic type lesions observed to both lower extremities.     Neurologic: 10 Point SWMF testing was negative to all ten points tested to both feet. Vibratory Sensation is negative to the bilateral lower extremities.       Musculoskeletal: General appearance of the right and left LE reveals the following deformities: Foot drop left. Gait Examination reveals 3/5 MMT to dorsiflexion and eversion left lower extremity 5 out of 5 to all muscle groups on the lower extremity on the right.Marland Kitchen ROM was reduced to the ankle but adequate to pedal joints.         Wound 01/07/19 Foot plantar left  (Active)   Wound Image   02/02/19 0845   Wound Status Not Healed 02/02/19 0845   Pain 6 02/02/19 0845   Dressing Status      Removed 02/02/19 0845   Wound Length (cm) 0 cm 02/02/19 0845   Wound Width (cm) 0 cm 02/02/19 0845   Wound Depth (cm) 0 cm 02/02/19 0845   Wound Surface Area (cm^2) 0 cm^2 02/02/19 0845   Wound Volume (cm^3) 0 cm^3 02/02/19 0845   Wound Healing % 100 02/02/19 0845   Wound Bed Dull;Brown 01/07/19 0912   Odor None 01/07/19 0912   Margins Defined edges 01/07/19 0912 Peri-wound  Assessment      Edema 01/07/19 0912   Encounter Initial 01/07/19 0912   Progress Initial Exam 01/07/19 0912   Slough % None 01/07/19 0912   Eschar % 76-100% 01/07/19 0912   Epithelialization % None 01/07/19 0912   Granulation % None 01/07/19 0912   Exudate Amnt      None 02/02/19 0845   Thickness Eschar Covered 01/07/19 0912   Exposed Structure N/A 01/07/19 0912   Texture Callus;Edema 02/02/19 0845   Moisture Dry/Scaly 01/07/19 0912   Temperature WNL 01/07/19 0912   Hypergranuation No 01/07/19 0912   Tunneling      No 01/07/19 0912   Undermining     No 01/07/19 0912   Sinus Tract No 01/07/19 0912   Treatments Cleansed/Irrigation 01/07/19 0912   Picture Taken Yes 01/07/19 0912   Length (Pre Debridement) 2.3 cm 01/07/19 0912   Width (Pre Debridement) 2.4 cm 01/07/19 0912   Depth (Pre Debridement) 0.1 cm 01/07/19 0912   Pre-Procedure Pain 0 01/07/19 0912   Time Out Taken Yes 01/07/19 0912   Instrument Used scalpel;curette 01/07/19 0912   Tissue/Material Removed non-viable;callus 01/07/19 0912   Bleeding moderate 01/07/19 0912   Bleeding controlled with pressure 01/07/19 0912   Specimen Taken none 01/07/19 0912   Type of Debridement surgical 01/07/19 0912   Level of Debridement skin, epidermis 01/07/19 0912   Procedural Pain 0 01/07/19 0912   Length (Post-Debridement) 2.3 cm 01/07/19 0912   Width (Post Debridement) 2.4 cm 01/07/19 0912   Depth (Post-Debridement) 0.1 cm 01/07/19 0912   Post Procedural Pain 0 01/07/19 0912   Area(Pre-Debridement) 5.52 sq cm 01/07/19 0912   Area(Post-Debridement) 5.52 sq cm 01/07/19 0912   Volume(Pre-Debridement) 0.55 sq cm 01/07/19 0912   Volume(Post-Debridement) 0.55 sq cm 01/07/19 0912       Wound 01/07/19 Toe (Comment which one) left 5th toe (Active)   Wound Image   02/02/19 0848   Wound Status Not Healed 02/02/19 0848   Pain 6 02/02/19 0848   Dressing Status      Removed 02/02/19 0848   Wound Length (cm) 0 cm 02/02/19 0848   Wound Width (cm) 0 cm 02/02/19 0848 Wound Depth (cm) 0 cm 02/02/19 0848   Wound Surface Area (cm^2) 0 cm^2 02/02/19 0848   Wound Volume (cm^3) 0 cm^3 02/02/19 0848   Wound Healing % 100 02/02/19 0848   Wound Bed Brown;Dull 01/07/19 0912   Odor None 01/07/19 0912   Margins Defined edges 01/07/19 0912   Peri-wound Assessment      Dry;Edema 01/07/19 0912   Encounter Initial 01/07/19 0912   Progress Initial Exam 01/07/19 0912   Slough % None 01/07/19 0912   Eschar % 76-100% 01/07/19 0912   Epithelialization % None 01/07/19 0912   Granulation % None 01/07/19 0912   Exudate Amnt      None 01/07/19 0912   Thickness Full Thickness 01/07/19 0912   Exposed Structure N/A 01/07/19 0912   Texture Callus 02/02/19 0848   Moisture Dry/Scaly 01/07/19 0912   Temperature WNL 01/07/19 0912   Hypergranuation No 01/07/19 0912   Tunneling      No 01/07/19 0912   Undermining     No 01/07/19 0912   Sinus Tract No 01/07/19 0912   Treatments Cleansed/Irrigation 01/07/19 0912   Picture Taken Yes 01/07/19 0912   Length (Pre Debridement) 1.3 cm 01/07/19 0912   Width (Pre Debridement) 1.4 cm 01/07/19 0912   Depth (Pre Debridement) 0.1 cm 01/07/19 0912  Pre-Procedure Pain 0 01/07/19 0912   Time Out Taken Yes 01/07/19 0912   Instrument Used scalpel;curette 01/07/19 0912   Tissue/Material Removed non-viable;callus;slough 01/07/19 0912   Bleeding moderate 01/07/19 0912   Bleeding controlled with pressure 01/07/19 0912   Specimen Taken none 01/07/19 0912   Type of Debridement surgical 01/07/19 0912   Level of Debridement skin, dermis 01/07/19 0912   Procedural Pain 0 01/07/19 0912   Length (Post-Debridement) 1.3 cm 01/07/19 0912   Width (Post Debridement) 1.4 cm 01/07/19 0912   Depth (Post-Debridement) 0.1 cm 01/07/19 0912   Post Procedural Pain 0 01/07/19 0912   Area(Pre-Debridement) 1.82 sq cm 01/07/19 0912   Area(Post-Debridement) 1.82 sq cm 01/07/19 0912   Volume(Pre-Debridement) 0.18 sq cm 01/07/19 0912   Volume(Post-Debridement) 0.18 sq cm 01/07/19 0912

## 2019-02-02 NOTE — Unmapped (Signed)
[place] IV [facility]uresis Clinic Note    Primary Cardiologist/Cardiology Provider(s):  Dr Andrey Farmer    PCP:  Kurtis Bushman, MD    Last Cardiology Clinic Visit:  11/18/2018  Last IV Diuresis Visit: 01/26/19    Reason for Visit:  Jeremy Oliver is a 48 y.o. male with a history of systolic heart failure, who is being seen today in the [facility] IV [facility]uresis Clinic for an urgent visit for volume status optimization including IV diuresis.    Assessment/Plan:  1. Acute on Chronic systolic heart failure  - Volume status today is stable. 365.7 --> 366.3 --> 357.3 --> 358.9  - IV Lasix  was not administered during clinic given improvement in volume status  - Labs were obtained: Cr 4.4, K 4.8  - Continue Bumex 2mg  BID  - Continue amlodipine 10mg   - Start hydralazine 25mg  TID for better afterload reduction  - Discussed that he really need to cut down on sodium and try propping his legs as much as he can  - Homeless - currently working towards getting an apartment, says he has found one.     Response to IV diuresis:         Medications Heart rate Blood pressure Weight (lbs)   Pre  90 150/84 358.9   Hour 0 IV Lasix 0 mg      Hour 2 IV Lasix 0 mg      Post         Output:  I/O     None          Return to clinic:    PCP next week  Return in about 1 month (around 03/04/2019) for already scheduled appointment.    Future Appointments   Date Time Provider Department Center   02/09/2019  3:15 PM Scot Jun [place], [place] [facility]   02/16/2019  2:00 PM Diane Mayer Masker, LCSW [facility]   03/03/2019  1:30 PM Marlaine Hind, MD [facility]   03/09/2019 10:50 AM Karen Chafe, DPM [facility]   03/15/2019  9:10 AM Roma Schanz, MD [facility]   04/07/2019  9:30 AM Mila Merry, MD [facility]   04/26/2019  8:15 AM [facility] EP REMOTE MONITORING [facility]   06/20/2019  8:30 AM Monica Martinez, MD [facility] 06/27/2019  8:30 AM Monica Martinez, MD [facility]   07/26/2019  8:15 AM [place] EP REMOTE MONITORING [facility]       History of Present Illness:  Jeremy Oliver is a 48 y.o. male with a history of systolic heart failure who presents today for IV diuresis. Patient last seen in clinic by his PCP 10/21. He was volume up on exam and was referred for IV Diuresis. At his visit 10/22, pt stated that his weight was up to 365lbs from baseline of about 330lbs. This was likely in context of being homeless and living out of his car; he was eating mostly canned foods, whatever he could get. He had LE edema and bloating. He had a little more DOE than usual. He denied orthopnea, PND. He responded well to IV Lasix 80mg  x2 and was continued on Bumex 2mg  BID.    He was seen 10/26 for follow-up. His weight was not decreased from prior and LE edema was not improved. He thought this was due to not being able to prop his legs up. He and his wife looked at  an apartment. He was still eating canned food. Admitted to having a pork chop biscuit that morning.  He responded well to IV Lasix 80mg  x2.  Amlodipine was increased to 10mg .     He was last seen 10/28 in follow-up.  He did not increase Bumex to 4mg /2mg  because he was already peeing like a race horse on 2mg  BID. His LE edema had improved. He and his wife had found an apartment. He was diuresed with IV Lasix 80mg  x2 and continued on Bumex 2mg  BID.     Today he presesnts for follow-up. His BP is marginally improved. Weight is stable from last visit, as is LE edema. He feels great. He denies dyspnea, orthopnea, paroxysmal nocturnal dyspnea, palpitations, chest pain, dizziness, pre-syncope or syncope. He reports a good appetite without bloating, abd distension or early satiety.       Past Medical History:   Diagnosis Date   ??? Angina at rest (CMS-[name])    ??? Arthritis    ??? Asthma    ??? Atrial fibrillation and flutter (CMS-[name]) 06/08/14 ??? BMI 40.0-44.9, adult (CMS-[name]) 10/29/2015   ??? Chronic anticoagulation 02/17/2018   ??? Chronic kidney disease, stage 3    ??? Chronic pain syndrome 04/11/2014    ~Use daily lyrica as recommended ~Tramadol as needed for pain ~Fluoxetine daily as recommended ~Daily exercise ~Eat a healthy diet ~Good control of your blood sugars can help    ??? Coronary artery disease    ??? Depression    ??? Diabetes mellitus (CMS-[name])    ??? Eye trauma 1998    glass in both eyes and removed   ??? GERD (gastroesophageal reflux disease)    ??? Gout    ??? Homeless    ??? Hyperlipidemia    ??? Hypertension    ??? Illiteracy and low-level literacy    ??? Microscopic hematuria    ??? Migraine    ??? Nephrolithiasis    ??? 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 Nonalcoholic fatty liver disease    ??? NSTEMI (non-ST elevated myocardial infarction) (CMS-[name]) 01/13/2017   ??? Obesity    ??? Obstructive sleep apnea 2009   ??? Panic attacks    ??? 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 Seizure-like activity (CMS-[name])     ~Monitor for symptoms and report them to your primary care provider if they occur    ??? Systolic CHF, chronic (CMS-[name])     EF 40-45% 2017   ??? TIA (transient ischemic attack)      Past Surgical History:   Procedure Laterality Date   ??? CARDIAC CATHETERIZATION  03/08/2007   ??? CORONARY STENT PLACEMENT     ??? CYSTOURETHROSCOPY  12/31/2009     Microscopic hematuria   ??? KNEE SURGERY Right 09/23/2006    arthroscopic knee surgery medial and lateral meniscus tears as well as crystal disease ??? PR CATH PLACE/CORON ANGIO, IMG SUPER/INTERP,R&L HRT CATH, L HRT VENTRIC N/A 02/13/2017    Procedure: Left/Right Heart Catheterization;  Surgeon: Marlaine Hind, MD;  Location: [facility] CATH;  Service: Cardiology   ??? PR CATH PLACE/CORON ANGIO,  IMG SUPER/INTERP,W LEFT HEART VENTRICULOGRAPHY N/A 03/17/2017    Procedure: Left Heart Catheterization W Intervention;  Surgeon: Marlaine Hind, MD;  Location: [facility] CATH;  Service: Cardiology   ??? PR CATH PLACE/CORON ANGIO, IMG SUPER/INTERP,W LEFT HEART VENTRICULOGRAPHY N/A 05/12/2018    Procedure: CATH LEFT HEART CATHETERIZATION W INTERVENTION;  Surgeon: Dorathy Kinsman, MD;  Location: [facility] CATH;  Service: Cardiology   ??? PR CATH PLACE/CORON ANGIO, IMG SUPER/INTERP,W LEFT HEART VENTRICULOGRAPHY N/A 10/22/2018    Procedure: CATH LEFT HEART CATHETERIZATION W INTERVENTION;  Surgeon: Marlaine Hind, MD;  Location: [facility] CATH;  Service: Cardiology   ??? PR ECMO/ECLS INITIATION VENO-ARTERIAL N/A 03/19/2017    Procedure: ECMO/ECLS; INITIATION, VENO-ARTERIAL;  Surgeon: Lennie Odor, MD;  Location: MAIN OR [facility];  Service: Cardiac Surgery   ??? PR ECMO/ECLS RMVL PRPH CANNULA OPEN 6 YRS & OLDER Left 03/25/2017    Procedure: ECMO / ECLS PROVIDED BY PHYSICIAN; REMOVAL OF PERIPHERAL (ARTERIAL AND/OR VENOUS) CANNULA(E), OPEN, 6 YEARS AND OLDER;  Surgeon: Alonna Buckler Ikonomidis, MD;  Location: MAIN OR [facility];  Service: Cardiac Surgery   ??? PR EPHYS EVAL W/ ABLATION SUPRAVENT ARRHYTHMIA N/A 07/19/2015    Catheter ablation of cavotricuspid isthmus for atrial flutter [facility])   ??? PR INSER HART PACER XVENOUS ATRIAL N/A 05/30/2015    [employer] dual-chamber pacemaker implant (E.Chung)   ??? PR INSERT/PLACE FLOW DIRECT CATH N/A 03/17/2017    Procedure: Insert Leave In [place];  Surgeon: Marlaine Hind, MD;  Location: [facility] CATH;  Service: Cardiology   ??? PR INSJ/RPLCMT PERM DFB W/TRNSVNS LDS 1/DUAL CHMBR N/A 05/04/2017 Procedure: ICD Implant System (Single/Dual);  Surgeon: Eldred Manges, MD;  Location: [facility] CATH;  Service: Cardiology   ??? PR NEGATIVE PRESSURE WOUND THERAPY DME >50 SQ CM Left 03/25/2017    Procedure: Neg Press Wound Tx (Vac Assist) Incl Topicals, Per Session, Tsa Greater Than/= 50 Cm Squared;  Surgeon: Alonna Buckler Ikonomidis, MD;  Location: MAIN OR [facility];  Service: Cardiac Surgery   ??? PR PRQ TRLUML CORONARY STENT W/ANGIO ONE ART/BRNCH N/A 03/12/2018    Procedure: Percutaneous Coronary Intervention;  Surgeon: Marlaine Hind, MD;  Location: [facility] CATH;  Service: Cardiology   ??? PR REBL VES DIRECT,LOW EXTREM Left 03/19/2017    Procedure: Repr Bld Vessel Direct; Lower Extrem;  Surgeon: Boykin Reaper, MD;  Location: MAIN OR [facility];  Service: Vascular   ??? PR REMV ART CLOT ILIAC-POP,LEG INCIS Left 03/25/2017    Procedure: EMBOLECTOMY OR THROMBECTOMY, WITH OR WITHOUT CATHETER; FEMOROPOPLITEAL, AORTOILIAC ARTERY, BY LEG INCISION;  Surgeon: Alonna Buckler Ikonomidis, MD;  Location: MAIN OR [facility];  Service: Cardiac Surgery   ??? PR UPPER GI ENDOSCOPY,BIOPSY N/A 02/28/2016    Procedure: UGI ENDOSCOPY; WITH BIOPSY, SINGLE OR MULTIPLE;  Surgeon: Liane Comber, MD;  Location: [facility];  Service: Gastroenterology   ??? PR UPPER GI ENDOSCOPY,DIAGNOSIS N/A 05/07/2016    Procedure: UGI ENDO, INCLUDE ESOPHAGUS, STOMACH, & DUODENUM &/OR JEJUNUM; DX W/WO COLLECTION SPECIMN, BY BRUSH OR WASH;  Surgeon: Modena Nunnery, MD;  Location: GI [facility]URES [facility];  Service: Gastroenterology       Current Outpatient Medications   Medication Sig Dispense Refill   ??? acetaminophen (TYLENOL) 500 MG tablet TAKE 2 TABLETS (1 000MG ) BY MOUTH EVERY 8 HOURS AS NEEDED FOR PAIN (Patient taking differently: Take 500 mg by mouth every four (4) hours as needed. ) 180 each 3   ??? albuterol HFA 90 mcg/actuation inhaler Inhale 2 puffs into the lungs every 6 (  six) hours as needed for wheezing or shortness of breath. 18 g 2 ??? alirocumab (PRALUENT) 150 mg/mL subcutaneous injection Inject 1 mL (150 mg total) under the skin every fourteen (14) days. 4 Syringe 11   ??? allopurinoL (ZYLOPRIM) 100 MG tablet Take 1.5 tablets (150 mg total) by mouth daily. 60 tablet 3   ??? amiodarone (PACERONE) 200 MG tablet TAKE 1 TABLET BY MOUTH ONCE DAILY 90 tablet 3   ??? amLODIPine (NORVASC) 10 MG tablet Take 1 tablet (10 mg total) by mouth daily. 90 tablet 3   ??? arm brace Misc 1 each by Miscellaneous route daily. 1 each 0   ??? atorvastatin (LIPITOR) 80 MG tablet Take 1 tablet (80 mg total) by mouth daily. 90 tablet 3   ??? blood sugar diagnostic Strp Disp to match current ins approved meter. Use to test blood sugar four times daily. E11.22, Z79.4 400 each 3   ??? blood-glucose meter kit Disp. blood glucose meter kit preferred by patient's insurance. Dx: Diabetes, E11.9 1 each 11   ??? bumetanide (BUMEX) 2 MG tablet Take 1 tablet (2 mg total) by mouth two (2) times a day. 180 tablet 3   ??? carvediloL (COREG) 25 MG tablet Take 1.5 tablets (37.5 mg total) by mouth Two (2) times a day. 270 tablet 3   ??? cholecalciferol, vitamin D3, 5,000 unit capsule Take 1 capsule (5,000 Units total) by mouth daily. 100 capsule 0   ??? cyclobenzaprine (FLEXERIL) 5 MG tablet Take 2 tablets (10 mg total) by mouth daily for 14 days. 28 tablet 0   ??? diclofenac sodium (VOLTAREN) 1 % gel Apply 2 grams topically Three (3) times a day. 100 g 11   ??? empty container Misc Use as directed to dispose of injectable medications 1 each 3   ??? ezetimibe (ZETIA) 10 mg tablet TAKE 1 TABLET BY MOUTH ONCE DAILY 90 each 3   ??? FLUoxetine (PROZAC) 40 MG capsule Take 2 capsules (80 mg total) by mouth daily. 180 capsule 3   ??? gabapentin (NEURONTIN) 300 MG capsule Take 1 capsule (300 mg total) by mouth two (2) times a day. 180 capsule 3   ??? insulin ASPART (NOVOLOG FLEXPEN U-100 INSULIN) 100 unit/mL (3 mL) injection pen Inject 0.2 mL (20 Units total) under the skin three (3) times a day with a meal. 15 mL 11 ??? insulin detemir U-100 (LEVEMIR) 100 unit/mL (3 mL) injection pen Inject 40 Units total under the skin nightly. 15 mL 3   ??? insulin syringe-needle U-100 0.3 mL 31 gauge x 5/16 Syrg Use to inject insulin twice daily with meals 60 each 0   ??? isosorbide mononitrate (IMDUR) 30 MG 24 hr tablet Take 4 tablets (120 mg total) by mouth daily. 120 tablet 3   ??? lancets Misc Disp. Ins preferred lancets. Use to test blood sugar four times daily. E11.22, Z79.4 400 each 3   ??? lidocaine (LIDODERM) 5 % patch Place 1 patch on the skin every twelve (12) hours for 14 days. Apply to affected area for 12 hours only each day (then remove patch) 28 patch 0   ??? loperamide (IMODIUM) 2 mg capsule Take 1 capsule (2 mg total) by mouth 4 (four) times a day as needed for diarrhea. 30 capsule 0   ??? nitroglycerin (NITROSTAT) 0.4 MG SL tablet Place 1 tablet (0.4 mg total) under the tongue every five (5) minutes as needed for chest pain. 100 each 3   ??? omeprazole (PRILOSEC) 40 MG capsule Take 1  capsule (40 mg total) by mouth Two (2) times a day (30 minutes before a meal). 180 capsule 3   ??? QUEtiapine (SEROQUEL) 50 MG tablet Take 50 mg by mouth. Takes prn     ??? rivaroxaban (XARELTO) 15 mg Tab Take 1 tablet (15 mg total) by mouth daily with evening meal. 90 tablet 3   ??? semaglutide 0.25 mg or 0.5 mg(2 mg/1.5 mL) PnIj Inject 0.5 mg under the skin every seven (7) days. 4.5 mL 3   ??? sevelamer (RENVELA) 800 mg tablet TAKE 2 TABLETS (1600MG ) BY MOUTH THREE TIMES DAILY WITH MEALS 540 tablet 3   ??? sodium polystyrene, SPS, with sorbitol (SPS, WITH SORBITOL,) 15-20 gram/60 mL Susp Take 60 mL (15 g) by mouth once a week. 473 mL 3   ??? UNIFINE PENTIPS 31 gauge x 5/16 Ndle USE WITH INSULIN AND VICTOZA 5 TIMES DAILY 400 each 3   ??? hydrALAZINE (APRESOLINE) 25 MG tablet Take 1 tablet (25 mg total) by mouth Three (3) times a day. 270 tablet 3 ??? ondansetron (ZOFRAN) 4 MG tablet Take 1 tablet (4 mg total) by mouth every eight (8) hours as needed for nausea for up to 4 days. (Patient not taking: Reported on 01/26/2019) 10 tablet 0   ??? ticagrelor (BRILINTA) 90 mg Tab TAKE 1 TABLET BY MOUTH TWICE DAILY (Patient not taking: Reported on 01/26/2019) 180 tablet 3     No current facility-administered medications for this visit.      Allergies   Allergen Reactions   ??? Losartan      Hyperkalemia (K>6)       Objective:       Physical Exam  BP 150/84 (BP Site: R Arm, BP Position: Sitting, BP Cuff Size: Large)  - Pulse 90  - Temp 36.7 ??C (98.1 ??F) (Temporal)  - Ht 190.5 cm (6' 3)  - Wt (!) 162.8 kg (358 lb 14.4 oz)  - SpO2 99%  - BMI 44.86 kg/m??             02/02/19 (!) 162.8 kg (358 lb 14.4 oz)   01/31/19 (!) 164.2 kg (362 lb)   01/26/19 (!) 160.6 kg (354 lb)   01/26/19 (!) 160.8 kg (354 lb 6.4 oz)   01/24/19 (!) 165.1 kg (364 lb)   01/20/19 (!) 164.2 kg (362 lb 1.6 oz)   01/19/19 (!) 167.5 kg (369 lb 3.2 oz)   01/05/19 (!) 156.1 kg (344 lb 1.6 oz)   12/16/18 (!) 151 kg (333 lb)   12/10/18 (!) 156.8 kg (345 lb 9.6 oz)   12/07/18 (!) 146.9 kg (323 lb 14.4 oz)   12/03/18 (!) 154.3 kg (340 lb 2.7 oz)   11/18/18 (!) 152.4 kg (336 lb)   11/17/18 (!) 152.6 kg (336 lb 6.4 oz)   10/22/18 (!) 153.3 kg (338 lb)   10/15/18 (!) 153.7 kg (338 lb 13.6 oz)   10/07/18 (!) 153.8 kg (339 lb)   09/29/18 (!) 155.7 kg (343 lb 3.2 oz)   09/21/18 (!) 154.4 kg (340 lb 6.2 oz)   08/12/18 (!) 154.4 kg (340 lb 4.8 oz)   07/22/18 (!) 154.5 kg (340 lb 11.2 oz)   07/02/18 (!) 151.2 kg (333 lb 4.8 oz)   06/22/18 (!) 154.7 kg (341 lb 0.8 oz)   05/31/18 (!) 153.2 kg (337 lb 11.9 oz)   05/31/18 (!) 152.2 kg (335 lb 8.6 oz)   05/28/18 (!) 151 kg (333 lb)   05/22/18 (!) 133.5 kg (294 lb  5 oz)   05/20/18 (!) 153 kg (337 lb 6.4 oz)   05/17/18 (!) 160.3 kg (353 lb 6.4 oz)   05/11/18 (!) 158.9 kg (350 lb 6.4 oz)     General:  Patient is chronically ill-appearing in no acute distress Eyes:  Intact, sclerae anicteric.   Ears, nose, mouth: Benign   Respiratory:   Normal respiratory effort. Clear to auscultation bilaterally.  There are no wheezes.   Cardiovascular:  Exam limited by thick next. JVP not seen above the clavicle with HOB at 60 degrees.  Rate and rhythm are regular.  There is no lifts or heaves.  Normal S1, S2. There is no murmur, gallops or rubs.  Radial and pedal pulses are 2+, bilaterally.   There is 1+ pitting pedal/pretibial edema, left greater than right.    Gastrointestinal:   Obese, Soft, non-tender, with audible bowel sounds. Abdomen nondistended.  Liver is nonpalpable.   Musculoskeletal: No joint swelling   Skin: Warm, well perfused.   Neurologic: Appropriate mood and affect. Alert and oriented to person, place, and time. No gross motor or sensory deficits evident.     Recent Labs:  Office Visit on 02/02/2019   Component Date Value Ref Range Status   ??? Sodium, POCT 02/02/2019 136  135 - 145 mmol/L Final   ??? Potassium, POCT 02/02/2019 4.8  3.5 - 5.0 mmol/L Final   ??? Chloride, POCT 02/02/2019 103  98 - 107 mmol/L Final   ??? CO2, POCT 02/02/2019 25  22 - 30 mmol/L Final   ??? BUN, POCT 02/02/2019 27* 7 - 21 mg/dL Final   ??? Creatinine, POCT 02/02/2019 4.4* 0.7 - 1.3 mg/dL Final   ??? Glucose, POCT 02/02/2019 128  70 - 179 mg/dL Final   ??? Calcium, POCT 02/02/2019 8.5  8.5 - 10.2 mg/dL Final       Lab Results   Component Value Date    PRO-BNP 3,670.0 (H) 01/26/2019    PRO-BNP 2,090.0 (H) 01/24/2019    PRO-BNP 2,550.0 (H) 01/20/2019    PRO-BNP 2,290.0 (H) 01/13/2019    PRO-BNP 3,440.0 (H) 09/21/2018    PRO-BNP 231 (H) 06/08/2014    Creatinine, POCT 4.4 (H) 02/02/2019    Creatinine, POCT 3.9 (H) 01/26/2019    Creatinine, POCT 3.6 (H) 01/24/2019    Creatinine 3.73 (H) 01/20/2019    Creatinine 3.43 (H) 01/13/2019    Creatinine 4.31 (H) 12/23/2018    Creatinine 3.44 (H) 12/04/2018    Creatinine 3.58 (H) 11/24/2018    Creatinine 1.30 06/09/2014    Creatinine 1.56 (H) 06/08/2014 Creatinine 1.32 (H) 10/17/2013    Creatinine 1.41 (H) 10/16/2013    Creatinine 1.36 (H) 07/25/2013    BUN 33 (H) 01/20/2019    BUN 28 (H) 01/13/2019    BUN 44 (H) 12/23/2018    BUN 33 (H) 12/04/2018    BUN 33 (H) 11/24/2018    BUN 24 (H) 06/09/2014    BUN 28 (H) 06/08/2014    BUN 16 10/17/2013    BUN 17 10/16/2013    BUN 16 07/25/2013    BUN, POCT 27 (H) 02/02/2019    BUN, POCT 27 (H) 01/26/2019    BUN, POCT 28 (H) 01/24/2019    Magnesium 1.6 01/26/2019    Magnesium 1.6 01/24/2019    Magnesium 1.7 01/20/2019    Magnesium 1.7 01/13/2019    Magnesium 1.8 12/04/2018    Magnesium 2.0 06/09/2014    Magnesium 1.7 05/12/2011    Magnesium 1.8 01/09/2011  Metric Tracker:  Did today's visit result in ED visit? No  Did today's visit result in hospital admission? No  Did today's visit result in referral to cardiology? n/a  Today's visit was a referral from [name]

## 2019-02-12 NOTE — Unmapped (Signed)
The Internal Medicine CCM team was unable to reach Jeremy Oliver to follow-up in regards to CCM. A voicemail was left with a request to return our call.

## 2019-02-14 NOTE — Unmapped (Signed)
I saw and evaluated the patient, participating in the key portions of the service.  I reviewed the resident???s note.  I agree with the resident???s findings and plan. Javana Schey A Izreal Kock, MD

## 2019-02-16 ENCOUNTER — Telehealth: Admit: 2019-02-16 | Discharge: 2019-02-17 | Payer: MEDICARE | Attending: Clinical | Primary: Clinical

## 2019-02-16 MED FILL — LEVEMIR FLEXTOUCH U-100 INSULIN 100 UNIT/ML (3 ML) SUBCUTANEOUS PEN: SUBCUTANEOUS | 37 days supply | Qty: 15 | Fill #1

## 2019-02-16 MED FILL — ALLOPURINOL 100 MG TABLET: 40 days supply | Qty: 60 | Fill #1 | Status: AC

## 2019-02-16 MED FILL — LEVEMIR FLEXTOUCH U-100 INSULIN 100 UNIT/ML (3 ML) SUBCUTANEOUS PEN: 37 days supply | Qty: 15 | Fill #1 | Status: AC

## 2019-02-16 MED FILL — SPS (WITH SORBITOL) 15 GRAM-20 GRAM/60 ML ORAL SUSPENSION: 60 days supply | Qty: 473 | Fill #0 | Status: AC

## 2019-02-16 MED FILL — DICLOFENAC 1 % TOPICAL GEL: TOPICAL | 17 days supply | Qty: 100 | Fill #10

## 2019-02-16 MED FILL — SPS (WITH SORBITOL) 15 GRAM-20 GRAM/60 ML ORAL SUSPENSION: ORAL | 60 days supply | Qty: 473 | Fill #0

## 2019-02-16 MED FILL — ALLOPURINOL 100 MG TABLET: ORAL | 40 days supply | Qty: 60 | Fill #1

## 2019-02-16 MED FILL — DICLOFENAC 1 % TOPICAL GEL: 17 days supply | Qty: 100 | Fill #10 | Status: AC

## 2019-02-16 NOTE — Unmapped (Signed)
INTERNAL MEDICINE COUNSELING SESSION NOTE    LENGTH OF SESSION:  Psychotherapy Session 45 minutes  ??  TYPE OF PSYCHOTHERAPY: Behavioral, Supportive  ??  REASON FOR TREATMENT: Problem Solving Treatment and Mind Body Stress Reduction skill training for??impact of health issues,??stress and self care management; link impact of effort to mood.  ??  ??  Patient participated in visit from his car  Patient indicated that:      they felt safe in their setting to engage in phone counseling visit.  ??  I spent 45 minutes on the real-time audio and video, had to complete by phone with the patient. I spent an additional 5 minutes on pre- and post-visit activities.     The patient was physically located in [place] or a state in which I am permitted to provide care. The patient and/or parent/guardian understood that s/he Bahner incur co-pays and cost sharing, and agreed to the telemedicine visit. The visit was reasonable and appropriate under the circumstances given the patient's presentation at the time.    The patient and/or parent/guardian has been advised of the potential risks and limitations of this mode of treatment (including, but not limited to, the absence of in-person examination) and has agreed to be treated using telemedicine. The patient's/patient's family's questions regarding telemedicine have been answered.     If the visit was completed in an ambulatory setting, the patient and/or parent/guardian has also been advised to contact their provider???s office for worsening conditions, and seek emergency medical treatment and/or call 911 if the patient deems either necessary.             REVIEW OF FUNCTIONING SINCE LAST VISIT    MOOD (0-10) =  OK    10  of 10 with 10 being the best. At last visit was   10    SATISFACTION WITH EFFORT IN CARING FOR MOOD (0-10) =    10 of 10 with 10 being the best.  At last visit was   8    PAIN:     3 - back to using voltaren gel  At last visit was    9 PHQ-9 SCORE:     1  At last visit was  2    GAD-7 SCORE:     3  At last visit was    3      CURRENT SUICIDAL/HOMICIDAL IDEATION:   Denies SI / HI      PROBLEM LIST:    Patient Active Problem List   Diagnosis   ??? Idiopathic chronic gout of left knee   ??? Hypercholesteremia   ??? Essential hypertension (RAF-[name])   ??? 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 Osteoarthritis   ??? Chronic nonalcoholic liver disease   ??? GERD (gastroesophageal reflux disease)   ??? Mild intermittent asthma without complication   ??? Chronic pain syndrome   ??? Paroxysmal atrial fibrillation (CMS-[name])   ??? OSA (obstructive sleep apnea)   ??? Tobacco use disorder   ??? Status post cardiac pacemaker procedure   ??? Diabetes mellitus with gastroparesis (CMS-[name])   ??? Cardiac pacemaker in situ   ??? Recurrent major depressive disorder, in full remission (CMS-[name])   ??? NSTEMI (non-ST elevated myocardial infarction) (CMS-[name])   ??? Adjustment disorder, unspecified   ??? Hyperkalemia   ??? CKD (chronic kidney disease) stage 4, GFR 15-29 ml/min (CMS-[name])   ??? Acquired left foot drop   ???  Decreased sensation of lower extremity, left   ??? Chronic anticoagulation   ??? Type 2 diabetes mellitus with stage 4 chronic kidney disease, with long-term current use of insulin (CMS-[name])   ??? Situational stress   ??? Ulcer of left foot with fat layer exposed (CMS-[name])         MEDICATION COMPLIANCE:     Taking all medications regularly?  yes     Any changes in medication since last visit?  no     Any missed doses? no       Current Outpatient Medications on File Prior to Visit   Medication Sig Dispense Refill   ??? acetaminophen (TYLENOL) 500 MG tablet TAKE 2 TABLETS (1 000MG ) BY MOUTH EVERY 8 HOURS AS NEEDED FOR PAIN (Patient taking differently: Take 500 mg by mouth every four (4) hours as needed. ) 180 each 3 ??? albuterol HFA 90 mcg/actuation inhaler Inhale 2 puffs into the lungs every 6 (six) hours as needed for wheezing or shortness of breath. 18 g 2   ??? alirocumab (PRALUENT) 150 mg/mL subcutaneous injection Inject 1 mL (150 mg total) under the skin every fourteen (14) days. 4 Syringe 11   ??? allopurinoL (ZYLOPRIM) 100 MG tablet Take 1.5 tablets (150 mg total) by mouth daily. 60 tablet 3   ??? amiodarone (PACERONE) 200 MG tablet TAKE 1 TABLET BY MOUTH ONCE DAILY 90 tablet 3   ??? amLODIPine (NORVASC) 10 MG tablet Take 1 tablet (10 mg total) by mouth daily. 90 tablet 3   ??? arm brace Misc 1 each by Miscellaneous route daily. 1 each 0   ??? atorvastatin (LIPITOR) 80 MG tablet Take 1 tablet (80 mg total) by mouth daily. 90 tablet 3   ??? blood sugar diagnostic Strp Disp to match current ins approved meter. Use to test blood sugar four times daily. E11.22, Z79.4 400 each 3   ??? blood-glucose meter kit Disp. blood glucose meter kit preferred by patient's insurance. Dx: Diabetes, E11.9 1 each 11   ??? bumetanide (BUMEX) 2 MG tablet Take 1 tablet (2 mg total) by mouth two (2) times a day. 180 tablet 3   ??? carvediloL (COREG) 25 MG tablet Take 1.5 tablets (37.5 mg total) by mouth Two (2) times a day. 270 tablet 3   ??? [EXPIRED] cephalexin (KEFLEX) 500 MG capsule Take 1 capsule (500 mg total) by mouth Three (3) times a day for 14 days. 42 capsule 0   ??? cholecalciferol, vitamin D3, 5,000 unit capsule Take 1 capsule (5,000 Units total) by mouth daily. 100 capsule 0   ??? [EXPIRED] cyclobenzaprine (FLEXERIL) 5 MG tablet Take 2 tablets (10 mg total) by mouth daily for 14 days. 28 tablet 0   ??? diclofenac sodium (VOLTAREN) 1 % gel Apply 2 grams topically Three (3) times a day. 100 g 11   ??? [EXPIRED] doxycycline (VIBRA-TABS) 100 MG tablet Take 1 tablet (100 mg total) by mouth Two (2) times a day for 14 days. 28 tablet 0   ??? empty container Misc Use as directed to dispose of injectable medications 1 each 3 ??? ezetimibe (ZETIA) 10 mg tablet TAKE 1 TABLET BY MOUTH ONCE DAILY 90 each 3   ??? FLUoxetine (PROZAC) 40 MG capsule Take 2 capsules (80 mg total) by mouth daily. 180 capsule 3   ??? gabapentin (NEURONTIN) 300 MG capsule Take 1 capsule (300 mg total) by mouth two (2) times a day. 180 capsule 3   ??? hydrALAZINE (APRESOLINE) 25 MG tablet Take 1 tablet (  25 mg total) by mouth Three (3) times a day. 270 tablet 3   ??? insulin ASPART (NOVOLOG FLEXPEN U-100 INSULIN) 100 unit/mL (3 mL) injection pen Inject 0.2 mL (20 Units total) under the skin three (3) times a day with a meal. 15 mL 11   ??? insulin detemir U-100 (LEVEMIR) 100 unit/mL (3 mL) injection pen Inject 40 Units total under the skin nightly. 15 mL 3   ??? insulin syringe-needle U-100 0.3 mL 31 gauge x 5/16 Syrg Use to inject insulin twice daily with meals 60 each 0   ??? isosorbide mononitrate (IMDUR) 30 MG 24 hr tablet Take 4 tablets (120 mg total) by mouth daily. 120 tablet 3   ??? lancets Misc Disp. Ins preferred lancets. Use to test blood sugar four times daily. E11.22, Z79.4 400 each 3   ??? lidocaine (LIDODERM) 5 % patch Place 1 patch on the skin every twelve (12) hours for 14 days. Apply to affected area for 12 hours only each day (then remove patch) 28 patch 0   ??? loperamide (IMODIUM) 2 mg capsule Take 1 capsule (2 mg total) by mouth 4 (four) times a day as needed for diarrhea. 30 capsule 0   ??? [EXPIRED] metroNIDAZOLE (FLAGYL) 500 MG tablet Take 1 tablet (500 mg total) by mouth Three (3) times a day for 14 days. (Patient not taking: Reported on 01/20/2019) 42 tablet 0   ??? nitroglycerin (NITROSTAT) 0.4 MG SL tablet Place 1 tablet (0.4 mg total) under the tongue every five (5) minutes as needed for chest pain. 100 each 3   ??? omeprazole (PRILOSEC) 40 MG capsule Take 1 capsule (40 mg total) by mouth Two (2) times a day (30 minutes before a meal). 180 capsule 3 ??? ondansetron (ZOFRAN) 4 MG tablet Take 1 tablet (4 mg total) by mouth every eight (8) hours as needed for nausea for up to 4 days. (Patient not taking: Reported on 01/26/2019) 10 tablet 0   ??? QUEtiapine (SEROQUEL) 50 MG tablet Take 50 mg by mouth. Takes prn     ??? rivaroxaban (XARELTO) 15 mg Tab Take 1 tablet (15 mg total) by mouth daily with evening meal. 90 tablet 3   ??? semaglutide 0.25 mg or 0.5 mg(2 mg/1.5 mL) PnIj Inject 0.5 mg under the skin every seven (7) days. 4.5 mL 3   ??? sevelamer (RENVELA) 800 mg tablet TAKE 2 TABLETS (1600MG ) BY MOUTH THREE TIMES DAILY WITH MEALS 540 tablet 3   ??? sodium polystyrene, SPS, with sorbitol (SPS, WITH SORBITOL,) 15-20 gram/60 mL Susp Take 60 mL (15 g) by mouth once a week. 473 mL 3   ??? ticagrelor (BRILINTA) 90 mg Tab TAKE 1 TABLET BY MOUTH TWICE DAILY (Patient not taking: Reported on 01/26/2019) 180 tablet 3   ??? UNIFINE PENTIPS 31 gauge x 5/16 Ndle USE WITH INSULIN AND VICTOZA 5 TIMES DAILY 400 each 3   ??? [DISCONTINUED] liraglutide (VICTOZA 2-PAK) 0.6 mg/0.1 mL (18 mg/3 mL) injection Inject 0.1 mL (0.6 mg total) under the skin daily. 3 mL 1   ??? [DISCONTINUED] ranolazine (RANEXA) 500 MG 12 hr tablet Take by mouth two (2) times a day. 60 tablet 0   ??? [DISCONTINUED] traZODone (DESYREL) 50 MG tablet Take 1 tablet (50 mg total) by mouth nightly. 90 tablet 3     No current facility-administered medications on file prior to visit.            SLEEPING:    pretty good  APPETITE:    Portion my meals and watching what I eat, drinking Zero Gatorade          SUBSTANCE USE:  no         SESSION FOCUS:      Today's Problem Solving addressed:    1. Situational stress      No longer homeless  Continued financial challenges with period of homelessness and medical bills/issues  Living in nephew's outbuilding - cold for wife at night, temperature was comfortable for him. I was sweating.  He's working on getting more heating for the building. Got a larger heater from storage today. Plan to continue to work with nephew to put in insulation and a drop ceiling because there is a tin roof  Has a working Surveyor, mining area  Have hot water and are able to do a sponge Metallurgist doctors told wife that they can't do anything further medically.  Settlement is planned by [employer]    Restless during the daytime  Wanting to get outside and do things  Pushing myself - went to do some hunting, not going alone  Walking 1/4 of a mile to hunting spot. Some SOB, says some chest tightness, but no chest tightness  3 years since he hunted last. Got one buck.   Of effort, Tiring and it felt good  Shared meat with nephew and got a good amount of meat for both of them  Only has his shotgun - lost all the guns he pawned and having to work with owner of storage facility to get back other belongings.    Thanksgiving - trying to decide if he's [place] and grandson    Today's session is 11 of 12 visits.   He says overall he's doing well and would like to continue.  I reminded him of continuity support vs short term behavioral. He prefers to take time off and ask for a referral back some time in 2021 as helpful.    He asked if I would see his wife for counseling.  (Note this clinician recently clinically reviewed a referral made for his wife and indications were for higher level continuity counseling and psychiatry, specialized services)  I let him know based on what she's been experiencing, that has been presented by her when contacting him for his appointments, I'd recommend supportive continuity counseling rather than behavioral and that I'm glad to provide a referral. He declined this.  It sounded like his wife had returned from shopping when he started to ask about services for her.       Discussed issues presented and options for continued and/or improved self care management I continued to recommend he make efforts to pace activity for his health and well-being.      Additional:     Medication adherence and barriers to the treatment plan have been addressed. Opportunities to optimize healthy behaviors have been discussed. Patient / caregiver voiced understanding.          ASSESSMENT      ENGAGEMENT: Patient presented a willingness to participate in treatment.  PARTICIPATION QUALITY:    Active  MOOD:  OK     AFFECT:  REACTIVE   MENTAL STATUS:     alert and oriented  MODES OF INTERVENTION used in this session:    Clarification, Exploration, Problem Solving, Support, Reframing or Week review      TREATMENT PLAN  Mr. Petropoulos completed 12 of 12 visits.  He was interested in continuing counseling.  He is aware that this is a short term behavioral counseling program.  He declined a referral for continuity supportive counseling.  He prefers to take time off and ask for a referral back some time in 2021 as helpful

## 2019-03-04 DIAGNOSIS — I10 Essential (primary) hypertension: Principal | ICD-10-CM

## 2019-03-04 DIAGNOSIS — I480 Paroxysmal atrial fibrillation: Principal | ICD-10-CM

## 2019-03-04 DIAGNOSIS — E1143 Type 2 diabetes mellitus with diabetic autonomic (poly)neuropathy: Principal | ICD-10-CM

## 2019-03-04 DIAGNOSIS — M150 Primary generalized (osteo)arthritis: Principal | ICD-10-CM

## 2019-03-04 DIAGNOSIS — I2510 Atherosclerotic heart disease of native coronary artery without angina pectoris: Principal | ICD-10-CM

## 2019-03-04 DIAGNOSIS — E1122 Type 2 diabetes mellitus with diabetic chronic kidney disease: Principal | ICD-10-CM

## 2019-03-04 DIAGNOSIS — N184 Chronic kidney disease, stage 4 (severe): Principal | ICD-10-CM

## 2019-03-04 DIAGNOSIS — Z794 Long term (current) use of insulin: Principal | ICD-10-CM

## 2019-03-04 MED ORDER — EZETIMIBE 10 MG TABLET
ORAL | 3 refills | 0 days | Status: CP
Start: 2019-03-04 — End: 2020-03-03
  Filled 2019-04-07: qty 90, 90d supply, fill #0

## 2019-03-04 MED ORDER — UNIFINE PENTIPS 31 GAUGE X 5/16" NEEDLE (8 MM)
3 refills | 0 days | Status: CP
Start: 2019-03-04 — End: 2020-03-03
  Filled 2019-04-07: qty 400, 80d supply, fill #0

## 2019-03-04 NOTE — Unmapped (Signed)
CCM intake    PCP Action:please send gabapentin 300mg  po tid to [facility] if appropriate. - recommended by Dr. Pricilla Riffle 11/2    CCM Action:f/u gabapentin. pt to r/s missed cardiology appt.     Patient Action:patricia rescheduling cardiology appt missed this week d/t shopping .      Follow up/Discuss next call: HF, DM< GI/emesis?    CCM intake. [name] calling w/ refill requests, insulin pin, zetia and neurontin increase?  CCM follow up. Per note:  Dr. Pricilla Riffle:   01/31/19 lidocaine patches to affected area once a day for 2 weeks  Dr. Pricilla Riffle:     - increase gabapentin from 300 mg twice daily to 300 mg 3 times daily  -trigger point injection in the piriformis would be beneficial but due to him being on anticoagulation we cannot do the trigger point injection today, will message his primary care provider, Dr. Hulan Fess, for further planning of possible trigger point injection.      Foot diabetic ulcers healed.  Left foot- 3 new blisters, has Podiatry appt next week. Pt afebrile.  Wife states old feet blistering has healed.  Denies need to be seen prior to 03/14/19 appt.  Pt and wife are no longer homeless. Pay $150/month to live in a family home for now.  [name] states pt still has diarrhea and emesis  throws up atleast maybe at least once every day  Pt remains at baseline. No ED visits.    [name] states sometimes pt has CP when walking around but goes back down at rest.  Discussed ER for acute CP, [name] verbalizes understanding.    States FBG this morning 98 (takes ozempic on "Sundays, and pt thinks it is what makes him nauseated) pt and wife do not want him to change ozempic bc state, it's working.    [name] states BP @ rest 11/80, <100                          BP up and around                                          18" 6/102,115  Unsure when last took BPs.  Pt and wife speaking to daughter, Clarisse Gouge again.   Pt taking Levemir s/c daily.  ozempic s/c weekly and novolog s/c tid CCM spoke w/ pharmacist, Megan @ [facility]. Pen tips and zetia ordered per protocol. Will follow up w/ providers re: gabapentin 300mg  po tid (increased dose)

## 2019-03-07 MED FILL — BRILINTA 90 MG TABLET: 90 days supply | Qty: 180 | Fill #0 | Status: AC

## 2019-03-07 MED FILL — FLUOXETINE 40 MG CAPSULE: 90 days supply | Qty: 180 | Fill #0 | Status: AC

## 2019-03-07 MED FILL — ATORVASTATIN 80 MG TABLET: 90 days supply | Qty: 90 | Fill #0 | Status: AC

## 2019-03-07 MED FILL — NOVOLOG FLEXPEN U-100 INSULIN ASPART 100 UNIT/ML (3 ML) SUBCUTANEOUS: 25 days supply | Qty: 15 | Fill #2 | Status: AC

## 2019-03-07 MED FILL — DICLOFENAC 1 % TOPICAL GEL: 17 days supply | Qty: 100 | Fill #11 | Status: AC

## 2019-03-07 MED FILL — NOVOLOG FLEXPEN U-100 INSULIN ASPART 100 UNIT/ML (3 ML) SUBCUTANEOUS: SUBCUTANEOUS | 25 days supply | Qty: 15 | Fill #2

## 2019-03-07 MED FILL — ACETAMINOPHEN 500 MG TABLET: 30 days supply | Qty: 180 | Fill #3 | Status: AC

## 2019-03-07 MED FILL — XARELTO 15 MG TABLET: 90 days supply | Qty: 90 | Fill #0 | Status: AC

## 2019-03-07 MED FILL — VENTOLIN HFA 90 MCG/ACTUATION AEROSOL INHALER: 25 days supply | Qty: 18 | Fill #2 | Status: AC

## 2019-03-07 MED FILL — NITROGLYCERIN 0.4 MG SUBLINGUAL TABLET: SUBLINGUAL | 20 days supply | Qty: 100 | Fill #2

## 2019-03-07 MED FILL — VENTOLIN HFA 90 MCG/ACTUATION AEROSOL INHALER: RESPIRATORY_TRACT | 25 days supply | Qty: 18 | Fill #2

## 2019-03-07 MED FILL — ISOSORBIDE MONONITRATE ER 30 MG TABLET,EXTENDED RELEASE 24 HR: 30 days supply | Qty: 120 | Fill #1 | Status: AC

## 2019-03-07 MED FILL — ISOSORBIDE MONONITRATE ER 30 MG TABLET,EXTENDED RELEASE 24 HR: ORAL | 30 days supply | Qty: 120 | Fill #1

## 2019-03-07 MED FILL — NITROGLYCERIN 0.4 MG SUBLINGUAL TABLET: 20 days supply | Qty: 100 | Fill #2 | Status: AC

## 2019-03-07 MED FILL — AMIODARONE 200 MG TABLET: 90 days supply | Qty: 90 | Fill #0 | Status: AC

## 2019-03-07 MED FILL — ACETAMINOPHEN 500 MG TABLET: 30 days supply | Qty: 180 | Fill #3

## 2019-03-07 MED FILL — DICLOFENAC 1 % TOPICAL GEL: TOPICAL | 17 days supply | Qty: 100 | Fill #11

## 2019-03-07 NOTE — Unmapped (Signed)
[place] [place] Assessment of Medications Program (CAMP)                        RECRUITMENT SUMMARY NOTE     The following patient was outreached for CAMP services. Letter sent          Romaldo Saville [facility]  Clinical Operations Specialist/CPhT  [facility] of [facility]ication [facility] (CAMP)  [phone]

## 2019-03-08 NOTE — Unmapped (Signed)
I was the supervising physician in the delivery of the service. Xylina Rhoads D Avannah Decker, MD

## 2019-03-09 DIAGNOSIS — I483 Typical atrial flutter: Principal | ICD-10-CM

## 2019-03-09 DIAGNOSIS — M150 Primary generalized (osteo)arthritis: Principal | ICD-10-CM

## 2019-03-09 DIAGNOSIS — I5042 Chronic combined systolic (congestive) and diastolic (congestive) heart failure: Principal | ICD-10-CM

## 2019-03-10 NOTE — Unmapped (Signed)
I was the supervising physician in the delivery of the service. Ethlyn Alto D Iqra Rotundo, MD

## 2019-03-10 NOTE — Unmapped (Signed)
CCM Intake by pt's wife.     PCP Action:None     [name] requesting appt times for pt.  [name] says pt has pains now and then, goes away after resting. Denies pt need to be seen. NAD  Noted.  Call completed by [name]. David with no current needs. [name] calling to request ax for her s/s of a UTI. (sent request/triage to [name]'s PcP)

## 2019-03-14 MED FILL — LEVEMIR FLEXTOUCH U-100 INSULIN 100 UNIT/ML (3 ML) SUBCUTANEOUS PEN: SUBCUTANEOUS | 37 days supply | Qty: 15 | Fill #2

## 2019-03-14 MED FILL — LEVEMIR FLEXTOUCH U-100 INSULIN 100 UNIT/ML (3 ML) SUBCUTANEOUS PEN: 37 days supply | Qty: 15 | Fill #2 | Status: AC

## 2019-03-15 NOTE — Unmapped (Deleted)
[facility] - [facility]  Established Visit    Reason for Visit:  ***    1. Ulcer of left foot with fat layer exposed (CMS-[name])    2. Essential hypertension (RAF-[name])    3. Type 2 diabetes mellitus with stage 4 chronic kidney disease, with long-term current use of insulin (CMS-[name])        Assessment/Plans:      No follow-ups on file.     Medication adherence and barriers to the treatment plan have been addressed. Opportunities to optimize healthy behaviors have been discussed. Patient / caregiver voiced understanding.        __________________________________________________________    HPI:        ROS:  {Ros - complete:30496}    Patient Active Problem List   Diagnosis   ??? Idiopathic chronic gout of left knee   ??? Hypercholesteremia   ??? Essential hypertension (RAF-[name])   ??? 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 Osteoarthritis   ??? Chronic nonalcoholic liver disease   ??? GERD (gastroesophageal reflux disease)   ??? Mild intermittent asthma without complication   ??? Chronic pain syndrome   ??? Paroxysmal atrial fibrillation (CMS-[name])   ??? OSA (obstructive sleep apnea)   ??? Tobacco use disorder   ??? Status post cardiac pacemaker procedure   ??? Diabetes mellitus with gastroparesis (CMS-[name])   ??? Cardiac pacemaker in situ   ??? Recurrent major depressive disorder, in full remission (CMS-[name])   ??? NSTEMI (non-ST elevated myocardial infarction) (CMS-[name])   ??? Adjustment disorder, unspecified   ??? Hyperkalemia   ??? CKD (chronic kidney disease) stage 4, GFR 15-29 ml/min (CMS-[name])   ??? Acquired left foot drop   ??? Decreased sensation of lower extremity, left   ??? Chronic anticoagulation   ??? Type 2 diabetes mellitus with stage 4 chronic kidney disease, with long-term current use of insulin (CMS-[name])   ??? Situational stress   ??? Ulcer of left foot with fat layer exposed (CMS-[name])       Medications:  Reviewed in EPIC      Physical Exam: Vital Signs:   There were no vitals taken for this visit.  Gen: Well appearing, NAD  CV: RRR, no murmurs  Pulm: CTA bilaterally, no crackles or wheezes  GI: Soft, NTND, normal BS. No HSM.  MSK: No edema

## 2019-03-21 NOTE — Unmapped (Signed)
[place] Assessment of Medications Program (CAMP)                        RECRUITMENT SUMMARY NOTE     The following patient was outreached for CAMP services. No answer-left message.          Jeremy Oliver  Clinical Operations Specialist/CPhT  [place] Assessment of Medications Program (CAMP)  p [phone]  -  f [phone]

## 2019-03-23 NOTE — Unmapped (Signed)
[place] Assessment of Medications Program (CAMP)                        RECRUITMENT SUMMARY NOTE     The following patient was outreached for CAMP services. Patient declined/withdrawn    Patient declined CAMP services.  Declined reason: Patient choice (no specific reason given)     Benna Dunks [place]  Clinical Operations Specialist/CPhT  [employer] of Medication [facility] (CAMP)  (P) [phone]

## 2019-03-23 NOTE — Unmapped (Signed)
[facility] Refill Coordination Note    Specialty Medication(s) to be Shipped:   General Specialty: Praluent    Other medication(s) to be shipped:   sharps     Jeremy Oliver, DOB: 1970-09-19  Phone: [phone] (home)       All above HIPAA information was verified with patient's family member, Jeremy Oliver -wife.     Was a Nurse, learning disability used for this call? No    Completed refill call assessment today to schedule patient's medication shipment from the [facility] [phone]).       Specialty medication(s) and dose(s) confirmed: Regimen is correct and unchanged.   Changes to medications: Jeremy Oliver reports no changes at this time.  Changes to insurance: No  Questions for the pharmacist: No    Confirmed patient received Welcome Packet with first shipment. The patient will receive a drug information handout for each medication shipped and additional FDA Medication Guides as required.       DISEASE/MEDICATION-SPECIFIC INFORMATION        For patients on injectable medications: Patient currently has 0 doses left.  Next injection is scheduled for 01/02.    SPECIALTY MEDICATION ADHERENCE     Medication Adherence    Patient reported X missed doses in the last month: 0  Specialty Medication: praluent 150mg /ml  Patient is on additional specialty medications: No  Informant: spouse                praluent 150 mg/ml: 14 days of medicine on hand         SHIPPING     Shipping address confirmed in Epic.     Delivery Scheduled: Yes, Expected medication delivery date: 01/06.     Medication will be delivered via Same Day Courier to the prescription address in Epic WAM.    Jeremy Oliver   [facility] Specialty Technician

## 2019-03-28 DIAGNOSIS — I10 Essential (primary) hypertension: Principal | ICD-10-CM

## 2019-03-28 DIAGNOSIS — Z794 Long term (current) use of insulin: Principal | ICD-10-CM

## 2019-03-28 DIAGNOSIS — E1122 Type 2 diabetes mellitus with diabetic chronic kidney disease: Principal | ICD-10-CM

## 2019-03-28 DIAGNOSIS — N184 Chronic kidney disease, stage 4 (severe): Principal | ICD-10-CM

## 2019-03-29 MED ORDER — ACETAMINOPHEN 500 MG TABLET
3 refills | 0 days | Status: CP
Start: 2019-03-29 — End: 2020-03-28
  Filled 2019-04-07: qty 180, 30d supply, fill #0

## 2019-03-29 MED ORDER — ALBUTEROL SULFATE HFA 90 MCG/ACTUATION AEROSOL INHALER
RESPIRATORY_TRACT | 2 refills | 0 days | Status: CP
Start: 2019-03-29 — End: ?
  Filled 2019-04-07: qty 18, 25d supply, fill #0

## 2019-03-30 DIAGNOSIS — E1122 Type 2 diabetes mellitus with diabetic chronic kidney disease: Principal | ICD-10-CM

## 2019-03-30 DIAGNOSIS — E7800 Pure hypercholesterolemia, unspecified: Principal | ICD-10-CM

## 2019-03-30 DIAGNOSIS — G894 Chronic pain syndrome: Principal | ICD-10-CM

## 2019-03-30 DIAGNOSIS — N184 Chronic kidney disease, stage 4 (severe): Principal | ICD-10-CM

## 2019-03-30 DIAGNOSIS — Z794 Long term (current) use of insulin: Principal | ICD-10-CM

## 2019-03-30 DIAGNOSIS — I2510 Atherosclerotic heart disease of native coronary artery without angina pectoris: Principal | ICD-10-CM

## 2019-03-30 NOTE — Unmapped (Addendum)
Addendum:  Phone appt w/ Provider scheduled today.    Pt living in building that Nephew, and dog.  Pt requestsTessalon pearls rx - 3/10 dry cough. [facility].    Pt refuses to get covid tested, states, I've been tested  3 times and it's always negative, I know this isn't covid. Currently pt is located 1 hour from [facility]    Pt states CP comes and goes w/ coughing (states nothing new).   Pt states emesis when eats is ongoing (states nothing new)  Afebrile. NAD noted.  Hx PE, DVT taking ticagrelor, rivaroxaban  Will notify provider     Pt states took inhaler ventolin yesterday and it did help. Has not taken ventolin today , advised by CCM to take it if it helps. Pt states has had some wheezing, not wheezing while speaking on the phone.     Reason for Disposition  ??? Wheezing is present    Answer Assessment - Initial Assessment Questions  1. ONSET: When did the cough begin?       Cough started yesterday  2. SEVERITY: How bad is the cough today?       3/10  3. RESPIRATORY DISTRESS: Describe your breathing.       Breathing ok   4. FEVER: Do you have a fever? If so, ask: What is your temperature, how was it measured, and when did it start?      No fever   5. HEMOPTYSIS: Are you coughing up any blood? If so ask: How much? (flecks, streaks, tablespoons, etc.)      No blood   6. TREATMENT: What have you done so far to treat the cough? (e.g., meds, fluids, humidifier)      Pt has not done anything except inhaler.  7. CARDIAC HISTORY: Do you have any history of heart disease? (e.g., heart attack, congestive heart failure)       Yes   8. LUNG HISTORY: Do you have any history of lung disease?  (e.g., pulmonary embolus, asthma, emphysema)      Asthma   9. PE RISK FACTORS: Do you have a history of blood clots? (or: recent major surgery, recent prolonged travel, bedridden)      Yes, on blood thinners,   10. OTHER SYMPTOMS: Do you have any other symptoms? (e.g., runny nose, wheezing, chest pain) No runny nose, yes wheezing, chest pain, transient feels it comes and goes with coughing   11. PREGNANCY: NA        12. TRAVEL: Have you traveled out of the country in the last month? (e.g., travel history, exposures)  No    Protocols used: COUGH-ADULT-[facility]

## 2019-03-30 NOTE — Unmapped (Deleted)
Internal Medicine Phone Visit    This visit is conducted via telephone.    Contact Information  Person Contacted: {vcrphonecontact:[id number]}  Contact Phone number: [phone] (home)   Is there someone else in the room? {televisitvisitor:[id number]}  Patient agreed to a phone visit    Jeremy Oliver is a 48 y.o. male  participating in a telephone encounter. ??  Pt states CP comes and goes w/ coughing (states nothing new).   Pt states emesis when eats is ongoing (states nothing new)  Afebrile. NAD noted.  Hx PE, DVT taking ticagrelor, rivaroxaban    Reason for Call  Patient called in and spoke with triage nurse today. Requested Tessalon pearls for dry cough. Per nurse note, pt refuses to get covid tested, states, I've been tested  3 times and it's always negative, I know this isn't covid. Currently pt is located 1 hour from [facility]. Pt states CP comes and goes w/ coughing (states nothing new). Pt reports emesis when he eats which is ongoing issue (states nothing new). No fever. Has a history of PE, DVT taking ticagrelor, rivaroxaban      Subjective:    Dry, non-bloody cough started yesterday. 3/10. Breathing is OK. No fever. Cough associated with wheezing. Has some transient chest pain- comes and goes with the coughing.  Relevant history of history of asthma, HTN, Diabetes, A. Fib, Heart failure, GERD. Has tried using his inhaler.     I have reviewed the problem list, medications, and allergies and have updated/reconciled them if needed.    Objective:  N/A    Assessment & Plan:  No diagnosis found.    No follow-ups on file.     Staffed with: ***    I spent *** minutes on the {phone audio video visit:[id number]} with the patient. I spent an additional *** minutes on pre- and post-visit activities.     The patient was physically located in [place] or a state in which I am permitted to provide care. The patient and/or parent/guardian understood that s/he Maselli incur co-pays and cost sharing, and agreed to the telemedicine visit. The visit was reasonable and appropriate under the circumstances given the patient's presentation at the time.    The patient and/or parent/guardian has been advised of the potential risks and limitations of this mode of treatment (including, but not limited to, the absence of in-person examination) and has agreed to be treated using telemedicine. The patient's/patient's family's questions regarding telemedicine have been answered.     If the visit was completed in an ambulatory setting, the patient and/or parent/guardian has also been advised to contact their provider???s office for worsening conditions, and seek emergency medical treatment and/or call 911 if the patient deems either necessary.

## 2019-03-30 NOTE — Unmapped (Deleted)
Pre visit incomplete. Attempted to contact patient. Unable to reach patient.    1st attempt: Left voicemail 1440    2nd attempt:  1445     Final attempt: 1451

## 2019-03-30 NOTE — Unmapped (Signed)
Called to schedule pt for prior lab draw. Pt was not available. Left voice message on machine.

## 2019-03-30 NOTE — Unmapped (Signed)
He should be scheduled for a visit to make sure all he needs is tessalon perles

## 2019-04-01 ENCOUNTER — Emergency Department: Admit: 2019-04-01 | Discharge: 2019-04-01 | Disposition: A | Discharge status: Home / Self Care | Payer: MEDICARE

## 2019-04-01 ENCOUNTER — Ambulatory Visit: Admit: 2019-04-01 | Discharge: 2019-04-01 | Disposition: A | Discharge status: Home / Self Care | Payer: MEDICARE

## 2019-04-01 DIAGNOSIS — R9431 Abnormal electrocardiogram [ECG] [EKG]: Principal | ICD-10-CM

## 2019-04-01 DIAGNOSIS — N184 Chronic kidney disease, stage 4 (severe): Principal | ICD-10-CM

## 2019-04-01 DIAGNOSIS — R079 Chest pain, unspecified: Principal | ICD-10-CM

## 2019-04-01 LAB — COMPREHENSIVE METABOLIC PANEL
ALBUMIN: 3 g/dL — ABNORMAL LOW (ref 3.5–5.0)
ALKALINE PHOSPHATASE: 89 U/L (ref 38–126)
ALT (SGPT): 11 U/L (ref <50)
ANION GAP: 10 mmol/L (ref 7–15)
AST (SGOT): 12 U/L — ABNORMAL LOW (ref 19–55)
BILIRUBIN TOTAL: 0.3 mg/dL (ref 0.0–1.2)
BLOOD UREA NITROGEN: 32 mg/dL — ABNORMAL HIGH (ref 7–21)
BUN / CREAT RATIO: 7
CALCIUM: 7.6 mg/dL — ABNORMAL LOW (ref 8.5–10.2)
CHLORIDE: 109 mmol/L — ABNORMAL HIGH (ref 98–107)
CO2: 19 mmol/L — ABNORMAL LOW (ref 22.0–30.0)
CREATININE: 4.92 mg/dL — ABNORMAL HIGH (ref 0.70–1.30)
EGFR CKD-EPI AA MALE: 15 mL/min/{1.73_m2} — ABNORMAL LOW (ref >=60)
EGFR CKD-EPI NON-AA MALE: 13 mL/min/{1.73_m2} — ABNORMAL LOW (ref >=60)
GLUCOSE RANDOM: 202 mg/dL — ABNORMAL HIGH (ref 70–179)
POTASSIUM: 4.8 mmol/L (ref 3.5–5.0)
SODIUM: 138 mmol/L (ref 135–145)

## 2019-04-01 LAB — TROPONIN I
Troponin I.cardiac:MCnc:Pt:Ser/Plas:Qn:: <0.06
Troponin I.cardiac:MCnc:Pt:Ser/Plas:Qn:: <0.06

## 2019-04-01 LAB — CBC W/ AUTO DIFF
BASOPHILS ABSOLUTE COUNT: 0.1 10*9/L (ref 0.0–0.1)
BASOPHILS RELATIVE PERCENT: 1 %
EOSINOPHILS ABSOLUTE COUNT: 0.3 10*9/L (ref 0.0–0.7)
EOSINOPHILS RELATIVE PERCENT: 2.8 %
HEMATOCRIT: 35.3 % — ABNORMAL LOW (ref 38.0–50.0)
HEMOGLOBIN: 11.8 g/dL — ABNORMAL LOW (ref 13.5–17.5)
LYMPHOCYTES ABSOLUTE COUNT: 2 10*9/L (ref 0.7–4.0)
LYMPHOCYTES RELATIVE PERCENT: 21.6 %
MEAN CORPUSCULAR HEMOGLOBIN CONC: 33.2 g/dL (ref 30.0–36.0)
MEAN CORPUSCULAR HEMOGLOBIN: 26.5 pg (ref 26.0–34.0)
MEAN PLATELET VOLUME: 8.2 fL (ref 7.0–10.0)
MONOCYTES ABSOLUTE COUNT: 0.6 10*9/L (ref 0.1–1.0)
MONOCYTES RELATIVE PERCENT: 6.6 %
NEUTROPHILS ABSOLUTE COUNT: 6.4 10*9/L (ref 1.7–7.7)
NEUTROPHILS RELATIVE PERCENT: 68 %
PLATELET COUNT: 260 10*9/L (ref 150–450)
RED BLOOD CELL COUNT: 4.43 10*12/L (ref 4.32–5.72)
WBC ADJUSTED: 9.4 10*9/L (ref 3.5–10.5)

## 2019-04-01 LAB — PROTIME: Coagulation tissue factor induced:Time:Pt:PPP:Qn:Coag: 18.5 — ABNORMAL HIGH

## 2019-04-01 LAB — CO2: Carbon dioxide:SCnc:Pt:Ser/Plas:Qn:: 19 — ABNORMAL LOW

## 2019-04-01 LAB — MEAN CORPUSCULAR HEMOGLOBIN: Erythrocyte mean corpuscular hemoglobin:EntMass:Pt:RBC:Qn:Automated count: 26.5

## 2019-04-01 LAB — HEPARIN CORRELATION: Lab: 0.2

## 2019-04-01 NOTE — Unmapped (Signed)
[facility] Emergency Department Provider Note    ED Clinical Impression     Final diagnoses:   Chest pain, unspecified type (Primary)   Acute electrocardiogram changes   CKD (chronic kidney disease), stage IV (CMS-[name])       Initial Impression, ED Course, Assessment and Plan     This is a 49 yo M with a h/o CKD, CAD, AICD, DM, HTN, HLD, CHF, TIA, A fib who presents with chest pain and SOB. Symptoms started several hours pta while at rest. No nausea, sweating, worsening LE edema, calf pain, back pain, abd pain. No treatment pta. VS WNL. PE sig for slightly uncomfortable appearance, lungs CTAB, RRR, no m/g/r appreciated, trace BLE edema, no calf tenderness, abd soft, protuberant without pulsatile masses appreciated. EKG with new TWI in precordial leads. Trop neg x 2. Elevated creatinine from baseline. CXR with no acute process. Pt given ASA, Fentanyl. DDx includes ACS. Less likely PE or dissection based on history. Pt updated with results. Admission was strongly recommended but he declined, stating that he would f/u with his Cardiologist next week. He stated that he has been told he would not survive another cardiac procedure and that if he has any more IV contrast, that he will need to go on dialysis. His wife was also included in the discussion and is in agreement with admission. Pt advised of risks of leaving AMA including permanent disability and death. Pt advised to return at any time for admission if he changes his mind. Usual and customary return precautions given.    I independently visualized the EKG tracing.   I independently visualized the radiology images.   I reviewed the patient's prior medical records.   I obtained additional history from his wife.       ____________________________________________    Time seen: April 01, 2019 1:41 AM       History     Chief Complaint  Chest Pain      HPI Jeremy Oliver is a 49 y.o. male with a h/o CAD, CHF, CKD, HTN, DM, A fib, AICD, TIA, morbid obesity who presents with CP and SOB. Per the pt, symptoms started several hours PTA while he was at rest. CP described as a moderate, constant SSCP. No fever, back pain, abd pain, worsening LE edema, calf pain, nausea, sweating. He reports profuse sweating with his prior MI. Pt recently stopped then restarted several of his medications in preparation for a surgery that has since been canceled. No changes in diet or activities. No sick contacts or recent travel. No ASA prior to arrival.      Past Medical History:   Diagnosis Date   ??? Angina at rest (CMS-[name])    ??? Arthritis    ??? Asthma    ??? Atrial fibrillation and flutter (CMS-[name]) 06/08/14   ??? BMI 40.0-44.9, adult (CMS-[name]) 10/29/2015   ??? Chronic anticoagulation 02/17/2018   ??? Chronic kidney disease, stage 3    ??? Chronic pain syndrome 04/11/2014    ~Use daily lyrica as recommended ~Tramadol as needed for pain ~Fluoxetine daily as recommended ~Daily exercise ~Eat a healthy diet ~Good control of your blood sugars can help    ??? Coronary artery disease    ??? Depression    ??? Diabetes mellitus (CMS-[name])    ??? Eye trauma 1998    glass in both eyes and removed   ??? GERD (gastroesophageal reflux disease)    ??? Gout    ??? Homeless    ???  Hyperlipidemia    ??? Hypertension    ??? Illiteracy and low-level literacy    ??? Microscopic hematuria    ??? Migraine    ??? Nephrolithiasis    ??? 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 Nonalcoholic fatty liver disease ??? NSTEMI (non-ST elevated myocardial infarction) (CMS-[name]) 01/13/2017   ??? Obesity    ??? Obstructive sleep apnea 2009   ??? Panic attacks    ??? 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 Seizure-like activity (CMS-[name])     ~Monitor for symptoms and report them to your primary care provider if they occur    ??? Systolic CHF, chronic (CMS-[name])     EF 40-45% 2017   ??? TIA (transient ischemic attack)        Past Surgical History:   Procedure Laterality Date   ??? CARDIAC CATHETERIZATION  03/08/2007   ??? CORONARY STENT PLACEMENT     ??? CYSTOURETHROSCOPY  12/31/2009     Microscopic hematuria   ??? KNEE SURGERY Right 09/23/2006    arthroscopic knee surgery medial and lateral meniscus tears as well as crystal disease   ??? PR CATH PLACE/CORON ANGIO, IMG SUPER/INTERP,R&L HRT CATH, L HRT VENTRIC N/A 02/13/2017    Procedure: Left/Right Heart Catheterization;  Surgeon: Marlaine Hind, MD;  Location: [facility] CATH;  Service: Cardiology   ??? PR CATH PLACE/CORON ANGIO, IMG SUPER/INTERP,W LEFT HEART VENTRICULOGRAPHY N/A 03/17/2017    Procedure: Left Heart Catheterization W Intervention;  Surgeon: Marlaine Hind, MD;  Location: [facility] CATH;  Service: Cardiology   ??? PR CATH PLACE/CORON ANGIO, IMG SUPER/INTERP,W LEFT HEART VENTRICULOGRAPHY N/A 05/12/2018    Procedure: CATH LEFT HEART CATHETERIZATION W INTERVENTION;  Surgeon: Dorathy Kinsman, MD;  Location: [facility] CATH;  Service: Cardiology   ??? PR CATH PLACE/CORON ANGIO, IMG SUPER/INTERP,W LEFT HEART VENTRICULOGRAPHY N/A 10/22/2018    Procedure: CATH LEFT HEART CATHETERIZATION W INTERVENTION;  Surgeon: Marlaine Hind, MD;  Location: [facility] CATH;  Service: Cardiology   ??? PR ECMO/ECLS INITIATION VENO-ARTERIAL N/A 03/19/2017    Procedure: ECMO/ECLS; INITIATION, VENO-ARTERIAL;  Surgeon: Lennie Odor, MD;  Location: MAIN OR [facility];  Service: Cardiac Surgery   ??? PR ECMO/ECLS RMVL PRPH CANNULA OPEN 6 YRS & OLDER Left 03/25/2017 Procedure: ECMO / ECLS PROVIDED BY PHYSICIAN; REMOVAL OF PERIPHERAL (ARTERIAL AND/OR VENOUS) CANNULA(E), OPEN, 6 YEARS AND OLDER;  Surgeon: Alonna Buckler Ikonomidis, MD;  Location: MAIN OR [facility];  Service: Cardiac Surgery   ??? PR EPHYS EVAL W/ ABLATION SUPRAVENT ARRHYTHMIA N/A 07/19/2015    Catheter ablation of cavotricuspid isthmus for atrial flutter [facility])   ??? PR INSER HART PACER XVENOUS ATRIAL N/A 05/30/2015    [employer] dual-chamber pacemaker implant (E.Chung)   ??? PR INSERT/PLACE FLOW DIRECT CATH N/A 03/17/2017    Procedure: Insert Leave In [place];  Surgeon: Marlaine Hind, MD;  Location: [facility] CATH;  Service: Cardiology   ??? PR INSJ/RPLCMT PERM DFB W/TRNSVNS LDS 1/DUAL CHMBR N/A 05/04/2017    Procedure: ICD Implant System (Single/Dual);  Surgeon: Eldred Manges, MD;  Location: [facility] CATH;  Service: Cardiology   ??? PR NEGATIVE PRESSURE  WOUND THERAPY DME >50 SQ CM Left 03/25/2017    Procedure: Neg Press Wound Tx (Vac Assist) Incl Topicals, Per Session, Tsa Greater Than/= 50 Cm Squared;  Surgeon: Alonna Buckler Ikonomidis, MD;  Location: MAIN OR [facility];  Service: Cardiac Surgery   ??? PR PRQ TRLUML CORONARY STENT W/ANGIO ONE ART/BRNCH N/A 03/12/2018    Procedure: Percutaneous Coronary Intervention;  Surgeon: Marlaine Hind, MD;  Location: [facility] CATH;  Service: Cardiology   ??? PR REBL VES DIRECT,LOW EXTREM Left 03/19/2017    Procedure: Repr Bld Vessel Direct; Lower Extrem;  Surgeon: Boykin Reaper, MD;  Location: MAIN OR [facility];  Service: Vascular   ??? PR REMV ART CLOT ILIAC-POP,LEG INCIS Left 03/25/2017    Procedure: EMBOLECTOMY OR THROMBECTOMY, WITH OR WITHOUT CATHETER; FEMOROPOPLITEAL, AORTOILIAC ARTERY, BY LEG INCISION;  Surgeon: Alonna Buckler Ikonomidis, MD;  Location: MAIN OR [facility];  Service: Cardiac Surgery   ??? PR UPPER GI ENDOSCOPY,BIOPSY N/A 02/28/2016 Procedure: UGI ENDOSCOPY; WITH BIOPSY, SINGLE OR MULTIPLE;  Surgeon: Liane Comber, MD;  Location: [facility];  Service: Gastroenterology   ??? PR UPPER GI ENDOSCOPY,DIAGNOSIS N/A 05/07/2016    Procedure: UGI ENDO, INCLUDE ESOPHAGUS, STOMACH, & DUODENUM &/OR JEJUNUM; DX W/WO COLLECTION SPECIMN, BY BRUSH OR WASH;  Surgeon: Modena Nunnery, MD;  Location: [facility];  Service: Gastroenterology         Current Facility-Administered Medications:   ???  aspirin chewable tablet 324 mg, 324 mg, Oral, Once, Jamas Lav, MD  ???  fentaNYL (PF) (SUBLIMAZE) injection 50 mcg, 50 mcg, Intravenous, Once, Mackinley Kiehn Burman Riis, MD    Current Outpatient Medications:   ???  acetaminophen (TYLENOL) 500 MG tablet, TAKE 2 TABLETS (1 000MG ) BY MOUTH EVERY 8 HOURS AS NEEDED FOR PAIN, Disp: 180 each, Rfl: 3  ???  albuterol HFA 90 mcg/actuation inhaler, Inhale 2 puffs into the lungs every 6 (six) hours as needed for wheezing or shortness of breath., Disp: 18 g, Rfl: 2  ???  alirocumab (PRALUENT) 150 mg/mL subcutaneous injection, Inject 1 mL (150 mg total) under the skin every fourteen (14) days., Disp: 4 Syringe, Rfl: 11  ???  allopurinoL (ZYLOPRIM) 100 MG tablet, Take 1.5 tablets (150 mg total) by mouth daily., Disp: 60 tablet, Rfl: 3  ???  amiodarone (PACERONE) 200 MG tablet, TAKE 1 TABLET BY MOUTH ONCE DAILY, Disp: 90 tablet, Rfl: 3  ???  amLODIPine (NORVASC) 10 MG tablet, Take 1 tablet (10 mg total) by mouth daily., Disp: 90 tablet, Rfl: 3  ???  arm brace Misc, 1 each by Miscellaneous route daily., Disp: 1 each, Rfl: 0  ???  atorvastatin (LIPITOR) 80 MG tablet, Take 1 tablet (80 mg total) by mouth daily., Disp: 90 tablet, Rfl: 3  ???  blood sugar diagnostic Strp, Disp to match current ins approved meter. Use to test blood sugar four times daily. E11.22, Z79.4, Disp: 400 each, Rfl: 3  ???  blood-glucose meter kit, Disp. blood glucose meter kit preferred by patient's insurance. Dx: Diabetes, E11.9, Disp: 1 each, Rfl: 11 ???  bumetanide (BUMEX) 2 MG tablet, Take 1 tablet (2 mg total) by mouth two (2) times a day., Disp: 180 tablet, Rfl: 3  ???  carvediloL (COREG) 25 MG tablet, Take 1.5 tablets (37.5 mg total) by mouth Two (2) times a day., Disp: 270 tablet, Rfl: 3  ???  cholecalciferol, vitamin D3, 5,000 unit capsule, Take 1 capsule (5,000 Units total) by mouth daily., Disp: 100 capsule, Rfl: 0  ???  diclofenac sodium (  VOLTAREN) 1 % gel, Apply 2 grams topically Three (3) times a day., Disp: 100 g, Rfl: 11  ???  empty container Misc, Use as directed to dispose of injectable medications, Disp: 1 each, Rfl: 3  ???  ezetimibe (ZETIA) 10 mg tablet, TAKE 1 TABLET BY MOUTH ONCE DAILY, Disp: 90 each, Rfl: 3  ???  FLUoxetine (PROZAC) 40 MG capsule, Take 2 capsules (80 mg total) by mouth daily., Disp: 180 capsule, Rfl: 3  ???  gabapentin (NEURONTIN) 300 MG capsule, Take 1 capsule (300 mg total) by mouth two (2) times a day., Disp: 180 capsule, Rfl: 3  ???  hydrALAZINE (APRESOLINE) 25 MG tablet, Take 1 tablet (25 mg total) by mouth Three (3) times a day., Disp: 270 tablet, Rfl: 3  ???  insulin ASPART (NOVOLOG FLEXPEN U-100 INSULIN) 100 unit/mL (3 mL) injection pen, Inject 20 units under the skin three (3) times a day with a meal., Disp: 15 mL, Rfl: 11  ???  insulin detemir U-100 (LEVEMIR) 100 unit/mL (3 mL) injection pen, Inject 40 Units total under the skin nightly., Disp: 15 mL, Rfl: 3  ???  insulin syringe-needle U-100 0.3 mL 31 gauge x 5/16 Syrg, Use to inject insulin twice daily with meals, Disp: 60 each, Rfl: 0  ???  isosorbide mononitrate (IMDUR) 30 MG 24 hr tablet, Take 4 tablets (120 mg total) by mouth daily., Disp: 120 tablet, Rfl: 3  ???  lancets Misc, Disp. Ins preferred lancets. Use to test blood sugar four times daily. E11.22, Z79.4, Disp: 400 each, Rfl: 3  ???  loperamide (IMODIUM) 2 mg capsule, Take 1 capsule (2 mg total) by mouth 4 (four) times a day as needed for diarrhea., Disp: 30 capsule, Rfl: 0 ???  nitroglycerin (NITROSTAT) 0.4 MG SL tablet, Place 1 tablet (0.4 mg total) under the tongue every five (5) minutes as needed for chest pain., Disp: 100 each, Rfl: 3  ???  omeprazole (PRILOSEC) 40 MG capsule, Take 1 capsule (40 mg total) by mouth Two (2) times a day (30 minutes before a meal)., Disp: 180 capsule, Rfl: 3  ???  ondansetron (ZOFRAN) 4 MG tablet, Take 1 tablet (4 mg total) by mouth every eight (8) hours as needed for nausea for up to 4 days. (Patient not taking: Reported on 01/26/2019), Disp: 10 tablet, Rfl: 0  ???  QUEtiapine (SEROQUEL) 50 MG tablet, Take 50 mg by mouth. Takes prn, Disp: , Rfl:   ???  rivaroxaban (XARELTO) 15 mg Tab, Take 1 tablet (15 mg total) by mouth daily with evening meal., Disp: 90 tablet, Rfl: 3  ???  semaglutide 0.25 mg or 0.5 mg(2 mg/1.5 mL) PnIj, Inject 0.5 mg under the skin every seven (7) days., Disp: 4.5 mL, Rfl: 3  ???  sevelamer (RENVELA) 800 mg tablet, TAKE 2 TABLETS (1600MG ) BY MOUTH THREE TIMES DAILY WITH MEALS, Disp: 540 tablet, Rfl: 3  ???  sodium polystyrene, SPS, with sorbitol (SPS, WITH SORBITOL,) 15-20 gram/60 mL Susp, Take 60 mL (15 g) by mouth once a week., Disp: 473 mL, Rfl: 3  ???  ticagrelor (BRILINTA) 90 mg Tab, TAKE 1 TABLET BY MOUTH TWICE DAILY (Patient not taking: Reported on 01/26/2019), Disp: 180 tablet, Rfl: 3  ???  UNIFINE PENTIPS 31 gauge x 5/16 (8 mm) Ndle, USE WITH INSULIN AND VICTOZA 5 TIMES DAILY, Disp: 400 each, Rfl: 3    Allergies  Losartan    Family History   Problem Relation Age of Onset   ??? Diabetes Mother    ???  Hyperlipidemia Mother    ??? Heart disease Mother    ??? Basal cell carcinoma Mother    ??? Blindness Mother         diabeties   ??? Obesity Mother    ??? Hyperlipidemia Father    ??? Heart disease Father    ??? Lung disease Father    ??? Heart disease Half-Sister    ??? Kidney disease Half-Sister    ??? Gallbladder disease Half-Brother    ??? Obesity Half-Brother    ??? No Known Problems Daughter    ??? No Known Problems Son    ??? Obesity Daughter    ??? Melanoma Neg Hx ??? Squamous cell carcinoma Neg Hx    ??? Macular degeneration Neg Hx    ??? Glaucoma Neg Hx        Social History  Social History     Tobacco Use   ??? Smoking status: Never Smoker   ??? Smokeless tobacco: Current User     Types: Chew   ??? Tobacco comment: Pt in process of reducing tobacco use,  dips 1 can every 2 weeks,   Substance Use Topics   ??? Alcohol use: Never     Alcohol/week: 0.0 standard drinks     Frequency: Never     Comment: rare use: couple times a year, small amount   ??? Drug use: No       Review of Systems  Constitutional: Negative for fever.  Eyes: Negative for visual changes.  ENT: Negative for sore throat.  Cardiovascular: Positive for chest pain.  Respiratory: Positive for shortness of breath.  Gastrointestinal: Negative for abdominal pain, vomiting or diarrhea.  Genitourinary: Negative for dysuria.  Musculoskeletal: Negative for back pain.  Skin: Negative for rash.  Neurological: Negative for headaches, weakness or numbness.      Physical Exam     VITAL SIGNS:    ED Triage Vitals [04/01/19 0119]   Enc Vitals Group      BP 122/85      Heart Rate 94      SpO2 Pulse 93      Resp 18      Temp       Temp src       SpO2 98 %       Constitutional: Alert and oriented. Appears mildly uncomfortable.  Eyes: Conjunctivae are normal.  ENT       Head: Normocephalic and atraumatic.       Nose: No congestion.       Mouth/Throat: Mucous membranes are moist.       Neck: No stridor, neck is supple. No bruits appreciated.  Hematological/Lymphatic/Immunilogical: No cervical lymphadenopathy.  Cardiovascular: Normal rate, regular rhythm. No m/g/r. Normal and symmetric distal pulses are present in all extremities.   Respiratory: Normal respiratory effort. Breath sounds are normal. No wheezes, rales, rhonchi.  Gastrointestinal: Soft and nontender. Protuberant. No pulsatile masses appreciated. There is no CVA tenderness.  Musculoskeletal: Nontender with normal range of motion in all extremities. Right lower leg: No tenderness. Trace edema.       Left lower leg: No tenderness. Trace edema.  Neurologic: Normal speech and language. No gross focal neurologic deficits are appreciated.    Skin: Skin is warm, dry and intact. No rash noted.  Psychiatric: Mood and affect are normal. Speech and behavior are normal.      EKG      Adult ECG Report, 116 am     Name: Jeremy Oliver   Age: 49  y.o.   Gender: male       Rate: 76   Rhythm: normal sinus rhythm   Narrative Interpretation: 1st degree AV block, LVH, new TWI in V4-6     Adult ECG Report, 148 am     Name: Jeremy Oliver   Age: 49 y.o.   Gender: male       Rate: 63   Rhythm: normal sinus rhythm   Narrative Interpretation: unchanged from prior        Radiology     XR Chest Portable   Final Result      No acute airspace disease.            Pertinent labs & imaging results that were available during my care of the patient were reviewed by me and considered in my medical decision making (see chart for details).     Jamas Lav, MD  04/03/19 1059

## 2019-04-01 NOTE — Unmapped (Signed)
Patient reports CP and difficulty breathing.  Has history of MI in past.  No meds PTA

## 2019-04-04 NOTE — Unmapped (Addendum)
04/06/19: zofran rx ordered     Addended:  [name] called CCM back, asks for refill on zofran. Pt denies need to be seen. Chronic emesis. Requests [facility].  Will notify provider.     PCP Action:pended ondansetron rx.          PCP Action: pt had left vm re: emesis, attempted to make appt, pt calls back and states does not need appt, just zofran refill. Pended zofran, please advise.     CCM Action:unable to reach pt regarding symptoms today. pt does state covid negative and has still been throwimng up. following up with scheduling for telehealth and/or [facility] appt     Patient Action: pt refuses telehealth, requests zofran refill.         Pt called in states recently at ER , covid negative, but still throwing up sometimes.  Would like to discuss w/ a provider.  CCM Returned call to pt to discuss symptoms further,  unable to contact pt.    Will notify scheduling to have pt speak to a provider today and/ or have [facility] appt.

## 2019-04-05 NOTE — Unmapped (Signed)
Negative COVID19 result received.  Patient notified via results known in hospital.

## 2019-04-06 MED ORDER — BLOOD PRESSURE CUFF
0 refills | 0 days | Status: CP
Start: 2019-04-06 — End: ?

## 2019-04-06 MED ORDER — ONDANSETRON HCL 4 MG TABLET
ORAL_TABLET | Freq: Three times a day (TID) | ORAL | 0 refills | 10 days | Status: CP | PRN
Start: 2019-04-06 — End: ?
  Filled 2019-04-07: qty 30, 10d supply, fill #0

## 2019-04-06 MED FILL — PRALUENT PEN 150 MG/ML SUBCUTANEOUS PEN INJECTOR: SUBCUTANEOUS | 56 days supply | Qty: 4 | Fill #5

## 2019-04-06 MED FILL — EMPTY CONTAINER: 120 days supply | Qty: 1 | Fill #1 | Status: AC

## 2019-04-06 MED FILL — PRALUENT PEN 150 MG/ML SUBCUTANEOUS PEN INJECTOR: 56 days supply | Qty: 4 | Fill #5 | Status: AC

## 2019-04-06 MED FILL — EMPTY CONTAINER: 120 days supply | Qty: 1 | Fill #1

## 2019-04-06 NOTE — Unmapped (Signed)
-----   Message from Rayford Halsted, PharmD sent at 04/06/2019  1:53 PM EST -----  Regarding: Request for BP monitor order and refill of zofran  Hi Dr. Hulan Fess,    Mr. Spoto lost his blood pressure monitor and is requesting another one. He states he cannot afford one at this time out of pocket. I can put it on an account and send a bill if I have a prescription for it. He asked me to reach out to see if you would be willing to provide a prescription for a wrist blood pressure monitor.    He also asked if I could follow up on a zofran refill request we sent earlier.     Please contact [facility] with questions or concerns.     Thanks,  Marylou Flesher  [phone]

## 2019-04-07 ENCOUNTER — Ambulatory Visit: Admit: 2019-04-07 | Discharge: 2019-04-08 | Payer: MEDICARE

## 2019-04-07 DIAGNOSIS — G4733 Obstructive sleep apnea (adult) (pediatric): Principal | ICD-10-CM

## 2019-04-07 DIAGNOSIS — N184 Chronic kidney disease, stage 4 (severe): Secondary | ICD-10-CM

## 2019-04-07 DIAGNOSIS — I10 Essential (primary) hypertension: Principal | ICD-10-CM

## 2019-04-07 DIAGNOSIS — N185 Chronic kidney disease, stage 5: Principal | ICD-10-CM

## 2019-04-07 DIAGNOSIS — E1122 Type 2 diabetes mellitus with diabetic chronic kidney disease: Secondary | ICD-10-CM

## 2019-04-07 DIAGNOSIS — Z794 Long term (current) use of insulin: Principal | ICD-10-CM

## 2019-04-07 LAB — PROTEIN / CREATININE RATIO, URINE
CREATININE, URINE: 80.3 mg/dL
PROTEIN/CREAT RATIO, URINE: 12.217

## 2019-04-07 LAB — CREATININE, URINE: Lab: 80.3

## 2019-04-07 MED FILL — ALLOPURINOL 100 MG TABLET: ORAL | 40 days supply | Qty: 60 | Fill #2

## 2019-04-07 MED FILL — NOVOLOG FLEXPEN U-100 INSULIN ASPART 100 UNIT/ML (3 ML) SUBCUTANEOUS: 25 days supply | Qty: 15 | Fill #3 | Status: AC

## 2019-04-07 MED FILL — EZETIMIBE 10 MG TABLET: 90 days supply | Qty: 90 | Fill #0 | Status: AC

## 2019-04-07 MED FILL — NOVOLOG FLEXPEN U-100 INSULIN ASPART 100 UNIT/ML (3 ML) SUBCUTANEOUS: SUBCUTANEOUS | 25 days supply | Qty: 15 | Fill #3

## 2019-04-07 MED FILL — OZEMPIC 0.25 MG OR 0.5 MG (2 MG/1.5 ML) SUBCUTANEOUS PEN INJECTOR: SUBCUTANEOUS | 84 days supply | Qty: 4.5 | Fill #1

## 2019-04-07 MED FILL — GABAPENTIN 300 MG CAPSULE: ORAL | 90 days supply | Qty: 180 | Fill #0

## 2019-04-07 MED FILL — OZEMPIC 0.25 MG OR 0.5 MG (2 MG/1.5 ML) SUBCUTANEOUS PEN INJECTOR: 84 days supply | Qty: 4 | Fill #1 | Status: AC

## 2019-04-07 MED FILL — CARVEDILOL 25 MG TABLET: 90 days supply | Qty: 270 | Fill #1 | Status: AC

## 2019-04-07 MED FILL — ISOSORBIDE MONONITRATE ER 30 MG TABLET,EXTENDED RELEASE 24 HR: 30 days supply | Qty: 120 | Fill #2 | Status: AC

## 2019-04-07 MED FILL — ALLOPURINOL 100 MG TABLET: 40 days supply | Qty: 60 | Fill #2 | Status: AC

## 2019-04-07 MED FILL — UNIFINE PENTIPS 31 GAUGE X 5/16" NEEDLE (8 MM): 80 days supply | Qty: 400 | Fill #0 | Status: AC

## 2019-04-07 MED FILL — ACETAMINOPHEN 500 MG TABLET: 30 days supply | Qty: 180 | Fill #0 | Status: AC

## 2019-04-07 MED FILL — ISOSORBIDE MONONITRATE ER 30 MG TABLET,EXTENDED RELEASE 24 HR: ORAL | 30 days supply | Qty: 120 | Fill #2

## 2019-04-07 MED FILL — VENTOLIN HFA 90 MCG/ACTUATION AEROSOL INHALER: 25 days supply | Qty: 18 | Fill #0 | Status: AC

## 2019-04-07 MED FILL — ONDANSETRON HCL 4 MG TABLET: 10 days supply | Qty: 30 | Fill #0 | Status: AC

## 2019-04-07 MED FILL — CARVEDILOL 25 MG TABLET: ORAL | 90 days supply | Qty: 270 | Fill #1

## 2019-04-07 MED FILL — GABAPENTIN 300 MG CAPSULE: 90 days supply | Qty: 180 | Fill #0 | Status: AC

## 2019-04-07 NOTE — Unmapped (Signed)
DIVISION OF NEPHROLOGY AND HYPERTENSION  [facility]  [address]  [place], [place] [address]         Date of Service: 04/07/2019      PCP: Referring Provider:   Kurtis Bushman, MD  [address] [place] [address]  [place] [address]  Phone: [phone]  Fax: [phone] Roma Schanz, MD  [address]  [place] [address]  [place],  [place] [address]  Phone: [phone]  Fax: [phone]       04/07/2019    Background: Jeremy Oliver was first seen by [place] in the hospital Jan, 2019 with acute on chronic kidney disease. He has CAD with repeated hospitalizations for chest pain, HFrEF, HTN, paroxysmal atrial fibrillation, DM2 and OSA (on CPAP) who was temporarily on HD during period of AKI. He is followed closely by PCP and cardiology and has ongoing CAD, but intervention will most certainly push him into renal failure and the need for RRT.    He and his wife face significant social challenges without a reliable home. They are now living in a building of his nephew's for a small amount of rent.    Chief Complaint: fu CKD, HTN, DM2    HPI:  Jeremy Oliver presents for fu above issues. I last saw him 12/16/2018. Today, he reports a recent ER visit on New Year's Day due to chest pressure. He endorses chronic nausea and vomiting, but it is worse lately, particularly after eating greasy foods. He notes that his sugars are running fine.    He went to ER recently after over-exerting himself at the store walking around. He also notes feeling cold a lot and SOB with exertion.    He has a CPAP but does not have the supplies. His wife thinks he needs another sleep study.    He is drinking a lot of soda, but is trying to drink 2 waters for every soda to counterbalance.    ROS:   CONSTITUTIONAL: denies fevers or chills, denies unintentional weight loss  CARDIOVASCULAR: denies chest pain, denies dyspnea on exertion, denies leg edema GASTROINTESTINAL: enodrses nausea/vomiting, denies anorexia  GENITOURINARY: denies dysuria, denies hematuria, denies decreased urinary stream  All other systems reviewed and are negative except as listed above.    PAST MEDICAL HISTORY:  Past Medical History:   Diagnosis Date   ??? Angina at rest (CMS-[name])    ??? Arthritis    ??? Asthma    ??? Atrial fibrillation and flutter (CMS-[name]) 06/08/14   ??? BMI 40.0-44.9, adult (CMS-[name]) 10/29/2015   ??? Chronic anticoagulation 02/17/2018   ??? Chronic kidney disease, stage 3    ??? Chronic pain syndrome 04/11/2014    ~Use daily lyrica as recommended ~Tramadol as needed for pain ~Fluoxetine daily as recommended ~Daily exercise ~Eat a healthy diet ~Good control of your blood sugars can help    ??? Coronary artery disease    ??? Depression    ??? Diabetes mellitus (CMS-[name])    ??? Eye trauma 1998    glass in both eyes and removed   ??? GERD (gastroesophageal reflux disease)    ??? Gout    ??? Homeless    ??? Hyperlipidemia    ??? Hypertension    ??? Illiteracy and low-level literacy    ??? Microscopic hematuria    ??? Migraine    ??? Nephrolithiasis    ??? 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 Nonalcoholic fatty liver disease    ??? NSTEMI (non-ST elevated myocardial infarction) (CMS-[name]) 01/13/2017   ??? Obesity    ??? Obstructive sleep apnea 2009   ??? Panic attacks    ??? 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 Seizure-like activity (CMS-[name])     ~Monitor for symptoms and report them to your primary care provider if they occur    ??? Systolic CHF, chronic (CMS-[name])     EF 40-45% 2017 ??? TIA (transient ischemic attack)        PAST SURGICAL HISTORY:  Past Surgical History:   Procedure Laterality Date   ??? CARDIAC CATHETERIZATION  03/08/2007   ??? CORONARY STENT PLACEMENT     ??? CYSTOURETHROSCOPY  12/31/2009     Microscopic hematuria   ??? KNEE SURGERY Right 09/23/2006    arthroscopic knee surgery medial and lateral meniscus tears as well as crystal disease   ??? PR CATH PLACE/CORON ANGIO, IMG SUPER/INTERP,R&L HRT CATH, L HRT VENTRIC N/A 02/13/2017    Procedure: Left/Right Heart Catheterization;  Surgeon: Marlaine Hind, MD;  Location: [facility] CATH;  Service: Cardiology   ??? PR CATH PLACE/CORON ANGIO, IMG SUPER/INTERP,W LEFT HEART VENTRICULOGRAPHY N/A 03/17/2017    Procedure: Left Heart Catheterization W Intervention;  Surgeon: Marlaine Hind, MD;  Location: [facility] CATH;  Service: Cardiology   ??? PR CATH PLACE/CORON ANGIO, IMG SUPER/INTERP,W LEFT HEART VENTRICULOGRAPHY N/A 05/12/2018    Procedure: CATH LEFT HEART CATHETERIZATION W INTERVENTION;  Surgeon: Dorathy Kinsman, MD;  Location: [facility] CATH;  Service: Cardiology   ??? PR CATH PLACE/CORON ANGIO, IMG SUPER/INTERP,W LEFT HEART VENTRICULOGRAPHY N/A 10/22/2018    Procedure: CATH LEFT HEART CATHETERIZATION W INTERVENTION;  Surgeon: Marlaine Hind, MD;  Location: [facility] CATH;  Service: Cardiology   ??? PR ECMO/ECLS INITIATION VENO-ARTERIAL N/A 03/19/2017    Procedure: ECMO/ECLS; INITIATION, VENO-ARTERIAL;  Surgeon: Lennie Odor, MD;  Location: MAIN OR [facility];  Service: Cardiac Surgery   ??? PR ECMO/ECLS RMVL PRPH CANNULA OPEN 6 YRS & OLDER Left 03/25/2017    Procedure: ECMO / ECLS PROVIDED BY PHYSICIAN; REMOVAL OF PERIPHERAL (ARTERIAL AND/OR VENOUS) CANNULA(E), OPEN, 6 YEARS AND OLDER;  Surgeon: Alonna Buckler Ikonomidis, MD;  Location: MAIN OR [facility];  Service: Cardiac Surgery   ??? PR EPHYS EVAL W/ ABLATION SUPRAVENT ARRHYTHMIA N/A 07/19/2015    Catheter ablation of cavotricuspid isthmus for atrial flutter [facility]) ??? PR INSER HART PACER XVENOUS ATRIAL N/A 05/30/2015    [employer] dual-chamber pacemaker implant (E.Chung)   ??? PR INSERT/PLACE FLOW DIRECT CATH N/A 03/17/2017    Procedure: Insert Leave In [place];  Surgeon: Marlaine Hind, MD;  Location: [facility] CATH;  Service: Cardiology   ??? PR INSJ/RPLCMT PERM DFB W/TRNSVNS LDS 1/DUAL CHMBR N/A 05/04/2017    Procedure: ICD Implant System (Single/Dual);  Surgeon: Eldred Manges, MD;  Location: [facility] CATH;  Service: Cardiology   ??? PR NEGATIVE PRESSURE WOUND THERAPY DME >50 SQ CM Left 03/25/2017    Procedure: Neg Press Wound Tx (Vac Assist) Incl Topicals, Per Session, Tsa Greater Than/= 50 Cm Squared;  Surgeon: Jonny Ruiz Sotirios Ikonomidis,  MD;  Location: MAIN OR [facility];  Service: Cardiac Surgery   ??? PR PRQ TRLUML CORONARY STENT W/ANGIO ONE ART/BRNCH N/A 03/12/2018    Procedure: Percutaneous Coronary Intervention;  Surgeon: Marlaine Hind, MD;  Location: [facility] CATH;  Service: Cardiology   ??? PR REBL VES DIRECT,LOW EXTREM Left 03/19/2017    Procedure: Repr Bld Vessel Direct; Lower Extrem;  Surgeon: Boykin Reaper, MD;  Location: MAIN OR [facility];  Service: Vascular   ??? PR REMV ART CLOT ILIAC-POP,LEG INCIS Left 03/25/2017    Procedure: EMBOLECTOMY OR THROMBECTOMY, WITH OR WITHOUT CATHETER; FEMOROPOPLITEAL, AORTOILIAC ARTERY, BY LEG INCISION;  Surgeon: Alonna Buckler Ikonomidis, MD;  Location: MAIN OR [facility];  Service: Cardiac Surgery   ??? PR UPPER GI ENDOSCOPY,BIOPSY N/A 02/28/2016    Procedure: UGI ENDOSCOPY; WITH BIOPSY, SINGLE OR MULTIPLE;  Surgeon: Liane Comber, MD;  [facility]: [facility];  Service: Gastroenterology   ??? PR UPPER GI ENDOSCOPY,DIAGNOSIS N/A 05/07/2016    Procedure: UGI ENDO, INCLUDE ESOPHAGUS, STOMACH, & DUODENUM &/OR JEJUNUM; DX W/WO COLLECTION SPECIMN, BY BRUSH OR WASH;  Surgeon: Modena Nunnery, MD;  [facility]: [facility];  Service: Gastroenterology       SOCIAL HISTORY:  Social History     Socioeconomic History ??? Marital status: Married     Spouse name: [name]   ??? Number of children: 3   ??? Years of education: 58   ??? Highest education level: Not on file   Occupational History   ??? Occupation: DISABLED     Comment: Former Surveyor, quantity Needs   ??? Financial resource strain: Very hard   ??? Food insecurity     Worry: Sometimes true     Inability: Sometimes true   ??? Transportation needs     Medical: Yes     Non-medical: Yes   Tobacco Use   ??? Smoking status: Never Smoker   ??? Smokeless tobacco: Current User     Types: Chew   ??? Tobacco comment: Pt in process of reducing tobacco use,  dips 1 can every 2 weeks,   Substance and Sexual Activity   ??? Alcohol use: Never     Alcohol/week: 0.0 standard drinks     Frequency: Never     Comment: rare use: couple times a year, small amount   ??? Drug use: No   ??? Sexual activity: Yes     Partners: Female   Lifestyle   ??? Physical activity     Days per week: Not on file     Minutes per session: Not on file   ??? Stress: Not on file   Relationships   ??? Social Wellsite geologist on phone: More than three times a week     Gets together: More than three times a week     Attends religious service: 1 to 4 times per year     Active member of club or organization: No     Attends meetings of clubs or organizations: Never     Relationship status: Married   Other Topics Concern   ??? Do you use sunscreen? No   ??? Tanning bed use? No   ??? Are you easily burned? No   ??? Excessive sun exposure? No   ??? Blistering sunburns? No   Social History Narrative    12/01/18: pt living with wife and friends. Friends have two young children. [name] states soon they will have their own place, however doesn't know when. Pt states  it will be after the neighbors are evicted, we can move in. Pt will need to start dialysis soon. Pt and wife Not talking with daughter and grandson lately.             Information updated:  01/08/16. Reviewed 08/12/17:        Born in [place]    Raised with both parents Parents died w/in 6 mos of each other. His parents knew they were going to pass and told him just before.    Half sister and 2 half brothers        Married 30 yrs. Wife [name]    2 other children son and daughter, adopted out    Daughter Bridgette, bf DJ        Education - completed through 11th. 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    Former Presenter, broadcasting since age 28    Lives with wife, difficulty paying bills 12/30/17     Lives next door to daughter, husband and their grandson        05/19/18: pt moving to new home in next 5 days- after an eviction. Has Medicaid assistance again. Will be w/ wife [name], they babysit their grandson often.    06/14/18: Pt and husband living with daughter while new location to live is determined.       FAMILY HISTORY:  Unchanged from previous visit.  Family History   Problem Relation Age of Onset   ??? Diabetes Mother    ??? Hyperlipidemia Mother    ??? Heart disease Mother    ??? Basal cell carcinoma Mother    ??? Blindness Mother         diabeties   ??? Obesity Mother    ??? Hyperlipidemia Father    ??? Heart disease Father    ??? Lung disease Father    ??? Heart disease Half-Sister    ??? Kidney disease Half-Sister    ??? Gallbladder disease Half-Brother    ??? Obesity Half-Brother    ??? No Known Problems Daughter    ??? No Known Problems Son    ??? Obesity Daughter    ??? Melanoma Neg Hx    ??? Squamous cell carcinoma Neg Hx    ??? Macular degeneration Neg Hx    ??? Glaucoma Neg Hx        ALLERGIES:  Losartan    MEDICATIONS:  Current Outpatient Medications   Medication Sig Dispense Refill   ??? acetaminophen (TYLENOL) 500 MG tablet TAKE 2 TABLETS (1 000MG ) BY MOUTH EVERY 8 HOURS AS NEEDED FOR PAIN 180 each 3   ??? albuterol HFA 90 mcg/actuation inhaler Inhale 2 puffs into the lungs every 6 (six) hours as needed for wheezing or shortness of breath. 18 g 2 ??? alirocumab (PRALUENT) 150 mg/mL subcutaneous injection Inject 1 mL (150 mg total) under the skin every fourteen (14) days. 4 Syringe 11   ??? allopurinoL (ZYLOPRIM) 100 MG tablet Take 1.5 tablets (150 mg total) by mouth daily. 60 tablet 3   ??? amiodarone (PACERONE) 200 MG tablet TAKE 1 TABLET BY MOUTH ONCE DAILY 90 tablet 3   ??? amLODIPine (NORVASC) 10 MG tablet Take 1 tablet (10 mg total) by mouth daily. 90 tablet 3   ??? arm brace Misc 1 each by Miscellaneous route  daily. 1 each 0   ??? atorvastatin (LIPITOR) 80 MG tablet Take 1 tablet (80 mg total) by mouth daily. 90 tablet 3   ??? blood sugar diagnostic Strp Disp to match current ins approved meter. Use to test blood sugar four times daily. E11.22, Z79.4 400 each 3   ??? blood-glucose meter kit Disp. blood glucose meter kit preferred by patient's insurance. Dx: Diabetes, E11.9 1 each 11   ??? bumetanide (BUMEX) 2 MG tablet Take 1 tablet (2 mg total) by mouth two (2) times a day. 180 tablet 3   ??? carvediloL (COREG) 25 MG tablet Take 1.5 tablets (37.5 mg total) by mouth Two (2) times a day. 270 tablet 3   ??? cholecalciferol, vitamin D3, 5,000 unit capsule Take 1 capsule (5,000 Units total) by mouth daily. 100 capsule 0   ??? diclofenac sodium (VOLTAREN) 1 % gel Apply 2 grams topically Three (3) times a day. 100 g 11   ??? empty container Misc Use as directed to dispose of injectable medications 1 each 3   ??? ezetimibe (ZETIA) 10 mg tablet TAKE 1 TABLET BY MOUTH ONCE DAILY 90 each 3   ??? FLUoxetine (PROZAC) 40 MG capsule Take 2 capsules (80 mg total) by mouth daily. 180 capsule 3   ??? gabapentin (NEURONTIN) 300 MG capsule Take 1 capsule (300 mg total) by mouth two (2) times a day. 180 capsule 3   ??? insulin ASPART (NOVOLOG FLEXPEN U-100 INSULIN) 100 unit/mL (3 mL) injection pen Inject 20 units under the skin three (3) times a day with a meal. 15 mL 11   ??? insulin detemir U-100 (LEVEMIR) 100 unit/mL (3 mL) injection pen Inject 40 Units total under the skin nightly. 15 mL 3 ??? insulin syringe-needle U-100 0.3 mL 31 gauge x 5/16 Syrg Use to inject insulin twice daily with meals 60 each 0   ??? isosorbide mononitrate (IMDUR) 30 MG 24 hr tablet Take 4 tablets (120 mg total) by mouth daily. 120 tablet 3   ??? lancets Misc Disp. Ins preferred lancets. Use to test blood sugar four times daily. E11.22, Z79.4 400 each 3   ??? loperamide (IMODIUM) 2 mg capsule Take 1 capsule (2 mg total) by mouth 4 (four) times a day as needed for diarrhea. 30 capsule 0   ??? miscellaneous medical supply (BLOOD PRESSURE CUFF) Misc Measure BP once a day or if any symptoms 1 each 0   ??? nitroglycerin (NITROSTAT) 0.4 MG SL tablet Place 1 tablet (0.4 mg total) under the tongue every five (5) minutes as needed for chest pain. 100 each 3   ??? omeprazole (PRILOSEC) 40 MG capsule Take 1 capsule (40 mg total) by mouth Two (2) times a day (30 minutes before a meal). 180 capsule 3   ??? ondansetron (ZOFRAN) 4 MG tablet Take 1 tablet (4 mg total) by mouth every eight (8) hours as needed for nausea for up to 4 days. 10 tablet 0   ??? ondansetron (ZOFRAN) 4 MG tablet Take 1 tablet (4 mg total) by mouth every eight (8) hours as needed for nausea. 30 tablet 0   ??? rivaroxaban (XARELTO) 15 mg Tab Take 1 tablet (15 mg total) by mouth daily with evening meal. 90 tablet 3   ??? semaglutide 0.25 mg or 0.5 mg(2 mg/1.5 mL) PnIj Inject 0.5 mg under the skin every seven (7) days. 4.5 mL 3   ??? sevelamer (RENVELA) 800 mg tablet TAKE 2 TABLETS (1600MG ) BY MOUTH THREE TIMES DAILY WITH MEALS 540  tablet 3   ??? sodium polystyrene, SPS, with sorbitol (SPS, WITH SORBITOL,) 15-20 gram/60 mL Susp Take 60 mL (15 g) by mouth once a week. 473 mL 3   ??? ticagrelor (BRILINTA) 90 mg Tab TAKE 1 TABLET BY MOUTH TWICE DAILY 180 tablet 3   ??? UNIFINE PENTIPS 31 gauge x 5/16 (8 mm) Ndle USE WITH INSULIN AND VICTOZA 5 TIMES DAILY 400 each 3 ??? hydrALAZINE (APRESOLINE) 25 MG tablet Take 1 tablet (25 mg total) by mouth Three (3) times a day. (Patient not taking: Reported on 04/07/2019) 270 tablet 3   ??? QUEtiapine (SEROQUEL) 50 MG tablet Take 50 mg by mouth. Takes prn       No current facility-administered medications for this visit.        PHYSICAL EXAM:  Vitals:    04/07/19 0915   BP: 157/100   Pulse: 105   Temp: 36.3 ??C (97.4 ??F)     Body mass index is 43.27 kg/m??.  CONSTITUTIONAL: Alert,well appearing, no distress  HEENT: Moist mucous membranes, oropharynx clear without erythema or exudate  NECK: Supple, no lymphadenopathy  CARDIOVASCULAR: Regular, normal S1/S2 heart sounds, no murmurs, no rubs.   PULM: Clear to auscultation bilaterally  GASTROINTESTINAL: Soft, active bowel sounds, nontender  MSK/EXTREMITIES: Trace lower extremity edema bilaterally, dorsalis pedis pulses 2+ bilaterally   SKIN: No rashes or lesions  NEUROLOGIC: No focal motor or sensory deficits    BP Readings from Last 5 Encounters:   04/07/19 157/100   04/01/19 117/71   02/02/19 150/84   02/02/19 165/95   01/31/19 150/79       MEDICAL DECISION MAKING    Results for orders placed or performed during the hospital encounter of 04/01/19   RAPID INFLUENZA/RSV/COVID PCR    Specimen: Nasopharyngeal Swab   Result Value Ref Range    Influenza A Negative Negative    Influenza B Negative Negative, Not Detected    RSV Negative Negative    SARS-CoV-2 PCR Negative Negative   Troponin I   Result Value Ref Range    Troponin I <0.060 <0.060 ng/mL   PT-INR   Result Value Ref Range    PT 18.5 (H) 10.5 - 13.5 sec    INR 1.59    APTT   Result Value Ref Range    APTT 40.6 (H) 25.3 - 37.1 sec    Heparin Correlation 0.2    Comprehensive metabolic panel   Result Value Ref Range    Sodium 138 135 - 145 mmol/L    Potassium 4.8 3.5 - 5.0 mmol/L    Chloride 109 (H) 98 - 107 mmol/L    Anion Gap 10 7 - 15 mmol/L    CO2 19.0 (L) 22.0 - 30.0 mmol/L    BUN 32 (H) 7 - 21 mg/dL    Creatinine 1.61 (H) 0.70 - 1.30 mg/dL BUN/Creatinine Ratio 7     EGFR CKD-EPI Non-African American, Male 13 (L) >=60 mL/min/1.86m2    EGFR CKD-EPI African American, Male 15 (L) >=60 mL/min/1.6m2    Glucose 202 (H) 70 - 179 mg/dL    Calcium 7.6 (L) 8.5 - 10.2 mg/dL    Albumin 3.0 (L) 3.5 - 5.0 g/dL    Total Protein 5.7 (L) 6.5 - 8.3 g/dL    Total Bilirubin 0.3 0.0 - 1.2 mg/dL    AST 12 (L) 19 - 55 U/L    ALT 11 <50 U/L    Alkaline Phosphatase 89 38 - 126 U/L   Troponin I  Result Value Ref Range    Troponin I <0.060 <0.060 ng/mL   ECG 12 lead (Adult)   Result Value Ref Range    EKG Systolic BP  mmHg    EKG Diastolic BP  mmHg    EKG Ventricular Rate 86 BPM    EKG Atrial Rate 86 BPM    EKG P-R Interval 208 ms    EKG QRS Duration 92 ms    EKG Q-T Interval 414 ms    EKG QTC Calculation 495 ms    EKG Calculated P Axis 47 degrees    EKG Calculated R Axis 2 degrees    EKG Calculated T Axis 146 degrees    QTC Fredericia 467 ms   ECG 12 Lead   Result Value Ref Range    EKG Systolic BP  mmHg    EKG Diastolic BP  mmHg    EKG Ventricular Rate 89 BPM    EKG Atrial Rate 89 BPM    EKG P-R Interval 212 ms    EKG QRS Duration 88 ms    EKG Q-T Interval 398 ms    EKG QTC Calculation 484 ms    EKG Calculated P Axis 51 degrees    EKG Calculated R Axis 2 degrees    EKG Calculated T Axis 149 degrees    QTC Fredericia 453 ms   CBC w/ Differential   Result Value Ref Range    WBC 9.4 3.5 - 10.5 10*9/L    RBC 4.43 4.32 - 5.72 10*12/L    HGB 11.8 (L) 13.5 - 17.5 g/dL    HCT 16.1 (L) 09.6 - 50.0 %    MCV 79.9 (L) 81.0 - 95.0 fL    MCH 26.5 26.0 - 34.0 pg    MCHC 33.2 30.0 - 36.0 g/dL    RDW 04.5 (H) 40.9 - 15.0 %    MPV 8.2 7.0 - 10.0 fL    Platelet 260 150 - 450 10*9/L    Neutrophils % 68.0 %    Lymphocytes % 21.6 %    Monocytes % 6.6 %    Eosinophils % 2.8 %    Basophils % 1.0 %    Absolute Neutrophils 6.4 1.7 - 7.7 10*9/L    Absolute Lymphocytes 2.0 0.7 - 4.0 10*9/L    Absolute Monocytes 0.6 0.1 - 1.0 10*9/L    Absolute Eosinophils 0.3 0.0 - 0.7 10*9/L Absolute Basophils 0.1 0.0 - 0.1 10*9/L     *Note: Due to a large number of results and/or encounters for the requested time period, some results have not been displayed. A complete set of results can be found in Results Review.        Lab Results   Component Value Date    NA 138 04/01/2019    K 4.8 04/01/2019    CL 109 (H) 04/01/2019    CO2 19.0 (L) 04/01/2019    BUN 32 (H) 04/01/2019    CREATININE 4.92 (H) 04/01/2019    CREATININE 4.4 (H) 02/02/2019    CREATININE 3.9 (H) 01/26/2019    CREATININE 3.6 (H) 01/24/2019    CREATININE 3.73 (H) 01/20/2019    CREATININE 3.43 (H) 01/13/2019    GFR >= 60 01/09/2012    GFR >= 60 09/17/2011    GFR >= 60 09/06/2011    GFR >= 60 06/09/2011    GFR >= 60 05/12/2011    GFR >= 60 01/09/2011     Lab Results   Component Value Date    PCRATIOUR  1.669 03/17/2017    ALBUMIN 3.0 (L) 04/01/2019    WBC 9.4 04/01/2019    PLT 260 04/01/2019     No components found for: LASTPTH  Lab Results   Component Value Date    WBC 9.4 04/01/2019    HGB 11.8 (L) 04/01/2019    HCT 35.3 (L) 04/01/2019    PLT 260 04/01/2019     Wt Readings from Last 3 Encounters:   04/07/19 (!) 157 kg (346 lb 3.2 oz)   02/02/19 (!) 162.8 kg (358 lb 14.4 oz)   01/31/19 (!) 164.2 kg (362 lb)       IMAGING STUDIES:    ECHO 10/19/18  ?? Dilated left ventricle - mild  ?? Moderately decreased left ventricular systolic function, ejection fraction 35%  ?? Segmental wall motion abnormality - severe posterolateral hypokinesis  ?? Mitral regurgitation - mild  ?? Dilated left atrium - moderate  ?? Aortic sclerosis  ?? Normal right ventricular systolic function  ?? Technically difficult study due to chest wall/lung interference  ?? Ultrasound enhancing agent utilized to improve endocardial border definition  ?? Diastolic dysfunction - grade I (normal filling pressures)    ASSESSMENT/PLAN:  Jeremy Oliver is a 49 y.o. year old patient with   1. CKD (chronic kidney disease) stage 5, GFR less than 15 ml/min (CMS-[name]) 2. Type 2 diabetes mellitus with stage 4 chronic kidney disease, with long-term current use of insulin (CMS-[name])    3. Essential hypertension    4. OSA (obstructive sleep apnea)        1. CKD V (G5/A3). 2/2 longstanding and poorly controlled HTN and DM2. Most recent Cr at presentation to ER for chest tightness was elevated at 4.9. He is at high risk of kidney failure and progression to ESKD. We have discussed extensively. He has no acute indications today, but when he needs another cardiac intervention, will likely end up on dialysis  - Continue to avoid nephrotoxins, including NSAIDs  - Advised to try and decrease soda intake and prefer water, but fluid intake on him is challenging due to high risk of volume overload  - Prefer initiation of RRT with CVC if/when the time comes as the risk of vascular surgery is too great with current cardiac status. Could proceed with access placement once he is on dialysis and has cardiac status optimized. Pending housing status, he would be interested in HT one day, but not an option at this point.  - Agreeable to [facility] meeting. Will place referral.    2. HTN. BP elevated today. He does not take the hydralazine because it leads to hypotension. He has not taken his BP meds yet today.  - continue amlodipine 10, carvedilol 37.5 bid, imdur 30, bumex 2mg  bid  - elevated today, but will make no changes as he has not yet taken his BP meds    3. CKD-MBD. Will update Phos and PTH with next labs next week when he sees PCP/cardiology  - continue cholecalciferol 5000 U  - continue sevelamer 1600mg  w meals    4. Anemia. Hgb levels are ok, not requiring ESAs at this point    5. Electrolytes. K in range on last check, CO2 down to 19, but had been in range prior. Will await persistent acidosis before starting him on bicarb as I don't want to add any sodium if I can avoid it.    6. Prevention. On statin and zetia. 7. DM2. On levemir, novolog and ozempic and tolerating. Last hgba1c 7.9%  above goal.  - hgba1c with next labs    Jeremy Oliver will follow up in Return in about 3 months (around 07/06/2019) for Next scheduled follow up. or sooner as needed.     I personally spent 60 minutes face-to-face and non-face-to-face in the care of this patient, which includes all pre, intra, and post visit time on the date of service.

## 2019-04-07 NOTE — Unmapped (Signed)
I did not change medicines today.    I will be in touch with Drs Hulan Fess and Andrey Farmer about the plan for this next period moving forward.    I made referrals to the Sleep Disorders clinic and to my colleague in the [facility] clinic.

## 2019-04-08 LAB — ALBUMIN / CREATININE URINE RATIO: ALBUMIN/CREATININE RATIO: 7217.9 ug/mg — ABNORMAL HIGH (ref 0.0–30.0)

## 2019-04-08 LAB — CREATININE, URINE: Lab: 80.3

## 2019-04-12 ENCOUNTER — Ambulatory Visit: Admit: 2019-04-12 | Discharge: 2019-04-13 | Payer: MEDICARE

## 2019-04-12 DIAGNOSIS — F3342 Major depressive disorder, recurrent, in full remission: Principal | ICD-10-CM

## 2019-04-12 DIAGNOSIS — E119 Type 2 diabetes mellitus without complications: Principal | ICD-10-CM

## 2019-04-12 DIAGNOSIS — R1111 Vomiting without nausea: Principal | ICD-10-CM

## 2019-04-12 DIAGNOSIS — N184 Chronic kidney disease, stage 4 (severe): Principal | ICD-10-CM

## 2019-04-12 DIAGNOSIS — E1122 Type 2 diabetes mellitus with diabetic chronic kidney disease: Principal | ICD-10-CM

## 2019-04-12 DIAGNOSIS — I10 Essential (primary) hypertension: Principal | ICD-10-CM

## 2019-04-12 DIAGNOSIS — L97522 Non-pressure chronic ulcer of other part of left foot with fat layer exposed: Principal | ICD-10-CM

## 2019-04-12 DIAGNOSIS — I25118 Atherosclerotic heart disease of native coronary artery with other forms of angina pectoris: Principal | ICD-10-CM

## 2019-04-12 DIAGNOSIS — Z794 Long term (current) use of insulin: Secondary | ICD-10-CM

## 2019-04-12 DIAGNOSIS — I480 Paroxysmal atrial fibrillation: Principal | ICD-10-CM

## 2019-04-12 DIAGNOSIS — I5042 Chronic combined systolic (congestive) and diastolic (congestive) heart failure: Principal | ICD-10-CM

## 2019-04-12 MED ORDER — CIPROFLOXACIN 0.3 %-DEXAMETHASONE 0.1 % EAR DROPS,SUSPENSION
Freq: Two times a day (BID) | OTIC | 0 refills | 19 days | Status: CP
Start: 2019-04-12 — End: 2019-04-30
  Filled 2019-04-14: qty 7.5, 18d supply, fill #0

## 2019-04-12 MED ORDER — HYDRALAZINE 25 MG TABLET: 25 mg | tablet | Freq: Every day | 3 refills | 90 days | Status: AC

## 2019-04-12 MED ORDER — DICLOFENAC 1 % TOPICAL GEL
Freq: Three times a day (TID) | TOPICAL | 11 refills | 17 days | Status: CP
Start: 2019-04-12 — End: 2020-04-11
  Filled 2019-04-14: qty 100, 17d supply, fill #0

## 2019-04-12 MED ORDER — BUMETANIDE 2 MG TABLET
ORAL_TABLET | Freq: Two times a day (BID) | ORAL | 3 refills | 90.00000 days | Status: CP
Start: 2019-04-12 — End: 2020-04-11
  Filled 2019-04-14: qty 180, 90d supply, fill #0

## 2019-04-12 MED ORDER — HYDRALAZINE 25 MG TABLET
ORAL_TABLET | Freq: Every day | ORAL | 3 refills | 90 days | Status: CP
Start: 2019-04-12 — End: 2020-04-11
  Filled 2019-04-14: qty 90, 90d supply, fill #0

## 2019-04-12 NOTE — Unmapped (Signed)
[facility] - [facility]  Established Visit    Reason for Visit:  Diabetes, recurrent vomiting    1. Type 2 diabetes mellitus with stage 4 chronic kidney disease, with long-term current use of insulin (CMS-[name])    2. CKD (chronic kidney disease) stage 4, GFR 15-29 ml/min (CMS-[name])    3. Essential hypertension (RAF-[name])    4. Intractable vomiting without nausea, unspecified vomiting type    5. Ulcer of left foot with fat layer exposed (CMS-[name])    6. Chronic heart failure with reduced ejection fraction and diastolic dysfunction (CMS-[name])    7. Recurrent major depressive disorder, in full remission (CMS-[name])    8. Paroxysmal atrial fibrillation (CMS-[name])    9. Obesity, Class III, BMI 40-49.9 (morbid obesity) (CMS-[name])    10. Coronary artery disease of native artery of native heart with stable angina pectoris (CMS-[name])    11. Acute otitis externa of right ear, unspecified type        Assessment/Plans:  1. Type 2 diabetes mellitus with stage 4 chronic kidney disease, with long-term current use of insulin (CMS-[name])  Lab Results   Component Value Date    A1C 7.2 (H) 04/12/2019   no changes to regimen  Possibly stop ozempic on a short term basis in future to see if it resolves vomiting  - Telehealth Fundus Photography Screening - OU - Both Eyes    2. CKD (chronic kidney disease) stage 4, GFR 15-29 ml/min (CMS-[name])  Labs ordered.  We again discussed eventual need for dialysis.  I did recommend that he meet with Palliative Care as planned to help prepare him. We discussed again the notion that he will die once he starts dialysis.      3. Essential hypertension (RAF-[name])  Very uncontrolled. Stressed importance of this, however they are largely concerned with hypotension.   - continue amlodipine 10mg  daily, carvedilol 37.5mg  BID, Bumex 2mg  BID  - agreed to take hydralazine 25mg  daily with slow titration up as they associate prior hypotension with hydralazine  - f/u in 2 weeks for BP recheck 4. Intractable vomiting without nausea, unspecified vomiting type  Unclear etiology; NM gastric emptying study was normal.  Could be related to esophageal dysmotility given regurgitation of some undigested food.  Another alternative is Ozempic, however he and wife do not want to stop that medication even for a trial. Discussed if normal evaluation with GI, would recommend trial off ozempic.   - Gastroenterology; Future    5. prior Ulcer of left foot with fat layer exposed   Now heeled.  Encouraged to get diabetic shoes as ordered    6. Chronic heart failure with reduced ejection fraction and diastolic dysfunction (CMS-[name])  Relatively euvolemic but does report occasional orthopnea.  Encouraged continued use of Bumex and control of BP    7. Recurrent major depressive disorder, in full remission (CMS-[name])  PHQ-9 PHQ-9 TOTAL SCORE   02/16/2019 1   01/31/2019 2   12/20/2018 0   12/10/2018 2   11/12/2018 1   continue to monitor. Ultimately needs long-term counseling support but limited by insurance coverage and financial barriers.    8. Paroxysmal atrial fibrillation (CMS-[name])  Occasional palpitations.        9. Obesity, Class III, BMI 40-49.9 (morbid obesity) (CMS-[name])  Stable. Dietary changes limited due to social situation    10. Coronary artery disease of native artery of native heart with stable angina pectoris (CMS-[name])  Current stable but with known disease.  Currently with classic  stable anginal symptoms, however given his high risk for complication with LHC, likely continue with medical management.  Stressed importance of HTN and DM control as well as adherence to medications    11. Otitis media externa  - Ciprodex x 7 days      Return in about 4 weeks (around 05/10/2019).F2F  2 weeks with [facility]     Medication adherence and barriers to the treatment plan have been addressed. Opportunities to optimize healthy behaviors have been discussed. Patient / caregiver voiced understanding. __________________________________________________________    HPI:  Presents with wife    States he has had intermittent sharp pain in R ear for the past month.  Has used q-tip to get wax, but hasn't had any blood drainage. No ear fullness, fever.     Intermittent vomiting has reoccured.  Sometimes occurs just after drinking a glass of water. Sometimes undigested food. Doesn't always have nausea.  Recalls gastric emptying studying but no subsequent visit with GI. Wife has questions about if sx related to Ozempic, but when explained that it could be, both are averse to idea of stopping given good diabetes control.        ROS:  Pertinent items are noted in HPI.    Patient Active Problem List   Diagnosis   ??? Idiopathic chronic gout of left knee   ??? Hypercholesteremia   ??? Essential hypertension (RAF-[name])   ??? 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 Osteoarthritis   ??? Chronic nonalcoholic liver disease   ??? GERD (gastroesophageal reflux disease)   ??? Mild intermittent asthma without complication   ??? Chronic pain syndrome   ??? Paroxysmal atrial fibrillation (CMS-[name])   ??? OSA (obstructive sleep apnea)   ??? Tobacco use disorder   ??? Status post cardiac pacemaker procedure   ??? Diabetes mellitus with gastroparesis (CMS-[name])   ??? Cardiac pacemaker in situ   ??? Recurrent major depressive disorder, in full remission (CMS-[name])   ??? Adjustment disorder, unspecified   ??? Hyperkalemia   ??? CKD (chronic kidney disease) stage 4, GFR 15-29 ml/min (CMS-[name])   ??? Acquired left foot drop   ??? Decreased sensation of lower extremity, left   ??? Chronic anticoagulation   ??? Type 2 diabetes mellitus with stage 4 chronic kidney disease, with long-term current use of insulin (CMS-[name])   ??? Situational stress   ??? Ulcer of left foot with fat layer exposed (CMS-[name])   ??? Cough       Medications:  Reviewed in EPIC      Physical Exam: Vital Signs:   Ht 190.5 cm (6' 3)  - Wt (!) 158.3 kg (348 lb 14.4 oz)  - BMI 43.61 kg/m??   Gen: chronically ill appearing, NAD  HEENT:  R ear canal erythematous with cobblestone appearance.   CV: RRR, no murmurs  Pulm: CTA bilaterally, no crackles or wheezes  MSK: Trace LE edema

## 2019-04-12 NOTE — Unmapped (Signed)
Patient would like ear drops sent in to pharmacy.  Please let me know if I can do anything to help you.  Thank you.

## 2019-04-12 NOTE — Unmapped (Signed)
[facility] BPs  BP#1 167/108  p90   BP#2 163/99  p90  BP#3 163/109  p94    Average BP 164/104 p91  (please note this as a comment in vitals)

## 2019-04-12 NOTE — Unmapped (Addendum)
Take Hydralazine 25 mg once a day for the next 2 weeks and continue to check the blood pressure regularly. Let me know if you see low blood pressures.     Labs when you see Dr. Andrey Farmer.    Your A1c is great a 7.2%    Your vomiting could be related to a GI issue, the ozempic or your kidney disease.  I put in a referral to see the GI doctors.

## 2019-04-14 ENCOUNTER — Ambulatory Visit
Admit: 2019-04-14 | Discharge: 2019-04-15 | Payer: MEDICARE | Attending: Cardiovascular Disease | Primary: Cardiovascular Disease

## 2019-04-14 DIAGNOSIS — N185 Chronic kidney disease, stage 5: Principal | ICD-10-CM

## 2019-04-14 DIAGNOSIS — Z794 Long term (current) use of insulin: Principal | ICD-10-CM

## 2019-04-14 DIAGNOSIS — E1122 Type 2 diabetes mellitus with diabetic chronic kidney disease: Secondary | ICD-10-CM

## 2019-04-14 DIAGNOSIS — I25118 Atherosclerotic heart disease of native coronary artery with other forms of angina pectoris: Principal | ICD-10-CM

## 2019-04-14 DIAGNOSIS — I10 Essential (primary) hypertension: Principal | ICD-10-CM

## 2019-04-14 DIAGNOSIS — N184 Chronic kidney disease, stage 4 (severe): Principal | ICD-10-CM

## 2019-04-14 DIAGNOSIS — I5032 Chronic diastolic (congestive) heart failure: Principal | ICD-10-CM

## 2019-04-14 LAB — BASIC METABOLIC PANEL
ANION GAP: 4 mmol/L — ABNORMAL LOW (ref 7–15)
BLOOD UREA NITROGEN: 38 mg/dL — ABNORMAL HIGH (ref 7–21)
BUN / CREAT RATIO: 8
CALCIUM: 8.6 mg/dL (ref 8.5–10.2)
CO2: 27 mmol/L (ref 22.0–30.0)
CREATININE: 4.7 mg/dL — ABNORMAL HIGH (ref 0.70–1.30)
EGFR CKD-EPI AA MALE: 16 mL/min/{1.73_m2} — ABNORMAL LOW (ref >=60)
EGFR CKD-EPI NON-AA MALE: 13 mL/min/{1.73_m2} — ABNORMAL LOW (ref >=60)
POTASSIUM: 5 mmol/L (ref 3.5–5.0)
SODIUM: 135 mmol/L (ref 135–145)

## 2019-04-14 LAB — LIPID PANEL
CHOLESTEROL: 213 mg/dL — ABNORMAL HIGH (ref 100–199)
HDL CHOLESTEROL: 39 mg/dL — ABNORMAL LOW (ref 40–59)
NON-HDL CHOLESTEROL: 174 mg/dL
TRIGLYCERIDES: 185 mg/dL — ABNORMAL HIGH (ref 1–149)
VLDL CHOLESTEROL CAL: 37 mg/dL (ref 12–47)

## 2019-04-14 LAB — PRO-BNP: Natriuretic peptide.B prohormone N-Terminal:MCnc:Pt:Ser/Plas:Qn:: 4620 — ABNORMAL HIGH

## 2019-04-14 LAB — ALBUMIN: Albumin:MCnc:Pt:Ser/Plas:Qn:: 3.1 — ABNORMAL LOW

## 2019-04-14 LAB — HEMOGLOBIN A1C
ESTIMATED AVERAGE GLUCOSE: 160 mg/dL
Hemoglobin A1c/Hemoglobin.total:MFr:Pt:Bld:Qn:: 7.2 — ABNORMAL HIGH

## 2019-04-14 LAB — VITAMIN B-12: Cobalamins:MCnc:Pt:Ser/Plas:Qn:: 272

## 2019-04-14 LAB — CHOLESTEROL/HDL RATIO SCREEN: Lab: 5.5 — ABNORMAL HIGH

## 2019-04-14 LAB — ANION GAP: Anion gap 3:SCnc:Pt:Ser/Plas:Qn:: 4 — ABNORMAL LOW

## 2019-04-14 LAB — CALCIUM: Calcium:MCnc:Pt:Ser/Plas:Qn:: 8.6

## 2019-04-14 LAB — PHOSPHORUS: Phosphate:MCnc:Pt:Ser/Plas:Qn:: 4

## 2019-04-14 MED FILL — CIPRODEX 0.3 %-0.1 % EAR DROPS,SUSPENSION: 18 days supply | Qty: 8 | Fill #0 | Status: AC

## 2019-04-14 MED FILL — SPS (WITH SORBITOL) 15 GRAM-20 GRAM/60 ML ORAL SUSPENSION: 60 days supply | Qty: 473 | Fill #1 | Status: AC

## 2019-04-14 MED FILL — HYDRALAZINE 25 MG TABLET: 90 days supply | Qty: 90 | Fill #0 | Status: AC

## 2019-04-14 MED FILL — DICLOFENAC 1 % TOPICAL GEL: 17 days supply | Qty: 100 | Fill #0 | Status: AC

## 2019-04-14 MED FILL — SPS (WITH SORBITOL) 15 GRAM-20 GRAM/60 ML ORAL SUSPENSION: ORAL | 60 days supply | Qty: 473 | Fill #1

## 2019-04-14 MED FILL — NITROGLYCERIN 0.4 MG SUBLINGUAL TABLET: 20 days supply | Qty: 100 | Fill #3 | Status: AC

## 2019-04-14 MED FILL — BUMETANIDE 2 MG TABLET: 90 days supply | Qty: 180 | Fill #0 | Status: AC

## 2019-04-14 MED FILL — NITROGLYCERIN 0.4 MG SUBLINGUAL TABLET: SUBLINGUAL | 20 days supply | Qty: 100 | Fill #3

## 2019-04-14 NOTE — Unmapped (Signed)
-   Continue current medical regimen   - We will obtain labs today

## 2019-04-14 NOTE — Unmapped (Signed)
PCP:  Kurtis Bushman, MD    Patient ID: Jeremy Oliver is a 49 y.o. male.    Assessment and Plan:   1. Coronary artery disease involving native coronary artery of native heart without angina pectoris  2. Chronic heart failure with reduced ejection fraction and diastolic dysfunction (CMS-[name])  3. Paroxysmal atrial fibrillation (CMS-[name])  4. Essential hypertension (RAF-[name])  5. CKD 5    Jeremy Oliver is doing better today, and there is no evidence of decompensated heart failure on examination.  His blood pressure is reasonably well controlled and his weight is stable.  He does have severe diffuse multivessel coronary disease with some additional targets for revascularization in the LAD and left circumflex artery.  Unfortunately he is at very high risk for contrast nephropathy and we should only consider repeat revascularization if he is having refractory angina or unstable symptoms.    I encouraged him to continue daily weight monitoring and regular blood pressure checks as well.  I also highly recommend he receive the COVID vaccine as soon as it is available or him.       Return in about 3 months (around 07/13/2019).    Dictated using Animal nutritionist, please excuse typos    Patient seen and discussed with Dr. Andrey Farmer.     Winn Jock, MD  Interventional Cardiology Fellow  04/14/2019      Subjective:      Jeremy Oliver return to clinic today.  He was recently admitted to the hospital again for acute heart failure and acute coronary syndrome and a repeat diagnostic cath showed severe stenosis of the right coronary artery.  Another stent was placed in his distal right coronary artery and he was discharged home.  He returned to [facility] with worsening renal insufficiency and was hospitalized briefly last week.     He denies any chest pain, palpitations, syncope, falls or injuries.  He does have chronic dyspnea and currently has NYHA functional class II-III symptoms.  He is limited by left leg pain as well as a result of previous nerve injury during ECMO cannulation.    Interval hx 04/14/2019: Jeremy Oliver recently saw nephrology who did not think there was an urgent indication for iHD. He is planning on meeting with the dialysis coordinators soon. He currently reports intermittent post prandial emesis and some fatigue. He denies any exertional chest pain or exertional symptoms. No leg swelling though he does report some orthopnea and PND. The patient denies any recent  leg swelling, changes in appetite, palpitations and dizzy spells.      Patient Active Problem List   Diagnosis   ??? Idiopathic chronic gout of left knee   ??? Hypercholesteremia   ??? Essential hypertension (RAF-[name])   ??? 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 Osteoarthritis   ??? Chronic nonalcoholic liver disease   ??? GERD (gastroesophageal reflux disease)   ??? Mild intermittent asthma without complication   ??? Chronic pain syndrome   ??? Paroxysmal atrial fibrillation (CMS-[name])   ??? OSA (obstructive sleep apnea)   ??? Tobacco use disorder   ??? Status post cardiac pacemaker procedure   ??? Diabetes mellitus with gastroparesis (CMS-[name])   ??? Cardiac pacemaker in situ   ??? Recurrent major depressive disorder, in full remission (CMS-[name])   ??? Adjustment disorder, unspecified   ??? Hyperkalemia   ??? CKD (chronic kidney disease) stage 4, GFR 15-29 ml/min (CMS-[name])   ???  Acquired left foot drop   ??? Decreased sensation of lower extremity, left   ??? Chronic anticoagulation   ??? Type 2 diabetes mellitus with stage 4 chronic kidney disease, with long-term current use of insulin (CMS-[name])   ??? Situational stress   ??? Cough       Current Outpatient Medications   Medication Sig Dispense Refill   ??? acetaminophen (TYLENOL) 500 MG tablet TAKE 2 TABLETS (1 000MG ) BY MOUTH EVERY 8 HOURS AS NEEDED FOR PAIN 180 each 3   ??? albuterol HFA 90 mcg/actuation inhaler Inhale 2 puffs into the lungs every 6 (six) hours as needed for wheezing or shortness of breath. 18 g 2   ??? alirocumab (PRALUENT) 150 mg/mL subcutaneous injection Inject 1 mL (150 mg total) under the skin every fourteen (14) days. 4 Syringe 11   ??? allopurinoL (ZYLOPRIM) 100 MG tablet Take 1.5 tablets (150 mg total) by mouth daily. 60 tablet 3   ??? amiodarone (PACERONE) 200 MG tablet TAKE 1 TABLET BY MOUTH ONCE DAILY 90 tablet 3   ??? amLODIPine (NORVASC) 10 MG tablet Take 1 tablet (10 mg total) by mouth daily. 90 tablet 3   ??? atorvastatin (LIPITOR) 80 MG tablet Take 1 tablet (80 mg total) by mouth daily. 90 tablet 3   ??? blood sugar diagnostic Strp Disp to match current ins approved meter. Use to test blood sugar four times daily. E11.22, Z79.4 400 each 3   ??? blood-glucose meter kit Disp. blood glucose meter kit preferred by patient's insurance. Dx: Diabetes, E11.9 1 each 11   ??? bumetanide (BUMEX) 2 MG tablet Take 1 tablet (2 mg total) by mouth two (2) times a day. 180 tablet 3   ??? carvediloL (COREG) 25 MG tablet Take 1.5 tablets (37.5 mg total) by mouth Two (2) times a day. 270 tablet 3   ??? cholecalciferol, vitamin D3, 5,000 unit capsule Take 1 capsule (5,000 Units total) by mouth daily. 100 capsule 0   ??? ciprofloxacin-dexamethasone (CIPRODEX) 0.3-0.1 % otic suspension Administer 4 drops to the right ear Two (2) times a day for 7 days. 7.5 mL 0   ??? diclofenac sodium (VOLTAREN) 1 % gel Apply 2 grams topically Three (3) times a day. 100 g 11   ??? empty container Misc Use as directed to dispose of injectable medications 1 each 3   ??? ezetimibe (ZETIA) 10 mg tablet TAKE 1 TABLET BY MOUTH ONCE DAILY 90 each 3   ??? FLUoxetine (PROZAC) 40 MG capsule Take 2 capsules (80 mg total) by mouth daily. 180 capsule 3   ??? gabapentin (NEURONTIN) 300 MG capsule Take 1 capsule (300 mg total) by mouth two (2) times a day. 180 capsule 3   ??? hydrALAZINE (APRESOLINE) 25 MG tablet Take 1 tablet (25 mg total) by mouth daily. 90 tablet 3   ??? insulin ASPART (NOVOLOG FLEXPEN U-100 INSULIN) 100 unit/mL (3 mL) injection pen Inject 20 units under the skin three (3) times a day with a meal. 15 mL 11   ??? insulin detemir U-100 (LEVEMIR) 100 unit/mL (3 mL) injection pen Inject 40 Units total under the skin nightly. 15 mL 3   ??? isosorbide mononitrate (IMDUR) 30 MG 24 hr tablet Take 4 tablets (120 mg total) by mouth daily. 120 tablet 3   ??? loperamide (IMODIUM) 2 mg capsule Take 1 capsule (2 mg total) by mouth 4 (four) times a day as needed for diarrhea. 30 capsule 0   ??? nitroglycerin (NITROSTAT) 0.4 MG SL tablet Place  1 tablet (0.4 mg total) under the tongue every five (5) minutes as needed for chest pain. 100 each 3   ??? omeprazole (PRILOSEC) 40 MG capsule Take 1 capsule (40 mg total) by mouth Two (2) times a day (30 minutes before a meal). 180 capsule 3   ??? ondansetron (ZOFRAN) 4 MG tablet Take 1 tablet (4 mg total) by mouth every eight (8) hours as needed for nausea for up to 4 days. 10 tablet 0   ??? rivaroxaban (XARELTO) 15 mg Tab Take 1 tablet (15 mg total) by mouth daily with evening meal. 90 tablet 3   ??? semaglutide 0.25 mg or 0.5 mg(2 mg/1.5 mL) PnIj Inject 0.5 mg under the skin every seven (7) days. 4.5 mL 3   ??? sevelamer (RENVELA) 800 mg tablet TAKE 2 TABLETS (1600MG ) BY MOUTH THREE TIMES DAILY WITH MEALS 540 tablet 3   ??? sodium polystyrene, SPS, with sorbitol (SPS, WITH SORBITOL,) 15-20 gram/60 mL Susp Take 60 mL (15 g) by mouth once a week. 473 mL 3   ??? ticagrelor (BRILINTA) 90 mg Tab TAKE 1 TABLET BY MOUTH TWICE DAILY 180 tablet 3   ??? arm brace Misc 1 each by Miscellaneous route daily. (Patient not taking: Reported on 04/12/2019) 1 each 0   ??? insulin syringe-needle U-100 0.3 mL 31 gauge x 5/16 Syrg Use to inject insulin twice daily with meals 60 each 0   ??? lancets Misc Disp. Ins preferred lancets. Use to test blood sugar four times daily. E11.22, Z79.4 400 each 3   ??? miscellaneous medical supply (BLOOD PRESSURE CUFF) Misc Measure BP once a day or if any symptoms 1 each 0   ??? ondansetron (ZOFRAN) 4 MG tablet Take 1 tablet (4 mg total) by mouth every eight (8) hours as needed for nausea. (Patient not taking: Reported on 04/14/2019) 30 tablet 0   ??? UNIFINE PENTIPS 31 gauge x 5/16 (8 mm) Ndle USE WITH INSULIN AND VICTOZA 5 TIMES DAILY 400 each 3     No current facility-administered medications for this visit.        Allergies   Allergen Reactions   ??? Losartan      Hyperkalemia (K>6)       Review of Systems  Pertinent items are noted in HPI.      Objective:      Vitals:    04/14/19 0913   BP: 140/96   Pulse: 90   Resp: 22   Temp: 35.9 ??C   SpO2: 96%   Body mass index is 43.82 kg/m??.    Wt Readings from Last 6 Encounters:   04/14/19 (!) 159 kg (350 lb 9.6 oz)   04/12/19 (!) 158.3 kg (348 lb 14.4 oz)   04/07/19 (!) 157 kg (346 lb 3.2 oz)   02/02/19 (!) 162.8 kg (358 lb 14.4 oz)   01/31/19 (!) 164.2 kg (362 lb)   01/26/19 (!) 160.6 kg (354 lb)     NAD  RRR, s1s2   CTA B   Abd benign  EXT no edema  Neuro grossly normal  Psych grossly normal    Please excuse typos, dictation completed via Sales executive software      _____________________________________________________________

## 2019-04-20 DIAGNOSIS — E875 Hyperkalemia: Principal | ICD-10-CM

## 2019-04-20 DIAGNOSIS — F3342 Major depressive disorder, recurrent, in full remission: Principal | ICD-10-CM

## 2019-04-20 DIAGNOSIS — J4520 Mild intermittent asthma, uncomplicated: Principal | ICD-10-CM

## 2019-04-20 NOTE — Unmapped (Signed)
Please let her know that 114/45 is a good blood pressure and hydralazine is likely helping to keep it there.  Goal BP should be < 130/90 so the SBP 140-160 are too high.  I had only advised that they take hydralazine once a day due to their fear of hypotension and I was going to slowly increase over time.  How often is he taking hydralazine now?    I was the supervising physician in the delivery of the service. Kurtis Bushman, MD

## 2019-04-20 NOTE — Unmapped (Addendum)
CCM Follow-up Call    PCP Action:please advise, pt's wife asking for call re: hydralazine parameters and recent lab results      Pt's wife, [name] calls in requesting to discuss recent lab results.  [name] also states pt's BP 114/45, 89 this morning so she held the hydralazine.  States BP is usually 140-160/100.  Pt is taking all other prescribed medications.  Prn:  immodium 4 days ago, zofran almost daily, and is effective for nausea.  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    [name] denies need for appt w/ provider.  Recently had appt w/ cardiology.   Has GI follow up and PCP follow up appts scheduled.  No c/o cp today.  NAD noted.    Will notify provider re: phone call request vrs telehealth appt

## 2019-04-20 NOTE — Unmapped (Signed)
Error - duplicate

## 2019-04-21 NOTE — Unmapped (Signed)
I would recommend holding only if SBP < 100 or DBP < 60.

## 2019-04-21 NOTE — Unmapped (Signed)
Left vm re:     Hold hydralazine in the morning for     SBP <100 or DBP <60.

## 2019-04-25 NOTE — Unmapped (Signed)
Pre call made but message said call cannot be completed at this time and no way to leave a message.

## 2019-04-26 NOTE — Unmapped (Deleted)
[facility] ([facility])      [facility] Return Visit Note                    Initial Referring Provider:  Roma Schanz, MD  [address]  [place] [address] [place],  [place] [address]    Primary Care Provider:  Kurtis Bushman, MD    Patient Care Team:  Jeremy Schanz, MD as PCP - General (Internal Medicine)  Jeremy Schanz, MD as PCP - Rande Brunt (Internal Medicine)  Jeremy Cotton, PA as Physician Assistant (Internal Medicine)  Jeremy Latus, LCSW as Social Worker (Internal Medicine)  Jeremy Oliver as Nurse Practitioner (Cardiology)  Jeremy Bachelor, MD as Consulting Physician (Cardiology)  Jeremy Hind, MD as Referring Physician (Cardiology)  Jeremy Gold, RN as Case Manager      Jeremy Oliver is a 49 y.o. male (DOB: 01/09/1971) who returns for diabetic gastroparesis and esophagitis with nausea and vomiting.     ASSESSMENT:    This is a 50 y.o. year old male with esophagitis and uncontrolled DM w/gastroparesis who presents with a several month history of intermittent nausea, vomiting of undigested food from 12+hours prior, weight loss of 5 lbs.  Has complicated medical history including CKD, recent NSTEMI and is a very poor candidate for repeat left heart cath given his advanced CKD and likelihood??of progression to dialysis.??Patient strongly states he would not like to go through dialysis.  This makes his surgical candidacy for antireflux surgery even less likely.    ??        PLAN:    1.    2.    3.    4.  Follow up in ***.    5.  *** will return to local GI for all other GI care.    6.  My contact information was provided and *** will call with any questions or concerns in the interim.          The patient was not located and I was not located within 250 yards of a hospital based location during the real-time audio and video visit.     {Telehealth/Video Visit Attestations:[id number]}          Reason for Visit:    Problem List Items Addressed This Visit     None            CHIEF COMPLAINT: ***    HISTORY OF PRESENT ILLNESS: This is a 49 y.o. year old male presenting to the [place] [facility] [place] [facility] clinic today in follow up for DM gastroparesis and GERD.  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     Jeremy Oliver visit is conducted by *** today due to COVID-19.  *** states *** is home with ***/  by ***self and agrees to proceed with this visit.     Jeremy Oliver was last evaluated in clinic on 10/12/2018 and presents today in follow up.     DIAGNOSTIC STUDIES:  I have reviewed and summarized previous medical records which illustrated:     GI Procedures:    EGD 05/07/2016:   - LA Grade A reflux esophagitis.  - Gastritis.  - Erythematous duodenopathy.  - Normal second portion of the duodenum.  - No specimens collected.  ??  EGD 02/28/2016:   - LA Grade C reflux esophagitis.  - Tortuous esophagus.  - Erythematous mucosa in the antrum. Biopsied.  - Erythematous duodenopathy.  ??  A: Stomach, biopsy - Antral-type mucosa with changes consistent with reactive gastropathy  - Histologically unremarkable oxyntic-type mucosa   - No Helicobacter organisms identified by H&E stain  - No intestinal metaplasia or dysplasia identified     Radiographic studies:    Ecg 12 Lead (adult)    Result Date: 04/01/2019  NORMAL SINUS RHYTHM LEFT VENTRICULAR HYPERTROPHY WITH REPOLARIZATION ABNORMALITY WHEN COMPARED WITH ECG OF 01-Apr-2019 01:16, CHEST LEAD PLACEMENT CHANGE IS SUSPECTED OTHERWISE NO SIGNIFICANT CHANGE WAS FOUND Confirmed by Schuyler Amor ([id number]) on 04/01/2019 9:59:46 AM    Ecg 12 Lead    Result Date: 04/01/2019  SINUS RHYTHM WITH 1ST DEGREE AV BLOCK LEFT VENTRICULAR HYPERTROPHY WITH REPOLARIZATION ABNORMALITY WHEN COMPARED WITH ECG OF 13-Jan-2019 14:15, T WAVE INVERSION MORE EVIDENT IN ANTEROLATERAL LEADS Confirmed by Schuyler Amor [phone]) on 04/01/2019 9:57:47 AM    Xr Chest Portable    Result Date: 04/01/2019  EXAM: XR CHEST PORTABLE DATE: 04/01/2019 1:46 AM ACCESSION: [id number] UN DICTATED: 04/01/2019 2:19 AM INTERPRETATION LOCATION: Main [facility]     CLINICAL INDICATION: 49 years old Male with CHEST PAIN      COMPARISON: Chest radiograph 01/13/2019     TECHNIQUE: Portable Chest Radiograph.     FINDINGS:     Unchanged left chest wall pacer with leads in place.     Low lung volumes. No focal consolidation.     No pleural effusion or pneumothorax.     Stable enlarged cardiomediastinal silhouette.     Similar appearance of multiple remote left-sided rib fractures.                 No acute airspace disease.    Telehealth Fundus Photography Screening - Ou - Both Eyes    Result Date: 04/19/2019  Laterality: both eyes.     Right Eye Impression: diabetes with moderate non-proliferative retinopathy.     Left Eye Impression: diabetes with moderate non-proliferative retinopathy.     General Details Management plan: follow up with retinal photos in 3 months.       Laboratory results    ***    REVIEW OF SYSTEMS:    Pertinent positives and negatives are documented as per HPI; all other systems reviewed and negative.      CURRENT MEDICATIONS:    Current Outpatient Medications   Medication Sig Dispense Refill   ??? acetaminophen (TYLENOL) 500 MG tablet TAKE 2 TABLETS (1 000MG ) BY MOUTH EVERY 8 HOURS AS NEEDED FOR PAIN 180 each 3   ??? albuterol HFA 90 mcg/actuation inhaler Inhale 2 puffs into the lungs every 6 (six) hours as needed for wheezing or shortness of breath. 18 g 2   ??? alirocumab (PRALUENT) 150 mg/mL subcutaneous injection Inject 1 mL (150 mg total) under the skin every fourteen (14) days. 4 Syringe 11   ??? allopurinoL (ZYLOPRIM) 100 MG  tablet Take 1.5 tablets (150 mg total) by mouth daily. 60 tablet 3   ??? amiodarone (PACERONE) 200 MG tablet TAKE 1 TABLET BY MOUTH ONCE DAILY 90 tablet 3   ??? amLODIPine (NORVASC) 10 MG tablet Take 1 tablet (10 mg total) by mouth daily. 90 tablet 3   ??? atorvastatin (LIPITOR) 80 MG tablet Take 1 tablet (80 mg total) by mouth daily. 90 tablet 3   ??? blood sugar diagnostic Strp Disp to match current ins approved meter. Use to test blood sugar four times daily. E11.22, Z79.4 400 each 3   ??? blood-glucose meter kit Disp. blood glucose meter kit preferred by patient's insurance. Dx: Diabetes, E11.9 1 each 11   ??? bumetanide (BUMEX) 2 MG tablet Take 1 tablet (2 mg total) by mouth two (2) times a day. 180 tablet 3   ??? carvediloL (COREG) 25 MG tablet Take 1.5 tablets (37.5 mg total) by mouth Two (2) times a day. 270 tablet 3   ??? cholecalciferol, vitamin D3, 5,000 unit capsule Take 1 capsule (5,000 Units total) by mouth daily. 100 capsule 0   ??? ciprofloxacin-dexamethasone (CIPRODEX) 0.3-0.1 % otic suspension Administer 4 drops to the right ear Two (2) times a day for 7 days. 7.5 mL 0   ??? diclofenac sodium (VOLTAREN) 1 % gel Apply 2 grams topically Three (3) times a day. 100 g 11   ??? empty container Misc Use as directed to dispose of injectable medications 1 each 3   ??? ezetimibe (ZETIA) 10 mg tablet TAKE 1 TABLET BY MOUTH ONCE DAILY 90 each 3   ??? FLUoxetine (PROZAC) 40 MG capsule Take 2 capsules (80 mg total) by mouth daily. 180 capsule 3   ??? gabapentin (NEURONTIN) 300 MG capsule Take 1 capsule (300 mg total) by mouth two (2) times a day. 180 capsule 3   ??? hydrALAZINE (APRESOLINE) 25 MG tablet Take 1 tablet (25 mg total) by mouth daily. 90 tablet 3   ??? insulin ASPART (NOVOLOG FLEXPEN U-100 INSULIN) 100 unit/mL (3 mL) injection pen Inject 20 units under the skin three (3) times a day with a meal. 15 mL 11   ??? insulin detemir U-100 (LEVEMIR) 100 unit/mL (3 mL) injection pen Inject 40 Units total under the skin nightly. 15 mL 3   ??? insulin syringe-needle U-100 0.3 mL 31 gauge x 5/16 Syrg Use to inject insulin twice daily with meals 60 each 0   ??? isosorbide mononitrate (IMDUR) 30 MG 24 hr tablet Take 4 tablets (120 mg total) by mouth daily. 120 tablet 3   ??? lancets Misc Disp. Ins preferred lancets. Use to test blood sugar four times daily. E11.22, Z79.4 400 each 3   ??? loperamide (IMODIUM) 2 mg capsule Take 1 capsule (2 mg total) by mouth 4 (four) times a day as needed for diarrhea. 30 capsule 0   ??? miscellaneous medical supply (BLOOD PRESSURE CUFF) Misc Measure BP once a day or if any symptoms 1 each 0   ??? nitroglycerin (NITROSTAT) 0.4 MG SL tablet Place 1 tablet (0.4 mg total) under the tongue every five (5) minutes as needed for chest pain. 100 each 3   ??? omeprazole (PRILOSEC) 40 MG capsule Take 1 capsule (40 mg total) by mouth Two (2) times a day (30 minutes before a meal). 180 capsule 3   ??? ondansetron (ZOFRAN) 4 MG tablet Take 1 tablet (4 mg total) by mouth every eight (8) hours as needed for nausea for up to 4 days. 10 tablet  0   ??? ondansetron (ZOFRAN) 4 MG tablet Take 1 tablet (4 mg total) by mouth every eight (8) hours as needed for nausea. (Patient not taking: Reported on 04/14/2019) 30 tablet 0   ??? rivaroxaban (XARELTO) 15 mg Tab Take 1 tablet (15 mg total) by mouth daily with evening meal. 90 tablet 3   ??? semaglutide 0.25 mg or 0.5 mg(2 mg/1.5 mL) PnIj Inject 0.5 mg under the skin every seven (7) days. 4.5 mL 3   ??? sevelamer (RENVELA) 800 mg tablet TAKE 2 TABLETS (1600MG ) BY MOUTH THREE TIMES DAILY WITH MEALS 540 tablet 3   ??? sodium polystyrene, SPS, with sorbitol (SPS, WITH SORBITOL,) 15-20 gram/60 mL Susp Take 60 mL (15 g) by mouth once a week. 473 mL 3   ??? ticagrelor (BRILINTA) 90 mg Tab TAKE 1 TABLET BY MOUTH TWICE DAILY 180 tablet 3   ??? UNIFINE PENTIPS 31 gauge x 5/16 (8 mm) Ndle USE WITH INSULIN AND VICTOZA 5 TIMES DAILY 400 each 3     No current facility-administered medications for this visit.      (Not in a hospital admission)             ALLERGIES:    Allergies   Allergen Reactions   ??? Losartan      Hyperkalemia (K>6)         VITAL SIGNS:    There were no vitals taken for this visit.    PHYSICAL EXAM:  Physical exam is not performed today due to telehealth visit.

## 2019-04-26 NOTE — Unmapped (Signed)
This patient was scheduled for a return visit with me today in the [facility] clinic.  Despite 2 video texts and 1 phone call, I have been unable to reach them.  I will have our scheduling team reach out and get the appointment rescheduled.

## 2019-04-29 MED FILL — VENTOLIN HFA 90 MCG/ACTUATION AEROSOL INHALER: 25 days supply | Qty: 18 | Fill #1 | Status: AC

## 2019-04-29 MED FILL — RENVELA 800 MG TABLET: 90 days supply | Qty: 540 | Fill #1

## 2019-04-29 MED FILL — AMLODIPINE 10 MG TABLET: 90 days supply | Qty: 90 | Fill #1 | Status: AC

## 2019-04-29 MED FILL — OMEPRAZOLE 40 MG CAPSULE,DELAYED RELEASE: ORAL | 90 days supply | Qty: 180 | Fill #1

## 2019-04-29 MED FILL — ACETAMINOPHEN 500 MG TABLET: 30 days supply | Qty: 180 | Fill #1 | Status: AC

## 2019-04-29 MED FILL — NOVOLOG FLEXPEN U-100 INSULIN ASPART 100 UNIT/ML (3 ML) SUBCUTANEOUS: 25 days supply | Qty: 15 | Fill #4 | Status: AC

## 2019-04-29 MED FILL — RENVELA 800 MG TABLET: 90 days supply | Qty: 540 | Fill #1 | Status: AC

## 2019-04-29 MED FILL — DICLOFENAC 1 % TOPICAL GEL: TOPICAL | 17 days supply | Qty: 100 | Fill #1

## 2019-04-29 MED FILL — VENTOLIN HFA 90 MCG/ACTUATION AEROSOL INHALER: RESPIRATORY_TRACT | 25 days supply | Qty: 18 | Fill #1

## 2019-04-29 MED FILL — OMEPRAZOLE 40 MG CAPSULE,DELAYED RELEASE: 90 days supply | Qty: 180 | Fill #1 | Status: AC

## 2019-04-29 MED FILL — LEVEMIR FLEXTOUCH U-100 INSULIN 100 UNIT/ML (3 ML) SUBCUTANEOUS PEN: SUBCUTANEOUS | 37 days supply | Qty: 15 | Fill #3

## 2019-04-29 MED FILL — NOVOLOG FLEXPEN U-100 INSULIN ASPART 100 UNIT/ML (3 ML) SUBCUTANEOUS: SUBCUTANEOUS | 25 days supply | Qty: 15 | Fill #4

## 2019-04-29 MED FILL — ISOSORBIDE MONONITRATE ER 30 MG TABLET,EXTENDED RELEASE 24 HR: ORAL | 30 days supply | Qty: 120 | Fill #3

## 2019-04-29 MED FILL — DICLOFENAC 1 % TOPICAL GEL: 17 days supply | Qty: 100 | Fill #1 | Status: AC

## 2019-04-29 MED FILL — AMLODIPINE 10 MG TABLET: ORAL | 90 days supply | Qty: 90 | Fill #1

## 2019-04-29 MED FILL — ACETAMINOPHEN 500 MG TABLET: 30 days supply | Qty: 180 | Fill #1

## 2019-04-29 MED FILL — LEVEMIR FLEXTOUCH U-100 INSULIN 100 UNIT/ML (3 ML) SUBCUTANEOUS PEN: 37 days supply | Qty: 15 | Fill #3 | Status: AC

## 2019-04-29 MED FILL — ISOSORBIDE MONONITRATE ER 30 MG TABLET,EXTENDED RELEASE 24 HR: 30 days supply | Qty: 120 | Fill #3 | Status: AC

## 2019-05-08 NOTE — Unmapped (Addendum)
CLINIC NOTE   Sleep Clinic, Neurology  [facility]      NEW CONSULT/ EVALUATION  Date of Service: 05/10/2019  Patient Name: Jeremy Oliver     MRN: [id number]      Date of Birth: 02-11-1971  Primary Care Physician: Kurtis Bushman, MD   Referring Provider: Mila Merry, Md  [address]  [id number]  [place],  [place] [address]    Chief Complaint: Sleep apnea  Referral: OSA  History obtained from anyone other than the patient: Chart review  ASSESSMENT AND PLAN:   Impression:   Jeremy Route Ellery is a 49 y.o. male with history of coronary artery disease, hypertension, paroxysmal atrial fibrillation, heart failure, type 2 diabetes and obstructive sleep apnea treated with CPAP therapy seen in the sleep clinic for following issues.    History of severe obstructive sleep apnea???poor compliance to CPAP in the past.  Has not worn CPAP machine in last 8 years.  Discussed increased risk of heart attack and strokes from untreated sleep apnea.  He wants to get better and wants to get back to [place] wearing the CPAP machine.  He needs a new study.    Plan:    ?? Pap titration study ordered and arranged for next week with an AHI cutoff of 10  ?? Recommended sleeping on the side while waiting for the sleep study  ?? Follow-up in 2 months      The patient was seen and discussed with the attending physician, Dr. Dan Humphreys, who agrees with above management and plan.       ATTESTATION NOTE:  I saw and evaluated the patient, participating in the key portions of the service.  I reviewed the resident???s note and agree with the resident???s findings and plan as documented in their note.  Nilsa Nutting, MD         *Patient note was created using Dragon Dictation.  Any errors in syntax or even information Garoutte not have been identified and edited on initial review prior to signing this note.  SUBJECTIVE:   History of Present Illness:    Jeremy Oliver is a 49 y.o. male seen for initial consultation at the request of Mila Merry for an opinion regarding potential etiologies, diagnostic work-up that should be considered, and/or recommendations for treatment options for obstructive sleep apnea.     [name] has a history of severe obstructive sleep apnea diagnosed approximately 15 years ago.  He wore CPAP for approximately 5 years but has not been wearing the CPAP for last 8 years.  The reason why he states that he is not wearing the CPAP machine is because of the lack of supplies and the company who provided the supplies when out of business long time ago.    He states that since he stopped using the CPAP machine, he has had a heart attack, got all life support for 2 months, 8 stents in his heart, defibrillator placement and stage III chronic kidney disease. He also has had 3 TIA's.  He is being asked to go on hemodialysis.    He is currently in a complicated social situation where he is living in his small place provided by his nephew.    Last Pap titration study on 12/2014 recommended tapering off all opiate medications, CPAP pressure of 16 with EPR 3 only in lateral position, avoiding appliance sleep and aggressive weight loss.    His current symptoms include significant snoring worse  in supine position, daytime sleepiness and tiredness.  He goes to bed at 1 or 2 AM with short sleep latency but sometimes if he watches TV, it could last up to 1 hour.  Once he is asleep, is able to stay in bed but wakes up early at 5 AM.  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    No RLS or narcolepsy symptoms.  Epworth score is 13.     12 system review was completed and found to be negative except for what is mentioned above and in the HPI.    PAST MEDICAL HISTORY:     Past Medical History:   Diagnosis Date   ??? Angina at rest (CMS-[name])    ??? Arthritis    ??? Asthma    ??? Atrial fibrillation and flutter (CMS-[name]) 06/08/14   ??? BMI 40.0-44.9, adult (CMS-[name]) 10/29/2015   ??? Chronic anticoagulation 02/17/2018   ??? Chronic kidney disease, stage 3    ??? Chronic pain syndrome 04/11/2014    ~Use daily lyrica as recommended ~Tramadol as needed for pain ~Fluoxetine daily as recommended ~Daily exercise ~Eat a healthy diet ~Good control of your blood sugars can help    ??? Coronary artery disease    ??? Depression    ??? Diabetes mellitus (CMS-[name])    ??? Eye trauma 1998    glass in both eyes and removed   ??? GERD (gastroesophageal reflux disease)    ??? Gout    ??? Homeless    ??? Hyperlipidemia    ??? Hypertension    ??? Illiteracy and low-level literacy    ??? Microscopic hematuria    ??? Migraine    ??? Nephrolithiasis    ??? 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 Nonalcoholic fatty liver disease    ??? NSTEMI (non-ST elevated myocardial infarction) (CMS-[name]) 01/13/2017   ??? Obesity    ??? Obstructive sleep apnea 2009   ??? Panic attacks    ??? 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 Seizure-like activity (CMS-[name])     ~Monitor for symptoms and report them to your primary care provider if they occur    ??? Systolic CHF, chronic (CMS-[name])     EF 40-45% 2017   ??? TIA (transient ischemic attack)      PAST SURGICAL HISTORY:     Past Surgical History:   Procedure Laterality Date   ??? CARDIAC CATHETERIZATION  03/08/2007   ??? CORONARY STENT PLACEMENT     ??? CYSTOURETHROSCOPY  12/31/2009     Microscopic hematuria   ??? KNEE SURGERY Right 09/23/2006    arthroscopic knee surgery medial and lateral meniscus tears as well as crystal disease   ??? PR CATH PLACE/CORON ANGIO, IMG SUPER/INTERP,R&L HRT CATH, L HRT VENTRIC N/A 02/13/2017    Procedure: Left/Right Heart Catheterization;  Surgeon: Marlaine Hind, MD;  Location: [facility] CATH;  Service: Cardiology   ??? PR CATH PLACE/CORON ANGIO, IMG SUPER/INTERP,W LEFT HEART VENTRICULOGRAPHY N/A 03/17/2017    Procedure: Left Heart Catheterization W Intervention;  Surgeon: Marlaine Hind, MD;  Location: [facility] CATH;  Service: Cardiology   ??? PR CATH PLACE/CORON ANGIO, IMG SUPER/INTERP,W LEFT HEART VENTRICULOGRAPHY N/A 05/12/2018    Procedure: CATH LEFT HEART CATHETERIZATION W INTERVENTION;  Surgeon: Dorathy Kinsman, MD;  Location: [facility] CATH;  Service: Cardiology   ??? PR CATH PLACE/CORON ANGIO, IMG SUPER/INTERP,W LEFT HEART VENTRICULOGRAPHY N/A 10/22/2018    Procedure: CATH LEFT HEART CATHETERIZATION W INTERVENTION;  Surgeon: Marlaine Hind, MD;  Location: [facility] CATH;  Service: Cardiology   ??? PR ECMO/ECLS INITIATION VENO-ARTERIAL N/A 03/19/2017    Procedure: ECMO/ECLS; INITIATION, VENO-ARTERIAL;  Surgeon: Lennie Odor, MD;  Location: MA[facility] OR [facility];  Service: Cardiac Surgery   ??? PR ECMO/ECLS RMVL PRPH CANNULA OPEN 6 YRS & OLDER Left 03/25/2017    Procedure: ECMO / ECLS PROVIDED BY PHYSICIAN; REMOVAL OF PERIPHERAL (ARTERIAL AND/OR VENOUS) CANNULA(E), OPEN, 6 YEARS AND OLDER;  Surgeon: Alonna Buckler Ikonomidis, MD;  Location: MAIN OR [facility];  Service: Cardiac Surgery   ??? PR EPHYS EVAL W/ ABLATION SUPRAVENT ARRHYTHMIA N/A 07/19/2015    Catheter ablation of cavotricuspid isthmus for atrial flutter [facility])   ??? PR INSER HART PACER XVENOUS ATRIAL N/A 05/30/2015    [employer] dual-chamber pacemaker implant (E.Chung)   ??? PR INSERT/PLACE FLOW DIRECT CATH N/A 03/17/2017    Procedure: Insert Leave In Gilt Edge;  Surgeon: Marlaine Hind, MD;  Location: [facility] CATH;  Service: Cardiology   ??? PR INSJ/RPLCMT PERM DFB W/TRNSVNS LDS 1/DUAL CHMBR N/A 05/04/2017    Procedure: ICD Implant System (Single/Dual);  Surgeon: Eldred Manges, MD;  Location: [facility] CATH;  Service: Cardiology   ??? PR NEGATIVE PRESSURE WOUND THERAPY DME >50 SQ CM Left 03/25/2017    Procedure: Neg Press Wound Tx (Vac Assist) Incl Topicals, Per Session, Tsa Greater Than/= 50 Cm Squared;  Surgeon: Alonna Buckler Ikonomidis, MD;  Location: MAIN OR [facility];  Service: Cardiac Surgery   ??? PR PRQ TRLUML CORONARY STENT W/ANGIO ONE ART/BRNCH N/A 03/12/2018    Procedure: Percutaneous Coronary Intervention;  Surgeon: Marlaine Hind, MD;  Location: [facility] CATH;  Service: Cardiology   ??? PR REBL VES DIRECT,LOW EXTREM Left 03/19/2017    Procedure: Repr Bld Vessel Direct; Lower Extrem;  Surgeon: Boykin Reaper, MD;  Location: MAIN OR [facility];  Service: Vascular   ??? PR REMV ART CLOT ILIAC-POP,LEG INCIS Left 03/25/2017    Procedure: EMBOLECTOMY OR THROMBECTOMY, WITH OR WITHOUT CATHETER; FEMOROPOPLITEAL, AORTOILIAC ARTERY, BY LEG INCISION;  Surgeon: Alonna Buckler Ikonomidis, MD;  Location: MAIN OR [facility];  Service: Cardiac Surgery   ??? PR UPPER GI ENDOSCOPY,BIOPSY N/A 02/28/2016    Procedure: UGI ENDOSCOPY; WITH BIOPSY, SINGLE OR MULTIPLE;  Surgeon: Liane Comber, MD;  Location: GI PROCEDURES [facility];  Service: Gastroenterology   ??? PR UPPER GI ENDOSCOPY,DIAGNOSIS N/A 05/07/2016    Procedure: UGI ENDO, INCLUDE ESOPHAGUS, STOMACH, & DUODENUM &/OR JEJUNUM; DX W/WO COLLECTION SPECIMN, BY BRUSH OR WASH;  Surgeon: Modena Nunnery, MD;  Location: [facility];  Service: Gastroenterology     FAMILY HISTORY:     Family History   Problem Relation Age of Onset   ??? Diabetes Mother    ??? Hyperlipidemia Mother    ??? Heart disease Mother    ??? Basal cell carcinoma Mother    ??? Blindness Mother         diabeties   ??? Obesity Mother    ??? Hyperlipidemia Father    ??? Heart disease Father    ??? Lung disease Father    ??? Heart disease Half-Sister    ???  Kidney disease Half-Sister    ??? Gallbladder disease Half-Brother    ??? Obesity Half-Brother    ??? No Known Problems Daughter    ??? No Known Problems Son    ??? Obesity Daughter    ??? Melanoma Neg Hx    ??? Squamous cell carcinoma Neg Hx    ??? Macular degeneration Neg Hx    ??? Glaucoma Neg Hx        SOCIAL HISTORY:   He and his wife face significant social challenges without a reliable home. They are now living in a building of his nephew's for a small amount of rent.  MEDICATIONS:     Current Outpatient Medications on File Prior to Visit   Medication Sig Dispense Refill   ??? acetaminophen (TYLENOL) 500 MG tablet TAKE 2 TABLETS (1 000MG ) BY MOUTH EVERY 8 HOURS AS NEEDED FOR PAIN 180 each 3   ??? albuterol HFA 90 mcg/actuation inhaler Inhale 2 puffs into the lungs every 6 (six) hours as needed for wheezing or shortness of breath. 18 g 2   ??? alirocumab (PRALUENT) 150 mg/mL subcutaneous injection Inject 1 mL (150 mg total) under the skin every fourteen (14) days. 4 Syringe 11   ??? allopurinoL (ZYLOPRIM) 100 MG tablet Take 1.5 tablets (150 mg total) by mouth daily. 60 tablet 3   ??? amiodarone (PACERONE) 200 MG tablet TAKE 1 TABLET BY MOUTH ONCE DAILY 90 tablet 3   ??? amLODIPine (NORVASC) 10 MG tablet Take 1 tablet (10 mg total) by mouth daily. 90 tablet 3   ??? atorvastatin (LIPITOR) 80 MG tablet Take 1 tablet (80 mg total) by mouth daily. 90 tablet 3   ??? blood sugar diagnostic Strp Disp to match current ins approved meter. Use to test blood sugar four times daily. E11.22, Z79.4 400 each 3   ??? blood-glucose meter kit Disp. blood glucose meter kit preferred by patient's insurance. Dx: Diabetes, E11.9 1 each 11   ??? bumetanide (BUMEX) 2 MG tablet Take 1 tablet (2 mg total) by mouth two (2) times a day. 180 tablet 3   ??? carvediloL (COREG) 25 MG tablet Take 1.5 tablets (37.5 mg total) by mouth Two (2) times a day. 270 tablet 3   ??? cholecalciferol, vitamin D3, 5,000 unit capsule Take 1 capsule (5,000 Units total) by mouth daily. 100 capsule 0   ??? diclofenac sodium (VOLTAREN) 1 % gel Apply 2 grams topically Three (3) times a day. 100 g 11   ??? empty container Misc Use as directed to dispose of injectable medications 1 each 3   ??? ezetimibe (ZETIA) 10 mg tablet TAKE 1 TABLET BY MOUTH ONCE DAILY 90 each 3   ??? FLUoxetine (PROZAC) 40 MG capsule Take 2 capsules (80 mg total) by mouth daily. 180 capsule 3   ??? gabapentin (NEURONTIN) 300 MG capsule Take 1 capsule (300 mg total) by mouth two (2) times a day. 180 capsule 3   ??? hydrALAZINE (APRESOLINE) 25 MG tablet Take 1 tablet (25 mg total) by mouth daily. 90 tablet 3   ??? insulin ASPART (NOVOLOG FLEXPEN U-100 INSULIN) 100 unit/mL (3 mL) injection pen Inject 20 units under the skin three (3) times a day with a meal. 15 mL 11   ??? insulin detemir U-100 (LEVEMIR) 100 unit/mL (3 mL) injection pen Inject 40 Units total under the skin nightly. 15 mL 3   ??? insulin syringe-needle U-100 0.3 mL 31 gauge x 5/16 Syrg Use to inject insulin twice daily with meals 60  each 0   ??? isosorbide mononitrate (IMDUR) 30 MG 24 hr tablet Take 4 tablets (120 mg total) by mouth daily. 120 tablet 3   ??? lancets Misc Disp. Ins preferred lancets. Use to test blood sugar four times daily. E11.22, Z79.4 400 each 3   ??? loperamide (IMODIUM) 2 mg capsule Take 1 capsule (2 mg total) by mouth 4 (four) times a day as needed for diarrhea. 30 capsule 0   ??? miscellaneous medical supply (BLOOD PRESSURE CUFF) Misc Measure BP once a day or if any symptoms 1 each 0   ??? nitroglycerin (NITROSTAT) 0.4 MG SL tablet Place 1 tablet (0.4 mg total) under the tongue every five (5) minutes as needed for chest pain. 100 each 3   ??? omeprazole (PRILOSEC) 40 MG capsule Take 1 capsule (40 mg total) by mouth Two (2) times a day (30 minutes before a meal). 180 capsule 3   ??? ondansetron (ZOFRAN) 4 MG tablet Take 1 tablet (4 mg total) by mouth every eight (8) hours as needed for nausea for up to 4 days. 10 tablet 0   ??? ondansetron (ZOFRAN) 4 MG tablet Take 1 tablet (4 mg total) by mouth every eight (8) hours as needed for nausea. 30 tablet 0   ??? rivaroxaban (XARELTO) 15 mg Tab Take 1 tablet (15 mg total) by mouth daily with evening meal. 90 tablet 3   ??? semaglutide 0.25 mg or 0.5 mg(2 mg/1.5 mL) PnIj Inject 0.5 mg under the skin every seven (7) days. 4.5 mL 3   ??? sevelamer (RENVELA) 800 mg tablet TAKE 2 TABLETS (1600MG ) BY MOUTH THREE TIMES DAILY WITH MEALS 540 tablet 3   ??? sodium polystyrene, SPS, with sorbitol (SPS, WITH SORBITOL,) 15-20 gram/60 mL Susp Take 60 mL (15 g) by mouth once a week. 473 mL 3   ??? ticagrelor (BRILINTA) 90 mg Tab TAKE 1 TABLET BY MOUTH TWICE DAILY 180 tablet 3   ??? UNIFINE PENTIPS 31 gauge x 5/16 (8 mm) Ndle USE WITH INSULIN AND VICTOZA 5 TIMES DAILY 400 each 3   ??? [DISCONTINUED] liraglutide (VICTOZA 2-PAK) 0.6 mg/0.1 mL (18 mg/3 mL) injection Inject 0.1 mL (0.6 mg total) under the skin daily. 3 mL 1   ??? ciprofloxacin-dexamethasone (CIPRODEX) 0.3-0.1 % otic suspension Administer 4 drops to the right ear Two (2) times a day for 7 days. (Patient not taking: Reported on 05/10/2019) 7.5 mL 0   ??? [DISCONTINUED] ranolazine (RANEXA) 500 MG 12 hr tablet Take by mouth two (2) times a day. 60 tablet 0   ??? [DISCONTINUED] traZODone (DESYREL) 50 MG tablet Take 1 tablet (50 mg total) by mouth nightly. 90 tablet 3     No current facility-administered medications on file prior to visit.      ALLERGIES:     Allergies   Allergen Reactions   ??? Losartan      Hyperkalemia (K>6)     OBJECTIVE:     Vitals:    05/10/19 1004   BP: 109/68   Pulse: 85       Physical Exam   Constitutional: He is oriented to person, place, and time.   Morbidly obese   HENT:   Nose: Nose normal.   Eyes: Conjunctivae are normal.   Mallampati type IV palate   Neck: Neck supple.   Cardiovascular: Normal rate, normal heart sounds and intact distal pulses.   Pulmonary/Chest: Effort normal and breath sounds normal.   Neurological: He is alert and oriented to person, place, and  time.   Skin: Skin is warm. No rash noted.   Psychiatric: Mood and affect normal.         MEDICAL DECISION MAKING:     Prior Polysomnography Results:    PAP titration study on 01/23/2015:  IMPRESSION:  ??  1.          This CPAP trial achieved good treatment of apnea when the patient was positioned in the side position, but could not effectively treat apnea when the patient was on his back.  2.          The patient is noted to be taking ultram medication.  All opioids, including ultram, worsen apnea and will interfere with therapy.  3.          Increased EMG activity in REM noted ??? likely medication effect (Prozac)  4.          Irregularly irregular cardiac rhythm noted throughout the cardiac rhythm strip ??? consistent with history of atrial fibrillation.  ??  RECOMMENDATIONS:  ??  1.          The patient should taper off use of all opioid medications.  He should use these only sparingly at the least absolute doses necessary if he is not fully able to discontinue.  2.          If the patient can sleep in the lateral position only, then he would be well treated with CPAP 16 and expiratory pressure relief of 3. If he can sleep only lateral then he should have CPAP 16 with EPR 3, heated humidity for nasal dryness.   Mask: Fisher Paykel Simplus full face, size medium.  3.         Side sleep can be reinforced by use of a strict lateral positioner sleep belt available from commercial vendors, including ZZOMA, REMATEE, and SLUMBERBUMP.  4.          If the patient is unable to  avoid sleeping on his back, then he would need to return for a repeat PAP titration study.  5.         Continue efforts at aggressive weight loss.    Lab Results   Component Value Date    FERRITIN 149.0 08/12/2018     Lab Results   Component Value Date    IRON 52 08/12/2018    TIBC 262.8 08/12/2018    FERRITIN 149.0 08/12/2018     Lab Results   Component Value Date    TSH 4.832 (H) 07/02/2018     Lab Results   Component Value Date    VITAMINB12 272 04/14/2019       Author:  Leane Platt M Charlsie Fleeger 05/10/2019 10:51 AM

## 2019-05-09 ENCOUNTER — Institutional Professional Consult (permissible substitution): Admit: 2019-05-09 | Discharge: 2019-05-10 | Payer: MEDICARE | Attending: Medical | Primary: Medical

## 2019-05-09 DIAGNOSIS — I10 Essential (primary) hypertension: Principal | ICD-10-CM

## 2019-05-09 DIAGNOSIS — Z794 Long term (current) use of insulin: Principal | ICD-10-CM

## 2019-05-09 DIAGNOSIS — N184 Chronic kidney disease, stage 4 (severe): Principal | ICD-10-CM

## 2019-05-09 DIAGNOSIS — M150 Primary generalized (osteo)arthritis: Principal | ICD-10-CM

## 2019-05-09 DIAGNOSIS — E1122 Type 2 diabetes mellitus with diabetic chronic kidney disease: Principal | ICD-10-CM

## 2019-05-09 NOTE — Unmapped (Addendum)
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 As we discussed today during your appointment, I have recommended the following:      You have a condition called gastroparesis.  This is when the stomach is extra slow and moving foods out causing nausea, bloating, abdominal pain, reflux, etc.  Please review the link below for dietary recommendations.      [email or web address]      It is important that you remain active in your healthcare.  If further work-up, tests or consultations are recommended, you should be contacted to get these scheduled.  If for any reason, you do not hear from someone within 7 days to get these scheduled, you should reach out to me via EPIC MyChart or at one of the numbers below and together, we'll ensure you get the appropriate appointments.       Notes and/or results from recommended tests or consultation are to be sent to me as the ordering provider.  After I receive these results, I will follow-up with you as appropriate by [facility] MyChart, phone, letter or at your scheduled follow-up clinic appointment.  If for whatever reason you do not hear from me within 7-10 days after you've completed any tests/work-up, but do require further recommendations and/or instructions based on the results prior to a scheduled follow-up clinic visit, please contact me.     Please do not hesitate to reach out to me if you have questions or concerns in the interim.    Faylene Million Lance Bosch, PA-C  [facility]  [employer] [facility]  [address]  [place], [place] [address]  Phone: [phone]  Fax: [phone]     [employer] RN: [phone]   Fax: [phone]     Important phone numbers:  GI Clinic Appointments and GI Procedure Appointments:  [phone]  To schedule, reschedule, or cancel your GI clinic or procedure appointment, please call [phone]. If you are unable to come to an appointment, please notify us as soon as possible, preferably 24 hours in advance. This allows other patients with urgent needs to be cared for quicker.     Radiology: [phone], option 3 or 4     For emergencies after normal business hours or on weekends/holidays   If you are experiencing a medical emergency or need urgent medical care, call 911 immediately.    For GI medical needs after hours or on holidays, please call [phone] and ask to speak to the Gastroenterology Fellow on call.    Forms, Letters, Medical Records: [id number]   [email or web address]          Click Here to Visit [facility] Covid-19 Vaccine Hub  Get the latest facts on the COVID-19 vaccines.      Or visit [facility]???s COVID-19 Vaccine Hub at [email or web address] to review the latest facts about the vaccines.

## 2019-05-09 NOTE — Unmapped (Signed)
[facility] ([facility])      [facility] Faculty Return Visit Note                    Initial Referring Provider:  None None  [place],  [place] [address]    Primary Care Provider:  Kurtis Bushman, MD    Patient Care Team:  Jeremy Schanz, MD as PCP - General (Internal Medicine)  Jeremy Schanz, MD as PCP - Rande Brunt (Internal Medicine)  Jeremy Cotton, PA as Physician Assistant (Internal Medicine)  Jeremy Latus, LCSW as Social Worker (Internal Medicine)  Jeremy Oliver as Nurse Practitioner (Cardiology)  Jeremy Hind, MD as Referring Physician (Cardiology)  Jeremy Gold, RN as Case Manager      Jeremy Oliver is a 49 y.o. male (DOB: 1971-01-28) who returns for diabetic gastroparesis and esophagitis with nausea and vomiting.     ASSESSMENT:    This is a 49 y.o. year old male with esophagitis and uncontrolled DM (HgbA1c 7.2) w/gastroparesis who presents with a year history of intermittent nausea and vomiting of undigested food from 12+hours prior.  Has complicated medical history including CKD (Cr 4.7), recent NSTEMI and is a very poor candidate for repeat left heart cath given his advanced CKD and likelihood??of progression to dialysis.??Patient strongly states he would not like to go through dialysis though seems to be contemplating this.  Surgical candidacy for antireflux surgery even less likely with considerable comorbidities.  Is not following gastroparesis diet and cannot have any sedating procedures at this time.   ??        PLAN:    1.  Since he is asymptomatic off cholesterol medication he plans to talk to his PCP about changing this to something else.    2.  Discussed gastroparesis diet again - while GES was normal, this diet will help reduce gastric workload and could improve nausea/vomiting of undigested foods from hours prior.   3.  Follow up in as needed.    4.  My contact information was provided and he will call with any questions or concerns in the interim.          The patient was not located and I was not located within 250 yards of a hospital based location during the real-time audio and video visit.     I spent 17 minutes on the real-time audio and video with the patient on the date of service. I spent an additional 4 minutes on pre- and post-visit activities.     The patient was physically located in [place] or a state in which I am permitted to provide care. The patient and/or parent/guardian understood that s/he [place] incur co-pays and cost sharing, and agreed to the telemedicine visit. The visit was reasonable and appropriate under the circumstances given the patient's presentation at the time.    The patient and/or parent/guardian has been advised of the potential risks and limitations of this mode of treatment (including, but not limited to, the absence of in-person examination) and has agreed to be treated using telemedicine. The patient's/patient's family's questions regarding telemedicine have been answered.     If the visit was completed in an ambulatory setting, the patient and/or parent/guardian has also been advised to contact their provider???s office for worsening conditions, and seek emergency medical treatment and/or call 911 if the patient deems either necessary.  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 year old male presenting to the [place] [facility] [place] [facility] clinic today in follow up for DM gastroparesis and GERD.  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     [name] visit is conducted by video today due to COVID-19.  [name] states he is home with his wife, Jeremy Oliver,  and agrees to proceed with this visit in her presence.     [name] was last evaluated in clinic on 10/12/2018 and presents today in follow up.  I had advised him to follow the gastroparesis diet to reduce gastric work load.  He tells me he is following the diet and is very strict about it, however when I ask him what he had for lunch he says he had a tomato sandwich and a hamburger.  He believes his symptoms are related to his cholesterol medication because he hasn't taken it for the past two weeks and has been completely asymptomatic.  No weight loss or dysphagia.  Has had several MIs since his last visit and is anxious about any sedating procedure.     DIAGNOSTIC STUDIES:  I have reviewed and summarized previous medical records which illustrated:     GI Procedures:    EGD 05/07/2016:   - LA Grade A reflux esophagitis.  - Gastritis.  - Erythematous duodenopathy.  - Normal second portion of the duodenum.  - No specimens collected.  ??  EGD 02/28/2016:   - LA Grade C reflux esophagitis.  - Tortuous esophagus.  - Erythematous mucosa in the antrum. Biopsied.  - Erythematous duodenopathy.  ??  A: Stomach, biopsy   - Antral-type mucosa with changes consistent with reactive gastropathy  - Histologically unremarkable oxyntic-type mucosa   - No Helicobacter organisms identified by H&E stain  - No intestinal metaplasia or dysplasia identified     Radiographic studies:    Telehealth Fundus Photography Screening - Ou - Both Eyes    Result Date: 04/19/2019  Laterality: both eyes.     Right Eye Impression: diabetes with moderate non-proliferative retinopathy.     Left Eye Impression: diabetes with moderate non-proliferative retinopathy.     General Details Management plan: follow up with retinal photos in 3 months.       Laboratory results    None    REVIEW OF SYSTEMS:    Pertinent positives and negatives are documented as per HPI; all other systems reviewed and negative.      CURRENT MEDICATIONS:    Current Outpatient Medications   Medication Sig Dispense Refill   ???  acetaminophen (TYLENOL) 500 MG tablet TAKE 2 TABLETS (1 000MG ) BY MOUTH EVERY 8 HOURS AS NEEDED FOR PAIN 180 each 3   ??? albuterol HFA 90 mcg/actuation inhaler Inhale 2 puffs into the lungs every 6 (six) hours as needed for wheezing or shortness of breath. 18 g 2   ??? alirocumab (PRALUENT) 150 mg/mL subcutaneous injection Inject 1 mL (150 mg total) under the skin every fourteen (14) days. 4 Syringe 11   ??? allopurinoL (ZYLOPRIM) 100 MG tablet Take 1.5 tablets (150 mg total) by mouth daily. 60 tablet 3   ??? amiodarone (PACERONE) 200 MG tablet TAKE 1 TABLET BY MOUTH ONCE DAILY 90 tablet 3   ??? amLODIPine (NORVASC) 10 MG tablet Take 1 tablet (10 mg total) by mouth daily. 90 tablet 3   ??? atorvastatin (LIPITOR) 80 MG tablet Take 1 tablet (80 mg total) by mouth daily. 90 tablet 3   ??? blood sugar diagnostic Strp Disp to match current ins approved meter. Use to test blood sugar four times daily. E11.22, Z79.4 400 each 3   ??? blood-glucose meter kit Disp. blood glucose meter kit preferred by patient's insurance. Dx: Diabetes, E11.9 1 each 11   ??? bumetanide (BUMEX) 2 MG tablet Take 1 tablet (2 mg total) by mouth two (2) times a day. 180 tablet 3   ??? carvediloL (COREG) 25 MG tablet Take 1.5 tablets (37.5 mg total) by mouth Two (2) times a day. 270 tablet 3   ??? cholecalciferol, vitamin D3, 5,000 unit capsule Take 1 capsule (5,000 Units total) by mouth daily. 100 capsule 0   ??? diclofenac sodium (VOLTAREN) 1 % gel Apply 2 grams topically Three (3) times a day. 100 g 11   ??? empty container Misc Use as directed to dispose of injectable medications 1 each 3   ??? ezetimibe (ZETIA) 10 mg tablet TAKE 1 TABLET BY MOUTH ONCE DAILY 90 each 3   ??? FLUoxetine (PROZAC) 40 MG capsule Take 2 capsules (80 mg total) by mouth daily. 180 capsule 3   ??? gabapentin (NEURONTIN) 300 MG capsule Take 1 capsule (300 mg total) by mouth two (2) times a day. 180 capsule 3   ??? hydrALAZINE (APRESOLINE) 25 MG tablet Take 1 tablet (25 mg total) by mouth daily. 90 tablet 3   ??? insulin ASPART (NOVOLOG FLEXPEN U-100 INSULIN) 100 unit/mL (3 mL) injection pen Inject 20 units under the skin three (3) times a day with a meal. 15 mL 11   ??? insulin detemir U-100 (LEVEMIR) 100 unit/mL (3 mL) injection pen Inject 40 Units total under the skin nightly. 15 mL 3   ??? insulin syringe-needle U-100 0.3 mL 31 gauge x 5/16 Syrg Use to inject insulin twice daily with meals 60 each 0   ??? isosorbide mononitrate (IMDUR) 30 MG 24 hr tablet Take 4 tablets (120 mg total) by mouth daily. 120 tablet 3   ??? lancets Misc Disp. Ins preferred lancets. Use to test blood sugar four times daily. E11.22, Z79.4 400 each 3   ??? loperamide (IMODIUM) 2 mg capsule Take 1 capsule (2 mg total) by mouth 4 (four) times a day as needed for diarrhea. 30 capsule 0   ??? miscellaneous medical supply (BLOOD PRESSURE CUFF) Misc Measure BP once a day or if any symptoms 1 each 0   ??? nitroglycerin (NITROSTAT) 0.4 MG SL tablet Place 1 tablet (0.4 mg total) under the tongue every five (5) minutes as needed for chest pain. 100 each 3   ??? omeprazole (  PRILOSEC) 40 MG capsule Take 1 capsule (40 mg total) by mouth Two (2) times a day (30 minutes before a meal). 180 capsule 3   ??? ondansetron (ZOFRAN) 4 MG tablet Take 1 tablet (4 mg total) by mouth every eight (8) hours as needed for nausea for up to 4 days. 10 tablet 0   ??? ondansetron (ZOFRAN) 4 MG tablet Take 1 tablet (4 mg total) by mouth every eight (8) hours as needed for nausea. (Patient not taking: Reported on 04/14/2019) 30 tablet 0   ??? rivaroxaban (XARELTO) 15 mg Tab Take 1 tablet (15 mg total) by mouth daily with evening meal. 90 tablet 3   ??? semaglutide 0.25 mg or 0.5 mg(2 mg/1.5 mL) PnIj Inject 0.5 mg under the skin every seven (7) days. 4.5 mL 3   ??? sevelamer (RENVELA) 800 mg tablet TAKE 2 TABLETS (1600MG ) BY MOUTH THREE TIMES DAILY WITH MEALS 540 tablet 3   ??? sodium polystyrene, SPS, with sorbitol (SPS, WITH SORBITOL,) 15-20 gram/60 mL Susp Take 60 mL (15 g) by mouth once a week. 473 mL 3   ??? ticagrelor (BRILINTA) 90 mg Tab TAKE 1 TABLET BY MOUTH TWICE DAILY 180 tablet 3   ??? UNIFINE PENTIPS 31 gauge x 5/16 (8 mm) Ndle USE WITH INSULIN AND VICTOZA 5 TIMES DAILY 400 each 3     No current facility-administered medications for this visit.      (Not in a hospital admission)             ALLERGIES:    Allergies   Allergen Reactions   ??? Losartan      Hyperkalemia (K>6)         VITAL SIGNS:    There were no vitals taken for this visit.    PHYSICAL EXAM:  Physical exam is not performed today due to telehealth visit.

## 2019-05-10 ENCOUNTER — Ambulatory Visit
Admit: 2019-05-10 | Discharge: 2019-05-11 | Payer: MEDICARE | Attending: Pediatric Pulmonology | Primary: Pediatric Pulmonology

## 2019-05-10 DIAGNOSIS — Z794 Long term (current) use of insulin: Principal | ICD-10-CM

## 2019-05-10 DIAGNOSIS — G4733 Obstructive sleep apnea (adult) (pediatric): Principal | ICD-10-CM

## 2019-05-10 DIAGNOSIS — N184 Chronic kidney disease, stage 4 (severe): Principal | ICD-10-CM

## 2019-05-10 DIAGNOSIS — E1122 Type 2 diabetes mellitus with diabetic chronic kidney disease: Principal | ICD-10-CM

## 2019-05-10 NOTE — Unmapped (Signed)
[facility]  [address]  [address]  [place], [place] [address]  Phone: [phone]  Fax: [phone]        Our plan for your sleep issues as discussed today is as follows:  1.  Sleep study - 05/17/2019- Tuesday  2.  COVID test this Saturday 05/14/2019 a 09:15- Ambulatory care center in [place]    Follow up in 2 months  Please feel free to call the clinic number with questions or concerns.     Directions to [facility]:  Parking is available in the parking garage ([address] deck ??? visitor parking) on [address].  You will be responsible to pay for your own parking.   Enter the main lobby of [facility].  Check in at the Registration/Admitting desk.  We are located on the 1st floor of the [employer].      [facility]: Each private sleep room has a television and a bath with a shower.  There is not a phone in the room, but there is one available if needed.  Sensors and monitors will be attached to your head and body.  These are used to diagnose sleep disorders.  If you need to wake up at a certain time in the morning following your study, please let your technologist know.     In the morning, the wires and monitors are removed allowing you to shower and dress.  However, if you are scheduled for a Multiple Sleep Latency Test (MSLT or nap study) the wires on the face and head are not removed (see attached instructions).                  Instruction for parents/caregivers whose child has a sleep study:  the TV needs to be off and a quiet environment during sleep testing to ensure a high quality data with accurate results. It's also best if the parent is not in the bed with the child unless they need to be for safety reasons.       Contact: The staff of the [facility] Sleep Disorders Laboratory is looking forward to your visit.  If you have questions please call [phone].

## 2019-05-10 NOTE — Unmapped (Signed)
Pt requesting letter be written that [name], the family dog, is his companion dog.   He and wife are 3rd in line to get a home and state they are not allowed to bring their dog unless the Provider states the dog is a companion dog.    Routed draft letter. Will follow up with provider.

## 2019-05-13 NOTE — Unmapped (Signed)
I was the supervising physician in the delivery of the service. Latish Toutant D Amedio Bowlby, MD

## 2019-05-13 NOTE — Unmapped (Signed)
Awaiting information from SW team regarding ADA qualifications for companion pet.  Deleted draft letter in the meantime.

## 2019-05-13 NOTE — Unmapped (Signed)
I was the supervising physician in the delivery of the service. Makih Stefanko D Lataisha Colan, MD

## 2019-05-14 ENCOUNTER — Ambulatory Visit
Admit: 2019-05-14 | Discharge: 2019-05-15 | Payer: MEDICARE | Attending: Nurse Practitioner | Primary: Nurse Practitioner

## 2019-05-14 NOTE — Unmapped (Signed)
Patient called requesting COVID testing appt information for pre-test.  Went over information per appt desk.    Patient voiced understanding, no further questions or concerns voiced.

## 2019-05-14 NOTE — Unmapped (Signed)
COVID Pre-Procedure Intake Form     Assessment     Jeremy Route Bumbaugh is a 49 y.o. male presenting to [facility] for COVID testing.     Plan     If no testing performed, pt counseled on routine care for respiratory illness.  If testing performed, COVID sent.  Patient directed to Home given findings during today's visit.    Subjective     Jeremy Oliver is a 49 y.o. male who [facility] with complaints of the following:    Exposure History: In the last 21 days?     Have you traveled outside of [place]? No               Have you been in close contact with someone confirmed by a test to have COVID? (Close contact is within 6 feet for at least 10 minutes) No       Have you worked in a health care facility? No     Lived or worked facility like a nursing home, group home, or assisted living?    No         Are you scheduled to have surgery or a procedure in the next 3 days? Yes               Are you scheduled to receive cancer chemotherapy within the next 7 days?    No     Have you ever been tested before for COVID-19 with a swab of your nose? Yes: When: n/a, Where: n/a   Are you a healthcare worker being tested so to return to work No     Right now,  do you have any of the following that developed over the past 7 days (as stated by patient on intake form):    Subjective fever (felt feverish) No   Chills (especially repeated shaking chills) No   Severe fatigue (felt very tired) No   Muscle aches No   Runny nose No   Sore throat No   Loss of taste or smell No   Cough (new onset or worsening of chronic cough) No   Shortness of breath No   Nausea or vomiting No   Headache No   Abdominal Pain No   Diarrhea (3 or more loose stools in last 24 hours) No       Scribe's Attestation: Laqueta Carina, FNP, obtained and performed the history, physical exam and medical decision making elements that were entered into the chart.  Signed by Arna Medici serving as Scribe, on 05/14/2019 9:33 AM

## 2019-05-15 ENCOUNTER — Ambulatory Visit: Admit: 2019-05-15 | Discharge: 2019-05-17 | Payer: MEDICARE

## 2019-05-17 DIAGNOSIS — I10 Essential (primary) hypertension: Principal | ICD-10-CM

## 2019-05-17 DIAGNOSIS — G4733 Obstructive sleep apnea (adult) (pediatric): Principal | ICD-10-CM

## 2019-05-17 DIAGNOSIS — I2510 Atherosclerotic heart disease of native coronary artery without angina pectoris: Principal | ICD-10-CM

## 2019-05-17 NOTE — Unmapped (Addendum)
[name] going to Library to print off in epic the Letter to assist them w/ having their dog when receiving a new resident.

## 2019-05-17 NOTE — Unmapped (Signed)
Letter put in chart regarding companion pet and sent to patient via MyChart

## 2019-05-18 NOTE — Unmapped (Signed)
Sleep Medicine Phone Encounter Note      Discussed with patient's wife about severe obstructive sleep apnea diagnosis and treatment with bilevel PAP therapy and lateral position therapy.  They are currently homeless but are living in a building.  I discussed that I have ordered the BiPAP machine and should be delivered within the next few days.        Tobie Hellen Kai Levins, MD  Fellow- [facility]

## 2019-05-19 NOTE — Unmapped (Signed)
I was the supervising physician in the delivery of the service. Tejuan Gholson D Dvaughn Fickle, MD

## 2019-05-20 DIAGNOSIS — M1A0621 Idiopathic chronic gout, left knee, with tophus (tophi): Principal | ICD-10-CM

## 2019-05-20 DIAGNOSIS — N184 Chronic kidney disease, stage 4 (severe): Principal | ICD-10-CM

## 2019-05-20 MED ORDER — ISOSORBIDE MONONITRATE ER 30 MG TABLET,EXTENDED RELEASE 24 HR
ORAL_TABLET | Freq: Every day | ORAL | 3 refills | 30 days | Status: CP
Start: 2019-05-20 — End: ?
  Filled 2019-05-25: qty 120, 30d supply, fill #0

## 2019-05-20 MED ORDER — NITROGLYCERIN 0.4 MG SUBLINGUAL TABLET
SUBLINGUAL | 3 refills | 1 days | Status: CP | PRN
Start: 2019-05-20 — End: 2020-05-19
  Filled 2019-05-25: qty 100, 33d supply, fill #0

## 2019-05-20 MED ORDER — ALBUTEROL SULFATE HFA 90 MCG/ACTUATION AEROSOL INHALER
RESPIRATORY_TRACT | 2 refills | 0 days | Status: CP
Start: 2019-05-20 — End: ?
  Filled 2019-05-25: qty 18, 25d supply, fill #0

## 2019-05-20 MED ORDER — INSULIN DETEMIR (U-100) 100 UNIT/ML (3 ML) SUBCUTANEOUS PEN
Freq: Every evening | SUBCUTANEOUS | 3 refills | 37 days | Status: CP
Start: 2019-05-20 — End: ?
  Filled 2019-06-03: qty 15, 37d supply, fill #0

## 2019-05-21 NOTE — Unmapped (Signed)
CCM Intake:      PCP Action:please order tramadol refill, and sign pended immodium and zofran rxs. unable to do per protocol. thank you!         Patient Action:go to ER for acute CP, CP not relieved by 2 nitroglycerins and./or any other acute changes.     Pt's wife states pt needs refills :    Tramadol  Immodium   zofran    Nitroglycerin  Isosorbide   levemir  Ventolin    Wife states pt fell 2 days ago in parking lot. Denied need to be seen then and pt states, did fall on left side of body but feeling better today. NAD noted    Pt's wife states over last week pt will take nitroglycerin almost every day.  After second nitroglycerin, CP subsides. They verbalize understanding to go to ER as needed.    Nitroglycerin, isosorbide, levemir and ventolin refilled per protocol.     immodium and zofran pended , tramadol not on active list.     Pt denies need to be seen in [facility] Monday 05/23/19. Will call for any changes.

## 2019-05-21 NOTE — Unmapped (Signed)
Received following via Epic Chat        Upon chart review, appears that had consultation with Rheum on 11/2018 given chronic gout with worsening CKD.  At that appointment allopurinol was increased to 150mg  daily with plans to recheck uric acid. Uric acid has not been rechecked. Will note to check at next visit and will touch base with rheumatology before any dose adjustmentst.

## 2019-05-24 NOTE — Unmapped (Signed)
[facility] Clinical Assessment & Refill Coordination Note    Jeremy Oliver, DOB: May 23, 1970  Phone: [phone] (home)     All above HIPAA information was verified with patient's family member, wife.     Was a Nurse, learning disability used for this call? No    Specialty Medication(s):   General Specialty: Praluent     Current Outpatient Medications   Medication Sig Dispense Refill   ??? acetaminophen (TYLENOL) 500 MG tablet TAKE 2 TABLETS (1 000MG ) BY MOUTH EVERY 8 HOURS AS NEEDED FOR PAIN 180 each 3   ??? albuterol HFA 90 mcg/actuation inhaler Inhale 2 puffs into the lungs every 6 (six) hours as needed for wheezing or shortness of breath. 18 g 2   ??? alirocumab (PRALUENT) 150 mg/mL subcutaneous injection Inject 1 mL (150 mg total) under the skin every fourteen (14) days. 4 Syringe 11   ??? allopurinoL (ZYLOPRIM) 100 MG tablet Take 1.5 tablets (150 mg total) by mouth daily. 60 tablet 3   ??? amiodarone (PACERONE) 200 MG tablet TAKE 1 TABLET BY MOUTH ONCE DAILY 90 tablet 3   ??? amLODIPine (NORVASC) 10 MG tablet Take 1 tablet (10 mg total) by mouth daily. 90 tablet 3   ??? atorvastatin (LIPITOR) 80 MG tablet Take 1 tablet (80 mg total) by mouth daily. 90 tablet 3   ??? blood sugar diagnostic Strp Disp to match current ins approved meter. Use to test blood sugar four times daily. E11.22, Z79.4 400 each 3   ??? blood-glucose meter kit Disp. blood glucose meter kit preferred by patient's insurance. Dx: Diabetes, E11.9 1 each 11   ??? bumetanide (BUMEX) 2 MG tablet Take 1 tablet (2 mg total) by mouth two (2) times a day. 180 tablet 3   ??? carvediloL (COREG) 25 MG tablet Take 1.5 tablets (37.5 mg total) by mouth Two (2) times a day. 270 tablet 3   ??? cholecalciferol, vitamin D3, 5,000 unit capsule Take 1 capsule (5,000 Units total) by mouth daily. 100 capsule 0   ??? diclofenac sodium (VOLTAREN) 1 % gel Apply 2 grams topically Three (3) times a day. 100 g 11   ??? empty container Misc Use as directed to dispose of injectable medications 1 each 3   ??? ezetimibe (ZETIA) 10 mg tablet TAKE 1 TABLET BY MOUTH ONCE DAILY 90 each 3   ??? FLUoxetine (PROZAC) 40 MG capsule Take 2 capsules (80 mg total) by mouth daily. 180 capsule 3   ??? gabapentin (NEURONTIN) 300 MG capsule Take 1 capsule (300 mg total) by mouth two (2) times a day. 180 capsule 3   ??? hydrALAZINE (APRESOLINE) 25 MG tablet Take 1 tablet (25 mg total) by mouth daily. 90 tablet 3   ??? insulin ASPART (NOVOLOG FLEXPEN U-100 INSULIN) 100 unit/mL (3 mL) injection pen Inject 20 units under the skin three (3) times a day with a meal. 15 mL 11   ??? insulin detemir U-100 (LEVEMIR) 100 unit/mL (3 mL) injection pen Inject 40 Units total under the skin nightly. 15 mL 3   ??? insulin syringe-needle U-100 0.3 mL 31 gauge x 5/16 Syrg Use to inject insulin twice daily with meals 60 each 0   ??? isosorbide mononitrate (IMDUR) 30 MG 24 hr tablet Take 4 tablets (120 mg total) by mouth daily. 120 tablet 3   ??? lancets Misc Disp. Ins preferred lancets. Use to test blood sugar four times daily. E11.22, Z79.4 400 each 3   ??? loperamide (IMODIUM) 2 mg  capsule Take 1 capsule (2 mg total) by mouth 4 (four) times a day as needed for diarrhea. 30 capsule 0   ??? miscellaneous medical supply (BLOOD PRESSURE CUFF) Misc Measure BP once a day or if any symptoms 1 each 0   ??? nitroglycerin (NITROSTAT) 0.4 MG SL tablet Place 1 tablet (0.4 mg total) under the tongue every five (5) minutes as needed for chest pain. 100 each 3   ??? omeprazole (PRILOSEC) 40 MG capsule Take 1 capsule (40 mg total) by mouth Two (2) times a day (30 minutes before a meal). 180 capsule 3   ??? ondansetron (ZOFRAN) 4 MG tablet Take 1 tablet (4 mg total) by mouth every eight (8) hours as needed for nausea for up to 4 days. 10 tablet 0   ??? ondansetron (ZOFRAN) 4 MG tablet Take 1 tablet (4 mg total) by mouth every eight (8) hours as needed for nausea. 30 tablet 0   ??? rivaroxaban (XARELTO) 15 mg Tab Take 1 tablet (15 mg total) by mouth daily with evening meal. 90 tablet 3   ??? semaglutide 0.25 mg or 0.5 mg(2 mg/1.5 mL) PnIj Inject 0.5 mg under the skin every seven (7) days. 4.5 mL 3   ??? sevelamer (RENVELA) 800 mg tablet TAKE 2 TABLETS (1600MG ) BY MOUTH THREE TIMES DAILY WITH MEALS 540 tablet 3   ??? sodium polystyrene, SPS, with sorbitol (SPS, WITH SORBITOL,) 15-20 gram/60 mL Susp Take 60 mL (15 g) by mouth once a week. 473 mL 3   ??? ticagrelor (BRILINTA) 90 mg Tab TAKE 1 TABLET BY MOUTH TWICE DAILY 180 tablet 3   ??? UNIFINE PENTIPS 31 gauge x 5/16 (8 mm) Ndle USE WITH INSULIN AND VICTOZA 5 TIMES DAILY 400 each 3     No current facility-administered medications for this visit.         Changes to medications: [name] reports no changes at this time.    Allergies   Allergen Reactions   ??? Losartan      Hyperkalemia (K>6)       Changes to allergies: No    SPECIALTY MEDICATION ADHERENCE     Praluent 150 mg/ml: 14 days of medicine on hand       Medication Adherence    Patient reported X missed doses in the last month: 0  Specialty Medication: Praluent  Patient is on additional specialty medications: No  Informant: spouse          Specialty medication(s) dose(s) confirmed: Regimen is correct and unchanged.     Are there any concerns with adherence? No    Adherence counseling provided? Not needed    CLINICAL MANAGEMENT AND INTERVENTION      Clinical Benefit Assessment:    Do you feel the medicine is effective or helping your condition? Yes    Clinical Benefit counseling provided? Not needed    Adverse Effects Assessment:    Are you experiencing any side effects? No    Are you experiencing difficulty administering your medicine? No    Quality of Life Assessment:    How many days over the past month did your hyperlipidemia  keep you from your normal activities? For example, brushing your teeth or getting up in the morning. 0    Have you discussed this with your provider? Not needed    Therapy Appropriateness:    Is therapy appropriate? Yes, therapy is appropriate and should be continued DISEASE/MEDICATION-SPECIFIC INFORMATION      For patients on injectable medications: Patient currently  has 1 doses left.  Next injection is scheduled for 05/28/19.    PATIENT SPECIFIC NEEDS     ? Does the patient have any physical, cognitive, or cultural barriers? No    ? Is the patient high risk? No     ? Does the patient require a Care Management Plan? No     ? Does the patient require physician intervention or other additional services (i.e. nutrition, smoking cessation, social work)? No      SHIPPING     Specialty Medication(s) to be Shipped:   General Specialty: Praluent    Other medication(s) to be shipped: none     Changes to insurance: No    Delivery Scheduled: Yes, Expected medication delivery date: 05/30/19.     Medication will be delivered via [facility] outpatient pharmacy clinic to the confirmed prescription address in [place].    The patient will receive a drug information handout for each medication shipped and additional FDA Medication Guides as required.  Verified that patient has previously received a Conservation officer, historic buildings.    All of the patient's questions and concerns have been addressed.    Arnold Long   [facility] Specialty Pharmacist

## 2019-05-25 ENCOUNTER — Ambulatory Visit: Admit: 2019-05-25 | Discharge: 2019-05-26 | Payer: MEDICARE

## 2019-05-25 DIAGNOSIS — N185 Chronic kidney disease, stage 5: Principal | ICD-10-CM

## 2019-05-25 MED FILL — NOVOLOG FLEXPEN U-100 INSULIN ASPART 100 UNIT/ML (3 ML) SUBCUTANEOUS: SUBCUTANEOUS | 25 days supply | Qty: 15 | Fill #5

## 2019-05-25 MED FILL — ALLOPURINOL 100 MG TABLET: 40 days supply | Qty: 60 | Fill #3 | Status: AC

## 2019-05-25 MED FILL — NOVOLOG FLEXPEN U-100 INSULIN ASPART 100 UNIT/ML (3 ML) SUBCUTANEOUS: 25 days supply | Qty: 15 | Fill #5 | Status: AC

## 2019-05-25 MED FILL — VENTOLIN HFA 90 MCG/ACTUATION AEROSOL INHALER: 25 days supply | Qty: 18 | Fill #0 | Status: AC

## 2019-05-25 MED FILL — ISOSORBIDE MONONITRATE ER 30 MG TABLET,EXTENDED RELEASE 24 HR: 30 days supply | Qty: 120 | Fill #0 | Status: AC

## 2019-05-25 MED FILL — ALLOPURINOL 100 MG TABLET: ORAL | 40 days supply | Qty: 60 | Fill #3

## 2019-05-25 MED FILL — NITROGLYCERIN 0.4 MG SUBLINGUAL TABLET: 33 days supply | Qty: 100 | Fill #0 | Status: AC
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
## 2019-05-30 MED FILL — PRALUENT PEN 150 MG/ML SUBCUTANEOUS PEN INJECTOR: 56 days supply | Qty: 4 | Fill #6 | Status: AC

## 2019-05-30 MED FILL — PRALUENT PEN 150 MG/ML SUBCUTANEOUS PEN INJECTOR: SUBCUTANEOUS | 56 days supply | Qty: 4 | Fill #6

## 2019-06-03 MED FILL — LEVEMIR FLEXTOUCH U-100 INSULIN 100 UNIT/ML (3 ML) SUBCUTANEOUS PEN: 37 days supply | Qty: 15 | Fill #0 | Status: AC

## 2019-06-06 MED ORDER — LOPERAMIDE 2 MG CAPSULE
ORAL_CAPSULE | Freq: Four times a day (QID) | ORAL | 0 refills | 8 days | Status: CP | PRN
Start: 2019-06-06 — End: ?
  Filled 2019-06-08: qty 30, 8d supply, fill #0

## 2019-06-06 NOTE — Unmapped (Signed)
Tramadol has not been prescribed by this clinic and not active on his med list.  I think he has been getting here and there from inpatient providers.  I will not refill this medication.    Zofran I can't refill. He needs a repeat EKG to evaluate his QTc.  That last one I see if from 04/01/2019 and QTc was long then.  Would be harmful especially given his underlying heart disease and known arrhythmias.    Imodium I refilled.      He had an appt with me recently and did not show. He does not have another appt until 06/29/19 with me.     I was the supervising physician in the delivery of the service. Kurtis Bushman, MD

## 2019-06-06 NOTE — Unmapped (Signed)
F/u attestation:    Tramadol has not been prescribed by this clinic and not active on his med list.  I think he has been getting here and there from inpatient providers.  I will not refill this medication.  ??  Zofran I can't refill. He needs a repeat EKG to evaluate his QTc.  That last one I see if from 04/01/2019 and QTc was long then.  Would be harmful especially given his underlying heart disease and known arrhythmias.  ??  Imodium I refilled.    ??  He had an appt with me recently and did not show. He does not have another appt until 06/29/19 with me.

## 2019-06-08 MED FILL — FLUOXETINE 40 MG CAPSULE: ORAL | 90 days supply | Qty: 180 | Fill #1

## 2019-06-08 MED FILL — BRILINTA 90 MG TABLET: ORAL | 90 days supply | Qty: 180 | Fill #1

## 2019-06-08 MED FILL — ATORVASTATIN 80 MG TABLET: 90 days supply | Qty: 90 | Fill #1 | Status: AC

## 2019-06-08 MED FILL — AMIODARONE 200 MG TABLET: ORAL | 90 days supply | Qty: 90 | Fill #1

## 2019-06-08 MED FILL — XARELTO 15 MG TABLET: 90 days supply | Qty: 90 | Fill #1 | Status: AC

## 2019-06-08 MED FILL — SPS (WITH SORBITOL) 15 GRAM-20 GRAM/60 ML ORAL SUSPENSION: ORAL | 60 days supply | Qty: 473 | Fill #2

## 2019-06-08 MED FILL — ACETAMINOPHEN 500 MG TABLET: 30 days supply | Qty: 180 | Fill #2

## 2019-06-08 MED FILL — BRILINTA 90 MG TABLET: 90 days supply | Qty: 180 | Fill #1 | Status: AC

## 2019-06-08 MED FILL — SPS (WITH SORBITOL) 15 GRAM-20 GRAM/60 ML ORAL SUSPENSION: 60 days supply | Qty: 473 | Fill #2 | Status: AC

## 2019-06-08 MED FILL — XARELTO 15 MG TABLET: ORAL | 90 days supply | Qty: 90 | Fill #1

## 2019-06-08 MED FILL — ACETAMINOPHEN 500 MG TABLET: 30 days supply | Qty: 180 | Fill #2 | Status: AC

## 2019-06-08 MED FILL — ATORVASTATIN 80 MG TABLET: ORAL | 90 days supply | Qty: 90 | Fill #1

## 2019-06-08 MED FILL — AMIODARONE 200 MG TABLET: 90 days supply | Qty: 90 | Fill #1 | Status: AC

## 2019-06-08 MED FILL — FLUOXETINE 40 MG CAPSULE: 90 days supply | Qty: 180 | Fill #1 | Status: AC

## 2019-06-08 MED FILL — LOPERAMIDE 2 MG CAPSULE: 8 days supply | Qty: 30 | Fill #0 | Status: AC

## 2019-06-15 NOTE — Unmapped (Deleted)
Rheumatology Clinic Follow-up Visit Note     Assessment and Plan: Jeremy Oliver is a 49 y.o.  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  He denies any recent flares and does not have tophi on exam. His uric acid is above goal, most recently at 8.2, and he is on low-dose allopurinol which we discussed increasing. He was encouraged to reach out with any flare symptoms as history only antiinflammatory option is Prednisone given degree of CKD.     Chronic gout, and knee osteoarthritis:   - Allopurinol 150mg  daily   - Repeat uric acid   - Encouraged to call if he has a symptomatic flare as he is not able to be on prophylactic anti-inflammatory medications due to CKD and diabetes.  Would consider prednisone in the setting of a flare as he can titrate his insulin.    CKD Stage 4-5:   - Follows with nephrology, the patient is unsure about timing of dialysis    No follow-ups on file.     Patient was seen and discussed with Dr. Janae Bridgeman, MD  Rheumatology Fellow PGY5  Pager [phone]    HPI: Jeremy Oliver is a 49 y.o.  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     Disease History:   His wife notes he has had a knee aspiration at [facility] previously and had gout crystals detected.  He has been on allopurinol and colchicine in the past, the colchicine has been stopped due to progressive renal dysfunction.  She notes that he has had attacks in most all joints in his body.  During the attacks he has severe pain with swelling that causes him to cry.  Most recently he has not had any obvious attacks but has had chronic knee and ankle pain, as well as chronic pain in his lower extremities.  He also has had lower extremity edema that has improved with Bumex.  He has not had any chronic neck or back symptoms.      Interval History: He was last seen in Sept 2020. Uric acid was ordered but not done.     ROS: 14 systems were reviewed and are negative except for that mentioned in the HPI.     Allergies: Losartan  Immunizations:   Immunization History   Administered Date(s) Administered   ??? INFLUENZA TIV (TRI) PF (IM) 12/09/2007, 12/27/2008, 01/09/2011   ??? Influenza Vaccine Quad (IIV4 PF) 44mo+ injectable 01/09/2012, 12/06/2012, 03/02/2014, 12/21/2014, 12/05/2015, 12/09/2016, 12/04/2017, 12/10/2018   ??? Novel Influenza-h1n1-09, All Formulations 03/13/2008   ??? PNEUMOCOCCAL POLYSACCHARIDE 23 02/10/2007   ??? TdaP 08/30/2009      PMHx:   Past Medical History:   Diagnosis Date   ??? Angina at rest (CMS-[name])    ??? Arthritis    ??? Asthma    ??? Atrial fibrillation and flutter (CMS-[name]) 06/08/14   ??? BMI 40.0-44.9, adult (CMS-[name]) 10/29/2015   ??? Chronic anticoagulation 02/17/2018   ???  Chronic kidney disease, stage 3    ??? Chronic pain syndrome 04/11/2014    ~Use daily lyrica as recommended ~Tramadol as needed for pain ~Fluoxetine daily as recommended ~Daily exercise ~Eat a healthy diet ~Good control of your blood sugars can help    ??? Coronary artery disease    ??? Depression    ??? Diabetes mellitus (CMS-[name])    ??? Eye trauma 1998    glass in both eyes and removed   ??? GERD (gastroesophageal reflux disease)    ??? Gout    ??? Homeless    ??? Hyperlipidemia    ??? Hypertension    ??? Illiteracy and low-level literacy    ??? Microscopic hematuria    ??? Migraine    ??? Nephrolithiasis    ??? 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 Nonalcoholic fatty liver disease    ??? NSTEMI (non-ST elevated myocardial infarction) (CMS-[name]) 01/13/2017   ??? Obesity    ??? Obstructive sleep apnea 2009   ??? Panic attacks    ??? 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 Seizure-like activity (CMS-[name])     ~Monitor for symptoms and report them to your primary care provider if they occur    ??? Systolic CHF, chronic (CMS-[name])     EF 40-45% 2017   ??? TIA (transient ischemic attack)      PSHx:   Past Surgical History:   Procedure Laterality Date   ??? CARDIAC CATHETERIZATION  03/08/2007   ??? CORONARY STENT PLACEMENT     ??? CYSTOURETHROSCOPY  12/31/2009     Microscopic hematuria   ??? KNEE SURGERY Right 09/23/2006    arthroscopic knee surgery medial and lateral meniscus tears as well as crystal disease   ??? PR CATH PLACE/CORON ANGIO, IMG SUPER/INTERP,R&L HRT CATH, L HRT VENTRIC N/A 02/13/2017    Procedure: Left/Right Heart Catheterization;  Surgeon: Marlaine Hind, MD;  Location: [name] CATH;  Service: Cardiology   ??? PR CATH PLACE/CORON ANGIO, IMG SUPER/INTERP,W LEFT HEART VENTRICULOGRAPHY N/A 03/17/2017    Procedure: Left Heart Catheterization W Intervention;  Surgeon: Marlaine Hind, MD;  Location: [facility] CATH;  Service: Cardiology   ??? PR CATH PLACE/CORON ANGIO, IMG SUPER/INTERP,W LEFT HEART VENTRICULOGRAPHY N/A 05/12/2018    Procedure: CATH LEFT HEART CATHETERIZATION W INTERVENTION;  Surgeon: Dorathy Kinsman, MD;  Location: [facility] CATH;  Service: Cardiology   ??? PR CATH PLACE/CORON ANGIO, IMG SUPER/INTERP,W LEFT HEART VENTRICULOGRAPHY N/A 10/22/2018    Procedure: CATH LEFT HEART CATHETERIZATION W INTERVENTION;  Surgeon: Marlaine Hind, MD;  Location: [facility] CATH;  Service: Cardiology   ??? PR ECMO/ECLS INITIATION VENO-ARTERIAL N/A 03/19/2017    Procedure: ECMO/ECLS; INITIATION, VENO-ARTERIAL;  Surgeon: Lennie Odor, MD;  Location: MAIN OR [facility];  Service: Cardiac Surgery   ??? PR ECMO/ECLS RMVL PRPH CANNULA OPEN 6 YRS & OLDER Left 03/25/2017    Procedure: ECMO / ECLS PROVIDED BY PHYSICIAN; REMOVAL OF PERIPHERAL (ARTERIAL AND/OR VENOUS) CANNULA(E), OPEN, 6 YEARS AND OLDER;  Surgeon: Alonna Buckler Ikonomidis, MD;  Location: MAIN OR [facility];  Service: Cardiac Surgery   ??? PR EPHYS EVAL W/  ABLATION SUPRAVENT ARRHYTHMIA N/A 07/19/2015    Catheter ablation of cavotricuspid isthmus for atrial flutter [facility])   ??? PR INSER HART PACER XVENOUS ATRIAL N/A 05/30/2015    [employer] dual-chamber pacemaker implant (E.Chung)   ??? PR INSERT/PLACE FLOW DIRECT CATH N/A 03/17/2017    Procedure: Insert Leave In [place];  Surgeon: Marlaine Hind, MD;  Location: [facility] CATH;  Service: Cardiology   ??? PR INSJ/RPLCMT PERM DFB W/TRNSVNS LDS 1/DUAL CHMBR N/A 05/04/2017    Procedure: ICD Implant System (Single/Dual);  Surgeon: Eldred Manges, MD;  Location: [facility] CATH;  Service: Cardiology   ??? PR NEGATIVE PRESSURE WOUND THERAPY DME >50 SQ CM Left 03/25/2017    Procedure: Neg Press Wound Tx (Vac Assist) Incl Topicals, Per Session, Tsa Greater Than/= 50 Cm Squared;  Surgeon: Alonna Buckler Ikonomidis, MD;  Location: MAIN OR [facility];  Service: Cardiac Surgery   ??? PR PRQ TRLUML CORONARY STENT W/ANGIO ONE ART/BRNCH N/A 03/12/2018    Procedure: Percutaneous Coronary Intervention;  Surgeon: Marlaine Hind, MD;  Location: [facility] CATH;  Service: Cardiology   ??? PR REBL VES DIRECT,LOW EXTREM Left 03/19/2017    Procedure: Repr Bld Vessel Direct; Lower Extrem;  Surgeon: Boykin Reaper, MD;  Location: MAIN OR [facility];  Service: Vascular   ??? PR REMV ART CLOT ILIAC-POP,LEG INCIS Left 03/25/2017    Procedure: EMBOLECTOMY OR THROMBECTOMY, WITH OR WITHOUT CATHETER; FEMOROPOPLITEAL, AORTOILIAC ARTERY, BY LEG INCISION;  Surgeon: Alonna Buckler Ikonomidis, MD;  Location: MAIN OR [facility];  Service: Cardiac Surgery   ??? PR UPPER GI ENDOSCOPY,BIOPSY N/A 02/28/2016    Procedure: UGI ENDOSCOPY; WITH BIOPSY, SINGLE OR MULTIPLE;  Surgeon: Liane Comber, MD;  [facility]: [facility];  Service: Gastroenterology   ??? PR UPPER GI ENDOSCOPY,DIAGNOSIS N/A 05/07/2016    Procedure: UGI ENDO, INCLUDE ESOPHAGUS, STOMACH, & DUODENUM &/OR JEJUNUM; DX W/WO COLLECTION SPECIMN, BY BRUSH OR WASH;  Surgeon: Modena Nunnery, MD;  Location: [facility];  Service: Gastroenterology     Family Hx:   Family History   Problem Relation Age of Onset   ??? Diabetes Mother    ??? Hyperlipidemia Mother    ??? Heart disease Mother    ??? Basal cell carcinoma Mother    ??? Blindness Mother         diabeties   ??? Obesity Mother    ??? Hyperlipidemia Father    ??? Heart disease Father    ??? Lung disease Father    ??? Heart disease Half-Sister    ??? Kidney disease Half-Sister    ??? Gallbladder disease Half-Brother    ??? Obesity Half-Brother    ??? No Known Problems Daughter    ??? No Known Problems Son    ??? Obesity Daughter    ??? Melanoma Neg Hx    ??? Squamous cell carcinoma Neg Hx    ??? Macular degeneration Neg Hx    ??? Glaucoma Neg Hx      Social Hx:   Social History     Socioeconomic History   ??? Marital status: Married     Spouse name: [name]   ??? Number of children: 3   ??? Years of education: 62   ??? Highest education level: Not on file   Occupational History   ??? Occupation: DISABLED     Comment: Former Surveyor, quantity Needs   ??? Financial resource strain: Very hard   ??? Food insecurity     Worry: Sometimes true     Inability:  Sometimes true   ??? Transportation needs     Medical: Yes     Non-medical: Yes   Tobacco Use   ??? Smoking status: Never Smoker   ??? Smokeless tobacco: Current User     Types: Chew   ??? Tobacco comment: Pt in process of reducing tobacco use,  dips 1 can every 2 weeks,   Substance and Sexual Activity   ??? Alcohol use: Never     Alcohol/week: 0.0 standard drinks     Frequency: Never Comment: rare use: couple times a year, small amount   ??? Drug use: No   ??? Sexual activity: Yes     Partners: Female   Lifestyle   ??? Physical activity     Days per week: Not on file     Minutes per session: Not on file   ??? Stress: Not on file   Relationships   ??? Social Wellsite geologist on phone: More than three times a week     Gets together: More than three times a week     Attends religious service: 1 to 4 times per year     Active member of club or organization: No     Attends meetings of clubs or organizations: Never     Relationship status: Married   Other Topics Concern   ??? Do you use sunscreen? No   ??? Tanning bed use? No   ??? Are you easily burned? No   ??? Excessive sun exposure? No   ??? Blistering sunburns? No   Social History Narrative    12/01/18: pt living with wife and friends. Friends have two young children. [name] states soon they will have their own place, however doesn't know when. Pt states it will be after the neighbors are evicted, we can move in. Pt will need to start dialysis soon. Pt and wife Not talking with daughter and grandson lately.             Information updated:  01/08/16. Reviewed 08/12/17:        Born in [place]    Raised with both parents    Parents died w/in 6 mos of each other. His parents knew they were going to pass and told him just before.    Half sister and 2 half brothers        Married 30 yrs. Wife [name]    2 other children son and daughter, adopted out    Daughter Bridgette, bf DJ        Education - completed through 11th. 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    Former Presenter, broadcasting since age 55    Lives with wife, difficulty paying bills 12/30/17     Lives next door to daughter, husband and their grandson        05/19/18: pt moving to new home in next 5 days- after an eviction. Has Medicaid assistance again. Will be w/ wife [name], they babysit their grandson often.    06/14/18: Pt and husband living with daughter while new location to live is determined.       Medications:   Current Outpatient Medications   Medication Sig Dispense Refill   ??? acetaminophen (TYLENOL) 500 MG tablet TAKE 2 TABLETS (1 000MG ) BY MOUTH EVERY 8  HOURS AS NEEDED FOR PAIN 180 each 3   ??? albuterol HFA 90 mcg/actuation inhaler Inhale 2 puffs into the lungs every 6 (six) hours as needed for wheezing or shortness of breath. 18 g 2   ??? alirocumab (PRALUENT) 150 mg/mL subcutaneous injection Inject 1 mL (150 mg total) under the skin every fourteen (14) days. 4 Syringe 11   ??? allopurinoL (ZYLOPRIM) 100 MG tablet Take 1.5 tablets (150 mg total) by mouth daily. 60 tablet 3   ??? amiodarone (PACERONE) 200 MG tablet TAKE 1 TABLET BY MOUTH ONCE DAILY 90 tablet 3   ??? amLODIPine (NORVASC) 10 MG tablet Take 1 tablet (10 mg total) by mouth daily. 90 tablet 3   ??? atorvastatin (LIPITOR) 80 MG tablet Take 1 tablet (80 mg total) by mouth daily. 90 tablet 3   ??? blood sugar diagnostic Strp Disp to match current ins approved meter. Use to test blood sugar four times daily. E11.22, Z79.4 400 each 3   ??? blood-glucose meter kit Disp. blood glucose meter kit preferred by patient's insurance. Dx: Diabetes, E11.9 1 each 11   ??? bumetanide (BUMEX) 2 MG tablet Take 1 tablet (2 mg total) by mouth two (2) times a day. 180 tablet 3   ??? carvediloL (COREG) 25 MG tablet Take 1.5 tablets (37.5 mg total) by mouth Two (2) times a day. 270 tablet 3   ??? cholecalciferol, vitamin D3, 5,000 unit capsule Take 1 capsule (5,000 Units total) by mouth daily. 100 capsule 0   ??? diclofenac sodium (VOLTAREN) 1 % gel Apply 2 grams topically Three (3) times a day. 100 g 11   ??? empty container Misc Use as directed to dispose of injectable medications 1 each 3   ??? ezetimibe (ZETIA) 10 mg tablet TAKE 1 TABLET BY MOUTH ONCE DAILY 90 each 3   ??? FLUoxetine (PROZAC) 40 MG capsule Take 2 capsules (80 mg total) by mouth daily. 180 capsule 3   ??? gabapentin (NEURONTIN) 300 MG capsule Take 1 capsule (300 mg total) by mouth two (2) times a day. 180 capsule 3   ??? hydrALAZINE (APRESOLINE) 25 MG tablet Take 1 tablet (25 mg total) by mouth daily. 90 tablet 3   ??? insulin ASPART (NOVOLOG FLEXPEN U-100 INSULIN) 100 unit/mL (3 mL) injection pen Inject 20 units under the skin three (3) times a day with a meal. 15 mL 11   ??? insulin detemir U-100 (LEVEMIR) 100 unit/mL (3 mL) injection pen Inject 40 Units total under the skin nightly. 15 mL 3   ??? insulin syringe-needle U-100 0.3 mL 31 gauge x 5/16 Syrg Use to inject insulin twice daily with meals 60 each 0   ??? isosorbide mononitrate (IMDUR) 30 MG 24 hr tablet Take 4 tablets (120 mg total) by mouth daily. 120 tablet 3   ??? lancets Misc Disp. Ins preferred lancets. Use to test blood sugar four times daily. E11.22, Z79.4 400 each 3   ??? loperamide (IMODIUM) 2 mg capsule Take 1 capsule (2 mg total) by mouth 4 (four) times a day as needed for diarrhea. 30 capsule 0   ??? miscellaneous medical supply (BLOOD PRESSURE CUFF) Misc Measure BP once a day or if any symptoms 1 each 0   ??? nitroglycerin (NITROSTAT) 0.4 MG SL tablet Place 1 tablet (0.4 mg total) under the tongue every five (5) minutes as needed for chest pain. 100 each 3   ??? omeprazole (PRILOSEC) 40 MG capsule Take 1 capsule (40 mg total) by mouth Two (2)  times a day (30 minutes before a meal). 180 capsule 3   ??? ondansetron (ZOFRAN) 4 MG tablet Take 1 tablet (4 mg total) by mouth every eight (8) hours as needed for nausea for up to 4 days. 10 tablet 0   ??? ondansetron (ZOFRAN) 4 MG tablet Take 1 tablet (4 mg total) by mouth every eight (8) hours as needed for nausea. 30 tablet 0   ??? rivaroxaban (XARELTO) 15 mg Tab Take 1 tablet (15 mg total) by mouth daily with evening meal. 90 tablet 3   ??? semaglutide 0.25 mg or 0.5 mg(2 mg/1.5 mL) PnIj Inject 0.5 mg under the skin every seven (7) days. 4.5 mL 3   ??? sevelamer (RENVELA) 800 mg tablet TAKE 2 TABLETS (1600MG ) BY MOUTH THREE TIMES DAILY WITH MEALS 540 tablet 3   ??? sodium polystyrene, SPS, with sorbitol (SPS, WITH SORBITOL,) 15-20 gram/60 mL Susp Take 60 mL (15 g) by mouth once a week. 473 mL 3   ??? ticagrelor (BRILINTA) 90 mg Tab TAKE 1 TABLET BY MOUTH TWICE DAILY 180 tablet 3   ??? UNIFINE PENTIPS 31 gauge x 5/16 (8 mm) Ndle USE WITH INSULIN AND VICTOZA 5 TIMES DAILY 400 each 3     No current facility-administered medications for this visit.        Physical Exam: Limited due to video exam  GEN:  Chronically ill appearing.   Chest: No distress on room air  Cardiovascular:  2+ distal pulses equal bilaterally.  GI:  Soft, NT, ND, normal bowel sounds.  No organomegaly.  No abdominal bruit.  MSK:     Hands: No obvious swelling, able to make a fist bilaterally.   Wrists: Nearly normal range of motion, no tenderness or swelling.    Elbows: Nearly normal ROM, no tophi.    Knees: Trace bilateral effusions without erythema, normal ROM.    Ankles: Bilateral soft tissue swelling.   Neuro:  Awake and alert. Inability to dorsiflex left foot. Bilateral 4-5th digit numbness.   Skin:  Several healed 1cm patches.   Psych: Normal mood and affect.     Labs:    Lab Results   Component Value Date    WBC 9.4 04/01/2019    HGB 11.8 (L) 04/01/2019    HCT 35.3 (L) 04/01/2019    PLT 260 04/01/2019     Lab Results   Component Value Date    NA 135 04/14/2019    K 5.0 04/14/2019    CL 104 04/14/2019    CO2 27.0 04/14/2019    BUN 38 (H) 04/14/2019    CREATININE 4.70 (H) 04/14/2019    GLU 164 04/14/2019    CALCIUM 8.6 04/14/2019    CALCIUM 8.6 04/14/2019    MG 1.9 02/02/2019    PHOS 4.0 04/14/2019     Lab Results   Component Value Date    BILITOT 0.3 04/01/2019    BILIDIR 0.30 01/20/2019    PROT 5.7 (L) 04/01/2019    ALBUMIN 3.1 (L) 04/14/2019    ALT 11 04/01/2019    AST 12 (L) 04/01/2019    ALKPHOS 89 04/01/2019    GGT 32 01/09/2012     URIC ACID 09/20/18: 8.2    Imaging:     R Knee xray 06/2018: No fracture. No osteolytic or osteoblastic lesions. No erosions. Normal alignment. There is tricompartmental osteophytosis. Mild medial compartment predominant joint space narrowing. Patellar and tibial tuberosity enthesophytosis. There is diffuse soft tissue swelling about the knee. No dissecting soft  tissue gas is noted. There is a moderate right knee effusion.    05/2018:  R Elbow: No acute fracture or dislocation.  Chronic common flexor tendinosis  Mild posterior forearm soft tissue swelling Folden reflect contusion.

## 2019-06-20 MED FILL — VENTOLIN HFA 90 MCG/ACTUATION AEROSOL INHALER: RESPIRATORY_TRACT | 25 days supply | Qty: 18 | Fill #1

## 2019-06-20 MED FILL — NOVOLOG FLEXPEN U-100 INSULIN ASPART 100 UNIT/ML (3 ML) SUBCUTANEOUS: 25 days supply | Qty: 15 | Fill #6 | Status: AC

## 2019-06-20 MED FILL — NOVOLOG FLEXPEN U-100 INSULIN ASPART 100 UNIT/ML (3 ML) SUBCUTANEOUS: SUBCUTANEOUS | 25 days supply | Qty: 15 | Fill #6

## 2019-06-20 MED FILL — VENTOLIN HFA 90 MCG/ACTUATION AEROSOL INHALER: 25 days supply | Qty: 18 | Fill #1 | Status: AC

## 2019-06-21 DIAGNOSIS — I10 Essential (primary) hypertension: Principal | ICD-10-CM

## 2019-06-21 DIAGNOSIS — I25118 Atherosclerotic heart disease of native coronary artery with other forms of angina pectoris: Principal | ICD-10-CM

## 2019-06-21 DIAGNOSIS — R55 Syncope and collapse: Principal | ICD-10-CM

## 2019-06-21 MED ORDER — DICLOFENAC 1 % TOPICAL GEL
Freq: Three times a day (TID) | TOPICAL | 11 refills | 30.00000 days | Status: CP
Start: 2019-06-21 — End: 2020-06-20

## 2019-06-21 NOTE — Unmapped (Signed)
CCM Follow-up Call    PCP Action: pt requests refill of loperamide and refill ondansetron .     CCM Action:follow up GI referral. Pt was seen 05/09/19 and follow up note states to return PRN. Pt per protocol sent voltaren rx to shared pharmacy for possible manufacturer's assistance. Pt state $10 tube too expensive.    Patient Action:following up re: GI . Pt did call GI today and they said he is not due a visit until [place]. Pt will call back today to discuss follow up re: diarrhea and emesis.      Follow up/Discuss next call: diarrhea, emesis GI appt?    Pt states unsure why needs to go back to GI  However will call [phone] to reschedule. States nephrology and cardiology have told him he will not tolerate any procedures for anesthesia d/t CKD and/or heart.      Pt states wife not doing well, has been taking diuretics, shortness of breath and post surgical knee complications.      Pt states overall doing well today.    Pt states did have emesis x1 this morning.  Pt is out of loperamide rx 30 tabs from 06/06/19.  Pt continues to have diarrhea. Dx @ last GI visit gastroparesis.   CCM asked pt to schedule f/u with GI as diarrhea and emesis have become chronic.    Spoke to wife briefly, states still taking all medication for heart.  Pt states in the morning and pm BP is fine but often in the middle of the day BP 154- 210/62-105. HR 80s.  Pt is NOT taking hydralazine anymore, stopped  a long time ago d/t hypotension.  Pt is taking: isosorbide, coreg, bumex, amiodarone, amlodipine.  Pt will keep list of BPs prior to appt at end of this month and discuss BP meds.

## 2019-06-22 DIAGNOSIS — R69 Illness, unspecified: Principal | ICD-10-CM

## 2019-06-23 NOTE — Unmapped (Signed)
Rheumatology Clinic Follow-up Visit Note     Assessment and Plan: Jeremy Oliver is a 49 y.o.  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  He denies any recent flares and does not have tophi on exam. His uric acid is above goal, most recently at 8.2, and he is on low-dose allopurinol which we discussed increasing. He was encouraged to reach out with any flare symptoms as history only antiinflammatory option is Prednisone given degree of CKD.    His bigger issue today is progressive edema and shortness of breath, related to his renal disease and heart failure. He is up 20 lbs in a month. We discussed repeating a BMP today and talking with his nephrologist and cardiologist about adjusting diuretics. Based on appearance in clinic he does not have acute indications for hospitalization but this will depend on BMP. He was counseled to go to the ER if shortness of breath progresses.     Chronic gout, and knee osteoarthritis:   - Increase to Allopurinol 200mg  daily   - Repeat uric acid   - Encouraged to call if he has a symptomatic flare as he is not able to be on prophylactic anti-inflammatory medications due to CKD and diabetes.  Would consider prednisone in the setting of a flare as he can titrate his insulin.    Volume overload in the setting of CKD Stage 5 and CHF:   - Check BMP today   - We have messaged his [facility] nephrologist and cardiologist to help with diuretic titration.   - Counseled to go to the ER if shortness of breath progresses      Return in about 6 months (around 12/28/2019).     Patient was seen and discussed with Dr. Governor Specking, MD  Rheumatology Fellow PGY5  Pager [phone]    HPI: Jeremy Ash Laplant is a 49 y.o.  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     Disease History:   His wife notes he has had a knee aspiration at [facility] previously and had gout crystals detected.  He has been on allopurinol and colchicine in the past, the colchicine has been stopped due to progressive renal dysfunction.  She notes that he has had attacks in most all joints in his body.  During the attacks he has severe pain with swelling that causes him to cry.  Most recently he has not had any obvious attacks but has had chronic knee and ankle pain, as well as chronic pain in his lower extremities.  He also has had lower extremity edema that has improved with Bumex.  He has not had any chronic neck or back symptoms.      Interval History: He was last seen in Sept 2020. Uric acid was ordered but not done. Today, he reports whole body swelling. His weight has increased from 357 pounds to 385 pounds. He says he is peeing very well on the bumex. He  gets chest pain and shortness of breath when walking. He has 4 pillow orthopnea. He says he has not had a gout flare in the past 6 months, involving his right knee and ankle. He is on allopurinol 150mg  daily.     ROS: 14 systems were reviewed and are negative except for that mentioned in the HPI.     Allergies: Losartan and Opioids - morphine analogues     Immunizations:   Immunization History   Administered Date(s) Administered   ??? INFLUENZA TIV (TRI) PF (IM) 12/09/2007, 12/27/2008, 01/09/2011   ??? Influenza Vaccine Quad (IIV4 PF) 75mo+ injectable 01/09/2012, 12/06/2012, 03/02/2014, 12/21/2014, 12/05/2015, 12/09/2016, 12/04/2017, 12/10/2018   ??? Novel Influenza-h1n1-09, All Formulations 03/13/2008   ??? PNEUMOCOCCAL POLYSACCHARIDE 23 02/10/2007   ??? TdaP 08/30/2009      PMHx:   Past Medical History:   Diagnosis Date   ??? Angina at rest (CMS-[name])    ??? Arthritis    ??? Asthma    ??? Atrial fibrillation and flutter (CMS-[name]) 06/08/14   ??? BMI 40.0-44.9, adult (CMS-[name]) 10/29/2015   ??? Chronic anticoagulation 02/17/2018   ??? Chronic kidney disease, stage 3    ??? Chronic pain syndrome 04/11/2014    ~Use daily lyrica as recommended ~Tramadol as needed for pain ~Fluoxetine daily as recommended ~Daily exercise ~Eat a healthy diet ~Good control of your blood sugars can help    ??? Coronary artery disease    ??? Depression    ??? Diabetes mellitus (CMS-[name])    ??? Eye trauma 1998    glass in both eyes and removed   ??? GERD (gastroesophageal reflux disease)    ??? Gout    ??? Homeless    ??? Hyperlipidemia    ??? Hypertension    ??? Illiteracy and low-level literacy    ??? Microscopic hematuria    ??? Migraine    ??? Nephrolithiasis    ??? 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 Nonalcoholic fatty liver disease    ??? NSTEMI (non-ST elevated myocardial infarction) (CMS-[name]) 01/13/2017   ??? Obesity    ??? Obstructive sleep apnea 2009   ??? Panic attacks    ??? 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 Seizure-like activity (CMS-[name])     ~Monitor for symptoms and report them to your primary care provider if they occur    ??? Systolic CHF, chronic (CMS-[name])     EF 40-45% 2017   ??? TIA (transient ischemic attack)      PSHx:   Past Surgical History:   Procedure Laterality Date   ??? CARDIAC CATHETERIZATION  03/08/2007   ??? CORONARY STENT PLACEMENT     ??? CYSTOURETHROSCOPY  12/31/2009     Microscopic hematuria   ??? KNEE SURGERY Right 09/23/2006 arthroscopic knee surgery medial and lateral meniscus tears as well as crystal disease   ??? PR CATH PLACE/CORON ANGIO, IMG SUPER/INTERP,R&L HRT CATH, L HRT VENTRIC N/A 02/13/2017    Procedure: Left/Right Heart Catheterization;  Surgeon: Marlaine Hind, MD;  Location: [facility] CATH;  Service: Cardiology   ??? PR CATH PLACE/CORON ANGIO, IMG SUPER/INTERP,W LEFT HEART  VENTRICULOGRAPHY N/A 03/17/2017    Procedure: Left Heart Catheterization W Intervention;  Surgeon: Marlaine Hind, MD;  Location: [facility] CATH;  Service: Cardiology   ??? PR CATH PLACE/CORON ANGIO, IMG SUPER/INTERP,W LEFT HEART VENTRICULOGRAPHY N/A 05/12/2018    Procedure: CATH LEFT HEART CATHETERIZATION W INTERVENTION;  Surgeon: Dorathy Kinsman, MD;  Location: [facility] CATH;  Service: Cardiology   ??? PR CATH PLACE/CORON ANGIO, IMG SUPER/INTERP,W LEFT HEART VENTRICULOGRAPHY N/A 10/22/2018    Procedure: CATH LEFT HEART CATHETERIZATION W INTERVENTION;  Surgeon: Marlaine Hind, MD;  Location: [facility] CATH;  Service: Cardiology   ??? PR ECMO/ECLS INITIATION VENO-ARTERIAL N/A 03/19/2017    Procedure: ECMO/ECLS; INITIATION, VENO-ARTERIAL;  Surgeon: Lennie Odor, MD;  Location: MAIN OR [facility];  Service: Cardiac Surgery   ??? PR ECMO/ECLS RMVL PRPH CANNULA OPEN 6 YRS & OLDER Left 03/25/2017    Procedure: ECMO / ECLS PROVIDED BY PHYSICIAN; REMOVAL OF PERIPHERAL (ARTERIAL AND/OR VENOUS) CANNULA(E), OPEN, 6 YEARS AND OLDER;  Surgeon: Alonna Buckler Ikonomidis, MD;  Location: MAIN OR [facility];  Service: Cardiac Surgery   ??? PR EPHYS EVAL W/ ABLATION SUPRAVENT ARRHYTHMIA N/A 07/19/2015    Catheter ablation of cavotricuspid isthmus for atrial flutter [facility])   ??? PR INSER HART PACER XVENOUS ATRIAL N/A 05/30/2015    [employer] dual-chamber pacemaker implant (E.Chung)   ??? PR INSERT/PLACE FLOW DIRECT CATH N/A 03/17/2017    Procedure: Insert Leave In [place];  Surgeon: Marlaine Hind, MD;  Location: [facility] CATH;  Service: Cardiology   ??? PR INSJ/RPLCMT PERM DFB W/TRNSVNS LDS 1/DUAL CHMBR N/A 05/04/2017    Procedure: ICD Implant System (Single/Dual);  Surgeon: Eldred Manges, MD;  Location: [facility] CATH;  Service: Cardiology   ??? PR NEGATIVE PRESSURE WOUND THERAPY DME >50 SQ CM Left 03/25/2017    Procedure: Neg Press Wound Tx (Vac Assist) Incl Topicals, Per Session, Tsa Greater Than/= 50 Cm Squared;  Surgeon: Alonna Buckler Ikonomidis, MD;  Location: MAIN OR [facility];  Service: Cardiac Surgery   ??? PR PRQ TRLUML CORONARY STENT W/ANGIO ONE ART/BRNCH N/A 03/12/2018    Procedure: Percutaneous Coronary Intervention;  Surgeon: Marlaine Hind, MD;  Location: [facility] CATH;  Service: Cardiology   ??? PR REBL VES DIRECT,LOW EXTREM Left 03/19/2017    Procedure: Repr Bld Vessel Direct; Lower Extrem;  Surgeon: Boykin Reaper, MD;  Location: MAIN OR [facility];  Service: Vascular   ??? PR REMV ART CLOT ILIAC-POP,LEG INCIS Left 03/25/2017    Procedure: EMBOLECTOMY OR THROMBECTOMY, WITH OR WITHOUT CATHETER; FEMOROPOPLITEAL, AORTOILIAC ARTERY, BY LEG INCISION;  Surgeon: Alonna Buckler Ikonomidis, MD;  Location: MAIN OR [facility];  Service: Cardiac Surgery   ??? PR UPPER GI ENDOSCOPY,BIOPSY N/A 02/28/2016    Procedure: UGI ENDOSCOPY; WITH BIOPSY, SINGLE OR MULTIPLE;  Surgeon: Liane Comber, MD;  [facility];  Service: Gastroenterology   ??? PR UPPER GI ENDOSCOPY,DIAGNOSIS N/A 05/07/2016    Procedure: UGI ENDO, INCLUDE ESOPHAGUS, STOMACH, & DUODENUM &/OR JEJUNUM; DX W/WO COLLECTION SPECIMN, BY BRUSH OR WASH;  Surgeon: Modena Nunnery, MD;  [facility]: [facility];  Service: Gastroenterology     Family Hx:   Family History   Problem Relation Age of Onset   ??? Diabetes Mother    ??? Hyperlipidemia Mother    ??? Heart disease Mother    ??? Basal cell carcinoma Mother    ??? Blindness Mother         diabeties   ??? Obesity Mother    ??? Hyperlipidemia Father    ???  Heart disease Father    ??? Lung disease Father    ??? Heart disease Half-Sister    ??? Kidney disease Half-Sister    ??? Gallbladder disease Half-Brother    ??? Obesity Half-Brother    ??? No Known Problems Daughter    ??? No Known Problems Son    ??? Obesity Daughter    ??? Melanoma Neg Hx    ??? Squamous cell carcinoma Neg Hx    ??? Macular degeneration Neg Hx    ??? Glaucoma Neg Hx      Social Hx:   Social History     Socioeconomic History   ??? Marital status: Married     Spouse name: [name]   ??? Number of children: 3   ??? Years of education: 2   ??? Highest education level: Not on file   Occupational History   ??? Occupation: DISABLED     Comment: Former Surveyor, quantity Needs   ??? Financial resource strain: Very hard   ??? Food insecurity     Worry: Sometimes true     Inability: Sometimes true   ??? Transportation needs     Medical: Yes     Non-medical: Yes   Tobacco Use   ??? Smoking status: Never Smoker   ??? Smokeless tobacco: Current User     Types: Chew   ??? Tobacco comment: Pt in process of reducing tobacco use,  dips 1 can every 2 weeks,   Substance and Sexual Activity   ??? Alcohol use: Never     Alcohol/week: 0.0 standard drinks     Frequency: Never     Comment: rare use: couple times a year, small amount   ??? Drug use: No   ??? Sexual activity: Yes     Partners: Female   Lifestyle   ??? Physical activity     Days per week: Not on file     Minutes per session: Not on file   ??? Stress: Not on file   Relationships   ??? Social Wellsite geologist on phone: More than three times a week     Gets together: More than three times a week     Attends religious service: 1 to 4 times per year     Active member of club or organization: No     Attends meetings of clubs or organizations: Never     Relationship status: Married   Other Topics Concern   ??? Do you use sunscreen? No   ??? Tanning bed use? No   ??? Are you easily burned? No   ??? Excessive sun exposure? No   ??? Blistering sunburns? No   Social History Narrative    12/01/18: pt living with wife and friends. Friends have two young children. [name] states soon they will have their own place, however doesn't know when. Pt states it will be after the neighbors are evicted, we can move in. Pt will need to start dialysis soon. Pt and wife Not talking with daughter and grandson lately.             Information updated:  01/08/16. Reviewed 08/12/17:        Born in [place]    Raised with both parents    Parents died w/in 6 mos of each other. His parents knew they were going to pass and told him just before.    Half sister and 2 half brothers        Married 30 yrs.  Wife [name]    2 other children son and daughter, adopted out    Daughter Bridgette, bf DJ        Education - completed through 11th. 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    Former Presenter, broadcasting since age 14    Lives with wife, difficulty paying bills 12/30/17     Lives next door to daughter, husband and their grandson        05/19/18: pt moving to new home in next 5 days- after an eviction. Has Medicaid assistance again. Will be w/ wife [name], they babysit their grandson often.    06/14/18: Pt and husband living with daughter while new location to live is determined.       Medications:   Current Outpatient Medications   Medication Sig Dispense Refill   ??? acetaminophen (TYLENOL) 500 MG tablet TAKE 2 TABLETS (1 000MG ) BY MOUTH EVERY 8 HOURS AS NEEDED FOR PAIN 180 each 3   ??? albuterol HFA 90 mcg/actuation inhaler Inhale 2 puffs into the lungs every 6 (six) hours as needed for wheezing or shortness of breath. 18 g 2   ??? alirocumab (PRALUENT) 150 mg/mL subcutaneous injection Inject 1 mL (150 mg total) under the skin every fourteen (14) days. 4 Syringe 11   ??? allopurinoL (ZYLOPRIM) 100 MG tablet Take 2 tablets (200 mg total) by mouth daily. 60 tablet 5   ??? amiodarone (PACERONE) 200 MG tablet TAKE 1 TABLET BY MOUTH ONCE DAILY 90 tablet 3   ??? amLODIPine (NORVASC) 10 MG tablet Take 1 tablet (10 mg total) by mouth daily. 90 tablet 3   ??? atorvastatin (LIPITOR) 80 MG tablet Take 1 tablet (80 mg total) by mouth daily. 90 tablet 3   ??? blood sugar diagnostic Strp Disp to match current ins approved meter. Use to test blood sugar four times daily. E11.22, Z79.4 400 each 3   ??? blood-glucose meter kit Disp. blood glucose meter kit preferred by patient's insurance. Dx: Diabetes, E11.9 1 each 11   ??? bumetanide (BUMEX) 2 MG tablet Take 1 tablet (2 mg total) by mouth two (2) times a day. 180 tablet 3   ??? carvediloL (COREG) 25 MG tablet Take 1.5 tablets (37.5 mg total) by mouth Two (2) times a day. 270 tablet 3   ??? cholecalciferol, vitamin D3, 5,000 unit capsule Take 1 capsule (5,000 Units total) by mouth daily. 100 capsule 0   ??? diclofenac sodium (VOLTAREN) 1 % gel Apply 2 grams topically Three (3) times a day. 180 g 11   ??? empty container Misc Use as directed to dispose of injectable medications 1 each 3   ??? ezetimibe (ZETIA) 10 mg tablet TAKE 1 TABLET BY MOUTH ONCE DAILY 90 each 3   ??? FLUoxetine (PROZAC) 40 MG capsule Take 2 capsules (80 mg total) by mouth daily. 180 capsule 3   ??? gabapentin (NEURONTIN) 300 MG capsule Take 1 capsule (300 mg total) by mouth two (2) times a day. 180 capsule 3   ??? insulin ASPART (NOVOLOG FLEXPEN U-100 INSULIN) 100 unit/mL (3 mL) injection pen Inject 20 units under the skin three (3) times a day with a meal. 15 mL 11   ??? insulin detemir U-100 (LEVEMIR) 100 unit/mL (3 mL) injection pen Inject 40 Units total under the  skin nightly. 15 mL 3   ??? insulin syringe-needle U-100 0.3 mL 31 gauge x 5/16 Syrg Use to inject insulin twice daily with meals 60 each 0   ??? isosorbide mononitrate (IMDUR) 30 MG 24 hr tablet Take 4 tablets (120 mg total) by mouth daily. 120 tablet 3   ??? lancets Misc Disp. Ins preferred lancets. Use to test blood sugar four times daily. E11.22, Z79.4 400 each 3   ??? loperamide (IMODIUM) 2 mg capsule Take 1 capsule (2 mg total) by mouth 4 (four) times a day as needed for diarrhea. 30 capsule 0   ??? miscellaneous medical supply (BLOOD PRESSURE CUFF) Misc Measure BP once a day or if any symptoms 1 each 0   ??? nitroglycerin (NITROSTAT) 0.4 MG SL tablet Place 1 tablet (0.4 mg total) under the tongue every five (5) minutes as needed for chest pain. 100 each 3   ??? omeprazole (PRILOSEC) 40 MG capsule Take 1 capsule (40 mg total) by mouth Two (2) times a day (30 minutes before a meal). 180 capsule 3   ??? ondansetron (ZOFRAN) 4 MG tablet Take 1 tablet (4 mg total) by mouth every eight (8) hours as needed for nausea for up to 4 days. 10 tablet 0   ??? ondansetron (ZOFRAN) 4 MG tablet Take 1 tablet (4 mg total) by mouth every eight (8) hours as needed for nausea. 30 tablet 0   ??? rivaroxaban (XARELTO) 15 mg Tab Take 1 tablet (15 mg total) by mouth daily with evening meal. 90 tablet 3   ??? semaglutide 0.25 mg or 0.5 mg(2 mg/1.5 mL) PnIj Inject 0.5 mg under the skin every seven (7) days. 4.5 mL 3   ??? sevelamer (RENVELA) 800 mg tablet TAKE 2 TABLETS (1600MG ) BY MOUTH THREE TIMES DAILY WITH MEALS 540 tablet 3   ??? sodium polystyrene, SPS, with sorbitol (SPS, WITH SORBITOL,) 15-20 gram/60 mL Susp Take 60 mL (15 g) by mouth once a week. 473 mL 3   ??? ticagrelor (BRILINTA) 90 mg Tab TAKE 1 TABLET BY MOUTH TWICE DAILY 180 tablet 3   ??? UNIFINE PENTIPS 31 gauge x 5/16 (8 mm) Ndle USE WITH INSULIN AND VICTOZA 5 TIMES DAILY 400 each 3   ??? hydrALAZINE (APRESOLINE) 25 MG tablet Take 1 tablet (25 mg total) by mouth daily. (Patient not taking: Reported on 06/27/2019) 90 tablet 3     No current facility-administered medications for this visit.        Physical Exam: Limited due to video exam  GEN:  Chronically ill appearing.   Chest: No distress on room air at rest. Few bibasilar crackes. SpO2 98%.   Cardiovascular:  RRR. 2+ distal pulses equal bilaterally. 1-2+ pitting edema of the arms, trace edema of the legs.   GI:  Protuberant.   MSK:     Hands/wrists: Diffuse subcutaneous edema/puffiness of the hands and arms   Elbows: Nearly normal ROM, no tophi.    Knees: Trace bilateral effusions without erythema, normal ROM. Ankles/feet: Bilateral soft tissue swelling.   Neuro:  Awake and alert. Inability to dorsiflex left foot. Bilateral 4-5th digit numbness. In a wheelchair.   Skin:  Several healed 1cm patches.   Psych: Normal mood and affect.     Labs:    Lab Results   Component Value Date    WBC 9.4 04/01/2019    HGB 11.8 (L) 04/01/2019    HCT 35.3 (L) 04/01/2019    PLT 260 04/01/2019     Lab Results  Component Value Date    NA 135 04/14/2019    K 5.0 04/14/2019    CL 104 04/14/2019    CO2 27.0 04/14/2019    BUN 38 (H) 04/14/2019    CREATININE 4.70 (H) 04/14/2019    GLU 164 04/14/2019    CALCIUM 8.6 04/14/2019    CALCIUM 8.6 04/14/2019    MG 1.9 02/02/2019    PHOS 4.0 04/14/2019     Lab Results   Component Value Date    BILITOT 0.3 04/01/2019    BILIDIR 0.30 01/20/2019    PROT 5.7 (L) 04/01/2019    ALBUMIN 3.1 (L) 04/14/2019    ALT 11 04/01/2019    AST 12 (L) 04/01/2019    ALKPHOS 89 04/01/2019    GGT 32 01/09/2012     URIC ACID 09/20/18: 8.2    Imaging:     R Knee xray 06/2018: No fracture. No osteolytic or osteoblastic lesions. No erosions. Normal alignment. There is tricompartmental osteophytosis. Mild medial compartment predominant joint space narrowing. Patellar and tibial tuberosity enthesophytosis. There is diffuse soft tissue swelling about the knee. No dissecting soft tissue gas is noted. There is a moderate right knee effusion.    05/2018:  R Elbow: No acute fracture or dislocation.  Chronic common flexor tendinosis  Mild posterior forearm soft tissue swelling Kleier reflect contusion.

## 2019-06-27 ENCOUNTER — Ambulatory Visit: Admit: 2019-06-27 | Discharge: 2019-06-27 | Payer: MEDICARE | Attending: Adult Health | Primary: Adult Health

## 2019-06-27 ENCOUNTER — Ambulatory Visit: Admit: 2019-06-27 | Discharge: 2019-06-27 | Payer: MEDICARE

## 2019-06-27 DIAGNOSIS — I2510 Atherosclerotic heart disease of native coronary artery without angina pectoris: Principal | ICD-10-CM

## 2019-06-27 DIAGNOSIS — M109 Gout, unspecified: Principal | ICD-10-CM

## 2019-06-27 DIAGNOSIS — N184 Chronic kidney disease, stage 4 (severe): Principal | ICD-10-CM

## 2019-06-27 DIAGNOSIS — E7800 Pure hypercholesterolemia, unspecified: Principal | ICD-10-CM

## 2019-06-27 DIAGNOSIS — I5042 Chronic combined systolic (congestive) and diastolic (congestive) heart failure: Principal | ICD-10-CM

## 2019-06-27 DIAGNOSIS — F3342 Major depressive disorder, recurrent, in full remission: Principal | ICD-10-CM

## 2019-06-27 LAB — BASIC METABOLIC PANEL
ANION GAP: 6 mmol/L (ref 3–11)
BLOOD UREA NITROGEN: 41 mg/dL — ABNORMAL HIGH (ref 9–23)
BUN / CREAT RATIO: 8
CALCIUM: 8.8 mg/dL (ref 8.7–10.4)
CHLORIDE: 107 mmol/L (ref 98–107)
CREATININE: 5.06 mg/dL — ABNORMAL HIGH (ref 0.60–1.10)
EGFR CKD-EPI NON-AA MALE: 12 mL/min/{1.73_m2}
GLUCOSE RANDOM: 304 mg/dL — ABNORMAL HIGH (ref 70–99)
POTASSIUM: 5 mmol/L (ref 3.5–5.1)
SODIUM: 136 mmol/L (ref 135–145)

## 2019-06-27 LAB — MAGNESIUM: Magnesium:MCnc:Pt:Ser/Plas:Qn:: 1.8

## 2019-06-27 LAB — GLUCOSE RANDOM: Glucose:MCnc:Pt:Ser/Plas:Qn:: 304 — ABNORMAL HIGH

## 2019-06-27 LAB — URIC ACID: Urate:MCnc:Pt:Ser/Plas:Qn:: 9.4 — ABNORMAL HIGH

## 2019-06-27 MED ORDER — BUMETANIDE 2 MG TABLET
ORAL_TABLET | Freq: Two times a day (BID) | ORAL | 3 refills | 90 days
Start: 2019-06-27 — End: 2020-06-26

## 2019-06-27 MED ORDER — ALIROCUMAB 150 MG/ML SUBCUTANEOUS PEN INJECTOR: 150 mg | each | 3 refills | 0 days | Status: AC

## 2019-06-27 MED ORDER — ALLOPURINOL 100 MG TABLET
ORAL_TABLET | Freq: Every day | ORAL | 5 refills | 30.00000 days | Status: CP
Start: 2019-06-27 — End: 2020-06-26
  Filled 2019-06-27: qty 60, 30d supply, fill #0

## 2019-06-27 MED ORDER — ALIROCUMAB 150 MG/ML SUBCUTANEOUS PEN INJECTOR
SUBCUTANEOUS | 3 refills | 0.00000 days | Status: CP
Start: 2019-06-27 — End: 2019-06-27
  Filled 2019-07-20: qty 6, 84d supply, fill #0

## 2019-06-27 MED FILL — ALLOPURINOL 100 MG TABLET: 30 days supply | Qty: 60 | Fill #0 | Status: AC

## 2019-06-27 NOTE — Unmapped (Signed)
[facility] Note    Primary Cardiologist/Cardiology Provider(s):  Dr Andrey Farmer  PCP:  Kurtis Bushman, MD    Last Cardiology Clinic Visit:  04/14/19  Last IV Diuresis Visit: 02/02/19    Reason for Visit:  Jeremy Oliver is a 49 y.o. male with a history of combined systolic and diastolic heart failure, who is being seen today in the [facility] IV Diuresis Clinic for an urgent visit for volume status optimization including IV diuresis.    Assessment/Plan:  1. Acute on Chronic combined systolic and diastolic heart failure, acute on chronic kidney disease  - Volume status today is worse. His weight is up ~30lbs in the last 2mos  - NYHA Class III; he has worsening DOE, LE edema, orthopnea  - IV Lasix 160 mg x2 was administered during clinic with good response  - Labs were obtained: Cr 5.06, K 5.0  - Continue Bumex 5mg  BID. If no improvement in symptoms, creatinine will plan to admit  - Recommend reducing sodium, fluid intake    2. HTN  - poorly controlled, 150/92 today  - Not taking hydralazine, says it drops his BP too low    Response to IV diuresis:          Medications Heart rate Blood pressure Weight (lbs)   Pre  102 150/92 385   Hour 0 IV Lasix 160 mg      Hour 2 IV Lasix 160 mg      Post   171/88   383       Output:  I/O       03/27 0701 - 03/28 0700 03/28 0701 - 03/29 0700 03/29 0701 - 03/30 0700    P.O.   200    Total Intake   200    Urine (mL/kg/hr)   1046    Total Output(mL/kg)   1046 (6)    Net   -846               Return to clinic:    Return in about 2 days (around 06/29/2019) for Diuresis Clinic.    Future Appointments   Date Time Provider Department Center   06/29/2019 10:50 AM Roma Schanz, MD [facility]   06/29/2019 11:00 AM DIURESIS HF NP [place] [facility]   07/04/2019 10:30 AM Mila Merry, MD [facility]   07/14/2019  3:00 PM Marlaine Hind, MD [facility]   07/26/2019  8:15 AM [facility] EP REMOTE MONITORING EPMON[facility]ORCH [facility] 08/02/2019 12:00 PM Pallav Carlean Purl, MD NEUR [facility]   01/16/2020  8:30 AM Monica Martinez, MD [facility]USPEC[facility]       History of Present Illness:  Jeremy Oliver is a 49 y.o. male with a history of combined systolic and diastolic heart failure who presents today for IV diuresis. Patient last seen by rheumatology earlier today and reported swelling, weight gain, and shortness of breath. His PCP and cardiologist were contacted and he was scheduled for IV Diuresis Clinic today. He reports worsening symptoms over the last 1-20mos. His weight is up on our scale, but he doesn't weigh at home. He endorses DOE walking short distances. He was able to walk from the car to the clinic lobby today, but then needed a wheelchair to get the rest of the way in to clinic. He endorses orthopnea, sleeping on the couch on 4-5 pillows. He is using BiPAP every night. He has LE edema up to  his thighs. He is taking Bumex 5mg  BID; he says he is urinating a lot on this dose. He unfortunately is still living in a building owned by his nephew. They do not have a fridge or a full kitchen; they have a microwave and what sounds like a hotplate or single burner. Consequently, they are unable to cook full meals or keep fresh food. He eats out a lot and has not been watching sodium, per his wife. He drinks several sodas a day and at least one big glass of ice water. He also chews on ice.     Past Medical History:   Diagnosis Date   ??? Angina at rest (CMS-[name])    ??? Arthritis    ??? Asthma    ??? Atrial fibrillation and flutter (CMS-[name]) 06/08/14   ??? BMI 40.0-44.9, adult (CMS-[name]) 10/29/2015   ??? Chronic anticoagulation 02/17/2018   ??? Chronic kidney disease, stage 3    ??? Chronic pain syndrome 04/11/2014    ~Use daily lyrica as recommended ~Tramadol as needed for pain ~Fluoxetine daily as recommended ~Daily exercise ~Eat a healthy diet ~Good control of your blood sugars can help    ??? Coronary artery disease    ??? Depression    ??? Diabetes mellitus (CMS-[name])    ??? Eye trauma 1998    glass in both eyes and removed   ??? GERD (gastroesophageal reflux disease)    ??? Gout    ??? Homeless    ??? Hyperlipidemia    ??? Hypertension    ??? Illiteracy and low-level literacy    ??? Microscopic hematuria    ??? Migraine    ??? Nephrolithiasis    ??? 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 Nonalcoholic fatty liver disease    ??? NSTEMI (non-ST elevated myocardial infarction) (CMS-[name]) 01/13/2017   ??? Obesity    ??? Obstructive sleep apnea 2009   ??? Panic attacks    ??? 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 Seizure-like activity (CMS-[name])     ~Monitor for symptoms and report them to your primary care provider if they occur    ??? Systolic CHF, chronic (CMS-[name])     EF 40-45% 2017   ??? TIA (transient ischemic attack)      Past Surgical History:   Procedure Laterality Date   ??? CARDIAC CATHETERIZATION  03/08/2007   ??? CORONARY STENT PLACEMENT     ??? CYSTOURETHROSCOPY  12/31/2009     Microscopic hematuria   ??? KNEE SURGERY Right 09/23/2006    arthroscopic knee surgery medial and lateral meniscus tears as well as crystal disease   ??? PR CATH PLACE/CORON ANGIO, IMG SUPER/INTERP,R&L HRT CATH, L HRT VENTRIC N/A 02/13/2017    Procedure: Left/Right Heart Catheterization;  Surgeon: Marlaine Hind, MD;  Location: [facility] CATH;  Service: Cardiology   ??? PR CATH PLACE/CORON ANGIO, IMG SUPER/INTERP,W LEFT HEART VENTRICULOGRAPHY N/A 03/17/2017    Procedure: Left Heart Catheterization W Intervention;  Surgeon: Marlaine Hind, MD;  Location: [facility] CATH;  Service: Cardiology   ??? PR CATH PLACE/CORON ANGIO, IMG SUPER/INTERP,W LEFT HEART VENTRICULOGRAPHY N/A  05/12/2018    Procedure: CATH LEFT HEART CATHETERIZATION W INTERVENTION;  Surgeon: Dorathy Kinsman, MD;  Location: [facility] CATH;  Service: Cardiology   ??? PR CATH PLACE/CORON ANGIO, IMG SUPER/INTERP,W LEFT HEART VENTRICULOGRAPHY N/A 10/22/2018    Procedure: CATH LEFT HEART CATHETERIZATION W INTERVENTION;  Surgeon: Marlaine Hind, MD;  Location: [facility] CATH;  Service: Cardiology   ??? PR ECMO/ECLS INITIATION VENO-ARTERIAL N/A 03/19/2017    Procedure: ECMO/ECLS; INITIATION, VENO-ARTERIAL;  Surgeon: Lennie Odor, MD;  Location: MAIN OR [facility];  Service: Cardiac Surgery   ??? PR ECMO/ECLS RMVL PRPH CANNULA OPEN 6 YRS & OLDER Left 03/25/2017    Procedure: ECMO / ECLS PROVIDED BY PHYSICIAN; REMOVAL OF PERIPHERAL (ARTERIAL AND/OR VENOUS) CANNULA(E), OPEN, 6 YEARS AND OLDER;  Surgeon: Alonna Buckler Ikonomidis, MD;  Location: MAIN OR [facility];  Service: Cardiac Surgery   ??? PR EPHYS EVAL W/ ABLATION SUPRAVENT ARRHYTHMIA N/A 07/19/2015    Catheter ablation of cavotricuspid isthmus for atrial flutter [facility])   ??? PR INSER HART PACER XVENOUS ATRIAL N/A 05/30/2015    [employer] dual-chamber pacemaker implant (E.Chung)   ??? PR INSERT/PLACE FLOW DIRECT CATH N/A 03/17/2017    Procedure: Insert Leave In [place];  Surgeon: Marlaine Hind, MD;  Location: [facility] CATH;  Service: Cardiology   ??? PR INSJ/RPLCMT PERM DFB W/TRNSVNS LDS 1/DUAL CHMBR N/A 05/04/2017    Procedure: ICD Implant System (Single/Dual);  Surgeon: Eldred Manges, MD;  Location: [facility] CATH;  Service: Cardiology   ??? PR NEGATIVE PRESSURE WOUND THERAPY DME >50 SQ CM Left 03/25/2017    Procedure: Neg Press Wound Tx (Vac Assist) Incl Topicals, Per Session, Tsa Greater Than/= 50 Cm Squared;  Surgeon: Alonna Buckler Ikonomidis, MD;  Location: MAIN OR [facility];  Service: Cardiac Surgery   ??? PR PRQ TRLUML CORONARY STENT W/ANGIO ONE ART/BRNCH N/A 03/12/2018    Procedure: Percutaneous Coronary Intervention;  Surgeon: Marlaine Hind, MD;  Location: [facility] CATH;  Service: Cardiology   ??? PR REBL VES DIRECT,LOW EXTREM Left 03/19/2017    Procedure: Repr Bld Vessel Direct; Lower Extrem;  Surgeon: Boykin Reaper, MD;  Location: MAIN OR [facility];  Service: Vascular   ??? PR REMV ART CLOT ILIAC-POP,LEG INCIS Left 03/25/2017    Procedure: EMBOLECTOMY OR THROMBECTOMY, WITH OR WITHOUT CATHETER; FEMOROPOPLITEAL, AORTOILIAC ARTERY, BY LEG INCISION;  Surgeon: Alonna Buckler Ikonomidis, MD;  Location: MAIN OR [facility];  Service: Cardiac Surgery   ??? PR UPPER GI ENDOSCOPY,BIOPSY N/A 02/28/2016    Procedure: UGI ENDOSCOPY; WITH BIOPSY, SINGLE OR MULTIPLE;  Surgeon: Liane Comber, MD;  Location: [facility];  Service: Gastroenterology   ??? PR UPPER GI ENDOSCOPY,DIAGNOSIS N/A 05/07/2016    Procedure: UGI ENDO, INCLUDE ESOPHAGUS, STOMACH, & DUODENUM &/OR JEJUNUM; DX W/WO COLLECTION SPECIMN, BY BRUSH OR WASH;  Surgeon: Modena Nunnery, MD;  Location: [facility];  Service: Gastroenterology       Current Outpatient Medications   Medication Sig Dispense Refill   ??? acetaminophen (TYLENOL) 500 MG tablet TAKE 2 TABLETS (1 000MG ) BY MOUTH EVERY 8 HOURS AS NEEDED FOR PAIN 180 each 3   ??? albuterol HFA 90 mcg/actuation inhaler Inhale 2 puffs into the lungs every 6 (six) hours as needed for wheezing or shortness of breath. 18 g 2   ??? alirocumab (PRALUENT) 150 mg/mL subcutaneous injection Inject 1 mL (150 mg total) under the skin every fourteen (14) days. 6 each 3   ??? allopurinoL (ZYLOPRIM) 100 MG tablet Take 2 tablets (200 mg total) by mouth  daily. 60 tablet 5   ??? amiodarone (PACERONE) 200 MG tablet TAKE 1 TABLET BY MOUTH ONCE DAILY 90 tablet 3   ??? amLODIPine (NORVASC) 10 MG tablet Take 1 tablet (10 mg total) by mouth daily. 90 tablet 3   ??? atorvastatin (LIPITOR) 80 MG tablet Take 1 tablet (80 mg total) by mouth daily. 90 tablet 3   ??? blood sugar diagnostic Strp Disp to match current ins approved meter. Use to test blood sugar four times daily. E11.22, Z79.4 400 each 3   ??? blood-glucose meter kit Disp. blood glucose meter kit preferred by patient's insurance. Dx: Diabetes, E11.9 1 each 11   ??? bumetanide (BUMEX) 2 MG tablet Take 1 tablet (2 mg total) by mouth two (2) times a day. (Patient taking differently: Take 5 mg by mouth two (2) times a day. ) 180 tablet 3   ??? carvediloL (COREG) 25 MG tablet Take 1.5 tablets (37.5 mg total) by mouth Two (2) times a day. 270 tablet 3   ??? cholecalciferol, vitamin D3, 5,000 unit capsule Take 1 capsule (5,000 Units total) by mouth daily. 100 capsule 0   ??? diclofenac sodium (VOLTAREN) 1 % gel Apply 2 grams topically Three (3) times a day. 180 g 11   ??? empty container Misc Use as directed to dispose of injectable medications 1 each 3   ??? ezetimibe (ZETIA) 10 mg tablet TAKE 1 TABLET BY MOUTH ONCE DAILY 90 each 3   ??? FLUoxetine (PROZAC) 40 MG capsule Take 2 capsules (80 mg total) by mouth daily. 180 capsule 3   ??? gabapentin (NEURONTIN) 300 MG capsule Take 1 capsule (300 mg total) by mouth two (2) times a day. 180 capsule 3   ??? insulin ASPART (NOVOLOG FLEXPEN U-100 INSULIN) 100 unit/mL (3 mL) injection pen Inject 20 units under the skin three (3) times a day with a meal. 15 mL 11   ??? insulin detemir U-100 (LEVEMIR) 100 unit/mL (3 mL) injection pen Inject 40 Units total under the skin nightly. 15 mL 3   ??? insulin syringe-needle U-100 0.3 mL 31 gauge x 5/16 Syrg Use to inject insulin twice daily with meals 60 each 0   ??? isosorbide mononitrate (IMDUR) 30 MG 24 hr tablet Take 4 tablets (120 mg total) by mouth daily. 120 tablet 3   ??? lancets Misc Disp. Ins preferred lancets. Use to test blood sugar four times daily. E11.22, Z79.4 400 each 3   ??? miscellaneous medical supply (BLOOD PRESSURE CUFF) Misc Measure BP once a day or if any symptoms 1 each 0   ??? nitroglycerin (NITROSTAT) 0.4 MG SL tablet Place 1 tablet (0.4 mg total) under the tongue every five (5) minutes as needed for chest pain. 100 each 3   ??? omeprazole (PRILOSEC) 40 MG capsule Take 1 capsule (40 mg total) by mouth Two (2) times a day (30 minutes before a meal). 180 capsule 3   ??? ondansetron (ZOFRAN) 4 MG tablet Take 1 tablet (4 mg total) by mouth every eight (8) hours as needed for nausea for up to 4 days. 10 tablet 0   ??? ondansetron (ZOFRAN) 4 MG tablet Take 1 tablet (4 mg total) by mouth every eight (8) hours as needed for nausea. 30 tablet 0   ??? rivaroxaban (XARELTO) 15 mg Tab Take 1 tablet (15 mg total) by mouth daily with evening meal. 90 tablet 3   ??? semaglutide 0.25 mg or 0.5 mg(2 mg/1.5 mL) PnIj Inject 0.5 mg under the skin every  seven (7) days. 4.5 mL 3   ??? sevelamer (RENVELA) 800 mg tablet TAKE 2 TABLETS (1600MG ) BY MOUTH THREE TIMES DAILY WITH MEALS 540 tablet 3   ??? sodium polystyrene, SPS, with sorbitol (SPS, WITH SORBITOL,) 15-20 gram/60 mL Susp Take 60 mL (15 g) by mouth once a week. 473 mL 3   ??? ticagrelor (BRILINTA) 90 mg Tab TAKE 1 TABLET BY MOUTH TWICE DAILY 180 tablet 3   ??? UNIFINE PENTIPS 31 gauge x 5/16 (8 mm) Ndle USE WITH INSULIN AND VICTOZA 5 TIMES DAILY 400 each 3   ??? loperamide (IMODIUM) 2 mg capsule Take 1 capsule (2 mg total) by mouth 4 (four) times a day as needed for diarrhea. 30 capsule 0     No current facility-administered medications for this visit.      Allergies   Allergen Reactions   ??? Losartan      Hyperkalemia (K>6)   ??? Opioids - Morphine Analogues Anxiety       Objective:       Physical Exam  BP 171/88  - Pulse 102  - Ht 188 cm (6' 2)  - Wt (!) 173.7 kg (383 lb)  - SpO2 99%  - BMI 49.17 kg/m??    Wt Readings from Last 12 Encounters:   06/27/19 (!) 173.7 kg (383 lb)   06/27/19 (!) 174.8 kg (385 lb 6.4 oz)   05/10/19 (!) 162.1 kg (357 lb 6.4 oz)   04/14/19 (!) 159 kg (350 lb 9.6 oz)   04/12/19 (!) 158.3 kg (348 lb 14.4 oz)   04/07/19 (!) 157 kg (346 lb 3.2 oz)   02/02/19 (!) 162.8 kg (358 lb 14.4 oz)   01/31/19 (!) 164.2 kg (362 lb)   01/26/19 (!) 160.6 kg (354 lb)   01/26/19 (!) 160.8 kg (354 lb 6.4 oz)   01/24/19 (!) 165.1 kg (364 lb)   01/20/19 (!) 164.2 kg (362 lb 1.6 oz)       General:  Patient is well-appearing in no acute distress   Eyes:  Intact, sclerae anicteric.   Ears, nose, mouth: Benign   Respiratory:   Normal respiratory effort. Clear to auscultation bilaterally.  There are no wheezes.   Cardiovascular:  JVP not seen above the clavicle with HOB at 90 degrees.  Rate and rhythm are irregularly irregular.  There is no lifts or heaves.  Normal S1, S2. There is no murmur, gallops or rubs.  Radial and pedal pulses are 2+, bilaterally.   There is 3+ pitting pedal/pretibial edema, bilaterally to lower legs, 1-2+ above knees   Gastrointestinal:   Soft, non-tender, with audible bowel sounds. Abdomen distended.  Liver is nonpalpable.   Musculoskeletal: No joint swelling   Skin: Warm, well perfused.   Neurologic: Appropriate mood and affect. Alert and oriented to person, place, and time. No gross motor or sensory deficits evident.     Recent Labs:  Office Visit on 06/27/2019   Component Date Value Ref Range Status   ??? Uric Acid 06/27/2019 9.4* 3.7 - 9.2 mg/dL Final   ??? Sodium 16/12/9602 136  135 - 145 mmol/L Final   ??? Potassium 06/27/2019 5.0  3.5 - 5.1 mmol/L Final   ??? Chloride 06/27/2019 107  98 - 107 mmol/L Final   ??? CO2 06/27/2019 22.7  20.0 - 31.0 mmol/L Final   ??? Anion Gap 06/27/2019 6  3 - 11 mmol/L Final   ??? BUN 06/27/2019 41* 9 - 23 mg/dL Final   ??? Creatinine 06/27/2019 5.06*  0.60 - 1.10 mg/dL Final   ??? BUN/Creatinine Ratio 06/27/2019 8   Final   ??? EGFR CKD-EPI Non-African American,* 06/27/2019 12  mL/min/1.91m2 Final   ??? EGFR CKD-EPI African American, Male 06/27/2019 14  mL/min/1.10m2 Final   ??? Glucose 06/27/2019 304* 70 - 99 mg/dL Final   ??? Calcium 16/12/9602 8.8  8.7 - 10.4 mg/dL Final       Lab Results   Component Value Date    PRO-BNP 4,620.0 (H) 04/14/2019    PRO-BNP 2,430.0 (H) 02/02/2019    PRO-BNP 3,670.0 (H) 01/26/2019    PRO-BNP 2,090.0 (H) 01/24/2019    PRO-BNP 2,550.0 (H) 01/20/2019    PRO-BNP 231 (H) 06/08/2014    Creatinine, POCT 4.4 (H) 02/02/2019    Creatinine, POCT 3.9 (H) 01/26/2019    Creatinine, POCT 3.6 (H) 01/24/2019    Creatinine 5.06 (H) 06/27/2019    Creatinine 4.70 (H) 04/14/2019    Creatinine 4.92 (H) 04/01/2019    Creatinine 3.73 (H) 01/20/2019    Creatinine 3.43 (H) 01/13/2019    Creatinine 1.30 06/09/2014    Creatinine 1.56 (H) 06/08/2014    Creatinine 1.32 (H) 10/17/2013    Creatinine 1.41 (H) 10/16/2013    Creatinine 1.36 (H) 07/25/2013    BUN 41 (H) 06/27/2019    BUN 38 (H) 04/14/2019    BUN 32 (H) 04/01/2019    BUN 33 (H) 01/20/2019    BUN 28 (H) 01/13/2019    BUN 24 (H) 06/09/2014    BUN 28 (H) 06/08/2014    BUN 16 10/17/2013    BUN 17 10/16/2013    BUN 16 07/25/2013    BUN, POCT 27 (H) 02/02/2019    BUN, POCT 27 (H) 01/26/2019    BUN, POCT 28 (H) 01/24/2019    Magnesium 1.9 02/02/2019    Magnesium 1.6 01/26/2019    Magnesium 1.6 01/24/2019    Magnesium 1.7 01/20/2019    Magnesium 1.7 01/13/2019    Magnesium 2.0 06/09/2014    Magnesium 1.7 05/12/2011    Magnesium 1.8 01/09/2011       Metric Tracker:  Did today's visit result in ED visit? No  Did today's visit result in hospital admission? No  Did today's visit result in referral to cardiology? n/a  Today's visit was a referral from Internal Medicine

## 2019-06-27 NOTE — Unmapped (Signed)
-----   Message from Franchot Heidelberg sent at 06/27/2019 12:06 PM EDT -----  Regarding: RE: reffrell to diane  Thank you all  ----- Message -----  From: Meredeth Ide  Sent: 06/27/2019  11:08 AM EDT  To: Franchot Heidelberg, Roma Schanz, MD  Subject: RE: reffrell to diane                            Good Morning.  If it has been more tan 6 months since last seen he will need a new referral.  Thank you  ----- Message -----  From: Roma Schanz, MD  Sent: 06/27/2019  10:36 AM EDT  To: Arman Filter  Subject: RE: reffrell to diane                            He has already seen her previously . Does he need a new referral?  I've CC'ed Denise who handles those referrals and scheduling for Counseling.     Angelique Blonder, can you answer the question above?    Thanks    CDG    ----- Message -----  From: Franchot Heidelberg  Sent: 06/27/2019   9:36 AM EDT  To: Roma Schanz, MD  Subject: reffrell to diane                                Dr Hulan Fess this patient is requesting a refferall to see diane Thanks for signing up for My [facility] Chart!    To access your account, go to [email or web address] and enter your My [facility] Chart Username and password.    If you have forgotten your Username and/or password, click the Forgot username? or Forgot password? links to recover your login information.    If you have any further difficulties accessing your account,call [facility] 24 hours a day, 7 days a week at [phone].     Remember,  My [facility] Chart is NOT to be used for urgent needs. For medical emergencies, call 911 or go to the nearest urgent care or emergency room.       My [facility] Chart Username: _____________________     My  Chart Password: _____________________     you

## 2019-06-27 NOTE — Unmapped (Signed)
IV Diuresis Clinic Discharge Instructions:    MEDICATIONS:  NO medication changes today.    Call if you have questions about your medications.    IMPORTANT INSTRUCTIONS:  - Weigh yourself daily in the morning; write your weight down and bring it with you next time   - Limit your fluid intake to 2 Liters (half-gallon) per day    - Limit your salt intake to 2-3 grams (2000-3000 mg) per day  - If you have a morning diuresis appointment, don't take your morning diuretic  - If you have an afternoon diuresis appointment, take your morning diuretic    LABS:  Cr was elevated from usual    NEXT APPOINTMENT:  Return to clinic in 2 days with IV Diuresis - Wednesday at 11am, after appt with Dr Hulan Fess    The Cardiology Clinic has moved. Our new address is [address], [place], [place] [address], 2nd floor.     My office number (c/o De Burrs RN) is [phone] if you need further assistance, or need to schedule with our IV Diuresis clinic in the future.    If you need to reschedule future appointments, please call [phone] or [phone]. After office hours, if you have urgent questions/problems, contact the on-call cardiologist through the hospital operator: [phone].

## 2019-06-27 NOTE — Unmapped (Addendum)
We will check labs for gout today and will repeat them with your kidney lab draws in the future to help titrate the allopurinol.     Please increase allopurinol to 200mg  daily.     Please call with any gout flares.     We are also checking your kidney function and getting in touch with your cardiologist and nephrologist to help with management of your fluid pills. If you feel more short of breath, please go to the ER for evaluation.

## 2019-06-28 LAB — PRO-BNP: Serology studies:Cmplx:-:^Patient:Set:: 4671 — ABNORMAL HIGH

## 2019-06-28 NOTE — Unmapped (Signed)
[facility] Note    Primary Cardiologist/Cardiology Provider(s):  Dr Andrey Farmer  PCP:  Kurtis Bushman, MD    Last Cardiology Clinic Visit:  04/14/19  Last IV Diuresis Visit: 06/27/19    Reason for Visit:  Jeremy Oliver is a 49 y.o. male with a history of combined systolic and diastolic heart failure, who is being seen today in the [facility] for an urgent visit for volume status optimization including IV diuresis.    Assessment/Plan:  1. Acute on Chronic combined systolic and diastolic heart failure, acute on chronic kidney disease  - Volume status today is improved. His weight is down ~3 lbs in 2 days   - Weight: 385 --> 382  - NYHA Class III; he has improvement of DOE, LE edema, orthopnea  - IV Lasix 160 mg x2 was administered during clinic with good response  - Labs were obtained: Cr 5.1, K 5.2  - Continue Bumex 5mg  BID.  - Again recommend reducing sodium, fluid intake. Definitely exceeding limits on both.     2. HTN  - poorly controlled, 155/85 today  - On amlodipine 10mg  daily; not on ACE/ARB given renal function  - Not taking hydralazine, says it drops his BP too low (previously on 25mg  TID)  - He agrees to try hydralazine 5mg  TID    Response to IV diuresis:          Medications Heart rate Blood pressure Weight (lbs)   Pre  77 155/85 382.9   Hour 0 IV Lasix 160 mg      Hour 2 IV Lasix 160 mg 74 145/75    Post    380.9     Output:  I/O       03/29 0701 - 03/30 0700 03/30 0701 - 03/31 0700 03/31 0701 - 04/01 0700    Urine (mL/kg/hr)   800    Total Output(mL/kg)   800 (4.6)    Net   -800               Return to clinic:    Return if symptoms worsen or fail to improve.    Future Appointments   Date Time Provider Department Center   07/04/2019 10:30 AM Mila Merry, MD [facility]   07/14/2019  3:00 PM Marlaine Hind, MD [facility]   07/26/2019  8:15 AM [facility] EP REMOTE MONITORING [facility]   08/02/2019 12:00 PM Pallav Carlean Purl, MD NEUR [facility] 08/03/2019  8:20 AM Roma Schanz, MD [facility]   01/16/2020  8:30 AM Monica Martinez, MD [facility]       History of Present Illness:  Jeremy Oliver is a 49 y.o. male with a history of combined systolic and diastolic heart failure who presents today for IV diuresis. Patient last seen by rheumatology 3/29 and reported swelling, weight gain, and shortness of breath. His PCP and cardiologist were contacted and he was scheduled for IV Diuresis Clinic. He reported worsening symptoms over the last 1-43mos. His weight was up on our scale, but he was not weighing at home. He endorsed DOE walking short distances. He was able to walk from the car to the clinic lobby, but then needed a wheelchair to get the rest of the way in to clinic. He endorsed orthopnea, sleeping on the couch on 4-5 pillows. He was using BiPAP every night. He had LE edema up to his thighs. He was  taking Bumex 5mg  BID and said he was urinating a lot on this dose. Before retaining all this fluid, pt stated he could walk around [employer] without issue. He unfortunately was still living in a building owned by his nephew without a fridge or a full kitchen; they have a microwave and what sounds like a hotplate or single burner. Consequently, pt unable to cook full meals or keep fresh food. He was eating out a lot and not watching sodium, per his wife. He was drinking several sodas a day and at least one big glass of ice water, also chewing on ice. He was diuresed with IV Lasix 160mg  x2 with good response.     Today, patient presents for IV diuresis. Patient reports feeling better since his visit on 06/27/2019. Patient was able to walk into clinic without the use of a wheelchair and denies shortness of breath. Patient reports sleeping on 4 pillows on the couch that is now at his baseline. Decrease in swelling in bilateral lower extremities. Patient provided log of output over the last two days with a total of 6,100 output. Past Medical History:   Diagnosis Date   ??? Angina at rest (CMS-[name])    ??? Arthritis    ??? Asthma    ??? Atrial fibrillation and flutter (CMS-[name]) 06/08/14   ??? BMI 40.0-44.9, adult (CMS-[name]) 10/29/2015   ??? Chronic anticoagulation 02/17/2018   ??? Chronic kidney disease, stage 3    ??? Chronic pain syndrome 04/11/2014    ~Use daily lyrica as recommended ~Tramadol as needed for pain ~Fluoxetine daily as recommended ~Daily exercise ~Eat a healthy diet ~Good control of your blood sugars can help    ??? Coronary artery disease    ??? Depression    ??? Diabetes mellitus (CMS-[name])    ??? Eye trauma 1998    glass in both eyes and removed   ??? GERD (gastroesophageal reflux disease)    ??? Gout    ??? Homeless    ??? Hyperlipidemia    ??? Hypertension    ??? Illiteracy and low-level literacy    ??? Microscopic hematuria    ??? Migraine    ??? Nephrolithiasis    ??? 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 Nonalcoholic fatty liver disease    ??? NSTEMI (non-ST elevated myocardial infarction) (CMS-[name]) 01/13/2017   ??? Obesity    ??? Obstructive sleep apnea 2009   ??? Panic attacks    ??? 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 Seizure-like activity (CMS-[name])     ~Monitor for symptoms and report them to your primary care provider if they occur    ??? Systolic CHF, chronic (CMS-[name])     EF 40-45% 2017   ??? TIA (transient ischemic attack)      Past Surgical History:   Procedure Laterality Date   ??? CARDIAC CATHETERIZATION  03/08/2007   ??? CORONARY STENT PLACEMENT     ??? CYSTOURETHROSCOPY  12/31/2009     Microscopic hematuria   ??? KNEE SURGERY Right 09/23/2006 arthroscopic knee surgery medial and lateral meniscus tears as well as crystal disease   ???  PR CATH PLACE/CORON ANGIO, IMG SUPER/INTERP,R&L HRT CATH, L HRT VENTRIC N/A 02/13/2017    Procedure: Left/Right Heart Catheterization;  Surgeon: Marlaine Hind, MD;  Location: [facility] CATH;  Service: Cardiology   ??? PR CATH PLACE/CORON ANGIO, IMG SUPER/INTERP,W LEFT HEART VENTRICULOGRAPHY N/A 03/17/2017    Procedure: Left Heart Catheterization W Intervention;  Surgeon: Marlaine Hind, MD;  Location: [facility] CATH;  Service: Cardiology   ??? PR CATH PLACE/CORON ANGIO, IMG SUPER/INTERP,W LEFT HEART VENTRICULOGRAPHY N/A 05/12/2018    Procedure: CATH LEFT HEART CATHETERIZATION W INTERVENTION;  Surgeon: Dorathy Kinsman, MD;  Location: [facility] CATH;  Service: Cardiology   ??? PR CATH PLACE/CORON ANGIO, IMG SUPER/INTERP,W LEFT HEART VENTRICULOGRAPHY N/A 10/22/2018    Procedure: CATH LEFT HEART CATHETERIZATION W INTERVENTION;  Surgeon: Marlaine Hind, MD;  Location: [facility] CATH;  Service: Cardiology   ??? PR ECMO/ECLS INITIATION VENO-ARTERIAL N/A 03/19/2017    Procedure: ECMO/ECLS; INITIATION, VENO-ARTERIAL;  Surgeon: Lennie Odor, MD;  Location: MAIN OR [facility];  Service: Cardiac Surgery   ??? PR ECMO/ECLS RMVL PRPH CANNULA OPEN 6 YRS & OLDER Left 03/25/2017    Procedure: ECMO / ECLS PROVIDED BY PHYSICIAN; REMOVAL OF PERIPHERAL (ARTERIAL AND/OR VENOUS) CANNULA(E), OPEN, 6 YEARS AND OLDER;  Surgeon: Alonna Buckler Ikonomidis, MD;  Location: MAIN OR [facility];  Service: Cardiac Surgery   ??? PR EPHYS EVAL W/ ABLATION SUPRAVENT ARRHYTHMIA N/A 07/19/2015    Catheter ablation of cavotricuspid isthmus for atrial flutter [facility])   ??? PR INSER HART PACER XVENOUS ATRIAL N/A 05/30/2015    [employer] dual-chamber pacemaker implant (E.Chung)   ??? PR INSERT/PLACE FLOW DIRECT CATH N/A 03/17/2017    Procedure: Insert Leave In [place];  Surgeon: Marlaine Hind, MD;  Location: [facility] CATH;  Service: Cardiology   ??? PR INSJ/RPLCMT PERM DFB W/TRNSVNS LDS 1/DUAL CHMBR N/A 05/04/2017    Procedure: ICD Implant System (Single/Dual);  Surgeon: Eldred Manges, MD;  Location: [facility] CATH;  Service: Cardiology   ??? PR NEGATIVE PRESSURE WOUND THERAPY DME >50 SQ CM Left 03/25/2017    Procedure: Neg Press Wound Tx (Vac Assist) Incl Topicals, Per Session, Tsa Greater Than/= 50 Cm Squared;  Surgeon: Alonna Buckler Ikonomidis, MD;  Location: MAIN OR [facility];  Service: Cardiac Surgery   ??? PR PRQ TRLUML CORONARY STENT W/ANGIO ONE ART/BRNCH N/A 03/12/2018    Procedure: Percutaneous Coronary Intervention;  Surgeon: Marlaine Hind, MD;  Location: [facility] CATH;  Service: Cardiology   ??? PR REBL VES DIRECT,LOW EXTREM Left 03/19/2017    Procedure: Repr Bld Vessel Direct; Lower Extrem;  Surgeon: Boykin Reaper, MD;  Location: MAIN OR [facility];  Service: Vascular   ??? PR REMV ART CLOT ILIAC-POP,LEG INCIS Left 03/25/2017    Procedure: EMBOLECTOMY OR THROMBECTOMY, WITH OR WITHOUT CATHETER; FEMOROPOPLITEAL, AORTOILIAC ARTERY, BY LEG INCISION;  Surgeon: Alonna Buckler Ikonomidis, MD;  Location: MAIN OR [facility];  Service: Cardiac Surgery   ??? PR UPPER GI ENDOSCOPY,BIOPSY N/A 02/28/2016    Procedure: UGI ENDOSCOPY; WITH BIOPSY, SINGLE OR MULTIPLE;  Surgeon: Liane Comber, MD;  Location: [facility];  Service: Gastroenterology   ??? PR UPPER GI ENDOSCOPY,DIAGNOSIS N/A 05/07/2016    Procedure: UGI ENDO, INCLUDE ESOPHAGUS, STOMACH, & DUODENUM &/OR JEJUNUM; DX W/WO COLLECTION SPECIMN, BY BRUSH OR WASH;  Surgeon: Modena Nunnery, MD;  Location: [facility];  Service: Gastroenterology       Current Outpatient Medications   Medication Sig Dispense Refill   ??? acetaminophen (TYLENOL) 500 MG tablet TAKE 2 TABLETS (1 000MG )  BY MOUTH EVERY 8 HOURS AS NEEDED FOR PAIN 180 each 3   ??? albuterol HFA 90 mcg/actuation inhaler Inhale 2 puffs into the lungs every 6 (six) hours as needed for wheezing or shortness of breath. 18 g 2   ??? alirocumab (PRALUENT) 150 mg/mL subcutaneous injection Inject the contents of 1 pen (150 mg total) under the skin every fourteen (14) days. 6 mL 3   ??? allopurinoL (ZYLOPRIM) 100 MG tablet Take 2 tablets (200 mg total) by mouth daily. 60 tablet 5   ??? amiodarone (PACERONE) 200 MG tablet TAKE 1 TABLET BY MOUTH ONCE DAILY 90 tablet 3   ??? amLODIPine (NORVASC) 10 MG tablet Take 1 tablet (10 mg total) by mouth daily. 90 tablet 3   ??? atorvastatin (LIPITOR) 80 MG tablet Take 1 tablet (80 mg total) by mouth daily. 90 tablet 3   ??? blood sugar diagnostic Strp Disp to match current ins approved meter. Use to test blood sugar four times daily. E11.22, Z79.4 400 each 3   ??? blood-glucose meter kit Disp. blood glucose meter kit preferred by patient's insurance. Dx: Diabetes, E11.9 1 each 11   ??? bumetanide (BUMEX) 2 MG tablet Take 2.5 tablets (5 mg total) by mouth two (2) times a day. 450 tablet 3   ??? carvediloL (COREG) 25 MG tablet Take 1.5 tablets (37.5 mg total) by mouth Two (2) times a day. 270 tablet 3   ??? cholecalciferol, vitamin D3, 5,000 unit capsule Take 1 capsule (5,000 Units total) by mouth daily. 100 capsule 0   ??? diclofenac sodium (VOLTAREN) 1 % gel Apply 2 grams topically Three (3) times a day. 180 g 11   ??? empty container Misc Use as directed to dispose of injectable medications 1 each 3   ??? ezetimibe (ZETIA) 10 mg tablet TAKE 1 TABLET BY MOUTH ONCE DAILY 90 each 3   ??? FLUoxetine (PROZAC) 40 MG capsule Take 2 capsules (80 mg total) by mouth daily. 180 capsule 3   ??? gabapentin (NEURONTIN) 300 MG capsule Take 1 capsule (300 mg total) by mouth two (2) times a day. 180 capsule 3   ??? hydrALAZINE (APRESOLINE) 10 MG tablet Take 0.5 tablet (5 mg total) by mouth three (3) times a day. 45 tablet 11   ??? insulin ASPART (NOVOLOG FLEXPEN U-100 INSULIN) 100 unit/mL (3 mL) injection pen Inject 20 units under the skin three (3) times a day with a meal. 15 mL 11   ??? insulin detemir U-100 (LEVEMIR) 100 unit/mL (3 mL) injection pen Inject 45 Units total under the skin nightly. 15 mL 3   ??? insulin syringe-needle U-100 0.3 mL 31 gauge x 5/16 Syrg Use to inject insulin twice daily with meals 60 each 0   ??? isosorbide mononitrate (IMDUR) 30 MG 24 hr tablet Take 4 tablets (120 mg total) by mouth daily. 120 tablet 3   ??? lancets Misc Disp. Ins preferred lancets. Use to test blood sugar four times daily. E11.22, Z79.4 400 each 3   ??? loperamide (IMODIUM) 2 mg capsule Take 1 capsule (2 mg total) by mouth 4 (four) times a day as needed for diarrhea. 30 capsule 0   ??? miscellaneous medical supply (BLOOD PRESSURE CUFF) Misc Measure BP once a day or if any symptoms 1 each 0   ??? nitroglycerin (NITROSTAT) 0.4 MG SL tablet Place 1 tablet (0.4 mg total) under the tongue every five (5) minutes as needed for chest pain. 100 each 3   ??? omeprazole (PRILOSEC) 40 MG  capsule Take 1 capsule (40 mg total) by mouth Two (2) times a day (30 minutes before a meal). 180 capsule 3   ??? ondansetron (ZOFRAN) 4 MG tablet Take 1 tablet (4 mg total) by mouth every eight (8) hours as needed for nausea for up to 4 days. 10 tablet 0   ??? ondansetron (ZOFRAN) 4 MG tablet Take 1 tablet (4 mg total) by mouth every eight (8) hours as needed for nausea. 30 tablet 0   ??? rivaroxaban (XARELTO) 15 mg Tab Take 1 tablet (15 mg total) by mouth daily with evening meal. 90 tablet 3   ??? semaglutide 0.25 mg or 0.5 mg(2 mg/1.5 mL) PnIj Inject 0.5 mg under the skin every seven (7) days. 4.5 mL 3   ??? sevelamer (RENVELA) 800 mg tablet TAKE 2 TABLETS (1600MG ) BY MOUTH THREE TIMES DAILY WITH MEALS 540 tablet 3   ??? sodium polystyrene, SPS, with sorbitol (SPS, WITH SORBITOL,) 15-20 gram/60 mL Susp Take 60 mL (15 g) by mouth once a week. 473 mL 3   ??? ticagrelor (BRILINTA) 90 mg Tab TAKE 1 TABLET BY MOUTH TWICE DAILY 180 tablet 3   ??? UNIFINE PENTIPS 31 gauge x 5/16 (8 mm) Ndle USE WITH INSULIN AND VICTOZA 5 TIMES DAILY 400 each 3     No current facility-administered medications for this visit.      Allergies   Allergen Reactions   ??? Losartan      Hyperkalemia (K>6) ??? Opioids - Morphine Analogues Anxiety       Objective:       Physical Exam  BP 145/75 (BP Site: L Arm, BP Position: Sitting, BP Cuff Size: Large)  - Pulse 74  - Temp 36.4 ??C (97.6 ??F) (Temporal)  - Ht 188 cm (6' 2)  - Wt (!) 172.8 kg (380 lb 14.4 oz)  - SpO2 97%  - BMI 48.90 kg/m??    Wt Readings from Last 12 Encounters:   06/29/19 (!) 172.8 kg (380 lb 14.4 oz)   06/29/19 (!) 174.5 kg (384 lb 9.6 oz)   06/27/19 (!) 173.7 kg (383 lb)   06/27/19 (!) 174.8 kg (385 lb 6.4 oz)   05/10/19 (!) 162.1 kg (357 lb 6.4 oz)   04/14/19 (!) 159 kg (350 lb 9.6 oz)   04/12/19 (!) 158.3 kg (348 lb 14.4 oz)   04/07/19 (!) 157 kg (346 lb 3.2 oz)   02/02/19 (!) 162.8 kg (358 lb 14.4 oz)   01/31/19 (!) 164.2 kg (362 lb)   01/26/19 (!) 160.6 kg (354 lb)   01/26/19 (!) 160.8 kg (354 lb 6.4 oz)       General:  Patient is well-appearing in no acute distress   Eyes:  Intact, sclerae anicteric.   Ears, nose, mouth: Benign   Respiratory:   Normal respiratory effort. Clear to auscultation bilaterally.  There are no wheezes.   Cardiovascular:  JVP not seen above the clavicle with HOB at 90 degrees.  Rate and rhythm are regular.  There is no lifts or heaves.  Normal S1, S2. There is no murmur, gallops or rubs.  Radial and pedal pulses are 2+, bilaterally.   There is 3+ edema, bilaterally to lower legs, 1-2+ above knees   Gastrointestinal:   Soft, non-tender, with audible bowel sounds. Abdomen distended.  Liver is nonpalpable.   Musculoskeletal: No joint swelling   Skin: Warm, well perfused.   Neurologic: Appropriate mood and affect. Alert and oriented to person, place, and time. No gross motor  or sensory deficits evident.     Recent Labs:  Office Visit on 06/29/2019   Component Date Value Ref Range Status   ??? Magnesium 06/29/2019 1.9  1.6 - 2.6 mg/dL Final   ??? Sodium, POCT 06/29/2019 136  135 - 145 mmol/L Final   ??? Potassium, POCT 06/29/2019 5.2* 3.5 - 5.0 mmol/L Final   ??? Chloride, POCT 06/29/2019 105  98 - 107 mmol/L Final   ??? CO2, POCT 06/29/2019 25  22 - 30 mmol/L Final   ??? BUN, POCT 06/29/2019 42* 7 - 21 mg/dL Final   ??? Creatinine, POCT 06/29/2019 5.1* 0.7 - 1.3 mg/dL Final   ??? Glucose, POCT 06/29/2019 316* 70 - 179 mg/dL Final   ??? Calcium, POCT 06/29/2019 8.0* 8.5 - 10.2 mg/dL Final   Office Visit on 06/27/2019   Component Date Value Ref Range Status   ??? Magnesium 06/27/2019 1.8  1.6 - 2.6 mg/dL Final   ??? PRO-BNP 16/12/9602 4,671.0* 0.0 - 125.0 pg/mL Final   Office Visit on 06/27/2019   Component Date Value Ref Range Status   ??? Uric Acid 06/27/2019 9.4* 3.7 - 9.2 mg/dL Final   ??? Sodium 54/11/8117 136  135 - 145 mmol/L Final   ??? Potassium 06/27/2019 5.0  3.5 - 5.1 mmol/L Final   ??? Chloride 06/27/2019 107  98 - 107 mmol/L Final   ??? CO2 06/27/2019 22.7  20.0 - 31.0 mmol/L Final   ??? Anion Gap 06/27/2019 6  3 - 11 mmol/L Final   ??? BUN 06/27/2019 41* 9 - 23 mg/dL Final   ??? Creatinine 06/27/2019 5.06* 0.60 - 1.10 mg/dL Final   ??? BUN/Creatinine Ratio 06/27/2019 8   Final   ??? EGFR CKD-EPI Non-African American,* 06/27/2019 12  mL/min/1.8m2 Final   ??? EGFR CKD-EPI African American, Male 06/27/2019 14  mL/min/1.63m2 Final   ??? Glucose 06/27/2019 304* 70 - 99 mg/dL Final   ??? Calcium 14/78/2956 8.8  8.7 - 10.4 mg/dL Final   ??? TSH 21/30/8657 7.992* 0.550 - 4.780 uIU/mL Final   ??? Free T4 06/27/2019 1.28  0.89 - 1.76 ng/dL Final       Lab Results   Component Value Date    PRO-BNP 4,671.0 (H) 06/27/2019    PRO-BNP 4,620.0 (H) 04/14/2019    PRO-BNP 2,430.0 (H) 02/02/2019    PRO-BNP 3,670.0 (H) 01/26/2019    PRO-BNP 2,090.0 (H) 01/24/2019    PRO-BNP 231 (H) 06/08/2014    Creatinine, POCT 5.1 (H) 06/29/2019    Creatinine, POCT 4.4 (H) 02/02/2019    Creatinine, POCT 3.9 (H) 01/26/2019    Creatinine, POCT 3.6 (H) 01/24/2019    Creatinine 5.06 (H) 06/27/2019    Creatinine 4.70 (H) 04/14/2019    Creatinine 4.92 (H) 04/01/2019    Creatinine 3.73 (H) 01/20/2019    Creatinine 3.43 (H) 01/13/2019    Creatinine 1.30 06/09/2014    Creatinine 1.56 (H) 06/08/2014    Creatinine 1.32 (H) 10/17/2013    Creatinine 1.41 (H) 10/16/2013    Creatinine 1.36 (H) 07/25/2013    BUN 41 (H) 06/27/2019    BUN 38 (H) 04/14/2019    BUN 32 (H) 04/01/2019    BUN 33 (H) 01/20/2019    BUN 28 (H) 01/13/2019    BUN 24 (H) 06/09/2014    BUN 28 (H) 06/08/2014    BUN 16 10/17/2013    BUN 17 10/16/2013    BUN 16 07/25/2013    BUN, POCT 42 (H) 06/29/2019    BUN, POCT 27 (H)  02/02/2019    BUN, POCT 27 (H) 01/26/2019    BUN, POCT 28 (H) 01/24/2019    Magnesium 1.9 06/29/2019    Magnesium 1.8 06/27/2019    Magnesium 1.9 02/02/2019    Magnesium 1.6 01/26/2019    Magnesium 1.6 01/24/2019    Magnesium 2.0 06/09/2014    Magnesium 1.7 05/12/2011    Magnesium 1.8 01/09/2011       Metric Tracker:  Did today's visit result in ED visit? No  Did today's visit result in hospital admission? No  Did today's visit result in referral to cardiology? n/a  Today's visit was a referral from Internal Medicine

## 2019-06-29 ENCOUNTER — Ambulatory Visit: Admit: 2019-06-29 | Discharge: 2019-06-29 | Payer: MEDICARE

## 2019-06-29 ENCOUNTER — Ambulatory Visit: Admit: 2019-06-29 | Discharge: 2019-06-29 | Payer: MEDICARE | Attending: Adult Health | Primary: Adult Health

## 2019-06-29 DIAGNOSIS — Z1211 Encounter for screening for malignant neoplasm of colon: Principal | ICD-10-CM

## 2019-06-29 DIAGNOSIS — R6889 Other general symptoms and signs: Principal | ICD-10-CM

## 2019-06-29 DIAGNOSIS — I5043 Acute on chronic combined systolic (congestive) and diastolic (congestive) heart failure: Principal | ICD-10-CM

## 2019-06-29 DIAGNOSIS — E1122 Type 2 diabetes mellitus with diabetic chronic kidney disease: Principal | ICD-10-CM

## 2019-06-29 DIAGNOSIS — N184 Chronic kidney disease, stage 4 (severe): Principal | ICD-10-CM

## 2019-06-29 DIAGNOSIS — I5042 Chronic combined systolic (congestive) and diastolic (congestive) heart failure: Principal | ICD-10-CM

## 2019-06-29 DIAGNOSIS — Z794 Long term (current) use of insulin: Principal | ICD-10-CM

## 2019-06-29 LAB — MAGNESIUM: Magnesium:MCnc:Pt:Ser/Plas:Qn:: 1.9

## 2019-06-29 LAB — FREE T4: Chemistry studies:Cmplx:-:^Patient:Set:: 1.28

## 2019-06-29 LAB — THYROID STIMULATING HORMONE: Chemistry studies:Cmplx:-:^Patient:Set:: 7.992 — ABNORMAL HIGH

## 2019-06-29 LAB — PRO-BNP: Natriuretic peptide.B prohormone N-Terminal:MCnc:Pt:Ser/Plas:Qn:: 3960 — ABNORMAL HIGH

## 2019-06-29 MED ORDER — INSULIN DETEMIR (U-100) 100 UNIT/ML (3 ML) SUBCUTANEOUS PEN
Freq: Every evening | SUBCUTANEOUS | 3 refills | 33 days | Status: CP
Start: 2019-06-29 — End: ?
  Filled 2019-06-29: qty 15, 33d supply, fill #0

## 2019-06-29 MED ORDER — HYDRALAZINE 10 MG TABLET
ORAL_TABLET | Freq: Three times a day (TID) | ORAL | 11 refills | 30.00000 days | Status: CP
Start: 2019-06-29 — End: 2020-06-28
  Filled 2019-06-29: qty 45, 30d supply, fill #0

## 2019-06-29 MED FILL — UNIFINE PENTIPS 31 GAUGE X 5/16" NEEDLE (8 MM): 80 days supply | Qty: 400 | Fill #1

## 2019-06-29 MED FILL — HYDRALAZINE 10 MG TABLET: 30 days supply | Qty: 45 | Fill #0 | Status: AC

## 2019-06-29 MED FILL — LEVEMIR FLEXTOUCH U-100 INSULIN 100 UNIT/ML (3 ML) SUBCUTANEOUS PEN: 33 days supply | Qty: 15 | Fill #0 | Status: AC

## 2019-06-29 MED FILL — UNIFINE PENTIPS 31 GAUGE X 5/16" NEEDLE (8 MM): 80 days supply | Qty: 400 | Fill #1 | Status: AC

## 2019-06-29 NOTE — Unmapped (Signed)
[facility] BPs  BP#1 160/81   BP#2 160/75  BP#3 157/78    Average BP 159/78  (please note this as a comment in vitals)

## 2019-06-29 NOTE — Unmapped (Signed)
[facility]  Established Visit    Reason for Visit:  HFrEF, diabetes    1. Acute on chronic combined systolic and diastolic heart failure (CMS-[name])    2. Type 2 diabetes mellitus with stage 4 chronic kidney disease, with long-term current use of insulin (CMS-[name])    3. Cold intolerance    4. Screen for colon cancer        Assessment/Plans:  1. Acute on chronic combined systolic and diastolic heart failure (CMS-[name])  Had visit to diuresis clinic this week. Had good response to Lasix 160mg  IV x 2.  Since then has had good output at home.    - diuresis clinic appt immediately after this visit  - appears that Praluent refill was sent 3/29 by cardiology to [facility]    2. Type 2 diabetes mellitus with stage 4 chronic kidney disease, with long-term current use of insulin (CMS-[name])  Lab Results   Component Value Date    A1C 7.2 (H) 04/14/2019   has been well controlled.  Recent hyperglycemia >200 in past week possibly related to acute HF exacerbation  Given that he remains > 30 lbs over dry weight, concern about absorption.   Will go ahead and increase to Levemir 45 units nightly, continue Novolog 20 units TID, semaglutide weekly.     3. Cold intolerance  TFTs might be difficult to interpret in setting of acute illness.  Will likely need to trend over time.  Advised that   - TSH; Future  - T4, Free; Future    4. Screen for colon cancer  - Immunochemical Fecal Occult Blood Test (FIT), automated    Colon cancer screening shared decision making    Patient is average risk for colon cancer (denies family history, genetic CRC syndromes, childhood abdominal radiation, IBD)    Data has shown that the best colorectal cancer screening test is the one preferred by the patient (Inadomi et al., 2012). Therefore, a shared decision-making approach towards colorectal cancer screening should be adopted for average risk patients. The risks and benefits of multiple screening modalities were discussed with the patient, and all questions were answered. After our discussion, the patient opted for FIT as their screening test of choice for colorectal cancer based on their preference for less invasive test.        Return in about 4 weeks (around 07/27/2019). face to face    Medication adherence and barriers to the treatment plan have been addressed. Opportunities to optimize healthy behaviors have been discussed. Patient / caregiver voiced understanding.        __________________________________________________________    HPI:    UOP over past 2 days  Mon 3400 cc  Tues 2700 cc     Feels much better than 2 days ago.  Less orthopnea and DOE compared to 2 days ago.  Legs still feel swollen. Difficult to get shoes on.     Not wearing brace on left leg for foot drop, had recent fall. Prior brace caused issues.     Not able to get brace because < 5 years    Needs refills of praluent, but not sure if paperwork needs to be done. Gets filled [facility]        Medications:  Reviewed in EPIC      Physical Exam:   Vital Signs:   BP 159/78 Comment: average - Pulse 77 Comment: average - Temp 37 ??C (98.6 ??F) (Temporal)  - Ht 188 cm (6' 2)  -  Wt (!) 174.5 kg (384 lb 9.6 oz)  - SpO2 98%  - BMI 49.38 kg/m??   Gen: chronically ill appearing, NAD  CV: RRR, no murmurs  Pulm: CTA bilaterally, no crackles or wheezes  GI: Soft, mildly distended  MSK: 2+ LE edema

## 2019-06-29 NOTE — Unmapped (Addendum)
IV Diuresis Clinic Discharge Instructions:    MEDICATIONS:  We are changing your medications today.  - Please try hydralazine 5mg  (HALF a pill) three times a day. If you blood pressure drops low, you can stop it. Please write down how low it gets.   Call if you have questions about your medications.    IMPORTANT INSTRUCTIONS:  - Weigh yourself daily in the morning; write your weight down and bring it with you next time   - Limit your fluid intake to 2 Liters (half-gallon) per day    - Limit your salt intake to 2-3 grams (2000-3000 mg) per day  - If you have a morning diuresis appointment, don't take your morning diuretic  - If you have an afternoon diuresis appointment, take your morning diuretic    LABS:  Creatinine was stable    NEXT APPOINTMENT:  Return to clinic in 5 days with Dr Cherly Hensen   Please call us if you need to come back to the diuresis clinic.     [facility] has moved. Our new address is [address], [place], [place] [address], [address]nd floor.     My office number (c/o De Burrs RN) is [phone] if you need further assistance, or need to schedule with our IV Diuresis clinic in the future.    If you need to reschedule future appointments, please call [phone] or [phone]. After office hours, if you have urgent questions/problems, contact the on-call cardiologist through the hospital operator: [phone].

## 2019-06-29 NOTE — Unmapped (Addendum)
Medication Changes:  - increase the Levemir to 45 units at night.   - keep taking Novolog 20 units 3 times a day    Things to do:  - if the blood sugars are getting back to less than 130, then you can go back to Levemir 40 units at night    Keep tracking how much you are urinating.    Please take all medications as prescribed.   Please take all of your diuretics and heart failure medications.

## 2019-06-30 NOTE — Unmapped (Signed)
I was the supervising physician in the delivery of the service. Trinda Harlacher D Jarica Plass, MD

## 2019-07-04 ENCOUNTER — Ambulatory Visit: Admit: 2019-07-04 | Discharge: 2019-07-04 | Payer: MEDICARE

## 2019-07-04 DIAGNOSIS — I2510 Atherosclerotic heart disease of native coronary artery without angina pectoris: Principal | ICD-10-CM

## 2019-07-04 DIAGNOSIS — N185 Chronic kidney disease, stage 5: Principal | ICD-10-CM

## 2019-07-04 DIAGNOSIS — I10 Essential (primary) hypertension: Principal | ICD-10-CM

## 2019-07-04 DIAGNOSIS — Z794 Long term (current) use of insulin: Principal | ICD-10-CM

## 2019-07-04 DIAGNOSIS — E1122 Type 2 diabetes mellitus with diabetic chronic kidney disease: Secondary | ICD-10-CM

## 2019-07-04 DIAGNOSIS — N184 Chronic kidney disease, stage 4 (severe): Secondary | ICD-10-CM

## 2019-07-04 DIAGNOSIS — E1143 Type 2 diabetes mellitus with diabetic autonomic (poly)neuropathy: Principal | ICD-10-CM

## 2019-07-04 LAB — MAGNESIUM
MAGNESIUM: 1.9 mg/dL (ref 1.6–2.6)
Magnesium:MCnc:Pt:Ser/Plas:Qn:: 1.9

## 2019-07-04 LAB — PRO-BNP: Natriuretic peptide.B prohormone N-Terminal:MCnc:Pt:Ser/Plas:Qn:: 5180 — ABNORMAL HIGH

## 2019-07-04 MED FILL — ACETAMINOPHEN 500 MG TABLET: 30 days supply | Qty: 180 | Fill #3 | Status: AC

## 2019-07-04 MED FILL — GABAPENTIN 300 MG CAPSULE: ORAL | 90 days supply | Qty: 180 | Fill #1

## 2019-07-04 MED FILL — EZETIMIBE 10 MG TABLET: 90 days supply | Qty: 90 | Fill #1 | Status: AC

## 2019-07-04 MED FILL — EZETIMIBE 10 MG TABLET: ORAL | 90 days supply | Qty: 90 | Fill #1

## 2019-07-04 MED FILL — GABAPENTIN 300 MG CAPSULE: 90 days supply | Qty: 180 | Fill #1 | Status: AC

## 2019-07-04 MED FILL — CARVEDILOL 25 MG TABLET: ORAL | 90 days supply | Qty: 270 | Fill #2

## 2019-07-04 MED FILL — ISOSORBIDE MONONITRATE ER 30 MG TABLET,EXTENDED RELEASE 24 HR: ORAL | 30 days supply | Qty: 120 | Fill #1

## 2019-07-04 MED FILL — CARVEDILOL 25 MG TABLET: 90 days supply | Qty: 270 | Fill #2 | Status: AC

## 2019-07-04 MED FILL — ISOSORBIDE MONONITRATE ER 30 MG TABLET,EXTENDED RELEASE 24 HR: 30 days supply | Qty: 120 | Fill #1 | Status: AC

## 2019-07-04 MED FILL — ACETAMINOPHEN 500 MG TABLET: 30 days supply | Qty: 180 | Fill #3

## 2019-07-04 NOTE — Unmapped (Signed)
CCM Intake   CCM Follow-up Call    PCP Action:None    Jeremy Oliver states pt having diuresis today.  Doesn't want to start dialysis until after visiting w. Jeremy Oliver states Nephrologist says it's ok to wait, though pt w/ CKD stageV  Jeremy Oliver eyed with not knowing how to help, they still haven't found home they were counting on, but [place] have a place in 2 weeks.  Continue to stay at a building ownded by pt's brother.    Jeremy Oliver states her health is declining as well.   States pt's BG are always in the 300s. Unsure if pt will be admitted.  Denies need for anything form CCM today.  Reviewed future appts.     CCM unable to speak directly to pt , pt currently in diuresis clinic.

## 2019-07-04 NOTE — Unmapped (Signed)
DIVISION OF NEPHROLOGY AND HYPERTENSION  [facility]  [address]  [place], [place] [address]         Date of Service: 07/04/2019      PCP: Referring Provider:   Kurtis Bushman, MD  [address] [place] [address]  [place] [address]  Phone: [phone]  Fax: [phone] Roma Schanz, MD  [address]  [place] [address] [place],  [place] [address]  Phone: [phone]  Fax: [phone]       07/04/2019    Background:??Mr Wirz was first seen by [place] in the hospital Jan, 2019 with acute on chronic kidney disease. He has CAD with repeated hospitalizations for chest pain, HFrEF, HTN, paroxysmal atrial fibrillation, DM2 and OSA (on CPAP) who was temporarily on HD during period of AKI. He is followed closely by PCP and cardiology and has ongoing CAD, but intervention will most certainly push him into renal failure and the need for RRT.  ??  He and his wife face significant social challenges without a reliable home. They are now living in a building of his nephew's for a small amount of rent.    Chief Complaint: fu CKD, HTN    HPI:  Mr. Shawn Route Seats presents for 3 mo fu CKD and HTN. I last saw him 04/07/2019. Last week he reported waking up with difficulty breathing, inability to lay flat, SOB, CP and swelling. He went to diuresis clinic M and W with good response. He had high salt intake prior.    He endorses sleeping better because he got BiPAP machine finally.    Today, he reports some improvement but still not at [facility].    Looking for new housing, looking preferably in [place] area for whatever they can afford.    Wife is near-tearful today as we begin discussing the likely events of possible admission to start dialysis.    ROS:   CONSTITUTIONAL: denies fevers or chills, denies unintentional weight loss  CARDIOVASCULAR: denies chest pain, denies dyspnea on exertion, denies leg edema  GASTROINTESTINAL: denies nausea, denies vomiting, denies anorexia GENITOURINARY: denies dysuria, denies hematuria, denies decreased urinary stream  All other systems reviewed and are negative except as listed above.    PAST MEDICAL HISTORY:  Past Medical History:   Diagnosis Date   ??? Angina at rest (CMS-[name])    ??? Arthritis    ??? Asthma    ??? Atrial fibrillation and flutter (CMS-[name]) 06/08/14   ??? BMI 40.0-44.9, adult (CMS-[name]) 10/29/2015   ??? Chronic anticoagulation 02/17/2018   ??? Chronic kidney disease, stage 3    ??? Chronic pain syndrome 04/11/2014    ~Use daily lyrica as recommended ~Tramadol as needed for pain ~Fluoxetine daily as recommended ~Daily exercise ~Eat a healthy diet ~Good control of your blood sugars can help    ??? Coronary artery disease    ??? Depression    ??? Diabetes mellitus (CMS-[name])    ??? Eye trauma 1998    glass in both eyes and removed   ??? GERD (gastroesophageal reflux disease)    ??? Gout    ??? Homeless    ??? Hyperlipidemia    ??? Hypertension    ??? Illiteracy and low-level literacy    ??? Microscopic hematuria    ??? Migraine    ??? Nephrolithiasis    ??? 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 Nonalcoholic fatty liver disease    ??? NSTEMI (non-ST elevated myocardial infarction) (CMS-[name]) 01/13/2017   ??? Obesity    ??? Obstructive sleep apnea 2009   ??? Panic attacks    ??? 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 Seizure-like activity (CMS-[name])     ~Monitor for symptoms and report them to your primary care provider if they occur    ??? Systolic CHF, chronic (CMS-[name])     EF 40-45% 2017   ??? TIA (transient ischemic attack)        PAST SURGICAL HISTORY:  Past Surgical History:   Procedure Laterality Date   ??? CARDIAC CATHETERIZATION  03/08/2007   ??? CORONARY STENT PLACEMENT     ??? CYSTOURETHROSCOPY  12/31/2009     Microscopic hematuria   ??? KNEE SURGERY Right 09/23/2006    arthroscopic knee surgery medial and lateral meniscus tears as well as crystal disease   ??? PR CATH PLACE/CORON ANGIO, IMG SUPER/INTERP,R&L HRT CATH, L HRT VENTRIC N/A 02/13/2017    Procedure: Left/Right Heart Catheterization;  Surgeon: Marlaine Hind, MD;  Location: [facility] CATH;  Service: Cardiology   ??? PR CATH PLACE/CORON ANGIO, IMG SUPER/INTERP,W LEFT HEART VENTRICULOGRAPHY N/A 03/17/2017    Procedure: Left Heart Catheterization W Intervention;  Surgeon: Marlaine Hind, MD;  Location: [facility] CATH;  Service: Cardiology   ??? PR CATH PLACE/CORON ANGIO, IMG SUPER/INTERP,W LEFT HEART VENTRICULOGRAPHY N/A 05/12/2018    Procedure: CATH LEFT HEART CATHETERIZATION W INTERVENTION;  Surgeon: Dorathy Kinsman, MD;  Location: [facility] CATH;  Service: Cardiology   ??? PR CATH PLACE/CORON ANGIO, IMG SUPER/INTERP,W LEFT HEART VENTRICULOGRAPHY N/A 10/22/2018    Procedure: CATH LEFT HEART CATHETERIZATION W INTERVENTION;  Surgeon: Marlaine Hind, MD;  Location: [facility] CATH;  Service: Cardiology   ??? PR ECMO/ECLS INITIATION VENO-ARTERIAL N/A 03/19/2017    Procedure: ECMO/ECLS; INITIATION, VENO-ARTERIAL;  Surgeon: Lennie Odor, MD;  Location: MAIN OR [facility];  Service: Cardiac Surgery   ??? PR ECMO/ECLS RMVL PRPH CANNULA OPEN 6 YRS & OLDER Left 03/25/2017    Procedure: ECMO / ECLS PROVIDED BY PHYSICIAN; REMOVAL OF PERIPHERAL (ARTERIAL AND/OR VENOUS) CANNULA(E), OPEN, 6 YEARS AND OLDER;  Surgeon: Alonna Buckler Ikonomidis, MD;  Location: MAIN OR [facility];  Service: Cardiac Surgery   ??? PR EPHYS EVAL W/ ABLATION SUPRAVENT ARRHYTHMIA N/A 07/19/2015    Catheter ablation of cavotricuspid isthmus for atrial flutter [facility])   ??? PR INSER HART PACER XVENOUS ATRIAL N/A 05/30/2015    [employer] dual-chamber pacemaker implant (E.Chung)   ??? PR INSERT/PLACE FLOW DIRECT CATH N/A 03/17/2017    Procedure: Insert Leave In [place];  Surgeon: Marlaine Hind, MD;  Location: [facility] CATH;  Service: Cardiology   ??? PR INSJ/RPLCMT PERM DFB W/TRNSVNS LDS 1/DUAL CHMBR N/A 05/04/2017    Procedure: ICD Implant System (Single/Dual);  Surgeon: Eldred Manges, MD;  Location: [facility] CATH;  Service: Cardiology   ??? PR NEGATIVE PRESSURE WOUND THERAPY DME >50 SQ CM Left 03/25/2017    Procedure: Neg Press Wound Tx (Vac Assist) Incl Topicals, Per Session, Tsa Greater Than/= 50 Cm Squared;  Surgeon: Alonna Buckler Ikonomidis, MD;  Location: MAIN OR  [facility];  Service: Cardiac Surgery   ??? PR PRQ TRLUML CORONARY STENT W/ANGIO ONE ART/BRNCH N/A 03/12/2018    Procedure: Percutaneous Coronary Intervention;  Surgeon: Marlaine Hind, MD;  Location: [facility] CATH;  Service: Cardiology   ??? PR REBL VES DIRECT,LOW EXTREM Left 03/19/2017    Procedure: Repr Bld Vessel Direct; Lower Extrem;  Surgeon: Boykin Reaper, MD;  Location: MAIN OR [facility];  Service: Vascular   ??? PR REMV ART CLOT ILIAC-POP,LEG INCIS Left 03/25/2017    Procedure: EMBOLECTOMY OR THROMBECTOMY, WITH OR WITHOUT CATHETER; FEMOROPOPLITEAL, AORTOILIAC ARTERY, BY LEG INCISION;  Surgeon: Alonna Buckler Ikonomidis, MD;  Location: MAIN OR [facility];  Service: Cardiac Surgery   ??? PR UPPER GI ENDOSCOPY,BIOPSY N/A 02/28/2016    Procedure: UGI ENDOSCOPY; WITH BIOPSY, SINGLE OR MULTIPLE;  Surgeon: Liane Comber, MD;  Location: [facility];  Service: Gastroenterology   ??? PR UPPER GI ENDOSCOPY,DIAGNOSIS N/A 05/07/2016    Procedure: UGI ENDO, INCLUDE ESOPHAGUS, STOMACH, & DUODENUM &/OR JEJUNUM; DX W/WO COLLECTION SPECIMN, BY BRUSH OR WASH;  Surgeon: Modena Nunnery, MD;  Location: [facility];  Service: Gastroenterology       SOCIAL HISTORY:  Social History     Socioeconomic History   ??? Marital status: Married     Spouse name: [name]   ??? Number of children: 3   ??? Years of education: 59   ??? Highest education level: Not on file   Occupational History   ??? Occupation: DISABLED     Comment: Former Surveyor, quantity Needs   ??? Financial resource strain: Very hard   ??? Food insecurity     Worry: Sometimes true     Inability: Sometimes true   ??? Transportation needs     Medical: Yes     Non-medical: Yes   Tobacco Use   ??? Smoking status: Never Smoker   ??? Smokeless tobacco: Current User     Types: Chew   ??? Tobacco comment: Pt in process of reducing tobacco use,  dips 1 can every 2 weeks,   Substance and Sexual Activity   ??? Alcohol use: Never     Alcohol/week: 0.0 standard drinks     Frequency: Never     Comment: rare use: couple times a year, small amount   ??? Drug use: No   ??? Sexual activity: Yes     Partners: Female   Lifestyle   ??? Physical activity     Days per week: Not on file     Minutes per session: Not on file   ??? Stress: Not on file   Relationships   ??? Social Wellsite geologist on phone: More than three times a week     Gets together: More than three times a week     Attends religious service: 1 to 4 times per year     Active member of club or organization: No     Attends meetings of clubs or organizations: Never     Relationship status: Married   Other Topics Concern   ??? Do you use sunscreen? No   ??? Tanning bed use? No   ??? Are you easily burned? No   ??? Excessive sun exposure? No   ??? Blistering sunburns? No   Social History Narrative    12/01/18: pt living with wife and friends. Friends have two young children. [name] states soon they will have their own place, however doesn't know when. Pt states it will be  after the neighbors are evicted, we can move in. Pt will need to start dialysis soon. Pt and wife Not talking with daughter and grandson lately.             Information updated:  01/08/16. Reviewed 08/12/17:        Born in [place]    Raised with both parents    Parents died w/in 6 mos of each other. His parents knew they were going to pass and told him just before.    Half sister and 2 half brothers Married 30 yrs. Wife [name]    2 other children son and daughter, adopted out    Daughter Bridgette, bf DJ        Education - completed through 11th. 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    Former Presenter, broadcasting since age 4    Lives with wife, difficulty paying bills 12/30/17     Lives next door to daughter, husband and their grandson        05/19/18: pt moving to new home in next 5 days- after an eviction. Has Medicaid assistance again. Will be w/ wife [name], they babysit their grandson often.    06/14/18: Pt and husband living with daughter while new location to live is determined.       FAMILY HISTORY:  Unchanged from previous visit.  Family History   Problem Relation Age of Onset   ??? Diabetes Mother    ??? Hyperlipidemia Mother    ??? Heart disease Mother    ??? Basal cell carcinoma Mother    ??? Blindness Mother         diabeties   ??? Obesity Mother    ??? Hyperlipidemia Father    ??? Heart disease Father    ??? Lung disease Father    ??? Heart disease Half-Sister    ??? Kidney disease Half-Sister    ??? Gallbladder disease Half-Brother    ??? Obesity Half-Brother    ??? No Known Problems Daughter    ??? No Known Problems Son    ??? Obesity Daughter    ??? Melanoma Neg Hx    ??? Squamous cell carcinoma Neg Hx    ??? Macular degeneration Neg Hx    ??? Glaucoma Neg Hx        ALLERGIES:  Losartan and Opioids - morphine analogues    MEDICATIONS:  Current Outpatient Medications   Medication Sig Dispense Refill   ??? acetaminophen (TYLENOL) 500 MG tablet TAKE 2 TABLETS (1 000MG ) BY MOUTH EVERY 8 HOURS AS NEEDED FOR PAIN 180 each 3   ??? albuterol HFA 90 mcg/actuation inhaler Inhale 2 puffs into the lungs every 6 (six) hours as needed for wheezing or shortness of breath. 18 g 2   ??? alirocumab (PRALUENT) 150 mg/mL subcutaneous injection Inject the contents of 1 pen (150 mg total) under the skin every fourteen (14) days. 6 mL 3   ??? allopurinoL (ZYLOPRIM) 100 MG tablet Take 2 tablets (200 mg total) by mouth daily. 60 tablet 5   ??? amiodarone (PACERONE) 200 MG tablet TAKE 1 TABLET BY MOUTH ONCE DAILY 90 tablet 3   ??? amLODIPine (NORVASC) 10 MG tablet Take 1 tablet (10 mg total) by mouth daily. 90 tablet 3   ??? atorvastatin (LIPITOR) 80 MG tablet  Take 1 tablet (80 mg total) by mouth daily. 90 tablet 3   ??? blood sugar diagnostic Strp Disp to match current ins approved meter. Use to test blood sugar four times daily. E11.22, Z79.4 400 each 3   ??? blood-glucose meter kit Disp. blood glucose meter kit preferred by patient's insurance. Dx: Diabetes, E11.9 1 each 11   ??? bumetanide (BUMEX) 2 MG tablet Take 2.5 tablets (5 mg total) by mouth two (2) times a day. 450 tablet 3   ??? carvediloL (COREG) 25 MG tablet Take 1.5 tablets (37.5 mg total) by mouth Two (2) times a day. 270 tablet 3   ??? cholecalciferol, vitamin D3, 5,000 unit capsule Take 1 capsule (5,000 Units total) by mouth daily. 100 capsule 0   ??? diclofenac sodium (VOLTAREN) 1 % gel Apply 2 grams topically Three (3) times a day. 180 g 11   ??? empty container Misc Use as directed to dispose of injectable medications 1 each 3   ??? ezetimibe (ZETIA) 10 mg tablet TAKE 1 TABLET BY MOUTH ONCE DAILY 90 each 3   ??? FLUoxetine (PROZAC) 40 MG capsule Take 2 capsules (80 mg total) by mouth daily. 180 capsule 3   ??? gabapentin (NEURONTIN) 300 MG capsule Take 1 capsule (300 mg total) by mouth two (2) times a day. 180 capsule 3   ??? insulin ASPART (NOVOLOG FLEXPEN U-100 INSULIN) 100 unit/mL (3 mL) injection pen Inject 20 units under the skin three (3) times a day with a meal. 15 mL 11   ??? insulin detemir U-100 (LEVEMIR) 100 unit/mL (3 mL) injection pen Inject 45 Units total under the skin nightly. 15 mL 3   ??? insulin syringe-needle U-100 0.3 mL 31 gauge x 5/16 Syrg Use to inject insulin twice daily with meals 60 each 0   ??? isosorbide mononitrate (IMDUR) 30 MG 24 hr tablet Take 4 tablets (120 mg total) by mouth daily. 120 tablet 3 ??? lancets Misc Disp. Ins preferred lancets. Use to test blood sugar four times daily. E11.22, Z79.4 400 each 3   ??? loperamide (IMODIUM) 2 mg capsule Take 1 capsule (2 mg total) by mouth 4 (four) times a day as needed for diarrhea. 30 capsule 0   ??? miscellaneous medical supply (BLOOD PRESSURE CUFF) Misc Measure BP once a day or if any symptoms 1 each 0   ??? nitroglycerin (NITROSTAT) 0.4 MG SL tablet Place 1 tablet (0.4 mg total) under the tongue every five (5) minutes as needed for chest pain. 100 each 3   ??? omeprazole (PRILOSEC) 40 MG capsule Take 1 capsule (40 mg total) by mouth Two (2) times a day (30 minutes before a meal). 180 capsule 3   ??? ondansetron (ZOFRAN) 4 MG tablet Take 1 tablet (4 mg total) by mouth every eight (8) hours as needed for nausea for up to 4 days. 10 tablet 0   ??? rivaroxaban (XARELTO) 15 mg Tab Take 1 tablet (15 mg total) by mouth daily with evening meal. 90 tablet 3   ??? semaglutide 0.25 mg or 0.5 mg(2 mg/1.5 mL) PnIj Inject 0.5 mg under the skin every seven (7) days. 4.5 mL 3   ??? sevelamer (RENVELA) 800 mg tablet TAKE 2 TABLETS (1600MG ) BY MOUTH THREE TIMES DAILY WITH MEALS 540 tablet 3   ??? sodium polystyrene, SPS, with sorbitol (SPS, WITH SORBITOL,) 15-20 gram/60 mL Susp Take 60 mL (15 g) by mouth once a week. 473 mL 3   ??? ticagrelor (BRILINTA) 90 mg Tab TAKE  1 TABLET BY MOUTH TWICE DAILY 180 tablet 3   ??? UNIFINE PENTIPS 31 gauge x 5/16 (8 mm) Ndle USE WITH INSULIN AND VICTOZA 5 TIMES DAILY 400 each 3   ??? hydrALAZINE (APRESOLINE) 10 MG tablet Take 0.5 tablet (5 mg total) by mouth three (3) times a day. (Patient not taking: Reported on 07/04/2019) 45 tablet 11   ??? ondansetron (ZOFRAN) 4 MG tablet Take 1 tablet (4 mg total) by mouth every eight (8) hours as needed for nausea. 30 tablet 0     No current facility-administered medications for this visit.        PHYSICAL EXAM:  Vitals:    07/04/19 1015   BP: 179/90   Pulse: 77   Temp: 36.6 ??C (97.9 ??F)     Body mass index is 49.98 kg/m??. CONSTITUTIONAL: Alert,well appearing, no distress  HEENT: Moist mucous membranes, oropharynx clear without erythema or exudate  NECK: Supple, no lymphadenopathy  CARDIOVASCULAR: Regular, normal S1/S2 heart sounds, no murmurs, no rubs.   PULM: Clear to auscultation bilaterally  GASTROINTESTINAL: Soft, active bowel sounds, nontender  MSK/EXTREMITIES: No lower extremity edema bilaterally, dorsalis pedis pulses 2+ bilaterally   SKIN: No rashes or lesions  NEUROLOGIC: No focal motor or sensory deficits    BP Readings from Last 5 Encounters:   07/04/19 179/90   06/29/19 145/75   06/29/19 159/78   06/27/19 171/88   06/27/19 161/87       MEDICAL DECISION MAKING    Results for orders placed or performed in visit on 06/29/19   Magnesium Level   Result Value Ref Range    Magnesium 1.9 1.6 - 2.6 mg/dL   Pro-BNP   Result Value Ref Range    PRO-BNP 3,960.0 (H) 0.0 - 138.0 pg/mL   POCT Basic Metabolic Panel   Result Value Ref Range    Sodium, POCT 136 135 - 145 mmol/L    Potassium, POCT 5.2 (H) 3.5 - 5.0 mmol/L    Chloride, POCT 105 98 - 107 mmol/L    CO2, POCT 25 22 - 30 mmol/L    BUN, POCT 42 (H) 7 - 21 mg/dL    Creatinine, POCT 5.1 (H) 0.7 - 1.3 mg/dL    Glucose, POCT 316 (H) 70 - 179 mg/dL    Calcium, POCT 8.0 (L) 8.5 - 10.2 mg/dL     *Note: Due to a large number of results and/or encounters for the requested time period, some results have not been displayed. A complete set of results can be found in Results Review.        Lab Results   Component Value Date    NA 136 06/29/2019    K 5.2 (H) 06/29/2019    CL 105 06/29/2019    CO2 25 06/29/2019    BUN 42 (H) 06/29/2019    CREATININE 5.1 (H) 06/29/2019    CREATININE 5.06 (H) 06/27/2019    CREATININE 4.70 (H) 04/14/2019    CREATININE 4.92 (H) 04/01/2019    CREATININE 4.4 (H) 02/02/2019    CREATININE 3.9 (H) 01/26/2019    GFR >= 60 01/09/2012    GFR >= 60 09/17/2011    GFR >= 60 09/06/2011    GFR >= 60 06/09/2011    GFR >= 60 05/12/2011    GFR >= 60 01/09/2011     Lab Results Component Value Date    PCRATIOUR 12.217 04/07/2019    PCRATIOUR 1.669 03/17/2017    ALBUMIN 3.1 (L) 04/14/2019    WBC 9.4 04/01/2019  PLT 260 04/01/2019     No components found for: LASTPTH  Lab Results   Component Value Date    WBC 9.4 04/01/2019    HGB 11.8 (L) 04/01/2019    HCT 35.3 (L) 04/01/2019    PLT 260 04/01/2019     Wt Readings from Last 3 Encounters:   07/04/19 (!) 176.6 kg (389 lb 4.8 oz)   06/29/19 (!) 172.8 kg (380 lb 14.4 oz)   06/29/19 (!) 174.5 kg (384 lb 9.6 oz)         ASSESSMENT/PLAN:  Mr.Mackinley [name] Delamora is a 49 y.o. year old patient with   1. CKD (chronic kidney disease) stage 5, GFR less than 15 ml/min (CMS-[name])    2. Type 2 diabetes mellitus with stage 4 chronic kidney disease, with long-term current use of insulin (CMS-[name])    3. Essential hypertension    4. Coronary artery disease, angina presence unspecified, unspecified vessel or lesion type, unspecified whether native or transplanted heart        1. CKD V (G5/A3). 2/2 longstanding and poorly controlled HTN and DM2. Last week he awoke with volume overload after a salt load at [employer]. He was able to get to diuresis clinic and avoid ER. He had a nice diuretic response and is stable now, but weight has gone up again over the weekend despite continued UOP. I have concerns that this [place] be the approach of need for RRT. Additionally as d/w his cardiologist, he needs a catheterization, but this cannot be done until he is placed on dialysis as the dye load will certainly cause kidney failure. Had long d/w pt and wife today about options. Mr Pavlak is in full understanding of the situation and was able to explain this situation in his own words. Ms Urbieta was understandably very anxious, having gone through him very sick in the ICU, temporarily on RRT in the past. She was not clear on why he needs 2 surgeries - one for catheter placement and one later for graft placement. They also expressed his wishes to travel to [place] to see his grandchild, [facility], before any major events happen. They have requested another visit with Cicero Duck of our [facility].  - will communicate with diuresis clinic to get him back in this week, possibly multiple times  - plan for them to go to [facility] this weekend then touch base with me again next week about whether he is ready to start dialysis  - if we start, I would like him to be admitted as he is high risk - he would need tunneled catheter placement and initiation protocol with plans for transition to more permanent access once we get him better optimized to tolerate surgery (I.e. fluid status, cardiac status)  - in the meantime, I will communicate with his providers to make sure we are on the same page about dialysis initiation, catheterization, dialysis access, etc  - they have requested to speak with Herbert Seta again in preparation for potential initiation of dialysis    2. HTN. BP elevated today. 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 PTH up to 400 now. Yasin need to start calcitriol soon if not on RRT.  - continue sevelamer  1600mg  tid with meals  - continue cholecalciferol 5000 U daily    4. Anemia. Hgb ok so far, not requiring ESAs    5. Electrolytes. K slightly up, CO2 ok. CTM with diuresis.    6. Prevention. He is on a statin.    7. DM2. On levemir, novolog and ozempic and tolerating. Last hgba1c 7.2% still above goal but improving. Continue current regimen.    Mr.Chaise [name] Teel will follow up in Return in about 4 weeks (around 08/01/2019) for Next scheduled follow up. or sooner as needed. Unless he is admitted for dialysis initiation.    I personally spent 55 minutes face-to-face and non-face-to-face in the care of this patient, which includes all pre, intra, and post visit time on the date of service.

## 2019-07-04 NOTE — Unmapped (Addendum)
IV Diuresis Clinic Discharge Instructions:  ??  MEDICATIONS:  Continue Bumex 5 mg twice daily   Will discuss with Irving Burton adding a dose of medication called metolazone 2.5 mg before your visit on Wednesday   ??  IMPORTANT INSTRUCTIONS:  - Weigh yourself daily in the morning; write your weight down and bring it with you next time   - Limit your fluid intake to 2 Liters (half-gallon) per day    - Limit your salt intake to 2-3 grams (2000-3000 mg) per day  - If you have a morning diuresis appointment, don't take your morning diuretic  - If you have an afternoon diuresis appointment, take your morning diuretic  ??  LABS:  Creatinine is elevated but stable  ??  NEXT APPOINTMENT:  Return to clinic on Wednesday morning at 8am   Please call us if you need to come back to the diuresis clinic.   ??  The Cardiology Clinic has moved. Our new address is [address], [place], [place] [address], 2nd floor.   ??  My office number (c/o De Burrs RN) is [phone] if you need further assistance, or need to schedule with our IV Diuresis clinic in the future.  ??  If you need to reschedule future appointments, please call [phone] or [phone]. After office hours, if you have urgent questions/problems, contact the on-call cardiologist through the hospital operator: [phone].

## 2019-07-04 NOTE — Unmapped (Addendum)
I am sending you back to diuresis clinic this week. [place] need multiple visits.    Let's touch base in the coming weeks about starting dialysis in the hospital.    I will communicate with your various providers as we work out the plan.

## 2019-07-04 NOTE — Unmapped (Signed)
[facility] Note    Primary Cardiologist/Cardiology Provider(s):  Dr. Hoy Finlay  PCP:  Kurtis Bushman, MD    Last Cardiology Clinic Visit:  06/29/2019  Last IV Diuresis Visit: 06/29/2019    Reason for Visit:  Jeremy Oliver is a 49 y.o. male with a history of combined systolic and diastolic heart failure, who is being seen today in the [facility] IV [facility]uresis Clinic for an urgent visit for volume status optimization including IV diuresis.    Assessment/Plan:  1. Acute on Chronic combined systolic and diastolic heart failure, acute on chronic kidney disease  - Volume status today is worse.  - NYHA Class III  - IV Lasix 160 mg was administered during clinic with good response Weight 388 down to 383 with one IV dose  - Labs were obtained: Cr 5.0, K 5.3  - his living situation is likely contributing to his inability to be compliant with diet and weight monitoring   - continue Bumex 5 mg twice daily, metolazone 2.5 mg on Wednesday morning when he arrives to clinic.   - Return to IV diuresis on Wednesday at 8am    Response to IV diuresis:         Medications Heart rate Blood pressure Weight (lbs)   Pre  80 150/80 388   Hour 0 IV Lasix 160 mg      Hour 2       Post      383       Output:  Net IO: -1,354 mL [07/04/19 1825]    Return to clinic:    Return in about 2 days (around 07/06/2019).    Future Appointments   Date Time Provider Department Center   07/06/2019  8:00 AM DIURESIS HF NP  [facility]   07/14/2019  3:00 PM Marlaine Hind, MD [facility]   07/19/2019  1:00 PM Diane R Dolan-Soto, LCSW [facility]   07/26/2019  8:15 AM Huntley EP REMOTE MONITORING [facility]   08/02/2019 12:00 PM Pallav Carlean Purl, MD [facility]   08/03/2019  8:20 AM Roma Schanz, MD [facility]   08/03/2019 10:00 AM Mila Merry, MD [facility]   01/16/2020  8:30 AM Monica Martinez, MD [facility]       History of Present Illness:  Jeremy Oliver is a 49 y.o. male with a history of combined systolic and diastolic heart failure who presents today for IV diuresis. He also has a history of significant CAD. Patient last seen in clinic on 06/29/2019. Since that time, he has gained the 4 pounds he lost last week during IV diuresis and gained an additional 2 pounds.  He was last seen in the IV diuresis clinic on 06/29/2019 and was given furosemide 160 mg IV x2.  He had a good response despite his chronic kidney disease.  He had an improvement in symptoms.  Over the last few days he has noted more orthopnea over the past 2 nights despite being on BiPAP.  He was seen by Dr. Cherly Hensen in nephrology this morning who referred him back to IV diuresis.  He has generally not been weighing at home and endorses shortness of breath with walking short distances.  He was able to walk from his car in the parking deck to the clinic lobby but needed a wheelchair to get the rest of the way into the clinic.  He has lower extremity  edema to his knee.  He is taking Bumex 5 mg twice daily.  His living situation is unfortunately tenuous.  He is still living in a building owned by his nephew and does not have refrigerator or kitchen.  He is unable to cook full meals and keep fresh food.  He reports over the weekend he ate a slice of pizza that was supreme as well as have a salad with ranch dressing.  He is drinking several sodas.  He did not start hydralazine as recommended last week.    Past Medical History:   Diagnosis Date   ??? Angina at rest (CMS-[name])    ??? Arthritis    ??? Asthma    ??? Atrial fibrillation and flutter (CMS-[name]) 06/08/14   ??? BMI 40.0-44.9, adult (CMS-[name]) 10/29/2015   ??? Chronic anticoagulation 02/17/2018   ??? Chronic kidney disease, stage 3    ??? Chronic pain syndrome 04/11/2014    ~Use daily lyrica as recommended ~Tramadol as needed for pain ~Fluoxetine daily as recommended ~Daily exercise ~Eat a healthy diet ~Good control of your blood sugars can help    ??? Coronary artery disease    ??? Depression    ??? Diabetes mellitus (CMS-[name])    ??? Eye trauma 1998    glass in both eyes and removed   ??? GERD (gastroesophageal reflux disease)    ??? Gout    ??? Homeless    ??? Hyperlipidemia    ??? Hypertension    ??? Illiteracy and low-level literacy    ??? Microscopic hematuria    ??? Migraine    ??? Nephrolithiasis    ??? 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 Nonalcoholic fatty liver disease    ??? NSTEMI (non-ST elevated myocardial infarction) (CMS-[name]) 01/13/2017   ??? Obesity    ??? Obstructive sleep apnea 2009   ??? Panic attacks    ??? 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 Seizure-like activity (CMS-[name])     ~Monitor for symptoms and report them to your primary care provider if they occur    ??? Systolic CHF, chronic (CMS-[name])     EF 40-45% 2017   ??? TIA (transient ischemic attack)      Past Surgical History:   Procedure Laterality Date   ??? CARDIAC CATHETERIZATION  03/08/2007   ??? CORONARY STENT PLACEMENT     ??? CYSTOURETHROSCOPY  12/31/2009     Microscopic hematuria   ??? KNEE SURGERY Right 09/23/2006    arthroscopic knee surgery medial and lateral meniscus tears as well as crystal disease   ??? PR CATH PLACE/CORON ANGIO, IMG SUPER/INTERP,R&L HRT CATH, L HRT VENTRIC N/A 02/13/2017    Procedure: Left/Right Heart Catheterization;  Surgeon: Marlaine Hind, MD;  Location: [facility] CATH;  Service: Cardiology   ??? PR CATH PLACE/CORON ANGIO, IMG SUPER/INTERP,W LEFT HEART VENTRICULOGRAPHY N/A 03/17/2017    Procedure: Left Heart Catheterization W Intervention;  Surgeon: Marlaine Hind, MD;  Location: [facility] CATH;  Service: Cardiology   ??? PR CATH PLACE/CORON ANGIO, IMG SUPER/INTERP,W LEFT HEART VENTRICULOGRAPHY N/A 05/12/2018    Procedure: CATH LEFT HEART CATHETERIZATION  W INTERVENTION;  Surgeon: Dorathy Kinsman, MD;  Location: [facility] CATH;  Service: Cardiology   ??? PR CATH PLACE/CORON ANGIO, IMG SUPER/INTERP,W LEFT HEART VENTRICULOGRAPHY N/A 10/22/2018    Procedure: CATH LEFT HEART CATHETERIZATION W INTERVENTION;  Surgeon: Marlaine Hind, MD;  Location: [facility] CATH;  Service: Cardiology   ??? PR ECMO/ECLS INITIATION VENO-ARTERIAL N/A 03/19/2017    Procedure: ECMO/ECLS; INITIATION, VENO-ARTERIAL;  Surgeon: Lennie Odor, MD;  Location: [facility] OR [facility];  Service: Cardiac Surgery   ??? PR ECMO/ECLS RMVL PRPH CANNULA OPEN 6 YRS & OLDER Left 03/25/2017    Procedure: ECMO / ECLS PROVIDED BY PHYSICIAN; REMOVAL OF PERIPHERAL (ARTERIAL AND/OR VENOUS) CANNULA(E), OPEN, 6 YEARS AND OLDER;  Surgeon: Alonna Buckler Ikonomidis, MD;  Location: MAIN OR [facility];  Service: Cardiac Surgery   ??? PR EPHYS EVAL W/ ABLATION SUPRAVENT ARRHYTHMIA N/A 07/19/2015    Catheter ablation of cavotricuspid isthmus for atrial flutter [facility])   ??? PR INSER HART PACER XVENOUS ATRIAL N/A 05/30/2015    [employer] dual-chamber pacemaker implant (E.Chung)   ??? PR INSERT/PLACE FLOW DIRECT CATH N/A 03/17/2017    Procedure: Insert Leave In [place];  Surgeon: Marlaine Hind, MD;  Location: [facility] CATH;  Service: Cardiology   ??? PR INSJ/RPLCMT PERM DFB W/TRNSVNS LDS 1/DUAL CHMBR N/A 05/04/2017    Procedure: ICD Implant System (Single/Dual);  Surgeon: Eldred Manges, MD;  Location: [facility] CATH;  Service: Cardiology   ??? PR NEGATIVE PRESSURE WOUND THERAPY DME >50 SQ CM Left 03/25/2017    Procedure: Neg Press Wound Tx (Vac Assist) Incl Topicals, Per Session, Tsa Greater Than/= 50 Cm Squared;  Surgeon: Alonna Buckler Ikonomidis, MD;  Location: MAIN OR [facility];  Service: Cardiac Surgery   ??? PR PRQ TRLUML CORONARY STENT W/ANGIO ONE ART/BRNCH N/A 03/12/2018    Procedure: Percutaneous Coronary Intervention;  Surgeon: Marlaine Hind, MD;  Location: [facility] CATH;  Service: Cardiology   ??? PR REBL VES DIRECT,LOW EXTREM Left 03/19/2017    Procedure: Repr Bld Vessel Direct; Lower Extrem;  Surgeon: Boykin Reaper, MD;  Location: MAIN OR [facility];  Service: Vascular   ??? PR REMV ART CLOT ILIAC-POP,LEG INCIS Left 03/25/2017    Procedure: EMBOLECTOMY OR THROMBECTOMY, WITH OR WITHOUT CATHETER; FEMOROPOPLITEAL, AORTOILIAC ARTERY, BY LEG INCISION;  Surgeon: Alonna Buckler Ikonomidis, MD;  Location: MAIN OR [facility];  Service: Cardiac Surgery   ??? PR UPPER GI ENDOSCOPY,BIOPSY N/A 02/28/2016    Procedure: UGI ENDOSCOPY; WITH BIOPSY, SINGLE OR MULTIPLE;  Surgeon: Liane Comber, MD;  [facility]: [facility];  Service: Gastroenterology   ??? PR UPPER GI ENDOSCOPY,DIAGNOSIS N/A 05/07/2016    Procedure: UGI ENDO, INCLUDE ESOPHAGUS, STOMACH, & DUODENUM &/OR JEJUNUM; DX W/WO COLLECTION SPECIMN, BY BRUSH OR WASH;  Surgeon: Modena Nunnery, MD;  Location: [facility];  Service: Gastroenterology       Current Outpatient Medications   Medication Sig Dispense Refill   ??? acetaminophen (TYLENOL) 500 MG tablet TAKE 2 TABLETS (1 000MG ) BY MOUTH EVERY 8 HOURS AS NEEDED FOR PAIN 180 each 3   ??? albuterol HFA 90 mcg/actuation inhaler Inhale 2 puffs into the lungs every 6 (six) hours as needed for wheezing or shortness of breath. 18 g 2   ??? alirocumab (PRALUENT) 150 mg/mL subcutaneous injection Inject the contents of 1 pen (150 mg total) under the skin every fourteen (14) days. 6 mL 3   ??? allopurinoL (ZYLOPRIM) 100 MG tablet Take 2 tablets (200 mg total) by mouth daily. 60 tablet 5   ???  amiodarone (PACERONE) 200 MG tablet TAKE 1 TABLET BY MOUTH ONCE DAILY 90 tablet 3   ??? amLODIPine (NORVASC) 10 MG tablet Take 1 tablet (10 mg total) by mouth daily. 90 tablet 3   ??? atorvastatin (LIPITOR) 80 MG tablet Take 1 tablet (80 mg total) by mouth daily. 90 tablet 3   ??? bumetanide (BUMEX) 2 MG tablet Take 2.5 tablets (5 mg total) by mouth two (2) times a day. 450 tablet 3   ??? carvediloL (COREG) 25 MG tablet Take 1.5 tablets (37.5 mg total) by mouth Two (2) times a day. 270 tablet 3   ??? cholecalciferol, vitamin D3, 5,000 unit capsule Take 1 capsule (5,000 Units total) by mouth daily. 100 capsule 0   ??? diclofenac sodium (VOLTAREN) 1 % gel Apply 2 grams topically Three (3) times a day. 180 g 11   ??? ezetimibe (ZETIA) 10 mg tablet TAKE 1 TABLET BY MOUTH ONCE DAILY 90 each 3   ??? FLUoxetine (PROZAC) 40 MG capsule Take 2 capsules (80 mg total) by mouth daily. 180 capsule 3   ??? gabapentin (NEURONTIN) 300 MG capsule Take 1 capsule (300 mg total) by mouth two (2) times a day. 180 capsule 3   ??? insulin ASPART (NOVOLOG FLEXPEN U-100 INSULIN) 100 unit/mL (3 mL) injection pen Inject 20 units under the skin three (3) times a day with a meal. 15 mL 11   ??? insulin detemir U-100 (LEVEMIR) 100 unit/mL (3 mL) injection pen Inject 45 Units total under the skin nightly. 15 mL 3   ??? isosorbide mononitrate (IMDUR) 30 MG 24 hr tablet Take 4 tablets (120 mg total) by mouth daily. 120 tablet 3   ??? loperamide (IMODIUM) 2 mg capsule Take 1 capsule (2 mg total) by mouth 4 (four) times a day as needed for diarrhea. 30 capsule 0   ??? nitroglycerin (NITROSTAT) 0.4 MG SL tablet Place 1 tablet (0.4 mg total) under the tongue every five (5) minutes as needed for chest pain. 100 each 3   ??? omeprazole (PRILOSEC) 40 MG capsule Take 1 capsule (40 mg total) by mouth Two (2) times a day (30 minutes before a meal). 180 capsule 3   ??? ondansetron (ZOFRAN) 4 MG tablet Take 1 tablet (4 mg total) by mouth every eight (8) hours as needed for nausea for up to 4 days. 10 tablet 0   ??? ondansetron (ZOFRAN) 4 MG tablet Take 1 tablet (4 mg total) by mouth every eight (8) hours as needed for nausea. 30 tablet 0   ??? rivaroxaban (XARELTO) 15 mg Tab Take 1 tablet (15 mg total) by mouth daily with evening meal. 90 tablet 3   ??? semaglutide 0.25 mg or 0.5 mg(2 mg/1.5 mL) PnIj Inject 0.5 mg under the skin every seven (7) days. 4.5 mL 3   ??? sevelamer (RENVELA) 800 mg tablet TAKE 2 TABLETS (1600MG ) BY MOUTH THREE TIMES DAILY WITH MEALS 540 tablet 3   ??? sodium polystyrene, SPS, with sorbitol (SPS, WITH SORBITOL,) 15-20 gram/60 mL Susp Take 60 mL (15 g) by mouth once a week. 473 mL 3   ??? ticagrelor (BRILINTA) 90 mg Tab TAKE 1 TABLET BY MOUTH TWICE DAILY 180 tablet 3   ??? UNIFINE PENTIPS 31 gauge x 5/16 (8 mm) Ndle USE WITH INSULIN AND VICTOZA 5 TIMES DAILY 400 each 3   ??? blood sugar diagnostic Strp Disp to match current ins approved meter. Use to test blood sugar four times daily. E11.22, Z79.4 400 each 3   ???  blood-glucose meter kit Disp. blood glucose meter kit preferred by patient's insurance. Dx: Diabetes, E11.9 1 each 11   ??? hydrALAZINE (APRESOLINE) 10 MG tablet Take 0.5 tablet (5 mg total) by mouth three (3) times a day. (Patient not taking: Reported on 07/04/2019) 45 tablet 11   ??? insulin syringe-needle U-100 0.3 mL 31 gauge x 5/16 Syrg Use to inject insulin twice daily with meals 60 each 0   ??? lancets Misc Disp. Ins preferred lancets. Use to test blood sugar four times daily. E11.22, Z79.4 400 each 3   ??? miscellaneous medical supply (BLOOD PRESSURE CUFF) Misc Measure BP once a day or if any symptoms 1 each 0     No current facility-administered medications for this visit.      Allergies   Allergen Reactions   ??? Losartan      Hyperkalemia (K>6)   ??? Opioids - Morphine Analogues Anxiety       Objective:       Physical Exam  BP 150/80  - Pulse 80  - Temp 36.2 ??C (97.1 ??F)  - Resp 20  - Ht 188 cm (6' 2)  - Wt (!) 174.1 kg (383 lb 12.8 oz) Comment: POST LASIX 160 MG IV - SpO2 90% Comment: SNORING RESPIRATIONS - BMI 49.28 kg/m??    Wt Readings from Last 12 Encounters:   07/04/19 (!) 174.1 kg (383 lb 12.8 oz)   07/04/19 (!) 176.6 kg (389 lb 4.8 oz)   06/29/19 (!) 172.8 kg (380 lb 14.4 oz)   06/29/19 (!) 174.5 kg (384 lb 9.6 oz)   06/27/19 (!) 173.7 kg (383 lb)   06/27/19 (!) 174.8 kg (385 lb 6.4 oz)   05/10/19 (!) 162.1 kg (357 lb 6.4 oz)   04/14/19 (!) 159 kg (350 lb 9.6 oz)   04/12/19 (!) 158.3 kg (348 lb 14.4 oz)   04/07/19 (!) 157 kg (346 lb 3.2 oz)   02/02/19 (!) 162.8 kg (358 lb 14.4 oz)   01/31/19 (!) 164.2 kg (362 lb)       General:  Patient is chronically ill-appearing in no acute distress   Eyes:  Intact, sclerae anicteric.   Ears, nose, mouth: Benign   Respiratory:   Normal respiratory effort. Clear to auscultation bilaterally.  There are no wheezes.   Cardiovascular:  JVP not seen above the clavicle with HOB at 90 degrees.  Rate and rhythm are regular.  There is no lifts or heaves.  Normal S1, S2. There is no murmur, gallops or rubs, Radial and pedal pulses are 1-2+, bilaterally.   There is 2+ pitting pedal/pretibial edema, bilaterally.    Gastrointestinal:   Soft, non-tender, with audible bowel sounds. Abdomen distended.  Liver is nonpalpable.   Musculoskeletal: No joint swelling   Skin: Warm, well perfused.   Neurologic: Appropriate mood and affect. Alert and oriented to person, place, and time. No gross motor or sensory deficits evident.     Recent Labs:  Office Visit on 07/04/2019   Component Date Value Ref Range Status   ??? Magnesium 07/04/2019 1.9  1.6 - 2.6 mg/dL Final   ??? Sodium, POCT 07/04/2019 138  135 - 145 mmol/L Final   ??? Potassium, POCT 07/04/2019 5.3* 3.5 - 5.0 mmol/L Final   ??? Chloride, POCT 07/04/2019 102  98 - 107 mmol/L Final   ??? CO2, POCT 07/04/2019 23  22 - 30 mmol/L Final   ??? BUN, POCT 07/04/2019 44* 7 - 21 mg/dL Final   ??? Creatinine, POCT 07/04/2019  5.0* 0.7 - 1.3 mg/dL Final   ??? Glucose, POCT 07/04/2019 306* 70 - 179 mg/dL Final   ??? Calcium, POCT 07/04/2019 8.0* 8.5 - 10.2 mg/dL Final   Office Visit on 06/29/2019   Component Date Value Ref Range Status   ??? Magnesium 06/29/2019 1.9  1.6 - 2.6 mg/dL Final   ??? PRO-BNP 16/12/9602 3,960.0* 0.0 - 138.0 pg/mL Final   ??? Sodium, POCT 06/29/2019 136  135 - 145 mmol/L Final   ??? Potassium, POCT 06/29/2019 5.2* 3.5 - 5.0 mmol/L Final   ??? Chloride, POCT 06/29/2019 105  98 - 107 mmol/L Final   ??? CO2, POCT 06/29/2019 25  22 - 30 mmol/L Final   ??? BUN, POCT 06/29/2019 42* 7 - 21 mg/dL Final   ??? Creatinine, POCT 06/29/2019 5.1* 0.7 - 1.3 mg/dL Final   ??? Glucose, POCT 06/29/2019 316* 70 - 179 mg/dL Final   ??? Calcium, POCT 06/29/2019 8.0* 8.5 - 10.2 mg/dL Final       Lab Results   Component Value Date    PRO-BNP 3,960.0 (H) 06/29/2019    PRO-BNP 4,671.0 (H) 06/27/2019    PRO-BNP 4,620.0 (H) 04/14/2019    PRO-BNP 2,430.0 (H) 02/02/2019    PRO-BNP 3,670.0 (H) 01/26/2019    PRO-BNP 231 (H) 06/08/2014    Creatinine, POCT 5.0 (H) 07/04/2019    Creatinine, POCT 5.1 (H) 06/29/2019    Creatinine, POCT 4.4 (H) 02/02/2019    Creatinine, POCT 3.9 (H) 01/26/2019    Creatinine, POCT 3.6 (H) 01/24/2019    Creatinine 5.06 (H) 06/27/2019    Creatinine 4.70 (H) 04/14/2019    Creatinine 4.92 (H) 04/01/2019    BUN 41 (H) 06/27/2019    BUN 38 (H) 04/14/2019    BUN 32 (H) 04/01/2019    BUN, POCT 44 (H) 07/04/2019    BUN, POCT 42 (H) 06/29/2019    BUN, POCT 27 (H) 02/02/2019    BUN, POCT 27 (H) 01/26/2019    BUN, POCT 28 (H) 01/24/2019    Magnesium 1.9 07/04/2019    Magnesium 1.9 06/29/2019    Magnesium 1.8 06/27/2019    Magnesium 1.9 02/02/2019    Magnesium 1.6 01/26/2019    Magnesium 2.0 06/09/2014    Magnesium 1.7 05/12/2011    Magnesium 1.8 01/09/2011       Metric Tracker:  Did today's visit result in ED visit? No  Did today's visit result in hospital admission? No  Did today's visit result in referral to cardiology? n/a  Today's visit was a referral from Nephrology

## 2019-07-05 DIAGNOSIS — E7800 Pure hypercholesterolemia, unspecified: Principal | ICD-10-CM

## 2019-07-05 DIAGNOSIS — I2510 Atherosclerotic heart disease of native coronary artery without angina pectoris: Principal | ICD-10-CM

## 2019-07-05 NOTE — Unmapped (Signed)
I was the supervising physician in the delivery of the service. Shaeleigh Graw D Bryant Saye, MD

## 2019-07-05 NOTE — Unmapped (Signed)
[facility] Specialty Pharm[facility] Refill Coordination Note    Specialty Medication(s) to be Shipped:   General Specialty: Praluent    Other medication(s) to be shipped: N/A     Jeremy Oliver, DOB: October 26, 1970  Phone: [phone] (home)       All above HIPAA information was verified with patient's family member, Jeremy Oliver.     Was a Nurse, learning disability used for this call? No    Completed refill call assessment today to schedule patient's medication shipment from the [facility] [phone]).       Specialty medication(s) and dose(s) confirmed: Regimen is correct and unchanged.   Changes to medications: Jeremy Oliver reports starting the following medications: Increase in Allopurinol  Changes to insurance: No  Questions for the pharmacist: No    Confirmed patient received Welcome Packet with first shipment. The patient will receive a drug information handout for each medication shipped and additional FDA Medication Guides as required.       DISEASE/MEDICATION-SPECIFIC INFORMATION        For patients on injectable medications: Patient currently has 1 doses left.  Next injection is scheduled for 07/09/19.    SPECIALTY MEDICATION ADHERENCE     Medication Adherence    Patient reported X missed doses in the last month: 0  Specialty Medication: Praluent  Patient is on additional specialty medications: No                Praluent 150 mg/ml: 4 days of medicine on hand         SHIPPING     Shipping address confirmed in Epic.     Delivery Scheduled: Yes, Expected medication delivery date: 07/20/19.  However, Rx request for refills was sent to the provider as there are none remaining.     Medication will be delivered via [facility] clinic to the prescription address in Epic WAM.    Jeremy Oliver   [facility] Specialty Technician

## 2019-07-06 ENCOUNTER — Ambulatory Visit: Admit: 2019-07-06 | Discharge: 2019-07-06 | Payer: MEDICARE | Attending: Adult Health | Primary: Adult Health

## 2019-07-06 DIAGNOSIS — I5042 Chronic combined systolic (congestive) and diastolic (congestive) heart failure: Principal | ICD-10-CM

## 2019-07-06 DIAGNOSIS — E7800 Pure hypercholesterolemia, unspecified: Principal | ICD-10-CM

## 2019-07-06 LAB — MAGNESIUM: Magnesium:MCnc:Pt:Ser/Plas:Qn:: 1.8

## 2019-07-06 LAB — PRO-BNP: Natriuretic peptide.B prohormone N-Terminal:MCnc:Pt:Ser/Plas:Qn:: 5610 — ABNORMAL HIGH

## 2019-07-06 MED ORDER — AMIODARONE 200 MG TABLET
ORAL_TABLET | ORAL | 3 refills | 0 days | Status: CP
Start: 2019-07-06 — End: 2020-07-05
  Filled 2019-08-31: qty 84, 84d supply, fill #0

## 2019-07-06 NOTE — Unmapped (Signed)
[facility] IV [facility]uresis Clinic Note    Primary Cardiologist/Cardiology Provider(s):  Dr. Hoy Finlay  PCP:  Kurtis Bushman, MD    Last Cardiology Clinic Visit:  06/29/2019  Last IV Diuresis Visit: 07/04/2019    Reason for Visit:  Jeremy Oliver is a 49 y.o. male with a history of combined systolic and diastolic heart failure, who is being seen today in the [facility] IV Diuresis Clinic for an urgent visit for volume status optimization including IV diuresis.    Assessment/Plan:  1. Acute on Chronic combined systolic and diastolic heart failure, acute on chronic kidney disease  - Volume status today is improving. 388 --> 379  - NYHA Class II-III  - metolazone 2.5mg /IV Lasix 160 mg was administered during clinic with good response  - Labs were obtained: Cr 5.1, K 5.1  - his living situation is likely contributing to his inability to be compliant with diet and weight monitoring   - continue Bumex 5 mg twice daily; will likely plan for 1-2x weekly metolazone. His dietary noncompliance is the major issue for him in terms of fluid retention; he actually responds very well to PO Bumex.     Response to IV diuresis:         Medications Heart rate Blood pressure Weight (lbs)   Pre  69 135/53 379   Hour 0 IV Lasix 160 mg      Hour 2       Post      378.9       Output:  I/O       04/05 0701 - 04/06 0700 04/06 0701 - 04/07 0700 04/07 0701 - 04/08 0700    I.V. (mL/kg)   26 (0.2)    Total Intake   26    Urine (mL/kg/hr)   500    Total Output(mL/kg)   500 (2.9)    Net   -474               Return to clinic:    Return in about 2 days (around 07/08/2019) for Diuresis Clinic.    Future Appointments   Date Time Provider Department Center   07/14/2019  3:00 PM Marlaine Hind, MD [facility]   07/19/2019  1:00 PM Diane R Dolan-Soto, LCSW [facility]   07/20/2019 11:00 AM Anell Barr, NP [facility]   07/26/2019  8:15 AM [place] [facility]IT[facility]   08/02/2019 12:00 PM Pallav Carlean Purl, MD [facility]   08/03/2019  8:20 AM Roma Schanz, MD [facility]   08/03/2019 10:00 AM Mila Merry, MD [facility]   01/16/2020  8:30 AM Monica Martinez, MD [facility]       History of Present Illness:  Jeremy Oliver is a 49 y.o. male with a history of combined systolic and diastolic heart failure who presents today for IV diuresis. He also has a history of significant CAD. Patient last seen in clinic on 06/29/2019. Since that time, he has gained the 4 pounds he lost last week during IV diuresis and gained an additional 2 pounds.  He was seen in the IV diuresis clinic on 06/29/2019 and was given furosemide 160 mg IV x2.  He had a good response despite his chronic kidney disease.  He had an improvement in symptoms.      He was seen in follow-up 07/04/19. He noted more orthopnea over the past 2 nights  despite being on BiPAP.  He was seen by Dr. Cherly Hensen in nephrology that morning and was referred back to IV diuresis.  He had generally not been weighing at home and endorsed shortness of breath with walking short distances.  He was able to walk from his car in the parking deck to the clinic lobby but needed a wheelchair to get the rest of the way into the clinic.  He had lower extremity edema to his knee.  He was taking Bumex 5 mg twice daily. Over the weekend he ate a slice of pizza that was supreme as well as have a salad with ranch dressing.  He is drinking several sodas.  He did not start hydralazine as recommended last week. He had good rsponse to IV Lasix 160mg  x1.     Today he presents for follow-up. His weight is down. He says he feels pretty good - was able to walk into the building today. He is urinating a lot with the Bumex.  He was instructed to take metolazone 2.5mg  x1 this morning.     Past Medical History:   Diagnosis Date   ??? Angina at rest (CMS-[name])    ??? Arthritis    ??? Asthma    ??? Atrial fibrillation and flutter (CMS-[name]) 06/08/14   ??? BMI 40.0-44.9, adult (CMS-[name]) 10/29/2015   ??? Chronic anticoagulation 02/17/2018   ??? Chronic kidney disease, stage 3    ??? Chronic pain syndrome 04/11/2014    ~Use daily lyrica as recommended ~Tramadol as needed for pain ~Fluoxetine daily as recommended ~Daily exercise ~Eat a healthy diet ~Good control of your blood sugars can help    ??? Coronary artery disease    ??? Depression    ??? Diabetes mellitus (CMS-[name])    ??? Eye trauma 1998    glass in both eyes and removed   ??? GERD (gastroesophageal reflux disease)    ??? Gout    ??? Homeless    ??? Hyperlipidemia    ??? Hypertension    ??? Illiteracy and low-level literacy    ??? Microscopic hematuria    ??? Migraine    ??? Nephrolithiasis    ??? 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 Nonalcoholic fatty liver disease    ??? NSTEMI (non-ST elevated myocardial infarction) (CMS-[name]) 01/13/2017   ??? Obesity    ??? Obstructive sleep apnea 2009   ??? Panic attacks    ??? 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 Seizure-like activity (CMS-[name])     ~Monitor for symptoms and report them to your primary care provider if they occur    ??? Systolic CHF, chronic (CMS-[name])     EF 40-45% 2017   ??? TIA (transient ischemic attack)      Past Surgical History:   Procedure Laterality Date   ??? CARDIAC CATHETERIZATION  03/08/2007   ??? CORONARY STENT PLACEMENT     ??? CYSTOURETHROSCOPY  12/31/2009     Microscopic hematuria   ??? KNEE SURGERY Right 09/23/2006    arthroscopic knee surgery medial and lateral meniscus tears as well as crystal disease   ???  PR CATH PLACE/CORON ANGIO, IMG SUPER/INTERP,R&L HRT CATH, L HRT VENTRIC N/A 02/13/2017    Procedure: Left/Right Heart Catheterization;  Surgeon: Marlaine Hind, MD;  Location: [facility] CATH;  Service: Cardiology   ??? PR CATH PLACE/CORON ANGIO, IMG SUPER/INTERP,W LEFT HEART VENTRICULOGRAPHY N/A 03/17/2017    Procedure: Left Heart Catheterization W Intervention;  Surgeon: Marlaine Hind, MD;  [facility] CATH;  Service: Cardiology   ??? PR CATH PLACE/CORON ANGIO, IMG SUPER/INTERP,W LEFT HEART VENTRICULOGRAPHY N/A 05/12/2018    Procedure: CATH LEFT HEART CATHETERIZATION W INTERVENTION;  Surgeon: Dorathy Kinsman, MD;  Location: [facility] CATH;  Service: Cardiology   ??? PR CATH PLACE/CORON ANGIO, IMG SUPER/INTERP,W LEFT HEART VENTRICULOGRAPHY N/A 10/22/2018    Procedure: CATH LEFT HEART CATHETERIZATION W INTERVENTION;  Surgeon: Marlaine Hind, MD;  Location: [facility] CATH;  Service: Cardiology   ??? PR ECMO/ECLS INITIATION VENO-ARTERIAL N/A 03/19/2017    Procedure: ECMO/ECLS; INITIATION, VENO-ARTERIAL;  Surgeon: Lennie Odor, MD;  Location: [facility];  Service: Cardiac Surgery   ??? PR ECMO/ECLS RMVL PRPH CANNULA OPEN 6 YRS & OLDER Left 03/25/2017    Procedure: ECMO / ECLS PROVIDED BY PHYSICIAN; REMOVAL OF PERIPHERAL (ARTERIAL AND/OR VENOUS) CANNULA(E), OPEN, 6 YEARS AND OLDER;  Surgeon: Alonna Buckler Ikonomidis, MD;  Location: MAIN OR [facility];  Service: Cardiac Surgery   ??? PR EPHYS EVAL W/ ABLATION SUPRAVENT ARRHYTHMIA N/A 07/19/2015    Catheter ablation of cavotricuspid isthmus for atrial flutter [facility])   ??? PR INSER HART PACER XVENOUS ATRIAL N/A 05/30/2015    [employer] dual-chamber pacemaker implant (E.Chung)   ??? PR INSERT/PLACE FLOW DIRECT CATH N/A 03/17/2017    Procedure: Insert Leave In [place];  Surgeon: Marlaine Hind, MD;  Location: [facility] CATH;  Service: Cardiology   ??? PR INSJ/RPLCMT PERM DFB W/TRNSVNS LDS 1/DUAL CHMBR N/A 05/04/2017    Procedure: ICD Implant System (Single/Dual);  Surgeon: Eldred Manges, MD;  Location: [facility] CATH;  Service: Cardiology   ??? PR NEGATIVE PRESSURE WOUND THERAPY DME >50 SQ CM Left 03/25/2017    Procedure: Neg Press Wound Tx (Vac Assist) Incl Topicals, Per Session, Tsa Greater Than/= 50 Cm Squared;  Surgeon: Alonna Buckler Ikonomidis, MD;  Location: MAIN OR [facility];  Service: Cardiac Surgery   ??? PR PRQ TRLUML CORONARY STENT W/ANGIO ONE ART/BRNCH N/A 03/12/2018    Procedure: Percutaneous Coronary Intervention;  Surgeon: Marlaine Hind, MD;  Location: [facility] CATH;  Service: Cardiology   ??? PR REBL VES DIRECT,LOW EXTREM Left 03/19/2017    Procedure: Repr Bld Vessel Direct; Lower Extrem;  Surgeon: Boykin Reaper, MD;  Location: MAIN OR [facility];  Service: Vascular   ??? PR REMV ART CLOT ILIAC-POP,LEG INCIS Left 03/25/2017    Procedure: EMBOLECTOMY OR THROMBECTOMY, WITH OR WITHOUT CATHETER; FEMOROPOPLITEAL, AORTOILIAC ARTERY, BY LEG INCISION;  Surgeon: Alonna Buckler Ikonomidis, MD;  Location: MAIN OR [facility];  Service: Cardiac Surgery   ??? PR UPPER GI ENDOSCOPY,BIOPSY N/A 02/28/2016    Procedure: UGI ENDOSCOPY; WITH BIOPSY, SINGLE OR MULTIPLE;  Surgeon: Liane Comber, MD;  Location: [facility];  Service: Gastroenterology   ??? PR UPPER GI ENDOSCOPY,DIAGNOSIS N/A 05/07/2016    Procedure: UGI ENDO, INCLUDE ESOPHAGUS, STOMACH, & DUODENUM &/OR JEJUNUM; DX W/WO COLLECTION SPECIMN, BY BRUSH OR WASH;  Surgeon: Modena Nunnery, MD;  Location: [facility];  Service: Gastroenterology       Current Outpatient Medications   Medication Sig Dispense Refill   ??? acetaminophen (TYLENOL) 500 MG tablet TAKE 2 TABLETS (1 000MG )  BY MOUTH EVERY 8 HOURS AS NEEDED FOR PAIN 180 each 3   ??? albuterol HFA 90 mcg/actuation inhaler Inhale 2 puffs into the lungs every 6 (six) hours as needed for wheezing or shortness of breath. 18 g 2   ??? alirocumab (PRALUENT) 150 mg/mL subcutaneous injection Inject the contents of 1 pen (150 mg total) under the skin every fourteen (14) days. 6 mL 3   ??? allopurinoL (ZYLOPRIM) 100 MG tablet Take 2 tablets (200 mg total) by mouth daily. 60 tablet 5   ??? amLODIPine (NORVASC) 10 MG tablet Take 1 tablet (10 mg total) by mouth daily. 90 tablet 3   ??? atorvastatin (LIPITOR) 80 MG tablet Take 1 tablet (80 mg total) by mouth daily. 90 tablet 3   ??? bumetanide (BUMEX) 2 MG tablet Take 2.5 tablets (5 mg total) by mouth two (2) times a day. 450 tablet 3   ??? carvediloL (COREG) 25 MG tablet Take 1.5 tablets (37.5 mg total) by mouth Two (2) times a day. 270 tablet 3   ??? cholecalciferol, vitamin D3, 5,000 unit capsule Take 1 capsule (5,000 Units total) by mouth daily. 100 capsule 0   ??? diclofenac sodium (VOLTAREN) 1 % gel Apply 2 grams topically Three (3) times a day. 180 g 11   ??? ezetimibe (ZETIA) 10 mg tablet TAKE 1 TABLET BY MOUTH ONCE DAILY 90 each 3   ??? FLUoxetine (PROZAC) 40 MG capsule Take 2 capsules (80 mg total) by mouth daily. 180 capsule 3   ??? gabapentin (NEURONTIN) 300 MG capsule Take 1 capsule (300 mg total) by mouth two (2) times a day. 180 capsule 3   ??? insulin ASPART (NOVOLOG FLEXPEN U-100 INSULIN) 100 unit/mL (3 mL) injection pen Inject 20 units under the skin three (3) times a day with a meal. 15 mL 11   ??? insulin detemir U-100 (LEVEMIR) 100 unit/mL (3 mL) injection pen Inject 45 Units total under the skin nightly. 15 mL 3   ??? isosorbide mononitrate (IMDUR) 30 MG 24 hr tablet Take 4 tablets (120 mg total) by mouth daily. 120 tablet 3   ??? loperamide (IMODIUM) 2 mg capsule Take 1 capsule (2 mg total) by mouth 4 (four) times a day as needed for diarrhea. 30 capsule 0   ??? omeprazole (PRILOSEC) 40 MG capsule Take 1 capsule (40 mg total) by mouth Two (2) times a day (30 minutes before a meal). 180 capsule 3   ??? ondansetron (ZOFRAN) 4 MG tablet Take 1 tablet (4 mg total) by mouth every eight (8) hours as needed for nausea for up to 4 days. 10 tablet 0   ??? ondansetron (ZOFRAN) 4 MG tablet Take 1 tablet (4 mg total) by mouth every eight (8) hours as needed for nausea. 30 tablet 0   ??? rivaroxaban (XARELTO) 15 mg Tab Take 1 tablet (15 mg total) by mouth daily with evening meal. 90 tablet 3   ??? semaglutide 0.25 mg or 0.5 mg(2 mg/1.5 mL) PnIj Inject 0.5 mg under the skin every seven (7) days. 4.5 mL 3   ??? sevelamer (RENVELA) 800 mg tablet TAKE 2 TABLETS (1600MG ) BY MOUTH THREE TIMES DAILY WITH MEALS 540 tablet 3   ??? sodium polystyrene, SPS, with sorbitol (SPS, WITH SORBITOL,) 15-20 gram/60 mL Susp Take 60 mL (15 g) by mouth once a week. 473 mL 3   ??? ticagrelor (BRILINTA) 90 mg Tab TAKE 1 TABLET BY MOUTH TWICE DAILY 180 tablet 3   ??? amiodarone (PACERONE) 200 MG tablet  TAKE 1 TABLET BY MOUTH ONCE DAILY 90 tablet 3   ??? blood sugar diagnostic Strp Disp to match current ins approved meter. Use to test blood sugar four times daily. E11.22, Z79.4 400 each 3   ??? blood-glucose meter kit Disp. blood glucose meter kit preferred by patient's insurance. Dx: Diabetes, E11.9 1 each 11   ??? insulin syringe-needle U-100 0.3 mL 31 gauge x 5/16 Syrg Use to inject insulin twice daily with meals 60 each 0   ??? lancets Misc Disp. Ins preferred lancets. Use to test blood sugar four times daily. E11.22, Z79.4 400 each 3   ??? miscellaneous medical supply (BLOOD PRESSURE CUFF) Misc Measure BP once a day or if any symptoms 1 each 0   ??? nitroglycerin (NITROSTAT) 0.4 MG SL tablet Place 1 tablet (0.4 mg total) under the tongue every five (5) minutes as needed for chest pain. (Patient not taking: Reported on 07/06/2019) 100 each 3   ??? UNIFINE PENTIPS 31 gauge x 5/16 (8 mm) Ndle USE WITH INSULIN AND VICTOZA 5 TIMES DAILY 400 each 3     No current facility-administered medications for this visit.      Allergies   Allergen Reactions   ??? Losartan      Hyperkalemia (K>6)   ??? Opioids - Morphine Analogues Anxiety       Objective:       Physical Exam  BP 135/53  - Pulse 69  - Temp 36 ??C (96.8 ??F)  - Resp 20  - Ht 188 cm (6' 2)  - Wt (!) 171.9 kg (378 lb 14.4 oz) Comment: post lasix 160 mg iv - SpO2 97%  - BMI 48.65 kg/m??    Wt Readings from Last 12 Encounters:   07/06/19 (!) 171.9 kg (378 lb 14.4 oz)   07/04/19 (!) 174.1 kg (383 lb 12.8 oz)   07/04/19 (!) 176.6 kg (389 lb 4.8 oz)   06/29/19 (!) 172.8 kg (380 lb 14.4 oz)   06/29/19 (!) 174.5 kg (384 lb 9.6 oz)   06/27/19 (!) 173.7 kg (383 lb)   06/27/19 (!) 174.8 kg (385 lb 6.4 oz)   05/10/19 (!) 162.1 kg (357 lb 6.4 oz)   04/14/19 (!) 159 kg (350 lb 9.6 oz)   04/12/19 (!) 158.3 kg (348 lb 14.4 oz)   04/07/19 (!) 157 kg (346 lb 3.2 oz)   02/02/19 (!) 162.8 kg (358 lb 14.4 oz)       General:  Patient is chronically ill-appearing in no acute distress   Eyes:  Intact, sclerae anicteric.   Ears, nose, mouth: Benign   Respiratory:   Normal respiratory effort. Clear to auscultation bilaterally.  There are no wheezes.   Cardiovascular:  JVP not seen above the clavicle with HOB at 90 degrees.  Rate and rhythm are regular.  There is no lifts or heaves.  Normal S1, S2. There is no murmur, gallops or rubs, Radial and pedal pulses are 1-2+, bilaterally.   There is 1- 2+ pitting pedal/pretibial edema, bilaterally, improved from prior.   Gastrointestinal:   Soft, non-tender, with audible bowel sounds. Abdomen nondistended.  Liver is nonpalpable.   Musculoskeletal: No joint swelling   Skin: Warm, well perfused.   Neurologic: Appropriate mood and affect. Alert and oriented to person, place, and time. No gross motor or sensory deficits evident.     Recent Labs:  Office Visit on 07/06/2019   Component Date Value Ref Range Status   ??? Magnesium 07/06/2019 1.8  1.6 -  2.6 mg/dL Final   ??? Sodium, POCT 07/06/2019 133* 135 - 145 mmol/L Final   ??? Potassium, POCT 07/06/2019 5.1* 3.5 - 5.0 mmol/L Final   ??? Chloride, POCT 07/06/2019 99  98 - 107 mmol/L Final   ??? CO2, POCT 07/06/2019 23  22 - 30 mmol/L Final   ??? BUN, POCT 07/06/2019 44* 7 - 21 mg/dL Final   ??? Creatinine, POCT 07/06/2019 5.1* 0.7 - 1.3 mg/dL Final   ??? Glucose, POCT 07/06/2019 328* 70 - 179 mg/dL Final   ??? Calcium, POCT 07/06/2019 8.3* 8.5 - 10.2 mg/dL Final   Office Visit on 07/04/2019   Component Date Value Ref Range Status   ??? PRO-BNP 07/04/2019 5,180.0* 0.0 - 138.0 pg/mL Final   ??? Magnesium 07/04/2019 1.9  1.6 - 2.6 mg/dL Final   ??? Sodium, POCT 07/04/2019 138  135 - 145 mmol/L Final   ??? Potassium, POCT 07/04/2019 5.3* 3.5 - 5.0 mmol/L Final   ??? Chloride, POCT 07/04/2019 102  98 - 107 mmol/L Final   ??? CO2, POCT 07/04/2019 23  22 - 30 mmol/L Final   ??? BUN, POCT 07/04/2019 44* 7 - 21 mg/dL Final   ??? Creatinine, POCT 07/04/2019 5.0* 0.7 - 1.3 mg/dL Final   ??? Glucose, POCT 07/04/2019 306* 70 - 179 mg/dL Final   ??? Calcium, POCT 07/04/2019 8.0* 8.5 - 10.2 mg/dL Final       Lab Results   Component Value Date    PRO-BNP 5,180.0 (H) 07/04/2019    PRO-BNP 3,960.0 (H) 06/29/2019    PRO-BNP 4,671.0 (H) 06/27/2019    PRO-BNP 4,620.0 (H) 04/14/2019    PRO-BNP 2,430.0 (H) 02/02/2019    PRO-BNP 231 (H) 06/08/2014    Creatinine, POCT 5.1 (H) 07/06/2019    Creatinine, POCT 5.0 (H) 07/04/2019    Creatinine, POCT 5.1 (H) 06/29/2019    Creatinine, POCT 4.4 (H) 02/02/2019    Creatinine, POCT 3.9 (H) 01/26/2019    Creatinine 5.06 (H) 06/27/2019    Creatinine 4.70 (H) 04/14/2019    Creatinine 4.92 (H) 04/01/2019    BUN 41 (H) 06/27/2019    BUN 38 (H) 04/14/2019    BUN 32 (H) 04/01/2019    BUN, POCT 44 (H) 07/06/2019    BUN, POCT 44 (H) 07/04/2019    BUN, POCT 42 (H) 06/29/2019    BUN, POCT 27 (H) 02/02/2019    BUN, POCT 27 (H) 01/26/2019    Magnesium 1.8 07/06/2019    Magnesium 1.9 07/04/2019    Magnesium 1.9 06/29/2019    Magnesium 1.8 06/27/2019    Magnesium 1.9 02/02/2019    Magnesium 2.0 06/09/2014    Magnesium 1.7 05/12/2011    Magnesium 1.8 01/09/2011       Metric Tracker:  Did today's visit result in ED visit? No  Did today's visit result in hospital admission? No  Did today's visit result in referral to cardiology? n/a  Today's visit was a referral from Nephrology

## 2019-07-06 NOTE — Unmapped (Signed)
Addended by: Thurnell Lose B on: 07/06/2019 12:36 PM     Modules accepted: Orders

## 2019-07-06 NOTE — Unmapped (Signed)
IV Diuresis Clinic Discharge Instructions:    MEDICATIONS:  NO medication changes today.    Call if you have questions about your medications.    IMPORTANT INSTRUCTIONS:  - Weigh yourself daily in the morning; write your weight down and bring it with you next time   - Limit your fluid intake to 2 Liters (half-gallon) per day    - Limit your salt intake to 2-3 grams (2000-3000 mg) per day  - If you have a morning diuresis appointment, don't take your morning diuretic  - If you have an afternoon diuresis appointment, take your morning diuretic    LABS:  Your labs looked good    NEXT APPOINTMENT:  Return to clinic in 2 days with IV Diuresis - Friday at 8am.    The Cardiology Clinic has moved. Our new address is [address], [place], [place] [address], 2nd floor.     My office number (c/o De Burrs RN) is [phone] if you need further assistance, or need to schedule with our IV Diuresis clinic in the future.    If you need to reschedule future appointments, please call [phone] or [phone]. After office hours, if you have urgent questions/problems, contact the on-call cardiologist through the hospital operator: [phone].

## 2019-07-06 NOTE — Unmapped (Signed)
While in diuresis, wife states patient's Praluent is out of refills.  Refills sent to [facility].

## 2019-07-07 DIAGNOSIS — N184 Chronic kidney disease, stage 4 (severe): Principal | ICD-10-CM

## 2019-07-07 DIAGNOSIS — E1122 Type 2 diabetes mellitus with diabetic chronic kidney disease: Principal | ICD-10-CM

## 2019-07-07 DIAGNOSIS — I10 Essential (primary) hypertension: Principal | ICD-10-CM

## 2019-07-07 DIAGNOSIS — Z794 Long term (current) use of insulin: Principal | ICD-10-CM

## 2019-07-07 NOTE — Unmapped (Signed)
[facility] Note    Primary Cardiologist/Cardiology Provider(s):  Dr. Hoy Finlay  PCP:  Kurtis Bushman, MD    Last Cardiology Clinic Visit:  06/29/2019  Last IV Diuresis Visit: 07/06/2019    Reason for Visit:  Jeremy Oliver is a 49 y.o. male with a history of combined systolic and diastolic heart failure, who is being seen today in the [facility] IV Diuresis Clinic for an urgent visit for volume status optimization including IV diuresis.    Assessment/Plan:  1. Acute on Chronic combined systolic and diastolic heart failure, acute on chronic kidney disease  - Volume status today is stable. 388 --> 379 --> 380.5  - NYHA Class II-III  - IV Lasix  was not administered during clinic given stability of volume status, worsening Cr  - Labs were obtained: Cr 5.9, K 5.6  - his living situation is likely contributing to his inability to be compliant with diet and weight monitoring; has plans to move into housing next week   - continue Bumex 5 mg twice daily. His dietary noncompliance is the major issue for him in terms of fluid retention; he actually responds very well to PO Bumex.   - Given worsening Cr, will not repeat metolazone over the weekend. Recheck labs Monday at [facility] and will discuss w/ his nephrologist     Response to IV diuresis:         Medications Heart rate Blood pressure Weight (lbs)   Pre  72 133/67 380.5   Hour 0 IV Lasix 0 mg      Hour 2       Post         Output:  I/O     None        Return to clinic:    Labs Monday  Dr Andrey Farmer as scheduled 4/15    Future Appointments   Date Time Provider Department Center   07/14/2019  3:00 PM Marlaine Hind, MD [facility]   07/19/2019  1:00 PM Diane R Dolan-Soto, LCSW [facility]   07/20/2019 11:00 AM Anell Barr, NP [facility]   07/26/2019  8:15 AM [facility] EP REMOTE MONITORING [facility]   08/02/2019 12:00 PM Pallav Carlean Purl, MD NEUR [facility]   08/03/2019  8:20 AM Roma Schanz, MD [facility]   08/03/2019  9:00 AM Mila Merry, MD [facility]   01/16/2020  8:30 AM Monica Martinez, MD [facility]       History of Present Illness:  Jeremy Route Jeremy Oliver is a 50 y.o. male with a history of combined systolic and diastolic heart failure who presents today for IV diuresis. He also has a history of significant CAD. Patient last seen in clinic on 06/29/2019. Since that time, he has gained the 4 pounds he lost last week during IV diuresis and gained an additional 2 pounds.  He was seen in the IV diuresis clinic on 06/29/2019 and was given furosemide 160 mg IV x2.  He had a good response despite his chronic kidney disease.  He had an improvement in symptoms.      He was seen in follow-up 07/04/19. He noted more orthopnea over the past 2 nights despite being on BiPAP.  He was seen by Dr. Cherly Hensen in nephrology that morning and was referred back to IV diuresis.  He had generally not been weighing at home and endorsed shortness of breath with walking short  distances.  He was able to walk from his car in the parking deck to the clinic lobby but needed a wheelchair to get the rest of the way into the clinic.  He had lower extremity edema to his knee.  He was taking Bumex 5 mg twice daily. Over the weekend he ate a slice of pizza that was supreme as well as have a salad with ranch dressing.  He is drinking several sodas.  He did not start hydralazine as recommended last week. He had good rsponse to IV Lasix 160mg  x1.     He was last seen 4/7 for follow-up. His weight was down. He said he felt pretty good - was able to walk into the building. He was urinating a lot with the Bumex.  He was instructed to take metolazone 2.5mg  x1 that morning.     Today, he feels about the same as he did the other day. His breathing is fine; he was able to walk into clinic today without much difficulty. He and his wife found housing, put a deposit down and will be moving in next weekend.     Past Medical History:   Diagnosis Date   ??? Angina at rest (CMS-[name])    ??? Arthritis    ??? Asthma    ??? Atrial fibrillation and flutter (CMS-[name]) 06/08/14   ??? BMI 40.0-44.9, adult (CMS-[name]) 10/29/2015   ??? Chronic anticoagulation 02/17/2018   ??? Chronic kidney disease, stage 3    ??? Chronic pain syndrome 04/11/2014    ~Use daily lyrica as recommended ~Tramadol as needed for pain ~Fluoxetine daily as recommended ~Daily exercise ~Eat a healthy diet ~Good control of your blood sugars can help    ??? Coronary artery disease    ??? Depression    ??? Diabetes mellitus (CMS-[name])    ??? Eye trauma 1998    glass in both eyes and removed   ??? GERD (gastroesophageal reflux disease)    ??? Gout    ??? Homeless    ??? Hyperlipidemia    ??? Hypertension    ??? Illiteracy and low-level literacy    ??? Microscopic hematuria    ??? Migraine    ??? Nephrolithiasis    ??? 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 Nonalcoholic fatty liver disease    ??? NSTEMI (non-ST elevated myocardial infarction) (CMS-[name]) 01/13/2017   ??? Obesity    ??? Obstructive sleep apnea 2009   ??? Panic attacks    ??? 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 Seizure-like activity (CMS-[name])     ~Monitor for symptoms and report them to your primary care provider if they occur    ??? Systolic CHF, chronic (CMS-[name])     EF 40-45% 2017   ??? TIA (transient ischemic attack)      Past Surgical History:   Procedure Laterality Date   ??? CARDIAC CATHETERIZATION  03/08/2007   ??? CORONARY STENT PLACEMENT     ??? CYSTOURETHROSCOPY  12/31/2009     Microscopic hematuria   ??? KNEE SURGERY Right 09/23/2006 arthroscopic knee surgery medial and lateral meniscus tears  as well as crystal disease   ??? PR CATH PLACE/CORON ANGIO, IMG SUPER/INTERP,R&L HRT CATH, L HRT VENTRIC N/A 02/13/2017    Procedure: Left/Right Heart Catheterization;  Surgeon: Marlaine Hind, MD;  Location: [facility] CATH;  Service: Cardiology   ??? PR CATH PLACE/CORON ANGIO, IMG SUPER/INTERP,W LEFT HEART VENTRICULOGRAPHY N/A 03/17/2017    Procedure: Left Heart Catheterization W Intervention;  Surgeon: Marlaine Hind, MD;  Location: [facility] CATH;  Service: Cardiology   ??? PR CATH PLACE/CORON ANGIO, IMG SUPER/INTERP,W LEFT HEART VENTRICULOGRAPHY N/A 05/12/2018    Procedure: CATH LEFT HEART CATHETERIZATION W INTERVENTION;  Surgeon: Dorathy Kinsman, MD;  Location: [facility] CATH;  Service: Cardiology   ??? PR CATH PLACE/CORON ANGIO, IMG SUPER/INTERP,W LEFT HEART VENTRICULOGRAPHY N/A 10/22/2018    Procedure: CATH LEFT HEART CATHETERIZATION W INTERVENTION;  Surgeon: Marlaine Hind, MD;  Location: [facility] CATH;  Service: Cardiology   ??? PR ECMO/ECLS INITIATION VENO-ARTERIAL N/A 03/19/2017    Procedure: ECMO/ECLS; INITIATION, VENO-ARTERIAL;  Surgeon: Lennie Odor, MD;  Location: MAIN OR [facility];  Service: Cardiac Surgery   ??? PR ECMO/ECLS RMVL PRPH CANNULA OPEN 6 YRS & OLDER Left 03/25/2017    Procedure: ECMO / ECLS PROVIDED BY PHYSICIAN; REMOVAL OF PERIPHERAL (ARTERIAL AND/OR VENOUS) CANNULA(E), OPEN, 6 YEARS AND OLDER;  Surgeon: Alonna Buckler Ikonomidis, MD;  Location: MAIN OR [facility];  Service: Cardiac Surgery   ??? PR EPHYS EVAL W/ ABLATION SUPRAVENT ARRHYTHMIA N/A 07/19/2015    Catheter ablation of cavotricuspid isthmus for atrial flutter [facility])   ??? PR INSER HART PACER XVENOUS ATRIAL N/A 05/30/2015    [employer] dual-chamber pacemaker implant (E.Chung)   ??? PR INSERT/PLACE FLOW DIRECT CATH N/A 03/17/2017    Procedure: Insert Leave In [place];  Surgeon: Marlaine Hind, MD;  Location: [facility] CATH;  Service: Cardiology   ??? PR INSJ/RPLCMT PERM DFB W/TRNSVNS LDS 1/DUAL CHMBR N/A 05/04/2017    Procedure: ICD Implant System (Single/Dual);  Surgeon: Eldred Manges, MD;  Location: [facility] CATH;  Service: Cardiology   ??? PR NEGATIVE PRESSURE WOUND THERAPY DME >50 SQ CM Left 03/25/2017    Procedure: Neg Press Wound Tx (Vac Assist) Incl Topicals, Per Session, Tsa Greater Than/= 50 Cm Squared;  Surgeon: Alonna Buckler Ikonomidis, MD;  Location: MAIN OR [facility];  Service: Cardiac Surgery   ??? PR PRQ TRLUML CORONARY STENT W/ANGIO ONE ART/BRNCH N/A 03/12/2018    Procedure: Percutaneous Coronary Intervention;  Surgeon: Marlaine Hind, MD;  Location: [facility] CATH;  Service: Cardiology   ??? PR REBL VES DIRECT,LOW EXTREM Left 03/19/2017    Procedure: Repr Bld Vessel Direct; Lower Extrem;  Surgeon: Boykin Reaper, MD;  Location: MAIN OR [facility];  Service: Vascular   ??? PR REMV ART CLOT ILIAC-POP,LEG INCIS Left 03/25/2017    Procedure: EMBOLECTOMY OR THROMBECTOMY, WITH OR WITHOUT CATHETER; FEMOROPOPLITEAL, AORTOILIAC ARTERY, BY LEG INCISION;  Surgeon: Alonna Buckler Ikonomidis, MD;  Location: MAIN OR [facility];  Service: Cardiac Surgery   ??? PR UPPER GI ENDOSCOPY,BIOPSY N/A 02/28/2016    Procedure: UGI ENDOSCOPY; WITH BIOPSY, SINGLE OR MULTIPLE;  Surgeon: Liane Comber, MD;  Location: GI [facility]URES [facility];  Service: Gastroenterology   ??? PR UPPER GI ENDOSCOPY,DIAGNOSIS N/A 05/07/2016    Procedure: UGI ENDO, INCLUDE ESOPHAGUS, STOMACH, & DUODENUM &/OR JEJUNUM; DX W/WO COLLECTION SPECIMN, BY BRUSH OR WASH;  Surgeon: Modena Nunnery, MD;  Location: GI [facility];  Service: Gastroenterology       Current Outpatient Medications   Medication Sig Dispense Refill   ??? acetaminophen (TYLENOL)  500 MG tablet TAKE 2 TABLETS (1 000MG ) BY MOUTH EVERY 8 HOURS AS NEEDED FOR PAIN 180 each 3   ??? albuterol HFA 90 mcg/actuation inhaler Inhale 2 puffs into the lungs every 6 (six) hours as needed for wheezing or shortness of breath. 18 g 2   ??? alirocumab (PRALUENT) 150 mg/mL subcutaneous injection Inject the contents of 1 pen (150 mg total) under the skin every fourteen (14) days. 6 mL 3   ??? allopurinoL (ZYLOPRIM) 100 MG tablet Take 2 tablets (200 mg total) by mouth daily. 60 tablet 5   ??? amiodarone (PACERONE) 200 MG tablet TAKE 1 TABLET BY MOUTH ONCE DAILY 90 tablet 3   ??? amLODIPine (NORVASC) 10 MG tablet Take 1 tablet (10 mg total) by mouth daily. 90 tablet 3   ??? atorvastatin (LIPITOR) 80 MG tablet Take 1 tablet (80 mg total) by mouth daily. 90 tablet 3   ??? blood sugar diagnostic Strp Disp to match current ins approved meter. Use to test blood sugar four times daily. E11.22, Z79.4 400 each 3   ??? blood-glucose meter kit Disp. blood glucose meter kit preferred by patient's insurance. Dx: Diabetes, E11.9 1 each 11   ??? bumetanide (BUMEX) 2 MG tablet Take 2.5 tablets (5 mg total) by mouth two (2) times a day. 450 tablet 3   ??? carvediloL (COREG) 25 MG tablet Take 1.5 tablets (37.5 mg total) by mouth Two (2) times a day. 270 tablet 3   ??? cholecalciferol, vitamin D3, 5,000 unit capsule Take 1 capsule (5,000 Units total) by mouth daily. 100 capsule 0   ??? diclofenac sodium (VOLTAREN) 1 % gel Apply 2 grams topically Three (3) times a day. 180 g 11   ??? ezetimibe (ZETIA) 10 mg tablet TAKE 1 TABLET BY MOUTH ONCE DAILY 90 each 3   ??? FLUoxetine (PROZAC) 40 MG capsule Take 2 capsules (80 mg total) by mouth daily. 180 capsule 3   ??? gabapentin (NEURONTIN) 300 MG capsule Take 1 capsule (300 mg total) by mouth two (2) times a day. 180 capsule 3   ??? insulin ASPART (NOVOLOG FLEXPEN U-100 INSULIN) 100 unit/mL (3 mL) injection pen Inject 20 units under the skin three (3) times a day with a meal. 15 mL 11   ??? insulin detemir U-100 (LEVEMIR) 100 unit/mL (3 mL) injection pen Inject 45 Units total under the skin nightly. 15 mL 3   ??? insulin syringe-needle U-100 0.3 mL 31 gauge x 5/16 Syrg Use to inject insulin twice daily with meals 60 each 0   ??? isosorbide mononitrate (IMDUR) 30 MG 24 hr tablet Take 4 tablets (120 mg total) by mouth daily. 120 tablet 3   ??? lancets Misc Disp. Ins preferred lancets. Use to test blood sugar four times daily. E11.22, Z79.4 400 each 3   ??? loperamide (IMODIUM) 2 mg capsule Take 1 capsule (2 mg total) by mouth 4 (four) times a day as needed for diarrhea. 30 capsule 0   ??? miscellaneous medical supply (BLOOD PRESSURE CUFF) Misc Measure BP once a day or if any symptoms 1 each 0   ??? nitroglycerin (NITROSTAT) 0.4 MG SL tablet Place 1 tablet (0.4 mg total) under the tongue every five (5) minutes as needed for chest pain. (Patient not taking: Reported on 07/06/2019) 100 each 3   ??? omeprazole (PRILOSEC) 40 MG capsule Take 1 capsule (40 mg total) by mouth Two (2) times a day (30 minutes before a meal). 180 capsule 3   ??? ondansetron (ZOFRAN)  4 MG tablet Take 1 tablet (4 mg total) by mouth every eight (8) hours as needed for nausea for up to 4 days. 10 tablet 0   ??? ondansetron (ZOFRAN) 4 MG tablet Take 1 tablet (4 mg total) by mouth every eight (8) hours as needed for nausea. 30 tablet 0   ??? rivaroxaban (XARELTO) 15 mg Tab Take 1 tablet (15 mg total) by mouth daily with evening meal. 90 tablet 3   ??? semaglutide 0.25 mg or 0.5 mg(2 mg/1.5 mL) PnIj Inject 0.5 mg under the skin every seven (7) days. 4.5 mL 3   ??? sevelamer (RENVELA) 800 mg tablet TAKE 2 TABLETS (1600MG ) BY MOUTH THREE TIMES DAILY WITH MEALS 540 tablet 3   ??? sodium polystyrene, SPS, with sorbitol (SPS, WITH SORBITOL,) 15-20 gram/60 mL Susp Take 60 mL (15 g) by mouth once a week. 473 mL 3   ??? ticagrelor (BRILINTA) 90 mg Tab TAKE 1 TABLET BY MOUTH TWICE DAILY 180 tablet 3   ??? UNIFINE PENTIPS 31 gauge x 5/16 (8 mm) Ndle USE WITH INSULIN AND VICTOZA 5 TIMES DAILY 400 each 3     No current facility-administered medications for this visit.      Allergies   Allergen Reactions   ??? Losartan      Hyperkalemia (K>6)   ??? Opioids - Morphine Analogues Anxiety       Objective:       Physical Exam  BP 133/67 (BP Site: L Arm, BP Position: Sitting, BP Cuff Size: Large)  - Pulse 72  - Temp 35.8 ??C (96.5 ??F) (Temporal)  - Ht 188 cm (6' 2)  - Wt (!) 172.6 kg (380 lb 8 oz)  - SpO2 93%  - BMI 48.85 kg/m??    Wt Readings from Last 12 Encounters:   07/08/19 (!) 172.6 kg (380 lb 8 oz)   07/06/19 (!) 171.9 kg (378 lb 14.4 oz)   07/04/19 (!) 174.1 kg (383 lb 12.8 oz)   07/04/19 (!) 176.6 kg (389 lb 4.8 oz)   06/29/19 (!) 172.8 kg (380 lb 14.4 oz)   06/29/19 (!) 174.5 kg (384 lb 9.6 oz)   06/27/19 (!) 173.7 kg (383 lb)   06/27/19 (!) 174.8 kg (385 lb 6.4 oz)   05/10/19 (!) 162.1 kg (357 lb 6.4 oz)   04/14/19 (!) 159 kg (350 lb 9.6 oz)   04/12/19 (!) 158.3 kg (348 lb 14.4 oz)   04/07/19 (!) 157 kg (346 lb 3.2 oz)       General:  Patient is chronically ill-appearing in no acute distress   Eyes:  Intact, sclerae anicteric.   Ears, nose, mouth: Benign   Respiratory:   Normal respiratory effort. Clear to auscultation bilaterally.  There are no wheezes.   Cardiovascular:  JVP not seen above the clavicle with HOB at 90 degrees.  Rate and rhythm are regular.  There is no lifts or heaves.  Normal S1, S2. There is no murmur, gallops or rubs, Radial and pedal pulses are 1-2+, bilaterally.   There is 1- 2+ pitting pedal/pretibial edema, bilaterally, improved from prior.   Gastrointestinal:   Soft, non-tender, with audible bowel sounds. Abdomen nondistended.  Liver is nonpalpable.   Musculoskeletal: No joint swelling   Skin: Warm, well perfused.   Neurologic: Appropriate mood and affect. Alert and oriented to person, place, and time. No gross motor or sensory deficits evident.     Recent Labs:  Appointment on 07/08/2019   Component Date Value Ref Range  Status   ??? Magnesium 07/08/2019 1.8  1.6 - 2.6 mg/dL Final   ??? PRO-BNP 16/12/9602 4,050.0* 0.0 - 138.0 pg/mL Final   ??? Sodium, POCT 07/08/2019 132* 135 - 145 mmol/L Final   ??? Potassium, POCT 07/08/2019 5.6* 3.5 - 5.0 mmol/L Final   ??? Chloride, POCT 07/08/2019 102  98 - 107 mmol/L Final   ??? CO2, POCT 07/08/2019 25  22 - 30 mmol/L Final   ??? BUN, POCT 07/08/2019 51* 7 - 21 mg/dL Final   ??? Creatinine, POCT 07/08/2019 5.9* 0.7 - 1.3 mg/dL Final   ??? Glucose, POCT 07/08/2019 344* 70 - 179 mg/dL Final   ??? Calcium, POCT 07/08/2019 8.4* 8.5 - 10.2 mg/dL Final   Office Visit on 07/06/2019   Component Date Value Ref Range Status   ??? Magnesium 07/06/2019 1.8  1.6 - 2.6 mg/dL Final   ??? PRO-BNP 54/11/8117 5,610.0* 0.0 - 138.0 pg/mL Final   ??? Sodium, POCT 07/06/2019 133* 135 - 145 mmol/L Final   ??? Potassium, POCT 07/06/2019 5.1* 3.5 - 5.0 mmol/L Final   ??? Chloride, POCT 07/06/2019 99  98 - 107 mmol/L Final   ??? CO2, POCT 07/06/2019 23  22 - 30 mmol/L Final   ??? BUN, POCT 07/06/2019 44* 7 - 21 mg/dL Final   ??? Creatinine, POCT 07/06/2019 5.1* 0.7 - 1.3 mg/dL Final   ??? Glucose, POCT 07/06/2019 328* 70 - 179 mg/dL Final   ??? Calcium, POCT 07/06/2019 8.3* 8.5 - 10.2 mg/dL Final   Office Visit on 07/04/2019   Component Date Value Ref Range Status   ??? PRO-BNP 07/04/2019 5,180.0* 0.0 - 138.0 pg/mL Final   ??? Magnesium 07/04/2019 1.9  1.6 - 2.6 mg/dL Final   ??? Sodium, POCT 07/04/2019 138  135 - 145 mmol/L Final   ??? Potassium, POCT 07/04/2019 5.3* 3.5 - 5.0 mmol/L Final   ??? Chloride, POCT 07/04/2019 102  98 - 107 mmol/L Final   ??? CO2, POCT 07/04/2019 23  22 - 30 mmol/L Final   ??? BUN, POCT 07/04/2019 44* 7 - 21 mg/dL Final   ??? Creatinine, POCT 07/04/2019 5.0* 0.7 - 1.3 mg/dL Final   ??? Glucose, POCT 07/04/2019 306* 70 - 179 mg/dL Final   ??? Calcium, POCT 07/04/2019 8.0* 8.5 - 10.2 mg/dL Final       Lab Results   Component Value Date    PRO-BNP 4,050.0 (H) 07/08/2019    PRO-BNP 5,610.0 (H) 07/06/2019    PRO-BNP 5,180.0 (H) 07/04/2019    PRO-BNP 3,960.0 (H) 06/29/2019    PRO-BNP 4,671.0 (H) 06/27/2019    PRO-BNP 231 (H) 06/08/2014    Creatinine, POCT 5.9 (H) 07/08/2019    Creatinine, POCT 5.1 (H) 07/06/2019    Creatinine, POCT 5.0 (H) 07/04/2019    Creatinine, POCT 5.1 (H) 06/29/2019    Creatinine, POCT 4.4 (H) 02/02/2019    Creatinine 5.06 (H) 06/27/2019    Creatinine 4.70 (H) 04/14/2019    Creatinine 4.92 (H) 04/01/2019    BUN 41 (H) 06/27/2019    BUN 38 (H) 04/14/2019    BUN 32 (H) 04/01/2019    BUN, POCT 51 (H) 07/08/2019    BUN, POCT 44 (H) 07/06/2019    BUN, POCT 44 (H) 07/04/2019    BUN, POCT 42 (H) 06/29/2019    BUN, POCT 27 (H) 02/02/2019    Magnesium 1.8 07/08/2019    Magnesium 1.8 07/06/2019    Magnesium 1.9 07/04/2019    Magnesium 1.9 06/29/2019    Magnesium 1.8 06/27/2019  Magnesium 2.0 06/09/2014    Magnesium 1.7 05/12/2011    Magnesium 1.8 01/09/2011       Metric Tracker:  Did today's visit result in ED visit? No  Did today's visit result in hospital admission? No  Did today's visit result in referral to cardiology? n/a  Today's visit was a referral from Nephrology

## 2019-07-08 ENCOUNTER — Ambulatory Visit: Admit: 2019-07-08 | Discharge: 2019-07-09 | Payer: MEDICARE | Attending: Adult Health | Primary: Adult Health

## 2019-07-08 DIAGNOSIS — I5042 Chronic combined systolic (congestive) and diastolic (congestive) heart failure: Principal | ICD-10-CM

## 2019-07-08 LAB — PRO-BNP: Natriuretic peptide.B prohormone N-Terminal:MCnc:Pt:Ser/Plas:Qn:: 4050 — ABNORMAL HIGH

## 2019-07-08 LAB — MAGNESIUM: Magnesium:MCnc:Pt:Ser/Plas:Qn:: 1.8

## 2019-07-08 MED ORDER — METOLAZONE 2.5 MG TABLET
ORAL_TABLET | ORAL | 2 refills | 28.00000 days | Status: CP
Start: 2019-07-08 — End: 2019-07-08

## 2019-07-08 NOTE — Unmapped (Signed)
Pt and wife call  CCM today, because very ecstatically happy, they finally have a home, down payment today,  Already paying rent. Both in great spirits.   Tomorrow pt coming for diuresis clinic then [place] head to beach to see grandson before the grandson and parents move to [place].  Pt does understand needs to start dialysis, CKD stage V, per [name].

## 2019-07-08 NOTE — Unmapped (Signed)
IV Diuresis Clinic Discharge Instructions:    MEDICATIONS:  NO medication changes today.  I will talk to Dr Cherly Hensen about next steps and call you  Please take your kayexelate today  Call if you have questions about your medications.    IMPORTANT INSTRUCTIONS:  - Weigh yourself daily in the morning; write your weight down and bring it with you next time   - Limit your fluid intake to 2 Liters (half-gallon) per day    - Limit your salt intake to 2-3 grams (2000-3000 mg) per day  - If you have a morning diuresis appointment, don't take your morning diuretic  - If you have an afternoon diuresis appointment, take your morning diuretic    LABS:  Your creatinine was up today; potassium was also up    NEXT APPOINTMENT:  Return to clinic in 6 days with Dr Andrey Farmer as scheduled    The Cardiology Clinic has moved. Our new address is [address] [place], [place] [address], 2nd floor.     My office number (c/o De Burrs RN) is [phone] if you need further assistance, or need to schedule with our IV Diuresis clinic in the future.    If you need to reschedule future appointments, please call [phone] or [phone]. After office hours, if you have urgent questions/problems, contact the on-call cardiologist through the hospital operator: [phone].

## 2019-07-08 NOTE — Unmapped (Signed)
I was the supervising physician in the delivery of the service. Penne Rosenstock D Gwyneth Fernandez, MD

## 2019-07-11 ENCOUNTER — Ambulatory Visit: Admit: 2019-07-11 | Discharge: 2019-07-12 | Payer: MEDICARE

## 2019-07-11 LAB — BASIC METABOLIC PANEL
ANION GAP: 13 mmol/L (ref 7–15)
BLOOD UREA NITROGEN: 55 mg/dL — ABNORMAL HIGH (ref 7–21)
BUN / CREAT RATIO: 9
CALCIUM: 8.5 mg/dL (ref 8.5–10.2)
CHLORIDE: 100 mmol/L (ref 98–107)
CO2: 24 mmol/L (ref 22.0–30.0)
CREATININE: 5.92 mg/dL — ABNORMAL HIGH (ref 0.70–1.30)
EGFR CKD-EPI AA MALE: 12 mL/min/{1.73_m2} — ABNORMAL LOW (ref >=60)
EGFR CKD-EPI NON-AA MALE: 10 mL/min/{1.73_m2} — ABNORMAL LOW (ref >=60)
GLUCOSE RANDOM: 353 mg/dL — ABNORMAL HIGH (ref 70–179)
SODIUM: 137 mmol/L (ref 135–145)

## 2019-07-11 LAB — POTASSIUM: Potassium:SCnc:Pt:Ser/Plas:Qn:: 6.3

## 2019-07-11 MED ORDER — SPS (WITH SORBITOL) 15 GRAM-20 GRAM/60 ML ORAL SUSPENSION
Freq: Every day | ORAL | 2 refills | 3.00000 days | Status: CP
Start: 2019-07-11 — End: 2019-07-14
  Filled 2019-07-11: qty 180, 3d supply, fill #0

## 2019-07-11 MED FILL — SPS (WITH SORBITOL) 15 GRAM-20 GRAM/60 ML ORAL SUSPENSION: 3 days supply | Qty: 180 | Fill #0 | Status: AC

## 2019-07-14 ENCOUNTER — Ambulatory Visit: Admit: 2019-07-14 | Discharge: 2019-07-15 | Payer: MEDICARE | Attending: Adult Health | Primary: Adult Health

## 2019-07-14 ENCOUNTER — Ambulatory Visit
Admit: 2019-07-14 | Discharge: 2019-07-15 | Payer: MEDICARE | Attending: Cardiovascular Disease | Primary: Cardiovascular Disease

## 2019-07-14 DIAGNOSIS — I509 Heart failure, unspecified: Principal | ICD-10-CM

## 2019-07-14 DIAGNOSIS — J449 Chronic obstructive pulmonary disease, unspecified: Principal | ICD-10-CM

## 2019-07-14 DIAGNOSIS — I5042 Chronic combined systolic (congestive) and diastolic (congestive) heart failure: Principal | ICD-10-CM

## 2019-07-14 DIAGNOSIS — I10 Essential (primary) hypertension: Principal | ICD-10-CM

## 2019-07-14 DIAGNOSIS — I25118 Atherosclerotic heart disease of native coronary artery with other forms of angina pectoris: Principal | ICD-10-CM

## 2019-07-14 DIAGNOSIS — I2510 Atherosclerotic heart disease of native coronary artery without angina pectoris: Principal | ICD-10-CM

## 2019-07-14 LAB — MAGNESIUM: Magnesium:MCnc:Pt:Ser/Plas:Qn:: 1.9

## 2019-07-14 LAB — PRO-BNP: Natriuretic peptide.B prohormone N-Terminal:MCnc:Pt:Ser/Plas:Qn:: 5060 — ABNORMAL HIGH

## 2019-07-14 NOTE — Unmapped (Signed)
PCP:  Kurtis Bushman, MD    Patient ID: Jeremy Oliver is a 49 y.o. male.    Assessment and Plan:   1. Coronary artery disease involving native coronary artery of native heart without angina pectoris  2. Chronic heart failure with reduced ejection fraction and diastolic dysfunction (CMS-[name])  3. Paroxysmal atrial fibrillation (CMS-[name])  4. Essential hypertension (RAF-[name])  5. CKD 5    Jeremy Oliver is doing OK today but has significant fluid overload and progressive angina.  I agree with Dr. Cherly Hensen that it would be reasonable to admit him for initiation of dialysis.  Once this is established we will need to remove volume and reassess his angina.  If he continues to have angina would likely benefit from repeat cath and possible PCI.      We discussed the importance of dietary changes etc.  He is going to try to avoid soft drinks and gatorade and limit overall fluid intake to 2L per day.  He will discuss dialysis with Dr. Cherly Hensen next week.     LDL 137 despite high dose statin.  Will need to assess for PCSK9 inhibitor once he establishes a mailing address and we can provide pharmacy assistance.     Dictated using Animal nutritionist, please excuse typos    Elpidio Anis  MD [facility]  Division of Cardiology   [place] [facility] [place] office [phone]    Subjective:      Jeremy Oliver return to clinic today.  He was recently admitted to the hospital again for acute heart failure and acute coronary syndrome and a repeat diagnostic cath showed severe stenosis of the right coronary artery.  Another stent was placed in his distal right coronary artery and he was discharged home.  He returned to [facility] with worsening renal insufficiency and was hospitalized briefly last week.     He denies any chest pain, palpitations, syncope, falls or injuries.  He does have chronic dyspnea and currently has NYHA functional class II-III symptoms.  He is limited by left leg pain as well as a result of previous nerve injury during ECMO cannulation.    Interval hx   Jeremy Oliver presents today for follow-up with his wife.  Prior to his appointment today he was seen in the diuresis clinic. He typically drinks 2 sodas and gatorade and 4 glasses of water each day.  He eats fast food regularly but does not add salt to his french fries. Each time he voids the urine it averages between 500-700 mL.        Patient Active Problem List   Diagnosis   ??? 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 Idiopathic chronic gout of left knee   ??? Hypercholesteremia   ??? Essential hypertension (RAF-[name])   ??? 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 Osteoarthritis   ??? Chronic nonalcoholic liver disease   ??? GERD (gastroesophageal reflux disease)   ??? Mild intermittent asthma without complication   ??? Chronic pain syndrome   ??? Paroxysmal atrial fibrillation (CMS-[name])   ??? OSA (obstructive sleep apnea)   ??? Tobacco use disorder   ??? Status post cardiac pacemaker procedure   ??? Diabetes mellitus with gastroparesis (CMS-[name])   ??? Cardiac pacemaker in situ   ??? Recurrent  major depressive disorder, in full remission (CMS-[name])   ??? Adjustment disorder, unspecified   ??? Hyperkalemia   ??? CKD (chronic kidney disease) stage 4, GFR 15-29 ml/min (CMS-[name])   ??? Acquired left foot drop   ??? Decreased sensation of lower extremity, left   ??? Chronic anticoagulation   ??? Type 2 diabetes mellitus with stage 4 chronic kidney disease, with long-term current use of insulin (CMS-[name])   ??? Situational stress   ??? Cough   ??? QT prolongation     Most Recent Imaging:  [facility]  10/22/18  1.   Significant 90% stenosis of distal RCA, severe proximal LCx stenosis, and diffuse severe distal LAD stenosis  2.   Elevated LVEDP = 22   3.   Successful DES to the distal RCA with the placement of a Synergy 4.0 x 20 with excellent angiographic result and TIMI 3 flow.    05/12/18  1. Coronary artery disease including diffuse moderate to severe lesions in the left coronary system, with particular emphasis on the 90% proximal to mid LAD in-stent restenosis.  2. Successful PCI to the proximal to mid LAD with the placement of a Synergy drug eluting stent with excellent angiographic result and TIMI 3 flow.  3. Subsequent impaired flow at an adjacent moderate diagonal, which was small and had pre-existing ostial disease. Recrossed and treated with balloon angioplasty with restored flow.  4. Recent stent to RPDA/RPLA bifurcation widely patent.  5. Normal left ventricular filling pressures (LVEDP = 8 mm Hg).    03/12/18  1. Coronary artery disease including??severe mid RCA, and distal RCA bifurcation stenosis.  2. Left heart cath deferred  3. Successful??PCI??to the mid RCA and RPLA/RPDA bifurcation??with the placement of a Synergy DES x??with excellent angiographic result and TIMI 3 flow.    Echo 10/19/18  ?? Dilated left ventricle - mild  ?? Moderately decreased left ventricular systolic function, ejection fraction 35%  ?? Segmental wall motion abnormality - severe posterolateral hypokinesis  ?? Mitral regurgitation - mild  ?? Dilated left atrium - moderate  ?? Aortic sclerosis  ?? Normal right ventricular systolic function  ?? Technically difficult study due to chest wall/lung interference  ?? Ultrasound enhancing agent utilized to improve endocardial border definition  ?? Diastolic dysfunction - grade I (normal filling pressures)    ECG 04/01/19  NORMAL SINUS RHYTHM  LEFT VENTRICULAR HYPERTROPHY WITH REPOLARIZATION ABNORMALITY    Current Outpatient Medications   Medication Sig Dispense Refill   ??? acetaminophen (TYLENOL) 500 MG tablet TAKE 2 TABLETS (1 000MG ) BY MOUTH EVERY 8 HOURS AS NEEDED FOR PAIN 180 each 3   ??? albuterol HFA 90 mcg/actuation inhaler Inhale 2 puffs into the lungs every 6 (six) hours as needed for wheezing or shortness of breath. 18 g 2   ??? alirocumab (PRALUENT) 150 mg/mL subcutaneous injection Inject the contents of 1 pen (150 mg total) under the skin every fourteen (14) days. 6 mL 3   ??? allopurinoL (ZYLOPRIM) 100 MG tablet Take 2 tablets (200 mg total) by mouth daily. 60 tablet 5   ??? amiodarone (PACERONE) 200 MG tablet TAKE 1 TABLET BY MOUTH ONCE DAILY 90 tablet 3   ??? amLODIPine (NORVASC) 10 MG tablet Take 1 tablet (10 mg total) by mouth daily. 90 tablet 3   ??? atorvastatin (LIPITOR) 80 MG tablet Take 1 tablet (80 mg total) by mouth daily. 90 tablet 3   ??? blood sugar diagnostic Strp Disp to match current ins approved meter. Use to test blood sugar  four times daily. E11.22, Z79.4 400 each 3   ??? bumetanide (BUMEX) 2 MG tablet Take 2.5 tablets (5 mg total) by mouth two (2) times a day. 450 tablet 3   ??? carvediloL (COREG) 25 MG tablet Take 1.5 tablets (37.5 mg total) by mouth Two (2) times a day. 270 tablet 3   ??? diclofenac sodium (VOLTAREN) 1 % gel Apply 2 grams topically Three (3) times a day. 180 g 11   ??? ezetimibe (ZETIA) 10 mg tablet TAKE 1 TABLET BY MOUTH ONCE DAILY 90 each 3   ??? FLUoxetine (PROZAC) 40 MG capsule Take 2 capsules (80 mg total) by mouth daily. 180 capsule 3   ??? gabapentin (NEURONTIN) 300 MG capsule Take 1 capsule (300 mg total) by mouth two (2) times a day. 180 capsule 3   ??? insulin detemir U-100 (LEVEMIR) 100 unit/mL (3 mL) injection pen Inject 45 Units total under the skin nightly. 15 mL 3   ??? insulin syringe-needle U-100 0.3 mL 31 gauge x 5/16 Syrg Use to inject insulin twice daily with meals 60 each 0   ??? isosorbide mononitrate (IMDUR) 30 MG 24 hr tablet Take 4 tablets (120 mg total) by mouth daily. 120 tablet 3   ??? lancets Misc Disp. Ins preferred lancets. Use to test blood sugar four times daily. E11.22, Z79.4 400 each 3   ??? omeprazole (PRILOSEC) 40 MG capsule Take 1 capsule (40 mg total) by mouth Two (2) times a day (30 minutes before a meal). 180 capsule 3   ??? rivaroxaban (XARELTO) 15 mg Tab Take 1 tablet (15 mg total) by mouth daily with evening meal. 90 tablet 3   ??? semaglutide 0.25 mg or 0.5 mg(2 mg/1.5 mL) PnIj Inject 0.5 mg under the skin every seven (7) days. 4.5 mL 3   ??? sevelamer (RENVELA) 800 mg tablet TAKE 2 TABLETS (1600MG ) BY MOUTH THREE TIMES DAILY WITH MEALS 540 tablet 3   ??? ticagrelor (BRILINTA) 90 mg Tab TAKE 1 TABLET BY MOUTH TWICE DAILY 180 tablet 3   ??? blood-glucose meter kit Disp. blood glucose meter kit preferred by patient's insurance. Dx: Diabetes, E11.9 1 each 11   ??? hydrOXYzine (ATARAX) 10 MG tablet Take 1 tablet (10 mg total) by mouth nightly as needed for anxiety. 30 tablet 0   ??? insulin ASPART (NOVOLOG FLEXPEN U-100 INSULIN) 100 unit/mL (3 mL) injection pen Inject 0.22 mL (22 Units total) under the skin Three (3) times a day with a meal. 15 mL 11   ??? loperamide (IMODIUM) 2 mg capsule Take 1 capsule (2 mg total) by mouth 4 (four) times a day as needed for diarrhea. (Patient not taking: Reported on 07/14/2019) 30 capsule 0   ??? miscellaneous medical supply (BLOOD PRESSURE CUFF) Misc Measure BP once a day or if any symptoms 1 each 0   ??? nitroglycerin (NITROSTAT) 0.4 MG SL tablet Place 1 tablet (0.4 mg total) under the tongue every five (5) minutes as needed for chest pain. (Patient not taking: Reported on 07/06/2019) 100 each 3   ??? ondansetron (ZOFRAN) 4 MG tablet Take 1 tablet (4 mg total) by mouth every eight (8) hours as needed for nausea for up to 4 days. (Patient not taking: Reported on 07/14/2019) 10 tablet 0   ??? ondansetron (ZOFRAN) 4 MG tablet Take 1 tablet (4 mg total) by mouth every eight (8) hours as needed for nausea. (Patient not taking: Reported on 07/14/2019) 30 tablet 0   ??? sodium polystyrene, SPS, with sorbitol (  SPS, WITH SORBITOL,) 15-20 gram/60 mL Susp Take 60 mL (15 g) by mouth once a week. (Patient not taking: Reported on 07/14/2019) 473 mL 3   ??? UNIFINE PENTIPS 31 gauge x 5/16 (8 mm) Ndle USE WITH INSULIN AND VICTOZA 5 TIMES DAILY 400 each 3     No current facility-administered medications for this visit. Allergies   Allergen Reactions   ??? Losartan      Hyperkalemia (K>6)   ??? Opioids - Morphine Analogues Anxiety       Review of Systems  Pertinent items are noted in HPI.      Objective:      Vitals:    07/14/19 1512   BP: 171/93   Pulse: 76   Resp: 20   Temp: 36.2 ??C (97.2 ??F)   SpO2: 98%   Body mass index is 48.61 kg/m??.    Wt Readings from Last 6 Encounters:   07/15/19 (!) 173.7 kg (383 lb)   07/14/19 (!) 171.7 kg (378 lb 9.6 oz)   07/14/19 (!) 172.4 kg (380 lb)   07/08/19 (!) 172.6 kg (380 lb 8 oz)   07/06/19 (!) 171.9 kg (378 lb 14.4 oz)   07/04/19 (!) 174.1 kg (383 lb 12.8 oz)     NAD  RRR, s1s2   CTA B   Abd benign  EXT 2+ edema  Neuro grossly normal  Psych grossly normal    Please excuse typos, dictation completed via Conservation officer, historic buildings      _____________________________________________________________  Scribe Attestation:         This document serves as a record of the services and decisions performed by Elpidio Anis, MD on 07/14/2019. It was created on his behalf by Inez Pilgrim, a trained medical scribe. The creation of this document is based on the provider's statements and observations that were conveyed to the medical scribe during the patient's encounter.     (The information in this document, created by the medical scribe for me, accurately reflects the services I personally performed and the decisions made by me. I have reviewed and approved this document for accuracy.)     Elpidio Anis  MD [facility]  Division of Cardiology   [place] [facility] office [phone]  Pager [phone]

## 2019-07-14 NOTE — Unmapped (Addendum)
IV Diuresis Clinic Discharge Instructions:    MEDICATIONS:  NO medication changes today.    Call if you have questions about your medications.    IMPORTANT INSTRUCTIONS:  - Weigh yourself daily in the morning; write your weight down and bring it with you next time   - Limit your fluid intake to 2 Liters (half-gallon) per day    - Limit your salt intake to 2-3 grams (2000-3000 mg) per day  - If you have a morning diuresis appointment, don't take your morning diuretic  - If you have an afternoon diuresis appointment, take your morning diuretic    LABS:  Cr was a bit better    NEXT APPOINTMENT:  See Dr Hulan Fess and kidney doctors as scheduled    The Cardiology Clinic has moved. Our new address is [address], [place], [place] [address], 2nd floor.     My office number (c/o De Burrs RN) is [phone] if you need further assistance, or need to schedule with our IV Diuresis clinic in the future.    If you need to reschedule future appointments, please call [phone] or [phone]. After office hours, if you have urgent questions/problems, contact the on-call cardiologist through the hospital operator: [phone].

## 2019-07-14 NOTE — Unmapped (Signed)
[facility] Note    Primary Cardiologist/Cardiology Provider(s):  Dr. Hoy Finlay  PCP:  Kurtis Bushman, MD    Last Cardiology Clinic Visit:  06/29/2019  Last IV Diuresis Visit: 07/08/2019    Reason for Visit:  Jeremy Oliver is a 49 y.o. male with a history of combined systolic and diastolic heart failure, who is being seen today in the [facility] for an urgent visit for volume status optimization including IV diuresis.    Assessment/Plan:  1. Acute on Chronic combined systolic and diastolic heart failure, acute on chronic kidney disease  - Volume status today is stable. 388 --> 379 --> 380.5 --> 380  - NYHA Class II-III  - IV Lasix 160mg  was not administered during clinic with good response  - Labs were obtained: Cr 5.6, K 5.5  - his living situation is likely contributing to his inability to be compliant with diet and weight monitoring; has plans to move into housing this coming weekend  - continue Bumex 5 mg twice daily. Continue to work on reducing sodium.     Response to IV diuresis:         Medications Heart rate Blood pressure Weight (lbs)   Pre  72 133/67 380.5   Hour 0 IV Lasix 0 mg      Hour 2       Post    378.6     Output:  I/O       04/13 0701 - 04/14 0700 04/14 0701 - 04/15 0700 04/15 0701 - 04/16 0700    I.V. (mL/kg)   36 (0.2)    Total Intake   36    Urine (mL/kg/hr)   750    Total Output(mL/kg)   750 (4.4)    Net   -714               Return in about 5 days (around 07/19/2019).      Future Appointments   Date Time Provider Department Center   07/15/2019 10:00 AM Roma Schanz, MD [facility]   07/19/2019 11:00 AM DIURESIS HF NP Hamel [facility]   07/19/2019  1:00 PM Diane R Dolan-Soto, LCSW [facility]   07/20/2019 11:00 AM Anell Barr, NP [facility]   07/26/2019  8:15 AM [place] EP REMOTE MONITORING [facility]   08/02/2019 12:00 PM Pallav Carlean Purl, MD [facility]   08/03/2019  8:20 AM Roma Schanz, MD [facility]   08/03/2019  9:00 AM Mila Merry, MD [facility]   01/16/2020  8:30 AM Monica Martinez, MD [facility]       History of Present Illness:  Jeremy Oliver is a 49 y.o. male with a history of combined systolic and diastolic heart failure who presents today for IV diuresis. He also has a history of significant CAD. Patient last seen in clinic on 06/29/2019. Since that time, he has gained the 4 pounds he lost last week during IV diuresis and gained an additional 2 pounds.  He was seen in the IV diuresis clinic on 06/29/2019 and was given furosemide 160 mg IV x2.  He had a good response despite his chronic kidney disease.  He had an improvement in symptoms.      He was seen in follow-up 07/04/19. He noted more orthopnea over the past 2 nights despite being on BiPAP.  He was seen by Dr. Cherly Hensen  in nephrology that morning and was referred back to IV diuresis.  He had generally not been weighing at home and endorsed shortness of breath with walking short distances.  He was able to walk from his car in the parking deck to the clinic lobby but needed a wheelchair to get the rest of the way into the clinic.  He had lower extremity edema to his knee.  He was taking Bumex 5 mg twice daily. Over the weekend he ate a slice of pizza that was supreme as well as have a salad with ranch dressing.  He is drinking several sodas.  He did not start hydralazine as recommended last week. He had good rsponse to IV Lasix 160mg  x1.     He was seen 4/7 for follow-up. His weight was down. He said he felt pretty good - was able to walk into the building. He was urinating a lot with the Bumex.  He was instructed to take metolazone 2.5mg  x1 that morning.     He was last seen 4/9 and felt about the same as he did at his previous visit. His breathing was fine; he was able to walk into clinic today without much difficulty. He and his wife found housing, put a deposit down w/ plans to move in the following weekend. He was not given IV Lasix due to stability of weights and worsening Cr to 5.9 after taking metolazone 2.5mg  x1. He was instructed not to repeat metolazone over the weekend.    He had repeat labs which showed Cr 5.92 and K 6.3. he was instructed to take SPS x3 days in a row and scheduled for f/u in Diuresis Clinic. He discussed dialysis w/ his nephrologist but stated he really wanted to wait to start until he was in stable housing. Today, his weight is stable and he feels about the same as last visit. His LE edema is stable. Breathing is OK, he was able to walk in today without much trouble. He is sleeping fine without orthopnea. He has tried to make some diet changes; when they had [place] food, he only used 1 packet of soy sauce instead of several.     Past Medical History:   Diagnosis Date   ??? Angina at rest (CMS-[name])    ??? Arthritis    ??? Asthma    ??? Atrial fibrillation and flutter (CMS-[name]) 06/08/14   ??? BMI 40.0-44.9, adult (CMS-[name]) 10/29/2015   ??? Chronic anticoagulation 02/17/2018   ??? Chronic kidney disease, stage 3    ??? Chronic pain syndrome 04/11/2014    ~Use daily lyrica as recommended ~Tramadol as needed for pain ~Fluoxetine daily as recommended ~Daily exercise ~Eat a healthy diet ~Good control of your blood sugars can help    ??? Coronary artery disease    ??? Depression    ??? Diabetes mellitus (CMS-[name])    ??? Eye trauma 1998    glass in both eyes and removed   ??? GERD (gastroesophageal reflux disease)    ??? Gout    ??? Homeless    ??? Hyperlipidemia    ??? Hypertension    ??? Illiteracy and low-level literacy    ??? Microscopic hematuria    ??? Migraine    ??? Nephrolithiasis    ??? 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 Nonalcoholic fatty liver disease    ??? NSTEMI (non-ST elevated myocardial infarction) (CMS-[name]) 01/13/2017   ??? Obesity    ??? Obstructive sleep apnea 2009   ??? Panic attacks    ??? 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 Seizure-like activity (CMS-[name])     ~Monitor for symptoms and report them to your primary care provider if they occur    ??? Systolic CHF, chronic (CMS-[name])     EF 40-45% 2017   ??? TIA (transient ischemic attack)      Past Surgical History:   Procedure Laterality Date   ??? CARDIAC CATHETERIZATION  03/08/2007   ??? CORONARY STENT PLACEMENT     ??? CYSTOURETHROSCOPY  12/31/2009     Microscopic hematuria   ??? KNEE SURGERY Right 09/23/2006    arthroscopic knee surgery medial and lateral meniscus tears as well as crystal disease   ??? PR CATH PLACE/CORON ANGIO, IMG SUPER/INTERP,R&L HRT CATH, L HRT VENTRIC N/A 02/13/2017    Procedure: Left/Right Heart Catheterization;  Surgeon: Marlaine Hind, MD;  Location: [facility] CATH;  Service: Cardiology   ??? PR CATH PLACE/CORON ANGIO, IMG SUPER/INTERP,W LEFT HEART VENTRICULOGRAPHY N/A 03/17/2017    Procedure: Left Heart Catheterization W Intervention;  Surgeon: Marlaine Hind, MD;  Location: [facility] CATH;  Service: Cardiology   ??? PR CATH PLACE/CORON ANGIO, IMG SUPER/INTERP,W LEFT HEART VENTRICULOGRAPHY N/A 05/12/2018    Procedure: CATH LEFT HEART CATHETERIZATION W INTERVENTION;  Surgeon: Dorathy Kinsman, MD;  Location: [facility] CATH;  Service: Cardiology   ??? PR CATH PLACE/CORON ANGIO, IMG SUPER/INTERP,W LEFT HEART VENTRICULOGRAPHY N/A 10/22/2018    Procedure: CATH LEFT HEART CATHETERIZATION W INTERVENTION;  Surgeon: Marlaine Hind, MD;  Location: [facility] CATH;  Service: Cardiology   ??? PR ECMO/ECLS INITIATION VENO-ARTERIAL N/A 03/19/2017    Procedure: ECMO/ECLS; INITIATION, VENO-ARTERIAL;  Surgeon: Lennie Odor, MD; Location: MAIN OR [facility];  Service: Cardiac Surgery   ??? PR ECMO/ECLS RMVL PRPH CANNULA OPEN 6 YRS & OLDER Left 03/25/2017    Procedure: ECMO / ECLS PROVIDED BY PHYSICIAN; REMOVAL OF PERIPHERAL (ARTERIAL AND/OR VENOUS) CANNULA(E), OPEN, 6 YEARS AND OLDER;  Surgeon: Alonna Buckler Ikonomidis, MD;  Location: MAIN OR [facility];  Service: Cardiac Surgery   ??? PR EPHYS EVAL W/ ABLATION SUPRAVENT ARRHYTHMIA N/A 07/19/2015    Catheter ablation of cavotricuspid isthmus for atrial flutter [facility])   ??? PR INSER HART PACER XVENOUS ATRIAL N/A 05/30/2015    [employer] dual-chamber pacemaker implant (E.Chung)   ??? PR INSERT/PLACE FLOW DIRECT CATH N/A 03/17/2017    Procedure: Insert Leave In [place];  Surgeon: Marlaine Hind, MD;  Location: [facility] CATH;  Service: Cardiology   ??? PR INSJ/RPLCMT PERM DFB W/TRNSVNS LDS 1/DUAL CHMBR N/A 05/04/2017    Procedure: ICD Implant System (Single/Dual);  Surgeon: Eldred Manges, MD;  Location: [facility] CATH;  Service: Cardiology   ??? PR NEGATIVE PRESSURE WOUND THERAPY DME >50 SQ CM Left 03/25/2017    Procedure: Neg Press Wound Tx (Vac Assist) Incl Topicals, Per Session, Tsa Greater Than/= 50 Cm Squared;  Surgeon: Alonna Buckler Ikonomidis, MD;  Location: MAIN OR [facility];  Service: Cardiac Surgery   ??? PR PRQ TRLUML CORONARY STENT W/ANGIO ONE ART/BRNCH N/A 03/12/2018    Procedure: Percutaneous Coronary Intervention;  Surgeon: Marlaine Hind, MD;  Location:  [facility] CATH;  Service: Cardiology   ??? PR REBL VES DIRECT,LOW EXTREM Left 03/19/2017    Procedure: Repr Bld Vessel Direct; Lower Extrem;  Surgeon: Boykin Reaper, MD;  Location: MAIN OR [facility];  Service: Vascular   ??? PR REMV ART CLOT ILIAC-POP,LEG INCIS Left 03/25/2017    Procedure: EMBOLECTOMY OR THROMBECTOMY, WITH OR WITHOUT CATHETER; FEMOROPOPLITEAL, AORTOILIAC ARTERY, BY LEG INCISION;  Surgeon: Alonna Buckler Ikonomidis, MD;  Location: MAIN OR [facility];  Service: Cardiac Surgery   ??? PR UPPER GI ENDOSCOPY,BIOPSY N/A 02/28/2016    Procedure: UGI ENDOSCOPY; WITH BIOPSY, SINGLE OR MULTIPLE;  Surgeon: Liane Comber, MD;  [facility];  Service: Gastroenterology   ??? PR UPPER GI ENDOSCOPY,DIAGNOSIS N/A 05/07/2016    Procedure: UGI ENDO, INCLUDE ESOPHAGUS, STOMACH, & DUODENUM &/OR JEJUNUM; DX W/WO COLLECTION SPECIMN, BY BRUSH OR WASH;  Surgeon: Modena Nunnery, MD;  [facility];  Service: Gastroenterology       Current Outpatient Medications   Medication Sig Dispense Refill   ??? acetaminophen (TYLENOL) 500 MG tablet TAKE 2 TABLETS (1 000MG ) BY MOUTH EVERY 8 HOURS AS NEEDED FOR PAIN 180 each 3   ??? albuterol HFA 90 mcg/actuation inhaler Inhale 2 puffs into the lungs every 6 (six) hours as needed for wheezing or shortness of breath. 18 g 2   ??? alirocumab (PRALUENT) 150 mg/mL subcutaneous injection Inject the contents of 1 pen (150 mg total) under the skin every fourteen (14) days. 6 mL 3   ??? allopurinoL (ZYLOPRIM) 100 MG tablet Take 2 tablets (200 mg total) by mouth daily. 60 tablet 5   ??? amiodarone (PACERONE) 200 MG tablet TAKE 1 TABLET BY MOUTH ONCE DAILY 90 tablet 3   ??? amLODIPine (NORVASC) 10 MG tablet Take 1 tablet (10 mg total) by mouth daily. 90 tablet 3   ??? atorvastatin (LIPITOR) 80 MG tablet Take 1 tablet (80 mg total) by mouth daily. 90 tablet 3   ??? bumetanide (BUMEX) 2 MG tablet Take 2.5 tablets (5 mg total) by mouth two (2) times a day. 450 tablet 3   ??? carvediloL (COREG) 25 MG tablet Take 1.5 tablets (37.5 mg total) by mouth Two (2) times a day. 270 tablet 3   ??? diclofenac sodium (VOLTAREN) 1 % gel Apply 2 grams topically Three (3) times a day. 180 g 11   ??? ezetimibe (ZETIA) 10 mg tablet TAKE 1 TABLET BY MOUTH ONCE DAILY 90 each 3   ??? FLUoxetine (PROZAC) 40 MG capsule Take 2 capsules (80 mg total) by mouth daily. 180 capsule 3   ??? gabapentin (NEURONTIN) 300 MG capsule Take 1 capsule (300 mg total) by mouth two (2) times a day. 180 capsule 3   ??? insulin ASPART (NOVOLOG FLEXPEN U-100 INSULIN) 100 unit/mL (3 mL) injection pen Inject 20 units under the skin three (3) times a day with a meal. 15 mL 11   ??? insulin detemir U-100 (LEVEMIR) 100 unit/mL (3 mL) injection pen Inject 45 Units total under the skin nightly. 15 mL 3   ??? insulin syringe-needle U-100 0.3 mL 31 gauge x 5/16 Syrg Use to inject insulin twice daily with meals 60 each 0   ??? isosorbide mononitrate (IMDUR) 30 MG 24 hr tablet Take 4 tablets (120 mg total) by mouth daily. 120 tablet 3   ??? omeprazole (PRILOSEC) 40 MG capsule Take 1 capsule (40 mg total) by mouth Two (2) times a day (30 minutes before a meal). 180 capsule 3   ???  rivaroxaban (XARELTO) 15 mg Tab Take 1 tablet (15 mg total) by mouth daily with evening meal. 90 tablet 3   ??? semaglutide 0.25 mg or 0.5 mg(2 mg/1.5 mL) PnIj Inject 0.5 mg under the skin every seven (7) days. 4.5 mL 3   ??? sevelamer (RENVELA) 800 mg tablet TAKE 2 TABLETS (1600MG ) BY MOUTH THREE TIMES DAILY WITH MEALS 540 tablet 3   ??? ticagrelor (BRILINTA) 90 mg Tab TAKE 1 TABLET BY MOUTH TWICE DAILY 180 tablet 3   ??? blood sugar diagnostic Strp Disp to match current ins approved meter. Use to test blood sugar four times daily. E11.22, Z79.4 400 each 3   ??? blood-glucose meter kit Disp. blood glucose meter kit preferred by patient's insurance. Dx: Diabetes, E11.9 1 each 11   ??? lancets Misc Disp. Ins preferred lancets. Use to test blood sugar four times daily. E11.22, Z79.4 400 each 3   ??? loperamide (IMODIUM) 2 mg capsule Take 1 capsule (2 mg total) by mouth 4 (four) times a day as needed for diarrhea. (Patient not taking: Reported on 07/14/2019) 30 capsule 0   ??? miscellaneous medical supply (BLOOD PRESSURE CUFF) Misc Measure BP once a day or if any symptoms 1 each 0   ??? nitroglycerin (NITROSTAT) 0.4 MG SL tablet Place 1 tablet (0.4 mg total) under the tongue every five (5) minutes as needed for chest pain. (Patient not taking: Reported on 07/06/2019) 100 each 3   ??? ondansetron (ZOFRAN) 4 MG tablet Take 1 tablet (4 mg total) by mouth every eight (8) hours as needed for nausea for up to 4 days. (Patient not taking: Reported on 07/14/2019) 10 tablet 0   ??? ondansetron (ZOFRAN) 4 MG tablet Take 1 tablet (4 mg total) by mouth every eight (8) hours as needed for nausea. (Patient not taking: Reported on 07/14/2019) 30 tablet 0   ??? sodium polystyrene, SPS, with sorbitol (SPS, WITH SORBITOL,) 15-20 gram/60 mL Susp Take 60 mL (15 g total) by mouth daily for 3 days. (Patient not taking: Reported on 07/14/2019) 180 mL 2   ??? sodium polystyrene, SPS, with sorbitol (SPS, WITH SORBITOL,) 15-20 gram/60 mL Susp Take 60 mL (15 g) by mouth once a week. (Patient not taking: Reported on 07/14/2019) 473 mL 3   ??? UNIFINE PENTIPS 31 gauge x 5/16 (8 mm) Ndle USE WITH INSULIN AND VICTOZA 5 TIMES DAILY 400 each 3     No current facility-administered medications for this visit.     Allergies   Allergen Reactions   ??? Losartan      Hyperkalemia (K>6)   ??? Opioids - Morphine Analogues Anxiety       Objective:       Physical Exam  BP 164/88  - Pulse 73  - Temp 36.2 ??C (97.2 ??F)  - Resp 20  - Ht 188 cm (6' 2)  - Wt (!) 172.4 kg (380 lb)  - SpO2 98%  - BMI 48.79 kg/m??    Wt Readings from Last 12 Encounters:   07/14/19 (!) 171.7 kg (378 lb 9.6 oz)   07/14/19 (!) 172.4 kg (380 lb)   07/08/19 (!) 172.6 kg (380 lb 8 oz)   07/06/19 (!) 171.9 kg (378 lb 14.4 oz)   07/04/19 (!) 174.1 kg (383 lb 12.8 oz)   07/04/19 (!) 176.6 kg (389 lb 4.8 oz)   06/29/19 (!) 172.8 kg (380 lb 14.4 oz)   06/29/19 (!) 174.5 kg (384 lb 9.6 oz)   06/27/19 (!) 173.7 kg (  383 lb)   06/27/19 (!) 174.8 kg (385 lb 6.4 oz)   05/10/19 (!) 162.1 kg (357 lb 6.4 oz)   04/14/19 (!) 159 kg (350 lb 9.6 oz)       General:  Patient is chronically ill-appearing in no acute distress   Eyes:  Intact, sclerae anicteric.   Ears, nose, mouth: Benign   Respiratory:   Normal respiratory effort. Clear to auscultation bilaterally.  There are no wheezes.   Cardiovascular:  JVP not seen above the clavicle with HOB at 90 degrees.  Rate and rhythm are regular.  There is no lifts or heaves.  Normal S1, S2. There is no murmur, gallops or rubs, Radial and pedal pulses are 1-2+, bilaterally.   There is 1- 2+ pitting pedal/pretibial edema, bilaterally, stable from prior.   Gastrointestinal:   Soft, non-tender, with audible bowel sounds. Abdomen nondistended.  Liver is nonpalpable.   Musculoskeletal: No joint swelling   Skin: Warm, well perfused.   Neurologic: Appropriate mood and affect. Alert and oriented to person, place, and time. No gross motor or sensory deficits evident.     Recent Labs:  Office Visit on 07/14/2019   Component Date Value Ref Range Status   ??? Magnesium 07/14/2019 1.9  1.6 - 2.6 mg/dL Final   ??? Sodium, POCT 07/14/2019 137  135 - 145 mmol/L Final   ??? Potassium, POCT 07/14/2019 5.5* 3.5 - 5.0 mmol/L Final   ??? Chloride, POCT 07/14/2019 102  98 - 107 mmol/L Final   ??? CO2, POCT 07/14/2019 25  22 - 30 mmol/L Final   ??? BUN, POCT 07/14/2019 51* 7 - 21 mg/dL Final   ??? Creatinine, POCT 07/14/2019 5.6* 0.7 - 1.3 mg/dL Final   ??? Glucose, POCT 07/14/2019 281* 70 - 179 mg/dL Final   ??? Calcium, POCT 07/14/2019 8.5  8.5 - 10.2 mg/dL Final   Appointment on 07/11/2019   Component Date Value Ref Range Status   ??? Sodium 07/11/2019 137  135 - 145 mmol/L Final   ??? Potassium 07/11/2019 6.3* 3.5 - 5.0 mmol/L Final   ??? Chloride 07/11/2019 100  98 - 107 mmol/L Final   ??? CO2 07/11/2019 24.0  22.0 - 30.0 mmol/L Final   ??? Anion Gap 07/11/2019 13  7 - 15 mmol/L Final   ??? BUN 07/11/2019 55* 7 - 21 mg/dL Final   ??? Creatinine 07/11/2019 5.92* 0.70 - 1.30 mg/dL Final   ??? BUN/Creatinine Ratio 07/11/2019 9   Final   ??? EGFR CKD-EPI Non-African American,* 07/11/2019 10* >=60 mL/min/1.25m2 Final   ??? EGFR CKD-EPI African American, Male 07/11/2019 12* >=60 mL/min/1.66m2 Final   ??? Glucose 07/11/2019 353* 70 - 179 mg/dL Final   ??? Calcium 66/08/3014 8.5  8.5 - 10.2 mg/dL Final   Office Visit on 07/08/2019   Component Date Value Ref Range Status   ??? Magnesium 07/08/2019 1.8  1.6 - 2.6 mg/dL Final   ??? PRO-BNP 04/08/3233 4,050.0* 0.0 - 138.0 pg/mL Final   ??? Sodium, POCT 07/08/2019 132* 135 - 145 mmol/L Final   ??? Potassium, POCT 07/08/2019 5.6* 3.5 - 5.0 mmol/L Final   ??? Chloride, POCT 07/08/2019 102  98 - 107 mmol/L Final   ??? CO2, POCT 07/08/2019 25  22 - 30 mmol/L Final   ??? BUN, POCT 07/08/2019 51* 7 - 21 mg/dL Final   ??? Creatinine, POCT 07/08/2019 5.9* 0.7 - 1.3 mg/dL Final   ??? Glucose, POCT 07/08/2019 344* 70 - 179 mg/dL Final   ??? Calcium, POCT  07/08/2019 8.4* 8.5 - 10.2 mg/dL Final       Lab Results   Component Value Date    PRO-BNP 4,050.0 (H) 07/08/2019    PRO-BNP 5,610.0 (H) 07/06/2019    PRO-BNP 5,180.0 (H) 07/04/2019    PRO-BNP 3,960.0 (H) 06/29/2019    PRO-BNP 4,671.0 (H) 06/27/2019    PRO-BNP 231 (H) 06/08/2014    Creatinine, POCT 5.6 (H) 07/14/2019    Creatinine, POCT 5.9 (H) 07/08/2019    Creatinine, POCT 5.1 (H) 07/06/2019    Creatinine, POCT 5.0 (H) 07/04/2019    Creatinine, POCT 5.1 (H) 06/29/2019    Creatinine 5.92 (H) 07/11/2019    BUN 55 (H) 07/11/2019    BUN, POCT 51 (H) 07/14/2019    BUN, POCT 51 (H) 07/08/2019    BUN, POCT 44 (H) 07/06/2019    BUN, POCT 44 (H) 07/04/2019    BUN, POCT 42 (H) 06/29/2019    Magnesium 1.9 07/14/2019    Magnesium 1.8 07/08/2019    Magnesium 1.8 07/06/2019    Magnesium 1.9 07/04/2019    Magnesium 1.9 06/29/2019    Magnesium 2.0 06/09/2014    Magnesium 1.7 05/12/2011    Magnesium 1.8 01/09/2011       Metric Tracker:  Did today's visit result in ED visit? No  Did today's visit result in hospital admission? No  Did today's visit result in referral to cardiology? n/a  Today's visit was a referral from Nephrology

## 2019-07-14 NOTE — Unmapped (Signed)
CCM Intake      PCP Action:None       Pt's wife confirms appt tomorrow w/ PCP.  States this weekend they are moving into their new home.  Verbalizes understanding that pt will start Dialysis this week have been advised for start dialysis for months. pt does understand importance of starting asap.     Pt will discuss w/ provider @ appt tomorrow.

## 2019-07-14 NOTE — Unmapped (Signed)
I was the supervising physician in the delivery of the service. Savion Washam D Saharsh Sterling, MD

## 2019-07-15 ENCOUNTER — Ambulatory Visit: Admit: 2019-07-15 | Discharge: 2019-07-16 | Payer: MEDICARE

## 2019-07-15 DIAGNOSIS — I5043 Acute on chronic combined systolic (congestive) and diastolic (congestive) heart failure: Principal | ICD-10-CM

## 2019-07-15 DIAGNOSIS — E1122 Type 2 diabetes mellitus with diabetic chronic kidney disease: Secondary | ICD-10-CM

## 2019-07-15 DIAGNOSIS — Z794 Long term (current) use of insulin: Principal | ICD-10-CM

## 2019-07-15 DIAGNOSIS — N184 Chronic kidney disease, stage 4 (severe): Principal | ICD-10-CM

## 2019-07-15 MED ORDER — HYDROXYZINE HCL 10 MG TABLET
ORAL_TABLET | Freq: Every evening | ORAL | 0 refills | 30.00000 days | Status: CP | PRN
Start: 2019-07-15 — End: 2019-08-14
  Filled 2019-07-15: qty 30, 30d supply, fill #0

## 2019-07-15 MED ORDER — INSULIN ASPART (U-100) 100 UNIT/ML (3 ML) SUBCUTANEOUS PEN
Freq: Three times a day (TID) | SUBCUTANEOUS | 11 refills | 23.00000 days | Status: CP
Start: 2019-07-15 — End: ?
  Filled 2019-07-15: qty 15, 23d supply, fill #0

## 2019-07-15 MED FILL — NOVOLOG FLEXPEN U-100 INSULIN ASPART 100 UNIT/ML (3 ML) SUBCUTANEOUS: 23 days supply | Qty: 15 | Fill #0 | Status: AC

## 2019-07-15 MED FILL — HYDROXYZINE HCL 10 MG TABLET: 30 days supply | Qty: 30 | Fill #0 | Status: AC

## 2019-07-15 NOTE — Unmapped (Addendum)
??   Increase your Novolog to 22 units with meals. Keep the Levemir at 45 units.   ?? After about 1 week, if not in the hospital and blood sugars are still over 200, then increase to Novolog 24 units with meals  ?? Hydroxyzine only when needed at night for anxiety

## 2019-07-15 NOTE — Unmapped (Signed)
[facility] - [facility]  Established Visit    Reason for Visit:  CKD stage 4. Acute on chronic HFrEF    1. CKD (chronic kidney disease) stage 4, GFR 15-29 ml/min (CMS-[name])    2. Type 2 diabetes mellitus with stage 4 chronic kidney disease, with long-term current use of insulin (CMS-[name])    3. Acute on chronic combined systolic and diastolic heart failure (CMS-[name])        Assessment/Plans:  1. CKD (chronic kidney disease) stage 4, GFR 15-29 ml/min (CMS-[name])  Spent significant amount of time discussing transition to dialysis. Has been working closely with outpatient palliative care.  Does not recall speaking with transplant coordinate.  We discussed why he is not a home dialysis candidate at this time. Could re-evaluate in the future.    - encouraged to continue to take Bumex as prescribed given that fluid overload and hyperkalemia are primary issues at this point    2. Type 2 diabetes mellitus with stage 4 chronic kidney disease, with long-term current use of insulin (CMS-[name])  BGs continue to be uncontrolled.  Increase his mealtime insulin slightly to get better control  Advised both patient and wife that once he starts HD, insulin requirements might go down and to be prepared for this  - insulin ASPART (NOVOLOG FLEXPEN U-100 INSULIN) 100 unit/mL (3 mL) injection pen; Inject 0.22 mL (22 Units total) under the skin Three (3) times a day with a meal.  Dispense: 15 mL; Refill: 11    3. Acute on chronic combined systolic and diastolic heart failure (CMS-[name])  Has close follow-up with diuresis clinic to continue outpatient management until he gets bed for hosptialization and start of HD.   Planning for [facility] while he is hospitalized.     Advance Care Planning     Participants: Shawn Route Erekson, [name] [facility])  Discussion/Action: What he has on file is still accurate.  Wife, [name], is primary. His primary concern is being burden on family but defers decisions if he is incapacitated to his wife, [name], with consultation by his daughter [name].  [name] states that she plans to keep him on machines as long as possible.  Will remain full code in system    Health Care Decision Maker as of 07/21/2019    [name] [facility]): Marschall,[name] - [phone]    [name], [facility]: Sandoz,Bridgette - Daughter - [phone]         Already scheduled for 5/5 for follow-up    Medication adherence and barriers to the treatment plan have been addressed. Opportunities to optimize healthy behaviors have been discussed. Patient / caregiver voiced understanding.      I personally spent 47 minutes face-to-face and non-face-to-face in the care of this patient, which includes all pre, intra, and post visit time on the date of service.  __________________________________________________________    HPI:  Presents with wife.   Had visit yesterday with diuresis clinic. Feels like IV diuresis works just as well as taking Bumex.  Aware of issues with hyperkalemia.  Understand plan for admission for start of dialysis.  Explained need for catheter placement.  He understands plan for [facility] with Dr. Andrey Farmer while hospitalized.  Has questions about home HD.   Had good visit with grandson in [place].    Daughter is still worried about him starting dialysis and dying.     We discuss advanced directives.        Medications:  Reviewed in EPIC  Physical Exam:   Vital Signs:   BP 129/65 (BP Site: L Arm)  - Pulse 70  - Temp 36 ??C (96.8 ??F) (Temporal)  - Ht 188 cm (6' 2)  - Wt (!) 173.7 kg (383 lb)  - SpO2 97%  - BMI 49.17 kg/m??   Gen: Chronically ill appearing, NAD  CV: RRR, no murmurs  Pulm: CTA bilaterally, no crackles or wheezes  MSK: 2+ bilateral pitting edema

## 2019-07-15 NOTE — Unmapped (Signed)
[facility] BPs  BP#1 124/61   BP#2 130/67  BP#3 132/67    Average BP 129/65  (please note this as a comment in vitals)

## 2019-07-18 NOTE — Unmapped (Unsigned)
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, they babysit their grandson often.    06/14/18: Pt and husband living with daughter while new location to live is determined.     Social Determinants of Health     Financial Resource Strain:    ??? Difficulty of Paying Living Expenses:    Food Insecurity: Food Insecurity Present   ??? Worried About Programme researcher, broadcasting/film/video in the Last Year: Sometimes true   ??? Ran Out of Food in the Last Year: Sometimes true   Transportation Needs:    ??? Lack of Transportation (Medical):    ??? Lack of Transportation (Non-Medical):    Physical Activity:    ??? Days of Exercise per Week:    ??? Minutes of Exercise per Session:    Stress:    ??? Feeling of Stress :    Social Connections:    ??? Frequency of Communication with Friends and Family:    ??? Frequency of Social Gatherings with Friends and Family:    ??? Attends Religious Services:    ??? Database administrator or Organizations:    ??? Attends Engineer, structural:    ??? Marital Status:          LEGAL INVOLVEMENT:  {yes/no:21137}      FAMILY HISTORY:   Family History   Problem Relation Age of Onset   ??? Diabetes Mother    ??? Hyperlipidemia Mother    ??? Heart disease Mother    ??? Basal cell carcinoma Mother    ??? Blindness Mother         diabeties   ??? Obesity Mother    ??? Hyperlipidemia Father    ??? Heart disease Father    ??? Lung disease Father    ??? Heart  disease Half-Sister    ??? Kidney disease Half-Sister    ??? Gallbladder disease Half-Brother    ??? Obesity Half-Brother    ??? No Known Problems Daughter    ??? No Known Problems Son    ??? Obesity Daughter    ??? Melanoma Neg Hx    ??? Squamous cell carcinoma Neg Hx    ??? Macular degeneration Neg Hx    ??? Glaucoma Neg Hx          OTHER HISTORY OR INFORMATION:     *** ===================================================================  ASSESSMENT      ENGAGEMENT:  Patient presented a willingness to participate in treatment  PARTICIPATION QUALITY: {ddparticipationqualities:22407}  APPEARANCE:    {Blank multiple:19196::Tall,Average height,Short,Petite,***} {Blank multiple:19196::normal weight,over weight,obese,morbidly obese,thin,very thin,,***} {Blank multiple:19196::Caucasian,African American,Latina,Latino,Native American,Asian,***} {Blank multiple:19196::woman,man,***} {Blank multiple:19196::appropriately dressed and groomed,appropriately groomed and casually dressed,appropriately groomed and neatly dressed,***}.  Hair  Skin  Dentition  Wearing  Appears {Blank multiple:19196::stated age,younger than stated age,older than stated age,***}.  Ambulates {Blank multiple:19196::without aid,with aid of,***} .    ORIENTED:   x3  BEHAVIOR:  sat comfortably in chair  MEMORY (recent & remote):  {ddmemoryoptions:22409}  ATTENTION:  {ddattn:22411}  SPEECH (language): {ddspeech:22412}  THOUGHT PROCESSING: linear, relevant, coherent   {ddthought:22413}  THOUGHT CONTENT:  denies SI, HI, AVH. no delusions apparent ***  COGNITIVE: alert, attentive, clear level of consciousness ***  JUDGMENT:  {ddjudgement:22414}  INSIGHT:   {ddinsight:22415}  MOOD:   {MOOD:23791}   {ddmood:22416}  AFFECT:   {RRAFFECT:24213}      MODES OF INTERVENTION used in this session: Assessment, Exploration, Support, Clarification, Education and Life review      ==================================================================   TREATMENT PLAN      Identified treatment goals:  {Blank multiple:19196::Reduce PHQ / GAD by 5 points,Get or keep PHQ / GAD under 10 points,stress management,self-care management,***}     Patient-stated health goal:   ***      Next visit to provide introduction to problem solving treatment and mind body stress reduction skill training for:  ***    Consider benefit of ***  Recommend ***   psychiatric assessment    medication adjustment   referral for local continuity counseling, psychiatry      RCO in {NUMBERS 1-10:18281} weeks by ***.      Assessed by IM Counseling Staff:  ***    Primary Care Physician (name): Kurtis Bushman, MD    Note copied to:  Kurtis Bushman, MD  Thank you for the referral and the opportunity to be of service.     If patient doesn't engage in counseling here and/or local supports are indicated, please give patient [employer] [phone] for linkage to local services, 24 hour crisis and mobile crisis supports.

## 2019-07-19 ENCOUNTER — Ambulatory Visit: Admit: 2019-07-19 | Discharge: 2019-07-19 | Payer: MEDICARE | Attending: Clinical | Primary: Clinical

## 2019-07-19 ENCOUNTER — Ambulatory Visit: Admit: 2019-07-19 | Discharge: 2019-07-19 | Payer: MEDICARE | Attending: Adult Health | Primary: Adult Health

## 2019-07-19 DIAGNOSIS — I5042 Chronic combined systolic (congestive) and diastolic (congestive) heart failure: Principal | ICD-10-CM

## 2019-07-19 LAB — MAGNESIUM: Magnesium:MCnc:Pt:Ser/Plas:Qn:: 1.9

## 2019-07-19 NOTE — Unmapped (Signed)
[place] IV [facility]uresis Clinic Note    Primary Cardiologist/Cardiology Provider(s):  Dr. Hoy Finlay  PCP:  Kurtis Bushman, MD    Last Cardiology Clinic Visit:  07/14/2019  Last IV Diuresis Visit: 07/14/2019    Reason for Visit:  Jeremy Oliver is a 49 y.o. male with a history of combined systolic and diastolic heart failure, who is being seen today in the [facility] for an urgent visit for volume status optimization including IV diuresis.    Assessment/Plan:  1. Acute on Chronic combined systolic and diastolic heart failure, acute on chronic kidney disease  - Volume status today is stable. 388 --> 379 --> 380.5 --> 380 --> 383  - NYHA Class II-III  - IV Lasix 160mg  x2 was administered during clinic with good response  - Labs were obtained: Cr 5.8, K 5.9  - his living situation is likely contributing to his inability to be compliant with diet and weight monitoring; just recently moved into housing  - continue Bumex 5 mg twice daily. Continue to work on reducing sodium.   - Plan for direct admission; return tomorrow to diuresis clinic if no bed    Response to IV diuresis:         Medications Heart rate Blood pressure Weight (lbs)   Pre  83 160/84 383   Hour 0 IV Lasix 160 mg      Hour 2 IV Lasix 160 mg 88 179/88    Post    377     Output:  I/O       04/18 0701 - 04/19 0700 04/19 0701 - 04/20 0700 04/20 0701 - 04/21 0700    Urine (mL/kg/hr)   1350    Total Output(mL/kg)   1350 (7.9)    Net   -1350               Return in about 1 day (around 07/20/2019) for Diuresis Clinic.      Future Appointments   Date Time Provider Department Center   07/20/2019 11:00 AM Alene Mires Farmer, NP [facility]   07/21/2019  9:00 AM DIURESIS HF NP [place] [facility]   07/26/2019  8:15 AM [place] [facility] REMOTE MONITORING [facility]   08/02/2019 12:00 PM Pallav Carlean Purl, MD [facility]   08/03/2019  8:20 AM Roma Schanz, MD [facility]   08/03/2019  9:00 AM Mila Merry, MD [facility]   01/16/2020  8:30 AM Monica Martinez, MD [facility]       History of Present Illness:  Jeremy Oliver is a 49 y.o. male with a history of combined systolic and diastolic heart failure who presents today for IV diuresis. He also has a history of significant CAD. Patient last seen in clinic on 06/29/2019. Since that time, he has gained the 4 pounds he lost last week during IV diuresis and gained an additional 2 pounds.  He was seen in the IV diuresis clinic on 06/29/2019 and was given furosemide 160 mg IV x2.  He had a good response despite his chronic kidney disease.  He had an improvement in symptoms.      He was seen in follow-up 07/04/19. He noted more orthopnea over the past 2 nights despite being on BiPAP.  He was seen by Dr. Cherly Hensen in nephrology that morning and was referred back to IV diuresis.  He had generally not been weighing at home and endorsed shortness  of breath with walking short distances.  He was able to walk from his car in the parking deck to the clinic lobby but needed a wheelchair to get the rest of the way into the clinic.  He had lower extremity edema to his knee.  He was taking Bumex 5 mg twice daily. Over the weekend he ate a slice of pizza that was supreme as well as have a salad with ranch dressing.  He is drinking several sodas.  He did not start hydralazine as recommended last week. He had good rsponse to IV Lasix 160mg  x1.     He was seen 4/7 for follow-up. His weight was down. He said he felt pretty good - was able to walk into the building. He was urinating a lot with the Bumex.  He was instructed to take metolazone 2.5mg  x1 that morning.     He was last seen 4/9 and felt about the same as he did at his previous visit. His breathing was fine; he was able to walk into clinic today without much difficulty. He and his wife found housing, put a deposit down w/ plans to move in the following weekend. He was not given IV Lasix due to stability of weights and worsening Cr to 5.9 after taking metolazone 2.5mg  x1. He was instructed not to repeat metolazone over the weekend.    He had repeat labs which showed Cr 5.92 and K 6.3. he was instructed to take SPS x3 days in a row and scheduled for f/u in Diuresis Clinic. He discussed dialysis w/ his nephrologist but stated he really wanted to wait to start until he was in stable housing. Last seen 4/15 and weight was stable and he felt about the same as last visit. His LE edema was stable. Breathing was OK, he was able to walk in without much trouble. He was sleeping fine without orthopnea. He had tried to make some diet changes; when they had [place] food, he only used 1 packet of soy sauce instead of several. He was diuresed with IV Lasix 160mg  x1.    Today he presents for follow-up. He has been planning direct admission w/ his nephrologist to start dialysis, but there is no open bed yet. He feels his LE edema has improved. He was again able to walk in without a wheelchair. He isn't sleeping well, feels like he is drowning when he lays back. He was previously sleeping on a couch and now that he has moved into housing, he is sleeping in a bed. He felt better when he laid on his side.     Past Medical History:   Diagnosis Date   ??? Angina at rest (CMS-[name])    ??? Arthritis    ??? Asthma    ??? Atrial fibrillation and flutter (CMS-[name]) 06/08/14   ??? BMI 40.0-44.9, adult (CMS-[name]) 10/29/2015   ??? Chronic anticoagulation 02/17/2018   ??? Chronic kidney disease, stage 3    ??? Chronic pain syndrome 04/11/2014    ~Use daily lyrica as recommended ~Tramadol as needed for pain ~Fluoxetine daily as recommended ~Daily exercise ~Eat a healthy diet ~Good control of your blood sugars can help    ??? Coronary artery disease    ??? Depression    ??? Diabetes mellitus (CMS-[name])    ??? Eye trauma 1998    glass in both eyes and removed   ??? GERD (gastroesophageal reflux disease)    ??? Gout    ??? Homeless    ??? Hyperlipidemia    ???  Hypertension    ??? Illiteracy and low-level literacy    ??? Microscopic hematuria    ??? Migraine    ??? Nephrolithiasis    ??? 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 Nonalcoholic fatty liver disease    ??? NSTEMI (non-ST elevated myocardial infarction) (CMS-[name]) 01/13/2017   ??? Obesity    ??? Obstructive sleep apnea 2009   ??? Panic attacks    ??? 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 Seizure-like activity (CMS-[name])     ~Monitor for symptoms and report them to your primary care provider if they occur    ??? Systolic CHF, chronic (CMS-[name])     EF 40-45% 2017   ??? TIA (transient ischemic attack)      Past Surgical History:   Procedure Laterality Date   ??? CARDIAC CATHETERIZATION  03/08/2007   ??? CORONARY STENT PLACEMENT     ??? CYSTOURETHROSCOPY  12/31/2009     Microscopic hematuria   ??? KNEE SURGERY Right 09/23/2006    arthroscopic knee surgery medial and lateral meniscus tears as well as crystal disease   ??? PR CATH PLACE/CORON ANGIO, IMG SUPER/INTERP,R&L HRT CATH, L HRT VENTRIC N/A 02/13/2017    Procedure: Left/Right Heart Catheterization;  Surgeon: Marlaine Hind, MD;  Location: [facility] CATH;  Service: Cardiology   ??? PR CATH PLACE/CORON ANGIO, IMG SUPER/INTERP,W LEFT HEART VENTRICULOGRAPHY N/A 03/17/2017    Procedure: Left Heart Catheterization W Intervention;  Surgeon: Marlaine Hind, MD;  Location: [facility] CATH;  Service: Cardiology   ??? PR CATH PLACE/CORON ANGIO, IMG SUPER/INTERP,W LEFT HEART VENTRICULOGRAPHY N/A 05/12/2018    Procedure: CATH LEFT HEART CATHETERIZATION W INTERVENTION;  Surgeon: Dorathy Kinsman, MD;  Location: [facility] CATH;  Service: Cardiology   ??? PR CATH PLACE/CORON ANGIO, IMG SUPER/INTERP,W LEFT HEART VENTRICULOGRAPHY N/A 10/22/2018    Procedure: CATH LEFT HEART CATHETERIZATION W INTERVENTION;  Surgeon: Marlaine Hind, MD;  Location: [facility] CATH;  Service: Cardiology   ??? PR ECMO/ECLS INITIATION VENO-ARTERIAL N/A 03/19/2017    Procedure: ECMO/ECLS; INITIATION, VENO-ARTERIAL;  Surgeon: Lennie Odor, MD;  Location: MAIN OR [facility];  Service: Cardiac Surgery   ??? PR ECMO/ECLS RMVL PRPH CANNULA OPEN 6 YRS & OLDER Left 03/25/2017    Procedure: ECMO / ECLS PROVIDED BY PHYSICIAN; REMOVAL OF PERIPHERAL (ARTERIAL AND/OR VENOUS) CANNULA(E), OPEN, 6 YEARS AND OLDER;  Surgeon: Alonna Buckler Ikonomidis, MD;  Location: MAIN OR [facility];  Service: Cardiac Surgery   ??? PR EPHYS EVAL W/ ABLATION SUPRAVENT ARRHYTHMIA N/A 07/19/2015    Catheter ablation of cavotricuspid isthmus for atrial flutter [facility])   ??? PR INSER HART PACER XVENOUS ATRIAL N/A 05/30/2015    [employer] dual-chamber pacemaker implant (E.Chung)   ??? PR INSERT/PLACE FLOW DIRECT CATH N/A 03/17/2017    Procedure: Insert Leave In [place];  Surgeon: Marlaine Hind, MD;  Location: [facility] CATH;  Service: Cardiology   ??? PR INSJ/RPLCMT PERM DFB W/TRNSVNS LDS 1/DUAL CHMBR N/A 05/04/2017    Procedure: ICD Implant System (Single/Dual);  Surgeon: Eldred Manges, MD;  Location: [facility] CATH;  Service: Cardiology   ??? PR NEGATIVE PRESSURE WOUND  THERAPY DME >50 SQ CM Left 03/25/2017    Procedure: Neg Press Wound Tx (Vac Assist) Incl Topicals, Per Session, Tsa Greater Than/= 50 Cm Squared;  Surgeon: Alonna Buckler Ikonomidis, MD;  Location: MAIN OR [facility];  Service: Cardiac Surgery   ??? PR PRQ TRLUML CORONARY STENT W/ANGIO ONE ART/BRNCH N/A 03/12/2018    Procedure: Percutaneous Coronary Intervention;  Surgeon: Marlaine Hind, MD;  Location: [facility] CATH;  Service: Cardiology   ??? PR REBL VES DIRECT,LOW EXTREM Left 03/19/2017    Procedure: Repr Bld Vessel Direct; Lower Extrem;  Surgeon: Boykin Reaper, MD;  Location: MAIN OR [facility];  Service: Vascular   ??? PR REMV ART CLOT ILIAC-POP,LEG INCIS Left 03/25/2017    Procedure: EMBOLECTOMY OR THROMBECTOMY, WITH OR WITHOUT CATHETER; FEMOROPOPLITEAL, AORTOILIAC ARTERY, BY LEG INCISION;  Surgeon: Alonna Buckler Ikonomidis, MD;  Location: MAIN OR [place] [place];  Service: Cardiac Surgery   ??? PR UPPER GI ENDOSCOPY,BIOPSY N/A 02/28/2016    Procedure: UGI ENDOSCOPY; WITH BIOPSY, SINGLE OR MULTIPLE;  Surgeon: Liane Comber, MD;  Location: GI [facility];  Service: Gastroenterology   ??? PR UPPER GI ENDOSCOPY,DIAGNOSIS N/A 05/07/2016    Procedure: UGI ENDO, INCLUDE ESOPHAGUS, STOMACH, & DUODENUM &/OR JEJUNUM; DX W/WO COLLECTION SPECIMN, BY BRUSH OR WASH;  Surgeon: Modena Nunnery, MD;  Location: [facility];  Service: Gastroenterology       Current Outpatient Medications   Medication Sig Dispense Refill   ??? acetaminophen (TYLENOL) 500 MG tablet TAKE 2 TABLETS (1 000MG ) BY MOUTH EVERY 8 HOURS AS NEEDED FOR PAIN 180 each 3   ??? albuterol HFA 90 mcg/actuation inhaler Inhale 2 puffs into the lungs every 6 (six) hours as needed for wheezing or shortness of breath. 18 g 2   ??? alirocumab (PRALUENT) 150 mg/mL subcutaneous injection Inject the contents of 1 pen (150 mg total) under the skin every fourteen (14) days. 6 mL 3   ??? allopurinoL (ZYLOPRIM) 100 MG tablet Take 2 tablets (200 mg total) by mouth daily. 60 tablet 5   ??? amiodarone (PACERONE) 200 MG tablet TAKE 1 TABLET BY MOUTH ONCE DAILY 90 tablet 3   ??? amLODIPine (NORVASC) 10 MG tablet Take 1 tablet (10 mg total) by mouth daily. 90 tablet 3   ??? atorvastatin (LIPITOR) 80 MG tablet Take 1 tablet (80 mg total) by mouth daily. 90 tablet 3   ??? blood sugar diagnostic Strp Disp to match current ins approved meter. Use to test blood sugar four times daily. E11.22, Z79.4 400 each 3   ??? blood-glucose meter kit Disp. blood glucose meter kit preferred by patient's insurance. Dx: Diabetes, E11.9 1 each 11   ??? bumetanide (BUMEX) 2 MG tablet Take 2.5 tablets (5 mg total) by mouth two (2) times a day. 450 tablet 3   ??? carvediloL (COREG) 25 MG tablet Take 1.5 tablets (37.5 mg total) by mouth Two (2) times a day. 270 tablet 3   ??? diclofenac sodium (VOLTAREN) 1 % gel Apply 2 grams topically Three (3) times a day. 180 g 11   ??? ezetimibe (ZETIA) 10 mg tablet TAKE 1 TABLET BY MOUTH ONCE DAILY 90 each 3   ??? FLUoxetine (PROZAC) 40 MG capsule Take 2 capsules (80 mg total) by mouth daily. 180 capsule 3   ??? gabapentin (NEURONTIN) 300 MG capsule Take 1 capsule (300 mg total) by mouth two (2) times a day. 180 capsule 3   ??? hydrOXYzine (ATARAX) 10 MG tablet Take 1 tablet (10 mg total) by  mouth nightly as needed for anxiety. 30 tablet 0   ??? insulin ASPART (NOVOLOG FLEXPEN U-100 INSULIN) 100 unit/mL (3 mL) injection pen Inject 0.22 mL (22 Units total) under the skin Three (3) times a day with a meal. 15 mL 11   ??? insulin detemir U-100 (LEVEMIR) 100 unit/mL (3 mL) injection pen Inject 45 Units total under the skin nightly. 15 mL 3   ??? insulin syringe-needle U-100 0.3 mL 31 gauge x 5/16 Syrg Use to inject insulin twice daily with meals 60 each 0   ??? isosorbide mononitrate (IMDUR) 30 MG 24 hr tablet Take 4 tablets (120 mg total) by mouth daily. 120 tablet 3   ??? lancets Misc Disp. Ins preferred lancets. Use to test blood sugar four times daily. E11.22, Z79.4 400 each 3   ??? loperamide (IMODIUM) 2 mg capsule Take 1 capsule (2 mg total) by mouth 4 (four) times a day as needed for diarrhea. 30 capsule 0   ??? miscellaneous medical supply (BLOOD PRESSURE CUFF) Misc Measure BP once a day or if any symptoms 1 each 0   ??? nitroglycerin (NITROSTAT) 0.4 MG SL tablet Place 1 tablet (0.4 mg total) under the tongue every five (5) minutes as needed for chest pain. 100 each 3   ??? omeprazole (PRILOSEC) 40 MG capsule Take 1 capsule (40 mg total) by mouth Two (2) times a day (30 minutes before a meal). 180 capsule 3   ??? ondansetron (ZOFRAN) 4 MG tablet Take 1 tablet (4 mg total) by mouth every eight (8) hours as needed for nausea for up to 4 days. 10 tablet 0   ??? ondansetron (ZOFRAN) 4 MG tablet Take 1 tablet (4 mg total) by mouth every eight (8) hours as needed for nausea. 30 tablet 0   ??? rivaroxaban (XARELTO) 15 mg Tab Take 1 tablet (15 mg total) by mouth daily with evening meal. 90 tablet 3   ??? semaglutide 0.25 mg or 0.5 mg(2 mg/1.5 mL) PnIj Inject 0.5 mg under the skin every seven (7) days. 4.5 mL 3   ??? sevelamer (RENVELA) 800 mg tablet TAKE 2 TABLETS (1600MG ) BY MOUTH THREE TIMES DAILY WITH MEALS 540 tablet 3   ??? sodium polystyrene, SPS, with sorbitol (SPS, WITH SORBITOL,) 15-20 gram/60 mL Susp Take 60 mL (15 g) by mouth once a week. 473 mL 3   ??? ticagrelor (BRILINTA) 90 mg Tab TAKE 1 TABLET BY MOUTH TWICE DAILY 180 tablet 3   ??? UNIFINE PENTIPS 31 gauge x 5/16 (8 mm) Ndle USE WITH INSULIN AND VICTOZA 5 TIMES DAILY 400 each 3     No current facility-administered medications for this visit.     Allergies   Allergen Reactions   ??? Losartan      Hyperkalemia (K>6)   ??? Opioids - Morphine Analogues Anxiety       Objective:       Physical Exam  BP 179/88  - Pulse 88  - Ht 188 cm (6' 2)  - Wt (!) 171 kg (377 lb)  - SpO2 98%  - BMI 48.40 kg/m??    Wt Readings from Last 12 Encounters:   07/19/19 (!) 171 kg (377 lb)   07/15/19 (!) 173.7 kg (383 lb)   07/14/19 (!) 171.7 kg (378 lb 9.6 oz)   07/14/19 (!) 172.4 kg (380 lb)   07/08/19 (!) 172.6 kg (380 lb 8 oz)   07/06/19 (!) 171.9 kg (378 lb 14.4 oz)   07/04/19 (!) 174.1 kg (383 lb  12.8 oz)   07/04/19 (!) 176.6 kg (389 lb 4.8 oz)   06/29/19 (!) 172.8 kg (380 lb 14.4 oz)   06/29/19 (!) 174.5 kg (384 lb 9.6 oz)   06/27/19 (!) 173.7 kg (383 lb)   06/27/19 (!) 174.8 kg (385 lb 6.4 oz)       General:  Patient is chronically ill-appearing in no acute distress   Eyes:  Intact, sclerae anicteric.   Ears, nose, mouth: Benign   Respiratory:   Normal respiratory effort. Clear to auscultation bilaterally.  There are no wheezes. Cardiovascular:  JVP not seen above the clavicle with HOB at 90 degrees.  Rate and rhythm are regular.  There is no lifts or heaves.  Normal S1, S2. There is no murmur, gallops or rubs, Radial and pedal pulses are 1-2+, bilaterally.   There is 1- 2+ pitting pedal/pretibial edema, bilaterally, stable from prior.   Gastrointestinal:   Soft, non-tender, with audible bowel sounds. Abdomen nondistended.  Liver is nonpalpable.   Musculoskeletal: No joint swelling   Skin: Warm, well perfused.   Neurologic: Appropriate mood and affect. Alert and oriented to person, place, and time. No gross motor or sensory deficits evident.     Recent Labs:  Office Visit on 07/19/2019   Component Date Value Ref Range Status   ??? Magnesium 07/19/2019 1.9  1.6 - 2.6 mg/dL Final   ??? Sodium, POCT 07/19/2019 140  135 - 145 mmol/L Final   ??? Potassium, POCT 07/19/2019 5.9* 3.5 - 5.0 mmol/L Final   ??? Chloride, POCT 07/19/2019 102  98 - 107 mmol/L Final   ??? CO2, POCT 07/19/2019 25  22 - 30 mmol/L Final   ??? BUN, POCT 07/19/2019 54* 7 - 21 mg/dL Final   ??? Creatinine, POCT 07/19/2019 5.8* 0.7 - 1.3 mg/dL Final   ??? Glucose, POCT 07/19/2019 250* 70 - 179 mg/dL Final   ??? Calcium, POCT 07/19/2019 8.3* 8.5 - 10.2 mg/dL Final   Office Visit on 07/14/2019   Component Date Value Ref Range Status   ??? Magnesium 07/14/2019 1.9  1.6 - 2.6 mg/dL Final   ??? PRO-BNP 16/12/9602 5,060.0* 0.0 - 138.0 pg/mL Final   ??? Sodium, POCT 07/14/2019 137  135 - 145 mmol/L Final   ??? Potassium, POCT 07/14/2019 5.5* 3.5 - 5.0 mmol/L Final   ??? Chloride, POCT 07/14/2019 102  98 - 107 mmol/L Final   ??? CO2, POCT 07/14/2019 25  22 - 30 mmol/L Final   ??? BUN, POCT 07/14/2019 51* 7 - 21 mg/dL Final   ??? Creatinine, POCT 07/14/2019 5.6* 0.7 - 1.3 mg/dL Final   ??? Glucose, POCT 07/14/2019 281* 70 - 179 mg/dL Final   ??? Calcium, POCT 07/14/2019 8.5  8.5 - 10.2 mg/dL Final       Lab Results   Component Value Date    PRO-BNP 5,060.0 (H) 07/14/2019    PRO-BNP 4,050.0 (H) 07/08/2019    PRO-BNP 5,610.0 (H) 07/06/2019    PRO-BNP 5,180.0 (H) 07/04/2019    PRO-BNP 3,960.0 (H) 06/29/2019    PRO-BNP 231 (H) 06/08/2014    Creatinine, POCT 5.8 (H) 07/19/2019    Creatinine, POCT 5.6 (H) 07/14/2019    Creatinine, POCT 5.9 (H) 07/08/2019    Creatinine, POCT 5.1 (H) 07/06/2019    Creatinine, POCT 5.0 (H) 07/04/2019    Creatinine 5.92 (H) 07/11/2019    BUN 55 (H) 07/11/2019    BUN, POCT 54 (H) 07/19/2019    BUN, POCT 51 (H) 07/14/2019    BUN,  POCT 51 (H) 07/08/2019    BUN, POCT 44 (H) 07/06/2019    BUN, POCT 44 (H) 07/04/2019    Magnesium 1.9 07/19/2019    Magnesium 1.9 07/14/2019    Magnesium 1.8 07/08/2019    Magnesium 1.8 07/06/2019    Magnesium 1.9 07/04/2019    Magnesium 2.0 06/09/2014    Magnesium 1.7 05/12/2011    Magnesium 1.8 01/09/2011       Metric Tracker:  Did today's visit result in ED visit? No  Did today's visit result in hospital admission? No  Did today's visit result in referral to cardiology? n/a  Today's visit was a referral from Nephrology

## 2019-07-19 NOTE — Unmapped (Signed)
IV Diuresis Clinic Discharge Instructions:    MEDICATIONS:  NO medication changes today.    Call if you have questions about your medications.    IMPORTANT INSTRUCTIONS:  - Weigh yourself daily in the morning; write your weight down and bring it with you next time   - Limit your fluid intake to 2 Liters (half-gallon) per day    - Limit your salt intake to 2-3 grams (2000-3000 mg) per day  - If you have a morning diuresis appointment, don't take your morning diuretic  - If you have an afternoon diuresis appointment, take your morning diuretic    LABS:  We will call you if your labs need attention    NEXT APPOINTMENT:  Return Thursday at 9am, if you are not admitted by then    The Cardiology Clinic has moved. Our new address is [address] [place], [place] [address], 2nd floor.     My office number (c/o De Burrs RN) is [phone] if you need further assistance, or need to schedule with our IV Diuresis clinic in the future.    If you need to reschedule future appointments, please call [phone] or [phone]. After office hours, if you have urgent questions/problems, contact the on-call cardiologist through the hospital operator: [phone].

## 2019-07-20 ENCOUNTER — Ambulatory Visit: Admit: 2019-07-20 | Discharge: 2019-07-21 | Payer: MEDICARE | Attending: Adult Health | Primary: Adult Health

## 2019-07-20 ENCOUNTER — Ambulatory Visit: Admit: 2019-07-20 | Discharge: 2019-07-21 | Payer: MEDICARE

## 2019-07-20 DIAGNOSIS — I5042 Chronic combined systolic (congestive) and diastolic (congestive) heart failure: Principal | ICD-10-CM

## 2019-07-20 LAB — MAGNESIUM: Magnesium:MCnc:Pt:Ser/Plas:Qn:: 1.7

## 2019-07-20 LAB — PRO-BNP: Natriuretic peptide.B prohormone N-Terminal:MCnc:Pt:Ser/Plas:Qn:: 6530 — ABNORMAL HIGH

## 2019-07-20 MED FILL — PRALUENT PEN 150 MG/ML SUBCUTANEOUS PEN INJECTOR: 84 days supply | Qty: 6 | Fill #0 | Status: AC

## 2019-07-20 NOTE — Unmapped (Signed)
IV Diuresis Clinic Discharge Instructions:    MEDICATIONS:  NO medication changes today.    Call if you have questions about your medications.    IMPORTANT INSTRUCTIONS:  - Weigh yourself daily in the morning; write your weight down and bring it with you next time   - Limit your fluid intake to 2 Liters (half-gallon) per day    - Limit your salt intake to 2-3 grams (2000-3000 mg) per day  - If you have a morning diuresis appointment, don't take your morning diuretic  - If you have an afternoon diuresis appointment, take your morning diuretic    LABS:  We will call you if your labs need attention    NEXT APPOINTMENT:  Return to clinic in 2 days with IV Diuresis - Friday at [facility]    The Cardiology Clinic has moved. Our new address is [address] [place], [place] [address], 2nd floor.     My office number (c/o De Burrs RN) is [phone] if you need further assistance, or need to schedule with our IV Diuresis clinic in the future.    If you need to reschedule future appointments, please call [phone] or [phone]. After office hours, if you have urgent questions/problems, contact the on-call cardiologist through the hospital operator: [phone].

## 2019-07-20 NOTE — Unmapped (Signed)
[place] IV Diuresis Clinic Note    Primary Cardiologist/Cardiology Provider(s):  Dr. Hoy Finlay  PCP:  Kurtis Bushman, MD    Last Cardiology Clinic Visit:  07/14/2019  Last IV Diuresis Visit: 07/14/2019    Reason for Visit:  Jeremy Oliver is a 49 y.o. male with a history of combined systolic and diastolic heart failure, who is being seen today in the [facility] IV Diuresis Clinic for an urgent visit for volume status optimization including IV diuresis.    Assessment/Plan:  1. Acute on Chronic combined systolic and diastolic heart failure, acute on chronic kidney disease  - Volume status today is improved. 388 --> 379 --> 380.5 --> 380 --> 383 -->373.8  - NYHA Class II-III  - IV Lasix 160 mg x1  was administered during clinic with good response  - Labs were obtained: Cr 5.9, K 5.0  - his living situation is likely contributing to his inability to be compliant with diet and weight monitoring; just recently moved into housing  - continue Bumex 5 mg twice daily. Continue to work on reducing sodium.   - Plan for direct admission; return Friday 07/22/2019 to diuresis clinic if no bed    Response to IV diuresis:         Medications Heart rate Blood pressure Weight (lbs)   Pre  77 116/65 373.8   Hour 0 IV Lasix 160 mg      Hour 2 IV Lasix 0 mg      Post         Output:  I/O     None        Return in about 2 days (around 07/22/2019) for Diuresis Clinic.      Future Appointments   Date Time Provider Department Center   07/22/2019 11:00 AM DIURESIS HF NP [place] [facility]   07/26/2019  8:15 AM [place] REMOTE MONITORING EPM[facility]   08/02/2019 12:00 PM Pallav Carlean Purl, MD NEUR [facility]   08/03/2019  8:20 AM Roma Schanz, MD [facility]   08/03/2019  9:00 AM Mila Merry, MD Lyndal Rainbow [facility]   01/16/2020  8:30 AM Monica Martinez, MD UNCR[facility]PEC[facility]       History of Present Illness:  Jeremy Oliver is a 49 y.o. male with a history of combined systolic and diastolic heart failure who presents today for IV diuresis. He also has a history of significant CAD. Patient last seen in clinic on 06/29/2019. Since that time, he has gained the 4 pounds he lost last week during IV diuresis and gained an additional 2 pounds.  He was seen in the IV diuresis clinic on 06/29/2019 and was given furosemide 160 mg IV x2.  He had a good response despite his chronic kidney disease.  He had an improvement in symptoms.      He was seen in follow-up 07/04/19. He noted more orthopnea over the past 2 nights despite being on BiPAP.  He was seen by Dr. Cherly Hensen in nephrology that morning and was referred back to IV diuresis.  He had generally not been weighing at home and endorsed shortness of breath with walking short distances.  He was able to walk from his car in the parking deck to the clinic lobby but needed a wheelchair to get the rest of the way into the clinic.  He had lower extremity edema to his knee.  He was taking Bumex 5 mg twice daily. Over  the weekend he ate a slice of pizza that was supreme as well as have a salad with ranch dressing.  He is drinking several sodas.  He did not start hydralazine as recommended last week. He had good rsponse to IV Lasix 160mg  x1.     He was seen 4/7 for follow-up. His weight was down. He said he felt pretty good - was able to walk into the building. He was urinating a lot with the Bumex.  He was instructed to take metolazone 2.5mg  x1 that morning.     He was last seen 4/9 and felt about the same as he did at his previous visit. His breathing was fine; he was able to walk into clinic today without much difficulty. He and his wife found housing, put a deposit down w/ plans to move in the following weekend. He was not given IV Lasix due to stability of weights and worsening Cr to 5.9 after taking metolazone 2.5mg  x1. He was instructed not to repeat metolazone over the weekend.    He had repeat labs which showed Cr 5.92 and K 6.3. he was instructed to take SPS x3 days in a row and scheduled for f/u in Diuresis Clinic. He discussed dialysis w/ his nephrologist but stated he really wanted to wait to start until he was in stable housing. Last seen 4/15 and weight was stable and he felt about the same as last visit. His LE edema was stable. Breathing was OK, he was able to walk in without much trouble. He was sleeping fine without orthopnea. He had tried to make some diet changes; when they had chinese food, he only used 1 packet of soy sauce instead of several. He was diuresed with IV Lasix 160mg  x1.    07/19/2019 he presents for follow-up. He has been planning direct admission w/ his nephrologist to start dialysis, but there is no open bed yet. He feels his LE edema has improved. He was again able to walk in without a wheelchair. He isn't sleeping well, feels like he is drowning when he lays back. He was previously sleeping on a couch and now that he has moved into housing, he is sleeping in a bed. He felt better when he laid on his side.     Today he presents for follow-up of diuresis clinic visit yesterday. Weight down ~3 lbs, to 373.8 lbs from 377 lbs yesterday. Patient reports feeling better and states that he had great output throughout the evening. However, he was unable to walk into clinic today due to leg weakness and needed his wife to push him in a wheelchair to clinic today.  Pt endorses breathing better than yesterday and was able to sleep some last night on his side. He had a decreased appetite and ate half a bowl of raisin bran for dinner.    Past Medical History:   Diagnosis Date   ??? Angina at rest (CMS-[name])    ??? Arthritis    ??? Asthma    ??? Atrial fibrillation and flutter (CMS-[name]) 06/08/14   ??? BMI 40.0-44.9, adult (CMS-[name]) 10/29/2015   ??? Chronic anticoagulation 02/17/2018   ??? Chronic kidney disease, stage 3    ??? Chronic pain syndrome 04/11/2014    ~Use daily lyrica as recommended ~Tramadol as needed for pain ~Fluoxetine daily as recommended ~Daily exercise ~Eat a healthy diet ~Good control of your blood sugars can help    ??? Coronary artery disease    ??? Depression    ???  Diabetes mellitus (CMS-[name])    ??? Eye trauma 1998    glass in both eyes and removed   ??? GERD (gastroesophageal reflux disease)    ??? Gout    ??? Homeless    ??? Hyperlipidemia    ??? Hypertension    ??? Illiteracy and low-level literacy    ??? Microscopic hematuria    ??? Migraine    ??? Nephrolithiasis    ??? 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 Nonalcoholic fatty liver disease    ??? NSTEMI (non-ST elevated myocardial infarction) (CMS-[name]) 01/13/2017   ??? Obesity    ??? Obstructive sleep apnea 2009   ??? Panic attacks    ??? 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 Seizure-like activity (CMS-[name])     ~Monitor for symptoms and report them to your primary care provider if they occur    ??? Systolic CHF, chronic (CMS-[name])     EF 40-45% 2017   ??? TIA (transient ischemic attack)      Past Surgical History:   Procedure Laterality Date   ??? CARDIAC CATHETERIZATION  03/08/2007   ??? CORONARY STENT PLACEMENT     ??? CYSTOURETHROSCOPY  12/31/2009     Microscopic hematuria   ??? KNEE SURGERY Right 09/23/2006    arthroscopic knee surgery medial and lateral meniscus tears as well as crystal disease   ??? PR CATH PLACE/CORON ANGIO, IMG SUPER/INTERP,R&L HRT CATH, L HRT VENTRIC N/A 02/13/2017    Procedure: Left/Right Heart Catheterization;  Surgeon: Marlaine Hind, MD;  Location: [facility] CATH;  Service: Cardiology   ??? PR CATH PLACE/CORON ANGIO, IMG SUPER/INTERP,W LEFT HEART VENTRICULOGRAPHY N/A 03/17/2017    Procedure: Left Heart Catheterization W Intervention;  Surgeon: Marlaine Hind, MD;  Location: [facility] CATH;  Service: Cardiology   ??? PR CATH PLACE/CORON ANGIO, IMG SUPER/INTERP,W LEFT HEART VENTRICULOGRAPHY N/A 05/12/2018    Procedure: CATH LEFT HEART CATHETERIZATION W INTERVENTION;  Surgeon: Dorathy Kinsman, MD;  Location: [facility] CATH;  Service: Cardiology   ??? PR CATH PLACE/CORON ANGIO, IMG SUPER/INTERP,W LEFT HEART VENTRICULOGRAPHY N/A 10/22/2018    Procedure: CATH LEFT HEART CATHETERIZATION W INTERVENTION;  Surgeon: Marlaine Hind, MD;  Location: [facility] CATH;  Service: Cardiology   ??? PR ECMO/ECLS INITIATION VENO-ARTERIAL N/A 03/19/2017    Procedure: ECMO/ECLS; INITIATION, VENO-ARTERIAL;  Surgeon: Lennie Odor, MD;  Location: MAIN OR [facility];  Service: Cardiac Surgery   ??? PR ECMO/ECLS RMVL PRPH CANNULA OPEN 6 YRS & OLDER Left 03/25/2017    Procedure: ECMO / ECLS PROVIDED BY PHYSICIAN; REMOVAL OF PERIPHERAL (ARTERIAL AND/OR VENOUS) CANNULA(E), OPEN, 6 YEARS AND OLDER;  Surgeon: Alonna Buckler Ikonomidis, MD;  Location: MAIN OR [facility];  Service: Cardiac Surgery   ??? PR EPHYS EVAL W/ ABLATION SUPRAVENT ARRHYTHMIA N/A 07/19/2015    Catheter ablation of cavotricuspid isthmus for atrial flutter [facility])   ??? PR INSER HART PACER XVENOUS ATRIAL N/A 05/30/2015    [employer] dual-chamber pacemaker implant (E.Chung)   ??? PR INSERT/PLACE FLOW DIRECT CATH N/A 03/17/2017    Procedure: Insert Leave In [place];  Surgeon: Marlaine Hind, MD;  Location:  [facility] CATH;  Service: Cardiology   ??? PR INSJ/RPLCMT PERM DFB W/TRNSVNS LDS 1/DUAL CHMBR N/A 05/04/2017    Procedure: ICD Implant System (Single/Dual);  Surgeon: Eldred Manges, MD;  Location: [facility] CATH;  Service: Cardiology   ??? PR NEGATIVE PRESSURE WOUND THERAPY DME >50 SQ CM Left 03/25/2017    Procedure: Neg Press Wound Tx (Vac Assist) Incl Topicals, Per Session, Tsa Greater Than/= 50 Cm Squared;  Surgeon: Alonna Buckler Ikonomidis, MD;  Location: MAIN OR [facility];  Service: Cardiac Surgery   ??? PR PRQ TRLUML CORONARY STENT W/ANGIO ONE ART/BRNCH N/A 03/12/2018    Procedure: Percutaneous Coronary Intervention;  Surgeon: Marlaine Hind, MD;  Location: [facility] CATH;  Service: Cardiology   ??? PR REBL VES DIRECT,LOW EXTREM Left 03/19/2017    Procedure: Repr Bld Vessel Direct; Lower Extrem;  Surgeon: Boykin Reaper, MD;  Location: MAIN OR [facility];  Service: Vascular   ??? PR REMV ART CLOT ILIAC-POP,LEG INCIS Left 03/25/2017    Procedure: EMBOLECTOMY OR THROMBECTOMY, WITH OR WITHOUT CATHETER; FEMOROPOPLITEAL, AORTOILIAC ARTERY, BY LEG INCISION;  Surgeon: Alonna Buckler Ikonomidis, MD;  Location: MAIN OR [facility];  Service: Cardiac Surgery   ??? PR UPPER GI ENDOSCOPY,BIOPSY N/A 02/28/2016    Procedure: UGI ENDOSCOPY; WITH BIOPSY, SINGLE OR MULTIPLE;  Surgeon: Liane Comber, MD;  [facility]: [facility];  Service: Gastroenterology   ??? PR UPPER GI ENDOSCOPY,DIAGNOSIS N/A 05/07/2016    Procedure: UGI ENDO, INCLUDE ESOPHAGUS, STOMACH, & DUODENUM &/OR JEJUNUM; DX W/WO COLLECTION SPECIMN, BY BRUSH OR WASH;  Surgeon: Modena Nunnery, MD;  Location: [facility];  Service: Gastroenterology       Current Outpatient Medications   Medication Sig Dispense Refill   ??? acetaminophen (TYLENOL) 500 MG tablet TAKE 2 TABLETS (1 000MG ) BY MOUTH EVERY 8 HOURS AS NEEDED FOR PAIN 180 each 3   ??? albuterol HFA 90 mcg/actuation inhaler Inhale 2 puffs into the lungs every 6 (six) hours as needed for wheezing or shortness of breath. 18 g 2   ??? alirocumab (PRALUENT) 150 mg/mL subcutaneous injection Inject the contents of 1 pen (150 mg total) under the skin every fourteen (14) days. 6 mL 3   ??? allopurinoL (ZYLOPRIM) 100 MG tablet Take 2 tablets (200 mg total) by mouth daily. 60 tablet 5   ??? amiodarone (PACERONE) 200 MG tablet TAKE 1 TABLET BY MOUTH ONCE DAILY 90 tablet 3   ??? amLODIPine (NORVASC) 10 MG tablet Take 1 tablet (10 mg total) by mouth daily. 90 tablet 3   ??? atorvastatin (LIPITOR) 80 MG tablet Take 1 tablet (80 mg total) by mouth daily. 90 tablet 3   ??? bumetanide (BUMEX) 2 MG tablet Take 2.5 tablets (5 mg total) by mouth two (2) times a day. 450 tablet 3   ??? carvediloL (COREG) 25 MG tablet Take 1.5 tablets (37.5 mg total) by mouth Two (2) times a day. 270 tablet 3   ??? diclofenac sodium (VOLTAREN) 1 % gel Apply 2 grams topically Three (3) times a day. 180 g 11   ??? ezetimibe (ZETIA) 10 mg tablet TAKE 1 TABLET BY MOUTH ONCE DAILY 90 each 3   ??? FLUoxetine (PROZAC) 40 MG capsule Take 2 capsules (80 mg total) by mouth daily. 180 capsule 3   ??? gabapentin (NEURONTIN) 300 MG capsule Take 1 capsule (300 mg total) by mouth two (2) times a day. 180 capsule 3   ??? hydrOXYzine (ATARAX) 10 MG tablet Take 1 tablet (10 mg total) by mouth nightly as  needed for anxiety. 30 tablet 0   ??? insulin ASPART (NOVOLOG FLEXPEN U-100 INSULIN) 100 unit/mL (3 mL) injection pen Inject 0.22 mL (22 Units total) under the skin Three (3) times a day with a meal. 15 mL 11   ??? isosorbide mononitrate (IMDUR) 30 MG 24 hr tablet Take 4 tablets (120 mg total) by mouth daily. 120 tablet 3   ??? loperamide (IMODIUM) 2 mg capsule Take 1 capsule (2 mg total) by mouth 4 (four) times a day as needed for diarrhea. 30 capsule 0   ??? omeprazole (PRILOSEC) 40 MG capsule Take 1 capsule (40 mg total) by mouth Two (2) times a day (30 minutes before a meal). 180 capsule 3   ??? ondansetron (ZOFRAN) 4 MG tablet Take 1 tablet (4 mg total) by mouth every eight (8) hours as needed for nausea for up to 4 days. 10 tablet 0   ??? rivaroxaban (XARELTO) 15 mg Tab Take 1 tablet (15 mg total) by mouth daily with evening meal. 90 tablet 3   ??? semaglutide 0.25 mg or 0.5 mg(2 mg/1.5 mL) PnIj Inject 0.5 mg under the skin every seven (7) days. 4.5 mL 3   ??? sevelamer (RENVELA) 800 mg tablet TAKE 2 TABLETS (1600MG ) BY MOUTH THREE TIMES DAILY WITH MEALS 540 tablet 3   ??? ticagrelor (BRILINTA) 90 mg Tab TAKE 1 TABLET BY MOUTH TWICE DAILY 180 tablet 3   ??? blood sugar diagnostic Strp Disp to match current ins approved meter. Use to test blood sugar four times daily. E11.22, Z79.4 400 each 3   ??? blood-glucose meter kit Disp. blood glucose meter kit preferred by patient's insurance. Dx: Diabetes, E11.9 1 each 11   ??? insulin detemir U-100 (LEVEMIR) 100 unit/mL (3 mL) injection pen Inject 45 Units total under the skin nightly. 15 mL 3   ??? insulin syringe-needle U-100 0.3 mL 31 gauge x 5/16 Syrg Use to inject insulin twice daily with meals 60 each 0   ??? lancets Misc Disp. Ins preferred lancets. Use to test blood sugar four times daily. E11.22, Z79.4 400 each 3   ??? miscellaneous medical supply (BLOOD PRESSURE CUFF) Misc Measure BP once a day or if any symptoms 1 each 0   ??? nitroglycerin (NITROSTAT) 0.4 MG SL tablet Place 1 tablet (0.4 mg total) under the tongue every five (5) minutes as needed for chest pain. (Patient not taking: Reported on 07/20/2019) 100 each 3   ??? ondansetron (ZOFRAN) 4 MG tablet Take 1 tablet (4 mg total) by mouth every eight (8) hours as needed for nausea. 30 tablet 0   ??? sodium polystyrene, SPS, with sorbitol (SPS, WITH SORBITOL,) 15-20 gram/60 mL Susp Take 60 mL (15 g) by mouth once a week. (Patient not taking: Reported on 07/20/2019) 473 mL 3   ??? UNIFINE PENTIPS 31 gauge x 5/16 (8 mm) Ndle USE WITH INSULIN AND VICTOZA 5 TIMES DAILY 400 each 3     No current facility-administered medications for this visit.     Allergies   Allergen Reactions   ??? Losartan      Hyperkalemia (K>6)   ??? Opioids - Morphine Analogues Anxiety       Objective:       Physical Exam  BP 116/65  - Pulse 77  - Temp 36.1 ??C (97 ??F)  - Resp 22  - Ht 188 cm (6' 2)  - Wt (!) 169.6 kg (373 lb 12.8 oz) Comment: corrected weight - SpO2 97%  - BMI 47.99  kg/m??    Wt Readings from Last 12 Encounters:   07/20/19 (!) 169.6 kg (373 lb 12.8 oz)   07/19/19 (!) 171 kg (377 lb)   07/15/19 (!) 173.7 kg (383 lb)   07/14/19 (!) 171.7 kg (378 lb 9.6 oz)   07/14/19 (!) 172.4 kg (380 lb)   07/08/19 (!) 172.6 kg (380 lb 8 oz)   07/06/19 (!) 171.9 kg (378 lb 14.4 oz)   07/04/19 (!) 174.1 kg (383 lb 12.8 oz)   07/04/19 (!) 176.6 kg (389 lb 4.8 oz)   06/29/19 (!) 172.8 kg (380 lb 14.4 oz)   06/29/19 (!) 174.5 kg (384 lb 9.6 oz)   06/27/19 (!) 173.7 kg (383 lb)       General:  Patient is chronically ill-appearing in no acute distress   Eyes:  Intact, sclerae anicteric.   Ears, nose, mouth: Benign   Respiratory:   Normal respiratory effort. Clear to auscultation bilaterally.  There are no wheezes.   Cardiovascular:  JVP not seen above the clavicle with HOB at 90 degrees.  Rate and rhythm are regular.  There is no lifts or heaves.  Normal S1, S2. There is no murmur, gallops or rubs, Radial and pedal pulses are 1-2+, bilaterally.   There is 1- 2+ pitting pedal/pretibial edema, bilaterally, stable from prior.   Gastrointestinal:   Soft, non-tender, with audible bowel sounds. Abdomen nondistended.  Liver is nonpalpable.   Musculoskeletal: No joint swelling   Skin: Warm, well perfused.   Neurologic: Appropriate mood and affect. Alert and oriented to person, place, and time. No gross motor or sensory deficits evident.     Recent Labs:  Office Visit on 07/20/2019   Component Date Value Ref Range Status   ??? Magnesium 07/20/2019 1.7  1.6 - 2.6 mg/dL Final   ??? PRO-BNP 16/12/9602 6,530.0* 0.0 - 138.0 pg/mL Final   ??? Sodium, POCT 07/20/2019 134* 135 - 145 mmol/L Final   ??? Potassium, POCT 07/20/2019 5.0  3.5 - 5.0 mmol/L Final   ??? Chloride, POCT 07/20/2019 98  98 - 107 mmol/L Final   ??? CO2, POCT 07/20/2019 24  22 - 30 mmol/L Final   ??? BUN, POCT 07/20/2019 55* 7 - 21 mg/dL Final   ??? Creatinine, POCT 07/20/2019 5.9* 0.7 - 1.3 mg/dL Final   ??? Glucose, POCT 07/20/2019 214* 70 - 179 mg/dL Final   ??? Calcium, POCT 07/20/2019 8.2* 8.5 - 10.2 mg/dL Final   Office Visit on 07/19/2019   Component Date Value Ref Range Status   ??? Magnesium 07/19/2019 1.9  1.6 - 2.6 mg/dL Final   ??? Sodium, POCT 07/19/2019 140  135 - 145 mmol/L Final   ??? Potassium, POCT 07/19/2019 5.9* 3.5 - 5.0 mmol/L Final   ??? Chloride, POCT 07/19/2019 102  98 - 107 mmol/L Final   ??? CO2, POCT 07/19/2019 25  22 - 30 mmol/L Final   ??? BUN, POCT 07/19/2019 54* 7 - 21 mg/dL Final   ??? Creatinine, POCT 07/19/2019 5.8* 0.7 - 1.3 mg/dL Final   ??? Glucose, POCT 07/19/2019 250* 70 - 179 mg/dL Final   ??? Calcium, POCT 07/19/2019 8.3* 8.5 - 10.2 mg/dL Final   Office Visit on 07/14/2019   Component Date Value Ref Range Status   ??? Magnesium 07/14/2019 1.9  1.6 - 2.6 mg/dL Final   ??? PRO-BNP 54/11/8117 5,060.0* 0.0 - 138.0 pg/mL Final   ??? Sodium, POCT 07/14/2019 137  135 - 145 mmol/L Final   ??? Potassium, POCT 07/14/2019  5.5* 3.5 - 5.0 mmol/L Final   ??? Chloride, POCT 07/14/2019 102  98 - 107 mmol/L Final   ??? CO2, POCT 07/14/2019 25  22 - 30 mmol/L Final   ??? BUN, POCT 07/14/2019 51* 7 - 21 mg/dL Final   ??? Creatinine, POCT 07/14/2019 5.6* 0.7 - 1.3 mg/dL Final   ??? Glucose, POCT 07/14/2019 281* 70 - 179 mg/dL Final   ??? Calcium, POCT 07/14/2019 8.5  8.5 - 10.2 mg/dL Final       Lab Results   Component Value Date    PRO-BNP 6,530.0 (H) 07/20/2019    PRO-BNP 5,060.0 (H) 07/14/2019    PRO-BNP 4,050.0 (H) 07/08/2019    PRO-BNP 5,610.0 (H) 07/06/2019    PRO-BNP 5,180.0 (H) 07/04/2019    PRO-BNP 231 (H) 06/08/2014    Creatinine, POCT 5.9 (H) 07/20/2019    Creatinine, POCT 5.8 (H) 07/19/2019    Creatinine, POCT 5.6 (H) 07/14/2019    Creatinine, POCT 5.9 (H) 07/08/2019    Creatinine, POCT 5.1 (H) 07/06/2019    Creatinine 5.92 (H) 07/11/2019    BUN 55 (H) 07/11/2019    BUN, POCT 55 (H) 07/20/2019    BUN, POCT 54 (H) 07/19/2019    BUN, POCT 51 (H) 07/14/2019    BUN, POCT 51 (H) 07/08/2019    BUN, POCT 44 (H) 07/06/2019    Magnesium 1.7 07/20/2019    Magnesium 1.9 07/19/2019    Magnesium 1.9 07/14/2019    Magnesium 1.8 07/08/2019    Magnesium 1.8 07/06/2019    Magnesium 2.0 06/09/2014    Magnesium 1.7 05/12/2011    Magnesium 1.8 01/09/2011       Metric Tracker:  Did today's visit result in ED visit? No  Did today's visit result in hospital admission? No Did today's visit result in referral to cardiology? n/a  Today's visit was a referral from Nephrology

## 2019-07-20 NOTE — Unmapped (Signed)
Kidney Palliative Care Consult Note  Follow Up Visit    Reason for Consult Request:  Evaluation of Kidney Palliative Care Clinic Follow Up; symptoms, support, GOC  Primary Care Provider:  Kurtis Bushman, MD  Referring physician: Dr. Gwenith Spitz, nephrology    Assessment/Plan:   [name] is a 49 y.o. male with CKD stage 5, type 2 diabetes mellitus, HTN, OSA, atrial fibrillation, pacemaker, chronic pain, neuropathy and obesity. He presents today for new patient visit to [facility] for assistance with goals of care related to his kidney disease treatment options, symptom assessment/recommendations and support.     [name] presents to Jeremy Oliver today with his wife Jeremy Oliver. They anticipate he will be admitted in the coming days to initiate dialysis. [name] and his wife wished to discuss next steps and anticipate his course as much as possible. [name] also wished to continue GOC discussion with his wife present as he has strong preferences about his care, especially if he were to be in an unrecoverable state or dependent upon machines for prolongation of life.     Symptom Recommendations:  No new recommendations today. Will CTM  ??  Goals of care:  -Understanding of medical condition and prognosis  [name] clearly verbalizes understanding that he has a number of life limiting medical conditions. He has anticipated that his cardiac issues would ultimately cause his kidney disease to worsen to the point he has to pursue dialysis. He continues to wonder if dialysis will or won't ultimately help him to live an acceptable quality life but he is willing to try.   ??  -Goals and Priorities  He hopes to have more time to spend with his family and his dog. He loves his dog so much and looks forward to opening his eyes to see her every day. He has also had hopes to return to fishing and hunting but now thinks this Jeremy Oliver not be a possibility. He is also hopeful to travel to [place] to see his daughter. ??  -Treatment Decisions  --[name] remains FULL CODE but has previously shared that he considered foregoing aggressive resuscitation efforts. He shared that his wife and daughter were not in favor of foregoing aggressive measures and as such he has decided to accept full code.   --[name] is very clear and continues to repeat in the presence of his wife that he does NOT want to be prolonged in a vegetative state or in a state of suffering. Do not prolong dying process. He states he would not wish to live his life dependent upon machines and unable to interact with his surroundings.   Jeremy Oliver voices concern about this and states she will not pull the plug that their daughter would also not let her do that. [name] has stated in response that he Jeremy Oliver consider writing down these preferences to ensure his preferences are carried out.     --He anticipates starting dialysis very soon, in days. He admits the more time that passes he begins to wonder if the start of his treatment is delayed for a reason; perhaps to give him more time to reconsider. He admits he has thought about not starting dialysis but in discussion with his family he remains intent on trying it. He expresses hope to watch his grandchild grow up and to be able to contribute to his life.     Health Care Decision Maker as of 06/02/2019    Primary [name]: Jeremy Oliver -  Spouse - [phone]    Jeremy Oliver [name]: Jeremy Oliver - Daughter - [phone]  Living Will: Completed and on file 02/06/2017  ??  Psychosocial and Caregiver Support:  [name] expresses a sense of relief that providers can also emotionally support his wife as he anticipates she Balli need support of her own, especially if things do not work out as well as they are hoping.     Spiritual Support:  [name] states he prays to God to have more time to watch his grandchild grow up. He also shares his belief that God will take him when it is time to go and he does not want that plan interrupted at the hands of medical intervention. He expresses gratitude to God that he has lived the life he has, that he has repaired and sustained his relationship with his wife and that he has a daughter and grandchild which mean so much to him.    F/u: 1 month or sooner as needed. I will also follow him in [place] once he is admitted to initiate dialysis.     ----------------------------------------  HPI: [name] and I last visited in February. At that time he was able to walk in to clinic without assistive device. Although he was feeling worn out he was still managing to do some work on his car, get out and about to eat with family. Today he comes in by wheelchair, pushed by his wife Jeremy Oliver. He appears so have declined significantly from last visit, very pale, fatigued and generally less spirited than usual. He reports massive swelling in his lower extremities which has been worse in the last month requiring frequent visits to the diuresis clinic. He has been unable to sleep due to shortness of breath. His appetite is minimal. He has been nauseated, occasionally vomiting. He is very tearful today and quite fearful about what the days and weeks ahead Towle bring. He continues to have chest pain on/off but has not required any nitroglycerin lately. He has recently started to feel just enough improvement in the last 24 hours that he was able to sleep in bed with his wife.     Symptom Ratings:  Pain= currently denies  Dyspnea/Secretions= moderate dyspnea which has slightly improved in the last 24 hours after aggressive diuresis. He was able to sleep in bed last night although propped up on a number of pillow. No secretions  Poor Appetite= moderate to severe loss of appetite  Nausea= moderate ongoing waxing/waning nausea  Constipation= denies  Depression= did not address today  Anxiety= anxiety when short of breath  Tiredness/Fatigue= acute on chronic, severe  Psychosocial Distress= remains very anxious about dialysis and outcomes related to this treatment or cardiac intervention. Expressed concern that he could pass away, that his time Else be coming.  Spiritual Distress= denies. States he is comforted by his faith, that God will take him when its time.     REVIEW OF SYSTEMS:  Constitutional: positive for anorexia and fatigue  Respiratory: positive for dyspnea on exertion  Gastrointestinal: positive for diarrhea, dyspepsia, nausea and vomiting  Genitourinary:positive for sexual problems, decreased stream and hesitancy    How would you rate your overall quality of life during the past week? (1=Very Poor; 7=Excellent)  Number: 1    Medications prior to visit:   Current Outpatient Medications   Medication Sig Dispense Refill   ??? acetaminophen (TYLENOL) 500 MG tablet TAKE 2 TABLETS (1 000MG ) BY MOUTH EVERY 8 HOURS AS NEEDED FOR PAIN 180  each 3   ??? albuterol HFA 90 mcg/actuation inhaler Inhale 2 puffs into the lungs every 6 (six) hours as needed for wheezing or shortness of breath. 18 g 2   ??? alirocumab (PRALUENT) 150 mg/mL subcutaneous injection Inject the contents of 1 pen (150 mg total) under the skin every fourteen (14) days. 6 mL 3   ??? allopurinoL (ZYLOPRIM) 100 MG tablet Take 2 tablets (200 mg total) by mouth daily. 60 tablet 5   ??? amiodarone (PACERONE) 200 MG tablet TAKE 1 TABLET BY MOUTH ONCE DAILY 90 tablet 3   ??? amLODIPine (NORVASC) 10 MG tablet Take 1 tablet (10 mg total) by mouth daily. 90 tablet 3   ??? atorvastatin (LIPITOR) 80 MG tablet Take 1 tablet (80 mg total) by mouth daily. 90 tablet 3   ??? blood sugar diagnostic Strp Disp to match current ins approved meter. Use to test blood sugar four times daily. E11.22, Z79.4 400 each 3   ??? blood-glucose meter kit Disp. blood glucose meter kit preferred by patient's insurance. Dx: Diabetes, E11.9 1 each 11   ??? bumetanide (BUMEX) 2 MG tablet Take 2.5 tablets (5 mg total) by mouth two (2) times a day. 450 tablet 3   ??? carvediloL (COREG) 25 MG tablet Take 1.5 tablets (37.5 mg total) by mouth Two (2) times a day. 270 tablet 3   ??? diclofenac sodium (VOLTAREN) 1 % gel Apply 2 grams topically Three (3) times a day. 180 g 11   ??? ezetimibe (ZETIA) 10 mg tablet TAKE 1 TABLET BY MOUTH ONCE DAILY 90 each 3   ??? FLUoxetine (PROZAC) 40 MG capsule Take 2 capsules (80 mg total) by mouth daily. 180 capsule 3   ??? gabapentin (NEURONTIN) 300 MG capsule Take 1 capsule (300 mg total) by mouth two (2) times a day. 180 capsule 3   ??? hydrOXYzine (ATARAX) 10 MG tablet Take 1 tablet (10 mg total) by mouth nightly as needed for anxiety. 30 tablet 0   ??? insulin ASPART (NOVOLOG FLEXPEN U-100 INSULIN) 100 unit/mL (3 mL) injection pen Inject 0.22 mL (22 Units total) under the skin Three (3) times a day with a meal. 15 mL 11   ??? insulin detemir U-100 (LEVEMIR) 100 unit/mL (3 mL) injection pen Inject 45 Units total under the skin nightly. 15 mL 3   ??? insulin syringe-needle U-100 0.3 mL 31 gauge x 5/16 Syrg Use to inject insulin twice daily with meals 60 each 0   ??? isosorbide mononitrate (IMDUR) 30 MG 24 hr tablet Take 4 tablets (120 mg total) by mouth daily. 120 tablet 3   ??? lancets Misc Disp. Ins preferred lancets. Use to test blood sugar four times daily. E11.22, Z79.4 400 each 3   ??? loperamide (IMODIUM) 2 mg capsule Take 1 capsule (2 mg total) by mouth 4 (four) times a day as needed for diarrhea. 30 capsule 0   ??? miscellaneous medical supply (BLOOD PRESSURE CUFF) Misc Measure BP once a day or if any symptoms 1 each 0   ??? nitroglycerin (NITROSTAT) 0.4 MG SL tablet Place 1 tablet (0.4 mg total) under the tongue every five (5) minutes as needed for chest pain. (Patient not taking: Reported on 07/20/2019) 100 each 3   ??? omeprazole (PRILOSEC) 40 MG capsule Take 1 capsule (40 mg total) by mouth Two (2) times a day (30 minutes before a meal). 180 capsule 3   ??? ondansetron (ZOFRAN) 4 MG tablet Take 1 tablet (4 mg total) by mouth every  eight (8) hours as needed for nausea for up to 4 days. 10 tablet 0   ??? ondansetron (ZOFRAN) 4 MG tablet Take 1 tablet (4 mg total) by mouth every eight (8) hours as needed for nausea. 30 tablet 0   ??? rivaroxaban (XARELTO) 15 mg Tab Take 1 tablet (15 mg total) by mouth daily with evening meal. 90 tablet 3   ??? semaglutide 0.25 mg or 0.5 mg(2 mg/1.5 mL) PnIj Inject 0.5 mg under the skin every seven (7) days. 4.5 mL 3   ??? sevelamer (RENVELA) 800 mg tablet TAKE 2 TABLETS (1600MG ) BY MOUTH THREE TIMES DAILY WITH MEALS 540 tablet 3   ??? sodium polystyrene, SPS, with sorbitol (SPS, WITH SORBITOL,) 15-20 gram/60 mL Susp Take 60 mL (15 g) by mouth once a week. (Patient not taking: Reported on 07/20/2019) 473 mL 3   ??? ticagrelor (BRILINTA) 90 mg Tab TAKE 1 TABLET BY MOUTH TWICE DAILY 180 tablet 3   ??? UNIFINE PENTIPS 31 gauge x 5/16 (8 mm) Ndle USE WITH INSULIN AND VICTOZA 5 TIMES DAILY 400 each 3     No current facility-administered medications for this visit.     Allergies:   Allergies   Allergen Reactions   ??? Losartan      Hyperkalemia (K>6)   ??? Opioids - Morphine Analogues Anxiety     Palliative Performance Scale: 40% - Ambulation: Mainly bed / Unable to do any work, extensive disease / Self-Care:M [employer] / Intake: Normal or reduced / Level of Conscious: Full , drowsy, or confusion    PHYSICAL EXAM:   Vital signs for this encounter: There were no vitals taken for this visit.  GEN: Alert, oriented x4, chronically ill, pale, fatigued  EYES: Sclera clear  CARDIO: RRR  RESP: NWOB on room air, normal chest rise, diminished breath sounds bilaterally  GI: obese, soft, NT, +BS  SKIN: edema of abdomen, lower extremities    I spent >50% of 60 minutes total patient care time on 07/20/2019 in counseling and coordinating care with primary nephrologist.     Cicero Duck, DNP, ANP-BC  [facility]

## 2019-07-22 ENCOUNTER — Ambulatory Visit: Admit: 2019-07-22 | Discharge: 2019-07-23 | Payer: MEDICARE | Attending: Adult Health | Primary: Adult Health

## 2019-07-22 DIAGNOSIS — I5042 Chronic combined systolic (congestive) and diastolic (congestive) heart failure: Principal | ICD-10-CM

## 2019-07-22 LAB — MAGNESIUM
MAGNESIUM: 1.9 mg/dL (ref 1.6–2.6)
Magnesium:MCnc:Pt:Ser/Plas:Qn:: 1.9

## 2019-07-22 MED FILL — NITROGLYCERIN 0.4 MG SUBLINGUAL TABLET: 33 days supply | Qty: 100 | Fill #1 | Status: AC

## 2019-07-22 MED FILL — AMLODIPINE 10 MG TABLET: ORAL | 90 days supply | Qty: 90 | Fill #2

## 2019-07-22 MED FILL — ALLOPURINOL 100 MG TABLET: ORAL | 30 days supply | Qty: 60 | Fill #1

## 2019-07-22 MED FILL — VENTOLIN HFA 90 MCG/ACTUATION AEROSOL INHALER: 25 days supply | Qty: 18 | Fill #2 | Status: AC

## 2019-07-22 MED FILL — OZEMPIC 0.25 MG OR 0.5 MG (2 MG/1.5 ML) SUBCUTANEOUS PEN INJECTOR: 84 days supply | Qty: 4 | Fill #2 | Status: AC

## 2019-07-22 MED FILL — NITROGLYCERIN 0.4 MG SUBLINGUAL TABLET: SUBLINGUAL | 33 days supply | Qty: 100 | Fill #1

## 2019-07-22 MED FILL — ALLOPURINOL 100 MG TABLET: 30 days supply | Qty: 60 | Fill #1 | Status: AC

## 2019-07-22 MED FILL — AMLODIPINE 10 MG TABLET: 90 days supply | Qty: 90 | Fill #2 | Status: AC

## 2019-07-22 MED FILL — OMEPRAZOLE 40 MG CAPSULE,DELAYED RELEASE: ORAL | 90 days supply | Qty: 180 | Fill #2

## 2019-07-22 MED FILL — RENVELA 800 MG TABLET: 90 days supply | Qty: 540 | Fill #2 | Status: AC

## 2019-07-22 MED FILL — OMEPRAZOLE 40 MG CAPSULE,DELAYED RELEASE: 90 days supply | Qty: 180 | Fill #2 | Status: AC

## 2019-07-22 MED FILL — OZEMPIC 0.25 MG OR 0.5 MG (2 MG/1.5 ML) SUBCUTANEOUS PEN INJECTOR: SUBCUTANEOUS | 84 days supply | Qty: 4.5 | Fill #2

## 2019-07-22 MED FILL — VENTOLIN HFA 90 MCG/ACTUATION AEROSOL INHALER: RESPIRATORY_TRACT | 25 days supply | Qty: 18 | Fill #2

## 2019-07-22 MED FILL — RENVELA 800 MG TABLET: 90 days supply | Qty: 540 | Fill #2

## 2019-07-22 NOTE — Unmapped (Signed)
[place] [facility]ures[facility] Clinic Note    Primary Cardiologist/Cardiology Provider(s):  Dr. Hoy Finlay  PCP:  Kurtis Bushman, MD    Last Cardiology Clinic Visit:  07/14/2019  Last IV Diuresis Visit: 07/20/2019    Reason for Visit:  Jeremy Oliver is a 49 y.o. male with a history of combined systolic and diastolic heart failure, who is being seen today in the [facility] IV Diuresis Clinic for an urgent visit for volume status optimization including IV diuresis.    Assessment/Plan:  1. Acute on Chronic combined systolic and diastolic heart failure, acute on chronic kidney disease  - Volume status today is improved. 388 --> 379 --> 380.5 --> 380 --> 383 -->373.8 --> 367.4  - NYHA Class II-III  - IV Lasix 160 mg x1  was administered during clinic with good response  - Labs were obtained: Cr 6.2, K 5.5  - his living situation is likely contributing to his inability to be compliant with diet and weight monitoring; just recently moved into housing  - continue Bumex 5 mg twice daily. Continue to work on reducing sodium.   - Plan for direct admission; return Monday to diuresis clinic if no bed    Response to IV diuresis:         Medications Heart rate Blood pressure Weight (lbs)   Pre  80 152/81 367.4   Hour 0 IV Lasix 160 mg      Hour 2 IV Lasix 0 mg      Post    366     Output:  I/O       04/21 0701 - 04/22 0700 04/22 0701 - 04/23 0700 04/23 0701 - 04/24 0700    Urine (mL/kg/hr)   650    Total Output(mL/kg)   650 (3.9)    Net   -650               Return in about 3 days (around 07/25/2019) for Diuresis Clinic.      Future Appointments   Date Time Provider Department Center   07/25/2019 10:00 AM DIURESIS HF NP [place] [facility]   07/26/2019  8:15 AM [place] EP REMOTE MONITORING [facility]   08/02/2019 12:00 PM Pallav Carlean Purl, MD NEUR [facility]   08/03/2019  8:20 AM Roma Schanz, MD [facility]   08/03/2019  9:00 AM Mila Merry, MD [facility]   01/16/2020  8:30 AM Monica Martinez, MD [facility]       History of Present Illness:  Jeremy Oliver is a 49 y.o. male with a history of combined systolic and diastolic heart failure who presents today for IV diuresis. He also has a history of significant CAD. Patient last seen in clinic on 06/29/2019. Since that time, he has gained the 4 pounds he lost last week during IV diuresis and gained an additional 2 pounds.  He was seen in the IV diuresis clinic on 06/29/2019 and was given furosemide 160 mg IV x2.  He had a good response despite his chronic kidney disease.  He had an improvement in symptoms.      He was seen in follow-up 07/04/19. He noted more orthopnea over the past 2 nights despite being on BiPAP.  He was seen by Dr. Cherly Hensen in nephrology that morning and was referred back to IV diuresis.  He had generally not been weighing at home and endorsed shortness of breath with walking short distances.  He  was able to walk from his car in the parking deck to the clinic lobby but needed a wheelchair to get the rest of the way into the clinic.  He had lower extremity edema to his knee.  He was taking Bumex 5 mg twice daily. Over the weekend he ate a slice of pizza that was supreme as well as have a salad with ranch dressing.  He is drinking several sodas.  He did not start hydralazine as recommended last week. He had good rsponse to IV Lasix 160mg  x1.     He was seen 4/7 for follow-up. His weight was down. He said he felt pretty good - was able to walk into the building. He was urinating a lot with the Bumex.  He was instructed to take metolazone 2.5mg  x1 that morning.     He was last seen 4/9 and felt about the same as he did at his previous visit. His breathing was fine; he was able to walk into clinic today without much difficulty. He and his wife found housing, put a deposit down w/ plans to move in the following weekend. He was not given IV Lasix due to stability of weights and worsening Cr to 5.9 after taking metolazone 2.5mg  x1. He was instructed not to repeat metolazone over the weekend.    He had repeat labs which showed Cr 5.92 and K 6.3. he was instructed to take SPS x3 days in a row and scheduled for f/u in Diuresis Clinic. He discussed dialysis w/ his nephrologist but stated he really wanted to wait to start until he was in stable housing. Last seen 4/15 and weight was stable and he felt about the same as last visit. His LE edema was stable. Breathing was OK, he was able to walk in without much trouble. He was sleeping fine without orthopnea. He had tried to make some diet changes; when they had chinese food, he only used 1 packet of soy sauce instead of several. He was diuresed with IV Lasix 160mg  x1.    07/19/2019 he presents for follow-up. He has been planning direct admission w/ his nephrologist to start dialysis, but there is no open bed yet. He feels his LE edema has improved. He was again able to walk in without a wheelchair. He isn't sleeping well, feels like he is drowning when he lays back. He was previously sleeping on a couch and now that he has moved into housing, he is sleeping in a bed. He felt better when he laid on his side.     He was last seen 4/21 for follow-up of diuresis clinic visit the day prior. Weight down ~3 lbs. He reported feeling better and stated that he had great output throughout the evening. However, he was unable to walk into clinic due to leg weakness and needed his wife to push him in a wheelchair.  Pt endorsed breathing better and was able to sleep some last night on his side. He had a decreased appetite and ate half a bowl of raisin bran for dinner.    Today he presents for follow-up. Weight is down from last visit again. He walked in, did not need to use the wheelchair. He says he is feeling frustrated because he received a call that a bed was ready for him, but then got another call saying it was a mistake. He just wants to get it over with.    Past Medical History:   Diagnosis Date   ??? Angina  at rest (CMS-[name])    ??? Arthritis    ??? Asthma    ??? Atrial fibrillation and flutter (CMS-[name]) 06/08/14   ??? BMI 40.0-44.9, adult (CMS-[name]) 10/29/2015   ??? Chronic anticoagulation 02/17/2018   ??? Chronic kidney disease, stage 3    ??? Chronic pain syndrome 04/11/2014    ~Use daily lyrica as recommended ~Tramadol as needed for pain ~Fluoxetine daily as recommended ~Daily exercise ~Eat a healthy diet ~Good control of your blood sugars can help    ??? Coronary artery disease    ??? Depression    ??? Diabetes mellitus (CMS-[name])    ??? Eye trauma 1998    glass in both eyes and removed   ??? GERD (gastroesophageal reflux disease)    ??? Gout    ??? Homeless    ??? Hyperlipidemia    ??? Hypertension    ??? Illiteracy and low-level literacy    ??? Microscopic hematuria    ??? Migraine    ??? Nephrolithiasis    ??? 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 Nonalcoholic fatty liver disease    ??? NSTEMI (non-ST elevated myocardial infarction) (CMS-[name]) 01/13/2017   ??? Obesity    ??? Obstructive sleep apnea 2009   ??? Panic attacks    ??? 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 Seizure-like activity (CMS-[name])     ~Monitor for symptoms and report them to your primary care provider if they occur    ??? Systolic CHF, chronic (CMS-[name])     EF 40-45% 2017   ??? TIA (transient ischemic attack)      Past Surgical History:   Procedure Laterality Date   ??? CARDIAC CATHETERIZATION  03/08/2007   ??? CORONARY STENT PLACEMENT     ??? CYSTOURETHROSCOPY  12/31/2009     Microscopic hematuria   ??? KNEE SURGERY Right 09/23/2006    arthroscopic knee surgery medial and lateral meniscus tears as well as crystal disease   ??? PR CATH PLACE/CORON ANGIO, IMG SUPER/INTERP,R&L HRT CATH, L HRT VENTRIC N/A 02/13/2017    Procedure: Left/Right Heart Catheterization;  Surgeon: Marlaine Hind, MD;  Location: [facility] CATH;  Service: Cardiology   ??? PR CATH PLACE/CORON ANGIO, IMG SUPER/INTERP,W LEFT HEART VENTRICULOGRAPHY N/A 03/17/2017    Procedure: Left Heart Catheterization W Intervention;  Surgeon: Marlaine Hind, MD;  Location: [facility] CATH;  Service: Cardiology   ??? PR CATH PLACE/CORON ANGIO, IMG SUPER/INTERP,W LEFT HEART VENTRICULOGRAPHY N/A 05/12/2018    Procedure: CATH LEFT HEART CATHETERIZATION W INTERVENTION;  Surgeon: Dorathy Kinsman, MD;  Location: [facility] CATH;  Service: Cardiology   ??? PR CATH PLACE/CORON ANGIO, IMG SUPER/INTERP,W LEFT HEART VENTRICULOGRAPHY N/A 10/22/2018    Procedure: CATH LEFT HEART CATHETERIZATION W INTERVENTION;  Surgeon: Marlaine Hind, MD;  Location: [facility] CATH;  Service: Cardiology   ??? PR ECMO/ECLS INITIATION VENO-ARTERIAL N/A 03/19/2017    Procedure: ECMO/ECLS; INITIATION, VENO-ARTERIAL;  Surgeon: Lennie Odor, MD;  Location: MAIN OR [facility];  Service: Cardiac Surgery   ??? PR ECMO/ECLS RMVL PRPH CANNULA OPEN 6 YRS & OLDER Left 03/25/2017    Procedure: ECMO / ECLS  PROVIDED BY PHYSICIAN; REMOVAL OF PERIPHERAL (ARTERIAL AND/OR VENOUS) CANNULA(E), OPEN, 6 YEARS AND OLDER;  Surgeon: Alonna Buckler Ikonomidis, MD;  Location: MAIN OR [facility];  Service: Cardiac Surgery   ??? PR EPHYS EVAL W/ ABLATION SUPRAVENT ARRHYTHMIA N/A 07/19/2015    Catheter ablation of cavotricuspid isthmus for atrial flutter [facility])   ??? PR INSER HART PACER XVENOUS ATRIAL N/A 05/30/2015    [employer] dual-chamber pacemaker implant (E.Chung)   ??? PR INSERT/PLACE FLOW DIRECT CATH N/A 03/17/2017    Procedure: Insert Leave In [place];  Surgeon: Marlaine Hind, MD;  Location: [facility] CATH;  Service: Cardiology   ??? PR INSJ/RPLCMT PERM DFB W/TRNSVNS LDS 1/DUAL CHMBR N/A 05/04/2017 Procedure: ICD Implant System (Single/Dual);  Surgeon: Eldred Manges, MD;  Location: [facility] CATH;  Service: Cardiology   ??? PR NEGATIVE PRESSURE WOUND THERAPY DME >50 SQ CM Left 03/25/2017    Procedure: Neg Press Wound Tx (Vac Assist) Incl Topicals, Per Session, Tsa Greater Than/= 50 Cm Squared;  Surgeon: Alonna Buckler Ikonomidis, MD;  Location: MAIN OR [facility];  Service: Cardiac Surgery   ??? PR PRQ TRLUML CORONARY STENT W/ANGIO ONE ART/BRNCH N/A 03/12/2018    Procedure: Percutaneous Coronary Intervention;  Surgeon: Marlaine Hind, MD;  Location: [facility] CATH;  Service: Cardiology   ??? PR REBL VES DIRECT,LOW EXTREM Left 03/19/2017    Procedure: Repr Bld Vessel Direct; Lower Extrem;  Surgeon: Boykin Reaper, MD;  Location: MAIN OR [facility];  Service: Vascular   ??? PR REMV ART CLOT ILIAC-POP,LEG INCIS Left 03/25/2017    Procedure: EMBOLECTOMY OR THROMBECTOMY, WITH OR WITHOUT CATHETER; FEMOROPOPLITEAL, AORTOILIAC ARTERY, BY LEG INCISION;  Surgeon: Alonna Buckler Ikonomidis, MD;  Location: MAIN OR [facility];  Service: Cardiac Surgery   ??? PR UPPER GI ENDOSCOPY,BIOPSY N/A 02/28/2016    Procedure: UGI ENDOSCOPY; WITH BIOPSY, SINGLE OR MULTIPLE;  Surgeon: Liane Comber, MD;  [facility]: [facility];  Service: Gastroenterology   ??? PR UPPER GI ENDOSCOPY,DIAGNOSIS N/A 05/07/2016    Procedure: UGI ENDO, INCLUDE ESOPHAGUS, STOMACH, & DUODENUM &/OR JEJUNUM; DX W/WO COLLECTION SPECIMN, BY BRUSH OR WASH;  Surgeon: Modena Nunnery, MD;  Location: [facility];  Service: Gastroenterology       Current Outpatient Medications   Medication Sig Dispense Refill   ??? acetaminophen (TYLENOL) 500 MG tablet TAKE 2 TABLETS (1 000MG ) BY MOUTH EVERY 8 HOURS AS NEEDED FOR PAIN 180 each 3   ??? albuterol HFA 90 mcg/actuation inhaler Inhale 2 puffs into the lungs every 6 (six) hours as needed for wheezing or shortness of breath. 18 g 2   ??? alirocumab (PRALUENT) 150 mg/mL subcutaneous injection Inject the contents of 1 pen (150 mg total) under the skin every fourteen (14) days. 6 mL 3   ??? allopurinoL (ZYLOPRIM) 100 MG tablet Take 2 tablets (200 mg total) by mouth daily. 60 tablet 5   ??? amiodarone (PACERONE) 200 MG tablet TAKE 1 TABLET BY MOUTH ONCE DAILY 90 tablet 3   ??? amLODIPine (NORVASC) 10 MG tablet Take 1 tablet (10 mg total) by mouth daily. 90 tablet 3   ??? atorvastatin (LIPITOR) 80 MG tablet Take 1 tablet (80 mg total) by mouth daily. 90 tablet 3   ??? blood sugar diagnostic Strp Disp to match current ins approved meter. Use to test blood sugar four times daily. E11.22, Z79.4 400 each 3   ??? blood-glucose meter kit Disp. blood glucose meter kit preferred by patient's insurance. Dx: Diabetes, E11.9 1 each 11   ??? bumetanide (  BUMEX) 2 MG tablet Take 2.5 tablets (5 mg total) by mouth two (2) times a day. 450 tablet 3   ??? carvediloL (COREG) 25 MG tablet Take 1.5 tablets (37.5 mg total) by mouth Two (2) times a day. 270 tablet 3   ??? diclofenac sodium (VOLTAREN) 1 % gel Apply 2 grams topically Three (3) times a day. 180 g 11   ??? ezetimibe (ZETIA) 10 mg tablet TAKE 1 TABLET BY MOUTH ONCE DAILY 90 each 3   ??? FLUoxetine (PROZAC) 40 MG capsule Take 2 capsules (80 mg total) by mouth daily. 180 capsule 3   ??? gabapentin (NEURONTIN) 300 MG capsule Take 1 capsule (300 mg total) by mouth two (2) times a day. 180 capsule 3   ??? hydrOXYzine (ATARAX) 10 MG tablet Take 1 tablet (10 mg total) by mouth nightly as needed for anxiety. 30 tablet 0   ??? insulin ASPART (NOVOLOG FLEXPEN U-100 INSULIN) 100 unit/mL (3 mL) injection pen Inject 0.22 mL (22 Units total) under the skin Three (3) times a day with a meal. 15 mL 11   ??? insulin detemir U-100 (LEVEMIR) 100 unit/mL (3 mL) injection pen Inject 45 Units total under the skin nightly. 15 mL 3   ??? insulin syringe-needle U-100 0.3 mL 31 gauge x 5/16 Syrg Use to inject insulin twice daily with meals 60 each 0   ??? isosorbide mononitrate (IMDUR) 30 MG 24 hr tablet Take 4 tablets (120 mg total) by mouth daily. 120 tablet 3   ??? lancets Misc Disp. Ins preferred lancets. Use to test blood sugar four times daily. E11.22, Z79.4 400 each 3   ??? loperamide (IMODIUM) 2 mg capsule Take 1 capsule (2 mg total) by mouth 4 (four) times a day as needed for diarrhea. 30 capsule 0   ??? miscellaneous medical supply (BLOOD PRESSURE CUFF) Misc Measure BP once a day or if any symptoms 1 each 0   ??? nitroglycerin (NITROSTAT) 0.4 MG SL tablet Place 1 tablet (0.4 mg total) under the tongue every five (5) minutes as needed for chest pain. (Patient not taking: Reported on 07/20/2019) 100 each 3   ??? omeprazole (PRILOSEC) 40 MG capsule Take 1 capsule (40 mg total) by mouth Two (2) times a day (30 minutes before a meal). 180 capsule 3   ??? ondansetron (ZOFRAN) 4 MG tablet Take 1 tablet (4 mg total) by mouth every eight (8) hours as needed for nausea for up to 4 days. 10 tablet 0   ??? ondansetron (ZOFRAN) 4 MG tablet Take 1 tablet (4 mg total) by mouth every eight (8) hours as needed for nausea. 30 tablet 0   ??? rivaroxaban (XARELTO) 15 mg Tab Take 1 tablet (15 mg total) by mouth daily with evening meal. 90 tablet 3   ??? semaglutide 0.25 mg or 0.5 mg(2 mg/1.5 mL) PnIj Inject 0.5 mg under the skin every seven (7) days. 4.5 mL 3   ??? sevelamer (RENVELA) 800 mg tablet TAKE 2 TABLETS (1600MG ) BY MOUTH THREE TIMES DAILY WITH MEALS 540 tablet 3   ??? sodium polystyrene, SPS, with sorbitol (SPS, WITH SORBITOL,) 15-20 gram/60 mL Susp Take 60 mL (15 g) by mouth once a week. (Patient not taking: Reported on 07/20/2019) 473 mL 3   ??? ticagrelor (BRILINTA) 90 mg Tab TAKE 1 TABLET BY MOUTH TWICE DAILY 180 tablet 3   ??? UNIFINE PENTIPS 31 gauge x 5/16 (8 mm) Ndle USE WITH INSULIN AND VICTOZA 5 TIMES DAILY 400 each 3  No current facility-administered medications for this visit.     Allergies   Allergen Reactions   ??? Losartan      Hyperkalemia (K>6)   ??? Opioids - Morphine Analogues Anxiety       Objective:       Physical Exam  BP 152/81 (BP Site: L Arm, BP Position: Sitting, BP Cuff Size: Large)  - Pulse 80  - Temp 36.3 ??C (97.4 ??F) (Temporal)  - Wt (!) 166 kg (366 lb)  - SpO2 97%  - BMI 46.99 kg/m??    Wt Readings from Last 12 Encounters:   07/22/19 (!) 166 kg (366 lb)   07/20/19 (!) 169.6 kg (373 lb 12.8 oz)   07/19/19 (!) 171 kg (377 lb)   07/15/19 (!) 173.7 kg (383 lb)   07/14/19 (!) 171.7 kg (378 lb 9.6 oz)   07/14/19 (!) 172.4 kg (380 lb)   07/08/19 (!) 172.6 kg (380 lb 8 oz)   07/06/19 (!) 171.9 kg (378 lb 14.4 oz)   07/04/19 (!) 174.1 kg (383 lb 12.8 oz)   07/04/19 (!) 176.6 kg (389 lb 4.8 oz)   06/29/19 (!) 172.8 kg (380 lb 14.4 oz)   06/29/19 (!) 174.5 kg (384 lb 9.6 oz)       General:  Patient is chronically ill-appearing in no acute distress   Eyes:  Intact, sclerae anicteric.   Ears, nose, mouth: Benign   Respiratory:   Normal respiratory effort. Clear to auscultation bilaterally.  There are no wheezes.   Cardiovascular:  JVP not seen above the clavicle with HOB at 90 degrees.  Rate and rhythm are regular.  There is no lifts or heaves.  Normal S1, S2. There is no murmur, gallops or rubs, Radial and pedal pulses are 1-2+, bilaterally.   There is 1- 2+ pitting pedal/pretibial edema, bilaterally, a bit improved from prior.   Gastrointestinal:   Soft, non-tender, with audible bowel sounds. Abdomen nondistended.  Liver is nonpalpable.   Musculoskeletal: No joint swelling   Skin: Warm, well perfused.   Neurologic: Appropriate mood and affect. Alert and oriented to person, place, and time. No gross motor or sensory deficits evident.     Recent Labs:  Office Visit on 07/22/2019   Component Date Value Ref Range Status   ??? Magnesium 07/22/2019 1.9  1.6 - 2.6 mg/dL Final   ??? Sodium, POCT 07/22/2019 136  135 - 145 mmol/L Final   ??? Potassium, POCT 07/22/2019 5.5* 3.5 - 5.0 mmol/L Final   ??? Chloride, POCT 07/22/2019 100  98 - 107 mmol/L Final   ??? CO2, POCT 07/22/2019 22  22 - 30 mmol/L Final   ??? BUN, POCT 07/22/2019 57* 7 - 21 mg/dL Final   ??? Creatinine, POCT 07/22/2019 6.2* 0.7 - 1.3 mg/dL Final   ??? Glucose, POCT 07/22/2019 223* 70 - 179 mg/dL Final   ??? Calcium, POCT 07/22/2019 8.1* 8.5 - 10.2 mg/dL Final   Office Visit on 07/20/2019   Component Date Value Ref Range Status   ??? Magnesium 07/20/2019 1.7  1.6 - 2.6 mg/dL Final   ??? PRO-BNP 84/69/6295 6,530.0* 0.0 - 138.0 pg/mL Final   ??? Sodium, POCT 07/20/2019 134* 135 - 145 mmol/L Final   ??? Potassium, POCT 07/20/2019 5.0  3.5 - 5.0 mmol/L Final   ??? Chloride, POCT 07/20/2019 98  98 - 107 mmol/L Final   ??? CO2, POCT 07/20/2019 24  22 - 30 mmol/L Final   ??? BUN, POCT 07/20/2019 55* 7 - 21  mg/dL Final   ??? Creatinine, POCT 07/20/2019 5.9* 0.7 - 1.3 mg/dL Final   ??? Glucose, POCT 07/20/2019 214* 70 - 179 mg/dL Final   ??? Calcium, POCT 07/20/2019 8.2* 8.5 - 10.2 mg/dL Final   Office Visit on 07/19/2019   Component Date Value Ref Range Status   ??? Magnesium 07/19/2019 1.9  1.6 - 2.6 mg/dL Final   ??? Sodium, POCT 07/19/2019 140  135 - 145 mmol/L Final   ??? Potassium, POCT 07/19/2019 5.9* 3.5 - 5.0 mmol/L Final   ??? Chloride, POCT 07/19/2019 102  98 - 107 mmol/L Final   ??? CO2, POCT 07/19/2019 25  22 - 30 mmol/L Final   ??? BUN, POCT 07/19/2019 54* 7 - 21 mg/dL Final   ??? Creatinine, POCT 07/19/2019 5.8* 0.7 - 1.3 mg/dL Final   ??? Glucose, POCT 07/19/2019 250* 70 - 179 mg/dL Final   ??? Calcium, POCT 07/19/2019 8.3* 8.5 - 10.2 mg/dL Final       Lab Results   Component Value Date    PRO-BNP 6,530.0 (H) 07/20/2019    PRO-BNP 5,060.0 (H) 07/14/2019    PRO-BNP 4,050.0 (H) 07/08/2019    PRO-BNP 5,610.0 (H) 07/06/2019    PRO-BNP 5,180.0 (H) 07/04/2019    PRO-BNP 231 (H) 06/08/2014    Creatinine, POCT 6.2 (H) 07/22/2019    Creatinine, POCT 5.9 (H) 07/20/2019    Creatinine, POCT 5.8 (H) 07/19/2019    Creatinine, POCT 5.6 (H) 07/14/2019    Creatinine, POCT 5.9 (H) 07/08/2019    Creatinine 5.92 (H) 07/11/2019    BUN 55 (H) 07/11/2019    BUN, POCT 57 (H) 07/22/2019    BUN, POCT 55 (H) 07/20/2019    BUN, POCT 54 (H) 07/19/2019    BUN, POCT 51 (H) 07/14/2019    BUN, POCT 51 (H) 07/08/2019 Magnesium 1.9 07/22/2019    Magnesium 1.7 07/20/2019    Magnesium 1.9 07/19/2019    Magnesium 1.9 07/14/2019    Magnesium 1.8 07/08/2019    Magnesium 2.0 06/09/2014    Magnesium 1.7 05/12/2011    Magnesium 1.8 01/09/2011       Metric Tracker:  Did today's visit result in ED visit? No  Did today's visit result in hospital admission? No  Did today's visit result in referral to cardiology? n/a  Today's visit was a referral from Nephrology

## 2019-07-22 NOTE — Unmapped (Signed)
IV Diuresis Clinic Discharge Instructions:    MEDICATIONS:  NO medication changes today.    Call if you have questions about your medications.    IMPORTANT INSTRUCTIONS:  - Weigh yourself daily in the morning; write your weight down and bring it with you next time   - Limit your fluid intake to 2 Liters (half-gallon) per day    - Limit your salt intake to 2-3 grams (2000-3000 mg) per day  - If you have a morning diuresis appointment, don't take your morning diuretic  - If you have an afternoon diuresis appointment, take your morning diuretic    LABS:  We will call you if your labs need attention    NEXT APPOINTMENT:  Return to clinic in 3 days with IV Diuresis - Monday at     [facility] has moved. Our new address is [address] [place], [place] [address], 2nd floor.     My office number (c/o De Burrs RN) is [phone] if you need further assistance, or need to schedule with our IV Diuresis clinic in the future.    If you need to reschedule future appointments, please call [phone] or [phone]. After office hours, if you have urgent questions/problems, contact the on-call cardiologist through the hospital operator: [phone].

## 2019-07-25 ENCOUNTER — Ambulatory Visit
Admit: 2019-07-25 | Discharge: 2019-07-30 | Disposition: A | Discharge status: Home Health | Payer: MEDICARE | Admitting: Nephrology

## 2019-07-25 ENCOUNTER — Ambulatory Visit
Admit: 2019-07-25 | Discharge: 2019-07-30 | Disposition: A | Discharge status: Home Health | Payer: MEDICARE | Attending: Adult Health | Admitting: Nephrology

## 2019-07-25 DIAGNOSIS — I5042 Chronic combined systolic (congestive) and diastolic (congestive) heart failure: Principal | ICD-10-CM

## 2019-07-25 LAB — CALCIUM
Calcium:MCnc:Pt:Ser/Plas:Qn:: 8.3 — ABNORMAL LOW
Calcium:MCnc:Pt:Ser/Plas:Qn:: 8.3 — ABNORMAL LOW

## 2019-07-25 LAB — CBC W/ AUTO DIFF
BASOPHILS ABSOLUTE COUNT: 0 10*9/L (ref 0.0–0.1)
BASOPHILS RELATIVE PERCENT: 0.3 %
EOSINOPHILS ABSOLUTE COUNT: 0.3 10*9/L (ref 0.0–0.4)
EOSINOPHILS RELATIVE PERCENT: 2.2 %
HEMOGLOBIN: 9.9 g/dL — ABNORMAL LOW (ref 13.5–17.5)
LARGE UNSTAINED CELLS: 2 % (ref 0–4)
LYMPHOCYTES ABSOLUTE COUNT: 1.3 10*9/L — ABNORMAL LOW (ref 1.5–5.0)
LYMPHOCYTES RELATIVE PERCENT: 11.3 %
MEAN CORPUSCULAR HEMOGLOBIN CONC: 30.5 g/dL — ABNORMAL LOW (ref 31.0–37.0)
MEAN CORPUSCULAR HEMOGLOBIN: 26.1 pg (ref 26.0–34.0)
MEAN CORPUSCULAR VOLUME: 85.5 fL (ref 80.0–100.0)
MONOCYTES ABSOLUTE COUNT: 0.7 10*9/L (ref 0.2–0.8)
MONOCYTES RELATIVE PERCENT: 5.7 %
NEUTROPHILS ABSOLUTE COUNT: 9 10*9/L — ABNORMAL HIGH (ref 2.0–7.5)
NEUTROPHILS RELATIVE PERCENT: 78.7 %
PLATELET COUNT: 334 10*9/L (ref 150–440)
RED BLOOD CELL COUNT: 3.78 10*12/L — ABNORMAL LOW (ref 4.50–5.90)
RED CELL DISTRIBUTION WIDTH: 15.6 % — ABNORMAL HIGH (ref 12.0–15.0)

## 2019-07-25 LAB — BASIC METABOLIC PANEL
ANION GAP: 15 mmol/L (ref 7–15)
BLOOD UREA NITROGEN: 60 mg/dL — ABNORMAL HIGH (ref 7–21)
CHLORIDE: 103 mmol/L (ref 98–107)
CO2: 19 mmol/L — ABNORMAL LOW (ref 22.0–30.0)
CREATININE: 6.25 mg/dL — ABNORMAL HIGH (ref 0.70–1.30)
EGFR CKD-EPI AA MALE: 11 mL/min/{1.73_m2} — ABNORMAL LOW (ref >=60)
EGFR CKD-EPI NON-AA MALE: 10 mL/min/{1.73_m2} — ABNORMAL LOW (ref >=60)
GLUCOSE RANDOM: 147 mg/dL (ref 70–179)
POTASSIUM: 5.6 mmol/L — ABNORMAL HIGH (ref 3.5–5.0)
SODIUM: 137 mmol/L (ref 135–145)

## 2019-07-25 LAB — SMEAR REVIEW

## 2019-07-25 LAB — PARATHYROID HOMONE (PTH): PARATHYROID HORMONE INTACT: 403.4 pg/mL — ABNORMAL HIGH (ref 12.0–72.0)

## 2019-07-25 LAB — LIPASE: Triacylglycerol lipase:CCnc:Pt:Ser/Plas:Qn:: 202

## 2019-07-25 LAB — IRON PANEL
IRON: 32 ug/dL — ABNORMAL LOW (ref 35–165)
TOTAL IRON BINDING CAPACITY (CALC): 282 mg/dL (ref 252.0–479.0)
TRANSFERRIN: 223.8 mg/dL (ref 200.0–380.0)

## 2019-07-25 LAB — PRO-BNP
Natriuretic peptide.B prohormone N-Terminal:MCnc:Pt:Ser/Plas:Qn:: 5170 — ABNORMAL HIGH
Natriuretic peptide.B prohormone N-Terminal:MCnc:Pt:Ser/Plas:Qn:: 5460 — ABNORMAL HIGH

## 2019-07-25 LAB — ALBUMIN
ALBUMIN: 3.7 g/dL (ref 3.5–5.0)
Albumin:MCnc:Pt:Ser/Plas:Qn:: 3.3 — ABNORMAL LOW
Albumin:MCnc:Pt:Ser/Plas:Qn:: 3.7

## 2019-07-25 LAB — POTASSIUM WHOLE BLOOD: Potassium:SCnc:Pt:Bld:Qn:: 5.3 — ABNORMAL HIGH

## 2019-07-25 LAB — PHOSPHORUS: Phosphate:MCnc:Pt:Ser/Plas:Qn:: 6.9 — ABNORMAL HIGH

## 2019-07-25 LAB — BASOPHILS ABSOLUTE COUNT: Basophils:NCnc:Pt:Bld:Qn:Automated count: 0

## 2019-07-25 LAB — MAGNESIUM: Magnesium:MCnc:Pt:Ser/Plas:Qn:: 1.7

## 2019-07-25 LAB — TOTAL IRON BINDING CAPACITY (CALC): Lab: 282

## 2019-07-25 LAB — FERRITIN: Ferritin:MCnc:Pt:Ser/Plas:Qn:: 150

## 2019-07-25 LAB — ALT (SGPT): Alanine aminotransferase:CCnc:Pt:Ser/Plas:Qn:: 8

## 2019-07-25 LAB — HEMOGLOBIN BLOOD GAS: Hemoglobin:MCnc:Pt:Bld:Qn:: 10.4 — ABNORMAL LOW

## 2019-07-25 LAB — CO2: Carbon dioxide:SCnc:Pt:Ser/Plas:Qn:: 19 — ABNORMAL LOW

## 2019-07-25 NOTE — Unmapped (Signed)
Nephrology ([facility]) History & Physical    Assessment & Plan:   Jeremy Oliver is a 49 y.o. male with PMHx of CKD, CAD, AICD, DM, HTN, HLD, HFrEF (EF 35% w G1DD), TIA, A fib on xarelto that presented as a direct admission for new start dialysis in setting of refractory volume overload.    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 *      HFrEF (EF 35% w G1DD) w volume overload refractory to diuretics requiring HD - CKD: Followed in diuresis clinic for IV diuretics twice weekly in addition to home Bumex 5mg  BID but has had persistent weight gain and with worsening renal function (Cr 5.8 on day of admission) requiring new start dialysis. Wt is 360lbs on arrival, which has downtrended from 388lbs ~3weeks ago. Pt with comfortable WOB on RA though some dependent rales on exam.  - Cardiology consult  - New start HD labs ordered  - Consult to VIR for tunneled line, thereafter start HD.   - HOLD xarelto for line, last dose 4/26  - Continue home bumex 5mg  BID, consider additional diuresis as needed with lasix 160mg  IV  - Cont home carvedilol  - NPO at MN, hold AM subcutaneous heparin for DVT ppx, resume if not getting procedure or post-procedure    OSA: BiPAP for sleep    CAD - Paroxysmal Afib: S/p placement of 7 DES, most recently July 2020 to distal RCA. On ticagrelor. Some exertional CP at baseline, uses nitro PRN, last time ~2wks ago. No CP on arrival.  - EKG pending  - Continue ticagrelor in setting of recent stent placement in last year  - Hold xarelto  - Cards consult  - Cont home amiodarone    Hyperkalemia/Hyperphosphatemia:  K 5.3 in clinic AM of admission.  - Lokelma 10g x1, recheck K in AM  - Cont home sevelamer 1600mg  TID  - Chem 10 pending    HTN: Cont home amlodipine, carvedilol, Imdur  T2DM: Home regimen lantus 45u at bedtime, humalog 22u TIDAC  - Lantus 35u at bedtime  - Lispro 10u TIDAC w SSI  - Home gabapentin for peripheral neuropathy 300mg  BID  Anxiety: Cont home fluoxetine, atarax  HLD: Cont home atorvastatin, ezetimibe   GERD: Cont home protonix      Daily Checklist:  Diet: Heart Healthy (3g Na)  DVT PPx: Heparin 5000units q8h  Electrolytes: No Repletion Needed  Code Status: Full Code  Dispo: Admit to Med B    Chief Concern:   No Principal Problem: There is no principal problem currently on the Problem List. Please update the Problem List and refresh.    Subjective:   HPI:  Jeremy Route Siguenza is a 49 y.o. male with PMHx of CKD, CAD s/p 7 DES most recent 09/2018 on ticagrelor, AICD, DM, HTN, HLD, HFrEF (EF 35% w G1DD), TIA, paroxysmal A fib on xarelto who presented as a direct admission for new start dialysis in setting of volume overload refractory to diuretics.    Mr.  Oliver states that he has been having shortness of breath, both with laying flat as well as with walking very short distances. The orthopnea has improved with the initiation of diuresis clinic 2 weeks ago. He states that the shortness of breath with exertion is onset with only walking about 15-21ft and is associated with substernal chest pain. He has taken multiple nitroglycerin for this chest pain, last was about 2 weeks ago. He also has some associated dizziness and lightheadedness during these episodes. Aside from the exertional chest pain/SOB he has also had multiple recent episodes of sharp abdominal pain. Initially the pain was in the RLQ and now in the epigastric region. His pain usually lasts about 30 minutes and nothing he has tried for pain has helped. He has some intermittent nausea associated with the pain but has not had to take any zofran that he is prescribed. He has had a good appetite and with diuresis clinic as well as his daily diuretics he has been urinating well. He has had two episodes of diarrhea recently but last BM this morning was normal. No black or bloody stools. He started diuresis clinic about 2-3 weeks ago and has been going twice weekly. He does say that he feels his swelling and shortness of breath is improved after initiation of diuresis clinic.    Designated Environmental health practitioner:  Jeremy Oliver currently has decisional capacity for healthcare decision-making and is able to designate a surrogate Environmental health practitioner. Jeremy Oliver designated healthcare decision maker(s) is/are [name] Franchini (Spouse) & Bridgette Coss (Daughter) as denoted by stated patient preference.    Allergies:  Losartan and Opioids - morphine analogues    Medications:   Prior to Admission medications    Medication Dose, Route, Frequency   acetaminophen (TYLENOL) 500 MG tablet TAKE 2 TABLETS (1 000MG ) BY MOUTH EVERY 8 HOURS AS NEEDED FOR PAIN   albuterol HFA 90 mcg/actuation inhaler Inhale 2 puffs into the lungs every 6 (six) hours as needed for wheezing or shortness of breath.   alirocumab (PRALUENT) 150 mg/mL subcutaneous injection Inject the contents of 1 pen (150 mg total) under the skin every fourteen (14) days.   allopurinoL (ZYLOPRIM) 100 MG tablet 200 mg, Oral, Daily (standard)   amiodarone (PACERONE) 200 MG tablet TAKE 1 TABLET BY MOUTH ONCE DAILY   amLODIPine (NORVASC) 10 MG tablet 10 mg, Oral, Daily (standard)   atorvastatin (LIPITOR) 80 MG tablet 80 mg, Oral, Daily (standard)   bumetanide (BUMEX) 2 MG tablet 5 mg, Oral, 2 times a day   carvediloL (COREG) 25 MG tablet 37.5 mg, Oral, 2 times a day (standard)   cholecalciferol, vitamin D3, 5,000 unit capsule 5,000 Units, Oral, Daily (standard)   ezetimibe (ZETIA) 10 mg tablet TAKE 1 TABLET BY MOUTH ONCE DAILY   FLUoxetine (PROZAC) 40 MG capsule 80 mg, Oral, Daily (standard)   gabapentin (NEURONTIN) 300 MG capsule 300 mg, Oral, 2 times a day   hydrOXYzine (ATARAX) 10 MG tablet 10 mg, Oral, Nightly PRN   insulin ASPART (NOVOLOG FLEXPEN U-100 INSULIN) 100 unit/mL (3 mL) injection pen 22 Units, Subcutaneous, 3 times a day (with meals)   insulin detemir U-100 (LEVEMIR) 100 unit/mL (3 mL) injection pen Inject 45 Units total under the skin nightly.   isosorbide mononitrate (IMDUR) 30 MG 24 hr tablet 120 mg, Oral, Daily (standard)   loperamide (IMODIUM) 2 mg capsule 2 mg, Oral, 4 times daily PRN   nitroglycerin (NITROSTAT) 0.4 MG SL tablet 0.4 mg, Sublingual, Every 5 min  PRN   omeprazole (PRILOSEC) 40 MG capsule 40 mg, Oral, 2 times a day (AC)   ondansetron (ZOFRAN) 4 MG tablet 4 mg, Oral, Every 8 hours PRN   ondansetron (ZOFRAN) 4 MG tablet 4 mg, Oral, Every 8 hours PRN   rivaroxaban (XARELTO) 15 mg Tab 15 mg, Oral, Daily   semaglutide 0.25 mg or 0.5 mg(2 mg/1.5 mL) PnIj 0.5 mg, Subcutaneous, Every 7 days   sevelamer (RENVELA) 800 mg tablet TAKE 2 TABLETS (1600MG ) BY MOUTH THREE TIMES DAILY WITH MEALS   ticagrelor (BRILINTA) 90 mg Tab TAKE 1 TABLET BY MOUTH TWICE DAILY   blood sugar diagnostic Strp Disp to match current ins approved meter. Use to test blood sugar four times daily. E11.22, Z79.4   blood-glucose meter kit Disp. blood glucose meter kit preferred by patient's insurance. Dx: Diabetes, E11.9   diclofenac sodium (VOLTAREN) 1 % gel Apply 2 grams topically Three (3) times a day.   insulin syringe-needle U-100 0.3 mL 31 gauge x 5/16 Syrg Use to inject insulin twice daily with meals   lancets Misc Disp. Ins preferred lancets. Use to test blood sugar four times daily. E11.22, Z79.4   miscellaneous medical supply (BLOOD PRESSURE CUFF) Misc Measure BP once a day or if any symptoms   sodium polystyrene, SPS, with sorbitol (SPS, WITH SORBITOL,) 15-20 gram/60 mL Susp Take 60 mL (15 g) by mouth once a week.  Patient not taking: Reported on 07/25/2019   UNIFINE PENTIPS 31 gauge x 5/16 (8 mm) Ndle USE WITH INSULIN AND VICTOZA 5 TIMES DAILY   liraglutide (VICTOZA 2-PAK) 0.6 mg/0.1 mL (18 mg/3 mL) injection 0.6 mg, Subcutaneous, Daily   ranolazine (RANEXA) 500 MG 12 hr tablet Oral, 2 times a day   traZODone (DESYREL) 50 MG tablet 50 mg, Oral, Nightly       Medical History:  Past Medical History:   Diagnosis Date   ??? Angina at rest (CMS-[name])    ??? Arthritis    ??? Asthma    ??? Atrial fibrillation and flutter (CMS-[name]) 06/08/14   ??? BMI 40.0-44.9, adult (CMS-[name]) 10/29/2015   ??? Chronic anticoagulation 02/17/2018   ??? Chronic kidney disease, stage 3    ??? Chronic pain syndrome 04/11/2014    ~Use daily lyrica as recommended ~Tramadol as needed for pain ~Fluoxetine daily as recommended ~Daily exercise ~Eat a healthy diet ~Good control of your blood sugars can help    ??? Coronary artery disease    ??? Depression    ??? Diabetes mellitus (CMS-[name])    ??? Eye trauma 1998    glass in both eyes and removed   ??? GERD (gastroesophageal reflux disease)    ??? Gout    ??? Homeless    ??? Hyperlipidemia    ??? Hypertension    ??? Illiteracy and low-level literacy    ??? Microscopic hematuria    ??? Migraine    ??? Nephrolithiasis    ??? 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 Nonalcoholic fatty liver disease    ??? NSTEMI (non-ST elevated myocardial infarction) (CMS-[name]) 01/13/2017   ??? Obesity    ???  Obstructive sleep apnea 2009   ??? Panic attacks    ??? 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 Seizure-like activity (CMS-[name])     ~Monitor for symptoms and report them to your primary care provider if they occur    ??? Systolic CHF, chronic (CMS-[name])     EF 40-45% 2017   ??? TIA (transient ischemic attack)        Surgical History:  Past Surgical History:   Procedure Laterality Date   ??? CARDIAC CATHETERIZATION  03/08/2007   ??? CORONARY STENT PLACEMENT     ??? CYSTOURETHROSCOPY  12/31/2009     Microscopic hematuria   ??? KNEE SURGERY Right 09/23/2006    arthroscopic knee surgery medial and lateral meniscus tears as well as crystal disease   ??? PR CATH PLACE/CORON ANGIO, IMG SUPER/INTERP,R&L HRT CATH, L HRT VENTRIC N/A 02/13/2017    Procedure: Left/Right Heart Catheterization;  Surgeon: Marlaine Hind, MD;  Location: [facility] CATH;  Service: Cardiology   ??? PR CATH PLACE/CORON ANGIO, IMG SUPER/INTERP,W LEFT HEART VENTRICULOGRAPHY N/A 03/17/2017    Procedure: Left Heart Catheterization W Intervention;  Surgeon: Marlaine Hind, MD;  Location: [facility] CATH;  Service: Cardiology   ??? PR CATH PLACE/CORON ANGIO, IMG SUPER/INTERP,W LEFT HEART VENTRICULOGRAPHY N/A 05/12/2018    Procedure: CATH LEFT HEART CATHETERIZATION W INTERVENTION;  Surgeon: Dorathy Kinsman, MD;  Location: [facility] CATH;  Service: Cardiology   ??? PR CATH PLACE/CORON ANGIO, IMG SUPER/INTERP,W LEFT HEART VENTRICULOGRAPHY N/A 10/22/2018    Procedure: CATH LEFT HEART CATHETERIZATION W INTERVENTION;  Surgeon: Marlaine Hind, MD;  Location: [facility] CATH;  Service: Cardiology   ??? PR ECMO/ECLS INITIATION VENO-ARTERIAL N/A 03/19/2017    Procedure: ECMO/ECLS; INITIATION, VENO-ARTERIAL;  Surgeon: Lennie Odor, MD;  Location: MAIN OR [facility];  Service: Cardiac Surgery   ??? PR ECMO/ECLS RMVL PRPH CANNULA OPEN 6 YRS & OLDER Left 03/25/2017    Procedure: ECMO / ECLS PROVIDED BY PHYSICIAN; REMOVAL OF PERIPHERAL (ARTERIAL AND/OR VENOUS) CANNULA(E), OPEN, 6 YEARS AND OLDER;  Surgeon: Alonna Buckler Ikonomidis, MD;  Location: MAIN OR [facility];  Service: Cardiac Surgery   ??? PR EPHYS EVAL W/ ABLATION SUPRAVENT ARRHYTHMIA N/A 07/19/2015    Catheter ablation of cavotricuspid isthmus for atrial flutter [facility])   ??? PR INSER HART PACER XVENOUS ATRIAL N/A 05/30/2015    [employer] dual-chamber pacemaker implant (E.Chung)   ??? PR INSERT/PLACE FLOW DIRECT CATH N/A 03/17/2017    Procedure: Insert Leave In [place];  Surgeon: Marlaine Hind, MD;  Location: [facility] CATH;  Service: Cardiology   ??? PR INSJ/RPLCMT PERM DFB W/TRNSVNS LDS 1/DUAL CHMBR N/A 05/04/2017    Procedure: ICD Implant System (Single/Dual);  Surgeon: Eldred Manges, MD;  Location: [facility] CATH;  Service: Cardiology   ??? PR NEGATIVE PRESSURE WOUND THERAPY DME >50 SQ CM Left 03/25/2017    Procedure: Neg Press Wound Tx (Vac Assist) Incl Topicals, Per Session, Tsa Greater Than/= 50 Cm Squared;  Surgeon: Alonna Buckler Ikonomidis, MD;  Location: MAIN OR [facility];  Service: Cardiac Surgery   ??? PR PRQ TRLUML CORONARY STENT W/ANGIO ONE ART/BRNCH N/A 03/12/2018    Procedure: Percutaneous Coronary Intervention;  Surgeon: Marlaine Hind, MD;  Location: [facility] CATH;  Service: Cardiology   ??? PR REBL VES DIRECT,LOW EXTREM Left 03/19/2017    Procedure: Repr Bld Vessel Direct; Lower Extrem;  Surgeon: Boykin Reaper, MD;  Location: MAIN OR [facility];  Service: Vascular   ??? PR REMV ART CLOT ILIAC-POP,LEG INCIS Left 03/25/2017    Procedure:  EMBOLECTOMY OR THROMBECTOMY, WITH OR WITHOUT CATHETER; FEMOROPOPLITEAL, AORTOILIAC ARTERY, BY LEG INCISION;  Surgeon: Alonna Buckler Ikonomidis, MD;  Location: MAIN OR [facility];  Service: Cardiac Surgery   ??? PR UPPER GI ENDOSCOPY,BIOPSY N/A 02/28/2016    Procedure: UGI ENDOSCOPY; WITH BIOPSY, SINGLE OR MULTIPLE;  Surgeon: Liane Comber, MD;  [facility]: [facility];  Service: Gastroenterology   ??? PR UPPER GI ENDOSCOPY,DIAGNOSIS N/A 05/07/2016    Procedure: UGI ENDO, INCLUDE ESOPHAGUS, STOMACH, & DUODENUM &/OR JEJUNUM; DX W/WO COLLECTION SPECIMN, BY BRUSH OR WASH;  Surgeon: Modena Nunnery, MD;  [facility]: [facility];  Service: Gastroenterology       Social History: Social History     Socioeconomic History   ??? Marital status: Married     Spouse name: [name]   ??? Number of children: 3   ??? Years of education: 43   ??? Highest education level: Not on file   Occupational History   ??? Occupation: DISABLED     Comment: Former IT sales professional   Tobacco Use   ??? Smoking status: Never Smoker   ??? Smokeless tobacco: Current User     Types: Chew   ??? Tobacco comment: Pt in process of reducing tobacco use,  dips 1 can every 2 weeks,   Substance and Sexual Activity   ??? Alcohol use: Never     Alcohol/week: 0.0 standard drinks     Comment: rare use: couple times a year, small amount   ??? Drug use: No   ??? Sexual activity: Yes     Partners: Female   Other Topics Concern   ??? Do you use sunscreen? No   ??? Tanning bed use? No   ??? Are you easily burned? No   ??? Excessive sun exposure? No   ??? Blistering sunburns? No   Social History Narrative    Information updated:  01/08/16. Reviewed 08/12/17, 12/01/18:        Born in [place]    Raised with both parents    Parents died w/in 6 mos of each other. His parents knew they were going to pass and told him just before.    Half sister and 2 half brothers        Married 30 yrs. Wife [name]    2 other children son and daughter, adopted out    Daughter Bridgette, bf DJ        Education - completed through 11th. 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    Former Presenter, broadcasting since age 31    Lives with wife, difficulty paying bills 12/30/17     Lives next door to daughter, husband and their grandson        05/19/18: pt moving to new home in next 5 days- after an eviction. Has Medicaid assistance again. Will be w/ wife [name], they babysit their grandson often.    06/14/18: Pt and husband living with daughter while new location to live is determined.    12/01/18: pt living with wife and friends. Friends have two young children. [name] states soon they will have their own place, however doesn't know when. Pt states it will be after the neighbors are evicted, we can move in. Pt will need to start dialysis soon. Pt and wife Not talking with daughter and grandson  lately.      Social Determinants of Health     Financial Resource Strain:    ??? Difficulty of Paying Living Expenses:    Food Insecurity: Food Insecurity Present   ??? Worried About Programme researcher, broadcasting/film/video in the Last Year: Sometimes true   ??? Ran Out of Food in the Last Year: Sometimes true   Transportation Needs:    ??? Lack of Transportation (Medical):    ??? Lack of Transportation (Non-Medical):    Physical Activity:    ??? Days of Exercise per Week:    ??? Minutes of Exercise per Session:    Stress:    ??? Feeling of Stress :    Social Connections:    ??? Frequency of Communication with Friends and Family:    ??? Frequency of Social Gatherings with Friends and Family:    ??? Attends Religious Services:    ??? Database administrator or Organizations:    ??? Attends Banker Meetings:    ??? Marital Status:         Family History:  Family History   Problem Relation Age of Onset   ??? Diabetes Mother    ??? Hyperlipidemia Mother    ??? Heart disease Mother    ??? Basal cell carcinoma Mother    ??? Blindness Mother         diabeties   ??? Obesity Mother    ??? Hyperlipidemia Father    ??? Heart disease Father    ??? Lung disease Father    ??? Heart disease Half-Sister    ??? Kidney disease Half-Sister    ??? Gallbladder disease Half-Brother    ??? Obesity Half-Brother    ??? No Known Problems Daughter    ??? No Known Problems Son    ??? Obesity Daughter    ??? Melanoma Neg Hx    ??? Squamous cell carcinoma Neg Hx    ??? Macular degeneration Neg Hx    ??? Glaucoma Neg Hx        Review of Systems:  Review of Systems   Constitutional: Positive for chills. Negative for fever.   Eyes: Negative for blurred vision.   Respiratory: Negative for cough and wheezing.    Cardiovascular: Positive for chest pain, orthopnea and leg swelling.   Gastrointestinal: Positive for abdominal pain, diarrhea and nausea. Negative for blood in stool, constipation, melena and vomiting.   Genitourinary: Negative for dysuria and urgency.   Neurological: Positive for dizziness.   Psychiatric/Behavioral: The patient is nervous/anxious.        Objective:   Physical Exam:  Temp:  [36 ??C] 36 ??C  Heart Rate:  [84-86] 86  Resp:  [16] 16  BP: (126-147)/(60-77) 126/60  SpO2:  [97 %-98 %] 97 %    Gen: WDWN, obese in NAD, alert, oriented, answers questions appropriately  HEENT: atraumatic, normocephalic, sclera anicteric, MMM  Neck: no cervical lymphadenopathy or thyromegaly  Heart: RRR, S1, S2, no M/R/G, no chest wall tenderness  Lungs: CTAB, +crackles   Abdomen: Normoactive bowel sounds, soft, +TTP epigastric  Extremities: no clubbing, cyanosis, 2-3+ pitting edema BLEs, symmetric  Neuro: CN II-XI grossly intact. No focal deficits.  Skin:  No rashes, lesions on clothed exam  Psych: Normal mood and affect.     Labs/Studies:  Labs and Studies from the last 24hrs per EMR and Reviewed    Imaging: Radiology studies were personally reviewed

## 2019-07-25 NOTE — Unmapped (Signed)
[place] [facility] Note    Primary Cardiologist/Cardiology Provider(s):  Dr. Hoy Finlay  PCP:  Kurtis Bushman, MD    Last Cardiology Clinic Visit:  07/14/2019  Last IV Diuresis Visit: 07/22/2019    Reason for Visit:  Jeremy Oliver is a 49 y.o. male with a history of combined systolic and diastolic heart failure, who is being seen today in the [facility] IV [facility] Clinic for an urgent visit for volume status optimization including IV diuresis.    Assessment/Plan:  1. Acute on Chronic combined systolic and diastolic heart failure, acute on chronic kidney disease  - Volume status today is unchanged. 388 --> 379 --> 380.5 --> 380 --> 383 -->373.8 --> 367.4 --> 363  - NYHA Class II-III  - IV Lasix 160 mg x1  was administered during clinic with good response  - Labs were obtained: Cr 5.8, K 5.3  - his living situation is likely contributing to his inability to be compliant with diet and weight monitoring; just recently moved into housing  - continue Bumex 5 mg twice daily. Continue to work on reducing sodium.   - Plan for direct admission; return Thursday afternoon if no bed    Response to IV diuresis:         Medications Heart rate Blood pressure Weight (lbs)   Pre  84 147/77 363   Hour 0 IV Lasix 160 mg      Hour 2 IV Lasix 0 mg      Post    362.4     Output:  I/O       04/24 0701 - 04/25 0700 04/25 0701 - 04/26 0700 04/26 0701 - 04/27 0700    Urine (mL/kg/hr)   700    Total Output(mL/kg)   700 (4.3)    Net   -700               Return in about 3 days (around 07/28/2019).    Future Appointments   Date Time Provider Department Center   07/26/2019  8:15 AM [facility] EP REMOTE MONITORING [facility]   07/28/2019  9:00 AM DIURESIS HF NP [place] [facility]   08/02/2019 12:00 PM Pallav Carlean Purl, MD NEUR [facility]   08/03/2019  8:20 AM Roma Schanz, MD [facility]   08/03/2019  9:00 AM Mila Merry, MD [facility]   01/16/2020  8:30 AM Monica Martinez, MD [facility]       History of Present Illness:  Jeremy Oliver is a 49 y.o. male with a history of combined systolic and diastolic heart failure who presents today for IV diuresis. He also has a history of significant CAD. Patient last seen in clinic on 06/29/2019. Since that time, he has gained the 4 pounds he lost last week during IV diuresis and gained an additional 2 pounds.  He was seen in the IV diuresis clinic on 06/29/2019 and was given furosemide 160 mg IV x2.  He had a good response despite his chronic kidney disease.  He had an improvement in symptoms.      He was seen in follow-up 07/04/19. He noted more orthopnea over the past 2 nights despite being on BiPAP.  He was seen by Dr. Cherly Hensen in nephrology that morning and was referred back to IV diuresis.  He had generally not been weighing at home and endorsed shortness of breath with walking short distances.  He was able to walk  from his car in the parking deck to the clinic lobby but needed a wheelchair to get the rest of the way into the clinic.  He had lower extremity edema to his knee.  He was taking Bumex 5 mg twice daily. Over the weekend he ate a slice of pizza that was supreme as well as have a salad with ranch dressing.  He is drinking several sodas.  He did not start hydralazine as recommended last week. He had good rsponse to IV Lasix 160mg  x1.     He was seen 4/7 for follow-up. His weight was down. He said he felt pretty good - was able to walk into the building. He was urinating a lot with the Bumex.  He was instructed to take metolazone 2.5mg  x1 that morning.     He was last seen 4/9 and felt about the same as he did at his previous visit. His breathing was fine; he was able to walk into clinic today without much difficulty. He and his wife found housing, put a deposit down w/ plans to move in the following weekend. He was not given IV Lasix due to stability of weights and worsening Cr to 5.9 after taking metolazone 2.5mg  x1. He was instructed not to repeat metolazone over the weekend.    He had repeat labs which showed Cr 5.92 and K 6.3. he was instructed to take SPS x3 days in a row and scheduled for f/u in Diuresis Clinic. He discussed dialysis w/ his nephrologist but stated he really wanted to wait to start until he was in stable housing. Last seen 4/15 and weight was stable and he felt about the same as last visit. His LE edema was stable. Breathing was OK, he was able to walk in without much trouble. He was sleeping fine without orthopnea. He had tried to make some diet changes; when they had chinese food, he only used 1 packet of soy sauce instead of several. He was diuresed with IV Lasix 160mg  x1.    07/19/2019 he presents for follow-up. He has been planning direct admission w/ his nephrologist to start dialysis, but there is no open bed yet. He feels his LE edema has improved. He was again able to walk in without a wheelchair. He isn't sleeping well, feels like he is drowning when he lays back. He was previously sleeping on a couch and now that he has moved into housing, he is sleeping in a bed. He felt better when he laid on his side.     He was last seen 4/21 for follow-up of diuresis clinic visit the day prior. Weight down ~3 lbs. He reported feeling better and stated that he had great output throughout the evening. However, he was unable to walk into clinic due to leg weakness and needed his wife to push him in a wheelchair.  Pt endorsed breathing better and was able to sleep some last night on his side. He had a decreased appetite and ate half a bowl of raisin bran for dinner.    07/22/2019 he presented for follow-up. Weight was down from last visit again. He walked in, did not need to use the wheelchair. He said he was feeling frustrated because he received a call that a bed was ready for him, but then got another call saying it was a mistake. He just wanted to get it over with.    Today he presents for follow-up assessment of fluid volume status. Weight decreased from last visit  with symptom improvement. He reports watching his salt intake and limiting fluids to less than two liter per day. Patient was able to walk into clinic today without difficulty. Patient and wife are concerned about bed availability to start process for dialysis.     Past Medical History:   Diagnosis Date   ??? Angina at rest (CMS-[name])    ??? Arthritis    ??? Asthma    ??? Atrial fibrillation and flutter (CMS-[name]) 06/08/14   ??? BMI 40.0-44.9, adult (CMS-[name]) 10/29/2015   ??? Chronic anticoagulation 02/17/2018   ??? Chronic kidney disease, stage 3    ??? Chronic pain syndrome 04/11/2014    ~Use daily lyrica as recommended ~Tramadol as needed for pain ~Fluoxetine daily as recommended ~Daily exercise ~Eat a healthy diet ~Good control of your blood sugars can help    ??? Coronary artery disease    ??? Depression    ??? Diabetes mellitus (CMS-[name])    ??? Eye trauma 1998    glass in both eyes and removed   ??? GERD (gastroesophageal reflux disease)    ??? Gout    ??? Homeless    ??? Hyperlipidemia    ??? Hypertension    ??? Illiteracy and low-level literacy    ??? Microscopic hematuria    ??? Migraine    ??? Nephrolithiasis    ??? 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 Nonalcoholic fatty liver disease    ??? NSTEMI (non-ST elevated myocardial infarction) (CMS-[name]) 01/13/2017   ??? Obesity    ??? Obstructive sleep apnea 2009   ??? Panic attacks    ??? 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 Seizure-like activity (CMS-[name])     ~Monitor for symptoms and report them to your primary care provider if they occur ??? Systolic CHF, chronic (CMS-[name])     EF 40-45% 2017   ??? TIA (transient ischemic attack)      Past Surgical History:   Procedure Laterality Date   ??? CARDIAC CATHETERIZATION  03/08/2007   ??? CORONARY STENT PLACEMENT     ??? CYSTOURETHROSCOPY  12/31/2009     Microscopic hematuria   ??? KNEE SURGERY Right 09/23/2006    arthroscopic knee surgery medial and lateral meniscus tears as well as crystal disease   ??? PR CATH PLACE/CORON ANGIO, IMG SUPER/INTERP,R&L HRT CATH, L HRT VENTRIC N/A 02/13/2017    Procedure: Left/Right Heart Catheterization;  Surgeon: Marlaine Hind, MD;  Location: [facility] CATH;  Service: Cardiology   ??? PR CATH PLACE/CORON ANGIO, IMG SUPER/INTERP,W LEFT HEART VENTRICULOGRAPHY N/A 03/17/2017    Procedure: Left Heart Catheterization W Intervention;  Surgeon: Marlaine Hind, MD;  Location: [facility] CATH;  Service: Cardiology   ??? PR CATH PLACE/CORON ANGIO, IMG SUPER/INTERP,W LEFT HEART VENTRICULOGRAPHY N/A 05/12/2018    Procedure: CATH LEFT HEART CATHETERIZATION W INTERVENTION;  Surgeon: Dorathy Kinsman, MD;  Location: [facility] CATH;  Service: Cardiology   ??? PR CATH PLACE/CORON ANGIO, IMG SUPER/INTERP,W LEFT HEART VENTRICULOGRAPHY N/A 10/22/2018    Procedure: CATH LEFT HEART CATHETERIZATION W INTERVENTION;  Surgeon: Marlaine Hind, MD;  Location: [facility] CATH;  Service:  Cardiology   ??? PR ECMO/ECLS INITIATION VENO-ARTERIAL N/A 03/19/2017    Procedure: ECMO/ECLS; INITIATION, VENO-ARTERIAL;  Surgeon: Lennie Odor, MD;  Location: MAIN OR [facility];  Service: Cardiac Surgery   ??? PR ECMO/ECLS RMVL PRPH CANNULA OPEN 6 YRS & OLDER Left 03/25/2017    Procedure: ECMO / ECLS PROVIDED BY PHYSICIAN; REMOVAL OF PERIPHERAL (ARTERIAL AND/OR VENOUS) CANNULA(E), OPEN, 6 YEARS AND OLDER;  Surgeon: Alonna Buckler Ikonomidis, MD;  Location: MAIN OR [facility];  Service: Cardiac Surgery   ??? PR EPHYS EVAL W/ ABLATION SUPRAVENT ARRHYTHMIA N/A 07/19/2015    Catheter ablation of cavotricuspid isthmus for atrial flutter [facility])   ??? PR INSER HART PACER XVENOUS ATRIAL N/A 05/30/2015    [employer] dual-chamber pacemaker implant (E.Chung)   ??? PR INSERT/PLACE FLOW DIRECT CATH N/A 03/17/2017    Procedure: Insert Leave In John Sevier;  Surgeon: Marlaine Hind, MD;  Location: [facility] CATH;  Service: Cardiology   ??? PR INSJ/RPLCMT PERM DFB W/TRNSVNS LDS 1/DUAL CHMBR N/A 05/04/2017    Procedure: ICD Implant System (Single/Dual);  Surgeon: Eldred Manges, MD;  Location: [facility] CATH;  Service: Cardiology   ??? PR NEGATIVE PRESSURE WOUND THERAPY DME >50 SQ CM Left 03/25/2017    Procedure: Neg Press Wound Tx (Vac Assist) Incl Topicals, Per Session, Tsa Greater Than/= 50 Cm Squared;  Surgeon: Alonna Buckler Ikonomidis, MD;  Location: MAIN OR [facility];  Service: Cardiac Surgery   ??? PR PRQ TRLUML CORONARY STENT W/ANGIO ONE ART/BRNCH N/A 03/12/2018    Procedure: Percutaneous Coronary Intervention;  Surgeon: Marlaine Hind, MD;  Location: [facility] CATH;  Service: Cardiology   ??? PR REBL VES DIRECT,LOW EXTREM Left 03/19/2017    Procedure: Repr Bld Vessel Direct; Lower Extrem;  Surgeon: Boykin Reaper, MD;  Location: MAIN OR [facility];  Service: Vascular   ??? PR REMV ART CLOT ILIAC-POP,LEG INCIS Left 03/25/2017    Procedure: EMBOLECTOMY OR THROMBECTOMY, WITH OR WITHOUT CATHETER; FEMOROPOPLITEAL, AORTOILIAC ARTERY, BY LEG INCISION;  Surgeon: Alonna Buckler Ikonomidis, MD;  Location: MAIN OR [facility];  Service: Cardiac Surgery   ??? PR UPPER GI ENDOSCOPY,BIOPSY N/A 02/28/2016    Procedure: UGI ENDOSCOPY; WITH BIOPSY, SINGLE OR MULTIPLE;  Surgeon: Liane Comber, MD;  [facility];  Service: Gastroenterology   ??? PR UPPER GI ENDOSCOPY,DIAGNOSIS N/A 05/07/2016    Procedure: UGI ENDO, INCLUDE ESOPHAGUS, STOMACH, & DUODENUM &/OR JEJUNUM; DX W/WO COLLECTION SPECIMN, BY BRUSH OR WASH;  Surgeon: Modena Nunnery, MD;  [facility]: [facility];  Service: Gastroenterology       Current Outpatient Medications   Medication Sig Dispense Refill   ??? acetaminophen (TYLENOL) 500 MG tablet TAKE 2 TABLETS (1 000MG ) BY MOUTH EVERY 8 HOURS AS NEEDED FOR PAIN 180 each 3   ??? albuterol HFA 90 mcg/actuation inhaler Inhale 2 puffs into the lungs every 6 (six) hours as needed for wheezing or shortness of breath. 18 g 2   ??? alirocumab (PRALUENT) 150 mg/mL subcutaneous injection Inject the contents of 1 pen (150 mg total) under the skin every fourteen (14) days. 6 mL 3   ??? allopurinoL (ZYLOPRIM) 100 MG tablet Take 2 tablets (200 mg total) by mouth daily. 60 tablet 5   ??? amiodarone (PACERONE) 200 MG tablet TAKE 1 TABLET BY MOUTH ONCE DAILY 90 tablet 3   ??? amLODIPine (NORVASC) 10 MG tablet Take 1 tablet (10 mg total) by mouth daily. 90 tablet 3   ??? atorvastatin (LIPITOR) 80 MG tablet Take 1 tablet (80 mg total) by  mouth daily. 90 tablet 3   ??? blood sugar diagnostic Strp Disp to match current ins approved meter. Use to test blood sugar four times daily. E11.22, Z79.4 400 each 3   ??? blood-glucose meter kit Disp. blood glucose meter kit preferred by patient's insurance. Dx: Diabetes, E11.9 1 each 11   ??? bumetanide (BUMEX) 2 MG tablet Take 2.5 tablets (5 mg total) by mouth two (2) times a day. 450 tablet 3   ??? carvediloL (COREG) 25 MG tablet Take 1.5 tablets (37.5 mg total) by mouth Two (2) times a day. 270 tablet 3   ??? cholecalciferol, vitamin D3, 5,000 unit capsule Take 1 capsule (5,000 Units total) by mouth daily. 100 capsule 0   ??? diclofenac sodium (VOLTAREN) 1 % gel Apply 2 grams topically Three (3) times a day. 180 g 11   ??? ezetimibe (ZETIA) 10 mg tablet TAKE 1 TABLET BY MOUTH ONCE DAILY 90 each 3   ??? FLUoxetine (PROZAC) 40 MG capsule Take 2 capsules (80 mg total) by mouth daily. 180 capsule 3   ??? gabapentin (NEURONTIN) 300 MG capsule Take 1 capsule (300 mg total) by mouth two (2) times a day. 180 capsule 3   ??? hydrOXYzine (ATARAX) 10 MG tablet Take 1 tablet (10 mg total) by mouth nightly as needed for anxiety. 30 tablet 0   ??? insulin ASPART (NOVOLOG FLEXPEN U-100 INSULIN) 100 unit/mL (3 mL) injection pen Inject 0.22 mL (22 Units total) under the skin Three (3) times a day with a meal. 15 mL 11   ??? insulin detemir U-100 (LEVEMIR) 100 unit/mL (3 mL) injection pen Inject 45 Units total under the skin nightly. 15 mL 3   ??? insulin syringe-needle U-100 0.3 mL 31 gauge x 5/16 Syrg Use to inject insulin twice daily with meals 60 each 0   ??? isosorbide mononitrate (IMDUR) 30 MG 24 hr tablet Take 4 tablets (120 mg total) by mouth daily. 120 tablet 3   ??? lancets Misc Disp. Ins preferred lancets. Use to test blood sugar four times daily. E11.22, Z79.4 400 each 3   ??? loperamide (IMODIUM) 2 mg capsule Take 1 capsule (2 mg total) by mouth 4 (four) times a day as needed for diarrhea. 30 capsule 0   ??? miscellaneous medical supply (BLOOD PRESSURE CUFF) Misc Measure BP once a day or if any symptoms 1 each 0   ??? nitroglycerin (NITROSTAT) 0.4 MG SL tablet Place 1 tablet (0.4 mg total) under the tongue every five (5) minutes as needed for chest pain. 100 each 3   ??? omeprazole (PRILOSEC) 40 MG capsule Take 1 capsule (40 mg total) by mouth Two (2) times a day (30 minutes before a meal). 180 capsule 3   ??? ondansetron (ZOFRAN) 4 MG tablet Take 1 tablet (4 mg total) by mouth every eight (8) hours as needed for nausea for up to 4 days. 10 tablet 0   ??? ondansetron (ZOFRAN) 4 MG tablet Take 1 tablet (4 mg total) by mouth every eight (8) hours as needed for nausea. 30 tablet 0   ??? rivaroxaban (XARELTO) 15 mg Tab Take 1 tablet (15 mg total) by mouth daily with evening meal. 90 tablet 3   ??? semaglutide 0.25 mg or 0.5 mg(2 mg/1.5 mL) PnIj Inject 0.5 mg under the skin every seven (7) days. 4.5 mL 3   ??? sevelamer (RENVELA) 800 mg tablet TAKE 2 TABLETS (1600MG ) BY MOUTH THREE TIMES DAILY WITH MEALS 540 tablet 3   ??? sodium polystyrene,  SPS, with sorbitol (SPS, WITH SORBITOL,) 15-20 gram/60 mL Susp Take 60 mL (15 g) by mouth once a week. 473 mL 3   ??? ticagrelor (BRILINTA) 90 mg Tab TAKE 1 TABLET BY MOUTH TWICE DAILY 180 tablet 3   ??? UNIFINE PENTIPS 31 gauge x 5/16 (8 mm) Ndle USE WITH INSULIN AND VICTOZA 5 TIMES DAILY 400 each 3     No current facility-administered medications for this visit.     Allergies   Allergen Reactions   ??? Losartan      Hyperkalemia (K>6)   ??? Opioids - Morphine Analogues Anxiety       Objective:       Physical Exam  BP 147/77  - Pulse 84  - Wt (!) 164.4 kg (362 lb 6.4 oz)  - SpO2 98%  - BMI 46.53 kg/m??    Wt Readings from Last 12 Encounters:   07/25/19 (!) 164.4 kg (362 lb 6.4 oz)   07/22/19 (!) 166 kg (366 lb)   07/20/19 (!) 169.6 kg (373 lb 12.8 oz)   07/19/19 (!) 171 kg (377 lb)   07/15/19 (!) 173.7 kg (383 lb)   07/14/19 (!) 171.7 kg (378 lb 9.6 oz)   07/14/19 (!) 172.4 kg (380 lb)   07/08/19 (!) 172.6 kg (380 lb 8 oz)   07/06/19 (!) 171.9 kg (378 lb 14.4 oz)   07/04/19 (!) 174.1 kg (383 lb 12.8 oz)   07/04/19 (!) 176.6 kg (389 lb 4.8 oz)   06/29/19 (!) 172.8 kg (380 lb 14.4 oz)       General:  Patient is chronically ill-appearing in no acute distress   Eyes:  Intact, sclerae anicteric.   Ears, nose, mouth: Benign   Respiratory:   Normal respiratory effort. Clear to auscultation bilaterally.  There are no wheezes.   Cardiovascular:  JVP not seen above the clavicle with HOB at 90 degrees.  Rate and rhythm are regular.  There is no lifts or heaves.  Normal S1, S2. There is no murmur, gallops or rubs, Radial and pedal pulses are 1-2+, bilaterally.   There is 1- 2+ pitting pedal/pretibial edema, bilaterally.   Gastrointestinal:   Soft, non-tender, with audible bowel sounds. Abdomen nondistended.  Liver is nonpalpable.   Musculoskeletal: No joint swelling   Skin: Warm, well perfused.   Neurologic: Appropriate mood and affect. Alert and oriented to person, place, and time. No gross motor or sensory deficits evident.     Recent Labs:  Office Visit on 07/25/2019   Component Date Value Ref Range Status   ??? Sodium, POCT 07/25/2019 134* 135 - 145 mmol/L Final   ??? Potassium, POCT 07/25/2019 5.3* 3.5 - 5.0 mmol/L Final   ??? Chloride, POCT 07/25/2019 103  98 - 107 mmol/L Final   ??? CO2, POCT 07/25/2019 19* 22 - 30 mmol/L Final   ??? BUN, POCT 07/25/2019 56* 7 - 21 mg/dL Final   ??? Creatinine, POCT 07/25/2019 5.8* 0.7 - 1.3 mg/dL Final   ??? Glucose, POCT 07/25/2019 212* 70 - 179 mg/dL Final   ??? Calcium, POCT 07/25/2019 8.6  8.5 - 10.2 mg/dL Final   Office Visit on 07/22/2019   Component Date Value Ref Range Status   ??? Magnesium 07/22/2019 1.9  1.6 - 2.6 mg/dL Final   ??? Sodium, POCT 07/22/2019 136  135 - 145 mmol/L Final   ??? Potassium, POCT 07/22/2019 5.5* 3.5 - 5.0 mmol/L Final   ??? Chloride, POCT 07/22/2019 100  98 - 107 mmol/L Final   ???  CO2, POCT 07/22/2019 22  22 - 30 mmol/L Final   ??? BUN, POCT 07/22/2019 57* 7 - 21 mg/dL Final   ??? Creatinine, POCT 07/22/2019 6.2* 0.7 - 1.3 mg/dL Final   ??? Glucose, POCT 07/22/2019 223* 70 - 179 mg/dL Final   ??? Calcium, POCT 07/22/2019 8.1* 8.5 - 10.2 mg/dL Final   Office Visit on 07/20/2019   Component Date Value Ref Range Status   ??? Magnesium 07/20/2019 1.7  1.6 - 2.6 mg/dL Final   ??? PRO-BNP 16/12/9602 6,530.0* 0.0 - 138.0 pg/mL Final   ??? Sodium, POCT 07/20/2019 134* 135 - 145 mmol/L Final   ??? Potassium, POCT 07/20/2019 5.0  3.5 - 5.0 mmol/L Final   ??? Chloride, POCT 07/20/2019 98  98 - 107 mmol/L Final   ??? CO2, POCT 07/20/2019 24  22 - 30 mmol/L Final   ??? BUN, POCT 07/20/2019 55* 7 - 21 mg/dL Final   ??? Creatinine, POCT 07/20/2019 5.9* 0.7 - 1.3 mg/dL Final   ??? Glucose, POCT 07/20/2019 214* 70 - 179 mg/dL Final   ??? Calcium, POCT 07/20/2019 8.2* 8.5 - 10.2 mg/dL Final   Office Visit on 07/19/2019   Component Date Value Ref Range Status   ??? Magnesium 07/19/2019 1.9  1.6 - 2.6 mg/dL Final   ??? Sodium, POCT 07/19/2019 140  135 - 145 mmol/L Final   ??? Potassium, POCT 07/19/2019 5.9* 3.5 - 5.0 mmol/L Final   ??? Chloride, POCT 07/19/2019 102  98 - 107 mmol/L Final   ??? CO2, POCT 07/19/2019 25  22 - 30 mmol/L Final   ??? BUN, POCT 07/19/2019 54* 7 - 21 mg/dL Final   ??? Creatinine, POCT 07/19/2019 5.8* 0.7 - 1.3 mg/dL Final   ??? Glucose, POCT 07/19/2019 250* 70 - 179 mg/dL Final   ??? Calcium, POCT 07/19/2019 8.3* 8.5 - 10.2 mg/dL Final       Lab Results   Component Value Date    PRO-BNP 6,530.0 (H) 07/20/2019    PRO-BNP 5,060.0 (H) 07/14/2019    PRO-BNP 4,050.0 (H) 07/08/2019    PRO-BNP 5,610.0 (H) 07/06/2019    PRO-BNP 5,180.0 (H) 07/04/2019    PRO-BNP 231 (H) 06/08/2014    Creatinine, POCT 5.8 (H) 07/25/2019    Creatinine, POCT 6.2 (H) 07/22/2019    Creatinine, POCT 5.9 (H) 07/20/2019    Creatinine, POCT 5.8 (H) 07/19/2019    Creatinine, POCT 5.6 (H) 07/14/2019    BUN, POCT 56 (H) 07/25/2019    BUN, POCT 57 (H) 07/22/2019    BUN, POCT 55 (H) 07/20/2019    BUN, POCT 54 (H) 07/19/2019    BUN, POCT 51 (H) 07/14/2019    Magnesium 1.9 07/22/2019    Magnesium 1.7 07/20/2019    Magnesium 1.9 07/19/2019    Magnesium 1.9 07/14/2019    Magnesium 1.8 07/08/2019    Magnesium 2.0 06/09/2014    Magnesium 1.7 05/12/2011    Magnesium 1.8 01/09/2011       Metric Tracker:  Did today's visit result in ED visit? No  Did today's visit result in hospital admission? No  Did today's visit result in referral to cardiology? n/a  Today's visit was a referral from Nephrology

## 2019-07-25 NOTE — Unmapped (Signed)
IV Diuresis Clinic Discharge Instructions:    MEDICATIONS:  NO medication changes today.    Call if you have questions about your medications.    IMPORTANT INSTRUCTIONS:  - Weigh yourself daily in the morning; write your weight down and bring it with you next time   - Limit your fluid intake to 2 Liters (half-gallon) per day    - Limit your salt intake to 2-3 grams (2000-3000 mg) per day  - If you have a morning diuresis appointment, don't take your morning diuretic  - If you have an afternoon diuresis appointment, take your morning diuretic    LABS:  We will call you if your labs need attention    NEXT APPOINTMENT:  Return to clinic in 3 days with IV Diuresis - Thursday at [facility]    The Cardiology Clinic has moved. Our new address is [address] [place], [place] [address], 2nd floor.     My office number (c/o De Burrs RN) is [phone] if you need further assistance, or need to schedule with our IV Diuresis clinic in the future.    If you need to reschedule future appointments, please call [phone] or [phone]. After office hours, if you have urgent questions/problems, contact the on-call cardiologist through the hospital operator: [phone].

## 2019-07-26 LAB — BASIC METABOLIC PANEL
ANION GAP: 14 mmol/L (ref 7–15)
ANION GAP: 15 mmol/L (ref 7–15)
BLOOD UREA NITROGEN: 57 mg/dL — ABNORMAL HIGH (ref 7–21)
BLOOD UREA NITROGEN: 58 mg/dL — ABNORMAL HIGH (ref 7–21)
BUN / CREAT RATIO: 9
CALCIUM: 8.2 mg/dL — ABNORMAL LOW (ref 8.5–10.2)
CALCIUM: 8.3 mg/dL — ABNORMAL LOW (ref 8.5–10.2)
CHLORIDE: 104 mmol/L (ref 98–107)
CHLORIDE: 104 mmol/L (ref 98–107)
CO2: 20 mmol/L — ABNORMAL LOW (ref 22.0–30.0)
CO2: 19 mmol/L — ABNORMAL LOW (ref 22.0–30.0)
CREATININE: 6.55 mg/dL — ABNORMAL HIGH (ref 0.70–1.30)
CREATININE: 6.45 mg/dL — ABNORMAL HIGH (ref 0.70–1.30)
EGFR CKD-EPI AA MALE: 10 mL/min/{1.73_m2} — ABNORMAL LOW (ref >=60)
EGFR CKD-EPI AA MALE: 11 mL/min/{1.73_m2} — ABNORMAL LOW (ref >=60)
EGFR CKD-EPI NON-AA MALE: 9 mL/min/{1.73_m2} — ABNORMAL LOW (ref >=60)
GLUCOSE RANDOM: 162 mg/dL (ref 70–179)
POTASSIUM: 5.5 mmol/L — ABNORMAL HIGH (ref 3.5–5.0)
SODIUM: 138 mmol/L (ref 135–145)
SODIUM: 138 mmol/L (ref 135–145)

## 2019-07-26 LAB — HEPATITIS B CORE IGM ANTIBODY: Hepatitis B virus core Ab.IgM:PrThr:Pt:Ser:Ord:: NONREACTIVE

## 2019-07-26 LAB — CBC
HEMATOCRIT: 34.7 % — ABNORMAL LOW (ref 41.0–53.0)
HEMOGLOBIN: 10.3 g/dL — ABNORMAL LOW (ref 13.5–17.5)
MEAN CORPUSCULAR HEMOGLOBIN CONC: 29.7 g/dL — ABNORMAL LOW (ref 31.0–37.0)
MEAN CORPUSCULAR HEMOGLOBIN: 25.5 pg — ABNORMAL LOW (ref 26.0–34.0)
MEAN CORPUSCULAR VOLUME: 85.8 fL (ref 80.0–100.0)
MEAN PLATELET VOLUME: 8.7 fL (ref 7.0–10.0)
RED BLOOD CELL COUNT: 4.04 10*12/L — ABNORMAL LOW (ref 4.50–5.90)
RED CELL DISTRIBUTION WIDTH: 15.8 % — ABNORMAL HIGH (ref 12.0–15.0)

## 2019-07-26 LAB — PROTIME: Coagulation tissue factor induced:Time:Pt:PPP:Qn:Coag: 15.2 — ABNORMAL HIGH

## 2019-07-26 LAB — HEPATITIS C ANTIBODY
HEPATITIS C ANTIBODY: NONREACTIVE
Hepatitis C virus Ab:PrThr:Pt:Ser:Ord:: NONREACTIVE

## 2019-07-26 LAB — EGFR CKD-EPI AA MALE
Glomerular filtration rate/1.73 sq M.predicted.black:ArVRat:Pt:Ser/Plas/Bld:Qn:Creatinine-based formula (CKD-EPI): 10 — ABNORMAL LOW

## 2019-07-26 LAB — HEPATITIS B SURFACE ANTIGEN: Hepatitis B virus surface Ag:PrThr:Pt:Ser:Ord:: NONREACTIVE

## 2019-07-26 LAB — PHOSPHORUS: Phosphate:MCnc:Pt:Ser/Plas:Qn:: 7.2 — ABNORMAL HIGH

## 2019-07-26 LAB — MAGNESIUM: Magnesium:MCnc:Pt:Ser/Plas:Qn:: 1.9

## 2019-07-26 LAB — PLATELET COUNT: Platelets:NCnc:Pt:Bld:Qn:Automated count: 338

## 2019-07-26 LAB — CREATININE: Creatinine:MCnc:Pt:Ser/Plas:Qn:: 6.45 — ABNORMAL HIGH

## 2019-07-26 LAB — HEPATITIS B SURFACE ANTIBODY: Hepatitis B virus surface Ab:PrThr:Pt:Ser:Ord:: NONREACTIVE

## 2019-07-26 LAB — HEPATITIS B CORE TOTAL ANTIBODY: Hepatitis B virus core Ab:PrThr:Pt:Ser/Plas:Ord:IA: NONREACTIVE

## 2019-07-26 LAB — HIV ANTIGEN/ANTIBODY COMBO

## 2019-07-26 LAB — PROTIME-INR: PROTIME: 15.2 s — ABNORMAL HIGH (ref 10.5–13.5)

## 2019-07-26 NOTE — Unmapped (Signed)
Inpatient Tobacco Cessation Counseling Note    This medical encounter was conducted virtually using [email or web address]  TeleHealth protocols.    I have identified myself to the patient and conveyed my credentials to Mr. Viscardi  I have explained the capabilities and limitations of telemedicine and the patient/proxy and myself both agree that it is appropriate for their current circumstances/symptoms.     Contact Information  Person Contacted: Shawn Route Votta         Contact Phone number: [phone]      Phone Outcome: Spoke with pt  Is there someone else in the room? Yes. What is your relationship? Wife. Do you want this person here for the visit? Yes.    Patient's location at the time of the telephone visit: Hospitalized at [facility]   Provider's location at the time of the telephone visit: At home, in [place]      Purpose of contact:     Pt participated in a telephone visit for tobacco cessation counseling.  Patient was admitted to hospital for new start dialysis. Patient consented to telephone visit given due to social isolation measures in place due to the COVID-19 pandemic.     Tobacco Use History and Assessment  Time Since Last Tobacco Use: 1 to 7 days ago  Tobacco Withdrawal (Past 24 Hours): Desire or craving to smoke  Type of Tobacco Products Used: Smokeless  Quantity Used: 1.5 (cans)  Quantity Per: month  Other Household Members Use Tobacco: No  Previously seen by NDP?: Hospital Inpatient    Behavioral Assessment  Why Uses: 1. habit 2. stress-relief 3. something to do  Reasons to Become Tobacco Free: health  Barriers/Challenges: 1. long-standing habit 2. stress        NOTE: Pt continues to use smokeless tobacco and has reduced his use from 1 can/2 weeks to 1can/3 weeks. Pt's goal remains to cut back to 1 can every month and he is working toward that. He endorses cravings and is receptive to using nicotine gum while here. SW provided education on the effectiveness of using patches + gum together but pt declines patch for now. SW also provided information on how nicotine impacts kidney function. SW provided pt with contact information, physical improvements related to tobacco cessation, and available resource (including outpt Tobacco Treatment Program at [facility] and [facility]).    Treatment Plan  Please see below for medication recommendations in bold.   Cessation Meds Currently Using: None  Medications Recommended During Hospitalization: Gum 4mg  (declines patch)  Outpatient/Discharge Medications Recommended: Gum 4mg   Plan to Obtain Outpatient Meds: TTS messaged providers for Rx  Patient's Plan Post Discharge/Visit: Reduce tobacco use             As part of this Telephone Visit, no in-person exam was conducted.     I personally spent 5 minutes counseling the patient via telephone about tobacco cessation.  I spent an additional 6 minutes on pre- and post-visit activities.      The patient was physically located in [place] or a state in which I am permitted to provide care. The patient and/or parent/guardian understood that s/he Casto incur co-pays and cost sharing, and agreed to the telemedicine visit. The visit was reasonable and appropriate under the circumstances given the patient's presentation at the time.     The patient and/or parent/guardian has been advised of the potential risks and limitations of this mode of treatment (including, but not limited to, the absence of in-person  examination) and has agreed to be treated using telemedicine. The patient's/patient's family's questions regarding telemedicine have been answered.      If the visit was completed in an ambulatory setting, the patient and/or parent/guardian has also been advised to contact their provider???s office for worsening conditions, and seek emergency medical treatment and/or call 911 if the patient deems either necessary.     Visit Format/Coding: Telephone     Coding: 09811 (11-20 minutes)  Service rendered over the phone most consistent with: Tobacco cessation counseling, greater than 10 minutes (91478)     Sara Chu, LCSW, LCAS, NCTTP  Clinical Social Worker / Tobacco Treatment Specialist  Tobacco Treatment Program  [facility]  phone: [phone]  pager: [phone]

## 2019-07-26 NOTE — Unmapped (Signed)
OCCUPATIONAL THERAPY  Evaluation (Co-eval with Alphonsa Gin PT for safe mobility progression) (07/26/19 0809)    Patient Name:  Jeremy Oliver       Medical Record Number: [id number]   Date of Birth: [birth year]  Sex: Male          OT Treatment Diagnosis:  Decreased activity tolerance and balance        Problem List: Impaired balance, Decreased endurance, Decreased mobility, Fall Risk, Impaired ADLs    Assessment: Jeremy Oliver is a 49 y.o. male with PMHx of CKD, CAD, AICD, DM, HTN, HLD, HFrEF (EF 35% w G1DD), TIA, A fib on xarelto that presented as a direct admission for new start dialysis in setting of refractory volume overload.    Pt presents to OT with above defiits impacting pt's independence with ADLs and functional mobility/transfers compared to baseline.  Pt completing OOB mobility with HHA, limited 2/2 c/o back pain and dizziness this date. Pt reaching out for therapist and wall during hallway mobility without DME with SBA, pt suddenly endorsing back pain; reoriented to [facility] and pt completing remainder of mobility back to room with CGA via HHA. Pt exhibiting ability to reach B feet with figure 8 seated EOB for LB ADLs. After review of the patient's occupational profile and history, assessment of occupational performance, clinical decision making, and development of POC, the patient presents as a moderate complexity case.     Today's Interventions: Pt educated on role of OT, POC, bed mobility, cognitive assessment, LBD activities, UBD activities, sitting tolerance/balance, functional transfers, functional mobility without DME, orthostatic precautions, PLB techniques, grooming, educ on benefits of OOB.    Activity Tolerance During Today's Session  Patient tolerated treatment well, Other, Patient limited by pain (Limited by c/o back pain and dizziness despite VSS)    Plan  Planned Frequency of Treatment:  1-2x per day for: 2-3x week       Planned Interventions:  ADL retraining, Education - Family / caregiver, Neuromuscular re-education, Therapeutic exercise, Transfer training, Postular / Proximal stability, Endurance activities, Balance activities, UE Strength / coordination exercise, Positioning, Bed mobility, Compensatory tech. training, Functional mobility, Visual / perceptual tasks, Safety education, Home exercise program, Conservation, Education - Patient    Post-Discharge Occupational Therapy Recommendations:  OT Post Acute Discharge Recommendations: 3x weekly   OT DME Recommendations: Shower chair with back    GOALS:   Patient and Family Goals: To go home    Long Term Goal #1: Pt will score 24/24 on AMPAC in 4 weeks.       Short Term:  Pt will complete toilet transfer and toileting with MOD I.   Time Frame : 2 weeks  Pt will complete functional mobility to gather ADL items with MOD I.   Time Frame : 2 weeks  Pt will complete >5 minute standing ADL with MOD I.   Time Frame : 2 weeks     Prognosis:  Good  Positive Indicators:  Age, PLOF  Barriers to Discharge: Pain, Endurance deficits, Other (dizziness)    Subjective  Current Status Pt received/left semi-reclined in bed, all needs within reach and RN Huili aware  Prior Functional Status Pt reports being independent with ADLs, cooking, and wife cleans, driving, and using motorized cart at grocery stores. Pt endorses 1 fall ~ 1 month ago when his dog pulled him and he fell. Pt endorses increased SOB with mobility (50ft) over past 2 months.    Medical Tests / Procedures: Reviewed in EPIC  Patient / Caregiver reports: Oh I can't, my back!    Past Medical History:   Diagnosis Date   ??? Angina at rest (CMS-[name])    ??? Arthritis    ??? Asthma    ??? Atrial fibrillation and flutter (CMS-[name]) 06/08/14   ??? BMI 40.0-44.9, adult (CMS-[name]) 10/29/2015   ??? Chronic anticoagulation 02/17/2018   ??? Chronic kidney disease, stage 3    ??? Chronic pain syndrome 04/11/2014    ~Use daily lyrica as recommended ~Tramadol as needed for pain ~Fluoxetine daily as recommended ~Daily exercise ~Eat a healthy diet ~Good control of your blood sugars can help    ??? Coronary artery disease    ??? Depression    ??? Diabetes mellitus (CMS-[name])    ??? Eye trauma 1998    glass in both eyes and removed   ??? GERD (gastroesophageal reflux disease)    ??? Gout    ??? Homeless    ??? Hyperlipidemia    ??? Hypertension    ??? Illiteracy and low-level literacy    ??? Microscopic hematuria    ??? Migraine    ??? Nephrolithiasis    ??? 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 Nonalcoholic fatty liver disease    ??? NSTEMI (non-ST elevated myocardial infarction) (CMS-[name]) 01/13/2017   ??? Obesity    ??? Obstructive sleep apnea 2009   ??? Panic attacks    ??? 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 Seizure-like activity (CMS-[name])     ~Monitor for symptoms and report them to your primary care provider if they occur    ??? Systolic CHF, chronic (CMS-[name])     EF 40-45% 2017   ??? TIA (transient ischemic attack)     Social History     Tobacco Use   ??? Smoking status: Never Smoker   ??? Smokeless tobacco: Current User     Types: Chew   ??? Tobacco comment: Pt in process of reducing tobacco use,  dips 1 can every 2 weeks,   Substance Use Topics   ??? Alcohol use: Never     Alcohol/week: 0.0 standard drinks     Comment: rare use: couple times a year, small amount      Past Surgical History:   Procedure Laterality Date   ??? CARDIAC CATHETERIZATION  03/08/2007   ??? CORONARY STENT PLACEMENT     ??? CYSTOURETHROSCOPY  12/31/2009     Microscopic hematuria   ??? KNEE SURGERY Right 09/23/2006    arthroscopic knee surgery medial and lateral meniscus tears as well as crystal disease   ??? PR CATH PLACE/CORON ANGIO, IMG SUPER/INTERP,R&L HRT CATH, L HRT VENTRIC N/A 02/13/2017    Procedure: Left/Right Heart Catheterization;  Surgeon: Marlaine Hind, MD;  Location: [facility] CATH;  Service: Cardiology   ??? PR CATH PLACE/CORON ANGIO, IMG SUPER/INTERP,W LEFT HEART VENTRICULOGRAPHY N/A 03/17/2017    Procedure: Left Heart Catheterization W Intervention;  Surgeon: Marlaine Hind, MD;  Location: [facility] CATH;  Service: Cardiology   ??? PR CATH PLACE/CORON ANGIO, IMG SUPER/INTERP,W LEFT HEART VENTRICULOGRAPHY N/A 05/12/2018    Procedure: CATH LEFT HEART  CATHETERIZATION W INTERVENTION;  Surgeon: Dorathy Kinsman, MD;  Location: [facility] CATH;  Service: Cardiology   ??? PR CATH PLACE/CORON ANGIO, IMG SUPER/INTERP,W LEFT HEART VENTRICULOGRAPHY N/A 10/22/2018    Procedure: CATH LEFT HEART CATHETERIZATION W INTERVENTION;  Surgeon: Marlaine Hind, MD;  Location: [facility] CATH;  Service: Cardiology   ??? PR ECMO/ECLS INITIATION VENO-ARTERIAL N/A 03/19/2017    Procedure: ECMO/ECLS; INITIATION, VENO-ARTERIAL;  Surgeon: Lennie Odor, MD;  Location: [facility]IN OR [facility];  Service: Cardiac Surgery   ??? PR ECMO/ECLS RMVL PRPH CANNULA OPEN 6 YRS & OLDER Left 03/25/2017    Procedure: ECMO / ECLS PROVIDED BY PHYSICIAN; REMOVAL OF PERIPHERAL (ARTERIAL AND/OR VENOUS) CANNULA(E), OPEN, 6 YEARS AND OLDER;  Surgeon: Alonna Buckler Ikonomidis, MD;  Location: MAIN OR [facility];  Service: Cardiac Surgery   ??? PR EPHYS EVAL W/ ABLATION SUPRAVENT ARRHYTHMIA N/A 07/19/2015    Catheter ablation of cavotricuspid isthmus for atrial flutter [facility])   ??? PR INSER HART PACER XVENOUS ATRIAL N/A 05/30/2015    [employer] dual-chamber pacemaker implant (E.Chung)   ??? PR INSERT/PLACE FLOW DIRECT CATH N/A 03/17/2017    Procedure: Insert Leave In Oriental;  Surgeon: Marlaine Hind, MD;  Location: [facility] CATH;  Service: Cardiology   ??? PR INSJ/RPLCMT PERM DFB W/TRNSVNS LDS 1/DUAL CHMBR N/A 05/04/2017    Procedure: ICD Implant System (Single/Dual);  Surgeon: Eldred Manges, MD;  Location: [facility] CATH;  Service: Cardiology   ??? PR NEGATIVE PRESSURE WOUND THERAPY DME >50 SQ CM Left 03/25/2017    Procedure: Neg Press Wound Tx (Vac Assist) Incl Topicals, Per Session, Tsa Greater Than/= 50 Cm Squared;  Surgeon: Alonna Buckler Ikonomidis, MD;  Location: [facility] OR [facility];  Service: Cardiac Surgery   ??? PR PRQ TRLUML CORONARY STENT W/ANGIO ONE ART/BRNCH N/A 03/12/2018    Procedure: Percutaneous Coronary Intervention;  Surgeon: Marlaine Hind, MD;  Location: [facility] CATH;  Service: Cardiology   ??? PR REBL VES DIRECT,LOW EXTREM Left 03/19/2017    Procedure: Repr Bld Vessel Direct; Lower Extrem;  Surgeon: Boykin Reaper, MD;  Location: MAIN OR [facility];  Service: Vascular   ??? PR REMV ART CLOT ILIAC-POP,LEG INCIS Left 03/25/2017    Procedure: EMBOLECTOMY OR THROMBECTOMY, WITH OR WITHOUT CATHETER; FEMOROPOPLITEAL, AORTOILIAC ARTERY, BY LEG INCISION;  Surgeon: Alonna Buckler Ikonomidis, MD;  Location: MAIN OR [facility];  Service: Cardiac Surgery   ??? PR UPPER GI ENDOSCOPY,BIOPSY N/A 02/28/2016    Procedure: UGI ENDOSCOPY; WITH BIOPSY, SINGLE OR MULTIPLE;  Surgeon: Liane Comber, MD;  [facility];  Service: Gastroenterology   ??? PR UPPER GI ENDOSCOPY,DIAGNOSIS N/A 05/07/2016    Procedure: UGI ENDO, INCLUDE ESOPHAGUS, STOMACH, & DUODENUM &/OR JEJUNUM; DX W/WO COLLECTION SPECIMN, BY BRUSH OR WASH;  Surgeon: Modena Nunnery, MD;  [facility]: [facility];  Service: Gastroenterology    Family History   Problem Relation Age of Onset   ??? Diabetes Mother    ??? Hyperlipidemia Mother    ??? Heart disease Mother    ??? Basal cell carcinoma Mother    ??? Blindness Mother         diabeties   ??? Obesity Mother    ??? Hyperlipidemia Father    ??? Heart disease Father    ??? Lung disease Father    ??? Heart disease Half-Sister    ??? Kidney disease Half-Sister    ??? Gallbladder disease Half-Brother    ??? Obesity Half-Brother    ??? No Known Problems Daughter ??? No Known Problems Son    ???  Obesity Daughter    ??? Melanoma Neg Hx    ??? Squamous cell carcinoma Neg Hx    ??? Macular degeneration Neg Hx    ??? Glaucoma Neg Hx         Losartan and Opioids - morphine analogues     Objective Findings  Precautions / Restrictions  Falls precautions    Weight Bearing  Non-applicable    Required Braces or Orthoses  Non-applicable    Communication Preference  Verbal    Pain  Pt endorses mild back pain initially, endorses severe back pain with mobility--pt did not rate. RN made aware    Equipment / Environment  Patient not wearing mask for full session, Caregiver wearing mask for full session, Vascular access (PIV, TLC, Port-a-cath, PICC)    Living Situation  Living Environment: House  Lives With: Spouse  Home Living: One level home, Stairs to enter without rails, Standard height toilet, Walk-in shower  Number of Stairs: 1     Cognition   Orientation Level:  Oriented x 4   Arousal/Alertness:  Appropriate responses to stimuli   Attention Span:  Appears intact   Memory:  Appears intact   Following Commands:  Follows all commands and directions without difficulty   Safety Judgment:  Decreased awareness of need for safety   Awareness of Errors:  Good awareness of safety precautions   Problem Solving:  Able to problem solve independently     Vision / Perception    Hearing: WFL   Vision: Wears glasses for reading only        Hand Function  B hands WFL    Skin Inspection  Visible skin appears intact    ROM / Strength/Coordination  UE ROM/ Strength/ Coordination: BUE WFL  LE ROM/ Strength/ Coordination: BLE WFL    Sensation:  Endorses numbness along L side of low back/buttock and L knee->distally, R hand N/T    Balance:  Seated=indep, standing=CGA with HHA    Functional Mobility  Transfer Assistance Needed: Yes (Sit-stand=CGA, functional mobility=SBA->CGA 2/2 pt reaching out for therapist and wall--endorsing back pain and need for assistance--completed remainder of walk back to room with CGA/HHA) Bed Mobility Assistance Needed: No      ADLs  ADLs: Needs assistance with ADLs  ADLs - Needs Assistance: Feeding, Grooming, Bathing, Toileting, UB dressing, LB dressing  Feeding - Needs Assistance:  (Independent)  Grooming - Needs Assistance:  (Independent)  Bathing - Needs Assistance: Min assist  Toileting - Needs Assistance:  (CGA)  UB Dressing - Needs Assistance:  (Independent)  LB Dressing - Needs Assistance:  (SBA)      Vitals / Orthostatics  At Rest: NAD  With Activity: HR=88, 122/78  Orthostatics: Pt endorses dizziness, however, VSS---pt immediately conversational and no s/s once back supine      Medical Staff Made Aware: RN Huili      Occupational Therapy Session Duration  OT Individual - Duration: 40         I attest that I have reviewed the above information.  Signed: Caleen Essex, OT  Filed 07/26/2019

## 2019-07-26 NOTE — Unmapped (Signed)
Palliative Care Consult Note    Consultation from Requesting Attending Physician:  Sharlett Iles, MD  Service Requesting Consult:  Nephrology (MDB)  Reason for Consult Request from Attending Physician:  Evaluation of Symptoms, Goals of Care / Decision Making and Patient and Family Support  Primary Care Provider:  Kurtis Bushman, MD      Code Status: Full Code   Healthcare decision-maker if lacks capacity:    [name] [facility]): Toppin,[name] - [phone]    [name], First Alternate: Ducharme,Bridgette - Daughter - [phone]   Advance directives: completed and on file    Assessment/Plan:      SUMMARY:  [name] is a very pleasant 49 y.o. 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 He is well known to this Clinical research associate as he is a patient of the [facility]).    Palliative care is consulted for ongoing assistance with GOC, support and symptom evaluation.      Symptom Assessment and Recommendations:    1. Acute on chronic low back pain.   --states he slipped the other day and ever since that time his back has been aching more than usual.   --REC scheduled acetaminophen 1000mg  PO TID and heating pad to low back prn. He [place] also apply voltaren gel (already ordered) to low back but NOT simultaneously with heating pad.    Communication and ACP:    Decisional capacity at time of visit: Mr. Sibilia remains engaged in decision making regarding his medical care.     Health Care Decision Makers  ????Primary??[name]: Blank,[name] - [phone]  ????[facility] [name]: Ellerson, [name] - [phone]  Living Will: Completed and on file 02/06/2017      Current goals of care:     --Will initiate dialysis likely 4/28 after line placement    --[name] remains FULL CODE but has previously shared that he considered foregoing aggressive resuscitation efforts. He shared that his wife and daughter were not in favor of foregoing aggressive measures and as such he has decided to accept full code.     --Escalation of care as needed to prolong life although [name] has been very clear, in the presence of his wife, that he does NOT want to be prolonged in a vegetative state or in a state of suffering. Do not prolong dying process. He states he would not wish to live his life dependent upon machines and unable to interact with his surroundings.     He hopes to have more time to spend with his family and his dog. He loves his dog so much and looks forward to opening his eyes to see her every day.??He has also had hopes to return to fishing and hunting but now thinks this Wilsey not be a possibility. He is also hopeful to travel to [place] to see his daughter.     Practical, Emotional, Spiritual Support / Other Communication and Counseling:    Palliative care follow up with [name] and his wife [name] today. We had recently visited in [facility] after [name] completed therapy at the diuresis clinic at [facility]. He had continued to visit the [facility] for GOC and to consider his treatment options for his kidney disease as well as overall GOC.     [name] has continued to express a sense of relief that providers can also emotionally  support his wife as he anticipates she Quang need support of her own, especially if things do not work out as well as they are hoping.   ??  [name] has strong faith in God. He prays to God to have more time to watch his grandson grow up. He also shares his belief that God will take him when it is time to go and he does not want that plan interrupted at the hands of medical intervention. He expresses gratitude to God that he has lived the life he has, that he has repaired his relationship with his wife and that he has a daughter and grandchild which mean so much to him.    Both Mr and Mrs Jeremy Oliver verbalize appreciation for ongoing support of palliative care. They anticipate additional support needs as he initiates dialysis and works toward incorporating dialysis in to his life.     --Communication with attending team and primary nephrologist regarding [name] care.     Thank you for this consult. We will continue to follow along with you. Please page [facility] [phone]) or Palliative Care [phone]) if there are any questions.     Subjective:   I am so tired but at least we're finally here. My back hurts a good bit. I slipped the other day and it has been more aggravating since then.     Denies nausea, vomiting or other symptoms at this time. States he is often so tired that he drifts off to sleep during conversation.     HPI:  Mr. Jeremy Jeremy Oliver is a very pleasant 49 y.o. 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 He is well known to this Clinical research associate as he is a patient of the [facility]).    [name] renal function has been deteriorating quite rapidly in recent weeks. He has become quite symptomatic especially with regard to dyspnea and volume overload. He has been relying on a wheelchair to get around now that his symptoms are worsening. He has been visiting the diuresis clinic at least weekly to help offload volume while he awaiting an inpatient bed. He has reported ongoing nausea, intermittently vomiting, poor appetite, progressive fatigue, DOE, three pillow orthopnea and tremendous swelling. He continues to have intermittent chest pain but has not required any nitroglycerin in the last few weeks.     Symptom Severity and Assessment:     Pain severity and assessment: does not rate, mild to moderate, low back, deep aching  Shortness of Breath: comfortable at rest, severe DOE  Nausea/Vomiting: intermittent moderate nausea, no emesis. Poor appetite  Constipation: denies  Sleep: impacted by orthopnea but now becoming more sleepy during the day, falling asleep during conversation  Anxiety:0  Depression:0  Delirium, Hypoactive: 0  Delirium, Hyperactive:0  Other: pruritus, mild and self limiting    Allergies:  Allergies   Allergen Reactions   ??? Losartan      Hyperkalemia (K>6)   ??? Opioids - Morphine Analogues Anxiety     Medications:  Scheduled Meds:  ??? allopurinoL  100 mg Oral BID   ??? amiodarone  200 mg Oral Nightly   ??? amLODIPine  10 mg Oral Nightly   ??? atorvastatin  80 mg Oral Daily   ??? bumetanide  5 mg Oral BID   ??? carvediloL  37.5 mg Oral BID   ??? cholecalciferol (vitamin D3-125 mcg (5,000  unit))  25 mcg Oral Daily   ??? ezetimibe  10 mg Oral Nightly   ??? FLUoxetine  80 mg Oral Nightly   ??? gabapentin  300 mg Oral BID   ??? heparin (porcine) for subcutaneous use  5,000 Units Subcutaneous [facility]   ??? insulin glargine  35 Units Subcutaneous Nightly   ??? insulin lispro  0-12 Units Subcutaneous ACHS   ??? insulin lispro  10 Units Subcutaneous TID AC   ??? isosorbide mononitrate  60 mg Oral BID   ??? magnesium oxide  400 mg Oral Daily   ??? pantoprazole  40 mg Oral Daily   ??? sevelamer  1,600 mg Oral 3xd Meals   ??? ticagrelor  90 mg Oral BID     Continuous Infusions:  PRN Meds:.acetaminophen, albumin human, dextrose 50 % in water (D50W), diclofenac sodium, epoetin alfa-EPBX, gentamicin-sodium citrate, gentamicin-sodium citrate, hydrOXYzine, nicotine polacrilex, ondansetron, sodium chloride 0.9%, sodium ferric gluconate IVPB     Past Medical History:   Diagnosis Date   ??? Angina at rest (CMS-[name])    ??? Arthritis    ??? Asthma    ??? Atrial fibrillation and flutter (CMS-[name]) 06/08/14   ??? BMI 40.0-44.9, adult (CMS-[name]) 10/29/2015   ??? Chronic anticoagulation 02/17/2018   ??? Chronic kidney disease, stage 3    ??? Chronic pain syndrome 04/11/2014    ~Use daily lyrica as recommended ~Tramadol as needed for pain ~Fluoxetine daily as recommended ~Daily exercise ~Eat a healthy diet ~Good control of your blood sugars can help    ??? Coronary artery disease    ??? Depression    ??? Diabetes mellitus (CMS-[name])    ??? Eye trauma 1998    glass in both eyes and removed   ??? GERD (gastroesophageal reflux disease)    ??? Gout    ??? Homeless    ??? Hyperlipidemia    ??? Hypertension    ??? Illiteracy and low-level literacy    ??? Microscopic hematuria    ??? Migraine    ??? Nephrolithiasis    ??? 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 Nonalcoholic fatty liver disease    ??? NSTEMI (non-ST elevated myocardial infarction) (CMS-[name]) 01/13/2017   ??? Obesity    ??? Obstructive sleep apnea 2009   ??? Panic attacks    ??? 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 Seizure-like activity (CMS-[name])     ~Monitor for symptoms and report them to your primary care provider if they occur    ??? Systolic CHF, chronic (CMS-[name])     EF 40-45% 2017   ??? TIA (transient ischemic attack)        Past Surgical History:   Procedure Laterality Date   ??? CARDIAC CATHETERIZATION  03/08/2007   ??? CORONARY STENT PLACEMENT     ??? CYSTOURETHROSCOPY  12/31/2009     Microscopic hematuria   ??? KNEE SURGERY Right 09/23/2006    arthroscopic knee surgery medial and lateral meniscus tears as well as crystal disease   ??? PR CATH PLACE/CORON ANGIO, IMG SUPER/INTERP,R&L HRT CATH, L HRT VENTRIC N/A 02/13/2017    Procedure:  Left/Right Heart Catheterization;  Surgeon: Marlaine Hind, MD;  Location: [facility] CATH;  Service: Cardiology   ??? PR CATH PLACE/CORON ANGIO, IMG SUPER/INTERP,W LEFT HEART VENTRICULOGRAPHY N/A 03/17/2017    Procedure: Left Heart Catheterization W Intervention;  Surgeon: Marlaine Hind, MD;  Location: [facility] CATH;  Service: Cardiology   ??? PR CATH PLACE/CORON ANGIO, IMG SUPER/INTERP,W LEFT HEART VENTRICULOGRAPHY N/A 05/12/2018    Procedure: CATH LEFT HEART CATHETERIZATION W INTERVENTION;  Surgeon: Dorathy Kinsman, MD;  Location: [facility] CATH;  Service: Cardiology   ??? PR CATH PLACE/CORON ANGIO, IMG SUPER/INTERP,W LEFT HEART VENTRICULOGRAPHY N/A 10/22/2018    Procedure: CATH LEFT HEART CATHETERIZATION W INTERVENTION;  Surgeon: Marlaine Hind, MD;  Location: [facility] CATH;  Service: Cardiology   ??? PR ECMO/ECLS INITIATION VENO-ARTERIAL N/A 03/19/2017    Procedure: ECMO/ECLS; INITIATION, VENO-ARTERIAL;  Surgeon: Lennie Odor, MD;  Location: [facility]IN OR [facility];  Service: Cardiac Surgery   ??? PR ECMO/ECLS RMVL PRPH CANNULA OPEN 6 YRS & OLDER Left 03/25/2017    Procedure: ECMO / ECLS PROVIDED BY PHYSICIAN; REMOVAL OF PERIPHERAL (ARTERIAL AND/OR VENOUS) CANNULA(E), OPEN, 6 YEARS AND OLDER;  Surgeon: Alonna Buckler Ikonomidis, MD;  Location: [facility];  Service: Cardiac Surgery   ??? PR EPHYS EVAL W/ ABLATION SUPRAVENT ARRHYTHMIA N/A 07/19/2015    Catheter ablation of cavotricuspid isthmus for atrial flutter [facility])   ??? PR INSER HART PACER XVENOUS ATRIAL N/A 05/30/2015    [employer] dual-chamber pacemaker implant (E.Chung)   ??? PR INSERT/PLACE FLOW DIRECT CATH N/A 03/17/2017    Procedure: Insert Leave In [place];  Surgeon: Marlaine Hind, MD;  Location: [facility] CATH;  Service: Cardiology   ??? PR INSJ/RPLCMT PERM DFB W/TRNSVNS LDS 1/DUAL CHMBR N/A 05/04/2017    Procedure: ICD Implant System (Single/Dual);  Surgeon: Eldred Manges, MD;  Location: [facility] CATH;  Service: Cardiology   ??? PR NEGATIVE PRESSURE WOUND THERAPY DME >50 SQ CM Left 03/25/2017    Procedure: Neg Press Wound Tx (Vac Assist) Incl Topicals, Per Session, Tsa Greater Than/= 50 Cm Squared;  Surgeon: Alonna Buckler Ikonomidis, MD;  Location: [facility] OR [facility];  Service: Cardiac Surgery   ??? PR PRQ TRLUML CORONARY STENT W/ANGIO ONE ART/BRNCH N/A 03/12/2018    Procedure: Percutaneous Coronary Intervention;  Surgeon: Marlaine Hind, MD;  Location: [facility] CATH;  Service: Cardiology   ??? PR REBL VES DIRECT,LOW EXTREM Left 03/19/2017    Procedure: Repr Bld Vessel Direct; Lower Extrem;  Surgeon: Felicie Kocher Reaper, MD;  Location: MA[facility] OR [facility];  Service: Vascular   ??? PR REMV ART CLOT ILIAC-POP,LEG INCIS Left 03/25/2017    Procedure: EMBOLECTOMY OR THROMBECTOMY, WITH OR WITHOUT CATHETER; FEMOROPOPLITEAL, AORTOILIAC ARTERY, BY LEG INCISION;  Surgeon: Alonna Buckler Ikonomidis, MD;  Location: MAIN OR [facility];  Service: Cardiac Surgery   ??? PR UPPER GI ENDOSCOPY,BIOPSY N/A 02/28/2016    Procedure: UGI ENDOSCOPY; WITH BIOPSY, SINGLE OR MULTIPLE;  Surgeon: Liane Comber, MD;  [facility];  Service: Gastroenterology   ??? PR UPPER GI ENDOSCOPY,DIAGNOSIS N/A 05/07/2016    Procedure: UGI ENDO, INCLUDE ESOPHAGUS, STOMACH, & DUODENUM &/OR JEJUNUM; DX W/WO COLLECTION SPECIMN, BY BRUSH OR WASH;  Surgeon: Modena Nunnery, MD;  Location: [facility];  Service: Gastroenterology       Social History and Practical/Emotional/Spiritual Support:  Born in [place]   Raised with both parents   Parents died w/in 6 mos of each other. His parents knew they were going to pass and told him just before.  Half sister and 2 half brothers   ??   Married 30+ yrs. Wife [name]   2 other children son and daughter, adopted out   Daughter Bridgette   ??   Education - completed through 11th. Quit to take care of his parents   ??   Work   On Programmer, systems, EMT. Retired 6+ yrs ago.   Worked for 23 yrs.   Former Advertising account planner since age 25   Lives with wife, difficulty paying bills   They recently moved in to a rental home and very excited about having a shower and hot water        Family History:    family history includes Basal cell carcinoma in his mother; Blindness in his mother; Diabetes in his mother; Gallbladder disease in his half-brother; Heart disease in his father, half-sister, and mother; Hyperlipidemia in his father and mother; Kidney disease in his half-sister; Lung disease in his father; No Known Problems in his daughter and son; Obesity in his daughter, half-brother, and mother.  Patient unable to provide family history due to severity of illness: No.     Review of Systems:  A 12 system review of systems was negative except as noted in HPI.    Objective:   Seen in room. Mr. Travaglini is in bed, awakens easily to name. He appears fatigued but in NAD. Wife [name] at bedside.    Function:  50% - Ambulation: Mainly sit/lie / Unable to do any work, extensive disease / Self-Care:Considerable assistance required, Intake: Normal or reduced / Level of Conscious: Full or confusion    Temp:  [35.2 ??C (95.4 ??F)-36 ??C (96.8 ??F)] 35.8 ??C (96.4 ??F)  Heart Rate:  [75-86] 78  Resp:  [16-18] 18  BP: (122-134)/(60-72) 125/60  FiO2 (%):  [28 %] 28 %  SpO2:  [97 %-98 %] 97 %    I/O this shift:  In: 0   Out: 780 [Urine:780]    Physical Exam:  GEN: Fatigued, oriented x4, chronically ill, pale, fatigued  EYES: Sclera clear  CV: RRR  RESP: NWOB on room air, normal chest rise, diminished breath sounds bilaterally  GI: obese, soft, NT, +BS  SKIN: edema of abdomen, lower extremities    Test Results:  Lab Results   Component Value Date    WBC 10.3 07/26/2019    RBC 4.04 (L) 07/26/2019    HGB 10.3 (L) 07/26/2019    HCT 34.7 (L) 07/26/2019    MCV 85.8 07/26/2019    MCH 25.5 (L) 07/26/2019    MCHC 29.7 (L) 07/26/2019    RDW 15.8 (H) 07/26/2019    PLT 338 07/26/2019    MPV 8.7 07/26/2019     Lab Results   Component Value Date    NA 138 07/26/2019    K 5.5 (H) 07/26/2019    CL 104 07/26/2019    CO2 20.0 (L) 07/26/2019    BUN 57 (H) 07/26/2019    CREATININE 6.55 (H) 07/26/2019    GFR >= 60 01/09/2012    GLU 162 07/26/2019    CALCIUM 8.2 (L) 07/26/2019    ALBUMIN 3.3 (L) 07/25/2019    PHOS 7.2 (H) 07/26/2019      Lab Results   Component Value Date    ALKPHOS 89 04/01/2019    BILITOT 0.3 04/01/2019    BILIDIR 0.30 01/20/2019    PROT 5.7 (L) 04/01/2019    ALBUMIN 3.3 (L) 07/25/2019    ALT  8 07/25/2019    AST 12 (L) 04/01/2019       Imaging: reviewed in Epic    Total time spent with patient for evaluation & management (excluding ACP documented separately): 45 Minutes     Greater than 50% of this time spent on counseling/coordination of care:  Yes.   See ACP Note from today for additional billable service:  No.     Anell Barr, DNP, ANP-BC  Pager [phone]

## 2019-07-26 NOTE — Unmapped (Signed)
Care Management  Initial Transition Planning Assessment              General  Care Manager assessed the patient by : In person interview with patient, In person interview with family, Medical record review, Discussion with Clinical Care team  Orientation Level: Oriented X4  Functional level prior to admission: Independent  Reason for referral: Other - specify in comments  Additional Information: new start dialysis     CM met w/ pt and his wife to complete assessment - they are known to CM from a previous admission.  Pt is admitted at this time to initiate dialysis.  They would like to go to the clinic in [facility] affiliated w/ [facility] physicians [facility] [place]) - CM faxed referral to initiate.  Tunneled line and quant gold still pending.  Pt would also like HH at discharge. CM reached out to [employer]. With [facility] and pt is a candidate for [facility] at discharge.     Contact/Decision Maker  Extended Emergency Contact Information  Primary Emergency Contact: Ebert,[name]  Address: [address]           [place], [place] [address] [place] [employer] Phone: [phone]  Relation: Spouse  Secondary Emergency Contact: Dorado,Bridgette   [place]  Home Phone: [phone]  Mobile Phone: [phone]  Relation: Daughter    Legal Next of Kin / Guardian / POA / Advance Directives     [name] [facility]): Koudelka,[name] - [phone]    [name], First Alternate: Hogeland,Bridgette - Daughter - [phone]    Advance Directive (Medical Treatment)  Does patient have an advance directive covering medical treatment?: Patient has advance directive covering medical treatment, copy in chart.    Health Care Decision Maker [[name]] (Medical & Mental Health Treatment)  Healthcare Decision Maker: [name] documented in the [name]/Contact Info section.  Information offered on [name], Medical & Mental Health advance directives:: Other (Comment)    Patient Information  Lives with: Spouse/significant other    Type of Residence: Private residence   [address]  [place] [address]  [address]  [place], [place] [address]  Location/Detail: Pt and wife were homeless for much of the past year (lived in their car for 6 months).  They are newly housed and state it is very affordable and they are both very grateful    Support Systems/Concerns: Children, Family Members, Spouse    Responsibilities/Dependents at home?: No    Home Care services in place prior to admission?: No    Outpatient/Community Resources in place prior to admission: Outpatient Therapy (specify)  Agency detail (Name/Phone #): Pt has been followed by [facility] will palliative care in the outpatient clinic.  Also followed by cardiology, nephrology and Internal medicine    Equipment Currently Used at Home: cane, straight, walker, rolling, respiratory supplies  Current HME Agency (Name/Phone #): [facility] through [facility]    Currently receiving outpatient dialysis?: Other (Comment) (Pt will be a new start dialysis patient)       Financial Information    Need for financial assistance?: No    Social Determinants of Health  Social Determinants of Health were addressed in provider documentation.  Please refer to patient history.     Finances and SDOH were a significant issue in the past year however pt and his wife state that things are now stable.  They are in new housing which is very affordable and in which they feel comfortable. They both drive and  have transportation - no specific concerns were expressed.  Some food insecurity w/ new diet recommendations. Will place voucher for food bags on discharge.      Food Insecurity: Food Insecurity Present   ??? Worried About Programme researcher, broadcasting/film/video in the Last Year: Sometimes true   ??? Ran Out of Food in the Last Year: Sometimes true          Financial Resource Strain: Low Risk    ??? Difficulty of Paying Living Expenses: Not very hard          Transportation Needs: No Transportation Needs   ??? Lack of Transportation (Medical): No   ??? Lack of Transportation (Non-Medical): No       Housing/Utilities     Questions Responses    Within the past 12 months, have you ever stayed: outside, in a car, in a tent, in an overnight shelter, or temporarily in someone else's home (i.e. couch-surfing)? Yes    Are you worried about losing your housing? Yes    Within the past 12 months, have you been unable to get utilities (heat, electricity) when it was really needed? Yes          Discharge Needs Assessment  Concerns to be Addressed: discharge planning, adjustment to diagnosis/illness    Clinical Risk Factors: Principal Diagnosis: Cancer, Stroke, COPD, Heart Failure, AMI, Pneumonia, Joint Replacment, Multiple Diagnoses (Chronic), Dialysis    Barriers to taking medications: No    Prior overnight hospital stay or ED visit in last 90 days: No    Readmission Within the Last 30 Days: no previous admission in last 30 days         Anticipated Changes Related to Illness: none    Equipment Needed After Discharge: none    Discharge Facility/Level of Care Needs: other (see comments) (home w/ HH (pt [facility] for [facility] and he and his wife are in agreement)    Readmission  Risk of Unplanned Readmission Score: UNPLANNED READMISSION SCORE: 33%  Predictive Model Details          33% (High)  Factor Value    Calculated 07/26/2019 12:03 31% Number of active Rx orders 64    [place] Risk of Unplanned Readmission Model 10% Number of hospitalizations in last year 3     7% ECG/EKG order present in last 6 months     7% Latest calcium low (8.2 mg/dL)     6% Latest BUN high (57 mg/dL)     5% Diagnosis of electrolyte disorder present     5% Imaging order present in last 6 months     4% Latest hemoglobin low (10.3 g/dL)     4% Phosphorous result present     4% Number of ED visits in last six months 1     3% Charlson Comorbidity Index 4     3% Active anticoagulant Rx order present     3% Latest creatinine high (6.55 mg/dL)     3% Age 49     3% Diagnosis of renal failure present     1% Future appointment scheduled 1% Active ulcer medication Rx order present     0% Current length of stay 0.091 days      Readmitted Within the Last 30 Days? (No if blank)   Patient at risk for readmission?: Yes    Discharge Plan  Screen findings are: Discharge planning needs identified or anticipated (Comment). (dialysis and [facility])    Expected Discharge Date: 07/30/2019  Patient and/or family were provided with choice of facilities / services that are available and appropriate to meet post hospital care needs?: Yes   List choices in order highest to lowest preferred, if applicable. : Pt states he wants to go to [place] to stay w/ [facility] physicians; also agreeable to [facility] services through [facility]    Initial Assessment complete?: Yes

## 2019-07-26 NOTE — Unmapped (Signed)
VASCULAR INTERVENTIONAL RADIOLOGY INPATIENT CVC CONSULTATION     Requesting Attending Physician: Sharlett Iles, MD  Service Requesting Consult: Nephrology (MDB)    Date of Service: 07/26/2019  Consulting Interventional Radiologist: Dr. Claretta Fraise     HPI:     Reason for consult: Tunneled Hemodialysis catheter      History of Present Illness:   Jeremy Oliver is a 49 y.o. male with CKD, DM, HTN, Afib, pacemaker, OSA,heart failure with worsening renal failure needing initiation of hemodialysis.  Pt seen in consultation at the request of primary care team for consideration for tunneled hemodialysis catheter placement.  Creatinine 6.5.    Review of Systems:  Pertinent items are noted in HPI.    Medical History:     Past Medical History:  Past Medical History:   Diagnosis Date   ??? Angina at rest (CMS-[name])    ??? Arthritis    ??? Asthma    ??? Atrial fibrillation and flutter (CMS-[name]) 06/08/14   ??? BMI 40.0-44.9, adult (CMS-[name]) 10/29/2015   ??? Chronic anticoagulation 02/17/2018   ??? Chronic kidney disease, stage 3    ??? Chronic pain syndrome 04/11/2014    ~Use daily lyrica as recommended ~Tramadol as needed for pain ~Fluoxetine daily as recommended ~Daily exercise ~Eat a healthy diet ~Good control of your blood sugars can help    ??? Coronary artery disease    ??? Depression    ??? Diabetes mellitus (CMS-[name])    ??? Eye trauma 1998    glass in both eyes and removed   ??? GERD (gastroesophageal reflux disease)    ??? Gout    ??? Homeless    ??? Hyperlipidemia    ??? Hypertension    ??? Illiteracy and low-level literacy    ??? Microscopic hematuria    ??? Migraine    ??? Nephrolithiasis    ??? 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 Nonalcoholic fatty liver disease    ??? NSTEMI (non-ST elevated myocardial infarction) (CMS-[name]) 01/13/2017   ??? Obesity    ??? Obstructive sleep apnea 2009   ??? Panic attacks    ??? 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 Seizure-like activity (CMS-[name])     ~Monitor for symptoms and report them to your primary care provider if they occur    ??? Systolic CHF, chronic (CMS-[name])     EF 40-45% 2017   ??? TIA (transient ischemic attack)        Surgical History:  Past Surgical History:   Procedure Laterality Date   ??? CARDIAC CATHETERIZATION  03/08/2007   ??? CORONARY STENT PLACEMENT     ??? CYSTOURETHROSCOPY  12/31/2009     Microscopic hematuria   ??? KNEE SURGERY Right 09/23/2006    arthroscopic knee surgery medial and lateral meniscus tears as well as crystal disease   ??? PR CATH PLACE/CORON ANGIO, IMG SUPER/INTERP,R&L HRT CATH, L HRT VENTRIC N/A 02/13/2017    Procedure: Left/Right Heart Catheterization;  Surgeon: Marlaine Hind, MD;  Location: [facility] CATH;  Service: Cardiology   ??? PR CATH PLACE/CORON ANGIO, IMG SUPER/INTERP,W LEFT HEART VENTRICULOGRAPHY  N/A 03/17/2017    Procedure: Left Heart Catheterization W Intervention;  Surgeon: Marlaine Hind, MD;  Location: [facility] CATH;  Service: Cardiology   ??? PR CATH PLACE/CORON ANGIO, IMG SUPER/INTERP,W LEFT HEART VENTRICULOGRAPHY N/A 05/12/2018    Procedure: CATH LEFT HEART CATHETERIZATION W INTERVENTION;  Surgeon: Dorathy Kinsman, MD;  Location: [facility] CATH;  Service: Cardiology   ??? PR CATH PLACE/CORON ANGIO, IMG SUPER/INTERP,W LEFT HEART VENTRICULOGRAPHY N/A 10/22/2018    Procedure: CATH LEFT HEART CATHETERIZATION W INTERVENTION;  Surgeon: Marlaine Hind, MD;  Location: [facility] CATH;  Service: Cardiology   ??? PR ECMO/ECLS INITIATION VENO-ARTERIAL N/A 03/19/2017    Procedure: ECMO/ECLS; INITIATION, VENO-ARTERIAL;  Surgeon: Lennie Odor, MD;  Location: MAIN OR [facility]; Service: Cardiac Surgery   ??? PR ECMO/ECLS RMVL PRPH CANNULA OPEN 6 YRS & OLDER Left 03/25/2017    Procedure: ECMO / ECLS PROVIDED BY PHYSICIAN; REMOVAL OF PERIPHERAL (ARTERIAL AND/OR VENOUS) CANNULA(E), OPEN, 6 YEARS AND OLDER;  Surgeon: Alonna Buckler Ikonomidis, MD;  Location: MAIN OR [facility];  Service: Cardiac Surgery   ??? PR EPHYS EVAL W/ ABLATION SUPRAVENT ARRHYTHMIA N/A 07/19/2015    Catheter ablation of cavotricuspid isthmus for atrial flutter [facility])   ??? PR INSER HART PACER XVENOUS ATRIAL N/A 05/30/2015    [employer] dual-chamber pacemaker implant (E.Chung)   ??? PR INSERT/PLACE FLOW DIRECT CATH N/A 03/17/2017    Procedure: Insert Leave In [place];  Surgeon: Marlaine Hind, MD;  Location: [facility] CATH;  Service: Cardiology   ??? PR INSJ/RPLCMT PERM DFB W/TRNSVNS LDS 1/DUAL CHMBR N/A 05/04/2017    Procedure: ICD Implant System (Single/Dual);  Surgeon: Eldred Manges, MD;  Location: [facility] CATH;  Service: Cardiology   ??? PR NEGATIVE PRESSURE WOUND THERAPY DME >50 SQ CM Left 03/25/2017    Procedure: Neg Press Wound Tx (Vac Assist) Incl Topicals, Per Session, Tsa Greater Than/= 50 Cm Squared;  Surgeon: Alonna Buckler Ikonomidis, MD;  Location: MAIN OR [facility];  Service: Cardiac Surgery   ??? PR PRQ TRLUML CORONARY STENT W/ANGIO ONE ART/BRNCH N/A 03/12/2018    Procedure: Percutaneous Coronary Intervention;  Surgeon: Marlaine Hind, MD;  Location: [facility] CATH;  Service: Cardiology   ??? PR REBL VES DIRECT,LOW EXTREM Left 03/19/2017    Procedure: Repr Bld Vessel Direct; Lower Extrem;  Surgeon: Boykin Reaper, MD;  Location: MAIN OR [facility];  Service: Vascular   ??? PR REMV ART CLOT ILIAC-POP,LEG INCIS Left 03/25/2017    Procedure: EMBOLECTOMY OR THROMBECTOMY, WITH OR WITHOUT CATHETER; FEMOROPOPLITEAL, AORTOILIAC ARTERY, BY LEG INCISION;  Surgeon: Alonna Buckler Ikonomidis, MD;  Location: MAIN OR [facility];  Service: Cardiac Surgery   ??? PR UPPER GI ENDOSCOPY,BIOPSY N/A 02/28/2016    Procedure: UGI ENDOSCOPY; WITH BIOPSY, SINGLE OR MULTIPLE;  Surgeon: Liane Comber, MD;  Location: [facility];  Service: Gastroenterology   ??? PR UPPER GI ENDOSCOPY,DIAGNOSIS N/A 05/07/2016    Procedure: UGI ENDO, INCLUDE ESOPHAGUS, STOMACH, & DUODENUM &/OR JEJUNUM; DX W/WO COLLECTION SPECIMN, BY BRUSH OR WASH;  Surgeon: Modena Nunnery, MD;  [facility]: [facility];  Service: Gastroenterology       Family History:  Family History   Problem Relation Age of Onset   ??? Diabetes Mother    ??? Hyperlipidemia Mother    ??? Heart disease Mother    ??? Basal cell carcinoma Mother    ??? Blindness Mother         diabeties   ??? Obesity Mother    ??? Hyperlipidemia Father    ???  Heart disease Father    ??? Lung disease Father    ??? Heart disease Half-Sister    ??? Kidney disease Half-Sister    ??? Gallbladder disease Half-Brother    ??? Obesity Half-Brother    ??? No Known Problems Daughter    ??? No Known Problems Son    ??? Obesity Daughter    ??? Melanoma Neg Hx    ??? Squamous cell carcinoma Neg Hx    ??? Macular degeneration Neg Hx    ??? Glaucoma Neg Hx        Medications:   Current Facility-Administered Medications   Medication Dose Route Frequency Provider Last Rate Last Admin   ??? acetaminophen (TYLENOL) tablet 500 mg  500 mg Oral Q6H PRN Farrel Gordon, MD       ??? albumin human 25 % bottle 25 g  25 g Intravenous Each time in dialysis PRN Sharlett Iles, MD       ??? allopurinoL (ZYLOPRIM) tablet 100 mg  100 mg Oral BID Farrel Gordon, MD   100 mg at 07/25/19 2038   ??? amiodarone (PACERONE) tablet 200 mg  200 mg Oral Nightly Farrel Gordon, MD   200 mg at 07/25/19 2037   ??? amLODIPine (NORVASC) tablet 10 mg  10 mg Oral Nightly Farrel Gordon, MD   10 mg at 07/25/19 2039   ??? atorvastatin (LIPITOR) tablet 80 mg  80 mg Oral Daily Farrel Gordon, MD   80 mg at 07/25/19 1824   ??? bumetanide (BUMEX) tablet 5 mg  5 mg Oral BID Farrel Gordon, MD   5 mg at 07/25/19 2038   ??? carvediloL (COREG) tablet 37.5 mg  37.5 mg Oral BID Farrel Gordon, MD   37.5 mg at 07/25/19 2038   ??? cholecalciferol (vitamin D3 25 mcg (1,000 units)) tablet 25 mcg  25 mcg Oral Daily Farrel Gordon, MD   25 mcg at 07/25/19 1824   ??? dextrose 50 % in water (D50W) 50 % solution 12.5 g  12.5 g Intravenous Q10 Min PRN Sharlett Iles, MD       ??? diclofenac sodium (VOLTAREN) 1 % gel 2 g  2 g Topical TID PRN Farrel Gordon, MD       ??? epoetin alfa-EPBx (RETACRIT) injection 4,000 Units  4,000 Units Intravenous Each time in dialysis Sharlett Iles, MD       ??? ezetimibe (ZETIA) tablet 10 mg  10 mg Oral Nightly Farrel Gordon, MD   10 mg at 07/25/19 2040   ??? FLUoxetine (PROzac) capsule 80 mg  80 mg Oral Nightly Farrel Gordon, MD   80 mg at 07/25/19 2037   ??? gabapentin (NEURONTIN) capsule 300 mg  300 mg Oral BID Farrel Gordon, MD   300 mg at 07/25/19 2038   ??? gentamicin-sodium citrate lock solution in NS  2.1 mL hemodialysis port injection Each time in dialysis PRN Sharlett Iles, MD       ??? gentamicin-sodium citrate lock solution in NS  2.3 mL hemodialysis port injection Each time in dialysis PRN Sharlett Iles, MD       ??? hydrOXYzine (ATARAX) tablet 10 mg  10 mg Oral Nightly PRN Farrel Gordon, MD   10 mg at 07/26/19 0039   ??? insulin glargine (LANTUS) injection 35 Units  35 Units Subcutaneous Nightly Sharlett Iles, MD   35 Units at 07/25/19 2049   ??? insulin lispro (HumaLOG) injection  0-12 Units  0-12 Units Subcutaneous ACHS Sharlett Iles, MD       ??? insulin lispro (HumaLOG) injection 10 Units  10 Units Subcutaneous TID [facility] Sharlett Iles, MD   10 Units at 07/25/19 2049   ??? isosorbide mononitrate (IMDUR) 24 hr tablet 60 mg  60 mg Oral BID Farrel Gordon, MD   60 mg at 07/25/19 2037   ??? magnesium oxide (MAG-OX) tablet 400 mg  400 mg Oral Daily Mart Piggs, MD   400 mg at 07/25/19 2040   ??? ondansetron (ZOFRAN) tablet 4 mg  4 mg Oral Q8H PRN Farrel Gordon, MD       ??? pantoprazole (PROTONIX) EC tablet 40 mg  40 mg Oral Daily Farrel Gordon, MD   40 mg at 07/25/19 1824   ??? sevelamer (RENVELA) tablet 1,600 mg  1,600 mg Oral 3xd Meals Farrel Gordon, MD   1,600 mg at 07/25/19 2036   ??? sodium chloride 0.9% (NS) bolus 200 mL  200 mL Intravenous Each time in dialysis PRN Sharlett Iles, MD       ??? sodium zirconium cyclosilicate (LOKELMA) packet 10 g  10 g Oral Once Veneta Penton, MD       ??? ticagrelor [facility]) tablet 90 mg  90 mg Oral BID Farrel Gordon, MD   90 mg at 07/25/19 2040       Allergies:  Losartan and Opioids - morphine analogues    Social History:  Social History     Tobacco Use   ??? Smoking status: Never Smoker   ??? Smokeless tobacco: Current User     Types: Chew   ??? Tobacco comment: Pt in process of reducing tobacco use,  dips 1 can every 2 weeks,   Substance Use Topics   ??? Alcohol use: Never     Alcohol/week: 0.0 standard drinks     Comment: rare use: couple times a year, small amount   ??? Drug use: No       Objective:      Vital Signs:  Temp:  [35.2 ??C (95.4 ??F)-36 ??C (96.8 ??F)] 35.2 ??C (95.4 ??F)  Heart Rate:  [75-86] 75  Resp:  [16] 16  BP: (126-147)/(60-77) 134/70  MAP (mmHg):  [96] 96  FiO2 (%):  [28 %] 28 %  SpO2:  [97 %-98 %] 98 %  BMI (Calculated):  [46.29] 46.29    Physical Exam:      Vitals:    07/26/19 0545   BP: 134/70   Pulse: 75   Resp: 16   Temp: 35.2 ??C (95.4 ??F)   SpO2: 98%     ASA Grade: ASA 3 - Patient with moderate systemic disease with functional limitations  General: No apparent distress.  Lungs: Breathing comfortably on RA.  Heart:  Regular rate and rhythm.   Neuro: No obvious focal deficits.    Airway assessment: Class 3 - Can visualize soft palate    Diagnostic Studies:  None Relavent    Labs:    Recent Labs     07/25/19  1732 07/26/19  0527   WBC 11.5* 10.3   HGB 9.9* 10.3*   HCT 32.3* 34.7*   PLT 334 338     Recent Labs     07/25/19  0937 07/25/19  1732 07/25/19  1817 07/26/19  0527   NA 134* 137  --  138   K 5.3* 5.6* 5.3* 5.5*   CL 103 103  --  104   BUN 56* 60*  --  57*   CREATININE 5.8* 6.25*  --  6.55*   GLU 212* 147  --  162     Recent Labs     07/25/19 0931 07/25/19  1732   ALBUMIN 3.7 3.3*   ALT  --  8     Recent Labs     07/26/19  0527   INR 1.29       Blood Cultures Pending:  No.  Does Anticoagulation need to be held:  No.    Assessment and Recommendations:     Jeremy Oliver is a 49 y.o. male with multiple medical conditions per HPI including CKD with worsening renal function needing tunneled hemodialysis catheter for initiation of hemodialysis.    Recommendations:  - Proceed with placement of Dialysis - Tunneled  - Anticipated procedure date: Tomorrow Pending VIR schedule  - Please make NPO night prior to procedure  - Please ensure recent CBC, Creatinine, and INR are available    Informed Consent:  This procedure has been fully reviewed with the patient/patient???s authorized representative. The risks, benefits and alternatives have been explained, and the patient/patient???s authorized representative has consented to the procedure.  --The patient will accept blood products in an emergent situation.  --The patient does not have a Do Not Resuscitate order in effect.      Thank you for involving [place] in the care of this patient. Please page the VIR consult pager [phone]) with further questions, concerns, or if new issues arise.

## 2019-07-26 NOTE — Unmapped (Signed)
Pt admit to floor, alert and orient x4, able to call for assistance prn, pt denies pain, NPO after MN for procedure, pt able to wear bipap at HS, pt did c/o anxious and SOB at HS, sats 94% and up on RA, prn anxiety medication given per order, encourage pt keep bipap on, pt able to sleep afterwards, will continue to monitor    Problem: Adult Inpatient Plan of Care  Goal: Plan of Care Review  Outcome: Progressing  Goal: Patient-Specific Goal (Individualization)  Outcome: Progressing  Goal: Absence of Hospital-Acquired Illness or Injury  Outcome: Progressing  Goal: Optimal Comfort and Wellbeing  Outcome: Progressing  Goal: Readiness for Transition of Care  Outcome: Progressing  Goal: Rounds/Family Conference  Outcome: Progressing     Problem: Adjustment to Illness (Chronic Kidney Disease)  Goal: Optimal Coping with Chronic Illness  Outcome: Progressing     Problem: Electrolyte Imbalance (Chronic Kidney Disease)  Goal: Electrolyte Balance  Outcome: Progressing     Problem: Functional Decline (Chronic Kidney Disease)  Goal: Optimal Functional Ability  Outcome: Progressing     Problem: Oral Intake Inadequate (Chronic Kidney Disease)  Goal: Optimal Oral Intake  Outcome: Progressing     Problem: Anxiety  Goal: Anxiety Reduction or Resolution  Outcome: Progressing

## 2019-07-26 NOTE — Unmapped (Addendum)
PHYSICAL THERAPY  Evaluation (Co-evaluation with Tess Botsford, OT given pt's decreased activity tolerance)   (07/26/19 0808)     Patient Name:  Jeremy Oliver       Medical Record Number: [id number]   Date of Birth: January 17, 1971  Sex: Male            Treatment Diagnosis: Acute back pain and reports of dizziness limiting functional mobility    ASSESSMENT  Problem List: Obesity, Decreased endurance, Decreased mobility, Impaired ADLs, Fall Risk, Impaired sensation, Pain, Decreased range of motion, Dizziness/vertigo     Assessment : Jeremy Oliver is a 49 y.o. male with PMHx of CKD, CAD, AICD, DM, HTN, HLD, HFrEF (EF 35% w G1DD), TIA, A fib on xarelto that presented as a direct admission for new start dialysis in setting of refractory volume overload. Pt presents to PT evaluation as independent with bed mobility and with OOB transfers. Gait assessment was limited by acute onset of low back pain and reports of his back going out, prompting quick return to room. Pt experienced an episode of dizziness and lightheaded-ness upon return to room, BP 122/72 (which pt reports is low for him). Anticipate that pt is close to functional baseline but pt is appropriate for acute skilled PT services to maximize activity tolerance and minimize fall risk prior to discharging home. Currently recommend post-acute PT services at frequency of 3x/week. No DME needs identified as pt owns a rolling walker and cane.    After a review of the personal factors, comorbidities, clinical presentation, and examination of the number of affected body systems, the patient presents as a moderate complexity case.     Today's Interventions: AMPAC 6 clicks: 21/24, PT evaluation, bed mobility from flat surface, sit<>stand transfers from EOB, short distance ambulation trial in hallway, vital signs and symptoms monitored throughout. PT Education: PT role and POC, PT vs. OT disciplines, risks of prolonged bedrest, importance of supervised OOB mobility to minimize risks of prolonged bedrest and decreased back pain.    PLAN  Planned Frequency of Treatment:  1-2x per day for: 2-3x week      Planned Interventions: Education - Patient, Education - Family / caregiver, Endurance activities, Functional mobility, Investment banker, operational, Self-care / Home training, Stair training, Home exercise program, Therapeutic exercise, Therapeutic activity, Transfer training    Post-Discharge Physical Therapy Recommendations:  3x weekly    PT DME Recommendations: None (Pt owns a rolling walker and cane.)           Goals:   Patient and Family Goals: No goals stated.    Long Term Goal #1: Pt will ambulate 577ft with LRAD, if needed, modified independent for return to short distance community ambulation within 3 weeks.     SHORT GOAL #1: Pt will perform OOB transfers with LRAD, if needed, modified independent.              Time Frame : 1 week  SHORT GOAL #2: Pt will ambulate 163ft with LRAD, if needed, modified independent with back pain <4/10 throughout.              Time Frame : 1 week  SHORT GOAL #3: Pt will ascend/descend 1 step without UE support on handrails with supervision to simulate entrance/exit into home.              Time Frame : 1 week     Prognosis:  Guarded  Positive Indicators: Reported PLOF, caregiver support  Barriers to Discharge: Pain, Endurance deficits, Other (  Dizziness)    SUBJECTIVE  Patient reports: Pt agrees to PT evaluation and states, My back is starting to go out. Get me back to the room.  Current Functional Status: Pt received awake and alert in left sidelying position in bed with wife, [name], at bedside. RN, Huili, cleared pt for mobilization with PT/OT and was notified of outcomes. Pt left supine with HOB elevated with needs and call bell within reach.     Prior Functional Status: Prior to admission, pt was an independent short distance community ambulator without device. Pt uses a motorized scooter with long distances such as at the grocery store or [place]. Pt is independent with ADLs, IADLs, and drives. Pt enjoys cooking for himself and his wife. Pt endorses 1 significant and recent fall (~1.5 months ago when he was walking his dog; the dog lunged forward throwing the patient off balance). Pt endorses a 2 month history of SOB with ambulation of greater than 68ft. Since admission, pt has been getting out of bed and walking to/from the bathroom independently, without back pain or episodes of dizziness.  Equipment available at home: Levan Hurst, Tiro     Past Medical History:   Diagnosis Date   ??? Angina at rest (CMS-[name])    ??? Arthritis    ??? Asthma    ??? Atrial fibrillation and flutter (CMS-[name]) 06/08/14   ??? BMI 40.0-44.9, adult (CMS-[name]) 10/29/2015   ??? Chronic anticoagulation 02/17/2018   ??? Chronic kidney disease, stage 3    ??? Chronic pain syndrome 04/11/2014    ~Use daily lyrica as recommended ~Tramadol as needed for pain ~Fluoxetine daily as recommended ~Daily exercise ~Eat a healthy diet ~Good control of your blood sugars can help    ??? Coronary artery disease    ??? Depression    ??? Diabetes mellitus (CMS-[name])    ??? Eye trauma 1998    glass in both eyes and removed   ??? GERD (gastroesophageal reflux disease)    ??? Gout    ??? Homeless    ??? Hyperlipidemia    ??? Hypertension    ??? Illiteracy and low-level literacy    ??? Microscopic hematuria    ??? Migraine    ??? Nephrolithiasis    ??? 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 Nonalcoholic fatty liver disease    ??? NSTEMI (non-ST elevated myocardial infarction) (CMS-[name]) 01/13/2017   ??? Obesity    ??? Obstructive sleep apnea 2009   ??? Panic attacks    ??? 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 Seizure-like activity (CMS-[name])     ~Monitor for symptoms and report them to your primary care provider if they occur    ??? Systolic CHF, chronic (CMS-[name])     EF 40-45% 2017   ??? TIA (transient ischemic attack)     Social History     Tobacco Use   ??? Smoking status: Never Smoker   ??? Smokeless tobacco: Current User     Types: Chew   ??? Tobacco comment: Pt in process of reducing tobacco use,  dips 1 can every 2 weeks,  Substance Use Topics   ??? Alcohol use: Never     Alcohol/week: 0.0 standard drinks     Comment: rare use: couple times a year, small amount      Past Surgical History:   Procedure Laterality Date   ??? CARDIAC CATHETERIZATION  03/08/2007   ??? CORONARY STENT PLACEMENT     ??? CYSTOURETHROSCOPY  12/31/2009     Microscopic hematuria   ??? KNEE SURGERY Right 09/23/2006    arthroscopic knee surgery medial and lateral meniscus tears as well as crystal disease   ??? PR CATH PLACE/CORON ANGIO, IMG SUPER/INTERP,R&L HRT CATH, L HRT VENTRIC N/A 02/13/2017    Procedure: Left/Right Heart Catheterization;  Surgeon: Marlaine Hind, MD;  Location[facility];  Service: Cardiology   ??? PR CATH PLACE/CORON ANGIO, IMG SUPER/INTERP,W LEFT HEART VENTRICULOGRAPHY N/A 03/17/2017    Procedure: Left Heart Catheterization W Intervention;  Surgeon: Marlaine Hind, MD;  Location: [facility] CATH;  Service: Cardiology   ??? PR CATH PLACE/CORON ANGIO, IMG SUPER/INTERP,W LEFT HEART VENTRICULOGRAPHY N/A 05/12/2018    Procedure: CATH LEFT HEART CATHETERIZATION W INTERVENTION;  Surgeon: Dorathy Kinsman, MD;  Location: [facility] CATH;  Service: Cardiology   ??? PR CATH PLACE/CORON ANGIO, IMG SUPER/INTERP,W LEFT HEART VENTRICULOGRAPHY N/A 10/22/2018    Procedure: CATH LEFT HEART CATHETERIZATION W INTERVENTION;  Surgeon: Marlaine Hind, MD;  Location: [facility] CATH;  Service: Cardiology   ??? PR ECMO/ECLS INITIATION VENO-ARTERIAL N/A 03/19/2017    Procedure: ECMO/ECLS; INITIATION, VENO-ARTERIAL;  Surgeon: Lennie Odor, MD;  Location: MAIN OR [facility];  Service: Cardiac Surgery   ??? PR ECMO/ECLS RMVL PRPH CANNULA OPEN 6 YRS & OLDER Left 03/25/2017    Procedure: ECMO / ECLS PROVIDED BY PHYSICIAN; REMOVAL OF PERIPHERAL (ARTERIAL AND/OR VENOUS) CANNULA(E), OPEN, 6 YEARS AND OLDER;  Surgeon: Alonna Buckler Ikonomidis, MD;  Location: MAIN OR [facility];  Service: Cardiac Surgery   ??? PR EPHYS EVAL W/ ABLATION SUPRAVENT ARRHYTHMIA N/A 07/19/2015    Catheter ablation of cavotricuspid isthmus for atrial flutter [facility])   ??? PR INSER HART PACER XVENOUS ATRIAL N/A 05/30/2015    [employer] dual-chamber pacemaker implant (E.Chung)   ??? PR INSERT/PLACE FLOW DIRECT CATH N/A 03/17/2017    Procedure: Insert Leave In [place];  Surgeon: Marlaine Hind, MD;  Location: [facility] CATH;  Service: Cardiology   ??? PR INSJ/RPLCMT PERM DFB W/TRNSVNS LDS 1/DUAL CHMBR N/A 05/04/2017    Procedure: ICD Implant System (Single/Dual);  Surgeon: Eldred Manges, MD;  Location: [facility] CATH;  Service: Cardiology   ??? PR NEGATIVE PRESSURE WOUND THERAPY DME >50 SQ CM Left 03/25/2017    Procedure: Neg Press Wound Tx (Vac Assist) Incl Topicals, Per Session, Tsa Greater Than/= 50 Cm Squared;  Surgeon: Alonna Buckler Ikonomidis, MD;  Location: MAIN OR [facility];  Service: Cardiac Surgery   ??? PR PRQ TRLUML CORONARY STENT W/ANGIO ONE ART/BRNCH N/A 03/12/2018    Procedure: Percutaneous Coronary Intervention;  Surgeon: Marlaine Hind, MD;  Location: [facility] CATH;  Service: Cardiology   ??? PR REBL VES DIRECT,LOW EXTREM Left 03/19/2017    Procedure: Repr Bld Vessel Direct; Lower Extrem;  Surgeon: Boykin Reaper, MD;  Location: MAIN OR [facility];  Service: Vascular   ??? PR REMV ART CLOT ILIAC-POP,LEG INCIS Left 03/25/2017    Procedure: EMBOLECTOMY OR THROMBECTOMY, WITH OR WITHOUT CATHETER; FEMOROPOPLITEAL, AORTOILIAC ARTERY, BY LEG INCISION;  Surgeon: Alonna Buckler Ikonomidis, MD;  Location: MAIN OR [facility];  Service: Cardiac Surgery   ??? PR UPPER GI ENDOSCOPY,BIOPSY N/A 02/28/2016  Procedure: UGI ENDOSCOPY; WITH BIOPSY, SINGLE OR MULTIPLE;  Surgeon: Liane Comber, MD;  Location: [facility];  Service: Gastroenterology   ??? PR UPPER GI ENDOSCOPY,DIAGNOSIS N/A 05/07/2016    Procedure: UGI ENDO, INCLUDE ESOPHAGUS, STOMACH, & DUODENUM &/OR JEJUNUM; DX W/WO COLLECTION SPECIMN, BY BRUSH OR WASH;  Surgeon: Modena Nunnery, MD;  Location: [facility];  Service: Gastroenterology    Family History   Problem Relation Age of Onset   ??? Diabetes Mother    ??? Hyperlipidemia Mother    ??? Heart disease Mother    ??? Basal cell carcinoma Mother    ??? Blindness Mother         diabeties   ??? Obesity Mother    ??? Hyperlipidemia Father    ??? Heart disease Father    ??? Lung disease Father    ??? Heart disease Half-Sister    ??? Kidney disease Half-Sister    ??? Gallbladder disease Half-Brother    ??? Obesity Half-Brother    ??? No Known Problems Daughter    ??? No Known Problems Son    ??? Obesity Daughter    ??? Melanoma Neg Hx    ??? Squamous cell carcinoma Neg Hx    ??? Macular degeneration Neg Hx    ??? Glaucoma Neg Hx         Allergies: Losartan and Opioids - morphine analogues     Objective Findings  Precautions / Restrictions  Precautions: Falls precautions  Weight Bearing Status: Non-applicable  Required Braces or Orthoses: Non-applicable (Previously wore a L AFO for chronic foot drop, stopped wearing d/t skin breakdown)    Communication Preference: Verbal   Pain Comments: Denies pain at rest, 10/10 low back pain (describes as numbness and muscle cramping) with short distance ambulation (acute and insidious onset), radiates down into buttocks but no farther distal  Medical Tests / Procedures: Medical record, lab values, imaging, and vital signs reviewed.  Equipment / Environment: Vascular access (PIV, TLC, Port-a-cath, PICC), Patient not wearing mask for full session, Caregiver not wearing mask for full session (Patient wearing mask when ambulating in hallway; therapist wearing mask and eye protection throughout)    At Rest: SpO2 96% on RA, HR 84bpm  With Activity: SpO2 93-95% on RA, HR 88bpm, BP 122/72 (post-ambulation; pt reports dizziness and lightheaded-ness, and became disengaged with therapist briefly)--reports sBPs typically run in 150s     Airway Clearance: OOB activity    Living Situation  Living Environment: House  Lives With: Spouse  Home Living: One level home, Stairs to enter without rails, Standard height toilet, Walk-in shower  Number of Stairs: 1     Cognition: A&Ox4, cooperative and follows commands appropriately, verbose  Visual / Perception Status: Reports worsening farsighted-ness over the last 2-3 years, wears readers  Skin Inspection: Visible areas dry, clean, and intact    UE ROM: WFL  UE Strength: WFL  LE ROM: Decreased L ankle DF (history of chronic foot drop, following MI in 2018), otherwise AROM WFL  LE Strength: Grossly 5/5 throughout with exception of L ankle DF                Coordination: Gross motor control intact      Sensation: History of peripheral neuropathy; +numbness and tingling in LLE (from the knee down)  Balance: Intact static and dynamic sitting balance; intact static standing balance, CGA for dynamic standing balance via HHA       Bed Mobility: Supine (from flat surface) to  sitting EOB: independently; sit to supine (to flat surface) independently  Transfers: Sit<>stand from EOB: supervision (on 2nd attempt), unable to acheive full upright stand on 1st attempt d/t back pain, rocks forward anteriorly to gain momentum   Gait  Level of Assistance: Contact guard assist, steadying assist  Assistive Device: None  Distance Ambulated (ft): 40 ft  Gait: Pt ambulated 14ft in room and hallway with SBA initially, progressed to CGA-min-A via HHA. Compensatory L hip/knee flexion d/t history of L foot drop. Pt with reports of worsening back pain and cramping during gait assessment (10/10 LBP), and requesting quick return to room. Endurance: Fair activity tolerance    Physical Therapy Session Duration  PT Individual - Duration: 40    Medical Staff Made Aware: RN, Huili, updated    I attest that I have reviewed the above information.  Signed: Alphonsa Gin, PT  [facility] 07/26/2019

## 2019-07-26 NOTE — Unmapped (Signed)
Pt is A&Ox4, afebrile, VSS, HD cath tomorrow. PT worked with pt. Stand by assist with walk. Free of fall.

## 2019-07-26 NOTE — Unmapped (Signed)
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    Assessment/Plan:   Jeremy Oliver is a 49 y.o. male with PMHx of CKD, CAD, AICD, DM, HTN, HLD, HFrEF (EF 35% w G1DD), TIA, A fib on xarelto that presented as a direct admission for new start dialysis in setting of refractory volume overload.    HFrEF (EF 35% w G1DD) w volume overload refractory to diuretics requiring HD - CKD: Followed in diuresis clinic for IV diuretics twice weekly in addition to home Bumex 5mg  BID but has had persistent weight gain and with worsening renal function (Cr 5.8 on day of admission) requiring new start dialysis. Wt was 360lbs on arrival, which has downtrended from 388lbs ~3weeks ago. Pt remains comfortable WOB on RA though some dependent rales on exam.  - New start HD labs ordered  - Consult to VIR for tunneled line, thereafter start HD.   - HOLD xarelto for line, last dose 4/26  - Continue home bumex 5mg  BID  - Cont home carvedilol  - NPO at MN, hold AM subcutaneous heparin for DVT ppx, resume if not getting procedure or post-procedure  ??  OSA: BiPAP for sleep  ??  CAD - Paroxysmal Afib: S/p placement of 7 DES, most recently July 2020 to distal RCA. On ticagrelor. Some exertional CP at baseline, uses nitro PRN, last time ~2wks ago. No CP on arrival.  - EKG pending  - Continue ticagrelor in setting of recent stent placement in last year  - Hold xarelto  - Cont home amiodarone  ??  Hyperkalemia/Hyperphosphatemia: Potassium stable in the low fives and rechecks, despite refusing Lokelma. EKG without changes.   - Cont home sevelamer 1600mg  TID    Iron deficiency anemia:  Slightly anemic at 10.3. MCV 86. Iron sat 11%, ferritin 150.   - Iron with dialysis   ??  HTN: Cont home amlodipine, carvedilol, Imdur  T2DM: Home regimen lantus 45u at bedtime, humalog 22u TIDAC  - Lantus 35u at bedtime (22 units 4/27 2/2 NPO)  - Lispro 10u TIDAC w SSI  - Home gabapentin for peripheral neuropathy 300mg  BID  Anxiety: Cont home fluoxetine, atarax  HLD: Cont home atorvastatin, ezetimibe   GERD: Cont home protonix    Chronic Medical Problems:    F: NMIVF  E: replete PRN  N: Regular Diet and NPO at MN    GI PPx: Home H2 Blocker  DVT PPx: Restarting home eliquis after tunelled line  Access: PIV    Code Status: Full Code    Disposition: Nephrology (MDB) , Floor   ______________________________________________________________________      Objective:  Labs and Studies:  Labs and studies  reviewed in EMR from previous 24 hours    Vitals Range Last 24 Hours:  Temp:  [35.2 ??C-36 ??C] 35.8 ??C  Heart Rate:  [75-86] 78  Resp:  [16-18] 18  BP: (122-134)/(60-72) 125/60  MAP (mmHg):  [85-96] 85  FiO2 (%):  [28 %] 28 %  SpO2:  [97 %-98 %] 97 %  BMI (Calculated):  [46.29] 46.29    Ins/Outs Last 3 Shifts:  I/O last 3 completed shifts:  In: 480 [P.O.:480]  Out: 500 [Urine:500]    Physical Exam:  Gen: WDWN, obese in NAD, alert, oriented, answers questions appropriately  HEENT: atraumatic, normocephalic, sclera anicteric, MMM  Heart: RRR, S1, S2, no M/R/G, no chest wall tenderness  Lungs: CTAB, +crackles   Abdomen: Normoactive bowel sounds, soft, +TTP epigastric  Extremities: no clubbing, cyanosis, 2-3+ pitting edema BLEs, symmetric  Neuro: No focal deficits.  Skin:  No rashes, lesions on clothed exam    Psych: Normal mood and affect.   Elise Benne, MD  Internal Medicine PGY-1  [phone]

## 2019-07-26 NOTE — Unmapped (Signed)
ICD Interrogation  RE: routine    Manufacturer: Advertising copywriter: V[employer]ilant EL ICD [phone]  Type of Device: Oceanographer    Battery: 12.5 yearsestimated longevity    Mode: DDD  Lower rate limit 70  bpm  Tachy Settings:      Presenting Rhythm: AS/VS  Underlying rhythm: SR  Dependent:  No    Pacing Percentages  AP: 13  RVP: 1    Impedence (ohms)  Atrial lead 636  Right Ventricular lead 291   HV/SVC 46     Sensing (mV)  (P)Atrial lead 3.7  (R)Ventricular lead 14.1    Capture threshold (V @ ms)  Atrial lead 0.9 V @ 0.40 ms  Right Ventricular lead 0.9 V @ 0.40 ms       Diagnostics/Episodes  ?? (9) ATR  ?? (9) NSVT  ?? (20) VT-1; no therapies given.    Fluid Monitoring  ?? No Evidence of Fluid Accumulation     Reprogramming  ?? None    See PDF in media tab for full interrogation record    Questions, please call the Ep Device Rn via Vocera or dial ext [phone]  Ep Device clinic [phone]

## 2019-07-26 NOTE — Unmapped (Addendum)
Jeremy Oliver is a 49 y.o. male w/ a history of CKD, Type 2 DM, and HFrEF (35%) who is presenting at the request of his outpatient nephrologist for the initiation of hemodialysis. [name] states that he has been having shortness of breath, both with laying flat as well as with walking very short distances. The orthopnea has improved with the initiation of diuresis clinic 2 weeks ago. He states that the shortness of breath with exertion is onset with only walking about 15-66ft and is associated with substernal chest pain. He has taken multiple nitroglycerin for this chest pain, last was about 2 weeks ago. He also has some associated dizziness and lightheadedness during these episodes. Aside from the exertional chest pain/SOB he has also had multiple recent episodes of sharp abdominal pain. Initially the pain was in the RLQ and now in the epigastric region. His pain usually lasts about 30 minutes and nothing he has tried for pain has helped. He has some intermittent nausea associated with the pain but has not had to take any zofran that he is prescribed. He has had a good appetite and with diuresis clinic as well as his daily diuretics he has been urinating well. He has had two episodes of diarrhea recently but last BM this morning was normal. No black or bloody stools. He started diuresis clinic about 2-3 weeks ago and has been going twice weekly. He does say that he feels his swelling and shortness of breath is improved after initiation of diuresis clinic.

## 2019-07-27 LAB — QUANTIFERON ANTIGEN 1 MINUS NIL: Lab: 0

## 2019-07-27 LAB — CO2: Carbon dioxide:SCnc:Pt:Ser/Plas:Qn:: 18 — ABNORMAL LOW

## 2019-07-27 LAB — CBC
HEMATOCRIT: 31.3 % — ABNORMAL LOW (ref 41.0–53.0)
HEMOGLOBIN: 9.6 g/dL — ABNORMAL LOW (ref 13.5–17.5)
MEAN CORPUSCULAR HEMOGLOBIN CONC: 30.6 g/dL — ABNORMAL LOW (ref 31.0–37.0)
MEAN CORPUSCULAR HEMOGLOBIN: 26.1 pg (ref 26.0–34.0)
MEAN CORPUSCULAR VOLUME: 85.2 fL (ref 80.0–100.0)
MEAN PLATELET VOLUME: 8.6 fL (ref 7.0–10.0)
PLATELET COUNT: 339 10*9/L (ref 150–440)
RED BLOOD CELL COUNT: 3.67 10*12/L — ABNORMAL LOW (ref 4.50–5.90)
WBC ADJUSTED: 9.6 10*9/L (ref 4.5–11.0)

## 2019-07-27 LAB — TB AG1: TB AG1 VALUE: 0

## 2019-07-27 LAB — BASIC METABOLIC PANEL
ANION GAP: 13 mmol/L (ref 7–15)
BUN / CREAT RATIO: 9
CALCIUM: 8 mg/dL — ABNORMAL LOW (ref 8.5–10.2)
CHLORIDE: 105 mmol/L (ref 98–107)
CO2: 18 mmol/L — ABNORMAL LOW (ref 22.0–30.0)
CREATININE: 6.7 mg/dL — ABNORMAL HIGH (ref 0.70–1.30)
EGFR CKD-EPI AA MALE: 10 mL/min/{1.73_m2} — ABNORMAL LOW (ref >=60)
EGFR CKD-EPI NON-AA MALE: 9 mL/min/{1.73_m2} — ABNORMAL LOW (ref >=60)
POTASSIUM: 5.1 mmol/L — ABNORMAL HIGH (ref 3.5–5.0)
SODIUM: 136 mmol/L (ref 135–145)

## 2019-07-27 LAB — TB AG1 VALUE: Lab: 0

## 2019-07-27 LAB — QUANTIFERON TB GOLD PLUS
QUANTIFERON ANTIGEN 1 MINUS NIL: 0 [IU]/mL
QUANTIFERON TB NIL VALUE: 0 [IU]/mL

## 2019-07-27 LAB — TB AG2 VALUE: Lab: 0

## 2019-07-27 LAB — TB NIL VALUE: Lab: 0

## 2019-07-27 LAB — RED BLOOD CELL COUNT: Lab: 3.67 — ABNORMAL LOW

## 2019-07-27 LAB — MAGNESIUM: Magnesium:MCnc:Pt:Ser/Plas:Qn:: 1.8

## 2019-07-27 LAB — TB AG2: TB AG2 VALUE: 0

## 2019-07-27 LAB — TB MITOGEN VALUE: Lab: 4

## 2019-07-27 LAB — PHOSPHORUS: Phosphate:MCnc:Pt:Ser/Plas:Qn:: 7.6 — ABNORMAL HIGH

## 2019-07-27 MED ORDER — APIXABAN 5 MG TABLET
ORAL_TABLET | Freq: Two times a day (BID) | ORAL | 0 refills | 30.00000 days
Start: 2019-07-27 — End: 2019-07-27

## 2019-07-27 NOTE — Unmapped (Signed)
Patient updated re: plan of care.  Patient verbalized understanding.  Arrived back from [facility]IR with HD line in place without complication.  Voiced feeling nervous to start HD.  Supportive family member at bedside.  Vitals stable.  Will continue to monitor.    Problem: Adult Inpatient Plan of Care  Goal: Plan of Care Review  Outcome: Progressing  Goal: Patient-Specific Goal (Individualization)  Outcome: Progressing  Goal: Absence of Hospital-Acquired Illness or Injury  Outcome: Progressing  Goal: Optimal Comfort and Wellbeing  Outcome: Progressing  Goal: Readiness for Transition of Care  Outcome: Progressing  Goal: Rounds/Family Conference  Outcome: Progressing     Problem: Adjustment to Illness (Chronic Kidney Disease)  Goal: Optimal Coping with Chronic Illness  Outcome: Progressing     Problem: Electrolyte Imbalance (Chronic Kidney Disease)  Goal: Electrolyte Balance  Outcome: Progressing     Problem: Fluid Volume Excess (Chronic Kidney Disease)  Goal: Fluid Balance  Outcome: Progressing     Problem: Functional Decline (Chronic Kidney Disease)  Goal: Optimal Functional Ability  Outcome: Progressing     Problem: Hematologic Alteration (Chronic Kidney Disease)  Goal: Absence of Anemia Signs/Symptoms  Outcome: Progressing     Problem: Oral Intake Inadequate (Chronic Kidney Disease)  Goal: Optimal Oral Intake  Outcome: Progressing     Problem: Renal Function Impairment (Chronic Kidney Disease)  Goal: Laboratory Values and Blood Pressure Within Desired Range  Outcome: Progressing     Problem: Anxiety  Goal: Anxiety Reduction or Resolution  Outcome: Progressing     Problem: Fall Injury Risk  Goal: Absence of Fall and Fall-Related Injury  Outcome: Progressing     Problem: Device-Related Complication Risk (Hemodialysis)  Goal: Safe, Effective Therapy Delivery  Outcome: Progressing     Problem: Hemodynamic Instability (Hemodialysis)  Goal: Vital Signs Remain in Desired Range  Outcome: Progressing     Problem: Infection (Hemodialysis)  Goal: Absence of Infection Signs/Symptoms  Outcome: Progressing

## 2019-07-27 NOTE — Unmapped (Signed)
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)    GERD (gastroesophageal reflux disease)    Paroxysmal atrial fibrillation (CMS-[name])    OSA (obstructive sleep apnea)    Tobacco use disorder    Diabetes mellitus with gastroparesis (CMS-[name])    Hyperkalemia    CKD (chronic kidney disease) stage 4, GFR 15-29 ml/min (CMS-[name])    ESRD needing dialysis (CMS-[name])    Palliative care by specialist       LOS: 1 day     Interval History/Subjective:   -S/p VIR placement of line today; undergoing 1st session of HD today  -Restarting Xarelto for AF  -Potassium stable at 5.1  -Stopping Bumex given starting IHD, will reassess    Assessment/Plan:   Jeremy Oliver is a 49 y.o. male with PMHx of CKD, CAD, AICD, DM, HTN, HLD, HFrEF (EF 35% w G1DD), TIA, A fib on xarelto that presented as a direct admission for new start dialysis in setting of refractory volume overload.    HFrEF (EF 35% w G1DD) w volume overload refractory to diuretics requiring HD - CKD: Followed in diuresis clinic for IV diuretics twice weekly in addition to home Bumex 5mg  BID but has had persistent weight gain and with worsening renal function (Cr 5.8 on day of admission) requiring new start dialysis. Wt was 360lbs on arrival, which has downtrended from 388lbs ~3weeks ago. Pt remains comfortable WOB on RA though some dependent rales on exam.  - New start HD labs ordered  - Stop home bumex 5mg  BID  - Cont home carvedilol  ??  OSA: BiPAP for sleep  ??  CAD - Paroxysmal Afib: S/p placement of 7 DES, most recently July 2020 to distal RCA. On ticagrelor. Some exertional CP at baseline, uses nitro PRN, last time ~2wks ago. No CP on arrival.  - EKG pending  - Continue ticagrelor in setting of recent stent placement in last year  - restart xarelto  - Cont home amiodarone  ??  Hyperkalemia/Hyperphosphatemia: Potassium stable in the low fives and rechecks, despite refusing Lokelma. EKG without changes.   - Cont home sevelamer 1600mg  TID    Iron deficiency anemia:  Slightly anemic at 10.3. MCV 86. Iron sat 11%, ferritin 150.   - Iron with dialysis   ??  HTN: Cont home amlodipine, carvedilol, Imdur  T2DM: Home regimen lantus 45u at bedtime, humalog 22u TIDAC  - Lantus 35u at bedtime   - Lispro 10u TIDAC w SSI  - Home gabapentin for peripheral neuropathy 300mg  BID  Anxiety: Cont home fluoxetine, atarax  HLD: Cont home atorvastatin, ezetimibe   GERD: Cont home protonix    F: NMIVF  E: replete PRN  N: Regular Diet and NPO at MN    GI PPx: Home H2 Blocker  DVT PPx: Restarting home eliquis after tunelled line  Access: PIV    Code Status: Full Code    Disposition: Nephrology (MDB) , Floor   ______________________________________________________________________      Objective:  Labs and Studies:  Labs and studies reviewed in EMR from previous 24 hours    Vitals Range Last 24 Hours:  Temp:  [35.2 ??C-36.6 ??C] 36.6 ??C  Heart Rate:  [70-83] 70  SpO2 Pulse:  [70-81] 79  Resp:  [11-23] 18  BP: (  103-181)/(60-99) 103/71  MAP (mmHg):  [82-115] 93  SpO2:  [92 %-100 %] 96 %    Ins/Outs Last 3 Shifts:  I/O last 3 completed shifts:  In: 1200 [P.O.:1200]  Out: 4450 [Urine:4450]    Physical Exam:  Gen: WDWN, obese in NAD, alert, oriented, answers questions appropriately  HEENT: atraumatic, normocephalic, sclera anicteric, MMM  Heart: RRR, S1, S2, no M/R/G, no chest wall tenderness  Lungs: CTAB, +crackles   Abdomen: Normoactive bowel sounds, soft,  NT  Extremities: no clubbing, bo cyanosis, trace pitting edema BLEs, symmetric  Neuro: No focal deficits.  Skin:  No rashes, lesions on clothed exam    Psych: Normal mood and affect.   Elise Benne, MD  Internal Medicine PGY-1  [phone]

## 2019-07-27 NOTE — Unmapped (Signed)
Pt alert and oriented.  NPO after MN for tunneled line placement. He will be a new start dialysis Pt. Blood sugar last night was 125 and coverage was provided. He used bipap for short time last night. No falls during the night.  Call bell within reach. Hourly rounds noted. Will continue to monitor.    Problem: Adult Inpatient Plan of Care  Goal: Plan of Care Review  Outcome: Progressing  Goal: Patient-Specific Goal (Individualization)  Outcome: Progressing  Goal: Absence of Hospital-Acquired Illness or Injury  Outcome: Progressing  Goal: Optimal Comfort and Wellbeing  Outcome: Progressing  Goal: Readiness for Transition of Care  Outcome: Progressing  Goal: Rounds/Family Conference  Outcome: Progressing     Problem: Adjustment to Illness (Chronic Kidney Disease)  Goal: Optimal Coping with Chronic Illness  Outcome: Progressing     Problem: Electrolyte Imbalance (Chronic Kidney Disease)  Goal: Electrolyte Balance  Outcome: Progressing     Problem: Fluid Volume Excess (Chronic Kidney Disease)  Goal: Fluid Balance  Outcome: Progressing     Problem: Functional Decline (Chronic Kidney Disease)  Goal: Optimal Functional Ability  Outcome: Progressing     Problem: Hematologic Alteration (Chronic Kidney Disease)  Goal: Absence of Anemia Signs/Symptoms  Outcome: Progressing     Problem: Anxiety  Goal: Anxiety Reduction or Resolution  Outcome: Progressing     Problem: Renal Function Impairment (Chronic Kidney Disease)  Goal: Laboratory Values and Blood Pressure Within Desired Range  Outcome: Progressing     Problem: Oral Intake Inadequate (Chronic Kidney Disease)  Goal: Optimal Oral Intake  Outcome: Progressing     Problem: Fall Injury Risk  Goal: Absence of Fall and Fall-Related Injury  Outcome: Progressing

## 2019-07-27 NOTE — Unmapped (Signed)
VIR POST PROCEDURE NOTE    Procedure:  Central Venous Access Device Placement  Date/Time: 07/27/2019/10:30 AM    Post-procedure Diagnosis: Need for HD    Time out: Prior to the procedure, a time out was performed with all team members present.  During the time out, the patient, procedure and procedure site when applicable were verbally verified.    Attending: Elsworth Soho, MD  Fellow: Belva Crome, MD    Sedation:Moderate  Estimated Blood Loss: Minimal  Specimens: None   Description of procedure: Successful placement of a tunneled right IJV Hemosplit catheter. No complications.    See detailed procedure note with images in PACS.    Belva Crome, MD  [facility] VIR Fellow  July 27, 2019 10:30 AM

## 2019-07-27 NOTE — Unmapped (Signed)
POC reviewed, 1st HD treatment for 2.5 hours, UF goal 1 L as tolerated, consent signed, monitor closely.  Problem: Device-Related Complication Risk (Hemodialysis)  Goal: Safe, Effective Therapy Delivery  Outcome: Ongoing - Unchanged     Problem: Hemodynamic Instability (Hemodialysis)  Goal: Vital Signs Remain in Desired Range  Outcome: Ongoing - Unchanged     Problem: Infection (Hemodialysis)  Goal: Absence of Infection Signs/Symptoms  Outcome: Ongoing - Unchanged

## 2019-07-27 NOTE — Unmapped (Signed)
[facility] Nephrology Hemodialysis Procedure Note     07/27/2019    Jeremy Oliver was seen and examined on hemodialysis    CHIEF COMPLAINT: End Stage Renal Disease    INTERVAL HISTORY: Received tunneled catheter today. Here for first iHD session. Little neck pain but otherwise feels okay.     DIALYSIS TREATMENT DATA:  Estimated Dry Weight (kg): 160 kg (352 lb 11.8 oz)  Patient Goal Weight (kg): 1 kg (2 lb 3.3 oz)  Dialyzer: F-180 (98 mLs)  Dialysis Bath  Bath: 3 K+ / 2.5 Ca+ (verified with NP)  Dialysate Na (mEq/L): 137 mEq/L  Dialysate HCO3 (mEq/L): 31 mEq/L  Dialysate Total Buffer HCO3 (mEq/L): 35 mEq/L  Blood Flow Rate (mL/min): 200 mL/min  Dialysis Flow (mL/min): 800 mL/min    PHYSICAL EXAM:  Vitals:  Temp:  [35.2 ??C-36.6 ??C] 36.6 ??C  Heart Rate:  [70-83] 70  SpO2 Pulse:  [70-81] 79  BP: (103-181)/(60-99) 122/73  MAP (mmHg):  [82-115] 93  Weights:  Pre-Treatment Weight (kg): 163.1 kg (359 lb 9.1 oz)    General: in no acute distress, currently dialyzing in a chair  Pulmonary: normal respiratory effort  Cardiovascular: regular rate and rhythm  Extremities: 1+  edema  Access: Right IJ tunneled catheter     LAB DATA:  Lab Results   Component Value Date    NA 136 07/27/2019    K 5.1 (H) 07/27/2019    CL 105 07/27/2019    CO2 18.0 (L) 07/27/2019    BUN 58 (H) 07/27/2019    CREATININE 6.70 (H) 07/27/2019    CALCIUM 8.0 (L) 07/27/2019    MG 1.8 07/27/2019    PHOS 7.6 (H) 07/27/2019    ALBUMIN 3.3 (L) 07/25/2019      Lab Results   Component Value Date    HCT 31.3 (L) 07/27/2019    WBC 9.6 07/27/2019        ASSESSMENT/PLAN:  End Stage Renal Disease on Intermittent Hemodialysis:  UF goal: 1 L as tolerated  Adjust medications for a GFR <10  Avoid nephrotoxic agents     Bone Mineral Metabolism:  Lab Results   Component Value Date    CALCIUM 8.0 (L) 07/27/2019    CALCIUM 8.3 (L) 07/26/2019    Lab Results   Component Value Date    ALBUMIN 3.3 (L) 07/25/2019    ALBUMIN 3.7 07/25/2019      Lab Results   Component Value Date    PHOS 7.6 (H) 07/27/2019    PHOS 7.2 (H) 07/26/2019    Lab Results   Component Value Date    PTH 403.4 (H) 07/25/2019    PTH 395.4 (H) 04/14/2019      Continue phosphorus binder and dietary counseling.    Anemia:   Lab Results   Component Value Date    HGB 9.6 (L) 07/27/2019    HGB 10.3 (L) 07/26/2019    HGB 9.9 (L) 07/25/2019    Iron Saturation (%)   Date Value Ref Range Status   07/25/2019 11 (L) 20 - 50 % Final      Lab Results   Component Value Date    FERRITIN 150.0 07/25/2019       Continue intravenous Ferric Gluconate 62.5mg  with each treatment. (starting 4/28)     Latrelle Dodrill, MD  [facility] Division of Nephrology & Hypertension

## 2019-07-28 LAB — BASIC METABOLIC PANEL
ANION GAP: 11 mmol/L (ref 7–15)
BLOOD UREA NITROGEN: 42 mg/dL — ABNORMAL HIGH (ref 7–21)
BUN / CREAT RATIO: 7
CHLORIDE: 103 mmol/L (ref 98–107)
CO2: 22 mmol/L (ref 22.0–30.0)
CREATININE: 5.72 mg/dL — ABNORMAL HIGH (ref 0.70–1.30)
EGFR CKD-EPI AA MALE: 12 mL/min/{1.73_m2} — ABNORMAL LOW (ref >=60)
EGFR CKD-EPI NON-AA MALE: 11 mL/min/{1.73_m2} — ABNORMAL LOW (ref >=60)
GLUCOSE RANDOM: 113 mg/dL (ref 70–179)
POTASSIUM: 5.3 mmol/L — ABNORMAL HIGH (ref 3.5–5.0)
SODIUM: 136 mmol/L (ref 135–145)

## 2019-07-28 LAB — PHOSPHORUS: Phosphate:MCnc:Pt:Ser/Plas:Qn:: 5.9 — ABNORMAL HIGH

## 2019-07-28 LAB — CBC
HEMATOCRIT: 29.4 % — ABNORMAL LOW (ref 41.0–53.0)
HEMOGLOBIN: 9.3 g/dL — ABNORMAL LOW (ref 13.5–17.5)
MEAN CORPUSCULAR HEMOGLOBIN CONC: 31.6 g/dL (ref 31.0–37.0)
MEAN PLATELET VOLUME: 8.6 fL (ref 7.0–10.0)
PLATELET COUNT: 334 10*9/L (ref 150–440)
RED BLOOD CELL COUNT: 3.49 10*12/L — ABNORMAL LOW (ref 4.50–5.90)
RED CELL DISTRIBUTION WIDTH: 15.8 % — ABNORMAL HIGH (ref 12.0–15.0)
WBC ADJUSTED: 8.7 10*9/L (ref 4.5–11.0)

## 2019-07-28 LAB — MAGNESIUM: Magnesium:MCnc:Pt:Ser/Plas:Qn:: 1.7

## 2019-07-28 LAB — CREATININE: Creatinine:MCnc:Pt:Ser/Plas:Qn:: 5.72 — ABNORMAL HIGH

## 2019-07-28 LAB — RED BLOOD CELL COUNT: Lab: 3.49 — ABNORMAL LOW

## 2019-07-28 NOTE — Unmapped (Signed)
Palliative Care Progress Note    Consultation from Requesting Attending Physician:  Sharlett Iles, MD  Primary Care Provider:  Kurtis Bushman, MD    Code status:   Code Status: Full Code   Healthcare decision-maker if lacks capacity:   [name],[name]    [name], [name],[name]       Assessment/Plan:    SUMMARY:  [name] is a very pleasant 49 y.o. 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 He is well known to this Clinical research associate as he is a patient of the [facility]). He has tolerated HD #1, plan for HD #2 today.    Palliative care has continued to follow along for symptom evaluation, GOC and support.     Symptom Assessment and Recommendations:    1. Acute pain related to recent tunneled line placement for dialysis access  --pt has stated acetaminophen has not provided any relief  --agree with oxycodone 5mg  PO and would recommend q4h prn administration in consideration of half life.    Goals of care / ACP:    Decisional capacity at time of visit: Jeremy Oliver remains engaged in decision making regarding his medical care.   ??  Health Care Decision Makers  ????Primary??[name]: [name],[name] - [phone]  ????[name], [name]  Living Will: Completed and on file 02/06/2017      Current goals of care:     --continue current focus of care and escalation as needed to prolong life    Please refer to previous notes for further detail. We did not explore GOC in detail again today. Jeremy Oliver and his wife have some differences of opinion when it comes to discontinuation of life prolonging treatments at the end of life. We will continue to discuss in future follow up. Jeremy Oliver has stated he Fann wish to complete living will in the future.    Practical, Emotional, Spiritual Support / Other Communication and Counseling:  Palliative care follow up with Jeremy Oliver today after HD catheter placement. He had begun dialysis treatment #1, was sleepy but easily awakened. We briefly checked in with him and agreed on plan to follow up in the coming days.    This Clinical research associate in contact with patient's wife [name] for extended supportive visit by phone. She had gone home to catch up on rest and other duties knowing that Jeremy Oliver would be in procedure and dialysis for a few hours.     She expressed hope that dialysis would eventually help Jeremy Oliver to feel better and that he would actually recognize the improvement. She expressed concern that he Bassett not wish to continue the treatment if it doesn't bring obvious and timely improvement to his overall state of being.    She looks forward to getting him home, settled and incorporating all of this new work in to their lives. She asks very thoughtful questions about diet, caring for his HD access, community HD treatments, symptoms and resources for ongoing support. She has expressed hope that this writer can continue to follow as they become more adjusted to community dialysis and all that it takes for [place] to adjust and succeed.     [name] is worried about Jeremy Oliver but also remains his primary source of encouragement and support. She is taking time to care for  herself and ready their home for his arrival. She is hopeful he Vandezande be able to come home early next week. She also anticipates that his cardiologist will eventually wish to evaluate the source of his chest pain, perhaps by catheterization, a procedure which Key require another hospital stay.    We have planned to continue following up with each other for support and to navigate the way forward.    We will continue to follow along with you. Please page [facility] [phone]) or Palliative Care [phone]) if there are any questions.     Subjective:   I'm okay but this line in my chest hurts. Tylenol doesn't even touch it. I am so tired.    New Events: none    Scheduled Meds:  ??? acetaminophen  1,000 mg Oral Q8H SCH   ??? allopurinoL  100 mg Oral BID   ??? amiodarone  200 mg Oral Nightly   ??? apixaban  5 mg Oral BID   ??? atorvastatin  80 mg Oral Daily   ??? carvediloL  25 mg Oral BID   ??? cholecalciferol (vitamin D3-125 mcg (5,000 unit))  25 mcg Oral Daily   ??? ezetimibe  10 mg Oral Nightly   ??? FLUoxetine  80 mg Oral Nightly   ??? gabapentin  300 mg Oral BID   ??? insulin glargine  35 Units Subcutaneous Nightly   ??? insulin lispro  0-12 Units Subcutaneous ACHS   ??? insulin lispro  10 Units Subcutaneous TID AC   ??? isosorbide mononitrate  30 mg Oral Daily   ??? lidocaine  1 patch Transdermal Daily   ??? magnesium oxide  400 mg Oral Daily   ??? pantoprazole  40 mg Oral Daily   ??? sevelamer  1,600 mg Oral 3xd Meals   ??? ticagrelor  90 mg Oral BID     Continuous Infusions:  PRN Meds:.albumin human, dextrose 50 % in water (D50W), diclofenac sodium, epoetin alfa-EPBX, gentamicin-sodium citrate, gentamicin-sodium citrate, hydrOXYzine, nicotine polacrilex, ondansetron, sodium chloride 0.9%, sodium ferric gluconate IVPB     Objective:   Seen on dialysis treatment. Jeremy Oliver appears to be sleeping but awakens easily to name. He is in NAD but has some discomfort for HD catheter placement.    Function:  50% - Ambulation: Mainly sit/lie / Unable to do any work, extensive disease / Self-Care:Considerable assistance required, Intake: Normal or reduced / Level of Conscious: Full or confusion    Temp:  [36.1 ??C (96.9 ??F)-36.6 ??C (97.8 ??F)] 36.2 ??C (97.2 ??F)  Heart Rate:  [70-80] 74  Resp:  [16-18] 18  BP: (100-135)/(57-77) 134/70  SpO2:  [95 %-100 %] 97 %    Physical Exam: deferred today    Test Results:  Lab Results   Component Value Date    WBC 8.7 07/28/2019    RBC 3.49 (L) 07/28/2019    HGB 9.3 (L) 07/28/2019    HCT 29.4 (L) 07/28/2019    MCV 84.3 07/28/2019    MCH 26.7 07/28/2019    MCHC 31.6 07/28/2019    RDW 15.8 (H) 07/28/2019    PLT 334 07/28/2019 MPV 8.6 07/28/2019     Lab Results   Component Value Date    NA 136 07/28/2019    K 5.3 (H) 07/28/2019    CL 103 07/28/2019    CO2 22.0 07/28/2019    BUN 42 (H) 07/28/2019    CREATININE 5.72 (H) 07/28/2019    GFR >= 60 01/09/2012    GLU 113 07/28/2019  CALCIUM 7.9 (L) 07/28/2019    ALBUMIN 3.3 (L) 07/25/2019    PHOS 5.9 (H) 07/28/2019      Lab Results   Component Value Date    ALKPHOS 89 04/01/2019    BILITOT 0.3 04/01/2019    BILIDIR 0.30 01/20/2019    PROT 5.7 (L) 04/01/2019    ALBUMIN 3.3 (L) 07/25/2019    ALT 8 07/25/2019    AST 12 (L) 04/01/2019       Total time spent with patient for evaluation & management (excluding ACP documented separately): 30 Minutes :    Greater than 50% of this time spent on counseling/coordination of care:  Yes.   See ACP Note from today for additional billable service:  No.     Anell Barr, DNP, ANP-BC  Pager [phone]

## 2019-07-28 NOTE — Unmapped (Signed)
HEMODIALYSIS NURSE PROCEDURE NOTE       Treatment Number:  2 Room / Station:  4    Procedure Date:  07/28/19 Device Name/Number: LATIFA    Total Dialysis Treatment Time:  181 Min.    CONSENT:    Written consent was obtained prior to the procedure and is detailed in the medical record.  Prior to the start of the procedure, a time out was taken and the identity of the patient was confirmed via name, medical record number and date of birth.     WEIGHT:  Hemodialysis Pre-Treatment Weights     Date/Time Pre-Treatment Weight (kg) Estimated Dry Weight (kg) Patient Goal Weight (kg) Total Goal Weight (kg)    07/28/19 1206  163.5 kg (360 lb 7.2 oz)  160 kg (352 lb 11.8 oz)  0 kg (0 lb)  0.55 kg (1 lb 3.4 oz)         Hemodialysis Post Treatment Weights     Date/Time Post-Treatment Weight (kg) Treatment Weight Change (kg)    07/27/19 1730  161.6 kg (356 lb 4.2 oz)  -1.5 kg        Active Dialysis Orders (168h ago, onward)     Start     Ordered    07/28/19 1129  Hemodialysis inpatient  Daily     Comments: This is treatment two of 3 consecutive treatments. Will rewrite orders each day.   Question Answer Comment   K+ 3 meq/L    Ca++ 2.5 meq/L    Bicarb 35 meq/L    Na+ 137 meq/L    Na+ Modeling No    Dialyzer F180NR    Dialysate Temperature (C) 36.5    BFR-As tolerated to a maximum of: Other (please specify) 250 ml/min   DFR 800 mL/min    Duration of treatment Other (Specify) 2.5 h   Dry weight (kg) 160 kg    Challenge dry weight (kg) No    Fluid removal (L) UF = 0 L on 07/28/2019    Tubing Adult = 142 ml    Access Site Dialysis Catheter    Access Site Location Right    Keep SBP >: 100 mmHg        07/28/19 1128    07/27/19 0600  Hemodialysis inpatient  Daily,   Status:  Canceled     Comments: This is treatment one of 3 consecutive treatments. Will rewrite orders each day.   Question Answer Comment   K+ 3 meq/L    Ca++ 2.5 meq/L    Bicarb 35 meq/L    Na+ 137 meq/L    Na+ Modeling No    Dialyzer F180NR    Dialysate Temperature (C) 36.5 BFR-As tolerated to a maximum of: Other (please specify) 200 ml/min   DFR 800 mL/min    Duration of treatment Other (Specify) 2.5 h   Dry weight (kg) 160 kg    Challenge dry weight (kg) No    Fluid removal (L) UF 1.0 L on 07/27/2019    Tubing Adult = 142 ml    Access Site Dialysis Catheter    Access Site Location Right    Keep SBP >: 100 mmHg        07/26/19 1659              ASSESSMENT:  General appearance: alert and no distress  Neurologic: Grossly normal  Lungs: diminished breath sounds LLL and LUL  Heart: regular rate and rhythm  Abdomen: normal findings: bowel sounds normal  Pulses:  2+ and symmetric.  Skin: Skin color, texture, turgor normal. No rashes or lesions    ACCESS SITE:       Hemodialysis Catheter 07/27/19 Right Internal jugular 2.2 mL 2.3 mL (Active)   Site Assessment Clean;Dry;Intact 07/28/19 1535   Proximal Lumen Status / Patency Blood Return - Brisk 07/28/19 1535   Proximal Lumen Intervention Deaccessed 07/28/19 1535   Medial Lumen Status / Patency Blood Return - Brisk 07/28/19 1535   Medial Lumen Intervention Deaccessed 07/28/19 1535   Dressing Intervention No intervention needed 07/28/19 1535   Dressing Status      Clean 07/28/19 1220   Verification by X-ray Yes 07/28/19 1220   Site Condition No complications 07/28/19 1535   Dressing Type CHG gel;Occlusive;Transparent 07/28/19 1535   Dressing Change Due 08/03/19 07/28/19 1535   Line Necessity Reviewed? Y 07/28/19 1535   Line Necessity Indications Yes - Hemodialysis 07/28/19 1535   Line Necessity Reviewed With Nephrology 07/28/19 1535           Catheter fill volumes:    Arterial: 2.2 mL Venous: 2.3 mL   Catheter filled with gentamicin citrate mg Citrate post procedure.     Patient Lines/Drains/Airways Status    Active Peripheral & Central Intravenous Access     Name:   Placement date:   Placement time:   Site:   Days:    Peripheral IV 07/27/19 Left Antecubital   07/27/19    0925    Antecubital   1    Peripheral IV 07/27/19 Anterior;Right Forearm 07/27/19    2123    Forearm   less than 1               LAB RESULTS:  Lab Results   Component Value Date    NA 136 07/28/2019    K 5.3 (H) 07/28/2019    CL 103 07/28/2019    CO2 22.0 07/28/2019    BUN 42 (H) 07/28/2019    CREATININE 5.72 (H) 07/28/2019    GLU 113 07/28/2019    GLUF 223 (H) 06/09/2014    CALCIUM 7.9 (L) 07/28/2019    CAION 4.53 07/02/2018    PHOS 5.9 (H) 07/28/2019    MG 1.7 07/28/2019    PTH 403.4 (H) 07/25/2019    IRON 32 (L) 07/25/2019    LABIRON 11 (L) 07/25/2019    TRANSFERRIN 223.8 07/25/2019    FERRITIN 150.0 07/25/2019    TIBC 282.0 07/25/2019     Lab Results   Component Value Date    WBC 8.7 07/28/2019    HGB 9.3 (L) 07/28/2019    HCT 29.4 (L) 07/28/2019    PLT 334 07/28/2019    PHART 7.31 (L) 04/07/2017    PO2ART 124.0 (H) 04/07/2017    PCO2ART 44.3 04/07/2017    HCO3ART 22 04/07/2017    BEART -3.4 (L) 04/07/2017    O2SATART 98.9 04/07/2017    APTT 40.6 (H) 04/01/2019        VITAL SIGNS:   Temperature     Date/Time Temp Temp src      07/28/19 1530  36.8 ??C (98.2 ??F)  Oral     07/28/19 1515  36.8 ??C (98.2 ??F)  Oral         Hemodynamics     Date/Time Pulse BP MAP (mmHg) Patient Position    07/28/19 1530  70  159/85  ???  Lying    07/28/19 1515  71  155/90  ???  Lying    07/28/19 1445  70  ???  ???  ???    07/28/19 1430  75  150/90  ???  Lying    07/28/19 1400  73  140/86  ???  Lying    07/28/19 1330  74  130/80  ???  Lying    07/28/19 1248  79  127/84  ???  Lying    07/28/19 1245  73  133/82  ???  Lying    07/28/19 1229  70  130/83  ???  Lying    07/28/19 1215  73  138/88  ???  Lying        Blood Volume Monitor     Date/Time Blood Volume Change (%) HCT HGB Critline O2 SAT %    07/28/19 1515  -3.9 %  28.9  9.8  69.4    07/28/19 1445  -3.2 %  28.7  9.8  68.1    07/28/19 1430  -2.6 %  28.6  9.7  65.8    07/28/19 1400  -2.7 %  28.6  9.7  64.2    07/28/19 1330  -0.7 %  28  9.5  68.3    07/28/19 1248  -1.5 %  28.2  9.6  64.9        Oxygen Therapy     Date/Time Resp SpO2 O2 Device O2 Flow Rate (L/min)    07/28/19 1530 18  96 %  None (Room air)  ???    07/28/19 1515  18  98 %  Nasal cannula  2 L/min    07/28/19 1445  18  ???  ???  ???    07/28/19 1430  18  ???  Nasal cannula  2 L/min    07/28/19 1400  19  98 %  Nasal cannula  2 L/min    07/28/19 1330  18  98 %  Nasal cannula  2 L/min    07/28/19 1248  19  98 %  Nasal cannula  2 L/min          Pre-Hemodialysis Assessment     Date/Time Therapy Number Dialyzer Hemodialysis Line Type All Machine Alarms Passed    07/28/19 1206  2  F-180 (98 mLs)  Adult (142 m/s)  Yes    Date/Time Air Detector Saline Line Double Clampled Hemo-Safe Applied Dialysis Flow (mL/min)    07/28/19 1206  Engaged  ???  ???  800 mL/min    Date/Time Verify Priming Solution Priming Volume Hemodialysis Independent pH Hemodialysis Machine Conductivity (mS/cm)    07/28/19 1206  0.9% NS  300 mL  ???  13.8 mS/cm    Date/Time Hemodialysis Independent Conductivity (mS/cm) Bicarb Conductivity Residual Bleach Negative Total Chlorine    07/28/19 1206  13.6 mS/cm --  Yes  0        Pre-Hemodialysis Treatment Comments     Date/Time Pre-Hemodialysis Comments    07/28/19 1206  Pt arrived via WC. Pt alert and stable, VS stable.        Hemodialysis Treatment     Date/Time Blood Flow Rate (mL/min) Arterial Pressure (mmHg) Venous Pressure (mmHg) Transmembrane Pressure (mmHg)    07/28/19 1515  250 mL/min  -83 mmHg  98 mmHg  45 mmHg    07/28/19 1445  250 mL/min  -76 mmHg  100 mmHg  46 mmHg    07/28/19 1430  250 mL/min  -86 mmHg  96 mmHg  45 mmHg    07/28/19 1400  250 mL/min  -85 mmHg  93 mmHg  43 mmHg    07/28/19  1345  250 mL/min  ???  ???  ???    07/28/19 1330  250 mL/min  -76 mmHg  78 mmHg  48 mmHg    07/28/19 1300  150 mL/min  -90 mmHg  90 mmHg  36 mmHg    07/28/19 1248  250 mL/min  -80 mmHg  84 mmHg  47 mmHg    07/28/19 1245  250 mL/min  -78 mmHg  83 mmHg  53 mmHg    07/28/19 1229  200 mL/min  -50 mmHg  30 mmHg  30 mmHg    Date/Time Ultrafiltration Rate (mL/hr) Ultrafiltrate Removed (mL) Dialysate Flow Rate (mL/min) KECN (Kecn)    07/28/19 1515  0 mL/hr 85 mL  800 ml/min  ???    07/28/19 1445  0 mL/hr  85 mL  800 ml/min  ???    07/28/19 1430  0 mL/hr  85 mL  800 ml/min  ???    07/28/19 1400  0 mL/hr  85 mL  800 ml/min  ???    07/28/19 1345  ???  ???  ???  ???    07/28/19 1330  0 mL/hr  85 mL  800 ml/min  ???    07/28/19 1300  220 mL/hr  85 mL  800 ml/min  ???    07/28/19 1248  220 mL/hr  68 mL  800 ml/min  ???    07/28/19 1245  220 mL/hr  57 mL  800 ml/min  ???    07/28/19 1229  220 mL/hr  0 mL  250 ml/min  ???        Hemodialysis Treatment Comments     Date/Time Intra-Hemodialysis Comments    07/28/19 1530  Alert,VSS,NAD,Blood returned    07/28/19 1515  VSS, NAD    07/28/19 1430  eyes closed, vs stable    07/28/19 1427  accucheck done, seen by Dr [id number] mg/dl    45/40/98 1191  No complaints , NAD,     07/28/19 1330  VSS    07/28/19 1300  Pt alert and orineted, offered Apple juice, VSS, pt displayed iimporvement , no complaints     07/28/19 1248  NP Yvette at bedside     07/28/19 1245  Pt complaints of lightheadedness, VS okay , Blood pressure retaken still within range , patient complaints of blurred vision, UF off CBG taken , NP Yvette notified    07/28/19 1229  Tx initiated. Pt stable. VS stable.    07/28/19 1215  Pre HD VS        Post Treatment     Date/Time Rinseback Volume (mL) On Line Clearance: spKt/V Total Liters Processed (L/min) Dialyzer Clearance    07/28/19 1530  300 mL  0.32 spKt/V  36.4 L/min  Moderately streaked    07/27/19 1730  300 mL  ???  ???  Moderately streaked        Post Hemodialysis Treatment Comments     Date/Time Post-Hemodialysis Comments    07/28/19 1530  Blood returned,VSS,NAD        Hemodialysis I/O     Date/Time Total Hemodialysis Replacement Volume (mL) Total Ultrafiltrate Output (mL)    07/28/19 1530  300 mL  0 mL          3213-3213-01 - Medicaitons Given During Treatment  (last 3 hrs)         RALPH WARREN P REYES, RN       Medication Name Action Time Action Route Rate Dose User     gentamicin-sodium citrate lock  solution in NS 07/28/19 1525 Given hemodialysis port injection  2.1 mL Jeri Cos, RN     gentamicin-sodium citrate lock solution in NS 07/28/19 1525 Given hemodialysis port injection  2.3 mL Jeri Cos, RN                  Patient tolerated treatment in a  Dialysis Recliner.

## 2019-07-28 NOTE — Unmapped (Signed)
HEMODIALYSIS NURSE PROCEDURE NOTE       Treatment Number:  1 Room / Station:  7    Procedure Date:  07/27/19 Device Name/Number: IGOR    Total Dialysis Treatment Time:  151 Min.    CONSENT:    Written consent was obtained prior to the procedure and is detailed in the medical record.  Prior to the start of the procedure, a time out was taken and the identity of the patient was confirmed via name, medical record number and date of birth.     WEIGHT:  Hemodialysis Pre-Treatment Weights     Date/Time Pre-Treatment Weight (kg) Estimated Dry Weight (kg) Patient Goal Weight (kg) Total Goal Weight (kg)    07/27/19 1434  163.1 kg (359 lb 9.1 oz)  160 kg (352 lb 11.8 oz)  1 kg (2 lb 3.3 oz)  1.55 kg (3 lb 6.7 oz)         Hemodialysis Post Treatment Weights     Date/Time Post-Treatment Weight (kg) Treatment Weight Change (kg)    07/27/19 1730  161.6 kg (356 lb 4.2 oz)  -1.5 kg        Active Dialysis Orders (168h ago, onward)     Start     Ordered    07/28/19 0600  Hemodialysis inpatient  Daily     Comments: This is treatment two of 3 consecutive treatments. Will rewrite orders each day.   Question Answer Comment   K+ 3 meq/L    Ca++ 2.5 meq/L    Bicarb 35 meq/L    Na+ 137 meq/L    Na+ Modeling No    Dialyzer F180NR    Dialysate Temperature (C) 36.5    BFR-As tolerated to a maximum of: Other (please specify) 250 ml/min   DFR 800 mL/min    Duration of treatment Other (Specify) 2.5 h   Dry weight (kg) 160 kg    Challenge dry weight (kg) No    Fluid removal (L) UF 0.5 L on 07/28/2019    Tubing Adult = 142 ml    Access Site Dialysis Catheter    Access Site Location Right    Keep SBP >: 100 mmHg        07/27/19 1858    07/27/19 0600  Hemodialysis inpatient  Daily,   Status:  Canceled     Comments: This is treatment one of 3 consecutive treatments. Will rewrite orders each day.   Question Answer Comment   K+ 3 meq/L    Ca++ 2.5 meq/L    Bicarb 35 meq/L    Na+ 137 meq/L    Na+ Modeling No    Dialyzer F180NR    Dialysate Temperature (C) 36.5    BFR-As tolerated to a maximum of: Other (please specify) 200 ml/min   DFR 800 mL/min    Duration of treatment Other (Specify) 2.5 h   Dry weight (kg) 160 kg    Challenge dry weight (kg) No    Fluid removal (L) UF 1.0 L on 07/27/2019    Tubing Adult = 142 ml    Access Site Dialysis Catheter    Access Site Location Right    Keep SBP >: 100 mmHg        07/26/19 1659              ASSESSMENT:  General appearance: alert  Neurologic: Alert and oriented X 3, normal strength and tone. Normal symmetric reflexes. Normal coordination and gait  Lungs: diminished breath sounds bilaterally  Heart:  S1, S2 normal  Abdomen: soft, non-tender; bowel sounds normal; no masses,  no organomegaly  Pulses: 2+ and symmetric.  Skin: Skin color, texture, turgor normal. No rashes or lesions    ACCESS SITE:       Hemodialysis Catheter 07/27/19 Right Internal jugular 2.2 mL 2.3 mL (Active)   Site Assessment Clean;Dry;Intact 07/27/19 1810   Proximal Lumen Status / Patency Capped 07/27/19 1810   Proximal Lumen Intervention Deaccessed 07/27/19 1810   Medial Lumen Status / Patency Capped 07/27/19 1810   Medial Lumen Intervention Deaccessed 07/27/19 1810   Dressing Intervention New dressing 07/27/19 1810   Dressing Status      Clean;Dry;Intact/not removed 07/27/19 1810   Verification by X-ray Yes 07/27/19 1810   Site Condition No complications 07/27/19 1810   Dressing Type CHG gel;Occlusive;Transparent 07/27/19 1810   Dressing Change Due 08/03/19 07/27/19 1810   Line Necessity Reviewed? Y 07/27/19 1810   Line Necessity Indications Yes - Hemodialysis 07/27/19 1810   Line Necessity Reviewed With MD 07/27/19 1810           Catheter fill volumes:    Arterial: 2.2 mL Venous: 2.3 mL   Catheter filled with Gent Citrate post procedure.     Patient Lines/Drains/Airways Status    Active Peripheral & Central Intravenous Access     Name:   Placement date:   Placement time:   Site:   Days:    Peripheral IV 07/27/19 Left Antecubital   07/27/19    0925 Antecubital   less than 1               LAB RESULTS:  Lab Results   Component Value Date    NA 136 07/27/2019    K 5.1 (H) 07/27/2019    CL 105 07/27/2019    CO2 18.0 (L) 07/27/2019    BUN 58 (H) 07/27/2019    CREATININE 6.70 (H) 07/27/2019    GLU 125 07/27/2019    GLUF 223 (H) 06/09/2014    CALCIUM 8.0 (L) 07/27/2019    CAION 4.53 07/02/2018    PHOS 7.6 (H) 07/27/2019    MG 1.8 07/27/2019    PTH 403.4 (H) 07/25/2019    IRON 32 (L) 07/25/2019    LABIRON 11 (L) 07/25/2019    TRANSFERRIN 223.8 07/25/2019    FERRITIN 150.0 07/25/2019    TIBC 282.0 07/25/2019     Lab Results   Component Value Date    WBC 9.6 07/27/2019    HGB 9.6 (L) 07/27/2019    HCT 31.3 (L) 07/27/2019    PLT 339 07/27/2019    PHART 7.31 (L) 04/07/2017    PO2ART 124.0 (H) 04/07/2017    PCO2ART 44.3 04/07/2017    HCO3ART 22 04/07/2017    BEART -3.4 (L) 04/07/2017    O2SATART 98.9 04/07/2017    APTT 40.6 (H) 04/01/2019        VITAL SIGNS:   Temperature     Date/Time Temp Temp src      07/27/19 1830  36.4 ??C (97.5 ??F)  Oral     07/27/19 1725  36.4 ??C (97.6 ??F)  Oral         Hemodynamics     Date/Time Pulse BP MAP (mmHg) Patient Position    07/27/19 1830  76  128/58  83  Lying    07/27/19 1810  78  129/73  ???  Sitting    07/27/19 1807  77  114/60  ???  Sitting    07/27/19 1800  70  100/74  ???  Sitting    07/27/19 1725  74  125/68  ???  Sitting    07/27/19 1716  77  121/57  ???  Sitting    07/27/19 1700  73  121/77  ???  Lying    07/27/19 1630  73  113/70  ???  Lying    07/27/19 1600  73  109/62  ???  Lying    07/27/19 1530  74  117/64  ???  Lying    07/27/19 1515  70  122/73  ???  ???        Blood Volume Monitor     Date/Time Blood Volume Change (%) HCT HGB Critline O2 SAT %    07/27/19 1716  -2.8 %  28.2  9.6  58.5    07/27/19 1700  -2.1 %  28  9.5  55.8    07/27/19 1630  -1.8 %  28  9.5  57.5    07/27/19 1600  0.6 %  27.3  9.3  59.8    07/27/19 1530  -0.8 %  27.7  9.4  59.7    07/27/19 1511  -1.3 %  27.8  9.5  62.7        Oxygen Therapy     Date/Time Resp SpO2 O2 Device FiO2 (%) O2 Flow Rate (L/min)    07/27/19 1830  18  97 %  None (Room air)  ???  ???    07/27/19 1810  18  ???  ???  ???  ???    07/27/19 1800  18  ???  ???  ???  ???    07/27/19 1725  18  ???  None (Room air)  ???  ???    07/27/19 1716  18  ???  None (Room air)  ???  ???    07/27/19 1700  18  ???  None (Room air)  ???  ???    07/27/19 1630  18  ???  None (Room air)  ???  ???    07/27/19 1600  16  ???  None (Room air)  ???  ???    07/27/19 1530  18  ???  None (Room air)  ???  ???          Pre-Hemodialysis Assessment     Date/Time Therapy Number Dialyzer Hemodialysis Line Type All Machine Alarms Passed    07/27/19 1434  1  F-180 (98 mLs)  Low Volume (6.35 mm 82 m/s)  Yes    Date/Time Air Detector Saline Line Double Clampled Hemo-Safe Applied Dialysis Flow (mL/min)    07/27/19 1434  Engaged  ???  ???  800 mL/min    Date/Time Verify Priming Solution Priming Volume Hemodialysis Independent pH Hemodialysis Machine Conductivity (mS/cm)    07/27/19 1434  0.9% NS  300 mL  ??? passes  13.8 mS/cm    Date/Time Hemodialysis Independent Conductivity (mS/cm) Bicarb Conductivity Residual Bleach Negative Total Chlorine    07/27/19 1434  13.6 mS/cm --  Yes  0        Pre-Hemodialysis Treatment Comments     Date/Time Pre-Hemodialysis Comments    07/27/19 1434  A/Ox4, VSS, NAD        Hemodialysis Treatment     Date/Time Blood Flow Rate (mL/min) Arterial Pressure (mmHg) Venous Pressure (mmHg) Transmembrane Pressure (mmHg)    07/27/19 1716  200 mL/min  -68 mmHg  79 mmHg  67 mmHg    07/27/19 1700  200 mL/min  -65 mmHg  77  mmHg  60 mmHg    07/27/19 1630  200 mL/min  -72 mmHg  75 mmHg  67 mmHg    07/27/19 1600  200 mL/min  -70 mmHg  77 mmHg  65 mmHg    07/27/19 1530  200 mL/min  -63 mmHg  72 mmHg  66 mmHg    07/27/19 1511  200 mL/min  -71 mmHg  74 mmHg  68 mmHg    07/27/19 1500  200 mL/min  -67 mmHg  72 mmHg  64 mmHg    07/27/19 1445  200 mL/min  -60 mmHg  70 mmHg  60 mmHg    Date/Time Ultrafiltration Rate (mL/hr) Ultrafiltrate Removed (mL) Dialysate Flow Rate (mL/min) KECN (Kecn)    07/27/19 1716  620 mL/hr  1550 mL  800 ml/min  ???    07/27/19 1700  620 mL/hr  1365 mL  800 ml/min  ???    07/27/19 1630  620 mL/hr  1053 mL  800 ml/min  ???    07/27/19 1600  620 mL/hr  748 mL  800 ml/min  ???    07/27/19 1530  620 mL/hr  435 mL  800 ml/min  ???    07/27/19 1511  620 mL/hr  244 mL  800 ml/min  ???    07/27/19 1500  620 mL/hr  135 mL  800 ml/min  ???    07/27/19 1445  620 mL/hr  0 mL  800 ml/min  ???        Hemodialysis Treatment Comments     Date/Time Intra-Hemodialysis Comments    07/27/19 1810  BP improved, pt. stated that he feels much better    07/27/19 1800  pt. c/o lightheadedness while waiting for transport, checked BP, seen by Dr. Stefano Gaul and ordered to give NS bolus    07/27/19 1716  tx completed    07/27/19 1700  tolerating tx well    07/27/19 1630  pt. alert, watching tv    07/27/19 1600  stable    07/27/19 1530  pt. resting with eyes closed, NAD    07/27/19 1511  seen by MD at chairside, no new orders    07/27/19 1500  NAD    07/27/19 1445  tx initiated per protocol        Post Treatment     Date/Time Rinseback Volume (mL) On Line Clearance: spKt/V Total Liters Processed (L/min) Dialyzer Clearance    07/27/19 1730  300 mL  ???  ???  Moderately streaked        Post Hemodialysis Treatment Comments     Date/Time Post-Hemodialysis Comments    07/27/19 1730  VSS, NAD, denies any complaints        Hemodialysis I/O     Date/Time Total Hemodialysis Replacement Volume (mL) Total Ultrafiltrate Output (mL)    07/27/19 1730  ???  1000 mL          3213-3213-01 - Medicaitons Given During Treatment  (last 4 hrs)         Freddrick March, RN       Medication Name Action Time Action Route Rate Dose User     insulin lispro (HumaLOG) injection 0-12 Units 07/27/19 1839 Hold Subcutaneous   Freddrick March, RN     lidocaine (LIDODERM) 5 % patch 1 patch 07/27/19 1842 Patch Applied Transdermal  1 patch Freddrick March, RN     sevelamer (RENVELA) tablet 1,600 mg 07/27/19 1842 Given Oral  1,600 mg Freddrick March, RN  Terah Robey Bonney Leitz, RN Medication Name Action Time Action Route Rate Dose User     epoetin alfa-EPBx (RETACRIT) injection 4,000 Units 07/27/19 1604 Given Intravenous  4,000 Units Jacalyn Lefevre K Jacey Eckerson, RN     gentamicin-sodium citrate lock solution in NS 07/27/19 1723 Given hemodialysis port injection  2.1 mL Jacalyn Lefevre K Heron Pitcock, RN     gentamicin-sodium citrate lock solution in NS 07/27/19 1722 Given hemodialysis port injection  2.3 mL Christie Nottingham Xzavion Doswell, RN                  Patient tolerated treatment in a  Dialysis Recliner.

## 2019-07-28 NOTE — Unmapped (Signed)
Into HDU via Wheelchair not in any distress, alert and oriented , No S/s of infection noted at R hemodialysis catheter,0Uf for 2.5 hours, machine alarm limits within normal range, Pt complaints of lightheadedness , and blurred vision UF Off done , CBG check (pt states that 85mg /dl is low for him) notified NP Yvette, Seen and examined, Apple juice given, CBG recheck done, UF resumed , Pt displayed improvement,intradilaytic monitoring continued. See Mar and treatment for more information.  Problem: Hemodynamic Instability (Hemodialysis)  Goal: Vital Signs Remain in Desired Range  Outcome: Ongoing - Unchanged     Problem: Infection (Hemodialysis)  Goal: Absence of Infection Signs/Symptoms  Outcome: Ongoing - Unchanged     Problem: Self-Care Deficit  Goal: Improved Ability to Complete Activities of Daily Living  Outcome: Ongoing - Unchanged

## 2019-07-28 NOTE — Unmapped (Signed)
[facility] Nephrology Hemodialysis Procedure Note     07/27/2019    Jeremy Oliver was seen and examined on hemodialysis    CHIEF COMPLAINT: End Stage Renal Disease    INTERVAL HISTORY:     Reevaluated the patient following dialysis. Complaining of dizziness and mild headache. SBP decreased to 100 mmHg.    DIALYSIS TREATMENT DATA:  Estimated Dry Weight (kg): 160 kg (352 lb 11.8 oz)  Patient Goal Weight (kg): 1 kg (2 lb 3.3 oz)  Dialyzer: F-180 (98 mLs)  Dialysis Bath  Bath: 3 K+ / 2.5 Ca+ (verified with NP)  Dialysate Na (mEq/L): 137 mEq/L  Dialysate HCO3 (mEq/L): 31 mEq/L  Dialysate Total Buffer HCO3 (mEq/L): 35 mEq/L  Blood Flow Rate (mL/min): 200 mL/min  Dialysis Flow (mL/min): 800 mL/min    PHYSICAL EXAM:  Vitals:  Temp:  [35.2 ??C (95.4 ??F)-36.6 ??C (97.8 ??F)] 36.6 ??C (97.8 ??F)  Heart Rate:  [70-83] 73  SpO2 Pulse:  [70-81] 79  BP: (103-181)/(60-99) 121/77  MAP (mmHg):  [82-115] 93  Weights:  Pre-Treatment Weight (kg): 163.1 kg (359 lb 9.1 oz)    General: fatigued, currently dialyzing in a chair  Pulmonary: decreased BS bases  Cardiovascular: irregular rate and rhythm  Extremities: 3+  edema  Access: Right IJ tunneled catheter     LAB DATA:  Lab Results   Component Value Date    NA 136 07/27/2019    K 5.1 (H) 07/27/2019    CL 105 07/27/2019    CO2 18.0 (L) 07/27/2019    BUN 58 (H) 07/27/2019    CREATININE 6.70 (H) 07/27/2019    CALCIUM 8.0 (L) 07/27/2019    MG 1.8 07/27/2019    PHOS 7.6 (H) 07/27/2019    ALBUMIN 3.3 (L) 07/25/2019      Lab Results   Component Value Date    HCT 31.3 (L) 07/27/2019    WBC 9.6 07/27/2019        ASSESSMENT/PLAN:  End Stage Renal Disease on Intermittent Hemodialysis:  Will give 500 ml bolus of NS and will reevaluate.    Gentry Fitz, MD   Division of Nephrology & Hypertension   07/27/2019  6:04 pm

## 2019-07-28 NOTE — Unmapped (Signed)
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  Decreasing Imdur and Coreg today.  -Potassium stable at 5.3 this morning.  -RR called for right shoulder pain and lightheadedness.  VSS, BG WNL neuro exam unremarkable.  Exam without signs of hematoma or infection.  No fever.  Bilateral breath sounds present.  Improved after oxycodone 10 mg x 1.  Of note patient has significant history of PTSD after prolonged TICU stay for ECMO during previous hospitalization with multiple subsequent RR for similar symptoms.     Assessment/Plan:   Jeremy Oliver is a 49 y.o. male with PMHx of CKD, CAD, AICD, DM, HTN, HLD, HFrEF (EF 35% w G1DD), TIA, A fib on xarelto that presented as a direct admission for new start dialysis in setting of refractory volume overload.    HFrEF (EF 35% w G1DD) w volume overload refractory to diuretics requiring HD - CKD: Followed in diuresis clinic for IV diuretics twice weekly in addition to home Bumex 5mg  BID but has had persistent weight gain and with worsening renal function (Cr 5.8 on day of admission) requiring new start dialysis. Wt was 360lbs on arrival, which has downtrended from 388lbs ~3weeks ago. Pt remains comfortable WOB on RA though some dependent rales on exam.  - New start HD labs ordered  - Stop home bumex 5mg  BID  - Coreg to 25 twice daily from 37.5  -Imdur to 30 mg from 60 mg  ??  OSA: BiPAP for sleep  ??  CAD - Paroxysmal Afib: S/p placement of 7 DES, most recently July 2020 to distal RCA. On ticagrelor. Some exertional CP at baseline, uses nitro PRN, last time ~2wks ago. No CP on arrival.  - EKG pending  - Continue ticagrelor in setting of recent stent placement in last year  -Transitioned from Xarelto to Eliquis given CKD  - Cont home amiodarone  ??  Hyperkalemia/Hyperphosphatemia: Potassium stable in the low fives and rechecks, despite refusing Lokelma. EKG without changes.   - Cont home sevelamer 1600mg  TID    Iron deficiency anemia:  Slightly anemic at 10.3. MCV 86. Iron sat 11%, ferritin 150.   - Iron with dialysis   ??  HTN: Cont home amlodipine, carvedilol, Imdur  T2DM: Home regimen lantus 45u at bedtime, humalog 22u TIDAC  - Lantus 35u at bedtime   - Lispro 10u TIDAC w SSI  - Home gabapentin for peripheral neuropathy 300mg  BID  Anxiety: Cont home fluoxetine, atarax  HLD: Cont home atorvastatin, ezetimibe   GERD: Cont home protonix    F: NMIVF  E: replete PRN  N: Regular Diet and NPO at MN    GI PPx: Home H2 Blocker  DVT PPx: Restarting home eliquis after tunelled  line  Access: PIV    Code Status: Full Code    Disposition: Nephrology (MDB) , Floor   ______________________________________________________________________      Objective:  Labs and Studies:  Labs and studies reviewed in EMR from previous 24 hours    Vitals Range Last 24 Hours:  Temp:  [36.1 ??C-36.6 ??C] 36.6 ??C  Heart Rate:  [70-79] 70  Resp:  [16-19] 18  BP: (100-150)/(57-90) 150/90  MAP (mmHg):  [74-87] 74  SpO2:  [95 %-100 %] 98 %    Ins/Outs Last 3 Shifts:  I/O last 3 completed shifts:  In: 700 [P.O.:200; I.V.:500]  Out: 5375 [Urine:4375; Other:1000]    Physical Exam:  Gen: WDWN, obese in NAD, alert, oriented, answers questions appropriately  HEENT: atraumatic, normocephalic, sclera anicteric, MMM  Heart: RRR, S1, S2, no M/R/G, no chest wall tenderness  Lungs: CTAB   Abdomen: Normoactive bowel sounds, soft,  NT  Extremities: no clubbing, bo cyanosis, trace pitting edema BLEs, symmetric  Neuro: No focal deficits.  Skin:  No rashes, lesions on clothed exam    Psych: Normal mood and affect.   Elise Benne, MD  Internal Medicine PGY-1  [phone]

## 2019-07-28 NOTE — Unmapped (Signed)
[facility] Nephrology Hemodialysis Procedure Note     07/28/2019    Jeremy Oliver was seen and examined on hemodialysis    CHIEF COMPLAINT: End Stage Renal Disease    INTERVAL HISTORY: 2nd session. Neck feel a bit better this afternoon. Had some blurry vision and lightheadedness at beginning of treatment, BG found to be 86, symptoms improved w/ apple juice and crackers.      DIALYSIS TREATMENT DATA:  Estimated Dry Weight (kg): 160 kg (352 lb 11.8 oz)  Patient Goal Weight (kg): 0 kg (0 lb)  Dialyzer: F-180 (98 mLs)  Dialysis Bath  Bath: 3 K+ / 2.5 Ca+  Dialysate Na (mEq/L): 136 mEq/L  Dialysate HCO3 (mEq/L): 31 mEq/L  Dialysate Total Buffer HCO3 (mEq/L): 35 mEq/L  Blood Flow Rate (mL/min): 250 mL/min  Dialysis Flow (mL/min): 800 mL/min    PHYSICAL EXAM:  Vitals:  Temp:  [36.1 ??C-36.8 ??C] 36.8 ??C  Heart Rate:  [70-79] 70  BP: (100-159)/(57-90) 159/85  MAP (mmHg):  [74-87] 74  Weights:  Pre-Treatment Weight (kg): 163.5 kg (360 lb 7.2 oz)    General: in no acute distress, currently dialyzing in a chair  Pulmonary: normal respiratory effort on RA   Cardiovascular: regular rate and rhythm  Extremities: 1+  edema  Access: Right IJ tunneled catheter     LAB DATA:  Lab Results   Component Value Date    NA 136 07/28/2019    K 5.3 (H) 07/28/2019    CL 103 07/28/2019    CO2 22.0 07/28/2019    BUN 42 (H) 07/28/2019    CREATININE 5.72 (H) 07/28/2019    CALCIUM 7.9 (L) 07/28/2019    MG 1.7 07/28/2019    PHOS 5.9 (H) 07/28/2019    ALBUMIN 3.3 (L) 07/25/2019      Lab Results   Component Value Date    HCT 29.4 (L) 07/28/2019    WBC 8.7 07/28/2019        ASSESSMENT/PLAN:  End Stage Renal Disease on Intermittent Hemodialysis:  UF goal: 0 L as tolerated given hypotension following 1 L UF yesterday afternoon.   Adjust medications for a GFR <10  Avoid nephrotoxic agents     Bone Mineral Metabolism:  Lab Results   Component Value Date    CALCIUM 7.9 (L) 07/28/2019    CALCIUM 8.0 (L) 07/27/2019    Lab Results   Component Value Date    ALBUMIN 3.3 (L) 07/25/2019    ALBUMIN 3.7 07/25/2019      Lab Results   Component Value Date    PHOS 5.9 (H) 07/28/2019    PHOS 7.6 (H) 07/27/2019    Lab Results   Component Value Date    PTH 403.4 (H) 07/25/2019    PTH 395.4 (H) 04/14/2019      Continue phosphorus binder and dietary counseling.    Anemia:   Lab Results   Component Value Date    HGB 9.3 (L) 07/28/2019    HGB 9.6 (L) 07/27/2019    HGB 10.3 (L) 07/26/2019    Iron Saturation (%)   Date Value Ref Range Status   07/25/2019 11 (L) 20 - 50 % Final      Lab Results   Component Value Date    FERRITIN 150.0 07/25/2019       Continue intravenous Ferric Gluconate 62.5mg  with each treatment. (starting 4/28)   Epo 4000 units     Latrelle Dodrill, MD  [facility] Division of Nephrology & Hypertension

## 2019-07-28 NOTE — Unmapped (Signed)
Pt's wife reporting in.  States pt started HD yesterday.  Plans to have it again today and tomorrow.  Pt doing well.  Pt taking Eliquis rx, off Xarelto.  Pt's new address:  [address]  [place], [place] [address]    Pt will call and reschedule SW appt w/ Diane Dolan-Soto as soon as dialysis schedule confirmed. [name] vrs T/Th/Sat.    Pt understands to continue to follow up w/ Dr. Andrey Farmer, Dr. Hulan Fess and Dialysis appts.

## 2019-07-28 NOTE — Unmapped (Signed)
Pt alert and oriented.  A New start dialysis Pt. After receiving his HD line had some issues with  treatment. He became dizzy and BP dropped. On unit he is more stable and vital signs are good.Marland Kitchen He did receive a 500 ml bolus while on the unit.  He hah more excruciating pain and Dr was notified to ask for additional pain medicine. Oxycodone 5 mg. was effective.  He did manage to get some sleep during the night.  Hourly rounds noted. Will continue to monitor.     Problem: Adult Inpatient Plan of Care  Goal: Plan of Care Review  Outcome: Progressing  Goal: Patient-Specific Goal (Individualization)  Outcome: Progressing  Goal: Absence of Hospital-Acquired Illness or Injury  Outcome: Progressing  Goal: Optimal Comfort and Wellbeing  Outcome: Progressing  Goal: Readiness for Transition of Care  Outcome: Progressing  Goal: Rounds/Family Conference  Outcome: Progressing     Problem: Adjustment to Illness (Chronic Kidney Disease)  Goal: Optimal Coping with Chronic Illness  Outcome: Progressing     Problem: Electrolyte Imbalance (Chronic Kidney Disease)  Goal: Electrolyte Balance  Outcome: Progressing     Problem: Fluid Volume Excess (Chronic Kidney Disease)  Goal: Fluid Balance  Outcome: Progressing     Problem: Renal Function Impairment (Chronic Kidney Disease)  Goal: Laboratory Values and Blood Pressure Within Desired Range  Outcome: Progressing     Problem: Oral Intake Inadequate (Chronic Kidney Disease)  Goal: Optimal Oral Intake  Outcome: Progressing     Problem: Device-Related Complication Risk (Hemodialysis)  Goal: Safe, Effective Therapy Delivery  Outcome: Progressing     Problem: Fall Injury Risk  Goal: Absence of Fall and Fall-Related Injury  Outcome: Progressing

## 2019-07-28 NOTE — Unmapped (Signed)
""  Order was placed for a \""PIV by Venous Access Team (VAT)\"".  Patient was assessed at bedside for placement of a PIV. Mask and protective eyewear were donned. Access was obtained. Blood return noted.  Dressing intact and device well secured.  Flushed with normal saline.  Pt advised to inform RN of any s/s of discomfort at the PIV site.    Workup / Procedure Time:  15 minutes        RN was notified.       Thank you,     Fredderick Erb RN Venous Access Team""

## 2019-07-29 LAB — BASIC METABOLIC PANEL
ANION GAP: 9 mmol/L (ref 7–15)
BLOOD UREA NITROGEN: 31 mg/dL — ABNORMAL HIGH (ref 7–21)
BUN / CREAT RATIO: 7
CALCIUM: 8.2 mg/dL — ABNORMAL LOW (ref 8.5–10.2)
CHLORIDE: 99 mmol/L (ref 98–107)
CO2: 25 mmol/L (ref 22.0–30.0)
CREATININE: 4.4 mg/dL — ABNORMAL HIGH (ref 0.70–1.30)
EGFR CKD-EPI NON-AA MALE: 15 mL/min/{1.73_m2} — ABNORMAL LOW (ref >=60)
GLUCOSE RANDOM: 129 mg/dL (ref 70–179)
POTASSIUM: 5 mmol/L (ref 3.5–5.0)
SODIUM: 133 mmol/L — ABNORMAL LOW (ref 135–145)

## 2019-07-29 LAB — MAGNESIUM
MAGNESIUM: 1.7 mg/dL (ref 1.6–2.2)
Magnesium:MCnc:Pt:Ser/Plas:Qn:: 1.7

## 2019-07-29 LAB — PHOSPHORUS: Phosphate:MCnc:Pt:Ser/Plas:Qn:: 6 — ABNORMAL HIGH

## 2019-07-29 LAB — HIV AB SUPPLEMENTAL INTERPRETATION: HIV 1 & 2 Ab:Prid:Pt:Ser/Plas/Bld:Nom:IA.rapid: NEGATIVE — AB

## 2019-07-29 LAB — BUN / CREAT RATIO: Urea nitrogen/Creatinine:MRto:Pt:Ser/Plas:Qn:: 7

## 2019-07-29 LAB — HIV SUPPLEMENTAL: HIV RNA QNT RSLT: NOT DETECTED

## 2019-07-29 NOTE — Unmapped (Signed)
Adult Nutrition Assessment Note    Visit Type: RN Consult  Reason for Visit: Education (Nutrition)    ASSESSMENT:  HPI: Per MD note: Jeremy Oliver??is a 49 y.o.??male??with PMHx of??CKD, CAD, AICD, DM, HTN, HLD, HFrEF (EF 35% w G1DD), TIA, A fib??on??xarelto??that presented??as a direct admission for new start dialysis in setting of refractory volume overload.    Nutrition Hx: Visited patient at bedside. Provided renal diet education. Reviewed sodium, phosphorus, and potassium. Patient reports he started cooking more meals at home at start of pandemic. Patient reports he enjoys grilling steak and asparagus. Patient notes he has made efforts to decrease sodium intake (does not salt food, chooses salad instead of pasta at [employer]). Reviewed strategies to decrease sodium intake. Patient with coca cola at bedside. Reviewed high phosphorus foods with patient, including dark colas. Patient states he prefers Sprite Zero and drinks it more often than other sodas. Reviewed portion sizes of foods including meats. Reviewed potassium content of foods. Patient asked appropriate questions and verbalized understanding.    Nutritionally pertinent meds, labs, hospital course reviewed.    Patient Lines/Drains/Airways Status    Active Wounds     Name:   Placement date:   Placement time:   Site:   Days:    Wound 01/07/19 Foot plantar left    01/07/19    0908    Foot   203    Wound 01/07/19 Toe (Comment which one) left 5th toe   01/07/19    0910    Toe (Comment which one)   203                 Current nutrition therapy order:   Nutrition Orders          Nutrition Therapy Renal starting at 04/28 1149           Nutrition Focused Physical Exam:   Nutrition Evaluation  Overall Impressions: Nutrition-Focused Physical Exam not indicated due to lack of malnutrition risk factors. (07/29/19 1059)  Nutrition Designation: Obese class III  (BMI > 39.99 kg/m2) (07/29/19 1059)      DIAGNOSIS:  Malnutrition Assessment using AND/ASPEN Clinical Characteristics:  Patient does not meet AND/ASPEN criteria for malnutrition at this time (07/29/19 1059)     Overall Nutrition Impression:  Food and nutrition-related knowledge deficit as related to nutrition related health issues as evidenced by need for nutrition education prior to discharge.     GOALS:  Food/Nutrition Knowledge, Lifestyle Modifications:       - Patient will be willing and ready to learn about prescribed diet of renal diet  - Patient will demonstrate verbal understanding of prescribed renal diet   - Patient will make appropriate nutritional choices with regards to prescribed diet of renal diet     RECOMMENDATIONS AND INTERVENTIONS:  1. Patient educated on renal diet.  2. Printed information given for patient to refer to post discharge.        RD Follow Up Parameters:  Signing off at this time (Please reconsult if needed)     Amalia Greenhouse, MPH, RD, LDN  Pager: [phone]

## 2019-07-29 NOTE — Unmapped (Addendum)
Outpatient Dialysis Schedule    Your schedule will be Tuesday, Thursday and Saturday at 10:20.      For your first treatment, Tuesday, Laday 4, you will need to arrive at 9:30 to complete paperwork.    Please bring your ID, insurance cards and all medications.    [facility] [place]  [address] [place], [place] [address]  Phone: [phone]  Fax: [phone]    Home Health Services    You have been accepted for Enhanced Home Health Services through [facility] [phone]).  They will see you with 48 hours of discharge.  Please call them at the number provided if you have any questions about this.

## 2019-07-29 NOTE — Unmapped (Signed)
Pt alert and oriented x4. Pain treated with prn oxycodone. Pt c/o slight dizziness but no changes in BP. Advised to call for assistance before getting up to use the bathroom.  Good UOP noted. BG monitored and insulin provided. VSS. Fall precaution maintained.  Call light and bedside table within reach. Will continue to monitor.    Problem: Adult Inpatient Plan of Care  Goal: Plan of Care Review  Outcome: Progressing  Goal: Patient-Specific Goal (Individualization)  Outcome: Progressing  Goal: Absence of Hospital-Acquired Illness or Injury  Outcome: Progressing  Goal: Optimal Comfort and Wellbeing  Outcome: Progressing  Goal: Readiness for Transition of Care  Outcome: Progressing  Goal: Rounds/Family Conference  Outcome: Progressing     Problem: Adjustment to Illness (Chronic Kidney Disease)  Goal: Optimal Coping with Chronic Illness  Outcome: Progressing     Problem: Fluid Volume Excess (Chronic Kidney Disease)  Goal: Fluid Balance  Outcome: Progressing     Problem: Electrolyte Imbalance (Chronic Kidney Disease)  Goal: Electrolyte Balance  Outcome: Progressing     Problem: Functional Decline (Chronic Kidney Disease)  Goal: Optimal Functional Ability  Outcome: Progressing     Problem: Hematologic Alteration (Chronic Kidney Disease)  Goal: Absence of Anemia Signs/Symptoms  Outcome: Progressing     Problem: Oral Intake Inadequate (Chronic Kidney Disease)  Goal: Optimal Oral Intake  Outcome: Progressing     Problem: Renal Function Impairment (Chronic Kidney Disease)  Goal: Laboratory Values and Blood Pressure Within Desired Range  Outcome: Progressing     Problem: Anxiety  Goal: Anxiety Reduction or Resolution  Outcome: Progressing     Problem: Fall Injury Risk  Goal: Absence of Fall and Fall-Related Injury  Outcome: Progressing     Problem: Hemodynamic Instability (Hemodialysis)  Goal: Vital Signs Remain in Desired Range  Outcome: Progressing     Problem: Self-Care Deficit  Goal: Improved Ability to Complete Activities of Daily Living  Outcome: Progressing

## 2019-07-29 NOTE — Unmapped (Signed)
Palliative Care Progress Note  SIGN OFF    Consultation from Requesting Attending Physician:  Sharlett Iles, MD  Primary Care Provider:  Kurtis Bushman, MD      Code Status: Full Code   Healthcare decision-maker if lacks capacity:   [name] [facility]): Bieker,[name] - [phone]    [name], [facility]: Glasco,Bridgette - Daughter - [phone]       Assessment/Plan:    SUMMARY:  Jeremy Oliver is a very pleasant 49 y.o. 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 He is well known to this Clinical research associate as he is a patient of the [facility]). He has tolerated initiation of dialysis and is preparing to discharge home this weekend. He has secured a community dialysis seat at [facility] on Tuesday, Thursday and Saturdays.    He anticipates readmission in the future to address his cardiac issues, potentially requiring catheterization and stenting.    Palliative care has continued to follow along for symptom evaluation, GOC and support.     Symptom Assessment and Recommendations:    1. Acute pain related to recent tunneled line placement for dialysis access. STABLE/IMPROVED  --continue oxycodone 5mg  PO and would recommend q4h prn administration in consideration of half life. Would not anticipate he would need this medication beyond a few days as tunneled catheter site should heal and improve.    Goals of care / ACP:    Decisional capacity at time of visit: Jeremy Oliver remains engaged in decision making regarding his medical care.   ??  Health Care Decision Makers  Primary??[name]: Jablon,[name] - [phone]  [facility] [name]: Radabaugh, [name] - [phone]  Living Will: Completed and on file 02/06/2017      Current goals of care:     --continue current focus of care and escalation as needed to prolong life    Please refer to previous notes for further detail. We did not explore GOC in detail again today. Jeremy Oliver and his wife have some differences of opinion when it comes to discontinuation of life prolonging treatments at the end of life. We will continue to discuss in future follow up. Jeremy Oliver has stated he Thayer wish to modify his living will in the future.    --Jeremy Oliver is welcome to continue following up with the [facility] in the future for ongoing GOC, support and symptom evaluation. He and his wife are aware of how to contact this Clinical research associate for follow up.    Practical, Emotional, Spiritual Support / Other Communication and Counseling:  Palliative care follow up with Jeremy Oliver today. He is feeling much improved and eager to get arrangements together to get home. He misses his dog very much. He has tolerated HD but notes there is still work to do to regulate his blood pressure and blood sugar. He appreciates the opportunity to resume PCS services as well as enhanced home health. He Lunt also consider re-enrolling in telehealth as he enjoyed this service before.     He would benefit from having a scale and BP cuff provided in order to engage in home monitoring.     Per pt's request, this Clinical research associate also present during discussion with [facility] liaison for description of services and plan for home visits. Mrs Maybury was also present for the discussion by phone.    As Jeremy Oliver is  preparing for discharge home and has no unmet needs at this time, we will sign off. Please page [facility] [phone]) or Palliative Care [phone]) if there are any questions.     Subjective:   I think I'm going home today or tomorrow!    New Events: none    Scheduled Meds:  ??? acetaminophen  1,000 mg Oral Q8H SCH   ??? allopurinoL  100 mg Oral BID   ??? amiodarone  200 mg Oral Nightly   ??? apixaban  5 mg Oral BID   ??? atorvastatin  80 mg Oral Daily   ??? carvediloL  25 mg Oral BID   ??? cholecalciferol (vitamin D3-125 mcg (5,000 unit))  25 mcg Oral Daily   ??? ezetimibe  10 mg Oral Nightly   ??? FLUoxetine  80 mg Oral Nightly   ??? gabapentin  300 mg Oral BID   ??? insulin glargine  30 Units Subcutaneous Nightly   ??? insulin lispro  0-12 Units Subcutaneous ACHS   ??? insulin lispro  10 Units Subcutaneous TID AC   ??? isosorbide mononitrate  30 mg Oral Daily   ??? lidocaine  1 patch Transdermal Daily   ??? magnesium oxide  400 mg Oral Daily   ??? pantoprazole  40 mg Oral Daily   ??? sevelamer  1,600 mg Oral 3xd Meals   ??? ticagrelor  90 mg Oral BID     Continuous Infusions:  PRN Meds:.albumin human, albuterol, dextrose 50 % in water (D50W), diclofenac sodium, epoetin alfa-EPBX, gentamicin-sodium citrate, gentamicin-sodium citrate, hydrOXYzine, nicotine polacrilex, ondansetron, oxyCODONE, sodium chloride 0.9%, sodium ferric gluconate IVPB     Objective:   Seen in room. Jeremy Oliver is in bed finishing nebulizer treatment. He is awake, alert, oriented x4, NAD. No visitors at bedside.     Function:  50% - Ambulation: Mainly sit/lie / Unable to do any work, extensive disease / Self-Care:Considerable assistance required, Intake: Normal or reduced / Level of Conscious: Full or confusion    Temp:  [35.7 ??C (96.2 ??F)-36.7 ??C (98 ??F)] 35.7 ??C (96.2 ??F)  Heart Rate:  [70-78] 70  Resp:  [20] 20  BP: (118-145)/(60-77) 128/65  SpO2:  [93 %-97 %] 96 %    Physical Exam:  GEN:??Alert, oriented??x4, chronically ill. Afebrile. NAD  EYES:??Sclera clear  CV:??RRR  RESP:??NWOB on room air, normal chest rise, diminished breath sounds bilaterally  GI:??obese, soft, NT, +BS  SKIN:??edema of abdomen, lower extremities but improved from previous assessment    Test Results:  Lab Results   Component Value Date    WBC 8.7 07/28/2019    RBC 3.49 (L) 07/28/2019    HGB 9.3 (L) 07/28/2019    HCT 29.4 (L) 07/28/2019    MCV 84.3 07/28/2019    MCH 26.7 07/28/2019    MCHC 31.6 07/28/2019    RDW 15.8 (H) 07/28/2019    PLT 334 07/28/2019    MPV 8.6 07/28/2019     Lab Results   Component Value Date    NA 133 (L) 07/29/2019    K 5.0 07/29/2019    CL 99 07/29/2019    CO2 25.0 07/29/2019    BUN 31 (H) 07/29/2019    CREATININE 4.40 (H) 07/29/2019    GFR >= 60 01/09/2012    GLU 129 07/29/2019    CALCIUM 8.2 (L) 07/29/2019    ALBUMIN 3.3 (L) 07/25/2019    PHOS 6.0 (H) 07/29/2019      Lab Results   Component Value Date    [facility]  89 04/01/2019    BILITOT 0.3 04/01/2019    BILIDIR 0.30 01/20/2019    PROT 5.7 (L) 04/01/2019    ALBUMIN 3.3 (L) 07/25/2019    ALT 8 07/25/2019    AST 12 (L) 04/01/2019       Total time spent with patient for evaluation & management (excluding ACP documented separately): 30 Minutes :    Greater than 50% of this time spent on counseling/coordination of care:  Yes.   See ACP Note from today for additional billable service:  No.     Anell Barr, DNP, ANP-BC  Pager [phone]

## 2019-07-29 NOTE — Unmapped (Signed)
This encounter was created in error - please disregard.

## 2019-07-29 NOTE — Unmapped (Signed)
Pt currently resting quietly in bed, eyes closed, no ASD noted.Pt A&O, VSS. Pt c/o increased pain at his new HD line this morning which was accompanied with lightheadedness and blurred vision, a Rapid response was called. RRT & MD assessed at the bedside. Pt received 1x dose of PRN pain medication, and shortly afterwards went to HD#2. Pt continues to c/o episodes of lightheadedness, MDB aware. Blood sugars monitored and insulin administered per order. Pt was free from falls or injury this shift. Bed in low and locked position. Call bell and tray table within reach. Will continue to monitor.    Problem: Adult Inpatient Plan of Care  Goal: Plan of Care Review  Outcome: Progressing  Goal: Patient-Specific Goal (Individualization)  Outcome: Progressing  Goal: Absence of Hospital-Acquired Illness or Injury  Outcome: Progressing  Goal: Optimal Comfort and Wellbeing  Outcome: Progressing  Goal: Readiness for Transition of Care  Outcome: Progressing  Goal: Rounds/Family Conference  Outcome: Progressing     Problem: Adjustment to Illness (Chronic Kidney Disease)  Goal: Optimal Coping with Chronic Illness  Outcome: Progressing     Problem: Electrolyte Imbalance (Chronic Kidney Disease)  Goal: Electrolyte Balance  Outcome: Progressing     Problem: Fluid Volume Excess (Chronic Kidney Disease)  Goal: Fluid Balance  Outcome: Progressing     Problem: Functional Decline (Chronic Kidney Disease)  Goal: Optimal Functional Ability  Outcome: Progressing     Problem: Hematologic Alteration (Chronic Kidney Disease)  Goal: Absence of Anemia Signs/Symptoms  Outcome: Progressing     Problem: Oral Intake Inadequate (Chronic Kidney Disease)  Goal: Optimal Oral Intake  Outcome: Progressing     Problem: Renal Function Impairment (Chronic Kidney Disease)  Goal: Laboratory Values and Blood Pressure Within Desired Range  Outcome: Progressing     Problem: Anxiety  Goal: Anxiety Reduction or Resolution  Outcome: Progressing     Problem: Fall Injury Risk  Goal: Absence of Fall and Fall-Related Injury  Outcome: Progressing     Problem: Device-Related Complication Risk (Hemodialysis)  Goal: Safe, Effective Therapy Delivery  Outcome: Progressing     Problem: Hemodynamic Instability (Hemodialysis)  Goal: Vital Signs Remain in Desired Range  Outcome: Progressing     Problem: Infection (Hemodialysis)  Goal: Absence of Infection Signs/Symptoms  Outcome: Progressing     Problem: Self-Care Deficit  Goal: Improved Ability to Complete Activities of Daily Living  Outcome: Progressing

## 2019-07-29 NOTE — Unmapped (Signed)
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  This resolves after laying down or after several minutes of being upright.  Orthostatic vital signs unremarkable.  Blood pressures and blood sugars during these periods are normal.  O[place]guera be just equilibrating to normotension.  Holding off on adjusting blood pressure regimen further for now.  -Potassium 5.8 this morning.  Dialysis today.    Assessment/Plan:   Jeremy Oliver is a 49 y.o. male with PMHx of CKD, CAD, AICD, DM, HTN, HLD, HFrEF (EF 35% w G1DD), TIA, A fib on xarelto that presented as a direct admission for new start dialysis in setting of refractory volume overload.    ESRD - New Start Dialysis - HFrEF  Followed in diuresis clinic for IV diuretics twice weekly in addition to home Bumex 5mg  BID but has had persistent weight gain and with worsening renal function (Cr 5.8 on day of admission) requiring new start dialysis. Wt was 360lbs on arrival, which has downtrended from 388lbs ~3weeks ago. Pt remains comfortable WOB on RA though some dependent rales on exam.  - Coreg to 25 twice daily from 37.5  - Imdur to 30 mg from 60 mg  - New start HD labs ordered  - Stop home bumex 5mg  BID  ??  OSA: BiPAP for sleep??    CAD - Paroxysmal Afib: S/p placement of 7 DES, most recently July 2020 to distal RCA. On ticagrelor. Some exertional CP at baseline, uses nitro PRN, last time ~2wks ago. No CP on arrival.  - EKG pending  - Continue ticagrelor in setting of recent stent placement in last year  -Transitioned from Xarelto to Eliquis given CKD  - Cont home amiodarone  ??  Hyperkalemia/Hyperphosphatemia: Potassium stable in the low fives and rechecks, despite refusing Lokelma. EKG without changes.   - Cont home sevelamer 1600mg  TID    Iron deficiency anemia:  Slightly anemic at 10.3. MCV 86. Iron sat 11%, ferritin 150.   - Iron with dialysis   ??  HTN: Cont home amlodipine; titrating carvedilol, Imdur per HFrEF above  T2DM: Home regimen lantus 45u at bedtime, humalog 22u TIDAC  - Lantus 35u at bedtime   - Lispro 10u TIDAC w SSI  - Home gabapentin for peripheral neuropathy 300mg  BID  Anxiety: Cont home fluoxetine, atarax  HLD: Cont home atorvastatin, ezetimibe   GERD: Cont home protonix    F: NMIVF  E: replete PRN  N: Regular Diet and NPO at MN    GI PPx: Home H2 Blocker  DVT PPx: Restarting home eliquis after tunelled line  Access: PIV    Code Status: Full Code    Disposition: Nephrology (MDB) , Floor   ______________________________________________________________________  Objective:  Labs and Studies:  Labs and studies reviewed in EMR from previous 24 hours    Vitals Range Last 24 Hours:  Temp:  [35.7 ??C-36.8 ??C] 35.7 ??C  Heart Rate:  [70-78] 70  Resp:  [18-20] 20  BP: (118-159)/(60-90) 128/65  MAP (mmHg):  [83-102] 91  SpO2:  [93 %-98 %] 96 %    Ins/Outs Last 3 Shifts:  I/O last 3 completed shifts:  In: 800 [I.V.:500; Other:300]  Out: 3225 [Urine:3225]    Physical Exam:  Gen: WDWN, obese in NAD, alert, oriented, answers questions appropriately  HEENT: atraumatic, normocephalic, sclera anicteric, MMM  Heart: RRR, S1, S2, no M/R/G, no chest wall tenderness  Lungs: CTAB   Abdomen: Normoactive bowel sounds, soft,  NT  Extremities: no clubbing, bo cyanosis, trace pitting edema BLEs, symmetric  Neuro: No focal deficits.  Skin:  No rashes, lesions on clothed exam    Psych: Normal mood and affect.   Elise Benne, MD  Internal Medicine PGY-1  [phone]

## 2019-07-30 LAB — BASIC METABOLIC PANEL
ANION GAP: 11 mmol/L (ref 7–15)
BLOOD UREA NITROGEN: 37 mg/dL — ABNORMAL HIGH (ref 7–21)
CALCIUM: 8.3 mg/dL — ABNORMAL LOW (ref 8.5–10.2)
CHLORIDE: 100 mmol/L (ref 98–107)
CO2: 23 mmol/L (ref 22.0–30.0)
EGFR CKD-EPI AA MALE: 15 mL/min/{1.73_m2} — ABNORMAL LOW (ref >=60)
EGFR CKD-EPI NON-AA MALE: 13 mL/min/{1.73_m2} — ABNORMAL LOW (ref >=60)
GLUCOSE RANDOM: 143 mg/dL (ref 70–179)
POTASSIUM: 5.1 mmol/L — ABNORMAL HIGH (ref 3.5–5.0)
SODIUM: 134 mmol/L — ABNORMAL LOW (ref 135–145)

## 2019-07-30 LAB — CHLORIDE: Chloride:SCnc:Pt:Ser/Plas:Qn:: 100

## 2019-07-30 LAB — PHOSPHORUS: Phosphate:MCnc:Pt:Ser/Plas:Qn:: 6.1 — ABNORMAL HIGH

## 2019-07-30 LAB — MAGNESIUM: Magnesium:MCnc:Pt:Ser/Plas:Qn:: 2

## 2019-07-30 MED ORDER — NICOTINE (POLACRILEX) 4 MG GUM
BUCCAL | 0 refills | 5.00000 days | Status: CP | PRN
Start: 2019-07-30 — End: 2019-08-29
  Filled 2019-07-30: qty 110, 5d supply, fill #0

## 2019-07-30 MED ORDER — ISOSORBIDE MONONITRATE ER 30 MG TABLET,EXTENDED RELEASE 24 HR
ORAL_TABLET | Freq: Every day | ORAL | 3 refills | 120 days | Status: CP
Start: 2019-07-30 — End: ?
  Filled 2019-07-30: qty 30, 30d supply, fill #0

## 2019-07-30 MED ORDER — INSULIN ASPART (U-100) 100 UNIT/ML (3 ML) SUBCUTANEOUS PEN
0 refills | 0 days | Status: CP
Start: 2019-07-30 — End: ?
  Filled 2019-07-30: qty 15, 43d supply, fill #0

## 2019-07-30 MED ORDER — CARVEDILOL 25 MG TABLET
ORAL_TABLET | Freq: Two times a day (BID) | ORAL | 0 refills | 30.00000 days | Status: CP
Start: 2019-07-30 — End: 2019-08-29

## 2019-07-30 MED ORDER — APIXABAN 5 MG TABLET
ORAL_TABLET | Freq: Two times a day (BID) | ORAL | 11 refills | 30 days | Status: CP
Start: 2019-07-30 — End: ?
  Filled 2019-07-30: qty 60, 30d supply, fill #0

## 2019-07-30 MED ORDER — INSULIN DETEMIR (U-100) 100 UNIT/ML (3 ML) SUBCUTANEOUS PEN
0 refills | 0 days | Status: CP
Start: 2019-07-30 — End: ?

## 2019-07-30 MED FILL — LEVEMIR FLEXTOUCH U-100 INSULIN 100 UNIT/ML (3 ML) SUBCUTANEOUS PEN: 43 days supply | Qty: 15 | Fill #0 | Status: AC

## 2019-07-30 MED FILL — ELIQUIS 5 MG TABLET: 30 days supply | Qty: 60 | Fill #0 | Status: AC

## 2019-07-30 MED FILL — NICOTINE (POLACRILEX) 4 MG GUM: 5 days supply | Qty: 110 | Fill #0 | Status: AC

## 2019-07-30 MED FILL — ISOSORBIDE MONONITRATE ER 30 MG TABLET,EXTENDED RELEASE 24 HR: 30 days supply | Qty: 30 | Fill #0 | Status: AC

## 2019-07-30 MED FILL — CHOLECALCIFEROL (VITAMIN D3) 25 MCG (1,000 UNIT) TABLET: 100 days supply | Qty: 100 | Fill #0 | Status: AC

## 2019-07-30 NOTE — Unmapped (Signed)
Pt alert and oriented x4. Denies having pain/dizziness. VSS. BG monitored . Good UOP noted. Pt refused his CPAP at bedtime. Says he is fine without it. He will dialyze in the morning and discharge after treatment. He remains free from falls/injuries. Call light within reach. Will continue to monitor.    Problem: Adult Inpatient Plan of Care  Goal: Plan of Care Review  Outcome: Progressing  Goal: Patient-Specific Goal (Individualization)  Outcome: Progressing  Goal: Absence of Hospital-Acquired Illness or Injury  Outcome: Progressing  Goal: Optimal Comfort and Wellbeing  Outcome: Progressing  Goal: Readiness for Transition of Care  Outcome: Progressing  Goal: Rounds/Family Conference  Outcome: Progressing     Problem: Electrolyte Imbalance (Chronic Kidney Disease)  Goal: Electrolyte Balance  Outcome: Progressing     Problem: Fluid Volume Excess (Chronic Kidney Disease)  Goal: Fluid Balance  Outcome: Progressing     Problem: Adjustment to Illness (Chronic Kidney Disease)  Goal: Optimal Coping with Chronic Illness  Outcome: Progressing     Problem: Renal Function Impairment (Chronic Kidney Disease)  Goal: Laboratory Values and Blood Pressure Within Desired Range  Outcome: Progressing     Problem: Anxiety  Goal: Anxiety Reduction or Resolution  Outcome: Progressing     Problem: Fall Injury Risk  Goal: Absence of Fall and Fall-Related Injury  Outcome: Progressing     Problem: Self-Care Deficit  Goal: Improved Ability to Complete Activities of Daily Living  Outcome: Progressing     Problem: Infection (Hemodialysis)  Goal: Absence of Infection Signs/Symptoms  Outcome: Progressing     Problem: Heart Failure Comorbidity  Goal: Maintenance of Heart Failure Symptom Control  Outcome: Progressing     Problem: Hypertension Comorbidity  Goal: Blood Pressure in Desired Range  Outcome: Progressing     Problem: Obstructive Sleep Apnea Risk or Actual (Comorbidity Management)  Goal: Unobstructed Breathing During Sleep  Outcome: Progressing

## 2019-07-30 NOTE — Unmapped (Signed)
Closely monitor vital signs with Hd  WOF HD comlications amd keep vital signs within set param,eters  Plan to do 3 hours with 400 ml UF

## 2019-07-30 NOTE — Unmapped (Signed)
Pt currently resting quietly in bed, eyes closed, no ASD noted. Pt A&O, VSS, denies pain. Blood sugars monitored and insulin administered per order. Orthostatic vitals completed, see note. PRN albuterol given once for wheezing with good effect. Pt was free from falls or injury this shift. Bed in low and locked position. Call bell and tray table within reach. Will continue to monitor.     Problem: Adult Inpatient Plan of Care  Goal: Plan of Care Review  Outcome: Progressing  Goal: Patient-Specific Goal (Individualization)  Outcome: Progressing  Goal: Absence of Hospital-Acquired Illness or Injury  Outcome: Progressing  Goal: Optimal Comfort and Wellbeing  Outcome: Progressing  Goal: Readiness for Transition of Care  Outcome: Progressing  Goal: Rounds/Family Conference  Outcome: Progressing     Problem: Adjustment to Illness (Chronic Kidney Disease)  Goal: Optimal Coping with Chronic Illness  Outcome: Progressing     Problem: Electrolyte Imbalance (Chronic Kidney Disease)  Goal: Electrolyte Balance  Outcome: Progressing     Problem: Fluid Volume Excess (Chronic Kidney Disease)  Goal: Fluid Balance  Outcome: Progressing     Problem: Functional Decline (Chronic Kidney Disease)  Goal: Optimal Functional Ability  Outcome: Progressing     Problem: Hematologic Alteration (Chronic Kidney Disease)  Goal: Absence of Anemia Signs/Symptoms  Outcome: Progressing     Problem: Oral Intake Inadequate (Chronic Kidney Disease)  Goal: Optimal Oral Intake  Outcome: Progressing     Problem: Renal Function Impairment (Chronic Kidney Disease)  Goal: Laboratory Values and Blood Pressure Within Desired Range  Outcome: Progressing     Problem: Anxiety  Goal: Anxiety Reduction or Resolution  Outcome: Progressing     Problem: Fall Injury Risk  Goal: Absence of Fall and Fall-Related Injury  Outcome: Progressing     Problem: Device-Related Complication Risk (Hemodialysis)  Goal: Safe, Effective Therapy Delivery  Outcome: Progressing     Problem: Hemodynamic Instability (Hemodialysis)  Goal: Vital Signs Remain in Desired Range  Outcome: Progressing     Problem: Infection (Hemodialysis)  Goal: Absence of Infection Signs/Symptoms  Outcome: Progressing     Problem: Self-Care Deficit  Goal: Improved Ability to Complete Activities of Daily Living  Outcome: Progressing     Problem: Diabetes Comorbidity  Goal: Blood Glucose Level Within Desired Range  Outcome: Progressing     Problem: Heart Failure Comorbidity  Goal: Maintenance of Heart Failure Symptom Control  Outcome: Progressing     Problem: Hypertension Comorbidity  Goal: Blood Pressure in Desired Range  Outcome: Progressing     Problem: Obstructive Sleep Apnea Risk or Actual (Comorbidity Management)  Goal: Unobstructed Breathing During Sleep  Outcome: Progressing

## 2019-07-30 NOTE — Unmapped (Signed)
[facility] Nephrology Hemodialysis Procedure Note     07/30/2019    Jeremy Oliver was seen and examined on hemodialysis    CHIEF COMPLAINT: End Stage Renal Disease    INTERVAL HISTORY: No dyspnea    DIALYSIS TREATMENT DATA:  Estimated Dry Weight (kg): 160 kg (352 lb 11.8 oz)  Patient Goal Weight (kg): 0.4 kg (14.1 oz)  Dialyzer: F-180 (98 mLs)  Dialysis Bath  Bath: 3 K+ / 2.5 Ca+  Dialysate Na (mEq/L): 137 mEq/L  Dialysate HCO3 (mEq/L): 31 mEq/L  Dialysate Total Buffer HCO3 (mEq/L): 35 mEq/L  Blood Flow Rate (mL/min): 300 mL/min  Dialysis Flow (mL/min): 800 mL/min    PHYSICAL EXAM:  Vitals:  Temp:  [35.7 ??C (96.3 ??F)-36.4 ??C (97.5 ??F)] 36.4 ??C (97.5 ??F)  Heart Rate:  [70-78] 76  BP: (123-165)/(59-82) 144/82  MAP (mmHg):  [85-102] 85  Weights:  Pre-Treatment Weight (kg): 164.3 kg (362 lb 3.5 oz)    General: in no acute distress, currently dialyzing in a chair  Pulmonary: decreased BS bases  Cardiovascular: regular rate and rhythm  Extremities: 1+  edema  Access: Right IJ tunneled catheter     LAB DATA:  Lab Results   Component Value Date    NA 134 (L) 07/30/2019    K 5.1 (H) 07/30/2019    CL 100 07/30/2019    CO2 23.0 07/30/2019    BUN 37 (H) 07/30/2019    CREATININE 4.72 (H) 07/30/2019    CALCIUM 8.3 (L) 07/30/2019    MG 2.0 07/30/2019    PHOS 6.1 (H) 07/30/2019    ALBUMIN 3.3 (L) 07/25/2019      Lab Results   Component Value Date    HCT 29.4 (L) 07/28/2019    WBC 8.7 07/28/2019        ASSESSMENT/PLAN:  End Stage Renal Disease on Intermittent Hemodialysis:  UF goal: 0.4 L as tolerated  Adjust medications for a GFR <10  Avoid nephrotoxic agents     Bone Mineral Metabolism:  Lab Results   Component Value Date    CALCIUM 8.3 (L) 07/30/2019    CALCIUM 8.2 (L) 07/29/2019    Lab Results   Component Value Date    ALBUMIN 3.3 (L) 07/25/2019    ALBUMIN 3.7 07/25/2019      Lab Results   Component Value Date    PHOS 6.1 (H) 07/30/2019    PHOS 6.0 (H) 07/29/2019    Lab Results   Component Value Date    PTH 403.4 (H) 07/25/2019    PTH 395.4 (H) 04/14/2019      Continue phosphorus binder and dietary counseling.    Anemia:   Lab Results   Component Value Date    HGB 9.3 (L) 07/28/2019    HGB 9.6 (L) 07/27/2019    HGB 10.3 (L) 07/26/2019    Iron Saturation (%)   Date Value Ref Range Status   07/25/2019 11 (L) 20 - 50 % Final      Lab Results   Component Value Date    FERRITIN 150.0 07/25/2019       Continue epoetin 4000 units three times weekly.    Gentry Fitz, MD  [facility] Division of Nephrology & Hypertension   07/30/2019

## 2019-07-30 NOTE — Unmapped (Signed)
HEMODIALYSIS NURSE PROCEDURE NOTE    Treatment Number:  3 Room/Station:  6 Procedure Date:  07/30/19   Total Treatment Time:  185 Min.    CONSENT:  Written consent was obtained prior to the procedure and is detailed in the medical record. Prior to the start of the procedure, a time out was taken and the identity of the patient was confirmed via name, medical record number and date of birth.     WEIGHTS:  Hemodialysis Pre-Treatment Weights     Date/Time Pre-Treatment Weight (kg) Estimated Dry Weight (kg) Patient Goal Weight (kg) Total Goal Weight (kg)    07/30/19 0719  164.3 kg (362 lb 3.5 oz)  160 kg (352 lb 11.8 oz)  0.4 kg (14.1 oz)  0.95 kg (2 lb 1.5 oz)           Hemodialysis Post Treatment Weights     Date/Time Post-Treatment Weight (kg) Treatment Weight Change (kg)    07/30/19 1050  163.1 kg (359 lb 9.1 oz)  -1.2 kg        Active Dialysis Orders (168h ago, onward)     Start     Ordered    07/30/19 0600  Hemodialysis inpatient  Daily     Comments: This is treatment 3 of 3 consecutive treatments. Will rewrite orders each day.   Question Answer Comment   K+ 3 meq/L    Ca++ 2.5 meq/L    Bicarb 35 meq/L    Na+ 137 meq/L    Na+ Modeling No    Dialyzer F180NR    Dialysate Temperature (C) 36.5    BFR-As tolerated to a maximum of: 300 mL/min    DFR 800 mL/min    Duration of treatment 3 Hr    Dry weight (kg) 160 kg    Challenge dry weight (kg) No    Fluid removal (L) UF = 400 ml on 07/30/2019    Tubing Adult = 142 ml    Access Site Dialysis Catheter    Access Site Location Right    Keep SBP >: 100 mmHg        07/29/19 1547    07/27/19 0600  Hemodialysis inpatient  Daily,   Status:  Canceled     Comments: This is treatment one of 3 consecutive treatments. Will rewrite orders each day.   Question Answer Comment   K+ 3 meq/L    Ca++ 2.5 meq/L    Bicarb 35 meq/L    Na+ 137 meq/L    Na+ Modeling No    Dialyzer F180NR    Dialysate Temperature (C) 36.5    BFR-As tolerated to a maximum of: Other (please specify) 200 ml/min   DFR 800 mL/min    Duration of treatment Other (Specify) 2.5 h   Dry weight (kg) 160 kg    Challenge dry weight (kg) No    Fluid removal (L) UF 1.0 L on 07/27/2019    Tubing Adult = 142 ml    Access Site Dialysis Catheter    Access Site Location Right    Keep SBP >: 100 mmHg        07/26/19 1659              ACCESS SITE:       Hemodialysis Catheter 07/27/19 Right Internal jugular 2.2 mL 2.3 mL (Active)   Site Assessment Clean;Dry;Intact 07/30/19 1050   Proximal Lumen Status / Patency Capped 07/30/19 1050   Proximal Lumen Intervention Deaccessed 07/30/19 1050   Medial Lumen Status /  Patency Cap Changed 07/30/19 1050   Medial Lumen Intervention Deaccessed 07/30/19 1050   Dressing Intervention Dressing changed 07/30/19 1050   Dressing Status      Clean 07/29/19 2100   Verification by X-ray Yes 07/28/19 1220   Site Condition No complications 07/30/19 1050   Dressing Type CHG gel;Transparent 07/30/19 1050   Dressing Change Due 08/06/19 07/30/19 1050   Line Necessity Reviewed? Y 07/30/19 1050   Line Necessity Indications Yes - Hemodialysis 07/30/19 1050   Line Necessity Reviewed With Medb 07/30/19 1050           Catheter Fill Volumes:  Arterial:  2.2 mL Venous:  2.3 mL   Catheter filled with Gentamycin citrate lock post procedure.    Patient Lines/Drains/Airways Status    Active Peripheral & Central Intravenous Access     Name:   Placement date:   Placement time:   Site:   Days:    Peripheral IV 07/27/19 Anterior;Right Forearm   07/27/19    2123    Forearm   2              LAB RESULTS:  Lab Results   Component Value Date    NA 134 (L) 07/30/2019    K 5.1 (H) 07/30/2019    CL 100 07/30/2019    CO2 23.0 07/30/2019    BUN 37 (H) 07/30/2019    CREATININE 4.72 (H) 07/30/2019    GLU 143 07/30/2019    GLUF 223 (H) 06/09/2014    CALCIUM 8.3 (L) 07/30/2019    CAION 4.53 07/02/2018    PHOS 6.1 (H) 07/30/2019    MG 2.0 07/30/2019    PTH 403.4 (H) 07/25/2019    IRON 32 (L) 07/25/2019    LABIRON 11 (L) 07/25/2019    TRANSFERRIN 223.8 07/25/2019    FERRITIN 150.0 07/25/2019    TIBC 282.0 07/25/2019     Lab Results   Component Value Date    WBC 8.7 07/28/2019    HGB 9.3 (L) 07/28/2019    HCT 29.4 (L) 07/28/2019    PLT 334 07/28/2019    PHART 7.31 (L) 04/07/2017    PO2ART 124.0 (H) 04/07/2017    PCO2ART 44.3 04/07/2017    HCO3ART 22 04/07/2017    BEART -3.4 (L) 04/07/2017    O2SATART 98.9 04/07/2017    APTT 40.6 (H) 04/01/2019        VITAL SIGNS:  Temperature     Date/Time Temp Temp src      07/30/19 1045  36.5 ??C (97.7 ??F)  Oral         Hemodynamics     Date/Time Pulse BP MAP (mmHg) Patient Position    07/30/19 1045  77  151/89  ???  Lying    07/30/19 1030  78  153/82  ???  Lying    07/30/19 1000  72  160/75  ???  Lying    07/30/19 0930  77  149/63  ???  Lying    07/30/19 0900  74  156/83  ???  Lying    07/30/19 0830  74  141/48  ???  Lying    07/30/19 0800  75  164/98  ???  Lying    07/30/19 0740  76  144/82  ???  Lying    07/30/19 0722  77  165/81  ???  Lying        Blood Volume Monitor     Date/Time Blood Volume Change (%) HCT HGB Critline O2 SAT %  07/30/19 1045  -8.2 %  31  10.5  65    07/30/19 1030  -8 %  30.9  10.5  65.2    07/30/19 1000  -7.9 %  30.9  10.5  64.8    07/30/19 0930  -6.7 %  30.5  10.4  63.2    07/30/19 0900  -5.7 %  30.2  10.3  64.5    07/30/19 0830  -3.9 %  29.6  10.1  62.3    07/30/19 0800  -2.1 %  29.1  9.9  54.8        Oxygen Therapy     Date/Time Resp SpO2 O2 Device O2 Flow Rate (L/min)    07/30/19 1045  16  ???  ???  ???    07/30/19 1030  18  ???  None (Room air)  ???    07/30/19 1000  18  ???  None (Room air)  ???    07/30/19 0930  16  ???  None (Room air)  ???    07/30/19 0900  17  ???  None (Room air)  ???    07/30/19 0830  18  ???  None (Room air)  ???    07/30/19 0800  17  ???  None (Room air)  ???        Oxygen Connected to Wall:  no    Pre-Hemodialysis Assessment     Date/Time Therapy Number Dialyzer All Machine Alarms Passed Air Detector Dialysis Flow (mL/min)    07/30/19 0719  3  F-180 (98 mLs)  Yes  Engaged  800 mL/min    Date/Time Verify Priming Solution Priming Volume Hemodialysis Independent pH Hemodialysis Machine Conductivity (mS/cm) Hemodialysis Independent Conductivity (mS/cm)    07/30/19 0719  0.9% NS  300 mL  ???  13.7 mS/cm  13.7 mS/cm    Date/Time Bicarb Conductivity Residual Bleach Negative Free Chlorine Total Chlorine Chloramine    07/30/19 0719 --  Yes --  0 --        Pre-Hemodialysis Treatment Comments     Date/Time Pre-Hemodialysis Comments    07/30/19 0719  pt alert,orientedx4 came in a w/c        Hemodialysis Treatment     Date/Time Blood Flow Rate (mL/min) Arterial Pressure (mmHg) Venous Pressure (mmHg) Transmembrane Pressure (mmHg)    07/30/19 1045  0 mL/min  35 mmHg  19 mmHg  17 mmHg    07/30/19 1030  300 mL/min  -117 mmHg  122 mmHg  46 mmHg    07/30/19 1000  300 mL/min  -112 mmHg  134 mmHg  47 mmHg    07/30/19 0930  300 mL/min  -106 mmHg  136 mmHg  46 mmHg    07/30/19 0900  300 mL/min  -110 mmHg  129 mmHg  46 mmHg    07/30/19 0830  300 mL/min  -101 mmHg  131 mmHg  48 mmHg    07/30/19 0800  300 mL/min  -102 mmHg  116 mmHg  46 mmHg    07/30/19 0740  300 mL/min  -90 mmHg  110 mmHg  50 mmHg    Date/Time Ultrafiltration Rate (mL/hr) Ultrafiltrate Removed (mL) Dialysate Flow Rate (mL/min) KECN (Kecn)    07/30/19 1045  0 mL/hr  1039 mL  0 ml/min  ???    07/30/19 1030  370 mL/hr  967 mL  800 ml/min  ???    07/30/19 1000  370 mL/hr  787 mL  800 ml/min  ???    07/30/19 0930  370 mL/hr  605 mL  800 ml/min  ???    07/30/19 0900  370 mL/hr  420 mL  800 ml/min  ???    07/30/19 0830  320 mL/hr  261 mL  800 ml/min  ???    07/30/19 0800  320 mL/hr  105 mL  800 ml/min  ???    07/30/19 0740  320 mL/hr  0 mL  800 ml/min  ???        Hemodialysis Treatment Comments     Date/Time Intra-Hemodialysis Comments    07/30/19 1045  Treatment completed.rinsed with NS    07/30/19 1030  stable    07/30/19 1000  stable,resting comfortably    07/30/19 0930  stable    07/30/19 0900  stable,eyes closed    07/30/19 0830  stable,resting comfortably    07/30/19 0800  stable    07/30/19 0740  Treatment started per md order        Post Treatment     Date/Time Rinseback Volume (mL) On Line Clearance: spKt/V Total Liters Processed (L/min) Dialyzer Clearance    07/30/19 1050  300 mL  0.47 spKt/V  49.7 L/min  Moderately streaked          Post Hemodialysis Treatment Comments     Date/Time Post-Hemodialysis Comments    07/30/19 1050  stabl;e post HD        POST TREATMENT ASSESSMENT:  General appearance:  alert  Neurological:  Grossly normal  Lungs:  clear to auscultation bilaterally  Hearts:  regular rate and rhythm, S1, S2 normal, no murmur, click, rub or gallop  Abdomen:  soft, non-tender; bowel sounds normal; no masses,  no organomegaly  Pulses:  2+ and symmetric.  Skin:  Skin color, texture, turgor normal. No rashes or lesions    Hemodialysis I/O     Date/Time Total Hemodialysis Replacement Volume (mL) Total Ultrafiltrate Output (mL)    07/30/19 1050  ???  400 mL        3213-3213-01 - Medicaitons Given During Treatment  (last 3 hrs)         Toribio Harbour, RN       Medication Name Action Time Action Route Rate Dose User     gentamicin-sodium citrate lock solution in NS 07/30/19 1053 Given hemodialysis port injection  2.1 mL Toribio Harbour, RN     gentamicin-sodium citrate lock solution in NS 07/30/19 1053 Given hemodialysis port injection  2.3 mL Toribio Harbour, RN     sodium ferric gluconate (FERRLECIT) 62.5 mg in sodium chloride (NS) 0.9 % 100 mL IVPB 07/30/19 0853 New Bag Intravenous 115 mL/hr 62.5 mg Toribio Harbour, RN     sodium ferric gluconate (FERRLECIT) 62.5 mg in sodium chloride (NS) 0.9 % 100 mL IVPB 07/30/19 0950 Stopped Intravenous   Toribio Harbour, RN                  Patient tolerated treatment in a  Dialysis Recliner.

## 2019-07-31 ENCOUNTER — Encounter: Admit: 2019-07-31 | Discharge: 2019-08-29 | Payer: MEDICARE

## 2019-07-31 ENCOUNTER — Inpatient Hospital Stay: Admit: 2019-07-31 | Discharge: 2019-08-29 | Payer: MEDICARE

## 2019-07-31 MED ORDER — CHOLECALCIFEROL (VITAMIN D3) 25 MCG (1,000 UNIT) TABLET
ORAL_TABLET | Freq: Every day | ORAL | 11 refills | 100 days | Status: CP
Start: 2019-07-31 — End: 2020-07-30
  Filled 2019-07-30: qty 100, 100d supply, fill #0

## 2019-07-31 NOTE — Unmapped (Signed)
Physician Discharge Summary [facility]  3 [facility]  [address]  [place] [address]  Dept: [phone]  Loc: [phone]     Identifying Information:   Jeremy Oliver  25-Oct-1970  [id number]    Primary Care Physician: Kurtis Bushman, MD   Code Status: Full Code    Admit Date: 07/25/2019    Discharge Date: 07/30/2019     Discharge To: Home    Discharge Service: [facility] - Nephrology Floor Team (MEDB)     Discharge Attending Physician: No att. 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 *      Outpatient Provider Follow Up Issues:   [ ]  patient inquired about a new left foot brace, unable to obtain prior to  His discharge  [ ]  follow up with his weights and volume status, was discharged without resuming his home Bumex since starting HD and having lower blood pressures. He will have home health (CHF pathway) and instructions to add back Bumex at a low dose (2 mg a day) if weight begins to increase  [ ]  repeat BMP and Mg   [ ]  follow up his blood pressure--> held amlodipine, decreased coreg and imdur given lower blood pressures during dialysis. Kruser need adjustment as he becomes more used to dialysis   [ ]  Check BG. Requiring less insulin than prescribed at home, decreased meal time and long acting on discharge    Hospital Course:   Jeremy Route Yon??is a 49 y.o.??male??with PMHx of??CKD, CAD, AICD, DM, HTN, HLD, HFrEF (EF 35% w G1DD), TIA, A fib??on??xarelto??that presented??as a direct admission for new start dialysis in setting of refractory volume overload. Prior had been getting IV diuresis along with home diuretics. However due to worsening renal function and weight gain he was admitted for new start dialysis. He tolerated his dialysis sessions however had some lower blood pressures since starting so his blood pressure medications were adjusted on discharge. His Bumex was also held on discharge due to episodic feeling of lightheadedness and softer blood pressures and improvement in swelling with dialysis. However he could benefit from resuming Bumex and will be set up with home health (CHF pathway) to monitor his weights.   ??  OSA:??BiPAP for sleep??  ??  CAD - Paroxysmal Afib: S/p placement of 7 DES, most recently July 2020 to distal RCA. Continued Ticagrelor, transitioned from Xarelto to Apixaban in setting of his kidney function.   ??  Hyperkalemia/Hyperphosphatemia: Cont home sevelamer 1600mg  TID. Potassium stable with dialysis so discontinued home SPS.   ??  Iron deficiency anemia: Slightly anemic at 10.3. MCV 86. Iron sat 11%, ferritin 150. Received iron with dialysis   ??  HTN: Holding home Amlodipine on discharge, decreased home coreg and Imdur.     T2DM: Decreased home lantus and mealtime on discharge. Home gabapentin for peripheral neuropathy 300mg  BID    Anxiety: Cont home fluoxetine,??atarax    HLD: Cont home atorvastatin, ezetimibe     GERD: Cont home protonix    Touchbase with Outpatient Provider:  Warm Handoff: Completed on 07/30/19 by Azucena Freed  (Resident) via In-Basket Message    Procedures:  Dialysis, permacath placement   No admission procedures for hospital encounter.  ______________________________________________________________________  Discharge Medications:     Your Medication List      STOP taking these medications    amLODIPine 10 MG tablet  Commonly known as: NORVASC     bumetanide 2 MG tablet  Commonly known as: BUMEX     cholecalciferol (vitamin D3-125 mcg (5,000 unit)) 125 mcg (5,000 unit) capsule  Replaced by: cholecalciferol (vitamin D3 25 mcg (1,000 units)) 1,000 unit (25 mcg) tablet     SPS (WITH SORBITOL) 15-20 gram/60 mL Susp  Generic drug: sodium polystyrene (SPS) with sorbitol     XARELTO 15 mg Tab  Generic drug: rivaroxaban        START taking these medications    cholecalciferol (vitamin D3 25 mcg (1,000 units)) 1,000 unit (25 mcg) tablet  Take 1 tablet (25 mcg total) by mouth daily.  Start taking on: Shaddock 2, 2021  Replaces: cholecalciferol (vitamin D3-125 mcg (5,000 unit)) 125 mcg (5,000 unit) capsule     ELIQUIS 5 mg Tab  Generic drug: apixaban  Take 1 tablet (5 mg total) by mouth Two (2) times a day.     nicotine polacrilex 4 MG gum  Commonly known as: NICORETTE  Apply 1 each (4 mg total) to cheek every hour as needed for smoking cessation.        CHANGE how you take these medications    carvediloL 25 MG tablet  Commonly known as: COREG  Take 1 tablet (25 mg total) by mouth Two (2) times a day.  What changed: how much to take     isosorbide mononitrate 30 MG 24 hr tablet  Commonly known as: IMDUR  Take 1 tablet (30 mg total) by mouth daily.  What changed: how much to take     LEVEMIR FLEXTOUCH U-100 INSULN 100 unit/mL (3 mL) injection pen  Generic drug: insulin detemir U-100  Inject 35 units total under the skin nightly  What changed:   ?? how much to take  ?? how to take this  ?? when to take this  ?? additional instructions     NovoLOG Flexpen U-100 Insulin 100 unit/mL (3 mL) injection pen  Generic drug: insulin ASPART  Inject 0.15 (15 units) under skin three times a day with meals  What changed:   ?? how much to take  ?? how to take this  ?? when to take this  ?? additional instructions     ondansetron 4 MG tablet  Commonly known as: ZOFRAN Take 1 tablet (4 mg total) by mouth every eight (8) hours as needed for nausea for up to 4 days.  What changed: Another medication with the same name was removed. Continue taking this medication, and follow the directions you see here.        CONTINUE taking these medications    acetaminophen 500 MG tablet  Commonly known as: TYLENOL  TAKE 2 TABLETS (1 000MG ) BY MOUTH EVERY 8 HOURS AS NEEDED FOR PAIN  allopurinoL 100 MG tablet  Commonly known as: ZYLOPRIM  Take 2 tablets (200 mg total) by mouth daily.     amiodarone 200 MG tablet  Commonly known as: PACERONE  TAKE 1 TABLET BY MOUTH ONCE DAILY     atorvastatin 80 MG tablet  Commonly known as: LIPITOR  Take 1 tablet (80 mg total) by mouth daily.     BLOOD PRESSURE CUFF Misc  Generic drug: miscellaneous medical supply  Measure BP once a day or if any symptoms     blood sugar diagnostic Strp  Disp to match current ins approved meter. Use to test blood sugar four times daily. E11.22, Z79.4     blood-glucose meter kit  Disp. blood glucose meter kit preferred by patient's insurance. Dx: Diabetes, E11.9     BRILINTA 90 mg Tab  Generic drug: ticagrelor  TAKE 1 TABLET BY MOUTH TWICE DAILY     diclofenac sodium 1 % gel  Commonly known as: VOLTAREN  Apply 2 grams topically Three (3) times a day.     ezetimibe 10 mg tablet  Commonly known as: ZETIA  TAKE 1 TABLET BY MOUTH ONCE DAILY     FLUoxetine 40 MG capsule  Commonly known as: PROzac  Take 2 capsules (80 mg total) by mouth daily.     gabapentin 300 MG capsule  Commonly known as: NEURONTIN  Take 1 capsule (300 mg total) by mouth two (2) times a day.     hydrOXYzine 10 MG tablet  Commonly known as: ATARAX  Take 1 tablet (10 mg total) by mouth nightly as needed for anxiety.     insulin syringe-needle U-100 0.3 mL 31 gauge x 5/16 (8 mm) Syrg  Use to inject insulin twice daily with meals     lancets Misc  Disp. Ins preferred lancets. Use to test blood sugar four times daily. E11.22, Z79.4     loperamide 2 mg capsule  Commonly known as: IMODIUM  Take 1 capsule (2 mg total) by mouth 4 (four) times a day as needed for diarrhea.     nitroglycerin 0.4 MG SL tablet  Commonly known as: NITROSTAT  Place 1 tablet (0.4 mg total) under the tongue every five (5) minutes as needed for chest pain.     omeprazole 40 MG capsule  Commonly known as: PriLOSEC  Take 1 capsule (40 mg total) by mouth Two (2) times a day (30 minutes before a meal).     OZEMPIC 0.25 mg or 0.5 mg(2 mg/1.5 mL) Pnij  Generic drug: semaglutide  Inject 0.5 mg under the skin every seven (7) days.     PRALUENT PEN 150 mg/mL subcutaneous injection  Generic drug: alirocumab  Inject the contents of 1 pen (150 mg total) under the skin every fourteen (14) days.     RENVELA 800 mg tablet  Generic drug: sevelamer  TAKE 2 TABLETS (1600MG ) BY MOUTH THREE TIMES DAILY WITH MEALS     UNIFINE PENTIPS 31 gauge x 5/16 (8 mm) Ndle  Generic drug: pen needle, diabetic  USE WITH INSULIN AND VICTOZA 5 TIMES DAILY     VENTOLIN HFA 90 mcg/actuation inhaler  Generic drug: albuterol  Inhale 2 puffs into the lungs every 6 (six) hours as needed for wheezing or shortness of breath.            Allergies:  Losartan and Opioids - morphine analogues  ______________________________________________________________________  Pending Test Results (if blank, then none):      Most Recent Labs:  All lab  results last 24 hours -   Recent Results (from the past 24 hour(s))   POCT Glucose    Collection Time: 07/30/19 12:29 AM   Result Value Ref Range    Glucose, POC 113 70 - 179 mg/dL   Basic metabolic panel    Collection Time: 07/30/19  5:19 AM   Result Value Ref Range    Sodium 134 (L) 135 - 145 mmol/L    Potassium 5.1 (H) 3.5 - 5.0 mmol/L    Chloride 100 98 - 107 mmol/L    CO2 23.0 22.0 - 30.0 mmol/L    Anion Gap 11 7 - 15 mmol/L    BUN 37 (H) 7 - 21 mg/dL    Creatinine 7.82 (H) 0.70 - 1.30 mg/dL    BUN/Creatinine Ratio 8     EGFR CKD-EPI Non-African American, Male 13 (L) >=60 mL/min/1.75m2    EGFR CKD-EPI African American, Male 15 (L) >=60 mL/min/1.54m2    Glucose 143 70 - 179 mg/dL    Calcium 8.3 (L) 8.5 - 10.2 mg/dL   Magnesium Level    Collection Time: 07/30/19  5:19 AM   Result Value Ref Range    Magnesium 2.0 1.6 - 2.2 mg/dL   Phosphorus Level    Collection Time: 07/30/19  5:19 AM   Result Value Ref Range    Phosphorus 6.1 (H) 2.9 - 4.7 mg/dL   POCT Glucose    Collection Time: 07/30/19  6:21 AM   Result Value Ref Range    Glucose, POC 163 70 - 179 mg/dL   POCT Glucose    Collection Time: 07/30/19 12:07 PM   Result Value Ref Range    Glucose, POC 120 70 - 179 mg/dL       Relevant Studies/Radiology (if blank, then none):  ECG 12 Lead    Result Date: 07/30/2019  SINUS RHYTHM WITH 1ST DEGREE AV BLOCK T WAVE ABNORMALITY, CONSIDER LATERAL ISCHEMIA ABNORMAL ECG WHEN COMPARED WITH ECG OF 25-Jul-2019 18:14, NO SIGNIFICANT CHANGE WAS FOUND Confirmed by Pollyann Kennedy ([id number]) on 07/30/2019 12:02:21 AM    ECG 12 Lead    Result Date: 07/26/2019  SINUS RHYTHM WITH 1ST DEGREE AV BLOCK LEFT VENTRICULAR HYPERTROPHY WITH REPOLARIZATION ABNORMALITY ( R in aVL ) PROLONGED QT ABNORMAL ECG WHEN COMPARED WITH ECG OF 01-Apr-2019 01:48, MINIMAL CRITERIA FOR SEPTAL INFARCT  ARE NO LONGER PRESENT Confirmed by Warnell Forester ([id number]) on 07/26/2019 11:27:06 AM    IR Insert Tunneled Catheter (Age Greater Than 5 Years)    Result Date: 07/27/2019  EXAM: TUNNELED, HEMODIALYSIS CATHETER PLACEMENT - ULTRASOUND AND FLUOROSCOPIC-GUIDED DATE: 07/27/2019 10:22 AM ACCESSION: [id number] UN DICTATED: 07/27/2019 1:17 PM INTERPRETATION LOCATION: [facility] CLINICAL INDICATION: 49 years old Male: ESRD initiation of hemodialysis CONSENT: Informed consent was obtained from the patient including a discussion of the alternatives, benefits, and risks including but not limited to infection, bleeding, and/or need for additional procedure. ULTRASOUND EVALUATION: With the patient in the supine position, the right neck and upper chest were prepped and draped using all elements of maximal sterile barrier technique. The right internal jugular vein was evaluated by ultrasound and was compressible and patent. An ultrasound image was saved and sent to PACS. An appropriate skin entry site was identified using ultrasound and anesthetized with 1% lidocaine with epinephrine. HEMODIALYSIS CATHETER PLACEMENT: A small skin incision was made, through which, the right internal jugular vein was accessed using a 21-G needle under ultrasound guidance. A 0.018 inch guidewire was advanced into the vein under fluoroscopic guidance. The 0.018  inch wire was then used under fluoroscopy in order to determine the appropriate intravascular catheter length. The access needle was exchanged for a micropuncture catheter, which was then aspirated, flushed and capped. A site below the inferior margin of the clavicle was identified as the tunnel exit site and was anesthetized with local anesthesia. Using a #11 blade, a small incision was made and the 27 cm length x 16-French Hemosplit catheter was tunneled through the chest incision to the lateral neck dermatotomy. Under fluoroscopy, the micropuncture catheter was exchanged over a 0.035 inch guidewire for a peel-away sheath. The catheter was advanced through the peel-away sheath. A fluoroscopic image was obtained which confirmed the tip of the catheter in the proximal right atrium. Each lumen of the catheter was aspirated and flushed freely. It was then instilled with appropriate volume of heparin 1000 units/mL. The neck dermatotomy was closed and the catheter was sutured to the skin with 2-0 Ethilon and dressed in a sterile manner. SEDATION: I personally spent 33 minutes, continuously monitoring the patient face-to-face during the administration of moderate sedation. Radiology nurse was present for the duration of the procedure to assist in patient monitoring.  Pre and Post Sedation activities have been reviewed. FLUOROSCOPIC TIME: 1.7 minutes CUMULATIVE DOSE: 46.7 mGy Successful placement of a 16-F, 27 cm, tunneled Hemosplit hemodialysis catheter in the right internal jugular vein under ultrasound and fluoroscopy. Rosalita Levan Elizebeth Brooking., MD was present for and supervised the entire procedure.    ICD / Pacemaker Evaluation    Result Date: 07/26/2019  Lawana Pai, RN     07/26/2019 11:09 AM ICD Interrogation RE: routine Manufacturer: Designer, jewellery: Vigilant EL ICD D233 Type of Device: Oceanographer Battery: 12.5 yearsestimated longevity Mode: DDD Lower rate limit 70  bpm Tachy Settings: Presenting Rhythm: AS/VS Underlying rhythm: SR Dependent:  No Pacing Percentages AP: 13 RVP: 1 Impedence (ohms) Atrial lead 636 Right Ventricular lead 291 HV/SVC 46  Sensing (mV) (P)Atrial lead 3.7 (R)Ventricular lead 14.1 Capture threshold (V @ ms) Atrial lead 0.9 V @ 0.40 ms Right Ventricular lead 0.9 V @ 0.40 ms  Diagnostics/Episodes ?? (9) ATR ?? (9) NSVT ?? (20) VT-1; no therapies given. Fluid Monitoring ?? No Evidence of Fluid Accumulation  Reprogramming ?? None See PDF in media tab for full interrogation record Questions, please call the Ep Device Rn via Vocera or dial ext [phone] Ep Device clinic [phone]    ______________________________________________________________________  Discharge Instructions:           Other Instructions     Discharge instructions      You were admitted to [facility] for shortness of breath and swelling. You were started on dialysis and did a great job with it. You will continue dialysis. We have made some changes to your home medicines listed below. You will have follow up with your primary care doctor shortly. Please make sure to ask about the left foot brace at that appointment. We also will have the home health nurses come out to see you. They will continue to take your weights and monitor your symptoms, depending on how you feel and your weights they [place] give you diuretics.     MEDICATION CHANGES:  - Stop Bumex, this pill makes you pill. You will have home health come out to see and weigh you. If you do notice increasing weight gain or swelling despite dialysis then you [place] need to take an extra dose of diuretics  - Stop Amlodipine (blood pressure medicine)  -  Decrease coreg to 25 mg twice a day (blood pressure medicine)  - Decrease Imdur to 30 mg daily (blood pressure medicine)  - Decrease Lantus to 35 units a day (long acting insulin medicine)  - Decrease Lispro to 15 units a day (short acting insulin medicine)  - Stop Xarelto-- your blood thinning medicine. You will instead be on Apixaban which is a better blood thinner for you with your kidneys.     Specific Instructions for Bumex:  - Please obtain a scale and weigh yourself tomorrow morning after urinating  - Write down this weight (this is your dry weight)  - Then weigh yourself every morning after urinating and write down your weight  - If you gain two pounds from the day prior take Bumex 2 mg  - If you gain 5 pounds in one week above your dry weight please take Bumex 2 mg until you are back to within 2 pounds above your dry weight    If you feel dizzy or lightheaded, please hold off on the Bumex and let your doctor know.         Please carefully read and follow these instructions below upon your discharge:    1) Please take your medications as prescribed and note the changes listed on your discharge. At future follow-up appointments, please be sure to take all of your medications with you so your provider can better guide your care.     2) Seek medical care with your primary care doctor or local Emergency Room or Urgent Care if you develop any changes in your mental status, worsening abdominal pain, fevers greater than 101.5, any unexplained/unrelieved shortness of breath, uncontrolled nausea and vomiting that keeps you from remaining hydrated or taking your medication, or any other concerning symptoms.     3) Please go to your follow-up appointments. Some of your follow-up appointments have been listed below. If you do not see an appointment listed below with your primary care doctor, please call your doctor's office as soon as possible to schedule an appointment to be seen within 7-10 days of discharge.     4) If you have any concerns before you are able to follow-up with your primary care doctor, you can reach [place] by calling [phone] and asking to page the Med B resident on call.         You were admitted to [facility] for shortness of breath and swelling. You were started on dialysis and did a great job with it. You will continue dialysis. We have made some changes to your home medicines listed below. You will have follow up with your primary care doctor shortly. Please make sure to ask about the left foot brace at that appointment. We also will have the home health nurses come out to see you. They will continue to take your weights and monitor your symptoms, depending on how you feel and your weights they [place] give you diuretics.     MEDICATION CHANGES:  - Stop Bumex, this pill makes you pill. You will have home health come out to see and weigh you. If you do notice increasing weight gain or swelling despite dialysis then you Levengood need to take an extra dose of diuretics  - Stop Amlodipine (blood pressure medicine)  - Decrease coreg to 25 mg twice a day (blood pressure medicine)  - Decrease Imdur to 30 mg daily (blood pressure medicine)  - Decrease Lantus to 35 units a day (long acting insulin medicine)  -  Decrease Lispro to 15 units a day (short acting insulin medicine)  - Stop Xarelto-- your blood thinning medicine. You will instead be on Apixaban which is a better blood thinner for you with your kidneys.     Specific Instructions for Bumex:  - Please obtain a scale and weigh yourself tomorrow morning after urinating  - Write down this weight (this is your dry weight)  - Then weigh yourself every morning after urinating and write down your weight  - If you gain two pounds from the day prior take Bumex 2 mg  - If you gain 5 pounds in one week above your dry weight please take Bumex 2 mg until you are back to within 2 pounds above your dry weight    If you feel dizzy or lightheaded, please hold off on the Bumex and let your doctor know.         Please carefully read and follow these instructions below upon your discharge:    1) Please take your medications as prescribed and note the changes listed on your discharge. At future follow-up appointments, please be sure to take all of your medications with you so your provider can better guide your care.     2) Seek medical care with your primary care doctor or local Emergency Room or Urgent Care if you develop any changes in your mental status, worsening abdominal pain, fevers greater than 101.5, any unexplained/unrelieved shortness of breath, uncontrolled nausea and vomiting that keeps you from remaining hydrated or taking your medication, or any other concerning symptoms.     3) Please go to your follow-up appointments. Some of your follow-up appointments have been listed below. If you do not see an appointment listed below with your primary care doctor, please call your doctor's office as soon as possible to schedule an appointment to be seen within 7-10 days of discharge.     4) If you have any concerns before you are able to follow-up with your primary care doctor, you can reach [place] by calling [phone] and asking to page the Med B resident on call.          Resources and Referrals     Outpatient Dialysis Schedule    Your schedule will be Tuesday, Thursday and Saturday at 10:20.      For your first treatment, Tuesday, Candella 4, you will need to arrive at 9:30 to complete paperwork.    Please bring your ID, insurance cards and all medications.    [facility] [place]  [address] [place], [place] [address]  Phone: [phone]  Fax: [phone]    Home Health Services    You have been accepted for [facility] through [facility] [phone]).  They will see you with 48 hours of discharge.  Please call them at the number provided if you have any questions about this.             Follow Up instructions and Outpatient Referrals     Ambulatory referral to Home Health      Is this a [facility] or [facility] Patient?: Yes    Home Health Options: [facility]    Is this patient at high risk for COVID 19 transmission and recommended to stay at home during this pandemic?: Yes    Does your patient need 7-10 touchpoints in the first 7 days of care including virtual visit with a provider with 36 hours? (Must be [place]  [facility] Health Dept and must choose Virtual Provider to follow the patient for up to 60 days): Yes    If the patient has a diagnosis of heart failure and is already on oral diuretics, do you want to activate the in home IV Lasix protocol?: No    Is Potassium replacement needed? If yes, activate treatment protocol: No - not needed    Physician to follow patient's care: Virtual Provider only available through [facility]    Disciplines requested:  Nursing  Physical Therapy  Occupational Therapy       Nursing requested: Teaching/skilled observation and assessment    What teaching is needed (new diagnosis? new medications?): CHF teaching and monitoring; monitor volume status and cardiopulmonary status    Physical Therapy requested: Evaluate and treat    Occupational Therapy Requested: Evaluate and treat    Please admit to [facility] for a diagnosis of Heart Failure.    Home Health Physical Therapist for home exercise program, breathing exercises, edema control and home safety evaluation.     Home Health RN to instruct CHF disease management, medication teaching and reconciliation, diet and fluid restriction 2000 ml/day instruction.  Initiate CHF pathway and home tele-monitoring. Please evaluate for SW and RD needs.     Please weigh the patient on arrival, his discharge weight is 363 lbs. If his weight is increasing, with two lbs above his dry weight he can take Bumex 2 mg once. If he gains 5 lbs in one week above dry weight he can take Bumex 2 mg until he is within 2 lbs of his dry weight.    Consult MD if patient exhibits the following for two or more consecutive days: coughing, shortness of breath, worsening lower extremity edema, weight gain from his baseline.     Vanmeter use standard parameters for home telemonitoring      I certify that Jeremy Route Nusser is confined to his/her home and needs intermittent skilled nursing care, physical therapy and/or speech therapy or continues to need occupational therapy. The patient is under my care, and I have authorized services on this plan of care and will periodically review the plan. The patient had a face-to-face encounter with an allowed provider type on 4/30 and the encounter was related to the primary reason for home health care.    Discharge instructions      You were admitted to [facility] for shortness of breath and swelling. You were started on dialysis and did a great job with it. You will continue dialysis. We have made some changes to your home medicines listed below. You will have follow up with your primary care doctor shortly. Please make sure to ask about the left foot brace at that appointment. We also will have the home health nurses come out to see you. They will continue to take your weights and monitor your symptoms, depending on how you feel and your weights they [place] give you diuretics.     MEDICATION CHANGES:  - Stop Bumex, this pill makes you pill. You will have home health come out to see and weigh you. If you do notice increasing weight gain or swelling despite dialysis then you Schrader need to take an extra dose of diuretics  - Stop Amlodipine (blood pressure medicine)  - Decrease coreg to 25 mg twice a day (blood pressure medicine)  - Decrease Imdur to 30 mg daily (blood pressure medicine)  - Decrease Lantus to 35 units a day (long acting  insulin medicine)  - Decrease Lispro to 15 units a day (short acting insulin medicine)  - Stop Xarelto-- your blood thinning medicine. You will instead be on Apixaban which is a better blood thinner for you with your kidneys.     Specific Instructions for Bumex:  - Please obtain a scale and weigh yourself tomorrow morning after urinating  - Write down this weight (this is your dry weight)  - Then weigh yourself every morning after urinating and write down your weight  - If you gain two pounds from the day prior take Bumex 2 mg  - If you gain 5 pounds in one week above your dry weight please take Bumex 2 mg until you are back to within 2 pounds above your dry weight    If you feel dizzy or lightheaded, please hold off on the Bumex and let your doctor know.         Please carefully read and follow these instructions below upon your discharge:    1) Please take your medications as prescribed and note the changes listed on your discharge. At future follow-up appointments, please be sure to take all of your medications with you so your provider can better guide your care.     2) Seek medical care with your primary care doctor or local Emergency Room or Urgent Care if you develop any changes in your mental status, worsening abdominal pain, fevers greater than 101.5, any unexplained/unrelieved shortness of breath, uncontrolled nausea and vomiting that keeps you from remaining hydrated or taking your medication, or any other concerning symptoms.     3) Please go to your follow-up appointments. Some of your follow-up appointments have been listed below. If you do not see an appointment listed below with your primary care doctor, please call your doctor's office as soon as possible to schedule an appointment to be seen within 7-10 days of discharge.     4) If you have any concerns before you are able to follow-up with your primary care doctor, you can reach [place] by calling [phone] and asking to page the Med B resident on call.          Appointments which have been scheduled for you    Massie 02, 2021 To Be Determined  SN - OASIS START OF CARE with Carlene Coria, RN  [facility] AND HOSPICE [facility]) [address]  [address]  [place] [address]  [phone]      Seat 04, 2021 12:00 PM  (Arrive by 11:45 AM)  RETURN VIDEO - OTHER with Freida Busman, MD  [facility] NEUROLOGY CLINIC [address] [place] [facility] REGION) [address]  [place] [address]  [phone]      Fa[place] 05, 2021  8:20 AM  (Arrive by 8:05 AM)  RETURN CONTINUITY with Roma Schanz, MD  [facility] INTERNAL MEDICINE [place] [facility] REGION) [address]  [place] [address]  [phone]      [place] 05, 2021  9:00 AM  (Arrive by 8:45 AM)  RETURN  GENERAL with Mila Merry, MD  [facility] KIDNEY SPECIALTY AND TRANSPLANT CLINIC [place]  [facility] REGION) [address]  [place] [address]  [phone]      Jan 16, 2020  8:30 AM  (Arrive by 8:15 AM)  RETURN  RHEUMATOLOGY with Monica Martinez, MD  [facility] RHEUMATOLOGY SPECIALTY [place] [facility] REGION) [address]  [place] [address]  [phone]           ______________________________________________________________________  Discharge Day Services:  BP 146/77  - Pulse 73  - Temp 35.6 ??C (Oral)  - Resp 18  - Ht 188 cm (6' 2)  - Wt (!) 164.8 kg (363 lb 5.1 oz)  - SpO2 97%  - BMI 46.65 kg/m??   Pt seen on the day of discharge and determined appropriate for discharge.    Condition at Discharge: good    Length of Discharge: I spent greater than 30 mins in the discharge of this patient.

## 2019-08-01 DIAGNOSIS — Z09 Encounter for follow-up examination after completed treatment for conditions other than malignant neoplasm: Principal | ICD-10-CM

## 2019-08-01 MED ORDER — ACETAMINOPHEN 500 MG TABLET
ORAL_TABLET | 0 refills | 0 days
Start: 2019-08-01 — End: 2020-07-31

## 2019-08-01 NOTE — Unmapped (Unsigned)
Patient is not available for virtual clinic appointment.  His wife stated that she cannot do appointments on Tuesdays and prefers appointments with provider on Mondays, Wednesdays or Fridays.  I will reach out to the front desk and have them reschedule this with another provider.

## 2019-08-02 ENCOUNTER — Institutional Professional Consult (permissible substitution): Admit: 2019-08-02 | Discharge: 2019-08-02 | Payer: MEDICARE

## 2019-08-02 ENCOUNTER — Ambulatory Visit
Admit: 2019-08-02 | Discharge: 2019-08-02 | Payer: MEDICARE | Attending: Pediatric Pulmonology | Primary: Pediatric Pulmonology

## 2019-08-02 DIAGNOSIS — I5042 Chronic combined systolic (congestive) and diastolic (congestive) heart failure: Principal | ICD-10-CM

## 2019-08-02 DIAGNOSIS — E1143 Type 2 diabetes mellitus with diabetic autonomic (poly)neuropathy: Principal | ICD-10-CM

## 2019-08-02 DIAGNOSIS — N186 End stage renal disease: Principal | ICD-10-CM

## 2019-08-02 DIAGNOSIS — Z992 Dependence on renal dialysis: Principal | ICD-10-CM

## 2019-08-02 DIAGNOSIS — E875 Hyperkalemia: Principal | ICD-10-CM

## 2019-08-02 DIAGNOSIS — I10 Essential (primary) hypertension: Principal | ICD-10-CM

## 2019-08-02 DIAGNOSIS — Z4502 Encounter for adjustment and management of automatic implantable cardiac defibrillator: Principal | ICD-10-CM

## 2019-08-02 MED ORDER — LEVEMIR FLEXTOUCH U-100 INSULIN 100 UNIT/ML (3 ML) SUBCUTANEOUS PEN
0 refills | 0 days | Status: CP
Start: 2019-08-02 — End: ?

## 2019-08-02 NOTE — Unmapped (Signed)
Missed IM Counseling Visit     Attempted to reach Jeremy Oliver for his counseling appointment.  He engaged briefly for planned video visit, but was waiting in the hospital to start diuresis.  Difficulty with technology was unable to hold the visit.  My Chart message sent in follow up to Jeremy Oliver

## 2019-08-02 NOTE — Unmapped (Signed)
Rescheduling for tomorrow due to dialysis

## 2019-08-02 NOTE — Unmapped (Signed)
Cardiac Implantable Electronic Device Remote Monitoring     Visit Date:  08/02/2019    Findings: Normal device function, Tested Lead measurements stable and within normal range, Adequate battery reserve-11.5 years     Arrhythmias: <1 % AF burden currently in AF for the past 24 hours currently on Eliquis. Multiple NS-VT and VT-1 episodes EGM's appear to be runs of AF with RVR.     Plan: Continue Routine Remote Monitoring     Last seen in person: 10/16/2018    Manufacturer of Device: [employer]       Type of Device: Oceanographer  See scanned/downloaded PDF report for model numbers, serial numbers, and date(s) of implant.    Presenting Rhythm: AS/VS AF     ______________________________________________________________________    Heart Failure Monitoring: No Evidence of Fluid Accumulation  Hx of HF.       Please see downloaded PDF file of transmission under Media Tab  for full details of device interrogation to include, when applicable,  battery status/charge time, lead trend data, and programmed parameters.

## 2019-08-03 ENCOUNTER — Ambulatory Visit: Admit: 2019-08-03 | Discharge: 2019-08-04 | Payer: MEDICARE

## 2019-08-03 DIAGNOSIS — I10 Essential (primary) hypertension: Principal | ICD-10-CM

## 2019-08-03 DIAGNOSIS — E1122 Type 2 diabetes mellitus with diabetic chronic kidney disease: Principal | ICD-10-CM

## 2019-08-03 DIAGNOSIS — E875 Hyperkalemia: Principal | ICD-10-CM

## 2019-08-03 DIAGNOSIS — N186 End stage renal disease: Secondary | ICD-10-CM

## 2019-08-03 DIAGNOSIS — E1143 Type 2 diabetes mellitus with diabetic autonomic (poly)neuropathy: Principal | ICD-10-CM

## 2019-08-03 DIAGNOSIS — Z794 Long term (current) use of insulin: Secondary | ICD-10-CM

## 2019-08-03 DIAGNOSIS — Z992 Dependence on renal dialysis: Principal | ICD-10-CM

## 2019-08-03 DIAGNOSIS — E119 Type 2 diabetes mellitus without complications: Secondary | ICD-10-CM

## 2019-08-03 DIAGNOSIS — I5042 Chronic combined systolic (congestive) and diastolic (congestive) heart failure: Principal | ICD-10-CM

## 2019-08-03 DIAGNOSIS — N184 Chronic kidney disease, stage 4 (severe): Secondary | ICD-10-CM

## 2019-08-03 MED ORDER — AMLODIPINE 10 MG TABLET: 10 mg | Freq: Every day | 0 refills | 0 days

## 2019-08-03 NOTE — Unmapped (Signed)
Previsit complete    Time spent on phone: 5 mins    Reason for visit: follow-up    Questions / Concerns that need to be addressed:    General Consent to Treat (GCT) for non-epic video visits only: Verbal consent    Vitals signs, if any:         Allergies reviewed: Yes    Medication reviewed: Yes    Refills needed, if any:     Travel Screening: completed      Hark (DV) Screening: completed    [name] reviewed and updated in Epic:  Completed      HARK Screening  HARK Screening  Within the last year, have you been humiliated or emotionally abused in other ways by your partner or ex-partner?: No  Within the last year, have you been afraid of your partner or ex-partner?: No  Within the last year, have you been raped or forced to have any kind of sexual activity by your partner or ex-partner?: No  Within the last year, have you been kicked, hit, slapped, or otherwise physically hurt by your partner or ex-partner?: No

## 2019-08-03 NOTE — Unmapped (Signed)
I have reviewed the battery, threshold, lead impedance, and overall device/lead status, diagnostic data including arrhythmic events and therapies on the remote device evaluation and agree with the documentation.    Hadlei Stitt, MD

## 2019-08-03 NOTE — Unmapped (Signed)
[facility] Internal Medicine Clinic  Established Patient - Phone Visit    This visit is conducted via telephone.    Contact Information  Person Contacted: Patient  Contact Phone number: [phone] (home)   Is there someone else in the room? Yes. What is your relationship? wife. Do you want this person here for the visit? yes.  Patient agreed to a phone visit    [name] is a 49 y.o. male  participating in a telephone encounter.    Reason for Call  Diabetes, new start dialysis    Subjective:  Nurse couldn't come out today since he wanted to keep visit with me today. Aware of nurse visit on Friday, but not clear that it would also be with our hospital follow-up team via video    Received HD yesterday.  [facility] [place]. Went OK. Had some nausea and felt very tired afterward.  He had worried that something was wrong. Wife checked BP and it was SBP 120s.  Later that evening BP 145/80.      Requesting refills of Levemir, however I discuss with wife ongoing need for monitoring of BG   Wife reports he is taking levemir 35 units, Novolog 15 units. Able to tell me carvedilol back to 1 tablet BID, continuing amlodipine 10mg  daily, Renvela 2 tablets 3 times a day, Imdur once a day  Has stopped taking Bumex, hydralazine  Has restarted on Eliquis   Not aware of instructions to stop amlodipine    Just got glucometer, strips, lancets through [facility] but other meds getting at [facility]    I have reviewed the problem list, medications, and allergies and have updated/reconciled them if needed.    Objective:  As part of this Telephone Visit, no in-person exam was conducted.      Assessment & Plan:  1. Type 2 diabetes mellitus with stage 4 chronic kidney disease, with long-term current use of insulin (CMS-[name])    2. ESRD needing dialysis (CMS-[name])    3. Diabetes mellitus with coincident hypertension (CMS-[name])        1. Type 2 diabetes mellitus with stage 4 chronic kidney disease, with long-term current use of insulin (CMS-[name])  Lab Results   Component Value Date    A1C 7.2 (H) 04/14/2019   - continue on recently decreased regimen of Levemir 35 units daily, Novolog 15 units with meals  - unclear if he should stay on semaglutide, however he had started this previously leading too good control of his diabetes.  Does not appear to be dialyzable and seems that he can remain on this for now. Will consult with pharmacist  - instructed to check BGs ACHS and have these readings available for HFU/EHH appt on Friday      2. ESRD needing dialysis (CMS-[name])  Had first HD session on Tuesday.  Tolerating well with anticipated side effects of nausea and   At [facility] visit, will need assessment of volume status and determination of need to restart Bumex.   Asked wife to weigh him daily and report weights to HFU/EHH on Friday    3. Diabetes mellitus with coincident hypertension (CMS-[name])  HBPM yesterday above goal of < 130/90.   Amlodipine was discontinued, but he has continued to take.  OK to continue at this point and I added back to med list.    - continue carvedilol 25mg  BID  - Asked to check daily BPs and BP after HD.    - If SBP<  90 asked her to hold amlodipine.   - reassess anti-HTN regimen on Friday with HFU/EHH visit      Follow-up:  With HFU clinic/EHH on Friday 5/7 via video            I spent 18 minutes on the phone with the patient on the date of service. I spent an additional 5 minutes on pre- and post-visit activities on the date of service.     The patient was physically located in [place] or a state in which I am permitted to provide care. The patient and/or parent/guardian understood that s/he [place]liew incur co-pays and cost sharing, and agreed to the telemedicine visit. The visit was reasonable and appropriate under the circumstances given the patient's presentation at the time.    The patient and/or parent/guardian has been advised of the potential risks and limitations of this mode of treatment (including, but not limited to, the absence of in-person examination) and has agreed to be treated using telemedicine. The patient's/patient's family's questions regarding telemedicine have been answered.     If the visit was completed in an ambulatory setting, the patient and/or parent/guardian has also been advised to contact their provider???s office for worsening conditions, and seek emergency medical treatment and/or call 911 if the patient deems either necessary.

## 2019-08-04 NOTE — Unmapped (Signed)
Asked to reschedule:  The Hospital Follow-Up Enhanced Care Team spoke to Shawn Route [place] spouse on [place] 6, 2021  regarding rescheduling their appointment on  08/05/2019. Pt voiced that they do not wish to scheduled at time, that home health was coming to the home. Will reach out to their provider if they need help with anything.Please let me know if I can do anything to help you.  Thank you.

## 2019-08-04 NOTE — Unmapped (Signed)
REASON FOR CALL: Care Management Hospital Follow-up     Call placed to pt in hopes of completing social work hospital follow up assessment for scheduled HFU appt 08/05/19. LCSW spoke with pt's wife, Mrs. [name] Baris, who was with the pt in the room. They requested HFU appt for tomorrow be re-scheduled due to [facility] nurse coming to see pt tomorrow and the insurance won't cover if there is double billing on the same day. They were encouraged to be on the lookout for a call from [facility] Scheduling to help with getting a new appt.    LCSW is creating Telephonic Encounter and will route to scheduling team to follow up with pt and help re-schedule HFU appt for next week.    Jennell Corner, MSW, LCSW  [facility] Manager- [facility] Internal Medicine  Phone: [phone]  Pager: [phone]

## 2019-08-04 NOTE — Unmapped (Signed)
Unable to reach/voicemail left: 1st attempt   The Hospital Follow-Up Enhanced Care Team attempted to reach pt on [place] 6, 2021 in regards to rescheduling their appointment on 08/05/2019. Patient was unable to be reached and voicemail was left with request for call back. Caller to make another attempt to reach pt.

## 2019-08-05 ENCOUNTER — Telehealth: Admit: 2019-08-05 | Discharge: 2019-08-06 | Payer: MEDICARE | Attending: Internal Medicine | Primary: Internal Medicine

## 2019-08-05 DIAGNOSIS — I5042 Chronic combined systolic (congestive) and diastolic (congestive) heart failure: Principal | ICD-10-CM

## 2019-08-05 DIAGNOSIS — I10 Essential (primary) hypertension: Principal | ICD-10-CM

## 2019-08-05 DIAGNOSIS — N186 End stage renal disease: Principal | ICD-10-CM

## 2019-08-05 DIAGNOSIS — E875 Hyperkalemia: Principal | ICD-10-CM

## 2019-08-05 DIAGNOSIS — Z992 Dependence on renal dialysis: Principal | ICD-10-CM

## 2019-08-05 DIAGNOSIS — E1143 Type 2 diabetes mellitus with diabetic autonomic (poly)neuropathy: Principal | ICD-10-CM

## 2019-08-05 MED ORDER — CARVEDILOL 25 MG TABLET
ORAL_TABLET | Freq: Two times a day (BID) | ORAL | 0 refills | 30.00000 days | Status: CP
Start: 2019-08-05 — End: 2019-09-04
  Filled 2019-09-23: qty 60, 30d supply, fill #0

## 2019-08-05 MED ORDER — LEVEMIR FLEXTOUCH U-100 INSULIN 100 UNIT/ML (3 ML) SUBCUTANEOUS PEN
3 refills | 0 days | Status: CP
Start: 2019-08-05 — End: ?
  Filled 2019-08-08: qty 15, 33d supply, fill #0

## 2019-08-05 MED ORDER — ISOSORBIDE MONONITRATE ER 30 MG TABLET,EXTENDED RELEASE 24 HR
ORAL_TABLET | Freq: Every day | ORAL | 3 refills | 120 days | Status: CP
Start: 2019-08-05 — End: ?
  Filled 2019-08-31: qty 90, 90d supply, fill #0

## 2019-08-05 NOTE — Unmapped (Signed)
Dr. Hulan Fess, I received a phone call today from [place] with [facility] calling you back to let you know about Jeremy Oliver's  BP medication. Brayton Caves stated Dr. Erling Conte has discontinued his Amlodopine. He should no longer be taking the Amlodopine. She says he is now on COREG 25 mg 2 x day -HOLD AM DOSE on mornings he goes to dialysis but take pm dose everyday.  He is also on IMDUR 30 mg 1 x day. She said he is to HOLD this medication on the days he does dialysis if he takes this one in the morning. She was pretty sure he does.    Thank you   Vickie  08/05/19

## 2019-08-05 NOTE — Unmapped (Signed)
Internal Medicine Hospital Follow-Up video Visit    ASSESSMENT / PLAN:  Things to follow-up at next visit:   1. Hypotension at dialysis  2. Seizure-like episode  3. Reassess blood sugars with recent change on Levemir      1. Diabetes mellitus with gastroparesis (CMS-[name])    2. 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 Paroxysmal atrial fibrillation (CMS-[name])    4. ESRD on dialysis (CMS-[name])      1. Diabetes mellitus with gastroparesis (CMS-[name])  Blood pressures are elevated above goal.  Advised to increase his Levemir to 37 units at night.  Continue aspart 15 units 3 times daily with meals.  Recommend checking blood sugars 4 times a day, advised to bring meter to next face-to-face evaluation and also reviewe with home health RN if it does not save sugar readings.  Recommended writing down readings in the interim.  -Continue insulin aspart 15 units 3 times daily  -Continue Ozempic 0.5 units subcutaneous every 7 days???okay to do well on dialysis  - insulin detemir U-100 (LEVEMIR FLEXTOUCH U-100 INSULN) 100 unit/mL (3 mL) injection pen; Inject 37 units total under the skin nightly  Dispense: 15 mL; Refill: 3    2. 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  Appointment with cardiology rescheduled to Thursday next week.  Continue apixaban, ticagrelor Zetia, atorvastatin without changes.  Advised to hold Imdur and carvedilol on dialysis days    3. Paroxysmal atrial fibrillation (CMS-[name])  Anticoagulated on apixaban.  On amiodarone.  Tolerating without bleeding.  Continue current regimen without changes.    4. ESRD on dialysis (CMS-[name])  Having significant hypotension that is symptomatic during dialysis.  Additionally had an acute episode of hypotension and possible seizure-like event it after encounter today.  Pharmacist discussed with dialysis center to determine necessary medication changes.  -Continue current dose of sevelamer  -Discontinue amlodipine  -Hold carvedilol on dialysis days  -Hold Imdur on dialysis days    Call was interrupted???this was due to family and RN noting the patient did not look good while provider was talking to the RN. BP 70/40.  Recheck was ~70/40 range on manual check.  At that point I advised the RN to call EMS.    Received report from [facility] subsequently.  Patient was noted to be unresponsive and had a seizure-like episode.  By the time EMS had arrived he had recovered completely and was reportedly back to baseline.  At that time patient declined EMS transfer to the emergency room.    I subsequently called patient's wife to discuss further.  According to her he has not been diagnosed with seizure distorder. At EMS arrival was talking and back to baseline.  He noted that he is now fine.  She was concerned it Kracht be related to his heart and so she recheduled with Cardiology next week (Thursday). Had episode of nausea yesterday before dialysis.  We discussed that I do not feel comfortable sending a prescription for an antiemetic???ondansetron will prolong QT and he is on amiodarone.  Phenergan can lower the seizure threshold.  Additionally his nausea appears to be associated with these episodes of hypotension and unresponsiveness.  I strongly advised patient and his wife to present to the [facility] same-day clinic for a face-to-face evaluation to better determine the etiology of his  episodes.  I also encouraged ED evaluation as an alternative.  They declined to schedule at present and prefer to stay at home for now.  Were agreeable to PCP follow-up face-to-face in 1 week.    Medication adjustments:   1. Increase insulin levemir to 37 units at night  2. Stop ondansetron (zofran)  3. Stop amlodipine  4. HOLD carvedilol and imdur on dialysis days    Additional changes not made as patient became acutely ill and home health RN was advised to call EMS    Follow Up: Return in about 1 week (around 08/12/2019).     I spent 24 minutes on the real-time audio and video with the patient. I spent an additional 40 minutes on pre- and post-visit activities -including follow-up call with home health RN, additional phone calls to wife to check on patient, debrief with home health agency regarding seizure-like episode.    I personally spent 64 minutes face-to-face and non-face-to-face in the care of this patient, which includes all pre, intra, and post visit time on the date of service.    The patient was physically located in [place] or a state in which I am permitted to provide care. The patient and/or parent/gauardian understood that s/he [place] incur co-pays and cost sharing, and agreed to the telemedicine visit. The visit was completed via phone and/or video, which was appropriate and reasonable under the circumstances given the patient's presentation at the time.    The patient and/or parent/guardian has been advised of the potential risks and limitations of this mode of treatment (including, but not limited to, the absence of in-person examination) and has agreed to be treated using telemedicine. The patient's/patient's family's questions regarding telemedicine have been answered.     If the phone/video visit was completed in an ambulatory setting, the patient and/or parent/guardian has also been advised to contact their provider???s office for worsening conditions, and seek emergency medical treatment and/or call 911 if the patient deems either necessary.           Future Appointments   Date Time Provider Department Center   08/05/2019  3:30 PM Jodie Echevaria, Mer [facility]   08/08/2019  5:00 AM Park Meo, RN [facility]   08/08/2019  4:00 PM Graciella Freer, OT [facility]   08/10/2019  1:00 AM Park Meo, RN [facility]   08/11/2019  2:15 PM Marlaine Hind, MD [facility]   08/12/2019 1:00 AM Park Meo, RN [facility]   08/12/2019  8:20 AM [facility] FOLLOW-UP [facility]   08/15/2019 10:00 AM Diane R Dolan-Soto, LCSW [facility]   08/16/2019 To Be Determined Graciella Freer, OT [facility]   09/14/2019  3:45 PM Patrcia Dolly, PA NEUR [facility]   11/01/2019 10:30 AM [place] [facility]   01/16/2020  8:30 AM Monica Martinez, MD [facility]   01/31/2020 10:30 AM [place] [facility]       Medication adherence and barriers to the treatment plan have been addressed. Opportunities to optimize healthy behaviors have been discussed. Patient / caregiver voiced understanding.      This visit is conducted via IT consultant Information  Person Contacted: Patient  Contact Phone number: [phone] (home)   Is there someone else in the room? Yes. What is your relationship? wife, home health  RN. Do you want this person here for the visit? Yes.  Patient agreed to a video visit    Mr. Palomarez is a 48 y.o. male  participating in a video encounter.    CHIEF COMPLAINT: Hospital Follow-Up for   No chief complaint on file.    Date of Hospitalization Discharge:  07/30/19     Reviewed: Discharge summary, Labs and Procedures; See results in Epic  Discussed care with: PCP, Pharmacist and Home Health  Interactive Contact by phone or other method (Encounter type: Patient Outreach):  Patient Outreach History (Since 07/22/2019)     Care Management     Date Method of Outreach Associated Actions User Next Outreach    08/04/2019  8:49 AM Telephone  Ralph Dowdy, LCSW           Transition of Care     Date Method of Outreach Associated Actions User Next Outreach    08/01/2019  9:36 AM Telephone  Duwaine Maxin, RN                Successful contact made or 2 unsuccessful attempts within 2 business days  History obtained from: Family member and Patient    HISTORY OF PRESENT ILLNESS:  Summary of hospitalization: Jeremy Oliver??is a 49 y.o.??male??with PMHx of??CKD, CAD, AICD, DM, HTN, HLD, HFrEF (EF 35% w G1DD), TIA, A fib??on??xarelto??that presented??as a direct admission for new start dialysis in setting of refractory volume overload.    Outpatient Provider Follow Up Issues:   [ ]  patient inquired about a new left foot brace, unable to obtain prior to  His discharge  [ ]  follow up with his weights and volume status, was discharged without resuming his home Bumex since starting HD and having lower blood pressures. He will have home health (CHF pathway) and instructions to add back Bumex at a low dose (2 mg a day) if weight begins to increase  [ ]  repeat BMP and Mg   [ ]  follow up his blood pressure--> held amlodipine, decreased coreg and imdur given lower blood pressures during dialysis. [place] need adjustment as he becomes more used to dialysis   [ ]  Check BG. Requiring less insulin than prescribed at home, decreased meal time and long acting on discharge    Interval update:  Since hospital discharge, Jeremy Oliver saw his PCP earlier this week for a phone visit. Last dialysis session was yesterday, they did not remove fluid, just Cleaned blood and BP bottomed out. Today is here.Body is going in and out. No orthostatic dizziness. No chest pains. No breathing problems. No LE edema. Sleep is good. Uses Bipap at night, sleeps better with this. Sleeping on side with 2-3 pillows. Currently using 2 pillows.    Chronic left leg pain. About the same. Diagnosed with drop foot.     Checking BG - supposed to do it 4 times/day. Only able to check 2 times/day on dialysis days. Yesterday AM was 152, today AM was 163. Yesterday evening 178 (HS). No symptomatic hyper or hypoglycemia. On ozempic 0.5mg  dose weekly. Has been on this dose for months. Insulin aspart 15 units TID AC and insulin levemir 35 units at night. No missed doses.  Having diarrhea off an on at baseline. No bleeding issues with apixaban. Having nausea ever day, has not been able to find/take zofran.     Was advised to hold BP medications on dialysis days but does not rememer which ones. They were supposed to send a paper home but  did not give it to him.     When pharmacist attempted to do medication reconciliation, patient advised to discuss with his wife.  At that time, pharmacist noted that patient's wife got agitated and upset with her about needing to do the medication reconciliation again.  The pharmacist advised pausing the conversation to allow her time to calm down.  After a few moments, this provider returned to the call and explained the importance of medication reconciliation and the need to repeat it as the computer EMR is not always accurate.  Also confirm the patient's PCP had recommended doing a medication reconciliation after their visit earlier this week.  Call was ended by patient's wife.    Provider called home health RN back and reviewed some of the medications with the RN to confirm the need for refills and any potential discrepancies. Requesting refills for: hydroxyzine, ondansetron, loperamide and insulin.  Pharmacist called dialysis facility and confirmed the patient was advised to hold carvedilol and Imdur on dialysis days.  It is still unclear whether patient should still be taking amlodipine (was discontinued at discharge, but is still on the med list).  During this conversation, the home health RN noted the patient was feeling unwell and that automatic BP was low at 78/40 (automatic).  Requested a manual recheck noted to be 70/40.  Provider advised the home health RN who was present in the home to call EMS and request immediate transfer to emergency room.     MEDICATIONS AND ALLERGIES:   Reviewed and updated in EPIC    Medication and allergy review was completed by the pharmacist and discussed during this visit. Please see their note within this encounter.    SOCIAL/FAMILY HISTORY:  Social History:  Reviewed in Epic    REVIEW OF SYSTEMS:    All other systems reviewed are negative except as noted here or in HPI.    PHYSICAL EXAM  Vitals:    Vitals:    08/05/19 0810   BP: 130/68   Pulse: 86   Resp: 18   Temp: 36.1 ??C (97 ??F)   SpO2: 95%     Follow-up BP: 74/40 automatic cuff, 70/40 manual    Exam:    Initially well-appearing, no acute distress.  Speaking full sentences.  Limited by video visit.     Additional exam per [facility]: No swelling, breathing comfortably.     Subsequently connected with home health RN on telephone.  Unable to do assessment when patient was reported to be feeling unwell.      Labs:  Reviewed from discharge.  See Epic labs.  Radiology: Reviewed radiology studies from discharge. See in Epic.       MEDICAL/SURGICAL HISTORY:  Patient Active Problem List   Diagnosis   ??? 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 Idiopathic chronic gout of left knee   ??? Hypercholesteremia   ??? Diabetes mellitus with coincident hypertension (CMS-[name])   ??? 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 Osteoarthritis   ??? Chronic nonalcoholic liver disease   ??? GERD (gastroesophageal reflux disease)   ??? Mild intermittent asthma without complication   ??? Chronic pain syndrome   ??? Paroxysmal atrial fibrillation (CMS-[name])   ??? OSA (obstructive sleep apnea)   ??? Tobacco use disorder   ??? Status post cardiac pacemaker procedure   ??? Diabetes mellitus with gastroparesis (CMS-[name])   ???  Cardiac pacemaker in situ   ??? Recurrent major depressive disorder, in full remission (CMS-[name])   ??? Adjustment disorder, unspecified   ??? Hyperkalemia   ??? Acquired left foot drop   ??? Decreased sensation of lower extremity, left   ??? Chronic anticoagulation   ??? Type 2 diabetes mellitus with stage 4 chronic kidney disease, with long-term current use of insulin (CMS-[name])   ??? Situational stress   ??? Cough   ??? QT prolongation   ??? ESRD needing dialysis (CMS-[name])   ??? Palliative care by specialist

## 2019-08-05 NOTE — Unmapped (Signed)
Made changes on Med list to reflect these changes.     CC'ed Dr. Barnetta Chapel and Dr. Cameron Ali to be aware.

## 2019-08-05 NOTE — Unmapped (Signed)
Previsit complete    Time spent on phone: 7 min    Reason for visit: hosp f/u     Questions / Concerns that need to be addressed:    General Consent to Treat (GCT) for non-epic video visits only: Verbal consent    Vitals signs, if any:       Diabetes:  ??? Regularly checking blood sugars?: yes  o If yes, when? Complete log  Date Before Breakfast After Breakfast Before Lunch After Lunch Before Dinner After Dinner Before Bed    5/6  152          178      5/7  163                                                                                                         Hypertension:  ??? Have blood pressure cuff at home?: yes- arm cuff  ??? Regularly checking blood pressure?: yes  If yes, complete log  Date Time BP Pulse    5/6  6am  150/70  84    5/6  830am  123/65  79    5/7  730am  130/68                                               Weight:360lbs    Date   08/04/19       Allergies reviewed: Yes    Medication reviewed: No    Refills needed, if any:     Travel Screening: not completed    Falls Risk: completed    Hark (DV) Screening: not completed    [name] reviewed and updated in Epic:  Completed or Not completed (Reason yes)      HARK Screening       AUDIT       PHQ2  PHQ-2 Total Score : 2    PHQ9          P4 Suicidality Screener                GAD7       COPD Assessment

## 2019-08-05 NOTE — Unmapped (Signed)
I have reflected these changes on his med list.      CC: Dr. Barnetta Chapel and Dr. Cameron Ali to be aware.

## 2019-08-05 NOTE — Unmapped (Signed)
REASON FOR VISIT: Hospital Follow-up Medication Management    Jeremy Oliver is a 49 y.o. year old male with CAD, DM, afib, T2DM, anticoag w/ Eliquis, liver disease, chronic pain, and GERD who was recently hospitalized for ESRD now on HD, presents to the Internal Medicine clinic for hospital follow up via video with [facility] RN present.     Today, I was present during the majority of the visit with Dr. Barnetta Chapel. I attempted to reconcile medications with his wife who manages his medications. She became very agitated and began yelling. She stated that she had already reviewed his meds twice this week. She expressed that she felt we were accusing her of not knowing what she was doing despite managing his medications for 20 years. I attempted to reassure her and redirect without success. I offered to pause the medication reconciliation and asked Dr. Barnetta Chapel to resume her portion of the visit.    I called her dialysis center and was told that [name] was instructed not to take Imdur or carvedilol the mornings of dialysis. The dialysis center did not have instructions on what to do with amlodipine, but stated that his nephrologist, Dr. Erling Conte, would be asked about whether he should take amlodipine prior to HD. I requested the dialysis center to contact us with any future med changes (attn: Dr. Hulan Fess).  ___________________________________________________   Jeanella Flattery, PharmD, CPP

## 2019-08-08 DIAGNOSIS — E1143 Type 2 diabetes mellitus with diabetic autonomic (poly)neuropathy: Principal | ICD-10-CM

## 2019-08-08 DIAGNOSIS — E875 Hyperkalemia: Principal | ICD-10-CM

## 2019-08-08 DIAGNOSIS — I5042 Chronic combined systolic (congestive) and diastolic (congestive) heart failure: Principal | ICD-10-CM

## 2019-08-08 DIAGNOSIS — Z992 Dependence on renal dialysis: Principal | ICD-10-CM

## 2019-08-08 DIAGNOSIS — I10 Essential (primary) hypertension: Principal | ICD-10-CM

## 2019-08-08 DIAGNOSIS — N186 End stage renal disease: Principal | ICD-10-CM

## 2019-08-08 MED ORDER — ALBUTEROL SULFATE HFA 90 MCG/ACTUATION AEROSOL INHALER
RESPIRATORY_TRACT | 2 refills | 0 days
Start: 2019-08-08 — End: ?

## 2019-08-08 MED ORDER — LOPERAMIDE 2 MG CAPSULE
ORAL_CAPSULE | Freq: Four times a day (QID) | ORAL | 0 refills | 8 days | PRN
Start: 2019-08-08 — End: ?

## 2019-08-08 MED ORDER — HYDROXYZINE HCL 10 MG TABLET
ORAL_TABLET | Freq: Every evening | ORAL | 0 refills | 30 days | PRN
Start: 2019-08-08 — End: 2019-09-07

## 2019-08-08 MED FILL — NOVOLOG FLEXPEN U-100 INSULIN ASPART 100 UNIT/ML (3 ML) SUBCUTANEOUS: 33 days supply | Qty: 15 | Fill #0 | Status: AC

## 2019-08-08 MED FILL — ACETAMINOPHEN 500 MG TABLET: 30 days supply | Qty: 180 | Fill #0 | Status: AC

## 2019-08-08 MED FILL — ACETAMINOPHEN 500 MG TABLET: 30 days supply | Qty: 180 | Fill #0

## 2019-08-09 NOTE — Unmapped (Signed)
[facility] Refill Coordination Note    Specialty Medication(s) to be Shipped:   General Specialty: Praluent    Other medication(s) to be shipped: N/A     Jeremy Oliver, DOB: [birth year]  Phone: [phone] (home)       All above HIPAA information was verified with patient's family member, Wife.     Was a Nurse, learning disability used for this call? No    Completed refill call assessment today to schedule patient's medication shipment from the [facility] [phone]).       Specialty medication(s) and dose(s) confirmed: Regimen is correct and unchanged.   Changes to medications: Allopuriol is now BID, Levrmir is now 37 units, Novolog is now 15 units, Carvedilol 1qd   Changes to insurance: No  Questions for the pharmacist: No    Confirmed patient received Welcome Packet with first shipment. The patient will receive a drug information handout for each medication shipped and additional FDA Medication Guides as required.       DISEASE/MEDICATION-SPECIFIC INFORMATION        For patients on injectable medications: Patient currently has 1 doses left.  Next injection is scheduled for 08/13/19.    SPECIALTY MEDICATION ADHERENCE     Medication Adherence    Patient reported X missed doses in the last month: 0  Specialty Medication: Praluent  Patient is on additional specialty medications: No                Praluent 150 mg/ml: 4 days of medicine on hand         SHIPPING     Shipping address confirmed in Epic.     Delivery Scheduled: Yes, Expected medication delivery date: 08/23/19.     Medication will be delivered via [facility] clinic to the temporary address in Epic WAM.    [name]   [facility] Specialty Technician

## 2019-08-10 DIAGNOSIS — N186 End stage renal disease: Principal | ICD-10-CM

## 2019-08-10 DIAGNOSIS — E875 Hyperkalemia: Principal | ICD-10-CM

## 2019-08-10 DIAGNOSIS — I5042 Chronic combined systolic (congestive) and diastolic (congestive) heart failure: Principal | ICD-10-CM

## 2019-08-10 DIAGNOSIS — E1143 Type 2 diabetes mellitus with diabetic autonomic (poly)neuropathy: Principal | ICD-10-CM

## 2019-08-10 DIAGNOSIS — Z992 Dependence on renal dialysis: Principal | ICD-10-CM

## 2019-08-10 DIAGNOSIS — I10 Essential (primary) hypertension: Principal | ICD-10-CM

## 2019-08-11 ENCOUNTER — Ambulatory Visit
Admit: 2019-08-11 | Discharge: 2019-08-12 | Payer: MEDICARE | Attending: Cardiovascular Disease | Primary: Cardiovascular Disease

## 2019-08-11 DIAGNOSIS — Z4502 Encounter for adjustment and management of automatic implantable cardiac defibrillator: Principal | ICD-10-CM

## 2019-08-11 MED ORDER — NITROGLYCERIN 0.4 MG SUBLINGUAL TABLET
SUBLINGUAL | 3 refills | 1.00000 days | Status: CP | PRN
Start: 2019-08-11 — End: 2020-08-10
  Filled 2019-08-18: qty 100, 30d supply, fill #0

## 2019-08-11 MED ORDER — LOPERAMIDE 2 MG CAPSULE
ORAL_CAPSULE | Freq: Four times a day (QID) | ORAL | 3 refills | 8.00000 days | Status: CP | PRN
Start: 2019-08-11 — End: ?
  Filled 2019-08-12: qty 30, 8d supply, fill #0

## 2019-08-11 NOTE — Unmapped (Signed)
We are going to schedule a heart catheterization

## 2019-08-11 NOTE — Unmapped (Signed)
PCP:  Kurtis Bushman, MD    Patient ID: Jeremy Oliver is a 49 y.o. male.    Assessment and Plan:   1. Coronary artery disease involving native coronary artery of native heart without angina pectoris  2. Chronic heart failure with reduced ejection fraction and diastolic dysfunction (CMS-[name])  3. Paroxysmal atrial fibrillation (CMS-[name])  4. Essential hypertension (RAF-[name])  5. CKD 5    Jeremy Oliver is doing better today after recent initiation of hemodialysis.  He does have generally severe fatigue and continues to have exertional angina with mild to moderate activity.   He remains very sedentary but has a history of severe multivessel coronary disease and multiple PCI.  We have discussed multiple ways forward including continued observation with risk factor modification versus proceeding directly to an invasive angiogram.  Because of his life limiting chest pain symptoms he would like to pursue additional angiogram and possible PCI and would going to arrange that within the next 2 weeks.  We will continue his normal dialysis regimen.     LDL 137 despite high dose statin.  He has been prescribed Praluent and we will plan to repeat his lipid panel when he comes for his heart catheterization next week.  Strict him to go the emergency department for any acute or worsening symptoms.  He is otherwise on excellent medical therapy provided by Dr. Cherly Hensen and Dr. Hulan Fess we made no changes to his regimen today.  Blood pressure is reasonably well controlled.    He had a syncopal episode last week but he did not receive an ICD shock.  He also has a history of pseudoseizures and will continue to monitor these symptoms for now.    Dictated using Animal nutritionist, please excuse typos    Elpidio Anis  MD [name]  Division of Cardiology   [place] [facility] [phone]    Subjective:   Jeremy Oliver presents today for follow-up with his wife.  He was recently mated to the hospital on the nephrology service for placement of dialysis access and initiation of intermittent hemodialysis.  He has now been going to the dialysis center in [facility] and his symptoms of shortness of breath have improved.  He is also lost a significant amount of fluid weight and is feeling less short of breath at rest.  However, he reports feeling progressively worsening shortness of breath and chest pain on exertion. He had dialysis today and he is feeling significantly fatigued today. On Friday he had a syncopal episode,  and called 911, but he recovered in 3-5 minutes and did not go to the hospital.     Wt Readings from Last 12 Encounters:   08/11/19 (!) 161 kg (355 lb)   08/05/19 (!) 163.3 kg (360 lb)   08/03/19 (!) 163.3 kg (360 lb)   07/30/19 (!) 164.8 kg (363 lb 5.1 oz)   07/25/19 (!) 164.4 kg (362 lb 6.4 oz)   07/22/19 (!) 166 kg (366 lb)   07/20/19 (!) 169.6 kg (373 lb 12.8 oz)   07/19/19 (!) 171 kg (377 lb)   07/15/19 (!) 173.7 kg (383 lb)   07/14/19 (!) 171.7 kg (378 lb 9.6 oz)   07/14/19 (!) 172.4 kg (380 lb)   07/08/19 (!) 172.6 kg (380 lb 8 oz)         Patient Active Problem List   Diagnosis   ??? Coronary artery disease  of native artery of native heart with stable angina pectoris (CMS-[name])   ??? Idiopathic chronic gout of left knee   ??? Hypercholesteremia   ??? Diabetes mellitus with coincident hypertension (CMS-[name])   ??? 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 Osteoarthritis   ??? Chronic nonalcoholic liver disease   ??? GERD (gastroesophageal reflux disease)   ??? Mild intermittent asthma without complication   ??? Chronic pain syndrome   ??? Paroxysmal atrial fibrillation (CMS-[name])   ??? OSA (obstructive sleep apnea)   ??? Tobacco use disorder   ??? Status post cardiac pacemaker procedure   ??? Diabetes mellitus with gastroparesis (CMS-[name])   ??? Cardiac pacemaker in situ   ??? Recurrent major depressive disorder, in full remission (CMS-[name]) ??? Adjustment disorder, unspecified   ??? Hyperkalemia   ??? Acquired left foot drop   ??? Decreased sensation of lower extremity, left   ??? Chronic anticoagulation   ??? Type 2 diabetes mellitus with stage 4 chronic kidney disease, with long-term current use of insulin (CMS-[name])   ??? Situational stress   ??? Cough   ??? QT prolongation   ??? ESRD needing dialysis (CMS-[name])   ??? Palliative care by specialist     Most Recent Imaging:  LHC  10/22/18  1.   Significant 90% stenosis of distal RCA, severe proximal LCx stenosis, and diffuse severe distal LAD stenosis  2.   Elevated LVEDP = 22   3.   Successful DES to the distal RCA with the placement of a Synergy 4.0 x 20 with excellent angiographic result and TIMI 3 flow.    05/12/18  1. Coronary artery disease including diffuse moderate to severe lesions in the left coronary system, with particular emphasis on the 90% proximal to mid LAD in-stent restenosis.  2. Successful PCI to the proximal to mid LAD with the placement of a Synergy drug eluting stent with excellent angiographic result and TIMI 3 flow.  3. Subsequent impaired flow at an adjacent moderate diagonal, which was small and had pre-existing ostial disease. Recrossed and treated with balloon angioplasty with restored flow.  4. Recent stent to RPDA/RPLA bifurcation widely patent.  5. Normal left ventricular filling pressures (LVEDP = 8 mm Hg).    03/12/18  1. Coronary artery disease including??severe mid RCA, and distal RCA bifurcation stenosis.  2. Left heart cath deferred  3. Successful??PCI??to the mid RCA and RPLA/RPDA bifurcation??with the placement of a Synergy DES x??with excellent angiographic result and TIMI 3 flow.    Echo 10/19/18  ?? Dilated left ventricle - mild  ?? Moderately decreased left ventricular systolic function, ejection fraction 35%  ?? Segmental wall motion abnormality - severe posterolateral hypokinesis  ?? Mitral regurgitation - mild  ?? Dilated left atrium - moderate  ?? Aortic sclerosis  ?? Normal right ventricular systolic function  ?? Technically difficult study due to chest wall/lung interference  ?? Ultrasound enhancing agent utilized to improve endocardial border definition  ?? Diastolic dysfunction - grade I (normal filling pressures)    ECG 07/28/19  SINUS RHYTHM WITH 1ST DEGREE AV BLOCK  T WAVE ABNORMALITY, CONSIDER LATERAL ISCHEMIA    Current Outpatient Medications   Medication Sig Dispense Refill   ??? acetaminophen (TYLENOL) 500 MG tablet TAKE 2 TABLETS (1 000MG ) BY MOUTH EVERY 8 HOURS AS NEEDED FOR PAIN 180 tablet 0   ??? albuterol HFA 90 mcg/actuation inhaler Inhale 2 puffs into the lungs every 6 (six) hours  as needed for wheezing or shortness of breath. 18 g 2   ??? alirocumab (PRALUENT) 150 mg/mL subcutaneous injection Inject the contents of 1 pen (150 mg total) under the skin every fourteen (14) days. 6 mL 3   ??? allopurinoL (ZYLOPRIM) 100 MG tablet Take 2 tablets (200 mg total) by mouth daily. 60 tablet 5   ??? amiodarone (PACERONE) 200 MG tablet TAKE 1 TABLET BY MOUTH ONCE DAILY 90 tablet 3   ??? apixaban (ELIQUIS) 5 mg Tab Take 1 tablet (5 mg total) by mouth Two (2) times a day. 60 tablet 11   ??? atorvastatin (LIPITOR) 80 MG tablet Take 1 tablet (80 mg total) by mouth daily. 90 tablet 3   ??? blood sugar diagnostic Strp Disp to match current ins approved meter. Use to test blood sugar four times daily. E11.22, Z79.4 400 each 3   ??? blood-glucose meter kit Disp. blood glucose meter kit preferred by patient's insurance. Dx: Diabetes, E11.9 1 each 11   ??? carvediloL (COREG) 25 MG tablet Take 1 tablet (25 mg total) by mouth Two (2) times a day. HOLD morning dose on dialysis days 60 tablet 0   ??? cholecalciferol, vitamin D3 25 mcg, 1,000 units,, 1,000 unit (25 mcg) tablet Take 1 tablet (25 mcg total) by mouth daily. 100 tablet 11   ??? diclofenac sodium (VOLTAREN) 1 % gel Apply 2 grams topically Three (3) times a day. 180 g 11   ??? ezetimibe (ZETIA) 10 mg tablet TAKE 1 TABLET BY MOUTH ONCE DAILY 90 each 3   ??? FLUoxetine (PROZAC) 40 MG capsule Take 2 capsules (80 mg total) by mouth daily. 180 capsule 3   ??? gabapentin (NEURONTIN) 300 MG capsule Take 1 capsule (300 mg total) by mouth two (2) times a day. 180 capsule 3   ??? insulin ASPART (NOVOLOG FLEXPEN U-100 INSULIN) 100 unit/mL (3 mL) injection pen Inject 15 units under skin three times a day with meals 15 mL 0   ??? insulin detemir U-100 (LEVEMIR FLEXTOUCH U-100 INSULN) 100 unit/mL (3 mL) injection pen Inject 37 units total under the skin nightly 15 mL 3   ??? insulin syringe-needle U-100 0.3 mL 31 gauge x 5/16 Syrg Use to inject insulin twice daily with meals 60 each 0   ??? isosorbide mononitrate (IMDUR) 30 MG 24 hr tablet Take 1 tablet (30 mg total) by mouth daily. DO NOT TAKE on dialysis days 120 tablet 3   ??? lancets Misc Disp. Ins preferred lancets. Use to test blood sugar four times daily. E11.22, Z79.4 400 each 3   ??? miscellaneous medical supply (BLOOD PRESSURE CUFF) Misc Measure BP once a day or if any symptoms 1 each 0   ??? nicotine polacrilex (NICORETTE) 4 MG gum Apply 1 each (4 mg total) to cheek every hour as needed for smoking cessation. 110 each 0   ??? nitroglycerin (NITROSTAT) 0.4 MG SL tablet Place 1 tablet (0.4 mg total) under the tongue every five (5) minutes as needed for chest pain. 100 each 3   ??? omeprazole (PRILOSEC) 40 MG capsule Take 1 capsule (40 mg total) by mouth Two (2) times a day (30 minutes before a meal). 180 capsule 3   ??? ondansetron (ZOFRAN) 4 MG tablet Take 1 tablet (4 mg total) by mouth every eight (8) hours as needed for nausea for up to 4 days. 10 tablet 0   ??? semaglutide 0.25 mg or 0.5 mg(2 mg/1.5 mL) PnIj Inject 0.5 mg under the skin every seven (  7) days. 4.5 mL 3   ??? sevelamer (RENVELA) 800 mg tablet TAKE 2 TABLETS (1600MG ) BY MOUTH THREE TIMES DAILY WITH MEALS 540 tablet 3   ??? ticagrelor (BRILINTA) 90 mg Tab TAKE 1 TABLET BY MOUTH TWICE DAILY 180 tablet 3   ??? UNIFINE PENTIPS 31 gauge x 5/16 (8 mm) Ndle USE WITH INSULIN AND VICTOZA 5 TIMES DAILY 400 each 3   ??? hydrOXYzine (ATARAX) 10 MG tablet Take 1 tablet (10 mg total) by mouth nightly as needed for anxiety. 30 tablet 0   ??? loperamide (IMODIUM) 2 mg capsule Take 1 capsule (2 mg total) by mouth 4 (four) times a day as needed for diarrhea. (Patient not taking: Reported on 08/11/2019) 30 capsule 0     No current facility-administered medications for this visit.       Allergies   Allergen Reactions   ??? Losartan      Hyperkalemia (K>6)   ??? Opioids - Morphine Analogues Anxiety       Review of Systems  Pertinent items are noted in HPI.      Objective:      Vitals:    08/11/19 1421   BP: 140/80   Pulse: 94   Resp: 18   SpO2: 93%   Body mass index is 45.56 kg/m??.    Wt Readings from Last 6 Encounters:   08/11/19 (!) 161 kg (355 lb)   08/05/19 (!) 163.3 kg (360 lb)   08/03/19 (!) 163.3 kg (360 lb)   07/30/19 (!) 164.8 kg (363 lb 5.1 oz)   07/25/19 (!) 164.4 kg (362 lb 6.4 oz)   07/22/19 (!) 166 kg (366 lb)     NAD  RRR, s1s2   CTA B   Abd benign  EXT 2+ edema  Neuro grossly normal  Psych grossly normal    Please excuse typos, dictation completed via Conservation officer, historic buildings      _____________________________________________________________  [employer] Attestation:         This document serves as a record of the services and decisions performed by Elpidio Anis, MD on 08/11/2019. It was created on his behalf by Inez Pilgrim, a trained medical scribe. The creation of this document is based on the provider's statements and observations that were conveyed to the medical scribe during the patient's encounter.     (The information in this document, created by the medical scribe for me, accurately reflects the services I personally performed and the decisions made by me. I have reviewed and approved this document for accuracy.)     Elpidio Anis  MD [facility]  Division of Cardiology   [place] [facility] [phone]  Pager [phone]

## 2019-08-12 DIAGNOSIS — I209 Angina pectoris, unspecified: Principal | ICD-10-CM

## 2019-08-12 DIAGNOSIS — I10 Essential (primary) hypertension: Principal | ICD-10-CM

## 2019-08-12 DIAGNOSIS — N186 End stage renal disease: Principal | ICD-10-CM

## 2019-08-12 DIAGNOSIS — E875 Hyperkalemia: Principal | ICD-10-CM

## 2019-08-12 DIAGNOSIS — E1143 Type 2 diabetes mellitus with diabetic autonomic (poly)neuropathy: Principal | ICD-10-CM

## 2019-08-12 DIAGNOSIS — I5042 Chronic combined systolic (congestive) and diastolic (congestive) heart failure: Principal | ICD-10-CM

## 2019-08-12 DIAGNOSIS — Z992 Dependence on renal dialysis: Principal | ICD-10-CM

## 2019-08-12 MED ORDER — HYDROXYZINE HCL 10 MG TABLET: 10 mg | tablet | Freq: Every evening | 6 refills | 30 days | Status: AC

## 2019-08-12 MED ORDER — ALBUTEROL SULFATE HFA 90 MCG/ACTUATION AEROSOL INHALER
RESPIRATORY_TRACT | 2 refills | 0 days | Status: CP
Start: 2019-08-12 — End: ?
  Filled 2019-08-18: qty 18, 25d supply, fill #0

## 2019-08-12 MED ORDER — HYDROXYZINE HCL 10 MG TABLET
ORAL_TABLET | Freq: Every evening | ORAL | 6 refills | 30.00000 days | Status: CP | PRN
Start: 2019-08-12 — End: 2019-09-11
  Filled 2019-08-12: qty 30, 30d supply, fill #0

## 2019-08-12 MED FILL — LOPERAMIDE 2 MG CAPSULE: 8 days supply | Qty: 30 | Fill #0 | Status: AC

## 2019-08-12 MED FILL — HYDROXYZINE HCL 10 MG TABLET: 30 days supply | Qty: 30 | Fill #0 | Status: AC

## 2019-08-12 NOTE — Unmapped (Signed)
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 their own place, however doesn't know when. Pt states it will be after the neighbors are evicted, we can move in. Pt will need to start dialysis soon. Pt and wife Not talking with daughter and grandson lately.      Social Determinants of Health     Financial Resource Strain: Low Risk    ??? Difficulty of Paying Living Expenses: Not very hard   Food Insecurity: Food Insecurity Present   ??? Worried About Programme researcher, broadcasting/film/video in the Last Year: Sometimes true   ??? Ran Out of Food in the Last Year: Sometimes true   Transportation Needs: No Transportation Needs   ??? Lack of Transportation (Medical): No   ??? Lack of Transportation (Non-Medical): No   Physical Activity:    ??? Days of Exercise per Week:    ??? Minutes of Exercise per Session:    Stress: Stress Concern Present   ??? Feeling of Stress : Rather much   Social Connections: Moderately Isolated   ??? Frequency of Communication with Friends and Family: More than three times a week   ??? Frequency of Social Gatherings with Friends and Family: Twice a week   ??? Attends Religious Services: Never   ??? Active Member of Clubs or Organizations: No   ??? Attends Banker Meetings: Never   ??? Marital Status: Married         LEGAL INVOLVEMENT:  deferred      FAMILY HISTORY:   Family History   Problem Relation Age of Onset   ???  Diabetes Mother    ??? Hyperlipidemia Mother    ??? Heart disease Mother    ??? Basal cell carcinoma Mother    ??? Blindness Mother         diabeties   ??? Obesity Mother    ??? Hyperlipidemia Father    ??? Heart disease Father    ??? Lung disease Father    ??? Heart disease Half-Sister    ??? Kidney disease Half-Sister    ??? Gallbladder disease Half-Brother    ??? Obesity Half-Brother    ??? No Known Problems Daughter    ??? No Known Problems Son    ??? Obesity Daughter    ??? Melanoma Neg Hx ??? Squamous cell carcinoma Neg Hx    ??? Macular degeneration Neg Hx    ??? Glaucoma Neg Hx          OTHER HISTORY OR INFORMATION:     [place] place [place]650 for the last 3 weeks. Not afraid of losing housing.  Having home health supports 3-4 days a week  Have enough food and have been getting support from friends  Transportation not a problem          Supports:  Wife Jeremy Oliver  Dog won't leave his side when he's home       Connection with others in his dialysis group - folks that get dialysis on the same times/days he does. Mutual support.     Feeling useful to others there.        Religious and/or spiritual beliefs, if any? Are they helpful?   Strong faith              ===================================================================  ASSESSMENT      ENGAGEMENT:  Patient presented a willingness to participate in treatment  PARTICIPATION QUALITY: Active  APPEARANCE:  Appears stated age.  ORIENTED:   x3  BEHAVIOR:  Laying in bed for assessment  MEMORY (recent & remote):  Good recent and remote recall at time of assessment  ATTENTION:  Engaged and Sustained  SPEECH (language): normal for rate, volume and tone  THOUGHT PROCESSING: linear, relevant, coherent     THOUGHT CONTENT:  denies SI, HI, AVH. no delusions apparent   COGNITIVE: alert, attentive, clear level of consciousness   JUDGMENT:  Fair to Good  INSIGHT:   Fair to Good  MOOD:   The dialysis is telling on me. I'm depressed a little bit.       AFFECT:   REACTIVE      MODES OF INTERVENTION used in this session: Assessment, Exploration, Support, Clarification, Education and Life review      ==================================================================   TREATMENT PLAN      Identified treatment goals:  Get or keep PHQ / GAD under 10 points, stress and self-care management     Patient-stated health goal: get some of this stuff off my head, I'm trying to do what I can to heal myself.      Next visit to provide introduction to problem solving treatment and mind body stress reduction skill training for:     adjustment reaction with depression and anxiety, stress and self-care management        RCO in 2-3 weeks by [employer].      Assessed by IM Counseling Staff:  Izora Gala. Dolan-Soto,  MSW, LCSW      Primary Care Physician (name): Jeremy Bushman, MD    Note copied to:  Jeremy Bushman, MD  Thank you  for the referral and the opportunity to be of service.     If patient doesn't engage in counseling here and/or local supports are indicated, please give patient [employer] [phone] for linkage to local services, 24 hour crisis and mobile crisis supports.

## 2019-08-13 NOTE — Unmapped (Signed)
Pt states has not been to ER in months.   Has started HD.  Has f/u w/ PCP early June 2021.  NAD noted.  Does request medication for nausea (different than zofran)  Has follow up w/ Stephannie Peters SW next week.  Dialysis has been difficult.

## 2019-08-15 ENCOUNTER — Telehealth: Admit: 2019-08-15 | Discharge: 2019-08-16 | Payer: MEDICARE | Attending: Clinical | Primary: Clinical

## 2019-08-15 DIAGNOSIS — I5042 Chronic combined systolic (congestive) and diastolic (congestive) heart failure: Principal | ICD-10-CM

## 2019-08-15 DIAGNOSIS — I10 Essential (primary) hypertension: Principal | ICD-10-CM

## 2019-08-15 DIAGNOSIS — N186 End stage renal disease: Principal | ICD-10-CM

## 2019-08-15 DIAGNOSIS — E1143 Type 2 diabetes mellitus with diabetic autonomic (poly)neuropathy: Principal | ICD-10-CM

## 2019-08-15 DIAGNOSIS — E875 Hyperkalemia: Principal | ICD-10-CM

## 2019-08-15 DIAGNOSIS — Z992 Dependence on renal dialysis: Principal | ICD-10-CM

## 2019-08-17 DIAGNOSIS — E1143 Type 2 diabetes mellitus with diabetic autonomic (poly)neuropathy: Principal | ICD-10-CM

## 2019-08-17 DIAGNOSIS — I5042 Chronic combined systolic (congestive) and diastolic (congestive) heart failure: Principal | ICD-10-CM

## 2019-08-17 DIAGNOSIS — Z992 Dependence on renal dialysis: Principal | ICD-10-CM

## 2019-08-17 DIAGNOSIS — I10 Essential (primary) hypertension: Principal | ICD-10-CM

## 2019-08-17 DIAGNOSIS — N186 End stage renal disease: Principal | ICD-10-CM

## 2019-08-17 DIAGNOSIS — E875 Hyperkalemia: Principal | ICD-10-CM

## 2019-08-17 NOTE — Unmapped (Signed)
Patient's wife scheduled appt with Carmelia Bake on his behalf.    Saddie Benders  08/17/19

## 2019-08-18 ENCOUNTER — Ambulatory Visit: Admit: 2019-08-18 | Discharge: 2019-08-20 | Disposition: A | Discharge status: Home / Self Care | Payer: MEDICARE

## 2019-08-18 LAB — CBC W/ AUTO DIFF
BASOPHILS ABSOLUTE COUNT: 0.1 10*9/L (ref 0.0–0.1)
BASOPHILS RELATIVE PERCENT: 1 %
EOSINOPHILS RELATIVE PERCENT: 1.9 %
HEMATOCRIT: 31.9 % — ABNORMAL LOW (ref 38.0–50.0)
HEMOGLOBIN: 10.4 g/dL — ABNORMAL LOW (ref 13.5–17.5)
LYMPHOCYTES ABSOLUTE COUNT: 1.5 10*9/L (ref 0.7–4.0)
LYMPHOCYTES RELATIVE PERCENT: 14.5 %
MEAN CORPUSCULAR HEMOGLOBIN CONC: 32.6 g/dL (ref 30.0–36.0)
MEAN CORPUSCULAR HEMOGLOBIN: 26.5 pg (ref 26.0–34.0)
MEAN CORPUSCULAR VOLUME: 81.3 fL (ref 81.0–95.0)
MEAN PLATELET VOLUME: 8 fL (ref 7.0–10.0)
MONOCYTES ABSOLUTE COUNT: 0.7 10*9/L (ref 0.1–1.0)
MONOCYTES RELATIVE PERCENT: 6.9 %
NEUTROPHILS ABSOLUTE COUNT: 7.9 10*9/L — ABNORMAL HIGH (ref 1.7–7.7)
NEUTROPHILS RELATIVE PERCENT: 75.7 %
NUCLEATED RED BLOOD CELLS: 0 /100{WBCs} (ref <=4)
PLATELET COUNT: 271 10*9/L (ref 150–450)
RED BLOOD CELL COUNT: 3.92 10*12/L — ABNORMAL LOW (ref 4.32–5.72)
RED CELL DISTRIBUTION WIDTH: 16.4 % — ABNORMAL HIGH (ref 12.0–15.0)
WBC ADJUSTED: 10.4 10*9/L (ref 3.5–10.5)

## 2019-08-18 LAB — BASIC METABOLIC PANEL
ANION GAP: 7 mmol/L (ref 3–11)
BLOOD UREA NITROGEN: 30 mg/dL — ABNORMAL HIGH (ref 9–23)
BUN / CREAT RATIO: 7
CALCIUM: 8.7 mg/dL (ref 8.7–10.4)
CHLORIDE: 105 mmol/L (ref 98–107)
CREATININE: 4.2 mg/dL — ABNORMAL HIGH (ref 0.60–1.10)
EGFR CKD-EPI AA MALE: 18 mL/min/{1.73_m2}
EGFR CKD-EPI NON-AA MALE: 15 mL/min/{1.73_m2}
GLUCOSE RANDOM: 184 mg/dL — ABNORMAL HIGH (ref 70–179)
POTASSIUM: 4.2 mmol/L (ref 3.5–5.1)
SODIUM: 136 mmol/L (ref 135–145)

## 2019-08-18 LAB — TROPONIN I: Troponin I.cardiac:MCnc:Pt:Ser/Plas:Qn:: <0.06

## 2019-08-18 LAB — GLUCOSE RANDOM: Glucose:MCnc:Pt:Ser/Plas:Qn:: 184 — ABNORMAL HIGH

## 2019-08-18 LAB — NEUTROPHILS ABSOLUTE COUNT: Neutrophils:NCnc:Pt:Bld:Qn:Automated count: 7.9 — ABNORMAL HIGH

## 2019-08-18 LAB — PRO-BNP: Natriuretic peptide.B prohormone N-Terminal:MCnc:Pt:Ser/Plas:Qn:: 7270 — ABNORMAL HIGH

## 2019-08-18 MED FILL — VENTOLIN HFA 90 MCG/ACTUATION AEROSOL INHALER: 25 days supply | Qty: 18 | Fill #0 | Status: AC

## 2019-08-18 MED FILL — NITROGLYCERIN 0.4 MG SUBLINGUAL TABLET: 30 days supply | Qty: 100 | Fill #0 | Status: AC

## 2019-08-18 NOTE — Unmapped (Signed)
[facility]  Emergency Department Provider Note    ED Clinical Impression     Final diagnoses:   None       Initial Impression, ED Course, Assessment and Plan     Impression: Patient is a 49 y.o. 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     On exam, the patient appears non-toxic and is in NAD. Vital signs are within normal limits.     Differential includes ACS vs myocarditis vs pericarditis vs pulmonary edema vs pleural effusion vs musculoskeletal pain.    Plan for cardiac workup.     2:22 PM   Discussed with patient his need for admission for chest pain rule out, diuresis, and dialysis but he became acutely agitated. Patient started cursing at wife, who stormed out, and patient then said to take the wires off. I am leaving. I do not care if I get the stent or not.    HEART Score for Major Cardiac Events   History: S/SX concerning for ACS (08/18/19 1218)  ECG: Normal (08/18/19 1218)  Age: 28-64 (08/18/19 1218)  Risk Factors: 3+ risk factors OR Hx of AMI, PCI, CABG, PAD or stroke (08/18/19 1218)              Additional Medical Decision Making     I have reviewed the vital signs and the nursing notes. Labs and radiology results that were available during my care of the patient were independently reviewed by me and considered in my medical decision making.     I staffed the case with the ED attending, Dr. Baird Lyons.    I independently visualized the EKG tracing.   I independently visualized the radiology images.   I reviewed the patient's prior medical records (5/1 discharge summary and other charting).   I discussed the case with the admitting provider.     Portions of this record have been created using Scientist, clinical (histocompatibility and immunogenetics). Dictation errors have been sought, but Ramsaran not have been identified and corrected.  ____________________________________________       History     Chief Complaint  Chest Pain      HPI   Jeremy Oliver is a 49 y.o. male with a PMH of CKD, CAD s/p 7 DES, AICD, T2DM, HTN, HLD, HFrEF (EF 35% w G1DD), NSTEMI, TIA, A-fib??on??Apixaban presenting with chest pain. Patient reports an episode of a substernal chest tightness this morning after he awoke along with numbness in his left arm. States that he went to dialysis this morning but was sent to the ED for evaluation of his chest pain before receiving dialysis. Endorses feeling at his baseline currently. Cites history of similar chest pain with previous MI. Denies any leg swelling or shortness of breath.     Per chart, patient was admitted from 4/26-5/1 for new start of dialysis. Patient is scheduled for a heart cath tomorrow at [facility].      Past Medical History:   Diagnosis Date   ??? Angina at rest (CMS-[name])    ??? Arthritis    ??? Asthma    ??? Atrial fibrillation and flutter (CMS-[name]) 06/08/14   ??? BMI 40.0-44.9, adult (CMS-[name]) 10/29/2015   ??? Chronic anticoagulation 02/17/2018   ??? Chronic kidney disease, stage 3    ???  Chronic pain syndrome 04/11/2014    ~Use daily lyrica as recommended ~Tramadol as needed for pain ~Fluoxetine daily as recommended ~Daily exercise ~Eat a healthy diet ~Good control of your blood sugars can help    ??? Coronary artery disease    ??? Depression    ??? Diabetes mellitus (CMS-[name])    ??? Eye trauma 1998    glass in both eyes and removed   ??? GERD (gastroesophageal reflux disease)    ??? Gout    ??? Homeless    ??? Hyperlipidemia    ??? Hypertension    ??? Illiteracy and low-level literacy    ??? Microscopic hematuria    ??? Migraine    ??? Nephrolithiasis    ??? 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 Nonalcoholic fatty liver disease    ??? NSTEMI (non-ST elevated myocardial infarction) (CMS-[name]) 01/13/2017   ??? Obesity    ??? Obstructive sleep apnea 2009   ??? Panic attacks    ??? 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 Seizure-like activity (CMS-[name])     ~Monitor for symptoms and report them to your primary care provider if they occur    ??? Systolic CHF, chronic (CMS-[name])     EF 40-45% 2017   ??? TIA (transient ischemic attack)        Patient Active Problem List   Diagnosis   ??? 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 Idiopathic chronic gout of left knee   ??? Hypercholesteremia   ??? Diabetes mellitus with coincident hypertension (CMS-[name])   ??? 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 Osteoarthritis   ??? Chronic nonalcoholic liver disease   ??? GERD (gastroesophageal reflux disease)   ??? Mild intermittent asthma without complication   ??? Chronic pain syndrome   ??? Paroxysmal atrial fibrillation (CMS-[name])   ??? OSA (obstructive sleep apnea)   ??? Tobacco use disorder   ??? Status post cardiac pacemaker procedure   ??? Diabetes mellitus with gastroparesis (CMS-[name])   ??? Cardiac pacemaker in situ   ??? Recurrent major depressive disorder, in full remission (CMS-[name])   ??? Adjustment disorder with mixed anxiety and depressed mood   ??? Hyperkalemia   ??? Acquired left foot drop   ??? Decreased sensation of lower extremity, left   ??? Chronic anticoagulation   ??? Type 2 diabetes mellitus with stage 4 chronic kidney disease, with long-term current use of insulin (CMS-[name])   ??? Situational stress   ??? Cough   ??? QT prolongation   ??? ESRD needing dialysis (CMS-[name])   ??? Palliative care by specialist   ??? Angina pectoris (CMS-[name])   ??? Psychophysiological insomnia       Past Surgical History:   Procedure Laterality Date   ??? CARDIAC CATHETERIZATION  03/08/2007   ??? CORONARY STENT PLACEMENT     ??? CYSTOURETHROSCOPY  12/31/2009     Microscopic hematuria   ??? KNEE SURGERY Right 09/23/2006    arthroscopic knee surgery medial and  lateral meniscus tears as well as crystal disease   ??? PR CATH PLACE/CORON ANGIO, IMG SUPER/INTERP,R&L HRT CATH, L HRT VENTRIC N/A 02/13/2017    Procedure: Left/Right Heart Catheterization;  Surgeon: Marlaine Hind, MD;  Location: [facility] CATH;  Service: Cardiology   ??? PR CATH PLACE/CORON ANGIO, IMG SUPER/INTERP,W LEFT HEART VENTRICULOGRAPHY N/A 03/17/2017    Procedure: Left Heart Catheterization W Intervention;  Surgeon: Marlaine Hind, MD;  Location: [facility] CATH;  Service: Cardiology   ??? PR CATH PLACE/CORON ANGIO, IMG SUPER/INTERP,W LEFT HEART VENTRICULOGRAPHY N/A 05/12/2018    Procedure: CATH LEFT HEART CATHETERIZATION W INTERVENTION;  Surgeon: Dorathy Kinsman, MD;  Location: [facility] CATH;  Service: Cardiology   ??? PR CATH PLACE/CORON ANGIO, IMG SUPER/INTERP,W LEFT HEART VENTRICULOGRAPHY N/A 10/22/2018    Procedure: CATH LEFT HEART CATHETERIZATION W INTERVENTION;  Surgeon: Marlaine Hind, MD;  Location: [facility] CATH;  Service: Cardiology   ??? PR ECMO/ECLS INITIATION VENO-ARTERIAL N/A 03/19/2017    Procedure: ECMO/ECLS; INITIATION, VENO-ARTERIAL;  Surgeon: Lennie Odor, MD;  Location: MAIN OR [facility];  Service: Cardiac Surgery   ??? PR ECMO/ECLS RMVL PRPH CANNULA OPEN 6 YRS & OLDER Left 03/25/2017    Procedure: ECMO / ECLS PROVIDED BY PHYSICIAN; REMOVAL OF PERIPHERAL (ARTERIAL AND/OR VENOUS) CANNULA(E), OPEN, 6 YEARS AND OLDER;  Surgeon: Alonna Buckler Ikonomidis, MD;  Location: MAIN OR [facility];  Service: Cardiac Surgery   ??? PR EPHYS EVAL W/ ABLATION SUPRAVENT ARRHYTHMIA N/A 07/19/2015    Catheter ablation of cavotricuspid isthmus for atrial flutter [facility])   ??? PR INSER HART PACER XVENOUS ATRIAL N/A 05/30/2015 [employer] dual-chamber pacemaker implant (E.Chung)   ??? PR INSERT/PLACE FLOW DIRECT CATH N/A 03/17/2017    Procedure: Insert Leave In [place];  Surgeon: Marlaine Hind, MD;  Location: [facility] CATH;  Service: Cardiology   ??? PR INSJ/RPLCMT PERM DFB W/TRNSVNS LDS 1/DUAL CHMBR N/A 05/04/2017    Procedure: ICD Implant System (Single/Dual);  Surgeon: Eldred Manges, MD;  Location: [facility] CATH;  Service: Cardiology   ??? PR NEGATIVE PRESSURE WOUND THERAPY DME >50 SQ CM Left 03/25/2017    Procedure: Neg Press Wound Tx (Vac Assist) Incl Topicals, Per Session, Tsa Greater Than/= 50 Cm Squared;  Surgeon: Alonna Buckler Ikonomidis, MD;  Location: MAIN OR [facility];  Service: Cardiac Surgery   ??? PR PRQ TRLUML CORONARY STENT W/ANGIO ONE ART/BRNCH N/A 03/12/2018    Procedure: Percutaneous Coronary Intervention;  Surgeon: Marlaine Hind, MD;  Location: [facility] CATH;  Service: Cardiology   ??? PR REBL VES DIRECT,LOW EXTREM Left 03/19/2017    Procedure: Repr Bld Vessel Direct; Lower Extrem;  Surgeon: Boykin Reaper, MD;  Location: MAIN OR [facility];  Service: Vascular   ??? PR REMV ART CLOT ILIAC-POP,LEG INCIS Left 03/25/2017    Procedure: EMBOLECTOMY OR THROMBECTOMY, WITH OR WITHOUT CATHETER; FEMOROPOPLITEAL, AORTOILIAC ARTERY, BY LEG INCISION;  Surgeon: Alonna Buckler Ikonomidis, MD;  Location: MAIN OR [facility];  Service: Cardiac Surgery   ??? PR UPPER GI ENDOSCOPY,BIOPSY N/A 02/28/2016    Procedure: UGI ENDOSCOPY; WITH BIOPSY, SINGLE OR MULTIPLE;  Surgeon: Liane Comber, MD;  [facility]: [facility];  Service: Gastroenterology   ??? PR UPPER GI ENDOSCOPY,DIAGNOSIS N/A 05/07/2016    Procedure: UGI ENDO, INCLUDE ESOPHAGUS, STOMACH, & DUODENUM &/OR JEJUNUM; DX W/WO COLLECTION SPECIMN, BY BRUSH OR WASH;  Surgeon: Modena Nunnery, MD;  Location: [facility];  Service: Gastroenterology       No current facility-administered medications for this encounter.    Current Outpatient Medications:   ???  acetaminophen (TYLENOL) 500 MG tablet, TAKE 2 TABLETS (1 000MG ) BY MOUTH EVERY 8 HOURS AS NEEDED FOR PAIN, Disp: 180 tablet, Rfl: 0  ???  albuterol HFA 90 mcg/actuation inhaler, Inhale 2 puffs into the lungs every 6 (six) hours as needed for wheezing or shortness of breath., Disp: 18 g, Rfl: 2  ???  alirocumab (PRALUENT) 150 mg/mL subcutaneous injection, Inject the contents of 1 pen (150 mg total) under the skin every fourteen (14) days., Disp: 6 mL, Rfl: 3  ???  allopurinoL (ZYLOPRIM) 100 MG tablet, Take 2 tablets (200 mg total) by mouth daily., Disp: 60 tablet, Rfl: 5  ???  amiodarone (PACERONE) 200 MG tablet, TAKE 1 TABLET BY MOUTH ONCE DAILY, Disp: 90 tablet, Rfl: 3  ???  apixaban (ELIQUIS) 5 mg Tab, Take 1 tablet (5 mg total) by mouth Two (2) times a day., Disp: 60 tablet, Rfl: 11  ???  atorvastatin (LIPITOR) 80 MG tablet, Take 1 tablet (80 mg total) by mouth daily., Disp: 90 tablet, Rfl: 3  ???  blood sugar diagnostic Strp, Disp to match current ins approved meter. Use to test blood sugar four times daily. E11.22, Z79.4, Disp: 400 each, Rfl: 3  ???  blood-glucose meter kit, Disp. blood glucose meter kit preferred by patient's insurance. Dx: Diabetes, E11.9, Disp: 1 each, Rfl: 11  ???  carvediloL (COREG) 25 MG tablet, Take 1 tablet (25 mg total) by mouth Two (2) times a day. HOLD morning dose on dialysis days, Disp: 60 tablet, Rfl: 0  ???  cholecalciferol, vitamin D3 25 mcg, 1,000 units,, 1,000 unit (25 mcg) tablet, Take 1 tablet (25 mcg total) by mouth daily., Disp: 100 tablet, Rfl: 11  ???  diclofenac sodium (VOLTAREN) 1 % gel, Apply 2 grams topically Three (3) times a day., Disp: 180 g, Rfl: 11  ???  ezetimibe (ZETIA) 10 mg tablet, TAKE 1 TABLET BY MOUTH ONCE DAILY, Disp: 90 each, Rfl: 3  ???  FLUoxetine (PROZAC) 40 MG capsule, Take 2 capsules (80 mg total) by mouth daily., Disp: 180 capsule, Rfl: 3  ???  gabapentin (NEURONTIN) 300 MG capsule, Take 1 capsule (300 mg total) by mouth two (2) times a day., Disp: 180 capsule, Rfl: 3  ???  hydrOXYzine (ATARAX) 10 MG tablet, Take 1 tablet (10 mg total) by mouth nightly as needed for anxiety., Disp: 30 tablet, Rfl: 6  ???  insulin ASPART (NOVOLOG FLEXPEN U-100 INSULIN) 100 unit/mL (3 mL) injection pen, Inject 15 units under skin three times a day with meals, Disp: 15 mL, Rfl: 0  ???  insulin detemir U-100 (LEVEMIR FLEXTOUCH U-100 INSULN) 100 unit/mL (3 mL) injection pen, Inject 37 units total under the skin nightly, Disp: 15 mL, Rfl: 3  ???  insulin syringe-needle U-100 0.3 mL 31 gauge x 5/16 Syrg, Use to inject insulin twice daily with meals, Disp: 60 each, Rfl: 0  ???  isosorbide mononitrate (IMDUR) 30 MG 24 hr tablet, Take 1 tablet (30 mg total) by mouth daily. DO NOT TAKE on dialysis days, Disp: 120 tablet, Rfl: 3  ???  lancets Misc, Disp. Ins preferred lancets. Use to test blood sugar four times daily. E11.22, Z79.4, Disp: 400 each, Rfl: 3  ???  loperamide (IMODIUM) 2 mg capsule, Take 1 capsule (2 mg total) by mouth 4 (four) times a day as needed for diarrhea., Disp: 30 capsule, Rfl: 3  ???  miscellaneous medical supply (BLOOD PRESSURE CUFF) Misc, Measure BP once a day or if any symptoms, Disp: 1 each, Rfl: 0  ???  nicotine polacrilex (NICORETTE) 4 MG gum, Apply 1 each (4 mg total) to cheek every hour as needed for smoking cessation., Disp: 110 each, Rfl: 0  ???  nitroglycerin (NITROSTAT) 0.4 MG SL tablet, Place 1 tablet (0.4 mg total) under the tongue every five (5) minutes as needed for chest pain., Disp: 100 each, Rfl: 3  ???  omeprazole (PRILOSEC) 40 MG capsule, Take 1 capsule (40 mg total) by mouth Two (2) times a day (30 minutes before a meal)., Disp: 180 capsule, Rfl: 3  ???  ondansetron (ZOFRAN) 4 MG tablet, Take 1 tablet (4 mg total) by mouth every eight (8) hours as needed for nausea for up to 4 days., Disp: 10 tablet, Rfl: 0  ???  semaglutide 0.25 mg or 0.5 mg(2 mg/1.5 mL) PnIj, Inject 0.5 mg under the skin every seven (7) days., Disp: 4.5 mL, Rfl: 3  ???  sevelamer (RENVELA) 800 mg tablet, TAKE 2 TABLETS (1600MG ) BY MOUTH THREE TIMES DAILY WITH MEALS, Disp: 540 tablet, Rfl: 3  ???  ticagrelor (BRILINTA) 90 mg Tab, TAKE 1 TABLET BY MOUTH TWICE DAILY, Disp: 180 tablet, Rfl: 3  ???  UNIFINE PENTIPS 31 gauge x 5/16 (8 mm) Ndle, USE WITH INSULIN AND VICTOZA 5 TIMES DAILY, Disp: 400 each, Rfl: 3    Allergies  Losartan and Opioids - morphine analogues    Family History   Problem Relation Age of Onset   ??? Diabetes Mother    ??? Hyperlipidemia Mother    ??? Heart disease Mother    ??? Basal cell carcinoma Mother    ??? Blindness Mother         diabeties   ??? Obesity Mother    ??? Hyperlipidemia Father    ??? Heart disease Father    ??? Lung disease Father    ??? Heart disease Half-Sister    ??? Kidney disease Half-Sister    ??? Gallbladder disease Half-Brother    ??? Obesity Half-Brother    ??? No Known Problems Daughter    ??? No Known Problems Son    ??? Obesity Daughter    ??? Depression Maternal Uncle         Died by suicide in 10-03-2017 by gunshot   ??? Melanoma Neg Hx    ??? Squamous cell carcinoma Neg Hx    ??? Macular degeneration Neg Hx    ??? Glaucoma Neg Hx        Social History  Social History     Tobacco Use   ??? Smoking status: Never Smoker   ??? Smokeless tobacco: Current User     Types: Chew   ??? Tobacco comment: 08/15/19:  Dips 1x a day./ /Pt in process of reducing tobacco use,  dips 1 can every 3 weeks,   Vaping Use   ??? Vaping Use: Never used   Substance Use Topics   ??? Alcohol use: Never     Alcohol/week: 0.0 standard drinks     Comment: rare use: couple times a year, small amount   ??? Drug use: No       Review of Systems   All other systems have been reviewed and are negative except as otherwise documented.     Physical Exam     ED Triage Vitals [08/18/19 1103]   Enc Vitals Group      BP 178/93      Heart Rate 100      SpO2 Pulse       Resp 16  Temp 36.8 ??C (98.2 ??F)      Temp Source Oral      SpO2 97 %     Constitutional: Alert and oriented. Non-toxic appearing and in no distress.  Eyes: Conjunctivae are normal.  ENT       Head: Normocephalic and atraumatic.       Nose: No congestion.       Mouth/Throat: Mucous membranes are moist.       Neck: No stridor.  Hematological/Lymphatic/Immunilogical: No cervical lymphadenopathy.  Cardiovascular: Normal rate, regular rhythm. Normal and symmetric distal pulses are present in all extremities.  Respiratory: Normal respiratory effort. Breath sounds are normal.  Gastrointestinal: Soft and nontender. There is no CVA tenderness.  Musculoskeletal: Normal range of motion in all extremities.       Right lower leg: No tenderness or edema.       Left lower leg: No tenderness or edema.  Neurologic: Normal speech and language. No gross focal neurologic deficits are appreciated.  Skin: Skin is warm, dry and intact. No rash noted.  Psychiatric: Mood and affect are normal. Speech and behavior are normal.      EKG     ***    Radiology     XR Chest 2 views    (Results Pending)          Procedures     N/a       Documentation assistance was provided by Sherle Poe, Scribe, on Creveling 20, 2021 at 11:55 AM for Larina Earthly, MD.       {***NOTE TO PROVIDER: PLEASE ADD EDPROVSCRIBEATTEST NOTING YOU AGREE WITH SCRIBE DOCUMENTATION}

## 2019-08-18 NOTE — Unmapped (Signed)
Pt to ED for reports of CP since this AM.  Went to dialysis and they told him to come to ED.  Did not receive dialysis.

## 2019-08-18 NOTE — Unmapped (Addendum)
Care Management  Initial Transition Planning Assessment    CM met with patient in pt room.  Pt/visitors were wearing hospital provided masks for the duration of the interaction with CM. CM was wearing hospital provided surgical mask and hospital provided eye protection.  CM was not within 6 foot of the patient/visitors during this interaction.       Patient has been identified as a Runner, broadcasting/film/video.   Longitudinal Plan of Care reviewed, the following information is noted for continuity of care:  Pt lives with wife in [place]. Last year, they had problem with housing and was temporarily staying with their daughter in [place]. Their daughter has since moved to [place] and they have now found their own place in [place]. Pt is a new start dialysis and goes to [facility] in [facility] 10:20am shift. He is open with [facility] for RN and PT.  ??  Home Safety Screening  Patient Questions:                    1.  Before the illness or injury that brought you to the ED, did you need someone to help you on a regular basis? Do you need people to help you with activities of daily living?  ? Yes - his wife helps him out  2.  In the last 24 hours, have you needed more help than usual?  ? Yes - chest pain while in HD  3.  Have you been hospitalized for one or more nights during the past six months?   ? Yes - per chart  4.  Have you been to an emergency room one or more times in the past six months?  ? No  5.  In general, do you have problems with your vision that cannot be corrected with glasses/contacts?  ? No  6.  In general, do you have problems with your memory?  ? No  7.  Do you take six or more different medications every day?   ? Yes - per chart  8. Do you have any trouble getting, taking, keeping up with or paying for your medications?   ? No  9   Have you had any falls over the past few weeks or are you concerned about falling?  ? No  10.  Are you having trouble accessing and/or preparing the meals you need?  ? No  11. Do you have wounds or bed ulcers?  ? No  12  Do you have access to transportation?                          Yes - wife can drive him  ??  Care Manager Questions:  Is the patient over the age of 84?  ? No  *Home Safety Evaluation endorsed 4 of the screening items and will update personal health advocate. Personal Health Advocate Pool contacted by In Basket message regarding post-acute services and collaboration around plan of care.    When patient/family asked, Were the services I provided/discussed with you today of value?, patient/family responded Y/N? Yes - pt did not want to be admitted. ED MD made aware.              General  Care Manager assessed the patient by : In person interview with patient, In person interview with family, Medical record review, Discussion with Clinical Care team  Orientation Level: Oriented X4  Functional  level prior to admission: Independent  Reason for referral: Discharge Planning, Home Health, Other - specify in comments  Additional Information: ACO pt   Type of Residence: Mailing Address:  [address]  [place] [address]  Contacts: Accompanied by: Family member   Extended Emergency Contact Information  Primary Emergency Contact: Seger,[name]  Address: [address]           [place], [place] [address] [place] [employer] Phone: [phone]  Relation: Spouse  Secondary Emergency Contact: Crumby,Bridgette   [place]  Home Phone: [phone]  Mobile Phone: [phone]  Relation: Daughter  Patient Phone Number: [phone] (home)         Medical Provider(s): Kurtis Bushman, MD  Reason for Admission: Admitting Diagnosis:  No admission diagnoses are documented for this encounter.  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  Past Surgical History:   has a past surgical history that includes Knee surgery (Right, 09/23/2006); Cardiac catheterization (03/08/2007); Cystourethroscopy (12/31/2009 ); pr inser hart pacer xvenous atrial (N/A, 05/30/2015); pr ephys eval w/ ablation supravent arrhythmia (N/A, 07/19/2015); pr upper gi endoscopy,biopsy (N/A, 02/28/2016); pr upper gi endoscopy,diagnosis (N/A, 05/07/2016); pr cath place/coron angio, img super/interp,r&l hrt cath, l hrt ventric (N/A, 02/13/2017); pr cath place/coron angio, img super/interp,w left heart ventriculography (N/A, 03/17/2017); pr insert/place flow direct cath (N/A, 03/17/2017); pr ecmo/ecls initiation veno-arterial (N/A, 03/19/2017); pr rebl ves direct,low extrem (Left, 03/19/2017); pr ecmo/ecls rmvl prph cannula open 6 yrs & older (Left, 03/25/2017); pr remv art clot iliac-pop,leg incis (Left, 03/25/2017); pr negative pressure wound therapy dme >50 sq cm (Left, 03/25/2017); pr insj/rplcmt perm dfb w/trnsvns lds 1/dual chmbr (N/A, 05/04/2017); pr prq trluml coronary stent w/angio one art/brnch (N/A, 03/12/2018); pr cath place/coron angio, img super/interp,w left heart ventriculography (N/A, 05/12/2018); pr cath place/coron angio, img super/interp,w left heart ventriculography (N/A, 10/22/2018); and Coronary stent placement.   Previous admit date: 07/26/2019    Primary Insurance- Payor: MEDICARE / Plan: MEDICARE PART A AND PART B / Product Type: *No Product type* /   Secondary Insurance ??? Secondary Insurance  MEDICAID [id number]  Prescription Coverage ??? same as above  Preferred Pharmacy - [facility] - [place], [place] - [address]  [facility]  [facility]  [facility]    Transportation home: Private vehicle    Contact/Decision Maker  Extended Emergency Contact Information  Primary Emergency Contact: Frank,[name]  Address: [address]           [place], [place] [address] [place] [employer] Phone: [phone]  Relation: Spouse  Secondary Emergency Contact: Vaux,Bridgette   [place]  Home Phone: [phone]  Mobile Phone: [phone]  Relation: Daughter    Legal Next of Kin / Guardian / POA / Advance Directives     [name] [facility]): Johannesen,[name] - [phone]  [name], First Alternate: Grissett,Bridgette - Daughter - [phone]    Advance Directive (Medical Treatment)  Does patient have an advance directive covering medical treatment?: Patient has advance directive covering medical treatment, copy in chart.  Reason patient does not have an advance directive covering medical treatment:: Patient needs follow-up to complete one.    Health Care Decision Maker [[name]] (Medical & Mental Health Treatment)  Healthcare Decision Maker: [name] documented in the [name]/Contact Info section.    Patient Information  Lives with: Spouse/significant other    Type of Residence: Private residence     Location/Detail: [address] [place] [address]    Support Systems/Concerns: Children, Family Members, Spouse    Responsibilities/Dependents at home?: No    Home Care services in place prior to admission?: Yes  Type of Home Care services in place prior to admission: Home nursing visits, Home PT  Current Home Care provider (Name/Phone #): [facility] Agency detail (Name/Phone #): Pt has signed off by Cicero Duck with palliative care in the outpatient clinic, pt advised to continue with [facility]. Also followed by cardiology, nephrology, rheumatology, and Internal medicine    Equipment Currently Used at Home: cane, straight, walker, rolling, respiratory supplies (BiPAP)  Current HME Agency (Name/Phone #): BiPAP supplies through [facility]    Currently receiving outpatient dialysis?: Yes  Facility providing dialysis (Name/Contact Info): [facility] [place] Tue/Thurs/Sat 10:20am    Financial Information    Need for financial assistance?: No    Social Determinants of Health  Social Determinants of Health     Tobacco Use: High Risk   ??? Smoking Tobacco Use: Never Smoker   ??? Smokeless Tobacco Use: Current User   Alcohol Use: Not At Risk   ??? How often do you have 5 or more drinks on one occasion?: 0   ??? How many drinks containing alcohol do you have on a typical day when you are drinking?: 0   ??? How often do you have a drink containing alcohol?: 0   Financial Resource Strain: Low Risk    ??? Difficulty of Paying Living Expenses: Not hard at all   Food Insecurity: No Food Insecurity   ??? Worried About Programme researcher, broadcasting/film/video in the Last Year: Never true   ??? Ran Out of Food in the Last Year: Never true   Transportation Needs: No Transportation Needs   ??? Lack of Transportation (Medical): No   ??? Lack of Transportation (Non-Medical): No   Physical Activity:    ??? Days of Exercise per Week:    ??? Minutes of Exercise per Session:    Stress: Stress Concern Present   ??? Feeling of Stress : Rather much   Social Connections: Moderately Isolated   ??? Frequency of Communication with Friends and Family: More than three times a week   ??? Frequency of Social Gatherings with Friends and Family: Twice a week   ??? Attends Religious Services: Never   ??? Active Member of Clubs or Organizations: No   ??? Attends Banker Meetings: Never   ??? Marital Status: Married Catering manager Violence: Not At Risk   ??? Fear of Current or Ex-Partner: No   ??? Emotionally Abused: No   ??? Physically Abused: No   ??? Sexually Abused: No   Depression: Not at risk   ??? PHQ-2 Score: 2   Housing Stability: High Risk   ??? Within the past 12 months, have you ever stayed: outside, in a car, in a tent,  in an overnight shelter, or temporarily in someone else's home (i.e. couch-surfing)?: Yes   ??? Are you worried about losing your housing?: No   ??? Within the past 12 months, have you been unable to get utilities (heat, electricity) when it was really needed?: Yes   Substance Use:    ??? Taken prescription drugs for non-medical reasons:    ??? Taken illegal drugs:    ??? Patient indicated they have taken drugs in the past year, including Cannabis, Cocaine, Prescription stimulants, Methamphetamine, Inahalnts, Sedatives or sleeping pills, Hallucinogens, Street Opioids or Prescription opiods for non-medical reasons:    Health Literacy: Low Risk    ??? : Never       Discharge Needs Assessment  Concerns to be Addressed: adjustment to diagnosis/illness, care coordination/care conferences    Clinical Risk Factors: New Diagnosis, Readmission < 30 Days    Barriers to taking medications: No    Prior overnight hospital stay or ED visit in last 90 days: Yes    Readmission Within the Last 30 Days: current reason for admission unrelated to previous admission    Anticipated Changes Related to Illness: none    Equipment Needed After Discharge: none    Discharge Facility/Level of Care Needs: other (see comments) (HH)    Readmission  Risk of Unplanned Readmission Score:  %  Predictive Model Details   No score data available for [facility] Risk of Unplanned Readmission     Readmitted Within the Last 30 Days? (No if blank)   Patient at risk for readmission?: Yes    Discharge Plan  Screen findings are: Discharge planning needs identified or anticipated (Comment).    Expected Discharge Date: 08/19/2019    Expected Transfer from Critical Care: (N/A)    Quality data for continuing care services shared with patient and/or representative?: Yes  Patient and/or family were provided with choice of facilities / services that are available and appropriate to meet post hospital care needs?: Yes   List choices in order highest to lowest preferred, if applicable. : [facility]    Initial Assessment complete?: Yes

## 2019-08-19 LAB — TROPONIN I
Troponin I.cardiac:MCnc:Pt:Ser/Plas:Qn:: 0.058
Troponin I.cardiac:MCnc:Pt:Ser/Plas:Qn:: 0.065

## 2019-08-19 LAB — BASIC METABOLIC PANEL
ANION GAP: <1 mmol/L — ABNORMAL LOW (ref 7–15)
ANION GAP: 7 mmol/L (ref 7–15)
BLOOD UREA NITROGEN: 24 mg/dL — ABNORMAL HIGH (ref 7–21)
BLOOD UREA NITROGEN: 24 mg/dL — ABNORMAL HIGH (ref 7–21)
BLOOD UREA NITROGEN: 25 mg/dL — ABNORMAL HIGH (ref 7–21)
BLOOD UREA NITROGEN: 26 mg/dL — ABNORMAL HIGH (ref 7–21)
BLOOD UREA NITROGEN: 32 mg/dL — ABNORMAL HIGH (ref 7–21)
BUN / CREAT RATIO: 7
BUN / CREAT RATIO: 8
BUN / CREAT RATIO: 8
BUN / CREAT RATIO: 8
BUN / CREAT RATIO: 9
CALCIUM: 7.7 mg/dL — ABNORMAL LOW (ref 8.5–10.2)
CALCIUM: 8.2 mg/dL — ABNORMAL LOW (ref 8.5–10.2)
CALCIUM: 8.3 mg/dL — ABNORMAL LOW (ref 8.5–10.2)
CALCIUM: 8.3 mg/dL — ABNORMAL LOW (ref 8.5–10.2)
CHLORIDE: 106 mmol/L (ref 98–107)
CHLORIDE: 107 mmol/L (ref 98–107)
CHLORIDE: 108 mmol/L — ABNORMAL HIGH (ref 98–107)
CHLORIDE: 108 mmol/L — ABNORMAL HIGH (ref 98–107)
CHLORIDE: 108 mmol/L — ABNORMAL HIGH (ref 98–107)
CO2: 23 mmol/L (ref 22.0–30.0)
CO2: 23 mmol/L (ref 22.0–30.0)
CO2: 25 mmol/L (ref 22.0–30.0)
CO2: 27 mmol/L (ref 22.0–30.0)
CO2: 28 mmol/L (ref 22.0–30.0)
CREATININE: 2.89 mg/dL — ABNORMAL HIGH (ref 0.70–1.30)
CREATININE: 2.98 mg/dL — ABNORMAL HIGH (ref 0.70–1.30)
CREATININE: 3.07 mg/dL — ABNORMAL HIGH (ref 0.70–1.30)
CREATININE: 3.21 mg/dL — ABNORMAL HIGH (ref 0.70–1.30)
CREATININE: 4.06 mg/dL — ABNORMAL HIGH (ref 0.70–1.30)
EGFR CKD-EPI AA MALE: 18 mL/min/{1.73_m2} — ABNORMAL LOW (ref >=60)
EGFR CKD-EPI AA MALE: 24 mL/min/{1.73_m2} — ABNORMAL LOW (ref >=60)
EGFR CKD-EPI AA MALE: 26 mL/min/{1.73_m2} — ABNORMAL LOW (ref >=60)
EGFR CKD-EPI AA MALE: 27 mL/min/{1.73_m2} — ABNORMAL LOW (ref >=60)
EGFR CKD-EPI AA MALE: 28 mL/min/{1.73_m2} — ABNORMAL LOW (ref >=60)
EGFR CKD-EPI NON-AA MALE: 16 mL/min/{1.73_m2} — ABNORMAL LOW (ref >=60)
EGFR CKD-EPI NON-AA MALE: 23 mL/min/{1.73_m2} — ABNORMAL LOW (ref >=60)
EGFR CKD-EPI NON-AA MALE: 24 mL/min/{1.73_m2} — ABNORMAL LOW (ref >=60)
GLUCOSE RANDOM: 108 mg/dL — ABNORMAL HIGH (ref 70–99)
GLUCOSE RANDOM: 114 mg/dL — ABNORMAL HIGH (ref 70–99)
GLUCOSE RANDOM: 160 mg/dL (ref 70–179)
GLUCOSE RANDOM: 176 mg/dL — ABNORMAL HIGH (ref 70–99)
GLUCOSE RANDOM: 177 mg/dL — ABNORMAL HIGH (ref 70–99)
POTASSIUM: 4.2 mmol/L (ref 3.5–5.0)
POTASSIUM: 4.4 mmol/L (ref 3.5–5.0)
POTASSIUM: 4.6 mmol/L (ref 3.5–5.0)
POTASSIUM: 4.6 mmol/L (ref 3.5–5.0)
POTASSIUM: 4.8 mmol/L (ref 3.5–5.0)
SODIUM: 135 mmol/L (ref 135–145)
SODIUM: 136 mmol/L (ref 135–145)
SODIUM: 136 mmol/L (ref 135–145)
SODIUM: 137 mmol/L (ref 135–145)

## 2019-08-19 LAB — CBC
HEMATOCRIT: 29.5 % — ABNORMAL LOW (ref 41.0–53.0)
HEMATOCRIT: 31.1 % — ABNORMAL LOW (ref 41.0–53.0)
HEMOGLOBIN: 9.7 g/dL — ABNORMAL LOW (ref 13.5–17.5)
MEAN CORPUSCULAR HEMOGLOBIN CONC: 31.7 g/dL (ref 31.0–37.0)
MEAN CORPUSCULAR HEMOGLOBIN CONC: 31.2 g/dL (ref 31.0–37.0)
MEAN CORPUSCULAR HEMOGLOBIN: 27.3 pg (ref 26.0–34.0)
MEAN CORPUSCULAR HEMOGLOBIN: 26.8 pg (ref 26.0–34.0)
MEAN CORPUSCULAR VOLUME: 86.2 fL (ref 80.0–100.0)
MEAN PLATELET VOLUME: 8.5 fL (ref 7.0–10.0)
MEAN PLATELET VOLUME: 8.7 fL (ref 7.0–10.0)
PLATELET COUNT: 254 10*9/L (ref 150–440)
PLATELET COUNT: 233 10*9/L (ref 150–440)
RED BLOOD CELL COUNT: 3.42 10*12/L — ABNORMAL LOW (ref 4.50–5.90)
RED CELL DISTRIBUTION WIDTH: 16 % — ABNORMAL HIGH (ref 12.0–15.0)
RED CELL DISTRIBUTION WIDTH: 16.1 % — ABNORMAL HIGH (ref 12.0–15.0)
WBC ADJUSTED: 7.7 10*9/L (ref 4.5–11.0)

## 2019-08-19 LAB — CALCIUM: Calcium:MCnc:Pt:Ser/Plas:Qn:: 8.3 — ABNORMAL LOW

## 2019-08-19 LAB — EGFR CKD-EPI AA MALE
Glomerular filtration rate/1.73 sq M.predicted.black:ArVRat:Pt:Ser/Plas/Bld:Qn:Creatinine-based formula (CKD-EPI): 18 — ABNORMAL LOW
Glomerular filtration rate/1.73 sq M.predicted.black:ArVRat:Pt:Ser/Plas/Bld:Qn:Creatinine-based formula (CKD-EPI): 24 — ABNORMAL LOW

## 2019-08-19 LAB — HEPATIC FUNCTION PANEL
ALBUMIN: 2.6 g/dL — ABNORMAL LOW (ref 3.4–5.0)
AST (SGOT): 11 U/L (ref <34)
BILIRUBIN DIRECT: 0.1 mg/dL (ref 0.00–0.30)
PROTEIN TOTAL: 6.6 g/dL (ref 5.7–8.2)

## 2019-08-19 LAB — MAGNESIUM
Magnesium:MCnc:Pt:Ser/Plas:Qn:: 1.6
Magnesium:MCnc:Pt:Ser/Plas:Qn:: 1.7
Magnesium:MCnc:Pt:Ser/Plas:Qn:: 1.7
Magnesium:MCnc:Pt:Ser/Plas:Qn:: 1.7

## 2019-08-19 LAB — APTT
APTT: 35.1 s (ref 25.3–37.1)
Coagulation surface induced:Time:Pt:PPP:Qn:Coag: 35.1

## 2019-08-19 LAB — BILIRUBIN TOTAL: Bilirubin:MCnc:Pt:Ser/Plas:Qn:: 0.3

## 2019-08-19 LAB — RED BLOOD CELL COUNT: Lab: 3.42 — ABNORMAL LOW

## 2019-08-19 LAB — PHOSPHORUS
Phosphate:MCnc:Pt:Ser/Plas:Qn:: 2.9
Phosphate:MCnc:Pt:Ser/Plas:Qn:: 3.5
Phosphate:MCnc:Pt:Ser/Plas:Qn:: 3.9

## 2019-08-19 LAB — HEPARIN CORRELATION: Lab: 0.2

## 2019-08-19 LAB — SODIUM
Sodium:SCnc:Pt:Ser/Plas:Qn:: 135
Sodium:SCnc:Pt:Ser/Plas:Qn:: 137

## 2019-08-19 LAB — MEAN CORPUSCULAR VOLUME: Erythrocyte mean corpuscular volume:EntVol:Pt:RBC:Qn:Automated count: 86

## 2019-08-19 NOTE — Unmapped (Signed)
Received notification from CICU team that Jeremy Oliver presented with active chest pain c/b syncope and is on a nitro drip being transferred this evening to the CICU. Currently planned for [facility] tomorrow but there is some concern for volume overload and unable to lay flat for the cath procedure.    - Defer volume assessment and ability to lay flat for LHC to CICU provider team.  - If there is concern for volume overload precluding LHC, will plan for CRRT overnight for hypervolemia prior to LHC in AM given he is high risk for ongoing active chest pain and he is still using a tunneled HD cathter for access.  - If there is not concern for volume overload, will plan for regularly scheduled iHD after LHC tomorrow give other labs and electrolytes are stable.    Armandina Stammer, MD  Nephrology Fellow

## 2019-08-19 NOTE — Unmapped (Signed)
Mr. Dimiceli is a 49 year old male. He remains afebrile on nasal cannula. Patient arrived from outside hospital around 930 pm. Nitroglycerin gtt running at this time, titrating for chest pain. Arterial line placed at bedside by cicu fellow megan sattler. During procedure patient was difficult to arouse, not responding to verbal stimuli. Vital signs unchanged at this time and Vernona Rieger, MD remained at bedside to assess patient. Symptoms improved. Nitroglycerin gtt stopped due to hypotension and norepi gtt started to maintain map goal. CRRT started by dialysis nurse. No chest pain reported at this time. No signs of discomfort observed by rn or reported by patient. Norepi gtt stopped due to hypertension. Will continue to monitor patient for any changes.  Problem: Adult Inpatient Plan of Care  Goal: Plan of Care Review  Outcome: Ongoing - Unchanged  Goal: Patient-Specific Goal (Individualization)  Outcome: Ongoing - Unchanged  Goal: Absence of Hospital-Acquired Illness or Injury  Outcome: Ongoing - Unchanged  Goal: Optimal Comfort and Wellbeing  Outcome: Ongoing - Unchanged  Goal: Readiness for Transition of Care  Outcome: Ongoing - Unchanged  Goal: Rounds/Family Conference  Outcome: Ongoing - Unchanged     Problem: Heart Failure Comorbidity  Goal: Maintenance of Heart Failure Symptom Control  Outcome: Ongoing - Unchanged     Problem: Hypertension Comorbidity  Goal: Blood Pressure in Desired Range  Outcome: Ongoing - Unchanged     Problem: Obstructive Sleep Apnea Risk or Actual (Comorbidity Management)  Goal: Unobstructed Breathing During Sleep  Outcome: Ongoing - Unchanged

## 2019-08-19 NOTE — Unmapped (Signed)
Attempted to call report. PLC unaware of room assignment.  Needs to alert CAC.  Will call back.

## 2019-08-19 NOTE — Unmapped (Signed)
Nephrology ESRD Consultation Note    Requesting Attending Physician: Lesle Reek, MD  Service Requesting Consult: Cardiology [facility])    Reason for consult: ESRD, hemodialysis dependence    Outpatient dialysis unit: [place]  Outpatient dialysis schedule: TTS    Assessment/Recommendations: Jeremy Oliver is a 49 y.o. male with past medical history notable for ESRD on HD and CAD s/p multiple interventions presenting with chest pain.     ESRD: Home Unit: [place]. Access: R-IJ catheter. Home Order: 4 hrs, 2K, 2.25Ca, 137Na, 35Bicarb, 36.5C. Dialyzer: 180. Does not receive any heparin   - Given recent hemodynamic instability and need for volume removal, will continue with CRRT and reassess post LHC procedure.     Volume/ hypertension: EDW 163kg. Will attempt to achieve dry weight if tolerated. Increased hourly UF to help volume removal so he can lay flat an tolerate LHC.    Anemia of CKD: Hgb 9.7. Receives Mircera 50mg  q2 weeks, last dose 08/06/19. Will use comparable dose of epogen and Receives iron 100mg  every treatment, starting 08/06/19 for 10 doses.     Bone-mineral disease: Ca 8.2. Not on any vitamin D analogues.     Vascular access: right IJ tunneled catheter without signs of infection    Hepatitis status: Hep B surface antigen negative on 08/02/19    Additional recommendations:  - Avoid nephrotoxic drugs; dose all meds for creatinine clearance < 10 ml/min   - Unless absolutely necessary, no MRIs with gadolinium.   - Implement save arm precautions.  Prefer needle sticks in the dorsum of the hands or wrists.  No blood pressure measurements in arm.  - If blood transfusion is requested during hemodialysis sessions, please alert [place] prior to the session.   - If a hemodialysis catheter line culture is requested, please alert [place] as only hemodialysis nurses are able to collect those specimens.     **Please contact Quin Hoop PRIOR to hospital discharge so that the nephrology team can arrange appropriate dialysis-related follow-up.**    Recommendations discussed with primary team.     Nephrology will continue to follow. Please contact the on call nephrology fellow if there are any additional questions or concerns.    Patient seen and discussed with Dr. Gwynneth Munson who is in agreement with the plan.    Armandina Stammer, MD  Division of Nephrology      HPI  Jeremy Oliver is a 49 y.o. male with PMH of ESRD on HD, CAD??s/p multiple PCIs, hx of VT after STEMI in 2018 s/p ICD,??T2DM, HTN, HLD, HFrEF (echo 09/2018 EF 35% w G1DD),??TIA, A-fib??on??Apixaban, OSA on CPAP, non-epileptic seizures, andbilateral DVTs  who presents for evaluation of chest pain that precluded him form receiving HD on Thursday. While in the ED, he was noted to have hypertension, so an Nitro drip was started. He was already scheduled for an outpatient LHC due to his recurrent CP. He also endorsed feeling SoB and was requiring 3L [place].    Per cardiology request CRRT was initiated on arrival to the CICU to facilitate volume removal so he R[place]ner lay flat and proceed with LHC in the morning. Early this morning, he had an acute hypotensive event. Nitro drip was stopped and NE was briefly used. UF was left off until recently and has been resumed.     This morning, he is resting calming but still asking to go home and intermittently restless.       Medications:   Current Facility-Administered Medications   Medication Dose Route Frequency  Provider Last Rate Last Admin   ??? amiodarone (PACERONE) tablet 200 mg  200 mg Oral Daily Sharyon Medicus, MD   200 mg at 08/19/19 1610   ??? amLODIPine (NORVASC) tablet 10 mg  10 mg Oral Daily Sharyon Medicus, MD   10 mg at 08/19/19 0923   ??? atorvastatin (LIPITOR) tablet 80 mg  80 mg Oral Daily Sharyon Medicus, MD   80 mg at 08/18/19 2327   ??? carvediloL (COREG) tablet 25 mg  25 mg Oral BID Sharyon Medicus, MD   25 mg at 08/19/19 9604   ??? dextrose 50 % in water (D50W) 50 % solution 12.5 g  12.5 g Intravenous Q10 Min PRN Blaine Hamper, MD       ??? ezetimibe (ZETIA) tablet 10 mg  10 mg Oral Nightly Sharyon Medicus, MD   10 mg at 08/18/19 2327   ??? FLUoxetine (PROzac) capsule 80 mg  80 mg Oral Nightly Blaine Hamper, MD       ??? gabapentin (NEURONTIN) capsule 300 mg  300 mg Oral BID Sharyon Medicus, MD   300 mg at 08/19/19 5409   ??? heparin (porcine) 1,000 unit/mL 1000 unit/mL injection 2,000 Units  2,000 Units Intravenous Q6H PRN Sharyon Medicus, MD   2,000 Units at 08/19/19 1032   ??? heparin (porcine) 1,000 unit/mL 1000 unit/mL injection 2,200 Units  2.2 mL Intra-cannular Each time in dialysis PRN Armandina Stammer, MD       ??? heparin (porcine) 1,000 unit/mL 1000 unit/mL injection 2,300 Units  2.3 mL Intra-cannular Each time in dialysis PRN Armandina Stammer, MD       ??? heparin 25,000 Units/250 mL (100 units/mL) in 0.45% saline infusion (premade)  12 Units/kg/hr Intravenous Continuous Sharyon Medicus, MD 20.02 mL/hr at 08/19/19 1000 12 Units/kg/hr at 08/19/19 1000   ??? insulin lispro (HumaLOG) injection 0-12 Units  0-12 Units Subcutaneous ACHS Blaine Hamper, MD       ??? insulin NPH (HumuLIN,NovoLIN) injection 13 Units  0.15 Units/kg/day Subcutaneous Q12H [facility] Sharyon Medicus, MD   13 Units at 08/19/19 0956   ??? [START ON 08/21/2019] isosorbide mononitrate (IMDUR) 24 hr tablet 30 mg  30 mg Oral Every Other Day Sharyon Medicus, MD       ??? nitroGLYCERIN 50 mg in dextrose 5% 250 mL (200 mcg/mL) infusion PMB  0-300 mcg/min Intravenous Continuous Sharyon Medicus, MD   Paused at 08/19/19 0115   ??? NxStage RFP 400 (+/- BB) 5000 mL - contains 2 mEq/L of potassium dialysis solution 5,000 mL  5,000 mL CRRT Continuous Armandina Stammer, MD       ??? NxStage RFP 401 (+/- BB) 5000 mL - contains 4 mEq/L of potassium dialysis solution 5,000 mL  5,000 mL CRRT Continuous Armandina Stammer, MD   5,000 mL at 08/19/19 0904   ??? pantoprazole (PROTONIX) EC tablet 40 mg  40 mg Oral Daily Sharyon Medicus, MD   40 mg at 08/19/19 8119   ??? sevelamer (RENVELA) tablet 800 mg  800 mg Oral 3xd Meals Sharyon Medicus, MD   800 mg at 08/19/19 1478   ??? ticagrelor (BRILINTA) tablet 90 mg  90 mg Oral BID Tamera Reason, MD   90 mg at 08/19/19 0948        ALLERGIES  Losartan and Opioids - morphine analogues    MEDICAL HISTORY  Past Medical History:   Diagnosis Date   ??? Angina at rest (CMS-[name])    ???  Arthritis    ??? Asthma    ??? Atrial fibrillation and flutter (CMS-[name]) 06/08/14   ??? BMI 40.0-44.9, adult (CMS-[name]) 10/29/2015   ??? Chronic anticoagulation 02/17/2018   ??? Chronic pain syndrome 04/11/2014    ~Use daily lyrica as recommended ~Tramadol as needed for pain ~Fluoxetine daily as recommended ~Daily exercise ~Eat a healthy diet ~Good control of your blood sugars can help    ??? Coronary artery disease    ??? Depression    ??? Diabetes mellitus (CMS-[name])    ??? ESRD (end stage renal disease) on dialysis (CMS-[name])    ??? Eye trauma 1998    glass in both eyes and removed   ??? GERD (gastroesophageal reflux disease)    ??? Gout    ??? Homeless    ??? Hyperlipidemia    ??? Hypertension    ??? Illiteracy and low-level literacy    ??? Microscopic hematuria    ??? Migraine    ??? Nephrolithiasis    ??? 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 Nonalcoholic fatty liver disease    ??? NSTEMI (non-ST elevated myocardial infarction) (CMS-[name]) 01/13/2017   ??? Obesity    ??? Obstructive sleep apnea 2009   ??? Panic attacks    ??? 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 Seizure-like activity (CMS-[name])     ~Monitor for symptoms and report them to your primary care provider if they occur    ??? Systolic CHF, chronic (CMS-[name])     EF 40-45% 2017   ??? TIA (transient ischemic attack)         SOCIAL HISTORY Social History     Socioeconomic History   ??? Marital status: Married     Spouse name: [name]   ??? Number of children: 3   ??? Years of education: 9   ??? Highest education level: Not on file   Occupational History   ??? Occupation: DISABLED     Comment: Former IT sales professional   Tobacco Use   ??? Smoking status: Never Smoker   ??? Smokeless tobacco: Current User     Types: Chew   ??? Tobacco comment: 08/15/19:  Dips 1x a day./ /Pt in process of reducing tobacco use,  dips 1 can every 3 weeks,   Vaping Use   ??? Vaping Use: Never used   Substance and Sexual Activity   ??? Alcohol use: Never     Alcohol/week: 0.0 standard drinks     Comment: rare use: couple times a year, small amount   ??? Drug use: No   ??? Sexual activity: Not Currently     Partners: Female   Other Topics Concern   ??? Do you use sunscreen? No   ??? Tanning bed use? No   ??? Are you easily burned? No   ??? Excessive sun exposure? No   ??? Blistering sunburns? No   Social History Narrative    Information updated:  01/08/16. Reviewed last: 12/01/18, 08/15/19        Born in [place]    Raised with both parents    Parents died w/in 6 mos of each  other. His parents knew they were going to pass and told him just before.    Half sister and 2 half brothers        Married 31 yrs. Wife [name]    2 other children son and daughter, adopted out    Daughter Bridgette, bf DJ        Education - completed through 11th. 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    Former Presenter, broadcasting since age 77    Lives with wife, difficulty paying bills 12/30/17     Lives next door to daughter, husband and their grandson        05/19/18: pt moving to new home in next 5 days- after an eviction. Has Medicaid assistance again. Will be w/ wife [name], they babysit their grandson often.    06/14/18: Pt and husband living with daughter while new location to live is determined.    12/01/18: pt living with wife and friends. Friends have two young children. [name] states soon they will have their own place, however doesn't know when. Pt states it will be after the neighbors are evicted, we can move in. Pt will need to start dialysis soon. Pt and wife Not talking with daughter and grandson lately.      Social Determinants of Health     Financial Resource Strain: Low Risk    ??? Difficulty of Paying Living Expenses: Not hard at all   Food Insecurity: No Food Insecurity   ??? Worried About Programme researcher, broadcasting/film/video in the Last Year: Never true   ??? Ran Out of Food in the Last Year: Never true   Transportation Needs: No Transportation Needs   ??? Lack of Transportation (Medical): No   ??? Lack of Transportation (Non-Medical): No   Physical Activity:    ??? Days of Exercise per Week:    ??? Minutes of Exercise per Session:    Stress: Stress Concern Present   ??? Feeling of Stress : Rather much   Social Connections: Moderately Isolated   ??? Frequency of Communication with Friends and Family: More than three times a week   ??? Frequency of Social Gatherings with Friends and Family: Twice a week   ??? Attends Religious Services: Never   ??? Active Member of Clubs or Organizations: No   ??? Attends Banker Meetings: Never   ??? Marital Status: Married        FAMILY HISTORY  Family History   Problem Relation Age of Onset   ??? Diabetes Mother    ??? Hyperlipidemia Mother    ??? Heart disease Mother    ??? Basal cell carcinoma Mother    ??? Blindness Mother         diabeties   ??? Obesity Mother    ??? Hyperlipidemia Father    ??? Heart disease Father    ??? Lung disease Father    ??? Heart disease Half-Sister    ??? Kidney disease Half-Sister    ??? Gallbladder disease Half-Brother    ??? Obesity Half-Brother    ??? No Known Problems Daughter    ??? No Known Problems Son    ??? Obesity Daughter    ??? Depression Maternal Uncle         Died by suicide in 2017/09/19 by gunshot   ???  Melanoma Neg Hx    ??? Squamous cell carcinoma Neg Hx    ??? Macular degeneration Neg Hx    ??? Glaucoma Neg Hx         Review of Systems:  A 12 system review of systems was negative except as noted in HPI.    Physical Exam:  Vitals:    08/19/19 1000   BP:    Pulse: 70   Resp: 16   Temp:    SpO2: 99%     I/O this shift:  In: 155.1 [P.O.:75; I.V.:80.1]  Out: 787 [Urine:500; Other:287]    Intake/Output Summary (Last 24 hours) at 08/19/2019 1054  Last data filed at 08/19/2019 1000  Gross per 24 hour   Intake 590.37 ml   Output 1001 ml   Net -410.63 ml     General: Chronically ill appearing, NAD  HEENT: EOMI, PERRL, MMM, no appreciable LAD  CV: regular rate  Chest: Mild increased work of breathing  Abdomen: Rotund, soft, non-tender  Extremities: Trace to 1+ peripheral edema or deformities  Neuro: AAOx3, CN II-XII grossly intact, non-focal     Test Results  Reviewed  Lab Results   Component Value Date    NA 135 08/19/2019    NA 137 08/19/2019    K 4.6 08/19/2019    K 4.8 08/19/2019    CL 108 (H) 08/19/2019    CL 106 08/19/2019    CO2 27.0 08/19/2019    CO2 25.0 08/19/2019    BUN 25 (H) 08/19/2019    BUN 26 (H) 08/19/2019    CREATININE 2.98 (H) 08/19/2019    CREATININE 2.89 (H) 08/19/2019    GFR >= 60 01/09/2012    GLU 176 (H) 08/19/2019    GLU 177 (H) 08/19/2019    CALCIUM 8.2 (L) 08/19/2019    CALCIUM 8.3 (L) 08/19/2019    ALBUMIN 2.6 (L) 08/18/2019    PHOS 3.5 08/19/2019

## 2019-08-19 NOTE — Unmapped (Signed)
Cardiac Catheterization Laboratory  [facility], [place]  Tel: [phone]   Fax: [phone]    CARDIAC CATHETERIZATION REPORT    Date of Procedure: 08/19/2019  ____________________________________________________________________________  Findings:  1. There is Significant 2-vessel coronary artery disease.  There is a 90% proximal LAD and 80% proximal LCx stenosis.  Patent stents in the RCA with moderate diffuse distal disease  2. Elevated left ventricular filling pressures (LVEDP = 24 mm Hg).  3. Successful PCI to the LAD and LCx with the placement of a Synergy DES x2 with excellent angiographic result and TIMI 3 flow.  4. Rescue balloon angioplasty of the DIAG2 vessel that was occluded after dilation of the LAD stent    Recommendations:  1. Dual antiplatelet therapy with aspirin and ticagrelor for at least 12 months, ideally longer.  2. Cardiac rehab referral was placed and discussed with patient.  Patient was [name] program    Complications:  ?? None  ____________________________________________________________________________  Referring Physician: Dr. Harland German  Primary Cardiologist: Dr. Andrey Farmer  Primary Care Physician: Kurtis Bushman, MD     Performing Attending: Dr. Andrey Farmer   Diagnostic Fellow: Abundio Miu MD  Interventional Fellow: Jaquita Rector, MD    Procedures performed:   Left Heart Catheterization, Left Ventriculogram, & Coronary Angiography  Percutaneous Coronary Intervention (PCI)    Arterial Access Site: Left Radial Artery  Arterial Closure: TR Band  Contrast Volume: 200 mL  Fluoroscopy Time: 16.6 mins  Radiation Dose: 4281 mGy    ____________________________________________________________________________  History  Jeremy Oliver is a 49 y.o. male admitted referred for coronary angiogram and possible percutaneous coronary intervention     Indication for Procedure  Presentation:  (STEMI, NSTEMI, Unstable angina, Stable angina): NSTEMI  Angina class:  (Class I-IV):  IV  Pre-procedure non-invasive study:  (low, intermediate, high risk): None  FFR (if performed): None    Clinical Predictors:  Cardiogenic shock prior to procedure:No  NYHA functional class :4  Procedure Status: Urgent  Dialysis: Yes  Cardiac arrrest prior to PCI: No  CHSA Frailty Score:  6: Moderately Frail  Syntax Score:  Not Performed      Percutaneous Coronary Intervention  The decision was made to intervene on the Proximal LAD and Proximal LCx.    PCI to LAD    ?? Anticoagulation: Heparin   ?? Antiplatelet: Ticagrelor   ?? Guide: 68F [place] L 3.5   ?? Guidewire(s): Runthrough   ?? Intracoronary imaging: None      ?? Pre-dilation balloon(s): [place]apphire 2.25 x 15 mm    ?? Stent(s): Synergy 2.75 x 20 mm DES   ?? Post-dilation balloon(s): [place] Trek 3.0 x 15 mm    ?? Result: Excellent angiographic result with TIMI 3 flow.      PCI to LCx    ?? Anticoagulation: Heparin   ?? Antiplatelet: Ticagrelor   ?? Guide: 68F Ikari L 3.5   ?? Guidewire(s): [place]   ?? Intracoronary imaging: None      ?? Pre-dilation balloon(s): 2.25 x 15 mm Sapphire   ?? Stent(s): Synergy 2.75 x 16 mm DES   ?? Post-dilation balloon(s): 3.0 x 15 mm [place] Trek   ?? Result: Excellent angiographic result with TIMI 3 flow.      Jaquita Rector MD  Interventional Cardiology Fellow    ____________________________________________________________________________  I was present for the entire duration of the procedure, as detailed above  Hoy Finlay, MD

## 2019-08-19 NOTE — Unmapped (Signed)
[facility] Nephrology Continuous Renal Replacement Therapy Procedure Note     08/19/2019    Jeremy Oliver was seen and examined on CRRT    CHIEF COMPLAINT: End Stage Renal Disease    INTERVAL HISTORY: Transiently hypotensive overnight. Minimal UF overnight. Still on 3L [place].    CURRENT DIALYSIS PRESCRIPTION:  Device: CRRT Device: NxStage  Therapy fluid: Therapy Fluid : NxStage RFP 401 - Contains 4 mEq/L KCL  Therapy fluid rate: Therapy Fluid Rate (L/hr): 5 L/hr  Blood flow rate: Blood Pump Rate (mL/min): 300 mL/min  Fluid removal rate: Hourly Fluid Removal Rate (mL/hr): 450 mL/hr    PHYSICAL EXAM:  Vitals:  Temp:  [36.8 ??C-37 ??C] 37 ??C  Heart Rate:  [70-99] 70  SpO2 Pulse:  [70-91] 70  BP: (127-181)/(61-98) 127/61  MAP (mmHg):  [88-118] 88  MAP:  [57 mmHg-108 mmHg] 87 mmHg  A BP-2: (80-162)/(47-86) 127/67  MAP:  [57 mmHg-108 mmHg] 87 mmHg    In/Outs:    Intake/Output Summary (Last 24 hours) at 08/19/2019 1120  Last data filed at 08/19/2019 1000  Gross per 24 hour   Intake 590.37 ml   Output 1339 ml   Net -748.63 ml        Weights:  Admission Weight: (!) 166.8 kg (367 lb 12.8 oz)  Last documented Weight: (!) 166.8 kg (367 lb 12.8 oz)  Weight Change from Previous Day: No weight listed for specified days    Assessment:   General: Appearing chronically ill appearing, mildly restless  Pulmonary: increased respiratory effort   Cardiovascular: regular rate and rhythm  Extremities: 1+  edema  Access: Right IJ non-tunneled catheter     LAB DATA:  Lab Results   Component Value Date    NA 135 08/19/2019    NA 137 08/19/2019    K 4.6 08/19/2019    K 4.8 08/19/2019    CL 108 (H) 08/19/2019    CL 106 08/19/2019    CO2 27.0 08/19/2019    CO2 25.0 08/19/2019    BUN 25 (H) 08/19/2019    BUN 26 (H) 08/19/2019    CREATININE 2.98 (H) 08/19/2019    CREATININE 2.89 (H) 08/19/2019    CALCIUM 8.2 (L) 08/19/2019    CALCIUM 8.3 (L) 08/19/2019    MG 1.7 08/19/2019    PHOS 3.5 08/19/2019    ALBUMIN 2.6 (L) 08/18/2019      Lab Results   Component Value Date    HCT 31.1 (L) 08/19/2019    HGB 9.7 (L) 08/19/2019    WBC 7.7 08/19/2019        ASSESSMENT/PLAN:  End Stage Renal Disease on Continuous Renal Replacement Therapy:  - UF goal: 350 - 457mL/hr as tolerated. Increased DFR given planned PIRRT.   - Renally dose all medications    Armandina Stammer, MD  [facility] Division of Nephrology & Hypertension

## 2019-08-19 NOTE — Unmapped (Cosign Needed)
CICU   Daily Progress Note     Date of Service: 08/19/2019    Problem List:   Principal Problem:    Angina pectoris (CMS-[name])  Resolved Problems:    * No resolved hospital problems. *    Interval Events/Subjective: weaned off nitroglycerin drip as chest pain resolved, CRRT for fluid removal with stable blood pressures, down to cath lab this afternoon after more fluid removal with CRRT    Assessment/Plan: Jeremy Oliver is a 49 y.o. male with PMH significant for ESRD on dialysis (T/TH/S), CAD??s/p multiple PCIs, hx of VT after STEMI in 2018 s/p ICD,??T2DM, HTN, HLD, HFrEF (echo 09/2018 EF 35% w G1DD),??TIA, A-fib??on??Apixaban, OSA on CPAP, non-epileptic seizures, bilateral DVTs presenting??after an episode of a substernal chest pain with associated shortness of breath on 08/18/19.     Neurological   Depression:   - Continue prozac 80 mg nightly  ??  Non-epileptic seizures: Chronic for years. Intermittent altered levels of consciousness previously evaluated by Neurology. Prior EEG without epileptiform activity. No further neurological workup recommended.   - CTM    Pulmonary   Hypoxic respiratory failure: Initially non-hypoxic on room air on arrival to ED, however reportedly had desaturations with elevated BP concerning for flash pulmonary edema. CXR on arrival with mild pulmonary vascular congestion and peribronchial cuffing. Denies cough, fever, worsening HF symptoms. Flu/RSV/COVID swab negative. Currently without fevers, leukocytosis, or consolidations, low concern for infectious etiology.  - Treat hypertension and HFrEF as below  - Wean O2 for SpO2 at least 92%  ??  OSA:   - Home BiPAP nightly    Cardiovascular   Multi-vessel CAD s/p multiple PCI  History of extensive CAD with multiple stents. Had an STEMI in 2018 with VT arrest (s/p ICD) with DES to mLAD. Presented with NSTEMI on 03/12/2018 and had 2x DES placed in??the mid RCA and RPLA/RPDA bifurcation.??NSTEMI in 05/2018 w/??PCI to proximal/mid LAD.??Another NSTEMI 09/2018 w/ PCI to distal RCA. Patient was scheduled for [facility] for exertional angina with Dr. Andrey Farmer, however was instructed to present to ED due to chest pain prior to dialysis today. Hypertensive and normocardic on admission. Troponin negative initially then slightly elevated during hypertensive emergency episode (see below) with peak at 0.065. ECG normal sinus, T wave inversions in aVL stable, poor R wave progression. Nitroglycerin drip weaned off.   - CRRT to optimize volume status prior to cath  - LHC this afternoon after fluid removal  - Continue ticagrelor 90 BID  - Continue atorvastatin, zetia  - Heparin gtt ACS nomogram until cath  - Aspirin 325 mg x1  ??  Hypertensive emergency: Placed on nitroglycerin gtt in ED for SBP ~180 and oxygen desaturations concerning for flash pulmonary edema. Currently saturating well on [facility]. Mild bibasilar crackles on exam. Will restart home antihypertensives and titrate nitroglycerin gtt to chest pain severity. Nitroglycerin drip weaned off.   - MAP goal 70  - Continue carvedilol 25 mg BID  - Continue imdur 30 mg on non-dialysis days  - Continue amlodipine 10 mg daily  ??  HFrEF (EF 35% w GIDD): Last echocardiogram 09/2018 EF 35%, severe posterolateral hypokinesis, GIDD. Pro-BNP 7270, elevated from average baseline of ~4-5k. States dry weight is 166 kg. Denies weight changes, leg swelling, progressive shortness of breath, PND or worsening orthopnea. Nitroglycerin drip weaned off.   - Echocardiogram  - Fluid removal with CRRT  - Continue carvedilol 25 mg BID  - Continue imdur on non-dialysis days  - Daily weights, strict I/O  Paroxysmal Atrial fibrillation: Currently sinus rhythm.  - Continue amiodarone 200 mg daily  - Heparin gtt as above   - Hold eliquis until after cath  ??  HLD: Last lipid panel 04/14/2019 cholesterol 213, TG 185,, HDL 39, LDL 137.   - Continue atorvastatin 80 mg daily  - Continue zetia  - Continue praluent injections q14 days, next due 5/22    Renal   ESRD on HD T/Th/S: Via R internal jugular tunneled line.   - Nephrology consulted  - CRRT until cath then consider restart iHD tomorrow  - Continue sevelamer 800 mg TID  - CRRT labs    Infectious Disease/Autoimmune   NAI    FEN/GI   Home PPI continued    Heme/Coag   Normocytic anemia: Hgb 10.4, MCV low normal at 81.3. iron panel checked in April showed low iron but normal ferritin, likely anemia of chronic disease. Denies bleeding.   - Daily CBC    Endocrine   T2DM: Hemoglobin A1c 7.2% 04/14/2019. Home regimen includes semaglutide, levemir 37 units nightly and aspart 15 TID per chart however patient unable to confirm.   - SSI  - Hold levemir and aspart  - NPH 13 units BID  - Hold semaglutide  - Continue gabapentin    Prophylaxis/LDA/Restraints/Consults   Can CVC be removed? No: tunneled catheter   Can A-line be removed? No: inadequate non-invasive pressure monitoring  Can Foley be removed? N/A, no Foley present  Mobility plan: Step 1 - Range of motion    Feeding: NPO for procedure  Analgesia: No pain issues  Sedation SAT/SBT: N/A  Thrombembolic ppx: Fully anticoagulated  Head of bed >30 degrees: Yes  Ulcer ppx: Yes, home use continued  Glucose within target range: Yes, in range    RASS at goal? N/A, not on sedation  [place] Agitation Assessment Scale (RASS) : 0 (08/19/2019 12:00 PM)     Can antipsychotics be stopped? N/A, not on antipsychotics  CAM-ICU Result: Negative (08/19/2019  8:00 AM)      Patient Lines/Drains/Airways Status    Active Active Lines, Drains, & Airways     Name:   Placement date:   Placement time:   Site:   Days:    Peripheral IV 08/18/19 Right Antecubital   08/18/19    1200    Antecubital   1    Peripheral IV 08/19/19 Anterior;Left Forearm   08/19/19    0127    Forearm   less than 1    Arterial Line 08/18/19 Left Radial   08/18/19    2300    Radial   less than 1    Hemodialysis Catheter 07/27/19 Right Internal jugular 2.2 mL 2.3 mL   07/27/19    1018    Internal jugular   23              Patient Lines/Drains/Airways Status    Active Wounds     None                Goals of Care     Code Status: Full Code    Designated Healthcare Decision Maker:  Jeremy Oliver currently has decisional capacity for healthcare decision-making and is able to designate a surrogate healthcare decision maker. Jeremy Oliver designated healthcare decision maker is Jeremy Oliver (the patient's spouse) as denoted by stated patient preference.    Subjective     See above for subjective and interval events.    Objective     Vitals - past 24  hours  Temp:  [36.5 ??C-37 ??C] 36.5 ??C  Heart Rate:  [70-99] 70  SpO2 Pulse:  [70-91] 70  Resp:  [9-23] 17  BP: (127-181)/(61-94) 127/61  FiO2 (%):  [40 %] 40 %  SpO2:  [87 %-100 %] 98 % Intake/Output  I/O last 3 completed shifts:  In: 435.3 [P.O.:120; I.V.:315.3]  Out: 214 [Other:214]     Physical Exam:    GEN: NAD  HENT: MMM, oropharynx clear w/o erythema or ulcerations   EYES: no scleral icterus or conjunctival injection, PERRLA, EOMI  NECK: no thyromegaly, flat neck veins in semi-recumbent position   CV: normal rate, regular rhythm, no murmurs heard  LUNGS: clear lung fields, normal WOB  ABD: soft, NT/ND  EXT: no peripheral edema, distal pulses intact  NEURO: alert, oriented x3, no FNDs  SKIN: no abrasions, erythema, or rashes appreciated on exposed skin     Continuous Infusions:   ??? heparin Stopped (08/19/19 1337)   ??? NxStage RFP 400 (+/- BB) 5000 mL - contains 2 mEq/L of potassium     ??? NxStage RFP 401 (+/- BB) 5000 mL - contains 4 mEq/L of potassium         Scheduled Medications:   ??? heparin (porcine) 1,000 unit/mL       ??? midazolam (PF)       ??? amiodarone  200 mg Oral Daily   ??? amLODIPine  10 mg Oral Daily   ??? atorvastatin  80 mg Oral Daily   ??? carvediloL  25 mg Oral BID   ??? ezetimibe  10 mg Oral Nightly   ??? fentaNYL (PF)       ??? FLUoxetine  80 mg Oral Nightly   ??? gabapentin  300 mg Oral BID   ??? heparin (porcine) 1,000 unit/mL       ??? insulin lispro  0-12 Units Subcutaneous ACHS   ??? insulin NPH  0.15 Units/kg/day Subcutaneous Q12H [facility]   ??? lidocaine (PF)       ??? pantoprazole  40 mg Oral Daily   ??? sevelamer  800 mg Oral 3xd Meals   ??? ticagrelor  90 mg Oral BID   ??? verapamiL           PRN medications:  dextrose 50 % in water (D50W), fentaNYL (PF), heparin (porcine) 1,000 unit/mL, heparin (porcine) 1,000 unit/mL, heparin (porcine) 1,000 unit/mL, heparin (porcine) 1,000 unit/mL, lidocaine, midazolam, verapamiL    Data/Imaging Review: Reviewed in Epic and personally interpreted on 08/19/2019. See EMR for detailed results.

## 2019-08-19 NOTE — Unmapped (Cosign Needed)
CICU History & Physical     Date of Service: 08/18/2019    Problem List:   Active Problems:    * No active hospital problems. *  Resolved Problems:    * No resolved hospital problems. *      Assessment/Plan: Jeremy Oliver is a 49 y.o. male with a past medical history significant for ESRD on dialysis (T/TH/S), CAD s/p multiple PCIs, hx of VT after STEMI in 2018 s/p ICD, T2DM, HTN, HLD, HFrEF (echo 09/2018 EF 35% w G1DD), TIA, A-fib??on??Apixaban, OSA on CPAP, non-epileptic seizures, bilateral DVTs presenting after an episode of a substernal chest pain with associated shortness of breath when waking up this morning.     Neurological   Depression:   - Continue prozac 80 mg daily    Non-epileptic seizures: Intermittent altered levels of consciousness previously evaluated by Neurology. Prior EEG without epileptiform activity. No further neurological workup recommended.   - CTM    Pulmonary   Hypoxic respiratory failure: Initially non-hypoxic on room air on arrival to ED today, however reportedly had desaturations with elevated BP concerning for flash pulmonary edema. CXR on arrival with mild pulmonary vascular congestion and peribronchial cuffing. Denies cough, fever, worsening HF symptoms. Flu/RSV/COVID swab negative. Currently without fevers, leukocytosis, or consolidations, low concern for infectious etiology.  - Treat hypertension and HFrEF as below  - Wean O2 for SpO2 at least 92%    OSA:   - Home BiPAP nightly    Cardiovascular     Multi-vessel CAD s/p multiple PCI  History of extensive CAD with multiple stents. Had an STEMI in 2018 with VT arrest (s/p ICD) with DES to mLAD. Presented with NSTEMI on 03/12/2018 and had 2x DES placed in the mid RCA and RPLA/RPDA bifurcation. NSTEMI in 05/2018 w/ PCI to proximal/mid LAD. Another NSTEMI 09/2018 w/ PCI to distal RCA. Patient was scheduled for [facility] tomorrow for exertional angina with Dr. Andrey Farmer, however was instructed to present to ED due to chest pain prior to dialysis today. Hypertensive and normocardic today. Troponin negative. ECG normal sinus, T wave inversions in aVL stable, poor R wave progression.   - Nitroglycerin gtt, titrate to chest pain  - Cycle troponins, serial ECG   - CRRT overnight to optimize volume status prior to cath  - NPO at MN  - LHC tomorrow  - Continue ticagrelor 90 BID  - Ticagrelor load prior to procedure  - Continue atorvastatin, zetia  - Heparin gtt ACS nomogram  - Aspirin 325 mg x1    Hypertensive emergency: Placed on nitroglycerin gtt in ED for SBP ~180 and oxygen desaturations concerning for flash pulmonary edema. Currently saturating well on [facility]. Mild bibasilar crackles on exam. Will restart home antihypertensives and titrate nitroglycerin gtt to chest pain severity.   - Continue nitroglycerin gtt  - MAP goal 70  - Continue carvedilol 25 mg BID  - Continue imdur 30 mg on non-dialysis days  - Continue amlodipine 10 mg daily    HFrEF (EF 35% w GIDD): Last echocardiogram 09/2018 EF 35%, severe posterolateral hypokinesis, GIDD. Pro-BNP 7270, elevated from average baseline of ~4-5k. States dry weight is 166 kg. Denies weight changes, leg swelling, progressive shortness of breath, PND or worsening orthopnea.   - Echocardiogram  - Fluid removal with CRRT  - Continue carvedilol 25 mg BID  - Continue imdur on non-dialysis days  - Daily weights, strict I/O     Paroxysmal Atrial fibrillation: Currently sinus rhythm.  - Continue amiodarone 200 mg daily  -  Heparin gtt as above   - Hold eliquis    HLD: Last lipid panel 04/14/2019 cholesterol 213, TG 185,, HDL 39, LDL 137.   - Continue atorvastatin 80 mg daily  - Continue zetia  - Continue praluent injections q14 days, next due 5/22    Renal   ESRD on HD T/Th/S: Via R internal jugular tunneled line.   - Nephrology consulted  - Start CRRT tonight  - Continue sevelamer 800 mg TID  - CRRT labs      Infectious Disease/Autoimmune   NAI    FEN/GI   NAI    Heme/Coag   Normocytic anemia: Hgb 10.4, MCV low normal at 81.3. iron panel checked in April showed low iron but normal ferritin, likely anemia of chronic disease. Denies bleeding.   - Daily CBC    Endocrine   T2DM: Hemoglobin A1c 7.2% 04/14/2019. Home regimen includes semaglutide, levemir 37 units nightly and aspart 15 TID per chart however patient unable to confirm.   - SSI  - Hold levemir and aspart  - NPH 13 units BID  - Hold semaglutide  - Continue gabapentin    Prophylaxis/LDA/Restraints/Consults   Can CVC be removed? N/A, no CVC present (including vascular catheter for HD or PLEX)   Can A-line be removed? No: inadequate non-invasive pressure monitoring  Can Foley be removed? N/A, no Foley present  Mobility plan: Step 1 - Range of motion    Feeding: NPO for procedure  Analgesia: No pain issues  Sedation SAT/SBT: N/A  Thrombembolic ppx: Fully anticoagulated  Head of bed >30 degrees: Yes  Ulcer ppx: Home PPI  Glucose within target range: Yes, in range    RASS at goal? Yes  [place] Agitation Assessment Scale (RASS) : 0 (07/29/2019  9:00 PM)     Can antipsychotics be stopped? N/A, not on antipsychotics  CAM-ICU Result: Negative (10/19/2018  8:00 AM)      Patient Lines/Drains/Airways Status    Active Active Lines, Drains, & Airways     Name:   Placement date:   Placement time:   Site:   Days:    Peripheral IV 08/18/19 Right Antecubital   08/18/19    1200    Antecubital   less than 1    Hemodialysis Catheter 07/27/19 Right Internal jugular 2.2 mL 2.3 mL   07/27/19    1018    Internal jugular   22              Patient Lines/Drains/Airways Status    Active Wounds     None                Goals of Care     Code Status: Full Code    Designated Healthcare Decision Maker:  Mr. Mcglaughlin currently has decisional capacity for healthcare decision-making and is able to designate a surrogate healthcare decision maker. [name] designated healthcare decision maker is [name] Linz (the patient's spouse) as denoted by stated patient preference.    Subjective     HPI: Jeremy Route Leland is a 49 y.o. male with a past medical history significant for ESRD on dialysis (T/TH/S), CAD s/p multiple PCIs, hx of VT after STEMI in 2018 s/p ICD, T2DM, HTN, HLD, HFrEF (echo 09/2018 EF 35% w G1DD), TIA, A-fib??on??Apixaban, OSA on CPAP, non-epileptic seizures, bilateral DVTs presenting after an episode of a substernal chest pain with associated shortness of breath when waking up this morning.     Patient reports having exertional angina over the past several  weeks for which he is scheduled to have a LHC tomorrow 08/19/19.  He was in his usual state of health until waking up this morning with heavy, substernal severe chest pain. He felt like he was having a panic attack. He had associated lightheadedness and shortness of breath as well as left arm numbness. Denies palpations, diaphoresis, nausea. He reports adherence to all of his medications. No recent medication changes. He denies recent increase in leg swelling, weight changes, progressive shortness of breath, worsening orthopnea (two pillows at baseline) or PND. Endorses improvement in weight and leg swelling following initiation of dialysis approximately one month ago. No recently missed sessions. Tolerates dialysis well.     In the ED, he was initially afebrile, HR 100, BP 178/93, saturating 97% on RA. Labs notable for WBC 10.4, Hgb 10.4, plt 271, Na 136, K 4.2, BUN 30, glucose 184, albumin 2.6. troponin negative x1. Pro BNP 7270. ECG normal sinus without ischemic changes, stable T wave inversions aVL, poor R wave progression. CXR with mild pulmonary vascular congestion. Patient reportedly desaturated and was placed on 3LNC. He was placed on a nitroglycerin gtt due to concern for hypertensive emergency.     ROS: Ten-point review of systems is obtained and is negative except as noted in HPI.    Allergies:  Allergies   Allergen Reactions   ??? Losartan      Hyperkalemia (K>6)   ??? Opioids - Morphine Analogues Anxiety       Meds:  No current facility-administered medications on file prior to encounter.     Current Outpatient Medications on File Prior to Encounter   Medication Sig   ??? amLODIPine (NORVASC) 10 MG tablet Take 10 mg by mouth daily.   ??? acetaminophen (TYLENOL) 500 MG tablet TAKE 2 TABLETS (1 000MG ) BY MOUTH EVERY 8 HOURS AS NEEDED FOR PAIN   ??? albuterol HFA 90 mcg/actuation inhaler Inhale 2 puffs into the lungs every 6 (six) hours as needed for wheezing or shortness of breath.   ??? alirocumab (PRALUENT) 150 mg/mL subcutaneous injection Inject the contents of 1 pen (150 mg total) under the skin every fourteen (14) days.   ??? allopurinoL (ZYLOPRIM) 100 MG tablet Take 2 tablets (200 mg total) by mouth daily.   ??? amiodarone (PACERONE) 200 MG tablet TAKE 1 TABLET BY MOUTH ONCE DAILY   ??? apixaban (ELIQUIS) 5 mg Tab Take 1 tablet (5 mg total) by mouth Two (2) times a day.   ??? atorvastatin (LIPITOR) 80 MG tablet Take 1 tablet (80 mg total) by mouth daily.   ??? blood sugar diagnostic Strp Disp to match current ins approved meter. Use to test blood sugar four times daily. E11.22, Z79.4   ??? blood-glucose meter kit Disp. blood glucose meter kit preferred by patient's insurance. Dx: Diabetes, E11.9   ??? carvediloL (COREG) 25 MG tablet Take 1 tablet (25 mg total) by mouth Two (2) times a day. HOLD morning dose on dialysis days   ??? cholecalciferol, vitamin D3 25 mcg, 1,000 units,, 1,000 unit (25 mcg) tablet Take 1 tablet (25 mcg total) by mouth daily.   ??? diclofenac sodium (VOLTAREN) 1 % gel Apply 2 grams topically Three (3) times a day.   ??? ezetimibe (ZETIA) 10 mg tablet TAKE 1 TABLET BY MOUTH ONCE DAILY   ??? FLUoxetine (PROZAC) 40 MG capsule Take 2 capsules (80 mg total) by mouth daily.   ??? gabapentin (NEURONTIN) 300 MG capsule Take 1 capsule (300 mg total) by mouth two (2) times  a day.   ??? hydrOXYzine (ATARAX) 10 MG tablet Take 1 tablet (10 mg total) by mouth nightly as needed for anxiety.   ??? insulin ASPART (NOVOLOG FLEXPEN U-100 INSULIN) 100 unit/mL (3 mL) injection pen Inject 15 units under skin three times a day with meals   ??? insulin detemir U-100 (LEVEMIR FLEXTOUCH U-100 INSULN) 100 unit/mL (3 mL) injection pen Inject 37 units total under the skin nightly   ??? insulin syringe-needle U-100 0.3 mL 31 gauge x 5/16 Syrg Use to inject insulin twice daily with meals   ??? isosorbide mononitrate (IMDUR) 30 MG 24 hr tablet Take 1 tablet (30 mg total) by mouth daily. DO NOT TAKE on dialysis days   ??? lancets Misc Disp. Ins preferred lancets. Use to test blood sugar four times daily. E11.22, Z79.4   ??? loperamide (IMODIUM) 2 mg capsule Take 1 capsule (2 mg total) by mouth 4 (four) times a day as needed for diarrhea.   ??? miscellaneous medical supply (BLOOD PRESSURE CUFF) Misc Measure BP once a day or if any symptoms   ??? nicotine polacrilex (NICORETTE) 4 MG gum Apply 1 each (4 mg total) to cheek every hour as needed for smoking cessation.   ??? nitroglycerin (NITROSTAT) 0.4 MG SL tablet Place 1 tablet (0.4 mg total) under the tongue every five (5) minutes as needed for chest pain.   ??? omeprazole (PRILOSEC) 40 MG capsule Take 1 capsule (40 mg total) by mouth Two (2) times a day (30 minutes before a meal).   ??? ondansetron (ZOFRAN) 4 MG tablet Take 1 tablet (4 mg total) by mouth every eight (8) hours as needed for nausea for up to 4 days.   ??? semaglutide 0.25 mg or 0.5 mg(2 mg/1.5 mL) PnIj Inject 0.5 mg under the skin every seven (7) days.   ??? sevelamer (RENVELA) 800 mg tablet TAKE 2 TABLETS (1600MG ) BY MOUTH THREE TIMES DAILY WITH MEALS   ??? ticagrelor (BRILINTA) 90 mg Tab TAKE 1 TABLET BY MOUTH TWICE DAILY   ??? UNIFINE PENTIPS 31 gauge x 5/16 (8 mm) Ndle USE WITH INSULIN AND VICTOZA 5 TIMES DAILY   ??? [DISCONTINUED] liraglutide (VICTOZA 2-PAK) 0.6 mg/0.1 mL (18 mg/3 mL) injection Inject 0.1 mL (0.6 mg total) under the skin daily.   ??? [DISCONTINUED] ranolazine (RANEXA) 500 MG 12 hr tablet Take by mouth two (2) times a day.   ??? [DISCONTINUED] traZODone (DESYREL) 50 MG tablet Take 1 tablet (50 mg total) by mouth nightly.       Past Medical History:  Past Medical History:   Diagnosis Date   ??? Angina at rest (CMS-[name])    ??? Arthritis    ??? Asthma    ??? Atrial fibrillation and flutter (CMS-[name]) 06/08/14   ??? BMI 40.0-44.9, adult (CMS-[name]) 10/29/2015   ??? Chronic anticoagulation 02/17/2018   ??? Chronic kidney disease, stage 3    ??? Chronic pain syndrome 04/11/2014    ~Use daily lyrica as recommended ~Tramadol as needed for pain ~Fluoxetine daily as recommended ~Daily exercise ~Eat a healthy diet ~Good control of your blood sugars can help    ??? Coronary artery disease    ??? Depression    ??? Diabetes mellitus (CMS-[name])    ??? Eye trauma 1998    glass in both eyes and removed   ??? GERD (gastroesophageal reflux disease)    ??? Gout    ??? Homeless    ??? Hyperlipidemia    ??? Hypertension    ??? Illiteracy and low-level  literacy    ??? Microscopic hematuria    ??? Migraine    ??? Nephrolithiasis    ??? 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 Nonalcoholic fatty liver disease    ??? NSTEMI (non-ST elevated myocardial infarction) (CMS-[name]) 01/13/2017   ??? Obesity    ??? Obstructive sleep apnea 2009   ??? Panic attacks    ??? 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 Seizure-like activity (CMS-[name])     ~Monitor for symptoms and report them to your primary care provider if they occur    ??? Systolic CHF, chronic (CMS-[name])     EF 40-45% 2017   ??? TIA (transient ischemic attack)        Past Surgical History:  Past Surgical History:   Procedure Laterality Date   ??? CARDIAC CATHETERIZATION  03/08/2007   ??? CORONARY STENT PLACEMENT     ??? CYSTOURETHROSCOPY  12/31/2009     Microscopic hematuria   ??? KNEE SURGERY Right 09/23/2006    arthroscopic knee surgery medial and lateral meniscus tears as well as crystal disease   ??? PR CATH PLACE/CORON ANGIO, IMG SUPER/INTERP,R&L HRT CATH, L HRT VENTRIC N/A 02/13/2017    Procedure: Left/Right Heart Catheterization;  Surgeon: Marlaine Hind, MD;  Location: [facility] CATH;  Service: Cardiology   ??? PR CATH PLACE/CORON ANGIO, IMG SUPER/INTERP,W LEFT HEART VENTRICULOGRAPHY N/A 03/17/2017    Procedure: Left Heart Catheterization W Intervention;  Surgeon: Marlaine Hind, MD;  Location: [facility] CATH;  Service: Cardiology   ??? PR CATH PLACE/CORON ANGIO, IMG SUPER/INTERP,W LEFT HEART VENTRICULOGRAPHY N/A 05/12/2018    Procedure: CATH LEFT HEART CATHETERIZATION W INTERVENTION;  Surgeon: Dorathy Kinsman, MD;  Location: [facility] CATH;  Service: Cardiology   ??? PR CATH PLACE/CORON ANGIO, IMG SUPER/INTERP,W LEFT HEART VENTRICULOGRAPHY N/A 10/22/2018    Procedure: CATH LEFT HEART CATHETERIZATION W INTERVENTION;  Surgeon: Marlaine Hind, MD;  Location: [facility] CATH;  Service: Cardiology   ??? PR ECMO/ECLS INITIATION VENO-ARTERIAL N/A 03/19/2017    Procedure: ECMO/ECLS; INITIATION, VENO-ARTERIAL;  Surgeon: Lennie Odor, MD;  Location: MAIN OR [facility];  Service: Cardiac Surgery   ??? PR ECMO/ECLS RMVL PRPH CANNULA OPEN 6 YRS & OLDER Left 03/25/2017    Procedure: ECMO / ECLS PROVIDED BY PHYSICIAN; REMOVAL OF PERIPHERAL (ARTERIAL AND/OR VENOUS) CANNULA(E), OPEN, 6 YEARS AND OLDER;  Surgeon: Alonna Buckler Ikonomidis, MD;  Location: MAIN OR [facility];  Service: Cardiac Surgery   ??? PR EPHYS EVAL W/ ABLATION SUPRAVENT ARRHYTHMIA N/A 07/19/2015    Catheter ablation of cavotricuspid isthmus for atrial flutter [facility])   ??? PR INSER HART PACER XVENOUS ATRIAL N/A 05/30/2015    [employer] dual-chamber pacemaker implant (E.Chung)   ??? PR INSERT/PLACE FLOW DIRECT CATH N/A 03/17/2017    Procedure: Insert Leave In [place];  Surgeon: Marlaine Hind, MD;  Location: [facility] CATH;  Service: Cardiology   ??? PR INSJ/RPLCMT PERM DFB W/TRNSVNS LDS 1/DUAL CHMBR N/A 05/04/2017    Procedure: ICD Implant System (Single/Dual);  Surgeon: Eldred Manges, MD;  Location: [facility] CATH;  Service: Cardiology   ??? PR NEGATIVE PRESSURE WOUND THERAPY DME >  50 SQ CM Left 03/25/2017    Procedure: Neg Press Wound Tx (Vac Assist) Incl Topicals, Per Session, Tsa Greater Than/= 50 Cm Squared;  Surgeon: Alonna Buckler Ikonomidis, MD;  Location: MAIN OR [facility];  Service: Cardiac Surgery   ??? PR PRQ TRLUML CORONARY STENT W/ANGIO ONE ART/BRNCH N/A 03/12/2018    Procedure: Percutaneous Coronary Intervention;  Surgeon: Marlaine Hind, MD;  Location: [facility] CATH;  Service: Cardiology   ??? PR REBL VES DIRECT,LOW EXTREM Left 03/19/2017    Procedure: Repr Bld Vessel Direct; Lower Extrem;  Surgeon: Boykin Reaper, MD;  Location: MAIN OR [facility];  Service: Vascular   ??? PR REMV ART CLOT ILIAC-POP,LEG INCIS Left 03/25/2017    Procedure: EMBOLECTOMY OR THROMBECTOMY, WITH OR WITHOUT CATHETER; FEMOROPOPLITEAL, AORTOILIAC ARTERY, BY LEG INCISION;  Surgeon: Alonna Buckler Ikonomidis, MD;  Location: MAIN OR [facility];  Service: Cardiac Surgery   ??? PR UPPER GI ENDOSCOPY,BIOPSY N/A 02/28/2016    Procedure: UGI ENDOSCOPY; WITH BIOPSY, SINGLE OR MULTIPLE;  Surgeon: Liane Comber, MD;  [facility]: [facility];  Service: Gastroenterology   ??? PR UPPER GI ENDOSCOPY,DIAGNOSIS N/A 05/07/2016    Procedure: UGI ENDO, INCLUDE ESOPHAGUS, STOMACH, & DUODENUM &/OR JEJUNUM; DX W/WO COLLECTION SPECIMN, BY BRUSH OR WASH;  Surgeon: Modena Nunnery, MD;  Location: [facility];  Service: Gastroenterology       Family History:  Family History   Problem Relation Age of Onset   ??? Diabetes Mother    ??? Hyperlipidemia Mother    ??? Heart disease Mother    ??? Basal cell carcinoma Mother    ??? Blindness Mother         diabeties   ??? Obesity Mother    ??? Hyperlipidemia Father    ??? Heart disease Father    ??? Lung disease Father    ??? Heart disease Half-Sister    ??? Kidney disease Half-Sister    ??? Gallbladder disease Half-Brother    ??? Obesity Half-Brother    ??? No Known Problems Daughter    ??? No Known Problems Son    ??? Obesity Daughter    ??? Depression Maternal Uncle         Died by suicide in 30-Sep-2017 by gunshot   ??? Melanoma Neg Hx    ??? Squamous cell carcinoma Neg Hx    ??? Macular degeneration Neg Hx    ??? Glaucoma Neg Hx        Social History:  Social History     Socioeconomic History   ??? Marital status: Married     Spouse name: [name]   ??? Number of children: 3   ??? Years of education: 53   ??? Highest education level: Not on file   Occupational History   ??? Occupation: DISABLED     Comment: Former IT sales professional   Tobacco Use   ??? Smoking status: Never Smoker   ??? Smokeless tobacco: Current User     Types: Chew   ??? Tobacco comment: 08/15/19:  Dips 1x a day./ /Pt in process of reducing tobacco use,  dips 1 can every 3 weeks,   Vaping Use   ??? Vaping Use: Never used   Substance and Sexual Activity   ??? Alcohol use: Never     Alcohol/week: 0.0 standard drinks     Comment: rare use: couple times a year, small amount   ??? Drug use: No   ??? Sexual activity: Not Currently     Partners: Female   Other Topics  Concern   ??? Do you use sunscreen? No   ??? Tanning bed use? No   ??? Are you easily burned? No   ??? Excessive sun exposure? No   ??? Blistering sunburns? No   Social History Narrative    Information updated:  01/08/16. Reviewed last: 12/01/18, 08/15/19        Born in [place]    Raised with both parents    Parents died w/in 6 mos of each other. His parents knew they were going to pass and told him just before.    Half sister and 2 half brothers        Married 31 yrs. Wife [name]    2 other children son and daughter, adopted out    Daughter Bridgette, bf DJ        Education - completed through 11th. 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    Former Presenter, broadcasting since age 61    Lives with wife, difficulty paying bills 12/30/17     Lives next door to daughter, husband and their grandson        05/19/18: pt moving to new home in next 5 days- after an eviction. Has Medicaid assistance again. Will be w/ wife [name], they babysit their grandson often.    06/14/18: Pt and husband living with daughter while new location to live is determined.    12/01/18: pt living with wife and friends. Friends have two young children. [name] states soon they will have their own place, however doesn't know when. Pt states it will be after the neighbors are evicted, we can move in. Pt will need to start dialysis soon. Pt and wife Not talking with daughter and grandson lately.      Social Determinants of Health     Financial Resource Strain: Low Risk    ??? Difficulty of Paying Living Expenses: Not hard at all   Food Insecurity: No Food Insecurity   ??? Worried About Programme researcher, broadcasting/film/video in the Last Year: Never true   ??? Ran Out of Food in the Last Year: Never true   Transportation Needs: No Transportation Needs   ??? Lack of Transportation (Medical): No   ??? Lack of Transportation (Non-Medical): No   Physical Activity:    ??? Days of Exercise per Week:    ??? Minutes of Exercise per Session:    Stress: Stress Concern Present   ??? Feeling of Stress : Rather much   Social Connections: Moderately Isolated   ??? Frequency of Communication with Friends and Family: More than three times a week   ??? Frequency of Social Gatherings with Friends and Family: Twice a week   ??? Attends Religious Services: Never   ??? Active Member of Clubs or Organizations: No   ??? Attends Banker Meetings: Never   ??? Marital Status: Married       Objective     Vitals - past 24 hours  Temp:  [36.8 ??C] 36.8 ??C  Heart Rate:  [78-100] 90  SpO2 Pulse:  [78-91] 88  Resp:  [13-23] 13  BP: (136-178)/(70-98) 169/75  SpO2:  [95 %-99 %] 96 % Intake/Output  No intake/output data recorded.     Physical Exam:    GEN: no acute distress   HENT: MMM, oropharynx  clear w/o erythema or ulcerations   EYES: no scleral icterus or conjunctival injection, PERRLA, EOMI  NECK: unable to assess for JVD given neck circumference  CV: distant heart sounds, RRR, no murmurs  LUNGS: mild bibasilar crackles, no wheezing, rhonchorous breath sounds throughout  ABD: obese, soft, NT/ND  EXT: 2+ pitting edema to shins bilaterally   NEURO: alert, oriented x3, no FNDs  SKIN: no abrasions, erythema, or rashes appreciated on exposed skin     Continuous Infusions:   ??? [START ON 08/19/2019] heparin     ??? [START ON 08/19/2019] nitroglycerin     ??? NxStage RFP 400 (+/- BB) 5000 mL - contains 2 mEq/L of potassium     ??? NxStage RFP 401 (+/- BB) 5000 mL - contains 4 mEq/L of potassium         Scheduled Medications:   ??? [START ON 08/19/2019] amiodarone  200 mg Oral Daily   ??? [START ON 08/19/2019] amLODIPine  10 mg Oral Daily   ??? [START ON 08/19/2019] aspirin  325 mg Oral Once   ??? atorvastatin  80 mg Oral Daily   ??? carvediloL  25 mg Oral BID   ??? ezetimibe  10 mg Oral Nightly   ??? fentaNYL (PF)       ??? fentaNYL (PF)  25 mcg Intravenous Once   ??? [START ON 08/19/2019] FLUoxetine  80 mg Oral Daily   ??? [START ON 08/19/2019] gabapentin  300 mg Oral BID   ??? [START ON 08/21/2019] isosorbide mononitrate  30 mg Oral Every Other Day   ??? ondansetron       ??? [START ON 08/19/2019] pantoprazole  40 mg Oral Daily   ??? sevelamer  800 mg Oral 3xd Meals   ??? ticagrelor  90 mg Oral BID       PRN medications:      Data/Imaging Review: Reviewed in Epic and personally interpreted on 08/18/2019. See EMR for detailed results.

## 2019-08-19 NOTE — Unmapped (Cosign Needed)
[facility]  [place], [place]  Tel: [phone]     Fax: [phone]       HISTORY & PHYSICAL ASSESSMENT    PCP:  Kurtis Bushman, MD  Phone:  [phone]  Fax:  [phone]    Referring Physicians:  Referred Self  No address on file     Primary Cardiologist:  Eppie Gibson MD    Procedures to be performed:   Left Heart Catheterization, Left Ventriculogram, Coronary angiography, Bypass Graft Angiography, Possible Percutaneous Coronary Intervention    Indication:  NSTEMI    Consent:  I hereby certify that the nature, purpose, benefits, usual and most frequent risks of, and alternatives to, the operation or procedure have been explained to the patient (or person authorized to sign for the patient) either by a physician or by the provider who is to perform the operation or procedure, that the patient has had an opportunity to ask questions, and that those questions have been answered. The patient or the patient's representative has been advised that selected tasks Ferre be performed by assistants to the primary health care provider(s). I believe that the patient (or person authorized to sign for the patient) understands what has been explained, and has consented to the operation or procedure.  _____________________________________________________________________    HISTORY:  Jeremy Oliver is a 49 y.o. male with a past medical history significant for ESRD on dialysis (T/TH/S), CAD??s/p multiple PCIs, hx of VT after STEMI in 2018 s/p ICD,??T2DM, HTN, HLD, HFrEF (echo 09/2018 EF 35% w G1DD),??TIA, A-fib??on??Apixaban, OSA on CPAP, non-epileptic seizures, bilateral DVTs presenting??after an episode of a substernal chest pain with associated shortness of breath when waking up this morning.     Cath   Had an STEMI in 2018 with VT arrest (s/p ICD) with DES to mLAD. Presented with NSTEMI on 03/12/2018 and had 2x DES placed in??the mid RCA and RPLA/RPDA bifurcation.??NSTEMI in 05/2018 w/??PCI to proximal/mid LAD.??Another NSTEMI 09/2018 w/ PCI to distal RCA.      Risk factors for coronary artery disease include:  Complex CAD  Tobacco use    Assessments:  ECG :  08/18/19: NSR, inferolateral T wave changes, Poor R wave progression   Last cath : October 22, 2018  ____________________________________________________________________________  Findings:  1.   Significant 90% stenosis of distal RCA, severe proximal LCx stenosis, and diffuse severe distal LAD stenosis  2.   Elevated LVEDP = 22   3.   Successful DES to the distal RCA with the placement of a Synergy 4.0 x 20 with excellent angiographic result and TIMI 3 flow.    On chronic amiodarone therapy  No cardiac CTA performed  No prior angio WITHOUT intervention.  No record of EF within 6 months. Previous EF 35%  No Agatston coronary calcium score was assessed.  CSHA Clinical Frailty Scale : 3 - Managing Well  Chest Pain Assessment : typical angina   Cardiovascular Instability : persistent ischemic symptoms    Medications Administered : (pre-procedure)  Aspirin    Medications Contraindicated :   None documented    Visit Vitals  BP 127/61   Pulse 72   Temp 37 ??C (Axillary)   Resp 15   Ht 188 cm (6' 2.02)   Wt (!) 166.8 kg (367 lb 12.8 oz)   SpO2 98%   BMI 47.20 kg/m??     GEN: no acute distress   HENT: MMM, oropharynx clear w/o erythema or ulcerations   EYES: no scleral  icterus or conjunctival injection, PERRLA, EOMI  NECK: unable to assess for JVD given neck circumference  CV: distant heart sounds, RRR, no murmurs  LUNGS: mild bibasilar crackles, no wheezing, rhonchorous breath sounds throughout  ABD: obese, soft, NT/ND  EXT: 2+ pitting edema to shins bilaterally   NEURO: alert, oriented x3, no FNDs  SKIN: no abrasions, erythema, or rashes appreciated on exposed skin     Labs and imaging were reviewed: on HD; scr 4.06

## 2019-08-19 NOTE — Unmapped (Signed)
VENOUS ACCESS ULTRASOUND PROCEDURE NOTE    Indications:   Poor venous access.    The Venous Access Team has assessed this patient for the placement of a PIV. Ultrasound guidance was necessary to obtain access.     Procedure Details:  Identity of the patient was confirmed via name, medical record number and date of birth. The availability of the correct equipment was verified.    The vein was identified for ultrasound catheter insertion.  Field was prepared with necessary supplies and equipment.  Probe cover and sterile gel utilized.  Insertion site was prepped with chlorhexidine solution and allowed to dry.  The catheter extension was primed with normal saline.A(n) 20 g x 1.75 inch catheter was placed in the left forearm with 1 attempt(s).     Catheter aspirated, 4 mL blood return present. The catheter was then flushed with 10 mL of normal saline. Insertion site cleansed, and dressing applied per manufacturer guidelines. The catheter was inserted without difficulty  by Michel Santee RN.      RN was notified.     Thank you,     Michel Santee RN Venous Access Team   [phone]     Workup / Procedure Time:  30 minutes    See vein image below:

## 2019-08-19 NOTE — Unmapped (Signed)
Continuous Renal Replacement  Dialysis Nurse Therapy Procedure Note    Treatment Type:  [facility] Number Of Days On Therapy:  0 Procedure Date:  08/19/2019 1:11 AM     TREATMENT STATUS:  Initiated  Patient and Treatment Status     None          Active Dialysis Orders (168h ago, onward)     Start     Ordered    08/18/19 2131  CRRT Orders - NxStage (Adult)  Continuous     Comments: Fluid Removal Rate parameters:  MAP   < 50 mmHg 10 mL/hr;  MAP 51-60 mmHg 10 mL/hr;  MAP 61-65 mmHg 20 mL/hr;  MAP 66-70 mmHg 150 mL/hr;  MAP   > 70 mmHg 300-450 mL/hr   Question Answer Comment   CRRT System: NxStage    Modality: CVVH    Access: Right Internal Jugular    BFR (mL/min): 200-350    Dialysate Flow Rate (mL/kg/hr): Other (Specify) 5L/hr       08/18/19 2138              SYSTEM CHECK:  Machine Name: [id number]  Dialyzer: [id number]   Self Test Completed: Yes.        Alarms Connected To The Wall And Active:  No.    VITAL SIGNS:  Temp:  [36.8 ??C (98.2 ??F)-36.9 ??C (98.4 ??F)] 36.9 ??C (98.4 ??F)  Heart Rate:  [75-100] 75  SpO2 Pulse:  [78-91] 89  Resp:  [13-23] 22  SpO2:  [87 %-99 %] 99 %  BP: (127-181)/(61-98) 127/61  MAP (mmHg):  [88-118] 88  A BP-2: (112-162)/(51-81) 117/52  MAP:  [70 mmHg-107 mmHg] 72 mmHg    ACCESS SITE:     Hemodialysis Catheter 07/27/19 Right Internal jugular 2.2 mL 2.3 mL (Active)   Site Assessment Clean;Dry 08/19/19 0055   Proximal Lumen Status / Patency Infusing 08/19/19 0055   Proximal Lumen Intervention Accessed 08/19/19 0055   Medial Lumen Status / Patency Infusing 08/19/19 0055   Medial Lumen Intervention Accessed 08/19/19 0055   Dressing Intervention Dressing changed 08/19/19 0055   Dressing Status      Changed 08/19/19 0055   Verification by X-ray Yes 07/28/19 1220   Site Condition No complications 08/19/19 0055   Dressing Type CHG gel;Occlusive 08/19/19 0055   Dressing Change Due 08/26/19 08/19/19 0055   Line Necessity Reviewed? Y 08/19/19 0055   Line Necessity Indications Yes - Hemodialysis 08/19/19 0055   Line Necessity Reviewed With [facility] 08/18/19 2130             CATHETER FILL VOLUMES:     Arterial: 2.2 mL  Venous: 2.3 mL     Lab Results   Component Value Date    NA 136 08/18/2019    K 4.2 08/18/2019    CL 108 (H) 08/18/2019    CO2 23.0 08/18/2019    BUN 32 (H) 08/18/2019     Lab Results   Component Value Date    CALCIUM 7.7 (L) 08/18/2019    CAION 4.53 07/02/2018    PHOS 3.9 08/18/2019    MG 1.6 08/18/2019        SETTINGS:  Blood Pump Rate: 300 mL/min  Replacement Fluid Rate:     Pre-Blood Pump Fluid Rate:    Hourly Fluid Removal Rate: 300 mL/hr   Dialysate Fluid Rate    Therapy Fluid Temperature:       ANTICOAGULANT:  None    ADDITIONAL COMMENTS:  None    HEMODIALYSIS  ON-CALL NURSE PAGER NUMBER:  ?? Monday thru Saturday 0700 - 1730: Call the Dialysis Unit ext. [phone]   ?? After 1730 and all day Sunday: Call the Dialysis RN Pager Number [phone]     PROCEDURE REVIEW, VERIFICATION, HANDOFF:  CRRT settings verified, procedure reviewed, and instructions given to primary RN.     Primary CRRT RN Verifying: Bailey Mech RN Dialysis RN Verifying: Manley Mason

## 2019-08-20 LAB — BASIC METABOLIC PANEL
ANION GAP: 8 mmol/L (ref 7–15)
ANION GAP: 8 mmol/L (ref 7–15)
BLOOD UREA NITROGEN: 27 mg/dL — ABNORMAL HIGH (ref 7–21)
BLOOD UREA NITROGEN: 32 mg/dL — ABNORMAL HIGH (ref 7–21)
BLOOD UREA NITROGEN: 33 mg/dL — ABNORMAL HIGH (ref 7–21)
BUN / CREAT RATIO: 7
BUN / CREAT RATIO: 7
BUN / CREAT RATIO: 6
CALCIUM: 8.1 mg/dL — ABNORMAL LOW (ref 8.5–10.2)
CALCIUM: 7.7 mg/dL — ABNORMAL LOW (ref 8.5–10.2)
CALCIUM: 7.7 mg/dL — ABNORMAL LOW (ref 8.5–10.2)
CHLORIDE: 103 mmol/L (ref 98–107)
CHLORIDE: 102 mmol/L (ref 98–107)
CHLORIDE: 104 mmol/L (ref 98–107)
CO2: 24 mmol/L (ref 22.0–30.0)
CO2: 22 mmol/L (ref 22.0–30.0)
CREATININE: 3.89 mg/dL — ABNORMAL HIGH (ref 0.70–1.30)
CREATININE: 4.74 mg/dL — ABNORMAL HIGH (ref 0.70–1.30)
CREATININE: 5.26 mg/dL — ABNORMAL HIGH (ref 0.70–1.30)
EGFR CKD-EPI AA MALE: 19 mL/min/{1.73_m2} — ABNORMAL LOW (ref >=60)
EGFR CKD-EPI AA MALE: 15 mL/min/{1.73_m2} — ABNORMAL LOW (ref >=60)
EGFR CKD-EPI AA MALE: 13 mL/min/{1.73_m2} — ABNORMAL LOW (ref >=60)
EGFR CKD-EPI NON-AA MALE: 13 mL/min/{1.73_m2} — ABNORMAL LOW (ref >=60)
GLUCOSE RANDOM: 179 mg/dL (ref 70–179)
GLUCOSE RANDOM: 178 mg/dL (ref 70–179)
GLUCOSE RANDOM: 220 mg/dL — ABNORMAL HIGH (ref 70–179)
POTASSIUM: 4.8 mmol/L (ref 3.5–5.0)
POTASSIUM: 4.8 mmol/L (ref 3.5–5.0)
POTASSIUM: 4.7 mmol/L (ref 3.5–5.0)
SODIUM: 134 mmol/L — ABNORMAL LOW (ref 135–145)

## 2019-08-20 LAB — PHOSPHORUS
PHOSPHORUS: 4.3 mg/dL (ref 2.9–4.7)
Phosphate:MCnc:Pt:Ser/Plas:Qn:: 3.8
Phosphate:MCnc:Pt:Ser/Plas:Qn:: 4.3
Phosphate:MCnc:Pt:Ser/Plas:Qn:: 4.3

## 2019-08-20 LAB — CREATININE: Creatinine:MCnc:Pt:Ser/Plas:Qn:: 4.74 — ABNORMAL HIGH

## 2019-08-20 LAB — MAGNESIUM
MAGNESIUM: 1.7 mg/dL (ref 1.6–2.2)
Magnesium:MCnc:Pt:Ser/Plas:Qn:: 1.7
Magnesium:MCnc:Pt:Ser/Plas:Qn:: 1.7
Magnesium:MCnc:Pt:Ser/Plas:Qn:: 1.8

## 2019-08-20 LAB — CBC
HEMATOCRIT: 28 % — ABNORMAL LOW (ref 41.0–53.0)
HEMOGLOBIN: 8.9 g/dL — ABNORMAL LOW (ref 13.5–17.5)
MEAN CORPUSCULAR HEMOGLOBIN CONC: 31.7 g/dL (ref 31.0–37.0)
MEAN CORPUSCULAR HEMOGLOBIN: 27.5 pg (ref 26.0–34.0)
PLATELET COUNT: 258 10*9/L (ref 150–440)
RED BLOOD CELL COUNT: 3.23 10*12/L — ABNORMAL LOW (ref 4.50–5.90)
RED CELL DISTRIBUTION WIDTH: 16.3 % — ABNORMAL HIGH (ref 12.0–15.0)
WBC ADJUSTED: 8.9 10*9/L (ref 4.5–11.0)

## 2019-08-20 LAB — CO2: Carbon dioxide:SCnc:Pt:Ser/Plas:Qn:: 22

## 2019-08-20 LAB — RED CELL DISTRIBUTION WIDTH: Lab: 16.3 — ABNORMAL HIGH

## 2019-08-20 LAB — POTASSIUM: Potassium:SCnc:Pt:Ser/Plas:Qn:: 4.8

## 2019-08-20 NOTE — Unmapped (Signed)
Patient began shift on CRRT with one IV anticoagulant medication infusing. RN immediately titrated up the fluid removal of patient per BP parameters in order to remove as much fluid as possible prior to left heart cath. See flow sheet for details. Pt. Did not need any BP supporting IV medications during shift in CICU. Pt. To cath lab at approx. 1300. Pt. Came back having arterial line removed due to site as access for cath procedure. TR band in place. At time of note RN has twice removed air from TR band without bleeding complication. CRRT not restarted on return to unit. Pt. Scheduled for IHD Saturday in Dialysis unit. Pt. Made routine level status after return from cath lab. Spouse at bedside for shift continuously updated on plan of care. Will continue to monitor.    Problem: Adult Inpatient Plan of Care  Goal: Plan of Care Review  Outcome: Progressing  Goal: Patient-Specific Goal (Individualization)  Outcome: Progressing  Goal: Absence of Hospital-Acquired Illness or Injury  Outcome: Progressing  Goal: Optimal Comfort and Wellbeing  Outcome: Progressing  Goal: Readiness for Transition of Care  Outcome: Progressing  Goal: Rounds/Family Conference  Outcome: Progressing     Problem: Heart Failure Comorbidity  Goal: Maintenance of Heart Failure Symptom Control  Outcome: Progressing     Problem: Hypertension Comorbidity  Goal: Blood Pressure in Desired Range  Outcome: Progressing     Problem: Obstructive Sleep Apnea Risk or Actual (Comorbidity Management)  Goal: Unobstructed Breathing During Sleep  Outcome: Progressing

## 2019-08-20 NOTE — Unmapped (Signed)
Cardiology Discharge Summary    Identifying Information:  Jeremy Oliver  1972  [id number]     Admit Date: 08/18/2019   Discharge Date: 08/20/2019   Discharge Service: Cardiology [facility])  Discharge Attending Physician: Lesle Reek, MD  Primary Care Physician: Kurtis Bushman, MD     Discharge Diagnoses: Acute hypoxic respiratory failure, ESRD, HFrEF, CAD with unstable angina s/p PCI, hypertensive emergency    Principal Diagnosis:  Type 2 MI  Unstable Angina  Chest Pain    Secondary Diagnoses:  Type 2 MI  Unstable Angina  Chest Pain  HFrEF, Chronic  Mitral Regurgitation  Hypertension  Hyperlipidemia  Diabetes Mellitus Type 2  Hypoxic Respiratory Failure evidenced by dyspnea, increased work of breathing, O2 Sat <90%, pO2 <60 mmHg, and/or P/F ratio <300, Acute on Chronic  Acute Pulmonary Edema  End Stage Renal Disease     Obesity  Type 2 Diabetes Mellitus  Anemia        Hospital Course:  Jeremy Oliver is a 49 y.o. male with a past medical history significant for ESRD on dialysis (T/TH/S), CAD??s/p multiple PCIs, hx of VT after STEMI in 2018 s/p ICD,??T2DM, HTN, HLD, HFrEF (echo 09/2018 EF 35% w G1DD),??TIA, A-fib??on??Apixaban, OSA on CPAP, non-epileptic seizures, bilateral DVTs presenting??after an episode of substernal chest pain with associated shortness of breath prior to dialysis and presentation to ED. He had been tolerating dialysis well and did not receive dialysis day of presentation as concerned for chest pain. In the ED he became acutely hypertensive while stressed and developed an oxygen requirement thought secondary to hypertensive emergency and flash pulmonary edema. Admitted to CICU for CRRT and home bipap with oxygen wean and fluid removal. Patient had a scheduled cardiac catheterization for 08/19/19 with Dr. Andrey Farmer and this was completed after fluid removal and patient comfort laying flat. There was significant 2-vessel coronary artery disease with 90% proximal LAD and 80% proximal LCx stenosis. Patent stents in the RCA found. LVEDP = 24 mm Hg. S/p PCI to LAD and LCx with placement of Synergy DES x2 with TIMI 3 flow. Rescue balloon angioplasty of the DIAG2 also occurred after dilation of the LAD stent when it was found to be occluded. Discharged on home eliquis and brilinta after receiving ASA load prior to stent. Tolerated iHD on 08/20/19. Follow-up with Dr. Andrey Farmer requested    Outpatient Provider Follow Up Issues:  [  ] ECHO in outpatient setting following stent    Procedures:  LHC with Coronary Angiography and PCI, Arterial line  No admission procedures for hospital encounter.    Discharge Day Services:  BP 119/79  - Pulse 74  - Temp 36.7 ??C (Oral)  - Resp 17  - Ht 188 cm (6' 2.02)  - Wt (!) 167.3 kg (368 lb 13.3 oz)  - SpO2 97%  - BMI 47.33 kg/m??   Pt seen on the day of discharge and determined appropriate for discharge.    Condition at Discharge: stable    Discharge Medications:     Your Medication List      CHANGE how you take these medications    blood sugar diagnostic Strp  Disp to match current ins approved meter. Use to test blood sugar four times daily. E11.22, Z79.4  What changed: additional instructions     FLUoxetine 40 MG capsule  Commonly known as: PROzac  Take 2 capsules (80 mg total) by mouth daily.  What changed: when to take this  CONTINUE taking these medications    acetaminophen 500 MG tablet  Commonly known as: TYLENOL  TAKE 2 TABLETS (1 000MG ) BY MOUTH EVERY 8 HOURS AS NEEDED FOR PAIN     allopurinoL 100 MG tablet  Commonly known as: ZYLOPRIM  Take 2 tablets (200 mg total) by mouth daily.     amiodarone 200 MG tablet  Commonly known as: PACERONE  TAKE 1 TABLET BY MOUTH ONCE DAILY     amLODIPine 10 MG tablet  Commonly known as: NORVASC  Take 10 mg by mouth daily.     apixaban 5 mg Tab  Commonly known as: ELIQUIS  Take 1 tablet (5 mg total) by mouth Two (2) times a day.     atorvastatin 80 MG tablet  Commonly known as: LIPITOR  Take 1 tablet (80 mg total) by mouth daily. BLOOD PRESSURE CUFF Misc  Generic drug: miscellaneous medical supply  Measure BP once a day or if any symptoms     blood-glucose meter kit  Disp. blood glucose meter kit preferred by patient's insurance. Dx: Diabetes, E11.9     BRILINTA 90 mg Tab  Generic drug: ticagrelor  TAKE 1 TABLET BY MOUTH TWICE DAILY     carvediloL 25 MG tablet  Commonly known as: COREG  Take 1 tablet (25 mg total) by mouth Two (2) times a day. HOLD morning dose on dialysis days     cholecalciferol (vitamin D3 25 mcg (1,000 units)) 1,000 unit (25 mcg) tablet  Take 1 tablet (25 mcg total) by mouth daily.     diclofenac sodium 1 % gel  Commonly known as: VOLTAREN  Apply 2 grams topically Three (3) times a day.     ezetimibe 10 mg tablet  Commonly known as: ZETIA  TAKE 1 TABLET BY MOUTH ONCE DAILY     gabapentin 300 MG capsule  Commonly known as: NEURONTIN  Take 1 capsule (300 mg total) by mouth two (2) times a day.     hydrOXYzine 10 MG tablet  Commonly known as: ATARAX  Take 1 tablet (10 mg total) by mouth nightly as needed for anxiety.     insulin syringe-needle U-100 0.3 mL 31 gauge x 5/16 (8 mm) Syrg  Use to inject insulin twice daily with meals     isosorbide mononitrate 30 MG 24 hr tablet  Commonly known as: IMDUR  Take 1 tablet (30 mg total) by mouth daily. DO NOT TAKE on dialysis days     lancets Misc  Disp. Ins preferred lancets. Use to test blood sugar four times daily. E11.22, Z79.4     LEVEMIR FLEXTOUCH U-100 INSULN 100 unit/mL (3 mL) injection pen  Generic drug: insulin detemir U-100  Inject 37 units total under the skin nightly     loperamide 2 mg capsule  Commonly known as: IMODIUM  Take 1 capsule (2 mg total) by mouth 4 (four) times a day as needed for diarrhea.     nicotine polacrilex 4 MG gum  Commonly known as: NICORETTE  Apply 1 each (4 mg total) to cheek every hour as needed for smoking cessation.     nitroglycerin 0.4 MG SL tablet  Commonly known as: NITROSTAT  Place 1 tablet (0.4 mg total) under the tongue every five (5) minutes as needed for chest pain.     NovoLOG Flexpen U-100 Insulin 100 unit/mL (3 mL) injection pen  Generic drug: insulin ASPART  Inject 15 units under skin three times a day with meals     omeprazole  40 MG capsule  Commonly known as: PriLOSEC  Take 1 capsule (40 mg total) by mouth Two (2) times a day (30 minutes before a meal).     OZEMPIC 0.25 mg or 0.5 mg(2 mg/1.5 mL) Pnij injection  Generic drug: semaglutide  Inject 0.5 mg under the skin every seven (7) days.     PRALUENT PEN 150 mg/mL subcutaneous injection  Generic drug: alirocumab  Inject the contents of 1 pen (150 mg total) under the skin every fourteen (14) days.     RENVELA 800 mg tablet  Generic drug: sevelamer  TAKE 2 TABLETS (1600MG ) BY MOUTH THREE TIMES DAILY WITH MEALS     UNIFINE PENTIPS 31 gauge x 5/16 (8 mm) Ndle  Generic drug: pen needle, diabetic  USE WITH INSULIN AND VICTOZA 5 TIMES DAILY     VENTOLIN HFA 90 mcg/actuation inhaler  Generic drug: albuterol  Inhale 2 puffs into the lungs every 6 (six) hours as needed for wheezing or shortness of breath.            Recent Labs:  Microbiology Results (last day)     ** No results found for the last 24 hours. **        Lab Results   Component Value Date    WBC 8.9 08/20/2019    HGB 8.9 (L) 08/20/2019    HCT 28.0 (L) 08/20/2019    PLT 258 08/20/2019     Lab Results   Component Value Date    NA 134 (L) 08/20/2019    K 4.7 08/20/2019    CL 104 08/20/2019    CO2 22.0 08/20/2019    BUN 33 (H) 08/20/2019    CREATININE 5.26 (H) 08/20/2019    CALCIUM 7.7 (L) 08/20/2019    MG 1.8 08/20/2019    PHOS 4.3 08/20/2019     Lab Results   Component Value Date    ALKPHOS 122 (H) 08/18/2019    BILITOT 0.3 08/18/2019    BILIDIR 0.10 08/18/2019    PROT 6.6 08/18/2019    ALBUMIN 2.6 (L) 08/18/2019    ALT 9 (L) 08/18/2019    AST 11 08/18/2019    GGT 32 01/09/2012     Lab Results   Component Value Date    PT 15.2 (H) 07/26/2019    INR 1.29 07/26/2019    APTT 43.9 (H) 08/19/2019     Radiology:  ECG 12 Lead    Result Date: 08/19/2019  NORMAL SINUS RHYTHM LEFT VENTRICULAR HYPERTROPHY WITH REPOLARIZATION ABNORMALITY ABNORMAL ECG WHEN COMPARED WITH ECG OF 20-Nylund-2021 14:35, NO SIGNIFICANT CHANGE WAS FOUND    ECG 12 lead    Result Date: 08/19/2019  SINUS RHYTHM WITH 1ST DEGREE AV BLOCK ST & T WAVE ABNORMALITY, CONSIDER LATERAL ISCHEMIA PROLONGED QT ABNORMAL ECG WHEN COMPARED WITH ECG OF 20-Shuck-2021 14:35, INVERTED T WAVES HAVE REPLACED NONSPECIFIC T WAVE ABNORMALITY IN ANTERIOR LEADS    ECG 12 Lead    Result Date: 08/18/2019  NORMAL SINUS RHYTHM NONSPECIFIC ST AND T WAVE ABNORMALITY CANNOT RULE OUT ANTEROSEPTAL INFARCT PROLONGED QT WHEN COMPARED WITH ECG OF 20-Kenney-2021 11:03, NO SIGNIFICANT CHANGE WAS FOUND Confirmed by Lorretta Harp [phone]) on 08/18/2019 8:23:56 PM    ECG 12 lead (Adult)    Result Date: 08/18/2019  NORMAL SINUS RHYTHM NONSPECIFIC ST AND T WAVE ABNORMALITY POOR R WAVE PROGRESSION IN PRECORDIAL LEADS CONSIDER LEAD PLACEMENT , ANTEROSEPTAL INFARCT WHEN COMPARED WITH ECG OF 13-Lietzke-2021 14:45, NO SIGNIFICANT CHANGE WAS FOUND Confirmed by Lorretta Harp [phone]) on 08/18/2019  8:13:15 PM    XR Chest 2 views    Result Date: 08/18/2019  EXAM: XR CHEST 2 VIEWS DATE: 08/18/2019 12:32 PM ACCESSION: [id number] UN DICTATED: 08/18/2019 12:56 PM INTERPRETATION LOCATION: Main [facility] CLINICAL INDICATION: 49 years old Male with SHORTNESS OF BREATH  COMPARISON: 04/01/2019 TECHNIQUE: PA and Lateral Chest Radiographs. FINDINGS: Right internal jugular dialysis catheter with tip projecting over right atrium. Stable left pacemaker/AICD. Mild pulmonary vascular congestion and interstitial edema. No pleural effusion or pneumothorax. Unremarkable cardiomediastinal silhouette. Stable osseous structures.     Mild pulmonary vascular congestion and interstitial edema.      Discharge Instructions:  Activity Instructions     Activity as tolerated            Other Instructions     Call MD for:  difficulty breathing, headache or visual disturbances      Call MD for:  persistent nausea or vomiting      Call MD for:  severe uncontrolled pain      Call MD for:  temperature >38.5 Celsius      Discharge instructions      You were admitted to [facility] with chest pain, hypertension, and fluid overload. You received cardiac catheterization with stenting and continuous then intermittent dialysis. You'll continue on your home medications including Brilinta and Eliquis and continue on your dialysis schedule for Tuesday/Thursday/Saturday.     Please take your medications as prescribed. Medication changes are listed below and a full list of medications is in this discharge packet. Please keep your follow-up appointments after the hospital for ongoing care. It has been a pleasure taking care of you, we wish you the best.     MEDICATION CHANGES:  -- None    FOLLOW-UP:  -- with your PCP as scheduled  -- 2 weeks with Dr. Rocco Serene were admitted to [facility] with chest pain, hypertension, and fluid overload. You received cardiac catheterization with stenting and continuous then intermittent dialysis. You'll continue on your home medications including Brilinta and Eliquis and continue on your dialysis schedule for Tuesday/Thursday/Saturday.     Please take your medications as prescribed. Medication changes are listed below and a full list of medications is in this discharge packet. Please keep your follow-up appointments after the hospital for ongoing care. It has been a pleasure taking care of you, we wish you the best.     MEDICATION CHANGES:  -- None    FOLLOW-UP:  -- with your PCP as scheduled  -- 2 weeks with Dr. Andrey Farmer          Follow Up instructions and Outpatient Referrals     Call MD for:  difficulty breathing, headache or visual disturbances      Call MD for:  persistent nausea or vomiting      Call MD for:  severe uncontrolled pain      Call MD for:  temperature >38.5 Celsius      Discharge instructions      You were admitted to [facility] with chest pain, hypertension, and fluid overload. You received cardiac catheterization with stenting and continuous then intermittent dialysis. You'll continue on your home medications including Brilinta and Eliquis and continue on your dialysis schedule for Tuesday/Thursday/Saturday.     Please take your medications as prescribed. Medication changes are listed below and a full list of medications is in this discharge packet. Please keep your follow-up appointments after the hospital for ongoing care. It has been a  pleasure taking care of you, we wish you the best.     MEDICATION CHANGES:  -- None    FOLLOW-UP:  -- with your PCP as scheduled  -- 2 weeks with Dr. Andrey Farmer        Appointments which have been scheduled for you    Humble 25, 2021  2:00 AM  LPN - HOME VISIT with Cindi Carbon, LPN  [facility] AND HOSPICE [facility]) [address]  [place] [address]  [phone]      Roan 27, 2021 12:00 AM  SN - HOME VISIT EXTENDED with Park Meo, RN  [facility] AND HOSPICE [facility]) [address]  [address]  [place] [address]  [phone]      Pyeatt 28, 2021 To Be Determined  PT - DISCIPLINE DISCHARGE with Jodie Echevaria, PT  [facility] AND HOSPICE [facility]) [address]  [address]  [place] [address]  [phone]      Aug 30, 2019  3:00 PM  (Arrive by 2:45 PM)  VIDEO VISIT- OTHER with Ebbie Latus, LCSW  [facility]NAL MEDICINE [place] [facility]) [address]  [place] [address]  [phone]      Sep 07, 2019  8:45 AM  (Arrive by 8:30 AM)  RETURN CONTINUITY with Roma Schanz, MD  [facility] INTERNAL MEDICINE [place] [facility]) [address]  [place] [address]  [phone]      Sep 14, 2019  3:45 PM  (Arrive by 3:30 PM)  RETURN VIDEO - OTHER with Patrcia Dolly, PA  [facility] NEUROLOGY CLINIC [address] [place] [facility] REGION) [address]  [place] [address]  [phone]      Nov 01, 2019 10:30 AM  (Arrive by 10:00 AM)  REMOTE ICD FOLLOW-UP with [facility] EP REMOTE MONITORING  [facility] EP REMOTE MONITORING [facility] REGION) [address]  [address] [place] [address]  [phone]   This is a remote appointment, you do not need to come into the office.     Jan 16, 2020  8:30 AM  (Arrive by 8:15 AM)  RETURN  RHEUMATOLOGY with Monica Martinez, MD  [facility] RHEUMATOLOGY SPECIALTY [place] [facility] REGION) [address]  [place] [address]  [phone]      Jan 31, 2020 10:30 AM  (Arrive by 10:00 AM)  REMOTE ICD FOLLOW-UP with [facility] EP REMOTE MONITORING  [facility] EP REMOTE MONITORING  [facility] REGION) [address]  [address] [place] [address]  [phone]   This is a remote appointment, you do not need to come into the office.            Length of Discharge: I spent greater than 30 mins in the discharge of this patient.

## 2019-08-20 NOTE — Unmapped (Signed)
2 hour treatment scheduled today per Dr.Hladik and Dr. Myrtice Lauth. UF goal 2 L. R chest CVAD in place, no s/s of Infection noted at site. No c/o pain/discomfort. Vital signs stable.

## 2019-08-20 NOTE — Unmapped (Signed)
Patient remains floor status. Patient alert and oriented x4. X1 prn tylenol given for pain in left wrist from cath site. VSS. NSR. Afebrile. Pulses palpable, BLE doppler. Room air. CPAP overnight for sleep apnea. PO intake adequate. No BM on shift. Urine output minimal. Scheduled to go to dialysis this AM. All lines patent. Claves changed. Chlorohex tx given on shift. PT OOB to chair on shift. Plan of care reviewed with pt, all questions answered as necessary. No adverse events.       Problem: Adult Inpatient Plan of Care  Goal: Plan of Care Review  08/20/2019 0539 by Dorothe Pea, RN  Outcome: Progressing    Goal: Patient-Specific Goal (Individualization)  08/20/2019 0539 by Dorothe Pea, RN  Outcome: Progressing    Goal: Absence of Hospital-Acquired Illness or Injury  08/20/2019 0539 by Dorothe Pea, RN  Outcome: Progressing    Goal: Optimal Comfort and Wellbeing  08/20/2019 0539 by Dorothe Pea, RN  Outcome: Progressing    Goal: Readiness for Transition of Care  08/20/2019 0539 by Dorothe Pea, RN  Outcome: Progressing  Goal: Rounds/Family Conference  08/20/2019 0539 by Dorothe Pea, RN  Outcome: Progressing     Problem: Heart Failure Comorbidity  Goal: Maintenance of Heart Failure Symptom Control  08/20/2019 0539 by Dorothe Pea, RN  Outcome: Progressing     Problem: Hypertension Comorbidity  Goal: Blood Pressure in Desired Range  08/20/2019 0539 by Dorothe Pea, RN  Outcome: Progressing     Problem: Obstructive Sleep Apnea Risk or Actual (Comorbidity Management)  Goal: Unobstructed Breathing During Sleep  08/20/2019 0539 by Dorothe Pea, RN  Outcome: Progressing

## 2019-08-21 LAB — HEPATITIS B SURFACE ANTIBODY: Hepatitis B virus surface Ab:PrThr:Pt:Ser:Ord:: NONREACTIVE

## 2019-08-21 LAB — HEPATITIS B SURFACE ANTIGEN: Hepatitis B virus surface Ag:PrThr:Pt:Ser:Ord:: NONREACTIVE

## 2019-08-21 NOTE — Unmapped (Signed)
Pt discharged to home per orders. Discharge instructions reviewed with patient and wife. Pt and wife verbalized understanding of discharged instructions. Pt tolerated HD well. VSS. Afebrile. No complaints of pain. Peripheral Ivs removed per orders. Pt walked to wheel chair and transport took patient to front of hospital for wife to take home. Pt phone, charger and other belongings sent home with patient. Right tunneled dialysis catheter remains intact for future HD treatments.    Problem: Adult Inpatient Plan of Care  Goal: Plan of Care Review  Outcome: Resolved  Goal: Patient-Specific Goal (Individualization)  Outcome: Resolved  Goal: Absence of Hospital-Acquired Illness or Injury  Outcome: Resolved  Goal: Optimal Comfort and Wellbeing  Outcome: Resolved  Goal: Readiness for Transition of Care  Outcome: Resolved  Goal: Rounds/Family Conference  Outcome: Resolved     Problem: Heart Failure Comorbidity  Goal: Maintenance of Heart Failure Symptom Control  Outcome: Resolved     Problem: Hypertension Comorbidity  Goal: Blood Pressure in Desired Range  Outcome: Resolved     Problem: Obstructive Sleep Apnea Risk or Actual (Comorbidity Management)  Goal: Unobstructed Breathing During Sleep  Outcome: Resolved     Problem: Device-Related Complication Risk (Hemodialysis)  Goal: Safe, Effective Therapy Delivery  Outcome: Resolved     Problem: Hemodynamic Instability (Hemodialysis)  Goal: Vital Signs Remain in Desired Range  Outcome: Resolved     Problem: Infection (Hemodialysis)  Goal: Absence of Infection Signs/Symptoms  Outcome: Resolved

## 2019-08-21 NOTE — Unmapped (Signed)
HEMODIALYSIS NURSE PROCEDURE NOTE       Treatment Number:  4 Room / Station:  Critical Care (Specify Unit & Room)    Procedure Date:  08/20/19 Device Name/Number: Payton    Total Dialysis Treatment Time:  125 Min.    CONSENT:    Written consent was obtained prior to the procedure and is detailed in the medical record.  Prior to the start of the procedure, a time out was taken and the identity of the patient was confirmed via name, medical record number and date of birth.     WEIGHT:  Hemodialysis Pre-Treatment Weights     Date/Time Pre-Treatment Weight (kg) Estimated Dry Weight (kg) Patient Goal Weight (kg) Total Goal Weight (kg)    08/20/19 1415  168.1 kg (370 lb 9.5 oz)  160 kg (352 lb 11.8 oz)  2 kg (4 lb 6.6 oz)  2.55 kg (5 lb 10 oz)         Active Dialysis Orders (168h ago, onward)     Start     Ordered    08/19/19 1742  Hemodialysis inpatient  Every Tue,Thu,Sat     Question Answer Comment   K+ 2 meq/L    Ca++ 2.5 meq/L    Bicarb 35 meq/L    Na+ 137 meq/L    Na+ Modeling No    Dialyzer F180NR    Dialysate Temperature (C) 36.5    BFR-As tolerated to a maximum of: 400 mL/min    DFR 800 mL/min    Duration of treatment 4 Hr    Dry weight (kg) 163kg    Challenge dry weight (kg) no    Fluid removal (L) to EDW    Tubing Adult = 142 ml    Access Site Dialysis Catheter    Access Site Location Right        08/19/19 1741              ASSESSMENT:  General appearance: alert  Neurologic: Grossly normal  Lungs: diminished breath sounds bilaterally  Heart: S1, S2 normal  Abdomen:soft, roundedACCESS SITE:       Hemodialysis Catheter 07/27/19 Right Internal jugular 2.2 mL 2.3 mL (Active)   Site Assessment Clean;Dry;Intact 08/20/19 1723   Proximal Lumen Status / Patency Capped 08/20/19 1723   Proximal Lumen Intervention Deaccessed 08/20/19 1723   Medial Lumen Status / Patency Capped 08/20/19 1723   Medial Lumen Intervention Deaccessed 08/20/19 1723   Dressing Intervention New dressing 08/20/19 1723   Dressing Status Clean;Dry 08/20/19 1723   Verification by X-ray Yes 08/20/19 1723   Site Condition No complications 08/20/19 1723   Dressing Type CHG gel;Occlusive 08/20/19 1723   Dressing Change Due 08/24/19 08/20/19 1723   Line Necessity Reviewed? Y 08/20/19 1723   Line Necessity Indications Yes - Hemodialysis 08/20/19 1723   Line Necessity Reviewed With Nephrology 08/20/19 1723           Catheter fill volumes:    Arterial: 2.2 mL Venous: 2.3 mL   Catheter filled with Gentamycin post procedure.      Patient Lines/Drains/Airways Status    Active Peripheral & Central Intravenous Access     Name:   Placement date:   Placement time:   Site:   Days:    Peripheral IV 08/18/19 Right Antecubital   08/18/19    1200    Antecubital   2    Peripheral IV 08/19/19 Anterior;Left Forearm   08/19/19    0127    Forearm  1               LAB RESULTS:  Lab Results   Component Value Date    NA 134 (L) 08/20/2019    K 4.7 08/20/2019    CL 104 08/20/2019    CO2 22.0 08/20/2019    BUN 33 (H) 08/20/2019    CREATININE 5.26 (H) 08/20/2019    GLU 220 (H) 08/20/2019    GLUF 223 (H) 06/09/2014    CALCIUM 7.7 (L) 08/20/2019    CAION 4.53 07/02/2018    PHOS 4.3 08/20/2019    MG 1.8 08/20/2019    PTH 403.4 (H) 07/25/2019    IRON 32 (L) 07/25/2019    LABIRON 11 (L) 07/25/2019    TRANSFERRIN 223.8 07/25/2019    FERRITIN 150.0 07/25/2019    TIBC 282.0 07/25/2019     Lab Results   Component Value Date    WBC 8.9 08/20/2019    HGB 8.9 (L) 08/20/2019    HCT 28.0 (L) 08/20/2019    PLT 258 08/20/2019    PHART 7.31 (L) 04/07/2017    PO2ART 124.0 (H) 04/07/2017    PCO2ART 44.3 04/07/2017    HCO3ART 22 04/07/2017    BEART -3.4 (L) 04/07/2017    O2SATART 98.9 04/07/2017    APTT 43.9 (H) 08/19/2019        VITAL SIGNS:   Temperature     Date/Time Temp Temp src      08/20/19 1722  36.7 ??C (98 ??F)  Oral     08/20/19 1715  ???  ???     08/20/19 1430  37.1 ??C (98.8 ??F)  Oral         Hemodynamics     Date/Time Pulse BP MAP (mmHg) Patient Position    08/20/19 1722  74  119/79  ???  Lying 08/20/19 1715  ???  145/82  ???  ???    08/20/19 1700  77  153/82  104  Lying    08/20/19 1645  76  149/73  ???  Lying    08/20/19 1630  73  154/77  ???  Lying    08/20/19 1615  ???  ???  ???  Lying    08/20/19 1600  73  170/82  111  ???    08/20/19 1545  76  145/83  ???  Lying    08/20/19 1515  76  149/78  ???  Lying    08/20/19 1510  75  149/78  ???  Lying    08/20/19 1501  ???  ???  ???  ???    08/20/19 1500  75  138/70  91  ???    08/20/19 1430  77  138/70  ???  Lying    08/20/19 1400  71  132/68  90  ???    08/20/19 1300  74  124/105  111  ???        Blood Volume Monitor     Date/Time Blood Volume Change (%) HCT HGB Critline O2 SAT %    08/20/19 1715  -8.8 %  27.6  9.4  62.8    08/20/19 1700  -8.7 %  27.5  9.3  60.7    08/20/19 1645  -7.8 %  27.2  9.3  64.2    08/20/19 1630  -6.8 %  26.9  9.2  65.4    08/20/19 1615  -7.2 %  27.1  9.2  67.3    08/20/19 1545  -6.4 %  26.7  9.1  66    08/20/19 1515  -3.4 %  26  8.9  65.4    08/20/19 1510  0 %  31  10.5  66.2    08/20/19 1501  ???  ???  ???  ???        Oxygen Therapy     Date/Time Resp SpO2 O2 Device O2 Flow Rate (L/min)    08/20/19 1722  17  97 %  ???  ???    08/20/19 1715  ???  95 %  ???  ???    08/20/19 1700  14  99 %  None (Room air)  ???    08/20/19 1645  18  96 %  None (Room air)  ???    08/20/19 1630  16  96 %  None (Room air)  ???    08/20/19 1600  19  93 %  ???  ???    08/20/19 1545  22  95 %  ???  ???    08/20/19 1515  16  96 %  ???  ???    08/20/19 1510  20  95 %  ???  ???    08/20/19 1501  ???  94 %  ???  ???    08/20/19 1500  20  94 %  None (Room air)  ???    08/20/19 1430  14  97 %  ???  ???    08/20/19 1400  20  95 %  ???  ???    08/20/19 1300  22  93 %  ???  ???          Pre-Hemodialysis Assessment     Date/Time Therapy Number Dialyzer Hemodialysis Line Type All Machine Alarms Passed    08/20/19 1415  4  F-180 (98 mLs)  Adult (142 m/s)  Yes    Date/Time Air Detector Saline Line Double Clampled Hemo-Safe Applied Dialysis Flow (mL/min)    08/20/19 1415  Engaged  ???  ???  800 mL/min    Date/Time Verify Priming Solution Priming Volume Hemodialysis Independent pH Hemodialysis Machine Conductivity (mS/cm)    08/20/19 1415  0.9% NS  300 mL  ???  ???    Date/Time Hemodialysis Independent Conductivity (mS/cm) Bicarb Conductivity Residual Bleach Negative Total Chlorine    08/20/19 1415  ??? --  Yes  0        Pre-Hemodialysis Treatment Comments     Date/Time Pre-Hemodialysis Comments    08/20/19 1415  alert, nad        Hemodialysis Treatment     Date/Time Blood Flow Rate (mL/min) Arterial Pressure (mmHg) Venous Pressure (mmHg) Transmembrane Pressure (mmHg)    08/20/19 1715  200 mL/min  -60 mmHg  60 mmHg  50 mmHg    08/20/19 1700  400 mL/min  -140 mmHg  140 mmHg  60 mmHg    08/20/19 1645  400 mL/min  -140 mmHg  140 mmHg  60 mmHg    08/20/19 1630  400 mL/min  -130 mmHg  140 mmHg  60 mmHg    08/20/19 1615  400 mL/min  -130 mmHg  130 mmHg  60 mmHg    08/20/19 1545  400 mL/min  -130 mmHg  130 mmHg  60 mmHg    08/20/19 1515  400 mL/min  -120 mmHg  120 mmHg  60 mmHg    08/20/19 1510  200 mL/min  -60 mmHg  60 mmHg  60 mmHg    08/20/19 1501  ???  ???  ???  ???  Date/Time Ultrafiltration Rate (mL/hr) Ultrafiltrate Removed (mL) Dialysate Flow Rate (mL/min) KECN (Kecn)    08/20/19 1715  1280 mL/hr  2537 mL  800 ml/min  ???    08/20/19 1700  1280 mL/hr  2348 mL  800 ml/min  ???    08/20/19 1645  1280 mL/hr  2083 mL  800 ml/min  ???    08/20/19 1630  1280 mL/hr  1898 mL  800 ml/min  ???    08/20/19 1615  1280 mL/hr  1425 mL  800 ml/min  ???    08/20/19 1545  1280 mL/hr  786 mL  800 ml/min  ???    08/20/19 1515  1280 mL/hr  401 mL  800 ml/min  ???    08/20/19 1510  640 mL/hr  0 mL  800 ml/min  ???    08/20/19 1501  ???  ???  ???  ???        Hemodialysis Treatment Comments     Date/Time Intra-Hemodialysis Comments    08/20/19 1722  Post HD    08/20/19 1715  alert, nad    08/20/19 1700  alert, stable    08/20/19 1645  alert, nad    08/20/19 1630  alert, talking with Wife    08/20/19 1615  alert, nad    08/20/19 1545  sleeping, nad    08/20/19 1515  alert, wife at bedside    08/20/19 1510  alert, tx started    08/20/19 1501  ???    08/20/19 1430  Pre HD        Post Treatment     None          Hemodialysis I/O     None          [phone] - Medicaitons Given During Treatment  (last 5 hrs)         BENTLEY E SPIVAK, RN       Medication Name Action Time Action Route Rate Dose User     insulin lispro (HumaLOG) injection 0-12 Units 08/20/19 1406 Given Subcutaneous  4 Units Chapman Moss, RN                  Patient tolerated treatment in a   .

## 2019-08-21 NOTE — Unmapped (Signed)
[facility] Nephrology Hemodialysis Procedure Note     08/20/2019    Jeremy Oliver was seen and examined on hemodialysis    CHIEF COMPLAINT: End Stage Renal Disease    INTERVAL HISTORY: No dyspnea or chest pain.    DIALYSIS TREATMENT DATA:  Estimated Dry Weight (kg): 160 kg (352 lb 11.8 oz)  Patient Goal Weight (kg): 2 kg (4 lb 6.6 oz)  Dialyzer: F-180 (98 mLs)  Dialysis Bath  Bath: 2 K+ / 2.5 Ca+  Dialysate Na (mEq/L): 137 mEq/L  Dialysate HCO3 (mEq/L): 31 mEq/L  Dialysate Total Buffer HCO3 (mEq/L): 35 mEq/L  Blood Flow Rate (mL/min): 200 mL/min  Dialysis Flow (mL/min): 800 mL/min    PHYSICAL EXAM:  Vitals:  Temp:  [36.6 ??C (97.9 ??F)-37.3 ??C (99.1 ??F)] 36.7 ??C (98 ??F)  Heart Rate:  [70-85] 74  SpO2 Pulse:  [70-85] 72  BP: (98-170)/(24-105) 119/79  MAP (mmHg):  [50-111] 104  Weights:  Pre-Treatment Weight (kg): 168.1 kg (370 lb 9.5 oz)    General: in no acute distress, currently dialyzing in a bed/stretcher  Pulmonary: decreased BS bases  Cardiovascular: regular rate and rhythm  Extremities: 1+  edema  Access: Right IJ tunneled catheter     LAB DATA:  Lab Results   Component Value Date    NA 134 (L) 08/20/2019    K 4.7 08/20/2019    CL 104 08/20/2019    CO2 22.0 08/20/2019    BUN 33 (H) 08/20/2019    CREATININE 5.26 (H) 08/20/2019    CALCIUM 7.7 (L) 08/20/2019    MG 1.8 08/20/2019    PHOS 4.3 08/20/2019    ALBUMIN 2.6 (L) 08/18/2019      Lab Results   Component Value Date    HCT 28.0 (L) 08/20/2019    WBC 8.9 08/20/2019        ASSESSMENT/PLAN:  End Stage Renal Disease on Intermittent Hemodialysis:  UF goal: 2 L as tolerated  Adjust medications for a GFR <10  Avoid nephrotoxic agents     Bone Mineral Metabolism:  Lab Results   Component Value Date    CALCIUM 7.7 (L) 08/20/2019    CALCIUM 7.7 (L) 08/20/2019    Lab Results   Component Value Date    ALBUMIN 2.6 (L) 08/18/2019    ALBUMIN 3.3 (L) 07/25/2019      Lab Results   Component Value Date    PHOS 4.3 08/20/2019    PHOS 4.3 08/20/2019    Lab Results   Component Value Date PTH 403.4 (H) 07/25/2019    PTH 395.4 (H) 04/14/2019      Continue phosphorus binder and dietary counseling.    Anemia:   Lab Results   Component Value Date    HGB 8.9 (L) 08/20/2019    HGB 9.7 (L) 08/19/2019    HGB 9.4 (L) 08/19/2019    Iron Saturation (%)   Date Value Ref Range Status   07/25/2019 11 (L) 20 - 50 % Final      Lab Results   Component Value Date    FERRITIN 150.0 07/25/2019       Anemia labs appropriate, no changes.    Gentry Fitz, MD  [facility] Division of Nephrology & Hypertension   08/20/2019

## 2019-08-22 DIAGNOSIS — I5042 Chronic combined systolic (congestive) and diastolic (congestive) heart failure: Principal | ICD-10-CM

## 2019-08-22 DIAGNOSIS — Z992 Dependence on renal dialysis: Principal | ICD-10-CM

## 2019-08-22 DIAGNOSIS — E1143 Type 2 diabetes mellitus with diabetic autonomic (poly)neuropathy: Principal | ICD-10-CM

## 2019-08-22 DIAGNOSIS — I10 Essential (primary) hypertension: Principal | ICD-10-CM

## 2019-08-22 DIAGNOSIS — E875 Hyperkalemia: Principal | ICD-10-CM

## 2019-08-22 DIAGNOSIS — N186 End stage renal disease: Principal | ICD-10-CM

## 2019-08-23 DIAGNOSIS — Z992 Dependence on renal dialysis: Principal | ICD-10-CM

## 2019-08-23 DIAGNOSIS — N186 End stage renal disease: Principal | ICD-10-CM

## 2019-08-23 DIAGNOSIS — I5042 Chronic combined systolic (congestive) and diastolic (congestive) heart failure: Principal | ICD-10-CM

## 2019-08-23 DIAGNOSIS — E1143 Type 2 diabetes mellitus with diabetic autonomic (poly)neuropathy: Principal | ICD-10-CM

## 2019-08-23 DIAGNOSIS — E875 Hyperkalemia: Principal | ICD-10-CM

## 2019-08-23 DIAGNOSIS — I10 Essential (primary) hypertension: Principal | ICD-10-CM

## 2019-08-23 NOTE — Unmapped (Signed)
Jeremy Oliver 's praluent shipment will be canceled  as a result of the medication is too soon to refill until 09/19/19.

## 2019-08-24 DIAGNOSIS — Z992 Dependence on renal dialysis: Principal | ICD-10-CM

## 2019-08-24 DIAGNOSIS — I10 Essential (primary) hypertension: Principal | ICD-10-CM

## 2019-08-24 DIAGNOSIS — E875 Hyperkalemia: Principal | ICD-10-CM

## 2019-08-24 DIAGNOSIS — I5042 Chronic combined systolic (congestive) and diastolic (congestive) heart failure: Principal | ICD-10-CM

## 2019-08-24 DIAGNOSIS — E1143 Type 2 diabetes mellitus with diabetic autonomic (poly)neuropathy: Principal | ICD-10-CM

## 2019-08-24 DIAGNOSIS — Z09 Encounter for follow-up examination after completed treatment for conditions other than malignant neoplasm: Principal | ICD-10-CM

## 2019-08-24 DIAGNOSIS — N186 End stage renal disease: Principal | ICD-10-CM

## 2019-08-25 DIAGNOSIS — Z992 Dependence on renal dialysis: Principal | ICD-10-CM

## 2019-08-25 DIAGNOSIS — I5042 Chronic combined systolic (congestive) and diastolic (congestive) heart failure: Principal | ICD-10-CM

## 2019-08-25 DIAGNOSIS — N186 End stage renal disease: Principal | ICD-10-CM

## 2019-08-25 DIAGNOSIS — E1143 Type 2 diabetes mellitus with diabetic autonomic (poly)neuropathy: Principal | ICD-10-CM

## 2019-08-25 DIAGNOSIS — E875 Hyperkalemia: Principal | ICD-10-CM

## 2019-08-25 DIAGNOSIS — I10 Essential (primary) hypertension: Principal | ICD-10-CM

## 2019-08-26 NOTE — Unmapped (Signed)
Addended by: Barbette Or B on: 08/26/2019 11:08 AM     Modules accepted: Orders

## 2019-08-26 NOTE — Unmapped (Signed)
INTERNAL MEDICINE COUNSELING SESSION NOTE    LENGTH OF SESSION:  Psychotherapy Session 35 minutes    TYPE OF PSYCHOTHERAPY: Behavioral, Supportive    REASON FOR TREATMENT: Problem Solving Treatment and Mind Body Stress Reduction skill training for adjustment reaction with depression and anxiety, stress and self-care management; link impact of effort to mood.             I spent 35 minutes on the real-time audio and video with the patient on the date of service. I spent an additional 5 minutes on pre- and post-visit activities on the date of service.     The patient was physically located in [place] or a state in which I am permitted to provide care. The patient and/or parent/guardian understood that s/he [place] incur co-pays and cost sharing, and agreed to the telemedicine visit. The visit was reasonable and appropriate under the circumstances given the patient's presentation at the time.    The patient and/or parent/guardian has been advised of the potential risks and limitations of this mode of treatment (including, but not limited to, the absence of in-person examination) and has agreed to be treated using telemedicine. The patient's/patient's family's questions regarding telemedicine have been answered.     If the visit was completed in an ambulatory setting, the patient and/or parent/guardian has also been advised to contact their provider???s office for worsening conditions, and seek emergency medical treatment and/or call 911 if the patient deems either necessary.             REVIEW OF FUNCTIONING SINCE LAST VISIT    MOOD (0-10) =  I'm hanging in there. I just came home from dialysis. 10 of 10 with 10 being the  best.     SATISFACTION WITH EFFORT IN CARING FOR MOOD (0-10) =   10 of 10 with 10 being the  best.     PAIN:     5  of 10 with 10 being the worst related to leg pain.  At last visit was    3    PHQ-9 SCORE:     6   At last visit was  14    GAD-7 SCORE:     6  At last visit was    13      CURRENT SUICIDAL/HOMICIDAL IDEATION:   None      PROBLEM LIST:    Patient Active Problem List   Diagnosis   ??? 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 Idiopathic chronic gout of left knee   ??? Hypercholesteremia   ??? Diabetes mellitus with coincident hypertension (CMS-[name])   ??? 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 Osteoarthritis   ??? Chronic nonalcoholic liver disease   ??? GERD (gastroesophageal reflux disease)   ??? Mild intermittent asthma without complication   ??? Chronic pain syndrome   ??? Paroxysmal atrial fibrillation (CMS-[name])   ??? OSA (obstructive sleep apnea)   ??? Tobacco use disorder   ??? Status post cardiac pacemaker procedure   ??? Diabetes mellitus with gastroparesis (CMS-[name])   ??? Cardiac pacemaker in situ   ??? Recurrent major depressive disorder, in full remission (CMS-[name])   ??? Adjustment disorder with mixed anxiety and depressed mood   ??? Hyperkalemia   ??? Acquired left foot drop   ??? Decreased sensation of lower extremity, left   ??? Chronic anticoagulation   ???  Type 2 diabetes mellitus with stage 4 chronic kidney disease, with long-term current use of insulin (CMS-[name])   ??? Situational stress   ??? Cough   ??? QT prolongation   ??? ESRD needing dialysis (CMS-[name])   ??? Palliative care by specialist   ??? Angina pectoris (CMS-[name])   ??? Psychophysiological insomnia         MEDICATION COMPLIANCE:    Taking all medications regularly?  yes     Any changes in medication since last visit?  no     Any missed doses? no       Current Outpatient Medications on File Prior to Visit   Medication Sig Dispense Refill   ??? acetaminophen (TYLENOL) 500 MG tablet TAKE 2 TABLETS (1 000MG ) BY MOUTH EVERY 8 HOURS AS NEEDED FOR PAIN 180 tablet 0   ??? albuterol HFA 90 mcg/actuation inhaler Inhale 2 puffs into the lungs every 6 (six) hours as needed for wheezing or shortness of breath. 18 g 2   ??? alirocumab (PRALUENT) 150 mg/mL subcutaneous injection Inject the contents of 1 pen (150 mg total) under the skin every fourteen (14) days. 6 mL 3   ??? allopurinoL (ZYLOPRIM) 100 MG tablet Take 2 tablets (200 mg total) by mouth daily. 60 tablet 5   ??? amiodarone (PACERONE) 200 MG tablet TAKE 1 TABLET BY MOUTH ONCE DAILY 90 tablet 3   ??? amLODIPine (NORVASC) 10 MG tablet Take 10 mg by mouth daily.     ??? apixaban (ELIQUIS) 5 mg Tab Take 1 tablet (5 mg total) by mouth Two (2) times a day. 60 tablet 11   ??? atorvastatin (LIPITOR) 80 MG tablet Take 1 tablet (80 mg total) by mouth daily. 90 tablet 3   ??? blood sugar diagnostic Strp Disp to match current ins approved meter. Use to test blood sugar four times daily. E11.22, Z79.4 (Patient taking differently: Disp to match current ins approved meter. Use to test blood sugar five times daily. E11.22, Z79.4) 400 each 3   ??? blood-glucose meter kit Disp. blood glucose meter kit preferred by patient's insurance. Dx: Diabetes, E11.9 1 each 11   ??? carvediloL (COREG) 25 MG tablet Take 1 tablet (25 mg total) by mouth Two (2) times a day. HOLD morning dose on dialysis days 60 tablet 0   ??? cholecalciferol, vitamin D3 25 mcg, 1,000 units,, 1,000 unit (25 mcg) tablet Take 1 tablet (25 mcg total) by mouth daily. 100 tablet 11   ??? diclofenac sodium (VOLTAREN) 1 % gel Apply 2 grams topically Three (3) times a day. 180 g 11   ??? ezetimibe (ZETIA) 10 mg tablet TAKE 1 TABLET BY MOUTH ONCE DAILY 90 each 3   ??? FLUoxetine (PROZAC) 40 MG capsule Take 2 capsules (80 mg total) by mouth daily. (Patient taking differently: Take 80 mg by mouth nightly. ) 180 capsule 3   ??? gabapentin (NEURONTIN) 300 MG capsule Take 1 capsule (300 mg total) by mouth two (2) times a day. 180 capsule 3   ??? hydrOXYzine (ATARAX) 10 MG tablet Take 1 tablet (10 mg total) by mouth nightly as needed for anxiety. 30 tablet 6   ??? insulin ASPART (NOVOLOG FLEXPEN U-100 INSULIN) 100 unit/mL (3 mL) injection pen Inject 15 units under skin three times a day with meals 15 mL 0   ??? insulin detemir U-100 (LEVEMIR FLEXTOUCH U-100 INSULN) 100 unit/mL (3 mL) injection pen Inject 37 units total under the skin nightly 15 mL 3   ??? insulin syringe-needle U-100  0.3 mL 31 gauge x 5/16 Syrg Use to inject insulin twice daily with meals 60 each 0   ??? isosorbide mononitrate (IMDUR) 30 MG 24 hr tablet Take 1 tablet (30 mg total) by mouth daily. DO NOT TAKE on dialysis days 120 tablet 3   ??? lancets Misc Disp. Ins preferred lancets. Use to test blood sugar four times daily. E11.22, Z79.4 400 each 3   ??? loperamide (IMODIUM) 2 mg capsule Take 1 capsule (2 mg total) by mouth 4 (four) times a day as needed for diarrhea. 30 capsule 3   ??? miscellaneous medical supply (BLOOD PRESSURE CUFF) Misc Measure BP once a day or if any symptoms 1 each 0   ??? [EXPIRED] nicotine polacrilex (NICORETTE) 4 MG gum Apply 1 each (4 mg total) to cheek every hour as needed for smoking cessation. 110 each 0   ??? nitroglycerin (NITROSTAT) 0.4 MG SL tablet Place 1 tablet (0.4 mg total) under the tongue every five (5) minutes as needed for chest pain. 100 each 3   ??? omeprazole (PRILOSEC) 40 MG capsule Take 1 capsule (40 mg total) by mouth Two (2) times a day (30 minutes before a meal). 180 capsule 3   ??? semaglutide 0.25 mg or 0.5 mg(2 mg/1.5 mL) PnIj Inject 0.5 mg under the skin every seven (7) days. 4.5 mL 3   ??? sevelamer (RENVELA) 800 mg tablet TAKE 2 TABLETS (1600MG ) BY MOUTH THREE TIMES DAILY WITH MEALS 540 tablet 3   ??? ticagrelor (BRILINTA) 90 mg Tab TAKE 1 TABLET BY MOUTH TWICE DAILY 180 tablet 3   ??? UNIFINE PENTIPS 31 gauge x 5/16 (8 mm) Ndle USE WITH INSULIN AND VICTOZA 5 TIMES DAILY 400 each 3   ??? [DISCONTINUED] liraglutide (VICTOZA 2-PAK) 0.6 mg/0.1 mL (18 mg/3 mL) injection Inject 0.1 mL (0.6 mg total) under the skin daily. 3 mL 1   ??? [DISCONTINUED] ranolazine (RANEXA) 500 MG 12 hr tablet Take by mouth two (2) times a day. 60 tablet 0   ??? [DISCONTINUED] traZODone (DESYREL) 50 MG tablet Take 1 tablet (50 mg total) by mouth nightly. 90 tablet 3     No current facility-administered medications on file prior to visit.           SLEEPING:     too much, dialysis 3 days make him really tired  Sometimes able to sleep through the night  Some nights with interruptions  Today slept through all 4 hours of dialysis after medicine for feeling sick this morning      At assessment:  worse  # of hours of sleep: 3 hours I'm worried about everything.  # of hours in bed: after dialysis, 3 days a week, goes back to bed  [employer], uses electronics other activities in bed: yes, TV  Occasional cat naps        APPETITE:      Was vomiting today from 2am-7am.  He let team at dialysis center know  In general appetite is ok, but I don't eat as much as I used to  He says blood glucose is fine      At assessment:  DECREASED and WEIGHT LOSS of 30 lbs last week.   Caffeine:  2 socas with caffeine - in the morning and in the afternoon around 7pm  BGs: 150, some lows 98 and highs have been 240. Usually 150        SUBSTANCE USE:  No     At assessment:  ETOH:  I do not drink  Illicit:   None  Licit:   ??Chewing tobacco 1x a day. Nicorette  Chewing tobacco since age 80. Note EMR also indicates use of snuff. No??cigarette smoking          SESSION FOCUS:  Addressed:  1. Adjustment disorder with mixed anxiety and depressed mood    2. Situational stress        First visit since assessment.     Discussed efforts since last contact.  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 Adjustment disorder with mixed anxiety and depressed mood    2. Situational stress          Adjustment reaction with anxiety and depression, situational stress  Explored and discussed challenges of health issues and start of dialysis  Wife, Rosann Auerbach, is managing medicines and cooking for him.  Discussed option for travel to see grandson and local dialysis. He says that [facility] be a future consideration.  I'm fighting to be where I want to be at. He doesn't want to be as tired. He wants to be able to do some things around the house.  After doing a class about home dialysis, he and his wife decided to do dialysis from center for next 6 months  Wife is getting ready to go back to work. He would prefer she was home with him, but they need the money. He says he feels safe to be at home by himself.        Additional:       Medication adherence and barriers to the treatment plan have been addressed. Opportunities to optimize healthy behaviors have been discussed. Patient / caregiver voiced understanding.        ASSESSMENT      ENGAGEMENT: Patient presented a willingness to participate in treatment.  PARTICIPATION QUALITY:    Active  MOOD:   I'm hanging in there. I just came home from dialysis.     AFFECT:  Mildly reactive   MENTAL STATUS:    Alert at start of appointment, oriented, drowsy  MODES OF INTERVENTION used in this session:    Clarification, Exploration, Problem Solving, Support, Reframing or Week review         TREATMENT PLAN    Identified treatment goals: ??Get or keep PHQ / GAD under 10 points, stress and self-care management     Patient-stated health goal: get some of this stuff off my head, I'm trying to do what I can [including counseling] to heal myself.    Problem Solving Treatment and Mind Body Stress Reduction Skill Training:  Next visit will be   2 of up to 12 visits.      RCO in 3-4 weeks by [employer].

## 2019-08-27 NOTE — Unmapped (Signed)
Spoke to pt's wife, [name].  States pt is home doing well.  [facility]- Population Health  Transitions of Care    Person spoken with:pt    Conditions and Symptoms  New problems: port- for dialysis   Medications: no request today  Refill needed: No    Post-Acute Services  Home Health Services - yes   DME: requests Electric wheelchair that folds                                                 Upcoming appointments: yes, SW, PCP and cardiology   Barriers to care: Barriers to Care: Financial insecurity, DME request and Medication affordability         Additional Information/Plan:  Will continue to support as appropriate.     Provided the patient with the following information:  -  Same Day Clinic:  [phone].  8am-5pm M-F.   -  After Hours:  [phone].  A nurse will answer and can help you decide what kind of medication attention you need.  A doctor is also there to help  [facility]:  [phone].  If you are sick, but not injured:  9am-8pm Mon-Sun.  [address], [address], [place], [place] off of [address].  All walk-ins accepted.    [facility] at the [facility]:  [phone].  7am-9pm Mon-Fri; 12pm-5pm [address]  [address], [place] [address].  All walk-ins accepted.          Time Spent:8 min    Perlie Gold  [facility]  [phone]

## 2019-08-28 ENCOUNTER — Ambulatory Visit: Admit: 2019-08-28 | Discharge: 2019-08-29 | Payer: MEDICARE

## 2019-08-29 NOTE — Unmapped (Signed)
[name],  Based on the smart Phrase for the wheelchair, he [place] need F2F visit  For the wheelchair.  I don't know if he needs power wheelchair, scooter, or manual wheelchair?   Jeremy Oliver

## 2019-08-30 ENCOUNTER — Encounter: Admit: 2019-08-30 | Discharge: 2019-09-23 | Payer: MEDICARE

## 2019-08-30 ENCOUNTER — Telehealth: Admit: 2019-08-30 | Discharge: 2019-08-31 | Payer: MEDICARE | Attending: Clinical | Primary: Clinical

## 2019-08-31 DIAGNOSIS — F4320 Adjustment disorder, unspecified: Principal | ICD-10-CM

## 2019-08-31 DIAGNOSIS — E875 Hyperkalemia: Principal | ICD-10-CM

## 2019-08-31 DIAGNOSIS — I480 Paroxysmal atrial fibrillation: Principal | ICD-10-CM

## 2019-08-31 DIAGNOSIS — I5043 Acute on chronic combined systolic (congestive) and diastolic (congestive) heart failure: Principal | ICD-10-CM

## 2019-08-31 DIAGNOSIS — M21372 Foot drop, left foot: Principal | ICD-10-CM

## 2019-08-31 DIAGNOSIS — I25110 Atherosclerotic heart disease of native coronary artery with unstable angina pectoris: Principal | ICD-10-CM

## 2019-08-31 DIAGNOSIS — D509 Iron deficiency anemia, unspecified: Principal | ICD-10-CM

## 2019-08-31 DIAGNOSIS — I051 Rheumatic mitral insufficiency: Principal | ICD-10-CM

## 2019-08-31 DIAGNOSIS — E1122 Type 2 diabetes mellitus with diabetic chronic kidney disease: Principal | ICD-10-CM

## 2019-08-31 DIAGNOSIS — I161 Hypertensive emergency: Principal | ICD-10-CM

## 2019-08-31 DIAGNOSIS — M1030 Gout due to renal impairment, unspecified site: Principal | ICD-10-CM

## 2019-08-31 DIAGNOSIS — J4520 Mild intermittent asthma, uncomplicated: Principal | ICD-10-CM

## 2019-08-31 DIAGNOSIS — K769 Liver disease, unspecified: Principal | ICD-10-CM

## 2019-08-31 DIAGNOSIS — I110 Hypertensive heart disease with heart failure: Principal | ICD-10-CM

## 2019-08-31 DIAGNOSIS — E1143 Type 2 diabetes mellitus with diabetic autonomic (poly)neuropathy: Principal | ICD-10-CM

## 2019-08-31 DIAGNOSIS — M1990 Unspecified osteoarthritis, unspecified site: Principal | ICD-10-CM

## 2019-08-31 DIAGNOSIS — N186 End stage renal disease: Principal | ICD-10-CM

## 2019-08-31 DIAGNOSIS — K3184 Gastroparesis: Principal | ICD-10-CM

## 2019-08-31 DIAGNOSIS — I21A1 Myocardial infarction type 2: Principal | ICD-10-CM

## 2019-08-31 DIAGNOSIS — G894 Chronic pain syndrome: Principal | ICD-10-CM

## 2019-08-31 DIAGNOSIS — J9601 Acute respiratory failure with hypoxia: Principal | ICD-10-CM

## 2019-08-31 DIAGNOSIS — F419 Anxiety disorder, unspecified: Principal | ICD-10-CM

## 2019-08-31 DIAGNOSIS — G4733 Obstructive sleep apnea (adult) (pediatric): Principal | ICD-10-CM

## 2019-08-31 MED FILL — BRILINTA 90 MG TABLET: ORAL | 90 days supply | Qty: 180 | Fill #2

## 2019-08-31 MED FILL — BRILINTA 90 MG TABLET: 90 days supply | Qty: 180 | Fill #2 | Status: AC

## 2019-08-31 MED FILL — FLUOXETINE 40 MG CAPSULE: ORAL | 90 days supply | Qty: 180 | Fill #2

## 2019-08-31 MED FILL — ALLOPURINOL 100 MG TABLET: 30 days supply | Qty: 60 | Fill #2 | Status: AC

## 2019-08-31 MED FILL — ELIQUIS 5 MG TABLET: ORAL | 30 days supply | Qty: 60 | Fill #0

## 2019-08-31 MED FILL — ATORVASTATIN 80 MG TABLET: ORAL | 90 days supply | Qty: 90 | Fill #2

## 2019-08-31 MED FILL — AMIODARONE 200 MG TABLET: 84 days supply | Qty: 84 | Fill #0 | Status: AC

## 2019-08-31 MED FILL — ELIQUIS 5 MG TABLET: 30 days supply | Qty: 60 | Fill #0 | Status: AC

## 2019-08-31 MED FILL — FLUOXETINE 40 MG CAPSULE: 90 days supply | Qty: 180 | Fill #2 | Status: AC

## 2019-08-31 MED FILL — ALLOPURINOL 100 MG TABLET: ORAL | 30 days supply | Qty: 60 | Fill #2

## 2019-08-31 MED FILL — ISOSORBIDE MONONITRATE ER 30 MG TABLET,EXTENDED RELEASE 24 HR: 90 days supply | Qty: 90 | Fill #0 | Status: AC

## 2019-08-31 MED FILL — ATORVASTATIN 80 MG TABLET: 90 days supply | Qty: 90 | Fill #2 | Status: AC

## 2019-09-02 DIAGNOSIS — I110 Hypertensive heart disease with heart failure: Principal | ICD-10-CM

## 2019-09-02 DIAGNOSIS — I25110 Atherosclerotic heart disease of native coronary artery with unstable angina pectoris: Principal | ICD-10-CM

## 2019-09-02 DIAGNOSIS — D509 Iron deficiency anemia, unspecified: Principal | ICD-10-CM

## 2019-09-02 DIAGNOSIS — I5043 Acute on chronic combined systolic (congestive) and diastolic (congestive) heart failure: Principal | ICD-10-CM

## 2019-09-02 DIAGNOSIS — M21372 Foot drop, left foot: Principal | ICD-10-CM

## 2019-09-02 DIAGNOSIS — I051 Rheumatic mitral insufficiency: Principal | ICD-10-CM

## 2019-09-02 DIAGNOSIS — G4733 Obstructive sleep apnea (adult) (pediatric): Principal | ICD-10-CM

## 2019-09-02 DIAGNOSIS — I480 Paroxysmal atrial fibrillation: Principal | ICD-10-CM

## 2019-09-02 DIAGNOSIS — G894 Chronic pain syndrome: Principal | ICD-10-CM

## 2019-09-02 DIAGNOSIS — E875 Hyperkalemia: Principal | ICD-10-CM

## 2019-09-02 DIAGNOSIS — F419 Anxiety disorder, unspecified: Principal | ICD-10-CM

## 2019-09-02 DIAGNOSIS — E1122 Type 2 diabetes mellitus with diabetic chronic kidney disease: Principal | ICD-10-CM

## 2019-09-02 DIAGNOSIS — J4520 Mild intermittent asthma, uncomplicated: Principal | ICD-10-CM

## 2019-09-02 DIAGNOSIS — E1143 Type 2 diabetes mellitus with diabetic autonomic (poly)neuropathy: Principal | ICD-10-CM

## 2019-09-02 DIAGNOSIS — F4320 Adjustment disorder, unspecified: Principal | ICD-10-CM

## 2019-09-02 DIAGNOSIS — M1030 Gout due to renal impairment, unspecified site: Principal | ICD-10-CM

## 2019-09-02 DIAGNOSIS — J9601 Acute respiratory failure with hypoxia: Principal | ICD-10-CM

## 2019-09-02 DIAGNOSIS — M1990 Unspecified osteoarthritis, unspecified site: Principal | ICD-10-CM

## 2019-09-02 DIAGNOSIS — I21A1 Myocardial infarction type 2: Principal | ICD-10-CM

## 2019-09-02 DIAGNOSIS — K769 Liver disease, unspecified: Principal | ICD-10-CM

## 2019-09-02 DIAGNOSIS — K3184 Gastroparesis: Principal | ICD-10-CM

## 2019-09-02 DIAGNOSIS — N186 End stage renal disease: Principal | ICD-10-CM

## 2019-09-02 DIAGNOSIS — I161 Hypertensive emergency: Principal | ICD-10-CM

## 2019-09-05 DIAGNOSIS — E1143 Type 2 diabetes mellitus with diabetic autonomic (poly)neuropathy: Principal | ICD-10-CM

## 2019-09-05 DIAGNOSIS — I051 Rheumatic mitral insufficiency: Principal | ICD-10-CM

## 2019-09-05 DIAGNOSIS — E875 Hyperkalemia: Principal | ICD-10-CM

## 2019-09-05 DIAGNOSIS — K3184 Gastroparesis: Principal | ICD-10-CM

## 2019-09-05 DIAGNOSIS — K769 Liver disease, unspecified: Principal | ICD-10-CM

## 2019-09-05 DIAGNOSIS — I25110 Atherosclerotic heart disease of native coronary artery with unstable angina pectoris: Principal | ICD-10-CM

## 2019-09-05 DIAGNOSIS — J9601 Acute respiratory failure with hypoxia: Principal | ICD-10-CM

## 2019-09-05 DIAGNOSIS — E1122 Type 2 diabetes mellitus with diabetic chronic kidney disease: Principal | ICD-10-CM

## 2019-09-05 DIAGNOSIS — I110 Hypertensive heart disease with heart failure: Principal | ICD-10-CM

## 2019-09-05 DIAGNOSIS — I161 Hypertensive emergency: Principal | ICD-10-CM

## 2019-09-05 DIAGNOSIS — I480 Paroxysmal atrial fibrillation: Principal | ICD-10-CM

## 2019-09-05 DIAGNOSIS — M1030 Gout due to renal impairment, unspecified site: Principal | ICD-10-CM

## 2019-09-05 DIAGNOSIS — N186 End stage renal disease: Principal | ICD-10-CM

## 2019-09-05 DIAGNOSIS — I21A1 Myocardial infarction type 2: Principal | ICD-10-CM

## 2019-09-05 DIAGNOSIS — F4320 Adjustment disorder, unspecified: Principal | ICD-10-CM

## 2019-09-05 DIAGNOSIS — F419 Anxiety disorder, unspecified: Principal | ICD-10-CM

## 2019-09-05 DIAGNOSIS — G4733 Obstructive sleep apnea (adult) (pediatric): Principal | ICD-10-CM

## 2019-09-05 DIAGNOSIS — M21372 Foot drop, left foot: Principal | ICD-10-CM

## 2019-09-05 DIAGNOSIS — M1990 Unspecified osteoarthritis, unspecified site: Principal | ICD-10-CM

## 2019-09-05 DIAGNOSIS — G894 Chronic pain syndrome: Principal | ICD-10-CM

## 2019-09-05 DIAGNOSIS — J4520 Mild intermittent asthma, uncomplicated: Principal | ICD-10-CM

## 2019-09-05 DIAGNOSIS — I5043 Acute on chronic combined systolic (congestive) and diastolic (congestive) heart failure: Principal | ICD-10-CM

## 2019-09-05 DIAGNOSIS — D509 Iron deficiency anemia, unspecified: Principal | ICD-10-CM

## 2019-09-06 NOTE — Unmapped (Signed)
Need to schedule 4 weeks follow up with Jeremy Oliver. Patient states he was at his dyalisis appt.    Check out comments: RCO in 3-4 weeks by Doximity.     Saddie Benders  09/06/19

## 2019-09-07 ENCOUNTER — Ambulatory Visit: Admit: 2019-09-07 | Discharge: 2019-09-08 | Payer: MEDICARE

## 2019-09-07 DIAGNOSIS — M21372 Foot drop, left foot: Principal | ICD-10-CM

## 2019-09-07 DIAGNOSIS — I5042 Chronic combined systolic (congestive) and diastolic (congestive) heart failure: Principal | ICD-10-CM

## 2019-09-07 DIAGNOSIS — Z992 Dependence on renal dialysis: Secondary | ICD-10-CM

## 2019-09-07 DIAGNOSIS — N186 End stage renal disease: Principal | ICD-10-CM

## 2019-09-07 DIAGNOSIS — F410 Panic disorder [episodic paroxysmal anxiety] without agoraphobia: Principal | ICD-10-CM

## 2019-09-07 DIAGNOSIS — F4323 Adjustment disorder with mixed anxiety and depressed mood: Principal | ICD-10-CM

## 2019-09-07 NOTE — Unmapped (Signed)
[facility]  Established Visit    Reason for Visit:  Adjustment disorder HF, HFrEF, ESRD on HD    1. ESRD needing dialysis (CMS-[name])    2. Acquired left foot drop    3. Chronic combined systolic and diastolic HF (heart failure) (CMS-[name])    4. Panic disorder    5. Adjustment disorder with mixed anxiety and depressed mood        Assessment/Plans:  1. ESRD needing dialysis (CMS-[name])  Seems to be tolerating dialysis but has fluctuating BPs.  Will continue current regimen, but hold on dialysis days    2. Acquired left foot drop  Not currently able to wear brace as it caused large blister on bottom of foot.  PMR eval for appropriate brace to use  - Physicial Medicine Rehab; Future    3. Chronic combined systolic and diastolic HF (heart failure) (CMS-[name])  Has f/u with Dr. Andrey Farmer tomorrow.  Last Echo 09/2018 with EF 35%. Continue current regimen and stressed HTN control even on days he does not have dialysis (did not take meds today).  Hypotension during HD managed by not taking anti-HTN meds on HD days.    Unclear if he is eligible for cardiac rehab program, but will touch base with Dr. Andrey Farmer.  - will defer to Dr. Andrey Farmer about timing of next ECHO  It is unclear to me if he is truly eligible for power wheelchair. Ideally would have him maintain as much physical activity, even walking throughout home or store, as much as possible, but referred to PMR for evaluation.    - Physicial Medicine Rehab; Future    Adjustment disorder with depression and anxiety  Although wife initially states she wouldn't give Jeremy Oliver any more Prozac, we discuss how he has been on this medication and at this dose for year.  I do not think this episode is related to the medication but rather acute worsening of adjustment disorder vs. Panic attack. There is fear that would happen again as he has no recollection of this.  Discussed counseling, addition of medication.  At this time, would defer treatment of medication, but open to discussion options of what would be effective in context of his complex medical conditions.   - patient agreeable to psych consult clinic  - continue Prozac 80mg  daily    Return in about 4 weeks (around 10/05/2019). face to face    Medication adherence and barriers to the treatment plan have been addressed. Opportunities to optimize healthy behaviors have been discussed. Patient / caregiver voiced understanding.        __________________________________________________________    HPI:    Gave meds on Monday night, then 4am on Tues woke up from sleep screaming, cussing, telling wife to get out of house.  No prior episodes like this.  Jeremy Oliver has no recollection of this episode or taking meds that night until 8:30am the next morning.  No recent changes in mediications.  HD has instructed not to take BP meds or isosorbide on day of dialysis.  Jeremy Oliver and Jeremy Oliver lived in [place] and they are Jeremy Oliver and Jeremy Oliver, but put up for adoption when the were young . Has not had relationship with them and have not known about their whereabouts. Last week, got call from [facility] and was informed that [name] passed away in 2007-10-15.  Had mental health issues and died in house fire.  Jeremy Oliver plans to come to [place] to share other details and to meet them.  Daughter  Jeremy Oliver is also getting married in September.  Trying to cope with a recommendation he heard he says from Dr. Andrey Farmer that electric wheelchair is need and feels like it's a verdict to just sit and die in his chair.  Has had discussion about need for more permanent HD access in his arm, but has heard that it will make it worse for his heart function. States they heard that EF is worse at 20%.        Still having issues with low BP at dialysis. Yesterday able to pull off some fluid, 2L per his report.        Medications:  Reviewed in EPIC      Physical Exam:   Vital Signs:   BP 171/90 Comment: average - Pulse 92 Comment: average - Temp 36.5 ??C (97.7 ??F) (Temporal)  - Ht 188 cm (6' 2)  - Wt (!) 163.4 kg (360 lb 3.7 oz)  - SpO2 98%  - BMI 46.25 kg/m??   Gen: chronically ill appearing, NAD  CV: RRR, no murmurs  Pulm: CTA bilaterally, no crackles or wheezes  MSK: 1+ edema of feet  Psych:  Tearful and visibly strained when discussing recent life changes and stressors

## 2019-09-07 NOTE — Unmapped (Deleted)
[facility] - [facility]  Established Visit    Reason for Visit:  ***    No diagnosis found.    Assessment/Plans:      No follow-ups on file. {Telephone Video Face to face:71720}    Medication adherence and barriers to the treatment plan have been addressed. Opportunities to optimize healthy behaviors have been discussed. Patient / caregiver voiced understanding.        __________________________________________________________    HPI:    Gave meds on Monday night, then 4am on Tues woke up from sleep screaming, cussing, telling wife to get out of house.  No prior episodes like this.  Jeremy Oliver has no recollection of this episode or taking meds that night until 8:30am the next morning.  No recent changes in mediications.  HD has instructed not to take BP meds or isosorbide on day of dialysis.  Jeremy Oliver and Jeremy Oliver lived in [place] and they are Jeremy Oliver and Jeremy Oliver, but put up for adoption.         Medications:  Reviewed in EPIC      Physical Exam:   Vital Signs:   Wt (!) 163.4 kg (360 lb 3.7 oz)  - BMI 46.23 kg/m??   Gen: Well appearing, NAD  CV: RRR, no murmurs  Pulm: CTA bilaterally, no crackles or wheezes  GI: Soft, NTND, normal BS. No HSM.  MSK: No edema

## 2019-09-07 NOTE — Unmapped (Signed)
[facility] at [facility]       Type of visit:  face to face    Reason for visit: follow-up    Questions / Concerns that need to be addressed: Leg pain        Omron BPs (complete if screening BP has a systolic  > 139 or diastolic > 89)  BP#1 176/99   BP#2 175/95  BP#3 163/75    Average BP 171/90  (please note this as a comment in vitals)         Allergies reviewed: Yes    Medication reviewed: Yes  Pended refills? No        [name] reviewed and updated in Epic:    We are working to make sure all of our patients??? wishes are updated in Epic and part of that is documenting a Environmental health practitioner for each patient  A Health Care Decision Rodena Piety is someone you choose who can make health care decisions for you if you are not able ??? who would you most want to do this for you????  is already up to date.              COVID-19 Vaccine Summary  Type: Linwood Dibbles  Dates Given:09/06/19                   Immunization History   Administered Date(s) Administered   ??? INFLUENZA TIV (TRI) PF (IM) 12/09/2007, 12/27/2008, 01/09/2011   ??? Influenza Vaccine Quad (IIV4 PF) 24mo+ injectable 01/09/2012, 12/06/2012, 03/02/2014, 12/21/2014, 12/05/2015, 12/09/2016, 12/04/2017, 12/10/2018   ??? Novel Influenza-h1n1-09, All Formulations 03/13/2008   ??? PNEUMOCOCCAL POLYSACCHARIDE 23 02/10/2007   ??? TdaP 08/30/2009       __________________________________________________________________________________________    SCREENINGS COMPLETED IN FLOWSHEETS

## 2019-09-07 NOTE — Unmapped (Addendum)
I put in referral for Physical Medicine and Rehab doctors for evaluation for eligibility and evaluation for an electric wheelchair in addition to a brace for your left foot for the drop foot.     Please schedule follow-up with Diane for counseling.     Continue to take Fluoxetine 80mg  once a day.

## 2019-09-07 NOTE — Unmapped (Signed)
Need to schedule 4 weeks follow up with Carmelia Bake. Patient states he was at his dyalisis appt.  ??  Check out comments: RCO in 3-4 weeks by Doximity.

## 2019-09-08 ENCOUNTER — Ambulatory Visit
Admit: 2019-09-08 | Discharge: 2019-09-09 | Payer: MEDICARE | Attending: Cardiovascular Disease | Primary: Cardiovascular Disease

## 2019-09-08 DIAGNOSIS — I5042 Chronic combined systolic (congestive) and diastolic (congestive) heart failure: Principal | ICD-10-CM

## 2019-09-08 DIAGNOSIS — I480 Paroxysmal atrial fibrillation: Principal | ICD-10-CM

## 2019-09-08 DIAGNOSIS — I25118 Atherosclerotic heart disease of native coronary artery with other forms of angina pectoris: Principal | ICD-10-CM

## 2019-09-08 DIAGNOSIS — I10 Essential (primary) hypertension: Principal | ICD-10-CM

## 2019-09-08 DIAGNOSIS — N186 End stage renal disease: Principal | ICD-10-CM

## 2019-09-08 DIAGNOSIS — Z992 Dependence on renal dialysis: Secondary | ICD-10-CM

## 2019-09-08 NOTE — Unmapped (Signed)
PCP:  Kurtis Bushman, MD    Patient ID: Jeremy Oliver is a 49 y.o. male.    Assessment and Plan:   1. Coronary artery disease involving native coronary artery of native heart without angina pectoris  2. Chronic heart failure with reduced ejection fraction and diastolic dysfunction (CMS-[name])  3. Paroxysmal atrial fibrillation (CMS-[name])  4. Essential hypertension (RAF-[name])  5. CKD 5    We will order echocardiogram to evaluate heart function. ECG was reassuring today.  There are not any additional options for PCI, given his living conditions and diet it is going to be very difficult to avoid fluid overload  We will continue current medical therapies. Can use Zofran prn for nausea but advised against regular scheduled use to avoid prolonged QT  He is otherwise on excellent medical therapy provided by Dr. Cherly Hensen and Dr. Hulan Fess we made no changes to his regimen today.  Blood pressure is reasonably well controlled.  Will need repeat lipid panel with next blood draw to assess response to Praluent.    Dictated using Animal nutritionist, please excuse typos    Elpidio Anis  MD [facility]  Division of Cardiology   [place] [facility] [phone]    Subjective:   Jeremy Oliver presents today for follow-up with his wife.  He was recently mated to the hospital on the nephrology service for placement of dialysis access and initiation of intermittent hemodialysis.  He has now been going to the dialysis center in [facility] and his symptoms of shortness of breath have improved.  He is also lost a significant amount of fluid weight and is feeling less short of breath at rest.      Jeremy Oliver returns for follow-up s/p PCI. His wife states this morning he was throwing up with chest pain for approximately 5 hours from 12am-5am. He had dialysis this morning and states he slept through dialysis. His wife gave him a nitroglycerin this morning. He endorses mild chest pain today and reports his anginal symptoms overall with walking are somewhat better, but functional status is not significantly improved after recent PCI.    His wife states he woke up in the middle of the night he woke up acting strangely, but patient does not remember that event. He has been using his CPAP machine nightly.      Wt Readings from Last 12 Encounters:   09/07/19 (!) 163.4 kg (360 lb 3.7 oz)   08/20/19 (!) 167.3 kg (368 lb 13.3 oz)   08/15/19 (!) 161.3 kg (355 lb 9.6 oz)   08/11/19 (!) 161 kg (355 lb)   08/05/19 (!) 163.3 kg (360 lb)   08/03/19 (!) 163.3 kg (360 lb)   07/30/19 (!) 164.8 kg (363 lb 5.1 oz)   07/25/19 (!) 164.4 kg (362 lb 6.4 oz)   07/22/19 (!) 166 kg (366 lb)   07/20/19 (!) 169.6 kg (373 lb 12.8 oz)   07/19/19 (!) 171 kg (377 lb)   07/15/19 (!) 173.7 kg (383 lb)         Patient Active Problem List   Diagnosis   ??? 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 Idiopathic chronic gout of left knee   ??? Hypercholesteremia   ??? Diabetes mellitus with coincident hypertension (CMS-[name])   ??? 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 Obesity, Class III,  BMI 40-49.9 (morbid obesity) (CMS-[name])   ??? Osteoarthritis   ??? Chronic nonalcoholic liver disease   ??? GERD (gastroesophageal reflux disease)   ??? Mild intermittent asthma without complication   ??? Chronic pain syndrome   ??? Paroxysmal atrial fibrillation (CMS-[name])   ??? OSA (obstructive sleep apnea)   ??? Tobacco use disorder   ??? Status post cardiac pacemaker procedure   ??? Diabetes mellitus with gastroparesis (CMS-[name])   ??? Cardiac pacemaker in situ   ??? Recurrent major depressive disorder, in full remission (CMS-[name])   ??? Adjustment disorder with mixed anxiety and depressed mood   ??? Hyperkalemia   ??? Acquired left foot drop   ??? Decreased sensation of lower extremity, left   ??? Chronic anticoagulation   ??? Type 2 diabetes mellitus with stage 4 chronic kidney disease, with long-term current use of insulin (CMS-[name])   ??? Situational stress   ??? Cough   ??? QT prolongation   ??? ESRD needing dialysis (CMS-[name])   ??? Palliative care by specialist   ??? Angina pectoris (CMS-[name])   ??? Psychophysiological insomnia     Most Recent Imaging:  [facility]HC  08/19/19  1. There is??significant 2-vessel coronary artery disease. ??There is a 90% proximal LAD stenosis which extends into the proximal-LAD stent and 80% proximal LCx stenosis. The mid-LAD stent has mild ISR. The mid and distal RCA stents are widely patent. The RCA has moderate diffuse distal disease.   2. Elevated??left ventricular filling pressures (LVEDP = 24??mm Hg).  3. Successful PCI to the??LAD and LCx??with the placement of a Synergy DES x2??with excellent angiographic result and TIMI 3 flow.  4. Rescue balloon angioplasty of the DIAG2 vessel that was occluded after dilation of the LAD stent    10/22/18  1.   Significant 90% stenosis of distal RCA, severe proximal LCx stenosis, and diffuse severe distal LAD stenosis  2.   Elevated LVEDP = 22   3.   Successful DES to the distal RCA with the placement of a Synergy 4.0 x 20 with excellent angiographic result and TIMI 3 flow.    05/12/18  5. Coronary artery disease including diffuse moderate to severe lesions in the left coronary system, with particular emphasis on the 90% proximal to mid LAD in-stent restenosis.  6. Successful PCI to the proximal to mid LAD with the placement of a Synergy drug eluting stent with excellent angiographic result and TIMI 3 flow.  7. Subsequent impaired flow at an adjacent moderate diagonal, which was small and had pre-existing ostial disease. Recrossed and treated with balloon angioplasty with restored flow.  8. Recent stent to RPDA/RPLA bifurcation widely patent.  9. Normal left ventricular filling pressures (LVEDP = 8 mm Hg).    03/12/18  1. Coronary artery disease including??severe mid RCA, and distal RCA bifurcation stenosis.  2. Left heart cath deferred  3. Successful??PCI??to the mid RCA and RPLA/RPDA bifurcation??with the placement of a Synergy DES x??with excellent angiographic result and TIMI 3 flow.    Echo 10/19/18  ?? Dilated left ventricle - mild  ?? Moderately decreased left ventricular systolic function, ejection fraction 35%  ?? Segmental wall motion abnormality - severe posterolateral hypokinesis  ?? Mitral regurgitation - mild  ?? Dilated left atrium - moderate  ?? Aortic sclerosis  ?? Normal right ventricular systolic function  ?? Technically difficult study due to chest wall/lung interference  ?? Ultrasound enhancing agent utilized to improve endocardial border definition  ?? Diastolic dysfunction - grade I (normal filling pressures)  ECG 07/28/19  SINUS RHYTHM WITH 1ST DEGREE AV BLOCK  T WAVE ABNORMALITY, CONSIDER LATERAL ISCHEMIA    Current Outpatient Medications   Medication Sig Dispense Refill   ??? acetaminophen (TYLENOL) 500 MG tablet TAKE 2 TABLETS (1 000MG ) BY MOUTH EVERY 8 HOURS AS NEEDED FOR PAIN 180 tablet 0   ??? albuterol HFA 90 mcg/actuation inhaler Inhale 2 puffs into the lungs every 6 (six) hours as needed for wheezing or shortness of breath. 18 g 2   ??? alirocumab (PRALUENT) 150 mg/mL subcutaneous injection Inject the contents of 1 pen (150 mg total) under the skin every fourteen (14) days. 6 mL 3   ??? allopurinoL (ZYLOPRIM) 100 MG tablet Take 2 tablets (200 mg total) by mouth daily. 60 tablet 5   ??? amiodarone (PACERONE) 200 MG tablet TAKE 1 TABLET BY MOUTH ONCE DAILY 90 tablet 3   ??? amLODIPine (NORVASC) 10 MG tablet Take 10 mg by mouth daily.     ??? apixaban (ELIQUIS) 5 mg Tab Take 1 tablet (5 mg total) by mouth Two (2) times a day. 60 tablet 11   ??? atorvastatin (LIPITOR) 80 MG tablet Take 1 tablet (80 mg total) by mouth daily. 90 tablet 3   ??? blood sugar diagnostic Strp Disp to match current ins approved meter. Use to test blood sugar four times daily. E11.22, Z79.4 (Patient taking differently: Disp to match current ins approved meter. Use to test blood sugar five times daily. E11.22, Z79.4) 400 each 3   ??? blood-glucose meter kit Disp. blood glucose meter kit preferred by patient's insurance. Dx: Diabetes, E11.9 1 each 11   ??? carvediloL (COREG) 25 MG tablet Take 1 tablet (25 mg total) by mouth Two (2) times a day. HOLD morning dose on dialysis days 60 tablet 0   ??? cholecalciferol, vitamin D3 25 mcg, 1,000 units,, 1,000 unit (25 mcg) tablet Take 1 tablet (25 mcg total) by mouth daily. 100 tablet 11   ??? diclofenac sodium (VOLTAREN) 1 % gel Apply 2 grams topically Three (3) times a day. 180 g 11   ??? ezetimibe (ZETIA) 10 mg tablet TAKE 1 TABLET BY MOUTH ONCE DAILY 90 each 3   ??? FLUoxetine (PROZAC) 40 MG capsule Take 2 capsules (80 mg total) by mouth daily. (Patient taking differently: Take 80 mg by mouth nightly. ) 180 capsule 3   ??? gabapentin (NEURONTIN) 300 MG capsule Take 1 capsule (300 mg total) by mouth two (2) times a day. 180 capsule 3   ??? hydrOXYzine (ATARAX) 10 MG tablet Take 1 tablet (10 mg total) by mouth nightly as needed for anxiety. 30 tablet 6   ??? insulin ASPART (NOVOLOG FLEXPEN U-100 INSULIN) 100 unit/mL (3 mL) injection pen Inject 15 units under skin three times a day with meals 15 mL 0   ??? insulin detemir U-100 (LEVEMIR FLEXTOUCH U-100 INSULN) 100 unit/mL (3 mL) injection pen Inject 37 units total under the skin nightly 15 mL 3   ??? insulin syringe-needle U-100 0.3 mL 31 gauge x 5/16 Syrg Use to inject insulin twice daily with meals 60 each 0   ??? isosorbide mononitrate (IMDUR) 30 MG 24 hr tablet Take 1 tablet (30 mg total) by mouth daily. DO NOT TAKE on dialysis days 120 tablet 3   ??? lancets Misc Disp. Ins preferred lancets. Use to test blood sugar four times daily. E11.22, Z79.4 400 each 3   ??? loperamide (IMODIUM) 2 mg capsule Take 1 capsule (2 mg total) by mouth 4 (four)  times a day as needed for diarrhea. 30 capsule 3   ??? miscellaneous medical supply (BLOOD PRESSURE CUFF) Misc Measure BP once a day or if any symptoms 1 each 0   ??? nitroglycerin (NITROSTAT) 0.4 MG SL tablet Place 1 tablet (0.4 mg total) under the tongue every five (5) minutes as needed for chest pain. 100 each 3   ??? omeprazole (PRILOSEC) 40 MG capsule Take 1 capsule (40 mg total) by mouth Two (2) times a day (30 minutes before a meal). 180 capsule 3   ??? semaglutide 0.25 mg or 0.5 mg(2 mg/1.5 mL) PnIj Inject 0.5 mg under the skin every seven (7) days. 4.5 mL 3   ??? sevelamer (RENVELA) 800 mg tablet TAKE 2 TABLETS (1600MG ) BY MOUTH THREE TIMES DAILY WITH MEALS 540 tablet 3   ??? ticagrelor (BRILINTA) 90 mg Tab TAKE 1 TABLET BY MOUTH TWICE DAILY 180 tablet 3   ??? UNIFINE PENTIPS 31 gauge x 5/16 (8 mm) Ndle USE WITH INSULIN AND VICTOZA 5 TIMES DAILY 400 each 3     No current facility-administered medications for this visit.       Allergies   Allergen Reactions   ??? Losartan      Hyperkalemia (K>6)   ??? Opioids - Morphine Analogues Anxiety       Review of Systems  Pertinent items are noted in HPI.      Objective:      There were no vitals filed for this visit.There is no height or weight on file to calculate BMI.    Wt Readings from Last 6 Encounters:   09/07/19 (!) 163.4 kg (360 lb 3.7 oz)   08/20/19 (!) 167.3 kg (368 lb 13.3 oz)   08/15/19 (!) 161.3 kg (355 lb 9.6 oz)   08/11/19 (!) 161 kg (355 lb)   08/05/19 (!) 163.3 kg (360 lb)   08/03/19 (!) 163.3 kg (360 lb)     NAD  RRR, s1s2   CTA B   Abd benign  EXT 2+ edema  Neuro grossly normal  Psych grossly normal    Please excuse typos, dictation completed via Conservation officer, historic buildings      _____________________________________________________________  [employer] Attestation:         This document serves as a record of the services and decisions performed by Elpidio Anis, MD on 09/08/2019. It was created on his behalf by Inez Pilgrim, a trained medical scribe. The creation of this document is based on the provider's statements and observations that were conveyed to the medical scribe during the patient's encounter.     (The information in this document, created by the medical scribe for me, accurately reflects the services I personally performed and the decisions made by me. I have reviewed and approved this document for accuracy.)     Elpidio Anis  MD [facility]  Division of Cardiology   [place] [facility] [phone]  Pager [phone]

## 2019-09-08 NOTE — Unmapped (Addendum)
We are going to order an echocardiogram to evaluate your heart function.    Return to see me in 3 months, sooner for any new concerns    Due to your femoral nerve injury and foot drop, it is OK with me if you have an electric wheelchair and handicap parking sticker.  However, you should continue a regular walking program which is good for your leg and your heart.  It would also be a good idea to do the cardiac rehabilitation program.

## 2019-09-09 DIAGNOSIS — K769 Liver disease, unspecified: Principal | ICD-10-CM

## 2019-09-09 DIAGNOSIS — M1030 Gout due to renal impairment, unspecified site: Principal | ICD-10-CM

## 2019-09-09 DIAGNOSIS — D509 Iron deficiency anemia, unspecified: Principal | ICD-10-CM

## 2019-09-09 DIAGNOSIS — J4520 Mild intermittent asthma, uncomplicated: Principal | ICD-10-CM

## 2019-09-09 DIAGNOSIS — F4320 Adjustment disorder, unspecified: Principal | ICD-10-CM

## 2019-09-09 DIAGNOSIS — E875 Hyperkalemia: Principal | ICD-10-CM

## 2019-09-09 DIAGNOSIS — I21A1 Myocardial infarction type 2: Principal | ICD-10-CM

## 2019-09-09 DIAGNOSIS — I161 Hypertensive emergency: Principal | ICD-10-CM

## 2019-09-09 DIAGNOSIS — I25110 Atherosclerotic heart disease of native coronary artery with unstable angina pectoris: Principal | ICD-10-CM

## 2019-09-09 DIAGNOSIS — F419 Anxiety disorder, unspecified: Principal | ICD-10-CM

## 2019-09-09 DIAGNOSIS — I480 Paroxysmal atrial fibrillation: Principal | ICD-10-CM

## 2019-09-09 DIAGNOSIS — K3184 Gastroparesis: Principal | ICD-10-CM

## 2019-09-09 DIAGNOSIS — I051 Rheumatic mitral insufficiency: Principal | ICD-10-CM

## 2019-09-09 DIAGNOSIS — I5043 Acute on chronic combined systolic (congestive) and diastolic (congestive) heart failure: Principal | ICD-10-CM

## 2019-09-09 DIAGNOSIS — M1990 Unspecified osteoarthritis, unspecified site: Principal | ICD-10-CM

## 2019-09-09 DIAGNOSIS — G894 Chronic pain syndrome: Principal | ICD-10-CM

## 2019-09-09 DIAGNOSIS — E1122 Type 2 diabetes mellitus with diabetic chronic kidney disease: Principal | ICD-10-CM

## 2019-09-09 DIAGNOSIS — M21372 Foot drop, left foot: Principal | ICD-10-CM

## 2019-09-09 DIAGNOSIS — E1143 Type 2 diabetes mellitus with diabetic autonomic (poly)neuropathy: Principal | ICD-10-CM

## 2019-09-09 DIAGNOSIS — N186 End stage renal disease: Principal | ICD-10-CM

## 2019-09-09 DIAGNOSIS — J9601 Acute respiratory failure with hypoxia: Principal | ICD-10-CM

## 2019-09-09 DIAGNOSIS — G4733 Obstructive sleep apnea (adult) (pediatric): Principal | ICD-10-CM

## 2019-09-09 DIAGNOSIS — I110 Hypertensive heart disease with heart failure: Principal | ICD-10-CM

## 2019-09-09 MED ORDER — ALBUTEROL SULFATE HFA 90 MCG/ACTUATION AEROSOL INHALER
RESPIRATORY_TRACT | 2 refills | 0 days | Status: CP
Start: 2019-09-09 — End: ?
  Filled 2019-09-13: qty 18, 25d supply, fill #0

## 2019-09-10 DIAGNOSIS — Z794 Long term (current) use of insulin: Principal | ICD-10-CM

## 2019-09-10 DIAGNOSIS — N184 Chronic kidney disease, stage 4 (severe): Principal | ICD-10-CM

## 2019-09-10 DIAGNOSIS — E1122 Type 2 diabetes mellitus with diabetic chronic kidney disease: Principal | ICD-10-CM

## 2019-09-10 MED ORDER — INSULIN ASPART (U-100) 100 UNIT/ML (3 ML) SUBCUTANEOUS PEN
0 refills | 0 days
Start: 2019-09-10 — End: ?

## 2019-09-10 NOTE — Unmapped (Signed)
Albuterol rx requested.

## 2019-09-11 DIAGNOSIS — F4320 Adjustment disorder, unspecified: Principal | ICD-10-CM

## 2019-09-11 DIAGNOSIS — M1030 Gout due to renal impairment, unspecified site: Principal | ICD-10-CM

## 2019-09-11 DIAGNOSIS — I25110 Atherosclerotic heart disease of native coronary artery with unstable angina pectoris: Principal | ICD-10-CM

## 2019-09-11 DIAGNOSIS — E875 Hyperkalemia: Principal | ICD-10-CM

## 2019-09-11 DIAGNOSIS — I5043 Acute on chronic combined systolic (congestive) and diastolic (congestive) heart failure: Principal | ICD-10-CM

## 2019-09-11 DIAGNOSIS — E1122 Type 2 diabetes mellitus with diabetic chronic kidney disease: Principal | ICD-10-CM

## 2019-09-11 DIAGNOSIS — K3184 Gastroparesis: Principal | ICD-10-CM

## 2019-09-11 DIAGNOSIS — E1143 Type 2 diabetes mellitus with diabetic autonomic (poly)neuropathy: Principal | ICD-10-CM

## 2019-09-11 DIAGNOSIS — I051 Rheumatic mitral insufficiency: Principal | ICD-10-CM

## 2019-09-11 DIAGNOSIS — I161 Hypertensive emergency: Principal | ICD-10-CM

## 2019-09-11 DIAGNOSIS — G894 Chronic pain syndrome: Principal | ICD-10-CM

## 2019-09-11 DIAGNOSIS — I21A1 Myocardial infarction type 2: Principal | ICD-10-CM

## 2019-09-11 DIAGNOSIS — F419 Anxiety disorder, unspecified: Principal | ICD-10-CM

## 2019-09-11 DIAGNOSIS — N186 End stage renal disease: Principal | ICD-10-CM

## 2019-09-11 DIAGNOSIS — I480 Paroxysmal atrial fibrillation: Principal | ICD-10-CM

## 2019-09-11 DIAGNOSIS — D509 Iron deficiency anemia, unspecified: Principal | ICD-10-CM

## 2019-09-11 DIAGNOSIS — I110 Hypertensive heart disease with heart failure: Principal | ICD-10-CM

## 2019-09-11 DIAGNOSIS — K769 Liver disease, unspecified: Principal | ICD-10-CM

## 2019-09-11 DIAGNOSIS — M1990 Unspecified osteoarthritis, unspecified site: Principal | ICD-10-CM

## 2019-09-11 DIAGNOSIS — M21372 Foot drop, left foot: Principal | ICD-10-CM

## 2019-09-11 DIAGNOSIS — G4733 Obstructive sleep apnea (adult) (pediatric): Principal | ICD-10-CM

## 2019-09-11 DIAGNOSIS — J4520 Mild intermittent asthma, uncomplicated: Principal | ICD-10-CM

## 2019-09-11 DIAGNOSIS — J9601 Acute respiratory failure with hypoxia: Principal | ICD-10-CM

## 2019-09-12 DIAGNOSIS — I110 Hypertensive heart disease with heart failure: Principal | ICD-10-CM

## 2019-09-12 DIAGNOSIS — E1122 Type 2 diabetes mellitus with diabetic chronic kidney disease: Principal | ICD-10-CM

## 2019-09-12 DIAGNOSIS — N186 End stage renal disease: Principal | ICD-10-CM

## 2019-09-12 DIAGNOSIS — I480 Paroxysmal atrial fibrillation: Principal | ICD-10-CM

## 2019-09-12 DIAGNOSIS — E875 Hyperkalemia: Principal | ICD-10-CM

## 2019-09-12 DIAGNOSIS — J9601 Acute respiratory failure with hypoxia: Principal | ICD-10-CM

## 2019-09-12 DIAGNOSIS — I161 Hypertensive emergency: Principal | ICD-10-CM

## 2019-09-12 DIAGNOSIS — M1990 Unspecified osteoarthritis, unspecified site: Principal | ICD-10-CM

## 2019-09-12 DIAGNOSIS — I21A1 Myocardial infarction type 2: Principal | ICD-10-CM

## 2019-09-12 DIAGNOSIS — M21372 Foot drop, left foot: Principal | ICD-10-CM

## 2019-09-12 DIAGNOSIS — G4733 Obstructive sleep apnea (adult) (pediatric): Principal | ICD-10-CM

## 2019-09-12 DIAGNOSIS — G894 Chronic pain syndrome: Principal | ICD-10-CM

## 2019-09-12 DIAGNOSIS — I5043 Acute on chronic combined systolic (congestive) and diastolic (congestive) heart failure: Principal | ICD-10-CM

## 2019-09-12 DIAGNOSIS — J4520 Mild intermittent asthma, uncomplicated: Principal | ICD-10-CM

## 2019-09-12 DIAGNOSIS — E1143 Type 2 diabetes mellitus with diabetic autonomic (poly)neuropathy: Principal | ICD-10-CM

## 2019-09-12 DIAGNOSIS — I051 Rheumatic mitral insufficiency: Principal | ICD-10-CM

## 2019-09-12 DIAGNOSIS — D509 Iron deficiency anemia, unspecified: Principal | ICD-10-CM

## 2019-09-12 DIAGNOSIS — I25110 Atherosclerotic heart disease of native coronary artery with unstable angina pectoris: Principal | ICD-10-CM

## 2019-09-12 DIAGNOSIS — F419 Anxiety disorder, unspecified: Principal | ICD-10-CM

## 2019-09-12 DIAGNOSIS — K769 Liver disease, unspecified: Principal | ICD-10-CM

## 2019-09-12 DIAGNOSIS — K3184 Gastroparesis: Principal | ICD-10-CM

## 2019-09-12 DIAGNOSIS — M1030 Gout due to renal impairment, unspecified site: Principal | ICD-10-CM

## 2019-09-12 DIAGNOSIS — F4320 Adjustment disorder, unspecified: Principal | ICD-10-CM

## 2019-09-12 MED ORDER — INSULIN ASPART (U-100) 100 UNIT/ML (3 ML) SUBCUTANEOUS PEN
Freq: Three times a day (TID) | SUBCUTANEOUS | 3 refills | 100 days | Status: CP
Start: 2019-09-12 — End: 2020-09-11
  Filled 2019-09-13: qty 15, 40d supply, fill #0

## 2019-09-13 MED ORDER — BUMETANIDE 2 MG TABLET
ORAL_TABLET | Freq: Two times a day (BID) | ORAL | 3 refills | 90.00000 days | Status: CP
Start: 2019-09-13 — End: 2020-09-12
  Filled 2019-09-13: qty 180, 90d supply, fill #0

## 2019-09-13 MED FILL — UNIFINE PENTIPS 31 GAUGE X 5/16" NEEDLE (8 MM): 80 days supply | Qty: 400 | Fill #2

## 2019-09-13 MED FILL — LEVEMIR FLEXTOUCH U-100 INSULIN 100 UNIT/ML (3 ML) SUBCUTANEOUS PEN: 40 days supply | Qty: 15 | Fill #0 | Status: AC

## 2019-09-13 MED FILL — BUMETANIDE 2 MG TABLET: 90 days supply | Qty: 180 | Fill #0 | Status: AC

## 2019-09-13 MED FILL — NOVOLOG FLEXPEN U-100 INSULIN ASPART 100 UNIT/ML (3 ML) SUBCUTANEOUS: SUBCUTANEOUS | 90 days supply | Qty: 45 | Fill #0

## 2019-09-13 MED FILL — VENTOLIN HFA 90 MCG/ACTUATION AEROSOL INHALER: 25 days supply | Qty: 18 | Fill #0 | Status: AC

## 2019-09-13 MED FILL — NOVOLOG FLEXPEN U-100 INSULIN ASPART 100 UNIT/ML (3 ML) SUBCUTANEOUS: 90 days supply | Qty: 45 | Fill #0 | Status: AC

## 2019-09-13 MED FILL — UNIFINE PENTIPS 31 GAUGE X 5/16" NEEDLE (8 MM): 80 days supply | Qty: 400 | Fill #2 | Status: AC

## 2019-09-14 ENCOUNTER — Ambulatory Visit
Admit: 2019-09-14 | Discharge: 2019-09-14 | Disposition: A | Discharge status: Home / Self Care | Payer: MEDICARE | Attending: Emergency Medicine

## 2019-09-14 ENCOUNTER — Emergency Department
Admit: 2019-09-14 | Discharge: 2019-09-14 | Disposition: A | Discharge status: Home / Self Care | Payer: MEDICARE | Attending: Emergency Medicine

## 2019-09-14 DIAGNOSIS — G894 Chronic pain syndrome: Principal | ICD-10-CM

## 2019-09-14 DIAGNOSIS — J9601 Acute respiratory failure with hypoxia: Principal | ICD-10-CM

## 2019-09-14 DIAGNOSIS — M1030 Gout due to renal impairment, unspecified site: Principal | ICD-10-CM

## 2019-09-14 DIAGNOSIS — F419 Anxiety disorder, unspecified: Principal | ICD-10-CM

## 2019-09-14 DIAGNOSIS — I110 Hypertensive heart disease with heart failure: Principal | ICD-10-CM

## 2019-09-14 DIAGNOSIS — M21372 Foot drop, left foot: Principal | ICD-10-CM

## 2019-09-14 DIAGNOSIS — F4320 Adjustment disorder, unspecified: Principal | ICD-10-CM

## 2019-09-14 DIAGNOSIS — I161 Hypertensive emergency: Principal | ICD-10-CM

## 2019-09-14 DIAGNOSIS — D509 Iron deficiency anemia, unspecified: Principal | ICD-10-CM

## 2019-09-14 DIAGNOSIS — J810 Acute pulmonary edema: Principal | ICD-10-CM

## 2019-09-14 DIAGNOSIS — I21A1 Myocardial infarction type 2: Principal | ICD-10-CM

## 2019-09-14 DIAGNOSIS — E875 Hyperkalemia: Principal | ICD-10-CM

## 2019-09-14 DIAGNOSIS — J4520 Mild intermittent asthma, uncomplicated: Principal | ICD-10-CM

## 2019-09-14 DIAGNOSIS — K769 Liver disease, unspecified: Principal | ICD-10-CM

## 2019-09-14 DIAGNOSIS — I051 Rheumatic mitral insufficiency: Principal | ICD-10-CM

## 2019-09-14 DIAGNOSIS — N186 End stage renal disease: Principal | ICD-10-CM

## 2019-09-14 DIAGNOSIS — G4733 Obstructive sleep apnea (adult) (pediatric): Principal | ICD-10-CM

## 2019-09-14 DIAGNOSIS — I480 Paroxysmal atrial fibrillation: Principal | ICD-10-CM

## 2019-09-14 DIAGNOSIS — E1143 Type 2 diabetes mellitus with diabetic autonomic (poly)neuropathy: Principal | ICD-10-CM

## 2019-09-14 DIAGNOSIS — I25110 Atherosclerotic heart disease of native coronary artery with unstable angina pectoris: Principal | ICD-10-CM

## 2019-09-14 DIAGNOSIS — R0600 Dyspnea, unspecified: Principal | ICD-10-CM

## 2019-09-14 DIAGNOSIS — I5043 Acute on chronic combined systolic (congestive) and diastolic (congestive) heart failure: Principal | ICD-10-CM

## 2019-09-14 DIAGNOSIS — M1990 Unspecified osteoarthritis, unspecified site: Principal | ICD-10-CM

## 2019-09-14 DIAGNOSIS — K3184 Gastroparesis: Principal | ICD-10-CM

## 2019-09-14 DIAGNOSIS — E1122 Type 2 diabetes mellitus with diabetic chronic kidney disease: Principal | ICD-10-CM

## 2019-09-14 LAB — COMPREHENSIVE METABOLIC PANEL
ALBUMIN: 2.4 g/dL — ABNORMAL LOW (ref 3.4–5.0)
ALKALINE PHOSPHATASE: 103 U/L (ref 46–116)
ALT (SGPT): 8 U/L — ABNORMAL LOW (ref 10–49)
ANION GAP: 8 mmol/L (ref 3–11)
AST (SGOT): 9 U/L (ref <34)
BILIRUBIN TOTAL: 0.3 mg/dL (ref 0.3–1.2)
BLOOD UREA NITROGEN: 32 mg/dL — ABNORMAL HIGH (ref 9–23)
BUN / CREAT RATIO: 8
CHLORIDE: 105 mmol/L (ref 98–107)
CO2: 26.2 mmol/L (ref 20.0–31.0)
CREATININE: 4.19 mg/dL — ABNORMAL HIGH (ref 0.60–1.10)
EGFR CKD-EPI AA MALE: 18 mL/min/{1.73_m2}
EGFR CKD-EPI NON-AA MALE: 15 mL/min/{1.73_m2}
POTASSIUM: 4.1 mmol/L (ref 3.5–5.1)
PROTEIN TOTAL: 6.8 g/dL (ref 5.7–8.2)
SODIUM: 139 mmol/L (ref 135–145)

## 2019-09-14 LAB — CBC W/ AUTO DIFF
BASOPHILS ABSOLUTE COUNT: 0.1 10*9/L (ref 0.0–0.1)
BASOPHILS RELATIVE PERCENT: 0.6 %
HEMATOCRIT: 28 % — ABNORMAL LOW (ref 38.0–50.0)
HEMOGLOBIN: 9.2 g/dL — ABNORMAL LOW (ref 13.5–17.5)
LYMPHOCYTES ABSOLUTE COUNT: 1.4 10*9/L (ref 0.7–4.0)
LYMPHOCYTES RELATIVE PERCENT: 14.5 %
MEAN CORPUSCULAR HEMOGLOBIN CONC: 32.7 g/dL (ref 30.0–36.0)
MEAN CORPUSCULAR HEMOGLOBIN: 26.8 pg (ref 26.0–34.0)
MEAN CORPUSCULAR VOLUME: 82.1 fL (ref 81.0–95.0)
MEAN PLATELET VOLUME: 8.1 fL (ref 7.0–10.0)
MONOCYTES ABSOLUTE COUNT: 0.8 10*9/L (ref 0.1–1.0)
MONOCYTES RELATIVE PERCENT: 7.7 %
NEUTROPHILS ABSOLUTE COUNT: 7.3 10*9/L (ref 1.7–7.7)
NEUTROPHILS RELATIVE PERCENT: 75.6 %
PLATELET COUNT: 304 10*9/L (ref 150–450)
RED BLOOD CELL COUNT: 3.42 10*12/L — ABNORMAL LOW (ref 4.32–5.72)
RED CELL DISTRIBUTION WIDTH: 16.9 % — ABNORMAL HIGH (ref 12.0–15.0)
WBC ADJUSTED: 9.7 10*9/L (ref 3.5–10.5)

## 2019-09-14 LAB — TROPONIN I: Troponin I.cardiac:MCnc:Pt:Ser/Plas:Qn:: <0.06

## 2019-09-14 LAB — MONOCYTES RELATIVE PERCENT: Monocytes/100 leukocytes:NFr:Pt:Bld:Qn:Automated count: 7.7

## 2019-09-14 LAB — PRO-BNP: Natriuretic peptide.B prohormone N-Terminal:MCnc:Pt:Ser/Plas:Qn:: 8740 — ABNORMAL HIGH

## 2019-09-14 LAB — CALCIUM: Calcium:MCnc:Pt:Ser/Plas:Qn:: 8.4 — ABNORMAL LOW

## 2019-09-14 MED ADMIN — bumetanide (BUMEX) injection 2 mg: 2 mg | INTRAVENOUS | @ 21:00:00 | Stop: 2019-09-14

## 2019-09-14 MED ADMIN — albuterol 2.5 mg /3 mL (0.083 %) nebulizer solution 5 mg: 5 mg | RESPIRATORY_TRACT | @ 22:00:00 | Stop: 2019-09-14

## 2019-09-14 MED ADMIN — ipratropium (ATROVENT) 0.02 % nebulizer solution 500 mcg: 500 ug | RESPIRATORY_TRACT | @ 22:00:00 | Stop: 2019-09-14

## 2019-09-14 NOTE — Unmapped (Signed)
Pt reports home health nurse came to house today and pt had wheezing and SOB. Pt with dialysis in the morning. Pt wants to make sure no fluid build up

## 2019-09-14 NOTE — Unmapped (Signed)
[facility]  Emergency Department Provider Note     ED Clinical Impression      Final diagnoses:   Dyspnea, unspecified type (Primary)   Acute pulmonary edema (CMS-[name])        Impression, ED Course, Assessment and Plan      Impression: Jeremy Oliver is a 49 y.o. male  with PMH of asthma, ESRD (on dialysis), a-fib, CAD, DM, GERD, gout, HLD, HTN, NSTEMI, CHF (EF 40-45% in 2017), nephrolithiasis, and nonalcoholic fatty liver disease presenting to the ED with 1 day of a cough, shortness of breath, and BLE swelling. Patient was told to present to the ED by his at-home nurse with concern for fluid in his lungs.    On exam, patient is non-toxic appearing and in NAD. VS are WNL. HRRR. Exam notable for crackles in bilateral lung bases (R>L). 2+ pitting edema in BLE up to the knees. Exam otherwise unremarkable.    Differential includes pulmonary edema versus CHF exacerbation versus less likely pneumonia or ACS or asthma exacerbation.    Plan for EKG, CXR, troponin, coags, and basic labs. Will give patient bumetanide for swelling and albuterol with nebulizer for shortness of breath.    4:39 PM  CXR shows mild pulmonary vascular congestion and prominence of the interstitial lung markings. Impression of mild pulmonary edema. EKG similar to prior.  Pending troponin, coags, and basic labs.  IV Bumex given.    6:56 PM  Troponin negative. Basic labs notable for RBC 3.42 and creatinine 4.19 but this is baseline for patient. Labs otherwise unremarkable. Upon reassessment, patient is feeling slightly better and lung sounds are more clear following albuterol and nebulizer.  Was able to diurese some with Bumex.  Offered admission for more urgent dialysis and patient states that he feels that he will be safe until tomorrow.  Patient has requested discharge given he has a dialysis appointment tomorrow morning. Will discharge patient pending coags and advise him to go to scheduled dialysis in the morning. Strict return precautions given. Patient expresses understanding and is agreeable to the plan.      Additional Medical Decision Making     I have reviewed the vital signs and the nursing notes. Labs and radiology results that were available during my care of the patient were independently reviewed by me and considered in my medical decision making.     I directly visualized and independently interpreted the EKG tracing.   I independently visualized the radiology images.   I reviewed the patient's prior medical records (previous admission records).       Portions of this record have been created using Scientist, clinical (histocompatibility and immunogenetics). Dictation errors have been sought, but Khun not have been identified and corrected.  ____________________________________________         History      Chief Complaint  Cough    HPI   Jeremy Oliver is a 49 y.o. 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 Patient reports 1 day of a cough, shortness of breath, and general malaise. He states that he feels as though he is drowning when he lays down. Patient was told to present to the ED by his at-home nurse with concern for fluid in his lungs. He additionally notes 1 day of BLE swelling and pain that is greater than baseline. Cardiac history  notable for CHF and NSTEMI in 2018. Patient's last dialysis was yesterday, with no complications. He is still able to urinate. Patient has been using his albuterol inhaler with no relief of his respiratory symptoms.  Wife gave Bumex today and patient reports that he urinated approximately 500 cc but did not feel any improvement.  Patient denies any fevers, chills, or any other medical concerns at this time.    Past Medical History:   Diagnosis Date   ??? Angina at rest (CMS-[name])    ??? Arthritis    ??? Asthma    ??? Atrial fibrillation and flutter (CMS-[name]) 06/08/14 ??? BMI 40.0-44.9, adult (CMS-[name]) 10/29/2015   ??? Chronic anticoagulation 02/17/2018   ??? Chronic pain syndrome 04/11/2014    ~Use daily lyrica as recommended ~Tramadol as needed for pain ~Fluoxetine daily as recommended ~Daily exercise ~Eat a healthy diet ~Good control of your blood sugars can help    ??? Coronary artery disease    ??? Depression    ??? Diabetes mellitus (CMS-[name])    ??? ESRD (end stage renal disease) on dialysis (CMS-[name])    ??? Eye trauma 1998    glass in both eyes and removed   ??? GERD (gastroesophageal reflux disease)    ??? Gout    ??? Homeless    ??? Hyperlipidemia    ??? Hypertension    ??? Illiteracy and low-level literacy    ??? Microscopic hematuria    ??? Migraine    ??? Nephrolithiasis    ??? 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 Nonalcoholic fatty liver disease    ??? NSTEMI (non-ST elevated myocardial infarction) (CMS-[name]) 01/13/2017   ??? Obesity    ??? Obstructive sleep apnea 2009   ??? Panic attacks    ??? 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 Seizure-like activity (CMS-[name])     ~Monitor for symptoms and report them to your primary care provider if they occur    ??? Systolic CHF, chronic (CMS-[name])     EF 40-45% 2017   ??? TIA (transient ischemic attack)      Patient Active Problem List   Diagnosis   ??? 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 Idiopathic chronic gout of left knee   ??? Hypercholesteremia   ??? Diabetes mellitus with coincident hypertension (CMS-[name])   ??? 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 Osteoarthritis   ??? Chronic nonalcoholic liver disease   ??? GERD (gastroesophageal reflux disease)   ??? Mild intermittent asthma without complication   ??? Chronic pain syndrome   ??? Paroxysmal atrial fibrillation (CMS-[name])   ??? OSA (obstructive sleep apnea)   ??? Tobacco use disorder   ??? Status post cardiac pacemaker procedure   ??? Diabetes mellitus with gastroparesis (CMS-[name])   ??? Cardiac pacemaker in situ   ??? Recurrent major depressive disorder, in full remission (CMS-[name])   ??? Adjustment disorder with mixed anxiety and depressed mood   ??? Hyperkalemia   ??? Acquired left  foot drop   ??? Decreased sensation of lower extremity, left   ??? Chronic anticoagulation   ??? Type 2 diabetes mellitus with stage 4 chronic kidney disease, with long-term current use of insulin (CMS-[name])   ??? Situational stress   ??? Cough   ??? QT prolongation   ??? ESRD needing dialysis (CMS-[name])   ??? Palliative care by specialist   ??? Angina pectoris (CMS-[name])   ??? Psychophysiological insomnia     Past Surgical History:   Procedure Laterality Date   ??? CARDIAC CATHETERIZATION  03/08/2007   ??? CORONARY STENT PLACEMENT     ??? CYSTOURETHROSCOPY  12/31/2009     Microscopic hematuria   ??? KNEE SURGERY Right 09/23/2006    arthroscopic knee surgery medial and lateral meniscus tears as well as crystal disease   ??? PR CATH PLACE/CORON ANGIO, IMG SUPER/INTERP,R&L HRT CATH, L HRT VENTRIC N/A 02/13/2017    Procedure: Left/Right Heart Catheterization;  Surgeon: Marlaine Hind, MD;  Location: [facility] CATH;  Service: Cardiology   ??? PR CATH PLACE/CORON ANGIO, IMG SUPER/INTERP,W LEFT HEART VENTRICULOGRAPHY N/A 03/17/2017    Procedure: Left Heart Catheterization W Intervention;  Surgeon: Marlaine Hind, MD;  Location: [facility] CATH;  Service: Cardiology   ??? PR CATH PLACE/CORON ANGIO, IMG SUPER/INTERP,W LEFT HEART VENTRICULOGRAPHY N/A 05/12/2018    Procedure: CATH LEFT HEART CATHETERIZATION W INTERVENTION;  Surgeon: Dorathy Kinsman, MD; Location: [facility] CATH;  Service: Cardiology   ??? PR CATH PLACE/CORON ANGIO, IMG SUPER/INTERP,W LEFT HEART VENTRICULOGRAPHY N/A 10/22/2018    Procedure: CATH LEFT HEART CATHETERIZATION W INTERVENTION;  Surgeon: Marlaine Hind, MD;  Location: [facility] CATH;  Service: Cardiology   ??? PR CATH PLACE/CORON ANGIO, IMG SUPER/INTERP,W LEFT HEART VENTRICULOGRAPHY N/A 08/19/2019    Procedure: Left Heart Catheterization W Intervention;  Surgeon: Marlaine Hind, MD;  Location: [facility] CATH;  Service: Cardiology   ??? PR ECMO/ECLS INITIATION VENO-ARTERIAL N/A 03/19/2017    Procedure: ECMO/ECLS; INITIATION, VENO-ARTERIAL;  Surgeon: Lennie Odor, MD;  Location: MAIN OR [facility];  Service: Cardiac Surgery   ??? PR ECMO/ECLS RMVL PRPH CANNULA OPEN 6 YRS & OLDER Left 03/25/2017    Procedure: ECMO / ECLS PROVIDED BY PHYSICIAN; REMOVAL OF PERIPHERAL (ARTERIAL AND/OR VENOUS) CANNULA(E), OPEN, 6 YEARS AND OLDER;  Surgeon: Alonna Buckler Ikonomidis, MD;  Location: MAIN OR [facility];  Service: Cardiac Surgery   ??? PR EPHYS EVAL W/ ABLATION SUPRAVENT ARRHYTHMIA N/A 07/19/2015    Catheter ablation of cavotricuspid isthmus for atrial flutter [facility])   ??? PR INSER HART PACER XVENOUS ATRIAL N/A 05/30/2015    [employer] dual-chamber pacemaker implant (E.Chung)   ??? PR INSERT/PLACE FLOW DIRECT CATH N/A 03/17/2017    Procedure: Insert Leave In [place];  Surgeon: Marlaine Hind, MD;  Location: [facility] CATH;  Service: Cardiology   ??? PR INSJ/RPLCMT PERM DFB W/TRNSVNS LDS 1/DUAL CHMBR N/A 05/04/2017    Procedure: ICD Implant System (Single/Dual);  Surgeon: Eldred Manges, MD;  Location: [facility] CATH;  Service: Cardiology   ??? PR NEGATIVE PRESSURE WOUND THERAPY DME >50 SQ CM Left 03/25/2017    Procedure: Neg Press Wound Tx (Vac Assist) Incl Topicals, Per Session, Tsa Greater Than/= 50 Cm Squared;  Surgeon: Alonna Buckler Ikonomidis, MD;  Location: MAIN OR [facility];  Service: Cardiac Surgery   ??? PR PRQ TRLUML CORONARY STENT W/ANGIO ONE ART/BRNCH N/A 03/12/2018    Procedure: Percutaneous Coronary Intervention;  Surgeon: Marlaine Hind, MD;  Location: [facility] CATH;  Service: Cardiology   ??? PR REBL VES DIRECT,LOW EXTREM Left  03/19/2017    Procedure: Repr Bld Vessel Direct; Lower Extrem;  Surgeon: Boykin Reaper, MD;  Location: MAIN OR [facility];  Service: Vascular   ??? PR REMV ART CLOT ILIAC-POP,LEG INCIS Left 03/25/2017    Procedure: EMBOLECTOMY OR THROMBECTOMY, WITH OR WITHOUT CATHETER; FEMOROPOPLITEAL, AORTOILIAC ARTERY, BY LEG INCISION;  Surgeon: Alonna Buckler Ikonomidis, MD;  Location: MAIN OR [facility];  Service: Cardiac Surgery   ??? PR UPPER GI ENDOSCOPY,BIOPSY N/A 02/28/2016    Procedure: UGI ENDOSCOPY; WITH BIOPSY, SINGLE OR MULTIPLE;  Surgeon: Liane Comber, MD;  Location: [facility];  Service: Gastroenterology   ??? PR UPPER GI ENDOSCOPY,DIAGNOSIS N/A 05/07/2016    Procedure: UGI ENDO, INCLUDE ESOPHAGUS, STOMACH, & DUODENUM &/OR JEJUNUM; DX W/WO COLLECTION SPECIMN, BY BRUSH OR WASH;  Surgeon: Modena Nunnery, MD;  Location: [facility];  Service: Gastroenterology     No current facility-administered medications for this encounter.    Current Outpatient Medications:   ???  acetaminophen (TYLENOL) 500 MG tablet, TAKE 2 TABLETS (1 000MG ) BY MOUTH EVERY 8 HOURS AS NEEDED FOR PAIN, Disp: 180 tablet, Rfl: 0  ???  albuterol HFA 90 mcg/actuation inhaler, Inhale 2 puffs into the lungs every 6 (six) hours as needed for wheezing or shortness of breath., Disp: 18 g, Rfl: 2  ???  alirocumab (PRALUENT) 150 mg/mL subcutaneous injection, Inject the contents of 1 pen (150 mg total) under the skin every fourteen (14) days., Disp: 6 mL, Rfl: 3  ???  allopurinoL (ZYLOPRIM) 100 MG tablet, Take 2 tablets (200 mg total) by mouth daily., Disp: 60 tablet, Rfl: 5  ???  amiodarone (PACERONE) 200 MG tablet, TAKE 1 TABLET BY MOUTH ONCE DAILY, Disp: 90 tablet, Rfl: 3  ???  amLODIPine (NORVASC) 10 MG tablet, Take 10 mg by mouth daily., Disp: , Rfl:   ???  apixaban (ELIQUIS) 5 mg Tab, Take 1 tablet (5 mg total) by mouth Two (2) times a day., Disp: 60 tablet, Rfl: 11  ???  atorvastatin (LIPITOR) 80 MG tablet, Take 1 tablet (80 mg total) by mouth daily., Disp: 90 tablet, Rfl: 3  ???  blood sugar diagnostic Strp, Disp to match current ins approved meter. Use to test blood sugar four times daily. E11.22, Z79.4 (Patient taking differently: Disp to match current ins approved meter. Use to test blood sugar five times daily. E11.22, Z79.4), Disp: 400 each, Rfl: 3  ???  blood-glucose meter kit, Disp. blood glucose meter kit preferred by patient's insurance. Dx: Diabetes, E11.9, Disp: 1 each, Rfl: 11  ???  bumetanide (BUMEX) 2 MG tablet, Take 1 tablet (2 mg total) by mouth two (2) times a day. On non dialysis days, Disp: 180 tablet, Rfl: 3  ???  carvediloL (COREG) 25 MG tablet, Take 1 tablet (25 mg total) by mouth Two (2) times a day. HOLD morning dose on dialysis days, Disp: 60 tablet, Rfl: 0  ???  cholecalciferol, vitamin D3 25 mcg, 1,000 units,, 1,000 unit (25 mcg) tablet, Take 1 tablet (25 mcg total) by mouth daily., Disp: 100 tablet, Rfl: 11  ???  diclofenac sodium (VOLTAREN) 1 % gel, Apply 2 grams topically Three (3) times a day., Disp: 180 g, Rfl: 11  ???  ezetimibe (ZETIA) 10 mg tablet, TAKE 1 TABLET BY MOUTH ONCE DAILY, Disp: 90 each, Rfl: 3  ???  FLUoxetine (PROZAC) 40 MG capsule, Take 2 capsules (80 mg total) by mouth daily. (Patient taking differently: Take 80 mg by mouth nightly. ), Disp: 180 capsule, Rfl: 3  ???  gabapentin (NEURONTIN) 300 MG capsule, Take 1 capsule (300 mg total) by mouth two (2) times a day., Disp: 180 capsule, Rfl: 3  ???  insulin ASPART (NOVOLOG FLEXPEN U-100 INSULIN) 100 unit/mL (3 mL) injection pen, Inject 15 Units under the skin Three (3) times a day before meals., Disp: 45 mL, Rfl: 3  ???  insulin detemir U-100 (LEVEMIR FLEXTOUCH U-100 INSULN) 100 unit/mL (3 mL) injection pen, Inject 37 units total under the skin nightly, Disp: 15 mL, Rfl: 3  ???  insulin syringe-needle U-100 0.3 mL 31 gauge x 5/16 Syrg, Use to inject insulin twice daily with meals, Disp: 60 each, Rfl: 0  ???  isosorbide mononitrate (IMDUR) 30 MG 24 hr tablet, Take 1 tablet (30 mg total) by mouth daily. DO NOT TAKE on dialysis days, Disp: 120 tablet, Rfl: 3  ???  lancets Misc, Disp. Ins preferred lancets. Use to test blood sugar four times daily. E11.22, Z79.4, Disp: 400 each, Rfl: 3  ???  loperamide (IMODIUM) 2 mg capsule, Take 1 capsule (2 mg total) by mouth 4 (four) times a day as needed for diarrhea., Disp: 30 capsule, Rfl: 3  ???  miscellaneous medical supply (BLOOD PRESSURE CUFF) Misc, Measure BP once a day or if any symptoms, Disp: 1 each, Rfl: 0  ???  nitroglycerin (NITROSTAT) 0.4 MG SL tablet, Place 1 tablet (0.4 mg total) under the tongue every five (5) minutes as needed for chest pain., Disp: 100 each, Rfl: 3  ???  omeprazole (PRILOSEC) 40 MG capsule, Take 1 capsule (40 mg total) by mouth Two (2) times a day (30 minutes before a meal)., Disp: 180 capsule, Rfl: 3  ???  semaglutide 0.25 mg or 0.5 mg(2 mg/1.5 mL) PnIj, Inject 0.5 mg under the skin every seven (7) days., Disp: 4.5 mL, Rfl: 3  ???  sevelamer (RENVELA) 800 mg tablet, TAKE 2 TABLETS (1600MG ) BY MOUTH THREE TIMES DAILY WITH MEALS, Disp: 540 tablet, Rfl: 3  ???  ticagrelor (BRILINTA) 90 mg Tab, TAKE 1 TABLET BY MOUTH TWICE DAILY, Disp: 180 tablet, Rfl: 3  ???  UNIFINE PENTIPS 31 gauge x 5/16 (8 mm) Ndle, USE WITH INSULIN AND VICTOZA 5 TIMES DAILY, Disp: 400 each, Rfl: 3    Allergies  Losartan and Opioids - morphine analogues    Family History   Problem Relation Age of Onset   ??? Diabetes Mother    ??? Hyperlipidemia Mother    ??? Heart disease Mother    ??? Basal cell carcinoma Mother    ??? Blindness Mother         diabeties   ??? Obesity Mother    ??? Hyperlipidemia Father    ??? Heart disease Father    ??? Lung disease Father    ??? Heart disease Half-Sister    ??? Kidney disease Half-Sister    ??? Gallbladder disease Half-Brother    ??? Obesity Half-Brother    ??? No Known Problems Daughter    ??? No Known Problems Son    ??? Obesity Daughter    ??? Depression Maternal Uncle         Died by suicide in 04-Oct-2017 by gunshot   ??? Melanoma Neg Hx    ??? Squamous cell carcinoma Neg Hx    ??? Macular degeneration Neg Hx    ??? Glaucoma Neg Hx      Social History  Social History     Tobacco Use   ??? Smoking status: Never Smoker   ??? Smokeless tobacco: Current User  Types: Chew   ??? Tobacco comment: 08/15/19:  Dips 1x a day./ /Pt in process of reducing tobacco use,  dips 1 can every 3 weeks,   Vaping Use   ??? Vaping Use: Never used   Substance Use Topics   ??? Alcohol use: Never     Alcohol/week: 0.0 standard drinks     Comment: rare use: couple times a year, small amount   ??? Drug use: No     Review of Systems    Constitutional: Negative for fever.  Eyes: Negative for visual changes.  ENT: Negative for sore throat.  Cardiovascular: Negative for chest pain.  Respiratory: Positive for cough and shortness of breath.  Gastrointestinal: Negative for abdominal pain, vomiting or diarrhea.  Genitourinary: Negative for dysuria.  Musculoskeletal: Positive for leg swelling. Negative for back pain.  Skin: Negative for rash.  Neurological: Negative for headaches, focal weakness or numbness.     Physical Exam     This provider entered the patient's room: Yes:    ??? If this provider did not enter the room, a comprehensive physical exam was not able to be performed due to increased infection risk to themselves, other providers, staff and other patients), as well as to conserve personal protective equipment (PPE) utilization during the COVID-19 pandemic.    ??? If this provider did enter the patient room, the following was PPE worn: Surgical mask, eye protection and gloves    ED Triage Vitals [09/14/19 1440]   Enc Vitals Group      BP 127/78      Pulse       SpO2 Pulse 86      Resp 20      Temp 37 ??C (98.6 ??F)      Temp Source Oral      SpO2 98 %     Constitutional: Alert and oriented. Well appearing and in no distress.  Eyes: Conjunctivae are normal.  ENT Head: Normocephalic and atraumatic.       Nose: No congestion.       Mouth/Throat: Mucous membranes are moist.       Neck: No stridor.  Hematological/Lymphatic/Immunilogical: No cervical lymphadenopathy.  Cardiovascular: Normal rate, regular rhythm. Normal and symmetric distal pulses are present in all extremities.  Respiratory: Diminished air movement throughout.  Crackles in bilateral lung bases (R>L). Normal respiratory effort.  Gastrointestinal: Soft and nontender. There is no CVA tenderness.  Musculoskeletal: 2+ pitting edema in BLE up to the knees. Normal range of motion in all extremities.       Right lower leg: No tenderness.       Left lower leg: No tenderness.  Neurologic: Normal speech and language. No gross focal neurologic deficits are appreciated.  Skin: Skin is warm, dry and intact. No rash noted.  Psychiatric: Mood and affect are normal. Speech and behavior are normal.     EKG     Normal sinus rhythm at 81 with a first-degree AV block, lateral T wave inversions.  No STEMI.  When compared to previous EKG from 09-08-19, no significant change was noted.     Radiology     XR Chest Portable   Final Result      --Mild pulmonary edema.          Documentation assistance was provided by Lacey Jensen, Scribe, on September 14, 2019 at 4:27 PM for Shaune Leeks, MD.        September 14, 2019 8:05 PM. Documentation assistance provided by the scribe. I was  present during the time the encounter was recorded. The information recorded by the scribe was done at my direction and has been reviewed and validated by me.        Sherryl Barters, MD  09/14/19 2008

## 2019-09-14 NOTE — Unmapped (Signed)
As we were scheduled for a telemedicine visit, I spoke to both Jeremy Oliver as well as his wife but he is currently in the hospital because of  wheezing and shortness of breath after dialysis.  He will need to be rescheduled for his visit.

## 2019-09-15 NOTE — Unmapped (Signed)
Contains abnormal data Pro-BNP  Order: [id number]  Status:  Final result ????    Sent to provider for review and action

## 2019-09-15 NOTE — Unmapped (Signed)
Care Management  Initial Transition Planning Assessment    Covering CM 5-7pm    CM met with patient in pt room.  Pt/visitors were not wearing hospital provided masks for the duration of the interaction with CM.   CM was wearing hospital provided surgical mask and hospital provided eye protection.  CM was not within 6 foot of the patient/visitors during this interaction.    Home Safety Screening  Patient Questions:    1.  Before the illness or injury that brought you to the ED, did you need someone to help you on a regular basis? Do you need people to help you with activities of daily living?  ?? Yes - wife helps  2.  In the last 24 hours, have you needed more help than usual?  ?? Yes - SOB   3.  Have you been hospitalized for one or more nights during the past six months?   ?? Yes - 2  4.  Have you been to an emergency room one or more times in the past six months?  ?? Yes - 3  5.  In general, do you have problems with your vision that cannot be corrected with glasses/contacts?  ?? No  6.  In general, do you have problems with your memory?  ?? No  7.  Do you take six or more different medications every day?   ?? Yes - ALOT  8. Do you have any trouble getting, taking, keeping up with or paying for your medications?   ?? No  9   Have you had any falls over the past few weeks or are you concerned about falling?  ?? No  10.  Are you having trouble accessing and/or preparing the meals you need?  ?? No  11. Do you have wounds or bed ulcers?  ?? No  12  Do you have access to transportation?    Yes - spouse    Care Manager Questions:  Is the patient over the age of 62?  ?? No  *Home Safety Evaluation endorsed  5 of the screening items and   No intervention required at this time and pt is open to [facility] with [facility]     When patient/family asked, Were the services I provided/discussed with you today of value?, patient/family responded Y/N? Yes                General  Care Manager assessed the patient by : In person interview with patient, Telephone conversation with patient, In person interview with family, Medical record review, Discussion with Clinical Care team  Orientation Level: Oriented X4  Functional level prior to admission: Partially Assisted  Who provides care at home?: Other (Comment), Family member (spouse)  Level of assistance required: Other (light assistance required)  Reason for referral: Discharge Planning    Contact/Decision Maker  Extended Emergency Contact Information  Primary Emergency Contact: Ciesla,[name]  Address: [address]           [place], [place] [address] [place] [employer] Phone: [phone]  Relation: Spouse  Secondary Emergency Contact: Fogel,Bridgette   [place]  Home Phone: [phone]  Mobile Phone: [phone]  Relation: Daughter    Legal Next of Kin / Guardian / POA / Advance Directives     [name] [facility]): Bruski,[name] - [phone]    [name], [facility] Alternate: Esquibel,Bridgette - Daughter - [phone]    Advance Directive (Medical Treatment)  Does patient have an advance directive  covering medical treatment?: Patient does not have advance directive covering medical treatment.  Reason patient does not have an advance directive covering medical treatment:: Patient needs follow-up to complete one., Patient does not wish to complete one at this time.    Health Care Decision Maker [[name]] (Medical & Mental Health Treatment)  Healthcare Decision Maker: Patient does not wish to appoint a Health Care Decision Maker at this time  Information offered on [name], Medical & Mental Health advance directives:: Patient declined information.    Advance Directive (Mental Health Treatment)  Does patient have an advance directive covering mental health treatment?: Patient does not have advance directive covering mental health treatment.  Reason patient does not have an advance directive covering mental health treatment:: Patient does not wish to complete one at this time.    Patient Information  Lives with: Spouse/significant other    Type of Residence: Private residence        Location/Detail: [address], [place] [address] per prior visit, pt's wife declined to provide, stated only use [address] [place] [address] phone [phone]         Responsibilities/Dependents at home?: No    Home Care services in place prior to admission?: No  Type of Home Care services in place prior to admission: Home nursing visits, Home PT, Home OT  Current Home Care provider (Name/Phone #): [facility]    Outpatient/Community Resources in place prior to admission: Clinic  Agency detail (Name/Phone #): PCP Talbert Forest    Equipment Currently Used at Home: respiratory supplies  Current [employer] (Name/Phone #): [facility] [place]    Currently receiving outpatient dialysis?: No       Financial Information       Need for financial assistance?: No       Social Determinants of Health  Social Determinants of Health were addressed in provider documentation.  Please refer to patient history.  Social Determinants of Health     Tobacco Use: High Risk   ??? Smoking Tobacco Use: Never Smoker   ??? Smokeless Tobacco Use: Current User   Alcohol Use: Not At Risk   ??? How often do you have 5 or more drinks on one occasion?: 0   ??? How many drinks containing alcohol do you have on a typical day when you are drinking?: Not on file   ??? How often do you have a drink containing alcohol?: 0   Financial Resource Strain: High Risk   ??? Difficulty of Paying Living Expenses: Hard   Food Insecurity: Food Insecurity Present   ??? Worried About Programme researcher, broadcasting/film/video in the Last Year: Sometimes true   ??? Ran Out of Food in the Last Year: Sometimes true   Transportation Needs: No Transportation Needs   ??? Lack of Transportation (Medical): No   ??? Lack of Transportation (Non-Medical): No   Physical Activity:    ??? Days of Exercise per Week:    ??? Minutes of Exercise per Session:    Stress: Stress Concern Present   ??? Feeling of Stress : Rather much   Social Connections: Moderately Isolated   ??? Frequency of Communication with Friends and Family: More than three times a week   ??? Frequency of Social Gatherings with Friends and Family: Twice a week   ??? Attends Religious Services: Never   ??? Active Member of Clubs or Organizations: No   ??? Attends Banker Meetings: Never   ??? Marital Status: Married  Intimate Partner Violence: Not At Risk   ??? Fear of Current or Ex-Partner: No   ??? Emotionally Abused: No   ??? Physically Abused: No   ??? Sexually Abused: No   Depression: Not at risk   ??? PHQ-2 Score: 2   Housing Stability: Low Risk    ??? Within the past 12 months, have you ever stayed: outside, in a car, in a tent, in an overnight shelter, or temporarily in someone else's home (i.e. couch-surfing)?: No   ??? Are you worried about losing your housing?: No   ??? Within the past 12 months, have you been unable to get utilities (heat, electricity) when it was really needed?: No   Substance Use:    ??? Taken prescription drugs for non-medical reasons:    ??? Taken illegal drugs:    ??? Patient indicated they have taken drugs in the past year, including Cannabis, Cocaine, Prescription stimulants, Methamphetamine, Inahalnts, Sedatives or sleeping pills, Hallucinogens, Street Opioids or Prescription opiods for non-medical reasons:    Health Literacy: Low Risk    ??? : Never       Discharge Needs Assessment  Concerns to be Addressed: no discharge needs identified    Clinical Risk Factors: Multiple Diagnoses (Chronic), Poor Health Literacy, Functional Limitations, Dialysis, Other (Comment) (HD [facility] [place] [facility])    Barriers to taking medications: No [facility])    Prior overnight hospital stay or ED visit in last 90 days: Yes    Readmission Within the Last 30 Days: no previous admission in last 30 days         Anticipated Changes Related to Illness: none    Equipment Needed After Discharge: none    Discharge Facility/Level of Care Needs: other (see comments) (Home with established home health)    Readmission  Risk of Unplanned Readmission Score:  %  Predictive Model Details   No score data available for [facility] Risk of Unplanned Readmission     Readmitted Within the Last 30 Days? (No if blank)   Patient at risk for readmission?: Yes    Discharge Plan  Screen findings are: Discharge planning needs identified or anticipated (Comment). (will need to verify HD chair if pt admits and contact his HD center)    Expected Discharge Date:     Expected Transfer from Critical Care:      Quality data for continuing care services shared with patient and/or representative?: Yes  Patient and/or family were provided with choice of facilities / services that are available and appropriate to meet post hospital care needs?: Yes   List choices in order highest to lowest preferred, if applicable. : verbally stated [facility] preferred    Initial Assessment complete?: Yes

## 2019-09-20 MED ORDER — LOPERAMIDE 2 MG CAPSULE
ORAL_CAPSULE | 2 refills | 0 days | Status: CP
Start: 2019-09-20 — End: ?
  Filled 2019-09-22: qty 240, 30d supply, fill #0

## 2019-09-22 ENCOUNTER — Ambulatory Visit: Admit: 2019-09-22 | Discharge: 2019-09-23 | Payer: MEDICARE

## 2019-09-22 DIAGNOSIS — I5042 Chronic combined systolic (congestive) and diastolic (congestive) heart failure: Principal | ICD-10-CM

## 2019-09-22 DIAGNOSIS — Z992 Dependence on renal dialysis: Principal | ICD-10-CM

## 2019-09-22 DIAGNOSIS — I10 Essential (primary) hypertension: Principal | ICD-10-CM

## 2019-09-22 DIAGNOSIS — I25118 Atherosclerotic heart disease of native coronary artery with other forms of angina pectoris: Principal | ICD-10-CM

## 2019-09-22 DIAGNOSIS — N186 End stage renal disease: Principal | ICD-10-CM

## 2019-09-22 DIAGNOSIS — I480 Paroxysmal atrial fibrillation: Principal | ICD-10-CM

## 2019-09-22 MED ADMIN — perflutren lipid microspheres (DEFINITY) injection 1.4 mL: 1.4 mL | INTRAVENOUS | @ 18:00:00 | Stop: 2019-09-22

## 2019-09-22 MED FILL — LOPERAMIDE 2 MG CAPSULE: 30 days supply | Qty: 240 | Fill #0 | Status: AC

## 2019-09-23 DIAGNOSIS — M21372 Foot drop, left foot: Principal | ICD-10-CM

## 2019-09-23 DIAGNOSIS — M1990 Unspecified osteoarthritis, unspecified site: Principal | ICD-10-CM

## 2019-09-23 DIAGNOSIS — M1030 Gout due to renal impairment, unspecified site: Principal | ICD-10-CM

## 2019-09-23 DIAGNOSIS — E875 Hyperkalemia: Principal | ICD-10-CM

## 2019-09-23 DIAGNOSIS — K3184 Gastroparesis: Principal | ICD-10-CM

## 2019-09-23 DIAGNOSIS — I051 Rheumatic mitral insufficiency: Principal | ICD-10-CM

## 2019-09-23 DIAGNOSIS — I5043 Acute on chronic combined systolic (congestive) and diastolic (congestive) heart failure: Principal | ICD-10-CM

## 2019-09-23 DIAGNOSIS — E1122 Type 2 diabetes mellitus with diabetic chronic kidney disease: Principal | ICD-10-CM

## 2019-09-23 DIAGNOSIS — J9601 Acute respiratory failure with hypoxia: Principal | ICD-10-CM

## 2019-09-23 DIAGNOSIS — N186 End stage renal disease: Principal | ICD-10-CM

## 2019-09-23 DIAGNOSIS — I161 Hypertensive emergency: Principal | ICD-10-CM

## 2019-09-23 DIAGNOSIS — I480 Paroxysmal atrial fibrillation: Principal | ICD-10-CM

## 2019-09-23 DIAGNOSIS — F4320 Adjustment disorder, unspecified: Principal | ICD-10-CM

## 2019-09-23 DIAGNOSIS — I110 Hypertensive heart disease with heart failure: Principal | ICD-10-CM

## 2019-09-23 DIAGNOSIS — G4733 Obstructive sleep apnea (adult) (pediatric): Principal | ICD-10-CM

## 2019-09-23 DIAGNOSIS — I25110 Atherosclerotic heart disease of native coronary artery with unstable angina pectoris: Principal | ICD-10-CM

## 2019-09-23 DIAGNOSIS — G894 Chronic pain syndrome: Principal | ICD-10-CM

## 2019-09-23 DIAGNOSIS — E1143 Type 2 diabetes mellitus with diabetic autonomic (poly)neuropathy: Principal | ICD-10-CM

## 2019-09-23 DIAGNOSIS — F419 Anxiety disorder, unspecified: Principal | ICD-10-CM

## 2019-09-23 DIAGNOSIS — I21A1 Myocardial infarction type 2: Principal | ICD-10-CM

## 2019-09-23 DIAGNOSIS — J4520 Mild intermittent asthma, uncomplicated: Principal | ICD-10-CM

## 2019-09-23 DIAGNOSIS — K769 Liver disease, unspecified: Principal | ICD-10-CM

## 2019-09-23 DIAGNOSIS — D509 Iron deficiency anemia, unspecified: Principal | ICD-10-CM

## 2019-09-23 MED FILL — CARVEDILOL 25 MG TABLET: 30 days supply | Qty: 60 | Fill #0 | Status: AC

## 2019-09-27 MED ORDER — ONDANSETRON HCL 4 MG TABLET
ORAL_TABLET | Freq: Three times a day (TID) | ORAL | 0 refills | 10.00000 days | PRN
Start: 2019-09-27 — End: ?
  Filled 2019-09-30: qty 15, 8d supply, fill #0

## 2019-09-29 NOTE — Unmapped (Signed)
Decreased frequency and quantity due to prolonged QTc.

## 2019-09-30 MED FILL — ONDANSETRON HCL 4 MG TABLET: 8 days supply | Qty: 15 | Fill #0 | Status: AC

## 2019-09-30 MED FILL — ELIQUIS 5 MG TABLET: 30 days supply | Qty: 60 | Fill #1 | Status: AC

## 2019-09-30 MED FILL — GABAPENTIN 300 MG CAPSULE: 90 days supply | Qty: 180 | Fill #2 | Status: AC

## 2019-09-30 MED FILL — EZETIMIBE 10 MG TABLET: ORAL | 90 days supply | Qty: 90 | Fill #2

## 2019-09-30 MED FILL — ELIQUIS 5 MG TABLET: ORAL | 30 days supply | Qty: 60 | Fill #1

## 2019-09-30 MED FILL — OZEMPIC 0.25 MG OR 0.5 MG (2 MG/1.5 ML) SUBCUTANEOUS PEN INJECTOR: 84 days supply | Qty: 4 | Fill #3 | Status: AC

## 2019-09-30 MED FILL — VENTOLIN HFA 90 MCG/ACTUATION AEROSOL INHALER: RESPIRATORY_TRACT | 25 days supply | Qty: 18 | Fill #1

## 2019-09-30 MED FILL — EZETIMIBE 10 MG TABLET: 90 days supply | Qty: 90 | Fill #2 | Status: AC

## 2019-09-30 MED FILL — ALLOPURINOL 100 MG TABLET: 30 days supply | Qty: 60 | Fill #3 | Status: AC

## 2019-09-30 MED FILL — ALLOPURINOL 100 MG TABLET: ORAL | 30 days supply | Qty: 60 | Fill #3

## 2019-09-30 MED FILL — OZEMPIC 0.25 MG OR 0.5 MG (2 MG/1.5 ML) SUBCUTANEOUS PEN INJECTOR: SUBCUTANEOUS | 84 days supply | Qty: 4.5 | Fill #3

## 2019-09-30 MED FILL — VENTOLIN HFA 90 MCG/ACTUATION AEROSOL INHALER: 25 days supply | Qty: 18 | Fill #1 | Status: AC

## 2019-09-30 MED FILL — GABAPENTIN 300 MG CAPSULE: ORAL | 90 days supply | Qty: 180 | Fill #2

## 2019-10-10 MED ORDER — CARVEDILOL 25 MG TABLET
ORAL_TABLET | Freq: Two times a day (BID) | ORAL | 3 refills | 90.00000 days | Status: CP
Start: 2019-10-10 — End: 2020-10-09
  Filled 2019-11-03: qty 60, 30d supply, fill #0

## 2019-10-11 MED ORDER — OZEMPIC 0.25 MG OR 0.5 MG (2 MG/1.5 ML) SUBCUTANEOUS PEN INJECTOR
SUBCUTANEOUS | 3 refills | 84 days
Start: 2019-10-11 — End: 2020-10-10

## 2019-10-12 MED ORDER — ONDANSETRON HCL 4 MG TABLET
ORAL_TABLET | Freq: Two times a day (BID) | ORAL | 0 refills | 8.00000 days | Status: CP | PRN
Start: 2019-10-12 — End: ?
  Filled 2019-10-14: qty 15, 8d supply, fill #0

## 2019-10-12 MED ORDER — OZEMPIC 0.25 MG OR 0.5 MG (2 MG/1.5 ML) SUBCUTANEOUS PEN INJECTOR
SUBCUTANEOUS | 3 refills | 84.00000 days | Status: CP
Start: 2019-10-12 — End: 2020-10-11
  Filled 2020-01-05: qty 4.5, 84d supply, fill #0

## 2019-10-12 NOTE — Unmapped (Signed)
error 

## 2019-10-13 MED ORDER — LANCETS
3 refills | 0.00000 days | Status: CP
Start: 2019-10-13 — End: 2019-10-13

## 2019-10-13 MED ORDER — LANCETS: each | 3 refills | 0 days | Status: AC

## 2019-10-13 MED ORDER — BLOOD SUGAR DIAGNOSTIC STRIPS: each | 3 refills | 0 days | Status: AC

## 2019-10-13 MED ORDER — BLOOD SUGAR DIAGNOSTIC STRIPS
3 refills | 0.00000 days | Status: CP
Start: 2019-10-13 — End: 2019-10-13

## 2019-10-13 NOTE — Unmapped (Signed)
Patients wife called multiple times screaming about patient medication. Tried to help and kept getting hung up on and screamed at.

## 2019-10-13 NOTE — Unmapped (Signed)
Patient Requests Medication Refill    ??? Name of medication: lancets Misc   blood sugar diagnostic Strp              Patient taking differently: Disp to match current ins approved meter. Use to test blood sugar five times daily. E11.22, Z79.4, Reported on 08/19/2019        ???   ??? Desired pharmacy:  o Name: [facility]   [address] [place] [address]   o Location/ phone number:   ??? Days supply requested:   How many days supply do you have left?:

## 2019-10-13 NOTE — Unmapped (Signed)
I had already warned patient.  What can we do with her behavior even though he is my patient, not her?

## 2019-10-13 NOTE — Unmapped (Signed)
Pt requests strips.

## 2019-10-14 ENCOUNTER — Ambulatory Visit: Admit: 2019-10-14 | Payer: MEDICARE

## 2019-10-14 DIAGNOSIS — Z794 Long term (current) use of insulin: Principal | ICD-10-CM

## 2019-10-14 DIAGNOSIS — E1122 Type 2 diabetes mellitus with diabetic chronic kidney disease: Principal | ICD-10-CM

## 2019-10-14 DIAGNOSIS — N186 End stage renal disease: Principal | ICD-10-CM

## 2019-10-14 DIAGNOSIS — Z992 Dependence on renal dialysis: Principal | ICD-10-CM

## 2019-10-14 MED ORDER — BLOOD SUGAR DIAGNOSTIC STRIPS
Freq: Four times a day (QID) | 3 refills | 0.00000 days | Status: CP
Start: 2019-10-14 — End: 2020-10-13

## 2019-10-14 MED ORDER — LANCETS: each | 3 refills | 0 days | Status: AC

## 2019-10-14 MED ORDER — LANCETS
Freq: Four times a day (QID) | SUBCUTANEOUS | 3 refills | 0.00000 days | Status: CP
Start: 2019-10-14 — End: 2019-10-14

## 2019-10-14 MED ORDER — BLOOD SUGAR DIAGNOSTIC STRIPS: each | 3 refills | 0 days | Status: AC

## 2019-10-14 MED FILL — LEVEMIR FLEXTOUCH U-100 INSULIN 100 UNIT/ML (3 ML) SUBCUTANEOUS PEN: 40 days supply | Qty: 15 | Fill #1

## 2019-10-14 MED FILL — RENVELA 800 MG TABLET: 20 days supply | Qty: 120 | Fill #3

## 2019-10-14 MED FILL — ONDANSETRON HCL 4 MG TABLET: 8 days supply | Qty: 15 | Fill #0 | Status: AC

## 2019-10-14 MED FILL — LEVEMIR FLEXTOUCH U-100 INSULIN 100 UNIT/ML (3 ML) SUBCUTANEOUS PEN: 40 days supply | Qty: 15 | Fill #1 | Status: AC

## 2019-10-14 MED FILL — RENVELA 800 MG TABLET: 20 days supply | Qty: 120 | Fill #3 | Status: AC

## 2019-10-14 MED FILL — HYDROXYZINE HCL 10 MG TABLET: 20 days supply | Qty: 20 | Fill #1 | Status: AC

## 2019-10-14 MED FILL — HYDROXYZINE HCL 10 MG TABLET: ORAL | 20 days supply | Qty: 20 | Fill #1

## 2019-10-14 MED FILL — OMEPRAZOLE 40 MG CAPSULE,DELAYED RELEASE: 20 days supply | Qty: 40 | Fill #3 | Status: AC

## 2019-10-14 MED FILL — OMEPRAZOLE 40 MG CAPSULE,DELAYED RELEASE: ORAL | 20 days supply | Qty: 40 | Fill #3

## 2019-10-14 NOTE — Unmapped (Signed)
Addended by: Talbert Forest D on: 10/14/2019 01:05 PM     Modules accepted: Orders

## 2019-10-14 NOTE — Unmapped (Signed)
Medication Question/ Issue from Patient    ??? Name of medication:Blood sugar diagnostic strip  ??? Specific Issue: medissue: patient requesting prescription CHANGE. Specifically: needs script to say 3 times a day as Nicolette Bang will not feel for 4x day. (eg, increase to TID, change to liquid)  ??? Last appt with PCP: missed todays appointment last appt was in [place] 2021  o Desired outcome (eg, re-send to pharmacy):Send new script for test strips 3 x day  o Best time to call back: any time   Call back number: [phone]    Thank you   Vickie  10/14/19

## 2019-10-14 NOTE — Unmapped (Signed)
CCM2 Follow-up Call  Request from admin stating pt wanted to check BG 3 times daily for insurance purposes. Rx changed so pt can pick up today. However, then pt calls CCM2 and asks to please talk to insurance company b/c he does check BG 4-5 times daily. Pt missed appt today d/t planning beach trips. Will notify provider.     PCP Action: pt does want to check BG states,  four-five times daily. Pt's insurance does not cover this request without PA. Please order lancets and strips rxs     CCM2 Action:follow up scheduling - pt denies need. Will follow up w/ PA team and PCP regarding request for 5x daily BG checks.     Patient Action:will call to r/s denies need to r/s today. EF has improved, will be getting port placed next week for HD  Sept 9- Dialysis.  Sept 10- going to the beach.     Follow up/Discuss next call:  HD       Pt missing appt today, will contact admin to reschedule.  Refilling per protocol.     Medication Question/ Issue from Patient  ??  ?? Name of medication:Blood sugar diagnostic strip  ?? Specific Issue: medissue: patient requesting prescription CHANGE. Specifically: needs script to say 3 times a day as Nicolette Bang will not feel for 4x day. (eg, increase to TID, change to liquid)  ?? Last appt with PCP: missed todays appointment last appt was in [place] 2021  ? Desired outcome (eg, re-send to pharmacy):Send new script for test strips 3 x day  ? Best time to call back: any time              Call back number: [phone]  ??  Thank you   Vickie  10/14/19

## 2019-10-14 NOTE — Unmapped (Signed)
Pharmacy states rx for test strips and lancets have a quantity not approved by pt's insurance.  Changed quantity from 400 to 300 per protocol.

## 2019-10-14 NOTE — Unmapped (Signed)
I have written for [facility] for lancets and strips (this is with meals and at night).  This is the most I can write for, but doesn't mean that insurance will cover.  I have sent to [facility], but not sure if correct as this was indicated on any of messages. Please reinforce he should not miss his dialysis due to going out of town.     Thanks!    I was the supervising physician in the delivery of the service. Kurtis Bushman, MD

## 2019-10-27 MED ORDER — EMPTY CONTAINER
2 refills | 0 days
Start: 2019-10-27 — End: ?

## 2019-10-27 NOTE — Unmapped (Signed)
[facility] Shared [facility] Clinical Assessment & Refill Coordination Note    Jeremy Oliver, DOB: Jul 14, 1970  Phone: [phone] (home)     All above HIPAA information was verified with patient.     Was a Nurse, learning disability used for this call? No    Specialty Medication(s):   General Specialty: Praluent     Current Outpatient Medications   Medication Sig Dispense Refill   ??? acetaminophen (TYLENOL) 500 MG tablet TAKE 2 TABLETS (1 000MG ) BY MOUTH EVERY 8 HOURS AS NEEDED FOR PAIN 180 tablet 0   ??? albuterol HFA 90 mcg/actuation inhaler Inhale 2 puffs into the lungs every 6 (six) hours as needed for wheezing or shortness of breath. 18 g 2   ??? alirocumab (PRALUENT) 150 mg/mL subcutaneous injection Inject the contents of 1 pen (150 mg total) under the skin every fourteen (14) days. 6 mL 3   ??? allopurinoL (ZYLOPRIM) 100 MG tablet Take 2 tablets (200 mg total) by mouth daily. 60 tablet 5   ??? amiodarone (PACERONE) 200 MG tablet TAKE 1 TABLET BY MOUTH ONCE DAILY 90 tablet 3   ??? amLODIPine (NORVASC) 10 MG tablet Take 10 mg by mouth daily.     ??? apixaban (ELIQUIS) 5 mg Tab Take 1 tablet (5 mg total) by mouth Two (2) times a day. 60 tablet 11   ??? atorvastatin (LIPITOR) 80 MG tablet Take 1 tablet (80 mg total) by mouth daily. 90 tablet 3   ??? blood sugar diagnostic Strp Use as directed to check blood glucose Four (4) times a day (before meals and nightly). 300 each 3   ??? blood-glucose meter kit Disp. blood glucose meter kit preferred by patient's insurance. Dx: Diabetes, E11.9 1 each 11   ??? bumetanide (BUMEX) 2 MG tablet Take 1 tablet (2 mg total) by mouth two (2) times a day. On non dialysis days 180 tablet 3   ??? carvediloL (COREG) 25 MG tablet Take 1 tablet (25 mg total) by mouth Two (2) times a day. HOLD morning dose on dialysis days 180 tablet 3   ??? cholecalciferol, vitamin D3 25 mcg, 1,000 units,, 1,000 unit (25 mcg) tablet Take 1 tablet (25 mcg total) by mouth daily. 100 tablet 11   ??? diclofenac sodium (VOLTAREN) 1 % gel Apply 2 grams topically Three (3) times a day. 180 g 11   ??? ezetimibe (ZETIA) 10 mg tablet TAKE 1 TABLET BY MOUTH ONCE DAILY 90 each 3   ??? FLUoxetine (PROZAC) 40 MG capsule Take 2 capsules (80 mg total) by mouth daily. (Patient taking differently: Take 80 mg by mouth nightly. ) 180 capsule 3   ??? gabapentin (NEURONTIN) 300 MG capsule Take 1 capsule (300 mg total) by mouth two (2) times a day. 180 capsule 3   ??? hydrOXYzine (ATARAX) 10 MG tablet Take 1 tablet (10 mg total) by mouth nightly as needed for anxiety. 30 tablet 6   ??? insulin ASPART (NOVOLOG FLEXPEN U-100 INSULIN) 100 unit/mL (3 mL) injection pen Inject 15 Units under the skin Three (3) times a day before meals. 45 mL 3   ??? insulin detemir U-100 (LEVEMIR FLEXTOUCH U-100 INSULN) 100 unit/mL (3 mL) injection pen Inject 37 units total under the skin nightly 15 mL 3   ??? insulin syringe-needle U-100 0.3 mL 31 gauge x 5/16 Syrg Use to inject insulin twice daily with meals 60 each 0   ??? isosorbide mononitrate (IMDUR) 30 MG 24 hr tablet Take 1 tablet (30 mg total) by  mouth daily. DO NOT TAKE on dialysis days 120 tablet 3   ??? lancets Misc Use as directed to check blood glucose Four (4) times a day (before meals and nightly). 300 each 3   ??? loperamide (IMODIUM) 2 mg capsule Take 1 capsule twice a day and another capsule after each episode of diarrhea, up to 8 pills per day. 240 capsule 2   ??? miscellaneous medical supply (BLOOD PRESSURE CUFF) Misc Measure BP once a day or if any symptoms 1 each 0   ??? nitroglycerin (NITROSTAT) 0.4 MG SL tablet Place 1 tablet (0.4 mg total) under the tongue every five (5) minutes as needed for chest pain. 100 each 3   ??? omeprazole (PRILOSEC) 40 MG capsule Take 1 capsule (40 mg total) by mouth Two (2) times a day (30 minutes before a meal). 180 capsule 3   ??? ondansetron (ZOFRAN) 4 MG tablet Take 1 tablet (4 mg total) by mouth every twelve (12) hours as needed. 15 tablet 0   ??? semaglutide (OZEMPIC) 0.25 mg or 0.5 mg(2 mg/1.5 mL) PnIj injection Inject 0.5 mg under the skin every seven (7) days. 4.5 mL 3   ??? sevelamer (RENVELA) 800 mg tablet TAKE 2 TABLETS (1600MG ) BY MOUTH THREE TIMES DAILY WITH MEALS 540 tablet 3   ??? ticagrelor (BRILINTA) 90 mg Tab TAKE 1 TABLET BY MOUTH TWICE DAILY 180 tablet 3   ??? UNIFINE PENTIPS 31 gauge x 5/16 (8 mm) Ndle USE WITH INSULIN AND VICTOZA 5 TIMES DAILY 400 each 3     No current facility-administered medications for this visit.        Changes to medications: [name] reports no changes at this time.    Allergies   Allergen Reactions   ??? Losartan      Hyperkalemia (K>6)   ??? Opioids - Morphine Analogues Anxiety       Changes to allergies: No    SPECIALTY MEDICATION ADHERENCE     Praluent 150 mg/ml: 3 days of medicine on hand       Medication Adherence    Patient reported X missed doses in the last month: 0  Specialty Medication: Praluent 150 mg/mL  Informant: patient          Specialty medication(s) dose(s) confirmed: Regimen is correct and unchanged.     Are there any concerns with adherence? No    Adherence counseling provided? Not needed    CLINICAL MANAGEMENT AND INTERVENTION      Clinical Benefit Assessment:    Do you feel the medicine is effective or helping your condition? Yes    Clinical Benefit counseling provided? Per progress note on 09/08/19, will check lipid levels at next visit to assess effectiveness.    Adverse Effects Assessment:    Are you experiencing any side effects? No    Are you experiencing difficulty administering your medicine? No    Quality of Life Assessment:    How many days over the past month did your high cholesterol  keep you from your normal activities? For example, brushing your teeth or getting up in the morning. 0    Have you discussed this with your provider? Not needed    Therapy Appropriateness:    Is therapy appropriate? Yes, therapy is appropriate and should be continued    DISEASE/MEDICATION-SPECIFIC INFORMATION      For patients on injectable medications: Patient currently has 1 doses left.  Next injection is scheduled for 10/29/19.    PATIENT SPECIFIC NEEDS     -  Does the patient have any physical, cognitive, or cultural barriers? No    - Is the patient high risk? No     - Does the patient require a Care Management Plan? No     - Does the patient require physician intervention or other additional services (i.e. nutrition, smoking cessation, social work)? No      SHIPPING     Specialty Medication(s) to be Shipped:   General Specialty: Praluent    Other medication(s) to be shipped: No additional medications requested for fill at this time     Changes to insurance: No    Delivery Scheduled: Yes, Expected medication delivery date: 11/02/19.     Medication will be delivered via [facility] clinic to the confirmed prescription address in [place].    The patient will receive a drug information handout for each medication shipped and additional FDA Medication Guides as required.  Verified that patient has previously received a Conservation officer, historic buildings.    All of the patient's questions and concerns have been addressed.    Camillo Flaming   [facility] Shared [facility] Specialty Pharmacist

## 2019-10-29 ENCOUNTER — Ambulatory Visit: Admit: 2019-10-29 | Discharge: 2019-10-30 | Payer: MEDICARE

## 2019-10-31 MED ORDER — ONDANSETRON HCL 4 MG TABLET
ORAL_TABLET | Freq: Two times a day (BID) | ORAL | 0 refills | 8 days | PRN
Start: 2019-10-31 — End: ?

## 2019-11-01 ENCOUNTER — Institutional Professional Consult (permissible substitution): Admit: 2019-11-01 | Discharge: 2019-11-02 | Payer: MEDICARE

## 2019-11-01 MED ORDER — ONDANSETRON HCL 4 MG TABLET
ORAL_TABLET | Freq: Two times a day (BID) | ORAL | 0 refills | 8.00000 days | Status: CP | PRN
Start: 2019-11-01 — End: ?
  Filled 2019-11-03: qty 15, 8d supply, fill #0

## 2019-11-02 MED FILL — PRALUENT PEN 150 MG/ML SUBCUTANEOUS PEN INJECTOR: 84 days supply | Qty: 6 | Fill #1 | Status: AC

## 2019-11-02 MED FILL — PRALUENT PEN 150 MG/ML SUBCUTANEOUS PEN INJECTOR: SUBCUTANEOUS | 84 days supply | Qty: 6 | Fill #1

## 2019-11-02 MED FILL — EMPTY CONTAINER: 120 days supply | Qty: 1 | Fill #0

## 2019-11-02 MED FILL — EMPTY CONTAINER: 120 days supply | Qty: 1 | Fill #0 | Status: AC

## 2019-11-02 NOTE — Unmapped (Signed)
All appointments with [facility] EP REMOTE MONITORING occur from home.  You do not come in to the office or hospital for these visits.        Your Device data report from this visit can be found in your Mychart by following the steps below.  Step #1-Select Menu then document center    Step #2-Select my documents    Step #3-Select Device checks    Please contact us with any questions.    Thank you,    [facility] EP REMOTE MONITORING [facility]  Phone. [phone]  Fax. [phone]  Email. [email or web address]

## 2019-11-02 NOTE — Unmapped (Signed)
Cardiac Implantable Electronic Device Remote Monitoring     Visit Date:  11/01/2019    Findings: Normal device function, Tested Lead measurements stable and within normal range, Adequate battery reserve-11 years     Arrhythmias: Multiple AF events; 2 % AF burden; currently on Eliquis. Multiple NS-VT and VT-1 episodes EGM's appear to be runs of AF with RVR.     Plan: Continue Routine Remote Monitoring     Last seen in person: 10/16/2018; needs annual device check    Manufacturer of Device: [employer]       Type of Device: Oceanographer  See scanned/downloaded PDF report for model numbers, serial numbers, and date(s) of implant.    Presenting Rhythm: AS/VS AF     ______________________________________________________________________    Heart Failure Monitoring: No Evidence of Fluid Accumulation  Hx of HF.       Please see downloaded PDF file of transmission under Media Tab  for full details of device interrogation to include, when applicable,  battery status/charge time, lead trend data, and programmed parameters.

## 2019-11-03 MED FILL — ELIQUIS 5 MG TABLET: ORAL | 30 days supply | Qty: 60 | Fill #2

## 2019-11-03 MED FILL — CARVEDILOL 25 MG TABLET: 30 days supply | Qty: 60 | Fill #0 | Status: AC

## 2019-11-03 MED FILL — VENTOLIN HFA 90 MCG/ACTUATION AEROSOL INHALER: 25 days supply | Qty: 18 | Fill #2 | Status: AC

## 2019-11-03 MED FILL — RENVELA 800 MG TABLET: 30 days supply | Qty: 180 | Fill #4

## 2019-11-03 MED FILL — OMEPRAZOLE 40 MG CAPSULE,DELAYED RELEASE: 30 days supply | Qty: 60 | Fill #4 | Status: AC

## 2019-11-03 MED FILL — OMEPRAZOLE 40 MG CAPSULE,DELAYED RELEASE: ORAL | 30 days supply | Qty: 60 | Fill #4

## 2019-11-03 MED FILL — LOPERAMIDE 2 MG CAPSULE: 30 days supply | Qty: 240 | Fill #1

## 2019-11-03 MED FILL — VENTOLIN HFA 90 MCG/ACTUATION AEROSOL INHALER: RESPIRATORY_TRACT | 25 days supply | Qty: 18 | Fill #2

## 2019-11-03 MED FILL — ELIQUIS 5 MG TABLET: 30 days supply | Qty: 60 | Fill #2 | Status: AC

## 2019-11-03 MED FILL — ALLOPURINOL 100 MG TABLET: 30 days supply | Qty: 60 | Fill #4 | Status: AC

## 2019-11-03 MED FILL — LOPERAMIDE 2 MG CAPSULE: 30 days supply | Qty: 240 | Fill #1 | Status: AC

## 2019-11-03 MED FILL — NITROGLYCERIN 0.4 MG SUBLINGUAL TABLET: SUBLINGUAL | 30 days supply | Qty: 100 | Fill #1

## 2019-11-03 MED FILL — ALLOPURINOL 100 MG TABLET: ORAL | 30 days supply | Qty: 60 | Fill #4

## 2019-11-03 MED FILL — ONDANSETRON HCL 4 MG TABLET: 8 days supply | Qty: 15 | Fill #0 | Status: AC

## 2019-11-03 MED FILL — RENVELA 800 MG TABLET: 30 days supply | Qty: 180 | Fill #4 | Status: AC

## 2019-11-03 MED FILL — HYDROXYZINE HCL 10 MG TABLET: 30 days supply | Qty: 30 | Fill #2 | Status: AC

## 2019-11-03 MED FILL — NITROGLYCERIN 0.4 MG SUBLINGUAL TABLET: 30 days supply | Qty: 100 | Fill #1 | Status: AC

## 2019-11-03 MED FILL — HYDROXYZINE HCL 10 MG TABLET: ORAL | 30 days supply | Qty: 30 | Fill #2

## 2019-11-04 DIAGNOSIS — I2510 Atherosclerotic heart disease of native coronary artery without angina pectoris: Principal | ICD-10-CM

## 2019-11-04 NOTE — Unmapped (Signed)
CCM2    Pt asks for a return call, wants to be seen today.    Spoke w/ pt , currently @ HD.    States feeling better now, has oxygen on and denies need for [facility].  States HD nurse said if he doesn't feel good to go to the ER.  NAD noted.    Appt made for f/u w/ PCP next week to discuss oxygen,

## 2019-11-05 NOTE — Unmapped (Signed)
Pt requests chlorhexidine swabs for port dressing changes prn.   Will notify provider.

## 2019-11-07 DIAGNOSIS — J449 Chronic obstructive pulmonary disease, unspecified: Principal | ICD-10-CM

## 2019-11-07 DIAGNOSIS — J45909 Unspecified asthma, uncomplicated: Principal | ICD-10-CM

## 2019-11-07 DIAGNOSIS — I2510 Atherosclerotic heart disease of native coronary artery without angina pectoris: Principal | ICD-10-CM

## 2019-11-07 NOTE — Unmapped (Signed)
CCM Intake Call    PCP Action:None     CCM Action: will follow up post appt 11/09/19    Patient Action:appt 11/09/19    Pt's wife called in to CCM asking why pt needs an appt this week.  Explained pt will need to be followoed up re: multiple concerns  Labs,v/s, meds  HF  DM  Pain  Pt requesting prn oxygen rx  Wife states pt is doing well, denies need for any rx refills.     Pt's wife hangs up the phone on CCM when CCM asks her to use a kinder tone. Wife is yelling regarding her own health, I don't trust you guys, I know I have a heart problem   Pt also admits to not taking her medications and needing refills.  Offered to help pt make an provider appt, pt denies need.  Encouraged pt to take psych meds. Pt denies need and states, No , it's just my heart- also states cardiologist recently said her heart is fine.

## 2019-11-07 NOTE — Unmapped (Signed)
I was the supervising physician in the delivery of the service. Darlette Dubow D Armella Stogner, MD

## 2019-11-07 NOTE — Unmapped (Signed)
I'm not sure either and is not usually done by patient, is done at dialysis center.  I will address with him at upcoming appointment.

## 2019-11-09 ENCOUNTER — Ambulatory Visit: Admit: 2019-11-09 | Discharge: 2019-11-10 | Disposition: A | Discharge status: Home / Self Care | Payer: MEDICARE

## 2019-11-09 ENCOUNTER — Ambulatory Visit: Admit: 2019-11-09 | Discharge: 2019-11-09 | Disposition: A | Discharge status: Home / Self Care | Payer: MEDICARE

## 2019-11-09 ENCOUNTER — Ambulatory Visit
Admit: 2019-11-09 | Discharge: 2019-11-09 | Disposition: A | Discharge status: Home / Self Care | Payer: MEDICARE | Attending: Nurse Practitioner | Primary: Nurse Practitioner

## 2019-11-09 ENCOUNTER — Emergency Department: Admit: 2019-11-09 | Discharge: 2019-11-09 | Disposition: A | Discharge status: Home / Self Care | Payer: MEDICARE

## 2019-11-09 DIAGNOSIS — Z794 Long term (current) use of insulin: Principal | ICD-10-CM

## 2019-11-09 DIAGNOSIS — E1122 Type 2 diabetes mellitus with diabetic chronic kidney disease: Secondary | ICD-10-CM

## 2019-11-09 DIAGNOSIS — N186 End stage renal disease: Principal | ICD-10-CM

## 2019-11-09 DIAGNOSIS — I471 Supraventricular tachycardia: Principal | ICD-10-CM

## 2019-11-09 DIAGNOSIS — N184 Chronic kidney disease, stage 4 (severe): Principal | ICD-10-CM

## 2019-11-09 DIAGNOSIS — I25118 Atherosclerotic heart disease of native coronary artery with other forms of angina pectoris: Principal | ICD-10-CM

## 2019-11-09 DIAGNOSIS — Z992 Dependence on renal dialysis: Principal | ICD-10-CM

## 2019-11-09 LAB — CBC W/ AUTO DIFF
BASOPHILS ABSOLUTE COUNT: 0.1 10*9/L (ref 0.0–0.1)
BASOPHILS RELATIVE PERCENT: 0.9 %
EOSINOPHILS ABSOLUTE COUNT: 0.2 10*9/L (ref 0.0–0.7)
HEMATOCRIT: 38.8 % (ref 38.0–50.0)
HEMOGLOBIN: 12.5 g/dL — ABNORMAL LOW (ref 13.5–17.5)
LYMPHOCYTES ABSOLUTE COUNT: 1.7 10*9/L (ref 0.7–4.0)
LYMPHOCYTES RELATIVE PERCENT: 18.5 %
MEAN CORPUSCULAR HEMOGLOBIN CONC: 32.1 g/dL (ref 30.0–36.0)
MEAN CORPUSCULAR HEMOGLOBIN: 27.3 pg (ref 26.0–34.0)
MEAN CORPUSCULAR VOLUME: 84.8 fL (ref 81.0–95.0)
MEAN PLATELET VOLUME: 8 fL (ref 7.0–10.0)
MONOCYTES ABSOLUTE COUNT: 0.6 10*9/L (ref 0.1–1.0)
MONOCYTES RELATIVE PERCENT: 5.9 %
NEUTROPHILS ABSOLUTE COUNT: 6.8 10*9/L (ref 1.7–7.7)
NEUTROPHILS RELATIVE PERCENT: 72.5 %
NUCLEATED RED BLOOD CELLS: 0 /100{WBCs} (ref <=4)
PLATELET COUNT: 208 10*9/L (ref 150–450)
RED BLOOD CELL COUNT: 4.57 10*12/L (ref 4.32–5.72)
RED CELL DISTRIBUTION WIDTH: 17.2 % — ABNORMAL HIGH (ref 12.0–15.0)

## 2019-11-09 LAB — BLOOD GAS CRITICAL CARE PANEL, VENOUS
BASE EXCESS VENOUS: -2.9 — ABNORMAL LOW (ref -2.0–2.0)
CALCIUM IONIZED VENOUS (MG/DL): 4.55 mg/dL (ref 4.40–5.40)
GLUCOSE WHOLE BLOOD: 144 mg/dL
HCO3 VENOUS: 22 mmol/L (ref 22–27)
HEMOGLOBIN BLOOD GAS: 12.6 g/dL — ABNORMAL LOW (ref 13.50–17.50)
LACTATE BLOOD VENOUS: 0.9 mmol/L (ref 0.5–1.8)
O2 SATURATION VENOUS: 88.7 % — ABNORMAL HIGH (ref 40.0–85.0)
PCO2 VENOUS: 42 mmHg (ref 40–60)
PH VENOUS: 7.35 (ref 7.32–7.43)
POTASSIUM WHOLE BLOOD: 4.4 mmol/L (ref 3.4–4.6)
SODIUM WHOLE BLOOD: 140 mmol/L (ref 135–145)

## 2019-11-09 LAB — HIGH-SENSITIVITY TROPONIN I - 6 HOUR: Lab: 34

## 2019-11-09 LAB — PROTIME: Coagulation tissue factor induced:Time:Pt:PPP:Qn:Coag: 12

## 2019-11-09 LAB — APTT: Coagulation surface induced:Time:Pt:PPP:Qn:Coag: 30.6

## 2019-11-09 LAB — COMPREHENSIVE METABOLIC PANEL
ALBUMIN: 3.1 g/dL — ABNORMAL LOW (ref 3.4–5.0)
ALT (SGPT): <7 U/L — ABNORMAL LOW (ref 10–49)
ANION GAP: 10 mmol/L (ref 5–14)
AST (SGOT): 11 U/L (ref <=34)
BILIRUBIN TOTAL: 0.4 mg/dL (ref 0.3–1.2)
BLOOD UREA NITROGEN: 25 mg/dL — ABNORMAL HIGH (ref 9–23)
BUN / CREAT RATIO: 5
CALCIUM: 8.9 mg/dL (ref 8.7–10.4)
CHLORIDE: 106 mmol/L (ref 98–107)
CO2: 22.3 mmol/L (ref 20.0–31.0)
CREATININE: 4.69 mg/dL — ABNORMAL HIGH
EGFR CKD-EPI AA MALE: 15 mL/min/{1.73_m2} — ABNORMAL LOW (ref >=60)
EGFR CKD-EPI NON-AA MALE: 13 mL/min/{1.73_m2} — ABNORMAL LOW (ref >=60)
GLUCOSE RANDOM: 147 mg/dL (ref 70–179)
POTASSIUM: 4.2 mmol/L (ref 3.4–4.5)
PROTEIN TOTAL: 7.2 g/dL (ref 5.7–8.2)
SODIUM: 138 mmol/L (ref 135–145)

## 2019-11-09 LAB — AST (SGOT): Aspartate aminotransferase:CCnc:Pt:Ser/Plas:Qn:: 11

## 2019-11-09 LAB — B-TYPE NATRIURETIC PEPTIDE: Natriuretic peptide.B:MCnc:Pt:Bld:Qn:: 511.3 — ABNORMAL HIGH

## 2019-11-09 LAB — HIGH SENSITIVITY TROPONIN I - 4 HOUR SERIAL: HIGH SENSITIVITY TROPONIN - DELTA (2-6H): 5 ng/L (ref <=7)

## 2019-11-09 LAB — NEUTROPHILS ABSOLUTE COUNT: Neutrophils:NCnc:Pt:Bld:Qn:Automated count: 6.8

## 2019-11-09 LAB — PROTIME-INR: PROTIME: 12 s (ref 10.5–13.5)

## 2019-11-09 LAB — HIGH SENSITIVITY TROPONIN - DELTA (0-2H): Lab: 0

## 2019-11-09 LAB — LACTATE BLOOD VENOUS: Lactate:SCnc:Pt:BldV:Qn:: 0.9

## 2019-11-09 MED ADMIN — acetaminophen (TYLENOL) tablet 650 mg: 650 mg | ORAL | @ 14:00:00 | Stop: 2019-11-09

## 2019-11-09 MED ADMIN — aspirin chewable tablet 324 mg: 324 mg | ORAL | @ 11:00:00 | Stop: 2019-11-09

## 2019-11-09 NOTE — Unmapped (Signed)
Patient is getting released upon keeping appt at 4 today just wanted to let you know and make sure that you see the notes from ED.

## 2019-11-09 NOTE — Unmapped (Addendum)
The [facility] Personal Health Advocate Pool received a case management referral message for this Value Care Priority patient. Message forwarded via in-basket message to: Complex Case Manager Reynaldo Minium, RN CCM for follow up with patient.      ----- Message from Shirlee Limerick, RN sent at 11/09/2019 11:53 AM EDT -----  Regarding: Value Care Patient Notification  (Directions: Please send to [facility] Personal Health Advocate Program Pool)    I recently cared for one of the Value Care patients. Patient needs discharge follow-up.     If you have any questions please contact me at [phone], [employer].

## 2019-11-09 NOTE — Unmapped (Signed)
Care Management  Initial Transition Planning Assessment    CM met with patient in pt room. Pt/visitors were not wearing hospital provided masks for the duration of the interaction with CM. CM was wearing hospital provided surgical mask and hospital provided eye protection. CM was not within 6 foot of the patient/visitors during this interaction.     Patient has been identified as a Runner, broadcasting/film/video.  Requested that a  timely follow-up appointment be scheduled and documented prior to discharge.  Longitudinal Plan of Care reviewed, the following information is noted for continuity of care: Pt is independent at baseline. He lives with his wife in [place]. He goes to [facility] [name] 1st shift and drives himself there. He used to have [facility] HHRN/PT about 2 months ago but services has stopped. Will not mind using HH with [facility] again if needed.      Home Safety Screening  Patient Questions:                    1.  Before the illness or injury that brought you to the ED, did you need someone to help you on a regular basis? Do you need people to help you with activities of daily living?  ? Yes- wife helps him out  2.  In the last 24 hours, have you needed more help than usual?  ? Yes- d/t chest pain  3.  Have you been hospitalized for one or more nights during the past six months?   ? Yes  4.  Have you been to an emergency room one or more times in the past six months?  ? Yes-per chart  5.  In general, do you have problems with your vision that cannot be corrected with glasses/contacts?  ? No  6.  In general, do you have problems with your memory?  ? No  7.  Do you take six or more different medications every day?   ? Yes - per chart  8. Do you have any trouble getting, taking, keeping up with or paying for your medications?   ? No  9   Have you had any falls over the past few weeks or are you concerned about falling?  ? No  10.  Are you having trouble accessing and/or preparing the meals you need?  ? No  11. Do you have wounds or bed ulcers?   ? No  12  Do you have access to transportation?                          Yes - pt's wife can drive him      Care Manager Questions:  Is the patient over the age of 37?  ? No  *Home Safety Evaluation endorsed 5 of the screening items and will update personal health advocate. Personal Health Advocate Pool contacted by In Basket message regarding post-acute services and collaboration around plan of care.    When patient/family asked, Were the services I provided/discussed with you today of value?, patient/family responded Y/N? Yes - thanked CM              General  Care Manager assessed the patient by : In person interview with patient, In person interview with family (Wife at the bedside)  Orientation Level: Oriented X4  Functional level prior to admission: Independent  Reason for referral: Discharge Planning, Other - specify in comments  Additional Information: ACO pt  Type of Residence: Mailing Address:  [address]  [place] [address]  Physical Address: [address]  [place], [place] [address]  Contacts: Accompanied by: Family member   Extended Emergency Contact Information  Primary Emergency Contact: Dung,[name]  Address: [address]           [place], [place] [address] [place] [employer] Phone: [phone]  Relation: Spouse  Secondary Emergency Contact: Skelly,Bridgette   [place]  Home Phone: [phone]  Mobile Phone: [phone]  Relation: Daughter  Patient Phone Number: [phone] (home)         Medical Provider(s): Kurtis Bushman, MD  Reason for Admission: Admitting Diagnosis:  No admission diagnoses are documented for this encounter.  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  Past Surgical History:   has a past surgical history that includes Knee surgery (Right, 09/23/2006); Cardiac catheterization (03/08/2007); Cystourethroscopy (12/31/2009 ); pr inser hart pacer xvenous atrial (N/A, 05/30/2015); pr ephys eval w/ ablation supravent arrhythmia (N/A, 07/19/2015); pr upper gi endoscopy,biopsy (N/A, 02/28/2016); pr upper gi endoscopy,diagnosis (N/A, 05/07/2016); pr cath place/coron angio, img super/interp,r&l hrt cath, l hrt ventric (N/A, 02/13/2017); pr cath place/coron angio, img super/interp,w left heart ventriculography (N/A, 03/17/2017); pr insert/place flow direct cath (N/A, 03/17/2017); pr ecmo/ecls initiation veno-arterial (N/A, 03/19/2017); pr rebl ves direct,low extrem (Left, 03/19/2017); pr ecmo/ecls rmvl prph cannula open 6 yrs & older (Left, 03/25/2017); pr remv art clot iliac-pop,leg incis (Left, 03/25/2017); pr negative pressure wound therapy dme >50 sq cm (Left, 03/25/2017); pr insj/rplcmt perm dfb w/trnsvns lds 1/dual chmbr (N/A, 05/04/2017); pr prq trluml coronary stent w/angio one art/brnch (N/A, 03/12/2018); pr cath place/coron angio, img super/interp,w left heart ventriculography (N/A, 05/12/2018); pr cath place/coron angio, img super/interp,w left heart ventriculography (N/A, 10/22/2018); Coronary stent placement; and pr cath place/coron angio, img super/interp,w left heart ventriculography (N/A, 08/19/2019).   Previous admit date: 08/18/2019    Primary Insurance- Payor: MEDICARE / Plan: MEDICARE PART A AND PART B / Product Type: *No Product type* /   Secondary Insurance ??? Secondary Insurance  MEDICAID [place]  Prescription Coverage ??? same as above  Preferred Pharmacy - [facility] - [place], [place] - [address]  [place] [facility]  [facility]  [facility] - [place] ([place]), [place] - [address]    Transportation home: Private vehicle    Contact/Decision Maker  Extended Emergency Contact Information  Primary Emergency Contact: Bullinger,[name]  Address: [address]           [place], [place] [address] [place] [employer] Phone: [phone]  Relation: Spouse  Secondary Emergency Contact: Hartzell,Bridgette   [place]  Home Phone: [phone]  Mobile Phone: [phone]  Relation: Daughter    Legal Next of Kin / Guardian / POA /  Advance Directives     [name] [facility]): Sobh,[name] - 1960    [name], First Alternate: Plott,Bridgette - Daughter - [phone]    Advance Directive (Medical Treatment)  Does patient have an advance directive covering medical treatment?: Patient has advance directive covering medical treatment, copy in chart.    Health Care Decision Maker [[name]] (Medical & Mental Health Treatment)  Healthcare Decision Maker: [name] documented in the [name]/Contact Info section.    Patient Information  Lives with: Spouse/significant other    Type of Residence: Private residence     Location/Detail: [place], [place]    Support Systems/Concerns: Children, Family Members, Spouse    Responsibilities/Dependents at home?: No    Home Care services in place prior to admission?: No    Equipment Currently Used at Home: cane, straight, walker, rolling, respiratory supplies (BiPAP)  Current HME Agency (Name/Phone #): [facility] for BiPAP supplies    Currently receiving outpatient dialysis?: Yes    [facility] [name] 1st shift- pt drives himself.    Financial Information    Need for financial assistance?: No    Social Determinants of Health  Social Determinants of Health     Tobacco Use: High Risk   ??? Smoking Tobacco Use: Never Smoker   ??? Smokeless Tobacco Use: Current User   Alcohol Use: Not At Risk   ??? How often do you have a drink containing alcohol?: 0   ??? How many drinks containing alcohol do you have on a typical day when you are drinking?: Not on file   ??? How often do you have 5 or more drinks on one occasion?: 0   Financial Resource Strain: Medium Risk   ??? Difficulty of Paying Living Expenses: Somewhat hard   Food Insecurity: Food Insecurity Present   ??? Worried About Programme researcher, broadcasting/film/video in the Last Year: Sometimes true   ??? Ran Out of Food in the Last Year: Sometimes true   Transportation Needs: No Transportation Needs   ??? Lack of Transportation (Medical): No   ??? Lack of Transportation (Non-Medical): No   Physical Activity:    ??? Days of Exercise per Week:    ??? Minutes of Exercise per Session:    Stress: Stress Concern Present   ??? Feeling of Stress : Rather much   Social Connections: Moderately Isolated   ??? Frequency of Communication with Friends and Family: More than three times a week   ??? Frequency of Social Gatherings with Friends and Family: Twice a week   ??? Attends Religious Services: Never   ??? Active Member of Clubs or Organizations: No   ??? Attends Banker Meetings: Never   ??? Marital Status: Married   Catering manager Violence: Not At Risk   ??? Fear of Current or Ex-Partner: No   ??? Emotionally Abused: No   ??? Physically Abused: No   ??? Sexually Abused: No   Depression: Not at risk   ??? PHQ-2 Score: 2   Housing/Utilities: High Risk   ??? Within the past 12 months, have you ever stayed: outside, in a car, in a tent, in an overnight shelter, or temporarily in someone else's home (i.e. couch-surfing)?: Yes   ??? Are you worried about losing your housing?: Yes   ??? Within the past 12 months, have you been unable to get utilities (heat, electricity) when it was really needed?: Yes   Substance Use:    ??? Taken prescription drugs for non-medical reasons:    ??? Taken illegal drugs:    ???  Patient indicated they have taken drugs in the past year, including Cannabis, Cocaine, Prescription stimulants, Methamphetamine, Inahalnts, Sedatives or sleeping pills, Hallucinogens, Street Opioids or Prescription opiods for non-medical reasons:    Health Literacy: Low Risk    ??? : Never     Discharge Needs Assessment  Concerns to be Addressed: adjustment to diagnosis/illness    Clinical Risk Factors: Principal Diagnosis: Cancer, Stroke, COPD, Heart Failure, AMI, Pneumonia, Joint Replacment    Barriers to taking medications: No    Prior overnight hospital stay or ED visit in last 90 days: Yes    Readmission Within the Last 30 Days: no previous admission in last 30 days    Anticipated Changes Related to Illness: none    Equipment Needed After Discharge: none    Discharge Facility/Level of Care Needs: other (see comments) (Home with self-care)    Readmission  Risk of Unplanned Readmission Score:  %  Predictive Model Details   No score data available for [facility] Risk of Unplanned Readmission     Readmitted Within the Last 30 Days? (No if blank)   Patient at risk for readmission?: Yes    Discharge Plan  Screen findings are: Care Manager reviewed the plan of the patient's care with the Multidisciplinary Team. No discharge planning needs identified at this time. Care Manager will continue to manage plan and monitor patient's progress with the team.    Expected Discharge Date: 11/09/2019    Expected Transfer from Critical Care:  (N/A)    Quality data for continuing care services shared with patient and/or representative?: N/A  Patient and/or family were provided with choice of facilities / services that are available and appropriate to meet post hospital care needs?: N/A    Initial Assessment complete?: Yes

## 2019-11-09 NOTE — Unmapped (Signed)
Dr. Hulan Fess from Internal medicine had her Nurse call [facility] Cardiology to see if we could see pt today.  We have no appts and I discussed with AYevette Edwards and he said have pt send a remote reading.  This was communicated with  Dr. Merri Brunette nurse.  Please see note from ER THIS AM:    12:30 PM  Interrogation revealed multiple episodes of atrial tachycardia that is leading to his symptoms. Cardiology recommends medication changes to be discussed with his primary cardiologist. Jeremy Oliver patient, who feels significantly improved and expresses desire to go home since he has an appointment with his PCP later this afternoon. They express understanding that he needs to bring this up to his PCP. Given resolution of his symptoms I feel it is reasonable to do this outpatient -I have offered he and his wife admission for monitoring and medication changes as an inpatient, but they request discharge. I discussed my evaluation of the patient's symptoms, my clinical impression, and my proposed outpatient treatment plan. We have discussed anticipatory guidance, scheduled follow-up, and careful return precautions. I provided an opportunity and answered any questions the patient had. The patient expresses understanding and is comfortable with the discharge plan.

## 2019-11-09 NOTE — Unmapped (Shared)
[facility]  Emergency Department Progress Note    November 09, 2019 7:05 AM         Recent VS:  Recent Patient Data       11/09/2019  0637 11/09/2019  0639 11/09/2019  0654       BP:  ???  143/73  ???     Pulse:  117  140  87     Resp:  22  13  15      SpO2:  ???  ???  95 %         Amron Marolt Sanz is a 49 y.o. male with past medical history of asthma, end-stage renal disease on dialysis, A. fib, CAD, diabetes, reflux, high cholesterol, hypertension, CHF with EF of around 45% who is presenting for evaluation of chest pain and general feeling of malaise. Woke up at 0530 feeling unwell with chest pressure and took 1 NTG, had one episode of emesis and presented here for further evaluation. Scheduled for dialysis today. Denies fever, chills, cough, shortness of breath, abdominal pain, urinary symptoms, numbness, tingling, weakness, or changes in bowel movements.        Pt had syncopal episode with shifting movements and was noted to be in a. fib RVR with rates in 150s-160s. He was given a full dose aspirin here. VBG, CBC, and CMP do not appear different from baseline. Pending serial hsTroponin, coags, and CXR.    8:50 AM  Initial hsTroponin negative, repeat troponin also negative. CXR shows heterogeneous bibasilar airspace opacities. On my reassessment patient reports his chest pressure is resolved, but now has a headache. Will order Tylenol. Notes he did not feel his defibrillator fire when this happened.     9:46 AM  Discussed case with Cardiology, who will send Cardiology nurse to interrogate pacemaker in ED.    12:30 PM  Interrogation revealed multiple episodes of atrial tachycardia that is leading to his symptoms. Cardiology recommends medication changes to be discussed with his primary cardiologist. Kerby Less patient, who feels significantly improved and expresses desire to go home since he has an appointment with his PCP later this afternoon. They express understanding that he needs to bring this up to his PCP. Given resolution of his symptoms I feel it is reasonable to do this outpatient -I have offered he and his wife admission for monitoring and medication changes as an inpatient, but they request discharge. I discussed my evaluation of the patient's symptoms, my clinical impression, and my proposed outpatient treatment plan. We have discussed anticipatory guidance, scheduled follow-up, and careful return precautions. I provided an opportunity and answered any questions the patient had. The patient expresses understanding and is comfortable with the discharge plan.      ??   Additional Medical Decision Making   ??  I have reviewed the patient's vital signs and the nursing notes. Any pertinent labs & imaging results which were available during my care of the patient were reviewed by me.     I directly visualized and independently interpreted the EKG tracing.   I independently visualized the radiology images.   I reviewed the patient's prior medical records.   I discussed the case with the Cardiology consultant.   I obtained additional history from wife at bedside.     [facility] hsTroponin - HEART Score   ED HsTn ACS Risk Stratification Pathway    {-TIP-  Use this smartphrase for calculating Heart Score with hs Troponin ONLY.  Please refer to linked pathway  above.   Tip text in blue is for reference only and will not be included in your signed note.  :[id number]}  History:    1 = Moderately Suspicious    EKG:      {TIP - EKG Changes    Non-specific repolarization disturbance  No ST deviation but any of the following,   ?? LBBB  ?? LVH  ?? Repolarization Changes (e.g. digoxin)    Significant ST Deviation   ?? Not due to LBBB, LVH, or digoxin   :[id number]} 0 = Normal    Age:   {TIP -  Patient Age - 49 y.o.  :[id number]} 7 = 24-34 years old    Risk Factors:   {TIP - REFERENCE Risk Factors -    Risk Factors  HTN  Hypercholesterolemia  DM  Obesity (BMI > 30kg/m2)  Smoking (current or smoking cessation less than or equal to 3 mo)  Positive family history (parent or sibling with CVD before age 17)     Atherosclerotic Disease  Prior MI  PCI/CABG  CVA/TIA   Peripheral arterial disease     :[id number]} 2 = 3 or MORE risk factors OR history of atherosclerotic disease   Troponin:  {TIP - hsTroponin - Normal limits by sex   Male  0 =  Normal limit (females  34 ng/L)   1 = 1-3x Normal limit (females 35 -102 ng/L)   2 = > 3x Normal limit ( females >102 ng/L)   Male  0  =  Normal limit (males  53 ng/L)  1 = 1-3x Normal limit (males 54-159 ng/L)  2 = > 3x Normal limit  (males >159 ng/L)   :[id number]}   0 = Normal limit or less   HEART Score Total: 4     {TIP- Disposition - Guidance ED HsTn ACS Risk Stratification Pathway  :[id number]}     Disposition Decision Making: Outpatient PCP follow-up and Outpatient cardiac evaluation     Portions of this record have been created using Dragon dictation software. Dictation errors have been sought, but Sagraves not have been identified and corrected.    ??   ED Clinical Impression       Final diagnoses:   Atrial tachycardia (CMS-[name]) (Primary)   Type 2 diabetes mellitus with stage 4 chronic kidney disease, with long-term current use of insulin (CMS-[name])   Coronary artery disease of native artery of native heart with stable angina pectoris (CMS-[name])         Documentation assistance was provided by Mliss Sax, Scribe, on November 09, 2019 at 7:10 AM for Terese Door, MD.    Documentation assistance was provided by the scribe in my presence.  The documentation recorded by the scribe has been reviewed by me and accurately reflects the services I personally performed.    Precious Gilding, MD  November 09, 2019 1:00 PM

## 2019-11-09 NOTE — Unmapped (Signed)
ICD Interrogation  RE: routine    Manufacturer: Advertising copywriter: VIGILANT EL ICD D233  Type of Device: Theme park manager MD: DR. Excell Seltzer  Indication for implant: VT w/ cardiac arrest    Battery: 11 yearsestimated longevity    Mode: DDD  Lower rate limit 70 bpm  Upper rate: 130 bpm  Tachy Settings:      Presenting Rhythm: AS/VS  Underlying rhythm: NSR   Dependent:  No    Pacing Percentages  AP: 12%  RVP: 0%    Impedence (ohms)  Atrial lead 600  Right Ventricular lead 289   HV/SVC 46     Sensing (mV)  (P)Atrial lead 6.3  (R)Ventricular lead 16.9     Capture threshold (V @ ms)  Atrial lead 0.80V @ 0.73ms  Right Ventricular lead 1.0V @ 0.80ms       Diagnostics/Episodes  ?? AF Burden-2%  ?? 10-NSVT all on 11/09/2019- longest 43 seconds  ?? 10-ATR  ?? 3-VT-1 (No therapy)  All of these episodes appear to be atrial driven arrhythmias RVR      Fluid Monitoring  ?? No Evidence of Fluid Accumulation     Reprogramming  ?? None    See PDF in media tab for full interrogation record    Questions, please call the Ep Device Rn via Vocera or dial ext [phone]  Ep Device clinic [phone]

## 2019-11-09 NOTE — Unmapped (Signed)
Left vm for pt: follow up re: pt will come to PCP appt today.  Wife, [name] also confirmed.

## 2019-11-09 NOTE — Unmapped (Signed)
Called to front pt having chest pain, sob, when getting into stretcher patient got very sob and light headed. Pt had syncopal episode during triage. When laid flat patient able to speak and answer questions. Pt states bp was really low this am.

## 2019-11-09 NOTE — Unmapped (Unsigned)
Referring Provider: Roma Schanz, MD  [address]  [address]  [place],  [place] [address]   Primary Provider: Kurtis Bushman, MD  [address]  [place] [address]   Other Providers: Dr Andrey Farmer, Dr Claudia Pollock EP Follow Up Note    Reason for Visit:  Jeremy Oliver is a 49 y.o. male being seen for urgent visit for atrial fibrillation.    Assessment & Plan:    1. Paroxysmal Atrial Fibrillation  CHA2DS2-VASc - 3 (cad, chf, dm)  eliquis 5mg  bid  Amiodarone 200mg    Switch from carvedilol 25mg  bid to metoprolol succinate 100mg  daily for better rate control. Will likely benefit from increase in metoprolol in the future if he tolerates.    2. HFrEF  s/p Medtronic dual chamber ICD  Pacemaker/ICD checked by device nurse  I have reviewed and agree with the findings  Normal pacemaker function  A pace - 4%  V pace - <1%  Events - Many Atach, Afib with RVR events over past 24 hours. No therapies given. See previous device RN notes for full description of events of past 24 hours.  Battery - 11 years  Underlying Rhythm - SR  Continue remotes       Follow-up:  Return for previously scheduled with Dr Andrey Farmer.      History of Present Illness:  Jeremy Oliver is a 49 y.o. male with extensive past medical history who presents urgently from his PCP after ED evaluation this morning for tachycardia and syncope.      Interval History: woke up this morning not feeling well. BP low, 60/30 at home. HR in 90's. BP dropped more, drank pepsi, felt better then BP and HR went sky high. He presented to the ED at [facility] as he continued to feel poorly. While at ED he had brief syncopal spell and his device was interrogated showing multiple brief episodes of AF/AT. By the time of his discharge he was feeling back to normal and continues to feel normal currently.         Cardiovascular History & Procedures:    Cath / PCI:  ?? 08/19/19 LHC  1. There is??significant 2-vessel coronary artery disease. ??There is a 90% proximal LAD stenosis which extends into the proximal-LAD stent and 80% proximal LCx stenosis. The mid-LAD stent has mild ISR. The mid and distal RCA stents are widely patent. The RCA has moderate diffuse distal disease.   2. Elevated??left ventricular filling pressures (LVEDP = 24??mm Hg).  3. Successful PCI to the??LAD and LCx??with the placement of a Synergy DES x2??with excellent angiographic result and TIMI 3 flow.  4. Rescue balloon angioplasty of the DIAG2 vessel that was occluded after dilation of the LAD stent    ?? 10/22/2018 LHC  1.   Significant 90% stenosis of distal RCA, severe proximal LCx stenosis, and diffuse severe distal LAD stenosis  2.   Elevated LVEDP = 22   3.   Successful DES to the distal RCA with the placement of a Synergy 4.0 x 20 with excellent angiographic result and TIMI 3 flow.    ?? 05/12/2018 LHC  5. Coronary artery disease including diffuse moderate to severe lesions in the left coronary system, with particular emphasis on the 90% proximal to mid LAD in-stent restenosis.  6. Successful PCI to the proximal to mid LAD with the placement of a Synergy drug eluting stent with excellent angiographic result and TIMI 3 flow.  7. Subsequent impaired flow at an adjacent moderate diagonal, which was small and had pre-existing ostial disease. Recrossed and treated with balloon angioplasty with restored flow.  8. Recent stent to RPDA/RPLA bifurcation widely patent.  9. Normal left ventricular filling pressures (LVEDP = 8 mm Hg).    ?? 03/12/2018 LHC  1. Coronary artery disease including??severe mid RCA, and distal RCA bifurcation stenosis.  2. Left heart cath deferred  3. Successful??PCI??to the mid RCA and RPLA/RPDA bifurcation??with the placement of a Synergy DES x??with excellent angiographic result and TIMI 3 flow.    ?? 03/17/2017 LHC  ?? Significant 2-vessel??coronary artery disease.90% stenosis of the proximal LAD with TIMI 1 flow into the distal vessel. There is a 70% stenosis of the ostial DIAG2 vessel which was treated with balloon angiogplasty. There is a 70% stenosis of the proximal LCx and there is mild-moderate RCA disease.   ?? Decreased??LV Function by previous echo ??EF estimated 30%. LVEDP 35 mm Hg  ?? PCI??was performed of the??proximal and mid 90%??LAD lesion using Resolute Onyx (drug eluting stent)??x 2. Good??angiographic result with TIMI??3??flow  ?? Impella was placed for cardiogenic shock prior to PCI  ?? Right heart cath after Impella placement- RA mean 11, RV 40/8, PA 41/21 mean 29, ??PCWP mean 12  ?? PA sat 58, Fick CO/CI 4.6/1.7  ?? **    CV Surgery:  ?? **    EP Procedures and Devices:  ?? 05/04/2017 Medtronic dual chamber ICD generator change  ?? **    Non-Invasive Evaluation(s):  Echo:  ?? 09/22/2019 TTE    1. Technically difficult study.    2. The left ventricle is moderately to severely dilated in size with normal wall thickness.    3. Left ventricular systolic function severely decreased, LVEF is visually estimated at 35%.    4. Grade III diastolic dysfunction (severely elevated filling pressure).    5. The left atrium is severely dilated in size.    6. Right ventricle is upper normal in size, with reduced systolic  ?? function.    ?? **    Cardiac CT/MRI/Nuclear Tests:  ?? **                 Other Past Medical History:  See below for the complete EPIC list of past medical and surgical history.      Allergies:  Losartan and Opioids - morphine analogues    Current Medications:    Current Outpatient Medications on File Prior to Visit   Medication Sig   ??? acetaminophen (TYLENOL) 500 MG tablet TAKE 2 TABLETS (1 000MG ) BY MOUTH EVERY 8 HOURS AS NEEDED FOR PAIN   ??? albuterol HFA 90 mcg/actuation inhaler Inhale 2 puffs into the lungs every 6 (six) hours as needed for wheezing or shortness of breath.   ??? alirocumab (PRALUENT) 150 mg/mL subcutaneous injection Inject the contents of 1 pen (150 mg total) under the skin every fourteen (14) days.   ??? allopurinoL (ZYLOPRIM) 100 MG tablet Take 2 tablets (200 mg total) by mouth daily.   ??? amiodarone (PACERONE) 200 MG tablet TAKE 1 TABLET BY MOUTH ONCE DAILY   ??? amLODIPine (NORVASC) 10 MG tablet Take 10 mg by mouth daily.   ??? apixaban (ELIQUIS) 5 mg Tab Take 1 tablet (5 mg total) by mouth Two (2) times a day.   ??? atorvastatin (LIPITOR) 80 MG tablet Take 1 tablet (80 mg total) by mouth daily.   ??? bumetanide (BUMEX) 2 MG tablet Take 1 tablet (2 mg  total) by mouth two (2) times a day. On non dialysis days   ??? carvediloL (COREG) 25 MG tablet Take 1 tablet (25 mg total) by mouth Two (2) times a day. HOLD morning dose on dialysis days   ??? cholecalciferol, vitamin D3 25 mcg, 1,000 units,, 1,000 unit (25 mcg) tablet Take 1 tablet (25 mcg total) by mouth daily.   ??? diclofenac sodium (VOLTAREN) 1 % gel Apply 2 grams topically Three (3) times a day.   ??? ezetimibe (ZETIA) 10 mg tablet TAKE 1 TABLET BY MOUTH ONCE DAILY   ??? FLUoxetine (PROZAC) 40 MG capsule Take 2 capsules (80 mg total) by mouth daily. (Patient taking differently: Take 80 mg by mouth nightly. )   ??? gabapentin (NEURONTIN) 300 MG capsule Take 1 capsule (300 mg total) by mouth two (2) times a day.   ??? hydrOXYzine (ATARAX) 10 MG tablet Take 1 tablet (10 mg total) by mouth nightly as needed for anxiety.   ??? isosorbide mononitrate (IMDUR) 30 MG 24 hr tablet Take 1 tablet (30 mg total) by mouth daily. DO NOT TAKE on dialysis days   ??? loperamide (IMODIUM) 2 mg capsule Take 1 capsule twice a day and another capsule after each episode of diarrhea, up to 8 pills per day.   ??? omeprazole (PRILOSEC) 40 MG capsule Take 1 capsule (40 mg total) by mouth Two (2) times a day (30 minutes before a meal).   ??? ondansetron (ZOFRAN) 4 MG tablet Take 1 tablet (4 mg total) by mouth every twelve (12) hours as needed.   ??? semaglutide (OZEMPIC) 0.25 mg or 0.5 mg(2 mg/1.5 mL) PnIj injection Inject 0.5 mg under the skin every seven (7) days.   ??? sevelamer (RENVELA) 800 mg tablet TAKE 2 TABLETS (1600MG ) BY MOUTH THREE TIMES DAILY WITH MEALS   ??? ticagrelor (BRILINTA) 90 mg Tab TAKE 1 TABLET BY MOUTH TWICE DAILY   ??? blood sugar diagnostic Strp Use as directed to check blood glucose Four (4) times a day (before meals and nightly).   ??? blood-glucose meter kit Disp. blood glucose meter kit preferred by patient's insurance. Dx: Diabetes, E11.9   ??? empty container (SHARPS-A-GATOR DISPOSAL SYSTEM) Misc Use as directed for sharps disposal   ??? insulin ASPART (NOVOLOG FLEXPEN U-100 INSULIN) 100 unit/mL (3 mL) injection pen Inject 15 Units under the skin Three (3) times a day before meals.   ??? insulin detemir U-100 (LEVEMIR FLEXTOUCH U-100 INSULN) 100 unit/mL (3 mL) injection pen Inject 37 units total under the skin nightly   ??? insulin syringe-needle U-100 0.3 mL 31 gauge x 5/16 Syrg Use to inject insulin twice daily with meals   ??? lancets Misc Use as directed to check blood glucose Four (4) times a day (before meals and nightly).   ??? miscellaneous medical supply (BLOOD PRESSURE CUFF) Misc Measure BP once a day or if any symptoms   ??? nitroglycerin (NITROSTAT) 0.4 MG SL tablet Place 1 tablet (0.4 mg total) under the tongue every five (5) minutes as needed for chest pain.   ??? UNIFINE PENTIPS 31 gauge x 5/16 (8 mm) Ndle USE WITH INSULIN AND VICTOZA 5 TIMES DAILY   ??? [DISCONTINUED] liraglutide (VICTOZA 2-PAK) 0.6 mg/0.1 mL (18 mg/3 mL) injection Inject 0.1 mL (0.6 mg total) under the skin daily.   ??? [DISCONTINUED] ranolazine (RANEXA) 500 MG 12 hr tablet Take by mouth two (2) times a day.   ??? [DISCONTINUED] traZODone (DESYREL) 50 MG tablet Take 1 tablet (50 mg total) by  mouth nightly.     Current Facility-Administered Medications on File Prior to Visit   Medication   ??? [COMPLETED] acetaminophen (TYLENOL) tablet 650 mg         Family History:  The patient's family history includes Basal cell carcinoma in his mother; Blindness in his mother; Depression in his maternal uncle; Diabetes in his mother; Gallbladder disease in his half-brother; Heart disease in his father, half-sister, and mother; Hyperlipidemia in his father and mother; Kidney disease in his half-sister; Lung disease in his father; No Known Problems in his daughter and son; Obesity in his daughter, half-brother, and mother.    Social history:  He  reports that he has never smoked. His smokeless tobacco use includes chew. He reports that he does not drink alcohol and does not use drugs.    Review of Systems:  As per HPI and as follows.  Rest of the review of ten systems is negative or unremarkable except as stated above.    Physical Exam:  VITAL SIGNS:   Vitals:    11/09/19 1629   BP: 140/94   Pulse: 82   SpO2:       Wt Readings from Last 3 Encounters:   09/08/19 (!) 165.5 kg (364 lb 12.8 oz)   09/07/19 (!) 163.4 kg (360 lb 3.7 oz)   08/20/19 (!) 167.3 kg (368 lb 13.3 oz)      Today's There is no height or weight on file to calculate BMI.        GENERAL: obese, chronically ill-appearing in no acute distress in wheelchair  HEENT: Normocephalic and atraumatic. Conjunctivae and sclerae clear and anicteric.    NECK: Supple.   CARDIOVASCULAR: Rate and rhythm are regular.     RESPIRATORY: Normal respiratory effort..  There are no wheezes.  ABDOMEN: Soft, non-tender, Abdomen nondistended.    EXTREMITIES:  There is no pedal edema, bilaterally.   SKIN: No rashes, ecchymosis or petechiae.  Warm, well perfused. Well healed left sided  Pacemaker/ICD scar   NEURO/PSYCH: Alert and oriented x 3. Affect appropriate.  Nonfocal    Pertinent Laboratory Studies:   Admission on 11/09/2019, Discharged on 11/09/2019   Component Date Value Ref Range Status   ??? EKG Ventricular Rate 11/09/2019 87  BPM Final   ??? EKG Atrial Rate 11/09/2019 87  BPM Final   ??? EKG P-R Interval 11/09/2019 202  ms Final   ??? EKG QRS Duration 11/09/2019 94  ms Final   ??? EKG Q-T Interval 11/09/2019 416  ms Final   ??? EKG QTC Calculation 11/09/2019 500  ms Final   ??? EKG Calculated P Axis 11/09/2019 60  degrees Final   ??? EKG Calculated R Axis 11/09/2019 -12  degrees Final   ??? EKG Calculated T Axis 11/09/2019 137  degrees Final   ??? QTC Fredericia 11/09/2019 470  ms Final   ??? Sodium 11/09/2019 138  135 - 145 mmol/L Final   ??? Potassium 11/09/2019 4.2  3.4 - 4.5 mmol/L Final   ??? Chloride 11/09/2019 106  98 - 107 mmol/L Final   ??? Anion Gap 11/09/2019 10  5 - 14 mmol/L Final   ??? CO2 11/09/2019 22.3  20.0 - 31.0 mmol/L Final   ??? BUN 11/09/2019 25* 9 - 23 mg/dL Final   ??? Creatinine 11/09/2019 4.69* 0.60 - 1.10 mg/dL Final   ??? BUN/Creatinine Ratio 11/09/2019 5   Final   ??? EGFR CKD-EPI Non-African American,* 11/09/2019 13* >=60 mL/min/1.25m2 Final   ??? EGFR CKD-EPI African  American, Male 11/09/2019 15* >=60 mL/min/1.82m2 Final   ??? Glucose 11/09/2019 147  70 - 179 mg/dL Final   ??? Calcium 16/12/9602 8.9  8.7 - 10.4 mg/dL Final   ??? Albumin 54/11/8117 3.1* 3.4 - 5.0 g/dL Final   ??? Total Protein 11/09/2019 7.2  5.7 - 8.2 g/dL Final   ??? Total Bilirubin 11/09/2019 0.4  0.3 - 1.2 mg/dL Final   ??? AST 14/78/2956 11  <=34 U/L Final   ??? ALT 11/09/2019 <7* 10 - 49 U/L Final   ??? Alkaline Phosphatase 11/09/2019 116  46 - 116 U/L Final   ??? hsTroponin I 11/09/2019 39  <=53 ng/L Final   ??? delta hsTroponin I 11/09/2019    Final   ??? PT 11/09/2019 12.0  10.5 - 13.5 sec Final   ??? INR 11/09/2019 1.01   Final   ??? APTT 11/09/2019 30.6  25.3 - 37.1 sec Final   ??? Heparin Correlation 11/09/2019 0.2   Final   ??? WBC 11/09/2019 9.4  3.5 - 10.5 10*9/L Final   ??? RBC 11/09/2019 4.57  4.32 - 5.72 10*12/L Final   ??? HGB 11/09/2019 12.5* 13.5 - 17.5 g/dL Final   ??? HCT 21/30/8657 38.8  38.0 - 50.0 % Final   ??? MCV 11/09/2019 84.8  81.0 - 95.0 fL Final   ??? MCH 11/09/2019 27.3  26.0 - 34.0 pg Final   ??? MCHC 11/09/2019 32.1  30.0 - 36.0 g/dL Final   ??? RDW 84/69/6295 17.2* 12.0 - 15.0 % Final   ??? MPV 11/09/2019 8.0  7.0 - 10.0 fL Final   ??? Platelet 11/09/2019 208  150 - 450 10*9/L Final   ??? nRBC 11/09/2019 0  <=4 /100 WBCs Final   ??? Neutrophils % 11/09/2019 72.5  % Final   ??? Lymphocytes % 11/09/2019 18.5  % Final   ??? Monocytes % 11/09/2019 5.9  % Final   ??? Eosinophils % 11/09/2019 2.2  % Final   ??? Basophils % 11/09/2019 0.9  % Final   ??? Absolute Neutrophils 11/09/2019 6.8  1.7 - 7.7 10*9/L Final   ??? Absolute Lymphocytes 11/09/2019 1.7  0.7 - 4.0 10*9/L Final   ??? Absolute Monocytes 11/09/2019 0.6  0.1 - 1.0 10*9/L Final   ??? Absolute Eosinophils 11/09/2019 0.2  0.0 - 0.7 10*9/L Final   ??? Absolute Basophils 11/09/2019 0.1  0.0 - 0.1 10*9/L Final   ??? Anisocytosis 11/09/2019 Slight* Not Present Final   ??? hsTroponin I 11/09/2019 34  <=53 ng/L Final   ??? delta hsTroponin I 11/09/2019 5  <=7 ng/L Final   ??? Specimen Source 11/09/2019 Venous   Final   ??? FIO2 Venous 11/09/2019 Room Air   Final   ??? pH, Venous 11/09/2019 7.35  7.32 - 7.43 Final   ??? pCO2, Ven 11/09/2019 42  40 - 60 mm Hg Final   ??? pO2, Ven 11/09/2019 58* 30 - 55 mm Hg Final   ??? HCO3, Ven 11/09/2019 22  22 - 27 mmol/L Final   ??? Base Excess, Ven 11/09/2019 -2.9* -2.0 - 2.0 Final   ??? O2 Saturation, Venous 11/09/2019 88.7* 40.0 - 85.0 % Final   ??? Sodium Whole Blood 11/09/2019 140  135 - 145 mmol/L Final   ??? Potassium, Bld 11/09/2019 4.4  3.4 - 4.6 mmol/L Final   ??? Calcium, Ionized Venous 11/09/2019 4.55  4.40 - 5.40 mg/dL Final   ??? Glucose Whole Blood 11/09/2019 144  Undefined mg/dL Final   ??? Lactate, Venous 11/09/2019  0.9  0.5 - 1.8 mmol/L Final   ??? Hgb, blood gas 11/09/2019 12.60* 13.50 - 17.50 g/dL Final   ??? BNP 16/12/9602 511.30* <=100 pg/mL Final   Admission on 09/14/2019, Discharged on 09/14/2019   Component Date Value Ref Range Status   ??? PRO-BNP 09/14/2019 8,740.0* 0.0 - 138.0 pg/mL Final   ??? Troponin I 09/14/2019 <0.060  <0.060 ng/mL Final   ??? Sodium 09/14/2019 139  135 - 145 mmol/L Final   ??? Potassium 09/14/2019 4.1  3.5 - 5.1 mmol/L Final   ??? Chloride 09/14/2019 105  98 - 107 mmol/L Final   ??? Anion Gap 09/14/2019 8  3 - 11 mmol/L Final   ??? CO2 09/14/2019 26.2  20.0 - 31.0 mmol/L Final   ??? BUN 09/14/2019 32* 9 - 23 mg/dL Final   ??? Creatinine 09/14/2019 4.19* 0.60 - 1.10 mg/dL Final   ??? BUN/Creatinine Ratio 09/14/2019 8   Final   ??? EGFR CKD-EPI Non-African American,* 09/14/2019 15  mL/min/1.53m2 Final   ??? EGFR CKD-EPI African American, Male 09/14/2019 18  mL/min/1.12m2 Final   ??? Glucose 09/14/2019 108  70 - 179 mg/dL Final   ??? Calcium 54/11/8117 8.4* 8.7 - 10.4 mg/dL Final   ??? Albumin 14/78/2956 2.4* 3.4 - 5.0 g/dL Final   ??? Total Protein 09/14/2019 6.8  5.7 - 8.2 g/dL Final   ??? Total Bilirubin 09/14/2019 0.3  0.3 - 1.2 mg/dL Final   ??? AST 21/30/8657 9  <34 U/L Final   ??? ALT 09/14/2019 8* 10 - 49 U/L Final   ??? Alkaline Phosphatase 09/14/2019 103  46 - 116 U/L Final   ??? EKG Ventricular Rate 09/14/2019 81  BPM Final   ??? EKG Atrial Rate 09/14/2019 81  BPM Final   ??? EKG P-R Interval 09/14/2019 224  ms Final   ??? EKG QRS Duration 09/14/2019 92  ms Final   ??? EKG Q-T Interval 09/14/2019 450  ms Final   ??? EKG QTC Calculation 09/14/2019 522  ms Final   ??? EKG Calculated P Axis 09/14/2019 41  degrees Final   ??? EKG Calculated R Axis 09/14/2019 -4  degrees Final   ??? EKG Calculated T Axis 09/14/2019 126  degrees Final   ??? QTC Fredericia 09/14/2019 497  ms Final   ??? WBC 09/14/2019 9.7  3.5 - 10.5 10*9/L Final   ??? RBC 09/14/2019 3.42* 4.32 - 5.72 10*12/L Final   ??? HGB 09/14/2019 9.2* 13.5 - 17.5 g/dL Final   ??? HCT 84/69/6295 28.0* 38.0 - 50.0 % Final   ??? MCV 09/14/2019 82.1  81.0 - 95.0 fL Final   ??? MCH 09/14/2019 26.8  26.0 - 34.0 pg Final   ??? MCHC 09/14/2019 32.7  30.0 - 36.0 g/dL Final   ??? RDW 28/41/3244 16.9* 12.0 - 15.0 % Final   ??? MPV 09/14/2019 8.1  7.0 - 10.0 fL Final   ??? Platelet 09/14/2019 304  150 - 450 10*9/L Final   ??? nRBC 09/14/2019 0  <=4 /100 WBCs Final   ??? Neutrophils % 09/14/2019 75.6  % Final   ??? Lymphocytes % 09/14/2019 14.5  % Final   ??? Monocytes % 09/14/2019 7.7  % Final   ??? Eosinophils % 09/14/2019 1.6  % Final   ??? Basophils % 09/14/2019 0.6  % Final   ??? Absolute Neutrophils 09/14/2019 7.3  1.7 - 7.7 10*9/L Final   ??? Absolute Lymphocytes 09/14/2019 1.4  0.7 - 4.0 10*9/L Final   ??? Absolute Monocytes 09/14/2019  0.8 0.1 - 1.0 10*9/L Final   ??? Absolute Eosinophils 09/14/2019 0.2  0.0 - 0.7 10*9/L Final   ??? Absolute Basophils 09/14/2019 0.1  0.0 - 0.1 10*9/L Final   Office Visit on 09/08/2019   Component Date Value Ref Range Status   ??? EKG Ventricular Rate 09/08/2019 85  BPM Final   ??? EKG Atrial Rate 09/08/2019 85  BPM Final   ??? EKG P-R Interval 09/08/2019 212  ms Final   ??? EKG QRS Duration 09/08/2019 92  ms Final   ??? EKG Q-T Interval 09/08/2019 418  ms Final   ??? EKG QTC Calculation 09/08/2019 497  ms Final   ??? EKG Calculated P Axis 09/08/2019 36  degrees Final   ??? EKG Calculated R Axis 09/08/2019 -13  degrees Final   ??? EKG Calculated T Axis 09/08/2019 172  degrees Final   ??? QTC Fredericia 09/08/2019 469  ms Final   No results displayed because visit has over 200 results.      No results displayed because visit has over 200 results.      Office Visit on 07/25/2019   Component Date Value Ref Range Status   ??? Sodium, POCT 07/25/2019 134* 135 - 145 mmol/L Final   ??? Potassium, POCT 07/25/2019 5.3* 3.5 - 5.0 mmol/L Final   ??? Chloride, POCT 07/25/2019 103  98 - 107 mmol/L Final   ??? CO2, POCT 07/25/2019 19* 22 - 30 mmol/L Final   ??? BUN, POCT 07/25/2019 56* 7 - 21 mg/dL Final   ??? Creatinine, POCT 07/25/2019 5.8* 0.7 - 1.3 mg/dL Final   ??? Glucose, POCT 07/25/2019 212* 70 - 179 mg/dL Final   ??? Calcium, POCT 07/25/2019 8.6  8.5 - 10.2 mg/dL Final   Office Visit on 07/22/2019   Component Date Value Ref Range Status   ??? PRO-BNP 07/25/2019 5,170.0* 0.0 - 138.0 pg/mL Final   ??? Magnesium 07/22/2019 1.9  1.6 - 2.6 mg/dL Final   ??? Sodium, POCT 07/22/2019 136  135 - 145 mmol/L Final   ??? Potassium, POCT 07/22/2019 5.5* 3.5 - 5.0 mmol/L Final   ??? Chloride, POCT 07/22/2019 100  98 - 107 mmol/L Final   ??? CO2, POCT 07/22/2019 22  22 - 30 mmol/L Final   ??? BUN, POCT 07/22/2019 57* 7 - 21 mg/dL Final   ??? Creatinine, POCT 07/22/2019 6.2* 0.7 - 1.3 mg/dL Final   ??? Glucose, POCT 07/22/2019 223* 70 - 179 mg/dL Final   ??? Calcium, POCT 07/22/2019 8.1* 8.5 - 10.2 mg/dL Final   Office Visit on 07/20/2019   Component Date Value Ref Range Status   ??? Magnesium 07/20/2019 1.7  1.6 - 2.6 mg/dL Final   ??? PRO-BNP 13/10/6576 6,530.0* 0.0 - 138.0 pg/mL Final   ??? Sodium, POCT 07/20/2019 134* 135 - 145 mmol/L Final   ??? Potassium, POCT 07/20/2019 5.0  3.5 - 5.0 mmol/L Final   ??? Chloride, POCT 07/20/2019 98  98 - 107 mmol/L Final   ??? CO2, POCT 07/20/2019 24  22 - 30 mmol/L Final   ??? BUN, POCT 07/20/2019 55* 7 - 21 mg/dL Final   ??? Creatinine, POCT 07/20/2019 5.9* 0.7 - 1.3 mg/dL Final   ??? Glucose, POCT 07/20/2019 214* 70 - 179 mg/dL Final   ??? Calcium, POCT 07/20/2019 8.2* 8.5 - 10.2 mg/dL Final   Office Visit on 07/19/2019   Component Date Value Ref Range Status   ??? Magnesium 07/19/2019 1.9  1.6 - 2.6 mg/dL Final   ??? Sodium,  POCT 07/19/2019 140  135 - 145 mmol/L Final   ??? Potassium, POCT 07/19/2019 5.9* 3.5 - 5.0 mmol/L Final   ??? Chloride, POCT 07/19/2019 102  98 - 107 mmol/L Final   ??? CO2, POCT 07/19/2019 25  22 - 30 mmol/L Final   ??? BUN, POCT 07/19/2019 54* 7 - 21 mg/dL Final   ??? Creatinine, POCT 07/19/2019 5.8* 0.7 - 1.3 mg/dL Final   ??? Glucose, POCT 07/19/2019 250* 70 - 179 mg/dL Final   ??? Calcium, POCT 07/19/2019 8.3* 8.5 - 10.2 mg/dL Final   Office Visit on 07/14/2019   Component Date Value Ref Range Status   ??? Magnesium 07/14/2019 1.9  1.6 - 2.6 mg/dL Final   ??? PRO-BNP 16/12/9602 5,060.0* 0.0 - 138.0 pg/mL Final   ??? Sodium, POCT 07/14/2019 137  135 - 145 mmol/L Final   ??? Potassium, POCT 07/14/2019 5.5* 3.5 - 5.0 mmol/L Final   ??? Chloride, POCT 07/14/2019 102  98 - 107 mmol/L Final   ??? CO2, POCT 07/14/2019 25  22 - 30 mmol/L Final   ??? BUN, POCT 07/14/2019 51* 7 - 21 mg/dL Final   ??? Creatinine, POCT 07/14/2019 5.6* 0.7 - 1.3 mg/dL Final   ??? Glucose, POCT 07/14/2019 281* 70 - 179 mg/dL Final   ??? Calcium, POCT 07/14/2019 8.5  8.5 - 10.2 mg/dL Final   There Jarrells be more visits with results that are not included.       Lab Results   Component Value Date    PRO-BNP 8,740.0 (H) 09/14/2019    PRO-BNP 7,270.0 (H) 08/18/2019    PRO-BNP 5,460.0 (H) 07/25/2019    PRO-BNP 231 (H) 06/08/2014    Creatinine, POCT 5.8 (H) 07/25/2019    Creatinine, POCT 6.2 (H) 07/22/2019    Creatinine 4.69 (H) 11/09/2019    Creatinine 4.19 (H) 09/14/2019    BUN 25 (H) 11/09/2019    BUN 32 (H) 09/14/2019    BUN, POCT 56 (H) 07/25/2019    BUN, POCT 57 (H) 07/22/2019    Sodium 138 11/09/2019    Sodium Whole Blood 140 11/09/2019    Sodium Whole Blood 132 (L) 03/25/2017    Sodium, POCT 134 (L) 07/25/2019    Potassium 4.2 11/09/2019    Potassium, Bld 4.4 11/09/2019    Potassium, Bld 5.5 (H) 03/25/2017    Potassium, POCT 5.3 (H) 07/25/2019    CO2 22.3 11/09/2019    CO2, POCT 19 (L) 07/25/2019    Magnesium 1.8 08/20/2019    Magnesium 2.0 06/09/2014    Total Bilirubin 0.4 11/09/2019    Total Bilirubin 0.8 06/08/2014    INR, POC 1.2 02/12/2018    INR 1.01 11/09/2019       No results found for: DIGOXIN    Lab Results   Component Value Date    TSH 7.992 (H) 06/27/2019    TSH 2.29 06/08/2014    Cholesterol 213 (H) 04/14/2019    Cholesterol, Total 379 (H) 08/30/2012    Triglycerides 185 (H) 04/14/2019    Triglycerides 400 (H) 08/30/2012    HDL 39 (L) 04/14/2019    HDL 35 (L) 08/30/2012    Non-HDL Cholesterol 174 04/14/2019    LDL Calculated 137 (H) 04/14/2019    LDL Direct 239 08/30/2012    LDL Cholesterol, Calculated 264 08/30/2012       Lab Results   Component Value Date    WBC 9.4 11/09/2019    WBC 7.5 06/09/2014    HGB 12.5 (L) 11/09/2019  Hemoglobin 8.1 (L) 03/25/2017    HCT 38.8 11/09/2019    HCT 37.9 (L) 06/09/2014    Platelet 208 11/09/2019    Platelet 190 06/09/2014       Pertinent Test Results from Today:  ECG: reviewed earlier EKG showing SR.  See official ECG report.  The following are further history from the patient's EPIC record for reference:     Past Medical History:   Diagnosis Date   ??? Angina at rest (CMS-[name])    ??? Arthritis    ??? Asthma    ??? Atrial fibrillation and flutter (CMS-[name]) 06/08/14   ??? BMI 40.0-44.9, adult (CMS-[name]) 10/29/2015   ??? Chronic anticoagulation 02/17/2018   ??? Chronic pain syndrome 04/11/2014    ~Use daily lyrica as recommended ~Tramadol as needed for pain ~Fluoxetine daily as recommended ~Daily exercise ~Eat a healthy diet ~Good control of your blood sugars can help    ??? Coronary artery disease    ??? Depression    ??? Diabetes mellitus (CMS-[name])    ??? ESRD (end stage renal disease) on dialysis (CMS-[name])    ??? Eye trauma 1998    glass in both eyes and removed   ??? GERD (gastroesophageal reflux disease)    ??? Gout    ??? Homeless    ??? Hyperlipidemia    ??? Hypertension    ??? Illiteracy and low-level literacy    ??? Microscopic hematuria    ??? Migraine    ??? Nephrolithiasis    ??? 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 Nonalcoholic fatty liver disease    ??? NSTEMI (non-ST elevated myocardial infarction) (CMS-[name]) 01/13/2017   ??? Obesity    ??? Obstructive sleep apnea 2009   ??? Panic attacks    ??? 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 Seizure-like activity (CMS-[name])     ~Monitor for symptoms and report them to your primary care provider if they occur    ??? Systolic CHF, chronic (CMS-[name])     EF 40-45% 2017   ??? TIA (transient ischemic attack)        Past Surgical History:   Procedure Laterality Date   ??? CARDIAC CATHETERIZATION  03/08/2007   ??? CORONARY STENT PLACEMENT     ??? CYSTOURETHROSCOPY  12/31/2009     Microscopic hematuria   ??? KNEE SURGERY Right 09/23/2006    arthroscopic knee surgery medial and lateral meniscus tears as well as crystal disease   ??? PR CATH PLACE/CORON ANGIO, IMG SUPER/INTERP,R&L HRT CATH, L HRT VENTRIC N/A 02/13/2017    Procedure: Left/Right Heart Catheterization;  Surgeon: Marlaine Hind, MD;  Location: [facility] CATH;  Service: Cardiology   ??? PR CATH PLACE/CORON ANGIO, IMG SUPER/INTERP,W LEFT HEART VENTRICULOGRAPHY N/A 03/17/2017    Procedure: Left Heart Catheterization W Intervention;  Surgeon: Marlaine Hind, MD;  Location: [facility] CATH;  Service: Cardiology   ??? PR CATH PLACE/CORON ANGIO, IMG SUPER/INTERP,W LEFT HEART VENTRICULOGRAPHY N/A 05/12/2018    Procedure: CATH LEFT HEART CATHETERIZATION W INTERVENTION;  Surgeon: Dorathy Kinsman, MD;  Location: [facility] CATH;  Service: Cardiology   ??? PR CATH  PLACE/CORON ANGIO, IMG SUPER/INTERP,W LEFT HEART VENTRICULOGRAPHY N/A 10/22/2018    Procedure: CATH LEFT HEART CATHETERIZATION W INTERVENTION;  Surgeon: Marlaine Hind, MD;  Location: [facility] CATH;  Service: Cardiology   ??? PR CATH PLACE/CORON ANGIO, IMG SUPER/INTERP,W LEFT HEART VENTRICULOGRAPHY N/A 08/19/2019    Procedure: Left Heart Catheterization W Intervention;  Surgeon: Marlaine Hind, MD;  Location: [facility] CATH;  Service: Cardiology   ??? PR ECMO/ECLS INITIATION VENO-ARTERIAL N/A 03/19/2017    Procedure: ECMO/ECLS; INITIATION, VENO-ARTERIAL;  Surgeon: Lennie Odor, MD;  Location: MAIN OR [facility];  Service: Cardiac Surgery   ??? PR ECMO/ECLS RMVL PRPH CANNULA OPEN 6 YRS & OLDER Left 03/25/2017    Procedure: ECMO / ECLS PROVIDED BY PHYSICIAN; REMOVAL OF PERIPHERAL (ARTERIAL AND/OR VENOUS) CANNULA(E), OPEN, 6 YEARS AND OLDER;  Surgeon: Alonna Buckler Ikonomidis, MD;  Location: MAIN OR [facility];  Service: Cardiac Surgery   ??? PR EPHYS EVAL W/ ABLATION SUPRAVENT ARRHYTHMIA N/A 07/19/2015    Catheter ablation of cavotricuspid isthmus for atrial flutter [facility])   ??? PR INSER HART PACER XVENOUS ATRIAL N/A 05/30/2015    [employer] dual-chamber pacemaker implant (E.Chung)   ??? PR INSERT/PLACE FLOW DIRECT CATH N/A 03/17/2017    Procedure: Insert Leave In [place];  Surgeon: Marlaine Hind, MD; Location: [facility] CATH;  Service: Cardiology   ??? PR INSJ/RPLCMT PERM DFB W/TRNSVNS LDS 1/DUAL CHMBR N/A 05/04/2017    Procedure: ICD Implant System (Single/Dual);  Surgeon: Eldred Manges, MD;  Location: [facility] CATH;  Service: Cardiology   ??? PR NEGATIVE PRESSURE WOUND THERAPY DME >50 SQ CM Left 03/25/2017    Procedure: Neg Press Wound Tx (Vac Assist) Incl Topicals, Per Session, Tsa Greater Than/= 50 Cm Squared;  Surgeon: Alonna Buckler Ikonomidis, MD;  Location: MAIN OR [facility];  Service: Cardiac Surgery   ??? PR PRQ TRLUML CORONARY STENT W/ANGIO ONE ART/BRNCH N/A 03/12/2018    Procedure: Percutaneous Coronary Intervention;  Surgeon: Marlaine Hind, MD;  Location: [facility] CATH;  Service: Cardiology   ??? PR REBL VES DIRECT,LOW EXTREM Left 03/19/2017    Procedure: Repr Bld Vessel Direct; Lower Extrem;  Surgeon: Boykin Reaper, MD;  Location: MAIN OR [facility];  Service: Vascular   ??? PR REMV ART CLOT ILIAC-POP,LEG INCIS Left 03/25/2017    Procedure: EMBOLECTOMY OR THROMBECTOMY, WITH OR WITHOUT CATHETER; FEMOROPOPLITEAL, AORTOILIAC ARTERY, BY LEG INCISION;  Surgeon: Alonna Buckler Ikonomidis, MD;  Location: MAIN OR [facility];  Service: Cardiac Surgery   ??? PR UPPER GI ENDOSCOPY,BIOPSY N/A 02/28/2016    Procedure: UGI ENDOSCOPY; WITH BIOPSY, SINGLE OR MULTIPLE;  Surgeon: Liane Comber, MD;  [facility]: [facility];  Service: Gastroenterology   ??? PR UPPER GI ENDOSCOPY,DIAGNOSIS N/A 05/07/2016    Procedure: UGI ENDO, INCLUDE ESOPHAGUS, STOMACH, & DUODENUM &/OR JEJUNUM; DX W/WO COLLECTION SPECIMN, BY BRUSH OR WASH;  Surgeon: Modena Nunnery, MD;  [facility]: [facility];  Service: Gastroenterology 02/28/2016    Procedure: UGI ENDOSCOPY; WITH BIOPSY, SINGLE OR MULTIPLE;  Surgeon: Liane Comber, MD;  Location: [facility];  Service: Gastroenterology   ??? PR UPPER GI ENDOSCOPY,DIAGNOSIS N/A 05/07/2016    Procedure: UGI ENDO, INCLUDE ESOPHAGUS, STOMACH, & DUODENUM &/OR JEJUNUM; DX W/WO COLLECTION SPECIMN, BY BRUSH OR WASH;  Surgeon: Modena Nunnery, MD;  Location: [facility];  Service: Gastroenterology

## 2019-11-09 NOTE — Unmapped (Signed)
[facility] Emergency Department Provider Note    Patient Identification  Jeremy Oliver  Patient information was obtained from patient.  History/Exam limitations: none.    ED Clinical Impression     Final diagnoses:   Atrial tachycardia (CMS-[name]) (Primary)   Type 2 diabetes mellitus with stage 4 chronic kidney disease, with long-term current use of insulin (CMS-[name])   Coronary artery disease of native artery of native heart with stable angina pectoris (CMS-[name])     Initial Impression, ED Course, Assessment and Plan     Impression: 49 year old male with significant complicated past medical history no below who is presenting for evaluation of chest pain and general feeling of unwell.  Patient was immediately evaluated at bedside as he had a syncopal event when arriving here.  Quickly regained consciousness of seconds.  Was noted to be in A. fib RVR with rates in the 150s 160s, with a course while was in the room he spontaneously converted and appears to be in sinus rhythm now.  EKG also is consistent with that.  Heart and lung exam unremarkable this time, patient is morbidly obese on exam.  No abdominal tenderness.  Initial blood pressure is reassuring.  Was given full dose aspirin, has a central line in right chest, dressing appears to be clean and dry.  No new EKG changes.  This was at dialysis today.  We will continue to closely monitor, troponins, labs, chest x-ray, will also obtain a crit care blood gas predominately to look at the potassium.    7:00 AM  Signout given to oncoming provider who will assume care.  Disposition pending per further work-up.  At this time patient's blood pressure remained stable and he is alert and awake.    Critical Care  Performed by: Harriet Pho, MD  Authorized by: Harriet Pho, MD     Critical care provider statement:     Critical care time (minutes):  35    Critical care time was exclusive of:  Separately billable procedures and treating other patients    Critical care was necessary to treat or prevent imminent or life-threatening deterioration of the following conditions:  Circulatory failure    Critical care was time spent personally by me on the following activities:  Blood draw for specimens, ordering and performing treatments and interventions, ordering and review of laboratory studies, development of treatment plan with patient or surrogate, ordering and review of radiographic studies, pulse oximetry, evaluation of patient's response to treatment, re-evaluation of patient's condition, examination of patient and review of old charts          Additional Medical Decision Making     I independently visualized the EKG tracing.   I independently visualized the radiology images.   I reviewed the patient's prior medical records.     Any labs and radiology results that were available during my care of the patient were independently reviewed by me and considered in my medical decision making.    Portions of this record have been created using Scientist, clinical (histocompatibility and immunogenetics). Dictation errors have been sought, but Mckelvin not have been identified and corrected.  ____________________________________________    I have reviewed the triage vital signs and the nursing notes.      Patient History     Chief Complaint  Chest Pain      HPI   Jeremy Oliver is a 49 y.o. male possible history of asthma, end-stage renal disease on dialysis, A. fib, CAD, diabetes, reflux, high cholesterol,  hypertension, CHF with EF of around 45% who is presenting for evaluation of chest pain and general feeling of malaise.  Patient reports went to sleep last night feeling his normal self.  Woke up around 530 and was not feeling well.  Initially took his blood pressure and said that his systolic was 50 over something.  Told his wife were rechecked again and it was significantly elevated.  Given nitroglycerin at this point.  Had one episode of vomiting.  Drove here for further evaluation.  Still endorsing general malaise, feeling of unwell, chest pressure.  He denies any recent fevers, chills.  No cough or congestion.  Denies any vomiting blood or blood in his stool.  No abdominal pain.  Denies any increased leg swelling.    Past Medical History:   Diagnosis Date   ??? Angina at rest (CMS-[name])    ??? Arthritis    ??? Asthma    ??? Atrial fibrillation and flutter (CMS-[name]) 06/08/14   ??? BMI 40.0-44.9, adult (CMS-[name]) 10/29/2015   ??? Chronic anticoagulation 02/17/2018   ??? Chronic pain syndrome 04/11/2014    ~Use daily lyrica as recommended ~Tramadol as needed for pain ~Fluoxetine daily as recommended ~Daily exercise ~Eat a healthy diet ~Good control of your blood sugars can help    ??? Coronary artery disease    ??? Depression    ??? Diabetes mellitus (CMS-[name])    ??? ESRD (end stage renal disease) on dialysis (CMS-[name])    ??? Eye trauma 1998    glass in both eyes and removed   ??? GERD (gastroesophageal reflux disease)    ??? Gout    ??? Homeless    ??? Hyperlipidemia    ??? Hypertension    ??? Illiteracy and low-level literacy    ??? Microscopic hematuria    ??? Migraine    ??? Nephrolithiasis    ??? 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 Nonalcoholic fatty liver disease    ??? NSTEMI (non-ST elevated myocardial infarction) (CMS-[name]) 01/13/2017   ??? Obesity    ??? Obstructive sleep apnea 2009   ??? Panic attacks    ??? 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 Seizure-like activity (CMS-[name])     ~Monitor for symptoms and report them to your primary care provider if they occur    ??? Systolic CHF, chronic (CMS-[name])     EF 40-45% 2017   ??? TIA (transient ischemic attack)        Patient Active Problem List   Diagnosis   ??? 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 Idiopathic chronic gout of left knee   ??? Hypercholesteremia   ??? Diabetes mellitus with coincident hypertension (CMS-[name])   ??? 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 Osteoarthritis   ??? Chronic nonalcoholic liver disease   ??? GERD (gastroesophageal reflux disease)   ??? Mild intermittent asthma without complication   ??? Chronic pain syndrome   ??? Paroxysmal atrial fibrillation (CMS-[name])   ???  OSA (obstructive sleep apnea)   ??? Tobacco use disorder   ??? Status post cardiac pacemaker procedure   ??? Diabetes mellitus with gastroparesis (CMS-[name])   ??? Cardiac pacemaker in situ   ??? Recurrent major depressive disorder, in full remission (CMS-[name])   ??? Adjustment disorder with mixed anxiety and depressed mood   ??? Hyperkalemia   ??? Acquired left foot drop   ??? Decreased sensation of lower extremity, left   ??? Chronic anticoagulation   ??? Type 2 diabetes mellitus with stage 4 chronic kidney disease, with long-term current use of insulin (CMS-[name])   ??? Situational stress   ??? Cough   ??? QT prolongation   ??? ESRD needing dialysis (CMS-[name])   ??? Palliative care by specialist   ??? Angina pectoris (CMS-[name])   ??? Psychophysiological insomnia       Past Surgical History:   Procedure Laterality Date   ??? CARDIAC CATHETERIZATION  03/08/2007   ??? CORONARY STENT PLACEMENT     ??? CYSTOURETHROSCOPY  12/31/2009     Microscopic hematuria   ??? KNEE SURGERY Right 09/23/2006    arthroscopic knee surgery medial and lateral meniscus tears as well as crystal disease   ??? PR CATH PLACE/CORON ANGIO, IMG SUPER/INTERP,R&L HRT CATH, L HRT VENTRIC N/A 02/13/2017    Procedure: Left/Right Heart Catheterization;  Surgeon: Marlaine Hind, MD;  Location[facility] CATH;  Service: Cardiology   ??? PR CATH PLACE/CORON ANGIO, IMG SUPER/INTERP,W LEFT HEART VENTRICULOGRAPHY N/A 03/17/2017    Procedure: Left Heart Catheterization W Intervention;  Surgeon: Marlaine Hind, MD;  Location: [facility] CATH;  Service: Cardiology   ??? PR CATH PLACE/CORON ANGIO, IMG SUPER/INTERP,W LEFT HEART VENTRICULOGRAPHY N/A 05/12/2018    Procedure: CATH LEFT HEART CATHETERIZATION W INTERVENTION;  Surgeon: Dorathy Kinsman, MD;  Location: [facility] CATH;  Service: Cardiology   ??? PR CATH PLACE/CORON ANGIO, IMG SUPER/INTERP,W LEFT HEART VENTRICULOGRAPHY N/A 10/22/2018    Procedure: CATH LEFT HEART CATHETERIZATION W INTERVENTION;  Surgeon: Marlaine Hind, MD;  Location: [facility] CATH;  Service: Cardiology   ??? PR CATH PLACE/CORON ANGIO, IMG SUPER/INTERP,W LEFT HEART VENTRICULOGRAPHY N/A 08/19/2019    Procedure: Left Heart Catheterization W Intervention;  Surgeon: Marlaine Hind, MD;  Location: [facility] CATH;  Service: Cardiology   ??? PR ECMO/ECLS INITIATION VENO-ARTERIAL N/A 03/19/2017    Procedure: ECMO/ECLS; INITIATION, VENO-ARTERIAL;  Surgeon: Lennie Odor, MD;  Location: MAIN OR [facility];  Service: Cardiac Surgery   ??? PR ECMO/ECLS RMVL PRPH CANNULA OPEN 6 YRS & OLDER Left 03/25/2017    Procedure: ECMO / ECLS PROVIDED BY PHYSICIAN; REMOVAL OF PERIPHERAL (ARTERIAL AND/OR VENOUS) CANNULA(E), OPEN, 6 YEARS AND OLDER;  Surgeon: Alonna Buckler Ikonomidis, MD;  Location: MAIN OR [facility];  Service: Cardiac Surgery   ??? PR EPHYS EVAL W/ ABLATION SUPRAVENT ARRHYTHMIA N/A 07/19/2015    Catheter ablation of cavotricuspid isthmus for atrial flutter [facility])   ??? PR INSER HART PACER XVENOUS ATRIAL N/A 05/30/2015    [employer] dual-chamber pacemaker implant (E.Chung)   ??? PR INSERT/PLACE FLOW DIRECT CATH N/A 03/17/2017    Procedure: Insert Leave In [place];  Surgeon: Marlaine Hind, MD;  Location: [facility] CATH;  Service: Cardiology   ??? PR INSJ/RPLCMT PERM DFB W/TRNSVNS LDS 1/DUAL CHMBR N/A 05/04/2017    Procedure: ICD Implant System (Single/Dual);  Surgeon: Eldred Manges, MD;  Location: [facility] CATH; Service: Cardiology   ??? PR NEGATIVE PRESSURE WOUND THERAPY DME >50 SQ CM Left 03/25/2017    Procedure: Neg Press Wound Tx (Vac Assist) Incl  Topicals, Per Session, Tsa Greater Than/= 50 Cm Squared;  Surgeon: Alonna Buckler Ikonomidis, MD;  Location: MAIN OR [facility];  Service: Cardiac Surgery   ??? PR PRQ TRLUML CORONARY STENT W/ANGIO ONE ART/BRNCH N/A 03/12/2018    Procedure: Percutaneous Coronary Intervention;  Surgeon: Marlaine Hind, MD;  Location: [facility] CATH;  Service: Cardiology   ??? PR REBL VES DIRECT,LOW EXTREM Left 03/19/2017    Procedure: Repr Bld Vessel Direct; Lower Extrem;  Surgeon: Boykin Reaper, MD;  Location: MAIN OR [facility];  Service: Vascular   ??? PR REMV ART CLOT ILIAC-POP,LEG INCIS Left 03/25/2017    Procedure: EMBOLECTOMY OR THROMBECTOMY, WITH OR WITHOUT CATHETER; FEMOROPOPLITEAL, AORTOILIAC ARTERY, BY LEG INCISION;  Surgeon: Alonna Buckler Ikonomidis, MD;  Location: MAIN OR [facility];  Service: Cardiac Surgery   ??? PR UPPER GI ENDOSCOPY,BIOPSY N/A 02/28/2016    Procedure: UGI ENDOSCOPY; WITH BIOPSY, SINGLE OR MULTIPLE;  Surgeon: Liane Comber, MD;  [facility]: [facility];  Service: Gastroenterology   ??? PR UPPER GI ENDOSCOPY,DIAGNOSIS N/A 05/07/2016    Procedure: UGI ENDO, INCLUDE ESOPHAGUS, STOMACH, & DUODENUM &/OR JEJUNUM; DX W/WO COLLECTION SPECIMN, BY BRUSH OR WASH;  Surgeon: Modena Nunnery, MD;  Location: [facility];  Service: Gastroenterology       No current facility-administered medications for this encounter.    Current Outpatient Medications:   ???  acetaminophen (TYLENOL) 500 MG tablet, TAKE 2 TABLETS (1 000MG ) BY MOUTH EVERY 8 HOURS AS NEEDED FOR PAIN, Disp: 180 tablet, Rfl: 0  ???  albuterol HFA 90 mcg/actuation inhaler, Inhale 2 puffs into the lungs every 6 (six) hours as needed for wheezing or shortness of breath., Disp: 18 g, Rfl: 2  ???  alirocumab (PRALUENT) 150 mg/mL subcutaneous injection, Inject the contents of 1 pen (150 mg total) under the skin every fourteen (14) days., Disp: 6 mL, Rfl: 3  ???  allopurinoL (ZYLOPRIM) 100 MG tablet, Take 2 tablets (200 mg total) by mouth daily., Disp: 60 tablet, Rfl: 5  ???  amiodarone (PACERONE) 200 MG tablet, TAKE 1 TABLET BY MOUTH ONCE DAILY, Disp: 90 tablet, Rfl: 3  ???  amLODIPine (NORVASC) 10 MG tablet, Take 10 mg by mouth daily., Disp: , Rfl:   ???  apixaban (ELIQUIS) 5 mg Tab, Take 1 tablet (5 mg total) by mouth Two (2) times a day., Disp: 60 tablet, Rfl: 11  ???  atorvastatin (LIPITOR) 80 MG tablet, Take 1 tablet (80 mg total) by mouth daily., Disp: 90 tablet, Rfl: 3  ???  blood sugar diagnostic Strp, Use as directed to check blood glucose Four (4) times a day (before meals and nightly)., Disp: 300 each, Rfl: 3  ???  blood-glucose meter kit, Disp. blood glucose meter kit preferred by patient's insurance. Dx: Diabetes, E11.9, Disp: 1 each, Rfl: 11  ???  bumetanide (BUMEX) 2 MG tablet, Take 1 tablet (2 mg total) by mouth two (2) times a day. On non dialysis days, Disp: 180 tablet, Rfl: 3  ???  cholecalciferol, vitamin D3 25 mcg, 1,000 units,, 1,000 unit (25 mcg) tablet, Take 1 tablet (25 mcg total) by mouth daily., Disp: 100 tablet, Rfl: 11  ???  diclofenac sodium (VOLTAREN) 1 % gel, Apply 2 grams topically Three (3) times a day., Disp: 180 g, Rfl: 11  ???  empty container (SHARPS-A-GATOR DISPOSAL SYSTEM) Misc, Use as directed for sharps disposal, Disp: 1 each, Rfl: 2  ???  ezetimibe (ZETIA) 10 mg tablet, TAKE 1 TABLET BY MOUTH ONCE DAILY, Disp: 90 each,  Rfl: 3  ???  FLUoxetine (PROZAC) 40 MG capsule, Take 2 capsules (80 mg total) by mouth daily. (Patient taking differently: Take 80 mg by mouth nightly. ), Disp: 180 capsule, Rfl: 3  ???  gabapentin (NEURONTIN) 300 MG capsule, Take 1 capsule (300 mg total) by mouth two (2) times a day., Disp: 180 capsule, Rfl: 3  ???  hydrOXYzine (ATARAX) 10 MG tablet, Take 1 tablet (10 mg total) by mouth nightly as needed for anxiety., Disp: 30 tablet, Rfl: 6  ???  insulin ASPART (NOVOLOG FLEXPEN U-100 INSULIN) 100 unit/mL (3 mL) injection pen, Inject 15 Units under the skin Three (3) times a day before meals., Disp: 45 mL, Rfl: 3  ???  insulin detemir U-100 (LEVEMIR FLEXTOUCH U-100 INSULN) 100 unit/mL (3 mL) injection pen, Inject 37 units total under the skin nightly, Disp: 15 mL, Rfl: 3  ???  insulin syringe-needle U-100 0.3 mL 31 gauge x 5/16 Syrg, Use to inject insulin twice daily with meals, Disp: 60 each, Rfl: 0  ???  isosorbide mononitrate (IMDUR) 30 MG 24 hr tablet, Take 1 tablet (30 mg total) by mouth daily. DO NOT TAKE on dialysis days, Disp: 120 tablet, Rfl: 3  ???  lancets Misc, Use as directed to check blood glucose Four (4) times a day (before meals and nightly)., Disp: 300 each, Rfl: 3  ???  loperamide (IMODIUM) 2 mg capsule, Take 1 capsule twice a day and another capsule after each episode of diarrhea, up to 8 pills per day., Disp: 240 capsule, Rfl: 2  ???  metoprolol succinate (TOPROL XL) 100 MG 24 hr tablet, Take 1 tablet (100 mg total) by mouth daily., Disp: 30 tablet, Rfl: 5  ???  miscellaneous medical supply (BLOOD PRESSURE CUFF) Misc, Measure BP once a day or if any symptoms, Disp: 1 each, Rfl: 0  ???  nitroglycerin (NITROSTAT) 0.4 MG SL tablet, Place 1 tablet (0.4 mg total) under the tongue every five (5) minutes as needed for chest pain., Disp: 100 each, Rfl: 3  ???  omeprazole (PRILOSEC) 40 MG capsule, Take 1 capsule (40 mg total) by mouth Two (2) times a day (30 minutes before a meal)., Disp: 180 capsule, Rfl: 3  ???  ondansetron (ZOFRAN) 4 MG tablet, Take 1 tablet (4 mg total) by mouth every twelve (12) hours as needed., Disp: 15 tablet, Rfl: 0  ???  semaglutide (OZEMPIC) 0.25 mg or 0.5 mg(2 mg/1.5 mL) PnIj injection, Inject 0.5 mg under the skin every seven (7) days., Disp: 4.5 mL, Rfl: 3  ???  sevelamer (RENVELA) 800 mg tablet, TAKE 2 TABLETS (1600MG ) BY MOUTH THREE TIMES DAILY WITH MEALS, Disp: 540 tablet, Rfl: 3  ???  ticagrelor (BRILINTA) 90 mg Tab, TAKE 1 TABLET BY MOUTH TWICE DAILY, Disp: 180 tablet, Rfl: 3  ???  UNIFINE PENTIPS 31 gauge x 5/16 (8 mm) Ndle, USE WITH INSULIN AND VICTOZA 5 TIMES DAILY, Disp: 400 each, Rfl: 3    Allergies  Losartan and Opioids - morphine analogues    Family History   Problem Relation Age of Onset   ??? Diabetes Mother    ??? Hyperlipidemia Mother    ??? Heart disease Mother    ??? Basal cell carcinoma Mother    ??? Blindness Mother         diabeties   ??? Obesity Mother    ??? Hyperlipidemia Father    ??? Heart disease Father    ??? Lung disease Father    ??? Heart disease Half-Sister    ???  Kidney disease Half-Sister    ??? Gallbladder disease Half-Brother    ??? Obesity Half-Brother    ??? No Known Problems Daughter    ??? No Known Problems Son    ??? Obesity Daughter    ??? Depression Maternal Uncle         Died by suicide in May 21, 2017 by gunshot   ??? Melanoma Neg Hx    ??? Squamous cell carcinoma Neg Hx    ??? Macular degeneration Neg Hx    ??? Glaucoma Neg Hx        Social History  Social History     Tobacco Use   ??? Smoking status: Never Smoker   ??? Smokeless tobacco: Current User     Types: Chew   ??? Tobacco comment: 08/15/19:  Dips 1x a day./ /Pt in process of reducing tobacco use,  dips 1 can every 3 weeks,   Vaping Use   ??? Vaping Use: Never used   Substance Use Topics   ??? Alcohol use: Never     Alcohol/week: 0.0 standard drinks     Comment: rare use: couple times a year, small amount   ??? Drug use: No       Review of Systems  General: no fevers, chills  Cardiac: no CP, SOB, palpitations  Pulmonary: no cough, sputum production, hemoptysis  A 10 point review of systems was negative except for those previously mentioned in the HPI and above.    Physical Exam     ED Triage Vitals [11/09/19 0637]   Enc Vitals Group      BP       Heart Rate 117      SpO2 Pulse       Resp 22      Temp       Temp src       SpO2       Weight       Height       Head Circumference       Peak Flow       Pain Score       Pain Loc       Pain Edu?       Excl. in GC?        Constitutional: alert and cooperative, obese, chronically ill  ENT:       Head: [place]/AT Eyes: PERRL, conjunctiva clear, sclera anicteric       Nose: no congestion       Mouth/Throat: MM moist       Neck: supple, midline trachea, no tenderness or cervical adenopathy  Cardiac: RRR, normal S1, S2, no appreciable murmurs or rubs. Distal pulses present and symmetrical B/L  Respiratory: CTA bilaterally, non-labored breathing, no stridor, wheezing or crackles  Abdomen: soft, NT, normal BS. No masses, rebound or guarding  Extremities: LE normal with no CCE, symmetric in size and NTTP  Skin: warm and dry, no rashes or lesions  Neurologic: no gross focal neurologic deficits appreciated  Psychiatric: normal mood, affect, speech and behavior    EKG       Radiology       I independently visualized these images.     Harriet Pho, MD  11/14/19 1958

## 2019-11-09 NOTE — Unmapped (Unsigned)
[facility]  Cardiac Device Clinic-    Tel: [phone]  Fax: [phone]    Cardiac Implanted Electronic Device Evaluation-In Person Outpatient Visit    Visit Date:  11/09/2019    Manufacturer of Device:  [employer]: VIGILANT EL ICD D233/ [phone]  Type of Device: Dual chamber ICD  Indication for Implant Tach Cardiac Arrest, Atrial fib  Battery: 11 estimated longevity    Mode: DDD  Lower rate limit:  70  bpm  Upper Tracking Limit:   130  bpm  Tachy Settings:      Presenting rhythm:  AsVs  Underlying rhythm: 80 bpm  Dependent: No    Pacing Percentages  AP: 4  RVP: <1      Impedence (ohms)  Atrial lead:620  Right Ventricular lead: 294    Sensing (mV)  Atrial lead: 6.3    mV  Right Ventricular lead: 20   mV     Capture threshold (V @ ms)  Atrial lead:  0.8 V @   0.4  ms  Right Ventricular lead: 0.8  V @ 0.4  ms    Diagnostics/Episodes add on after ER visit today for Afib. Device was interrogated in ER strips were down loaded  ?? Multiple ATR episodes today with RVR falling into the VT-1 zone for short periods    Fluid Monitoring  ?? NA       Reprogramming  ?? None. Deetta Perla reviewing medications    Follow-up Plan: Continue remote monitoring.       Please see scanned and / or downloaded PDF file in this patient's chart for this encounter for full details of device interrogation and reprogramming.

## 2019-11-09 NOTE — Unmapped (Signed)
Your device function is normal today.      Home monitoring results are now available in your mychart.    Appointment Reminders-PLEASE IGNORE THE ARRIVE BY TIME.  These appointments occur from HOME        For general questions or concerns, please call the DEVICE CLINIC during normal business hours. If you are having a medical emergency, go to the nearest emergency room or call 911.  For ICDs only  If you receive ONE shock If you receive ONE shock If you receive TWO or MORE shocks   You have no symptoms You have symptoms:    Chest pain/pressure  Shortness of breath  Rapid heart action  Dizziness  Generally don't feel well    Call your heart doctor to discuss the shock and arrange for appropriate follow-up Call 911. Follow-up with a call to your doctor. Call 911 immediately. Follow-up with a call to your heart doctor.           For Device Related Questions please contact the Device Clinic [phone] (Monday-Friday 8:00-4:30p.m)    To schedule or change an appointment with Device Clinic call: [phone]    For technical assistance with home monitors:  Please call your home monitoring company    [employer] [phone]    Future Appointments   Date Time Provider Department Center   11/21/2019 10:30 AM Gwenlyn Perking Tensed, DO [facility]   12/14/2019  4:00 PM Nilsa Nutting, MD [facility]   12/15/2019  2:15 PM Marlaine Hind, MD [facility]   01/16/2020  9:20 AM Monica Martinez, MD [facility]   01/31/2020 10:30 AM Petal EP REMOTE MONITORING [facility]

## 2019-11-10 MED ORDER — METOPROLOL SUCCINATE ER 100 MG TABLET,EXTENDED RELEASE 24 HR
ORAL_TABLET | Freq: Every day | ORAL | 5 refills | 30.00000 days | Status: CP
Start: 2019-11-10 — End: 2019-12-10
  Filled 2019-11-12: qty 30, 30d supply, fill #0

## 2019-11-12 MED FILL — METOPROLOL SUCCINATE ER 100 MG TABLET,EXTENDED RELEASE 24 HR: 30 days supply | Qty: 30 | Fill #0 | Status: AC

## 2019-11-12 NOTE — Unmapped (Signed)
This patient has been reviewed for Complex Case Management services and is not eligible at this time. To have this patient reevaluated for Complex Case Management please place AMB Referral to Case Management to the Personal Health Advocate Department. Alternative management of care needs: Enrolled in CCM program     Reynaldo Minium RN, CCM - Intensive Case Manager  [facility] Clinical Services  [address] [place], [place] [address]  p. [phone]  [email or web address]

## 2019-11-21 ENCOUNTER — Ambulatory Visit: Admit: 2019-11-21 | Discharge: 2019-11-21 | Payer: MEDICARE

## 2019-11-21 ENCOUNTER — Ambulatory Visit
Admit: 2019-11-21 | Discharge: 2019-11-21 | Payer: MEDICARE | Attending: Physical Medicine & Rehabilitation | Primary: Physical Medicine & Rehabilitation

## 2019-11-21 DIAGNOSIS — I5042 Chronic combined systolic (congestive) and diastolic (congestive) heart failure: Principal | ICD-10-CM

## 2019-11-21 DIAGNOSIS — Z992 Dependence on renal dialysis: Principal | ICD-10-CM

## 2019-11-21 DIAGNOSIS — M21372 Foot drop, left foot: Principal | ICD-10-CM

## 2019-11-21 DIAGNOSIS — N186 End stage renal disease: Principal | ICD-10-CM

## 2019-11-21 NOTE — Unmapped (Signed)
[facility] Physical Medicine and Rehabilitation   New Patient Note      Patient Name:Jeremy Oliver  MRN: [id number]  DOB: 1970/04/14  Age: 49 y.o.   Encounter Date: 11/21/2019     ASSESSMENT:     Jeremy Oliver is a 49 y.o. 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      PLAN:     Evaluation shows that the patient has a mobility limitation that significantly impairs the ability to participate in one or more Mobility-Related Activities of Daily Living (MRADLs) in customary locations in the home. This mobility limitation cannot be sufficiently and safely resolved by using an appropriately fitted cane or walker; and the patient does not have sufficient upper extremity function to self-propel an optimally configured manual wheelchair in the home to perform MRADLs during a typical day.     Most appropriate device is:     Museum/gallery exhibitions officer. He meets all general coverage criteria  for PMDs PLUS:  ?? He does not  meet the coverage criteria for a POV;  ?? He  has the mental and physical capabilities to safely operate the PWC (or has a caregiver available, willing, and able to safely operate the [facility]) but is unable to adequately propel an optimally configured   ?? manual wheelchair  ?? His weight is less than, or equal to, the weight capacity of the PWC and greater than, or equal to, 95 percent of the weight capacity of the next lower weight class of PWC;???   ?? The patient???s home provides adequate access between rooms, maneuvering space, and surfaces for the operation of the PWC;  ?? Using a PWC will significantly improve the patient???s ability to participate in MRADLs and the patient will use the PWC in the home???   ?? The patient has not expressed an unwillingness to use a PWC      Power Operated Vehicle (POV or Psychiatric nurse). The patient meets all criteria for Power Mobility Device PLUS   ?? Is able to Safely transfer to and from a POV; Operate the tiller steering system; and Maintain postural stability and position while operating the POV in the home.   ?? Has mental capabilities and physical capabilities sufficient for safe mobility using a POV in the home;  ?? The patient???s weight is less than, or equal to, the weight capacity of the POV and greater than, or equal to, 95 percent of the weight capacity of the next lower weight class of POV;  ?? His home provides adequate access between rooms, maneuvering space, and surfaces for the operation of the POV;  ?? Using a POV will significantly improve his ability to participate in MRADLs and he will use the POV in the home    Follow up: Return if symptoms worsen or fail to improve.      SUBJECTIVE:     Chief Complaint:   Chief Complaint   Patient presents with   ??? new patient     left ankle, ran over by a tractor 2019 or 2000. Having issues with drop foot     History of Present Illness: Jeremy Oliver is a 49 y.o. year old male seen for evaluation for mobility assistive devices due to LLE weakness, CAD s/p multiple PCI's, Hx of VT after STEMI (  2018) s/p ICD, NSTEMI x 3 (02/2018, 05/2018, and 09/2018), paroxysmal Afib, OSA on CPAP, HFrEF (EF 35% on ECHO in 08/2019), pAF, HTN, T2DM, and ESRD on HD.     He has a history of LLE weakness dating back hospitalization to 05/2017 when he first began having LLE numbness and weakness. MRI of Thoracic, Lumbar, and Pelvis at that time showed no cord ischemia or lumbosacral plexus injury. His injury was thought to be from nerve damage during ECMO decannulation. He was later diagnosed with Acquired Left Foot Drop. He has trialed 2 different AFO's with both causing blistering of his LLE.    Concerns for today's visit: Evaluation for mobility assistive devices.    What is this patient???s mobility limitation and how does it interfere with the performance of activities of daily living? Left Lower Extremity weakness, Shortness of Breath, orthostatic hypotension with reported presyncopal and syncopal events, and generalized fatigue. Significantly limiting any mobility and difficulty when going from sitting to standing.     Why can???t a cane or walker meet this patient???s mobility needs in the home? He is significantly impaired by his worsening shortness of breath, exertional dyspnea, and generalized fatigue.    Why can???t a manual wheelchair meet this patient???s mobility needs in the home? He is significantly impaired by his worsening shortness of breath, dyspnea, and generalized fatigue that only worsen with even light exertion.    Does this patient have the physical and mental abilities to operate a PMD safely in the home? Yes.    Symptoms that limit ambulation: Left Lower Extremity weakness, shortness of breath, exertional dyspnea, generalized fatigue.     Diagnoses responsible for these symptoms: Acquired LLE weakness secondary to ECMO decannulation (2018), HFrEF, Paroxysmal Afib, CAD s/p multiple PCI's, Hx of STEMI (2018) s/p ICD, Hx of NSTEMI x 3 (02/2018, 05/2018, and 09/2018),    Medications or other treatment for these symptoms: LLE AFO x 2 (unable to tolerate), Amiodarone, Amlodipine, Bumex, Imdur, Metoprolol.    Progression of ambulation difficulty over time: Shorter duration of ambulation due to worsening dyspnea on exertion..     Other diagnoses that Pro relate to ambulatory problems: See above.    How far the patient can walk without stopping : He can only walk about 50 feet independently without needing to take a break to sit for a few minutes. He can walk a little more than 50 feet with moderate assistance.     Pace of ambulation: Slow.    What ambulatory assistance is currently used: Cane x2 - both of which broke.     What has changed to now require use of a PMD: Progression of disease in the setting of acquired Left Lower Extremity Weakness.     Ability to stand up from a seated position without assistance - Yes.      The following MRADLs are impaired:  The patient needs the PMD to safely get to the bathroom to toilet and bathe. Yes  The patient needs the PMD to safely get to the kitchen to eat. Yes  The patient needs the PMD to safely get to the bedroom to groom and dress. Yes    Description of the home setting and the ability to perform activities of daily living in the home: Older home with very narrow hallways. He has 3 steps to enter. He has the following equipment within the home: [facility] + Shower chair.      He reports a history of falls, the last of which  was 2 days ago with close calls almost daily.     Community/School/Work: Retired and on disability since 2010.     Functional History: Independent of ADL's with setup - requires the assistance of his wife for transportation throughout the house. No other home assistance.     Equipment: BSC + shower chair + cane + rollator (not using due to instability).     Therapy: None currently - previously had done home PT.     Past Medical History:   Diagnosis Date   ??? Angina at rest (CMS-[name])    ??? Arthritis    ??? Asthma    ??? Atrial fibrillation and flutter (CMS-[name]) 06/08/14   ??? BMI 40.0-44.9, adult (CMS-[name]) 10/29/2015   ??? Chronic anticoagulation 02/17/2018   ??? Chronic pain syndrome 04/11/2014    ~Use daily lyrica as recommended ~Tramadol as needed for pain ~Fluoxetine daily as recommended ~Daily exercise ~Eat a healthy diet ~Good control of your blood sugars can help    ??? Coronary artery disease    ??? Depression    ??? Diabetes mellitus (CMS-[name])    ??? ESRD (end stage renal disease) on dialysis (CMS-[name])    ??? Eye trauma 1998    glass in both eyes and removed   ??? GERD (gastroesophageal reflux disease)    ??? Gout    ??? Homeless    ??? Hyperlipidemia    ??? Hypertension    ??? Illiteracy and low-level literacy    ??? Microscopic hematuria    ??? Migraine    ??? Nephrolithiasis    ??? 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 Nonalcoholic fatty liver disease    ??? NSTEMI (non-ST elevated myocardial infarction) (CMS-[name]) 01/13/2017   ??? Obesity    ??? Obstructive sleep apnea 2009   ??? Panic attacks    ??? 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 Seizure-like activity (CMS-[name])     ~Monitor for symptoms and report them to your primary care provider if they occur    ??? Systolic CHF, chronic (CMS-[name])     EF 40-45% 2017   ??? TIA (transient ischemic attack)      Past Surgical History:   Procedure Laterality Date   ??? CARDIAC CATHETERIZATION  03/08/2007   ??? CORONARY STENT PLACEMENT     ??? CYSTOURETHROSCOPY  12/31/2009     Microscopic hematuria   ??? KNEE SURGERY Right 09/23/2006    arthroscopic knee surgery medial and lateral meniscus tears as well as crystal disease   ??? PR CATH PLACE/CORON ANGIO, IMG SUPER/INTERP,R&L HRT CATH, L HRT VENTRIC N/A 02/13/2017    Procedure: Left/Right Heart Catheterization;  Surgeon: Marlaine Hind, MD;  Location: [facility] CATH;  Service: Cardiology   ??? PR CATH PLACE/CORON ANGIO, IMG SUPER/INTERP,W LEFT HEART VENTRICULOGRAPHY N/A 03/17/2017    Procedure: Left Heart Catheterization W Intervention;  Surgeon: Marlaine Hind, MD;  Location: [facility] CATH;  Service: Cardiology   ??? PR CATH PLACE/CORON ANGIO, IMG SUPER/INTERP,W LEFT HEART VENTRICULOGRAPHY N/A 05/12/2018    Procedure: CATH LEFT HEART CATHETERIZATION W INTERVENTION;  Surgeon: Dorathy Kinsman, MD;  Location: [facility] CATH;  Service: Cardiology   ??? PR CATH PLACE/CORON ANGIO, IMG SUPER/INTERP,W LEFT HEART VENTRICULOGRAPHY N/A 10/22/2018    Procedure: CATH LEFT HEART CATHETERIZATION W INTERVENTION;  Surgeon: Marlaine Hind, MD;  Location: [facility] CATH;  Service: Cardiology   ??? PR CATH PLACE/CORON ANGIO, IMG SUPER/INTERP,W LEFT HEART VENTRICULOGRAPHY N/A 08/19/2019    Procedure: Left Heart Catheterization W Intervention;  Surgeon: Marlaine Hind, MD;  Location: [facility] CATH;  Service: Cardiology   ??? PR ECMO/ECLS INITIATION VENO-ARTERIAL N/A 03/19/2017    Procedure: ECMO/ECLS; INITIATION, VENO-ARTERIAL;  Surgeon: Lennie Odor, MD;  Location: MAIN OR [facility];  Service: Cardiac Surgery   ??? PR ECMO/ECLS RMVL PRPH CANNULA OPEN 6 YRS & OLDER Left 03/25/2017    Procedure: ECMO / ECLS PROVIDED BY PHYSICIAN; REMOVAL OF PERIPHERAL (ARTERIAL AND/OR VENOUS) CANNULA(E), OPEN, 6 YEARS AND OLDER;  Surgeon: Alonna Buckler Ikonomidis, MD;  Location: MAIN OR [facility];  Service: Cardiac Surgery   ??? PR EPHYS EVAL W/ ABLATION SUPRAVENT ARRHYTHMIA N/A 07/19/2015    Catheter ablation of cavotricuspid isthmus for atrial flutter [facility])   ??? PR INSER HART PACER XVENOUS ATRIAL N/A 05/30/2015    [employer] dual-chamber pacemaker implant (E.Chung)   ??? PR INSERT/PLACE FLOW DIRECT CATH N/A 03/17/2017    Procedure: Insert Leave In [place];  Surgeon: Marlaine Hind, MD;  Location: [facility] CATH;  Service: Cardiology   ??? PR INSJ/RPLCMT PERM DFB W/TRNSVNS LDS 1/DUAL CHMBR N/A 05/04/2017    Procedure: ICD Implant System (Single/Dual);  Surgeon: Eldred Manges, MD;  Location: [facility] CATH;  Service: Cardiology   ??? PR NEGATIVE PRESSURE WOUND THERAPY DME >50 SQ CM Left 03/25/2017    Procedure: Neg Press Wound Tx (Vac Assist) Incl Topicals, Per Session, Tsa Greater Than/= 50 Cm Squared;  Surgeon: Alonna Buckler Ikonomidis, MD;  Location: MAIN OR [facility];  Service: Cardiac Surgery   ??? PR PRQ TRLUML CORONARY STENT W/ANGIO ONE ART/BRNCH N/A 03/12/2018    Procedure: Percutaneous Coronary Intervention;  Surgeon: Marlaine Hind, MD;  Location: [facility] CATH;  Service: Cardiology   ??? PR REBL VES DIRECT,LOW EXTREM Left 03/19/2017    Procedure: Repr Bld Vessel Direct; Lower Extrem;  Surgeon: Boykin Reaper, MD;  Location: MAIN OR [facility];  Service: Vascular   ??? PR REMV ART CLOT ILIAC-POP,LEG INCIS Left 03/25/2017    Procedure: EMBOLECTOMY OR THROMBECTOMY, WITH OR WITHOUT CATHETER; FEMOROPOPLITEAL, AORTOILIAC ARTERY, BY LEG INCISION;  Surgeon: Alonna Buckler Ikonomidis, MD;  Location: MAIN OR [facility];  Service: Cardiac Surgery   ??? PR UPPER GI ENDOSCOPY,BIOPSY N/A 02/28/2016    Procedure: UGI ENDOSCOPY; WITH BIOPSY, SINGLE OR MULTIPLE;  Surgeon: Liane Comber, MD;  [facility];  Service: Gastroenterology   ??? PR UPPER GI ENDOSCOPY,DIAGNOSIS N/A 05/07/2016    Procedure: UGI ENDO, INCLUDE ESOPHAGUS, STOMACH, & DUODENUM &/OR JEJUNUM; DX W/WO COLLECTION SPECIMN, BY BRUSH OR WASH;  Surgeon: Modena Nunnery, MD;  [facility]: [facility];  Service: Gastroenterology       Social History:  reports that he has never smoked. His smokeless tobacco use includes chew. He reports current alcohol use. He reports that he does not use drugs.     Family History: The patient's family history includes Basal cell carcinoma in his mother; Blindness in his mother; Depression in his maternal uncle; Diabetes in his mother; Gallbladder disease in his half-brother; Heart disease in his father, half-sister, and mother; Hyperlipidemia in his father and mother; Kidney disease in his half-sister; Lung disease in his  father; No Known Problems in his daughter and son; Obesity in his daughter, half-brother, and mother.Marland Kitchen                MEDICATIONS: Reviewed in EPIC. Pertinent as noted in history.    Allergies: Losartan and Opioids - morphine analogues    Review of Systems: 10 organ systems reviewed and pertinent as noted in HPI.      OBJECTIVE:     Physical Exam:  VITALS: Ht 188 cm (6' 2)  - Wt (!) 161.6 kg (356 lb 4.2 oz)  - BMI 45.74 kg/m??   GENERAL:Obese CM sitting upright in chair in NAD  PSYCH: Pleasant and appropriate  EYES: Sclera Anicteric  ENT: Trachea Midline  CV: Extremities appear warm and well-perfused.  RESP: Normal effort on room air, but dyspneic with seating strength exam  GI: Non-distended. Prominent central obesity.  MSK: Normal bulk and muscle tone.  SKIN: No apparent rashes   NEURO: AAOx3.  Speech fluent and coherent   - Strength:  ?? RUE: Shoulder Abduction 5/5, Elbow Extension 5/5, Elbow Flexion 5/5, Wrist Extension 5/5, Finger Abduction 5/5, Grip Strength 5/5  ?? LUE: Shoulder Abduction 5/5, Elbow Extension 5/5, Elbow Flexion 5/5, Wrist Extension 5/5, Finger Abduction 5/5, Grip Strength 5/5  ?? RLE: Hip Flexion 4+/5, Knee Extension 4+/5, Knee Flexion 4+/5, Dorsiflexion 4+/5, Plantarflexion 4+/5.  ?? LLE: Hip Flexion 4/5, Knee Extension 4-/5, Knee Flexion 4-/5, Dorsiflexion 2/5, Plantarflexion 3/5.   - Gait: Wide based, unsteady gait with high steppage of LLE in order to clear toes.  EXT: No cyanosis, clubbing or edema bilaterally     Diagnostic Studies: Reviewed documentation/imaging available through [facility] Everywhere or referring provider.      Tenny Craw, MD  [facility] PM&R PGY-2 Resident Physician

## 2019-11-21 NOTE — Unmapped (Signed)
It was a pleasure meeting you today!    We have referred you to Physical Therapy for a seating and positioning evaluation.     You can follow-up as needed. If you have any questions or concerns in the interim, please reach out via MyChart or call the PM&R Clinic at the number listed below.    [facility]  [facility]   [address] [place], [place] [address]   Phone: [phone]  Fax: [phone]

## 2019-11-22 MED ORDER — SEVELAMER CARBONATE 800 MG TABLET
ORAL_TABLET | 3 refills | 0 days
Start: 2019-11-22 — End: 2020-11-21

## 2019-11-22 MED ORDER — OMEPRAZOLE 40 MG CAPSULE,DELAYED RELEASE
ORAL_CAPSULE | Freq: Two times a day (BID) | ORAL | 3 refills | 90 days
Start: 2019-11-22 — End: ?
  Filled 2019-12-06: qty 180, 90d supply, fill #0

## 2019-11-24 MED ORDER — SEVELAMER CARBONATE 800 MG TABLET
ORAL_TABLET | 3 refills | 0.00000 days | Status: CP
Start: 2019-11-24 — End: 2020-11-23
  Filled 2019-12-06: qty 540, 90d supply, fill #0

## 2019-11-25 ENCOUNTER — Ambulatory Visit: Admit: 2019-11-25 | Discharge: 2019-11-26

## 2019-11-25 ENCOUNTER — Ambulatory Visit: Admit: 2019-11-25 | Discharge: 2019-11-25 | Discharge status: Against Medical Advice | Payer: MEDICARE

## 2019-11-25 NOTE — Unmapped (Signed)
Pt scheduled in [facility] but is having worsening shortness of breath. No recent COVID testing. Therefore I do not think it is safe for him to be seen [facility] given current protocols. We recommended he go to ED (He prefers [facility]) for evaluation of both COVID and cardiac causes of SOB.

## 2019-11-25 NOTE — Unmapped (Signed)
Pt went to [facility] ER after being refused to be seen in [facility] d/t shortness of breath. , stated would take 6 hours to get tested so did not stay.  Pt left [facility] ED.   RN offered pt drive through covid testing appt , pt denies need. States, I'll go somewhere else and get tested.    [name] yelling, everyone is a Best boy and hung up on RN, Pt returned call in a calm manner.    Will notify provider.

## 2019-11-25 NOTE — Unmapped (Signed)
Pt states every time I try to lie down at night I can't half breathe.   Bi Pap not helping me.  Denies need for ER, however will call if needed tonight.  Pt states he is at dry weight today.   152 kg    Asks for [facility] appt, scheduled @ 1pm 8/27

## 2019-11-29 ENCOUNTER — Ambulatory Visit: Admit: 2019-11-29 | Discharge: 2019-11-30 | Payer: MEDICARE

## 2019-11-30 NOTE — Unmapped (Signed)
Certified letter mail to patient wife and uploaded into her chart, earlier this week.

## 2019-12-06 MED FILL — FLUOXETINE 40 MG CAPSULE: ORAL | 90 days supply | Qty: 180 | Fill #3

## 2019-12-06 MED FILL — UNIFINE PENTIPS 31 GAUGE X 5/16" NEEDLE (8 MM): 80 days supply | Qty: 400 | Fill #3 | Status: AC

## 2019-12-06 MED FILL — ISOSORBIDE MONONITRATE ER 30 MG TABLET,EXTENDED RELEASE 24 HR: ORAL | 78 days supply | Qty: 78 | Fill #1

## 2019-12-06 MED FILL — OMEPRAZOLE 40 MG CAPSULE,DELAYED RELEASE: 90 days supply | Qty: 180 | Fill #0 | Status: AC

## 2019-12-06 MED FILL — ALLOPURINOL 100 MG TABLET: ORAL | 30 days supply | Qty: 60 | Fill #5

## 2019-12-06 MED FILL — NOVOLOG FLEXPEN U-100 INSULIN ASPART 100 UNIT/ML (3 ML) SUBCUTANEOUS: SUBCUTANEOUS | 90 days supply | Qty: 45 | Fill #1

## 2019-12-06 MED FILL — RENVELA 800 MG TABLET: 90 days supply | Qty: 540 | Fill #0 | Status: AC

## 2019-12-06 MED FILL — FLUOXETINE 40 MG CAPSULE: 90 days supply | Qty: 180 | Fill #3 | Status: AC

## 2019-12-06 MED FILL — AMIODARONE 200 MG TABLET: 90 days supply | Qty: 90 | Fill #1 | Status: AC

## 2019-12-06 MED FILL — LOPERAMIDE 2 MG CAPSULE: 26 days supply | Qty: 214 | Fill #2

## 2019-12-06 MED FILL — ALLOPURINOL 100 MG TABLET: 30 days supply | Qty: 60 | Fill #5 | Status: AC

## 2019-12-06 MED FILL — BRILINTA 90 MG TABLET: ORAL | 90 days supply | Qty: 180 | Fill #3

## 2019-12-06 MED FILL — ATORVASTATIN 80 MG TABLET: 90 days supply | Qty: 90 | Fill #3 | Status: AC

## 2019-12-06 MED FILL — BUMETANIDE 2 MG TABLET: 54 days supply | Qty: 108 | Fill #1 | Status: AC

## 2019-12-06 MED FILL — BRILINTA 90 MG TABLET: 90 days supply | Qty: 180 | Fill #3 | Status: AC

## 2019-12-06 MED FILL — METOPROLOL SUCCINATE ER 100 MG TABLET,EXTENDED RELEASE 24 HR: ORAL | 15 days supply | Qty: 15 | Fill #1

## 2019-12-06 MED FILL — AMIODARONE 200 MG TABLET: ORAL | 90 days supply | Qty: 90 | Fill #1

## 2019-12-06 MED FILL — ISOSORBIDE MONONITRATE ER 30 MG TABLET,EXTENDED RELEASE 24 HR: 78 days supply | Qty: 78 | Fill #1 | Status: AC

## 2019-12-06 MED FILL — NOVOLOG FLEXPEN U-100 INSULIN ASPART 100 UNIT/ML (3 ML) SUBCUTANEOUS: 90 days supply | Qty: 45 | Fill #1 | Status: AC

## 2019-12-06 MED FILL — BUMETANIDE 2 MG TABLET: ORAL | 54 days supply | Qty: 108 | Fill #1

## 2019-12-06 MED FILL — ELIQUIS 5 MG TABLET: 30 days supply | Qty: 60 | Fill #3 | Status: AC

## 2019-12-06 MED FILL — LEVEMIR FLEXTOUCH U-100 INSULIN 100 UNIT/ML (3 ML) SUBCUTANEOUS PEN: 40 days supply | Qty: 15 | Fill #2

## 2019-12-06 MED FILL — CHOLECALCIFEROL (VITAMIN D3) 25 MCG (1,000 UNIT) TABLET: 100 days supply | Qty: 100 | Fill #0 | Status: AC

## 2019-12-06 MED FILL — LOPERAMIDE 2 MG CAPSULE: 26 days supply | Qty: 214 | Fill #2 | Status: AC

## 2019-12-06 MED FILL — CHOLECALCIFEROL (VITAMIN D3) 25 MCG (1,000 UNIT) TABLET: ORAL | 100 days supply | Qty: 100 | Fill #0

## 2019-12-06 MED FILL — METOPROLOL SUCCINATE ER 100 MG TABLET,EXTENDED RELEASE 24 HR: 15 days supply | Qty: 15 | Fill #1 | Status: AC

## 2019-12-06 MED FILL — ATORVASTATIN 80 MG TABLET: ORAL | 90 days supply | Qty: 90 | Fill #3

## 2019-12-06 MED FILL — UNIFINE PENTIPS 31 GAUGE X 5/16" NEEDLE (8 MM): 80 days supply | Qty: 400 | Fill #3

## 2019-12-06 MED FILL — LEVEMIR FLEXTOUCH U-100 INSULIN 100 UNIT/ML (3 ML) SUBCUTANEOUS PEN: 40 days supply | Qty: 15 | Fill #2 | Status: AC

## 2019-12-06 MED FILL — ELIQUIS 5 MG TABLET: ORAL | 30 days supply | Qty: 60 | Fill #3

## 2019-12-11 NOTE — Unmapped (Signed)
I have reviewed the battery, threshold, lead impedance, and overall device/lead status, diagnostic data including arrhythmic events and therapies on the device evaluation on Jeremy Oliver, [id number].       Pollyann Kennedy, [place]

## 2019-12-14 ENCOUNTER — Ambulatory Visit: Admit: 2019-12-14 | Payer: MEDICARE

## 2019-12-15 ENCOUNTER — Ambulatory Visit
Admit: 2019-12-15 | Discharge: 2019-12-16 | Payer: MEDICARE | Attending: Cardiovascular Disease | Primary: Cardiovascular Disease

## 2019-12-15 DIAGNOSIS — Z992 Dependence on renal dialysis: Principal | ICD-10-CM

## 2019-12-15 DIAGNOSIS — N186 End stage renal disease: Principal | ICD-10-CM

## 2019-12-15 DIAGNOSIS — N184 Chronic kidney disease, stage 4 (severe): Secondary | ICD-10-CM

## 2019-12-15 DIAGNOSIS — Z794 Long term (current) use of insulin: Principal | ICD-10-CM

## 2019-12-15 DIAGNOSIS — I25118 Atherosclerotic heart disease of native coronary artery with other forms of angina pectoris: Principal | ICD-10-CM

## 2019-12-15 DIAGNOSIS — I480 Paroxysmal atrial fibrillation: Principal | ICD-10-CM

## 2019-12-15 DIAGNOSIS — E1122 Type 2 diabetes mellitus with diabetic chronic kidney disease: Secondary | ICD-10-CM

## 2019-12-15 NOTE — Unmapped (Signed)
Continue your current medications  Return to see me in 3 months

## 2019-12-15 NOTE — Unmapped (Signed)
PCP:  Kurtis Bushman, MD    Patient ID: Jeremy Oliver is a 49 y.o. male.    Assessment and Plan:   1. Coronary artery disease involving native coronary artery of native heart without angina pectoris  2. Chronic heart failure with reduced ejection fraction and diastolic dysfunction (CMS-[name])  3. Paroxysmal atrial fibrillation (CMS-[name])  4. Essential hypertension (RAF-[name])  5. CKD 5    Doing better today compared to previous visits but PM interrogation does show Afib with RVR which has probably been causing some of his symptoms and hypotensive episodes  Afib burden has improved this month and he remains on amiodarone- will refer to EP to discuss rhythm control options   There are not any additional options for PCI, given his living conditions and diet it is going to be very difficult to avoid fluid overload  He is otherwise on excellent medical therapy provided by Dr. Cherly Hensen and Dr. Hulan Fess we made no changes to his regimen today.    Will plan for a repeat lipid panel with next blood draw     Dictated using Dragon software, please excuse typos    Elpidio Anis  MD [facility]  Division of Cardiology   [place] [facility] ([facility])[place] office [phone]    Subjective:     [name] returns for follow-up. In dialysis yesterday his BP of 53/34 and reports frequent hypotensive episodes at dialysis. He reports heart rate fluctuations recently and will plan on checking his pacemaker today. He does not walk frequently and mostly uses a wheelchair. Went Monday and Wednesday this week but previously missed two dialysis appointments.      Patient Active Problem List   Diagnosis   ??? 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 Idiopathic chronic gout of left knee   ??? Hypercholesteremia   ??? Diabetes mellitus with coincident hypertension (CMS-[name])   ??? 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 Osteoarthritis   ??? Chronic nonalcoholic liver disease   ??? GERD (gastroesophageal reflux disease)   ??? Mild intermittent asthma without complication   ??? Chronic pain syndrome   ??? Paroxysmal atrial fibrillation (CMS-[name])   ??? OSA (obstructive sleep apnea)   ??? Tobacco use disorder   ??? Status post cardiac pacemaker procedure   ??? Diabetes mellitus with gastroparesis (CMS-[name])   ??? Cardiac pacemaker in situ   ??? Recurrent major depressive disorder, in full remission (CMS-[name])   ??? Adjustment disorder with mixed anxiety and depressed mood   ??? Hyperkalemia   ??? Acquired left foot drop   ??? Decreased sensation of lower extremity, left   ??? Chronic anticoagulation   ??? Type 2 diabetes mellitus with stage 4 chronic kidney disease, with long-term current use of insulin (CMS-[name])   ??? Situational stress   ??? Cough   ??? QT prolongation   ??? ESRD needing dialysis (CMS-[name])   ??? Palliative care by specialist   ??? Angina pectoris (CMS-[name])   ??? Psychophysiological insomnia     Most Recent Imaging:  LHC  08/19/19  1. There is??significant 2-vessel coronary artery disease. ??There is a 90% proximal LAD stenosis which extends into the proximal-LAD stent and 80% proximal LCx stenosis. The mid-LAD stent has mild ISR. The mid and distal RCA stents are widely patent. The RCA has  moderate diffuse distal disease.   2. Elevated??left ventricular filling pressures (LVEDP = 24??mm Hg).  3. Successful PCI to the??LAD and LCx??with the placement of a Synergy DES x2??with excellent angiographic result and TIMI 3 flow.  4. Rescue balloon angioplasty of the DIAG2 vessel that was occluded after dilation of the LAD stent    10/22/18  1.   Significant 90% stenosis of distal RCA, severe proximal LCx stenosis, and diffuse severe distal LAD stenosis  2.   Elevated LVEDP = 22   3.   Successful DES to the distal RCA with the placement of a Synergy 4.0 x 20 with excellent angiographic result and TIMI 3 flow.    05/12/18  5. Coronary artery disease including diffuse moderate to severe lesions in the left coronary system, with particular emphasis on the 90% proximal to mid LAD in-stent restenosis.  6. Successful PCI to the proximal to mid LAD with the placement of a Synergy drug eluting stent with excellent angiographic result and TIMI 3 flow.  7. Subsequent impaired flow at an adjacent moderate diagonal, which was small and had pre-existing ostial disease. Recrossed and treated with balloon angioplasty with restored flow.  8. Recent stent to RPDA/RPLA bifurcation widely patent.  9. Normal left ventricular filling pressures (LVEDP = 8 mm Hg).    03/12/18  1. Coronary artery disease including??severe mid RCA, and distal RCA bifurcation stenosis.  2. Left heart cath deferred  3. Successful??PCI??to the mid RCA and RPLA/RPDA bifurcation??with the placement of a Synergy DES x??with excellent angiographic result and TIMI 3 flow.    Echo   09/22/19    1. Technically difficult study.    2. The left ventricle is moderately to severely dilated in size with normal wall thickness.    3. The left ventricular systolic function is severely decreased, LVEF is visually estimated at 35%.    4. There is grade III diastolic dysfunction (severely elevated filling pressure).    5. The left atrium is severely dilated in size.    6. The right ventricle is upper normal in size, with reduced systolic function.    10/19/18  ?? Dilated left ventricle - mild  ?? Moderately decreased left ventricular systolic function, ejection fraction 35%  ?? Segmental wall motion abnormality - severe posterolateral hypokinesis  ?? Mitral regurgitation - mild  ?? Dilated left atrium - moderate  ?? Aortic sclerosis  ?? Normal right ventricular systolic function  ?? Technically difficult study due to chest wall/lung interference  ?? Ultrasound enhancing agent utilized to improve endocardial border definition  ?? Diastolic dysfunction - grade I (normal filling pressures)    [facility] Vessel Mapping Hemodialysis 11/21/19  Right: Normal Doppler waveforms detected in the brachial and ulnar arteries. No plaque in the brachial artery. Radial artery occluded and fills distally via a collateral. See chart above for vein diameters.  Left: Normal Doppler waveforms detected in the brachial, radial and ulnar arteries. No plaque in the brachial artery. No plaque in the radial artery. See chart above for vein diameters.    ECG 07/28/19  SINUS RHYTHM WITH 1ST DEGREE AV BLOCK  T WAVE ABNORMALITY, CONSIDER LATERAL ISCHEMIA    Current Outpatient Medications   Medication Sig Dispense Refill   ??? acetaminophen (TYLENOL) 500 MG tablet TAKE 2 TABLETS (1 000MG ) BY MOUTH EVERY 8 HOURS AS NEEDED FOR PAIN 180 tablet 0   ??? albuterol HFA 90 mcg/actuation inhaler Inhale 2 puffs into the lungs every 6 (six) hours as needed for wheezing or shortness  of breath. 18 g 2   ??? alirocumab (PRALUENT) 150 mg/mL subcutaneous injection Inject the contents of 1 pen (150 mg total) under the skin every fourteen (14) days. 6 mL 3   ??? allopurinoL (ZYLOPRIM) 100 MG tablet Take 2 tablets (200 mg total) by mouth daily. 60 tablet 5   ??? amiodarone (PACERONE) 200 MG tablet TAKE 1 TABLET BY MOUTH ONCE DAILY 90 tablet 3   ??? amLODIPine (NORVASC) 10 MG tablet Take 10 mg by mouth daily. Pre patient's wife taking daily. No Longer holding on Dialysis days     ??? apixaban (ELIQUIS) 5 mg Tab Take 1 tablet (5 mg total) by mouth Two (2) times a day. 60 tablet 11   ??? atorvastatin (LIPITOR) 80 MG tablet Take 1 tablet (80 mg total) by mouth daily. 90 tablet 3   ??? blood sugar diagnostic Strp Use as directed to check blood glucose Four (4) times a day (before meals and nightly). 300 each 3   ??? blood-glucose meter kit Disp. blood glucose meter kit preferred by patient's insurance. Dx: Diabetes, E11.9 1 each 11   ??? bumetanide (BUMEX) 2 MG tablet Take 1 tablet (2 mg total) by mouth two (2) times a day. On non dialysis days 180 tablet 3   ??? cholecalciferol, vitamin D3 25 mcg, 1,000 units,, 1,000 unit (25 mcg) tablet Take 1 tablet (25 mcg total) by mouth daily. 100 tablet 11   ??? diclofenac sodium (VOLTAREN) 1 % gel Apply 2 grams topically Three (3) times a day. 180 g 11   ??? empty container (SHARPS-A-GATOR DISPOSAL SYSTEM) Misc Use as directed for sharps disposal 1 each 2   ??? ezetimibe (ZETIA) 10 mg tablet TAKE 1 TABLET BY MOUTH ONCE DAILY 90 each 3   ??? FLUoxetine (PROZAC) 40 MG capsule Take 2 capsules (80 mg total) by mouth daily. (Patient taking differently: Take 80 mg by mouth nightly. ) 180 capsule 3   ??? gabapentin (NEURONTIN) 300 MG capsule Take 1 capsule (300 mg total) by mouth two (2) times a day. 180 capsule 3   ??? insulin ASPART (NOVOLOG FLEXPEN U-100 INSULIN) 100 unit/mL (3 mL) injection pen Inject 15 Units under the skin Three (3) times a day before meals. 45 mL 3   ??? insulin detemir U-100 (LEVEMIR FLEXTOUCH U-100 INSULN) 100 unit/mL (3 mL) injection pen Inject 37 units total under the skin nightly 15 mL 3   ??? insulin syringe-needle U-100 0.3 mL 31 gauge x 5/16 Syrg Use to inject insulin twice daily with meals 60 each 0   ??? isosorbide mononitrate (IMDUR) 30 MG 24 hr tablet Take 1 tablet (30 mg total) by mouth daily. DO NOT TAKE on dialysis days 120 tablet 3   ??? lancets Misc Use as directed to check blood glucose Four (4) times a day (before meals and nightly). 300 each 3   ??? loperamide (IMODIUM) 2 mg capsule Take 1 capsule twice a day and another capsule after each episode of diarrhea, up to 8 pills per day. 240 capsule 2   ??? metoprolol succinate (TOPROL XL) 100 MG 24 hr tablet Take 1 tablet (100 mg total) by mouth daily. 30 tablet 5   ??? miscellaneous medical supply (BLOOD PRESSURE CUFF) Misc Measure BP once a day or if any symptoms 1 each 0   ??? nitroglycerin (NITROSTAT) 0.4 MG SL tablet Place 1 tablet (0.4 mg total) under the tongue every five (5) minutes as needed for chest pain. 100 each 3   ???  omeprazole (PRILOSEC) 40 MG capsule Take 1 capsule (40 mg total) by mouth Two (2) times a day (30 minutes before a meal). 180 capsule 3   ??? ondansetron (ZOFRAN) 4 MG tablet Take 1 tablet (4 mg total) by mouth every twelve (12) hours as needed. 15 tablet 0   ??? semaglutide (OZEMPIC) 0.25 mg or 0.5 mg(2 mg/1.5 mL) PnIj injection Inject 0.5 mg under the skin every seven (7) days. 4.5 mL 3   ??? sevelamer (RENVELA) 800 mg tablet TAKE 2 TABLETS (1600MG ) BY MOUTH THREE TIMES DAILY WITH MEALS 540 tablet 3   ??? ticagrelor (BRILINTA) 90 mg Tab TAKE 1 TABLET BY MOUTH TWICE DAILY 180 tablet 3   ??? UNIFINE PENTIPS 31 gauge x 5/16 (8 mm) Ndle USE WITH INSULIN AND VICTOZA 5 TIMES DAILY 400 each 3     No current facility-administered medications for this visit.       Allergies   Allergen Reactions   ??? Losartan      Hyperkalemia (K>6)   ??? Opioids - Morphine Analogues Anxiety       Review of Systems  Pertinent items are noted in HPI.      Objective:      Physical Exam:  VITAL SIGNS: BP 126/81  - Pulse 73  - Ht 188 cm (6' 2)  - Wt (!) 161 kg (355 lb)  - SpO2 99%  - BMI 45.58 kg/m??   GENERAL: NAD  HEENT: Normocephalic and atraumatic.Conjunctivae and sclerae clear and anicteric. No xanthelasma.   NECK: Supple, without masses, thyroid enlargement or adenopathy.  CARDIOVASCULAR: RRR without m/r/g.  RESPIRATORY: Normal respiratory effort without use of accessory muscles. Clear to auscultation bilaterally.  ABDOMEN: Soft, not tender or distended, with audible bowel sounds. No palpable organ enlargement or abnormal masses.  EXTREMITIES:  No pretibial or ankle edema. Ambulatory ability satisfactory.  SKIN: No rashes, ecchymosis or petechiae.  NEUROLOGIC: Appropriate mood and affect. Alert and oriented to person, place, and time. No gross motor or sensory deficits evident.    Wt Readings from Last 6 Encounters:   12/15/19 (!) 161 kg (355 lb)   11/21/19 (!) 161.6 kg (356 lb 4.2 oz)   09/08/19 (!) 165.5 kg (364 lb 12.8 oz)   09/07/19 (!) 163.4 kg (360 lb 3.7 oz)   08/20/19 (!) 167.3 kg (368 lb 13.3 oz)   08/15/19 (!) 161.3 kg (355 lb 9.6 oz) Please excuse typos, dictation completed via Conservation officer, historic buildings      _____________________________________________________________  [employer] Attestation:         This document serves as a record of the services and decisions performed by Elpidio Anis, MD on 12/15/2019. It was created on his behalf by Inez Pilgrim, a trained medical scribe. The creation of this document is based on the provider's statements and observations that were conveyed to the medical scribe during the patient's encounter.     (The information in this document, created by the medical scribe for me, accurately reflects the services I personally performed and the decisions made by me. I have reviewed and approved this document for accuracy.)     Elpidio Anis  MD [facility]  Division of Cardiology   [place] [facility] [facility] [phone]  Pager [phone]

## 2019-12-15 NOTE — Unmapped (Signed)
[facility]  Cardiac Device Clinic-  [place]  Tel: [phone]  Fax: [phone]    Cardiac Implanted Electronic Device Evaluation-In Person Outpatient Visit    Visit Date:  12/15/2019    Manufacturer of Device: [employer]: Vigilant EL ICD [phone]  Type of Device: Nurse, adult      Battery: 12 years estimated longevity    Mode: DDDR  Lower rate limit: 60 bpm  Upper Tracking Limit: 130  bpm  Tachy settings:    Presenting rhythm:  AS/VS  Underlying rhythm: SR   Dependent: No    Pacing Percentages  AP: 6  RVP: <1    Impedence (ohms)  Atrial lead: 647  Right Ventricular lead: 304     Sensing (mV)  Atrial lead: 3.1 mV  Right Ventricular lead: 13.2 mV     Capture threshold (V @ ms)  Atrial lead: 0.80 V @ 0.40 ms  Right Ventricular lead: 0.80 V @ 0.40 ms    Diagnostics/Episodes  ?? AF Burden: 7% since 11/09/2019    Fluid Monitoring  ??  No Evidence of Fluid Accumulation       Reprogramming  ?? None    Follow-up Plan:  Continue Routine Remote Monitoring      Please see scanned and / or downloaded PDF file in this patient's chart for this encounter for full details of device interrogation and reprogramming.

## 2019-12-19 MED ORDER — ACETAMINOPHEN 500 MG TABLET
ORAL_TABLET | 0 refills | 0.00000 days
Start: 2019-12-19 — End: 2020-12-18

## 2019-12-21 MED ORDER — ACETAMINOPHEN 500 MG TABLET
ORAL_TABLET | ORAL | 0 refills | 0.00000 days | Status: CP
Start: 2019-12-21 — End: 2020-12-20
  Filled 2020-01-05: qty 180, 30d supply, fill #0

## 2019-12-23 DIAGNOSIS — F3342 Major depressive disorder, recurrent, in full remission: Principal | ICD-10-CM

## 2019-12-23 DIAGNOSIS — M109 Gout, unspecified: Principal | ICD-10-CM

## 2019-12-23 MED ORDER — BRILINTA 90 MG TABLET
ORAL_TABLET | ORAL | 3 refills | 0 days
Start: 2019-12-23 — End: 2020-12-22
  Filled 2020-03-02: qty 180, 90d supply, fill #0

## 2019-12-23 MED ORDER — ALBUTEROL SULFATE HFA 90 MCG/ACTUATION AEROSOL INHALER
RESPIRATORY_TRACT | 2 refills | 0 days
Start: 2019-12-23 — End: ?

## 2019-12-23 MED ORDER — FLUOXETINE 40 MG CAPSULE
ORAL_CAPSULE | Freq: Every day | ORAL | 3 refills | 90.00000 days
Start: 2019-12-23 — End: 2020-12-22

## 2019-12-23 MED ORDER — ALLOPURINOL 100 MG TABLET
ORAL_TABLET | Freq: Every day | ORAL | 5 refills | 30 days
Start: 2019-12-23 — End: 2020-12-22

## 2019-12-26 MED ORDER — FLUOXETINE 40 MG CAPSULE
ORAL_CAPSULE | Freq: Every day | ORAL | 0 refills | 90.00000 days | Status: CP
Start: 2019-12-26 — End: 2020-12-25
  Filled 2020-03-02: qty 180, 90d supply, fill #0

## 2019-12-26 MED ORDER — UNIFINE PENTIPS 31 GAUGE X 5/16" NEEDLE (8 MM)
3 refills | 0.00000 days | Status: CP
Start: 2019-12-26 — End: 2020-12-25
  Filled 2020-03-02: qty 400, 90d supply, fill #0

## 2019-12-26 MED ORDER — ATORVASTATIN 80 MG TABLET
ORAL_TABLET | Freq: Every day | ORAL | 3 refills | 90.00000 days | Status: CP
Start: 2019-12-26 — End: 2020-12-25
  Filled 2020-03-02: qty 90, 90d supply, fill #0

## 2019-12-29 ENCOUNTER — Ambulatory Visit: Admit: 2019-12-29 | Discharge: 2019-12-30 | Payer: MEDICARE

## 2020-01-05 MED FILL — VENTOLIN HFA 90 MCG/ACTUATION AEROSOL INHALER: RESPIRATORY_TRACT | 25 days supply | Qty: 18 | Fill #0

## 2020-01-05 MED FILL — ELIQUIS 5 MG TABLET: ORAL | 30 days supply | Qty: 60 | Fill #4

## 2020-01-05 MED FILL — ONDANSETRON HCL 4 MG TABLET: 8 days supply | Qty: 15 | Fill #1 | Status: AC

## 2020-01-05 MED FILL — ACETAMINOPHEN 500 MG TABLET: 30 days supply | Qty: 180 | Fill #0 | Status: AC

## 2020-01-05 MED FILL — VENTOLIN HFA 90 MCG/ACTUATION AEROSOL INHALER: 25 days supply | Qty: 18 | Fill #0 | Status: AC

## 2020-01-05 MED FILL — GABAPENTIN 300 MG CAPSULE: ORAL | 90 days supply | Qty: 180 | Fill #3

## 2020-01-05 MED FILL — ALLOPURINOL 100 MG TABLET: ORAL | 90 days supply | Qty: 180 | Fill #0

## 2020-01-05 MED FILL — METOPROLOL SUCCINATE ER 100 MG TABLET,EXTENDED RELEASE 24 HR: ORAL | 15 days supply | Qty: 15 | Fill #2

## 2020-01-05 MED FILL — ALLOPURINOL 100 MG TABLET: 90 days supply | Qty: 180 | Fill #0 | Status: AC

## 2020-01-05 MED FILL — LOPERAMIDE 2 MG CAPSULE: 3 days supply | Qty: 26 | Fill #3 | Status: AC

## 2020-01-05 MED FILL — LOPERAMIDE 2 MG CAPSULE: 3 days supply | Qty: 26 | Fill #3

## 2020-01-05 MED FILL — DICLOFENAC 1 % TOPICAL GEL: 34 days supply | Qty: 200 | Fill #0 | Status: AC

## 2020-01-05 MED FILL — HYDROXYZINE HCL 10 MG TABLET: ORAL | 30 days supply | Qty: 30 | Fill #3

## 2020-01-05 MED FILL — DICLOFENAC 1 % TOPICAL GEL: TOPICAL | 34 days supply | Qty: 200 | Fill #0

## 2020-01-05 MED FILL — ELIQUIS 5 MG TABLET: 30 days supply | Qty: 60 | Fill #4 | Status: AC

## 2020-01-05 MED FILL — GABAPENTIN 300 MG CAPSULE: 90 days supply | Qty: 180 | Fill #3 | Status: AC

## 2020-01-05 MED FILL — EZETIMIBE 10 MG TABLET: 60 days supply | Qty: 60 | Fill #3 | Status: AC

## 2020-01-05 MED FILL — EZETIMIBE 10 MG TABLET: ORAL | 60 days supply | Qty: 60 | Fill #3

## 2020-01-05 MED FILL — NITROGLYCERIN 0.4 MG SUBLINGUAL TABLET: SUBLINGUAL | 30 days supply | Qty: 100 | Fill #2

## 2020-01-05 MED FILL — METOPROLOL SUCCINATE ER 100 MG TABLET,EXTENDED RELEASE 24 HR: 15 days supply | Qty: 15 | Fill #2 | Status: AC

## 2020-01-05 MED FILL — HYDROXYZINE HCL 10 MG TABLET: 30 days supply | Qty: 30 | Fill #3 | Status: AC

## 2020-01-05 MED FILL — ONDANSETRON HCL 4 MG TABLET: ORAL | 8 days supply | Qty: 15 | Fill #1

## 2020-01-05 MED FILL — NITROGLYCERIN 0.4 MG SUBLINGUAL TABLET: 30 days supply | Qty: 100 | Fill #2 | Status: AC

## 2020-01-05 MED FILL — OZEMPIC 0.25 MG OR 0.5 MG (2 MG/1.5 ML) SUBCUTANEOUS PEN INJECTOR: 84 days supply | Qty: 4 | Fill #0 | Status: AC

## 2020-01-09 ENCOUNTER — Ambulatory Visit
Admit: 2020-01-09 | Discharge: 2020-02-07 | Payer: MEDICARE | Attending: Rehabilitative and Restorative Service Providers" | Primary: Rehabilitative and Restorative Service Providers"

## 2020-01-09 NOTE — Unmapped (Signed)
OUTPATIENT [facility] CLINIC  EVALUATION AND PLAN OF CARE     Patient Name: Jeremy Oliver  Date of Birth:1970/05/30  Date: 01/09/2020  Session Number:  1  Therapy Diagnosis:   Encounter Diagnosis   Name Primary?   ??? Chronic combined systolic and diastolic HF (heart failure) (CMS-[name])        Referring Practitioner: Merrily Brittle   Reason for Referral: ***  Height: ***  Weight: ***    COVID Mask use: {Maskspatientvisitor:67937}    Insurance:    1. *** (Member ID: ***)   2. ***(Member ID: ***)    Contact information:  Address: ***  Phone: ***  E-mail: ***    Assessment    49 y.o. male presents with *** which is limiting ***.  He requires a wheelchair for the majority of his mobility and thus needs a custom fit wheelchair.  We will be meeting with therapist and vendor next session to finalize the wheelchair set up.  Information has been sent to the vendor to begin the process of home evaluation and loaner provision.     Problem List: {Problem [phone]}    Clinical Decision Making: Data from the patient???s history and examination indicates the presence of { # personal factors/comorbidities:50651} that will impact the plan of care for the current problem, including {PT eval comorbity list:50659} Examination of body systems includes {# body structures:50652} body structures, functions, activity limitations and/or participation restrictions including {PT Eval Body systems:50660}. The assessed clinical presentation is {stability:50653} based on ***. These factors result in the need for {complexity level:50654} clinical decision making.    Goals:  Patient/Family Goals: ***       Goals for the Wheelchair: (Pt/family participated in goal setting)   - Pt will be fitted for a *** wheelchair for independence throughout the home and community.   - Pt will negotiate a *** wheelchair within the home independently.    Goals For the Seating System:    - The recommended cushion will optimize pressure distribution  - The recommended seating system will provide support needed to facilitate function/safety.  - The recommended seating system will provide corrective forces to assist with maintaining or improving posture  - The recommended seating system will enhance physiological function such [id number], swallowing, reduction of edema, reduction of pain.      Prognosis:  {Prognosis:33285}   Positive Indicators: {Renea's positive prognosis rationale:46381}   Negative Indicators: {Renea's negative prognosis rationale:46382}    Plan     Pt. will participate in: Wheelchair management/training    Planned frequency of treatment: wheelchair visit (1 additional visit)    Planned duration of treatment: 1 additional visit in Wheelchair Clinic with vendor and therapist    Date/Time of Clinic: ***    Subjective     Pt states: *** having issues walking long periods of time, feels like he needs help in the grocery store/[employer]    Reason for Referral/History of Present Condition/Onset of injury/exacerbation:   Jeremy Route Enck is a 49 y.o. male who presented to the Outpatient Wheelchair Clinic to pursue a *** wheelchair purchase.  *** Today, we assessed the patient's mobility needs and have found the following:  2019 had a heart attack (was on life support for 2 months)    Patient was given an opportunity to choose an appropriate vendor.  *** [employer] from *** was notified with results from screening evaluation and follow-up session for measurements and equipment trial was set up.      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, jogged 2 miles every day    Current Functional Status:  ***short distances - is having issues with walking, states he spends most of the day in bed due to fatigue after dialysis (3 days a week), avoids the kitchen due to flooring issue  Currently used Equipment: SPC, BSC, shower chair  Previous Treatment: just finished with HHPT, tried cardiac rehab  Recreation: ***    Employment: retired IT sales professional      Social History: Lives with wife, and a Management consultant, capability, willingness: as needed  Sleep Habits: (position, hours, interrupted?): ***   Vision: ***    Home Accessibility:   - Entry: stairs   - # of Floors: 1 story  - Bedroom on 1st floor,   - bathroom ***  Surfaces around Home:    - inside: ***   - outside: ***    Primary Mode of Transportation:  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 ***  - Vehicle Modifications: ***    ADL STATUS Assistance Level Comments   Dressing Independent    Bathing Independent Has a shower chair   Feeding Independent    Hygiene/Grooming Independent Varies between standing/seated based on how he is feeling   Toileting Independent Has a BSC if needed however doesn't use it   Meal Prep Independent Doesn't go into the kitchen due to the poor flooring   Home Mgmt. Independent      Cognitive Status Intact/Impaired Comments   Memory Skills Intact    Problem Solving Intact    Judgment Intact    Attn./Concentration Intact    Hearing Intact      Recent Procedures/Tests/Findings  MRI: ***  Surgical Procedures: ***    Past Medical History:   Diagnosis Date   ??? Angina at rest (CMS-[name])    ??? Arthritis    ??? Asthma    ??? Atrial fibrillation and flutter (CMS-[name]) 06/08/14   ??? BMI 40.0-44.9, adult (CMS-[name]) 10/29/2015   ??? Chronic anticoagulation 02/17/2018   ??? Chronic pain syndrome 04/11/2014    ~Use daily lyrica as recommended ~Tramadol as needed for pain ~Fluoxetine daily as recommended ~Daily exercise ~Eat a healthy diet ~Good control of your blood sugars can help    ??? Coronary artery disease    ??? Depression    ??? Diabetes mellitus (CMS-[name])    ??? ESRD (end stage renal disease) on dialysis (CMS-[name])    ??? Eye trauma 1998    glass in both eyes and removed   ??? GERD (gastroesophageal reflux disease)    ??? Gout ??? Homeless    ??? Hyperlipidemia    ??? Hypertension    ??? Illiteracy and low-level literacy    ??? Microscopic hematuria    ??? Migraine    ??? Nephrolithiasis    ??? 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 Nonalcoholic fatty liver disease    ???  NSTEMI (non-ST elevated myocardial infarction) (CMS-[name]) 01/13/2017   ??? Obesity    ??? Obstructive sleep apnea 10-02-2007   ??? Panic attacks    ??? 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 Seizure-like activity (CMS-[name])     ~Monitor for symptoms and report them to your primary care provider if they occur    ??? Systolic CHF, chronic (CMS-[name])     EF 40-45% Oct 02, 2015   ??? TIA (transient ischemic attack)      Family History   Problem Relation Age of Onset   ??? Diabetes Mother    ??? Hyperlipidemia Mother    ??? Heart disease Mother    ??? Basal cell carcinoma Mother    ??? Blindness Mother         diabeties   ??? Obesity Mother    ??? Hyperlipidemia Father    ??? Heart disease Father    ??? Lung disease Father    ??? Heart disease Half-Sister    ??? Kidney disease Half-Sister    ??? Gallbladder disease Half-Brother    ??? Obesity Half-Brother    ??? No Known Problems Daughter    ??? No Known Problems Son    ??? Obesity Daughter    ??? Depression Maternal Uncle         Died by suicide in 10-01-2017 by gunshot   ??? Melanoma Neg Hx    ??? Squamous cell carcinoma Neg Hx    ??? Macular degeneration Neg Hx    ??? Glaucoma Neg Hx         Past Surgical History:   Procedure Laterality Date   ??? CARDIAC CATHETERIZATION  03/08/2007   ??? CORONARY STENT PLACEMENT     ??? CYSTOURETHROSCOPY  12/31/2009     Microscopic hematuria   ??? KNEE SURGERY Right 09/23/2006    arthroscopic knee surgery medial and lateral meniscus tears as well as crystal disease   ??? PR CATH PLACE/CORON ANGIO, IMG SUPER/INTERP,R&L HRT CATH, L HRT VENTRIC N/A 02/13/2017    Procedure: Left/Right Heart Catheterization;  Surgeon: Marlaine Hind, MD;  Location: [facility] CATH;  Service: Cardiology   ??? PR CATH PLACE/CORON ANGIO, IMG SUPER/INTERP,W LEFT HEART VENTRICULOGRAPHY N/A 03/17/2017    Procedure: Left Heart Catheterization W Intervention;  Surgeon: Marlaine Hind, MD;  Location: [facility] CATH;  Service: Cardiology   ??? PR CATH PLACE/CORON ANGIO, IMG SUPER/INTERP,W LEFT HEART VENTRICULOGRAPHY N/A 05/12/2018    Procedure: CATH LEFT HEART CATHETERIZATION W INTERVENTION;  Surgeon: Dorathy Kinsman, MD;  Location: [facility] CATH;  Service: Cardiology   ??? PR CATH PLACE/CORON ANGIO, IMG SUPER/INTERP,W LEFT HEART VENTRICULOGRAPHY N/A 10/22/2018    Procedure: CATH LEFT HEART CATHETERIZATION W INTERVENTION;  Surgeon: Marlaine Hind, MD;  Location: [facility] CATH;  Service: Cardiology   ??? PR CATH PLACE/CORON ANGIO, IMG SUPER/INTERP,W LEFT HEART VENTRICULOGRAPHY N/A 08/19/2019    Procedure: Left Heart Catheterization W Intervention;  Surgeon: Marlaine Hind, MD;  Location: [facility] CATH;  Service: Cardiology   ??? PR ECMO/ECLS INITIATION VENO-ARTERIAL N/A 03/19/2017    Procedure: ECMO/ECLS; INITIATION, VENO-ARTERIAL;  Surgeon: Lennie Odor, MD;  Location: MA[facility];  Service: Cardiac Surgery   ??? PR ECMO/ECLS RMVL PRPH CANNULA OPEN 6 YRS & OLDER Left 03/25/2017    Procedure: ECMO / ECLS PROVIDED BY PHYSICIAN; REMOVAL OF PERIPHERAL (ARTERIAL AND/OR VENOUS) CANNULA(E), OPEN, 6 YEARS AND OLDER;  Surgeon: Alonna Buckler Ikonomidis, MD;  Location: MAIN OR [facility];  Service: Cardiac Surgery   ??? PR EPHYS EVAL W/ ABLATION SUPRAVENT  ARRHYTHMIA N/A 07/19/2015    Catheter ablation of cavotricuspid isthmus for atrial flutter [facility])   ??? PR INSER HART PACER XVENOUS ATRIAL N/A 05/30/2015    [employer] dual-chamber pacemaker implant (E.Chung)   ??? PR INSERT/PLACE FLOW DIRECT CATH N/A 03/17/2017    Procedure: Insert Leave In [place];  Surgeon: Marlaine Hind, MD;  Location: [facility] CATH;  Service: Cardiology   ??? PR INSJ/RPLCMT PERM DFB W/TRNSVNS LDS 1/DUAL CHMBR N/A 05/04/2017    Procedure: ICD Implant System (Single/Dual);  Surgeon: Eldred Manges, MD;  Location: [facility] CATH;  Service: Cardiology   ??? PR NEGATIVE PRESSURE WOUND THERAPY DME >50 SQ CM Left 03/25/2017    Procedure: Neg Press Wound Tx (Vac Assist) Incl Topicals, Per Session, Tsa Greater Than/= 50 Cm Squared;  Surgeon: Alonna Buckler Ikonomidis, MD;  Location: MAIN OR [facility];  Service: Cardiac Surgery   ??? PR PRQ TRLUML CORONARY STENT W/ANGIO ONE ART/BRNCH N/A 03/12/2018    Procedure: Percutaneous Coronary Intervention;  Surgeon: Marlaine Hind, MD;  Location: [facility] CATH;  Service: Cardiology   ??? PR REBL VES DIRECT,LOW EXTREM Left 03/19/2017    Procedure: Repr Bld Vessel Direct; Lower Extrem;  Surgeon: Boykin Reaper, MD;  Location: MAIN OR [facility];  Service: Vascular   ??? PR REMV ART CLOT ILIAC-POP,LEG INCIS Left 03/25/2017    Procedure: EMBOLECTOMY OR THROMBECTOMY, WITH OR WITHOUT CATHETER; FEMOROPOPLITEAL, AORTOILIAC ARTERY, BY LEG INCISION;  Surgeon: Alonna Buckler Ikonomidis, MD;  Location: MAIN OR [facility];  Service: Cardiac Surgery   ??? PR UPPER GI ENDOSCOPY,BIOPSY N/A 02/28/2016    Procedure: UGI ENDOSCOPY; WITH BIOPSY, SINGLE OR MULTIPLE;  Surgeon: Liane Comber, MD;  [facility]: [facility];  Service: Gastroenterology   ??? PR UPPER GI ENDOSCOPY,DIAGNOSIS N/A 05/07/2016    Procedure: UGI ENDO, INCLUDE ESOPHAGUS, STOMACH, & DUODENUM &/OR JEJUNUM; DX W/WO COLLECTION SPECIMN, BY BRUSH OR WASH;  Surgeon: Modena Nunnery, MD;  Location: [facility];  Service: Gastroenterology      Allergies   Allergen Reactions   ??? Losartan      Hyperkalemia (K>6)   ??? Opioids - Morphine Analogues Anxiety        Social History Tobacco Use   ??? Smoking status: Never Smoker   ??? Smokeless tobacco: Current User     Types: Chew   ??? Tobacco comment: 08/15/19:  Dips 1x a day./ /Pt in process of reducing tobacco use,  dips 1 can every 3 weeks,   Substance Use Topics   ??? Alcohol use: Yes     Alcohol/week: 0.0 standard drinks     Comment: rare use: couple times a year, small amount      Current Outpatient Medications   Medication Sig Dispense Refill   ??? acetaminophen (TYLENOL) 500 MG tablet TAKE 2 TABLETS (1000MG ) BY MOUTH EVERY 8 HOURS AS NEEDED FOR PAIN 180 tablet 0   ??? albuterol HFA 90 mcg/actuation inhaler Inhale 2 puffs into the lungs every 6 (six) hours as needed for wheezing or shortness of breath. 18 g 2   ??? alirocumab (PRALUENT) 150 mg/mL subcutaneous injection Inject the contents of 1 pen (150 mg total) under the skin every fourteen (14) days. 6 mL 3   ??? allopurinoL (ZYLOPRIM) 100 MG tablet Take 2 tablets (200 mg total) by mouth daily. 180 tablet 0   ??? amiodarone (PACERONE) 200 MG tablet TAKE 1 TABLET BY MOUTH ONCE DAILY 90 tablet 3   ??? amLODIPine (NORVASC) 10 MG tablet Take 10 mg by mouth  daily. Pre patient's wife taking daily. No Longer holding on Dialysis days     ??? apixaban (ELIQUIS) 5 mg Tab Take 1 tablet (5 mg total) by mouth Two (2) times a day. 60 tablet 11   ??? atorvastatin (LIPITOR) 80 MG tablet Take 1 tablet (80 mg total) by mouth daily. 90 tablet 3   ??? blood sugar diagnostic Strp Use as directed to check blood glucose Four (4) times a day (before meals and nightly). 300 each 3   ??? blood-glucose meter kit Disp. blood glucose meter kit preferred by patient's insurance. Dx: Diabetes, E11.9 1 each 11   ??? bumetanide (BUMEX) 2 MG tablet Take 1 tablet (2 mg total) by mouth two (2) times a day. On non dialysis days 180 tablet 3   ??? cholecalciferol, vitamin D3 25 mcg, 1,000 units,, 1,000 unit (25 mcg) tablet Take 1 tablet (25 mcg total) by mouth daily. 100 tablet 11   ??? diclofenac sodium (VOLTAREN) 1 % gel Apply 2 grams topically Three (3) times a day. 180 g 11   ??? empty container (SHARPS-A-GATOR DISPOSAL SYSTEM) Misc Use as directed for sharps disposal 1 each 2   ??? ezetimibe (ZETIA) 10 mg tablet TAKE 1 TABLET BY MOUTH ONCE DAILY 90 each 3   ??? FLUoxetine (PROZAC) 40 MG capsule Take 2 capsules (80 mg total) by mouth daily. 180 capsule 0   ??? gabapentin (NEURONTIN) 300 MG capsule Take 1 capsule (300 mg total) by mouth two (2) times a day. 180 capsule 3   ??? hydrOXYzine (ATARAX) 10 MG tablet Take 1 tablet (10 mg total) by mouth nightly as needed for anxiety. 30 tablet 6   ??? insulin ASPART (NOVOLOG FLEXPEN U-100 INSULIN) 100 unit/mL (3 mL) injection pen Inject 15 Units under the skin Three (3) times a day before meals. 45 mL 3   ??? insulin detemir U-100 (LEVEMIR FLEXTOUCH U-100 INSULN) 100 unit/mL (3 mL) injection pen Inject 37 units total under the skin nightly 15 mL 3   ??? insulin syringe-needle U-100 0.3 mL 31 gauge x 5/16 Syrg Use to inject insulin twice daily with meals 60 each 0   ??? isosorbide mononitrate (IMDUR) 30 MG 24 hr tablet Take 1 tablet (30 mg total) by mouth daily. DO NOT TAKE on dialysis days 120 tablet 3   ??? lancets Misc Use as directed to check blood glucose Four (4) times a day (before meals and nightly). 300 each 3   ??? loperamide (IMODIUM) 2 mg capsule Take 1 capsule twice a day and another capsule after each episode of diarrhea, up to 8 pills per day. 240 capsule 2   ??? metoprolol succinate (TOPROL XL) 100 MG 24 hr tablet Take 1 tablet (100 mg total) by mouth daily. (Patient taking differently: Take 100 mg by mouth Take as directed. Do not take on Dialysis days) 30 tablet 5   ??? miscellaneous medical supply (BLOOD PRESSURE CUFF) Misc Measure BP once a day or if any symptoms 1 each 0   ??? nitroglycerin (NITROSTAT) 0.4 MG SL tablet Place 1 tablet (0.4 mg total) under the tongue every five (5) minutes as needed for chest pain. 100 each 3   ??? omeprazole (PRILOSEC) 40 MG capsule Take 1 capsule (40 mg total) by mouth Two (2) times a day (30 minutes before a meal). 180 capsule 3   ??? ondansetron (ZOFRAN) 4 MG tablet Take 1 tablet (4 mg total) by mouth every twelve (12) hours as needed. 15 tablet 0   ???  semaglutide (OZEMPIC) 0.25 mg or 0.5 mg(2 mg/1.5 mL) PnIj injection Inject 0.5 mg under the skin every seven (7) days. 4.5 mL 3   ??? sevelamer (RENVELA) 800 mg tablet TAKE 2 TABLETS (1600MG ) BY MOUTH THREE TIMES DAILY WITH MEALS 540 tablet 3   ??? ticagrelor (BRILINTA) 90 mg Tab TAKE 1 TABLET BY MOUTH TWICE DAILY 180 tablet 0   ??? UNIFINE PENTIPS 31 gauge x 5/16 (8 mm) Ndle USE WITH INSULIN AND VICTOZA 5 TIMES DAILY 400 each 3     No current facility-administered medications for this visit.          Skin Integrity:    History of Pressure Sores:  had a pressure sore from AFO - has healed however was at leasta  Stage 3    Able to perform Pressure Relief per Therapeutic Standards: yes   Sitting Tolerance: as long as needed    Pain  Current:  /10   Max:  ***/10  Least:  ***/10  Nature of pain:  {DESC; PAIN PATTERN:[id number]}  Pain Frequency: whenever walking  Pain Aggravated By:   Pain Relieved By:     Objective     General Observations    Posture:               Sitting: rounded shoulders and foward head               Pelvic:  ***(Anterior/ Posterior/ Neutral Pelvic Tilt)     Obliquity:  ***(Left  Elevated/ Right Elevated/ None)                         Trunk: *** (Kyphosis/ Lordosis/ Normal)               Scoliosis: *** (C-Curve/S- Curve)               Hip Ad: ***               Windswept Deformity:*** (Left/ Right/ None)   Standing: {Standing Posture/Observation:[id number]}    Edema: is currently undergoing dialysis    Sensory    Sensation: bottom of R foot, knee down on the L     Musculoskeletal    Range of Motion/Flexibilty/MMT/Tone:    UE Range of Motion Strength Comments   Shoulder Flexion R: ***  L: *** 5/5    Shoulder Abduction R: ***  L: *** 5/5    Shoulder External Rotation R: ***  L: ***     Shoulder Internal Rotation R: ***  L: ***     [facility] Outpatient Medications   Medication Sig Dispense Refill   ??? acetaminophen (TYLENOL) 500 MG tablet TAKE 2 TABLETS (1000MG ) BY MOUTH EVERY 8 HOURS AS NEEDED FOR PAIN 180 tablet 0   ??? albuterol HFA 90 mcg/actuation inhaler Inhale 2 puffs into the lungs every 6 (six) hours as needed for wheezing or shortness of breath. 18 g 2   ??? alirocumab (PRALUENT) 150 mg/mL subcutaneous injection Inject the contents of 1 pen (150 mg total) under the skin every fourteen (14) days. 6 mL 3   ??? allopurinoL (ZYLOPRIM) 100 MG tablet Take 2 tablets (200 mg total) by mouth daily. 180 tablet 0   ??? amiodarone (PACERONE) 200 MG tablet TAKE 1 TABLET BY MOUTH ONCE DAILY 90 tablet 3   ??? amLODIPine (NORVASC) 10 MG tablet Take 10 mg by mouth daily. Pre patient's wife taking daily. No Longer holding on Dialysis days     ??? apixaban (  ELIQUIS) 5 mg Tab Take 1 tablet (5 mg total) by mouth Two (2) times a day. 60 tablet 11   ??? atorvastatin (LIPITOR) 80 MG tablet Take 1 tablet (80 mg total) by mouth daily. 90 tablet 3   ??? blood sugar diagnostic Strp Use as directed to check blood glucose Four (4) times a day (before meals and nightly). 300 each 3   ??? blood-glucose meter kit Disp. blood glucose meter kit preferred by patient's insurance. Dx: Diabetes, E11.9 1 each 11   ??? bumetanide (BUMEX) 2 MG tablet Take 1 tablet (2 mg total) by mouth two (2) times a day. On non dialysis days 180 tablet 3   ??? cholecalciferol, vitamin D3 25 mcg, 1,000 units,, 1,000 unit (25 mcg) tablet Take 1 tablet (25 mcg total) by mouth daily. 100 tablet 11   ??? diclofenac sodium (VOLTAREN) 1 % gel Apply 2 grams topically Three (3) times a day. 180 g 11   ??? empty container (SHARPS-A-GATOR DISPOSAL SYSTEM) Misc Use as directed for sharps disposal 1 each 2   ??? ezetimibe (ZETIA) 10 mg tablet TAKE 1 TABLET BY MOUTH ONCE DAILY 90 each 3   ??? FLUoxetine (PROZAC) 40 MG capsule Take 2 capsules (80 mg total) by mouth daily. 180 capsule 0   ??? gabapentin (NEURONTIN) 300 MG capsule Take 1 capsule ***    I attest that I have reviewed the above information.    Signed: Sarita Haver, PT, DPT  Clinical SCI Specialist   [facility]  01/09/2020 1:09 PM THREE TIMES DAILY WITH MEALS 540 tablet 3   ??? ticagrelor (BRILINTA) 90 mg Tab TAKE 1 TABLET BY MOUTH TWICE DAILY 180 tablet 0   ??? UNIFINE PENTIPS 31 gauge x 5/16 (8 mm) Ndle USE WITH INSULIN AND VICTOZA 5 TIMES DAILY 400 each 3     No current facility-administered medications for this visit.          Skin Integrity:    History of Pressure Sores:  had a pressure sore from AFO - has healed however was at leasta  Stage 3    Able to perform Pressure Relief per Therapeutic Standards: yes   Sitting Tolerance: as long as needed    Pain: no complaints however when therapist performed MMT to L hip flex, pt yelped in pain and became extremely dizzy, this is apparently typical and MD is working out the reasoning.  Pt states that when his pain becomes that high, he become dizzy and then for the rest of the day walks like i'm drunk    Objective     General Observations    Posture:               Sitting: rounded shoulders and foward head               Pelvic: difficult to ascertain any pelvic abnormalities due to redundant tissue   Standing: wide BOS, rounded shoulders, foward heard and heavily reliant on SPC    Edema: is currently undergoing dialysis, edema noted in L foot and ankle    Sensory    Sensation: bottom of R foot, knee down on the L     Musculoskeletal    Range of Motion/Flexibilty/MMT/Tone:    UE Range of Motion Strength Comments   Shoulder Flexion WFL for basic ADLs 5/5    Shoulder Abduction WFL for basic ADLs 5/5    Shoulder External Rotation WFL for basic ADLs 5/5    Shoulder Internal Rotation  WFL for basic ADLs 5/5    Elbow Extension WFL for basic ADLs 5/5    Elbow Flexion WFL for basic ADLs 5/5    Hand  Equal but weak      Fine Motor Control/Coordination:    Finger opposition with finger to thumb intact Bly    LE Range of Motion Strength Comments   Hip flex WFL for basic mobility, limited by redundant tissue R 4/5  L 3/5 **episode due to hip flex resistance testing, became loopy dizziness, pain increase, unable to walk   Hip abduction [facility] for basic mobility, limited by redundant tissue B 4/5    Hip adduction WFL for basic mobility, limited by redundant tissue B 4/5    Knee ext [facility] for basic mobility, limited by redundant tissue L 4/5  R 4/5    Knee flex WFL for basic mobility, limited by redundant tissue L 3/5  R 4/5    PF WFL for basic mobility, limited by redundant tissue L 4/5  R 4/5    DF L to neutral, R WFL  L 2/5  R 4/5      Mobility  Bed Mobility:    Sit<>supine:  Independent however needs extra time to complete     Transfers:   Sit<>stand: requires momentum and B UE to lift weight  Stand Pivot: slow and deliberate    Balance:    - feet apart, mild sway, 30 secs   - feet together, multiple near LOB requiring therapist to provide max a to prevent fall, pt immediately then needed to sit as he felt lightheaded    Gait Analysis: with SPC, Wide based, unsteady gait with high steppage of LLE in order to clear toes, required wife to be on his R to provide him with extra support when no wall to lean on, therapist providing min-mod A to reach the clinic doors  Duration: ~50'    Wheelchair Skills/Mobility: unable to propel manual wheelchair, will trial power in the home    Endurance: tolerated seated activity x 30 mins    Other:   - Patient is bed confined without the use of a wheelchair? yes  - This wheelchair is necessary in order for the patient to complete their MRADLS? Completes all currently from his bed    Education:  Topics:Adaptive Equipment  Education Provided to: patient and spouse  Education Type:Demonstration and Explanation  Response to education/teach back:Verbal understanding recieved    Patient in agreement with plan of care?:yes    Treatment Rendered:    PT Evaluation: 45 min   Self Care: 30 mins      Patient's Requests/Today's Discussion:     Scooters vs Group 2  Need for something portable but needs insurance's help to purchase  Concerned about the weight as his home has weak floorboards  Concerned about transporting the power wheelchair  Would benefit more from a Group 2 in terms of his current mobility and balance difficulties.    I attest that I have reviewed the above information.    Signed: Sarita Haver, PT, DPT  Clinical SCI Specialist   [facility]  01/09/2020 11:39 AM

## 2020-01-10 NOTE — Unmapped (Signed)
Patient has received a call to confirm an appointment that has been request by the provider.. The patient as agreed to the date/time provided by the scheduler.
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
## 2020-01-10 NOTE — Unmapped (Signed)
Dialysis Access Coordinator Note:  Jeremy Oliver, DOB: [birth year]  Contact: phone, does NOT seem to use mychart  Challenges: -  COVID-19 status: vaccinated as of 08/2019  Pt is - HAND dominant.     Issue/Concern:  Received ibm referral from Sherle Poe on 12/28/2019: needs a vascular access. ??Vessel mapping completed 8/23    Current Dialysis Access:   RIJ tunneled catheter [facility] 07/27/2019    Previous dialysis access history:   ?    Central Access history (tunneled catheters, PICC lines, ports, defibrillators, etc.):  ?    Vessel Mapping/Studies:   Vessel Mapping 11/21/2019     NOTES:   01/10/2020  Received ibm referral from Sherle Poe on 12/28/2019: needs a vascular access. ??Vessel mapping completed 8/23   Complex cardiac history  WQ referral placed for tracking purposes.   Request to schedule in clinic visit on or about 02/09/2020 sent.     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  PSH:  has a past surgical history that includes Knee surgery (Right, 09/23/2006); Cardiac catheterization (03/08/2007); Cystourethroscopy (12/31/2009 ); pr inser hart pacer xvenous atrial (N/A, 05/30/2015); pr ephys eval w/ ablation supravent arrhythmia (N/A, 07/19/2015); pr upper gi endoscopy,biopsy (N/A, 02/28/2016); pr upper gi endoscopy,diagnosis (N/A, 05/07/2016); pr cath place/coron angio, img super/interp,r&l hrt cath, l hrt ventric (N/A, 02/13/2017); pr cath place/coron angio, img super/interp,w left heart ventriculography (N/A, 03/17/2017); pr insert/place flow direct cath (N/A, 03/17/2017); pr ecmo/ecls initiation veno-arterial (N/A, 03/19/2017); pr rebl ves direct,low extrem (Left, 03/19/2017); pr ecmo/ecls rmvl prph cannula open 6 yrs & older (Left, 03/25/2017); pr remv art clot iliac-pop,leg incis (Left, 03/25/2017); pr negative pressure wound therapy dme >50 sq cm (Left, 03/25/2017); pr insj/rplcmt perm dfb w/trnsvns lds 1/dual chmbr (N/A, 05/04/2017); pr prq trluml coronary stent w/angio one art/brnch (N/A, 03/12/2018); pr cath place/coron angio, img super/interp,w left heart ventriculography (N/A, 05/12/2018); pr cath place/coron angio, img super/interp,w left heart ventriculography (N/A, 10/22/2018); Coronary stent placement; and pr cath place/coron angio, img super/interp,w left heart ventriculography (N/A, 08/19/2019).  Labs on 11/09/2019: sCr 4.69; GFR 13  Anticoagulant/Antiplatelet medications: apixaban ; Indications: hx MI ; This medication status was reviewed on: 01/10/2020 via chart review    Nephrologist: - referral from [facility]:  unknown  Dialysis Days:   unknown    Pt lives in: [place], [place]    [place], [place]  Dialysis Access Coordinator for Dr. Zara Chess of [facility]  [address], [place], [place] [address]  Desk: [phone]  Fax: [phone]

## 2020-01-13 ENCOUNTER — Other Ambulatory Visit: Payer: Self-pay

## 2020-01-13 ENCOUNTER — Emergency Department: Payer: Medicare Other

## 2020-01-13 ENCOUNTER — Observation Stay
Admission: EM | Admit: 2020-01-13 | Discharge: 2020-01-14 | Disposition: A | Discharge status: Home / Self Care | Payer: Medicare Other | Attending: Obstetrics and Gynecology | Admitting: Obstetrics and Gynecology

## 2020-01-13 DIAGNOSIS — N186 End stage renal disease: Secondary | ICD-10-CM | POA: Diagnosis not present

## 2020-01-13 DIAGNOSIS — Z87891 Personal history of nicotine dependence: Secondary | ICD-10-CM | POA: Diagnosis not present

## 2020-01-13 DIAGNOSIS — Z992 Dependence on renal dialysis: Secondary | ICD-10-CM | POA: Diagnosis not present

## 2020-01-13 DIAGNOSIS — E119 Type 2 diabetes mellitus without complications: Secondary | ICD-10-CM | POA: Diagnosis not present

## 2020-01-13 DIAGNOSIS — Z79899 Other long term (current) drug therapy: Secondary | ICD-10-CM | POA: Insufficient documentation

## 2020-01-13 DIAGNOSIS — I2510 Atherosclerotic heart disease of native coronary artery without angina pectoris: Secondary | ICD-10-CM | POA: Insufficient documentation

## 2020-01-13 DIAGNOSIS — Z20822 Contact with and (suspected) exposure to covid-19: Secondary | ICD-10-CM | POA: Insufficient documentation

## 2020-01-13 DIAGNOSIS — R079 Chest pain, unspecified: Principal | ICD-10-CM | POA: Insufficient documentation

## 2020-01-13 DIAGNOSIS — I132 Hypertensive heart and chronic kidney disease with heart failure and with stage 5 chronic kidney disease, or end stage renal disease: Secondary | ICD-10-CM | POA: Insufficient documentation

## 2020-01-13 DIAGNOSIS — R55 Syncope and collapse: Secondary | ICD-10-CM

## 2020-01-13 DIAGNOSIS — I480 Paroxysmal atrial fibrillation: Secondary | ICD-10-CM | POA: Diagnosis not present

## 2020-01-13 DIAGNOSIS — I5022 Chronic systolic (congestive) heart failure: Secondary | ICD-10-CM | POA: Diagnosis not present

## 2020-01-13 DIAGNOSIS — Z7901 Long term (current) use of anticoagulants: Secondary | ICD-10-CM | POA: Diagnosis not present

## 2020-01-13 DIAGNOSIS — Z794 Long term (current) use of insulin: Secondary | ICD-10-CM | POA: Insufficient documentation

## 2020-01-13 LAB — CBC WITH DIFFERENTIAL/PLATELET
Abs Immature Granulocytes: 0.02 10*3/uL (ref 0.00–0.07)
Basophils Absolute: 0.1 10*3/uL (ref 0.0–0.1)
Basophils Relative: 1 %
Eosinophils Absolute: 0.2 10*3/uL (ref 0.0–0.5)
Eosinophils Relative: 2 %
HCT: 34.4 % — ABNORMAL LOW (ref 39.0–52.0)
Hemoglobin: 10.8 g/dL — ABNORMAL LOW (ref 13.0–17.0)
Immature Granulocytes: 0 %
Lymphocytes Relative: 14 %
Lymphs Abs: 1.1 10*3/uL (ref 0.7–4.0)
MCH: 27 pg (ref 26.0–34.0)
MCHC: 31.4 g/dL (ref 30.0–36.0)
MCV: 86 fL (ref 80.0–100.0)
Monocytes Absolute: 0.5 10*3/uL (ref 0.1–1.0)
Monocytes Relative: 7 %
Neutro Abs: 5.9 10*3/uL (ref 1.7–7.7)
Neutrophils Relative %: 76 %
Platelets: 215 10*3/uL (ref 150–400)
RBC: 4 MIL/uL — ABNORMAL LOW (ref 4.22–5.81)
RDW: 14.5 % (ref 11.5–15.5)
WBC: 7.7 10*3/uL (ref 4.0–10.5)
nRBC: 0 % (ref 0.0–0.2)

## 2020-01-13 LAB — TROPONIN I (HIGH SENSITIVITY): Troponin I (High Sensitivity): 56 ng/L — ABNORMAL HIGH (ref <18)

## 2020-01-13 MED ORDER — ASPIRIN 81 MG PO CHEW
324.0000 mg | CHEWABLE_TABLET | Freq: Once | ORAL | Status: AC
  Administered 2020-01-13: 324 mg via ORAL
  Filled 2020-01-13: qty 4
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
## 2020-01-13 NOTE — ED Provider Notes (Signed)
[facility] Emergency Department Provider Note    ____________________________________________   I have reviewed the triage vital signs and the nursing notes.   HISTORY  Chief Complaint Loss of Consciousness and Chest Pain   History limited by: Not Limited   HPI Edwin [name] is a 49 y.o. male who presents to the emergency department today because of concerns for chest pain and an episode of syncope.  The patient states that he has been under a lot of stress today due to illness in the family.  He then went to a [place] shoot to try to relax with some friends.  As he was leaving he had to walk up a slight hill.  He then started developing chest pain.  He got in the car and according the wife all of a sudden slumped over.  They were able to get the patient out of the car and lay him on the gravel.  Wife states that he was breathing very fast and was unresponsive.  When EMS arrived they did give patient some nitroglycerin given that he was still complaining of some chest pressure.  Patient does have significant cardiac history and has a pacemaker defibrillator.  Patient does not think his defibrillator went off.  Records reviewed. Per medical record review patient has a history of CAD, MI, HLD, HTN.   Past Medical History:  Diagnosis Date  . CAD (coronary artery disease)   . Chronic systolic heart failure ([place])   . Diabetes mellitus without complication ([place])   . Heart attack ([place])   . Hyperlipidemia   . Hypertension   . Ischemic cardiomyopathy   . Paroxysmal atrial fibrillation [facility])     Patient Active Problem List   Diagnosis Date Noted  . Syncope and collapse 10/28/2018  . Chest pain 07/08/2018  . NSTEMI (non-ST elevated myocardial infarction) ([place]) 03/08/2018  . 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 [employer] pacemaker    . CARDIAC SURGERY    . IRRIGATION AND DEBRIDEMENT KNEE Left  12/17/2017   Procedure: LEFT KNEE DEBRIDEMENT AND SYNOVECTOMY;  Surgeon: Thornton Park, MD;  Location: [facility] ORS;  Service: Orthopedics;  Laterality: Left;  . JOINT REPLACEMENT    . LEFT HEART CATH AND CORONARY ANGIOGRAPHY Left 03/09/2018   Procedure: LEFT HEART CATH AND CORONARY ANGIOGRAPHY;  Surgeon: Nelva Bush, MD;  Location: [facility];  Service: Cardiovascular;  Laterality: Left;  . PERCUTANEOUS CORONARY STENT INTERVENTION (PCI-S)      Prior to Admission medications   Medication Sig Start Date End Date Taking? Authorizing Provider  albuterol (PROVENTIL HFA;VENTOLIN HFA) 108 (90 Base) MCG/ACT inhaler Inhale 2 puffs into the lungs every 6 (six) hours as needed for wheezing or shortness of breath. 04/29/18   Schuyler Amor, MD  Alirocumab 150 MG/ML SOAJ Inject 150 mg into the skin. Every 14 days 06/24/18   [provider]  allopurinol (ZYLOPRIM) 100 MG tablet Take 50 mg by mouth every other day. 03/18/18   [provider]  amiodarone (PACERONE) 200 MG tablet Take 200 mg by mouth daily. 09/14/17   [provider]  amLODipine (NORVASC) 10 MG tablet Take 10 mg by mouth daily. 09/30/18   [provider]  apixaban (ELIQUIS) 2.5 MG TABS tablet Take 1 tablet (2.5 mg total) by mouth 2 (two) times daily. 10/30/18   Vaughan Basta, MD  atorvastatin (LIPITOR) 80 MG tablet Take 80 mg by mouth every  evening.  09/14/17   [provider]  BRILINTA 90 MG TABS tablet Take 90 mg by mouth 2 (two) times daily. 09/14/17   [provider]  carvedilol (COREG) 3.125 MG tablet Take 1 tablet (3.125 mg total) by mouth See admin instructions. Take 1 tablet (25MG ) by mouth every morning and 2 tablets (50MG ) by mouth every night 10/30/18   Vaughan Basta, MD  cholecalciferol (VITAMIN D) 1000 units tablet Take 5,000 Units by mouth daily.    [provider]  ezetimibe (ZETIA) 10 MG tablet Take 10 mg by mouth daily. 10/14/17   [provider]  FLUoxetine (PROZAC) 20 MG capsule Take 60 mg by mouth at bedtime.  12/04/17   [provider]  gabapentin (NEURONTIN) 300 MG capsule Take 300 mg by mouth 2 (two) times a day. 09/27/18   [provider]  insulin aspart (NOVOLOG) 100 UNIT/ML injection Inject 15 Units into the skin 3 (three) times daily with meals. Patient taking differently: Inject 20 Units into the skin 3 (three) times daily with meals.  12/20/17   Henreitta Leber, MD  insulin detemir (LEVEMIR) 100 UNIT/ML injection Inject 0.4 mLs (40 Units total) into the skin 2 (two) times daily. Patient taking differently: Inject 40 Units into the skin at bedtime.  12/20/17   Henreitta Leber, MD  isosorbide mononitrate (IMDUR) 30 MG 24 hr tablet Take 1 tablet (30 mg total) by mouth daily. 10/30/18   Vaughan Basta, MD  Multiple Vitamin (MULTIVITAMIN WITH MINERALS) TABS tablet Take 1 tablet by mouth daily. 10/31/18   Vaughan Basta, MD  nitroGLYCERIN (NITROSTAT) 0.4 MG SL tablet Place 0.4 mg under the tongue every 5 (five) minutes as needed for chest pain.    [provider]  omeprazole (PRILOSEC) 20 MG capsule Take 20 mg by mouth daily. 05/28/18 05/28/19  [provider]  OZEMPIC, 0.25 OR 0.5 MG/DOSE, 2 MG/1.5ML SOPN Inject 0.5 mg into the skin every 7 (seven) days.  04/23/18   [provider]  QUEtiapine (SEROQUEL) 50 MG tablet Take 50 mg by mouth 2 (two) times daily as needed (headache).  03/18/18   [provider]  RENVELA 800 MG tablet Take 1,600 mg by mouth 3 (three) times daily with meals. 10/14/17   [provider]  VOLTAREN 1 % GEL Apply 2 g topically 3 (three) times daily as needed (left leg pain).  12/04/17   [provider]    Allergies Losartan and Morphine and related  Family History  Problem Relation Age of Onset  . Diabetes Mother   . Peripheral vascular disease Mother   . COPD Father     Social History Social History   Tobacco  Use  . 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 Smokeless tobacco: Current User    Types: Snuff  Substance Use Topics  . Alcohol use: Never  . Drug use: Never    Review of Systems Constitutional: No fever/chills Eyes: No visual changes. ENT: No sore throat. Cardiovascular: Denies chest pain. Respiratory: Denies shortness of breath. Gastrointestinal: No abdominal pain.  No nausea, no vomiting.  No diarrhea.   Genitourinary: Negative for dysuria. Musculoskeletal: Negative for back pain. Skin: Negative for rash. Neurological: Negative for headaches, focal weakness or numbness.  ____________________________________________   PHYSICAL EXAM:  VITAL SIGNS: ED Triage Vitals  Enc Vitals Group     BP 01/13/20 2145 123/80     Pulse Rate 01/13/20 2145 89     Resp 01/13/20 2145 15  Temp 01/13/20 2145 98.7 F (37.1 C)     Temp Source 01/13/20 2145 Oral     SpO2 01/13/20 2145 98 %     Weight 01/13/20 2146 (!) 362 lb 11.2 oz (164.5 kg)     Height 01/13/20 2146 6\' 3"  (1.905 m)     Head Circumference --      Peak Flow --      Pain Score 01/13/20 2145 0   Constitutional: Alert and oriented.  Eyes: Conjunctivae are normal.  ENT      Head: Normocephalic and atraumatic.      Nose: No congestion/rhinnorhea.      Mouth/Throat: Mucous membranes are moist.      Neck: No stridor. Hematological/Lymphatic/Immunilogical: No cervical lymphadenopathy. Cardiovascular: Normal rate, regular rhythm.  No murmurs, rubs, or gallops.  Respiratory: Normal respiratory effort without tachypnea nor retractions. Breath sounds are clear and equal bilaterally. No wheezes/rales/rhonchi. Gastrointestinal: Soft and non tender. No rebound. No guarding.  Genitourinary: Deferred Musculoskeletal: Normal range of motion in all extremities. No lower extremity edema. Neurologic:  Normal speech and language. No gross focal neurologic deficits are appreciated.  Skin:  Skin is warm, dry and intact. No rash  noted. Psychiatric: Mood and affect are normal. Speech and behavior are normal. Patient exhibits appropriate insight and judgment.  ____________________________________________    LABS (pertinent positives/negatives)  Trop hs 56 CBC wbc 7.7, hgb 10.8, plt 215  ____________________________________________   EKG  I, Nance Pear, attending physician, personally viewed and interpreted this EKG  EKG Time: 2154 Rate: sinus rhythm Rhythm: 85 Axis: left axis deviation Intervals: qtc 512 QRS: narrow, LVH ST changes: no st elevation Impression: abnormal ekg  ____________________________________________    RADIOLOGY  Apolonio Schneiders, attending physician, personally viewed and interpreted this EKG  EKG Time: 2154 Rate: 85 Rhythm: normal sinus rhythm Axis: left axis deviation Intervals: qtc 512 QRS: narrow ST changes: no st elevation, t waves inversion v3-v6 Impression: abnormal ekg   ____________________________________________   PROCEDURES  Procedures  ____________________________________________   INITIAL IMPRESSION / ASSESSMENT AND PLAN / ED COURSE  Pertinent labs & imaging results that were available during my care of the patient were reviewed by me and considered in my medical decision making (see chart for details).   Presented to the emergency department today because of episode of chest pain and syncope.  Patient does have significant cardiac history.  EKG without any concerning findings.  Patient was pain-free by the time my exam.  Family did request transfer to [facility] given continuation of care however [facility] did not have beds to accommodate him tonight.  Initial troponin was elevated.  Will plan on admission for further work-up and management.  ____________________________________________   FINAL CLINICAL IMPRESSION(S) / ED DIAGNOSES  Final diagnoses:  Chest pain, unspecified type     Note: This dictation was prepared with Dragon dictation. Any  transcriptional errors that result from this process are unintentional     Nance Pear, MD 01/14/20 [phone]

## 2020-01-13 NOTE — Unmapped (Unsigned)
Division of Cardiology - Electrophysiology       Date of Service: 01/13/2020    New Patient Clinic Note    PCP: Referring Provider:   Kurtis Bushman, MD  [address] [place] [address]  [place] [address]  Phone: [phone]  Fax: [phone] Roma Schanz, MD  [address]  [place] [address]  [place],  [place] [address]  Phone: [phone]  Fax: [phone]     Assessment and Plan:      Jeremy Oliver is a 49 y.o. male with ***.    Atrial Fibrillation:    HFrEF due to ***:    Palpitations:    No follow-ups on file.  The patient was seen and discussed with Dr. Excell Seltzer.      Subjective:      Reason for Visit: H/o VT s/p ICD implantation    History of Present Illness  Jeremy Oliver is a 48 y.o. 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    Cardiovascular History  ?? Sinus node dysfunction s/p PPM implantation, subsequent upgrade to ICD in 2019 for VT and cardiac arrest  ?? CAD  ?? Atrial flutter s/p CTI ablation  ?? 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 Paroxysmal atrial fibrillation  ?? Htn  ?? CKD5      Cardiovascular Studies     Date         Results     ECG     Echo 08/2019   1. Technically difficult study.    2. The left ventricle is moderately to severely dilated in size with normal wall thickness.    3. The left ventricular systolic function is severely decreased, LVEF is visually estimated at 35%.    4. There is grade III diastolic dysfunction (severely elevated filling pressure).    5. The left atrium is severely dilated in size.    6. The right ventricle is upper normal in size, with reduced systolic function.   Stress test     Cardiac catheterization 07/2019 1. There is??significant 2-vessel coronary artery disease. ??There is a 90% proximal LAD stenosis which extends into the proximal-LAD stent and 80% proximal LCx stenosis. The mid-LAD stent has mild ISR. The mid and distal RCA stents are widely patent. The RCA has moderate diffuse distal disease.   2. Elevated??left ventricular filling pressures (LVEDP = 24??mm Hg).  3. Successful PCI to the??LAD and LCx??with the placement of a Synergy DES x2??with excellent angiographic result and TIMI 3 flow.  4. Rescue balloon angioplasty of the DIAG2 vessel that was occluded after dilation of the LAD stent   Electrophysiology  05/2017      06/2015    05/2015 Upgrade of dual chamber PPM to ICD ([employer])      Atrial flutter ablation    Dual chamber PPM implantation   Cardiovascular Surgery     Peripheral Vascular Studies       Past Medical History  Past Medical History:   Diagnosis Date   ??? Angina at rest (CMS-[name])    ??? Arthritis    ??? Asthma    ??? Atrial fibrillation and flutter (CMS-[name]) 06/08/14   ??? BMI 40.0-44.9, adult (CMS-[name]) 10/29/2015   ??? Chronic anticoagulation 02/17/2018   ??? Chronic pain syndrome 04/11/2014    ~  Use daily lyrica as recommended ~Tramadol as needed for pain ~Fluoxetine daily as recommended ~Daily exercise ~Eat a healthy diet ~Good control of your blood sugars can help    ??? Coronary artery disease    ??? Depression    ??? Diabetes mellitus (CMS-[name])    ??? ESRD (end stage renal disease) on dialysis (CMS-[name])    ??? Eye trauma 1998    glass in both eyes and removed   ??? GERD (gastroesophageal reflux disease)    ??? Gout    ??? Homeless    ??? Hyperlipidemia    ??? Hypertension    ??? Illiteracy and low-level literacy    ??? Microscopic hematuria    ??? Migraine    ??? Nephrolithiasis    ??? 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 Nonalcoholic fatty liver disease    ??? NSTEMI (non-ST elevated myocardial infarction) (CMS-[name]) 01/13/2017   ??? Obesity    ??? Obstructive sleep apnea 2009   ??? Panic attacks    ??? 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 Seizure-like activity (CMS-[name])     ~Monitor for symptoms and report them to your primary care provider if they occur    ??? Systolic CHF, chronic (CMS-[name])     EF 40-45% 2017   ??? TIA (transient ischemic attack)      Past Surgical History  Past Surgical History:   Procedure Laterality Date   ??? CARDIAC CATHETERIZATION  03/08/2007   ??? CORONARY STENT PLACEMENT     ??? CYSTOURETHROSCOPY  12/31/2009     Microscopic hematuria   ??? KNEE SURGERY Right 09/23/2006    arthroscopic knee surgery medial and lateral meniscus tears as well as crystal disease   ??? PR CATH PLACE/CORON ANGIO, IMG SUPER/INTERP,R&L HRT CATH, L HRT VENTRIC N/A 02/13/2017    Procedure: Left/Right Heart Catheterization;  Surgeon: Marlaine Hind, MD;  Location: [facility] CATH;  Service: Cardiology   ??? PR CATH PLACE/CORON ANGIO, IMG SUPER/INTERP,W LEFT HEART VENTRICULOGRAPHY N/A 03/17/2017    Procedure: Left Heart Catheterization W Intervention;  Surgeon: Marlaine Hind, MD;  Location: [facility] CATH;  Service: Cardiology   ??? PR CATH PLACE/CORON ANGIO, IMG SUPER/INTERP,W LEFT HEART VENTRICULOGRAPHY N/A 05/12/2018    Procedure: CATH LEFT HEART CATHETERIZATION W INTERVENTION;  Surgeon: Dorathy Kinsman, MD;  Location: [facility] CATH;  Service: Cardiology   ??? PR CATH PLACE/CORON ANGIO, IMG SUPER/INTERP,W LEFT HEART VENTRICULOGRAPHY N/A 10/22/2018    Procedure: CATH LEFT HEART CATHETERIZATION W INTERVENTION;  Surgeon: Marlaine Hind, MD;  Location: [facility] CATH;  Service: Cardiology   ??? PR CATH PLACE/CORON ANGIO, IMG SUPER/INTERP,W LEFT HEART VENTRICULOGRAPHY N/A 08/19/2019    Procedure: Left Heart Catheterization W Intervention;  Surgeon: Marlaine Hind, MD;  Location: [facility] CATH;  Service: Cardiology   ??? PR ECMO/ECLS INITIATION VENO-ARTERIAL N/A 03/19/2017    Procedure: ECMO/ECLS; INITIATION, VENO-ARTERIAL;  Surgeon: Lennie Odor, MD;  Location: MAIN OR [facility];  Service: Cardiac Surgery   ??? PR ECMO/ECLS RMVL PRPH CANNULA OPEN 6 YRS & OLDER Left 03/25/2017    Procedure: ECMO / ECLS PROVIDED BY PHYSICIAN; REMOVAL  OF PERIPHERAL (ARTERIAL AND/OR VENOUS) CANNULA(E), OPEN, 6 YEARS AND OLDER;  Surgeon: Alonna Buckler Ikonomidis, MD;  Location: MAIN OR [facility];  Service: Cardiac Surgery   ??? PR EPHYS EVAL W/ ABLATION SUPRAVENT ARRHYTHMIA N/A 07/19/2015    Catheter ablation of cavotricuspid isthmus for atrial flutter [facility])   ??? PR INSER HART PACER XVENOUS ATRIAL N/A 05/30/2015    [employer] dual-chamber pacemaker implant (E.Chung)   ??? PR INSERT/PLACE FLOW DIRECT CATH N/A 03/17/2017    Procedure: Insert Leave In [place];  Surgeon: Marlaine Hind, MD;  Location: [facility] CATH;  Service: Cardiology   ??? PR INSJ/RPLCMT PERM DFB W/TRNSVNS LDS 1/DUAL CHMBR N/A 05/04/2017    Procedure: ICD Implant System (Single/Dual);  Surgeon: Eldred Manges, MD;  Location: [facility] CATH;  Service: Cardiology   ??? PR NEGATIVE PRESSURE WOUND THERAPY DME >50 SQ CM Left 03/25/2017    Procedure: Neg Press Wound Tx (Vac Assist) Incl Topicals, Per Session, Tsa Greater Than/= 50 Cm Squared;  Surgeon: Alonna Buckler Ikonomidis, MD;  Location: MAIN OR [facility];  Service: Cardiac Surgery   ??? PR PRQ TRLUML CORONARY STENT W/ANGIO ONE ART/BRNCH N/A 03/12/2018    Procedure: Percutaneous Coronary Intervention;  Surgeon: Marlaine Hind, MD;  Location: [facility] CATH;  Service: Cardiology   ??? PR REBL VES DIRECT,LOW EXTREM Left 03/19/2017    Procedure: Repr Bld Vessel Direct; Lower Extrem;  Surgeon: Boykin Reaper, MD;  Location: MAIN OR [facility];  Service: Vascular   ??? PR REMV ART CLOT ILIAC-POP,LEG INCIS Left 03/25/2017    Procedure: EMBOLECTOMY OR THROMBECTOMY, WITH OR WITHOUT CATHETER; FEMOROPOPLITEAL, AORTOILIAC ARTERY, BY LEG INCISION; Surgeon: Alonna Buckler Ikonomidis, MD;  Location: MAIN OR [facility];  Service: Cardiac Surgery   ??? PR UPPER GI ENDOSCOPY,BIOPSY N/A 02/28/2016    Procedure: UGI ENDOSCOPY; WITH BIOPSY, SINGLE OR MULTIPLE;  Surgeon: Liane Comber, MD;  [facility]: [facility];  Service: Gastroenterology   ??? PR UPPER GI ENDOSCOPY,DIAGNOSIS N/A 05/07/2016    Procedure: UGI ENDO, INCLUDE ESOPHAGUS, STOMACH, & DUODENUM &/OR JEJUNUM; DX W/WO COLLECTION SPECIMN, BY BRUSH OR WASH;  Surgeon: Modena Nunnery, MD;  [facility]: [facility];  Service: Gastroenterology     Medications  Current Outpatient Medications   Medication Sig Dispense Refill   ??? acetaminophen (TYLENOL) 500 MG tablet TAKE 2 TABLETS (1000MG ) BY MOUTH EVERY 8 HOURS AS NEEDED FOR PAIN 180 tablet 0   ??? albuterol HFA 90 mcg/actuation inhaler Inhale 2 puffs into the lungs every 6 (six) hours as needed for wheezing or shortness of breath. 18 g 2   ??? alirocumab (PRALUENT) 150 mg/mL subcutaneous injection Inject the contents of 1 pen (150 mg total) under the skin every fourteen (14) days. 6 mL 3   ??? allopurinoL (ZYLOPRIM) 100 MG tablet Take 2 tablets (200 mg total) by mouth daily. 180 tablet 0   ??? amiodarone (PACERONE) 200 MG tablet TAKE 1 TABLET BY MOUTH ONCE DAILY 90 tablet 3   ??? amLODIPine (NORVASC) 10 MG tablet Take 10 mg by mouth daily. Pre patient's wife taking daily. No Longer holding on Dialysis days     ??? apixaban (ELIQUIS) 5 mg Tab Take 1 tablet (5 mg total) by mouth Two (2) times a day. 60 tablet 11   ??? atorvastatin (LIPITOR) 80 MG tablet Take 1 tablet (80 mg total) by mouth daily. 90 tablet 3   ??? blood sugar diagnostic Strp Use as directed to check blood glucose Four (4) times a day (before meals and nightly). 300  each 3   ??? blood-glucose meter kit Disp. blood glucose meter kit preferred by patient's insurance. Dx: Diabetes, E11.9 1 each 11   ??? bumetanide (BUMEX) 2 MG tablet Take 1 tablet (2 mg total) by mouth two (2) times a day. On non dialysis days 180 tablet 3   ??? cholecalciferol, vitamin D3 25 mcg, 1,000 units,, 1,000 unit (25 mcg) tablet Take 1 tablet (25 mcg total) by mouth daily. 100 tablet 11   ??? diclofenac sodium (VOLTAREN) 1 % gel Apply 2 grams topically Three (3) times a day. 180 g 11   ??? empty container (SHARPS-A-GATOR DISPOSAL SYSTEM) Misc Use as directed for sharps disposal 1 each 2   ??? ezetimibe (ZETIA) 10 mg tablet TAKE 1 TABLET BY MOUTH ONCE DAILY 90 each 3   ??? FLUoxetine (PROZAC) 40 MG capsule Take 2 capsules (80 mg total) by mouth daily. 180 capsule 0   ??? gabapentin (NEURONTIN) 300 MG capsule Take 1 capsule (300 mg total) by mouth two (2) times a day. 180 capsule 3   ??? hydrOXYzine (ATARAX) 10 MG tablet Take 1 tablet (10 mg total) by mouth nightly as needed for anxiety. 30 tablet 6   ??? insulin ASPART (NOVOLOG FLEXPEN U-100 INSULIN) 100 unit/mL (3 mL) injection pen Inject 15 Units under the skin Three (3) times a day before meals. 45 mL 3   ??? insulin detemir U-100 (LEVEMIR FLEXTOUCH U-100 INSULN) 100 unit/mL (3 mL) injection pen Inject 37 units total under the skin nightly 15 mL 3   ??? insulin syringe-needle U-100 0.3 mL 31 gauge x 5/16 Syrg Use to inject insulin twice daily with meals 60 each 0   ??? isosorbide mononitrate (IMDUR) 30 MG 24 hr tablet Take 1 tablet (30 mg total) by mouth daily. DO NOT TAKE on dialysis days 120 tablet 3   ??? lancets Misc Use as directed to check blood glucose Four (4) times a day (before meals and nightly). 300 each 3   ??? loperamide (IMODIUM) 2 mg capsule Take 1 capsule twice a day and another capsule after each episode of diarrhea, up to 8 pills per day. 240 capsule 2   ??? metoprolol succinate (TOPROL XL) 100 MG 24 hr tablet Take 1 tablet (100 mg total) by mouth daily. (Patient taking differently: Take 100 mg by mouth Take as directed. Do not take on Dialysis days) 30 tablet 5   ??? miscellaneous medical supply (BLOOD PRESSURE CUFF) Misc Measure BP once a day or if any symptoms 1 each 0   ??? nitroglycerin (NITROSTAT) 0.4 MG SL tablet Place 1 tablet (0.4 mg total) under the tongue every five (5) minutes as needed for chest pain. 100 each 3   ??? omeprazole (PRILOSEC) 40 MG capsule Take 1 capsule (40 mg total) by mouth Two (2) times a day (30 minutes before a meal). 180 capsule 3   ??? ondansetron (ZOFRAN) 4 MG tablet Take 1 tablet (4 mg total) by mouth every twelve (12) hours as needed. 15 tablet 0   ??? semaglutide (OZEMPIC) 0.25 mg or 0.5 mg(2 mg/1.5 mL) PnIj injection Inject 0.5 mg under the skin every seven (7) days. 4.5 mL 3   ??? sevelamer (RENVELA) 800 mg tablet TAKE 2 TABLETS (1600MG ) BY MOUTH THREE TIMES DAILY WITH MEALS 540 tablet 3   ??? ticagrelor (BRILINTA) 90 mg Tab TAKE 1 TABLET BY MOUTH TWICE DAILY 180 tablet 0   ??? UNIFINE PENTIPS 31 gauge x 5/16 (8 mm) Ndle USE WITH INSULIN AND VICTOZA  5 TIMES DAILY 400 each 3     No current facility-administered medications for this visit.     Allergies  Allergies   Allergen Reactions   ??? Losartan      Hyperkalemia (K>6)   ??? Opioids - Morphine Analogues Anxiety     Social History  He  reports that he has never smoked. His smokeless tobacco use includes chew. He reports current alcohol use. He reports that he does not use drugs.    Family History  His family history includes Basal cell carcinoma in his mother; Blindness in his mother; Depression in his maternal uncle; Diabetes in his mother; Gallbladder disease in his half-brother; Heart disease in his father, half-sister, and mother; Hyperlipidemia in his father and mother; Kidney disease in his half-sister; Lung disease in his father; No Known Problems in his daughter and son; Obesity in his daughter, half-brother, and mother.    Review of Systems  10 systems were reviewed and negative except as noted in HPI.      Objective:      Vitals  There were no vitals taken for this visit.   Wt Readings from Last 3 Encounters:   12/15/19 (!) 161 kg (355 lb)   11/21/19 (!) 161.6 kg (356 lb 4.2 oz)   09/08/19 (!) 165.5 kg (364 lb 12.8 oz)     Physical Exam  General appearance: NAD, conversant  Eyes: anicteric sclerae, moist conjunctivae; no lid-lag  HENT: Atraumatic  Neck: Trachea midline; FROM, no thyromegaly  Lungs: CTA, with normal respiratory effort and no intercostal retractions  CV: RRR, no MRGs  Abdomen: Soft, non-tender  Extremities: No peripheral edema or extremity lymphadenopathy  Skin: Normal temperature, turgor and texture; no rash, ulcers or subcutaneous nodules  Psych: Appropriate affect, alert and oriented to person, place and time    ECG (01/13/2020)  {findings; ecg:[id number]}    Most Recent Labs   Lab Results   Component Value Date    NA 138 11/09/2019    K 4.2 11/09/2019    CL 106 11/09/2019    CO2 22.3 11/09/2019     Lab Results   Component Value Date    BUN 25 (H) 11/09/2019    BUN 32 (H) 09/14/2019    BUN, POCT 56 (H) 07/25/2019    BUN, POCT 57 (H) 07/22/2019     Lab Results   Component Value Date    Creatinine, POCT 5.8 (H) 07/25/2019    Creatinine, POCT 6.2 (H) 07/22/2019    Creatinine 4.69 (H) 11/09/2019    Creatinine 4.19 (H) 09/14/2019     Lab Results   Component Value Date    PRO-BNP 8,740.0 (H) 09/14/2019    PRO-BNP 7,270.0 (H) 08/18/2019    PRO-BNP 231 (H) 06/08/2014     Lab Results   Component Value Date    Cholesterol 213 (H) 04/14/2019    Cholesterol, Total 379 (H) 08/30/2012    Triglycerides 185 (H) 04/14/2019    Triglycerides 400 (H) 08/30/2012    HDL 39 (L) 04/14/2019    HDL 35 (L) 08/30/2012    Non-HDL Cholesterol 174 04/14/2019    LDL Calculated 137 (H) 04/14/2019    LDL Direct 239 08/30/2012    LDL Cholesterol, Calculated 264 08/30/2012     Lab Results   Component Value Date    HGB A1C, POC 7.2 (H) 04/12/2019    HGB A1C, POC 11.2 (H) 12/04/2017    HGB A1C, POC 11.0 (H) 06/22/2014    HGB A1C, POC >  14.0 (H) 06/08/2014    Hemoglobin A1C 7.2 (H) 04/14/2019    Hemoglobin A1C 7.9 (H) 10/11/2018       --------------------------  Heriberto Antigua, M.D.  Cardiology Fellow  Pager: [phone]

## 2020-01-14 ENCOUNTER — Observation Stay
Admit: 2020-01-14 | Discharge: 2020-01-14 | Disposition: A | Discharge status: Home / Self Care | Payer: Medicare Other | Attending: Family Medicine | Admitting: Family Medicine

## 2020-01-14 ENCOUNTER — Encounter: Payer: Self-pay | Admitting: Family Medicine

## 2020-01-14 DIAGNOSIS — R55 Syncope and collapse: Secondary | ICD-10-CM

## 2020-01-14 DIAGNOSIS — R079 Chest pain, unspecified: Secondary | ICD-10-CM

## 2020-01-14 DIAGNOSIS — I160 Hypertensive urgency: Secondary | ICD-10-CM

## 2020-01-14 DIAGNOSIS — I480 Paroxysmal atrial fibrillation: Secondary | ICD-10-CM

## 2020-01-14 LAB — BASIC METABOLIC PANEL
Anion gap: 10 (ref 5–15)
BUN: 36 mg/dL — ABNORMAL HIGH (ref 6–20)
CO2: 21 mmol/L — ABNORMAL LOW (ref 22–32)
Calcium: 7.9 mg/dL — ABNORMAL LOW (ref 8.9–10.3)
Chloride: 105 mmol/L (ref 98–111)
Creatinine, Ser: 5.55 mg/dL — ABNORMAL HIGH (ref 0.61–1.24)
GFR, Estimated: 11 mL/min — ABNORMAL LOW (ref >60)
Glucose, Bld: 340 mg/dL — ABNORMAL HIGH (ref 70–99)
Potassium: 4 mmol/L (ref 3.5–5.1)
Sodium: 136 mmol/L (ref 135–145)

## 2020-01-14 LAB — LIPID PANEL
Cholesterol: 196 mg/dL (ref 0–200)
HDL: 36 mg/dL — ABNORMAL LOW (ref >40)
LDL Cholesterol: 131 mg/dL — ABNORMAL HIGH (ref 0–99)
Total CHOL/HDL Ratio: 5.4 RATIO
Triglycerides: 143 mg/dL (ref <150)
VLDL: 29 mg/dL (ref 0–40)

## 2020-01-14 LAB — RESPIRATORY PANEL BY RT PCR (FLU A&B, COVID)
Influenza A by PCR: NEGATIVE
Influenza B by PCR: NEGATIVE
SARS Coronavirus 2 by RT PCR: NEGATIVE

## 2020-01-14 LAB — TROPONIN I (HIGH SENSITIVITY)
Troponin I (High Sensitivity): 73 ng/L — ABNORMAL HIGH (ref <18)
Troponin I (High Sensitivity): 73 ng/L — ABNORMAL HIGH (ref <18)
Troponin I (High Sensitivity): 76 ng/L — ABNORMAL HIGH (ref <18)

## 2020-01-14 LAB — GLUCOSE, CAPILLARY
Glucose-Capillary: 188 mg/dL — ABNORMAL HIGH (ref 70–99)
Glucose-Capillary: 171 mg/dL — ABNORMAL HIGH (ref 70–99)
Glucose-Capillary: 131 mg/dL — ABNORMAL HIGH (ref 70–99)

## 2020-01-14 LAB — HIV ANTIBODY (ROUTINE TESTING W REFLEX): HIV Screen 4th Generation wRfx: NONREACTIVE

## 2020-01-14 MED ORDER — CARVEDILOL 25 MG PO TABS
25.0000 mg | ORAL_TABLET | Freq: Every day | ORAL | Status: DC
  Administered 2020-01-14: 25 mg via ORAL
  Filled 2020-01-14: qty 1

## 2020-01-14 MED ORDER — METOPROLOL SUCCINATE ER 100 MG PO TB24: 100.0000 mg | ORAL_TABLET | Freq: Every day | ORAL | Status: DC

## 2020-01-14 MED ORDER — ALBUTEROL SULFATE HFA 108 (90 BASE) MCG/ACT IN AERS
2.0000 | INHALATION_SPRAY | Freq: Four times a day (QID) | RESPIRATORY_TRACT | Status: DC | PRN
  Filled 2020-01-14: qty 6.7

## 2020-01-14 MED ORDER — INSULIN ASPART 100 UNIT/ML ~~LOC~~ SOLN
20.0000 [IU] | Freq: Three times a day (TID) | SUBCUTANEOUS | Status: DC
  Administered 2020-01-14 (×2): 20 [IU] via SUBCUTANEOUS
  Filled 2020-01-14 (×2): qty 1

## 2020-01-14 MED ORDER — AMIODARONE HCL 200 MG PO TABS: 200.0000 mg | ORAL_TABLET | Freq: Two times a day (BID) | ORAL | Status: DC

## 2020-01-14 MED ORDER — CARVEDILOL 25 MG PO TABS: 50.0000 mg | ORAL_TABLET | Freq: Every day | ORAL | Status: DC

## 2020-01-14 MED ORDER — BUMETANIDE 1 MG PO TABS
2.0000 mg | ORAL_TABLET | Freq: Two times a day (BID) | ORAL | Status: DC
  Administered 2020-01-14: 2 mg via ORAL
  Filled 2020-01-14 (×2): qty 2

## 2020-01-14 MED ORDER — ALPRAZOLAM 0.25 MG PO TABS: 0.2500 mg | ORAL_TABLET | Freq: Two times a day (BID) | ORAL | Status: DC | PRN

## 2020-01-14 MED ORDER — PERFLUTREN LIPID MICROSPHERE
1.0000 mL | INTRAVENOUS | Status: AC | PRN
  Filled 2020-01-14: qty 10

## 2020-01-14 MED ORDER — INSULIN ASPART 100 UNIT/ML ~~LOC~~ SOLN
0.0000 [IU] | Freq: Three times a day (TID) | SUBCUTANEOUS | Status: DC
  Administered 2020-01-14: 2 [IU] via SUBCUTANEOUS
  Administered 2020-01-14: 3 [IU] via SUBCUTANEOUS
  Filled 2020-01-14 (×2): qty 1

## 2020-01-14 MED ORDER — INSULIN DETEMIR 100 UNIT/ML ~~LOC~~ SOLN
40.0000 [IU] | Freq: Every day | SUBCUTANEOUS | Status: DC
  Administered 2020-01-14: 40 [IU] via SUBCUTANEOUS
  Filled 2020-01-14: qty 0.4

## 2020-01-14 MED ORDER — ADULT MULTIVITAMIN W/MINERALS CH
1.0000 | ORAL_TABLET | Freq: Every day | ORAL | Status: DC
  Administered 2020-01-14: 1 via ORAL
  Filled 2020-01-14: qty 1

## 2020-01-14 MED ORDER — ISOSORBIDE MONONITRATE ER 60 MG PO TB24
30.0000 mg | ORAL_TABLET | Freq: Every day | ORAL | Status: DC
  Administered 2020-01-14: 30 mg via ORAL
  Filled 2020-01-14: qty 1

## 2020-01-14 MED ORDER — PANTOPRAZOLE SODIUM 40 MG PO TBEC
40.0000 mg | DELAYED_RELEASE_TABLET | Freq: Every day | ORAL | Status: DC
  Administered 2020-01-14: 40 mg via ORAL
  Filled 2020-01-14: qty 1

## 2020-01-14 MED ORDER — QUETIAPINE FUMARATE 25 MG PO TABS: 50.0000 mg | ORAL_TABLET | Freq: Two times a day (BID) | ORAL | Status: DC | PRN

## 2020-01-14 MED ORDER — EZETIMIBE 10 MG PO TABS
10.0000 mg | ORAL_TABLET | Freq: Every day | ORAL | Status: DC
  Administered 2020-01-14: 10 mg via ORAL
  Filled 2020-01-14: qty 1

## 2020-01-14 MED ORDER — LIDOCAINE VISCOUS HCL 2 % MT SOLN
15.0000 mL | Freq: Once | OROMUCOSAL | Status: AC
  Administered 2020-01-14: 15 mL via ORAL
  Filled 2020-01-14: qty 15

## 2020-01-14 MED ORDER — AMIODARONE HCL 200 MG PO TABS
200.0000 mg | ORAL_TABLET | Freq: Every day | ORAL | Status: DC
  Administered 2020-01-14: 200 mg via ORAL
  Filled 2020-01-14: qty 1

## 2020-01-14 MED ORDER — AMIODARONE HCL 200 MG PO TABS
200.0000 mg | ORAL_TABLET | Freq: Two times a day (BID) | ORAL | Status: DC
Start: 2020-01-14 — End: 2020-01-25

## 2020-01-14 MED ORDER — TICAGRELOR 90 MG PO TABS
90.0000 mg | ORAL_TABLET | Freq: Two times a day (BID) | ORAL | Status: DC
  Administered 2020-01-14: 90 mg via ORAL
  Filled 2020-01-14: qty 1

## 2020-01-14 MED ORDER — GABAPENTIN 300 MG PO CAPS
300.0000 mg | ORAL_CAPSULE | Freq: Two times a day (BID) | ORAL | Status: DC
  Administered 2020-01-14: 300 mg via ORAL
  Filled 2020-01-14: qty 1

## 2020-01-14 MED ORDER — ONDANSETRON HCL 4 MG/2ML IJ SOLN: 4.0000 mg | Freq: Four times a day (QID) | INTRAMUSCULAR | Status: DC | PRN

## 2020-01-14 MED ORDER — ATORVASTATIN CALCIUM 20 MG PO TABS: 80.0000 mg | ORAL_TABLET | Freq: Every evening | ORAL | Status: DC

## 2020-01-14 MED ORDER — FLUOXETINE HCL 20 MG PO CAPS
60.0000 mg | ORAL_CAPSULE | Freq: Every day | ORAL | Status: DC
  Administered 2020-01-14: 60 mg via ORAL
  Filled 2020-01-14: qty 3

## 2020-01-14 MED ORDER — ASPIRIN EC 81 MG PO TBEC
81.0000 mg | DELAYED_RELEASE_TABLET | Freq: Every day | ORAL | Status: DC
  Administered 2020-01-14: 81 mg via ORAL
  Filled 2020-01-14: qty 1

## 2020-01-14 MED ORDER — ATORVASTATIN CALCIUM 80 MG PO TABS: 80.0000 mg | ORAL_TABLET | Freq: Every day | ORAL | Status: DC

## 2020-01-14 MED ORDER — ACETAMINOPHEN 325 MG PO TABS: 650.0000 mg | ORAL_TABLET | ORAL | Status: DC | PRN

## 2020-01-14 MED ORDER — ZOLPIDEM TARTRATE 5 MG PO TABS: 5.0000 mg | ORAL_TABLET | Freq: Every evening | ORAL | Status: DC | PRN

## 2020-01-14 MED ORDER — ALLOPURINOL 100 MG PO TABS
50.0000 mg | ORAL_TABLET | ORAL | Status: DC
  Administered 2020-01-14: 50 mg via ORAL
  Filled 2020-01-14: qty 0.5

## 2020-01-14 MED ORDER — APIXABAN 2.5 MG PO TABS
2.5000 mg | ORAL_TABLET | Freq: Two times a day (BID) | ORAL | Status: DC
  Administered 2020-01-14: 2.5 mg via ORAL
  Filled 2020-01-14: qty 1

## 2020-01-14 MED ORDER — ENOXAPARIN SODIUM 40 MG/0.4ML ~~LOC~~ SOLN: 40.0000 mg | SUBCUTANEOUS | Status: DC

## 2020-01-14 MED ORDER — VITAMIN D3 25 MCG (1000 UNIT) PO TABS
5000.0000 [IU] | ORAL_TABLET | Freq: Every day | ORAL | Status: DC
  Administered 2020-01-14: 5000 [IU] via ORAL
  Filled 2020-01-14 (×2): qty 5

## 2020-01-14 MED ORDER — AMLODIPINE BESYLATE 10 MG PO TABS
10.0000 mg | ORAL_TABLET | Freq: Every day | ORAL | Status: DC
  Administered 2020-01-14: 10 mg via ORAL
  Filled 2020-01-14: qty 1

## 2020-01-14 MED ORDER — SEMAGLUTIDE(0.25 OR 0.5MG/DOS) 2 MG/1.5ML ~~LOC~~ SOPN: 0.5000 mg | PEN_INJECTOR | SUBCUTANEOUS | Status: DC

## 2020-01-14 MED ORDER — SEVELAMER CARBONATE 800 MG PO TABS
1600.0000 mg | ORAL_TABLET | Freq: Three times a day (TID) | ORAL | Status: DC
  Administered 2020-01-14 (×2): 1600 mg via ORAL
  Filled 2020-01-14 (×2): qty 2

## 2020-01-14 MED ORDER — ALUM & MAG HYDROXIDE-SIMETH 200-200-20 MG/5ML PO SUSP
30.0000 mL | Freq: Once | ORAL | Status: AC
  Administered 2020-01-14: 30 mL via ORAL
  Filled 2020-01-14: qty 30

## 2020-01-14 MED ORDER — NITROGLYCERIN 0.4 MG SL SUBL: 0.4000 mg | SUBLINGUAL_TABLET | SUBLINGUAL | Status: DC | PRN

## 2020-01-14 MED ORDER — SODIUM CHLORIDE 0.9 % IV SOLN: INTRAVENOUS | Status: DC

## 2020-01-14 NOTE — Progress Notes (Signed)
Patient alert and oriented, vss, no complaints of pain.  Escorted out of hospital via wheelchair by nursing staff.  D/c telemetry and PIV.

## 2020-01-14 NOTE — Progress Notes (Signed)
Pt received to 2A room 248 from Ed.  Pt oriented to room and call bell.  See assessment, vs's, strip, and admit data base for further details.  Pt denies pain at this time.

## 2020-01-14 NOTE — Consult Note (Signed)
Cardiology Consultation Note    Patient ID: Edwin [name], MRN: [id number], DOB/AGE: 1970-10-29 49 y.o. Admit date: 01/13/2020   Date of Consult: 01/14/2020 Primary Physician: Caryl Never, MD Primary Cardiologist: Dr. Howard Pouch, [facility]  Chief Complaint: chest pain Reason for Consultation: chest pain Requesting MD: Dr. Si Raider  HPI: Edwin [name] is a 49 y.o. male with history of of coronary artery disease with an STEMI in 62/0355 complicated by cardiogenic shock following cardiac arrest with ventricular tachycardia.  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 . Mild flat hsTrop elevation not consistent with acs. He is treated with amiodarone 200 mg daily, amlodipine 10 mg daily, apixaban 2.5 mg bid,brilinta, alirocumab,  asa, carvedilol, zetia, atorvastatin,bumex,imdur as outpatient. He has an ef of 35% by echo 08/2019 at [facility], No significant valvular abnormalities. His aicd is a [employer] device followed at [facility] and was evaluated 11/09/19 upper rate 130, lower rate 70, in DDD mode. He was AP 12% and 0% VP, He was in nsr with as/vs. He had a 2% af burden.  Approx 11 yr battery life. He states he had a left sided stabbing chest pain which occurred while he was [place] hunting. He states he had a syncopal episode during this episode and was hypertensive at the time. EKG showed nsr. He had a  cardiac cath 09/02/19 which revealed 2 vessel cad with a 90% proximal lad stenosis extending into the proximal lad stent and an 80% proximal lcx stenosis. RCA had moderate diffuse disease and widely patent stents in the the rca. He underwent pci of pad and lcx with synergy stents.  Patient is unaware whether or not his device fired but does not feel like it did.  Interrogation of his defibrillator reveals that he had 3 episodes of nonsustained V. tach treated with pacing and/or spontaneous resolution.  Appears to have begun with a breakthrough of A. fib with RVR.  Patient is currently in sinus rhythm and stable.  He has been compliant with his amiodarone.  Past Medical History:  Diagnosis Date  . CAD (coronary artery disease)   . Chronic systolic heart failure ([place])   . Diabetes mellitus without complication ([place])   . Heart attack ([place])   . Hyperlipidemia   . Hypertension   . Ischemic cardiomyopathy   . Paroxysmal atrial fibrillation [facility])       Surgical History:  Past Surgical History:  Procedure Laterality Date  . boston scientific pacemaker    . CARDIAC SURGERY    . IRRIGATION AND DEBRIDEMENT KNEE Left 12/17/2017   Procedure: LEFT KNEE DEBRIDEMENT AND SYNOVECTOMY;  Surgeon: Thornton Park, MD;  Location: [facility] ORS;  Service: Orthopedics;  Laterality: Left;  . JOINT REPLACEMENT    . LEFT HEART CATH AND CORONARY ANGIOGRAPHY Left 03/09/2018   Procedure: LEFT HEART CATH AND CORONARY ANGIOGRAPHY;  Surgeon: Nelva Bush, MD;  Location: [place] [facility];  Service: Cardiovascular;  Laterality: Left;  . PERCUTANEOUS CORONARY STENT INTERVENTION (PCI-S)  Home Meds: Prior to Admission medications   Medication Sig Start Date End Date Taking? Authorizing Provider  albuterol (PROVENTIL HFA;VENTOLIN HFA) 108 (90 Base) MCG/ACT inhaler Inhale 2 puffs into the lungs every 6 (six) hours as needed for wheezing or shortness of breath. 04/29/18   Schuyler Amor, MD  Alirocumab 150 MG/ML SOAJ  Inject 150 mg into the skin. Every 14 days 06/24/18   [provider]  allopurinol (ZYLOPRIM) 100 MG tablet Take 50 mg by mouth every other day. 03/18/18   [provider]  amiodarone (PACERONE) 200 MG tablet Take 200 mg by mouth daily. 09/14/17   [provider]  amLODipine (NORVASC) 10 MG tablet Take 10 mg by mouth daily. 09/30/18   [provider]  apixaban (ELIQUIS) 2.5 MG TABS tablet Take 1 tablet (2.5 mg total) by mouth 2 (two) times daily. 10/30/18   Vaughan Basta, MD  atorvastatin (LIPITOR) 80 MG tablet Take 80 mg by mouth every evening.  09/14/17   [provider]  BRILINTA 90 MG TABS tablet Take 90 mg by mouth 2 (two) times daily. 09/14/17   [provider]  bumetanide (BUMEX) 2 MG tablet Take 2 mg by mouth 2 (two) times daily. 12/06/19   [provider]  carvedilol (COREG) 3.125 MG tablet Take 1 tablet (3.125 mg total) by mouth See admin instructions. Take 1 tablet (25MG ) by mouth every morning and 2 tablets (50MG ) by mouth every night 10/30/18   Vaughan Basta, MD  cholecalciferol (VITAMIN D) 1000 units tablet Take 5,000 Units by mouth daily.    [provider]  ELIQUIS 5 MG TABS tablet Take 5 mg by mouth 2 (two) times daily. 01/03/20   [provider]  ezetimibe (ZETIA) 10 MG tablet Take 10 mg by mouth daily. 10/14/17   [provider]  FLUoxetine (PROZAC) 20 MG capsule Take 60 mg by mouth at bedtime.  12/04/17   [provider]  gabapentin (NEURONTIN) 300 MG capsule Take 300 mg by mouth 2 (two) times a day. 09/27/18   [provider]  hydrOXYzine (ATARAX/VISTARIL) 10 MG tablet Take 10 mg by mouth at bedtime. 01/03/20   [provider]  insulin aspart (NOVOLOG) 100 UNIT/ML injection Inject 15 Units into the skin 3 (three) times daily with meals. Patient taking differently: Inject 20 Units into the skin 3 (three) times daily with meals.  12/20/17   Henreitta Leber, MD   insulin detemir (LEVEMIR) 100 UNIT/ML injection Inject 0.4 mLs (40 Units total) into the skin 2 (two) times daily. Patient taking differently: Inject 40 Units into the skin at bedtime.  12/20/17   Henreitta Leber, MD  isosorbide mononitrate (IMDUR) 30 MG 24 hr tablet Take 1 tablet (30 mg total) by mouth daily. 10/30/18   Vaughan Basta, MD  metoprolol succinate (TOPROL-XL) 100 MG 24 hr tablet Take 100 mg by mouth daily. 01/03/20   [provider]  Multiple Vitamin (MULTIVITAMIN WITH MINERALS) TABS tablet Take 1 tablet by mouth daily. 10/31/18   Vaughan Basta, MD  nitroGLYCERIN (NITROSTAT) 0.4 MG SL tablet Place 0.4 mg under the tongue every 5 (five) minutes as needed for chest pain.    [provider]  omeprazole (PRILOSEC) 20 MG capsule Take 20 mg by mouth daily. 05/28/18 05/28/19  [provider]  ondansetron (ZOFRAN) 4 MG tablet Take 4 mg by mouth every 12 (twelve) hours as needed for nausea. 01/04/20   [provider]  OZEMPIC, 0.25 OR 0.5 MG/DOSE, 2 MG/1.5ML  SOPN Inject 0.5 mg into the skin every 7 (seven) days.  04/23/18   [provider]  QUEtiapine (SEROQUEL) 50 MG tablet Take 50 mg by mouth 2 (two) times daily as needed (headache).  03/18/18   [provider]  RENVELA 800 MG tablet Take 1,600 mg by mouth 3 (three) times daily with meals. 10/14/17   [provider]  VOLTAREN 1 % GEL Apply 2 g topically 3 (three) times daily as needed (left leg pain).  12/04/17   [provider]    Inpatient Medications:  . allopurinol  50 mg Oral QODAY  . amiodarone  200 mg Oral Daily  . amLODipine  10 mg Oral Daily  . apixaban  2.5 mg Oral BID  . aspirin EC  81 mg Oral Daily  . atorvastatin  80 mg Oral q1800  . bumetanide  2 mg Oral BID  . carvedilol  25 mg Oral Daily  . carvedilol  50 mg Oral q1800  . cholecalciferol  5,000 Units Oral Daily  . ezetimibe  10 mg Oral Daily  . FLUoxetine  60 mg Oral Daily  . gabapentin   300 mg Oral BID  . insulin aspart  0-15 Units Subcutaneous TID PC & HS  . insulin aspart  20 Units Subcutaneous TID WC  . insulin detemir  40 Units Subcutaneous Daily  . isosorbide mononitrate  30 mg Oral Daily  . multivitamin with minerals  1 tablet Oral Daily  . pantoprazole  40 mg Oral Daily  . Semaglutide(0.25 or 0.5MG /DOS)  0.5 mg Subcutaneous Q7 days  . sevelamer carbonate  1,600 mg Oral TID WC  . ticagrelor  90 mg Oral BID   . sodium chloride 100 mL/hr at 01/14/20 3149    Allergies:  Allergies  Allergen Reactions  . Losartan     Hyperkalemia (K>6)  . Morphine And Related Other (See Comments)    Overdose in hospital per patient report    Social History   Socioeconomic History  . Marital status: Married    Spouse name: Not on file  . Number of children: Not on file  . Years of education: Not on file  . Highest education level: Not on file  Occupational History  . Not on file  Tobacco Use  . 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 Smokeless tobacco: Current User    Types: Snuff  Vaping Use  . Vaping Use: Never used  Substance and Sexual Activity  . Alcohol use: Never  . Drug use: Never  . Sexual activity: Not Currently    Birth control/protection: None  Other Topics Concern  . Not on file  Social History Narrative  . Not on file   Social Determinants of Health   Financial Resource Strain:   . Difficulty of Paying Living Expenses: Not on file  Food Insecurity:   . Worried About Charity fundraiser in the Last Year: Not on file  . Ran Out of Food in the Last Year: Not on file  Transportation Needs:   . Lack of Transportation (Medical): Not on file  . Lack of Transportation (Non-Medical): Not on file  Physical Activity:   . Days of Exercise per Week: Not on file  . Minutes of Exercise per Session: Not on file  Stress:   . Feeling of Stress : Not on file  Social Connections:   . Frequency of Communication with Friends and Family: Not on file  . Frequency of  Social Gatherings with Friends and Family:  Not on file  . Attends Religious Services: Not on file  . Active Member of Clubs or Organizations: Not on file  . Attends Archivist Meetings: Not on file  . Marital Status: Not on file  Intimate Partner Violence:   . Fear of Current or Ex-Partner: Not on file  . Emotionally Abused: Not on file  . Physically Abused: Not on file  . Sexually Abused: Not on file     Family History  Problem Relation Age of Onset  . Diabetes Mother   . Peripheral vascular disease Mother   . COPD Father      Review of Systems: A 12-system review of systems was performed and is negative except as noted in the HPI.  Labs: No results for input(s): CKTOTAL, CKMB, TROPONINI in the last 72 hours. Lab Results  Component Value Date   WBC 7.7 01/13/2020   HGB 10.8 (L) 01/13/2020   HCT 34.4 (L) 01/13/2020   MCV 86.0 01/13/2020   PLT 215 01/13/2020    Recent Labs  Lab 01/13/20 2150  NA 136  K 4.0  CL 105  CO2 21*  BUN 36*  CREATININE 5.55*  CALCIUM 7.9*  GLUCOSE 340*   Lab Results  Component Value Date   CHOL 196 01/14/2020   HDL 36 (L) 01/14/2020   LDLCALC 131 (H) 01/14/2020   TRIG 143 01/14/2020   No results found for: DDIMER  Radiology/Studies:  DG Chest Portable 1 View  Result Date: 01/13/2020 CLINICAL DATA:  Chest pain EXAM: PORTABLE CHEST 1 VIEW COMPARISON:  October 28, 2018 FINDINGS: The heart size and mediastinal contours are unchanged with mild cardiomegaly. A left-sided ICD is noted. A right-sided central venous catheter seen with the tip in the right atrium. There is mild prominence of the central pulmonary vasculature. No large airspace consolidation or pleural effusion. Healed left mid rib fractures are seen. IMPRESSION: Stable cardiomegaly and mild pulmonary vascular congestion. Electronically Signed   By: Prudencio Pair M.D.   On: 01/13/2020 22:56    Wt Readings from Last 3 Encounters:  01/14/20 (!) 160.8 kg  10/30/18 (!) 149  kg  07/09/18 (!) 151.7 kg    EKG: Normal sinus rhythm with no ischemia  Physical Exam:  Blood pressure (!) 156/83, pulse 70, temperature 97.7 F (36.5 C), temperature source Oral, resp. rate (!) 21, height 6\' 2"  (1.88 m), weight (!) 160.8 kg, SpO2 99 %. Body mass index is 45.53 kg/m. General: Well developed, well nourished, in no acute distress. Head: Normocephalic, atraumatic, sclera non-icteric, no xanthomas, nares are without discharge.  Neck: Negative for carotid bruits. JVD not elevated. Lungs: Clear bilaterally to auscultation without wheezes, rales, or rhonchi. Breathing is unlabored. Heart: RRR with S1 S2. No murmurs, rubs, or gallops appreciated. Abdomen: Soft, non-tender, non-distended with normoactive bowel sounds. No hepatomegaly. No rebound/guarding. No obvious abdominal masses. Msk:  Strength and tone appear normal for age. Extremities: No clubbing or cyanosis. No edema.  Distal pedal pulses are 2+ and equal bilaterally. Neuro: Alert and oriented X 3. No facial asymmetry. No focal deficit. Moves all extremities spontaneously. Psych:  Responds to questions appropriately with a normal affect.     Assessment and Plan  49 year old male with history of coronary disease with history of ST elevation myocardial infarction in 7322 complicated by cardiac arrest and ventricular tachycardia.  Was treated with initially Impala followed by a hemodynamic support with ECMO and emergent PCI.  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 fib, HF R EF with an EF of 35%.  Admitted after left-sided chest pain and syncope.  Noted to have episode of A. fib with RVR/VT not treated with shock.  1.  Syncope-likely secondary to breakthrough of A. fib with RVR.  No AICD treatment.  Electrolytes are normal.  Mild troponin elevation likely secondary to  demand.  Will increase amiodarone to 200 mg twice daily and continue remainder of his medications.  He has an appointment with Dr. Willis Modena at [facility] scheduled and will schedule follow-up with Dr. Kerry Dory his primary cardiologist at [facility].  Should be okay for discharge unless he has further symptoms today.  Will discharge on amiodarone 200 mg twice daily, amlodipine 10 mg daily, atorvastatin 80 mg daily, Bumex 2 mg twice daily, ezetimibe 10 mg daily, isosorbide 30 mg daily, metoprolol succinate 100 mg daily and as needed nitroglycerin.  Would also continue with Brilinta 90 mg twice daily and apixaban 2.5 mg twice daily due to his atrial fibrillation and coronary disease with recent stents.  2.  Coronary disease-status post STEMI in 2018 with cardiac cath in June of this year showing two-vessel coronary disease with placement of drug-eluting stents in the proximal LAD and proximal circumflex.  RCA had diffuse disease with widely patent stent.  We will continue with current therapy including Brilinta.  Will need this for at least 1 year until June 2022.  Mildly elevated troponin appears to be demand secondary to his atrial and ventricular arrhythmia.  Does not appear to be secondary to acute coronary syndrome.  His symptoms were atypical for his angina.  Would continue with atorvastatin at 80 mg daily, isosorbide mononitrate at 30 mg daily, metoprolol succinate 100 mg daily.  3.  Congestive heart failure-HFrEF.  Chest x-ray showed mild pulmonary vascular congestion with cardiomegaly.  Asymptomatic at present.  Would continue with Bumex at 2 mg twice daily.  4.  Hypertension-blood pressure appears to be in good control.  5.  End-stage renal disease-continue with hemodialysis.  Does not appear to be severely volume overloaded or with significant electrolyte abnormalities at present.  Would defer to timing of next dialysis to nephrology.  6.  Atrial fibrillation-appears to have had breakthrough causing  the symptoms.  Will increase amiodarone to 200 mg twice daily and encourage early follow-up with his primary cardiologist as well as electrophysiologist.  ICD appears to be functioning normally.  Has a proximal 11.5 years of battery life remaining.  Continue anticoagulate with Eliquis at 2.5 mg twice daily.  Signed, Teodoro Spray MD 01/14/2020, 8:31 AM Pager: [phone]

## 2020-01-14 NOTE — H&P (Signed)
[place]   [name]    MR#:  [id number]  DATE OF BIRTH:  May 18, 1970  DATE OF ADMISSION:  01/13/2020  PRIMARY CARE PHYSICIAN: Caryl Never, MD   REQUESTING/REFERRING PHYSICIAN: Nance Pear, MD  CHIEF COMPLAINT:   Chief Complaint  Patient presents with  . Loss of Consciousness  . Chest Pain    HISTORY OF PRESENT ILLNESS:  Edwin [name]  is a 49 y.o. obese Caucasian male with a known history of coronary artery disease, systolic CHF, type II diabetes mellitus, hypertension and dyslipidemia as well as paroxysmal atrial fibrillation, who presented to the emergency room with acute onset of left-sided chest pain felt as stabbing pain that was moderate severe intensity with associated nausea without vomiting or diaphoresis.  This occurred while he was doing [place] hunting and walking uphill.  He then got into his car and had a syncopal episode for a couple minutes per his wife who got him out of the car and laid him on the gravel.  His blood pressure was elevated 233/150 per his report.  Upon arrival of EMS, he was given sublingual nitroglycerin.  He admitted to radiation of his chest pain to his left neck.  No cough or wheezing hemoptysis or leg pain or worsening edema or recent travels or surgeries.  No headache or dizziness or blurred vision.  No dysuria, oliguria or hematuria or flank pain.  Upon presentation to the emergency room, vital signs were within normal.  Labs revealed hyperglycemia of 340 and a BUN of 36 with creatinine 5.55, high-sensitivity troponin I of 56 and later 73 and CBC showing anemia that is stable.  Portable chest ray showed  stable cardiomegaly with mild pulmonary vascular congestion. EKG showed normal sinus rhythm with rate of 85 with T wave inversion laterally, QT prolongation with QTC of 512 MS and poor R wave progression.  The patient was given 40 aspirin.  He will be admitted to an observation progressive unit bed for evaluation and  management. PAST MEDICAL HISTORY:   Past Medical History:  Diagnosis Date  . CAD (coronary artery disease)   . Chronic systolic heart failure ([place])   . Diabetes mellitus without complication ([place])   . Heart attack ([place])   . Hyperlipidemia   . Hypertension   . Ischemic cardiomyopathy   . Paroxysmal atrial fibrillation ([place])     PAST SURGICAL HISTORY:   Past Surgical History:  Procedure Laterality Date  . [employer] pacemaker    . CARDIAC SURGERY    . IRRIGATION AND DEBRIDEMENT KNEE Left 12/17/2017   Procedure: LEFT KNEE DEBRIDEMENT AND SYNOVECTOMY;  Surgeon: Thornton Park, MD;  Location: [facility] OR[facility];  Service: Orthopedics;  Laterality: Left;  . JOINT REPLACEMENT    . LEFT HEART CATH AND CORONARY ANGIOGRAPHY Left 03/09/2018   Procedure: LEFT HEART CATH AND CORONARY ANGIOGRAPHY;  Surgeon: Nelva Bush, MD;  Location: [facility];  Service: Cardiovascular;  Laterality: Left;  . PERCUTANEOUS CORONARY STENT INTERVENTION (PCI-S)      SOCIAL HISTORY:   Social History   Tobacco Use  . 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 Smokeless tobacco: Current User    Types: Snuff  Substance Use Topics  . Alcohol use: Never    FAMILY HISTORY:   Family History  Problem Relation Age of Onset  . Diabetes Mother   . Peripheral vascular disease Mother   . COPD Father     DRUG ALLERGIES:   Allergies  Allergen  Reactions  . Losartan     Hyperkalemia (K>6)  . Morphine And Related Other (See Comments)    Overdose in hospital per patient report    REVIEW OF SYSTEMS:   ROS As per history of present illness. All pertinent systems were reviewed above. Constitutional, HEENT, cardiovascular, respiratory, GI, GU, musculoskeletal, neuro, psychiatric, endocrine, integumentary and hematologic systems were reviewed and are otherwise negative/unremarkable except for positive findings mentioned above in the HPI.   MEDICATIONS AT HOME:   Prior to Admission medications     Medication Sig Start Date End Date Taking? Authorizing Provider  albuterol (PROVENTIL HFA;VENTOLIN HFA) 108 (90 Base) MCG/ACT inhaler Inhale 2 puffs into the lungs every 6 (six) hours as needed for wheezing or shortness of breath. 04/29/18   Schuyler Amor, MD  Alirocumab 150 MG/ML SOAJ Inject 150 mg into the skin. Every 14 days 06/24/18   [provider]  allopurinol (ZYLOPRIM) 100 MG tablet Take 50 mg by mouth every other day. 03/18/18   [provider]  amiodarone (PACERONE) 200 MG tablet Take 200 mg by mouth daily. 09/14/17   [provider]  amLODipine (NORVASC) 10 MG tablet Take 10 mg by mouth daily. 09/30/18   [provider]  apixaban (ELIQUIS) 2.5 MG TABS tablet Take 1 tablet (2.5 mg total) by mouth 2 (two) times daily. 10/30/18   Vaughan Basta, MD  atorvastatin (LIPITOR) 80 MG tablet Take 80 mg by mouth every evening.  09/14/17   [provider]  BRILINTA 90 MG TABS tablet Take 90 mg by mouth 2 (two) times daily. 09/14/17   [provider]  bumetanide (BUMEX) 2 MG tablet Take 2 mg by mouth 2 (two) times daily. 12/06/19   [provider]  carvedilol (COREG) 3.125 MG tablet Take 1 tablet (3.125 mg total) by mouth See admin instructions. Take 1 tablet (25MG ) by mouth every morning and 2 tablets (50MG ) by mouth every night 10/30/18   Vaughan Basta, MD  cholecalciferol (VITAMIN D) 1000 units tablet Take 5,000 Units by mouth daily.    [provider]  ELIQUIS 5 MG TABS tablet Take 5 mg by mouth 2 (two) times daily. 01/03/20   [provider]  ezetimibe (ZETIA) 10 MG tablet Take 10 mg by mouth daily. 10/14/17   [provider]  FLUoxetine (PROZAC) 20 MG capsule Take 60 mg by mouth at bedtime.  12/04/17   [provider]  gabapentin (NEURONTIN) 300 MG capsule Take 300 mg by mouth 2 (two) times a day. 09/27/18   [provider]  hydrOXYzine (ATARAX/VISTARIL) 10 MG tablet Take 10 mg by  mouth at bedtime. 01/03/20   [provider]  insulin aspart (NOVOLOG) 100 UNIT/ML injection Inject 15 Units into the skin 3 (three) times daily with meals. Patient taking differently: Inject 20 Units into the skin 3 (three) times daily with meals.  12/20/17   Henreitta Leber, MD  insulin detemir (LEVEMIR) 100 UNIT/ML injection Inject 0.4 mLs (40 Units total) into the skin 2 (two) times daily. Patient taking differently: Inject 40 Units into the skin at bedtime.  12/20/17   Henreitta Leber, MD  isosorbide mononitrate (IMDUR) 30 MG 24 hr tablet Take 1 tablet (30 mg total) by mouth daily. 10/30/18   Vaughan Basta, MD  metoprolol succinate (TOPROL-XL) 100 MG 24 hr tablet Take 100 mg by mouth daily. 01/03/20   [provider]  Multiple Vitamin (MULTIVITAMIN WITH MINERALS) TABS tablet Take 1 tablet by mouth daily. 10/31/18  Vaughan Basta, MD  nitroGLYCERIN (NITROSTAT) 0.4 MG SL tablet Place 0.4 mg under the tongue every 5 (five) minutes as needed for chest pain.    [provider]  omeprazole (PRILOSEC) 20 MG capsule Take 20 mg by mouth daily. 05/28/18 05/28/19  [provider]  ondansetron (ZOFRAN) 4 MG tablet Take 4 mg by mouth every 12 (twelve) hours as needed for nausea. 01/04/20   [provider]  OZEMPIC, 0.25 OR 0.5 MG/DOSE, 2 MG/1.5ML SOPN Inject 0.5 mg into the skin every 7 (seven) days.  04/23/18   [provider]  QUEtiapine (SEROQUEL) 50 MG tablet Take 50 mg by mouth 2 (two) times daily as needed (headache).  03/18/18   [provider]  RENVELA 800 MG tablet Take 1,600 mg by mouth 3 (three) times daily with meals. 10/14/17   [provider]  VOLTAREN 1 % GEL Apply 2 g topically 3 (three) times daily as needed (left leg pain).  12/04/17   [provider]      VITAL SIGNS:  Blood pressure (!) 163/99, pulse 70, temperature 98.7 F (37.1 C), temperature source Oral, resp. rate 19, height 6\' 3"  (1.905 m),  weight (!) 164.5 kg, SpO2 94 %.  PHYSICAL EXAMINATION:  Physical Exam  GENERAL:  49 y.o.-year-old obese Caucasian male patient lying in the bed with no acute distress.  EYES: Pupils equal, round, reactive to light and accommodation. No scleral icterus. Extraocular muscles intact.  HEENT: Head atraumatic, normocephalic. Oropharynx and nasopharynx clear.  NECK:  Supple, no jugular venous distention. No thyroid enlargement, no tenderness.  LUNGS: Normal breath sounds bilaterally, no wheezing, rales,rhonchi or crepitation. No use of accessory muscles of respiration.  CARDIOVASCULAR: Regular rate and rhythm, S1, S2 normal. No murmurs, rubs, or gallops.  ABDOMEN: Soft, nondistended, nontender. Bowel sounds present. No organomegaly or mass.  EXTREMITIES: No pedal edema, cyanosis, or clubbing.  NEUROLOGIC: Cranial nerves II through XII are intact. Muscle strength 5/5 in all extremities. Sensation intact. Gait not checked.  PSYCHIATRIC: The patient is alert and oriented x 3.  Normal affect and good eye contact. SKIN: No obvious rash, lesion, or ulcer.   LABORATORY PANEL:   CBC Recent Labs  Lab 01/13/20 2150  WBC 7.7  HGB 10.8*  HCT 34.4*  PLT 215   ------------------------------------------------------------------------------------------------------------------  Chemistries  Recent Labs  Lab 01/13/20 2150  NA 136  K 4.0  CL 105  CO2 21*  GLUCOSE 340*  BUN 36*  CREATININE 5.55*  CALCIUM 7.9*   ------------------------------------------------------------------------------------------------------------------  Cardiac Enzymes No results for input(s): TROPONINI in the last 168 hours. ------------------------------------------------------------------------------------------------------------------  RADIOLOGY:  DG Chest Portable 1 View  Result Date: 01/13/2020 CLINICAL DATA:  Chest pain EXAM: PORTABLE CHEST 1 VIEW COMPARISON:  October 28, 2018 FINDINGS: The heart size and  mediastinal contours are unchanged with mild cardiomegaly. A left-sided ICD is noted. A right-sided central venous catheter seen with the tip in the right atrium. There is mild prominence of the central pulmonary vasculature. No large airspace consolidation or pleural effusion. Healed left mid rib fractures are seen. IMPRESSION: Stable cardiomegaly and mild pulmonary vascular congestion. Electronically Signed   By: Prudencio Pair M.D.   On: 01/13/2020 22:56      IMPRESSION AND PLAN:   1.Chest pain, rule out acute coronary syndrome.   -The patient will be admitted to an observation telemetry bed.   -Will follow serial cardiac enzymes and EKGs. - We will obtain a cardiology consult in a.m. for further cardiac risk stratification.   -  The patient will be placed on aspirin as well as p.r.n. sublingual nitroglycerin and morphine sulfate for pain. -Cardiology consultation will be obtained. -I notified Dr. Ubaldo Glassing about the patient  2.  Syncope. - Will check orthostatics q 12 hours.   -Will obtain a bilateral carotid Doppler and 2D echo.   -His AICD is being interrogated. -The patient will be gently hydrated with IV normal saline and monitored for arrhythmias. Differential diagnosis would include neurally mediated syncope, cardiogenic, arrhythmias related,  orthostatic hypotension and less likely hypoglycemia.  3.  Hypertensive urgency. -We will continue his antihypertensives and place him on as needed IV labetalol.  4.  Paroxysmal atrial fibrillation. -We will continue his amiodarone and Eliquis.  5.  Gout. -We will continue his allopurinol.  6.  Dyslipidemia. -We will continue statin therapy.  7.  Type II diabetes mellitus. -We will place him on supplemental coverage with NovoLog.  8.  DVT prophylaxis. -Subcutaneous Lovenox.   All the records are reviewed and case discussed with ED provider. The plan of care was discussed in details with the patient (and family). I answered all  questions. The patient agreed to proceed with the above mentioned plan. Further management will depend upon hospital course.   CODE STATUS: Full code  Status is: Observation  The patient remains OBS appropriate and will d/c before 2 midnights.  Dispo: The patient is from: Home              Anticipated d/c is to: Home              Anticipated d/c date is: 1 day              Patient currently is not medically stable to d/c.    TOTAL TIME TAKING CARE OF THIS PATIENT:55 minutes.    Christel Mormon M.D on 01/14/2020 at 1:33 AM  [facility]   From 7 PM-7 AM, contact night-coverage [email or web address]  CC: Primary care physician; Caryl Never, MD

## 2020-01-14 NOTE — Progress Notes (Signed)
PROGRESS NOTE    Edwin [name]  HGD:[id number] DOB: 1971/02/28 DOA: 01/13/2020 PCP: Caryl Never, MD  Outpatient Specialists: [facility]    Brief Narrative:   Edwin [name]  is a 49 y.o. obese Caucasian male with a known history of coronary artery disease, systolic CHF, type II diabetes mellitus, hypertension and dyslipidemia as well as paroxysmal atrial fibrillation, who presented to the emergency room with acute onset of left-sided chest pain felt as stabbing pain that was moderate severe intensity with associated nausea without vomiting or diaphoresis.  This occurred while he was doing [place] hunting and walking uphill.  He then got into his car and had a syncopal episode for a couple minutes per his wife who got him out of the car and laid him on the gravel.  His blood pressure was elevated 233/150 per his report.  Upon arrival of EMS, he was given sublingual nitroglycerin.  He admitted to radiation of his chest pain to his left neck.  No cough or wheezing hemoptysis or leg pain or worsening edema or recent travels or surgeries.  No headache or dizziness or blurred vision.  No dysuria, oliguria or hematuria or flank pain.  Upon presentation to the emergency room, vital signs were within normal.  Labs revealed hyperglycemia of 340 and a BUN of 36 with creatinine 5.55, high-sensitivity troponin I of 56 and later 73 and CBC showing anemia that is stable.  Portable chest ray showed  stable cardiomegaly with mild pulmonary vascular congestion. EKG showed normal sinus rhythm with rate of 85 with T wave inversion laterally, QT prolongation with QTC of 512 MS and poor R wave progression.  The patient was given 4 aspirin.  He will be admitted to an observation progressive unit bed for evaluation and management.   Assessment & Plan:   Active Problems:   Chest pain  Chest pain, rule out acute coronary syndrome.  Hx multi-vessel CAD s/p multiple stents, followed by  [facility]. Troponins mildy elevated here 50s to 70s. Currently chest pain free. EKG with some lateral TWIs stable from yesterday. - cont tele - f/u cardiology recs - cont home nitroglycerin, brilinta  Syncope. - f/u orthostats - echo ordered - His AICD is being interrogated. - don't think we need carotid dopplers, will d/c - cardiology w/u as above  HFrEF - ef on 2021 tte 35%, also grade 3 dd. Appears more or less euvolemic on exam - dialysis today as above - cont home bumex, imdur, metoprolol  Hypertensive urgency. Chronic hypertension Improved this morning w/ resumption of home meds - cont home amlodipine, metoprolol, imdur  Paroxysmal atrial fibrillation. Rate controlled -We will continue his amiodarone, metoprolol, and Eliquis.  ESRD On m/w/f dialysis, missed dialysis yesterday. Does not appear fluid overloaded, k is normal - nephrology consulted for dialysis today  Gout. -We will continue his allopurinol.  Dyslipidemia. -We will continue statin therapy and zetia  Type II diabetes mellitus. -We will place him on supplemental coverage with NovoLog and continue home levemir  OSA - need to assess if on cpap at home, if so will order    DVT prophylaxis: home apixaban Code Status: full Family Communication: none at bedside  Status is: Observation  The patient remains OBS appropriate and will d/c before 2 midnights.  Dispo: The patient is from: Home              Anticipated d/c is to: Home  Anticipated d/c date is: 1 day              Patient currently is not medically stable to d/c.        Consultants:  cardiology  Procedures: none  Antimicrobials:  none    Subjective: This morning feeling normally. No chest pain or lightheadedness. Hungry. No vomiting or diarrhea.  Objective: Vitals:   01/14/20 0100 01/14/20 0157 01/14/20 0317 01/14/20 0746  BP: (!) 163/99 (!) 184/98 (!) 177/92 (!) 156/83  Pulse: 70 78 70 70  Resp:  19 19 18  (!) 21  Temp:  97.9 F (36.6 C) (!) 97.5 F (36.4 C) 97.7 F (36.5 C)  TempSrc:  Oral Oral Oral  SpO2: 94% 99%  99%  Weight:  (!) 160.8 kg    Height:  6\' 2"  (1.88 m)      Intake/Output Summary (Last 24 hours) at 01/14/2020 0851 Last data filed at 01/14/2020 0600 Gross per 24 hour  Intake 347.63 ml  Output 600 ml  Net -252.37 ml   Filed Weights   01/13/20 2146 01/14/20 0157  Weight: (!) 164.5 kg (!) 160.8 kg    Examination:  General exam: chronically ill appearing, NAD  Respiratory system: Clear to auscultation. Respiratory effort normal. Cardiovascular system: rrr, systolic murmur Gastrointestinal system: Abdomen is obese, soft and nontender. No organomegaly or masses felt. Normal bowel sounds heard. Central nervous system: Alert and oriented. No focal neurological deficits. Extremities: Symmetric 5 x 5 power. Skin: No rashes, lesions or ulcers. 1 + LE edema Psychiatry: Judgement and insight appear normal. Mood & affect appropriate.     Data Reviewed: I have personally reviewed following labs and imaging studies  CBC: Recent Labs  Lab 01/13/20 2150  WBC 7.7  NEUTROABS 5.9  HGB 10.8*  HCT 34.4*  MCV 86.0  PLT 161   Basic Metabolic Panel: Recent Labs  Lab 01/13/20 2150  NA 136  K 4.0  CL 105  CO2 21*  GLUCOSE 340*  BUN 36*  CREATININE 5.55*  CALCIUM 7.9*   GFR: Estimated Creatinine Clearance: 25.3 mL/min (A) (by C-G formula based on SCr of 5.55 mg/dL (H)). Liver Function Tests: No results for input(s): AST, ALT, ALKPHOS, BILITOT, PROT, ALBUMIN in the last 168 hours. No results for input(s): LIPASE, AMYLASE in the last 168 hours. No results for input(s): AMMONIA in the last 168 hours. Coagulation Profile: No results for input(s): INR, PROTIME in the last 168 hours. Cardiac Enzymes: No results for input(s): CKTOTAL, CKMB, CKMBINDEX, TROPONINI in the last 168 hours. BNP (last 3 results) No results for input(s): PROBNP in the last 8760  hours. HbA1C: No results for input(s): HGBA1C in the last 72 hours. CBG: Recent Labs  Lab 01/14/20 0207 01/14/20 0750  GLUCAP 188* 171*   Lipid Profile: Recent Labs    01/14/20 0235  CHOL 196  HDL 36*  LDLCALC 131*  TRIG 143  CHOLHDL 5.4   Thyroid Function Tests: No results for input(s): TSH, T4TOTAL, FREET4, T3FREE, THYROIDAB in the last 72 hours. Anemia Panel: No results for input(s): VITAMINB12, FOLATE, FERRITIN, TIBC, IRON, RETICCTPCT in the last 72 hours. Urine analysis:    Component Value Date/Time   COLORURINE STRAW (A) 10/28/2018 2145   APPEARANCEUR CLEAR (A) 10/28/2018 2145   LABSPEC 1.008 10/28/2018 2145   PHURINE 5.0 10/28/2018 2145   GLUCOSEU 50 (A) 10/28/2018 2145   HGBUR SMALL (A) 10/28/2018 2145   BILIRUBINUR NEGATIVE 10/28/2018 2145   KETONESUR NEGATIVE 10/28/2018 2145   PROTEINUR 100 (  A) 10/28/2018 2145   NITRITE NEGATIVE 10/28/2018 2145   LEUKOCYTESUR NEGATIVE 10/28/2018 2145   Sepsis Labs: @LABRCNTIP (procalcitonin:4,lacticidven:4)  ) Recent Results (from the past 240 hour(s))  Respiratory Panel by RT PCR (Flu A&B, Covid) - Nasopharyngeal Swab     Status: None   Collection Time: 01/14/20 12:24 AM   Specimen: Nasopharyngeal Swab  Result Value Ref Range Status   SARS Coronavirus 2 by RT PCR NEGATIVE NEGATIVE Final    Comment: (NOTE) SARS-CoV-2 target nucleic acids are NOT DETECTED.  The SARS-CoV-2 RNA is generally detectable in upper respiratoy specimens during the acute phase of infection. The lowest concentration of SARS-CoV-2 viral copies this assay can detect is 131 copies/mL. A negative result does not preclude SARS-Cov-2 infection and should not be used as the sole basis for treatment or other patient management decisions. A negative result Cryder occur with  improper specimen collection/handling, submission of specimen other than nasopharyngeal swab, presence of viral mutation(s) within the areas targeted by this assay, and inadequate  number of viral copies (<131 copies/mL). A negative result must be combined with clinical observations, patient history, and epidemiological information. The expected result is Negative.  Fact Sheet for Patients:  PinkCheek.be  Fact Sheet for Healthcare Providers:  GravelBags.it  This test is no t yet approved or cleared by the [place] FDA and  has been authorized for detection and/or diagnosis of SARS-CoV-2 by FDA under an Emergency Use Authorization (EUA). This EUA will remain  in effect (meaning this test can be used) for the duration of the COVID-19 declaration under Section 564(b)(1) of the Act, 21 U.S.C. section 360bbb-3(b)(1), unless the authorization is terminated or revoked sooner.     Influenza A by PCR NEGATIVE NEGATIVE Final   Influenza B by PCR NEGATIVE NEGATIVE Final    Comment: (NOTE) The Xpert Xpress SARS-CoV-2/FLU/RSV assay is intended as an aid in  the diagnosis of influenza from Nasopharyngeal swab specimens and  should not be used as a sole basis for treatment. Nasal washings and  aspirates are unacceptable for Xpert Xpress SARS-CoV-2/FLU/RSV  testing.  Fact Sheet for Patients: PinkCheek.be  Fact Sheet for Healthcare Providers: GravelBags.it  This test is not yet approved or cleared by the [place] FDA and  has been authorized for detection and/or diagnosis of SARS-CoV-2 by  FDA under an Emergency Use Authorization (EUA). This EUA will remain  in effect (meaning this test can be used) for the duration of the  Covid-19 declaration under Section 564(b)(1) of the Act, 21  U.S.C. section 360bbb-3(b)(1), unless the authorization is  terminated or revoked. Performed at [facility], [address], [place], [place] [address]          Radiology Studies: DG Chest Portable 1 View  Result Date: 01/13/2020 CLINICAL DATA:   Chest pain EXAM: PORTABLE CHEST 1 VIEW COMPARISON:  October 28, 2018 FINDINGS: The heart size and mediastinal contours are unchanged with mild cardiomegaly. A left-sided ICD is noted. A right-sided central venous catheter seen with the tip in the right atrium. There is mild prominence of the central pulmonary vasculature. No large airspace consolidation or pleural effusion. Healed left mid rib fractures are seen. IMPRESSION: Stable cardiomegaly and mild pulmonary vascular congestion. Electronically Signed   By: Prudencio Pair M.D.   On: 01/13/2020 22:56        Scheduled Meds: . allopurinol  50 mg Oral QODAY  . amiodarone  200 mg Oral Daily  . amLODipine  10 mg Oral Daily  .  apixaban  2.5 mg Oral BID  . aspirin EC  81 mg Oral Daily  . atorvastatin  80 mg Oral q1800  . bumetanide  2 mg Oral BID  . carvedilol  25 mg Oral Daily  . carvedilol  50 mg Oral q1800  . cholecalciferol  5,000 Units Oral Daily  . ezetimibe  10 mg Oral Daily  . FLUoxetine  60 mg Oral Daily  . gabapentin  300 mg Oral BID  . insulin aspart  0-15 Units Subcutaneous TID PC & HS  . insulin aspart  20 Units Subcutaneous TID WC  . insulin detemir  40 Units Subcutaneous Daily  . isosorbide mononitrate  30 mg Oral Daily  . multivitamin with minerals  1 tablet Oral Daily  . pantoprazole  40 mg Oral Daily  . Semaglutide(0.25 or 0.5MG /DOS)  0.5 mg Subcutaneous Q7 days  . sevelamer carbonate  1,600 mg Oral TID WC  . ticagrelor  90 mg Oral BID   Continuous Infusions:   LOS: 0 days    Time spent: 20 min    Desma Maxim, MD [facility]   If 7PM-7AM, please contact night-coverage [email or web address] Password [email or web address] 01/14/2020, 8:51 AM

## 2020-01-14 NOTE — Plan of Care (Signed)
Discussed with patient plan of care for the evening, pain management and admission questions with some teach back displayed

## 2020-01-14 NOTE — ED Notes (Signed)
RN attempted to interrogate pt implanted [employer] device. Numerous attempts made without success. RN troubleshot the machine and called support number. RN remains unable to interrogate device. MD and charge RN made aware. RN attempting to locate another interrogation device from another unit.

## 2020-01-14 NOTE — Discharge Summary (Signed)
Edwin [name] EBR:[id number] DOB: 1970/10/13 DOA: 01/13/2020  PCP: Caryl Never, MD  Admit date: 01/13/2020 Discharge date: 01/14/2020  Time spent: 25 minutes  Recommendations for Outpatient Follow-up:  1. Will need close f/u with primary cardiologist and electrophysiologist     Discharge Diagnoses:  Chest pain Syncope Breakthrough atrial fibrillation with rvr htn esrd on dialysis t2dm hfref CAD OSA  Discharge Condition: stable  Diet recommendation: heart healthy   Filed Weights   01/13/20 2146 01/14/20 0157  Weight: (!) 164.5 kg (!) 160.8 kg    History of present illness:  Edwin [name] a49 y.o.obese Caucasian malewith a known history ofcoronary artery disease, systolic CHF, type II diabetes mellitus, hypertension and dyslipidemia as well as paroxysmal atrial fibrillation, who presented to the emergency room with acute onset of left-sided chest pain felt as stabbing pain that was moderate severe intensity with associated nausea without vomiting or diaphoresis. This occurred while he was doing [place] hunting and walking uphill. He then got into his car and had a syncopal episode for a couple minutes per his wife who got him out of the car and laid him on the gravel. His blood pressure was elevated 233/150 per his report. Upon arrival of EMS, he was given sublingual nitroglycerin. He admitted to radiation of his chest pain to his left neck. No cough or wheezing hemoptysis or leg pain or worsening edema or recent travels or surgeries. No headache or dizziness or blurred vision. No dysuria, oliguria or hematuria or flank pain.  Upon presentation to the emergency room, vital signs were within normal. Labs revealed hyperglycemia of 340 and a BUN of 36 with creatinine 5.55, high-sensitivity troponin I of 56 and later 73 and CBC showing anemia that is stable. Portable chest ray showed stable cardiomegaly with mild pulmonary vascular congestion. EKG showed normal  sinus rhythm with rate of 85 with T wave inversion laterally, QT prolongation with QTC of 512 MS and poor R wave progression.  The patient was given 4 aspirin. He will be admitted to an observation progressive unit bed for evaluation and management.  Hospital Course:  Chest pain, rule out acute coronary syndrome. Hx multi-vessel CAD s/p multiple stents, followed by [facility]. Troponins mildy elevated here 50s to 70s. Currently chest pain free. EKG with some lateral TWIs stable from yesterday. Thought to be demand 2/2 breakthrough a fib with rvr and also esrd  Syncope. Pacemaker interrogated by cardiology, syncope thought to be breakthrough a fib with rvr.  - amiodarone increased to 200 bid, will need close outpt f/u with cardiology and electrophysiology. Cardiology has messaged patient's primary cardiologist  HFrEF - ef on 2021 tte 35%, also grade 3 dd. Appears more or less euvolemic on exam - continued home meds  Hypertensive urgency. Chronic hypertension Improved this morning w/ resumption of home meds - cont home amlodipine, metoprolol, imdur  Paroxysmal atrial fibrillation. Rate controlled -home meds continued, amiodarone increased  ESRD On m/w/f dialysis, missed dialysis yesterday. Does not appear fluid overloaded, k is normal - pt promises he will present for dialysis on monday   Type II diabetes mellitus. -no sig hyperglycemia    Procedures:  none   Consultations:  cardiology  Discharge Exam: Vitals:   01/14/20 1147 01/14/20 1149  BP: 131/74 123/71  Pulse: 70 77  Resp:    Temp:    SpO2: 95% 94%    General exam: chronically ill appearing, NAD  Respiratory system: Clear to auscultation. Respiratory effort normal. Cardiovascular system: rrr, systolic murmur Gastrointestinal  system: Abdomen is obese, soft and nontender. No organomegaly or masses felt. Normal bowel sounds heard. Central nervous system: Alert and oriented. No focal neurological  deficits. Extremities: Symmetric 5 x 5 power. Skin: No rashes, lesions or ulcers. 1 + LE edema Psychiatry: Judgement and insight appear normal. Mood & affect appropriate.   Discharge Instructions   Discharge Instructions    Call MD for:  difficulty breathing, headache or visual disturbances   Complete by: As directed    Call MD for:  extreme fatigue   Complete by: As directed    Call MD for:  persistant dizziness or light-headedness   Complete by: As directed    Call MD for:  persistant nausea and vomiting   Complete by: As directed    Call MD for:  redness, tenderness, or signs of infection (pain, swelling, redness, odor or green/yellow discharge around incision site)   Complete by: As directed    Call MD for:  severe uncontrolled pain   Complete by: As directed    Call MD for:  temperature >100.4   Complete by: As directed    Diet - low sodium heart healthy   Complete by: As directed    Increase activity slowly   Complete by: As directed      Allergies as of 01/14/2020      Reactions   Losartan    Hyperkalemia (K>6)   Morphine And Related Other (See Comments)   Overdose in hospital per patient report      Medication List    TAKE these medications   albuterol 108 (90 Base) MCG/ACT inhaler Commonly known as: VENTOLIN HFA Inhale 2 puffs into the lungs every 6 (six) hours as needed for wheezing or shortness of breath.   Alirocumab 150 MG/ML Soaj Inject 150 mg into the skin. Every 14 days   allopurinol 100 MG tablet Commonly known as: ZYLOPRIM Take 50 mg by mouth every other day.   amiodarone 200 MG tablet Commonly known as: PACERONE Take 1 tablet (200 mg total) by mouth 2 (two) times daily. What changed: when to take this   amLODipine 10 MG tablet Commonly known as: NORVASC Take 10 mg by mouth daily.   apixaban 2.5 MG Tabs tablet Commonly known as: ELIQUIS Take 1 tablet (2.5 mg total) by mouth 2 (two) times daily.   Eliquis 5 MG Tabs tablet Generic drug:  apixaban Take 5 mg by mouth 2 (two) times daily.   atorvastatin 80 MG tablet Commonly known as: LIPITOR Take 80 mg by mouth every evening.   Brilinta 90 MG Tabs tablet Generic drug: ticagrelor Take 90 mg by mouth 2 (two) times daily.   bumetanide 2 MG tablet Commonly known as: BUMEX Take 2 mg by mouth 2 (two) times daily.   carvedilol 3.125 MG tablet Commonly known as: COREG Take 1 tablet (3.125 mg total) by mouth See admin instructions. Take 1 tablet (25MG ) by mouth every morning and 2 tablets (50MG ) by mouth every night   cholecalciferol 25 MCG (1000 UNIT) tablet Commonly known as: VITAMIN D Take 5,000 Units by mouth daily.   ezetimibe 10 MG tablet Commonly known as: ZETIA Take 10 mg by mouth daily.   FLUoxetine 20 MG capsule Commonly known as: PROZAC Take 60 mg by mouth at bedtime.   gabapentin 300 MG capsule Commonly known as: NEURONTIN Take 300 mg by mouth 2 (two) times a day.   hydrOXYzine 10 MG tablet Commonly known as: ATARAX/VISTARIL Take 10 mg by mouth at bedtime.  insulin aspart 100 UNIT/ML injection Commonly known as: novoLOG Inject 15 Units into the skin 3 (three) times daily with meals. What changed: how much to take   insulin detemir 100 UNIT/ML injection Commonly known as: LEVEMIR Inject 0.4 mLs (40 Units total) into the skin 2 (two) times daily. What changed: when to take this   isosorbide mononitrate 30 MG 24 hr tablet Commonly known as: IMDUR Take 1 tablet (30 mg total) by mouth daily.   metoprolol succinate 100 MG 24 hr tablet Commonly known as: TOPROL-XL Take 100 mg by mouth daily.   multivitamin with minerals Tabs tablet Take 1 tablet by mouth daily.   nitroGLYCERIN 0.4 MG SL tablet Commonly known as: NITROSTAT Place 0.4 mg under the tongue every 5 (five) minutes as needed for chest pain.   omeprazole 20 MG capsule Commonly known as: PRILOSEC Take 20 mg by mouth daily.   ondansetron 4 MG tablet Commonly known as: ZOFRAN Take  4 mg by mouth every 12 (twelve) hours as needed for nausea.   Ozempic (0.25 or 0.5 MG/DOSE) 2 MG/1.5ML Sopn Generic drug: Semaglutide(0.25 or 0.5MG /DOS) Inject 0.5 mg into the skin every 7 (seven) days.   QUEtiapine 50 MG tablet Commonly known as: SEROQUEL Take 50 mg by mouth 2 (two) times daily as needed (headache).   Renvela 800 MG tablet Generic drug: sevelamer carbonate Take 1,600 mg by mouth 3 (three) times daily with meals.   Voltaren 1 % Gel Generic drug: diclofenac Sodium Apply 2 g topically 3 (three) times daily as needed (left leg pain).      Allergies  Allergen Reactions  . Losartan     Hyperkalemia (K>6)  . Morphine And Related Other (See Comments)    Overdose in hospital per patient report    Follow-up Information    Your cardiologist. Schedule an appointment as soon as possible for a visit.                The results of significant diagnostics from this hospitalization (including imaging, microbiology, ancillary and laboratory) are listed below for reference.    Significant Diagnostic Studies: DG Chest Portable 1 View  Result Date: 01/13/2020 CLINICAL DATA:  Chest pain EXAM: PORTABLE CHEST 1 VIEW COMPARISON:  October 28, 2018 FINDINGS: The heart size and mediastinal contours are unchanged with mild cardiomegaly. A left-sided ICD is noted. A right-sided central venous catheter seen with the tip in the right atrium. There is mild prominence of the central pulmonary vasculature. No large airspace consolidation or pleural effusion. Healed left mid rib fractures are seen. IMPRESSION: Stable cardiomegaly and mild pulmonary vascular congestion. Electronically Signed   By: Prudencio Pair M.D.   On: 01/13/2020 22:56    Microbiology: Recent Results (from the past 240 hour(s))  Respiratory Panel by RT PCR (Flu A&B, Covid) - Nasopharyngeal Swab     Status: None   Collection Time: 01/14/20 12:24 AM   Specimen: Nasopharyngeal Swab  Result Value Ref Range Status   SARS  Coronavirus 2 by RT PCR NEGATIVE NEGATIVE Final    Comment: (NOTE) SARS-CoV-2 target nucleic acids are NOT DETECTED.  The SARS-CoV-2 RNA is generally detectable in upper respiratoy specimens during the acute phase of infection. The lowest concentration of SARS-CoV-2 viral copies this assay can detect is 131 copies/mL. A negative result does not preclude SARS-Cov-2 infection and should not be used as the sole basis for treatment or other patient management decisions. A negative result Venuto occur with  improper specimen collection/handling, submission of  specimen other than nasopharyngeal swab, presence of viral mutation(s) within the areas targeted by this assay, and inadequate number of viral copies (<131 copies/mL). A negative result must be combined with clinical observations, patient history, and epidemiological information. The expected result is Negative.  Fact Sheet for Patients:  PinkCheek.be  Fact Sheet for Healthcare Providers:  GravelBags.it  This test is no t yet approved or cleared by the [place] FDA and  has been authorized for detection and/or diagnosis of SARS-CoV-2 by FDA under an Emergency Use Authorization (EUA). This EUA will remain  in effect (meaning this test can be used) for the duration of the COVID-19 declaration under Section 564(b)(1) of the Act, 21 U.S.C. section 360bbb-3(b)(1), unless the authorization is terminated or revoked sooner.     Influenza A by PCR NEGATIVE NEGATIVE Final   Influenza B by PCR NEGATIVE NEGATIVE Final    Comment: (NOTE) The Xpert Xpress SARS-CoV-2/FLU/RSV assay is intended as an aid in  the diagnosis of influenza from Nasopharyngeal swab specimens and  should not be used as a sole basis for treatment. Nasal washings and  aspirates are unacceptable for Xpert Xpress SARS-CoV-2/FLU/RSV  testing.  Fact Sheet for  Patients: PinkCheek.be  Fact Sheet for Healthcare Providers: GravelBags.it  This test is not yet approved or cleared by the [place] FDA and  has been authorized for detection and/or diagnosis of SARS-CoV-2 by  FDA under an Emergency Use Authorization (EUA). This EUA will remain  in effect (meaning this test can be used) for the duration of the  Covid-19 declaration under Section 564(b)(1) of the Act, 21  U.S.C. section 360bbb-3(b)(1), unless the authorization is  terminated or revoked. Performed at [facility], [address] [place], [place] [address]      Labs: Basic Metabolic Panel: Recent Labs  Lab 01/13/20 2150  NA 136  K 4.0  CL 105  CO2 21*  GLUCOSE 340*  BUN 36*  CREATININE 5.55*  CALCIUM 7.9*   Liver Function Tests: No results for input(s): AST, ALT, ALKPHOS, BILITOT, PROT, ALBUMIN in the last 168 hours. No results for input(s): LIPASE, AMYLASE in the last 168 hours. No results for input(s): AMMONIA in the last 168 hours. CBC: Recent Labs  Lab 01/13/20 2150  WBC 7.7  NEUTROABS 5.9  HGB 10.8*  HCT 34.4*  MCV 86.0  PLT 215   Cardiac Enzymes: No results for input(s): CKTOTAL, CKMB, CKMBINDEX, TROPONINI in the last 168 hours. BNP: BNP (last 3 results) No results for input(s): BNP in the last 8760 hours.  ProBNP (last 3 results) No results for input(s): PROBNP in the last 8760 hours.  CBG: Recent Labs  Lab 01/14/20 0207 01/14/20 0750 01/14/20 1142  GLUCAP 188* 171* 131*       Signed:  Desma Maxim MD.  [facility] 01/14/2020, 1:00 PM

## 2020-01-14 NOTE — Unmapped (Signed)
[facility] Cardiology Transfer Center Request Note    12:12 AM  01/14/20    Requesting Physician: Dr. 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    Per report, the patient was out with friends at a [place] shoot this afternoon, and noted some chest pain while walking up a hill. Patient was able to make it back to his vehicle with his wife, but then reportedly lost consciousness. Per report, wife feels that he was unconscious for about 2 minutes, and notes that he was diaphoretic. EMS gave SLN, and the patient was fully recovered and asymptomatic by the time of his arrival to the ED. Significant work up includes hs troponin 56, EKG without ischemic findings, and normal CXR. Unable to interrogate device at this time. ED provider would like to transfer the patient for overnight observation, which is reasonable. However, given that we currently have no floor or step down beds and would likely not be able to transfer the patient tonight, provider stated that he would instead try to admit the patient to his facility for observation. He will call back to initiate transfer if further needs arise.    Cheryll Dessert, MD, MPH  Fellow, Cardiovascular Disease  [facility]

## 2020-01-15 LAB — ECHOCARDIOGRAM COMPLETE
Area-P 1/2: 3.77 cm2
Height: 74 in
S' Lateral: 4.38 cm
Weight: 5673.6 oz

## 2020-01-16 ENCOUNTER — Ambulatory Visit: Admit: 2020-01-16 | Payer: MEDICARE

## 2020-01-16 LAB — HEMOGLOBIN A1C
Hgb A1c MFr Bld: 8.1 % — ABNORMAL HIGH (ref 4.8–5.6)
Mean Plasma Glucose: 186 mg/dL

## 2020-01-16 NOTE — Unmapped (Signed)
Mr. Reinard did not show to his 10.18.21 rheumatoid appointment.     Janae Bridgeman, MD  [facility] Rheumatology   Pager [phone]  01/16/2020

## 2020-01-17 DIAGNOSIS — I25118 Atherosclerotic heart disease of native coronary artery with other forms of angina pectoris: Principal | ICD-10-CM

## 2020-01-17 MED ORDER — NITROGLYCERIN 0.4 MG SUBLINGUAL TABLET
SUBLINGUAL | 3 refills | 1 days | PRN
Start: 2020-01-17 — End: 2021-01-16

## 2020-01-17 NOTE — Unmapped (Signed)
Pharmacy request:  Pharmacy comment: Hello, patient would like to try the sublingual spray. If authorized, would you send new prescription for [place] to try and run through insurance to see if it is covered. Thanks!    Please order if apprropriate

## 2020-01-18 NOTE — Unmapped (Signed)
[facility] Refill Coordination Note    Specialty Medication(s) to be Shipped:   General Specialty: Praluent    Other medication(s) to be shipped: No additional medications requested for fill at this time     Jeremy Oliver, DOB: 1970-05-29  Phone: [phone] (home)       All above HIPAA information was verified with patient.     Was a Nurse, learning disability used for this call? No    Completed refill call assessment today to schedule patient's medication shipment from the [facility] [phone]).       Specialty medication(s) and dose(s) confirmed: Regimen is correct and unchanged.   Changes to medications: [name] reports no changes at this time.  Changes to insurance: No  Questions for the pharmacist: No    Confirmed patient received Welcome Packet with first shipment. The patient will receive a drug information handout for each medication shipped and additional FDA Medication Guides as required.       DISEASE/MEDICATION-SPECIFIC INFORMATION        For patients on injectable medications: Patient currently has 1 doses left.  Next injection is scheduled for 01/21/20.    SPECIALTY MEDICATION ADHERENCE     Medication Adherence    Patient reported X missed doses in the last month: 0  Specialty Medication: Praluent 150mg /ml  Patient is on additional specialty medications: No                Praluent 150 mg/ml: 3 days of medicine on hand         SHIPPING     Shipping address confirmed in Epic.     Delivery Scheduled: Yes, Expected medication delivery date: 01/31/20.     Medication will be delivered via [facility]  clinic to the prescription address in Epic WAM.    [name]   [facility] Specialty Technician

## 2020-01-23 ENCOUNTER — Emergency Department: Payer: Medicare Other

## 2020-01-23 ENCOUNTER — Inpatient Hospital Stay
Admission: EM | Admit: 2020-01-23 | Discharge: 2020-01-25 | DRG: 280 | Disposition: A | Discharge status: Other Acute Inpatient Hospital | Payer: Medicare Other | Attending: Internal Medicine | Admitting: Internal Medicine

## 2020-01-23 ENCOUNTER — Other Ambulatory Visit: Payer: Self-pay

## 2020-01-23 DIAGNOSIS — I5022 Chronic systolic (congestive) heart failure: Secondary | ICD-10-CM

## 2020-01-23 DIAGNOSIS — E66813 Obesity, class 3: Secondary | ICD-10-CM

## 2020-01-23 DIAGNOSIS — Z6841 Body Mass Index (BMI) 40.0 and over, adult: Secondary | ICD-10-CM

## 2020-01-23 DIAGNOSIS — Z885 Allergy status to narcotic agent status: Secondary | ICD-10-CM

## 2020-01-23 DIAGNOSIS — I214 Non-ST elevation (NSTEMI) myocardial infarction: Secondary | ICD-10-CM | POA: Diagnosis present

## 2020-01-23 DIAGNOSIS — N2581 Secondary hyperparathyroidism of renal origin: Secondary | ICD-10-CM | POA: Diagnosis present

## 2020-01-23 DIAGNOSIS — Z8674 Personal history of sudden cardiac arrest: Secondary | ICD-10-CM

## 2020-01-23 DIAGNOSIS — Z833 Family history of diabetes mellitus: Secondary | ICD-10-CM

## 2020-01-23 DIAGNOSIS — Z992 Dependence on renal dialysis: Secondary | ICD-10-CM | POA: Diagnosis not present

## 2020-01-23 DIAGNOSIS — E1122 Type 2 diabetes mellitus with diabetic chronic kidney disease: Secondary | ICD-10-CM | POA: Diagnosis present

## 2020-01-23 DIAGNOSIS — Z8249 Family history of ischemic heart disease and other diseases of the circulatory system: Secondary | ICD-10-CM | POA: Diagnosis not present

## 2020-01-23 DIAGNOSIS — I255 Ischemic cardiomyopathy: Secondary | ICD-10-CM | POA: Diagnosis present

## 2020-01-23 DIAGNOSIS — R55 Syncope and collapse: Secondary | ICD-10-CM | POA: Diagnosis present

## 2020-01-23 DIAGNOSIS — I480 Paroxysmal atrial fibrillation: Secondary | ICD-10-CM | POA: Diagnosis present

## 2020-01-23 DIAGNOSIS — E785 Hyperlipidemia, unspecified: Secondary | ICD-10-CM | POA: Diagnosis present

## 2020-01-23 DIAGNOSIS — Z888 Allergy status to other drugs, medicaments and biological substances status: Secondary | ICD-10-CM | POA: Diagnosis not present

## 2020-01-23 DIAGNOSIS — I132 Hypertensive heart and chronic kidney disease with heart failure and with stage 5 chronic kidney disease, or end stage renal disease: Secondary | ICD-10-CM | POA: Diagnosis present

## 2020-01-23 DIAGNOSIS — E119 Type 2 diabetes mellitus without complications: Secondary | ICD-10-CM

## 2020-01-23 DIAGNOSIS — I10 Essential (primary) hypertension: Secondary | ICD-10-CM | POA: Diagnosis present

## 2020-01-23 DIAGNOSIS — R079 Chest pain, unspecified: Secondary | ICD-10-CM | POA: Diagnosis present

## 2020-01-23 DIAGNOSIS — Z95810 Presence of automatic (implantable) cardiac defibrillator: Secondary | ICD-10-CM

## 2020-01-23 DIAGNOSIS — Z825 Family history of asthma and other chronic lower respiratory diseases: Secondary | ICD-10-CM

## 2020-01-23 DIAGNOSIS — I4892 Unspecified atrial flutter: Secondary | ICD-10-CM | POA: Diagnosis present

## 2020-01-23 DIAGNOSIS — I4891 Unspecified atrial fibrillation: Secondary | ICD-10-CM

## 2020-01-23 DIAGNOSIS — I420 Dilated cardiomyopathy: Secondary | ICD-10-CM | POA: Diagnosis present

## 2020-01-23 DIAGNOSIS — N186 End stage renal disease: Secondary | ICD-10-CM | POA: Diagnosis present

## 2020-01-23 DIAGNOSIS — I200 Unstable angina: Secondary | ICD-10-CM

## 2020-01-23 DIAGNOSIS — I25110 Atherosclerotic heart disease of native coronary artery with unstable angina pectoris: Secondary | ICD-10-CM | POA: Diagnosis present

## 2020-01-23 DIAGNOSIS — I472 Ventricular tachycardia: Secondary | ICD-10-CM | POA: Diagnosis present

## 2020-01-23 DIAGNOSIS — Z79899 Other long term (current) drug therapy: Secondary | ICD-10-CM

## 2020-01-23 DIAGNOSIS — E1165 Type 2 diabetes mellitus with hyperglycemia: Secondary | ICD-10-CM | POA: Diagnosis present

## 2020-01-23 DIAGNOSIS — Z96652 Presence of left artificial knee joint: Secondary | ICD-10-CM | POA: Diagnosis present

## 2020-01-23 DIAGNOSIS — I2584 Coronary atherosclerosis due to calcified coronary lesion: Secondary | ICD-10-CM | POA: Diagnosis present

## 2020-01-23 DIAGNOSIS — Z7902 Long term (current) use of antithrombotics/antiplatelets: Secondary | ICD-10-CM

## 2020-01-23 DIAGNOSIS — I2510 Atherosclerotic heart disease of native coronary artery without angina pectoris: Secondary | ICD-10-CM | POA: Diagnosis present

## 2020-01-23 DIAGNOSIS — Z20822 Contact with and (suspected) exposure to covid-19: Secondary | ICD-10-CM | POA: Diagnosis present

## 2020-01-23 DIAGNOSIS — I252 Old myocardial infarction: Secondary | ICD-10-CM | POA: Diagnosis not present

## 2020-01-23 DIAGNOSIS — Z87891 Personal history of nicotine dependence: Secondary | ICD-10-CM

## 2020-01-23 DIAGNOSIS — Z794 Long term (current) use of insulin: Secondary | ICD-10-CM

## 2020-01-23 DIAGNOSIS — D631 Anemia in chronic kidney disease: Secondary | ICD-10-CM | POA: Diagnosis present

## 2020-01-23 HISTORY — DX: Disorder of kidney and ureter, unspecified: N28.9

## 2020-01-23 LAB — CBC
HCT: 34.5 % — ABNORMAL LOW (ref 39.0–52.0)
Hemoglobin: 10.8 g/dL — ABNORMAL LOW (ref 13.0–17.0)
MCH: 27.7 pg (ref 26.0–34.0)
MCHC: 31.3 g/dL (ref 30.0–36.0)
MCV: 88.5 fL (ref 80.0–100.0)
Platelets: 209 10*3/uL (ref 150–400)
RBC: 3.9 MIL/uL — ABNORMAL LOW (ref 4.22–5.81)
RDW: 14.8 % (ref 11.5–15.5)
WBC: 9.7 10*3/uL (ref 4.0–10.5)
nRBC: 0 % (ref 0.0–0.2)

## 2020-01-23 LAB — BASIC METABOLIC PANEL
Anion gap: 8 (ref 5–15)
BUN: 25 mg/dL — ABNORMAL HIGH (ref 6–20)
CO2: 25 mmol/L (ref 22–32)
Calcium: 7.5 mg/dL — ABNORMAL LOW (ref 8.9–10.3)
Chloride: 103 mmol/L (ref 98–111)
Creatinine, Ser: 4.21 mg/dL — ABNORMAL HIGH (ref 0.61–1.24)
GFR, Estimated: 16 mL/min — ABNORMAL LOW (ref >60)
Glucose, Bld: 256 mg/dL — ABNORMAL HIGH (ref 70–99)
Potassium: 3.7 mmol/L (ref 3.5–5.1)
Sodium: 136 mmol/L (ref 135–145)

## 2020-01-23 LAB — HEPARIN LEVEL (UNFRACTIONATED): Heparin Unfractionated: 3.6 IU/mL — ABNORMAL HIGH (ref 0.30–0.70)

## 2020-01-23 LAB — RESPIRATORY PANEL BY RT PCR (FLU A&B, COVID)
Influenza A by PCR: NEGATIVE
Influenza B by PCR: NEGATIVE
SARS Coronavirus 2 by RT PCR: NEGATIVE

## 2020-01-23 LAB — PROTIME-INR
INR: 1.2 (ref 0.8–1.2)
Prothrombin Time: 14.9 seconds (ref 11.4–15.2)

## 2020-01-23 LAB — APTT: aPTT: 36 seconds (ref 24–36)

## 2020-01-23 LAB — TROPONIN I (HIGH SENSITIVITY)
Troponin I (High Sensitivity): 247 ng/L (ref <18)
Troponin I (High Sensitivity): 1111 ng/L (ref <18)

## 2020-01-23 MED ORDER — NITROGLYCERIN 2 % TD OINT
0.5000 [in_us] | TOPICAL_OINTMENT | Freq: Once | TRANSDERMAL | Status: AC
  Administered 2020-01-23: 0.5 [in_us] via TOPICAL
  Filled 2020-01-23: qty 1

## 2020-01-23 MED ORDER — AMIODARONE IV BOLUS ONLY 150 MG/100ML
150.0000 mg | Freq: Once | INTRAVENOUS | Status: AC
  Administered 2020-01-23: 150 mg via INTRAVENOUS
  Filled 2020-01-23: qty 100

## 2020-01-23 MED ORDER — AMIODARONE HCL IN DEXTROSE 360-4.14 MG/200ML-% IV SOLN
60.0000 mg/h | INTRAVENOUS | Status: AC
  Administered 2020-01-23: 60 mg/h via INTRAVENOUS
  Filled 2020-01-23: qty 200

## 2020-01-23 MED ORDER — HEPARIN (PORCINE) 25000 UT/250ML-% IV SOLN
2400.0000 [IU]/h | INTRAVENOUS | Status: DC
  Administered 2020-01-23: 1600 [IU]/h via INTRAVENOUS
  Administered 2020-01-24: 2200 [IU]/h via INTRAVENOUS
  Administered 2020-01-24: 1900 [IU]/h via INTRAVENOUS
  Administered 2020-01-25: 2400 [IU]/h via INTRAVENOUS
  Filled 2020-01-23 (×4): qty 250

## 2020-01-23 MED ORDER — AMIODARONE HCL IN DEXTROSE 360-4.14 MG/200ML-% IV SOLN
30.0000 mg/h | INTRAVENOUS | Status: DC
  Administered 2020-01-24 – 2020-01-25 (×4): 30 mg/h via INTRAVENOUS
  Filled 2020-01-23 (×4): qty 200

## 2020-01-23 MED ORDER — METOPROLOL TARTRATE 5 MG/5ML IV SOLN
5.0000 mg | Freq: Once | INTRAVENOUS | Status: DC
  Filled 2020-01-23: qty 5

## 2020-01-23 MED ORDER — HEPARIN BOLUS VIA INFUSION
2000.0000 [IU] | Freq: Once | INTRAVENOUS | Status: AC
  Administered 2020-01-23: 2000 [IU] via INTRAVENOUS
  Filled 2020-01-23: qty 2000

## 2020-01-23 MED ORDER — FENTANYL CITRATE (PF) 100 MCG/2ML IJ SOLN
50.0000 ug | Freq: Once | INTRAMUSCULAR | Status: AC
  Administered 2020-01-23: 50 ug via INTRAVENOUS
  Filled 2020-01-23: qty 2

## 2020-01-23 NOTE — ED Notes (Signed)
Date and time results received: 01/23/20 09:24pm Test: Troponin Critical Value: 247  Name of Provider Notified: Merlyn Lot  Orders Received? n/a

## 2020-01-23 NOTE — H&P (Signed)
History and Physical   Edwin [name] SWN:[id number] DOB: [birth year] DOA: 01/23/2020  Referring MD/NP/PA: Dr. Quentin Cornwall  PCP: Caryl Never, MD   Patient coming from: Home  Chief Complaint: Chest pain  HPI: Edwin [name] is a 49 y.o. male with medical history significant of end-stage renal disease on hemodialysis Mondays Wednesdays and Fridays, diabetes, morbid obesity, essential hypertension, coronary artery disease, chronic systolic heart failure, hyperlipidemia, paroxysmal atrial fibrillation who presented to the ER after sudden onset of chest pressure following hemodialysis.  Patient said he went to his dialysis today went home and was feeling bad.  He thought they [place] have over dialyzed him and remove more fluid than usual.  He tried to rest and then started having significant chest pressure rated as 7 out of 10 radiating to his left jaw and arm.  He was diaphoretic and feeling like he was going to throw up.  He took some nitroglycerin no improvement.  Took his blood pressure and it was low systolic ranging between 80 and 90.  He therefore called EMS and came to the ER.  While in the ER patient was upset and went into A. fib with RVR.  Cardiology consulted.  He has been on amiodarone and full anticoagulation at home.  Patient switched to IV amiodarone.  Currently his rate is ranging between 90-1 10.  This chest pain has eased up.  Patient being admitted to the hospital with what appears to be non-ST elevation MI.  His troponin is about 300.  Previously it was in the 39s which appears to be his baseline as dialysis patient.  He is currently is stable no nausea or vomiting.  Chest pain has resolved..  ED Course: Temperature 98.2 blood pressure 160/75 pulse 95 respiratory 20 oxygen sats 95% on room air.  White count 9.7 hemoglobin 10.8 platelets 209 sodium 136 potassium 3.7 chloride is 103 CO2 25 BUN 25 creatinine 4.1 calcium 7.5 INR 1.12 glucose 256.  COVID-19 screen is negative.  PT 14.9 INR  1.2.  Chest x-ray shows no active cardiopulmonary disease.  Initial troponin is 247-second set is 1111.  EKG showed A. fib with RVR with a rate of 136.  Patient started on IV heparin, IV amiodarone and being admitted to the hospital with non-ST elevation MI and A. fib with RVR.  Review of Systems: As per HPI otherwise 10 point review of systems negative.    Past Medical History:  Diagnosis Date  . CAD (coronary artery disease)   . Chronic systolic heart failure ([place])   . Diabetes mellitus without complication ([place])   . Heart attack ([place])   . Hyperlipidemia   . Hypertension   . Ischemic cardiomyopathy   . Paroxysmal atrial fibrillation ([name])   . Renal disorder     Past Surgical History:  Procedure Laterality Date  . [employer] pacemaker    . CARDIAC SURGERY    . IRRIGATION AND DEBRIDEMENT KNEE Left 12/17/2017   Procedure: LEFT KNEE DEBRIDEMENT AND SYNOVECTOMY;  Surgeon: Thornton Park, MD;  Location: [facility];  Service: Orthopedics;  Laterality: Left;  . JOINT REPLACEMENT    . LEFT HEART CATH AND CORONARY ANGIOGRAPHY Left 03/09/2018   Procedure: LEFT HEART CATH AND CORONARY ANGIOGRAPHY;  Surgeon: Nelva Bush, MD;  Location: [facility];  Service: Cardiovascular;  Laterality: Left;  . PERCUTANEOUS CORONARY STENT INTERVENTION (PCI-S)       reports that he has quit smoking. His smokeless tobacco use includes snuff. He reports that he  does not drink alcohol and does not use drugs.  Allergies  Allergen Reactions  . Losartan     Hyperkalemia (K>6)  . Morphine Anxiety  . Morphine And Related Other (See Comments)    Overdose in hospital per patient report    Family History  Problem Relation Age of Onset  . Diabetes Mother   . Peripheral vascular disease Mother   . COPD Father      Prior to Admission medications   Medication Sig Start Date End Date Taking? Authorizing Provider  albuterol (PROVENTIL HFA;VENTOLIN HFA) 108 (90 Base) MCG/ACT inhaler Inhale 2  puffs into the lungs every 6 (six) hours as needed for wheezing or shortness of breath. 04/29/18  Yes Schuyler Amor, MD  Alirocumab 150 MG/ML SOAJ Inject 150 mg into the skin. Every 14 days 06/24/18  Yes [provider]  allopurinol (ZYLOPRIM) 100 MG tablet Take 100 mg by mouth 2 (two) times daily.  03/18/18  Yes [provider]  amiodarone (PACERONE) 200 MG tablet Take 1 tablet (200 mg total) by mouth 2 (two) times daily. 01/14/20  Yes Wouk, Ailene Rud, MD  amLODipine (NORVASC) 10 MG tablet Take 10 mg by mouth daily. 09/30/18  Yes [provider]  atorvastatin (LIPITOR) 80 MG tablet Take 80 mg by mouth every evening.  09/14/17  Yes [provider]  BRILINTA 90 MG TABS tablet Take 90 mg by mouth 2 (two) times daily. 09/14/17  Yes [provider]  bumetanide (BUMEX) 2 MG tablet Take 2 mg by mouth 2 (two) times daily. 12/06/19  Yes [provider]  cholecalciferol (VITAMIN D) 1000 units tablet Take 5,000 Units by mouth daily.   Yes [provider]  ELIQUIS 5 MG TABS tablet Take 5 mg by mouth 2 (two) times daily. 01/03/20  Yes [provider]  ezetimibe (ZETIA) 10 MG tablet Take 10 mg by mouth daily. 10/14/17  Yes [provider]  FLUoxetine (PROZAC) 20 MG capsule Take 60 mg by mouth at bedtime.  12/04/17  Yes [provider]  gabapentin (NEURONTIN) 300 MG capsule Take 300 mg by mouth 2 (two) times a day. 09/27/18  Yes [provider]  insulin aspart (NOVOLOG) 100 UNIT/ML injection Inject 15 Units into the skin 3 (three) times daily with meals. Patient taking differently: Inject 20 Units into the skin 3 (three) times daily with meals.  12/20/17  Yes Sainani, Belia Heman, MD  insulin detemir (LEVEMIR) 100 UNIT/ML injection Inject 0.4 mLs (40 Units total) into the skin 2 (two) times daily. Patient taking differently: Inject 40 Units into the skin at bedtime.  12/20/17  Yes Henreitta Leber, MD  isosorbide mononitrate  (IMDUR) 30 MG 24 hr tablet Take 1 tablet (30 mg total) by mouth daily. 10/30/18  Yes Vaughan Basta, MD  metoprolol succinate (TOPROL-XL) 100 MG 24 hr tablet Take 100 mg by mouth daily. 01/03/20  Yes [provider]  Multiple Vitamin (MULTIVITAMIN WITH MINERALS) TABS tablet Take 1 tablet by mouth daily. 10/31/18  Yes Vaughan Basta, MD  omeprazole (PRILOSEC) 40 MG capsule Take 40 mg by mouth 2 (two) times daily. 11/25/19  Yes [provider]  ondansetron (ZOFRAN) 4 MG tablet Take 4 mg by mouth every 12 (twelve) hours as needed for nausea. 01/04/20  Yes [provider]  OZEMPIC, 0.25 OR 0.5 MG/DOSE, 2 MG/1.5ML SOPN Inject 0.5 mg into the skin every 7 (seven) days.  04/23/18  Yes [provider]  RENVELA 800 MG tablet Take 1,600  mg by mouth 3 (three) times daily with meals. 10/14/17  Yes [provider]  VOLTAREN 1 % GEL Apply 2 g topically 3 (three) times daily as needed (left leg pain).  12/04/17  Yes [provider]  carvedilol (COREG) 3.125 MG tablet Take 1 tablet (3.125 mg total) by mouth See admin instructions. Take 1 tablet (25MG ) by mouth every morning and 2 tablets (50MG ) by mouth every night Patient not taking: Reported on 01/23/2020 10/30/18   Vaughan Basta, MD  nitroGLYCERIN (NITROSTAT) 0.4 MG SL tablet Place 0.4 mg under the tongue every 5 (five) minutes as needed for chest pain.    [provider]    Physical Exam: Vitals:   01/23/20 2100 01/23/20 2200 01/23/20 2215 01/23/20 2230  BP:  (!) 159/121 (!) 141/93 (!) 153/84  Pulse:  95 (!) 54 79  Resp: 13 18 12 16   Temp:      TempSrc:      SpO2:  100% 100% 100%  Weight:      Height:          Constitutional: Acutely ill looking, anxious, no distress Vitals:   01/23/20 2100 01/23/20 2200 01/23/20 2215 01/23/20 2230  BP:  (!) 159/121 (!) 141/93 (!) 153/84  Pulse:  95 (!) 54 79  Resp: 13 18 12 16   Temp:      TempSrc:      SpO2:  100% 100% 100%  Weight:       Height:       Eyes: PERRL, lids and conjunctivae normal ENMT: Mucous membranes are moist. Posterior pharynx clear of any exudate or lesions.Normal dentition.  Neck: normal, supple, no masses, no thyromegaly Respiratory: clear to auscultation bilaterally, no wheezing, bilateral basal crackles. Normal respiratory effort. No accessory muscle use.  Cardiovascular: Irregularly irregular rate and rhythm with some tachycardia, no murmurs / rubs / gallops. No extremity edema. 2+ pedal pulses. No carotid bruits.  Abdomen: Obese, no tenderness, no masses palpated. No hepatosplenomegaly. Bowel sounds positive.  Musculoskeletal: no clubbing / cyanosis. No joint deformity upper and lower extremities. Good ROM, no contractures. Normal muscle tone.  Skin: no rashes, lesions, ulcers. No induration Neurologic: CN 2-12 grossly intact. Sensation intact, DTR normal. Strength 5/5 in all 4.  Psychiatric: Normal judgment and insight. Alert and oriented x 3.  Depressed mood.     Labs on Admission: I have personally reviewed following labs and imaging studies  CBC: Recent Labs  Lab 01/23/20 2022  WBC 9.7  HGB 10.8*  HCT 34.5*  MCV 88.5  PLT 443   Basic Metabolic Panel: Recent Labs  Lab 01/23/20 2022  NA 136  K 3.7  CL 103  CO2 25  GLUCOSE 256*  BUN 25*  CREATININE 4.21*  CALCIUM 7.5*   GFR: Estimated Creatinine Clearance: 33.7 mL/min (A) (by C-G formula based on SCr of 4.21 mg/dL (H)). Liver Function Tests: No results for input(s): AST, ALT, ALKPHOS, BILITOT, PROT, ALBUMIN in the last 168 hours. No results for input(s): LIPASE, AMYLASE in the last 168 hours. No results for input(s): AMMONIA in the last 168 hours. Coagulation Profile: Recent Labs  Lab 01/23/20 2218  INR 1.2   Cardiac Enzymes: No results for input(s): CKTOTAL, CKMB, CKMBINDEX, TROPONINI in the last 168 hours. BNP (last 3 results) No results for input(s): PROBNP in the last 8760 hours. HbA1C: No results for  input(s): HGBA1C in the last 72 hours. CBG: No results for input(s): GLUCAP in the last 168 hours. Lipid Profile: No results for  input(s): CHOL, HDL, LDLCALC, TRIG, CHOLHDL, LDLDIRECT in the last 72 hours. Thyroid Function Tests: No results for input(s): TSH, T4TOTAL, FREET4, T3FREE, THYROIDAB in the last 72 hours. Anemia Panel: No results for input(s): VITAMINB12, FOLATE, FERRITIN, TIBC, IRON, RETICCTPCT in the last 72 hours. Urine analysis:    Component Value Date/Time   COLORURINE STRAW (A) 10/28/2018 2145   APPEARANCEUR CLEAR (A) 10/28/2018 2145   LABSPEC 1.008 10/28/2018 2145   PHURINE 5.0 10/28/2018 2145   GLUCOSEU 50 (A) 10/28/2018 2145   HGBUR SMALL (A) 10/28/2018 2145   BILIRUBINUR NEGATIVE 10/28/2018 2145   KETONESUR NEGATIVE 10/28/2018 2145   PROTEINUR 100 (A) 10/28/2018 2145   NITRITE NEGATIVE 10/28/2018 2145   LEUKOCYTESUR NEGATIVE 10/28/2018 2145   Sepsis Labs: @LABRCNTIP (procalcitonin:4,lacticidven:4) ) Recent Results (from the past 240 hour(s))  Respiratory Panel by RT PCR (Flu A&B, Covid) - Nasopharyngeal Swab     Status: None   Collection Time: 01/14/20 12:24 AM   Specimen: Nasopharyngeal Swab  Result Value Ref Range Status   SARS Coronavirus 2 by RT PCR NEGATIVE NEGATIVE Final    Comment: (NOTE) SARS-CoV-2 target nucleic acids are NOT DETECTED.  The SARS-CoV-2 RNA is generally detectable in upper respiratoy specimens during the acute phase of infection. The lowest concentration of SARS-CoV-2 viral copies this assay can detect is 131 copies/mL. A negative result does not preclude SARS-Cov-2 infection and should not be used as the sole basis for treatment or other patient management decisions. A negative result [place] occur with  improper specimen collection/handling, submission of specimen other than nasopharyngeal swab, presence of viral mutation(s) within the areas targeted by this assay, and inadequate number of viral copies (<131 copies/mL). A negative  result must be combined with clinical observations, patient history, and epidemiological information. The expected result is Negative.  Fact Sheet for Patients:  PinkCheek.be  Fact Sheet for Healthcare Providers:  GravelBags.it  This test is no t yet approved or cleared by the [place] FDA and  has been authorized for detection and/or diagnosis of SARS-CoV-2 by FDA under an Emergency Use Authorization (EUA). This EUA will remain  in effect (meaning this test can be used) for the duration of the COVID-19 declaration under Section 564(b)(1) of the Act, 21 U.S.C. section 360bbb-3(b)(1), unless the authorization is terminated or revoked sooner.     Influenza A by PCR NEGATIVE NEGATIVE Final   Influenza B by PCR NEGATIVE NEGATIVE Final    Comment: (NOTE) The Xpert Xpress SARS-CoV-2/FLU/RSV assay is intended as an aid in  the diagnosis of influenza from Nasopharyngeal swab specimens and  should not be used as a sole basis for treatment. Nasal washings and  aspirates are unacceptable for Xpert Xpress SARS-CoV-2/FLU/RSV  testing.  Fact Sheet for Patients: PinkCheek.be  Fact Sheet for Healthcare Providers: GravelBags.it  This test is not yet approved or cleared by the [place] FDA and  has been authorized for detection and/or diagnosis of SARS-CoV-2 by  FDA under an Emergency Use Authorization (EUA). This EUA will remain  in effect (meaning this test can be used) for the duration of the  Covid-19 declaration under Section 564(b)(1) of the Act, 21  U.S.C. section 360bbb-3(b)(1), unless the authorization is  terminated or revoked. Performed at [facility], [address] [place], [place] [address]      Radiological Exams on Admission: DG Chest 2 View  Result Date: 01/23/2020 CLINICAL DATA:  Chest pain. EXAM: CHEST - 2 VIEW COMPARISON:  January 13, 2020 FINDINGS: There is  a dual lead AICD. Stable right-sided venous catheter positioning is noted. The cardiac silhouette is mildly enlarged. Both lungs are clear. Multiple chronic left-sided rib fractures are seen. IMPRESSION: No active cardiopulmonary disease. Electronically Signed   By: Virgina Norfolk M.D.   On: 01/23/2020 20:39    EKG: Independently reviewed.  Atrial fibrillation with a rate of 136.  Assessment/Plan Principal Problem:   Rapid atrial fibrillation ([name]) Active Problems:   NSTEMI (non-ST elevated myocardial infarction) ([name])   Syncope and collapse   ESRD (end stage renal disease) ([place])   CAD (coronary artery disease)   Diabetes ([place])   Essential hypertension   Hyperlipidemia     #1 non-ST elevation MI: Based on patient's clinical presentation as well as rising enzymes weight above his baseline as a dialysis patient, he appears to be having non-ST elevation MI.  Patient will be admitted to stepdown unit.  IV heparin.  Continue with beta-blockers and other cardiac medicines.  Cardiology to follow and make further recommendations.  Chest pain currently resolved.  Continue to cycle enzymes.  #2  A. fib with RVR: Follow recommendations from cardiology.  Currently on heparin drip while holding his Eliquis.  Patient to continue with amiodarone IV.  Other acute rate control medications from home.  #3 end-stage renal disease: Hemodialysis Mondays Wednesdays and Fridays.  Patient has been dialyzed today.  Continue monitoring.  #4 diabetes: Initiate sliding scale insulin with home regimen.  #5 essential hypertension: Continue home regimen.  #6 hyperlipidemia: Continue with statin  #7 morbid obesity: Counseling.   DVT prophylaxis: Heparin drip Code Status: Full code Family Communication: No family at bedside Disposition Plan: Home Consults called: Cardiology consulted by ER Admission status: Inpatient  Severity of Illness: The appropriate patient status for this  patient is INPATIENT. Inpatient status is judged to be reasonable and necessary in order to provide the required intensity of service to ensure the patient's safety. The patient's presenting symptoms, physical exam findings, and initial radiographic and laboratory data in the context of their chronic comorbidities is felt to place them at high risk for further clinical deterioration. Furthermore, it is not anticipated that the patient will be medically stable for discharge from the hospital within 2 midnights of admission. The following factors support the patient status of inpatient.   " The patient's presenting symptoms include chest pain. " The worrisome physical exam findings include A. fib with RVR. " The initial radiographic and laboratory data are worrisome because of rising troponins. " The chronic co-morbidities include coronary artery disease and end-stage renal disease.   * I certify that at the point of admission it is my clinical judgment that the patient will require inpatient hospital care spanning beyond 2 midnights from the point of admission due to high intensity of service, high risk for further deterioration and high frequency of surveillance required.Barbette Merino MD [facility] Pager [phone]  If 7PM-7AM, please contact night-coverage [email or web address] Password [email or web address]  01/23/2020, 11:04 PM

## 2020-01-23 NOTE — ED Notes (Signed)
Hospitalist at bedside 

## 2020-01-23 NOTE — ED Triage Notes (Signed)
PT to ED via EMS from home c/o CP starting approx 7pm today. PT has hx of MI. PT is dialysis pt, received full tx this morning, pt thinks they took off more than the usual 2.5L. PT go 324 ASA enroute as well as 1 SL nitro. PT comes with 20G R AC PT states he also took 2 nitro at home.

## 2020-01-23 NOTE — ED Notes (Signed)
Md at bedside

## 2020-01-23 NOTE — Consult Note (Signed)
ANTICOAGULATION CONSULT NOTE - Initial Consult  Pharmacy Consult for Heparin Infusion Indication: chest pain/ACS  Patient Measurements: Height: 6\' 2"  (188 cm) Weight: (!) 164 kg (361 lb 8.9 oz) IBW/kg (Calculated) : 82.2 Heparin Dosing Weight: 121 kg  Labs: Recent Labs    01/23/20 2022  HGB 10.8*  HCT 34.5*  PLT 209  CREATININE 4.21*  TROPONINIHS 247*    Estimated Creatinine Clearance: 33.7 mL/min (A) (by C-G formula based on SCr of 4.21 mg/dL (H)).   Medical History: Past Medical History:  Diagnosis Date  . CAD (coronary artery disease)   . Chronic systolic heart failure ([place])   . Diabetes mellitus without complication ([place])   . Heart attack ([place])   . Hyperlipidemia   . Hypertension   . Ischemic cardiomyopathy   . Paroxysmal atrial fibrillation ([name])   . Renal disorder     Medications:  Apixaban 5 mg BID (last dose 10/25 at 1830 but patient reports throwing medication back up)  Assessment: Patient is a 49 y/o M with medical history including CAD, heart attack, atrial fibrillation on apixaban, ESRD on HD who presented to the ED 10/25 with chief complaint of chest pain refractory to SL nitroglycerin. EKG pending. Troponin elevated to 247. Pharmacy has been consulted to initiate heparin infusion for ACS.  Baseline CBC with Hgb 10.8 which appears c/w baseline. Platelets within normal limits. Baseline anti-Xa level, aPTT, and PT-INR pending.   Goal of Therapy:  Heparin level 0.3-0.7 units/ml aPTT 66 - 102 seconds Monitor platelets by anticoagulation protocol: Yes   Plan:  --Will give 2000 unit IV bolus (1/2 bolus) given questionable proximity of last dose of apixaban --Initiate heparin infusion at 1600 units/hr --aPTT 8 hours after initiation of infusion --Daily CBC per protocol; patient on Brilinta + apixaban prior to admission. ESRD on HD, suspect prolonged clearance of apixaban  Benita Gutter 01/23/2020,9:30 PM

## 2020-01-23 NOTE — ED Provider Notes (Signed)
[facility] Emergency Department Provider Note    First MD Initiated Contact with Patient 01/23/20 2054     (approximate)  I have reviewed the triage vital signs and the nursing notes.   HISTORY  Chief Complaint Chest Pain    HPI [place] Pam Bornemann is a 49 y.o. male below listed past medical history presents to the ER for evaluation of midsternal chest food radiates arms and jaw associated diaphoresis like around 7 PM tonight.  He is a dialysis patient and receives dialysis today but felt that they might have pulled off more fluid than typical.  States that when he had the chest pain he took nitro without any improvement and checked his blood pressure and was actually low 80 systolic.  Heart rate was in the 50s.  EMS was called was given aspirin and nitro.  States his pain resolved.  Denies any recent fevers.  No cough or congestion.    Past Medical History:  Diagnosis Date  . CAD (coronary artery disease)   . Chronic systolic heart failure ([place])   . Diabetes mellitus without complication ([place])   . Heart attack ([place])   . Hyperlipidemia   . Hypertension   . Ischemic cardiomyopathy   . Paroxysmal atrial fibrillation ([name])   . Renal disorder    Family History  Problem Relation Age of Onset  . Diabetes Mother   . Peripheral vascular disease Mother   . COPD Father    Past Surgical History:  Procedure Laterality Date  . [employer] pacemaker    . CARDIAC SURGERY    . IRRIGATION AND DEBRIDEMENT KNEE Left 12/17/2017   Procedure: LEFT KNEE DEBRIDEMENT AND SYNOVECTOMY;  Surgeon: Thornton Park, MD;  Location: [facility] ORS;  Service: Orthopedics;  Laterality: Left;  . JOINT REPLACEMENT    . LEFT HEART CATH AND CORONARY ANGIOGRAPHY Left 03/09/2018   Procedure: LEFT HEART CATH AND CORONARY ANGIOGRAPHY;  Surgeon: Nelva Bush, MD;  Location: [place] CV LAB;  Service: Cardiovascular;  Laterality: Left;  . PERCUTANEOUS CORONARY STENT INTERVENTION  (PCI-S)     Patient Active Problem List   Diagnosis Date Noted  . Syncope and collapse 10/28/2018  . Chest pain 07/08/2018  . NSTEMI (non-ST elevated myocardial infarction) ([place]) 03/08/2018  . Infection of left knee ([place]) 12/16/2017      Prior to Admission medications   Medication Sig Start Date End Date Taking? Authorizing Provider  albuterol (PROVENTIL HFA;VENTOLIN HFA) 108 (90 Base) MCG/ACT inhaler Inhale 2 puffs into the lungs every 6 (six) hours as needed for wheezing or shortness of breath. 04/29/18  Yes Schuyler Amor, MD  Alirocumab 150 MG/ML SOAJ Inject 150 mg into the skin. Every 14 days 06/24/18  Yes [provider]  allopurinol (ZYLOPRIM) 100 MG tablet Take 100 mg by mouth 2 (two) times daily.  03/18/18  Yes [provider]  amiodarone (PACERONE) 200 MG tablet Take 1 tablet (200 mg total) by mouth 2 (two) times daily. 01/14/20  Yes Wouk, Ailene Rud, MD  amLODipine (NORVASC) 10 MG tablet Take 10 mg by mouth daily. 09/30/18  Yes [provider]  atorvastatin (LIPITOR) 80 MG tablet Take 80 mg by mouth every evening.  09/14/17  Yes [provider]  BRILINTA 90 MG TABS tablet Take 90 mg by mouth 2 (two) times daily. 09/14/17  Yes [provider]  bumetanide (BUMEX) 2 MG tablet Take 2 mg by mouth 2 (two) times daily. 12/06/19  Yes [provider]  cholecalciferol (  VITAMIN D) 1000 units tablet Take 5,000 Units by mouth daily.   Yes [provider]  ELIQUIS 5 MG TABS tablet Take 5 mg by mouth 2 (two) times daily. 01/03/20  Yes [provider]  ezetimibe (ZETIA) 10 MG tablet Take 10 mg by mouth daily. 10/14/17  Yes [provider]  FLUoxetine (PROZAC) 20 MG capsule Take 60 mg by mouth at bedtime.  12/04/17  Yes [provider]  gabapentin (NEURONTIN) 300 MG capsule Take 300 mg by mouth 2 (two) times a day. 09/27/18  Yes [provider]  insulin aspart (NOVOLOG) 100 UNIT/ML injection Inject 15 Units  into the skin 3 (three) times daily with meals. Patient taking differently: Inject 20 Units into the skin 3 (three) times daily with meals.  12/20/17  Yes Sainani, Belia Heman, MD  insulin detemir (LEVEMIR) 100 UNIT/ML injection Inject 0.4 mLs (40 Units total) into the skin 2 (two) times daily. Patient taking differently: Inject 40 Units into the skin at bedtime.  12/20/17  Yes Henreitta Leber, MD  isosorbide mononitrate (IMDUR) 30 MG 24 hr tablet Take 1 tablet (30 mg total) by mouth daily. 10/30/18  Yes Vaughan Basta, MD  metoprolol succinate (TOPROL-XL) 100 MG 24 hr tablet Take 100 mg by mouth daily. 01/03/20  Yes [provider]  Multiple Vitamin (MULTIVITAMIN WITH MINERALS) TABS tablet Take 1 tablet by mouth daily. 10/31/18  Yes Vaughan Basta, MD  omeprazole (PRILOSEC) 40 MG capsule Take 40 mg by mouth 2 (two) times daily. 11/25/19  Yes [provider]  ondansetron (ZOFRAN) 4 MG tablet Take 4 mg by mouth every 12 (twelve) hours as needed for nausea. 01/04/20  Yes [provider]  OZEMPIC, 0.25 OR 0.5 MG/DOSE, 2 MG/1.5ML SOPN Inject 0.5 mg into the skin every 7 (seven) days.  04/23/18  Yes [provider]  RENVELA 800 MG tablet Take 1,600 mg by mouth 3 (three) times daily with meals. 10/14/17  Yes [provider]  VOLTAREN 1 % GEL Apply 2 g topically 3 (three) times daily as needed (left leg pain).  12/04/17  Yes [provider]  carvedilol (COREG) 3.125 MG tablet Take 1 tablet (3.125 mg total) by mouth See admin instructions. Take 1 tablet (25MG ) by mouth every morning and 2 tablets (50MG ) by mouth every night Patient not taking: Reported on 01/23/2020 10/30/18   Vaughan Basta, MD  nitroGLYCERIN (NITROSTAT) 0.4 MG SL tablet Place 0.4 mg under the tongue every 5 (five) minutes as needed for chest pain.    [provider]    Allergies Losartan, Morphine, and Morphine and related    Social History Social History    Tobacco Use  . 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 Smokeless tobacco: Current User    Types: Snuff  Vaping Use  . Vaping Use: Never used  Substance Use Topics  . Alcohol use: Never  . Drug use: Never    Review of Systems Patient denies headaches, rhinorrhea, blurry vision, numbness, shortness of breath, chest pain, edema, cough, abdominal pain, nausea, vomiting, diarrhea, dysuria, fevers, rashes or hallucinations unless otherwise stated above in HPI. ____________________________________________   PHYSICAL EXAM:  VITAL SIGNS: Vitals:   01/23/20 2215 01/23/20 2230  BP: (!) 141/93 (!) 153/84  Pulse: (!) 54 79  Resp: 12 16  Temp:    SpO2: 100% 100%    Constitutional: Alert and oriented.  Eyes: Conjunctivae are normal.  Head: Atraumatic. Nose: No congestion/rhinnorhea. Mouth/Throat: Mucous membranes are moist.   Neck: No  stridor. Painless ROM.  Cardiovascular: Normal rate, regular rhythm. Grossly normal heart sounds.  Good peripheral circulation. Respiratory: Normal respiratory effort.  No retractions. Lungs with bibasilar crackles. Gastrointestinal: Soft and nontender. No distention. No abdominal bruits. No CVA tenderness. Genitourinary:  Musculoskeletal: No lower extremity tenderness. 2+ BLE edema.  No joint effusions. Neurologic:  Normal speech and language. No gross focal neurologic deficits are appreciated. No facial droop Skin:  Skin is warm, dry and intact. No rash noted. Psychiatric: Mood and affect are normal. Speech and behavior are normal.  ____________________________________________   LABS (all labs ordered are listed, but only abnormal results are displayed)  Results for orders placed or performed during the hospital encounter of 01/23/20 (from the past 24 hour(s))  Basic metabolic panel     Status: Abnormal   Collection Time: 01/23/20  8:22 PM  Result Value Ref Range   Sodium 136 135 - 145 mmol/L   Potassium 3.7 3.5 - 5.1 mmol/L   Chloride 103 98 - 111  mmol/L   CO2 25 22 - 32 mmol/L   Glucose, Bld 256 (H) 70 - 99 mg/dL   BUN 25 (H) 6 - 20 mg/dL   Creatinine, Ser 4.21 (H) 0.61 - 1.24 mg/dL   Calcium 7.5 (L) 8.9 - 10.3 mg/dL   GFR, Estimated 16 (L) >60 mL/min   Anion gap 8 5 - 15  CBC     Status: Abnormal   Collection Time: 01/23/20  8:22 PM  Result Value Ref Range   WBC 9.7 4.0 - 10.5 K/uL   RBC 3.90 (L) 4.22 - 5.81 MIL/uL   Hemoglobin 10.8 (L) 13.0 - 17.0 g/dL   HCT 34.5 (L) 39 - 52 %   MCV 88.5 80.0 - 100.0 fL   MCH 27.7 26.0 - 34.0 pg   MCHC 31.3 30.0 - 36.0 g/dL   RDW 14.8 11.5 - 15.5 %   Platelets 209 150 - 400 K/uL   nRBC 0.0 0.0 - 0.2 %  Troponin I (High Sensitivity)     Status: Abnormal   Collection Time: 01/23/20  8:22 PM  Result Value Ref Range   Troponin I (High Sensitivity) 247 (HH) <18 ng/L  Troponin I (High Sensitivity)     Status: Abnormal   Collection Time: 01/23/20 10:01 PM  Result Value Ref Range   Troponin I (High Sensitivity) 1,111 (HH) <18 ng/L  Protime-INR     Status: None   Collection Time: 01/23/20 10:18 PM  Result Value Ref Range   Prothrombin Time 14.9 11.4 - 15.2 seconds   INR 1.2 0.8 - 1.2  APTT     Status: None   Collection Time: 01/23/20 10:18 PM  Result Value Ref Range   aPTT 36 24 - 36 seconds   ____________________________________________  EKG My review and personal interpretation at Time: 21:54   Indication: chest pain  Rate: 85  Rhythm: sinus Axis: normal Other: lvh, nonspecific st and t wave abn, c/w previous tracing 10/15 ____________________________________________  RADIOLOGY  I personally reviewed all radiographic images ordered to evaluate for the above acute complaints and reviewed radiology reports and findings.  These findings were personally discussed with the patient.  Please see medical record for radiology report.  ____________________________________________   PROCEDURES  Procedure(s) performed:  .Critical Care Performed by: Merlyn Lot, MD Authorized  by: Merlyn Lot, MD   Critical care provider statement:    Critical care time (minutes):  35   Critical care was necessary to treat or prevent imminent or life-threatening  deterioration of the following conditions:  Cardiac failure   Critical care was time spent personally by me on the following activities:  Discussions with consultants, evaluation of patient's response to treatment, examination of patient, ordering and performing treatments and interventions, ordering and review of laboratory studies, ordering and review of radiographic studies, pulse oximetry, re-evaluation of patient's condition, obtaining history from patient or surrogate and review of old charts      Critical Care performed:yes ____________________________________________   INITIAL IMPRESSION / [place] / ED COURSE  Pertinent labs & imaging results that were available during my care of the patient were reviewed by me and considered in my medical decision making (see chart for details).   DDX: ACS, pericarditis, pe, dissection, pna, bronchitis, costochondritis   Jaequan Propes Patlan is a 49 y.o. who presents to the ED with description of chest pain concerning for ACS or unstable angina.  Currently pain-free.  With extensive past cardiac medical history including prolonged hospitalization after V. fib cardiac arrest cardiogenic shock being placed on ECMO.  Also had recent hospitalization for chest pain and A. fib.  Is on amiodarone.  Also on Eliquis.  Denies any recent fevers. Not consistent with  PE as he is already anticoagulated.  Tabet be some demand ischemia in the setting of dialysis.  Blood will be sent for the but differential.  He is replaced on cardiac monitor.  Based on his significant past medical history informed patient that will likely require hospitalization.  Clinical Course as of Jan 23 2251  Mon Jan 23, 2020  2145 Patient reassessed and is still pain-free.  Mildly hypertensive will add on  Nitropaste.  Uncertain as to last time he took his Eliquis therefore will heparinize.  Have consulted cardiology.  Will consult hospitalist for admission.   [PR]  2153 Was notified by nurse that the patient had become quite upset and was trying to leave [place].  Walked into the room the patient was very upset saying that he was knocking to stay in this hospital.  Patient cursing saying that he would leave and was understanding that he could die.  During 1 of these spells the patient became more ill-appearing seem to have significantly worked himself up and had ripped off his telemetry leads.  Started having worsening pain.   [PR]  2204 Heart rate was in the 130 showing A. fib no STEMI criteria.  Case discussed in consultation with Dr. Ubaldo Glassing.  He does recommend amio bolus and infusion.  Agrees with plan for heparin.  Patient will require hospitalization.   [PR]  2249 Repeat EKG does not show STEMI criteria.  There are ST depressions in the setting of A. fib concerning for demand related ischemia in the setting of his tachycardia.  Repeat troponin has increased from previous.  Will continue to monitor.  Heparin infusing.  Symptoms have improved with better rate control.   [PR]    Clinical Course User Index [PR] Merlyn Lot, MD    The patient was evaluated in Emergency Department today for the symptoms described in the history of present illness. He/she was evaluated in the context of the global COVID-19 pandemic, which necessitated consideration that the patient might be at risk for infection with the SARS-CoV-2 virus that causes COVID-19. Institutional protocols and algorithms that pertain to the evaluation of patients at risk for COVID-19 are in a state of rapid change based on information released by regulatory bodies including the CDC and federal and state organizations. These  policies and algorithms were followed during the patient's care in the ED.  As part of my medical  decision making, I reviewed the following data within the [place] notes reviewed and incorporated, Labs reviewed, notes from prior ED visits and [place] Controlled Substance Database   ____________________________________________   FINAL CLINICAL IMPRESSION(S) / ED DIAGNOSES  Final diagnoses:  Unstable angina ([place])  Atrial fibrillation with rapid ventricular response ([place])      NEW MEDICATIONS STARTED DURING THIS VISIT:  New Prescriptions   No medications on file     Note:  This document was prepared using Dragon voice recognition software and [place] include unintentional dictation errors.    Merlyn Lot, MD 01/23/20 2252

## 2020-01-24 ENCOUNTER — Other Ambulatory Visit: Payer: Self-pay

## 2020-01-24 DIAGNOSIS — N186 End stage renal disease: Secondary | ICD-10-CM | POA: Diagnosis not present

## 2020-01-24 DIAGNOSIS — I4891 Unspecified atrial fibrillation: Secondary | ICD-10-CM | POA: Diagnosis not present

## 2020-01-24 DIAGNOSIS — I214 Non-ST elevation (NSTEMI) myocardial infarction: Secondary | ICD-10-CM | POA: Diagnosis not present

## 2020-01-24 LAB — TROPONIN I (HIGH SENSITIVITY)
Troponin I (High Sensitivity): 3987 ng/L (ref <18)
Troponin I (High Sensitivity): 4568 ng/L (ref <18)
Troponin I (High Sensitivity): 5554 ng/L (ref <18)
Troponin I (High Sensitivity): 5455 ng/L (ref <18)

## 2020-01-24 LAB — LIPID PANEL
Cholesterol: 213 mg/dL — ABNORMAL HIGH (ref 0–200)
HDL: 33 mg/dL — ABNORMAL LOW (ref >40)
LDL Cholesterol: 133 mg/dL — ABNORMAL HIGH (ref 0–99)
Total CHOL/HDL Ratio: 6.5 RATIO
Triglycerides: 237 mg/dL — ABNORMAL HIGH (ref <150)
VLDL: 47 mg/dL — ABNORMAL HIGH (ref 0–40)

## 2020-01-24 LAB — BASIC METABOLIC PANEL
Anion gap: 9 (ref 5–15)
Anion gap: 9 (ref 5–15)
BUN: 27 mg/dL — ABNORMAL HIGH (ref 6–20)
BUN: 29 mg/dL — ABNORMAL HIGH (ref 6–20)
CO2: 24 mmol/L (ref 22–32)
CO2: 24 mmol/L (ref 22–32)
Calcium: 7.8 mg/dL — ABNORMAL LOW (ref 8.9–10.3)
Calcium: 7.8 mg/dL — ABNORMAL LOW (ref 8.9–10.3)
Chloride: 104 mmol/L (ref 98–111)
Chloride: 104 mmol/L (ref 98–111)
Creatinine, Ser: 4.37 mg/dL — ABNORMAL HIGH (ref 0.61–1.24)
Creatinine, Ser: 4.52 mg/dL — ABNORMAL HIGH (ref 0.61–1.24)
GFR, Estimated: 16 mL/min — ABNORMAL LOW (ref >60)
GFR, Estimated: 15 mL/min — ABNORMAL LOW (ref >60)
Glucose, Bld: 215 mg/dL — ABNORMAL HIGH (ref 70–99)
Glucose, Bld: 260 mg/dL — ABNORMAL HIGH (ref 70–99)
Potassium: 3.6 mmol/L (ref 3.5–5.1)
Potassium: 3.8 mmol/L (ref 3.5–5.1)
Sodium: 137 mmol/L (ref 135–145)
Sodium: 137 mmol/L (ref 135–145)

## 2020-01-24 LAB — CBC
HCT: 32.3 % — ABNORMAL LOW (ref 39.0–52.0)
Hemoglobin: 10.1 g/dL — ABNORMAL LOW (ref 13.0–17.0)
MCH: 27.7 pg (ref 26.0–34.0)
MCHC: 31.3 g/dL (ref 30.0–36.0)
MCV: 88.5 fL (ref 80.0–100.0)
Platelets: 188 10*3/uL (ref 150–400)
RBC: 3.65 MIL/uL — ABNORMAL LOW (ref 4.22–5.81)
RDW: 15.1 % (ref 11.5–15.5)
WBC: 9 10*3/uL (ref 4.0–10.5)
nRBC: 0 % (ref 0.0–0.2)

## 2020-01-24 LAB — GLUCOSE, CAPILLARY
Glucose-Capillary: 113 mg/dL — ABNORMAL HIGH (ref 70–99)
Glucose-Capillary: 129 mg/dL — ABNORMAL HIGH (ref 70–99)
Glucose-Capillary: 189 mg/dL — ABNORMAL HIGH (ref 70–99)
Glucose-Capillary: 193 mg/dL — ABNORMAL HIGH (ref 70–99)
Glucose-Capillary: 195 mg/dL — ABNORMAL HIGH (ref 70–99)
Glucose-Capillary: 89 mg/dL (ref 70–99)

## 2020-01-24 LAB — APTT
aPTT: 49 seconds — ABNORMAL HIGH (ref 24–36)
aPTT: 42 seconds — ABNORMAL HIGH (ref 24–36)

## 2020-01-24 LAB — HEPARIN LEVEL (UNFRACTIONATED): Heparin Unfractionated: 2.7 IU/mL — ABNORMAL HIGH (ref 0.30–0.70)

## 2020-01-24 LAB — T4, FREE: Free T4: 0.71 ng/dL (ref 0.61–1.12)

## 2020-01-24 LAB — MAGNESIUM: Magnesium: 1.9 mg/dL (ref 1.7–2.4)

## 2020-01-24 LAB — TSH: TSH: 7.769 u[IU]/mL — ABNORMAL HIGH (ref 0.350–4.500)

## 2020-01-24 MED ORDER — NITROGLYCERIN 0.4 MG SUBLINGUAL TABLET
SUBLINGUAL | 3 refills | 1 days | Status: CP | PRN
Start: 2020-01-24 — End: 2021-01-23

## 2020-01-24 MED ORDER — SODIUM CHLORIDE 0.9% FLUSH: 3.0000 mL | INTRAVENOUS | Status: DC | PRN

## 2020-01-24 MED ORDER — ONDANSETRON HCL 4 MG/2ML IJ SOLN: 4.0000 mg | Freq: Four times a day (QID) | INTRAMUSCULAR | Status: DC | PRN

## 2020-01-24 MED ORDER — ACETAMINOPHEN 325 MG PO TABS
650.0000 mg | ORAL_TABLET | ORAL | Status: DC | PRN
  Administered 2020-01-24: 650 mg via ORAL
  Filled 2020-01-24: qty 2

## 2020-01-24 MED ORDER — METOPROLOL SUCCINATE ER 100 MG PO TB24
100.0000 mg | ORAL_TABLET | Freq: Every day | ORAL | Status: DC
  Administered 2020-01-24: 100 mg via ORAL
  Filled 2020-01-24: qty 2

## 2020-01-24 MED ORDER — ISOSORBIDE MONONITRATE ER 30 MG PO TB24
30.0000 mg | ORAL_TABLET | Freq: Every day | ORAL | Status: DC
  Administered 2020-01-24: 30 mg via ORAL
  Filled 2020-01-24: qty 1

## 2020-01-24 MED ORDER — ONDANSETRON HCL 4 MG PO TABS: 4.0000 mg | ORAL_TABLET | Freq: Two times a day (BID) | ORAL | Status: DC | PRN

## 2020-01-24 MED ORDER — INSULIN ASPART 100 UNIT/ML ~~LOC~~ SOLN
0.0000 [IU] | Freq: Three times a day (TID) | SUBCUTANEOUS | Status: DC
  Administered 2020-01-25: 4 [IU] via SUBCUTANEOUS
  Filled 2020-01-24: qty 1

## 2020-01-24 MED ORDER — HEPARIN BOLUS VIA INFUSION
3500.0000 [IU] | Freq: Once | INTRAVENOUS | Status: AC
  Administered 2020-01-24: 3500 [IU] via INTRAVENOUS
  Filled 2020-01-24: qty 3500

## 2020-01-24 MED ORDER — FLUOXETINE HCL 20 MG PO CAPS
60.0000 mg | ORAL_CAPSULE | Freq: Every day | ORAL | Status: DC
  Administered 2020-01-24 (×2): 60 mg via ORAL
  Filled 2020-01-24 (×3): qty 3

## 2020-01-24 MED ORDER — SODIUM CHLORIDE 0.9 % IV SOLN: INTRAVENOUS | Status: DC

## 2020-01-24 MED ORDER — FENTANYL CITRATE (PF) 100 MCG/2ML IJ SOLN
INTRAMUSCULAR | Status: AC
  Administered 2020-01-24: 12.5 ug via INTRAVENOUS
  Filled 2020-01-24: qty 2

## 2020-01-24 MED ORDER — DICLOFENAC SODIUM 1 % EX GEL
2.0000 g | Freq: Three times a day (TID) | CUTANEOUS | Status: DC | PRN
  Filled 2020-01-24: qty 100

## 2020-01-24 MED ORDER — ATORVASTATIN CALCIUM 80 MG PO TABS
80.0000 mg | ORAL_TABLET | Freq: Every evening | ORAL | Status: DC
  Administered 2020-01-24 – 2020-01-25 (×2): 80 mg via ORAL
  Filled 2020-01-24: qty 1
  Filled 2020-01-24: qty 4

## 2020-01-24 MED ORDER — POTASSIUM CHLORIDE CRYS ER 20 MEQ PO TBCR
20.0000 meq | EXTENDED_RELEASE_TABLET | Freq: Once | ORAL | Status: AC
  Administered 2020-01-24: 20 meq via ORAL
  Filled 2020-01-24: qty 1

## 2020-01-24 MED ORDER — SODIUM CHLORIDE 0.9 % IV SOLN: 250.0000 mL | INTRAVENOUS | Status: DC | PRN

## 2020-01-24 MED ORDER — GABAPENTIN 300 MG PO CAPS
300.0000 mg | ORAL_CAPSULE | Freq: Three times a day (TID) | ORAL | Status: DC
  Administered 2020-01-24 – 2020-01-25 (×4): 300 mg via ORAL
  Filled 2020-01-24 (×4): qty 1

## 2020-01-24 MED ORDER — FENTANYL CITRATE (PF) 100 MCG/2ML IJ SOLN: 12.5000 ug | Freq: Once | INTRAMUSCULAR | Status: AC

## 2020-01-24 MED ORDER — BUMETANIDE 1 MG PO TABS
2.0000 mg | ORAL_TABLET | Freq: Two times a day (BID) | ORAL | Status: DC
  Administered 2020-01-24 (×3): 2 mg via ORAL
  Filled 2020-01-24 (×6): qty 2

## 2020-01-24 MED ORDER — NITROGLYCERIN 0.4 MG SL SUBL: 0.4000 mg | SUBLINGUAL_TABLET | SUBLINGUAL | Status: DC | PRN

## 2020-01-24 MED ORDER — ASPIRIN 81 MG PO CHEW
81.0000 mg | CHEWABLE_TABLET | ORAL | Status: DC
  Filled 2020-01-24: qty 1

## 2020-01-24 MED ORDER — NITROGLYCERIN IN D5W 200-5 MCG/ML-% IV SOLN
0.0000 ug/min | INTRAVENOUS | Status: DC
  Administered 2020-01-24: 5 ug/min via INTRAVENOUS
  Administered 2020-01-24: 80 ug/min via INTRAVENOUS
  Filled 2020-01-24 (×2): qty 250

## 2020-01-24 MED ORDER — FENTANYL CITRATE (PF) 100 MCG/2ML IJ SOLN
12.5000 ug | INTRAMUSCULAR | Status: DC | PRN
  Administered 2020-01-24 (×2): 12.5 ug via INTRAVENOUS
  Filled 2020-01-24 (×2): qty 2

## 2020-01-24 MED ORDER — ADULT MULTIVITAMIN W/MINERALS CH
1.0000 | ORAL_TABLET | Freq: Every day | ORAL | Status: DC
  Administered 2020-01-24: 1 via ORAL
  Filled 2020-01-24: qty 1

## 2020-01-24 MED ORDER — AMLODIPINE BESYLATE 10 MG PO TABS
10.0000 mg | ORAL_TABLET | Freq: Every day | ORAL | Status: DC
  Administered 2020-01-24: 10 mg via ORAL
  Filled 2020-01-24: qty 2

## 2020-01-24 MED ORDER — INSULIN DETEMIR 100 UNIT/ML ~~LOC~~ SOLN
40.0000 [IU] | Freq: Every day | SUBCUTANEOUS | Status: DC
  Administered 2020-01-24 (×2): 40 [IU] via SUBCUTANEOUS
  Filled 2020-01-24 (×3): qty 0.4

## 2020-01-24 MED ORDER — MAGNESIUM OXIDE 400 (241.3 MG) MG PO TABS
400.0000 mg | ORAL_TABLET | Freq: Once | ORAL | Status: AC
  Administered 2020-01-24: 400 mg via ORAL
  Filled 2020-01-24: qty 1

## 2020-01-24 MED ORDER — TICAGRELOR 90 MG PO TABS
90.0000 mg | ORAL_TABLET | Freq: Two times a day (BID) | ORAL | Status: DC
  Administered 2020-01-24 (×2): 90 mg via ORAL
  Filled 2020-01-24 (×3): qty 1

## 2020-01-24 MED ORDER — SEMAGLUTIDE(0.25 OR 0.5MG/DOS) 2 MG/1.5ML ~~LOC~~ SOPN
0.5000 mg | PEN_INJECTOR | SUBCUTANEOUS | Status: DC
  Filled 2020-01-24: qty 0.38

## 2020-01-24 MED ORDER — SODIUM CHLORIDE 0.9% FLUSH
3.0000 mL | Freq: Two times a day (BID) | INTRAVENOUS | Status: DC
  Administered 2020-01-24 (×2): 3 mL via INTRAVENOUS

## 2020-01-24 MED ORDER — PANTOPRAZOLE SODIUM 40 MG PO TBEC
40.0000 mg | DELAYED_RELEASE_TABLET | Freq: Every day | ORAL | Status: DC
  Administered 2020-01-24: 40 mg via ORAL
  Filled 2020-01-24: qty 1

## 2020-01-24 MED ORDER — CARVEDILOL 3.125 MG PO TABS: 3.1250 mg | ORAL_TABLET | ORAL | Status: DC

## 2020-01-24 MED ORDER — INSULIN ASPART 100 UNIT/ML ~~LOC~~ SOLN: 0.0000 [IU] | Freq: Every day | SUBCUTANEOUS | Status: DC

## 2020-01-24 MED ORDER — POTASSIUM CHLORIDE CRYS ER 20 MEQ PO TBCR
40.0000 meq | EXTENDED_RELEASE_TABLET | Freq: Once | ORAL | Status: AC
  Administered 2020-01-24: 40 meq via ORAL
  Filled 2020-01-24: qty 2

## 2020-01-24 MED ORDER — ALLOPURINOL 100 MG PO TABS
100.0000 mg | ORAL_TABLET | Freq: Two times a day (BID) | ORAL | Status: DC
  Administered 2020-01-24 (×3): 100 mg via ORAL
  Filled 2020-01-24 (×6): qty 1

## 2020-01-24 MED ORDER — VITAMIN D3 25 MCG (1000 UNIT) PO TABS
5000.0000 [IU] | ORAL_TABLET | Freq: Every day | ORAL | Status: DC
  Administered 2020-01-24: 5000 [IU] via ORAL
  Filled 2020-01-24 (×3): qty 5

## 2020-01-24 MED ORDER — NITROGLYCERIN 2 % TD OINT
0.5000 [in_us] | TOPICAL_OINTMENT | Freq: Once | TRANSDERMAL | Status: AC
  Administered 2020-01-24: 0.5 [in_us] via TOPICAL

## 2020-01-24 MED ORDER — INSULIN ASPART 100 UNIT/ML ~~LOC~~ SOLN
20.0000 [IU] | Freq: Three times a day (TID) | SUBCUTANEOUS | Status: DC
  Administered 2020-01-24 – 2020-01-25 (×4): 20 [IU] via SUBCUTANEOUS
  Filled 2020-01-24 (×4): qty 1

## 2020-01-24 MED ORDER — ALBUTEROL SULFATE HFA 108 (90 BASE) MCG/ACT IN AERS
2.0000 | INHALATION_SPRAY | Freq: Four times a day (QID) | RESPIRATORY_TRACT | Status: DC | PRN
  Filled 2020-01-24 (×2): qty 6.7

## 2020-01-24 MED ORDER — EZETIMIBE 10 MG PO TABS
10.0000 mg | ORAL_TABLET | Freq: Every day | ORAL | Status: DC
  Administered 2020-01-24: 10 mg via ORAL
  Filled 2020-01-24 (×2): qty 1

## 2020-01-24 MED ORDER — SEVELAMER CARBONATE 800 MG PO TABS
1600.0000 mg | ORAL_TABLET | Freq: Three times a day (TID) | ORAL | Status: DC
  Administered 2020-01-24 – 2020-01-25 (×4): 1600 mg via ORAL
  Filled 2020-01-24 (×6): qty 2

## 2020-01-24 NOTE — Progress Notes (Addendum)
PROGRESS NOTE    Edwin [name]  KDX:[id number] DOB: [birth year] DOA: 01/23/2020 PCP: Caryl Never, MD   Chief complaint.  Chest pain. Brief Narrative:  Edwin [name] is a 49 y.o. male with medical history significant of end-stage renal disease on hemodialysis Mondays Wednesdays and Fridays, diabetes, morbid obesity, essential hypertension, coronary artery disease, chronic systolic heart failure, hyperlipidemia, paroxysmal atrial fibrillation who presented to the ER after sudden onset of chest pressure following hemodialysis.  At baseline, he was on oxygen as needed, only on dialysis. Upon arriving the emergency room, his EKG showed atrial fibrillation with rapid ventricle response, no ST elevation, chest x-ray did not show any pulmonary edema.  His troponin peaked at 5554.  He did seen by cardiology, started on aspirin, Brilinta, statin, heparin drip.  He was also placed on amnio drip for atrial fibrillation.  Scheduled catheter on 10/27..     Assessment & Plan:   Principal Problem:   Rapid atrial fibrillation ([name]) Active Problems:   NSTEMI (non-ST elevated myocardial infarction) ([place])   Syncope and collapse   ESRD (end stage renal disease) ([place])   CAD (coronary artery disease)   Diabetes ([place])   Essential hypertension   Hyperlipidemia  #1.  Non-ST elevation myocardial infarction. Patient has been seen by cardiology, continue current treatment with aspirin, Brilinta, statin and heparin. Patient chest pain is better today.  No short of breath.  No evidence of acute congestive heart failure. We will continue monitor patient in the progressive unit.  #2.  Atrial flutter with rapid ventricular response. Continue heparin drip, amiodarone drip was also continued by cardiology pain  3.  End-stage renal disease. Consult nephrology.  Dr. Holley Raring notified.  4.  Type 2 diabetes uncontrolled with hyperglycemia. Continue Levemir, scheduled short acting insulin and sliding scale  insulin.  5.  Morbid obesity.    DVT prophylaxis: Heparin drip Code Status: Full Family Communication: None Disposition Plan:  .   Status is: Inpatient  Remains inpatient appropriate because:Inpatient level of care appropriate due to severity of illness   Dispo: The patient is from: Home              Anticipated d/c is to: Home              Anticipated d/c date is: 2 days              Patient currently is not medically stable to d/c.        No intake/output data recorded. Total I/O In: -  Out: 9767 [Urine:1050]     Consultants:   cardiology and nephrology  Procedures: None  Antimicrobials: None  Subjective: Patient doing better today.  Chest pain essentially resolved.  Does not have any short of breath or cough. No abdominal pain nausea vomiting. No fever chills. No dysuria hematuria. No headache or dizziness.  Objective: Vitals:   01/24/20 0700 01/24/20 0800 01/24/20 0900 01/24/20 1200  BP: (!) 145/84 (!) 144/88 (!) 150/84 (!) 145/82  Pulse: 70 70 70 69  Resp: (!) 22 12 (!) 21 16  Temp:      TempSrc:      SpO2: 99% 99% 98% 100%  Weight:      Height:        Intake/Output Summary (Last 24 hours) at 01/24/2020 1254 Last data filed at 01/24/2020 1150 Gross per 24 hour  Intake --  Output 1050 ml  Net -1050 ml   Filed Weights   01/23/20 2021  Weight: (!) 164  kg    Examination:  General exam: Appears calm and comfortable, morbid obesity. Respiratory system: Clear to auscultation. Respiratory effort normal. Cardiovascular system: S1 & S2 heard, RRR. No JVD, murmurs, rubs, gallops or clicks. No pedal edema. Gastrointestinal system: Abdomen is nondistended, soft and nontender. No organomegaly or masses felt. Normal bowel sounds heard. Central nervous system: Alert and oriented. No focal neurological deficits. Extremities: Symmetric 5 x 5 power. Skin: No rashes, lesions or ulcers Psychiatry:  Mood & affect appropriate.     Data Reviewed: I  have personally reviewed following labs and imaging studies  CBC: Recent Labs  Lab 01/23/20 2022 01/24/20 0632  WBC 9.7 9.0  HGB 10.8* 10.1*  HCT 34.5* 32.3*  MCV 88.5 88.5  PLT 209 176   Basic Metabolic Panel: Recent Labs  Lab 01/23/20 2022 01/24/20 0118 01/24/20 0632  NA 136 137 137  K 3.7 3.6 3.8  CL 103 104 104  CO2 25 24 24   GLUCOSE 256* 215* 260*  BUN 25* 27* 29*  CREATININE 4.21* 4.37* 4.52*  CALCIUM 7.5* 7.8* 7.8*  MG  --  1.9  --    GFR: Estimated Creatinine Clearance: 31.4 mL/min (A) (by C-G formula based on SCr of 4.52 mg/dL (H)). Liver Function Tests: No results for input(s): AST, ALT, ALKPHOS, BILITOT, PROT, ALBUMIN in the last 168 hours. No results for input(s): LIPASE, AMYLASE in the last 168 hours. No results for input(s): AMMONIA in the last 168 hours. Coagulation Profile: Recent Labs  Lab 01/23/20 2218  INR 1.2   Cardiac Enzymes: No results for input(s): CKTOTAL, CKMB, CKMBINDEX, TROPONINI in the last 168 hours. BNP (last 3 results) No results for input(s): PROBNP in the last 8760 hours. HbA1C: No results for input(s): HGBA1C in the last 72 hours. CBG: Recent Labs  Lab 01/24/20 0221 01/24/20 0942 01/24/20 1205  GLUCAP 195* 193* 189*   Lipid Profile: Recent Labs    01/24/20 0632  CHOL 213*  HDL 33*  LDLCALC 133*  TRIG 237*  CHOLHDL 6.5   Thyroid Function Tests: Recent Labs    01/24/20 0118  TSH 7.769*  FREET4 0.71   Anemia Panel: No results for input(s): VITAMINB12, FOLATE, FERRITIN, TIBC, IRON, RETICCTPCT in the last 72 hours. Sepsis Labs: No results for input(s): PROCALCITON, LATICACIDVEN in the last 168 hours.  Recent Results (from the past 240 hour(s))  Respiratory Panel by RT PCR (Flu A&B, Covid) - Nasopharyngeal Swab     Status: None   Collection Time: 01/23/20 10:01 PM   Specimen: Nasopharyngeal Swab  Result Value Ref Range Status   SARS Coronavirus 2 by RT PCR NEGATIVE NEGATIVE Final    Comment:  (NOTE) SARS-CoV-2 target nucleic acids are NOT DETECTED.  The SARS-CoV-2 RNA is generally detectable in upper respiratoy specimens during the acute phase of infection. The lowest concentration of SARS-CoV-2 viral copies this assay can detect is 131 copies/mL. A negative result does not preclude SARS-Cov-2 infection and should not be used as the sole basis for treatment or other patient management decisions. A negative result Kading occur with  improper specimen collection/handling, submission of specimen other than nasopharyngeal swab, presence of viral mutation(s) within the areas targeted by this assay, and inadequate number of viral copies (<131 copies/mL). A negative result must be combined with clinical observations, patient history, and epidemiological information. The expected result is Negative.  Fact Sheet for Patients:  PinkCheek.be  Fact Sheet for Healthcare Providers:  GravelBags.it  This test is no t yet approved or cleared  by the [place] and  has been authorized for detection and/or diagnosis of SARS-CoV-2 by FDA under an Emergency Use Authorization (EUA). This EUA will remain  in effect (meaning this test can be used) for the duration of the COVID-19 declaration under Section 564(b)(1) of the Act, 21 U.S.C. section 360bbb-3(b)(1), unless the authorization is terminated or revoked sooner.     Influenza A by PCR NEGATIVE NEGATIVE Final   Influenza B by PCR NEGATIVE NEGATIVE Final    Comment: (NOTE) The Xpert Xpress SARS-CoV-2/FLU/RSV assay is intended as an aid in  the diagnosis of influenza from Nasopharyngeal swab specimens and  should not be used as a sole basis for treatment. Nasal washings and  aspirates are unacceptable for Xpert Xpress SARS-CoV-2/FLU/RSV  testing.  Fact Sheet for Patients: PinkCheek.be  Fact Sheet for Healthcare  Providers: GravelBags.it  This test is not yet approved or cleared by the [place] FDA and  has been authorized for detection and/or diagnosis of SARS-CoV-2 by  FDA under an Emergency Use Authorization (EUA). This EUA will remain  in effect (meaning this test can be used) for the duration of the  Covid-19 declaration under Section 564(b)(1) of the Act, 21  U.S.C. section 360bbb-3(b)(1), unless the authorization is  terminated or revoked. Performed at [facility], [address] [place], [place] [address]          Radiology Studies: DG Chest 2 View  Result Date: 01/23/2020 CLINICAL DATA:  Chest pain. EXAM: CHEST - 2 VIEW COMPARISON:  January 13, 2020 FINDINGS: There is a dual lead AICD. Stable right-sided venous catheter positioning is noted. The cardiac silhouette is mildly enlarged. Both lungs are clear. Multiple chronic left-sided rib fractures are seen. IMPRESSION: No active cardiopulmonary disease. Electronically Signed   By: Virgina Norfolk M.D.   On: 01/23/2020 20:39        Scheduled Meds: . allopurinol  100 mg Oral BID  . amLODipine  10 mg Oral Daily  . [START ON 01/25/2020] aspirin  81 mg Oral Pre-Cath  . atorvastatin  80 mg Oral QPM  . bumetanide  2 mg Oral BID  . carvedilol  3.125 mg Oral See admin instructions  . cholecalciferol  5,000 Units Oral Daily  . ezetimibe  10 mg Oral Daily  . FLUoxetine  60 mg Oral QHS  . gabapentin  300 mg Oral TID  . insulin aspart  0-20 Units Subcutaneous TID WC  . insulin aspart  0-5 Units Subcutaneous QHS  . insulin aspart  20 Units Subcutaneous TID WC  . insulin detemir  40 Units Subcutaneous QHS  . isosorbide mononitrate  30 mg Oral Daily  . metoprolol succinate  100 mg Oral Daily  . metoprolol tartrate  5 mg Intravenous Once  . multivitamin with minerals  1 tablet Oral Daily  . pantoprazole  40 mg Oral Daily  . [START ON 01/29/2020] Semaglutide(0.25 or 0.5MG /DOS)  0.5 mg  Subcutaneous Q7 days  . sevelamer carbonate  1,600 mg Oral TID WC  . sodium chloride flush  3 mL Intravenous Q12H  . ticagrelor  90 mg Oral BID   Continuous Infusions: . sodium chloride    . [START ON 01/25/2020] sodium chloride    . amiodarone 30 mg/hr (01/24/20 0447)  . heparin 1,900 Units/hr (01/24/20 1058)  . nitroGLYCERIN 75 mcg/min (01/24/20 0654)     LOS: 1 day    Time spent: 28 minutes    Sharen Hones, MD [facility]   To contact  the attending provider between 7A-7P or the covering provider during after hours 7P-7A, please log into the web site [email or web address] and access using universal [place] password for that web site. If you do not have the password, please call the hospital operator.  01/24/2020, 12:54 PM

## 2020-01-24 NOTE — Progress Notes (Signed)
Cross Cover Note RN called with patient complaints of crushing chest pain. Review of chart last Troponin 1111 @ 2200, on amio for a fib with RVR Bedside Repeat EKG now with SR, LVH, abnormal R wave progression with early transition,  borderline QTc 497, previous 503 - amio drip initially stopped BMP K previously not above 4 and no mag level - chem panel ordered - supplement to get K above 4 and mag above 2 Trend trop Systolic pressures 478'G Additional nitropaste ordered and fentanyl (morphine allergy) given for pain    Patient describes pain as similar to that in past - 2018 when he had his heart attack - , tearful and fear noted secondary to his discomfort  Explained need to control pressure and plan for nitro gtt if past  Was not effective  Dr. Ubaldo Glassing paged - discussed - nitro gtt ordered. Amio infusion resumed

## 2020-01-24 NOTE — ED Notes (Signed)
Pt placed on a hospital bed

## 2020-01-24 NOTE — ED Notes (Signed)
NP Sharion Settler at bedside ,  Advised to stop Amio drip , give nitro paste and pain medication , draw labs

## 2020-01-24 NOTE — TOC Initial Note (Signed)
Transition of Care [facility]) - Initial/Assessment Note    Patient Details  Name: Edwin [name] MRN: [id number] Date of Birth: Jan 16, 1971  Transition of [facility]) CM/SW Contact:    Edwin Dike, RN Phone Number: 01/24/2020, 10:29 AM  Clinical Narrative:                  Met with patient in room.  He reports living in a home with his spouse Edwin [name].  He also has a therapy dog trained to detect changes in glucose and emergency needs.    He uses a cane to walk at home, but when out in public uses a "hover (automated wheelchair), he states his PCP is currently trying to get him one of these for his personal use r/t HD.    Reports he no difficulties getting his medications.  He and his wife still drive.  He reports his wife is a good support for him in home.   He has had [place] services in the past, but was told since he is able to be community bound he does not qualify for them at this time.  Expected Discharge Plan: Home/Self Care Barriers to Discharge: Continued Medical Work up   Patient Goals and CMS Choice Patient states their goals for this hospitalization and ongoing recovery are:: To return home with my wife and dog CMS Medicare.gov Compare Post Acute Care list provided to:: Patient Choice offered to / list presented to : Patient  Expected Discharge Plan and Services Expected Discharge Plan: Home/Self Care   Discharge Planning Services: CM Consult Post Acute Care Choice: NA Living arrangements for the past 2 months: Single Family Home                      Prior Living Arrangements/Services Living arrangements for the past 2 months: Single Family Home Lives with:: Self, Spouse Patient language and need for interpreter reviewed:: Yes        Need for Family Participation in Patient Care: No (Comment) Care giver support system in place?: Yes (comment) Current home services: DME, Other (comment) (DME: cane, walker, bedside commode, wheelchair, shower chair   Other:   Therapeutic dog that detects decrease in blood sugar, etc...) Criminal Activity/Legal Involvement Pertinent to Current Situation/Hospitalization: No - Comment as needed       Emotional Assessment Appearance:: Appears stated age Attitude/Demeanor/Rapport: Engaged, Ambitious, Self-Confident, Gracious Affect (typically observed): Accepting, Appropriate, Calm, Pleasant Orientation: : Oriented to Self, Oriented to Place, Oriented to  Time, Oriented to Situation Alcohol / Substance Use: Not Applicable Psych Involvement: No (comment)  Admission diagnosis:  Rapid atrial fibrillation [facility]) [I48.91] Patient Active Problem List   Diagnosis Date Noted  . ESRD (end stage renal disease) ([place]) 01/23/2020  . CAD (coronary artery disease) 01/23/2020  . Diabetes ([place]) 01/23/2020  . Essential hypertension 01/23/2020  . Hyperlipidemia 01/23/2020  . Rapid atrial fibrillation ([place]) 01/23/2020  . Syncope and collapse 10/28/2018  . Chest pain 07/08/2018  . NSTEMI (non-ST elevated myocardial infarction) ([place]) 03/08/2018  . Infection of left knee ([place]) 12/16/2017   PCP:  Caryl Never, MD Pharmacy:   [facility] [place], [place] - [address] [place] [address] Phone: [phone] Fax: [phone]  Readmission Risk Interventions Readmission Risk Prevention Plan 01/24/2020  Transportation Screening Complete  PCP or Specialist Appt within 3-5 Days Complete  HRI or Home Care Consult Complete  Social Work Consult for [place] Planning/Counseling Patient refused  Palliative  Care Screening Not Applicable  Medication Review (RN Care Manager) Referral to Pharmacy  Some recent data might be hidden

## 2020-01-24 NOTE — Consult Note (Addendum)
Cardiology Consultation Note    Patient ID: Edwin [name], MRN: [id number], DOB/AGE: 1970/10/28 49 y.o. Admit date: 01/23/2020   Date of Consult: 01/24/2020 Primary Physician: Caryl Never, MD Primary Cardiologist: Dr. Howard Pouch, [facility]  Chief Complaint: chest pain Reason for Consultation: chest pain with elevated troponjin.  Requesting MD: Dr. Roosevelt Locks  HPI: Edwin [name] is a 49 y.o. 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  Pain worsened with A. fib with RVR.  Patient has a Paediatric nurse pacemaker ICD model vigilant EL ICD [phone] with a lower rate of 60 upper rate of 130.  He was interrogated last month at his primary cardiologist office at [facility] which showed predominantly a sensed V sensed with a heavy A. fib burden.  He has been treated with amiodarone.  He was referred to electrophysiology for consideration for rhythm control options.  Based on prior cath, he has no additional options for PCI and medical management was recommended per Dr. Denman George.  He was admitted here earlier this month.  His prior cardiac history was a STEMI in December 2018 requiring Impella and emergent PCI followed by ECMO.  He has CKD 5.  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  His ejection fraction was 35%.  Cardiac catheterization in June 2021 revealed two-vessel coronary disease with a 90% proximal LAD stenosis, 80% proximal left circumflex stenosis with moderate diffuse disease and widely patent stents in the RCA.  He had PCI of the proximal LAD and left circumflex.  He presents with episodic chest pain.  His initial high-sensitivity  troponin was 247 is increased to 4568.  Serum creatinine is stable at 4.52.  Potassium 3.8.  He was placed on heparin.  Had had intermittent episodes of chest pains occurring during episodes of hypertension as well as with A. fib with RVR.  He was bolused with IV amiodarone and placed on amiodarone drip to try to prevent further arrhythmic events.  Was placed on IV nitro.  His AICD was interrogated with episodes of A. fib with RVR with no ICD firings.  Her vital signs show a blood pressure 147/89 with a pulse rate of 70.  Past Medical History:  Diagnosis Date  . CAD (coronary artery disease)   . Chronic systolic heart failure ([place])   . Diabetes mellitus without complication ([place])   . Heart attack ([place])   . Hyperlipidemia   . Hypertension   . Ischemic cardiomyopathy   . Paroxysmal atrial fibrillation ([name])   . Renal disorder       Surgical History:  Past Surgical History:  Procedure Laterality Date  . [employer] pacemaker    . CARDIAC SURGERY    . IRRIGATION AND DEBRIDEMENT KNEE Left 12/17/2017   Procedure: LEFT KNEE DEBRIDEMENT AND SYNOVECTOMY;  Surgeon: Thornton Park, MD;  Location: [facility] ORS;  Service: Orthopedics;  Laterality: Left;  . JOINT REPLACEMENT    . LEFT HEART CATH AND CORONARY ANGIOGRAPHY Left 03/09/2018   Procedure: LEFT HEART CATH AND CORONARY ANGIOGRAPHY;  Surgeon: Nelva Bush, MD;  Location: [facility] CV LAB;  Service: Cardiovascular;  Laterality: Left;  . PERCUTANEOUS CORONARY STENT INTERVENTION (PCI-S)  Home Meds: Prior to Admission medications   Medication Sig Start Date End Date Taking? Authorizing Provider  albuterol (PROVENTIL HFA;VENTOLIN HFA) 108 (90 Base) MCG/ACT inhaler Inhale 2 puffs into the lungs every 6 (six) hours as needed for wheezing or shortness of breath. 04/29/18  Yes Schuyler Amor, MD  Alirocumab 150 MG/ML SOAJ Inject 150 mg into the skin. Every 14 days 06/24/18  Yes [provider]  allopurinol (ZYLOPRIM) 100 MG  tablet Take 100 mg by mouth 2 (two) times daily.  03/18/18  Yes [provider]  amiodarone (PACERONE) 200 MG tablet Take 1 tablet (200 mg total) by mouth 2 (two) times daily. 01/14/20  Yes Wouk, Ailene Rud, MD  amLODipine (NORVASC) 10 MG tablet Take 10 mg by mouth daily. 09/30/18  Yes [provider]  atorvastatin (LIPITOR) 80 MG tablet Take 80 mg by mouth every evening.  09/14/17  Yes [provider]  BRILINTA 90 MG TABS tablet Take 90 mg by mouth 2 (two) times daily. 09/14/17  Yes [provider]  bumetanide (BUMEX) 2 MG tablet Take 2 mg by mouth 2 (two) times daily. 12/06/19  Yes [provider]  cholecalciferol (VITAMIN D) 1000 units tablet Take 5,000 Units by mouth daily.   Yes [provider]  ELIQUIS 5 MG TABS tablet Take 5 mg by mouth 2 (two) times daily. 01/03/20  Yes [provider]  ezetimibe (ZETIA) 10 MG tablet Take 10 mg by mouth daily. 10/14/17  Yes [provider]  FLUoxetine (PROZAC) 20 MG capsule Take 60 mg by mouth at bedtime.  12/04/17  Yes [provider]  gabapentin (NEURONTIN) 300 MG capsule Take 300 mg by mouth 2 (two) times a day. 09/27/18  Yes [provider]  insulin aspart (NOVOLOG) 100 UNIT/ML injection Inject 15 Units into the skin 3 (three) times daily with meals. Patient taking differently: Inject 20 Units into the skin 3 (three) times daily with meals.  12/20/17  Yes Sainani, Belia Heman, MD  insulin detemir (LEVEMIR) 100 UNIT/ML injection Inject 0.4 mLs (40 Units total) into the skin 2 (two) times daily. Patient taking differently: Inject 40 Units into the skin at bedtime.  12/20/17  Yes Henreitta Leber, MD  isosorbide mononitrate (IMDUR) 30 MG 24 hr tablet Take 1 tablet (30 mg total) by mouth daily. 10/30/18  Yes Vaughan Basta, MD  metoprolol succinate (TOPROL-XL) 100 MG 24 hr tablet Take 100 mg by mouth daily. 01/03/20  Yes [provider]  Multiple Vitamin (MULTIVITAMIN  WITH MINERALS) TABS tablet Take 1 tablet by mouth daily. 10/31/18  Yes Vaughan Basta, MD  omeprazole (PRILOSEC) 40 MG capsule Take 40 mg by mouth 2 (two) times daily. 11/25/19  Yes [provider]  ondansetron (ZOFRAN) 4 MG tablet Take 4 mg by mouth every 12 (twelve) hours as needed for nausea. 01/04/20  Yes [provider]  OZEMPIC, 0.25 OR 0.5 MG/DOSE, 2 MG/1.5ML SOPN Inject 0.5 mg into the skin every 7 (seven) days.  04/23/18  Yes [provider]  RENVELA 800 MG tablet Take 1,600 mg by mouth 3 (three) times daily with meals. 10/14/17  Yes [provider]  VOLTAREN 1 % GEL Apply 2 g topically 3 (three) times daily as needed (left leg pain).  12/04/17  Yes [provider]  carvedilol (COREG) 3.125 MG tablet Take 1 tablet (3.125 mg total) by mouth See admin instructions. Take 1 tablet (25MG ) by mouth every morning and 2 tablets (50MG ) by mouth every night Patient  not taking: Reported on 01/23/2020 10/30/18   Vaughan Basta, MD  nitroGLYCERIN (NITROSTAT) 0.4 MG SL tablet Place 0.4 mg under the tongue every 5 (five) minutes as needed for chest pain.    [provider]    Inpatient Medications:  . allopurinol  100 mg Oral BID  . amLODipine  10 mg Oral Daily  . atorvastatin  80 mg Oral QPM  . bumetanide  2 mg Oral BID  . carvedilol  3.125 mg Oral See admin instructions  . cholecalciferol  5,000 Units Oral Daily  . ezetimibe  10 mg Oral Daily  . FLUoxetine  60 mg Oral QHS  . gabapentin  300 mg Oral TID  . insulin aspart  0-20 Units Subcutaneous TID WC  . insulin aspart  0-5 Units Subcutaneous QHS  . insulin aspart  20 Units Subcutaneous TID WC  . insulin detemir  40 Units Subcutaneous QHS  . isosorbide mononitrate  30 mg Oral Daily  . metoprolol succinate  100 mg Oral Daily  . metoprolol tartrate  5 mg Intravenous Once  . multivitamin with minerals  1 tablet Oral Daily  . pantoprazole  40 mg Oral Daily  . potassium chloride  20 mEq  Oral Once  . Semaglutide(0.25 or 0.5MG /DOS)  0.5 mg Subcutaneous Q7 days  . sevelamer carbonate  1,600 mg Oral TID WC  . ticagrelor  90 mg Oral BID   . amiodarone 30 mg/hr (01/24/20 0447)  . heparin 1,600 Units/hr (01/23/20 2254)  . nitroGLYCERIN 75 mcg/min (01/24/20 0654)    Allergies:  Allergies  Allergen Reactions  . Losartan     Hyperkalemia (K>6)  . Morphine Anxiety  . Morphine And Related Other (See Comments)    Overdose in hospital per patient report    Social History   Socioeconomic History  . Marital status: Married    Spouse name: Not on file  . Number of children: Not on file  . Years of education: Not on file  . Highest education level: Not on file  Occupational History  . Not on file  Tobacco Use  . 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 Smokeless tobacco: Current User    Types: Snuff  Vaping Use  . Vaping Use: Never used  Substance and Sexual Activity  . Alcohol use: Never  . Drug use: Never  . Sexual activity: Not Currently    Birth control/protection: None  Other Topics Concern  . Not on file  Social History Narrative  . Not on file   Social Determinants of Health   Financial Resource Strain:   . Difficulty of Paying Living Expenses: Not on file  Food Insecurity:   . Worried About Charity fundraiser in the Last Year: Not on file  . Ran Out of Food in the Last Year: Not on file  Transportation Needs:   . Lack of Transportation (Medical): Not on file  . Lack of Transportation (Non-Medical): Not on file  Physical Activity:   . Days of Exercise per Week: Not on file  . Minutes of Exercise per Session: Not on file  Stress:   . Feeling of Stress : Not on file  Social Connections:   . Frequency of Communication with Friends and Family: Not on file  . Frequency of Social Gatherings with Friends and Family: Not on file  . Attends Religious Services: Not on file  . Active Member of Clubs or Organizations: Not on file  . Attends Archivist  Meetings: Not on file  .  Marital Status: Not on file  Intimate Partner Violence:   . Fear of Current or Ex-Partner: Not on file  . Emotionally Abused: Not on file  . Physically Abused: Not on file  . Sexually Abused: Not on file     Family History  Problem Relation Age of Onset  . Diabetes Mother   . Peripheral vascular disease Mother   . COPD Father      Review of Systems: A 12-system review of systems was performed and is negative except as noted in the HPI.  Labs: No results for input(s): CKTOTAL, CKMB, TROPONINI in the last 72 hours. Lab Results  Component Value Date   WBC 9.0 01/24/2020   HGB 10.1 (L) 01/24/2020   HCT 32.3 (L) 01/24/2020   MCV 88.5 01/24/2020   PLT 188 01/24/2020    Recent Labs  Lab 01/24/20 0632  NA 137  K 3.8  CL 104  CO2 24  BUN 29*  CREATININE 4.52*  CALCIUM 7.8*  GLUCOSE 260*   Lab Results  Component Value Date   CHOL 213 (H) 01/24/2020   HDL 33 (L) 01/24/2020   LDLCALC 133 (H) 01/24/2020   TRIG 237 (H) 01/24/2020   No results found for: DDIMER  Radiology/Studies:  DG Chest 2 View  Result Date: 01/23/2020 CLINICAL DATA:  Chest pain. EXAM: CHEST - 2 VIEW COMPARISON:  January 13, 2020 FINDINGS: There is a dual lead AICD. Stable right-sided venous catheter positioning is noted. The cardiac silhouette is mildly enlarged. Both lungs are clear. Multiple chronic left-sided rib fractures are seen. IMPRESSION: No active cardiopulmonary disease. Electronically Signed   By: Virgina Norfolk M.D.   On: 01/23/2020 20:39   DG Chest Portable 1 View  Result Date: 01/13/2020 CLINICAL DATA:  Chest pain EXAM: PORTABLE CHEST 1 VIEW COMPARISON:  October 28, 2018 FINDINGS: The heart size and mediastinal contours are unchanged with mild cardiomegaly. A left-sided ICD is noted. A right-sided central venous catheter seen with the tip in the right atrium. There is mild prominence of the central pulmonary vasculature. No large airspace consolidation or pleural  effusion. Healed left mid rib fractures are seen. IMPRESSION: Stable cardiomegaly and mild pulmonary vascular congestion. Electronically Signed   By: Prudencio Pair M.D.   On: 01/13/2020 22:56   ECHOCARDIOGRAM COMPLETE  Result Date: 01/15/2020    ECHOCARDIOGRAM REPORT   Patient Name:   Firas DAVID Minion Date of Exam: 01/14/2020 Medical Rec #:  [id number]      Height:       74.0 in Accession #:    [id number]     Weight:       354.6 lb Date of Birth:  08-05-1970      BSA:          2.772 m Patient Age:    83 years       BP:           123/71 mmHg Patient Gender: M              HR:           70 bpm. Exam Location:  [facility] Procedure: 2D Echo Indications:     Syncope 780.2 / R55                  Chest Pain 786.50 / R07.9  History:         Patient has prior history of Echocardiogram examinations, most  recent 10/29/2018. CAD, Pacemaker and Defibrillator; Risk                  Factors:Hypertension, Diabetes and Dyslipidemia.  Sonographer:     Avanell Shackleton Referring Phys:  [id number] [place] Diagnosing Phys: Bartholome Bill MD  Sonographer Comments: Technically difficult study due to poor echo windows, suboptimal apical window and patient is morbidly obese. IMPRESSIONS  1. Left ventricular ejection fraction, by estimation, is 45 to 50%. The left ventricle has mildly decreased function. Left ventricular endocardial border not optimally defined to evaluate regional wall motion. There is mild left ventricular hypertrophy.  Left ventricular diastolic parameters were normal.  2. Right ventricular systolic function was not well visualized. The right ventricular size is not well visualized.  3. Left atrial size was mildly dilated.  4. The mitral valve was not well visualized. Trivial mitral valve regurgitation.  5. The aortic valve was not well visualized. Aortic valve regurgitation is trivial. FINDINGS  Left Ventricle: Left ventricular ejection fraction, by estimation, is 45 to 50%. The left ventricle has mildly  decreased function. Left ventricular endocardial border not optimally defined to evaluate regional wall motion. Definity contrast agent was given IV to delineate the left ventricular endocardial borders. The left ventricular internal cavity size was normal in size. There is mild left ventricular hypertrophy. Left ventricular diastolic parameters were normal. Right Ventricle: The right ventricular size is not well visualized. Right vetricular wall thickness was not well visualized. Right ventricular systolic function was not well visualized. Left Atrium: Left atrial size was mildly dilated. Right Atrium: Right atrial size was normal in size. Pericardium: There is no evidence of pericardial effusion. Mitral Valve: The mitral valve was not well visualized. Trivial mitral valve regurgitation. Tricuspid Valve: The tricuspid valve is not well visualized. Tricuspid valve regurgitation is trivial. Aortic Valve: The aortic valve was not well visualized. Aortic valve regurgitation is trivial. Pulmonic Valve: The pulmonic valve was not well visualized. Pulmonic valve regurgitation is not visualized. Aorta: The aortic root is normal in size and structure. IAS/Shunts: The interatrial septum was not assessed.  LEFT VENTRICLE PLAX 2D LVIDd:         5.86 cm  Diastology LVIDs:         4.38 cm  LV e' lateral:   8.16 cm/s LV PW:         1.26 cm  LV E/e' lateral: 12.7 LV IVS:        1.18 cm LVOT diam:     2.50 cm LVOT Area:     4.91 cm  RIGHT VENTRICLE         IVC TAPSE (M-mode): 2.3 cm  IVC diam: 2.53 cm LEFT ATRIUM             Index       RIGHT ATRIUM           Index LA diam:        4.80 cm 1.73 cm/m  RA Area:     26.30 cm LA Vol (A2C):   81.7 ml 29.47 ml/m RA Volume:   106.00 ml 38.23 ml/m LA Vol (A4C):   89.5 ml 32.28 ml/m LA Biplane Vol: 86.4 ml 31.16 ml/m   AORTA Ao Root diam: 3.20 cm MITRAL VALVE                TRICUSPID VALVE MV Area (PHT): 3.77 cm     TR Peak grad:   44.6 mmHg MV Decel Time: 201 msec  TR Vmax:         334.00 cm/s MV E velocity: 104.00 cm/s MV A velocity: 68.70 cm/s   SHUNTS MV E/A ratio:  1.51         Systemic Diam: 2.50 cm Bartholome Bill MD Electronically signed by Bartholome Bill MD Signature Date/Time: 01/15/2020/8:25:54 AM    Final     Wt Readings from Last 3 Encounters:  01/23/20 (!) 164 kg  01/14/20 (!) 160.8 kg  10/30/18 (!) 149 kg    EKG: Sinus rhythm no ischemia  Physical Exam:  Blood pressure (!) 147/89, pulse 70, temperature 98.2 F (36.8 C), temperature source Oral, resp. rate (!) 24, height 6\' 2"  (1.88 m), weight (!) 164 kg, SpO2 98 %. Body mass index is 46.42 kg/m. General: Well developed, well nourished, in no acute distress. Head: Normocephalic, atraumatic, sclera non-icteric, no xanthomas, nares are without discharge.  Neck: Negative for carotid bruits. JVD not elevated. Lungs: Clear bilaterally to auscultation without wheezes, rales, or rhonchi. Breathing is unlabored. Heart: RRR with S1 S2. No murmurs, rubs, or gallops appreciated. Abdomen: Soft, non-tender, non-distended with normoactive bowel sounds. No hepatomegaly. No rebound/guarding. No obvious abdominal masses. Msk:  Strength and tone appear normal for age. Extremities: No clubbing or cyanosis. No edema.  Distal pedal pulses are 2+ and equal bilaterally. Neuro: Alert and oriented X 3. No facial asymmetry. No focal deficit. Moves all extremities spontaneously. Psych:  Responds to questions appropriately with a normal affect.     Assessment and Plan  49 year old male with history of coronary disease with history of ST elevation myocardial infarction in 0938 complicated by cardiac arrest and ventricular tachycardia.  Was treated with initially Impala followed by a hemodynamic support with ECMO and emergent PCI.  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 fib,  HF R EF with an EF of 35%.  Admitted after left-sided chest pain and elevated troponin to 4500.  Noted to have episodes of A. fib with RVR/VT not treated with shock.  1. Afib with rvr-.  No AICD treatment.  Electrolytes are normal.  Has a significant hs troponin  elevation likely secondary to demand however, progression of his cad Struve also be playing a role. Had an lad and lcx [facility] with severe diffuse disease otherwise not amenable to pci. Marland Kitchen  At previous admission, have increase the amiodarone to 200 mg twice daily and continued remainder of his medications.  He has an appointment with Dr. Willis Modena at [facility] scheduled.   Has been given bolus of iv amiodarone and placed on amiodarone drip with improvement in his afib episodes. Will hold Eliquis for now with  relook cath tomorrow am.   2.  Coronary disease-status post STEMI in 2018 with cardiac cath in June of this year showing two-vessel coronary disease with placement of drug-eluting stents in the proximal LAD and proximal circumflex.  RCA had diffuse disease with widely patent stent. Elevated troponin demand vs ischemia. Will hold Eliquis for consideration for invasive evaluation.   Would continue with atorvastatin at 80 mg daily, iv ntg and metoprolol succinate 100 mg daily.  3.  Congestive heart failure-HFrEF.  Chest x-ray showed no chf. Asymptomatic at present.  Would continue with Bumex at 2 mg twice daily.  4.  Hypertension-blood pressure appears to be in good control.  5.  End-stage renal  disease-continue with hemodialysis.  Does not appear to be severely volume overloaded or with significant electrolyte abnormalities at present. Had dialysis yesterday. Will need to coordinate dialysis based on cath.     Signed, Teodoro Spray MD 01/24/2020, 7:26 AM Pager: [phone]

## 2020-01-24 NOTE — ED Notes (Signed)
NP [place] at bedside

## 2020-01-24 NOTE — ED Notes (Signed)
Patient complaining of 8/10 chest pressure. Nitro drip to be increased, repeat ECG to be completed. Randol Kern NP paged to make aware.

## 2020-01-24 NOTE — Consult Note (Signed)
ANTICOAGULATION CONSULT NOTE  Pharmacy Consult for Heparin Infusion Indication: chest pain/ACS  Patient Measurements: Height: 6\' 2"  (188 cm) Weight: (!) 164 kg (361 lb 8.9 oz) IBW/kg (Calculated) : 82.2 Heparin Dosing Weight: 121 kg  Labs: Recent Labs    01/23/20 2022 01/23/20 2201 01/23/20 2218 01/24/20 0118 01/24/20 0118 01/24/20 0315 01/24/20 1093 01/24/20 0855 01/24/20 1824  HGB 10.8*  --   --   --   --   --  10.1*  --   --   HCT 34.5*  --   --   --   --   --  32.3*  --   --   PLT 209  --   --   --   --   --  188  --   --   APTT  --   --  36  --   --   --  49*  --  42*  LABPROT  --   --  14.9  --   --   --   --   --   --   INR  --   --  1.2  --   --   --   --   --   --   HEPARINUNFRC  --   --  3.60*  --   --   --  2.70*  --   --   CREATININE 4.21*  --   --  4.37*  --   --  4.52*  --   --   TROPONINIHS 247*   < >  --  3,987*   < > 4,568* 5,554* 5,455*  --    < > = values in this interval not displayed.    Estimated Creatinine Clearance: 31.4 mL/min (A) (by C-G formula based on SCr of 4.52 mg/dL (H)).   Medical History: Past Medical History:  Diagnosis Date  . CAD (coronary artery disease)   . Chronic systolic heart failure ([place])   . Diabetes mellitus without complication ([place])   . Heart attack ([place])   . Hyperlipidemia   . Hypertension   . Ischemic cardiomyopathy   . Paroxysmal atrial fibrillation ([name])   . Renal disorder     Medications:  Apixaban 5 mg BID (last dose 10/25 at 1830 but patient reports throwing medication back up)  Assessment: Patient is a 49 y/o M with medical history including CAD, heart attack, atrial fibrillation on apixaban, ESRD on HD who presented to the ED 10/25 with chief complaint of chest pain refractory to SL nitroglycerin. EKG pending. Troponin elevated to 247. Pharmacy has been consulted to initiate heparin infusion for ACS.  Baseline CBC with Hgb 10.8 which appears c/w baseline. Platelets within normal limits. Baseline anti-Xa  level (3.6), aPTT (36), and PT-INR (1.2).   10/26 0632 aPTT 49s @ 1600 units/hr (HL 2.70 - levels do no correlate)  10/26 1824 aPTT 42s @ 1900 units/hr  Goal of Therapy:  Heparin level 0.3-0.7 units/ml aPTT 66 - 102 seconds Monitor platelets by anticoagulation protocol: Yes   Plan:  --Will give another 3500 unit IV bolus --Increase heparin infusion to 2200 units/hr --aPTT and heparin level 8 hours after rate change of infusion --Daily CBC per protocol; patient on Brilinta + apixaban prior to admission. ESRD on HD, suspect prolonged clearance of apixaban  Benita Gutter Clinical Pharmacist 01/24/2020 6:48 PM

## 2020-01-24 NOTE — Progress Notes (Signed)
Inpatient Diabetes Program Recommendations  AACE/ADA: New Consensus Statement on Inpatient Glycemic Control (2015)  Target Ranges:  Prepandial:   less than 140 mg/dL      Peak postprandial:   less than 180 mg/dL (1-2 hours)      Critically ill patients:  140 - 180 mg/dL   Lab Results  Component Value Date   GLUCAP 193 (H) 01/24/2020   HGBA1C 8.1 (H) 01/14/2020    Review of Glycemic Control Results for Edwin [name], Edwin [name] (MRN [id number]) as of 01/24/2020 11:21  Ref. Range 01/24/2020 02:21 01/24/2020 09:42  Glucose-Capillary Latest Ref Range: 70 - 99 mg/dL 195 (H) 193 (H)   CP/Elevated troponin NSTEMI Diabetes history: DM 2, ESRD (HD-[name]) Outpatient Diabetes medications: Levemir 40 units qhs, Novolog 20 units tid, Semaglutide 0.5 mg weekly Current orders for Inpatient glycemic control:  Levemir 40 units qhs Novolog 0-20 units tid + hs scale Novolog 20 units tid meal coverage Semaglutide 0.5 mg weekly  Inpatient Diabetes Program Recommendations:    -  D/c semaglutide ( not on formulary) -  Reduce Novolog Correction scale to 0-9 units tid (Due to renal function)  Pt received 20 units of Novolog at breakfast. Will follow trends.  Thanks,  Tama Headings RN, MSN, BC-ADM Inpatient Diabetes Coordinator Team Pager [phone] (8a-5p)

## 2020-01-24 NOTE — ED Notes (Signed)
Patient educated regarding need for strict bed rest related to Heparin infusion and risk for bleeding should pt fall. Patient verbalized understanding of all discussed. Pt in hospital bed with bed low and locked and side rails raised x3. Call bell in reach.

## 2020-01-24 NOTE — Progress Notes (Signed)
°   01/24/20 0230  Clinical Encounter Type  Visited With Patient  Visit Type Initial;Spiritual support;Social support  Referral From Nurse  Consult/Referral To Chaplain  Responded to [facility] from ED for code stroke. When I arrived, Pt was alert and talking. Pt said he is not doing so well. He told Ch that his family would be coming tomorrow. Ch will follow up with Pt and family.

## 2020-01-24 NOTE — ED Notes (Signed)
Pt given cordless phone to call wife

## 2020-01-24 NOTE — Consult Note (Addendum)
ANTICOAGULATION CONSULT NOTE - Initial Consult  Pharmacy Consult for Heparin Infusion Indication: chest pain/ACS  Patient Measurements: Height: 6\' 2"  (188 cm) Weight: (!) 164 kg (361 lb 8.9 oz) IBW/kg (Calculated) : 82.2 Heparin Dosing Weight: 121 kg  Labs: Recent Labs    01/23/20 2022 01/23/20 2022 01/23/20 2201 01/23/20 2218 01/24/20 0118 01/24/20 0315 01/24/20 0632  HGB 10.8*  --   --   --   --   --  10.1*  HCT 34.5*  --   --   --   --   --  32.3*  PLT 209  --   --   --   --   --  188  APTT  --   --   --  36  --   --  49*  LABPROT  --   --   --  14.9  --   --   --   INR  --   --   --  1.2  --   --   --   HEPARINUNFRC  --   --   --  3.60*  --   --   --   CREATININE 4.21*  --   --   --  4.37*  --  4.52*  TROPONINIHS 247*   < > 1,111*  --  3,987* 4,568*  --    < > = values in this interval not displayed.    Estimated Creatinine Clearance: 31.4 mL/min (A) (by C-G formula based on SCr of 4.52 mg/dL (H)).   Medical History: Past Medical History:  Diagnosis Date  . CAD (coronary artery disease)   . Chronic systolic heart failure ([place])   . Diabetes mellitus without complication ([place])   . Heart attack ([place])   . Hyperlipidemia   . Hypertension   . Ischemic cardiomyopathy   . Paroxysmal atrial fibrillation ([name])   . Renal disorder     Medications:  Apixaban 5 mg BID (last dose 10/25 at 1830 but patient reports throwing medication back up)  Assessment: Patient is a 49 y/o M with medical history including CAD, heart attack, atrial fibrillation on apixaban, ESRD on HD who presented to the ED 10/25 with chief complaint of chest pain refractory to SL nitroglycerin. EKG pending. Troponin elevated to 247. Pharmacy has been consulted to initiate heparin infusion for ACS.  Baseline CBC with Hgb 10.8 which appears c/w baseline. Platelets within normal limits. Baseline anti-Xa level (3.6), aPTT (36), and PT-INR (1.2).   10/26 0632 APTT 49 seconds @ 1600 units/hr (HL 2.70 -  levels do no correlate)  Goal of Therapy:  Heparin level 0.3-0.7 units/ml aPTT 66 - 102 seconds Monitor platelets by anticoagulation protocol: Yes   Plan:  --Will give another 3500 unit IV bolus --Increase heparin infusion to 1900 units/hr --aPTT 8 hours after rate change of infusion --Daily CBC per protocol; patient on Brilinta + apixaban prior to admission. ESRD on HD, suspect prolonged clearance of apixaban  Lu Duffel, PharmD, BCPS Clinical Pharmacist 01/24/2020 7:22 AM

## 2020-01-25 ENCOUNTER — Ambulatory Visit
Admit: 2020-01-25 | Discharge: 2020-01-28 | Disposition: A | Discharge status: Home / Self Care | Payer: MEDICARE | Source: Other Acute Inpatient Hospital | Admitting: Cardiovascular Disease

## 2020-01-25 ENCOUNTER — Other Ambulatory Visit: Payer: Self-pay

## 2020-01-25 ENCOUNTER — Encounter
Admission: EM | Disposition: A | Discharge status: Other Acute Inpatient Hospital | Payer: Self-pay | Source: Home / Self Care | Attending: Internal Medicine

## 2020-01-25 DIAGNOSIS — E1122 Type 2 diabetes mellitus with diabetic chronic kidney disease: Secondary | ICD-10-CM | POA: Diagnosis not present

## 2020-01-25 DIAGNOSIS — I5022 Chronic systolic (congestive) heart failure: Secondary | ICD-10-CM

## 2020-01-25 DIAGNOSIS — N186 End stage renal disease: Secondary | ICD-10-CM | POA: Diagnosis not present

## 2020-01-25 DIAGNOSIS — I214 Non-ST elevation (NSTEMI) myocardial infarction: Secondary | ICD-10-CM | POA: Diagnosis not present

## 2020-01-25 DIAGNOSIS — I4891 Unspecified atrial fibrillation: Secondary | ICD-10-CM | POA: Diagnosis not present

## 2020-01-25 HISTORY — PX: LEFT HEART CATH AND CORONARY ANGIOGRAPHY: CATH118249

## 2020-01-25 LAB — CBC W/ AUTO DIFF
BASOPHILS ABSOLUTE COUNT: 0.1 10*9/L (ref 0.0–0.1)
BASOPHILS RELATIVE PERCENT: 0.5 %
EOSINOPHILS ABSOLUTE COUNT: 0.3 10*9/L (ref 0.0–0.4)
EOSINOPHILS RELATIVE PERCENT: 2.3 %
LARGE UNSTAINED CELLS: 1 % (ref 0–4)
LYMPHOCYTES ABSOLUTE COUNT: 1.9 10*9/L (ref 1.5–5.0)
LYMPHOCYTES RELATIVE PERCENT: 17.5 %
MEAN CORPUSCULAR HEMOGLOBIN CONC: 32.9 g/dL (ref 31.0–37.0)
MEAN CORPUSCULAR HEMOGLOBIN: 28.6 pg (ref 26.0–34.0)
MEAN CORPUSCULAR VOLUME: 86.8 fL (ref 80.0–100.0)
MEAN PLATELET VOLUME: 9.3 fL (ref 7.0–10.0)
MONOCYTES ABSOLUTE COUNT: 0.6 10*9/L (ref 0.2–0.8)
MONOCYTES RELATIVE PERCENT: 5.5 %
NEUTROPHILS ABSOLUTE COUNT: 7.9 10*9/L — ABNORMAL HIGH (ref 2.0–7.5)
NEUTROPHILS RELATIVE PERCENT: 73.3 %
PLATELET COUNT: 237 10*9/L (ref 150–440)
RED BLOOD CELL COUNT: 3.82 10*12/L — ABNORMAL LOW (ref 4.50–5.90)
RED CELL DISTRIBUTION WIDTH: 16.2 % — ABNORMAL HIGH (ref 12.0–15.0)
WBC ADJUSTED: 10.8 10*9/L (ref 4.5–11.0)

## 2020-01-25 LAB — BLOOD GAS CRITICAL CARE PANEL, VENOUS
BASE EXCESS VENOUS: 3.3 — ABNORMAL HIGH (ref -2.0–2.0)
CALCIUM IONIZED VENOUS (MG/DL): 4.57 mg/dL (ref 4.40–5.40)
GLUCOSE WHOLE BLOOD: 185 mg/dL — ABNORMAL HIGH (ref 70–179)
HCO3 VENOUS: 28 mmol/L — ABNORMAL HIGH (ref 22–27)
HEMOGLOBIN BLOOD GAS: 11.3 g/dL — ABNORMAL LOW (ref 13.50–17.50)
LACTATE BLOOD VENOUS: 1.6 mmol/L (ref 0.5–1.8)
O2 SATURATION VENOUS: 71.6 % (ref 40.0–85.0)
PCO2 VENOUS: 50 mmHg (ref 40–60)
PH VENOUS: 7.37 (ref 7.32–7.43)
SODIUM WHOLE BLOOD: 140 mmol/L (ref 135–145)

## 2020-01-25 LAB — INR: Coagulation tissue factor induced.INR:RelTime:Pt:PPP:Qn:Coag: 1.05

## 2020-01-25 LAB — BASOPHILS RELATIVE PERCENT: Basophils/100 leukocytes:NFr:Pt:Bld:Qn:Automated count: 0.5

## 2020-01-25 LAB — FIO2 VENOUS

## 2020-01-25 LAB — HEPARIN CORRELATION: Lab: 0.3

## 2020-01-25 LAB — PROTIME-INR: PROTIME: 12.3 s (ref 10.3–13.4)

## 2020-01-25 LAB — APTT
APTT: 47 s — ABNORMAL HIGH (ref 24.9–36.9)
aPTT: 54 seconds — ABNORMAL HIGH (ref 24–36)
aPTT: 32 seconds (ref 24–36)

## 2020-01-25 LAB — CBC WITH DIFFERENTIAL/PLATELET
Abs Immature Granulocytes: 0.06 10*3/uL (ref 0.00–0.07)
Basophils Absolute: 0.1 10*3/uL (ref 0.0–0.1)
Basophils Relative: 1 %
Eosinophils Absolute: 0.2 10*3/uL (ref 0.0–0.5)
Eosinophils Relative: 2 %
HCT: 32.6 % — ABNORMAL LOW (ref 39.0–52.0)
Hemoglobin: 10.3 g/dL — ABNORMAL LOW (ref 13.0–17.0)
Immature Granulocytes: 1 %
Lymphocytes Relative: 13 %
Lymphs Abs: 1.4 10*3/uL (ref 0.7–4.0)
MCH: 27.8 pg (ref 26.0–34.0)
MCHC: 31.6 g/dL (ref 30.0–36.0)
MCV: 88.1 fL (ref 80.0–100.0)
Monocytes Absolute: 0.8 10*3/uL (ref 0.1–1.0)
Monocytes Relative: 7 %
Neutro Abs: 8.6 10*3/uL — ABNORMAL HIGH (ref 1.7–7.7)
Neutrophils Relative %: 76 %
Platelets: 201 10*3/uL (ref 150–400)
RBC: 3.7 MIL/uL — ABNORMAL LOW (ref 4.22–5.81)
RDW: 15.1 % (ref 11.5–15.5)
WBC: 11.1 10*3/uL — ABNORMAL HIGH (ref 4.0–10.5)
nRBC: 0 % (ref 0.0–0.2)

## 2020-01-25 LAB — BASIC METABOLIC PANEL
Anion gap: 10 (ref 5–15)
BUN: 34 mg/dL — ABNORMAL HIGH (ref 6–20)
CO2: 23 mmol/L (ref 22–32)
Calcium: 8.2 mg/dL — ABNORMAL LOW (ref 8.9–10.3)
Chloride: 103 mmol/L (ref 98–111)
Creatinine, Ser: 5.13 mg/dL — ABNORMAL HIGH (ref 0.61–1.24)
GFR, Estimated: 13 mL/min — ABNORMAL LOW (ref >60)
Glucose, Bld: 224 mg/dL — ABNORMAL HIGH (ref 70–99)
Potassium: 4.4 mmol/L (ref 3.5–5.1)
Sodium: 136 mmol/L (ref 135–145)

## 2020-01-25 LAB — MAGNESIUM: Magnesium: 2 mg/dL (ref 1.7–2.4)

## 2020-01-25 LAB — GLUCOSE, CAPILLARY
Glucose-Capillary: 159 mg/dL — ABNORMAL HIGH (ref 70–99)
Glucose-Capillary: 152 mg/dL — ABNORMAL HIGH (ref 70–99)

## 2020-01-25 LAB — HEPARIN LEVEL (UNFRACTIONATED): Heparin Unfractionated: 1.18 IU/mL — ABNORMAL HIGH (ref 0.30–0.70)

## 2020-01-25 LAB — PHOSPHORUS: Phosphorus: 4.6 mg/dL (ref 2.5–4.6)

## 2020-01-25 SURGERY — LEFT HEART CATH AND CORONARY ANGIOGRAPHY
Anesthesia: Moderate Sedation

## 2020-01-25 MED ORDER — SODIUM CHLORIDE 0.9 % IV SOLN: 250.0000 mL | INTRAVENOUS | Status: DC | PRN

## 2020-01-25 MED ORDER — INSULIN DETEMIR 100 UNIT/ML ~~LOC~~ SOLN: 40.0000 [IU] | Freq: Every day | SUBCUTANEOUS | Status: DC

## 2020-01-25 MED ORDER — ACETAMINOPHEN 325 MG PO TABS: 650.0000 mg | ORAL_TABLET | ORAL | Status: DC | PRN

## 2020-01-25 MED ORDER — SODIUM CHLORIDE 0.9% FLUSH: 3.0000 mL | INTRAVENOUS | Status: DC | PRN

## 2020-01-25 MED ORDER — ALPRAZOLAM 0.25 MG PO TABS: 0.2500 mg | ORAL_TABLET | Freq: Three times a day (TID) | ORAL | Status: DC | PRN

## 2020-01-25 MED ORDER — LABETALOL HCL 5 MG/ML IV SOLN: 10.0000 mg | INTRAVENOUS | Status: DC | PRN

## 2020-01-25 MED ORDER — INSULIN ASPART 100 UNIT/ML ~~LOC~~ SOLN: 20.0000 [IU] | Freq: Three times a day (TID) | SUBCUTANEOUS | Status: DC

## 2020-01-25 MED ORDER — HEPARIN (PORCINE) IN NACL 2000-0.9 UNIT/L-% IV SOLN
INTRAVENOUS | Status: DC | PRN
  Administered 2020-01-25: 1000 mL

## 2020-01-25 MED ORDER — HEPARIN SODIUM (PORCINE) 1000 UNIT/ML IJ SOLN
INTRAMUSCULAR | Status: AC
  Filled 2020-01-25: qty 1

## 2020-01-25 MED ORDER — MIDAZOLAM HCL 2 MG/2ML IJ SOLN
INTRAMUSCULAR | Status: DC | PRN
  Administered 2020-01-25: 1 mg via INTRAVENOUS

## 2020-01-25 MED ORDER — ASPIRIN 81 MG PO CHEW
81.0000 mg | CHEWABLE_TABLET | ORAL | Status: DC
Start: 2020-01-26 — End: 2020-04-13

## 2020-01-25 MED ORDER — VERAPAMIL HCL 2.5 MG/ML IV SOLN
INTRAVENOUS | Status: AC
  Filled 2020-01-25: qty 2

## 2020-01-25 MED ORDER — ONDANSETRON HCL 4 MG/2ML IJ SOLN: 4.0000 mg | Freq: Four times a day (QID) | INTRAMUSCULAR | Status: DC | PRN

## 2020-01-25 MED ORDER — HEPARIN BOLUS VIA INFUSION
3000.0000 [IU] | Freq: Once | INTRAVENOUS | Status: AC
  Administered 2020-01-25: 3000 [IU] via INTRAVENOUS
  Filled 2020-01-25: qty 3000

## 2020-01-25 MED ORDER — CHLORHEXIDINE GLUCONATE CLOTH 2 % EX PADS: 6.0000 | MEDICATED_PAD | Freq: Every day | CUTANEOUS | Status: DC

## 2020-01-25 MED ORDER — FENTANYL CITRATE (PF) 100 MCG/2ML IJ SOLN
INTRAMUSCULAR | Status: AC
  Filled 2020-01-25: qty 2

## 2020-01-25 MED ORDER — HEPARIN (PORCINE) IN NACL 1000-0.9 UT/500ML-% IV SOLN
INTRAVENOUS | Status: AC
  Filled 2020-01-25: qty 1000

## 2020-01-25 MED ORDER — HYDRALAZINE HCL 20 MG/ML IJ SOLN: 10.0000 mg | INTRAMUSCULAR | Status: AC | PRN

## 2020-01-25 MED ORDER — MORPHINE SULFATE (PF) 2 MG/ML IV SOLN
INTRAVENOUS | Status: AC
  Filled 2020-01-25: qty 1

## 2020-01-25 MED ORDER — SODIUM CHLORIDE 0.9 % WEIGHT BASED INFUSION: 1.0000 mL/kg/h | INTRAVENOUS | Status: DC

## 2020-01-25 MED ORDER — LABETALOL HCL 5 MG/ML IV SOLN: 10.0000 mg | INTRAVENOUS | Status: AC | PRN

## 2020-01-25 MED ORDER — MORPHINE SULFATE (PF) 2 MG/ML IV SOLN
2.0000 mg | Freq: Once | INTRAVENOUS | Status: AC
  Administered 2020-01-25: 2 mg via INTRAVENOUS

## 2020-01-25 MED ORDER — MIDAZOLAM HCL 2 MG/2ML IJ SOLN
INTRAMUSCULAR | Status: AC
  Filled 2020-01-25: qty 2

## 2020-01-25 MED ORDER — AMIODARONE HCL IN DEXTROSE 360-4.14 MG/200ML-% IV SOLN: 30.0000 mg/h | INTRAVENOUS | Status: DC

## 2020-01-25 MED ORDER — HEPARIN (PORCINE) 25000 UT/250ML-% IV SOLN
2400.0000 [IU]/h | INTRAVENOUS | Status: DC
Start: 2020-01-25 — End: 2020-04-13

## 2020-01-25 MED ORDER — IOHEXOL 300 MG/ML  SOLN
INTRAMUSCULAR | Status: DC | PRN
  Administered 2020-01-25: 89 mL

## 2020-01-25 MED ORDER — FENTANYL CITRATE (PF) 100 MCG/2ML IJ SOLN
INTRAMUSCULAR | Status: DC | PRN
  Administered 2020-01-25: 50 ug via INTRAVENOUS

## 2020-01-25 MED ORDER — SODIUM CHLORIDE 0.9% FLUSH: 3.0000 mL | Freq: Two times a day (BID) | INTRAVENOUS | Status: DC

## 2020-01-25 SURGICAL SUPPLY — 17 items
CATH 5F 110X4 TIG (CATHETERS) IMPLANT
CATH INFINITI 5FR ANG PIGTAIL (CATHETERS) ×3 IMPLANT
CATH INFINITI 5FR JL4 (CATHETERS) ×3 IMPLANT
CATH INFINITI 5FR JL5 (CATHETERS) ×3 IMPLANT
CATH INFINITI JR4 5F (CATHETERS) ×3 IMPLANT
DEVICE CLOSURE MYNXGRIP 5F (Vascular Products) ×3 IMPLANT
GLIDESHEATH SLEND SS 6F .021 (SHEATH) ×3 IMPLANT
GUIDEWIRE INQWIRE 1.5J.035X260 (WIRE) ×1 IMPLANT
INQWIRE 1.5J .035X260CM (WIRE) ×3
NEEDLE PERC 18GX7CM (NEEDLE) ×3 IMPLANT
PACK CARDIAC CATH (CUSTOM PROCEDURE TRAY) ×3 IMPLANT
PROTECTION STATION PRESSURIZED (MISCELLANEOUS) ×3
SET ATX SIMPLICITY (MISCELLANEOUS) ×3 IMPLANT
SHEATH AVANTI 5FR X 11CM (SHEATH) ×3 IMPLANT
STATION PROTECTION PRESSURIZED (MISCELLANEOUS) ×1 IMPLANT
TUBING CIL FLEX 10 FLL-RA (TUBING) ×3 IMPLANT
WIRE GUIDERIGHT .035X150 (WIRE) ×3 IMPLANT

## 2020-01-25 NOTE — Progress Notes (Signed)
Pt now resting quietly pain free. Pain next for cath lab.

## 2020-01-25 NOTE — Consult Note (Signed)
ANTICOAGULATION CONSULT NOTE  Pharmacy Consult for Heparin Infusion Indication: chest pain/ACS  Patient Measurements: Height: 6\' 2"  (188 cm) Weight: (!) 164.5 kg (362 lb 9.6 oz) IBW/kg (Calculated) : 82.2 Heparin Dosing Weight: 121 kg  Labs: Recent Labs    01/23/20 2022 01/23/20 2022 01/23/20 2201 01/23/20 2218 01/24/20 0118 01/24/20 0118 01/24/20 0315 01/24/20 5366 01/24/20 4403 01/24/20 0855 01/24/20 1824 01/25/20 0504 01/25/20 1558  HGB 10.8*   < >  --   --   --   --   --  10.1*  --   --   --  10.3*  --   HCT 34.5*  --   --   --   --   --   --  32.3*  --   --   --  32.6*  --   PLT 209  --   --   --   --   --   --  188  --   --   --  201  --   APTT  --    < >  --  36  --   --   --  49*   < >  --  42* 54* 32  LABPROT  --   --   --  14.9  --   --   --   --   --   --   --   --   --   INR  --   --   --  1.2  --   --   --   --   --   --   --   --   --   HEPARINUNFRC  --   --   --  3.60*  --   --   --  2.70*  --   --   --  1.18*  --   CREATININE 4.21*   < >  --   --  4.37*  --   --  4.52*  --   --   --  5.13*  --   TROPONINIHS 247*   < >   < >  --  3,987*   < > 4,568* 5,554*  --  5,455*  --   --   --    < > = values in this interval not displayed.    Estimated Creatinine Clearance: 27.7 mL/min (A) (by C-G formula based on SCr of 5.13 mg/dL (H)).   Medical History: Past Medical History:  Diagnosis Date  . CAD (coronary artery disease)   . Chronic systolic heart failure ([place])   . Diabetes mellitus without complication ([place])   . Heart attack ([place])   . Hyperlipidemia   . Hypertension   . Ischemic cardiomyopathy   . Paroxysmal atrial fibrillation ([name])   . Renal disorder     Medications:  Apixaban 5 mg BID (last dose 10/25 at 1830 but patient reports throwing medication back up)  Assessment: Patient is a 49 y/o M with medical history including CAD, heart attack, atrial fibrillation on apixaban, ESRD on HD who presented to the ED 10/25 with chief complaint of chest  pain refractory to SL nitroglycerin. EKG pending. Troponin elevated to 247. Pharmacy has been consulted to initiate heparin infusion for ACS.  Baseline CBC with Hgb 10.8 which appears c/w baseline. Platelets within normal limits. Baseline anti-Xa level (3.6), aPTT (36), and PT-INR (1.2).   10/26 0632 aPTT 49s @ 1600 units/hr (HL 2.70 - levels do no correlate) 10/26  1824 aPTT 42s @ 1900 units/hr 10/27 0504 aPTT 54s @ 2400 units/hr  Goal of Therapy:  Heparin level 0.3-0.7 units/ml aPTT 66 - 102 seconds Monitor platelets by anticoagulation protocol: Yes   Plan:  --10/27 1700 Patient is post cath. Resume Heparin drip at 2400 units/hr without bolusing. Plan to transfer to [facility] when bed available. --Recheck aPTT at 2300 --HL from this morning was 1.18, still elevated. Continue to follow aPTT until correlation. --Daily CBC per protocol; patient on Brilinta + apixaban prior to admission. ESRD on HD, suspect prolonged clearance of apixaban  Paulina Fusi, PharmD, BCPS 01/25/2020 5:02 PM

## 2020-01-25 NOTE — Progress Notes (Signed)
[facility]  ROUNDING NOTE   Subjective:  Patient well-known to [place] from prior admissions. Has ESRD and performs dialysis on [name] schedule. Came in with significant amount of chest pain. Underwent cardiac catheterization today and found to have multivessel coronary disease. Transfer to [facility] has been suggested. Patient has right internal jugular permcath.    Objective:  Vital signs in last 24 hours:  Temp:  [97.7 F (36.5 C)-98.6 F (37 C)] 98.6 F (37 C) (10/27 1531) Pulse Rate:  [68-81] 72 (10/27 1531) Resp:  [12-39] 19 (10/27 1531) BP: (115-174)/(54-109) 132/74 (10/27 1531) SpO2:  [84 %-100 %] 100 % (10/27 1531) Weight:  [164.5 kg-165 kg] 164.5 kg (10/27 1531)  Weight change:  Filed Weights   01/23/20 2021 01/25/20 0834 01/25/20 1531  Weight: (!) 164 kg (!) 165 kg (!) 164.5 kg    Intake/Output: I/O last 3 completed shifts: In: -  Out: 1050 [Urine:1050]   Intake/Output this shift:  No intake/output data recorded.  Physical Exam: General:  No acute distress  Head:  Normocephalic, atraumatic. Moist oral mucosal membranes  Eyes:  Anicteric  Neck:  Supple  Lungs:   Clear to auscultation, normal effort  Heart:  S1S2 no rubs  Abdomen:   Soft, nontender, bowel sounds present  Extremities:  Trace peripheral edema.  Neurologic:  Awake, alert, following commands  Skin:  No lesions  Access:  Right internal jugular PermCath    Basic Metabolic Panel: Recent Labs  Lab 01/23/20 2022 01/23/20 2022 01/24/20 0118 01/24/20 0632 01/25/20 0504 01/25/20 1558  NA 136  --  137 137 136  --   K 3.7  --  3.6 3.8 4.4  --   CL 103  --  104 104 103  --   CO2 25  --  24 24 23   --   GLUCOSE 256*  --  215* 260* 224*  --   BUN 25*  --  27* 29* 34*  --   CREATININE 4.21*  --  4.37* 4.52* 5.13*  --   CALCIUM 7.5*   < > 7.8* 7.8* 8.2*  --   MG  --   --  1.9  --  2.0  --   PHOS  --   --   --   --   --  4.6   < > = values in this interval not displayed.     Liver Function Tests: No results for input(s): AST, ALT, ALKPHOS, BILITOT, PROT, ALBUMIN in the last 168 hours. No results for input(s): LIPASE, AMYLASE in the last 168 hours. No results for input(s): AMMONIA in the last 168 hours.  CBC: Recent Labs  Lab 01/23/20 2022 01/24/20 0632 01/25/20 0504  WBC 9.7 9.0 11.1*  NEUTROABS  --   --  8.6*  HGB 10.8* 10.1* 10.3*  HCT 34.5* 32.3* 32.6*  MCV 88.5 88.5 88.1  PLT 209 188 201    Cardiac Enzymes: No results for input(s): CKTOTAL, CKMB, CKMBINDEX, TROPONINI in the last 168 hours.  BNP: Invalid input(s): POCBNP  CBG: Recent Labs  Lab 01/24/20 1205 01/24/20 1737 01/24/20 2147 01/24/20 2340 01/25/20 0831  GLUCAP 189* 129* 89 113* 159*    Microbiology: Results for orders placed or performed during the hospital encounter of 01/23/20  Respiratory Panel by RT PCR (Flu A&B, Covid) - Nasopharyngeal Swab     Status: None   Collection Time: 01/23/20 10:01 PM   Specimen: Nasopharyngeal Swab  Result Value Ref Range Status   SARS Coronavirus  2 by RT PCR NEGATIVE NEGATIVE Final    Comment: (NOTE) SARS-CoV-2 target nucleic acids are NOT DETECTED.  The SARS-CoV-2 RNA is generally detectable in upper respiratoy specimens during the acute phase of infection. The lowest concentration of SARS-CoV-2 viral copies this assay can detect is 131 copies/mL. A negative result does not preclude SARS-Cov-2 infection and should not be used as the sole basis for treatment or other patient management decisions. A negative result [place] occur with  improper specimen collection/handling, submission of specimen other than nasopharyngeal swab, presence of viral mutation(s) within the areas targeted by this assay, and inadequate number of viral copies (<131 copies/mL). A negative result must be combined with clinical observations, patient history, and epidemiological information. The expected result is Negative.  Fact Sheet for Patients:   PinkCheek.be  Fact Sheet for Healthcare Providers:  GravelBags.it  This test is no t yet approved or cleared by the [place] FDA and  has been authorized for detection and/or diagnosis of SARS-CoV-2 by FDA under an Emergency Use Authorization (EUA). This EUA will remain  in effect (meaning this test can be used) for the duration of the COVID-19 declaration under Section 564(b)(1) of the Act, 21 U.S.C. section 360bbb-3(b)(1), unless the authorization is terminated or revoked sooner.     Influenza A by PCR NEGATIVE NEGATIVE Final   Influenza B by PCR NEGATIVE NEGATIVE Final    Comment: (NOTE) The Xpert Xpress SARS-CoV-2/FLU/RSV assay is intended as an aid in  the diagnosis of influenza from Nasopharyngeal swab specimens and  should not be used as a sole basis for treatment. Nasal washings and  aspirates are unacceptable for Xpert Xpress SARS-CoV-2/FLU/RSV  testing.  Fact Sheet for Patients: PinkCheek.be  Fact Sheet for Healthcare Providers: GravelBags.it  This test is not yet approved or cleared by the [place] FDA and  has been authorized for detection and/or diagnosis of SARS-CoV-2 by  FDA under an Emergency Use Authorization (EUA). This EUA will remain  in effect (meaning this test can be used) for the duration of the  Covid-19 declaration under Section 564(b)(1) of the Act, 21  U.S.C. section 360bbb-3(b)(1), unless the authorization is  terminated or revoked. Performed at [facility], [address] [place], [place] [address]     Coagulation Studies: Recent Labs    01/23/20 06-29-16  LABPROT 14.9  INR 1.2    Urinalysis: No results for input(s): COLORURINE, LABSPEC, PHURINE, GLUCOSEU, HGBUR, BILIRUBINUR, KETONESUR, PROTEINUR, UROBILINOGEN, NITRITE, LEUKOCYTESUR in the last 72 hours.  Invalid input(s): APPERANCEUR    Imaging: DG  Chest 2 View  Result Date: 01/23/2020 CLINICAL DATA:  Chest pain. EXAM: CHEST - 2 VIEW COMPARISON:  January 13, 2020 FINDINGS: There is a dual lead AICD. Stable right-sided venous catheter positioning is noted. The cardiac silhouette is mildly enlarged. Both lungs are clear. Multiple chronic left-sided rib fractures are seen. IMPRESSION: No active cardiopulmonary disease. Electronically Signed   By: Virgina Norfolk M.D.   On: 01/23/2020 20:39   CARDIAC CATHETERIZATION  Result Date: 01/25/2020  Ost LM to Mid LM lesion is 80% stenosed.  Ost LAD lesion is 90% stenosed.  Prox LAD to Mid LAD lesion is 70% stenosed.  Previously placed Dist RCA stent (unknown type) is widely patent.  RPAV lesion is 50% stenosed.  Previously placed Prox RCA stent (unknown type) is widely patent.  1.  Diffuse 80% stenosis left main, 90% stenosis ostial LAD 2.  Severe dilated cardiomyopathy, with LVEF 15 to 20% Recommendations 1.  Complex,  high risk PCI versus CABG     Medications:   . sodium chloride    . sodium chloride 1 mL/kg/hr (01/25/20 1519)  . amiodarone 30 mg/hr (01/25/20 1623)  . heparin Stopped (01/25/20 0859)  . nitroGLYCERIN Stopped (01/25/20 0117)   . allopurinol  100 mg Oral BID  . amLODipine  10 mg Oral Daily  . atorvastatin  80 mg Oral QPM  . bumetanide  2 mg Oral BID  . carvedilol  3.125 mg Oral See admin instructions  . cholecalciferol  5,000 Units Oral Daily  . ezetimibe  10 mg Oral Daily  . FLUoxetine  60 mg Oral QHS  . gabapentin  300 mg Oral TID  . insulin aspart  0-20 Units Subcutaneous TID WC  . insulin aspart  0-5 Units Subcutaneous QHS  . insulin aspart  20 Units Subcutaneous TID WC  . insulin detemir  40 Units Subcutaneous QHS  . isosorbide mononitrate  30 mg Oral Daily  . metoprolol succinate  100 mg Oral Daily  . metoprolol tartrate  5 mg Intravenous Once  . morphine      . multivitamin with minerals  1 tablet Oral Daily  . pantoprazole  40 mg Oral Daily  . [START ON  01/29/2020] Semaglutide(0.25 or 0.5MG /DOS)  0.5 mg Subcutaneous Q7 days  . sevelamer carbonate  1,600 mg Oral TID WC  . sodium chloride flush  3 mL Intravenous Q12H  . sodium chloride flush  3 mL Intravenous Q12H  . ticagrelor  90 mg Oral BID   sodium chloride, acetaminophen, acetaminophen, albuterol, ALPRAZolam, diclofenac Sodium, fentaNYL (SUBLIMAZE) injection, hydrALAZINE, labetalol, labetalol, nitroGLYCERIN, ondansetron (ZOFRAN) IV, ondansetron (ZOFRAN) IV, ondansetron, sodium chloride flush  Assessment/ Plan:  49 y.o. male with past medical history of multivessel coronary artery disease, chronic systolic heart failure, diabetes mellitus type 2, myocardial infarction, hyperlipidemia, hypertension, ischemic cardiomyopathy, paroxysmal atrial fibrillation, ESRD on HD [name], anemia of chronic kidney disease, secondary hyperparathyroidism who was admitted with chest pain.  [facility]/[name]  1.  ESRD on HD [name].  Patient due for hemodialysis treatment today.  Orders have been prepared.  He will need to continue maintenance dialysis treatments at [facility] as well.  2.  Anemia of chronic kidney disease.  Hemoglobin currently 10.3.  Hold off on Epogen at this time.  3.  Secondary hyperparathyroidism.  Serum phosphorus currently at target of 4.6.  Maintain the patient on Renvela 1600 mg p.o. 3 times daily with meals.  4.  Diabetes mellitus type 2 with chronic kidney disease.  Glycemic control as per hospitalist.  5.  Chest pain/multivessel coronary disease.  Cardiology input noted.  They have recommended transfer to [facility] for additional evaluation and intervention as he is considered to be high risk PCI.   LOS: 2 Jashun Puertas 10/27/20214:40 PM

## 2020-01-25 NOTE — Progress Notes (Signed)
Pt wife is back at bedside. Pt is eating a sandwich tray and drinking ginger ale.

## 2020-01-25 NOTE — Progress Notes (Signed)
Pt starts c/o pain again but is not communicating with staff. Wife is at bedside. Pt is staring at the ceiling and will not answer questions. Sternal rub does not change patient's affect. Wife sent out of the room. Pt is blinking and squeezing this RN hand. Pt vitals are at baseline. Asked pt if he was experiencing an anxiety attack. Pt not answering. Pupils are equal and reactive to light but sluggish. Pt moaning slightly. Dr Saralyn Pilar notified. Pt begins to answer staff now and states he needs to sit up. Pt is unable to sit up until 1350 per procedural policy. Pt asking for wife. Advised pt that we would let her back once he was more stable in behavior and pain level. Pt verbalized understanding. Pt affect and behavior returning to baseline.

## 2020-01-25 NOTE — Progress Notes (Signed)
Dr Saralyn Pilar to the bedside. Pt is post procedure and is anxious and c/o 5/10 chest pain. Pt is still sedated from procedure. Wife at bedside.

## 2020-01-25 NOTE — Progress Notes (Signed)
Cardiac catheterization completed today.  This revealed progression of disease in his left main LAD.  Immediately post cath patient was somewhat less responsive however now is completely back to baseline.  Alert and oriented both with motor and sensory skills.  No symptoms whatsoever.  Discussed plan to transfer to [facility] for consideration for PCI.  Patient and wife understand this and are completely in favor.  Cardiac catheterization revealed an 80% stenosis of the ostial to mid left main, 90% ostial LAD, 70 apparent stenosis in the proximal to mid LAD, previously placed stent in the RCA was widely patent.  RPA V lesion was 50% stenosis with a previously placed proximal RCA stent widely patent.  EF 15 to 20%.  I have discussed with Dr. Denman George at [facility].  Plan to transfer the patient to [facility] for consideration for high risk PCI versus CABG.  Patient will need hemodialysis to continue.  We will continue with IV amiodarone at 30 mg/h, IV nitroglycerin to control blood pressure and chest pain.  Will resume heparin.  N.p.o. after midnight.

## 2020-01-25 NOTE — Progress Notes (Signed)
Pt states he is having 4/10 chest pain on the left side of the chest. Pt has tears in eyes and is holding his breath. Repeat EKG performed and DR Paraschos notified. Pt given morphine for pain. Pt denies any allergy but states he was given too much morphine in the past and that what caused his anxiety reaction. Wife at the bedside.

## 2020-01-25 NOTE — Discharge Summary (Signed)
[facility] [place] NAME: Edwin [name]    MR#:  [id number]  DATE OF BIRTH:  Aug 30, 1970  DATE OF ADMISSION:  01/23/2020 ADMITTING PHYSICIAN: Elwyn Reach, MD  DATE OF DISCHARGE: 01/25/2020  PRIMARY CARE PHYSICIAN: Caryl Never, MD    ADMISSION DIAGNOSIS:  Unstable angina ([place]) [I20.0] Rapid atrial fibrillation ([place]) [I48.91] Atrial fibrillation with rapid ventricular response ([place]) [I48.91] Chest pain [R07.9]  DISCHARGE DIAGNOSIS:  Principal Problem:   Rapid atrial fibrillation ([name]) Active Problems:   NSTEMI (non-ST elevated myocardial infarction) ([name])   Chest pain   Syncope and collapse   ESRD (end stage renal disease) ([place])   CAD (coronary artery disease)   Diabetes ([place])   Essential hypertension   Hyperlipidemia   SECONDARY DIAGNOSIS:   Past Medical History:  Diagnosis Date   CAD (coronary artery disease)    Chronic systolic heart failure ([name])    Diabetes mellitus without complication ([place])    Heart attack ([place])    Hyperlipidemia    Hypertension    Ischemic cardiomyopathy    Paroxysmal atrial fibrillation ([place])    Renal disorder     HOSPITAL COURSE:   1.  Acute non-ST elevation myocardial infarction.  The patient was taken to the cardiac Cath Lab by Dr. Ubaldo Glassing on 01/25/2020.  Results showed diffuse 80% stenosis left main, 90% stenosis ostial LAD and severe dilated cardiomyopathy with an EF around 20%.  Dr. Ubaldo Glassing discussed case with cardiology [facility] at [facility] and patient will be [facility] for high risk stent versus bypass.  Patient on aspirin, Toprol, Lipitor.  Brilinta on hold just in case bypass needed.  Eliquis on hold.  And restart heparin drip once 6 hours from cardiac cath today. 2.  Atrial fibrillation with rapid ventricular response.  Continue amiodarone drip 3.  End-stage renal disease.  Nephrology consulted.  Patient will likely end up needing dialysis.  Normal days Monday Wednesday  Friday. 4.  Type 2 diabetes mellitus with hyperglycemia.  Continue detemir insulin and short acting insulin prior to meals 5.  Morbid obesity with a BMI of 46.7 6.  Chronic systolic congestive heart failure dialysis to manage fluid. 7.  Patient did take a little while to come out of the sedation of the cardiac cath.  Patient's mental status now better after medications from the cardiac cath wore off.  DISCHARGE CONDITIONS:   [place]  CONSULTS OBTAINED:  Treatment Team:  Teodoro Spray, MD  DRUG ALLERGIES:   Allergies  Allergen Reactions   Losartan     Hyperkalemia (K>6)   Morphine Anxiety   Morphine And Related Other (See Comments)    Overdose in hospital per patient report    DISCHARGE MEDICATIONS:   Allergies as of 01/25/2020      Reactions   Losartan    Hyperkalemia (K>6)   Morphine Anxiety   Morphine And Related Other (See Comments)   Overdose in hospital per patient report      Medication List    STOP taking these medications   Alirocumab 150 MG/ML Soaj   amiodarone 200 MG tablet Commonly known as: PACERONE   Brilinta 90 MG Tabs tablet Generic drug: ticagrelor   carvedilol 3.125 MG tablet Commonly known as: COREG   Eliquis 5 MG Tabs tablet Generic drug: apixaban   Ozempic (0.25 or 0.5 MG/DOSE) 2 MG/1.5ML Sopn Generic drug: Semaglutide(0.25 or 0.5MG /DOS)     TAKE these medications   albuterol 108 (90 Base) MCG/ACT inhaler  Commonly known as: VENTOLIN HFA Inhale 2 puffs into the lungs every 6 (six) hours as needed for wheezing or shortness of breath.   allopurinol 100 MG tablet Commonly known as: ZYLOPRIM Take 100 mg by mouth 2 (two) times daily.   amiodarone 360-4.14 MG/200ML-% Soln Commonly known as: NEXTERONE PREMIX Inject 30 mg/hr into the vein continuous.   amLODipine 10 MG tablet Commonly known as: NORVASC Take 10 mg by mouth daily.   aspirin 81 MG chewable tablet Chew 1 tablet (81 mg total) by mouth before cath procedure. Start  taking on: January 26, 2020   atorvastatin 80 MG tablet Commonly known as: LIPITOR Take 80 mg by mouth every evening.   bumetanide 2 MG tablet Commonly known as: BUMEX Take 2 mg by mouth 2 (two) times daily.   cholecalciferol 25 MCG (1000 UNIT) tablet Commonly known as: VITAMIN D Take 5,000 Units by mouth daily.   ezetimibe 10 MG tablet Commonly known as: ZETIA Take 10 mg by mouth daily.   FLUoxetine 20 MG capsule Commonly known as: PROZAC Take 60 mg by mouth at bedtime.   gabapentin 300 MG capsule Commonly known as: NEURONTIN Take 300 mg by mouth 2 (two) times a day.   insulin aspart 100 UNIT/ML injection Commonly known as: novoLOG Inject 20 Units into the skin 3 (three) times daily with meals.   insulin detemir 100 UNIT/ML injection Commonly known as: LEVEMIR Inject 0.4 mLs (40 Units total) into the skin at bedtime.   isosorbide mononitrate 30 MG 24 hr tablet Commonly known as: IMDUR Take 1 tablet (30 mg total) by mouth daily.   metoprolol succinate 100 MG 24 hr tablet Commonly known as: TOPROL-XL Take 100 mg by mouth daily.   multivitamin with minerals Tabs tablet Take 1 tablet by mouth daily.   nitroGLYCERIN 0.4 MG SL tablet Commonly known as: NITROSTAT Place 0.4 mg under the tongue every 5 (five) minutes as needed for chest pain.   omeprazole 40 MG capsule Commonly known as: PRILOSEC Take 40 mg by mouth 2 (two) times daily.   ondansetron 4 MG tablet Commonly known as: ZOFRAN Take 4 mg by mouth every 12 (twelve) hours as needed for nausea.   Renvela 800 MG tablet Generic drug: sevelamer carbonate Take 1,600 mg by mouth 3 (three) times daily with meals.   Voltaren 1 % Gel Generic drug: diclofenac Sodium Apply 2 g topically 3 (three) times daily as needed (left leg pain).        DISCHARGE INSTRUCTIONS:   Follow-up team at [facility] 1 day  If you experience worsening of your admission symptoms, develop shortness of breath, life threatening  emergency, suicidal or homicidal thoughts you must seek medical attention immediately by calling 911 or calling your MD immediately  if symptoms less severe.  You Must read complete instructions/literature along with all the possible adverse reactions/side effects for all the Medicines you take and that have been prescribed to you. Take any new Medicines after you have completely understood and accept all the possible adverse reactions/side effects.   Please note  You were cared for by a hospitalist during your hospital stay. If you have any questions about your discharge medications or the care you received while you were in the hospital after you are discharged, you can call the unit and asked to speak with the hospitalist on call if the hospitalist that took care of you is not available. Once you are discharged, your primary care physician will handle any further medical issues.  Please note that NO REFILLS for any discharge medications will be authorized once you are discharged, as it is imperative that you return to your primary care physician (or establish a relationship with a primary care physician if you do not have one) for your aftercare needs so that they can reassess your need for medications and monitor your lab values.    Today   CHIEF COMPLAINT:   Chief Complaint  Patient presents with   Chest Pain    HISTORY OF PRESENT ILLNESS:  Edwin [name]  is a 49 y.o. male came in with chest pain and found to have a myocardial infarction   VITAL SIGNS:  Blood pressure (!) 150/91, pulse 70, temperature 97.7 F (36.5 C), temperature source Oral, resp. rate 16, height 6\' 2"  (1.88 m), weight (!) 165 kg, SpO2 98 %.  I/O:  No intake or output data in the 24 hours ending 01/25/20 1450  PHYSICAL EXAMINATION:  GENERAL:  49 y.o.-year-old patient lying in the bed with no acute distress.  EYES: Pupils equal, round, reactive to light and accommodation. No scleral icterus. Extraocular muscles  intact.  HEENT: Head atraumatic, normocephalic. Oropharynx and nasopharynx clear.  LUNGS: Decreased breath sounds bilaterally, no wheezing, rales,rhonchi or crepitation. No use of accessory muscles of respiration.  CARDIOVASCULAR: S1, S2 normal. No murmurs, rubs, or gallops.  ABDOMEN: Soft, non-tender, non-distended.  EXTREMITIES: Trace pedal edema.  NEUROLOGIC: Cranial nerves II through XII are intact. Muscle strength 5/5 in all extremities. Sensation intact. Gait not checked.  PSYCHIATRIC: The patient is alert and oriented x 3.  SKIN: No obvious rash, lesion, or ulcer.   DATA REVIEW:   CBC Recent Labs  Lab 01/25/20 0504  WBC 11.1*  HGB 10.3*  HCT 32.6*  PLT 201    Chemistries  Recent Labs  Lab 01/25/20 0504  NA 136  K 4.4  CL 103  CO2 23  GLUCOSE 224*  BUN 34*  CREATININE 5.13*  CALCIUM 8.2*  MG 2.0    Cardiac Enzymes High-sensitivity troponin elevated at 5554 and 5455.  Microbiology Results  Results for orders placed or performed during the hospital encounter of 01/23/20  Respiratory Panel by RT PCR (Flu A&B, Covid) - Nasopharyngeal Swab     Status: None   Collection Time: 01/23/20 10:01 PM   Specimen: Nasopharyngeal Swab  Result Value Ref Range Status   SARS Coronavirus 2 by RT PCR NEGATIVE NEGATIVE Final    Comment: (NOTE) SARS-CoV-2 target nucleic acids are NOT DETECTED.  The SARS-CoV-2 RNA is generally detectable in upper respiratoy specimens during the acute phase of infection. The lowest concentration of SARS-CoV-2 viral copies this assay can detect is 131 copies/mL. A negative result does not preclude SARS-Cov-2 infection and should not be used as the sole basis for treatment or other patient management decisions. A negative result [place] occur with  improper specimen collection/handling, submission of specimen other than nasopharyngeal swab, presence of viral mutation(s) within the areas targeted by this assay, and inadequate number of viral  copies (<131 copies/mL). A negative result must be combined with clinical observations, patient history, and epidemiological information. The expected result is Negative.  Fact Sheet for Patients:  PinkCheek.be  Fact Sheet for Healthcare Providers:  GravelBags.it  This test is no t yet approved or cleared by the [place] FDA and  has been authorized for detection and/or diagnosis of SARS-CoV-2 by FDA under an Emergency Use Authorization (EUA). This EUA will remain  in effect (meaning this test can be used)  for the duration of the COVID-19 declaration under Section 564(b)(1) of the Act, 21 U.S.C. section 360bbb-3(b)(1), unless the authorization is terminated or revoked sooner.     Influenza A by PCR NEGATIVE NEGATIVE Final   Influenza B by PCR NEGATIVE NEGATIVE Final    Comment: (NOTE) The Xpert Xpress SARS-CoV-2/FLU/RSV assay is intended as an aid in  the diagnosis of influenza from Nasopharyngeal swab specimens and  should not be used as a sole basis for treatment. Nasal washings and  aspirates are unacceptable for Xpert Xpress SARS-CoV-2/FLU/RSV  testing.  Fact Sheet for Patients: PinkCheek.be  Fact Sheet for Healthcare Providers: GravelBags.it  This test is not yet approved or cleared by the [place] FDA and  has been authorized for detection and/or diagnosis of SARS-CoV-2 by  FDA under an Emergency Use Authorization (EUA). This EUA will remain  in effect (meaning this test can be used) for the duration of the  Covid-19 declaration under Section 564(b)(1) of the Act, 21  U.S.C. section 360bbb-3(b)(1), unless the authorization is  terminated or revoked. Performed at [facility], [address] [place], [place] [address]     RADIOLOGY:  DG Chest 2 View  Result Date: 01/23/2020 CLINICAL DATA:  Chest pain. EXAM: CHEST - 2 VIEW  COMPARISON:  January 13, 2020 FINDINGS: There is a dual lead AICD. Stable right-sided venous catheter positioning is noted. The cardiac silhouette is mildly enlarged. Both lungs are clear. Multiple chronic left-sided rib fractures are seen. IMPRESSION: No active cardiopulmonary disease. Electronically Signed   By: Virgina Norfolk M.D.   On: 01/23/2020 20:39   CARDIAC CATHETERIZATION  Result Date: 01/25/2020  Ost LM to Mid LM lesion is 80% stenosed.  Ost LAD lesion is 90% stenosed.  Prox LAD to Mid LAD lesion is 70% stenosed.  Previously placed Dist RCA stent (unknown type) is widely patent.  RPAV lesion is 50% stenosed.  Previously placed Prox RCA stent (unknown type) is widely patent.  1.  Diffuse 80% stenosis left main, 90% stenosis ostial LAD 2.  Severe dilated cardiomyopathy, with LVEF 15 to 20% Recommendations 1.  Complex, high risk PCI versus CABG    Management plans discussed with the patient, family and they are in agreement.  CODE STATUS:     Code Status Orders  (From admission, onward)         Start     Ordered   01/24/20 0014  Full code  Continuous        01/24/20 0014        Code Status History    Date Active Date Inactive Code Status Order ID Comments User Context   01/14/2020 0129 01/14/2020 1846 Full Code [id number]  Christel Mormon, MD ED   10/28/2018 1747 10/30/2018 1959 Full Code [id number]  Dustin Flock, MD Inpatient   07/08/2018 2025 07/09/2018 1521 Full Code [id number]  Dustin Flock, MD Inpatient   03/08/2018 2246 03/11/2018 2257 Full Code [id number]  Salary, Avel Peace, MD Inpatient   12/16/2017 2226 12/17/2017 1550 Full Code [id number]  Thornton Park, MD Inpatient   Advance Care Planning Activity    Advance Directive Documentation     Most Recent Value  Type of Advance Directive Living will, Healthcare Power of Attorney  Pre-existing out of facility DNR order (yellow form or pink MOST form) --  "MOST" Form in Place? --      TOTAL TIME TAKING CARE OF THIS  PATIENT: 45 minutes.    Loletha Grayer M.D on 01/25/2020 at 2:50  PM  Between 7am to 6pm - Pager - [phone]  After 6pm go to [email or web address] - password [email or web address]  [facility]  CC: Primary care physician; Caryl Never, MD

## 2020-01-25 NOTE — Progress Notes (Signed)
Pt transported via EMS to [facility] for continuation of care. Pt sent with all personal belongings.

## 2020-01-25 NOTE — Progress Notes (Signed)
[facility] Cardiology    SUBJECTIVE: [name] is a 49 year old male with a past medical history significant for atrial fibrillation, anticoagulated with Eliquis and on amiodarone, coronary artery disease s/p PCI to the mid RPLA/RPDA bifurfaction, PCI to the proximal to mid LAD, and PCI tp the distal RCA, chronic HFrEF with ICD in place, end stage renal disease on hemodialysis, and hypertension who presented to the ED on 01/23/20 for mid-sternal chest pain, radiating to bilateral arms and the jaw, with associated diaphoresis.  While in the ED, he developed atrial fibrillation with RVR and cardiology was consulted.  High sensitivity troponin was elevated, 247, 1111, 3987, 4568, 5554, and 5455 respectively.   [name] is followed in outpatient cardiology at [facility] by Dr. Kerry Dory.  Echocardiogram on 09/22/19 revealed moderately to severely reduced LV systolic function with an EF estimated at 35% with grade 3 diastolic dysfunction, reduced RV systolic function. Most recent [facility] reports are below:   01/25/20: Continues to admit to chest pressure, appears uncomfortable.  In and out of sleep during exam.    10/22/18 1. Significant 90% stenosis of distal RCA, severe proximal LCx stenosis, and diffuse severe distal LAD stenosis 2. Elevated LVEDP = 22  3. Successful DES to the distal RCA with the placement of a Synergy 4.0 x 20 with excellent angiographic result and TIMI 3 flow.  05/12/18 5. Coronary artery disease including diffuse moderate to severe lesions in the left coronary system, with particular emphasis on the 90% proximal to mid LAD in-stent restenosis. 6. Successful PCI to the proximal to mid LAD with the placement of a Synergy drug eluting stent with excellent angiographic result and TIMI 3 flow. 7. Subsequent impaired flow at an adjacent moderate diagonal, which was small and had pre-existing ostial disease. Recrossed and treated with balloon angioplasty with restored flow. 8. Recent stent to RPDA/RPLA  bifurcation widely patent. 9. Normal left ventricular filling pressures (LVEDP = 8 mm Hg).  03/12/18 1. Coronary artery disease includingsevere mid RCA, and distal RCA bifurcation stenosis. 2. Left heart cath deferred 3. SuccessfulPCIto the mid RCA and RPLA/RPDA bifurcationwith the placement of a Synergy DES xwith excellent angiographic result and TIMI 3 flow.     Vitals:   01/25/20 0400 01/25/20 0415 01/25/20 0515 01/25/20 0615  BP: (!) 128/54 (!) 155/97 (!) 164/88 (!) 155/85  Pulse: 68 70 69 72  Resp: (!) 34 15 20 (!) 21  Temp:      TempSrc:      SpO2: (!) 89% 90% 100% 96%  Weight:      Height:         Intake/Output Summary (Last 24 hours) at 01/25/2020 0726 Last data filed at 01/24/2020 1150 Gross per 24 hour  Intake --  Output 1050 ml  Net -1050 ml      PHYSICAL EXAM  General: Well developed, morbidly obese, in no acute distress HEENT:  Normocephalic and atramatic Neck:  No JVD.  Lungs: Clear bilaterally to auscultation and percussion. Heart: HRRR . Normal S1 and S2 without gallops or murmurs.  Abdomen: Bowel sounds are positive, abdomen soft and non-tender  Msk:  Back normal. Normal strength and tone for age. Extremities: No clubbing, cyanosis or edema.   Neuro: Alert and oriented X 3. Psych:  Good affect, responds appropriately   LABS: Basic Metabolic Panel: Recent Labs    01/24/20 0118 01/24/20 0118 01/24/20 0632 01/25/20 0504  NA 137   < > 137 136  K 3.6   < >  3.8 4.4  CL 104   < > 104 103  CO2 24   < > 24 23  GLUCOSE 215*   < > 260* 224*  BUN 27*   < > 29* 34*  CREATININE 4.37*   < > 4.52* 5.13*  CALCIUM 7.8*   < > 7.8* 8.2*  MG 1.9  --   --  2.0   < > = values in this interval not displayed.   Liver Function Tests: No results for input(s): AST, ALT, ALKPHOS, BILITOT, PROT, ALBUMIN in the last 72 hours. No results for input(s): LIPASE, AMYLASE in the last 72 hours. CBC: Recent Labs    01/24/20 0632 01/25/20 0504  WBC 9.0 11.1*   NEUTROABS  --  8.6*  HGB 10.1* 10.3*  HCT 32.3* 32.6*  MCV 88.5 88.1  PLT 188 201   Cardiac Enzymes: No results for input(s): CKTOTAL, CKMB, CKMBINDEX, TROPONINI in the last 72 hours. BNP: Invalid input(s): POCBNP D-Dimer: No results for input(s): DDIMER in the last 72 hours. Hemoglobin A1C: No results for input(s): HGBA1C in the last 72 hours. Fasting Lipid Panel: Recent Labs    01/24/20 0632  CHOL 213*  HDL 33*  LDLCALC 133*  TRIG 237*  CHOLHDL 6.5   Thyroid Function Tests: Recent Labs    01/24/20 0118  TSH 7.769*   Anemia Panel: No results for input(s): VITAMINB12, FOLATE, FERRITIN, TIBC, IRON, RETICCTPCT in the last 72 hours.  DG Chest 2 View  Result Date: 01/23/2020 CLINICAL DATA:  Chest pain. EXAM: CHEST - 2 VIEW COMPARISON:  January 13, 2020 FINDINGS: There is a dual lead AICD. Stable right-sided venous catheter positioning is noted. The cardiac silhouette is mildly enlarged. Both lungs are clear. Multiple chronic left-sided rib fractures are seen. IMPRESSION: No active cardiopulmonary disease. Electronically Signed   By: Virgina Norfolk M.D.   On: 01/23/2020 20:39     TELEMETRY: Normal sinus rhythm   ASSESSMENT AND PLAN:  Principal Problem:   Rapid atrial fibrillation ([name]) Active Problems:   NSTEMI (non-ST elevated myocardial infarction) ([name])   Chest pain   Syncope and collapse   ESRD (end stage renal disease) ([name])   CAD (coronary artery disease)   Diabetes ([place])   Essential hypertension   Hyperlipidemia    1.  Coronary artery disease  -With extensive history; LHC scheduled with Dr. Saralyn Pilar today   -Continue atorvastatin, metoprolol succinate, nitroglycerin   2.  Atrial fibrillation   -Currently in NSR; currently on amiodarone drip   -Eliquis held for upcoming LHC; currently on heparin   3.  Chronic kidney disease on hemodialysis   -On hemodialysis   4.  HFrEF  -ICD in place; no recent terminated episodes  -Most recent  interrogation revealing episodes of afib with RVR   The history, physical exam findings, and plan of care were all discussed with Dr. Bartholome Bill, and all decision making was made in collaboration.   Avie Arenas PA-C 01/25/2020 7:26 AM

## 2020-01-25 NOTE — Plan of Care (Signed)
  Problem: Education: Goal: Knowledge of General Education information will improve Description: Including pain rating scale, medication(s)/side effects and non-pharmacologic comfort measures 01/25/2020 1835 by Cristela Blue, RN Outcome: Progressing 01/25/2020 [place] by Cristela Blue, RN Outcome: Progressing   Problem: Health Behavior/Discharge Planning: Goal: Ability to manage health-related needs will improve 01/25/2020 1835 by Cristela Blue, RN Outcome: Progressing 01/25/2020 1835 by Cristela Blue, RN Outcome: Progressing   Problem: Clinical Measurements: Goal: Ability to maintain clinical measurements within normal limits will improve 01/25/2020 1835 by Cristela Blue, RN Outcome: Progressing 01/25/2020 1835 by Cristela Blue, RN Outcome: Progressing Goal: Will remain free from infection 01/25/2020 1835 by Cristela Blue, RN Outcome: Progressing 01/25/2020 [place] by Cristela Blue, RN Outcome: Progressing Goal: Diagnostic test results will improve 01/25/2020 1835 by Cristela Blue, RN Outcome: Progressing 01/25/2020 [place] by Cristela Blue, RN Outcome: Progressing Goal: Respiratory complications will improve 01/25/2020 1835 by Cristela Blue, RN Outcome: Progressing 01/25/2020 [place] by Cristela Blue, RN Outcome: Progressing Goal: Cardiovascular complication will be avoided 01/25/2020 1835 by Cristela Blue, RN Outcome: Progressing 01/25/2020 [place] by Cristela Blue, RN Outcome: Progressing   Problem: Activity: Goal: Risk for activity intolerance will decrease 01/25/2020 1835 by Cristela Blue, RN Outcome: Progressing 01/25/2020 1835 by Cristela Blue, RN Outcome: Progressing   Problem: Nutrition: Goal: Adequate nutrition will be maintained 01/25/2020 1835 by Cristela Blue, RN Outcome: Progressing 01/25/2020 [place] by Cristela Blue, RN Outcome: Progressing   Problem: Coping: Goal: Level of anxiety will decrease 01/25/2020 1835 by Cristela Blue, RN Outcome:  Progressing 01/25/2020 [place] by Cristela Blue, RN Outcome: Progressing   Problem: Elimination: Goal: Will not experience complications related to bowel motility 01/25/2020 1835 by Cristela Blue, RN Outcome: Progressing 01/25/2020 1835 by Cristela Blue, RN Outcome: Progressing Goal: Will not experience complications related to urinary retention 01/25/2020 1835 by Cristela Blue, RN Outcome: Progressing 01/25/2020 [place] by Cristela Blue, RN Outcome: Progressing   Problem: Pain Managment: Goal: General experience of comfort will improve 01/25/2020 1835 by Cristela Blue, RN Outcome: Progressing 01/25/2020 1835 by Cristela Blue, RN Outcome: Progressing   Problem: Safety: Goal: Ability to remain free from injury will improve 01/25/2020 1835 by Cristela Blue, RN Outcome: Progressing 01/25/2020 [place] by Cristela Blue, RN Outcome: Progressing   Problem: Skin Integrity: Goal: Risk for impaired skin integrity will decrease 01/25/2020 1835 by Cristela Blue, RN Outcome: Progressing 01/25/2020 [place] by Cristela Blue, RN Outcome: Progressing

## 2020-01-25 NOTE — Progress Notes (Signed)
This RN gave report to Erlene Quan, Therapist, sports at CICU at [facility] as well as the transport team. All outstanding questions resolved.

## 2020-01-25 NOTE — Consult Note (Signed)
ANTICOAGULATION CONSULT NOTE  Pharmacy Consult for Heparin Infusion Indication: chest pain/ACS  Patient Measurements: Height: 6\' 2"  (188 cm) Weight: (!) 164 kg (361 lb 8.9 oz) IBW/kg (Calculated) : 82.2 Heparin Dosing Weight: 121 kg  Labs: Recent Labs    01/23/20 2022 01/23/20 2022 01/23/20 2201 01/23/20 2218 01/24/20 0118 01/24/20 0118 01/24/20 0315 01/24/20 1610 01/24/20 0855 01/24/20 1824 01/25/20 0504  HGB 10.8*   < >  --   --   --   --   --  10.1*  --   --  10.3*  HCT 34.5*  --   --   --   --   --   --  32.3*  --   --  32.6*  PLT 209  --   --   --   --   --   --  188  --   --  201  APTT  --    < >  --  36  --   --   --  49*  --  42* 54*  LABPROT  --   --   --  14.9  --   --   --   --   --   --   --   INR  --   --   --  1.2  --   --   --   --   --   --   --   HEPARINUNFRC  --   --   --  3.60*  --   --   --  2.70*  --   --   --   CREATININE 4.21*   < >  --   --  4.37*  --   --  4.52*  --   --  5.13*  TROPONINIHS 247*   < >   < >  --  3,987*   < > 4,568* 5,554* 5,455*  --   --    < > = values in this interval not displayed.    Estimated Creatinine Clearance: 27.7 mL/min (A) (by C-G formula based on SCr of 5.13 mg/dL (H)).   Medical History: Past Medical History:  Diagnosis Date  . CAD (coronary artery disease)   . Chronic systolic heart failure ([place])   . Diabetes mellitus without complication ([place])   . Heart attack ([place])   . Hyperlipidemia   . Hypertension   . Ischemic cardiomyopathy   . Paroxysmal atrial fibrillation ([name])   . Renal disorder     Medications:  Apixaban 5 mg BID (last dose 10/25 at 1830 but patient reports throwing medication back up)  Assessment: Patient is a 49 y/o M with medical history including CAD, heart attack, atrial fibrillation on apixaban, ESRD on HD who presented to the ED 10/25 with chief complaint of chest pain refractory to SL nitroglycerin. EKG pending. Troponin elevated to 247. Pharmacy has been consulted to initiate heparin  infusion for ACS.  Baseline CBC with Hgb 10.8 which appears c/w baseline. Platelets within normal limits. Baseline anti-Xa level (3.6), aPTT (36), and PT-INR (1.2).   10/26 0632 aPTT 49s @ 1600 units/hr (HL 2.70 - levels do no correlate)  10/26 1824 aPTT 42s @ 1900 units/hr  Goal of Therapy:  Heparin level 0.3-0.7 units/ml aPTT 66 - 102 seconds Monitor platelets by anticoagulation protocol: Yes   Plan:  --10/27 0504 aPTT 54 subtherapeutic. Heparin 3000 unit bolus followed by increase in heparin drip to 2400 units/hr. --Recheck aPTT at 1600 --HL still  pending. Expect likely still elevated. Continue to follow aPTT until correlation. --Daily CBC per protocol; patient on Brilinta + apixaban prior to admission. ESRD on HD, suspect prolonged clearance of apixaban  Tawnya Crook, PharmD Clinical Pharmacist 01/25/2020 6:27 AM

## 2020-01-25 NOTE — Unmapped (Signed)
 CARDIOLOGY TRANSFER CENTER REQUEST NOTE    4:52 PM  01/25/20    Requesting Physician: Dr. Lady Gary  Requesting Hospital: [facility]    Reason for Transfer: NSTEMI, complex coronary artery disease    Hospital Course: [name] is a 49 y.o. male with CAD s/p multiple PCI's with most recent PCI to LAD and left circumflex in 07/2019, ischemic cardiomyopathy with LVEF 35% in 08/2019 s/p [employer] dual-chamber ICD, ESRD on [name] dialysis, OSA on home nightly BiPAP, paroxysmal atrial fibrillation on amiodarone and Eliquis who was admitted at [facility] with NSTEMI.    Admitted on 10/25 with NSTEMI and typical symptoms for him.  Troponin V 554, trended to 5455.  Taken to Cath Lab on 10/27 with Dr. Lady Gary, showing diffuse 80% left main stenosis, 90% ostial LAD and severe dilated cardiomyopathy with EF close to 20%.    Case discussed with Dr. Andrey Farmer and Dr. Lady Gary, who performed cath.  Given proximal disease and need for complex intervention, transferring for advanced intervention.    He is on amiodarone for atrial fibrillation, with heart rates in the 70s, normotensive with blood pressures 150/90 mmHg, saturating well on room air.    Accepting Service: MEDC   Bed Type: CICU  Attending: Milana Na, MD  [facility] Cardiovascular Medicine Fellow

## 2020-01-26 ENCOUNTER — Encounter: Payer: Self-pay | Admitting: Cardiology

## 2020-01-26 LAB — COMPREHENSIVE METABOLIC PANEL
ALBUMIN: 2.5 g/dL — ABNORMAL LOW (ref 3.4–5.0)
ALKALINE PHOSPHATASE: 99 U/L (ref 46–116)
ALT (SGPT): 7 U/L — ABNORMAL LOW (ref 10–49)
ANION GAP: 8 mmol/L (ref 5–14)
BILIRUBIN TOTAL: 0.3 mg/dL (ref 0.3–1.2)
BLOOD UREA NITROGEN: 20 mg/dL (ref 9–23)
BUN / CREAT RATIO: 6
CALCIUM: 7.8 mg/dL — ABNORMAL LOW (ref 8.7–10.4)
CHLORIDE: 106 mmol/L (ref 98–107)
CO2: 25.1 mmol/L (ref 20.0–31.0)
CREATININE: 3.5 mg/dL — ABNORMAL HIGH
EGFR CKD-EPI AA MALE: 22 mL/min/{1.73_m2} — ABNORMAL LOW (ref >=60)
EGFR CKD-EPI NON-AA MALE: 19 mL/min/{1.73_m2} — ABNORMAL LOW (ref >=60)
GLUCOSE RANDOM: 166 mg/dL (ref 70–179)
PROTEIN TOTAL: 6.5 g/dL (ref 5.7–8.2)
SODIUM: 139 mmol/L (ref 135–145)

## 2020-01-26 LAB — APTT
APTT: 186.5 s (ref 24.9–36.9)
APTT: 104.9 s — ABNORMAL HIGH (ref 24.9–36.9)
APTT: 45 s — ABNORMAL HIGH (ref 24.9–36.9)
HEPARIN CORRELATION: 1.1
HEPARIN CORRELATION: 0.6
HEPARIN CORRELATION: 0.3

## 2020-01-26 LAB — CBC
HEMATOCRIT: 34.6 % — ABNORMAL LOW (ref 41.0–53.0)
HEMOGLOBIN: 11 g/dL — ABNORMAL LOW (ref 13.5–17.5)
MEAN CORPUSCULAR HEMOGLOBIN CONC: 31.9 g/dL (ref 31.0–37.0)
MEAN CORPUSCULAR HEMOGLOBIN: 28.4 pg (ref 26.0–34.0)
MEAN CORPUSCULAR VOLUME: 89.3 fL (ref 80.0–100.0)
MEAN PLATELET VOLUME: 9.5 fL (ref 7.0–10.0)
PLATELET COUNT: 215 10*9/L (ref 150–440)
RED BLOOD CELL COUNT: 3.87 10*12/L — ABNORMAL LOW (ref 4.50–5.90)
RED CELL DISTRIBUTION WIDTH: 16 % — ABNORMAL HIGH (ref 12.0–15.0)
WBC ADJUSTED: 10.2 10*9/L (ref 4.5–11.0)

## 2020-01-26 LAB — BASIC METABOLIC PANEL
ANION GAP: 9 mmol/L (ref 5–14)
BLOOD UREA NITROGEN: 23 mg/dL (ref 9–23)
BUN / CREAT RATIO: 6
CALCIUM: 7.9 mg/dL — ABNORMAL LOW (ref 8.7–10.4)
CHLORIDE: 104 mmol/L (ref 98–107)
CO2: 26 mmol/L (ref 20.0–31.0)
CREATININE: 3.87 mg/dL — ABNORMAL HIGH
EGFR CKD-EPI AA MALE: 20 mL/min/{1.73_m2} — ABNORMAL LOW (ref >=60)
EGFR CKD-EPI NON-AA MALE: 17 mL/min/{1.73_m2} — ABNORMAL LOW (ref >=60)
GLUCOSE RANDOM: 252 mg/dL — ABNORMAL HIGH (ref 70–179)
POTASSIUM: 3.6 mmol/L (ref 3.4–4.5)
SODIUM: 139 mmol/L (ref 135–145)

## 2020-01-26 LAB — AST: AST (SGOT): 10 U/L (ref <=34)

## 2020-01-26 LAB — HIGH SENSITIVITY TROPONIN I - SINGLE
HIGH SENSITIVITY TROPONIN I: 2309 ng/L (ref <=53)
HIGH SENSITIVITY TROPONIN I: 2397 ng/L (ref <=53)
HIGH SENSITIVITY TROPONIN I: 2331 ng/L (ref <=53)

## 2020-01-26 LAB — POTASSIUM: POTASSIUM: 3.6 mmol/L (ref 3.4–4.5)

## 2020-01-26 LAB — MAGNESIUM
MAGNESIUM: 1.5 mg/dL — ABNORMAL LOW (ref 1.6–2.6)
MAGNESIUM: 2.6 mg/dL (ref 1.6–2.6)

## 2020-01-26 LAB — TSH: THYROID STIMULATING HORMONE: 8.257 u[IU]/mL — ABNORMAL HIGH (ref 0.550–4.780)

## 2020-01-26 MED ADMIN — amioDAROne 360 mg in dextrose 5% 200 mL (1.8 mg/mL) infusion PMB: .5 mg/min | INTRAVENOUS | @ 04:00:00

## 2020-01-26 MED ADMIN — gentamicin-sodium citrate lock solution in NS: 2.3 mL

## 2020-01-26 MED ADMIN — FLUoxetine (PROzac) capsule 80 mg: 80 mg | ORAL | @ 17:00:00

## 2020-01-26 MED ADMIN — amiodarone (PACERONE) tablet 200 mg: 200 mg | ORAL | @ 17:00:00

## 2020-01-26 MED ADMIN — sevelamer (RENVELA) tablet 1,600 mg: 1600 mg | ORAL | @ 17:00:00

## 2020-01-26 MED ADMIN — magnesium sulfate 2gm/50mL IVPB: 2 g | INTRAVENOUS | @ 05:00:00 | Stop: 2020-01-26

## 2020-01-26 MED ADMIN — nitroglycerin (NITROSTAT) SL tablet 0.4 mg: .4 mg | SUBLINGUAL | @ 03:00:00

## 2020-01-26 MED ADMIN — ezetimibe (ZETIA) tablet 10 mg: 10 mg | ORAL | @ 06:00:00

## 2020-01-26 MED ADMIN — insulin lispro (HumaLOG) injection 0-12 Units: 0-12 [IU] | SUBCUTANEOUS | @ 17:00:00

## 2020-01-26 MED ADMIN — gabapentin (NEURONTIN) capsule 300 mg: 300 mg | ORAL | @ 17:00:00

## 2020-01-26 MED ADMIN — pantoprazole (PROTONIX) EC tablet 40 mg: 40 mg | ORAL | @ 17:00:00

## 2020-01-26 MED ADMIN — ticagrelor (BRILINTA) tablet 90 mg: 90 mg | ORAL | @ 17:00:00

## 2020-01-26 MED ADMIN — perflutren protein type-A microspheres (OPTISON) injection 1.5 mL: 1.5 mL | INTRAVENOUS | @ 13:00:00 | Stop: 2020-01-26

## 2020-01-26 MED ADMIN — aspirin chewable tablet 81 mg: 81 mg | ORAL | @ 17:00:00

## 2020-01-26 MED ADMIN — allopurinoL (ZYLOPRIM) tablet 200 mg: 200 mg | ORAL | @ 17:00:00

## 2020-01-26 MED ADMIN — gentamicin-sodium citrate lock solution in NS: 2.2 mL

## 2020-01-26 MED ADMIN — carvediloL (COREG) tablet 3.125 mg: 3.125 mg | ORAL | @ 06:00:00

## 2020-01-26 MED ADMIN — atorvastatin (LIPITOR) tablet 80 mg: 80 mg | ORAL | @ 17:00:00

## 2020-01-26 NOTE — Unmapped (Signed)
Pt admitted to CICU from OSH for high risk PCI. EKG performed on admission, CHG given. Heparin and amio gtt's continued. On admission pt stood on standing scale and became listless. Pt transferred to bed and symptoms were relieved. There were no vital sign changes at that time. Resident aware of higher systolics. Some home meds given overnight, resolving hypertension. Pt reported a fall recently, fall education given and arm band applied. Pt desatted intermittely overnight into the 60's. Newman applied up to 15L before BiPAP. Pt had 63 beat run of VT. See saved & printed strip. WCTM.    Problem: Adult Inpatient Plan of Care  Goal: Absence of Hospital-Acquired Illness or Injury  Intervention: Identify and Manage Fall Risk  Recent Flowsheet Documentation  Taken 01/26/2020 0000 by Elly Modena, RN  Safety Interventions:  ??? commode/urinal/bedpan at bedside  ??? fall reduction program maintained  ??? low bed  ??? nonskid shoes/slippers when out of bed  Intervention: Prevent Skin Injury  Recent Flowsheet Documentation  Taken 01/26/2020 0000 by Elly Modena, RN  Skin Protection:  ??? incontinence pads utilized  ??? tubing/devices free from skin contact  Intervention: Prevent and Manage VTE (Venous Thromboembolism) Risk  Recent Flowsheet Documentation  Taken 01/26/2020 0200 by Elly Modena, RN  Activity Management: activity adjusted per tolerance  Taken 01/26/2020 0000 by Elly Modena, RN  Activity Management: ambulated in room     Problem: Fall Injury Risk  Goal: Absence of Fall and Fall-Related Injury  Intervention: Promote Injury-Free Environment  Recent Flowsheet Documentation  Taken 01/26/2020 0000 by Elly Modena, RN  Safety Interventions:  ??? commode/urinal/bedpan at bedside  ??? fall reduction program maintained  ??? low bed  ??? nonskid shoes/slippers when out of bed     Problem: Functional Ability Impaired (Heart Failure)  Goal: Optimal Functional Ability  Intervention: Optimize Functional Ability  Recent Flowsheet Documentation  Taken 01/26/2020 0200 by Elly Modena, RN  Activity Management: activity adjusted per tolerance  Taken 01/26/2020 0000 by Elly Modena, RN  Activity Management: ambulated in room     Problem: Tissue Perfusion (Acute Coronary Syndrome)  Goal: Adequate Tissue Perfusion  Intervention: Optimize Cardiac Tissue Perfusion  Recent Flowsheet Documentation  Taken 01/26/2020 0200 by Elly Modena, RN  Activity Management: activity adjusted per tolerance  Taken 01/26/2020 0000 by Elly Modena, RN  Activity Management: ambulated in room

## 2020-01-26 NOTE — Unmapped (Signed)
3:30HR HD, goal 3-L UF, will monitor.

## 2020-01-26 NOTE — Unmapped (Signed)
DIVISION OF CARDIOLOGY  [facility] [place]        Date of Service: 01/25/2020    CICU History & Physical     Problem List:   Principal Problem:    NSTEMI (non-ST elevation myocardial infarction) (CMS-[name])  Active Problems:    Coronary artery disease of native artery of native heart with stable angina pectoris (CMS-[name])    Idiopathic chronic gout of left knee    Hypercholesteremia    Diabetes mellitus with coincident hypertension (CMS-[name])    Obesity, Class III, BMI 40-49.9 (morbid obesity) (CMS-[name])    GERD (gastroesophageal reflux disease)    Paroxysmal atrial fibrillation (CMS-[name])    OSA (obstructive sleep apnea)    Recurrent major depressive disorder, in full remission (CMS-[name])    HFrEF (heart failure with reduced ejection fraction) (CMS-[name])    Chronic anticoagulation    ESRD needing dialysis (CMS-[name])  Resolved Problems:    * No resolved hospital problems. *      Assessment/Plan: Jeremy Oliver is a 49 y.o. male with a past medical history significant for ESRD on dialysis (T/TH/S), CAD??s/p multiple PCIs, hx of VT after STEMI in 2018 s/p ICD,??T2DM, HTN, HLD, HFrEF (echo 08/2019 EF 35% w G3DD),??TIA, paroxysmal a-fib??on??Apixaban, OSA on CPAP, non-epileptic seizures presenting with chest pain and troponin elevation consistent with NSTEMI in the setting of known multivessel CAD.    Neurological   Depression  Continue home Prozac.    Pulmonary   OSA  Home CPAP nightly.    Cardiovascular   NSTEMI - Multivessel CAD s/p multiple PCI  History of extensive coronary artery disease with multiple stents in the past.  Notably had STEMI in 2018 with VT arrest after which his ICD was placed and had DES to mLAD.  Subsequently had NSTEMI in 02/2018 with DES x2 to mid RCA and RPLA/RPDA bifurcation.  NSTEMI in 05/2018 with PCI to proximal/mid LAD.  NSTEMI in 09/2018 with PCI to distal RCA.  Most recently had admission for chest pain and concern for worsening coronary disease in 07/2019 at which time he had DES x2 to the LAD and LCx as well as balloon angioplasty of diagonal 2.  Now presenting with acute onset chest pain and significant troponin elevated at outside hospital.  While at [facility] regional did have left heart catheterization (working on having films uploaded to patient's record at [facility]) which noted again severe multivessel disease, but did not undergo intervention due to concerns for high risk.  Now transferred to [facility] for consideration of high risk PCI.  Will continue on daily aspirin, but holding P2Y12 for now given some question of potential CABG.  Anticipate based on prior discussions will remain a poor candidate for CABG and can restart home Brilinta.  Otherwise will continue on heparin drip for now and discuss type and timing any potential interventions with interventional team.  Repeating TTE here as well.  - ASA 81mg  daily  - Hold Brilinta for now pending discussion of potential intervention  - Continue heparin gtt  - Repeat TTE  - NTG PRN recurrent chest pain    Paroxysmal a-fib  Longstanding history of paroxysmal A. fib.  Was in A. fib with RVR on presentation to outside hospital and subsequently started on amiodarone infusion.  Does take amiodarone and apixaban chronically at home.  At the time of arrival to [facility] he remains on amiodarone infusion and is in normal sinus rhythm.  We will continue infusion for now, but anticipate can likely transition  back to oral amiodarone quickly particularly if he remains in sinus rhythm.  - Continue Amiodarone infusion; transition to home PO Amiodarone as able  - Holding home Apixaban while on heparin infusion    HFrEF  Most recent echo in 08/2019 with LVEF 35% and grade 3 diastolic dysfunction.  During left heart cath at [facility] reportedly had LVEF of approximately 20%.  Appears well compensated at this time.  Continuing home medications, but holding on diuretics for now and will dose as needed.    Hypertension  Significantly hypertensive on arrival. Continue home antihypertensive regimen including Amlodipine 10mg  daily, Coreg 3.125mg  BID, and Imdur 30mg  daily.    Hypercholesterolemia  Continue Atorvastatin and Ezetimibe.    Renal   ESRD on iHD ([name])  No acute needs for dialysis at time of admission. Consult Nephrology for dialysis while admitted.    Infectious Disease/Autoimmune   NAI    FEN/GI   GERD  Continue home PPI.    Malnutrition Assessment: Not done yet.       Heme/Coag   Normocytic anemia  At baseline currently; consistent with anemia of chronic renal disease.    Endocrine   T2DM  Continue home insulin. Also on weekly Semaglutide injection.    Prophylaxis/LDA/Restraints/Consults   Can CVC be removed? N/A, no CVC present (including vascular catheter for HD or PLEX)   Can A-line be removed? N/A, no A-line present  Can Foley be removed? N/A, no Foley present  Mobility plan: Step 6 - Ambulate in hallway    Feeding: NPO for procedure  Analgesia: Pain adequately controlled  Sedation SAT/SBT: N/A  Thrombembolic ppx: Fully anticoagulated  Head of bed >30 degrees: Yes  Ulcer ppx: Yes, home use continued  Glucose within target range: Yes, in range    RASS at goal? N/A, not on sedation  [place] Agitation Assessment Scale (RASS) : 0 (01/26/2020 12:00 AM)     Can antipsychotics be stopped? N/A, not on antipsychotics  CAM-ICU Result: Negative (01/26/2020 12:00 AM)      Patient Lines/Drains/Airways Status    Active Active Lines, Drains, & Airways     Name:   Placement date:   Placement time:   Site:   Days:    Peripheral IV 01/23/20 Left Antecubital   01/23/20    0000    Antecubital   3    Peripheral IV 01/23/20 Anterior;Distal;Right;Upper Arm   01/23/20    0000    Arm   3    Peripheral IV 01/23/20 Anterior;Left Forearm   01/23/20    0000    Forearm   3    Hemodialysis Catheter 07/27/19 Right Internal jugular 2.2 mL 2.3 mL   07/27/19    1018    Internal jugular   182              Patient Lines/Drains/Airways Status    Active Wounds     None Goals of Care     Code Status: Full Code    Designated Healthcare Decision Maker:  Mr. Boze currently has decisional capacity for healthcare decision-making and is able to designate a surrogate healthcare decision maker. [name] designated healthcare decision maker is [name] Snavely (the patient's spouse) as denoted by stated patient preference.      Co-morbidities     The patient's disposition has been complicated by the following clinically significant conditions requiring additional evaluation and treatment or having a significant effect of this patient's care: - Age related debility POA requiring additional resources: DME,  PT, or OT  - General physical deterioration POA requiring additional resources: DME, PT, or OT  - Fatigue affecting clinical decision making including medication, disposition, or length of stay  - Weakness affecting clinical decision making including medication, disposition, or length of stay    The patient's hospital stay has been complicated by the following clinically significant conditions requiring additional evaluation and treatment or having a significant effect of this patient's care: - Chronic kidney disease POA requiring further investigation, treatment, or monitoring    Body mass index is 46.7 kg/m??.                             Subjective     HPI:  Jeremy Oliver is a 49 y.o. male with past medical history significant for ESRD on dialysis (T/TH/S), CAD??s/p multiple PCIs, hx of VT after STEMI in 2018 s/p ICD,??T2DM, HTN, HLD, HFrEF (echo 08/2019 EF 35% w G3DD),??TIA, paroxysmal a-fib??on??Apixaban, OSA on CPAP, non-epileptic seizures who presents as a transfer from [facility] where he initially presented with chest pain and dyspnea.    Patient reports he was taking a nap on Monday afternoon after his session of dialysis when he was suddenly awoken from sleep with acute onset chest pain described as substernal, 10 out of 10, radiating to the neck, and described as feeling like stabbing and an elephant on my chest.  He also had associated dyspnea and diaphoresis at the time.  He took his own sublingual nitroglycerin x2 without any improvement and called EMS.  While on route to the ED he was given spray nitroglycerin which he states brought his pain initially down to a 2 of 10, and after second dose of nitroglycerin his pain fully resolved by the time he got to the emergency department.  He notes that prior to onset of chest pain he had been in his normal state of health and denies any recent illness.  Specifically denies any fever, chills, abdominal pain, nausea, vomiting, diarrhea, edema, or any other concerns.  States that his symptoms that brought emerged from and felt similar to prior heart attacks.    At the outside hospital, noted to have significant troponin elevation to 554 with uptrend of 5000.55.  He was taken to the Cath Lab on 10/27 and noted to have diffuse 80% left main stenosis, 90% ostial LAD, severely dilated cardiomyopathy with EF around 20%.  Patient's case was discussed between interventionalists at [facility] and here and decided to transfer to [facility] for consideration of complex intervention.      ROS: Ten-point review of systems is obtained and is negative except as noted in HPI.    Allergies  Allergies   Allergen Reactions   ??? Losartan      Hyperkalemia (K>6)   ??? Opioids - Morphine Analogues Anxiety       Meds  No current facility-administered medications on file prior to encounter.     Current Outpatient Medications on File Prior to Encounter   Medication Sig   ??? acetaminophen (TYLENOL) 500 MG tablet TAKE 2 TABLETS (1000MG ) BY MOUTH EVERY 8 HOURS AS NEEDED FOR PAIN   ??? albuterol HFA 90 mcg/actuation inhaler Inhale 2 puffs into the lungs every 6 (six) hours as needed for wheezing or shortness of breath.   ??? alirocumab (PRALUENT) 150 mg/mL subcutaneous injection Inject the contents of 1 pen (150 mg total) under the skin every fourteen (14) days.   ??? allopurinoL (ZYLOPRIM) 100  MG tablet Take 2 tablets (200 mg total) by mouth daily.   ??? amiodarone (PACERONE) 200 MG tablet TAKE 1 TABLET BY MOUTH ONCE DAILY   ??? amLODIPine (NORVASC) 10 MG tablet Take 10 mg by mouth daily. Pre patient's wife taking daily. No Longer holding on Dialysis days   ??? apixaban (ELIQUIS) 5 mg Tab Take 1 tablet (5 mg total) by mouth Two (2) times a day.   ??? atorvastatin (LIPITOR) 80 MG tablet Take 1 tablet (80 mg total) by mouth daily.   ??? blood sugar diagnostic Strp Use as directed to check blood glucose Four (4) times a day (before meals and nightly).   ??? blood-glucose meter kit Disp. blood glucose meter kit preferred by patient's insurance. Dx: Diabetes, E11.9   ??? bumetanide (BUMEX) 2 MG tablet Take 1 tablet (2 mg total) by mouth two (2) times a day. On non dialysis days   ??? cholecalciferol, vitamin D3 25 mcg, 1,000 units,, 1,000 unit (25 mcg) tablet Take 1 tablet (25 mcg total) by mouth daily.   ??? diclofenac sodium (VOLTAREN) 1 % gel Apply 2 grams topically Three (3) times a day.   ??? empty container (SHARPS-A-GATOR DISPOSAL SYSTEM) Misc Use as directed for sharps disposal   ??? ezetimibe (ZETIA) 10 mg tablet TAKE 1 TABLET BY MOUTH ONCE DAILY   ??? FLUoxetine (PROZAC) 40 MG capsule Take 2 capsules (80 mg total) by mouth daily.   ??? gabapentin (NEURONTIN) 300 MG capsule Take 1 capsule (300 mg total) by mouth two (2) times a day.   ??? hydrOXYzine (ATARAX) 10 MG tablet Take 1 tablet (10 mg total) by mouth nightly as needed for anxiety.   ??? insulin ASPART (NOVOLOG FLEXPEN U-100 INSULIN) 100 unit/mL (3 mL) injection pen Inject 15 Units under the skin Three (3) times a day before meals.   ??? insulin detemir U-100 (LEVEMIR FLEXTOUCH U-100 INSULN) 100 unit/mL (3 mL) injection pen Inject 37 units total under the skin nightly   ??? insulin syringe-needle U-100 0.3 mL 31 gauge x 5/16 Syrg Use to inject insulin twice daily with meals   ??? isosorbide mononitrate (IMDUR) 30 MG 24 hr tablet Take 1 tablet (30 mg total) by mouth daily. DO NOT TAKE on dialysis days   ??? lancets Misc Use as directed to check blood glucose Four (4) times a day (before meals and nightly).   ??? loperamide (IMODIUM) 2 mg capsule Take 1 capsule twice a day and another capsule after each episode of diarrhea, up to 8 pills per day.   ??? metoprolol succinate (TOPROL XL) 100 MG 24 hr tablet Take 1 tablet (100 mg total) by mouth daily. (Patient taking differently: Take 100 mg by mouth Take as directed. Do not take on Dialysis days)   ??? miscellaneous medical supply (BLOOD PRESSURE CUFF) Misc Measure BP once a day or if any symptoms   ??? nitroglycerin (NITROSTAT) 0.4 MG SL tablet Place 1 tablet (0.4 mg total) under the tongue every five (5) minutes as needed for chest pain.   ??? omeprazole (PRILOSEC) 40 MG capsule Take 1 capsule (40 mg total) by mouth Two (2) times a day (30 minutes before a meal).   ??? ondansetron (ZOFRAN) 4 MG tablet Take 1 tablet (4 mg total) by mouth every twelve (12) hours as needed.   ??? semaglutide (OZEMPIC) 0.25 mg or 0.5 mg(2 mg/1.5 mL) PnIj injection Inject 0.5 mg under the skin every seven (7) days.   ??? sevelamer (RENVELA) 800 mg tablet TAKE 2  TABLETS (1600MG ) BY MOUTH THREE TIMES DAILY WITH MEALS   ??? ticagrelor (BRILINTA) 90 mg Tab TAKE 1 TABLET BY MOUTH TWICE DAILY   ??? UNIFINE PENTIPS 31 gauge x 5/16 (8 mm) Ndle USE WITH INSULIN AND VICTOZA 5 TIMES DAILY   ??? [DISCONTINUED] liraglutide (VICTOZA 2-PAK) 0.6 mg/0.1 mL (18 mg/3 mL) injection Inject 0.1 mL (0.6 mg total) under the skin daily.   ??? [DISCONTINUED] ranolazine (RANEXA) 500 MG 12 hr tablet Take by mouth two (2) times a day.   ??? [DISCONTINUED] traZODone (DESYREL) 50 MG tablet Take 1 tablet (50 mg total) by mouth nightly.       Past Medical History  Past Medical History:   Diagnosis Date   ??? Angina at rest (CMS-[name])    ??? Arthritis    ??? Asthma    ??? Atrial fibrillation and flutter (CMS-[name]) 06/08/14   ??? BMI 40.0-44.9, adult (CMS-[name]) 10/29/2015   ??? Chronic anticoagulation 02/17/2018   ??? Chronic pain syndrome 04/11/2014    ~Use daily lyrica as recommended ~Tramadol as needed for pain ~Fluoxetine daily as recommended ~Daily exercise ~Eat a healthy diet ~Good control of your blood sugars can help    ??? Coronary artery disease    ??? Depression    ??? Diabetes mellitus (CMS-[name])    ??? ESRD (end stage renal disease) on dialysis (CMS-[name])    ??? Eye trauma 1998    glass in both eyes and removed   ??? GERD (gastroesophageal reflux disease)    ??? Gout    ??? Homeless    ??? Hyperlipidemia    ??? Hypertension    ??? Illiteracy and low-level literacy    ??? Microscopic hematuria    ??? Migraine    ??? Nephrolithiasis    ??? 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 Nonalcoholic fatty liver disease    ??? NSTEMI (non-ST elevated myocardial infarction) (CMS-[name]) 01/13/2017   ??? Obesity    ??? Obstructive sleep apnea 2009   ??? Panic attacks    ??? 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 Seizure-like activity (CMS-[name])     ~Monitor for symptoms and report them to your primary care provider if they occur    ??? Systolic CHF, chronic (CMS-[name])     EF 40-45% 2017   ??? TIA (transient ischemic attack)        Past Surgical History  Past Surgical History:   Procedure Laterality Date   ??? CARDIAC CATHETERIZATION  03/08/2007   ??? CORONARY STENT PLACEMENT     ??? CYSTOURETHROSCOPY  12/31/2009     Microscopic hematuria   ??? KNEE SURGERY Right 09/23/2006    arthroscopic knee surgery medial and lateral meniscus tears as well as crystal disease   ??? PR CATH PLACE/CORON ANGIO, IMG SUPER/INTERP,R&L HRT CATH, L HRT VENTRIC N/A 02/13/2017 Procedure: Left/Right Heart Catheterization;  Surgeon: Marlaine Hind, MD;  Location: [facility] CATH;  Service: Cardiology   ??? PR CATH PLACE/CORON ANGIO, IMG SUPER/INTERP,W LEFT HEART VENTRICULOGRAPHY N/A 03/17/2017    Procedure: Left Heart Catheterization W Intervention;  Surgeon: Marlaine Hind, MD;  Location:  [facility] CATH;  Service: Cardiology   ??? PR CATH PLACE/CORON ANGIO, IMG SUPER/INTERP,W LEFT HEART VENTRICULOGRAPHY N/A 05/12/2018    Procedure: CATH LEFT HEART CATHETERIZATION W INTERVENTION;  Surgeon: Dorathy Kinsman, MD;  Location: [facility] CATH;  Service: Cardiology   ??? PR CATH PLACE/CORON ANGIO, IMG SUPER/INTERP,W LEFT HEART VENTRICULOGRAPHY N/A 10/22/2018    Procedure: CATH LEFT HEART CATHETERIZATION W INTERVENTION;  Surgeon: Marlaine Hind, MD;  Location: [facility] CATH;  Service: Cardiology   ??? PR CATH PLACE/CORON ANGIO, IMG SUPER/INTERP,W LEFT HEART VENTRICULOGRAPHY N/A 08/19/2019    Procedure: Left Heart Catheterization W Intervention;  Surgeon: Marlaine Hind, MD;  Location: [facility] CATH;  Service: Cardiology   ??? PR ECMO/ECLS INITIATION VENO-ARTERIAL N/A 03/19/2017    Procedure: ECMO/ECLS; INITIATION, VENO-ARTERIAL;  Surgeon: Lennie Odor, MD;  Location: MAIN OR [facility];  Service: Cardiac Surgery   ??? PR ECMO/ECLS RMVL PRPH CANNULA OPEN 6 YRS & OLDER Left 03/25/2017    Procedure: ECMO / ECLS PROVIDED BY PHYSICIAN; REMOVAL OF PERIPHERAL (ARTERIAL AND/OR VENOUS) CANNULA(E), OPEN, 6 YEARS AND OLDER;  Surgeon: Alonna Buckler Ikonomidis, MD;  Location: MAIN OR [facility];  Service: Cardiac Surgery   ??? PR EPHYS EVAL W/ ABLATION SUPRAVENT ARRHYTHMIA N/A 07/19/2015    Catheter ablation of cavotricuspid isthmus for atrial flutter [facility])   ??? PR INSER HART PACER XVENOUS ATRIAL N/A 05/30/2015    [employer] dual-chamber pacemaker implant (E.Chung)   ??? PR INSERT/PLACE FLOW DIRECT CATH N/A 03/17/2017    Procedure: Insert Leave In [place];  Surgeon: Marlaine Hind, MD;  Location: [facility] CATH;  Service: Cardiology   ??? PR INSJ/RPLCMT PERM DFB W/TRNSVNS LDS 1/DUAL CHMBR N/A 05/04/2017    Procedure: ICD Implant System (Single/Dual);  Surgeon: Eldred Manges, MD;  Location: [facility] CATH;  Service: Cardiology   ??? PR NEGATIVE PRESSURE WOUND THERAPY DME >50 SQ CM Left 03/25/2017    Procedure: Neg Press Wound Tx (Vac Assist) Incl Topicals, Per Session, Tsa Greater Than/= 50 Cm Squared;  Surgeon: Alonna Buckler Ikonomidis, MD;  Location: MAIN OR [facility];  Service: Cardiac Surgery   ??? PR PRQ TRLUML CORONARY STENT W/ANGIO ONE ART/BRNCH N/A 03/12/2018    Procedure: Percutaneous Coronary Intervention;  Surgeon: Marlaine Hind, MD;  Location: [facility] CATH;  Service: Cardiology   ??? PR REBL VES DIRECT,LOW EXTREM Left 03/19/2017    Procedure: Repr Bld Vessel Direct; Lower Extrem;  Surgeon: Boykin Reaper, MD;  Location: MAIN OR [facility];  Service: Vascular   ??? PR REMV ART CLOT ILIAC-POP,LEG INCIS Left 03/25/2017    Procedure: EMBOLECTOMY OR THROMBECTOMY, WITH OR WITHOUT CATHETER; FEMOROPOPLITEAL, AORTOILIAC ARTERY, BY LEG INCISION;  Surgeon: Alonna Buckler Ikonomidis, MD;  Location: MAIN OR [facility];  Service: Cardiac Surgery   ??? PR UPPER GI ENDOSCOPY,BIOPSY N/A 02/28/2016    Procedure: UGI ENDOSCOPY; WITH BIOPSY, SINGLE OR MULTIPLE;  Surgeon: Liane Comber, MD;  [facility];  Service: Gastroenterology   ??? PR UPPER GI ENDOSCOPY,DIAGNOSIS N/A 05/07/2016    Procedure: UGI ENDO, INCLUDE ESOPHAGUS, STOMACH, & DUODENUM &/OR JEJUNUM; DX W/WO COLLECTION SPECIMN, BY BRUSH OR WASH;  Surgeon: Modena Nunnery, MD;  [facility];  Service: Gastroenterology       Family History  Family History   Problem Relation Age of Onset   ??? Diabetes Mother    ??? Hyperlipidemia Mother    ??? Heart disease Mother    ??? Basal cell carcinoma Mother    ??? Blindness Mother  diabeties   ??? Obesity Mother    ??? Hyperlipidemia Father    ??? Heart disease Father    ??? Lung disease Father    ??? Heart disease Half-Sister ??? Kidney disease Half-Sister    ??? Gallbladder disease Half-Brother    ??? Obesity Half-Brother    ??? No Known Problems Daughter    ??? No Known Problems Son    ??? Obesity Daughter    ??? Depression Maternal Uncle         Died by suicide in October 09, 2017 by gunshot   ??? Melanoma Neg Hx    ??? Squamous cell carcinoma Neg Hx    ??? Macular degeneration Neg Hx    ??? Glaucoma Neg Hx        Social History  Social History     Socioeconomic History   ??? Marital status: Married     Spouse name: [name]   ??? Number of children: 3   ??? Years of education: 27   ??? Highest education level: Not on file   Occupational History   ??? Occupation: DISABLED     Comment: Former IT sales professional   Tobacco Use   ??? Smoking status: Never Smoker   ??? Smokeless tobacco: Current User     Types: Chew   ??? Tobacco comment: 08/15/19:  Dips 1x a day./ /Pt in process of reducing tobacco use,  dips 1 can every 3 weeks,   Vaping Use   ??? Vaping Use: Never used   Substance and Sexual Activity   ??? Alcohol use: Yes     Alcohol/week: 0.0 standard drinks     Comment: rare use: couple times a year, small amount   ??? Drug use: Never   ??? Sexual activity: Not Currently     Partners: Female   Other Topics Concern   ??? Do you use sunscreen? No   ??? Tanning bed use? No   ??? Are you easily burned? No   ??? Excessive sun exposure? No   ??? Blistering sunburns? No   Social History Narrative    Information updated:  01/08/16. Reviewed last: 12/01/18, 08/15/19        Born in [place]    Raised with both parents    Parents died w/in 6 mos of each other. His parents knew they were going to pass and told him just before.    Half sister and 2 half brothers        Married 31 yrs. Wife [name]    2 other children son and daughter, adopted out    Daughter Bridgette, bf DJ        Education - completed through 11th. 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    Former Presenter, broadcasting since age 34    Lives with wife, difficulty paying bills 12/30/17     Lives next door to daughter, husband and their grandson        05/19/18: pt moving to new home in next 5 days- after an eviction. Has Medicaid assistance again. Will be w/ wife [name], they babysit their grandson often.    06/14/18: Pt and husband living with daughter while new location to live is determined.    12/01/18: pt living with wife and friends. Friends have two young children. [name] states soon they will  have their own place, however doesn't know when. Pt states it will be after the neighbors are evicted, we can move in. Pt will need to start dialysis soon. Pt and wife Not talking with daughter and grandson lately.      Social Determinants of Health     Financial Resource Strain: Medium Risk   ??? Difficulty of Paying Living Expenses: Somewhat hard   Food Insecurity: Food Insecurity Present   ??? Worried About Programme researcher, broadcasting/film/video in the Last Year: Sometimes true   ??? Ran Out of Food in the Last Year: Sometimes true   Transportation Needs: No Transportation Needs   ??? Lack of Transportation (Medical): No   ??? Lack of Transportation (Non-Medical): No   Physical Activity:    ??? Days of Exercise per Week:    ??? Minutes of Exercise per Session:    Stress: Stress Concern Present   ??? Feeling of Stress : Rather much   Social Connections: Moderately Isolated   ??? Frequency of Communication with Friends and Family: More than three times a week   ??? Frequency of Social Gatherings with Friends and Family: Twice a week   ??? Attends Religious Services: Never   ??? Active Member of Clubs or Organizations: No   ??? Attends Banker Meetings: Never   ??? Marital Status: Married           Objective     Vitals - past 24 hours  Temp:  [36.8 ??C-36.9 ??C] 36.8 ??C  Heart Rate:  [70-79] 70  Resp:  [9-24] 20  BP: (156-189)/(68-83) 186/70  SpO2:  [90 %-100 %] 90 % Intake/Output  No intake/output data recorded.     Wt Readings from Last 3 Encounters:   01/25/20 (!) 165 kg (363 lb 12.1 oz)   12/15/19 (!) 161 kg (355 lb)   11/21/19 (!) 161.6 kg (356 lb 4.2 oz)       Physical Exam:    Gen: No acute distress, resting comfortably in bed  Eyes: Sclerae anicteric, pupils equal round and reactive, extraocular movements intact  HENT: Mucous membranes moist, oropharynx clear  Neck: Supple, no masses or adenopathy appreciated  CV: Normal rate, regular rhythm, no appreciable murmur/rub/gallop, distal pulses intact  Pulm: Normal effort, clear to auscultation throughout, no crackles/wheezes/rhonchi  Abd: Morbidly obese, soft, nontender, nondistended, normoactive bowel sounds  Ext: Warm, well perfused, trace ankle edema  Skin: No obvious rashes or lesions  Neuro: No focal deficits  Psych: Appropriate mood and affect      Continuous Infusions:   ??? amioDARONE 0.5 mg/min (01/26/20 0200)   ??? heparin 12 Units/kg/hr (01/26/20 0200)       Scheduled Medications:   ??? allopurinoL  200 mg Oral Daily   ??? amLODIPine  10 mg Oral Daily   ??? atorvastatin  80 mg Oral Daily   ??? carvediloL  3.125 mg Oral BID   ??? ezetimibe  10 mg Oral Nightly   ??? FLUoxetine  80 mg Oral Daily   ??? gabapentin  300 mg Oral BID   ??? insulin glargine  37 Units Subcutaneous Nightly   ??? insulin lispro  0-12 Units Subcutaneous ACHS   ??? isosorbide mononitrate  30 mg Oral Daily   ??? magnesium sulfate  2 g Intravenous Once   ??? pantoprazole  40 mg Oral BID   ??? sevelamer  1,600 mg Oral 3xd Meals       PRN medications:  acetaminophen, dextrose 50 % in water (D50W), heparin (  porcine), melatonin, nitroglycerin    Data/Imaging Review: Reviewed in Epic and personally interpreted on 01/26/2020. See EMR for detailed results.

## 2020-01-26 NOTE — Unmapped (Signed)
Care Management  Initial Transition Planning Assessment    Per H:&P note:  Jeremy Oliver is a 49 y.o. male with a past medical history significant for ESRD on dialysis (T/TH/S), CAD s/p multiple PCIs, hx of VT after STEMI in 2018 s/p ICD, T2DM, HTN, HLD, HFrEF (echo 08/2019 EF 35% w G3DD), TIA, paroxysmal a-fib on Apixaban, OSA on CPAP, non-epileptic seizures presenting with chest pain and troponin elevation consistent with NSTEMI in the setting of known multivessel CAD.    Pt retired and independent with ADLs prior to admission. Denies illicit drug/cigarette/ETOH usage. Prefers to use [facility] at discharge. Spouse to provide transportation at discharge. Pt states b/c of ???drop foot??? he???s unable to attend outpatient cardiac rehab. Pt made aware about [facility] and stated he would be able to attend online. Pt made aware referral would be entered. Pt verbalized understanding and agreed with plan. Receives HD at: [facility] [address] [place], [place] [address] [phone]              General  Care Manager assessed the patient by : In person interview with patient, Discussion with Clinical Care team, Medical record review  Orientation Level: Oriented X4  Functional level prior to admission: Independent  Reason for referral: Discharge Planning    Contact/Decision Maker  Extended Emergency Contact Information  Primary Emergency Contact: Ellithorpe,[name]  Address: [address]           [place], [place] [address] [place] Phone: [phone]  Relation: Spouse  Secondary Emergency Contact: Kukuk,Bridgette   [place]  Home Phone: [phone]  Mobile Phone: [phone]  Relation: Daughter    Legal Next of Kin / Guardian / POA / Advance Directives     [name] (HCPOA): Brubeck,[name] - [phone]    [name], First Alternate: Hedding,Bridgette - Daughter - [phone]    Advance Directive (Medical Treatment)  Does patient have an advance directive covering medical treatment?: Patient has advance directive covering medical treatment, copy in chart.    Health Care Decision Maker [[name]] (Medical & Mental Health Treatment)  Healthcare Decision Maker: [name] documented in the [name]/Contact Info section.    Advance Directive (Mental Health Treatment)  Does patient have an advance directive covering mental health treatment?: Patient has advance directive covering mental health treatment, copy in chart.    Patient Information  Lives with: Spouse/significant other    Type of Residence: Private residence        Location/Detail: One level home with 3 steps to enter  Type of Residence: Mailing Address:  [address]  [place] [address]  Physical Address: [address] [place] [address]    Patient Phone Number: [phone]        Medical Provider(s): Kurtis Bushman, MD-last seen Sept 2021    Reason for Admission: Admitting Diagnosis:  NSTEMI  Past Medical History:   has a past medical history of Angina at rest (CMS-[name]), Arthritis, Asthma, Atrial fibrillation and flutter (CMS-[name]) (06/08/14), BMI 40.0-44.9, adult (CMS-[name]) (10/29/2015), Chronic anticoagulation (02/17/2018), Chronic pain syndrome (04/11/2014), Coronary artery disease, Depression, Diabetes mellitus (CMS-[name]), ESRD (end stage renal disease) on dialysis (CMS-[name]), Eye trauma (1998), GERD (gastroesophageal reflux disease), Gout, Homeless, Hyperlipidemia, Hypertension, Illiteracy and low-level literacy, Microscopic hematuria, Migraine, Nephrolithiasis, 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  Past Surgical History:   has a past surgical history that includes Knee surgery (Right, 09/23/2006); Cardiac catheterization (03/08/2007); Cystourethroscopy (12/31/2009 ); pr inser hart pacer xvenous atrial (N/A, 05/30/2015); pr ephys eval w/ ablation supravent arrhythmia (N/A, 07/19/2015); pr upper gi endoscopy,biopsy (N/A, 02/28/2016); pr upper gi endoscopy,diagnosis (N/A, 05/07/2016); pr cath place/coron angio, img super/interp,r&l hrt cath, l hrt ventric (N/A, 02/13/2017); pr cath place/coron angio, img super/interp,w left heart ventriculography (N/A, 03/17/2017); pr insert/place flow direct cath (N/A, 03/17/2017); pr ecmo/ecls initiation veno-arterial (N/A, 03/19/2017); pr rebl ves direct,low extrem (Left, 03/19/2017); pr ecmo/ecls rmvl prph cannula open 6 yrs & older (Left, 03/25/2017); pr remv art clot iliac-pop,leg incis (Left, 03/25/2017); pr negative pressure wound therapy dme >50 sq cm (Left, 03/25/2017); pr insj/rplcmt perm dfb w/trnsvns lds 1/dual chmbr (N/A, 05/04/2017); pr prq trluml coronary stent w/angio one art/brnch (N/A, 03/12/2018); pr cath place/coron angio, img super/interp,w left heart ventriculography (N/A, 05/12/2018); pr cath place/coron angio, img super/interp,w left heart ventriculography (N/A, 10/22/2018); Coronary stent placement; and pr cath place/coron angio, img super/interp,w left heart ventriculography (N/A, 08/19/2019).   Previous admit date: 08/18/2019    Primary Insurance- Payor: MEDICARE / Plan: MEDICARE PART A AND PART B / Product Type: *No Product type* /   Secondary Insurance ??? Secondary Insurance  MEDICAID [place]  Prescription Coverage ??? secondary  Preferred Pharmacy - [facility] - [place], [place] - [address]  [facility]  [facility]  [facility]  [facility] - [place] ([place]), [place] - [address]    Transportation home: Private vehicle    Support Systems/Concerns: Spouse, Friends/Neighbors    Responsibilities/Dependents at home?: No    Home Care services in place prior to admission?: No        Equipment Currently Used at Home: wheelchair, manual, walker, standard, cane, straight (Bipap)  Current HME Agency (Name/Phone #): [facility]    Currently receiving outpatient dialysis?: Yes  Facility providing dialysis (Name/Contact Info): [facility] [place] [address] [place], [place] [address] [phone] with Dialysis center: [name] 0630    Financial Information       Need for financial assistance?: No       Social Determinants of Health  Housing/Utilities: Low Risk    ??? Within the past 12 months, have you ever stayed: outside, in a car, in a tent, in an overnight shelter, or temporarily in someone else's home (i.e. couch-surfing)?: No   ??? Are you worried about losing your housing?: No   ??? Within the past 12 months, have you been unable to get utilities (heat, electricity) when it was really needed?: No     Financial Resource Strain: Low Risk    ??? Difficulty of Paying Living Expenses: Not very hard   Food Insecurity: Food Insecurity Present   ??? Worried About Programme researcher, broadcasting/film/video in the Last Year: Sometimes true   ??? Ran Out of Food in the Last Year: Sometimes true   Transportation Needs: No Transportation Needs   ??? Lack of Transportation (Medical): No   ??? Lack of Transportation (Non-Medical): No         Discharge Needs Assessment  Concerns to be Addressed: denies needs/concerns at this time, discharge planning    Clinical Risk Factors: Multiple Diagnoses (Chronic), Dialysis    Barriers to taking medications: No    Prior overnight hospital stay or ED  visit in last 90 days: Yes    Readmission Within the Last 30 Days: no previous admission in last 30 days         Anticipated Changes Related to Illness: none    Equipment Needed After Discharge: none    Discharge Facility/Level of Care Needs: other (see comments) (HOme with self care)    Readmission  Risk of Unplanned Readmission Score: UNPLANNED READMISSION SCORE: 30%  Predictive Model Details          30% (High)  Factor Value    Calculated 01/26/2020 08:03 26% Number of active Rx orders 51    [place] Risk of Unplanned Readmission Model 16% Number of ED visits in last six months 4     7% Number of hospitalizations in last year 2     7% ECG/EKG order present in last 6 months     7% Charlson Comorbidity Index 8     7% Latest calcium low (7.9 mg/dL)     6% Diagnosis of electrolyte disorder present     5% Imaging order present in last 6 months     4% Latest hemoglobin low (11.0 g/dL)     3% Active anticoagulant Rx order present     3% Latest creatinine high (3.87 mg/dL)     3% Age 8     3% Diagnosis of renal failure present     1% Future appointment scheduled     1% Active ulcer medication Rx order present     1% Current length of stay 0.655 days      Readmitted Within the Last 30 Days? (No if blank)   Patient at risk for readmission?: Yes    Discharge Plan  Screen findings are: Discharge planning needs identified or anticipated (Comment). (TBD based on progression of care)    Expected Discharge Date: TBD    Expected Transfer from Critical Care: 01/28/20    Quality data for continuing care services shared with patient and/or representative?: Yes  Patient and/or family were provided with choice of facilities / services that are available and appropriate to meet post hospital care needs?: Yes   List choices in order highest to lowest preferred, if applicable. : Pt stated preferred [facility] program.    Initial Assessment complete?: Yes    CM met with patient in pt room.  Pt/visitors were not wearing hospital provided masks for the duration of the interaction with CM.   CM was wearing hospital provided surgical mask and hospital provided eye protection.  CM was not within 6 foot of the patient/visitors during this interaction.     Audie Clear   Care Manager  Phone [phone]  Pager [phone]

## 2020-01-26 NOTE — Unmapped (Cosign Needed)
CICU Treatment Plan   Hospital Day: 2    Subjective / Interval History:    Jeremy Oliver is a 49 year old male with a past medical history significant for ESRD on dialysis, CAD s/p multiple PCI's, history of PT after STEMI in 2018 s/p ICD with pacer, type II DM, hypertension, hyperlipidemia, HFrEF (echo 08/2019 EF 35% with G3 DD), TIA, paroxysmal A. fib on apixaban, OSA on CPAP, nonepileptic seizures who presented to [facility] as an outside hospital transfer with recurrence of NSTEMI.  Patient initially presented to [facility] on 10/25 with symptoms of typical chest pain similar to his prior STEMI's.  He was noted to have significant troponin elevation to 554 with an uptrend of 5055.  He was taken to the Cath Lab on 10/27 and noted to have diffuse 80% left main stenosis, 90% ostial LAD, severely dilated cardiomyopathy with EF around 20%.  He was transferred to [facility] for consideration of complex intervention.    Overnight noted to go into slow, wide complex rhythm which lasted briefly.  Went back to previous morphology rather quickly without intervention.    Assessment/Plan:        Principal Problem:    NSTEMI (non-ST elevation myocardial infarction) (CMS-[name])  Active Problems:    Coronary artery disease of native artery of native heart with stable angina pectoris (CMS-[name])    Idiopathic chronic gout of left knee    Hypercholesteremia    Diabetes mellitus with coincident hypertension (CMS-[name])    Obesity, Class III, BMI 40-49.9 (morbid obesity) (CMS-[name])    GERD (gastroesophageal reflux disease)    Paroxysmal atrial fibrillation (CMS-[name])    OSA (obstructive sleep apnea)    Recurrent major depressive disorder, in full remission (CMS-[name])    HFrEF (heart failure with reduced ejection fraction) (CMS-[name])    Chronic anticoagulation    ESRD needing dialysis (CMS-[name])  Resolved Problems:    * No resolved hospital problems. *      Jeremy Oliver is a 49 y.o. male with significant hx of ESRD (dialysis T/Th/Sa), CAD s/p multiple PCIs, hx of VT after STEMI in 2018 s/p ICD, T2DM, HTN, HLD, HFrEF (echo 08/2019 EF 35% w/ G3DD, TIA, pAF on apixaban, OSA on CPAP, and non-epileptic seizures s/p LHC at OSH for NSTEMI presenting at [facility] for consideration of complex intervention.     Neurological   Depression  Continue home Prozac.       Pulmonary   OSA  Home CPAP nightly.    Cardiovascular   NSTEMI - Multivessel CAD s/p multiple PCI-Wide Complex Rhythm   S/p STEMI in 2018 with VT arrest leading to ICD placement and DES to LAD.  NSTEMI in 02/2018 with DES x2 to mid RCA and RPLA/RPDA bifurcation.  NSTEMI in 05/2018 with PCI to proximal/mid LAD.  NSTEMI in 09/2018 with PCI to distal RCA.  On 07/2019 had DES x2 to the LAD and left circumflex as well as balloon angioplasty of diagonal 2.  S/p LHC at [facility] on 01/25/2020 noting severe multivessel disease without intervention given high risk.  Transferred to [facility] for consideration of high risk PCI. Wide complex rhythm noted overnight last 63 beats but with resolution w/o intervention. Question if this was likely due to his pacemaker.   --Continue aspirin  --Brillinta, not a candidate for CABG   -- Continue heparin gtt per below   --Repeat TTE  --Follow up repeat troponin   --NPO @[facility]  for PCI     Paroxysmal a-fib  Longstanding history on apixaban at home.  Was in A. fib with RVR on presentation to [facility] and started on amiodarone infusion.  Does take amiodarone at home.  Has been normal sinus rhythm during this admission.    --Continue holding home apixaban while on heparin infusion  --Discontinue amiodarone gtt and restart PO amiodarone     HFrEF  Most recent echo in 08/2019 with LVEF 35% and grade 3 diastolic dysfunction.  During left heart cath at [facility] reportedly had LVEF of approximately 20%.  Appears well compensated at this time.  Continuing home medications, but holding on diuretics for now and will dose as needed.  ??  Hypertension  Significantly hypertensive on arrival. Continue home antihypertensive regimen including Amlodipine 10mg  daily, Coreg 3.125mg  BID, and Imdur 30mg  daily. Per records review patient no longer on amlodipine.   --Will hold Beta blockers today in anticipation of dialysis.   --Discontinue amlodipine   ??  Hypercholesterolemia  Continue Atorvastatin and Ezetimibe.      Renal   ESRD on iHD   Per the patient he gets dialysis on [name] although review of documentation states he gets it T/TH/Sa. Per the pt, he recently switched his schedule to [name] due to better scheduling purposes. Last dialyzed on 10/27.   --Nephrology following  --Dialysis today prior to PCI tomorrow     FEN/GI   Continue home PPI     Heme/Coag   Normocytic anemia  At baseline currently; consistent with anemia of chronic renal disease.    Endocrine   T2DM  Continue home insulin. Also on weekly Semaglutide injection.    Prophylaxis   ? VTE: Heparin gtt   ? GI: Not indicated    Cardiovascular History & Procedures:  ??  Cath / PCI:  ?? 08/19/19 LHC  1. There is??significant 2-vessel coronary artery disease. ??There is a 90% proximal LAD stenosis which extends into the proximal-LAD stent and 80% proximal LCx stenosis. The mid-LAD stent has mild ISR. The mid and distal RCA stents are widely patent. The RCA has moderate diffuse distal disease.   2. Elevated??left ventricular filling pressures (LVEDP = 24??mm Hg).  3. Successful PCI to the??LAD and LCx??with the placement of a Synergy DES x2??with excellent angiographic result and TIMI 3 flow.  4. Rescue balloon angioplasty of the DIAG2 vessel that was occluded after dilation of the LAD stent  ??  ?? 10/22/2018 LHC  1. ????Significant 90% stenosis of distal RCA, severe proximal LCx stenosis, and diffuse severe distal LAD stenosis  2. ????Elevated LVEDP = 22   3. ????Successful DES to the distal RCA with the placement of a Synergy 4.0 x 20 with excellent angiographic result and TIMI 3 flow.  ??  ?? 05/12/2018 LHC  5. Coronary artery disease including diffuse moderate to severe lesions in the left coronary system, with particular emphasis on the 90% proximal to mid LAD in-stent restenosis.  6. Successful PCI to the proximal to mid LAD with the placement of a Synergy drug eluting stent with excellent angiographic result and TIMI 3 flow.  7. Subsequent impaired flow at an adjacent moderate diagonal, which was small and had pre-existing ostial disease. Recrossed and treated with balloon angioplasty with restored flow.  8. Recent stent to RPDA/RPLA bifurcation widely patent.  9. Normal left ventricular filling pressures (LVEDP = 8 mm Hg).  ??  ?? 03/12/2018 LHC  1. Coronary artery disease including??severe mid RCA, and distal RCA bifurcation stenosis.  2. Left heart cath deferred  3. Successful??PCI??to the mid RCA and RPLA/RPDA bifurcation??with the placement  of a Synergy DES x??with excellent angiographic result and TIMI 3 flow.  ??  ?? 03/17/2017 LHC  ? Significant 2-vessel??coronary artery disease.90% stenosis of the proximal LAD with TIMI 1 flow into the distal vessel. There is a 70% stenosis of the ostial DIAG2 vessel which was treated with balloon angiogplasty.??There is a 70% stenosis of the proximal LCx and there is mild-moderate RCA disease.   ? Decreased??LV Function by previous echo ??EF estimated 30%. LVEDP 35 mm Hg  ? PCI??was performed of the??proximal and mid 90%??LAD lesion using Resolute Onyx (drug eluting stent)??x 2. Good??angiographic result with TIMI??3??flow  ? Impella was placed for cardiogenic shock prior to PCI  ? Right heart cath after Impella placement- RA mean 11, RV 40/8, PA 41/21 mean 29, ??PCWP mean 12  ? PA sat 58, Fick CO/CI 4.6/1.7    Code Status: Full code     Dispo: CICU

## 2020-01-26 NOTE — Unmapped (Signed)
[facility] Nephrology Hemodialysis Procedure Note     01/26/2020    Jeremy Oliver was seen and examined on hemodialysis    CHIEF COMPLAINT: End Stage Renal Disease    INTERVAL HISTORY: Tolerating HD well. Developed chest pain towards end of treatment, and UF was turned off.     DIALYSIS TREATMENT DATA:  Estimated Dry Weight (kg): 160 kg (352 lb 11.8 oz)  Patient Goal Weight (kg): 3 kg (6 lb 9.8 oz)  Dialyzer: F-180 (98 mLs)  Dialysis Bath  Bath: 2 K+ / 2.5 Ca+  Dialysate Na (mEq/L): 137 mEq/L  Dialysate HCO3 (mEq/L): 35 mEq/L  Blood Flow Rate (mL/min): 340 mL/min  Dialysis Flow (mL/min): 800 mL/min    PHYSICAL EXAM:  Vitals:  Temp:  [36.4 ??C-37.2 ??C] 37.2 ??C  Heart Rate:  [70-132] 132  SpO2 Pulse:  [70] 70  BP: (126-189)/(47-121) 170/107  MAP (mmHg):  [79-123] 117  Weights:  Pre-Treatment Weight (kg):  (UTW /CICU)    General: Appearing in no acute distress  Pulmonary: normal  Cardiovascular: regular rate and rhythm  Extremities: no significant  edema  Access: Right IJ tunneled catheter     Presented to dialysis in: bed/stretcher    LAB DATA:  Lab Results   Component Value Date    NA 139 01/26/2020    K 3.6 01/26/2020    K 3.6 01/26/2020    CL 104 01/26/2020    CO2 26.0 01/26/2020    BUN 23 01/26/2020    CREATININE 3.87 (H) 01/26/2020    CALCIUM 7.9 (L) 01/26/2020    MG 2.6 01/26/2020    PHOS 4.3 08/20/2019    ALBUMIN 2.5 (L) 01/25/2020      Lab Results   Component Value Date    HCT 34.6 (L) 01/26/2020    WBC 10.2 01/26/2020        ASSESSMENT/PLAN:  End Stage Renal Disease on Intermittent Hemodialysis:  UF goal: 2.5L as tolerated  Adjust medications for a GFR <10  Avoid nephrotoxic agents     Bone Mineral Metabolism:  Lab Results   Component Value Date    CALCIUM 7.9 (L) 01/26/2020    CALCIUM 7.8 (L) 01/25/2020    Lab Results   Component Value Date    ALBUMIN 2.5 (L) 01/25/2020    ALBUMIN 3.1 (L) 11/09/2019      Lab Results   Component Value Date    PHOS 4.3 08/20/2019    PHOS 4.3 08/20/2019    Lab Results   Component Value Date    PTH 403.4 (H) 07/25/2019    PTH 395.4 (H) 04/14/2019      Continue phosphorus binder and dietary counseling. Add on UTD phos levels.     Anemia:   Lab Results   Component Value Date    HGB 11.0 (L) 01/26/2020    HGB 10.9 (L) 01/25/2020    HGB 12.5 (L) 11/09/2019    Iron Saturation (%)   Date Value Ref Range Status   07/25/2019 11 (L) 20 - 50 % Final      Lab Results   Component Value Date    FERRITIN 150.0 07/25/2019       Anemia labs appropriate, no changes.    Tonye Royalty, MD  [facility] Division of Nephrology & Hypertension

## 2020-01-27 LAB — BASIC METABOLIC PANEL
ANION GAP: 8 mmol/L (ref 5–14)
BLOOD UREA NITROGEN: 19 mg/dL (ref 9–23)
BUN / CREAT RATIO: 5
CALCIUM: 8.2 mg/dL — ABNORMAL LOW (ref 8.7–10.4)
CHLORIDE: 98 mmol/L (ref 98–107)
CO2: 28 mmol/L (ref 20.0–31.0)
CREATININE: 3.69 mg/dL — ABNORMAL HIGH
EGFR CKD-EPI AA MALE: 21 mL/min/{1.73_m2} — ABNORMAL LOW (ref >=60)
EGFR CKD-EPI NON-AA MALE: 18 mL/min/{1.73_m2} — ABNORMAL LOW (ref >=60)
GLUCOSE RANDOM: 321 mg/dL — ABNORMAL HIGH (ref 70–99)
POTASSIUM: 3.7 mmol/L (ref 3.4–4.5)
SODIUM: 134 mmol/L — ABNORMAL LOW (ref 135–145)

## 2020-01-27 LAB — APTT
APTT: 39.8 s — ABNORMAL HIGH (ref 24.9–36.9)
APTT: 103.7 s — ABNORMAL HIGH (ref 24.9–36.9)
HEPARIN CORRELATION: 0.2
HEPARIN CORRELATION: 0.6

## 2020-01-27 LAB — CBC
HEMATOCRIT: 30.6 % — ABNORMAL LOW (ref 41.0–53.0)
HEMOGLOBIN: 9.8 g/dL — ABNORMAL LOW (ref 13.5–17.5)
MEAN CORPUSCULAR HEMOGLOBIN CONC: 32 g/dL (ref 31.0–37.0)
MEAN CORPUSCULAR HEMOGLOBIN: 28 pg (ref 26.0–34.0)
MEAN CORPUSCULAR VOLUME: 87.7 fL (ref 80.0–100.0)
MEAN PLATELET VOLUME: 9.2 fL (ref 7.0–10.0)
PLATELET COUNT: 221 10*9/L (ref 150–440)
RED BLOOD CELL COUNT: 3.48 10*12/L — ABNORMAL LOW (ref 4.50–5.90)
RED CELL DISTRIBUTION WIDTH: 16 % — ABNORMAL HIGH (ref 12.0–15.0)
WBC ADJUSTED: 9.9 10*9/L (ref 4.5–11.0)

## 2020-01-27 LAB — HEMOGLOBIN AND HEMATOCRIT, BLOOD
HEMATOCRIT: 31.4 % — ABNORMAL LOW (ref 41.0–53.0)
HEMOGLOBIN: 10 g/dL — ABNORMAL LOW (ref 13.5–17.5)

## 2020-01-27 LAB — MAGNESIUM: MAGNESIUM: 1.8 mg/dL (ref 1.6–2.6)

## 2020-01-27 MED ORDER — LANCETS
11 refills | 0.00000 days | Status: CP
Start: 2020-01-27 — End: 2021-01-26

## 2020-01-27 MED ADMIN — aspirin chewable tablet 243 mg: 243 mg | ORAL | @ 13:00:00 | Stop: 2020-01-27

## 2020-01-27 MED ADMIN — heparin (porcine) 1000 unit/mL injection 2,000 Units: 2000 [IU] | INTRAVENOUS | @ 09:00:00 | Stop: 2020-01-27

## 2020-01-27 MED ADMIN — sevelamer (RENVELA) tablet 1,600 mg: 1600 mg | ORAL | @ 21:00:00

## 2020-01-27 MED ADMIN — carvediloL (COREG) tablet 3.125 mg: 3.125 mg | ORAL | @ 01:00:00

## 2020-01-27 MED ADMIN — sevelamer (RENVELA) tablet 1,600 mg: 1600 mg | ORAL | @ 15:00:00

## 2020-01-27 MED ADMIN — heparin (porcine) in NS 10,000 unit/1,000 mL Manifold Flush: @ 13:00:00 | Stop: 2020-01-27

## 2020-01-27 MED ADMIN — gabapentin (NEURONTIN) capsule 300 mg: 300 mg | ORAL | @ 15:00:00

## 2020-01-27 MED ADMIN — sevelamer (RENVELA) tablet 1,600 mg: 1600 mg | ORAL | @ 01:00:00

## 2020-01-27 MED ADMIN — pantoprazole (PROTONIX) EC tablet 40 mg: 40 mg | ORAL | @ 15:00:00

## 2020-01-27 MED ADMIN — fentaNYL (PF) (SUBLIMAZE) injection: INTRAVENOUS | @ 13:00:00 | Stop: 2020-01-27

## 2020-01-27 MED ADMIN — iohexoL (OMNIPAQUE) 300 mg iodine/mL solution: INTRACORONARY | @ 15:00:00 | Stop: 2020-01-27

## 2020-01-27 MED ADMIN — metoprolol succinate (TOPROL-XL) 24 hr tablet 50 mg: 50 mg | ORAL | @ 15:00:00

## 2020-01-27 MED ADMIN — pantoprazole (PROTONIX) EC tablet 40 mg: 40 mg | ORAL | @ 01:00:00

## 2020-01-27 MED ADMIN — FLUoxetine (PROzac) capsule 80 mg: 80 mg | ORAL | @ 21:00:00

## 2020-01-27 MED ADMIN — midazolam (VERSED) injection: INTRAVENOUS | @ 13:00:00 | Stop: 2020-01-27

## 2020-01-27 MED ADMIN — ticagrelor (BRILINTA) tablet 90 mg: 90 mg | ORAL | @ 13:00:00

## 2020-01-27 MED ADMIN — ticagrelor (BRILINTA) tablet 90 mg: 90 mg | ORAL | @ 01:00:00

## 2020-01-27 MED ADMIN — isosorbide mononitrate (IMDUR) 24 hr tablet 30 mg: 30 mg | ORAL | @ 16:00:00

## 2020-01-27 MED ADMIN — heparin (porcine) 1000 unit/mL injection: INTRAVENOUS | @ 14:00:00 | Stop: 2020-01-27

## 2020-01-27 MED ADMIN — atorvastatin (LIPITOR) tablet 80 mg: 80 mg | ORAL | @ 15:00:00

## 2020-01-27 MED ADMIN — insulin lispro (HumaLOG) injection 0-12 Units: 0-12 [IU] | SUBCUTANEOUS | @ 22:00:00

## 2020-01-27 MED ADMIN — amiodarone (PACERONE) tablet 200 mg: 200 mg | ORAL | @ 15:00:00

## 2020-01-27 MED ADMIN — magnesium sulfate 2gm/50mL IVPB: 2 g | @ 15:00:00 | Stop: 2020-01-27

## 2020-01-27 MED ADMIN — iohexoL (OMNIPAQUE) 300 mg iodine/mL solution: @ 15:00:00 | Stop: 2020-01-27

## 2020-01-27 MED ADMIN — heparin 25,000 Units/250 mL (100 units/mL) in 0.45% saline infusion (premade): 12 [IU]/kg/h | INTRAVENOUS | @ 03:00:00

## 2020-01-27 MED ADMIN — magnesium sulfate 2 gram/50 mL (4 %) IVPB: @ 15:00:00 | Stop: 2020-01-27

## 2020-01-27 MED ADMIN — sevelamer (RENVELA) tablet 1,600 mg: 1600 mg | ORAL | @ 17:00:00

## 2020-01-27 MED ADMIN — midazolam (VERSED) injection: INTRAVENOUS | @ 14:00:00 | Stop: 2020-01-27

## 2020-01-27 MED ADMIN — insulin glargine (LANTUS) injection 20 Units: 20 [IU] | SUBCUTANEOUS | @ 02:00:00

## 2020-01-27 MED ADMIN — verapamiL (ISOPTIN) injection: INTRA_ARTERIAL | @ 14:00:00 | Stop: 2020-01-27

## 2020-01-27 MED ADMIN — lidocaine (XYLOCAINE) 20 mg/mL (2 %) injection: SUBCUTANEOUS | @ 13:00:00 | Stop: 2020-01-27

## 2020-01-27 MED ADMIN — ezetimibe (ZETIA) tablet 10 mg: 10 mg | ORAL | @ 01:00:00

## 2020-01-27 MED ADMIN — allopurinoL (ZYLOPRIM) tablet 200 mg: 200 mg | ORAL | @ 15:00:00

## 2020-01-27 MED ADMIN — insulin lispro (HumaLOG) injection 0-12 Units: 0-12 [IU] | SUBCUTANEOUS | @ 02:00:00

## 2020-01-27 MED ADMIN — gabapentin (NEURONTIN) capsule 300 mg: 300 mg | ORAL | @ 01:00:00

## 2020-01-27 MED ADMIN — verapamiL (ISOPTIN) injection: INTRA_ARTERIAL | @ 15:00:00 | Stop: 2020-01-27

## 2020-01-27 NOTE — Unmapped (Signed)
[facility] ESRD Consult Note     Requesting provider: Graciela Husbands  Service requesting consult: cicu  Reason for consult: ESRD, provision of dialysis  Indication for acute dialysis?: No     Outpatient dialysis unit: [place]  Outpatient dialysis schedule: [name]    Assessment/Recommendations: Jeremy Oliver is a/an 49 y.o. male with a past medical history notable for ESRD on HD admitted with NSTEMI.     # ESRD: 4hrs, 2K, 2.5Ca, Na 137, Bicarbonate 35, 36.5C, Dialyzer: 180, Does not receive any heparin . Only received partial treatment yesterday- Will dialyze today in preparation for potential PCI tomorrow.     # Volume/ hypertension: EDW 155kg. Will attempt to achieve dry weight if tolerated. No standing weights available. His bed weight is 165 kg. He denies feeling volume overloaded. No peripheral edema. Has severe abdominal obesity- difficult to tell if he has central volume. Lying flat in bed.     # Anemia of Chronic Kidney Disease: Hemoglobin 10.2. Currently receiving Venofer 100 mg every treatment last dose 10/25. Mircera 60 mcg every 2 weeks (last dose 10/18).     # Secondary Hyperparathyroidism/Hyperphosphatemia: Ca 7.8. No phos levels available. Takes calcitriol 1 mcg every treatment.     # Vascular access: Right IJ non-tunneled catheter      # Hepatitis status: Hepatitis B surface antigen negative on 01/18/20.     # Additional recommendations:  - Avoid nephrotoxic drugs; dose all meds for creatinine clearance < 10 ml/min   - Unless absolutely necessary, no MRIs with gadolinium.   - Implement save arm precautions.  Prefer needle sticks in the dorsum of the hands or wrists.  No blood pressure measurements in arm.  - If blood transfusion is requested during hemodialysis sessions, please alert [place] prior to the session.   - If a hemodialysis catheter line culture is requested, please alert [place] as only hemodialysis nurses are able to collect those specimens.     Recommendations were discussed with the primary team.    **Please contact Quin Hoop PRIOR to hospital discharge so that the nephrology team can arrange appropriate dialysis-related follow-up.**      History of Present Illness: Jeremy Oliver is a/an 49 y.o. 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   S/p STEMI in 2018 with VT arrest leading to ICD placement and DES to LAD.  NSTEMI in 02/2018 with DES x2 to mid RCA and RPLA/RPDA bifurcation.  NSTEMI in 05/2018 with PCI to proximal/mid LAD.  NSTEMI in 09/2018 with PCI to distal RCA.  On 07/2019 had DES x2 to the LAD and left circumflex as well as balloon angioplasty of diagonal 2.  S/p LHC at [facility] on 01/25/2020 noting severe multivessel disease without intervention given high risk.  Transferred to [facility] for consideration of high risk PCI.  Most recent echo in 08/2019 with LVEF 35% and grade 3 diastolic dysfunction. ??During left heart cath at [facility] reportedly had LVEF of approximately 20%.  He reports that hypotensives had to held for dialysis at treatments in last few weeks. He only had 2 hours of dialysis yesterday before transport came. He continues to have chest pain.     Medications:   Current Facility-Administered Medications   Medication  Dose Route Frequency Provider Last Rate Last Admin   ??? acetaminophen (TYLENOL) tablet 650 mg  650 mg Oral Q4H PRN Lazarus Salines, MD       ??? albumin human 25 % bottle 25 g  25 g Intravenous Each time in dialysis PRN Tonye Royalty, MD       ??? allopurinoL (ZYLOPRIM) tablet 200 mg  200 mg Oral Daily Lazarus Salines, MD   200 mg at 01/26/20 1234   ??? amiodarone (PACERONE) tablet 200 mg  200 mg Oral Daily Mack Guise, MD   200 mg at 01/26/20 1234   ??? aspirin chewable tablet 81 mg  81 mg Oral Daily Lazarus Salines, MD   81 mg at 01/26/20 1234   ??? atorvastatin (LIPITOR) tablet 80 mg  80 mg Oral Daily Lazarus Salines, MD   80 mg at 01/26/20 1234   ??? carvediloL (COREG) tablet 3.125 mg  3.125 mg Oral BID Lazarus Salines, MD   3.125 mg at 01/26/20 4540   ??? dextrose 50 % in water (D50W) 50 % solution 12.5 g  12.5 g Intravenous Q10 Min PRN Lazarus Salines, MD       ??? ezetimibe (ZETIA) tablet 10 mg  10 mg Oral Nightly Lazarus Salines, MD   10 mg at 01/26/20 0224   ??? FLUoxetine (PROzac) capsule 80 mg  80 mg Oral Daily Lazarus Salines, MD   80 mg at 01/26/20 1233   ??? gabapentin (NEURONTIN) capsule 300 mg  300 mg Oral BID Lazarus Salines, MD   300 mg at 01/26/20 1258   ??? gentamicin-sodium citrate lock solution in NS  2.2 mL hemodialysis port injection Each time in dialysis PRN Tonye Royalty, MD   2.2 mL at 01/26/20 1935   ??? gentamicin-sodium citrate lock solution in NS  2.3 mL hemodialysis port injection Each time in dialysis PRN Tonye Royalty, MD   2.3 mL at 01/26/20 1935   ??? heparin (porcine) 1000 unit/mL injection 2,000 Units  2,000 Units Intravenous Q6H PRN Lazarus Salines, MD       ??? heparin 25,000 Units/250 mL (100 units/mL) in 0.45% saline infusion (premade)  12 Units/kg/hr Intravenous Continuous Lazarus Salines, MD 19.8 mL/hr at 01/26/20 1400 12 Units/kg/hr at 01/26/20 1400   ??? insulin glargine (LANTUS) injection 37 Units  37 Units Subcutaneous Nightly Lazarus Salines, MD       ??? insulin lispro (HumaLOG) injection 0-12 Units  0-12 Units Subcutaneous ACHS Lazarus Salines, MD   2 Units at 01/26/20 1258   ??? isosorbide mononitrate (IMDUR) 24 hr tablet 30 mg  30 mg Oral Daily Lazarus Salines, MD       ??? melatonin tablet 3 mg  3 mg Oral Nightly PRN Lazarus Salines, MD       ??? nitroglycerin (NITROSTAT) SL tablet 0.4 mg  0.4 mg Sublingual Q5 Min PRN Lazarus Salines, MD   0.4 mg at 01/25/20 2256   ??? pantoprazole (PROTONIX) EC tablet 40 mg  40 mg Oral BID Lazarus Salines, MD   40 mg at 01/26/20 1234   ??? sevelamer (RENVELA) tablet 1,600 mg  1,600 mg Oral 3xd Meals Lazarus Salines, MD   1,600 mg at 01/26/20 1235   ??? ticagrelor (BRILINTA) tablet 90 mg  90 mg Oral BID Mack Guise, MD   90 mg  at 01/26/20 1233        ALLERGIES  Losartan and Opioids - morphine analogues    MEDICAL HISTORY  Past Medical History:   Diagnosis Date   ??? Angina at rest (CMS-[name])    ??? Arthritis    ??? Asthma    ??? Atrial fibrillation and flutter (CMS-[name]) 06/08/14   ??? BMI 40.0-44.9, adult (CMS-[name]) 10/29/2015   ??? Chronic anticoagulation 02/17/2018   ??? Chronic pain syndrome 04/11/2014    ~Use daily lyrica as recommended ~Tramadol as needed for pain ~Fluoxetine daily as recommended ~Daily exercise ~Eat a healthy diet ~Good control of your blood sugars can help    ??? Coronary artery disease    ??? Depression    ??? Diabetes mellitus (CMS-[name])    ??? ESRD (end stage renal disease) on dialysis (CMS-[name])    ??? Eye trauma 1998    glass in both eyes and removed   ??? GERD (gastroesophageal reflux disease)    ??? Gout    ??? Homeless    ??? Hyperlipidemia    ??? Hypertension    ??? Illiteracy and low-level literacy    ??? Microscopic hematuria    ??? Migraine    ??? Nephrolithiasis    ??? 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 Nonalcoholic fatty liver disease    ??? NSTEMI (non-ST elevated myocardial infarction) (CMS-[name]) 01/13/2017   ??? Obesity    ??? Obstructive sleep apnea 2009   ??? Panic attacks    ??? 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 Seizure-like activity (CMS-[name])     ~Monitor for symptoms and report them to your primary care provider if they occur    ??? Systolic CHF, chronic (CMS-[name])     EF 40-45% 2017   ??? TIA (transient ischemic attack)         SOCIAL HISTORY  Social History     Socioeconomic History   ??? Marital status: Married     Spouse name: [name]   ??? Number of children: 3   ??? Years of education: 34   ??? Highest education level: Not on file   Occupational History   ??? Occupation: DISABLED     Comment: Former IT sales professional   Tobacco Use   ??? Smoking status: Never Smoker   ??? Smokeless tobacco: Current User     Types: Chew   ??? Tobacco comment: 08/15/19:  Dips 1x a day./ /Pt in process of reducing tobacco use,  dips 1 can every 3 weeks,   Vaping Use   ??? Vaping Use: Never used   Substance and Sexual Activity   ??? Alcohol use: Yes     Alcohol/week: 0.0 standard drinks     Comment: rare use: couple times a year, small amount   ??? Drug use: Never   ??? Sexual activity: Not Currently     Partners: Female   Other Topics Concern   ??? Do you use sunscreen? No   ??? Tanning bed use? No   ??? Are you easily burned? No   ??? Excessive sun exposure? No   ??? Blistering sunburns? No  Social History Narrative    Information updated:  01/08/16. Reviewed last: 12/01/18, 08/15/19        Born in [place]    Raised with both parents    Parents died w/in 6 mos of each other. His parents knew they were going to pass and told him just before.    Half sister and 2 half brothers        Married 31 yrs. Wife [name]    2 other children son and daughter, adopted out    Daughter Bridgette, bf DJ        Education - completed through 11th. 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    Former Presenter, broadcasting since age 56    Lives with wife, difficulty paying bills 12/30/17     Lives next door to daughter, husband and their grandson        05/19/18: pt moving to new home in next 5 days- after an eviction. Has Medicaid assistance again. Will be w/ wife [name], they babysit their grandson often.    06/14/18: Pt and husband living with daughter while new location to live is determined.    12/01/18: pt living with wife and friends. Friends have two young children. [name] states soon they will have their own place, however doesn't know when. Pt states it will be after the neighbors are evicted, we can move in. Pt will need to start dialysis soon. Pt and wife Not talking with daughter and grandson lately.      Social Determinants of Health     Financial Resource Strain: Low Risk    ??? Difficulty of Paying Living Expenses: Not very hard   Food Insecurity: Food Insecurity Present   ??? Worried About Programme researcher, broadcasting/film/video in the Last Year: Sometimes true   ??? Ran Out of Food in the Last Year: Sometimes true   Transportation Needs: No Transportation Needs   ??? Lack of Transportation (Medical): No   ??? Lack of Transportation (Non-Medical): No   Physical Activity:    ??? Days of Exercise per Week:    ??? Minutes of Exercise per Session:    Stress: Stress Concern Present   ??? Feeling of Stress : Rather much   Social Connections: Moderately Isolated   ??? Frequency of Communication with Friends and Family: More than three times a week   ??? Frequency of Social Gatherings with Friends and Family: Twice a week   ??? Attends Religious Services: Never   ??? Active Member of Clubs or Organizations: No   ??? Attends Banker Meetings: Never   ??? Marital Status: Married        FAMILY HISTORY  Family History   Problem Relation Age of Onset   ??? Diabetes Mother    ??? Hyperlipidemia Mother    ??? Heart disease Mother    ??? Basal cell carcinoma Mother    ??? Blindness Mother         diabeties   ??? Obesity Mother    ??? Hyperlipidemia Father    ??? Heart disease Father    ??? Lung disease Father    ??? Heart disease Half-Sister    ??? Kidney disease Half-Sister    ??? Gallbladder disease Half-Brother    ??? Obesity Half-Brother    ???  No Known Problems Daughter    ??? No Known Problems Son    ??? Obesity Daughter    ??? Depression Maternal Uncle         Died by suicide in 2017-10-07 by gunshot   ??? Melanoma Neg Hx    ??? Squamous cell carcinoma Neg Hx    ??? Macular degeneration Neg Hx    ??? Glaucoma Neg Hx         Review of Systems:  A 12 system review of systems was negative except as noted in HPI.    Physical Exam:  Vitals:    01/26/20 2000   BP: 170/107   Pulse: 132   Resp: 18   Temp: 37.2 ??C   SpO2: 99%     No intake/output data recorded.    Intake/Output Summary (Last 24 hours) at 01/26/2020 08-Oct-2038  Last data filed at 01/26/2020 1835  Gross per 24 hour   Intake 1100.77 ml   Output 2900 ml   Net -1799.23 ml     General: well-appearing, no acute distress  HEENT: anicteric sclera  CV: RRR, no m/r/g, no edema  Lungs: CTAB, normal wob  Abd: soft, non-tender, non-distended  Skin: no visible lesions or rashes  Psych: alert, engaged, appropriate mood and affect  Neuro: normal speech, no gross focal deficits     Test Results  Reviewed  Lab Results   Component Value Date    NA 139 01/26/2020    K 3.6 01/26/2020    K 3.6 01/26/2020    CL 104 01/26/2020    CO2 26.0 01/26/2020    BUN 23 01/26/2020    CREATININE 3.87 (H) 01/26/2020    GFR >= 60 01/09/2012    GLU 252 (H) 01/26/2020    CALCIUM 7.9 (L) 01/26/2020    ALBUMIN 2.5 (L) 01/25/2020    PHOS 4.3 08/20/2019       I have reviewed relevant outside healthcare records

## 2020-01-27 NOTE — Unmapped (Signed)
Mr. Woodyard remains ICU status. IHD was finishing up at the beginning of night shift (T, Th, Sat). HD RN reported 2.5 liters removed over 3.5 hours. He ate  dinner and another pm meal. Mr. Slifer was made NPO after midnight in preparation for left heart cath today with possible high risk PCI. PM lantus dose decreased to 20 units sq(down from 37 units).   CBG covered per [facility] schedule. Left foot  drop noted while walking. Patient reports happened during previous CABG. Please see Epic charting for details of assessment, lab results and vital signs. Will continue to monitor and assess.      Problem: Adult Inpatient Plan of Care  Goal: Plan of Care Review  Outcome: Ongoing - Unchanged  Goal: Patient-Specific Goal (Individualized)  Outcome: Ongoing - Unchanged  Goal: Absence of Hospital-Acquired Illness or Injury  Outcome: Ongoing - Unchanged  Intervention: Identify and Manage Fall Risk  Recent Flowsheet Documentation  Taken 01/26/2020 2000 by Salem Caster, RN  Safety Interventions:   aspiration precautions   bleeding precautions   environmental modification   infection management   lighting adjusted for tasks/safety   low bed   nonskid shoes/slippers when out of bed  Intervention: Prevent Skin Injury  Recent Flowsheet Documentation  Taken 01/26/2020 2000 by Salem Caster, RN  Skin Protection:   adhesive use limited   tubing/devices free from skin contact  Intervention: Prevent and Manage VTE (Venous Thromboembolism) Risk  Recent Flowsheet Documentation  Taken 01/27/2020 0000 by Salem Caster, RN  Activity Management: activity adjusted per tolerance  Taken 01/26/2020 2200 by Salem Caster, RN  Activity Management: activity adjusted per tolerance  Taken 01/26/2020 2000 by Salem Caster, RN  Activity Management: activity adjusted per tolerance  Intervention: Prevent Infection  Recent Flowsheet Documentation  Taken 01/26/2020 2000 by Salem Caster, RN  Infection Prevention:   environmental surveillance performed equipment surfaces disinfected   hand hygiene promoted   personal protective equipment utilized   rest/sleep promoted   single patient room provided  Goal: Optimal Comfort and Wellbeing  Outcome: Ongoing - Unchanged  Goal: Readiness for Transition of Care  Outcome: Ongoing - Unchanged  Goal: Rounds/Family Conference  Outcome: Ongoing - Unchanged

## 2020-01-27 NOTE — Unmapped (Signed)
Brief Operative Note  (CSN: [id number])      Date of Surgery: 01/27/2020    Pre-op Diagnosis: NSTEMI, Complex CAD, left main/LAD interventino    Post-op Diagnosis: Same    Procedure(s):  Percutaneous Coronary Intervention: 86578 (CPT??)  Note: Revisions to procedures should be made in chart - see Procedures activity.    Performing Service: Cardiology  Surgeon(s) and Role:     * Marlaine Hind, MD - Primary     * Armando Reichert, MD - Fellow - Interventional    Assistant: None    Findings:   Bifurcation PCI of LMCA/LCX/LAD with Synergy DES x1 to LMCA/LAD, PTCA only to ostial/proximal LCx.  IVUS was performed to evaluate stent expansion and vessel size, confirmed 4.0 mm LMCA, 3.0 mm proximal LAD    Anesthesia: Conscious Sedation (Nurse Admin)    Estimated Blood Loss: 20 ml    Complications: None    Specimens: None collected    Implants:   Implant Name Type Inv. Item Serial No. Manufacturer Lot No. LRB No. Used Action   STENT SYNERGY [id number] [place] MR - [id number] Stent STENT SYNERGY 3X16 XD [place] MR [id number] [employer] [id number] N/A 1 Implanted       Surgeon Notes: I was present and scrubbed for the entire procedure    Elpidio Anis   Date: 01/27/2020  Time: 1:26 PM

## 2020-01-27 NOTE — Unmapped (Cosign Needed)
Cardiac Catheterization Laboratory  [place], [place]  Tel: [phone]     Fax: [phone]       HISTORY & PHYSICAL ASSESSMENT    PCP:  Kurtis Bushman, MD  Phone:  [phone]  Fax:  [phone]    Referring Physicians:  Dalia Heading, Md  [address]  [facility] ??? Cardiology  [place],  [place] [address]     Primary Cardiologist:  Eppie Gibson    History:    Jeremy Route Parrella is a 49 y.o. male with a past medical history significant for ESRD on dialysis (T/TH/S), CAD??s/p??multiple PCIs,??hx of VT after STEMI in 2018 s/p ICD,??T2DM, HTN, HLD, HFrEF (echo 08/2019??EF 35% w G3DD),??TIA, paroxysmal a-fib??on??Apixaban, OSA on CPAP, non-epileptic seizures??presenting with chest pain and troponin elevation consistent with NSTEMI.    hypertension  hyperlipidemia  diabetes mellitus  currently on dialysis    Previous smoker; Quit in 2018, smoked about 1 pack per day.    Prior PCI; date 07/2019    Prior MI; date 2018    No known history of prior CABG.    Previously diagnosed Class III systolic heart failure.     No cardiac arrest surrounding this admission.      Assessments:    ECG :  A paced with AV blok. LVH with repolarization.     Stress Test : No stress test performed    No new antiarrhythmic therapy initiated prior to cath lab.    No cardiac CTA performed    Prior angiogram WITHOUT intervention demonstrated obstructive CAD on the date of 01/25/2020    An EF of 30% was obtained on 01/26/20.     No Agatston coronary calcium score was assessed.    CSHA Clinical Frailty Scale : 6 - Moderately Frail    Chest Pain Assessment : typical angina     Cardiovascular Instability : acute heart failure symptoms    Medications Administered : (pre-procedure)  Aspirin  Beta Blockers  Statin    Medications Contraindicated :   None documented      MEDICATIONS, ALLERGIES, SOCIAL HISTORY, and FAMILY HISTORY were reviewed and documented above.    REVIEW OF SYSTEMS  A comprehensive 12+ system review of systems was negative than otherwise noted above.     Objective:      OBJECTIVE  BP 174/71  - Pulse 70  - Temp 36.7 ??C (Axillary)  - Resp 23  - Wt (!) 165 kg (363 lb 12.1 oz)  - SpO2 98%  - BMI 46.70 kg/m??   PHYSICAL EXAMINATION:   GENERAL:  Alert, NAD  EYES: Sclerae clear, EOMI b/l  ENT:  OP clear w/o exudate  NECK: Supple  CARDIOVASCULAR:  Regular rate and rhythm, normal S1/S2, no murmurs, rubs, or gallops. Femoral pulses are 1+; radial pulses are 1+  RESPIRATORY:  Clear to auscultation bilaterally.    ABDOMEN/GI:  Soft, non-distended  NEUROLOGIC: alert and appropriately conversational  SKIN: No rashes  PSYCH:  Normal mental status, mood, and affect.      Recent CV pertinent labs:  Lab Results   Component Value Date    Sodium 134 (L) 01/27/2020    Sodium Whole Blood 132 (L) 03/25/2017    Sodium, POCT 134 (L) 07/25/2019    Potassium 3.7 01/27/2020    Potassium, Bld 5.5 (H) 03/25/2017    Potassium, POCT 5.3 (H) 07/25/2019    Chloride 98 01/27/2020    Chloride, POCT 103 07/25/2019    CO2  28.0 01/27/2020    CO2, POCT 19 (L) 07/25/2019    BUN 19 01/27/2020    BUN, POCT 56 (H) 07/25/2019    Creatinine, POCT 5.8 (H) 07/25/2019    Creatinine, POCT 6.2 (H) 07/22/2019    Creatinine, POCT 5.9 (H) 07/20/2019    Creatinine 3.69 (H) 01/27/2020    Creatinine 3.87 (H) 01/26/2020    Creatinine 3.50 (H) 01/25/2020    EGFR Non-African American 59 (L) 06/09/2014    Glucose 321 (H) 01/27/2020    Glucose, POCT 212 (H) 07/25/2019    Calcium 8.2 (L) 01/27/2020    Calcium 8.3 (L) 06/09/2014    Magnesium 1.8 01/27/2020    Magnesium 2.0 06/09/2014    Phosphorus 4.3 08/20/2019    Phosphorus 4.0 05/12/2011    HGB 9.8 (L) 01/27/2020    Hemoglobin 8.1 (L) 03/25/2017    HCT 30.6 (L) 01/27/2020    HCT 37.9 (L) 06/09/2014    Platelet 221 01/27/2020    Platelet 190 06/09/2014    Total Protein 6.5 01/25/2020    Total Protein 7.4 06/08/2014    Albumin 2.5 (L) 01/25/2020    Albumin 4.3 06/08/2014    Total Bilirubin 0.3 01/25/2020    Total Bilirubin 0.8 06/08/2014    Bilirubin, Direct 0.10 08/18/2019    Bilirubin, Direct 0.2 07/21/2012    AST 10 01/26/2020    AST 37 06/08/2014    ALT 7 (L) 01/25/2020    ALT 36 06/08/2014    Alkaline Phosphatase 99 01/25/2020    Alkaline Phosphatase 98 06/08/2014    BNP 511.30 (H) 11/09/2019    PRO-BNP 8,740.0 (H) 09/14/2019    PRO-BNP 7,270.0 (H) 08/18/2019    PRO-BNP 5,460.0 (H) 07/25/2019    PRO-BNP 231 (H) 06/08/2014    LDL Calculated 137 (H) 04/14/2019    LDL Direct 239 08/30/2012    LDL Cholesterol, Calculated 264 08/30/2012    Non-HDL Cholesterol 174 04/14/2019    HDL 39 (L) 04/14/2019    HDL 35 (L) 08/30/2012    INR, POC 1.2 02/12/2018    INR, POC 0.9 02/01/2018    INR, POC 1.1 01/14/2018    INR 1.05 01/25/2020    INR 1.01 11/09/2019    INR 1.29 07/26/2019    APTT 39.8 (H) 01/27/2020    APTT 35.0 10/16/2013    Troponin I <0.060 09/14/2019    Troponin I 0.058 (HH) 08/19/2019    Troponin I 0.065 (HH) 08/18/2019    Troponin I <0.060 06/09/2014    Troponin I <0.060 06/08/2014    Troponin I <0.034 09/15/2012    hsTroponin I 2,331 (HH) 01/26/2020    hsTroponin I 2,397 (HH) 01/26/2020    hsTroponin I 2,309 (HH) 01/25/2020    Creatine Kinase, Total 45.0 (L) 05/25/2015    Creatine Kinase, Total 44.0 (L) 08/14/2014    Creatine Kinase, Total 40 (L) 09/15/2012    Creatine Kinase, Total 52 (L) 06/09/2011    CK-MB 0.54 05/25/2015    CK-MB 0.52 08/14/2014    CK-MB <0.2 09/15/2012    CK-MB 0.3 06/09/2011    TSH 8.257 (H) 01/25/2020    TSH 2.29 06/08/2014           ASSESSMENT AND PLAN:   Jeremy Oliver is a 49 y.o. male with the above stated history who has been referred for PCI of LMCA. The patient signed informed consent for the procedure after a discussion about the procedure, its risks, potential benefits, and alternatives. We will plan for a RRA approach.

## 2020-01-27 NOTE — Unmapped (Signed)
HEMODIALYSIS NURSE PROCEDURE NOTE    Treatment Number:  1 Room/Station:  Critical Care (Specify Unit & Room) (cicu [id number]) Procedure Date:  01/26/20   Total Treatment Time:  216 Min.    CONSENT:  Written consent was obtained prior to the procedure and is detailed in the medical record. Prior to the start of the procedure, a time out was taken and the identity of the patient was confirmed via name, medical record number and date of birth.     WEIGHTS:  Hemodialysis Pre-Treatment Weights     Date/Time Pre-Treatment Weight (kg) Estimated Dry Weight (kg) Patient Goal Weight (kg) Total Goal Weight (kg)    01/26/20 1514  ??? UTW /CICU  160 kg (352 lb 11.8 oz)  3 kg (6 lb 9.8 oz)  3.55 kg (7 lb 13.2 oz)           Hemodialysis Post Treatment Weights     Date/Time Post-Treatment Weight (kg) Treatment Weight Change (kg)    01/26/20 1835  ??? utw/cicu  ???        Active Dialysis Orders (168h ago, onward)     Start     Ordered    01/26/20 0953  Hemodialysis inpatient  Every Tue,Thu,Sat     Question Answer Comment   K+ 2 meq/L    Ca++ 2.5 meq/L    Bicarb 35 meq/L    Na+ 137 meq/L    Na+ Modeling none    Dialyzer [id number]    Dialysate Temperature (C) 36.5    BFR-As tolerated to a maximum of: 400 mL/min    DFR 800 mL/min    Duration of treatment 4 Hr 3.5 hr on 10/28   Dry weight (kg) 160 kg ?    Challenge dry weight (kg) no    Fluid removal (L) 3 L    Tubing Adult = 142 ml    Access Site Dialysis Catheter    Access Site Location Right    Keep SBP >: 100        01/26/20 0952              ACCESS SITE:       Hemodialysis Catheter 07/27/19 Right Internal jugular 2.2 mL 2.3 mL (Active)   Site Assessment Clean;Dry;Intact 01/26/20 1935   Proximal Lumen Status / Patency Capped 01/26/20 1935   Proximal Lumen Intervention Deaccessed 01/26/20 1935   Medial Lumen Status / Patency Capped 01/26/20 1935   Medial Lumen Intervention Deaccessed 01/26/20 1935   Dressing Intervention No intervention needed 01/26/20 1935   Dressing Status Clean;Dry;Intact/not removed 01/26/20 1935   Verification by X-ray No 01/26/20 1935   Site Condition No complications 01/26/20 1935   Dressing Type CHG gel;Occlusive 01/26/20 1935   Dressing Change Due 02/01/20 01/26/20 1935   Line Necessity Reviewed? Y 01/26/20 1935   Line Necessity Indications Yes - Hemodialysis 01/26/20 1935   Line Necessity Reviewed With renal 01/26/20 1935           Catheter Fill Volumes:  Arterial:  2.2 mL Venous:  2.3 mL   Catheter filled with gentamycin mg Citrate post procedure.    Patient Lines/Drains/Airways Status    Active Peripheral & Central Intravenous Access     Name:   Placement date:   Placement time:   Site:   Days:    Peripheral IV 01/23/20 Left Antecubital   01/23/20    0000    Antecubital   3    Peripheral IV 01/23/20 Anterior;Distal;Right;Upper Arm   01/23/20  0000    Arm   3    Peripheral IV 01/23/20 Anterior;Left Forearm   01/23/20    0000    Forearm   3              LAB RESULTS:  Lab Results   Component Value Date    NA 139 01/26/2020    K 3.6 01/26/2020    K 3.6 01/26/2020    CL 104 01/26/2020    CO2 26.0 01/26/2020    BUN 23 01/26/2020    CREATININE 3.87 (H) 01/26/2020    GLU 252 (H) 01/26/2020    GLUF 223 (H) 06/09/2014    CALCIUM 7.9 (L) 01/26/2020    CAION 4.57 01/25/2020    PHOS 4.3 08/20/2019    MG 2.6 01/26/2020    PTH 403.4 (H) 07/25/2019    IRON 32 (L) 07/25/2019    LABIRON 11 (L) 07/25/2019    TRANSFERRIN 223.8 07/25/2019    FERRITIN 150.0 07/25/2019    TIBC 282.0 07/25/2019     Lab Results   Component Value Date    WBC 10.2 01/26/2020    HGB 11.0 (L) 01/26/2020    HCT 34.6 (L) 01/26/2020    PLT 215 01/26/2020    PHART 7.31 (L) 04/07/2017    PO2ART 124.0 (H) 04/07/2017    PCO2ART 44.3 04/07/2017    HCO3ART 22 04/07/2017    BEART -3.4 (L) 04/07/2017    O2SATART 98.9 04/07/2017    APTT 45.0 (H) 01/26/2020        VITAL SIGNS:  Temperature     Date/Time Temp Temp src      01/26/20 1935  36.7 ??C (98.1 ??F)  Oral         Hemodynamics     Date/Time Pulse BP MAP (mmHg) Arterial Line BP    01/26/20 1935  102  158/77  ??? --    01/26/20 1930  105  188/89  ??? --    01/26/20 1915  78  183/107  ??? --    01/26/20 1900  104  185/102  ??? --    01/26/20 1847  115  ???  ??? --    01/26/20 1845  102  178/71  ??? --    01/26/20 1830  101  152/94  ??? --    01/26/20 1815  70  167/82  ??? --    01/26/20 1800  73  147/121  ??? --    01/26/20 1745  72  151/114  ??? --    01/26/20 1730  74  159/101  ??? --    01/26/20 1715  75  145/80  ??? --    01/26/20 1700  ???  155/74  ??? --    01/26/20 1645  72  173/99  ??? --    01/26/20 1630  73  146/98  ??? --    01/26/20 1615  70  168/80  ??? --    Date/Time Arterial Line MAP Arterial Line BP 2 Arterial Line MAP Patient Position    01/26/20 1935 -- -- --  Lying    01/26/20 1930 -- -- --  Lying    01/26/20 1915 -- -- --  Lying    01/26/20 1900 -- -- --  Lying    01/26/20 1847 -- -- --  ???    01/26/20 1845 -- -- --  Lying    01/26/20 1830 -- -- --  Lying    01/26/20 1815 -- -- --  Lying  01/26/20 1800 -- -- --  Lying    01/26/20 1745 -- -- --  Lying    01/26/20 1730 -- -- --  Lying    01/26/20 1715 -- -- --  Lying    01/26/20 1700 -- -- --  Lying    01/26/20 1645 -- -- --  Lying    01/26/20 1630 -- -- --  Lying    01/26/20 1615 -- -- --  Lying        Blood Volume Monitor     Date/Time Blood Volume Change (%) HCT HGB Critline O2 SAT %    01/26/20 1930  -6.5 %  32  10.9  58    01/26/20 1915  -5.4 %  31.8  10.8  63    01/26/20 1900  -5.4 %  31.8  10.8  63    01/26/20 1847  -5.3 %  31.8  10.8  63    01/26/20 1845  -3.6 %  31.3  10.7  63    01/26/20 1830  -4.1 %  31.5  10.7  63    01/26/20 1815  -4.6 %  31.5  10.7  63    01/26/20 1800  -4.3 %  31.5  10.7  63    01/26/20 1745  -4 %  31  10.6  58    01/26/20 1730  -4 %  31  10.6  58    01/26/20 1715  -3.3 %  31  10.6  57    01/26/20 1700  -2.6 %  31  10.5  58    01/26/20 1645  -2.6 %  31  10.5  58    01/26/20 1630  -2.5 %  30  10.5  58        Oxygen Therapy     Date/Time Resp SpO2 O2 Device FiO2 (%) O2 Flow Rate (L/min)    01/26/20 1935  12  93 %  Nasal cannula  ???  2 L/min    01/26/20 1930  9  100 %  Nasal cannula  ???  2 L/min    01/26/20 1915  19  100 %  Nasal cannula  ???  2 L/min    01/26/20 1900  18  99 %  Nasal cannula  ???  2 L/min    01/26/20 1847  18  98 %  Nasal cannula  ???  2 L/min    01/26/20 1845  16  98 %  Nasal cannula  ???  2 L/min    01/26/20 1830  17  ???  ???  ???  ???    01/26/20 1815  15  ???  ???  ???  ???    01/26/20 1800  11  ???  ???  ???  ???    01/26/20 1745  11  ???  ???  ???  ???    01/26/20 1730  17  ???  ???  ???  ???    01/26/20 1715  15  ???  ???  ???  ???    01/26/20 1645  17  ???  ???  ???  ???    01/26/20 1630  14  ???  None (Room air)  ???  ???    01/26/20 1615  19  ???  None (Room air)  ???  ???        Oxygen Connected to Wall:  yes    Pre-Hemodialysis Assessment     Date/Time Therapy Number Dialyzer All Research scientist (physical sciences)  Detector Dialysis Flow (mL/min)    01/26/20 1514  1  F-180 (98 mLs)  Yes  Engaged  800 mL/min    Date/Time Verify Priming Solution Priming Volume Hemodialysis Independent pH Hemodialysis Machine Conductivity (mS/cm) Hemodialysis Independent Conductivity (mS/cm)    01/26/20 1514  0.9% NS  300 mL  ??? n/a  13.6 mS/cm  13.5 mS/cm    Date/Time Bicarb Conductivity Residual Bleach Negative Free Chlorine Total Chlorine Chloramine    01/26/20 1514 --  Yes --  0 --        Pre-Hemodialysis Treatment Comments     Date/Time Pre-Hemodialysis Comments    01/26/20 1514  alert & stable        Hemodialysis Treatment     Date/Time Blood Flow Rate (mL/min) Arterial Pressure (mmHg) Venous Pressure (mmHg) Transmembrane Pressure (mmHg)    01/26/20 1935  340 mL/min  -230 mmHg  210 mmHg  20 mmHg    01/26/20 1930  340 mL/min  -230 mmHg  210 mmHg  20 mmHg    01/26/20 1915  340 mL/min  -230 mmHg  210 mmHg  20 mmHg    01/26/20 1900  340 mL/min  -230 mmHg  210 mmHg  20 mmHg    01/26/20 1845  340 mL/min  -230 mmHg  210 mmHg  20 mmHg    01/26/20 1830  350 mL/min  -250 mmHg  220 mmHg  20 mmHg    01/26/20 1815  400 mL/min  -250 mmHg  220 mmHg  20 mmHg    01/26/20 1800  400 mL/min  -250 mmHg  220 mmHg 20 mmHg    01/26/20 1745  400 mL/min  -250 mmHg  220 mmHg  20 mmHg    01/26/20 1730  400 mL/min  -250 mmHg  230 mmHg  20 mmHg    01/26/20 1715  400 mL/min  -250 mmHg  230 mmHg  20 mmHg    01/26/20 1700  400 mL/min  -250 mmHg  230 mmHg  20 mmHg    01/26/20 1645  400 mL/min  -250 mmHg  230 mmHg  20 mmHg    01/26/20 1630  400 mL/min  -250 mmHg  230 mmHg  20 mmHg    01/26/20 1615  400 mL/min  -250 mmHg  230 mmHg  20 mmHg    01/26/20 1559  400 mL/min  -250 mmHg  210 mmHg  20 mmHg    Date/Time Ultrafiltration Rate (mL/hr) Ultrafiltrate Removed (mL) Dialysate Flow Rate (mL/min) KECN (Kecn)    01/26/20 1935  790 mL/hr  3050 mL  800 ml/min  ???    01/26/20 1930  790 mL/hr  2850 mL  800 ml/min  ???    01/26/20 1915  790 mL/hr  2625 mL  800 ml/min  ???    01/26/20 1900  790 mL/hr  2460 mL  800 ml/min  ???    01/26/20 1845  790 mL/hr  2251 mL  800 ml/min  ???    01/26/20 1830  770 mL/hr  2160 mL  800 ml/min  ???    01/26/20 1815  1010 mL/hr  1920 mL  800 ml/min  ???    01/26/20 1800  1010 mL/hr  1810 mL  800 ml/min  ???    01/26/20 1745  1010 mL/hr  1621 mL  800 ml/min  ???    01/26/20 1730  1010 mL/hr  1410 mL  800 ml/min  ???    01/26/20 1715  1010 mL/hr  1207 mL  800 ml/min  ???    01/26/20 1700  1010 mL/hr  1000 mL  800 ml/min  ???    01/26/20 1645  1010 mL/hr  720 mL  800 ml/min  ???    01/26/20 1630  1010 mL/hr  480 mL  800 ml/min  ???    01/26/20 1615  1010 mL/hr  320 mL  800 ml/min  ??? n/a    01/26/20 1559  1010 mL/hr  10 mL  800 ml/min  ??? n/a        Hemodialysis Treatment Comments     Date/Time Intra-Hemodialysis Comments    01/26/20 1935  will eat dinner, no futher complain    01/26/20 1930  on phone    01/26/20 1915  awake, no pain    01/26/20 1900  awake, no further complain,Renal Dr. Cassie Freer aware    01/26/20 1847  seen by Prim, RN, no nitro per team, on & off chest pain whole day, pt wants to finish HD. o2 given per [place],     01/26/20 1845  complain chest pain, called Prim, dec uf     01/26/20 1830  pt.verbalizes not feeling right, drop head of bed, dec uf    01/26/20 1815  pt. watching TV    01/26/20 1800  pt. emotional. support given    01/26/20 1745  family called pt    01/26/20 1730  family called Pt    01/26/20 1715  pt. pleasant to staff    01/26/20 1700  pt. watching tv    01/26/20 1645  pt. watching TV    01/26/20 1630  seen by Renal.Dr.Jawa & Manivana    01/26/20 1615  pt. watching TV    01/26/20 1559  alert & stable,seen by Cardiac team        Post Treatment     Date/Time Rinseback Volume (mL) On Line Clearance: spKt/V Total Liters Processed (L/min) Dialyzer Clearance    01/26/20 1835  300 mL  0.66 spKt/V  65.4 L/min  Moderately streaked          Post Hemodialysis Treatment Comments     Date/Time Post-Hemodialysis Comments    01/26/20 1835  alert & stable        POST TREATMENT ASSESSMENT:  General appearance:  alert  Neurological:  Alert and oriented X 3, normal strength and tone. Normal symmetric reflexes. Normal coordination and gait  Lungs:  diminished breath sounds anterior - bilateral  Hearts:  regular rate and rhythm, S1, S2 normal, no murmur, click, rub or gallop  Abdomen:  soft, non-tender; bowel sounds normal; no masses,  no organomegaly  Pulses:  2+ and symmetric.  Skin:  Skin color, texture, turgor normal. No rashes or lesions    Hemodialysis I/O     Date/Time Total Hemodialysis Replacement Volume (mL) Total Ultrafiltrate Output (mL)    01/26/20 1835  ??? n/a  2500 mL        [id number] - Medicaitons Given During Treatment  (last 4 hrs)         CHRISTINA M ARMITAGE, RN       Medication Name Action Time Action Route Rate Dose User     heparin 25,000 Units/250 mL (100 units/mL) in 0.45% saline infusion (premade) 01/26/20 1926 Handoff Intravenous 18.15 mL/hr 11 Units/kg/hr Dennie Maizes, RN          Tobey Schmelzle P Deakon Frix, RN       Medication Name Action Time Action Route Rate Dose User     gentamicin-sodium  citrate lock solution in NS 01/26/20 1935 Given hemodialysis port injection  2.2 mL Antwon Rochin P Marthena Whitmyer, RN     gentamicin-sodium citrate lock solution in NS 01/26/20 1935 Given hemodialysis port injection  2.3 mL Damarian Priola P Jaton Eilers, RN                  Patient tolerated treatment in a  Bed.

## 2020-01-27 NOTE — Unmapped (Cosign Needed)
CICU Progress Note   Hospital Day: 3     Hospital Course:  01/23/20: Presented to [facility] with symptoms of chest pain similar to prior MI. Troponin was rising (500 --> 5000). Diagnosed with NSTEMI.   01/25/20 LHC at [facility]: diffuse 80% left main stenosis, 90% ostial LAD, severely dilated cardiomyopathy with EF around 20%. Completed HD, transferred to [facility]  01/26/20: admitted to CICU at [facility], tolerated HD   01/27/20: LHC, PCI w/ DES to LMCA/LAD.    Subjective / Interval History:    Slept well on CPAP, had CP during HD 5/10 yesterday evening but no CP overnight.  This am he is post-procedure PCI to the LM/LMCA which went well.     Assessment/Plan:        Principal Problem:    NSTEMI (non-ST elevation myocardial infarction) (CMS-[name])  Active Problems:    Coronary artery disease of native artery of native heart with stable angina pectoris (CMS-[name])    Idiopathic chronic gout of left knee    Hypercholesteremia    Diabetes mellitus with coincident hypertension (CMS-[name])    Obesity, Class III, BMI 40-49.9 (morbid obesity) (CMS-[name])    GERD (gastroesophageal reflux disease)    Paroxysmal atrial fibrillation (CMS-[name])    OSA (obstructive sleep apnea)    Recurrent major depressive disorder, in full remission (CMS-[name])    HFrEF (heart failure with reduced ejection fraction) (CMS-[name])    Chronic anticoagulation    ESRD needing dialysis (CMS-[name])  Resolved Problems:    * No resolved hospital problems. *      Jeremy Oliver is a 49 y.o. 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     Neurological   Hx of seizures  No recent issues, is not able to elaborate further.     Hx of TIA  No deficits this admission    Depression  Continue home Prozac. Left Foot drop (chronic)  Reports weakness since ECMO in 2018  He is already in outpatient PT  He takes gabapentin long term but does not know why.     Pulmonary   OSA  Home CPAP nightly.    Cardiovascular   NSTEMI - Multivessel CAD s/p multiple PCI-Wide Complex Rhythm   S/p STEMI in 2018 with VT arrest leading to ICD placement and DES to LAD.  NSTEMI in 02/2018 with DES x2 to mid RCA and RPLA/RPDA bifurcation.  NSTEMI in 05/2018 with PCI to proximal/mid LAD.  NSTEMI in 09/2018 with PCI to distal RCA.  On 07/2019 had DES x2 to the LAD and left circumflex as well as balloon angioplasty of diagonal 2.  S/p LHC at [facility] on 01/25/2020 noting severe multivessel disease without intervention given high risk.  Transferred to [facility] for high risk PCI.   10/29 he is now s/p PCI to the LMCA/LAD with 3.0 x 16 mm Synergy DES   --Continue aspirin, Brilinta, Imdur  --Restart heparin 4-6 hours after TR balloon removal  --Repeat TTE  --Follow up repeat troponin       Paroxysmal a-fib  Longstanding history on amiodarone and apixaban at home.  Was in A. fib with RVR on presentation to OSH and started on amiodarone infusion, now converted back to  oral amio and in NSR.     --Restart Eliquis tomorrow (10/30), bridging now with heparin  --continue PO amiodarone     HFrEF  Most recent echo in 08/2019 with LVEF 35% and grade 3 diastolic dysfunction.  During left heart cath at [facility] reportedly had LVEF of approximately 20%.  Appears well compensated at this time.   -consider changing Coreg to metoprolol given concern for hypotension during dialysis (he was not hypotensive in HD 10/28 and he got all his BB doses)  - consider adding ACE-I/ARB  - consider adding Empagliflozin  - consider restarting diuresis later today if taking PO  ??  Hypertension  Significantly hypertensive on arrival and ongoing  - Toprol XL 50mg  daily  - Consider adding ACE-I/ARB  ??  Hypercholesterolemia  Continue Atorvastatin and Ezetimibe.      Renal   ESRD on iHD Outpatient dialysis on [name], via Right chest catheter.   --Nephrology following  --Dialysis on 10/27 and 10/28, anticipate HD again 10/30  - continue Sevelamer    FEN/GI   Continue home PPI     Heme/Coag   Normocytic anemia  At baseline currently; consistent with anemia of chronic renal disease.  - recheck H/H post-PCI this afternoon    Endocrine   T2DM  Continue home insulin. Also on weekly Semaglutide injection.    Prophylaxis   ? VTE: Heparin gtt   ? GI: Not indicated      Code Status: Full code     Dispo: floor       Objective   ??  Vitals - past 24 hours  Temp:  [36.8 ??C-36.9 ??C] 36.8 ??C  Heart Rate:  [70-79] 70  Resp:  [9-24] 20  BP: (156-189)/(68-83) 186/70  SpO2:  [90 %-100 %] 90 % Intake/Output  No intake/output data recorded.   ??      Wt Readings from Last 3 Encounters:   01/25/20 (!) 165 kg (363 lb 12.1 oz)   12/15/19 (!) 161 kg (355 lb)   11/21/19 (!) 161.6 kg (356 lb 4.2 oz)   ??  ??  Physical Exam:    Gen: No acute distress, resting comfortably in bed  Eyes: Sclerae anicteric, pupils equal round and reactive, extraocular movements intact  HENT: Mucous membranes moist, oropharynx clear  Neck: Supple, no masses or adenopathy appreciated  CV: Normal rate, regular rhythm, no appreciable murmur/rub/gallop, distal pulses intact  Pulm: Normal effort, clear to auscultation throughout, no crackles/wheezes/rhonchi  Abd: Morbidly obese, soft, nontender, nondistended, normoactive bowel sounds  Ext: Warm, well perfused, trace ankle edema  Skin: No obvious rashes or lesions  Neuro: No focal deficits  Psych: Appropriate mood and affect  ??  ??  Continuous Infusions:   ??? amioDARONE 0.5 mg/min (01/26/20 0200)   ??? heparin 12 Units/kg/hr (01/26/20 0200)   ??  ??  Scheduled Medications:   ??? allopurinoL  200 mg Oral Daily   ??? amLODIPine  10 mg Oral Daily   ??? atorvastatin  80 mg Oral Daily   ??? carvediloL  3.125 mg Oral BID   ??? ezetimibe  10 mg Oral Nightly   ??? FLUoxetine  80 mg Oral Daily   ??? gabapentin  300 mg Oral BID   ??? insulin glargine  37 Units Subcutaneous Nightly   ??? insulin lispro  0-12 Units Subcutaneous ACHS   ??? isosorbide mononitrate  30 mg Oral Daily   ??? magnesium sulfate  2 g Intravenous Once   ??? pantoprazole  40 mg Oral BID   ???  sevelamer  1,600 mg Oral 3xd Meals   ??  ??  PRN medications:  acetaminophen, dextrose 50 % in water (D50W), heparin (porcine), melatonin, nitroglycerin  ??  Data/Imaging Review: Reviewed in Epic and personally interpreted on 01/26/2020. See EMR for detailed results.

## 2020-01-27 NOTE — Unmapped (Signed)
RN consult received. Patient identified on admission via RN screening for weight gain. Patient PMH, medical course, labs, meds reviewed. Patient eating 75-100% of meals. Patient is not at nutrition risk, weight gain is not a risk factor for malnutrition. Therefore, will sign off at this time.      Please re-consult should nutrition risk be identified to warrant intervention.     Lavella Lemons, MS, RD, LDN  Pager: [phone]

## 2020-01-28 LAB — CBC
HEMATOCRIT: 29.7 % — ABNORMAL LOW (ref 41.0–53.0)
HEMOGLOBIN: 9.9 g/dL — ABNORMAL LOW (ref 13.5–17.5)
MEAN CORPUSCULAR HEMOGLOBIN CONC: 33.5 g/dL (ref 31.0–37.0)
MEAN CORPUSCULAR HEMOGLOBIN: 28.8 pg (ref 26.0–34.0)
MEAN CORPUSCULAR VOLUME: 86.1 fL (ref 80.0–100.0)
MEAN PLATELET VOLUME: 9.2 fL (ref 7.0–10.0)
PLATELET COUNT: 244 10*9/L (ref 150–440)
RED BLOOD CELL COUNT: 3.44 10*12/L — ABNORMAL LOW (ref 4.50–5.90)
RED CELL DISTRIBUTION WIDTH: 16.1 % — ABNORMAL HIGH (ref 12.0–15.0)
WBC ADJUSTED: 10.1 10*9/L (ref 4.5–11.0)

## 2020-01-28 LAB — BASIC METABOLIC PANEL
ANION GAP: 7 mmol/L (ref 5–14)
BLOOD UREA NITROGEN: 28 mg/dL — ABNORMAL HIGH (ref 9–23)
BUN / CREAT RATIO: 5
CALCIUM: 8.4 mg/dL — ABNORMAL LOW (ref 8.7–10.4)
CHLORIDE: 100 mmol/L (ref 98–107)
CO2: 27 mmol/L (ref 20.0–31.0)
CREATININE: 5.86 mg/dL — ABNORMAL HIGH
EGFR CKD-EPI AA MALE: 12 mL/min/{1.73_m2} — ABNORMAL LOW (ref >=60)
EGFR CKD-EPI NON-AA MALE: 10 mL/min/{1.73_m2} — ABNORMAL LOW (ref >=60)
GLUCOSE RANDOM: 117 mg/dL (ref 70–179)
POTASSIUM: 4.3 mmol/L (ref 3.4–4.5)
SODIUM: 134 mmol/L — ABNORMAL LOW (ref 135–145)

## 2020-01-28 LAB — PHOSPHORUS: PHOSPHORUS: 4 mg/dL (ref 2.4–5.1)

## 2020-01-28 LAB — MAGNESIUM: MAGNESIUM: 2.2 mg/dL (ref 1.6–2.6)

## 2020-01-28 MED ORDER — METOPROLOL SUCCINATE ER 100 MG TABLET,EXTENDED RELEASE 24 HR
ORAL_TABLET | Freq: Every day | ORAL | 1 refills | 30.00000 days | Status: CP
Start: 2020-01-28 — End: 2020-03-28
  Filled 2020-02-16: qty 30, 30d supply, fill #0

## 2020-01-28 MED ADMIN — metoprolol succinate (TOPROL-XL) 24 hr tablet 100 mg: 100 mg | ORAL | @ 13:00:00 | Stop: 2020-01-28

## 2020-01-28 MED ADMIN — ticagrelor (BRILINTA) tablet 90 mg: 90 mg | ORAL | @ 13:00:00 | Stop: 2020-01-28

## 2020-01-28 MED ADMIN — apixaban (ELIQUIS) tablet 5 mg: 5 mg | ORAL | @ 03:00:00

## 2020-01-28 MED ADMIN — ticagrelor (BRILINTA) tablet 90 mg: 90 mg | ORAL | @ 01:00:00

## 2020-01-28 MED ADMIN — gabapentin (NEURONTIN) capsule 300 mg: 300 mg | ORAL | @ 13:00:00 | Stop: 2020-01-28

## 2020-01-28 MED ADMIN — allopurinoL (ZYLOPRIM) tablet 200 mg: 200 mg | ORAL | @ 13:00:00 | Stop: 2020-01-28

## 2020-01-28 MED ADMIN — insulin lispro (HumaLOG) injection 0-12 Units: 0-12 [IU] | SUBCUTANEOUS | @ 01:00:00

## 2020-01-28 MED ADMIN — gabapentin (NEURONTIN) capsule 300 mg: 300 mg | ORAL | @ 01:00:00

## 2020-01-28 MED ADMIN — FLUoxetine (PROzac) capsule 80 mg: 80 mg | ORAL | @ 13:00:00 | Stop: 2020-01-28

## 2020-01-28 MED ADMIN — gentamicin-sodium citrate lock solution in NS: 2.3 mL | @ 21:00:00 | Stop: 2020-01-28

## 2020-01-28 MED ADMIN — pantoprazole (PROTONIX) EC tablet 40 mg: 40 mg | ORAL | @ 01:00:00

## 2020-01-28 MED ADMIN — pantoprazole (PROTONIX) EC tablet 40 mg: 40 mg | ORAL | @ 13:00:00 | Stop: 2020-01-28

## 2020-01-28 MED ADMIN — amiodarone (PACERONE) tablet 200 mg: 200 mg | ORAL | @ 13:00:00 | Stop: 2020-01-28

## 2020-01-28 MED ADMIN — atorvastatin (LIPITOR) tablet 80 mg: 80 mg | ORAL | @ 13:00:00 | Stop: 2020-01-28

## 2020-01-28 MED ADMIN — insulin glargine (LANTUS) injection 37 Units: 37 [IU] | SUBCUTANEOUS | @ 01:00:00

## 2020-01-28 MED ADMIN — ezetimibe (ZETIA) tablet 10 mg: 10 mg | ORAL | @ 01:00:00

## 2020-01-28 MED ADMIN — sevelamer (RENVELA) tablet 1,600 mg: 1600 mg | ORAL | @ 13:00:00 | Stop: 2020-01-28

## 2020-01-28 MED ADMIN — apixaban (ELIQUIS) tablet 5 mg: 5 mg | ORAL | @ 13:00:00 | Stop: 2020-01-28

## 2020-01-28 MED ADMIN — gentamicin-sodium citrate lock solution in NS: 2.2 mL | @ 21:00:00 | Stop: 2020-01-28

## 2020-01-28 NOTE — Unmapped (Addendum)
Jeremy Oliver is a 49 year old male with a past medical history significant for ESRD on dialysis (TTSa), paroxysmal A. fib on apixaban, OSA on CPAP, nonepileptic seizures who initially presented to [facility] [place] with an STEMI but later transferred to [facility] for high risk PCI given severe multivessel disease noted on LHC at OSH.     NSTEMI - Multivessel CAD s/p multiple PCI-Wide Complex Rhythm   S/p STEMI in 2018 with VT arrest leading to ICD placement and DES to LAD.  NSTEMI in 02/2018 with DES x2 to mid RCA and RPLA/RPDA bifurcation.  NSTEMI in 05/2018 with PCI to proximal/mid LAD.  NSTEMI in 09/2018 with PCI to distal RCA.  On 07/2019 had DES x2 to the LAD and left circumflex as well as balloon angioplasty of diagonal 2.  S/p LHC at [facility] on 01/25/2020 noting severe multivessel disease without intervention given high risk. Transferred to [facility] for consideration of high risk PCI.  On 10/29, patient underwent PCI to the LMCA/LAD with 3.0 x 16 mm Synergy DES.  Continue his aspirin, Brilinta, and Imdur.  Will need cardiac rehab on discharge.    Paroxysmal a-fib  Longstanding history on amiodarone and apixaban at home.  Was in A. fib with RVR on presentation to OSH and started on amiodarone infusion, then converted back to oral amnio shortly after his admission at [facility].  Was on a heparin drip prior to his PCI then restarted on his home Eliquis on 10/30.    HFrEF  Most recent echo in 08/2019 with LVEF 35% and grade 3 diastolic dysfunction. ??During left heart cath at [facility] reportedly had LVEF of approximately 20%. ??Appears well compensated at this time. Echo from 10/28 showing LVEF 35-40%.   Resumed his home medications at discharge.  Will likely need to start GDMT outpatient, consider empagliflozin and losartan.    Hypertension  Significantly hypertensive on arrival and ongoing. Restarted his home metoprolol and will likely need losartan per above.    ESRD on iHD   Outpatient dialysis on [name], via Right chest catheter.  Received dialysis on 10/27, 10/28, and 10/30.  Continue sevelamer.  Patient's carvedilol discontinued given concern for hypotension during dialysis.    Hypercholesterolemia  Continue Atorvastatin and Ezetimibe.    Normocytic anemia  At baseline currently; consistent with anemia of chronic renal disease.    T2DM  Continue home insulin. Also on weekly Semaglutide injection.

## 2020-01-28 NOTE — Unmapped (Signed)
[facility] Hemodialysis Procedure Note     01/28/2020    Jeremy Oliver was seen and examined on hemodialysis    CHIEF COMPLAINT: End Stage Renal Disease    INTERVAL HISTORY: Doing well this AM. No complaints. Denies CP/SOB. S/p LHC yesterday with stent placement.    DIALYSIS TREATMENT DATA:  Estimated Dry Weight (kg): 160 kg (352 lb 11.8 oz)  Patient Goal Weight (kg): 3 kg (6 lb 9.8 oz)  Dialyzer: F-180 (98 mLs)  Dialysis Bath  Bath: 2 K+ / 2.5 Ca+  Dialysate Na (mEq/L): 137 mEq/L  Dialysate HCO3 (mEq/L): 35 mEq/L  Blood Flow Rate (mL/min): 340 mL/min  Dialysis Flow (mL/min): 800 mL/min    PHYSICAL EXAM:  Vitals:  Temp:  [36.4 ??C (97.5 ??F)-36.9 ??C (98.4 ??F)] 36.9 ??C (98.4 ??F)  Heart Rate:  [70-74] 70  SpO2 Pulse:  [67-75] 69  BP: (124-191)/(52-118) 161/87  MAP (mmHg):  [78-125] 118  Weights:  Pre-Treatment Weight (kg): 168.5 kg (371 lb 7.6 oz)    General: in no acute distress, currently dialyzing in a chair  Pulmonary: normal respiratory effort and clear to auscultation  Cardiovascular: regular rate and rhythm  Extremities: 1+  edema  Access: Right IJ tunneled catheter     LAB DATA:  Lab Results   Component Value Date    NA 134 (L) 01/28/2020    K 4.3 01/28/2020    CL 100 01/28/2020    CO2 27.0 01/28/2020    BUN 28 (H) 01/28/2020    CREATININE 5.86 (H) 01/28/2020    CALCIUM 8.4 (L) 01/28/2020    MG 2.2 01/28/2020    PHOS 4.0 01/28/2020    ALBUMIN 2.5 (L) 01/25/2020      Lab Results   Component Value Date    HCT 29.7 (L) 01/28/2020    WBC 10.1 01/28/2020        ASSESSMENT/PLAN:  End Stage Renal Disease on Intermittent Hemodialysis:  UF goal: 3.0L as tolerated. 8.5 kg over TW, but we have to do shortened 3hr sessions today due to staffing issues  Adjust medications for a GFR <10  Avoid nephrotoxic agents     Bone Mineral Metabolism:  Lab Results   Component Value Date    CALCIUM 8.4 (L) 01/28/2020    CALCIUM 8.2 (L) 01/27/2020    Lab Results   Component Value Date    ALBUMIN 2.5 (L) 01/25/2020    ALBUMIN 3.1 (L) 11/09/2019      Lab Results   Component Value Date    PHOS 4.0 01/28/2020    PHOS 4.3 08/20/2019    Lab Results   Component Value Date    PTH 403.4 (H) 07/25/2019    PTH 395.4 (H) 04/14/2019      Labs appropriate, no changes.  Continue phosphorus binder and dietary counseling. sevelamer 1600mg  tidac    Anemia:   Lab Results   Component Value Date    HGB 9.9 (L) 01/28/2020    HGB 10.0 (L) 01/27/2020    HGB 9.8 (L) 01/27/2020    Iron Saturation (%)   Date Value Ref Range Status   07/25/2019 11 (L) 20 - 50 % Final      Lab Results   Component Value Date    FERRITIN 150.0 07/25/2019       Anemia labs appropriate, no changes.    Wynelle Fanny, MD  [facility] Division of Nephrology & Hypertension

## 2020-01-28 NOTE — Unmapped (Cosign Needed)
CICU Progress Note   Hospital Day: 4     Hospital Course:  01/23/20: Presented to [facility] with symptoms of chest pain similar to prior MI. Troponin was rising (500 --> 5000). Diagnosed with NSTEMI.   01/25/20 LHC at [facility]: diffuse 80% left main stenosis, 90% ostial LAD, severely dilated cardiomyopathy with EF around 20%. Completed HD, transferred to [facility]  01/26/20: admitted to CICU at [facility], tolerated HD   01/27/20: LHC, PCI w/ DES to LMCA/LAD.  01/28/20: Dialysis today, possible discharge     Subjective / Interval History:    Hgb stable overnight. Eliquis restarted overnight. No complaints this morning. Hypertensive to 170s on vitals but otherwise vitals stable. Due for dialysis today, will hold beta blockers prior to dialysis.     Assessment/Plan:        Principal Problem:    NSTEMI (non-ST elevation myocardial infarction) (CMS-[name])  Active Problems:    Coronary artery disease of native artery of native heart with stable angina pectoris (CMS-[name])    Idiopathic chronic gout of left knee    Hypercholesteremia    Diabetes mellitus with coincident hypertension (CMS-[name])    Obesity, Class III, BMI 40-49.9 (morbid obesity) (CMS-[name])    GERD (gastroesophageal reflux disease)    Paroxysmal atrial fibrillation (CMS-[name])    OSA (obstructive sleep apnea)    Recurrent major depressive disorder, in full remission (CMS-[name])    HFrEF (heart failure with reduced ejection fraction) (CMS-[name])    Chronic anticoagulation    ESRD needing dialysis (CMS-[name])  Resolved Problems:    * No resolved hospital problems. *      Mr. Landi is a 49 y.o. 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     Neurological   Hx of seizures  No recent issues, is not able to elaborate further.     Hx of TIA  No deficits this admission    Depression  Continue home Prozac.     Left Foot drop (chronic)  Reports weakness since ECMO in 2018  He is already in outpatient PT  He takes gabapentin long term but does not know why.     Pulmonary   OSA  Home CPAP nightly.    Cardiovascular   NSTEMI - Multivessel CAD s/p multiple PCI-Wide Complex Rhythm   S/p STEMI in 2018 with VT arrest leading to ICD placement and DES to LAD.  NSTEMI in 02/2018 with DES x2 to mid RCA and RPLA/RPDA bifurcation.  NSTEMI in 05/2018 with PCI to proximal/mid LAD.  NSTEMI in 09/2018 with PCI to distal RCA.  On 07/2019 had DES x2 to the LAD and left circumflex as well as balloon angioplasty of diagonal 2.  S/p LHC at [facility] on 01/25/2020 noting severe multivessel disease without intervention given high risk.  10/29 he is now s/p PCI to the LMCA/LAD with 3.0 x 16 mm Synergy DES   --Continue aspirin, Brilinta, Imdur  --Apixaban restarted overnight   --Hold Imdur for dialysis today       Paroxysmal a-fib  Longstanding history on amiodarone and apixaban at home.  Was in A. fib with RVR on presentation to OSH and started on amiodarone infusion, now converted back to oral amio  and in NSR.     --Eliquis restarted overnight  --continue PO amiodarone     HFrEF  Most recent echo in 08/2019 with LVEF 35% and grade 3 diastolic dysfunction.  During left heart cath at [facility] reportedly had LVEF of approximately 20%.  Appears well compensated at this time. Echo from 10/28 showing LVEF 35-40%.   - Metoprolol succinate 50mg  daily--> increase to Metop 100mg  daily his home dose    - consider adding Empagliflozin and ACEi outpatient   - consider restarting diuresis later today if taking PO  ??  Hypertension  Significantly hypertensive on arrival and ongoing  - Toprol XL 50mg  daily  - Consider adding ACE-I/ARB  ??  Hypercholesterolemia  Continue Atorvastatin and Ezetimibe.      Renal   ESRD on iHD   Outpatient dialysis on [name], via Right chest catheter.   --Nephrology following  --Dialysis on 10/27 and 10/28, anticipate HD again 10/30  - continue Sevelamer    FEN/GI   Continue home PPI     Heme/Coag   Normocytic anemia  At baseline currently; consistent with anemia of chronic renal disease. Hgb at 9.9 this morning but overall stable. Will continue to monitor now back on Eliquis.     Endocrine   T2DM  Continue home insulin. Also on weekly Semaglutide injection.    Prophylaxis   ? VTE: Eliquis   ? GI: Not indicated      Code Status: Full code     Dispo: floor status        Objective   ??  Vitals - past 24 hours  Temp:  [36.8 ??C-36.9 ??C] 36.8 ??C  Heart Rate:  [70-79] 70  Resp:  [9-24] 20  BP: (156-189)/(68-83) 186/70  SpO2:  [90 %-100 %] 90 % Intake/Output  No intake/output data recorded.   ??      Wt Readings from Last 3 Encounters:   01/25/20 (!) 165 kg (363 lb 12.1 oz)   12/15/19 (!) 161 kg (355 lb)   11/21/19 (!) 161.6 kg (356 lb 4.2 oz)   ??  ??  Physical Exam:    Gen: No acute distress, resting comfortably in bed  Eyes: Sclerae anicteric, pupils equal round and reactive, extraocular movements intact  HENT: Mucous membranes moist, oropharynx clear  Neck: Supple, no masses or adenopathy appreciated  CV: Normal rate, regular rhythm, no appreciable murmur/rub/gallop, distal pulses intact  Pulm: Normal effort, clear to auscultation throughout, no crackles/wheezes/rhonchi  Abd: Morbidly obese, soft, nontender, nondistended, normoactive bowel sounds  Ext: Warm, well perfused, trace ankle edema  Skin: No obvious rashes or lesions  Neuro: No focal deficits  Psych: Appropriate mood and affect  ??  ??  Continuous Infusions:   ??? amioDARONE 0.5 mg/min (01/26/20 0200)   ??? heparin 12 Units/kg/hr (01/26/20 0200)   ??  ??  Scheduled Medications:   ??? allopurinoL  200 mg Oral Daily   ??? amLODIPine  10 mg Oral Daily   ??? atorvastatin  80 mg Oral Daily   ??? carvediloL  3.125 mg Oral BID   ??? ezetimibe  10 mg Oral Nightly   ??? FLUoxetine  80 mg Oral Daily   ??? gabapentin  300 mg Oral BID   ??? insulin glargine  37 Units Subcutaneous Nightly   ??? insulin lispro  0-12 Units Subcutaneous ACHS   ??? isosorbide mononitrate  30 mg Oral Daily   ??? magnesium sulfate  2 g Intravenous Once   ??? pantoprazole  40 mg Oral  BID   ??? sevelamer  1,600 mg Oral 3xd Meals   ??  ??  PRN medications:  acetaminophen, dextrose 50 % in water (D50W), heparin (porcine), melatonin, nitroglycerin  ??  Data/Imaging Review: Reviewed in Epic and personally interpreted on 01/26/2020. See EMR for detailed results.          Mack Guise, MD   Internal Medicine, PGY-1

## 2020-01-28 NOTE — Unmapped (Signed)
Cardiology Discharge Summary    Identifying Information:  Jeremy Oliver  1970-12-29  [id number]     Admit Date: 01/25/2020  Discharge Date: 01/28/2020   Discharge Service: Cardiology [facility])  Discharge Attending Physician: Neal Dy, MD  Primary Care Physician: Kurtis Bushman, MD     Discharge Diagnoses:  Principal Problem:    NSTEMI (non-ST elevation myocardial infarction) (CMS-[name])  Active Problems:    Coronary artery disease of native artery of native heart with stable angina pectoris (CMS-[name])    Idiopathic chronic gout of left knee    Hypercholesteremia    Diabetes mellitus with coincident hypertension (CMS-[name])    Obesity, Class III, BMI 40-49.9 (morbid obesity) (CMS-[name])    GERD (gastroesophageal reflux disease)    Paroxysmal atrial fibrillation (CMS-[name])    OSA (obstructive sleep apnea)    Recurrent major depressive disorder, in full remission (CMS-[name])    HFrEF (heart failure with reduced ejection fraction) (CMS-[name])    Chronic anticoagulation    ESRD needing dialysis (CMS-[name])        Principal Diagnosis:  Type I (NSTEMI)    Secondary Diagnoses:  Type I (NSTEMI)  HFrEF, Chronic  Paroxysmal Atrial Fibrillation  Hypertension  Hyperlipidemia  Diabetes Mellitus Type 2  OSA  End Stage Renal Disease  Obesity  Type 2 Diabetes Mellitus  Anemia      Hospital Course:  Jeremy Route Vallery is a 49 year old male with a past medical history significant for ESRD on dialysis (TTSa), paroxysmal A. fib on apixaban, OSA on CPAP, nonepileptic seizures who initially presented to [facility] with an STEMI but later transferred to [facility] for high risk PCI given severe multivessel disease noted on LHC at OSH.     NSTEMI - Multivessel CAD s/p multiple PCI-Wide Complex Rhythm   S/p STEMI in 2018 with VT arrest leading to ICD placement and DES to LAD.  NSTEMI in 02/2018 with DES x2 to mid RCA and RPLA/RPDA bifurcation.  NSTEMI in 05/2018 with PCI to proximal/mid LAD.  NSTEMI in 09/2018 with PCI to distal RCA.  On 07/2019 had DES x2 to the LAD and left circumflex as well as balloon angioplasty of diagonal 2.  S/p LHC at [facility] on 01/25/2020 noting severe multivessel disease without intervention given high risk. Transferred to [facility] for consideration of high risk PCI.  On 10/29, patient underwent PCI to the LMCA/LAD with 3.0 x 16 mm Synergy DES.  Continue his aspirin, Brilinta, and Imdur.  Will need cardiac rehab on discharge.    Paroxysmal a-fib  Longstanding history on amiodarone and apixaban at home.  Was in A. fib with RVR on presentation to OSH and started on amiodarone infusion, then converted back to oral amnio shortly after his admission at [facility].  Was on a heparin drip prior to his PCI then restarted on his home Eliquis on 10/30.    HFrEF  Most recent echo in 08/2019 with LVEF 35% and grade 3 diastolic dysfunction. ??During left heart cath at [facility] reportedly had LVEF of approximately 20%. ??Appears well compensated at this time. Echo from 10/28 showing LVEF 35-40%.   Resumed his home medications at discharge.  Will likely need to start GDMT outpatient, consider empagliflozin and losartan.    Hypertension  Significantly hypertensive on arrival and ongoing. Restarted his home metoprolol and will likely need losartan per above.    ESRD on iHD   Outpatient dialysis on [name], via Right chest catheter.  Received dialysis on 10/27, 10/28, and 10/30.  Continue sevelamer.  Patient's  carvedilol discontinued given concern for hypotension during dialysis.    Hypercholesterolemia  Continue Atorvastatin and Ezetimibe.    Normocytic anemia  At baseline currently; consistent with anemia of chronic renal disease.    T2DM  Continue home insulin. Also on weekly Semaglutide injection.           Outpatient Provider Follow Up Issues:  [ ]  Consider starting losartan and SGLT2   [ ]  Cardiac rehab   [ ]  Review of his current hypertensive medications     Procedures:  LHC with Coronary Angiography and PCI    Discharge Day Services:  BP 161/87  - Pulse 70  - Temp 36.9 ??C (Oral)  - Resp 14  - Wt (!) 169.8 kg (374 lb 5.5 oz)  - SpO2 96%  - BMI 48.06 kg/m??   Pt seen on the day of discharge and determined appropriate for discharge.    Condition at Discharge: stable    Discharge Medications:     Your Medication List      ASK your doctor about these medications    acetaminophen 500 MG tablet  Commonly known as: TYLENOL  TAKE 2 TABLETS (1000MG ) BY MOUTH EVERY 8 HOURS AS NEEDED FOR PAIN     allopurinoL 100 MG tablet  Commonly known as: ZYLOPRIM  Take 2 tablets (200 mg total) by mouth daily.     amiodarone 200 MG tablet  Commonly known as: PACERONE  TAKE 1 TABLET BY MOUTH ONCE DAILY     atorvastatin 80 MG tablet  Commonly known as: LIPITOR  Take 1 tablet (80 mg total) by mouth daily.     BLOOD PRESSURE CUFF Misc  Generic drug: miscellaneous medical supply  Measure BP once a day or if any symptoms     blood sugar diagnostic Strp  Use as directed to check blood glucose Four (4) times a day (before meals and nightly).     blood-glucose meter kit  Disp. blood glucose meter kit preferred by patient's insurance. Dx: Diabetes, E11.9     bumetanide 2 MG tablet  Commonly known as: BUMEX  Take 1 tablet (2 mg total) by mouth two (2) times a day. On non dialysis days     cholecalciferol (vitamin D3 25 mcg (1,000 units)) 1,000 unit (25 mcg) tablet  Take 1 tablet (25 mcg total) by mouth daily.     diclofenac sodium 1 % gel  Commonly known as: VOLTAREN  Apply 2 grams topically Three (3) times a day.     ELIQUIS 5 mg Tab  Generic drug: apixaban  Take 1 tablet (5 mg total) by mouth Two (2) times a day.     empty container Misc  Commonly known as: SHARPS-A-GATOR DISPOSAL SYSTEM  Use as directed for sharps disposal     ezetimibe 10 mg tablet  Commonly known as: ZETIA  TAKE 1 TABLET BY MOUTH ONCE DAILY     FLUoxetine 40 MG capsule  Commonly known as: PROzac  Take 2 capsules (80 mg total) by mouth daily.     gabapentin 300 MG capsule  Commonly known as: NEURONTIN  Take 1 capsule (300 mg total) by mouth two (2) times a day.     hydrOXYzine 10 MG tablet  Commonly known as: ATARAX  Take 1 tablet (10 mg total) by mouth nightly as needed for anxiety.     insulin syringe-needle U-100 0.3 mL 31 gauge x 5/16 (8 mm) Syrg  Use to inject insulin twice daily with meals  isosorbide mononitrate 30 MG 24 hr tablet  Commonly known as: IMDUR  Take 1 tablet (30 mg total) by mouth daily. DO NOT TAKE on dialysis days     lancets Misc  Use as directed to check blood glucose Four (4) times a day (before meals and nightly).     lancets Misc  Disp. lancets #200 or amount allowed, Testing QID, Dx: E11.65, Z79.4. (DM type 2 , Insulin Dep)     LEVEMIR FLEXTOUCH U-100 INSULN 100 unit/mL (3 mL) injection pen  Generic drug: insulin detemir U-100  Inject 37 units total under the skin nightly     loperamide 2 mg capsule  Commonly known as: IMODIUM  Take 1 capsule twice a day and another capsule after each episode of diarrhea, up to 8 pills per day.     metoprolol succinate 100 MG 24 hr tablet  Commonly known as: TOPROL XL  Take 1 tablet (100 mg total) by mouth daily.     nitroglycerin 0.4 MG SL tablet  Commonly known as: NITROSTAT  Place 1 tablet (0.4 mg total) under the tongue every five (5) minutes as needed for chest pain.     NovoLOG Flexpen U-100 Insulin 100 unit/mL (3 mL) injection pen  Generic drug: insulin ASPART  Inject 15 Units under the skin Three (3) times a day before meals.     omeprazole 40 MG capsule  Commonly known as: PriLOSEC  Take 1 capsule (40 mg total) by mouth Two (2) times a day (30 minutes before a meal).     ondansetron 4 MG tablet  Commonly known as: ZOFRAN  Take 1 tablet (4 mg total) by mouth every twelve (12) hours as needed.     OZEMPIC 0.25 mg or 0.5 mg(2 mg/1.5 mL) Pnij injection  Generic drug: semaglutide  Inject 0.5 mg under the skin every seven (7) days.     PRALUENT PEN 150 mg/mL subcutaneous injection  Generic drug: alirocumab  Inject the contents of 1 pen (150 mg total) under the skin every fourteen (14) days.     RENVELA 800 mg tablet  Generic drug: sevelamer  TAKE 2 TABLETS (1600MG ) BY MOUTH THREE TIMES DAILY WITH MEALS     ticagrelor 90 mg Tab  Commonly known as: BRILINTA  TAKE 1 TABLET BY MOUTH TWICE DAILY     UNIFINE PENTIPS 31 gauge x 5/16 (8 mm) Ndle  Generic drug: pen needle, diabetic  USE WITH INSULIN AND VICTOZA 5 TIMES DAILY     VENTOLIN HFA 90 mcg/actuation inhaler  Generic drug: albuterol  Inhale 2 puffs into the lungs every 6 (six) hours as needed for wheezing or shortness of breath.            Recent Labs:  Microbiology Results (last day)     ** No results found for the last 24 hours. **        Lab Results   Component Value Date    WBC 10.1 01/28/2020    HGB 9.9 (L) 01/28/2020    HCT 29.7 (L) 01/28/2020    PLT 244 01/28/2020     Lab Results   Component Value Date    NA 134 (L) 01/28/2020    K 4.3 01/28/2020    CL 100 01/28/2020    CO2 27.0 01/28/2020    BUN 28 (H) 01/28/2020    CREATININE 5.86 (H) 01/28/2020    CALCIUM 8.4 (L) 01/28/2020    MG 2.2 01/28/2020    PHOS 4.0 01/28/2020  Lab Results   Component Value Date    ALKPHOS 99 01/25/2020    BILITOT 0.3 01/25/2020    BILIDIR 0.10 08/18/2019    PROT 6.5 01/25/2020    ALBUMIN 2.5 (L) 01/25/2020    ALT 7 (L) 01/25/2020    AST 10 01/26/2020    GGT 32 01/09/2012     Lab Results   Component Value Date    PT 12.3 01/25/2020    INR 1.05 01/25/2020    APTT 103.7 (H) 01/27/2020     Radiology:  ECG 12 Lead    Result Date: 01/28/2020  SINUS RHYTHM WITH 1ST DEGREE AV BLOCK LEFT VENTRICULAR HYPERTROPHY WITH REPOLARIZATION ABNORMALITY PROLONGED QT ABNORMAL ECG WHEN COMPARED WITH ECG OF 27-Jan-2020 16:59, NO SIGNIFICANT CHANGE WAS FOUND    ECG 12 Lead    Result Date: 01/27/2020  ATRIAL-PACED RHYTHM WITH PROLONGED AV CONDUCTION LEFT VENTRICULAR HYPERTROPHY WITH REPOLARIZATION ABNORMALITY ABNORMAL ECG WHEN COMPARED WITH ECG OF 25-Jan-2020 22:51, ELECTRONIC ATRIAL PACEMAKER HAS REPLACED SINUS RHYTHM    ECG 12 Lead    Result Date: 01/27/2020  NORMAL SINUS RHYTHM LEFT VENTRICULAR HYPERTROPHY WITH REPOLARIZATION ABNORMALITY PROLONGED QT ABNORMAL ECG WHEN COMPARED WITH ECG OF 09-Nov-2019 06:42, NO SIGNIFICANT CHANGE WAS FOUND    Echocardiogram Follow Up/Limited Echo With Contrast    Result Date: 01/27/2020  Patient Info Name:     [name] DAVID Reiber Age:     49 years DOB:     [birth year] Gender:     Male MRN:     [id number] Accession #:     [id number] Ht:     188 cm Wt:     165 kg BSA:     3.01 m2 BP:     161 /     85 mmHg Technical Quality:     Poor Exam Date:     01/26/2020 8:46 AM Site Location:     [facility] Exam Location:     [facility] Admit Date:     01/25/2020 Exam Type:     ECHOCARDIOGRAM FOLLOW UP/LIMITED ECHO WITH CONTRAST Study Info Indications      - NSTEMI,  reevaluate systolic/diastolic function and for WMAs Limited two-dimensional and Doppler transthoracic echocardiogram is performed with contrast to opacify the left ventricle and to improve the delineation of the left ventricle endocardial borders. Staff Referring Physician:     Dalia Heading Reading Fellow:     Massie Maroon MD Sonographer:     Eliezer Champagne Ordering Physician:     Deon Pilling, Kermit Balo Account #:     [id number] Contrast/Agitated Saline ------------------------------ Contrast/Ag. Saline:     Optison Amount:     1.50 ml Administered By:     Eliezer Champagne,  RCS Summary   1. Limited study to assess ventricular function.   2. Echo contrast utilized to enhance endocardial border definition.   3. Technically difficult study.   4. The left ventricular systolic function is moderately decreased, LVEF is visually estimated at 35-40%.   5. The left ventricle is mildly dilated in size with normal wall thickness.   6. The right ventricle is normal in size, with reduced systolic function.   7. IVC size and inspiratory change suggest elevated right atrial pressure. (10-20 mmHg). Left Ventricle   The left ventricular systolic function is moderately decreased, LVEF is visually estimated at 35-40%. The left ventricle is mildly dilated in size with normal wall thickness. Right Ventricle   The right ventricle is normal in size, with reduced systolic function. Mitral  Valve   The mitral valve leaflets are normal with normal leaflet mobility. Inferior Vena Cava   IVC size and inspiratory change suggest elevated right atrial pressure. (10-20 mmHg). Mitral Valve ---------------------------------------------------------------------- Name                                 Value        Normal ---------------------------------------------------------------------- MV Diastolic Function ---------------------------------------------------------------------- MV E Peak Velocity                 69 cm/s               MV A Peak Velocity                 57 cm/s               MV E/A                                 1.2               MV Annular TDI ---------------------------------------------------------------------- MV Septal e' Velocity             5.0 cm/s         >=8.0 MV Lateral e' Velocity           10.8 cm/s        >=10.0 MV e' Average                          7.9               MV E/e' (Average)                     10.0 Tricuspid Valve ---------------------------------------------------------------------- Name                                 Value        Normal ---------------------------------------------------------------------- Estimated PAP/RSVP ---------------------------------------------------------------------- RA Pressure                        15 mmHg           <=5 Venous ---------------------------------------------------------------------- Name                                 Value        Normal ---------------------------------------------------------------------- IVC/SVC ---------------------------------------------------------------------- IVC Diameter (Insp 2D)              1.6 cm               IVC Diameter (Exp 2D)               2.4 cm         <=2.1 IVC Diameter Percent Change (2D) 34 %          >=50 Ventricles ---------------------------------------------------------------------- Name                                 Value        Normal ---------------------------------------------------------------------- LV Dimensions 2D/MM ---------------------------------------------------------------------- IVS Diastolic Thickness (2D)        0.9 cm  0.6-1.0 LVID Diastole (2D)                  6.1 cm       4.2-5.8 LVPW Diastolic Thickness (2D)                                0.9 cm       0.6-1.0 LVID Systole (2D)                   5.1 cm       2.5-4.0 RV Dimensions 2D/MM ---------------------------------------------------------------------- TAPSE                               1.3 cm         >=1.7 Atria ---------------------------------------------------------------------- Name                                 Value        Normal ---------------------------------------------------------------------- LA Dimensions ---------------------------------------------------------------------- LA Dimension (2D)                   4.2 cm       3.0-4.1 Report Signatures Finalized by Yaakov Guthrie  MD on 01/27/2020 02:56 PM Resident Massie Maroon  MD on 01/26/2020 10:56 AM    CATH VASCULAR OUTSIDE STUDY FOR CONTINUED CARE    Result Date: 01/26/2020  These images were imported for continued care purposes only. They will not be interpreted and no charges will apply.    Cath/Vascular Procedure    Result Date: 01/27/2020  Cardiac Catheterization Laboratory [facility] [place], [place] Tel: [phone] Fax: [phone] CARDIAC CATHETERIZATION REPORT Date of Procedure: 01/27/20 ___________________________________________________________________________ _ Findings: 1. Coronary artery disease including 80% LMCA, 90% ostial LAD stenosis. 2. Successful PCI to the LMCA/LAD with with the placement of a 3.0 x 16 mm Synergy DES with excellent angiographic result and TIMI 3 flow. 3. Kissing balloon inflation completed at the completion of the case in the LAD/LCx. Recommendations: 1. Aggressive secondary prevention. 2. Dual antiplatelet therapy with aspirin and ticagrelor for at least 12 months, ideally longer. 3. Follow up with primary cardiologist. Complications: ?? None ___________________________________________________________________________ _ Referring Physician: Leta Baptist, MD Primary Cardiologist: Eppie Gibson, MD Primary Care Physician: Kurtis Bushman Performing Attending: Hoy Finlay, MD Interventional Fellow: Sena Slate, MD Indication for Procedure Presentation:  (STEMI, NSTEMI, Unstable angina, Stable angina):  NSTEMI Angina class:  (Class I-IV): III Pre-procedure non-invasive study:  (low, intermediate, high risk): NA FFR (if performed): No  Clinical Predictors: Cardiogenic shock prior to procedure: No NYHA functional class :2-3 Procedure Status:  Urgent Dialysis: Yes Cardiac arrrest prior to PCI: No CHSA Frailty Score:  5 Syntax Score:  Not calculated Procedures performed: Coronary Angiography Intravascular Ultrasound (IVUS) Percutaneous Coronary Intervention (PCI) Access Site: Left Radial Artery Arterial Closure: TR Band Contrast Volume: 160 mL Fluoroscopy Time: 21 mins Radiation Dose: 2603 mGy ___________________________________________________________________________ _ History Jeremy Route Pajak??is a 49 y.o.??male??with a past medical history significant for ESRD on dialysis (T/TH/S), CAD??s/p??multiple PCIs,??hx of VT after STEMI in 2018 s/p ICD,??T2DM, HTN, HLD, HFrEF (echo??08/2019??EF 35% w G3DD),??TIA,??paroxysmal a-fib??on??Apixaban, OSA on CPAP, non-epileptic seizures??presenting??with chest pain and troponin elevation consistent with NSTEMI. Cath at [facility] showed 80% LMCA stenosis.  Procedure Left Heart Catheterization (Left Radial) Under Lidocaine 2%  local anesthesia, a 35F Terumo Glidesheath was placed in the left radial artery using modified Seldinger technique; ultrasound was used to guide access. 3 mg of verapamil were given via the sheath intra-arterially.  There was somewhat sluggish flow through the left radial artery sheath, so an angiogram was completed that showed an accessory radial artery.  We were unable to advance using a Rosen wire or a Architectural technologist, so a Minamo wire was used and the guide was advanced with balloon-assisted tracking into the aorta.  A 0.035 J wire (260cm) was then used to guide the advancement of the coronary catheter for engagement. 12000 units of heparin were given after the wire and catheter crossed the aortic arch into the ascending aorta. Selective coronary angiography was then performed in multiple views by hand injections of Omnipaque using a 35F Ikari 3.75 guide catheter to engage the left coronary artery. Percutaneous Coronary Intervention The decision was made to intervene on the LMCA/LAD. PCI to LMCA/LAD ?? Anticoagulation: Heparin ?? Antiplatelet: Aspirin, ticagrelor ?? Guide: 35F Ikari 3.75 ?? Guidewire(s): Minamo (LAD), BMW (LCx) ?? Intracoronary imaging: IVUS  ?? Pre-dilation balloon(s): 2.5 x 12 mm Saphire ?? Stent(s): 3.0 x 16 mm Synergy ?? Post-dilation balloon(s): Kissing inflation: 3.0 x 12 mm [place] trek (LAD), 2.5 x 12 mm [place] trek (LCx) ?? Result: Excellent angiographic result with TIMI 3 flow.  Heparin was used for anticoagulation.  The left coronary artery was engaged using a 35F Ikari 3.75 guide catheter.  The Minamo wire was then advanced to the distal LAD.  The BMW wire was advanced to the distal left circumflex.  The left main coronary artery into the LAD was predilated using a 2.5 x 12 mm sapphire balloon (over the Minamo wire) inflated to 8 ATM for 10 seconds for 2 inflations.  Next IVUS was advanced into the left main and LAD and showed significant disease in the left main coronary artery with a maximal luminal diameter of approximately 4 mm.  The maximal diameter of the LAD was felt to be approximately 3 mm.  This was used to size the LAD stent.  The 3.0 x 16 mm Synergy DES was then advanced to the ostium of the left main coronary artery extending into the LAD.  Once positioning was appropriate the BMW wire was retracted from the circumflex.  The stent was deployed at 16 ATM for 15 seconds.  IVUS was then again advanced through the stent and showed well opposed but underexpanded stent with a maximal diameter of approximately 2.5 mm?? in the left main coronary artery indicating need for post dilation.  The BMW wire was advanced through the stent struts into the left circumflex.  Kissing inflations were then completed using a 3.0 x 12 mm [place] trek balloon (LAD-maximum pressure 16 ATM) and a 2.5 x 12 mm [place] trek (LCx-maximum pressure 12 ATM).  Following this there was excellent angiographic result and TIMI-3 flow so all equipment was removed. Following the procedure, 2 mg of verapamil were given via the sheath intra-arterially. The sheath was removed, and a TR band was applied to achieve hemostasis. The TR band was subsequently removed with staged removal of air as per protocol. The patient tolerated the procedure well without any complications. ___________________________________________________________________________ _ FINDINGS Hemodynamics and Left Heart Catheterization Aortic pressure: 153/86 mm Hg (mean 114 mm Hg) Coronary Angiography Dominance: Right Left Main:  The left main coronary artery (LMCA) is a large-caliber vessel that originates from the left coronary sinus. It bifurcates into the left  anterior descending (LAD) and left circumflex (LCx) arteries.  The left main has an 80% stenosis extending into the LAD. LAD:  The LAD is a large-caliber vessel that gives off 1 diagonal (D) branches before it wraps around the apex. D1 is a large-caliber branching vessel with an ostial 70% stenosis.  The ostium of the LAD has a 90% stenosis (extending from the LMCA).  Previous stents in the proximal and mid LAD remain widely patent. Left Circumflex: The LCx is a large-caliber vessel that gives off 3 obtuse marginal (OM) branches and then continues as a small vessel in the AV groove. OM1 is a very small-caliber vessel. OM2 is a moderate-caliber branching vessel.  OM 3 is a small caliber distal vessel.  There is overall mild disease in the LCx with patent stents in the proximal and mid segments. Right Coronary: Not engaged in current study Complications:  None Blood loss:  Minimal Specimen:  None Device/Implants: Synergy DES Pre-procedure Dx: NSTEMI, LMCA disease Post-procedure Dx: Same I personally spent 78 minutes continuously monitoring the patient face to face during the administration of moderate sedation.  Independent observer RN was present for the duration of the procedure to assist in patient monitoring. Pre and post sedation activities have been reviewed. ___________________________________________________________________________ Sena Slate, MD Interventional Cardiology Fellow As PCI was performed / angina was present, cardiac rehab referral ordered and was discussed with patient ___________________________________________________________________________ _ I was present for the entire duration of the procedure, as detailed above Hoy Finlay, MD       Discharge Instructions:            Appointments which have been scheduled for you    Jan 31, 2020 10:30 AM  (Arrive by 10:00 AM)  REMOTE ICD FOLLOW-UP with [facility] EP REMOTE MONITORING  [facility] EP REMOTE MONITORING [facility] REGION) [address]  [address] [place] [address]  [phone]   This is a remote appointment, you do not need to come into the office.     Feb 09, 2020  9:45 AM  (Arrive by 9:30 AM)  Danelle Earthly RETURN with Myrtis Hopping, ANP  [facility] CARDIOLOGY [place] [facility] REGION) [address]  [place] [address]  [phone]      Feb 16, 2020 11:00 AM  (Arrive by 10:45 AM)  RETURN ANN [facility] PROVIDER with Henreitta Leber  [facility] CARDIOLOGY [place] [facility] REGION) [address]  [place] [address]  [phone]      Feb 16, 2020  1:30 PM  (Arrive by 1:00 PM)  VASCULAR ACCESS with Leona Carry, MD  [facility] TRANSPLANT SURGERY [facility] REGION) [address]  [place] [address]  [phone]      Mar 07, 2020  4:00 PM  (Arrive by 3:45 PM)  RETURN CONTINUITY with Roma Schanz, MD  [facility] INTERNAL MEDICINE [place] [facility] REGION) [address]  [place] [address]  [phone]      Mar 15, 2020  3:45 PM  (Arrive by 3:30 PM)  Danelle Earthly RETURN with Marlaine Hind, MD  [facility] CARDIOLOGY [place] [facility]) [address]  [place] [address]  [phone]      Apr 06, 2020 11:00 AM  (Arrive by 10:45 AM)  RETURN EP with Joretta Bachelor, MD  [facility] CARDIOLOGY [place]  (TRIANGLE  [facility] REGION) [address]  [place] [address]  [phone]             Length of Discharge: I spent greater than 30 mins in the discharge of this patient.

## 2020-01-28 NOTE — Unmapped (Signed)
Patient arrived to dialysis unit via wheelchair with vital signs stable, alert/oriented; proceed with ordered HD 01/28/2020:     Hemodialysis inpatient Every Tue,Thu,Sat     Question Answer Comment   K+ 2 meq/L    Ca++ 2.5 meq/L    Bicarb 35 meq/L    Na+ 137 meq/L    Na+ Modeling none    Dialyzer F180NR    Dialysate Temperature (C) 36.5    BFR-As tolerated to a maximum of: 400 mL/min    DFR 800 mL/min    Duration of treatment 4 Hr    Dry weight (kg) 160 kg ?    Challenge dry weight (kg) no    Fluid removal (L) 3 L 10/30    Tubing Adult = 142 ml    Access Site Dialysis Catheter    Access Site Location Right    Keep SBP >: 100

## 2020-01-28 NOTE — Unmapped (Signed)
Pt discharged to home via personal vehicle. Pt is with all belongings. Vital signs stable. No adverse events noted. Dialyzed before leaving, removed 3L.      Problem: Adult Inpatient Plan of Care  Goal: Plan of Care Review  Outcome: Resolved  Goal: Patient-Specific Goal (Individualized)  Outcome: Resolved  Goal: Absence of Hospital-Acquired Illness or Injury  Outcome: Resolved  Intervention: Identify and Manage Fall Risk  Recent Flowsheet Documentation  Taken 01/28/2020 0800 by Virl Cagey, RN  Safety Interventions:   aspiration precautions   lighting adjusted for tasks/safety   low bed   nonskid shoes/slippers when out of bed  Intervention: Prevent Skin Injury  Recent Flowsheet Documentation  Taken 01/28/2020 0800 by Virl Cagey, RN  Skin Protection:   adhesive use limited   incontinence pads utilized  Intervention: Prevent and Manage VTE (Venous Thromboembolism) Risk  Recent Flowsheet Documentation  Taken 01/28/2020 1200 by Virl Cagey, RN  Activity Management: activity adjusted per tolerance  Taken 01/28/2020 0800 by Virl Cagey, RN  Activity Management: activity adjusted per tolerance  Intervention: Prevent Infection  Recent Flowsheet Documentation  Taken 01/28/2020 0800 by Virl Cagey, RN  Infection Prevention:   cohorting utilized   environmental surveillance performed   hand hygiene promoted   personal protective equipment utilized   equipment surfaces disinfected   single patient room provided   rest/sleep promoted   visitors restricted/screened  Goal: Optimal Comfort and Wellbeing  Outcome: Resolved  Goal: Readiness for Transition of Care  Outcome: Resolved  Goal: Rounds/Family Conference  Outcome: Resolved     Problem: Fall Injury Risk  Goal: Absence of Fall and Fall-Related Injury  Outcome: Resolved  Intervention: Promote Injury-Free Environment  Recent Flowsheet Documentation  Taken 01/28/2020 0800 by Virl Cagey, RN  Safety Interventions:   aspiration precautions   lighting adjusted for tasks/safety   low bed   nonskid shoes/slippers when out of bed     Problem: Diabetes Comorbidity  Goal: Blood Glucose Level Within Targeted Range  Outcome: Resolved     Problem: Hypertension Comorbidity  Goal: Blood Pressure in Desired Range  Outcome: Resolved     Problem: Obstructive Sleep Apnea Risk or Actual Comorbidity Management  Goal: Unobstructed Breathing During Sleep  Outcome: Resolved     Problem: Cardiac Output Decreased (Heart Failure)  Goal: Optimal Cardiac Output  Outcome: Resolved     Problem: Dysrhythmia (Heart Failure)  Goal: Stable Heart Rate and Rhythm  Outcome: Resolved     Problem: Fluid Imbalance (Heart Failure)  Goal: Fluid Balance  Outcome: Resolved     Problem: Functional Ability Impaired (Heart Failure)  Goal: Optimal Functional Ability  Outcome: Resolved  Intervention: Optimize Functional Ability  Recent Flowsheet Documentation  Taken 01/28/2020 1200 by Virl Cagey, RN  Activity Management: activity adjusted per tolerance  Taken 01/28/2020 0800 by Virl Cagey, RN  Activity Management: activity adjusted per tolerance     Problem: Oral Intake Inadequate (Heart Failure)  Goal: Optimal Nutrition Intake  Outcome: Resolved     Problem: Respiratory Compromise (Heart Failure)  Goal: Effective Oxygenation and Ventilation  Outcome: Resolved     Problem: Sleep Disordered Breathing (Heart Failure)  Goal: Effective Breathing Pattern During Sleep  Outcome: Resolved     Problem: Adjustment to Illness (Acute Coronary Syndrome)  Goal: Optimal Adaptation to Illness  Outcome: Resolved     Problem: Dysrhythmia (Acute Coronary Syndrome)  Goal: Normalized Cardiac Rhythm  Outcome: Resolved     Problem: Cardiac-Related Pain (Acute  Coronary Syndrome)  Goal: Absence of Cardiac-Related Pain  Outcome: Resolved     Problem: Hemodynamic Instability (Acute Coronary Syndrome)  Goal: Effective Cardiac Pump Function  Outcome: Resolved     Problem: Tissue Perfusion (Acute Coronary Syndrome)  Goal: Adequate Tissue Perfusion  Outcome: Resolved  Intervention: Optimize Cardiac Tissue Perfusion  Recent Flowsheet Documentation  Taken 01/28/2020 1200 by Virl Cagey, RN  Activity Management: activity adjusted per tolerance  Taken 01/28/2020 0800 by Virl Cagey, RN  Activity Management: activity adjusted per tolerance     Problem: Device-Related Complication Risk (Hemodialysis)  Goal: Safe, Effective Therapy Delivery  Outcome: Resolved     Problem: Hemodynamic Instability (Hemodialysis)  Goal: Effective Tissue Perfusion  Outcome: Resolved     Problem: Infection (Hemodialysis)  Goal: Absence of Infection Signs and Symptoms  Outcome: Resolved     Problem: Skin Injury Risk Increased  Goal: Skin Health and Integrity  Outcome: Resolved  Intervention: Optimize Skin Protection  Recent Flowsheet Documentation  Taken 01/28/2020 1600 by Virl Cagey, RN  Head of Bed [facility]) Positioning: HOB at 30-45 degrees  Taken 01/28/2020 1200 by Virl Cagey, RN  Head of Bed [facility]) Positioning: HOB at 30-45 degrees  Taken 01/28/2020 1000 by Virl Cagey, RN  Head of Bed [facility]) Positioning: HOB at 30-45 degrees  Taken 01/28/2020 0800 by Virl Cagey, RN  Pressure Reduction Techniques:   frequent weight shift encouraged   heels elevated off bed  Head of Bed (HOB) Positioning: HOB at 30-45 degrees  Pressure Reduction Devices: pressure-redistributing mattress utilized  Skin Protection:   adhesive use limited   incontinence pads utilized     Problem: Impaired Wound Healing  Goal: Optimal Wound Healing  Outcome: Resolved  Intervention: Promote Wound Healing  Recent Flowsheet Documentation  Taken 01/28/2020 1200 by Virl Cagey, RN  Activity Management: activity adjusted per tolerance  Taken 01/28/2020 0800 by Virl Cagey, RN  Activity Management: activity adjusted per tolerance     Problem: Self-Care Deficit  Goal: Improved Ability to Complete Activities of Daily Living  Outcome: Resolved

## 2020-01-28 NOTE — Unmapped (Signed)
HEMODIALYSIS NURSE PROCEDURE NOTE       Treatment Number:  2 Room / Station:  9    Procedure Date:  01/28/20 Device Name/Number: Beverely Pace    Total Dialysis Treatment Time:  180 Min.    CONSENT:    Written consent was obtained prior to the procedure and is detailed in the medical record.  Prior to the start of the procedure, a time out was taken and the identity of the patient was confirmed via name, medical record number and date of birth.     WEIGHT:  Hemodialysis Pre-Treatment Weights     Date/Time Pre-Treatment Weight (kg) Estimated Dry Weight (kg) Patient Goal Weight (kg) Total Goal Weight (kg)    01/28/20 1301  168.5 kg (371 lb 7.6 oz)  160 kg (352 lb 11.8 oz)  3 kg (6 lb 9.8 oz)  3.55 kg (7 lb 13.2 oz)         Hemodialysis Post Treatment Weights     Date/Time Post-Treatment Weight (kg) Treatment Weight Change (kg)    01/28/20 1615  165.1 kg (363 lb 15.7 oz)  -3.41 kg        Active Dialysis Orders (168h ago, onward)     Start     Ordered    01/28/20 0700  Hemodialysis inpatient  Every Tue,Thu,Sat     Question Answer Comment   K+ 2 meq/L    Ca++ 2.5 meq/L    Bicarb 35 meq/L    Na+ 137 meq/L    Na+ Modeling none    Dialyzer F180NR    Dialysate Temperature (C) 36.5    BFR-As tolerated to a maximum of: 400 mL/min    DFR 800 mL/min    Duration of treatment 4 Hr    Dry weight (kg) 160 kg ?    Challenge dry weight (kg) no    Fluid removal (L) 3 L 10/30    Tubing Adult = 142 ml    Access Site Dialysis Catheter    Access Site Location Right    Keep SBP >: 100        01/28/20 0557              ASSESSMENT:  General appearance: alert, cooperative and no distress  Neurologic: Grossly normal  Lungs: clear to auscultation bilaterally  Heart: S1S2, A paced  Abdomen: rounded firm, no complaints    Denies shortness of breath or chest pain post HD.    ACCESS SITE:       Hemodialysis Catheter 07/27/19 Right Internal jugular 2.2 mL 2.3 mL (Active)   Site Assessment Clean;Dry;Intact 01/28/20 1613   Proximal Lumen Status / Patency Blood Return - Brisk 01/28/20 1613   Proximal Lumen Intervention Accessed 01/28/20 1613   Medial Lumen Status / Patency Blood Return - Brisk 01/28/20 1613   Medial Lumen Intervention Accessed 01/28/20 1613   Dressing Intervention New dressing 01/28/20 1613   Dressing Status      Clean;Dry;Changed 01/28/20 1613   Verification by X-ray No 01/27/20 0400   Site Condition No complications 01/28/20 1613   Dressing Type CHG gel;Occlusive 01/28/20 1613   Dressing Change Due 02/04/20 01/28/20 1613   Line Necessity Reviewed? Y 01/28/20 1613   Line Necessity Indications Yes - Hemodialysis 01/28/20 1300   Line Necessity Reviewed With Nephrology 01/28/20 1300           Catheter fill volumes:    Arterial: 2.2 mL Venous: 2.3 mL   Catheter filled with gentamycin citrate post procedure.  Patient Lines/Drains/Airways Status    Active Peripheral & Central Intravenous Access     Name:   Placement date:   Placement time:   Site:   Days:    Peripheral IV 01/23/20 Left Antecubital   01/23/20    0000    Antecubital   5    Peripheral IV 01/23/20 Anterior;Distal;Right;Upper Arm   01/23/20    0000    Arm   5               LAB RESULTS:  Lab Results   Component Value Date    NA 134 (L) 01/28/2020    K 4.3 01/28/2020    CL 100 01/28/2020    CO2 27.0 01/28/2020    BUN 28 (H) 01/28/2020    CREATININE 5.86 (H) 01/28/2020    GLU 117 01/28/2020    GLUF 223 (H) 06/09/2014    CALCIUM 8.4 (L) 01/28/2020    CAION 4.57 01/25/2020    PHOS 4.0 01/28/2020    MG 2.2 01/28/2020    PTH 403.4 (H) 07/25/2019    IRON 32 (L) 07/25/2019    LABIRON 11 (L) 07/25/2019    TRANSFERRIN 223.8 07/25/2019    FERRITIN 150.0 07/25/2019    TIBC 282.0 07/25/2019     Lab Results   Component Value Date    WBC 10.1 01/28/2020    HGB 9.9 (L) 01/28/2020    HCT 29.7 (L) 01/28/2020    PLT 244 01/28/2020    PHART 7.31 (L) 04/07/2017    PO2ART 124.0 (H) 04/07/2017    PCO2ART 44.3 04/07/2017    HCO3ART 22 04/07/2017    BEART -3.4 (L) 04/07/2017    O2SATART 98.9 04/07/2017    APTT 103.7 (H) 01/27/2020        VITAL SIGNS:   Temperature     Date/Time Temp Temp src      01/28/20 1615  36.9 ??C (98.4 ??F)  Oral         Hemodynamics     Date/Time Pulse BP MAP (mmHg) Patient Position    01/28/20 1630  74  166/90  ???  Sitting    01/28/20 1615  70  ???  ???  Sitting    01/28/20 1600  70  150/83  ???  Sitting    01/28/20 1530  70  156/81  ???  ???    01/28/20 1500  70  152/83  ???  ???    01/28/20 1430  70  162/86  ???  ???    01/28/20 1400  70  160/89  ???  ???    01/28/20 1330  70  153/87  ???  ???    01/28/20 1315  70  162/85  ???  Sitting    01/28/20 1300  70  161/87  ???  Sitting    01/28/20 1200  ???  ???  ???  Lying        Blood Volume Monitor     Date/Time Blood Volume Change (%) HCT HGB Critline O2 SAT %    01/28/20 1615  -10.3 %  32.4  11  61.3    01/28/20 1600  -10.4 %  32.4  11  61.6    01/28/20 1530  -9.2 %  32  10.9  59.5    01/28/20 1500  -7.4 %  31.4  10.7  59.6    01/28/20 1430  -8 %  31.6  10.7  59.1    01/28/20 1400  -5.5 %  30.7  10.4  60.7        Oxygen Therapy     Date/Time Resp SpO2 O2 Device O2 Flow Rate (L/min)    01/28/20 1630  16  ???  None (Room air)  ???    01/28/20 1615  16  97 %  None (Room air)  ???    01/28/20 1600  16  ???  None (Room air)  ???    01/28/20 1530  16  ???  None (Room air)  ???    01/28/20 1500  16  ???  ???  ???    01/28/20 1430  16  ???  None (Room air)  ???    01/28/20 1400  15  ???  None (Room air)  ???          Pre-Hemodialysis Assessment     Date/Time Therapy Number Dialyzer Hemodialysis Line Type All Machine Alarms Passed    01/28/20 1301  2  F-180 (98 mLs)  Adult (142 m/s)  Yes    Date/Time Air Detector Saline Line Double Clampled Hemo-Safe Applied Dialysis Flow (mL/min)    01/28/20 1301  Engaged  ???  ???  800 mL/min    Date/Time Verify Priming Solution Priming Volume Hemodialysis Independent pH Hemodialysis Machine Conductivity (mS/cm)    01/28/20 1301  0.9% NS  300 mL  ???  13.4 mS/cm    Date/Time Hemodialysis Independent Conductivity (mS/cm) Bicarb Conductivity Residual Bleach Negative Total Chlorine    01/28/20 1301 13.4 mS/cm --  Yes  0        Pre-Hemodialysis Treatment Comments     Date/Time Pre-Hemodialysis Comments    01/28/20 1301  vss, alert/oriented, no complaints        Hemodialysis Treatment     Date/Time Blood Flow Rate (mL/min) Arterial Pressure (mmHg) Venous Pressure (mmHg) Transmembrane Pressure (mmHg)    01/28/20 1615  400 mL/min  -168 mmHg  167 mmHg  33 mmHg    01/28/20 1600  400 mL/min  -166 mmHg  157 mmHg  35 mmHg    01/28/20 1530  400 mL/min  -160 mmHg  165 mmHg  34 mmHg    01/28/20 1500  400 mL/min  -160 mmHg  162 mmHg  35 mmHg    01/28/20 1430  400 mL/min  -159 mmHg  163 mmHg  35 mmHg    01/28/20 1400  400 mL/min  -148 mmHg  159 mmHg  37 mmHg    01/28/20 1330  400 mL/min  -137 mmHg  148 mmHg  37 mmHg    01/28/20 1315  400 mL/min  -63 mmHg  34 mmHg  16 mmHg    Date/Time Ultrafiltration Rate (mL/hr) Ultrafiltrate Removed (mL) Dialysate Flow Rate (mL/min) KECN (Kecn)    01/28/20 1615  1180 mL/hr  3550 mL  800 ml/min  ???    01/28/20 1600  1180 mL/hr  3178 mL  800 ml/min  ???    01/28/20 1530  1180 mL/hr  2580 mL  800 ml/min  ???    01/28/20 1500  1180 mL/hr  2010 mL  800 ml/min  ???    01/28/20 1430  1180 mL/hr  1432 mL  800 ml/min  ???    01/28/20 1400  1180 mL/hr  834 mL  800 ml/min  ???    01/28/20 1330  1180 mL/hr  256 mL  800 ml/min  ???    01/28/20 1315  0 mL/hr  0 mL  800 ml/min  ???  Hemodialysis Treatment Comments     Date/Time Intra-Hemodialysis Comments    01/28/20 1615  vss, alert/oriented, HD completed, uf goal met, blood returned    01/28/20 1600  vss, alert, no complaints    01/28/20 1530  vss, alert, watching tv no complaints    01/28/20 1500  vss, resting eyes closed, alerts upon calling    01/28/20 1430  vss, alert, no complaints    01/28/20 1400  pt eyes closed    01/28/20 1330  vss, alert/oriented, MD Chang rounding    01/28/20 1315  vss, alert/oriented, HD initiated        Post Treatment     Date/Time Rinseback Volume (mL) On Line Clearance: spKt/V Total Liters Processed (L/min) Dialyzer Clearance 01/28/20 1615  300 mL  0.56 spKt/V  68.1 L/min  Moderately streaked        Post Hemodialysis Treatment Comments     Date/Time Post-Hemodialysis Comments    01/28/20 1615  vss, alert/oriented, HD completed, uf goal met, blood returned        Hemodialysis I/O     Date/Time Total Hemodialysis Replacement Volume (mL) Total Ultrafiltrate Output (mL)    01/28/20 1615  ???  3000 mL          [id number] - Medicaitons Given During Treatment  (last 3 hrs)         Garfield Cornea, RN       Medication Name Action Time Action Route Rate Dose User     gentamicin-sodium citrate lock solution in NS 01/28/20 1632 Given hemodialysis port injection  2.2 mL Garfield Cornea, RN     gentamicin-sodium citrate lock solution in NS 01/28/20 1633 Given hemodialysis port injection  2.3 mL Garfield Cornea, RN                  Patient tolerated treatment in a  Dialysis Recliner.

## 2020-01-28 NOTE — Unmapped (Signed)
Problem: Adult Inpatient Plan of Care  Goal: Plan of Care Review  Outcome: Progressing  Goal: Patient-Specific Goal (Individualized)  Outcome: Progressing  Goal: Absence of Hospital-Acquired Illness or Injury  Outcome: Progressing  Intervention: Identify and Manage Fall Risk  Recent Flowsheet Documentation  Taken 01/27/2020 2000 by Felizardo Hoffmann, RN  Safety Interventions:  ??? fall reduction program maintained  ??? commode/urinal/bedpan at bedside  ??? bed alarm  ??? lighting adjusted for tasks/safety  ??? low bed  ??? nonskid shoes/slippers when out of bed  Goal: Optimal Comfort and Wellbeing  Outcome: Progressing  Goal: Readiness for Transition of Care  Outcome: Progressing  Goal: Rounds/Family Conference  Outcome: Progressing     Problem: Diabetes Comorbidity  Goal: Blood Glucose Level Within Targeted Range  Outcome: Progressing     Problem: Fall Injury Risk  Goal: Absence of Fall and Fall-Related Injury  Outcome: Progressing  Intervention: Promote Injury-Free Environment  Recent Flowsheet Documentation  Taken 01/27/2020 2000 by Felizardo Hoffmann, RN  Safety Interventions:  ??? fall reduction program maintained  ??? commode/urinal/bedpan at bedside  ??? bed alarm  ??? lighting adjusted for tasks/safety  ??? low bed  ??? nonskid shoes/slippers when out of bed     Problem: Sleep Disordered Breathing (Heart Failure)  Goal: Effective Breathing Pattern During Sleep  Outcome: Progressing     Problem: Self-Care Deficit  Goal: Improved Ability to Complete Activities of Daily Living  Outcome: Progressing   Alert & oriented. NSR on monitor. No bleeding or hematoma noted from cath site. On room air, used BIPAP at bedtime. For possible dialysis today. BS monitored and insulin administered. No complaints of pain or SOB during the shift. Will continue monitor.

## 2020-01-29 ENCOUNTER — Ambulatory Visit: Admit: 2020-01-29 | Discharge: 2020-01-30 | Payer: MEDICARE

## 2020-01-30 DIAGNOSIS — Z09 Encounter for follow-up examination after completed treatment for conditions other than malignant neoplasm: Principal | ICD-10-CM

## 2020-01-30 NOTE — Unmapped (Signed)
-----   Message from Mack Guise, MD sent at 01/28/2020  1:06 PM EDT -----  Regarding: Cardiology Hospital F/u  Hello!     [place] we possibly schedule Jeremy Oliver for an outpatient follow-up with Dr. Andrey Farmer as soon as possible. He will be discharging from the hospital today (10/30).     Thank you!     Portia

## 2020-01-31 ENCOUNTER — Institutional Professional Consult (permissible substitution): Admit: 2020-01-31 | Discharge: 2020-01-31 | Payer: MEDICARE

## 2020-01-31 DIAGNOSIS — Z4502 Encounter for adjustment and management of automatic implantable cardiac defibrillator: Principal | ICD-10-CM

## 2020-01-31 MED ORDER — METOPROLOL SUCCINATE ER 100 MG TABLET,EXTENDED RELEASE 24 HR
ORAL_TABLET | Freq: Every day | ORAL | 5 refills | 30 days
Start: 2020-01-31 — End: 2020-03-01

## 2020-01-31 MED FILL — PRALUENT PEN 150 MG/ML SUBCUTANEOUS PEN INJECTOR: 84 days supply | Qty: 6 | Fill #2 | Status: AC

## 2020-01-31 MED FILL — PRALUENT PEN 150 MG/ML SUBCUTANEOUS PEN INJECTOR: SUBCUTANEOUS | 84 days supply | Qty: 6 | Fill #2

## 2020-02-01 MED FILL — LEVEMIR FLEXTOUCH U-100 INSULIN 100 UNIT/ML (3 ML) SUBCUTANEOUS PEN: 40 days supply | Qty: 15 | Fill #3

## 2020-02-01 MED FILL — VENTOLIN HFA 90 MCG/ACTUATION AEROSOL INHALER: 25 days supply | Qty: 18 | Fill #1 | Status: AC

## 2020-02-01 MED FILL — ELIQUIS 5 MG TABLET: ORAL | 30 days supply | Qty: 60 | Fill #5

## 2020-02-01 MED FILL — BUMETANIDE 2 MG TABLET: ORAL | 54 days supply | Qty: 108 | Fill #2

## 2020-02-01 MED FILL — BUMETANIDE 2 MG TABLET: 54 days supply | Qty: 108 | Fill #2 | Status: AC

## 2020-02-01 MED FILL — ELIQUIS 5 MG TABLET: 30 days supply | Qty: 60 | Fill #5 | Status: AC

## 2020-02-01 MED FILL — VENTOLIN HFA 90 MCG/ACTUATION AEROSOL INHALER: RESPIRATORY_TRACT | 25 days supply | Qty: 18 | Fill #1

## 2020-02-01 MED FILL — LEVEMIR FLEXTOUCH U-100 INSULIN 100 UNIT/ML (3 ML) SUBCUTANEOUS PEN: 40 days supply | Qty: 15 | Fill #3 | Status: AC

## 2020-02-01 NOTE — Unmapped (Signed)
Cardiac Implantable Electronic Device Remote Monitoring     Visit Date:  01/31/2020    Findings: Normal device function, Tested Lead measurements stable and within normal range, Adequate battery reserve-11 years     Arrhythmias: Multiple AF events; 5 % AF burden; currently on Eliquis. Multiple NS-VT and VT-1 episodes EGM's appear to be runs of AF with RVR.     Plan: Continue Routine Remote Monitoring     Last seen in person:11/09/2019    Manufacturer of Device: [employer]       Type of Device: Oceanographer  See scanned/downloaded PDF report for model numbers, serial numbers, and date(s) of implant.    Presenting Rhythm: AS/VS     ______________________________________________________________________    Heart Failure Monitoring: No Evidence of Fluid Accumulation  Hx of HF.       Please see downloaded PDF file of transmission under Media Tab  for full details of device interrogation to include, when applicable,  battery status/charge time, lead trend data, and programmed parameters.

## 2020-02-01 NOTE — Unmapped (Signed)
All appointments with [facility] EP REMOTE MONITORING occur from home.  You do not come in to the office or hospital for these visits.        Your Device data report from this visit can be found in your Mychart by following the steps below.  Step #1-Select Menu then document center    Step #2-Select my documents    Step #3-Select Device checks    Please contact us with any questions.    Thank you,    [facility] EP REMOTE MONITORING [facility]  Phone. [phone]  Fax. [phone]  Email. [email or web address]

## 2020-02-08 NOTE — Unmapped (Signed)
PCP:  Kurtis Bushman, MD    Patient ID: Jeremy Oliver is a 50 y.o. male.    Assessment and Plan:   1. Coronary artery disease involving native coronary artery of native heart without angina pectoris  2. Chronic heart failure with reduced ejection fraction and diastolic dysfunction (CMS-[name])  3. Paroxysmal atrial fibrillation (CMS-[name])  4. Essential hypertension (RAF-[name])  5. CKD 5    Afib burden has improved this month and he remains on amiodarone- will consider referral to EP to discuss rhythm control options  He is otherwise on excellent medical therapy provided by Dr. Cherly Hensen and Dr. Hulan Fess    PCI last month to [facility] with improvement in chest discomfort. He would like to switch dialysis centers. Will order echocardiogram at 3 month RTC to evaluate heart function prior to consideration of AV fistula creation. Increase Imdur to 60 mg, will also order SL nitroglycerin spray. Blood pressure elevated today, will increase Imdur further if blood pressure remains elevated at home.    Dictated using Animal nutritionist, please excuse typos    Elpidio Anis  MD [facility]  Division of Cardiology   [place] [facility] [place] [facility] [phone]    Subjective:     Jeremy Oliver returns for follow-up.  He reports his chest pressure has significantly improved after his recent PCI. He is still experiencing chest pressure with moderate exertion but notes he feels better since his PCI; specifically experiences chest pain in the setting of blood pressure spiking. Reports frequent hypotensive episodes at dialysis.  Denies any recurrent palpitations, bleeding events, or syncope.      Patient Active Problem List   Diagnosis   ??? 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 Idiopathic chronic gout of left knee   ??? Hypercholesteremia   ??? Diabetes mellitus with coincident hypertension (CMS-[name])   ??? 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 Osteoarthritis   ??? Chronic nonalcoholic liver disease   ??? GERD (gastroesophageal reflux disease)   ??? Mild intermittent asthma without complication   ??? Chronic pain syndrome   ??? Paroxysmal atrial fibrillation (CMS-[name])   ??? OSA (obstructive sleep apnea)   ??? Tobacco use disorder   ??? Status post cardiac pacemaker procedure   ??? Diabetes mellitus with gastroparesis (CMS-[name])   ??? Cardiac pacemaker in situ   ??? Recurrent major depressive disorder, in full remission (CMS-[name])   ??? NSTEMI (non-ST elevation myocardial infarction) (CMS-[name])   ??? Adjustment disorder with mixed anxiety and depressed mood   ??? HFrEF (heart failure with reduced ejection fraction) (CMS-[name])   ??? Hyperkalemia   ??? Acquired left foot drop   ??? Decreased sensation of lower extremity, left   ??? Chronic anticoagulation   ??? Type 2 diabetes mellitus with stage 4 chronic kidney disease, with long-term current use of insulin (CMS-[name])   ??? Situational stress   ??? Cough   ??? QT prolongation   ??? ESRD needing dialysis (CMS-[name])   ??? Palliative care by specialist   ??? Angina pectoris (CMS-[name])   ??? Psychophysiological insomnia     Most Recent Imaging:  LHC  01/27/20  1. Coronary artery disease including 80% LMCA, 90% ostial LAD stenosis.  2. Successful PCI to the LMCA/LAD with with the placement of a 3.0 x 16 mm Synergy DES  with excellent angiographic result and TIMI 3 flow.  3. Kissing balloon inflation completed in the LMCA/ LAD/LCx.    08/19/19  1. There is??significant 2-vessel coronary artery disease. ??There is a 90% proximal LAD stenosis which extends into the proximal-LAD stent and 80% proximal LCx stenosis. The mid-LAD stent has mild ISR. The mid and distal RCA stents are widely patent. The RCA has moderate diffuse distal disease.   2. Elevated??left ventricular filling pressures (LVEDP = 24??mm Hg).  3. Successful PCI to the??LAD and LCx??with the placement of a Synergy DES x2??with excellent angiographic result and TIMI 3 flow.  4. Rescue balloon angioplasty of the DIAG2 vessel that was occluded after dilation of the LAD stent    10/22/18  1.   Significant 90% stenosis of distal RCA, severe proximal LCx stenosis, and diffuse severe distal LAD stenosis  2.   Elevated LVEDP = 22   3.   Successful DES to the distal RCA with the placement of a Synergy 4.0 x 20 with excellent angiographic result and TIMI 3 flow.    05/12/18  5. Coronary artery disease including diffuse moderate to severe lesions in the left coronary system, with particular emphasis on the 90% proximal to mid LAD in-stent restenosis.  6. Successful PCI to the proximal to mid LAD with the placement of a Synergy drug eluting stent with excellent angiographic result and TIMI 3 flow.  7. Subsequent impaired flow at an adjacent moderate diagonal, which was small and had pre-existing ostial disease. Recrossed and treated with balloon angioplasty with restored flow.  8. Recent stent to RPDA/RPLA bifurcation widely patent.  9. Normal left ventricular filling pressures (LVEDP = 8 mm Hg).    03/12/18  1. Coronary artery disease including??severe mid RCA, and distal RCA bifurcation stenosis.  2. Left heart cath deferred  3. Successful??PCI??to the mid RCA and RPLA/RPDA bifurcation??with the placement of a Synergy DES x??with excellent angiographic result and TIMI 3 flow.    Echo   01/26/20    1. Limited study to assess ventricular function.    2. Echo contrast utilized to enhance endocardial border definition.    3. Technically difficult study.    4. The left ventricular systolic function is moderately decreased, LVEF is visually estimated at 35-40%.    5. The left ventricle is mildly dilated in size with normal wall thickness.    6. The right ventricle is normal in size, with reduced systolic function.    7. IVC size and inspiratory change suggest elevated right atrial pressure. (10-20 mmHg).    09/22/19    1. Technically difficult study.    2. The left ventricle is moderately to severely dilated in size with normal wall thickness.    3. The left ventricular systolic function is severely decreased, LVEF is visually estimated at 35%.    4. There is grade III diastolic dysfunction (severely elevated filling pressure).    5. The left atrium is severely dilated in size.    6. The right ventricle is upper normal in size, with reduced systolic function.    10/19/18  ?? Dilated left ventricle - mild  ?? Moderately decreased left ventricular systolic function, ejection fraction 35%  ?? Segmental wall motion abnormality - severe posterolateral hypokinesis  ?? Mitral regurgitation - mild  ?? Dilated left atrium - moderate  ?? Aortic sclerosis  ?? Normal right ventricular systolic function  ?? Technically difficult study due to chest wall/lung interference  ?? Ultrasound enhancing agent utilized to improve endocardial border  definition  ?? Diastolic dysfunction - grade I (normal filling pressures)    PVL Vessel Mapping Hemodialysis 11/21/19  Right: Normal Doppler waveforms detected in the brachial and ulnar arteries. No plaque in the brachial artery. Radial artery occluded and fills distally via a collateral. See chart above for vein diameters.  Left: Normal Doppler waveforms detected in the brachial, radial and ulnar arteries. No plaque in the brachial artery. No plaque in the radial artery. See chart above for vein diameters.    ECG 07/28/19  SINUS RHYTHM WITH 1ST DEGREE AV BLOCK  T WAVE ABNORMALITY, CONSIDER LATERAL ISCHEMIA    Current Outpatient Medications   Medication Sig Dispense Refill   ??? acetaminophen (TYLENOL) 500 MG tablet TAKE 2 TABLETS (1000MG ) BY MOUTH EVERY 8 HOURS AS NEEDED FOR PAIN 180 tablet 0   ??? albuterol HFA 90 mcg/actuation inhaler Inhale 2 puffs into the lungs every 6 (six) hours as needed for wheezing or shortness of breath. 18 g 2   ??? alirocumab (PRALUENT) 150 mg/mL subcutaneous injection Inject the contents of 1 pen (150 mg total) under the skin every fourteen (14) days. 6 mL 3   ??? allopurinoL (ZYLOPRIM) 100 MG tablet Take 2 tablets (200 mg total) by mouth daily. 180 tablet 0   ??? amiodarone (PACERONE) 200 MG tablet TAKE 1 TABLET BY MOUTH ONCE DAILY 90 tablet 3   ??? apixaban (ELIQUIS) 5 mg Tab Take 1 tablet (5 mg total) by mouth Two (2) times a day. 60 tablet 11   ??? atorvastatin (LIPITOR) 80 MG tablet Take 1 tablet (80 mg total) by mouth daily. 90 tablet 3   ??? blood sugar diagnostic Strp Use as directed to check blood glucose Four (4) times a day (before meals and nightly). 300 each 3   ??? blood-glucose meter kit Disp. blood glucose meter kit preferred by patient's insurance. Dx: Diabetes, E11.9 1 each 11   ??? bumetanide (BUMEX) 2 MG tablet Take 1 tablet (2 mg total) by mouth two (2) times a day. On non dialysis days 180 tablet 3   ??? cholecalciferol, vitamin D3 25 mcg, 1,000 units,, 1,000 unit (25 mcg) tablet Take 1 tablet (25 mcg total) by mouth daily. 100 tablet 11   ??? diclofenac sodium (VOLTAREN) 1 % gel Apply 2 grams topically Three (3) times a day. 180 g 11   ??? empty container (SHARPS-A-GATOR DISPOSAL SYSTEM) Misc Use as directed for sharps disposal 1 each 2   ??? ezetimibe (ZETIA) 10 mg tablet TAKE 1 TABLET BY MOUTH ONCE DAILY 90 each 3   ??? FLUoxetine (PROZAC) 40 MG capsule Take 2 capsules (80 mg total) by mouth daily. 180 capsule 0   ??? gabapentin (NEURONTIN) 300 MG capsule Take 1 capsule (300 mg total) by mouth two (2) times a day. 180 capsule 3   ??? insulin ASPART (NOVOLOG FLEXPEN U-100 INSULIN) 100 unit/mL (3 mL) injection pen Inject 15 Units under the skin Three (3) times a day before meals. 45 mL 3   ??? insulin detemir U-100 (LEVEMIR FLEXTOUCH U-100 INSULN) 100 unit/mL (3 mL) injection pen Inject 37 units total under the skin nightly 15 mL 3   ??? insulin syringe-needle U-100 0.3 mL 31 gauge x 5/16 Syrg Use to inject insulin twice daily with meals 60 each 0   ??? isosorbide mononitrate (IMDUR) 30 MG 24 hr tablet Take 1 tablet (30 mg total) by mouth daily. DO NOT TAKE on dialysis days 120 tablet 3   ??? lancets Misc Use as directed  to check blood glucose Four (4) times a day (before meals and nightly). 300 each 3   ??? lancets Misc Disp. lancets #200 or amount allowed, Testing QID, Dx: E11.65, Z79.4. (DM type 2 , Insulin Dep) 200 each 11   ??? loperamide (IMODIUM) 2 mg capsule Take 1 capsule twice a day and another capsule after each episode of diarrhea, up to 8 pills per day. 240 capsule 2   ??? metoprolol succinate (TOPROL XL) 100 MG 24 hr tablet Take 1 tablet (100 mg total) by mouth daily. 30 tablet 1   ??? miscellaneous medical supply (BLOOD PRESSURE CUFF) Misc Measure BP once a day or if any symptoms 1 each 0   ??? nitroglycerin (NITROSTAT) 0.4 MG SL tablet Place 1 tablet (0.4 mg total) under the tongue every five (5) minutes as needed for chest pain. 100 each 3   ??? omeprazole (PRILOSEC) 40 MG capsule Take 1 capsule (40 mg total) by mouth Two (2) times a day (30 minutes before a meal). 180 capsule 3   ??? ondansetron (ZOFRAN) 4 MG tablet Take 1 tablet (4 mg total) by mouth every twelve (12) hours as needed. 15 tablet 0   ??? semaglutide (OZEMPIC) 0.25 mg or 0.5 mg(2 mg/1.5 mL) PnIj injection Inject 0.5 mg under the skin every seven (7) days. 4.5 mL 3   ??? sevelamer (RENVELA) 800 mg tablet TAKE 2 TABLETS (1600MG ) BY MOUTH THREE TIMES DAILY WITH MEALS 540 tablet 3   ??? ticagrelor (BRILINTA) 90 mg Tab TAKE 1 TABLET BY MOUTH TWICE DAILY 180 tablet 0   ??? UNIFINE PENTIPS 31 gauge x 5/16 (8 mm) Ndle USE WITH INSULIN AND VICTOZA 5 TIMES DAILY 400 each 3     No current facility-administered medications for this visit.       Allergies   Allergen Reactions   ??? Losartan      Hyperkalemia (K>6)   ??? Morphine Palpitations and Anxiety     Medication error: was administered 20mL instead of 2mL   ??? Opioids - Morphine Analogues Anxiety       Review of Systems  Pertinent items are noted in HPI.      Objective:      Physical Exam:  VITAL SIGNS: BP 196/106 (BP Site: L Arm, BP Position: Sitting, BP Cuff Size: Large)  - Pulse 71  - Ht 188 cm (6' 2)  - Wt (!) 160.5 kg (353 lb 12.8 oz)  - SpO2 98%  - BMI 45.43 kg/m??   GENERAL: NAD  HEENT: Normocephalic and atraumatic.Conjunctivae and sclerae clear and anicteric. No xanthelasma.   NECK: Supple, without masses, thyroid enlargement or adenopathy.  CARDIOVASCULAR: RRR without m/r/g.  RESPIRATORY: Normal respiratory effort without use of accessory muscles. Clear to auscultation bilaterally.  ABDOMEN: Soft, not tender or distended, with audible bowel sounds. No palpable organ enlargement or abnormal masses.  EXTREMITIES:  No pretibial or ankle edema. Ambulatory ability satisfactory.  SKIN: No rashes, ecchymosis or petechiae.  NEUROLOGIC: Appropriate mood and affect. Alert and oriented to person, place, and time. No gross motor or sensory deficits evident.    Wt Readings from Last 6 Encounters:   02/09/20 (!) 160.5 kg (353 lb 12.8 oz)   01/28/20 (!) 169.8 kg (374 lb 5.5 oz)   12/15/19 (!) 161 kg (355 lb)   11/21/19 (!) 161.6 kg (356 lb 4.2 oz)   09/08/19 (!) 165.5 kg (364 lb 12.8 oz)   09/07/19 (!) 163.4 kg (360 lb 3.7 oz)  Please excuse typos, dictation completed via Dragon voice recognition software      _____________________________________________________________  Scribe Attestation:         This document serves as a record of the services and decisions performed by Elpidio Anis, MD on 02/09/2020. It was created on his behalf by Inez Pilgrim, a trained medical scribe. The creation of this document is based on the provider's statements and observations that were conveyed to the medical scribe during the patient's encounter.     (The information in this document, created by the medical scribe for me, accurately reflects the services I personally performed and the decisions made by me. I have reviewed and approved this document for accuracy.)     Elpidio Anis  MD [facility]  Division of Cardiology   [facility] [phone]  Pager [phone]

## 2020-02-09 ENCOUNTER — Ambulatory Visit
Admit: 2020-02-09 | Discharge: 2020-02-10 | Payer: MEDICARE | Attending: Cardiovascular Disease | Primary: Cardiovascular Disease

## 2020-02-09 DIAGNOSIS — I25118 Atherosclerotic heart disease of native coronary artery with other forms of angina pectoris: Principal | ICD-10-CM

## 2020-02-09 DIAGNOSIS — I480 Paroxysmal atrial fibrillation: Principal | ICD-10-CM

## 2020-02-09 DIAGNOSIS — I209 Angina pectoris, unspecified: Principal | ICD-10-CM

## 2020-02-09 DIAGNOSIS — Z992 Dependence on renal dialysis: Principal | ICD-10-CM

## 2020-02-09 DIAGNOSIS — N186 End stage renal disease: Principal | ICD-10-CM

## 2020-02-09 DIAGNOSIS — I5020 Unspecified systolic (congestive) heart failure: Principal | ICD-10-CM

## 2020-02-09 MED ORDER — NITROGLYCERIN 0.4 MG SUBLINGUAL TABLET
ORAL_TABLET | SUBLINGUAL | 6 refills | 1.00000 days | Status: CP | PRN
Start: 2020-02-09 — End: 2020-02-09
  Filled 2020-02-16: qty 12, 10d supply, fill #0

## 2020-02-09 MED ORDER — NITROGLYCERIN 400 MCG/SPRAY TRANSLINGUAL
SUBLINGUAL | 12 refills | 105.00000 days | Status: SS | PRN
Start: 2020-02-09 — End: 2021-02-08

## 2020-02-09 MED ORDER — ISOSORBIDE MONONITRATE ER 60 MG TABLET,EXTENDED RELEASE 24 HR
ORAL_TABLET | Freq: Every day | ORAL | 3 refills | 90.00000 days | Status: SS
Start: 2020-02-09 — End: ?
  Filled 2020-02-16: qty 21, 21d supply, fill #0

## 2020-02-09 MED ORDER — NITROGLYCERIN 400 MCG/SPRAY TRANSLINGUAL: 1 | g | 12 refills | 105 days | Status: AC

## 2020-02-09 NOTE — Unmapped (Signed)
Pt sent message about NTG spray and the pharmacy.  Due to pt's payor source, Ntg sl was ordered, instead of the spray.  New Rx sent.

## 2020-02-09 NOTE — Unmapped (Addendum)
Will increase your Imdur to 60 mg  Will order echocardiogram in 3 months

## 2020-02-10 NOTE — Unmapped (Signed)
Pt needs Ntg spray due to dialysis and is mouth is so dry tablets will not dissolve.  PA will be completed for spray.

## 2020-02-11 NOTE — Unmapped (Signed)
[facility] has sent PA for Nitroglycerin placed in emily's office for review

## 2020-02-11 NOTE — Unmapped (Signed)
I spoke to [employer] pt's Part D insurance.  She has to send it to cliinical review because she is unable to find the questions to ask me.  We should receive an answer in 3-4 days.

## 2020-02-15 NOTE — Unmapped (Deleted)
Referring Provider: Roma Schanz, MD  [address]  [address]  [place],  [place] [address]   Primary Provider: Kurtis Bushman, MD  [address]  [place] [address]   Other Providers: Dr Andrey Farmer, Dr Claudia Pollock EP Follow Up Note    Reason for Visit:  Jeremy Oliver is a 49 y.o. male being seen for urgent visit for atrial fibrillation.    Assessment & Plan:    1. Paroxysmal Atrial Fibrillation  CHA2DS2-VASc - 3 (cad, chf, dm)  eliquis 5mg  bid  Amiodarone 200mg  ***  Switch from carvedilol 25mg  bid to metoprolol succinate 100mg  daily for better rate control. Will likely benefit from increase in metoprolol in the future if he tolerates.    2. HFrEF  s/p Medtronic dual chamber ICD  Pacemaker/ICD checked by device nurse  I have reviewed and agree with the findings  Normal pacemaker function  A pace - 4%  V pace - <1%***  Events - Many Atach, Afib with RVR events over past 24 hours. No therapies given. See previous device RN notes for full description of events of past 24 hours.  Battery - 11 years  Underlying Rhythm - SR  Continue remotes       Follow-up:  No follow-ups on file.      History of Present Illness:  Jeremy Oliver is a 49 y.o. male with extensive past medical history who presents urgently from his PCP after ED evaluation this morning for tachycardia and syncope.      Interval History: ***woke up this morning not feeling well. BP low, 60/30 at home. HR in 90's. BP dropped more, drank pepsi, felt better then BP and HR went sky high. He presented to the ED at [facility] as he continued to feel poorly. While at ED he had brief syncopal spell and his device was interrogated showing multiple brief episodes of AF/AT. By the time of his discharge he was feeling back to normal and continues to feel normal currently.         Cardiovascular History & Procedures:    Cath / PCI:  ?? 01/27/2020 LHC  1. Coronary artery disease including 80% LMCA, 90% ostial LAD stenosis.  2. Successful PCI to the LMCA/LAD with with the placement of a 3.0 x 16 mm Synergy DES with excellent angiographic result and TIMI 3 flow.  3. Kissing balloon inflation completed in the LMCA/ LAD/LCx.      ?? 08/19/19 LHC  1. There is??significant 2-vessel coronary artery disease. ??There is a 90% proximal LAD stenosis which extends into the proximal-LAD stent and 80% proximal LCx stenosis. The mid-LAD stent has mild ISR. The mid and distal RCA stents are widely patent. The RCA has moderate diffuse distal disease.   2. Elevated??left ventricular filling pressures (LVEDP = 24??mm Hg).  3. Successful PCI to the??LAD and LCx??with the placement of a Synergy DES x2??with excellent angiographic result and TIMI 3 flow.  4. Rescue balloon angioplasty of the DIAG2 vessel that was occluded after dilation of the LAD stent    ?? 10/22/2018 LHC  1.   Significant 90% stenosis of distal RCA, severe proximal LCx stenosis, and diffuse severe distal LAD stenosis  2.   Elevated LVEDP = 22   3.   Successful DES to the distal RCA with the placement of a Synergy 4.0 x 20 with excellent angiographic result and TIMI 3 flow.    ?? 05/12/2018 LHC  5.  Coronary artery disease including diffuse moderate to severe lesions in the left coronary system, with particular emphasis on the 90% proximal to mid LAD in-stent restenosis.  6. Successful PCI to the proximal to mid LAD with the placement of a Synergy drug eluting stent with excellent angiographic result and TIMI 3 flow.  7. Subsequent impaired flow at an adjacent moderate diagonal, which was small and had pre-existing ostial disease. Recrossed and treated with balloon angioplasty with restored flow.  8. Recent stent to RPDA/RPLA bifurcation widely patent.  9. Normal left ventricular filling pressures (LVEDP = 8 mm Hg).    ?? 03/12/2018 LHC  1. Coronary artery disease including??severe mid RCA, and distal RCA bifurcation stenosis.  2. Left heart cath deferred  3. Successful??PCI??to the mid RCA and RPLA/RPDA bifurcation??with the placement of a Synergy DES x??with excellent angiographic result and TIMI 3 flow.    ?? 03/17/2017 LHC  ?? Significant 2-vessel??coronary artery disease.90% stenosis of the proximal LAD with TIMI 1 flow into the distal vessel. There is a 70% stenosis of the ostial DIAG2 vessel which was treated with balloon angiogplasty. There is a 70% stenosis of the proximal LCx and there is mild-moderate RCA disease.   ?? Decreased??LV Function by previous echo ??EF estimated 30%. LVEDP 35 mm Hg  ?? PCI??was performed of the??proximal and mid 90%??LAD lesion using Resolute Onyx (drug eluting stent)??x 2. Good??angiographic result with TIMI??3??flow  ?? Impella was placed for cardiogenic shock prior to PCI  ?? Right heart cath after Impella placement- RA mean 11, RV 40/8, PA 41/21 mean 29, ??PCWP mean 12  ?? PA sat 58, Fick CO/CI 4.6/1.7  ?? **    CV Surgery:  ?? **    EP Procedures and Devices:  ?? 05/04/2017 Medtronic dual chamber ICD generator change  ?? **    Non-Invasive Evaluation(s):  Echo:  ?? 01/26/2020 TTE    1. Limited study to assess ventricular function.    2. Echo contrast utilized to enhance endocardial border definition.    3. Technically difficult study.    4. The left ventricular systolic function is moderately decreased, LVEF is  visually estimated at 35-40%.    5. The left ventricle is mildly dilated in size with normal wall thickness.    6. The right ventricle is normal in size, with reduced systolic function.    7. IVC size and inspiratory change suggest elevated right atrial pressure.  (10-20 mmHg).    ?? 09/22/2019 TTE    1. Technically difficult study.    2. LV is moderately to severely dilated in size with normal wall thickness.    3. Left ventricular systolic function severely decreased, LVEF is visually estimated at 35%.    4. Grade III diastolic dysfunction (severely elevated filling pressure).    5. The left atrium is severely dilated in size.    6. Right ventricle is upper normal in size, with reduced systolic function.    ?? **    Cardiac CT/MRI/Nuclear Tests:  ?? **                 Other Past Medical History:  See below for the complete EPIC list of past medical and surgical history.      Allergies:  Losartan, Morphine, and Opioids - morphine analogues    Current Medications:    Current Outpatient Medications on File Prior to Visit   Medication Sig   ??? acetaminophen (TYLENOL) 500 MG tablet TAKE 2 TABLETS (1000MG ) BY MOUTH EVERY 8 HOURS  AS NEEDED FOR PAIN   ??? albuterol HFA 90 mcg/actuation inhaler Inhale 2 puffs into the lungs every 6 (six) hours as needed for wheezing or shortness of breath.   ??? alirocumab (PRALUENT) 150 mg/mL subcutaneous injection Inject the contents of 1 pen (150 mg total) under the skin every fourteen (14) days.   ??? allopurinoL (ZYLOPRIM) 100 MG tablet Take 2 tablets (200 mg total) by mouth daily.   ??? amiodarone (PACERONE) 200 MG tablet TAKE 1 TABLET BY MOUTH ONCE DAILY   ??? apixaban (ELIQUIS) 5 mg Tab Take 1 tablet (5 mg total) by mouth Two (2) times a day.   ??? atorvastatin (LIPITOR) 80 MG tablet Take 1 tablet (80 mg total) by mouth daily.   ??? blood sugar diagnostic Strp Use as directed to check blood glucose Four (4) times a day (before meals and nightly).   ??? blood-glucose meter kit Disp. blood glucose meter kit preferred by patient's insurance. Dx: Diabetes, E11.9   ??? bumetanide (BUMEX) 2 MG tablet Take 1 tablet (2 mg total) by mouth two (2) times a day. On non dialysis days   ??? cholecalciferol, vitamin D3 25 mcg, 1,000 units,, 1,000 unit (25 mcg) tablet Take 1 tablet (25 mcg total) by mouth daily.   ??? diclofenac sodium (VOLTAREN) 1 % gel Apply 2 grams topically Three (3) times a day.   ??? empty container (SHARPS-A-GATOR DISPOSAL SYSTEM) Misc Use as directed for sharps disposal   ??? ezetimibe (ZETIA) 10 mg tablet TAKE 1 TABLET BY MOUTH ONCE DAILY   ??? FLUoxetine (PROZAC) 40 MG capsule Take 2 capsules (80 mg total) by mouth daily.   ??? gabapentin (NEURONTIN) 300 MG capsule Take 1 capsule (300 mg total) by mouth two (2) times a day.   ??? [EXPIRED] hydrOXYzine (ATARAX) 10 MG tablet Take 1 tablet (10 mg total) by mouth nightly as needed for anxiety. (Patient not taking: Reported on 02/09/2020)   ??? insulin ASPART (NOVOLOG FLEXPEN U-100 INSULIN) 100 unit/mL (3 mL) injection pen Inject 15 Units under the skin Three (3) times a day before meals.   ??? insulin detemir U-100 (LEVEMIR FLEXTOUCH U-100 INSULN) 100 unit/mL (3 mL) injection pen Inject 37 units total under the skin nightly   ??? insulin syringe-needle U-100 0.3 mL 31 gauge x 5/16 Syrg Use to inject insulin twice daily with meals   ??? isosorbide mononitrate (IMDUR) 60 MG 24 hr tablet Take 1 tablet (60 mg total) by mouth daily. DO NOT TAKE on dialysis days   ??? lancets Misc Use as directed to check blood glucose Four (4) times a day (before meals and nightly).   ??? lancets Misc Disp. 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 loperamide (IMODIUM) 2 mg capsule Take 1 capsule twice a day and another capsule after each episode of diarrhea, up to 8 pills per day.   ??? metoprolol succinate (TOPROL XL) 100 MG 24 hr tablet Take 1 tablet (100 mg total) by mouth daily.   ??? miscellaneous medical supply (BLOOD PRESSURE CUFF) Misc Measure BP once a day or if any symptoms   ??? nitroglycerin (NITROLINGUAL) 0.4 mg/dose spray Place 1 spray under the tongue every five (5) minutes as needed for chest pain. pT MUST HAVE SPRAY DUE TO DRY MOUTH AND PILLS DO NOT DISSOLVE   ??? omeprazole (PRILOSEC) 40 MG capsule Take 1 capsule (40 mg total) by mouth Two (2) times a day (30 minutes before a  meal).   ??? ondansetron (ZOFRAN) 4 MG tablet Take 1 tablet (4 mg total) by mouth every twelve (12) hours as needed.   ??? semaglutide (OZEMPIC) 0.25 mg or 0.5 mg(2 mg/1.5 mL) PnIj injection Inject 0.5 mg under the skin every seven (7) days.   ??? sevelamer (RENVELA) 800 mg tablet TAKE 2 TABLETS (1600MG ) BY MOUTH THREE TIMES DAILY WITH MEALS   ??? ticagrelor (BRILINTA) 90 mg Tab TAKE 1 TABLET BY MOUTH TWICE DAILY   ??? UNIFINE PENTIPS 31 gauge x 5/16 (8 mm) Ndle USE WITH INSULIN AND VICTOZA 5 TIMES DAILY   ??? [DISCONTINUED] liraglutide (VICTOZA 2-PAK) 0.6 mg/0.1 mL (18 mg/3 mL) injection Inject 0.1 mL (0.6 mg total) under the skin daily.   ??? [DISCONTINUED] ranolazine (RANEXA) 500 MG 12 hr tablet Take by mouth two (2) times a day.   ??? [DISCONTINUED] traZODone (DESYREL) 50 MG tablet Take 1 tablet (50 mg total) by mouth nightly.     No current facility-administered medications on file prior to visit.         Family History:  The patient's family history includes Basal cell carcinoma in his mother; Blindness in his mother; Depression in his maternal uncle; Diabetes in his mother; Gallbladder disease in his half-brother; Heart disease in his father, half-sister, and mother; Hyperlipidemia in his father and mother; Kidney disease in his half-sister; Lung disease in his father; No Known Problems in his daughter and son; Obesity in his daughter, half-brother, and mother.    Social history:  He  reports that he has never smoked. His smokeless tobacco use includes chew. He reports current alcohol use. He reports that he does not use drugs.    Review of Systems:  As per HPI and as follows.  Rest of the review of ten systems is negative or unremarkable except as stated above.    Physical Exam:  VITAL SIGNS:   There were no vitals filed for this visit.   Wt Readings from Last 3 Encounters:   02/09/20 (!) 160.5 kg (353 lb 12.8 oz)   01/28/20 (!) 169.8 kg (374 lb 5.5 oz)   12/15/19 (!) 161 kg (355 lb)      Today's There is no height or weight on file to calculate BMI.      ***  GENERAL: obese, chronically ill-appearing in no acute distress in wheelchair  HEENT: Normocephalic and atraumatic. Conjunctivae and sclerae clear and anicteric.    NECK: Supple.   CARDIOVASCULAR: Rate and rhythm are regular.     RESPIRATORY: Normal respiratory effort..  There are no wheezes.  ABDOMEN: Soft, non-tender, Abdomen nondistended.    EXTREMITIES:  There is no pedal edema, bilaterally.   SKIN: No rashes, ecchymosis or petechiae.  Warm, well perfused. Well healed left sided  Pacemaker/ICD scar   NEURO/PSYCH: Alert and oriented x 3. Affect appropriate.  Nonfocal    Pertinent Laboratory Studies:   Admission on 01/25/2020, Discharged on 01/28/2020   Component Date Value Ref Range Status   ??? hsTroponin I 01/25/2020 2,309* <=53 ng/L Final   ??? PT 01/25/2020 12.3  10.3 - 13.4 sec Final   ??? INR 01/25/2020 1.05   Final   ??? APTT 01/25/2020 47.0* 24.9 - 36.9 sec Final   ??? Heparin Correlation 01/25/2020 0.3   Final   ??? Sodium 01/25/2020 139  135 - 145 mmol/L Final   ??? Potassium 01/25/2020    Final   ??? Chloride 01/25/2020 106  98 - 107 mmol/L Final   ??? Anion  Gap 01/25/2020 8  5 - 14 mmol/L Final   ??? CO2 01/25/2020 25.1  20.0 - 31.0 mmol/L Final   ??? BUN 01/25/2020 20  9 - 23 mg/dL Final   ??? Creatinine 01/25/2020 3.50* 0.70 - 1.30 mg/dL Final   ??? BUN/Creatinine Ratio 01/25/2020 6   Final   ??? EGFR CKD-EPI Non-African American,* 01/25/2020 19* >=60 mL/min/1.74m2 Final   ??? EGFR CKD-EPI African American, Male 01/25/2020 22* >=60 mL/min/1.2m2 Final   ??? Glucose 01/25/2020 166  70 - 179 mg/dL Final   ??? Calcium 16/12/9602 7.8* 8.7 - 10.4 mg/dL Final   ??? Albumin 54/11/8117 2.5* 3.4 - 5.0 g/dL Final   ??? Total Protein 01/25/2020 6.5  5.7 - 8.2 g/dL Final   ??? Total Bilirubin 01/25/2020 0.3  0.3 - 1.2 mg/dL Final   ??? AST 14/78/2956    Final   ??? ALT 01/25/2020 7* 10 - 49 U/L Final   ??? Alkaline Phosphatase 01/25/2020 99  46 - 116 U/L Final   ??? Specimen Source 01/25/2020 Venous   Final   ??? FIO2 Venous 01/25/2020 Not Specified   Final   ??? pH, Venous 01/25/2020 7.37  7.32 - 7.43 Final   ??? pCO2, Ven 01/25/2020 50  40 - 60 mm Hg Final   ??? pO2, Ven 01/25/2020 40  30 - 55 mm Hg Final   ??? HCO3, Ven 01/25/2020 28* 22 - 27 mmol/L Final   ??? Base Excess, Ven 01/25/2020 3.3* -2.0 - 2.0 Final   ??? O2 Saturation, Venous 01/25/2020 71.6  40.0 - 85.0 % Final   ??? Sodium Whole Blood 01/25/2020 140  135 - 145 mmol/L Final   ??? Potassium, Bld 01/25/2020 4.4  3.4 - 4.6 mmol/L Final   ??? Calcium, Ionized Venous 01/25/2020 4.57  4.40 - 5.40 mg/dL Final   ??? Glucose Whole Blood 01/25/2020 185* 70 - 179 mg/dL Final   ??? Lactate, Venous 01/25/2020 1.6  0.5 - 1.8 mmol/L Final   ??? Hgb, blood gas 01/25/2020 11.30* 13.50 - 17.50 g/dL Final   ??? Magnesium 21/30/8657 1.5* 1.6 - 2.6 mg/dL Final   ??? TSH 84/69/6295 8.257* 0.550 - 4.780 uIU/mL Final   ??? WBC 01/25/2020 10.8  4.5 - 11.0 10*9/L Final   ??? RBC 01/25/2020 3.82* 4.50 - 5.90 10*12/L Final   ??? HGB 01/25/2020 10.9* 13.5 - 17.5 g/dL Final   ??? HCT 28/41/3244 33.1* 41.0 - 53.0 % Final   ??? MCV 01/25/2020 86.8  80.0 - 100.0 fL Final   ??? MCH 01/25/2020 28.6  26.0 - 34.0 pg Final   ??? MCHC 01/25/2020 32.9  31.0 - 37.0 g/dL Final   ??? RDW 04/02/7251 16.2* 12.0 - 15.0 % Final   ??? MPV 01/25/2020 9.3  7.0 - 10.0 fL Final   ??? Platelet 01/25/2020 237  150 - 440 10*9/L Final   ??? Neutrophils % 01/25/2020 73.3  % Final   ??? Lymphocytes % 01/25/2020 17.5  % Final   ??? Monocytes % 01/25/2020 5.5  % Final   ??? Eosinophils % 01/25/2020 2.3  % Final   ??? Basophils % 01/25/2020 0.5  % Final   ??? Absolute Neutrophils 01/25/2020 7.9* 2.0 - 7.5 10*9/L Final   ??? Absolute Lymphocytes 01/25/2020 1.9  1.5 - 5.0 10*9/L Final   ??? Absolute Monocytes 01/25/2020 0.6  0.2 - 0.8 10*9/L Final   ??? Absolute Eosinophils 01/25/2020 0.3  0.0 - 0.4 10*9/L Final   ??? Absolute Basophils 01/25/2020 0.1  0.0 -  0.1 10*9/L Final   ??? Large Unstained Cells 01/25/2020 1  0 - 4 % Final   ??? Microcytosis 01/25/2020 Slight* Not Present Final   ??? Anisocytosis 01/25/2020 Slight* Not Present Final   ??? Hypochromasia 01/25/2020 Slight* Not Present Final   ??? APTT 01/26/2020 104.9* 24.9 - 36.9 sec Final   ??? Heparin Correlation 01/26/2020 0.6   Final   ??? APTT 01/26/2020 186.5* 24.9 - 36.9 sec Final   ??? Heparin Correlation 01/26/2020 1.1   Final   ??? AST 01/26/2020 10  <=34 U/L Final   ??? Sodium 01/26/2020 139  135 - 145 mmol/L Final   ??? Potassium 01/26/2020 3.6  3.4 - 4.5 mmol/L Final   ??? Chloride 01/26/2020 104  98 - 107 mmol/L Final   ??? CO2 01/26/2020 26.0  20.0 - 31.0 mmol/L Final   ??? Anion Gap 01/26/2020 9  5 - 14 mmol/L Final   ??? BUN 01/26/2020 23  9 - 23 mg/dL Final   ??? Creatinine 01/26/2020 3.87* 0.70 - 1.30 mg/dL Final   ??? BUN/Creatinine Ratio 01/26/2020 6   Final   ??? EGFR CKD-EPI Non-African American,* 01/26/2020 17* >=60 mL/min/1.15m2 Final   ??? EGFR CKD-EPI African American, Male 01/26/2020 20* >=60 mL/min/1.27m2 Final   ??? Glucose 01/26/2020 252* 70 - 179 mg/dL Final   ??? Calcium 01/25/2535 7.9* 8.7 - 10.4 mg/dL Final   ??? WBC 64/40/3474 10.2  4.5 - 11.0 10*9/L Final   ??? RBC 01/26/2020 3.87* 4.50 - 5.90 10*12/L Final   ??? HGB 01/26/2020 11.0* 13.5 - 17.5 g/dL Final   ??? HCT 25/95/6387 34.6* 41.0 - 53.0 % Final   ??? MCV 01/26/2020 89.3  80.0 - 100.0 fL Final   ??? MCH 01/26/2020 28.4  26.0 - 34.0 pg Final   ??? MCHC 01/26/2020 31.9  31.0 - 37.0 g/dL Final   ??? RDW 56/43/3295 16.0* 12.0 - 15.0 % Final   ??? MPV 01/26/2020 9.5  7.0 - 10.0 fL Final   ??? Platelet 01/26/2020 215  150 - 440 10*9/L Final   ??? hsTroponin I 01/26/2020 2,397* <=53 ng/L Final   ??? Magnesium 01/26/2020 2.6  1.6 - 2.6 mg/dL Final   ??? Potassium 18/84/1660 3.6  3.4 - 4.5 mmol/L Final   ??? APTT 01/26/2020 45.0* 24.9 - 36.9 sec Final   ??? Heparin Correlation 01/26/2020 0.3   Final   ??? hsTroponin I 01/26/2020 2,331* <=53 ng/L Final   ??? Glucose, POC 01/26/2020 162  70 - 179 mg/dL Final   ??? SARS-CoV-2 PCR 01/26/2020 Not Detected  Not Detected Final   ??? Glucose, POC 01/26/2020 201* 70 - 179 mg/dL Final   ??? APTT 63/03/6008 39.8* 24.9 - 36.9 sec Final   ??? Heparin Correlation 01/27/2020 0.2   Final   ??? Sodium 01/27/2020 134* 135 - 145 mmol/L Final   ??? Potassium 01/27/2020 3.7  3.4 - 4.5 mmol/L Final   ??? Chloride 01/27/2020 98  98 - 107 mmol/L Final   ??? CO2 01/27/2020 28.0  20.0 - 31.0 mmol/L Final   ??? Anion Gap 01/27/2020 8  5 - 14 mmol/L Final   ??? BUN 01/27/2020 19  9 - 23 mg/dL Final   ??? Creatinine 01/27/2020 3.69* 0.70 - 1.30 mg/dL Final   ??? BUN/Creatinine Ratio 01/27/2020 5   Final   ??? EGFR CKD-EPI Non-African American,* 01/27/2020 18* >=60 mL/min/1.88m2 Final   ??? EGFR CKD-EPI African American, Male 01/27/2020 21* >=60 mL/min/1.51m2 Final   ??? Glucose 01/27/2020 321*  70 - 99 mg/dL Final   ??? Calcium 16/12/9602 8.2* 8.7 - 10.4 mg/dL Final   ??? WBC 54/11/8117 9.9  4.5 - 11.0 10*9/L Final   ??? RBC 01/27/2020 3.48* 4.50 - 5.90 10*12/L Final   ??? HGB 01/27/2020 9.8* 13.5 - 17.5 g/dL Final   ??? HCT 14/78/2956 30.6* 41.0 - 53.0 % Final   ??? MCV 01/27/2020 87.7  80.0 - 100.0 fL Final   ??? MCH 01/27/2020 28.0  26.0 - 34.0 pg Final   ??? MCHC 01/27/2020 32.0  31.0 - 37.0 g/dL Final   ??? RDW 21/30/8657 16.0* 12.0 - 15.0 % Final   ??? MPV 01/27/2020 9.2  7.0 - 10.0 fL Final   ??? Platelet 01/27/2020 221  150 - 440 10*9/L Final   ??? Magnesium 01/27/2020 1.8  1.6 - 2.6 mg/dL Final   ??? APTT 84/69/6295 103.7* 24.9 - 36.9 sec Final   ??? Heparin Correlation 01/27/2020 0.6   Final   ??? EKG Ventricular Rate 01/25/2020 75  BPM Final   ??? EKG Atrial Rate 01/25/2020 75  BPM Final   ??? EKG P-R Interval 01/25/2020 204  ms Final   ??? EKG QRS Duration 01/25/2020 100  ms Final   ??? EKG Q-T Interval 01/25/2020 444  ms Final   ??? EKG QTC Calculation 01/25/2020 495  ms Final   ??? EKG Calculated P Axis 01/25/2020 26  degrees Final   ??? EKG Calculated R Axis 01/25/2020 -3  degrees Final   ??? EKG Calculated T Axis 01/25/2020 154  degrees Final   ??? QTC Fredericia 01/25/2020 478  ms Final   ??? EKG Ventricular Rate 01/25/2020 70  BPM Final   ??? EKG Atrial Rate 01/25/2020 70  BPM Final   ??? EKG P-R Interval 01/25/2020 250  ms Final   ??? EKG QRS Duration 01/25/2020 94  ms Final   ??? EKG Q-T Interval 01/25/2020 460  ms Final   ??? EKG QTC Calculation 01/25/2020 496  ms Final   ??? EKG Calculated P Axis 01/25/2020 42  degrees Final   ??? EKG Calculated R Axis 01/25/2020 -9  degrees Final   ??? EKG Calculated T Axis 01/25/2020 156  degrees Final   ??? QTC Fredericia 01/25/2020 484  ms Final   ??? HGB 01/27/2020 10.0* 13.5 - 17.5 g/dL Final   ??? HCT 28/41/3244 31.4* 41.0 - 53.0 % Final   ??? Activated Clotting Time 01/27/2020 164  Interpret s Final   ??? Activated Clotting Time 01/27/2020 240  Interpret s Final   ??? Glucose, POC 01/27/2020 138  70 - 179 mg/dL Final   ??? Glucose, POC 01/27/2020 237* 70 - 179 mg/dL Final   ??? Glucose, POC 01/27/2020 216* 70 - 179 mg/dL Final   ??? Sodium 04/02/7251 134* 135 - 145 mmol/L Final   ??? Potassium 01/28/2020 4.3  3.4 - 4.5 mmol/L Final   ??? Chloride 01/28/2020 100  98 - 107 mmol/L Final   ??? CO2 01/28/2020 27.0  20.0 - 31.0 mmol/L Final   ??? Anion Gap 01/28/2020 7  5 - 14 mmol/L Final   ??? BUN 01/28/2020 28* 9 - 23 mg/dL Final   ??? Creatinine 01/28/2020 5.86* 0.70 - 1.30 mg/dL Final   ??? BUN/Creatinine Ratio 01/28/2020 5   Final   ??? EGFR CKD-EPI Non-African American,* 01/28/2020 10* >=60 mL/min/1.63m2 Final   ??? EGFR CKD-EPI African American, Male 01/28/2020 12* >=60 mL/min/1.52m2 Final   ??? Glucose 01/28/2020 117  70 - 179 mg/dL  Final   ??? Calcium 01/28/2020 8.4* 8.7 - 10.4 mg/dL Final   ??? WBC 29/56/2130 10.1  4.5 - 11.0 10*9/L Final   ??? RBC 01/28/2020 3.44* 4.50 - 5.90 10*12/L Final   ??? HGB 01/28/2020 9.9* 13.5 - 17.5 g/dL Final   ??? HCT 86/57/8469 29.7* 41.0 - 53.0 % Final   ??? MCV 01/28/2020 86.1  80.0 - 100.0 fL Final   ??? MCH 01/28/2020 28.8  26.0 - 34.0 pg Final   ??? MCHC 01/28/2020 33.5  31.0 - 37.0 g/dL Final   ??? RDW 62/95/2841 16.1* 12.0 - 15.0 % Final   ??? MPV 01/28/2020 9.2  7.0 - 10.0 fL Final   ??? Platelet 01/28/2020 244  150 - 440 10*9/L Final   ??? Magnesium 01/28/2020 2.2  1.6 - 2.6 mg/dL Final   ??? Glucose, POC 01/28/2020 119  70 - 179 mg/dL Final   ??? EKG Ventricular Rate 01/27/2020 72  BPM Final   ??? EKG Atrial Rate 01/27/2020 72  BPM Final   ??? EKG P-R Interval 01/27/2020 222  ms Final   ??? EKG QRS Duration 01/27/2020 98  ms Final   ??? EKG Q-T Interval 01/27/2020 462  ms Final   ??? EKG QTC Calculation 01/27/2020 505  ms Final   ??? EKG Calculated P Axis 01/27/2020 37  degrees Final   ??? EKG Calculated R Axis 01/27/2020 -7  degrees Final   ??? EKG Calculated T Axis 01/27/2020 152  degrees Final   ??? QTC Fredericia 01/27/2020 491  ms Final   ??? Glucose, POC 01/28/2020 162  70 - 179 mg/dL Final   ??? Phosphorus 01/28/2020 4.0  2.4 - 5.1 mg/dL Final   ??? Glucose, POC 01/28/2020 98  70 - 179 mg/dL Final   Admission on 32/44/0102, Discharged on 11/09/2019   Component Date Value Ref Range Status   ??? EKG Ventricular Rate 11/09/2019 87  BPM Final   ??? EKG Atrial Rate 11/09/2019 87  BPM Final   ??? EKG P-R Interval 11/09/2019 202  ms Final   ??? EKG QRS Duration 11/09/2019 94  ms Final   ??? EKG Q-T Interval 11/09/2019 416  ms Final   ??? EKG QTC Calculation 11/09/2019 500  ms Final   ??? EKG Calculated P Axis 11/09/2019 60  degrees Final   ??? EKG Calculated R Axis 11/09/2019 -12  degrees Final   ??? EKG Calculated T Axis 11/09/2019 137  degrees Final   ??? QTC Fredericia 11/09/2019 470  ms Final   ??? Sodium 11/09/2019 138  135 - 145 mmol/L Final   ??? Potassium 11/09/2019 4.2  3.4 - 4.5 mmol/L Final   ??? Chloride 11/09/2019 106  98 - 107 mmol/L Final   ??? Anion Gap 11/09/2019 10  5 - 14 mmol/L Final   ??? CO2 11/09/2019 22.3  20.0 - 31.0 mmol/L Final   ??? BUN 11/09/2019 25* 9 - 23 mg/dL Final   ??? Creatinine 11/09/2019 4.69* 0.60 - 1.10 mg/dL Final   ??? BUN/Creatinine Ratio 11/09/2019 5   Final   ??? EGFR CKD-EPI Non-African American,* 11/09/2019 13* >=60 mL/min/1.11m2 Final   ??? EGFR CKD-EPI African American, Male 11/09/2019 15* >=60 mL/min/1.59m2 Final   ??? Glucose 11/09/2019 147  70 - 179 mg/dL Final   ??? Calcium 72/53/6644 8.9  8.7 - 10.4 mg/dL Final   ??? Albumin 03/47/4259 3.1* 3.4 - 5.0 g/dL Final   ??? Total Protein 11/09/2019 7.2  5.7 - 8.2 g/dL Final   ???  Total Bilirubin 11/09/2019 0.4  0.3 - 1.2 mg/dL Final   ??? AST 22/05/5425 11  <=34 U/L Final   ??? ALT 11/09/2019 <7* 10 - 49 U/L Final   ??? Alkaline Phosphatase 11/09/2019 116  46 - 116 U/L Final   ??? hsTroponin I 11/09/2019 39  <=53 ng/L Final   ??? delta hsTroponin I 11/09/2019    Final   ??? PT 11/09/2019 12.0  10.5 - 13.5 sec Final   ??? INR 11/09/2019 1.01   Final   ??? APTT 11/09/2019 30.6  25.3 - 37.1 sec Final   ??? Heparin Correlation 11/09/2019 0.2   Final   ??? WBC 11/09/2019 9.4  3.5 - 10.5 10*9/L Final   ??? RBC 11/09/2019 4.57  4.32 - 5.72 10*12/L Final   ??? HGB 11/09/2019 12.5* 13.5 - 17.5 g/dL Final   ??? HCT 09/21/7626 38.8  38.0 - 50.0 % Final   ??? MCV 11/09/2019 84.8  81.0 - 95.0 fL Final   ??? MCH 11/09/2019 27.3  26.0 - 34.0 pg Final   ??? MCHC 11/09/2019 32.1  30.0 - 36.0 g/dL Final   ??? RDW 31/51/7616 17.2* 12.0 - 15.0 % Final   ??? MPV 11/09/2019 8.0  7.0 - 10.0 fL Final   ??? Platelet 11/09/2019 208  150 - 450 10*9/L Final   ??? nRBC 11/09/2019 0  <=4 /100 WBCs Final   ??? Neutrophils % 11/09/2019 72.5  % Final   ??? Lymphocytes % 11/09/2019 18.5  % Final   ??? Monocytes % 11/09/2019 5.9  % Final   ??? Eosinophils % 11/09/2019 2.2  % Final   ??? Basophils % 11/09/2019 0.9  % Final   ??? Absolute Neutrophils 11/09/2019 6.8  1.7 - 7.7 10*9/L Final   ??? Absolute Lymphocytes 11/09/2019 1.7  0.7 - 4.0 10*9/L Final   ??? Absolute Monocytes 11/09/2019 0.6  0.1 - 1.0 10*9/L Final   ??? Absolute Eosinophils 11/09/2019 0.2  0.0 - 0.7 10*9/L Final   ??? Absolute Basophils 11/09/2019 0.1  0.0 - 0.1 10*9/L Final   ??? Anisocytosis 11/09/2019 Slight* Not Present Final   ??? hsTroponin I 11/09/2019 34  <=53 ng/L Final   ??? delta hsTroponin I 11/09/2019 5  <=7 ng/L Final   ??? Specimen Source 11/09/2019 Venous   Final   ??? FIO2 Venous 11/09/2019 Room Air   Final   ??? pH, Venous 11/09/2019 7.35  7.32 - 7.43 Final   ??? pCO2, Ven 11/09/2019 42  40 - 60 mm Hg Final   ??? pO2, Ven 11/09/2019 58* 30 - 55 mm Hg Final   ??? HCO3, Ven 11/09/2019 22  22 - 27 mmol/L Final   ??? Base Excess, Ven 11/09/2019 -2.9* -2.0 - 2.0 Final   ??? O2 Saturation, Venous 11/09/2019 88.7* 40.0 - 85.0 % Final   ??? Sodium Whole Blood 11/09/2019 140  135 - 145 mmol/L Final   ??? Potassium, Bld 11/09/2019 4.4  3.4 - 4.6 mmol/L Final   ??? Calcium, Ionized Venous 11/09/2019 4.55  4.40 - 5.40 mg/dL Final   ??? Glucose Whole Blood 11/09/2019 144  Undefined mg/dL Final   ??? Lactate, Venous 11/09/2019 0.9  0.5 - 1.8 mmol/L Final   ??? Hgb, blood gas 11/09/2019 12.60* 13.50 - 17.50 g/dL Final   ??? BNP 07/37/1062 511.30* <=100 pg/mL Final       Lab Results   Component Value Date    PRO-BNP 8,740.0 (H) 09/14/2019    PRO-BNP 7,270.0 (H) 08/18/2019  PRO-BNP 5,460.0 (H) 07/25/2019    PRO-BNP 231 (H) 06/08/2014    Creatinine, POCT 5.8 (H) 07/25/2019    Creatinine, POCT 6.2 (H) 07/22/2019    Creatinine 5.86 (H) 01/28/2020    Creatinine 3.69 (H) 01/27/2020    BUN 28 (H) 01/28/2020    BUN 19 01/27/2020    BUN, POCT 56 (H) 07/25/2019    BUN, POCT 57 (H) 07/22/2019    Sodium 134 (L) 01/28/2020    Sodium Whole Blood 132 (L) 03/25/2017    Sodium, POCT 134 (L) 07/25/2019    Potassium 4.3 01/28/2020    Potassium, Bld 5.5 (H) 03/25/2017    Potassium, POCT 5.3 (H) 07/25/2019    CO2 27.0 01/28/2020    CO2, POCT 19 (L) 07/25/2019    Magnesium 2.2 01/28/2020    Magnesium 2.0 06/09/2014    Total Bilirubin 0.3 01/25/2020    Total Bilirubin 0.8 06/08/2014    INR, POC 1.2 02/12/2018    INR 1.05 01/25/2020       No results found for: DIGOXIN    Lab Results   Component Value Date    TSH 8.257 (H) 01/25/2020    TSH 2.29 06/08/2014    Cholesterol 213 (H) 04/14/2019    Cholesterol, Total 379 (H) 08/30/2012    Triglycerides 185 (H) 04/14/2019    Triglycerides 400 (H) 08/30/2012    HDL 39 (L) 04/14/2019    HDL 35 (L) 08/30/2012    Non-HDL Cholesterol 174 04/14/2019    LDL Calculated 137 (H) 04/14/2019    LDL Direct 239 08/30/2012    LDL Cholesterol, Calculated 264 08/30/2012       Lab Results   Component Value Date    WBC 10.1 01/28/2020    WBC 7.5 06/09/2014    HGB 9.9 (L) 01/28/2020    Hemoglobin 8.1 (L) 03/25/2017    HCT 29.7 (L) 01/28/2020    HCT 37.9 (L) 06/09/2014    Platelet 244 01/28/2020    Platelet 190 06/09/2014       Pertinent Test Results from Today:  ECG: reviewed earlier EKG showing SR. *** See official ECG report.  The following are further history from the patient's EPIC record for reference:     Past Medical History:   Diagnosis Date   ??? Angina at rest (CMS-[name])    ??? Arthritis    ??? Asthma    ??? Atrial fibrillation and flutter (CMS-[name]) 06/08/14   ??? BMI 40.0-44.9, adult (CMS-[name]) 10/29/2015   ??? Chronic anticoagulation 02/17/2018   ??? Chronic pain syndrome 04/11/2014    ~Use daily lyrica as recommended ~Tramadol as needed for pain ~Fluoxetine daily as recommended ~Daily exercise ~Eat a healthy diet ~Good control of your blood sugars can help    ??? Coronary artery disease    ??? Depression    ??? Diabetes mellitus (CMS-[name])    ??? ESRD (end stage renal disease) on dialysis (CMS-[name])    ??? Eye trauma 1998    glass in both eyes and removed   ??? GERD (gastroesophageal reflux disease)    ??? Gout    ??? Homeless    ??? Hyperlipidemia    ??? Hypertension    ??? Illiteracy and low-level literacy    ??? Microscopic hematuria    ??? Migraine    ??? Nephrolithiasis    ??? 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 Nonalcoholic fatty liver disease    ??? NSTEMI (non-ST elevated myocardial infarction) (CMS-[name]) 01/13/2017   ??? Obesity    ??? Obstructive sleep apnea 2009   ??? Panic attacks    ??? 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 Seizure-like activity (CMS-[name])     ~Monitor for symptoms and report them to your primary care provider if they occur    ??? Systolic CHF, chronic (CMS-[name])     EF 40-45% 2017   ??? TIA (transient ischemic attack)        Past Surgical History:   Procedure Laterality Date   ??? CARDIAC CATHETERIZATION 03/08/2007   ??? CORONARY STENT PLACEMENT     ??? CYSTOURETHROSCOPY  12/31/2009     Microscopic hematuria   ??? KNEE SURGERY Right 09/23/2006    arthroscopic knee surgery medial and lateral meniscus tears as well as crystal disease   ??? PR CATH PLACE/CORON ANGIO, IMG SUPER/INTERP,R&L HRT CATH, L HRT VENTRIC N/A 02/13/2017    Procedure: Left/Right Heart Catheterization;  Surgeon: Marlaine Hind, MD;  Location: [facility] CATH;  Service: Cardiology   ??? PR CATH PLACE/CORON ANGIO, IMG SUPER/INTERP,W LEFT HEART VENTRICULOGRAPHY N/A 03/17/2017    Procedure: Left Heart Catheterization W Intervention;  Surgeon: Marlaine Hind, MD;  Location: [facility] CATH;  Service: Cardiology   ??? PR CATH PLACE/CORON ANGIO, IMG SUPER/INTERP,W LEFT HEART VENTRICULOGRAPHY N/A 05/12/2018    Procedure: CATH LEFT HEART CATHETERIZATION W INTERVENTION;  Surgeon: Dorathy Kinsman, MD;  Location: [facility] CATH;  Service: Cardiology   ??? PR CATH PLACE/CORON ANGIO, IMG SUPER/INTERP,W LEFT HEART VENTRICULOGRAPHY N/A 10/22/2018    Procedure: CATH LEFT HEART CATHETERIZATION W INTERVENTION;  Surgeon: Marlaine Hind, MD;  Location: [facility] CATH;  Service: Cardiology   ??? PR CATH PLACE/CORON ANGIO, IMG SUPER/INTERP,W LEFT HEART VENTRICULOGRAPHY N/A 08/19/2019    Procedure: Left Heart Catheterization W Intervention;  Surgeon: Marlaine Hind, MD;  Location: [facility] CATH;  Service: Cardiology   ??? PR ECMO/ECLS INITIATION VENO-ARTERIAL N/A 03/19/2017    Procedure: ECMO/ECLS; INITIATION, VENO-ARTERIAL;  Surgeon: Lennie Odor, MD;  Location: [facility] OR [facility];  Service: Cardiac Surgery   ??? PR ECMO/ECLS RMVL PRPH CANNULA OPEN 6 YRS & OLDER Left 03/25/2017    Procedure: ECMO / ECLS PROVIDED BY PHYSICIAN; REMOVAL OF PERIPHERAL (ARTERIAL AND/OR VENOUS) CANNULA(E), OPEN, 6 YEARS AND OLDER;  Surgeon: Alonna Buckler Ikonomidis, MD;  Location: MAIN OR [facility];  Service: Cardiac Surgery   ??? PR EPHYS EVAL W/ ABLATION SUPRAVENT ARRHYTHMIA N/A 07/19/2015    Catheter ablation of cavotricuspid isthmus for atrial flutter [facility])   ??? PR INSER HART PACER XVENOUS ATRIAL N/A 05/30/2015    [employer] dual-chamber pacemaker implant (E.Chung)   ??? PR INSERT/PLACE FLOW DIRECT CATH N/A 03/17/2017    Procedure: Insert Leave In [place];  Surgeon: Marlaine Hind, MD;  Location: [facility] CATH;  Service: Cardiology   ??? PR INSJ/RPLCMT PERM DFB W/TRNSVNS LDS 1/DUAL CHMBR N/A 05/04/2017    Procedure: ICD Implant System (Single/Dual);  Surgeon: Eldred Manges, MD;  Location: [facility] CATH;  Service: Cardiology   ??? PR NEGATIVE PRESSURE WOUND THERAPY DME >50 SQ CM Left 03/25/2017    Procedure: Neg Press Wound Tx (Vac Assist) Incl Topicals, Per Session, Tsa Greater  Than/= 50 Cm Squared;  Surgeon: Alonna Buckler Ikonomidis, MD;  Location: MAIN OR [facility];  Service: Cardiac Surgery   ??? PR PRQ TRLUML CORONARY STENT W/ANGIO ONE ART/BRNCH N/A 03/12/2018    Procedure: Percutaneous Coronary Intervention;  Surgeon: Marlaine Hind, MD;  Location: [facility] CATH;  Service: Cardiology   ??? PR PRQ TRLUML CORONARY STENT W/ANGIO ONE ART/BRNCH N/A 01/27/2020    Procedure: Percutaneous Coronary Intervention;  Surgeon: Marlaine Hind, MD;  Location: [facility] CATH;  Service: Cardiology   ??? PR REBL VES DIRECT,LOW EXTREM Left 03/19/2017    Procedure: Repr Bld Vessel Direct; Lower Extrem;  Surgeon: Boykin Reaper, MD;  Location: MAIN OR [facility];  Service: Vascular   ??? PR REMV ART CLOT ILIAC-POP,LEG INCIS Left 03/25/2017    Procedure: EMBOLECTOMY OR THROMBECTOMY, WITH OR WITHOUT CATHETER; FEMOROPOPLITEAL, AORTOILIAC ARTERY, BY LEG INCISION;  Surgeon: Alonna Buckler Ikonomidis, MD;  Location: MAIN OR [facility];  Service: Cardiac Surgery   ??? PR UPPER GI ENDOSCOPY,BIOPSY N/A 02/28/2016    Procedure: UGI ENDOSCOPY; WITH BIOPSY, SINGLE OR MULTIPLE;  Surgeon: Liane Comber, MD;  [facility]: [facility];  Service: Gastroenterology   ??? PR UPPER GI ENDOSCOPY,DIAGNOSIS N/A 05/07/2016    Procedure: UGI ENDO, INCLUDE ESOPHAGUS, STOMACH, & DUODENUM &/OR JEJUNUM; DX W/WO COLLECTION SPECIMN, BY BRUSH OR WASH;  Surgeon: Modena Nunnery, MD;  [facility]: [facility];  Service: Gastroenterology

## 2020-02-16 MED FILL — NITROGLYCERIN 400 MCG/SPRAY TRANSLINGUAL: 10 days supply | Qty: 12 | Fill #0 | Status: AC

## 2020-02-16 MED FILL — METOPROLOL SUCCINATE ER 100 MG TABLET,EXTENDED RELEASE 24 HR: 30 days supply | Qty: 30 | Fill #0 | Status: AC

## 2020-02-16 MED FILL — ISOSORBIDE MONONITRATE ER 60 MG TABLET,EXTENDED RELEASE 24 HR: 21 days supply | Qty: 21 | Fill #0 | Status: AC

## 2020-02-16 NOTE — Unmapped (Signed)
Dialysis Access Coordinator Note:  Jeremy Oliver, DOB: 1970-12-08  Contact: phone, does NOT seem to use mychart  Challenges: -  COVID-19 status: vaccinated as of 08/2019  Pt is - HAND dominant.     Issue/Concern:  Received ibm referral from [facility]le Poe on 12/28/2019: needs a vascular access. ??Vessel mapping completed 8/23    Current Dialysis Access:   RIJ tunneled catheter [facility] 07/27/2019    Previous dialysis access history:   ?    Central Access history (tunneled catheters, PICC lines, ports, defibrillators, etc.):  ?    Vessel Mapping/Studies:   Vessel Mapping 11/21/2019 [place]    NOTES:   02/16/2020  Pt was No Show to consult appt today, 02/16/2020. Left vm for pt asking him to update [place] on status.  Request to re-schedule pt sent.  This note copied to dialysis center via Epic route fax.     01/10/2020  Received ibm referral from [facility]le Poe on 12/28/2019: needs a vascular access. ??Vessel mapping completed 8/23   Complex cardiac history  WQ referral placed for tracking purposes.   Request to schedule in clinic visit on or about 02/09/2020 sent.     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  PSH:  has a past surgical history that includes Knee surgery (Right, 09/23/2006); Cardiac catheterization (03/08/2007); Cystourethroscopy (12/31/2009 ); pr inser hart pacer xvenous atrial (N/A, 05/30/2015); pr ephys eval w/ ablation supravent arrhythmia (N/A, 07/19/2015); pr upper gi endoscopy,biopsy (N/A, 02/28/2016); pr upper gi endoscopy,diagnosis (N/A, 05/07/2016); pr cath place/coron angio, img super/interp,r&l hrt cath, l hrt ventric (N/A, 02/13/2017); pr cath place/coron angio, img super/interp,w left heart ventriculography (N/A, 03/17/2017); pr insert/place flow direct cath (N/A, 03/17/2017); pr ecmo/ecls initiation veno-arterial (N/A, 03/19/2017); pr rebl ves direct,low extrem (Left, 03/19/2017); pr ecmo/ecls rmvl prph cannula open 6 yrs & older (Left, 03/25/2017); pr remv art clot iliac-pop,leg incis (Left, 03/25/2017); pr negative pressure wound therapy dme >50 sq cm (Left, 03/25/2017); pr insj/rplcmt perm dfb w/trnsvns lds 1/dual chmbr (N/A, 05/04/2017); pr prq trluml coronary stent w/angio one art/brnch (N/A, 03/12/2018); pr cath place/coron angio, img super/interp,w left heart ventriculography (N/A, 05/12/2018); pr cath place/coron angio, img super/interp,w left heart ventriculography (N/A, 10/22/2018); Coronary stent placement; pr cath place/coron angio, img super/interp,w left heart ventriculography (N/A, 08/19/2019); and pr prq trluml coronary stent w/angio one art/brnch (N/A, 01/27/2020).  Labs on 11/09/2019: sCr 4.69; GFR 13  Anticoagulant/Antiplatelet medications: apixaban ; Indications: hx MI ; This medication status was reviewed on: 01/10/2020 via chart review    Nephrologist: - referral from [facility]:  unknown  Dialysis Days:   unknown    Pt lives in: [place], [place]    [place], [place]  Dialysis Access Coordinator for Dr. Zara Chess of [facility] for Transplant Care  [address] [place], [place] [address]  Desk: [phone]  Fax: [phone]

## 2020-02-16 NOTE — Unmapped (Signed)
I called [facility] in [place] and pharmacist that patient picked up the Ntg spray today.  The PA with [employer] was approved.

## 2020-02-21 NOTE — Unmapped (Signed)
Patient has received a call to confirm an appointment that has been request by the provider.. The patient as agreed to the date/time provided by the scheduler.  04/06/19 130 pm

## 2020-02-23 ENCOUNTER — Ambulatory Visit: Admit: 2020-02-23 | Discharge: 2020-02-26 | Disposition: A | Discharge status: Home / Self Care | Payer: MEDICARE

## 2020-02-23 ENCOUNTER — Encounter: Admit: 2020-02-23 | Discharge: 2020-02-26 | Disposition: A | Discharge status: Home / Self Care | Payer: MEDICARE

## 2020-02-23 DIAGNOSIS — E1122 Type 2 diabetes mellitus with diabetic chronic kidney disease: Principal | ICD-10-CM

## 2020-02-23 DIAGNOSIS — R079 Chest pain, unspecified: Principal | ICD-10-CM

## 2020-02-23 DIAGNOSIS — M109 Gout, unspecified: Principal | ICD-10-CM

## 2020-02-23 DIAGNOSIS — D631 Anemia in chronic kidney disease: Principal | ICD-10-CM

## 2020-02-23 DIAGNOSIS — Z794 Long term (current) use of insulin: Principal | ICD-10-CM

## 2020-02-23 DIAGNOSIS — F17220 Nicotine dependence, chewing tobacco, uncomplicated: Principal | ICD-10-CM

## 2020-02-23 DIAGNOSIS — I6602 Occlusion and stenosis of left middle cerebral artery: Principal | ICD-10-CM

## 2020-02-23 DIAGNOSIS — Z20822 Contact with and (suspected) exposure to covid-19: Principal | ICD-10-CM

## 2020-02-23 DIAGNOSIS — I25119 Atherosclerotic heart disease of native coronary artery with unspecified angina pectoris: Principal | ICD-10-CM

## 2020-02-23 DIAGNOSIS — I5022 Chronic systolic (congestive) heart failure: Principal | ICD-10-CM

## 2020-02-23 DIAGNOSIS — E872 Acidosis: Principal | ICD-10-CM

## 2020-02-23 DIAGNOSIS — F4322 Adjustment disorder with anxiety: Principal | ICD-10-CM

## 2020-02-23 DIAGNOSIS — F410 Panic disorder [episodic paroxysmal anxiety] without agoraphobia: Principal | ICD-10-CM

## 2020-02-23 DIAGNOSIS — I132 Hypertensive heart and chronic kidney disease with heart failure and with stage 5 chronic kidney disease, or end stage renal disease: Principal | ICD-10-CM

## 2020-02-23 DIAGNOSIS — Z8673 Personal history of transient ischemic attack (TIA), and cerebral infarction without residual deficits: Principal | ICD-10-CM

## 2020-02-23 DIAGNOSIS — R778 Other specified abnormalities of plasma proteins: Principal | ICD-10-CM

## 2020-02-23 DIAGNOSIS — E7800 Pure hypercholesterolemia, unspecified: Principal | ICD-10-CM

## 2020-02-23 DIAGNOSIS — G894 Chronic pain syndrome: Principal | ICD-10-CM

## 2020-02-23 DIAGNOSIS — Z955 Presence of coronary angioplasty implant and graft: Principal | ICD-10-CM

## 2020-02-23 DIAGNOSIS — E875 Hyperkalemia: Principal | ICD-10-CM

## 2020-02-23 DIAGNOSIS — Z992 Dependence on renal dialysis: Principal | ICD-10-CM

## 2020-02-23 DIAGNOSIS — N2581 Secondary hyperparathyroidism of renal origin: Principal | ICD-10-CM

## 2020-02-23 DIAGNOSIS — Z86718 Personal history of other venous thrombosis and embolism: Principal | ICD-10-CM

## 2020-02-23 DIAGNOSIS — K219 Gastro-esophageal reflux disease without esophagitis: Principal | ICD-10-CM

## 2020-02-23 DIAGNOSIS — I252 Old myocardial infarction: Principal | ICD-10-CM

## 2020-02-23 DIAGNOSIS — Z791 Long term (current) use of non-steroidal anti-inflammatories (NSAID): Principal | ICD-10-CM

## 2020-02-23 DIAGNOSIS — G4733 Obstructive sleep apnea (adult) (pediatric): Principal | ICD-10-CM

## 2020-02-23 DIAGNOSIS — Z7901 Long term (current) use of anticoagulants: Principal | ICD-10-CM

## 2020-02-23 DIAGNOSIS — F3342 Major depressive disorder, recurrent, in full remission: Principal | ICD-10-CM

## 2020-02-23 DIAGNOSIS — I480 Paroxysmal atrial fibrillation: Principal | ICD-10-CM

## 2020-02-23 DIAGNOSIS — I214 Non-ST elevation (NSTEMI) myocardial infarction: Principal | ICD-10-CM

## 2020-02-23 DIAGNOSIS — N186 End stage renal disease: Principal | ICD-10-CM

## 2020-02-23 DIAGNOSIS — E785 Hyperlipidemia, unspecified: Principal | ICD-10-CM

## 2020-02-23 DIAGNOSIS — Z950 Presence of cardiac pacemaker: Principal | ICD-10-CM

## 2020-02-23 DIAGNOSIS — E1143 Type 2 diabetes mellitus with diabetic autonomic (poly)neuropathy: Principal | ICD-10-CM

## 2020-02-23 LAB — CBC W/ AUTO DIFF
BASOPHILS ABSOLUTE COUNT: 0.1 10*9/L (ref 0.0–0.1)
BASOPHILS RELATIVE PERCENT: 1.2 %
EOSINOPHILS ABSOLUTE COUNT: 0.2 10*9/L (ref 0.0–0.7)
EOSINOPHILS RELATIVE PERCENT: 2 %
HEMATOCRIT: 34.4 % — ABNORMAL LOW (ref 38.0–50.0)
HEMOGLOBIN: 11.3 g/dL — ABNORMAL LOW (ref 13.5–17.5)
LYMPHOCYTES ABSOLUTE COUNT: 1.8 10*9/L (ref 0.7–4.0)
LYMPHOCYTES RELATIVE PERCENT: 20.9 %
MEAN CORPUSCULAR HEMOGLOBIN CONC: 32.9 g/dL (ref 30.0–36.0)
MEAN CORPUSCULAR HEMOGLOBIN: 28 pg (ref 26.0–34.0)
MEAN CORPUSCULAR VOLUME: 85.1 fL (ref 81.0–95.0)
MEAN PLATELET VOLUME: 8.6 fL (ref 7.0–10.0)
MONOCYTES ABSOLUTE COUNT: 0.5 10*9/L (ref 0.1–1.0)
MONOCYTES RELATIVE PERCENT: 6.2 %
NEUTROPHILS ABSOLUTE COUNT: 6.1 10*9/L (ref 1.7–7.7)
NEUTROPHILS RELATIVE PERCENT: 69.7 %
NUCLEATED RED BLOOD CELLS: 0 /100{WBCs} (ref <=4)
PLATELET COUNT: 199 10*9/L (ref 150–450)
RED BLOOD CELL COUNT: 4.05 10*12/L — ABNORMAL LOW (ref 4.32–5.72)
RED CELL DISTRIBUTION WIDTH: 16.6 % — ABNORMAL HIGH (ref 12.0–15.0)
WBC ADJUSTED: 8.7 10*9/L (ref 3.5–10.5)

## 2020-02-23 LAB — HIGH SENSITIVITY TROPONIN I - 2 HOUR SERIAL
HIGH SENSITIVITY TROPONIN - DELTA (0-2H): 282 ng/L (ref <=7)
HIGH-SENSITIVITY TROPONIN I - 2 HOUR: 422 ng/L (ref <=53)

## 2020-02-23 LAB — PROTIME-INR
INR: 1.07
PROTIME: 12.5 s (ref 10.3–13.4)

## 2020-02-23 LAB — COMPREHENSIVE METABOLIC PANEL
ALBUMIN: 2.8 g/dL — ABNORMAL LOW (ref 3.4–5.0)
ALKALINE PHOSPHATASE: 102 U/L (ref 46–116)
ALT (SGPT): 9 U/L — ABNORMAL LOW (ref 10–49)
ANION GAP: 11 mmol/L (ref 5–14)
AST (SGOT): 12 U/L (ref <=34)
BILIRUBIN TOTAL: 0.2 mg/dL — ABNORMAL LOW (ref 0.3–1.2)
BLOOD UREA NITROGEN: 26 mg/dL — ABNORMAL HIGH (ref 9–23)
BUN / CREAT RATIO: 5
CALCIUM: 8.2 mg/dL — ABNORMAL LOW (ref 8.7–10.4)
CHLORIDE: 102 mmol/L (ref 98–107)
CO2: 25.5 mmol/L (ref 20.0–31.0)
CREATININE: 4.97 mg/dL — ABNORMAL HIGH
EGFR CKD-EPI AA MALE: 14 mL/min/{1.73_m2} — ABNORMAL LOW (ref >=60)
EGFR CKD-EPI NON-AA MALE: 12 mL/min/{1.73_m2} — ABNORMAL LOW (ref >=60)
GLUCOSE RANDOM: 275 mg/dL — ABNORMAL HIGH (ref 70–179)
POTASSIUM: 4.2 mmol/L (ref 3.4–4.5)
PROTEIN TOTAL: 6.7 g/dL (ref 5.7–8.2)
SODIUM: 138 mmol/L (ref 135–145)

## 2020-02-23 LAB — HIGH SENSITIVITY TROPONIN I - 6 HOUR SERIAL
HIGH SENSITIVITY TROPONIN - DELTA (2-6H): 3171 ng/L (ref <=7)
HIGH-SENSITIVITY TROPONIN I - 6 HOUR: 3593 ng/L (ref <=53)

## 2020-02-23 LAB — HIGH SENSITIVITY TROPONIN I - SERIAL: HIGH SENSITIVITY TROPONIN I: 140 ng/L (ref <=53)

## 2020-02-23 MED ADMIN — nitroglycerin (NITROSTAT) 0.4 MG SL tablet: @ 22:00:00 | Stop: 2020-02-23

## 2020-02-23 MED ADMIN — aspirin 81 MG chewable tablet: @ 21:00:00 | Stop: 2020-02-23

## 2020-02-24 DIAGNOSIS — Z86718 Personal history of other venous thrombosis and embolism: Principal | ICD-10-CM

## 2020-02-24 DIAGNOSIS — Z20822 Contact with and (suspected) exposure to covid-19: Principal | ICD-10-CM

## 2020-02-24 DIAGNOSIS — E875 Hyperkalemia: Principal | ICD-10-CM

## 2020-02-24 DIAGNOSIS — G4733 Obstructive sleep apnea (adult) (pediatric): Principal | ICD-10-CM

## 2020-02-24 DIAGNOSIS — N2581 Secondary hyperparathyroidism of renal origin: Principal | ICD-10-CM

## 2020-02-24 DIAGNOSIS — E1122 Type 2 diabetes mellitus with diabetic chronic kidney disease: Principal | ICD-10-CM

## 2020-02-24 DIAGNOSIS — I480 Paroxysmal atrial fibrillation: Principal | ICD-10-CM

## 2020-02-24 DIAGNOSIS — N186 End stage renal disease: Principal | ICD-10-CM

## 2020-02-24 DIAGNOSIS — D631 Anemia in chronic kidney disease: Principal | ICD-10-CM

## 2020-02-24 DIAGNOSIS — G894 Chronic pain syndrome: Principal | ICD-10-CM

## 2020-02-24 DIAGNOSIS — Z950 Presence of cardiac pacemaker: Principal | ICD-10-CM

## 2020-02-24 DIAGNOSIS — M109 Gout, unspecified: Principal | ICD-10-CM

## 2020-02-24 DIAGNOSIS — Z794 Long term (current) use of insulin: Principal | ICD-10-CM

## 2020-02-24 DIAGNOSIS — Z7901 Long term (current) use of anticoagulants: Principal | ICD-10-CM

## 2020-02-24 DIAGNOSIS — R079 Chest pain, unspecified: Principal | ICD-10-CM

## 2020-02-24 DIAGNOSIS — I132 Hypertensive heart and chronic kidney disease with heart failure and with stage 5 chronic kidney disease, or end stage renal disease: Principal | ICD-10-CM

## 2020-02-24 DIAGNOSIS — I252 Old myocardial infarction: Principal | ICD-10-CM

## 2020-02-24 DIAGNOSIS — Z791 Long term (current) use of non-steroidal anti-inflammatories (NSAID): Principal | ICD-10-CM

## 2020-02-24 DIAGNOSIS — I6602 Occlusion and stenosis of left middle cerebral artery: Principal | ICD-10-CM

## 2020-02-24 DIAGNOSIS — Z955 Presence of coronary angioplasty implant and graft: Principal | ICD-10-CM

## 2020-02-24 DIAGNOSIS — F3342 Major depressive disorder, recurrent, in full remission: Principal | ICD-10-CM

## 2020-02-24 DIAGNOSIS — F17220 Nicotine dependence, chewing tobacco, uncomplicated: Principal | ICD-10-CM

## 2020-02-24 DIAGNOSIS — I25119 Atherosclerotic heart disease of native coronary artery with unspecified angina pectoris: Principal | ICD-10-CM

## 2020-02-24 DIAGNOSIS — Z992 Dependence on renal dialysis: Principal | ICD-10-CM

## 2020-02-24 DIAGNOSIS — E785 Hyperlipidemia, unspecified: Principal | ICD-10-CM

## 2020-02-24 DIAGNOSIS — E872 Acidosis: Principal | ICD-10-CM

## 2020-02-24 DIAGNOSIS — E7800 Pure hypercholesterolemia, unspecified: Principal | ICD-10-CM

## 2020-02-24 DIAGNOSIS — I5022 Chronic systolic (congestive) heart failure: Principal | ICD-10-CM

## 2020-02-24 DIAGNOSIS — K219 Gastro-esophageal reflux disease without esophagitis: Principal | ICD-10-CM

## 2020-02-24 DIAGNOSIS — F4322 Adjustment disorder with anxiety: Principal | ICD-10-CM

## 2020-02-24 DIAGNOSIS — Z8673 Personal history of transient ischemic attack (TIA), and cerebral infarction without residual deficits: Principal | ICD-10-CM

## 2020-02-24 DIAGNOSIS — I214 Non-ST elevation (NSTEMI) myocardial infarction: Principal | ICD-10-CM

## 2020-02-24 DIAGNOSIS — F410 Panic disorder [episodic paroxysmal anxiety] without agoraphobia: Principal | ICD-10-CM

## 2020-02-24 LAB — CBC W/ AUTO DIFF
BASOPHILS ABSOLUTE COUNT: 0 10*9/L (ref 0.0–0.1)
BASOPHILS RELATIVE PERCENT: 0.5 %
EOSINOPHILS ABSOLUTE COUNT: 0.2 10*9/L (ref 0.0–0.4)
EOSINOPHILS RELATIVE PERCENT: 2.8 %
HEMATOCRIT: 33.2 % — ABNORMAL LOW (ref 41.0–53.0)
HEMOGLOBIN: 10.3 g/dL — ABNORMAL LOW (ref 13.5–17.5)
LARGE UNSTAINED CELLS: 1 % (ref 0–4)
LYMPHOCYTES ABSOLUTE COUNT: 1.3 10*9/L — ABNORMAL LOW (ref 1.5–5.0)
LYMPHOCYTES RELATIVE PERCENT: 16.1 %
MEAN CORPUSCULAR HEMOGLOBIN CONC: 31.1 g/dL (ref 31.0–37.0)
MEAN CORPUSCULAR HEMOGLOBIN: 28.2 pg (ref 26.0–34.0)
MEAN CORPUSCULAR VOLUME: 90.9 fL (ref 80.0–100.0)
MEAN PLATELET VOLUME: 8.9 fL (ref 7.0–10.0)
MONOCYTES ABSOLUTE COUNT: 0.3 10*9/L (ref 0.2–0.8)
MONOCYTES RELATIVE PERCENT: 3.6 %
NEUTROPHILS ABSOLUTE COUNT: 5.9 10*9/L (ref 2.0–7.5)
NEUTROPHILS RELATIVE PERCENT: 75.8 %
PLATELET COUNT: 200 10*9/L (ref 150–440)
RED BLOOD CELL COUNT: 3.65 10*12/L — ABNORMAL LOW (ref 4.50–5.90)
RED CELL DISTRIBUTION WIDTH: 16.5 % — ABNORMAL HIGH (ref 12.0–15.0)
WBC ADJUSTED: 7.7 10*9/L (ref 4.5–11.0)

## 2020-02-24 LAB — APTT
APTT: 55.5 s — ABNORMAL HIGH (ref 24.9–36.9)
HEPARIN CORRELATION: 0.3

## 2020-02-24 LAB — BASIC METABOLIC PANEL
ANION GAP: 9 mmol/L (ref 5–14)
BLOOD UREA NITROGEN: 28 mg/dL — ABNORMAL HIGH (ref 9–23)
BUN / CREAT RATIO: 5
CALCIUM: 7.8 mg/dL — ABNORMAL LOW (ref 8.7–10.4)
CHLORIDE: 102 mmol/L (ref 98–107)
CO2: 27 mmol/L (ref 20.0–31.0)
CREATININE: 5.35 mg/dL — ABNORMAL HIGH
EGFR CKD-EPI AA MALE: 13 mL/min/{1.73_m2} — ABNORMAL LOW (ref >=60)
EGFR CKD-EPI NON-AA MALE: 11 mL/min/{1.73_m2} — ABNORMAL LOW (ref >=60)
GLUCOSE RANDOM: 139 mg/dL (ref 70–179)
POTASSIUM: 3.6 mmol/L (ref 3.4–4.5)
SODIUM: 138 mmol/L (ref 135–145)

## 2020-02-24 LAB — MAGNESIUM: MAGNESIUM: 1.5 mg/dL — ABNORMAL LOW (ref 1.6–2.6)

## 2020-02-24 LAB — HEMOGLOBIN AND HEMATOCRIT, BLOOD
HEMATOCRIT: 33.2 % — ABNORMAL LOW (ref 41.0–53.0)
HEMOGLOBIN: 10.5 g/dL — ABNORMAL LOW (ref 13.5–17.5)

## 2020-02-24 LAB — HIGH SENSITIVITY TROPONIN I - SINGLE
HIGH SENSITIVITY TROPONIN I: 6076 ng/L (ref <=53)
HIGH SENSITIVITY TROPONIN I: 3966 ng/L (ref <=53)
HIGH SENSITIVITY TROPONIN I: 3171 ng/L (ref <=53)

## 2020-02-24 LAB — PHOSPHORUS: PHOSPHORUS: 5.3 mg/dL — ABNORMAL HIGH (ref 2.4–5.1)

## 2020-02-24 MED ADMIN — pantoprazole (PROTONIX) EC tablet 40 mg: 40 mg | ORAL | @ 03:00:00 | Stop: 2020-02-26

## 2020-02-24 MED ADMIN — insulin lispro (HumaLOG) injection 12 Units: 12 [IU] | SUBCUTANEOUS | @ 23:00:00 | Stop: 2020-02-26

## 2020-02-24 MED ADMIN — ticagrelor (BRILINTA) tablet 90 mg: 90 mg | ORAL | @ 15:00:00 | Stop: 2020-02-26

## 2020-02-24 MED ADMIN — insulin lispro (HumaLOG) injection 0-12 Units: 0-12 [IU] | SUBCUTANEOUS | @ 23:00:00 | Stop: 2020-02-26

## 2020-02-24 MED ADMIN — ticagrelor (BRILINTA) tablet 90 mg: 90 mg | ORAL | @ 03:00:00 | Stop: 2020-02-26

## 2020-02-24 MED ADMIN — bumetanide (BUMEX) tablet 2 mg: 2 mg | ORAL | @ 11:00:00 | Stop: 2020-02-24

## 2020-02-24 MED ADMIN — sevelamer (RENVELA) tablet 800 mg: 800 mg | ORAL | @ 03:00:00 | Stop: 2020-02-26

## 2020-02-24 MED ADMIN — atorvastatin (LIPITOR) tablet 80 mg: 80 mg | ORAL | @ 23:00:00 | Stop: 2020-02-26

## 2020-02-24 MED ADMIN — gabapentin (NEURONTIN) capsule 300 mg: 300 mg | ORAL | @ 15:00:00 | Stop: 2020-02-26

## 2020-02-24 MED ADMIN — amiodarone (PACERONE) tablet 200 mg: 200 mg | ORAL | @ 15:00:00 | Stop: 2020-02-26

## 2020-02-24 MED ADMIN — metoprolol succinate (TOPROL-XL) 24 hr tablet 100 mg: 100 mg | ORAL | @ 03:00:00 | Stop: 2020-02-26

## 2020-02-24 MED ADMIN — heparin 25,000 Units/250 mL (100 units/mL) in 0.45% saline infusion (premade): 12 [IU]/kg/h | INTRAVENOUS | @ 21:00:00 | Stop: 2020-02-26

## 2020-02-24 MED ADMIN — aspirin chewable tablet 81 mg: 81 mg | ORAL | @ 15:00:00 | Stop: 2020-02-26

## 2020-02-24 MED ADMIN — sevelamer (RENVELA) tablet 800 mg: 800 mg | ORAL | @ 23:00:00 | Stop: 2020-02-26

## 2020-02-24 MED ADMIN — insulin glargine (LANTUS) injection 20 Units: 20 [IU] | SUBCUTANEOUS | @ 07:00:00 | Stop: 2020-02-26

## 2020-02-24 MED ADMIN — heparin (porcine) 1000 unit/mL injection 4,000 Units: 4000 [IU] | INTRAVENOUS | @ 07:00:00 | Stop: 2020-02-24

## 2020-02-24 MED ADMIN — bumetanide (BUMEX) tablet 2 mg: 2 mg | ORAL | @ 20:00:00 | Stop: 2020-02-24

## 2020-02-24 MED ADMIN — isosorbide mononitrate (IMDUR) 24 hr tablet 60 mg: 60 mg | ORAL | @ 03:00:00 | Stop: 2020-02-26

## 2020-02-24 MED ADMIN — ezetimibe (ZETIA) tablet 10 mg: 10 mg | ORAL | @ 03:00:00 | Stop: 2020-02-26

## 2020-02-24 MED ADMIN — perflutren protein type-A microspheres (OPTISON) injection 2 mL: 2 mL | INTRAVENOUS | @ 18:00:00 | Stop: 2020-02-24

## 2020-02-24 MED ADMIN — bumetanide (BUMEX) tablet 2 mg: 2 mg | ORAL | @ 03:00:00 | Stop: 2020-02-24

## 2020-02-24 MED ADMIN — heparin 25,000 Units/250 mL (100 units/mL) in 0.45% saline infusion (premade): 12 [IU]/kg/h | INTRAVENOUS | @ 07:00:00 | Stop: 2020-02-26

## 2020-02-24 NOTE — Unmapped (Signed)
[facility]  Emergency Department Provider Note        ED Clinical Impression      Final diagnoses:   None           Impression, ED Course, Assessment and Plan      Impression: Jeremy Oliver is a 49 y.o. male with PMH significant for paroxysmal afib on brilinta, chronic pain syndrome, CAD, T2DM c/b peripheral neuropathy, ESRD on dialysis , GERD, gout, HTN, HLD, migraines, nephrolithiasis, NSTEMI, NAFLD, systolic CHF who presents to the emergency department today for evaluation of chest pain.  Onset 2 hours prior to arrival.  Associated symptoms include palpitations, diaphoresis, slight lightheadedness and slight shortness of breath.    Patient alert and oriented, tachycardic to 130s, irregularly irregular rhythm, palpable symmetric radial pulses, lungs clear to auscultation.  He is mildly diaphoretic.  Abdomen soft nontender.  No distal edema.  Patient given 324 mg chewable aspirin and sublingual nitroglycerin.    ED Course as of 02/23/20 2102   Thu Feb 23, 2020   1636 ECG 12 Lead  Atrial fibrillation with rapid ventricular response, rate 133 bpm, left axis deviation, QRS interval 100 ms with left bundle branch block morphology, QTC 509 ms, slight ST elevation in aVR, ST depressions in lateral precordial and lateral leads.  When compared with EKG from 01/27/2020 A. fib and ST changes are new.   1710 ECG 12 Lead  Repeat EKG shows patient converted to normal sinus rhythm, rate 77 bpm, left axis deviation, PR interval prolonged at 218 ms consistent first-degree AV block, QRS 100 ms, QTC 506 ms.  ST elevation in aVR resolved, ST depression in lateral precordial's and lateral leads still roughly 1 box however improved from prior.   1711 hsTroponin I(!!): 140  Difficult to interpret this value of 140 as patient is ESRD on HD, previous troponins 2000 during hospitalization for NSTEMI.  We will continue to trend.    1718 Potassium: 4.2   1722 XR Chest 2 views  Mild interstitial pulmonary edema, although superimposed infection cannot be excluded     Patient has no infectious symptoms and do not suspect viral or bacterial pneumonia.   1802 Patient reassessed. He states he is feeling better; much less chest pain, not short of breath, no longer diaphoretic.    1848 ECG 12 Lead  3rd EKG, normal sinus rhythm, rate 70 bpm, normal axis, PR interval prolonged at 262 ms, QRS 98 ms, QTC 503 ms, no ST elevation or depression, T wave inversions in lateral precordial lateral leads.  When compared to prior EKG ST depression in lateral precordial lateral leads resolved.   1905 hsTroponin I(!!): 422  Troponin rising.  EKG however has normalized with resolution of ST changes.  Suspect type II MI, demand ischemia from A. fib RVR.  I feel reassured that he is remaining in sinus rhythm with rate in 70s.  Nevertheless, feel patient will benefit from overnight observation, troponin trending, and cardiology assessment.  Will page [facility] for admission.        Additional Medical Decision Making     I have reviewed the vital signs and the nursing notes. Labs and radiology results that were available during my care of the patient were independently reviewed by me and considered in my medical decision making.     I directly visualized and independently interpreted the EKG tracing.   I independently visualized the radiology images.   I reviewed the patient's prior medical records  Portions of this record have been created using Scientist, clinical (histocompatibility and immunogenetics). Dictation errors have been sought, but Moseley not have been identified and corrected.  ____________________________________________         History        Chief Complaint  Chest Pain      HPI   Jeremy Oliver is a 49 y.o. male with PMH significant for paroxysmal afib on brilinta, chronic pain syndrome, CAD, T2DM c/b peripheral neuropathy, ESRD on dialysis , GERD, gout, HTN, HLD, migraines, nephrolithiasis, NSTEMI, NAFLD, systolic CHF who presents to the emergency department today for evaluation of chest pain.  Patient reports initial onset of symptoms was 2:30 pm.  Patient was making Thanksgiving feast during onset of pain.  He is feeling very well this morning, symptoms were abrupt in onset.  He endorses substernal and left-sided pain radiating to left neck.  Associated symptoms include palpitations, diaphoresis, and mild lightheadedness.  He does not feel presyncopal at this time.  He also endorses mild shortness of breath.  He denies any recent fever chills, denies cough, runny nose, sore throat, nausea, vomiting, abdominal pain.  He denies any peripheral edema.  He received a full session of dialysis yesterday; felt somewhat weak globally afterwards but otherwise it was unremarkable session.  Patient still makes urine, denies any dysuria urinary frequency, urine has been clear.  He states he had forgotten to take his morning meds however when symptoms began at 230 he was reminded so took his morning meds at that time (metoprolol, amiodarone, and Imdur)    Per review of chart, patient with recent PCI on 01/27/2020 to the LMCA/LAD (CAD including 80% LMCA, 90% LAD stenosis).  Has had previous PCI to LAD and LCx 08/19/2019, PCI to proximal to mid LAD 05/12/2018, PCI to mid RCA and or PLA/R DPA bifurcation 03/12/2018.  Most recent echocardiogram on file 01/26/2020 revealed LVEF 35 to 40%.            Past Medical History:   Diagnosis Date   ??? Angina at rest (CMS-[name])    ??? Arthritis    ??? Asthma    ??? Atrial fibrillation and flutter (CMS-[name]) 06/08/14   ??? BMI 40.0-44.9, adult (CMS-[name]) 10/29/2015   ??? Chronic anticoagulation 02/17/2018   ??? Chronic pain syndrome 04/11/2014    ~Use daily lyrica as recommended ~Tramadol as needed for pain ~Fluoxetine daily as recommended ~Daily exercise ~Eat a healthy diet ~Good control of your blood sugars can help    ??? Coronary artery disease    ??? Depression    ??? Diabetes mellitus (CMS-[name])    ??? ESRD (end stage renal disease) on dialysis (CMS-[name])    ??? Eye trauma 1998 glass in both eyes and removed   ??? GERD (gastroesophageal reflux disease)    ??? Gout    ??? Homeless    ??? Hyperlipidemia    ??? Hypertension    ??? Illiteracy and low-level literacy    ??? Microscopic hematuria    ??? Migraine    ??? Nephrolithiasis    ??? 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 Nonalcoholic  fatty liver disease    ??? NSTEMI (non-ST elevated myocardial infarction) (CMS-[name]) 01/13/2017   ??? Obesity    ??? Obstructive sleep apnea 2009   ??? Panic attacks    ??? 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 Seizure-like activity (CMS-[name])     ~Monitor for symptoms and report them to your primary care provider if they occur    ??? Systolic CHF, chronic (CMS-[name])     EF 40-45% 2017   ??? TIA (transient ischemic attack)        Patient Active Problem List   Diagnosis   ??? 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 Idiopathic chronic gout of left knee   ??? Hypercholesteremia   ??? Diabetes mellitus with coincident hypertension (CMS-[name])   ??? 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 Osteoarthritis   ??? Chronic nonalcoholic liver disease   ??? GERD (gastroesophageal reflux disease)   ??? Mild intermittent asthma without complication   ??? Chronic pain syndrome   ??? Paroxysmal atrial fibrillation (CMS-[name])   ??? OSA (obstructive sleep apnea)   ??? Tobacco use disorder   ??? Status post cardiac pacemaker procedure   ??? Diabetes mellitus with gastroparesis (CMS-[name])   ??? Cardiac pacemaker in situ   ??? Recurrent major depressive disorder, in full remission (CMS-[name])   ??? NSTEMI (non-ST elevation myocardial infarction) (CMS-[name])   ??? Adjustment disorder with mixed anxiety and depressed mood   ??? HFrEF (heart failure with reduced ejection fraction) (CMS-[name])   ??? Hyperkalemia   ??? Acquired left foot drop   ??? Decreased sensation of lower extremity, left   ??? Chronic anticoagulation   ??? Type 2 diabetes mellitus with stage 4 chronic kidney disease, with long-term current use of insulin (CMS-[name])   ??? Situational stress   ??? Cough   ??? QT prolongation   ??? ESRD needing dialysis (CMS-[name])   ??? Palliative care by specialist   ??? Angina pectoris (CMS-[name])   ??? Psychophysiological insomnia       Past Surgical History:   Procedure Laterality Date   ??? CARDIAC CATHETERIZATION  03/08/2007   ??? CORONARY STENT PLACEMENT     ??? CYSTOURETHROSCOPY  12/31/2009     Microscopic hematuria   ??? KNEE SURGERY Right 09/23/2006    arthroscopic knee surgery medial and lateral meniscus tears as well as crystal disease   ??? PR CATH PLACE/CORON ANGIO, IMG SUPER/INTERP,R&L HRT CATH, L HRT VENTRIC N/A 02/13/2017    Procedure: Left/Right Heart Catheterization;  Surgeon: Marlaine Hind, MD;  Location: [facility] CATH;  Service: Cardiology   ??? PR CATH PLACE/CORON ANGIO, IMG SUPER/INTERP,W LEFT HEART VENTRICULOGRAPHY N/A 03/17/2017    Procedure: Left Heart Catheterization W Intervention;  Surgeon: Marlaine Hind, MD;  Location: [facility] CATH;  Service: Cardiology   ??? PR CATH PLACE/CORON ANGIO, IMG SUPER/INTERP,W LEFT HEART VENTRICULOGRAPHY N/A 05/12/2018    Procedure: CATH LEFT HEART CATHETERIZATION W INTERVENTION;  Surgeon: Dorathy Kinsman, MD;  Location: [facility] CATH;  Service: Cardiology   ??? PR CATH PLACE/CORON ANGIO, IMG SUPER/INTERP,W LEFT HEART VENTRICULOGRAPHY N/A 10/22/2018    Procedure: CATH LEFT HEART CATHETERIZATION W INTERVENTION;  Surgeon: Marlaine Hind, MD;  Location: [facility] CATH;  Service: Cardiology   ??? PR CATH PLACE/CORON ANGIO, IMG SUPER/INTERP,W LEFT HEART VENTRICULOGRAPHY N/A 08/19/2019    Procedure: Left Heart Catheterization W Intervention;  Surgeon: Marlaine Hind, MD;  Location: [facility] CATH;  Service: Cardiology   ??? PR ECMO/ECLS INITIATION VENO-ARTERIAL N/A 03/19/2017    Procedure: ECMO/ECLS; INITIATION, VENO-ARTERIAL;  Surgeon: Lennie Odor, MD;  Location: MAIN OR [facility];  Service: Cardiac Surgery   ??? PR ECMO/ECLS RMVL PRPH CANNULA OPEN 6 YRS & OLDER Left 03/25/2017    Procedure: ECMO / ECLS PROVIDED BY PHYSICIAN; REMOVAL OF PERIPHERAL (ARTERIAL AND/OR VENOUS) CANNULA(E), OPEN, 6 YEARS AND OLDER;  Surgeon: Alonna Buckler Ikonomidis, MD;  Location: MAIN OR [facility];  Service: Cardiac Surgery   ??? PR EPHYS EVAL W/ ABLATION SUPRAVENT ARRHYTHMIA N/A 07/19/2015    Catheter ablation of cavotricuspid isthmus for atrial flutter [facility])   ??? PR INSER HART PACER XVENOUS ATRIAL N/A 05/30/2015    [employer] dual-chamber pacemaker implant (E.Chung)   ??? PR INSERT/PLACE FLOW DIRECT CATH N/A 03/17/2017    Procedure: Insert Leave [place];  Surgeon: Marlaine Hind, MD;  Location: [facility] CATH;  Service: Cardiology   ??? PR INSJ/RPLCMT PERM DFB W/TRNSVNS LDS 1/DUAL CHMBR N/A 05/04/2017    Procedure: ICD Implant System (Single/Dual);  Surgeon: Eldred Manges, MD;  Location: [facility] CATH;  Service: Cardiology   ??? PR NEGATIVE PRESSURE WOUND THERAPY DME >50 SQ CM Left 03/25/2017    Procedure: Neg Press Wound Tx (Vac Assist) Incl Topicals, Per Session, Tsa Greater Than/= 50 Cm Squared;  Surgeon: Alonna Buckler Ikonomidis, MD;  Location: MAIN OR [facility];  Service: Cardiac Surgery   ??? PR PRQ TRLUML CORONARY STENT W/ANGIO ONE ART/BRNCH N/A 03/12/2018    Procedure: Percutaneous Coronary Intervention;  Surgeon: Marlaine Hind, MD;  Location: [facility] CATH;  Service: Cardiology   ??? PR PRQ TRLUML CORONARY STENT W/ANGIO ONE ART/BRNCH N/A 01/27/2020    Procedure: Percutaneous Coronary Intervention;  Surgeon: Marlaine Hind, MD;  Location: [facility] CATH;  Service: Cardiology   ??? PR REBL VES DIRECT,LOW EXTREM Left 03/19/2017    Procedure: Repr Bld Vessel Direct; Lower Extrem;  Surgeon: Boykin Reaper, MD;  Location: MAIN OR [facility];  Service: Vascular   ??? PR REMV ART CLOT ILIAC-POP,LEG INCIS Left 03/25/2017    Procedure: EMBOLECTOMY OR THROMBECTOMY, WITH OR WITHOUT CATHETER; FEMOROPOPLITEAL, AORTOILIAC ARTERY, BY LEG INCISION;  Surgeon: Alonna Buckler Ikonomidis, MD;  Location: MAIN OR [facility];  Service: Cardiac Surgery   ??? PR UPPER GI ENDOSCOPY,BIOPSY N/A 02/28/2016    Procedure: UGI ENDOSCOPY; WITH BIOPSY, SINGLE OR MULTIPLE;  Surgeon: Liane Comber, MD;  Location: [facility];  Service: Gastroenterology   ??? PR UPPER GI ENDOSCOPY,DIAGNOSIS N/A 05/07/2016    Procedure: UGI ENDO, INCLUDE ESOPHAGUS, STOMACH, & DUODENUM &/OR JEJUNUM; DX W/WO COLLECTION SPECIMN, BY BRUSH OR WASH;  Surgeon: Modena Nunnery, MD;  [facility]: [facility];  Service: Gastroenterology         Current Facility-Administered Medications:   ???  acetaminophen (TYLENOL) tablet 650 mg, 650 mg, Oral, Q4H PRN, Dian Situ, MD  ???  [START ON 02/24/2020] amiodarone (PACERONE) tablet 200 mg, 200 mg, Oral, Daily, Dian Situ, MD  ???  atorvastatin (LIPITOR) tablet 80 mg, 80 mg, Oral, Daily, Dian Situ, MD  ???  bumetanide (BUMEX) tablet 2 mg, 2 mg, Oral, BID, Dian Situ, MD  ???  ezetimibe (ZETIA) tablet 10 mg, 10 mg, Oral, Nightly, Dian Situ, MD  ???  isosorbide mononitrate (IMDUR) 24 hr tablet 60 mg, 60 mg, Oral, Daily, Dian Situ, MD  ???  metoprolol succinate (TOPROL-XL) 24 hr tablet 100 mg, 100 mg, Oral, Daily, Tollie Pizza  Joycelyn Man, MD  ???  pantoprazole (PROTONIX) EC tablet 40 mg, 40 mg, Oral, Daily, Dian Situ, MD  ???  sevelamer (RENVELA) tablet 800 mg, 800 mg, Oral, 3xd Meals, Dian Situ, MD  ???  ticagrelor (BRILINTA) tablet 90 mg, 90 mg, Oral, BID, Dian Situ, MD    Current Outpatient Medications:   ???  acetaminophen (TYLENOL) 500 MG tablet, TAKE 2 TABLETS (1000MG ) BY MOUTH EVERY 8 HOURS AS NEEDED FOR PAIN, Disp: 180 tablet, Rfl: 0  ???  albuterol HFA 90 mcg/actuation inhaler, Inhale 2 puffs into the lungs every 6 (six) hours as needed for wheezing or shortness of breath., Disp: 18 g, Rfl: 2  ???  alirocumab (PRALUENT) 150 mg/mL subcutaneous injection, Inject the contents of 1 pen (150 mg total) under the skin every fourteen (14) days., Disp: 6 mL, Rfl: 3  ???  allopurinoL (ZYLOPRIM) 100 MG tablet, Take 2 tablets (200 mg total) by mouth daily., Disp: 180 tablet, Rfl: 0  ???  amiodarone (PACERONE) 200 MG tablet, TAKE 1 TABLET BY MOUTH ONCE DAILY, Disp: 90 tablet, Rfl: 3  ???  apixaban (ELIQUIS) 5 mg Tab, Take 1 tablet (5 mg total) by mouth Two (2) times a day., Disp: 60 tablet, Rfl: 11  ???  atorvastatin (LIPITOR) 80 MG tablet, Take 1 tablet (80 mg total) by mouth daily., Disp: 90 tablet, Rfl: 3  ???  blood sugar diagnostic Strp, Use as directed to check blood glucose Four (4) times a day (before meals and nightly)., Disp: 300 each, Rfl: 3  ???  blood-glucose meter kit, Disp. blood glucose meter kit preferred by patient's insurance. Dx: Diabetes, E11.9, Disp: 1 each, Rfl: 11  ???  bumetanide (BUMEX) 2 MG tablet, Take 1 tablet (2 mg total) by mouth two (2) times a day. On non dialysis days, Disp: 180 tablet, Rfl: 3  ???  cholecalciferol, vitamin D3 25 mcg, 1,000 units,, 1,000 unit (25 mcg) tablet, Take 1 tablet (25 mcg total) by mouth daily., Disp: 100 tablet, Rfl: 11  ???  diclofenac sodium (VOLTAREN) 1 % gel, Apply 2 grams topically Three (3) times a day., Disp: 180 g, Rfl: 11  ???  empty container (SHARPS-A-GATOR DISPOSAL SYSTEM) Misc, Use as directed for sharps disposal, Disp: 1 each, Rfl: 2  ???  ezetimibe (ZETIA) 10 mg tablet, TAKE 1 TABLET BY MOUTH ONCE DAILY, Disp: 90 each, Rfl: 3  ???  FLUoxetine (PROZAC) 40 MG capsule, Take 2 capsules (80 mg total) by mouth daily., Disp: 180 capsule, Rfl: 0  ???  gabapentin (NEURONTIN) 300 MG capsule, Take 1 capsule (300 mg total) by mouth two (2) times a day., Disp: 180 capsule, Rfl: 3  ???  insulin ASPART (NOVOLOG FLEXPEN U-100 INSULIN) 100 unit/mL (3 mL) injection pen, Inject 15 Units under the skin Three (3) times a day before meals., Disp: 45 mL, Rfl: 3  ???  insulin detemir U-100 (LEVEMIR FLEXTOUCH U-100 INSULN) 100 unit/mL (3 mL) injection pen, Inject 37 units total under the skin nightly, Disp: 15 mL, Rfl: 3  ???  insulin syringe-needle U-100 0.3 mL 31 gauge x 5/16 Syrg, Use to inject insulin twice daily with meals, Disp: 60 each, Rfl: 0  ???  isosorbide mononitrate (IMDUR) 60 MG 24 hr tablet, Take 1 tablet (60 mg total) by mouth daily. DO NOT TAKE on dialysis days, Disp: 90 tablet, Rfl: 3  ???  lancets Misc, Use as directed to check blood glucose Four (4) times a day (before meals and nightly)., Disp: 300 each,  Rfl: 3  ???  lancets Misc, Disp. lancets #200 or amount allowed, Testing QID, Dx: E11.65, Z79.4. (DM type 2 , Insulin Dep), Disp: 200 each, Rfl: 11  ???  loperamide (IMODIUM) 2 mg capsule, Take 1 capsule twice a day and another capsule after each episode of diarrhea, up to 8 pills per day., Disp: 240 capsule, Rfl: 2  ???  metoprolol succinate (TOPROL XL) 100 MG 24 hr tablet, Take 1 tablet (100 mg total) by mouth daily., Disp: 30 tablet, Rfl: 1  ???  miscellaneous medical supply (BLOOD PRESSURE CUFF) Misc, Measure BP once a day or if any symptoms, Disp: 1 each, Rfl: 0  ???  nitroglycerin (NITROLINGUAL) 0.4 mg/dose spray, Place 1 spray under the tongue every five (5) minutes as needed for chest pain. pT MUST HAVE SPRAY DUE TO DRY MOUTH AND PILLS DO NOT DISSOLVE, Disp: 12 g, Rfl: 12  ???  omeprazole (PRILOSEC) 40 MG capsule, Take 1 capsule (40 mg total) by mouth Two (2) times a day (30 minutes before a meal)., Disp: 180 capsule, Rfl: 3  ???  ondansetron (ZOFRAN) 4 MG tablet, Take 1 tablet (4 mg total) by mouth every twelve (12) hours as needed., Disp: 15 tablet, Rfl: 0  ???  semaglutide (OZEMPIC) 0.25 mg or 0.5 mg(2 mg/1.5 mL) PnIj injection, Inject 0.5 mg under the skin every seven (7) days., Disp: 4.5 mL, Rfl: 3  ???  sevelamer (RENVELA) 800 mg tablet, TAKE 2 TABLETS (1600MG ) BY MOUTH THREE TIMES DAILY WITH MEALS, Disp: 540 tablet, Rfl: 3  ???  ticagrelor (BRILINTA) 90 mg Tab, TAKE 1 TABLET BY MOUTH TWICE DAILY, Disp: 180 tablet, Rfl: 0  ???  UNIFINE PENTIPS 31 gauge x 5/16 (8 mm) Ndle, USE WITH INSULIN AND VICTOZA 5 TIMES DAILY, Disp: 400 each, Rfl: 3    Allergies  Losartan, Morphine, and Opioids - morphine analogues    Family History   Problem Relation Age of Onset   ??? Diabetes Mother    ??? Hyperlipidemia Mother    ??? Heart disease Mother    ??? Basal cell carcinoma Mother    ??? Blindness Mother         diabeties   ??? Obesity Mother    ??? Hyperlipidemia Father    ??? Heart disease Father    ??? Lung disease Father    ??? Heart disease Half-Sister    ??? Kidney disease Half-Sister    ??? Gallbladder disease Half-Brother    ??? Obesity Half-Brother    ??? No Known Problems Daughter    ??? No Known Problems Son    ??? Obesity Daughter    ??? Depression Maternal Uncle         Died by suicide in 10-04-17 by gunshot   ??? Melanoma Neg Hx    ??? Squamous cell carcinoma Neg Hx    ??? Macular degeneration Neg Hx    ??? Glaucoma Neg Hx        Social History  Social History     Tobacco Use   ??? Smoking status: Never Smoker   ??? Smokeless tobacco: Current User     Types: Chew   ??? Tobacco comment: 08/15/19:  Dips 1x a day./ /Pt in process of reducing tobacco use,  dips 1 can every 3 weeks,   Vaping Use   ??? Vaping Use: Never used   Substance Use Topics   ??? Alcohol use: Yes     Alcohol/week: 0.0 standard drinks     Comment: rare use:  couple times a year, small amount   ??? Drug use: Never       Review of Systems    Constitutional: Negative for fever.  Eyes: Negative for visual changes.  ENT: Negative for sore throat.  Cardiovascular: Positive for chest pain and palpitations.  Respiratory: Positive for mild shortness of breath.  Gastrointestinal: Negative for abdominal pain, vomiting or diarrhea.  Genitourinary: Negative for dysuria.   Musculoskeletal: Negative for back pain.  Skin: Negative for rash.  Positive for diaphoresis  Neurological: Negative for headaches, focal weakness or numbness.    All other systems have been reviewed and are negative except as otherwise documented.     Physical Exam   ???     ED Triage Vitals   Enc Vitals Group      BP       Pulse       SpO2 Pulse       Resp       Temp       Temp src       SpO2       Weight       Height       Head Circumference       Peak Flow       Pain Score       Pain Loc       Pain Edu?       Excl. in GC?        Constitutional: Alert and oriented.  Ill appearing and in moderate distress.  Eyes: Conjunctivae are normal.  ENT       Head: Normocephalic and atraumatic.       Nose: No congestion.       Mouth/Throat: Mucous membranes are moist.       Neck: No stridor.  Cardiovascular: Tachycardic, regular rhythm. Normal and symmetric distal pulses are present in all extremities.   Respiratory: Normal respiratory effort. Breath sounds are normal in all lobes.  Gastrointestinal: Soft and nontender. There is no CVA tenderness.  Musculoskeletal: Normal range of motion in all extremities.       Right lower leg: No tenderness or edema.       Left lower leg: No tenderness or edema.  Neurologic: Normal speech and language. No gross focal neurologic deficits are appreciated.  Skin: Skin is warm, dry and intact. No rash noted.  Psychiatric: Mood and affect are normal. Speech and behavior are normal.       Radiology     ED POCUS Chest Bilateral   Final Result      XR Chest 2 views   Final Result      Mild pulmonary vascular congestion and interstitial pulmonary edema. Superimposed infection cannot be excluded.           Luther Redo, MD  02/23/20 2102

## 2020-02-24 NOTE — Unmapped (Signed)
Cardiology - Team 1 [facility]) Progress Note    Assessment & Plan:   Jeremy Oliver is a 49 y.o. male with a PMHx of ESRD on dialysis ([name]), CAD??s/p??multiple PCIs (last 12/2019 on Brillinta/Eliquis),??hx of VT after STEMI in 2018 s/p PM/ICD,??T2DM, HTN, HLD, HFrEF (EF 35-40%),??TIA, A-fib??(on apixaban), OSA on CPAP, and bilateral DVTs??who presents for evaluation of chest pain and noted to be in A. fib with RVR with NSTEMI.     Principal Problem:    NSTEMI (non-ST elevation myocardial infarction) (CMS-[name])  Active Problems:    Hypercholesteremia    Diabetes mellitus with coincident hypertension (CMS-[name])    Obesity, Class III, BMI 40-49.9 (morbid obesity) (CMS-[name])    GERD (gastroesophageal reflux disease)    Paroxysmal atrial fibrillation (CMS-[name])    OSA (obstructive sleep apnea)    Tobacco use disorder    HFrEF (heart failure with reduced ejection fraction) (CMS-[name])    ESRD needing dialysis (CMS-[name])  Resolved Problems:    * No resolved hospital problems. *      Chest Pain - NSTEMI - Multivessel CAD s/p multiple PCI ??  Current chest pain likely secondary to demand in the setting of A. fib with RVR and severe CAD (see cardiac history below) versus thrombotic event, which would be less likely on his Brilinta and Eliquis. EKG showing ST depressions in V2???V6 which largely resolved with rate control.  Troponin 140>>422>>3593.  Echo obtained 11/26 shows EF 20???25% (decreased from 35-40% on 10/28), grade III diastolic dysfunction, apical anterior wall, apical, mid anterior wall hypokinesis, RV with severely reduced systolic.  Continue to have discussion of need for LHC as it is unclear how much benefit he will receive from procedure.  - Trend troponin  - Heparin drip (in place of apixaban)  - Continue home Brilinta  - Imdur 60 mg daily  - Statin, Zetia  - NPO midnight for possible LHC    Paroxysmal a-fib  Longstanding history on amiodarone and apixaban at home. ??Was in A. fib with RVR on presentation, now being paced.  -Home metoprolol 100 mg daily and amiodarone 200 mg daily  -Holding apixaban as above while on heparin drip  ??  HFrEF - HTN  Echo obtained 11/26 shows EF 20???25% (decreased from 35-40% on 10/28), grade III diastolic dysfunction, apical anterior wall, apical, mid anterior wall hypokinesis, RV with severely reduced systolic   Makes pretty good urine even with ESRD.  -Bumetanide 2 mg twice daily on nondialysis days  - metoprolol 100 mg daily  ??  ESRD on iHD [name]  -Nephrology consult for routine HD  -Per nephrology will do dialysis tomorrow  -Continue sevelamer 800 mg 3 times daily AC  ??  Normocytic anemia  At baseline currently; consistent with anemia of chronic renal disease.  ??  T2DM: Home regimen of reported to be aspart 15 units with meals and Levemir 25 units nightly  -Lantus 20 units  -Lispro 12 units with meals  -SSI    Leg Pain  Patient states he had a fall 3 days ago and has had left-sided pain including severe left thigh pain when ambulating.  Patient is tender to palpation but no significant swelling noted.  Hemoglobin is improved from 1 month ago with low suspicion for hematoma.  [place] also consider DVT although patient has been compliant with anticoagulation.  -H&H every 12 hours  -CTM    Daily Checklist:  Diet: Regular Diet  DVT PPx: Patient Already on Full Anticoagulation with Heparin gtt  Electrolytes: Replete Potassium  to >/=4 and Magnesium to >/=2  Code Status: Full Code  Dispo: Continue floor care    Team Contact Information:   Primary Team: Cardiology - Team 1 (MEDC1)  Primary Resident: Genia Plants, MD  Resident's Pager: [phone] (Cardiology Team 1 Intern)    Interval History:     Patient states he is feeling occasional chest pain with exertion but not as significant as prior.  Echocardiogram obtained today shows reduced EF from prior echo 1 month ago.  We will continue to trend troponin with consideration of further ischemic evaluation.  Notes a fall 3 days ago with severe left leg pain with no swelling or bruising noted.  Hemoglobin remained stable.    ROS: Denies headache, shortness of breath, abdominal pain, nausea, vomiting.    Objective:   Temp:  [36.1 ??C-36.2 ??C] 36.1 ??C  Heart Rate:  [69-139] 70  Resp:  [14-28] 20  BP: (125-189)/(71-108) 152/93  SpO2:  [95 %-100 %] 100 %    Gen: Obese pleasant gentleman in NAD, answers questions appropriately  Eyes: sclera anicteric, EOMI  HENT: atraumatic, MMM, OP w/o erythema or exudate   Heart: RRR, S1, S2, no M/R/G, no chest wall tenderness  Lungs: CTAB, no crackles or wheezes, no use of accessory muscles  Abdomen: Normoactive bowel sounds, soft, NTND, no rebound/guarding  Extremities: no clubbing, cyanosis, or edema in the BLEs.  Severe tenderness to palpation of left upper thigh.  No swelling or bruising noted.  Psych: Alert, oriented, appropriate mood and affect    Labs/Studies: Labs and Studies from the last 24hrs per EMR and Reviewed

## 2020-02-24 NOTE — Unmapped (Signed)
Cardiology - Team 1 [facility]) History & Physical    Assessment & Plan:   Jeremy Oliver is a 49 y.o. male with PMHx of of ESRD on dialysis ([name]), CAD??s/p??multiple PCIs (last 12/2019 on Brillinta/Eliquis),??hx of VT after STEMI in 2018 s/p PM/ICD,??T2DM, HTN, HLD, HFrEF (EF 35-40%),??TIA, A-fib??(on apixaban), OSA on CPAP, and bilateral DVTs??who presents for evaluation of chest pain and noted to be in A. fib with RVR with NSTEMI.     Principal Problem:    NSTEMI (non-ST elevation myocardial infarction) (CMS-[name])  Active Problems:    Hypercholesteremia    Diabetes mellitus with coincident hypertension (CMS-[name])    Obesity, Class III, BMI 40-49.9 (morbid obesity) (CMS-[name])    GERD (gastroesophageal reflux disease)    Paroxysmal atrial fibrillation (CMS-[name])    OSA (obstructive sleep apnea)    Tobacco use disorder    HFrEF (heart failure with reduced ejection fraction) (CMS-[name])    ESRD needing dialysis (CMS-[name])  Resolved Problems:    * No resolved hospital problems. *      Chest Pain - NSTEMI - Multivessel CAD s/p multiple PCI ??  Current chest pain likely secondary to demand in the setting of A. fib with RVR and severe CAD (see cardiac history below) versus thrombotic event, which would be less likely on his Brilinta and Eliquis. EKG showing ST depressions in V2???V6 which largely resolved with rate control.  Troponin 140>>422>>3593.  Any PCI for this gentleman would certainly be very complex at this point, and the patient would benefit from timing of cath with dialysis given he still makes a significant amount of urine.  -Trend troponin  -Limited echo  -Heparin drip (in place of apixaban)  -Continue home Brilinta  -N.p.o. in case of procedure  -On Imdur 60 mg daily  -Statin, Zetia    Coronary history: S/p??STEMI in 2018 with VT arrest leading to ICD placement and DES to LAD. ??NSTEMI in 02/2018 with DES x2 to mid RCA and RPLA/RPDA bifurcation. ??NSTEMI in 05/2018 with PCI to proximal/mid LAD. ??NSTEMI in 09/2018 with PCI to distal RCA. ??On 07/2019 had DES x2 to the LAD and left circumflex as well as balloon angioplasty of diagonal 2. ??S/p LHC at [facility] on 01/25/2020 noting severe multivessel disease without intervention given high risk. Transferred to [facility] for consideration of high risk PCI.  On 10/29, patient underwent PCI to the LMCA/LAD with 3.0 x 16 mm Synergy DES.   ??  Paroxysmal a-fib  Longstanding history on amiodarone and apixaban at home. ??Was in A. fib with RVR on presentation.  -Home metoprolol 100 mg daily and amiodarone 200 mg daily  -Holding apixaban as above while on heparin drip  -Consider pacemaker interrogation in the morning  ??  HFrEF - HTN  Appears well compensated at this time.??Echo from 10/28 showing LVEF 35-40%.?? Makes pretty good urine even with ESRD.  -Bumetanide 2 mg twice daily on nondialysis days  -BB as above    ESRD on iHD [name]  -Nephrology consult for routine HD  -Continue sevelamer 800 mg 3 times daily AC    Normocytic anemia  At baseline currently; consistent with anemia of chronic renal disease.  ??  T2DM: Home regimen of reported to be aspart 15 units with meals and Levemir 25 units nightly  -Lantus 20 units  -Lispro 12 units with meals  -SSI      Daily Checklist:  Diet: NPO  DVT PPx: Patient Already on Full Anticoagulation with Heparin drip  Electrolytes: Replete Potassium to >/=4 and  Magnesium to >/=2  Code Status: Full Code    Chief Concern:   NSTEMI (non-ST elevation myocardial infarction) (CMS-[name])    Subjective:   HPI:  Jeremy Route Petron is a 49 y.o. male with PMHx of ESRD on dialysis ([name]), CAD??s/p??multiple PCIs (last 12/2019 on Brillinta/Eliquis),??hx of VT after STEMI in 2018 s/p PM/ICD,??T2DM, HTN, HLD, HFrEF (EF 35-40%),??TIA, A-fib??(on apixaban), OSA on CPAP, and bilateral DVTs??who presents for evaluation of chest pain.    Patient reports that on 11/25 at around 3:30???4 PM that his A. fib acted up as manifested by palpitations accompanied by severe central chest pain and pressure with radiation to his neck, his chest jumping, shortness of breath, nausea, lightheadedness, and diaphoresis.  He took his nitroglycerin spray without relief so he quickly took his meds (Brilinta, Eliquis, metoprolol) which he had forgotten to take in the morning due to working on things for Thanksgiving.    He reports his chest pain finally calm down about 6 PM.  Between 4 and 6 PM, he feels like his heart rate did slow down again but believes that caused his pacemaker to kick in, sending him back in the A. fib and making him feel nauseous again.    He otherwise denies any orthopnea or lower extremity edema.  He denies any fevers, chills, or other infectious symptoms.    He reports at baseline he is short of breath after walking to the bathroom.  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    He was recently discharged on 01/28/2020 after suffering an NSTEMI.  He underwent PCI on 01/27/2020 to the LMCA/LAD (CAD including 80% LMCA, 90% LAD stenosis).  Has had previous PCI to LAD and LCx 08/19/2019, PCI to proximal to mid LAD 05/12/2018, PCI to mid RCA and or PLA/R DPA bifurcation 03/12/2018.  Most recent echocardiogram on file 01/26/2020 revealed LVEF 35 to 40%.     He otherwise went to HD last on Wednesday where he states they were try to remove 3 L and start her usual 1.5-2 L.    He currently uses tobacco (dip) going through 1 can about every 2 weeks, less than prior.  He denies alcohol or drug use.    In the ED, patient in A. fib with RVR (at least 130s) with hypertension.  EKG showing ST depressions in V2???V6 which largely resolved with rate control.  Troponin 140>>422>>3593.      Designated Environmental health practitioner:  Mr. Mcswain currently has decisional capacity for healthcare decision-making and is able to designate a surrogate Environmental health practitioner. [name] designated healthcare decision maker(s) is/are [name] Dalzell (the patient's spouse) as denoted by stated patient preference.    Allergies:  Losartan, Morphine, and Opioids - morphine analogues    Medications:   Prior to Admission medications    Medication Dose, Route, Frequency   acetaminophen (TYLENOL) 500 MG tablet TAKE 2 TABLETS (1000MG ) BY MOUTH EVERY 8 HOURS AS NEEDED FOR PAIN   albuterol HFA 90 mcg/actuation inhaler Inhale 2 puffs into the lungs every 6 (six) hours as needed for wheezing or shortness of breath.   alirocumab (PRALUENT) 150 mg/mL subcutaneous injection Inject the contents of 1 pen (150 mg total) under the skin every fourteen (14) days.   allopurinoL (ZYLOPRIM) 100 MG tablet 200 mg, Oral, Daily (standard)   amiodarone (PACERONE) 200 MG tablet TAKE 1 TABLET BY MOUTH ONCE DAILY   apixaban (  ELIQUIS) 5 mg Tab 5 mg, Oral, 2 times a day (standard)   atorvastatin (LIPITOR) 80 MG tablet 80 mg, Oral, Daily (standard)   blood sugar diagnostic Strp Use as directed to check blood glucose Four (4) times a day (before meals and nightly).   blood-glucose meter kit Disp. blood glucose meter kit preferred by patient's insurance. Dx: Diabetes, E11.9   bumetanide (BUMEX) 2 MG tablet 2 mg, Oral, 2 times a day, On non dialysis days   cholecalciferol, vitamin D3 25 mcg, 1,000 units,, 1,000 unit (25 mcg) tablet 25 mcg, Oral, Daily (standard)   diclofenac sodium (VOLTAREN) 1 % gel Apply 2 grams topically Three (3) times a day.   empty container (SHARPS-A-GATOR DISPOSAL SYSTEM) Misc Use as directed for sharps disposal   ezetimibe (ZETIA) 10 mg tablet TAKE 1 TABLET BY MOUTH ONCE DAILY   FLUoxetine (PROZAC) 40 MG capsule 80 mg, Oral, Daily (standard)   gabapentin (NEURONTIN) 300 MG capsule 300 mg, Oral, 2 times a day   insulin ASPART (NOVOLOG FLEXPEN U-100 INSULIN) 100 unit/mL (3 mL) injection pen Inject 15 Units under the skin Three (3) times a day before meals.   insulin detemir U-100 (LEVEMIR FLEXTOUCH U-100 INSULN) 100 unit/mL (3 mL) injection pen Inject 37 units total under the skin nightly insulin syringe-needle U-100 0.3 mL 31 gauge x 5/16 Syrg Use to inject insulin twice daily with meals   isosorbide mononitrate (IMDUR) 60 MG 24 hr tablet 60 mg, Oral, Daily (standard), DO NOT TAKE on dialysis days   lancets Misc Use as directed to check blood glucose Four (4) times a day (before meals and nightly).   lancets Misc Disp. lancets #200 or amount allowed, Testing QID, Dx: E11.65, Z79.4. (DM type 2 , Insulin Dep)   loperamide (IMODIUM) 2 mg capsule Take 1 capsule twice a day and another capsule after each episode of diarrhea, up to 8 pills per day.   metoprolol succinate (TOPROL XL) 100 MG 24 hr tablet 100 mg, Oral, Daily (standard)   miscellaneous medical supply (BLOOD PRESSURE CUFF) Misc Measure BP once a day or if any symptoms   nitroglycerin (NITROLINGUAL) 0.4 mg/dose spray 1 spray, Sublingual, Every 5 min PRN, pT MUST HAVE SPRAY DUE TO DRY MOUTH AND PILLS DO NOT DISSOLVE   omeprazole (PRILOSEC) 40 MG capsule 40 mg, Oral, 2 times a day (AC)   ondansetron (ZOFRAN) 4 MG tablet 4 mg, Oral, Every 12 hours PRN   semaglutide (OZEMPIC) 0.25 mg or 0.5 mg(2 mg/1.5 mL) PnIj injection 0.5 mg, Subcutaneous, Every 7 days   sevelamer (RENVELA) 800 mg tablet TAKE 2 TABLETS (1600MG ) BY MOUTH THREE TIMES DAILY WITH MEALS   ticagrelor (BRILINTA) 90 mg Tab TAKE 1 TABLET BY MOUTH TWICE DAILY   UNIFINE PENTIPS 31 gauge x 5/16 (8 mm) Ndle USE WITH INSULIN AND VICTOZA 5 TIMES DAILY   liraglutide (VICTOZA 2-PAK) 0.6 mg/0.1 mL (18 mg/3 mL) injection 0.6 mg, Subcutaneous, Daily   ranolazine (RANEXA) 500 MG 12 hr tablet Oral, 2 times a day   traZODone (DESYREL) 50 MG tablet 50 mg, Oral, Nightly       Medical History:  Past Medical History:   Diagnosis Date   ??? Angina at rest (CMS-[name])    ??? Arthritis    ??? Asthma    ??? Atrial fibrillation and flutter (CMS-[name]) 06/08/14   ??? BMI 40.0-44.9, adult (CMS-[name]) 10/29/2015   ??? Chronic anticoagulation 02/17/2018   ??? Chronic pain syndrome 04/11/2014    ~Use daily lyrica as  recommended ~Tramadol as needed for pain ~Fluoxetine daily as recommended ~Daily exercise ~Eat a healthy diet ~Good control of your blood sugars can help    ??? Coronary artery disease    ??? Depression    ??? Diabetes mellitus (CMS-[name])    ??? ESRD (end stage renal disease) on dialysis (CMS-[name])    ??? Eye trauma 1998    glass in both eyes and removed   ??? GERD (gastroesophageal reflux disease)    ??? Gout    ??? Homeless    ??? Hyperlipidemia    ??? Hypertension    ??? Illiteracy and low-level literacy    ??? Microscopic hematuria    ??? Migraine    ??? Nephrolithiasis    ??? 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 Nonalcoholic fatty liver disease    ??? NSTEMI (non-ST elevated myocardial infarction) (CMS-[name]) 01/13/2017   ??? Obesity    ??? Obstructive sleep apnea 2009   ??? Panic attacks    ??? 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 Seizure-like activity (CMS-[name])     ~Monitor for symptoms and report them to your primary care provider if they occur    ??? Systolic CHF, chronic (CMS-[name])     EF 40-45% 2017   ??? TIA (transient ischemic attack)        Surgical History:  Past Surgical History:   Procedure Laterality Date   ??? CARDIAC CATHETERIZATION  03/08/2007   ??? CORONARY STENT PLACEMENT     ??? CYSTOURETHROSCOPY  12/31/2009     Microscopic hematuria   ??? KNEE SURGERY Right 09/23/2006    arthroscopic knee surgery medial and lateral meniscus tears as well as crystal disease   ??? PR CATH PLACE/CORON ANGIO, IMG SUPER/INTERP,R&L HRT CATH, L HRT VENTRIC N/A 02/13/2017    Procedure: Left/Right Heart Catheterization;  Surgeon: Marlaine Hind, MD; Location: [facility] CATH;  Service: Cardiology   ??? PR CATH PLACE/CORON ANGIO, IMG SUPER/INTERP,W LEFT HEART VENTRICULOGRAPHY N/A 03/17/2017    Procedure: Left Heart Catheterization W Intervention;  Surgeon: Marlaine Hind, MD;  Location: [facility] CATH;  Service: Cardiology   ??? PR CATH PLACE/CORON ANGIO, IMG SUPER/INTERP,W LEFT HEART VENTRICULOGRAPHY N/A 05/12/2018    Procedure: CATH LEFT HEART CATHETERIZATION W INTERVENTION;  Surgeon: Dorathy Kinsman, MD;  Location: [facility] CATH;  Service: Cardiology   ??? PR CATH PLACE/CORON ANGIO, IMG SUPER/INTERP,W LEFT HEART VENTRICULOGRAPHY N/A 10/22/2018    Procedure: CATH LEFT HEART CATHETERIZATION W INTERVENTION;  Surgeon: Marlaine Hind, MD;  Location: [facility] CATH;  Service: Cardiology   ??? PR CATH PLACE/CORON ANGIO, IMG SUPER/INTERP,W LEFT HEART VENTRICULOGRAPHY N/A 08/19/2019    Procedure: Left Heart Catheterization W Intervention;  Surgeon: Marlaine Hind, MD;  Location: [facility] CATH;  Service: Cardiology   ??? PR ECMO/ECLS INITIATION VENO-ARTERIAL N/A 03/19/2017    Procedure: ECMO/ECLS; INITIATION, VENO-ARTERIAL;  Surgeon: Lennie Odor, MD;  Location: MAIN OR [facility];  Service: Cardiac Surgery   ??? PR ECMO/ECLS RMVL PRPH CANNULA OPEN 6 YRS & OLDER Left 03/25/2017    Procedure: ECMO / ECLS PROVIDED BY PHYSICIAN; REMOVAL OF PERIPHERAL (ARTERIAL AND/OR  VENOUS) CANNULA(E), OPEN, 6 YEARS AND OLDER;  Surgeon: Alonna Buckler Ikonomidis, MD;  Location: MAIN OR [facility];  Service: Cardiac Surgery   ??? PR EPHYS EVAL W/ ABLATION SUPRAVENT ARRHYTHMIA N/A 07/19/2015    Catheter ablation of cavotricuspid isthmus for atrial flutter [facility])   ??? PR INSER HART PACER XVENOUS ATRIAL N/A 05/30/2015    [employer] dual-chamber pacemaker implant (E.Chung)   ??? PR INSERT/PLACE FLOW DIRECT CATH N/A 03/17/2017    Procedure: Insert Leave In [place];  Surgeon: Marlaine Hind, MD;  Location: [facility] CATH;  Service: Cardiology   ??? PR INSJ/RPLCMT PERM DFB W/TRNSVNS LDS 1/DUAL CHMBR N/A 05/04/2017 Procedure: ICD Implant System (Single/Dual);  Surgeon: Eldred Manges, MD;  Location: [facility] CATH;  Service: Cardiology   ??? PR NEGATIVE PRESSURE WOUND THERAPY DME >50 SQ CM Left 03/25/2017    Procedure: Neg Press Wound Tx (Vac Assist) Incl Topicals, Per Session, Tsa Greater Than/= 50 Cm Squared;  Surgeon: Alonna Buckler Ikonomidis, MD;  Location: MAIN OR [facility];  Service: Cardiac Surgery   ??? PR PRQ TRLUML CORONARY STENT W/ANGIO ONE ART/BRNCH N/A 03/12/2018    Procedure: Percutaneous Coronary Intervention;  Surgeon: Marlaine Hind, MD;  Location: [facility] CATH;  Service: Cardiology   ??? PR PRQ TRLUML CORONARY STENT W/ANGIO ONE ART/BRNCH N/A 01/27/2020    Procedure: Percutaneous Coronary Intervention;  Surgeon: Marlaine Hind, MD;  Location: [facility] CATH;  Service: Cardiology   ??? PR REBL VES DIRECT,LOW EXTREM Left 03/19/2017    Procedure: Repr Bld Vessel Direct; Lower Extrem;  Surgeon: Boykin Reaper, MD;  Location: MAIN OR [facility];  Service: Vascular   ??? PR REMV ART CLOT ILIAC-POP,LEG INCIS Left 03/25/2017    Procedure: EMBOLECTOMY OR THROMBECTOMY, WITH OR WITHOUT CATHETER; FEMOROPOPLITEAL, AORTOILIAC ARTERY, BY LEG INCISION;  Surgeon: Alonna Buckler Ikonomidis, MD;  Location: MAIN OR [facility];  Service: Cardiac Surgery   ??? PR UPPER GI ENDOSCOPY,BIOPSY N/A 02/28/2016    Procedure: UGI ENDOSCOPY; WITH BIOPSY, SINGLE OR MULTIPLE;  Surgeon: Liane Comber, MD;  [facility];  Service: Gastroenterology   ??? PR UPPER GI ENDOSCOPY,DIAGNOSIS N/A 05/07/2016    Procedure: UGI ENDO, INCLUDE ESOPHAGUS, STOMACH, & DUODENUM &/OR JEJUNUM; DX W/WO COLLECTION SPECIMN, BY BRUSH OR WASH;  Surgeon: Modena Nunnery, MD;  [facility]: [facility];  Service: Gastroenterology       Family History: Reviewed  Family History   Problem Relation Age of Onset   ??? Diabetes Mother    ??? Hyperlipidemia Mother    ??? Heart disease Mother    ??? Basal cell carcinoma Mother    ??? Blindness Mother         diabeties ??? Obesity Mother    ??? Hyperlipidemia Father    ??? Heart disease Father    ??? Lung disease Father    ??? Heart disease Half-Sister    ??? Kidney disease Half-Sister    ??? Gallbladder disease Half-Brother    ??? Obesity Half-Brother    ??? No Known Problems Daughter    ??? No Known Problems Son    ??? Obesity Daughter    ??? Depression Maternal Uncle         Died by suicide in October 12, 2017 by gunshot   ??? Melanoma Neg Hx    ??? Squamous cell carcinoma Neg Hx    ??? Macular degeneration Neg Hx    ??? Glaucoma Neg Hx        Social History:  The patient lives with family  Social History     Tobacco Use   ??? Smoking status: Never Smoker   ??? Smokeless tobacco: Current User     Types: Chew   ??? Tobacco comment: 08/15/19:  Dips 1x a day./ /Pt in process of reducing tobacco use,  dips 1 can every 3 weeks,   Vaping Use   ??? Vaping Use: Never used   Substance Use Topics   ??? Alcohol use: Yes     Alcohol/week: 0.0 standard drinks     Comment: rare use: couple times a year, small amount   ??? Drug use: Never        Review of Systems:  10 systems were reviewed and are negative unless otherwise mentioned in the HPI    Objective:   Physical Exam:  Temp:  [36.1 ??C] 36.1 ??C  Heart Rate:  [69-139] 70  Resp:  [14-28] 18  BP: (125-189)/(74-108) 161/87  SpO2:  [95 %-99 %] 97 %    Gen: Obese male in NAD, answers questions appropriately  Eyes: Sclera anicteric, EOMI, PERRLA,  HENT: atraumatic, normocephalic, MMM. OP w/o erythema or exudate   Neck: no cervical lymphadenopathy or thyromegaly, no JVD  Heart: Irregular rhythm, S1, S2, no M/R/G, no chest wall tenderness  Lungs: CTAB, no crackles or wheezes, no use of accessory muscles  Abdomen: Normoactive bowel sounds, soft, protuberant, no rebound/guarding, no hepatosplenomegaly  Extremities: no clubbing, cyanosis, or edema  Neuro: CN II-XI grossly intact, normal cerebellar function, normal gait. No focal deficits.  Skin: Several small 1 cm brown papules on his abdomen and lower extremities  Psych: Alert, oriented, normal mood and affect.     Labs/Studies/Imaging:  Labs, Studies, Imaging from the last 24hrs per EMR and personally reviewed

## 2020-02-24 NOTE — Unmapped (Addendum)
Pt arrived to unit from OSH. Team was paged, tele ordered. Orders for NPO. Pt has PIV in R AC and a hemodialysis catheter to right chest with dressing placed at dialysis center yesterday per pt. Heparin gtt started. Pt complained of SOB when he woke up, placed on 2L for comfort. Team was notified that pt stated he used a bipap at home. Call bell within reach. Will continue to monitor.     Problem: Adult Inpatient Plan of Care  Goal: Plan of Care Review  Flowsheets (Taken 02/24/2020 0029)  Progress: improving  Plan of Care Reviewed With: patient  Goal: Patient-Specific Goal (Individualized)  Flowsheets (Taken 02/24/2020 0029)  Patient-Specific Goals (Include Timeframe): pt will achieve adequate rest during shift  Individualized Care Needs: cluster care, tele, blood glucose monitoring  Anxieties, Fears or Concerns: none expressed  Goal: Optimal Comfort and Wellbeing  Intervention: Monitor Pain and Promote Comfort  Flowsheets (Taken 02/24/2020 0032)  Pain Management Interventions:  ??? care clustered  ??? quiet environment facilitated  Intervention: Provide Person-Centered Care  Flowsheets (Taken 02/24/2020 0032)  Trust Relationship/Rapport:  ??? care explained  ??? questions answered  ??? questions encouraged     Problem: Fall Injury Risk  Goal: Absence of Fall and Fall-Related Injury  Intervention: Identify and Manage Contributors  Flowsheets (Taken 02/24/2020 0032)  Medication Review/Management: medications reviewed

## 2020-02-24 NOTE — Unmapped (Signed)
Pt reports sudden onset of chest pain and SOB @ 1430. Pt use nitro spray with no relief. Pt with tachycardia and tachypnea in triage

## 2020-02-24 NOTE — Unmapped (Signed)
[facility] ESRD Consult Note     Requesting provider: Graciela Husbands  Service requesting consult: cicu  Reason for consult: ESRD, provision of dialysis  Indication for acute dialysis?: No     Outpatient dialysis unit: [place]  Outpatient dialysis schedule: [name]    Assessment/Recommendations: Jeremy Oliver is a/an 49 y.o. male with a past medical history notable for ESRD on HD admitted with NSTEMI.     # ESRD: 4hrs, 2K, 2.5Ca, Na 137, Bicarbonate 35, 36.5C, Dialyzer: 180, Does not receive any heparin .     # Volume/ hypertension: EDW 155kg. Will attempt to achieve dry weight if tolerated.     # Anemia of Chronic Kidney Disease: Hemoglobin 10.2. Currently receiving Venofer 100 mg every treatment last dose 10/25. Mircera 60 mcg every 2 weeks (last dose 10/18).     # Secondary Hyperparathyroidism/Hyperphosphatemia: Ca 7.8. No phos levels available. Takes calcitriol 1 mcg every treatment.     # Vascular access: Right IJ non-tunneled catheter      # Hepatitis status: Hepatitis B surface antigen negative on 01/18/20.     # Additional recommendations:  - Avoid nephrotoxic drugs; dose all meds for creatinine clearance < 10 ml/min   - Unless absolutely necessary, no MRIs with gadolinium.   - Implement save arm precautions.  Prefer needle sticks in the dorsum of the hands or wrists.  No blood pressure measurements in arm.  - If blood transfusion is requested during hemodialysis sessions, please alert [place] prior to the session.   - If a hemodialysis catheter line culture is requested, please alert [place] as only hemodialysis nurses are able to collect those specimens.     Recommendations were discussed with the primary team.    **Please contact [facility] PRIOR to hospital discharge so that the nephrology team can arrange appropriate dialysis-related follow-up.**      History of Present Illness: Jeremy Oliver is a/an 49 y.o. 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     Here with NSTEMI with dispo planning per cardiology given complex anatomy.     Medications:   Current Facility-Administered Medications   Medication Dose Route Frequency Provider Last Rate Last Admin   ??? acetaminophen (TYLENOL) tablet 650 mg  650 mg Oral Q4H PRN Dian Situ, MD       ??? amiodarone (PACERONE) tablet 200 mg  200 mg Oral Daily Dian Situ, MD       ??? aspirin chewable tablet 81 mg  81 mg Oral Daily Collins Scotland, MD       ??? atorvastatin (LIPITOR) tablet 80 mg  80 mg Oral Daily Dian Situ, MD       ??? bumetanide (BUMEX) tablet 2 mg  2 mg Oral BID Dian Situ, MD   2 mg at 02/24/20 0617   ??? dextrose 50 % in water (D50W) 50 % solution 12.5 g  12.5 g Intravenous Q10 Min PRN Dian Situ, MD       ??? ezetimibe (ZETIA) tablet 10 mg  10 mg Oral Nightly Dian Situ, MD   10 mg at 02/23/20 2135   ??? gabapentin (NEURONTIN) capsule 300 mg  300 mg Oral BID Tollie Pizza  Joycelyn Man, MD       ??? heparin (porcine) 1000 unit/mL injection 2,000 Units  2,000 Units Intravenous Q6H PRN Dian Situ, MD       ??? heparin 25,000 Units/250 mL (100 units/mL) in 0.45% saline infusion (premade)  12 Units/kg/hr Intravenous Continuous Dian Situ, MD 19.26 mL/hr at 02/24/20 0151 12 Units/kg/hr at 02/24/20 0151   ??? insulin glargine (LANTUS) injection 20 Units  20 Units Subcutaneous Nightly Dian Situ, MD   20 Units at 02/24/20 0151   ??? insulin lispro (HumaLOG) injection 0-12 Units  0-12 Units Subcutaneous ACHS Dian Situ, MD       ??? insulin lispro (HumaLOG) injection 12 Units  12 Units Subcutaneous TID AC Dian Situ, MD       ??? isosorbide mononitrate (IMDUR) 24 hr tablet 60 mg  60 mg Oral Daily Dian Situ, MD   60 mg at 02/23/20 2135   ??? metoprolol succinate (TOPROL-XL) 24 hr tablet 100 mg  100 mg Oral Daily Dian Situ, MD   100 mg at 02/23/20 2135   ??? pantoprazole (PROTONIX) EC tablet 40 mg  40 mg Oral Daily Dian Situ, MD   40 mg at 02/23/20 2135   ??? sevelamer (RENVELA) tablet 800 mg  800 mg Oral 3xd Meals Dian Situ, MD   800 mg at 02/23/20 2135   ??? ticagrelor (BRILINTA) tablet 90 mg  90 mg Oral BID Dian Situ, MD   90 mg at 02/23/20 2139        ALLERGIES  Losartan, Morphine, and Opioids - morphine analogues    MEDICAL HISTORY  Past Medical History:   Diagnosis Date   ??? Angina at rest (CMS-[name])    ??? Arthritis    ??? Asthma    ??? Atrial fibrillation and flutter (CMS-[name]) 06/08/14   ??? BMI 40.0-44.9, adult (CMS-[name]) 10/29/2015   ??? Chronic anticoagulation 02/17/2018   ??? Chronic pain syndrome 04/11/2014    ~Use daily lyrica as recommended ~Tramadol as needed for pain ~Fluoxetine daily as recommended ~Daily exercise ~Eat a healthy diet ~Good control of your blood sugars can help    ??? Coronary artery disease    ??? Depression    ??? Diabetes mellitus (CMS-[name])    ??? ESRD (end stage renal disease) on dialysis (CMS-[name])    ??? Eye trauma 1998    glass in both eyes and removed   ??? GERD (gastroesophageal reflux disease)    ??? Gout    ??? Homeless    ??? Hyperlipidemia    ??? Hypertension    ??? Illiteracy and low-level literacy    ??? Microscopic hematuria    ??? Migraine    ??? Nephrolithiasis    ??? 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 Nonalcoholic fatty liver disease    ??? NSTEMI (non-ST elevated myocardial infarction) (CMS-[name]) 01/13/2017   ??? Obesity    ??? Obstructive sleep apnea 2009   ??? Panic attacks    ??? 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 Seizure-like activity (CMS-[name])     ~Monitor  for symptoms and report them to your primary care provider if they occur    ??? Systolic CHF, chronic (CMS-[name])     EF 40-45% 2017   ??? TIA (transient ischemic attack)         SOCIAL HISTORY  Social History     Socioeconomic History   ??? Marital status: Married     Spouse name: [name]   ??? Number of children: 3   ??? Years of education: 32   ??? Highest education level: Not on file   Occupational History   ??? Occupation: DISABLED     Comment: Former IT sales professional   Tobacco Use   ??? Smoking status: Never Smoker   ??? Smokeless tobacco: Current User     Types: Chew   ??? Tobacco comment: 08/15/19:  Dips 1x a day./ /Pt in process of reducing tobacco use,  dips 1 can every 3 weeks,   Vaping Use   ??? Vaping Use: Never used   Substance and Sexual Activity   ??? Alcohol use: Yes     Alcohol/week: 0.0 standard drinks     Comment: rare use: couple times a year, small amount   ??? Drug use: Never   ??? Sexual activity: Not Currently     Partners: Female   Other Topics Concern   ??? Do you use sunscreen? No   ??? Tanning bed use? No   ??? Are you easily burned? No   ??? Excessive sun exposure? No   ??? Blistering sunburns? No   Social History Narrative    Information updated:  01/08/16. Reviewed last: 12/01/18, 08/15/19        Born in [place]    Raised with both parents    Parents died w/in 6 mos of each other. His parents knew they were going to pass and told him just before.    Half sister and 2 half brothers        Married 31 yrs. Wife [name]    2 other children son and daughter, adopted out    Daughter Bridgette, bf DJ        Education - completed through [employer]. 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    Former Presenter, broadcasting since age 52    Lives with wife, difficulty paying bills 12/30/17 Lives next door to daughter, husband and their grandson        05/19/18: pt moving to new home in next 5 days- after an eviction. Has Medicaid assistance again. Will be w/ wife [name], they babysit their grandson often.    06/14/18: Pt and husband living with daughter while new location to live is determined.    12/01/18: pt living with wife and friends. Friends have two young children. [name] states soon they will have their own place, however doesn't know when. Pt states it will be after the neighbors are evicted, we can move in. Pt will need to start dialysis soon. Pt and wife Not talking with daughter and grandson lately.      Social Determinants of Health     Financial Resource Strain: Low Risk    ??? Difficulty of Paying Living Expenses: Not very hard   Food Insecurity: Food Insecurity Present   ??? Worried About Running Out of Food in the Last Year: Sometimes true   ???  Ran Out of Food in the Last Year: Sometimes true   Transportation Needs: No Transportation Needs   ??? Lack of Transportation (Medical): No   ??? Lack of Transportation (Non-Medical): No   Physical Activity: Not on file   Stress: Stress Concern Present   ??? Feeling of Stress : Rather much   Social Connections: Moderately Isolated   ??? Frequency of Communication with Friends and Family: More than three times a week   ??? Frequency of Social Gatherings with Friends and Family: Twice a week   ??? Attends Religious Services: Never   ??? Active Member of Clubs or Organizations: No   ??? Attends Banker Meetings: Never   ??? Marital Status: Married        FAMILY HISTORY  Family History   Problem Relation Age of Onset   ??? Diabetes Mother    ??? Hyperlipidemia Mother    ??? Heart disease Mother    ??? Basal cell carcinoma Mother    ??? Blindness Mother         diabeties   ??? Obesity Mother    ??? Hyperlipidemia Father    ??? Heart disease Father    ??? Lung disease Father    ??? Heart disease Half-Sister    ??? Kidney disease Half-Sister    ??? Gallbladder disease Half-Brother ??? Obesity Half-Brother    ??? No Known Problems Daughter    ??? No Known Problems Son    ??? Obesity Daughter    ??? Depression Maternal Uncle         Died by suicide in 2017/10/17 by gunshot   ??? Melanoma Neg Hx    ??? Squamous cell carcinoma Neg Hx    ??? Macular degeneration Neg Hx    ??? Glaucoma Neg Hx         Review of Systems:  A 12 system review of systems was negative except as noted in HPI.    Physical Exam:  Vitals:    02/24/20 0250   BP: 144/71   Pulse: 70   Resp: 20   Temp: 36.2 ??C   SpO2: 99%     I/O this shift:  In: -   Out: 300 [Urine:300]    Intake/Output Summary (Last 24 hours) at 02/24/2020 0936  Last data filed at 02/24/2020 3664  Gross per 24 hour   Intake ???   Output 1300 ml   Net -1300 ml     General: well-appearing, no acute distress  HEENT: anicteric sclera  CV: RRR, no m/r/g, no edema  Lungs: CTAB, normal wob  Abd: soft, non-tender, non-distended  Skin: no visible lesions or rashes  Psych: alert, engaged, appropriate mood and affect  Neuro: normal speech, no gross focal deficits     Test Results  Reviewed  Lab Results   Component Value Date    NA 138 02/24/2020    K 3.6 02/24/2020    CL 102 02/24/2020    CO2 27.0 02/24/2020    BUN 28 (H) 02/24/2020    CREATININE 5.35 (H) 02/24/2020    GFR >= 60 01/09/2012    GLU 139 02/24/2020    CALCIUM 7.8 (L) 02/24/2020    ALBUMIN 2.8 (L) 02/23/2020    PHOS 5.3 (H) 02/24/2020       I have reviewed relevant outside healthcare records

## 2020-02-25 DIAGNOSIS — Z955 Presence of coronary angioplasty implant and graft: Principal | ICD-10-CM

## 2020-02-25 DIAGNOSIS — F4322 Adjustment disorder with anxiety: Principal | ICD-10-CM

## 2020-02-25 DIAGNOSIS — I480 Paroxysmal atrial fibrillation: Principal | ICD-10-CM

## 2020-02-25 DIAGNOSIS — R079 Chest pain, unspecified: Principal | ICD-10-CM

## 2020-02-25 DIAGNOSIS — F3342 Major depressive disorder, recurrent, in full remission: Principal | ICD-10-CM

## 2020-02-25 DIAGNOSIS — Z7901 Long term (current) use of anticoagulants: Principal | ICD-10-CM

## 2020-02-25 DIAGNOSIS — Z86718 Personal history of other venous thrombosis and embolism: Principal | ICD-10-CM

## 2020-02-25 DIAGNOSIS — E7800 Pure hypercholesterolemia, unspecified: Principal | ICD-10-CM

## 2020-02-25 DIAGNOSIS — I6602 Occlusion and stenosis of left middle cerebral artery: Principal | ICD-10-CM

## 2020-02-25 DIAGNOSIS — Z791 Long term (current) use of non-steroidal anti-inflammatories (NSAID): Principal | ICD-10-CM

## 2020-02-25 DIAGNOSIS — Z20822 Contact with and (suspected) exposure to covid-19: Principal | ICD-10-CM

## 2020-02-25 DIAGNOSIS — Z992 Dependence on renal dialysis: Principal | ICD-10-CM

## 2020-02-25 DIAGNOSIS — F17220 Nicotine dependence, chewing tobacco, uncomplicated: Principal | ICD-10-CM

## 2020-02-25 DIAGNOSIS — I5022 Chronic systolic (congestive) heart failure: Principal | ICD-10-CM

## 2020-02-25 DIAGNOSIS — E1122 Type 2 diabetes mellitus with diabetic chronic kidney disease: Principal | ICD-10-CM

## 2020-02-25 DIAGNOSIS — G894 Chronic pain syndrome: Principal | ICD-10-CM

## 2020-02-25 DIAGNOSIS — D631 Anemia in chronic kidney disease: Principal | ICD-10-CM

## 2020-02-25 DIAGNOSIS — K219 Gastro-esophageal reflux disease without esophagitis: Principal | ICD-10-CM

## 2020-02-25 DIAGNOSIS — I132 Hypertensive heart and chronic kidney disease with heart failure and with stage 5 chronic kidney disease, or end stage renal disease: Principal | ICD-10-CM

## 2020-02-25 DIAGNOSIS — N2581 Secondary hyperparathyroidism of renal origin: Principal | ICD-10-CM

## 2020-02-25 DIAGNOSIS — F410 Panic disorder [episodic paroxysmal anxiety] without agoraphobia: Principal | ICD-10-CM

## 2020-02-25 DIAGNOSIS — Z8673 Personal history of transient ischemic attack (TIA), and cerebral infarction without residual deficits: Principal | ICD-10-CM

## 2020-02-25 DIAGNOSIS — Z794 Long term (current) use of insulin: Principal | ICD-10-CM

## 2020-02-25 DIAGNOSIS — G4733 Obstructive sleep apnea (adult) (pediatric): Principal | ICD-10-CM

## 2020-02-25 DIAGNOSIS — E872 Acidosis: Principal | ICD-10-CM

## 2020-02-25 DIAGNOSIS — E785 Hyperlipidemia, unspecified: Principal | ICD-10-CM

## 2020-02-25 DIAGNOSIS — I25119 Atherosclerotic heart disease of native coronary artery with unspecified angina pectoris: Principal | ICD-10-CM

## 2020-02-25 DIAGNOSIS — I252 Old myocardial infarction: Principal | ICD-10-CM

## 2020-02-25 DIAGNOSIS — M109 Gout, unspecified: Principal | ICD-10-CM

## 2020-02-25 DIAGNOSIS — N186 End stage renal disease: Principal | ICD-10-CM

## 2020-02-25 DIAGNOSIS — E875 Hyperkalemia: Principal | ICD-10-CM

## 2020-02-25 DIAGNOSIS — Z950 Presence of cardiac pacemaker: Principal | ICD-10-CM

## 2020-02-25 DIAGNOSIS — I214 Non-ST elevation (NSTEMI) myocardial infarction: Principal | ICD-10-CM

## 2020-02-25 LAB — CBC
HEMATOCRIT: 34 % — ABNORMAL LOW (ref 41.0–53.0)
HEMOGLOBIN: 10.6 g/dL — ABNORMAL LOW (ref 13.5–17.5)
MEAN CORPUSCULAR HEMOGLOBIN CONC: 31.1 g/dL (ref 31.0–37.0)
MEAN CORPUSCULAR HEMOGLOBIN: 28.2 pg (ref 26.0–34.0)
MEAN CORPUSCULAR VOLUME: 90.6 fL (ref 80.0–100.0)
MEAN PLATELET VOLUME: 9 fL (ref 7.0–10.0)
PLATELET COUNT: 224 10*9/L (ref 150–440)
RED BLOOD CELL COUNT: 3.76 10*12/L — ABNORMAL LOW (ref 4.50–5.90)
RED CELL DISTRIBUTION WIDTH: 16.3 % — ABNORMAL HIGH (ref 12.0–15.0)
WBC ADJUSTED: 8.4 10*9/L (ref 4.5–11.0)

## 2020-02-25 LAB — BASIC METABOLIC PANEL
ANION GAP: 9 mmol/L (ref 5–14)
ANION GAP: 8 mmol/L (ref 5–14)
BLOOD UREA NITROGEN: 34 mg/dL — ABNORMAL HIGH (ref 9–23)
BLOOD UREA NITROGEN: 30 mg/dL — ABNORMAL HIGH (ref 9–23)
BUN / CREAT RATIO: 5
BUN / CREAT RATIO: 5
CALCIUM: 7.9 mg/dL — ABNORMAL LOW (ref 8.7–10.4)
CALCIUM: 8.1 mg/dL — ABNORMAL LOW (ref 8.7–10.4)
CHLORIDE: 100 mmol/L (ref 98–107)
CHLORIDE: 104 mmol/L (ref 98–107)
CO2: 26 mmol/L (ref 20.0–31.0)
CO2: 25 mmol/L (ref 20.0–31.0)
CREATININE: 6.21 mg/dL — ABNORMAL HIGH
CREATININE: 5.96 mg/dL — ABNORMAL HIGH
EGFR CKD-EPI AA MALE: 11 mL/min/{1.73_m2} — ABNORMAL LOW (ref >=60)
EGFR CKD-EPI AA MALE: 12 mL/min/{1.73_m2} — ABNORMAL LOW (ref >=60)
EGFR CKD-EPI NON-AA MALE: 10 mL/min/{1.73_m2} — ABNORMAL LOW (ref >=60)
EGFR CKD-EPI NON-AA MALE: 10 mL/min/{1.73_m2} — ABNORMAL LOW (ref >=60)
GLUCOSE RANDOM: 186 mg/dL — ABNORMAL HIGH (ref 70–179)
GLUCOSE RANDOM: 100 mg/dL (ref 70–179)
POTASSIUM: 4.2 mmol/L (ref 3.4–4.5)
POTASSIUM: 4.1 mmol/L (ref 3.4–4.5)
SODIUM: 135 mmol/L (ref 135–145)
SODIUM: 137 mmol/L (ref 135–145)

## 2020-02-25 LAB — COMPREHENSIVE METABOLIC PANEL
ALBUMIN: 2.9 g/dL — ABNORMAL LOW (ref 3.4–5.0)
ALKALINE PHOSPHATASE: 101 U/L (ref 46–116)
ALT (SGPT): <7 U/L — ABNORMAL LOW (ref 10–49)
ANION GAP: 11 mmol/L (ref 5–14)
AST (SGOT): 11 U/L (ref <=34)
BILIRUBIN TOTAL: 0.3 mg/dL (ref 0.3–1.2)
BLOOD UREA NITROGEN: 29 mg/dL — ABNORMAL HIGH (ref 9–23)
BUN / CREAT RATIO: 5
CALCIUM: 8 mg/dL — ABNORMAL LOW (ref 8.7–10.4)
CHLORIDE: 100 mmol/L (ref 98–107)
CO2: 23 mmol/L (ref 20.0–31.0)
CREATININE: 5.48 mg/dL — ABNORMAL HIGH
EGFR CKD-EPI AA MALE: 13 mL/min/{1.73_m2} — ABNORMAL LOW (ref >=60)
EGFR CKD-EPI NON-AA MALE: 11 mL/min/{1.73_m2} — ABNORMAL LOW (ref >=60)
GLUCOSE RANDOM: 182 mg/dL — ABNORMAL HIGH (ref 70–179)
POTASSIUM: 4.1 mmol/L (ref 3.4–4.5)
PROTEIN TOTAL: 6.6 g/dL (ref 5.7–8.2)
SODIUM: 134 mmol/L — ABNORMAL LOW (ref 135–145)

## 2020-02-25 LAB — CBC W/ AUTO DIFF
BASOPHILS ABSOLUTE COUNT: 0.1 10*9/L (ref 0.0–0.1)
BASOPHILS RELATIVE PERCENT: 0.8 %
EOSINOPHILS ABSOLUTE COUNT: 0.2 10*9/L (ref 0.0–0.4)
EOSINOPHILS RELATIVE PERCENT: 2.7 %
HEMATOCRIT: 35.3 % — ABNORMAL LOW (ref 41.0–53.0)
HEMOGLOBIN: 11 g/dL — ABNORMAL LOW (ref 13.5–17.5)
LARGE UNSTAINED CELLS: 2 % (ref 0–4)
LYMPHOCYTES ABSOLUTE COUNT: 1.2 10*9/L — ABNORMAL LOW (ref 1.5–5.0)
LYMPHOCYTES RELATIVE PERCENT: 15.3 %
MEAN CORPUSCULAR HEMOGLOBIN CONC: 31.2 g/dL (ref 31.0–37.0)
MEAN CORPUSCULAR HEMOGLOBIN: 28.5 pg (ref 26.0–34.0)
MEAN CORPUSCULAR VOLUME: 91.3 fL (ref 80.0–100.0)
MEAN PLATELET VOLUME: 8.6 fL (ref 7.0–10.0)
MONOCYTES ABSOLUTE COUNT: 0.5 10*9/L (ref 0.2–0.8)
MONOCYTES RELATIVE PERCENT: 6.3 %
NEUTROPHILS ABSOLUTE COUNT: 5.8 10*9/L (ref 2.0–7.5)
NEUTROPHILS RELATIVE PERCENT: 73.3 %
PLATELET COUNT: 203 10*9/L (ref 150–440)
RED BLOOD CELL COUNT: 3.86 10*12/L — ABNORMAL LOW (ref 4.50–5.90)
RED CELL DISTRIBUTION WIDTH: 16.1 % — ABNORMAL HIGH (ref 12.0–15.0)
WBC ADJUSTED: 7.9 10*9/L (ref 4.5–11.0)

## 2020-02-25 LAB — BLOOD GAS CRITICAL CARE PANEL, VENOUS
BASE EXCESS VENOUS: 1.6 (ref -2.0–2.0)
CALCIUM IONIZED VENOUS (MG/DL): 4.31 mg/dL — ABNORMAL LOW (ref 4.40–5.40)
GLUCOSE WHOLE BLOOD: 176 mg/dL (ref 70–179)
HCO3 VENOUS: 28 mmol/L — ABNORMAL HIGH (ref 22–27)
HEMOGLOBIN BLOOD GAS: 10.3 g/dL — ABNORMAL LOW (ref 13.50–17.50)
LACTATE BLOOD VENOUS: 2 mmol/L — ABNORMAL HIGH (ref 0.5–1.8)
O2 SATURATION VENOUS: 34.5 % — ABNORMAL LOW (ref 40.0–85.0)
PCO2 VENOUS: 55 mmHg (ref 40–60)
PH VENOUS: 7.32 (ref 7.32–7.43)
PO2 VENOUS: 24 mmHg — ABNORMAL LOW (ref 30–55)
POTASSIUM WHOLE BLOOD: 4.2 mmol/L (ref 3.4–4.6)
SODIUM WHOLE BLOOD: 140 mmol/L (ref 135–145)

## 2020-02-25 LAB — MAGNESIUM
MAGNESIUM: 1.7 mg/dL (ref 1.6–2.6)
MAGNESIUM: 1.7 mg/dL (ref 1.6–2.6)

## 2020-02-25 LAB — PHOSPHORUS
PHOSPHORUS: 5.4 mg/dL — ABNORMAL HIGH (ref 2.4–5.1)
PHOSPHORUS: 4.9 mg/dL (ref 2.4–5.1)

## 2020-02-25 LAB — APTT
APTT: 36.8 s (ref 24.9–36.9)
APTT: 54.8 s — ABNORMAL HIGH (ref 24.9–36.9)
HEPARIN CORRELATION: 0.2
HEPARIN CORRELATION: 0.3

## 2020-02-25 LAB — B-TYPE NATRIURETIC PEPTIDE: B-TYPE NATRIURETIC PEPTIDE: 607 pg/mL — ABNORMAL HIGH (ref <=100)

## 2020-02-25 LAB — HIGH SENSITIVITY TROPONIN I - SINGLE: HIGH SENSITIVITY TROPONIN I: 2206 ng/L (ref <=53)

## 2020-02-25 MED ADMIN — heparin 25,000 Units/250 mL (100 units/mL) in 0.45% saline infusion (premade): 12 [IU]/kg/h | INTRAVENOUS | @ 08:00:00 | Stop: 2020-02-26

## 2020-02-25 MED ADMIN — atorvastatin (LIPITOR) tablet 80 mg: 80 mg | ORAL | @ 23:00:00 | Stop: 2020-02-26

## 2020-02-25 MED ADMIN — ticagrelor (BRILINTA) tablet 90 mg: 90 mg | ORAL | @ 01:00:00 | Stop: 2020-02-26

## 2020-02-25 MED ADMIN — metoprolol succinate (TOPROL-XL) 24 hr tablet 100 mg: 100 mg | ORAL | @ 13:00:00 | Stop: 2020-02-26

## 2020-02-25 MED ADMIN — insulin glargine (LANTUS) injection 20 Units: 20 [IU] | SUBCUTANEOUS | @ 01:00:00 | Stop: 2020-02-26

## 2020-02-25 MED ADMIN — insulin lispro (HumaLOG) injection 0-12 Units: 0-12 [IU] | SUBCUTANEOUS | @ 04:00:00 | Stop: 2020-02-26

## 2020-02-25 MED ADMIN — acetaminophen (TYLENOL) tablet 650 mg: 650 mg | ORAL | @ 01:00:00 | Stop: 2020-02-24

## 2020-02-25 MED ADMIN — pantoprazole (PROTONIX) EC tablet 40 mg: 40 mg | ORAL | @ 13:00:00 | Stop: 2020-02-26

## 2020-02-25 MED ADMIN — insulin lispro (HumaLOG) injection 0-12 Units: 0-12 [IU] | SUBCUTANEOUS | @ 12:00:00 | Stop: 2020-02-26

## 2020-02-25 MED ADMIN — amiodarone (PACERONE) tablet 200 mg: 200 mg | ORAL | @ 13:00:00 | Stop: 2020-02-26

## 2020-02-25 MED ADMIN — acetaminophen (TYLENOL) tablet 1,000 mg: 1000 mg | ORAL | @ 23:00:00 | Stop: 2020-02-26

## 2020-02-25 MED ADMIN — ticagrelor (BRILINTA) tablet 90 mg: 90 mg | ORAL | @ 13:00:00 | Stop: 2020-02-26

## 2020-02-25 MED ADMIN — epoetin alfa-EPBx (RETACRIT) injection 4,000 Units: 4000 [IU] | INTRAVENOUS | @ 22:00:00 | Stop: 2020-02-26

## 2020-02-25 MED ADMIN — gabapentin (NEURONTIN) capsule 300 mg: 300 mg | ORAL | @ 01:00:00 | Stop: 2020-02-26

## 2020-02-25 MED ADMIN — heparin (porcine) 1000 unit/mL injection 2,000 Units: 2000 [IU] | INTRAVENOUS | @ 13:00:00 | Stop: 2020-02-26

## 2020-02-25 MED ADMIN — ezetimibe (ZETIA) tablet 10 mg: 10 mg | ORAL | @ 01:00:00 | Stop: 2020-02-26

## 2020-02-25 MED ADMIN — sevelamer (RENVELA) tablet 800 mg: 800 mg | ORAL | @ 23:00:00 | Stop: 2020-02-26

## 2020-02-25 MED ADMIN — sevelamer (RENVELA) tablet 800 mg: 800 mg | ORAL | @ 13:00:00 | Stop: 2020-02-26

## 2020-02-25 MED ADMIN — gabapentin (NEURONTIN) capsule 300 mg: 300 mg | ORAL | @ 13:00:00 | Stop: 2020-02-26

## 2020-02-25 MED ADMIN — aspirin chewable tablet 81 mg: 81 mg | ORAL | @ 13:00:00 | Stop: 2020-02-26

## 2020-02-25 MED ADMIN — isosorbide mononitrate (IMDUR) 24 hr tablet 60 mg: 60 mg | ORAL | @ 13:00:00 | Stop: 2020-02-26

## 2020-02-25 MED ADMIN — heparin 25,000 Units/250 mL (100 units/mL) in 0.45% saline infusion (premade): 12 [IU]/kg/h | INTRAVENOUS | @ 22:00:00 | Stop: 2020-02-26

## 2020-02-25 NOTE — Unmapped (Signed)
Plan of care reviewed with pt. Pt agreeable. Pt concerned regarding plan and possible heart cath. Update given to spouse. VSS on RA. Alert and oriented x4. Pain management provided per orders. Pt c/o 6/10 chest pain. MD made aware. ECG performed. Pain relieved without intervention. Heparin gtt maintained. Pt educated regarding fall precautions. Pt declines bed alarm, stating he will turn it off by himself if this RN turns it on. This RN stressed the importance of calling for help with ambulation. Pt reluctantly agreed to call for help when needed. CPAP used intermittently throughout the night. Call bell within reach. Bed low and locked. Will continue to monitor.     Problem: Adult Inpatient Plan of Care  Goal: Plan of Care Review  Outcome: Ongoing - Unchanged  Flowsheets (Taken 02/24/2020 2330)  Progress: improving  Plan of Care Reviewed With:  ??? patient  ??? spouse  Goal: Patient-Specific Goal (Individualized)  Outcome: Ongoing - Unchanged  Flowsheets (Taken 02/24/2020 2330)  Patient-Specific Goals (Include Timeframe): Pt will sleep at least 5 hours this shift.  Individualized Care Needs: Cluster care, telemetry, blood glucose monitoring, ACHS, hep gtt, CPAP, fall precautions  Anxieties, Fears or Concerns: NPO and possible plan

## 2020-02-25 NOTE — Unmapped (Signed)
CICU Progress Note    Hospital Day: 3    Hospital Course:    11/26: Patient admitted for NSTEMI and afib with RVR  11/27: Patient with acute SOB during dialysis with code called; patient transferred to CICU      Subjective / Interval History:    Transfer to CICU:  On 11/27 during hemodialysis, patient became acutely short of breath with difficulty breathing, became nonresponsive. Patient placed on nonrebreather mask. Breathing improved and patient with main complaint of worsening left lower extremity. EKG without significant changes. CBC, CMP, without significant changes from baseline. Trop elevated to 2200 but downtrending. VBG with acidosis similar to prior and normal glucose. Patient transferred to CICU with workup pending: BNP, CT non con head, CXR, VBG, PVL LLE, XR femur, XR knee. Upon arrival, patient doing well, vitals HR 70 BP 157/96, 99 on 2L [place].    Assessment/Plan:        Principal Problem:    NSTEMI (non-ST elevation myocardial infarction) (CMS-[name])  Active Problems:    Hypercholesteremia    Diabetes mellitus with coincident hypertension (CMS-[name])    Obesity, Class III, BMI 40-49.9 (morbid obesity) (CMS-[name])    GERD (gastroesophageal reflux disease)    Paroxysmal atrial fibrillation (CMS-[name])    OSA (obstructive sleep apnea)    Tobacco use disorder    HFrEF (heart failure with reduced ejection fraction) (CMS-[name])    ESRD needing dialysis (CMS-[name])  Resolved Problems:    * No resolved hospital problems. *      Mr. Bunyan is a 49 y.o. male with a PMHx of ESRD, CAD??s/p??multiple PCIs,??hx of VT after STEMI in 2018 s/p??PM/ICD,??T2DM, HTN, HLD, HFrEF (EF 20-25%),??TIA, A-fib, OSA on CPAP,??and??bilateral DVTs??who presented with NSTEMI and afib with RVR. Now transferred to CICU for acute dyspnea.     Neurological   #History of TIA  Patient with history of TIA (Jan 2019). Patient reports chronic bilateral lower extremity numbness. Acute SOB and unresponsivness on 11/27 during hemodialysis although patient quickly returned to baseline once in CICU. Given history, possible concern for TIA vs stroke. Patient does have history of Afib, but properly anticoagulated during hospitalization and on Apixiban at home. Will get CT Head non-con. Possible Brain MRI.   --non con CT H  --Consider MRI brain     Pulmonary   #Acute SOB  On 11/27 patient acutely SOB and non-responsive during hemodialysis requiring with a code called but no resuscitation needed as patient quickly responded. CBC,CMP, VBG at baseline. Glucose normal. BNP and CXR pending. Upon transfer to CICU, patient no longer SOB and vitals stable with sat 99 on 2L [place]. Unlikely PE, given proper anticoagulation. Will check CXR for pulmonary congestion given worsening heart failure during this admission. Possible TIA given unresponsiveness.CTH ordered. Will get PVL LLE given pain in that leg, although patient on anticoagulation. Given quick return to baseline could have also been anxiety attack.   --CT head  --CXR   --PVL LLE    Cardiovascular   #NSTEMI  #Multivessel CAD s/p multiple PCI  Patient with history of multiple NSTEMI and stent placements with this admission with chest pain and elevated trops. Recent cath on 01/25/2020 noting severe multivessel disease without intervention given high risk. TTE 11/26 with EF (20-25%).  PCI was not performed during this hospitalization given high risk. Patient on Brilinta, Statin, Zetia. Will continue GDMT. Consult to palliative care for GOC discussion.   --Palliative care consult  -- Heparin drip (in place of apixaban)  -- Brilinta   --  Imdur 60 mg daily  --Atorvastatin 80mg    --Zetia 10mg      #History of DVT  #Leg Pain  Patient with LLE leg pain that started three days prior to admission. Acutely worsened on 11/27 during hemodialysis. Unlikely to be DVT, given proper anticoagulation during hospitalization and on Eliquis at home. Will get XR Femur, knee. PVL LLE to rule out DVT.  --XR Femur  --XR knee  --PVL LLE    #Paroxysmal a-fib  Presented in Afib with RVR, now rate controlled. Continue home meds.  --Metoprolol 100mg  daily  --Amiodaraone 200mg  daily   --Heparin drip    #HFrEF  TTE this admission with EF 20-25%, decreased since his last one in Oct 2021. Will hold Bumetanide today given hemodialysis.     #HTN  History of hypertension. BP in the 150-160s upon admission to CICU. No hypotension during acute event. Since BP stable, can continue medications.       Renal   #ESRD on iHD  Patient with acute event during iHD on 11/27. Since improving, would like to trial iHD tonight.  --Continue iHD tonight     Infectious Disease   NAI    FEN/GI   #Electrolyte: Replete PRN    Malnutrition Assessment using AND/ASPEN Clinical Characteristics:                           Heme/Coag   #Normocytic Anemia  Anemia of chronic disease 2/2 ESRD. H/H stable.     Endocrine   #DMII  --Lantus 20 units  --Lispro 12 units with meals  --SSI    Prophylaxis   ? VTE: Heparin gtt  ? GI: Not indicated      Code Status: Full    Dispo: CICU      Objective:      Vitals - past 24 hours  Temp:  [35.7 ??C-36.8 ??C] 36.8 ??C  Heart Rate:  [70-102] 70  SpO2 Pulse:  [72] 72  Resp:  [13-25] 14  BP: (133-161)/(66-96) 157/96  FiO2 (%):  [40 %] 40 %  SpO2:  [96 %-100 %] 100 % Intake/Output  I/O last 3 completed shifts:  In: 240 [P.O.:240]  Out: 2550 [Urine:2550]       Physical Exam:    General: well-appearing, obese, in no acute distress   HEENT: PERRL, OP clear without lesions  CV: RRR, no m/r/g  Lungs: CTAB, normal wob  Abd: soft, non-tender, non-distended, +bs  Extremities: +1 peripheral edema, 1+ peripheral pulses  Skin: no visible lesions or rashes  Neuro: alert and oriented, no gross focal deficits    @LDAPRESSUREULCER @    Continuous Infusions:   ??? heparin 13 Units/kg/hr (02/25/20 0754)   ??? NxStage RFP 400 (+/- BB) 5000 mL - contains 2 mEq/L of potassium     ??? NxStage/multiBic RFP 401 (+/- BB) 5000 mL - contains 4 mEq/L of potassium         Vent settings for last 24 hours:  FiO2 (%): 40 %    Tubes and Drains:  Patient Lines/Drains/Airways Status     Active Active Lines, Drains, & Airways     Name Placement date Placement time Site Days    Peripheral IV 02/23/20 Right Antecubital 02/23/20  1900  Antecubital  1    Peripheral IV 02/25/20 Right Hand 02/25/20  1115  Hand  less than 1    Hemodialysis Catheter 07/27/19 Right Internal jugular 2.2 mL 2.3 mL 07/27/19  1018  Internal jugular  213  Data Review:   Recent Labs     02/25/20  0504 02/25/20  1102   WBC 7.9 8.4   HGB 11.0* 10.6*   HCT 35.3* 34.0*   PLT 203 224     Recent Labs     02/24/20  0553 02/25/20  0504 02/25/20  1102   NA 138 135 134* - 140   K 3.6 4.2 4.1 - 4.2   CL 102 100 100   CO2 27.0 26.0 23.0   BUN 28* 34* 29*   CREATININE 5.35* 6.21* 5.48*   GLU 139 186* 182*   MG 1.5* 1.7  --    PHOS 5.3* 5.4*  --       Recent Labs     02/23/20  1637 02/25/20  1102   BILITOT 0.2* 0.3   PROT 6.7 6.6   ALBUMIN 2.8* 2.9*   ALT 9* <7*   AST 12 11   ALKPHOS 102 101      Recent Labs     02/23/20  1637 02/24/20  0553 02/25/20  0504   INR 1.07  --   --    APTT  --  55.5* 36.8

## 2020-02-25 NOTE — Unmapped (Signed)
Cardiology - Team 1 [facility]) Progress Note    Assessment & Plan:   Jeremy Oliver is a 49 y.o. male with a PMHx of ESRD on dialysis ([name]), CAD??s/p??multiple PCIs (last 12/2019 on Brillinta/Eliquis),??hx of VT after STEMI in 2018 s/p PM/ICD,??T2DM, HTN, HLD, HFrEF (EF 35-40%),??TIA, A-fib??(on apixaban), OSA on CPAP, and bilateral DVTs??who presents for evaluation of chest pain and noted to be in A. fib with RVR with NSTEMI.     Principal Problem:    NSTEMI (non-ST elevation myocardial infarction) (CMS-[name])  Active Problems:    Hypercholesteremia    Diabetes mellitus with coincident hypertension (CMS-[name])    Obesity, Class III, BMI 40-49.9 (morbid obesity) (CMS-[name])    GERD (gastroesophageal reflux disease)    Paroxysmal atrial fibrillation (CMS-[name])    OSA (obstructive sleep apnea)    Tobacco use disorder    HFrEF (heart failure with reduced ejection fraction) (CMS-[name])    ESRD needing dialysis (CMS-[name])  Resolved Problems:    * No resolved hospital problems. *      Dyspnea  During dialysis on 11/27, rapid response called for significantly increased work of breathing.  Patient satting at 100% on nonrebreather while resting in dialysis chair and otherwise hemodynamically stable.  Noted feeling like he couldn't breath with severe chest pain and leg pain worse than previously. During consideration for intubation, breathing improved and patient noted pain was improving. Suspect Imm be due to volume overload in setting of missing day of dialysis without diuretic treatment vs anxiety. Jeremy Oliver also consider worsening ACS although EKG obtained did not show any new changes.   - Transfer to CICU for CRRT  - CXR    Chest Pain - NSTEMI - Multivessel CAD s/p multiple PCI ??  Current chest pain likely secondary to demand in the setting of A. fib with RVR and severe CAD (see cardiac history below) versus thrombotic event, which would be less likely on his Brilinta and Eliquis. EKG showing ST depressions in V2???V6 which largely resolved with rate control.  Troponin 140>>422>>3593.  Echo obtained 11/26 shows EF 20???25% (decreased from 35-40% on 10/28), grade III diastolic dysfunction, apical anterior wall, apical, mid anterior wall hypokinesis, RV with severely reduced systolic.  Determined to not be appropriate for LHC at this time and will instead continue with optimization of medical management.  - Trend troponin  - Heparin drip (in place of apixaban)  - Continue home Brilinta  - Imdur 60 mg daily  - Statin, Zetia  - Consider starting Jardiance    Paroxysmal a-fib  Longstanding history on amiodarone and apixaban at home. ??Was in A. fib with RVR on presentation, now being paced on telemetry and EKG today.  -Home metoprolol 100 mg daily and amiodarone 200 mg daily  -Holding apixaban as above while on heparin drip  ??  HFrEF - HTN  Echo obtained 11/26 shows EF 20???25% (decreased from 35-40% on 10/28), grade III diastolic dysfunction, apical anterior wall, apical, mid anterior wall hypokinesis, RV with severely reduced systolic   Makes pretty good urine even with ESRD.  -Bumetanide 2 mg twice daily on nondialysis days  - metoprolol 100 mg daily  ??  ESRD on iHD [name]  -Nephrology consult for routine HD  -Dialysis today, transfer to CICU for CRRT  -Continue sevelamer 800 mg 3 times daily AC  ??  Normocytic anemia  At baseline currently; consistent with anemia of chronic renal disease.  ??  T2DM: Home regimen of reported to be aspart 15 units with meals and  Levemir 25 units nightly  -Lantus 20 units  -Lispro 12 units with meals  -SSI    Leg Pain  Patient states he had a fall 3 days ago and has had left-sided pain including severe left thigh pain when ambulating.  Patient is tender to palpation but no significant swelling noted.  Hemoglobin is improved from 1 month ago with low suspicion for hematoma.  Kosh also consider DVT although patient has been compliant with anticoagulation.  -Daily CBC  -CTM    Daily Checklist:  Diet: Regular Diet  DVT PPx: Patient Already on Full Anticoagulation with Heparin gtt  Electrolytes: Replete Potassium to >/=4 and Magnesium to >/=2  Code Status: Full Code  Dispo: Transfer to CICU    Team Contact Information:   Primary Team: Cardiology - Team 1 (MEDC1)  Primary Resident: Jeremy Plants, MD  Resident's Pager: [phone] (Cardiology Team 1 Intern)    Interval History:     Patient was feeling occasional episodes of chest pain and lightheadedness yesterday which had resolved this morning.  At this time we will not pursue LHC.  Patient scheduled for dialysis today.    During dialysis, rapid response called for significantly increased work of breathing.  Patient satting at 100% on nonrebreather while resting in dialysis chair and otherwise hemodynamically stable.  Noted severe chest pain and leg pain worse than previous pain. Labs were drawn and EKG obtained which showed atrial pacing with no significant changes from prior. Occasional periods of unresponsiveness with palpable pulse. During consideration for intubation, breathing improved and patient noted pain was improving. He was able to sit up and transfer to stretcher with assistance. Patient to be transferred to CICU for further care with CRRT and monitoring.     Objective:   Temp:  [35.7 ??C-36.8 ??C] 36.8 ??C  Heart Rate:  [70-71] 70  Resp:  [14-20] 16  BP: (133-161)/(66-93) 159/91  FiO2 (%):  [40 %] 40 %  SpO2:  [96 %-100 %] 98 %    Gen: Obese gentleman in distress sitting in dialysis chair, occasional episodes of unresponsiveness  Eyes: sclera anicteric, EOMI  HENT: atraumatic, MMM, OP w/o erythema or exudate   Heart: RRR, S1, S2, no M/R/G, no chest wall tenderness  Lungs: CTAB, no crackles or wheezes, increased work of breathing on nonrebreather  Abdomen: Normoactive bowel sounds, soft, NTND, no rebound/guarding  Extremities: no clubbing, cyanosis, or edema in the BLEs.  Severe tenderness to palpation of left upper thigh.  No swelling or bruising noted. 2+ pulses in all extremities  Psych: Alert, oriented, appropriate mood and affect    Labs/Studies: Labs and Studies from the last 24hrs per EMR and Reviewed

## 2020-02-25 NOTE — Unmapped (Signed)
Care Management  Initial Transition Planning Assessment    CM met with patient in pt room.  Pt/visitors were not wearing hospital provided masks for the duration of the interaction with CM.   CM was wearing hospital provided surgical mask and hospital provided eye protection.  CM was within 6 foot of the patient/visitors during this interaction.               General  Care Manager assessed the patient by : In person interview with patient,Medical record review  Orientation Level: Oriented X4  Functional level prior to admission: Independent  Reason for referral: Discharge Planning    Contact/Decision Maker  Extended Emergency Contact Information  Primary Emergency Contact: Shryock,[name]  Address: [address]           [place], [place] [address] [place] Phone: [phone]  Relation: Spouse  Secondary Emergency Contact: Pantano,Bridgette   [place]  Home Phone: [phone]  Mobile Phone: [phone]  Relation: Daughter    Legal Next of Kin / Guardian / POA / Advance Directives     [name] (HCPOA): Caridi,[name] - [phone]    [name], First Alternate: Felan,Bridgette - Daughter - [phone]    Advance Directive (Medical Treatment)  Does patient have an advance directive covering medical treatment?: Patient has advance directive covering medical treatment, copy in chart.    Health Care Decision Maker [[name]] (Medical & Mental Health Treatment)  Healthcare Decision Maker: [name] documented in the [name]/Contact Info section.  Information offered on [name], Medical & Mental Health advance directives:: Patient declined information.       Advance Directive (Mental Health Treatment)  Does patient have an advance directive covering mental health treatment?: Patient has advance directive covering mental health treatment, copy in chart.    Patient Information  Lives with: Spouse/significant other    Type of Residence: Private residence        Location/Detail: Patient lives with wife [name] at [address], [place], [place], [address]. 2 steps to enter. Patient's phone: [phone]. Patient's wife [name]'s phone: [phone]    Support Systems/Concerns: Friends/Neighbors,Spouse    Responsibilities/Dependents at home?: No    Home Care services in place prior to admission?: No          Outpatient/Community Resources in place prior to admission:  (dialysis)       Equipment Currently Used at Home: cane, straight,wheelchair, manual,walker, rolling,shower chair,commode chair,respiratory supplies (BIPAP)  Current [employer] (Name/Phone #): [facility]: [phone]    Currently receiving outpatient dialysis?: Yes  Facility providing dialysis (Name/Contact Info): [facility] [place] [facility] Phone: [phone]    Financial Information       Need for financial assistance?: No       Social Determinants of Health  Housing/Utilities: Low Risk    ??? Within the past 12 months, have you ever stayed: outside, in a car, in a tent, in an overnight shelter, or temporarily in someone else's home (i.e. couch-surfing)?: No   ??? Are you worried about losing your housing?: No   ??? Within the past 12 months, have you been unable to get utilities (heat, electricity) when it was really needed?: No     Financial Resource Strain: Low Risk    ??? Difficulty of Paying Living Expenses: Not very hard   Food Insecurity: No Food Insecurity   ??? Worried About Running Out of Food in the Last Year: Never true   ??? Ran Out of Food in the  Last Year: Never true   Transportation Needs: No Transportation Needs   ??? Lack of Transportation (Medical): No   ??? Lack of Transportation (Non-Medical): No         Discharge Needs Assessment  Concerns to be Addressed: denies needs/concerns at this time. Patient denies current alcohol use, current drug use, or cigarette smoking.    Clinical Risk Factors: Multiple Diagnoses (Chronic).   Medical Provider(s): Kurtis Bushman, MD  Reason for Admission: Admitting Diagnosis:  Elevated troponin [R77.8]  Chest pain, unspecified type [R07.9]  Past Medical History:   has a past medical history of Angina at rest (CMS-[name]), Arthritis, Asthma, Atrial fibrillation and flutter (CMS-[name]) (06/08/14), BMI 40.0-44.9, adult (CMS-[name]) (10/29/2015), Chronic anticoagulation (02/17/2018), Chronic pain syndrome (04/11/2014), Coronary artery disease, Depression, Diabetes mellitus (CMS-[name]), ESRD (end stage renal disease) on dialysis (CMS-[name]), Eye trauma (1998), GERD (gastroesophageal reflux disease), Gout, Homeless, Hyperlipidemia, Hypertension, Illiteracy and low-level literacy, Microscopic hematuria, Migraine, Nephrolithiasis, 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  Past Surgical History:   has a past surgical history that includes Knee surgery (Right, 09/23/2006); Cardiac catheterization (03/08/2007); Cystourethroscopy (12/31/2009 ); pr inser hart pacer xvenous atrial (N/A, 05/30/2015); pr ephys eval w/ ablation supravent arrhythmia (N/A, 07/19/2015); pr upper gi endoscopy,biopsy (N/A, 02/28/2016); pr upper gi endoscopy,diagnosis (N/A, 05/07/2016); pr cath place/coron angio, img super/interp,r&l hrt cath, l hrt ventric (N/A, 02/13/2017); pr cath place/coron angio, img super/interp,w left heart ventriculography (N/A, 03/17/2017); pr insert/place flow direct cath (N/A, 03/17/2017); pr ecmo/ecls initiation veno-arterial (N/A, 03/19/2017); pr rebl ves direct,low extrem (Left, 03/19/2017); pr ecmo/ecls rmvl prph cannula open 6 yrs & older (Left, 03/25/2017); pr remv art clot iliac-pop,leg incis (Left, 03/25/2017); pr negative pressure wound therapy dme >50 sq cm (Left, 03/25/2017); pr insj/rplcmt perm dfb w/trnsvns lds 1/dual chmbr (N/A, 05/04/2017); pr prq trluml coronary stent w/angio one art/brnch (N/A, 03/12/2018); pr cath place/coron angio, img super/interp,w left heart ventriculography (N/A, 05/12/2018); pr cath place/coron angio, img super/interp,w left heart ventriculography (N/A, 10/22/2018); Coronary stent placement; pr cath place/coron angio, img super/interp,w left heart ventriculography (N/A, 08/19/2019); and pr prq trluml coronary stent w/angio one art/brnch (N/A, 01/27/2020).   Previous admit date: 01/25/2020    Primary Insurance- Payor: MEDICARE / Plan: MEDICARE PART A AND PART B / Product Type: *No Product type* /   Secondary Insurance ??? Secondary Insurance  MEDICAID [place]  Prescription Coverage ??? Medicaid  Preferred Pharmacy - [facility] - [place], [place] - [address]  [facility]  [facility]  [facility]  [facility] - [place] ([place] - [address]    Transportation home: Private vehicle          Barriers to taking medications: No    Prior overnight hospital stay or ED visit in last 90 days: Yes    Readmission Within the Last 30 Days: current reason for admission unrelated to previous admission    Patient's Choice of Community Agency(s): pcp is Dr. Talbert Forest    Anticipated Changes Related to Illness: none    Equipment Needed After Discharge: none    Discharge Facility/Level of Care Needs:  (pending)    Readmission  Risk of Unplanned Readmission Score: UNPLANNED READMISSION SCORE: 39%  Predictive Model Details  39% (High)  Factor Value    Calculated 02/24/2020 16:03 25% Number of active Rx orders 56    [place] Risk of Unplanned Readmission Model 14% Number of ED visits in last six months 4     9% Number of hospitalizations in last year 3     6% ECG/EKG order present in last 6 months     6% Charlson Comorbidity Index 8     6% Latest calcium low (7.8 mg/dL)     5% Latest BUN high (28 mg/dL)     5% Diagnosis of electrolyte disorder present     4% Imaging order present in last 6 months     4% Latest hemoglobin low (11.3 g/dL)     4% Phosphorous result present     3% Active anticoagulant Rx order present     3% Latest creatinine high (5.35 mg/dL)     3% Age 46     2% Diagnosis of renal failure present     1% Future appointment scheduled     1% Active ulcer medication Rx order present     0% Current length of stay 0.324 days      Readmitted Within the Last 30 Days? (No if blank) Yes  Patient at risk for readmission?: Yes    Discharge Plan  Screen findings are: Care Manager reviewed the plan of the patient's care with the Multidisciplinary Team. No discharge planning needs identified at this time. Care Manager will continue to manage plan and monitor patient's progress with the team.    Expected Discharge Date:     Expected Transfer from Critical Care:      Quality data for continuing care services shared with patient and/or representative?: N/A  Patient and/or family were provided with choice of facilities / services that are available and appropriate to meet post hospital care needs?: Yes   List choices in order highest to lowest preferred, if applicable. : prefers [facility] for DME and [facility]    Initial Assessment complete?: Yes      [facility] Manager  Desk Phone: [phone]; Pager: [phone]

## 2020-02-25 NOTE — Unmapped (Signed)
ADULT SPECIALTY CARE TEAM  Transport Summary Note     Departing Unit: TICU Departure Time: 1315   Unit Returned To: TICU Return Time: 1445           Report received from primary nurse via SBARq. Patient prepared to transport to CT Scan via stretcher under ICU Transport Protocol. Vital signs during transport, see vital signs section of DocFlowsheet for further details. Patient is able to follow commands. O2 via nasal cannula @ 2 Lpm. Patient tolerated procedure poorly. Pandemicprecautions maintained throughout transport.     During movement, pt had episodes of severe pain in his lungs, and held his breath and froze.  Appeared labored for a short time afterwards, Vitals stable.  Difficult to get him transported due to this.  MDs to room during episode, and order for CT chest without contrast ordered.  Pt currently not stable enough for Xrays, and encourage these to be portable.      Returned to [employer], update and care given to primary nurse. See Doc Flowsheet for additional transport documentation.

## 2020-02-25 NOTE — Unmapped (Addendum)
Jeremy Oliver is a 49 y.o. male with PMHx of of ESRD on dialysis ([name]), CAD s/p multiple PCIs (last 12/2019 on Brillinta/Eliquis), hx of VT after STEMI in 2018 s/p PM/ICD, T2DM, HTN, HLD, HFrEF (EF 35-40%), TIA, A-fib (on apixaban), OSA on CPAP, and bilateral DVTs who presents for evaluation of chest pain and noted to be in A. fib with RVR with NSTEMI.  ARRT called on 11/27 while patient was in dialysis for AMS and acute onset dyspnea. Vitals were all stable, but given ill-appearance at the time, he was transferred to the CICU for further management. He underwent w/u for AMS, stroke and seizure, and all was reassuring. Wtihin 24 hours, he had tolerated repeat dialysis and done well in the ICU and was therefore transferred back to a floor team on 11/28.      Acute SOB - Acute AMS c/f pseudoseizures vs panic attack  On 11/27 patient acutely SOB and non-responsive during hemodialysis requiring with a code called but no resuscitation needed as patient quickly responded. CBC,CMP, VBG at baseline. Glucose normal. BNP and CXR pending. Upon transfer to CICU, patient no longer SOB and vitals stable with sat 99 on 2L [place]. CXR and non-con CT chest without acute findings. CTH NL. Patient had three more episodes during the day- each time patient becomes unresponsive with normal vitals. He quickly returns to his baseline. Given quick return to baseline could have also been anxiety attack vs pseudoseizure. 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 PVLs negative. Patient noted worsening anxiety and racing thoughts prior to discharge, and asked to be restarted on medications for mood symptoms. Previously managed by his PCP. Given lack of follow-up with our team, we have prescribed him very low dose Remeron at the time of discharge. We recommend attention to follow-up regarding these issues and ongoing medication management for anxiety and depression.      Chest Pain - NSTEMI - Multivessel CAD s/p multiple PCI    Current chest pain likely secondary to demand in the setting of A. fib with RVR and severe CAD (see cardiac history below) versus thrombotic event, which would be less likely on his Brilinta and Eliquis. EKG showing ST depressions in V2-V6 which largely resolved with rate control.  Troponin 140>>422>>3593 and peaked at 6076 on 11/26. Not a PCI candidate during this hospitalization given advanced nature of his disease. He was managed medically. Patient and wife expressed understanding regarding the severity of his disease, however also expressed emotional distress over his health. Would recommend outpatient palliative care referral for further support and discussions regarding future desires.      Coronary history: S/p STEMI in 2018 with VT arrest leading to ICD placement and DES to LAD.  NSTEMI in 02/2018 with DES x2 to mid RCA and RPLA/RPDA bifurcation.  NSTEMI in 05/2018 with PCI to proximal/mid LAD.  NSTEMI in 09/2018 with PCI to distal RCA.  On 07/2019 had DES x2 to the LAD and left circumflex as well as balloon angioplasty of diagonal 2.  S/p LHC at [facility] on 01/25/2020 noting severe multivessel disease without intervention given high risk. Transferred to [facility] for consideration of high risk PCI.  On 10/29, patient underwent PCI to the LMCA/LAD with 3.0 x 16 mm Synergy DES.      Paroxysmal a-fib  Longstanding history on amiodarone and apixaban at home.  Was in A. fib with RVR on presentation. Was continued on home meds.     HFrEF -  HTN  Appears well compensated at this time. Echo from 10/28 showing LVEF 35-40%.  Makes pretty good urine even with ESRD. Wsa restarted on jardiance while hospitalized. Would recommend further titration and addition of GDMT as able. Has been limited in the past 2/2 side effects and hypotension w/ dialysis.      ESRD on iHD [name]: dialyzed while hospitalized.      Normocytic anemia  At baseline currently; consistent with anemia of chronic renal disease.     T2DM: Home regimen of reported to be aspart 15 units with meals and Levemir 25 units nightly. He was continued on this regimen at discharge.

## 2020-02-25 NOTE — Unmapped (Signed)
ADVANCED CARE PLANNING NOTE    Discussion Date:  February 25, 2020    Patient has decisional Capacity:  Yes    Surrogate Decision Maker:  No    Health Care Power of Attorney:  Yes  Name:  Jeremy Oliver   Contact Information:  [phone]     Discussion Participants:  Delle Reining, MD  [facility]    Communication of Medical Status/Prognosis:   We discussed the events surrounding his acute obtundation during dialysis, and initial concern for requirement of intubation.  Luckily, he currently is stable and does not require any invasive interventions.  I asked patient and his wife about their understanding of his overall cardiac history.  They voiced understanding that his CAD and ischemic cardiomyopathy were very bad and are life limiting in nature.  When discussing CODE STATUS with patient/wife in a nonemergent setting, they stated that wife had healthcare power of attorney and would want everything done in the setting of altered mental status.  Patient was voiced agreement to wife's statement.    Treatment Decisions:   Continue full code

## 2020-02-25 NOTE — Unmapped (Signed)
[facility] Hemodialysis Procedure Note     02/25/2020    Jeremy Oliver presented for hemodialysis    CHIEF COMPLAINT: End Stage Renal Disease    INTERVAL HISTORY: Presented for dialysis this morning. Rapid response was called for worsening SOB. Patient felt he could not breathe. Taken off dialysis. Code blue was called, intubation considered, however patient maintained pulse and breathing recovered. Transferred to TICU.    DIALYSIS TREATMENT DATA:  Estimated Dry Weight (kg): 155.5 kg (342 lb 13 oz)  Patient Goal Weight (kg): 3 kg (6 lb 9.8 oz)  Dialyzer: F-180 (98 mLs)  Dialysis Bath  Bath: 2 K+ / 2.5 Ca+  Dialysate Na (mEq/L): 137 mEq/L  Dialysate HCO3 (mEq/L): 35 mEq/L     Dialysis Flow (mL/min): 800 mL/min    PHYSICAL EXAM:  Vitals:  Temp:  [35.7 ??C-36.8 ??C] 36.8 ??C  Heart Rate:  [70-71] 70  BP: (133-161)/(66-93) 159/91  MAP (mmHg):  [94-116] 102  Weights:  Pre-Treatment Weight (kg): 163.1 kg (359 lb 9.1 oz)    General: doing better after rapid response, able to transfer to stretcher  Pulmonary: had increased WOB  Cardiovascular: normal rate  Access: Right IJ non-tunneled catheter     LAB DATA:  Lab Results   Component Value Date    NA 140 02/25/2020    K 4.2 02/25/2020    CL 100 02/25/2020    CO2 26.0 02/25/2020    BUN 34 (H) 02/25/2020    CREATININE 6.21 (H) 02/25/2020    CALCIUM 7.9 (L) 02/25/2020    MG 1.7 02/25/2020    PHOS 5.4 (H) 02/25/2020    ALBUMIN 2.8 (L) 02/23/2020      Lab Results   Component Value Date    HCT 34.0 (L) 02/25/2020    WBC 8.4 02/25/2020        ASSESSMENT/PLAN:  End Stage Renal Disease on Intermittent Hemodialysis:  Initial consideration for CRRT after rapid response, however given clinical stability will trial iHD again today.  UF goal: 2L as tolerated  Adjust medications for a GFR <10  Avoid nephrotoxic agents     Bone Mineral Metabolism:  Lab Results   Component Value Date    CALCIUM 7.9 (L) 02/25/2020    CALCIUM 7.8 (L) 02/24/2020    Lab Results   Component Value Date ALBUMIN 2.8 (L) 02/23/2020    ALBUMIN 2.5 (L) 01/25/2020      Lab Results   Component Value Date    PHOS 5.4 (H) 02/25/2020    PHOS 5.3 (H) 02/24/2020    Lab Results   Component Value Date    PTH 403.4 (H) 07/25/2019    PTH 395.4 (H) 04/14/2019      Labs appropriate, no changes.   ? calcitriol 1 mcg every treatment, check outside records    Anemia:   Lab Results   Component Value Date    HGB 10.6 (L) 02/25/2020    HGB 11.0 (L) 02/25/2020    HGB 10.5 (L) 02/24/2020    Iron Saturation (%)   Date Value Ref Range Status   07/25/2019 11 (L) 20 - 50 % Final      Lab Results   Component Value Date    FERRITIN 150.0 07/25/2019       Anemia labs appropriate, no changes.  ? Venofer 100 mg every treatment. Mircera 60 mcg every 2 weeks. Obtain outside records.    Daleen Snook, MD  [facility] Division of Nephrology & Hypertension

## 2020-02-26 LAB — BASIC METABOLIC PANEL
ANION GAP: 8 mmol/L (ref 5–14)
BLOOD UREA NITROGEN: 20 mg/dL (ref 9–23)
BUN / CREAT RATIO: 4
CALCIUM: 7.8 mg/dL — ABNORMAL LOW (ref 8.7–10.4)
CHLORIDE: 102 mmol/L (ref 98–107)
CO2: 27 mmol/L (ref 20.0–31.0)
CREATININE: 4.64 mg/dL — ABNORMAL HIGH
EGFR CKD-EPI AA MALE: 16 mL/min/{1.73_m2} — ABNORMAL LOW (ref >=60)
EGFR CKD-EPI NON-AA MALE: 14 mL/min/{1.73_m2} — ABNORMAL LOW (ref >=60)
GLUCOSE RANDOM: 275 mg/dL — ABNORMAL HIGH (ref 70–179)
POTASSIUM: 3.9 mmol/L (ref 3.4–4.5)
SODIUM: 137 mmol/L (ref 135–145)

## 2020-02-26 LAB — CBC W/ AUTO DIFF
BASOPHILS ABSOLUTE COUNT: 0.1 10*9/L (ref 0.0–0.1)
BASOPHILS RELATIVE PERCENT: 0.7 %
EOSINOPHILS ABSOLUTE COUNT: 0.2 10*9/L (ref 0.0–0.4)
EOSINOPHILS RELATIVE PERCENT: 2.5 %
HEMATOCRIT: 33 % — ABNORMAL LOW (ref 41.0–53.0)
HEMOGLOBIN: 10.1 g/dL — ABNORMAL LOW (ref 13.5–17.5)
LARGE UNSTAINED CELLS: 1 % (ref 0–4)
LYMPHOCYTES ABSOLUTE COUNT: 1 10*9/L — ABNORMAL LOW (ref 1.5–5.0)
LYMPHOCYTES RELATIVE PERCENT: 14.6 %
MEAN CORPUSCULAR HEMOGLOBIN CONC: 30.8 g/dL — ABNORMAL LOW (ref 31.0–37.0)
MEAN CORPUSCULAR HEMOGLOBIN: 28.5 pg (ref 26.0–34.0)
MEAN CORPUSCULAR VOLUME: 92.7 fL (ref 80.0–100.0)
MEAN PLATELET VOLUME: 9.3 fL (ref 7.0–10.0)
MONOCYTES ABSOLUTE COUNT: 0.4 10*9/L (ref 0.2–0.8)
MONOCYTES RELATIVE PERCENT: 6.1 %
NEUTROPHILS ABSOLUTE COUNT: 5.4 10*9/L (ref 2.0–7.5)
NEUTROPHILS RELATIVE PERCENT: 75.1 %
PLATELET COUNT: 195 10*9/L (ref 150–440)
RED BLOOD CELL COUNT: 3.56 10*12/L — ABNORMAL LOW (ref 4.50–5.90)
RED CELL DISTRIBUTION WIDTH: 16.4 % — ABNORMAL HIGH (ref 12.0–15.0)
WBC ADJUSTED: 7.1 10*9/L (ref 4.5–11.0)

## 2020-02-26 LAB — PHOSPHORUS: PHOSPHORUS: 4.9 mg/dL (ref 2.4–5.1)

## 2020-02-26 LAB — MAGNESIUM: MAGNESIUM: 1.7 mg/dL (ref 1.6–2.6)

## 2020-02-26 LAB — SLIDE REVIEW

## 2020-02-26 LAB — APTT
APTT: 53.3 s — ABNORMAL HIGH (ref 24.9–36.9)
HEPARIN CORRELATION: 0.3

## 2020-02-26 MED ORDER — LEVEMIR FLEXTOUCH U-100 INSULIN 100 UNIT/ML (3 ML) SUBCUTANEOUS PEN
Freq: Every evening | SUBCUTANEOUS | 3 refills | 50.00000 days | Status: SS
Start: 2020-02-26 — End: ?
  Filled 2020-03-02: qty 15, 50d supply, fill #0

## 2020-02-26 MED ORDER — MIRTAZAPINE 7.5 MG TABLET
ORAL_TABLET | Freq: Every evening | ORAL | 0 refills | 30.00000 days | Status: CP
Start: 2020-02-26 — End: 2020-03-16
  Filled 2020-03-02: qty 30, 30d supply, fill #0

## 2020-02-26 MED ORDER — EMPAGLIFLOZIN 10 MG TABLET
ORAL_TABLET | Freq: Every morning | ORAL | 3 refills | 30.00000 days | Status: CP
Start: 2020-02-26 — End: 2020-03-28
  Filled 2020-03-02: qty 30, 30d supply, fill #0

## 2020-02-26 MED ORDER — NITROGLYCERIN 0.4 MG SUBLINGUAL TABLET
ORAL_TABLET | SUBLINGUAL | 3 refills | 1.00000 days | Status: CN | PRN
Start: 2020-02-26 — End: ?

## 2020-02-26 MED ADMIN — insulin lispro (HumaLOG) injection 0-12 Units: 0-12 [IU] | SUBCUTANEOUS | @ 02:00:00 | Stop: 2020-02-26

## 2020-02-26 MED ADMIN — isosorbide mononitrate (IMDUR) 24 hr tablet 60 mg: 60 mg | ORAL | @ 13:00:00 | Stop: 2020-02-26

## 2020-02-26 MED ADMIN — acetaminophen (TYLENOL) tablet 1,000 mg: 1000 mg | ORAL | @ 02:00:00 | Stop: 2020-02-26

## 2020-02-26 MED ADMIN — apixaban (ELIQUIS) tablet 5 mg: 5 mg | ORAL | @ 13:00:00 | Stop: 2020-02-26

## 2020-02-26 MED ADMIN — ezetimibe (ZETIA) tablet 10 mg: 10 mg | ORAL | @ 02:00:00 | Stop: 2020-02-26

## 2020-02-26 MED ADMIN — insulin glargine (LANTUS) injection 20 Units: 20 [IU] | SUBCUTANEOUS | @ 02:00:00 | Stop: 2020-02-26

## 2020-02-26 MED ADMIN — insulin lispro (HumaLOG) injection 0-12 Units: 0-12 [IU] | SUBCUTANEOUS | @ 17:00:00 | Stop: 2020-02-26

## 2020-02-26 MED ADMIN — insulin lispro (HumaLOG) injection 12 Units: 12 [IU] | SUBCUTANEOUS | @ 17:00:00 | Stop: 2020-02-26

## 2020-02-26 MED ADMIN — gabapentin (NEURONTIN) capsule 300 mg: 300 mg | ORAL | @ 13:00:00 | Stop: 2020-02-26

## 2020-02-26 MED ADMIN — sevelamer (RENVELA) tablet 800 mg: 800 mg | ORAL | @ 13:00:00 | Stop: 2020-02-26

## 2020-02-26 MED ADMIN — insulin lispro (HumaLOG) injection 12 Units: 12 [IU] | SUBCUTANEOUS | Stop: 2020-02-26

## 2020-02-26 MED ADMIN — ticagrelor (BRILINTA) tablet 90 mg: 90 mg | ORAL | @ 13:00:00 | Stop: 2020-02-26

## 2020-02-26 MED ADMIN — insulin lispro (HumaLOG) injection 0-12 Units: 0-12 [IU] | SUBCUTANEOUS | @ 12:00:00 | Stop: 2020-02-26

## 2020-02-26 MED ADMIN — gentamicin-sodium citrate lock solution in NS: 2 mL | @ 01:00:00 | Stop: 2020-02-26

## 2020-02-26 MED ADMIN — bumetanide (BUMEX) tablet 2 mg: 2 mg | ORAL | @ 10:00:00 | Stop: 2020-02-26

## 2020-02-26 MED ADMIN — sevelamer (RENVELA) tablet 800 mg: 800 mg | ORAL | @ 17:00:00 | Stop: 2020-02-26

## 2020-02-26 MED ADMIN — metoprolol succinate (TOPROL-XL) 24 hr tablet 100 mg: 100 mg | ORAL | @ 13:00:00 | Stop: 2020-02-26

## 2020-02-26 MED ADMIN — insulin lispro (HumaLOG) injection 12 Units: 12 [IU] | SUBCUTANEOUS | @ 13:00:00 | Stop: 2020-02-26

## 2020-02-26 MED ADMIN — amiodarone (PACERONE) tablet 200 mg: 200 mg | ORAL | @ 13:00:00 | Stop: 2020-02-26

## 2020-02-26 MED ADMIN — heparin 25,000 Units/250 mL (100 units/mL) in 0.45% saline infusion (premade): 12 [IU]/kg/h | INTRAVENOUS | @ 10:00:00 | Stop: 2020-02-26

## 2020-02-26 MED ADMIN — magnesium oxide (MAG-OX) tablet 400 mg: 400 mg | ORAL | @ 14:00:00 | Stop: 2020-02-26

## 2020-02-26 MED ADMIN — pantoprazole (PROTONIX) EC tablet 40 mg: 40 mg | ORAL | @ 13:00:00 | Stop: 2020-02-26

## 2020-02-26 MED ADMIN — acetaminophen (TYLENOL) tablet 1,000 mg: 1000 mg | ORAL | @ 10:00:00 | Stop: 2020-02-26

## 2020-02-26 MED ADMIN — ticagrelor (BRILINTA) tablet 90 mg: 90 mg | ORAL | @ 02:00:00 | Stop: 2020-02-26

## 2020-02-26 MED ADMIN — gabapentin (NEURONTIN) capsule 300 mg: 300 mg | ORAL | @ 02:00:00 | Stop: 2020-02-26

## 2020-02-26 NOTE — Unmapped (Signed)
HEMODIALYSIS NURSE PROCEDURE NOTE       Treatment Number:  2 Room / Station:   [id number])    Procedure Date:  02/25/20 Device Name/Number: roger    Total Dialysis Treatment Time:  210 Min.    CONSENT:    Written consent was obtained prior to the procedure and is detailed in the medical record.  Prior to the start of the procedure, a time out was taken and the identity of the patient was confirmed via name, medical record number and date of birth.     WEIGHT:  Hemodialysis Pre-Treatment Weights     Date/Time Pre-Treatment Weight (kg) Estimated Dry Weight (kg) Patient Goal Weight (kg) Total Goal Weight (kg)    02/25/20 1700 ???  utw 155 kg (341 lb 11.4 oz) 2 kg (4 lb 6.6 oz) 2.55 kg (5 lb 10 oz)    02/25/20 1024 163.1 kg (359 lb 9.1 oz) 155.5 kg (342 lb 13 oz) 3 kg (6 lb 9.8 oz) 3.55 kg (7 lb 13.2 oz)         Hemodialysis Post Treatment Weights     Date/Time Post-Treatment Weight (kg) Treatment Weight Change (kg)    02/25/20 2100 ???  utw ???        Active Dialysis Orders (168h ago, onward)     Start     Ordered    02/25/20 1545  Hemodialysis inpatient  Every Tue,Thu,Sat      Question Answer Comment   K+ 2 meq/L    Ca++ 2.5 meq/L    Bicarb 35 meq/L    Na+ 137 meq/L    Na+ Modeling No    Dialyzer [id number]    Dialysate Temperature (C) 36    BFR-As tolerated to a maximum of: Other (please specify) 350 mL/min   DFR 800 mL/min    Duration of treatment 3.5 Hr    Dry weight (kg) 155 kg    Challenge dry weight (kg) No    Fluid removal (L) 2L as tolerated    Tubing Adult = 142 ml    Access Site Dialysis Catheter    Access Site Location Right    Keep SBP >: 100        02/25/20 1544    02/25/20 1130  CRRT Orders - NxStage (Adult)  Continuous        Comments: Fluid removal parameters:   MAP <60: 10 ml/hr;  MAP 61 - 65: 10 ml/hr;   MAP 66 - 70: 50 ml/hr;  MAP >70: 100 ml/hr   Question Answer Comment   CRRT System: NxStage    Modality: CVVH    Access: Right Internal Jugular    BFR (mL/min): 200-350    Dialysate Flow Rate (mL/kg/hr): Other (Specify) 4L/hr       02/25/20 1133              ASSESSMENT:  General appearance: alert  Neurologic: Grossly normal  Lungs: diminished  Heart: regular rate and rhythm, S1, S2 normal, no murmur, click, rub or gallop  Abdomen: soft, non-tender; bowel sounds normal; no masses,  no organomegaly  Pulses: 2+ and symmetric.  Skin: Skin color, texture, turgor normal. No rashes or lesions    ACCESS SITE:       Hemodialysis Catheter 07/27/19 Right Internal jugular 2.2 mL 2.3 mL (Active)   Site Assessment Clean;Dry;Intact 02/25/20 2100   Proximal Lumen Status / Patency Blood Return - Brisk 02/25/20 2100   Proximal Lumen Intervention Deaccessed 02/25/20 2100   Medial Lumen  Status / Patency Blood Return - Brisk 02/25/20 2100   Medial Lumen Intervention Deaccessed 02/25/20 2100   Dressing Intervention No intervention needed 02/25/20 2100   Dressing Status      Clean;Dry;Intact/not removed 02/25/20 1130   Verification by X-ray No 02/25/20 1105   Site Condition No complications 02/25/20 2100   Dressing Type CHG gel;Occlusive;Transparent 02/25/20 2100   Dressing Change Due 02/28/20 02/25/20 2100   Line Necessity Reviewed? Y 02/25/20 2100   Line Necessity Indications Yes - Hemodialysis 02/25/20 2100   Line Necessity Reviewed With Neph 02/25/20 1130           Catheter fill volumes:    Arterial: 2.2 mL Venous: 2.3 mL   Catheter filled with gent citrate post procedure.     Patient Lines/Drains/Airways Status     Active Peripheral & Central Intravenous Access     Name Placement date Placement time Site Days    Peripheral IV 02/23/20 Right Antecubital 02/23/20  1900  Antecubital  2    Peripheral IV 02/25/20 Anterior;Right Forearm 02/25/20  1956  Forearm  less than 1               LAB RESULTS:  Lab Results   Component Value Date    NA 137 02/25/2020    K 4.1 02/25/2020    CL 104 02/25/2020    CO2 25.0 02/25/2020    BUN 30 (H) 02/25/2020    CREATININE 5.96 (H) 02/25/2020    GLU 100 02/25/2020    GLUF 223 (H) 06/09/2014    CALCIUM 8.1 (L) 02/25/2020    CAION 4.31 (L) 02/25/2020    PHOS 4.9 02/25/2020    MG 1.7 02/25/2020    PTH 403.4 (H) 07/25/2019    IRON 32 (L) 07/25/2019    LABIRON 11 (L) 07/25/2019    TRANSFERRIN 223.8 07/25/2019    FERRITIN 150.0 07/25/2019    TIBC 282.0 07/25/2019     Lab Results   Component Value Date    WBC 8.4 02/25/2020    HGB 10.6 (L) 02/25/2020    HCT 34.0 (L) 02/25/2020    PLT 224 02/25/2020    PHART 7.31 (L) 04/07/2017    PO2ART 124.0 (H) 04/07/2017    PCO2ART 44.3 04/07/2017    HCO3ART 22 04/07/2017    BEART -3.4 (L) 04/07/2017    O2SATART 98.9 04/07/2017    APTT 54.8 (H) 02/25/2020        VITAL SIGNS:   Temperature     Date/Time Temp Temp src       02/25/20 2040 36.5 ??C (97.7 ??F) Oral     02/25/20 1710 36.5 ??C (97.7 ??F) Oral         Hemodynamics     Date/Time Pulse BP MAP (mmHg) Patient Position    02/25/20 2100 70 150/70 ??? Lying    02/25/20 2045 70 162/75 ??? Lying    02/25/20 2040 70 162/74 ??? Lying    02/25/20 2030 70 161/79 ??? Lying    02/25/20 2015 70 145/64 ??? Lying    02/25/20 2000 70 137/54 ??? Lying    02/25/20 1945 70 136/57 ??? Lying    02/25/20 1930 70 145/69 ??? Lying    02/25/20 1915 70 154/110 ??? Lying    02/25/20 1900 70 155/84 108 Lying    02/25/20 1845 70 173/95 ??? Lying    02/25/20 1830 70 169/94 ??? Lying    02/25/20 1815 70 176/97 ??? Lying    02/25/20 1800 70  paced 156/96 ??? Lying    02/25/20 1745 70 176/101 ??? Lying    02/25/20 1730 70 163/99 ??? Lying    02/25/20 1715 70 160/103 ??? Lying    02/25/20 1710 70 173/102 ??? Lying        Blood Volume Monitor     Date/Time Blood Volume Change (%) HCT HGB Critline O2 SAT %    02/25/20 2040 -13.5 % ??? ??? ???    02/25/20 2030 -13.3 % ??? ??? ???    02/25/20 2015 -13 % ??? ??? ???    02/25/20 2000 -13.5 % ??? ??? ???    02/25/20 1945 -13 % ??? ??? ???    02/25/20 1930 -12.5 % ??? ??? ???    02/25/20 1915 -12 % ??? ??? ???    02/25/20 1900 -12.6 % ??? ??? ???    02/25/20 1830 -9.6 % ??? ??? ???    02/25/20 1815 -9.8 % ??? ??? ???    02/25/20 1800 -10.6 % ??? ??? ???    02/25/20 1745 -6.2 % ??? ??? ???    02/25/20 1730 -2.6 % ??? ??? ???    02/25/20 1715 -1.5 % ??? ??? ???    02/25/20 1710 0 % ??? ??? ???        Oxygen Therapy     Date/Time Resp SpO2 O2 Device FiO2 (%) O2 Flow Rate (L/min)    02/25/20 2100 20 93 % ??? ??? ???    02/25/20 2045 20 94 % ??? ??? ???    02/25/20 2040 19 95 % ??? ??? ???    02/25/20 2030 22 94 % ??? ??? ???    02/25/20 2015 19 99 % ??? ??? ???    02/25/20 2000 9 100 % ??? ??? ???    02/25/20 1945 20 99 % ??? ??? ???    02/25/20 1930 16 100 % ??? ??? ???    02/25/20 1915 18 100 % ??? ??? ???    02/25/20 1900 20 100 % ??? ??? ???    02/25/20 1845 18 99 % ??? ??? ???    02/25/20 1830 17 100 % ??? ??? ???    02/25/20 1815 13 100 % ??? ??? ???    02/25/20 1800 10 100 % ??? ??? ???    02/25/20 1745 10 100 % ??? ??? ???    02/25/20 1730 15 100 % ??? ??? ???    02/25/20 1715 21 99 % ??? ??? ???    02/25/20 1710 25 100 % ??? ??? ???          Pre-Hemodialysis Assessment     Date/Time Therapy Number Dialyzer Hemodialysis Line Type All Machine Alarms Passed    02/25/20 1700 2 F-180 (98 mLs) Adult (142 m/s) Yes    02/25/20 1024 1 F-180 (98 mLs) Adult (142 m/s) Yes    Date/Time Air Detector Saline Line Double Clampled Hemo-Safe Applied Dialysis Flow (mL/min)    02/25/20 1700 Engaged ??? ??? 800 mL/min    02/25/20 1024 Engaged ??? ??? 800 mL/min    Date/Time Verify Priming Solution Priming Volume Hemodialysis Independent pH Hemodialysis Machine Conductivity (mS/cm)    02/25/20 1700 0.9% NS 300 mL ??? 13.5 mS/cm    02/25/20 1024 0.9% NS 300 mL ???  passed 13.8 mS/cm    Date/Time Hemodialysis Independent Conductivity (mS/cm) Bicarb Conductivity Residual Bleach Negative Total Chlorine    02/25/20 1700 13.5 mS/cm -- Yes 0    02/25/20 1024 13.7 mS/cm -- Yes  0        Pre-Hemodialysis Treatment Comments     Date/Time Pre-Hemodialysis Comments    02/25/20 1700 Alert; stable    02/25/20 1024 Alert and stable        Hemodialysis Treatment     Date/Time Blood Flow Rate (mL/min) Arterial Pressure (mmHg) Venous Pressure (mmHg) Transmembrane Pressure (mmHg)    02/25/20 2040 0 mL/min 0 mmHg 0 mmHg 0 mmHg    02/25/20 2030 300 mL/min -130 mmHg 110 mmHg 10 mmHg    02/25/20 2015 300 mL/min -120 mmHg 110 mmHg 50 mmHg    02/25/20 2000 300 mL/min -120 mmHg 140 mmHg 50 mmHg    02/25/20 1945 300 mL/min -110 mmHg 130 mmHg 50 mmHg    02/25/20 1930 300 mL/min -120 mmHg 110 mmHg 50 mmHg    02/25/20 1915 300 mL/min -120 mmHg 110 mmHg 50 mmHg    02/25/20 1900 300 mL/min -130 mmHg 130 mmHg 50 mmHg    02/25/20 1845 300 mL/min -120 mmHg 130 mmHg 50 mmHg    02/25/20 1830 300 mL/min -110 mmHg 130 mmHg 50 mmHg    02/25/20 1815 300 mL/min -90 mmHg 110 mmHg 50 mmHg    02/25/20 1800 300 mL/min -100 mmHg 120 mmHg 50 mmHg    02/25/20 1745 300 mL/min -100 mmHg 120 mmHg 50 mmHg    02/25/20 1730 250 mL/min -80 mmHg 90 mmHg 50 mmHg    02/25/20 1715 250 mL/min -60 mmHg 70 mmHg 40 mmHg    02/25/20 1710 250 mL/min -60 mmHg 110 mmHg 40 mmHg    02/25/20 1045 350 mL/min -136 mmHg 142 mmHg 50 mmHg    02/25/20 1030 150 mL/min 56 mmHg 76 mmHg 15 mmHg    Date/Time Ultrafiltration Rate (mL/hr) Ultrafiltrate Removed (mL) Dialysate Flow Rate (mL/min) KECN (Kecn)    02/25/20 2040 0 mL/hr 2550 mL 0 ml/min ???    02/25/20 2030 750 mL/hr 2410 mL 800 ml/min ???    02/25/20 2015 750 mL/hr 2225 mL 800 ml/min ???    02/25/20 2000 750 mL/hr 2052 mL 800 ml/min ???    02/25/20 1945 750 mL/hr 1865 mL 800 ml/min ???    02/25/20 1930 750 mL/hr 1696 mL 800 ml/min ???    02/25/20 1915 750 mL/hr 1500 mL 800 ml/min ???    02/25/20 1900 750 mL/hr 1355 mL 800 ml/min ???    02/25/20 1845 730 mL/hr 1154 mL 800 ml/min ???    02/25/20 1830 730 mL/hr 975 mL 800 ml/min ???    02/25/20 1815 730 mL/hr 788 mL 800 ml/min ???    02/25/20 1800 730 mL/hr 605 mL 800 ml/min ???    02/25/20 1745 730 mL/hr 425 mL 800 ml/min ???    02/25/20 1730 730 mL/hr 245 mL 800 ml/min ???    02/25/20 1715 730 mL/hr 27 mL 800 ml/min ???    02/25/20 1710 730 mL/hr 0 mL 800 ml/min ???    02/25/20 1045 890 mL/hr 188 mL 800 ml/min ???    02/25/20 1030 0 mL/hr 100 mL 800 ml/min ???        Hemodialysis Treatment Comments     Date/Time Intra-Hemodialysis Comments    02/25/20 2100 post return BP    02/25/20 2045 post return BP    02/25/20 2040 VSS;NAD    02/25/20 2030 NAD;EEG at bedside    02/25/20 2015 NAD;EEG at bedside    02/25/20 2000 NAD;EEG at bedside    02/25/20 1945 NAD    02/25/20 1930  NAD    02/25/20 1915 NAD    02/25/20 1900 NAD    02/25/20 1845 NAD    02/25/20 1830 NAD    02/25/20 1815 NAD    02/25/20 1800 NAD    02/25/20 1745 NAD    02/25/20 1730 NAD    02/25/20 1715 NAD    02/25/20 1710 NAD    02/25/20 1045 pt complaints of Difficulty of breathing, difficult to arrouse, put patient to non rebreather mask, Reinfused blood ,Rapid  response activated,    02/25/20 1030 HD initiated , machine alarm limits within normal range        Post Treatment     Date/Time Rinseback Volume (mL) On Line Clearance: spKt/V Total Liters Processed (L/min) Dialyzer Clearance    02/25/20 2100 300 mL 0.51 spKt/V 60 L/min Moderately streaked        Post Hemodialysis Treatment Comments     Date/Time Post-Hemodialysis Comments    02/25/20 2100 VSS; NAD        Hemodialysis I/O     Date/Time Total Hemodialysis Replacement Volume (mL) Total Ultrafiltrate Output (mL)    02/25/20 2100 ??? 2000 mL          [id number] - Medicaitons Given During Treatment  (last 4 hrs)         Manfred Arch, RN       Medication Name Action Time Action Route Rate Dose User     acetaminophen (TYLENOL) tablet 1,000 mg 02/25/20 1736 Given Oral  1,000 mg Manfred Arch, RN     atorvastatin (LIPITOR) tablet 80 mg 02/25/20 1736 Given Oral  80 mg Manfred Arch, RN     heparin 25,000 Units/250 mL (100 units/mL) in 0.45% saline infusion (premade) 02/25/20 1800 Rate/Dose Verify Intravenous 20.87 mL/hr 13 Units/kg/hr Manfred Arch, RN     heparin 25,000 Units/250 mL (100 units/mL) in 0.45% saline infusion (premade) 02/25/20 1900 Rate/Dose Verify Intravenous 20.87 mL/hr 13 Units/kg/hr Manfred Arch, RN     heparin 25,000 Units/250 mL (100 units/mL) in 0.45% saline infusion (premade) 02/25/20 1927 Handoff Intravenous 20.87 mL/hr 13 Units/kg/hr Manfred Arch, RN     insulin lispro (HumaLOG) injection 0-12 Units 02/25/20 1730 Not Given Subcutaneous   Manfred Arch, RN     insulin lispro (HumaLOG) injection 12 Units 02/25/20 1737 Hold Subcutaneous   Manfred Arch, RN     insulin lispro (HumaLOG) injection 12 Units 02/25/20 1929 Given Subcutaneous  12 Units Manfred Arch, RN     sevelamer (RENVELA) tablet 800 mg 02/25/20 1736 Given Oral  800 mg Manfred Arch, RN          Mariann Laster, RN       Medication Name Action Time Action Route Rate Dose User     ezetimibe (ZETIA) tablet 10 mg 02/25/20 2051 Given Oral  10 mg Mariann Laster, RN     gabapentin (NEURONTIN) capsule 300 mg 02/25/20 2051 Given Oral  300 mg Mariann Laster, RN     insulin glargine (LANTUS) injection 20 Units 02/25/20 2052 Given Subcutaneous  20 Units Mariann Laster, RN     insulin lispro (HumaLOG) injection 0-12 Units 02/25/20 2052 Given Subcutaneous  2 Units Mariann Laster, RN     ticagrelor [facility]) tablet 90 mg 02/25/20 2051 Given Oral  90 mg Mariann Laster, RN          Rob Hickman, RN       Medication Name Action Time Action Route Rate Dose User  epoetin alfa-EPBx (RETACRIT) injection 4,000 Units 02/25/20 1724 Given Intravenous  4,000 Units Rob Hickman, RN     gentamicin-sodium citrate lock solution in NS 02/25/20 2025 Given hemodialysis port injection  2 mL Rob Hickman, RN     gentamicin-sodium citrate lock solution in NS 02/25/20 2025 Given hemodialysis port injection  2 mL Rob Hickman, RN                  Patient tolerated treatment in a  Bed.

## 2020-02-26 NOTE — Unmapped (Signed)
Physician Discharge Summary [facility]  TICU [facility]  [address]  [place] [address]  Dept: [phone]  Loc: [phone]     Identifying Information:   Jeremy Oliver  1970/07/06  [id number]    Primary Care Physician: Kurtis Bushman, MD     Code Status: Full Code    Admit Date: 02/23/2020    Discharge Date: 02/26/2020     Discharge To: Home    Discharge Service: [facility] - Cardiology Floor Team 1 (MEDC1)     Discharge Attending Physician: Charna Archer, MD    Discharge Diagnoses:  Principal Problem:    NSTEMI (non-ST elevation myocardial infarction) (CMS-[name]) POA: Yes  Active Problems:    Hypercholesteremia POA: Yes    Diabetes mellitus with coincident hypertension (CMS-[name]) POA: Yes    Obesity, Class III, BMI 40-49.9 (morbid obesity) (CMS-[name]) POA: Yes    GERD (gastroesophageal reflux disease) POA: Yes    Paroxysmal atrial fibrillation (CMS-[name]) POA: Yes    OSA (obstructive sleep apnea) POA: Yes    Tobacco use disorder POA: Yes    HFrEF (heart failure with reduced ejection fraction) (CMS-[name]) POA: Yes    ESRD needing dialysis (CMS-[name]) POA: Yes  Resolved Problems:    * No resolved hospital problems. 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 Pt asked to be restarted on meds for anxiety and was prescribed low dose Remeron for now. Recommend continued f/u  [ ]  recomment f/u CT in 3 months for eval of dependent GGOs in the posterior RUL   [ ]  started on Jardiance this hospitalization. Recommend titration of GDMT for HFrEF as tolarated    Hospital Course:   Jeremy Oliver is a 49 y.o. male with PMHx of of ESRD on dialysis ([name]), CAD s/p multiple PCIs (last 12/2019 on Brillinta/Eliquis), hx of VT after STEMI in 2018 s/p PM/ICD, T2DM, HTN, HLD, HFrEF (EF 35-40%), TIA, A-fib (on apixaban), OSA on CPAP, and bilateral DVTs who presents for evaluation of chest pain and noted to be in A. fib with RVR with NSTEMI.  ARRT called on 11/27 while patient was in dialysis for AMS and acute onset dyspnea. Vitals were all stable, but given ill-appearance at the time, he was transferred to the CICU for further management. He underwent w/u for AMS, stroke and seizure, and all was reassuring. Wtihin 24 hours, he had tolerated repeat dialysis and done well in the ICU and was therefore transferred back to a floor team on 11/28.      Acute SOB - Acute AMS c/f pseudoseizures vs panic attack  On 11/27 patient acutely SOB and non-responsive during hemodialysis requiring with a code called but no resuscitation needed as patient quickly responded. CBC,CMP, VBG at baseline. Glucose normal. BNP and CXR pending. Upon transfer to CICU, patient no longer SOB and vitals stable with sat 99 on 2L [place]. CXR and non-con CT chest without acute findings. CTH NL. Patient had three more episodes during the day- each time patient becomes unresponsive with normal vitals. He quickly returns to his baseline. Given quick return to baseline could have also been anxiety attack vs pseudoseizure. 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 PVLs negative. Patient noted worsening anxiety and racing thoughts prior to discharge, and asked to be restarted on medications for  mood symptoms. Previously managed by his PCP. Given lack of follow-up with our team, we have prescribed him very low dose Remeron at the time of discharge. We recommend attention to follow-up regarding these issues and ongoing medication management for anxiety and depression.      Chest Pain - NSTEMI - Multivessel CAD s/p multiple PCI    Current chest pain likely secondary to demand in the setting of A. fib with RVR and severe CAD (see cardiac history below) versus thrombotic event, which would be less likely on his Brilinta and Eliquis. EKG showing ST depressions in V2???V6 which largely resolved with rate control.  Troponin 140>>422>>3593 and peaked at 6076 on 11/26. Not a PCI candidate during this hospitalization given advanced nature of his disease. He was managed medically. Patient and wife expressed understanding regarding the severity of his disease, however also expressed emotional distress over his health. Would recommend outpatient palliative care referral for further support and discussions regarding future desires.      Coronary history: S/p STEMI in 2018 with VT arrest leading to ICD placement and DES to LAD.  NSTEMI in 02/2018 with DES x2 to mid RCA and RPLA/RPDA bifurcation.  NSTEMI in 05/2018 with PCI to proximal/mid LAD.  NSTEMI in 09/2018 with PCI to distal RCA.  On 07/2019 had DES x2 to the LAD and left circumflex as well as balloon angioplasty of diagonal 2.  S/p LHC at [facility] on 01/25/2020 noting severe multivessel disease without intervention given high risk. Transferred to [facility] for consideration of high risk PCI.  On 10/29, patient underwent PCI to the LMCA/LAD with 3.0 x 16 mm Synergy DES.      Paroxysmal a-fib  Longstanding history on amiodarone and apixaban at home.  Was in A. fib with RVR on presentation. Was continued on home meds.     HFrEF - HTN  Appears well compensated at this time. Echo from 10/28 showing LVEF 35-40%.  Makes pretty good urine even with ESRD. Wsa restarted on jardiance while hospitalized. Would recommend further titration and addition of GDMT as able. Has been limited in the past 2/2 side effects and hypotension w/ dialysis.      ESRD on iHD [name]: dialyzed while hospitalized.      Normocytic anemia  At baseline currently; consistent with anemia of chronic renal disease.     T2DM: Home regimen of reported to be aspart 15 units with meals and Levemir 25 units nightly. He was continued on this regimen at discharge.         Touchbase with Outpatient Provider:  Warm Handoff: Completed on 02/26/20 by Mart Piggs (Resident) via [facility]    Procedures:  None  No admission procedures for hospital encounter.  ______________________________________________________________________  Discharge Medications:     Your Medication List      STOP taking these medications    hydrOXYzine 10 MG tablet  Commonly known as: ATARAX        START taking these medications    empagliflozin 10 mg tablet  Commonly known as: JARDIANCE  Take 1 tablet (10 mg total) by mouth every morning.     mirtazapine 7.5 MG tablet  Commonly known as: REMERON  Take 1 tablet (7.5 mg total) by mouth nightly.        CHANGE how you take these medications    LEVEMIR FLEXTOUCH U-100 INSULN 100 unit/mL (3 mL) injection pen  Generic drug: insulin detemir U-100  Inject 0.3 mL (30 Units total) under the skin nightly. Inject  37 units total under the skin nightly  What changed:   ?? how much to take  ?? how to take this  ?? when to take this        CONTINUE taking these medications    acetaminophen 500 MG tablet  Commonly known as: TYLENOL  TAKE 2 TABLETS (1000MG ) BY MOUTH EVERY 8 HOURS AS NEEDED FOR PAIN     allopurinoL 100 MG tablet  Commonly known as: ZYLOPRIM  Take 2 tablets (200 mg total) by mouth daily.     amiodarone 200 MG tablet  Commonly known as: PACERONE  TAKE 1 TABLET BY MOUTH ONCE DAILY     atorvastatin 80 MG tablet  Commonly known as: LIPITOR  Take 1 tablet (80 mg total) by mouth daily.     BLOOD PRESSURE CUFF Misc  Generic drug: miscellaneous medical supply  Measure BP once a day or if any symptoms     blood sugar diagnostic Strp  Use as directed to check blood glucose Four (4) times a day (before meals and nightly).     blood-glucose meter kit  Disp. blood glucose meter kit preferred by patient's insurance. Dx: Diabetes, E11.9     bumetanide 2 MG tablet  Commonly known as: BUMEX  Take 1 tablet (2 mg total) by mouth two (2) times a day. On non dialysis days     cholecalciferol (vitamin D3 25 mcg (1,000 units)) 1,000 unit (25 mcg) tablet  Take 1 tablet (25 mcg total) by mouth daily.     diclofenac sodium 1 % gel  Commonly known as: VOLTAREN  Apply 2 grams topically Three (3) times a day.     ELIQUIS 5 mg Tab  Generic drug: apixaban  Take 1 tablet (5 mg total) by mouth Two (2) times a day.     empty container Misc  Commonly known as: SHARPS-A-GATOR DISPOSAL SYSTEM  Use as directed for sharps disposal     ezetimibe 10 mg tablet  Commonly known as: ZETIA  TAKE 1 TABLET BY MOUTH ONCE DAILY     FLUoxetine 40 MG capsule  Commonly known as: PROzac  Take 2 capsules (80 mg total) by mouth daily.     gabapentin 300 MG capsule  Commonly known as: NEURONTIN  Take 1 capsule (300 mg total) by mouth two (2) times a day.     insulin syringe-needle U-100 0.3 mL 31 gauge x 5/16 (8 mm) Syrg  Use to inject insulin twice daily with meals     isosorbide mononitrate 60 MG 24 hr tablet  Commonly known as: IMDUR  Take 1 tablet (60 mg total) by mouth daily. DO NOT TAKE on dialysis days     lancets Misc  Use as directed to check blood glucose Four (4) times a day (before meals and nightly).     lancets Misc  Disp. lancets #200 or amount allowed, Testing QID, Dx: E11.65, Z79.4. (DM type 2 , Insulin Dep)     loperamide 2 mg capsule  Commonly known as: IMODIUM  Take 1 capsule twice a day and another capsule after each episode of diarrhea, up to 8 pills per day.     metoprolol succinate 100 MG 24 hr tablet  Commonly known as: TOPROL XL  Take 1 tablet (100 mg total) by mouth daily.     nitroglycerin 0.4 mg/dose spray  Commonly known as: NITROLINGUAL  Place 1 spray under the tongue every five (5) minutes as needed for chest pain. pT MUST HAVE SPRAY DUE  TO DRY MOUTH AND PILLS DO NOT DISSOLVE     NovoLOG Flexpen U-100 Insulin 100 unit/mL (3 mL) injection pen  Generic drug: insulin ASPART  Inject 15 Units under the skin Three (3) times a day before meals.     omeprazole 40 MG capsule  Commonly known as: PriLOSEC  Take 1 capsule (40 mg total) by mouth Two (2) times a day (30 minutes before a meal). ondansetron 4 MG tablet  Commonly known as: ZOFRAN  Take 1 tablet (4 mg total) by mouth every twelve (12) hours as needed.     OZEMPIC 0.25 mg or 0.5 mg(2 mg/1.5 mL) Pnij injection  Generic drug: semaglutide  Inject 0.5 mg under the skin every seven (7) days.     PRALUENT PEN 150 mg/mL subcutaneous injection  Generic drug: alirocumab  Inject the contents of 1 pen (150 mg total) under the skin every fourteen (14) days.     RENVELA 800 mg tablet  Generic drug: sevelamer  TAKE 2 TABLETS (1600MG ) BY MOUTH THREE TIMES DAILY WITH MEALS     ticagrelor 90 mg Tab  Commonly known as: BRILINTA  TAKE 1 TABLET BY MOUTH TWICE DAILY     UNIFINE PENTIPS 31 gauge x 5/16 (8 mm) Ndle  Generic drug: pen needle, diabetic  USE WITH INSULIN AND VICTOZA 5 TIMES DAILY     VENTOLIN HFA 90 mcg/actuation inhaler  Generic drug: albuterol  Inhale 2 puffs into the lungs every 6 (six) hours as needed for wheezing or shortness of breath.            Allergies:  Losartan, Morphine, and Opioids - morphine analogues  ______________________________________________________________________  Pending Test Results (if blank, then none):  Pending Labs     Order Current Status    CBC w/ Differential In process    Hepatitis B Surface Antigen In process    Morphology Review In process          Most Recent Labs:  All lab results last 24 hours -   Recent Results (from the past 24 hour(s))   Blood Gas Critical Care Panel, Venous    Collection Time: 02/25/20 11:02 AM   Result Value Ref Range    Specimen Source Venous     FIO2 Venous Not Specified     pH, Venous 7.32 7.32 - 7.43    pCO2, Ven 55 40 - 60 mm Hg    pO2, Ven 24 (L) 30 - 55 mm Hg    HCO3, Ven 28 (H) 22 - 27 mmol/L    Base Excess, Ven 1.6 -2.0 - 2.0    O2 Saturation, Venous 34.5 (L) 40.0 - 85.0 %    Sodium Whole Blood 140 135 - 145 mmol/L    Potassium, Bld 4.2 3.4 - 4.6 mmol/L    Calcium, Ionized Venous 4.31 (L) 4.40 - 5.40 mg/dL    Glucose Whole Blood 176 70 - 179 mg/dL    Lactate, Venous 2.0 (H) 0.5 - 1.8 mmol/L    Hgb, blood gas 10.30 (L) 13.50 - 17.50 g/dL   hsTroponin I (single, no delta)    Collection Time: 02/25/20 11:02 AM   Result Value Ref Range    hsTroponin I 2,206 (HH) <=53 ng/L   Comprehensive Metabolic Panel    Collection Time: 02/25/20 11:02 AM   Result Value Ref Range    Sodium 134 (L) 135 - 145 mmol/L    Potassium 4.1 3.4 - 4.5 mmol/L    Chloride 100 98 - 107  mmol/L    Anion Gap 11 5 - 14 mmol/L    CO2 23.0 20.0 - 31.0 mmol/L    BUN 29 (H) 9 - 23 mg/dL    Creatinine 2.95 (H) 0.60 - 1.10 mg/dL    BUN/Creatinine Ratio 5     EGFR CKD-EPI Non-African American, Male 11 (L) >=60 mL/min/1.59m2    EGFR CKD-EPI African American, Male 13 (L) >=60 mL/min/1.3m2    Glucose 182 (H) 70 - 179 mg/dL    Calcium 8.0 (L) 8.7 - 10.4 mg/dL    Albumin 2.9 (L) 3.4 - 5.0 g/dL    Total Protein 6.6 5.7 - 8.2 g/dL    Total Bilirubin 0.3 0.3 - 1.2 mg/dL    AST 11 <=62 U/L    ALT <7 (L) 10 - 49 U/L    Alkaline Phosphatase 101 46 - 116 U/L   CBC    Collection Time: 02/25/20 11:02 AM   Result Value Ref Range    WBC 8.4 4.5 - 11.0 10*9/L    RBC 3.76 (L) 4.50 - 5.90 10*12/L    HGB 10.6 (L) 13.5 - 17.5 g/dL    HCT 13.0 (L) 86.5 - 53.0 %    MCV 90.6 80.0 - 100.0 fL    MCH 28.2 26.0 - 34.0 pg    MCHC 31.1 31.0 - 37.0 g/dL    RDW 78.4 (H) 69.6 - 15.0 %    MPV 9.0 7.0 - 10.0 fL    Platelet 224 150 - 440 10*9/L   ECG 12 Lead    Collection Time: 02/25/20 11:02 AM   Result Value Ref Range    EKG Systolic BP  mmHg    EKG Diastolic BP  mmHg    EKG Ventricular Rate 70 BPM    EKG Atrial Rate 70 BPM    EKG P-R Interval 258 ms    EKG QRS Duration 100 ms    EKG Q-T Interval 456 ms    EKG QTC Calculation 492 ms    EKG Calculated P Axis -8 degrees    EKG Calculated R Axis -3 degrees    EKG Calculated T Axis 156 degrees    QTC Fredericia 480 ms   POCT Glucose    Collection Time: 02/25/20 11:59 AM   Result Value Ref Range    Glucose, POC 172 70 - 179 mg/dL   ECG 12 Lead    Collection Time: 02/25/20 12:02 PM   Result Value Ref Range    EKG Systolic BP  mmHg    EKG Diastolic BP  mmHg    EKG Ventricular Rate 70 BPM    EKG Atrial Rate 70 BPM    EKG P-R Interval 278 ms    EKG QRS Duration 102 ms    EKG Q-T Interval 460 ms    EKG QTC Calculation 496 ms    EKG Calculated P Axis -9 degrees    EKG Calculated R Axis -4 degrees    EKG Calculated T Axis 165 degrees    QTC Fredericia 484 ms   APTT    Collection Time: 02/25/20 12:59 PM   Result Value Ref Range    APTT 54.8 (H) 24.9 - 36.9 sec    Heparin Correlation 0.3    B-type Natriuretic Peptide    Collection Time: 02/25/20 12:59 PM   Result Value Ref Range    BNP 607 (H) <=100 pg/mL   Basic Metabolic Panel    Collection Time: 02/25/20  4:54 PM   Result Value  Ref Range    Sodium 137 135 - 145 mmol/L    Potassium 4.1 3.4 - 4.5 mmol/L    Chloride 104 98 - 107 mmol/L    CO2 25.0 20.0 - 31.0 mmol/L    Anion Gap 8 5 - 14 mmol/L    BUN 30 (H) 9 - 23 mg/dL    Creatinine 1.61 (H) 0.60 - 1.10 mg/dL    BUN/Creatinine Ratio 5     EGFR CKD-EPI Non-African American, Male 10 (L) >=60 mL/min/1.67m2    EGFR CKD-EPI African American, Male 12 (L) >=60 mL/min/1.26m2    Glucose 100 70 - 179 mg/dL    Calcium 8.1 (L) 8.7 - 10.4 mg/dL   Magnesium Level    Collection Time: 02/25/20  4:54 PM   Result Value Ref Range    Magnesium 1.7 1.6 - 2.6 mg/dL   Phosphorus Level    Collection Time: 02/25/20  4:54 PM   Result Value Ref Range    Phosphorus 4.9 2.4 - 5.1 mg/dL   POCT Glucose    Collection Time: 02/25/20  5:29 PM   Result Value Ref Range    Glucose, POC 88 70 - 179 mg/dL   POCT Glucose    Collection Time: 02/25/20  8:43 PM   Result Value Ref Range    Glucose, POC 156 70 - 179 mg/dL   Basic Metabolic Panel    Collection Time: 02/26/20  4:29 AM   Result Value Ref Range    Sodium 137 135 - 145 mmol/L    Potassium 3.9 3.4 - 4.5 mmol/L    Chloride 102 98 - 107 mmol/L    CO2 27.0 20.0 - 31.0 mmol/L    Anion Gap 8 5 - 14 mmol/L    BUN 20 9 - 23 mg/dL    Creatinine 0.96 (H) 0.60 - 1.10 mg/dL    BUN/Creatinine Ratio 4     EGFR CKD-EPI Non-African American, Male 14 (L) >=60 mL/min/1.85m2    EGFR CKD-EPI African American, Male 16 (L) >=60 mL/min/1.39m2    Glucose 275 (H) 70 - 179 mg/dL    Calcium 7.8 (L) 8.7 - 10.4 mg/dL   Magnesium Level    Collection Time: 02/26/20  4:29 AM   Result Value Ref Range    Magnesium 1.7 1.6 - 2.6 mg/dL   Phosphorus Level    Collection Time: 02/26/20  4:29 AM   Result Value Ref Range    Phosphorus 4.9 2.4 - 5.1 mg/dL   APTT    Collection Time: 02/26/20  4:29 AM   Result Value Ref Range    APTT 53.3 (H) 24.9 - 36.9 sec    Heparin Correlation 0.3    CBC w/ Differential    Collection Time: 02/26/20  4:29 AM   Result Value Ref Range    WBC 7.1 4.5 - 11.0 10*9/L    RBC 3.56 (L) 4.50 - 5.90 10*12/L    HGB 10.1 (L) 13.5 - 17.5 g/dL    HCT 04.5 (L) 40.9 - 53.0 %    MCV 92.7 80.0 - 100.0 fL    MCH 28.5 26.0 - 34.0 pg    MCHC 30.8 (L) 31.0 - 37.0 g/dL    RDW 81.1 (H) 91.4 - 15.0 %    MPV 9.3 7.0 - 10.0 fL    Platelet 195 150 - 440 10*9/L    Neutrophils % 75.1 %    Lymphocytes % 14.6 %    Monocytes % 6.1 %    Eosinophils %  2.5 %    Basophils % 0.7 %    Absolute Neutrophils 5.4 2.0 - 7.5 10*9/L    Absolute Lymphocytes 1.0 (L) 1.5 - 5.0 10*9/L    Absolute Monocytes 0.4 0.2 - 0.8 10*9/L    Absolute Eosinophils 0.2 0.0 - 0.4 10*9/L    Absolute Basophils 0.1 0.0 - 0.1 10*9/L    Large Unstained Cells 1 0 - 4 %    Macrocytosis Slight (A) Not Present    Anisocytosis Slight (A) Not Present    Hypochromasia Marked (A) Not Present   POCT Glucose    Collection Time: 02/26/20  6:35 AM   Result Value Ref Range    Glucose, POC 221 (H) 70 - 179 mg/dL   ECG 12 Lead    Collection Time: 02/26/20  8:12 AM   Result Value Ref Range    EKG Systolic BP  mmHg    EKG Diastolic BP  mmHg    EKG Ventricular Rate 70 BPM    EKG Atrial Rate 535 BPM    EKG P-R Interval  ms    EKG QRS Duration 102 ms    EKG Q-T Interval 472 ms    EKG QTC Calculation 509 ms    EKG Calculated P Axis  degrees    EKG Calculated R Axis -2 degrees    EKG Calculated T Axis 178 degrees    QTC Fredericia 497 ms       Relevant Studies/Radiology (if blank, then none):  see results in Epic  ______________________________________________________________________  Discharge Instructions:       Diet Instructions     Discharge diet (specify)      Discharge Nutrition Therapy: Heart Healthy          Other Instructions     Call MD for:  difficulty breathing, headache or visual disturbances      Call MD for:  persistent dizziness or light-headedness      Call MD for:  persistent nausea or vomiting      Call MD for:  severe uncontrolled pain      Discharge instructions      You were admitted to [facility] for recurrent chest pain. Given the extent of your coronary artery disease, it is likely that you will continue to have this pain intermittently. We highly recommend that you continue to follow-up with your regular primary care provider and your Cardiologist on a regular basis in order to continue maximizing the medications you are on to help reduce your risk for this happening again in the future. At this time, we have not changed any of your medications, but have added a new medication called Jardiance which will help with both your heart health and your diabetes. This medication will be available at your pharmacy for you to pick up and begin at your convenience. We highly recommend you speak with your PCP regarding anxiety and any mood changes you have been experiencing lately. We started you on a low dose medication to help with anxiety at night, but think you would further benefit from conversations and further medication changes with your primary care team.     Please note that we have decreased your long-acting insulin from 37units nightly to 30 units nightly. Please make sure you follow up with your PCP regarding this change as well.     Please carefully read and follow these instructions below upon your discharge:    1) Please take your medications as prescribed and note the changes listed  on your discharge. At future follow-up appointments, please be sure to take all of your medications with you so your provider can better guide your care.     2) Seek medical care with your primary care doctor or local Emergency Room or Urgent Care if you develop any changes in your mental status, worsening abdominal pain, fevers greater than 101.5, any unexplained/unrelieved shortness of breath, uncontrolled nausea and vomiting that keeps you from remaining hydrated or taking your medication, or any other concerning symptoms.     3) Please go to your follow-up appointments. If you do not see an appointment listed below with your primary care doctor, please call your doctor's office as soon as possible to schedule an appointment to be seen within 7-10 days of discharge.     4) If you have any concerns before you are able to follow-up with your primary care doctor, you can reach [place] by calling [phone] and asking to page the Cardiology (or Med C1) resident on call.               Follow Up instructions and Outpatient Referrals     Call MD for:  difficulty breathing, headache or visual disturbances      Call MD for:  persistent dizziness or light-headedness      Call MD for:  persistent nausea or vomiting      Call MD for:  severe uncontrolled pain      Discharge instructions          Appointments which have been scheduled for you    Mar 07, 2020  4:00 PM  (Arrive by 3:45 PM)  RETURN CONTINUITY with Roma Schanz, MD  [facility] INTERNAL MEDICINE [place] [facility] REGION) [address]  [place] [address]  [phone]      Apr 05, 2020  1:30 PM  (Arrive by 1:00 PM)  VASCULAR ACCESS with Leona Carry, MD  [facility] TRANSPLANT SURGERY [place] [facility] REGION) [address]  [place] [address]  [phone]      Apr 06, 2020 11:00 AM  (Arrive by 10:45 AM)  RETURN EP with Joretta Bachelor, MD  [facility] CARDIOLOGY [place] [facility] REGION) [address]  [place] [address]  [phone]      May 01, 2020 12:00 AM  REMOTE ICD FOLLOW-UP with [facility] EP REMOTE MONITORING  [facility] EP REMOTE MONITORING [place] [facility] REGION) [address]  [address] Floor [place] [address]  [phone]   This is a remote appointment, you do not need to come into the office.     May 17, 2020  9:00 AM  (Arrive by 8:45 AM)  ECHOCARDIOGRAM W COLORFLOW SPECTRAL DOPPLER with ET FL 2 ECHO RM 1  IMG ECHO [place] [facility] [place]) [address]  [place] [address]  [phone]      May 17, 2020 10:15 AM  (Arrive by 10:00 AM)  Danelle Earthly RETURN with Marlaine Hind, MD  [facility] CARDIOLOGY [place] [facility] REGION) [address]  [place] [address]  [phone]      Matarazzo 03, 2022 12:00 AM  REMOTE ICD FOLLOW-UP with [facility] EP REMOTE MONITORING  [facility] EP REMOTE MONITORING [place] [facility] REGION) [address]  [address] [place] [address]  [phone]   This is a remote appointment,  you do not need to come into the office.     Oct 30, 2020 12:00 AM  REMOTE ICD FOLLOW-UP with [facility] EP REMOTE MONITORING  [facility] EP REMOTE MONITORING [facility] REGION) [address]  [address] [place] [address]  [phone]   This is a remote appointment, you do not need to come into the office.          ______________________________________________________________________  Discharge Day Services:  BP 132/62  - Pulse 70  - Temp 36.6 ??C (Oral)  - Resp 14  - SpO2 99%     Pt seen on the day of discharge and determined appropriate for discharge.    Condition at Discharge: good    Length of Discharge: I spent greater than 30 mins in the discharge of this patient.

## 2020-02-26 NOTE — Unmapped (Addendum)
CONTINUOUS VIDEO-EEG MONITORING REPORT     Patient: Jeremy Oliver  Date of Birth: [birth year]  Attending: Florian Buff, M.D.  Ordering Provider: Thelma Barge                                 [facility] No:  [id number]    DATE STARTED: 02/25/2020  20:40  DATE ENDED: 02/26/2020  11:36    HISTORY   Per chart, Jeremy Oliver is 49 y.o. 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 cEEG to monitor for sz.    PROCEDURE  Continuous video-EEG was performed while awake utilizing 21 active electrodes placed according to the international 10-20 system.  The study was recorded digitally with a bandpass of 1-70Hz  and a sampling rate of 200Hz  and was reviewed with the possibility of multiple reformatting. The study was digitally processed with potential spike and seizure events identified for physician analysis and review.  Patient recognized events were identified by a push button marker and reviewed by the physician.  Background activity was reviewed from the continuously recorded data. Simultaneous video was reviewed for all patient events.      TECHNICAL DESCRIPTION:    Day 1 (02/25/20 @20 :40 - 02/26/20 @11 :36)    Awake  The record shows a fair  organization at rest, consisting of a 8 Hz, 10-30 uV posterior dominant rhythm with good  reactivity. There is moderate bilateral beta activity. There is occasional intermittent delta/theta slowing seen throughout this study    Sleep  Drowsiness is characterized by attenuation of the background, vertex waves, and bilateral theta slowing. Stage II sleep is characterized by slowing, vertex waves, K-complexes, and symmetric sleep spindles.    Events  None. No push button events    EKG recording shows a regular heart rate of 60-80 bpm.      CLASSIFICATION:   - Intermittent slow, generalized    IMPRESSION   This day 1 of continuous EEG monitoring is consistent with mild encephalopathy. There were no seizures or epileptiform activity. No push buttons

## 2020-02-26 NOTE — Unmapped (Signed)
VENOUS ACCESS ULTRASOUND PROCEDURE NOTE    Indications:   Poor venous access.    The Venous Access Team has assessed this patient for the placement of a PIV. Ultrasound guidance was necessary to obtain access.     Procedure Details:  Identity of the patient was confirmed via name, medical record number and date of birth. The availability of the correct equipment was verified.    The vein was identified for ultrasound catheter insertion.  Field was prepared with necessary supplies and equipment.  Probe cover and sterile gel utilized.  Insertion site was prepped with chlorhexidine solution and allowed to dry.  The catheter extension was primed with normal saline.A(n) 20g x 1.88 inch catheter was placed in the right forearm with 1 attempt(s). See LDA for additional details.    Catheter aspirated, 4 mL blood return present. The catheter was then flushed with 10 mL of normal saline. Insertion site cleansed, and dressing applied per manufacturer guidelines. The catheter was inserted without difficulty  by Michel Santee RN.      RN was notified.     Thank you,     Michel Santee RN Venous Access Team   [phone]     Workup / Procedure Time:  30 minutes    See vein image below:

## 2020-02-26 NOTE — Unmapped (Signed)
CICU Progress Note    Hospital Day: 4    Hospital Course:    11/26: Patient admitted for NSTEMI and afib with RVR  11/27: Patient with acute SOB during dialysis with code called; patient transferred to CICU.   11/28: Patient stable ON, cEEG pending, can transfer to floor    Subjective / Interval History:      No acute ON events. Patient doing well; denies chest pain, abdominal pain, dysuria. Discussed dispo to floor team with possible discharge today.     Assessment/Plan:        Principal Problem:    NSTEMI (non-ST elevation myocardial infarction) (CMS-[name])  Active Problems:    Hypercholesteremia    Diabetes mellitus with coincident hypertension (CMS-[name])    Obesity, Class III, BMI 40-49.9 (morbid obesity) (CMS-[name])    GERD (gastroesophageal reflux disease)    Paroxysmal atrial fibrillation (CMS-[name])    OSA (obstructive sleep apnea)    Tobacco use disorder    HFrEF (heart failure with reduced ejection fraction) (CMS-[name])    ESRD needing dialysis (CMS-[name])  Resolved Problems:    * No resolved hospital problems. *      Jeremy Oliver is a 49 y.o. male with a PMHx of ESRD, CAD??s/p??multiple PCIs,??hx of VT after STEMI in 2018 s/p??PM/ICD,??T2DM, HTN, HLD, HFrEF (EF 20-25%),??TIA, A-fib, OSA on CPAP,??and??bilateral DVTs??who presented with NSTEMI and afib with RVR. Now transferred to CICU for acute dyspnea.     Neurological   #History of TIA  Patient with history of TIA (Jan 2019). Patient reports chronic bilateral lower extremity numbness. Acute SOB and unresponsivness on 11/27 during hemodialysis although patient quickly returned to baseline once in CICU. Given history, possible concern for TIA vs stroke. CTH neg. No further events ON and patient with stable vitals.     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 CBC,CMP, VBG at baseline. Glucose normal. BNP at baseline and CXR without any acute cardiopulmonary process . Upon transfer to CICU, patient no longer SOB and vitals stable with sat 99 on 2L [place]. Patient had an additional three episodes over the course of yesterday, each in which he became slightly altered, SOB, with pain mostly in his leg. Vitals were stable during each event. PVL LLE without any findings. CT Head without findings of hemorraghic stroke. 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 In discussion with patient, he endorsed that he has been under a lot of stress recently and has had these episodes during prior stressful life events. CEEG pending but patient without ON events. Will replete electrolytes PRN this AM. Placed NP consult for device interrogation given patient has ICD. However, given history of pseudoseizure with similar presentation, don't believe there is further workup needed.  --cEEG pending  --NP nurse to interrogate device    Cardiovascular   #NSTEMI  #Multivessel CAD s/p multiple PCI  Patient with history of multiple NSTEMI and stent placements with this admission with chest pain and elevated trops. Recent cath on 01/25/2020 noting severe multivessel disease without intervention given high risk. TTE 11/26 with EF (20-25%).  PCI was not performed during this hospitalization given high risk. Patient on Brilinta, Statin, Zetia. Will continue GDMT. Will discontinue palliative care consult given improvement.   --Palliative care consult discontinue   -- Heparin drip (in place of  apixaban)  --Aspirin 81mg  daily  -- Brilinta   -- Imdur 60 mg daily  --Atorvastatin 80mg    --Zetia 10mg      #History of DVT  #Leg Pain  Patient with LLE leg pain that started three days prior to admission. Acutely worsened on 11/27 during hemodialysis. Unlikely to be DVT, given proper anticoagulation during hospitalization and on Eliquis at home. PVL LLE without clot. Per chart review, pain seems to be chronic with acute worsening due to emotional stress.     #Paroxysmal a-fib  Presented in Afib with RVR, now rate controlled. Continue home meds.  --Metoprolol 100mg  daily  --Amiodaraone 200mg  daily   --Heparin drip    #HFrEF  TTE this admission with EF 20-25%, decreased since his last one in Oct 2021. Will hold Bumetanide today given hemodialysis.     #HTN  History of hypertension. BP in the 150-160s upon admission to CICU. No hypotension during acute event. Since BP stable, can continue medications.       Renal   #ESRD on iHD  Patient with acute event during iHD on 11/27. Patient tolerated iHD on night of 11/27 without events.     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 H/H stable.     Endocrine   #DMII  --Lantus 20 units  --Lispro 12 units with meals  --SSI    Prophylaxis   ? VTE: Heparin gtt  ? GI: Not indicated      Code Status: Full    Dispo: CICU      Objective:      Vitals - past 24 hours  Temp:  [36.4 ??C-36.8 ??C] 36.4 ??C  Heart Rate:  [70-102] 70  SpO2 Pulse:  [67-79] 70  Resp:  [9-26] 17  BP: (125-176)/(42-118) 132/66  SpO2:  [66 %-100 %] 99 % Intake/Output  I/O last 3 completed shifts:  In: 1499.1 [P.O.:480; I.V.:1019.1]  Out: 4259 [DGLOV:5643; Other:2000]       Physical Exam:    General: well-appearing, obese, in no acute distress   HEENT: PERRL, OP clear without lesions  CV: RRR, no m/r/g  Lungs: CTAB, normal wob  Abd: soft, non-tender, non-distended, +bs  Extremities: +1 peripheral edema, 1+ peripheral pulses  Skin: no visible lesions or rashes  Neuro: alert and oriented, no gross focal deficits    @LDAPRESSUREULCER @    Continuous Infusions:       Vent settings for last 24 hours:       Tubes and Drains:  Patient Lines/Drains/Airways Status     Active Active Lines, Drains, & Airways     Name Placement date Placement time Site Days    Peripheral IV 02/23/20 Right Antecubital 02/23/20  1900  Antecubital  2    Peripheral IV 02/25/20 Anterior;Right Forearm 02/25/20  1956  Forearm  less than 1    Hemodialysis Catheter 07/27/19 Right Internal jugular 2.2 mL 2.3 mL 07/27/19  1018  Internal jugular  213                Data Review:   Recent Labs     02/25/20  1102 02/26/20  0429   WBC 8.4 7.1   HGB 10.6* 10.1*   HCT  34.0* 33.0*   PLT 224 195     Recent Labs     02/25/20  1654 02/26/20  0429   NA 137 137   K 4.1 3.9   CL 104 102   CO2 25.0 27.0   BUN 30* 20   CREATININE 5.96* 4.64*   GLU 100 275*   MG 1.7 1.7   PHOS 4.9 4.9      Recent Labs     02/23/20  1637 02/25/20  1102   BILITOT 0.2* 0.3   PROT 6.7 6.6   ALBUMIN 2.8* 2.9*   ALT 9* <7*   AST 12 11   ALKPHOS 102 101      Recent Labs     02/23/20  1637 02/24/20  0553 02/25/20  1259 02/26/20  0429   INR 1.07  --   --   --    APTT  --    < > 54.8* 53.3*    < > = values in this interval not displayed.

## 2020-02-26 NOTE — Unmapped (Signed)
Patient is in normal pacemaker rhythm with blood pressures maintained above goal without intervention.     Patient is on the  room air maintaining saturation above goal.     Patient is LOC: awake and alert  Orientation: person, place and time and Situation.     Patient moves bilateral upper and lower extremities and does  follow commands.     Patient Voiding, no difficulties and is putting out adequate urine.     Patient admitted to TICU today after a code being called down in dialysis. Patient brought to TICU A&O x 4 and vital signs within normal limits with the exception of blood pressure which is hypertensive but normal according to the patient. BP goals were asked about by RN to MD and MD instructed to continue to observe due to starting iHD tonight. Patient remained hypertensive without extreme change. Patient was attempted to be stood and became diaphoretic and weak, patient moved to stretcher via slide board for transport to CT. ICU transport refused to take patient to Xray due to his status    Patient experienced one episode where he felt pain in his left leg and then experienced a stabbing pain in his lungs. Patient appeared to bear down, hold his breath, and appear to lose consciousness. Patient experienced this after being moved to stretcher for transport. Patient then experienced three more episodes with similar symptoms. MD notified of events. RN notified that patient [place] be having a seizure RN and charge RN responded to the bedside to find the patient experiencing similar symptoms as earlier. Patient was griping the bedside tightly and shaking some. Patient then appeared to lose consciousness and became flacid. Patient knocked tele monitor out of holder which discontinued vital monitoring for roughly 30 seconds. Monitor restored and vitals appeared to be within normal limits without deviating from their norm. Patient remained nonverbal, code button was pressed and MD requested to the bedside. MD to the bedside and assessed patient. MD performed ultrasound. MD reported to RN that it was believed to be pseudoseizures and potentially not life threatening. RN instructed to hold patient's hand above patient's face and drop it when the patient is experiencing an event and to report findings.     No events were observed by RN for the remainder of the day. Patient went to CT, PVL performed at bedside, had iHD, video EEG initated at change of shift.     See flowsheets for relevant data    Plan to:    Monitor hemodynamics and intervene as needed  Monitor for and assess pseudoseizure activity             Problem: Adult Inpatient Plan of Care  Goal: Plan of Care Review  02/25/2020 2019 by Manfred Arch, RN  Outcome: Progressing  02/25/2020 2003 by Manfred Arch, RN  Outcome: Progressing  Goal: Patient-Specific Goal (Individualized)  02/25/2020 2019 by Manfred Arch, RN  Outcome: Progressing  02/25/2020 2003 by Manfred Arch, RN  Outcome: Progressing  Goal: Absence of Hospital-Acquired Illness or Injury  02/25/2020 2019 by Manfred Arch, RN  Outcome: Progressing  02/25/2020 2003 by Manfred Arch, RN  Outcome: Progressing  Intervention: Identify and Manage Fall Risk  Recent Flowsheet Documentation  Taken 02/25/2020 1200 by Manfred Arch, RN  Safety Interventions:   aspiration precautions   lighting adjusted for tasks/safety   low bed  Taken 02/25/2020 1130 by Manfred Arch, RN  Safety Interventions:   environmental modification   fall reduction program maintained   lighting adjusted  for tasks/safety  Intervention: Prevent Skin Injury  Recent Flowsheet Documentation  Taken 02/25/2020 1200 by Manfred Arch, RN  Skin Protection:   adhesive use limited   silicone foam dressing in place  Intervention: Prevent and Manage VTE (Venous Thromboembolism) Risk  Recent Flowsheet Documentation  Taken 02/25/2020 1200 by Manfred Arch, RN  Activity Management: activity adjusted per tolerance  Intervention: Prevent Infection  Recent Flowsheet Documentation  Taken 02/25/2020 1200 by Manfred Arch, RN  Infection Prevention:   cohorting utilized   environmental surveillance performed   equipment surfaces disinfected   hand hygiene promoted   personal protective equipment utilized   rest/sleep promoted   single patient room provided   visitors restricted/screened  Taken 02/25/2020 1130 by Manfred Arch, RN  Infection Prevention:   environmental surveillance performed   equipment surfaces disinfected   hand hygiene promoted  Goal: Optimal Comfort and Wellbeing  02/25/2020 2019 by Manfred Arch, RN  Outcome: Progressing  02/25/2020 2003 by Manfred Arch, RN  Outcome: Progressing  Goal: Readiness for Transition of Care  02/25/2020 2019 by Manfred Arch, RN  Outcome: Progressing  02/25/2020 2003 by Manfred Arch, RN  Outcome: Progressing  Goal: Rounds/Family Conference  02/25/2020 2019 by Manfred Arch, RN  Outcome: Progressing  02/25/2020 2003 by Manfred Arch, RN  Outcome: Progressing     Problem: Fall Injury Risk  Goal: Absence of Fall and Fall-Related Injury  02/25/2020 2019 by Manfred Arch, RN  Outcome: Progressing  02/25/2020 2003 by Manfred Arch, RN  Outcome: Progressing  Intervention: Identify and Manage Contributors  Recent Flowsheet Documentation  Taken 02/25/2020 1200 by Manfred Arch, RN  Self-Care Promotion:   independence encouraged   BADL personal objects within reach   BADL personal routines maintained  Intervention: Promote Injury-Free Environment  Recent Flowsheet Documentation  Taken 02/25/2020 1200 by Manfred Arch, RN  Safety Interventions:   aspiration precautions   lighting adjusted for tasks/safety   low bed  Taken 02/25/2020 1130 by Manfred Arch, RN  Safety Interventions:   environmental modification   fall reduction program maintained   lighting adjusted for tasks/safety     Problem: Impaired Wound Healing  Goal: Optimal Wound Healing  02/25/2020 2019 by Manfred Arch, RN  Outcome: Progressing  02/25/2020 2003 by Manfred Arch, RN  Outcome: Progressing  Intervention: Promote Wound Healing  Recent Flowsheet Documentation  Taken 02/25/2020 1200 by Manfred Arch, RN  Activity Management: activity adjusted per tolerance     Problem: Adjustment to Illness (Heart Failure)  Goal: Optimal Coping  02/25/2020 2019 by Manfred Arch, RN  Outcome: Progressing  02/25/2020 2003 by Manfred Arch, RN  Outcome: Progressing     Problem: Cardiac Output Decreased (Heart Failure)  Goal: Optimal Cardiac Output  02/25/2020 2019 by Manfred Arch, RN  Outcome: Progressing  02/25/2020 2003 by Manfred Arch, RN  Outcome: Progressing     Problem: Dysrhythmia (Heart Failure)  Goal: Stable Heart Rate and Rhythm  02/25/2020 2019 by Manfred Arch, RN  Outcome: Progressing  02/25/2020 2003 by Manfred Arch, RN  Outcome: Progressing     Problem: Fluid Imbalance (Heart Failure)  Goal: Fluid Balance  02/25/2020 2019 by Manfred Arch, RN  Outcome: Progressing  02/25/2020 2003 by Manfred Arch, RN  Outcome: Progressing     Problem: Functional Ability Impaired (Heart Failure)  Goal: Optimal Functional Ability  02/25/2020 2019 by Manfred Arch, RN  Outcome: Progressing  02/25/2020 2003 by Manfred Arch, RN  Outcome: Progressing  Intervention: Optimize Functional Ability  Recent Flowsheet Documentation  Taken 02/25/2020 1200  by Manfred Arch, RN  Activity Management: activity adjusted per tolerance  Self-Care Promotion:   independence encouraged   BADL personal objects within reach   BADL personal routines maintained     Problem: Oral Intake Inadequate (Heart Failure)  Goal: Optimal Nutrition Intake  02/25/2020 2019 by Manfred Arch, RN  Outcome: Progressing  02/25/2020 2003 by Manfred Arch, RN  Outcome: Progressing     Problem: Respiratory Compromise (Heart Failure)  Goal: Effective Oxygenation and Ventilation  02/25/2020 2019 by Manfred Arch, RN  Outcome: Progressing  02/25/2020 2003 by Manfred Arch, RN  Outcome: Progressing  Intervention: Promote Airway Secretion Clearance  Recent Flowsheet Documentation  Taken 02/25/2020 1200 by Manfred Arch, RN  Cough And Deep Breathing: done independently per patient     Problem: Sleep Disordered Breathing (Heart Failure)  Goal: Effective Breathing Pattern During Sleep  02/25/2020 2019 by Manfred Arch, RN  Outcome: Progressing  02/25/2020 2003 by Manfred Arch, RN  Outcome: Progressing     Problem: Adjustment to Illness (Acute Coronary Syndrome)  Goal: Optimal Adaptation to Illness  02/25/2020 2019 by Manfred Arch, RN  Outcome: Progressing  02/25/2020 2003 by Manfred Arch, RN  Outcome: Progressing     Problem: Dysrhythmia (Acute Coronary Syndrome)  Goal: Normalized Cardiac Rhythm  02/25/2020 2019 by Manfred Arch, RN  Outcome: Progressing  02/25/2020 2003 by Manfred Arch, RN  Outcome: Progressing     Problem: Cardiac-Related Pain (Acute Coronary Syndrome)  Goal: Absence of Cardiac-Related Pain  02/25/2020 2019 by Manfred Arch, RN  Outcome: Progressing  02/25/2020 2003 by Manfred Arch, RN  Outcome: Progressing     Problem: Hemodynamic Instability (Acute Coronary Syndrome)  Goal: Effective Cardiac Pump Function  02/25/2020 2019 by Manfred Arch, RN  Outcome: Progressing  02/25/2020 2003 by Manfred Arch, RN  Outcome: Progressing     Problem: Tissue Perfusion (Acute Coronary Syndrome)  Goal: Adequate Tissue Perfusion  02/25/2020 2019 by Manfred Arch, RN  Outcome: Progressing  02/25/2020 2003 by Manfred Arch, RN  Outcome: Progressing  Intervention: Optimize Cardiac Tissue Perfusion  Recent Flowsheet Documentation  Taken 02/25/2020 1200 by Manfred Arch, RN  Activity Management: activity adjusted per tolerance     Problem: Hemodynamic Instability (Hemodialysis)  Goal: Effective Tissue Perfusion  02/25/2020 2019 by Manfred Arch, RN  Outcome: Progressing  02/25/2020 2003 by Manfred Arch, RN  Outcome: Progressing

## 2020-02-26 NOTE — Unmapped (Signed)
Pt is getting PVL done in the room,Spoke to care RN will place another order when Pt is ready for PIV.    Workup / Procedure Time:  15 minutes      RN was notified.       Thank you,     Lorelle Formosa RN Venous Access Team

## 2020-02-26 NOTE — Unmapped (Signed)
VS per policies. Pt w/ hx this admit of becoming unresponsive during treatments. Monitor carefully. No acute events thus far.

## 2020-02-26 NOTE — Unmapped (Signed)
Problem: Adult Inpatient Plan of Care  Goal: Plan of Care Review  Outcome: Progressing  Goal: Patient-Specific Goal (Individualized)  Outcome: Progressing  Goal: Absence of Hospital-Acquired Illness or Injury  Outcome: Progressing  Intervention: Identify and Manage Fall Risk  Recent Flowsheet Documentation  Taken 02/26/2020 0400 by Mariann Laster, RN  Safety Interventions:   aspiration precautions   bed alarm   bleeding precautions   commode/urinal/bedpan at bedside   environmental modification   fall reduction program maintained   infection management   lighting adjusted for tasks/safety   low bed  Taken 02/26/2020 0000 by Mariann Laster, RN  Safety Interventions:   aspiration precautions   bed alarm   bleeding precautions   commode/urinal/bedpan at bedside   environmental modification   fall reduction program maintained   infection management   lighting adjusted for tasks/safety   low bed  Taken 02/25/2020 2000 by Mariann Laster, RN  Safety Interventions:   aspiration precautions   bed alarm   bleeding precautions   commode/urinal/bedpan at bedside   environmental modification   fall reduction program maintained   infection management   lighting adjusted for tasks/safety   low bed  Intervention: Prevent Infection  Recent Flowsheet Documentation  Taken 02/26/2020 0400 by Mariann Laster, RN  Infection Prevention: visitors restricted/screened  Taken 02/26/2020 0000 by Mariann Laster, RN  Infection Prevention:   visitors restricted/screened   single patient room provided  Taken 02/25/2020 2000 by Mariann Laster, RN  Infection Prevention:   single patient room provided   visitors restricted/screened  Goal: Optimal Comfort and Wellbeing  Outcome: Progressing  Goal: Readiness for Transition of Care  Outcome: Progressing  Goal: Rounds/Family Conference  Outcome: Progressing     Problem: Fall Injury Risk  Goal: Absence of Fall and Fall-Related Injury  Outcome: Progressing  Intervention: Promote Injury-Free Environment  Recent Flowsheet Documentation  Taken 02/26/2020 0400 by Mariann Laster, RN  Safety Interventions:   aspiration precautions   bed alarm   bleeding precautions   commode/urinal/bedpan at bedside   environmental modification   fall reduction program maintained   infection management   lighting adjusted for tasks/safety   low bed  Taken 02/26/2020 0000 by Mariann Laster, RN  Safety Interventions:   aspiration precautions   bed alarm   bleeding precautions   commode/urinal/bedpan at bedside   environmental modification   fall reduction program maintained   infection management   lighting adjusted for tasks/safety   low bed  Taken 02/25/2020 2000 by Mariann Laster, RN  Safety Interventions:   aspiration precautions   bed alarm   bleeding precautions   commode/urinal/bedpan at bedside   environmental modification   fall reduction program maintained   infection management   lighting adjusted for tasks/safety   low bed     Problem: Impaired Wound Healing  Goal: Optimal Wound Healing  Outcome: Progressing     Problem: Adjustment to Illness (Heart Failure)  Goal: Optimal Coping  Outcome: Progressing     Problem: Cardiac Output Decreased (Heart Failure)  Goal: Optimal Cardiac Output  Outcome: Progressing     Problem: Dysrhythmia (Heart Failure)  Goal: Stable Heart Rate and Rhythm  Outcome: Progressing     Problem: Fluid Imbalance (Heart Failure)  Goal: Fluid Balance  Outcome: Progressing     Problem: Functional Ability Impaired (Heart Failure)  Goal: Optimal Functional Ability  Outcome: Progressing     Problem: Oral Intake Inadequate (Heart Failure)  Goal: Optimal Nutrition  Intake  Outcome: Progressing     Problem: Respiratory Compromise (Heart Failure)  Goal: Effective Oxygenation and Ventilation  Outcome: Progressing     Problem: Sleep Disordered Breathing (Heart Failure)  Goal: Effective Breathing Pattern During Sleep  Outcome: Progressing     Problem: Adjustment to Illness (Acute Coronary Syndrome)  Goal: Optimal Adaptation to Illness  Outcome: Progressing     Problem: Dysrhythmia (Acute Coronary Syndrome)  Goal: Normalized Cardiac Rhythm  Outcome: Progressing     Problem: Cardiac-Related Pain (Acute Coronary Syndrome)  Goal: Absence of Cardiac-Related Pain  Outcome: Progressing     Problem: Hemodynamic Instability (Acute Coronary Syndrome)  Goal: Effective Cardiac Pump Function  Outcome: Progressing     Problem: Tissue Perfusion (Acute Coronary Syndrome)  Goal: Adequate Tissue Perfusion  Outcome: Progressing     Problem: Hemodynamic Instability (Hemodialysis)  Goal: Effective Tissue Perfusion  Outcome: Progressing     Problem: Self-Care Deficit  Goal: Improved Ability to Complete Activities of Daily Living  Outcome: Progressing   AAOX4, pain management in progress with tylenol, Afebrile, MAP 70's-low 100, pulses palpable, A-Paced, HR 70's, appetite good for dinner, voiding with urinal, EEG monitoring in progress, no seizure activity noted, call button placed within reach, encouraged to use call button to call for help, kept on close observation.

## 2020-02-27 DIAGNOSIS — Z09 Encounter for follow-up examination after completed treatment for conditions other than malignant neoplasm: Principal | ICD-10-CM

## 2020-02-27 LAB — HEPATITIS B SURFACE ANTIGEN: HEPATITIS B SURFACE ANTIGEN: NONREACTIVE

## 2020-02-27 NOTE — Unmapped (Signed)
Jeremy Oliver, in speaking with this patient to schedule his hospital follow up he stated he needs to speak with you. I asked if he was a current patient and he stated no but he has seen you before. He would like for you to call him in reference to something the doctor in the hospital wants him to do.      Call back no# [phone]    Thank you  Vickie

## 2020-02-28 ENCOUNTER — Ambulatory Visit: Admit: 2020-02-28 | Discharge: 2020-02-29 | Payer: MEDICARE

## 2020-03-02 MED FILL — ISOSORBIDE MONONITRATE ER 60 MG TABLET,EXTENDED RELEASE 24 HR: 54 days supply | Qty: 54 | Fill #1 | Status: AC

## 2020-03-02 MED FILL — OMEPRAZOLE 40 MG CAPSULE,DELAYED RELEASE: 90 days supply | Qty: 180 | Fill #1 | Status: AC

## 2020-03-02 MED FILL — AMIODARONE 200 MG TABLET: ORAL | 90 days supply | Qty: 90 | Fill #2

## 2020-03-02 MED FILL — VENTOLIN HFA 90 MCG/ACTUATION AEROSOL INHALER: 25 days supply | Qty: 18 | Fill #2 | Status: AC

## 2020-03-02 MED FILL — ELIQUIS 5 MG TABLET: 30 days supply | Qty: 60 | Fill #6 | Status: AC

## 2020-03-02 MED FILL — CHOLECALCIFEROL (VITAMIN D3) 25 MCG (1,000 UNIT) TABLET: 100 days supply | Qty: 100 | Fill #1 | Status: AC

## 2020-03-02 MED FILL — ISOSORBIDE MONONITRATE ER 60 MG TABLET,EXTENDED RELEASE 24 HR: ORAL | 54 days supply | Qty: 54 | Fill #1

## 2020-03-02 MED FILL — RENVELA 800 MG TABLET: 90 days supply | Qty: 540 | Fill #1 | Status: AC

## 2020-03-02 MED FILL — FLUOXETINE 40 MG CAPSULE: 90 days supply | Qty: 180 | Fill #0 | Status: AC

## 2020-03-02 MED FILL — NITROGLYCERIN 400 MCG/SPRAY TRANSLINGUAL: 10 days supply | Qty: 12 | Fill #1 | Status: AC

## 2020-03-02 MED FILL — DICLOFENAC 1 % TOPICAL GEL: 34 days supply | Qty: 200 | Fill #1 | Status: AC

## 2020-03-02 MED FILL — NOVOLOG FLEXPEN U-100 INSULIN ASPART 100 UNIT/ML (3 ML) SUBCUTANEOUS: 90 days supply | Qty: 45 | Fill #2 | Status: AC

## 2020-03-02 MED FILL — NOVOLOG FLEXPEN U-100 INSULIN ASPART 100 UNIT/ML (3 ML) SUBCUTANEOUS: SUBCUTANEOUS | 90 days supply | Qty: 45 | Fill #2

## 2020-03-02 MED FILL — ATORVASTATIN 80 MG TABLET: 90 days supply | Qty: 90 | Fill #0 | Status: AC

## 2020-03-02 MED FILL — UNIFINE PENTIPS 31 GAUGE X 5/16" NEEDLE (8 MM): 90 days supply | Qty: 400 | Fill #0 | Status: AC

## 2020-03-02 MED FILL — EZETIMIBE 10 MG TABLET: ORAL | 30 days supply | Qty: 30 | Fill #4

## 2020-03-02 MED FILL — CHOLECALCIFEROL (VITAMIN D3) 25 MCG (1,000 UNIT) TABLET: ORAL | 100 days supply | Qty: 100 | Fill #1

## 2020-03-02 MED FILL — ELIQUIS 5 MG TABLET: ORAL | 30 days supply | Qty: 60 | Fill #6

## 2020-03-02 MED FILL — VENTOLIN HFA 90 MCG/ACTUATION AEROSOL INHALER: RESPIRATORY_TRACT | 25 days supply | Qty: 18 | Fill #2

## 2020-03-02 MED FILL — DICLOFENAC 1 % TOPICAL GEL: TOPICAL | 34 days supply | Qty: 200 | Fill #1

## 2020-03-02 MED FILL — NITROGLYCERIN 400 MCG/SPRAY TRANSLINGUAL: SUBLINGUAL | 10 days supply | Qty: 12 | Fill #1

## 2020-03-02 MED FILL — OMEPRAZOLE 40 MG CAPSULE,DELAYED RELEASE: ORAL | 90 days supply | Qty: 180 | Fill #1

## 2020-03-02 MED FILL — EZETIMIBE 10 MG TABLET: 30 days supply | Qty: 30 | Fill #4 | Status: AC

## 2020-03-02 MED FILL — LEVEMIR FLEXTOUCH U-100 INSULIN 100 UNIT/ML (3 ML) SUBCUTANEOUS PEN: 50 days supply | Qty: 15 | Fill #0 | Status: AC

## 2020-03-02 MED FILL — RENVELA 800 MG TABLET: 90 days supply | Qty: 540 | Fill #1

## 2020-03-02 MED FILL — BRILINTA 90 MG TABLET: 90 days supply | Qty: 180 | Fill #0 | Status: AC

## 2020-03-02 MED FILL — MIRTAZAPINE 7.5 MG TABLET: 30 days supply | Qty: 30 | Fill #0 | Status: AC

## 2020-03-02 MED FILL — JARDIANCE 10 MG TABLET: 30 days supply | Qty: 30 | Fill #0 | Status: AC

## 2020-03-02 MED FILL — AMIODARONE 200 MG TABLET: 90 days supply | Qty: 90 | Fill #2 | Status: AC

## 2020-03-03 ENCOUNTER — Emergency Department
Admit: 2020-03-03 | Discharge: 2020-03-04 | Discharge status: Against Medical Advice | Payer: MEDICARE | Attending: Emergency Medicine

## 2020-03-03 ENCOUNTER — Ambulatory Visit
Admit: 2020-03-03 | Discharge: 2020-03-04 | Discharge status: Against Medical Advice | Payer: MEDICARE | Attending: Emergency Medicine

## 2020-03-03 DIAGNOSIS — F17290 Nicotine dependence, other tobacco product, uncomplicated: Principal | ICD-10-CM

## 2020-03-03 DIAGNOSIS — I5022 Chronic systolic (congestive) heart failure: Principal | ICD-10-CM

## 2020-03-03 DIAGNOSIS — G4733 Obstructive sleep apnea (adult) (pediatric): Principal | ICD-10-CM

## 2020-03-03 DIAGNOSIS — I480 Paroxysmal atrial fibrillation: Principal | ICD-10-CM

## 2020-03-03 DIAGNOSIS — Z955 Presence of coronary angioplasty implant and graft: Principal | ICD-10-CM

## 2020-03-03 DIAGNOSIS — K219 Gastro-esophageal reflux disease without esophagitis: Principal | ICD-10-CM

## 2020-03-03 DIAGNOSIS — Z8673 Personal history of transient ischemic attack (TIA), and cerebral infarction without residual deficits: Principal | ICD-10-CM

## 2020-03-03 DIAGNOSIS — Z20822 Contact with and (suspected) exposure to covid-19: Principal | ICD-10-CM

## 2020-03-03 DIAGNOSIS — N186 End stage renal disease: Principal | ICD-10-CM

## 2020-03-03 DIAGNOSIS — E1121 Type 2 diabetes mellitus with diabetic nephropathy: Principal | ICD-10-CM

## 2020-03-03 DIAGNOSIS — E1122 Type 2 diabetes mellitus with diabetic chronic kidney disease: Principal | ICD-10-CM

## 2020-03-03 DIAGNOSIS — E785 Hyperlipidemia, unspecified: Principal | ICD-10-CM

## 2020-03-03 DIAGNOSIS — I132 Hypertensive heart and chronic kidney disease with heart failure and with stage 5 chronic kidney disease, or end stage renal disease: Principal | ICD-10-CM

## 2020-03-03 DIAGNOSIS — Z992 Dependence on renal dialysis: Principal | ICD-10-CM

## 2020-03-03 DIAGNOSIS — I4892 Unspecified atrial flutter: Principal | ICD-10-CM

## 2020-03-03 DIAGNOSIS — I200 Unstable angina: Principal | ICD-10-CM

## 2020-03-03 DIAGNOSIS — I252 Old myocardial infarction: Principal | ICD-10-CM

## 2020-03-03 DIAGNOSIS — Z794 Long term (current) use of insulin: Principal | ICD-10-CM

## 2020-03-03 DIAGNOSIS — J45909 Unspecified asthma, uncomplicated: Principal | ICD-10-CM

## 2020-03-03 LAB — CBC W/ AUTO DIFF
BASOPHILS ABSOLUTE COUNT: 0.1 10*9/L (ref 0.0–0.1)
BASOPHILS RELATIVE PERCENT: 0.9 %
EOSINOPHILS ABSOLUTE COUNT: 0.1 10*9/L (ref 0.0–0.7)
EOSINOPHILS RELATIVE PERCENT: 1.5 %
HEMATOCRIT: 34.5 % — ABNORMAL LOW (ref 38.0–50.0)
HEMOGLOBIN: 11.2 g/dL — ABNORMAL LOW (ref 13.5–17.5)
LYMPHOCYTES ABSOLUTE COUNT: 1.1 10*9/L (ref 0.7–4.0)
LYMPHOCYTES RELATIVE PERCENT: 11.9 %
MEAN CORPUSCULAR HEMOGLOBIN CONC: 32.6 g/dL (ref 30.0–36.0)
MEAN CORPUSCULAR HEMOGLOBIN: 28.2 pg (ref 26.0–34.0)
MEAN CORPUSCULAR VOLUME: 86.6 fL (ref 81.0–95.0)
MEAN PLATELET VOLUME: 8.6 fL (ref 7.0–10.0)
MONOCYTES ABSOLUTE COUNT: 0.4 10*9/L (ref 0.1–1.0)
MONOCYTES RELATIVE PERCENT: 4.8 %
NEUTROPHILS ABSOLUTE COUNT: 7.6 10*9/L (ref 1.7–7.7)
NEUTROPHILS RELATIVE PERCENT: 80.9 %
NUCLEATED RED BLOOD CELLS: 0 /100{WBCs} (ref <=4)
PLATELET COUNT: 202 10*9/L (ref 150–450)
RED BLOOD CELL COUNT: 3.98 10*12/L — ABNORMAL LOW (ref 4.32–5.72)
RED CELL DISTRIBUTION WIDTH: 15.9 % — ABNORMAL HIGH (ref 12.0–15.0)
WBC ADJUSTED: 9.4 10*9/L (ref 3.5–10.5)

## 2020-03-03 LAB — BASIC METABOLIC PANEL
ANION GAP: 7 mmol/L (ref 5–14)
BLOOD UREA NITROGEN: 31 mg/dL — ABNORMAL HIGH (ref 9–23)
BUN / CREAT RATIO: 6
CALCIUM: 8.8 mg/dL (ref 8.7–10.4)
CHLORIDE: 102 mmol/L (ref 98–107)
CO2: 26.9 mmol/L (ref 20.0–31.0)
CREATININE: 5.13 mg/dL — ABNORMAL HIGH
EGFR CKD-EPI AA MALE: 14 mL/min/{1.73_m2} — ABNORMAL LOW (ref >=60)
EGFR CKD-EPI NON-AA MALE: 12 mL/min/{1.73_m2} — ABNORMAL LOW (ref >=60)
GLUCOSE RANDOM: 257 mg/dL — ABNORMAL HIGH (ref 70–179)
POTASSIUM: 4.6 mmol/L — ABNORMAL HIGH (ref 3.4–4.5)
SODIUM: 136 mmol/L (ref 135–145)

## 2020-03-03 LAB — PROTIME-INR
INR: 1.21
PROTIME: 14.1 s — ABNORMAL HIGH (ref 10.3–13.4)

## 2020-03-03 LAB — HIGH SENSITIVITY TROPONIN I - 2 HOUR SERIAL
HIGH SENSITIVITY TROPONIN - DELTA (0-2H): 11 ng/L — ABNORMAL HIGH (ref <=7)
HIGH-SENSITIVITY TROPONIN I - 2 HOUR: 88 ng/L (ref <=53)

## 2020-03-03 LAB — HIGH SENSITIVITY TROPONIN I - SERIAL: HIGH SENSITIVITY TROPONIN I: 77 ng/L (ref <=53)

## 2020-03-03 LAB — MAGNESIUM: MAGNESIUM: 1.6 mg/dL (ref 1.6–2.6)

## 2020-03-04 MED ADMIN — aspirin tablet 325 mg: 325 mg | ORAL | Stop: 2020-03-03

## 2020-03-04 NOTE — Unmapped (Signed)
Bed: 09  Expected date:   Expected time:   Means of arrival:   Comments:

## 2020-03-04 NOTE — Unmapped (Addendum)
Care Management  Initial Transition Planning Assessment     CM initial assessment completed telephonically as a precautionary measure in accordance with [facility] COVID-19 Pandemic emergency response plan. Patient representative Wife [name]  verbalized understanding and agreement with completing this assessment by telephone.     Patient has been identified as a Runner, broadcasting/film/video.  Requested that a  timely follow-up appointment be scheduled and documented prior to discharge.  Longitudinal Plan of Care reviewed, the following information is noted for continuity of care:  Per EMR, pt is a 49 yo M with PMH significant for HLD, DM, obesity, GERD, paroxysmal atrial fibrillation, OSA, HFrEF, ESRD on dialysis, STEMI with LAD stent placement in 2018, and recent hospitalization 11/25-28 for NSTEMI, Afib, and PE during hospitalization presenting today with acute onset chest pain.    Pt was just dc 02/26/20. Pt back for chest pain. No changes on his information he still goes and drive himself  to [facility] [place] [facility] [name] per wife. Pt still independent w/ his ADL's and use walker or W/C PRN. Wife is his primary support and ride home.      Medical Provider(s): Kurtis Bushman, MD  Primary Insurance- Payor: MEDICARE / Plan: MEDICARE PART A AND PART B / Product Type: *No Product type* /   Secondary Insurance ??? Secondary Insurance  MEDICAID [place]  Prescription Coverage ???   Preferred Pharmacy - [facility] - [place], [place] - [address]  [facility] [place] [facility]  [facility]  [facility]  [facility] - [place] ([place]), [place] - [address]  At this time the patient is able to make  his own decisions.  Transportation home: Private vehicle  Level of function prior to admission: Independent    Reason for Admission: Admitting Diagnosis:  No admission diagnoses are documented for this encounter.    Previous admit date: 02/24/2020                   General  Care Manager assessed the patient by : Telephone conversation with family,Medical record review (R/o coivid Pt currently doing x ray)  Orientation Level: Oriented X4  Functional level prior to admission: Independent  Reason for referral: Discharge Planning    Contact/Decision Maker  Extended Emergency Contact Information  Primary Emergency Contact: Atkin,[name]  Address: [address]           [place], [place] [address] [place]  Physical Address : [address] [place] [address]  Mobile Phone: [phone]  Relation: Spouse  Secondary Emergency Contact: Crumpler,Bridgette   [place]  Home Phone: [phone]  Mobile Phone: [phone]  Relation: Daughter    Legal Next of Kin / Guardian / POA / Advance Directives     [name] (HCPOA): Wain,[name] - [phone]    [name], First Alternate: Brugh,Bridgette - Daughter - [phone]    Advance Directive (Medical Treatment)  Does patient have an advance directive covering medical treatment?: Patient has advance directive covering medical treatment, copy in chart.    Health Care Decision Maker [[name]] (Medical & Mental Health Treatment)  Healthcare Decision Maker: [name] documented in the [name]/Contact Info section.    Advance Directive (Mental Health Treatment)  Does patient have an advance directive covering mental health treatment?: Patient has advance directive covering mental health treatment, copy in chart.    Patient Information  Lives with: Spouse/significant other  Pt phone#: [phone] (home)   Type of Residence: Private residence   Location/Detail: Patient lives with wife [name] at [address], [place], [place], [address]. 2 steps to enter.  Support Systems/Concerns: Spouse  Responsibilities/Dependents at home?: No  Home Care services in place prior to admission?: No   Equipment Currently Used at Home: cane, straight,wheelchair, manual,walker, rolling,shower chair,commode chair,respiratory supplies (BIPAP)  Current HME Agency (Name/Phone #): [facility]    Currently receiving outpatient dialysis?: Yes  Facility providing dialysis (Name/Contact Info): [facility] [place] [address] [place] [phone] Goes M,W,F 0630    Financial Information       Need for financial assistance?: No       Social Determinants of Health  Social Determinants of Health     Tobacco Use: High Risk   ??? Smoking Tobacco Use: Never Smoker   ??? Smokeless Tobacco Use: Current User   Alcohol Use: Not At Risk   ??? How often do you have a drink containing alcohol?: Never   ??? How many drinks containing alcohol do you have on a typical day when you are drinking?: Not on file   ??? How often do you have 5 or more drinks on one occasion?: Never   Financial Resource Strain: Low Risk    ??? Difficulty of Paying Living Expenses: Not very hard   Food Insecurity: No Food Insecurity   ??? Worried About Running Out of Food in the Last Year: Never true   ??? Ran Out of Food in the Last Year: Never true   Transportation Needs: No Transportation Needs   ??? Lack of Transportation (Medical): No   ??? Lack of Transportation (Non-Medical): No   Physical Activity: Not on file   Stress: Stress Concern Present   ??? Feeling of Stress : Rather much   Social Connections: Moderately Isolated   ??? Frequency of Communication with Friends and Family: More than three times a week   ??? Frequency of Social Gatherings with Friends and Family: Twice a week   ??? Attends Religious Services: Never   ??? Active Member of Clubs or Organizations: No   ??? Attends Banker Meetings: Never   ??? Marital Status: Married   Catering manager Violence: Not At Risk   ??? Fear of Current or Ex-Partner: No   ??? Emotionally Abused: No   ??? Physically Abused: No   ??? Sexually Abused: No   Depression: Not at risk   ??? PHQ-2 Score: 2   Housing/Utilities: Low Risk    ??? Within the past 12 months, have you ever stayed: outside, in a car, in a tent, in an overnight shelter, or temporarily in someone else's home (i.e. couch-surfing)?: No   ??? Are you worried about losing your housing?: No   ??? Within the past 12 months, have you been unable to get utilities (heat, electricity) when it was really needed?: No   Substance Use: Not on file   Health Literacy: Low Risk    ??? : Never       Discharge Needs Assessment  Concerns to be Addressed: denies needs/concerns at this time,adjustment to diagnosis/illness    Clinical Risk Factors: Readmission < 30 Days,> 65,Multiple Diagnoses (Chronic),Principal Diagnosis: Cancer, Stroke, COPD, Heart Failure, AMI, Pneumonia, Joint Replacment  Barriers to taking medications: No  Prior overnight hospital stay or ED visit in last 90 days: Yes  Readmission Within the Last 30 Days: previous discharge plan unsuccessful  Patient's Choice of Community Agency(s): Dr.  Talbert Forest PCP  Anticipated Changes Related to Illness: none  Equipment Needed After Discharge: none    Discharge Facility/Level of Care Needs: other (see comments) (Home)    Readmission  Risk of Unplanned Readmission Score:  %  Predictive Model Details   No score data available for [facility] Risk of Unplanned Readmission     Readmitted Within the Last 30 Days? (No if blank)   Patient at risk for readmission?: Yes    Discharge Plan  Screen findings are: Care Manager reviewed the plan of the patient's care with the Multidisciplinary Team. No discharge planning needs identified at this time. Care Manager will continue to manage plan and monitor patient's progress with the team.    Expected Discharge Date: TBD    Expected Transfer from Critical Care:  NA    Quality data for continuing care services shared with patient and/or representative?: Yes  Patient and/or family were provided with choice of facilities / services that are available and appropriate to meet post hospital care needs?: Yes   List choices in order highest to lowest preferred, if applicable. : [facility]    Initial Assessment complete?: Yes     Personal Health Advocate Pool contacted by In Basket message regarding post-acute services and collaboration around plan of care.  When patient/family asked, Were the services I provided/discussed with you today of value?, patient/family responded Y/N? Yes - Thanked CM

## 2020-03-04 NOTE — Unmapped (Addendum)
ED Attending Note  [facility] Emergency Department   I saw the patient on 03/04/2020.      Patient Name: Jeremy Oliver  Chief Complaint: Chest Pressure       ED Clinical Impression     Final diagnoses:   Unstable angina (CMS-[name]) (Primary)      Impression, ED Course, Assessment and Plan     Medical Decision Making  Impression: Jeremy Route Gossen is a 49 y.o. male with a history of recent hospitalization for A-fib with RVR with NSTEMI 9 days ago, hx of VT after STEMI in 2018 s/p??PM/ICD,??T2DM, HTN, HLD, HFrEF (EF 35-40%),??TIA, A-fib??(on apixaban), ESRD on dialysis ([name]), CAD??s/p??multiple PCIs??(last 12/2019??on Brillinta/Eliquis),??OSA on CPAP,??and??bilateral DVTs presenting with acute onset of L-sided stabbing chest pain 45 min PTA in the setting of laying in bed after having eaten at a buffet today.     On initial exam, patient is ill-appearing and in NAD. Triage VS are notable for HTN to 184/93 and patient is afebrile. Exam is notable for pain was 3/10, with an acute episode of increased pain and increased BP to the 180s/80s. No motor deficits or CN deficits. Prolonged QTc on EKG, elevated troponin at 77 and uptrending to 88.     Presentation is most concerning for unstable angina/NSTEMI but differential also includes volume overload or CHF, PE, pneumonia.  Given that pain is not pleuritic and he is therapeutically anticoagulated, low suspicion for PE at this time.    ED Course:      9:45 PM   Will page MAO for admission for unstable angina.     10:25 PM   Patient wants to leave AMA. We have discussed extensively the risk/benefits with leaving AMA in this condition. We have also discussed that given patient's clinical presentation consistent with unstable angina and NSTEMI, we recommend admission for cardiology management.  Unfortunately, patient is adamant that he would prefer not to stay in the hospital.  Had a long discussion with the patient and his wife at bedside about risks  of leaving in the setting of ongoing cardiac ischemia including but not limited to death or disability.  He and his wife expressed understanding and agreement with this.  He states that he is feeling better but does report that if he worsens in any way, he will return to the ED plan.  Plans to call his cardiologist on Monday morning.      Patient would still like to leave AMA and will follow-up with cardiologist. Strict return precautions given. The patient expresses understanding and is comfortable with the discharge plan.            History   History obtained from: Patient     Jeremy Route Hornsby is a 49 y.o. male with a past medical history notable for A-fib with RVR with NSTEMI 9 days ago, hx of VT after STEMI in 2018 s/p??PM/ICD,??T2DM, HTN, HLD, HFrEF (EF 35-40%),??TIA, A-fib??(on apixaban), ESRD on dialysis ([name]), CAD??s/p??multiple PCIs??(last 12/2019??on Brillinta/Eliquis), T2DM,??OSA on CPAP,??and??bilateral DVTs presenting with chest pain. Patient reports acute onset of L-sided throbbing, pressure-like chest pain 45 min PTA in the setting of laying in bed after having eaten at a buffet today. He has taken 2 doses of nitroglycerin at home and 1 dose of nitroglycerin in the parking lot here with mild improvement of pain.  As well as dyspnea since onset of the pain.  Of note, he endorses an episode of NBNB emesis while in the parking lot here  Presently, the  patient states that chest pain has transitioned from stabbing to pressure-like and radiates to jaw with LUE tingling and lightheadedness. Denies shortness of breath.  Worse with exertion.  No fever, cough, infectious symptoms.  No new lower extremity edema, orthopnea or paroxysmal nocturnal dyspnea.  Has not missed recent dialysis sessions.  No erythema or purulent drainage around his right sided dialysis catheter.      Past Medical/Past Surgical History:   Reviewed in EHR including nursing documentation as outlined. Pertinent PMH/PSH also noted above in HPI.   Past Medical History:   Diagnosis Date   ??? Angina at rest (CMS-[name])    ??? Arthritis    ??? Asthma    ??? Atrial fibrillation and flutter (CMS-[name]) 06/08/14   ??? BMI 40.0-44.9, adult (CMS-[name]) 10/29/2015   ??? Chronic anticoagulation 02/17/2018   ??? Chronic pain syndrome 04/11/2014    ~Use daily lyrica as recommended ~Tramadol as needed for pain ~Fluoxetine daily as recommended ~Daily exercise ~Eat a healthy diet ~Good control of your blood sugars can help    ??? Coronary artery disease    ??? Depression    ??? Diabetes mellitus (CMS-[name])    ??? ESRD (end stage renal disease) on dialysis (CMS-[name])    ??? Eye trauma 1998    glass in both eyes and removed   ??? GERD (gastroesophageal reflux disease)    ??? Gout    ??? Homeless    ??? Hyperlipidemia    ??? Hypertension    ??? Illiteracy and low-level literacy    ??? Microscopic hematuria    ??? Migraine    ??? Nephrolithiasis    ??? 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 Nonalcoholic fatty liver disease    ??? NSTEMI (non-ST elevated myocardial infarction) (CMS-[name]) 01/13/2017   ??? Obesity    ??? Obstructive sleep apnea 2009   ??? Panic attacks    ??? 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 Seizure-like activity (CMS-[name])     ~Monitor for symptoms and report them to your primary care provider if they occur    ??? Systolic CHF, chronic (CMS-[name])     EF 40-45% 2017   ??? TIA (transient ischemic attack)      Patient Active Problem List   Diagnosis   ??? 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 Idiopathic chronic gout of left knee   ??? Hypercholesteremia   ??? Diabetes mellitus with coincident hypertension (CMS-[name])   ??? 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 Osteoarthritis   ??? Chronic nonalcoholic liver disease   ??? GERD (gastroesophageal reflux disease)   ??? Mild intermittent asthma without complication   ??? Chronic pain syndrome   ??? Paroxysmal atrial fibrillation (CMS-[name])   ??? OSA (obstructive sleep apnea)   ??? Tobacco use disorder   ??? Status post cardiac pacemaker procedure   ??? Diabetes mellitus with gastroparesis (CMS-[name])   ??? Cardiac pacemaker in situ   ??? Recurrent major depressive disorder, in full  remission (CMS-[name])   ??? NSTEMI (non-ST elevation myocardial infarction) (CMS-[name])   ??? Adjustment disorder with mixed anxiety and depressed mood   ??? HFrEF (heart failure with reduced ejection fraction) (CMS-[name])   ??? Hyperkalemia   ??? Acquired left foot drop   ??? Decreased sensation of lower extremity, left   ??? Chronic anticoagulation   ??? Type 2 diabetes mellitus with stage 4 chronic kidney disease, with long-term current use of insulin (CMS-[name])   ??? Situational stress   ??? Cough   ??? QT prolongation   ??? ESRD needing dialysis (CMS-[name])   ??? Palliative care by specialist   ??? Angina pectoris (CMS-[name])   ??? Psychophysiological insomnia     Past Surgical History:   Procedure Laterality Date   ??? CARDIAC CATHETERIZATION  03/08/2007   ??? CORONARY STENT PLACEMENT     ??? CYSTOURETHROSCOPY  12/31/2009     Microscopic hematuria   ??? KNEE SURGERY Right 09/23/2006    arthroscopic knee surgery medial and lateral meniscus tears as well as crystal disease   ??? PR CATH PLACE/CORON ANGIO, IMG SUPER/INTERP,R&L HRT CATH, L HRT VENTRIC N/A 02/13/2017    Procedure: Left/Right Heart Catheterization;  Surgeon: Marlaine Hind, MD;  Location: [facility] CATH;  Service: Cardiology   ??? PR CATH PLACE/CORON ANGIO, IMG SUPER/INTERP,W LEFT HEART VENTRICULOGRAPHY N/A 03/17/2017    Procedure: Left Heart Catheterization W Intervention;  Surgeon: Marlaine Hind, MD;  Location: [facility] CATH;  Service: Cardiology   ??? PR CATH PLACE/CORON ANGIO, IMG SUPER/INTERP,W LEFT HEART VENTRICULOGRAPHY N/A 05/12/2018    Procedure: CATH LEFT HEART CATHETERIZATION W INTERVENTION;  Surgeon: Dorathy Kinsman, MD;  Location: [facility] CATH;  Service: Cardiology   ??? PR CATH PLACE/CORON ANGIO, IMG SUPER/INTERP,W LEFT HEART VENTRICULOGRAPHY N/A 10/22/2018    Procedure: CATH LEFT HEART CATHETERIZATION W INTERVENTION;  Surgeon: Marlaine Hind, MD;  Location: [facility] CATH;  Service: Cardiology   ??? PR CATH PLACE/CORON ANGIO, IMG SUPER/INTERP,W LEFT HEART VENTRICULOGRAPHY N/A 08/19/2019    Procedure: Left Heart Catheterization W Intervention;  Surgeon: Marlaine Hind, MD;  Location: [facility] CATH;  Service: Cardiology   ??? PR ECMO/ECLS INITIATION VENO-ARTERIAL N/A 03/19/2017    Procedure: ECMO/ECLS; INITIATION, VENO-ARTERIAL;  Surgeon: Lennie Odor, MD;  Location: MAIN OR [facility];  Service: Cardiac Surgery   ??? PR ECMO/ECLS RMVL PRPH CANNULA OPEN 6 YRS & OLDER Left 03/25/2017    Procedure: ECMO / ECLS PROVIDED BY PHYSICIAN; REMOVAL OF PERIPHERAL (ARTERIAL AND/OR VENOUS) CANNULA(E), OPEN, 6 YEARS AND OLDER;  Surgeon: Alonna Buckler Ikonomidis, MD;  Location: MAIN OR [facility];  Service: Cardiac Surgery   ??? PR EPHYS EVAL W/ ABLATION SUPRAVENT ARRHYTHMIA N/A 07/19/2015    Catheter ablation of cavotricuspid isthmus for atrial flutter [facility])   ??? PR INSER HART PACER XVENOUS ATRIAL N/A 05/30/2015    [employer] dual-chamber pacemaker implant (E.Chung)   ??? PR INSERT/PLACE FLOW DIRECT CATH N/A 03/17/2017    Procedure: Insert Leave In [place];  Surgeon: Marlaine Hind, MD;  Location: [facility] CATH;  Service: Cardiology   ??? PR INSJ/RPLCMT PERM DFB W/TRNSVNS LDS 1/DUAL CHMBR N/A 05/04/2017    Procedure: ICD Implant System (Single/Dual);  Surgeon: Eldred Manges, MD;  Location: [facility] CATH;  Service: Cardiology   ??? PR NEGATIVE PRESSURE WOUND THERAPY DME >50 SQ CM Left 03/25/2017    Procedure: Neg Press Wound Tx (Vac Assist) Incl Topicals, Per Session, Tsa Greater Than/= 50 Cm Squared;  Surgeon: Alonna Buckler Ikonomidis, MD;  Location: MAIN OR [facility];  Service: Cardiac  Surgery   ??? PR PRQ TRLUML CORONARY STENT W/ANGIO ONE ART/BRNCH N/A 03/12/2018    Procedure: Percutaneous Coronary Intervention;  Surgeon: Marlaine Hind, MD;  Location: [facility] CATH;  Service: Cardiology   ??? PR PRQ TRLUML CORONARY STENT W/ANGIO ONE ART/BRNCH N/A 01/27/2020    Procedure: Percutaneous Coronary Intervention;  Surgeon: Marlaine Hind, MD;  Location: [facility] CATH;  Service: Cardiology   ??? PR REBL VES DIRECT,LOW EXTREM Left 03/19/2017    Procedure: Repr Bld Vessel Direct; Lower Extrem;  Surgeon: Boykin Reaper, MD;  Location: MAIN OR [facility];  Service: Vascular   ??? PR REMV ART CLOT ILIAC-POP,LEG INCIS Left 03/25/2017    Procedure: EMBOLECTOMY OR THROMBECTOMY, WITH OR WITHOUT CATHETER; FEMOROPOPLITEAL, AORTOILIAC ARTERY, BY LEG INCISION;  Surgeon: Alonna Buckler Ikonomidis, MD;  Location: MAIN OR [facility];  Service: Cardiac Surgery   ??? PR UPPER GI ENDOSCOPY,BIOPSY N/A 02/28/2016    Procedure: UGI ENDOSCOPY; WITH BIOPSY, SINGLE OR MULTIPLE;  Surgeon: Liane Comber, MD;  [facility]: [facility];  Service: Gastroenterology   ??? PR UPPER GI ENDOSCOPY,DIAGNOSIS N/A 05/07/2016    Procedure: UGI ENDO, INCLUDE ESOPHAGUS, STOMACH, & DUODENUM &/OR JEJUNUM; DX W/WO COLLECTION SPECIMN, BY BRUSH OR WASH;  Surgeon: Modena Nunnery, MD;  [facility]: [facility];  Service: Gastroenterology       Social History/Family History:   Reviewed in EMR, I agree with nursing documentation. Additional pertinent social and family history noted in HPI.   Social History     Tobacco Use   ??? Smoking status: Never Smoker   ??? Smokeless tobacco: Current User     Types: Chew   ??? Tobacco comment: 08/15/19:  Dips 1x a day./ /Pt in process of reducing tobacco use,  dips 1 can every 3 weeks,   Vaping Use   ??? Vaping Use: Never used   Substance Use Topics ??? Alcohol use: Yes     Alcohol/week: 0.0 standard drinks     Comment: rare use: couple times a year, small amount   ??? Drug use: Never     Family History   Problem Relation Age of Onset   ??? Diabetes Mother    ??? Hyperlipidemia Mother    ??? Heart disease Mother    ??? Basal cell carcinoma Mother    ??? Blindness Mother         diabeties   ??? Obesity Mother    ??? Hyperlipidemia Father    ??? Heart disease Father    ??? Lung disease Father    ??? Heart disease Half-Sister    ??? Kidney disease Half-Sister    ??? Gallbladder disease Half-Brother    ??? Obesity Half-Brother    ??? No Known Problems Daughter    ??? No Known Problems Son    ??? Obesity Daughter    ??? Depression Maternal Uncle         Died by suicide in 09-22-2017 by gunshot   ??? Melanoma Neg Hx    ??? Squamous cell carcinoma Neg Hx    ??? Macular degeneration Neg Hx    ??? Glaucoma Neg Hx        ROS  A review of systems reviewed and are negative except as stated in the HPI or noted below.   Constitutional; Neg for fevers   Eyes: Neg for vision changes. Neg for blurry vision.   Ears, nose, mouth, throat: Neg for sore throat, Pos jaw pain  Cardiovascular: Pos for chest pain.   Respiratory: Neg for  cough. Neg for shortness of breath.   Gastrointestinal: Neg for abdominal pain. Neg for nausea. Pos for vomiting.   Genitourinary: Neg for dysuria. Neg for hematuria  Musculoskeletal: Neg for joint pain. Neg for swelling   Skin: Neg for rashes  Neurological: Pos for lightheadedness and LUE tingling. Neg for focal numbness or weakness    Home Medications:  No current facility-administered medications for this encounter.    Current Outpatient Medications:   ???  acetaminophen (TYLENOL) 500 MG tablet, TAKE 2 TABLETS (1000MG ) BY MOUTH EVERY 8 HOURS AS NEEDED FOR PAIN, Disp: 180 tablet, Rfl: 0  ???  albuterol HFA 90 mcg/actuation inhaler, Inhale 2 puffs into the lungs every 6 (six) hours as needed for wheezing or shortness of breath., Disp: 18 g, Rfl: 2  ???  alirocumab (PRALUENT) 150 mg/mL subcutaneous injection, Inject the contents of 1 pen (150 mg total) under the skin every fourteen (14) days., Disp: 6 mL, Rfl: 3  ???  allopurinoL (ZYLOPRIM) 100 MG tablet, Take 2 tablets (200 mg total) by mouth daily., Disp: 180 tablet, Rfl: 0  ???  amiodarone (PACERONE) 200 MG tablet, TAKE 1 TABLET BY MOUTH ONCE DAILY, Disp: 90 tablet, Rfl: 3  ???  apixaban (ELIQUIS) 5 mg Tab, Take 1 tablet (5 mg total) by mouth Two (2) times a day., Disp: 60 tablet, Rfl: 11  ???  atorvastatin (LIPITOR) 80 MG tablet, Take 1 tablet (80 mg total) by mouth daily., Disp: 90 tablet, Rfl: 3  ???  blood sugar diagnostic Strp, Use as directed to check blood glucose Four (4) times a day (before meals and nightly)., Disp: 300 each, Rfl: 3  ???  blood-glucose meter kit, Disp. blood glucose meter kit preferred by patient's insurance. Dx: Diabetes, E11.9, Disp: 1 each, Rfl: 11  ???  bumetanide (BUMEX) 2 MG tablet, Take 1 tablet (2 mg total) by mouth two (2) times a day. On non dialysis days, Disp: 180 tablet, Rfl: 3  ???  cholecalciferol, vitamin D3 25 mcg, 1,000 units,, 1,000 unit (25 mcg) tablet, Take 1 tablet (25 mcg total) by mouth daily., Disp: 100 tablet, Rfl: 11  ???  diclofenac sodium (VOLTAREN) 1 % gel, Apply 2 grams topically Three (3) times a day., Disp: 180 g, Rfl: 11  ???  empagliflozin (JARDIANCE) 10 mg tablet, Take 1 tablet (10 mg total) by mouth every morning., Disp: 30 tablet, Rfl: 3  ???  empty container (SHARPS-A-GATOR DISPOSAL SYSTEM) Misc, Use as directed for sharps disposal, Disp: 1 each, Rfl: 2  ???  ezetimibe (ZETIA) 10 mg tablet, TAKE 1 TABLET BY MOUTH ONCE DAILY, Disp: 90 each, Rfl: 3  ???  FLUoxetine (PROZAC) 40 MG capsule, Take 2 capsules (80 mg total) by mouth daily., Disp: 180 capsule, Rfl: 0  ???  gabapentin (NEURONTIN) 300 MG capsule, Take 1 capsule (300 mg total) by mouth two (2) times a day., Disp: 180 capsule, Rfl: 3  ???  insulin ASPART (NOVOLOG FLEXPEN U-100 INSULIN) 100 unit/mL (3 mL) injection pen, Inject 15 Units under the skin Three (3) times a day before meals., Disp: 45 mL, Rfl: 3  ???  insulin detemir U-100 (LEVEMIR FLEXTOUCH U-100 INSULN) 100 unit/mL (3 mL) injection pen, Inject 30 units under the skin nightly., Disp: 15 mL, Rfl: 3  ???  insulin syringe-needle U-100 0.3 mL 31 gauge x 5/16 Syrg, Use to inject insulin twice daily with meals, Disp: 60 each, Rfl: 0  ???  isosorbide mononitrate (IMDUR) 60 MG 24 hr tablet, Take 1 tablet (60  mg total) by mouth daily. DO NOT TAKE on dialysis days, Disp: 90 tablet, Rfl: 3  ???  lancets Misc, Use as directed to check blood glucose Four (4) times a day (before meals and nightly)., Disp: 300 each, Rfl: 3  ???  lancets Misc, Disp. lancets #200 or amount allowed, Testing QID, Dx: E11.65, Z79.4. (DM type 2 , Insulin Dep), Disp: 200 each, Rfl: 11  ???  loperamide (IMODIUM) 2 mg capsule, Take 1 capsule twice a day and another capsule after each episode of diarrhea, up to 8 pills per day., Disp: 240 capsule, Rfl: 2  ???  metoprolol succinate (TOPROL XL) 100 MG 24 hr tablet, Take 1 tablet (100 mg total) by mouth daily., Disp: 30 tablet, Rfl: 1  ???  mirtazapine (REMERON) 7.5 MG tablet, Take 1 tablet (7.5 mg total) by mouth nightly., Disp: 30 tablet, Rfl: 0  ???  miscellaneous medical supply (BLOOD PRESSURE CUFF) Misc, Measure BP once a day or if any symptoms, Disp: 1 each, Rfl: 0  ???  nitroglycerin (NITROLINGUAL) 0.4 mg/dose spray, Place 1 spray under the tongue every five (5) minutes as needed for chest pain. pT MUST HAVE SPRAY DUE TO DRY MOUTH AND PILLS DO NOT DISSOLVE, Disp: 12 g, Rfl: 12  ???  omeprazole (PRILOSEC) 40 MG capsule, Take 1 capsule (40 mg total) by mouth Two (2) times a day (30 minutes before a meal)., Disp: 180 capsule, Rfl: 3  ???  ondansetron (ZOFRAN) 4 MG tablet, Take 1 tablet (4 mg total) by mouth every twelve (12) hours as needed., Disp: 15 tablet, Rfl: 0  ???  semaglutide (OZEMPIC) 0.25 mg or 0.5 mg(2 mg/1.5 mL) PnIj injection, Inject 0.5 mg under the skin every seven (7) days., Disp: 4.5 mL, Rfl: 3  ???  sevelamer (RENVELA) 800 mg tablet, TAKE 2 TABLETS (1600MG ) BY MOUTH THREE TIMES DAILY WITH MEALS, Disp: 540 tablet, Rfl: 3  ???  ticagrelor (BRILINTA) 90 mg Tab, TAKE 1 TABLET BY MOUTH TWICE DAILY, Disp: 180 tablet, Rfl: 0  ???  UNIFINE PENTIPS 31 gauge x 5/16 (8 mm) Ndle, USE WITH INSULIN AND VICTOZA 5 TIMES DAILY, Disp: 400 each, Rfl: 3    ALLERGIES:   Losartan and Morphine       Physical Exam     ED Triage Vitals [03/03/20 1834]   Enc Vitals Group      BP 172/86      Heart Rate 90      SpO2 Pulse       Resp 25      Temp 36.7 ??C (98.1 ??F)      Temp Source Oral      SpO2 95 %     Vital signs reviewed.  GENERAL: ill-appearing in no acute distress, appears uncomfortable due to chest pain  HEENT: airway is patent, moist mucus membranes, oropharynx clear without lesions, exudates, thrush  EYES: pupils equal and reactive to light bilaterally, no scleral icterus, conjunctivae clear  NECK: trachea midline, no cervical lymphadenopathy  CARDIAC: normal rate, regular rhythm, normal S1 and S2, no murmurs, 2+ peripheral pulses x4, normal capillary refill  PULMONARY: normal work of breathing, good air movement bilaterally, lungs are clear to auscultation bilaterally without wheezes, crackles, rhonchi  ABDOMINAL: normal bowel sounds, abdomen soft, nontender, nondistended, no hepatomegaly or splenomegaly  MSK: no joint deformity or swelling, moves all 4 extremities without difficulty, no lower extremity edema  NEURO: alert and oriented ??3, normal mental status, PERRL, EOMI, 5/5 strength in all 4 extremities, no  focal sensory deficits to light touch in distal extremities  SKIN: Warm and dry without rashes  PSYCH: Mood and affect are normal        Labs and Radiology     Labs Reviewed   HIGH SENSITIVITY TROPONIN I - SERIAL - Abnormal; Notable for the following components:       Result Value    hsTroponin I 77 (*)     All other components within normal limits   BASIC METABOLIC PANEL - Abnormal; Notable for the following components:    Potassium 4.6 (*)     BUN 31 (*)     Creatinine 5.13 (*)     EGFR CKD-EPI Non-African American, Male 15 (*)     EGFR CKD-EPI African American, Male 76 (*)     Glucose 257 (*)     All other components within normal limits   PROTIME-INR - Abnormal; Notable for the following components:    PT 14.1 (*)     All other components within normal limits   HIGH SENSITIVITY TROPONIN I - 2 HOUR SERIAL - Abnormal; Notable for the following components:    hsTroponin I 88 (*)     delta hsTroponin I 11 (*)     All other components within normal limits   CBC W/ AUTO DIFF - Abnormal; Notable for the following components:    RBC 3.98 (*)     HGB 11.2 (*)     HCT 34.5 (*)     RDW 15.9 (*)     All other components within normal limits   COVID-19 PCR - Normal    Narrative:     --  This test was performed using the Cepheid Xpert Xpress SARS-CoV-2 assay which has been validated by the CLIA-certified, CAP-inspected [facility] and [facility]. FDA has granted Emergency Use Authorization for this test.   This real-time RT-PCR test detects SARS-CoV-2 by targeting the N2 and E genes. Negative results do not preclude SARS-CoV-2 infection and should not be used as the sole basis for patient management decisions. Negative results must be combined with clinical observations, patient history, and epidemiological information.   Information for providers and patients can be found here: [email or web address]   MAGNESIUM - Normal   CBC W/ DIFFERENTIAL    Narrative:     The following orders were created for panel order CBC w/ Differential.  Procedure                               Abnormality         Status                     ---------                               -----------         ------                     CBC w/ [phone]]         Abnormal            Final result                 Please view results for these tests on the individual orders.     XR Chest 1 view Portable   Final Result  Unchanged mild pulmonary edema.         ED POCUS Chest Bilateral    (Results Pending)          EKG   Sinus rhythm rate of 91 bpm.  LVH with  repolarization abnormalities and worsening ST depressions in leads II and aVF compared to prior EKG.  No ST segment elevation meeting STEMI criteria.      Procedures   Procedures     ED Treatment   Medications given in the ED:   Medications   aspirin tablet 325 mg (325 mg Oral Given 03/03/20 1913)               Portions of this record have been created using [employer]. Dictation errors have been sought, but Kendrix not have been identified and corrected.     Patient was seen with medical student, Rami[facility]    Documentation assistance was provided by Fabian Sharp on March 03, 2020 at 6:45 PM for Army Melia, MD.    Documentation assistance was provided by the scribe in my presence.  The documentation recorded by the scribe has been reviewed by me and accurately reflects the services I personally performed.         Bryson Ha, MD  03/04/20 1758       Bryson Ha, MD  03/04/20 [phone]

## 2020-03-04 NOTE — Unmapped (Signed)
Patient presenting with L sided chest pressure which began suddenly 45 minutes prior to arrival while he was sitting in a recliner watching TV. Chest pressure radiates to the L arm. Numbness to mouth. One episode of emesis prior to arrival. Tried 3 sprays of nitroglycerin without improvement of chest pressure.

## 2020-03-05 MED ORDER — GABAPENTIN 300 MG CAPSULE
ORAL_CAPSULE | Freq: Two times a day (BID) | ORAL | 3 refills | 90 days
Start: 2020-03-05 — End: 2020-04-04
  Filled 2020-04-03: qty 180, 90d supply, fill #0

## 2020-03-05 NOTE — Unmapped (Signed)
I have reviewed the battery, threshold, lead impedance, and overall device/lead status, diagnostic data including arrhythmic events and therapies on the device evaluation on Jeremy Oliver, [id number].       Pollyann Kennedy, [place]

## 2020-03-05 NOTE — Unmapped (Signed)
This patient has been reviewed for Complex Case Management services and is not eligible at this time. To have this patient reevaluated for Complex Case Management please place AMB Referral to Case Management to the Personal Health Advocate Department.    Alternative management of care needs: Patient enrolled in Chronic Case Management program       Laurene Footman - High Risk Care Coordinator   [facility] Clinical Services  [address], [address] [place], [place] [address]  p. [phone]  [email or web address]

## 2020-03-07 ENCOUNTER — Ambulatory Visit: Admit: 2020-03-07 | Discharge: 2020-03-08 | Payer: MEDICARE

## 2020-03-07 DIAGNOSIS — Z794 Long term (current) use of insulin: Principal | ICD-10-CM

## 2020-03-07 DIAGNOSIS — Z09 Encounter for follow-up examination after completed treatment for conditions other than malignant neoplasm: Principal | ICD-10-CM

## 2020-03-07 DIAGNOSIS — Z992 Dependence on renal dialysis: Principal | ICD-10-CM

## 2020-03-07 DIAGNOSIS — N186 End stage renal disease: Principal | ICD-10-CM

## 2020-03-07 DIAGNOSIS — E1122 Type 2 diabetes mellitus with diabetic chronic kidney disease: Principal | ICD-10-CM

## 2020-03-07 DIAGNOSIS — I214 Non-ST elevation (NSTEMI) myocardial infarction: Principal | ICD-10-CM

## 2020-03-07 MED ORDER — ACETAMINOPHEN 500 MG TABLET
ORAL_TABLET | ORAL | 0 refills | 0.00000 days | Status: CP
Start: 2020-03-07 — End: 2021-03-07
  Filled 2020-03-07: qty 180, 30d supply, fill #0

## 2020-03-07 MED FILL — METOPROLOL SUCCINATE ER 100 MG TABLET,EXTENDED RELEASE 24 HR: 15 days supply | Qty: 15 | Fill #1 | Status: AC

## 2020-03-07 MED FILL — METOPROLOL SUCCINATE ER 100 MG TABLET,EXTENDED RELEASE 24 HR: ORAL | 15 days supply | Qty: 15 | Fill #1

## 2020-03-07 MED FILL — ACETAMINOPHEN 500 MG TABLET: 30 days supply | Qty: 180 | Fill #0 | Status: AC

## 2020-03-07 NOTE — Unmapped (Signed)
Phone call made by this clinician was returned by [name] wife. Full release on file to talk with her.    Mrs. Jeremy Oliver shared that:  -[name] had 2 heart attacks in 2 weeks. He has been saying that health issues are too much and he has been wanting to go home. He's tired. No active intent to hurt himself indicated. She says he was given 6-12 months to live.  -Their first two biological children Jeremy Oliver and Jeremy Oliver had been removed from their custody when they were babies. Jeremy Oliver made contact recently. They learned Jeremy Oliver had died.  -There are other stressors they're contending with, not specified.  - [name] has an appointment tomorrow with Dr. Hulan Oliver.    Jeremy Oliver asked about getting [name] back into counseling. He was last seen in June and can schedule a return counseling visit if he wants to. Clinician asked whether this is what he wants or what she wants. Discussed her concern about [name] giving up. Clinician helped her to consider the impact of multiple significant medical events for him. Discussed ways to make the best use of the time they have together. Message sent to Administrative Team to let them know it's alright to schedule a return counseling visit if [name] wants to. Jeremy Oliver expressed appreciation for the call.    This message will be copied to Dr. Hulan Oliver.

## 2020-03-07 NOTE — Unmapped (Signed)
CARE MANAGEMENT ENCOUNTER    Care Management received request for return call to [name].   Chart reviewed. Patient need not specified.  MyChart use by patient is active: yes    Reached [name] voicemail and left my confidential voicemail. Let him know he can leave me a voicemail or send me a message via MyChart.

## 2020-03-08 ENCOUNTER — Ambulatory Visit
Admit: 2020-03-08 | Discharge: 2020-03-09 | Payer: MEDICARE | Attending: Cardiovascular Disease | Primary: Cardiovascular Disease

## 2020-03-08 DIAGNOSIS — E1122 Type 2 diabetes mellitus with diabetic chronic kidney disease: Principal | ICD-10-CM

## 2020-03-08 DIAGNOSIS — Z992 Dependence on renal dialysis: Principal | ICD-10-CM

## 2020-03-08 DIAGNOSIS — I25118 Atherosclerotic heart disease of native coronary artery with other forms of angina pectoris: Principal | ICD-10-CM

## 2020-03-08 DIAGNOSIS — N184 Chronic kidney disease, stage 4 (severe): Principal | ICD-10-CM

## 2020-03-08 DIAGNOSIS — N186 End stage renal disease: Principal | ICD-10-CM

## 2020-03-08 DIAGNOSIS — Z794 Long term (current) use of insulin: Principal | ICD-10-CM

## 2020-03-08 MED ORDER — RANOLAZINE ER 500 MG TABLET,EXTENDED RELEASE,12 HR
ORAL_TABLET | Freq: Two times a day (BID) | ORAL | 6 refills | 30.00000 days | Status: SS
Start: 2020-03-08 — End: 2020-06-11
  Filled 2020-03-08: qty 52, 26d supply, fill #0

## 2020-03-08 MED FILL — RANOLAZINE ER 500 MG TABLET,EXTENDED RELEASE,12 HR: 26 days supply | Qty: 52 | Fill #0 | Status: AC

## 2020-03-08 NOTE — Unmapped (Signed)
PCP:  Kurtis Bushman, MD    Patient ID: Jeremy Oliver is a 49 y.o. male.    Assessment and Plan:   1. Coronary artery disease involving native coronary artery of native heart without angina pectoris  2. Chronic heart failure with reduced ejection fraction and diastolic dysfunction (CMS-[name])  3. Paroxysmal atrial fibrillation (CMS-[name])  4. Essential hypertension (RAF-[name])  5. CKD 5    Afib burden has improved this month and he remains on amiodarone  He is otherwise on excellent medical therapy provided by Dr. Cherly Hensen and Dr. Hulan Fess    PCI in October to [facility] with improvement in chest discomfort, but has ongoing chest pain during dialysis. Continue Imdur 60 mg and use SL nitroglycerin spray as needed. Will start ranexa 500 mg BID and monitor anginal symptoms.  Ranexa is not typically given to dialysis patients, however in this circumstance I believe it is a reasonable therapy.  It is rapidly metabolized in the liver and should be safe particularly at low dose   Will hold off on AV fistula creation given recent hospitalizations and CHF.      We discussed his overall guarded prognosis.  He has done much better than expected given the severity of his previous myocardial infarction and cardiogenic shock requiring ECMO.  However he has had progressive and recurrent coronary artery disease and does not have any options for advanced heart failure therapy or transplant given his multiple comorbidities.  He wants to remain full code for now.  He understands that if he continues to have a decline in his ejection fraction or recurrent hospitalizations for heart failure that we Preast need to consider and discuss a palliative care strategy.  I am concerned that these discussions Doe need to be completed sooner rather than later given his recent difficulty completing dialysis due to recurrent angina.    Return in about 3 months (around 06/06/2020).    Dictated using Animal nutritionist, please excuse typos    Elpidio Anis  MD [facility]  Division of Cardiology   [place] [facility] [phone]    Subjective:     Jeremy Oliver returns for follow-up.  He reports his chest pressure has significantly improved after his recent PCI. He is still experiencing chest pressure with moderate exertion but notes he feels better since his PCI; specifically experiences chest pain in the setting of blood pressure spiking. Reports frequent hypotensive episodes at dialysis.     Interval hx 12/9: He was hospitalized in November with CHF and recurrent ACS with troponin that peaked at 6076 on 11/26. In December he revisited ED with troponin that peaked at 88 on 12/4. He states feels terrible after dialysis with weakness and chest pressure, and also experiences chest pain during dialysis. Denies any recurrent palpitations, bleeding events, or syncope.         Patient Active Problem List   Diagnosis   ??? 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 Idiopathic chronic gout of left knee   ??? Hypercholesteremia   ??? Diabetes mellitus with coincident hypertension (CMS-[name])   ??? 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 Osteoarthritis   ??? Chronic nonalcoholic liver disease   ??? GERD (gastroesophageal reflux disease)   ??? Mild intermittent asthma without complication   ??? Chronic  pain syndrome   ??? Paroxysmal atrial fibrillation (CMS-[name])   ??? OSA (obstructive sleep apnea)   ??? Tobacco use disorder   ??? Status post cardiac pacemaker procedure   ??? Diabetes mellitus with gastroparesis (CMS-[name])   ??? Cardiac pacemaker in situ   ??? Recurrent major depressive disorder, in full remission (CMS-[name])   ??? NSTEMI (non-ST elevation myocardial infarction) (CMS-[name])   ??? Adjustment disorder with mixed anxiety and depressed mood   ??? HFrEF (heart failure with reduced ejection fraction) (CMS-[name])   ??? Hyperkalemia   ??? Acquired left foot drop   ??? Decreased sensation of lower extremity, left   ??? Chronic anticoagulation   ??? Type 2 diabetes mellitus with stage 4 chronic kidney disease, with long-term current use of insulin (CMS-[name])   ??? Situational stress   ??? Cough   ??? QT prolongation   ??? ESRD needing dialysis (CMS-[name])   ??? Palliative care by specialist   ??? Angina pectoris (CMS-[name])   ??? Psychophysiological insomnia     Most Recent Imaging:  LHC  01/27/20  1. Coronary artery disease including 80% LMCA, 90% ostial LAD stenosis.  2. Successful PCI to the LMCA/LAD with with the placement of a 3.0 x 16 mm Synergy DES with excellent angiographic result and TIMI 3 flow.  3. Kissing balloon inflation completed in the LMCA/ LAD/LCx.    08/19/19  1. There is??significant 2-vessel coronary artery disease. ??There is a 90% proximal LAD stenosis which extends into the proximal-LAD stent and 80% proximal LCx stenosis. The mid-LAD stent has mild ISR. The mid and distal RCA stents are widely patent. The RCA has moderate diffuse distal disease.   2. Elevated??left ventricular filling pressures (LVEDP = 24??mm Hg).  3. Successful PCI to the??LAD and LCx??with the placement of a Synergy DES x2??with excellent angiographic result and TIMI 3 flow.  4. Rescue balloon angioplasty of the DIAG2 vessel that was occluded after dilation of the LAD stent    10/22/18  1.   Significant 90% stenosis of distal RCA, severe proximal LCx stenosis, and diffuse severe distal LAD stenosis  2.   Elevated LVEDP = 22   3.   Successful DES to the distal RCA with the placement of a Synergy 4.0 x 20 with excellent angiographic result and TIMI 3 flow.    05/12/18  5. Coronary artery disease including diffuse moderate to severe lesions in the left coronary system, with particular emphasis on the 90% proximal to mid LAD in-stent restenosis.  6. Successful PCI to the proximal to mid LAD with the placement of a Synergy drug eluting stent with excellent angiographic result and TIMI 3 flow.  7. Subsequent impaired flow at an adjacent moderate diagonal, which was small and had pre-existing ostial disease. Recrossed and treated with balloon angioplasty with restored flow.  8. Recent stent to RPDA/RPLA bifurcation widely patent.  9. Normal left ventricular filling pressures (LVEDP = 8 mm Hg).    03/12/18  1. Coronary artery disease including??severe mid RCA, and distal RCA bifurcation stenosis.  2. Left heart cath deferred  3. Successful??PCI??to the mid RCA and RPLA/RPDA bifurcation??with the placement of a Synergy DES x??with excellent angiographic result and TIMI 3 flow.    Echo 02/24/20    1. Technically difficult study.    2. Ultrasound enhancing agent utilized to improve endocardial border definition.    3. The left ventricle is mildly dilated in size with normal wall thickness.    4. The left ventricular systolic function is severely decreased,  LVEF is visually estimated at 20-25%.    5. The apical anterior wall, apical cap, and mid anterior wall are hypokinetic.    6. There is grade III diastolic dysfunction (severely elevated filling pressure).    7. The left atrium is mildly to moderately dilated in size.    8. The right ventricle is normal in size, with severely reduced systolic function.    9. The right atrium is mildly dilated  in size.    PVL Vessel Mapping Hemodialysis 11/21/19  Right: Normal Doppler waveforms detected in the brachial and ulnar arteries. No plaque in the brachial artery. Radial artery occluded and fills distally via a collateral. See chart above for vein diameters.  Left: Normal Doppler waveforms detected in the brachial, radial and ulnar arteries. No plaque in the brachial artery. No plaque in the radial artery. See chart above for vein diameters.    ECG 07/28/19  SINUS RHYTHM WITH 1ST DEGREE AV BLOCK  T WAVE ABNORMALITY, CONSIDER LATERAL ISCHEMIA    Current Outpatient Medications   Medication Sig Dispense Refill   ??? acetaminophen (TYLENOL) 500 MG tablet TAKE 2 TABLETS (1000MG ) BY MOUTH EVERY 8 HOURS AS NEEDED FOR PAIN 180 tablet 0   ??? albuterol HFA 90 mcg/actuation inhaler Inhale 2 puffs into the lungs every 6 (six) hours as needed for wheezing or shortness of breath. 18 g 2   ??? alirocumab (PRALUENT) 150 mg/mL subcutaneous injection Inject the contents of 1 pen (150 mg total) under the skin every fourteen (14) days. 6 mL 3   ??? allopurinoL (ZYLOPRIM) 100 MG tablet Take 2 tablets (200 mg total) by mouth daily. 180 tablet 0   ??? amiodarone (PACERONE) 200 MG tablet TAKE 1 TABLET BY MOUTH ONCE DAILY 90 tablet 3   ??? apixaban (ELIQUIS) 5 mg Tab Take 1 tablet (5 mg total) by mouth Two (2) times a day. 60 tablet 11   ??? atorvastatin (LIPITOR) 80 MG tablet Take 1 tablet (80 mg total) by mouth daily. 90 tablet 3   ??? blood sugar diagnostic Strp Use as directed to check blood glucose Four (4) times a day (before meals and nightly). 300 each 3   ??? blood-glucose meter kit Disp. blood glucose meter kit preferred by patient's insurance. Dx: Diabetes, E11.9 1 each 11   ??? bumetanide (BUMEX) 2 MG tablet Take 1 tablet (2 mg total) by mouth two (2) times a day. On non dialysis days 180 tablet 3   ??? cholecalciferol, vitamin D3 25 mcg, 1,000 units,, 1,000 unit (25 mcg) tablet Take 1 tablet (25 mcg total) by mouth daily. 100 tablet 11   ??? diclofenac sodium (VOLTAREN) 1 % gel Apply 2 grams topically Three (3) times a day. 180 g 11   ??? empagliflozin (JARDIANCE) 10 mg tablet Take 1 tablet (10 mg total) by mouth every morning. 30 tablet 3   ??? empty container (SHARPS-A-GATOR DISPOSAL SYSTEM) Misc Use as directed for sharps disposal 1 each 2   ??? ezetimibe (ZETIA) 10 mg tablet TAKE 1 TABLET BY MOUTH ONCE DAILY 90 each 3   ??? FLUoxetine (PROZAC) 40 MG capsule Take 2 capsules (80 mg total) by mouth daily. 180 capsule 0   ??? gabapentin (NEURONTIN) 300 MG capsule Take 1 capsule (300 mg total) by mouth two (2) times a day. 180 capsule 2   ??? insulin ASPART (NOVOLOG FLEXPEN U-100 INSULIN) 100 unit/mL (3 mL) injection pen Inject 15 Units under the skin Three (3) times a day before meals. 45 mL  3   ??? insulin detemir U-100 (LEVEMIR FLEXTOUCH U-100 INSULN) 100 unit/mL (3 mL) injection pen Inject 30 units under the skin nightly. 15 mL 3   ??? insulin syringe-needle U-100 0.3 mL 31 gauge x 5/16 Syrg Use to inject insulin twice daily with meals 60 each 0   ??? isosorbide mononitrate (IMDUR) 60 MG 24 hr tablet Take 1 tablet (60 mg total) by mouth daily. DO NOT TAKE on dialysis days 90 tablet 3   ??? lancets Misc Use as directed to check blood glucose Four (4) times a day (before meals and nightly). 300 each 3   ??? lancets Misc Disp. lancets #200 or amount allowed, Testing QID, Dx: E11.65, Z79.4. (DM type 2 , Insulin Dep) 200 each 11   ??? loperamide (IMODIUM) 2 mg capsule Take 1 capsule twice a day and another capsule after each episode of diarrhea, up to 8 pills per day. 240 capsule 2   ??? metoprolol succinate (TOPROL XL) 100 MG 24 hr tablet Take 1 tablet (100 mg total) by mouth daily. 30 tablet 1   ??? mirtazapine (REMERON) 7.5 MG tablet Take 1 tablet (7.5 mg total) by mouth nightly. 30 tablet 0   ??? miscellaneous medical supply (BLOOD PRESSURE CUFF) Misc Measure BP once a day or if any symptoms 1 each 0   ??? omeprazole (PRILOSEC) 40 MG capsule Take 1 capsule (40 mg total) by mouth Two (2) times a day (30 minutes before a meal). 180 capsule 3   ??? ondansetron (ZOFRAN) 4 MG tablet Take 1 tablet (4 mg total) by mouth every twelve (12) hours as needed. 15 tablet 0   ??? semaglutide (OZEMPIC) 0.25 mg or 0.5 mg(2 mg/1.5 mL) PnIj injection Inject 0.5 mg under the skin every seven (7) days. 4.5 mL 3   ??? sevelamer (RENVELA) 800 mg tablet TAKE 2 TABLETS (1600MG ) BY MOUTH THREE TIMES DAILY WITH MEALS 540 tablet 3   ??? ticagrelor (BRILINTA) 90 mg Tab TAKE 1 TABLET BY MOUTH TWICE DAILY 180 tablet 0   ??? UNIFINE PENTIPS 31 gauge x 5/16 (8 mm) Ndle USE WITH INSULIN AND VICTOZA 5 TIMES DAILY 400 each 3   ??? nitroglycerin (NITROLINGUAL) 0.4 mg/dose spray Place 1 spray under the tongue every five (5) minutes as needed for chest pain. pT MUST HAVE SPRAY DUE TO DRY MOUTH AND PILLS DO NOT DISSOLVE (Patient not taking: Reported on 03/08/2020) 12 g 12   ??? ranolazine (RANEXA) 500 MG 12 hr tablet Take 1 tablet (500 mg total) by mouth Two (2) times a day. 60 tablet 6     No current facility-administered medications for this visit.       Allergies   Allergen Reactions   ??? Losartan      Hyperkalemia (K>6)   ??? Morphine Palpitations and Anxiety     Medication error: was administered 20mL instead of 2mL       Review of Systems  Pertinent items are noted in HPI.      Objective:      Physical Exam:  VITAL SIGNS: BP 167/91  - Pulse 87  - Ht 188 cm (6' 2)  - Wt 72.6 kg (160 lb) Comment: DRY WEIGHT - SpO2 97%  - BMI 20.54 kg/m??   GENERAL: NAD  HEENT: Normocephalic and atraumatic.Conjunctivae and sclerae clear and anicteric. No xanthelasma.   NECK: Supple, without masses, thyroid enlargement or adenopathy.  CARDIOVASCULAR: RRR without m/r/g.  RESPIRATORY: Normal respiratory effort without use of accessory muscles. Clear to auscultation  bilaterally.  ABDOMEN: Soft, not tender or distended, with audible bowel sounds. No palpable organ enlargement or abnormal masses.  EXTREMITIES:  No pretibial or ankle edema. Ambulatory ability satisfactory.  SKIN: No rashes, ecchymosis or petechiae.  NEUROLOGIC: Appropriate mood and affect. Alert and oriented to person, place, and time. No gross motor or sensory deficits evident.    Wt Readings from Last 6 Encounters:   03/08/20 72.6 kg (160 lb)   03/07/20 72.6 kg (160 lb)   02/09/20 (!) 160.5 kg (353 lb 12.8 oz)   01/28/20 (!) 169.8 kg (374 lb 5.5 oz)   12/15/19 (!) 161 kg (355 lb)   11/21/19 (!) 161.6 kg (356 lb 4.2 oz)       Please excuse typos, dictation completed via Conservation officer, historic buildings      _____________________________________________________________  [employer] Attestation:         This document serves as a record of the services and decisions performed by Elpidio Anis, MD on 03/08/2020. It was created on his behalf by Inez Pilgrim, a trained medical scribe. The creation of this document is based on the provider's statements and observations that were conveyed to the medical scribe during the patient's encounter.     (The information in this document, created by the medical scribe for me, accurately reflects the services I personally performed and the decisions made by me. I have reviewed and approved this document for accuracy.)     Elpidio Anis  MD [facility]  Division of Cardiology   [place] [facility] [phone]  Pager [phone]

## 2020-03-08 NOTE — Unmapped (Cosign Needed)
[facility] at [facility]     Type of visit:  face to face    Reason for visit: F/U    General Consent to Treat (GCT) for non-epic video visits only: Verbal consent    Diabetes:  ??? Regularly checking blood sugars?: yes  o If yes, when? Complete log for past 7 days  Hypertension:  ??? Have blood pressure cuff at home?: yes- arm cuff  ??? Regularly checking blood pressure?: yes    Screening BP- 143/82  82    Omron BPs (complete if screening BP has a systolic  > 139 or diastolic > 89)  BP#1 150/76  82   BP#2 148/72  80  BP#3 126/75  76    Average BP 141/74  79  (please note this as a comment in vitals)     Allergies reviewed: Yes    Medication reviewed: Yes  Pended refills? No    [name] reviewed and updated in Epic:    We are working to make sure all of our patients??? wishes are updated in Epic and part of that is documenting a Environmental health practitioner for each patient  A Health Care Decision Rodena Piety is someone you choose who can make health care decisions for you if you are not able ??? who would you most want to do this for you????  is already up to date.    [name] [facility]): Arista,[name] - [phone]    [name], [facility]: Hallum,Bridgette - Daughter - [phone]            COVID-19 Vaccine Summary  Which COVID-19 Vaccine was administered  Janssen  Type:  Janssen-Complete  Dates Given:  09/06/2019

## 2020-03-08 NOTE — Unmapped (Signed)
[facility] - [facility]  Established Visit    Reason for Visit:  ED and hospital follow-up    1. NSTEMI (non-ST elevation myocardial infarction) (CMS-[name])    2. Hospital discharge follow-up    3. Type 2 diabetes mellitus with chronic kidney disease on chronic dialysis, with long-term current use of insulin (CMS-[name])    4. ESRD on dialysis (CMS-[name])            Assessment/Plans:  NSTEMI:  Recent ED Visit for this and left AMA.  No recurrent chest pain. Discuss extensively with him and wife that I advise against signing out AMA based on value of troponin and to be fully evaluated with discharge plan in place.     T2DM with CKD on dialysis:    Lab Results   Component Value Date    A1C 7.2 (H) 04/14/2019   asked him to bring in records of last A1c from his dialysis center, which he has recently had.     ESRD on Dialysis:  Had long discussion about continuing dialysis, his quality of life, possibility of home dialysis.  Encourage him to speak with nephrologist about home dialysis but that decision would also need to include wife as she would have to assist with this.  Recommended engaging with palliative care again.  Will send message to Cicero Duck to see if she would be able to see him as an outpatient.  Currently patient and wife decline outpatient palliative care with on of our Palliative Care physicians, but will continue to offer.    Return in about 2 months (around 05/08/2020). face to face    Medication adherence and barriers to the treatment plan have been addressed. Opportunities to optimize healthy behaviors have been discussed. Patient / caregiver voiced understanding.        __________________________________________________________    HPI:    Hospitalized in Nov for NSTEMI and AFib w/ RVR requiring stay in CICU for AMS and acute dyspnea.London Pepper was started during that hospitalization. Remeron was started for anxiety.   ED visit for NSTEMI 12/4.  Left AMA because his hsTpn was < 1000.  Also states that he felt better after he threw up in the parking lot before he got into the ED.     Has made appt with Diane to restart counseling.    Wife states that [name] has made comments multiple times that he doesn't want to do dialysis anymore. Doesn't want to deal with chest pain and being in the hospital.  Feels drained after dialysis.  Wants to do home dialysis, but wife is worried about something bad happening       Medications:  Reviewed in EPIC      Physical Exam:   Vital Signs:   BP 143/82 (BP Site: L Arm, BP Position: Sitting, BP Cuff Size: Large)  - Pulse 82  - Temp 36.9 ??C (98.4 ??F) (Oral)  - Wt 72.6 kg (160 lb) Comment: dry weight - SpO2 98%  - BMI 20.54 kg/m??   Gen: Well appearing, NAD  CV: RRR, no murmurs  Pulm: CTA bilaterally, no crackles or wheezes  GI: Soft, NTND, normal BS. No HSM.  MSK: No edema

## 2020-03-08 NOTE — Unmapped (Addendum)
Start Ranexa 500 mg twice daily  Return to see me in 2 months

## 2020-03-08 NOTE — Unmapped (Signed)
If you continue to be very sleepy with the Remeron, please let me know so we can adjust how you take it.     I will talk with Healther Boykin to see if she is in clinic.

## 2020-03-08 NOTE — Unmapped (Signed)
PCP:  Kurtis Bushman, MD    Patient ID: Jeremy Oliver is a 49 y.o. male.    Assessment and Plan:   1. Coronary artery disease involving native coronary artery of native heart without angina pectoris  2. Chronic heart failure with reduced ejection fraction and diastolic dysfunction (CMS-[name])  3. Paroxysmal atrial fibrillation (CMS-[name])  4. Essential hypertension (RAF-[name])  5. CKD 5    Afib burden has improved this month and he remains on amiodarone  He is otherwise on excellent medical therapy provided by Dr. Cherly Hensen and Dr. Hulan Fess    PCI in October to [facility] with improvement in chest discomfort, but has ongoing chest pain during dialysis. Continue Imdur 60 mg and use SL nitroglycerin spray as needed. Will start ranexa 500 mg BID and monitor anginal symptoms.  Ranexa is not typically given to dialysis patients, however in this circumstance I believe it is a reasonable therapy.  It is rapidly metabolized in the liver and should be safe particularly at low dose   Will hold off on AV fistula creation given recent hospitalizations and CHF.      We discussed his overall guarded prognosis.  He has done much better than expected given the severity of his previous myocardial infarction and cardiogenic shock requiring ECMO.  However he has had progressive and recurrent coronary artery disease and does not have any options for advanced heart failure therapy or transplant given his multiple comorbidities.  He wants to remain full code for now.  He understands that if he continues to have a decline in his ejection fraction or recurrent hospitalizations for heart failure that we Preast need to consider and discuss a palliative care strategy.  I am concerned that these discussions Doe need to be completed sooner rather than later given his recent difficulty completing dialysis due to recurrent angina.    Return in about 3 months (around 06/06/2020).    Dictated using Animal nutritionist, please excuse typos    Elpidio Anis  MD [facility]  Division of Cardiology   [place] [facility] [phone]    Subjective:     Mr. [name] returns for follow-up.  He reports his chest pressure has significantly improved after his recent PCI. He is still experiencing chest pressure with moderate exertion but notes he feels better since his PCI; specifically experiences chest pain in the setting of blood pressure spiking. Reports frequent hypotensive episodes at dialysis.     Interval hx 12/9: He was hospitalized in November with CHF and recurrent ACS with troponin that peaked at 6076 on 11/26. In December he revisited ED with troponin that peaked at 88 on 12/4. He states feels terrible after dialysis with weakness and chest pressure, and also experiences chest pain during dialysis. Denies any recurrent palpitations, bleeding events, or syncope.         Patient Active Problem List   Diagnosis   ??? 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 Idiopathic chronic gout of left knee   ??? Hypercholesteremia   ??? Diabetes mellitus with coincident hypertension (CMS-[name])   ??? 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 Osteoarthritis   ??? Chronic nonalcoholic liver disease   ??? GERD (gastroesophageal reflux disease)   ??? Mild intermittent asthma without complication   ??? Chronic  pain syndrome   ??? Paroxysmal atrial fibrillation (CMS-[name])   ??? OSA (obstructive sleep apnea)   ??? Tobacco use disorder   ??? Status post cardiac pacemaker procedure   ??? Diabetes mellitus with gastroparesis (CMS-[name])   ??? Cardiac pacemaker in situ   ??? Recurrent major depressive disorder, in full remission (CMS-[name])   ??? NSTEMI (non-ST elevation myocardial infarction) (CMS-[name])   ??? Adjustment disorder with mixed anxiety and depressed mood   ??? HFrEF (heart failure with reduced ejection fraction) (CMS-[name])   ??? Hyperkalemia   ??? Acquired left foot drop   ??? Decreased sensation of lower extremity, left   ??? Chronic anticoagulation   ??? Type 2 diabetes mellitus with stage 4 chronic kidney disease, with long-term current use of insulin (CMS-[name])   ??? Situational stress   ??? Cough   ??? QT prolongation   ??? ESRD needing dialysis (CMS-[name])   ??? Palliative care by specialist   ??? Angina pectoris (CMS-[name])   ??? Psychophysiological insomnia     Most Recent Imaging:  [facility]  01/27/20  1. Coronary artery disease including 80% LMCA, 90% ostial LAD stenosis.  2. Successful PCI to the LMCA/LAD with with the placement of a 3.0 x 16 mm Synergy DES with excellent angiographic result and TIMI 3 flow.  3. Kissing balloon inflation completed in the LMCA/ LAD/LCx.    08/19/19  1. There is??significant 2-vessel coronary artery disease. ??There is a 90% proximal LAD stenosis which extends into the proximal-LAD stent and 80% proximal LCx stenosis. The mid-LAD stent has mild ISR. The mid and distal RCA stents are widely patent. The RCA has moderate diffuse distal disease.   2. Elevated??left ventricular filling pressures (LVEDP = 24??mm Hg).  3. Successful PCI to the??LAD and LCx??with the placement of a Synergy DES x2??with excellent angiographic result and TIMI 3 flow.  4. Rescue balloon angioplasty of the DIAG2 vessel that was occluded after dilation of the LAD stent    10/22/18  1.   Significant 90% stenosis of distal RCA, severe proximal LCx stenosis, and diffuse severe distal LAD stenosis  2.   Elevated LVEDP = 22   3.   Successful DES to the distal RCA with the placement of a Synergy 4.0 x 20 with excellent angiographic result and TIMI 3 flow.    05/12/18  5. Coronary artery disease including diffuse moderate to severe lesions in the left coronary system, with particular emphasis on the 90% proximal to mid LAD in-stent restenosis.  6. Successful PCI to the proximal to mid LAD with the placement of a Synergy drug eluting stent with excellent angiographic result and TIMI 3 flow.  7. Subsequent impaired flow at an adjacent moderate diagonal, which was small and had pre-existing ostial disease. Recrossed and treated with balloon angioplasty with restored flow.  8. Recent stent to RPDA/RPLA bifurcation widely patent.  9. Normal left ventricular filling pressures (LVEDP = 8 mm Hg).    03/12/18  1. Coronary artery disease including??severe mid RCA, and distal RCA bifurcation stenosis.  2. Left heart cath deferred  3. Successful??PCI??to the mid RCA and RPLA/RPDA bifurcation??with the placement of a Synergy DES x??with excellent angiographic result and TIMI 3 flow.    Echo 02/24/20    1. Technically difficult study.    2. Ultrasound enhancing agent utilized to improve endocardial border definition.    3. The left ventricle is mildly dilated in size with normal wall thickness.    4. The left ventricular systolic function is severely decreased,  LVEF is visually estimated at 20-25%.    5. The apical anterior wall, apical cap, and mid anterior wall are hypokinetic.    6. There is grade III diastolic dysfunction (severely elevated filling pressure).    7. The left atrium is mildly to moderately dilated in size.    8. The right ventricle is normal in size, with severely reduced systolic function.    9. The right atrium is mildly dilated  in size.    PVL Vessel Mapping Hemodialysis 11/21/19  Right: Normal Doppler waveforms detected in the brachial and ulnar arteries. No plaque in the brachial artery. Radial artery occluded and fills distally via a collateral. See chart above for vein diameters.  Left: Normal Doppler waveforms detected in the brachial, radial and ulnar arteries. No plaque in the brachial artery. No plaque in the radial artery. See chart above for vein diameters.    ECG 07/28/19  SINUS RHYTHM WITH 1ST DEGREE AV BLOCK  T WAVE ABNORMALITY, CONSIDER LATERAL ISCHEMIA    Current Outpatient Medications   Medication Sig Dispense Refill   ??? acetaminophen (TYLENOL) 500 MG tablet TAKE 2 TABLETS (1000MG ) BY MOUTH EVERY 8 HOURS AS NEEDED FOR PAIN 180 tablet 0   ??? albuterol HFA 90 mcg/actuation inhaler Inhale 2 puffs into the lungs every 6 (six) hours as needed for wheezing or shortness of breath. 18 g 2   ??? alirocumab (PRALUENT) 150 mg/mL subcutaneous injection Inject the contents of 1 pen (150 mg total) under the skin every fourteen (14) days. 6 mL 3   ??? allopurinoL (ZYLOPRIM) 100 MG tablet Take 2 tablets (200 mg total) by mouth daily. 180 tablet 0   ??? amiodarone (PACERONE) 200 MG tablet TAKE 1 TABLET BY MOUTH ONCE DAILY 90 tablet 3   ??? apixaban (ELIQUIS) 5 mg Tab Take 1 tablet (5 mg total) by mouth Two (2) times a day. 60 tablet 11   ??? atorvastatin (LIPITOR) 80 MG tablet Take 1 tablet (80 mg total) by mouth daily. 90 tablet 3   ??? blood sugar diagnostic Strp Use as directed to check blood glucose Four (4) times a day (before meals and nightly). 300 each 3   ??? blood-glucose meter kit Disp. blood glucose meter kit preferred by patient's insurance. Dx: Diabetes, E11.9 1 each 11   ??? bumetanide (BUMEX) 2 MG tablet Take 1 tablet (2 mg total) by mouth two (2) times a day. On non dialysis days 180 tablet 3   ??? cholecalciferol, vitamin D3 25 mcg, 1,000 units,, 1,000 unit (25 mcg) tablet Take 1 tablet (25 mcg total) by mouth daily. 100 tablet 11   ??? diclofenac sodium (VOLTAREN) 1 % gel Apply 2 grams topically Three (3) times a day. 180 g 11   ??? empagliflozin (JARDIANCE) 10 mg tablet Take 1 tablet (10 mg total) by mouth every morning. 30 tablet 3   ??? empty container (SHARPS-A-GATOR DISPOSAL SYSTEM) Misc Use as directed for sharps disposal 1 each 2   ??? ezetimibe (ZETIA) 10 mg tablet TAKE 1 TABLET BY MOUTH ONCE DAILY 90 each 3   ??? FLUoxetine (PROZAC) 40 MG capsule Take 2 capsules (80 mg total) by mouth daily. 180 capsule 0   ??? gabapentin (NEURONTIN) 300 MG capsule Take 1 capsule (300 mg total) by mouth two (2) times a day. 180 capsule 2   ??? insulin ASPART (NOVOLOG FLEXPEN U-100 INSULIN) 100 unit/mL (3 mL) injection pen Inject 15 Units under the skin Three (3) times a day before meals. 45 mL  3   ??? insulin detemir U-100 (LEVEMIR FLEXTOUCH U-100 INSULN) 100 unit/mL (3 mL) injection pen Inject 30 units under the skin nightly. 15 mL 3   ??? insulin syringe-needle U-100 0.3 mL 31 gauge x 5/16 Syrg Use to inject insulin twice daily with meals 60 each 0   ??? isosorbide mononitrate (IMDUR) 60 MG 24 hr tablet Take 1 tablet (60 mg total) by mouth daily. DO NOT TAKE on dialysis days 90 tablet 3   ??? lancets Misc Use as directed to check blood glucose Four (4) times a day (before meals and nightly). 300 each 3   ??? lancets Misc Disp. lancets #200 or amount allowed, Testing QID, Dx: E11.65, Z79.4. (DM type 2 , Insulin Dep) 200 each 11   ??? loperamide (IMODIUM) 2 mg capsule Take 1 capsule twice a day and another capsule after each episode of diarrhea, up to 8 pills per day. 240 capsule 2   ??? metoprolol succinate (TOPROL XL) 100 MG 24 hr tablet Take 1 tablet (100 mg total) by mouth daily. 30 tablet 1   ??? mirtazapine (REMERON) 7.5 MG tablet Take 1 tablet (7.5 mg total) by mouth nightly. 30 tablet 0   ??? miscellaneous medical supply (BLOOD PRESSURE CUFF) Misc Measure BP once a day or if any symptoms 1 each 0   ??? omeprazole (PRILOSEC) 40 MG capsule Take 1 capsule (40 mg total) by mouth Two (2) times a day (30 minutes before a meal). 180 capsule 3   ??? ondansetron (ZOFRAN) 4 MG tablet Take 1 tablet (4 mg total) by mouth every twelve (12) hours as needed. 15 tablet 0   ??? semaglutide (OZEMPIC) 0.25 mg or 0.5 mg(2 mg/1.5 mL) PnIj injection Inject 0.5 mg under the skin every seven (7) days. 4.5 mL 3   ??? sevelamer (RENVELA) 800 mg tablet TAKE 2 TABLETS (1600MG ) BY MOUTH THREE TIMES DAILY WITH MEALS 540 tablet 3   ??? ticagrelor (BRILINTA) 90 mg Tab TAKE 1 TABLET BY MOUTH TWICE DAILY 180 tablet 0   ??? UNIFINE PENTIPS 31 gauge x 5/16 (8 mm) Ndle USE WITH INSULIN AND VICTOZA 5 TIMES DAILY 400 each 3   ??? nitroglycerin (NITROLINGUAL) 0.4 mg/dose spray Place 1 spray under the tongue every five (5) minutes as needed for chest pain. pT MUST HAVE SPRAY DUE TO DRY MOUTH AND PILLS DO NOT DISSOLVE (Patient not taking: Reported on 03/08/2020) 12 g 12   ??? ranolazine (RANEXA) 500 MG 12 hr tablet Take 1 tablet (500 mg total) by mouth Two (2) times a day. 60 tablet 6     No current facility-administered medications for this visit.       Allergies   Allergen Reactions   ??? Losartan      Hyperkalemia (K>6)   ??? Morphine Palpitations and Anxiety     Medication error: was administered 20mL instead of 2mL       Review of Systems  Pertinent items are noted in HPI.      Objective:      Physical Exam:  VITAL SIGNS: BP 167/91  - Pulse 87  - Ht 188 cm (6' 2)  - Wt 72.6 kg (160 lb) Comment: DRY WEIGHT - SpO2 97%  - BMI 20.54 kg/m??   GENERAL: NAD  HEENT: Normocephalic and atraumatic.Conjunctivae and sclerae clear and anicteric. No xanthelasma.   NECK: Supple, without masses, thyroid enlargement or adenopathy.  CARDIOVASCULAR: RRR without m/r/g.  RESPIRATORY: Normal respiratory effort without use of accessory muscles. Clear to auscultation  bilaterally.  ABDOMEN: Soft, not tender or distended, with audible bowel sounds. No palpable organ enlargement or abnormal masses.  EXTREMITIES:  No pretibial or ankle edema. Ambulatory ability satisfactory.  SKIN: No rashes, ecchymosis or petechiae.  NEUROLOGIC: Appropriate mood and affect. Alert and oriented to person, place, and time. No gross motor or sensory deficits evident.    Wt Readings from Last 6 Encounters:   03/08/20 72.6 kg (160 lb)   03/07/20 72.6 kg (160 lb)   02/09/20 (!) 160.5 kg (353 lb 12.8 oz)   01/28/20 (!) 169.8 kg (374 lb 5.5 oz)   12/15/19 (!) 161 kg (355 lb)   11/21/19 (!) 161.6 kg (356 lb 4.2 oz)       Please excuse typos, dictation completed via Conservation officer, historic buildings      _____________________________________________________________  [employer] Attestation:         This document serves as a record of the services and decisions performed by Elpidio Anis, MD on 03/08/2020. It was created on his behalf by Inez Pilgrim, a trained medical scribe. The creation of this document is based on the provider's statements and observations that were conveyed to the medical scribe during the patient's encounter.     (The information in this document, created by the medical scribe for me, accurately reflects the services I personally performed and the decisions made by me. I have reviewed and approved this document for accuracy.)     Elpidio Anis  MD [facility]  Division of Cardiology   [place] [facility] [phone]  Pager [phone]

## 2020-03-16 DIAGNOSIS — F3342 Major depressive disorder, recurrent, in full remission: Principal | ICD-10-CM

## 2020-03-16 DIAGNOSIS — M109 Gout, unspecified: Principal | ICD-10-CM

## 2020-03-16 MED ORDER — FLUOXETINE 40 MG CAPSULE
ORAL_CAPSULE | Freq: Every day | ORAL | 0 refills | 90 days
Start: 2020-03-16 — End: 2021-03-16

## 2020-03-16 MED ORDER — ALLOPURINOL 100 MG TABLET
ORAL_TABLET | Freq: Every day | ORAL | 0 refills | 90 days
Start: 2020-03-16 — End: 2021-03-16
  Filled 2020-04-03: qty 180, 90d supply, fill #0

## 2020-03-16 MED ORDER — ACETAMINOPHEN 500 MG TABLET
ORAL_TABLET | ORAL | 0 refills | 0 days
Start: 2020-03-16 — End: 2021-03-16

## 2020-03-16 MED ORDER — MIRTAZAPINE 7.5 MG TABLET
ORAL_TABLET | Freq: Every evening | ORAL | 0 refills | 30.00000 days
Start: 2020-03-16 — End: 2020-04-15

## 2020-03-16 MED ORDER — BRILINTA 90 MG TABLET
ORAL_TABLET | ORAL | 0 refills | 0 days
Start: 2020-03-16 — End: 2021-03-16

## 2020-03-17 MED ORDER — ALBUTEROL SULFATE HFA 90 MCG/ACTUATION AEROSOL INHALER
RESPIRATORY_TRACT | 2 refills | 0.00000 days | Status: SS
Start: 2020-03-17 — End: ?
  Filled 2020-04-03: qty 18, 25d supply, fill #0

## 2020-03-17 MED ORDER — ACETAMINOPHEN 500 MG TABLET
ORAL_TABLET | ORAL | 0 refills | 0.00000 days
Start: 2020-03-17 — End: 2021-03-17

## 2020-03-17 MED ORDER — MIRTAZAPINE 7.5 MG TABLET
ORAL_TABLET | Freq: Every evening | ORAL | 0 refills | 30.00000 days | Status: CP
Start: 2020-03-17 — End: 2020-04-25
  Filled 2020-04-03: qty 30, 30d supply, fill #0

## 2020-03-17 NOTE — Unmapped (Signed)
Refill request for Mirtazapine

## 2020-03-17 NOTE — Unmapped (Signed)
Refill request for Brillinta, Allopurinol, Fluoxetine and Albuterol

## 2020-03-20 ENCOUNTER — Telehealth: Admit: 2020-03-20 | Discharge: 2020-03-21 | Payer: MEDICARE | Attending: Clinical | Primary: Clinical

## 2020-03-20 DIAGNOSIS — F418 Other specified anxiety disorders: Principal | ICD-10-CM

## 2020-03-20 DIAGNOSIS — F5104 Psychophysiologic insomnia: Principal | ICD-10-CM

## 2020-03-20 MED ORDER — SEVELAMER CARBONATE 800 MG TABLET
ORAL_TABLET | Freq: Three times a day (TID) | ORAL | 11 refills | 30.00000 days | Status: SS
Start: 2020-03-20 — End: 2021-03-20

## 2020-03-20 MED ORDER — SEVELAMER HCL 800 MG TABLET
ORAL_TABLET | 3 refills | 0.00000 days | Status: SS
Start: 2020-03-20 — End: 2020-03-28

## 2020-03-20 NOTE — Unmapped (Signed)
INTERNAL MEDICINE COUNSELING SESSION NOTE    LENGTH OF SESSION:  Psychotherapy Session 45 minutes    TYPE OF PSYCHOTHERAPY: Behavioral, Supportive    REASON FOR TREATMENT: Problem Solving Treatment and Mind Body Stress Reduction skill training for adjustment reaction with depression and anxiety, stress and self-care management; link impact of effort to mood.             I spent 45 minutes on the real-time audio and video with the patient on the date of service. I spent an additional 5 minutes on pre- and post-visit activities on the date of service.     The patient was physically located in [place] or a state in which I am permitted to provide care. The patient and/or parent/guardian understood that s/he Madry incur co-pays and cost sharing, and agreed to the telemedicine visit. The visit was reasonable and appropriate under the circumstances given the patient's presentation at the time.    The patient and/or parent/guardian has been advised of the potential risks and limitations of this mode of treatment (including, but not limited to, the absence of in-person examination) and has agreed to be treated using telemedicine. The patient's/patient's family's questions regarding telemedicine have been answered.     If the visit was completed in an ambulatory setting, the patient and/or parent/guardian has also been advised to contact their provider???s office for worsening conditions, and seek emergency medical treatment and/or call 911 if the patient deems either necessary.             REVIEW OF FUNCTIONING SINCE LAST VISIT    MOOD (0-10) =  I'm trying to do the best I can.    7  of 10 with 10 being the best. At last visit was   10    SATISFACTION WITH EFFORT IN CARING FOR MOOD (0-10) =    3 of 10 with 10 being the best.  At last visit was   10    PAIN:     0   At last visit was    5    PHQ-9 SCORE:     19  At last visit was  6    GAD-7 SCORE:     20  At last visit was    6      CURRENT SUICIDAL/HOMICIDAL IDEATION: None      PROBLEM LIST:    Patient Active Problem List   Diagnosis   ??? 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 Idiopathic chronic gout of left knee   ??? Hypercholesteremia   ??? Diabetes mellitus with coincident hypertension (CMS-[name])   ??? 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 Osteoarthritis   ??? Chronic nonalcoholic liver disease   ??? GERD (gastroesophageal reflux disease)   ??? Mild intermittent asthma without complication   ??? Chronic pain syndrome   ??? Paroxysmal atrial fibrillation (CMS-[name])   ??? OSA (obstructive sleep apnea)   ??? Tobacco use disorder   ??? Status post cardiac pacemaker procedure   ??? Diabetes mellitus with gastroparesis (CMS-[name])   ??? Cardiac pacemaker in situ   ??? Recurrent major depressive disorder, in full remission (CMS-[name])   ??? NSTEMI (non-ST elevation myocardial infarction) (CMS-[name])   ??? Mixed anxiety and depressive disorder   ??? HFrEF (heart failure with reduced ejection fraction) (CMS-[name])   ??? Hyperkalemia   ??? Acquired left foot  drop   ??? Decreased sensation of lower extremity, left   ??? Chronic anticoagulation   ??? Type 2 diabetes mellitus with stage 4 chronic kidney disease, with long-term current use of insulin (CMS-[name])   ??? Situational stress   ??? Cough   ??? QT prolongation   ??? ESRD needing dialysis (CMS-[name])   ??? Palliative care by specialist   ??? Angina pectoris (CMS-[name])   ??? Psychophysiological insomnia         MEDICATION COMPLIANCE:     Taking all medications regularly?  yes     Any changes in medication since last visit?  yes - multiple changes in medicine with hospitalizations.     Any missed doses? no       Current Outpatient Medications on File Prior to Visit   Medication Sig Dispense Refill   ??? acetaminophen (TYLENOL) 500 MG tablet TAKE 2 TABLETS (1000MG ) BY MOUTH EVERY 8 HOURS AS NEEDED FOR PAIN 180 tablet 0   ??? albuterol HFA 90 mcg/actuation inhaler Inhale 2 puffs into the lungs every 6 (six) hours as needed for wheezing or shortness of breath. 18 g 2   ??? alirocumab (PRALUENT) 150 mg/mL subcutaneous injection Inject the contents of 1 pen (150 mg total) under the skin every fourteen (14) days. 6 mL 3   ??? allopurinoL (ZYLOPRIM) 100 MG tablet Take 2 tablets (200 mg total) by mouth daily. 180 tablet 0   ??? amiodarone (PACERONE) 200 MG tablet TAKE 1 TABLET BY MOUTH ONCE DAILY 90 tablet 3   ??? apixaban (ELIQUIS) 5 mg Tab Take 1 tablet (5 mg total) by mouth Two (2) times a day. 60 tablet 11   ??? atorvastatin (LIPITOR) 80 MG tablet Take 1 tablet (80 mg total) by mouth daily. 90 tablet 3   ??? blood sugar diagnostic Strp Use as directed to check blood glucose Four (4) times a day (before meals and nightly). 300 each 3   ??? blood-glucose meter kit Disp. blood glucose meter kit preferred by patient's insurance. Dx: Diabetes, E11.9 1 each 11   ??? bumetanide (BUMEX) 2 MG tablet Take 1 tablet (2 mg total) by mouth two (2) times a day. On non dialysis days 180 tablet 3   ??? cholecalciferol, vitamin D3 25 mcg, 1,000 units,, 1,000 unit (25 mcg) tablet Take 1 tablet (25 mcg total) by mouth daily. 100 tablet 11   ??? diclofenac sodium (VOLTAREN) 1 % gel Apply 2 grams topically Three (3) times a day. 180 g 11   ??? empagliflozin (JARDIANCE) 10 mg tablet Take 1 tablet (10 mg total) by mouth every morning. 30 tablet 3   ??? empty container (SHARPS-A-GATOR DISPOSAL SYSTEM) Misc Use as directed for sharps disposal 1 each 2   ??? ezetimibe (ZETIA) 10 mg tablet TAKE 1 TABLET BY MOUTH ONCE DAILY 90 each 3   ??? FLUoxetine (PROZAC) 40 MG capsule Take 2 capsules (80 mg total) by mouth daily. 180 capsule 0   ??? gabapentin (NEURONTIN) 300 MG capsule Take 1 capsule (300 mg total) by mouth two (2) times a day. 180 capsule 2   ??? insulin ASPART (NOVOLOG FLEXPEN U-100 INSULIN) 100 unit/mL (3 mL) injection pen Inject 15 Units under the skin Three (3) times a day before meals. 45 mL 3   ??? insulin detemir U-100 (LEVEMIR FLEXTOUCH U-100 INSULN) 100 unit/mL (3 mL) injection pen Inject 30 units under the skin nightly. 15 mL 3   ??? insulin syringe-needle U-100 0.3 mL 31 gauge x 5/16 Syrg Use  to inject insulin twice daily with meals 60 each 0   ??? isosorbide mononitrate (IMDUR) 60 MG 24 hr tablet Take 1 tablet (60 mg total) by mouth daily. DO NOT TAKE on dialysis days 90 tablet 3   ??? lancets Misc Use as directed to check blood glucose Four (4) times a day (before meals and nightly). 300 each 3   ??? lancets Misc Disp. lancets #200 or amount allowed, Testing QID, Dx: E11.65, Z79.4. (DM type 2 , Insulin Dep) 200 each 11   ??? loperamide (IMODIUM) 2 mg capsule Take 1 capsule twice a day and another capsule after each episode of diarrhea, up to 8 pills per day. 240 capsule 2   ??? metoprolol succinate (TOPROL XL) 100 MG 24 hr tablet Take 1 tablet (100 mg total) by mouth daily. 30 tablet 1   ??? mirtazapine (REMERON) 7.5 MG tablet Take 1 tablet (7.5 mg total) by mouth nightly. 30 tablet 0   ??? miscellaneous medical supply (BLOOD PRESSURE CUFF) Misc Measure BP once a day or if any symptoms 1 each 0   ??? nitroglycerin (NITROLINGUAL) 0.4 mg/dose spray Place 1 spray under the tongue every five (5) minutes as needed for chest pain. pT MUST HAVE SPRAY DUE TO DRY MOUTH AND PILLS DO NOT DISSOLVE (Patient not taking: Reported on 03/08/2020) 12 g 12   ??? omeprazole (PRILOSEC) 40 MG capsule Take 1 capsule (40 mg total) by mouth Two (2) times a day (30 minutes before a meal). 180 capsule 3   ??? ondansetron (ZOFRAN) 4 MG tablet Take 1 tablet (4 mg total) by mouth every twelve (12) hours as needed. 15 tablet 0   ??? ranolazine (RANEXA) 500 MG 12 hr tablet Take 1 tablet (500 mg total) by mouth Two (2) times a day. 60 tablet 6   ??? semaglutide (OZEMPIC) 0.25 mg or 0.5 mg(2 mg/1.5 mL) PnIj injection Inject 0.5 mg under the skin every seven (7) days. 4.5 mL 3   ??? sevelamer (RENVELA) 800 mg tablet TAKE 2 TABLETS (1600MG ) BY MOUTH THREE TIMES DAILY WITH MEALS 540 tablet 3   ??? ticagrelor (BRILINTA) 90 mg Tab TAKE 1 TABLET BY MOUTH TWICE DAILY 180 tablet 0   ??? UNIFINE PENTIPS 31 gauge x 5/16 (8 mm) Ndle USE WITH INSULIN AND VICTOZA 5 TIMES DAILY 400 each 3   ??? [DISCONTINUED] liraglutide (VICTOZA 2-PAK) 0.6 mg/0.1 mL (18 mg/3 mL) injection Inject 0.1 mL (0.6 mg total) under the skin daily. 3 mL 1   ??? [DISCONTINUED] traZODone (DESYREL) 50 MG tablet Take 1 tablet (50 mg total) by mouth nightly. 90 tablet 3     No current facility-administered medications on file prior to visit.           SLEEPING:    Not good. Getting maybe 2 hrs of sleep since last heart attack 6 weeks ago  Will sleep on days after coming home from dialysis. Goes for dialysis 3 days a week, 4 hours at a time.          APPETITE:    Decreased interest. Texture of foods feel off since heart attack feels like silk. He's trying to eat something 3x a day.          SUBSTANCE USE:  no         SESSION FOCUS:        Today's Problem Solving addressed:    Reviewed patient's efforts and linked impact of effort to   1. Mixed anxiety and depressive disorder  2. Psychophysiological insomnia        Addressed:    Insomnia - explored and identified fears about sleep, anticipatory grief about end of life and time with family impacting ability to initiate sleep and remain asleep. Clinician raised option to discuss treatment for insomnia with Dr. Hulan Fess. He [place] consider in the future.    Mixed anxiety and depression  Impact of multiple health issues and hospitalizations and family issues.  He's had 19 heart attacks.  Doctor gave him a year to live. Trying to deal with this news. He's discussed this with his wife and his daughters.  Learned son that was adopted died in a fire at age 38. Now in weekly contact with adopted daughter.  Nephew has had 2 heart attacks in this last year.  Deaths of a couple of family members to COVID in the last year.  Wife with increasing health issues.  Still on dialysis  Went hunting two times this year.  Can only walk about 50 feet on his own. He needs to have wife push him in a wheelchair and he worries about putting more on her.  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  Multiple family members with health and emotional issues.  Explored and discussed fears and hopes for this time period.    He says his goals are to:  Take care of himself  Try to help his wife as much as he can  Spend as much time with family as possible even by phone and video  He has already addressed:  Current housing rent is paid for 1 yr  Made funeral arrangements      Discussed treatment needs and focus, set goal.  He expressed appreciation for counseling.  He will have his wife call to schedule an appointment in two weeks.      Discussed issues presented and options for continued and/or improved self care management   -time with family  -1 week visit with grandson, Pricilla Larsson, planned January 1st          Additional:     Medication adherence and barriers to the treatment plan have been addressed. Opportunities to optimize healthy behaviors have been discussed. Patient / caregiver voiced understanding.    Oldest daughter Gennette Pac, married to [place] who is an ex-marine.  She works in Firefighter.        ASSESSMENT    Patient with current mixed depression and anxiety and physiological insomnia. Rule out Major Depression.    ENGAGEMENT: Patient presented a willingness to participate in treatment.  PARTICIPATION QUALITY:    Active  MOOD:  I'm trying to do the best I can.     AFFECT:  REACTIVE and TEARFUL AT TIMES   MENTAL STATUS:     alert and oriented  MODES OF INTERVENTION used in this session:    Clarification, Exploration, Problem Solving, Support, Reframing or Week review      TREATMENT PLAN    Identified treatment goals:????Get or keep PHQ / GAD under 10 points, stress??and??self-care management   ??  Patient-stated health goal:??get some of this stuff off my head and dealing with impact of most recent heart attack    Problem Solving Treatment and Mind Body Stress Reduction Skill Training:  Next visit will be   2 of up to 12 visits.    RCO in 2 weeks.    Patient has been informed they should call clinic to schedule a return counseling appointment.

## 2020-03-23 DIAGNOSIS — Z5900 Homelessness unspecified: Principal | ICD-10-CM

## 2020-03-23 DIAGNOSIS — E1340 Other specified diabetes mellitus with diabetic neuropathy, unspecified: Principal | ICD-10-CM

## 2020-03-23 DIAGNOSIS — Z794 Long term (current) use of insulin: Principal | ICD-10-CM

## 2020-03-23 DIAGNOSIS — Z7901 Long term (current) use of anticoagulants: Principal | ICD-10-CM

## 2020-03-23 DIAGNOSIS — I5042 Chronic combined systolic (congestive) and diastolic (congestive) heart failure: Principal | ICD-10-CM

## 2020-03-23 DIAGNOSIS — G8929 Other chronic pain: Principal | ICD-10-CM

## 2020-03-23 DIAGNOSIS — K219 Gastro-esophageal reflux disease without esophagitis: Principal | ICD-10-CM

## 2020-03-23 DIAGNOSIS — N186 End stage renal disease: Principal | ICD-10-CM

## 2020-03-23 DIAGNOSIS — I25118 Atherosclerotic heart disease of native coronary artery with other forms of angina pectoris: Principal | ICD-10-CM

## 2020-03-23 DIAGNOSIS — E785 Hyperlipidemia, unspecified: Principal | ICD-10-CM

## 2020-03-23 DIAGNOSIS — I25110 Atherosclerotic heart disease of native coronary artery with unstable angina pectoris: Principal | ICD-10-CM

## 2020-03-23 DIAGNOSIS — Z955 Presence of coronary angioplasty implant and graft: Principal | ICD-10-CM

## 2020-03-23 DIAGNOSIS — Z8673 Personal history of transient ischemic attack (TIA), and cerebral infarction without residual deficits: Principal | ICD-10-CM

## 2020-03-23 DIAGNOSIS — Z6841 Body Mass Index (BMI) 40.0 and over, adult: Principal | ICD-10-CM

## 2020-03-23 DIAGNOSIS — I132 Hypertensive heart and chronic kidney disease with heart failure and with stage 5 chronic kidney disease, or end stage renal disease: Principal | ICD-10-CM

## 2020-03-23 DIAGNOSIS — D631 Anemia in chronic kidney disease: Principal | ICD-10-CM

## 2020-03-23 DIAGNOSIS — Z885 Allergy status to narcotic agent status: Principal | ICD-10-CM

## 2020-03-23 DIAGNOSIS — Z720 Tobacco use: Principal | ICD-10-CM

## 2020-03-23 DIAGNOSIS — Z20822 Contact with and (suspected) exposure to covid-19: Principal | ICD-10-CM

## 2020-03-23 DIAGNOSIS — F4322 Adjustment disorder with anxiety: Principal | ICD-10-CM

## 2020-03-23 DIAGNOSIS — Z7902 Long term (current) use of antithrombotics/antiplatelets: Principal | ICD-10-CM

## 2020-03-23 DIAGNOSIS — Z992 Dependence on renal dialysis: Principal | ICD-10-CM

## 2020-03-23 DIAGNOSIS — I480 Paroxysmal atrial fibrillation: Principal | ICD-10-CM

## 2020-03-23 DIAGNOSIS — Z791 Long term (current) use of non-steroidal anti-inflammatories (NSAID): Principal | ICD-10-CM

## 2020-03-23 DIAGNOSIS — I252 Old myocardial infarction: Principal | ICD-10-CM

## 2020-03-23 DIAGNOSIS — E1322 Other specified diabetes mellitus with diabetic chronic kidney disease: Principal | ICD-10-CM

## 2020-03-23 DIAGNOSIS — J45909 Unspecified asthma, uncomplicated: Principal | ICD-10-CM

## 2020-03-23 DIAGNOSIS — Z950 Presence of cardiac pacemaker: Principal | ICD-10-CM

## 2020-03-23 DIAGNOSIS — Z79899 Other long term (current) drug therapy: Principal | ICD-10-CM

## 2020-03-23 DIAGNOSIS — Z550 Illiteracy and low-level literacy: Principal | ICD-10-CM

## 2020-03-23 DIAGNOSIS — G4733 Obstructive sleep apnea (adult) (pediatric): Principal | ICD-10-CM

## 2020-03-26 NOTE — Unmapped (Signed)
text patient to call & schedule follow up with Carmelia Bake (04/04/19).    Check out comments: RCO in 2 weeks.     Saddie Benders  03/26/20

## 2020-03-27 ENCOUNTER — Ambulatory Visit: Admit: 2020-03-27 | Discharge: 2020-03-28 | Payer: MEDICARE

## 2020-03-27 LAB — CBC
HEMATOCRIT: 35.8 % — ABNORMAL LOW (ref 41.0–53.0)
HEMOGLOBIN: 11.4 g/dL — ABNORMAL LOW (ref 13.5–17.5)
MEAN CORPUSCULAR HEMOGLOBIN CONC: 32 g/dL (ref 31.0–37.0)
MEAN CORPUSCULAR HEMOGLOBIN: 28.5 pg (ref 26.0–34.0)
MEAN CORPUSCULAR VOLUME: 89.2 fL (ref 80.0–100.0)
MEAN PLATELET VOLUME: 8.8 fL (ref 7.0–10.0)
PLATELET COUNT: 243 10*9/L (ref 150–440)
RED BLOOD CELL COUNT: 4.01 10*12/L — ABNORMAL LOW (ref 4.50–5.90)
RED CELL DISTRIBUTION WIDTH: 16.3 % — ABNORMAL HIGH (ref 12.0–15.0)
WBC ADJUSTED: 9.9 10*9/L (ref 4.5–11.0)

## 2020-03-27 LAB — BASIC METABOLIC PANEL
ANION GAP: 8 mmol/L (ref 5–14)
BLOOD UREA NITROGEN: 36 mg/dL — ABNORMAL HIGH (ref 9–23)
BUN / CREAT RATIO: 7
CALCIUM: 8.2 mg/dL — ABNORMAL LOW (ref 8.7–10.4)
CHLORIDE: 100 mmol/L (ref 98–107)
CO2: 24 mmol/L (ref 20.0–31.0)
CREATININE: 5.24 mg/dL — ABNORMAL HIGH
EGFR CKD-EPI AA MALE: 14 mL/min/{1.73_m2} — ABNORMAL LOW (ref >=60)
EGFR CKD-EPI NON-AA MALE: 12 mL/min/{1.73_m2} — ABNORMAL LOW (ref >=60)
GLUCOSE RANDOM: 254 mg/dL — ABNORMAL HIGH (ref 70–99)
SODIUM: 132 mmol/L — ABNORMAL LOW (ref 135–145)

## 2020-03-27 LAB — POTASSIUM: POTASSIUM: 4.8 mmol/L — ABNORMAL HIGH (ref 3.4–4.5)

## 2020-03-27 MED ADMIN — sodium chloride (NS) 0.9 % infusion: 50 mL/h | INTRAVENOUS | @ 13:00:00 | Stop: 2020-03-27

## 2020-03-27 MED ADMIN — fentaNYL (PF) (SUBLIMAZE) injection: INTRAVENOUS | @ 13:00:00 | Stop: 2020-03-27

## 2020-03-27 MED ADMIN — heparin (porcine) 1000 unit/mL injection: INTRAVENOUS | @ 14:00:00 | Stop: 2020-03-27

## 2020-03-27 MED ADMIN — lidocaine (PF) (XYLOCAINE-MPF) 20 mg/mL (2 %) injection: SUBCUTANEOUS | @ 13:00:00 | Stop: 2020-03-27

## 2020-03-27 MED ADMIN — aspirin chewable tablet 324 mg: 324 mg | ORAL | @ 13:00:00 | Stop: 2020-03-27

## 2020-03-27 MED ADMIN — fentaNYL (PF) (SUBLIMAZE) injection: INTRAVENOUS | @ 14:00:00 | Stop: 2020-03-27

## 2020-03-27 MED ADMIN — amiodarone (CORDARONE) injection: INTRAVENOUS | @ 14:00:00 | Stop: 2020-03-27

## 2020-03-27 MED ADMIN — iohexoL (OMNIPAQUE) 300 mg iodine/mL solution: INTRACORONARY | @ 14:00:00 | Stop: 2020-03-27

## 2020-03-27 MED ADMIN — heparin (porcine) in NS 10,000 unit/1,000 mL Manifold Flush: @ 14:00:00 | Stop: 2020-03-27

## 2020-03-27 MED ADMIN — ticagrelor (BRILINTA) tablet: ORAL | @ 15:00:00 | Stop: 2020-03-27

## 2020-03-27 MED ADMIN — midazolam (VERSED) injection: INTRAVENOUS | @ 13:00:00 | Stop: 2020-03-27

## 2020-03-27 MED ADMIN — midazolam (VERSED) injection: INTRAVENOUS | @ 14:00:00 | Stop: 2020-03-27

## 2020-03-27 NOTE — Unmapped (Signed)
Brief Operative Note  (CSN: [id number])      Date of Surgery: 03/27/2020    Pre-op Diagnosis: CAD    Post-op Diagnosis: Coronary artery disease of native artery of native heart with stable angina pectoris (CMS-[name]) [I25.118]    Procedure(s):  CATH LEFT HEART CATHETERIZATION W INTERVENTION: 93458 (CPT??)  Note: Revisions to procedures should be made in chart - see Procedures activity.    Performing Service: Cardiology  Surgeon(s) and Role:     * Marlaine Hind, MD - Primary     * Damien Fusi, MD - Fellow - Diagnostic     * Luretha Rued, MD - Fellow - Interventional    Assistant: None    Findings:     R femoral access with long sheath due to obesity     Severe 2v CAD including 95% LAD/DIAG stenosis, 80% distal RCA  LVEDP 34 mm Hg    PCI with balloon-only angioplasty to LAD and RCA, Xience stent x 1 to DIAG     Admit for acute dialysis for volume removal    Anesthesia: Conscious Sedation (Nurse Admin)    Estimated Blood Loss: 50 ml    Complications: None    Specimens: None collected    Implants:   Implant Name Type Inv. Item Serial No. Manufacturer Lot No. LRB No. Used Action   STENT XIENCE [id number] SKYPOINT DES RAPIDEXCH - [id number] Stent STENT XIENCE [id number] SKYPOINT DES [facility] [id number] [employer] (GUIDANT) [id number] N/A 1 Implanted       Surgeon Notes: I was present and scrubbed for the entire procedure    Elpidio Anis   Date: 03/27/2020  Time: 11:03 AM

## 2020-03-27 NOTE — Unmapped (Signed)
Pt brought back to [place] #8 post PCI. 6Fr. Long sheath RFA sutured intact to right groin.

## 2020-03-27 NOTE — Unmapped (Signed)
ICD Interrogation  RE: therapy delivered, Pt reports ICD discharge on Saturday, 12/25    Manufacturer: [employer]: VIGILANT EL ICD D233/ 161096  Type of Device: Dual Chamber Defibrillator  Implanting MD: Aggie Hacker   Indication for implant: SSS/Brady Tachy Paroxysmal Atrial Fib Monomorphic VT NYHA Class III LV Ejection Fraction 45 % Cardiac Disease Coronary Artery Disease Ischemic Cardiomyopathy  DOI: Original/Leads: 05/2015 Generator/Upgrade: 05/04/2017    Battery: 11.5 years estimated longevity     Mode: DDD  RYTHMIQ   Lower rate limit 70 bpm UTR: 130   Tachy Settings:      Presenting Rhythm: AP/VS   Underlying rhythm: SR 1*HB 70 bpm   Dependent:  No    Pacing Percentages  AP: 25  RVP: <1    Impedence (ohms)  Atrial lead 610  Right Ventricular lead 298   HV/SVC 46     Sensing (mV)  (P)Atrial lead 2.8  (R)Ventricular lead 14.7     Capture threshold (V @ ms)  Atrial lead 1.1V @ 0.77ms  Right Ventricular lead 0.9V @ 0.38ms     Diagnostics/Episodes last remote report on 11/02  Date in question, 12/25: 5- VT-1 episodes, EGM #1486 treated with ATPx4 and 41J x 1, appears AF/AT with RVR at 190 bpm  9- NSVT on 12/25, EGMs appear AF/AT with VT/RVR   Prior to 12/25, 14- VT-1 episodes, last on 12/10, therapy ATPx1 on 12/04 EGM #1419 appears AF/AT with VT vs RVR  10- ATR last today at 1023am   AF/AT burden 6%    Fluid Monitoring  No Evidence of Fluid Accumulation   HeartLogic 15     Reprogramming  None    See PDF in media tab for full interrogation record    Questions, please call the Ep Device Rn via Vocera or dial ext [phone]  Ep Device clinic [phone]

## 2020-03-28 DIAGNOSIS — Z720 Tobacco use: Principal | ICD-10-CM

## 2020-03-28 DIAGNOSIS — G8929 Other chronic pain: Principal | ICD-10-CM

## 2020-03-28 DIAGNOSIS — Z20822 Contact with and (suspected) exposure to covid-19: Principal | ICD-10-CM

## 2020-03-28 DIAGNOSIS — I252 Old myocardial infarction: Principal | ICD-10-CM

## 2020-03-28 DIAGNOSIS — Z79899 Other long term (current) drug therapy: Principal | ICD-10-CM

## 2020-03-28 DIAGNOSIS — J45909 Unspecified asthma, uncomplicated: Principal | ICD-10-CM

## 2020-03-28 DIAGNOSIS — D631 Anemia in chronic kidney disease: Principal | ICD-10-CM

## 2020-03-28 DIAGNOSIS — I480 Paroxysmal atrial fibrillation: Principal | ICD-10-CM

## 2020-03-28 DIAGNOSIS — Z791 Long term (current) use of non-steroidal anti-inflammatories (NSAID): Principal | ICD-10-CM

## 2020-03-28 DIAGNOSIS — Z885 Allergy status to narcotic agent status: Principal | ICD-10-CM

## 2020-03-28 DIAGNOSIS — Z6841 Body Mass Index (BMI) 40.0 and over, adult: Principal | ICD-10-CM

## 2020-03-28 DIAGNOSIS — Z950 Presence of cardiac pacemaker: Principal | ICD-10-CM

## 2020-03-28 DIAGNOSIS — Z5900 Homelessness unspecified: Principal | ICD-10-CM

## 2020-03-28 DIAGNOSIS — Z7901 Long term (current) use of anticoagulants: Principal | ICD-10-CM

## 2020-03-28 DIAGNOSIS — Z992 Dependence on renal dialysis: Principal | ICD-10-CM

## 2020-03-28 DIAGNOSIS — E1322 Other specified diabetes mellitus with diabetic chronic kidney disease: Principal | ICD-10-CM

## 2020-03-28 DIAGNOSIS — I25110 Atherosclerotic heart disease of native coronary artery with unstable angina pectoris: Principal | ICD-10-CM

## 2020-03-28 DIAGNOSIS — G4733 Obstructive sleep apnea (adult) (pediatric): Principal | ICD-10-CM

## 2020-03-28 DIAGNOSIS — Z794 Long term (current) use of insulin: Principal | ICD-10-CM

## 2020-03-28 DIAGNOSIS — E1340 Other specified diabetes mellitus with diabetic neuropathy, unspecified: Principal | ICD-10-CM

## 2020-03-28 DIAGNOSIS — F4322 Adjustment disorder with anxiety: Principal | ICD-10-CM

## 2020-03-28 DIAGNOSIS — Z955 Presence of coronary angioplasty implant and graft: Principal | ICD-10-CM

## 2020-03-28 DIAGNOSIS — Z550 Illiteracy and low-level literacy: Principal | ICD-10-CM

## 2020-03-28 DIAGNOSIS — I132 Hypertensive heart and chronic kidney disease with heart failure and with stage 5 chronic kidney disease, or end stage renal disease: Principal | ICD-10-CM

## 2020-03-28 DIAGNOSIS — K219 Gastro-esophageal reflux disease without esophagitis: Principal | ICD-10-CM

## 2020-03-28 DIAGNOSIS — Z7902 Long term (current) use of antithrombotics/antiplatelets: Principal | ICD-10-CM

## 2020-03-28 DIAGNOSIS — E785 Hyperlipidemia, unspecified: Principal | ICD-10-CM

## 2020-03-28 DIAGNOSIS — N186 End stage renal disease: Principal | ICD-10-CM

## 2020-03-28 DIAGNOSIS — Z8673 Personal history of transient ischemic attack (TIA), and cerebral infarction without residual deficits: Principal | ICD-10-CM

## 2020-03-28 DIAGNOSIS — I5042 Chronic combined systolic (congestive) and diastolic (congestive) heart failure: Principal | ICD-10-CM

## 2020-03-28 LAB — HIGH SENSITIVITY TROPONIN I - SINGLE: HIGH SENSITIVITY TROPONIN I: 97 ng/L (ref <=53)

## 2020-03-28 LAB — BASIC METABOLIC PANEL
ANION GAP: 8 mmol/L (ref 5–14)
BLOOD UREA NITROGEN: 36 mg/dL — ABNORMAL HIGH (ref 9–23)
BUN / CREAT RATIO: 6
CALCIUM: 8.1 mg/dL — ABNORMAL LOW (ref 8.7–10.4)
CHLORIDE: 102 mmol/L (ref 98–107)
CO2: 22 mmol/L (ref 20.0–31.0)
CREATININE: 6 mg/dL — ABNORMAL HIGH
EGFR CKD-EPI AA MALE: 11 mL/min/{1.73_m2} — ABNORMAL LOW (ref >=60)
EGFR CKD-EPI NON-AA MALE: 10 mL/min/{1.73_m2} — ABNORMAL LOW (ref >=60)
GLUCOSE RANDOM: 215 mg/dL — ABNORMAL HIGH (ref 70–179)
POTASSIUM: 4.6 mmol/L — ABNORMAL HIGH (ref 3.4–4.5)
SODIUM: 132 mmol/L — ABNORMAL LOW (ref 135–145)

## 2020-03-28 LAB — CBC
HEMATOCRIT: 33.9 % — ABNORMAL LOW (ref 41.0–53.0)
HEMOGLOBIN: 10.1 g/dL — ABNORMAL LOW (ref 13.5–17.5)
MEAN CORPUSCULAR HEMOGLOBIN CONC: 29.9 g/dL — ABNORMAL LOW (ref 31.0–37.0)
MEAN CORPUSCULAR HEMOGLOBIN: 28.2 pg (ref 26.0–34.0)
MEAN CORPUSCULAR VOLUME: 94.5 fL (ref 80.0–100.0)
MEAN PLATELET VOLUME: 8.4 fL (ref 7.0–10.0)
PLATELET COUNT: 221 10*9/L (ref 150–440)
RED BLOOD CELL COUNT: 3.59 10*12/L — ABNORMAL LOW (ref 4.50–5.90)
RED CELL DISTRIBUTION WIDTH: 15.9 % — ABNORMAL HIGH (ref 12.0–15.0)
WBC ADJUSTED: 8.8 10*9/L (ref 4.5–11.0)

## 2020-03-28 MED ADMIN — heparin (porcine) 1000 unit/mL injection 8,000 Units: 8000 [IU] | INTRAVENOUS | @ 19:00:00 | Stop: 2020-03-28

## 2020-03-28 MED ADMIN — isosorbide mononitrate (IMDUR) 24 hr tablet 60 mg: 60 mg | ORAL | @ 02:00:00 | Stop: 2020-03-28

## 2020-03-28 MED ADMIN — ticagrelor (BRILINTA) tablet 90 mg: 90 mg | ORAL | @ 13:00:00 | Stop: 2020-03-28

## 2020-03-28 MED ADMIN — sevelamer (RENVELA) tablet 2,400 mg: 2400 mg | ORAL | @ 17:00:00 | Stop: 2020-03-28

## 2020-03-28 MED ADMIN — insulin lispro (HumaLOG) injection 0-12 Units: 0-12 [IU] | SUBCUTANEOUS | @ 02:00:00 | Stop: 2020-03-28

## 2020-03-28 MED ADMIN — sevelamer (RENVELA) tablet 2,400 mg: 2400 mg | ORAL | @ 13:00:00 | Stop: 2020-03-28

## 2020-03-28 MED ADMIN — insulin lispro (HumaLOG) injection 0-12 Units: 0-12 [IU] | SUBCUTANEOUS | @ 17:00:00 | Stop: 2020-03-28

## 2020-03-28 MED ADMIN — amiodarone (PACERONE) tablet 200 mg: 200 mg | ORAL | @ 13:00:00 | Stop: 2020-03-28

## 2020-03-28 MED ADMIN — insulin lispro (HumaLOG) injection 15 Units: 15 [IU] | SUBCUTANEOUS | @ 13:00:00 | Stop: 2020-03-28

## 2020-03-28 MED ADMIN — insulin glargine (LANTUS) injection 30 Units: 30 [IU] | SUBCUTANEOUS | @ 02:00:00 | Stop: 2020-03-28

## 2020-03-28 MED ADMIN — insulin lispro (HumaLOG) injection 0-12 Units: 0-12 [IU] | SUBCUTANEOUS | @ 12:00:00 | Stop: 2020-03-28

## 2020-03-28 MED ADMIN — gabapentin (NEURONTIN) capsule 300 mg: 300 mg | ORAL | @ 13:00:00 | Stop: 2020-03-28

## 2020-03-28 MED ADMIN — sevelamer (RENVELA) tablet 2,400 mg: 2400 mg | ORAL | @ 02:00:00 | Stop: 2020-03-28

## 2020-03-28 MED ADMIN — ticagrelor (BRILINTA) tablet 90 mg: 90 mg | ORAL | @ 02:00:00 | Stop: 2020-03-28

## 2020-03-28 MED ADMIN — acetaminophen (TYLENOL) tablet 650 mg: 650 mg | ORAL | @ 22:00:00 | Stop: 2020-03-28

## 2020-03-28 MED ADMIN — insulin lispro (HumaLOG) injection 15 Units: 15 [IU] | SUBCUTANEOUS | @ 02:00:00 | Stop: 2020-03-28

## 2020-03-28 MED ADMIN — insulin lispro (HumaLOG) injection 15 Units: 15 [IU] | SUBCUTANEOUS | @ 23:00:00 | Stop: 2020-03-28

## 2020-03-28 MED ADMIN — ezetimibe (ZETIA) tablet 10 mg: 10 mg | ORAL | @ 13:00:00 | Stop: 2020-03-28

## 2020-03-28 MED ADMIN — allopurinoL (ZYLOPRIM) tablet 200 mg: 200 mg | ORAL | @ 13:00:00 | Stop: 2020-03-28

## 2020-03-28 MED ADMIN — gabapentin (NEURONTIN) capsule 300 mg: 300 mg | ORAL | @ 02:00:00 | Stop: 2020-03-28

## 2020-03-28 MED ADMIN — atorvastatin (LIPITOR) tablet 80 mg: 80 mg | ORAL | @ 02:00:00 | Stop: 2020-03-28

## 2020-03-28 MED ADMIN — mirtazapine (REMERON) tablet 7.5 mg: 7.5 mg | ORAL | @ 02:00:00 | Stop: 2020-03-28

## 2020-03-28 MED ADMIN — insulin lispro (HumaLOG) injection 0-12 Units: 0-12 [IU] | SUBCUTANEOUS | @ 23:00:00 | Stop: 2020-03-28

## 2020-03-28 MED ADMIN — FLUoxetine (PROzac) capsule 80 mg: 80 mg | ORAL | @ 02:00:00 | Stop: 2020-03-28

## 2020-03-28 MED ADMIN — insulin lispro (HumaLOG) injection 15 Units: 15 [IU] | SUBCUTANEOUS | @ 17:00:00 | Stop: 2020-03-28

## 2020-03-28 MED ADMIN — pantoprazole (PROTONIX) EC tablet 40 mg: 40 mg | ORAL | @ 13:00:00 | Stop: 2020-03-28

## 2020-03-28 MED ADMIN — sevelamer (RENVELA) tablet 2,400 mg: 2400 mg | ORAL | @ 23:00:00 | Stop: 2020-03-28

## 2020-03-28 NOTE — Unmapped (Signed)
Care Management  Initial Transition Planning Assessment  CM met with patient in pt room.  Pt/visitors were not wearing hospital provided masks for the duration of the interaction with CM.   CM was wearing hospital provided surgical mask and hospital provided eye protection.  CM was within 6 foot of the patient/visitors during this interaction.               General  Care Manager assessed the patient by : In person interview with patient,Discussion with Clinical Care team,Medical record review  Orientation Level: Oriented X4  Functional level prior to admission: Independent  Reason for referral: Discharge Planning    Contact/Decision Maker  Extended Emergency Contact Information  Primary Emergency Contact: Silveria,[name]  Address: [address]           [place], [place] [address] [place] Phone: [phone]  Relation: Spouse  Secondary Emergency Contact: Remlinger,Bridgette   [place]  Home Phone: [phone]  Mobile Phone: [phone]  Relation: Daughter    Legal Next of Kin / Guardian / POA / Advance Directives     [name] (HCPOA): Lindseth,[name] - [phone]    [name], First Alternate: Remsen,Bridgette - Daughter - [phone]    Advance Directive (Medical Treatment)  Does patient have an advance directive covering medical treatment?: Patient has advance directive covering medical treatment, copy not in chart.  Advance directive covering medical treatment not in Chart:: Copy requested from other (Comment)    Health Care Decision Maker [[name]] (Medical & Mental Health Treatment)  Healthcare Decision Maker: [name] documented in the [name]/Contact Info section.  Information offered on [name], Medical & Mental Health advance directives:: Patient declined information.    Advance Directive (Mental Health Treatment)  Does patient have an advance directive covering mental health treatment?: Patient does not have advance directive covering mental health treatment.  Reason patient does not have an advance directive covering mental health treatment:: [name] documented in the [name]/Contact Info section.    Patient Information  Lives with: Spouse/significant other    Type of Residence: Private residence        Location/Detail: [address] [place] [address] 2 [address] denies issues    Support Systems/Concerns: Spouse,Children    Responsibilities/Dependents at home?: No    Home Care services in place prior to admission?: No           Equipment Currently Used at Home: cane, straight,wheelchair, manual,walker, rolling,wheelchair, power,shower chair,respiratory supplies  Current [employer] (Name/Phone #): [facility]    Currently receiving outpatient dialysis?: Yes  Facility providing dialysis (Name/Contact Info): [facility] [place] [name] 6 am  Type of Residence: Mailing Address:  [address]  [place] [address]  Contacts: Accompanied by: Alone  Patient Phone Number: [phone]        Medical Provider(s): Kurtis Bushman, MD  Reason for Admission: Admitting Diagnosis:  Coronary artery disease of native artery of native heart with stable angina pectoris (CMS-[name]) [I25.118]  Past Medical History:   has a past medical history of Angina at rest (CMS-[name]), Arthritis, Asthma, Atrial fibrillation and flutter (CMS-[name]) (06/08/14), BMI 40.0-44.9, adult (CMS-[name]) (10/29/2015), Chronic anticoagulation (02/17/2018), Chronic pain syndrome (04/11/2014), Coronary artery disease, Depression, Diabetes mellitus (CMS-[name]), ESRD (end stage renal disease) on dialysis (CMS-[name]), Eye trauma (1998), GERD (gastroesophageal reflux disease), Gout, Homeless, Hyperlipidemia, Hypertension, Illiteracy and low-level literacy, Microscopic hematuria, Migraine, Nephrolithiasis, 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  Past Surgical History:   has a past surgical history that includes Knee surgery (Right, 09/23/2006); Cardiac catheterization (03/08/2007); Cystourethroscopy (12/31/2009 ); pr inser hart pacer xvenous atrial (N/A, 05/30/2015); pr compre ep eval abltj 3d mapg tx svt (N/A, 07/19/2015); pr upper gi endoscopy,biopsy (N/A, 02/28/2016); pr upper gi endoscopy,diagnosis (N/A, 05/07/2016); pr cath place/coron angio, img super/interp,r&l hrt cath, l hrt ventric (N/A, 02/13/2017); pr cath place/coron angio, img super/interp,w left heart ventriculography (N/A, 03/17/2017); pr insert/place flow direct cath (N/A, 03/17/2017); pr ecmo/ecls initiation veno-arterial (N/A, 03/19/2017); pr rebl ves direct,low extrem (Left, 03/19/2017); pr ecmo/ecls rmvl prph cannula open 6 yrs & older (Left, 03/25/2017); pr remv art clot iliac-pop,leg incis (Left, 03/25/2017); pr negative pressure wound therapy dme >50 sq cm (Left, 03/25/2017); pr insj/rplcmt perm dfb w/trnsvns lds 1/dual chmbr (N/A, 05/04/2017); pr prq trluml coronary stent w/angio one art/brnch (N/A, 03/12/2018); pr cath place/coron angio, img super/interp,w left heart ventriculography (N/A, 05/12/2018); pr cath place/coron angio, img super/interp,w left heart ventriculography (N/A, 10/22/2018); Coronary stent placement; pr cath place/coron angio, img super/interp,w left heart ventriculography (N/A, 08/19/2019); and pr prq trluml coronary stent w/angio one art/brnch (N/A, 01/27/2020).   Previous admit date: 02/24/2020    Primary Insurance- Payor: MEDICARE / Plan: MEDICARE PART A AND PART B / Product Type: *No Product type* /   Secondary Insurance ??? Secondary Insurance  MEDICAID [place]  Prescription Coverage ??? yes  Preferred Pharmacy - [facility] - [place], [place] - [address]  [place] [facility]  [facility]  [facility]  [facility] - [place] ([place] - [address]    Transportation home: Production assistant, radio Information       Need for financial assistance?: No       Social Determinants of Health  Social Determinants of Health     Tobacco Use: High Risk   ??? Smoking Tobacco Use: Never Smoker   ??? Smokeless Tobacco Use: Current User   Alcohol Use: Not At Risk   ??? How often do you have a drink containing alcohol?: Never   ??? How many drinks containing alcohol do you have on a typical day when you are drinking?: Not on file   ??? How often do you have 5 or more drinks on one occasion?: Never   Financial Resource Strain: Low Risk    ??? Difficulty of Paying Living Expenses: Not very hard   Food Insecurity: No Food Insecurity   ??? Worried About Running Out of Food in the Last Year: Never true   ??? Ran Out of Food in the Last Year: Never true   Transportation Needs: No Transportation Needs   ??? Lack of Transportation (Medical): No   ??? Lack of Transportation (Non-Medical): No   Physical Activity: Not on file   Stress: Stress Concern Present   ??? Feeling of Stress : Rather much   Social Connections: Moderately Isolated   ??? Frequency of Communication with Friends and Family: More than three times a week   ??? Frequency of Social Gatherings with Friends and Family: Twice a week   ??? Attends Religious Services: Never   ??? Active Member of Clubs or Organizations: No   ??? Attends Banker Meetings: Never   ???  Marital Status: Married   Catering manager Violence: Not At Risk   ??? Fear of Current or Ex-Partner: No   ??? Emotionally Abused: No   ??? Physically Abused: No   ??? Sexually Abused: No   Depression: Not at risk   ??? PHQ-2 Score: 2   Housing/Utilities: Low Risk    ??? Within the past 12 months, have you ever stayed: outside, in a car, in a tent, in an overnight shelter, or temporarily in someone else's home (i.e. couch-surfing)?: No   ??? Are you worried about losing your housing?: No   ??? Within the past 12 months, have you been unable to get utilities (heat, electricity) when it was really needed?: No   Substance Use: Not on file   Health Literacy: Low Risk    ??? : Never       Discharge Needs Assessment  Concerns to be Addressed: denies needs/concerns at this time,adjustment to diagnosis/illness    Clinical Risk Factors: New Diagnosis,Multiple Diagnoses (Chronic)    Barriers to taking medications: No    Prior overnight hospital stay or ED visit in last 90 days: Yes    Readmission Within the Last 30 Days: current reason for admission unrelated to previous admission    Patient's Choice of Community Agency(s): [facility]    Anticipated Changes Related to Illness: none    Equipment Needed After Discharge: none    Discharge Facility/Level of Care Needs: other (see comments) (home self care)    Readmission  Risk of Unplanned Readmission Score: UNPLANNED READMISSION SCORE: 42%  Predictive Model Details          43% (High)  Factor Value    Calculated 03/28/2020 12:04 29% Number of active Rx orders 68    [place] Risk of Unplanned Readmission Model 14% Number of ED visits in last six months 4     12% Number of hospitalizations in last year 4     6% ECG/EKG order present in last 6 months     6% Charlson Comorbidity Index 8     6% Latest calcium low (8.1 mg/dL)     5% Latest BUN high (36 mg/dL)     5% Diagnosis of electrolyte disorder present     4% Imaging order present in last 6 months     4% Latest hemoglobin low (10.1 g/dL)     3% Active anticoagulant Rx order present     3% Latest creatinine high (6.00 mg/dL)     3% Age 16     2% Diagnosis of renal failure present     1% Future appointment scheduled     1% Current length of stay 1.099 days     1% Active ulcer medication Rx order present      Readmitted Within the Last 30 Days? (No if blank) Yes  Patient at risk for readmission?: Yes    Discharge Plan  Screen findings are: Discharge planning needs identified or anticipated (Comment).    Expected Discharge Date: 03/28/2020    Expected Transfer from Critical Care:      Quality data for continuing care services shared with patient and/or representative?: N/A  Patient and/or family were provided with choice of facilities / services that are available and appropriate to meet post hospital care needs?: Yes   List choices in order highest to lowest preferred, if applicable. : [facility] if needed, [facility]  [place]    Initial Assessment complete?: Yes

## 2020-03-28 NOTE — Unmapped (Signed)
Nephrology ESRD Consultation Note    Requesting Attending Physician :  Ardeth Sportsman, MD  Service Requesting Consult : Cardiology [facility])    Reason for consult: ESRD, hemodialysis dependence    Outpatient dialysis unit: [place]  Outpatient dialysis schedule: [name]    Assessment/Recommendations: Jeremy Oliver is a 49 y.o. male with past medical history notable for ESRD on HD presenting for LHC found to have severe 2v CAD including 95% LAD/DIAG stenosis, 80% distal RCA s/p PCI. We will assist with dialysis while hospitalized.     ESRD:  4 hrs, 2K, 2.25Ca will do 2.5 while hospitalized, 137Na, 35Bicarb, 37C. Dialyzer: 180. Receiving heparin 8000 unit bolus    Volume/ hypertension: EDW 155.5. Will attempt to achieve dry weight if tolerated    Anemia of CKD: Hgb 11.4. 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 Receives calcitriol 1 mcg with each treatment..     Vascular access: right IJ tunneled catheter without signs of infection    Hepatitis status: We will confirm with OP records in am.       Additional recommendations:  -Avoid nephrotoxic drugs; dose all meds for creatinine clearance < 10 ml/min   -Unless absolutely necessary, no MRIs with gadolinium.   -Implement save arm precautions.  Prefer needle sticks in the dorsum of the hands or wrists.  No blood pressure measurements in arm.  -If blood transfusion is requested during hemodialysis sessions, please alert [place] prior to the session.   -If a hemodialysis catheter line culture is requested, please alert [place] as only hemodialysis nurses are able to collect those specimens.     **Please contact Quin Hoop PRIOR to hospital discharge so that the nephrology team can arrange appropriate dialysis-related follow-up.**    HPI  Jeremy Oliver is a 49 y.o. male with PMH of ESRD on HD [name], with extensive CAD history and most recent PCI to LM/LAD in late 12/2019. Presenting for LHC due to ongoing angina during dialysis sessions. He says on Christmas (Saturday) he felt unwell for much of the day, threw up after having PM meds, and felt like his ICD shocked him then. He has felt better since this with no more episodes. He underwent LHC this morning and was found to have severe 2v CAD including 95% LAD/DIAG stenosis, 80% distal RCA s/p PCI.   Currently he has no CP, and denies any acute complaints.   Was last dialyzed on Sunday. We will plan to dialyze tomorrow.   ??  Medications:   Current Facility-Administered Medications   Medication Dose Route Frequency Provider Last Rate Last Admin   ??? acetaminophen (TYLENOL) tablet 650 mg  650 mg Oral Q4H PRN Damien Fusi, MD       ??? amiodarone in dextrose,iso-osm (NEXTERONE) 150 mg/100 mL (1.5 mg/mL) IVPB            ??? fentaNYL (PF) (SUBLIMAZE) 50 mcg/mL injection            ??? fentaNYL (PF) (SUBLIMAZE) 50 mcg/mL injection            ??? heparin (porcine) 1,000 unit/mL 1000 unit/mL injection            ??? heparin (porcine) 1,000 unit/mL 1000 unit/mL injection            ??? lidocaine (XYLOCAINE) 20 mg/mL (2 %) injection            ??? midazolam (PF) (VERSED) 1 mg/mL injection            ???  nitroglycerin (NITROSTAT) SL tablet 0.4 mg  0.4 mg Sublingual Q5 Min PRN Damien Fusi, MD       ??? sodium chloride (NS) 0.9 % infusion  50 mL/hr Intravenous Continuous Marlaine Hind, MD 50 mL/hr at 03/27/20 0805 50 mL/hr at 03/27/20 0805   ??? ticagrelor (BRILINTA) 90 mg tablet            ??? ticagrelor (BRILINTA) tablet 90 mg  90 mg Oral BID Damien Fusi, MD            ALLERGIES  Losartan and Morphine    MEDICAL HISTORY  Past Medical History:   Diagnosis Date   ??? Angina at rest (CMS-[name])    ??? Arthritis    ??? Asthma    ??? Atrial fibrillation and flutter (CMS-[name]) 06/08/14   ??? BMI 40.0-44.9, adult (CMS-[name]) 10/29/2015   ??? Chronic anticoagulation 02/17/2018   ??? Chronic pain syndrome 04/11/2014    ~Use daily lyrica as recommended ~Tramadol as needed for pain ~Fluoxetine daily as recommended ~Daily exercise ~Eat a healthy diet ~Good control of your blood sugars can help    ??? Coronary artery disease    ??? Depression    ??? Diabetes mellitus (CMS-[name])    ??? ESRD (end stage renal disease) on dialysis (CMS-[name])    ??? Eye trauma 1998    glass in both eyes and removed   ??? GERD (gastroesophageal reflux disease)    ??? Gout    ??? Homeless    ??? Hyperlipidemia    ??? Hypertension    ??? Illiteracy and low-level literacy    ??? Microscopic hematuria    ??? Migraine    ??? Nephrolithiasis    ??? 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 Nonalcoholic fatty liver disease    ??? NSTEMI (non-ST elevated myocardial infarction) (CMS-[name]) 01/13/2017   ??? Obesity    ??? Obstructive sleep apnea 2009   ??? Panic attacks    ??? 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 Seizure-like activity (CMS-[name])     ~Monitor for symptoms and report them to your primary care provider if they occur    ??? Systolic CHF, chronic (CMS-[name])     EF 40-45% 2017   ??? TIA (transient ischemic attack)         SOCIAL HISTORY  Social History     Socioeconomic History   ??? Marital status: Married     Spouse name: [name]   ??? Number of children: 3   ??? Years of education: 41   ??? Highest education level: Not on file   Occupational History   ??? Occupation: DISABLED     Comment: Former IT sales professional   Tobacco Use   ??? Smoking status: Never Smoker   ??? Smokeless tobacco: Current User     Types: Chew   ??? Tobacco comment: 08/15/19:  Dips 1x a day./ /Pt in process of reducing tobacco use,  dips 1 can every 3 weeks,   Vaping Use   ??? Vaping Use: Never used   Substance and Sexual Activity   ??? Alcohol use: Yes  Alcohol/week: 0.0 standard drinks Comment: rare use: couple times a year, small amount   ??? Drug use: Never   ??? Sexual activity: Not Currently     Partners: Female   Other Topics Concern   ??? Do you use sunscreen? No   ??? Tanning bed use? No   ??? Are you easily burned? No   ??? Excessive sun exposure? No   ??? Blistering sunburns? No   Social History Narrative    Information updated:  01/08/16. Reviewed last: 12/01/18, 08/15/19        Born in [place]    Raised with both parents    Parents died w/in 6 mos of each other. His parents knew they were going to pass and told him just before.    Half sister and 2 half brothers        Married 31 yrs. Wife [name]    2 other children son and daughter, adopted out    Daughter Bridgette, bf DJ        Education - completed through 11th. 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    Former Presenter, broadcasting since age 65    Lives with wife, difficulty paying bills 12/30/17     Lives next door to daughter, husband and their grandson        05/19/18: pt moving to new home in next 5 days- after an eviction. Has Medicaid assistance again. Will be w/ wife [name], they babysit their grandson often.    06/14/18: Pt and husband living with daughter while new location to live is determined.    12/01/18: pt living with wife and friends. Friends have two young children. [name] states soon they will have their own place, however doesn't know when. Pt states it will be after the neighbors are evicted, we can move in. Pt will need to start dialysis soon. Pt and wife Not talking with daughter and grandson lately.      Social Determinants of Health     Financial Resource Strain: Low Risk    ??? Difficulty of Paying Living Expenses: Not very hard   Food Insecurity: No Food Insecurity   ??? Worried About Running Out of Food in the Last Year: Never true   ??? Ran Out of Food in the Last Year: Never true   Transportation Needs: No Transportation Needs   ??? Lack of Transportation (Medical): No   ??? Lack of Transportation (Non-Medical): No   Physical Activity: Not on file   Stress: Stress Concern Present   ??? Feeling of Stress : Rather much   Social Connections: Moderately Isolated   ??? Frequency of Communication with Friends and Family: More than three times a week   ??? Frequency of Social Gatherings with Friends and Family: Twice a week   ??? Attends Religious Services: Never   ??? Active Member of Clubs or Organizations: No   ??? Attends Banker Meetings: Never   ??? Marital Status: Married        FAMILY HISTORY  Family History   Problem Relation Age of Onset   ??? Diabetes Mother    ??? Hyperlipidemia Mother    ??? Heart disease Mother    ??? Basal cell carcinoma Mother    ??? Blindness Mother  diabeties   ??? Obesity Mother    ??? Hyperlipidemia Father    ??? Heart disease Father    ??? Lung disease Father    ??? Heart disease Half-Sister    ??? Kidney disease Half-Sister    ??? Gallbladder disease Half-Brother    ??? Obesity Half-Brother    ??? No Known Problems Daughter    ??? No Known Problems Son    ??? Obesity Daughter    ??? Depression Maternal Uncle         Died by suicide in 2017/10/06 by gunshot   ??? Melanoma Neg Hx    ??? Squamous cell carcinoma Neg Hx    ??? Macular degeneration Neg Hx    ??? Glaucoma Neg Hx         Review of Systems:  A 12 system review of systems was negative except as noted in HPI.    Physical Exam:  Vitals:    03/27/20 1500   BP: 156/83   Pulse: 74   Resp: 13   Temp:    SpO2: 100%     I/O this shift:  In: -   Out: 550 [Urine:550]    Intake/Output Summary (Last 24 hours) at 03/27/2020 1746  Last data filed at 03/27/2020 1355  Gross per 24 hour   Intake ???   Output 550 ml   Net -550 ml     General: well-appearing, no acute distress  HEENT: anicteric sclera, mask in place  CV: RRR, no m/r/g, trace edema  Lungs: CTAB, normal wob  Abd: soft, non-tender, non-distended  Skin: no visible lesions or rashes  Psych: alert, engaged, appropriate mood and affect  Neuro: normal speech, no gross focal deficits     Test Results  Reviewed  Lab Results   Component Value Date    NA 132 (L) 03/27/2020    K 4.8 (H) 03/27/2020    CL 100 03/27/2020    CO2 24.0 03/27/2020    BUN 36 (H) 03/27/2020    CREATININE 5.24 (H) 03/27/2020    GFR >= 60 01/09/2012    GLU 254 (H) 03/27/2020    CALCIUM 8.2 (L) 03/27/2020    ALBUMIN 2.9 (L) 02/25/2020    PHOS 4.9 02/26/2020

## 2020-03-28 NOTE — Unmapped (Signed)
Cardiology - Team 3 [facility]) Progress Note    Assessment & Plan:   Jeremy Oliver is a 49 y.o. male with a PMHx of ESRD, HFrEF DM2, Paroxymal  Afib, Essential hypertension, asthma, OSA, chronic pain, Tobacco use,  CAD s/p PCI in October of this year to the LMCA/LA  that presented to [facility] with ongoing angina during dialysis sessions.     Principal Problem:    Coronary artery disease of native artery of native heart with stable angina pectoris (CMS-[name])  Resolved Problems:    * No resolved hospital problems. *     CAD/Angina: Patient has extensive CAD. Underwent PCI on October this year to the LMCA and LAD. Has been having on angina during dialysis since that time.    -Ticagrelor 90 mg BID  -Atorvastatin 80 mg nightly  -Zetia 10mg  daily   -Isosorbide 60mg  (Tues-Thur-Sat)    -LHC 03/27/20--Right Femoral access  Right groin without hematoma or bleeding. Dressing C/D/I    Finding:   Severe 2v CAD including 95% LAD/DIAG stenosis, 80% distal RCA  LVEDP 34 mm Hg  -PCI with balloon-only angioplasty to LAD and RCA, Xience stent x 1 to DIAG     Paroxymal Afib  -Eliquis 5mg  daily  -Amio 200 mg daily     HFrEF (20-25%)  -ECHO 02/24/20  -Metoprolol XL 25mg  daily (Tues-Thur-Sat)    Diabetes  -SSI 0-12 units  Lispro 15 units TID     Neuropathy   -Gabapentin 300 mg BID     Idiopathic chronic gout of left knee  -Allopurinol 200 mg     ESRD  -Last Dialysis on Sunday  -Dialysis access Right internal jugular   -LVEDP 34 mm Hg  -Admit for acute dialysis for volume removal  -Nephrology consulted to assist with dialysis.   -Will dialyze tomorrow 03/28/20  -Sevelamer 2,400 mg TID    Anemia of CKD  -Hgb stable 11.4 today  -Not on any ESA's    Anxiety/Depression  -Remeron 7.5mg    -Prozac 80 mg     Chronic Problems:  ESRD  Diabetes  Obesity  Atrial fibrillation     Daily Checklist:  Diet: Regular Diet  DVT PPx: Patient Already on Full Anticoagulation with Eliquis and Ticagrelor  Electrolytes: Replete Potassium to >/=4 and Magnesium to >/=2  Code Status: Full Code  Dispo: Home    Team Contact Information:   Primary Team: Cardiology - Team 3 ([id number])  Primary APP: Colman Cater, NP  Resident's Pager: [phone] (Cardiology Team 2 Senior Resident)    Interval History:   No acute events overnight.    ROS: Denies headache, chest pain, shortness of breath, abdominal pain, nausea, vomiting.    Objective:   Temp:  [36.7 ??C (98.1 ??F)-37.3 ??C (99.1 ??F)] 36.7 ??C (98.1 ??F)  Heart Rate:  [70-81] 78  SpO2 Pulse:  [71-81] 71  Resp:  [11-20] 20  BP: (133-185)/(76-97) 185/97  SpO2:  [97 %-100 %] 100 %    Gen: Well-Appearing and in NAD, answers questions appropriately  Eyes: sclera anicteric, EOMI  HENT: atraumatic, MMM, OP w/o erythema or exudate   Heart: RRR, S1, S2, no M/R/G, no chest wall tenderness  Lungs: CTAB, no crackles or wheezes, no use of accessory muscles  Abdomen: Normoactive bowel sounds, soft, NTND, no rebound/guarding  Extremities: no clubbing, cyanosis, or edema in the BLEs  Psych: Alert, oriented, appropriate mood and affect    Labs/Studies: Labs and Studies from the last 24hrs per EMR and Reviewed  Juanda Bond NP-C  Cardiology Med C Team 3     I was immediately available and I discussed the patient with the [facility].   I agree with the assessment and plan of the FNP outlined above.  Talbert Nan. Sherrie Mustache, MD

## 2020-03-28 NOTE — Unmapped (Signed)
Physician Discharge Summary [facility]  3 AD [facility]  [address]  [place] [address]  Dept: [phone]  Loc: [phone]     Identifying Information:   Jeremy Oliver  1970/05/17  [id number]    Primary Care Physician: Kurtis Bushman, MD   Code Status: Full Code    Admit Date: 03/27/2020    Discharge Date: 03/28/2020     Discharge To: Home    Discharge Service: [facility] - Cardiology Floor Team 3 [facility])     Discharge Attending Physician: Marlaine Hind, MD    Discharge Diagnoses:  Principal Diagnosis:   Coronary artery disease involving native coronary artery of native heart without angina pectoris    Secondary Diagnoses:  Chest Pain  HFrEF, Chronic  Mitral Regurgitation  Paroxysmal Atrial Fibrillation  VT  Hypertension  Hyperlipidemia  Diabetes Mellitus Type 2  OSA  End Stage Renal Disease  Stroke      Principal Problem:    Coronary artery disease of native artery of native heart with stable angina pectoris (CMS-[name]) POA: Unknown  Resolved Problems:    * No resolved hospital problems. [facility] Course:   Jeremy Oliver  is a 49 year old male with extensive CAD history and most recent PCI to LM/LAD in late 12/2019. He has had some ongoing angina during dialysis sessions and saw Dr. Andrey Farmer last on 12/9, adjusting medications with addition of ranolazine. He has felt slightly better after this but still has similar sympoms at HD (last session yesterday). He says on Christmas (Saturday) he felt unwell for much of the day, threw up after having PM meds, and felt like his ICD shocked him then. He has felt better since this with no more episodes. He has been holding eliquis since Saturday, not missing any other meds. He presented on 03/27/20 for a LHC.        CAD/Angina: Patient has extensive CAD. Underwent PCI on October this year to the LMCA and LAD. Has been having on angina during dialysis since that time.  ??  Continue:  -Ticagrelor 90 mg BID  -Atorvastatin 80 mg nightly  -Zetia 10mg  daily   -Isosorbide 60mg  (Tues-Thur-Sat)  ??  -LHC 03/27/20--Right Femoral access  Right groin without hematoma or bleeding. Dressing C/D/I  ??  Finding:   Severe 2v CAD including 95% LAD/DIAG stenosis, 80% distal RCA  LVEDP 34 mm Hg  -PCI with balloon-only angioplasty to LAD and RCA, Xience stent x 1 to DIAG   -Follow up appt on 05/18/19 with Dr. Andrey Farmer   -Referral to cardiac rehab made  ??  Hx Paroxymal Afib  Continue:  -Eliquis 5mg  daily  -Amio 200 mg daily   ??  HFrEF (20-25%)  -ECHO 02/24/20  -ICD was interrogated  Finding :   ?? Date in question, 12/25: 5- VT-1 episodes, EGM #1486 treated with ATPx4 and 41J x 1, appears AF/AT with RVR at 190 bpm  ?? 9- NSVT on 12/25, EGMs appear AF/AT with VT/RVR   ?? Prior to 12/25, 14- VT-1 episodes, last on 12/10, therapy ATPx1 on 12/04 EGM #1419 appears AF/AT with VT vs RVR  ?? 10- ATR last today at 1023am   ?? AF/AT burden 6%  ??  Fluid Monitoring  ?? No Evidence of Fluid Accumulation     Continue:  -Start taking Metoprolol XL 25mg  daily  -Stop Jardiance d/t contraindication for dialysis patients   ??  Hx Diabetes  Continue home:  ??-Insulin ASPART (NOVOLOG FLEXPEN  U-100 INSULIN) 100 unit/mL (3 mL) injection pen  Continue insulin detemir U-100 (LEVEMIR FLEXTOUCH U-100 INSULN) 100 unit/mL (3 mL) injection pen    Hx Neuropathy   Continue:  -Gabapentin 300 mg BID   ??  Idiopathic chronic gout of left knee  Continue:  -Allopurinol 200 mg   ??  ESRD- Dialysis days M/W/F  -Dialysis day of discharge-->   Event! Per dialysis nurse, patient receiving dialysis. Towards the end of dialysis, the dialysis nurse checked up on him and saw a glazed look on patient's face. Patient then started convulsing for approx 15 seconds and loss of consciousness. ARRT activated. Patient quickly regained consciousness by the time the ARRT arrived and no longer convulsing. He complained of a headache. Vital signs stable during the rapid. Per tele, patient had alarm of norm. EKG ordered.-- Afib with a rate of 107. Ordered his home Metoprolol XL 25mg  x 1 dose, and changed it to daily dosing. He normally takes it on non-dialysis days. Spoke with Dr. 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   ??  Team ordered and reviewed EKG.  -Dialysis access Right internal jugular   -LVEDP 34 mm Hg  -Admitted Extended stay for acute dialysis for volume removal  -Nephrology consulted to assist with dialysis.   Continue:  -Sevelamer 2,400 mg TID  ??  Anemia of CKD  -Hgb stable 10.1 at discharge  -Not on any ESA's  ??  Anxiety/Depression  Continue:  -Remeron 7.5mg    -Prozac 80 mg     Procedures:  cardiac catheterization and Dialysis    PR CATH PLACE/CORON ANGIO, IMG SUPER/INTERP,W LEFT HEART VENTRICULOGRAPHY [[address]] (Left Heart Catheterization)  ______________________________________________________________________  Discharge Medications:     Your Medication List      STOP taking these medications    JARDIANCE 10 mg tablet  Generic drug: empagliflozin        CONTINUE taking these medications    acetaminophen 500 MG tablet  Commonly known as: TYLENOL  TAKE 2 TABLETS (1000MG ) BY MOUTH EVERY 8 HOURS AS NEEDED FOR PAIN     albuterol 90 mcg/actuation inhaler  Commonly known as: VENTOLIN HFA  Inhale 2 puffs into the lungs every 6 (six) hours as needed for wheezing or shortness of breath.     allopurinoL 100 MG tablet  Commonly known as: ZYLOPRIM  Take 2 tablets (200 mg total) by mouth daily.     amiodarone 200 MG tablet  Commonly known as: PACERONE  TAKE 1 TABLET BY MOUTH ONCE DAILY     atorvastatin 80 MG tablet  Commonly known as: LIPITOR  Take 1 tablet (80 mg total) by mouth daily.     BLOOD PRESSURE CUFF Misc  Generic drug: miscellaneous medical supply  Measure BP once a day or if any symptoms     blood sugar diagnostic Strp  Use as directed to check blood glucose Four (4) times a day (before meals and nightly).     blood-glucose meter kit  Disp. blood glucose meter kit preferred by patient's insurance. Dx: Diabetes, E11.9     bumetanide 2 MG tablet  Commonly known as: BUMEX  Take 1 tablet (2 mg total) by mouth two (2) times a day. On non dialysis days     cholecalciferol (vitamin D3 25 mcg (1,000 units)) 1,000 unit (25 mcg) tablet  Take 1 tablet (25 mcg total) by mouth daily.  diclofenac sodium 1 % gel  Commonly known as: VOLTAREN  Apply 2 grams topically Three (3) times a day.     ELIQUIS 5 mg Tab  Generic drug: apixaban  Take 1 tablet (5 mg total) by mouth Two (2) times a day.     empty container Misc  Commonly known as: SHARPS-A-GATOR DISPOSAL SYSTEM  Use as directed for sharps disposal     ezetimibe 10 mg tablet  Commonly known as: ZETIA  TAKE 1 TABLET BY MOUTH ONCE DAILY     FLUoxetine 40 MG capsule  Commonly known as: PROzac  Take 2 capsules (80 mg total) by mouth daily.     gabapentin 300 MG capsule  Commonly known as: NEURONTIN  Take 1 capsule (300 mg total) by mouth two (2) times a day.     insulin syringe-needle U-100 0.3 mL 31 gauge x 5/16 (8 mm) Syrg  Use to inject insulin twice daily with meals     isosorbide mononitrate 60 MG 24 hr tablet  Commonly known as: IMDUR  Take 1 tablet (60 mg total) by mouth daily. DO NOT TAKE on dialysis days     lancets Misc  Use as directed to check blood glucose Four (4) times a day (before meals and nightly).     lancets Misc  Disp. lancets #200 or amount allowed, Testing QID, Dx: E11.65, Z79.4. (DM type 2 , Insulin Dep)     LEVEMIR FLEXTOUCH U-100 INSULN 100 unit/mL (3 mL) injection pen  Generic drug: insulin detemir U-100  Inject 30 units under the skin nightly.     loperamide 2 mg capsule  Commonly known as: IMODIUM  Take 1 capsule twice a day and another capsule after each episode of diarrhea, up to 8 pills per day.     metoprolol succinate 100 MG 24 hr tablet  Commonly known as: TOPROL XL  Take 1 tablet (100 mg total) by mouth daily.     mirtazapine 7.5 MG tablet  Commonly known as: REMERON  Take 1 tablet (7.5 mg total) by mouth nightly.     nitroglycerin 0.4 mg/dose spray  Commonly known as: NITROLINGUAL  Place 1 spray under the tongue every five (5) minutes as needed for chest pain. pT MUST HAVE SPRAY DUE TO DRY MOUTH AND PILLS DO NOT DISSOLVE     NovoLOG Flexpen U-100 Insulin 100 unit/mL (3 mL) injection pen  Generic drug: insulin ASPART  Inject 15 Units under the skin Three (3) times a day before meals.     omeprazole 40 MG capsule  Commonly known as: PriLOSEC  Take 1 capsule (40 mg total) by mouth Two (2) times a day (30 minutes before a meal).     ondansetron 4 MG tablet  Commonly known as: ZOFRAN  Take 1 tablet (4 mg total) by mouth every twelve (12) hours as needed.     OZEMPIC 0.25 mg or 0.5 mg(2 mg/1.5 mL) Pnij injection  Generic drug: semaglutide  Inject 0.5 mg under the skin every seven (7) days.     PRALUENT PEN 150 mg/mL subcutaneous injection  Generic drug: alirocumab  Inject the contents of 1 pen (150 mg total) under the skin every fourteen (14) days.     ranolazine 500 MG 12 hr tablet  Commonly known as: RANEXA  Take 1 tablet (500 mg total) by mouth Two (2) times a day.     sevelamer 800 mg tablet  Commonly known as: RENVELA  Take 3 tablets (2,400 mg total) by mouth  Three (3) times a day with a meal.     ticagrelor 90 mg Tab  Commonly known as: BRILINTA  TAKE 1 TABLET BY MOUTH TWICE DAILY     UNIFINE PENTIPS 31 gauge x 5/16 (8 mm) Ndle  Generic drug: pen needle, diabetic  USE WITH INSULIN AND VICTOZA 5 TIMES DAILY            Allergies:  Losartan and Morphine  ______________________________________________________________________  Pending Test Results (if blank, then none):      Most Recent Labs:  All lab results last 24 hours -   Recent Results (from the past 24 hour(s))   POCT Glucose    Collection Time: 03/27/20  6:16 PM   Result Value Ref Range    Glucose, POC 211 (H) 70 - 179 mg/dL   POCT Glucose    Collection Time: 03/28/20  6:24 AM   Result Value Ref Range    Glucose, POC 213 (H) 70 - 179 mg/dL   ECG 12 Lead    Collection Time: 03/28/20  8:09 AM   Result Value Ref Range    EKG Systolic BP  mmHg    EKG Diastolic BP  mmHg    EKG Ventricular Rate 79 BPM    EKG Atrial Rate 79 BPM    EKG P-R Interval 240 ms    EKG QRS Duration 98 ms    EKG Q-T Interval 432 ms    EKG QTC Calculation 495 ms    EKG Calculated P Axis 36 degrees    EKG Calculated R Axis -13 degrees    EKG Calculated T Axis 167 degrees    QTC Fredericia 473 ms   Basic Metabolic Panel    Collection Time: 03/28/20  9:16 AM   Result Value Ref Range    Sodium 132 (L) 135 - 145 mmol/L    Potassium 4.6 (H) 3.4 - 4.5 mmol/L    Chloride 102 98 - 107 mmol/L    CO2 22.0 20.0 - 31.0 mmol/L    Anion Gap 8 5 - 14 mmol/L    BUN 36 (H) 9 - 23 mg/dL    Creatinine 1.61 (H) 0.60 - 1.10 mg/dL    BUN/Creatinine Ratio 6     EGFR CKD-EPI Non-African American, Male 10 (L) >=60 mL/min/1.68m2    EGFR CKD-EPI African American, Male 11 (L) >=60 mL/min/1.80m2    Glucose 215 (H) 70 - 179 mg/dL    Calcium 8.1 (L) 8.7 - 10.4 mg/dL   hsTroponin I (single, no delta)    Collection Time: 03/28/20  9:16 AM   Result Value Ref Range    hsTroponin I 97 (HH) <=53 ng/L   CBC    Collection Time: 03/28/20  9:17 AM   Result Value Ref Range    WBC 8.8 4.5 - 11.0 10*9/L    RBC 3.59 (L) 4.50 - 5.90 10*12/L    HGB 10.1 (L) 13.5 - 17.5 g/dL    HCT 09.6 (L) 04.5 - 53.0 %    MCV 94.5 80.0 - 100.0 fL    MCH 28.2 26.0 - 34.0 pg    MCHC 29.9 (L) 31.0 - 37.0 g/dL    RDW 40.9 (H) 81.1 - 15.0 %    MPV 8.4 7.0 - 10.0 fL    Platelet 221 150 - 440 10*9/L   POCT Glucose    Collection Time: 03/28/20 11:36 AM   Result Value Ref Range    Glucose, POC 226 (H) 70 - 179 mg/dL  Relevant Studies/Radiology (if blank, then none):  ECG 12 Lead    Result Date: 03/28/2020  SINUS RHYTHM WITH 1ST DEGREE AV BLOCK LEFT VENTRICULAR HYPERTROPHY WITH REPOLARIZATION ABNORMALITY ( R in aVL ) PROLONGED QT ABNORMAL ECG WHEN COMPARED WITH ECG OF 27-Mar-2020 09:46, NO SIGNIFICANT CHANGE WAS FOUND    ECG 12 Lead    Result Date: 03/27/2020  SINUS RHYTHM WITH 1ST DEGREE AV BLOCK LEFT VENTRICULAR HYPERTROPHY WITH REPOLARIZATION ABNORMALITY ( R in aVL ) PROLONGED QT ABNORMAL ECG WHEN COMPARED WITH ECG OF 27-Mar-2020 07:17, PREMATURE ATRIAL BEATS ARE NO LONGER PRESENT    ECG 12 lead    Result Date: 03/27/2020  SINUS RHYTHM WITH 1ST DEGREE AV BLOCK WITH PREMATURE ATRIAL BEATS LEFT VENTRICULAR HYPERTROPHY WITH REPOLARIZATION ABNORMALITY ( R in aVL ) PROLONGED QT ABNORMAL ECG WHEN COMPARED WITH ECG OF 03-Mar-2020 18:33, PREMATURE ATRIAL BEATS ARE NOW PRESENT T WAVE INVERSION NOW EVIDENT IN ANTERIOR LEADS    Cath/Vascular Procedure    Result Date: 03/27/2020  Cardiac Catheterization Laboratory [facility], [place] Tel: [phone] Fax: [phone] CARDIAC CATHETERIZATION REPORT Date of Procedure: March 27, 2020 9:36 AM ___________________________________________________________________________ _ Findings: 1. Coronary artery disease including 80% ostial D1 stenosis as well as 90% ISR of the mid-LAD at the bifurcation of D1 now s/p successful PCI to D1 with a 2.25x15 Xience Skypoint DES. 2. 90% in-stent restenosis of the distal RCA also appreciated and new compared to 07/2019 study. IVUS confirmed ISR with well-expanded stents. Now s/p PTCA of the distal RCA/rPDA with a 4.0x12 Rainbow balloon to high pressure. 3. Elevated left ventricular filling pressures (LVEDP = 30 mm Hg). Recommendations: 1. Admit to [facility] 3 team for observation and inpatient HD. If patient is stable and asymptomatic with HD session, can safely discharge home on apixiban/ticagrelor 2. Continue home ticagrelor. Plan to restart apixiban at 12/29 PM dose unless there are access-site complications 3. Follow up with primary cardiologist. Complications: ?? None ___________________________________________________________________________ _ Referring Physician: Hoy Finlay, MD Performing Attending: Hoy Finlay, MD Diagonstic Fellow: Antionette Poles, MD Interventional Fellow: Luretha Rued, MD Procedures performed: Left Heart Catheterization & Coronary Angiography Intravascular Ultrasound (IVUS) Percutaneous Coronary Intervention (PCI) Access Site: Right Femoral Artery Arterial Closure: Manual Compression Contrast Volume: 160 mL Fluoroscopy Time: 18.2 mins Radiation Dose: 3091 mGy ___________________________________________________________________________ _ History 49 year old male with extensive CAD history and most recent PCI to LM/LAD in late 12/2019. He has had some ongoing angina during dialysis sessions and saw Dr. Andrey Farmer last on 12/9, adjusting medications with addition of ranolazine. He has felt slightly better after this but still has similar sympoms at HD (last session yesterday). Presenting for LHC with possible PCI. Procedure Left Heart Catheterization (R. Femoral) Under Lidocaine 2% local anesthesia, a long 14F sheath was placed in the right femoral artery using modified ultrasound guidance. There was some technical difficulty with sheath placement requiring multiple micropuncture systems, a stiff Amplatz wire, and a Rosen wire. Left heart catheterization was performed by advancing a 14F JR4 catheter across the aortic valve. The LVEDP was obtained. Selective coronary angiography was then performed in multiple views by hand injections of Omnipaque using a 14F JL4 catheter to engage the left coronary artery and a 14F JR4 catheter to engage the right coronary artery. Percutaneous Coronary Intervention The decision was made to intervene on the LAD/D1 bifurcation. PCI to LAD/D1 -- 14F EBU 3.5 guide catheter used to engage the LCA -- Runthrough advanced to distal D1 -- [place]amo advanced to distal LAD --  2.25x12 compliant in D1 to 10 ATM x3 -- 3.0x15 Vega in mid-LAD to 10, 12, then 16 ATM moving proximally -- AF with RVR vs VT vs paced rhythm appreciated post PTCA. 150 mg IV amiodarone given. -- Xience Skypoint 2.25x15 DES deployed in the ostium of D1 at 16 ATM -- Stent balloon inflated to 10 ATM at the bifurcation of LAD/D1 -- 2.0x12 and 3.0x12 [place] balloons inserted into D1 and LAD, respectively. -- Kissing technique up to 12 ATM for both balloons -- 2.0 balloon removed -- 3.x12 [place] inflated to 16 ATM x2 in the LAD -- Excellent angiographic result Attention was shifted to the distal RCA, see below. IVUS An [place] IVUS catheter was advanced down the wire and used to visualize the distal RCA. The true lumen size of the stented segment of interest was 4.0 mm into the rPDA and back to the proximal portion of the stent. There angiographic area of significant stentosis revealed ISR within a well-expanded stent. PCI to distal RCA ?? Anticoagulation: Heparin ?? Antiplatelet: Ticagrelor ?? Guide: 50F JR4 ?? Guidewire(s): Minamo ?? Intracoronary imaging: IVUS (above)  ?? Pre-dilation balloon(s): 4.0x12 [place] to 16 then 18 ATM ?? Result: Excellent angiographic result with TIMI 3 flow.  Following the procedure, the sheath was sutured in place. Once the ACT was < 140, the sheath was removed and manual compression was applied to achieve hemostasis. The patient tolerated the procedure well without any complications. ___________________________________________________________________________ _ FINDINGS Hemodynamics and Left Heart Catheterization ?? Aortic pressure: 146/92 mm Hg (mean 116 mm Hg) ?? Left ventricular filling pressure: 143/20 (LVEDP = 30 mm Hg). Left Ventriculogram ?? RAO Left Ventriculogram:Not Performed Coronary Angiography Dominance: Right Left Main: The left main coronary artery (LMCA) is a large-caliber vessel that originates from the left coronary sinus. It bifurcates into the left anterior descending (LAD) and left circumflex (LCx) arteries. There is a patent stent in the distal LMCA without significant stenosis. LAD: The LAD is a large-caliber vessel that gives off 1 diagonal (D) branch before it wraps around the apex. D1 is a moderate-caliber vessel with 90% stenosis at ostium and at the bifurcation of the LAD immediately proximal. Previous stents in the proximal and mid-LAD remain widely patent. Left Circumflex: The LCx is a large-caliber vessel that gives off 3 obtuse marginal (OM) branches and then continues as a small vessel in the AV groove. OM1 is a very small-caliber vessel. OM2 is a moderate-caliber branching vessel.  OM 3 is a small caliber distal vessel.  There is overall mild disease in the LCx with patent stents in the proximal and mid segments. Right Coronary: The right coronary artery (RCA) is a large-caliber vessel originating from the right coronary sinus. It bifurcates distally into the posterior descending artery (PDA) and a posterolateral (PL) branch consistent with a right dominant system. There is focal in-stent restenosis within a well-expanded stent in the distal RCA (see IVUS above) as well as patent stent proximally in the mid-RCA and diffuse mild to moderate stenosis in the PL. PCI Indication Presentation:  (STEMI, NSTEMI, Unstable angina, Stable angina):  Unstable Angina Angina class:  (Class I-IV):  IV Assessment for dual-antiplatelet therapy:  Suitable for ticagrelor + apixiban Pre-procedure non-invasive study:  (low, intermediate, high risk): N/A FFR (if performed): N/A Clinical Predictors: Cardiogenic shock prior to procedure:No NYHA functional class :3 Procedure Status: Urgent Dialysis: No Cardiac arrrest prior to PCI: No CHSA Frailty Score:  5: Mildly Frail Syntax Score:  High Risk Complications:  None Blood loss:  Minimal Specimen:  None Device/Implants:  2.25 x 15 mm Xience DES Pre-procedure Dx:  Unstable Angina Post-procedure Dx:  Same I personally spent 83 minutes continuously monitoring the patient face to face during the administration of moderate sedation.  Independent observer RN was present for the duration of the procedure to assist in patient monitoring. Pre and post sedation activities have been reviewed. ___________________________________________________________________________ Antionette Poles, MD Cardiology Fellow, PGY 4 Luretha Rued, MD Cardiology Fellow, PGY 7 As PCI was performed / angina was present, cardiac rehab referral ordered and was discussed with patient ___________________________________________________________________________ _ I was present for the entire duration of the procedure, as detailed above Hoy Finlay, MD     ______________________________________________________________________  Discharge Instructions:                        Appointments which have been scheduled for you    Apr 02, 2020  4:00 PM  (Arrive by 3:45 PM)  RETURN COUNSELING with Ebbie Latus, LCSW  [facility]NAL MEDICINE [place] REGION) [address]  [place] [address]  [phone]      Apr 05, 2020  1:30 PM  (Arrive by 1:00 PM)  VASCULAR ACCESS with Leona Carry, MD  [facility] TRANSPLANT SURGERY [place] [facility] REGION) [address]  [place] [address]  [phone]      Apr 06, 2020 11:00 AM  (Arrive by 10:45 AM)  RETURN EP with Joretta Bachelor, MD  [facility] CARDIOLOGY [place] [facility] REGION) [address]  [place] [address]  [phone]      Apr 10, 2020  1:20 PM  (Arrive by 1:05 PM)  HOSPITAL FOLLOW UP RETURN FM with [facility] HOSPITAL FOLLOW-UP  [facility] INTERNAL MEDICINE [place] [facility] REGION) [address]  [place] [address]  [phone]      May 01, 2020 12:00 AM  REMOTE ICD FOLLOW-UP with [facility] EP REMOTE MONITORING  [facility] EP REMOTE MONITORING [facility] REGION) [address]  [address] Floor [place] [address]  [phone]   This is a remote appointment, you do not need to come into the office.     May 08, 2020  8:20 AM  (Arrive by 8:05 AM)  RETURN CONTINUITY with Roma Schanz, MD  [facility] INTERNAL MEDICINE [place] [facility] REGION) [address]  [place] [address]  [phone]      May 17, 2020  9:00 AM  (Arrive by 8:45 AM)  ECHOCARDIOGRAM W COLORFLOW SPECTRAL DOPPLER with ET FL 2 ECHO RM 1  IMG ECHO [place] [facility] [place]) [address]  [place] [address]  [phone]      May 17, 2020 10:15 AM  (Arrive by 10:00 AM)  Danelle Earthly RETURN with Marlaine Hind, MD  [facility] CARDIOLOGY [place] [facility] REGION) [address]  [place] [address]  [phone]      [place] 03, 2022 12:00 AM  REMOTE ICD FOLLOW-UP with [facility] EP REMOTE MONITORING  [facility] EP REMOTE MONITORING [place] [facility] REGION) [address]  [address] Floor [place] [address]  [phone]   This is a remote appointment, you do not need to come into the office.     Oct 30, 2020 12:00 AM  REMOTE ICD FOLLOW-UP  with [facility] EP REMOTE MONITORING  [facility] EP REMOTE MONITORING [place] [facility] REGION) [address]  [address] [place] [address]  [phone]   This is a remote appointment, you do not need to come into the office.          ______________________________________________________________________  Discharge Day Services:  BP 171/79  - Pulse 78  - Temp 36.6 ??C (97.8 ??F) (Oral)  - Resp 18  - Ht 188 cm (6' 2.02)  - Wt (!) 160 kg (352 lb 12.8 oz)  - SpO2 98%  - BMI 45.28 kg/m??   Pt seen on the day of discharge and determined appropriate for discharge.    Condition at Discharge: stable    Length of Discharge: I spent greater than 30 mins in the discharge of this patient.

## 2020-03-28 NOTE — Unmapped (Signed)
Problem: Adult Inpatient Plan of Care  Goal: Plan of Care Review  Outcome: Progressing  Goal: Patient-Specific Goal (Individualized)  Outcome: Progressing  Goal: Absence of Hospital-Acquired Illness or Injury  Outcome: Progressing  Intervention: Identify and Manage Fall Risk  Recent Flowsheet Documentation  Taken 03/28/2020 0800 by Fritzi Mandes, RN  Safety Interventions:   commode/urinal/bedpan at bedside   fall reduction program maintained   lighting adjusted for tasks/safety   low bed   nonskid shoes/slippers when out of bed   bed alarm  Intervention: Prevent Skin Injury  Recent Flowsheet Documentation  Taken 03/28/2020 0800 by Fritzi Mandes, RN  Skin Protection:   adhesive use limited   incontinence pads utilized  Intervention: Prevent and Manage VTE (Venous Thromboembolism) Risk  Recent Flowsheet Documentation  Taken 03/28/2020 0800 by Fritzi Mandes, RN  Activity Management: activity adjusted per tolerance  VTE Prevention/Management: anticoagulant therapy  Goal: Optimal Comfort and Wellbeing  Outcome: Progressing  Goal: Readiness for Transition of Care  Outcome: Progressing  Goal: Rounds/Family Conference  Outcome: Progressing     Problem: Impaired Wound Healing  Goal: Optimal Wound Healing  Outcome: Progressing  Intervention: Promote Wound Healing  Recent Flowsheet Documentation  Taken 03/28/2020 0800 by Fritzi Mandes, RN  Activity Management: activity adjusted per tolerance     Problem: Diabetes Comorbidity  Goal: Blood Glucose Level Within Targeted Range  Outcome: Progressing     Problem: Heart Failure Comorbidity  Goal: Maintenance of Heart Failure Symptom Control  Outcome: Progressing     Problem: Hypertension Comorbidity  Goal: Blood Pressure in Desired Range  Outcome: Progressing     Problem: Obstructive Sleep Apnea Risk or Actual Comorbidity Management  Goal: Unobstructed Breathing During Sleep  Outcome: Progressing     Problem: Fall Injury Risk  Goal: Absence of Fall and Fall-Related Injury  Outcome: Progressing  Intervention: Promote Injury-Free Environment  Recent Flowsheet Documentation  Taken 03/28/2020 0800 by Fritzi Mandes, RN  Safety Interventions:   commode/urinal/bedpan at bedside   fall reduction program maintained   lighting adjusted for tasks/safety   low bed   nonskid shoes/slippers when out of bed   bed alarm     VSS, patient in HD now before discharge this afternoon/evening. Will assess upon return to 3 right ear, otherwise uneventful thus far.

## 2020-03-28 NOTE — Unmapped (Signed)
[facility] Nephrology Hemodialysis Procedure Note     03/28/2020    Jeremy Oliver was seen and examined on hemodialysis    CHIEF COMPLAINT: End Stage Renal Disease    INTERVAL HISTORY: S/p cardiac cath yesterday.  LVEDP 34 mm Hg  He is s/p PCI with balloon-only angioplasty to LAD and RCA, Xience stent x 1 to DIAG.    He is 9kg above dry weight, will attempt 4L UF.      DIALYSIS TREATMENT DATA:  Estimated Dry Weight (kg): 155.5 kg (342 lb 13 oz)  Patient Goal Weight (kg): 4 kg (8 lb 13.1 oz)  Dialyzer: F-180 (98 mLs)  Dialysis Bath  Bath: 2 K+ / 2.5 Ca+  Dialysate Na (mEq/L): 137 mEq/L  Dialysate HCO3 (mEq/L): 35 mEq/L     Dialysis Flow (mL/min): 800 mL/min    PHYSICAL EXAM:  Vitals:  Temp:  [36.6 ??C (97.9 ??F)-36.8 ??C (98.2 ??F)] 36.6 ??C (97.9 ??F)  Heart Rate:  [74-91] 76  SpO2 Pulse:  [71] 71  BP: (134-185)/(64-97) 155/80  MAP (mmHg):  [81-122] 105  Weights:  Pre-Treatment Weight (kg): 164.5 kg (362 lb 10.5 oz)    General: Appearing in no acute distress  Pulmonary: normal  Cardiovascular: regular rate and rhythm  Extremities: trace  edema  Access: Right IJ tunneled catheter     LAB DATA:  Lab Results   Component Value Date    NA 132 (L) 03/28/2020    K 4.6 (H) 03/28/2020    CL 102 03/28/2020    CO2 22.0 03/28/2020    BUN 36 (H) 03/28/2020    CREATININE 6.00 (H) 03/28/2020      Lab Results   Component Value Date    HCT 33.9 (L) 03/28/2020    WBC 8.8 03/28/2020        ASSESSMENT/PLAN:  End Stage Renal Disease on Intermittent Hemodialysis:  - UF goal: 4L as tolerated  - Adjust medications for a GFR <10  - Avoid nephrotoxic agents     Bone Mineral Metabolism:  Lab Results   Component Value Date    CALCIUM 8.1 (L) 03/28/2020    CALCIUM 8.2 (L) 03/27/2020    Lab Results   Component Value Date    ALBUMIN 2.9 (L) 02/25/2020    ALBUMIN 2.8 (L) 02/23/2020      Lab Results   Component Value Date    PHOS 4.9 02/26/2020    PHOS 4.9 02/25/2020    Lab Results   Component Value Date    PTH 403.4 (H) 07/25/2019    PTH 395.4 (H) 04/14/2019 - Labs appropriate, no changes.  Continue phosphorus binder and dietary counseling.    Anemia:   Lab Results   Component Value Date    HGB 10.1 (L) 03/28/2020    HGB 11.4 (L) 03/27/2020    HGB 11.2 (L) 03/03/2020    Iron Saturation (%)   Date Value Ref Range Status   07/25/2019 11 (L) 20 - 50 % Final      Lab Results   Component Value Date    FERRITIN 150.0 07/25/2019       Anemia labs appropriate, no changes.    Tonye Royalty, MD  [facility] Neph[facility] Hypertension

## 2020-03-28 NOTE — Unmapped (Signed)
Mr. Okerlund  is a 49 year old male with extensive CAD history and most recent PCI to LM/LAD in late 12/2019. He has had some ongoing angina during dialysis sessions and saw Dr. Andrey Farmer last on 12/9, adjusting medications with addition of ranolazine. He has felt slightly better after this but still has similar sympoms at HD (last session yesterday). He says on Christmas (Saturday) he felt unwell for much of the day, threw up after having PM meds, and felt like his ICD shocked him then. He has felt better since this with no more episodes. He has been holding eliquis since Saturday, not missing any other meds. He presented on 03/27/20 for a LHC.

## 2020-03-28 NOTE — Unmapped (Signed)
He has remained A&Ox4 while awake. VSS. Denies pain. Right groin (cath access site) benign. Glycemia and HD therapies ongoing. Scheduled for HD today.     Problem: Adult Inpatient Plan of Care  Goal: Plan of Care Review  Outcome: Progressing  Goal: Patient-Specific Goal (Individualized)  Outcome: Progressing  Goal: Absence of Hospital-Acquired Illness or Injury  Outcome: Progressing  Intervention: Prevent and Manage VTE (Venous Thromboembolism) Risk  Recent Flowsheet Documentation  Taken 03/27/2020 2100 by Clancy Gourd, RN  Activity Management:   activity adjusted per tolerance   activity encouraged  VTE Prevention/Management: anticoagulant therapy  Goal: Optimal Comfort and Wellbeing  Outcome: Progressing  Goal: Readiness for Transition of Care  Outcome: Progressing  Goal: Rounds/Family Conference  Outcome: Progressing     Problem: Diabetes Comorbidity  Goal: Blood Glucose Level Within Targeted Range  Outcome: Progressing     Problem: Impaired Wound Healing  Goal: Optimal Wound Healing  Outcome: Progressing  Intervention: Promote Wound Healing  Recent Flowsheet Documentation  Taken 03/27/2020 2100 by Clancy Gourd, RN  Activity Management:   activity adjusted per tolerance   activity encouraged     Problem: Heart Failure Comorbidity  Goal: Maintenance of Heart Failure Symptom Control  Outcome: Progressing     Problem: Hypertension Comorbidity  Goal: Blood Pressure in Desired Range  Outcome: Progressing     Problem: Obstructive Sleep Apnea Risk or Actual Comorbidity Management  Goal: Unobstructed Breathing During Sleep  Outcome: Progressing     Problem: Fall Injury Risk  Goal: Absence of Fall and Fall-Related Injury  Outcome: Progressing

## 2020-03-29 ENCOUNTER — Ambulatory Visit: Admit: 2020-03-29 | Discharge: 2020-03-30 | Payer: MEDICARE

## 2020-03-29 MED ORDER — EZETIMIBE 10 MG TABLET
ORAL_TABLET | ORAL | 3 refills | 0.00000 days | Status: SS
Start: 2020-03-29 — End: 2021-03-29
  Filled 2020-04-03: qty 90, 90d supply, fill #0

## 2020-03-29 NOTE — Unmapped (Signed)
Problem: Adult Inpatient Plan of Care  Goal: Plan of Care Review  03/28/2020 1812 by Fritzi Mandes, RN  Outcome: Resolved  03/28/2020 1413 by Fritzi Mandes, RN  Outcome: Progressing  Goal: Patient-Specific Goal (Individualized)  03/28/2020 1812 by Fritzi Mandes, RN  Outcome: Resolved  03/28/2020 1413 by Fritzi Mandes, RN  Outcome: Progressing  Goal: Absence of Hospital-Acquired Illness or Injury  03/28/2020 1812 by Fritzi Mandes, RN  Outcome: Resolved  03/28/2020 1413 by Fritzi Mandes, RN  Outcome: Progressing  Intervention: Identify and Manage Fall Risk  Recent Flowsheet Documentation  Taken 03/28/2020 0800 by Fritzi Mandes, RN  Safety Interventions:   commode/urinal/bedpan at bedside   fall reduction program maintained   lighting adjusted for tasks/safety   low bed   nonskid shoes/slippers when out of bed   bed alarm  Intervention: Prevent Skin Injury  Recent Flowsheet Documentation  Taken 03/28/2020 0800 by Fritzi Mandes, RN  Skin Protection:   adhesive use limited   incontinence pads utilized  Intervention: Prevent and Manage VTE (Venous Thromboembolism) Risk  Recent Flowsheet Documentation  Taken 03/28/2020 0800 by Fritzi Mandes, RN  Activity Management: activity adjusted per tolerance  VTE Prevention/Management: anticoagulant therapy  Goal: Optimal Comfort and Wellbeing  03/28/2020 1812 by Fritzi Mandes, RN  Outcome: Resolved  03/28/2020 1413 by Fritzi Mandes, RN  Outcome: Progressing  Goal: Readiness for Transition of Care  03/28/2020 1812 by Fritzi Mandes, RN  Outcome: Resolved  03/28/2020 1413 by Fritzi Mandes, RN  Outcome: Progressing  Goal: Rounds/Family Conference  03/28/2020 1812 by Fritzi Mandes, RN  Outcome: Resolved  03/28/2020 1413 by Fritzi Mandes, RN  Outcome: Progressing     Problem: Impaired Wound Healing  Goal: Optimal Wound Healing  03/28/2020 1812 by Fritzi Mandes, RN  Outcome: Resolved  03/28/2020 1413 by Fritzi Mandes, RN  Outcome: Progressing  Intervention: Promote Wound Healing  Recent Flowsheet Documentation  Taken 03/28/2020 0800 by Fritzi Mandes, RN  Activity Management: activity adjusted per tolerance     Problem: Diabetes Comorbidity  Goal: Blood Glucose Level Within Targeted Range  03/28/2020 1812 by Fritzi Mandes, RN  Outcome: Resolved  03/28/2020 1413 by Fritzi Mandes, RN  Outcome: Progressing     Problem: Heart Failure Comorbidity  Goal: Maintenance of Heart Failure Symptom Control  03/28/2020 1812 by Fritzi Mandes, RN  Outcome: Resolved  03/28/2020 1413 by Fritzi Mandes, RN  Outcome: Progressing     Problem: Hypertension Comorbidity  Goal: Blood Pressure in Desired Range  03/28/2020 1812 by Fritzi Mandes, RN  Outcome: Resolved  03/28/2020 1413 by Fritzi Mandes, RN  Outcome: Progressing     Problem: Obstructive Sleep Apnea Risk or Actual Comorbidity Management  Goal: Unobstructed Breathing During Sleep  03/28/2020 1812 by Fritzi Mandes, RN  Outcome: Resolved  03/28/2020 1413 by Fritzi Mandes, RN  Outcome: Progressing     Problem: Fall Injury Risk  Goal: Absence of Fall and Fall-Related Injury  03/28/2020 1812 by Fritzi Mandes, RN  Outcome: Resolved  03/28/2020 1413 by Fritzi Mandes, RN  Outcome: Progressing  Intervention: Promote Injury-Free Environment  Recent Flowsheet Documentation  Taken 03/28/2020 0800 by Fritzi Mandes, RN  Safety Interventions:   commode/urinal/bedpan at bedside   fall reduction program maintained   lighting adjusted for tasks/safety   low bed   nonskid shoes/slippers when out of bed   bed alarm  VSS, patient being discharged. Discharge information provided to and reviewed with patient at bedside, all questions/concerns addressed at this time, no further questions/concerns. Telemetry removed, PIV access removed, transportation requested, family member en route for pickup.

## 2020-03-29 NOTE — Unmapped (Signed)
HEMODIALYSIS NURSE PROCEDURE NOTE       Treatment Number:  1 Room / Station:  4    Procedure Date:  04-12-2020 Device Name/Number: [place]    Total Dialysis Treatment Time:  138 Min.    CONSENT:    Written consent was obtained prior to the procedure and is detailed in the medical record.  Prior to the start of the procedure, a time out was taken and the identity of the patient was confirmed via name, medical record number and date of birth.     WEIGHT:  Hemodialysis Pre-Treatment Weights     Date/Time Pre-Treatment Weight (kg) Estimated Dry Weight (kg) Patient Goal Weight (kg) Total Goal Weight (kg)    04-12-2020 1340 164.5 kg (362 lb 10.5 oz) 155.5 kg (342 lb 13 oz) 4 kg (8 lb 13.1 oz) 4.55 kg (10 lb 0.5 oz)         Hemodialysis Post Treatment Weights     Date/Time Post-Treatment Weight (kg) Treatment Weight Change (kg)    04/12/2020 1557 ???  UTW ???        Active Dialysis Orders (168h ago, onward)     Start     Ordered    Apr 12, 2020 1043  Hemodialysis inpatient  Every Mon, Wed, Fri      Question Answer Comment   K+ 2 meq/L    Ca++ 2.5 meq/L    Bicarb 35 meq/L    Na+ 137 meq/L    Na+ Modeling No    Dialyzer [id number]    Dialysate Temperature (C) 37    BFR-As tolerated to a maximum of: 400 mL/min    DFR 800 mL/min    Duration of treatment 4 Hr    Dry weight (kg) 155.5    Challenge dry weight (kg) no    Fluid removal (L) 4L as tolerated    Tubing Adult = 142 ml    Access Site Dialysis Catheter    Access Site Location Right        2020-04-12 1042              ASSESSMENT:  General appearance: alert  Neurologic: Grossly normal  Lungs: clear to auscultation bilaterally  Heart: regular rate and rhythm, S1, S2 normal, no murmur, click, rub or gallop  Abdomen: soft, non-tender; bowel sounds normal; no masses,  no organomegaly  Pulses: 2+ and symmetric.  Skin: Skin color, texture, turgor normal. No rashes or lesions    ACCESS SITE:       Hemodialysis Catheter 07/27/19 Right Internal jugular 2.2 mL 2.3 mL (Active)   Site Assessment Clean;Dry;Intact 04-12-2020 1800   Proximal Lumen Status / Patency Capped 04-12-2020 1800   Proximal Lumen Intervention Capped - dead end 2020-04-12 1800   Medial Lumen Status / Patency Capped 2020-04-12 1800   Medial Lumen Intervention Capped - dead end April 12, 2020 1800   Dressing Intervention No intervention needed 04/12/20 1800   Dressing Status      Clean;Dry;Changed Apr 12, 2020 1800   Verification by X-ray No 02/26/20 1200   Site Condition No complications 02/26/20 1200   Dressing Type CHG gel;Occlusive;Transparent 2020-04-12 1800   Dressing Change Due 04/04/20 2020-04-12 1600   Line Necessity Reviewed? Y April 12, 2020 1600   Line Necessity Indications Yes - Hemodialysis 04-12-20 1600   Line Necessity Reviewed With nephrology 2020-04-12 1600           Catheter fill volumes:    Arterial: 2.0 mL Venous: 2.1 mL   Catheter filled with gentamycin citrate post procedure.  Patient Lines/Drains/Airways Status     Active Peripheral & Central Intravenous Access     None               LAB RESULTS:  Lab Results   Component Value Date    NA 132 (L) 03/28/2020    K 4.6 (H) 03/28/2020    CL 102 03/28/2020    CO2 22.0 03/28/2020    BUN 36 (H) 03/28/2020    CREATININE 6.00 (H) 03/28/2020    GLU 215 (H) 03/28/2020    GLUF 223 (H) 06/09/2014    CALCIUM 8.1 (L) 03/28/2020    CAION 4.31 (L) 02/25/2020    PHOS 4.9 02/26/2020    MG 1.6 03/03/2020    PTH 403.4 (H) 07/25/2019    IRON 32 (L) 07/25/2019    LABIRON 11 (L) 07/25/2019    TRANSFERRIN 223.8 07/25/2019    FERRITIN 150.0 07/25/2019    TIBC 282.0 07/25/2019     Lab Results   Component Value Date    WBC 8.8 03/28/2020    HGB 10.1 (L) 03/28/2020    HCT 33.9 (L) 03/28/2020    PLT 221 03/28/2020    PHART 7.31 (L) 04/07/2017    PO2ART 124.0 (H) 04/07/2017    PCO2ART 44.3 04/07/2017    HCO3ART 22 04/07/2017    BEART -3.4 (L) 04/07/2017    O2SATART 98.9 04/07/2017    APTT 53.3 (H) 02/26/2020        VITAL SIGNS:     Hemodynamics     Date/Time Pulse BP MAP (mmHg) Patient Position    03/28/20 16:08:18 67 143/70 ??? ???    03/28/20 16:00:02 61 131/69 ??? ???    03/28/20 1600 78 131/69 ??? ???    03/28/20 1530 78 160/80 ??? ???    03/28/20 1500 76 168/86 ??? ???    03/28/20 1430 78 171/79 ??? ???        Blood Volume Monitor     Date/Time Blood Volume Change (%) HCT HGB Critline O2 SAT %    03/28/20 1530 -5.1 % 29.9 10.2 63.9        Oxygen Therapy     Date/Time Resp SpO2 O2 Device O2 Flow Rate (L/min)    03/28/20 16:05:19 ??? 99 %  2 L [place] ??? ???    03/28/20 1530 18 ??? ??? ???    03/28/20 1500 18 ??? ??? ???    03/28/20 1430 18 ??? ??? ???          Pre-Hemodialysis Assessment     Date/Time Therapy Number Dialyzer Hemodialysis Line Type All Machine Alarms Passed    03/28/20 1340 1 F-180 (98 mLs) Adult (142 m/s) Yes    Date/Time Air Detector Saline Line Double Clampled Hemo-Safe Applied Dialysis Flow (mL/min)    03/28/20 1340 Engaged ??? ??? 800 mL/min    Date/Time Verify Priming Solution Priming Volume Hemodialysis Independent pH Hemodialysis Machine Conductivity (mS/cm)    03/28/20 1340 0.9% NS 300 mL ???  N/A 13.8 mS/cm    Date/Time Hemodialysis Independent Conductivity (mS/cm) Bicarb Conductivity Residual Bleach Negative Total Chlorine    03/28/20 1340 13.6 mS/cm -- Yes 0        Pre-Hemodialysis Treatment Comments     Date/Time Pre-Hemodialysis Comments    03/28/20 1340 VSS- pt stable for HD        Hemodialysis Treatment     Date/Time Blood Flow Rate (mL/min) Arterial Pressure (mmHg) Venous Pressure (mmHg) Transmembrane Pressure (mmHg)    03/28/20 1600 0  mL/min 44 mmHg ??? 40 mmHg    03/28/20 1530 400 mL/min -182 mmHg 174 mmHg 52 mmHg    03/28/20 1500 400 mL/min -172 mmHg 165 mmHg 54 mmHg    03/28/20 1430 400 mL/min -162 mmHg 162 mmHg 50 mmHg    03/28/20 1400 400 mL/min -156 mmHg 154 mmHg 54 mmHg    03/28/20 1342 400 mL/min -140 mmHg 120 mmHg 50 mmHg    Date/Time Ultrafiltration Rate (mL/hr) Ultrafiltrate Removed (mL) Dialysate Flow Rate (mL/min) KECN (Kecn)    03/28/20 1600 0 mL/hr 2285 mL 800 ml/min ???    03/28/20 1530 1110 mL/hr 1848 mL 800 ml/min ???    03/28/20 1500 1110 mL/hr 1288 mL 800 ml/min ???    03/28/20 1430 1110 mL/hr 744 mL 800 ml/min ???    03/28/20 1400 1110 mL/hr 184 mL 800 ml/min ???    03/28/20 1342 1110 mL/hr 0 mL 800 ml/min ???        Hemodialysis Treatment Comments     Date/Time Intra-Hemodialysis Comments    03/28/20 1600 ???  rapid response called, tx terminiated immediately- see notes    03/28/20 1530 VSS    03/28/20 1500 VSS, pt sleeping    03/28/20 1430 VSS, pt sleeping    03/28/20 1400 VSS    03/28/20 1342 VSS, tx started        Post Treatment     Date/Time Rinseback Volume (mL) On Line Clearance: spKt/V Total Liters Processed (L/min) Dialyzer Clearance    03/28/20 1557 300 mL ??? 42 L/min Moderately streaked        Post Hemodialysis Treatment Comments     Date/Time Post-Hemodialysis Comments    03/28/20 1557 VSS upon transfer back to floor, NAD        Hemodialysis I/O     Date/Time Total Hemodialysis Replacement Volume (mL) Total Ultrafiltrate Output (mL)    03/28/20 1557 ??? 2000 mL          [id number] - Medicaitons Given During Treatment  (last 5 hrs)         DAVID W DROGOS, RN       Medication Name Action Time Action Route Rate Dose User     insulin lispro (HumaLOG) injection 0-12 Units 03/28/20 1741 Given Subcutaneous  4 Units Fritzi Mandes, RN     insulin lispro (HumaLOG) injection 15 Units 03/28/20 1742 Given Subcutaneous  15 Units Fritzi Mandes, RN     sevelamer (RENVELA) tablet 2,400 mg 03/28/20 1742 Given Oral  2,400 mg Fritzi Mandes, RN          Micheal Likens, RN       Medication Name Action Time Action Route Rate Dose User     acetaminophen (TYLENOL) tablet 650 mg 03/28/20 1634 Given Oral  650 mg Micheal Likens, RN                  Patient tolerated treatment in a  Dialysis Recliner.

## 2020-03-29 NOTE — Unmapped (Signed)
Pt arrived to unit for 4 hour tx prior to discharge home.  VSS upon arrival.  Pt alert and oriented, ambulates self from wheelchair to dialysis chair.  Tx initiated per order.  Meds given per order.  CVC accessed without difficulty.        Problem: Device-Related Complication Risk (Hemodialysis)  Goal: Safe, Effective Therapy Delivery  Outcome: Ongoing - Unchanged     Problem: Hemodynamic Instability (Hemodialysis)  Goal: Effective Tissue Perfusion  Outcome: Ongoing - Unchanged     Problem: Infection (Hemodialysis)  Goal: Absence of Infection Signs and Symptoms  Outcome: Ongoing - Unchanged

## 2020-04-03 MED FILL — ALLOPURINOL 100 MG TABLET: 90 days supply | Qty: 180 | Fill #0 | Status: AC

## 2020-04-03 MED FILL — DICLOFENAC 1 % TOPICAL GEL: 34 days supply | Qty: 200 | Fill #2 | Status: AC

## 2020-04-03 MED FILL — GABAPENTIN 300 MG CAPSULE: 90 days supply | Qty: 180 | Fill #0 | Status: AC

## 2020-04-03 MED FILL — ELIQUIS 5 MG TABLET: 30 days supply | Qty: 60 | Fill #7 | Status: AC

## 2020-04-03 MED FILL — METOPROLOL SUCCINATE ER 100 MG TABLET,EXTENDED RELEASE 24 HR: ORAL | 15 days supply | Qty: 15 | Fill #2

## 2020-04-03 MED FILL — BUMETANIDE 2 MG TABLET: 54 days supply | Qty: 108 | Fill #3 | Status: AC

## 2020-04-03 MED FILL — OZEMPIC 0.25 MG OR 0.5 MG (2 MG/1.5 ML) SUBCUTANEOUS PEN INJECTOR: 84 days supply | Qty: 4 | Fill #1 | Status: AC

## 2020-04-03 MED FILL — OZEMPIC 0.25 MG OR 0.5 MG (2 MG/1.5 ML) SUBCUTANEOUS PEN INJECTOR: SUBCUTANEOUS | 84 days supply | Qty: 4.5 | Fill #1

## 2020-04-03 MED FILL — RANOLAZINE ER 500 MG TABLET,EXTENDED RELEASE,12 HR: ORAL | 30 days supply | Qty: 60 | Fill #1

## 2020-04-03 MED FILL — BUMETANIDE 2 MG TABLET: ORAL | 54 days supply | Qty: 108 | Fill #3

## 2020-04-03 MED FILL — RANOLAZINE ER 500 MG TABLET,EXTENDED RELEASE,12 HR: 26 days supply | Qty: 52 | Fill #1 | Status: AC

## 2020-04-03 MED FILL — NITROGLYCERIN 400 MCG/SPRAY TRANSLINGUAL: SUBLINGUAL | 10 days supply | Qty: 12 | Fill #2

## 2020-04-03 MED FILL — EZETIMIBE 10 MG TABLET: 90 days supply | Qty: 90 | Fill #0 | Status: AC

## 2020-04-03 MED FILL — NITROGLYCERIN 400 MCG/SPRAY TRANSLINGUAL: 10 days supply | Qty: 12 | Fill #2 | Status: AC

## 2020-04-03 MED FILL — ELIQUIS 5 MG TABLET: ORAL | 30 days supply | Qty: 60 | Fill #7

## 2020-04-03 MED FILL — DICLOFENAC 1 % TOPICAL GEL: TOPICAL | 34 days supply | Qty: 200 | Fill #2

## 2020-04-03 MED FILL — VENTOLIN HFA 90 MCG/ACTUATION AEROSOL INHALER: 25 days supply | Qty: 18 | Fill #0 | Status: AC

## 2020-04-03 MED FILL — MIRTAZAPINE 7.5 MG TABLET: 30 days supply | Qty: 30 | Fill #0 | Status: AC

## 2020-04-03 MED FILL — METOPROLOL SUCCINATE ER 100 MG TABLET,EXTENDED RELEASE 24 HR: 15 days supply | Qty: 15 | Fill #2 | Status: AC

## 2020-04-06 NOTE — Unmapped (Signed)
Patient was a no show for vascular access clinic today. Sent letter by mail and notified dialysis center and [facility]).  Administrative Coordinator will attempt one more call.

## 2020-04-08 NOTE — Unmapped (Signed)
Received message from Azell Der that patient's last EF 15% and cardiology did not recommend pursuing vascular access at this time. Will wait to hear updates from Ms. Ilsa Iha regarding future scheduling.

## 2020-04-09 NOTE — Unmapped (Signed)
Internal Medicine Phone Visit    This visit is conducted via telephone.    Contact Information  Person Contacted: Patient  Contact Phone number: [phone] (home)   Is there someone else in the room? No.   Patient agreed to a phone visit    Mr. Breiner is a 50 y.o. male  participating in a telephone encounter.    Reason for Call:  Hospital follow up for CAD, chest pain    Subjective:  Able to tolerate dialysis with stable BP now with new medications.  He reports feeling Numbness around mouth since leaving the hospital.  He Larey Seat when it was raining, slipped and hit lower back. Having back spasms and his back brace is old and needs a new one.    Silky taste in month new since discharge, worse after meds. Favorite foods with no taste, like cabbage, but can taste other foods    Family member with covid, pharmacist in [place] has booster for him and he got a call today.  He has a suit with breathing apparatus that he's going to use if he is around his family member. He used to be a Engineer, water    I have reviewed the problem list, medications, and allergies and have updated/reconciled them if needed.    Objective:  Alert and oriented, NAD     Assessment & Plan:  1. Fall, initial encounter    2. Back pain, unspecified back location, unspecified back pain laterality, unspecified chronicity    3. Coronary artery disease of native artery of native heart with stable angina pectoris (CMS-[name])           Hospital follow-up for CAD, chest pain, difficult tolerating HD  -feeling better, no further CP or syncope, tolerating HD better  -continue current meds    Fell when slipped outside, back pain  -previously had a back brace but it is old and needs a new one.   -sent order to pharmacy  -Encouraged to follow-up with PMR about power scooter    Change in taste  -had COVID test in hospital, but discussed that the decrease in taste could be a COVID symptom, he reports getting tested at HD regularly  -he has a family member with COVID  -recommended COVID testing, and that he get the booster if his test is negative, as he has only had one J and J vaccine    Numbness around mouth  -unsure of etiology of this, without an exam    Depression  -PHQ9= 13, negative for SI/HI. Plans to schedule [facility] appointment with [facility]      No follow-ups on file.           The patient reports they are currently: at home. I spent 9 minutes on the phone with the patient on the date of service. I spent an additional 10 minutes on pre- and post-visit activities on the date of service.     The patient was physically located in [place] or a state in which I am permitted to provide care. The patient and/or parent/guardian understood that s/he [place] incur co-pays and cost sharing, and agreed to the telemedicine visit. The visit was reasonable and appropriate under the circumstances given the patient's presentation at the time.    The patient and/or parent/guardian has been advised of the potential risks and limitations of this mode of treatment (including, but not limited to, the absence of in-person examination) and has agreed to be treated using telemedicine. The patient's/patient's family's  questions regarding telemedicine have been answered.     If the visit was completed in an ambulatory setting, the patient and/or parent/guardian has also been advised to contact their provider???s office for worsening conditions, and seek emergency medical treatment and/or call 911 if the patient deems either necessary.    SUMMARY OF ASSOCIATED HOSPITALIZATION    Admit Date: 03/27/2020  ??  Discharge Date: 03/28/2020   ??  Discharge Diagnoses:  Principal Diagnosis:   Coronary artery disease involving native coronary artery of native heart without angina pectoris  ??  Secondary Diagnoses:  Chest Pain  HFrEF, Chronic  Mitral Regurgitation  Paroxysmal Atrial Fibrillation  VT  Hypertension  Hyperlipidemia  Diabetes Mellitus Type 2  OSA  End Stage Renal Disease  Stroke  ??  Principal Problem:    Coronary artery disease of native artery of native heart with stable angina pectoris (CMS-[name]) POA: Unknown  ??  Hospital Course:   Mr. Coltrain  is a??50 year old male with extensive CAD history and most recent PCI to LM/LAD in late 12/2019. He has had some ongoing angina during dialysis sessions and saw Dr. Andrey Farmer last on 12/9, adjusting medications with addition of ranolazine. He has felt slightly better after this but still has similar sympoms at HD (last session yesterday). He says on Christmas (Saturday) he felt unwell for much of the day, threw up after having PM meds, and felt like his ICD shocked him then. He has felt better since this with no more episodes. He has been holding eliquis since Saturday, not missing any other meds. He presented on 03/27/20 for a LHC.     ??  CAD/Angina: Patient has extensive CAD. Underwent PCI on October this year to the LMCA and LAD. Has been having on angina during dialysis since that time.  ??  Continue:  -Ticagrelor 90 mg BID  -Atorvastatin 80 mg nightly  -Zetia 10mg  daily   -Isosorbide 60mg  (Tues-Thur-Sat)  ??  -LHC 03/27/20--Right Femoral access  Right groin without hematoma or bleeding. Dressing C/D/I  ??  Finding:??  Severe 2v CAD including 95% LAD/DIAG stenosis, 80% distal RCA  LVEDP 34 mm Hg  -PCI with balloon-only angioplasty to LAD and RCA, Xience stent x 1 to DIAG??  -Follow up appt on 05/18/19 with Dr. Andrey Farmer   -Referral to cardiac rehab made

## 2020-04-09 NOTE — Unmapped (Signed)
HOSPITAL FOLLOW UP PRE-VISIT ASSESSMENT:     Clinical Social Worker (SW) contacted patient via phone to follow up on their recent hospitalization.      Is there someone else in the room? Yes. What is your relationship? Spouse, [name]. Do you want this person here for the visit? Yes.     Confirmed day and time of appointment with patient is:  04/09/20 @ 1:20pm  Patient reminded to bring all medications to appt.   -Declines to bring because he takes so many.   -Reports spouse helps with medication management; fills his pillbox and ensures he takes as prescribed.    Symptom Concerns:  ?? Reports issues with numbness around mouth/silkiness in mouth. Thinks it might be one of his medications causing it.   ?? Fell last night, slipped on the steps during rain.   -Hit head and back; did not lose consciousness.   -Had mild headache s/p fall. Took tylenol and felt better.  -Feels head is alright, but back hurts.   -Missed dialysis today because I couldn't move due to back pain.  ?? Patient was encouraged to discuss symptom concerns with HFU Providers.    Transportation:  ?? Do you have difficulty getting to your medical appointments: No.     Health Literacy:   ?? How often do you have someone help you when you read instructions, pamphlets or written material from your doctor or pharmacy: Always - States I don't read well. Has wife review all medical instructions with him.    Discharge Home Health [facility]) Care and Durable Medical Equipment (DME) Status and Need:   HH:   ?? does not have home health services.    ?? does not have personal care services/personal aide support and denies need for support at this time.     DME:   ?? Patient reports use of rollator, shower chair, wheelchair and BIPAP.  Also has BSC and hospital bed for use as needed.   ?? Denies need for repair on existing equipment  ?? reports need for power scooter.   -Reports he had evaluation for power scooter at [facility]   -[employer] approved it, but haven't heard about delivery of DME.   -Patient encouraged to follow up with [facility] [phone]). Contact info added to HFU AVS.    ?? Reports need for back brace.   -Requesting Provider send to [facility].  -SW advised that dme likely will not be covered by insurance if obtained from a pharmacy vs. a medical supply company. If Provider agrees to place order for back brace, can likely get covered if obtained from [facility].   -Patient voiced he wants to try obtaining from [facility] first and will reach out for assistance if dme is not covered.      Housing and Aftercare Support:    ?? Patient lives with their spouse in a house that is located within a trailer park in [place].   ?? Trailer park was sold to new management starting in February. Through the [facility], rent and electricity have been paid through December of 2022. Voiced some concern they could lose current housing. Must at least give tenants 90 day notice.  ?? He denies issues with home safety.    ?? The patient receives support from his wife and children.      Mental Health:   PHQ-2 Score:  PHQ-2 Total Score : 3  PHQ-9 Score:  PHQ-9 TOTAL SCORE: 13  Negative for SI and HI.   -Reports mood has been changing. Getting a little ill.   -States I'll go off the hook more often lately. More irritable lately towards everyone.  -Reports his cardiologist told him he has 1.5 years to live. States God is going to take care of me.  -Worried if he dies, is wife going to be able to take care of herself?  -One daughter just re-entered his life within the last 6 months.  -Wants to have a family meeting to discuss end of life planning  Do you have counseling support? Yes, followed by LCSW Anda Latina w/ [facility].  Would you like counseling resource information?  N/A   -Plan for follow-up appointment with LCSW Diane Dolan-Soto  ?? Patient encouraged to call the Internal Medicine Clinic at [phone] to schedule.  ?? Patient voiced preference to ask the front desk staff to help him schedule when on-site tomorrow. SW advised that front desk staff no longer assists with appt scheduling. Patient voiced plan to try.    Substance Use:   ?? Alcohol   ? How much alcohol do you typically drink in a week? 0=Never  ? How many drinks do you have on a typical day when drinking alcohol? 0  ? How often do you have 5 of more drinks on one occasion? 0=Never    ?? Other Substances   ? In the past year how often have you taken non-prescribed prescription medications? Never  ? In the past year how often have you used substances such as marijuana, cocaine, heroin, PCP, ecstacy, etc) Never    ?? Tobacco   ? When was the last time you used a tobacco product such as cigarettes, cigars, cigarillos, smokeless tobacco, vape or e-cigarettes? smokeless tobacco  ? Tobacco Product: dip  ? Amount: 3 per day  ? Working on cessation; down from 1 can per day to 3 dips per day. Hopes to quit within 5 months. Not using any NRT products at this time.  Tried gum, lozenges, patches in past. Did not think they helped. Plan to continue efforts on his own.       ------------------------------------------------------------------------------------------------------------------------------------------   Note routed to Forde Radon, PharmD, CPP and Clyda Hurdle, MD, PhD who will follow up w/patient during their HFU visit.      Family, social and cultural characteristics were assessed during the visit and plan.     Patient was informed they can call the clinic at [phone] with any additional questions or concerns.

## 2020-04-10 ENCOUNTER — Institutional Professional Consult (permissible substitution): Admit: 2020-04-10 | Discharge: 2020-04-11 | Payer: MEDICARE

## 2020-04-10 DIAGNOSIS — W19XXXA Unspecified fall, initial encounter: Principal | ICD-10-CM

## 2020-04-10 DIAGNOSIS — M549 Dorsalgia, unspecified: Principal | ICD-10-CM

## 2020-04-10 DIAGNOSIS — I25118 Atherosclerotic heart disease of native coronary artery with other forms of angina pectoris: Principal | ICD-10-CM

## 2020-04-10 MED ORDER — BACK BRACE
Freq: Every day | 0 refills | 0.00000 days | Status: SS | PRN
Start: 2020-04-10 — End: 2020-07-18

## 2020-04-10 NOTE — Unmapped (Signed)
Things to do:  ?? To schedule a follow-up counseling appointment with Anda Latina, LCSW, please call the main clinic line at [phone].  ?? To follow up on the status of your power scooter, contact [facility] at [phone].

## 2020-04-10 NOTE — Unmapped (Signed)
REASON FOR VISIT: Hospital Follow-up Medication Management    ASSESSMENT AND PLAN:  #CAD  -Ticagrelor 90 mg BID  -Atorvastatin 80 mg nightly  -Zetia 10mg  daily   -Isosorbide 60mg  (Tues-Thur-Sat)  - metoprolol XL 25mg  daily (changed from non-dialysis days during admission), HR and BP not available during call  - ranolazine 500mg  BID  - no changes recommended    #afib  - Eliquis 5mg  BID, is on GI protection. Consider trough level given HD and wt 160kg. Defer to PCP.  - Amiodarone 200mg  PO daily    #HFrEF  - metoprolol XL 25mg  daily (changed from non-dialysis days during admission), BP & HR not available today  - Jardiance STOPPED during admission due to contraindication with hemodialysis; presumably not on ACEi/ARB or AA due to ESRD    #DM  Lab Results   Component Value Date    A1C 7.2 (H) 04/14/2019   - Novolog 15 units TID  - Levemir 30 units nightly  - Ozempic 0.5mg  q7days  - Jardiance STOPPED during admission    #neuropathy  - gabapentin 300mg  BID    #gout  Lab Results   Component Value Date    URICACID 9.4 (H) 06/27/2019   - allopurinol 200mg  daily  - consider recheck of uric acid to guide allopurinol dose    #ESRD on HD [name]  - Sevelamer 2400mg  TID  - medications will need to continue to be dosed for ESRD on HD    #anemia of chronic disease  - not on an ESA  Lab Results   Component Value Date    HGB 10.1 (L) 03/28/2020     #anxiety/depression  - mirtazapine 7.5mg  daily  - fluoxetine 80mg  daily (max dose)    #Immunization  - discussed recommendation for MRNA COVID19 immunization to follow J&J original immunization    #Medication Management  - Medications prescribed or ordered upon discharge were reviewed today and reconciled with the most recent outpatient medication list.  Medication reconciliation was conducted by a prescribing practitioner, or clinical pharmacist.   - Reviewed the indication, dose, and frequency of each medication with patient    Recommendations and medication-related problems were discussed directly with Dr. Rose Fillers who will see Jeremy Oliver immediately following this visit. Updated medication list and medication changes will be provided to the patient as a printed after visit summary.    HISTORY OF PRESENT ILLNESS:   Jeremy Oliver is a 50 y.o. year old male with extensive CAD history and most recent PCI to LM/LAD in late 12/2019, HfrEF, afib, VT, HTN, HLD, type 2 DM, ESRD on HD, hx of stroke, OSA who was recently hospitalized for [facility] now s/p DES and balloon only angioplasty,     Today's visit is done by phone due to lack of transportation. Wife manages medications. When I asked to review them she started shouting on the call saying I am not going to go through all his meds at every visit. She insists that he is taking everything as listed in our record, but is not willing to go through meds specifically.    Jeremy Oliver confirmed he increased metoprolol XL to 25mg  daily and has not needed any SL NTG    #DM  Reported regimen: Levemir 35 units at night, Novolog 15 units TID with meals. Stopped Jardiance  BG checks 2 hours after eating: 152-200 per pt memory  No hypoglycemia    #Covid immunization  - has not received booster vaccine    Jeremy Oliver does not  need refills today.    Spouse manages medications at home.     Patient does not have to do without their medications because of cost.     DISCREPANCIES NOTED DURING MED REVIEW:  1.   Levemir 35 units not 30 units nightly    MEDICATIONS AND ALLERGIES:   Reviewed and updated in EPIC      ___________________________________________________   Jeanella Flattery, PharmD, CPP

## 2020-04-10 NOTE — Unmapped (Signed)
[facility] Internal Medicine   CARE MANAGEMENT ENCOUNTER           Date of Service:  04/10/2020      Service:  Care Coordination - phone  MyChart use by patient is active: yes    Purpose of contact:   DME  Care Manager (CM) received request from [facility] Provider, Clyda Hurdle, MD, to assist with order for back brace.    Care Manager (CM) completed the following related to the above request:  ??? Completed call with [facility] (P: [phone])  o Confirmed pharmacy is unable to process back brace order with insurance.  o Have an adjustable back brace patient can purchase 51-inch out-of-pocket for $27.11.  o Order will need to be sent to DME supplier in order to process with insurance.  o  [employer], Yasmin, spoke with patient today and notified of the above.    ??? Completed [facility] (P: [phone])  o Staff Randell Loop reviewed order and confirmed they can process with insurance.  o Will contact Provider if any further information is needed.  o Alana to call patient to schedule appointment for back brace fitting.    ??? Left voicemail for patient notifying of the above and to expect call from [facility] to schedule fitting for back brace.    Patient was encouraged to reach out to their provider with any questions or concerns.    A copy of Care Management Encounter note/information was sent to provider

## 2020-04-10 NOTE — Unmapped (Signed)
[facility] at [facility]       Type of visit:  telephone    Reason for visit: hospital follow up    Questions / Concerns that need to be addressed:     General Consent to Treat (GCT) for non-epic video visits only: Verbal consent            Allergies reviewed: Yes    Medication reviewed: Yes  Pended refills? No        [name] reviewed and updated in Epic:    We are working to make sure all of our patients??? wishes are updated in Epic and part of that is documenting a Environmental health practitioner for each patient  A Health Care Decision Rodena Piety is someone you choose who can make health care decisions for you if you are not able ??? who would you most want to do this for you????  is already up to date.              COVID-19 Vaccine Summary  Which COVID-19 Vaccine was administered  Janssen  Type:  Janssen-Complete  Dates Given:  09/06/2019                     Immunization History   Administered Date(s) Administered   ??? COVID-19 VACC,(JANSSEN)(PF)(IM) 09/06/2019   ??? INFLUENZA TIV (TRI) PF (IM) 12/09/2007, 12/27/2008, 01/09/2011   ??? Influenza Vaccine Quad (IIV4 PF) 54mo+ injectable 01/09/2012, 12/06/2012, 03/02/2014, 12/21/2014, 12/05/2015, 12/09/2016, 12/04/2017, 12/10/2018   ??? Influenza Virus Vaccine, unspecified formulation 01/06/2020   ??? Novel Influenza-h1n1-09, All Formulations 03/13/2008   ??? PNEUMOCOCCAL POLYSACCHARIDE 23 02/10/2007   ??? TdaP 08/30/2009       __________________________________________________________________________________________

## 2020-04-11 MED ORDER — LOPERAMIDE 2 MG CAPSULE
ORAL_CAPSULE | 2 refills | 0 days
Start: 2020-04-11 — End: ?

## 2020-04-11 NOTE — Unmapped (Signed)
[facility] Clinical Assessment & Refill Coordination Note    Jeremy Oliver, DOB: 1971-01-12  Phone: [phone] (home)     All above HIPAA information was verified with patient's family member, wife.     Was a Nurse, learning disability used for this call? No    Specialty Medication(s):   General Specialty: Praluent     Current Outpatient Medications   Medication Sig Dispense Refill   ??? acetaminophen (TYLENOL) 500 MG tablet TAKE 2 TABLETS (1000MG ) BY MOUTH EVERY 8 HOURS AS NEEDED FOR PAIN 180 tablet 0   ??? albuterol HFA 90 mcg/actuation inhaler Inhale 2 puffs into the lungs every 6 (six) hours as needed for wheezing or shortness of breath. 18 g 2   ??? alirocumab (PRALUENT) 150 mg/mL subcutaneous injection Inject the contents of 1 pen (150 mg total) under the skin every fourteen (14) days. 6 mL 3   ??? allopurinoL (ZYLOPRIM) 100 MG tablet Take 2 tablets (200 mg total) by mouth daily. 180 tablet 0   ??? amiodarone (PACERONE) 200 MG tablet TAKE 1 TABLET BY MOUTH ONCE DAILY 90 tablet 3   ??? apixaban (ELIQUIS) 5 mg Tab Take 1 tablet (5 mg total) by mouth Two (2) times a day. 60 tablet 11   ??? atorvastatin (LIPITOR) 80 MG tablet Take 1 tablet (80 mg total) by mouth daily. 90 tablet 3   ??? back brace Misc 1 each by Miscellaneous route daily as needed. 1 each 0   ??? blood sugar diagnostic Strp Use as directed to check blood glucose Four (4) times a day (before meals and nightly). 300 each 3   ??? blood-glucose meter kit Disp. blood glucose meter kit preferred by patient's insurance. Dx: Diabetes, E11.9 1 each 11   ??? bumetanide (BUMEX) 2 MG tablet Take 1 tablet (2 mg total) by mouth two (2) times a day. On non dialysis days 180 tablet 3   ??? cholecalciferol, vitamin D3 25 mcg, 1,000 units,, 1,000 unit (25 mcg) tablet Take 1 tablet (25 mcg total) by mouth daily. 100 tablet 11   ??? diclofenac sodium (VOLTAREN) 1 % gel Apply 2 grams topically Three (3) times a day. 180 g 11   ??? empty container (SHARPS-A-GATOR DISPOSAL SYSTEM) Misc Use as directed for sharps disposal 1 each 2   ??? ezetimibe (ZETIA) 10 mg tablet TAKE 1 TABLET BY MOUTH ONCE DAILY 90 tablet 3   ??? FLUoxetine (PROZAC) 40 MG capsule Take 2 capsules (80 mg total) by mouth daily. 180 capsule 0   ??? gabapentin (NEURONTIN) 300 MG capsule Take 1 capsule (300 mg total) by mouth two (2) times a day. 180 capsule 2   ??? insulin ASPART (NOVOLOG FLEXPEN U-100 INSULIN) 100 unit/mL (3 mL) injection pen Inject 15 Units under the skin Three (3) times a day before meals. 45 mL 3   ??? insulin detemir U-100 (LEVEMIR FLEXTOUCH U-100 INSULN) 100 unit/mL (3 mL) injection pen Inject 30 units under the skin nightly. (Patient taking differently: Inject 35 Units under the skin nightly. ) 15 mL 3   ??? insulin syringe-needle U-100 0.3 mL 31 gauge x 5/16 Syrg Use to inject insulin twice daily with meals 60 each 0   ??? isosorbide mononitrate (IMDUR) 60 MG 24 hr tablet Take 1 tablet (60 mg total) by mouth daily. DO NOT TAKE on dialysis days 90 tablet 3   ??? lancets Misc Use as directed to check blood glucose Four (4) times a day (before meals and nightly). 300  each 3   ??? lancets Misc Disp. lancets #200 or amount allowed, Testing QID, Dx: E11.65, Z79.4. (DM type 2 , Insulin Dep) 200 each 11   ??? loperamide (IMODIUM) 2 mg capsule Take 1 capsule twice a day and another capsule after each episode of diarrhea, up to 8 pills per day. 240 capsule 2   ??? metoprolol succinate (TOPROL XL) 100 MG 24 hr tablet Take 1 tablet (100 mg total) by mouth daily. 30 tablet 1   ??? mirtazapine (REMERON) 7.5 MG tablet Take 1 tablet (7.5 mg total) by mouth nightly. 30 tablet 0   ??? miscellaneous medical supply (BLOOD PRESSURE CUFF) Misc Measure BP once a day or if any symptoms 1 each 0   ??? nitroglycerin (NITROLINGUAL) 0.4 mg/dose spray Place 1 spray under the tongue every five (5) minutes as needed for chest pain. pT MUST HAVE SPRAY DUE TO DRY MOUTH AND PILLS DO NOT DISSOLVE 12 g 12   ??? omeprazole (PRILOSEC) 40 MG capsule Take 1 capsule (40 mg total) by mouth Two (2) times a day (30 minutes before a meal). 180 capsule 3   ??? ondansetron (ZOFRAN) 4 MG tablet Take 1 tablet (4 mg total) by mouth every twelve (12) hours as needed. 15 tablet 0   ??? ranolazine (RANEXA) 500 MG 12 hr tablet Take 1 tablet (500 mg total) by mouth Two (2) times a day. 60 tablet 6   ??? semaglutide (OZEMPIC) 0.25 mg or 0.5 mg(2 mg/1.5 mL) PnIj injection Inject 0.5 mg under the skin every seven (7) days. 4.5 mL 3   ??? sevelamer (RENVELA) 800 mg tablet Take 3 tablets (2,400 mg total) by mouth Three (3) times a day with a meal. 270 tablet 11   ??? ticagrelor (BRILINTA) 90 mg Tab TAKE 1 TABLET BY MOUTH TWICE DAILY 180 tablet 0   ??? UNIFINE PENTIPS 31 gauge x 5/16 (8 mm) Ndle USE WITH INSULIN AND VICTOZA 5 TIMES DAILY 400 each 3     No current facility-administered medications for this visit.        Changes to medications: [name] reports no changes at this time.    Allergies   Allergen Reactions   ??? Losartan      Hyperkalemia (K>6)   ??? Morphine Palpitations and Anxiety     Medication error: was administered 20mL instead of 2mL       Changes to allergies: No    SPECIALTY MEDICATION ADHERENCE     Praluent 150 mg/ml: 4 days of medicine on hand       Medication Adherence    Patient reported X missed doses in the last month: 0  Specialty Medication: Praluent 150 mg/mL  Informant: spouse          Specialty medication(s) dose(s) confirmed: Regimen is correct and unchanged.     Are there any concerns with adherence? No    Adherence counseling provided? Not needed    CLINICAL MANAGEMENT AND INTERVENTION      Clinical Benefit Assessment:    Do you feel the medicine is effective or helping your condition? Yes    Clinical Benefit counseling provided? Not needed    Adverse Effects Assessment:    Are you experiencing any side effects? No    Are you experiencing difficulty administering your medicine? No    Quality of Life Assessment:    How many days over the past month did your high cholesterol keep you from your normal activities? For example, brushing your teeth or getting  up in the morning. Patient declined to answer    Have you discussed this with your provider? Not needed    Therapy Appropriateness:    Is therapy appropriate? Yes, therapy is appropriate and should be continued    DISEASE/MEDICATION-SPECIFIC INFORMATION      For patients on injectable medications: Patient currently has 1 doses left.  Next injection is scheduled for 04/14/20.    PATIENT SPECIFIC NEEDS     - Does the patient have any physical, cognitive, or cultural barriers? No    - Is the patient high risk? No    - Does the patient require a Care Management Plan? No     - Does the patient require physician intervention or other additional services (i.e. nutrition, smoking cessation, social work)? No      SHIPPING     Specialty Medication(s) to be Shipped:   General Specialty: Praluent    Other medication(s) to be shipped: No additional medications requested for fill at this time     Changes to insurance: No    Delivery Scheduled: Yes, Expected medication delivery date: 04/18/20.     Medication will be delivered via [facility] outpatient pharmacy clinic to the confirmed prescription address in [place].    The patient will receive a drug information handout for each medication shipped and additional FDA Medication Guides as required.  Verified that patient has previously received a Conservation officer, historic buildings.    All of the patient's questions and concerns have been addressed.    Camillo Flaming   [facility] Specialty Pharmacist

## 2020-04-11 NOTE — Unmapped (Signed)
Has not been prescribed by me. Appears to have been prescribed by nephrology, possibly provider at his dialysis center

## 2020-04-12 ENCOUNTER — Emergency Department: Payer: Medicare Other

## 2020-04-12 ENCOUNTER — Other Ambulatory Visit: Payer: Medicare Other

## 2020-04-12 ENCOUNTER — Other Ambulatory Visit: Payer: Self-pay

## 2020-04-12 ENCOUNTER — Observation Stay
Admission: EM | Admit: 2020-04-12 | Discharge: 2020-04-13 | Disposition: A | Discharge status: Home / Self Care | Payer: Medicare Other | Attending: Family Medicine | Admitting: Family Medicine

## 2020-04-12 DIAGNOSIS — I482 Chronic atrial fibrillation, unspecified: Secondary | ICD-10-CM | POA: Diagnosis not present

## 2020-04-12 DIAGNOSIS — Z992 Dependence on renal dialysis: Secondary | ICD-10-CM | POA: Insufficient documentation

## 2020-04-12 DIAGNOSIS — Z7982 Long term (current) use of aspirin: Secondary | ICD-10-CM | POA: Diagnosis not present

## 2020-04-12 DIAGNOSIS — Z794 Long term (current) use of insulin: Secondary | ICD-10-CM | POA: Diagnosis not present

## 2020-04-12 DIAGNOSIS — I132 Hypertensive heart and chronic kidney disease with heart failure and with stage 5 chronic kidney disease, or end stage renal disease: Secondary | ICD-10-CM | POA: Insufficient documentation

## 2020-04-12 DIAGNOSIS — I2510 Atherosclerotic heart disease of native coronary artery without angina pectoris: Secondary | ICD-10-CM | POA: Insufficient documentation

## 2020-04-12 DIAGNOSIS — R4182 Altered mental status, unspecified: Secondary | ICD-10-CM | POA: Diagnosis present

## 2020-04-12 DIAGNOSIS — I480 Paroxysmal atrial fibrillation: Secondary | ICD-10-CM | POA: Insufficient documentation

## 2020-04-12 DIAGNOSIS — Z20822 Contact with and (suspected) exposure to covid-19: Secondary | ICD-10-CM | POA: Diagnosis not present

## 2020-04-12 DIAGNOSIS — E785 Hyperlipidemia, unspecified: Secondary | ICD-10-CM

## 2020-04-12 DIAGNOSIS — R55 Syncope and collapse: Secondary | ICD-10-CM | POA: Diagnosis not present

## 2020-04-12 DIAGNOSIS — E1122 Type 2 diabetes mellitus with diabetic chronic kidney disease: Secondary | ICD-10-CM | POA: Diagnosis not present

## 2020-04-12 DIAGNOSIS — Z79899 Other long term (current) drug therapy: Secondary | ICD-10-CM | POA: Insufficient documentation

## 2020-04-12 DIAGNOSIS — R778 Other specified abnormalities of plasma proteins: Secondary | ICD-10-CM | POA: Diagnosis present

## 2020-04-12 DIAGNOSIS — Z87891 Personal history of nicotine dependence: Secondary | ICD-10-CM | POA: Insufficient documentation

## 2020-04-12 DIAGNOSIS — N186 End stage renal disease: Secondary | ICD-10-CM | POA: Insufficient documentation

## 2020-04-12 DIAGNOSIS — I5022 Chronic systolic (congestive) heart failure: Secondary | ICD-10-CM | POA: Diagnosis not present

## 2020-04-12 DIAGNOSIS — M109 Gout, unspecified: Secondary | ICD-10-CM | POA: Diagnosis present

## 2020-04-12 DIAGNOSIS — F32A Depression, unspecified: Secondary | ICD-10-CM | POA: Diagnosis present

## 2020-04-12 DIAGNOSIS — K219 Gastro-esophageal reflux disease without esophagitis: Secondary | ICD-10-CM | POA: Diagnosis present

## 2020-04-12 DIAGNOSIS — R079 Chest pain, unspecified: Secondary | ICD-10-CM

## 2020-04-12 LAB — COMPREHENSIVE METABOLIC PANEL
ALT: 10 U/L (ref 0–44)
AST: 11 U/L — ABNORMAL LOW (ref 15–41)
Albumin: 2.7 g/dL — ABNORMAL LOW (ref 3.5–5.0)
Alkaline Phosphatase: 87 U/L (ref 38–126)
Anion gap: 11 (ref 5–15)
BUN: 38 mg/dL — ABNORMAL HIGH (ref 6–20)
CO2: 25 mmol/L (ref 22–32)
Calcium: 7.8 mg/dL — ABNORMAL LOW (ref 8.9–10.3)
Chloride: 101 mmol/L (ref 98–111)
Creatinine, Ser: 5.51 mg/dL — ABNORMAL HIGH (ref 0.61–1.24)
GFR, Estimated: 12 mL/min — ABNORMAL LOW (ref >60)
Glucose, Bld: 314 mg/dL — ABNORMAL HIGH (ref 70–99)
Potassium: 5 mmol/L (ref 3.5–5.1)
Sodium: 137 mmol/L (ref 135–145)
Total Bilirubin: 0.6 mg/dL (ref 0.3–1.2)
Total Protein: 6.6 g/dL (ref 6.5–8.1)

## 2020-04-12 LAB — CBC WITH DIFFERENTIAL/PLATELET
Abs Immature Granulocytes: 0.03 10*3/uL (ref 0.00–0.07)
Basophils Absolute: 0.1 10*3/uL (ref 0.0–0.1)
Basophils Relative: 1 %
Eosinophils Absolute: 0.2 10*3/uL (ref 0.0–0.5)
Eosinophils Relative: 2 %
HCT: 31.4 % — ABNORMAL LOW (ref 39.0–52.0)
Hemoglobin: 10 g/dL — ABNORMAL LOW (ref 13.0–17.0)
Immature Granulocytes: 0 %
Lymphocytes Relative: 13 %
Lymphs Abs: 0.9 10*3/uL (ref 0.7–4.0)
MCH: 28.7 pg (ref 26.0–34.0)
MCHC: 31.8 g/dL (ref 30.0–36.0)
MCV: 90.2 fL (ref 80.0–100.0)
Monocytes Absolute: 0.5 10*3/uL (ref 0.1–1.0)
Monocytes Relative: 7 %
Neutro Abs: 5.2 10*3/uL (ref 1.7–7.7)
Neutrophils Relative %: 77 %
Platelets: 214 10*3/uL (ref 150–400)
RBC: 3.48 MIL/uL — ABNORMAL LOW (ref 4.22–5.81)
RDW: 15.8 % — ABNORMAL HIGH (ref 11.5–15.5)
WBC: 6.8 10*3/uL (ref 4.0–10.5)
nRBC: 0 % (ref 0.0–0.2)

## 2020-04-12 LAB — CBG MONITORING, ED
Glucose-Capillary: 184 mg/dL — ABNORMAL HIGH (ref 70–99)
Glucose-Capillary: 166 mg/dL — ABNORMAL HIGH (ref 70–99)

## 2020-04-12 LAB — MAGNESIUM: Magnesium: 2.1 mg/dL (ref 1.7–2.4)

## 2020-04-12 LAB — TROPONIN I (HIGH SENSITIVITY)
Troponin I (High Sensitivity): 32 ng/L — ABNORMAL HIGH (ref <18)
Troponin I (High Sensitivity): 43 ng/L — ABNORMAL HIGH (ref <18)
Troponin I (High Sensitivity): 55 ng/L — ABNORMAL HIGH (ref <18)

## 2020-04-12 LAB — HEMOGLOBIN A1C
Hgb A1c MFr Bld: 8.1 % — ABNORMAL HIGH (ref 4.8–5.6)
Mean Plasma Glucose: 185.77 mg/dL

## 2020-04-12 IMAGING — CT CT HEAD W/O CM
3 series · 16 of 47 positions shown, 19 images · non-contrast
Comparison: None.

CLINICAL DATA: Orthostatic hypertension. Post fall. Head trauma.

EXAM:
CT HEAD WITHOUT CONTRAST
TECHNIQUE: Contiguous axial images were obtained from the base of the skull
through the vertex without intravenous contrast.

[Series 2: head wo · axial · 0.44mm/px · z∈[+296,+421]mm · 10 of 31 slices shown, 13 images]
[im 3/31  brain]
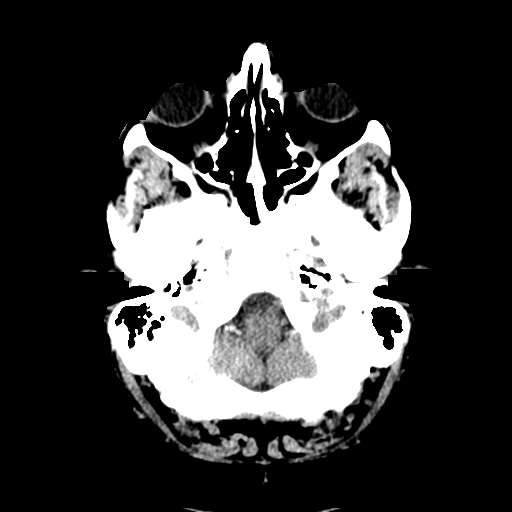
[im 3/31  bone]
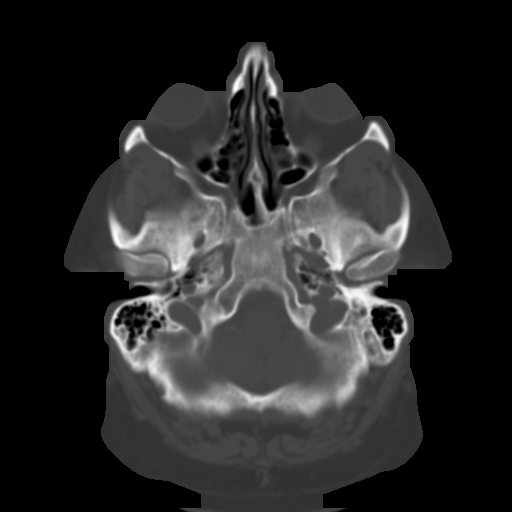
[im 6/31  brain]
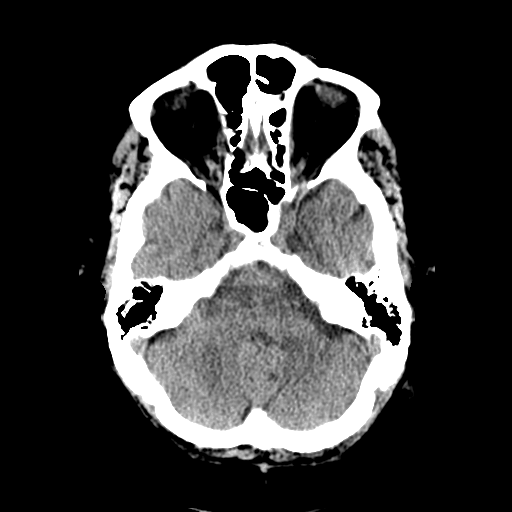
[im 9/31  brain]
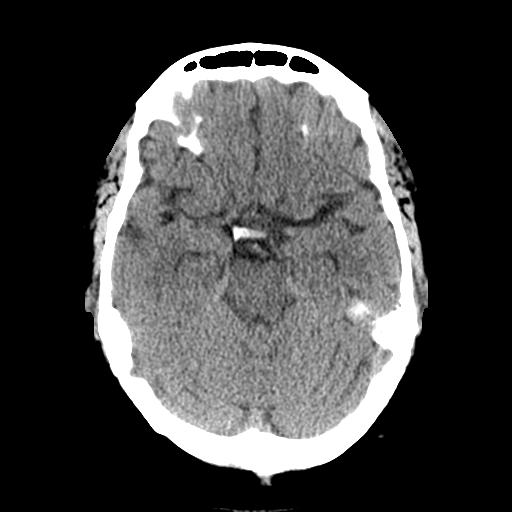
[im 11/31  brain]
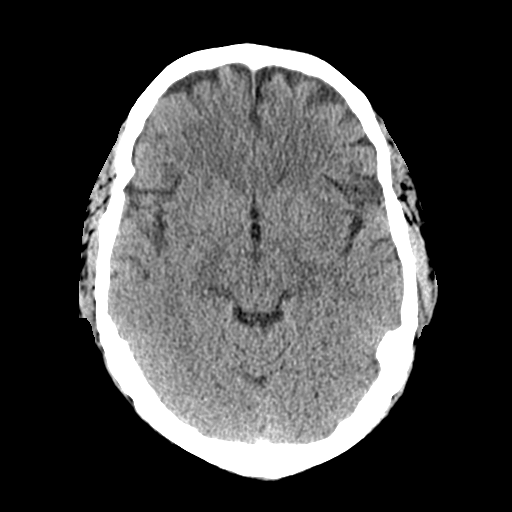
[im 14/31  brain]
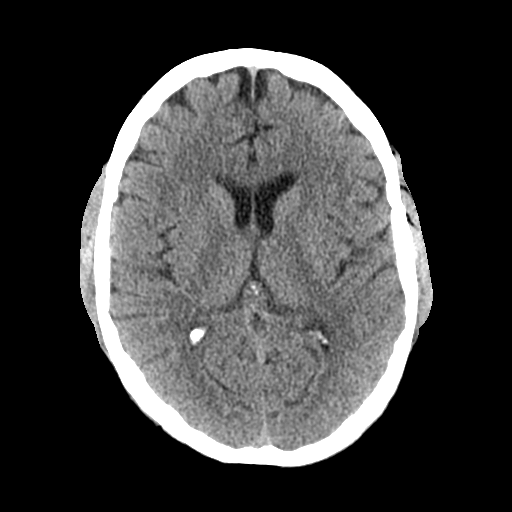
[im 14/31  bone]
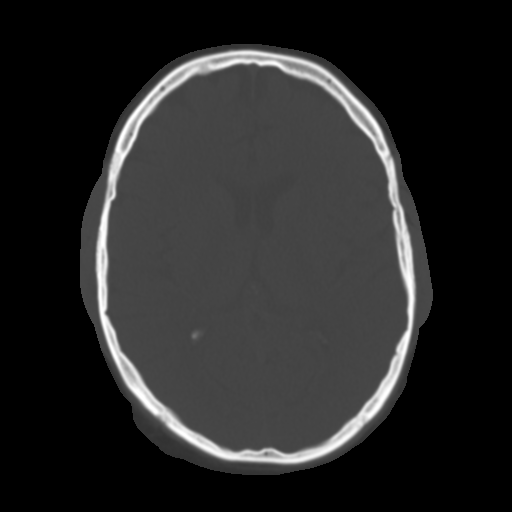
[im 17/31  brain]
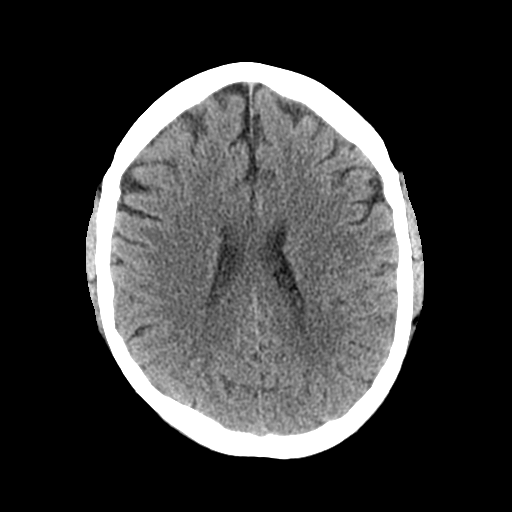
[im 20/31  brain]
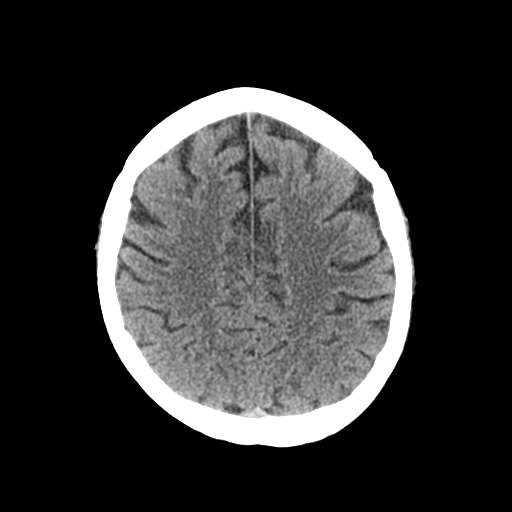
[im 23/31  brain]
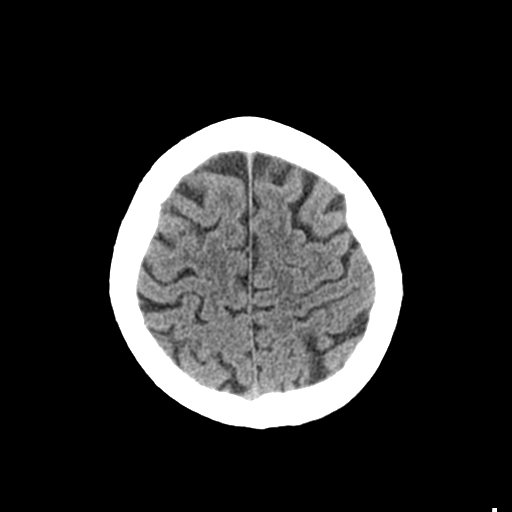
[im 25/31  brain]
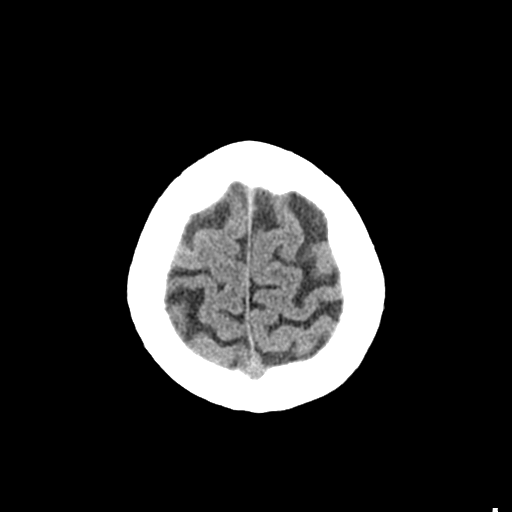
[im 25/31  bone]
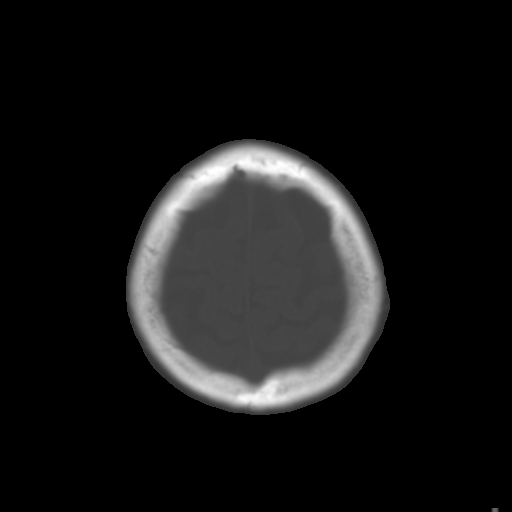
[im 28/31  brain]
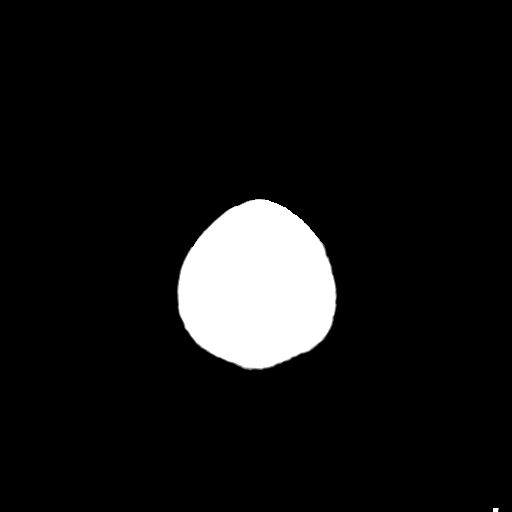

[Series 4: coronal soft tissue · coronal · 0.32mm/px · 3 of 62 slices shown]
[im 21/62  brain]
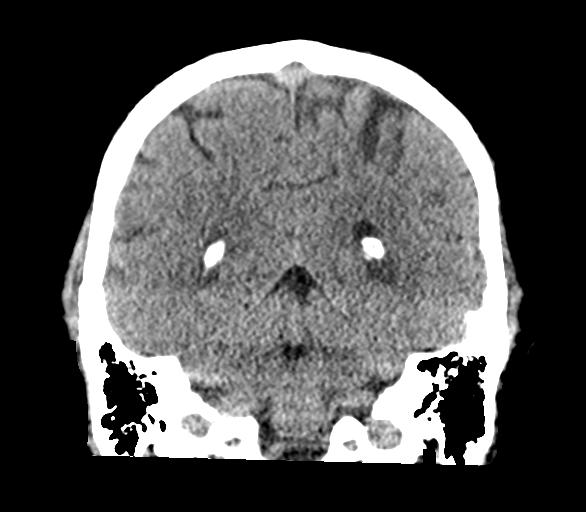
[im 28/62  brain]
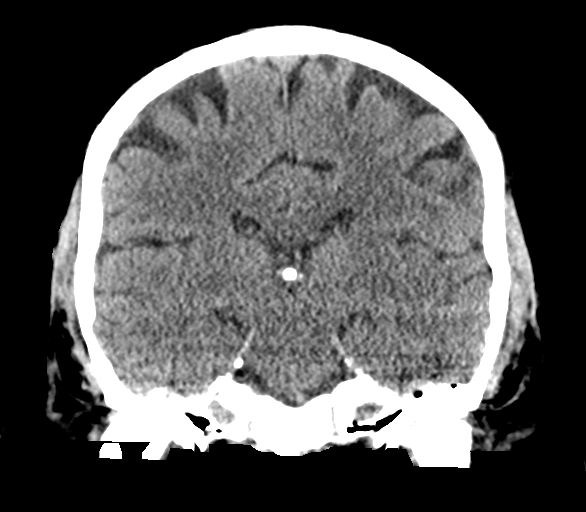
[im 34/62  brain]
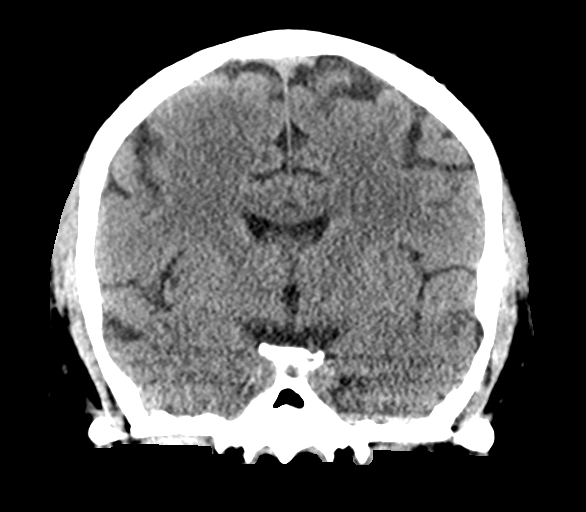

[Series 5: sagittal soft tissue · sagittal · 0.32mm/px · 3 of 57 slices shown]
[im 19/57  brain]
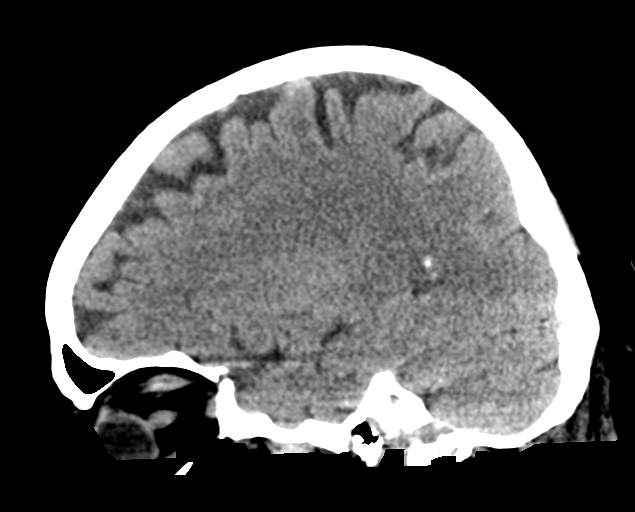
[im 29/57  brain]
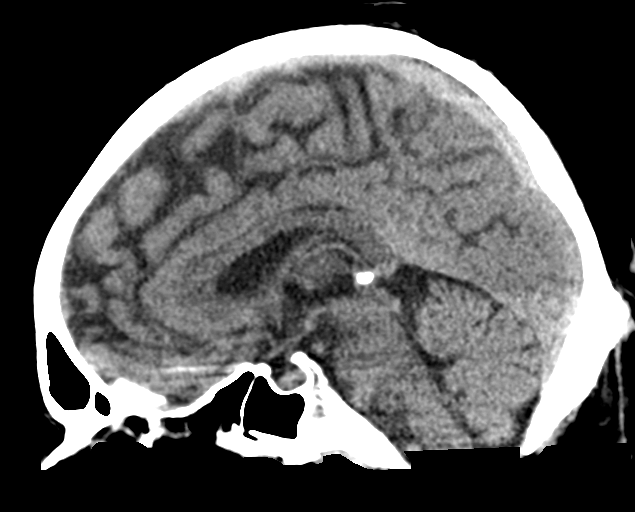
[im 38/57  brain]
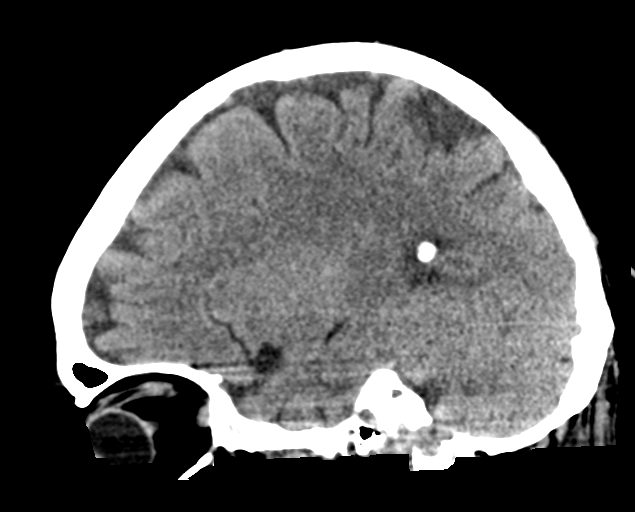

[16 of 47 positions shown; findings below may reference images not displayed]

FINDINGS: Brain: No evidence of acute infarction, hemorrhage, hydrocephalus,
extra-axial collection or mass lesion/mass effect.

Vascular: No hyperdense vessel or unexpected calcification.

Skull: Normal. Negative for fracture or focal lesion.

Sinuses/Orbits: Mild ethmoid sinusitis.

Other: None.
IMPRESSION: No acute intracranial abnormality.

## 2020-04-12 MED ORDER — AMIODARONE HCL 200 MG PO TABS
200.0000 mg | ORAL_TABLET | Freq: Two times a day (BID) | ORAL | Status: DC
  Administered 2020-04-13 (×2): 200 mg via ORAL
  Filled 2020-04-12 (×3): qty 1

## 2020-04-12 MED ORDER — ISOSORBIDE MONONITRATE ER 60 MG PO TB24
30.0000 mg | ORAL_TABLET | Freq: Every day | ORAL | Status: DC
  Administered 2020-04-13: 30 mg via ORAL
  Filled 2020-04-12: qty 1

## 2020-04-12 MED ORDER — EZETIMIBE 10 MG PO TABS: 10.0000 mg | ORAL_TABLET | Freq: Every day | ORAL | Status: DC

## 2020-04-12 MED ORDER — AMLODIPINE BESYLATE 5 MG PO TABS
10.0000 mg | ORAL_TABLET | Freq: Every day | ORAL | Status: DC
  Administered 2020-04-13: 10 mg via ORAL
  Filled 2020-04-12: qty 2

## 2020-04-12 MED ORDER — NITROGLYCERIN 0.4 MG SL SUBL: 0.4000 mg | SUBLINGUAL_TABLET | SUBLINGUAL | Status: DC | PRN

## 2020-04-12 MED ORDER — INSULIN DETEMIR 100 UNIT/ML ~~LOC~~ SOLN
30.0000 [IU] | Freq: Every day | SUBCUTANEOUS | Status: DC
  Administered 2020-04-13: 30 [IU] via SUBCUTANEOUS
  Filled 2020-04-12 (×2): qty 0.3

## 2020-04-12 MED ORDER — APIXABAN 5 MG PO TABS
5.0000 mg | ORAL_TABLET | Freq: Two times a day (BID) | ORAL | Status: DC
  Administered 2020-04-12 – 2020-04-13 (×2): 5 mg via ORAL
  Filled 2020-04-12 (×2): qty 1

## 2020-04-12 MED ORDER — TICAGRELOR 90 MG PO TABS
90.0000 mg | ORAL_TABLET | Freq: Two times a day (BID) | ORAL | Status: DC
  Administered 2020-04-12 – 2020-04-13 (×2): 90 mg via ORAL
  Filled 2020-04-12 (×2): qty 1

## 2020-04-12 MED ORDER — EZETIMIBE 10 MG PO TABS
10.0000 mg | ORAL_TABLET | Freq: Every day | ORAL | Status: DC
  Filled 2020-04-12: qty 1

## 2020-04-12 MED ORDER — VITAMIN D3 25 MCG (1000 UNIT) PO TABS
5000.0000 [IU] | ORAL_TABLET | Freq: Every day | ORAL | Status: DC
  Administered 2020-04-13: 5000 [IU] via ORAL
  Filled 2020-04-12 (×2): qty 5

## 2020-04-12 MED ORDER — HEPARIN SODIUM (PORCINE) 5000 UNIT/ML IJ SOLN: 5000.0000 [IU] | Freq: Three times a day (TID) | INTRAMUSCULAR | Status: DC

## 2020-04-12 MED ORDER — BUMETANIDE 1 MG PO TABS
2.0000 mg | ORAL_TABLET | Freq: Two times a day (BID) | ORAL | Status: DC
  Administered 2020-04-13 (×2): 2 mg via ORAL
  Filled 2020-04-12 (×3): qty 2

## 2020-04-12 MED ORDER — ALBUTEROL SULFATE HFA 108 (90 BASE) MCG/ACT IN AERS
2.0000 | INHALATION_SPRAY | RESPIRATORY_TRACT | Status: DC | PRN
  Filled 2020-04-12: qty 6.7

## 2020-04-12 MED ORDER — FLUOXETINE HCL 20 MG PO CAPS
60.0000 mg | ORAL_CAPSULE | Freq: Every day | ORAL | Status: DC
  Administered 2020-04-12: 60 mg via ORAL
  Filled 2020-04-12: qty 3

## 2020-04-12 MED ORDER — METOPROLOL SUCCINATE ER 50 MG PO TB24
100.0000 mg | ORAL_TABLET | Freq: Every day | ORAL | Status: DC
  Administered 2020-04-13 (×2): 100 mg via ORAL
  Filled 2020-04-12 (×2): qty 2

## 2020-04-12 MED ORDER — METOPROLOL SUCCINATE ER 50 MG PO TB24: 100.0000 mg | ORAL_TABLET | Freq: Every day | ORAL | Status: DC

## 2020-04-12 MED ORDER — PANTOPRAZOLE SODIUM 40 MG PO TBEC
40.0000 mg | DELAYED_RELEASE_TABLET | Freq: Every day | ORAL | Status: DC
  Administered 2020-04-13: 40 mg via ORAL
  Filled 2020-04-12: qty 1

## 2020-04-12 MED ORDER — ASPIRIN 81 MG PO CHEW
81.0000 mg | CHEWABLE_TABLET | ORAL | Status: DC
  Administered 2020-04-13: 81 mg via ORAL
  Filled 2020-04-12: qty 1

## 2020-04-12 MED ORDER — ATORVASTATIN CALCIUM 20 MG PO TABS
80.0000 mg | ORAL_TABLET | Freq: Every evening | ORAL | Status: DC
  Filled 2020-04-12: qty 4

## 2020-04-12 MED ORDER — RANOLAZINE ER 500 MG PO TB12
500.0000 mg | ORAL_TABLET | Freq: Two times a day (BID) | ORAL | Status: DC
  Administered 2020-04-13 (×2): 500 mg via ORAL
  Filled 2020-04-12 (×3): qty 1

## 2020-04-12 MED ORDER — ADULT MULTIVITAMIN W/MINERALS CH
1.0000 | ORAL_TABLET | Freq: Every day | ORAL | Status: DC
  Administered 2020-04-13: 1 via ORAL
  Filled 2020-04-12: qty 1

## 2020-04-12 MED ORDER — BUMETANIDE 1 MG PO TABS: 2.0000 mg | ORAL_TABLET | Freq: Two times a day (BID) | ORAL | Status: DC

## 2020-04-12 MED ORDER — HYDRALAZINE HCL 20 MG/ML IJ SOLN: 5.0000 mg | INTRAMUSCULAR | Status: DC | PRN

## 2020-04-12 MED ORDER — ACETAMINOPHEN 325 MG PO TABS: 650.0000 mg | ORAL_TABLET | Freq: Four times a day (QID) | ORAL | Status: DC | PRN

## 2020-04-12 MED ORDER — ATORVASTATIN CALCIUM 20 MG PO TABS
80.0000 mg | ORAL_TABLET | Freq: Every day | ORAL | Status: DC
  Administered 2020-04-12: 80 mg via ORAL

## 2020-04-12 MED ORDER — INSULIN ASPART 100 UNIT/ML ~~LOC~~ SOLN
0.0000 [IU] | Freq: Three times a day (TID) | SUBCUTANEOUS | Status: DC
  Administered 2020-04-12: 2 [IU] via SUBCUTANEOUS
  Administered 2020-04-13: 3 [IU] via SUBCUTANEOUS
  Filled 2020-04-12 (×2): qty 1

## 2020-04-12 MED ORDER — DICLOFENAC SODIUM 1 % EX GEL
2.0000 g | Freq: Three times a day (TID) | CUTANEOUS | Status: DC | PRN
  Filled 2020-04-12: qty 100

## 2020-04-12 MED ORDER — SEVELAMER CARBONATE 800 MG PO TABS
1600.0000 mg | ORAL_TABLET | Freq: Three times a day (TID) | ORAL | Status: DC
  Administered 2020-04-13 (×2): 1600 mg via ORAL
  Filled 2020-04-12 (×3): qty 2

## 2020-04-12 MED ORDER — GABAPENTIN 300 MG PO CAPS
300.0000 mg | ORAL_CAPSULE | Freq: Two times a day (BID) | ORAL | Status: DC
  Administered 2020-04-12 – 2020-04-13 (×2): 300 mg via ORAL
  Filled 2020-04-12 (×2): qty 1

## 2020-04-12 MED ORDER — ALLOPURINOL 100 MG PO TABS
100.0000 mg | ORAL_TABLET | Freq: Two times a day (BID) | ORAL | Status: DC
  Administered 2020-04-13 (×2): 100 mg via ORAL
  Filled 2020-04-12 (×3): qty 1

## 2020-04-12 MED ORDER — INSULIN ASPART 100 UNIT/ML ~~LOC~~ SOLN: 0.0000 [IU] | Freq: Every day | SUBCUTANEOUS | Status: DC

## 2020-04-12 NOTE — Consult Note (Signed)
Cardiology Consultation Note    Patient ID: Edwin [name], MRN: [id number], DOB/AGE: [birth year] 50 y.o. Admit date: 04/12/2020   Date of Consult: 04/12/2020 Primary Physician: Caryl Never, MD Primary Cardiologist: Dr. Howard Pouch, [facility]  Chief Complaint: syncope Reason for Consultation: syncope Requesting MD: Dr. Charna Archer  HPI: Edwin [name] is a 50 y.o. male with history of Coronary artery disease involving native coronary artery of native heart without angina pectoris,  Chronic heart failure with reduced ejection fraction and diastolic dysfunction  Paroxysmal atrial fibrillation . Essential hypertensiona and CKD 5. [facility] recently was [facility] with nstemi. H underwent cardiac cath which revealed 80% ostial D1 stenosis as well as 90%  ISR of the mid-LAD at the bifurcation of D1 now s/p successful PCI to D1  with a 2.25x15 Xience Skypoint DES. . 90% in-stent restenosis of the distal RCA also appreciated and new compared to 07/2019 study. IVUS was performed and confirmed ISR with well-expanded stents. Now s/p PTCA of the distal RCA/rPDA with a 4.0x12 Samoset balloon to high pressure. 3. Elevated left ventricular filling pressures (LVEDP = 30 mm Hg). He has a boaston aicd pacemaker, sleep apnea, He presented to the er with a syncopal episdoe. Interogation of his device revealed probable afib with rvr at time of event. Did not receive a shock. Currenlty in nsr.        Past Medical History:  Diagnosis Date  . CAD (coronary artery disease)   . Chronic systolic heart failure ([place])   . Diabetes mellitus without complication ([place])   . Heart attack ([place])   . Hyperlipidemia   . Hypertension   . Ischemic cardiomyopathy   . Paroxysmal atrial fibrillation ([name])   . Renal disorder       Surgical History:  Past Surgical History:  Procedure Laterality Date  . [employer] pacemaker    . CARDIAC SURGERY    . IRRIGATION AND DEBRIDEMENT KNEE Left 12/17/2017   Procedure: LEFT KNEE  DEBRIDEMENT AND SYNOVECTOMY;  Surgeon: Thornton Park, MD;  Location: [facility] ORS;  Service: Orthopedics;  Laterality: Left;  . JOINT REPLACEMENT    . LEFT HEART CATH AND CORONARY ANGIOGRAPHY Left 03/09/2018   Procedure: LEFT HEART CATH AND CORONARY ANGIOGRAPHY;  Surgeon: Nelva Bush, MD;  Location: [place] [facility];  Service: Cardiovascular;  Laterality: Left;  . LEFT HEART CATH AND CORONARY ANGIOGRAPHY N/A 01/25/2020   Procedure: LEFT HEART CATH AND CORONARY ANGIOGRAPHY;  Surgeon: Isaias Cowman, MD;  Location: [place] CV [facility];  Service: Cardiovascular;  Laterality: N/A;  . PERCUTANEOUS CORONARY STENT INTERVENTION (PCI-S)       Home Meds: Prior to Admission medications   Medication Sig Start Date End Date Taking? Authorizing Provider  albuterol (PROVENTIL HFA;VENTOLIN HFA) 108 (90 Base) MCG/ACT inhaler Inhale 2 puffs into the lungs every 6 (six) hours as needed for wheezing or shortness of breath. 04/29/18   Schuyler Amor, MD  allopurinol (ZYLOPRIM) 100 MG tablet Take 100 mg by mouth 2 (two) times daily.  03/18/18   [provider]  amiodarone (NEXTERONE PREMIX) 360-4.14 MG/200ML-% SOLN Inject 30 mg/hr into the vein continuous. 01/25/20   Loletha Grayer, MD  amLODipine (NORVASC) 10 MG tablet Take 10 mg by mouth daily. 09/30/18   [provider]  aspirin 81 MG chewable tablet Chew 1 tablet (81 mg total) by mouth before cath procedure. 01/26/20   Loletha Grayer, MD  atorvastatin (LIPITOR) 80 MG tablet Take 80 mg by mouth every evening.  09/14/17   [provider]  bumetanide (BUMEX) 2 MG tablet Take 2 mg by mouth 2 (two) times daily. 12/06/19   [provider]  cholecalciferol (VITAMIN D) 1000 units tablet Take 5,000 Units by mouth daily.    [provider]  ezetimibe (ZETIA) 10 MG tablet Take 10 mg by mouth daily. 10/14/17   [provider]  FLUoxetine (PROZAC) 20 MG capsule Take 60 mg by mouth at bedtime.  12/04/17    [provider]  gabapentin (NEURONTIN) 300 MG capsule Take 300 mg by mouth 2 (two) times a day. 09/27/18   [provider]  heparin 25000-0.45 UT/250ML-% infusion Inject 2,400 Units/hr into the vein continuous. 01/25/20   Loletha Grayer, MD  insulin aspart (NOVOLOG) 100 UNIT/ML injection Inject 20 Units into the skin 3 (three) times daily with meals. 01/25/20   Loletha Grayer, MD  insulin detemir (LEVEMIR) 100 UNIT/ML injection Inject 0.4 mLs (40 Units total) into the skin at bedtime. 01/25/20   Loletha Grayer, MD  isosorbide mononitrate (IMDUR) 30 MG 24 hr tablet Take 1 tablet (30 mg total) by mouth daily. 10/30/18   Vaughan Basta, MD  metoprolol succinate (TOPROL-XL) 100 MG 24 hr tablet Take 100 mg by mouth daily. 01/03/20   [provider]  Multiple Vitamin (MULTIVITAMIN WITH MINERALS) TABS tablet Take 1 tablet by mouth daily. 10/31/18   Vaughan Basta, MD  nitroGLYCERIN (NITROSTAT) 0.4 MG SL tablet Place 0.4 mg under the tongue every 5 (five) minutes as needed for chest pain.    [provider]  omeprazole (PRILOSEC) 40 MG capsule Take 40 mg by mouth 2 (two) times daily. 11/25/19   [provider]  ondansetron (ZOFRAN) 4 MG tablet Take 4 mg by mouth every 12 (twelve) hours as needed for nausea. 01/04/20   [provider]  RENVELA 800 MG tablet Take 1,600 mg by mouth 3 (three) times daily with meals. 10/14/17   [provider]  VOLTAREN 1 % GEL Apply 2 g topically 3 (three) times daily as needed (left leg pain).  12/04/17   [provider]    Inpatient Medications:     Allergies:  Allergies  Allergen Reactions  . Losartan     Hyperkalemia (K>6)  . Morphine Anxiety  . Morphine And Related Other (See Comments)    Overdose in hospital per patient report    Social History   Socioeconomic History  . Marital status: Married    Spouse name: Not on file  . Number of children: Not on file  . Years of  education: Not on file  . Highest education level: Not on file  Occupational History  . Not on file  Tobacco Use  . 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 Smokeless tobacco: Current User    Types: Snuff  Vaping Use  . Vaping Use: Never used  Substance and Sexual Activity  . Alcohol use: Never  . Drug use: Never  . Sexual activity: Not Currently    Birth control/protection: None  Other Topics Concern  . Not on file  Social History Narrative  . Not on file   Social Determinants of Health   Financial Resource Strain: Not on file  Food Insecurity: Not on file  Transportation Needs: Not on file  Physical Activity: Not on file  Stress: Not on file  Social Connections: Not on file  Intimate Partner Violence: Not on file     Family History  Problem Relation Age of Onset  . Diabetes Mother   .  Peripheral vascular disease Mother   . COPD Father      Review of Systems: A 12-system review of systems was performed and is negative except as noted in the HPI.  Labs: No results for input(s): CKTOTAL, CKMB, TROPONINI in the last 72 hours. Lab Results  Component Value Date   WBC 6.8 04/12/2020   HGB 10.0 (L) 04/12/2020   HCT 31.4 (L) 04/12/2020   MCV 90.2 04/12/2020   PLT 214 04/12/2020    Recent Labs  Lab 04/12/20 1332  NA 137  K 5.0  CL 101  CO2 25  BUN 38*  CREATININE 5.51*  CALCIUM 7.8*  PROT 6.6  BILITOT 0.6  ALKPHOS 87  ALT 10  AST 11*  GLUCOSE 314*   Lab Results  Component Value Date   CHOL 213 (H) 01/24/2020   HDL 33 (L) 01/24/2020   LDLCALC 133 (H) 01/24/2020   TRIG 237 (H) 01/24/2020   No results found for: DDIMER  Radiology/Studies:  CT Head Wo Contrast  Result Date: 04/12/2020 CLINICAL DATA:  50 year old male with a history mental status changes EXAM: CT HEAD WITHOUT CONTRAST TECHNIQUE: Contiguous axial images were obtained from the base of the skull through the vertex without intravenous contrast. COMPARISON:  07/08/2018 FINDINGS: Brain: No acute  intracranial hemorrhage. No midline shift or mass effect. Gray-white differentiation maintained. Unremarkable appearance of the ventricular system. Vascular: Unremarkable. Skull: No acute fracture.  No aggressive bone lesion identified. 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 Unremarkable paranasal sinuses which are clear. No mastoid effusion. Other: None IMPRESSION: Negative for acute intracranial abnormality. Interval development of bilateral exophthalmos, which can be seen with thyroid disease. Correlation with thyroid labs recommended. Electronically Signed   By: Corrie Mckusick D.O.   On: 04/12/2020 14:55   DG Chest Portable 1 View  Result Date: 04/12/2020 CLINICAL DATA:  Seizure activity, vomiting, history pacemaker, coronary artery disease, chronic systolic heart failure, diabetes mellitus, hypertension, ischemic cardiomyopathy, paroxysmal atrial fibrillation EXAM: PORTABLE CHEST 1 VIEW COMPARISON:  Portable exam 1345 hours compared to 01/23/2020 FINDINGS: RIGHT jugular dual-lumen central venous catheter with tips projecting over RIGHT atrium. LEFT subclavian AICD with leads projecting over RIGHT atrium and RIGHT ventricle. Enlargement of cardiac silhouette with vascular congestion. Stable mediastinal contours. Decreased lung volumes without gross infiltrate, pleural effusion or pneumothorax. Osseous structures unremarkable. IMPRESSION: Enlargement of cardiac silhouette with pulmonary vascular congestion post ICD. No acute abnormalities. Electronically Signed   By: Lavonia Dana M.D.   On: 04/12/2020 13:57    Wt Readings from Last 3 Encounters:  04/12/20 (!) 165 kg  01/25/20 (!) 164.5 kg  01/14/20 (!) 160.8 kg    EKG: nsr  Physical Exam:  Blood pressure (!) 148/74, pulse 73, resp. rate (!) 5, height 6\' 2"  (1.88 m), weight (!) 165 kg, SpO2 100 %. Body mass index is 46.7 kg/m. General: Well developed, well  nourished, in no acute distress. Head: Normocephalic, atraumatic, sclera non-icteric, no xanthomas, nares are without discharge.  Neck: Negative for carotid bruits. JVD not elevated. Lungs: Clear bilaterally to auscultation without wheezes, rales, or rhonchi. Breathing is unlabored. Heart: RRR with S1 S2. No murmurs, rubs, or gallops appreciated. Abdomen: Soft, non-tender, non-distended with normoactive bowel sounds. No hepatomegaly. No rebound/guarding. No obvious abdominal masses. Msk:  Strength and tone appear normal for age. Extremities: No clubbing or cyanosis. No edema.  Distal pedal pulses are 2+ and equal bilaterally. Neuro: Alert and oriented X  3. No facial asymmetry. No focal deficit. Moves all extremities spontaneously. Psych:  Responds to questions appropriately with a normal affect.     Assessment and Plan  Syncope-likely secoondary to afib with rvr. Will increase amiodarone to 200 bid and conitnue elaquis.  Cad-s/p pci and ptca at [facility]. Conitnue duap.  esrd-will need hd in am.     Signed, Teodoro Spray MD 04/12/2020, 3:28 PM Pager: [phone]

## 2020-04-12 NOTE — ED Provider Notes (Signed)
[facility] Emergency Department Provider Note   ____________________________________________   Event Date/Time   First MD Initiated Contact with Patient 04/12/20 1330     (approximate)  I have reviewed the triage vital signs and the nursing notes.   HISTORY  Chief Complaint Altered Mental Status    HPI Edwin [name] is a 50 y.o. male with past medical history of hypertension, hyperlipidemia, diabetes, CAD, CHF, paroxysmal A. fib, and ESRD on HD ([name]) who presents to the ED for altered mental status.  Per EMS, patient's family initially called out for cardiac arrest but when they arrived they found the patient to be awake but somnolent.  Wife stated that he had started feeling badly while driving, had 1 episode of vomiting, and then became less responsive.  EMS reported 2 seizure-like episodes during transport where the right side of his body seem to shake and he briefly lost consciousness, each episode lasting about 10 seconds with no postictal state.  Patient states he has had similar episodes before and will often feel nauseous with vomiting when his pacemaker is active.  He states he has never lost consciousness with these episodes in the past, he also had chest pain today which is different than usual.  He denies any history of seizures and has not had any recent changes in his medications.  He did have angiography with stent placement at [facility] last month.  He reports having a full run of dialysis yesterday with no issues.        Past Medical History:  Diagnosis Date  . CAD (coronary artery disease)   . Chronic systolic heart failure ([place])   . Diabetes mellitus without complication ([place])   . Heart attack ([place])   . Hyperlipidemia   . Hypertension   . Ischemic cardiomyopathy   . Paroxysmal atrial fibrillation ([name])   . Renal disorder     Patient Active Problem List   Diagnosis Date Noted  . Syncope 04/12/2020  . Elevated troponin 04/12/2020  .  Atrial fibrillation, chronic ([place]) 04/12/2020  . GERD (gastroesophageal reflux disease) 04/12/2020  . Gout 04/12/2020  . Depression 04/12/2020  . Obesity, Class III, BMI 40-49.9 (morbid obesity) ([place])   . Chronic systolic CHF (congestive heart failure) ([place])   . ESRD (end stage renal disease) ([place]) 01/23/2020  . CAD (coronary artery disease) 01/23/2020  . Type 2 diabetes mellitus with ESRD (end-stage renal disease) ([place]) 01/23/2020  . Essential hypertension 01/23/2020  . Hyperlipidemia 01/23/2020  . Atrial fibrillation with rapid ventricular response ([place]) 01/23/2020  . Syncope and collapse 10/28/2018  . Chest pain 07/08/2018  . NSTEMI (non-ST elevated myocardial infarction) ([place]) 03/08/2018  . Infection of left knee ([place]) 12/16/2017    Past Surgical History:  Procedure Laterality Date  . [employer] pacemaker    . CARDIAC SURGERY    . IRRIGATION AND DEBRIDEMENT KNEE Left 12/17/2017   Procedure: LEFT KNEE DEBRIDEMENT AND SYNOVECTOMY;  Surgeon: Thornton Park, MD;  Location: [facility] ORS;  Service: Orthopedics;  Laterality: Left;  . JOINT REPLACEMENT    . LEFT HEART CATH AND CORONARY ANGIOGRAPHY Left 03/09/2018   Procedure: LEFT HEART CATH AND CORONARY ANGIOGRAPHY;  Surgeon: Nelva Bush, MD;  Location: [facility];  Service: Cardiovascular;  Laterality: Left;  . LEFT HEART CATH AND CORONARY ANGIOGRAPHY N/A 01/25/2020   Procedure: LEFT HEART CATH AND CORONARY ANGIOGRAPHY;  Surgeon: Isaias Cowman, MD;  Location: [facility];  Service: Cardiovascular;  Laterality: N/A;  . PERCUTANEOUS  CORONARY STENT INTERVENTION (PCI-S)      Prior to Admission medications   Medication Sig Start Date End Date Taking? Authorizing Provider  albuterol (PROVENTIL HFA;VENTOLIN HFA) 108 (90 Base) MCG/ACT inhaler Inhale 2 puffs into the lungs every 6 (six) hours as needed for wheezing or shortness of breath. 04/29/18   Schuyler Amor, MD  allopurinol (ZYLOPRIM) 100 MG tablet  Take 100 mg by mouth 2 (two) times daily.  03/18/18   [provider]  amiodarone (NEXTERONE PREMIX) 360-4.14 MG/200ML-% SOLN Inject 30 mg/hr into the vein continuous. 01/25/20   Loletha Grayer, MD  amLODipine (NORVASC) 10 MG tablet Take 10 mg by mouth daily. 09/30/18   [provider]  aspirin 81 MG chewable tablet Chew 1 tablet (81 mg total) by mouth before cath procedure. 01/26/20   Loletha Grayer, MD  atorvastatin (LIPITOR) 80 MG tablet Take 80 mg by mouth every evening.  09/14/17   [provider]  bumetanide (BUMEX) 2 MG tablet Take 2 mg by mouth 2 (two) times daily. 12/06/19   [provider]  cholecalciferol (VITAMIN D) 1000 units tablet Take 5,000 Units by mouth daily.    [provider]  ezetimibe (ZETIA) 10 MG tablet Take 10 mg by mouth daily. 10/14/17   [provider]  FLUoxetine (PROZAC) 20 MG capsule Take 60 mg by mouth at bedtime.  12/04/17   [provider]  gabapentin (NEURONTIN) 300 MG capsule Take 300 mg by mouth 2 (two) times a day. 09/27/18   [provider]  heparin 25000-0.45 UT/250ML-% infusion Inject 2,400 Units/hr into the vein continuous. 01/25/20   Loletha Grayer, MD  insulin aspart (NOVOLOG) 100 UNIT/ML injection Inject 20 Units into the skin 3 (three) times daily with meals. 01/25/20   Loletha Grayer, MD  insulin detemir (LEVEMIR) 100 UNIT/ML injection Inject 0.4 mLs (40 Units total) into the skin at bedtime. 01/25/20   Loletha Grayer, MD  isosorbide mononitrate (IMDUR) 30 MG 24 hr tablet Take 1 tablet (30 mg total) by mouth daily. 10/30/18   Vaughan Basta, MD  metoprolol succinate (TOPROL-XL) 100 MG 24 hr tablet Take 100 mg by mouth daily. 01/03/20   [provider]  Multiple Vitamin (MULTIVITAMIN WITH MINERALS) TABS tablet Take 1 tablet by mouth daily. 10/31/18   Vaughan Basta, MD  nitroGLYCERIN (NITROSTAT) 0.4 MG SL tablet Place 0.4 mg under the tongue every 5 (five)  minutes as needed for chest pain.    [provider]  omeprazole (PRILOSEC) 40 MG capsule Take 40 mg by mouth 2 (two) times daily. 11/25/19   [provider]  ondansetron (ZOFRAN) 4 MG tablet Take 4 mg by mouth every 12 (twelve) hours as needed for nausea. 01/04/20   [provider]  RENVELA 800 MG tablet Take 1,600 mg by mouth 3 (three) times daily with meals. 10/14/17   [provider]  VOLTAREN 1 % GEL Apply 2 g topically 3 (three) times daily as needed (left leg pain).  12/04/17   [provider]    Allergies Losartan, Morphine, and Morphine and related  Family History  Problem Relation Age of Onset  . Diabetes Mother   . Peripheral vascular disease Mother   . COPD Father     Social History Social History   Tobacco Use  . 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 Smokeless tobacco: Current User    Types: Snuff  Vaping Use  . Vaping Use: Never used  Substance Use Topics  . Alcohol use: Never  .  Drug use: Never    Review of Systems  Constitutional: No fever/chills Eyes: No visual changes. ENT: No sore throat. Cardiovascular: Positive for chest pain.  Positive for loss of consciousness. Respiratory: Denies shortness of breath. Gastrointestinal: No abdominal pain.  No nausea, no vomiting.  No diarrhea.  No constipation. Genitourinary: Negative for dysuria. Musculoskeletal: Negative for back pain. Skin: Negative for rash. Neurological: Negative for headaches, focal weakness or numbness.  Positive for shaking.  ____________________________________________   PHYSICAL EXAM:  VITAL SIGNS: ED Triage Vitals  Enc Vitals Group     BP 04/12/20 1326 (!) 145/83     Pulse Rate 04/12/20 1326 74     Resp 04/12/20 1326 14     Temp --      Temp src --      SpO2 04/12/20 1326 93 %     Weight 04/12/20 1328 (!) 363 lb 12.1 oz (165 kg)     Height 04/12/20 1328 6\' 2"  (1.88 m)     Head Circumference --      Peak Flow --      Pain Score --       Pain Loc --      Pain Edu? --      Excl. in [place]? --     Constitutional: Alert and oriented. Eyes: Conjunctivae are normal. Head: Atraumatic. Nose: No congestion/rhinnorhea. Mouth/Throat: Mucous membranes are moist. Neck: Normal ROM Cardiovascular: Normal rate, regular rhythm. Grossly normal heart sounds.  Right chest wall TDC intact.  2+ radial pulses bilaterally. Respiratory: Normal respiratory effort.  No retractions. Lungs CTAB. Gastrointestinal: Soft and nontender. No distention. Genitourinary: deferred Musculoskeletal: No lower extremity tenderness nor edema. Neurologic:  Normal speech and language. No gross focal neurologic deficits are appreciated. Skin:  Skin is warm, dry and intact. No rash noted. Psychiatric: Mood and affect are normal. Speech and behavior are normal.  ____________________________________________   LABS (all labs ordered are listed, but only abnormal results are displayed)  Labs Reviewed  CBC WITH DIFFERENTIAL/PLATELET - Abnormal; Notable for the following components:      Result Value   RBC 3.48 (*)    Hemoglobin 10.0 (*)    HCT 31.4 (*)    RDW 15.8 (*)    All other components within normal limits  COMPREHENSIVE METABOLIC PANEL - Abnormal; Notable for the following components:   Glucose, Bld 314 (*)    BUN 38 (*)    Creatinine, Ser 5.51 (*)    Calcium 7.8 (*)    Albumin 2.7 (*)    AST 11 (*)    GFR, Estimated 12 (*)    All other components within normal limits  TROPONIN I (HIGH SENSITIVITY) - Abnormal; Notable for the following components:   Troponin I (High Sensitivity) 32 (*)    All other components within normal limits  SARS CORONAVIRUS 2 (TAT 6-24 HRS)  MAGNESIUM  TROPONIN I (HIGH SENSITIVITY)   ____________________________________________  EKG  ED ECG REPORT I, Blake Divine, the attending physician, personally viewed and interpreted this ECG.   Date: 04/12/2020  EKG Time: 13:22  Rate: 76  Rhythm: normal sinus rhythm  Axis:  Normal  Intervals:Prolonged QT  ST&T Change: T wave inversions laterally, similar to previous  ED ECG REPORT I, Blake Divine, the attending physician, personally viewed and interpreted this ECG.   Date: 04/12/2020  EKG Time: 13:59  Rate: 73  Rhythm: normal sinus rhythm  Axis: Normal  Intervals:Prolonged QT  ST&T Change: Lateral T wave inversions, similar to previous  PROCEDURES  Procedure(s) performed (including Critical Care):  Procedures   ____________________________________________   INITIAL IMPRESSION / ASSESSMENT AND PLAN / ED COURSE       50 year old male with past medical history of hypertension, hyperlipidemia, diabetes, CAD, atrial fibrillation on Eliquis, CHF, and ESRD on HD ([name]) who presents to the ED with multiple episodes today of loss of consciousness along with decreased responsiveness and chest pain.  On arrival to the ED, he is awake and alert and denies any ongoing chest pain.  EKG shows no ischemic changes, T wave inversions are similar to previous, but he does have prolonged QT which could contribute to syncope.  We will interrogate his pacemaker.  Troponin mildly elevated but improved from previous, likely near his baseline given ESRD.  Seizure episode seems less likely given some focal shaking with no postictal state.  CT head was performed and negative for acute process.  Electrolytes are unremarkable.  Case discussed with cardiology for consultation and also with hospitalist for admission.      ____________________________________________   FINAL CLINICAL IMPRESSION(S) / ED DIAGNOSES  Final diagnoses:  Syncope, unspecified syncope type  Chest pain, unspecified type     ED Discharge Orders    None       Note:  This document was prepared using Dragon voice recognition software and [place] include unintentional dictation errors.   Blake Divine, MD 04/12/20 [phone]

## 2020-04-12 NOTE — ED Notes (Signed)
Pt used call light, reports "trouble breathing." RN assessment reveals O2 sats @100 % on room air, resp rate 16, HR 70 with no arrythmia noted. Placed Morton on pt @ 2L; notified Dr. Charna Archer.

## 2020-04-12 NOTE — H&P (Signed)
History and Physical    Edwin [name] QIH:[id number] DOB: January 31, 1971 DOA: 04/12/2020  Referring MD/NP/PA:   PCP: Caryl Never, MD   Patient coming from:  The patient is coming from home.  At baseline, pt is independent for most of ADL.        Chief Complaint: possible syncope  HPI: Edwin [name] is a 50 y.o. male with medical history significant of ESRD-HD, HTN, HLD, DM, PAF not on AC, CAD, stent, ICD, sCHF, gout, depression, presents with possible syncope.   Pt states that at about 11:30 AM, he had nausea and vomiting, and then blacked out. Per EMS report, pt seems have had seizure-like activity.  Patient had another episode in the ED, but per ED physician, no seizure activity noted. Pt reports he has a pacemaker and this episode felt like his heart rate went down. This has happened before. Pt reports he was aware of the event the whole time; no loss of bowel or bladder. Currently patient does not have unilateral numbness or tingling to extremities.  No facial droop or slurred speech.  Patient denies chest pain, shortness breath, cough, fever or chills.  No nausea, vomiting, diarrhea, abdomen pain, symptoms of UTI or unilateral weakness.  Patient had a dialysis yesterday.  Patient states that he has chronic left foot drop.  ED Course: pt was found to have troponin of 32 -->43, pending COVID-19 PCR, bicarbonate 25, potassium of 5.0, creatinine 5.51, BUN 38.  Blood pressure 148/74, heart rate of 73, RR 5, oxygen saturation 91--100% on 2 L oxygen.  Chest x-ray showed cardiomegaly, vascular congestion.  CT head is negative for acute intracranial abnormalities.  Patient is place in progressive bed for observation.  Dr. Ubaldo Glassing of card and Dr. Candiss Norse of nephrology are consulted.  Review of Systems:   General: no fevers, chills, no body weight gain, has fatigue HEENT: no blurry vision, hearing changes or sore throat Respiratory: no dyspnea, coughing, wheezing CV: no chest pain, no  palpitations GI: no nausea, vomiting, abdominal pain, diarrhea, constipation GU: no dysuria, burning on urination, increased urinary frequency, hematuria  Ext: no leg edema Neuro: no unilateral weakness, numbness, or tingling, no vision change or hearing loss.  Has possible syncope.  Has chronic left foot drop. Skin: no rash, no skin tear. MSK: No muscle spasm, no deformity, no limitation of range of movement in spin Heme: No easy bruising.  Travel history: No recent long distant travel.  Allergy:  Allergies  Allergen Reactions  . Losartan     Hyperkalemia (K>6)  . Morphine Anxiety  . Morphine And Related Other (See Comments)    Overdose in hospital per patient report    Past Medical History:  Diagnosis Date  . CAD (coronary artery disease)   . Chronic systolic heart failure ([place])   . Diabetes mellitus without complication ([place])   . Heart attack ([place])   . Hyperlipidemia   . Hypertension   . Ischemic cardiomyopathy   . Paroxysmal atrial fibrillation ([name])   . Renal disorder     Past Surgical History:  Procedure Laterality Date  . [employer] pacemaker    . CARDIAC SURGERY    . IRRIGATION AND DEBRIDEMENT KNEE Left 12/17/2017   Procedure: LEFT KNEE DEBRIDEMENT AND SYNOVECTOMY;  Surgeon: Thornton Park, MD;  Location: [facility];  Service: Orthopedics;  Laterality: Left;  . JOINT REPLACEMENT    . LEFT HEART CATH AND CORONARY ANGIOGRAPHY Left 03/09/2018   Procedure: LEFT HEART CATH AND CORONARY  ANGIOGRAPHY;  Surgeon: Nelva Bush, MD;  Location: [facility];  Service: Cardiovascular;  Laterality: Left;  . LEFT HEART CATH AND CORONARY ANGIOGRAPHY N/A 01/25/2020   Procedure: LEFT HEART CATH AND CORONARY ANGIOGRAPHY;  Surgeon: Isaias Cowman, MD;  Location: [facility];  Service: Cardiovascular;  Laterality: N/A;  . PERCUTANEOUS CORONARY STENT INTERVENTION (PCI-S)      Social History:  reports that he has quit smoking. His smokeless tobacco use  includes snuff. He reports that he does not drink alcohol and does not use drugs.  Family History:  Family History  Problem Relation Age of Onset  . Diabetes Mother   . Peripheral vascular disease Mother   . COPD Father      Prior to Admission medications   Medication Sig Start Date End Date Taking? Authorizing Provider  albuterol (PROVENTIL HFA;VENTOLIN HFA) 108 (90 Base) MCG/ACT inhaler Inhale 2 puffs into the lungs every 6 (six) hours as needed for wheezing or shortness of breath. 04/29/18   Schuyler Amor, MD  allopurinol (ZYLOPRIM) 100 MG tablet Take 100 mg by mouth 2 (two) times daily.  03/18/18   [provider]  amiodarone (NEXTERONE PREMIX) 360-4.14 MG/200ML-% SOLN Inject 30 mg/hr into the vein continuous. 01/25/20   Loletha Grayer, MD  amLODipine (NORVASC) 10 MG tablet Take 10 mg by mouth daily. 09/30/18   [provider]  aspirin 81 MG chewable tablet Chew 1 tablet (81 mg total) by mouth before cath procedure. 01/26/20   Loletha Grayer, MD  atorvastatin (LIPITOR) 80 MG tablet Take 80 mg by mouth every evening.  09/14/17   [provider]  bumetanide (BUMEX) 2 MG tablet Take 2 mg by mouth 2 (two) times daily. 12/06/19   [provider]  cholecalciferol (VITAMIN D) 1000 units tablet Take 5,000 Units by mouth daily.    [provider]  ezetimibe (ZETIA) 10 MG tablet Take 10 mg by mouth daily. 10/14/17   [provider]  FLUoxetine (PROZAC) 20 MG capsule Take 60 mg by mouth at bedtime.  12/04/17   [provider]  gabapentin (NEURONTIN) 300 MG capsule Take 300 mg by mouth 2 (two) times a day. 09/27/18   [provider]  heparin 25000-0.45 UT/250ML-% infusion Inject 2,400 Units/hr into the vein continuous. 01/25/20   Loletha Grayer, MD  insulin aspart (NOVOLOG) 100 UNIT/ML injection Inject 20 Units into the skin 3 (three) times daily with meals. 01/25/20   Loletha Grayer, MD  insulin detemir (LEVEMIR) 100 UNIT/ML  injection Inject 0.4 mLs (40 Units total) into the skin at bedtime. 01/25/20   Loletha Grayer, MD  isosorbide mononitrate (IMDUR) 30 MG 24 hr tablet Take 1 tablet (30 mg total) by mouth daily. 10/30/18   Vaughan Basta, MD  metoprolol succinate (TOPROL-XL) 100 MG 24 hr tablet Take 100 mg by mouth daily. 01/03/20   [provider]  Multiple Vitamin (MULTIVITAMIN WITH MINERALS) TABS tablet Take 1 tablet by mouth daily. 10/31/18   Vaughan Basta, MD  nitroGLYCERIN (NITROSTAT) 0.4 MG SL tablet Place 0.4 mg under the tongue every 5 (five) minutes as needed for chest pain.    [provider]  omeprazole (PRILOSEC) 40 MG capsule Take 40 mg by mouth 2 (two) times daily. 11/25/19   [provider]  ondansetron (ZOFRAN) 4 MG tablet Take 4 mg by mouth every 12 (twelve) hours as needed for nausea. 01/04/20   [provider]  RENVELA 800 MG tablet Take 1,600 mg by mouth 3 (three)  times daily with meals. 10/14/17   [provider]  VOLTAREN 1 % GEL Apply 2 g topically 3 (three) times daily as needed (left leg pain).  12/04/17   [provider]    Physical Exam: Vitals:   04/12/20 1500 04/12/20 1530 04/12/20 1600 04/12/20 1700  BP: (!) 148/74 140/76 (!) 149/74 (!) 159/80  Pulse: 73 73 71 70  Resp: (!) 5 (!) 27 17 19   Temp:   98.2 F (36.8 C)   TempSrc:   Oral   SpO2: 100% 99% 99% 100%  Weight:      Height:       General: Not in acute distress HEENT:       Eyes: PERRL, EOMI, no scleral icterus.       ENT: No discharge from the ears and nose, no pharynx injection, no tonsillar enlargement.        Neck: No JVD, no bruit, no mass felt. Heme: No neck lymph node enlargement. Cardiac: S1/S2, RRR, No murmurs, No gallops or rubs. Respiratory: No rales, wheezing, rhonchi or rubs. GI: Soft, nondistended, nontender, no rebound pain, no organomegaly, BS present. GU: No hematuria Ext: No pitting leg edema bilaterally. 2+DP/PT pulse  bilaterally. Musculoskeletal: No joint deformities, No joint redness or warmth, no limitation of ROM in spin. Skin: No rashes.  Neuro: Alert, oriented X3, cranial nerves II-XII grossly intact, moves all extremities normally. Psych: Patient is not psychotic, no suicidal or hemocidal ideation.  Labs on Admission: I have personally reviewed following labs and imaging studies  CBC: Recent Labs  Lab 04/12/20 1332  WBC 6.8  NEUTROABS 5.2  HGB 10.0*  HCT 31.4*  MCV 90.2  PLT 235   Basic Metabolic Panel: Recent Labs  Lab 04/12/20 1332  NA 137  K 5.0  CL 101  CO2 25  GLUCOSE 314*  BUN 38*  CREATININE 5.51*  CALCIUM 7.8*  MG 2.1   GFR: Estimated Creatinine Clearance: 25.9 mL/min (A) (by C-G formula based on SCr of 5.51 mg/dL (H)). Liver Function Tests: Recent Labs  Lab 04/12/20 1332  AST 11*  ALT 10  ALKPHOS 87  BILITOT 0.6  PROT 6.6  ALBUMIN 2.7*   No results for input(s): LIPASE, AMYLASE in the last 168 hours. No results for input(s): AMMONIA in the last 168 hours. Coagulation Profile: No results for input(s): INR, PROTIME in the last 168 hours. Cardiac Enzymes: No results for input(s): CKTOTAL, CKMB, CKMBINDEX, TROPONINI in the last 168 hours. BNP (last 3 results) No results for input(s): PROBNP in the last 8760 hours. HbA1C: No results for input(s): HGBA1C in the last 72 hours. CBG: Recent Labs  Lab 04/12/20 1612  GLUCAP 184*   Lipid Profile: No results for input(s): CHOL, HDL, LDLCALC, TRIG, CHOLHDL, LDLDIRECT in the last 72 hours. Thyroid Function Tests: No results for input(s): TSH, T4TOTAL, FREET4, T3FREE, THYROIDAB in the last 72 hours. Anemia Panel: No results for input(s): VITAMINB12, FOLATE, FERRITIN, TIBC, IRON, RETICCTPCT in the last 72 hours. Urine analysis:    Component Value Date/Time   COLORURINE STRAW (A) 10/28/2018 2145   APPEARANCEUR CLEAR (A) 10/28/2018 2145   LABSPEC 1.008 10/28/2018 2145   PHURINE 5.0 10/28/2018 2145   GLUCOSEU  50 (A) 10/28/2018 2145   HGBUR SMALL (A) 10/28/2018 2145   BILIRUBINUR NEGATIVE 10/28/2018 2145   [place] NEGATIVE 10/28/2018 2145   PROTEINUR 100 (A) 10/28/2018 2145   NITRITE NEGATIVE 10/28/2018 2145   LEUKOCYTESUR NEGATIVE 10/28/2018 2145   Sepsis Labs: @LABRCNTIP (procalcitonin:4,lacticidven:4) )No results found  for this or any previous visit (from the past 240 hour(s)).   Radiological Exams on Admission: CT Head Wo Contrast  Result Date: 04/12/2020 CLINICAL DATA:  50 year old male with a history mental status changes EXAM: CT HEAD WITHOUT CONTRAST TECHNIQUE: Contiguous axial images were obtained from the base of the skull through the vertex without intravenous contrast. COMPARISON:  07/08/2018 FINDINGS: Brain: No acute intracranial hemorrhage. No midline shift or mass effect. Gray-white differentiation maintained. Unremarkable appearance of the ventricular system. Vascular: Unremarkable. Skull: No acute fracture.  No aggressive bone lesion identified. 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 Unremarkable paranasal sinuses which are clear. No mastoid effusion. Other: None IMPRESSION: Negative for acute intracranial abnormality. Interval development of bilateral exophthalmos, which can be seen with thyroid disease. Correlation with thyroid labs recommended. Electronically Signed   By: Corrie Mckusick D.O.   On: 04/12/2020 14:55   DG Chest Portable 1 View  Result Date: 04/12/2020 CLINICAL DATA:  Seizure activity, vomiting, history pacemaker, coronary artery disease, chronic systolic heart failure, diabetes mellitus, hypertension, ischemic cardiomyopathy, paroxysmal atrial fibrillation EXAM: PORTABLE CHEST 1 VIEW COMPARISON:  Portable exam 1345 hours compared to 01/23/2020 FINDINGS: RIGHT jugular dual-lumen central venous catheter with tips projecting over RIGHT atrium. LEFT subclavian AICD with leads  projecting over RIGHT atrium and RIGHT ventricle. Enlargement of cardiac silhouette with vascular congestion. Stable mediastinal contours. Decreased lung volumes without gross infiltrate, pleural effusion or pneumothorax. Osseous structures unremarkable. IMPRESSION: Enlargement of cardiac silhouette with pulmonary vascular congestion post ICD. No acute abnormalities. Electronically Signed   By: Lavonia Dana M.D.   On: 04/12/2020 13:57     EKG: I have personally reviewed.  Sinus rhythm, QTC 521, T wave inversion in lateral leads.  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  CT head is negative for acute intracranial abnormalities.  No focal deficits on physical examination.  Patient has  Pacemaker/ ICD, can only do MRI of her brain.  Cardiology, Dr. Ubaldo Glassing is consulted.  -Placed on progressive plan for observation -Check 2D echo -EEG -Orthostatic vital signs -IV fluid: 500 cc normal saline was given  CAD (coronary artery disease) and elevated troponin: trop 32 -->43. No CP.  [place] be due to decreased clearance secondary to ESRD.  Dr. Ubaldo Glassing of cardiology is consulted -Trend troponin -Continue aspirin, Zetia, Lipitor, Imdur, metoprolol, as needed nitroglycerin -Check A1c, FLP -Follow-up 2D echo  ESRD (end stage renal disease) ([name]) -dr. Candiss Norse is consulted  Type 2 diabetes mellitus with ESRD (end-stage renal disease) ([place]): Recent A1c 8.1, poorly controlled.  Patient taking NovoLog and Levemir at home -SSI -Decrease Levemir dose from 40 to 30 units daily  Hyperlipidemia -Zetia and  Lipitor  Chronic systolic CHF (congestive heart failure) ([place]): 2D echo on 01/14/2020 showed EF of 45-50%.  Patient does not have leg edema.  No respiratory distress.  CHF seems to be compensated. -Volume management per renal by dialysis  Atrial fibrillation, chronic ([name]): Not taking anticoagulants -Continue metoprolol  GERD (gastroesophageal reflux disease) -Protonix  Gout -Allopurinol  Depression -Continue home  medications     DVT ppx: SQ Heparin      Code Status: Full code Family Communication: not done, no family member is at bed side.    Disposition Plan:  Anticipate discharge back to previous environment Consults called:  Dr. Ubaldo Glassing of card and Dr. Candiss Norse of nephrology are consulted. Admission status:  progressive unit for obs        Date of Service 04/12/2020    [facility]   If 7PM-7AM, please contact night-coverage [email or web address] 04/12/2020, 6:43 PM

## 2020-04-12 NOTE — ED Triage Notes (Signed)
Per EMS call came in as cardiac arrest; EMS arrived to find what they believe was seizure activity; pt was responsive, vomiting; given Zofran and NS 573ml en route. Pt reports he has a pacemaker and this episode felt like his heart rate went down, this has happened before. Pt reports he was aware of the event the whole time; no loss of bowel or bladder

## 2020-04-12 NOTE — ED Notes (Signed)
Patient transported to CT 

## 2020-04-13 DIAGNOSIS — R55 Syncope and collapse: Secondary | ICD-10-CM | POA: Diagnosis not present

## 2020-04-13 LAB — CBG MONITORING, ED: Glucose-Capillary: 195 mg/dL — ABNORMAL HIGH (ref 70–99)

## 2020-04-13 LAB — CBC
HCT: 31.4 % — ABNORMAL LOW (ref 39.0–52.0)
Hemoglobin: 10 g/dL — ABNORMAL LOW (ref 13.0–17.0)
MCH: 29.1 pg (ref 26.0–34.0)
MCHC: 31.8 g/dL (ref 30.0–36.0)
MCV: 91.3 fL (ref 80.0–100.0)
Platelets: 212 10*3/uL (ref 150–400)
RBC: 3.44 MIL/uL — ABNORMAL LOW (ref 4.22–5.81)
RDW: 15.7 % — ABNORMAL HIGH (ref 11.5–15.5)
WBC: 8.4 10*3/uL (ref 4.0–10.5)
nRBC: 0 % (ref 0.0–0.2)

## 2020-04-13 LAB — BASIC METABOLIC PANEL
Anion gap: 8 (ref 5–15)
BUN: 40 mg/dL — ABNORMAL HIGH (ref 6–20)
CO2: 24 mmol/L (ref 22–32)
Calcium: 8.1 mg/dL — ABNORMAL LOW (ref 8.9–10.3)
Chloride: 105 mmol/L (ref 98–111)
Creatinine, Ser: 5.76 mg/dL — ABNORMAL HIGH (ref 0.61–1.24)
GFR, Estimated: 11 mL/min — ABNORMAL LOW (ref >60)
Glucose, Bld: 182 mg/dL — ABNORMAL HIGH (ref 70–99)
Potassium: 5.3 mmol/L — ABNORMAL HIGH (ref 3.5–5.1)
Sodium: 137 mmol/L (ref 135–145)

## 2020-04-13 LAB — LIPID PANEL
Cholesterol: 100 mg/dL (ref 0–200)
HDL: 44 mg/dL (ref >40)
LDL Cholesterol: 33 mg/dL (ref 0–99)
Total CHOL/HDL Ratio: 2.3 RATIO
Triglycerides: 116 mg/dL (ref <150)
VLDL: 23 mg/dL (ref 0–40)

## 2020-04-13 LAB — SARS CORONAVIRUS 2 (TAT 6-24 HRS): SARS Coronavirus 2: NEGATIVE

## 2020-04-13 LAB — TROPONIN I (HIGH SENSITIVITY)
Troponin I (High Sensitivity): 47 ng/L — ABNORMAL HIGH (ref <18)
Troponin I (High Sensitivity): 42 ng/L — ABNORMAL HIGH (ref <18)

## 2020-04-13 MED ORDER — AMIODARONE 200 MG TABLET
ORAL_TABLET | 1 refills | 0 days | Status: SS
Start: 2020-04-13 — End: ?

## 2020-04-13 MED ORDER — SODIUM ZIRCONIUM CYCLOSILICATE 10 G PO PACK
10.0000 g | PACK | Freq: Once | ORAL | Status: AC
  Administered 2020-04-13: 10 g via ORAL
  Filled 2020-04-13: qty 1

## 2020-04-13 MED ORDER — CHLORHEXIDINE GLUCONATE CLOTH 2 % EX PADS
6.0000 | MEDICATED_PAD | Freq: Every day | CUTANEOUS | Status: DC
  Filled 2020-04-13: qty 6

## 2020-04-13 MED ORDER — AMIODARONE HCL 200 MG PO TABS: 200.0000 mg | ORAL_TABLET | Freq: Two times a day (BID) | ORAL | 1 refills | Status: AC

## 2020-04-13 NOTE — ED Notes (Signed)
Patient called out to ask about when he would go to dialysis. Advised him that they do not appointments here and we would get to him at some point today. Readjusted 02 sensor on finger.

## 2020-04-13 NOTE — Consult Note (Signed)
[address] [place], [place] [address] Phone [phone]. Fax [phone]  Date: 04/13/2020                  Patient Name:  Edwin [name]  MRN: [id number]  DOB: 1971-01-04  Age / Sex: 50 y.o., male         PCP: Caryl Never, MD                 Service Requesting Consult: IM/ No att. providers found                 Reason for Consult: ARF            History of Present Illness: Patient is a 50 y.o. male with medical problems of ESRD, who was admitted to [facility] on 04/12/2020 for evaluation of Syncope [R55]   Patient presents for complaints of nausea, vomiting and then blacking out.  Tallent be seizure-like activity.  Some fluctuation in his heart rate was noted.  No loss of bowel or bladder function.  He reports some shortness of breath with fluctuation of heart rate. He has significant coronary disease and atrial fibrillation.  He has had angioplasty and stent in the past.  He was evaluated by cardiologist.  Recommended to continue dual antiplatelet therapy.  Nephrology consult has now been requested to evaluate for need of dialysis. Patient reports he has good residual renal function and urine output His last dialysis was Wednesday He denies any shortness of breath or leg edema Able to eat without nausea or vomiting Currently on room air  Medications: Outpatient medications: (Not in a hospital admission)   Current medications: Current Facility-Administered Medications  Medication Dose Route Frequency Provider Last Rate Last Admin  . acetaminophen (TYLENOL) tablet 650 mg  650 mg Oral Q6H PRN Ivor Costa, MD      . albuterol (VENTOLIN HFA) 108 (90 Base) MCG/ACT inhaler 2 puff  2 puff Inhalation Q4H PRN Ivor Costa, MD      . allopurinol (ZYLOPRIM) tablet 100 mg  100 mg Oral BID Ivor Costa, MD   100 mg at 04/13/20 1443  . amiodarone (PACERONE) tablet 200 mg  200 mg Oral BID Teodoro Spray, MD   200 mg at 04/13/20 1540  . amLODipine (NORVASC) tablet 10 mg  10 mg  Oral Daily Ivor Costa, MD   10 mg at 04/13/20 0867  . apixaban (ELIQUIS) tablet 5 mg  5 mg Oral BID Teodoro Spray, MD   5 mg at 04/13/20 6195  . aspirin chewable tablet 81 mg  81 mg Oral Gerre Scull, MD   81 mg at 04/13/20 0549  . atorvastatin (LIPITOR) tablet 80 mg  80 mg Oral q1800 Teodoro Spray, MD   80 mg at 04/12/20 2211  . bumetanide (BUMEX) tablet 2 mg  2 mg Oral BID Teodoro Spray, MD   2 mg at 04/13/20 0939  . Chlorhexidine Gluconate Cloth 2 % PADS 6 each  6 each Topical Q0600 Murlean Iba, MD      . cholecalciferol (VITAMIN D) tablet 5,000 Units  5,000 Units Oral Daily Ivor Costa, MD   5,000 Units at 04/13/20 [phone]  . diclofenac Sodium (VOLTAREN) 1 % topical gel 2 g  2 g Topical TID PRN Ivor Costa, MD      . ezetimibe (ZETIA) tablet 10 mg  10 mg Oral q1800 Teodoro Spray, MD      . FLUoxetine (PROZAC) capsule 60 mg  60  mg Oral Gordan Payment, MD   60 mg at 04/12/20 2212  . gabapentin (NEURONTIN) capsule 300 mg  300 mg Oral BID Ivor Costa, MD   300 mg at 04/13/20 4034  . hydrALAZINE (APRESOLINE) injection 5 mg  5 mg Intravenous Q2H PRN Ivor Costa, MD      . insulin aspart (novoLOG) injection 0-5 Units  0-5 Units Subcutaneous QHS Ivor Costa, MD      . insulin aspart (novoLOG) injection 0-9 Units  0-9 Units Subcutaneous TID WC Ivor Costa, MD   3 Units at 04/13/20 [phone]  . insulin detemir (LEVEMIR) injection 30 Units  30 Units Subcutaneous QHS Ivor Costa, MD   30 Units at 04/13/20 0055  . isosorbide mononitrate (IMDUR) 24 hr tablet 30 mg  30 mg Oral Daily Ivor Costa, MD   30 mg at 04/13/20 9563  . metoprolol succinate (TOPROL-XL) 24 hr tablet 100 mg  100 mg Oral Daily Teodoro Spray, MD   100 mg at 04/13/20 8756  . multivitamin with minerals tablet 1 tablet  1 tablet Oral Daily Ivor Costa, MD   1 tablet at 04/13/20 0936  . pantoprazole (PROTONIX) EC tablet 40 mg  40 mg Oral Daily Ivor Costa, MD   40 mg at 04/13/20 0937  . ranolazine (RANEXA) 12 hr tablet 500 mg  500 mg Oral  BID Teodoro Spray, MD   500 mg at 04/13/20 4332  . sevelamer carbonate (RENVELA) tablet 1,600 mg  1,600 mg Oral TID WC Ivor Costa, MD   1,600 mg at 04/13/20 1447  . ticagrelor (BRILINTA) tablet 90 mg  90 mg Oral BID Teodoro Spray, MD   90 mg at 04/13/20 9518   Current Outpatient Medications  Medication Sig Dispense Refill  . acetaminophen (TYLENOL) 500 MG tablet Take 500 mg by mouth every 6 (six) hours as needed.    Marland Kitchen albuterol (PROVENTIL HFA;VENTOLIN HFA) 108 (90 Base) MCG/ACT inhaler Inhale 2 puffs into the lungs every 6 (six) hours as needed for wheezing or shortness of breath. 1 Inhaler 2  . Alirocumab 150 MG/ML SOAJ Inject 150 mg as directed every 14 (fourteen) days.    Marland Kitchen allopurinol (ZYLOPRIM) 100 MG tablet Take 200 mg by mouth daily.    Marland Kitchen apixaban (ELIQUIS) 5 MG TABS tablet Take 5 mg by mouth 2 (two) times daily.    Marland Kitchen atorvastatin (LIPITOR) 80 MG tablet Take 80 mg by mouth every evening.     . bumetanide (BUMEX) 2 MG tablet Take 2 mg by mouth 2 (two) times daily.    . cholecalciferol (VITAMIN D) 1000 units tablet Take 1,000 Units by mouth daily.    Marland Kitchen ezetimibe (ZETIA) 10 MG tablet Take 10 mg by mouth daily.    Marland Kitchen FLUoxetine (PROZAC) 40 MG capsule Take 80 mg by mouth daily.    Marland Kitchen gabapentin (NEURONTIN) 300 MG capsule Take 300 mg by mouth 2 (two) times a day.    . insulin aspart (NOVOLOG) 100 UNIT/ML injection Inject 20 Units into the skin 3 (three) times daily with meals. (Patient taking differently: Inject 15 Units into the skin 3 (three) times daily with meals.)    . insulin detemir (LEVEMIR) 100 UNIT/ML injection Inject 0.4 mLs (40 Units total) into the skin at bedtime. (Patient taking differently: Inject 30 Units into the skin at bedtime.)    . isosorbide mononitrate (IMDUR) 30 MG 24 hr tablet Take 1 tablet (30 mg total) by mouth daily. (Patient taking differently: Take 60 mg by  mouth daily. Do not take on dialysis days) 30 tablet 0  . loperamide (IMODIUM) 2 MG capsule Take 2 mg by  mouth as needed for diarrhea or loose stools.    . metoprolol succinate (TOPROL-XL) 100 MG 24 hr tablet Take 100 mg by mouth daily.    . mirtazapine (REMERON) 7.5 MG tablet Take 7.5 mg by mouth at bedtime.    . nitroGLYCERIN (NITROSTAT) 0.4 MG SL tablet Place 0.4 mg under the tongue every 5 (five) minutes as needed for chest pain.    Marland Kitchen omeprazole (PRILOSEC) 40 MG capsule Take 40 mg by mouth 2 (two) times daily.    . ondansetron (ZOFRAN) 4 MG tablet Take 4 mg by mouth every 12 (twelve) hours as needed for nausea.    . ranolazine (RANEXA) 500 MG 12 hr tablet Take 500 mg by mouth 2 (two) times daily.    Marland Kitchen RENVELA 800 MG tablet Take 2,400 mg by mouth 3 (three) times daily with meals.    . Semaglutide,0.25 or 0.5MG /DOS, (OZEMPIC, 0.25 OR 0.5 MG/DOSE,) 2 MG/1.5ML SOPN Inject 0.5 mg into the skin once a week.    . ticagrelor (BRILINTA) 90 MG TABS tablet Take 90 mg by mouth 2 (two) times daily.    . VOLTAREN 1 % GEL Apply 2 g topically 3 (three) times daily as needed (left leg pain).     Marland Kitchen amiodarone (PACERONE) 200 MG tablet Take 1 tablet (200 mg total) by mouth 2 (two) times daily. 60 tablet 1  . amLODipine (NORVASC) 10 MG tablet Take 10 mg by mouth daily. (Patient not taking: Reported on 04/12/2020)    . Multiple Vitamin (MULTIVITAMIN WITH MINERALS) TABS tablet Take 1 tablet by mouth daily. 30 tablet 0      Allergies: Allergies  Allergen Reactions  . Losartan     Hyperkalemia (K>6)  . Morphine Anxiety  . Morphine And Related Other (See Comments)    Overdose in hospital per patient report      Past Medical History: Past Medical History:  Diagnosis Date  . CAD (coronary artery disease)   . Chronic systolic heart failure ([place])   . Diabetes mellitus without complication ([place])   . Heart attack ([place])   . Hyperlipidemia   . Hypertension   . Ischemic cardiomyopathy   . Paroxysmal atrial fibrillation ([name])   . Renal disorder      Past Surgical History: Past Surgical History:  Procedure  Laterality Date  . [employer] pacemaker    . CARDIAC SURGERY    . IRRIGATION AND DEBRIDEMENT KNEE Left 12/17/2017   Procedure: LEFT KNEE DEBRIDEMENT AND SYNOVECTOMY;  Surgeon: Thornton Park, MD;  Location: [facility] ORS;  Service: Orthopedics;  Laterality: Left;  . JOINT REPLACEMENT    . LEFT HEART CATH AND CORONARY ANGIOGRAPHY Left 03/09/2018   Procedure: LEFT HEART CATH AND CORONARY ANGIOGRAPHY;  Surgeon: Nelva Bush, MD;  Location: [place] [facility];  Service: Cardiovascular;  Laterality: Left;  . LEFT HEART CATH AND CORONARY ANGIOGRAPHY N/A 01/25/2020   Procedure: LEFT HEART CATH AND CORONARY ANGIOGRAPHY;  Surgeon: Isaias Cowman, MD;  Location: [place] [facility];  Service: Cardiovascular;  Laterality: N/A;  . PERCUTANEOUS CORONARY STENT INTERVENTION (PCI-S)       Family History: Family History  Problem Relation Age of Onset  . Diabetes Mother   . Peripheral vascular disease Mother   . COPD Father      Social History: Social History   Socioeconomic History  . Marital status: Married  Spouse name: Not on file  . Number of children: Not on file  . Years of education: Not on file  . Highest education level: Not on file  Occupational History  . Not on file  Tobacco Use  . 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 Smokeless tobacco: Current User    Types: Snuff  Vaping Use  . Vaping Use: Never used  Substance and Sexual Activity  . Alcohol use: Never  . Drug use: Never  . Sexual activity: Not Currently    Birth control/protection: None  Other Topics Concern  . Not on file  Social History Narrative  . Not on file   Social Determinants of Health   Financial Resource Strain: Not on file  Food Insecurity: Not on file  Transportation Needs: Not on file  Physical Activity: Not on file  Stress: Not on file  Social Connections: Not on file  Intimate Partner Violence: Not on file     Review of Systems: Gen: Denies fevers or chills HEENT: No vision  or hearing complaints CV: As described above Resp: No cough or sputum production GI: Appetite has been good.  No nausea or vomiting reported GU : Good residual function.  Denies hematuria MS: No complaints Derm:    No complaints Psych: No complaints Heme: No complaints Neuro: No complaints Endocrine.  No complaints  Vital Signs: Blood pressure 140/82, pulse 75, temperature 98 F (36.7 C), resp. rate 18, height 6\' 2"  (1.88 m), weight (!) 165 kg, SpO2 99 %.   Intake/Output Summary (Last 24 hours) at 04/13/2020 1505 Last data filed at 04/13/2020 0547 Gross per 24 hour  Intake --  Output 1700 ml  Net -1700 ml    Weight trends: [employer]   04/12/20 1328  Weight: (!) 165 kg   Physical Exam: General:  No acute distress, laying in the bed, obese gentleman  HEENT  anicteric, moist oral mucous membrane  Pulm/lungs  normal breathing effort, lungs are clear to auscultation  CVS/Heart  regular rhythm, no rub or gallop  Abdomen:   Soft, nontender  Extremities:  No peripheral edema  Neurologic:  Alert, oriented, able to follow commands  Skin:  No acute rashes  IJ PermCath   Lab results: Basic Metabolic Panel: Recent Labs  Lab 04/12/20 1332 04/13/20 0549  NA 137 137  K 5.0 5.3*  CL 101 105  CO2 25 24  GLUCOSE 314* 182*  BUN 38* 40*  CREATININE 5.51* 5.76*  CALCIUM 7.8* 8.1*  MG 2.1  --     Liver Function Tests: Recent Labs  Lab 04/12/20 1332  AST 11*  ALT 10  ALKPHOS 87  BILITOT 0.6  PROT 6.6  ALBUMIN 2.7*   No results for input(s): LIPASE, AMYLASE in the last 168 hours. No results for input(s): AMMONIA in the last 168 hours.  CBC: Recent Labs  Lab 04/12/20 1332 04/13/20 0549  WBC 6.8 8.4  NEUTROABS 5.2  --   HGB 10.0* 10.0*  HCT 31.4* 31.4*  MCV 90.2 91.3  PLT 214 212    Cardiac Enzymes: No results for input(s): CKTOTAL, TROPONINI in the last 168 hours.  BNP: Invalid input(s): POCBNP  CBG: Recent Labs  Lab 04/12/20 1612 04/12/20 2218  04/13/20 0800  GLUCAP 184* 166* 195*    Microbiology: Recent Results (from the past 720 hour(s))  SARS CORONAVIRUS 2 (TAT 6-24 HRS) Nasopharyngeal Nasopharyngeal Swab     Status: None   Collection Time: 04/12/20  2:54 PM   Specimen: Nasopharyngeal Swab  Result Value Ref Range Status   SARS Coronavirus 2 NEGATIVE NEGATIVE Final    Comment: (NOTE) SARS-CoV-2 target nucleic acids are NOT DETECTED.  The SARS-CoV-2 RNA is generally detectable in upper and lower respiratory specimens during the acute phase of infection. Negative results do not preclude SARS-CoV-2 infection, do not rule out co-infections with other pathogens, and should not be used as the sole basis for treatment or other patient management decisions. Negative results must be combined with clinical observations, patient history, and epidemiological information. The expected result is Negative.  Fact Sheet for Patients: SugarRoll.be  Fact Sheet for Healthcare Providers: https://www.woods-mathews.com/  This test is not yet approved or cleared by the [place] FDA and  has been authorized for detection and/or diagnosis of SARS-CoV-2 by FDA under an Emergency Use Authorization (EUA). This EUA will remain  in effect (meaning this test can be used) for the duration of the COVID-19 declaration under Se ction 564(b)(1) of the Act, 21 U.S.C. section 360bbb-3(b)(1), unless the authorization is terminated or revoked sooner.  Performed at [facility], [place] [address], [place], [place] [address]      Coagulation Studies: No results for input(s): LABPROT, INR in the last 72 hours.  Urinalysis: No results for input(s): COLORURINE, LABSPEC, PHURINE, GLUCOSEU, HGBUR, BILIRUBINUR, KETONESUR, PROTEINUR, UROBILINOGEN, NITRITE, LEUKOCYTESUR in the last 72 hours.  Invalid input(s): APPERANCEUR      Imaging: CT Head Wo Contrast  Result Date: 04/12/2020 CLINICAL DATA:   50 year old male with a history mental status changes EXAM: CT HEAD WITHOUT CONTRAST TECHNIQUE: Contiguous axial images were obtained from the base of the skull through the vertex without intravenous contrast. COMPARISON:  07/08/2018 FINDINGS: Brain: No acute intracranial hemorrhage. No midline shift or mass effect. Gray-white differentiation maintained. Unremarkable appearance of the ventricular system. Vascular: Unremarkable. Skull: No acute fracture.  No aggressive bone lesion identified. 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 Unremarkable paranasal sinuses which are clear. No mastoid effusion. Other: None IMPRESSION: Negative for acute intracranial abnormality. Interval development of bilateral exophthalmos, which can be seen with thyroid disease. Correlation with thyroid labs recommended. Electronically Signed   By: Corrie Mckusick D.O.   On: 04/12/2020 14:55   DG Chest Portable 1 View  Result Date: 04/12/2020 CLINICAL DATA:  Seizure activity, vomiting, history pacemaker, coronary artery disease, chronic systolic heart failure, diabetes mellitus, hypertension, ischemic cardiomyopathy, paroxysmal atrial fibrillation EXAM: PORTABLE CHEST 1 VIEW COMPARISON:  Portable exam 1345 hours compared to 01/23/2020 FINDINGS: RIGHT jugular dual-lumen central venous catheter with tips projecting over RIGHT atrium. LEFT subclavian AICD with leads projecting over RIGHT atrium and RIGHT ventricle. Enlargement of cardiac silhouette with vascular congestion. Stable mediastinal contours. Decreased lung volumes without gross infiltrate, pleural effusion or pneumothorax. Osseous structures unremarkable. IMPRESSION: Enlargement of cardiac silhouette with pulmonary vascular congestion post ICD. No acute abnormalities. Electronically Signed   By: Lavonia Dana M.D.   On: 04/12/2020 13:57     Assessment & Plan: Pt is a 50 y.o.   male with  end-stage renal disease on hemodialysis since 2021, hypertension, hyperlipidemia, diabetes, atrial fibrillation, coronary artery disease with angioplasty and stent, AICD, systolic CHF, gout, depression, was admitted on 04/12/2020 with Syncope [R55]   #Syncope, shortness of breath #End-stage renal disease #Hyperkalemia  Patient is currently under observation in the emergency room.  His electrolytes and volume status are acceptable.  He has good residual renal function.  No acute indication for dialysis  at present.  His last hemodialysis was on Wednesday.  Dialysis care coordinator contacted [facility] and patient agreed to go to dialysis tomorrow morning. Okay to discharge from renal standpoint after [facility] is given     LOS: 0 Nalany Steedley Candiss Norse 1/14/20223:05 PM    Note: This note was prepared with Dragon dictation. Any transcription errors are unintentional

## 2020-04-13 NOTE — Discharge Instructions (Signed)
Advised to follow-up with primary care physician in 1 week. Advised to continue hemodialysis as scheduled. tomorrow. Advised to follow-up with cardiology Dr. Ubaldo Glassing as scheduled. Advised to increase amiodarone 200 mg twice daily for now.

## 2020-04-13 NOTE — Discharge Summary (Signed)
Physician Discharge Summary  Edwin [name] BLT:[id number] DOB: 1970/06/19 DOA: 04/12/2020  PCP: Edwin Never, MD  Admit date: 04/12/2020 Discharge date: 04/13/2020  Admitted From:  Home.  Disposition:  Home.  Recommendations for Outpatient Follow-up:  1. Follow up with PCP in 1-2 weeks 2. Please obtain BMP/CBC in one week 3.   Advised to continue hemodialysis as scheduled. Appointment tomorrow. 4.   Advised to follow-up with cardiology Dr. Ubaldo Glassing as scheduled. 5.   Advised to increase amiodarone 200 mg twice daily for now.  Home Health: None. Equipment/Devices: None  Discharge Condition: Stable CODE STATUS:Full code Diet recommendation: Heart Healthy   Brief [facility] Course: This 50 y.o. male with medical history significant of ESRD-HD, HTN, HLD, DM, PAF not on AC, CAD with stent, AICD, sCHF, gout, depression, presents with possible syncopal episode. Patient reports he black out yesterday after having nausea and vomiting. Per EMS report, pt seems have had seizure-like activity.  Patient had another episode in the ED, but per ED physician, no seizure activity noted. Pt reports he has a pacemaker and this episode felt like his heart rate went down. This has happened before as well . Pt reports he was aware of the event the whole time; no loss of bowel or bladder. Patient denies any numbness or tingling to extremities. No facial droop or slurred speech.  Patient denies chest pain, shortness breath, cough, fever or chills.  No nausea, vomiting, diarrhea, abdomen pain, symptoms of UTI or unilateral weakness.  Patient had dialysis yesterday. Patient was found to have elevated troponins, Chest x-ray showed cardiomegaly, vascular congestion.  CT head is negative for acute intracranial abnormalities.   Cardiology and nephrology was consulted.  Cardiology recommended to increase amiodarone 200 mg twice daily.  It seems like syncopal episode was secondary to A. fib with RVR based on  interrogation of the AICD which has resolved.  Heart rate is now under control and in normal sinus rhythm.  Continue amiodarone, metoprolol and Eliquis,  Nephrology recommended patient can be discharged and can have a hemodialysis tomorrow morning,  appointment has been set up in the morning at his usual hemodialysis suit.  Patient feels better and want to be discharged.  Patient is being discharged home.   He was managed for both problems.   Discharge Diagnoses:  Principal Problem:   Syncope Active Problems:   ESRD (end stage renal disease) ([name])   CAD (coronary artery disease)   Type 2 diabetes mellitus with ESRD (end-stage renal disease) ([name])   Hyperlipidemia   Chronic systolic CHF (congestive heart failure) ([name])   Elevated troponin   Atrial fibrillation, chronic ([name])   GERD (gastroesophageal reflux disease)   Gout   Depression   Possible Syncope: Etiology is not clear.  CT head is negative for acute intracranial abnormalities.  No focal deficits  noted on physical examination.  Patient has  Pacemaker/ AICD, can not do MRI of her brain.  Cardiology, Dr. Ubaldo Glassing is consulted.  Recommended to increase amiodarone 200 mg twice daily. It seems like syncopal episode was secondary to A. fib with RVR which is now resolved.    CAD (coronary artery disease) and elevated troponin: trop 32 -->43. No CP.   [place] be due to decreased clearance secondary to ESRD.  Dr. Ubaldo Glassing of cardiology is consulted -Trend troponin -Continue aspirin, Zetia, Lipitor, Imdur, metoprolol, as needed nitroglycerin -Check A1c, FLP -Follow-up 2D echo   ESRD (end stage renal disease) ([name]) Nephrology consulted recommended to continue dialysis as  scheduled. Patient can be discharged,  appointment has been made to continue hemodialysis at his outpatient usual set up.   Type 2 diabetes mellitus with ESRD (end-stage renal disease) ([name]): Recent A1c 8.1, poorly controlled.  Patient taking NovoLog and Levemir at  home -SSI -Decrease Levemir dose from 40 to 30 units daily   Hyperlipidemia -Zetia and Lipitor   Chronic systolic CHF (congestive heart failure) ([name]):  2D echo on 01/14/2020 showed EF of 45-50%.   Patient does not have leg edema.  No respiratory distress.  CHF seems to be compensated. -Volume management per renal by dialysis   Atrial fibrillation, chronic ([name]): Not taking anticoagulants -Continue metoprolol, amiodarone and Eliquis   GERD (gastroesophageal reflux disease) -Protonix   Gout -Allopurinol   Depression -Continue home medications     Discharge Instructions  Discharge Instructions     Call MD for:  difficulty breathing, headache or visual disturbances   Complete by: As directed    Call MD for:  persistant dizziness or light-headedness   Complete by: As directed    Call MD for:  persistant nausea and vomiting   Complete by: As directed    Diet - low sodium heart healthy   Complete by: As directed    Diet Carb Modified   Complete by: As directed    Discharge instructions   Complete by: As directed    Advised to follow-up with primary care physician in 1 week. Advised to continue hemodialysis as scheduled. tomorrow. Advised to follow-up with cardiology Dr. Ubaldo Glassing as scheduled. Advised to increase amiodarone 200 mg twice daily for now.   Increase activity slowly   Complete by: As directed       Allergies as of 04/13/2020       Reactions   Losartan    Hyperkalemia (K>6)   Morphine Anxiety   Morphine And Related Other (See Comments)   Overdose in hospital per patient report        Medication List     STOP taking these medications    amiodarone 360-4.14 MG/200ML-% Soln Commonly known as: NEXTERONE PREMIX   aspirin 81 MG chewable tablet   heparin 25000 UT/250ML infusion       TAKE these medications    acetaminophen 500 MG tablet Commonly known as: TYLENOL Take 500 mg by mouth every 6 (six) hours as needed.   albuterol 108 (90 Base)  MCG/ACT inhaler Commonly known as: VENTOLIN HFA Inhale 2 puffs into the lungs every 6 (six) hours as needed for wheezing or shortness of breath.   Alirocumab 150 MG/ML Soaj Inject 150 mg as directed every 14 (fourteen) days.   allopurinol 100 MG tablet Commonly known as: ZYLOPRIM Take 200 mg by mouth daily.   amiodarone 200 MG tablet Commonly known as: PACERONE Take 1 tablet (200 mg total) by mouth 2 (two) times daily. What changed: when to take this   amLODipine 10 MG tablet Commonly known as: NORVASC Take 10 mg by mouth daily.   atorvastatin 80 MG tablet Commonly known as: LIPITOR Take 80 mg by mouth every evening.   bumetanide 2 MG tablet Commonly known as: BUMEX Take 2 mg by mouth 2 (two) times daily.   cholecalciferol 25 MCG (1000 UNIT) tablet Commonly known as: VITAMIN D Take 1,000 Units by mouth daily.   Eliquis 5 MG Tabs tablet Generic drug: apixaban Take 5 mg by mouth 2 (two) times daily.   ezetimibe 10 MG tablet Commonly known as: ZETIA Take 10 mg by mouth daily.  FLUoxetine 40 MG capsule Commonly known as: PROZAC Take 80 mg by mouth daily.   gabapentin 300 MG capsule Commonly known as: NEURONTIN Take 300 mg by mouth 2 (two) times a day.   insulin aspart 100 UNIT/ML injection Commonly known as: novoLOG Inject 20 Units into the skin 3 (three) times daily with meals. What changed: how much to take   insulin detemir 100 UNIT/ML injection Commonly known as: LEVEMIR Inject 0.4 mLs (40 Units total) into the skin at bedtime. What changed: how much to take   isosorbide mononitrate 30 MG 24 hr tablet Commonly known as: IMDUR Take 1 tablet (30 mg total) by mouth daily. What changed:   how much to take  additional instructions   loperamide 2 MG capsule Commonly known as: IMODIUM Take 2 mg by mouth as needed for diarrhea or loose stools.   metoprolol succinate 100 MG 24 hr tablet Commonly known as: TOPROL-XL Take 100 mg by mouth daily.    mirtazapine 7.5 MG tablet Commonly known as: REMERON Take 7.5 mg by mouth at bedtime.   multivitamin with minerals Tabs tablet Take 1 tablet by mouth daily.   nitroGLYCERIN 0.4 MG SL tablet Commonly known as: NITROSTAT Place 0.4 mg under the tongue every 5 (five) minutes as needed for chest pain.   omeprazole 40 MG capsule Commonly known as: PRILOSEC Take 40 mg by mouth 2 (two) times daily.   ondansetron 4 MG tablet Commonly known as: ZOFRAN Take 4 mg by mouth every 12 (twelve) hours as needed for nausea.   Ozempic (0.25 or 0.5 MG/DOSE) 2 MG/1.5ML Sopn Generic drug: Semaglutide(0.25 or 0.5MG /DOS) Inject 0.5 mg into the skin once a week.   ranolazine 500 MG 12 hr tablet Commonly known as: RANEXA Take 500 mg by mouth 2 (two) times daily.   Renvela 800 MG tablet Generic drug: sevelamer carbonate Take 2,400 mg by mouth 3 (three) times daily with meals.   ticagrelor 90 MG Tabs tablet Commonly known as: BRILINTA Take 90 mg by mouth 2 (two) times daily.   Voltaren 1 % Gel Generic drug: diclofenac Sodium Apply 2 g topically 3 (three) times daily as needed (left leg pain).        Follow-up Information     Edwin Never, MD Follow up in 1 week(s).   Specialty: Internal Medicine Contact information: [place] [address] [phone]         Teodoro Spray, MD Follow up in 1 week(s).   Specialty: Cardiology Contact information: [address] [place] [address] [phone]                Allergies  Allergen Reactions  . Losartan     Hyperkalemia (K>6)  . Morphine Anxiety  . Morphine And Related Other (See Comments)    Overdose in hospital per patient report    Consultations:  Cardiology and nephrology   Procedures/Studies: . CT Head Wo Contrast  Result Date: 04/12/2020 CLINICAL DATA:  50 year old male with a history mental status changes EXAM: CT HEAD WITHOUT CONTRAST TECHNIQUE:  Contiguous axial images were obtained from the base of the skull through the vertex without intravenous contrast. COMPARISON:  07/08/2018 FINDINGS: Brain: No acute intracranial hemorrhage. No midline shift or mass effect. Gray-white differentiation maintained. Unremarkable appearance of the ventricular system. Vascular: Unremarkable. Skull: No acute fracture.  No aggressive bone lesion identified. 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 Unremarkable paranasal sinuses which are clear. No mastoid effusion. Other: None IMPRESSION: Negative for acute intracranial abnormality. Interval development of bilateral exophthalmos, which can be seen with thyroid disease. Correlation with thyroid labs recommended. Electronically Signed   By: Corrie Mckusick D.O.   On: 04/12/2020 14:55   DG Chest Portable 1 View  Result Date: 04/12/2020 CLINICAL DATA:  Seizure activity, vomiting, history pacemaker, coronary artery disease, chronic systolic heart failure, diabetes mellitus, hypertension, ischemic cardiomyopathy, paroxysmal atrial fibrillation EXAM: PORTABLE CHEST 1 VIEW COMPARISON:  Portable exam 1345 hours compared to 01/23/2020 FINDINGS: RIGHT jugular dual-lumen central venous catheter with tips projecting over RIGHT atrium. LEFT subclavian AICD with leads projecting over RIGHT atrium and RIGHT ventricle. Enlargement of cardiac silhouette with vascular congestion. Stable mediastinal contours. Decreased lung volumes without gross infiltrate, pleural effusion or pneumothorax. Osseous structures unremarkable. IMPRESSION: Enlargement of cardiac silhouette with pulmonary vascular congestion post ICD. No acute abnormalities. Electronically Signed   By: Lavonia Dana M.D.   On: 04/12/2020 13:57      Subjective: Patient was seen and examined at bedside.  He reports feeling better and wanted to be discharged.   Patient is cleared from  cardiology and nephrology to be discharged.  Discharge Exam: Vitals:   04/13/20 1100 04/13/20 1158  BP: 113/64 140/82  Pulse:  75  Resp:  18  Temp:  98 F (36.7 C)  SpO2:  99%   Vitals:   04/13/20 1000 04/13/20 1030 04/13/20 1100 04/13/20 1158  BP: 140/82 130/74 113/64 140/82  Pulse:    75  Resp:    18  Temp:    98 F (36.7 C)  TempSrc:      SpO2:    99%  Weight:      Height:        General: Pt is alert, awake, not in acute distress Cardiovascular: RRR, S1/S2 +, no rubs, no gallops Respiratory: CTA bilaterally, no wheezing, no rhonchi Abdominal: Soft, NT, ND, bowel sounds + Extremities: no edema, no cyanosis    The results of significant diagnostics from this hospitalization (including imaging, microbiology, ancillary and laboratory) are listed below for reference.     Microbiology: Recent Results (from the past 240 hour(s))  SARS CORONAVIRUS 2 (TAT 6-24 HRS) Nasopharyngeal Nasopharyngeal Swab     Status: None   Collection Time: 04/12/20  2:54 PM   Specimen: Nasopharyngeal Swab  Result Value Ref Range Status   SARS Coronavirus 2 NEGATIVE NEGATIVE Final    Comment: (NOTE) SARS-CoV-2 target nucleic acids are NOT DETECTED.  The SARS-CoV-2 RNA is generally detectable in upper and lower respiratory specimens during the acute phase of infection. Negative results do not preclude SARS-CoV-2 infection, do not rule out co-infections with other pathogens, and should not be used as the sole basis for treatment or other patient management decisions. Negative results must be combined with clinical observations, patient history, and epidemiological information. The expected result is Negative.  Fact Sheet for Patients: SugarRoll.be  Fact Sheet for Healthcare Providers: https://www.woods-mathews.com/  This test is not yet approved or cleared by the [place] FDA and  has been authorized for detection and/or diagnosis of  SARS-CoV-2 by FDA under an Emergency Use Authorization (EUA). This EUA will remain  in effect (meaning this test can be used) for the duration of the COVID-19 declaration under Se ction 564(b)(1) of the Act, 21 U.S.C. section 360bbb-3(b)(1), unless the authorization is terminated or revoked sooner.  Performed at [facility], [place]  [address], [place], [place] [address]      Labs: BNP (last 3 results) No results for input(s): BNP in the last 8760 hours. Basic Metabolic Panel: Recent Labs  Lab 04/12/20 1332 04/13/20 0549  NA 137 137  K 5.0 5.3*  CL 101 105  CO2 25 24  GLUCOSE 314* 182*  BUN 38* 40*  CREATININE 5.51* 5.76*  CALCIUM 7.8* 8.1*  MG 2.1  --    Liver Function Tests: Recent Labs  Lab 04/12/20 1332  AST 11*  ALT 10  ALKPHOS 87  BILITOT 0.6  PROT 6.6  ALBUMIN 2.7*   No results for input(s): LIPASE, AMYLASE in the last 168 hours. No results for input(s): AMMONIA in the last 168 hours. CBC: Recent Labs  Lab 04/12/20 1332 04/13/20 0549  WBC 6.8 8.4  NEUTROABS 5.2  --   HGB 10.0* 10.0*  HCT 31.4* 31.4*  MCV 90.2 91.3  PLT 214 212   Cardiac Enzymes: No results for input(s): CKTOTAL, CKMB, CKMBINDEX, TROPONINI in the last 168 hours. BNP: Invalid input(s): POCBNP CBG: Recent Labs  Lab 04/12/20 1612 04/12/20 2218 04/13/20 0800  GLUCAP 184* 166* 195*   D-Dimer No results for input(s): DDIMER in the last 72 hours. Hgb A1c Recent Labs    04/12/20 1332  HGBA1C 8.1*   Lipid Profile Recent Labs    04/13/20 0549  CHOL 100  HDL 44  LDLCALC 33  TRIG 116  CHOLHDL 2.3   Thyroid function studies No results for input(s): TSH, T4TOTAL, T3FREE, THYROIDAB in the last 72 hours.  Invalid input(s): FREET3 Anemia work up No results for input(s): VITAMINB12, FOLATE, FERRITIN, TIBC, IRON, RETICCTPCT in the last 72 hours. Urinalysis    Component Value Date/Time   COLORURINE STRAW (A) 10/28/2018 2145   APPEARANCEUR CLEAR (A) 10/28/2018 2145    LABSPEC 1.008 10/28/2018 2145   PHURINE 5.0 10/28/2018 2145   GLUCOSEU 50 (A) 10/28/2018 2145   HGBUR SMALL (A) 10/28/2018 2145   BILIRUBINUR NEGATIVE 10/28/2018 2145   KETONESUR NEGATIVE 10/28/2018 2145   PROTEINUR 100 (A) 10/28/2018 2145   NITRITE NEGATIVE 10/28/2018 2145   LEUKOCYTESUR NEGATIVE 10/28/2018 2145   Sepsis Labs Invalid input(s): PROCALCITONIN,  WBC,  LACTICIDVEN Microbiology Recent Results (from the past 240 hour(s))  SARS CORONAVIRUS 2 (TAT 6-24 HRS) Nasopharyngeal Nasopharyngeal Swab     Status: None   Collection Time: 04/12/20  2:54 PM   Specimen: Nasopharyngeal Swab  Result Value Ref Range Status   SARS Coronavirus 2 NEGATIVE NEGATIVE Final    Comment: (NOTE) SARS-CoV-2 target nucleic acids are NOT DETECTED.  The SARS-CoV-2 RNA is generally detectable in upper and lower respiratory specimens during the acute phase of infection. Negative results do not preclude SARS-CoV-2 infection, do not rule out co-infections with other pathogens, and should not be used as the sole basis for treatment or other patient management decisions. Negative results must be combined with clinical observations, patient history, and epidemiological information. The expected result is Negative.  Fact Sheet for Patients: SugarRoll.be  Fact Sheet for Healthcare Providers: https://www.woods-mathews.com/  This test is not yet approved or cleared by the [place] FDA and  has been authorized for detection and/or diagnosis of SARS-CoV-2 by FDA under an Emergency Use Authorization (EUA). This EUA will remain  in effect (meaning this test can be used) for the duration of the COVID-19 declaration under Se ction 564(b)(1) of the Act, 21 U.S.C. section 360bbb-3(b)(1), unless the authorization is terminated or revoked sooner.  Performed at [facility], [place] [address], [place], [place] [address]      Time coordinating discharge: Over 30  minutes  SIGNED:   Shawna Clamp, MD  [facility] 04/13/2020, 2:06 PM Pager   If 7PM-7AM, please contact night-coverage [email or web address]

## 2020-04-13 NOTE — Progress Notes (Signed)
Hemodialysis patient known at [facility] [name] 6:00am, patient or wife normally transports to treatments. Contacted today to set up outpatient treatment set up so patient's discharge is not delayed. Patient is set up at home clinic for tomorrow Saturday 1/15 at [place] [facility]. Patient stated he could drive himself to treatment.

## 2020-04-13 NOTE — Progress Notes (Signed)
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 male with history of Coronary artery disease involving native coronary artery of native heart without angina pectoris,  Chronic heart failure with reduced ejection fraction and diastolic dysfunction  Paroxysmal atrial fibrillation . Essential hypertensiona and CKD 5. [facility] recently was [facility] with [facility]i. H underwent cardiac cath which revealed 80% ostial D1 stenosis as well as 90%  ISR of the mid-LAD at the bifurcation of D1 now s/p successful PCI to D1  with a 2.25x15 Xience Skypoint DES. . 90% in-stent restenosis of the distal RCA also appreciated and new compared to 07/2019 study. IVUS was performed and confirmed ISR with well-expanded stents. Now s/p PTCA of the distal RCA/rPDA with a 4.0x12 [place] balloon to high pressure. 3. Elevated left ventricular filling pressures (LVEDP = 30 mm Hg). He has a boaston aicd pacemaker, sleep apnea, He presented to the er with a syncopal episdoe. Interogation of his device revealed probable afib with rvr at time of event. Did not receive a shock. Currenlty in nsr.   Subjective   No significant arrhythmias overnight.  Several apneic episodes while the patient was not on his BiPAP which she is on at home.  No chest pain.  Okay for discharge from a cardiac standpoint.  Will need either hemodialysis inpatient prior to discharge or this afternoon or tomorrow as outpatient.  Inpatient Medications    . allopurinol  100 mg Oral BID  . amiodarone  200 mg Oral  BID  . amLODipine  10 mg Oral Daily  . apixaban  5 mg Oral BID  . aspirin  81 mg Oral Pre-Cath  . atorvastatin  80 mg Oral q1800  . bumetanide  2 mg Oral BID  . Chlorhexidine Gluconate Cloth  6 each Topical Q0600  . cholecalciferol  5,000 Units Oral Daily  . ezetimibe  10 mg Oral q1800  . FLUoxetine  60 mg Oral QHS  . gabapentin  300 mg Oral BID  . insulin aspart  0-5 Units Subcutaneous QHS  . insulin aspart  0-9 Units Subcutaneous TID WC  . insulin detemir  30 Units Subcutaneous QHS  . isosorbide mononitrate  30 mg Oral Daily  . metoprolol succinate  100 mg Oral Daily  . multivitamin with minerals  1 tablet Oral Daily  . pantoprazole  40 mg Oral Daily  . ranolazine  500 mg Oral BID  . sevelamer carbonate  1,600 mg Oral TID WC  . ticagrelor  90 mg Oral BID    Vital Signs    Vitals:   04/13/20 1000 04/13/20 1030 04/13/20 1100 04/13/20 1158  BP: 140/82 130/74 113/64 140/82  Pulse:    75  Resp:    18  Temp:    98 F (36.7 C)  TempSrc:      SpO2:    99%  Weight:      Height:        Intake/Output Summary (Last 24 hours) at 04/13/2020 1317  Last data filed at 04/13/2020 0547 Gross per 24 hour  Intake --  Output 1700 ml  Net -1700 ml   Filed Weights   04/12/20 1328  Weight: (!) 165 kg    Physical Exam    GEN: Well nourished, well developed, in no acute distress.  HEENT: normal.  Neck: Supple, no JVD, carotid bruits, or masses. Cardiac: RRR, no murmurs, rubs, or gallops. No clubbing, cyanosis, edema.  Radials/DP/PT 2+ and equal bilaterally.  Respiratory:  Respirations regular and unlabored, clear to auscultation bilaterally. GI: Soft, nontender, nondistended, BS + x 4. MS: no deformity or atrophy. Skin: warm and dry, no rash. Neuro:  Strength and sensation are intact. Psych: Normal affect.  Labs    CBC Recent Labs    04/12/20 1332 04/13/20 0549  WBC 6.8 8.4  NEUTROABS 5.2  --   HGB 10.0* 10.0*  HCT 31.4* 31.4*  MCV 90.2 91.3  PLT 214 938   Basic  Metabolic Panel Recent Labs    04/12/20 1332 04/13/20 0549  NA 137 137  K 5.0 5.3*  CL 101 105  CO2 25 24  GLUCOSE 314* 182*  BUN 38* 40*  CREATININE 5.51* 5.76*  CALCIUM 7.8* 8.1*  MG 2.1  --    Liver Function Tests Recent Labs    04/12/20 1332  AST 11*  ALT 10  ALKPHOS 87  BILITOT 0.6  PROT 6.6  ALBUMIN 2.7*   No results for input(s): LIPASE, AMYLASE in the last 72 hours. Cardiac Enzymes No results for input(s): CKTOTAL, CKMB, CKMBINDEX, TROPONINI in the last 72 hours. BNP No results for input(s): BNP in the last 72 hours. D-Dimer No results for input(s): DDIMER in the last 72 hours. Hemoglobin A1C Recent Labs    04/12/20 1332  HGBA1C 8.1*   Fasting Lipid Panel Recent Labs    04/13/20 0549  CHOL 100  HDL 44  LDLCALC 33  TRIG 116  CHOLHDL 2.3   Thyroid Function Tests No results for input(s): TSH, T4TOTAL, T3FREE, THYROIDAB in the last 72 hours.  Invalid input(s): FREET3  Telemetry    Sinus rhythm  ECG  Sinus rhythm with no ischemia  Radiology    CT Head Wo Contrast  Result Date: 04/12/2020 CLINICAL DATA:  50 year old male with a history mental status changes EXAM: CT HEAD WITHOUT CONTRAST TECHNIQUE: Contiguous axial images were obtained from the base of the skull through the vertex without intravenous contrast. COMPARISON:  07/08/2018 FINDINGS: Brain: No acute intracranial hemorrhage. No midline shift or mass effect. Gray-white differentiation maintained. Unremarkable appearance of the ventricular system. Vascular: Unremarkable. Skull: No acute fracture.  No aggressive bone lesion identified. 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 Unremarkable paranasal sinuses which are clear. No mastoid effusion. Other: None IMPRESSION: Negative for acute intracranial abnormality. Interval development of bilateral exophthalmos, which can be seen with thyroid disease.  Correlation with thyroid labs recommended. Electronically Signed   By: Corrie Mckusick D.O.   On: 04/12/2020 14:55   DG Chest Portable 1 View  Result Date: 04/12/2020 CLINICAL DATA:  Seizure activity, vomiting, history pacemaker, coronary artery disease, chronic systolic heart failure, diabetes mellitus, hypertension, ischemic cardiomyopathy, paroxysmal atrial fibrillation EXAM: PORTABLE CHEST 1 VIEW COMPARISON:  Portable exam 1345 hours compared to 01/23/2020 FINDINGS: RIGHT jugular dual-lumen central venous catheter with tips projecting over RIGHT atrium. LEFT subclavian AICD with leads projecting over RIGHT atrium and RIGHT ventricle. Enlargement of cardiac silhouette with vascular congestion.  Stable mediastinal contours. Decreased lung volumes without gross infiltrate, pleural effusion or pneumothorax. Osseous structures unremarkable. IMPRESSION: Enlargement of cardiac silhouette with pulmonary vascular congestion post ICD. No acute abnormalities. Electronically Signed   By: Lavonia Dana M.D.   On: 04/12/2020 13:57    Assessment & Plan     50 y.o. male with history of Coronary artery disease involving native coronary artery of native heart without angina pectoris,  Chronic heart failure with reduced ejection fraction and diastolic dysfunction  Paroxysmal atrial fibrillation . Essential hypertensiona and CKD 5. He recently was admitted [facility] with [facility]i. H underwent cardiac cath which revealed 80% ostial D1 stenosis as well as 90%  ISR of the mid-LAD at the bifurcation of D1 now s/p successful PCI to D1  with a 2.25x15 Xience Skypoint DES. . 90% in-stent restenosis of the distal RCA also appreciated and new compared to 07/2019 study. IVUS was performed and confirmed ISR with well-expanded stents. Now s/p PTCA of the distal RCA/rPDA with a 4.0x12 [place] balloon to high pressure. 3. Elevated left ventricular filling pressures (LVEDP = 30 mm Hg). He has a boaston aicd pacemaker, sleep apnea, He presented to  the er with a syncopal episdoe. Interogation of his device revealed probable afib with rvr at time of event. Did not receive a shock. Currenlty in nsr.   1.  Syncope-appears to be secondary to A. fib with RVR based on interrogation of AICD.  He is on amiodarone along with metoprolol succinate 100 mg daily and anticoagulated with apixaban.  We will continue with current regimen although increase the amiodarone to 200 mg twice daily.  Have discussed this with his primary cardiologist who agrees with this attempt.  Attempting to maintain sinus rhythm and avoiding A. fib is at goal.  He will follow-up early next week with his cardiologist.  Would discharge on amiodarone 200 mg twice daily, amlodipine 10 mg daily, atorvastatin 10 mg daily, bumetanide 2 mg twice daily, Zetia 10 mg daily, isosorbide mononitrate 30 mg daily, metoprolol succinate 100 mg daily and Ranexa 500 mg twice daily.  2.  End-stage renal expertise-is on Monday Wednesday Friday dialysis.  What either dialyzed prior to discharge as inpatient or okay to wait till tomorrow as an outpatient.  3.  Sleep apnea-continue with BiPAP at home.  Signed, Javier Docker Barron Vanloan MD 04/13/2020, 1:17 PM  Pager: [phone]

## 2020-04-14 MED ORDER — METOPROLOL SUCCINATE ER 100 MG TABLET,EXTENDED RELEASE 24 HR
ORAL_TABLET | Freq: Every day | ORAL | 1 refills | 30 days
Start: 2020-04-14 — End: 2020-06-13
  Filled 2020-04-19: qty 90, 90d supply, fill #0

## 2020-04-18 MED FILL — PRALUENT PEN 150 MG/ML SUBCUTANEOUS PEN INJECTOR: SUBCUTANEOUS | 84 days supply | Qty: 6 | Fill #3

## 2020-04-19 MED FILL — LEVEMIR FLEXTOUCH U-100 INSULIN 100 UNIT/ML (3 ML) SUBCUTANEOUS PEN: SUBCUTANEOUS | 50 days supply | Qty: 15 | Fill #1

## 2020-04-19 MED FILL — AMIODARONE 200 MG TABLET: 30 days supply | Qty: 60 | Fill #0

## 2020-04-19 NOTE — Unmapped (Addendum)
-  unable to contact leave a vm to call us back and schedule appt with for RCO      ---- Message from Ebbie Latus, LCSW sent at 04/13/2020 12:17 PM EST -----  Thanks Vernona Rieger.    Admin Team: Mr. Kenton and his wife have been informed about calling the clinic to schedule a RCO.  However Mr. Larocque is experiencing significant difficulty related to his health. Please reach out to him to offer RCO with me. I'll discuss with them again about calling the Clinic to schedule an appt when I see him next.    Thank you,

## 2020-04-23 ENCOUNTER — Inpatient Hospital Stay
Admission: EM | Admit: 2020-04-23 | Discharge: 2020-04-25 | DRG: 177 | Disposition: A | Discharge status: Home / Self Care | Payer: Medicare Other | Attending: Internal Medicine | Admitting: Internal Medicine

## 2020-04-23 ENCOUNTER — Other Ambulatory Visit: Payer: Self-pay

## 2020-04-23 ENCOUNTER — Inpatient Hospital Stay: Payer: Medicare Other

## 2020-04-23 ENCOUNTER — Emergency Department: Payer: Medicare Other

## 2020-04-23 DIAGNOSIS — N186 End stage renal disease: Secondary | ICD-10-CM | POA: Diagnosis present

## 2020-04-23 DIAGNOSIS — I469 Cardiac arrest, cause unspecified: Secondary | ICD-10-CM | POA: Diagnosis present

## 2020-04-23 DIAGNOSIS — F32A Depression, unspecified: Secondary | ICD-10-CM | POA: Diagnosis present

## 2020-04-23 DIAGNOSIS — I5022 Chronic systolic (congestive) heart failure: Secondary | ICD-10-CM | POA: Diagnosis present

## 2020-04-23 DIAGNOSIS — Z95810 Presence of automatic (implantable) cardiac defibrillator: Secondary | ICD-10-CM | POA: Diagnosis not present

## 2020-04-23 DIAGNOSIS — M109 Gout, unspecified: Secondary | ICD-10-CM | POA: Diagnosis present

## 2020-04-23 DIAGNOSIS — I480 Paroxysmal atrial fibrillation: Secondary | ICD-10-CM | POA: Diagnosis present

## 2020-04-23 DIAGNOSIS — I255 Ischemic cardiomyopathy: Secondary | ICD-10-CM | POA: Diagnosis present

## 2020-04-23 DIAGNOSIS — I472 Ventricular tachycardia: Secondary | ICD-10-CM | POA: Diagnosis present

## 2020-04-23 DIAGNOSIS — I953 Hypotension of hemodialysis: Secondary | ICD-10-CM | POA: Diagnosis present

## 2020-04-23 DIAGNOSIS — R0902 Hypoxemia: Secondary | ICD-10-CM | POA: Diagnosis present

## 2020-04-23 DIAGNOSIS — E785 Hyperlipidemia, unspecified: Secondary | ICD-10-CM | POA: Diagnosis present

## 2020-04-23 DIAGNOSIS — Z794 Long term (current) use of insulin: Secondary | ICD-10-CM

## 2020-04-23 DIAGNOSIS — R404 Transient alteration of awareness: Secondary | ICD-10-CM | POA: Diagnosis present

## 2020-04-23 DIAGNOSIS — R4189 Other symptoms and signs involving cognitive functions and awareness: Secondary | ICD-10-CM | POA: Diagnosis not present

## 2020-04-23 DIAGNOSIS — U071 COVID-19: Secondary | ICD-10-CM | POA: Diagnosis present

## 2020-04-23 DIAGNOSIS — E66813 Obesity, class 3: Secondary | ICD-10-CM | POA: Diagnosis present

## 2020-04-23 DIAGNOSIS — I132 Hypertensive heart and chronic kidney disease with heart failure and with stage 5 chronic kidney disease, or end stage renal disease: Secondary | ICD-10-CM | POA: Diagnosis present

## 2020-04-23 DIAGNOSIS — Z8249 Family history of ischemic heart disease and other diseases of the circulatory system: Secondary | ICD-10-CM

## 2020-04-23 DIAGNOSIS — F17290 Nicotine dependence, other tobacco product, uncomplicated: Secondary | ICD-10-CM | POA: Diagnosis present

## 2020-04-23 DIAGNOSIS — R55 Syncope and collapse: Secondary | ICD-10-CM | POA: Diagnosis present

## 2020-04-23 DIAGNOSIS — Z955 Presence of coronary angioplasty implant and graft: Secondary | ICD-10-CM | POA: Diagnosis not present

## 2020-04-23 DIAGNOSIS — Z888 Allergy status to other drugs, medicaments and biological substances status: Secondary | ICD-10-CM

## 2020-04-23 DIAGNOSIS — I252 Old myocardial infarction: Secondary | ICD-10-CM

## 2020-04-23 DIAGNOSIS — I2510 Atherosclerotic heart disease of native coronary artery without angina pectoris: Secondary | ICD-10-CM | POA: Diagnosis present

## 2020-04-23 DIAGNOSIS — Z6841 Body Mass Index (BMI) 40.0 and over, adult: Secondary | ICD-10-CM | POA: Diagnosis not present

## 2020-04-23 DIAGNOSIS — R569 Unspecified convulsions: Secondary | ICD-10-CM

## 2020-04-23 DIAGNOSIS — Z833 Family history of diabetes mellitus: Secondary | ICD-10-CM

## 2020-04-23 DIAGNOSIS — E1122 Type 2 diabetes mellitus with diabetic chronic kidney disease: Secondary | ICD-10-CM | POA: Diagnosis present

## 2020-04-23 DIAGNOSIS — I420 Dilated cardiomyopathy: Secondary | ICD-10-CM | POA: Diagnosis present

## 2020-04-23 DIAGNOSIS — Z7901 Long term (current) use of anticoagulants: Secondary | ICD-10-CM

## 2020-04-23 DIAGNOSIS — R4020 Unspecified coma: Secondary | ICD-10-CM

## 2020-04-23 DIAGNOSIS — Z992 Dependence on renal dialysis: Secondary | ICD-10-CM

## 2020-04-23 DIAGNOSIS — N2581 Secondary hyperparathyroidism of renal origin: Secondary | ICD-10-CM | POA: Diagnosis present

## 2020-04-23 DIAGNOSIS — Z79899 Other long term (current) drug therapy: Secondary | ICD-10-CM

## 2020-04-23 DIAGNOSIS — D631 Anemia in chronic kidney disease: Secondary | ICD-10-CM | POA: Diagnosis present

## 2020-04-23 DIAGNOSIS — G4733 Obstructive sleep apnea (adult) (pediatric): Secondary | ICD-10-CM | POA: Diagnosis present

## 2020-04-23 DIAGNOSIS — I440 Atrioventricular block, first degree: Secondary | ICD-10-CM | POA: Diagnosis present

## 2020-04-23 DIAGNOSIS — Z885 Allergy status to narcotic agent status: Secondary | ICD-10-CM

## 2020-04-23 LAB — CBG MONITORING, ED
Glucose-Capillary: 148 mg/dL — ABNORMAL HIGH (ref 70–99)
Glucose-Capillary: 128 mg/dL — ABNORMAL HIGH (ref 70–99)
Glucose-Capillary: 133 mg/dL — ABNORMAL HIGH (ref 70–99)

## 2020-04-23 LAB — CBC
HCT: 34.1 % — ABNORMAL LOW (ref 39.0–52.0)
Hemoglobin: 10.6 g/dL — ABNORMAL LOW (ref 13.0–17.0)
MCH: 27.7 pg (ref 26.0–34.0)
MCHC: 31.1 g/dL (ref 30.0–36.0)
MCV: 89 fL (ref 80.0–100.0)
Platelets: 112 10*3/uL — ABNORMAL LOW (ref 150–400)
RBC: 3.83 MIL/uL — ABNORMAL LOW (ref 4.22–5.81)
RDW: 15.7 % — ABNORMAL HIGH (ref 11.5–15.5)
WBC: 3.8 10*3/uL — ABNORMAL LOW (ref 4.0–10.5)
nRBC: 0 % (ref 0.0–0.2)

## 2020-04-23 LAB — COMPREHENSIVE METABOLIC PANEL
ALT: 16 U/L (ref 0–44)
AST: 24 U/L (ref 15–41)
Albumin: 2.6 g/dL — ABNORMAL LOW (ref 3.5–5.0)
Alkaline Phosphatase: 75 U/L (ref 38–126)
Anion gap: 12 (ref 5–15)
BUN: 27 mg/dL — ABNORMAL HIGH (ref 6–20)
CO2: 23 mmol/L (ref 22–32)
Calcium: 8.3 mg/dL — ABNORMAL LOW (ref 8.9–10.3)
Chloride: 102 mmol/L (ref 98–111)
Creatinine, Ser: 5.68 mg/dL — ABNORMAL HIGH (ref 0.61–1.24)
GFR, Estimated: 11 mL/min — ABNORMAL LOW (ref >60)
Glucose, Bld: 216 mg/dL — ABNORMAL HIGH (ref 70–99)
Potassium: 4.3 mmol/L (ref 3.5–5.1)
Sodium: 137 mmol/L (ref 135–145)
Total Bilirubin: 0.5 mg/dL (ref 0.3–1.2)
Total Protein: 6.7 g/dL (ref 6.5–8.1)

## 2020-04-23 LAB — TROPONIN I (HIGH SENSITIVITY)
Troponin I (High Sensitivity): 86 ng/L — ABNORMAL HIGH (ref <18)
Troponin I (High Sensitivity): 87 ng/L — ABNORMAL HIGH (ref <18)

## 2020-04-23 LAB — SARS CORONAVIRUS 2 BY RT PCR (HOSPITAL ORDER, PERFORMED IN ~~LOC~~ HOSPITAL LAB): SARS Coronavirus 2: POSITIVE — AB

## 2020-04-23 MED ORDER — METOPROLOL SUCCINATE ER 50 MG PO TB24
100.0000 mg | ORAL_TABLET | Freq: Every day | ORAL | Status: DC
  Administered 2020-04-23 – 2020-04-25 (×3): 100 mg via ORAL
  Filled 2020-04-23 (×3): qty 2

## 2020-04-23 MED ORDER — AMIODARONE HCL 200 MG PO TABS
200.0000 mg | ORAL_TABLET | Freq: Two times a day (BID) | ORAL | Status: DC
  Administered 2020-04-23 – 2020-04-25 (×4): 200 mg via ORAL
  Filled 2020-04-23 (×5): qty 1

## 2020-04-23 MED ORDER — ALLOPURINOL 100 MG PO TABS
200.0000 mg | ORAL_TABLET | Freq: Every day | ORAL | Status: DC
  Administered 2020-04-23 – 2020-04-25 (×3): 200 mg via ORAL
  Filled 2020-04-23 (×3): qty 2

## 2020-04-23 MED ORDER — ALBUTEROL SULFATE HFA 108 (90 BASE) MCG/ACT IN AERS
2.0000 | INHALATION_SPRAY | Freq: Four times a day (QID) | RESPIRATORY_TRACT | Status: DC | PRN
  Filled 2020-04-23: qty 6.7

## 2020-04-23 MED ORDER — ONDANSETRON HCL 4 MG/2ML IJ SOLN: 4.0000 mg | Freq: Four times a day (QID) | INTRAMUSCULAR | Status: DC | PRN

## 2020-04-23 MED ORDER — PANTOPRAZOLE SODIUM 40 MG PO TBEC
40.0000 mg | DELAYED_RELEASE_TABLET | Freq: Every day | ORAL | Status: DC
  Administered 2020-04-23 – 2020-04-25 (×3): 40 mg via ORAL
  Filled 2020-04-23 (×3): qty 1

## 2020-04-23 MED ORDER — MIRTAZAPINE 15 MG PO TABS
7.5000 mg | ORAL_TABLET | Freq: Every day | ORAL | Status: DC
  Administered 2020-04-23 – 2020-04-24 (×2): 7.5 mg via ORAL
  Filled 2020-04-23 (×2): qty 1

## 2020-04-23 MED ORDER — LEVETIRACETAM IN NACL 500 MG/100ML IV SOLN
500.0000 mg | Freq: Two times a day (BID) | INTRAVENOUS | Status: DC
  Administered 2020-04-23: 500 mg via INTRAVENOUS
  Filled 2020-04-23 (×3): qty 100

## 2020-04-23 MED ORDER — ONDANSETRON HCL 4 MG PO TABS: 4.0000 mg | ORAL_TABLET | Freq: Four times a day (QID) | ORAL | Status: DC | PRN

## 2020-04-23 MED ORDER — LEVETIRACETAM IN NACL 1000 MG/100ML IV SOLN
1000.0000 mg | Freq: Once | INTRAVENOUS | Status: AC
  Administered 2020-04-23: 1000 mg via INTRAVENOUS
  Filled 2020-04-23: qty 100

## 2020-04-23 MED ORDER — FLUOXETINE HCL 20 MG PO CAPS
80.0000 mg | ORAL_CAPSULE | Freq: Every day | ORAL | Status: DC
  Administered 2020-04-23 – 2020-04-25 (×3): 80 mg via ORAL
  Filled 2020-04-23 (×3): qty 4

## 2020-04-23 MED ORDER — GABAPENTIN 100 MG PO CAPS
100.0000 mg | ORAL_CAPSULE | Freq: Two times a day (BID) | ORAL | Status: DC
  Administered 2020-04-23 – 2020-04-25 (×4): 100 mg via ORAL
  Filled 2020-04-23 (×4): qty 1

## 2020-04-23 MED ORDER — ATORVASTATIN CALCIUM 20 MG PO TABS
80.0000 mg | ORAL_TABLET | Freq: Every evening | ORAL | Status: DC
  Administered 2020-04-23 – 2020-04-24 (×2): 80 mg via ORAL
  Filled 2020-04-23 (×2): qty 4

## 2020-04-23 MED ORDER — BUMETANIDE 1 MG PO TABS
2.0000 mg | ORAL_TABLET | Freq: Two times a day (BID) | ORAL | Status: DC
  Administered 2020-04-23 – 2020-04-25 (×4): 2 mg via ORAL
  Filled 2020-04-23 (×5): qty 2

## 2020-04-23 MED ORDER — APIXABAN 5 MG PO TABS
5.0000 mg | ORAL_TABLET | Freq: Two times a day (BID) | ORAL | Status: DC
  Administered 2020-04-23 – 2020-04-25 (×4): 5 mg via ORAL
  Filled 2020-04-23 (×4): qty 1

## 2020-04-23 MED ORDER — EZETIMIBE 10 MG PO TABS
10.0000 mg | ORAL_TABLET | Freq: Every day | ORAL | Status: DC
  Administered 2020-04-23 – 2020-04-25 (×3): 10 mg via ORAL
  Filled 2020-04-23 (×3): qty 1

## 2020-04-23 MED ORDER — TICAGRELOR 90 MG PO TABS
90.0000 mg | ORAL_TABLET | Freq: Two times a day (BID) | ORAL | Status: DC
Start: 2020-04-23 — End: 2020-04-25
  Administered 2020-04-23 – 2020-04-25 (×4): 90 mg via ORAL
  Filled 2020-04-23 (×4): qty 1

## 2020-04-23 MED ORDER — RANOLAZINE ER 500 MG PO TB12
500.0000 mg | ORAL_TABLET | Freq: Two times a day (BID) | ORAL | Status: DC
  Administered 2020-04-23 – 2020-04-25 (×5): 500 mg via ORAL
  Filled 2020-04-23 (×6): qty 1

## 2020-04-23 MED ORDER — DEXTROSE-NACL 5-0.9 % IV SOLN: INTRAVENOUS | Status: DC

## 2020-04-23 MED ORDER — ADULT MULTIVITAMIN W/MINERALS CH
1.0000 | ORAL_TABLET | Freq: Every day | ORAL | Status: DC
  Administered 2020-04-23 – 2020-04-25 (×3): 1 via ORAL
  Filled 2020-04-23 (×3): qty 1

## 2020-04-23 MED ORDER — ACETAMINOPHEN 500 MG PO TABS: 500.0000 mg | ORAL_TABLET | Freq: Four times a day (QID) | ORAL | Status: DC | PRN

## 2020-04-23 MED ORDER — VITAMIN D3 25 MCG (1000 UNIT) PO TABS
1000.0000 [IU] | ORAL_TABLET | Freq: Every day | ORAL | Status: DC
  Administered 2020-04-23 – 2020-04-25 (×3): 1000 [IU] via ORAL
  Filled 2020-04-23 (×5): qty 1

## 2020-04-23 MED ORDER — INSULIN ASPART 100 UNIT/ML ~~LOC~~ SOLN
0.0000 [IU] | SUBCUTANEOUS | Status: DC
  Administered 2020-04-24: 1 [IU] via SUBCUTANEOUS
  Administered 2020-04-24 (×2): 2 [IU] via SUBCUTANEOUS
  Administered 2020-04-24: 1 [IU] via SUBCUTANEOUS
  Administered 2020-04-24: 2 [IU] via SUBCUTANEOUS
  Administered 2020-04-25: 1 [IU] via SUBCUTANEOUS
  Filled 2020-04-23 (×6): qty 1

## 2020-04-23 MED ORDER — ISOSORBIDE MONONITRATE ER 60 MG PO TB24
60.0000 mg | ORAL_TABLET | Freq: Every day | ORAL | Status: DC
  Administered 2020-04-23 – 2020-04-25 (×3): 60 mg via ORAL
  Filled 2020-04-23 (×3): qty 1

## 2020-04-23 MED ORDER — SEVELAMER CARBONATE 800 MG PO TABS
2400.0000 mg | ORAL_TABLET | Freq: Three times a day (TID) | ORAL | Status: DC
  Administered 2020-04-23 – 2020-04-25 (×6): 2400 mg via ORAL
  Filled 2020-04-23 (×9): qty 3

## 2020-04-23 MED ORDER — NITROGLYCERIN 0.4 MG SL SUBL: 0.4000 mg | SUBLINGUAL_TABLET | SUBLINGUAL | Status: DC | PRN

## 2020-04-23 NOTE — ED Triage Notes (Addendum)
Pt brought in by EMS from dialysis. Per EMS pt was hypertensive at dialysis received treatment and became pale, unresponsive and AMS. Dialysis called EMS. EMS arrives and pt was A&O with them then had a seizure went into [place] and lost pulses. CPR was performed and pulses regained. Pt was in [place] for about 2-3 seconds.  Pt given 2 mg of versed, 1 g of Ca, and bolus of amiodarone. 18g in Right arm  Pt arrives to hospital minimally responsive with sternal rub and able to answer where he is at.  MD Pad at beside

## 2020-04-23 NOTE — ED Notes (Signed)
Patient refusing Bipap.  

## 2020-04-23 NOTE — ED Notes (Signed)
Patient ripped off bipap stating that it is suffocating him. Contacted respiratory to attempt to readjust patient.

## 2020-04-23 NOTE — Consult Note (Addendum)
Neurology Consultation  Reason for Consult: Seizures Referring Physician: Dr. [place] Callas hospitalist  CC: Seizures  History is obtained from: Chart, patient's wife, patient  HPI: Edwin [name] is a 50 y.o. male past medical history of diabetes, chronic systolic heart failure, CAD, hypertension, hyperlipidemia, paroxysmal atrial fibrillation on anticoagulation, ESRD on dialysis, presenting for evaluation of seizures.  According to the patient's wife, he has been having what she describes as "mild seizures" for over last 1 year for which she has been evaluated at [facility], and been told that this is probably because of the fluctuations in his blood pressures with his dialysis.  He was brought into the hospital emergency room this morning because he had an episode of extreme hypertension as well as unresponsiveness at dialysis.  EMS was called, on their arrival the patient was awake and alert and then he had a witnessed seizure that lasted for couple minutes-at that time, he also became pulseless and was in V. tach requiring CPR for a very brief duration of time.  Initially he was extremely hyper tensive and when he was hooked up to EMS monitor, he was hypotensive.  Patient was given 2 mg of Versed, 1 g of calcium and bolus of amiodarone onsite by EMS and brought for further evaluation.  In the emergency room, he has had episodes where he becomes unresponsive and at times stiff as well.  There was also a description of an event where he became unresponsive, eyes gazing to the left and whole body stiffening. No bowel or bladder incontinence was noted and he came to rather quickly. I spoke with his wife over the phone.  She describes that has been having these mild seizures where he has episodes of unresponsiveness and then comes back around without remembering any of those episodes.  Had discussed this with the outpatient doctors and cardiologist told them that it is probably related to his  blood pressure fluctuations-he was not started on antiepileptics.    He was recently admitted 2 weeks ago to [facility] for evaluation of these frequent episodes of loss of consciousness and had a full cardiac work-up, and was recommended to increase his amiodarone to 200 mg because it was determined that the syncopal episodes were likely secondary to A. fib with RVR which was then resolved at discharge.  He continues to have these episodes of unresponsiveness and now seizure-like activity multiple times.  On further speaking with the wife, specifically exploring the history of depression, she says that he does have a history of depression and is on fluoxetine as well as mirtazapine, which he takes regularly.  He recently has been under a lot of stress-his brother passed away last week, funeral is on this coming Thursday and that has had them in extensive duress.  On asking her whether the mild seizures or other episodes of seizure-like activity are related to any kind of stressors, she said that their frequency definitely increases with stressors but they can also happen sometimes without much of abnormal stressors.  He has never had any bowel bladder incontinence or tongue biting with any of his episodes of seizures.  ROS: Performed and negative except as noted in HPI  Past Medical History:  Diagnosis Date  . CAD (coronary artery disease)   . Chronic systolic heart failure ([place])   . Diabetes mellitus without complication ([place])   . Heart attack ([place])   . Hyperlipidemia   . Hypertension   . Ischemic cardiomyopathy   .  Paroxysmal atrial fibrillation ([name])   . Renal disorder     Family History  Problem Relation Age of Onset  . Diabetes Mother   . Peripheral vascular disease Mother   . COPD Father     Social History:   reports that he has quit smoking. His smokeless tobacco use includes snuff. He reports that he does not drink alcohol and does not use  drugs.  Medications  Current Facility-Administered Medications:  .  acetaminophen (TYLENOL) tablet 500 mg, 500 mg, Oral, Q6H PRN, Agbata, Tochukwu, MD .  albuterol (VENTOLIN HFA) 108 (90 Base) MCG/ACT inhaler 2 puff, 2 puff, Inhalation, Q6H PRN, Agbata, Tochukwu, MD .  allopurinol (ZYLOPRIM) tablet 200 mg, 200 mg, Oral, Daily, Agbata, Tochukwu, MD .  amiodarone (PACERONE) tablet 200 mg, 200 mg, Oral, BID, Agbata, Tochukwu, MD .  apixaban (ELIQUIS) tablet 5 mg, 5 mg, Oral, BID, Agbata, Tochukwu, MD .  atorvastatin (LIPITOR) tablet 80 mg, 80 mg, Oral, QPM, Agbata, Tochukwu, MD .  cholecalciferol (VITAMIN D) tablet 1,000 Units, 1,000 Units, Oral, Daily, Agbata, Tochukwu, MD .  dextrose 5 %-0.9 % sodium chloride infusion, , Intravenous, Continuous, Agbata, Tochukwu, MD, Last Rate: 30 mL/hr at 04/23/20 0954, New Bag at 04/23/20 0954 .  ezetimibe (ZETIA) tablet 10 mg, 10 mg, Oral, Daily, Agbata, Tochukwu, MD .  FLUoxetine (PROZAC) capsule 80 mg, 80 mg, Oral, Daily, Agbata, Tochukwu, MD .  gabapentin (NEURONTIN) capsule 100 mg, 100 mg, Oral, BID, Agbata, Tochukwu, MD .  mirtazapine (REMERON) tablet 7.5 mg, 7.5 mg, Oral, QHS, Agbata, Tochukwu, MD .  multivitamin with minerals tablet 1 tablet, 1 tablet, Oral, Daily, Agbata, Tochukwu, MD .  ondansetron (ZOFRAN) tablet 4 mg, 4 mg, Oral, Q6H PRN **OR** ondansetron (ZOFRAN) injection 4 mg, 4 mg, Intravenous, Q6H PRN, Agbata, Tochukwu, MD .  pantoprazole (PROTONIX) EC tablet 40 mg, 40 mg, Oral, Daily, Agbata, Tochukwu, MD .  ranolazine (RANEXA) 12 hr tablet 500 mg, 500 mg, Oral, BID, Agbata, Tochukwu, MD .  sevelamer carbonate (RENVELA) tablet 2,400 mg, 2,400 mg, Oral, TID WC, Agbata, Tochukwu, MD .  ticagrelor (BRILINTA) tablet 90 mg, 90 mg, Oral, BID, Agbata, Tochukwu, MD  Current Outpatient Medications:  .  acetaminophen (TYLENOL) 500 MG tablet, Take 500 mg by mouth every 6 (six) hours as needed., Disp: , Rfl:  .  albuterol (PROVENTIL HFA;VENTOLIN  HFA) 108 (90 Base) MCG/ACT inhaler, Inhale 2 puffs into the lungs every 6 (six) hours as needed for wheezing or shortness of breath., Disp: 1 Inhaler, Rfl: 2 .  Alirocumab 150 MG/ML SOAJ, Inject 150 mg as directed every 14 (fourteen) days., Disp: , Rfl:  .  allopurinol (ZYLOPRIM) 100 MG tablet, Take 200 mg by mouth daily., Disp: , Rfl:  .  amiodarone (PACERONE) 200 MG tablet, Take 1 tablet (200 mg total) by mouth 2 (two) times daily., Disp: 60 tablet, Rfl: 1 .  amLODipine (NORVASC) 10 MG tablet, Take 10 mg by mouth daily. (Patient not taking: Reported on 04/12/2020), Disp: , Rfl:  .  apixaban (ELIQUIS) 5 MG TABS tablet, Take 5 mg by mouth 2 (two) times daily., Disp: , Rfl:  .  atorvastatin (LIPITOR) 80 MG tablet, Take 80 mg by mouth every evening. , Disp: , Rfl:  .  bumetanide (BUMEX) 2 MG tablet, Take 2 mg by mouth 2 (two) times daily., Disp: , Rfl:  .  cholecalciferol (VITAMIN D) 1000 units tablet, Take 1,000 Units by mouth daily., Disp: , Rfl:  .  ezetimibe (ZETIA) 10 MG tablet, Take  10 mg by mouth daily., Disp: , Rfl:  .  FLUoxetine (PROZAC) 40 MG capsule, Take 80 mg by mouth daily., Disp: , Rfl:  .  gabapentin (NEURONTIN) 300 MG capsule, Take 300 mg by mouth 2 (two) times a day., Disp: , Rfl:  .  insulin aspart (NOVOLOG) 100 UNIT/ML injection, Inject 20 Units into the skin 3 (three) times daily with meals. (Patient taking differently: Inject 15 Units into the skin 3 (three) times daily with meals.), Disp: , Rfl:  .  insulin detemir (LEVEMIR) 100 UNIT/ML injection, Inject 0.4 mLs (40 Units total) into the skin at bedtime. (Patient taking differently: Inject 30 Units into the skin at bedtime.), Disp: , Rfl:  .  isosorbide mononitrate (IMDUR) 30 MG 24 hr tablet, Take 1 tablet (30 mg total) by mouth daily. (Patient taking differently: Take 60 mg by mouth daily. Do not take on dialysis days), Disp: 30 tablet, Rfl: 0 .  loperamide (IMODIUM) 2 MG capsule, Take 2 mg by mouth as needed for diarrhea or  loose stools., Disp: , Rfl:  .  metoprolol succinate (TOPROL-XL) 100 MG 24 hr tablet, Take 100 mg by mouth daily., Disp: , Rfl:  .  mirtazapine (REMERON) 7.5 MG tablet, Take 7.5 mg by mouth at bedtime., Disp: , Rfl:  .  Multiple Vitamin (MULTIVITAMIN WITH MINERALS) TABS tablet, Take 1 tablet by mouth daily., Disp: 30 tablet, Rfl: 0 .  nitroGLYCERIN (NITROSTAT) 0.4 MG SL tablet, Place 0.4 mg under the tongue every 5 (five) minutes as needed for chest pain., Disp: , Rfl:  .  omeprazole (PRILOSEC) 40 MG capsule, Take 40 mg by mouth 2 (two) times daily., Disp: , Rfl:  .  ondansetron (ZOFRAN) 4 MG tablet, Take 4 mg by mouth every 12 (twelve) hours as needed for nausea., Disp: , Rfl:  .  ranolazine (RANEXA) 500 MG 12 hr tablet, Take 500 mg by mouth 2 (two) times daily., Disp: , Rfl:  .  RENVELA 800 MG tablet, Take 2,400 mg by mouth 3 (three) times daily with meals., Disp: , Rfl:  .  Semaglutide,0.25 or 0.'5MG'$ /DOS, (OZEMPIC, 0.25 OR 0.5 MG/DOSE,) 2 MG/1.5ML SOPN, Inject 0.5 mg into the skin once a week., Disp: , Rfl:  .  ticagrelor (BRILINTA) 90 MG TABS tablet, Take 90 mg by mouth 2 (two) times daily., Disp: , Rfl:  .  VOLTAREN 1 % GEL, Apply 2 g topically 3 (three) times daily as needed (left leg pain). , Disp: , Rfl:    Exam: Current vital signs: BP (!) 150/113   Pulse 79   Resp (!) 27   Ht '6\' 2"'$  (1.88 m)   Wt (!) 165 kg   SpO2 95%   BMI 46.70 kg/m  Vital signs in last 24 hours: Pulse Rate:  [70-79] 79 (01/24 0900) Resp:  [16-29] 27 (01/24 0900) BP: (125-170)/(78-113) 150/113 (01/24 0845) SpO2:  [95 %-99 %] 95 % (01/24 0900) Weight:  [165 kg] 165 kg (01/24 0753) General: Sleeping in bed, in no distress HEENT: Normocephalic atraumatic Lungs: Clear to auscultation Extremities warm well perfused with trace pedal edema Abdomen: Obese, nontender Neurological exam He was sleeping in bed, awakens to voice, has somewhat reduced attention concentration but is able to follow commands. Speech is  mildly dysarthric-mostly due to the dry mouth. No evidence of aphasia I did not witness any seizure-like activity during my encounter Cranial nerves: Pupils equal round react light, extract movements intact, visual fields full, facial sensation intact, face symmetric, auditory acuity intact, palate  elevates midline, shoulder shrug intact, tongue strength intact bilaterally. Motor exam: Antigravity 5/5 with mild asterixis in both arms on outward stretching. Sensation: Intact to light touch but reports feeling of tingling in a stocking type pattern in both legs. Coordination: No dysmetria Unable to elicit DTRs at the knees or the ankles.  Unable to elicit DTRs at the biceps the brachioradialis.  Labs I have reviewed labs in epic and the results pertinent to this consultation are:   CBC    Component Value Date/Time   WBC 3.8 (L) 04/23/2020 0752   RBC 3.83 (L) 04/23/2020 0752   HGB 10.6 (L) 04/23/2020 0752   HCT 34.1 (L) 04/23/2020 0752   PLT 112 (L) 04/23/2020 0752   MCV 89.0 04/23/2020 0752   MCH 27.7 04/23/2020 0752   MCHC 31.1 04/23/2020 0752   RDW 15.7 (H) 04/23/2020 0752   LYMPHSABS 0.9 04/12/2020 1332   MONOABS 0.5 04/12/2020 1332   EOSABS 0.2 04/12/2020 1332   BASOSABS 0.1 04/12/2020 1332    CMP     Component Value Date/Time   NA 137 04/23/2020 0752   K 4.3 04/23/2020 0752   CL 102 04/23/2020 0752   CO2 23 04/23/2020 0752   GLUCOSE 216 (H) 04/23/2020 0752   BUN 27 (H) 04/23/2020 0752   CREATININE 5.68 (H) 04/23/2020 0752   CALCIUM 8.3 (L) 04/23/2020 0752   PROT 6.7 04/23/2020 0752   ALBUMIN 2.6 (L) 04/23/2020 0752   AST 24 04/23/2020 0752   ALT 16 04/23/2020 0752   ALKPHOS 75 04/23/2020 0752   BILITOT 0.5 04/23/2020 0752   GFRNONAA 11 (L) 04/23/2020 0752   GFRAA 15 (L) 10/30/2018 0412   Imaging I have reviewed the images obtained:  CT-scan of the brain-no acute changes.  Formal radiology read mentions degree of bilaterally symmetric exophthalmus and  recommends thyroid function evaluation.  Assessment: 50 year old with past medical history of diabetes, chronic systolic heart failure, CAD, hypertension, hyperlipidemia, paroxysmal A. fib on anticoagulation, ESRD on dialysis presenting for evaluation of multiple episodes concerning for seizure-like activity.  Has been having mild seizures as described by the family as episodes of unresponsiveness for the past year or so which have been deemed to be related to blood pressure fluctuations according to the cardiologist per conversation with the wife.  Today he had witnessed tonic-clonic seizure and has had multiple episodes of body stiffening and unresponsiveness along with apnea and desaturation his oxygen levels.  Recent hospitalization for work-up of similar episodes revealed A. fib with RVR likely causing syncopal episodes. Wife also reports extreme stressors-the most recent being loss of the patient's brother to cancer at a young age.  Wife also reports some correlation with increased stressors exacerbating the spells. Given his multiple comorbidities including ESRD, being on dialysis that can predispose to dialysis disequilibrium, A. fib with RVR, difficulty management of blood pressures-seizures can be on the differentials but other differentials such as syncope given the extensive cardiac history as well as dialysis disequilibrium syndrome given the history of dialysis and also nonepileptic spells given the fact that he is never had tongue bite, bowel or bladder incontinence should also be explored.  Impression: Seizure-like activity-evaluate for seizures Evaluate for cardiogenic syncope Evaluate for dialysis disequilibrium syndrome Evaluate for nonepileptic spells   Recommendations: -He was given a load of Keppra in the ED already. -Continue Keppra 500 twice daily-renal dosing. -Maintain seizure precautions -EEG -If the EEG is unremarkable and he continues to have these spells, I would  recommend transfer to  tertiary care center for possible long-term EEG with the hope to capture these events to properly characterize them. -Consider reconsulting cardiology given the fact that last admission he did have A. fib with RVR causing syncopal episodes and a lot of times syncopal convulsions can happen and so can convulsive syncope-very difficult to tease out which one it is without both cardiac monitor and EEGs being hooked up for extended periods of time at the same time. -Discussed my plan with the admitting hospitalist and the attending ER provider in the emergency room  -I will follow with you.   -- Amie Portland, MD Neurologist [facility] Pager: [phone]

## 2020-04-23 NOTE — Progress Notes (Signed)
[facility]  ROUNDING NOTE   Subjective:   [name] was admitted to [facility] on 04/23/2020 for Recurrent episodes of unresponsiveness [R41.89]  Patient went for dialysis this morning where he was found to hypotensive and unresponsive. Did not complete his dialysis treatment. He went into V tach and underwent cardiopulmonary arrest.   Patient placed on BIPAP but now has waken up.   Found to be COVID-19+.   Patient was admitted to [facility] from 1/13 to 1/14 for syncope.    Objective:  Vital signs in last 24 hours:  Pulse Rate:  [69-79] 73 (01/24 1415) Resp:  [16-29] 18 (01/24 1100) BP: (125-180)/(62-134) 143/62 (01/24 1415) SpO2:  [90 %-100 %] 100 % (01/24 1415) Weight:  [165 kg] 165 kg (01/24 0753)  Weight change:  Filed Weights   04/23/20 0753  Weight: (!) 165 kg    Intake/Output: No intake/output data recorded.   Intake/Output this shift:  No intake/output data recorded.  Physical Exam: General: NAD, laying on stretcher  Head: Normocephalic, atraumatic. Moist oral mucosal membranes  Eyes: Anicteric, PERRL  Neck: Supple, trachea midline  Lungs:  Clear to auscultation  Heart: Regular rate and rhythm  Abdomen:  Soft, nontender,   Extremities: + peripheral edema.  Neurologic: Somnolent.   Skin: No lesions  Access: RIJ permcath    Basic Metabolic Panel: Recent Labs  Lab 04/23/20 0752  NA 137  K 4.3  CL 102  CO2 23  GLUCOSE 216*  BUN 27*  CREATININE 5.68*  CALCIUM 8.3*    Liver Function Tests: Recent Labs  Lab 04/23/20 0752  AST 24  ALT 16  ALKPHOS 75  BILITOT 0.5  PROT 6.7  ALBUMIN 2.6*   No results for input(s): LIPASE, AMYLASE in the last 168 hours. No results for input(s): AMMONIA in the last 168 hours.  CBC: Recent Labs  Lab 04/23/20 0752  WBC 3.8*  HGB 10.6*  HCT 34.1*  MCV 89.0  PLT 112*    Cardiac Enzymes: No results for input(s): CKTOTAL, CKMB, CKMBINDEX, TROPONINI in the last 168 hours.  BNP: Invalid  input(s): POCBNP  CBG: Recent Labs  Lab 04/23/20 1144  GLUCAP 148*    Microbiology: Results for orders placed or performed during the hospital encounter of 04/23/20  SARS Coronavirus 2 by RT PCR (hospital order, performed in [facility] hospital lab) Nasopharyngeal Nasopharyngeal [name]     Status: Abnormal   Collection Time: 04/23/20  8:15 AM   Specimen: Nasopharyngeal [name]  Result Value Ref Range Status   SARS Coronavirus 2 POSITIVE (A) NEGATIVE Final    Comment: RESULT CALLED TO, READ BACK BY AND VERIFIED WITH: SAMANTHA HAMILTON ON 04/23/20 AT 59 QSD (NOTE) SARS-CoV-2 target nucleic acids are DETECTED  SARS-CoV-2 RNA is generally detectable in upper respiratory specimens  during the acute phase of infection.  Positive results are indicative  of the presence of the identified virus, but do not rule out bacterial infection or co-infection with other pathogens not detected by the test.  Clinical correlation with patient history and  other diagnostic information is necessary to determine patient infection status.  The expected result is negative.  Fact Sheet for Patients:   StrictlyIdeas.no   Fact Sheet for Healthcare Providers:   BankingDealers.co.za    This test is not yet approved or cleared by the [place] FDA and  has been authorized for detection and/or diagnosis of SARS-CoV-2 by FDA under an Emergency Use Authorization (EUA).  This EUA will remain in effect (  meaning t his test can be used) for the duration of  the COVID-19 declaration under Section 564(b)(1) of the Act, 21 U.S.C. section 360-bbb-3(b)(1), unless the authorization is terminated or revoked sooner.  Performed at [facility], [name] [place], [place] [address]     Coagulation Studies: No results for input(s): LABPROT, INR in the last 72 hours.  Urinalysis: No results for input(s): COLORURINE, LABSPEC, PHURINE, GLUCOSEU, HGBUR,  BILIRUBINUR, KETONESUR, PROTEINUR, UROBILINOGEN, NITRITE, LEUKOCYTESUR in the last 72 hours.  Invalid input(s): APPERANCEUR    Imaging: CT Head Wo Contrast  Result Date: 04/23/2020 CLINICAL DATA:  Altered mental status.  Hypertension. EXAM: CT HEAD WITHOUT CONTRAST TECHNIQUE: Contiguous axial images were obtained from the base of the skull through the vertex without intravenous contrast. COMPARISON:  April 12, 2020. FINDINGS: Brain: Ventricles and sulci are normal in size and configuration. There is no intracranial mass, hemorrhage, extra-axial fluid collection, midline shift. Brain parenchyma appears unremarkable. No acute infarct evident. Vascular: No hyperdense vessel. There is mild calcification in the right carotid siphon region. Skull: The bony calvarium appears intact. Sinuses/Orbits: Paranasal sinuses are clear. There is a degree of exophthalmos bilaterally. Orbits appear symmetric bilaterally. Other: Mastoid air cells are clear. IMPRESSION: 1.  Normal appearing brain parenchyma.  No mass or hemorrhage. 2. There is a degree of exophthalmos, symmetric. Correlation with thyroid function advised in this regard. 3.  Slight calcification in the right carotid siphon region. Electronically Signed   By: Lowella Grip III M.D.   On: 04/23/2020 09:53   DG Chest Portable 1 View  Result Date: 04/23/2020 CLINICAL DATA:  Altered mental status Hypertensive during dialysis Unresponsive EXAM: PORTABLE CHEST 1 VIEW COMPARISON:  04/12/2020 FINDINGS: Unchanged cardiomegaly. Scattered strandy lung opacities likely due to atelectasis. Dual lead left chest wall AICD unchanged in configuration. Dialysis catheter unchanged in position. IMPRESSION: Unchanged mild cardiomegaly. Electronically Signed   By: Miachel Roux M.D.   On: 04/23/2020 08:12     Medications:   . dextrose 5 % and 0.9% NaCl 30 mL/hr at 04/23/20 0954  . levETIRAcetam     . allopurinol  200 mg Oral Daily  . amiodarone  200 mg Oral BID  .  apixaban  5 mg Oral BID  . atorvastatin  80 mg Oral QPM  . bumetanide  2 mg Oral BID  . cholecalciferol  1,000 Units Oral Daily  . ezetimibe  10 mg Oral Daily  . FLUoxetine  80 mg Oral Daily  . gabapentin  100 mg Oral BID  . insulin aspart  0-6 Units Subcutaneous Q4H  . isosorbide mononitrate  60 mg Oral Daily  . metoprolol succinate  100 mg Oral Daily  . mirtazapine  7.5 mg Oral QHS  . multivitamin with minerals  1 tablet Oral Daily  . pantoprazole  40 mg Oral Daily  . ranolazine  500 mg Oral BID  . sevelamer carbonate  2,400 mg Oral TID WC  . ticagrelor  90 mg Oral BID   acetaminophen, albuterol, nitroGLYCERIN, ondansetron **OR** ondansetron (ZOFRAN) IV  Assessment/ Plan:  [name] is a 49 y.o.  male with end stage renal disease on hemodialysis, hypertension, hyperlipidemia, diabetes mellitus type II, atrial fibrillation, coronary artery disease, systolic congestive heart failure, gout, depression who is admitted to [facility] on 04/23/2020 for Recurrent episodes of unresponsiveness [R41.89]  [facility]phrology [name] [facility] RIJ permcath  1. End stage renal disease: dialysis for tomorrow.   2. Hypertension: home regimen of bumetanide, amlodipine, metoprolol  3. Anemia of chronic kidney disease: hemoglobin 10.6. Mircera as outpatient.   4. Secondary Hyperparathyroidism:  - sevelamer with meals.    LOS: 0 Ernest Popowski 1/24/20222:36 PM

## 2020-04-23 NOTE — ED Provider Notes (Signed)
[facility] Emergency Department Provider Note  Time seen: 7:59 AM  I have reviewed the triage vital signs and the nursing notes.   HISTORY  Chief Complaint Altered Mental Status   HPI Edwin [name] is a 50 y.o. male with a past medical history of CAD, ESRD on HD, hypertension, hyperlipidemia, paroxysmal atrial fibrillation presents to the emergency department for unresponsiveness.  EMS states patient went to dialysis this morning was found to have an extremely high blood pressure shortly after starting dialysis blood pressure dropped precipitously and the patient went unresponsive briefly.  EMS states upon their arrival patient was awake alert and oriented however within several minutes patient had what appeared to be seizure-like activity where his eyes appeared to deviate his body stiffened for several seconds.  They state at that point they lost a pulse and performed approximately 10 seconds of CPR before a pulse felt once again.  They brought the patient to the emergency department.  In route to the emergency department patient had an additional episode of seizure-like activity was given 1 of Versed.  EMS had reported possible ventricular tachycardia as well was started on amiodarone and given an amp of calcium possible hyperkalemia.  Upon arrival to the emergency department patient is somewhat somnolent but awakens to voice answers questions appropriately is oriented.  However shortly after arrival to the emergency department patient had an episode in which his eyes deviated and his limbs stiffened, he then went unresponsive but maintains a good pulse throughout.  Past Medical History:  Diagnosis Date  . CAD (coronary artery disease)   . Chronic systolic heart failure ([place])   . Diabetes mellitus without complication ([place])   . Heart attack ([place])   . Hyperlipidemia   . Hypertension   . Ischemic cardiomyopathy   . Paroxysmal atrial fibrillation ([name])   . Renal  disorder     Patient Active Problem List   Diagnosis Date Noted  . Syncope 04/12/2020  . Elevated troponin 04/12/2020  . Atrial fibrillation, chronic ([place]) 04/12/2020  . GERD (gastroesophageal reflux disease) 04/12/2020  . Gout 04/12/2020  . Depression 04/12/2020  . Obesity, Class III, BMI 40-49.9 (morbid obesity) ([place])   . Chronic systolic CHF (congestive heart failure) ([place])   . ESRD (end stage renal disease) ([place]) 01/23/2020  . CAD (coronary artery disease) 01/23/2020  . Type 2 diabetes mellitus with ESRD (end-stage renal disease) ([place]) 01/23/2020  . Essential hypertension 01/23/2020  . Hyperlipidemia 01/23/2020  . Atrial fibrillation with rapid ventricular response ([place]) 01/23/2020  . Syncope and collapse 10/28/2018  . Chest pain 07/08/2018  . NSTEMI (non-ST elevated myocardial infarction) ([place]) 03/08/2018  . Infection of left knee ([place]) 12/16/2017    Past Surgical History:  Procedure Laterality Date  . [employer] pacemaker    . CARDIAC SURGERY    . IRRIGATION AND DEBRIDEMENT KNEE Left 12/17/2017   Procedure: LEFT KNEE DEBRIDEMENT AND SYNOVECTOMY;  Surgeon: Thornton Park, MD;  Location: [facility] ORS;  Service: Orthopedics;  Laterality: Left;  . JOINT REPLACEMENT    . LEFT HEART CATH AND CORONARY ANGIOGRAPHY Left 03/09/2018   Procedure: LEFT HEART CATH AND CORONARY ANGIOGRAPHY;  Surgeon: Nelva Bush, MD;  Location: [place] [facility];  Service: Cardiovascular;  Laterality: Left;  . LEFT HEART CATH AND CORONARY ANGIOGRAPHY N/A 01/25/2020   Procedure: LEFT HEART CATH AND CORONARY ANGIOGRAPHY;  Surgeon: Isaias Cowman, MD;  Location: [place] [facility];  Service: Cardiovascular;  Laterality: N/A;  . PERCUTANEOUS CORONARY STENT INTERVENTION (  PCI-S)      Prior to Admission medications   Medication Sig Start Date End Date Taking? Authorizing Provider  acetaminophen (TYLENOL) 500 MG tablet Take 500 mg by mouth every 6 (six) hours as needed.    [provider]  albuterol (PROVENTIL HFA;VENTOLIN HFA) 108 (90 Base) MCG/ACT inhaler Inhale 2 puffs into the lungs every 6 (six) hours as needed for wheezing or shortness of breath. 04/29/18   Schuyler Amor, MD  Alirocumab 150 MG/ML SOAJ Inject 150 mg as directed every 14 (fourteen) days.    [provider]  allopurinol (ZYLOPRIM) 100 MG tablet Take 200 mg by mouth daily. 03/18/18   [provider]  amiodarone (PACERONE) 200 MG tablet Take 1 tablet (200 mg total) by mouth 2 (two) times daily. 04/13/20   Shawna Clamp, MD  amLODipine (NORVASC) 10 MG tablet Take 10 mg by mouth daily. Patient not taking: Reported on 04/12/2020 09/30/18   [provider]  apixaban (ELIQUIS) 5 MG TABS tablet Take 5 mg by mouth 2 (two) times daily.    [provider]  atorvastatin (LIPITOR) 80 MG tablet Take 80 mg by mouth every evening.  09/14/17   [provider]  bumetanide (BUMEX) 2 MG tablet Take 2 mg by mouth 2 (two) times daily. 12/06/19   [provider]  cholecalciferol (VITAMIN D) 1000 units tablet Take 1,000 Units by mouth daily.    [provider]  ezetimibe (ZETIA) 10 MG tablet Take 10 mg by mouth daily. 10/14/17   [provider]  FLUoxetine (PROZAC) 40 MG capsule Take 80 mg by mouth daily. 12/04/17   [provider]  gabapentin (NEURONTIN) 300 MG capsule Take 300 mg by mouth 2 (two) times a day. 09/27/18   [provider]  insulin aspart (NOVOLOG) 100 UNIT/ML injection Inject 20 Units into the skin 3 (three) times daily with meals. Patient taking differently: Inject 15 Units into the skin 3 (three) times daily with meals. 01/25/20   Loletha Grayer, MD  insulin detemir (LEVEMIR) 100 UNIT/ML injection Inject 0.4 mLs (40 Units total) into the skin at bedtime. Patient taking differently: Inject 30 Units into the skin at bedtime. 01/25/20   Loletha Grayer, MD  isosorbide mononitrate (IMDUR) 30 MG 24 hr tablet Take 1 tablet (30  mg total) by mouth daily. Patient taking differently: Take 60 mg by mouth daily. Do not take on dialysis days 10/30/18   Vaughan Basta, MD  loperamide (IMODIUM) 2 MG capsule Take 2 mg by mouth as needed for diarrhea or loose stools.    [provider]  metoprolol succinate (TOPROL-XL) 100 MG 24 hr tablet Take 100 mg by mouth daily. 01/03/20   [provider]  mirtazapine (REMERON) 7.5 MG tablet Take 7.5 mg by mouth at bedtime.    [provider]  Multiple Vitamin (MULTIVITAMIN WITH MINERALS) TABS tablet Take 1 tablet by mouth daily. 10/31/18   Vaughan Basta, MD  nitroGLYCERIN (NITROSTAT) 0.4 MG SL tablet Place 0.4 mg under the tongue every 5 (five) minutes as needed for chest pain.    [provider]  omeprazole (PRILOSEC) 40 MG capsule Take 40 mg by mouth 2 (two) times daily. 11/25/19   [provider]  ondansetron (ZOFRAN) 4 MG tablet Take 4 mg by mouth every 12 (twelve) hours as needed for nausea. 01/04/20   [provider]  ranolazine (RANEXA) 500 MG 12 hr tablet Take 500 mg by mouth 2 (two) times daily.    [provider]  RENVELA 800 MG tablet Take 2,400 mg by mouth 3 (three) times daily with meals. 10/14/17   [provider]  Semaglutide,0.25 or 0.'5MG'$ /DOS, (OZEMPIC, 0.25 OR 0.5 MG/DOSE,) 2 MG/1.5ML SOPN Inject 0.5 mg into the skin once a week.    [provider]  ticagrelor (BRILINTA) 90 MG TABS tablet Take 90 mg by mouth 2 (two) times daily.    [provider]  VOLTAREN 1 % GEL Apply 2 g topically 3 (three) times daily as needed (left leg pain).  12/04/17   [provider]    Allergies  Allergen Reactions  . Losartan     Hyperkalemia (K>6)  . Morphine Anxiety  . Morphine And Related Other (See Comments)    Overdose in hospital per patient report    Family History  Problem Relation Age of Onset  . Diabetes Mother   . Peripheral vascular disease Mother   . COPD Father      Social History Social History   Tobacco Use  . 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 Smokeless tobacco: Current User    Types: Snuff  Vaping Use  . Vaping Use: Never used  Substance Use Topics  . Alcohol use: Never  . Drug use: Never    Review of Systems Constitutional: Negative for fever. Cardiovascular: Negative for chest pain. Respiratory: Negative for shortness of breath. Gastrointestinal: Negative for abdominal pain, vomiting Musculoskeletal: Back pain per patient. Skin: Negative for skin complaints  Neurological: Negative for headache All other ROS negative  ____________________________________________   PHYSICAL EXAM:  VITAL SIGNS: ED Triage Vitals [04/23/20 0753]  Enc Vitals Group     BP      Pulse      Resp      Temp      Temp src      SpO2      Weight (!) 363 lb 12.1 oz (165 kg)     Height '6\' 2"'$  (1.88 m)     Head Circumference      Peak Flow      Pain Score 6     Pain Loc      Pain Edu?      Excl. in [place]?    Constitutional: Somewhat somnolent but awakens to voice answers questions appropriately.  Is oriented. Eyes: Normal exam ENT      Head: Normocephalic and atraumatic.      Mouth/Throat: Mucous membranes are moist. Cardiovascular: Normal rate, regular rhythm. No murmur Respiratory: Normal respiratory effort without tachypnea nor retractions. Breath sounds are clear.  Right subclavian access. Gastrointestinal: Soft and nontender. No distention.  Obese Musculoskeletal: Nontender with normal range of motion in all extremities.  Neurologic:  Normal speech and language. No gross focal neurologic deficits Skin:  Skin is warm, dry and intact.  Psychiatric: Mood and affect are normal.  ____________________________________________    EKG  EKG viewed and interpreted by myself shows a normal sinus rhythm at 80 bpm with a narrow QRS, normal axis, slight PR prolongation otherwise normal intervals with nonspecific ST  changes.  ____________________________________________    RADIOLOGY  CT scan head shows no acute findings.  Chest x-ray is negative for acute change.  ____________________________________________   INITIAL IMPRESSION / ASSESSMENT AND PLAN / ED COURSE  Pertinent labs & imaging results that were available during my care of the patient were reviewed by me and considered in my medical decision making (see chart for details).   Patient presents to the emergency department from dialysis for  unresponsiveness.  Patient initially arrived somewhat somnolent but had received Versed by EMS, shortly after arrival had an episode in which his eyes appeared to deviate in his upper extremities stiffened.  After which he became unresponsive/somnolent consistent with postictal state.  Patient was on the cardiac monitor no apparent arrhythmia prior to episode.  Maintained a pulse throughout.  We will check labs, obtain chest x-ray, head CT.  Per record review I do not see any history of seizure in the past however the patient was recently discharged proximally 10 days ago after an admission for syncopal episodes.  Given the appearance of today's episode we will load with Keppra as a precaution while we continue the patient's medical work-up.  Differential at this time would be quite broad but would include syncope, seizure, arrhythmia, toxicity, hyperkalemia.  Patient's labs show a mild troponin elevation.  Patient is Covid positive.  I spoke to the hospitalist as well as the intensivist.  Ultimately the patient will be admitted to the hospitalist to a stepdown unit.  I spoke with neurology who will be evaluating the patient as well.  Edwin [name] was evaluated in Emergency Department on 04/23/2020 for the symptoms described in the history of present illness. He was evaluated in the context of the global COVID-19 pandemic, which necessitated consideration that the patient might be at risk for infection with the  SARS-CoV-2 virus that causes COVID-19. Institutional protocols and algorithms that pertain to the evaluation of patients at risk for COVID-19 are in a state of rapid change based on information released by regulatory bodies including the CDC and federal and state organizations. These policies and algorithms were followed during the patient's care in the ED.  CRITICAL CARE Performed by: Harvest Dark   Total critical care time: 45 minutes  Critical care time was exclusive of separately billable procedures and treating other patients.  Critical care was necessary to treat or prevent imminent or life-threatening deterioration.  Critical care was time spent personally by me on the following activities: development of treatment plan with patient and/or surrogate as well as nursing, discussions with consultants, evaluation of patient's response to treatment, examination of patient, obtaining history from patient or surrogate, ordering and performing treatments and interventions, ordering and review of laboratory studies, ordering and review of radiographic studies, pulse oximetry and re-evaluation of patient's condition.  ____________________________________________   FINAL CLINICAL IMPRESSION(S) / ED DIAGNOSES  Loss of consciousness   Harvest Dark, MD 04/23/20 1312

## 2020-04-23 NOTE — ED Notes (Signed)
Pt taken to CT.

## 2020-04-23 NOTE — ED Notes (Addendum)
Pt began to go unresponsive and not responding to sternal rub. Pt became tense and eyes rolled back. MF called to bedside. Code cart brought in and pads placed on pt. Pt had snoring respirations.

## 2020-04-23 NOTE — ED Notes (Signed)
Pt back from CT

## 2020-04-23 NOTE — ED Notes (Signed)
Pt got tense again and sat up in bed. Pt grabbed his right chest and eyes rolled back. Pt began breathing more rapidly. MD called to bedside.

## 2020-04-23 NOTE — Progress Notes (Signed)
eeg done °

## 2020-04-23 NOTE — Procedures (Signed)
[name]  MRN: [id number]  Epilepsy Attending: Lora Havens  Referring Physician/Provider: Dr Amie [place] Date: 04/23/2020 Duration: 21.32 mins  Patient history: 50yo M with seizure like activity. EEG to evaluate for seizure.  Level of alertness: Awake  AEDs during EEG study: GBP, LEV  Technical aspects: This EEG study was done with scalp electrodes positioned according to the 10-20 International system of electrode placement. Electrical activity was acquired at a sampling rate of '500Hz'$  and reviewed with a high frequency filter of '70Hz'$  and a low frequency filter of '1Hz'$ . EEG data were recorded continuously and digitally stored.   Description: The posterior dominant rhythm consists of 8 Hz activity of moderate voltage (25-35 uV) seen predominantly in posterior head regions, symmetric and reactive to eye opening and eye closing. Hyperventilation and photic stimulation were not performed.     IMPRESSION: This study is within normal limits. No seizures or epileptiform discharges were seen throughout the recording.  Edwin [name]

## 2020-04-23 NOTE — ED Notes (Signed)
Pharmacy to send up medications once verified.

## 2020-04-23 NOTE — ED Notes (Signed)
Pt shivering reporting he has "never been this cold in my entire life." multiple warm blankets and socks provided as well as heat turned as high as possible in pts room.

## 2020-04-23 NOTE — ED Notes (Signed)
Pt continues to have episodes of unresponsive breathing heavy and not responsive to sternal rub for several seconds at a time. Pt states he feels like his heart is about to stop and can't breath.

## 2020-04-23 NOTE — Consult Note (Addendum)
Cardiology Consultation Note    Patient ID: Edwin [name], MRN: [id number], DOB/AGE: Mar 04, 1971 50 y.o. Admit date: 04/23/2020   Date of Consult: 04/23/2020 Primary Physician: Caryl Never, MD Primary Cardiologist: Dr. Denman George, [facility]  Chief Complaint: syncope Reason for Consultation: cardiomyopathy/syncope Requesting MD: agbata  HPI: Edwin [name] is a 50 y.o. male with history of  Coronary artery disease involving native coronary artery of native heart without angina pectoris,  Chronic heart failure with reduced ejection fraction and diastolic dysfunction  Paroxysmal atrial fibrillation . Essential hypertensiona and CKD 5. [facility]. H underwent cardiac cath in 12/21 which revealed 80% ostial D1 stenosis as well as 90%  ISR of the mid-LAD at the bifurcation of D1 now s/p successful PCI to D1  with a 2.25x15 Xience Skypoint DES. . 90% in-stent restenosis of the distal RCA also appreciated and new compared to 07/2019 study. IVUS was performed and confirmed ISR with well-expanded stents. Now s/p PTCA of the distal RCA/rPDA with a 4.0x12 [place] balloon to high pressure.  Elevated left ventricular filling pressures (LVEDP = 30 mm Hg). He has a boaston aicd pacemaker, sleep apnea, He was seen 2 weeks ago with syncope and was noted to have episodes of afib with rvr at the time of his events. AICD did not fire. He was brought to the er this admission after noted to be hypotensive during HD and then becam unresponsive. He was felt to be in VT by EMS. He underwent CPR and was given a bolus of iv amiodarone. He was noted to be iintermitantly unresponsive in the er with response to sternal rubs. Electrolytes are normal. hsTrop was unremarkable. COVID positive. Head CT unremarkable. Episodes of unresponsiveness in the er are associated with hypoxia.  Patient states that he has been doing reasonably well although is mourning the death of a brother from cancer.  He was first  on the scene when he had sudden death.  He reports that he is compliant with his medications.  Been having episodes of unresponsiveness.  As mentioned above, was unresponsive after a period of hypotension during hemodialysis.  Was felt to be in V. tach and was given amiodarone.  He has had further episodes in the ER although rhythm appears to be sinus rhythm with intermittent ventricular paced beats.  There are some nonsustained runs on telemetry.  Interrogation of his device revealed nonsustained brief runs of vt.  Intermittent nonsustained afib. Currently in nsr or ap\ vs  Past Medical History:  Diagnosis Date  . CAD (coronary artery disease)   . Chronic systolic heart failure ([place])   . Diabetes mellitus without complication ([place])   . Heart attack ([place])   . Hyperlipidemia   . Hypertension   . Ischemic cardiomyopathy   . Paroxysmal atrial fibrillation ([name])   . Renal disorder       Surgical History:  Past Surgical History:  Procedure Laterality Date  . [employer] pacemaker    . CARDIAC SURGERY    . IRRIGATION AND DEBRIDEMENT KNEE Left 12/17/2017   Procedure: LEFT KNEE DEBRIDEMENT AND SYNOVECTOMY;  Surgeon: Thornton Park, MD;  Location: [facility] ORS;  Service: Orthopedics;  Laterality: Left;  . JOINT REPLACEMENT    . LEFT HEART CATH AND CORONARY ANGIOGRAPHY Left 03/09/2018   Procedure: LEFT HEART CATH AND CORONARY ANGIOGRAPHY;  Surgeon: Nelva Bush, MD;  Location: [place] [facility];  Service: Cardiovascular;  Laterality: Left;  . LEFT HEART CATH  AND CORONARY ANGIOGRAPHY N/A 01/25/2020   Procedure: LEFT HEART CATH AND CORONARY ANGIOGRAPHY;  Surgeon: Isaias Cowman, MD;  Location: [place] [facility];  Service: Cardiovascular;  Laterality: N/A;  . PERCUTANEOUS CORONARY STENT INTERVENTION (PCI-S)       Home Meds: Prior to Admission medications   Medication Sig Start Date End Date Taking? Authorizing Provider  acetaminophen (TYLENOL) 500 MG tablet Take 500 mg by mouth  every 6 (six) hours as needed.    [provider]  albuterol (PROVENTIL HFA;VENTOLIN HFA) 108 (90 Base) MCG/ACT inhaler Inhale 2 puffs into the lungs every 6 (six) hours as needed for wheezing or shortness of breath. 04/29/18   Schuyler Amor, MD  Alirocumab 150 MG/ML SOAJ Inject 150 mg as directed every 14 (fourteen) days.    [provider]  allopurinol (ZYLOPRIM) 100 MG tablet Take 200 mg by mouth daily. 03/18/18   [provider]  amiodarone (PACERONE) 200 MG tablet Take 1 tablet (200 mg total) by mouth 2 (two) times daily. 04/13/20   Shawna Clamp, MD  amLODipine (NORVASC) 10 MG tablet Take 10 mg by mouth daily. Patient not taking: Reported on 04/12/2020 09/30/18   [provider]  apixaban (ELIQUIS) 5 MG TABS tablet Take 5 mg by mouth 2 (two) times daily.    [provider]  atorvastatin (LIPITOR) 80 MG tablet Take 80 mg by mouth every evening.  09/14/17   [provider]  bumetanide (BUMEX) 2 MG tablet Take 2 mg by mouth 2 (two) times daily. 12/06/19   [provider]  cholecalciferol (VITAMIN D) 1000 units tablet Take 1,000 Units by mouth daily.    [provider]  ezetimibe (ZETIA) 10 MG tablet Take 10 mg by mouth daily. 10/14/17   [provider]  FLUoxetine (PROZAC) 40 MG capsule Take 80 mg by mouth daily. 12/04/17   [provider]  gabapentin (NEURONTIN) 300 MG capsule Take 300 mg by mouth 2 (two) times a day. 09/27/18   [provider]  insulin aspart (NOVOLOG) 100 UNIT/ML injection Inject 20 Units into the skin 3 (three) times daily with meals. Patient taking differently: Inject 15 Units into the skin 3 (three) times daily with meals. 01/25/20   Loletha Grayer, MD  insulin detemir (LEVEMIR) 100 UNIT/ML injection Inject 0.4 mLs (40 Units total) into the skin at bedtime. Patient taking differently: Inject 30 Units into the skin at bedtime. 01/25/20   Loletha Grayer, MD  isosorbide mononitrate  (IMDUR) 30 MG 24 hr tablet Take 1 tablet (30 mg total) by mouth daily. Patient taking differently: Take 60 mg by mouth daily. Do not take on dialysis days 10/30/18   Vaughan Basta, MD  loperamide (IMODIUM) 2 MG capsule Take 2 mg by mouth as needed for diarrhea or loose stools.    [provider]  metoprolol succinate (TOPROL-XL) 100 MG 24 hr tablet Take 100 mg by mouth daily. 01/03/20   [provider]  mirtazapine (REMERON) 7.5 MG tablet Take 7.5 mg by mouth at bedtime.    [provider]  Multiple Vitamin (MULTIVITAMIN WITH MINERALS) TABS tablet Take 1 tablet by mouth daily. 10/31/18   Vaughan Basta, MD  nitroGLYCERIN (NITROSTAT) 0.4 MG SL tablet Place 0.4 mg under the tongue every 5 (five) minutes as needed for chest pain.    [provider]  omeprazole (PRILOSEC) 40 MG capsule Take 40 mg by mouth 2 (two) times daily. 11/25/19   [provider]  ondansetron (ZOFRAN) 4 MG tablet  Take 4 mg by mouth every 12 (twelve) hours as needed for nausea. 01/04/20   [provider]  ranolazine (RANEXA) 500 MG 12 hr tablet Take 500 mg by mouth 2 (two) times daily.    [provider]  RENVELA 800 MG tablet Take 2,400 mg by mouth 3 (three) times daily with meals. 10/14/17   [provider]  Semaglutide,0.25 or 0.'5MG'$ /DOS, (OZEMPIC, 0.25 OR 0.5 MG/DOSE,) 2 MG/1.5ML SOPN Inject 0.5 mg into the skin once a week.    [provider]  ticagrelor (BRILINTA) 90 MG TABS tablet Take 90 mg by mouth 2 (two) times daily.    [provider]  VOLTAREN 1 % GEL Apply 2 g topically 3 (three) times daily as needed (left leg pain).  12/04/17   [provider]    Inpatient Medications:  . allopurinol  200 mg Oral Daily  . amiodarone  200 mg Oral BID  . apixaban  5 mg Oral BID  . atorvastatin  80 mg Oral QPM  . bumetanide  2 mg Oral BID  . cholecalciferol  1,000 Units Oral Daily  . ezetimibe  10 mg Oral Daily  . FLUoxetine   80 mg Oral Daily  . gabapentin  100 mg Oral BID  . insulin aspart  0-6 Units Subcutaneous Q4H  . isosorbide mononitrate  60 mg Oral Daily  . metoprolol succinate  100 mg Oral Daily  . mirtazapine  7.5 mg Oral QHS  . multivitamin with minerals  1 tablet Oral Daily  . pantoprazole  40 mg Oral Daily  . ranolazine  500 mg Oral BID  . sevelamer carbonate  2,400 mg Oral TID WC  . ticagrelor  90 mg Oral BID   . dextrose 5 % and 0.9% NaCl 30 mL/hr at 04/23/20 0954  . levETIRAcetam      Allergies:  Allergies  Allergen Reactions  . Losartan     Hyperkalemia (K>6)  . Morphine Anxiety  . Morphine And Related Other (See Comments)    Overdose in hospital per patient report    Social History   Socioeconomic History  . Marital status: Married    Spouse name: Not on file  . Number of children: Not on file  . Years of education: Not on file  . Highest education level: Not on file  Occupational History  . Not on file  Tobacco Use  . 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 Smokeless tobacco: Current User    Types: Snuff  Vaping Use  . Vaping Use: Never used  Substance and Sexual Activity  . Alcohol use: Never  . Drug use: Never  . Sexual activity: Not Currently    Birth control/protection: None  Other Topics Concern  . Not on file  Social History Narrative  . Not on file   Social Determinants of Health   Financial Resource Strain: Not on file  Food Insecurity: Not on file  Transportation Needs: Not on file  Physical Activity: Not on file  Stress: Not on file  Social Connections: Not on file  Intimate Partner Violence: Not on file     Family History  Problem Relation Age of Onset  . Diabetes Mother   . Peripheral vascular disease Mother   . COPD Father      Review of Systems: A 12-system review of systems was performed and is negative except as noted in the HPI.  Labs: No results for input(s): CKTOTAL, CKMB, TROPONINI in the last 72 hours. Lab Results  Component Value  Date   WBC 3.8 (L) 04/23/2020   HGB 10.6 (L) 04/23/2020   HCT 34.1 (L) 04/23/2020   MCV 89.0 04/23/2020   PLT 112 (L) 04/23/2020    Recent Labs  Lab 04/23/20 0752  NA 137  K 4.3  CL 102  CO2 23  BUN 27*  CREATININE 5.68*  CALCIUM 8.3*  PROT 6.7  BILITOT 0.5  ALKPHOS 75  ALT 16  AST 24  GLUCOSE 216*   Lab Results  Component Value Date   CHOL 100 04/13/2020   HDL 44 04/13/2020   LDLCALC 33 04/13/2020   TRIG 116 04/13/2020   No results found for: DDIMER  Radiology/Studies:  CT Head Wo Contrast  Result Date: 04/23/2020 CLINICAL DATA:  Altered mental status.  Hypertension. EXAM: CT HEAD WITHOUT CONTRAST TECHNIQUE: Contiguous axial images were obtained from the base of the skull through the vertex without intravenous contrast. COMPARISON:  April 12, 2020. FINDINGS: Brain: Ventricles and sulci are normal in size and configuration. There is no intracranial mass, hemorrhage, extra-axial fluid collection, midline shift. Brain parenchyma appears unremarkable. No acute infarct evident. Vascular: No hyperdense vessel. There is mild calcification in the right carotid siphon region. Skull: The bony calvarium appears intact. Sinuses/Orbits: Paranasal sinuses are clear. There is a degree of exophthalmos bilaterally. Orbits appear symmetric bilaterally. Other: Mastoid air cells are clear. IMPRESSION: 1.  Normal appearing brain parenchyma.  No mass or hemorrhage. 2. There is a degree of exophthalmos, symmetric. Correlation with thyroid function advised in this regard. 3.  Slight calcification in the right carotid siphon region. Electronically Signed   By: Lowella Grip III M.D.   On: 04/23/2020 09:53   CT Head Wo Contrast  Result Date: 04/12/2020 CLINICAL DATA:  50 year old male with a history mental status changes EXAM: CT HEAD WITHOUT CONTRAST TECHNIQUE: Contiguous axial images were obtained from the base of the skull through the vertex without intravenous contrast. COMPARISON:   07/08/2018 FINDINGS: Brain: No acute intracranial hemorrhage. No midline shift or mass effect. Gray-white differentiation maintained. Unremarkable appearance of the ventricular system. Vascular: Unremarkable. Skull: No acute fracture.  No aggressive bone lesion identified. 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 Unremarkable paranasal sinuses which are clear. No mastoid effusion. Other: None IMPRESSION: Negative for acute intracranial abnormality. Interval development of bilateral exophthalmos, which can be seen with thyroid disease. Correlation with thyroid labs recommended. Electronically Signed   By: Corrie Mckusick D.O.   On: 04/12/2020 14:55   DG Chest Portable 1 View  Result Date: 04/23/2020 CLINICAL DATA:  Altered mental status Hypertensive during dialysis Unresponsive EXAM: PORTABLE CHEST 1 VIEW COMPARISON:  04/12/2020 FINDINGS: Unchanged cardiomegaly. Scattered strandy lung opacities likely due to atelectasis. Dual lead left chest wall AICD unchanged in configuration. Dialysis catheter unchanged in position. IMPRESSION: Unchanged mild cardiomegaly. Electronically Signed   By: Miachel Roux M.D.   On: 04/23/2020 08:12   DG Chest Portable 1 View  Result Date: 04/12/2020 CLINICAL DATA:  Seizure activity, vomiting, history pacemaker, coronary artery disease, chronic systolic heart failure, diabetes mellitus, hypertension, ischemic cardiomyopathy, paroxysmal atrial fibrillation EXAM: PORTABLE CHEST 1 VIEW COMPARISON:  Portable exam 1345 hours compared to 01/23/2020 FINDINGS: RIGHT jugular dual-lumen central venous catheter with tips projecting over RIGHT atrium. LEFT subclavian AICD with leads projecting over RIGHT atrium and RIGHT ventricle. Enlargement of cardiac silhouette with vascular congestion. Stable mediastinal contours. Decreased lung volumes without gross infiltrate, pleural effusion or pneumothorax.  Osseous structures unremarkable. IMPRESSION: Enlargement of cardiac silhouette with pulmonary vascular congestion post ICD. No acute abnormalities. Electronically Signed   By: Lavonia Dana M.D.   On: 04/12/2020 13:57    Wt Readings from Last 3 Encounters:  04/23/20 (!) 165 kg  04/12/20 (!) 165 kg  01/25/20 (!) 164.5 kg    EKG: Currently sinus rhythm with first-degree AV block with intermittent ventricular pacing.  Physical Exam:  Male in no acute distress Blood pressure (!) 160/85, pulse 70, resp. rate 20, height '6\' 2"'$  (1.88 m), weight (!) 165 kg, SpO2 99 %. Body mass index is 46.7 kg/m. General: Well developed, well nourished, in no acute distress. Head: Normocephalic, atraumatic, sclera non-icteric, no xanthomas, nares are without discharge.  Neck: Negative for carotid bruits. JVD not elevated. Lungs: Clear bilaterally to auscultation without wheezes, rales, or rhonchi. Breathing is unlabored. Heart: RRR with S1 S2. No murmurs, rubs, or gallops appreciated. Abdomen: Soft, non-tender, non-distended with normoactive bowel sounds. No hepatomegaly. No rebound/guarding. No obvious abdominal masses. Msk:  Strength and tone appear normal for age. Extremities: No clubbing or cyanosis. No edema.  Distal pedal pulses are 2+ and equal bilaterally. Neuro: Alert and oriented X 3. No facial asymmetry. No focal deficit. Moves all extremities spontaneously. Psych:  Responds to questions appropriately with a normal affect.     Assessment and Plan   1.  Syncope-appears to be secondary to A. fib with RVR based on interrogation of AICD.  He is on amiodarone at 200 mg twice daily along with metoprolol succinate 100 mg daily and anticoagulated with apixaban.  .  Have discussed this with his primary cardiologist who agrees with this attempt.  Attempting to maintain sinus rhythm and avoiding A. fib is at goal.  He has also been compliant with and continues with  amlodipine 10 mg daily, atorvastatin 10 mg  daily, bumetanide 2 mg twice daily, Zetia 10 mg daily, isosorbide mononitrate 30 mg daily, metoprolol succinate 100 mg daily and Ranexa 500 mg twice daily.  Etiology of the events are unclear.  He is being evaluated by neurology.  Brain CT was unremarkable.  He was given Keppra.  No obvious seizure activity.  Has an EEG pending. Has history of pseudoseizures Has frequent apneic episodes. No sustained afib or vt noted on telemetry or on interogation.   2.  End-stage renal expertise-is on Monday Wednesday Friday dialysis.    Agree with consultation per nephrology to evaluate timing of further dialysis.  3.  Sleep apnea-continue with BiPAP at home.  4. CAD-fibrillation . Essential hypertensiona andCKD 5. [facility] with n[facility]i. H underwent cardiac cath in 12/21 which revealed80% ostial D1 stenosis as well as 90% ISR of the mid-LAD at the bifurcation of D1 now s/p successful PCI to D1  with a 2.25x15 Xience Skypoint DES. . 90% in-stent restenosis of the distal RCA also appreciated and new compared to 07/2019 study. IVUS was performed and confirmed ISR with well-expanded stents. Now s/p PTCA of the distal RCA/rPDA with a 4.0x12 [place] balloon to high pressure.  Stay on brinlinta 90 bid.   Signed, Teodoro Spray MD 04/23/2020, 11:09 AM Pager: [phone]

## 2020-04-23 NOTE — H&P (Addendum)
History and Physical    Edwin [name] DOB: 09-20-1970 DOA: 04/23/2020  PCP: Caryl Never, MD   Patient coming from: Home  I have personally briefly reviewed patient's old medical records in [place]  Chief Complaint: Unresponsiveness  Most of the history was obtained from ER notes.  Patient is unable to provide any history at this time  HPI: Edwin [name] is a 50 y.o. male with medical history significant forESRD-HD, HTN, HLD, DM, PAF not on AC, CAD with stent, AICD, sCHF, gout, depression who presents from the dialysis center via EMS for evaluation of unresponsiveness.  Patient had gone to the dialysis center and was noted to be hypotensive at dialysis, his treatment was initiated and suddenly patient became unresponsive and pale.  EMS was called and when they arrived they said patient went into V. tach arrest and lost a pulse and so CPR was initiated.  Patient was also said to have had a seizure while EMS was there. EMS administered 2 mg of Versed, 1 g of calcium and a bolus of amiodarone. While in the ER patient had multiple episodes where he became unresponsive with no response to sternal rub, he was tense with his eyes rolled back and had sonorous respirations.  These episodes lasted a couple of minutes came back to his baseline mental status. During my evaluation in the ER patient was lethargic but opens eyes to sternal rub.  He is oriented to person and place while awake. Venous blood gas 7.36/44/24.9/34.5 Sodium 137, potassium 4.3, chloride 102, bicarb 23, glucose 216, BUN 27, creatinine 5.68, calcium 8.3, alkaline phosphatase 75, albumin 2.6, AST 24, ALT 16, total protein 6.7, troponin 87, white count 3.8, hemoglobin 10.6, hematocrit 34.1, MCV 89, RDW 15.7, platelet count 112 SARS coronavirus 2 PCR test is pending CT scan of the head without contrast shows normal appearing brain parenchyma.  No mass or hemorrhage. There is a degree of exophthalmos,  symmetric. Correlation with thyroid function advised in this regard. Slight calcification in the right carotid siphon region. EKG reviewed by me shows sinus rhythm with prolonged PR interval  ED Course: Patient is a 50 year old Caucasian male who was brought into the ER from dialysis for evaluation of unresponsiveness.  Patient was said to have had an episode of pulseless V. tach in the field and CPR was initiated.  He regained a pulse and was loaded with amiodarone in the field he also received a dose of calcium gluconate.  Patient continues to have episodes of unresponsiveness with deviation of his eyes and rigidity of his upper extremities.  He also had an episode of hypoxia in the ER with room air pulse oximetry in the 80s transiently.  Patient was loaded with Keppra due to concerns for possible new onset seizure.  He was recently hospitalized for similar episodes and was worked up for syncope.  He will be admitted to the hospital for further evaluation.  Review of Systems: As per HPI otherwise all systems reviewed and negative.    Past Medical History:  Diagnosis Date  . CAD (coronary artery disease)   . Chronic systolic heart failure ([place])   . Diabetes mellitus without complication ([place])   . Heart attack ([place])   . Hyperlipidemia   . Hypertension   . Ischemic cardiomyopathy   . Paroxysmal atrial fibrillation ([name])   . Renal disorder     Past Surgical History:  Procedure Laterality Date  . [employer] pacemaker    . CARDIAC  SURGERY    . IRRIGATION AND DEBRIDEMENT KNEE Left 12/17/2017   Procedure: LEFT KNEE DEBRIDEMENT AND SYNOVECTOMY;  Surgeon: Thornton Park, MD;  Location: [facility] OR[facility];  Service: Orthopedics;  Laterality: Left;  . JOINT REPLACEMENT    . LEFT HEART CATH AND CORONARY ANGIOGRAPHY Left 03/09/2018   Procedure: LEFT HEART CATH AND CORONARY ANGIOGRAPHY;  Surgeon: Nelva Bush, MD;  Location: [place] [facility];  Service: Cardiovascular;  Laterality: Left;  .  LEFT HEART CATH AND CORONARY ANGIOGRAPHY N/A 01/25/2020   Procedure: LEFT HEART CATH AND CORONARY ANGIOGRAPHY;  Surgeon: Isaias Cowman, MD;  Location: [facility];  Service: Cardiovascular;  Laterality: N/A;  . PERCUTANEOUS CORONARY STENT INTERVENTION (PCI-S)       reports that he has quit smoking. His smokeless tobacco use includes snuff. He reports that he does not drink alcohol and does not use drugs.  Allergies  Allergen Reactions  . Losartan     Hyperkalemia (K>6)  . Morphine Anxiety  . Morphine And Related Other (See Comments)    Overdose in hospital per patient report    Family History  Problem Relation Age of Onset  . Diabetes Mother   . Peripheral vascular disease Mother   . COPD Father      Prior to Admission medications   Medication Sig Start Date End Date Taking? Authorizing Provider  acetaminophen (TYLENOL) 500 MG tablet Take 500 mg by mouth every 6 (six) hours as needed.    [provider]  albuterol (PROVENTIL HFA;VENTOLIN HFA) 108 (90 Base) MCG/ACT inhaler Inhale 2 puffs into the lungs every 6 (six) hours as needed for wheezing or shortness of breath. 04/29/18   Schuyler Amor, MD  Alirocumab 150 MG/ML SOAJ Inject 150 mg as directed every 14 (fourteen) days.    [provider]  allopurinol (ZYLOPRIM) 100 MG tablet Take 200 mg by mouth daily. 03/18/18   [provider]  amiodarone (PACERONE) 200 MG tablet Take 1 tablet (200 mg total) by mouth 2 (two) times daily. 04/13/20   Shawna Clamp, MD  amLODipine (NORVASC) 10 MG tablet Take 10 mg by mouth daily. Patient not taking: Reported on 04/12/2020 09/30/18   [provider]  apixaban (ELIQUIS) 5 MG TABS tablet Take 5 mg by mouth 2 (two) times daily.    [provider]  atorvastatin (LIPITOR) 80 MG tablet Take 80 mg by mouth every evening.  09/14/17   [provider]  bumetanide (BUMEX) 2 MG tablet Take 2 mg by mouth 2 (two) times daily. 12/06/19    [provider]  cholecalciferol (VITAMIN D) 1000 units tablet Take 1,000 Units by mouth daily.    [provider]  ezetimibe (ZETIA) 10 MG tablet Take 10 mg by mouth daily. 10/14/17   [provider]  FLUoxetine (PROZAC) 40 MG capsule Take 80 mg by mouth daily. 12/04/17   [provider]  gabapentin (NEURONTIN) 300 MG capsule Take 300 mg by mouth 2 (two) times a day. 09/27/18   [provider]  insulin aspart (NOVOLOG) 100 UNIT/ML injection Inject 20 Units into the skin 3 (three) times daily with meals. Patient taking differently: Inject 15 Units into the skin 3 (three) times daily with meals. 01/25/20   Loletha Grayer, MD  insulin detemir (LEVEMIR) 100 UNIT/ML injection Inject 0.4 mLs (40 Units total) into the skin at bedtime. Patient taking differently: Inject 30 Units into the skin at bedtime. 01/25/20   Loletha Grayer, MD  isosorbide mononitrate (IMDUR) 30 MG 24  hr tablet Take 1 tablet (30 mg total) by mouth daily. Patient taking differently: Take 60 mg by mouth daily. Do not take on dialysis days 10/30/18   Vaughan Basta, MD  loperamide (IMODIUM) 2 MG capsule Take 2 mg by mouth as needed for diarrhea or loose stools.    [provider]  metoprolol succinate (TOPROL-XL) 100 MG 24 hr tablet Take 100 mg by mouth daily. 01/03/20   [provider]  mirtazapine (REMERON) 7.5 MG tablet Take 7.5 mg by mouth at bedtime.    [provider]  Multiple Vitamin (MULTIVITAMIN WITH MINERALS) TABS tablet Take 1 tablet by mouth daily. 10/31/18   Vaughan Basta, MD  nitroGLYCERIN (NITROSTAT) 0.4 MG SL tablet Place 0.4 mg under the tongue every 5 (five) minutes as needed for chest pain.    [provider]  omeprazole (PRILOSEC) 40 MG capsule Take 40 mg by mouth 2 (two) times daily. 11/25/19   [provider]  ondansetron (ZOFRAN) 4 MG tablet Take 4 mg by mouth every 12 (twelve) hours as needed for nausea. 01/04/20    [provider]  ranolazine (RANEXA) 500 MG 12 hr tablet Take 500 mg by mouth 2 (two) times daily.    [provider]  RENVELA 800 MG tablet Take 2,400 mg by mouth 3 (three) times daily with meals. 10/14/17   [provider]  Semaglutide,0.25 or 0.'5MG'$ /DOS, (OZEMPIC, 0.25 OR 0.5 MG/DOSE,) 2 MG/1.5ML SOPN Inject 0.5 mg into the skin once a week.    [provider]  ticagrelor (BRILINTA) 90 MG TABS tablet Take 90 mg by mouth 2 (two) times daily.    [provider]  VOLTAREN 1 % GEL Apply 2 g topically 3 (three) times daily as needed (left leg pain).  12/04/17   [provider]    Physical Exam: Vitals:   04/23/20 1100 04/23/20 1245 04/23/20 1300 04/23/20 1340  BP: (!) 150/134 (!) 157/87 (!) 145/78   Pulse: 79 74 73 76  Resp: 18     SpO2: 91% 93% 98% 100%  Weight:      Height:         Vitals:   04/23/20 1100 04/23/20 1245 04/23/20 1300 04/23/20 1340  BP: (!) 150/134 (!) 157/87 (!) 145/78   Pulse: 79 74 73 76  Resp: 18     SpO2: 91% 93% 98% 100%  Weight:      Height:        Constitutional: NAD, lethargic and arouses to deep sternal rub, morbidly obese Eyes: PERRL, lids and conjunctivae normal ENMT: Mucous membranes are moist.  Neck: normal, supple, no masses, no thyromegaly Respiratory: Bilateral air entry, no wheezing, no crackles. Normal respiratory effort. No accessory muscle use.  Cardiovascular: Regular rate and rhythm, no murmurs / rubs / gallops. No extremity edema. 2+ pedal pulses. No carotid bruits.  Abdomen: no tenderness, no masses palpated. No hepatosplenomegaly. Bowel sounds positive.  Central adiposity Musculoskeletal: no clubbing / cyanosis. No joint deformity upper and lower extremities.  Skin: no rashes, lesions, ulcers.  Neurologic: Unable to assess Psychiatric: Unable to assess   Labs on Admission: I have personally reviewed following labs and imaging studies  CBC: Recent Labs  Lab 04/23/20 0752  WBC  3.8*  HGB 10.6*  HCT 34.1*  MCV 89.0  PLT [id number]*   Basic Metabolic Panel: Recent Labs  Lab 04/23/20 0752  NA 137  K 4.3  CL 102  CO2 23  GLUCOSE 216*  BUN 27*  CREATININE 5.68*  CALCIUM 8.3*   GFR: Estimated Creatinine Clearance: 25.1 mL/min (A) (by C-G formula based on SCr of 5.68 mg/dL (H)). Liver Function Tests: Recent Labs  Lab 04/23/20 0752  AST 24  ALT 16  ALKPHOS 75  BILITOT 0.5  PROT 6.7  ALBUMIN 2.6*   No results for input(s): LIPASE, AMYLASE in the last 168 hours. No results for input(s): AMMONIA in the last 168 hours. Coagulation Profile: No results for input(s): INR, PROTIME in the last 168 hours. Cardiac Enzymes: No results for input(s): CKTOTAL, CKMB, CKMBINDEX, TROPONINI in the last 168 hours. BNP (last 3 results) No results for input(s): PROBNP in the last 8760 hours. HbA1C: No results for input(s): HGBA1C in the last 72 hours. CBG: Recent Labs  Lab 04/23/20 1144  GLUCAP 148*   Lipid Profile: No results for input(s): CHOL, HDL, LDLCALC, TRIG, CHOLHDL, LDLDIRECT in the last 72 hours. Thyroid Function Tests: No results for input(s): TSH, T4TOTAL, FREET4, T3FREE, THYROIDAB in the last 72 hours. Anemia Panel: No results for input(s): VITAMINB12, FOLATE, FERRITIN, TIBC, IRON, RETICCTPCT in the last 72 hours. Urine analysis:    Component Value Date/Time   COLORURINE STRAW (A) 10/28/2018 2145   APPEARANCEUR CLEAR (A) 10/28/2018 2145   LABSPEC 1.008 10/28/2018 2145   PHURINE 5.0 10/28/2018 2145   GLUCOSEU 50 (A) 10/28/2018 2145   HGBUR SMALL (A) 10/28/2018 2145   BILIRUBINUR NEGATIVE 10/28/2018 2145   [place] NEGATIVE 10/28/2018 2145   PROTEINUR 100 (A) 10/28/2018 2145   NITRITE NEGATIVE 10/28/2018 2145   LEUKOCYTESUR NEGATIVE 10/28/2018 2145    Radiological Exams on Admission: CT Head Wo Contrast  Result Date: 04/23/2020 CLINICAL DATA:  Altered mental status.  Hypertension. EXAM: CT HEAD WITHOUT CONTRAST TECHNIQUE: Contiguous axial  images were obtained from the base of the skull through the vertex without intravenous contrast. COMPARISON:  April 12, 2020. FINDINGS: Brain: Ventricles and sulci are normal in size and configuration. There is no intracranial mass, hemorrhage, extra-axial fluid collection, midline shift. Brain parenchyma appears unremarkable. No acute infarct evident. Vascular: No hyperdense vessel. There is mild calcification in the right carotid siphon region. Skull: The bony calvarium appears intact. Sinuses/Orbits: Paranasal sinuses are clear. There is a degree of exophthalmos bilaterally. Orbits appear symmetric bilaterally. Other: Mastoid air cells are clear. IMPRESSION: 1.  Normal appearing brain parenchyma.  No mass or hemorrhage. 2. There is a degree of exophthalmos, symmetric. Correlation with thyroid function advised in this regard. 3.  Slight calcification in the right carotid siphon region. Electronically Signed   By: Lowella Grip III M.D.   On: 04/23/2020 09:53   DG Chest Portable 1 View  Result Date: 04/23/2020 CLINICAL DATA:  Altered mental status Hypertensive during dialysis Unresponsive EXAM: PORTABLE CHEST 1 VIEW COMPARISON:  04/12/2020 FINDINGS: Unchanged cardiomegaly. Scattered strandy lung opacities likely due to atelectasis. Dual lead left chest wall AICD unchanged in configuration. Dialysis catheter unchanged in position. IMPRESSION: Unchanged mild cardiomegaly. Electronically Signed   By: Miachel Roux M.D.   On: 04/23/2020 08:12    EKG: Independently reviewed.  Sinus rhythm with prolonged PR interval  Assessment/Plan Principal Problem:   Recurrent episodes of unresponsiveness Active Problems:   ESRD (end stage renal disease) ([name])   CAD (coronary artery disease)   Type 2 diabetes mellitus with ESRD (end-stage renal disease) ([name])   Obesity, Class III, BMI 40-49.9 (morbid obesity) ([name])   Chronic systolic CHF (congestive heart failure) ([name])   Depression   Dilated cardiomyopathy  ([name])   OSA (obstructive sleep apnea)  Episodes of unresponsiveness with concern for possible syncopal convulsions Patient was brought in from the dialysis center for evaluation of unresponsiveness and has had what appears to be seizure-like activity (tenseness in his arms, deviation of his eyes and lethargy) He was recently hospitalized for similar episode and at that time was diagnosed with syncope secondary to A. fib with RVR following interrogation of his AICD. Patient was discharged on amiodarone, metoprolol and apixaban. Will place patient on seizure precautions Continue Keppra per neurology recommendations We will reconsult cardiology for further evaluation    History of chronic systolic heart failure Patient has dilated cardiomyopathy with an LVEF of 15 to 20% Continue Bumex and metoprolol Continue renal replacement therapy     History of coronary artery disease Continue metoprolol, nitrates, Ranexa, Brilinta and atorvastatin   Depression Continue Prozac and Remeron    History of paroxysmal atrial fibrillation Continue metoprolol and amiodarone for rate control Continue apixaban as primary prophylaxis for an acute stroke    Diabetes mellitus with complications of end-stage renal disease Dialysis days are Monday/ Wednesday/Friday We will request nephrology consult for renal replacement therapy Keep patient n.p.o. until mental status is at baseline Blood sugar checks every 4 hours     Morbid obesity (BMI 46) Complicates overall prognosis and care    Obstructive sleep apnea With apneic episodes Continue CPAP at bedtime Will need a repeat sleep study as an outpatient for further evaluation.        DVT prophylaxis: Apixaban Code Status: Full code Family Communication: Called and spoke to patient's wife Ms Osso over the phone.  She was irate and request that her husband be transferred to [facility] to the care of his cardiologist, Dr Rutherford Guys.  I  informed her that Dr. Ubaldo Glassing is the cardiologist who had seen him here the last time and he will be seeing the patient again today.  Will inform cardiology of patient's wife request. Disposition Plan: Back to previous home environment Consults called: Neurology/cardiology    Britain Anagnos MD [facility]     04/23/2020, 2:07 PM

## 2020-04-24 DIAGNOSIS — I2510 Atherosclerotic heart disease of native coronary artery without angina pectoris: Secondary | ICD-10-CM

## 2020-04-24 DIAGNOSIS — I420 Dilated cardiomyopathy: Secondary | ICD-10-CM

## 2020-04-24 LAB — BASIC METABOLIC PANEL
Anion gap: 12 (ref 5–15)
BUN: 35 mg/dL — ABNORMAL HIGH (ref 6–20)
CO2: 21 mmol/L — ABNORMAL LOW (ref 22–32)
Calcium: 7.6 mg/dL — ABNORMAL LOW (ref 8.9–10.3)
Chloride: 102 mmol/L (ref 98–111)
Creatinine, Ser: 6.45 mg/dL — ABNORMAL HIGH (ref 0.61–1.24)
GFR, Estimated: 10 mL/min — ABNORMAL LOW (ref >60)
Glucose, Bld: 169 mg/dL — ABNORMAL HIGH (ref 70–99)
Potassium: 4.7 mmol/L (ref 3.5–5.1)
Sodium: 135 mmol/L (ref 135–145)

## 2020-04-24 LAB — CBC
HCT: 30.7 % — ABNORMAL LOW (ref 39.0–52.0)
Hemoglobin: 9.9 g/dL — ABNORMAL LOW (ref 13.0–17.0)
MCH: 28.7 pg (ref 26.0–34.0)
MCHC: 32.2 g/dL (ref 30.0–36.0)
MCV: 89 fL (ref 80.0–100.0)
Platelets: 149 10*3/uL — ABNORMAL LOW (ref 150–400)
RBC: 3.45 MIL/uL — ABNORMAL LOW (ref 4.22–5.81)
RDW: 15.7 % — ABNORMAL HIGH (ref 11.5–15.5)
WBC: 5 10*3/uL (ref 4.0–10.5)
nRBC: 0 % (ref 0.0–0.2)

## 2020-04-24 LAB — TROPONIN I (HIGH SENSITIVITY): Troponin I (High Sensitivity): 57 ng/L — ABNORMAL HIGH (ref <18)

## 2020-04-24 LAB — CBG MONITORING, ED
Glucose-Capillary: 153 mg/dL — ABNORMAL HIGH (ref 70–99)
Glucose-Capillary: 157 mg/dL — ABNORMAL HIGH (ref 70–99)
Glucose-Capillary: 201 mg/dL — ABNORMAL HIGH (ref 70–99)
Glucose-Capillary: 202 mg/dL — ABNORMAL HIGH (ref 70–99)
Glucose-Capillary: 217 mg/dL — ABNORMAL HIGH (ref 70–99)
Glucose-Capillary: 99 mg/dL (ref 70–99)

## 2020-04-24 MED ORDER — CHLORHEXIDINE GLUCONATE CLOTH 2 % EX PADS
6.0000 | MEDICATED_PAD | Freq: Every day | CUTANEOUS | Status: DC
  Filled 2020-04-24 (×2): qty 6

## 2020-04-24 MED ORDER — EPOETIN ALFA 10000 UNIT/ML IJ SOLN
10000.0000 [IU] | INTRAMUSCULAR | Status: DC
  Administered 2020-04-24: 10000 [IU] via INTRAVENOUS
  Filled 2020-04-24: qty 1

## 2020-04-24 NOTE — Consult Note (Signed)
[name] Date of Encounter: 04/24/2020  Hospital Problem List     Principal Problem:   Recurrent episodes of unresponsiveness Active Problems:   ESRD (end stage renal disease) ([place])   CAD (coronary artery disease)   Type 2 diabetes mellitus with ESRD (end-stage renal disease) ([name])   Obesity, Class III, BMI 40-49.9 (morbid obesity) ([name])   Chronic systolic CHF (congestive heart failure) ([name])   Depression   Dilated cardiomyopathy ([name])   OSA (obstructive sleep apnea)    Patient Profile     50 y.o. male with history of Coronary artery disease involving native coronary artery of native heart without angina pectoris,Chronic heart failure with reduced ejection fraction and diastolic dysfunction Paroxysmal atrial fibrillation . Essential hypertensiona andCKD 5. [facility]. H underwent cardiac cath in 12/21 which revealed80% ostial D1 stenosis as well as 90%  ISR of the mid-LAD at the bifurcation of D1 now s/p successful PCI to D1  with a 2.25x15 Xience Skypoint DES. . 90% in-stent restenosis of the distal RCA also appreciated and new compared to 07/2019 study. IVUS was performed and confirmed ISR with well-expanded stents. Now s/p PTCA of the distal RCA/rPDA with a 4.0x12 [place] balloon to high pressure.  Elevated left ventricular filling pressures (LVEDP = 30 mm Hg). He has a boaston aicd pacemaker, sleep apnea, He was seen 2 weeks ago with syncope and was noted to have episodes of afib with rvr at the time of his events. AICD did not fire. He was brought to the er this admission after noted to be hypotensive during HD and then becam unresponsive. He was felt to be in VT by EMS. He underwent CPR and was given a bolus of iv amiodarone. He was noted to be iintermitantly unresponsive in the er with response to sternal rubs. Electrolytes are normal. hsTrop was unremarkable. COVID positive. Head CT unremarkable. Episodes of unresponsiveness  in the er are associated with hypoxia.  Patient states that he has been doing reasonably well although is mourning the death of a brother from cancer.  He was first on the scene when he had sudden death.  He reports that he is compliant with his medications.  Been having episodes of unresponsiveness.  As mentioned above, was unresponsive after a period of hypotension during hemodialysis.  Was felt to be in V. tach and was given amiodarone.  He has had further episodes in the ER although rhythm appears to be sinus rhythm with intermittent ventricular paced beats.  There are some nonsustained runs on telemetry.  Interrogation of his device revealed nonsustained brief runs of vt.  Intermittent nonsustained afib. Currently in nsr or ap\ vs  Subjective   Less sob and more alert today. Denies chest pain   Inpatient Medications    . allopurinol  200 mg Oral Daily  . amiodarone  200 mg Oral BID  . apixaban  5 mg Oral BID  . atorvastatin  80 mg Oral QPM  . bumetanide  2 mg Oral BID  . Chlorhexidine Gluconate Cloth  6 each Topical Q0600  . cholecalciferol  1,000 Units Oral Daily  . epoetin (EPOGEN/PROCRIT) injection  10,000 Units Intravenous Q T,Th,Sa-HD  . ezetimibe  10 mg Oral Daily  . FLUoxetine  80 mg Oral Daily  . gabapentin  100 mg Oral BID  . insulin aspart  0-6 Units Subcutaneous Q4H  . isosorbide mononitrate  60 mg Oral Daily  . metoprolol succinate  100 mg Oral Daily  . mirtazapine  7.5 mg Oral QHS  . multivitamin with minerals  1 tablet Oral Daily  . pantoprazole  40 mg Oral Daily  . ranolazine  500 mg Oral BID  . sevelamer carbonate  2,400 mg Oral TID WC  . ticagrelor  90 mg Oral BID    Vital Signs    Vitals:   04/24/20 0745 04/24/20 1000 04/24/20 1230 04/24/20 1245  BP:  97/73 119/85 112/63  Pulse: 70 71 73 71  Resp: '14 20 13 12  '$ Temp:      TempSrc:      SpO2: 98% 98% 97% 97%  Weight:      Height:        Intake/Output Summary (Last 24 hours) at 04/24/2020 1622 Last  data filed at 04/24/2020 0421 Gross per 24 hour  Intake 348.6 ml  Output --  Net 348.6 ml   Filed Weights   04/23/20 0753  Weight: (!) 165 kg    Physical Exam    GEN: Well nourished, well developed, in no acute distress.  HEENT: normal.  Neck: Supple, no JVD, carotid bruits, or masses. Cardiac: RRR, no murmurs, rubs, or gallops. No clubbing, cyanosis, edema.  Radials/DP/PT 2+ and equal bilaterally.  Respiratory:  Respirations regular and unlabored, clear to auscultation bilaterally. GI: Soft, nontender, nondistended, BS + x 4. MS: no deformity or atrophy. Skin: warm and dry, no rash. Neuro:  Strength and sensation are intact. Psych: Normal affect.  Labs    CBC Recent Labs    04/23/20 0752 04/24/20 0429  WBC 3.8* 5.0  HGB 10.6* 9.9*  HCT 34.1* 30.7*  MCV 89.0 89.0  PLT 112* [id number]*   Basic Metabolic Panel Recent Labs    04/23/20 0752 04/24/20 0429  NA 137 135  K 4.3 4.7  CL 102 102  CO2 23 21*  GLUCOSE 216* 169*  BUN 27* 35*  CREATININE 5.68* 6.45*  CALCIUM 8.3* 7.6*   Liver Function Tests Recent Labs    04/23/20 0752  AST 24  ALT 16  ALKPHOS 75  BILITOT 0.5  PROT 6.7  ALBUMIN 2.6*   No results for input(s): LIPASE, AMYLASE in the last 72 hours. Cardiac Enzymes No results for input(s): CKTOTAL, CKMB, CKMBINDEX, TROPONINI in the last 72 hours. BNP No results for input(s): BNP in the last 72 hours. D-Dimer No results for input(s): DDIMER in the last 72 hours. Hemoglobin A1C No results for input(s): HGBA1C in the last 72 hours. Fasting Lipid Panel No results for input(s): CHOL, HDL, LDLCALC, TRIG, CHOLHDL, LDLDIRECT in the last 72 hours. Thyroid Function Tests No results for input(s): TSH, T4TOTAL, T3FREE, THYROIDAB in the last 72 hours.  Invalid input(s): [employer]    nsr with intermittent a pace, v sense.   ECG    nsr  Radiology    EEG  Result Date: 04/23/2020 Lora Havens, MD     04/23/2020  5:47 PM Patient Name: Gabrel Aujla Junious MRN: [id number] Epilepsy Attending: Lora Havens Referring Physician/Provider: Dr Amie Portland Date: 04/23/2020 Duration: 21.32 mins Patient history: 50yo M with seizure like activity. EEG to evaluate for seizure. Level of alertness: Awake AEDs during EEG study: GBP, LEV Technical aspects: This EEG study was done with scalp electrodes positioned according to the 10-20 International system of electrode placement. Electrical activity was acquired at a sampling rate of '500Hz'$  and reviewed with a high frequency filter of '70Hz'$  and a low frequency filter of '1Hz'$ . EEG  data were recorded continuously and digitally stored. Description: The posterior dominant rhythm consists of 8 Hz activity of moderate voltage (25-35 uV) seen predominantly in posterior head regions, symmetric and reactive to eye opening and eye closing. Hyperventilation and photic stimulation were not performed.   IMPRESSION: This study is within normal limits. No seizures or epileptiform discharges were seen throughout the recording. Lora Havens   CT Head Wo Contrast  Result Date: 04/23/2020 CLINICAL DATA:  Altered mental status.  Hypertension. EXAM: CT HEAD WITHOUT CONTRAST TECHNIQUE: Contiguous axial images were obtained from the base of the skull through the vertex without intravenous contrast. COMPARISON:  April 12, 2020. FINDINGS: Brain: Ventricles and sulci are normal in size and configuration. There is no intracranial mass, hemorrhage, extra-axial fluid collection, midline shift. Brain parenchyma appears unremarkable. No acute infarct evident. Vascular: No hyperdense vessel. There is mild calcification in the right carotid siphon region. Skull: The bony calvarium appears intact. Sinuses/Orbits: Paranasal sinuses are clear. There is a degree of exophthalmos bilaterally. Orbits appear symmetric bilaterally. Other: Mastoid air cells are clear. IMPRESSION: 1.  Normal appearing brain parenchyma.  No mass or hemorrhage. 2. There is a  degree of exophthalmos, symmetric. Correlation with thyroid function advised in this regard. 3.  Slight calcification in the right carotid siphon region. Electronically Signed   By: Lowella Grip III M.D.   On: 04/23/2020 09:53   CT Head Wo Contrast  Result Date: 04/12/2020 CLINICAL DATA:  50 year old male with a history mental status changes EXAM: CT HEAD WITHOUT CONTRAST TECHNIQUE: Contiguous axial images were obtained from the base of the skull through the vertex without intravenous contrast. COMPARISON:  07/08/2018 FINDINGS: Brain: No acute intracranial hemorrhage. No midline shift or mass effect. Gray-white differentiation maintained. Unremarkable appearance of the ventricular system. Vascular: Unremarkable. Skull: No acute fracture.  No aggressive bone lesion identified. 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 Unremarkable paranasal sinuses which are clear. No mastoid effusion. Other: None IMPRESSION: Negative for acute intracranial abnormality. Interval development of bilateral exophthalmos, which can be seen with thyroid disease. Correlation with thyroid labs recommended. Electronically Signed   By: Corrie Mckusick D.O.   On: 04/12/2020 14:55   DG Chest Portable 1 View  Result Date: 04/23/2020 CLINICAL DATA:  Altered mental status Hypertensive during dialysis Unresponsive EXAM: PORTABLE CHEST 1 VIEW COMPARISON:  04/12/2020 FINDINGS: Unchanged cardiomegaly. Scattered strandy lung opacities likely due to atelectasis. Dual lead left chest wall AICD unchanged in configuration. Dialysis catheter unchanged in position. IMPRESSION: Unchanged mild cardiomegaly. Electronically Signed   By: Miachel Roux M.D.   On: 04/23/2020 08:12   DG Chest Portable 1 View  Result Date: 04/12/2020 CLINICAL DATA:  Seizure activity, vomiting, history pacemaker, coronary artery disease, chronic systolic heart failure, diabetes  mellitus, hypertension, ischemic cardiomyopathy, paroxysmal atrial fibrillation EXAM: PORTABLE CHEST 1 VIEW COMPARISON:  Portable exam 1345 hours compared to 01/23/2020 FINDINGS: RIGHT jugular dual-lumen central venous catheter with tips projecting over RIGHT atrium. LEFT subclavian AICD with leads projecting over RIGHT atrium and RIGHT ventricle. Enlargement of cardiac silhouette with vascular congestion. Stable mediastinal contours. Decreased lung volumes without gross infiltrate, pleural effusion or pneumothorax. Osseous structures unremarkable. IMPRESSION: Enlargement of cardiac silhouette with pulmonary vascular congestion post ICD. No acute abnormalities. Electronically Signed   By: Lavonia Dana M.D.   On: 04/12/2020 13:57    Assessment & Plan     1. Syncope-appears to be secondary to A. fib with  RVR based on interrogation of AICD. He is on amiodarone at 200 mg twice daily along with metoprolol succinate 100 mg daily and anticoagulated with apixaban. . Have discussed this with his primary cardiologist who agrees with this attempt. Attempting to maintain sinus rhythm and avoiding A. fib is at goal.  He has also been compliant with and continues with  amlodipine 10 mg daily, atorvastatin 10 mg daily, bumetanide 2 mg twice daily, Zetia 10 mg daily, isosorbide mononitrate 30 mg daily, metoprolol succinate 100 mg daily and Ranexa 500 mg twice daily.  Etiology of the events are unclear.  He is being evaluated by neurology.  Brain CT was unremarkable.  He was given Keppra.  No obvious seizure activity.  Has an EEG pending. Has history of pseudoseizures Has frequent apneic episodes. No sustained afib or vt noted on telemetry or on interogation.  Will continue to follow on amiodarone and metoprolol  2. End-stage renal expertise-is on Monday Wednesday Friday dialysis.   Agree with consultation per nephrology to evaluate timing of further dialysis.  3. Sleep apnea-continue with BiPAP .  4.  CAD-fibrillation . Essential hypertensiona andCKD 5. [facility] with [facility]i. H underwent cardiac cathin 12/21which revealed80% ostial D1 stenosis as well as 90% ISR of the mid-LAD at the bifurcation of D1 now s/p successful PCI to D1  with a 2.25x15 Xience Skypoint DES. . 90% in-stent restenosis of the distal RCA also appreciated and new compared to 07/2019 study. IVUS was performed and confirmed ISR with well-expanded stents. Now s/p PTCA of the distal RCA/rPDA with a 4.0x12 [place] balloon to high pressure.  Stay on brinlinta 90 bid.    Signed, Javier Docker Jonanthan Bolender MD 04/24/2020, 4:22 PM  Pager: [phone]

## 2020-04-24 NOTE — Progress Notes (Signed)
[facility]  ROUNDING NOTE   Subjective:   [name] was admitted to [facility] on 04/23/2020 for Recurrent episodes of unresponsiveness [R41.89]  Seen and examined on hemodialysis treatment. Tolerating treatment well.     HEMODIALYSIS FLOWSHEET:  Blood Flow Rate (mL/min): 400 mL/min Arterial Pressure (mmHg): -170 mmHg Venous Pressure (mmHg): 170 mmHg Transmembrane Pressure (mmHg): 50 mmHg Ultrafiltration Rate (mL/min): 1000 mL/min Dialysate Flow Rate (mL/min): 600 ml/min Conductivity: Machine : 14 Conductivity: Machine : 14 Dialysis Fluid Bolus: Normal Saline Bolus Amount (mL): 250 mL    Found to be COVID-19+.   Patient was admitted to [facility] from 1/13 to 1/14 for syncope.    Objective:  Vital signs in last 24 hours:  Temp:  [98.2 F (36.8 C)-99 F (37.2 C)] 99 F (37.2 C) (01/24 2226) Pulse Rate:  [69-105] 70 (01/25 0745) Resp:  [10-32] 14 (01/25 0745) BP: (106-180)/(60-136) 127/80 (01/25 0730) SpO2:  [89 %-100 %] 98 % (01/25 0745)  Weight change:  Filed Weights   04/23/20 0753  Weight: (!) 165 kg    Intake/Output: I/O last 3 completed shifts: In: 348.6 [I.V.:348.6] Out: -    Intake/Output this shift:  No intake/output data recorded.  Physical Exam: General: NAD, seated in chair  Head: Normocephalic, atraumatic. Moist oral mucosal membranes  Eyes: Anicteric, PERRL  Neck: Supple, trachea midline  Lungs:  Clear to auscultation  Heart: Regular rate and rhythm  Abdomen:  Soft, nontender,   Extremities: + peripheral edema.  Neurologic: nonfocal.   Skin: No lesions  Access: RIJ [name]    Basic Metabolic Panel: Recent Labs  Lab 04/23/20 0752 04/24/20 0429  NA 137 135  K 4.3 4.7  CL 102 102  CO2 23 21*  GLUCOSE 216* 169*  BUN 27* 35*  CREATININE 5.68* 6.45*  CALCIUM 8.3* 7.6*    Liver Function Tests: Recent Labs  Lab 04/23/20 0752  AST 24  ALT 16  ALKPHOS 75  BILITOT 0.5  PROT 6.7  ALBUMIN 2.6*   No results for  input(s): LIPASE, AMYLASE in the last 168 hours. No results for input(s): AMMONIA in the last 168 hours.  CBC: Recent Labs  Lab 04/23/20 0752 04/24/20 0429  WBC 3.8* 5.0  HGB 10.6* 9.9*  HCT 34.1* 30.7*  MCV 89.0 89.0  PLT 112* 149*    Cardiac Enzymes: No results for input(s): CKTOTAL, CKMB, CKMBINDEX, TROPONINI in the last 168 hours.  BNP: Invalid input(s): POCBNP  CBG: Recent Labs  Lab 04/23/20 1713 04/23/20 2013 04/24/20 0004 04/24/20 0427 04/24/20 0723  GLUCAP 128* 133* 202* 153* 36*    Microbiology: Results for orders placed or performed during the hospital encounter of 04/23/20  SARS Coronavirus 2 by RT PCR (hospital order, performed in [facility] hospital lab) Nasopharyngeal Nasopharyngeal Swab     Status: Abnormal   Collection Time: 04/23/20  8:15 AM   Specimen: Nasopharyngeal Swab  Result Value Ref Range Status   SARS Coronavirus 2 POSITIVE (A) NEGATIVE Final    Comment: RESULT CALLED TO, READ BACK BY AND VERIFIED WITH: SAMANTHA HAMILTON ON 04/23/20 AT 40 QSD (NOTE) SARS-CoV-2 target nucleic acids are DETECTED  SARS-CoV-2 RNA is generally detectable in upper respiratory specimens  during the acute phase of infection.  Positive results are indicative  of the presence of the identified virus, but do not rule out bacterial infection or co-infection with other pathogens not detected by the test.  Clinical correlation with patient history and  other diagnostic information is necessary to  determine patient infection status.  The expected result is negative.  Fact Sheet for Patients:   StrictlyIdeas.no   Fact Sheet for Healthcare Providers:   BankingDealers.co.za    This test is not yet approved or cleared by the [place] FDA and  has been authorized for detection and/or diagnosis of SARS-CoV-2 by FDA under an Emergency Use Authorization (EUA).  This EUA will remain in effect (meaning t his test can  be used) for the duration of  the COVID-19 declaration under Section 564(b)(1) of the Act, 21 U.S.C. section 360-bbb-3(b)(1), unless the authorization is terminated or revoked sooner.  Performed at [facility], [address] [place], [place] [address]     Coagulation Studies: No results for input(s): LABPROT, INR in the last 72 hours.  Urinalysis: No results for input(s): COLORURINE, LABSPEC, PHURINE, GLUCOSEU, HGBUR, BILIRUBINUR, KETONESUR, PROTEINUR, UROBILINOGEN, NITRITE, LEUKOCYTESUR in the last 72 hours.  Invalid input(s): APPERANCEUR    Imaging: EEG  Result Date: 04/23/2020 Lora Havens, MD     04/23/2020  5:47 PM [name] MRN: [id number] Epilepsy Attending: Lora Havens Referring Physician/Provider: Dr Edwin [name] Date: 04/23/2020 Duration: 21.32 mins Patient history: 50yo M with seizure like activity. EEG to evaluate for seizure. Level of alertness: Awake AEDs during EEG study: GBP, LEV Technical aspects: This EEG study was done with scalp electrodes positioned according to the 10-20 International system of electrode placement. Electrical activity was acquired at a sampling rate of '500Hz'$  and reviewed with a high frequency filter of '70Hz'$  and a low frequency filter of '1Hz'$ . EEG data were recorded continuously and digitally stored. Description: The posterior dominant rhythm consists of 8 Hz activity of moderate voltage (25-35 uV) seen predominantly in posterior head regions, symmetric and reactive to eye opening and eye closing. Hyperventilation and photic stimulation were not performed.   IMPRESSION: This study is within normal limits. No seizures or epileptiform discharges were seen throughout the recording. Lora Havens   CT Head Wo Contrast  Result Date: 04/23/2020 CLINICAL DATA:  Altered mental status.  Hypertension. EXAM: CT HEAD WITHOUT CONTRAST TECHNIQUE: Contiguous axial images were obtained from the base of the skull through the  vertex without intravenous contrast. COMPARISON:  April 12, 2020. FINDINGS: Brain: Ventricles and sulci are normal in size and configuration. There is no intracranial mass, hemorrhage, extra-axial fluid collection, midline shift. Brain parenchyma appears unremarkable. No acute infarct evident. Vascular: No hyperdense vessel. There is mild calcification in the right carotid siphon region. Skull: The bony calvarium appears intact. Sinuses/Orbits: Paranasal sinuses are clear. There is a degree of exophthalmos bilaterally. Orbits appear symmetric bilaterally. Other: Mastoid air cells are clear. IMPRESSION: 1.  Normal appearing brain parenchyma.  No mass or hemorrhage. 2. There is a degree of exophthalmos, symmetric. Correlation with thyroid function advised in this regard. 3.  Slight calcification in the right carotid siphon region. Electronically Signed   By: Lowella Grip III M.D.   On: 04/23/2020 09:53   DG Chest Portable 1 View  Result Date: 04/23/2020 CLINICAL DATA:  Altered mental status Hypertensive during dialysis Unresponsive EXAM: PORTABLE CHEST 1 VIEW COMPARISON:  04/12/2020 FINDINGS: Unchanged cardiomegaly. Scattered strandy lung opacities likely due to atelectasis. Dual lead left chest wall AICD unchanged in configuration. Dialysis catheter unchanged in position. IMPRESSION: Unchanged mild cardiomegaly. Electronically Signed   By: Miachel Roux M.D.   On: 04/23/2020 08:12     Medications:   . dextrose 5 % and 0.9% NaCl Stopped (04/24/20 0422)   .  allopurinol  200 mg Oral Daily  . amiodarone  200 mg Oral BID  . apixaban  5 mg Oral BID  . atorvastatin  80 mg Oral QPM  . bumetanide  2 mg Oral BID  . Chlorhexidine Gluconate Cloth  6 each Topical Q0600  . cholecalciferol  1,000 Units Oral Daily  . ezetimibe  10 mg Oral Daily  . FLUoxetine  80 mg Oral Daily  . gabapentin  100 mg Oral BID  . insulin aspart  0-6 Units Subcutaneous Q4H  . isosorbide mononitrate  60 mg Oral Daily  .  metoprolol succinate  100 mg Oral Daily  . mirtazapine  7.5 mg Oral QHS  . multivitamin with minerals  1 tablet Oral Daily  . pantoprazole  40 mg Oral Daily  . ranolazine  500 mg Oral BID  . sevelamer carbonate  2,400 mg Oral TID WC  . ticagrelor  90 mg Oral BID   acetaminophen, albuterol, nitroGLYCERIN, ondansetron **OR** ondansetron (ZOFRAN) IV  Assessment/ Plan:  [name] is a 50 y.o.  male with end stage renal disease on hemodialysis, hypertension, hyperlipidemia, diabetes mellitus type II, atrial fibrillation, coronary artery disease, systolic congestive heart failure, gout, depression who is admitted to [facility] on 04/23/2020 for Recurrent episodes of unresponsiveness [R41.89]  Edwin [name]  1. End stage renal disease: seen and examined on hemodialysis treatment.   2. Hypertension: home regimen of bumetanide, amlodipine, metoprolol  3. Anemia of chronic kidney disease: Mircera as outpatient.  - EPO with HD treatment  4. Secondary Hyperparathyroidism:  - sevelamer with meals.    LOS: 1 Edwin [name] 1/25/20229:04 AM

## 2020-04-24 NOTE — Progress Notes (Signed)
at [place] NAME: Edwin [name]    MR#:  [id number]  DATE OF BIRTH:  [birth year]  SUBJECTIVE:  CHIEF COMPLAINT:   Chief Complaint  Patient presents with  . Altered Mental Status    REVIEW OF SYSTEMS:  ROS DRUG ALLERGIES:   Allergies  Allergen Reactions  . Losartan     Hyperkalemia (K>6)  . Morphine Anxiety  . Morphine And Related Other (See Comments)    Overdose in hospital per patient report   VITALS:  Blood pressure 104/68, pulse 70, temperature 98.2 F (36.8 C), temperature source Oral, resp. rate 18, height '6\' 2"'$  (1.88 m), weight (!) 165 kg, SpO2 97 %. PHYSICAL EXAMINATION:  Physical Exam LABORATORY PANEL:  Male CBC Recent Labs  Lab 04/24/20 0429  WBC 5.0  HGB 9.9*  HCT 30.7*  PLT 149*   ------------------------------------------------------------------------------------------------------------------ Chemistries  Recent Labs  Lab 04/23/20 0752 04/24/20 0429  NA 137 135  K 4.3 4.7  CL 102 102  CO2 23 21*  GLUCOSE 216* 169*  BUN 27* 35*  CREATININE 5.68* 6.45*  CALCIUM 8.3* 7.6*  AST 24  --   ALT 16  --   ALKPHOS 75  --   BILITOT 0.5  --    RADIOLOGY:  No results found. ASSESSMENT AND PLAN:  50 y.o. male with K/H/O for ESRD-HD, HTN, HLD, DM, PAF not on AC, CAD with stent, AICD, sCHF, gout, depression who presents from the dialysis center via EMS for evaluation of unresponsiveness with some concern for seizure  1/25: patient adamant in leaving AMA but after explaining the risks he has agreed to stay. Per wife he wants to leave due to his brother's funeral. Patient hypotensive and waiting to get HD today.   Unresponsiveness/seizure like activity/cardiogenic syncope Per neuro this is not seizure and don't need antiepileptics. Normal EEG and brain imaging. Pseudoseizures is in differential He is back to his baseline mental status. Per [place] law - he shouldn't drive for 6 months  Cardiogenic syncope Secondary to  a.fib with RVR based on AICD interrogation  Continue amiodarone & metoprolol for rate control Eliquis for anticoagulation  CAD/severe cardiomyopathy with LVEF 15-20% Recent cath at [facility] (12/21) s/p successful PCI and DES Continue brilanta, Toprol, Imdur, Bumex, Lipitor & Ranexa  ESRD on HD ([name]) HD planned for today per nephro  Anemia of CKD Epo with HD per neprho  Essential Hypertension Continue imdur, toprol, bumex  Depression Continue Prozac, Remeron  OSA Counseled for CPAP  Morbid Obesity Body mass index is 46.7 kg/m.  Net IO Since Admission: 348.6 mL [04/24/20 1856]     Despite our best attempts to convince him in staying over - he might leave AMA. He understands the risks in leaving AMA including worsening clinical condition and death.  Status is: Inpatient  Remains inpatient appropriate because:Inpatient level of care appropriate due to severity of illness   Dispo: The patient is from: Home              Anticipated d/c is to: Home              Anticipated d/c date is: 2 days              Patient currently is not medically stable to d/c.    Difficult to place patient No   DVT prophylaxis:        apixaban (ELIQUIS) tablet 5 mg     Family Communication: updated wife over phone on  1/25. She is yelling and cursing at me if he leaves the hospital. I've asked her that I'm not planning on D/Cing him today as he needs HD and would like to monitor him for any recurrence of cardiac arrhythmia and BP as he's running low considering his severe cardiomyopathy (EF 15-20%). She is demanding to keep him in the Hospital till his covid isolation is over (she claims it till at least Friday) and she will not pick him up or allow him at home.   All the records are reviewed and case discussed with Care Management/Social Worker. Management plans discussed with the patient, family and they are in agreement.  CODE STATUS: Full Code Level of care: Stepdown  TOTAL TIME TAKING CARE  OF THIS PATIENT: 35 minutes.   More than 50% of the time was spent in counseling/coordination of care: YES  POSSIBLE D/C IN 1-2 DAYS, DEPENDING ON CLINICAL CONDITION.   Max Sane M.D on 04/24/2020 at 6:56 PM  [facility]   CC: Primary care physician; Caryl Never, MD  Note: This dictation was prepared with Dragon dictation along with smaller phrase technology. Any transcriptional errors that result from this process are unintentional.

## 2020-04-24 NOTE — ED Notes (Signed)
Pt given breakfast tray

## 2020-04-24 NOTE — ED Notes (Signed)
Hospitalist at bedside 

## 2020-04-24 NOTE — ED Notes (Signed)
Pt AOx4, resting in bed in NAD. Denies any pain or needs. States that he thinks he could go home today. Pt in NSR at this time. VSS.

## 2020-04-24 NOTE — ED Notes (Signed)
Pt given meal tray.

## 2020-04-24 NOTE — ED Notes (Signed)
Pt returned from dialysis at this time

## 2020-04-24 NOTE — Progress Notes (Signed)
Hemodialysis patient known at [facility] [name], due to being Coivd + patient will need to treat at the cohort clinic in [place]. Spoke with patient wife and had concerns about transportation. Spoke with Ander Purpura at [facility] who stated that they will work on getting transportation arranged starting on Thursday. Wife is aware but then expressed concerns about patient leaving AMA or before medically stable. She stated that the patient "only wants to leave because of his brothers funeral" she also stated that "I will refuse to pick [place] up if he leaves AMA", I informed wife that I would pass this information along. Please contact me with any dialysis placement concerns.  Elvera Bicker Dialysis Coordinator [phone]

## 2020-04-24 NOTE — Progress Notes (Signed)
Neurology Progress Note   S:// Seen and examined.  Comfortably sitting in bed and eating breakfast.  Was on the phone speaking with the family member.   O:// Current vital signs: BP 127/80   Pulse 70   Temp 99 F (37.2 C) (Oral)   Resp 14   Ht '6\' 2"'$  (1.88 m)   Wt (!) 165 kg   SpO2 98%   BMI 46.70 kg/m  Vital signs in last 24 hours: Temp:  [98.2 F (36.8 C)-99 F (37.2 C)] 99 F (37.2 C) (01/24 2226) Pulse Rate:  [69-105] 70 (01/25 0745) Resp:  [10-32] 14 (01/25 0745) BP: (106-180)/(60-136) 127/80 (01/25 0730) SpO2:  [89 %-100 %] 98 % (01/25 0745) General: Awake alert in no distress HEENT: Normocephalic atraumatic Lungs: Clear Cardiovascular: Regular rhythm Abdomen soft nondistended nontender Extremities: Well perfused Neurologic exam Awake alert oriented x3 No aphasia No dysarthria Cranial nerves-pupils equal round react light, extraocular movements intact, visual fields full, face symmetric and facial sensation intact, auditory daily intact, tongue and palate midline. Motor exam: 5/5 in all fours antigravity without drift except for a left foot drop.  No asterixis Sensory exam: Intact to touch Coordination: No ataxia.  No asterixis DTRs: Absent all over   Medications  Current Facility-Administered Medications:  .  acetaminophen (TYLENOL) tablet 500 mg, 500 mg, Oral, Q6H PRN, Agbata, Tochukwu, MD .  albuterol (VENTOLIN HFA) 108 (90 Base) MCG/ACT inhaler 2 puff, 2 puff, Inhalation, Q6H PRN, Agbata, Tochukwu, MD .  allopurinol (ZYLOPRIM) tablet 200 mg, 200 mg, Oral, Daily, Agbata, Tochukwu, MD, 200 mg at 04/23/20 1740 .  amiodarone (PACERONE) tablet 200 mg, 200 mg, Oral, BID, Agbata, Tochukwu, MD, 200 mg at 04/23/20 1906 .  apixaban (ELIQUIS) tablet 5 mg, 5 mg, Oral, BID, Agbata, Tochukwu, MD, 5 mg at 04/23/20 1911 .  atorvastatin (LIPITOR) tablet 80 mg, 80 mg, Oral, QPM, Agbata, Tochukwu, MD, 80 mg at 04/23/20 1740 .  bumetanide (BUMEX) tablet 2 mg, 2 mg, Oral,  BID, Agbata, Tochukwu, MD, 2 mg at 04/23/20 1906 .  cholecalciferol (VITAMIN D) tablet 1,000 Units, 1,000 Units, Oral, Daily, Agbata, Tochukwu, MD, 1,000 Units at 04/23/20 1909 .  dextrose 5 %-0.9 % sodium chloride infusion, , Intravenous, Continuous, Agbata, Tochukwu, MD, Stopped at 04/24/20 0422 .  ezetimibe (ZETIA) tablet 10 mg, 10 mg, Oral, Daily, Agbata, Tochukwu, MD, 10 mg at 04/23/20 1740 .  FLUoxetine (PROZAC) capsule 80 mg, 80 mg, Oral, Daily, Agbata, Tochukwu, MD, 80 mg at 04/23/20 1907 .  gabapentin (NEURONTIN) capsule 100 mg, 100 mg, Oral, BID, Agbata, Tochukwu, MD, 100 mg at 04/23/20 2223 .  insulin aspart (novoLOG) injection 0-6 Units, 0-6 Units, Subcutaneous, Q4H, Agbata, Tochukwu, MD, 2 Units at 04/24/20 0809 .  isosorbide mononitrate (IMDUR) 24 hr tablet 60 mg, 60 mg, Oral, Daily, Agbata, Tochukwu, MD, 60 mg at 04/23/20 1908 .  levETIRAcetam (KEPPRA) IVPB 500 mg/100 mL premix, 500 mg, Intravenous, Q12H, Amie Portland, MD, Stopped at 04/23/20 2257 .  metoprolol succinate (TOPROL-XL) 24 hr tablet 100 mg, 100 mg, Oral, Daily, Agbata, Tochukwu, MD, 100 mg at 04/23/20 1908 .  mirtazapine (REMERON) tablet 7.5 mg, 7.5 mg, Oral, QHS, Agbata, Tochukwu, MD, 7.5 mg at 04/23/20 2223 .  multivitamin with minerals tablet 1 tablet, 1 tablet, Oral, Daily, Agbata, Tochukwu, MD, 1 tablet at 04/23/20 1908 .  nitroGLYCERIN (NITROSTAT) SL tablet 0.4 mg, 0.4 mg, Sublingual, Q5 min PRN, Agbata, Tochukwu, MD .  ondansetron (ZOFRAN) tablet 4 mg, 4 mg, Oral, Q6H PRN **OR** ondansetron (  ZOFRAN) injection 4 mg, 4 mg, Intravenous, Q6H PRN, Agbata, Tochukwu, MD .  pantoprazole (PROTONIX) EC tablet 40 mg, 40 mg, Oral, Daily, Agbata, Tochukwu, MD, 40 mg at 04/23/20 1740 .  ranolazine (RANEXA) 12 hr tablet 500 mg, 500 mg, Oral, BID, Agbata, Tochukwu, MD, 500 mg at 04/23/20 2224 .  sevelamer carbonate (RENVELA) tablet 2,400 mg, 2,400 mg, Oral, TID WC, Agbata, Tochukwu, MD, 2,400 mg at 04/23/20 1908 .  ticagrelor  (BRILINTA) tablet 90 mg, 90 mg, Oral, BID, Agbata, Tochukwu, MD, 90 mg at 04/23/20 1912  Current Outpatient Medications:  .  acetaminophen (TYLENOL) 500 MG tablet, Take 500-1,000 mg by mouth every 6 (six) hours as needed for mild pain, moderate pain or fever., Disp: , Rfl:  .  albuterol (PROVENTIL HFA;VENTOLIN HFA) 108 (90 Base) MCG/ACT inhaler, Inhale 2 puffs into the lungs every 6 (six) hours as needed for wheezing or shortness of breath., Disp: 1 Inhaler, Rfl: 2 .  Alirocumab 150 MG/ML SOAJ, Inject 150 mg as directed every 14 (fourteen) days., Disp: , Rfl:  .  allopurinol (ZYLOPRIM) 100 MG tablet, Take 200 mg by mouth daily., Disp: , Rfl:  .  amiodarone (PACERONE) 200 MG tablet, Take 1 tablet (200 mg total) by mouth 2 (two) times daily., Disp: 60 tablet, Rfl: 1 .  apixaban (ELIQUIS) 5 MG TABS tablet, Take 5 mg by mouth 2 (two) times daily., Disp: , Rfl:  .  atorvastatin (LIPITOR) 80 MG tablet, Take 80 mg by mouth every evening. , Disp: , Rfl:  .  bumetanide (BUMEX) 2 MG tablet, Take 2 mg by mouth 2 (two) times daily., Disp: , Rfl:  .  cholecalciferol (VITAMIN D) 1000 units tablet, Take 1,000 Units by mouth daily., Disp: , Rfl:  .  ezetimibe (ZETIA) 10 MG tablet, Take 10 mg by mouth daily., Disp: , Rfl:  .  FLUoxetine (PROZAC) 40 MG capsule, Take 80 mg by mouth daily., Disp: , Rfl:  .  gabapentin (NEURONTIN) 300 MG capsule, Take 300 mg by mouth 2 (two) times a day., Disp: , Rfl:  .  insulin aspart (NOVOLOG) 100 UNIT/ML injection, Inject 20 Units into the skin 3 (three) times daily with meals. (Patient taking differently: Inject 15 Units into the skin 3 (three) times daily with meals.), Disp: , Rfl:  .  insulin detemir (LEVEMIR) 100 UNIT/ML injection, Inject 0.4 mLs (40 Units total) into the skin at bedtime. (Patient taking differently: Inject 30 Units into the skin at bedtime.), Disp: , Rfl:  .  isosorbide mononitrate (IMDUR) 30 MG 24 hr tablet, Take 1 tablet (30 mg total) by mouth daily. (Patient  taking differently: Take 60 mg by mouth daily. Do not take on dialysis days), Disp: 30 tablet, Rfl: 0 .  loperamide (IMODIUM) 2 MG capsule, Take 2 mg by mouth as needed for diarrhea or loose stools., Disp: , Rfl:  .  metoprolol succinate (TOPROL-XL) 100 MG 24 hr tablet, Take 100 mg by mouth daily., Disp: , Rfl:  .  mirtazapine (REMERON) 7.5 MG tablet, Take 7.5 mg by mouth at bedtime., Disp: , Rfl:  .  Multiple Vitamin (MULTIVITAMIN WITH MINERALS) TABS tablet, Take 1 tablet by mouth daily., Disp: 30 tablet, Rfl: 0 .  nitroGLYCERIN (NITROLINGUAL) 0.4 MG/SPRAY spray, Place 1 spray under the tongue every 5 (five) minutes x 3 doses as needed for chest pain., Disp: , Rfl:  .  omeprazole (PRILOSEC) 40 MG capsule, Take 40 mg by mouth 2 (two) times daily., Disp: , Rfl:  .  ondansetron (ZOFRAN) 4 MG tablet, Take 4 mg by mouth every 12 (twelve) hours as needed for nausea., Disp: , Rfl:  .  ranolazine (RANEXA) 500 MG 12 hr tablet, Take 500 mg by mouth 2 (two) times daily., Disp: , Rfl:  .  RENVELA 800 MG tablet, Take 2,400 mg by mouth 3 (three) times daily with meals., Disp: , Rfl:  .  Semaglutide,0.25 or 0.'5MG'$ /DOS, (OZEMPIC, 0.25 OR 0.5 MG/DOSE,) 2 MG/1.5ML SOPN, Inject 0.5 mg into the skin once a week., Disp: , Rfl:  .  ticagrelor (BRILINTA) 90 MG TABS tablet, Take 90 mg by mouth 2 (two) times daily., Disp: , Rfl:  .  VOLTAREN 1 % GEL, Apply 2 g topically 3 (three) times daily as needed (left leg pain). , Disp: , Rfl:  Labs CBC    Component Value Date/Time   WBC 5.0 04/24/2020 0429   RBC 3.45 (L) 04/24/2020 0429   HGB 9.9 (L) 04/24/2020 0429   HCT 30.7 (L) 04/24/2020 0429   PLT 149 (L) 04/24/2020 0429   MCV 89.0 04/24/2020 0429   MCH 28.7 04/24/2020 0429   MCHC 32.2 04/24/2020 0429   RDW 15.7 (H) 04/24/2020 0429   LYMPHSABS 0.9 04/12/2020 1332   MONOABS 0.5 04/12/2020 1332   EOSABS 0.2 04/12/2020 1332   BASOSABS 0.1 04/12/2020 1332    CMP     Component Value Date/Time   NA 135 04/24/2020  0429   K 4.7 04/24/2020 0429   CL 102 04/24/2020 0429   CO2 21 (L) 04/24/2020 0429   GLUCOSE 169 (H) 04/24/2020 0429   BUN 35 (H) 04/24/2020 0429   CREATININE 6.45 (H) 04/24/2020 0429   CALCIUM 7.6 (L) 04/24/2020 0429   PROT 6.7 04/23/2020 0752   ALBUMIN 2.6 (L) 04/23/2020 0752   AST 24 04/23/2020 0752   ALT 16 04/23/2020 0752   ALKPHOS 75 04/23/2020 0752   BILITOT 0.5 04/23/2020 0752   GFRNONAA 10 (L) 04/24/2020 0429   GFRAA 15 (L) 10/30/2018 0412   EEG normal  Imaging I have reviewed images in epic and the results pertinent to this consultation are: No new imaging to review CT from admission with no acute changes.  Assessment:  50 year old past history of diabetes, chronic systolic heart failure, CAD, hypertension, hyperlipidemia, paroxysmal A. fib on anticoagulation, ESRD on dialysis presenting for evaluation of seizure-like episodes. Describes having mild seizure-like activity for years and had a generalized tonic-clonic seizure along with periods of apnea and desaturation. Cardiology on board-and evaluating for syncope secondary to A. fib with RVR. His symptoms and description do not sound very consistent with seizures.  He was loaded with Keppra and started on Keppra during this hospitalization, which I will recommend to discontinue. Differentials include cardiogenic syncope versus dialysis etc. could be syndrome. There is also some question of excessive stressors which might be presenting themselves as nonepileptic events-psychogenic nonepileptic seizures.  In either case, I do not think he warrants AEDs. He does warrant seizure precautions as mandated by [place] law due to sudden loss of consciousness-documented below  Impression: Seizure-like activity-likely cardiogenic syncope or syncopal convulsion. Cannot exclude psychogenic nonepileptic seizures Also on the differential is a status post disequilibrium syndrome. History of sleep apnea-questionable compliance to CPAP.   Will benefit from repeat sleep study and CPAP compliance.  Recommendations: Discontinue antiepileptics Has had a normal EEG and brain imaging. Consider long-term EEG monitoring should he have more seizure-like activity.  If he has another witnessed generalized tonic-clonic seizure with postictal state and  bowel bladder incontinence-can consider reinitiating antiepileptics at the time. I discussed with him in detail, the fact that he probably does not have 2 seizures, but given the concern for seizures and the fact that he did lose consciousness, he should not drive for at least 6 months as mandated by the [place] law.  He verbalized understanding. Continue to maintain seizure precautions Appreciate cardiology work-up and recommendations. Outpatient neurology follow-up for sleep study.  Stressed the importance of compliance with CPAP. Inpatient neurology will be available as needed. Please call with questions.  Secure chat to Dr. Manuella Ghazi  -- Amie Portland, MD Neurologist [facility] Pager: [phone]

## 2020-04-24 NOTE — Hospital Course (Signed)
50 y.o. male with K/H/O for ESRD-HD, HTN, HLD, DM, PAF not on AC, CAD with stent, AICD, sCHF, gout, depression who presents from the dialysis center via EMS for evaluation of unresponsiveness with some concern for seizure  1/25: patient adamant in leaving AMA but after explaining the risks he has agreed to stay. Per wife he wants to leave due to his brother's funeral. Patient hypotensive and waiting to get HD today.

## 2020-04-24 NOTE — ED Notes (Signed)
Pt remains resting in bed NAD. Given water. Waiting on dialysis.

## 2020-04-25 DIAGNOSIS — N186 End stage renal disease: Secondary | ICD-10-CM

## 2020-04-25 DIAGNOSIS — G4733 Obstructive sleep apnea (adult) (pediatric): Secondary | ICD-10-CM

## 2020-04-25 DIAGNOSIS — I5022 Chronic systolic (congestive) heart failure: Secondary | ICD-10-CM

## 2020-04-25 LAB — CBG MONITORING, ED
Glucose-Capillary: 154 mg/dL — ABNORMAL HIGH (ref 70–99)
Glucose-Capillary: 115 mg/dL — ABNORMAL HIGH (ref 70–99)
Glucose-Capillary: 166 mg/dL — ABNORMAL HIGH (ref 70–99)

## 2020-04-25 LAB — CBC
HCT: 32.1 % — ABNORMAL LOW (ref 39.0–52.0)
Hemoglobin: 10.3 g/dL — ABNORMAL LOW (ref 13.0–17.0)
MCH: 28.3 pg (ref 26.0–34.0)
MCHC: 32.1 g/dL (ref 30.0–36.0)
MCV: 88.2 fL (ref 80.0–100.0)
Platelets: 171 10*3/uL (ref 150–400)
RBC: 3.64 MIL/uL — ABNORMAL LOW (ref 4.22–5.81)
RDW: 15.8 % — ABNORMAL HIGH (ref 11.5–15.5)
WBC: 5.5 10*3/uL (ref 4.0–10.5)
nRBC: 0 % (ref 0.0–0.2)

## 2020-04-25 LAB — BASIC METABOLIC PANEL
Anion gap: 12 (ref 5–15)
BUN: 25 mg/dL — ABNORMAL HIGH (ref 6–20)
CO2: 23 mmol/L (ref 22–32)
Calcium: 7.4 mg/dL — ABNORMAL LOW (ref 8.9–10.3)
Chloride: 101 mmol/L (ref 98–111)
Creatinine, Ser: 5.3 mg/dL — ABNORMAL HIGH (ref 0.61–1.24)
GFR, Estimated: 12 mL/min — ABNORMAL LOW (ref >60)
Glucose, Bld: 130 mg/dL — ABNORMAL HIGH (ref 70–99)
Potassium: 4.1 mmol/L (ref 3.5–5.1)
Sodium: 136 mmol/L (ref 135–145)

## 2020-04-25 LAB — TROPONIN I (HIGH SENSITIVITY): Troponin I (High Sensitivity): 46 ng/L — ABNORMAL HIGH (ref <18)

## 2020-04-25 LAB — HEPATITIS B SURFACE ANTIGEN: Hepatitis B Surface Ag: NONREACTIVE

## 2020-04-25 MED ORDER — GABAPENTIN 100 MG CAPSULE
ORAL_CAPSULE | 0 refills | 0.00000 days
Start: 2020-04-25 — End: 2020-05-08

## 2020-04-25 MED ORDER — MIRTAZAPINE 7.5 MG TABLET
ORAL_TABLET | Freq: Every evening | ORAL | 0 refills | 30 days
Start: 2020-04-25 — End: 2020-05-25

## 2020-04-25 MED ORDER — ISOSORBIDE MONONITRATE ER 30 MG PO TB24: 60.0000 mg | ORAL_TABLET | Freq: Every day | ORAL | Status: AC

## 2020-04-25 MED ORDER — GABAPENTIN 100 MG PO CAPS: 100.0000 mg | ORAL_CAPSULE | Freq: Two times a day (BID) | ORAL | 0 refills | Status: AC

## 2020-04-25 MED ORDER — INSULIN DETEMIR 100 UNIT/ML ~~LOC~~ SOLN: 10.0000 [IU] | Freq: Every day | SUBCUTANEOUS | Status: AC

## 2020-04-25 NOTE — Discharge Summary (Addendum)
Physician Discharge Summary  Edwin [name] [id number] DOB: [birth year] DOA: 04/23/2020  PCP: Caryl Never, MD  Admit date: 04/23/2020 Discharge date: 04/25/2020  Admitted From: Home Disposition: Home  Recommendations for Outpatient Follow-up:  1. Follow up with PCP in 1 week with repeat CBC/BMP 2. Outpatient follow-up with hemodialysis as scheduled 3. Outpatient follow-up with cardiology 4. Outpatient follow-up with neurology if needed.  Neurology recommends that patient should not be driving for at least 6 months and should be cleared by PCP prior to driving again. 5. Follow up in ED if symptoms worsen or new appear   Home Health: No Equipment/Devices: None  Discharge Condition: Stable CODE STATUS: Full Diet recommendation: Heart healthy/carb modified/renal hemodialysis diet  Brief/Interim Summary: 50 year old male with history of end-stage renal disease on hemodialysis, hypertension, hyperlipidemia, diabetes mellitus type 2, PAF, CAD with stent, chronic systolic heart failure, status post AICD, gout, depression presented from dialysis unit via EMS for evaluation of unresponsiveness with concern for seizure.  During the hospitalization, patient was evaluated by neurology who recommended no need for antiepileptics.  Normal EEG and brain imaging.  Neurology signed off.  Cardiology and nephrology also followed the patient during the hospitalization.  He was found to be Covid positive as well but currently his respiratory status is stable and not requiring any treatment for Covid.  he will be discharged home once arrangements for outpatient hemodialysis is arranged.  Discharge Diagnoses:   Unresponsiveness/seizure-like activity/cardiac syncope -Per neuro this is not seizure and don't need antiepileptics. Normal EEG and brain imaging. Pseudoseizures is in differential He is back to his baseline mental status. Per [place] law - he shouldn't drive for at least 6  months  Cardiogenic syncope -No more syncope since admission.  Cardiology following.  Outpatient follow-up with cardiology.  Continue amiodarone, metoprolol and Eliquis  CAD with stent/chronic systolic heart failure with EF of 15 to 20%/status post AICD -Recent cath at [facility] (12/21) s/p successful PCI and DES -Continue brilanta, Toprol, Imdur, Bumex, Lipitor & Ranexa -Outpatient follow-up with cardiology  End-stage renal disease on hemodialysis Anemia of chronic disease: From renal failure -Nephrology following.  Had dialysis yesterday.  Outpatient hemodialysis is being arranged.  Once this is arranged, patient can be discharged home -Hemoglobin stable.  Outpatient follow-up.  COVID-19 infection -Currently no respiratory symptoms and on room air.  No need for any treatment.  Continue isolation upon discharge to finish quarantine course.  Essential hypertension -Continue current antihypertensive regimen  Depression -Continue Prozac and Remeron  OSA -Outpatient follow-up.  Morbid obesity -Outpatient follow-up  Discharge Instructions  Discharge Instructions    Ambulatory referral to Cardiology   Complete by: As directed    Diet - low sodium heart healthy   Complete by: As directed    Diet Carb Modified   Complete by: As directed    Increase activity slowly   Complete by: As directed    No wound care   Complete by: As directed      Allergies as of 04/25/2020      Reactions   Losartan    Hyperkalemia (K>6)   Morphine Anxiety   Morphine And Related Other (See Comments)   Overdose in hospital per patient report      Medication List    STOP taking these medications   insulin aspart 100 UNIT/ML injection Commonly known as: novoLOG     TAKE these medications   acetaminophen 500 MG tablet Commonly known as: TYLENOL Take 500-1,000 mg by mouth every 6 (  six) hours as needed for mild pain, moderate pain or fever.   albuterol 108 (90 Base) MCG/ACT inhaler Commonly  known as: VENTOLIN HFA Inhale 2 puffs into the lungs every 6 (six) hours as needed for wheezing or shortness of breath.   Alirocumab 150 MG/ML Soaj Inject 150 mg as directed every 14 (fourteen) days.   allopurinol 100 MG tablet Commonly known as: ZYLOPRIM Take 200 mg by mouth daily.   amiodarone 200 MG tablet Commonly known as: PACERONE Take 1 tablet (200 mg total) by mouth 2 (two) times daily.   apixaban 5 MG Tabs tablet Commonly known as: ELIQUIS Take 5 mg by mouth 2 (two) times daily.   atorvastatin 80 MG tablet Commonly known as: LIPITOR Take 80 mg by mouth every evening.   bumetanide 2 MG tablet Commonly known as: BUMEX Take 2 mg by mouth 2 (two) times daily.   cholecalciferol 25 MCG (1000 UNIT) tablet Commonly known as: VITAMIN D Take 1,000 Units by mouth daily.   ezetimibe 10 MG tablet Commonly known as: ZETIA Take 10 mg by mouth daily.   FLUoxetine 40 MG capsule Commonly known as: PROZAC Take 80 mg by mouth daily.   gabapentin 100 MG capsule Commonly known as: NEURONTIN Take 1 capsule (100 mg total) by mouth 2 (two) times daily. What changed:   medication strength  how much to take  when to take this   insulin detemir 100 UNIT/ML injection Commonly known as: LEVEMIR Inject 0.1 mLs (10 Units total) into the skin at bedtime. What changed: how much to take   isosorbide mononitrate 30 MG 24 hr tablet Commonly known as: IMDUR Take 2 tablets (60 mg total) by mouth daily. Do not take on dialysis days   loperamide 2 MG capsule Commonly known as: IMODIUM Take 2 mg by mouth as needed for diarrhea or loose stools.   metoprolol succinate 100 MG 24 hr tablet Commonly known as: TOPROL-XL Take 100 mg by mouth daily.   mirtazapine 7.5 MG tablet Commonly known as: REMERON Take 7.5 mg by mouth at bedtime.   multivitamin with minerals Tabs tablet Take 1 tablet by mouth daily.   nitroGLYCERIN 0.4 MG/SPRAY spray Commonly known as: NITROLINGUAL Place 1  spray under the tongue every 5 (five) minutes x 3 doses as needed for chest pain.   omeprazole 40 MG capsule Commonly known as: PRILOSEC Take 40 mg by mouth 2 (two) times daily.   ondansetron 4 MG tablet Commonly known as: ZOFRAN Take 4 mg by mouth every 12 (twelve) hours as needed for nausea.   Ozempic (0.25 or 0.5 MG/DOSE) 2 MG/1.5ML Sopn Generic drug: Semaglutide(0.25 or 0.'5MG'$ /DOS) Inject 0.5 mg into the skin once a week.   ranolazine 500 MG 12 hr tablet Commonly known as: RANEXA Take 500 mg by mouth 2 (two) times daily.   Renvela 800 MG tablet Generic drug: sevelamer carbonate Take 2,400 mg by mouth 3 (three) times daily with meals.   ticagrelor 90 MG Tabs tablet Commonly known as: BRILINTA Take 90 mg by mouth 2 (two) times daily.   Voltaren 1 % Gel Generic drug: diclofenac Sodium Apply 2 g topically 3 (three) times daily as needed (left leg pain).        Follow-up Information    Gladman, Okey Dupre, MD. Schedule an appointment as soon as possible for a visit in 1 week(s).   Specialty: Internal Medicine Contact information: [address] [place] [address] [phone]  Allergies  Allergen Reactions  . Losartan     Hyperkalemia (K>6)  . Morphine Anxiety  . Morphine And Related Other (See Comments)    Overdose in hospital per patient report    Consultations:  Neurology/nephrology/cardiology   Procedures/Studies: EEG  Result Date: 04/23/2020 Lora Havens, MD     04/23/2020  5:47 PM [name] MRN: [id number] Epilepsy Attending: Lora Havens Referring Physician/Provider: Dr Amie [place] Date: 04/23/2020 Duration: 21.32 mins Patient history: 50yo M with seizure like activity. EEG to evaluate for seizure. Level of alertness: Awake AEDs during EEG study: GBP, LEV Technical aspects: This EEG study was done with scalp electrodes positioned according to the 10-20 International system of electrode  placement. Electrical activity was acquired at a sampling rate of '500Hz'$  and reviewed with a high frequency filter of '70Hz'$  and a low frequency filter of '1Hz'$ . EEG data were recorded continuously and digitally stored. Description: The posterior dominant rhythm consists of 8 Hz activity of moderate voltage (25-35 uV) seen predominantly in posterior head regions, symmetric and reactive to eye opening and eye closing. Hyperventilation and photic stimulation were not performed.   IMPRESSION: This study is within normal limits. No seizures or epileptiform discharges were seen throughout the recording. Lora Havens   CT Head Wo Contrast  Result Date: 04/23/2020 CLINICAL DATA:  Altered mental status.  Hypertension. EXAM: CT HEAD WITHOUT CONTRAST TECHNIQUE: Contiguous axial images were obtained from the base of the skull through the vertex without intravenous contrast. COMPARISON:  April 12, 2020. FINDINGS: Brain: Ventricles and sulci are normal in size and configuration. There is no intracranial mass, hemorrhage, extra-axial fluid collection, midline shift. Brain parenchyma appears unremarkable. No acute infarct evident. Vascular: No hyperdense vessel. There is mild calcification in the right carotid siphon region. Skull: The bony calvarium appears intact. Sinuses/Orbits: Paranasal sinuses are clear. There is a degree of exophthalmos bilaterally. Orbits appear symmetric bilaterally. Other: Mastoid air cells are clear. IMPRESSION: 1.  Normal appearing brain parenchyma.  No mass or hemorrhage. 2. There is a degree of exophthalmos, symmetric. Correlation with thyroid function advised in this regard. 3.  Slight calcification in the right carotid siphon region. Electronically Signed   By: Lowella Grip III M.D.   On: 04/23/2020 09:53   CT Head Wo Contrast  Result Date: 04/12/2020 CLINICAL DATA:  50 year old male with a history mental status changes EXAM: CT HEAD WITHOUT CONTRAST TECHNIQUE: Contiguous axial images  were obtained from the base of the skull through the vertex without intravenous contrast. COMPARISON:  07/08/2018 FINDINGS: Brain: No acute intracranial hemorrhage. No midline shift or mass effect. Gray-white differentiation maintained. Unremarkable appearance of the ventricular system. Vascular: Unremarkable. Skull: No acute fracture.  No aggressive bone lesion identified. 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 Unremarkable paranasal sinuses which are clear. No mastoid effusion. Other: None IMPRESSION: Negative for acute intracranial abnormality. Interval development of bilateral exophthalmos, which can be seen with thyroid disease. Correlation with thyroid labs recommended. Electronically Signed   By: Corrie Mckusick D.O.   On: 04/12/2020 14:55   DG Chest Portable 1 View  Result Date: 04/23/2020 CLINICAL DATA:  Altered mental status Hypertensive during dialysis Unresponsive EXAM: PORTABLE CHEST 1 VIEW COMPARISON:  04/12/2020 FINDINGS: Unchanged cardiomegaly. Scattered strandy lung opacities likely due to atelectasis. Dual lead left chest wall AICD unchanged in configuration. Dialysis catheter unchanged in position. IMPRESSION: Unchanged mild cardiomegaly. Electronically Signed   By:  Sharen Heck  Mir M.D.   On: 04/23/2020 08:12   DG Chest Portable 1 View  Result Date: 04/12/2020 CLINICAL DATA:  Seizure activity, vomiting, history pacemaker, coronary artery disease, chronic systolic heart failure, diabetes mellitus, hypertension, ischemic cardiomyopathy, paroxysmal atrial fibrillation EXAM: PORTABLE CHEST 1 VIEW COMPARISON:  Portable exam 1345 hours compared to 01/23/2020 FINDINGS: RIGHT jugular dual-lumen central venous catheter with tips projecting over RIGHT atrium. LEFT subclavian AICD with leads projecting over RIGHT atrium and RIGHT ventricle. Enlargement of cardiac silhouette with vascular congestion. Stable  mediastinal contours. Decreased lung volumes without gross infiltrate, pleural effusion or pneumothorax. Osseous structures unremarkable. IMPRESSION: Enlargement of cardiac silhouette with pulmonary vascular congestion post ICD. No acute abnormalities. Electronically Signed   By: Lavonia Dana M.D.   On: 04/12/2020 13:57       Subjective: Patient seen and examined at bedside.  Denies worsening shortness of breath, cough, fever.  Answering questions appropriately.  Wants to go home today.  Discharge Exam: Vitals:   04/25/20 0600 04/25/20 0800  BP: 140/88 136/87  Pulse: 69 72  Resp: 16 18  Temp:    SpO2: 98% 95%    General: Pt is alert, awake, not in acute distress.  Looks chronically ill. Cardiovascular: rate controlled, S1/S2 + Respiratory: bilateral decreased breath sounds at bases with scattered crackles Abdominal: Soft, morbidly obese, NT, ND, bowel sounds + Extremities: Trace lower extremity edema; no cyanosis    The results of significant diagnostics from this hospitalization (including imaging, microbiology, ancillary and laboratory) are listed below for reference.     Microbiology: Recent Results (from the past 240 hour(s))  SARS Coronavirus 2 by RT PCR (hospital order, performed in [facility] hospital lab) Nasopharyngeal Nasopharyngeal Swab     Status: Abnormal   Collection Time: 04/23/20  8:15 AM   Specimen: Nasopharyngeal Swab  Result Value Ref Range Status   SARS Coronavirus 2 POSITIVE (A) NEGATIVE Final    Comment: RESULT CALLED TO, READ BACK BY AND VERIFIED WITH: SAMANTHA HAMILTON ON 04/23/20 AT 1052 QSD (NOTE) SARS-CoV-2 target nucleic acids are DETECTED  SARS-CoV-2 RNA is generally detectable in upper respiratory specimens  during the acute phase of infection.  Positive results are indicative  of the presence of the identified virus, but do not rule out bacterial infection or co-infection with other pathogens not detected by the test.  Clinical correlation  with patient history and  other diagnostic information is necessary to determine patient infection status.  The expected result is negative.  Fact Sheet for Patients:   StrictlyIdeas.no   Fact Sheet for Healthcare Providers:   BankingDealers.co.za    This test is not yet approved or cleared by the [place] FDA and  has been authorized for detection and/or diagnosis of SARS-CoV-2 by FDA under an Emergency Use Authorization (EUA).  This EUA will remain in effect (meaning t his test can be used) for the duration of  the COVID-19 declaration under Section 564(b)(1) of the Act, 21 U.S.C. section 360-bbb-3(b)(1), unless the authorization is terminated or revoked sooner.  Performed at [facility], [address] [place], [place] [address]      Labs: BNP (last 3 results) No results for input(s): BNP in the last 8760 hours. Basic Metabolic Panel: Recent Labs  Lab 04/23/20 0752 04/24/20 0429 04/25/20 0458  NA 137 135 136  K 4.3 4.7 4.1  CL 102 102 101  CO2 23 21* 23  GLUCOSE 216* 169* 130*  BUN 27* 35* 25*  CREATININE 5.68* 6.45*  5.30*  CALCIUM 8.3* 7.6* 7.4*   Liver Function Tests: Recent Labs  Lab 04/23/20 0752  AST 24  ALT 16  ALKPHOS 75  BILITOT 0.5  PROT 6.7  ALBUMIN 2.6*   No results for input(s): LIPASE, AMYLASE in the last 168 hours. No results for input(s): AMMONIA in the last 168 hours. CBC: Recent Labs  Lab 04/23/20 0752 04/24/20 0429 04/25/20 0458  WBC 3.8* 5.0 5.5  HGB 10.6* 9.9* 10.3*  HCT 34.1* 30.7* 32.1*  MCV 89.0 89.0 88.2  PLT 112* 149* 171   Cardiac Enzymes: No results for input(s): CKTOTAL, CKMB, CKMBINDEX, TROPONINI in the last 168 hours. BNP: Invalid input(s): POCBNP CBG: Recent Labs  Lab 04/24/20 1151 04/24/20 1904 04/24/20 2345 04/25/20 0308 04/25/20 0755  GLUCAP 157* 99 201* 154* 115*   D-Dimer No results for input(s): DDIMER in the last 72 hours. Hgb  A1c No results for input(s): HGBA1C in the last 72 hours. Lipid Profile No results for input(s): CHOL, HDL, LDLCALC, TRIG, CHOLHDL, LDLDIRECT in the last 72 hours. Thyroid function studies No results for input(s): TSH, T4TOTAL, T3FREE, THYROIDAB in the last 72 hours.  Invalid input(s): FREET3 Anemia work up No results for input(s): VITAMINB12, FOLATE, FERRITIN, TIBC, IRON, RETICCTPCT in the last 72 hours. Urinalysis    Component Value Date/Time   COLORURINE STRAW (A) 10/28/2018 2145   APPEARANCEUR CLEAR (A) 10/28/2018 2145   LABSPEC 1.008 10/28/2018 2145   PHURINE 5.0 10/28/2018 2145   GLUCOSEU 50 (A) 10/28/2018 2145   HGBUR SMALL (A) 10/28/2018 2145   BILIRUBINUR NEGATIVE 10/28/2018 2145   KETONESUR NEGATIVE 10/28/2018 2145   PROTEINUR 100 (A) 10/28/2018 2145   NITRITE NEGATIVE 10/28/2018 2145   LEUKOCYTESUR NEGATIVE 10/28/2018 2145   Sepsis Labs Invalid input(s): PROCALCITONIN,  WBC,  LACTICIDVEN Microbiology Recent Results (from the past 240 hour(s))  SARS Coronavirus 2 by RT PCR (hospital order, performed in [place] hospital lab) Nasopharyngeal Nasopharyngeal Swab     Status: Abnormal   Collection Time: 04/23/20  8:15 AM   Specimen: Nasopharyngeal Swab  Result Value Ref Range Status   SARS Coronavirus 2 POSITIVE (A) NEGATIVE Final    Comment: RESULT CALLED TO, READ BACK BY AND VERIFIED WITH: SAMANTHA HAMILTON ON 04/23/20 AT 1052 QSD (NOTE) SARS-CoV-2 target nucleic acids are DETECTED  SARS-CoV-2 RNA is generally detectable in upper respiratory specimens  during the acute phase of infection.  Positive results are indicative  of the presence of the identified virus, but do not rule out bacterial infection or co-infection with other pathogens not detected by the test.  Clinical correlation with patient history and  other diagnostic information is necessary to determine patient infection status.  The expected result is negative.  Fact Sheet for Patients:    StrictlyIdeas.no   Fact Sheet for Healthcare Providers:   BankingDealers.co.za    This test is not yet approved or cleared by the [place] FDA and  has been authorized for detection and/or diagnosis of SARS-CoV-2 by FDA under an Emergency Use Authorization (EUA).  This EUA will remain in effect (meaning t his test can be used) for the duration of  the COVID-19 declaration under Section 564(b)(1) of the Act, 21 U.S.C. section 360-bbb-3(b)(1), unless the authorization is terminated or revoked sooner.  Performed at [facility], [address] [place], [place] [address]      Time coordinating discharge: 35 minutes  SIGNED:   Aline August, MD  [facility] 04/25/2020, 10:39 AM

## 2020-04-25 NOTE — Progress Notes (Signed)
[name] Date of Encounter: 04/25/2020  Hospital Problem List     Principal Problem:   Recurrent episodes of unresponsiveness Active Problems:   ESRD (end stage renal disease) ([place])   CAD (coronary artery disease)   Type 2 diabetes mellitus with ESRD (end-stage renal disease) ([name])   Obesity, Class III, BMI 40-49.9 (morbid obesity) ([name])   Chronic systolic CHF (congestive heart failure) ([name])   Depression   Dilated cardiomyopathy ([name])   OSA (obstructive sleep apnea)    Patient Profile       50 y.o.malewith history ofCoronary artery disease involving native coronary artery of native heart without angina pectoris,Chronic heart failure with reduced ejection fraction and diastolic dysfunction Paroxysmal atrial fibrillation . Essential hypertensiona andCKD 5. [facility]. H underwent cardiac cathin 12/21which revealed80% ostial D1 stenosis as well as 90%  ISR of the mid-LAD at the bifurcation of D1 now s/p successful PCI to D1  with a 2.25x15 Xience Skypoint DES. . 90% in-stent restenosis of the distal RCA also appreciated and new compared to 07/2019 study. IVUS was performed and confirmed ISR with well-expanded stents. Now s/p PTCA of the distal RCA/rPDA with a 4.0x12 [place] balloon to high pressure. Elevated left ventricular filling pressures (LVEDP = 30 mm Hg). He has a boaston aicd pacemaker, sleep apnea, He was seen 2 weeks ago with syncope and was noted to have episodes of afib with rvr at the time of his events. AICD did not fire. He was brought to the er this admission after noted to be hypotensive during HD and then becam unresponsive. He was felt to be in VT by EMS. He underwent CPR and was given a bolus of iv amiodarone. He was noted to be iintermitantly unresponsive in the er with response to sternal rubs. Electrolytes are normal. hsTrop was unremarkable. COVID positive. Head CT unremarkable. Episodes of unresponsiveness  in the er are associated with hypoxia.Patient states that he has been doing reasonably well although is mourning the death of a brother from cancer. He was first on the scene when he had sudden death. He reports that he is compliant with his medications. Been having episodes of unresponsiveness. As mentioned above, was unresponsive after a period of hypotension during hemodialysis. Was felt to be in V. tach and was given amiodarone. He has had further episodes in the ER although rhythm appears to be sinus rhythm with intermittent ventricular paced beats. There are some nonsustained runs on telemetry. Interrogation of his device revealed nonsustained brief runs of vt. Intermittent nonsustained afib. Currently in nsr or ap\ vs  Subjective   Feels better. Anxious to be discharged.  Inpatient Medications    . allopurinol  200 mg Oral Daily  . amiodarone  200 mg Oral BID  . apixaban  5 mg Oral BID  . atorvastatin  80 mg Oral QPM  . bumetanide  2 mg Oral BID  . Chlorhexidine Gluconate Cloth  6 each Topical Q0600  . cholecalciferol  1,000 Units Oral Daily  . epoetin (EPOGEN/PROCRIT) injection  10,000 Units Intravenous Q T,Th,Sa-HD  . ezetimibe  10 mg Oral Daily  . FLUoxetine  80 mg Oral Daily  . gabapentin  100 mg Oral BID  . insulin aspart  0-6 Units Subcutaneous Q4H  . isosorbide mononitrate  60 mg Oral Daily  . metoprolol succinate  100 mg Oral Daily  . mirtazapine  7.5 mg Oral QHS  . multivitamin with minerals  1  tablet Oral Daily  . pantoprazole  40 mg Oral Daily  . ranolazine  500 mg Oral BID  . sevelamer carbonate  2,400 mg Oral TID WC  . ticagrelor  90 mg Oral BID    Vital Signs    Vitals:   04/25/20 1030 04/25/20 1100 04/25/20 1149 04/25/20 1200  BP:    (!) 102/55  Pulse: 68 82 70 70  Resp: 12 (!) '22 20 11  '$ Temp:      TempSrc:      SpO2: 100% 96% 95% 95%  Weight:      Height:        Intake/Output Summary (Last 24 hours) at 04/25/2020 1336 Last data filed at  04/24/2020 1830 Gross per 24 hour  Intake -  Output 2000 ml  Net -2000 ml   Filed Weights   04/23/20 0753  Weight: (!) 165 kg    Physical Exam    GEN: Well nourished, well developed, in no acute distress.  HEENT: normal.  Neck: Supple, no JVD, carotid bruits, or masses. Cardiac: RRR, no murmurs, rubs, or gallops. No clubbing, cyanosis, edema.  Radials/DP/PT 2+ and equal bilaterally.  Respiratory:  Respirations regular and unlabored, clear to auscultation bilaterally. GI: Soft, nontender, nondistended, BS + x 4. MS: no deformity or atrophy. Skin: warm and dry, no rash. Neuro:  Strength and sensation are intact. Psych: Normal affect.  Labs    CBC Recent Labs    04/24/20 0429 04/25/20 0458  WBC 5.0 5.5  HGB 9.9* 10.3*  HCT 30.7* 32.1*  MCV 89.0 88.2  PLT 149* [id number]   Basic Metabolic Panel Recent Labs    04/24/20 0429 04/25/20 0458  NA 135 136  K 4.7 4.1  CL 102 101  CO2 21* 23  GLUCOSE 169* 130*  BUN 35* 25*  CREATININE 6.45* 5.30*  CALCIUM 7.6* 7.4*   Liver Function Tests Recent Labs    04/23/20 0752  AST 24  ALT 16  ALKPHOS 75  BILITOT 0.5  PROT 6.7  ALBUMIN 2.6*   No results for input(s): LIPASE, AMYLASE in the last 72 hours. Cardiac Enzymes No results for input(s): CKTOTAL, CKMB, CKMBINDEX, TROPONINI in the last 72 hours. BNP No results for input(s): BNP in the last 72 hours. D-Dimer No results for input(s): DDIMER in the last 72 hours. Hemoglobin A1C No results for input(s): HGBA1C in the last 72 hours. Fasting Lipid Panel No results for input(s): CHOL, HDL, LDLCALC, TRIG, CHOLHDL, LDLDIRECT in the last 72 hours. Thyroid Function Tests No results for input(s): TSH, T4TOTAL, T3FREE, THYROIDAB in the last 72 hours.  Invalid input(s): FREET3  Telemetry    nsr  ECG    nsr   Radiology    EEG  Result Date: 04/23/2020 Edwin Havens, MD     04/23/2020  5:47 PM [name] MRN: [id number] Epilepsy Attending: Lora [name] Referring Physician/Provider: Dr Amie Portland Date: 04/23/2020 Duration: 21.32 mins Patient history: 51yo M with seizure like activity. EEG to evaluate for seizure. Level of alertness: Awake AEDs during EEG study: GBP, LEV Technical aspects: This EEG study was done with scalp electrodes positioned according to the 10-20 International system of electrode placement. Electrical activity was acquired at a sampling rate of '500Hz'$  and reviewed with a high frequency filter of '70Hz'$  and a low frequency filter of '1Hz'$ . EEG data were recorded continuously and digitally stored. Description: The posterior dominant rhythm consists of 8 Hz activity of moderate voltage (25-35 uV) seen  predominantly in posterior head regions, symmetric and reactive to eye opening and eye closing. Hyperventilation and photic stimulation were not performed.   IMPRESSION: This study is within normal limits. No seizures or epileptiform discharges were seen throughout the recording. Edwin [name]   CT Head Wo Contrast  Result Date: 04/23/2020 CLINICAL DATA:  Altered mental status.  Hypertension. EXAM: CT HEAD WITHOUT CONTRAST TECHNIQUE: Contiguous axial images were obtained from the base of the skull through the vertex without intravenous contrast. COMPARISON:  April 12, 2020. FINDINGS: Brain: Ventricles and sulci are normal in size and configuration. There is no intracranial mass, hemorrhage, extra-axial fluid collection, midline shift. Brain parenchyma appears unremarkable. No acute infarct evident. Vascular: No hyperdense vessel. There is mild calcification in the right carotid siphon region. Skull: The bony calvarium appears intact. Sinuses/Orbits: Paranasal sinuses are clear. There is a degree of exophthalmos bilaterally. Orbits appear symmetric bilaterally. Other: Mastoid air cells are clear. IMPRESSION: 1.  Normal appearing brain parenchyma.  No mass or hemorrhage. 2. There is a degree of exophthalmos, symmetric. Correlation with  thyroid function advised in this regard. 3.  Slight calcification in the right carotid siphon region. Electronically Signed   By: Lowella Grip III M.D.   On: 04/23/2020 09:53   CT Head Wo Contrast  Result Date: 04/12/2020 CLINICAL DATA:  50 year old male with a history mental status changes EXAM: CT HEAD WITHOUT CONTRAST TECHNIQUE: Contiguous axial images were obtained from the base of the skull through the vertex without intravenous contrast. COMPARISON:  07/08/2018 FINDINGS: Brain: No acute intracranial hemorrhage. No midline shift or mass effect. Gray-white differentiation maintained. Unremarkable appearance of the ventricular system. Vascular: Unremarkable. Skull: No acute fracture.  No aggressive bone lesion identified. 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 Unremarkable paranasal sinuses which are clear. No mastoid effusion. Other: None IMPRESSION: Negative for acute intracranial abnormality. Interval development of bilateral exophthalmos, which can be seen with thyroid disease. Correlation with thyroid labs recommended. Electronically Signed   By: Corrie Mckusick D.O.   On: 04/12/2020 14:55   DG Chest Portable 1 View  Result Date: 04/23/2020 CLINICAL DATA:  Altered mental status Hypertensive during dialysis Unresponsive EXAM: PORTABLE CHEST 1 VIEW COMPARISON:  04/12/2020 FINDINGS: Unchanged cardiomegaly. Scattered strandy lung opacities likely due to atelectasis. Dual lead left chest wall AICD unchanged in configuration. Dialysis catheter unchanged in position. IMPRESSION: Unchanged mild cardiomegaly. Electronically Signed   By: Miachel Roux M.D.   On: 04/23/2020 08:12   DG Chest Portable 1 View  Result Date: 04/12/2020 CLINICAL DATA:  Seizure activity, vomiting, history pacemaker, coronary artery disease, chronic systolic heart failure, diabetes mellitus, hypertension, ischemic cardiomyopathy,  paroxysmal atrial fibrillation EXAM: PORTABLE CHEST 1 VIEW COMPARISON:  Portable exam 1345 hours compared to 01/23/2020 FINDINGS: RIGHT jugular dual-lumen central venous catheter with tips projecting over RIGHT atrium. LEFT subclavian AICD with leads projecting over RIGHT atrium and RIGHT ventricle. Enlargement of cardiac silhouette with vascular congestion. Stable mediastinal contours. Decreased lung volumes without gross infiltrate, pleural effusion or pneumothorax. Osseous structures unremarkable. IMPRESSION: Enlargement of cardiac silhouette with pulmonary vascular congestion post ICD. No acute abnormalities. Electronically Signed   By: Lavonia Dana M.D.   On: 04/12/2020 13:57    Assessment & Plan      1. Syncope-appears to be secondary to A. fib with RVR based on interrogation of AICD. He is on amiodaroneat 200 mg twice dailyalong with metoprolol succinate 100 mg daily and anticoagulated  with apixaban. . Have discussed this with his primary cardiologist who agrees with this attempt. Attempting to maintain sinus rhythm and avoiding A. fib is at goal.He has also been compliant with and continues withamlodipine 10 mg daily, atorvastatin 10 mg daily, bumetanide 2 mg twice daily, Zetia 10 mg daily, isosorbide mononitrate 30 mg daily, metoprolol succinate 100 mg daily and Ranexa 500 mg twice daily.Etiology of the events are unclear. He is being evaluated by neurology. Brain CT was unremarkable. He was given Keppra. No obvious seizure activity. Has an EEG pending. Has history of pseudoseizures Has frequent apneic episodes. No sustained afib or vt noted on telemetry or on interogation. Will continue to follow on amiodarone and metoprolol  2. End-stage renal expertise-is on Monday Wednesday Friday dialysis.Agree with consultation per nephrology to evaluate timing of further dialysis.  3. Sleep apnea-continue with BiPAP .  4. CAD-fibrillation . Essential hypertensiona andCKD 5.  [facility]. H underwent cardiac cathin 12/21which revealed80% ostial D1 stenosis as well as 90% ISR of the mid-LAD at the bifurcation of D1 now s/p successful PCI to D1  with a 2.25x15 Xience Skypoint DES. . 90% in-stent restenosis of the distal RCA also appreciated and new compared to 07/2019 study. IVUS was performed and confirmed ISR with well-expanded stents. Now s/p PTCA of the distal RCA/rPDA with a 4.0x12 [place] balloon to high pressure.  Stay on brinlinta 90 bid.   Okay for discharge from a cardiac standpoint on current regimen. Follow-up with Dr. Howard Pouch at [facility]. Signed, Javier Docker Tedd Cottrill MD 04/25/2020, 1:36 PM  Pager: [phone]

## 2020-04-25 NOTE — Progress Notes (Signed)
[facility]  ROUNDING NOTE   Subjective:   Hemodialysis treatment yesterday. Tolerating treatment well. UF 2 liters.    Objective:  Vital signs in last 24 hours:  Temp:  [98.2 F (36.8 C)] 98.2 F (36.8 C) (01/25 1555) Pulse Rate:  [67-126] 70 (01/26 1200) Resp:  [9-28] 11 (01/26 1200) BP: (102-147)/(55-95) 102/55 (01/26 1200) SpO2:  [91 %-100 %] 95 % (01/26 1200)  Weight change:  Filed Weights   04/23/20 0753  Weight: (!) 165 kg    Intake/Output: I/O last 3 completed shifts: In: 348.6 [I.V.:348.6] Out: 2000 [Other:2000]   Intake/Output this shift:  No intake/output data recorded.  Physical Exam: General: NAD, laying in bed  Head: Normocephalic, atraumatic. Moist oral mucosal membranes  Eyes: Anicteric, PERRL  Neck: Supple, trachea midline  Lungs:  Clear to auscultation, room air  Heart: Regular rate and rhythm  Abdomen:  Soft, nontender, obese  Extremities: + peripheral edema.  Neurologic: nonfocal.   Skin: No lesions  Access: RIJ permcath    Basic Metabolic Panel: Recent Labs  Lab 04/23/20 0752 04/24/20 0429 04/25/20 0458  NA 137 135 136  K 4.3 4.7 4.1  CL 102 102 101  CO2 23 21* 23  GLUCOSE 216* 169* 130*  BUN 27* 35* 25*  CREATININE 5.68* 6.45* 5.30*  CALCIUM 8.3* 7.6* 7.4*    Liver Function Tests: Recent Labs  Lab 04/23/20 0752  AST 24  ALT 16  ALKPHOS 75  BILITOT 0.5  PROT 6.7  ALBUMIN 2.6*   No results for input(s): LIPASE, AMYLASE in the last 168 hours. No results for input(s): AMMONIA in the last 168 hours.  CBC: Recent Labs  Lab 04/23/20 0752 04/24/20 0429 04/25/20 0458  WBC 3.8* 5.0 5.5  HGB 10.6* 9.9* 10.3*  HCT 34.1* 30.7* 32.1*  MCV 89.0 89.0 88.2  PLT 112* 149* 171    Cardiac Enzymes: No results for input(s): CKTOTAL, CKMB, CKMBINDEX, TROPONINI in the last 168 hours.  BNP: Invalid input(s): POCBNP  CBG: Recent Labs  Lab 04/24/20 1904 04/24/20 2345 04/25/20 0308 04/25/20 0755  04/25/20 1229  GLUCAP 99 201* 154* 115* 166*    Microbiology: Results for orders placed or performed during the hospital encounter of 04/23/20  SARS Coronavirus 2 by RT PCR (hospital order, performed in [facility] hospital lab) Nasopharyngeal Nasopharyngeal Swab     Status: Abnormal   Collection Time: 04/23/20  8:15 AM   Specimen: Nasopharyngeal Swab  Result Value Ref Range Status   SARS Coronavirus 2 POSITIVE (A) NEGATIVE Final    Comment: RESULT CALLED TO, READ BACK BY AND VERIFIED WITH: SAMANTHA HAMILTON ON 04/23/20 AT 1052 QSD (NOTE) SARS-CoV-2 target nucleic acids are DETECTED  SARS-CoV-2 RNA is generally detectable in upper respiratory specimens  during the acute phase of infection.  Positive results are indicative  of the presence of the identified virus, but do not rule out bacterial infection or co-infection with other pathogens not detected by the test.  Clinical correlation with patient history and  other diagnostic information is necessary to determine patient infection status.  The expected result is negative.  Fact Sheet for Patients:   StrictlyIdeas.no   Fact Sheet for Healthcare Providers:   BankingDealers.co.za    This test is not yet approved or cleared by the [place] FDA and  has been authorized for detection and/or diagnosis of SARS-CoV-2 by FDA under an Emergency Use Authorization (EUA).  This EUA will remain in effect (meaning t his test can be used) for  the duration of  the COVID-19 declaration under Section 564(b)(1) of the Act, 21 U.S.C. section 360-bbb-3(b)(1), unless the authorization is terminated or revoked sooner.  Performed at [facility], [address] [place], [place] [address]     Coagulation Studies: No results for input(s): LABPROT, INR in the last 72 hours.  Urinalysis: No results for input(s): COLORURINE, LABSPEC, PHURINE, GLUCOSEU, HGBUR, BILIRUBINUR, KETONESUR,  PROTEINUR, UROBILINOGEN, NITRITE, LEUKOCYTESUR in the last 72 hours.  Invalid input(s): APPERANCEUR    Imaging: EEG  Result Date: 04/23/2020 Lora Havens, MD     04/23/2020  5:47 PM [name] MRN: [id number] Epilepsy Attending: Lora Havens Referring Physician/Provider: Dr Amie Portland Date: 04/23/2020 Duration: 21.32 mins Patient history: 50yo M with seizure like activity. EEG to evaluate for seizure. Level of alertness: Awake AEDs during EEG study: GBP, LEV Technical aspects: This EEG study was done with scalp electrodes positioned according to the 10-20 International system of electrode placement. Electrical activity was acquired at a sampling rate of '500Hz'$  and reviewed with a high frequency filter of '70Hz'$  and a low frequency filter of '1Hz'$ . EEG data were recorded continuously and digitally stored. Description: The posterior dominant rhythm consists of 8 Hz activity of moderate voltage (25-35 uV) seen predominantly in posterior head regions, symmetric and reactive to eye opening and eye closing. Hyperventilation and photic stimulation were not performed.   IMPRESSION: This study is within normal limits. No seizures or epileptiform discharges were seen throughout the recording. Priyanka Barbra Sarks     Medications:   . dextrose 5 % and 0.9% NaCl Stopped (04/24/20 0422)   . allopurinol  200 mg Oral Daily  . amiodarone  200 mg Oral BID  . apixaban  5 mg Oral BID  . atorvastatin  80 mg Oral QPM  . bumetanide  2 mg Oral BID  . Chlorhexidine Gluconate Cloth  6 each Topical Q0600  . cholecalciferol  1,000 Units Oral Daily  . epoetin (EPOGEN/PROCRIT) injection  10,000 Units Intravenous Q T,Th,Sa-HD  . ezetimibe  10 mg Oral Daily  . FLUoxetine  80 mg Oral Daily  . gabapentin  100 mg Oral BID  . insulin aspart  0-6 Units Subcutaneous Q4H  . isosorbide mononitrate  60 mg Oral Daily  . metoprolol succinate  100 mg Oral Daily  . mirtazapine  7.5 mg Oral QHS  . multivitamin  with minerals  1 tablet Oral Daily  . pantoprazole  40 mg Oral Daily  . ranolazine  500 mg Oral BID  . sevelamer carbonate  2,400 mg Oral TID WC  . ticagrelor  90 mg Oral BID   acetaminophen, albuterol, nitroGLYCERIN, ondansetron **OR** ondansetron (ZOFRAN) IV  Assessment/ Plan:  Edwin [name] is a 50 y.o.  male with end stage renal disease on hemodialysis, hypertension, hyperlipidemia, diabetes mellitus type II, atrial fibrillation, coronary artery disease, systolic congestive heart failure, gout, depression who is admitted to [facility] on 04/23/2020 for Recurrent episodes of unresponsiveness [R41.89]  [facility] [name] [facility] RIJ permcath  1. End stage renal disease: Continue on TTS COVID shift. He is scheduled for [place]. Family is responsible for transportation.   2. Hypertension: home regimen of bumetanide, amlodipine, metoprolol  3. Anemia of chronic kidney disease: Mircera as outpatient.  - EPO with HD treatment  4. Secondary Hyperparathyroidism:  - sevelamer with meals.    LOS: 2 Edwin [name] 1/26/20222:30 PM

## 2020-04-25 NOTE — ED Notes (Signed)
Patient contacted his wife who called to ED stating that patient was experiencing chest pain with burning and light headedness . EKG done, vitals checked (all stable). Patient stating that chest is painful to touch and he believes it is from coughing too much. Patient states that pain is in upper left chest near pacemaker incision. States that he feels better. Dr. Starla Link was contacted who advise he will review EKG and would like to order troponin. Once outpatient dialysis is scheduled, patient will be able to DC home as long as labs are good. Patient notified of plan and agrees. Patient in no acute distress.

## 2020-04-25 NOTE — ED Notes (Signed)
Patient advises that wife already set up outpatient dialysis in [facility]. Waiting on troponin levels for DC.

## 2020-04-25 NOTE — ED Notes (Signed)
Discharge instructions reviewed with pt, Pt calm , collective, denied pain or sob .

## 2020-04-25 NOTE — Evaluation (Signed)
Physical Therapy Evaluation Patient Details Name: Edwin [name] MRN: [id number] DOB: 07/27/1970 Today's Date: 04/25/2020   History of Present Illness  Per MD notes: Pt is a 50 year old male with history of end-stage renal disease on hemodialysis, hypertension, hyperlipidemia, diabetes mellitus type 2, PAF, CAD with stent, chronic systolic heart failure, status post AICD, gout, depression presented from dialysis unit via EMS for evaluation of unresponsiveness with concern for seizure.  MD assessment includes: Unresponsiveness/seizure-like activity/cardiac syncope, CAD with stent with EF 15-20%, HTN, and Covid-19.    Clinical Impression  Pt was pleasant and motivated to participate during the session.  Pt demonstrated good functional strength and stability throughout.  Pt stated that secondary to cardiac history that his max amb distance is "50 feet" at baseline.  Pt was steady with room ambulation with a RW with only light touch on the RW.  Pt reported feeling that functionally was at his baseline with no concerns regarding discharging back to prior living situation.  Will complete PT orders at this time but will reassess pt pending a change in status upon receipt of new PT orders.      Follow Up Recommendations No PT follow up    Equipment Recommendations  None recommended by PT    Recommendations for Other Services       Precautions / Restrictions Precautions Precautions: Other (comment) Precaution Comments: Seizure precautions Restrictions Weight Bearing Restrictions: No      Mobility  Bed Mobility Overal bed mobility: Independent                  Transfers Overall transfer level: Modified independent Equipment used: Rolling walker (2 wheeled)             General transfer comment: Very good eccentric and concentric control and stability  Ambulation/Gait Ambulation/Gait assistance: Modified independent (Device/Increase time) Gait Distance (Feet): 15  Feet Assistive device: Rolling walker (2 wheeled)   Gait velocity: WNL   General Gait Details: Pt steady with RW without the need to lean on the walker for support; SpO2 and HR WNL  Stairs            Wheelchair Mobility    Modified Rankin (Stroke Patients Only)       Balance Overall balance assessment: No apparent balance deficits (not formally assessed)                                           Pertinent Vitals/Pain Pain Assessment: No/denies pain    Home Living Family/patient expects to be discharged to:: Private residence Living Arrangements: Spouse/significant other Available Help at Discharge: Family;Available 24 hours/day Type of Home: House Home Access: Stairs to enter Entrance Stairs-Rails: Right;Left;Can reach both Entrance Stairs-Number of Steps: 2 Home Layout: One level Home Equipment: Walker - standard;Walker - 4 wheels;Cane - single point;Bedside commode      Prior Function Level of Independence: Independent with assistive device(s)         Comments: Mod Ind Amb with a SPC 50 feet max, w/c for longer distances, Ind with ADLs, one fall in last 6 months secondary to dog running into him     Hand Dominance        Extremity/Trunk Assessment   Upper Extremity Assessment Upper Extremity Assessment: Overall WFL for tasks assessed    Lower Extremity Assessment Lower Extremity Assessment: Overall WFL for tasks assessed  Communication   Communication: No difficulties  Cognition Arousal/Alertness: Awake/alert Behavior During Therapy: WFL for tasks assessed/performed Overall Cognitive Status: Within Functional Limits for tasks assessed                                        General Comments      Exercises     Assessment/Plan    PT Assessment Patent does not need any further PT services  PT Problem List         PT Treatment Interventions      PT Goals (Current goals can be found in the Care  Plan section)  Acute Rehab PT Goals PT Goal Formulation: All assessment and education complete, DC therapy    Frequency     Barriers to discharge        Co-evaluation               AM-PAC PT "6 Clicks" Mobility  Outcome Measure Help needed turning from your back to your side while in a flat bed without using bedrails?: None Help needed moving from lying on your back to sitting on the side of a flat bed without using bedrails?: None Help needed moving to and from a bed to a chair (including a wheelchair)?: None Help needed standing up from a chair using your arms (e.g., wheelchair or bedside chair)?: None Help needed to walk in hospital room?: None Help needed climbing 3-5 steps with a railing? : None 6 Click Score: 24    End of Session Equipment Utilized During Treatment: Gait belt Activity Tolerance: Patient tolerated treatment well Patient left: in bed;with call bell/phone within reach Nurse Communication: Mobility status PT Visit Diagnosis: Muscle weakness (generalized) (M62.81)    Time: 1030-1110 PT Time Calculation (min) (ACUTE ONLY): 40 min   Charges:   PT Evaluation $PT Eval Low Complexity: 1 Low          D. Royetta Asal PT, DPT 04/25/20, 11:39 AM

## 2020-04-25 NOTE — ED Notes (Signed)
To patient bedside.  Patient c/o chest pain.  On assessment, patient aAOx3.  Respirations regular and non labored.  12-lead EKG done.  Patient states pain is from coughing.  No SOB/ DOE.  Patient c/o same chest pain while writer placed EKG leads on chest.  NAD.  Skin warm and dry.

## 2020-04-25 NOTE — Progress Notes (Signed)
Contacted today about patient being ready for discharge, spoke with Lauren AA at [place] to get transportation secured for [place] in [place]. Unfortunately, Lauren nor Siobhan SW were able to get transportation set up for tomorrow. Per the clinic, they made several calls to patient's wife to get information on patient insurance to get transportation set up. Patient has traditional Medicaid which requires patient to set up transportation. Wife was called to be notified about this and stated that "it is not my responsibility to arrange transportation, it is the clinics and I don't care anymore if he gets to dialysis, I am done!" and hung up. Spoke back with clinic to inform them of conversation , they stated that their conversation with the wife was similar. Siobhan SW at clinic stated that she will try again to call DSS to get transportation arranged for patient. However, give the time now, even if successful, the transportation will not be set up for tomorrow. Clinic plans to update as soon as possible.

## 2020-04-26 ENCOUNTER — Telehealth
Admit: 2020-04-26 | Discharge: 2020-04-27 | Payer: MEDICARE | Attending: Student in an Organized Health Care Education/Training Program | Primary: Student in an Organized Health Care Education/Training Program

## 2020-04-26 DIAGNOSIS — U071 COVID-19: Principal | ICD-10-CM

## 2020-04-26 LAB — HEPATITIS B DNA, ULTRAQUANTITATIVE, PCR
HBV DNA SERPL PCR-ACNC: NOT DETECTED IU/mL
HBV DNA SERPL PCR-LOG IU: UNDETERMINED log10 IU/mL

## 2020-04-26 LAB — HEPATITIS B SURFACE ANTIBODY, QUANTITATIVE: Hep B S AB Quant (Post): 266.2 m[IU]/mL (ref >9.9)

## 2020-04-26 MED ORDER — BENZONATATE 100 MG CAPSULE
ORAL_CAPSULE | Freq: Three times a day (TID) | ORAL | 1 refills | 10.00000 days | Status: CP | PRN
Start: 2020-04-26 — End: 2021-04-26
  Filled 2020-04-27: qty 30, 10d supply, fill #0

## 2020-04-26 MED ORDER — MIRTAZAPINE 7.5 MG TABLET
ORAL_TABLET | Freq: Every evening | ORAL | 0 refills | 30.00000 days | Status: CP
Start: 2020-04-26 — End: 2020-05-08
  Filled 2020-04-27: qty 30, 30d supply, fill #0

## 2020-04-26 NOTE — Unmapped (Signed)
Internal Medicine Phone Visit    This visit is conducted via telephone.    Contact Information  Person Contacted: Patient  Contact Phone number: [phone] (home)   Is there someone else in the room? No.   Patient agreed to a phone visit    Mr. Borden is a 50 y.o. male  participating in a telephone encounter.    Reason for Call  Cough in the setting of known COVID-19 positive    Subjective:    50 year old male with history of end-stage renal disease on hemodialysis, hypertension, hyperlipidemia, diabetes mellitus type 2, PAF, CAD with stent, chronic systolic heart failure s/p AICD, gout, and depression who presents for phone visit for cough in the setting of known COVID.    Patient was in dialysis became hypotensive and unresponsive so was taken by EMS to [facility] hospital. [facility] concerning but was ruled out by EEG and brain imaging. Felt it was likely hypotension due excess fluid removal. Blood pressures at home have been. Positive COVID: 04/23/2020. In hospital BP was 96%. On the date of his positive test he was asymptomatic. On 01/25 (Tuesday) he developed a cough. Cough is productive-clear mucus. Due to his frequent coughing he has sternal pain and rib pain. Sternal pain is a dull substernal pain. Pain is intermittent only when coughing. He endorses chills for the past day, one episode of diarrhea today, and a synthetic taste in his mouth. His appetite is reduced as is his intake of water. 2 bottles per day. He denies any fevers, headache, shortness of breath, abdominal pain, nausea or vomiting.    I have reviewed the problem list, medications, and allergies and have updated/reconciled them if needed.    Objective:  Patient appears in no acute distress. No increased work of breathing. Speaking in complete sentences without issue.     PHQ-9 Score:     GAD-7 Score:     Assessment & Plan:  1. COVID-19          COVID-19  Patient initial symptoms developed 01/25. First positive test 01/24. Symptoms at this time include cough and chills. No evidence of shortness of breath on 01/26 pulse oximetry. No subjective reports of shortness of breath today. Given comorbidities meets level 1 criteria. Given ESRD will refer to [facility] COVID therapies.  - Tessalon pearls  - Covid therapies referral     Return for Next scheduled follow up.     Staffed with Dr. Delane Ginger, discussed        The patient reports they are currently: at home. I spent 12 minutes on the phone with the patient on the date of service. I spent an additional 5 minutes on pre- and post-visit activities on the date of service.     The patient was physically located in [place] or a state in which I am permitted to provide care. The patient and/or parent/guardian understood that s/he Trovato incur co-pays and cost sharing, and agreed to the telemedicine visit. The visit was reasonable and appropriate under the circumstances given the patient's presentation at the time.    The patient and/or parent/guardian has been advised of the potential risks and limitations of this mode of treatment (including, but not limited to, the absence of in-person examination) and has agreed to be treated using telemedicine. The patient's/patient's family's questions regarding telemedicine have been answered.     If the visit was completed in an ambulatory setting, the patient and/or parent/guardian has also been advised to contact their provider???s office  for worsening conditions, and seek emergency medical treatment and/or call 911 if the patient deems either necessary.

## 2020-04-26 NOTE — Unmapped (Signed)
Pt currently has dialysis appt scheduled Saturday 04/28/20.  Today a Medicaid Urgent ride did come to pick him up however they were running too late for him to go to the [facility] HD appt.     Pt has been told that he can miss 2 HD appts in a row but after that needs to go to ED to be admitted for HD.  (did have HD 04/24/20)  Today pt's only complaints are cough and synthetic bad taste in mouth.  Pt is Taking cough drops.  Wife Consuelo Pandy understanding that pt does not need diuresis today- and unable to come to cardiology clinic d/t covid diagnoses.  Pt has no swollen extremities , pt is not short of breath.  Pt is taking all medications as prescribed.  Pt requesting cough medicine, denies need to be seen in [facility] covid plus clinic today Will call if feels needs covid symptoms assessmnt.     Pt w/ no c/o SOB, no c/o CP today.    RN asked wife [name] to get off phone and allow pt to discuss symptoms. [name] calmed down by the end of the call.    Will notify providers .

## 2020-04-27 ENCOUNTER — Ambulatory Visit: Admit: 2020-04-27 | Discharge: 2020-04-28 | Payer: MEDICARE

## 2020-04-27 DIAGNOSIS — U071 COVID-19: Principal | ICD-10-CM

## 2020-04-27 MED ADMIN — sotrovimab 500 mg in sodium chloride (NS) 0.9 % 100 mL IVPB PREMADE: 500 mg | INTRAVENOUS | @ 14:00:00 | Stop: 2020-04-27

## 2020-04-27 MED FILL — DICLOFENAC 1 % TOPICAL GEL: TOPICAL | 34 days supply | Qty: 200 | Fill #3

## 2020-04-27 MED FILL — GABAPENTIN 100 MG CAPSULE: 15 days supply | Qty: 30 | Fill #0

## 2020-04-27 MED FILL — RANOLAZINE ER 500 MG TABLET,EXTENDED RELEASE,12 HR: ORAL | 30 days supply | Qty: 60 | Fill #2

## 2020-04-27 MED FILL — ELIQUIS 5 MG TABLET: ORAL | 30 days supply | Qty: 60 | Fill #8

## 2020-04-27 MED FILL — ISOSORBIDE MONONITRATE ER 60 MG TABLET,EXTENDED RELEASE 24 HR: ORAL | 54 days supply | Qty: 54 | Fill #2

## 2020-04-27 MED FILL — VENTOLIN HFA 90 MCG/ACTUATION AEROSOL INHALER: RESPIRATORY_TRACT | 25 days supply | Qty: 18 | Fill #1

## 2020-04-27 NOTE — Unmapped (Addendum)
Please try tessalon pearls for cough relief.     A referral was placed for

## 2020-04-27 NOTE — Unmapped (Signed)
Post Infusion Contacts     After your Regeneron therapy, you can contact your primary care provider or our Clinical Contact Center [phone] for minor symptoms.   For severe symptoms, please dial 911.      Those receiving monoclonal or plasma products should wait 90 days until getting immunized.      Thank you for choosing [facility] at [facility]         ??FACT SHEET FOR PATIENTS, PARENTS, AND CAREGIVERS   Emergency Use Authorization (EUA) of Sotrovimab for the Treatment of Coronavirus Disease 2019 (COVID-19)   You are being given a medicine called sotrovimab for the treatment of coronavirus disease 2019 (COVID-19). This Fact Sheet contains information to help you understand the potential risks and potential benefits of taking sotrovimab, which you Custard receive.   Receiving sotrovimab Folson benefit certain people with COVID-19.   Read this Fact Sheet for information about sotrovimab. Talk to your healthcare provider if you have any questions. It is your choice to receive sotrovimab or stop it at any time.   What is COVID-19?     COVID-19 is caused by a virus called a coronavirus. People can get COVID-19 through contact with another person who has the virus.   COVID-19 illnesses have ranged from very mild (including some with no reported symptoms) to severe, including illness resulting in death. While information so far suggests that most COVID-19 illness is mild, serious illness can happen and Saephan cause other medical conditions to become worse. People of all ages with severe, long-lasting (chronic) medical conditions like heart disease, lung disease, diabetes, for example, and other conditions including obesity, seem to be at higher risk of being hospitalized for COVID-19. Older age, with or without other conditions, also places people at higher risk of being hospitalized for COVID-19.   What are the symptoms of COVID-19?     The symptoms of COVID-19 are fever, cough, and shortness of breath, which Altamirano appear 2 to 14 days after exposure. Serious illness, including breathing problems, can occur and Loth cause your other medical conditions to become worse.   What is sotrovimab?     Sotrovimab is an investigational medicine used to treat mild-to-moderate symptoms of COVID-19 in adults and children (17 years of age and older weighing at least 88 pounds [40 kg]) with positive results of direct SARS-CoV-2 viral testing, and who are at high risk of progression to severe COVID-19, including hospitalization or death. Sotrovimab is investigational because it is still being studied. There is limited information about the safety and effectiveness of using sotrovimab to treat people with mild-to-moderate COVID-19.   The U.S. Food & Drug Administration (FDA) has authorized the emergency use of sotrovimab for the treatment of COVID-19 under an Emergency Use Authorization (EUA). For more information on EUA, see the What is an Emergency Use Authorization (EUA)? section at the end of this Fact Sheet.     Who should not receive sotrovimab?   Do not take sotrovimab if you have had a serious allergic reaction to sotrovimab or to any of the ingredients in sotrovimab.     What are the ingredients in sotrovimab?   Active ingredient: sotrovimab   Inactive ingredients: L-histidine, L-histidine monohydrochloride, L-methionine, polysorbate 80, and sucrose   What should I tell my healthcare provider before I receive sotrovimab?   Tell your healthcare provider about all of your medical conditions, including if you:   ??? Have any allergies   ??? Have had a serious allergic  reaction to sotrovimab or to any of the ingredients in sotrovimab   ??? Are pregnant or plan to become pregnant   Are breastfeeding or plan to breastfeed   ??? Have any serious illnesses   ??? Are taking any medicines (prescription, over-the-counter, vitamins, or herbal products)      How will I receive sotrovimab?   ??? You will receive 1 dose of sotrovimab.   ??? Sotrovimab will be given to you through a vein (intravenous or IV infusion) over 30 minutes.   ??? You will be observed by your healthcare provider for at least 1 hour after you receive sotrovimab.      What are the important possible side effects of sotrovimab?   Possible side effects of sotrovimab are:   ??? Allergic reactions. Allergic reactions can happen during and after infusion with    sotrovimab. Tell your healthcare provider right away if you get any of the following signs and symptoms of allergic reactions: fever; difficulty breathing; low oxygen level in your blood; chills; tiredness; fast or slow heart rate; chest discomfort or pain; weakness; confusion; nausea; headache; shortness of breath; low or high blood pressure; wheezing; swelling of your lips, face, or throat; rash including hives; itching; muscle aches; dizziness; feeling faint; and sweating.      The side effects of getting any medicine through a vein Tiedeman include brief pain, bleeding, bruising of the skin, soreness, swelling, and possible infection at the infusion site.   These are not all the possible side effects of sotrovimab. Not many people have been given sotrovimab. Serious and unexpected side effects Puerto happen. Sotrovimab is still being studied, so it is possible that all of the risks are not known at this time.   It is possible that sotrovimab could interfere with your body's own ability to fight off a future infection of SARS-CoV-2. Similarly, sotrovimab Pitcher reduce your body's immune response to a vaccine for SARS-CoV-2. Specific studies have not been conducted to address these possible risks. Talk to your healthcare provider if you have any questions.     What other treatment choices are there?       Like sotrovimab, FDA Wecker allow for the emergency use of other medicines to treat people with COVID-19. Go to https://garcia.com/ for information on the emergency use of other medicines that are not approved by FDA to treat people with COVID-19. Your healthcare provider Fussner talk with you about clinical trials for which you Haugan be eligible.   It is your choice to be treated or not to be treated with sotrovimab. Should you decide not to receive sotrovimab, or stop it at any time, it will not change your standard medical care.     What if I am pregnant or breastfeeding?   There is no experience treating pregnant women or breastfeeding mothers with sotrovimab. For a mother and unborn baby, the benefit of receiving sotrovimab Kronick be greater than the risk from the treatment. If you are pregnant or breastfeeding, discuss your options and specific situation with your healthcare provider.     How do I report side effects with sotrovimab?   Tell your healthcare provider right away if you have any side effects that bother you or do not go away.   Report side effects to FDA MedWatch at MacRetreat.be or call 1-800-FDA-1088, or call the [employer] COVID Contact Center at 1-866-GSK-COVID 314-763-3264).     How can I learn more?   ??? Ask your healthcare provider   ??? Visit  www.sotrovimabinfo.com   ??? Call the [employer] COVID Contact Center at 1-866-GSK-COVID 6151122894)

## 2020-04-27 NOTE — Unmapped (Signed)
COVID Infusion Therapy Questionnaire    Have you received a positive COVID test result (excluding antibody testing) AND are currently in an outpatient setting? Yes  Do you have at least one mild or moderate COVID symptom that began no more than 7 days ago? Yes  What date did you test COVID+ (mm/dd/yyyy): 04/24/2020  What date did your symptoms start (mm/dd/yyyy): 04/19/2020  Which of the following COVID symptoms are you experiencing: Cough  Do you (1) have new oxygen requirements or (2) increased oxygen requirement due to COVID-19? No   Patient is under 74 years of age? No      Vaccination status:  ??? Patient has received a COVID-19 vaccine: Yes  ??? Manufacturer: J&J  ??? # of doses received: 1 dose  ??? What date did you receive your last dose (mm/dd/yyyy): 05/2019      Screening for COVID Therapeutics (18 and up)  ??? Over the age of 27: No  ??? Pregnant: N/A  ??? Recent delivery within 6 weeks: NA  ??? BMI 35 or above: Yes  o Patient BMI: 44.9  ??? Immunosuppressive disease (e.g., cancer, not in remission; solid organ transplant; HIV; CD4 <200 cells/m3): No  ??? Currently or in the past 3 months receiving immunosuppressive treatment (e.g., chemotherapy; rituximab; steroid use at least 20 mg/day or at least 2 mg/kg/day prednisone or equivalent for at least 14 days): No  ??? Diabetes if BMI > 30: Yes  ??? Cardiovascular disease if BMI >30: Yes  ??? Hypertension if BMI >30: Yes  ??? Chronic lung disease (e.g., COPD, cystic fibrosis, pulmonary arterial hypertension - requiring daily inhaled steroids plus a second controller medicine or history of ICU admission for asthma): No  ??? Neurodevelopmental disorders affecting respiratory function (e.g., cerebral palsy, etc): No  ??? Sickle cell disease: No   ??? Congenital heart disease causing significant hemodynamic compromise: No  ??? Medical related technological dependence (e.g., tracheostomy, gastrostomy, or positive pressure ventilation [not related to COVID-19]): No      Resolution  Patient qualifies - Because you meet the prioritized eligibility criteria at [facility] at this time, we can offer treatment.  These treatments Heigl help to prevent progression of mild-to-moderate symptoms to severe symptoms and/or hospitalization in those who are confirmed high-risk COVID (+) and are within 5 days of symptom onset. The U.S. FDA has issued an Emergency Use Authorization to permit the emergency use of these unapproved treatments. At this time there are no approved and available alternative treatments for out-patients. The options offered are based on drug and appointment availability. Paxlovid is an oral antiviral medication and is currently the best treatment. Due to many drug interactions we require a provider level evaluation to determine if you can receive this drug. That provider will send the prescription to a [facility] pharmacy if this treatment will work for you. Marella Bile is a one-time IV infusion of monoclonal antibodies. Remdesivir is an antiviral medication that is also an infusion. It was formerly given only to hospitalized patients. This medication is now available for out-patients, given by IV, 3 doses over 3 consecutive days. While this medication was approved by the FDA for treatment of hospitalized patients, outpatient treatment is considered off label. You will have standard copays, deductibles or coinsurance out-of-pocket cost sharing for these services as directed by your medical insurance plan. (Use available documentation to answer any questions.)  Patient Agreed to therapy. Scheduling notified.  Patient County: [facility]

## 2020-04-27 NOTE — Unmapped (Signed)
0830 Pt arrived for Sotrovimab for COVID.     0839 Sotrovimab 500 mg IV infusing over 30 minutes via PIV    0912 Sotrovimab infusion complete.      1012 1 hr monitoring completed.Flushed with NS. PIV removed. Reviewed AVS with pt. Reverbalized understanding.  Dc'd from Infusion Center.

## 2020-04-29 ENCOUNTER — Inpatient Hospital Stay
Admission: EM | Admit: 2020-04-29 | Discharge: 2020-05-01 | DRG: 291 | Disposition: A | Discharge status: Home / Self Care | Payer: Medicare Other | Attending: Internal Medicine | Admitting: Internal Medicine

## 2020-04-29 ENCOUNTER — Emergency Department: Payer: Medicare Other

## 2020-04-29 ENCOUNTER — Other Ambulatory Visit: Payer: Self-pay

## 2020-04-29 DIAGNOSIS — Z6841 Body Mass Index (BMI) 40.0 and over, adult: Secondary | ICD-10-CM

## 2020-04-29 DIAGNOSIS — Z888 Allergy status to other drugs, medicaments and biological substances status: Secondary | ICD-10-CM

## 2020-04-29 DIAGNOSIS — E785 Hyperlipidemia, unspecified: Secondary | ICD-10-CM | POA: Diagnosis present

## 2020-04-29 DIAGNOSIS — I132 Hypertensive heart and chronic kidney disease with heart failure and with stage 5 chronic kidney disease, or end stage renal disease: Principal | ICD-10-CM | POA: Diagnosis present

## 2020-04-29 DIAGNOSIS — Z79899 Other long term (current) drug therapy: Secondary | ICD-10-CM

## 2020-04-29 DIAGNOSIS — I5022 Chronic systolic (congestive) heart failure: Secondary | ICD-10-CM | POA: Diagnosis present

## 2020-04-29 DIAGNOSIS — F32A Depression, unspecified: Secondary | ICD-10-CM | POA: Diagnosis not present

## 2020-04-29 DIAGNOSIS — M109 Gout, unspecified: Secondary | ICD-10-CM | POA: Diagnosis present

## 2020-04-29 DIAGNOSIS — E1122 Type 2 diabetes mellitus with diabetic chronic kidney disease: Secondary | ICD-10-CM

## 2020-04-29 DIAGNOSIS — Z8249 Family history of ischemic heart disease and other diseases of the circulatory system: Secondary | ICD-10-CM

## 2020-04-29 DIAGNOSIS — U071 COVID-19: Secondary | ICD-10-CM

## 2020-04-29 DIAGNOSIS — I482 Chronic atrial fibrillation, unspecified: Secondary | ICD-10-CM | POA: Diagnosis not present

## 2020-04-29 DIAGNOSIS — I420 Dilated cardiomyopathy: Secondary | ICD-10-CM | POA: Diagnosis present

## 2020-04-29 DIAGNOSIS — N2581 Secondary hyperparathyroidism of renal origin: Secondary | ICD-10-CM | POA: Diagnosis present

## 2020-04-29 DIAGNOSIS — I4510 Unspecified right bundle-branch block: Secondary | ICD-10-CM | POA: Diagnosis present

## 2020-04-29 DIAGNOSIS — R55 Syncope and collapse: Secondary | ICD-10-CM | POA: Diagnosis not present

## 2020-04-29 DIAGNOSIS — R778 Other specified abnormalities of plasma proteins: Secondary | ICD-10-CM | POA: Diagnosis present

## 2020-04-29 DIAGNOSIS — I10 Essential (primary) hypertension: Secondary | ICD-10-CM | POA: Diagnosis present

## 2020-04-29 DIAGNOSIS — Z885 Allergy status to narcotic agent status: Secondary | ICD-10-CM

## 2020-04-29 DIAGNOSIS — I255 Ischemic cardiomyopathy: Secondary | ICD-10-CM | POA: Diagnosis present

## 2020-04-29 DIAGNOSIS — G4733 Obstructive sleep apnea (adult) (pediatric): Secondary | ICD-10-CM | POA: Diagnosis present

## 2020-04-29 DIAGNOSIS — Z794 Long term (current) use of insulin: Secondary | ICD-10-CM

## 2020-04-29 DIAGNOSIS — R079 Chest pain, unspecified: Secondary | ICD-10-CM

## 2020-04-29 DIAGNOSIS — I252 Old myocardial infarction: Secondary | ICD-10-CM

## 2020-04-29 DIAGNOSIS — R569 Unspecified convulsions: Secondary | ICD-10-CM | POA: Diagnosis present

## 2020-04-29 DIAGNOSIS — Z9115 Patient's noncompliance with renal dialysis: Secondary | ICD-10-CM

## 2020-04-29 DIAGNOSIS — Z992 Dependence on renal dialysis: Secondary | ICD-10-CM

## 2020-04-29 DIAGNOSIS — N186 End stage renal disease: Secondary | ICD-10-CM | POA: Diagnosis present

## 2020-04-29 DIAGNOSIS — E1165 Type 2 diabetes mellitus with hyperglycemia: Secondary | ICD-10-CM | POA: Diagnosis present

## 2020-04-29 DIAGNOSIS — K219 Gastro-esophageal reflux disease without esophagitis: Secondary | ICD-10-CM | POA: Diagnosis present

## 2020-04-29 DIAGNOSIS — Z825 Family history of asthma and other chronic lower respiratory diseases: Secondary | ICD-10-CM

## 2020-04-29 DIAGNOSIS — I2510 Atherosclerotic heart disease of native coronary artery without angina pectoris: Secondary | ICD-10-CM | POA: Diagnosis present

## 2020-04-29 DIAGNOSIS — Z833 Family history of diabetes mellitus: Secondary | ICD-10-CM

## 2020-04-29 DIAGNOSIS — Z950 Presence of cardiac pacemaker: Secondary | ICD-10-CM

## 2020-04-29 DIAGNOSIS — Z955 Presence of coronary angioplasty implant and graft: Secondary | ICD-10-CM

## 2020-04-29 DIAGNOSIS — Z7901 Long term (current) use of anticoagulants: Secondary | ICD-10-CM

## 2020-04-29 DIAGNOSIS — R0602 Shortness of breath: Secondary | ICD-10-CM

## 2020-04-29 DIAGNOSIS — R7989 Other specified abnormal findings of blood chemistry: Secondary | ICD-10-CM | POA: Diagnosis present

## 2020-04-29 DIAGNOSIS — I471 Supraventricular tachycardia: Secondary | ICD-10-CM | POA: Diagnosis present

## 2020-04-29 DIAGNOSIS — D631 Anemia in chronic kidney disease: Secondary | ICD-10-CM | POA: Diagnosis present

## 2020-04-29 DIAGNOSIS — Z720 Tobacco use: Secondary | ICD-10-CM

## 2020-04-29 LAB — LACTIC ACID, PLASMA
Lactic Acid, Venous: 2.1 mmol/L (ref 0.5–1.9)
Lactic Acid, Venous: 1.5 mmol/L (ref 0.5–1.9)

## 2020-04-29 LAB — CBC WITH DIFFERENTIAL/PLATELET
Abs Immature Granulocytes: 0.03 10*3/uL (ref 0.00–0.07)
Basophils Absolute: 0 10*3/uL (ref 0.0–0.1)
Basophils Relative: 0 %
Eosinophils Absolute: 0.1 10*3/uL (ref 0.0–0.5)
Eosinophils Relative: 1 %
HCT: 36.1 % — ABNORMAL LOW (ref 39.0–52.0)
Hemoglobin: 11.5 g/dL — ABNORMAL LOW (ref 13.0–17.0)
Immature Granulocytes: 1 %
Lymphocytes Relative: 17 %
Lymphs Abs: 1 10*3/uL (ref 0.7–4.0)
MCH: 28 pg (ref 26.0–34.0)
MCHC: 31.9 g/dL (ref 30.0–36.0)
MCV: 88 fL (ref 80.0–100.0)
Monocytes Absolute: 0.5 10*3/uL (ref 0.1–1.0)
Monocytes Relative: 9 %
Neutro Abs: 4.2 10*3/uL (ref 1.7–7.7)
Neutrophils Relative %: 72 %
Platelets: 270 10*3/uL (ref 150–400)
RBC: 4.1 MIL/uL — ABNORMAL LOW (ref 4.22–5.81)
RDW: 15.9 % — ABNORMAL HIGH (ref 11.5–15.5)
WBC: 5.8 10*3/uL (ref 4.0–10.5)
nRBC: 0 % (ref 0.0–0.2)

## 2020-04-29 LAB — COMPREHENSIVE METABOLIC PANEL
ALT: 11 U/L (ref 0–44)
AST: 23 U/L (ref 15–41)
Albumin: 2.9 g/dL — ABNORMAL LOW (ref 3.5–5.0)
Alkaline Phosphatase: 97 U/L (ref 38–126)
Anion gap: 15 (ref 5–15)
BUN: 27 mg/dL — ABNORMAL HIGH (ref 6–20)
CO2: 19 mmol/L — ABNORMAL LOW (ref 22–32)
Calcium: 8.2 mg/dL — ABNORMAL LOW (ref 8.9–10.3)
Chloride: 103 mmol/L (ref 98–111)
Creatinine, Ser: 6.42 mg/dL — ABNORMAL HIGH (ref 0.61–1.24)
GFR, Estimated: 10 mL/min — ABNORMAL LOW (ref >60)
Glucose, Bld: 244 mg/dL — ABNORMAL HIGH (ref 70–99)
Potassium: 4.6 mmol/L (ref 3.5–5.1)
Sodium: 137 mmol/L (ref 135–145)
Total Bilirubin: 0.6 mg/dL (ref 0.3–1.2)
Total Protein: 7.9 g/dL (ref 6.5–8.1)

## 2020-04-29 LAB — BLOOD GAS, VENOUS
Acid-base deficit: 5.1 mmol/L — ABNORMAL HIGH (ref 0.0–2.0)
Bicarbonate: 20.6 mmol/L (ref 20.0–28.0)
O2 Saturation: 66.7 %
Patient temperature: 37
pCO2, Ven: 40 mmHg — ABNORMAL LOW (ref 44.0–60.0)
pH, Ven: 7.32 (ref 7.250–7.430)
pO2, Ven: 38 mmHg (ref 32.0–45.0)

## 2020-04-29 LAB — TROPONIN I (HIGH SENSITIVITY)
Troponin I (High Sensitivity): 31 ng/L — ABNORMAL HIGH (ref <18)
Troponin I (High Sensitivity): 31 ng/L — ABNORMAL HIGH (ref <18)

## 2020-04-29 LAB — CBG MONITORING, ED
Glucose-Capillary: 212 mg/dL — ABNORMAL HIGH (ref 70–99)
Glucose-Capillary: 151 mg/dL — ABNORMAL HIGH (ref 70–99)
Glucose-Capillary: 145 mg/dL — ABNORMAL HIGH (ref 70–99)

## 2020-04-29 LAB — PROCALCITONIN: Procalcitonin: 0.13 ng/mL

## 2020-04-29 LAB — BRAIN NATRIURETIC PEPTIDE: B Natriuretic Peptide: 574.2 pg/mL — ABNORMAL HIGH (ref 0.0–100.0)

## 2020-04-29 MED ORDER — ISOSORBIDE MONONITRATE ER 60 MG PO TB24
60.0000 mg | ORAL_TABLET | ORAL | Status: DC
  Administered 2020-04-30: 60 mg via ORAL
  Filled 2020-04-29: qty 1

## 2020-04-29 MED ORDER — INSULIN ASPART 100 UNIT/ML ~~LOC~~ SOLN
0.0000 [IU] | Freq: Three times a day (TID) | SUBCUTANEOUS | Status: DC
  Administered 2020-04-30: 5 [IU] via SUBCUTANEOUS
  Administered 2020-05-01: 2 [IU] via SUBCUTANEOUS
  Filled 2020-04-29 (×2): qty 1

## 2020-04-29 MED ORDER — PANTOPRAZOLE SODIUM 40 MG PO TBEC
40.0000 mg | DELAYED_RELEASE_TABLET | Freq: Every day | ORAL | Status: DC
  Administered 2020-04-30 – 2020-05-01 (×2): 40 mg via ORAL
  Filled 2020-04-29 (×2): qty 1

## 2020-04-29 MED ORDER — RANOLAZINE ER 500 MG PO TB12
500.0000 mg | ORAL_TABLET | Freq: Two times a day (BID) | ORAL | Status: DC
  Administered 2020-04-30 – 2020-05-01 (×3): 500 mg via ORAL
  Filled 2020-04-29 (×5): qty 1

## 2020-04-29 MED ORDER — GABAPENTIN 100 MG PO CAPS
100.0000 mg | ORAL_CAPSULE | Freq: Two times a day (BID) | ORAL | Status: DC
  Administered 2020-04-29 – 2020-05-01 (×4): 100 mg via ORAL
  Filled 2020-04-29 (×4): qty 1

## 2020-04-29 MED ORDER — ALLOPURINOL 100 MG PO TABS
200.0000 mg | ORAL_TABLET | Freq: Every day | ORAL | Status: DC
Start: 2020-04-29 — End: 2020-05-01
  Administered 2020-04-30 – 2020-05-01 (×2): 200 mg via ORAL
  Filled 2020-04-29 (×2): qty 2

## 2020-04-29 MED ORDER — ADULT MULTIVITAMIN W/MINERALS CH
1.0000 | ORAL_TABLET | Freq: Every day | ORAL | Status: DC
  Administered 2020-04-30 – 2020-05-01 (×2): 1 via ORAL
  Filled 2020-04-29 (×2): qty 1

## 2020-04-29 MED ORDER — AMIODARONE HCL 200 MG PO TABS
200.0000 mg | ORAL_TABLET | Freq: Two times a day (BID) | ORAL | Status: DC
  Administered 2020-04-29 – 2020-05-01 (×4): 200 mg via ORAL
  Filled 2020-04-29 (×5): qty 1

## 2020-04-29 MED ORDER — CHLORHEXIDINE GLUCONATE CLOTH 2 % EX PADS
6.0000 | MEDICATED_PAD | Freq: Every day | CUTANEOUS | Status: DC
  Filled 2020-04-29: qty 6

## 2020-04-29 MED ORDER — BUMETANIDE 1 MG PO TABS
2.0000 mg | ORAL_TABLET | Freq: Two times a day (BID) | ORAL | Status: DC
  Administered 2020-04-29 – 2020-05-01 (×4): 2 mg via ORAL
  Filled 2020-04-29 (×6): qty 2

## 2020-04-29 MED ORDER — INSULIN ASPART 100 UNIT/ML ~~LOC~~ SOLN
0.0000 [IU] | Freq: Every day | SUBCUTANEOUS | Status: DC
  Administered 2020-04-30: 3 [IU] via SUBCUTANEOUS
  Filled 2020-04-29: qty 1

## 2020-04-29 MED ORDER — DM-GUAIFENESIN ER 30-600 MG PO TB12: 1.0000 | ORAL_TABLET | Freq: Two times a day (BID) | ORAL | Status: DC | PRN

## 2020-04-29 MED ORDER — HYDRALAZINE HCL 20 MG/ML IJ SOLN: 5.0000 mg | INTRAMUSCULAR | Status: DC | PRN

## 2020-04-29 MED ORDER — EZETIMIBE 10 MG PO TABS
10.0000 mg | ORAL_TABLET | Freq: Every day | ORAL | Status: DC
Start: 2020-04-30 — End: 2020-05-01
  Administered 2020-04-30 – 2020-05-01 (×2): 10 mg via ORAL
  Filled 2020-04-29 (×2): qty 1

## 2020-04-29 MED ORDER — TICAGRELOR 90 MG PO TABS
90.0000 mg | ORAL_TABLET | Freq: Two times a day (BID) | ORAL | Status: DC
  Administered 2020-04-30 – 2020-05-01 (×3): 90 mg via ORAL
  Filled 2020-04-29 (×3): qty 1

## 2020-04-29 MED ORDER — ATORVASTATIN CALCIUM 80 MG PO TABS: 80.0000 mg | ORAL_TABLET | Freq: Every evening | ORAL | Status: DC

## 2020-04-29 MED ORDER — ACETAMINOPHEN 325 MG PO TABS: 650.0000 mg | ORAL_TABLET | Freq: Four times a day (QID) | ORAL | Status: DC | PRN

## 2020-04-29 MED ORDER — HYDROXYZINE HCL 10 MG PO TABS
10.0000 mg | ORAL_TABLET | Freq: Every day | ORAL | Status: DC
  Administered 2020-04-30: 10 mg via ORAL
  Filled 2020-04-29 (×2): qty 1

## 2020-04-29 MED ORDER — SEVELAMER CARBONATE 800 MG PO TABS
2400.0000 mg | ORAL_TABLET | Freq: Three times a day (TID) | ORAL | Status: DC
  Administered 2020-04-30 – 2020-05-01 (×4): 2400 mg via ORAL
  Filled 2020-04-29 (×5): qty 3

## 2020-04-29 MED ORDER — ALBUTEROL SULFATE HFA 108 (90 BASE) MCG/ACT IN AERS
2.0000 | INHALATION_SPRAY | RESPIRATORY_TRACT | Status: DC | PRN
  Filled 2020-04-29: qty 6.7

## 2020-04-29 MED ORDER — LORAZEPAM 2 MG/ML IJ SOLN
INTRAMUSCULAR | Status: AC
  Administered 2020-04-29: 1 mg
  Filled 2020-04-29: qty 1

## 2020-04-29 MED ORDER — APIXABAN 5 MG PO TABS
5.0000 mg | ORAL_TABLET | Freq: Two times a day (BID) | ORAL | Status: DC
  Administered 2020-04-29 – 2020-05-01 (×4): 5 mg via ORAL
  Filled 2020-04-29 (×4): qty 1

## 2020-04-29 MED ORDER — DICLOFENAC SODIUM 1 % EX GEL
2.0000 g | Freq: Three times a day (TID) | CUTANEOUS | Status: DC | PRN
  Filled 2020-04-29: qty 100

## 2020-04-29 MED ORDER — AMLODIPINE BESYLATE 10 MG PO TABS
10.0000 mg | ORAL_TABLET | Freq: Every day | ORAL | Status: DC
  Administered 2020-05-01: 10 mg via ORAL
  Filled 2020-04-29: qty 2
  Filled 2020-04-29: qty 1

## 2020-04-29 MED ORDER — METOPROLOL SUCCINATE ER 100 MG PO TB24
100.0000 mg | ORAL_TABLET | Freq: Every day | ORAL | Status: DC
  Administered 2020-05-01: 100 mg via ORAL
  Filled 2020-04-29: qty 1
  Filled 2020-04-29: qty 2

## 2020-04-29 MED ORDER — NITROGLYCERIN 0.4 MG/SPRAY TL SOLN
1.0000 | Status: DC | PRN
  Filled 2020-04-29: qty 4.9

## 2020-04-29 MED ORDER — VITAMIN D 25 MCG (1000 UNIT) PO TABS
1000.0000 [IU] | ORAL_TABLET | Freq: Every day | ORAL | Status: DC
  Administered 2020-04-30 – 2020-05-01 (×2): 1000 [IU] via ORAL
  Filled 2020-04-29 (×2): qty 1

## 2020-04-29 MED ORDER — INSULIN DETEMIR 100 UNIT/ML ~~LOC~~ SOLN
7.0000 [IU] | Freq: Every day | SUBCUTANEOUS | Status: DC
  Administered 2020-04-29 – 2020-04-30 (×2): 7 [IU] via SUBCUTANEOUS
  Filled 2020-04-29 (×3): qty 0.07

## 2020-04-29 MED ORDER — ISOSORBIDE MONONITRATE ER 60 MG PO TB24: 60.0000 mg | ORAL_TABLET | Freq: Every day | ORAL | Status: DC

## 2020-04-29 MED ORDER — ONDANSETRON HCL 4 MG PO TABS: 4.0000 mg | ORAL_TABLET | Freq: Four times a day (QID) | ORAL | Status: DC | PRN

## 2020-04-29 NOTE — ED Provider Notes (Signed)
[facility] Emergency Department Provider Note  ____________________________________________   Event Date/Time   First MD Initiated Contact with Patient 04/29/20 1440     (approximate)  I have reviewed the triage vital signs and the nursing notes.   HISTORY  Chief Complaint Respiratory Distress (Syncope/) and Chest Pain (/)   HPI Edwin [name] is a 50 y.o. male with a past medical history of CAD with multiple prior stents and CHF with an EF of 15 to 20% status post AICD, HTN, HDL, DM, ESRD on HD most recently dialyzed 1/28, gout, depression, paroxysmal A. fib anticoagulant Eliquis and rate controlled on metoprolol as well as rhythm controlled on amiodarone and recent admission discharged on 1/26 for Covid diagnosed 1/24 and unresponsiveness and seizure-like activities versus cardiogenic syncope with unremarkable work-up who presents via EMS from home for assessment of acute onset of shortness of breath and some substernal chest pain associate with multiple syncopal episodes.  However per EMS patient syncopized several times in route lasting less than 30 seconds.  States she does not remember these episodes.  He has not had any other recent injuries or falls.  He has not had any headache, earache, sore throat, vomiting, diarrhea, dysuria, rash, abdominal pain, back pain or joint pain or other acute complaints.  Denies tobacco abuse or EtOH use or illicit drug use.  He has been taking all his medicines as directed since being discharged.         Past Medical History:  Diagnosis Date  . CAD (coronary artery disease)   . Chronic systolic heart failure ([place])   . Diabetes mellitus without complication ([place])   . Heart attack ([place])   . Hyperlipidemia   . Hypertension   . Ischemic cardiomyopathy   . Paroxysmal atrial fibrillation ([name])   . Renal disorder     Patient Active Problem List   Diagnosis Date Noted  . Recurrent episodes of unresponsiveness 04/23/2020   . Dilated cardiomyopathy ([place]) 04/23/2020  . OSA (obstructive sleep apnea) 04/23/2020  . Syncope 04/12/2020  . Elevated troponin 04/12/2020  . Atrial fibrillation, chronic ([place]) 04/12/2020  . GERD (gastroesophageal reflux disease) 04/12/2020  . Gout 04/12/2020  . Depression 04/12/2020  . Obesity, Class III, BMI 40-49.9 (morbid obesity) ([place])   . Chronic systolic CHF (congestive heart failure) ([place])   . ESRD (end stage renal disease) ([place]) 01/23/2020  . CAD (coronary artery disease) 01/23/2020  . Type 2 diabetes mellitus with ESRD (end-stage renal disease) ([place]) 01/23/2020  . Essential hypertension 01/23/2020  . Hyperlipidemia 01/23/2020  . Atrial fibrillation with rapid ventricular response ([place]) 01/23/2020  . Syncope and collapse 10/28/2018  . Chest pain 07/08/2018  . NSTEMI (non-ST elevated myocardial infarction) ([place]) 03/08/2018  . Infection of left knee ([place]) 12/16/2017    Past Surgical History:  Procedure Laterality Date  . [employer] pacemaker    . CARDIAC SURGERY    . IRRIGATION AND DEBRIDEMENT KNEE Left 12/17/2017   Procedure: LEFT KNEE DEBRIDEMENT AND SYNOVECTOMY;  Surgeon: Thornton Park, MD;  Location: [facility] ORS;  Service: Orthopedics;  Laterality: Left;  . JOINT REPLACEMENT    . LEFT HEART CATH AND CORONARY ANGIOGRAPHY Left 03/09/2018   Procedure: LEFT HEART CATH AND CORONARY ANGIOGRAPHY;  Surgeon: Nelva Bush, MD;  Location: [place] [facility];  Service: Cardiovascular;  Laterality: Left;  . LEFT HEART CATH AND CORONARY ANGIOGRAPHY N/A 01/25/2020   Procedure: LEFT HEART CATH AND CORONARY ANGIOGRAPHY;  Surgeon: Isaias Cowman, MD;  Location: [address]  CV LAB;  Service: Cardiovascular;  Laterality: N/A;  . PERCUTANEOUS CORONARY STENT INTERVENTION (PCI-S)      Prior to Admission medications   Medication Sig Start Date End Date Taking? Authorizing Provider  acetaminophen (TYLENOL) 500 MG tablet Take 500-1,000 mg by mouth every 6 (six) hours as  needed for mild pain, moderate pain or fever.    [provider]  albuterol (PROVENTIL HFA;VENTOLIN HFA) 108 (90 Base) MCG/ACT inhaler Inhale 2 puffs into the lungs every 6 (six) hours as needed for wheezing or shortness of breath. 04/29/18   Schuyler Amor, MD  Alirocumab 150 MG/ML SOAJ Inject 150 mg as directed every 14 (fourteen) days.    [provider]  allopurinol (ZYLOPRIM) 100 MG tablet Take 200 mg by mouth daily. 03/18/18   [provider]  amiodarone (PACERONE) 200 MG tablet Take 1 tablet (200 mg total) by mouth 2 (two) times daily. 04/13/20   Shawna Clamp, MD  apixaban (ELIQUIS) 5 MG TABS tablet Take 5 mg by mouth 2 (two) times daily.    [provider]  atorvastatin (LIPITOR) 80 MG tablet Take 80 mg by mouth every evening.  09/14/17   [provider]  bumetanide (BUMEX) 2 MG tablet Take 2 mg by mouth 2 (two) times daily. 12/06/19   [provider]  cholecalciferol (VITAMIN D) 1000 units tablet Take 1,000 Units by mouth daily.    [provider]  ezetimibe (ZETIA) 10 MG tablet Take 10 mg by mouth daily. 10/14/17   [provider]  FLUoxetine (PROZAC) 40 MG capsule Take 80 mg by mouth daily. 12/04/17   [provider]  gabapentin (NEURONTIN) 100 MG capsule Take 1 capsule (100 mg total) by mouth 2 (two) times daily. 04/25/20   Aline August, MD  insulin detemir (LEVEMIR) 100 UNIT/ML injection Inject 0.1 mLs (10 Units total) into the skin at bedtime. 04/25/20   Aline August, MD  isosorbide mononitrate (IMDUR) 30 MG 24 hr tablet Take 2 tablets (60 mg total) by mouth daily. Do not take on dialysis days 04/25/20   Aline August, MD  loperamide (IMODIUM) 2 MG capsule Take 2 mg by mouth as needed for diarrhea or loose stools.    [provider]  metoprolol succinate (TOPROL-XL) 100 MG 24 hr tablet Take 100 mg by mouth daily. 01/03/20   [provider]  mirtazapine (REMERON) 7.5 MG tablet Take 7.5 mg by  mouth at bedtime.    [provider]  Multiple Vitamin (MULTIVITAMIN WITH MINERALS) TABS tablet Take 1 tablet by mouth daily. 10/31/18   Vaughan Basta, MD  nitroGLYCERIN (NITROLINGUAL) 0.4 MG/SPRAY spray Place 1 spray under the tongue every 5 (five) minutes x 3 doses as needed for chest pain.    [provider]  omeprazole (PRILOSEC) 40 MG capsule Take 40 mg by mouth 2 (two) times daily. 11/25/19   [provider]  ondansetron (ZOFRAN) 4 MG tablet Take 4 mg by mouth every 12 (twelve) hours as needed for nausea. 01/04/20   [provider]  ranolazine (RANEXA) 500 MG 12 hr tablet Take 500 mg by mouth 2 (two) times daily.    [provider]  RENVELA 800 MG tablet Take 2,400 mg by mouth 3 (three) times daily with meals. 10/14/17   [provider]  Semaglutide,0.25 or 0.'5MG'$ /DOS, (OZEMPIC, 0.25 OR 0.5 MG/DOSE,) 2 MG/1.5ML SOPN Inject 0.5 mg into the skin once a week.    [provider]  ticagrelor (BRILINTA) 90 MG TABS tablet Take  90 mg by mouth 2 (two) times daily.    [provider]  VOLTAREN 1 % GEL Apply 2 g topically 3 (three) times daily as needed (left leg pain).  12/04/17   [provider]    Allergies Losartan, Morphine, and Morphine and related  Family History  Problem Relation Age of Onset  . Diabetes Mother   . Peripheral vascular disease Mother   . COPD Father     Social History Social History   Tobacco Use  . 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 Smokeless tobacco: Current User    Types: Snuff  Vaping Use  . Vaping Use: Never used  Substance Use Topics  . Alcohol use: Never  . Drug use: Never    Review of Systems  Review of Systems  Constitutional: Positive for malaise/fatigue. Negative for chills and fever.  HENT: Negative for sore throat.   Eyes: Negative for pain.  Respiratory: Positive for cough and shortness of breath. Negative for stridor.   Cardiovascular: Positive for chest pain  and palpitations.  Gastrointestinal: Positive for nausea. Negative for vomiting.  Genitourinary: Negative for dysuria.  Musculoskeletal: Negative for myalgias.  Skin: Negative for rash.  Neurological: Positive for loss of consciousness. Negative for seizures and headaches.  Psychiatric/Behavioral: Negative for suicidal ideas.  All other systems reviewed and are negative.     ____________________________________________   PHYSICAL EXAM:  VITAL SIGNS: ED Triage Vitals  Enc Vitals Group     BP      Pulse      Resp      Temp      Temp src      SpO2      Weight      Height      Head Circumference      Peak Flow      Pain Score      Pain Loc      Pain Edu?      Excl. in [place]?    Vitals:   04/29/20 1530 04/29/20 1545  BP: (!) 154/122 138/89  Pulse: 81 83  Resp: 12 17  SpO2: 100% 100%   Physical Exam Vitals and nursing note reviewed.  Constitutional:      Appearance: He is well-developed and well-nourished. He is obese. He is ill-appearing.  HENT:     Head: Normocephalic and atraumatic.     Right Ear: External ear normal.     Left Ear: External ear normal.     Nose: Nose normal.     Mouth/Throat:     Mouth: Mucous membranes are moist.  Eyes:     Conjunctiva/sclera: Conjunctivae normal.  Cardiovascular:     Rate and Rhythm: Normal rate and regular rhythm.     Heart sounds: No murmur heard.   Pulmonary:     Effort: Pulmonary effort is normal. No respiratory distress.     Breath sounds: Decreased breath sounds present.  Abdominal:     Palpations: Abdomen is soft.     Tenderness: There is no abdominal tenderness.  Musculoskeletal:        General: No edema.     Cervical back: Neck supple.     Right lower leg: No edema.     Left lower leg: No edema.  Skin:    General: Skin is warm and dry.     Capillary Refill: Capillary refill takes less than 2 seconds.  Neurological:     Mental Status: He is alert and oriented to person, place, and time.  Psychiatric:         Mood and Affect: Mood and affect and mood normal.     PERRLA.  EOMI.  Patient is symmetric strength in all extremities.  Sensation intact light touch of all extremities. ____________________________________________   LABS (all labs ordered are listed, but only abnormal results are displayed)  Labs Reviewed  BLOOD GAS, VENOUS - Abnormal; Notable for the following components:      Result Value   pCO2, Ven 40 (*)    Acid-base deficit 5.1 (*)    All other components within normal limits  CBC WITH DIFFERENTIAL/PLATELET - Abnormal; Notable for the following components:   RBC 4.10 (*)    Hemoglobin 11.5 (*)    HCT 36.1 (*)    RDW 15.9 (*)    All other components within normal limits  COMPREHENSIVE METABOLIC PANEL - Abnormal; Notable for the following components:   CO2 19 (*)    Glucose, Bld 244 (*)    BUN 27 (*)    Creatinine, Ser 6.42 (*)    Calcium 8.2 (*)    Albumin 2.9 (*)    GFR, Estimated 10 (*)    All other components within normal limits  BRAIN NATRIURETIC PEPTIDE - Abnormal; Notable for the following components:   B Natriuretic Peptide 574.2 (*)    All other components within normal limits  LACTIC ACID, PLASMA - Abnormal; Notable for the following components:   Lactic Acid, Venous 2.1 (*)    All other components within normal limits  TROPONIN I (HIGH SENSITIVITY) - Abnormal; Notable for the following components:   Troponin I (High Sensitivity) 31 (*)    All other components within normal limits  PROCALCITONIN  LACTIC ACID, PLASMA  FIBRIN DERIVATIVES D-DIMER ([facility] ONLY)   ____________________________________________  EKG  Sinus rhythm with a ventricular rate of 99, right bundle branch block, left anterior fascicle block, diffuse T wave changes in the anterior and inferior leads. ____________________________________________  RADIOLOGY  ED MD interpretation: Bilateral edema improved from last x-ray and some cardiomegaly.  No focal consolidation, thorax, widened  mediastinum, or other clear acute thoracic process.  Official radiology report(s): DG Chest 1 View  Result Date: 04/29/2020 CLINICAL DATA:  Syncope 2 EXAM: CHEST  1 VIEW COMPARISON:  04/23/2020 FINDINGS: Stable large cardiac silhouette. There is mild central venous congestion improved from prior. Low lung volumes. No pneumothorax. Large dialysis catheter noted. IMPRESSION: 1. Improvement in venous congestion pattern. 2. Cardiomegaly. Electronically Signed   By: Suzy Bouchard M.D.   On: 04/29/2020 15:16    ____________________________________________   PROCEDURES  Procedure(s) performed (including Critical Care):  .1-3 Lead EKG Interpretation Performed by: Lucrezia Starch, MD Authorized by: Lucrezia Starch, MD     Interpretation: abnormal     ECG rate assessment: normal     Rhythm: sinus rhythm     Ectopy: none     Conduction: abnormal     Abnormal conduction comment:  RBBB     ____________________________________________   INITIAL IMPRESSION / ASSESSMENT AND PLAN / ED COURSE        Patient presents with above-stated history exam for assessment of some shortness of breath and chest discomfort in the setting of recently diagnosed with COVID-19 on 1/24 hours consultation for syncopal episodes versus seizure-like episodes.  On arrival patient is afebrile hemodynamically stable.  He is awake and alert endorses some chest pain shortness of breath but denies any other acute sick symptoms.  Unclear etiology for multiple syncopal episodes reported per  EMS although there is a broad differential including but not limited to ACS, arrhythmia, PE, dissection, SAH, CVA, metabolic derangement, symptomatic anemia, seizure disorder and possible acute cardiomyopathy.  Did review patient's recent hospitalization and he was extensively evaluated by neurology.  Seems he has had these episodes of seizure-like activity going back almost a year and prior work-ups were concerning for possible  syncope related to A. fib with RVR felt less likely to be seizures.  And neurology notes.  Differential also included cardiogenic syncope versus syncopal convulsion and possible nonepileptic seizures as patient has had depression increased stress of last couple days.   VBG obtained shows no evidence of hypercarbic respiratory failure.  CBC shows no leukocytosis and hemoglobin at baseline with a hemoglobin today of 11.5 compared to 10.34 days ago.  CMP remarkable for hyperglycemia with a glucose of 244 and bicarb of 19 with BUN/creatinine consistent with patient's known history of ESRD and no significant electrolyte or metabolic derangements.  Troponin is slightly elevated at 31 although this is down from 4 days ago it was noted to be 46.  I will plan to trend this level lower suspicion for ACS at this time.   Care patient signed over to oncoming rider approximately 1500.  Plan is to follow-up remaining labs and reassess.  Patient will likely require admission for further evaluation management of concern for possible cardiogenic syncope.  I did discuss patient's presentation and work-up with on-call neurologist Dr. Malen Gauze who said he was very familiar with the patient and noted the patient had been evaluated extensively and admitted several times a very similar presentations it did not correct recommend Keppra or LTM at this time.  ____________________________________________   FINAL CLINICAL IMPRESSION(S) / ED DIAGNOSES  Final diagnoses:  Chest pain, unspecified type  SOB (shortness of breath)  COVID  Syncope, unspecified syncope type    Medications  LORazepam (ATIVAN) 2 MG/ML injection (has no administration in time range)     ED Discharge Orders    None       Note:  This document was prepared using Dragon voice recognition software and [place] include unintentional dictation errors.   Lucrezia Starch, MD 04/29/20 [phone]

## 2020-04-29 NOTE — ED Notes (Signed)
CBG 151.

## 2020-04-29 NOTE — ED Notes (Signed)
Pt denies needs currently, is alert and oriented x4. Skin normal color warm and dry. Cardiac monitoring in place, seizure pads in place, siderails  Up bed in low and locked position. Call bell at left side. CVC dialysis cath with intact dressing to right chest. Suction, ambu bag set up at bedside. Room cleaned. Pt denies nausea, but complains of "I still just have that bad taste in my mouth".

## 2020-04-29 NOTE — ED Notes (Signed)
Informed provider of lactic acid of 2.1. Dr Ellender Hose at bedside. Pt states has Chiropractor. Pt is alert and oriented times 4.

## 2020-04-29 NOTE — ED Notes (Signed)
Pt speaking on phone with wife. Pt does not want information given to anyone else except wife Edwin [name]. Confirmed phone numbers in chart. Edwin [name] would like EDP to call her once more information is know about plan of care.

## 2020-04-29 NOTE — ED Notes (Signed)
Spoke with Dr Blaine Hamper re: this nurse's concern about lowering pt's BG too rapidly as last CBG was 151 and initial CBG was 282, especially with recent seizures. Provider agrees that holding insulin at this time is correct. Informed attending that second EKG was performed and pacemaker interrogation was performed. Dr Blaine Hamper states he will be rounding on pt shortly.

## 2020-04-29 NOTE — ED Notes (Signed)
Waiting on levemir from pharmacy.

## 2020-04-29 NOTE — ED Triage Notes (Addendum)
Pt to ED from home via [facility] c/o SOB, syncope. Pt has had several syncopal episodes this week, including 4d ago at dialysis. Pt missed dialysis yesterday. Pt lost consciousness 3 times on way to hospital with EMS. Pt RR was 30s-40s and when lost consciousness RR was around 20. Pt began having chest pain today when EMS arrived. Pt in respiratory distress, having SOB. Covid diagnosis was 1 week ago. Bloodwork being obtained. Pt now became unresponsive again, appearing to have absence seizure, then began having tonic-clonic seizure. EDP at bedside. CBG is 282. Seizure pads placed. Pt reoriented to place and situation.

## 2020-04-29 NOTE — ED Notes (Signed)
Pt taken to CT at 1410. Pt had witnessed absence seizure while in CT. This nurse called by CT staff. Arrived, pt responding appropriately with clear speech. Pt had 2 more generalized seizures after CT scan, at 1415. '1mg'$  ativan given IV. CN aware. EDP aware. Pt back in room, obtaining VS.

## 2020-04-29 NOTE — ED Notes (Signed)
Pt alert, eating dinner tray.

## 2020-04-29 NOTE — ED Notes (Signed)
Pt has implanted pacemaker/defribrillator.  Pacemaker interrogation performed per verbal order from Dr Ellender Hose.

## 2020-04-29 NOTE — ED Provider Notes (Signed)
50 year old male with extensive past medical history including chronic ischemic cardiomyopathy, end-stage renal disease, recent admission for syncope and diagnosis of COVID here with shortness of breath and syncopal episodes.  Patient also reporting generalized weakness.  Lab work today shows mild lactic acid elevation, slight BNP elevation.  Troponin is negative.  Interrogation received.  Interestingly, the patient appears to be going into atrial tachycardia, suspected afib, with AR>250. I suspect his episodes Tomer be related to losing his atrial kick during these episodes. VR remains 80-90s during episodes. Admit to medicine with plan for Cards consult.    Duffy Bruce, MD 04/29/20 [phone]

## 2020-04-29 NOTE — H&P (Signed)
History and Physical    Edwin [name] [id number] DOB: 1971-01-06 DOA: 04/29/2020  Referring MD/NP/PA:   PCP: Caryl Never, MD   Patient coming from:  The patient is coming from home.  At baseline, pt is independent for most of ADL.        Chief Complaint: Syncope  HPI: Edwin [name] is a 50 y.o. male with medical history significant of hypertension, hyperlipidemia, diabetes mellitus, GERD, gout, depression, atrial fibrillation on Eliquis, sCHF with EF 15-20% (by recent cardiac cath on 03/26/2020), CAD, ESRD-HD (TTS), [employer] pacemaker placement, COVID-19 infection 04/23/2020, who presents with syncope.  Pt was recently hospitalized from 1/24-1/26 due to cardiogenic syncope and seizure-like activity.  Neurology was consulted, did not think patient has seizure.  EEG was normal.  Pseudoseizure was in differential diagnosis.  Patient states that he has had multiple episodes of syncope with seizure-like activity this week.  Patient had short lasting unresponsiveness in ED.  Currently patient is alert and oriented x3.  No unilateral numbness or tingling in his extremities.  No facial droop or slurred speech. Patient states that he has dry cough, shortness of breath with little yellow-colored sputum production.  No fever or chills.  Patient had chest pain intermittently recently, currently no chest pain.  Patient denies nausea, vomiting, diarrhea, abdominal pain, symptoms of UTI.  Patient missed dialysis yesterday.  He states that he had dialysis on Friday.  Patient states that he has been taking Eliquis consistently, last dose was yesterday.  Pacemaker interrogation was performed in ED. Per ED physician, "the patient appears to be going into atrial tachycardia, suspected afib, with AR>250. VR remains 80-90s during episodes".    ED Course: pt was found to have troponin 31, BNP 574, lactic acid 2.1, potassium 4.6, BUN 19, creatinine 6.42, BUN 27, blood pressure 138/89, heart rate 90,  83, RR 29, 17, oxygen saturation 93-92% on room air (patient was supported on 2 L oxygen for comfort), chest x-ray showed cardiomegaly without infiltration.  CT head negative. Patient is placed on progressive bed for observation.  Dr. Oval Linsey of cardiology is consulted   Review of Systems:   General: no fevers, chills, no body weight gain, has fatigue HEENT: no blurry vision, hearing changes or sore throat Respiratory: has dyspnea, coughing, no wheezing CV: no chest pain, no palpitations GI: no nausea, vomiting, abdominal pain, diarrhea, constipation GU: no dysuria, burning on urination, increased urinary frequency, hematuria  Ext: has trace leg edema Neuro: no unilateral weakness, numbness, or tingling, no vision change or hearing loss. Has syncope Skin: no rash, no skin tear. MSK: No muscle spasm, no deformity, no limitation of range of movement in spin Heme: No easy bruising.  Travel history: No recent long distant travel.  Allergy:  Allergies  Allergen Reactions  . Losartan     Hyperkalemia (K>6)  . Morphine Anxiety  . Morphine And Related Other (See Comments)    Overdose in hospital per patient report    Past Medical History:  Diagnosis Date  . CAD (coronary artery disease)   . Chronic systolic heart failure ([place])   . Diabetes mellitus without complication ([place])   . Heart attack ([place])   . Hyperlipidemia   . Hypertension   . Ischemic cardiomyopathy   . Paroxysmal atrial fibrillation ([name])   . Renal disorder     Past Surgical History:  Procedure Laterality Date  . [employer] pacemaker    . CARDIAC SURGERY    . IRRIGATION AND DEBRIDEMENT KNEE  Left 12/17/2017   Procedure: LEFT KNEE DEBRIDEMENT AND SYNOVECTOMY;  Surgeon: Thornton Park, MD;  Location: [facility];  Service: Orthopedics;  Laterality: Left;  . JOINT REPLACEMENT    . LEFT HEART CATH AND CORONARY ANGIOGRAPHY Left 03/09/2018   Procedure: LEFT HEART CATH AND CORONARY ANGIOGRAPHY;  Surgeon: Nelva Bush, MD;  Location: [facility];  Service: Cardiovascular;  Laterality: Left;  . LEFT HEART CATH AND CORONARY ANGIOGRAPHY N/A 01/25/2020   Procedure: LEFT HEART CATH AND CORONARY ANGIOGRAPHY;  Surgeon: Isaias Cowman, MD;  Location: [facility];  Service: Cardiovascular;  Laterality: N/A;  . PERCUTANEOUS CORONARY STENT INTERVENTION (PCI-S)      Social History:  reports that he has quit smoking. His smokeless tobacco use includes snuff. He reports that he does not drink alcohol and does not use drugs.  Family History:  Family History  Problem Relation Age of Onset  . Diabetes Mother   . Peripheral vascular disease Mother   . COPD Father      Prior to Admission medications   Medication Sig Start Date End Date Taking? Authorizing Provider  acetaminophen (TYLENOL) 500 MG tablet Take 500-1,000 mg by mouth every 6 (six) hours as needed for mild pain, moderate pain or fever.    [provider]  albuterol (PROVENTIL HFA;VENTOLIN HFA) 108 (90 Base) MCG/ACT inhaler Inhale 2 puffs into the lungs every 6 (six) hours as needed for wheezing or shortness of breath. 04/29/18   Schuyler Amor, MD  Alirocumab 150 MG/ML SOAJ Inject 150 mg as directed every 14 (fourteen) days.    [provider]  allopurinol (ZYLOPRIM) 100 MG tablet Take 200 mg by mouth daily. 03/18/18   [provider]  amiodarone (PACERONE) 200 MG tablet Take 1 tablet (200 mg total) by mouth 2 (two) times daily. 04/13/20   Shawna Clamp, MD  apixaban (ELIQUIS) 5 MG TABS tablet Take 5 mg by mouth 2 (two) times daily.    [provider]  atorvastatin (LIPITOR) 80 MG tablet Take 80 mg by mouth every evening.  09/14/17   [provider]  bumetanide (BUMEX) 2 MG tablet Take 2 mg by mouth 2 (two) times daily. 12/06/19   [provider]  cholecalciferol (VITAMIN D) 1000 units tablet Take 1,000 Units by mouth daily.    [provider]  ezetimibe (ZETIA) 10  MG tablet Take 10 mg by mouth daily. 10/14/17   [provider]  FLUoxetine (PROZAC) 40 MG capsule Take 80 mg by mouth daily. 12/04/17   [provider]  gabapentin (NEURONTIN) 100 MG capsule Take 1 capsule (100 mg total) by mouth 2 (two) times daily. 04/25/20   Aline August, MD  insulin detemir (LEVEMIR) 100 UNIT/ML injection Inject 0.1 mLs (10 Units total) into the skin at bedtime. 04/25/20   Aline August, MD  isosorbide mononitrate (IMDUR) 30 MG 24 hr tablet Take 2 tablets (60 mg total) by mouth daily. Do not take on dialysis days 04/25/20   Aline August, MD  loperamide (IMODIUM) 2 MG capsule Take 2 mg by mouth as needed for diarrhea or loose stools.    [provider]  metoprolol succinate (TOPROL-XL) 100 MG 24 hr tablet Take 100 mg by mouth daily. 01/03/20   [provider]  mirtazapine (REMERON) 7.5 MG tablet Take 7.5 mg by mouth at bedtime.    [provider]  Multiple Vitamin (MULTIVITAMIN WITH MINERALS) TABS tablet Take 1 tablet by mouth daily. 10/31/18   Vaughan Basta, MD  nitroGLYCERIN (NITROLINGUAL) 0.4 MG/SPRAY spray Place 1 spray under the tongue every 5 (five) minutes x 3 doses as needed for chest pain.    [provider]  omeprazole (PRILOSEC) 40 MG capsule Take 40 mg by mouth 2 (two) times daily. 11/25/19   [provider]  ondansetron (ZOFRAN) 4 MG tablet Take 4 mg by mouth every 12 (twelve) hours as needed for nausea. 01/04/20   [provider]  ranolazine (RANEXA) 500 MG 12 hr tablet Take 500 mg by mouth 2 (two) times daily.    [provider]  RENVELA 800 MG tablet Take 2,400 mg by mouth 3 (three) times daily with meals. 10/14/17   [provider]  Semaglutide,0.25 or 0.'5MG'$ /DOS, (OZEMPIC, 0.25 OR 0.5 MG/DOSE,) 2 MG/1.5ML SOPN Inject 0.5 mg into the skin once a week.    [provider]  ticagrelor (BRILINTA) 90 MG TABS tablet Take 90 mg by mouth 2 (two) times daily.    [provider]  VOLTAREN 1 % GEL Apply 2 g topically 3 (three) times daily as needed (left leg pain).  12/04/17   [provider]    Physical Exam: Vitals:   04/29/20 1715 04/29/20 1730 04/29/20 1745 04/29/20 1800  BP: (!) 144/87 125/71 130/76 (!) 139/116  Pulse: 81 81 80 90  Resp: (!) 21 (!) '21 19 13  '$ SpO2: 100% 100% 100% 100%  Weight:      Height:       General: Not in acute distress HEENT:       Eyes: PERRL, EOMI, no scleral icterus.       ENT: No discharge from the ears and nose, no pharynx injection, no tonsillar enlargement.        Neck: No JVD, no bruit, no mass felt. Heme: No neck lymph node enlargement. Cardiac: S1/S2, RRR, No murmurs, No gallops or rubs. Respiratory: No rales, wheezing, rhonchi or rubs. GI: Soft, nondistended, nontender, no rebound pain, no organomegaly, BS present. GU: No hematuria Ext: has trace leg edema bilaterally. 1+DP/PT pulse bilaterally. Musculoskeletal: No joint deformities, No joint redness or warmth, no limitation of ROM in spin. Skin: No rashes.  Neuro: Alert, oriented X3, cranial nerves II-XII grossly intact, moves all extremities normally.  Psych: Patient is not psychotic, no suicidal or hemocidal ideation.  Labs on Admission: I have personally reviewed following labs and imaging studies  CBC: Recent Labs  Lab 04/23/20 0752 04/24/20 0429 04/25/20 0458 04/29/20 1442  WBC 3.8* 5.0 5.5 5.8  NEUTROABS  --   --   --  4.2  HGB 10.6* 9.9* 10.3* 11.5*  HCT 34.1* 30.7* 32.1* 36.1*  MCV 89.0 89.0 88.2 88.0  PLT 112* 149* 171 [id number]   Basic Metabolic Panel: Recent Labs  Lab 04/23/20 0752 04/24/20 0429 04/25/20 0458 04/29/20 1442  NA 137 135 136 137  K 4.3 4.7 4.1 4.6  CL 102 102 101 103  CO2 23 21* 23 19*  GLUCOSE 216* 169* 130* 244*  BUN 27* 35* 25* 27*  CREATININE 5.68* 6.45* 5.30* 6.42*  CALCIUM 8.3* 7.6* 7.4* 8.2*   GFR: Estimated Creatinine Clearance: 22.2 mL/min (A) (by C-G formula based on SCr of 6.42 mg/dL  (H)). Liver Function Tests: Recent Labs  Lab 04/23/20 0752 04/29/20 1442  AST 24 23  ALT 16 11  ALKPHOS 75 97  BILITOT 0.5 0.6  PROT 6.7 7.9  ALBUMIN 2.6* 2.9*   No results for input(s): LIPASE, AMYLASE in the last 168 hours. No results for input(s):  AMMONIA in the last 168 hours. Coagulation Profile: No results for input(s): INR, PROTIME in the last 168 hours. Cardiac Enzymes: No results for input(s): CKTOTAL, CKMB, CKMBINDEX, TROPONINI in the last 168 hours. BNP (last 3 results) No results for input(s): PROBNP in the last 8760 hours. HbA1C: No results for input(s): HGBA1C in the last 72 hours. CBG: Recent Labs  Lab 04/25/20 0308 04/25/20 0755 04/25/20 1229 04/29/20 1440 04/29/20 1652  GLUCAP 154* 115* 166* 212* 151*   Lipid Profile: No results for input(s): CHOL, HDL, LDLCALC, TRIG, CHOLHDL, LDLDIRECT in the last 72 hours. Thyroid Function Tests: No results for input(s): TSH, T4TOTAL, FREET4, T3FREE, THYROIDAB in the last 72 hours. Anemia Panel: No results for input(s): VITAMINB12, FOLATE, FERRITIN, TIBC, IRON, RETICCTPCT in the last 72 hours. Urine analysis:    Component Value Date/Time   COLORURINE STRAW (A) 10/28/2018 2145   APPEARANCEUR CLEAR (A) 10/28/2018 2145   LABSPEC 1.008 10/28/2018 2145   PHURINE 5.0 10/28/2018 2145   GLUCOSEU 50 (A) 10/28/2018 2145   HGBUR SMALL (A) 10/28/2018 2145   BILIRUBINUR NEGATIVE 10/28/2018 2145   KETONESUR NEGATIVE 10/28/2018 2145   PROTEINUR 100 (A) 10/28/2018 2145   NITRITE NEGATIVE 10/28/2018 2145   LEUKOCYTESUR NEGATIVE 10/28/2018 2145   Sepsis Labs: '@LABRCNTIP'$ (procalcitonin:4,lacticidven:4) ) Recent Results (from the past 240 hour(s))  SARS Coronavirus 2 by RT PCR (hospital order, performed in [facility] hospital lab) Nasopharyngeal Nasopharyngeal Swab     Status: Abnormal   Collection Time: 04/23/20  8:15 AM   Specimen: Nasopharyngeal Swab  Result Value Ref Range Status   SARS Coronavirus 2 POSITIVE (A)  NEGATIVE Final    Comment: RESULT CALLED TO, READ BACK BY AND VERIFIED WITH: SAMANTHA HAMILTON ON 04/23/20 AT 25 QSD (NOTE) SARS-CoV-2 target nucleic acids are DETECTED  SARS-CoV-2 RNA is generally detectable in upper respiratory specimens  during the acute phase of infection.  Positive results are indicative  of the presence of the identified virus, but do not rule out bacterial infection or co-infection with other pathogens not detected by the test.  Clinical correlation with patient history and  other diagnostic information is necessary to determine patient infection status.  The expected result is negative.  Fact Sheet for Patients:   StrictlyIdeas.no   Fact Sheet for Healthcare Providers:   BankingDealers.co.za    This test is not yet approved or cleared by the [place] FDA and  has been authorized for detection and/or diagnosis of SARS-CoV-2 by FDA under an Emergency Use Authorization (EUA).  This EUA will remain in effect (meaning t his test can be used) for the duration of  the COVID-19 declaration under Section 564(b)(1) of the Act, 21 U.S.C. section 360-bbb-3(b)(1), unless the authorization is terminated or revoked sooner.  Performed at [facility], [address], [place], [place] [address]      Radiological Exams on Admission: DG Chest 1 View  Result Date: 04/29/2020 CLINICAL DATA:  Syncope 2 EXAM: CHEST  1 VIEW COMPARISON:  04/23/2020 FINDINGS: Stable large cardiac silhouette. There is mild central venous congestion improved from prior. Low lung volumes. No pneumothorax. Large dialysis catheter noted. IMPRESSION: 1. Improvement in venous congestion pattern. 2. Cardiomegaly. Electronically Signed   By: Suzy Bouchard M.D.   On: 04/29/2020 15:16   CT Head Wo Contrast  Result Date: 04/29/2020 CLINICAL DATA:  Mental status change.  Syncope EXAM: CT HEAD WITHOUT CONTRAST TECHNIQUE: Contiguous axial images  were obtained from the base of the skull through the vertex without intravenous  contrast. COMPARISON:  CT head 04/23/2020 FINDINGS: Brain: No evidence of acute infarction, hemorrhage, hydrocephalus, extra-axial collection or mass lesion/mass effect. Vascular: Negative for hyperdense vessel Skull: Negative Sinuses/Orbits: Paranasal sinuses clear.  Negative orbit Other: Motion degraded study IMPRESSION: Negative CT head Electronically Signed   By: Franchot Gallo M.D.   On: 04/29/2020 16:33     EKG: I have personally reviewed.  Seems to be sinus rhythm, QTC 523, mild ST depression in V4-V6, T wave inversion in lateral leads.  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  Pacemaker interrogation showed atrial tachycardia, suspected afib, with AR>250. VR remains 80-90s during episodes.  Patient has very low EF 15--20%, with atrial tachycardia, patient Zayas have lost atrial kick during these episodes.   CT head negative.  Patient does not have focal neuro deficit.  Low suspicions for stroke.  Patient is taking Eliquis for atrial fibrillation, actually patient is also taking Xarelto at the same time, low suspicions of for PE.  Dr. Oval Linsey of cardiology is consulted  -Placed on progressive bed of observation -Check orthostatic vital sign -Frequent neuro check -Telemetry monitor -Patient is on metoprolol and amiodarone for A. fib  ESRD (end stage renal disease) (TTS) -Dr. Theador Hawthorne of renal is consulted for dialysis  CAD (coronary artery disease) and elevated  troponin: Troponin 31.  Patient has baseline elevated troponin, recent baseline 32-87.  Patient states that he had some intermittent chest pain recently, but not today. -Continue Lipitor, Zetia, imdur, metoprolol, Brilinta, ranolazine -As needed nitroglycerin  Type 2 diabetes mellitus with ESRD (end-stage renal disease) ([place]): Patient is taking Victoza, NovoLog, Ozempic, Levemir -Sliding scale insulin -Decrease Levemir dose from 10 to 7 units daily  Essential hypertension -IV hydralazine as needed -Patient is on amlodipine, metoprolol -Patient is also taking Coreg which will be discontinued since patient is taking metoprolol,  Hyperlipidemia -Lipitor, Zetia -pt is also on Alirocumab  Chronic systolic CHF (congestive heart failure) ([place]): Recent cardiac cath showed EF 15-20% on 04/27/2019.  Patient has trace leg edema, no oxygen desaturation.  Chest x-ray did not show pulmonary edema.  CHF seem to be compensated. -Volume management per renal by dialysis  Atrial fibrillation, chronic ([name]) -Continue Eliquis -Patient is also taking Xarelto, likely due to mistake, will discontinue Xarelto -Continue metoprolol and amiodarone -Discontinue Coreg  GERD (gastroesophageal reflux disease) -Protonix  Gout -Allopurinol  Depression -Hold Prozac, Remeron, trazodone due to QTC prolongation  COVID-19 virus infection: Patient had positive Covid test on 1/24.  Patient has shortness of breath, which is likely multifactorial etiology including low EF CHF, ESRD.  Patient does not have oxygen desaturation.  Chest x-ray did not show infiltration.  Does not need specific treatment -As needed albuterol and Mucinex     DVT ppx: on Eliquis Code Status: Full code Family Communication: not done, no family member is at bed side.  Disposition Plan:  Anticipate discharge back to previous environment Consults called:  Dr. Oval Linsey of cardiology Admission status and Level of care: Progressive Cardiac for  obs    Status is: Observation  The  patient remains OBS appropriate and will d/c before 2 midnights.  Dispo: The patient is from: Home              Anticipated d/c is to: Home              Anticipated d/c date is: 1 day              Patient currently is not medically stable to d/c.   Difficult to place patient No        Date of Service 04/29/2020    [facility] [facility]   If 7PM-7AM, please contact night-coverage [email or web address] 04/29/2020, 6:14 PM

## 2020-04-29 NOTE — ED Notes (Signed)
Pt speaking on video call with daughter and grandchild. Pt provided with ice chips a little while ago. Axillary T checked and found to be 97.9.

## 2020-04-29 NOTE — ED Notes (Signed)
Pt requests oxygen for comfort because he feels like he "can't breathe". SPO2 is 98% on RA. Applied 2L oxygen per [place] for comfort.

## 2020-04-29 NOTE — Progress Notes (Signed)
Edwin [name]  MRN: [id number]  DOB/AGE: 10-16-1970 50 y.o.  Primary Care Physician:Gladman, Okey Dupre, MD  Admit date: 04/29/2020  Chief Complaint:  Chief Complaint  Patient presents with  . Respiratory Distress    Syncope   . Chest Pain         S-Pt presented on  04/29/2020 with  Chief Complaint  Patient presents with  . Respiratory Distress    Syncope   . Chest Pain       . Patient is a 50 year old male with a past medical history of coronary artery disease, CHF with ejection fraction of 15 to 20%, status post AICD, hypertension, ESRD on hemodialysis, gout, depression, atrial fibrillation, recent diagnosis of Covid who was brought to the ER with chief complaint of syncope.  History of present illness dates back to today when EMS was called as per the data from EMS patient had repeated episodes of loss of consciousness/syncope/questionable absence seizures. Upon evaluation in the ER AICD was interrogated and patient was found to have atrial rate of more than 250 Patient had another episode of absence seizure breath syncope while undergoing CT scan  Nephrology was consulted Patient seen in the ER Patient does complain of intermittent shortness of breath and correlates it with a gritty sensation in the mouth Patient offers no complaint of chest pain No complaint of fever/cough/chills No complaint of hematuria No complaint of abdominal pain   Medications . insulin aspart  0-5 Units Subcutaneous QHS  . insulin aspart  0-9 Units Subcutaneous TID WC         ROS: Patient offers no specific complaints, all systems were reviewed     Physical Exam: Vital signs in last 24 hours: Pulse Rate:  [81-90] 82 (01/30 1630) Resp:  [12-29] 14 (01/30 1630) BP: (126-154)/(82-122) 126/92 (01/30 1630) SpO2:  [93 %-100 %] 100 % (01/30 1630) Weight:  [165 kg] 165 kg (01/30 1453) Weight change:     Intake/Output from previous day: No intake/output data recorded. No  intake/output data recorded.   Physical Exam:  General- pt is awake,alert, oriented to time place and person  Resp- No acute REsp distress, CTA B/L NO Rhonchi  CVS- S1S2 regular in rate and rhythm  GIT- BS+, soft, Non tender , Non distended  EXT- No LE Edema,  No Cyanosis  Access-  tunneled cath in situ  Lab Results:  CBC  Recent Labs    04/29/20 1442  WBC 5.8  HGB 11.5*  HCT 36.1*  PLT 270    BMET  Recent Labs    04/29/20 1442  NA 137  K 4.6  CL 103  CO2 19*  GLUCOSE 244*  BUN 27*  CREATININE 6.42*  CALCIUM 8.2*      Most recent Creatinine trend  Lab Results  Component Value Date   CREATININE 6.42 (H) 04/29/2020   CREATININE 5.30 (H) 04/25/2020   CREATININE 6.45 (H) 04/24/2020      MICRO   Recent Results (from the past 240 hour(s))  SARS Coronavirus 2 by RT PCR (hospital order, performed in [facility] hospital lab) Nasopharyngeal Nasopharyngeal Swab     Status: Abnormal   Collection Time: 04/23/20  8:15 AM   Specimen: Nasopharyngeal Swab  Result Value Ref Range Status   SARS Coronavirus 2 POSITIVE (A) NEGATIVE Final    Comment: RESULT CALLED TO, READ BACK BY AND VERIFIED WITH: SAMANTHA HAMILTON ON 04/23/20 AT 1052 QSD (NOTE) SARS-CoV-2 target nucleic acids are DETECTED  SARS-CoV-2 RNA is generally detectable  in upper respiratory specimens  during the acute phase of infection.  Positive results are indicative  of the presence of the identified virus, but do not rule out bacterial infection or co-infection with other pathogens not detected by the test.  Clinical correlation with patient history and  other diagnostic information is necessary to determine patient infection status.  The expected result is negative.  Fact Sheet for Patients:   StrictlyIdeas.no   Fact Sheet for Healthcare Providers:   BankingDealers.co.za    This test is not yet approved or cleared by the [place] FDA and   has been authorized for detection and/or diagnosis of SARS-CoV-2 by FDA under an Emergency Use Authorization (EUA).  This EUA will remain in effect (meaning t his test can be used) for the duration of  the COVID-19 declaration under Section 564(b)(1) of the Act, 21 U.S.C. section 360-bbb-3(b)(1), unless the authorization is terminated or revoked sooner.  Performed at [facility], [address] [place], [place] [address]      CXR done on 1.30.22 FINDINGS: Stable large cardiac silhouette. There is mild central venous congestion improved from prior. Low lung volumes. No pneumothorax. Large dialysis catheter noted.  IMPRESSION: 1. Improvement in venous congestion pattern. 2. Cardiomegaly.   Impression:   [name] is a 50 y.o.  male with end stage renal disease on hemodialysis, hypertension, hyperlipidemia, diabetes mellitus type II, atrial fibrillation, coronary artery disease, systolic congestive heart failure, gout, depression who is admitted to [facility] on 04/23/2020 for Recurrent episodes of unresponsiveness, was now admitted again with episodes of repeated syncope.  [facility]phrology [name] [facility] permcath   1)Renal    ESRD Patient is on hemodialysis Patient is a Monday Wednesday Friday schedule as an outpatient We will dialyze patient as an inpatient-most likely on on Tuesday Thursday Saturday if no acute need, as patient has recent Covid diagnosis NO acute need for HD tonight    2)HTN    Blood pressure is stable    3)Anemia of chronic disease  CBC Latest Ref Rng & Units 04/29/2020 04/25/2020 04/24/2020  WBC 4.0 - 10.5 K/uL 5.8 5.5 5.0  Hemoglobin 13.0 - 17.0 g/dL 11.5(L) 10.3(L) 9.9(L)  Hematocrit 39.0 - 52.0 % 36.1(L) 32.1(L) 30.7(L)  Platelets 150 - 400 K/uL 270 171 149(L)       HGb at goal (9--11)   4) Secondary hyperparathyroidism -CKD Mineral-Bone Disorder    Lab Results  Component Value Date   PTH 22 12/20/2017    CALCIUM 8.2 (L) 04/29/2020   PHOS 4.6 01/25/2020    Secondary Hyperparathyroidism present   Blood  Ref Range & Units 1 yr ago  PTH 12 - 72 pg/mL 114.0High   Calcium 8.5 - 10.2 mg/dL 9.2   Resulting Agency  [facility]  Specimen Collected: 08/12/18 3:38 PM Last Resulted: 08/12/18 9:50 PM  Received From: [place]  Result Received: 10/28/18 2:29 PM       Phosphorus at goal.   5)COVID-19 Primary team is following   6) Electrolytes   BMP Latest Ref Rng & Units 04/29/2020 04/25/2020 04/24/2020  Glucose 70 - 99 mg/dL 244(H) 130(H) 169(H)  BUN 6 - 20 mg/dL 27(H) 25(H) 35(H)  Creatinine 0.61 - 1.24 mg/dL 6.42(H) 5.30(H) 6.45(H)  Sodium 135 - 145 mmol/L 137 136 135  Potassium 3.5 - 5.1 mmol/L 4.6 4.1 4.7  Chloride 98 - 111 mmol/L 103 101 102  CO2 22 - 32 mmol/L 19(L) 23 21(L)  Calcium  8.9 - 10.3 mg/dL 8.2(L) 7.4(L) 7.6(L)     Sodium Normonatremic   Potassium Normokalemic    7)Acid base  137-122=15  Delta AG 15-9=6  Delta Bicarb 24-19=5  Lactic acidosis Results for NACHUM, BEHR (MRN [id number]) as of 04/29/2020 17:26  Ref. Range 04/29/2020 14:43  Lactic Acid, Venous Latest Ref Range: 0.5 - 1.9 mmol/L 2.1 (HH)    Co2 is not  at goal   8) syncope Cardiac versus neurological  Primary team is following      Plan:  No acute need for renal replacement therapy today We will dialyze pt in the morning We will hold off on giving Epogen as patient hemoglobin is more than 11      Chaunice Obie s Leilanie Rauda 04/29/2020, 5:10 PM

## 2020-04-30 ENCOUNTER — Ambulatory Visit: Admit: 2020-04-30 | Discharge: 2020-05-01 | Payer: MEDICARE

## 2020-04-30 ENCOUNTER — Encounter: Payer: Self-pay | Admitting: Internal Medicine

## 2020-04-30 DIAGNOSIS — N2581 Secondary hyperparathyroidism of renal origin: Secondary | ICD-10-CM | POA: Diagnosis present

## 2020-04-30 DIAGNOSIS — I2510 Atherosclerotic heart disease of native coronary artery without angina pectoris: Secondary | ICD-10-CM | POA: Diagnosis present

## 2020-04-30 DIAGNOSIS — Z833 Family history of diabetes mellitus: Secondary | ICD-10-CM | POA: Diagnosis not present

## 2020-04-30 DIAGNOSIS — R55 Syncope and collapse: Secondary | ICD-10-CM

## 2020-04-30 DIAGNOSIS — F32A Depression, unspecified: Secondary | ICD-10-CM | POA: Diagnosis present

## 2020-04-30 DIAGNOSIS — Z720 Tobacco use: Secondary | ICD-10-CM | POA: Diagnosis not present

## 2020-04-30 DIAGNOSIS — Z6841 Body Mass Index (BMI) 40.0 and over, adult: Secondary | ICD-10-CM | POA: Diagnosis not present

## 2020-04-30 DIAGNOSIS — U071 COVID-19: Secondary | ICD-10-CM | POA: Diagnosis present

## 2020-04-30 DIAGNOSIS — I5022 Chronic systolic (congestive) heart failure: Secondary | ICD-10-CM | POA: Diagnosis present

## 2020-04-30 DIAGNOSIS — I471 Supraventricular tachycardia: Secondary | ICD-10-CM | POA: Diagnosis present

## 2020-04-30 DIAGNOSIS — Z950 Presence of cardiac pacemaker: Secondary | ICD-10-CM | POA: Diagnosis not present

## 2020-04-30 DIAGNOSIS — G4733 Obstructive sleep apnea (adult) (pediatric): Secondary | ICD-10-CM | POA: Diagnosis present

## 2020-04-30 DIAGNOSIS — E785 Hyperlipidemia, unspecified: Secondary | ICD-10-CM | POA: Diagnosis present

## 2020-04-30 DIAGNOSIS — I252 Old myocardial infarction: Secondary | ICD-10-CM | POA: Diagnosis not present

## 2020-04-30 DIAGNOSIS — I482 Chronic atrial fibrillation, unspecified: Secondary | ICD-10-CM | POA: Diagnosis present

## 2020-04-30 DIAGNOSIS — E1165 Type 2 diabetes mellitus with hyperglycemia: Secondary | ICD-10-CM | POA: Diagnosis present

## 2020-04-30 DIAGNOSIS — R569 Unspecified convulsions: Secondary | ICD-10-CM | POA: Diagnosis present

## 2020-04-30 DIAGNOSIS — Z8249 Family history of ischemic heart disease and other diseases of the circulatory system: Secondary | ICD-10-CM | POA: Diagnosis not present

## 2020-04-30 DIAGNOSIS — M109 Gout, unspecified: Secondary | ICD-10-CM | POA: Diagnosis present

## 2020-04-30 DIAGNOSIS — Z992 Dependence on renal dialysis: Secondary | ICD-10-CM | POA: Diagnosis not present

## 2020-04-30 DIAGNOSIS — R778 Other specified abnormalities of plasma proteins: Secondary | ICD-10-CM | POA: Diagnosis not present

## 2020-04-30 DIAGNOSIS — N186 End stage renal disease: Secondary | ICD-10-CM | POA: Diagnosis present

## 2020-04-30 DIAGNOSIS — K219 Gastro-esophageal reflux disease without esophagitis: Secondary | ICD-10-CM | POA: Diagnosis present

## 2020-04-30 DIAGNOSIS — Z825 Family history of asthma and other chronic lower respiratory diseases: Secondary | ICD-10-CM | POA: Diagnosis not present

## 2020-04-30 DIAGNOSIS — I132 Hypertensive heart and chronic kidney disease with heart failure and with stage 5 chronic kidney disease, or end stage renal disease: Secondary | ICD-10-CM | POA: Diagnosis present

## 2020-04-30 LAB — MAGNESIUM: Magnesium: 2 mg/dL (ref 1.7–2.4)

## 2020-04-30 LAB — BASIC METABOLIC PANEL
Anion gap: 13 (ref 5–15)
BUN: 27 mg/dL — ABNORMAL HIGH (ref 6–20)
CO2: 18 mmol/L — ABNORMAL LOW (ref 22–32)
Calcium: 8 mg/dL — ABNORMAL LOW (ref 8.9–10.3)
Chloride: 106 mmol/L (ref 98–111)
Creatinine, Ser: 7.03 mg/dL — ABNORMAL HIGH (ref 0.61–1.24)
GFR, Estimated: 9 mL/min — ABNORMAL LOW (ref >60)
Glucose, Bld: 154 mg/dL — ABNORMAL HIGH (ref 70–99)
Potassium: 4.4 mmol/L (ref 3.5–5.1)
Sodium: 137 mmol/L (ref 135–145)

## 2020-04-30 LAB — TROPONIN I (HIGH SENSITIVITY)
Troponin I (High Sensitivity): 34 ng/L — ABNORMAL HIGH (ref <18)
Troponin I (High Sensitivity): 37 ng/L — ABNORMAL HIGH (ref <18)

## 2020-04-30 LAB — CBC
HCT: 38.8 % — ABNORMAL LOW (ref 39.0–52.0)
Hemoglobin: 11.9 g/dL — ABNORMAL LOW (ref 13.0–17.0)
MCH: 27.8 pg (ref 26.0–34.0)
MCHC: 30.7 g/dL (ref 30.0–36.0)
MCV: 90.7 fL (ref 80.0–100.0)
Platelets: 265 10*3/uL (ref 150–400)
RBC: 4.28 MIL/uL (ref 4.22–5.81)
RDW: 15.9 % — ABNORMAL HIGH (ref 11.5–15.5)
WBC: 7.5 10*3/uL (ref 4.0–10.5)
nRBC: 0 % (ref 0.0–0.2)

## 2020-04-30 LAB — GLUCOSE, CAPILLARY
Glucose-Capillary: 261 mg/dL — ABNORMAL HIGH (ref 70–99)
Glucose-Capillary: 174 mg/dL — ABNORMAL HIGH (ref 70–99)
Glucose-Capillary: 231 mg/dL — ABNORMAL HIGH (ref 70–99)

## 2020-04-30 LAB — CBG MONITORING, ED: Glucose-Capillary: 145 mg/dL — ABNORMAL HIGH (ref 70–99)

## 2020-04-30 MED ORDER — DILTIAZEM HCL 25 MG/5ML IV SOLN
5.0000 mg | Freq: Once | INTRAVENOUS | Status: AC
  Administered 2020-04-30: 5 mg via INTRAVENOUS
  Filled 2020-04-30: qty 5

## 2020-04-30 MED ORDER — FENTANYL CITRATE (PF) 100 MCG/2ML IJ SOLN
25.0000 ug | INTRAMUSCULAR | Status: DC | PRN
  Administered 2020-04-30: 25 ug via INTRAVENOUS
  Filled 2020-04-30: qty 2

## 2020-04-30 MED ORDER — PROMETHAZINE HCL 25 MG/ML IJ SOLN
12.5000 mg | INTRAMUSCULAR | Status: DC | PRN
  Administered 2020-04-30: 12.5 mg via INTRAVENOUS

## 2020-04-30 MED ORDER — MELATONIN 5 MG PO TABS
5.0000 mg | ORAL_TABLET | Freq: Every day | ORAL | Status: DC
  Administered 2020-04-30: 5 mg via ORAL
  Filled 2020-04-30: qty 1

## 2020-04-30 MED ORDER — FENTANYL CITRATE (PF) 100 MCG/2ML IJ SOLN
INTRAMUSCULAR | Status: AC
  Administered 2020-04-30: 25 ug via INTRAVENOUS
  Filled 2020-04-30: qty 2

## 2020-04-30 MED ORDER — ONDANSETRON HCL 4 MG/2ML IJ SOLN: 4.0000 mg | INTRAMUSCULAR | Status: DC | PRN

## 2020-04-30 NOTE — Progress Notes (Signed)
Patient ID: Edwin [name], male   DOB: [birth year], 50 y.o.   MRN: [id number]  PROGRESS NOTE    Edwin [name]  [id number] DOB: 10-Klann-1972 DOA: 04/29/2020 PCP: Caryl Never, MD   Brief Narrative:  50 y.o. male with medical history significant of hypertension, hyperlipidemia, diabetes mellitus, GERD, gout, depression, atrial fibrillation on Eliquis, sCHF with EF 15-20% (by recent cardiac cath on 03/26/2020), CAD, ESRD-HD (TTS), [employer] pacemaker placement, recent admission from 04/23/2020-04/25/2020 for unresponsiveness/seizure-like activity/cardiac syncope with work-up revealing normal EEG and brain imaging and neurology stating that this is not seizure and pseudoseizure was in differential diagnosis.  He had also tested positive for COVID-19 during the same admission but had no respiratory symptoms.  Patient presented on 04/30/2020 again with multiple episodes of syncope with seizure-like activity and also had short lasting unresponsiveness in ED.  Patient had missed his dialysis day prior to presentation.  On presentation, pacemaker interrogation was performed in the ED showing atrial rate of more than 250.  He was on room air and chest x-ray showed cardiomegaly without infiltrates.  CT head was negative.  Cardiology was consulted.  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  Pacemaker interrogation showed atrial tachycardia with atrial rate of more than 250 -Patient recently had similar presentation and had extensive work-up including neurology and cardiology evaluation.  He had a negative EEG and brain imaging and neurology thought that patient did not have seizure; pseudoseizures was not differential diagnosis.  Consult neurology: I have messaged Dr. Leonel Ramsay.  Will order EEG -Recent cardiac  cath showed EF of 15 to 20% on 04/27/2019.  Chest x-ray on presentation did not show pulmonary edema.  Awaiting cardiology evaluation.  No chest pain currently.  Troponins did not trend up.  Continue statin, Bumex, metoprolol succinate, isosorbide mononitrate, Brilinta and ranolazine -Fall precautions.  PT eval.  Continue neurochecks. -Monitor orthostatic vitals  End-stage renal disease on hemodialysis -Nephrology following.  Dialysis as per nephrology schedule  Essential hypertension -Monitor blood pressure.  Continue antihypertensives as above  Diabetes mellitus type 2 with hyperglycemia -Continue Levemir along with CBGs with SSI  Hyperlipidemia -Continue Lipitor and Zetia  Chronic A. Fib with intermittent RVR -Continue amiodarone, Eliquis and metoprolol.  Follow cardiology recommendations.  For some reason, patient was also taking Xarelto and Coreg.  Xarelto and Coreg have been discontinued.  GERD -Continue Protonix  Gout -Continue allopurinol  Depression -Prozac, Remeron and trazodone held due to QTC prolongation. -Repeat EKG in a.m.  COVID-19 infection -Patient tested positive on 04/23/2020.  Chest x-ray was negative for infiltrate.  Does not need any specific treatment.  Generalized conditioning -PT eval  OSA -Outpatient follow-up  Morbid obesity -Outpatient follow-up    DVT prophylaxis: Eliquis Code Status: Full Family Communication: None Disposition Plan: Status is: Observation  The patient will require care spanning > 2 midnights and should be moved to inpatient because: Inpatient level of care appropriate due to severity of illness.  Pending cardiology/neurology/PT eval.  Still complaining of nausea and poor oral intake  Dispo: The patient is from: Home              Anticipated d/c is to: Home              Anticipated d/c date is: 1 day              Patient  currently is not medically stable to d/c.   Difficult to place patient No  Consultants:  Cardiology.  Consult neurology  Procedures: None  Antimicrobials: None   Subjective: Patient seen and examined at bedside in the ED.  Poor historian.  Complains of some nausea and shortness of breath.  No witnessed seizure overnight or early this morning.  No chest pain, abdominal pain or diarrhea.  Nursing staff reports episodes of tachycardia.  Objective: Vitals:   04/30/20 0630 04/30/20 0645 04/30/20 0715 04/30/20 0945  BP: 130/78 128/81 (!) 122/102 128/80  Pulse:   (!) 106 85  Resp: (!) 22 (!) '21 20 19  '$ Temp:      TempSrc:      SpO2: 97% 96% 95% 95%  Weight:      Height:        Intake/Output Summary (Last 24 hours) at 04/30/2020 1004 Last data filed at 04/30/2020 0210 Gross per 24 hour  Intake -  Output 600 ml  Net -600 ml   Filed Weights   04/29/20 1453  Weight: (!) 165 kg    Examination:  General exam: Chronically ill looking.  In mild distress secondary to nausea.  Currently on room air. Respiratory system: Bilateral decreased breath sounds at bases with scattered crackles Cardiovascular system: S1 & S2 heard, intermittently tachycardic  gastrointestinal system: Abdomen is morbidly obese, slightly distended, soft and nontender. Normal bowel sounds heard. Extremities: No cyanosis, clubbing; trace lower extremity edema present Central nervous system: Awake and answers some questions.  Poor historian no focal neurological deficits. Moving extremities Skin: No rashes, lesions or ulcers Psychiatry: Flat affect   Data Reviewed: I have personally reviewed following labs and imaging studies  CBC: Recent Labs  Lab 04/24/20 0429 04/25/20 0458 04/29/20 1442 04/30/20 0535  WBC 5.0 5.5 5.8 7.5  NEUTROABS  --   --  4.2  --   HGB 9.9* 10.3* 11.5* 11.9*  HCT 30.7* 32.1* 36.1* 38.8*  MCV 89.0 88.2 88.0 90.7  PLT 149* 171 270 [id number]   Basic Metabolic Panel: Recent Labs  Lab 04/24/20 0429 04/25/20 0458 04/29/20 1442 04/30/20 0535  NA 135 136 137 137  K 4.7 4.1  4.6 4.4  CL 102 101 103 106  CO2 21* 23 19* 18*  GLUCOSE 169* 130* 244* 154*  BUN 35* 25* 27* 27*  CREATININE 6.45* 5.30* 6.42* 7.03*  CALCIUM 7.6* 7.4* 8.2* 8.0*  MG  --   --   --  2.0   GFR: Estimated Creatinine Clearance: 20.3 mL/min (A) (by C-G formula based on SCr of 7.03 mg/dL (H)). Liver Function Tests: Recent Labs  Lab 04/29/20 1442  AST 23  ALT 11  ALKPHOS 97  BILITOT 0.6  PROT 7.9  ALBUMIN 2.9*   No results for input(s): LIPASE, AMYLASE in the last 168 hours. No results for input(s): AMMONIA in the last 168 hours. Coagulation Profile: No results for input(s): INR, PROTIME in the last 168 hours. Cardiac Enzymes: No results for input(s): CKTOTAL, CKMB, CKMBINDEX, TROPONINI in the last 168 hours. BNP (last 3 results) No results for input(s): PROBNP in the last 8760 hours. HbA1C: No results for input(s): HGBA1C in the last 72 hours. CBG: Recent Labs  Lab 04/25/20 1229 04/29/20 1440 04/29/20 1652 04/29/20 2151 04/30/20 0830  GLUCAP 166* 212* 151* 145* 145*   Lipid Profile: No results for input(s): CHOL, HDL, LDLCALC, TRIG, CHOLHDL, LDLDIRECT in the last 72 hours. Thyroid Function Tests: No results for input(s): TSH, T4TOTAL, FREET4, T3FREE, THYROIDAB in  the last 72 hours. Anemia Panel: No results for input(s): VITAMINB12, FOLATE, FERRITIN, TIBC, IRON, RETICCTPCT in the last 72 hours. Sepsis Labs: Recent Labs  Lab 04/29/20 1442 04/29/20 1443 04/29/20 1643  PROCALCITON 0.13  --   --   LATICACIDVEN  --  2.1* 1.5    Recent Results (from the past 240 hour(s))  SARS Coronavirus 2 by RT PCR (hospital order, performed in [facility] hospital lab) Nasopharyngeal Nasopharyngeal Swab     Status: Abnormal   Collection Time: 04/23/20  8:15 AM   Specimen: Nasopharyngeal Swab  Result Value Ref Range Status   SARS Coronavirus 2 POSITIVE (A) NEGATIVE Final    Comment: RESULT CALLED TO, READ BACK BY AND VERIFIED WITH: SAMANTHA HAMILTON ON 04/23/20 AT 76  QSD (NOTE) SARS-CoV-2 target nucleic acids are DETECTED  SARS-CoV-2 RNA is generally detectable in upper respiratory specimens  during the acute phase of infection.  Positive results are indicative  of the presence of the identified virus, but do not rule out bacterial infection or co-infection with other pathogens not detected by the test.  Clinical correlation with patient history and  other diagnostic information is necessary to determine patient infection status.  The expected result is negative.  Fact Sheet for Patients:   StrictlyIdeas.no   Fact Sheet for Healthcare Providers:   BankingDealers.co.za    This test is not yet approved or cleared by the [place] FDA and  has been authorized for detection and/or diagnosis of SARS-CoV-2 by FDA under an Emergency Use Authorization (EUA).  This EUA will remain in effect (meaning t his test can be used) for the duration of  the COVID-19 declaration under Section 564(b)(1) of the Act, 21 U.S.C. section 360-bbb-3(b)(1), unless the authorization is terminated or revoked sooner.  Performed at [facility], [address], [place], [place] [address]          Radiology Studies: DG Chest 1 View  Result Date: 04/29/2020 CLINICAL DATA:  Syncope 2 EXAM: CHEST  1 VIEW COMPARISON:  04/23/2020 FINDINGS: Stable large cardiac silhouette. There is mild central venous congestion improved from prior. Low lung volumes. No pneumothorax. Large dialysis catheter noted. IMPRESSION: 1. Improvement in venous congestion pattern. 2. Cardiomegaly. Electronically Signed   By: Suzy Bouchard M.D.   On: 04/29/2020 15:16   CT Head Wo Contrast  Result Date: 04/29/2020 CLINICAL DATA:  Mental status change.  Syncope EXAM: CT HEAD WITHOUT CONTRAST TECHNIQUE: Contiguous axial images were obtained from the base of the skull through the vertex without intravenous contrast. COMPARISON:  CT head 04/23/2020  FINDINGS: Brain: No evidence of acute infarction, hemorrhage, hydrocephalus, extra-axial collection or mass lesion/mass effect. Vascular: Negative for hyperdense vessel Skull: Negative Sinuses/Orbits: Paranasal sinuses clear.  Negative orbit Other: Motion degraded study IMPRESSION: Negative CT head Electronically Signed   By: Franchot Gallo M.D.   On: 04/29/2020 16:33        Scheduled Meds: . allopurinol  200 mg Oral Daily  . amiodarone  200 mg Oral BID  . amLODipine  10 mg Oral Daily  . apixaban  5 mg Oral BID  . atorvastatin  80 mg Oral QPM  . bumetanide  2 mg Oral BID  . Chlorhexidine Gluconate Cloth  6 each Topical Q0600  . cholecalciferol  1,000 Units Oral Daily  . ezetimibe  10 mg Oral Daily  . gabapentin  100 mg Oral BID  . hydrOXYzine  10 mg Oral Daily  . insulin aspart  0-5 Units Subcutaneous QHS  .  insulin aspart  0-9 Units Subcutaneous TID WC  . insulin detemir  7 Units Subcutaneous QHS  . isosorbide mononitrate  60 mg Oral Once per day on Sun Mon Wed Fri  . metoprolol succinate  100 mg Oral Daily  . multivitamin with minerals  1 tablet Oral Daily  . pantoprazole  40 mg Oral Daily  . ranolazine  500 mg Oral BID  . sevelamer carbonate  2,400 mg Oral TID WC  . ticagrelor  90 mg Oral BID   Continuous Infusions:        Aline August, MD [facility] 04/30/2020, 10:04 AM

## 2020-04-30 NOTE — Progress Notes (Signed)
[place], [place] 04/30/20  Subjective:   LOS: 0 Hospital course: Patient recently admitted on January 24.  He was brought in by EMS from dialysis for hypertension, became unresponsive and had altered mental status.  He possibly had a seizure, V. tach and lost pulses.  CPR was performed   Sitting up in the chair.  Talking on the phone.  Denies any acute complaints No shortness of breath or leg edema  Objective:  Vital signs in last 24 hours:  Temp:  [97.5 F (36.4 C)-97.9 F (36.6 C)] 97.5 F (36.4 C) (01/31 1242) Pulse Rate:  [54-106] 81 (01/31 1242) Resp:  [10-30] 20 (01/31 1242) BP: (97-174)/(59-116) 105/59 (01/31 1242) SpO2:  [89 %-100 %] 100 % (01/31 1424)  Weight change:  Filed Weights   04/29/20 1453  Weight: (!) 165 kg    Intake/Output:    Intake/Output Summary (Last 24 hours) at 04/30/2020 1543 Last data filed at 04/30/2020 0210 Gross per 24 hour  Intake --  Output 600 ml  Net -600 ml    Physical Exam: General:  No acute distress,   HEENT  anicteric, moist oral mucous membrane  Pulm/lungs  normal breathing effort, lungs are clear to auscultation  CVS/Heart  regular rhythm, no rub or gallop  Abdomen:   Soft, nontender  Extremities:  No peripheral edema  Neurologic:  Alert, oriented, able to follow commands  Skin:  No acute rashes  Right IJ PermCath   Basic Metabolic Panel:  Recent Labs  Lab 04/24/20 0429 04/25/20 0458 04/29/20 1442 04/30/20 0535  NA 135 136 137 137  K 4.7 4.1 4.6 4.4  CL 102 101 103 106  CO2 21* 23 19* 18*  GLUCOSE 169* 130* 244* 154*  BUN 35* 25* 27* 27*  CREATININE 6.45* 5.30* 6.42* 7.03*  CALCIUM 7.6* 7.4* 8.2* 8.0*  MG  --   --   --  2.0     CBC: Recent Labs  Lab 04/24/20 0429 04/25/20 0458 04/29/20 1442 04/30/20 0535  WBC 5.0 5.5 5.8 7.5  NEUTROABS  --   --  4.2  --   HGB 9.9* 10.3* 11.5* 11.9*  HCT 30.7* 32.1* 36.1* 38.8*  MCV 89.0 88.2 88.0 90.7  PLT 149* 171 270 265       Lab Results  Component Value Date   HEPBSAG NON REACTIVE 04/25/2020      Microbiology:  Recent Results (from the past 240 hour(s))  SARS Coronavirus 2 by RT PCR (hospital order, performed in [facility] hospital lab) Nasopharyngeal Nasopharyngeal Swab     Status: Abnormal   Collection Time: 04/23/20  8:15 AM   Specimen: Nasopharyngeal Swab  Result Value Ref Range Status   SARS Coronavirus 2 POSITIVE (A) NEGATIVE Final    Comment: RESULT CALLED TO, READ BACK BY AND VERIFIED WITH: SAMANTHA HAMILTON ON 04/23/20 AT 88 QSD (NOTE) SARS-CoV-2 target nucleic acids are DETECTED  SARS-CoV-2 RNA is generally detectable in upper respiratory specimens  during the acute phase of infection.  Positive results are indicative  of the presence of the identified virus, but do not rule out bacterial infection or co-infection with other pathogens not detected by the test.  Clinical correlation with patient history and  other diagnostic information is necessary to determine patient infection status.  The expected result is negative.  Fact Sheet for Patients:   StrictlyIdeas.no   Fact Sheet for Healthcare Providers:   BankingDealers.co.za    This test is not yet approved or cleared by  the [employer] and  has been authorized for detection and/or diagnosis of SARS-CoV-2 by FDA under an Emergency Use Authorization (EUA).  This EUA will remain in effect (meaning t his test can be used) for the duration of  the COVID-19 declaration under Section 564(b)(1) of the Act, 21 U.S.C. section 360-bbb-3(b)(1), unless the authorization is terminated or revoked sooner.  Performed at [facility], [address] [place], [place] [address]     Coagulation Studies: No results for input(s): LABPROT, INR in the last 72 hours.  Urinalysis: No results for input(s): COLORURINE, LABSPEC, PHURINE, GLUCOSEU, HGBUR, BILIRUBINUR, KETONESUR, PROTEINUR,  UROBILINOGEN, NITRITE, LEUKOCYTESUR in the last 72 hours.  Invalid input(s): APPERANCEUR    Imaging: DG Chest 1 View  Result Date: 04/29/2020 CLINICAL DATA:  Syncope 2 EXAM: CHEST  1 VIEW COMPARISON:  04/23/2020 FINDINGS: Stable large cardiac silhouette. There is mild central venous congestion improved from prior. Low lung volumes. No pneumothorax. Large dialysis catheter noted. IMPRESSION: 1. Improvement in venous congestion pattern. 2. Cardiomegaly. Electronically Signed   By: Suzy Bouchard M.D.   On: 04/29/2020 15:16   CT Head Wo Contrast  Result Date: 04/29/2020 CLINICAL DATA:  Mental status change.  Syncope EXAM: CT HEAD WITHOUT CONTRAST TECHNIQUE: Contiguous axial images were obtained from the base of the skull through the vertex without intravenous contrast. COMPARISON:  CT head 04/23/2020 FINDINGS: Brain: No evidence of acute infarction, hemorrhage, hydrocephalus, extra-axial collection or mass lesion/mass effect. Vascular: Negative for hyperdense vessel Skull: Negative Sinuses/Orbits: Paranasal sinuses clear.  Negative orbit Other: Motion degraded study IMPRESSION: Negative CT head Electronically Signed   By: Franchot Gallo M.D.   On: 04/29/2020 16:33     Medications:    . allopurinol  200 mg Oral Daily  . amiodarone  200 mg Oral BID  . amLODipine  10 mg Oral Daily  . apixaban  5 mg Oral BID  . atorvastatin  80 mg Oral QPM  . bumetanide  2 mg Oral BID  . Chlorhexidine Gluconate Cloth  6 each Topical Q0600  . cholecalciferol  1,000 Units Oral Daily  . ezetimibe  10 mg Oral Daily  . gabapentin  100 mg Oral BID  . hydrOXYzine  10 mg Oral Daily  . insulin aspart  0-5 Units Subcutaneous QHS  . insulin aspart  0-9 Units Subcutaneous TID WC  . insulin detemir  7 Units Subcutaneous QHS  . isosorbide mononitrate  60 mg Oral Once per day on Sun Mon Wed Fri  . metoprolol succinate  100 mg Oral Daily  . multivitamin with minerals  1 tablet Oral Daily  . pantoprazole  40 mg Oral  Daily  . ranolazine  500 mg Oral BID  . sevelamer carbonate  2,400 mg Oral TID WC  . ticagrelor  90 mg Oral BID   acetaminophen, albuterol, dextromethorphan-guaiFENesin, diclofenac Sodium, hydrALAZINE, nitroGLYCERIN, ondansetron (ZOFRAN) IV, ondansetron, promethazine  Assessment/ Plan:  50 y.o. male with end stage renal disease on hemodialysis, hypertension, hyperlipidemia, diabetes mellitus type II, atrial fibrillation, coronary artery disease, systolic congestive heart failure, gout, depression   was admitted on 04/29/2020 for  Principal Problem:   Syncope Active Problems:   ESRD (end stage renal disease) ([name])   CAD (coronary artery disease)   Type 2 diabetes mellitus with ESRD (end-stage renal disease) ([place])   Essential hypertension   Hyperlipidemia   Chronic systolic CHF (congestive heart failure) ([name])   Elevated troponin   Atrial fibrillation, chronic ([name])   GERD (gastroesophageal reflux  disease)   Gout   Depression   COVID-19 virus infection  Syncope [R55] SOB (shortness of breath) [R06.02] Syncope, unspecified syncope type [R55] Chest pain, unspecified type [R07.9] COVID [U07.1]    [facility] [name] [facility] RIJ permcath  #. ESRD Plan for hemodialysis on TTS Covid shift while in hospital   #. Anemia of CKD  Lab Results  Component Value Date   HGB 11.9 (L) 04/30/2020   Low dose EPO with HD for hemoglobin less than 10  #. Secondary hyperparathyroidism of renal origin N 25.81      Component Value Date/Time   PTH 22 12/20/2017 0840   Lab Results  Component Value Date   PHOS 4.6 01/25/2020   Monitor calcium and phos level during this admission   #. Diabetes type 2 with CKD Hgb A1c MFr Bld (%)  Date Value  04/12/2020 8.1 (H)  Insulin-dependent, Levemir  #COVID-19 infection Diagnosed April 23, 2020  #Syncope Cardiac evaluation ongoing for arrhythmia - atrial fibrillation/flutter.  Rate control with metoprolol amiodarone.  High  intensity cholesterol therapy   LOS: 0 Rashell Shambaugh Candiss Norse 1/31/20223:43 PM  [facility] [place], [place]

## 2020-04-30 NOTE — ED Notes (Signed)
Pt showing EKG changes on monitor, EKG captured and presented to [facility] MD for concern.

## 2020-04-30 NOTE — ED Notes (Signed)
Pt sleeping, resps unlabored. Call bell at left side, seizure precautions remain in place.

## 2020-04-30 NOTE — Consult Note (Signed)
[facility] Consultation Note  Patient ID: Edwin [name], MRN: [id number], DOB/AGE: 52/24/1972 52 y.o. Admit date: 04/29/2020   Date of Consult: 04/30/2020 Primary Physician: Caryl Never, MD Primary Cardiologist: [facility]  Chief Complaint:  Chief Complaint  Patient presents with  . Respiratory Distress    Syncope   . Chest Pain        Reason for Consult: HPI    Respiratory Distress     Additional comments: Syncope           Chest Pain     Additional comments:         Last edited by Charlotte Crumb, RN on 04/29/2020  2:45 PM. (History)    Possible syncope atrial fibrillation and atrial flutter cardiomyopathy  HPI: 50 y.o. male with known dilated cardiomyopathy with ejection fraction of 20% chronic kidney disease with dialysis hypertension hyperlipidemia coronary artery disease diabetes who has had paroxysmal nonvalvular atrial fibrillation for which she has been on appropriate medication management including amiodarone Eliquis and metoprolol.  The patient has had reasonable control but has had episodes of breakthrough atrial fibrillation by history and has had an EKG upon admission this hospitalization suggesting atrial flutter with 2-1 block.  The patient currently has a reasonable heart rate control and telemetry suggesting normal sinus rhythm at this time.  The patient also has tolerated dialysis fairly well with no evidence of significant anginal symptoms or possibility of cardiovascular issues.  Chest x-ray has shown no evidence of significant pulmonary edema and patient does have a BNP of 574 a troponin of 31.  The patient claims that he has been more short of breath with his Covid infection which [place] need some treatment with oxygenation and other supportive care.  Previously he is reported episodes of syncope for which she has had full work-up of a brain scan with no evidence of ischemic changes, stroke, carotid atherosclerosis.  Additionally EEG was performed  showing no evidence of seizure.  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  During our interview the patient had a pseudoseizure type event for which he said he was short of breath and then did not respond to our conversation.  There was no evidence of seizure type activity.  Heart rate and blood pressure stayed exactly stable with excellent oxygenation of 100%.  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  Currently the patient does claim to be somewhat short of breath but no evidence of hypoxia or other issues at this time  Past Medical History:  Diagnosis Date  . CAD (coronary artery disease)   . Chronic systolic heart failure ([place])   . Diabetes mellitus without complication ([place])   . Heart attack ([place])   . Hyperlipidemia   . Hypertension   . Ischemic cardiomyopathy   . Paroxysmal atrial fibrillation ([name])   . Renal disorder       Surgical History:  Past Surgical History:  Procedure Laterality Date  . [employer] pacemaker    . CARDIAC SURGERY    . IRRIGATION AND DEBRIDEMENT KNEE Left 12/17/2017   Procedure: LEFT KNEE DEBRIDEMENT AND SYNOVECTOMY;  Surgeon: Thornton Park, MD;  Location: [facility] ORS;  Service: Orthopedics;  Laterality: Left;  . JOINT REPLACEMENT    . LEFT HEART CATH AND CORONARY ANGIOGRAPHY Left 03/09/2018   Procedure: LEFT HEART CATH AND CORONARY ANGIOGRAPHY;  Surgeon: Nelva Bush, MD;  Location: [place] [facility];  Service: Cardiovascular;  Laterality: Left;  . LEFT HEART CATH  AND CORONARY ANGIOGRAPHY N/A 01/25/2020   Procedure: LEFT HEART CATH AND CORONARY ANGIOGRAPHY;  Surgeon: Isaias Cowman, MD;  Location: [place] [facility];  Service: Cardiovascular;  Laterality: N/A;  . PERCUTANEOUS CORONARY STENT INTERVENTION (PCI-S)       Home Meds: Prior to Admission medications   Medication Sig Start Date End Date Taking? Authorizing Provider  allopurinol (ZYLOPRIM) 100 MG tablet Take 200 mg by  mouth daily. 03/18/18  Yes [provider]  amiodarone (PACERONE) 200 MG tablet Take 1 tablet (200 mg total) by mouth 2 (two) times daily. 04/13/20  Yes Shawna Clamp, MD  amLODipine (NORVASC) 10 MG tablet Take 10 mg by mouth daily. 07/26/19  Yes [provider]  apixaban (ELIQUIS) 5 MG TABS tablet Take 5 mg by mouth 2 (two) times daily.   Yes [provider]  atorvastatin (LIPITOR) 80 MG tablet Take 80 mg by mouth every evening.  09/14/17  Yes [provider]  bumetanide (BUMEX) 2 MG tablet Take 2 mg by mouth 2 (two) times daily. 12/06/19  Yes [provider]  carvedilol (COREG) 25 MG tablet Take 25 mg by mouth daily. 07/26/19  Yes [provider]  cholecalciferol (VITAMIN D) 1000 units tablet Take 1,000 Units by mouth daily.   Yes [provider]  empagliflozin (JARDIANCE) 10 MG TABS tablet Take 10 mg by mouth daily. 03/12/20  Yes [provider]  ezetimibe (ZETIA) 10 MG tablet Take 10 mg by mouth daily. 10/14/17  Yes [provider]  FLUoxetine (PROZAC) 40 MG capsule Take 80 mg by mouth daily. 12/04/17  Yes [provider]  gabapentin (NEURONTIN) 100 MG capsule Take 1 capsule (100 mg total) by mouth 2 (two) times daily. 04/25/20  Yes Aline August, MD  hydrOXYzine (ATARAX/VISTARIL) 10 MG tablet Take 10 mg by mouth daily. 07/26/19  Yes [provider]  insulin aspart (NOVOLOG FLEXPEN) 100 UNIT/ML FlexPen Inject 22 Units into the skin in the morning. 07/26/19  Yes [provider]  insulin detemir (LEVEMIR) 100 UNIT/ML injection Inject 0.1 mLs (10 Units total) into the skin at bedtime. 04/25/20  Yes Aline August, MD  isosorbide mononitrate (IMDUR) 30 MG 24 hr tablet Take 2 tablets (60 mg total) by mouth daily. Do not take on dialysis days 04/25/20  Yes Aline August, MD  liraglutide (VICTOZA) 18 MG/3ML SOPN Inject 0.6 mg into the skin once a week. 07/26/19  Yes [provider]  metoprolol  succinate (TOPROL-XL) 100 MG 24 hr tablet Take 100 mg by mouth daily. 01/03/20  Yes [provider]  mirtazapine (REMERON) 7.5 MG tablet Take 7.5 mg by mouth at bedtime.   Yes [provider]  Multiple Vitamin (MULTIVITAMIN WITH MINERALS) TABS tablet Take 1 tablet by mouth daily. 10/31/18  Yes Vaughan Basta, MD  omeprazole (PRILOSEC) 40 MG capsule Take 40 mg by mouth 2 (two) times daily. 11/25/19  Yes [provider]  ranolazine (RANEXA) 500 MG 12 hr tablet Take 500 mg by mouth 2 (two) times daily.   Yes [provider]  RENVELA 800 MG tablet Take 2,400 mg by mouth 3 (three) times daily with meals. 10/14/17  Yes [provider]  Rivaroxaban (XARELTO) 15 MG TABS tablet Take 15 mg by mouth daily. 07/26/19  Yes [provider]  Semaglutide,0.25 or 0.'5MG'$ /DOS, (OZEMPIC, 0.25 OR 0.5 MG/DOSE,) 2 MG/1.5ML SOPN Inject 0.5 mg into the skin once a week.   Yes [provider]  sevelamer (RENAGEL) 800 MG tablet Take 800 mg by  mouth daily. 03/20/20  Yes [provider]  ticagrelor (BRILINTA) 90 MG TABS tablet Take 90 mg by mouth 2 (two) times daily.   Yes [provider]  traZODone (DESYREL) 50 MG tablet Take 50 mg by mouth daily. 07/26/19  Yes [provider]  acetaminophen (TYLENOL) 500 MG tablet Take 500-1,000 mg by mouth every 6 (six) hours as needed for mild pain, moderate pain or fever.    [provider]  albuterol (PROVENTIL HFA;VENTOLIN HFA) 108 (90 Base) MCG/ACT inhaler Inhale 2 puffs into the lungs every 6 (six) hours as needed for wheezing or shortness of breath. 04/29/18   Schuyler Amor, MD  Alirocumab 150 MG/ML SOAJ Inject 150 mg as directed every 14 (fourteen) days.    [provider]  loperamide (IMODIUM) 2 MG capsule Take 2 mg by mouth as needed for diarrhea or loose stools.    [provider]  nitroGLYCERIN (NITROLINGUAL) 0.4 MG/SPRAY spray Place 1 spray under the tongue every 5  (five) minutes x 3 doses as needed for chest pain.    [provider]  ondansetron (ZOFRAN) 4 MG tablet Take 4 mg by mouth every 12 (twelve) hours as needed for nausea. 01/04/20   [provider]  VOLTAREN 1 % GEL Apply 2 g topically 3 (three) times daily as needed (left leg pain).  12/04/17   [provider]    Inpatient Medications:  . allopurinol  200 mg Oral Daily  . amiodarone  200 mg Oral BID  . amLODipine  10 mg Oral Daily  . apixaban  5 mg Oral BID  . atorvastatin  80 mg Oral QPM  . bumetanide  2 mg Oral BID  . Chlorhexidine Gluconate Cloth  6 each Topical Q0600  . cholecalciferol  1,000 Units Oral Daily  . ezetimibe  10 mg Oral Daily  . gabapentin  100 mg Oral BID  . hydrOXYzine  10 mg Oral Daily  . insulin aspart  0-5 Units Subcutaneous QHS  . insulin aspart  0-9 Units Subcutaneous TID WC  . insulin detemir  7 Units Subcutaneous QHS  . isosorbide mononitrate  60 mg Oral Once per day on Sun Mon Wed Fri  . metoprolol succinate  100 mg Oral Daily  . multivitamin with minerals  1 tablet Oral Daily  . pantoprazole  40 mg Oral Daily  . ranolazine  500 mg Oral BID  . sevelamer carbonate  2,400 mg Oral TID WC  . ticagrelor  90 mg Oral BID     Allergies:  Allergies  Allergen Reactions  . Losartan     Hyperkalemia (K>6)  . Morphine Anxiety  . Morphine And Related Other (See Comments)    Overdose in hospital per patient report    Social History   Socioeconomic History  . Marital status: Married    Spouse name: Not on file  . Number of children: Not on file  . Years of education: Not on file  . Highest education level: Not on file  Occupational History  . Not on file  Tobacco Use  . 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 Smokeless tobacco: Current User    Types: Snuff  Vaping Use  . Vaping Use: Never used  Substance and Sexual Activity  . Alcohol use: Never  . Drug use: Never  . Sexual activity: Not Currently    Birth control/protection:  None  Other Topics Concern  . Not on file  Social History Narrative  . Not on file  Social Determinants of Health   Financial Resource Strain: Not on file  Food Insecurity: Not on file  Transportation Needs: Not on file  Physical Activity: Not on file  Stress: Not on file  Social Connections: Not on file  Intimate Partner Violence: Not on file     Family History  Problem Relation Age of Onset  . Diabetes Mother   . Peripheral vascular disease Mother   . COPD Father      Review of Systems Positive for shortness of breath Negative for: General:  chills, fever, night sweats or weight changes.  Cardiovascular: PND orthopnea syncope dizziness  Dermatological skin lesions rashes Respiratory: Cough congestion Urologic: Frequent urination urination at night and hematuria Abdominal: negative for nausea, vomiting, diarrhea, bright red blood per rectum, melena, or hematemesis Neurologic: negative for visual changes, and/or hearing changes  All other systems reviewed and are otherwise negative except as noted above.  Labs: No results for input(s): CKTOTAL, CKMB, TROPONINI in the last 72 hours. Lab Results  Component Value Date   WBC 7.5 04/30/2020   HGB 11.9 (L) 04/30/2020   HCT 38.8 (L) 04/30/2020   MCV 90.7 04/30/2020   PLT 265 04/30/2020    Recent Labs  Lab 04/29/20 1442 04/30/20 0535  NA 137 137  K 4.6 4.4  CL 103 106  CO2 19* 18*  BUN 27* 27*  CREATININE 6.42* 7.03*  CALCIUM 8.2* 8.0*  PROT 7.9  --   BILITOT 0.6  --   ALKPHOS 97  --   ALT 11  --   AST 23  --   GLUCOSE 244* 154*   Lab Results  Component Value Date   CHOL 100 04/13/2020   HDL 44 04/13/2020   LDLCALC 33 04/13/2020   TRIG 116 04/13/2020   No results found for: DDIMER  Radiology/Studies:  EEG  Result Date: 04/23/2020 Lora Havens, MD     04/23/2020  5:47 PM Patient Name: Ladarryl Mccuskey Vitrano MRN: [id number] Epilepsy Attending: Lora Havens Referring Physician/Provider: Dr Amie [place]  Date: 04/23/2020 Duration: 21.32 mins Patient history: 50yo M with seizure like activity. EEG to evaluate for seizure. Level of alertness: Awake AEDs during EEG study: GBP, LEV Technical aspects: This EEG study was done with scalp electrodes positioned according to the 10-20 International system of electrode placement. Electrical activity was acquired at a sampling rate of '500Hz'$  and reviewed with a high frequency filter of '70Hz'$  and a low frequency filter of '1Hz'$ . EEG data were recorded continuously and digitally stored. Description: The posterior dominant rhythm consists of 8 Hz activity of moderate voltage (25-35 uV) seen predominantly in posterior head regions, symmetric and reactive to eye opening and eye closing. Hyperventilation and photic stimulation were not performed.   IMPRESSION: This study is within normal limits. No seizures or epileptiform discharges were seen throughout the recording. Lora Havens   DG Chest 1 View  Result Date: 04/29/2020 CLINICAL DATA:  Syncope 2 EXAM: CHEST  1 VIEW COMPARISON:  04/23/2020 FINDINGS: Stable large cardiac silhouette. There is mild central venous congestion improved from prior. Low lung volumes. No pneumothorax. Large dialysis catheter noted. IMPRESSION: 1. Improvement in venous congestion pattern. 2. Cardiomegaly. Electronically Signed   By: Suzy Bouchard M.D.   On: 04/29/2020 15:16   CT Head Wo Contrast  Result Date: 04/29/2020 CLINICAL DATA:  Mental status change.  Syncope EXAM: CT HEAD WITHOUT CONTRAST TECHNIQUE: Contiguous axial images were obtained from the base of the skull through the vertex without intravenous  contrast. COMPARISON:  CT head 04/23/2020 FINDINGS: Brain: No evidence of acute infarction, hemorrhage, hydrocephalus, extra-axial collection or mass lesion/mass effect. Vascular: Negative for hyperdense vessel Skull: Negative Sinuses/Orbits: Paranasal sinuses clear.  Negative orbit Other: Motion degraded study IMPRESSION: Negative CT head  Electronically Signed   By: Franchot Gallo M.D.   On: 04/29/2020 16:33   CT Head Wo Contrast  Result Date: 04/23/2020 CLINICAL DATA:  Altered mental status.  Hypertension. EXAM: CT HEAD WITHOUT CONTRAST TECHNIQUE: Contiguous axial images were obtained from the base of the skull through the vertex without intravenous contrast. COMPARISON:  April 12, 2020. FINDINGS: Brain: Ventricles and sulci are normal in size and configuration. There is no intracranial mass, hemorrhage, extra-axial fluid collection, midline shift. Brain parenchyma appears unremarkable. No acute infarct evident. Vascular: No hyperdense vessel. There is mild calcification in the right carotid siphon region. Skull: The bony calvarium appears intact. Sinuses/Orbits: Paranasal sinuses are clear. There is a degree of exophthalmos bilaterally. Orbits appear symmetric bilaterally. Other: Mastoid air cells are clear. IMPRESSION: 1.  Normal appearing brain parenchyma.  No mass or hemorrhage. 2. There is a degree of exophthalmos, symmetric. Correlation with thyroid function advised in this regard. 3.  Slight calcification in the right carotid siphon region. Electronically Signed   By: Lowella Grip III M.D.   On: 04/23/2020 09:53   CT Head Wo Contrast  Result Date: 04/12/2020 CLINICAL DATA:  50 year old male with a history mental status changes EXAM: CT HEAD WITHOUT CONTRAST TECHNIQUE: Contiguous axial images were obtained from the base of the skull through the vertex without intravenous contrast. COMPARISON:  07/08/2018 FINDINGS: Brain: No acute intracranial hemorrhage. No midline shift or mass effect. Gray-white differentiation maintained. Unremarkable appearance of the ventricular system. Vascular: Unremarkable. Skull: No acute fracture.  No aggressive bone lesion identified. 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 Unremarkable paranasal sinuses which  are clear. No mastoid effusion. Other: None IMPRESSION: Negative for acute intracranial abnormality. Interval development of bilateral exophthalmos, which can be seen with thyroid disease. Correlation with thyroid labs recommended. Electronically Signed   By: Corrie Mckusick D.O.   On: 04/12/2020 14:55   DG Chest Portable 1 View  Result Date: 04/23/2020 CLINICAL DATA:  Altered mental status Hypertensive during dialysis Unresponsive EXAM: PORTABLE CHEST 1 VIEW COMPARISON:  04/12/2020 FINDINGS: Unchanged cardiomegaly. Scattered strandy lung opacities likely due to atelectasis. Dual lead left chest wall AICD unchanged in configuration. Dialysis catheter unchanged in position. IMPRESSION: Unchanged mild cardiomegaly. Electronically Signed   By: Miachel Roux M.D.   On: 04/23/2020 08:12   DG Chest Portable 1 View  Result Date: 04/12/2020 CLINICAL DATA:  Seizure activity, vomiting, history pacemaker, coronary artery disease, chronic systolic heart failure, diabetes mellitus, hypertension, ischemic cardiomyopathy, paroxysmal atrial fibrillation EXAM: PORTABLE CHEST 1 VIEW COMPARISON:  Portable exam 1345 hours compared to 01/23/2020 FINDINGS: RIGHT jugular dual-lumen central venous catheter with tips projecting over RIGHT atrium. LEFT subclavian AICD with leads projecting over RIGHT atrium and RIGHT ventricle. Enlargement of cardiac silhouette with vascular congestion. Stable mediastinal contours. Decreased lung volumes without gross infiltrate, pleural effusion or pneumothorax. Osseous structures unremarkable. IMPRESSION: Enlargement of cardiac silhouette with pulmonary vascular congestion post ICD. No acute abnormalities. Electronically Signed   By: Lavonia Dana M.D.   On: 04/12/2020 13:57    EKG: Atrial flutter with 2 1 block with nonspecific ST and T wave changes  Weights: Filed Weights   04/29/20 1453  Weight: Marland Kitchen)  165 kg     Physical Exam: Blood pressure (!) 105/59, pulse 81, temperature (!) 97.5 F  (36.4 C), temperature source Axillary, resp. rate 20, height '6\' 2"'$  (1.88 m), weight (!) 165 kg, SpO2 100 %. Body mass index is 46.7 kg/m. General: Well developed, well nourished, in no acute distress. Head eyes ears nose throat: Normocephalic, atraumatic, sclera non-icteric, no xanthomas, nares are without discharge. No apparent thyromegaly and/or mass  Lungs: Normal respiratory effort.  Diffuse wheezes, no rales, some rhonchi.  Heart: RRR with normal S1 S2. no murmur gallop, no rub, PMI is normal size and placement, carotid upstroke normal without bruit, jugular venous pressure is normal Abdomen: Soft, non-tender,  distended with normoactive bowel sounds. No hepatomegaly. No rebound/guarding. No obvious abdominal masses. Abdominal aorta is normal size without bruit Extremities: 1+ edema. no cyanosis, no clubbing, no ulcers  Peripheral : 2+ bilateral upper extremity pulses, 2+ bilateral femoral pulses, 2+ bilateral dorsal pedal pulse Neuro: Alert and oriented. No facial asymmetry. No focal deficit. Moves all extremities spontaneously. Musculoskeletal: Normal muscle tone without kyphosis Psych:  Responds to questions appropriately with a normal affect.    Assessment: 50 year old male with known dilated cardiomyopathy hypertension hyperlipidemia diabetes chronic kidney disease on dialysis having Covid infection which likely is causing some shortness of breath but no evidence of acute coronary syndrome, congestive heart failure, and/or myocardial infarction with some unresponsiveness for short periods of time possibly secondary to pseudoseizure with full work-up and currently no etiology of his spells  Plan: 1.  Continue supportive care for recent infection from Covid and without restriction 2.  Continue amiodarone for treatment of atrial fibrillation currently remaining in normal sinus rhythm although will consider further discussion with primary cardiologist at [facility] for the possibility of atrial  flutter ablation if necessary 3.  Continue metoprolol for heart rate control of evidence of supraventricular tachycardia which appears to be appropriate dosage at this time 4.  Anticoagulation with Eliquis 5 mg twice per day as currently given 5.  Further consideration of diltiazem long-acting if atrial flutter or atrial fibrillation needs heart rate control but currently appears to be stable 6.  No further cardiac diagnostics or intervention at this time due to no evidence of congestive heart failure worsening rhythm disturbances congestive heart failure or myocardial infarction 7.  High intensity cholesterol therapy 8.  Continue amlodipine for hypertension control and isosorbide for other symptoms of chest pain that he has had in the past 9.  Call if further questions or if recurrence of atrial flutter occurs needing adjustments of medication management otherwise would treat as outpatient if recurrence of rapid rate with possible ablation consideration  Signed, Corey Skains M.D. [facility] 04/30/2020, 1:37 PM

## 2020-04-30 NOTE — TOC Initial Note (Deleted)
Transition of Care [facility]) - Initial/Assessment Note    Patient Details  Name: Edwin [name] MRN: [id number] Date of Birth: 06-14-1970  Transition of [facility]) CM/SW Contact:    Ova Freshwater Phone Number: [phone] 04/30/2020, 9:30 AM  Clinical Narrative:                  PT assessed patient, did not recommend Home Health or SNF placement, and did not recommend DME.  TOC will continue to follow.       Patient Goals and CMS Choice        Expected Discharge Plan and Services                                                Prior Living Arrangements/Services                       Activities of Daily Living      Permission Sought/Granted                  Emotional Assessment              Admission diagnosis:  Syncope [R55] Patient Active Problem List   Diagnosis Date Noted  . COVID-19 virus infection 04/29/2020  . Recurrent episodes of unresponsiveness 04/23/2020  . Dilated cardiomyopathy ([place]) 04/23/2020  . OSA (obstructive sleep apnea) 04/23/2020  . Syncope 04/12/2020  . Elevated troponin 04/12/2020  . Atrial fibrillation, chronic ([place]) 04/12/2020  . GERD (gastroesophageal reflux disease) 04/12/2020  . Gout 04/12/2020  . Depression 04/12/2020  . Obesity, Class III, BMI 40-49.9 (morbid obesity) ([place])   . Chronic systolic CHF (congestive heart failure) ([place])   . ESRD (end stage renal disease) ([place]) 01/23/2020  . CAD (coronary artery disease) 01/23/2020  . Type 2 diabetes mellitus with ESRD (end-stage renal disease) ([place]) 01/23/2020  . Essential hypertension 01/23/2020  . Hyperlipidemia 01/23/2020  . Atrial fibrillation with rapid ventricular response ([place]) 01/23/2020  . Syncope and collapse 10/28/2018  . Chest pain 07/08/2018  . NSTEMI (non-ST elevated myocardial infarction) ([place]) 03/08/2018  . Infection of left knee ([place]) 12/16/2017   PCP:  Caryl Never, MD Pharmacy:   [facility] [place], [place] - [address] [place] [address] Phone: [phone] Fax: [phone]     Social Determinants of Health (SDOH) Interventions    Readmission Risk Interventions Readmission Risk Prevention Plan 01/24/2020  Transportation Screening Complete  PCP or Specialist Appt within 3-5 Days Complete  HRI or [place] Complete  Social Work Consult for [place] Planning/Counseling Patient refused  Palliative Care Screening Not Applicable  Medication Review (RN Care Manager) Referral to Pharmacy  Some recent data might be hidden

## 2020-04-30 NOTE — ED Notes (Signed)
Report to kassie, rn.

## 2020-04-30 NOTE — ED Notes (Addendum)
Pt's hr elevated, afib noted on monitor with rate 110-130s. . Pt states he is nauseated and has a 10/10 headache. Pt states history of afib. Dr. Sidney Ace notified via secure text of hr and pt's headache/nausea with request for iv po meds by this rn.

## 2020-04-30 NOTE — ED Notes (Signed)
Admitting MD at bedside at this time.

## 2020-04-30 NOTE — Evaluation (Signed)
Physical Therapy Evaluation Patient Details Name: Edwin [name] MRN: [id number] DOB: Oct 23, 1970 Today's Date: 04/30/2020   History of Present Illness   Pt is a 50 year old male with a past medical history of coronary artery disease, CHF with ejection fraction of 15 to 20%, status post AICD, hypertension, ESRD on hemodialysis, gout, depression, atrial fibrillation, recent diagnosis of Covid who was brought to the ER with chief complaint of syncope. ER AICD was interrogated and patient was found to have atrial rate of more than 250  Patient had another episode of absence seizure breath syncope while undergoing CT scan.    Clinical Impression  Patient alert, agreeable to PT, able to provide clear PLOF, and denied pain. Pt without recent falls at home, primarily uses [facility] for community ambulation and has varying ability to ambulate (54f to a few hundred yards).  The patient demonstrated independent bed mobility. Able to sit with good balance, but pt reported dizziness immediately. Reported 9/10 dizziness that decreased to 3/10 dizziness over time. However, pt also complained of nausea and became more flat for an affect. Returned to supine for safety, and pt became emotional. PT provided comfort and listening as able. Further mobility deferred at this time. Pt also assessed for orthostatics, unremarkable, HR spO2 WFLs throughout mobility attempts as well. Pending further workup pt Paiva benefit from vestibular evaluation. Pending pt progress and more resolvement of dizziness, recommendation is HHPT with supervision/assistance 24/7 to ensure safety.    Follow Up Recommendations Home health PT;Supervision/Assistance - 24 hour    Equipment Recommendations  Other (comment) (TBD)    Recommendations for Other Services       Precautions / Restrictions Precautions Precautions: Fall Precaution Comments: Seizure precautions Restrictions Weight Bearing Restrictions: No      Mobility  Bed  Mobility Overal bed mobility: Independent                  Transfers                 General transfer comment: deferred due to dizziness, pt emotional  Ambulation/Gait                Stairs            Wheelchair Mobility    Modified Rankin (Stroke Patients Only)       Balance Overall balance assessment: Mild deficits observed, not formally tested                                           Pertinent Vitals/Pain Pain Assessment: No/denies pain    Home Living Family/patient expects to be discharged to:: Private residence Living Arrangements: Spouse/significant other Available Help at Discharge: Family;Available 24 hours/day Type of Home: House Home Access: Stairs to enter Entrance Stairs-Rails: Right;Left;Can reach both Entrance Stairs-Number of Steps: 2 Home Layout: One level Home Equipment: Walker - standard;Walker - 4 wheels;Cane - single point;Bedside commode      Prior Function Level of Independence: Independent with assistive device(s)         Comments: Mod Ind Amb with a SPC 50 feet max, w/c for longer distances, Ind with ADLs, one fall in last 6 months secondary to dog running into him     Hand Dominance        Extremity/Trunk Assessment   Upper Extremity Assessment Upper Extremity Assessment: Overall WFL for tasks assessed    Lower Extremity  Assessment Lower Extremity Assessment: Generalized weakness    Cervical / Trunk Assessment Cervical / Trunk Assessment: Normal  Communication   Communication: No difficulties  Cognition Arousal/Alertness: Awake/alert Behavior During Therapy: WFL for tasks assessed/performed;Anxious Overall Cognitive Status: Within Functional Limits for tasks assessed                                        General Comments      Exercises     Assessment/Plan    PT Assessment Patient needs continued PT services  PT Problem List Decreased  strength;Decreased activity tolerance;Decreased balance;Decreased mobility;Decreased knowledge of use of DME       PT Treatment Interventions DME instruction;Balance training;Gait training;Neuromuscular re-education;Stair training;Functional mobility training;Patient/family education;Therapeutic activities;Therapeutic exercise    PT Goals (Current goals can be found in the Care Plan section)  Acute Rehab PT Goals Patient Stated Goal: to stop being dizzy    Frequency Min 2X/week   Barriers to discharge        Co-evaluation               AM-PAC PT "6 Clicks" Mobility  Outcome Measure Help needed turning from your back to your side while in a flat bed without using bedrails?: None Help needed moving from lying on your back to sitting on the side of a flat bed without using bedrails?: None Help needed moving to and from a bed to a chair (including a wheelchair)?: A Little Help needed standing up from a chair using your arms (e.g., wheelchair or bedside chair)?: A Little Help needed to walk in hospital room?: A Little Help needed climbing 3-5 steps with a railing? : A Little 6 Click Score: 20    End of Session   Activity Tolerance: Other (comment) (limited by dizziness) Patient left: in bed;with call bell/phone within reach Nurse Communication: Mobility status PT Visit Diagnosis: Muscle weakness (generalized) (M62.81);Other abnormalities of gait and mobility (R26.89)    Time: [id number] PT Time Calculation (min) (ACUTE ONLY): 25 min   Charges:   PT Evaluation $PT Eval Low Complexity: 1 Low PT Treatments $Therapeutic Activity: 8-22 mins       Lieutenant Diego PT, DPT 2:33 PM,04/30/20

## 2020-04-30 NOTE — Progress Notes (Signed)
MD unavailable to speak with patient related to dietary. MD secure chat said he will speak to patient if he is still here tomorrow.  I went back into room and spoke with patient again regarding safety of leaving AMA. Patient requested a snack, I provided, patient agreeable to stay currently.

## 2020-04-30 NOTE — Progress Notes (Signed)
Secured chat neuro MD that patient's wife is requesting the MD call her directly with updates. I have updated wife via phone on admission to unit from ED.

## 2020-04-30 NOTE — ED Notes (Signed)
Pt to be transported at this time by [employer]

## 2020-04-30 NOTE — Consult Note (Signed)
Neurology Consultation Reason for Consult: Convulsion Referring Physician: Cyndi Bender  CC: LOC  History is obtained from:PAtient, wife  HPI: Edwin [name] is a 50 y.o. male with a history of episodes since at least early 2020.  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  If he is sitting, he will fall over, if he is standing he will fall down.  He does not have any eye deviation with these.  He will have some stiffening and jerking type movements bilaterally with them.  She reports these episodes happening fairly frequently, several times a week.  The patient reports that he will occasionally pass out, but that it does not happen at home.  He reports 1-2 times per month.   He has been evaluated by physicians at [facility] multiple times for similar episodes.  He has been diagnosed with pseudoseizures, though he has never had one of these episodes captured on continuous EEG.  From 05/2018 hospitalization: "From description, it is possible that the patient had seizure activity causing transient alteration in consciousness. However, when he was observed during 1 of these events, and he would repeatedly avoid his face with hand drop suggesting a functional exam. Also, given his cardiac history with recent cath and stent placement as well as a history of atrial fibrillation, it is possible that the patient had arrhythmias causing alteration in consciousness." He had cvEEG in 05/2018 and 01/2020, both with no events recorded and no epileptiform activity on EEG.    ROS: A 14 point ROS was performed and is negative except as noted in the HPI.  Past Medical History:  Diagnosis Date  . CAD (coronary artery disease)   . Chronic systolic heart failure ([place])   . Diabetes mellitus without complication ([place])   . Heart attack ([place])   . Hyperlipidemia   . Hypertension   . Ischemic cardiomyopathy   . Paroxysmal atrial fibrillation ([name])   . Renal disorder       Family History  Problem Relation Age of Onset  . Diabetes Mother   . Peripheral vascular disease Mother   . COPD Father      Social History:  reports that he has quit smoking. His smokeless tobacco use includes snuff. He reports that he does not drink alcohol and does not use drugs.   Exam: Current vital signs: BP (!) 105/59 (BP Location: Left Arm)   Pulse 81   Temp (!) 97.5 F (36.4 C) (Axillary)   Resp 20   Ht '6\' 2"'$  (1.88 m)   Wt (!) 165 kg   SpO2 100%   BMI 46.70 kg/m  Vital signs in last 24 hours: Temp:  [97.5 F (36.4 C)-97.9 F (36.6 C)] 97.5 F (36.4 C) (01/31 1242) Pulse Rate:  [54-106] 81 (01/31 1242) Resp:  [10-30] 20 (01/31 1242) BP: (97-174)/(59-122) 105/59 (01/31 1242) SpO2:  [89 %-100 %] 100 % (01/31 1242) Weight:  [165 kg] 165 kg (01/30 1453)   Physical Exam  Constitutional: Appears well-developed and well-nourished.  Psych: Affect appropriate to situation Eyes: No scleral injection HENT: No OP obstruction MSK: no joint deformities.  Cardiovascular: Normal rate and regular rhythm.  Respiratory: Effort normal, non-labored breathing GI: Soft.  No distension. There is no tenderness.  Skin: WDI  Neuro: Mental Status: Patient is awake, alert, oriented to person, place, month, year, and situation. Patient is able to give a clear and coherent history. No signs of aphasia or neglect Cranial Nerves: II: Visual Fields  are full. Pupils are equal, round, and reactive to light.   III,IV, VI: EOMI without ptosis or diploplia.  V: Facial sensation is symmetric to temperature VII: Facial movement is symmetric.  VIII: hearing is intact to voice X: Uvula elevates symmetrically XI: Shoulder shrug is symmetric. XII: tongue is midline without atrophy or fasciculations.  Motor: Tone is normal. Bulk is normal. 5/5 strength was present in all four extremities with the exception of decreased knee extensiona nd ankle dorsiflexion on the left.   Sensory: Sensation is diminishe din the left leg.  Cerebellar: No clear ataxia on FNF   I have reviewed labs in epic and the results pertinent to this consultation are: Cr 7 BUN 27   I have reviewed the images obtained:CT head - negative  Impression: 50 yo M with recurrent episodes of behavioral arrest as well as one previously seen convulsive seizure who presents with more episodes of behavioral arrest/syncope.  At this time, without having witnessed the episode and without any abnormalities on EEG, I would be hesitant to add an antiepileptic.  That being said, given the description of a single convulsive seizure back in December, which is different than his typical spells, I do think that driving prohibition should be enforced at this time.  I have discussed this with both the patient and his wife.  Given multiple inpatient stays with continuous EEG monitoring without capturing a spell, I would favor ambulatory EEG as a next step in diagnostic testing.  Unfortunately, we cannot arrange that from the inpatient setting, but I suggested his wife discussed that with his neurologist when they follow-up.  One other option would be doing a trial of an antiepileptic to see if it affects spell frequency, and I think I would choose Depakote where he could pursue this, but again I am hesitant to do that without any ancillary evidence in patient with a previous diagnosis of pseudoseizure.  Recommendations: 1) EEG 2) antiepileptic only if EEG is positive 3) no driving until cleared by outpatient neurology, I have discussed that with the patient and his wife.   Roland Rack, MD [facility] [phone]  If 7pm- 7am, please page neurology on call as listed in [place].

## 2020-04-30 NOTE — Unmapped (Signed)
This was a telehealth service performed by a resident and I was available to the resident via real-time audio and video connection. Immediately after or during the visit, I reviewed with the resident the medical history and the resident???s assessment and plan. I discussed with the resident the patient???s diagnosis and concur with the treatment plan as documented in the resident note. Hazeline Junker, MD

## 2020-05-01 ENCOUNTER — Institutional Professional Consult (permissible substitution): Admit: 2020-05-01 | Discharge: 2020-05-02 | Payer: MEDICARE

## 2020-05-01 DIAGNOSIS — I5042 Chronic combined systolic (congestive) and diastolic (congestive) heart failure: Principal | ICD-10-CM

## 2020-05-01 DIAGNOSIS — Z4502 Encounter for adjustment and management of automatic implantable cardiac defibrillator: Principal | ICD-10-CM

## 2020-05-01 DIAGNOSIS — N186 End stage renal disease: Secondary | ICD-10-CM | POA: Diagnosis not present

## 2020-05-01 DIAGNOSIS — I482 Chronic atrial fibrillation, unspecified: Secondary | ICD-10-CM | POA: Diagnosis not present

## 2020-05-01 DIAGNOSIS — I5022 Chronic systolic (congestive) heart failure: Secondary | ICD-10-CM | POA: Diagnosis not present

## 2020-05-01 DIAGNOSIS — U071 COVID-19: Secondary | ICD-10-CM | POA: Diagnosis not present

## 2020-05-01 LAB — CBC WITH DIFFERENTIAL/PLATELET
Abs Immature Granulocytes: 0.05 10*3/uL (ref 0.00–0.07)
Basophils Absolute: 0 10*3/uL (ref 0.0–0.1)
Basophils Relative: 1 %
Eosinophils Absolute: 0.1 10*3/uL (ref 0.0–0.5)
Eosinophils Relative: 2 %
HCT: 32.9 % — ABNORMAL LOW (ref 39.0–52.0)
Hemoglobin: 10.6 g/dL — ABNORMAL LOW (ref 13.0–17.0)
Immature Granulocytes: 1 %
Lymphocytes Relative: 23 %
Lymphs Abs: 1.4 10*3/uL (ref 0.7–4.0)
MCH: 28.6 pg (ref 26.0–34.0)
MCHC: 32.2 g/dL (ref 30.0–36.0)
MCV: 88.9 fL (ref 80.0–100.0)
Monocytes Absolute: 0.5 10*3/uL (ref 0.1–1.0)
Monocytes Relative: 9 %
Neutro Abs: 3.9 10*3/uL (ref 1.7–7.7)
Neutrophils Relative %: 64 %
Platelets: 244 10*3/uL (ref 150–400)
RBC: 3.7 MIL/uL — ABNORMAL LOW (ref 4.22–5.81)
RDW: 15.9 % — ABNORMAL HIGH (ref 11.5–15.5)
WBC: 6 10*3/uL (ref 4.0–10.5)
nRBC: 0 % (ref 0.0–0.2)

## 2020-05-01 LAB — COMPREHENSIVE METABOLIC PANEL
ALT: 10 U/L (ref 0–44)
AST: 12 U/L — ABNORMAL LOW (ref 15–41)
Albumin: 2.5 g/dL — ABNORMAL LOW (ref 3.5–5.0)
Alkaline Phosphatase: 82 U/L (ref 38–126)
Anion gap: 12 (ref 5–15)
BUN: 34 mg/dL — ABNORMAL HIGH (ref 6–20)
CO2: 20 mmol/L — ABNORMAL LOW (ref 22–32)
Calcium: 7.6 mg/dL — ABNORMAL LOW (ref 8.9–10.3)
Chloride: 104 mmol/L (ref 98–111)
Creatinine, Ser: 7.3 mg/dL — ABNORMAL HIGH (ref 0.61–1.24)
GFR, Estimated: 8 mL/min — ABNORMAL LOW (ref >60)
Glucose, Bld: 179 mg/dL — ABNORMAL HIGH (ref 70–99)
Potassium: 4.4 mmol/L (ref 3.5–5.1)
Sodium: 136 mmol/L (ref 135–145)
Total Bilirubin: 0.4 mg/dL (ref 0.3–1.2)
Total Protein: 6.6 g/dL (ref 6.5–8.1)

## 2020-05-01 LAB — AMMONIA: Ammonia: 26 umol/L (ref 9–35)

## 2020-05-01 LAB — VITAMIN B12: Vitamin B-12: 397 pg/mL (ref 180–914)

## 2020-05-01 LAB — TSH: TSH: 4.766 u[IU]/mL — ABNORMAL HIGH (ref 0.350–4.500)

## 2020-05-01 LAB — GLUCOSE, CAPILLARY: Glucose-Capillary: 164 mg/dL — ABNORMAL HIGH (ref 70–99)

## 2020-05-01 LAB — FOLATE: Folate: 8.9 ng/mL (ref >5.9)

## 2020-05-01 MED ORDER — ALTEPLASE 2 MG IJ SOLR: 2.0000 mg | Freq: Once | INTRAMUSCULAR | Status: DC | PRN

## 2020-05-01 MED ORDER — SODIUM CHLORIDE 0.9 % IV SOLN: 100.0000 mL | INTRAVENOUS | Status: DC | PRN

## 2020-05-01 MED ORDER — HEPARIN SODIUM (PORCINE) 1000 UNIT/ML DIALYSIS
1000.0000 [IU] | INTRAMUSCULAR | Status: DC | PRN
  Filled 2020-05-01: qty 1

## 2020-05-01 MED ORDER — PENTAFLUOROPROP-TETRAFLUOROETH EX AERO
1.0000 "application " | INHALATION_SPRAY | CUTANEOUS | Status: DC | PRN
  Filled 2020-05-01: qty 30

## 2020-05-01 MED ORDER — LIDOCAINE HCL (PF) 1 % IJ SOLN
5.0000 mL | INTRAMUSCULAR | Status: DC | PRN
  Filled 2020-05-01: qty 5

## 2020-05-01 MED ORDER — LIDOCAINE-PRILOCAINE 2.5-2.5 % EX CREA
1.0000 "application " | TOPICAL_CREAM | CUTANEOUS | Status: DC | PRN
  Filled 2020-05-01: qty 5

## 2020-05-01 NOTE — Procedures (Addendum)
Pt in dialysis today-said he just had this test the 24th so did not want another one.

## 2020-05-01 NOTE — Progress Notes (Signed)
[place], [place] 05/01/20  Subjective:   LOS: 1 Hospital course: Patient recently admitted on January 24.  He was brought in by EMS from dialysis for hypertension, became unresponsive and had altered mental status.  He possibly had a seizure, V. tach and lost pulses.  CPR was performed   Patient seen during dialysis Tolerating well    HEMODIALYSIS FLOWSHEET:  Blood Flow Rate (mL/min): 250 mL/min Arterial Pressure (mmHg): -90 mmHg Venous Pressure (mmHg): 60 mmHg Transmembrane Pressure (mmHg): 20 mmHg Ultrafiltration Rate (mL/min): 0 mL/min Dialysate Flow Rate (mL/min): 500 ml/min Conductivity: Machine : 14.3 Conductivity: Machine : 14.3 Dialysis Fluid Bolus: Normal Saline Bolus Amount (mL): 250 mL     Objective:  Vital signs in last 24 hours:  Temp:  [97.3 F (36.3 C)-98.1 F (36.7 C)] 98.1 F (36.7 C) (02/01 1011) Pulse Rate:  [62-85] 73 (02/01 1458) Resp:  [10-23] 20 (02/01 1458) BP: (108-140)/(65-87) 131/82 (02/01 1458) SpO2:  [94 %-100 %] 96 % (02/01 1458) Weight:  [156.2 kg] 156.2 kg (02/01 0338)  Weight change: -8.8 kg Filed Weights   04/29/20 1453 05/01/20 0338  Weight: (!) 165 kg (!) 156.2 kg    Intake/Output:    Intake/Output Summary (Last 24 hours) at 05/01/2020 1601 Last data filed at 05/01/2020 1311 Gross per 24 hour  Intake 600 ml  Output 422 ml  Net 178 ml    Physical Exam: General:  No acute distress,   HEENT  anicteric, moist oral mucous membrane  Pulm/lungs  normal breathing effort, lungs are clear to auscultation  CVS/Heart  regular rhythm, no rub or gallop  Abdomen:   Soft, nontender  Extremities:  No peripheral edema  Neurologic:  Alert, oriented, able to follow commands  Skin:  No acute rashes  Right IJ PermCath   Basic Metabolic Panel:  Recent Labs  Lab 04/25/20 0458 04/29/20 1442 04/30/20 0535 05/01/20 0512  NA 136 137 137 136  K 4.1 4.6 4.4 4.4  CL 101 103 106 104  CO2 23 19* 18* 20*   GLUCOSE 130* 244* 154* 179*  BUN 25* 27* 27* 34*  CREATININE 5.30* 6.42* 7.03* 7.30*  CALCIUM 7.4* 8.2* 8.0* 7.6*  MG  --   --  2.0  --      CBC: Recent Labs  Lab 04/25/20 0458 04/29/20 1442 04/30/20 0535 05/01/20 0512  WBC 5.5 5.8 7.5 6.0  NEUTROABS  --  4.2  --  3.9  HGB 10.3* 11.5* 11.9* 10.6*  HCT 32.1* 36.1* 38.8* 32.9*  MCV 88.2 88.0 90.7 88.9  PLT 171 270 265 244      Lab Results  Component Value Date   HEPBSAG NON REACTIVE 04/25/2020      Microbiology:  Recent Results (from the past 240 hour(s))  SARS Coronavirus 2 by RT PCR (hospital order, performed in [place] hospital lab) Nasopharyngeal Nasopharyngeal Swab     Status: Abnormal   Collection Time: 04/23/20  8:15 AM   Specimen: Nasopharyngeal Swab  Result Value Ref Range Status   SARS Coronavirus 2 POSITIVE (A) NEGATIVE Final    Comment: RESULT CALLED TO, READ BACK BY AND VERIFIED WITH: SAMANTHA HAMILTON ON 04/23/20 AT 1052 QSD (NOTE) SARS-CoV-2 target nucleic acids are DETECTED  SARS-CoV-2 RNA is generally detectable in upper respiratory specimens  during the acute phase of infection.  Positive results are indicative  of the presence of the identified virus, but do not rule out bacterial infection or co-infection with other pathogens not detected by  the test.  Clinical correlation with patient history and  other diagnostic information is necessary to determine patient infection status.  The expected result is negative.  Fact Sheet for Patients:   StrictlyIdeas.no   Fact Sheet for Healthcare Providers:   BankingDealers.co.za    This test is not yet approved or cleared by the [place] FDA and  has been authorized for detection and/or diagnosis of SARS-CoV-2 by FDA under an Emergency Use Authorization (EUA).  This EUA will remain in effect (meaning t his test can be used) for the duration of  the COVID-19 declaration under Section 564(b)(1) of  the Act, 21 U.S.C. section 360-bbb-3(b)(1), unless the authorization is terminated or revoked sooner.  Performed at [facility], [address] [place], [place] [address]     Coagulation Studies: No results for input(s): LABPROT, INR in the last 72 hours.  Urinalysis: No results for input(s): COLORURINE, LABSPEC, PHURINE, GLUCOSEU, HGBUR, BILIRUBINUR, KETONESUR, PROTEINUR, UROBILINOGEN, NITRITE, LEUKOCYTESUR in the last 72 hours.  Invalid input(s): APPERANCEUR    Imaging: CT Head Wo Contrast  Result Date: 04/29/2020 CLINICAL DATA:  Mental status change.  Syncope EXAM: CT HEAD WITHOUT CONTRAST TECHNIQUE: Contiguous axial images were obtained from the base of the skull through the vertex without intravenous contrast. COMPARISON:  CT head 04/23/2020 FINDINGS: Brain: No evidence of acute infarction, hemorrhage, hydrocephalus, extra-axial collection or mass lesion/mass effect. Vascular: Negative for hyperdense vessel Skull: Negative Sinuses/Orbits: Paranasal sinuses clear.  Negative orbit Other: Motion degraded study IMPRESSION: Negative CT head Electronically Signed   By: Franchot Gallo M.D.   On: 04/29/2020 16:33     Medications:    . allopurinol  200 mg Oral Daily  . amiodarone  200 mg Oral BID  . amLODipine  10 mg Oral Daily  . apixaban  5 mg Oral BID  . atorvastatin  80 mg Oral QPM  . bumetanide  2 mg Oral BID  . Chlorhexidine Gluconate Cloth  6 each Topical Q0600  . cholecalciferol  1,000 Units Oral Daily  . ezetimibe  10 mg Oral Daily  . gabapentin  100 mg Oral BID  . insulin aspart  0-5 Units Subcutaneous QHS  . insulin aspart  0-9 Units Subcutaneous TID WC  . insulin detemir  7 Units Subcutaneous QHS  . isosorbide mononitrate  60 mg Oral Once per day on Sun Mon Wed Fri  . melatonin  5 mg Oral QHS  . metoprolol succinate  100 mg Oral Daily  . multivitamin with minerals  1 tablet Oral Daily  . pantoprazole  40 mg Oral Daily  . ranolazine  500 mg Oral BID   . sevelamer carbonate  2,400 mg Oral TID WC  . ticagrelor  90 mg Oral BID   acetaminophen, albuterol, dextromethorphan-guaiFENesin, diclofenac Sodium, hydrALAZINE, nitroGLYCERIN, ondansetron (ZOFRAN) IV, ondansetron, promethazine  Assessment/ Plan:  50 y.o. male with end stage renal disease on hemodialysis, hypertension, hyperlipidemia, diabetes mellitus type II, atrial fibrillation, coronary artery disease, systolic congestive heart failure, gout, depression   was admitted on 04/29/2020 for  Principal Problem:   Syncope Active Problems:   ESRD (end stage renal disease) ([name])   CAD (coronary artery disease)   Type 2 diabetes mellitus with ESRD (end-stage renal disease) ([place])   Essential hypertension   Hyperlipidemia   Chronic systolic CHF (congestive heart failure) ([name])   Elevated troponin   Atrial fibrillation, chronic ([name])   GERD (gastroesophageal reflux disease)   Gout   Depression   COVID-19 virus infection  Syncope [R55] SOB (shortness of breath) [R06.02] Syncope, unspecified syncope type [R55] Chest pain, unspecified type [R07.9] COVID [U07.1]    [facility] Nephrology [name] [facility] RIJ permcath  #. ESRD Plan for hemodialysis on TTS Covid shift while in hospital Dialyzed successfully today without any problem on 3K bath   #. Anemia of CKD  Lab Results  Component Value Date   HGB 10.6 (L) 05/01/2020   Low dose EPO with HD for hemoglobin less than 10  #. Secondary hyperparathyroidism of renal origin N 25.81      Component Value Date/Time   PTH 22 12/20/2017 0840   Lab Results  Component Value Date   PHOS 4.6 01/25/2020   Monitor calcium and phos level during this admission   #. Diabetes type 2 with CKD Hgb A1c MFr Bld (%)  Date Value  04/12/2020 8.1 (H)  Insulin-dependent, Levemir  #COVID-19 infection Diagnosed April 23, 2020  #Syncope Cardiac evaluation ongoing for arrhythmia - atrial fibrillation/flutter.  Rate control with metoprolol  amiodarone.  High intensity cholesterol therapy   LOS: [place] 2/1/20224:01 PM  [facility] [place], [place]

## 2020-05-01 NOTE — Progress Notes (Signed)
Patient discharged per orders. PIvs and tele removed from patient. VSS at time of DC. Taken to entrance by nursing.

## 2020-05-01 NOTE — Discharge Summary (Signed)
Physician Discharge Summary  Edwin [name] Edwin [name] [id number] DOB: 1971/01/17 DOA: 04/29/2020  PCP: Caryl Never, MD  Admit date: 04/29/2020 Discharge date: 05/01/2020  Admitted From: Home Disposition: Home  Recommendations for Outpatient Follow-up:  1. Follow up with PCP in 1 week  2. Outpatient follow-up with dialysis as scheduled 3. outpatient follow-up with his regular neurologist and cardiologist 4. Comply with medications and follow-up 5. Follow up in ED if symptoms worsen or new appear   Home Health: Home with PT Equipment/Devices: None  Discharge Condition: Guarded CODE STATUS: Full Diet recommendation: Heart healthy/carb modified/renal hemodialysis diet with fluid restriction of 1200 cc a day  Brief/Interim Summary: 50 y.o.malewith medical history significant ofhypertension, hyperlipidemia, diabetes mellitus, GERD, gout, depression, atrial fibrillation on Eliquis,sCHF with EF 15-20% (by recentcardiaccath on 03/26/2020),CAD, ESRD-HD (TTS), [employer] pacemaker placement, recent admission from 04/23/2020-04/25/2020 for unresponsiveness/seizure-like activity/cardiac syncope with work-up revealing normal EEG and brain imaging and neurology stating that this is not seizure and pseudoseizure was in differential diagnosis.  He had also tested positive for COVID-19 during the same admission but had no respiratory symptoms.  Patient presented on 04/30/2020 again with multiple episodes of syncope with seizure-like activity and also had short lasting unresponsiveness in ED.  Patient had missed his dialysis day prior to presentation.  On presentation, pacemaker interrogation was performed in the ED showing atrial rate of more than 250.  He was on room air and chest x-ray showed cardiomegaly without infiltrates.  CT head was negative.  Cardiology and neurology were consulted.  During the hospitalization, heart rate remained stable.  Cardiology recommended continued current medical  treatment with outpatient follow-up with regular cardiologist.  Neurology has cleared the patient for discharge with recommendations for outpatient follow-up with regular neurologist.  Nephrology was also consulted.  He will be discharged home with home health PT after dialysis today.  Discharge Diagnoses:   Syncope with question of seizure-like activity Chronic systolic heart failure status post pacemaker placement Coronary artery disease with minimally elevated elevated troponin -Suspected cardiogenic syncope.  Pacemaker interrogation showed atrial tachycardia with atrial rate of more than 250 -Patient recently had similar presentation and had extensive work-up including neurology and cardiology evaluation.  He had a negative EEG and brain imaging and neurology thought that patient did not have seizure; pseudoseizures was not differential diagnosis.  Neurology was consulted. Neurology has cleared the patient for discharge with recommendations for outpatient follow-up with regular neurologist.  -Recent cardiac cath showed EF of 15 to 20% on 04/27/2019.  Chest x-ray on presentation did not show pulmonary edema.   No chest pain currently.  Troponins did not trend up. Cardiology recommended continued current medical treatment with outpatient follow-up with regular cardiologist.  Continue statin, Bumex, metoprolol succinate, isosorbide mononitrate, Brilinta and ranolazine -PT recommended home with PT.  No further syncope since admission. -Discharge patient home with home health PT today after dialysis   end-stage renal disease on hemodialysis -Nephrology following.  Dialysis as per nephrology schedule.  Outpatient follow-up with dialysis unit as scheduled.  Essential hypertension -Monitor blood pressure.  Continue antihypertensives as above  Diabetes mellitus type 2 with hyperglycemia -Continue Levemir along with CBGs with SSI  Hyperlipidemia -Continue Lipitor and Zetia  Chronic A. Fib with  intermittent RVR -Continue amiodarone, Eliquis and metoprolol.  For some reason, patient was also taking Xarelto and Coreg.  Xarelto and Coreg have been discontinued.  Outpatient follow-up with cardiology  GERD -Continue Protonix  Gout -Continue allopurinol  Depression -Resume home regimen.  Outpatient  follow-up with psychiatry.  COVID-19 infection -Patient tested positive on 04/23/2020.  Chest x-ray was negative for infiltrate.  Does not need any specific treatment.  Continue isolation to finish 10-day isolation.  Generalized conditioning -PT recommended home health PT.  OSA -Outpatient follow-up  Morbid obesity -Outpatient follow-up    Discharge Instructions  Discharge Instructions    Diet - low sodium heart healthy   Complete by: As directed    Renal hemodialysis diet with fluid restriction of 1200 cc a day   Diet Carb Modified   Complete by: As directed    Increase activity slowly   Complete by: As directed    No wound care   Complete by: As directed      Allergies as of 05/01/2020      Reactions   Losartan    Hyperkalemia (K>6)   Morphine Anxiety   Morphine And Related Other (See Comments)   Overdose in hospital per patient report      Medication List    STOP taking these medications   carvedilol 25 MG tablet Commonly known as: COREG   Ozempic (0.25 or 0.5 MG/DOSE) 2 MG/1.5ML Sopn Generic drug: Semaglutide(0.25 or 0.'5MG'$ /DOS)   Rivaroxaban 15 MG Tabs tablet Commonly known as: XARELTO     TAKE these medications   acetaminophen 500 MG tablet Commonly known as: TYLENOL Take 500-1,000 mg by mouth every 6 (six) hours as needed for mild pain, moderate pain or fever.   albuterol 108 (90 Base) MCG/ACT inhaler Commonly known as: VENTOLIN HFA Inhale 2 puffs into the lungs every 6 (six) hours as needed for wheezing or shortness of breath.   Alirocumab 150 MG/ML Soaj Inject 150 mg as directed every 14 (fourteen) days.   allopurinol 100 MG  tablet Commonly known as: ZYLOPRIM Take 200 mg by mouth daily.   amiodarone 200 MG tablet Commonly known as: PACERONE Take 1 tablet (200 mg total) by mouth 2 (two) times daily.   amLODipine 10 MG tablet Commonly known as: NORVASC Take 10 mg by mouth daily.   apixaban 5 MG Tabs tablet Commonly known as: ELIQUIS Take 5 mg by mouth 2 (two) times daily.   atorvastatin 80 MG tablet Commonly known as: LIPITOR Take 80 mg by mouth every evening.   bumetanide 2 MG tablet Commonly known as: BUMEX Take 2 mg by mouth 2 (two) times daily.   cholecalciferol 25 MCG (1000 UNIT) tablet Commonly known as: VITAMIN D Take 1,000 Units by mouth daily.   empagliflozin 10 MG Tabs tablet Commonly known as: JARDIANCE Take 10 mg by mouth daily.   ezetimibe 10 MG tablet Commonly known as: ZETIA Take 10 mg by mouth daily.   FLUoxetine 40 MG capsule Commonly known as: PROZAC Take 80 mg by mouth daily.   gabapentin 100 MG capsule Commonly known as: NEURONTIN Take 1 capsule (100 mg total) by mouth 2 (two) times daily.   hydrOXYzine 10 MG tablet Commonly known as: ATARAX/VISTARIL Take 10 mg by mouth daily.   insulin detemir 100 UNIT/ML injection Commonly known as: LEVEMIR Inject 0.1 mLs (10 Units total) into the skin at bedtime.   isosorbide mononitrate 30 MG 24 hr tablet Commonly known as: IMDUR Take 2 tablets (60 mg total) by mouth daily. Do not take on dialysis days   loperamide 2 MG capsule Commonly known as: IMODIUM Take 2 mg by mouth as needed for diarrhea or loose stools.   metoprolol succinate 100 MG 24 hr tablet Commonly known as: TOPROL-XL Take 100 mg  by mouth daily.   mirtazapine 7.5 MG tablet Commonly known as: REMERON Take 7.5 mg by mouth at bedtime.   multivitamin with minerals Tabs tablet Take 1 tablet by mouth daily.   nitroGLYCERIN 0.4 MG/SPRAY spray Commonly known as: NITROLINGUAL Place 1 spray under the tongue every 5 (five) minutes x 3 doses as needed for  chest pain.   NovoLOG FlexPen 100 UNIT/ML FlexPen Generic drug: insulin aspart Inject 22 Units into the skin in the morning.   omeprazole 40 MG capsule Commonly known as: PRILOSEC Take 40 mg by mouth 2 (two) times daily.   ondansetron 4 MG tablet Commonly known as: ZOFRAN Take 4 mg by mouth every 12 (twelve) hours as needed for nausea.   ranolazine 500 MG 12 hr tablet Commonly known as: RANEXA Take 500 mg by mouth 2 (two) times daily.   Renvela 800 MG tablet Generic drug: sevelamer carbonate Take 2,400 mg by mouth 3 (three) times daily with meals.   sevelamer 800 MG tablet Commonly known as: RENAGEL Take 800 mg by mouth daily.   ticagrelor 90 MG Tabs tablet Commonly known as: BRILINTA Take 90 mg by mouth 2 (two) times daily.   traZODone 50 MG tablet Commonly known as: DESYREL Take 50 mg by mouth daily.   Victoza 18 MG/3ML Sopn Generic drug: liraglutide Inject 0.6 mg into the skin once a week.   Voltaren 1 % Gel Generic drug: diclofenac Sodium Apply 2 g topically 3 (three) times daily as needed (left leg pain).       Follow-up Information    Gladman, Okey Dupre, MD. Schedule an appointment as soon as possible for a visit in 1 week(s).   Specialty: Internal Medicine Contact information: [address] [place] [address] [phone]        Neurologist. Schedule an appointment as soon as possible for a visit in 1 week(s).        Cardiologist. Schedule an appointment as soon as possible for a visit in 1 week(s).              Allergies  Allergen Reactions  . Losartan     Hyperkalemia (K>6)  . Morphine Anxiety  . Morphine And Related Other (See Comments)    Overdose in hospital per patient report    Consultations:  Cardiology/neurology/nephrology   Procedures/Studies: EEG  Result Date: 04/23/2020 Lora Havens, MD     04/23/2020  5:47 PM Patient Name: Kahekili Gorbett Epps MRN: [id number] Epilepsy Attending: Lora Havens Referring Physician/Provider: Dr Amie Portland Date: 04/23/2020 Duration: 21.32 mins Patient history: 50yo M with seizure like activity. EEG to evaluate for seizure. Level of alertness: Awake AEDs during EEG study: GBP, LEV Technical aspects: This EEG study was done with scalp electrodes positioned according to the 10-20 International system of electrode placement. Electrical activity was acquired at a sampling rate of '500Hz'$  and reviewed with a high frequency filter of '70Hz'$  and a low frequency filter of '1Hz'$ . EEG data were recorded continuously and digitally stored. Description: The posterior dominant rhythm consists of 8 Hz activity of moderate voltage (25-35 uV) seen predominantly in posterior head regions, symmetric and reactive to eye opening and eye closing. Hyperventilation and photic stimulation were not performed.   IMPRESSION: This study is within normal limits. No seizures or epileptiform discharges were seen throughout the recording. Lora Havens   DG Chest 1 View  Result Date: 04/29/2020 CLINICAL DATA:  Syncope 2 EXAM: CHEST  1  VIEW COMPARISON:  04/23/2020 FINDINGS: Stable large cardiac silhouette. There is mild central venous congestion improved from prior. Low lung volumes. No pneumothorax. Large dialysis catheter noted. IMPRESSION: 1. Improvement in venous congestion pattern. 2. Cardiomegaly. Electronically Signed   By: Suzy Bouchard M.D.   On: 04/29/2020 15:16   CT Head Wo Contrast  Result Date: 04/29/2020 CLINICAL DATA:  Mental status change.  Syncope EXAM: CT HEAD WITHOUT CONTRAST TECHNIQUE: Contiguous axial images were obtained from the base of the skull through the vertex without intravenous contrast. COMPARISON:  CT head 04/23/2020 FINDINGS: Brain: No evidence of acute infarction, hemorrhage, hydrocephalus, extra-axial collection or mass lesion/mass effect. Vascular: Negative for hyperdense vessel Skull: Negative Sinuses/Orbits: Paranasal sinuses clear.  Negative orbit Other:  Motion degraded study IMPRESSION: Negative CT head Electronically Signed   By: Franchot Gallo M.D.   On: 04/29/2020 16:33   CT Head Wo Contrast  Result Date: 04/23/2020 CLINICAL DATA:  Altered mental status.  Hypertension. EXAM: CT HEAD WITHOUT CONTRAST TECHNIQUE: Contiguous axial images were obtained from the base of the skull through the vertex without intravenous contrast. COMPARISON:  April 12, 2020. FINDINGS: Brain: Ventricles and sulci are normal in size and configuration. There is no intracranial mass, hemorrhage, extra-axial fluid collection, midline shift. Brain parenchyma appears unremarkable. No acute infarct evident. Vascular: No hyperdense vessel. There is mild calcification in the right carotid siphon region. Skull: The bony calvarium appears intact. Sinuses/Orbits: Paranasal sinuses are clear. There is a degree of exophthalmos bilaterally. Orbits appear symmetric bilaterally. Other: Mastoid air cells are clear. IMPRESSION: 1.  Normal appearing brain parenchyma.  No mass or hemorrhage. 2. There is a degree of exophthalmos, symmetric. Correlation with thyroid function advised in this regard. 3.  Slight calcification in the right carotid siphon region. Electronically Signed   By: Lowella Grip III M.D.   On: 04/23/2020 09:53   CT Head Wo Contrast  Result Date: 04/12/2020 CLINICAL DATA:  50 year old male with a history mental status changes EXAM: CT HEAD WITHOUT CONTRAST TECHNIQUE: Contiguous axial images were obtained from the base of the skull through the vertex without intravenous contrast. COMPARISON:  07/08/2018 FINDINGS: Brain: No acute intracranial hemorrhage. No midline shift or mass effect. Gray-white differentiation maintained. Unremarkable appearance of the ventricular system. Vascular: Unremarkable. Skull: No acute fracture.  No aggressive bone lesion identified. 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 Unremarkable paranasal sinuses which are clear. No mastoid effusion. Other: None IMPRESSION: Negative for acute intracranial abnormality. Interval development of bilateral exophthalmos, which can be seen with thyroid disease. Correlation with thyroid labs recommended. Electronically Signed   By: Corrie Mckusick D.O.   On: 04/12/2020 14:55   DG Chest Portable 1 View  Result Date: 04/23/2020 CLINICAL DATA:  Altered mental status Hypertensive during dialysis Unresponsive EXAM: PORTABLE CHEST 1 VIEW COMPARISON:  04/12/2020 FINDINGS: Unchanged cardiomegaly. Scattered strandy lung opacities likely due to atelectasis. Dual lead left chest wall AICD unchanged in configuration. Dialysis catheter unchanged in position. IMPRESSION: Unchanged mild cardiomegaly. Electronically Signed   By: Miachel Roux M.D.   On: 04/23/2020 08:12   DG Chest Portable 1 View  Result Date: 04/12/2020 CLINICAL DATA:  Seizure activity, vomiting, history pacemaker, coronary artery disease, chronic systolic heart failure, diabetes mellitus, hypertension, ischemic cardiomyopathy, paroxysmal atrial fibrillation EXAM: PORTABLE CHEST 1 VIEW COMPARISON:  Portable exam 1345 hours compared to 01/23/2020 FINDINGS: RIGHT jugular dual-lumen central venous catheter with tips projecting over RIGHT atrium. LEFT  subclavian AICD with leads projecting over RIGHT atrium and RIGHT ventricle. Enlargement of cardiac silhouette with vascular congestion. Stable mediastinal contours. Decreased lung volumes without gross infiltrate, pleural effusion or pneumothorax. Osseous structures unremarkable. IMPRESSION: Enlargement of cardiac silhouette with pulmonary vascular congestion post ICD. No acute abnormalities. Electronically Signed   By: Lavonia Dana M.D.   On: 04/12/2020 13:57       Subjective: Patient seen and examined at bedside.  Sleepy, wakes up slightly, answers some basic questions.  Wants to go home.  Denies current chest pain, nausea,  vomiting or fever.  Discharge Exam: Vitals:   05/01/20 0341 05/01/20 0729  BP: 132/65 140/86  Pulse: 76 81  Resp: 19 20  Temp: 97.6 F (36.4 C) 97.6 F (36.4 C)  SpO2: 98% 94%    General: Pt is Sleepy, wakes up slightly, answers some basic questions.  Currently on room air.  Poor historian. Cardiovascular: rate controlled, S1/S2 + Respiratory: bilateral decreased breath sounds at bases with some scattered crackles Abdominal: Soft, morbidly obese, NT, ND, bowel sounds + Extremities: Trace lower extremity edema no cyanosis    The results of significant diagnostics from this hospitalization (including imaging, microbiology, ancillary and laboratory) are listed below for reference.     Microbiology: Recent Results (from the past 240 hour(s))  SARS Coronavirus 2 by RT PCR (hospital order, performed in [facility] hospital lab) Nasopharyngeal Nasopharyngeal Swab     Status: Abnormal   Collection Time: 04/23/20  8:15 AM   Specimen: Nasopharyngeal Swab  Result Value Ref Range Status   SARS Coronavirus 2 POSITIVE (A) NEGATIVE Final    Comment: RESULT CALLED TO, READ BACK BY AND VERIFIED WITH: SAMANTHA HAMILTON ON 04/23/20 AT 1052 QSD (NOTE) SARS-CoV-2 target nucleic acids are DETECTED  SARS-CoV-2 RNA is generally detectable in upper respiratory specimens  during the acute phase of infection.  Positive results are indicative  of the presence of the identified virus, but do not rule out bacterial infection or co-infection with other pathogens not detected by the test.  Clinical correlation with patient history and  other diagnostic information is necessary to determine patient infection status.  The expected result is negative.  Fact Sheet for Patients:   StrictlyIdeas.no   Fact Sheet for Healthcare Providers:   BankingDealers.co.za    This test is not yet approved or cleared by the [place] FDA and  has been authorized for  detection and/or diagnosis of SARS-CoV-2 by FDA under an Emergency Use Authorization (EUA).  This EUA will remain in effect (meaning t his test can be used) for the duration of  the COVID-19 declaration under Section 564(b)(1) of the Act, 21 U.S.C. section 360-bbb-3(b)(1), unless the authorization is terminated or revoked sooner.  Performed at [facility], [address] [place], [place] [address]      Labs: BNP (last 3 results) Recent Labs    04/29/20 1442  BNP [id number]*   Basic Metabolic Panel: Recent Labs  Lab 04/25/20 0458 04/29/20 1442 04/30/20 0535 05/01/20 0512  NA 136 137 137 136  K 4.1 4.6 4.4 4.4  CL 101 103 106 104  CO2 23 19* 18* 20*  GLUCOSE 130* 244* 154* 179*  BUN 25* 27* 27* 34*  CREATININE 5.30* 6.42* 7.03* 7.30*  CALCIUM 7.4* 8.2* 8.0* 7.6*  MG  --   --  2.0  --    Liver Function Tests: Recent Labs  Lab 04/29/20 1442 05/01/20 0512  AST 23 12*  ALT 11 10  ALKPHOS  97 82  BILITOT 0.6 0.4  PROT 7.9 6.6  ALBUMIN 2.9* 2.5*   No results for input(s): LIPASE, AMYLASE in the last 168 hours. Recent Labs  Lab 05/01/20 0512  AMMONIA 26   CBC: Recent Labs  Lab 04/25/20 0458 04/29/20 1442 04/30/20 0535 05/01/20 0512  WBC 5.5 5.8 7.5 6.0  NEUTROABS  --  4.2  --  3.9  HGB 10.3* 11.5* 11.9* 10.6*  HCT 32.1* 36.1* 38.8* 32.9*  MCV 88.2 88.0 90.7 88.9  PLT 171 270 265 244   Cardiac Enzymes: No results for input(s): CKTOTAL, CKMB, CKMBINDEX, TROPONINI in the last 168 hours. BNP: Invalid input(s): POCBNP CBG: Recent Labs  Lab 04/30/20 0830 04/30/20 1246 04/30/20 1729 04/30/20 2035 05/01/20 0725  GLUCAP 145* 261* 174* 231* 164*   D-Dimer No results for input(s): DDIMER in the last 72 hours. Hgb A1c No results for input(s): HGBA1C in the last 72 hours. Lipid Profile No results for input(s): CHOL, HDL, LDLCALC, TRIG, CHOLHDL, LDLDIRECT in the last 72 hours. Thyroid function studies Recent Labs    05/01/20 0512  TSH 4.766*    Anemia work up Recent Labs    05/01/20 0512  FOLATE 8.9   Urinalysis    Component Value Date/Time   COLORURINE STRAW (A) 10/28/2018 2145   APPEARANCEUR CLEAR (A) 10/28/2018 2145   LABSPEC 1.008 10/28/2018 2145   PHURINE 5.0 10/28/2018 2145   GLUCOSEU 50 (A) 10/28/2018 2145   HGBUR SMALL (A) 10/28/2018 2145   BILIRUBINUR NEGATIVE 10/28/2018 2145   KETONESUR NEGATIVE 10/28/2018 2145   PROTEINUR 100 (A) 10/28/2018 2145   NITRITE NEGATIVE 10/28/2018 2145   LEUKOCYTESUR NEGATIVE 10/28/2018 2145   Sepsis Labs Invalid input(s): PROCALCITONIN,  WBC,  LACTICIDVEN Microbiology Recent Results (from the past 240 hour(s))  SARS Coronavirus 2 by RT PCR (hospital order, performed in [place] [facility] hospital lab) Nasopharyngeal Nasopharyngeal Swab     Status: Abnormal   Collection Time: 04/23/20  8:15 AM   Specimen: Nasopharyngeal Swab  Result Value Ref Range Status   SARS Coronavirus 2 POSITIVE (A) NEGATIVE Final    Comment: RESULT CALLED TO, READ BACK BY AND VERIFIED WITH: SAMANTHA HAMILTON ON 04/23/20 AT 1052 QSD (NOTE) SARS-CoV-2 target nucleic acids are DETECTED  SARS-CoV-2 RNA is generally detectable in upper respiratory specimens  during the acute phase of infection.  Positive results are indicative  of the presence of the identified virus, but do not rule out bacterial infection or co-infection with other pathogens not detected by the test.  Clinical correlation with patient history and  other diagnostic information is necessary to determine patient infection status.  The expected result is negative.  Fact Sheet for Patients:   StrictlyIdeas.no   Fact Sheet for Healthcare Providers:   BankingDealers.co.za    This test is not yet approved or cleared by the [place] FDA and  has been authorized for detection and/or diagnosis of SARS-CoV-2 by FDA under an Emergency Use Authorization (EUA).  This EUA will remain in effect  (meaning t his test can be used) for the duration of  the COVID-19 declaration under Section 564(b)(1) of the Act, 21 U.S.C. section 360-bbb-3(b)(1), unless the authorization is terminated or revoked sooner.  Performed at [facility], [address], [place], [place] [address]      Time coordinating discharge: 35 minutes  SIGNED:   Aline August, MD  [facility] 05/01/2020, 10:58 AM

## 2020-05-01 NOTE — Plan of Care (Signed)

## 2020-05-01 NOTE — Unmapped (Signed)
Cardiac Implantable Electronic Device Remote Monitoring     Visit Date:  05/01/2020    Findings: Normal device function, Tested Lead measurements stable and within normal range, Adequate battery reserve-11 years     Arrhythmias: Multiple AF events; 5 % AF burden; currently on Eliquis. Multiple NS-VT and VT-1 episodes, and 1 VT episode with ATP and 1 shock. 1 VT episode with ATP.  EGM's appear to be runs of AF with RVR.    Plan: Continue Routine Remote Monitoring     Last seen in person:11/09/2019    Manufacturer of Device: [employer]       Type of Device: Oceanographer  See scanned/downloaded PDF report for model numbers, serial numbers, and date(s) of implant.    Presenting Rhythm: AS/VS     ______________________________________________________________________    Heart Failure Monitoring: No Evidence of Fluid Accumulation  Hx of HF.       Please see downloaded PDF file of transmission under Media Tab  for full details of device interrogation to include, when applicable,  battery status/charge time, lead trend data, and programmed parameters.

## 2020-05-01 NOTE — Unmapped (Signed)
All appointments with [facility] EP REMOTE MONITORING occur from home.  You do not come in to the office or hospital for these visits.        Your Device data report from this visit can be found in your Mychart by following the steps below.  Step #1-Select Menu then document center    Step #2-Select my documents    Step #3-Select Device checks    Please contact us with any questions.    Thank you,    [facility] EP REMOTE MONITORING [facility]  Phone. [phone]  Fax. [phone]  Email. [email or web address]

## 2020-05-02 ENCOUNTER — Ambulatory Visit: Admit: 2020-05-02 | Discharge: 2020-05-03 | Payer: MEDICARE

## 2020-05-02 DIAGNOSIS — U071 COVID-19: Principal | ICD-10-CM

## 2020-05-02 DIAGNOSIS — Z888 Allergy status to other drugs, medicaments and biological substances status: Principal | ICD-10-CM

## 2020-05-02 DIAGNOSIS — I440 Atrioventricular block, first degree: Principal | ICD-10-CM

## 2020-05-02 DIAGNOSIS — F410 Panic disorder [episodic paroxysmal anxiety] without agoraphobia: Principal | ICD-10-CM

## 2020-05-02 DIAGNOSIS — Z836 Family history of other diseases of the respiratory system: Principal | ICD-10-CM

## 2020-05-02 DIAGNOSIS — I471 Supraventricular tachycardia: Principal | ICD-10-CM

## 2020-05-02 DIAGNOSIS — Z8616 Personal history of COVID-19: Principal | ICD-10-CM

## 2020-05-02 DIAGNOSIS — Z8349 Family history of other endocrine, nutritional and metabolic diseases: Principal | ICD-10-CM

## 2020-05-02 DIAGNOSIS — G4733 Obstructive sleep apnea (adult) (pediatric): Principal | ICD-10-CM

## 2020-05-02 DIAGNOSIS — K219 Gastro-esophageal reflux disease without esophagitis: Principal | ICD-10-CM

## 2020-05-02 DIAGNOSIS — Z550 Illiteracy and low-level literacy: Principal | ICD-10-CM

## 2020-05-02 DIAGNOSIS — Z818 Family history of other mental and behavioral disorders: Principal | ICD-10-CM

## 2020-05-02 DIAGNOSIS — Z79899 Other long term (current) drug therapy: Principal | ICD-10-CM

## 2020-05-02 DIAGNOSIS — F32A Depression, unspecified: Principal | ICD-10-CM

## 2020-05-02 DIAGNOSIS — E1122 Type 2 diabetes mellitus with diabetic chronic kidney disease: Principal | ICD-10-CM

## 2020-05-02 DIAGNOSIS — Z7901 Long term (current) use of anticoagulants: Principal | ICD-10-CM

## 2020-05-02 DIAGNOSIS — E785 Hyperlipidemia, unspecified: Principal | ICD-10-CM

## 2020-05-02 DIAGNOSIS — I252 Old myocardial infarction: Principal | ICD-10-CM

## 2020-05-02 DIAGNOSIS — R0602 Shortness of breath: Principal | ICD-10-CM

## 2020-05-02 DIAGNOSIS — K760 Fatty (change of) liver, not elsewhere classified: Principal | ICD-10-CM

## 2020-05-02 DIAGNOSIS — I5022 Chronic systolic (congestive) heart failure: Principal | ICD-10-CM

## 2020-05-02 DIAGNOSIS — G894 Chronic pain syndrome: Principal | ICD-10-CM

## 2020-05-02 DIAGNOSIS — R0609 Other forms of dyspnea: Principal | ICD-10-CM

## 2020-05-02 DIAGNOSIS — Z885 Allergy status to narcotic agent status: Principal | ICD-10-CM

## 2020-05-02 DIAGNOSIS — Z45018 Encounter for adjustment and management of other part of cardiac pacemaker: Principal | ICD-10-CM

## 2020-05-02 DIAGNOSIS — Z8249 Family history of ischemic heart disease and other diseases of the circulatory system: Principal | ICD-10-CM

## 2020-05-02 DIAGNOSIS — Z833 Family history of diabetes mellitus: Principal | ICD-10-CM

## 2020-05-02 DIAGNOSIS — Z821 Family history of blindness and visual loss: Principal | ICD-10-CM

## 2020-05-02 DIAGNOSIS — Z8673 Personal history of transient ischemic attack (TIA), and cerebral infarction without residual deficits: Principal | ICD-10-CM

## 2020-05-02 DIAGNOSIS — E1142 Type 2 diabetes mellitus with diabetic polyneuropathy: Principal | ICD-10-CM

## 2020-05-02 DIAGNOSIS — G43909 Migraine, unspecified, not intractable, without status migrainosus: Principal | ICD-10-CM

## 2020-05-02 DIAGNOSIS — Z794 Long term (current) use of insulin: Principal | ICD-10-CM

## 2020-05-02 DIAGNOSIS — F17220 Nicotine dependence, chewing tobacco, uncomplicated: Principal | ICD-10-CM

## 2020-05-02 DIAGNOSIS — R042 Hemoptysis: Principal | ICD-10-CM

## 2020-05-02 DIAGNOSIS — R918 Other nonspecific abnormal finding of lung field: Principal | ICD-10-CM

## 2020-05-02 DIAGNOSIS — I517 Cardiomegaly: Principal | ICD-10-CM

## 2020-05-02 DIAGNOSIS — Z83438 Family history of other disorder of lipoprotein metabolism and other lipidemia: Principal | ICD-10-CM

## 2020-05-02 DIAGNOSIS — I132 Hypertensive heart and chronic kidney disease with heart failure and with stage 5 chronic kidney disease, or end stage renal disease: Principal | ICD-10-CM

## 2020-05-02 DIAGNOSIS — N186 End stage renal disease: Principal | ICD-10-CM

## 2020-05-02 DIAGNOSIS — Z992 Dependence on renal dialysis: Principal | ICD-10-CM

## 2020-05-02 DIAGNOSIS — Z808 Family history of malignant neoplasm of other organs or systems: Principal | ICD-10-CM

## 2020-05-02 DIAGNOSIS — I25119 Atherosclerotic heart disease of native coronary artery with unspecified angina pectoris: Principal | ICD-10-CM

## 2020-05-02 DIAGNOSIS — R0600 Dyspnea, unspecified: Principal | ICD-10-CM

## 2020-05-02 LAB — COMPREHENSIVE METABOLIC PANEL
ALBUMIN: 2.7 g/dL — ABNORMAL LOW (ref 3.4–5.0)
ALKALINE PHOSPHATASE: 110 U/L (ref 46–116)
ALT (SGPT): 7 U/L — ABNORMAL LOW (ref 10–49)
ANION GAP: 9 mmol/L (ref 5–14)
AST (SGOT): 16 U/L (ref <=34)
BILIRUBIN TOTAL: 0.2 mg/dL — ABNORMAL LOW (ref 0.3–1.2)
BLOOD UREA NITROGEN: 24 mg/dL — ABNORMAL HIGH (ref 9–23)
BUN / CREAT RATIO: 4
CALCIUM: 8.1 mg/dL — ABNORMAL LOW (ref 8.7–10.4)
CHLORIDE: 107 mmol/L (ref 98–107)
CO2: 21.6 mmol/L (ref 20.0–31.0)
CREATININE: 5.85 mg/dL — ABNORMAL HIGH
EGFR CKD-EPI AA MALE: 12 mL/min/{1.73_m2} — ABNORMAL LOW (ref >=60)
EGFR CKD-EPI NON-AA MALE: 10 mL/min/{1.73_m2} — ABNORMAL LOW (ref >=60)
GLUCOSE RANDOM: 128 mg/dL (ref 70–179)
POTASSIUM: 4.3 mmol/L (ref 3.4–4.5)
PROTEIN TOTAL: 7.1 g/dL (ref 5.7–8.2)
SODIUM: 138 mmol/L (ref 135–145)

## 2020-05-02 LAB — PHOSPHORUS: PHOSPHORUS: 2.8 mg/dL (ref 2.4–5.1)

## 2020-05-02 LAB — CBC W/ AUTO DIFF
BASOPHILS ABSOLUTE COUNT: 0.1 10*9/L (ref 0.0–0.1)
BASOPHILS RELATIVE PERCENT: 1.1 %
EOSINOPHILS ABSOLUTE COUNT: 0.2 10*9/L (ref 0.0–0.7)
EOSINOPHILS RELATIVE PERCENT: 2.2 %
HEMATOCRIT: 34.8 % — ABNORMAL LOW (ref 38.0–50.0)
HEMOGLOBIN: 11.2 g/dL — ABNORMAL LOW (ref 13.5–17.5)
LYMPHOCYTES ABSOLUTE COUNT: 1.3 10*9/L (ref 0.7–4.0)
LYMPHOCYTES RELATIVE PERCENT: 16.8 %
MEAN CORPUSCULAR HEMOGLOBIN CONC: 32.3 g/dL (ref 30.0–36.0)
MEAN CORPUSCULAR HEMOGLOBIN: 28 pg (ref 26.0–34.0)
MEAN CORPUSCULAR VOLUME: 86.5 fL (ref 81.0–95.0)
MEAN PLATELET VOLUME: 7.7 fL (ref 7.0–10.0)
MONOCYTES ABSOLUTE COUNT: 0.8 10*9/L (ref 0.1–1.0)
MONOCYTES RELATIVE PERCENT: 9.8 %
NEUTROPHILS ABSOLUTE COUNT: 5.5 10*9/L (ref 1.7–7.7)
NEUTROPHILS RELATIVE PERCENT: 70.1 %
NUCLEATED RED BLOOD CELLS: 0 /100{WBCs} (ref <=4)
PLATELET COUNT: 331 10*9/L (ref 150–450)
RED BLOOD CELL COUNT: 4.02 10*12/L — ABNORMAL LOW (ref 4.32–5.72)
RED CELL DISTRIBUTION WIDTH: 16.9 % — ABNORMAL HIGH (ref 12.0–15.0)
WBC ADJUSTED: 7.8 10*9/L (ref 3.5–10.5)

## 2020-05-02 LAB — PROTIME-INR
INR: 1.13
PROTIME: 13.2 s (ref 10.3–13.4)

## 2020-05-02 LAB — MAGNESIUM: MAGNESIUM: 2 mg/dL (ref 1.6–2.6)

## 2020-05-02 LAB — B-TYPE NATRIURETIC PEPTIDE: B-TYPE NATRIURETIC PEPTIDE: 824.36 pg/mL — ABNORMAL HIGH (ref <=100)

## 2020-05-02 LAB — HIGH SENSITIVITY TROPONIN I - 2 HOUR SERIAL
HIGH SENSITIVITY TROPONIN - DELTA (0-2H): 4 ng/L (ref <=7)
HIGH-SENSITIVITY TROPONIN I - 2 HOUR: 33 ng/L (ref <=53)

## 2020-05-02 LAB — HIGH SENSITIVITY TROPONIN I - SERIAL: HIGH SENSITIVITY TROPONIN I: 29 ng/L (ref <=53)

## 2020-05-03 ENCOUNTER — Emergency Department: Admit: 2020-05-03 | Discharge: 2020-05-03 | Disposition: A | Discharge status: Home / Self Care | Payer: MEDICARE

## 2020-05-03 ENCOUNTER — Ambulatory Visit: Admit: 2020-05-03 | Discharge: 2020-05-03 | Disposition: A | Discharge status: Home / Self Care | Payer: MEDICARE

## 2020-05-03 NOTE — Unmapped (Signed)
Pt arrives with c/o sob chronic but getting worse yesterday.  Pt diagnosed with covid 04/23/20 and had infusion on last wed.   Pt also dialysis pt with heart condition.    Pt sent from UC for further cardiac workup.

## 2020-05-03 NOTE — Unmapped (Signed)
[facility]  Emergency Department Provider Note      ED Clinical Impression      Final diagnoses:   Paroxysmal nocturnal dyspnea (Primary)         Impression, ED Course, Assessment and Plan      Impression: 50 year old male with extensive medical history including cardiovascular history significant for multiple cardiac arrests, CAD, atrial fibrillation, CHF, NSTEMI, angina, hypertension, diabetes, hyperlipidemia, ESRD on HD who presents for evaluation of paroxysmal nocturnal dyspnea as described in detail below.  Discharged yesterday from [facility] after admission for chest pain and dyspnea.  His device was interrogated, which showed some atrial tachycardia.  He was to follow-up with his cardiologist as an outpatient.  He also was diagnosed with COVID-19 recently but has been doing quite well from a symptom standpoint regarding his COVID-19.  Last HD session was yesterday, and patient reports this was a full session.  He currently reports that he presents today because he has been waking up feeling anxious and like he is having trouble catching his breath during the night.  He denies any orthopnea, other dyspnea, chest pain, palpitations, or any other acute complaints as below.  On initial evaluation in the ED, he is well-appearing and in no acute distress.  Vitals here with no fever, tachycardia, or hypoxia.  On exam, he appears quite well with clear lungs bilaterally, unremarkable cardiac exam, soft and nontender abdomen.  Dialysis access in place to the right upper chest wall with no surrounding erythema or drainage.  There is no clinical evidence of volume overload.  Will check basic labs, chest x-ray, ECG, troponin to evaluate for evidence of ACS, though I have low suspicion for the same at this time given his lack of chest pain.  Do not suspect PE, as he is currently anticoagulated on Eliquis.  Suspect that if his work-up here is appropriate, he will likely be appropriate for outpatient cardiology and PCP follow-up.    ED Course as of 05/04/20 2358   Wed May 02, 2020   2254 BNP is 824, which appears to be chronically elevated, and there is no clinical evidence of volume overload at this time.  No evidence of leukocytosis or significant anemia.  Magnesium and phosphorus are normal.  Creatinine 5.85 with BUN of 24.  High-sensitivity troponin is 29.  Will check delta, though I have low suspicion for ACS at this time.  Chest x-ray with bibasilar infiltrates consistent with known COVID-19 infection, likely with some superimposed edema, which appears to be baseline.   2255 Repeat high-sensitivity troponin is 33.  With delta of 4, particularly given low concern for ACS, I do not expect acute ischemia at this time, as I suspect his elevated troponin is likely due to ESRD.  Patient due to be dialyzed in the morning.  I feel it is reasonable to have patient follow-up with his primary care physician as well as cardiologist, particularly given his recent admission with interrogation of his cardiac device.  No clinical evidence of frank volume overload at this time.  Strict return precautions provided.          Additional Medical Decision Making     I have reviewed the vital signs and the nursing notes. Labs and radiology results that were available during my care of the patient were independently reviewed by me and considered in my medical decision making.     I discussed the case with Dr. Izola Price, the ED attending physician.  Portions of this record have been created using [employer]. Dictation errors have been sought, but these [employer] not have been identified and corrected.   ____________________________________________     History        Chief Complaint  Shortness of Breath      HPI   Jeremy Oliver is a 50 y.o. male with PMH of CAD with stable angina s/p PCI placement to LMCA and LAD, atrial fibrillation (On Eliquis and Amiodarone), HFrEF (20-25% in 01/2020), type 2 diabetes mellitus (on insulin), non-alcoholic liver disease, chronic pain syndrome, ESRD (HD on [name]), HLD, HTN, NSTEMI, OSA, and GERD presenting today for shortness of breath. Patient reports that since leaving the hospital yesterday he had some episodes overnight feeling panicked and short of breath. No shortness of breath laying flat or at rest. No chest pain, palpitations, vomiting, diarrhea, BLE swelling/abdomen, fever, or cough. He had a full session of dialysis yesterday. He is scheduled for dialysis tomorrow. No fever or cough. He is a non smoker.     Per chart review, patient was seen at [facility] ED 3 days ago (04/29/20).     Summary of visit is below:     [employer] pacemaker placement, recent admission from 04/23/2020-04/25/2020 for unresponsiveness/seizure-like activity/cardiac syncope with work-up revealing normal EEG and brain imaging and neurology stating that this is not seizure and pseudoseizure was in differential diagnosis. He had also tested positive for COVID-19 during the same admission but had no respiratory symptoms. Patient presented on 04/30/2020 again with multiple episodes of syncope with seizure-like activity and also had short lasting unresponsiveness in ED. Patient had missed his dialysis day prior to presentation. On presentation, pacemaker interrogation was performed in the ED showing atrial rate of more than 250. He was on room air and chest x-ray showed cardiomegaly without infiltrates. CT head was negative. Cardiology and neurology were consulted. During the hospitalization, heart rate remained stable. Cardiology recommended continued current medical treatment with outpatient follow-up with regular cardiologist. Neurology has cleared the patient for discharge with recommendations for outpatient follow-up with regular neurologist. Nephrology was also consulted. He will be discharged home with home health PT after dialysis today.    Past Medical History:   Diagnosis Date   ??? Angina at rest (CMS-[name])    ??? Arthritis    ??? Asthma    ??? Atrial fibrillation and flutter (CMS-[name]) 06/08/14   ??? BMI 40.0-44.9, adult (CMS-[name]) 10/29/2015   ??? Chronic anticoagulation 02/17/2018   ??? Chronic pain syndrome 04/11/2014    ~Use daily lyrica as recommended ~Tramadol as needed for pain ~Fluoxetine daily as recommended ~Daily exercise ~Eat a healthy diet ~Good control of your blood sugars can help    ??? Coronary artery disease    ??? Depression    ??? Diabetes mellitus (CMS-[name])    ??? ESRD (end stage renal disease) on dialysis (CMS-[name])    ??? Eye trauma 1998    glass in both eyes and removed   ??? GERD (gastroesophageal reflux disease)    ??? Gout    ??? Homeless    ??? Hyperlipidemia    ??? Hypertension    ??? Illiteracy and low-level literacy    ??? Microscopic hematuria    ??? Migraine    ??? Nephrolithiasis    ??? 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 Nonalcoholic fatty liver disease    ??? NSTEMI (non-ST elevated myocardial infarction) (CMS-[name]) 01/13/2017   ??? Obesity    ??? Obstructive sleep apnea 2009   ??? Panic attacks    ??? 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 Seizure-like activity (CMS-[name])     ~Monitor for symptoms and report them to your primary care provider if they occur    ??? Systolic CHF, chronic (CMS-[name])     EF 40-45% 2017   ??? TIA (transient ischemic attack)        Patient Active Problem List   Diagnosis   ??? 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 Idiopathic chronic gout of left knee   ??? Hypercholesteremia   ??? Diabetes mellitus with coincident hypertension (CMS-[name])   ??? 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 Osteoarthritis   ??? Chronic nonalcoholic liver disease   ??? GERD (gastroesophageal reflux disease)   ??? Mild intermittent asthma without complication   ??? Chronic pain syndrome   ??? Paroxysmal atrial fibrillation (CMS-[name])   ??? OSA (obstructive sleep apnea)   ??? Tobacco use disorder   ??? Status post cardiac pacemaker procedure   ??? Diabetes mellitus with gastroparesis (CMS-[name])   ??? Cardiac pacemaker in situ   ??? Recurrent major depressive disorder, in full remission (CMS-[name])   ??? NSTEMI (non-ST elevation myocardial infarction) (CMS-[name])   ??? Mixed anxiety and depressive disorder   ??? HFrEF (heart failure with reduced ejection fraction) (CMS-[name])   ??? Hyperkalemia   ??? Acquired left foot drop   ??? Decreased sensation of lower extremity, left   ??? Chronic anticoagulation   ??? Type 2 diabetes mellitus with stage 4 chronic kidney disease, with long-term current use of insulin (CMS-[name])   ??? Situational stress   ??? Cough   ??? QT prolongation   ??? ESRD needing dialysis (CMS-[name])   ??? Palliative care by specialist   ??? Angina pectoris (CMS-[name])   ??? Psychophysiological insomnia   ??? COVID-19   ??? Acute respiratory failure with hypoxia (CMS-[name])   ??? Anemia in chronic kidney disease   ??? Chronic systolic CHF (congestive heart failure) (CMS-[name])   ??? Gout   ??? Iron deficiency anemia   ??? Coagulation defect, unspecified (CMS-[name])   ??? Non-ST elevation (NSTEMI) myocardial infarction (CMS-[name])   ??? Dilated cardiomyopathy (CMS-[name])       Past Surgical History:   Procedure Laterality Date   ??? CARDIAC CATHETERIZATION  03/08/2007   ??? CORONARY STENT PLACEMENT     ??? CYSTOURETHROSCOPY  12/31/2009     Microscopic hematuria   ??? KNEE SURGERY Right 09/23/2006    arthroscopic knee surgery medial and lateral meniscus tears as well as crystal disease   ??? PR CATH PLACE/CORON ANGIO, IMG SUPER/INTERP,R&L HRT CATH, L HRT VENTRIC N/A 02/13/2017    Procedure: Left/Right Heart Catheterization;  Surgeon: Marlaine Hind, MD;  Location: [facility] CATH;  Service: Cardiology   ??? PR CATH PLACE/CORON ANGIO, IMG SUPER/INTERP,W LEFT HEART VENTRICULOGRAPHY N/A 03/17/2017    Procedure: Left Heart Catheterization W Intervention;  Surgeon: Marlaine Hind, MD;  Location: [facility] CATH;  Service: Cardiology   ???  PR CATH PLACE/CORON ANGIO, IMG SUPER/INTERP,W LEFT HEART VENTRICULOGRAPHY N/A 05/12/2018    Procedure: CATH LEFT HEART CATHETERIZATION W INTERVENTION;  Surgeon: Dorathy Kinsman, MD;  Location: [facility] CATH;  Service: Cardiology   ??? PR CATH PLACE/CORON ANGIO, IMG SUPER/INTERP,W LEFT HEART VENTRICULOGRAPHY N/A 10/22/2018    Procedure: CATH LEFT HEART CATHETERIZATION W INTERVENTION;  Surgeon: Marlaine Hind, MD;  Location: [facility] CATH;  Service: Cardiology   ??? PR CATH PLACE/CORON ANGIO, IMG SUPER/INTERP,W LEFT HEART VENTRICULOGRAPHY N/A 08/19/2019    Procedure: Left Heart Catheterization W Intervention;  Surgeon: Marlaine Hind, MD;  Location: [facility] CATH;  Service: Cardiology   ??? PR CATH PLACE/CORON ANGIO, IMG SUPER/INTERP,W LEFT HEART VENTRICULOGRAPHY N/A 03/27/2020    Procedure: CATH LEFT HEART CATHETERIZATION W INTERVENTION;  Surgeon: Marlaine Hind, MD;  Location: [facility] CATH;  Service: Cardiology   ??? PR COMPRE EP EVAL ABLTJ 3D MAPG TX SVT N/A 07/19/2015    Catheter ablation of cavotricuspid isthmus for atrial flutter [facility])   ??? PR ECMO/ECLS INITIATION VENO-ARTERIAL N/A 03/19/2017    Procedure: ECMO/ECLS; INITIATION, VENO-ARTERIAL;  Surgeon: Lennie Odor, MD;  Location: MAIN OR [facility];  Service: Cardiac Surgery   ??? PR ECMO/ECLS RMVL PRPH CANNULA OPEN 6 YRS & OLDER Left 03/25/2017    Procedure: ECMO / ECLS PROVIDED BY PHYSICIAN; REMOVAL OF PERIPHERAL (ARTERIAL AND/OR VENOUS) CANNULA(E), OPEN, 6 YEARS AND OLDER;  Surgeon: Alonna Buckler Ikonomidis, MD;  Location: MAIN OR [facility];  Service: Cardiac Surgery   ??? PR INSER HART PACER XVENOUS ATRIAL N/A 05/30/2015    [employer] dual-chamber pacemaker implant (E.Chung)   ??? PR INSERT/PLACE FLOW DIRECT CATH N/A 03/17/2017    Procedure: Insert Leave In [place];  Surgeon: Marlaine Hind, MD;  Location: [facility] CATH;  Service: Cardiology   ??? PR INSJ/RPLCMT PERM DFB W/TRNSVNS LDS 1/DUAL CHMBR N/A 05/04/2017    Procedure: ICD Implant System (Single/Dual);  Surgeon: Eldred Manges, MD;  Location: [facility] CATH;  Service: Cardiology   ??? PR NEGATIVE PRESSURE WOUND THERAPY DME >50 SQ CM Left 03/25/2017    Procedure: Neg Press Wound Tx (Vac Assist) Incl Topicals, Per Session, Tsa Greater Than/= 50 Cm Squared;  Surgeon: Alonna Buckler Ikonomidis, MD;  Location: MAIN OR [facility];  Service: Cardiac Surgery   ??? PR PRQ TRLUML CORONARY STENT W/ANGIO ONE ART/BRNCH N/A 03/12/2018    Procedure: Percutaneous Coronary Intervention;  Surgeon: Marlaine Hind, MD;  Location: [facility] CATH;  Service: Cardiology   ??? PR PRQ TRLUML CORONARY STENT W/ANGIO ONE ART/BRNCH N/A 01/27/2020    Procedure: Percutaneous Coronary Intervention;  Surgeon: Marlaine Hind, MD;  Location: [facility] CATH;  Service: Cardiology   ??? PR REBL VES DIRECT,LOW EXTREM Left 03/19/2017    Procedure: Repr Bld Vessel Direct; Lower Extrem;  Surgeon: Boykin Reaper, MD;  Location: MAIN OR [facility];  Service: Vascular   ??? PR REMV ART CLOT ILIAC-POP,LEG INCIS Left 03/25/2017    Procedure: EMBOLECTOMY OR THROMBECTOMY, WITH OR WITHOUT CATHETER; FEMOROPOPLITEAL, AORTOILIAC ARTERY, BY LEG INCISION;  Surgeon: Alonna Buckler Ikonomidis, MD;  Location: MAIN OR [facility];  Service: Cardiac Surgery   ??? PR UPPER GI ENDOSCOPY,BIOPSY N/A 02/28/2016    Procedure: UGI ENDOSCOPY; WITH BIOPSY, SINGLE OR MULTIPLE;  Surgeon: Liane Comber, MD;  Location: [facility];  Service: Gastroenterology   ??? PR UPPER GI ENDOSCOPY,DIAGNOSIS N/A 05/07/2016    Procedure: UGI ENDO, INCLUDE ESOPHAGUS, STOMACH, & DUODENUM &/OR JEJUNUM; DX W/WO COLLECTION SPECIMN, BY BRUSH OR WASH;  Surgeon: Modena Nunnery, MD;  Location: [facility];  Service: Gastroenterology       No current facility-administered medications for this encounter.    Current Outpatient Medications:   ???  acetaminophen (TYLENOL) 500 MG tablet, TAKE 2 TABLETS (1000MG ) BY MOUTH EVERY 8 HOURS AS NEEDED FOR PAIN, Disp: 180 tablet, Rfl: 0  ???  albuterol HFA 90 mcg/actuation inhaler, Inhale 2 puffs into the lungs every 6 (six) hours as needed for wheezing or shortness of breath., Disp: 18 g, Rfl: 2  ???  alirocumab (PRALUENT) 150 mg/mL subcutaneous injection, Inject the contents of 1 pen (150 mg total) under the skin every fourteen (14) days., Disp: 6 mL, Rfl: 3  ???  allopurinoL (ZYLOPRIM) 100 MG tablet, Take 2 tablets (200 mg total) by mouth daily. (Patient not taking: Reported on 05/02/2020), Disp: 180 tablet, Rfl: 0  ???  amiodarone (PACERONE) 200 MG tablet, TAKE 1 TABLET BY MOUTH ONCE DAILY, Disp: 90 tablet, Rfl: 3  ???  amiodarone (PACERONE) 200 MG tablet, Take 1 tablet (200 mg total) by mouth 2 (two) times daily., Disp: 60 tablet, Rfl: 1  ???  apixaban (ELIQUIS) 5 mg Tab, Take 1 tablet (5 mg total) by mouth Two (2) times a day., Disp: 60 tablet, Rfl: 11  ???  atorvastatin (LIPITOR) 80 MG tablet, Take 1 tablet (80 mg total) by mouth daily., Disp: 90 tablet, Rfl: 3  ???  back brace Misc, 1 each by Miscellaneous route daily as needed. (Patient not taking: Reported on 05/02/2020), Disp: 1 each, Rfl: 0  ???  benzonatate (TESSALON PERLES) 100 MG capsule, Take 1 capsule (100 mg total) by mouth Three (3) times a day as needed for cough., Disp: 30 capsule, Rfl: 1  ???  blood sugar diagnostic Strp, Use as directed to check blood glucose Four (4) times a day (before meals and nightly)., Disp: 300 each, Rfl: 3  ???  blood-glucose meter kit, Disp. blood glucose meter kit preferred by patient's insurance. Dx: Diabetes, E11.9, Disp: 1 each, Rfl: 11  ???  bumetanide (BUMEX) 2 MG tablet, Take 1 tablet (2 mg total) by mouth two (2) times a day. On non dialysis days, Disp: 180 tablet, Rfl: 3  ???  cholecalciferol, vitamin D3 25 mcg, 1,000 units,, 1,000 unit (25 mcg) tablet, Take 1 tablet (25 mcg total) by mouth daily., Disp: 100 tablet, Rfl: 11  ???  diclofenac sodium (VOLTAREN) 1 % gel, Apply 2 grams topically Three (3) times a day., Disp: 180 g, Rfl: 11  ???  empty container (SHARPS-A-GATOR DISPOSAL SYSTEM) Misc, Use as directed for sharps disposal, Disp: 1 each, Rfl: 2  ???  ezetimibe (ZETIA) 10 mg tablet, TAKE 1 TABLET BY MOUTH ONCE DAILY, Disp: 90 tablet, Rfl: 3  ???  FLUoxetine (PROZAC) 40 MG capsule, Take 2 capsules (80 mg total) by mouth daily., Disp: 180 capsule, Rfl: 0  ???  gabapentin (NEURONTIN) 100 MG capsule, Take 1 capsule (100 mg total) by mouth 2 (two) times daily., Disp: 30 capsule, Rfl: 0  ???  gabapentin (NEURONTIN) 300 MG capsule, Take 1 capsule (300 mg total) by mouth two (2) times a day., Disp: 180 capsule, Rfl: 2  ???  insulin ASPART (NOVOLOG FLEXPEN U-100 INSULIN) 100 unit/mL (3 mL) injection pen, Inject 15 Units under the skin Three (3) times a day before meals., Disp: 45 mL, Rfl: 3  ???  insulin detemir U-100 (LEVEMIR FLEXTOUCH U-100 INSULN) 100 unit/mL (3 mL) injection pen, Inject 30 units under the skin nightly. (Patient taking differently: Inject 35 Units  under the skin nightly. ), Disp: 15 mL, Rfl: 3  ???  insulin syringe-needle U-100 0.3 mL 31 gauge x 5/16 Syrg, Use to inject insulin twice daily with meals, Disp: 60 each, Rfl: 0  ???  isosorbide mononitrate (IMDUR) 60 MG 24 hr tablet, Take 1 tablet (60 mg total) by mouth daily. DO NOT TAKE on dialysis days, Disp: 90 tablet, Rfl: 3  ???  lancets Misc, Use as directed to check blood glucose Four (4) times a day (before meals and nightly)., Disp: 300 each, Rfl: 3  ???  lancets Misc, Disp. lancets #200 or amount allowed, Testing QID, Dx: E11.65, Z79.4. (DM type 2 , Insulin Dep), Disp: 200 each, Rfl: 11  ???  loperamide (IMODIUM) 2 mg capsule, Take 1 capsule twice a day and another capsule after each episode of diarrhea, up to 8 pills per day., Disp: 240 capsule, Rfl: 2  ???  metoprolol succinate (TOPROL XL) 100 MG 24 hr tablet, Take 1 tablet (100 mg total) by mouth daily., Disp: 90 tablet, Rfl: 3  ???  mirtazapine (REMERON) 7.5 MG tablet, Take 1 tablet (7.5 mg total) by mouth nightly., Disp: 30 tablet, Rfl: 0  ???  miscellaneous medical supply (BLOOD PRESSURE CUFF) Misc, Measure BP once a day or if any symptoms, Disp: 1 each, Rfl: 0  ???  nitroglycerin (NITROLINGUAL) 0.4 mg/dose spray, Place 1 spray under the tongue every five (5) minutes as needed for chest pain. pT MUST HAVE SPRAY DUE TO DRY MOUTH AND PILLS DO NOT DISSOLVE, Disp: 12 g, Rfl: 12  ???  omeprazole (PRILOSEC) 40 MG capsule, Take 1 capsule (40 mg total) by mouth Two (2) times a day (30 minutes before a meal)., Disp: 180 capsule, Rfl: 3  ???  ondansetron (ZOFRAN) 4 MG tablet, Take 1 tablet (4 mg total) by mouth every twelve (12) hours as needed., Disp: 15 tablet, Rfl: 0  ???  ranolazine (RANEXA) 500 MG 12 hr tablet, Take 1 tablet (500 mg total) by mouth Two (2) times a day., Disp: 60 tablet, Rfl: 6  ???  semaglutide (OZEMPIC) 0.25 mg or 0.5 mg(2 mg/1.5 mL) PnIj injection, Inject 0.5 mg under the skin every seven (7) days., Disp: 4.5 mL, Rfl: 3  ???  sevelamer (RENVELA) 800 mg tablet, Take 3 tablets (2,400 mg total) by mouth Three (3) times a day with a meal., Disp: 270 tablet, Rfl: 11  ???  ticagrelor (BRILINTA) 90 mg Tab, TAKE 1 TABLET BY MOUTH TWICE DAILY, Disp: 180 tablet, Rfl: 0  ???  UNIFINE PENTIPS 31 gauge x 5/16 (8 mm) Ndle, USE WITH INSULIN AND VICTOZA 5 TIMES DAILY, Disp: 400 each, Rfl: 3    Allergies  Losartan and Morphine    Family History   Problem Relation Age of Onset   ??? Diabetes Mother    ??? Hyperlipidemia Mother    ??? Heart disease Mother    ??? Basal cell carcinoma Mother    ??? Blindness Mother         diabeties   ??? Obesity Mother    ??? Hyperlipidemia Father    ??? Heart disease Father    ??? Lung disease Father    ??? Heart disease Half-Sister    ??? Kidney disease Half-Sister    ??? Gallbladder disease Half-Brother    ??? Obesity Half-Brother    ??? No Known Problems Daughter    ??? No Known Problems Son    ??? Obesity Daughter    ??? Depression Maternal Uncle  Died by suicide in 2019 by gunshot   ??? Melanoma Neg Hx    ??? Squamous cell carcinoma Neg Hx    ??? Macular degeneration Neg Hx    ??? Glaucoma Neg Hx        Social History  Social History     Tobacco Use   ??? Smoking status: Never Smoker   ??? Smokeless tobacco: Current User     Types: Chew   ??? Tobacco comment: 04/10/19:  Dips 3x a day./ /Pt in process of reducing tobacco use,  dips 1 can every 3 weeks,   Vaping Use   ??? Vaping Use: Never used   Substance Use Topics   ??? Alcohol use: Yes     Alcohol/week: 0.0 standard drinks     Comment: rare use: couple times a year, small amount   ??? Drug use: Never       Review of Systems    Constitutional: Negative for fever.  Cardiovascular: Negative for chest pain.  Respiratory: Negative for cough. Positive for shortness of breath.  Gastrointestinal: Negative for abdominal pain, vomiting, diarrhea.    All other systems reviewed and are negative except as noted in HPI.      Physical Exam     ED Triage Vitals [05/02/20 1945]   Enc Vitals Group      BP 166/103      Heart Rate 75      SpO2 Pulse       Resp 24      Temp 36.7 ??C (98.1 ??F)      Temp Source Oral      SpO2 96 %      Weight       Height        Constitutional: Alert and answering questions appropriately. Well appearing and in no acute distress.  Eyes: Conjunctivae are normal.   ENT       Head: Normocephalic and atraumatic.       Neck: No stridor.  Cardiovascular: Normal rate, regular rhythm. Normal S1, S2. No murmurs, rubs, or gallops.  Extremities warm, well-perfused, without edema.  Respiratory: Normal respiratory effort. Breath sounds are clear to auscultation bilaterally.  Gastrointestinal: Soft, non-distended, non-tender.  Genitourinary: Deferred.   Musculoskeletal: No obvious effusions, swelling, or long bone deformity.   Neurologic: Normal speech and language. Moves all extremities spontaneously. No gross focal neurologic deficits are appreciated.  Skin: Skin is warm, dry and intact. No rash noted.  Dialysis catheter in place to the right upper chest wall with no surrounding erythema or drainage.  Psychiatric: Mood and affect are normal. Speech and behavior are normal.     EKG     ECG of 05/02/2020 8:33 PM???sinus rhythm with first-degree AV block at a rate of 71, normal axis, QTC 512, otherwise unremarkable intervals.  T wave inversions in I and aVL are improved from prior.  No ST elevation.  No evidence of arrhythmia.     Radiology     ECG 12 lead (Adult)    Result Date: 05/03/2020  SINUS RHYTHM WITH 1ST DEGREE AV BLOCK VOLTAGE CRITERIA FOR LEFT VENTRICULAR HYPERTROPHY T WAVE ABNORMALITY, CONSIDER LATERAL ISCHEMIA PROLONGED QT ABNORMAL ECG WHEN COMPARED WITH ECG OF 28-Mar-2020 16:19, SIGNIFICANT CHANGES HAVE OCCURRED Confirmed by Pollyann Kennedy ([id number]) on 05/03/2020 8:17:42 PM    XR Chest Portable    Result Date: 05/02/2020  EXAM: XR CHEST PORTABLE DATE: 05/02/2020 ACCESSION: [id number] UN DICTATED: 05/02/2020 8:30 PM INTERPRETATION LOCATION: [facility]     CLINICAL INDICATION:  50 year old Male with DYSPNEA      COMPARISON: CXR 03/03/2020     TECHNIQUE: Portable Chest Radiograph.     FINDINGS: Left chest wall electrocardiac device. Right IJ approach HemoSplit catheter tip overlies the right atrium.     Low lung volumes with hypoventilatory changes. Pulmonary vascular congestion. Hazy bibasilar opacities.     No pleural effusion or pneumothorax.     Stable enlarged cardiomediastinal silhouette. Remote left rib fractures.             Nonspecific bibasilar hazy opacities. Differential includes atelectasis, edema, and possible but less likely mild changes of viral pneumonia given patient's history of Covid.     Similar degree of pulmonary edema.         Procedures     Documentation assistance was provided by Andrey Farmer, Scribe on May 02, 2020 at 8:01 PM for Lisa Roca, MD    Documentation assistance was provided by the scribe in my presence.  The documentation recorded by the scribe has been reviewed by me and accurately reflects the services I personally performed.    Resa Miner. Ernst Bowler, MD, M. Renae Fickle  Chief Resident, [facility] Emergency Medicine  Phone: [phone]  Pager: [phone]        Lisa Roca, MD  Resident  05/05/20 [phone]
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
## 2020-05-03 NOTE — Unmapped (Addendum)
Discussed this could be a combination of covid, anxiety, and blood tinge from vessel in throat irritation/post nasal but given complicated history sent to [facility] ED for further evaluation and management.   Patient Education        Shortness of Breath: Care Instructions  Your Care Instructions     Shortness of breath has many causes. Sometimes conditions such as anxiety can lead to shortness of breath. Some people get mild shortness of breath when they exercise. Trouble breathing also can be a symptom of a serious problem, such as asthma, lung disease, emphysema, heart problems, and pneumonia.  If your shortness of breath continues, you Laszlo need tests and treatment. Watch for any changes in your breathing and other symptoms.  Follow-up care is a key part of your treatment and safety. Be sure to make and go to all appointments, and call your doctor if you are having problems. It's also a good idea to know your test results and keep a list of the medicines you take.  How can you care for yourself at home?  ?? Do not smoke or allow others to smoke around you. If you need help quitting, talk to your doctor about stop-smoking programs and medicines. These can increase your chances of quitting for good.  ?? Get plenty of rest and sleep.  ?? Take your medicines exactly as prescribed. Call your doctor if you think you are having a problem with your medicine.  ?? Find healthy ways to deal with stress.  ? Exercise daily.  ? Get plenty of sleep.  ? Eat regularly and well.  When should you call for help?   Call 911 anytime you think you Speirs need emergency care. For example, call if:  ?? ?? You have severe shortness of breath.   ?? ?? You have symptoms of a heart attack. These [place] include:  ? Chest pain or pressure, or a strange feeling in the chest.  ? Sweating.  ? Shortness of breath.  ? Nausea or vomiting.  ? Pain, pressure, or a strange feeling in the back, neck, jaw, or upper belly or in one or both shoulders or arms.  ? Lightheadedness or sudden weakness.  ? A fast or irregular heartbeat.  After you call 911, the operator Barsch tell you to chew 1 adult-strength or 2 to 4 low-dose aspirin. Wait for an ambulance. Do not try to drive yourself.   Call your doctor now or seek immediate medical care if:  ?? ?? Your shortness of breath gets worse or you start to wheeze. Wheezing is a high-pitched sound when you breathe.   ?? ?? You wake up at night out of breath or have to prop your head up on several pillows to breathe.   ?? ?? You are short of breath after only light activity or while at rest.   Watch closely for changes in your health, and be sure to contact your doctor if:  ?? ?? You do not get better over the next 1 to 2 days.   Where can you learn more?  Go to [facility] at [email or web address] forces logistics/support/administrative officer in the Menu. Enter S780 in the search box to learn more about Shortness of Breath: Care Instructions.  Current as of: October 04, 2019??????????????????????????????Content Version: 13.1  ?? 2006-2021 [employer].   Care instructions adapted under license by [facility]. If you have questions about a medical condition or this instruction, always ask your healthcare professional. [employer] disclaims any  warranty or liability for your use of this information.

## 2020-05-04 DIAGNOSIS — Z09 Encounter for follow-up examination after completed treatment for conditions other than malignant neoplasm: Principal | ICD-10-CM

## 2020-05-06 ENCOUNTER — Ambulatory Visit: Admit: 2020-05-06 | Discharge: 2020-05-07 | Disposition: A | Discharge status: Home / Self Care | Payer: MEDICARE

## 2020-05-06 ENCOUNTER — Emergency Department: Admit: 2020-05-06 | Discharge: 2020-05-07 | Disposition: A | Discharge status: Home / Self Care | Payer: MEDICARE

## 2020-05-06 DIAGNOSIS — Z20822 Contact with and (suspected) exposure to covid-19: Principal | ICD-10-CM

## 2020-05-06 DIAGNOSIS — G894 Chronic pain syndrome: Principal | ICD-10-CM

## 2020-05-06 DIAGNOSIS — M1990 Unspecified osteoarthritis, unspecified site: Principal | ICD-10-CM

## 2020-05-06 DIAGNOSIS — E1142 Type 2 diabetes mellitus with diabetic polyneuropathy: Principal | ICD-10-CM

## 2020-05-06 DIAGNOSIS — E1122 Type 2 diabetes mellitus with diabetic chronic kidney disease: Principal | ICD-10-CM

## 2020-05-06 DIAGNOSIS — Z992 Dependence on renal dialysis: Principal | ICD-10-CM

## 2020-05-06 DIAGNOSIS — I4892 Unspecified atrial flutter: Principal | ICD-10-CM

## 2020-05-06 DIAGNOSIS — I5022 Chronic systolic (congestive) heart failure: Principal | ICD-10-CM

## 2020-05-06 DIAGNOSIS — I132 Hypertensive heart and chronic kidney disease with heart failure and with stage 5 chronic kidney disease, or end stage renal disease: Principal | ICD-10-CM

## 2020-05-06 DIAGNOSIS — Z8673 Personal history of transient ischemic attack (TIA), and cerebral infarction without residual deficits: Principal | ICD-10-CM

## 2020-05-06 DIAGNOSIS — Z79899 Other long term (current) drug therapy: Principal | ICD-10-CM

## 2020-05-06 DIAGNOSIS — I25119 Atherosclerotic heart disease of native coronary artery with unspecified angina pectoris: Principal | ICD-10-CM

## 2020-05-06 DIAGNOSIS — F32A Depression, unspecified: Principal | ICD-10-CM

## 2020-05-06 DIAGNOSIS — Z7901 Long term (current) use of anticoagulants: Principal | ICD-10-CM

## 2020-05-06 DIAGNOSIS — J45901 Unspecified asthma with (acute) exacerbation: Principal | ICD-10-CM

## 2020-05-06 DIAGNOSIS — F17220 Nicotine dependence, chewing tobacco, uncomplicated: Principal | ICD-10-CM

## 2020-05-06 DIAGNOSIS — I4891 Unspecified atrial fibrillation: Principal | ICD-10-CM

## 2020-05-06 DIAGNOSIS — Z794 Long term (current) use of insulin: Principal | ICD-10-CM

## 2020-05-06 DIAGNOSIS — Z550 Illiteracy and low-level literacy: Principal | ICD-10-CM

## 2020-05-06 DIAGNOSIS — K760 Fatty (change of) liver, not elsewhere classified: Principal | ICD-10-CM

## 2020-05-06 DIAGNOSIS — E785 Hyperlipidemia, unspecified: Principal | ICD-10-CM

## 2020-05-06 DIAGNOSIS — I252 Old myocardial infarction: Principal | ICD-10-CM

## 2020-05-06 DIAGNOSIS — G4733 Obstructive sleep apnea (adult) (pediatric): Principal | ICD-10-CM

## 2020-05-06 DIAGNOSIS — K219 Gastro-esophageal reflux disease without esophagitis: Principal | ICD-10-CM

## 2020-05-06 DIAGNOSIS — F410 Panic disorder [episodic paroxysmal anxiety] without agoraphobia: Principal | ICD-10-CM

## 2020-05-06 DIAGNOSIS — N186 End stage renal disease: Principal | ICD-10-CM

## 2020-05-06 DIAGNOSIS — Z885 Allergy status to narcotic agent status: Principal | ICD-10-CM

## 2020-05-06 DIAGNOSIS — R0602 Shortness of breath: Principal | ICD-10-CM

## 2020-05-06 DIAGNOSIS — Z888 Allergy status to other drugs, medicaments and biological substances status: Principal | ICD-10-CM

## 2020-05-06 DIAGNOSIS — I509 Heart failure, unspecified: Principal | ICD-10-CM

## 2020-05-06 LAB — MAGNESIUM: MAGNESIUM: 1.8 mg/dL (ref 1.6–2.6)

## 2020-05-06 LAB — CBC W/ AUTO DIFF
BASOPHILS ABSOLUTE COUNT: 0.1 10*9/L (ref 0.0–0.1)
BASOPHILS RELATIVE PERCENT: 1.2 %
EOSINOPHILS ABSOLUTE COUNT: 0.2 10*9/L (ref 0.0–0.7)
EOSINOPHILS RELATIVE PERCENT: 2 %
HEMATOCRIT: 35 % — ABNORMAL LOW (ref 38.0–50.0)
HEMOGLOBIN: 11.4 g/dL — ABNORMAL LOW (ref 13.5–17.5)
LYMPHOCYTES ABSOLUTE COUNT: 1.2 10*9/L (ref 0.7–4.0)
LYMPHOCYTES RELATIVE PERCENT: 16.4 %
MEAN CORPUSCULAR HEMOGLOBIN CONC: 32.6 g/dL (ref 30.0–36.0)
MEAN CORPUSCULAR HEMOGLOBIN: 28.2 pg (ref 26.0–34.0)
MEAN CORPUSCULAR VOLUME: 86.4 fL (ref 81.0–95.0)
MEAN PLATELET VOLUME: 7.9 fL (ref 7.0–10.0)
MONOCYTES ABSOLUTE COUNT: 0.7 10*9/L (ref 0.1–1.0)
MONOCYTES RELATIVE PERCENT: 8.7 %
NEUTROPHILS ABSOLUTE COUNT: 5.5 10*9/L (ref 1.7–7.7)
NEUTROPHILS RELATIVE PERCENT: 71.7 %
NUCLEATED RED BLOOD CELLS: 0 /100{WBCs} (ref <=4)
PLATELET COUNT: 291 10*9/L (ref 150–450)
RED BLOOD CELL COUNT: 4.05 10*12/L — ABNORMAL LOW (ref 4.32–5.72)
RED CELL DISTRIBUTION WIDTH: 16.8 % — ABNORMAL HIGH (ref 12.0–15.0)
WBC ADJUSTED: 7.6 10*9/L (ref 3.5–10.5)

## 2020-05-06 LAB — COMPREHENSIVE METABOLIC PANEL
ALBUMIN: 2.7 g/dL — ABNORMAL LOW (ref 3.4–5.0)
ALKALINE PHOSPHATASE: 107 U/L (ref 46–116)
ALT (SGPT): <7 U/L — ABNORMAL LOW (ref 10–49)
ANION GAP: 9 mmol/L (ref 5–14)
AST (SGOT): 15 U/L (ref <=34)
BILIRUBIN TOTAL: 0.2 mg/dL — ABNORMAL LOW (ref 0.3–1.2)
BLOOD UREA NITROGEN: 26 mg/dL — ABNORMAL HIGH (ref 9–23)
BUN / CREAT RATIO: 5
CALCIUM: 7.9 mg/dL — ABNORMAL LOW (ref 8.7–10.4)
CHLORIDE: 106 mmol/L (ref 98–107)
CO2: 23.4 mmol/L (ref 20.0–31.0)
CREATININE: 5.17 mg/dL — ABNORMAL HIGH
EGFR CKD-EPI AA MALE: 14 mL/min/{1.73_m2} — ABNORMAL LOW (ref >=60)
EGFR CKD-EPI NON-AA MALE: 12 mL/min/{1.73_m2} — ABNORMAL LOW (ref >=60)
GLUCOSE RANDOM: 166 mg/dL (ref 70–179)
POTASSIUM: 4.4 mmol/L (ref 3.4–4.5)
PROTEIN TOTAL: 6.9 g/dL (ref 5.7–8.2)
SODIUM: 138 mmol/L (ref 135–145)

## 2020-05-06 LAB — HIGH SENSITIVITY TROPONIN I - 2 HOUR SERIAL
HIGH SENSITIVITY TROPONIN - DELTA (0-2H): 3 ng/L (ref <=7)
HIGH-SENSITIVITY TROPONIN I - 2 HOUR: 29 ng/L (ref <=53)

## 2020-05-06 LAB — B-TYPE NATRIURETIC PEPTIDE: B-TYPE NATRIURETIC PEPTIDE: 644.26 pg/mL — ABNORMAL HIGH (ref <=100)

## 2020-05-06 LAB — HIGH SENSITIVITY TROPONIN I - SERIAL: HIGH SENSITIVITY TROPONIN I: 32 ng/L (ref <=53)

## 2020-05-06 MED ORDER — DIPHENHYDRAMINE 25 MG TABLET
ORAL_TABLET | Freq: Every evening | ORAL | 0 refills | 20.00000 days | Status: SS | PRN
Start: 2020-05-06 — End: 2020-05-11

## 2020-05-06 MED ADMIN — ipratropium-albuteroL (DUO-NEB) 0.5-2.5 mg/3 mL nebulizer solution 3 mL: 3 mL | RESPIRATORY_TRACT | Stop: 2020-05-06
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
## 2020-05-07 NOTE — Unmapped (Signed)
Care Management  Initial Transition Planning Assessment    Patient has been identified as a Runner, broadcasting/film/video.   Longitudinal Plan of Care reviewed, the following information is noted for continuity of care:  Pt goes to [facility] in [place] [name].No anticipated dc needs. Personal Health Advocate Pool contacted by In Basket message regarding post-acute services and collaboration around plan of care.              General  Care Manager assessed the patient by : Medical record review  Orientation Level: Oriented X4  Functional level prior to admission: Independent  Reason for referral: Discharge Planning,Other - specify in comments  Additional Information: ACO pt   Type of Residence: Mailing Address:  [address]  [place] [address]  Contacts: Accompanied by: Alone   Extended Emergency Contact Information  Primary Emergency Contact: Corales,[name]  Address: [address]           [place], [place] [address] [place] [employer] Phone: [phone]  Relation: Spouse  Secondary Emergency Contact: Schweikert,Bridgette   [place]  Home Phone: [phone]  Mobile Phone: [phone]  Relation: Daughter  Patient Phone Number: [phone] (home)         Medical Provider(s): Kurtis Bushman, MD  Reason for Admission: Admitting Diagnosis:  No admission diagnoses are documented for this encounter.  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  Past Surgical History:   has a past surgical history that includes Knee surgery (Right, 09/23/2006); Cardiac catheterization (03/08/2007); Cystourethroscopy (12/31/2009 ); pr inser hart pacer xvenous atrial (N/A, 05/30/2015); pr compre ep eval abltj 3d mapg tx svt (N/A, 07/19/2015); pr upper gi endoscopy,biopsy (N/A, 02/28/2016); pr upper gi endoscopy,diagnosis (N/A, 05/07/2016); pr cath place/coron angio, img super/interp,r&l hrt cath, l hrt ventric (N/A, 02/13/2017); pr cath place/coron angio, img super/interp,w left heart ventriculography (N/A, 03/17/2017); pr insert/place flow direct cath (N/A, 03/17/2017); pr ecmo/ecls initiation veno-arterial (N/A, 03/19/2017); pr rebl ves direct,low extrem (Left, 03/19/2017); pr ecmo/ecls rmvl prph cannula open 6 yrs & older (Left, 03/25/2017); pr remv art clot iliac-pop,leg incis (Left, 03/25/2017); pr negative pressure wound therapy dme >50 sq cm (Left, 03/25/2017); pr insj/rplcmt perm dfb w/trnsvns lds 1/dual chmbr (N/A, 05/04/2017); pr prq trluml coronary stent w/angio one art/brnch (N/A, 03/12/2018); pr cath place/coron angio, img super/interp,w left heart ventriculography (N/A, 05/12/2018); pr cath place/coron angio, img super/interp,w left heart ventriculography (N/A, 10/22/2018); Coronary stent placement; pr cath place/coron angio, img super/interp,w left heart ventriculography (N/A, 08/19/2019); pr prq trluml coronary stent w/angio one art/brnch (N/A, 01/27/2020); and pr cath place/coron angio, img super/interp,w left heart ventriculography (N/A, 03/27/2020).   Previous admit date: 02/24/2020    Primary Insurance- Payor: MEDICARE / Plan: MEDICARE PART A AND PART B / Product Type: *No Product type* /   Secondary Insurance ??? Secondary Insurance  MEDICAID [place]  Prescription Coverage ??? same as above  Preferred Pharmacy - [facility] - [place], [place] - [address]  [facility]  [facility] [place]  [facility]  [facility] - [place] ([place] [place] - [address]    Transportation home: Private vehicle    Contact/Decision Maker  Extended Emergency Contact Information  Primary Emergency Contact: Dimitroff,[name]  Address: [address]           [place], [place] [address] [place] [employer] Phone: [phone]  Relation: Spouse  Secondary Emergency Contact: Staszewski,Bridgette   [place]  Home Phone: [phone]  Mobile Phone: [phone]  Relation: Daughter    Legal Next of Kin / Guardian / POA / Advance Directives     [name] [facility]): Claar,[name] - [phone]    [name], First Alternate: Campi,Bridgette - Daughter - [phone]    Advance Directive (Medical Treatment)  Does patient have an advance directive covering medical treatment?: Patient does not have advance directive covering medical treatment.  Reason patient does not have an advance directive covering medical treatment:: Patient needs follow-up to complete one.    Health Care Decision Maker [[name]] (Medical & Mental Health Treatment)  Healthcare Decision Maker: [name] documented in the [name]/Contact Info section.    Patient Information  Lives with: Spouse/significant other    Type of Residence: Private residence     Location/Detail: [place], [place]    Support Systems/Concerns: Children,Spouse    Responsibilities/Dependents at home?: No    Home Care services in place prior to admission?: No     Equipment Currently Used at Home: wheelchair, manual,walker, rolling,respiratory supplies     Currently receiving outpatient dialysis?: Yes [facility] [place] [name]    Financial Information    Need for financial assistance?: No    Social Determinants of Health  Social Determinants of Health were addressed in provider documentation.  Please refer to patient history.  Social Determinants of Health     Tobacco Use: High Risk   ??? Smoking Tobacco Use: Never Smoker   ??? Smokeless Tobacco Use: Current User   Alcohol Use: Not At Risk   ??? How often do you have a drink containing alcohol?: Never   ??? How many drinks containing alcohol do you have on a typical day when you are drinking?: Not on file   ??? How often do you have 5 or more drinks on one occasion?: Never   Financial Resource Strain: Low Risk    ??? Difficulty of Paying Living Expenses: Not very hard   Food Insecurity: No Food Insecurity   ??? Worried About Running Out of Food in the Last Year: Never true   ??? Ran Out of Food in the Last Year: Never true   Transportation Needs: No Transportation Needs   ??? Lack of Transportation (Medical): No   ??? Lack of Transportation (Non-Medical): No   Physical Activity: Not on file   Stress: Stress Concern Present   ??? Feeling of Stress : Rather much   Social Connections: Moderately Isolated   ??? Frequency of Communication with Friends and Family: More than three times a week   ??? Frequency of Social Gatherings with Friends and Family: Twice a week   ??? Attends Religious Services: Never   ??? Active Member of Clubs or Organizations: No   ??? Attends Banker Meetings: Never   ??? Marital Status: Married   Catering manager Violence: Not At Risk   ??? Fear  of Current or Ex-Partner: No   ??? Emotionally Abused: No   ??? Physically Abused: No   ??? Sexually Abused: No   Depression: Not at risk   ??? PHQ-2 Score: 0   Housing/Utilities: High Risk   ??? Within the past 12 months, have you ever stayed: outside, in a car, in a tent, in an overnight shelter, or temporarily in someone else's home (i.e. couch-surfing)?: No   ??? Are you worried about losing your housing?: Yes   ??? Within the past 12 months, have you been unable to get utilities (heat, electricity) when it was really needed?: No   Substance Use: Low Risk    ??? Taken prescription drugs for non-medical reasons: Never   ??? Taken illegal drugs: Never   ??? Patient indicated they have taken drugs in the past year for non-medical reasons: Yes, [positive answer(s)]: Not on file   Health Literacy: High Risk   ??? : Always     Discharge Needs Assessment  Concerns to be Addressed: adjustment to diagnosis/illness    Clinical Risk Factors: New Diagnosis    Barriers to taking medications: No    Prior overnight hospital stay or ED visit in last 90 days: Yes    Readmission Within the Last 30 Days: no previous admission in last 30 days    Anticipated Changes Related to Illness: none    Equipment Needed After Discharge: none    Discharge Facility/Level of Care Needs: other (see comments) (HH)    Readmission  Risk of Unplanned Readmission Score:  %  Predictive Model Details   No score data available for [facility] Risk of Unplanned Readmission     Readmitted Within the Last 30 Days? (No if blank)   Patient at risk for readmission?: Yes    Discharge Plan  Screen findings are: Care Manager reviewed the plan of the patient's care with the Multidisciplinary Team. No discharge planning needs identified at this time. Care Manager will continue to manage plan and monitor patient's progress with the team.    Expected Discharge Date: 05/07/2020    Expected Transfer from Critical Care:      Quality data for continuing care services shared with patient and/or representative?: No  Patient and/or family were provided with choice of facilities / services that are available and appropriate to meet post hospital care needs?: No    Initial Assessment complete?: Yes

## 2020-05-07 NOTE — Unmapped (Signed)
Covid+ 04/23/20  Pt reports having shortness of breath and numbness around mouth. Symptoms began this afternoon. Pt able to speak in complete sentences without difficulty.

## 2020-05-08 ENCOUNTER — Telehealth: Admit: 2020-05-08 | Discharge: 2020-05-09 | Payer: MEDICARE

## 2020-05-08 DIAGNOSIS — M109 Gout, unspecified: Principal | ICD-10-CM

## 2020-05-08 DIAGNOSIS — I480 Paroxysmal atrial fibrillation: Principal | ICD-10-CM

## 2020-05-08 DIAGNOSIS — I5022 Chronic systolic (congestive) heart failure: Principal | ICD-10-CM

## 2020-05-08 DIAGNOSIS — Z992 Dependence on renal dialysis: Principal | ICD-10-CM

## 2020-05-08 DIAGNOSIS — N186 End stage renal disease: Principal | ICD-10-CM

## 2020-05-08 DIAGNOSIS — Z09 Encounter for follow-up examination after completed treatment for conditions other than malignant neoplasm: Principal | ICD-10-CM

## 2020-05-08 MED ORDER — GABAPENTIN 100 MG CAPSULE
ORAL_CAPSULE | Freq: Two times a day (BID) | ORAL | 3 refills | 90.00000 days | Status: SS
Start: 2020-05-08 — End: 2021-05-08

## 2020-05-08 MED ORDER — ALLOPURINOL 100 MG TABLET
ORAL_TABLET | Freq: Every day | ORAL | 3 refills | 90.00000 days | Status: SS
Start: 2020-05-08 — End: 2021-05-08

## 2020-05-08 MED ORDER — ONDANSETRON HCL 4 MG TABLET
ORAL_TABLET | Freq: Two times a day (BID) | ORAL | 0 refills | 8 days | Status: SS | PRN
Start: 2020-05-08 — End: ?

## 2020-05-08 MED ORDER — MIRTAZAPINE 7.5 MG TABLET
ORAL_TABLET | Freq: Every evening | ORAL | 0 refills | 90.00000 days | Status: SS
Start: 2020-05-08 — End: 2020-08-06

## 2020-05-08 NOTE — Unmapped (Signed)
[facility]       Type of visit:  video    Reason for visit: Follow-up    Questions / Concerns that need to be addressed: Patient has been having anxiety and requesting refills    General Consent to Treat (GCT) for non-epic video visits only: Verbal consent      Diabetes:  ??? Regularly checking blood sugars?: yes  o If yes, when? Complete log for past 7 days  Date Before Breakfast After Breakfast Before Lunch After Lunch Before Dinner After Dinner Before Bed    02/08  200                                                                                                                               Hypertension:  ??? Have blood pressure cuff at home?: yes- arm cuff  ??? Regularly checking blood pressure?: yes        Allergies reviewed: Yes    Medication reviewed: Yes  Pended refills? Yes        [name] reviewed and updated in Epic:    We are working to make sure all of our patients??? wishes are updated in Epic and part of that is documenting a Environmental health practitioner for each patient  A Health Care Decision Rodena Piety is someone you choose who can make health care decisions for you if you are not able ??? who would you most want to do this for you????  is already up to date.              COVID-19 Vaccine Summary  Which COVID-19 Vaccine was administered  Janssen  Type:  Janssen-Complete  Dates Given:  09/06/2019     Date last COVID Positive Test:   04/23/2020      Date last mAB infusion:  04/27/2020              Immunization History   Administered Date(s) Administered   ??? COVID-19 VACC,(JANSSEN)(PF)(IM) 09/06/2019   ??? INFLUENZA TIV (TRI) PF (IM) 12/09/2007, 12/27/2008, 01/09/2011   ??? Influenza Vaccine Quad (IIV4 PF) 51mo+ injectable 01/09/2012, 12/06/2012, 03/02/2014, 12/21/2014, 12/05/2015, 12/09/2016, 12/04/2017, 12/10/2018   ??? Influenza Virus Vaccine, unspecified formulation 01/06/2020   ??? Novel Influenza-h1n1-09, All Formulations 03/13/2008   ??? PNEUMOCOCCAL POLYSACCHARIDE 23 02/10/2007   ??? TdaP 08/30/2009       __________________________________________________________________________________________    SCREENINGS COMPLETED IN FLOWSHEETS    HARK Screening       AUDIT       PHQ2       PHQ9          P4 Suicidality Screener                GAD7       COPD Assessment       Falls Risk       .imcres

## 2020-05-08 NOTE — Unmapped (Signed)
I tried calling the pt back and got pt's wife.  I was told that they were in the [facility] parking lot.  Mrs Hazelbaker was upset about the late notice regarding the appointment change.  I informed her and the pt that due the Covid Infection flag on the pt's chart we couldn't see him in the building.  She stated that the pt doesn't have Covid and that pt had a test at the ED on 05/06/2020 that was negative.  I placed them on hold and messaged pt's PCP and clinical staff of the info.  I returned to the call to let them know that because of the flag on pt's chart he would be allow to enter into the building and the the request has been placed the have the flag removed.  Pt stated that he was okay with doing the virtual visit.    A msg was sent to notify Dr Hulan Fess.

## 2020-05-08 NOTE — Unmapped (Signed)
Per Dr Hulan Fess, Phil Dopp, can you contact patient first thing in the AM to try to convert his appt to virtual? I just caught that he still has COVID flag. I have submitted request for that COVID flag to be removed, but not sure that Infection Prevention will do that in time for hiis appt at 8:20am on Tuesday. Thanks!    I tried to call this pt at 7:48am.  Pt's phone went straight to VM.  I left pt a vm regarding the appointment change and sent pt a WELL text.

## 2020-05-08 NOTE — Unmapped (Signed)
Assessment of COVID-19 Isolation Need (revised January 10, 2020)    To determine when it is safe to discontinue special airborne/contact isolation, review the definitions below and select which criteria below have been met:     CDC DEFINITIONS:    Date of Onset: This patient's first positive documented COVID-19 test on 04/26/2020 is used as the date of onset to determine duration of infectivity and isolation precautions within our healthcare system.     If reinfection is suspected after 90 days has passed following first positive test, contact the COVID-ID consult team for guidance.      NOTE:  Admitting providers are responsible for obtaining valid information regarding COVID test results (type of test [negative antigen do not exclude COVID; obtain [facility] RT-PCR]), results, date and location of test) on patients tested at other healthcare facilities.     Illness Severity: the highest level of illness severity during their clinical course should be used when determining illness severity.   a. Mild Illness: Individuals who have any of the various signs and symptoms of COVID-19 (e.g., fever, cough, sore throat, malaise, headache, muscle pain) without shortness of breath, dyspnea, or abnormal chest imaging.  b. Moderate Illness: Individuals who have evidence of lower respiratory disease by clinical assessment or imaging, and a saturation of oxygen (SpO2) ?94% on room air.   c. Severe Illness: Individuals who have respiratory frequency >30 breaths per minute, SpO2 <94% on room air at sea level (or, for patients with chronic hypoxemia, a decrease from baseline of >3%), ratio of arterial partial pressure of oxygen to fraction of inspired oxygen (PaO2/FiO2) <300 mmHg, or lung infiltrates >50%.  d. Critical Illness: Individuals who have respiratory failure, septic shock, and/or multiple organ dysfunction.  *In pediatric patients, radiographic abnormalities are common and, for the most part, should not be used as the sole criteria to define COVID-19 illness category. Normal values for respiratory rate also vary with age in children, thus hypoxia should be the primary criterion to define severe illness, especially in younger children.    Severe Immunocompromising Conditions:  ??? Primary immunodeficiency  ??? Active solid organ cancer on chemotherapy  ??? Hematologic malignancy  ??? Hematopoietic stem cell transplant recipient  ??? Solid organ transplant recipient  ??? Poorly controlled HIV (CD4 < 200)  ??? Steroids >20mg  per day for >2 week  ??? Other immunosuppressive medications (e.g. infliximab, etc)    CONFIRM ALL CRITERIA MET IN ONE OF THE FOLLOWING CATEGORIES (or continue Special Airborne/Contact Isolation):      1. Patients who had asymptomatic, mild, or moderate illness and are not severely immunocompromised:  Criteria  Met?   Patient meets CDC criteria for mild/moderate illness based on provider's clinical judgement  Yes[x]    No []        Patient is not severely immunocompromised as defined by CDC  Yes[x]    No []    At least 10 days (date of test = day 1) have passed since the date of the positive test or symptom onset, whichever is first. Yes[x]    No []    At least 24 hours have passed since last fever (>=38) without fever-reducing medications*, **  Yes[x]    No []    Symptoms (e.g., cough, shortness of breath) have improved based on attending physician's clinical judgement*  Yes[x]    No []    If all the above criteria are met based on provider's clinical judgment, special airborne contact isolation can be safely discontinued for this patient population when at least 10 days have  passed.   Date of test = day 1.  (Example:  patient tests positive on 9/1, isolation Coone be removed on 9/11) Yes[x]    No []    *Criteria not applicable for asymptomatic patients.  **COVID patients Kuri be on fever reducing medications for other reasons or have other reasons for fever. These patients Prim be removed from isolation per provider's clinical judgment when at least 10 days have passed from positive test.     2. Patients who have severe/critical illness or are severely immunocompromised (regardless of disease severity):  Criteria Met?   Patient meets CDC criteria for severe/critical illness based on provider's clinical judgement or is severely immunocompromised  Yes[]    No []    At least 20 days (date of test = day 1) have passed since the date of the positive test. Yes[]    No []    At least 24 hours have passed since last fever (>=38) without fever-reducing medications**  Yes[]    No []    Symptoms (e.g., cough, shortness of breath) have improved based on attending physician's clinical judgement  Yes[]    No []    If all the above criteria are met based on provider's clinical judgment, special airborne contact isolation can be safely discontinued for this patient population at 20 days (date of test = day 1). Yes[]    No []    **COVID patients Czerwinski be on fever-reducing medications for other reasons or have other reasons for fever. These patients Macknight be removed from isolation per provider's clinical judgment when at least 20 days have passed.     Provider's Attestation Statement    After reviewing the patient's clinical course and definitions above, I attest this patient meets criteria for discontinuation of Special Airborne/Contact isolation precautions for COVID-19 effective 05/07/2020.  This patient Corprew be cared for using Universal Pandemic Precautions.     To remove the COVID-19 Infection Status banner, contact the [facility] for inpatients and email [email or web address] for outpatients.

## 2020-05-08 NOTE — Unmapped (Signed)
[facility]  Established Patient - Video Visit    This visit is conducted via video conferencing.    Contact Information  Person Contacted: Patient  Contact Phone number: [phone] (home)   Is there someone else in the room? Yes. What is your relationship? wife. Do you want this person here for the visit? yes.  Patient agreed to a video visit    Jeremy Oliver is a 50 y.o. male  participating in a video visit.    Reason for visit:  Hospital follow-up anxiety    Subjective:   Reports that anxiety is causing him issues.  Reports episode at night where anxiety was really bad and caused his ICD to go off.  Called EMS and was taken to [facility] ED. Reports that amiodarone was increased to twice a day.      Has visit with cardiologist soon.     Going to HD, but center in [place] cannot have anyone with COVID, so has been driving to [facility].  Will be able to go back to [facility] on Wednesday.  Last HD session was in [place] on 2/5.  Was suppose to go back to [facility] again today, but opted to restart on [name] at [facility] tomorrow.     Reports that [facility] changed to Levemir 10 units and has been taking that dose ever since.      Hospitalized 1/30-22/03/2020 at [facility] for chest pain and possible seizure like episode.  Had consultation with neurology and cardiology. PM interrogation at the time noted atrial tachycardia per notes.Had negative EEG and brain imaging.    Has had several urgent care and ED visits for SOB, but discharged from ED. Though to be related to COVID infection, but recently retested negative.     Ongoing nausea after dialysis and feels that this is due to low blood pressures.  Not sure if he if and what medications he should be holding on dialysis days.       I have reviewed the problem list, medications, and allergies and have updated/reconciled them if needed.    Objective:  As part of this Video Visit, no in-person exam was conducted.  Video interaction permitted the following observations.    GEN: No acute distress.  SKIN: Color is normal. No rashes.  PSYCH: Appropriate affect, normal mood  RESP: Normal work of breathing, no retractions      Assessment & Plan:  1. ESRD on dialysis (CMS-[name])    2. Hospital discharge follow-up    3. Gout, unspecified cause, unspecified chronicity, unspecified site    4. Paroxysmal atrial fibrillation (CMS-[name])    5. Chronic systolic CHF (congestive heart failure) (CMS-[name])        ESRD on HD:  Will resume HD at his local HD center in [place] now that he is COVID negative at recent ED visit.  I asked him to discuss with nephrologist there about what meds to hold on HD days if having BP issues.    Gout:  Controlled on allopurinol.  Recheck uric acid level at next visit as not yet reach goal < 6.     PAfib:  Recent remote monitoring notes recent episode of VT with shock and VT with ATP.  Discussed appropriate amiodarone dose per last visit with cardiology. Has follow-up with Dr. Excell Seltzer 3/11    Chronic systolic HF:  Last ECHO 08/2019 with EF 35% Unable to assess volume status on virtual visit. Has close follow-up with Dr.  Andrey Farmer on 2/17           Return in about 4 weeks (around 06/05/2020).        The patient reports they are currently: not at home. I spent 28 minutes on the real-time audio and video with the patient on the date of service. I spent an additional 15 minutes on pre- and post-visit activities on the date of service.     The patient was physically located in [place] or a state in which I am permitted to provide care. The patient and/or parent/guardian understood that s/he [place] incur co-pays and cost sharing, and agreed to the telemedicine visit. The visit was reasonable and appropriate under the circumstances given the patient's presentation at the time.    The patient and/or parent/guardian has been advised of the potential risks and limitations of this mode of treatment (including, but not limited to, the absence of in-person examination) and has agreed to be treated using telemedicine. The patient's/patient's family's questions regarding telemedicine have been answered.     If the visit was completed in an ambulatory setting, the patient and/or parent/guardian has also been advised to contact their provider???s office for worsening conditions, and seek emergency medical treatment and/or call 911 if the patient deems either necessary.

## 2020-05-09 ENCOUNTER — Emergency Department
Admit: 2020-05-09 | Discharge: 2020-05-09 | Disposition: A | Discharge status: Home / Self Care | Payer: MEDICARE | Attending: Family

## 2020-05-09 ENCOUNTER — Ambulatory Visit
Admit: 2020-05-09 | Discharge: 2020-05-09 | Disposition: A | Discharge status: Home / Self Care | Payer: MEDICARE | Attending: Family

## 2020-05-09 DIAGNOSIS — Z888 Allergy status to other drugs, medicaments and biological substances status: Principal | ICD-10-CM

## 2020-05-09 DIAGNOSIS — Z885 Allergy status to narcotic agent status: Principal | ICD-10-CM

## 2020-05-09 DIAGNOSIS — Z7951 Long term (current) use of inhaled steroids: Principal | ICD-10-CM

## 2020-05-09 DIAGNOSIS — I2510 Atherosclerotic heart disease of native coronary artery without angina pectoris: Principal | ICD-10-CM

## 2020-05-09 DIAGNOSIS — J45909 Unspecified asthma, uncomplicated: Principal | ICD-10-CM

## 2020-05-09 DIAGNOSIS — Z550 Illiteracy and low-level literacy: Principal | ICD-10-CM

## 2020-05-09 DIAGNOSIS — Z79899 Other long term (current) drug therapy: Principal | ICD-10-CM

## 2020-05-09 DIAGNOSIS — Z8673 Personal history of transient ischemic attack (TIA), and cerebral infarction without residual deficits: Principal | ICD-10-CM

## 2020-05-09 DIAGNOSIS — K760 Fatty (change of) liver, not elsewhere classified: Principal | ICD-10-CM

## 2020-05-09 DIAGNOSIS — N186 End stage renal disease: Principal | ICD-10-CM

## 2020-05-09 DIAGNOSIS — R200 Anesthesia of skin: Principal | ICD-10-CM

## 2020-05-09 DIAGNOSIS — M542 Cervicalgia: Principal | ICD-10-CM

## 2020-05-09 DIAGNOSIS — F410 Panic disorder [episodic paroxysmal anxiety] without agoraphobia: Principal | ICD-10-CM

## 2020-05-09 DIAGNOSIS — I4892 Unspecified atrial flutter: Principal | ICD-10-CM

## 2020-05-09 DIAGNOSIS — R519 Headache, unspecified: Principal | ICD-10-CM

## 2020-05-09 DIAGNOSIS — I4891 Unspecified atrial fibrillation: Principal | ICD-10-CM

## 2020-05-09 DIAGNOSIS — I5020 Unspecified systolic (congestive) heart failure: Principal | ICD-10-CM

## 2020-05-09 DIAGNOSIS — Z7901 Long term (current) use of anticoagulants: Principal | ICD-10-CM

## 2020-05-09 DIAGNOSIS — M549 Dorsalgia, unspecified: Principal | ICD-10-CM

## 2020-05-09 DIAGNOSIS — G4733 Obstructive sleep apnea (adult) (pediatric): Principal | ICD-10-CM

## 2020-05-09 DIAGNOSIS — I132 Hypertensive heart and chronic kidney disease with heart failure and with stage 5 chronic kidney disease, or end stage renal disease: Principal | ICD-10-CM

## 2020-05-09 DIAGNOSIS — E785 Hyperlipidemia, unspecified: Principal | ICD-10-CM

## 2020-05-09 DIAGNOSIS — G894 Chronic pain syndrome: Principal | ICD-10-CM

## 2020-05-09 DIAGNOSIS — I252 Old myocardial infarction: Principal | ICD-10-CM

## 2020-05-09 DIAGNOSIS — I440 Atrioventricular block, first degree: Principal | ICD-10-CM

## 2020-05-09 DIAGNOSIS — Z794 Long term (current) use of insulin: Principal | ICD-10-CM

## 2020-05-09 DIAGNOSIS — M109 Gout, unspecified: Principal | ICD-10-CM

## 2020-05-09 DIAGNOSIS — F17220 Nicotine dependence, chewing tobacco, uncomplicated: Principal | ICD-10-CM

## 2020-05-09 DIAGNOSIS — E1122 Type 2 diabetes mellitus with diabetic chronic kidney disease: Principal | ICD-10-CM

## 2020-05-09 DIAGNOSIS — Z955 Presence of coronary angioplasty implant and graft: Principal | ICD-10-CM

## 2020-05-09 DIAGNOSIS — E1121 Type 2 diabetes mellitus with diabetic nephropathy: Principal | ICD-10-CM

## 2020-05-09 LAB — COMPREHENSIVE METABOLIC PANEL
ALBUMIN: 3 g/dL — ABNORMAL LOW (ref 3.4–5.0)
ALKALINE PHOSPHATASE: 118 U/L — ABNORMAL HIGH (ref 46–116)
ALT (SGPT): 7 U/L — ABNORMAL LOW (ref 10–49)
ANION GAP: 7 mmol/L (ref 5–14)
AST (SGOT): 13 U/L (ref <=34)
BILIRUBIN TOTAL: 0.3 mg/dL (ref 0.3–1.2)
BLOOD UREA NITROGEN: 32 mg/dL — ABNORMAL HIGH (ref 9–23)
BUN / CREAT RATIO: 6
CALCIUM: 8.4 mg/dL — ABNORMAL LOW (ref 8.7–10.4)
CHLORIDE: 109 mmol/L — ABNORMAL HIGH (ref 98–107)
CO2: 20 mmol/L (ref 20.0–31.0)
CREATININE: 5.71 mg/dL — ABNORMAL HIGH
EGFR CKD-EPI AA MALE: 12 mL/min/{1.73_m2} — ABNORMAL LOW (ref >=60)
EGFR CKD-EPI NON-AA MALE: 11 mL/min/{1.73_m2} — ABNORMAL LOW (ref >=60)
GLUCOSE RANDOM: 160 mg/dL (ref 70–179)
POTASSIUM: 5 mmol/L — ABNORMAL HIGH (ref 3.4–4.5)
PROTEIN TOTAL: 7.2 g/dL (ref 5.7–8.2)
SODIUM: 136 mmol/L (ref 135–145)

## 2020-05-09 LAB — CBC W/ AUTO DIFF
BASOPHILS ABSOLUTE COUNT: 0.1 10*9/L (ref 0.0–0.1)
BASOPHILS RELATIVE PERCENT: 1.1 %
EOSINOPHILS ABSOLUTE COUNT: 0.1 10*9/L (ref 0.0–0.7)
EOSINOPHILS RELATIVE PERCENT: 1.5 %
HEMATOCRIT: 36.5 % — ABNORMAL LOW (ref 38.0–50.0)
HEMOGLOBIN: 11.6 g/dL — ABNORMAL LOW (ref 13.5–17.5)
LYMPHOCYTES ABSOLUTE COUNT: 1.2 10*9/L (ref 0.7–4.0)
LYMPHOCYTES RELATIVE PERCENT: 14.8 %
MEAN CORPUSCULAR HEMOGLOBIN CONC: 31.7 g/dL (ref 30.0–36.0)
MEAN CORPUSCULAR HEMOGLOBIN: 27.9 pg (ref 26.0–34.0)
MEAN CORPUSCULAR VOLUME: 87.9 fL (ref 81.0–95.0)
MEAN PLATELET VOLUME: 7.9 fL (ref 7.0–10.0)
MONOCYTES ABSOLUTE COUNT: 0.6 10*9/L (ref 0.1–1.0)
MONOCYTES RELATIVE PERCENT: 7.5 %
NEUTROPHILS ABSOLUTE COUNT: 5.9 10*9/L (ref 1.7–7.7)
NEUTROPHILS RELATIVE PERCENT: 75.1 %
NUCLEATED RED BLOOD CELLS: 0 /100{WBCs} (ref <=4)
PLATELET COUNT: 263 10*9/L (ref 150–450)
RED BLOOD CELL COUNT: 4.16 10*12/L — ABNORMAL LOW (ref 4.32–5.72)
RED CELL DISTRIBUTION WIDTH: 16.8 % — ABNORMAL HIGH (ref 12.0–15.0)
WBC ADJUSTED: 7.9 10*9/L (ref 3.5–10.5)

## 2020-05-09 LAB — HIGH SENSITIVITY TROPONIN I - SINGLE: HIGH SENSITIVITY TROPONIN I: 46 ng/L (ref <=53)

## 2020-05-09 MED ADMIN — lidocaine (LIDODERM) 5 % patch 1 patch: 1 | TRANSDERMAL | @ 17:00:00 | Stop: 2020-05-09

## 2020-05-09 MED ADMIN — HYDROcodone-acetaminophen (NORCO) 5-325 mg per tablet 1 tablet: 1 | ORAL | @ 17:00:00 | Stop: 2020-05-09

## 2020-05-09 MED ADMIN — methocarbamoL (ROBAXIN) tablet 500 mg: 500 mg | ORAL | @ 17:00:00 | Stop: 2020-05-09

## 2020-05-09 MED ADMIN — metoprolol succinate (TOPROL-XL) 24 hr tablet 100 mg: 100 mg | ORAL | @ 18:00:00 | Stop: 2020-05-09

## 2020-05-09 NOTE — Unmapped (Signed)
Pt in NAD and ambulatory by self at time of discharge.

## 2020-05-09 NOTE — Unmapped (Signed)
.  Patient rounding complete, call bell in reach, bed locked and in lowest position, patient belongings at bedside and within reach of patient.  Patient updated on plan of care.      Patient BP 193/105 Notified to Nurse Felicity Pellegrini RN.     Nurse Felicity Pellegrini RN arrived to the room with pt for pain Med's.

## 2020-05-09 NOTE — Unmapped (Signed)
[facility]  Emergency Department Provider Note    ED Clinical Impression     Final diagnoses:   Back pain, unspecified back location, unspecified back pain laterality, unspecified chronicity (Primary)       Initial Impression, ED Course, Assessment and Plan     Impression: Jeremy Oliver is a 50 y.o. male with a PMH of asthma, A-fib with flutter (on Eliquis), CAD s/p stent placement, T2DM, ESRD, GERD, HLD, HTN, NSTEMI (2018), chronic pain syndrome, and HFrEF (EF 20-25%) presenting to the ED for evaluation of diffuse back pain since last night.     On exam, the patient is uncomfortable-appearing, but nontoxic and in no acute distress. Vital signs are notable for hypertension to 197/120. Moderate tenderness to palpation diffusely throughout thoracic and cervical spine. Pain is reproducible on palpation.    Differential includes arthralgia vs. acute on chronic pain syndrome vs. atypical ACS vs. DDD vs. muskuloskeletal pain of unknown etiology.     Plan for EKG, basic labs, hsTroponin, XR Thoracic Spine and XR Cervical Spine. Will give Robaxin, Norco, and lidocaine patch for pain management.     11:45 AM  Spinal imaging is negative for any fractures or dislocations. Labs are notable for mild hyperkalemia to 5.0. His last dialysis session was yesterday, with his next session being tomorrow. No changes on EKG.     11:58 AM  Will order 100 mg metoprolol for his blood pressure, as patient did not take his daily dose this morning.     1:48 PM  Patient reports symptomatic improvement on reassessment. Plan for discharge with PCP follow up advised. Patient is agreeable to plan. Strict return follow up advised.       Additional Medical Decision Making     I independently visualized the EKG tracing.   I independently visualized the radiology images.   I reviewed the patient's prior medical records.     Labs and radiology results that were available during my care of the patient were independently reviewed by me and considered in my medical decision making.    Portions of this record have been created using Scientist, clinical (histocompatibility and immunogenetics). Dictation errors have been sought, but Capizzi not have been identified and corrected.  ____________________________________________    I have reviewed the triage vital signs and the nursing notes.     History     Chief Complaint  Back Pain      HPI   Jeremy Oliver is a 50 y.o. male with a past medical history of asthma, A-fib with flutter (on Eliquis), CAD s/p stent placement, T2DM, ESRD, GERD, HLD, HTN, NSTEMI (2018), chronic pain syndrome, and HFrEF (EF 20-25%) presenting to the ED for evaluation of back pain. The patient reports 8/10 aching back pain that began last night while he was sitting watching TV. He notes that the pain travels up through his back to his bilateral shoulders into his neck. He notes associated numbness and tingling in his fingers and a mild headache. He denies recent heavy lifting, falls, or trauma. He took Tylenol for his pain, which did not provide relief. He did not take his blood pressure medication this morning. He denies chest pain, shortness of breath, fevers, or visual changes.      Per chart review, the patient had a recent admission from 04/23/20-1/26-22 for unresponsiveness/seizure-like activity/cardiac syncope with a negative EEG. He has has multiple ED visits and PCP visits since then for multiple complaints, including shortness of breath and anxiety. Of note,  his amiodarone recently doubled in dosage.     Past Medical History:   Diagnosis Date   ??? Angina at rest (CMS-[name])    ??? Arthritis    ??? Asthma    ??? Atrial fibrillation and flutter (CMS-[name]) 06/08/14   ??? BMI 40.0-44.9, adult (CMS-[name]) 10/29/2015   ??? Chronic anticoagulation 02/17/2018   ??? Chronic pain syndrome 04/11/2014    ~Use daily lyrica as recommended ~Tramadol as needed for pain ~Fluoxetine daily as recommended ~Daily exercise ~Eat a healthy diet ~Good control of your blood sugars can help    ??? Coronary artery disease    ??? Depression    ??? Diabetes mellitus (CMS-[name])    ??? ESRD (end stage renal disease) on dialysis (CMS-[name])    ??? Eye trauma 1998    glass in both eyes and removed   ??? GERD (gastroesophageal reflux disease)    ??? Gout    ??? Homeless    ??? Hyperlipidemia    ??? Hypertension    ??? Illiteracy and low-level literacy    ??? Microscopic hematuria    ??? Migraine    ??? Nephrolithiasis    ??? 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 Nonalcoholic fatty liver disease    ??? NSTEMI (non-ST elevated myocardial infarction) (CMS-[name]) 01/13/2017   ??? Obesity    ??? Obstructive sleep apnea 2009   ??? Panic attacks    ??? 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 Seizure-like activity (CMS-[name])     ~Monitor for symptoms and report them to your primary care provider if they occur    ??? Systolic CHF, chronic (CMS-[name])     EF 40-45% 2017   ??? TIA (transient ischemic attack)        Patient Active Problem List   Diagnosis   ??? 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 Idiopathic chronic gout of left knee   ??? Hypercholesteremia   ??? Diabetes mellitus with coincident hypertension (CMS-[name])   ??? 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 Osteoarthritis   ??? Chronic nonalcoholic liver disease   ??? GERD (gastroesophageal reflux disease)   ??? Mild intermittent asthma without complication   ??? Chronic pain syndrome   ??? Paroxysmal atrial fibrillation (CMS-[name])   ??? OSA (obstructive sleep apnea) ??? Tobacco use disorder   ??? Status post cardiac pacemaker procedure   ??? Diabetes mellitus with gastroparesis (CMS-[name])   ??? Cardiac pacemaker in situ   ??? Recurrent major depressive disorder, in full remission (CMS-[name])   ??? NSTEMI (non-ST elevation myocardial infarction) (CMS-[name])   ??? Mixed anxiety and depressive disorder   ??? HFrEF (heart failure with reduced ejection fraction) (CMS-[name])   ??? Hyperkalemia   ??? Acquired left foot drop   ??? Decreased sensation of lower extremity, left   ??? Chronic anticoagulation   ??? Type 2 diabetes mellitus with stage 4 chronic kidney disease, with long-term current use of insulin (CMS-[name])   ??? Situational stress   ???  Cough   ??? QT prolongation   ??? ESRD needing dialysis (CMS-[name])   ??? Palliative care by specialist   ??? Angina pectoris (CMS-[name])   ??? Psychophysiological insomnia   ??? COVID-19   ??? Acute respiratory failure with hypoxia (CMS-[name])   ??? Anemia in chronic kidney disease   ??? Chronic systolic CHF (congestive heart failure) (CMS-[name])   ??? Gout   ??? Iron deficiency anemia   ??? Coagulation defect, unspecified (CMS-[name])   ??? Non-ST elevation (NSTEMI) myocardial infarction (CMS-[name])   ??? Dilated cardiomyopathy (CMS-[name])       Past Surgical History:   Procedure Laterality Date   ??? CARDIAC CATHETERIZATION  03/08/2007   ??? CORONARY STENT PLACEMENT     ??? CYSTOURETHROSCOPY  12/31/2009     Microscopic hematuria   ??? KNEE SURGERY Right 09/23/2006    arthroscopic knee surgery medial and lateral meniscus tears as well as crystal disease   ??? PR CATH PLACE/CORON ANGIO, IMG SUPER/INTERP,R&L HRT CATH, L HRT VENTRIC N/A 02/13/2017    Procedure: Left/Right Heart Catheterization;  Surgeon: Marlaine Hind, MD;  Location: [facility] CATH;  Service: Cardiology   ??? PR CATH PLACE/CORON ANGIO, IMG SUPER/INTERP,W LEFT HEART VENTRICULOGRAPHY N/A 03/17/2017    Procedure: Left Heart Catheterization W Intervention;  Surgeon: Marlaine Hind, MD;  Location: [facility] CATH;  Service: Cardiology   ??? PR CATH PLACE/CORON ANGIO, IMG SUPER/INTERP,W LEFT HEART VENTRICULOGRAPHY N/A 05/12/2018    Procedure: CATH LEFT HEART CATHETERIZATION W INTERVENTION;  Surgeon: Dorathy Kinsman, MD;  Location: [facility] CATH;  Service: Cardiology   ??? PR CATH PLACE/CORON ANGIO, IMG SUPER/INTERP,W LEFT HEART VENTRICULOGRAPHY N/A 10/22/2018    Procedure: CATH LEFT HEART CATHETERIZATION W INTERVENTION;  Surgeon: Marlaine Hind, MD;  Location: [facility] CATH;  Service: Cardiology   ??? PR CATH PLACE/CORON ANGIO, IMG SUPER/INTERP,W LEFT HEART VENTRICULOGRAPHY N/A 08/19/2019    Procedure: Left Heart Catheterization W Intervention;  Surgeon: Marlaine Hind, MD;  Location: [facility] CATH;  Service: Cardiology   ??? PR CATH PLACE/CORON ANGIO, IMG SUPER/INTERP,W LEFT HEART VENTRICULOGRAPHY N/A 03/27/2020    Procedure: CATH LEFT HEART CATHETERIZATION W INTERVENTION;  Surgeon: Marlaine Hind, MD;  Location: [facility] CATH;  Service: Cardiology   ??? PR COMPRE EP EVAL ABLTJ 3D MAPG TX SVT N/A 07/19/2015    Catheter ablation of cavotricuspid isthmus for atrial flutter [facility])   ??? PR ECMO/ECLS INITIATION VENO-ARTERIAL N/A 03/19/2017    Procedure: ECMO/ECLS; INITIATION, VENO-ARTERIAL;  Surgeon: Lennie Odor, MD;  Location: MAIN OR [facility];  Service: Cardiac Surgery   ??? PR ECMO/ECLS RMVL PRPH CANNULA OPEN 6 YRS & OLDER Left 03/25/2017    Procedure: ECMO / ECLS PROVIDED BY PHYSICIAN; REMOVAL OF PERIPHERAL (ARTERIAL AND/OR VENOUS) CANNULA(E), OPEN, 6 YEARS AND OLDER;  Surgeon: Alonna Buckler Ikonomidis, MD;  Location: MAIN OR [facility];  Service: Cardiac Surgery   ??? PR INSER HART PACER XVENOUS ATRIAL N/A 05/30/2015    [employer] dual-chamber pacemaker implant (E.Chung)   ??? PR INSERT/PLACE FLOW DIRECT CATH N/A 03/17/2017    Procedure: Insert Leave In [place];  Surgeon: Marlaine Hind, MD;  Location: [facility] CATH;  Service: Cardiology   ??? PR INSJ/RPLCMT PERM DFB W/TRNSVNS LDS 1/DUAL CHMBR N/A 05/04/2017    Procedure: ICD Implant System (Single/Dual);  Surgeon: Eldred Manges, MD;  Location: [facility] CATH;  Service: Cardiology   ??? PR NEGATIVE PRESSURE WOUND THERAPY DME >50 SQ CM Left 03/25/2017    Procedure: Neg Press Wound Tx (Vac Assist) Incl Topicals, Per Session, Tsa Greater  Than/= 50 Cm Squared;  Surgeon: Alonna Buckler Ikonomidis, MD;  Location: MAIN OR [facility];  Service: Cardiac Surgery   ??? PR PRQ TRLUML CORONARY STENT W/ANGIO ONE ART/BRNCH N/A 03/12/2018    Procedure: Percutaneous Coronary Intervention;  Surgeon: Marlaine Hind, MD;  Location: [facility] CATH;  Service: Cardiology   ??? PR PRQ TRLUML CORONARY STENT W/ANGIO ONE ART/BRNCH N/A 01/27/2020    Procedure: Percutaneous Coronary Intervention;  Surgeon: Marlaine Hind, MD;  Location: [facility] CATH;  Service: Cardiology   ??? PR REBL VES DIRECT,LOW EXTREM Left 03/19/2017    Procedure: Repr Bld Vessel Direct; Lower Extrem;  Surgeon: Boykin Reaper, MD;  Location: MAIN OR [facility];  Service: Vascular   ??? PR REMV ART CLOT ILIAC-POP,LEG INCIS Left 03/25/2017    Procedure: EMBOLECTOMY OR THROMBECTOMY, WITH OR WITHOUT CATHETER; FEMOROPOPLITEAL, AORTOILIAC ARTERY, BY LEG INCISION;  Surgeon: Alonna Buckler Ikonomidis, MD;  Location: MAIN OR [facility];  Service: Cardiac Surgery   ??? PR UPPER GI ENDOSCOPY,BIOPSY N/A 02/28/2016    Procedure: UGI ENDOSCOPY; WITH BIOPSY, SINGLE OR MULTIPLE;  Surgeon: Liane Comber, MD;  [facility];  Service: Gastroenterology   ??? PR UPPER GI ENDOSCOPY,DIAGNOSIS N/A 05/07/2016    Procedure: UGI ENDO, INCLUDE ESOPHAGUS, STOMACH, & DUODENUM &/OR JEJUNUM; DX W/WO COLLECTION SPECIMN, BY BRUSH OR WASH;  Surgeon: Modena Nunnery, MD;  [facility];  Service: Gastroenterology       No current facility-administered medications for this encounter.    Current Outpatient Medications:   ???  acetaminophen (TYLENOL) 500 MG tablet, TAKE 2 TABLETS (1000MG ) BY MOUTH EVERY 8 HOURS AS NEEDED FOR PAIN, Disp: 180 tablet, Rfl: 0  ???  albuterol HFA 90 mcg/actuation inhaler, Inhale 2 puffs into the lungs every 6 (six) hours as needed for wheezing or shortness of breath., Disp: 18 g, Rfl: 2  ???  alirocumab (PRALUENT) 150 mg/mL subcutaneous injection, Inject the contents of 1 pen (150 mg total) under the skin every fourteen (14) days., Disp: 6 mL, Rfl: 3  ???  allopurinoL (ZYLOPRIM) 100 MG tablet, Take 2 tablets (200 mg total) by mouth daily., Disp: 180 tablet, Rfl: 3  ???  amiodarone (PACERONE) 200 MG tablet, Take 1 tablet (200 mg total) by mouth 2 (two) times daily., Disp: 60 tablet, Rfl: 1  ???  apixaban (ELIQUIS) 5 mg Tab, Take 1 tablet (5 mg total) by mouth Two (2) times a day., Disp: 60 tablet, Rfl: 11  ???  atorvastatin (LIPITOR) 80 MG tablet, Take 1 tablet (80 mg total) by mouth daily., Disp: 90 tablet, Rfl: 3  ???  back brace Misc, 1 each by Miscellaneous route daily as needed. (Patient not taking: Reported on 05/02/2020), Disp: 1 each, Rfl: 0  ???  blood sugar diagnostic Strp, Use as directed to check blood glucose Four (4) times a day (before meals and nightly)., Disp: 300 each, Rfl: 3  ???  blood-glucose meter kit, Disp. blood glucose meter kit preferred by patient's insurance. Dx: Diabetes, E11.9, Disp: 1 each, Rfl: 11  ???  bumetanide (BUMEX) 2 MG tablet, Take 1 tablet (2 mg total) by mouth two (2) times a day. On non dialysis days, Disp: 180 tablet, Rfl: 3  ???  cholecalciferol, vitamin D3 25 mcg, 1,000 units,, 1,000 unit (25 mcg) tablet, Take 1 tablet (25 mcg total) by mouth daily., Disp: 100 tablet, Rfl: 11  ???  diclofenac sodium (VOLTAREN) 1 % gel, Apply 2 grams topically Three (3) times a day., Disp: 180 g, Rfl:  11  ???  diphenhydrAMINE (BENADRYL) 25 mg tablet, Take 1 tablet (25 mg total) by mouth nightly as needed for sleep for up to 5 days., Disp: 20 tablet, Rfl: 0  ???  empty container (SHARPS-A-GATOR DISPOSAL SYSTEM) Misc, Use as directed for sharps disposal, Disp: 1 each, Rfl: 2  ???  ezetimibe (ZETIA) 10 mg tablet, TAKE 1 TABLET BY MOUTH ONCE DAILY, Disp: 90 tablet, Rfl: 3  ???  FLUoxetine (PROZAC) 40 MG capsule, Take 2 capsules (80 mg total) by mouth daily., Disp: 180 capsule, Rfl: 0  ???  gabapentin (NEURONTIN) 100 MG capsule, Take 1 capsule (100 mg total) by mouth two (2) times a day., Disp: 180 capsule, Rfl: 3  ???  insulin ASPART (NOVOLOG FLEXPEN U-100 INSULIN) 100 unit/mL (3 mL) injection pen, Inject 15 Units under the skin Three (3) times a day before meals., Disp: 45 mL, Rfl: 3  ???  insulin detemir U-100 (LEVEMIR FLEXTOUCH U-100 INSULN) 100 unit/mL (3 mL) injection pen, Inject 30 units under the skin nightly. (Patient taking differently: Inject 35 Units under the skin nightly. ), Disp: 15 mL, Rfl: 3  ???  insulin syringe-needle U-100 0.3 mL 31 gauge x 5/16 Syrg, Use to inject insulin twice daily with meals, Disp: 60 each, Rfl: 0  ???  isosorbide mononitrate (IMDUR) 60 MG 24 hr tablet, Take 1 tablet (60 mg total) by mouth daily. DO NOT TAKE on dialysis days, Disp: 90 tablet, Rfl: 3  ???  lancets Misc, Use as directed to check blood glucose Four (4) times a day (before meals and nightly)., Disp: 300 each, Rfl: 3  ???  lancets Misc, Disp. lancets #200 or amount allowed, Testing QID, Dx: E11.65, Z79.4. (DM type 2 , Insulin Dep), Disp: 200 each, Rfl: 11  ???  loperamide (IMODIUM) 2 mg capsule, Take 1 capsule twice a day and another capsule after each episode of diarrhea, up to 8 pills per day., Disp: 240 capsule, Rfl: 2  ???  methocarbamoL (ROBAXIN) 500 MG tablet, Take 1 tablet (500 mg total) by mouth every eight (8) hours as needed (for back pain) for up to 5 days., Disp: 15 tablet, Rfl: 0  ???  metoprolol succinate (TOPROL XL) 100 MG 24 hr tablet, Take 1 tablet (100 mg total) by mouth daily., Disp: 90 tablet, Rfl: 3  ???  mirtazapine (REMERON) 7.5 MG tablet, Take 1 tablet (7.5 mg total) by mouth nightly., Disp: 90 tablet, Rfl: 0  ???  miscellaneous medical supply (BLOOD PRESSURE CUFF) Misc, Measure BP once a day or if any symptoms, Disp: 1 each, Rfl: 0  ???  nitroglycerin (NITROLINGUAL) 0.4 mg/dose spray, Place 1 spray under the tongue every five (5) minutes as needed for chest pain. pT MUST HAVE SPRAY DUE TO DRY MOUTH AND PILLS DO NOT DISSOLVE, Disp: 12 g, Rfl: 12  ???  omeprazole (PRILOSEC) 40 MG capsule, Take 1 capsule (40 mg total) by mouth Two (2) times a day (30 minutes before a meal)., Disp: 180 capsule, Rfl: 3  ???  ondansetron (ZOFRAN) 4 MG tablet, Take 1 tablet (4 mg total) by mouth every twelve (12) hours as needed., Disp: 15 tablet, Rfl: 0  ???  ranolazine (RANEXA) 500 MG 12 hr tablet, Take 1 tablet (500 mg total) by mouth Two (2) times a day., Disp: 60 tablet, Rfl: 6  ???  semaglutide (OZEMPIC) 0.25 mg or 0.5 mg(2 mg/1.5 mL) PnIj injection, Inject 0.5 mg under the skin every seven (7) days., Disp: 4.5 mL, Rfl: 3  ???  sevelamer (RENVELA) 800 mg tablet, Take 3 tablets (2,400 mg total) by mouth Three (3) times a day with a meal., Disp: 270 tablet, Rfl: 11  ???  ticagrelor (BRILINTA) 90 mg Tab, TAKE 1 TABLET BY MOUTH TWICE DAILY, Disp: 180 tablet, Rfl: 0  ???  UNIFINE PENTIPS 31 gauge x 5/16 (8 mm) Ndle, USE WITH INSULIN AND VICTOZA 5 TIMES DAILY, Disp: 400 each, Rfl: 3    Allergies  Losartan and Morphine    Family History   Problem Relation Age of Onset   ??? Diabetes Mother    ??? Hyperlipidemia Mother    ??? Heart disease Mother    ??? Basal cell carcinoma Mother    ??? Blindness Mother         diabeties   ??? Obesity Mother    ??? Hyperlipidemia Father    ??? Heart disease Father    ??? Lung disease Father    ??? Heart disease Half-Sister    ??? Kidney disease Half-Sister    ??? Gallbladder disease Half-Brother    ??? Obesity Half-Brother    ??? No Known Problems Daughter    ??? No Known Problems Son    ??? Obesity Daughter    ??? Depression Maternal Uncle         Died by suicide in 10/10/17 by gunshot   ??? Melanoma Neg Hx    ??? Squamous cell carcinoma Neg Hx    ??? Macular degeneration Neg Hx    ??? Glaucoma Neg Hx        Social History  Social History     Tobacco Use   ??? Smoking status: Never Smoker   ??? Smokeless tobacco: Current User     Types: Chew   ??? Tobacco comment: 04/10/19:  Dips 3x a day./ /Pt in process of reducing tobacco use,  dips 1 can every 3 weeks,   Vaping Use   ??? Vaping Use: Never used   Substance Use Topics   ??? Alcohol use: Yes     Alcohol/week: 0.0 standard drinks     Comment: rare use: couple times a year, small amount   ??? Drug use: Never       Review of Systems   Constitutional: Negative for fever.  Ears:Negative for pain or ear pulling.   Eyes: Negative for visual changes.  ENT: Negative for sore throat.  Cardiovascular: Negative for chest pain.  Respiratory: Negative for shortness of breath.  Gastrointestinal: Negative for abdominal pain, vomiting or diarrhea.  Genitourinary: Negative for dysuria.  Musculoskeletal: Positive for back pain.  Skin: Negative for rash.  Neurological: Negative for headaches, focal weakness or numbness.    All other systems have been reviewed and are negative except as otherwise documented.     Physical Exam     Vitals:    05/09/20 0656 05/09/20 1139 05/09/20 1329   BP: 197/120 193/105 187/112   Pulse: 99 101 78   Resp: 20 22 20    Temp: 36.8 ??C (98.2 ??F) 36.9 ??C (98.4 ??F)    TempSrc: Oral Oral    SpO2: 98% 100% 98%   Weight: (!) 158.8 kg (350 lb)     Height: 188 cm (6' 2)       Labs Reviewed   COMPREHENSIVE METABOLIC PANEL - Abnormal; Notable for the following components:       Result Value    Potassium 5.0 (*)     Chloride 109 (*)     BUN 32 (*)     Creatinine  5.71 (*)     EGFR CKD-EPI Non-African American, Male 61 (*)     EGFR CKD-EPI African American, Male 47 (*)     Calcium 8.4 (*)     Albumin 3.0 (*)     ALT 7 (*)     Alkaline Phosphatase 118 (*)     All other components within normal limits   CBC W/ AUTO DIFF - Abnormal; Notable for the following components:    RBC 4.16 (*)     HGB 11.6 (*)     HCT 36.5 (*)     RDW 16.8 (*)     All other components within normal limits   HIGH SENSITIVITY TROPONIN I - SINGLE - Normal   CBC W/ DIFFERENTIAL    Narrative:     The following orders were created for panel order CBC w/ Differential.  Procedure Abnormality         Status                     ---------                               -----------         ------                     CBC w/ [id number]]         Abnormal            Final result                 Please view results for these tests on the individual orders.         Constitutional: Alert and oriented. Well appearing and in no distress.  Eyes: Conjunctivae are normal. Normocephalic and atraumatic.       Nose: No congestion.       Mouth/Throat: Mucous membranes are moist.       Neck: No stridor.  Hematological/Lymphatic/Immunilogical: No cervical lymphadenopathy.  Cardiovascular: Normal rate, regular rhythm. Normal and symmetric distal pulses are present in all extremities.  Respiratory: Normal respiratory effort. Breath sounds are normal.  Gastrointestinal: Soft and nontender. There is no CVA tenderness.  Musculoskeletal: Moderate tenderness to palpation diffusely throughout thoracic and cervical spine. Pain is reproducible on palpation. Normal range of motion in all extremities.       Right lower leg: No tenderness or edema.       Left lower leg: No tenderness or edema.  Neurologic: Normal speech and language. No gross focal neurologic deficits are appreciated.  Skin: Skin is warm, dry and intact. No rash noted.  Psychiatric: Mood and affect are normal. Speech and behavior are normal.    EKG   SINUS RHYTHM WITH 1ST DEGREE AV BLOCK   MINIMAL VOLTAGE CRITERIA FOR LVH, Confer BE NORMAL VARIANT ( R in aVL)   POSSIBLE ANTERIOR INFARCT ??(CITED ON OR BEFORE 09-May-2020)   T WAVE ABNORMALITY, CONSIDER LATERAL ISCHEMIA   PROLONGED QT   ABNORMAL ECG   WHEN COMPARED WITH ECG OF 06-May-2020 18:21,   NO SIGNIFICANT CHANGE WAS FOUND       Radiology   Procedure changed from XR Thoracic Spine 2 Views  Final result by Hadley Pen, MD (05/09/20 11:30:13)                Impression:    Unremarkable thoracic spine radiographs.  Narrative:    EXAM: XR THORACIC SPINE 3 VIEWS   DATE: 05/09/2020 11:25 AM   ACCESSION: [id number] UN   DICTATED: 05/09/2020 11:24 AM   INTERPRETATION LOCATION: [facility]     CLINICAL INDICATION: 50 years old Male with back pain ??     COMPARISON: 08/18/2019, 04/23/2017     TECHNIQUE: AP and lateral views of the thoracic spine. Swimmer's lateral view of the cervicothoracic junction.     FINDINGS:   No fracture. Normal vertebral body heights. Normal vertebral alignment. Normal intervertebral disc spaces.     The partially visualized lungs are clear.               Final result by Hadley Pen, MD (05/09/20 11:31:44)                Impression:    -No acute osseous abnormality of the cervical spine            Narrative:    EXAM: XR CERVICAL SPINE AP AND LATERAL   DATE: 05/09/2020   ACCESSION: [id number] UN   DICTATED: 05/09/2020 11:24 AM   INTERPRETATION LOCATION: [facility]     CLINICAL INDICATION: 50 years old Male with pain ??     COMPARISON: None.     TECHNIQUE: AP and lateral views of the cervical spine.     FINDINGS:   Visualization of the cervical spine from the base of the skull to the C5-C6 junction in the lateral view.     No fracture. Anatomic alignment of the spine without listhesis. Minimal degenerative disc disease and facet arthropathy. Soft tissues are unremarkable. Partially visualized thorax with partially visualized cardiac wires and suspected tunnel intravenous catheter.                   I independently visualized these images.    Procedures     Procedure(s) performed: None.      Documentation assistance was provided by Pecola Leisure, Scribe, on May 09, 2020 at 9:29 AM for Orion Crook, FNP    May 10, 2020 6:25 PM. Documentation assistance provided by the scribe. I was present during the time the encounter was recorded. The information recorded by the scribe was done at my direction and has been reviewed and validated by me.            Orion Crook, FNP  05/10/20 1825

## 2020-05-09 NOTE — Unmapped (Signed)
Pt with back pain that goes right down the center of my back   Pain began last night while lying down. Denies any injury or heavy lifting. Pt took Tylenol, heat and shower without relief. Pt short of breath with exertion.

## 2020-05-10 ENCOUNTER — Ambulatory Visit
Admit: 2020-05-10 | Discharge: 2020-05-11 | Payer: MEDICARE | Attending: Student in an Organized Health Care Education/Training Program | Primary: Student in an Organized Health Care Education/Training Program

## 2020-05-10 DIAGNOSIS — M546 Pain in thoracic spine: Principal | ICD-10-CM

## 2020-05-10 DIAGNOSIS — I10 Essential (primary) hypertension: Principal | ICD-10-CM

## 2020-05-10 MED ORDER — METHOCARBAMOL 500 MG TABLET
ORAL_TABLET | Freq: Three times a day (TID) | ORAL | 0 refills | 5.00000 days | Status: CP | PRN
Start: 2020-05-10 — End: 2020-05-15
  Filled 2020-05-10: qty 15, 5d supply, fill #0

## 2020-05-10 NOTE — Unmapped (Signed)
[facility] SAME DAY CLINIC NOTE     05/10/2020    PCP: Kurtis Bushman, MD     Assessment and Plan    Jeremy Oliver was seen today for back pain.    Diagnoses and all orders for this visit:    Acute midline thoracic back pain  Acute mid thoracic and upper back pain of 3 days duration without new radiculopathy. Reassuringly, no fever and normal x-rays to thoracic and cervical spine at ED yesterday despite point tenderness on exam. Good response to methocarbamol at the ED yesterday so will prescribe short course (5 days) of methocarbamol. Have counseled that the should not drive while taking this medication and should hold for sedation. Avoid taking >4000 mg of tylenol daily. RTC if symptoms worsen or fail to improve.   -     methocarbamoL (ROBAXIN) 500 MG tablet; Take 1 tablet (500 mg total) by mouth every eight (8) hours as needed (for back pain) for up to 5 days.    Hypertension, unspecified type  Hypertensive but asymptomatic on exam today.  Suspect secondary to pain and missed dialysis.  Continue to monitor.    Follow up as scheduled or sooner as needed.    Patient was seen and discussed with Dr. Lennie Muckle who is in agreement with the assessment and plan as outlined above.       Subjective    Problem List:  Patient Active Problem List   Diagnosis   ??? 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 Idiopathic chronic gout of left knee   ??? Hypercholesteremia   ??? Diabetes mellitus with coincident hypertension (CMS-[name])   ??? 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 Osteoarthritis   ??? Chronic nonalcoholic liver disease   ??? GERD (gastroesophageal reflux disease)   ??? Mild intermittent asthma without complication   ??? Chronic pain syndrome   ??? Paroxysmal atrial fibrillation (CMS-[name])   ??? OSA (obstructive sleep apnea)   ??? Tobacco use disorder   ??? Status post cardiac pacemaker procedure   ??? Diabetes mellitus with gastroparesis (CMS-[name])   ??? Cardiac pacemaker in situ   ??? Recurrent major depressive disorder, in full remission (CMS-[name])   ??? NSTEMI (non-ST elevation myocardial infarction) (CMS-[name])   ??? Mixed anxiety and depressive disorder   ??? HFrEF (heart failure with reduced ejection fraction) (CMS-[name])   ??? Hyperkalemia   ??? Acquired left foot drop   ??? Decreased sensation of lower extremity, left   ??? Chronic anticoagulation   ??? Type 2 diabetes mellitus with stage 4 chronic kidney disease, with long-term current use of insulin (CMS-[name])   ??? Situational stress   ??? Cough   ??? QT prolongation   ??? ESRD needing dialysis (CMS-[name])   ??? Palliative care by specialist   ??? Angina pectoris (CMS-[name])   ??? Psychophysiological insomnia   ??? COVID-19   ??? Acute respiratory failure with hypoxia (CMS-[name])   ??? Anemia in chronic kidney disease   ??? Chronic systolic CHF (congestive heart failure) (CMS-[name])   ??? Gout   ??? Iron deficiency anemia   ??? Coagulation defect, unspecified (CMS-[name])   ??? Non-ST elevation (NSTEMI) myocardial infarction (CMS-[name])   ??? Dilated cardiomyopathy (CMS-[name])       HPI  Jeremy Oliver Jeremy Oliver is a 50 y.o. year old male with the above problem list who presents to the same day  clinic for   Chief Complaint   Patient presents with   ??? Back Pain     3 days ago woke up in the middle to the night with this back pain, seen in ED yesterday     Patient presents to same-day clinic today for midthoracic and upper back pain after being seen in the ED yesterday for the same.    He reports that this pain started about 3 days ago.  Patient is mid back and upper back, sparing lumbar region.  As chronic L foot drop but has noticed more weakness to the L leg over the past 3 days.     Current pain level is 9/10, throbbing. States he took 6x 500 mg Tylenol pills today. Tried heating pad this morning but feels like it made pain worse.  Hot shower seems to improve back pain somewhat.    Avoiding ibuprofen due to renal and cardiac disease but has been taking some (about 3 tablets at a time).    Seen in the ED yesterday, states he received klonopin, muscle relaxer.  On chart review it appears that he received Norco and methocarbamol as well as lidocaine patch.  X-ray of cervical and thoracic spine which were unremarkable.  CMP with hyperkalemia to 5.0, negative trop, unremarkable CBC.  Was prescribed any medications upon discharge from the ED.    Meds and allergies were reviewed in Epic    ROS: 10 point ROS was performed and is otherwise negative other than mentioned in the HPI    Objective  PE:  Vitals:    05/10/20 1004   BP: 188/108   Pulse: 73   Temp: 36.4 ??C   SpO2: 98%     General: well-appearing in NAD  Eyes: EOMI, sclera clear, PERRL  ENT: OP clear w/o erythema or exudate  CV: regular, no murmurs  Resp: CTAB, no wheezes or crackles, normal WOB  GI: soft, NTND, NABS  MSK: pain with ambulation from chair to exam table, severely tender to mid thoracic and upper back overlying spine. No pain to lumbar region.   Skin: clean and dry, no rashes or lesions noted  Ext: no cyanosis/clubbing/edema  Neuro: alert, follows commands. CN II-XII grossly intact    Procedure: None  See procedure note from this encounter    _______________________________________________________________________________________    Same Day Metric Tracker:    Referral source for today's visit:  Other    Referral to other outpatient urgent services? N/A    Did today's visit result in referral to ED or direct admission? No    Without today's visit, would this patient have required an ED visit or hospitalization? No

## 2020-05-10 NOTE — Unmapped (Signed)
You were seen in same-day clinic for back pain.  Suspect this is musculoskeletal in nature as he had normal x-rays at the ED yesterday.  I have prescribed a limited amount of muscle relaxant to be used as needed at home.  You should not take this medication more frequently than every 8 hours.    You can continue taking Tylenol, but you should not take more than 4000 mg of Tylenol per day.

## 2020-05-10 NOTE — Unmapped (Signed)
Pt covid pos. 04/23/20  Asymptomatic today, >10 days.    Pt was @ED  yesterday. States tx plan is not effective and wants to be evaluated in [facility]  ED Imaging : neck and spine negative  States his chronic left foot droop side, feeling worse and not able to get off BR toilet easily. X 3 days (did report this to ER)     Tingling and numbness in Arms and Hands x 3 days.    States pain is middle lower back up to the middle upper back/neck area. X 3 days  Post ED Has lidocaine patches x 2 on back- between shoulder blades and lower back.  Pt does state a HOT shower makes it feel much better, but as soon as that ends the pain is back.    Took tylenol po x 4 today and pain remains 7/10  Pt able to drive to appt this morning.  NAD noted, pt is concerned if pain gets worse he'll fall , and/or not be able to get up.

## 2020-05-10 NOTE — Unmapped (Signed)
[facility] BPs  BP#1 199/114  74   BP#2 186/108  73  BP#3 178/103  72    Average BP 188/108  73  (please note this as a comment in vitals)

## 2020-05-11 ENCOUNTER — Emergency Department: Payer: Medicare Other

## 2020-05-11 ENCOUNTER — Other Ambulatory Visit: Payer: Self-pay

## 2020-05-11 ENCOUNTER — Inpatient Hospital Stay (HOSPITAL_COMMUNITY)
Admission: EM | Admit: 2020-05-11 | Discharge: 2020-05-15 | DRG: 056 | Disposition: A | Discharge status: Home / Self Care | Payer: Medicare Other | Attending: Internal Medicine | Admitting: Internal Medicine

## 2020-05-11 ENCOUNTER — Emergency Department
Admission: EM | Admit: 2020-05-11 | Discharge: 2020-05-11 | Disposition: A | Discharge status: Other Acute Inpatient Hospital | Payer: Medicare Other | Attending: Emergency Medicine | Admitting: Emergency Medicine

## 2020-05-11 DIAGNOSIS — I255 Ischemic cardiomyopathy: Secondary | ICD-10-CM | POA: Diagnosis not present

## 2020-05-11 DIAGNOSIS — F419 Anxiety disorder, unspecified: Secondary | ICD-10-CM | POA: Diagnosis present

## 2020-05-11 DIAGNOSIS — Z992 Dependence on renal dialysis: Secondary | ICD-10-CM | POA: Diagnosis not present

## 2020-05-11 DIAGNOSIS — R296 Repeated falls: Secondary | ICD-10-CM | POA: Diagnosis present

## 2020-05-11 DIAGNOSIS — G4733 Obstructive sleep apnea (adult) (pediatric): Secondary | ICD-10-CM | POA: Diagnosis not present

## 2020-05-11 DIAGNOSIS — R202 Paresthesia of skin: Secondary | ICD-10-CM | POA: Diagnosis not present

## 2020-05-11 DIAGNOSIS — W19XXXA Unspecified fall, initial encounter: Secondary | ICD-10-CM | POA: Diagnosis present

## 2020-05-11 DIAGNOSIS — E785 Hyperlipidemia, unspecified: Secondary | ICD-10-CM | POA: Diagnosis present

## 2020-05-11 DIAGNOSIS — M109 Gout, unspecified: Secondary | ICD-10-CM | POA: Diagnosis not present

## 2020-05-11 DIAGNOSIS — N186 End stage renal disease: Secondary | ICD-10-CM | POA: Diagnosis present

## 2020-05-11 DIAGNOSIS — Z87891 Personal history of nicotine dependence: Secondary | ICD-10-CM | POA: Insufficient documentation

## 2020-05-11 DIAGNOSIS — M21372 Foot drop, left foot: Secondary | ICD-10-CM | POA: Diagnosis present

## 2020-05-11 DIAGNOSIS — E1122 Type 2 diabetes mellitus with diabetic chronic kidney disease: Secondary | ICD-10-CM | POA: Diagnosis present

## 2020-05-11 DIAGNOSIS — M898X9 Other specified disorders of bone, unspecified site: Secondary | ICD-10-CM | POA: Diagnosis present

## 2020-05-11 DIAGNOSIS — D631 Anemia in chronic kidney disease: Secondary | ICD-10-CM | POA: Diagnosis present

## 2020-05-11 DIAGNOSIS — Z95810 Presence of automatic (implantable) cardiac defibrillator: Secondary | ICD-10-CM

## 2020-05-11 DIAGNOSIS — K219 Gastro-esophageal reflux disease without esophagitis: Secondary | ICD-10-CM | POA: Diagnosis present

## 2020-05-11 DIAGNOSIS — R531 Weakness: Secondary | ICD-10-CM | POA: Insufficient documentation

## 2020-05-11 DIAGNOSIS — E1142 Type 2 diabetes mellitus with diabetic polyneuropathy: Secondary | ICD-10-CM | POA: Diagnosis not present

## 2020-05-11 DIAGNOSIS — Z7902 Long term (current) use of antithrombotics/antiplatelets: Secondary | ICD-10-CM

## 2020-05-11 DIAGNOSIS — I5022 Chronic systolic (congestive) heart failure: Secondary | ICD-10-CM | POA: Diagnosis not present

## 2020-05-11 DIAGNOSIS — F32A Depression, unspecified: Secondary | ICD-10-CM | POA: Diagnosis not present

## 2020-05-11 DIAGNOSIS — R079 Chest pain, unspecified: Secondary | ICD-10-CM

## 2020-05-11 DIAGNOSIS — E1165 Type 2 diabetes mellitus with hyperglycemia: Secondary | ICD-10-CM | POA: Diagnosis not present

## 2020-05-11 DIAGNOSIS — I2510 Atherosclerotic heart disease of native coronary artery without angina pectoris: Secondary | ICD-10-CM | POA: Diagnosis present

## 2020-05-11 DIAGNOSIS — Z9660 Presence of unspecified orthopedic joint implant: Secondary | ICD-10-CM | POA: Diagnosis not present

## 2020-05-11 DIAGNOSIS — G510 Bell's palsy: Secondary | ICD-10-CM | POA: Diagnosis present

## 2020-05-11 DIAGNOSIS — Z7901 Long term (current) use of anticoagulants: Secondary | ICD-10-CM | POA: Insufficient documentation

## 2020-05-11 DIAGNOSIS — I10 Essential (primary) hypertension: Secondary | ICD-10-CM | POA: Diagnosis present

## 2020-05-11 DIAGNOSIS — I480 Paroxysmal atrial fibrillation: Secondary | ICD-10-CM | POA: Diagnosis present

## 2020-05-11 DIAGNOSIS — M549 Dorsalgia, unspecified: Secondary | ICD-10-CM | POA: Insufficient documentation

## 2020-05-11 DIAGNOSIS — G9589 Other specified diseases of spinal cord: Secondary | ICD-10-CM | POA: Insufficient documentation

## 2020-05-11 DIAGNOSIS — Z79891 Long term (current) use of opiate analgesic: Secondary | ICD-10-CM

## 2020-05-11 DIAGNOSIS — I420 Dilated cardiomyopathy: Secondary | ICD-10-CM | POA: Diagnosis not present

## 2020-05-11 DIAGNOSIS — I5042 Chronic combined systolic (congestive) and diastolic (congestive) heart failure: Secondary | ICD-10-CM | POA: Diagnosis not present

## 2020-05-11 DIAGNOSIS — R410 Disorientation, unspecified: Secondary | ICD-10-CM | POA: Diagnosis present

## 2020-05-11 DIAGNOSIS — U071 COVID-19: Secondary | ICD-10-CM | POA: Diagnosis not present

## 2020-05-11 DIAGNOSIS — R222 Localized swelling, mass and lump, trunk: Secondary | ICD-10-CM | POA: Diagnosis present

## 2020-05-11 DIAGNOSIS — Z794 Long term (current) use of insulin: Secondary | ICD-10-CM

## 2020-05-11 DIAGNOSIS — Z833 Family history of diabetes mellitus: Secondary | ICD-10-CM

## 2020-05-11 DIAGNOSIS — Z79899 Other long term (current) drug therapy: Secondary | ICD-10-CM

## 2020-05-11 DIAGNOSIS — R569 Unspecified convulsions: Secondary | ICD-10-CM | POA: Diagnosis not present

## 2020-05-11 DIAGNOSIS — I482 Chronic atrial fibrillation, unspecified: Secondary | ICD-10-CM | POA: Diagnosis not present

## 2020-05-11 DIAGNOSIS — I252 Old myocardial infarction: Secondary | ICD-10-CM

## 2020-05-11 DIAGNOSIS — R29898 Other symptoms and signs involving the musculoskeletal system: Secondary | ICD-10-CM | POA: Diagnosis not present

## 2020-05-11 DIAGNOSIS — I132 Hypertensive heart and chronic kidney disease with heart failure and with stage 5 chronic kidney disease, or end stage renal disease: Secondary | ICD-10-CM | POA: Diagnosis present

## 2020-05-11 DIAGNOSIS — G8929 Other chronic pain: Secondary | ICD-10-CM

## 2020-05-11 DIAGNOSIS — Z6841 Body Mass Index (BMI) 40.0 and over, adult: Secondary | ICD-10-CM

## 2020-05-11 DIAGNOSIS — Z885 Allergy status to narcotic agent status: Secondary | ICD-10-CM

## 2020-05-11 DIAGNOSIS — G8194 Hemiplegia, unspecified affecting left nondominant side: Principal | ICD-10-CM | POA: Diagnosis present

## 2020-05-11 DIAGNOSIS — Z720 Tobacco use: Secondary | ICD-10-CM

## 2020-05-11 DIAGNOSIS — Z955 Presence of coronary angioplasty implant and graft: Secondary | ICD-10-CM

## 2020-05-11 DIAGNOSIS — Z888 Allergy status to other drugs, medicaments and biological substances status: Secondary | ICD-10-CM

## 2020-05-11 DIAGNOSIS — R404 Transient alteration of awareness: Secondary | ICD-10-CM

## 2020-05-11 LAB — CBC WITH DIFFERENTIAL/PLATELET
Abs Immature Granulocytes: 0.03 10*3/uL (ref 0.00–0.07)
Basophils Absolute: 0 10*3/uL (ref 0.0–0.1)
Basophils Relative: 0 %
Eosinophils Absolute: 0.1 10*3/uL (ref 0.0–0.5)
Eosinophils Relative: 1 %
HCT: 34.9 % — ABNORMAL LOW (ref 39.0–52.0)
Hemoglobin: 11.1 g/dL — ABNORMAL LOW (ref 13.0–17.0)
Immature Granulocytes: 0 %
Lymphocytes Relative: 10 %
Lymphs Abs: 1 10*3/uL (ref 0.7–4.0)
MCH: 28.4 pg (ref 26.0–34.0)
MCHC: 31.8 g/dL (ref 30.0–36.0)
MCV: 89.3 fL (ref 80.0–100.0)
Monocytes Absolute: 0.7 10*3/uL (ref 0.1–1.0)
Monocytes Relative: 8 %
Neutro Abs: 7.9 10*3/uL — ABNORMAL HIGH (ref 1.7–7.7)
Neutrophils Relative %: 81 %
Platelets: 218 10*3/uL (ref 150–400)
RBC: 3.91 MIL/uL — ABNORMAL LOW (ref 4.22–5.81)
RDW: 16 % — ABNORMAL HIGH (ref 11.5–15.5)
WBC: 9.8 10*3/uL (ref 4.0–10.5)
nRBC: 0 % (ref 0.0–0.2)

## 2020-05-11 LAB — TROPONIN I (HIGH SENSITIVITY)
Troponin I (High Sensitivity): 31 ng/L — ABNORMAL HIGH (ref <18)
Troponin I (High Sensitivity): 29 ng/L — ABNORMAL HIGH (ref <18)

## 2020-05-11 LAB — COMPREHENSIVE METABOLIC PANEL
ALT: 9 U/L (ref 0–44)
AST: 13 U/L — ABNORMAL LOW (ref 15–41)
Albumin: 2.9 g/dL — ABNORMAL LOW (ref 3.5–5.0)
Alkaline Phosphatase: 76 U/L (ref 38–126)
Anion gap: 11 (ref 5–15)
BUN: 39 mg/dL — ABNORMAL HIGH (ref 6–20)
CO2: 20 mmol/L — ABNORMAL LOW (ref 22–32)
Calcium: 7.6 mg/dL — ABNORMAL LOW (ref 8.9–10.3)
Chloride: 106 mmol/L (ref 98–111)
Creatinine, Ser: 5.36 mg/dL — ABNORMAL HIGH (ref 0.61–1.24)
GFR, Estimated: 12 mL/min — ABNORMAL LOW (ref >60)
Glucose, Bld: 161 mg/dL — ABNORMAL HIGH (ref 70–99)
Potassium: 4.7 mmol/L (ref 3.5–5.1)
Sodium: 137 mmol/L (ref 135–145)
Total Bilirubin: 0.5 mg/dL (ref 0.3–1.2)
Total Protein: 6.6 g/dL (ref 6.5–8.1)

## 2020-05-11 LAB — PROTIME-INR
INR: 1.2 (ref 0.8–1.2)
Prothrombin Time: 14.6 seconds (ref 11.4–15.2)

## 2020-05-11 LAB — APTT: aPTT: 57 seconds — ABNORMAL HIGH (ref 24–36)

## 2020-05-11 MED ORDER — MORPHINE SULFATE (PF) 2 MG/ML IV SOLN
2.0000 mg | Freq: Four times a day (QID) | INTRAVENOUS | Status: DC | PRN
  Administered 2020-05-11: 2 mg via INTRAVENOUS
  Filled 2020-05-11: qty 1

## 2020-05-11 MED ORDER — HYDROMORPHONE HCL 1 MG/ML IJ SOLN
0.5000 mg | Freq: Once | INTRAMUSCULAR | Status: AC
  Administered 2020-05-11: 0.5 mg via INTRAVENOUS
  Filled 2020-05-11: qty 1

## 2020-05-11 MED ORDER — ZOLPIDEM TARTRATE 5 MG PO TABS: 5.0000 mg | ORAL_TABLET | Freq: Every evening | ORAL | Status: DC | PRN

## 2020-05-11 MED ORDER — CALCIUM CARBONATE ANTACID 1250 MG/5ML PO SUSP
500.0000 mg | Freq: Four times a day (QID) | ORAL | Status: DC | PRN
  Filled 2020-05-11: qty 5

## 2020-05-11 MED ORDER — IOHEXOL 350 MG/ML SOLN
125.0000 mL | Freq: Once | INTRAVENOUS | Status: AC | PRN
  Administered 2020-05-11: 125 mL via INTRAVENOUS

## 2020-05-11 MED ORDER — ONDANSETRON HCL 4 MG/2ML IJ SOLN
4.0000 mg | Freq: Once | INTRAMUSCULAR | Status: AC
  Administered 2020-05-11: 4 mg via INTRAVENOUS
  Filled 2020-05-11: qty 2

## 2020-05-11 MED ORDER — INSULIN ASPART 100 UNIT/ML ~~LOC~~ SOLN
0.0000 [IU] | Freq: Three times a day (TID) | SUBCUTANEOUS | Status: DC
  Administered 2020-05-12: 2 [IU] via SUBCUTANEOUS
  Administered 2020-05-12 – 2020-05-15 (×4): 1 [IU] via SUBCUTANEOUS

## 2020-05-11 MED ORDER — INSULIN ASPART 100 UNIT/ML ~~LOC~~ SOLN: 0.0000 [IU] | Freq: Every day | SUBCUTANEOUS | Status: DC

## 2020-05-11 MED ORDER — SORBITOL 70 % SOLN
30.0000 mL | Status: DC | PRN
  Filled 2020-05-11: qty 30

## 2020-05-11 MED ORDER — HYDROXYZINE HCL 25 MG PO TABS: 25.0000 mg | ORAL_TABLET | Freq: Three times a day (TID) | ORAL | Status: DC | PRN

## 2020-05-11 MED ORDER — CAMPHOR-MENTHOL 0.5-0.5 % EX LOTN
1.0000 | TOPICAL_LOTION | Freq: Three times a day (TID) | CUTANEOUS | Status: DC | PRN
Start: 2020-05-11 — End: 2020-05-12
  Filled 2020-05-11: qty 222

## 2020-05-11 MED ORDER — HYDROMORPHONE HCL 1 MG/ML IJ SOLN
0.5000 mg | Freq: Once | INTRAMUSCULAR | Status: AC
Start: 2020-05-11 — End: 2020-05-11
  Administered 2020-05-11: 0.5 mg via INTRAVENOUS
  Filled 2020-05-11: qty 1

## 2020-05-11 MED ORDER — ACETAMINOPHEN 650 MG RE SUPP: 650.0000 mg | Freq: Four times a day (QID) | RECTAL | Status: DC | PRN

## 2020-05-11 MED ORDER — ONDANSETRON HCL 4 MG PO TABS: 4.0000 mg | ORAL_TABLET | Freq: Four times a day (QID) | ORAL | Status: DC | PRN

## 2020-05-11 MED ORDER — ACETAMINOPHEN 325 MG PO TABS
650.0000 mg | ORAL_TABLET | Freq: Four times a day (QID) | ORAL | Status: DC | PRN
  Administered 2020-05-11 – 2020-05-15 (×6): 650 mg via ORAL
  Filled 2020-05-11 (×7): qty 2

## 2020-05-11 MED ORDER — ONDANSETRON HCL 4 MG/2ML IJ SOLN: 4.0000 mg | Freq: Four times a day (QID) | INTRAMUSCULAR | Status: DC | PRN

## 2020-05-11 MED ORDER — NEPRO/CARBSTEADY PO LIQD
237.0000 mL | Freq: Three times a day (TID) | ORAL | Status: DC | PRN
  Filled 2020-05-11: qty 237

## 2020-05-11 MED ORDER — DOCUSATE SODIUM 283 MG RE ENEM
1.0000 | ENEMA | RECTAL | Status: DC | PRN
  Filled 2020-05-11: qty 1

## 2020-05-11 MED ORDER — HEPARIN SODIUM (PORCINE) 5000 UNIT/ML IJ SOLN
5000.0000 [IU] | Freq: Three times a day (TID) | INTRAMUSCULAR | Status: DC
  Administered 2020-05-12 (×2): 5000 [IU] via SUBCUTANEOUS
  Filled 2020-05-11 (×2): qty 1

## 2020-05-11 NOTE — ED Provider Notes (Addendum)
50 year old male here with back pain and left hand weakness, leg weakness after fall.  Imaging shows possible paraspinal tumor versus soft tissue mass.  Neurosurgery Dr. Lacinda Axon consulted here and recommends emergent MRI but we are unable to obtain MRI due to patient's ICD.  Both [facility] and [facility] were consulted and refused the patient due to lack of available beds.  Discussed case with neurosurgery at [facility], with PA venae under Dr. Kathyrn Sheriff, who has agreed to evaluate the patient.  Will transfer ER to ER for MRI and appropriate disposition.  Dr. Sabra Heck of [facility] has excepted.  Patient updated and in agreement.   Will need MR Brain, C-Spine, T-Spine. Pt has h/o CKD so unable to give contrast. These were ordered prior to transfer.  Duffy Bruce, MD 05/11/20 1653    Duffy Bruce, MD 05/11/20 [phone]

## 2020-05-11 NOTE — ED Notes (Signed)
Pt c/o severe chest pain, reports feeling like previous MI. EKG completed in the hallway. Morphine given with mild relief. Pt moved into room.

## 2020-05-11 NOTE — ED Notes (Signed)
Pt provided phone to call wife. Noted to be ambulatory to restroom with steady gait.

## 2020-05-11 NOTE — ED Notes (Signed)
Pt asking for pain medication stronger than tylenol since that doesn't help. Message sent to admitting. Pt currently eating a sandwich, reports the pain goes from chest into back

## 2020-05-11 NOTE — H&P (Signed)
History and Physical   Edwin [name] [id number] DOB: [birth year] DOA: 05/11/2020  Referring MD/NP/PA: Noemi Chapel, MD  PCP: Caryl Never, MD   Outpatient Specialists: None  Patient coming from: [facility]  Chief Complaint: Back pain and shortness of breath  HPI: Edwin [name] is a 50 y.o. male with medical history significant of recent diagnosis of COVID-19 on January 24, morbid obesity, end-stage renal disease on hemodialysis Mondays Mondays Fridays, diabetes, chronic systolic heart failure, hyperlipidemia, hypertension, ischemic cardiomyopathy, paroxysmal atrial fibrillation who was sent over from [facility] for MRI of the T-spine.  Patient apparently has been having back pain for about a week.  Mainly in the mid back.  He had a fall last Wednesday and the pain got worse.  He went to the ER where he complained of left dropfoot.  Also left-sided numbness.  He also complains of weakness in his left leg.  Patient had CT of the chest and incidentally a T1 lesion was found.  Suspected to be a mass or an aggressive tumor.  Patient has an implantable pacemaker from previous cardiac events.  He could not get MRI over there so he was sent over here where we can do MRI incompatible with his pacemaker.  Neurosurgery involved.  Patient is therefore being admitted to the hospital for evaluation and MRI in the morning.  It is continuously coughing and appears to be a little short of breath.  Patient will be admitted for further evaluation and treatment and for observation..  ED Course: Temperature is 98 blood pressure 141/61 pulse 77 respiratory rate of 22 oxygen sats 91% on room air.  He has a white count of 9.8, hemoglobin 11.1 and platelets 218.  Chemistry showed creatinine of 5.36, glucose 161.  Chest x-ray showed no acute distress.  CT cervical spine and CT head and CT angiogram of the chest was on remarkable.  CT T-spine shows destructive lesion  around T1.  Neurosurgery was consulted [facility] where they recommend MRI.  Patient is therefore transferred here for MRI.  Review of Systems: As per HPI otherwise 10 point review of systems negative.    Past Medical History:  Diagnosis Date  . CAD (coronary artery disease)   . Chronic systolic heart failure ([place])   . Diabetes mellitus without complication ([place])   . Heart attack ([place])   . Hyperlipidemia   . Hypertension   . Ischemic cardiomyopathy   . Paroxysmal atrial fibrillation ([name])   . Renal disorder     Past Surgical History:  Procedure Laterality Date  . [employer] pacemaker    . CARDIAC SURGERY    . IRRIGATION AND DEBRIDEMENT KNEE Left 12/17/2017   Procedure: LEFT KNEE DEBRIDEMENT AND SYNOVECTOMY;  Surgeon: Thornton Park, MD;  Location: [facility] ORS;  Service: Orthopedics;  Laterality: Left;  . JOINT REPLACEMENT    . LEFT HEART CATH AND CORONARY ANGIOGRAPHY Left 03/09/2018   Procedure: LEFT HEART CATH AND CORONARY ANGIOGRAPHY;  Surgeon: Nelva Bush, MD;  Location: [place] CV LAB;  Service: Cardiovascular;  Laterality: Left;  . LEFT HEART CATH AND CORONARY ANGIOGRAPHY N/A 01/25/2020   Procedure: LEFT HEART CATH AND CORONARY ANGIOGRAPHY;  Surgeon: Isaias Cowman, MD;  Location: [place] CV LAB;  Service: Cardiovascular;  Laterality: N/A;  . PERCUTANEOUS CORONARY STENT INTERVENTION (PCI-S)       reports that he has quit smoking. His smokeless tobacco use includes snuff. He reports that he does not drink alcohol and does not  use drugs.  Allergies  Allergen Reactions  . Losartan     Hyperkalemia (K>6)  . Morphine Anxiety  . Morphine And Related Other (See Comments)    Overdose in hospital per patient report    Family History  Problem Relation Age of Onset  . Diabetes Mother   . Peripheral vascular disease Mother   . COPD Father      Prior to Admission medications   Medication Sig Start Date End Date Taking? Authorizing Provider   acetaminophen (TYLENOL) 500 MG tablet Take 500-1,000 mg by mouth every 6 (six) hours as needed for mild pain, moderate pain or fever.    [provider]  albuterol (PROVENTIL HFA;VENTOLIN HFA) 108 (90 Base) MCG/ACT inhaler Inhale 2 puffs into the lungs every 6 (six) hours as needed for wheezing or shortness of breath. 04/29/18   Schuyler Amor, MD  Alirocumab 150 MG/ML SOAJ Inject 150 mg as directed every 14 (fourteen) days.    [provider]  allopurinol (ZYLOPRIM) 100 MG tablet Take 200 mg by mouth daily. 03/18/18   [provider]  amiodarone (PACERONE) 200 MG tablet Take 1 tablet (200 mg total) by mouth 2 (two) times daily. 04/13/20   Shawna Clamp, MD  amLODipine (NORVASC) 10 MG tablet Take 10 mg by mouth daily. 07/26/19   [provider]  apixaban (ELIQUIS) 5 MG TABS tablet Take 5 mg by mouth 2 (two) times daily.    [provider]  atorvastatin (LIPITOR) 80 MG tablet Take 80 mg by mouth every evening.  09/14/17   [provider]  bumetanide (BUMEX) 2 MG tablet Take 2 mg by mouth 2 (two) times daily. 12/06/19   [provider]  cholecalciferol (VITAMIN D) 1000 units tablet Take 1,000 Units by mouth daily.    [provider]  empagliflozin (JARDIANCE) 10 MG TABS tablet Take 10 mg by mouth daily. 03/12/20   [provider]  ezetimibe (ZETIA) 10 MG tablet Take 10 mg by mouth daily. 10/14/17   [provider]  FLUoxetine (PROZAC) 40 MG capsule Take 80 mg by mouth daily. 12/04/17   [provider]  gabapentin (NEURONTIN) 100 MG capsule Take 1 capsule (100 mg total) by mouth 2 (two) times daily. 04/25/20   Aline August, MD  hydrOXYzine (ATARAX/VISTARIL) 10 MG tablet Take 10 mg by mouth daily. 07/26/19   [provider]  insulin aspart (NOVOLOG FLEXPEN) 100 UNIT/ML FlexPen Inject 22 Units into the skin in the morning. 07/26/19   [provider]  insulin detemir (LEVEMIR) 100 UNIT/ML  injection Inject 0.1 mLs (10 Units total) into the skin at bedtime. 04/25/20   Aline August, MD  isosorbide mononitrate (IMDUR) 30 MG 24 hr tablet Take 2 tablets (60 mg total) by mouth daily. Do not take on dialysis days 04/25/20   Aline August, MD  liraglutide (VICTOZA) 18 MG/3ML SOPN Inject 0.6 mg into the skin once a week. 07/26/19   [provider]  loperamide (IMODIUM) 2 MG capsule Take 2 mg by mouth as needed for diarrhea or loose stools.    [provider]  metoprolol succinate (TOPROL-XL) 100 MG 24 hr tablet Take 100 mg by mouth daily. 01/03/20   [provider]  mirtazapine (REMERON) 7.5 MG tablet Take 7.5 mg by mouth at bedtime.    [provider]  Multiple Vitamin (MULTIVITAMIN WITH MINERALS) TABS tablet Take 1 tablet by mouth daily. 10/31/18   Vaughan Basta, MD  nitroGLYCERIN (NITROLINGUAL) 0.4 MG/SPRAY spray Place 1  spray under the tongue every 5 (five) minutes x 3 doses as needed for chest pain.    [provider]  omeprazole (PRILOSEC) 40 MG capsule Take 40 mg by mouth 2 (two) times daily. 11/25/19   [provider]  ondansetron (ZOFRAN) 4 MG tablet Take 4 mg by mouth every 12 (twelve) hours as needed for nausea. 01/04/20   [provider]  ranolazine (RANEXA) 500 MG 12 hr tablet Take 500 mg by mouth 2 (two) times daily.    [provider]  RENVELA 800 MG tablet Take 2,400 mg by mouth 3 (three) times daily with meals. 10/14/17   [provider]  sevelamer (RENAGEL) 800 MG tablet Take 800 mg by mouth daily. 03/20/20   [provider]  ticagrelor (BRILINTA) 90 MG TABS tablet Take 90 mg by mouth 2 (two) times daily.    [provider]  traZODone (DESYREL) 50 MG tablet Take 50 mg by mouth daily. 07/26/19   [provider]  VOLTAREN 1 % GEL Apply 2 g topically 3 (three) times daily as needed (left leg pain).  12/04/17   [provider]    Physical Exam: Vitals:    05/11/20 1844  BP: (!) 162/103  Pulse: 73  Resp: 16  Temp: 97.6 F (36.4 C)  SpO2: 100%      Constitutional: Morbidly obese, sitting on stretcher in the hallway coughing Vitals:   05/11/20 1844  BP: (!) 162/103  Pulse: 73  Resp: 16  Temp: 97.6 F (36.4 C)  SpO2: 100%   Eyes: PERRL, lids and conjunctivae normal ENMT: Mucous membranes are moist. Posterior pharynx clear of any exudate or lesions.Normal dentition.  Neck: normal, supple, no masses, no thyromegaly Respiratory: Coarse breath sound bilaterally with some rales,, no wheezing, no crackles. Normal respiratory effort. No accessory muscle use.  Cardiovascular: Irregularly irregular no tachycardia no murmurs / rubs / gallops. No extremity edema. 2+ pedal pulses. No carotid bruits.  Abdomen: Obese, no tenderness, no masses palpated. No hepatosplenomegaly. Bowel sounds positive.  Musculoskeletal: no clubbing / cyanosis. No joint deformity upper and lower extremities. Good ROM, no contractures. Normal muscle tone.  Skin: no rashes, lesions, ulcers. No induration Neurologic: CN 2-12 grossly intact. Sensation intact, DTR normal. Strength 5/5 in all 4.  Psychiatric: Normal judgment and insight. Alert and oriented x 3.  Depressed mood.     Labs on Admission: I have personally reviewed following labs and imaging studies  CBC: Recent Labs  Lab 05/11/20 0909  WBC 9.8  NEUTROABS 7.9*  HGB 11.1*  HCT 34.9*  MCV 89.3  PLT 99991111   Basic Metabolic Panel: Recent Labs  Lab 05/11/20 0909  NA 137  K 4.7  CL 106  CO2 20*  GLUCOSE 161*  BUN 39*  CREATININE 5.36*  CALCIUM 7.6*   GFR: Estimated Creatinine Clearance: 25.9 mL/min (A) (by C-G formula based on SCr of 5.36 mg/dL (H)). Liver Function Tests: Recent Labs  Lab 05/11/20 0909  AST 13*  ALT 9  ALKPHOS 76  BILITOT 0.5  PROT 6.6  ALBUMIN 2.9*   No results for input(s): LIPASE, AMYLASE in the last 168 hours. No results for input(s): AMMONIA in the last 168  hours. Coagulation Profile: Recent Labs  Lab 05/11/20 0909  INR 1.2   Cardiac Enzymes: No results for input(s): CKTOTAL, CKMB, CKMBINDEX, TROPONINI in the last 168 hours. BNP (last 3 results) No results for input(s): PROBNP in the last 8760 hours. HbA1C: No results for input(s): HGBA1C in  the last 72 hours. CBG: No results for input(s): GLUCAP in the last 168 hours. Lipid Profile: No results for input(s): CHOL, HDL, LDLCALC, TRIG, CHOLHDL, LDLDIRECT in the last 72 hours. Thyroid Function Tests: No results for input(s): TSH, T4TOTAL, FREET4, T3FREE, THYROIDAB in the last 72 hours. Anemia Panel: No results for input(s): VITAMINB12, FOLATE, FERRITIN, TIBC, IRON, RETICCTPCT in the last 72 hours. Urine analysis:    Component Value Date/Time   COLORURINE STRAW (A) 10/28/2018 2145   APPEARANCEUR CLEAR (A) 10/28/2018 2145   LABSPEC 1.008 10/28/2018 2145   PHURINE 5.0 10/28/2018 2145   GLUCOSEU 50 (A) 10/28/2018 2145   HGBUR SMALL (A) 10/28/2018 2145   BILIRUBINUR NEGATIVE 10/28/2018 2145   [place] NEGATIVE 10/28/2018 2145   PROTEINUR 100 (A) 10/28/2018 2145   NITRITE NEGATIVE 10/28/2018 2145   LEUKOCYTESUR NEGATIVE 10/28/2018 2145   Sepsis Labs: '@LABRCNTIP'$ (procalcitonin:4,lacticidven:4) )No results found for this or any previous visit (from the past 240 hour(s)).   Radiological Exams on Admission: CT Head Wo Contrast  Result Date: 05/11/2020 CLINICAL DATA:  Recent fall EXAM: CT HEAD WITHOUT CONTRAST TECHNIQUE: Contiguous axial images were obtained from the base of the skull through the vertex without intravenous contrast. COMPARISON:  04/29/2020 FINDINGS: Brain: There is no acute intracranial hemorrhage, mass effect, or edema. Gray-white differentiation is preserved. There is no extra-axial fluid collection. Ventricles and sulci are within normal limits in size and configuration. Vascular: No hyperdense vessel. Minimal atherosclerotic calcification at the skull base. Skull:  Calvarium is unremarkable. Sinuses/Orbits: No acute finding. Other: None. IMPRESSION: No evidence of acute intracranial injury. Electronically Signed   By: Macy Mis M.D.   On: 05/11/2020 11:24   CT Cervical Spine Wo Contrast  Result Date: 05/11/2020 CLINICAL DATA:  Recent fall EXAM: CT CERVICAL SPINE WITHOUT CONTRAST TECHNIQUE: Multidetector CT imaging of the cervical spine was performed without intravenous contrast. Multiplanar CT image reconstructions were also generated. COMPARISON:  None. FINDINGS: There is artifact related to body habitus. Alignment: Anteroposterior alignment is preserved. Skull base and vertebrae: No acute cervical spine fracture. Vertebral body heights are maintained. Soft tissues and spinal canal: No prevertebral fluid or swelling. The spinal canal is obscured below C2-C3. Disc levels: Intervertebral disc heights are maintained. No degenerative stenosis. Upper chest: Chest dated separately. Other: None. IMPRESSION: No acute cervical spine fracture. Electronically Signed   By: Macy Mis M.D.   On: 05/11/2020 11:22   CT T-SPINE NO CHARGE  Result Date: 05/11/2020 CLINICAL DATA:  Recent fall EXAM: CT THORACIC SPINE WITHOUT CONTRAST TECHNIQUE: Multidetector CT images of the thoracic were obtained using the standard protocol without intravenous contrast. COMPARISON:  None. FINDINGS: Artifact is present secondary to body habitus. Alignment: Preserved. Vertebrae: Vertebral body heights are maintained. There is soft tissue replacing a portion of the posterior T1 vertebral body with erosion of cortex and potential epidural extension. There is no acute fracture identified. Paraspinal and other soft tissues: Extra-spinal findings are better evaluated on concurrent dedicated imaging. Disc levels: Intervertebral disc heights are maintained. No significant osseous encroachment on the spinal canal or neural foramina. IMPRESSION: No acute fracture. Soft tissue replacing part of the  posterior T1 vertebral body with erosion of cortex and potential epidural extension. MRI is recommended for further evaluation. Electronically Signed   By: Macy Mis M.D.   On: 05/11/2020 11:39   DG Chest Port 1 View  Result Date: 05/11/2020 CLINICAL DATA:  50 year old male with chest pain while at dialysis. EXAM: PORTABLE CHEST 1 VIEW COMPARISON:  Portable chest  04/29/2020 and earlier. FINDINGS: Portable AP upright view at 0944 hours. Stable left chest AICD, right chest dialysis catheter. Mildly larger, improved lung volumes. Mild cardiomegaly. Other mediastinal contours are within normal limits. Visualized tracheal air column is within normal limits. No pneumothorax. Allowing for portable technique the lungs are clear. No acute osseous abnormality identified. IMPRESSION: No acute cardiopulmonary abnormality. Electronically Signed   By: Genevie Ann M.D.   On: 05/11/2020 10:02   CT Angio Chest/Abd/Pel for Dissection W and/or Wo Contrast  Result Date: 05/11/2020 CLINICAL DATA:  Chest and abdominal pain. EXAM: CT ANGIOGRAPHY CHEST, ABDOMEN AND PELVIS TECHNIQUE: Non-contrast CT of the chest was initially obtained. Multidetector CT imaging through the chest, abdomen and pelvis was performed using the standard protocol during bolus administration of intravenous contrast. Multiplanar reconstructed images and MIPs were obtained and reviewed to evaluate the vascular anatomy. CONTRAST:  161m OMNIPAQUE IOHEXOL 350 MG/ML SOLN COMPARISON:  July 08, 2018. FINDINGS: CTA CHEST FINDINGS Cardiovascular: Preferential opacification of the thoracic aorta. No evidence of thoracic aortic aneurysm or dissection. Mild cardiomegaly is noted. Coronary artery calcifications are noted. No pericardial effusion. Mediastinum/Nodes: No enlarged mediastinal, hilar, or axillary lymph nodes. Thyroid gland, trachea, and esophagus demonstrate no significant findings. Lungs/Pleura: Lungs are clear. No pleural effusion or pneumothorax.  Musculoskeletal: No chest wall abnormality. No acute or significant osseous findings. Review of the MIP images confirms the above findings. CTA ABDOMEN AND PELVIS FINDINGS VASCULAR Aorta: Atherosclerosis of abdominal aorta is noted without aneurysm or dissection. Celiac: Patent without evidence of aneurysm, dissection, vasculitis or significant stenosis. SMA: Patent without evidence of aneurysm, dissection, vasculitis or significant stenosis. Renals: 2 renal arteries are noted on each side, and there is no evidence of significant stenosis or thrombosis present. IMA: Patent without evidence of aneurysm, dissection, vasculitis or significant stenosis. Inflow: Patent without evidence of aneurysm, dissection, vasculitis or significant stenosis. Veins: No obvious venous abnormality within the limitations of this arterial phase study. Review of the MIP images confirms the above findings. NON-VASCULAR Hepatobiliary: No focal liver abnormality is seen. No gallstones, gallbladder wall thickening, or biliary dilatation. Pancreas: Unremarkable. No pancreatic ductal dilatation or surrounding inflammatory changes. Spleen: Normal in size without focal abnormality. Adrenals/Urinary Tract: Stable right adrenal myelolipoma. Left adrenal gland is unremarkable. Bilateral renal atrophy is noted. Stable left renal cyst is noted. No hydronephrosis or renal obstruction is noted. No renal or ureteral calculi are noted. Urinary bladder is unremarkable. Stomach/Bowel: Stomach is within normal limits. Appendix appears normal. No evidence of bowel wall thickening, distention, or inflammatory changes. Lymphatic: No significant adenopathy is noted. Reproductive: Prostate is unremarkable. Other: No abdominal wall hernia or abnormality. No abdominopelvic ascites. Musculoskeletal: No acute or significant osseous findings. Review of the MIP images confirms the above findings. IMPRESSION: 1. No evidence of thoracic or abdominal aortic aneurysm or  dissection. 2. Coronary artery calcifications are noted suggesting coronary artery disease. 3. Bilateral renal atrophy is noted. 4. Stable right adrenal myelolipoma. 5. No acute abnormality seen in the chest, abdomen or pelvis. 6. Aortic atherosclerosis. Aortic Atherosclerosis (ICD10-I70.0). Electronically Signed   By: JMarijo ConceptionM.D.   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  Patient will be admitted for observation.  Plan is to get MRI in the morning.  Depending on the results further work-up will be planned.  #2 end-stage renal disease: Patient gets dialysis Mondays Wednesdays and Fridays.  We will continue  #3 chronic atrial fibrillation: At this point rate is controlled.  Resume home regimen.  #4 COVID-19 viral infection: Patient still within the 21 days infectious..  We will continue with isolation.  Supportive care only.  #5 chronic systolic CHF: At this point stable.  Continue his home regimen  #6 diabetes: Initiate sliding scale insulin  #7 coronary artery disease: At this point appears stable.  Continue supportive care  #8 morbid obesity: Stable no intervention  #9 GERD: Confirm home regimen and continue PPIs   DVT prophylaxis: Heparin Code Status: Full code Family Communication: No family at bedside Disposition Plan: To be determined Consults called: [facility] Admission status: Observation  Severity of Illness: The appropriate patient status for this patient is OBSERVATION. Observation  status is judged to be reasonable and necessary in order to provide the required intensity of service to ensure the patient's safety. The patient's presenting symptoms, physical exam findings, and initial radiographic and laboratory data in the context of their medical condition is felt to place them at decreased risk for further clinical deterioration. Furthermore, it is anticipated that the patient will be medically stable for discharge from the hospital within 2 midnights of admission. The following factors support the patient status of observation.   " The patient's presenting symptoms include back pain. " The physical exam findings include morbidly obese and acutely sick. " The initial radiographic and laboratory data are abnormal CT T-spine.     Barbette Merino MD [facility] Pager [phone]  If 7PM-7AM, please contact night-coverage [email or web address] Password [facility]  05/11/2020, 8:40 PM

## 2020-05-11 NOTE — ED Notes (Signed)
X-ray at bedside

## 2020-05-11 NOTE — ED Notes (Signed)
This RN spoke to the pts wife and explained the reason the MD was trying to transfer pt for a MRI, wife asks "if its not his heart can he just come home and do the [place]". Wife educated that the MD believes his CT was significant enough to warrant a transfer for the MRI for further eval, pt's wife started to yell at this RN. This RN has spoke to wife multiple times to give updates.

## 2020-05-11 NOTE — ED Notes (Signed)
Pt refusing to keep cardiac monitor on due to " I'm trying to sleep". NAD noted.

## 2020-05-11 NOTE — ED Notes (Signed)
[facility] here to transport pt. VSS. Emtala and med neccessity sent with [facility]

## 2020-05-11 NOTE — ED Notes (Signed)
[name], wife, [phone] would like an update when available

## 2020-05-11 NOTE — Progress Notes (Addendum)
  NEUROSURGERY PROGRESS NOTE   Received call from Dr Ellender Hose, EDP [facility], regarding patient. 50 year old male with history of DM, cardiomyopathy, CHF, Afib on Eliquis, ESRD on dialysis [name] who presented to ED with chest pain,back pain, and leg weakness s/p fall. He underwent work up by EDP which including CT T spine and was found to have a destructive, likely soft tissue mass at T1. Neurosurgery was consulted at [facility] and rec MRI, but due to patient's pacemaker, [facility] could not perform the MRI. [facility] were called for transfer and declined patient. We were subsequently called and agreed for patient to be transferred to [facility] for the MRI and to see patient in consultation. Patient has since arrived to [facility] I have updated his MRI to include contrast (knowing his ESRD), as this will give [place] the best characterization of this lesion. Will follow and await results  Ferne Reus, PA-C [facility]

## 2020-05-11 NOTE — ED Notes (Signed)
Pt c/o of increasing chest pain, MD aware and at bedside, new EKG obtained and given to MD. VSS.

## 2020-05-11 NOTE — ED Notes (Signed)
Pt taken to MRI and returned without imaging due to ICD. MD aware

## 2020-05-11 NOTE — ED Provider Notes (Signed)
[facility] Emergency Department Provider Note  ____________________________________________   Event Date/Time   First MD Initiated Contact with Patient 05/11/20 [id number]     (approximate)  I have reviewed the triage vital signs and the nursing notes.   HISTORY  Chief Complaint Chest Pain    HPI Edwin [name] is a 50 y.o. male with diabetes, ischemic cardiomyopathy, A. fib on Eliquis, ESRD on dialysis Monday Wednesday Fridays who comes in with chest pain.  Patient states that he last had his full dialysis session on Monday.  He did not get dialyzed on Wednesday because he had a fall and was seen in outside ER.  Today while getting dialysis he said that initially he had a little bit of chest pain but then about an hour into his session he developed worsening chest discomfort in the middle of his chest that was constant, severe, nothing made it better, nothing made it worse denies any shortness of breath, cough or other concerns.  No abdominal pain.  Patient states that he did have a fall 2 days ago that he was seen in outside hospital.  Reports he continues to have midline back pain since then.  At the outside ER he did have x-rays of his cervical and thoracic region that were negative.  His troponin at that time was 46 but he did not have repeat done.  Patient states that when he fell he fell backwards when his leg gave out.  He states that he did not hit his head and denies any headaches or confusion. Later on patient endorsed that with the back pain he has been having left leg weakness and left arm weakness with some numbness and tingling. He states that he felt like his left leg gave out and that is what caused him to fall and ever since then he has had this numbness and tingling in his arm and leg.  To note patient was Covid +18 days ago as well            Past Medical History:  Diagnosis Date  . CAD (coronary artery disease)   . Chronic systolic heart  failure ([place])   . Diabetes mellitus without complication ([place])   . Heart attack ([place])   . Hyperlipidemia   . Hypertension   . Ischemic cardiomyopathy   . Paroxysmal atrial fibrillation ([name])   . Renal disorder     Patient Active Problem List   Diagnosis Date Noted  . COVID-19 virus infection 04/29/2020  . Recurrent episodes of unresponsiveness 04/23/2020  . Dilated cardiomyopathy ([place]) 04/23/2020  . OSA (obstructive sleep apnea) 04/23/2020  . Syncope 04/12/2020  . Elevated troponin 04/12/2020  . Atrial fibrillation, chronic ([place]) 04/12/2020  . GERD (gastroesophageal reflux disease) 04/12/2020  . Gout 04/12/2020  . Depression 04/12/2020  . Obesity, Class III, BMI 40-49.9 (morbid obesity) ([place])   . Chronic systolic CHF (congestive heart failure) ([place])   . ESRD (end stage renal disease) ([place]) 01/23/2020  . CAD (coronary artery disease) 01/23/2020  . Type 2 diabetes mellitus with ESRD (end-stage renal disease) ([place]) 01/23/2020  . Essential hypertension 01/23/2020  . Hyperlipidemia 01/23/2020  . Atrial fibrillation with rapid ventricular response ([place]) 01/23/2020  . Syncope and collapse 10/28/2018  . Chest pain 07/08/2018  . NSTEMI (non-ST elevated myocardial infarction) ([place]) 03/08/2018  . Infection of left knee ([place]) 12/16/2017    Past Surgical History:  Procedure Laterality Date  . [employer] pacemaker    . CARDIAC SURGERY    .  IRRIGATION AND DEBRIDEMENT KNEE Left 12/17/2017   Procedure: LEFT KNEE DEBRIDEMENT AND SYNOVECTOMY;  Surgeon: Thornton Park, MD;  Location: [facility] ORS;  Service: Orthopedics;  Laterality: Left;  . JOINT REPLACEMENT    . LEFT HEART CATH AND CORONARY ANGIOGRAPHY Left 03/09/2018   Procedure: LEFT HEART CATH AND CORONARY ANGIOGRAPHY;  Surgeon: Nelva Bush, MD;  Location: [place] [facility];  Service: Cardiovascular;  Laterality: Left;  . LEFT HEART CATH AND CORONARY ANGIOGRAPHY N/A 01/25/2020   Procedure: LEFT HEART CATH AND CORONARY  ANGIOGRAPHY;  Surgeon: Isaias Cowman, MD;  Location: [place] [facility];  Service: Cardiovascular;  Laterality: N/A;  . PERCUTANEOUS CORONARY STENT INTERVENTION (PCI-S)      Prior to Admission medications   Medication Sig Start Date End Date Taking? Authorizing Provider  acetaminophen (TYLENOL) 500 MG tablet Take 500-1,000 mg by mouth every 6 (six) hours as needed for mild pain, moderate pain or fever.    [provider]  albuterol (PROVENTIL HFA;VENTOLIN HFA) 108 (90 Base) MCG/ACT inhaler Inhale 2 puffs into the lungs every 6 (six) hours as needed for wheezing or shortness of breath. 04/29/18   Schuyler Amor, MD  Alirocumab 150 MG/ML SOAJ Inject 150 mg as directed every 14 (fourteen) days.    [provider]  allopurinol (ZYLOPRIM) 100 MG tablet Take 200 mg by mouth daily. 03/18/18   [provider]  amiodarone (PACERONE) 200 MG tablet Take 1 tablet (200 mg total) by mouth 2 (two) times daily. 04/13/20   Shawna Clamp, MD  amLODipine (NORVASC) 10 MG tablet Take 10 mg by mouth daily. 07/26/19   [provider]  apixaban (ELIQUIS) 5 MG TABS tablet Take 5 mg by mouth 2 (two) times daily.    [provider]  atorvastatin (LIPITOR) 80 MG tablet Take 80 mg by mouth every evening.  09/14/17   [provider]  bumetanide (BUMEX) 2 MG tablet Take 2 mg by mouth 2 (two) times daily. 12/06/19   [provider]  cholecalciferol (VITAMIN D) 1000 units tablet Take 1,000 Units by mouth daily.    [provider]  empagliflozin (JARDIANCE) 10 MG TABS tablet Take 10 mg by mouth daily. 03/12/20   [provider]  ezetimibe (ZETIA) 10 MG tablet Take 10 mg by mouth daily. 10/14/17   [provider]  FLUoxetine (PROZAC) 40 MG capsule Take 80 mg by mouth daily. 12/04/17   [provider]  gabapentin (NEURONTIN) 100 MG capsule Take 1 capsule (100 mg total) by mouth 2 (two) times daily. 04/25/20   Aline August, MD   hydrOXYzine (ATARAX/VISTARIL) 10 MG tablet Take 10 mg by mouth daily. 07/26/19   [provider]  insulin aspart (NOVOLOG FLEXPEN) 100 UNIT/ML FlexPen Inject 22 Units into the skin in the morning. 07/26/19   [provider]  insulin detemir (LEVEMIR) 100 UNIT/ML injection Inject 0.1 mLs (10 Units total) into the skin at bedtime. 04/25/20   Aline August, MD  isosorbide mononitrate (IMDUR) 30 MG 24 hr tablet Take 2 tablets (60 mg total) by mouth daily. Do not take on dialysis days 04/25/20   Aline August, MD  liraglutide (VICTOZA) 18 MG/3ML SOPN Inject 0.6 mg into the skin once a week. 07/26/19   [provider]  loperamide (IMODIUM) 2 MG capsule Take 2 mg by mouth as needed for diarrhea or loose stools.    [provider]  metoprolol succinate (TOPROL-XL) 100 MG 24 hr tablet Take 100 mg by mouth daily. 01/03/20  [provider]  mirtazapine (REMERON) 7.5 MG tablet Take 7.5 mg by mouth at bedtime.    [provider]  Multiple Vitamin (MULTIVITAMIN WITH MINERALS) TABS tablet Take 1 tablet by mouth daily. 10/31/18   Vaughan Basta, MD  nitroGLYCERIN (NITROLINGUAL) 0.4 MG/SPRAY spray Place 1 spray under the tongue every 5 (five) minutes x 3 doses as needed for chest pain.    [provider]  omeprazole (PRILOSEC) 40 MG capsule Take 40 mg by mouth 2 (two) times daily. 11/25/19   [provider]  ondansetron (ZOFRAN) 4 MG tablet Take 4 mg by mouth every 12 (twelve) hours as needed for nausea. 01/04/20   [provider]  ranolazine (RANEXA) 500 MG 12 hr tablet Take 500 mg by mouth 2 (two) times daily.    [provider]  RENVELA 800 MG tablet Take 2,400 mg by mouth 3 (three) times daily with meals. 10/14/17   [provider]  sevelamer (RENAGEL) 800 MG tablet Take 800 mg by mouth daily. 03/20/20   [provider]  ticagrelor (BRILINTA) 90 MG TABS tablet Take 90 mg by mouth 2 (two) times daily.     [provider]  traZODone (DESYREL) 50 MG tablet Take 50 mg by mouth daily. 07/26/19   [provider]  VOLTAREN 1 % GEL Apply 2 g topically 3 (three) times daily as needed (left leg pain).  12/04/17   [provider]    Allergies Losartan, Morphine, and Morphine and related  Family History  Problem Relation Age of Onset  . Diabetes Mother   . Peripheral vascular disease Mother   . COPD Father     Social History Social History   Tobacco Use  . 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 Smokeless tobacco: Current User    Types: Snuff  Vaping Use  . Vaping Use: Never used  Substance Use Topics  . Alcohol use: Never  . Drug use: Never      Review of Systems Constitutional: No fever/chills Eyes: No visual changes. ENT: No sore throat. Cardiovascular: Positive chest pain Respiratory: Denies shortness of breath. Gastrointestinal: No abdominal pain.  No nausea, no vomiting.  No diarrhea.  No constipation. Genitourinary: Negative for dysuria. Musculoskeletal: Positive back pain  Skin: Negative for rash. Neurological: Left arm and leg weakness and tingling All other ROS negative ____________________________________________   PHYSICAL EXAM:  VITAL SIGNS: ED Triage Vitals  Enc Vitals Group     BP --      Pulse Rate 05/11/20 0904 70     Resp 05/11/20 0904 14     Temp 05/11/20 0904 97.7 F (36.5 C)     Temp Source 05/11/20 0904 Oral     SpO2 05/11/20 0904 99 %     Weight 05/11/20 0906 (!) 348 lb 5.2 oz (158 kg)     Height 05/11/20 0906 '6\' 2"'$  (1.88 m)     Head Circumference --      Peak Flow --      Pain Score 05/11/20 0905 7     Pain Loc --      Pain Edu? --      Excl. in [place]? --     Constitutional: Alert and oriented. Well appearing and in no acute distress. Eyes: Conjunctivae are normal. EOMI. Head: Atraumatic. Nose: No congestion/rhinnorhea. Mouth/Throat: Mucous membranes are moist.   Neck: No stridor. Trachea Midline. FROM Cardiovascular:  Normal rate, regular rhythm. Grossly normal heart sounds.  Good peripheral circulation.  No chest wall  tenderness Respiratory: Normal respiratory effort.  No retractions. Lungs CTAB. Gastrointestinal: Soft and nontender. No distention. No abdominal bruits.  Musculoskeletal: No lower extremity tenderness nor edema.  No joint effusions. Neurologic:  Normal speech and language.  Patient reports increased weakness on the left side with some tingling sensation in the arm and leg. Skin:  Skin is warm, dry and intact. No rash noted. Psychiatric: Mood and affect are normal. Speech and behavior are normal. GU: Deferred  Back.  Point tenderness on the C-spine, T-spine no L-spine tenderness ____________________________________________   LABS (all labs ordered are listed, but only abnormal results are displayed)  Labs Reviewed  CBC WITH DIFFERENTIAL/PLATELET - Abnormal; Notable for the following components:      Result Value   RBC 3.91 (*)    Hemoglobin 11.1 (*)    HCT 34.9 (*)    RDW 16.0 (*)    Neutro Abs 7.9 (*)    All other components within normal limits  COMPREHENSIVE METABOLIC PANEL - Abnormal; Notable for the following components:   CO2 20 (*)    Glucose, Bld 161 (*)    BUN 39 (*)    Creatinine, Ser 5.36 (*)    Calcium 7.6 (*)    Albumin 2.9 (*)    AST 13 (*)    GFR, Estimated 12 (*)    All other components within normal limits  APTT - Abnormal; Notable for the following components:   aPTT 57 (*)    All other components within normal limits  TROPONIN I (HIGH SENSITIVITY) - Abnormal; Notable for the following components:   Troponin I (High Sensitivity) 31 (*)    All other components within normal limits  TROPONIN I (HIGH SENSITIVITY) - Abnormal; Notable for the following components:   Troponin I (High Sensitivity) 29 (*)    All other components within normal limits  PROTIME-INR   ____________________________________________   ED ECG REPORT I, Vanessa Jourdanton, the attending  physician, personally viewed and interpreted this ECG.  Normal sinus rate of 73, no ST elevation but T wave inversion in lead II, aVL, I, V4 through V6.  QTC prolonged at 508 reviewed prior EKGs and he has been in atrial flutter in the past but prior to that he had an EKG that had similar T wave inversions except for lead 2  Repeat EKG is sinus rate of 71, no ST elevation, similar T wave inversions, QTC elevated at 501 ____________________________________________  RADIOLOGY I, Vanessa Taylor, personally viewed and evaluated these images (plain radiographs) as part of my medical decision making, as well as reviewing the written report by the radiologist.  ED MD interpretation: Chest x-ray without widened mediastinum  Official radiology report(s): CT Head Wo Contrast  Result Date: 05/11/2020 CLINICAL DATA:  Recent fall EXAM: CT HEAD WITHOUT CONTRAST TECHNIQUE: Contiguous axial images were obtained from the base of the skull through the vertex without intravenous contrast. COMPARISON:  04/29/2020 FINDINGS: Brain: There is no acute intracranial hemorrhage, mass effect, or edema. Gray-white differentiation is preserved. There is no extra-axial fluid collection. Ventricles and sulci are within normal limits in size and configuration. Vascular: No hyperdense vessel. Minimal atherosclerotic calcification at the skull base. Skull: Calvarium is unremarkable. Sinuses/Orbits: No acute finding. Other: None. IMPRESSION: No evidence of acute intracranial injury. Electronically Signed   By: Macy Mis M.D.   On: 05/11/2020 11:24   CT Cervical Spine Wo Contrast  Result Date: 05/11/2020 CLINICAL DATA:  Recent fall EXAM: CT CERVICAL SPINE WITHOUT CONTRAST TECHNIQUE: Multidetector  CT imaging of the cervical spine was performed without intravenous contrast. Multiplanar CT image reconstructions were also generated. COMPARISON:  None. FINDINGS: There is artifact related to body habitus. Alignment: Anteroposterior  alignment is preserved. Skull base and vertebrae: No acute cervical spine fracture. Vertebral body heights are maintained. Soft tissues and spinal canal: No prevertebral fluid or swelling. The spinal canal is obscured below C2-C3. Disc levels: Intervertebral disc heights are maintained. No degenerative stenosis. Upper chest: Chest dated separately. Other: None. IMPRESSION: No acute cervical spine fracture. Electronically Signed   By: Macy Mis M.D.   On: 05/11/2020 11:22   CT T-SPINE NO CHARGE  Result Date: 05/11/2020 CLINICAL DATA:  Recent fall EXAM: CT THORACIC SPINE WITHOUT CONTRAST TECHNIQUE: Multidetector CT images of the thoracic were obtained using the standard protocol without intravenous contrast. COMPARISON:  None. FINDINGS: Artifact is present secondary to body habitus. Alignment: Preserved. Vertebrae: Vertebral body heights are maintained. There is soft tissue replacing a portion of the posterior T1 vertebral body with erosion of cortex and potential epidural extension. There is no acute fracture identified. Paraspinal and other soft tissues: Extra-spinal findings are better evaluated on concurrent dedicated imaging. Disc levels: Intervertebral disc heights are maintained. No significant osseous encroachment on the spinal canal or neural foramina. IMPRESSION: No acute fracture. Soft tissue replacing part of the posterior T1 vertebral body with erosion of cortex and potential epidural extension. MRI is recommended for further evaluation. Electronically Signed   By: Macy Mis M.D.   On: 05/11/2020 11:39   DG Chest Port 1 View  Result Date: 05/11/2020 CLINICAL DATA:  50 year old male with chest pain while at dialysis. EXAM: PORTABLE CHEST 1 VIEW COMPARISON:  Portable chest 04/29/2020 and earlier. FINDINGS: Portable AP upright view at 0944 hours. Stable left chest AICD, right chest dialysis catheter. Mildly larger, improved lung volumes. Mild cardiomegaly. Other mediastinal contours are  within normal limits. Visualized tracheal air column is within normal limits. No pneumothorax. Allowing for portable technique the lungs are clear. No acute osseous abnormality identified. IMPRESSION: No acute cardiopulmonary abnormality. Electronically Signed   By: Genevie Ann M.D.   On: 05/11/2020 10:02   CT Angio Chest/Abd/Pel for Dissection W and/or Wo Contrast  Result Date: 05/11/2020 CLINICAL DATA:  Chest and abdominal pain. EXAM: CT ANGIOGRAPHY CHEST, ABDOMEN AND PELVIS TECHNIQUE: Non-contrast CT of the chest was initially obtained. Multidetector CT imaging through the chest, abdomen and pelvis was performed using the standard protocol during bolus administration of intravenous contrast. Multiplanar reconstructed images and MIPs were obtained and reviewed to evaluate the vascular anatomy. CONTRAST:  153m OMNIPAQUE IOHEXOL 350 MG/ML SOLN COMPARISON:  July 08, 2018. FINDINGS: CTA CHEST FINDINGS Cardiovascular: Preferential opacification of the thoracic aorta. No evidence of thoracic aortic aneurysm or dissection. Mild cardiomegaly is noted. Coronary artery calcifications are noted. No pericardial effusion. Mediastinum/Nodes: No enlarged mediastinal, hilar, or axillary lymph nodes. Thyroid gland, trachea, and esophagus demonstrate no significant findings. Lungs/Pleura: Lungs are clear. No pleural effusion or pneumothorax. Musculoskeletal: No chest wall abnormality. No acute or significant osseous findings. Review of the MIP images confirms the above findings. CTA ABDOMEN AND PELVIS FINDINGS VASCULAR Aorta: Atherosclerosis of abdominal aorta is noted without aneurysm or dissection. Celiac: Patent without evidence of aneurysm, dissection, vasculitis or significant stenosis. SMA: Patent without evidence of aneurysm, dissection, vasculitis or significant stenosis. Renals: 2 renal arteries are noted on each side, and there is no evidence of significant stenosis or thrombosis present. IMA: Patent without evidence of  aneurysm, dissection, vasculitis or  significant stenosis. Inflow: Patent without evidence of aneurysm, dissection, vasculitis or significant stenosis. Veins: No obvious venous abnormality within the limitations of this arterial phase study. Review of the MIP images confirms the above findings. NON-VASCULAR Hepatobiliary: No focal liver abnormality is seen. No gallstones, gallbladder wall thickening, or biliary dilatation. Pancreas: Unremarkable. No pancreatic ductal dilatation or surrounding inflammatory changes. Spleen: Normal in size without focal abnormality. Adrenals/Urinary Tract: Stable right adrenal myelolipoma. Left adrenal gland is unremarkable. Bilateral renal atrophy is noted. Stable left renal cyst is noted. No hydronephrosis or renal obstruction is noted. No renal or ureteral calculi are noted. Urinary bladder is unremarkable. Stomach/Bowel: Stomach is within normal limits. Appendix appears normal. No evidence of bowel wall thickening, distention, or inflammatory changes. Lymphatic: No significant adenopathy is noted. Reproductive: Prostate is unremarkable. Other: No abdominal wall hernia or abnormality. No abdominopelvic ascites. Musculoskeletal: No acute or significant osseous findings. Review of the MIP images confirms the above findings. IMPRESSION: 1. No evidence of thoracic or abdominal aortic aneurysm or dissection. 2. Coronary artery calcifications are noted suggesting coronary artery disease. 3. Bilateral renal atrophy is noted. 4. Stable right adrenal myelolipoma. 5. No acute abnormality seen in the chest, abdomen or pelvis. 6. Aortic atherosclerosis. Aortic Atherosclerosis (ICD10-I70.0). Electronically Signed   By: Marijo Conception M.D.   On: 05/11/2020 11:28    ____________________________________________   PROCEDURES  Procedure(s) performed (including Critical Care):  .Critical Care Performed by: Vanessa Elwood, MD Authorized by: Vanessa Gales Ferry, MD   Critical care provider  statement:    Critical care time (minutes):  45   Critical care was necessary to treat or prevent imminent or life-threatening deterioration of the following conditions: Possible cord compression.   Critical care was time spent personally by me on the following activities:  Discussions with consultants, evaluation of patient's response to treatment, examination of patient, ordering and performing treatments and interventions, ordering and review of laboratory studies, ordering and review of radiographic studies, pulse oximetry, re-evaluation of patient's condition, obtaining history from patient or surrogate and review of old charts .1-3 Lead EKG Interpretation Performed by: Vanessa Ucon, MD Authorized by: Vanessa [place]rville, MD     Interpretation: normal     ECG rate:  70s    ECG rate assessment: normal     Rhythm: sinus rhythm     Ectopy: none     Conduction: normal       ____________________________________________   INITIAL IMPRESSION / ASSESSMENT AND PLAN / ED COURSE   Edwin [name] was evaluated in Emergency Department on 05/11/2020 for the symptoms described in the history of present illness. He was evaluated in the context of the global COVID-19 pandemic, which necessitated consideration that the patient might be at risk for infection with the SARS-CoV-2 virus that causes COVID-19. Institutional protocols and algorithms that pertain to the evaluation of patients at risk for COVID-19 are in a state of rapid change based on information released by regulatory bodies including the CDC and federal and state organizations. These policies and algorithms were followed during the patient's care in the ED.    Most Likely DDx:  -MSK (atypical chest pain) but will get cardiac markers to evaluate for ACS given risk factors/age -Given patient is now reporting some left-sided tingling sensation consider dissection, cervical injury from the recent fall, stroke  Patient given some Dilaudid to help  with pain  We will keep patient in the cardiac monitor  DDx that was also considered d/t potential to  cause harm, but was found less likely based on history and physical (as detailed above): -PNA (no fevers, cough but CXR to evaluate) -PNX (reassured with equal b/l breath sounds, CXR to evaluate) -Symptomatic anemia (will get H&H) -Pulmonary embolism as no sob at rest, not pleuritic in nature, no hypoxia, on blood thinner -Pericarditis no rub on exam, EKG changes or hx to suggest dx -Tamponade (no notable SOB, tachycardic, hypotensive) -Esophageal rupture (no h/o diffuse vomitting/no crepitus)   When I went back to reevaluated patient and update him on the x-rays that were done at the outside ER he started to tell me that he has had left-sided weakness that caused him to fall 2 days ago and that the weakness is not getting better.  He is stating that he has had numbness and tingling in the left side of his body as well.  While talking with him patient started crying and then proceeded to state that he had severe chest pain again.  Repeat vital signs were stable and repeat EKG was unchanged.  Patient is hypertensive with severe chest pain.  Consider the possibility of dissection.  Chest x-ray was ordered to evaluate for widened mediastinum.  Patient has been on Eliquis for some time now  Labs are reassuring.  Initial cardiac markers 31 which is around his baseline  CT head negative  CT cervical negative  CT chest abdomen pelvis for dissection rule out was negative does have coronary artery calcifications suggesting coronary artery disease.  Patient does have an MRI that shows T1 vertebral body with erosion of cortex and potential epidural extension recommending MRI  I did get a phone call from his cardiologist Dr. Denman George at [facility].  He had a stent placed a few weeks ago and given his cardiac markers were reassuring he thought that this is unlikely to need further cardiac work-up.  However he is  hesitant to stop any antiplatelet agents if that is necessary and some further discussion will be needed  Patient continues to have left-sided tingling sensation and weakness in his left arm.  He still able to ambulate.  At this point I do think he needs the MRI.  I discussed with our MRI techs and it looks like his device is MRI compatible but she could not figure out the leads were.  Patient states that he has had MRIs in the past.  Patient gets all of his care over at [facility].  Given we cant get MRI here family prefers that we transfer him to [facility] to get these MRIs to rule out stroke or cord compression.   No bed available at [facility]  D/w pt wants to go to [facility] next.   D/w Dr. Lacinda Axon does need MRI for sure. Will talk to [facility] transfer  Discussed with [facility] and no bed available. Confirmed with our MRI who again states that we cant not MRI here and pt would need to go to [facility].   Reevaluated patient continues to have left arm weakness and numbness and tingling in left leg numbness and tingling. Discussed with patient as well as patient's wife that patient will need MRI and unfortunately both [facility] and [facility] declined him for bed. They are okay now going to [facility] for ED ED transfer to have this further evaluated. I feel like patient needs both an MRI brain, cervical and T-spine to further evaluate this  Pt handed off to Dr. 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   Vanessa Monowi, MD 05/11/20 1623

## 2020-05-11 NOTE — ED Notes (Signed)
Called [facility] for transfer spoke to [facility]

## 2020-05-11 NOTE — ED Provider Notes (Addendum)
[facility] EMERGENCY DEPARTMENT Provider Note   CSN: [id number] Arrival date & time: 05/11/20  1812     History No chief complaint on file.   Edwin [name] is a 50 y.o. male.  HPI    Patient is a 50 year old male, he comes from [facility] for the need of MRI regarding a possible thoracic spinal cord mass.  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  He went to the outside hospital and was found to have an abnormal CT scan finding recommending MRI.  He has an implantable pacemaker thus he was sent to this hospital for further evaluation for possible MRI compatible machine.  The patient has no other complaints, he states the back pain has been progressive, constant, seems to be worse with certain positions but is there all the time.  He has no urinary symptoms  Past Medical History:  Diagnosis Date  . CAD (coronary artery disease)   . Chronic systolic heart failure ([place])   . Diabetes mellitus without complication ([place])   . Heart attack ([place])   . Hyperlipidemia   . Hypertension   . Ischemic cardiomyopathy   . Paroxysmal atrial fibrillation ([name])   . Renal disorder     Patient Active Problem List   Diagnosis Date Noted  . Mass of thoracic structure 05/11/2020  . COVID-19 virus infection 04/29/2020  . Recurrent episodes of unresponsiveness 04/23/2020  . Dilated cardiomyopathy ([place]) 04/23/2020  . OSA (obstructive sleep apnea) 04/23/2020  . Syncope 04/12/2020  . Elevated troponin 04/12/2020  . Atrial fibrillation, chronic ([place]) 04/12/2020  . GERD (gastroesophageal reflux disease) 04/12/2020  . Gout 04/12/2020  . Depression 04/12/2020  . Obesity, Class III, BMI 40-49.9 (morbid obesity) ([place])   . Chronic systolic CHF (congestive heart failure)  ([place])   . ESRD (end stage renal disease) ([place]) 01/23/2020  . CAD (coronary artery disease) 01/23/2020  . Type 2 diabetes mellitus with ESRD (end-stage renal disease) ([place]) 01/23/2020  . Essential hypertension 01/23/2020  . Hyperlipidemia 01/23/2020  . Atrial fibrillation with rapid ventricular response ([place]) 01/23/2020  . Syncope and collapse 10/28/2018  . Chest pain 07/08/2018  . NSTEMI (non-ST elevated myocardial infarction) ([place]) 03/08/2018  . Infection of left knee ([place]) 12/16/2017    Past Surgical History:  Procedure Laterality Date  . [employer] pacemaker    . CARDIAC SURGERY    . IRRIGATION AND DEBRIDEMENT KNEE Left 12/17/2017   Procedure: LEFT KNEE DEBRIDEMENT AND SYNOVECTOMY;  Surgeon: Thornton Park, MD;  Location: [facility] ORS;  Service: Orthopedics;  Laterality: Left;  . JOINT REPLACEMENT    . LEFT HEART CATH AND CORONARY ANGIOGRAPHY Left 03/09/2018   Procedure: LEFT HEART CATH AND CORONARY ANGIOGRAPHY;  Surgeon: Nelva Bush, MD;  Location: [facility];  Service: Cardiovascular;  Laterality: Left;  . LEFT HEART CATH AND CORONARY ANGIOGRAPHY N/A 01/25/2020   Procedure: LEFT HEART CATH AND CORONARY ANGIOGRAPHY;  Surgeon: Isaias Cowman, MD;  Location: [facility];  Service: Cardiovascular;  Laterality: N/A;  . PERCUTANEOUS CORONARY STENT INTERVENTION (PCI-S)         Family History  Problem Relation Age of Onset  . Diabetes Mother   . Peripheral vascular disease Mother   . COPD Father  Social History   Tobacco Use  . 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 Smokeless tobacco: Current User    Types: Snuff  Vaping Use  . Vaping Use: Never used  Substance Use Topics  . Alcohol use: Never  . Drug use: Never    Home Medications Prior to Admission medications   Medication Sig Start Date End Date Taking? Authorizing Provider  acetaminophen (TYLENOL) 500 MG tablet Take 500-1,000 mg by mouth every 6 (six) hours as needed for mild pain,  moderate pain or fever.   Yes [provider]  albuterol (PROVENTIL HFA;VENTOLIN HFA) 108 (90 Base) MCG/ACT inhaler Inhale 2 puffs into the lungs every 6 (six) hours as needed for wheezing or shortness of breath. 04/29/18  Yes Schuyler Amor, MD  Alirocumab 150 MG/ML SOAJ Inject 150 mg as directed every 14 (fourteen) days.    [provider]  allopurinol (ZYLOPRIM) 100 MG tablet Take 200 mg by mouth daily. 03/18/18   [provider]  amiodarone (PACERONE) 200 MG tablet Take 1 tablet (200 mg total) by mouth 2 (two) times daily. 04/13/20   Shawna Clamp, MD  amLODipine (NORVASC) 10 MG tablet Take 10 mg by mouth daily. 07/26/19   [provider]  apixaban (ELIQUIS) 5 MG TABS tablet Take 5 mg by mouth 2 (two) times daily.    [provider]  atorvastatin (LIPITOR) 80 MG tablet Take 80 mg by mouth every evening.  09/14/17   [provider]  bumetanide (BUMEX) 2 MG tablet Take 2 mg by mouth 2 (two) times daily. 12/06/19   [provider]  cholecalciferol (VITAMIN D) 1000 units tablet Take 1,000 Units by mouth daily.    [provider]  empagliflozin (JARDIANCE) 10 MG TABS tablet Take 10 mg by mouth daily. 03/12/20   [provider]  ezetimibe (ZETIA) 10 MG tablet Take 10 mg by mouth daily. 10/14/17   [provider]  FLUoxetine (PROZAC) 40 MG capsule Take 80 mg by mouth daily. 12/04/17   [provider]  gabapentin (NEURONTIN) 100 MG capsule Take 1 capsule (100 mg total) by mouth 2 (two) times daily. 04/25/20   Aline August, MD  hydrOXYzine (ATARAX/VISTARIL) 10 MG tablet Take 10 mg by mouth daily. 07/26/19   [provider]  insulin aspart (NOVOLOG FLEXPEN) 100 UNIT/ML FlexPen Inject 22 Units into the skin in the morning. 07/26/19   [provider]  insulin detemir (LEVEMIR) 100 UNIT/ML injection Inject 0.1 mLs (10 Units total) into the skin at bedtime. 04/25/20   Aline August, MD  isosorbide  mononitrate (IMDUR) 30 MG 24 hr tablet Take 2 tablets (60 mg total) by mouth daily. Do not take on dialysis days 04/25/20   Aline August, MD  liraglutide (VICTOZA) 18 MG/3ML SOPN Inject 0.6 mg into the skin once a week. 07/26/19   [provider]  loperamide (IMODIUM) 2 MG capsule Take 2 mg by mouth as needed for diarrhea or loose stools.    [provider]  metoprolol succinate (TOPROL-XL) 100 MG 24 hr tablet Take 100 mg by mouth daily. 01/03/20   [provider]  mirtazapine (REMERON) 7.5 MG tablet Take 7.5 mg by mouth at bedtime.    [provider]  Multiple Vitamin (MULTIVITAMIN WITH MINERALS) TABS tablet Take 1 tablet by mouth daily. 10/31/18   Vaughan Basta, MD  nitroGLYCERIN (NITROLINGUAL) 0.4 MG/SPRAY spray Place 1 spray under the tongue every 5 (five) minutes x 3 doses as needed for chest pain.  [provider]  omeprazole (PRILOSEC) 40 MG capsule Take 40 mg by mouth 2 (two) times daily. 11/25/19   [provider]  ondansetron (ZOFRAN) 4 MG tablet Take 4 mg by mouth every 12 (twelve) hours as needed for nausea. 01/04/20   [provider]  ranolazine (RANEXA) 500 MG 12 hr tablet Take 500 mg by mouth 2 (two) times daily.    [provider]  RENVELA 800 MG tablet Take 2,400 mg by mouth 3 (three) times daily with meals. 10/14/17   [provider]  sevelamer (RENAGEL) 800 MG tablet Take 800 mg by mouth daily. 03/20/20   [provider]  ticagrelor (BRILINTA) 90 MG TABS tablet Take 90 mg by mouth 2 (two) times daily.    [provider]  traZODone (DESYREL) 50 MG tablet Take 50 mg by mouth daily. 07/26/19   [provider]  VOLTAREN 1 % GEL Apply 2 g topically 3 (three) times daily as needed (left leg pain).  12/04/17   [provider]    Allergies    Losartan, Morphine, and Morphine and related  Review of Systems   Review of Systems  All other systems reviewed and are  negative.   Physical Exam Updated Vital Signs BP (!) 162/103   Pulse 73   Temp 97.6 F (36.4 C)   Resp 16   SpO2 100%   Physical Exam Vitals and nursing note reviewed.  Constitutional:      General: He is not in acute distress.    Appearance: He is well-developed and well-nourished.  HENT:     Head: Normocephalic and atraumatic.     Mouth/Throat:     Mouth: Oropharynx is clear and moist.     Pharynx: No oropharyngeal exudate.  Eyes:     General: No scleral icterus.       Right eye: No discharge.        Left eye: No discharge.     Extraocular Movements: EOM normal.     Conjunctiva/sclera: Conjunctivae normal.     Pupils: Pupils are equal, round, and reactive to light.  Neck:     Thyroid: No thyromegaly.     Vascular: No JVD.  Cardiovascular:     Rate and Rhythm: Normal rate and regular rhythm.     Pulses: Intact distal pulses.     Heart sounds: Normal heart sounds. No murmur heard. No friction rub. No gallop.   Pulmonary:     Effort: Pulmonary effort is normal. No respiratory distress.     Breath sounds: Normal breath sounds. No wheezing or rales.  Abdominal:     General: Bowel sounds are normal. There is no distension.     Palpations: Abdomen is soft. There is no mass.     Tenderness: There is no abdominal tenderness.  Musculoskeletal:        General: No tenderness or edema. Normal range of motion.     Cervical back: Normal range of motion and neck supple.  Lymphadenopathy:     Cervical: No cervical adenopathy.  Skin:    General: Skin is warm and dry.     Findings: No erythema or rash.  Neurological:     Mental Status: He is alert.     Coordination: Coordination normal.     Comments: This patient has slight weakness of his left lower extremity, hip flexors are weak, he cannot dorsiflex the left foot which is chronic.  He can plantar flex bilaterally.  He has slight grip weakness  in the left arm, he has decreased sensation of the entire left leg below the  kneecap.  He has a dense left facial droop, he cannot close his left eyelid.  Psychiatric:        Mood and Affect: Mood and affect normal.        Behavior: Behavior normal.     ED Results / Procedures / Treatments   Labs (all labs ordered are listed, but only abnormal results are displayed) Labs Reviewed  CBC  CREATININE, SERUM  COMPREHENSIVE METABOLIC PANEL  CBC    EKG EKG Interpretation  Date/Time:  Friday May 11 2020 21:47:38 EST Ventricular Rate:  76 PR Interval:  224 QRS Duration: 102 QT Interval:  456 QTC Calculation: 513 R Axis:   14 Text Interpretation: Sinus rhythm with 1st degree A-V block ST & T wave abnormality, consider lateral ischemia Prolonged QT Abnormal ECG since last tracing no significant change Confirmed by Noemi Chapel [phone]) on 05/11/2020 9:51:18 PM   Radiology CT Head Wo Contrast  Result Date: 05/11/2020 CLINICAL DATA:  Recent fall EXAM: CT HEAD WITHOUT CONTRAST TECHNIQUE: Contiguous axial images were obtained from the base of the skull through the vertex without intravenous contrast. COMPARISON:  04/29/2020 FINDINGS: Brain: There is no acute intracranial hemorrhage, mass effect, or edema. Gray-white differentiation is preserved. There is no extra-axial fluid collection. Ventricles and sulci are within normal limits in size and configuration. Vascular: No hyperdense vessel. Minimal atherosclerotic calcification at the skull base. Skull: Calvarium is unremarkable. Sinuses/Orbits: No acute finding. Other: None. IMPRESSION: No evidence of acute intracranial injury. Electronically Signed   By: Macy Mis M.D.   On: 05/11/2020 11:24   CT Cervical Spine Wo Contrast  Result Date: 05/11/2020 CLINICAL DATA:  Recent fall EXAM: CT CERVICAL SPINE WITHOUT CONTRAST TECHNIQUE: Multidetector CT imaging of the cervical spine was performed without intravenous contrast. Multiplanar CT image reconstructions were also generated. COMPARISON:  None. FINDINGS: There is  artifact related to body habitus. Alignment: Anteroposterior alignment is preserved. Skull base and vertebrae: No acute cervical spine fracture. Vertebral body heights are maintained. Soft tissues and spinal canal: No prevertebral fluid or swelling. The spinal canal is obscured below C2-C3. Disc levels: Intervertebral disc heights are maintained. No degenerative stenosis. Upper chest: Chest dated separately. Other: None. IMPRESSION: No acute cervical spine fracture. Electronically Signed   By: Macy Mis M.D.   On: 05/11/2020 11:22   CT T-SPINE NO CHARGE  Result Date: 05/11/2020 CLINICAL DATA:  Recent fall EXAM: CT THORACIC SPINE WITHOUT CONTRAST TECHNIQUE: Multidetector CT images of the thoracic were obtained using the standard protocol without intravenous contrast. COMPARISON:  None. FINDINGS: Artifact is present secondary to body habitus. Alignment: Preserved. Vertebrae: Vertebral body heights are maintained. There is soft tissue replacing a portion of the posterior T1 vertebral body with erosion of cortex and potential epidural extension. There is no acute fracture identified. Paraspinal and other soft tissues: Extra-spinal findings are better evaluated on concurrent dedicated imaging. Disc levels: Intervertebral disc heights are maintained. No significant osseous encroachment on the spinal canal or neural foramina. IMPRESSION: No acute fracture. Soft tissue replacing part of the posterior T1 vertebral body with erosion of cortex and potential epidural extension. MRI is recommended for further evaluation. Electronically Signed   By: Macy Mis M.D.   On: 05/11/2020 11:39   DG Chest Port 1 View  Result Date: 05/11/2020 CLINICAL DATA:  50 year old male with chest pain while at dialysis. EXAM: PORTABLE CHEST 1 VIEW COMPARISON:  Portable chest  04/29/2020 and earlier. FINDINGS: Portable AP upright view at 0944 hours. Stable left chest AICD, right chest dialysis catheter. Mildly larger, improved lung  volumes. Mild cardiomegaly. Other mediastinal contours are within normal limits. Visualized tracheal air column is within normal limits. No pneumothorax. Allowing for portable technique the lungs are clear. No acute osseous abnormality identified. IMPRESSION: No acute cardiopulmonary abnormality. Electronically Signed   By: Genevie Ann M.D.   On: 05/11/2020 10:02   CT Angio Chest/Abd/Pel for Dissection W and/or Wo Contrast  Result Date: 05/11/2020 CLINICAL DATA:  Chest and abdominal pain. EXAM: CT ANGIOGRAPHY CHEST, ABDOMEN AND PELVIS TECHNIQUE: Non-contrast CT of the chest was initially obtained. Multidetector CT imaging through the chest, abdomen and pelvis was performed using the standard protocol during bolus administration of intravenous contrast. Multiplanar reconstructed images and MIPs were obtained and reviewed to evaluate the vascular anatomy. CONTRAST:  172m OMNIPAQUE IOHEXOL 350 MG/ML SOLN COMPARISON:  July 08, 2018. FINDINGS: CTA CHEST FINDINGS Cardiovascular: Preferential opacification of the thoracic aorta. No evidence of thoracic aortic aneurysm or dissection. Mild cardiomegaly is noted. Coronary artery calcifications are noted. No pericardial effusion. Mediastinum/Nodes: No enlarged mediastinal, hilar, or axillary lymph nodes. Thyroid gland, trachea, and esophagus demonstrate no significant findings. Lungs/Pleura: Lungs are clear. No pleural effusion or pneumothorax. Musculoskeletal: No chest wall abnormality. No acute or significant osseous findings. Review of the MIP images confirms the above findings. CTA ABDOMEN AND PELVIS FINDINGS VASCULAR Aorta: Atherosclerosis of abdominal aorta is noted without aneurysm or dissection. Celiac: Patent without evidence of aneurysm, dissection, vasculitis or significant stenosis. SMA: Patent without evidence of aneurysm, dissection, vasculitis or significant stenosis. Renals: 2 renal arteries are noted on each side, and there is no evidence of significant  stenosis or thrombosis present. IMA: Patent without evidence of aneurysm, dissection, vasculitis or significant stenosis. Inflow: Patent without evidence of aneurysm, dissection, vasculitis or significant stenosis. Veins: No obvious venous abnormality within the limitations of this arterial phase study. Review of the MIP images confirms the above findings. NON-VASCULAR Hepatobiliary: No focal liver abnormality is seen. No gallstones, gallbladder wall thickening, or biliary dilatation. Pancreas: Unremarkable. No pancreatic ductal dilatation or surrounding inflammatory changes. Spleen: Normal in size without focal abnormality. Adrenals/Urinary Tract: Stable right adrenal myelolipoma. Left adrenal gland is unremarkable. Bilateral renal atrophy is noted. Stable left renal cyst is noted. No hydronephrosis or renal obstruction is noted. No renal or ureteral calculi are noted. Urinary bladder is unremarkable. Stomach/Bowel: Stomach is within normal limits. Appendix appears normal. No evidence of bowel wall thickening, distention, or inflammatory changes. Lymphatic: No significant adenopathy is noted. Reproductive: Prostate is unremarkable. Other: No abdominal wall hernia or abnormality. No abdominopelvic ascites. Musculoskeletal: No acute or significant osseous findings. Review of the MIP images confirms the above findings. IMPRESSION: 1. No evidence of thoracic or abdominal aortic aneurysm or dissection. 2. Coronary artery calcifications are noted suggesting coronary artery disease. 3. Bilateral renal atrophy is noted. 4. Stable right adrenal myelolipoma. 5. No acute abnormality seen in the chest, abdomen or pelvis. 6. Aortic atherosclerosis. Aortic Atherosclerosis (ICD10-I70.0). Electronically Signed   By: JMarijo ConceptionM.D.   On: 05/11/2020 11:28    Procedures Procedures   Medications Ordered in ED Medications  acetaminophen (TYLENOL) tablet 650 mg (650 mg Oral Given 05/11/20 2111)    Or  acetaminophen  (TYLENOL) suppository 650 mg ( Rectal See Alternative 05/11/20 2111)  zolpidem (AMBIEN) tablet 5 mg (has no administration in time range)  camphor-menthol (SARNA) lotion 1 application (has no administration in  time range)    And  hydrOXYzine (ATARAX/VISTARIL) tablet 25 mg (has no administration in time range)  sorbitol 70 % solution 30 mL (has no administration in time range)  docusate sodium (ENEMEEZ) enema 283 mg (has no administration in time range)  ondansetron (ZOFRAN) tablet 4 mg (has no administration in time range)    Or  ondansetron (ZOFRAN) injection 4 mg (has no administration in time range)  calcium carbonate (dosed in mg elemental calcium) suspension 500 mg of elemental calcium (has no administration in time range)  feeding supplement (NEPRO CARB STEADY) liquid 237 mL (has no administration in time range)  heparin injection 5,000 Units (has no administration in time range)  insulin aspart (novoLOG) injection 0-6 Units (has no administration in time range)  insulin aspart (novoLOG) injection 0-5 Units (has no administration in time range)  morphine 2 MG/ML injection 2 mg (has no administration in time range)    ED Course  I have reviewed the triage vital signs and the nursing notes.  Pertinent labs & imaging results that were available during my care of the patient were reviewed by me and considered in my medical decision making (see chart for details).    MDM Rules/Calculators/A&P                          I have reviewed the patient's prior chart, the imaging was performed at the outside facility and I will consult with both neurosurgery and neurology.  The patient does appear to have a left facial droop, it is unclear exactly when this started, the patient was unaware that he had it.  Unsure of onset.    It is documented that the patient has had some left leg and arm weakness for several days dating to sometime around his fall on Wednesday.  The patient was unaware of any  left facial droop.  I discussed with Dr. Cheral Marker at 6:50 PM, he will come see the patient.  Pt seen and examined - Neuro recommends MRI as well, not code stroke, they are aware of this pt who they saw recently at OSH.    Per chart review most recent ICD  Implanted Devices and Leads: Implants  ICD  Sys Vigilant [address] Isa Rankin 1ld - [id number] - Implanted Chest  Inventory item: Sys Vigilant [address] Isa Rankin 1ld Model/Cat number: [id number]  Serial number: [id number] Manufacturer: [employer] MGMT  As of 05/04/2017  Status: Implanted    Implant  [employer] Mri [id number] - Implanted   Model/Cat number: [id number] MRI [id number] Serial number: [id number]  Manufacturer: [place]   As of 05/28/2016  Status: Implanted    [employer] In[employer] Mri [id number] - Implanted   Model/Cat number: [id number] MRI [id number] Serial number: [id number]  Manufacturer: [place]   As of 05/28/2016  Status: Implanted    Icd Lead - Implanted Heart  Model/Cat number: [id number] Serial number: [id number]  Manufacturer: [place]   As of 05/04/2017  Status: Implanted   D/w Dr. Jonelle Sidle who will admit - hopeful for MRI tomorrow  Final Clinical Impression(s) / ED Diagnoses Final diagnoses:  Weakness of left lower extremity  Chronic midline thoracic back pain      Noemi Chapel, MD 05/11/20 2034    Noemi Chapel, MD 05/11/20 2151

## 2020-05-11 NOTE — ED Notes (Signed)
Report to [place] at [facility]

## 2020-05-11 NOTE — ED Notes (Signed)
Pt presents to ED with c/o of having chest pain while at dialysis. Pt states dialysis schedule is M,W, F and states he also this past missed dialysis this past Wed due to back pain from a fall a few das ago. Pt states he was given 324 ASA from dialysis staff as well as 2 nitro. Pt is A&Ox4.

## 2020-05-12 DIAGNOSIS — N186 End stage renal disease: Secondary | ICD-10-CM | POA: Diagnosis present

## 2020-05-12 DIAGNOSIS — R222 Localized swelling, mass and lump, trunk: Secondary | ICD-10-CM | POA: Diagnosis not present

## 2020-05-12 DIAGNOSIS — F419 Anxiety disorder, unspecified: Secondary | ICD-10-CM | POA: Diagnosis present

## 2020-05-12 DIAGNOSIS — R2990 Unspecified symptoms and signs involving the nervous system: Secondary | ICD-10-CM | POA: Diagnosis not present

## 2020-05-12 DIAGNOSIS — G8194 Hemiplegia, unspecified affecting left nondominant side: Secondary | ICD-10-CM | POA: Diagnosis present

## 2020-05-12 DIAGNOSIS — I5022 Chronic systolic (congestive) heart failure: Secondary | ICD-10-CM

## 2020-05-12 DIAGNOSIS — Z6841 Body Mass Index (BMI) 40.0 and over, adult: Secondary | ICD-10-CM | POA: Diagnosis not present

## 2020-05-12 DIAGNOSIS — I482 Chronic atrial fibrillation, unspecified: Secondary | ICD-10-CM | POA: Diagnosis present

## 2020-05-12 DIAGNOSIS — I2510 Atherosclerotic heart disease of native coronary artery without angina pectoris: Secondary | ICD-10-CM | POA: Diagnosis not present

## 2020-05-12 DIAGNOSIS — U071 COVID-19: Secondary | ICD-10-CM | POA: Diagnosis present

## 2020-05-12 DIAGNOSIS — M21372 Foot drop, left foot: Secondary | ICD-10-CM | POA: Diagnosis present

## 2020-05-12 DIAGNOSIS — I132 Hypertensive heart and chronic kidney disease with heart failure and with stage 5 chronic kidney disease, or end stage renal disease: Secondary | ICD-10-CM | POA: Diagnosis present

## 2020-05-12 DIAGNOSIS — R29898 Other symptoms and signs involving the musculoskeletal system: Secondary | ICD-10-CM | POA: Diagnosis present

## 2020-05-12 DIAGNOSIS — E1142 Type 2 diabetes mellitus with diabetic polyneuropathy: Secondary | ICD-10-CM | POA: Diagnosis present

## 2020-05-12 DIAGNOSIS — K219 Gastro-esophageal reflux disease without esophagitis: Secondary | ICD-10-CM | POA: Diagnosis present

## 2020-05-12 DIAGNOSIS — G510 Bell's palsy: Secondary | ICD-10-CM | POA: Diagnosis present

## 2020-05-12 DIAGNOSIS — I255 Ischemic cardiomyopathy: Secondary | ICD-10-CM | POA: Diagnosis present

## 2020-05-12 DIAGNOSIS — R404 Transient alteration of awareness: Secondary | ICD-10-CM

## 2020-05-12 DIAGNOSIS — W19XXXA Unspecified fall, initial encounter: Secondary | ICD-10-CM | POA: Diagnosis present

## 2020-05-12 DIAGNOSIS — E1122 Type 2 diabetes mellitus with diabetic chronic kidney disease: Secondary | ICD-10-CM | POA: Diagnosis present

## 2020-05-12 DIAGNOSIS — E785 Hyperlipidemia, unspecified: Secondary | ICD-10-CM | POA: Diagnosis present

## 2020-05-12 DIAGNOSIS — I420 Dilated cardiomyopathy: Secondary | ICD-10-CM | POA: Diagnosis present

## 2020-05-12 DIAGNOSIS — I252 Old myocardial infarction: Secondary | ICD-10-CM | POA: Diagnosis not present

## 2020-05-12 DIAGNOSIS — M109 Gout, unspecified: Secondary | ICD-10-CM | POA: Diagnosis present

## 2020-05-12 DIAGNOSIS — F32A Depression, unspecified: Secondary | ICD-10-CM | POA: Diagnosis present

## 2020-05-12 DIAGNOSIS — G4733 Obstructive sleep apnea (adult) (pediatric): Secondary | ICD-10-CM | POA: Diagnosis present

## 2020-05-12 DIAGNOSIS — I480 Paroxysmal atrial fibrillation: Secondary | ICD-10-CM | POA: Diagnosis present

## 2020-05-12 DIAGNOSIS — I5042 Chronic combined systolic (congestive) and diastolic (congestive) heart failure: Secondary | ICD-10-CM | POA: Diagnosis present

## 2020-05-12 DIAGNOSIS — E1165 Type 2 diabetes mellitus with hyperglycemia: Secondary | ICD-10-CM | POA: Diagnosis present

## 2020-05-12 LAB — CBC WITH DIFFERENTIAL/PLATELET
Abs Immature Granulocytes: 0.04 10*3/uL (ref 0.00–0.07)
Basophils Absolute: 0 10*3/uL (ref 0.0–0.1)
Basophils Relative: 0 %
Eosinophils Absolute: 0.1 10*3/uL (ref 0.0–0.5)
Eosinophils Relative: 1 %
HCT: 35.8 % — ABNORMAL LOW (ref 39.0–52.0)
Hemoglobin: 10.8 g/dL — ABNORMAL LOW (ref 13.0–17.0)
Immature Granulocytes: 0 %
Lymphocytes Relative: 11 %
Lymphs Abs: 1 10*3/uL (ref 0.7–4.0)
MCH: 27.8 pg (ref 26.0–34.0)
MCHC: 30.2 g/dL (ref 30.0–36.0)
MCV: 92 fL (ref 80.0–100.0)
Monocytes Absolute: 0.7 10*3/uL (ref 0.1–1.0)
Monocytes Relative: 7 %
Neutro Abs: 7.3 10*3/uL (ref 1.7–7.7)
Neutrophils Relative %: 81 %
Platelets: 217 10*3/uL (ref 150–400)
RBC: 3.89 MIL/uL — ABNORMAL LOW (ref 4.22–5.81)
RDW: 16 % — ABNORMAL HIGH (ref 11.5–15.5)
WBC: 9.1 10*3/uL (ref 4.0–10.5)
nRBC: 0 % (ref 0.0–0.2)

## 2020-05-12 LAB — COMPREHENSIVE METABOLIC PANEL
ALT: 11 U/L (ref 0–44)
AST: 11 U/L — ABNORMAL LOW (ref 15–41)
Albumin: 2.8 g/dL — ABNORMAL LOW (ref 3.5–5.0)
Alkaline Phosphatase: 89 U/L (ref 38–126)
Anion gap: 10 (ref 5–15)
BUN: 38 mg/dL — ABNORMAL HIGH (ref 6–20)
CO2: 21 mmol/L — ABNORMAL LOW (ref 22–32)
Calcium: 7.7 mg/dL — ABNORMAL LOW (ref 8.9–10.3)
Chloride: 106 mmol/L (ref 98–111)
Creatinine, Ser: 6.27 mg/dL — ABNORMAL HIGH (ref 0.61–1.24)
GFR, Estimated: 10 mL/min — ABNORMAL LOW (ref >60)
Glucose, Bld: 160 mg/dL — ABNORMAL HIGH (ref 70–99)
Potassium: 5.1 mmol/L (ref 3.5–5.1)
Sodium: 137 mmol/L (ref 135–145)
Total Bilirubin: 0.9 mg/dL (ref 0.3–1.2)
Total Protein: 6.6 g/dL (ref 6.5–8.1)

## 2020-05-12 LAB — CBC
HCT: 37.9 % — ABNORMAL LOW (ref 39.0–52.0)
Hemoglobin: 11.4 g/dL — ABNORMAL LOW (ref 13.0–17.0)
MCH: 27.7 pg (ref 26.0–34.0)
MCHC: 30.1 g/dL (ref 30.0–36.0)
MCV: 92.2 fL (ref 80.0–100.0)
Platelets: 218 10*3/uL (ref 150–400)
RBC: 4.11 MIL/uL — ABNORMAL LOW (ref 4.22–5.81)
RDW: 16.1 % — ABNORMAL HIGH (ref 11.5–15.5)
WBC: 9.7 10*3/uL (ref 4.0–10.5)
nRBC: 0 % (ref 0.0–0.2)

## 2020-05-12 LAB — CREATININE, SERUM
Creatinine, Ser: 6.17 mg/dL — ABNORMAL HIGH (ref 0.61–1.24)
GFR, Estimated: 10 mL/min — ABNORMAL LOW (ref >60)

## 2020-05-12 LAB — POCT I-STAT 7, (LYTES, BLD GAS, ICA,H+H)
Acid-base deficit: 6 mmol/L — ABNORMAL HIGH (ref 0.0–2.0)
Bicarbonate: 21.4 mmol/L (ref 20.0–28.0)
Calcium, Ion: 1.11 mmol/L — ABNORMAL LOW (ref 1.15–1.40)
HCT: 34 % — ABNORMAL LOW (ref 39.0–52.0)
Hemoglobin: 11.6 g/dL — ABNORMAL LOW (ref 13.0–17.0)
O2 Saturation: 99 %
Patient temperature: 98.6
Potassium: 5 mmol/L (ref 3.5–5.1)
Sodium: 137 mmol/L (ref 135–145)
TCO2: 23 mmol/L (ref 22–32)
pCO2 arterial: 47.4 mmHg (ref 32.0–48.0)
pH, Arterial: 7.262 — ABNORMAL LOW (ref 7.350–7.450)
pO2, Arterial: 186 mmHg — ABNORMAL HIGH (ref 83.0–108.0)

## 2020-05-12 LAB — GLUCOSE, CAPILLARY
Glucose-Capillary: 131 mg/dL — ABNORMAL HIGH (ref 70–99)
Glucose-Capillary: 210 mg/dL — ABNORMAL HIGH (ref 70–99)
Glucose-Capillary: 164 mg/dL — ABNORMAL HIGH (ref 70–99)
Glucose-Capillary: 127 mg/dL — ABNORMAL HIGH (ref 70–99)

## 2020-05-12 LAB — PHOSPHORUS: Phosphorus: 5.8 mg/dL — ABNORMAL HIGH (ref 2.5–4.6)

## 2020-05-12 LAB — TSH: TSH: 5.446 u[IU]/mL — ABNORMAL HIGH (ref 0.350–4.500)

## 2020-05-12 LAB — T4, FREE: Free T4: 0.8 ng/dL (ref 0.61–1.12)

## 2020-05-12 LAB — TROPONIN I (HIGH SENSITIVITY)
Troponin I (High Sensitivity): 28 ng/L — ABNORMAL HIGH (ref <18)
Troponin I (High Sensitivity): 29 ng/L — ABNORMAL HIGH (ref <18)

## 2020-05-12 LAB — LACTIC ACID, PLASMA
Lactic Acid, Venous: 0.9 mmol/L (ref 0.5–1.9)
Lactic Acid, Venous: 1 mmol/L (ref 0.5–1.9)

## 2020-05-12 LAB — MAGNESIUM: Magnesium: 1.8 mg/dL (ref 1.7–2.4)

## 2020-05-12 MED ORDER — MORPHINE SULFATE (PF) 2 MG/ML IV SOLN
2.0000 mg | INTRAVENOUS | Status: DC | PRN
  Filled 2020-05-12: qty 1

## 2020-05-12 MED ORDER — AMLODIPINE BESYLATE 10 MG PO TABS
10.0000 mg | ORAL_TABLET | Freq: Every day | ORAL | Status: DC
  Administered 2020-05-13 – 2020-05-15 (×3): 10 mg via ORAL
  Filled 2020-05-12 (×4): qty 1

## 2020-05-12 MED ORDER — METOPROLOL SUCCINATE ER 100 MG PO TB24
100.0000 mg | ORAL_TABLET | Freq: Every day | ORAL | Status: DC
  Administered 2020-05-12 – 2020-05-15 (×4): 100 mg via ORAL
  Filled 2020-05-12 (×2): qty 1
  Filled 2020-05-12: qty 2
  Filled 2020-05-12: qty 1

## 2020-05-12 MED ORDER — CHLORHEXIDINE GLUCONATE CLOTH 2 % EX PADS
6.0000 | MEDICATED_PAD | Freq: Every day | CUTANEOUS | Status: DC
  Administered 2020-05-12 – 2020-05-14 (×3): 6 via TOPICAL

## 2020-05-12 MED ORDER — SEVELAMER CARBONATE 800 MG PO TABS
1600.0000 mg | ORAL_TABLET | Freq: Three times a day (TID) | ORAL | Status: DC
  Administered 2020-05-12 – 2020-05-15 (×9): 1600 mg via ORAL
  Filled 2020-05-12 (×9): qty 2

## 2020-05-12 MED ORDER — GABAPENTIN 100 MG PO CAPS
100.0000 mg | ORAL_CAPSULE | Freq: Two times a day (BID) | ORAL | Status: DC
  Administered 2020-05-12 – 2020-05-15 (×8): 100 mg via ORAL
  Filled 2020-05-12 (×8): qty 1

## 2020-05-12 MED ORDER — RANOLAZINE ER 500 MG PO TB12
500.0000 mg | ORAL_TABLET | Freq: Two times a day (BID) | ORAL | Status: DC
  Administered 2020-05-12 – 2020-05-15 (×8): 500 mg via ORAL
  Filled 2020-05-12 (×8): qty 1

## 2020-05-12 MED ORDER — FLUOXETINE HCL 20 MG PO CAPS
80.0000 mg | ORAL_CAPSULE | Freq: Every day | ORAL | Status: DC
  Administered 2020-05-12 – 2020-05-15 (×4): 80 mg via ORAL
  Filled 2020-05-12 (×4): qty 4

## 2020-05-12 MED ORDER — OXYCODONE-ACETAMINOPHEN 5-325 MG PO TABS
1.0000 | ORAL_TABLET | Freq: Three times a day (TID) | ORAL | Status: DC | PRN
  Administered 2020-05-12 – 2020-05-14 (×6): 1 via ORAL
  Filled 2020-05-12 (×6): qty 1

## 2020-05-12 MED ORDER — TRAZODONE HCL 50 MG PO TABS
50.0000 mg | ORAL_TABLET | Freq: Every day | ORAL | Status: DC
  Administered 2020-05-12 – 2020-05-13 (×2): 50 mg via ORAL
  Filled 2020-05-12 (×2): qty 1

## 2020-05-12 MED ORDER — PANTOPRAZOLE SODIUM 40 MG PO TBEC
40.0000 mg | DELAYED_RELEASE_TABLET | Freq: Every day | ORAL | Status: DC
  Administered 2020-05-12 – 2020-05-15 (×4): 40 mg via ORAL
  Filled 2020-05-12 (×4): qty 1

## 2020-05-12 MED ORDER — INSULIN DETEMIR 100 UNIT/ML ~~LOC~~ SOLN
10.0000 [IU] | Freq: Every day | SUBCUTANEOUS | Status: DC
  Administered 2020-05-12 – 2020-05-15 (×4): 10 [IU] via SUBCUTANEOUS
  Filled 2020-05-12 (×5): qty 0.1

## 2020-05-12 MED ORDER — TICAGRELOR 90 MG PO TABS
90.0000 mg | ORAL_TABLET | Freq: Two times a day (BID) | ORAL | Status: DC
  Administered 2020-05-12 – 2020-05-15 (×8): 90 mg via ORAL
  Filled 2020-05-12 (×8): qty 1

## 2020-05-12 MED ORDER — ALBUTEROL SULFATE (2.5 MG/3ML) 0.083% IN NEBU: 2.5000 mg | INHALATION_SOLUTION | Freq: Four times a day (QID) | RESPIRATORY_TRACT | Status: DC | PRN

## 2020-05-12 MED ORDER — LABETALOL HCL 5 MG/ML IV SOLN
5.0000 mg | Freq: Four times a day (QID) | INTRAVENOUS | Status: DC | PRN
  Administered 2020-05-12: 5 mg via INTRAVENOUS
  Filled 2020-05-12: qty 4

## 2020-05-12 MED ORDER — APIXABAN 5 MG PO TABS
5.0000 mg | ORAL_TABLET | Freq: Two times a day (BID) | ORAL | Status: DC
  Administered 2020-05-12 – 2020-05-15 (×8): 5 mg via ORAL
  Filled 2020-05-12 (×8): qty 1

## 2020-05-12 MED ORDER — AMLODIPINE BESYLATE 10 MG PO TABS: 10.0000 mg | ORAL_TABLET | Freq: Every day | ORAL | Status: DC

## 2020-05-12 MED ORDER — AMIODARONE HCL 200 MG PO TABS
200.0000 mg | ORAL_TABLET | Freq: Two times a day (BID) | ORAL | Status: DC
  Administered 2020-05-12 – 2020-05-15 (×8): 200 mg via ORAL
  Filled 2020-05-12 (×8): qty 1

## 2020-05-12 MED ORDER — ATORVASTATIN CALCIUM 80 MG PO TABS
80.0000 mg | ORAL_TABLET | Freq: Every evening | ORAL | Status: DC
  Administered 2020-05-12 – 2020-05-15 (×4): 80 mg via ORAL
  Filled 2020-05-12 (×4): qty 1

## 2020-05-12 MED ORDER — CHLORHEXIDINE GLUCONATE CLOTH 2 % EX PADS: 6.0000 | MEDICATED_PAD | Freq: Every day | CUTANEOUS | Status: DC

## 2020-05-12 MED ORDER — ALLOPURINOL 100 MG PO TABS
200.0000 mg | ORAL_TABLET | Freq: Every day | ORAL | Status: DC
  Administered 2020-05-12 – 2020-05-15 (×4): 200 mg via ORAL
  Filled 2020-05-12 (×4): qty 2

## 2020-05-12 MED ORDER — ISOSORBIDE MONONITRATE ER 60 MG PO TB24
60.0000 mg | ORAL_TABLET | ORAL | Status: DC
  Administered 2020-05-12 – 2020-05-15 (×3): 60 mg via ORAL
  Filled 2020-05-12 (×3): qty 1

## 2020-05-12 NOTE — Progress Notes (Signed)
Received pt from [facility], alert and oriented, flat affect. Agree with previous Rn's assessment.

## 2020-05-12 NOTE — Significant Event (Addendum)
Rapid Response Event Note   Reason for Call :  Code Blue called d/t pt unresponsiveness. Once chest compressions started, pt woke up and began to shake/tremore.  On my arrival, pt was alert but sleepy. Code Blue cancelled.   Initial Focused Assessment:  Pt lying in bed with eyes closed. He will awaken to repeated verbal stimulation, answer simple questions, follow simple commands, and move all extremities. He falls back to sleep very easily with no stimulation. He has a L facial droop(this is not new). He is c/o 10/10 chest pain.  His lungs are diminished t/o. Skin is warm and dry.  HR-74, BP-168/95, RR-16, SpO2-100%   Interventions:  ABG-7.26/47.4/186/21.4 Tx to 2H26-during tx pt had an episode of shaking accompanied with decreased LOC.  Plan of Care:  Tx to 2H26   Event Summary:   MD Notified: Dr. Myna Hidalgo notified and came to bedside.  Call [place] [phone]  Dillard Essex, RN

## 2020-05-12 NOTE — Significant Event (Signed)
Cross-coverage note:   Code blue was called when patient was not responding and there was question of ventricular tachycardia on monitor. He responded after 1 chest compression and no ventricular tachycardia was noted review of telemetry.   HPI:   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  He was positive for COVID-19 on 04/23/2020 but never had respiratory symptoms.    He presented to [facility] ED on 05/11/2020 for chest pain while at dialysis, also reported back pain that he attributed to a fall a few days earlier, had work-up with at bedtime troponin 31 and then 29, no acute ischemic findings on EKG, and CTA chest/abd/pelvis negative for aortic aneurysm or dissection.  CT in the ED was notable for soft tissue replacing part of the posterior T1 vertebral body with erosion of the cortex and potential epidural extension.  Patient was transferred to [facility] ED as MRI at [facility] cannot accommodate patient's cardiac device.  Neurosurgery was consulted and patient was admitted to the hospitalist service.  He continued to complain of chest pain on arrival to the floor, had episodes of left-sided shaking and stopped responding, there is concern of possible ventricular tachycardia on the monitor, and so CODE BLUE was called.  Patient woke up after 1 chest compression.  A&P:   1. Seizure-like episodes  - Patient with frequent episodes of closing his eyes and shaking on left, intermittently follows commands and answers questions during episodes  - No acute findings on Head CT in ED, no evidence for infection, and no significantly  electrolyte derangements; CO2 normal on ABG - Patient responds to commands during episodes  - He has had multiple neurology consultations during recent admissions with no episodes captured on continuous EEG  - Appreciate Dr. Rory Percy reviewing case, notes that patient has had extensive recent neurology workups; he does not feel additional neuro workup needed at this time aside from the MRIs that are ordered   - Discussed with cardiology fellow who agrees with device interrogation   2. T1 mass  - Neurosurgery consulting, planning for MRI with and without   Refer to H&P from last night for full A&P.

## 2020-05-12 NOTE — Progress Notes (Signed)
Pt complaining of 10/10 chest pain in mid epigastric region, exhibiting signs of a seizure with jerking movements, and after obtaining EKG pt became unresponsive, questionable V-tach on the monitor. Code Blue initiated. Pt was then transported to [place], pt's spouse was notified.   Elaina Hoops, RN

## 2020-05-12 NOTE — Consult Note (Signed)
Renal Service Consult Note [facility]  Edwin [name] 05/12/2020 Sol Blazing, MD Requesting Physician: Dr Nena Alexander  Reason for Consult: ESRD pt w/ mass on spine HPI: The patient is a 50 y.o. year-old w/ hx of end-stage renal disease on hemodialysis since 2021, hypertension, hyperlipidemia, diabetes, atrial fibrillation, coronary artery disease with angioplasty and stent, AICD, systolic CHF, gout, depression who presented to [facility] ED yest w/ chest pain while on dialysis. He missed Wed dialysis due to a fall and subsequent back pain. Was having also L leg numbness and weakness. CT of chest showed a T1 lesion, possibly a mass/ tumor. Could not get MRI there due to PPM so pt was tx'd to [facility]. CXR was negative, Hb 11, wbc 9K, creat 5.3. BP 140 /60 HR 78  RR 22, 91% on RA. We are asked to see for ESRD.    Pt seen in room on 2H.  C/o L facial droop and L arm and leg weakness +/- numbness.  Usual HD [name], missed HD Wed. On HD for about 1 year. No SOB or cough, no abd pain. Has R chest TDC.  States his "heart was too bad" to get AVF surgery. Has DM on insulin. Minimal swelling of ankles, usually has a bit more fluid in this ankles.       ROS  denies CP  no joint pain   no HA  no blurry vision  no rash  no diarrhea  no nausea/ vomiting  no dysuria  no difficulty voiding  no change in urine color    Past Medical History  Past Medical History:  Diagnosis Date  . CAD (coronary artery disease)   . Chronic systolic heart failure ([place])   . Diabetes mellitus without complication ([place])   . Heart attack ([place])   . Hyperlipidemia   . Hypertension   . Ischemic cardiomyopathy   . Paroxysmal atrial fibrillation ([name])   . Renal disorder    Past Surgical History  Past Surgical History:  Procedure Laterality Date  . [employer] pacemaker    . CARDIAC SURGERY    . IRRIGATION AND DEBRIDEMENT KNEE Left 12/17/2017   Procedure: LEFT KNEE DEBRIDEMENT AND SYNOVECTOMY;   Surgeon: Thornton Park, MD;  Location: [facility] ORS;  Service: Orthopedics;  Laterality: Left;  . JOINT REPLACEMENT    . LEFT HEART CATH AND CORONARY ANGIOGRAPHY Left 03/09/2018   Procedure: LEFT HEART CATH AND CORONARY ANGIOGRAPHY;  Surgeon: Nelva Bush, MD;  Location: [place] CV LAB;  Service: Cardiovascular;  Laterality: Left;  . LEFT HEART CATH AND CORONARY ANGIOGRAPHY N/A 01/25/2020   Procedure: LEFT HEART CATH AND CORONARY ANGIOGRAPHY;  Surgeon: Isaias Cowman, MD;  Location: [place] CV LAB;  Service: Cardiovascular;  Laterality: N/A;  . PERCUTANEOUS CORONARY STENT INTERVENTION (PCI-S)     Family History  Family History  Problem Relation Age of Onset  . Diabetes Mother   . Peripheral vascular disease Mother   . COPD Father    Social History  reports that he has quit smoking. His smokeless tobacco use includes snuff. He reports that he does not drink alcohol and does not use drugs. Allergies  Allergies  Allergen Reactions  . Losartan     Hyperkalemia (K>6)  . Morphine Anxiety  . Morphine And Related Other (See Comments)    Overdose in hospital per patient report   Home medications Prior to Admission medications   Medication Sig Start Date End Date Taking? Authorizing Provider  acetaminophen (TYLENOL) 500  MG tablet Take 500-1,000 mg by mouth every 6 (six) hours as needed for mild pain, moderate pain or fever.   Yes [provider]  albuterol (PROVENTIL HFA;VENTOLIN HFA) 108 (90 Base) MCG/ACT inhaler Inhale 2 puffs into the lungs every 6 (six) hours as needed for wheezing or shortness of breath. 04/29/18  Yes Schuyler Amor, MD  Alirocumab 150 MG/ML SOAJ Inject 150 mg as directed every 14 (fourteen) days.    [provider]  allopurinol (ZYLOPRIM) 100 MG tablet Take 200 mg by mouth daily. 03/18/18   [provider]  amiodarone (PACERONE) 200 MG tablet Take 1 tablet (200 mg total) by mouth 2 (two) times daily. 04/13/20   Shawna Clamp, MD  amLODipine (NORVASC) 10 MG tablet Take 10 mg by mouth daily. 07/26/19   [provider]  apixaban (ELIQUIS) 5 MG TABS tablet Take 5 mg by mouth 2 (two) times daily.    [provider]  atorvastatin (LIPITOR) 80 MG tablet Take 80 mg by mouth every evening.  09/14/17   [provider]  bumetanide (BUMEX) 2 MG tablet Take 2 mg by mouth 2 (two) times daily. 12/06/19   [provider]  cholecalciferol (VITAMIN D) 1000 units tablet Take 1,000 Units by mouth daily.    [provider]  empagliflozin (JARDIANCE) 10 MG TABS tablet Take 10 mg by mouth daily. 03/12/20   [provider]  ezetimibe (ZETIA) 10 MG tablet Take 10 mg by mouth daily. 10/14/17   [provider]  FLUoxetine (PROZAC) 40 MG capsule Take 80 mg by mouth daily. 12/04/17   [provider]  gabapentin (NEURONTIN) 100 MG capsule Take 1 capsule (100 mg total) by mouth 2 (two) times daily. 04/25/20   Aline August, MD  hydrOXYzine (ATARAX/VISTARIL) 10 MG tablet Take 10 mg by mouth daily. 07/26/19   [provider]  insulin aspart (NOVOLOG FLEXPEN) 100 UNIT/ML FlexPen Inject 22 Units into the skin in the morning. 07/26/19   [provider]  insulin detemir (LEVEMIR) 100 UNIT/ML injection Inject 0.1 mLs (10 Units total) into the skin at bedtime. 04/25/20   Aline August, MD  isosorbide mononitrate (IMDUR) 30 MG 24 hr tablet Take 2 tablets (60 mg total) by mouth daily. Do not take on dialysis days 04/25/20   Aline August, MD  liraglutide (VICTOZA) 18 MG/3ML SOPN Inject 0.6 mg into the skin once a week. 07/26/19   [provider]  loperamide (IMODIUM) 2 MG capsule Take 2 mg by mouth as needed for diarrhea or loose stools.    [provider]  metoprolol succinate (TOPROL-XL) 100 MG 24 hr tablet Take 100 mg by mouth daily. 01/03/20   [provider]  mirtazapine (REMERON) 7.5 MG tablet Take 7.5 mg by mouth at bedtime.    [provider]  Multiple Vitamin (MULTIVITAMIN WITH MINERALS) TABS tablet Take 1 tablet by mouth daily. 10/31/18   Vaughan Basta, MD  nitroGLYCERIN (NITROLINGUAL) 0.4 MG/SPRAY spray Place 1 spray under the tongue every 5 (five) minutes x 3 doses as needed for chest pain.    [provider]  omeprazole (PRILOSEC) 40 MG capsule Take 40 mg by mouth 2 (two) times daily. 11/25/19   [provider]  ondansetron (ZOFRAN) 4 MG tablet Take 4 mg by mouth every 12 (twelve) hours as needed for nausea. 01/04/20   [provider]  ranolazine (RANEXA) 500 MG 12 hr tablet Take 500 mg by mouth 2 (two) times daily.  [provider]  RENVELA 800 MG tablet Take 2,400 mg by mouth 3 (three) times daily with meals. 10/14/17   [provider]  sevelamer (RENAGEL) 800 MG tablet Take 800 mg by mouth daily. 03/20/20   [provider]  ticagrelor (BRILINTA) 90 MG TABS tablet Take 90 mg by mouth 2 (two) times daily.    [provider]  traZODone (DESYREL) 50 MG tablet Take 50 mg by mouth daily. 07/26/19   [provider]  VOLTAREN 1 % GEL Apply 2 g topically 3 (three) times daily as needed (left leg pain).  12/04/17   [provider]     Vitals:   05/12/20 0615 05/12/20 0630 05/12/20 0800 05/12/20 1023  BP:  (!) 183/105 (!) 171/85 (!) 177/108  Pulse: 77 78 73 76  Resp: 20 14 (!) 3   Temp:   98.1 F (36.7 C)   TempSrc:   Oral   SpO2: 100% 98% 98%   Weight:       Exam Gen alert, obese, pleasant WM No rash, cyanosis or gangrene Sclera anicteric, throat clear  No jvd or bruits Chest clear bilat to bases no rales RRR no MRG Abd soft ntnd no mass or ascites +bs GU normal male MS no joint effusions or deformity Ext 1+ L pretib edema, no other edema Neuro is alert, Ox 3, mild L arm weakness    Home meds:  - bumex 2 bid/ zetia 10/ sl ntg prn  - renvela 3 ac tid  - empagliflozin 10 qd/ insulin aspart 22u qam/ liraglutide 0.6 q  wk  - remeron 7.5 hs  - alirocumab '150mg'$  q 14d  - prn's/ vitamins/ supplements    CXR - IMPRESSION: No acute cardiopulmonary abnormality.   OP HD: [facility] [id number]) - no answer today   [name]   Assessment/ Plan: 1. Mass on thoracic spine - for MRI w/ contrast requested by Neurosurg, per RN this will be done on Monday. Important to have dialysis after the MRI on the same day and then will need another HD session ideally the next day as well to complete removal of the contrast.  2. ESRD - on HD [name]. Missed HD Wed this week. Had HD yesterday. Plan HD later today or possibly tomorrow.  3. BP/ volume - no vol excess on exam, CXR clear. BP's wnl, not on BP lowering meds at home. UF 2-2.5 L w HD.  4. Chron atrial fib - on eliquis at home 5. CAD sp PCI/ stenting 6. Chron syst CHF/ sp AICD 7. Depression 8. 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

## 2020-05-12 NOTE — Consult Note (Signed)
NAME:  Edwin [name], MRN:  [id number], DOB:  [birth year], LOS: 0 ADMISSION DATE:  05/11/2020, CONSULTATION DATE:  05/12/20 REFERRING MD:  Myna Hidalgo, CHIEF COMPLAINT:  AMS   Brief History:  50 y.o. M with PMH of CAD, ischemic cardiomyopathy, Atrial Fibrillation on Eliquis, ESRD, DM with recent falls and found to have T1 lesion, sent from [facility] for MRI as he has a pacemaker.  He has had recent episodes of shaking that have been extensively work-ed up.  He had a similar episode overnight with concern for respiratory depression so was transferred to intensive care.   History of Present Illness:  Edwin [name] is a 50 y.o. M with PMH of CAD, ischemic cardiomyopathy, Atrial Fibrillation, ESRD, DM with recent falls and found to have T1 lesion, sent from [facility] for MRI as he has a pacemaker.  He has been admitted recently for syncopal episodes and possible seizure-like activity that was work-up by neurology and not seizure activity on EEG.  Most recently was admitted to [facility] after a fall and back pain for about one week with L-sided numbness with chest pain.  Chest CT was obtained which incidentally showed T1 lesion concerning for tumor.  He was seen by neurosurgery, and as he has a pacemaker, was sent to [facility] for MRI compatible with pacemaker. Also recently diagnosed with Covid-19, but clear CXR.   He was stable on arrival, but overnight had chest pain and L-sided shaking with concern for respiratory depression, so a code blue was called. Pt did not lose pulses.  He is awake and responsive after ICU transfer, though still with intermittent AMS and L-sided numbness and weakness  Past Medical History:   has a past medical history of CAD (coronary artery disease), Chronic systolic heart failure ([place]), Diabetes mellitus without complication ([place]), Heart attack ([place]), Hyperlipidemia, Hypertension, Ischemic cardiomyopathy, Paroxysmal atrial fibrillation ([place]), and Renal disorder.   Significant Hospital Events:  2/11 transfer  from [facility], admit to [facility] 2/12 Transfer to ICU, PCCM consult  Consults:  PCCM Neurosurgery  Procedures:    Significant Diagnostic Tests:   2/11 CT head>>no acute findings  2/11 CT C-spine>>no fx  2/11 CTA chest/abd/pelvis>>1. No evidence of thoracic or abdominal aortic aneurysm or dissection. 2. Coronary artery calcifications are noted suggesting coronary artery disease. 3. Bilateral renal atrophy is noted. 4. Stable right adrenal myelolipoma. 5. No acute abnormality seen in the chest, abdomen or pelvis. 6. Aortic atherosclerosis.  2/11 CT T-spine>> Soft tissue replacing part of the posterior T1 vertebral body with erosion of cortex and potential epidural extension. MRI is recommended for further evaluation.   Micro Data:    Antimicrobials:     Interim History / Subjective:   Pt intermittently somnolent after transfer with elevated BP but hemodynamically stable and without respiratory depression  Objective   Blood pressure (!) 131/108, pulse 71, temperature 97.9 F (36.6 C), temperature source Oral, resp. rate 20, weight (!) 158.9 kg, SpO2 100 %.        Intake/Output Summary (Last 24 hours) at 05/12/2020 0412 Last data filed at 05/12/2020 0300 Gross per 24 hour  Intake --  Output 0 ml  Net 0 ml   Filed Weights   05/12/20 0300  Weight: (!) 158.9 kg    General:  Obese M, sleeping but arousable and in no distress HEENT: MM pink/moist Neuro: somnolent, arousable, moving all extremities but with decreased strength and sensation on the L, pupils equal and reactive, no slurred speech, no obvious CN deficit  CV:  s1s2 rrr, no m/r/g, chest pain to palpation of chest wall PULM:  CTAB on 4L [place] GI: soft, bsx4 active  Extremities: warm/dry, 1+ edema  Skin: no rashes or lesions   Resolved Hospital Problem list     Assessment & Plan:   Seizure-like episode with encephalopathy  -Dr. Myna Hidalgo spoke with Neurology overnight surrounding repeated episode of shaking,  felt that there was no further neuro w/u indicated except MRI given his recent extensive work-up.  MRI called and unable to take patient stat overnight -ABG without hypercarbia  P: -Monitor in ICU overnight, it appears that this is a continuation of the syncopal/seizure-like activity that he has been having over the last few weeks and has had a thorough evaluation by Neurology -MRI brain and spine as soon as able, if stable can likely transfer out of the ICU later today -avoid sedating medications    T1 Lesion Concern for mass/aggressive tumor P: -MRI pending, neurosurgery following    CAD, ischemic cardiomyopathy, atrial fibrillation -He has been c/o chest pain since admission to [facility] -CT chest negative for acute findings P:  -troponins have been flat in 20-30's and EKG without ischemic findings  -add prn Labetalol, NPO secondary to AMS so holding home Metoprolol and Norvasc -eliquis was held on admission, likely resume post-MRI -pacemaker interrogation at the end of January 2022 shows intermittent atrial tachycardia   ESRD -M/W/F, has R permacath P: -continue dialysis, monitor electrolytes and volume status   Covid-19 infection Day 19, continue isolation until day 21 CXR clear without hypoxia  Best practice (evaluated daily)  Diet: NPO Pain/Anxiety/Delirium protocol (if indicated): per primary VAP protocol (if indicated):  DVT prophylaxis: heparim GI prophylaxis: n/a Glucose control: SSI Mobility: bed rest Disposition:ICU  Goals of Care:  Last date of multidisciplinary goals of care discussion: per primary Family and staff present:  Summary of discussion:  Follow up goals of care discussion due:  Code Status: full code  Labs   CBC: Recent Labs  Lab 05/11/20 0909 05/12/20 0114 05/12/20 0203 05/12/20 0218  WBC 9.8 9.7 9.1  --   NEUTROABS 7.9*  --  7.3  --   HGB 11.1* 11.4* 10.8* 11.6*  HCT 34.9* 37.9* 35.8* 34.0*  MCV 89.3 92.2 92.0  --   PLT 218 218  217  --     Basic Metabolic Panel: Recent Labs  Lab 05/11/20 0909 05/12/20 0114 05/12/20 0203 05/12/20 0218  NA 137  --  137 137  K 4.7  --  5.1 5.0  CL 106  --  106  --   CO2 20*  --  21*  --   GLUCOSE 161*  --  160*  --   BUN 39*  --  38*  --   CREATININE 5.36* 6.17* 6.27*  --   CALCIUM 7.6*  --  7.7*  --   MG  --   --  1.8  --    GFR: Estimated Creatinine Clearance: 22.3 mL/min (A) (by C-G formula based on SCr of 6.27 mg/dL (H)). Recent Labs  Lab 05/11/20 0909 05/12/20 0114 05/12/20 0203  WBC 9.8 9.7 9.1  LATICACIDVEN  --   --  0.9    Liver Function Tests: Recent Labs  Lab 05/11/20 0909 05/12/20 0203  AST 13* 11*  ALT 9 11  ALKPHOS 76 89  BILITOT 0.5 0.9  PROT 6.6 6.6  ALBUMIN 2.9* 2.8*   No results for input(s): LIPASE, AMYLASE in the last 168 hours. No results for input(s): AMMONIA  in the last 168 hours.  ABG    Component Value Date/Time   PHART 7.262 (L) 05/12/2020 0218   PCO2ART 47.4 05/12/2020 0218   PO2ART 186 (H) 05/12/2020 0218   HCO3 21.4 05/12/2020 0218   TCO2 23 05/12/2020 0218   ACIDBASEDEF 6.0 (H) 05/12/2020 0218   O2SAT 99.0 05/12/2020 0218     Coagulation Profile: Recent Labs  Lab 05/11/20 0909  INR 1.2    Cardiac Enzymes: No results for input(s): CKTOTAL, CKMB, CKMBINDEX, TROPONINI in the last 168 hours.  HbA1C: Hgb A1c MFr Bld  Date/Time Value Ref Range Status  04/12/2020 01:32 PM 8.1 (H) 4.8 - 5.6 % Final    Comment:    (NOTE) Pre diabetes:          5.7%-6.4%  Diabetes:              >6.4%  Glycemic control for   <7.0% adults with diabetes   01/14/2020 02:35 AM 8.1 (H) 4.8 - 5.6 % Final    Comment:    (NOTE)         Prediabetes: 5.7 - 6.4         Diabetes: >6.4         Glycemic control for adults with diabetes: <7.0     CBG: No results for input(s): GLUCAP in the last 168 hours.  Review of Systems:   Unable to obtain secondary to encephalopathy  Past Medical History:  He,  has a past medical history  of CAD (coronary artery disease), Chronic systolic heart failure ([place]), Diabetes mellitus without complication ([place]), Heart attack ([place]), Hyperlipidemia, Hypertension, Ischemic cardiomyopathy, Paroxysmal atrial fibrillation ([place]), and Renal disorder.   Surgical History:   Past Surgical History:  Procedure Laterality Date  . [employer] pacemaker    . CARDIAC SURGERY    . IRRIGATION AND DEBRIDEMENT KNEE Left 12/17/2017   Procedure: LEFT KNEE DEBRIDEMENT AND SYNOVECTOMY;  Surgeon: Thornton Park, MD;  Location: [facility] ORS;  Service: Orthopedics;  Laterality: Left;  . JOINT REPLACEMENT    . LEFT HEART CATH AND CORONARY ANGIOGRAPHY Left 03/09/2018   Procedure: LEFT HEART CATH AND CORONARY ANGIOGRAPHY;  Surgeon: Nelva Bush, MD;  Location: [place] [facility];  Service: Cardiovascular;  Laterality: Left;  . LEFT HEART CATH AND CORONARY ANGIOGRAPHY N/A 01/25/2020   Procedure: LEFT HEART CATH AND CORONARY ANGIOGRAPHY;  Surgeon: Isaias Cowman, MD;  Location: [place] [facility];  Service: Cardiovascular;  Laterality: N/A;  . PERCUTANEOUS CORONARY STENT INTERVENTION (PCI-S)       Social History:   reports that he has quit smoking. His smokeless tobacco use includes snuff. He reports that he does not drink alcohol and does not use drugs.   Family History:  His family history includes COPD in his father; Diabetes in his mother; Peripheral vascular disease in his mother.   Allergies Allergies  Allergen Reactions  . Losartan     Hyperkalemia (K>6)  . Morphine Anxiety  . Morphine And Related Other (See Comments)    Overdose in hospital per patient report     Home Medications  Prior to Admission medications   Medication Sig Start Date End Date Taking? Authorizing Provider  acetaminophen (TYLENOL) 500 MG tablet Take 500-1,000 mg by mouth every 6 (six) hours as needed for mild pain, moderate pain or fever.   Yes [provider]  albuterol (PROVENTIL HFA;VENTOLIN HFA)  108 (90 Base) MCG/ACT inhaler Inhale 2 puffs into the lungs every 6 (six) hours as needed for wheezing  or shortness of breath. 04/29/18  Yes Schuyler Amor, MD  Alirocumab 150 MG/ML SOAJ Inject 150 mg as directed every 14 (fourteen) days.    [provider]  allopurinol (ZYLOPRIM) 100 MG tablet Take 200 mg by mouth daily. 03/18/18   [provider]  amiodarone (PACERONE) 200 MG tablet Take 1 tablet (200 mg total) by mouth 2 (two) times daily. 04/13/20   Shawna Clamp, MD  amLODipine (NORVASC) 10 MG tablet Take 10 mg by mouth daily. 07/26/19   [provider]  apixaban (ELIQUIS) 5 MG TABS tablet Take 5 mg by mouth 2 (two) times daily.    [provider]  atorvastatin (LIPITOR) 80 MG tablet Take 80 mg by mouth every evening.  09/14/17   [provider]  bumetanide (BUMEX) 2 MG tablet Take 2 mg by mouth 2 (two) times daily. 12/06/19   [provider]  cholecalciferol (VITAMIN D) 1000 units tablet Take 1,000 Units by mouth daily.    [provider]  empagliflozin (JARDIANCE) 10 MG TABS tablet Take 10 mg by mouth daily. 03/12/20   [provider]  ezetimibe (ZETIA) 10 MG tablet Take 10 mg by mouth daily. 10/14/17   [provider]  FLUoxetine (PROZAC) 40 MG capsule Take 80 mg by mouth daily. 12/04/17   [provider]  gabapentin (NEURONTIN) 100 MG capsule Take 1 capsule (100 mg total) by mouth 2 (two) times daily. 04/25/20   Aline August, MD  hydrOXYzine (ATARAX/VISTARIL) 10 MG tablet Take 10 mg by mouth daily. 07/26/19   [provider]  insulin aspart (NOVOLOG FLEXPEN) 100 UNIT/ML FlexPen Inject 22 Units into the skin in the morning. 07/26/19   [provider]  insulin detemir (LEVEMIR) 100 UNIT/ML injection Inject 0.1 mLs (10 Units total) into the skin at bedtime. 04/25/20   Aline August, MD  isosorbide mononitrate (IMDUR) 30 MG 24 hr tablet Take 2 tablets (60 mg total) by mouth daily. Do not take on  dialysis days 04/25/20   Aline August, MD  liraglutide (VICTOZA) 18 MG/3ML SOPN Inject 0.6 mg into the skin once a week. 07/26/19   [provider]  loperamide (IMODIUM) 2 MG capsule Take 2 mg by mouth as needed for diarrhea or loose stools.    [provider]  metoprolol succinate (TOPROL-XL) 100 MG 24 hr tablet Take 100 mg by mouth daily. 01/03/20   [provider]  mirtazapine (REMERON) 7.5 MG tablet Take 7.5 mg by mouth at bedtime.    [provider]  Multiple Vitamin (MULTIVITAMIN WITH MINERALS) TABS tablet Take 1 tablet by mouth daily. 10/31/18   Vaughan Basta, MD  nitroGLYCERIN (NITROLINGUAL) 0.4 MG/SPRAY spray Place 1 spray under the tongue every 5 (five) minutes x 3 doses as needed for chest pain.    [provider]  omeprazole (PRILOSEC) 40 MG capsule Take 40 mg by mouth 2 (two) times daily. 11/25/19   [provider]  ondansetron (ZOFRAN) 4 MG tablet Take 4 mg by mouth every 12 (twelve) hours as needed for nausea. 01/04/20   [provider]  ranolazine (RANEXA) 500 MG 12 hr tablet Take 500 mg by mouth 2 (two) times daily.    [provider]  RENVELA 800 MG tablet Take 2,400 mg by mouth 3 (three) times daily with meals. 10/14/17   [provider]  sevelamer (RENAGEL) 800 MG tablet Take 800 mg by mouth daily. 03/20/20   [provider]  ticagrelor (BRILINTA) 90 MG TABS tablet Take  90 mg by mouth 2 (two) times daily.    [provider]  traZODone (DESYREL) 50 MG tablet Take 50 mg by mouth daily. 07/26/19   [provider]  VOLTAREN 1 % GEL Apply 2 g topically 3 (three) times daily as needed (left leg pain).  12/04/17   [provider]     Critical care time: 50 minutes    CRITICAL CARE Performed by: Otilio Carpen Tennyson Kallen   Total critical care time: 50 minutes  Critical care time was exclusive of separately billable procedures and treating other patients.  Critical care  was necessary to treat or prevent imminent or life-threatening deterioration.  Critical care was time spent personally by me on the following activities: development of treatment plan with patient and/or surrogate as well as nursing, discussions with consultants, evaluation of patient's response to treatment, examination of patient, obtaining history from patient or surrogate, ordering and performing treatments and interventions, ordering and review of laboratory studies, ordering and review of radiographic studies, pulse oximetry and re-evaluation of patient's condition.  Otilio Carpen Adalin Vanderploeg, PA-C [facility] See Amion for pager If no response to pager , please call [phone] until 7pm After 7:00 pm call Elink  [phone]?[place]

## 2020-05-12 NOTE — Consult Note (Signed)
Neurosurgery-New Consultation Evaluation 05/12/2020 Edwin [name] [id number]  Identifying Statement: Edwin [name] is a 50 y.o. male from [place] [id number] with cervical thoracic mass  Physician Requesting Consultation: [place] [facility]  History of Present Illness: Edwin [name] is here for evaluation of chest pain he says that started at dialysis.  He was given aspirin for this and nitroglycerin.  He is being evaluated by the emergency department for possible causes.  However, he does comment that in the last few days he does feel like he is developing more difficulty with his left hand.  He describes numbness and weakness.  He additionally has some numbness in his lower extremity on the left.  He does have a history of prior lumbar spine pathology but he believes the symptoms are newer.  He is having some pain in the hand.  A CT scan of the cervical and thoracic spine was obtained with concern for possible mass.  The patient does have an ICD which prevents MRI at this facility.  Past Medical History:  Past Medical History:  Diagnosis Date  . CAD (coronary artery disease)   . Chronic systolic heart failure ([place])   . Diabetes mellitus without complication ([place])   . Heart attack ([place])   . Hyperlipidemia   . Hypertension   . Ischemic cardiomyopathy   . Paroxysmal atrial fibrillation ([name])   . Renal disorder     Social History: Social History   Socioeconomic History  . Marital status: Married    Spouse name: Not on file  . Number of children: Not on file  . Years of education: Not on file  . Highest education level: Not on file  Occupational History  . Not on file  Tobacco Use  . 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 Smokeless tobacco: Current User    Types: Snuff  Vaping Use  . Vaping Use: Never used  Substance and Sexual Activity  . Alcohol use: Never  . Drug use: Never  . Sexual activity: Not Currently    Birth control/protection: None  Other Topics Concern   . Not on file  Social History Narrative  . Not on file   Social Determinants of Health   Financial Resource Strain: Not on file  Food Insecurity: Not on file  Transportation Needs: Not on file  Physical Activity: Not on file  Stress: Not on file  Social Connections: Not on file  Intimate Partner Violence: Not on file    Family History: Family History  Problem Relation Age of Onset  . Diabetes Mother   . Peripheral vascular disease Mother   . COPD Father     Review of Systems:  Review of Systems - General ROS: Negative Psychological ROS: Negative Ophthalmic ROS: Negative ENT ROS: Negative Hematological and Lymphatic ROS: Negative  Endocrine ROS: Negative Respiratory ROS: Negative Cardiovascular ROS: Negative Gastrointestinal ROS: Negative Genito-Urinary ROS: Negative Musculoskeletal ROS: Negative Neurological ROS: Positive for numbness, weakness Dermatological ROS: Negative  Physical Exam: BP (!) 171/85 (BP Location: Left Arm)   Pulse 73   Temp 98.1 F (36.7 C) (Oral)   Resp (!) 3   Wt (!) 158.9 kg   SpO2 98%   BMI 44.98 kg/m  Body mass index is 44.98 kg/m. Body surface area is 2.88 meters squared. General appearance: Alert, cooperative, in no acute distress Head: Normocephalic, atraumatic Ext: Edema noted in lower extremities  Neurologic exam:  Mental status: alertness: alert,  affect: normal Speech: fluent and clear Motor:strength symmetric 5/5 in  bilateral deltoid, bicep, tricep.  He is 5-5 in right grip and interossei 4 out of 5 in left grip and interossei He appears 5-5 in bilateral plantarflexion and 5 out of 5 in the right dorsiflexion but there is weak dorsiflexion on the left which appears to be at baseline.  He appears 5-5 in bilateral hip flexion. Sensory: Decreased to light touch over bilateral hands in a nondermatomal pattern, right greater than left.  He does have decreased sensation in the left leg throughout all dermatomes Gait: Not  tested  Laboratory: Results for orders placed or performed during the hospital encounter of 05/11/20  CBC  Result Value Ref Range   WBC 9.7 4.0 - 10.5 K/uL   RBC 4.11 (L) 4.22 - 5.81 MIL/uL   Hemoglobin 11.4 (L) 13.0 - 17.0 g/dL   HCT 37.9 (L) 39.0 - 52.0 %   MCV 92.2 80.0 - 100.0 fL   MCH 27.7 26.0 - 34.0 pg   MCHC 30.1 30.0 - 36.0 g/dL   RDW 16.1 (H) 11.5 - 15.5 %   Platelets 218 150 - 400 K/uL   nRBC 0.0 0.0 - 0.2 %  Creatinine, serum  Result Value Ref Range   Creatinine, Ser 6.17 (H) 0.61 - 1.24 mg/dL   GFR, Estimated 10 (L) >60 mL/min  Comprehensive metabolic panel  Result Value Ref Range   Sodium 137 135 - 145 mmol/L   Potassium 5.1 3.5 - 5.1 mmol/L   Chloride 106 98 - 111 mmol/L   CO2 21 (L) 22 - 32 mmol/L   Glucose, Bld 160 (H) 70 - 99 mg/dL   BUN 38 (H) 6 - 20 mg/dL   Creatinine, Ser 6.27 (H) 0.61 - 1.24 mg/dL   Calcium 7.7 (L) 8.9 - 10.3 mg/dL   Total Protein 6.6 6.5 - 8.1 g/dL   Albumin 2.8 (L) 3.5 - 5.0 g/dL   AST 11 (L) 15 - 41 U/L   ALT 11 0 - 44 U/L   Alkaline Phosphatase 89 38 - 126 U/L   Total Bilirubin 0.9 0.3 - 1.2 mg/dL   GFR, Estimated 10 (L) >60 mL/min   Anion gap 10 5 - 15  CBC with Differential/Platelet  Result Value Ref Range   WBC 9.1 4.0 - 10.5 K/uL   RBC 3.89 (L) 4.22 - 5.81 MIL/uL   Hemoglobin 10.8 (L) 13.0 - 17.0 g/dL   HCT 35.8 (L) 39.0 - 52.0 %   MCV 92.0 80.0 - 100.0 fL   MCH 27.8 26.0 - 34.0 pg   MCHC 30.2 30.0 - 36.0 g/dL   RDW 16.0 (H) 11.5 - 15.5 %   Platelets 217 150 - 400 K/uL   nRBC 0.0 0.0 - 0.2 %   Neutrophils Relative % 81 %   Neutro Abs 7.3 1.7 - 7.7 K/uL   Lymphocytes Relative 11 %   Lymphs Abs 1.0 0.7 - 4.0 K/uL   Monocytes Relative 7 %   Monocytes Absolute 0.7 0.1 - 1.0 K/uL   Eosinophils Relative 1 %   Eosinophils Absolute 0.1 0.0 - 0.5 K/uL   Basophils Relative 0 %   Basophils Absolute 0.0 0.0 - 0.1 K/uL   Immature Granulocytes 0 %   Abs Immature Granulocytes 0.04 0.00 - 0.07 K/uL  Lactic acid, plasma   Result Value Ref Range   Lactic Acid, Venous 0.9 0.5 - 1.9 mmol/L  Lactic acid, plasma  Result Value Ref Range   Lactic Acid, Venous 1.0 0.5 - 1.9 mmol/L  Magnesium  Result  Value Ref Range   Magnesium 1.8 1.7 - 2.4 mg/dL  TSH  Result Value Ref Range   TSH 5.446 (H) 0.350 - 4.500 uIU/mL  T4, free  Result Value Ref Range   Free T4 0.80 0.61 - 1.12 ng/dL  I-STAT 7, (LYTES, BLD GAS, ICA, H+H)  Result Value Ref Range   pH, Arterial 7.262 (L) 7.350 - 7.450   pCO2 arterial 47.4 32.0 - 48.0 mmHg   pO2, Arterial 186 (H) 83.0 - 108.0 mmHg   Bicarbonate 21.4 20.0 - 28.0 mmol/L   TCO2 23 22 - 32 mmol/L   O2 Saturation 99.0 %   Acid-base deficit 6.0 (H) 0.0 - 2.0 mmol/L   Sodium 137 135 - 145 mmol/L   Potassium 5.0 3.5 - 5.1 mmol/L   Calcium, Ion 1.11 (L) 1.15 - 1.40 mmol/L   HCT 34.0 (L) 39.0 - 52.0 %   Hemoglobin 11.6 (L) 13.0 - 17.0 g/dL   Patient temperature 98.6 F    Collection site Radial    Drawn by RT    Sample type ARTERIAL   Troponin I (High Sensitivity)  Result Value Ref Range   Troponin I (High Sensitivity) 28 (H) <18 ng/L  Troponin I (High Sensitivity)  Result Value Ref Range   Troponin I (High Sensitivity) 29 (H) <18 ng/L   I personally reviewed labs  Imaging: CT cervical spine: There is straightening of the lordotic curvature.  There is no obvious fracture or listhesis.  There is minimal degenerative disease noted.  At the lower C7-T1 level there does appear to be some hypodensity within the bone on the left on sagittal images but this does not appear as prominent on the axials.   Impression/Plan:  Edwin [name] here with symptoms concerning for left-sided numbness, weakness in the leg and distal arm.  Given this, I do think further evaluation of his cervical spine is warranted.  He will need an MRI of the cervical spine.  Pathology intracranially appears less likely but is also a possibility.  Once MRI is obtained, further evaluation can be performed.   1.  Diagnosis:  Left side numbness, weakness  2.  Plan -MRI of the cervical/ thoracic spine recommended

## 2020-05-12 NOTE — Progress Notes (Signed)
Tillman Sers, RN, RN called and notified the pt's HD tx has been moved to 05/13/2020.

## 2020-05-12 NOTE — Progress Notes (Signed)
This nurse and Lelan Pons, RN checked on patient before stepping into another patients room to bedside report. No needs noted.  Upon exiting the other patients room the patient was noted to have disconnected himself from the monitor, lowered the bed rail, and walked to the bathroom.   When the patient was asked why he didn't ask for help, and use his call light he stated "I am tired of being here, if I could I would have my wife take me home."  Patient assisted back to bed, Call light in reach. Needs addressed. VS stable.  Patient had used call light several times prior to this to ask assistance with the urinal, mouth swabs and repositioning.   Reinforced to patient the importance of his safety and encouraged use of his call light.

## 2020-05-12 NOTE — Plan of Care (Signed)
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 Although code blue had been called, he did not have an arrest or lose pulses. Awaiting MRI that has been planned by NS.  O: BP (!) 177/108   Pulse 76   Temp 98.1 F (36.7 C) (Oral)   Resp (!) 3   Wt (!) 158.9 kg   SpO2 98%   BMI 44.98 kg/m  Chronically ill-appearing man lying in bed comfortably, in whole sleep [place]/AT, eyes anicteric CTAB, breathing comfortably on [place]>> turned off to room air. Regular rate and rhythm, no murmurs Abdomen soft, nontender, obese No peripheral edema, no clubbing or cyanosis Awake, communicating normally.  Skin warm, dry, no rashes  CT c/a/p reviewed> minimal GGO, old healed left posterolateral rib fx  A&P: covid- 19 -If requiring O2, Cooprider qualify for steroids. Has not had hypoxia, but oxygen keeps getting turned on. Discussed with bedside nurse, would like to discontinue supplemental oxygen that is unnecessary. Was not previously requiring steroid, remdesivir, or immunomodulators when previously admitted.  T1 mass -NS management; MRI w/ contrast pending-cannot be performed until Monday due to lack of staff to manage cardiac devices while he is an MRI on weekend. Will need to coordinate with nephrology to be dialyzed appropriately to reduce NSF risk.  Episode of behavioral disruption. Has not previously been able to be identified as a seizure. -Neuro updated overnight- did not recommend starting AED -If has repeat episodes, recommend calling neurology for repeat evaluation. Transferring to ICU does not seem necessary given the repetitive nature of these events chronically.  Okay to transfer out of ICU. PCCM will sign off. Please call with questions.   Julian Hy, DO 05/12/20 6:39 PM [facility]  From 7AM- 7PM if no response to pager, please call [phone]. After hours, 7PM- 7AM, please  call Elink  [phone].

## 2020-05-12 NOTE — Progress Notes (Addendum)
PROGRESS NOTE                                                                                                                                                                                                             Patient Demographics:    Edwin [name], is a 50 y.o. male, DOB - 01/18/1971, [id number]  Outpatient Primary MD for the patient is Caryl Never, MD   Admit date - 05/11/2020   LOS - 0  No chief complaint on file.      Brief Narrative: Patient is a 50 y.o. male with PMHx of ESRD on HD [name], chronic systolic heart failure, PAF on Eliquis, CAD-numerous PCI's-most recently in December 2021, s/p permanent pacemaker implantation, pseudoseizures, recent Covid positive test on 1/24-presented to [facility] on 2/11 with intermittent chest pain, and subtle left-sided weakness.  CT T-spine showed a T1 vertebral body mass-transfered to [facility] for PPM compatible MRI imaging of the spine.  COVID-19 vaccinated status:  Significant Events: 2/11>> evaluated at [facility] ED for chest pain-left-sided weakness-found to have T1 mass on CT scan T-spine 2/11>> transferred to [facility] for PPM compatible MRI imaging of spine. 2/12>> unresponsive-CODE BLUE-briefly transferred to ICU. Never lost pulse 2/12>>stable-transfer back to telemetry  Significant studies: 2/11>> CT angio chest/abdomen: No dissection, stable right adrenal myolipoma 2/11>> CT head: No acute intracranial abnormality 2/11>> CT C-spine: No acute C-spine fracture 2/11>> CT T-spine: Soft tissue replacing post T1 body with erosion of the cortex/epidural extension  2/11>> chest x-ray: No acute cardiopulmonary abnormality  COVID-19 medications: None  Antibiotics: None  Microbiology data: None  Procedures: None  Consults: Neurosurgery, nephrology, PCCM  DVT prophylaxis: apixaban (ELIQUIS) tablet 5 mg     Subjective:    Edwin [name] today is upset that MRI of the spine  cannot be done today due to technical/staffing issues.  He does not have any chest pain.  He is awake and alert.  Continues to complain of subtle left-sided weakness.   Assessment  & Plan :   Left-sided weakness: Unclear etiology-CT imaging positive for T1 mass.  Neurosurgery has ordered MRI brain along with C-spine and T-spine with contrast (neurosurgery aware of ESRD-see note)-still pending. Per bedside RN-likely will not be able to done over the weekend due to staffing issue (apparently pacemaker compatible MRI needs other staff that are not available over the  weekends).  Have asked RN-to touch base with the administrative coordinator to see if this can be done over the weekend.  Await MRI-await further recommendations from neurosurgery.  Per nursing staff-pain seems to be reasonably well controlled with Tylenol-if not-have ordered narcotics for breakthrough pain.  Chest pain: Felt to be atypical-troponins not significantly elevated.  Most recent PCI December 2021-see below  Pseudoseizures/unresponsive episode: See note from Dr. Vanetta Shawl had prior extensive evaluation in the past.  Dr. Myna Hidalgo discussed with neurology on-call-Dr. Bunnie Philips reviewed case-and did not feel any further work-up/intervention was needed.  ESRD on HD [name]:  Per patient-he did not have HD on Wednesday-only 1 hour of HD on Friday-appears to be under dialyzed-have consulted nephrology.  Anemia: Related to ESRD-no indication for transfusion-follow  PAF: Continue amiodarone,metoprolol-resume Eliquis.  Chronic systolic/diastolic heart failure: Volume status stable-diuresis with HD.  HTN: BP is significantly elevated-resuming amlodipine, Imdur and metoprolol.  CAD: Has extensive history of CAD requiring numerous PCI's--most recent PCI with balloon angioplasty to LAD/RCA on 12/28.  Remains on Brilinta/statin/metoprolol.  Followed by [facility] cardiology.  DM-2 (A1c 8.1 on 1/13): CBG's stable-continue Levemir 10 units  QHS and SSI-hold all oral hypoglycemics.    Recent Labs    05/12/20 0814 05/12/20 1201  GLUCAP 131* 210*    Peripheral neuropathy: Continue Neurontin  History of gout: Continue allopurinol  History of anxiety/depression: Continue trazodone/Prozac  Recent COVID-19 infection: Currently asymptomatic-on room air during my evaluation-is being on 10 days from his last positive test.  Does not require any further isolation.    Lab Results  Component Value Date   [name] (A) 04/23/2020   [name] NEGATIVE 04/12/2020   [name] NEGATIVE 01/23/2020   [name] NEGATIVE 01/14/2020     Obesity: Estimated body mass index is 44.98 kg/m as calculated from the following:   Height as of an earlier encounter on 05/11/20: '6\' 2"'$  (1.88 m).   Weight as of this encounter: 158.9 kg.    GI prophylaxis: PPI  ABG:    Component Value Date/Time   PHART 7.262 (L) 05/12/2020 0218   PCO2ART 47.4 05/12/2020 0218   PO2ART 186 (H) 05/12/2020 0218   HCO3 21.4 05/12/2020 0218   TCO2 23 05/12/2020 0218   ACIDBASEDEF 6.0 (H) 05/12/2020 0218   O2SAT 99.0 05/12/2020 0218    Vent Settings: N/A   Condition - Stable  Family Communication  :  Spouse-[name]-[phone] updated over the phone 2/12-upset that MRI cannot be done, asking for specific diagnosis-that depends on MRI studies, asking about exactly when dialysis will be done etc-attempted to answer given current information-explained that HD/MRI scheduling will be dependent on staffing-which is beyond my control. She then claimed that I "had a attitude" "Will u just let him be in pain". Clearly unhappy with the answers that I can provide-wants to talk with "my boss". I will touch base with director on call for guidance-but spouse is aware that she will be called by my partner tomorrow.  Code Status :  Full Code  Diet :  Diet Order            Diet renal with fluid restriction Fluid restriction: 1200 mL Fluid; Room service  appropriate? Yes; Fluid consistency: Thin  Diet effective now                  Disposition Plan  :   Status is: Observation  The patient will require care spanning > 2 midnights and should be moved to inpatient because: Inpatient level of care  appropriate due to severity of illness  Dispo: The patient is from: Home              Anticipated d/c is to: TBD              Anticipated d/c date is: > 3 days              Patient currently is not medically stable to d/c.   Difficult to place patient No   Barriers to discharge: T1 mass-awaiting further work up  Antimicorbials  :    Anti-infectives (From admission, onward)   None      Inpatient Medications  Scheduled Meds: . allopurinol  200 mg Oral Daily  . amiodarone  200 mg Oral BID  . amLODipine  10 mg Oral Daily  . apixaban  5 mg Oral BID  . atorvastatin  80 mg Oral QPM  . Chlorhexidine Gluconate Cloth  6 each Topical Daily  . [START ON 05/13/2020] Chlorhexidine Gluconate Cloth  6 each Topical Q0600  . FLUoxetine  80 mg Oral Daily  . gabapentin  100 mg Oral BID  . insulin aspart  0-5 Units Subcutaneous QHS  . insulin aspart  0-6 Units Subcutaneous TID WC  . insulin detemir  10 Units Subcutaneous QHS  . isosorbide mononitrate  60 mg Oral Once per day on Sun Tue Thu Sat  . metoprolol succinate  100 mg Oral Daily  . pantoprazole  40 mg Oral Daily  . ranolazine  500 mg Oral BID  . ticagrelor  90 mg Oral BID  . traZODone  50 mg Oral Daily   Continuous Infusions: PRN Meds:.acetaminophen **OR** acetaminophen, albuterol, calcium carbonate (dosed in mg elemental calcium), feeding supplement (NEPRO CARB STEADY), labetalol, ondansetron **OR** ondansetron (ZOFRAN) IV, sorbitol   Time Spent in minutes  25  See all Orders from today for further details   Oren Binet M.D on 05/12/2020 at 12:50 PM  To page go to [email or web address] - use universal password  [facility] -  Office  [phone]    Objective:   Vitals:    05/12/20 0630 05/12/20 0800 05/12/20 1023 05/12/20 1214  BP: (!) 183/105 (!) 171/85 (!) 177/108   Pulse: 78 73 76   Resp: 14 (!) 3    Temp:  98.1 F (36.7 C)  97.9 F (36.6 C)  TempSrc:  Oral    SpO2: 98% 98%    Weight:        Wt Readings from Last 3 Encounters:  05/12/20 (!) 158.9 kg  05/11/20 (!) 158 kg  05/01/20 (!) 156.2 kg     Intake/Output Summary (Last 24 hours) at 05/12/2020 1250 Last data filed at 05/12/2020 0600 Gross per 24 hour  Intake --  Output 250 ml  Net -250 ml     Physical Exam Gen Exam:Alert awake-not in any distress HEENT:atraumatic, normocephalic Chest: B/L clear to auscultation anteriorly CVS:S1S2 regular Abdomen:soft non tender, non distended Extremities:no edema Neurology: Mild left-sided weakness.  Subtle left facial droop. Skin: no rash   Data Review:    CBC Recent Labs  Lab 05/11/20 0909 05/12/20 0114 05/12/20 0203 05/12/20 0218  WBC 9.8 9.7 9.1  --   HGB 11.1* 11.4* 10.8* 11.6*  HCT 34.9* 37.9* 35.8* 34.0*  PLT 218 218 217  --   MCV 89.3 92.2 92.0  --   MCH 28.4 27.7 27.8  --   MCHC 31.8 30.1 30.2  --   RDW 16.0* 16.1* 16.0*  --   LYMPHSABS 1.0  --  1.0  --   MONOABS 0.7  --  0.7  --   EOSABS 0.1  --  0.1  --   BASOSABS 0.0  --  0.0  --     Chemistries  Recent Labs  Lab 05/11/20 0909 05/12/20 0114 05/12/20 0203 05/12/20 0218  NA 137  --  137 137  K 4.7  --  5.1 5.0  CL 106  --  106  --   CO2 20*  --  21*  --   GLUCOSE 161*  --  160*  --   BUN 39*  --  38*  --   CREATININE 5.36* 6.17* 6.27*  --   CALCIUM 7.6*  --  7.7*  --   MG  --   --  1.8  --   AST 13*  --  11*  --   ALT 9  --  11  --   ALKPHOS 76  --  89  --   BILITOT 0.5  --  0.9  --    ------------------------------------------------------------------------------------------------------------------ No results for input(s): CHOL, HDL, LDLCALC, TRIG, CHOLHDL, LDLDIRECT in the last 72 hours.  Lab Results  Component Value Date   HGBA1C 8.1 (H)  04/12/2020   ------------------------------------------------------------------------------------------------------------------ Recent Labs    05/12/20 0200  TSH 5.446*   ------------------------------------------------------------------------------------------------------------------ No results for input(s): VITAMINB12, FOLATE, FERRITIN, TIBC, IRON, RETICCTPCT in the last 72 hours.  Coagulation profile Recent Labs  Lab 05/11/20 0909  INR 1.2    No results for input(s): DDIMER in the last 72 hours.  Cardiac Enzymes No results for input(s): CKMB, TROPONINI, MYOGLOBIN in the last 168 hours.  Invalid input(s): CK ------------------------------------------------------------------------------------------------------------------    Component Value Date/Time   BNP 574.2 (H) 04/29/2020 1442    Micro Results No results found for this or any previous visit (from the past 240 hour(s)).  Radiology Reports EEG  Result Date: 04/23/2020 Lora Havens, MD     04/23/2020  5:47 PM Patient Name: Ahmaud Briskey Wren MRN: [id number] Epilepsy Attending: Lora Havens Referring Physician/Provider: Dr Amie [place] Date: 04/23/2020 Duration: 21.32 mins Patient history: 50yo M with seizure like activity. EEG to evaluate for seizure. Level of alertness: Awake AEDs during EEG study: GBP, LEV Technical aspects: This EEG study was done with scalp electrodes positioned according to the 10-20 International system of electrode placement. Electrical activity was acquired at a sampling rate of '500Hz'$  and reviewed with a high frequency filter of '70Hz'$  and a low frequency filter of '1Hz'$ . EEG data were recorded continuously and digitally stored. Description: The posterior dominant rhythm consists of 8 Hz activity of moderate voltage (25-35 uV) seen predominantly in posterior head regions, symmetric and reactive to eye opening and eye closing. Hyperventilation and photic stimulation were not performed.   IMPRESSION: This  study is within normal limits. No seizures or epileptiform discharges were seen throughout the recording. Lora Havens   DG Chest 1 View  Result Date: 04/29/2020 CLINICAL DATA:  Syncope 2 EXAM: CHEST  1 VIEW COMPARISON:  04/23/2020 FINDINGS: Stable large cardiac silhouette. There is mild central venous congestion improved from prior. Low lung volumes. No pneumothorax. Large dialysis catheter noted. IMPRESSION: 1. Improvement in venous congestion pattern. 2. Cardiomegaly. Electronically Signed   By: Suzy Bouchard M.D.   On: 04/29/2020 15:16   CT Head Wo Contrast  Result Date: 05/11/2020 CLINICAL DATA:  Recent fall EXAM: CT HEAD WITHOUT CONTRAST TECHNIQUE: Contiguous axial images were obtained from the base of the skull through the vertex  without intravenous contrast. COMPARISON:  04/29/2020 FINDINGS: Brain: There is no acute intracranial hemorrhage, mass effect, or edema. Gray-white differentiation is preserved. There is no extra-axial fluid collection. Ventricles and sulci are within normal limits in size and configuration. Vascular: No hyperdense vessel. Minimal atherosclerotic calcification at the skull base. Skull: Calvarium is unremarkable. Sinuses/Orbits: No acute finding. Other: None. IMPRESSION: No evidence of acute intracranial injury. Electronically Signed   By: Macy Mis M.D.   On: 05/11/2020 11:24   CT Head Wo Contrast  Result Date: 04/29/2020 CLINICAL DATA:  Mental status change.  Syncope EXAM: CT HEAD WITHOUT CONTRAST TECHNIQUE: Contiguous axial images were obtained from the base of the skull through the vertex without intravenous contrast. COMPARISON:  CT head 04/23/2020 FINDINGS: Brain: No evidence of acute infarction, hemorrhage, hydrocephalus, extra-axial collection or mass lesion/mass effect. Vascular: Negative for hyperdense vessel Skull: Negative Sinuses/Orbits: Paranasal sinuses clear.  Negative orbit Other: Motion degraded study IMPRESSION: Negative CT head  Electronically Signed   By: Franchot Gallo M.D.   On: 04/29/2020 16:33   CT Head Wo Contrast  Result Date: 04/23/2020 CLINICAL DATA:  Altered mental status.  Hypertension. EXAM: CT HEAD WITHOUT CONTRAST TECHNIQUE: Contiguous axial images were obtained from the base of the skull through the vertex without intravenous contrast. COMPARISON:  April 12, 2020. FINDINGS: Brain: Ventricles and sulci are normal in size and configuration. There is no intracranial mass, hemorrhage, extra-axial fluid collection, midline shift. Brain parenchyma appears unremarkable. No acute infarct evident. Vascular: No hyperdense vessel. There is mild calcification in the right carotid siphon region. Skull: The bony calvarium appears intact. Sinuses/Orbits: Paranasal sinuses are clear. There is a degree of exophthalmos bilaterally. Orbits appear symmetric bilaterally. Other: Mastoid air cells are clear. IMPRESSION: 1.  Normal appearing brain parenchyma.  No mass or hemorrhage. 2. There is a degree of exophthalmos, symmetric. Correlation with thyroid function advised in this regard. 3.  Slight calcification in the right carotid siphon region. Electronically Signed   By: Lowella Grip III M.D.   On: 04/23/2020 09:53   CT Head Wo Contrast  Result Date: 04/12/2020 CLINICAL DATA:  50 year old male with a history mental status changes EXAM: CT HEAD WITHOUT CONTRAST TECHNIQUE: Contiguous axial images were obtained from the base of the skull through the vertex without intravenous contrast. COMPARISON:  07/08/2018 FINDINGS: Brain: No acute intracranial hemorrhage. No midline shift or mass effect. Gray-white differentiation maintained. Unremarkable appearance of the ventricular system. Vascular: Unremarkable. Skull: No acute fracture.  No aggressive bone lesion identified. 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 Unremarkable paranasal sinuses which  are clear. No mastoid effusion. Other: None IMPRESSION: Negative for acute intracranial abnormality. Interval development of bilateral exophthalmos, which can be seen with thyroid disease. Correlation with thyroid labs recommended. Electronically Signed   By: Corrie Mckusick D.O.   On: 04/12/2020 14:55   CT Cervical Spine Wo Contrast  Result Date: 05/11/2020 CLINICAL DATA:  Recent fall EXAM: CT CERVICAL SPINE WITHOUT CONTRAST TECHNIQUE: Multidetector CT imaging of the cervical spine was performed without intravenous contrast. Multiplanar CT image reconstructions were also generated. COMPARISON:  None. FINDINGS: There is artifact related to body habitus. Alignment: Anteroposterior alignment is preserved. Skull base and vertebrae: No acute cervical spine fracture. Vertebral body heights are maintained. Soft tissues and spinal canal: No prevertebral fluid or swelling. The spinal canal is obscured below C2-C3. Disc levels: Intervertebral disc heights are maintained. No degenerative stenosis. Upper chest: Chest dated  separately. Other: None. IMPRESSION: No acute cervical spine fracture. Electronically Signed   By: Macy Mis M.D.   On: 05/11/2020 11:22   CT T-SPINE NO CHARGE  Result Date: 05/11/2020 CLINICAL DATA:  Recent fall EXAM: CT THORACIC SPINE WITHOUT CONTRAST TECHNIQUE: Multidetector CT images of the thoracic were obtained using the standard protocol without intravenous contrast. COMPARISON:  None. FINDINGS: Artifact is present secondary to body habitus. Alignment: Preserved. Vertebrae: Vertebral body heights are maintained. There is soft tissue replacing a portion of the posterior T1 vertebral body with erosion of cortex and potential epidural extension. There is no acute fracture identified. Paraspinal and other soft tissues: Extra-spinal findings are better evaluated on concurrent dedicated imaging. Disc levels: Intervertebral disc heights are maintained. No significant osseous encroachment on the  spinal canal or neural foramina. IMPRESSION: No acute fracture. Soft tissue replacing part of the posterior T1 vertebral body with erosion of cortex and potential epidural extension. MRI is recommended for further evaluation. Electronically Signed   By: Macy Mis M.D.   On: 05/11/2020 11:39   DG Chest Port 1 View  Result Date: 05/11/2020 CLINICAL DATA:  50 year old male with chest pain while at dialysis. EXAM: PORTABLE CHEST 1 VIEW COMPARISON:  Portable chest 04/29/2020 and earlier. FINDINGS: Portable AP upright view at 0944 hours. Stable left chest AICD, right chest dialysis catheter. Mildly larger, improved lung volumes. Mild cardiomegaly. Other mediastinal contours are within normal limits. Visualized tracheal air column is within normal limits. No pneumothorax. Allowing for portable technique the lungs are clear. No acute osseous abnormality identified. IMPRESSION: No acute cardiopulmonary abnormality. Electronically Signed   By: Genevie Ann M.D.   On: 05/11/2020 10:02   DG Chest Portable 1 View  Result Date: 04/23/2020 CLINICAL DATA:  Altered mental status Hypertensive during dialysis Unresponsive EXAM: PORTABLE CHEST 1 VIEW COMPARISON:  04/12/2020 FINDINGS: Unchanged cardiomegaly. Scattered strandy lung opacities likely due to atelectasis. Dual lead left chest wall AICD unchanged in configuration. Dialysis catheter unchanged in position. IMPRESSION: Unchanged mild cardiomegaly. Electronically Signed   By: Miachel Roux M.D.   On: 04/23/2020 08:12   DG Chest Portable 1 View  Result Date: 04/12/2020 CLINICAL DATA:  Seizure activity, vomiting, history pacemaker, coronary artery disease, chronic systolic heart failure, diabetes mellitus, hypertension, ischemic cardiomyopathy, paroxysmal atrial fibrillation EXAM: PORTABLE CHEST 1 VIEW COMPARISON:  Portable exam 1345 hours compared to 01/23/2020 FINDINGS: RIGHT jugular dual-lumen central venous catheter with tips projecting over RIGHT atrium. LEFT  subclavian AICD with leads projecting over RIGHT atrium and RIGHT ventricle. Enlargement of cardiac silhouette with vascular congestion. Stable mediastinal contours. Decreased lung volumes without gross infiltrate, pleural effusion or pneumothorax. Osseous structures unremarkable. IMPRESSION: Enlargement of cardiac silhouette with pulmonary vascular congestion post ICD. No acute abnormalities. Electronically Signed   By: Lavonia Dana M.D.   On: 04/12/2020 13:57   CT Angio Chest/Abd/Pel for Dissection W and/or Wo Contrast  Result Date: 05/11/2020 CLINICAL DATA:  Chest and abdominal pain. EXAM: CT ANGIOGRAPHY CHEST, ABDOMEN AND PELVIS TECHNIQUE: Non-contrast CT of the chest was initially obtained. Multidetector CT imaging through the chest, abdomen and pelvis was performed using the standard protocol during bolus administration of intravenous contrast. Multiplanar reconstructed images and MIPs were obtained and reviewed to evaluate the vascular anatomy. CONTRAST:  142m OMNIPAQUE IOHEXOL 350 MG/ML SOLN COMPARISON:  July 08, 2018. FINDINGS: CTA CHEST FINDINGS Cardiovascular: Preferential opacification of the thoracic aorta. No evidence of thoracic aortic aneurysm or dissection. Mild cardiomegaly is noted. Coronary artery calcifications are noted. No pericardial  effusion. Mediastinum/Nodes: No enlarged mediastinal, hilar, or axillary lymph nodes. Thyroid gland, trachea, and esophagus demonstrate no significant findings. Lungs/Pleura: Lungs are clear. No pleural effusion or pneumothorax. Musculoskeletal: No chest wall abnormality. No acute or significant osseous findings. Review of the MIP images confirms the above findings. CTA ABDOMEN AND PELVIS FINDINGS VASCULAR Aorta: Atherosclerosis of abdominal aorta is noted without aneurysm or dissection. Celiac: Patent without evidence of aneurysm, dissection, vasculitis or significant stenosis. SMA: Patent without evidence of aneurysm, dissection, vasculitis or significant  stenosis. Renals: 2 renal arteries are noted on each side, and there is no evidence of significant stenosis or thrombosis present. IMA: Patent without evidence of aneurysm, dissection, vasculitis or significant stenosis. Inflow: Patent without evidence of aneurysm, dissection, vasculitis or significant stenosis. Veins: No obvious venous abnormality within the limitations of this arterial phase study. Review of the MIP images confirms the above findings. NON-VASCULAR Hepatobiliary: No focal liver abnormality is seen. No gallstones, gallbladder wall thickening, or biliary dilatation. Pancreas: Unremarkable. No pancreatic ductal dilatation or surrounding inflammatory changes. Spleen: Normal in size without focal abnormality. Adrenals/Urinary Tract: Stable right adrenal myelolipoma. Left adrenal gland is unremarkable. Bilateral renal atrophy is noted. Stable left renal cyst is noted. No hydronephrosis or renal obstruction is noted. No renal or ureteral calculi are noted. Urinary bladder is unremarkable. Stomach/Bowel: Stomach is within normal limits. Appendix appears normal. No evidence of bowel wall thickening, distention, or inflammatory changes. Lymphatic: No significant adenopathy is noted. Reproductive: Prostate is unremarkable. Other: No abdominal wall hernia or abnormality. No abdominopelvic ascites. Musculoskeletal: No acute or significant osseous findings. Review of the MIP images confirms the above findings. IMPRESSION: 1. No evidence of thoracic or abdominal aortic aneurysm or dissection. 2. Coronary artery calcifications are noted suggesting coronary artery disease. 3. Bilateral renal atrophy is noted. 4. Stable right adrenal myelolipoma. 5. No acute abnormality seen in the chest, abdomen or pelvis. 6. Aortic atherosclerosis. Aortic Atherosclerosis (ICD10-I70.0). Electronically Signed   By: Marijo Conception M.D.   On: 05/11/2020 11:28

## 2020-05-12 NOTE — Progress Notes (Signed)
   05/12/20 0144  Clinical Encounter Type  Visited With Patient not available  Visit Type Code  Referral From Nurse  Consult/Referral To Chaplain   Chaplain responded to page for Code Blue. Pt unavailable and no family present. No current need for chaplain. Chaplain remains available.   This note was prepared by Chaplain Resident, Dante Gang, MDiv. Chaplain remains available as needed through the on-call pager: [phone].

## 2020-05-13 DIAGNOSIS — I482 Chronic atrial fibrillation, unspecified: Secondary | ICD-10-CM | POA: Diagnosis not present

## 2020-05-13 LAB — GLUCOSE, CAPILLARY
Glucose-Capillary: 108 mg/dL — ABNORMAL HIGH (ref 70–99)
Glucose-Capillary: 156 mg/dL — ABNORMAL HIGH (ref 70–99)
Glucose-Capillary: 138 mg/dL — ABNORMAL HIGH (ref 70–99)
Glucose-Capillary: 195 mg/dL — ABNORMAL HIGH (ref 70–99)

## 2020-05-13 LAB — RENAL FUNCTION PANEL
Albumin: 2.6 g/dL — ABNORMAL LOW (ref 3.5–5.0)
Anion gap: 10 (ref 5–15)
BUN: 45 mg/dL — ABNORMAL HIGH (ref 6–20)
CO2: 21 mmol/L — ABNORMAL LOW (ref 22–32)
Calcium: 7.6 mg/dL — ABNORMAL LOW (ref 8.9–10.3)
Chloride: 107 mmol/L (ref 98–111)
Creatinine, Ser: 6.81 mg/dL — ABNORMAL HIGH (ref 0.61–1.24)
GFR, Estimated: 9 mL/min — ABNORMAL LOW (ref >60)
Glucose, Bld: 150 mg/dL — ABNORMAL HIGH (ref 70–99)
Phosphorus: 5.8 mg/dL — ABNORMAL HIGH (ref 2.5–4.6)
Potassium: 4.8 mmol/L (ref 3.5–5.1)
Sodium: 138 mmol/L (ref 135–145)

## 2020-05-13 LAB — CBC
HCT: 32.6 % — ABNORMAL LOW (ref 39.0–52.0)
Hemoglobin: 10.4 g/dL — ABNORMAL LOW (ref 13.0–17.0)
MCH: 29.1 pg (ref 26.0–34.0)
MCHC: 31.9 g/dL (ref 30.0–36.0)
MCV: 91.3 fL (ref 80.0–100.0)
Platelets: 216 10*3/uL (ref 150–400)
RBC: 3.57 MIL/uL — ABNORMAL LOW (ref 4.22–5.81)
RDW: 16.4 % — ABNORMAL HIGH (ref 11.5–15.5)
WBC: 6.7 10*3/uL (ref 4.0–10.5)
nRBC: 0 % (ref 0.0–0.2)

## 2020-05-13 LAB — HEMOGLOBIN A1C
Hgb A1c MFr Bld: 8.3 % — ABNORMAL HIGH (ref 4.8–5.6)
Mean Plasma Glucose: 191.51 mg/dL

## 2020-05-13 MED ORDER — HEPARIN SODIUM (PORCINE) 1000 UNIT/ML IJ SOLN
INTRAMUSCULAR | Status: AC
  Filled 2020-05-13: qty 5

## 2020-05-13 MED ORDER — TRAZODONE HCL 50 MG PO TABS
50.0000 mg | ORAL_TABLET | Freq: Every day | ORAL | Status: DC
  Administered 2020-05-14 – 2020-05-15 (×2): 50 mg via ORAL
  Filled 2020-05-13 (×2): qty 1

## 2020-05-13 NOTE — Progress Notes (Signed)
[facility] Progress Note  Subjective: seen in room, no c/o's today.   Vitals:   05/13/20 0756 05/13/20 0842 05/13/20 0917 05/13/20 1111  BP: 131/81   124/82  Pulse: 70   70  Resp:  20 20   Temp: (!) 97.5 F (36.4 C)   (!) 97.4 F (36.3 C)  TempSrc: Oral   Oral  SpO2: 99%   99%  Weight:        Exam: Gen alert, obese, WM, nad No jvd or bruits Chest clear bilat to bases no rales RRR no MRG Abd soft ntnd no mass or ascites +bs Ext 1+ L pretib edema, no other edema Neuro is alert, Ox 3, mild L arm weakness    Home meds:  - bumex 2 bid/ zetia 10/ sl ntg prn  - renvela 3 ac tid  - empagliflozin 10 qd/ insulin aspart 22u qam/ liraglutide 0.6 q wk  - remeron 7.5 hs  - alirocumab '150mg'$  q 14d  - prn's/ vitamins/ supplements    CXR - IMPRESSION: No acute cardiopulmonary abnormality.   OP HD: [name] [facility] [id number] - no answer when called Sat  Assessment/ Plan: 1. L sided weakness - per pmd, unclear cause. CT showing possible T1 mass. NSurg consulted and plan is for MRI brain/ spine with and w/o contrast to be done Monday. Should have HD after MRI contrast the same day and then ideally will need another HD session the next day (Tuesday) to complete removal of the contrast.  2. ESRD - on HD [name]. Missed Wed HD last week. Labs okay, no signs uremia. HD yesterday postponed to today, will have it this afternoon. Then HD tomorrow back on schedule and to be done post MRI. Get records from HD unit on Monday.  3. BP/ volume - no vol excess on exam, CXR clear. BP's wnl, not on BP lowering meds at home. UF 1- 2 L as tolerated.  4. Chron atrial fib - on eliquis at home and here 5. CAD sp PCI/ stenting - extensive hx of CAD/ PCI's. Most recent PCI Mar 27 2020 to LAD/ RCA. On Brilinta, statin, BB.  6. Chron syst CHF/ sp AICD 7. Depression 8. 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 allopurinol  200 mg Oral Daily  . amiodarone  200 mg Oral BID  . amLODipine  10 mg Oral Daily  . apixaban  5 mg Oral BID  . atorvastatin  80 mg Oral QPM  . Chlorhexidine Gluconate Cloth  6 each Topical Daily  . Chlorhexidine Gluconate Cloth  6 each Topical Q0600  . FLUoxetine  80 mg Oral Daily  . gabapentin  100 mg Oral BID  . insulin aspart  0-5 Units Subcutaneous QHS  . insulin aspart  0-6 Units Subcutaneous TID WC  . insulin detemir  10 Units Subcutaneous QHS  . isosorbide mononitrate  60 mg Oral Once per day on Sun Tue Thu Sat  .  metoprolol succinate  100 mg Oral Daily  . pantoprazole  40 mg Oral Daily  . ranolazine  500 mg Oral BID  . sevelamer carbonate  1,600 mg Oral TID WC  . ticagrelor  90 mg Oral BID  . traZODone  50 mg Oral Daily    acetaminophen **OR** acetaminophen, albuterol, calcium carbonate (dosed in mg elemental calcium), feeding supplement (NEPRO CARB STEADY), labetalol, ondansetron **OR** ondansetron (ZOFRAN) IV, oxyCODONE-acetaminophen, sorbitol

## 2020-05-13 NOTE — Progress Notes (Signed)
PROGRESS NOTE                                                                                                                                                                                                             Patient Demographics:    Edwin [name], is a 50 y.o. male, DOB - [birth year], [id number]  Outpatient Primary MD for the patient is Caryl Never, MD   Admit date - 05/11/2020   LOS - 1  No chief complaint on file.      Brief Narrative: Patient is a 50 y.o. male with PMHx of ESRD on HD [name], chronic systolic heart failure, PAF on Eliquis, CAD-numerous PCI's-most recently in December 2021, s/p permanent pacemaker implantation, pseudoseizures, recent Covid positive test on 1/24-presented to [facility] on 2/11 with intermittent chest pain, and subtle left-sided weakness.  CT T-spine showed a T1 vertebral body mass-transfered to [facility] for PPM compatible MRI imaging of the spine.  COVID-19 vaccinated status:  Significant Events: 2/11>> evaluated at [facility] ED for chest pain-left-sided weakness-found to have T1 mass on CT scan T-spine 2/11>> transferred to [facility] for PPM compatible MRI imaging of spine. 2/12>> unresponsive-CODE BLUE-briefly transferred to ICU. Never lost pulse 2/12>>stable-transfer back to telemetry  Significant studies: 2/11>> CT angio chest/abdomen: No dissection, stable right adrenal myolipoma 2/11>> CT head: No acute intracranial abnormality 2/11>> CT C-spine: No acute C-spine fracture 2/11>> CT T-spine: Soft tissue replacing post T1 body with erosion of the cortex/epidural extension  2/11>> chest x-ray: No acute cardiopulmonary abnormality  COVID-19 medications: None  Antibiotics: None  Microbiology data: None  Procedures: None  Consults: Neurosurgery, nephrology, PCCM  DVT prophylaxis: apixaban (ELIQUIS) tablet 5 mg     Subjective:   Patient in bed, appears comfortable, denies any  headache, no fever, no chest pain or pressure, no shortness of breath , no abdominal pain.  +ve mild left leg more than arm weakness and some slurring in speech which has been present for a week.    Assessment  & Plan :   Left-sided weakness: Unclear etiology-CT imaging positive for T1 mass.  Neurosurgery has ordered MRI brain along with C-spine and T-spine with contrast (neurosurgery aware of ESRD-see note)-still pending.  MRI will be done as soon as feasible, currently some staffing restraints, continue Eliquis and statin for secondary prevention.  Already on maximal treatment  at this time.  Lab Results  Component Value Date   HGBA1C 8.1 (H) 04/12/2020   Lab Results  Component Value Date   CHOL 100 04/13/2020   HDL 44 04/13/2020   LDLCALC 33 04/13/2020   TRIG 116 04/13/2020   CHOLHDL 2.3 04/13/2020    T1-spine mass noted on CT scan.  Neurosurgery following.  MRI pending.  Chest pain: Felt to be atypical-troponins not significantly elevated.  Most recent PCI December 2021-see below  Pseudoseizures/unresponsive episode: See note from Dr. Vanetta Shawl had prior extensive evaluation in the past.  Dr. Myna Hidalgo discussed with neurology on-call-Dr. Bunnie Philips reviewed case-and did not feel any further work-up/intervention was needed.  ESRD on HD [name]: Nephrology is following dialyzed per availability of machines and staff  Anemia: Related to ESRD-no indication for transfusion-follow  PAF: Continue amiodarone,metoprolol-resume Eliquis.  Chronic systolic/diastolic heart failure: Volume status stable-diuresis with HD.  HTN: BP is significantly elevated-resuming amlodipine, Imdur and metoprolol.  CAD: Has extensive history of CAD requiring numerous PCI's--most recent PCI with balloon angioplasty to LAD/RCA on 12/28.  Remains on Brilinta/statin/metoprolol.  Followed by [facility] cardiology.  Peripheral neuropathy: Continue Neurontin  History of gout: Continue allopurinol  History of  anxiety/depression: Continue trazodone/Prozac  DM-2 (A1c 8.1 on 1/13): CBG's stable-continue Levemir 10 units QHS and SSI-hold all oral hypoglycemics.    Recent Labs    05/12/20 1604 05/12/20 2113 05/13/20 0606  GLUCAP 164* 127* 108*    Recent COVID-19 infection: Currently asymptomatic-on room air during my evaluation-is being on 10 days from his last positive test.  Does not require any further isolation.     Lab Results  Component Value Date   SARSCOV2NAA POSITIVE (A) 04/23/2020   [place] NEGATIVE 04/12/2020   [place] NEGATIVE 01/23/2020   [place] NEGATIVE 01/14/2020     Obesity: Estimated body mass index is 44.55 kg/m as calculated from the following:   Height as of an earlier encounter on 05/11/20: '6\' 2"'$  (1.88 m).   Weight as of this encounter: 157.4 kg.    GI prophylaxis: PPI  Family Communication  :  Spouse - [name]-[phone] updated 05/13/20.  Code Status :  Full Code  Diet :  Diet Order            Diet renal with fluid restriction Fluid restriction: 1200 mL Fluid; Room service appropriate? Yes; Fluid consistency: Thin  Diet effective now                  Disposition Plan  :   Status is: INP  The patient will require care spanning > 2 midnights and should be moved to inpatient because: Inpatient level of care appropriate due to severity of illness  Dispo: The patient is from: Home              Anticipated d/c is to: TBD              Anticipated d/c date is: > 3 days              Patient currently is not medically stable to d/c.   Difficult to place patient No   Barriers to discharge: T1 mass-awaiting further work up  Antimicorbials  :    Anti-infectives (From admission, onward)   None      Inpatient Medications  Scheduled Meds: . allopurinol  200 mg Oral Daily  . amiodarone  200 mg Oral BID  . amLODipine  10 mg Oral Daily  . apixaban  5 mg Oral BID  .  atorvastatin  80 mg Oral QPM  . Chlorhexidine Gluconate Cloth  6 each  Topical Daily  . Chlorhexidine Gluconate Cloth  6 each Topical Q0600  . FLUoxetine  80 mg Oral Daily  . gabapentin  100 mg Oral BID  . insulin aspart  0-5 Units Subcutaneous QHS  . insulin aspart  0-6 Units Subcutaneous TID WC  . insulin detemir  10 Units Subcutaneous QHS  . isosorbide mononitrate  60 mg Oral Once per day on Sun Tue Thu Sat  . metoprolol succinate  100 mg Oral Daily  . pantoprazole  40 mg Oral Daily  . ranolazine  500 mg Oral BID  . sevelamer carbonate  1,600 mg Oral TID WC  . ticagrelor  90 mg Oral BID  . traZODone  50 mg Oral Daily   Continuous Infusions: PRN Meds:.acetaminophen **OR** acetaminophen, albuterol, calcium carbonate (dosed in mg elemental calcium), feeding supplement (NEPRO CARB STEADY), labetalol, ondansetron **OR** ondansetron (ZOFRAN) IV, oxyCODONE-acetaminophen, sorbitol   Time Spent in minutes  25  See all Orders from today for further details   Lala Lund M.D on 05/13/2020 at 8:34 AM  To page go to [email or web address] - use universal password  [facility] -  Office  [phone]    Objective:   Vitals:   05/12/20 2230 05/13/20 0115 05/13/20 0324 05/13/20 0756  BP:  139/81 128/69 131/81  Pulse: 71 70 70 70  Resp: '20 19 18   '$ Temp:  98 F (36.7 C) 98.2 F (36.8 C) (!) 97.5 F (36.4 C)  TempSrc:  Oral Oral Oral  SpO2:  98% 100% 99%  Weight:   (!) 157.4 kg     Wt Readings from Last 3 Encounters:  05/13/20 (!) 157.4 kg  05/11/20 (!) 158 kg  05/01/20 (!) 156.2 kg     Intake/Output Summary (Last 24 hours) at 05/13/2020 0834 Last data filed at 05/13/2020 0500 Gross per 24 hour  Intake 800 ml  Output 550 ml  Net 250 ml     Physical Exam  Awake Alert, mild left-sided facial droop and minimal left-sided weakness leg more than arm, .AT,PERRAL Supple Neck,No JVD, No cervical lymphadenopathy appriciated.  Symmetrical Chest wall movement, Good air movement bilaterally, CTAB RRR,No Gallops, Rubs or new Murmurs, No  Parasternal Heave +ve B.Sounds, Abd Soft, No tenderness, No organomegaly appriciated, No rebound - guarding or rigidity. No Cyanosis, Clubbing or edema, No new Rash or bruise    Data Review:    CBC Recent Labs  Lab 05/11/20 0909 05/12/20 0114 05/12/20 0203 05/12/20 0218 05/13/20 0334  WBC 9.8 9.7 9.1  --  6.7  HGB 11.1* 11.4* 10.8* 11.6* 10.4*  HCT 34.9* 37.9* 35.8* 34.0* 32.6*  PLT 218 218 217  --  216  MCV 89.3 92.2 92.0  --  91.3  MCH 28.4 27.7 27.8  --  29.1  MCHC 31.8 30.1 30.2  --  31.9  RDW 16.0* 16.1* 16.0*  --  16.4*  LYMPHSABS 1.0  --  1.0  --   --   MONOABS 0.7  --  0.7  --   --   EOSABS 0.1  --  0.1  --   --   BASOSABS 0.0  --  0.0  --   --     Chemistries  Recent Labs  Lab 05/11/20 0909 05/12/20 0114 05/12/20 0203 05/12/20 0218 05/13/20 0334  NA 137  --  137 137 138  K 4.7  --  5.1 5.0 4.8  CL 106  --  106  --  107  CO2 20*  --  21*  --  21*  GLUCOSE 161*  --  160*  --  150*  BUN 39*  --  38*  --  45*  CREATININE 5.36* 6.17* 6.27*  --  6.81*  CALCIUM 7.6*  --  7.7*  --  7.6*  MG  --   --  1.8  --   --   AST 13*  --  11*  --   --   ALT 9  --  11  --   --   ALKPHOS 76  --  89  --   --   BILITOT 0.5  --  0.9  --   --    ------------------------------------------------------------------------------------------------------------------ No results for input(s): CHOL, HDL, LDLCALC, TRIG, CHOLHDL, LDLDIRECT in the last 72 hours.  Lab Results  Component Value Date   HGBA1C 8.1 (H) 04/12/2020   ------------------------------------------------------------------------------------------------------------------ Recent Labs    05/12/20 0200  TSH 5.446*   ------------------------------------------------------------------------------------------------------------------ No results for input(s): VITAMINB12, FOLATE, FERRITIN, TIBC, IRON, RETICCTPCT in the last 72 hours.  Coagulation profile Recent Labs  Lab 05/11/20 0909  INR 1.2    No results for  input(s): DDIMER in the last 72 hours.  Cardiac Enzymes No results for input(s): CKMB, TROPONINI, MYOGLOBIN in the last 168 hours.  Invalid input(s): CK ------------------------------------------------------------------------------------------------------------------    Component Value Date/Time   BNP 574.2 (H) 04/29/2020 1442    Micro Results No results found for this or any previous visit (from the past 240 hour(s)).  Radiology Reports EEG  Result Date: 04/23/2020 Lora Havens, MD     04/23/2020  5:47 PM Patient Name: Naseem Stephan Mowatt MRN: [id number] Epilepsy Attending: Lora Havens Referring Physician/Provider: Dr Amie [place] Date: 04/23/2020 Duration: 21.32 mins Patient history: 50yo M with seizure like activity. EEG to evaluate for seizure. Level of alertness: Awake AEDs during EEG study: GBP, LEV Technical aspects: This EEG study was done with scalp electrodes positioned according to the 10-20 International system of electrode placement. Electrical activity was acquired at a sampling rate of '500Hz'$  and reviewed with a high frequency filter of '70Hz'$  and a low frequency filter of '1Hz'$ . EEG data were recorded continuously and digitally stored. Description: The posterior dominant rhythm consists of 8 Hz activity of moderate voltage (25-35 uV) seen predominantly in posterior head regions, symmetric and reactive to eye opening and eye closing. Hyperventilation and photic stimulation were not performed.   IMPRESSION: This study is within normal limits. No seizures or epileptiform discharges were seen throughout the recording. Lora Havens   DG Chest 1 View  Result Date: 04/29/2020 CLINICAL DATA:  Syncope 2 EXAM: CHEST  1 VIEW COMPARISON:  04/23/2020 FINDINGS: Stable large cardiac silhouette. There is mild central venous congestion improved from prior. Low lung volumes. No pneumothorax. Large dialysis catheter noted. IMPRESSION: 1. Improvement in venous congestion pattern. 2. Cardiomegaly.  Electronically Signed   By: Suzy Bouchard M.D.   On: 04/29/2020 15:16   CT Head Wo Contrast  Result Date: 05/11/2020 CLINICAL DATA:  Recent fall EXAM: CT HEAD WITHOUT CONTRAST TECHNIQUE: Contiguous axial images were obtained from the base of the skull through the vertex without intravenous contrast. COMPARISON:  04/29/2020 FINDINGS: Brain: There is no acute intracranial hemorrhage, mass effect, or edema. Gray-white differentiation is preserved. There is no extra-axial fluid collection. Ventricles and sulci are within normal limits in size and configuration. Vascular: No hyperdense vessel. Minimal atherosclerotic calcification at the skull base. Skull: Calvarium  is unremarkable. Sinuses/Orbits: No acute finding. Other: None. IMPRESSION: No evidence of acute intracranial injury. Electronically Signed   By: Macy Mis M.D.   On: 05/11/2020 11:24   CT Head Wo Contrast  Result Date: 04/29/2020 CLINICAL DATA:  Mental status change.  Syncope EXAM: CT HEAD WITHOUT CONTRAST TECHNIQUE: Contiguous axial images were obtained from the base of the skull through the vertex without intravenous contrast. COMPARISON:  CT head 04/23/2020 FINDINGS: Brain: No evidence of acute infarction, hemorrhage, hydrocephalus, extra-axial collection or mass lesion/mass effect. Vascular: Negative for hyperdense vessel Skull: Negative Sinuses/Orbits: Paranasal sinuses clear.  Negative orbit Other: Motion degraded study IMPRESSION: Negative CT head Electronically Signed   By: Franchot Gallo M.D.   On: 04/29/2020 16:33   CT Head Wo Contrast  Result Date: 04/23/2020 CLINICAL DATA:  Altered mental status.  Hypertension. EXAM: CT HEAD WITHOUT CONTRAST TECHNIQUE: Contiguous axial images were obtained from the base of the skull through the vertex without intravenous contrast. COMPARISON:  April 12, 2020. FINDINGS: Brain: Ventricles and sulci are normal in size and configuration. There is no intracranial mass, hemorrhage, extra-axial  fluid collection, midline shift. Brain parenchyma appears unremarkable. No acute infarct evident. Vascular: No hyperdense vessel. There is mild calcification in the right carotid siphon region. Skull: The bony calvarium appears intact. Sinuses/Orbits: Paranasal sinuses are clear. There is a degree of exophthalmos bilaterally. Orbits appear symmetric bilaterally. Other: Mastoid air cells are clear. IMPRESSION: 1.  Normal appearing brain parenchyma.  No mass or hemorrhage. 2. There is a degree of exophthalmos, symmetric. Correlation with thyroid function advised in this regard. 3.  Slight calcification in the right carotid siphon region. Electronically Signed   By: Lowella Grip III M.D.   On: 04/23/2020 09:53   CT Cervical Spine Wo Contrast  Result Date: 05/11/2020 CLINICAL DATA:  Recent fall EXAM: CT CERVICAL SPINE WITHOUT CONTRAST TECHNIQUE: Multidetector CT imaging of the cervical spine was performed without intravenous contrast. Multiplanar CT image reconstructions were also generated. COMPARISON:  None. FINDINGS: There is artifact related to body habitus. Alignment: Anteroposterior alignment is preserved. Skull base and vertebrae: No acute cervical spine fracture. Vertebral body heights are maintained. Soft tissues and spinal canal: No prevertebral fluid or swelling. The spinal canal is obscured below C2-C3. Disc levels: Intervertebral disc heights are maintained. No degenerative stenosis. Upper chest: Chest dated separately. Other: None. IMPRESSION: No acute cervical spine fracture. Electronically Signed   By: Macy Mis M.D.   On: 05/11/2020 11:22   CT T-SPINE NO CHARGE  Result Date: 05/11/2020 CLINICAL DATA:  Recent fall EXAM: CT THORACIC SPINE WITHOUT CONTRAST TECHNIQUE: Multidetector CT images of the thoracic were obtained using the standard protocol without intravenous contrast. COMPARISON:  None. FINDINGS: Artifact is present secondary to body habitus. Alignment: Preserved. Vertebrae:  Vertebral body heights are maintained. There is soft tissue replacing a portion of the posterior T1 vertebral body with erosion of cortex and potential epidural extension. There is no acute fracture identified. Paraspinal and other soft tissues: Extra-spinal findings are better evaluated on concurrent dedicated imaging. Disc levels: Intervertebral disc heights are maintained. No significant osseous encroachment on the spinal canal or neural foramina. IMPRESSION: No acute fracture. Soft tissue replacing part of the posterior T1 vertebral body with erosion of cortex and potential epidural extension. MRI is recommended for further evaluation. Electronically Signed   By: Macy Mis M.D.   On: 05/11/2020 11:39   DG Chest Port 1 View  Result Date: 05/11/2020 CLINICAL DATA:  50 year old male with chest  pain while at dialysis. EXAM: PORTABLE CHEST 1 VIEW COMPARISON:  Portable chest 04/29/2020 and earlier. FINDINGS: Portable AP upright view at 0944 hours. Stable left chest AICD, right chest dialysis catheter. Mildly larger, improved lung volumes. Mild cardiomegaly. Other mediastinal contours are within normal limits. Visualized tracheal air column is within normal limits. No pneumothorax. Allowing for portable technique the lungs are clear. No acute osseous abnormality identified. IMPRESSION: No acute cardiopulmonary abnormality. Electronically Signed   By: Genevie Ann M.D.   On: 05/11/2020 10:02   DG Chest Portable 1 View  Result Date: 04/23/2020 CLINICAL DATA:  Altered mental status Hypertensive during dialysis Unresponsive EXAM: PORTABLE CHEST 1 VIEW COMPARISON:  04/12/2020 FINDINGS: Unchanged cardiomegaly. Scattered strandy lung opacities likely due to atelectasis. Dual lead left chest wall AICD unchanged in configuration. Dialysis catheter unchanged in position. IMPRESSION: Unchanged mild cardiomegaly. Electronically Signed   By: Miachel Roux M.D.   On: 04/23/2020 08:12   CT Angio Chest/Abd/Pel for Dissection  W and/or Wo Contrast  Result Date: 05/11/2020 CLINICAL DATA:  Chest and abdominal pain. EXAM: CT ANGIOGRAPHY CHEST, ABDOMEN AND PELVIS TECHNIQUE: Non-contrast CT of the chest was initially obtained. Multidetector CT imaging through the chest, abdomen and pelvis was performed using the standard protocol during bolus administration of intravenous contrast. Multiplanar reconstructed images and MIPs were obtained and reviewed to evaluate the vascular anatomy. CONTRAST:  135m OMNIPAQUE IOHEXOL 350 MG/ML SOLN COMPARISON:  July 08, 2018. FINDINGS: CTA CHEST FINDINGS Cardiovascular: Preferential opacification of the thoracic aorta. No evidence of thoracic aortic aneurysm or dissection. Mild cardiomegaly is noted. Coronary artery calcifications are noted. No pericardial effusion. Mediastinum/Nodes: No enlarged mediastinal, hilar, or axillary lymph nodes. Thyroid gland, trachea, and esophagus demonstrate no significant findings. Lungs/Pleura: Lungs are clear. No pleural effusion or pneumothorax. Musculoskeletal: No chest wall abnormality. No acute or significant osseous findings. Review of the MIP images confirms the above findings. CTA ABDOMEN AND PELVIS FINDINGS VASCULAR Aorta: Atherosclerosis of abdominal aorta is noted without aneurysm or dissection. Celiac: Patent without evidence of aneurysm, dissection, vasculitis or significant stenosis. SMA: Patent without evidence of aneurysm, dissection, vasculitis or significant stenosis. Renals: 2 renal arteries are noted on each side, and there is no evidence of significant stenosis or thrombosis present. IMA: Patent without evidence of aneurysm, dissection, vasculitis or significant stenosis. Inflow: Patent without evidence of aneurysm, dissection, vasculitis or significant stenosis. Veins: No obvious venous abnormality within the limitations of this arterial phase study. Review of the MIP images confirms the above findings. NON-VASCULAR Hepatobiliary: No focal liver  abnormality is seen. No gallstones, gallbladder wall thickening, or biliary dilatation. Pancreas: Unremarkable. No pancreatic ductal dilatation or surrounding inflammatory changes. Spleen: Normal in size without focal abnormality. Adrenals/Urinary Tract: Stable right adrenal myelolipoma. Left adrenal gland is unremarkable. Bilateral renal atrophy is noted. Stable left renal cyst is noted. No hydronephrosis or renal obstruction is noted. No renal or ureteral calculi are noted. Urinary bladder is unremarkable. Stomach/Bowel: Stomach is within normal limits. Appendix appears normal. No evidence of bowel wall thickening, distention, or inflammatory changes. Lymphatic: No significant adenopathy is noted. Reproductive: Prostate is unremarkable. Other: No abdominal wall hernia or abnormality. No abdominopelvic ascites. Musculoskeletal: No acute or significant osseous findings. Review of the MIP images confirms the above findings. IMPRESSION: 1. No evidence of thoracic or abdominal aortic aneurysm or dissection. 2. Coronary artery calcifications are noted suggesting coronary artery disease. 3. Bilateral renal atrophy is noted. 4. Stable right adrenal myelolipoma. 5. No acute abnormality seen in the chest, abdomen or  pelvis. 6. Aortic atherosclerosis. Aortic Atherosclerosis (ICD10-I70.0). Electronically Signed   By: Marijo Conception M.D.   On: 05/11/2020 11:28

## 2020-05-13 NOTE — Progress Notes (Signed)
Pt placed himself on the CPAP for tonight

## 2020-05-14 ENCOUNTER — Inpatient Hospital Stay (HOSPITAL_COMMUNITY): Payer: Medicare Other

## 2020-05-14 DIAGNOSIS — I482 Chronic atrial fibrillation, unspecified: Secondary | ICD-10-CM | POA: Diagnosis not present

## 2020-05-14 DIAGNOSIS — R2990 Unspecified symptoms and signs involving the nervous system: Secondary | ICD-10-CM | POA: Diagnosis not present

## 2020-05-14 DIAGNOSIS — R404 Transient alteration of awareness: Secondary | ICD-10-CM | POA: Diagnosis not present

## 2020-05-14 LAB — RENAL FUNCTION PANEL
Albumin: 2.9 g/dL — ABNORMAL LOW (ref 3.5–5.0)
Anion gap: 12 (ref 5–15)
BUN: 38 mg/dL — ABNORMAL HIGH (ref 6–20)
CO2: 21 mmol/L — ABNORMAL LOW (ref 22–32)
Calcium: 7.5 mg/dL — ABNORMAL LOW (ref 8.9–10.3)
Chloride: 103 mmol/L (ref 98–111)
Creatinine, Ser: 6.38 mg/dL — ABNORMAL HIGH (ref 0.61–1.24)
GFR, Estimated: 10 mL/min — ABNORMAL LOW (ref >60)
Glucose, Bld: 129 mg/dL — ABNORMAL HIGH (ref 70–99)
Phosphorus: 5.4 mg/dL — ABNORMAL HIGH (ref 2.5–4.6)
Potassium: 4.7 mmol/L (ref 3.5–5.1)
Sodium: 136 mmol/L (ref 135–145)

## 2020-05-14 LAB — CBC
HCT: 35.9 % — ABNORMAL LOW (ref 39.0–52.0)
Hemoglobin: 11.4 g/dL — ABNORMAL LOW (ref 13.0–17.0)
MCH: 29 pg (ref 26.0–34.0)
MCHC: 31.8 g/dL (ref 30.0–36.0)
MCV: 91.3 fL (ref 80.0–100.0)
Platelets: 225 10*3/uL (ref 150–400)
RBC: 3.93 MIL/uL — ABNORMAL LOW (ref 4.22–5.81)
RDW: 16.3 % — ABNORMAL HIGH (ref 11.5–15.5)
WBC: 6.3 10*3/uL (ref 4.0–10.5)
nRBC: 0 % (ref 0.0–0.2)

## 2020-05-14 LAB — LIPID PANEL
Cholesterol: 220 mg/dL — ABNORMAL HIGH (ref 0–200)
HDL: 35 mg/dL — ABNORMAL LOW (ref >40)
LDL Cholesterol: 148 mg/dL — ABNORMAL HIGH (ref 0–99)
Total CHOL/HDL Ratio: 6.3 RATIO
Triglycerides: 183 mg/dL — ABNORMAL HIGH (ref <150)
VLDL: 37 mg/dL (ref 0–40)

## 2020-05-14 LAB — GLUCOSE, CAPILLARY
Glucose-Capillary: 123 mg/dL — ABNORMAL HIGH (ref 70–99)
Glucose-Capillary: 166 mg/dL — ABNORMAL HIGH (ref 70–99)
Glucose-Capillary: 197 mg/dL — ABNORMAL HIGH (ref 70–99)
Glucose-Capillary: 132 mg/dL — ABNORMAL HIGH (ref 70–99)
Glucose-Capillary: 104 mg/dL — ABNORMAL HIGH (ref 70–99)
Glucose-Capillary: 101 mg/dL — ABNORMAL HIGH (ref 70–99)

## 2020-05-14 LAB — CALCIUM, IONIZED: Calcium, Ionized, Serum: 4 mg/dL — ABNORMAL LOW (ref 4.5–5.6)

## 2020-05-14 MED ORDER — IOHEXOL 350 MG/ML SOLN
65.0000 mL | Freq: Once | INTRAVENOUS | Status: AC | PRN
  Administered 2020-05-14: 65 mL via INTRAVENOUS

## 2020-05-14 MED ORDER — SODIUM CHLORIDE 0.9 % IV SOLN: INTRAVENOUS | Status: DC

## 2020-05-14 MED ORDER — OXYCODONE HCL 5 MG PO TABS
5.0000 mg | ORAL_TABLET | Freq: Once | ORAL | Status: AC | PRN
Start: 2020-05-14 — End: 2020-05-14
  Administered 2020-05-14: 5 mg via ORAL
  Filled 2020-05-14: qty 1

## 2020-05-14 MED ORDER — SALINE SPRAY 0.65 % NA SOLN
1.0000 | NASAL | Status: DC | PRN
  Filled 2020-05-14: qty 44

## 2020-05-14 MED ORDER — OXYCODONE-ACETAMINOPHEN 5-325 MG PO TABS: 1.0000 | ORAL_TABLET | Freq: Four times a day (QID) | ORAL | Status: DC | PRN

## 2020-05-14 MED ORDER — LORAZEPAM 2 MG/ML IJ SOLN
INTRAMUSCULAR | Status: AC
  Filled 2020-05-14: qty 1

## 2020-05-14 MED ORDER — IOHEXOL 300 MG/ML  SOLN
100.0000 mL | Freq: Once | INTRAMUSCULAR | Status: AC | PRN
  Administered 2020-05-14: 100 mL via INTRAVENOUS

## 2020-05-14 NOTE — Progress Notes (Signed)
Wife called this RN for update. This RN updated wife to best of ability. Wife began to scream, cry and yell at this RN for being unable to give test results. Attempted to provided emotional support to wife, she was not accepting. Will update MD.

## 2020-05-14 NOTE — Progress Notes (Signed)
Sitting up on side of bed. Crying and c/o pain 10/10 mid/low back. Describes as stabbing pain. Notified MD on call, Dr Myna Hidalgo. See new order.

## 2020-05-14 NOTE — Progress Notes (Signed)
NEUROSURGERY PROGRESS NOTE  Note patient does not in fact have an MRI compatible pacemaker. CT T spine with contrast ordered by admitting. CT reviewed. The previously seen abnormality at T1 is not evident on contrasted study and was likely artifact. Will sign off. Please call for any concerns.

## 2020-05-14 NOTE — Progress Notes (Addendum)
PROGRESS NOTE                                                                                                                                                                                                             Patient Demographics:    Edwin [name], is a 50 y.o. male, DOB - [birth year], [id number]  Outpatient Primary MD for the patient is Caryl Never, MD   Admit date - 05/11/2020   LOS - 2  No chief complaint on file.      Brief Narrative:  Patient is a 50 y.o. male with PMHx of ESRD on HD [name], chronic systolic heart failure, PAF on Eliquis, CAD-numerous PCI's-most recently in December 2021, s/p permanent pacemaker implantation, pseudoseizures, recent Covid positive test on 1/24-presented to [facility] on 2/11 with intermittent chest pain, and subtle left-sided weakness.  CT T-spine showed a T1 vertebral body mass-transfered to [facility] for PPM compatible MRI imaging of the spine.  COVID-19 vaccinated status:  Significant Events: 2/11>> evaluated at [facility] ED for chest pain-left-sided weakness-found to have T1 mass on CT scan T-spine 2/11>> transferred to [facility] for PPM compatible MRI imaging of spine. 2/12>> unresponsive-CODE BLUE-briefly transferred to ICU. Never lost pulse 2/12>>stable-transfer back to telemetry  Significant studies: 2/11>> CT angio chest/abdomen: No dissection, stable right adrenal myolipoma 2/11>> CT head: No acute intracranial abnormality 2/11>> CT C-spine: No acute C-spine fracture 2/11>> CT T-spine: Soft tissue replacing post T1 body with erosion of the cortex/epidural extension  2/11>> chest x-ray: No acute cardiopulmonary abnormality 2/1 4 >> MRI -   COVID-19 medications: None  Antibiotics: None  Microbiology data: None  Procedures: None  Consults: Neurosurgery, nephrology, PCCM, Neuro  DVT prophylaxis: apixaban (ELIQUIS) tablet 5 mg     Subjective:   Sitting in recliner eating  breakfast, mild facial droop on the left side with some left-sided weakness, left leg more than arm weakness, ongoing upper back pain however looks to be in no distress.   Assessment  & Plan :   Left-sided weakness: Unclear etiology-CT imaging positive for T1 mass.  Neurosurgery has ordered MRI brain along with C-spine and T-spine with contrast neurosurgery and neurology have been consulted, MRI was pending however after thorough investigation it has been found by the EP team that patient has a few abandoned leads from a previous pacemaker hence he is  not MRI safe.  Will repeat CT scan of the head and T-spine, continue Eliquis and statin for secondary prevention.  Already on maximal treatment at this time.  Lab Results  Component Value Date   HGBA1C 8.3 (H) 05/13/2020   Lab Results  Component Value Date   CHOL 220 (H) 05/14/2020   HDL 35 (L) 05/14/2020   LDLCALC 148 (H) 05/14/2020   TRIG 183 (H) 05/14/2020   CHOLHDL 6.3 05/14/2020    T1-spine mass noted on CT scan.  Neurosurgery following. See above.  Chest pain: Felt to be atypical-troponins not significantly elevated.  Most recent PCI December 2021-see below  Pseudoseizures/unresponsive episode:   1. See note from Dr. Myna Hidalgo fron 05/12/20- became unresponsive and woke up after 1 chest compression, chart reviewed - has had prior extensive evaluation in the past.  Dr. Myna Hidalgo discussed with neurology on-call-Dr. Bronson Curb reviewed case-and did not feel any further work-up/intervention was needed.  EEG nonacute done January of this year.  2. Similar episode 05/15/19 2.50 pm - Neuro seeing the patient CTA head - ? Pesudosizure.    ESRD on HD [name]: Nephrology is following dialyzed per availability of machines and staff  Anemia: Related to ESRD-no indication for transfusion-follow  PAF: Continue amiodarone,metoprolol-resume Eliquis.  Chronic systolic/diastolic heart failure: Volume status stable-diuresis with HD.  HTN: BP is significantly  elevated-resuming amlodipine, Imdur and metoprolol.  CAD: Has extensive history of CAD requiring numerous PCI's--most recent PCI with balloon angioplasty to LAD/RCA on 12/28.  Remains on Brilinta/statin/metoprolol.  Followed by [facility] cardiology.  Peripheral neuropathy: Continue Neurontin  History of gout: Continue allopurinol  History of anxiety/depression: Continue trazodone/Prozac  DM-2 (A1c 8.1 on 1/13): CBG's stable-continue Levemir 10 units QHS and SSI-hold all oral hypoglycemics.    Recent Labs    05/12/20 1604 05/12/20 2113 05/13/20 0606  GLUCAP 164* 127* 108*    Recent COVID-19 infection: Currently asymptomatic-on room air during my evaluation-is being on 10 days from his last positive test.  Does not require any further isolation.     Lab Results  Component Value Date   [place] (A) 04/23/2020   [place] NEGATIVE 04/12/2020   [place] NEGATIVE 01/23/2020   [place] NEGATIVE 01/14/2020     Obesity:  Estimated body mass index is 44.58 kg/m as calculated from the following:   Height as of an earlier encounter on 05/11/20: '6\' 2"'$  (1.88 m).   Weight as of this encounter: 157.5 kg.    GI prophylaxis: PPI  Family Communication  :  Spouse - [name] updated 05/13/20.  Called on 05/14/2020 at 11:40 AM, shouting nonstop, witnessed by Dr. Waldron Labs, social work Percell Locus, she had a similar conversation with previous physician on 05/12/2020.  Had to hang up the phone because she was hysterically shouting on the phone.   Code Status :  Full Code  Diet :   Diet Order            Diet renal with fluid restriction Fluid restriction: 1200 mL Fluid; Room service appropriate? Yes; Fluid consistency: Thin  Diet effective now                  Disposition Plan  :   Status is: INP  The patient will require care spanning > 2 midnights and should be moved to inpatient because: Inpatient level of care appropriate due to severity of illness  Dispo:  The patient is from: Home              Anticipated d/c is  to: TBD              Anticipated d/c date is: > 3 days              Patient currently is not medically stable to d/c.   Difficult to place patient No   Barriers to discharge: T1 mass-awaiting further work up  Antimicorbials  :    Anti-infectives (From admission, onward)   None      Inpatient Medications  Scheduled Meds: . allopurinol  200 mg Oral Daily  . amiodarone  200 mg Oral BID  . amLODipine  10 mg Oral Daily  . apixaban  5 mg Oral BID  . atorvastatin  80 mg Oral QPM  . Chlorhexidine Gluconate Cloth  6 each Topical Daily  . Chlorhexidine Gluconate Cloth  6 each Topical Q0600  . FLUoxetine  80 mg Oral Daily  . gabapentin  100 mg Oral BID  . insulin aspart  0-5 Units Subcutaneous QHS  . insulin aspart  0-6 Units Subcutaneous TID WC  . insulin detemir  10 Units Subcutaneous QHS  . isosorbide mononitrate  60 mg Oral Once per day on Sun Tue Thu Sat  . metoprolol succinate  100 mg Oral Daily  . pantoprazole  40 mg Oral Daily  . ranolazine  500 mg Oral BID  . sevelamer carbonate  1,600 mg Oral TID WC  . ticagrelor  90 mg Oral BID  . traZODone  50 mg Oral QHS   Continuous Infusions: PRN Meds:.acetaminophen **OR** [DISCONTINUED] acetaminophen, albuterol, calcium carbonate (dosed in mg elemental calcium), feeding supplement (NEPRO CARB STEADY), labetalol, [DISCONTINUED] ondansetron **OR** ondansetron (ZOFRAN) IV, oxyCODONE-acetaminophen, sorbitol   Time Spent in minutes  25  See all Orders from today for further details   Lala Lund M.D on 05/14/2020 at 8:32 AM  To page go to [email or web address] - use universal password  [facility] -  Office  [phone]    Objective:   Vitals:   05/13/20 2057 05/13/20 2252 05/14/20 0500 05/14/20 0726  BP: 135/77   122/79  Pulse: 69 70  70  Resp: '19 20  20  '$ Temp: 97.6 F (36.4 C)   97.8 F (36.6 C)  TempSrc: Oral   Oral  SpO2: 100%   99%  Weight:   (!) 157.5  kg     Wt Readings from Last 3 Encounters:  05/14/20 (!) 157.5 kg  05/11/20 (!) 158 kg  05/01/20 (!) 156.2 kg     Intake/Output Summary (Last 24 hours) at 05/14/2020 0832 Last data filed at 05/14/2020 0803 Gross per 24 hour  Intake 840 ml  Output 2009 ml  Net -1169 ml     Physical Exam  Awake Alert, mild left-sided facial droop and minimal left-sided weakness leg more than arm, [place].AT,PERRAL Supple Neck,No JVD, No cervical lymphadenopathy appriciated.  Symmetrical Chest wall movement, Good air movement bilaterally, CTAB RRR,No Gallops, Rubs or new Murmurs, No Parasternal Heave +ve B.Sounds, Abd Soft, No tenderness, No organomegaly appriciated, No rebound - guarding or rigidity. No Cyanosis, Clubbing or edema, No new Rash or bruise    Data Review:    CBC Recent Labs  Lab 05/11/20 0909 05/12/20 0114 05/12/20 0203 05/12/20 0218 05/13/20 0334 05/14/20 0648  WBC 9.8 9.7 9.1  --  6.7 6.3  HGB 11.1* 11.4* 10.8* 11.6* 10.4* 11.4*  HCT 34.9* 37.9* 35.8* 34.0* 32.6* 35.9*  PLT 218 218 217  --  216 225  MCV 89.3 92.2 92.0  --  91.3 91.3  MCH 28.4 27.7 27.8  --  29.1 29.0  MCHC 31.8 30.1 30.2  --  31.9 31.8  RDW 16.0* 16.1* 16.0*  --  16.4* 16.3*  LYMPHSABS 1.0  --  1.0  --   --   --   MONOABS 0.7  --  0.7  --   --   --   EOSABS 0.1  --  0.1  --   --   --   BASOSABS 0.0  --  0.0  --   --   --     Chemistries  Recent Labs  Lab 05/11/20 0909 05/12/20 0114 05/12/20 0203 05/12/20 0218 05/13/20 0334 05/14/20 0648  NA 137  --  137 137 138 136  K 4.7  --  5.1 5.0 4.8 4.7  CL 106  --  106  --  107 103  CO2 20*  --  21*  --  21* 21*  GLUCOSE 161*  --  160*  --  150* 129*  BUN 39*  --  38*  --  45* 38*  CREATININE 5.36* 6.17* 6.27*  --  6.81* 6.38*  CALCIUM 7.6*  --  7.7*  --  7.6* 7.5*  MG  --   --  1.8  --   --   --   AST 13*  --  11*  --   --   --   ALT 9  --  11  --   --   --   ALKPHOS 76  --  89  --   --   --   BILITOT 0.5  --  0.9  --   --   --     ------------------------------------------------------------------------------------------------------------------ Recent Labs    05/14/20 0243  CHOL 220*  HDL 35*  LDLCALC 148*  TRIG 183*  CHOLHDL 6.3    Lab Results  Component Value Date   HGBA1C 8.3 (H) 05/13/2020   ------------------------------------------------------------------------------------------------------------------ Recent Labs    05/12/20 0200  TSH 5.446*   ------------------------------------------------------------------------------------------------------------------ No results for input(s): VITAMINB12, FOLATE, FERRITIN, TIBC, IRON, RETICCTPCT in the last 72 hours.  Coagulation profile Recent Labs  Lab 05/11/20 0909  INR 1.2    No results for input(s): DDIMER in the last 72 hours.  Cardiac Enzymes No results for input(s): CKMB, TROPONINI, MYOGLOBIN in the last 168 hours.  Invalid input(s): CK ------------------------------------------------------------------------------------------------------------------    Component Value Date/Time   BNP 574.2 (H) 04/29/2020 1442    Micro Results No results found for this or any previous visit (from the past 240 hour(s)).  Radiology Reports EEG  Result Date: 04/23/2020 Lora Havens, MD     04/23/2020  5:47 PM Patient Name: Garcia Carrisalez [place] MRN: [id number] Epilepsy Attending: Lora Havens Referring Physician/Provider: Dr Amie [place] Date: 04/23/2020 Duration: 21.32 mins Patient history: 50yo M with seizure like activity. EEG to evaluate for seizure. Level of alertness: Awake AEDs during EEG study: GBP, LEV Technical aspects: This EEG study was done with scalp electrodes positioned according to the 10-20 International system of electrode placement. Electrical activity was acquired at a sampling rate of '500Hz'$  and reviewed with a high frequency filter of '70Hz'$  and a low frequency filter of '1Hz'$ . EEG data were recorded continuously and digitally stored.  Description: The posterior dominant rhythm consists of 8 Hz activity of moderate voltage (25-35 uV) seen predominantly in posterior head regions, symmetric and reactive to eye opening and eye closing. Hyperventilation and photic stimulation were not performed.   IMPRESSION: This study is within normal  limits. No seizures or epileptiform discharges were seen throughout the recording. Lora Havens   DG Chest 1 View  Result Date: 04/29/2020 CLINICAL DATA:  Syncope 2 EXAM: CHEST  1 VIEW COMPARISON:  04/23/2020 FINDINGS: Stable large cardiac silhouette. There is mild central venous congestion improved from prior. Low lung volumes. No pneumothorax. Large dialysis catheter noted. IMPRESSION: 1. Improvement in venous congestion pattern. 2. Cardiomegaly. Electronically Signed   By: Suzy Bouchard M.D.   On: 04/29/2020 15:16   CT Head Wo Contrast  Result Date: 05/11/2020 CLINICAL DATA:  Recent fall EXAM: CT HEAD WITHOUT CONTRAST TECHNIQUE: Contiguous axial images were obtained from the base of the skull through the vertex without intravenous contrast. COMPARISON:  04/29/2020 FINDINGS: Brain: There is no acute intracranial hemorrhage, mass effect, or edema. Gray-white differentiation is preserved. There is no extra-axial fluid collection. Ventricles and sulci are within normal limits in size and configuration. Vascular: No hyperdense vessel. Minimal atherosclerotic calcification at the skull base. Skull: Calvarium is unremarkable. Sinuses/Orbits: No acute finding. Other: None. IMPRESSION: No evidence of acute intracranial injury. Electronically Signed   By: Macy Mis M.D.   On: 05/11/2020 11:24   CT Head Wo Contrast  Result Date: 04/29/2020 CLINICAL DATA:  Mental status change.  Syncope EXAM: CT HEAD WITHOUT CONTRAST TECHNIQUE: Contiguous axial images were obtained from the base of the skull through the vertex without intravenous contrast. COMPARISON:  CT head 04/23/2020 FINDINGS: Brain: No evidence of  acute infarction, hemorrhage, hydrocephalus, extra-axial collection or mass lesion/mass effect. Vascular: Negative for hyperdense vessel Skull: Negative Sinuses/Orbits: Paranasal sinuses clear.  Negative orbit Other: Motion degraded study IMPRESSION: Negative CT head Electronically Signed   By: Franchot Gallo M.D.   On: 04/29/2020 16:33   CT Head Wo Contrast  Result Date: 04/23/2020 CLINICAL DATA:  Altered mental status.  Hypertension. EXAM: CT HEAD WITHOUT CONTRAST TECHNIQUE: Contiguous axial images were obtained from the base of the skull through the vertex without intravenous contrast. COMPARISON:  April 12, 2020. FINDINGS: Brain: Ventricles and sulci are normal in size and configuration. There is no intracranial mass, hemorrhage, extra-axial fluid collection, midline shift. Brain parenchyma appears unremarkable. No acute infarct evident. Vascular: No hyperdense vessel. There is mild calcification in the right carotid siphon region. Skull: The bony calvarium appears intact. Sinuses/Orbits: Paranasal sinuses are clear. There is a degree of exophthalmos bilaterally. Orbits appear symmetric bilaterally. Other: Mastoid air cells are clear. IMPRESSION: 1.  Normal appearing brain parenchyma.  No mass or hemorrhage. 2. There is a degree of exophthalmos, symmetric. Correlation with thyroid function advised in this regard. 3.  Slight calcification in the right carotid siphon region. Electronically Signed   By: Lowella Grip III M.D.   On: 04/23/2020 09:53   CT Cervical Spine Wo Contrast  Result Date: 05/11/2020 CLINICAL DATA:  Recent fall EXAM: CT CERVICAL SPINE WITHOUT CONTRAST TECHNIQUE: Multidetector CT imaging of the cervical spine was performed without intravenous contrast. Multiplanar CT image reconstructions were also generated. COMPARISON:  None. FINDINGS: There is artifact related to body habitus. Alignment: Anteroposterior alignment is preserved. Skull base and vertebrae: No acute cervical spine  fracture. Vertebral body heights are maintained. Soft tissues and spinal canal: No prevertebral fluid or swelling. The spinal canal is obscured below C2-C3. Disc levels: Intervertebral disc heights are maintained. No degenerative stenosis. Upper chest: Chest dated separately. Other: None. IMPRESSION: No acute cervical spine fracture. Electronically Signed   By: Macy Mis M.D.   On: 05/11/2020 11:22   CT T-SPINE NO  CHARGE  Result Date: 05/11/2020 CLINICAL DATA:  Recent fall EXAM: CT THORACIC SPINE WITHOUT CONTRAST TECHNIQUE: Multidetector CT images of the thoracic were obtained using the standard protocol without intravenous contrast. COMPARISON:  None. FINDINGS: Artifact is present secondary to body habitus. Alignment: Preserved. Vertebrae: Vertebral body heights are maintained. There is soft tissue replacing a portion of the posterior T1 vertebral body with erosion of cortex and potential epidural extension. There is no acute fracture identified. Paraspinal and other soft tissues: Extra-spinal findings are better evaluated on concurrent dedicated imaging. Disc levels: Intervertebral disc heights are maintained. No significant osseous encroachment on the spinal canal or neural foramina. IMPRESSION: No acute fracture. Soft tissue replacing part of the posterior T1 vertebral body with erosion of cortex and potential epidural extension. MRI is recommended for further evaluation. Electronically Signed   By: Macy Mis M.D.   On: 05/11/2020 11:39   DG Chest Port 1 View  Result Date: 05/11/2020 CLINICAL DATA:  50 year old male with chest pain while at dialysis. EXAM: PORTABLE CHEST 1 VIEW COMPARISON:  Portable chest 04/29/2020 and earlier. FINDINGS: Portable AP upright view at 0944 hours. Stable left chest AICD, right chest dialysis catheter. Mildly larger, improved lung volumes. Mild cardiomegaly. Other mediastinal contours are within normal limits. Visualized tracheal air column is within normal limits.  No pneumothorax. Allowing for portable technique the lungs are clear. No acute osseous abnormality identified. IMPRESSION: No acute cardiopulmonary abnormality. Electronically Signed   By: Genevie Ann M.D.   On: 05/11/2020 10:02   DG Chest Portable 1 View  Result Date: 04/23/2020 CLINICAL DATA:  Altered mental status Hypertensive during dialysis Unresponsive EXAM: PORTABLE CHEST 1 VIEW COMPARISON:  04/12/2020 FINDINGS: Unchanged cardiomegaly. Scattered strandy lung opacities likely due to atelectasis. Dual lead left chest wall AICD unchanged in configuration. Dialysis catheter unchanged in position. IMPRESSION: Unchanged mild cardiomegaly. Electronically Signed   By: Miachel Roux M.D.   On: 04/23/2020 08:12   CT Angio Chest/Abd/Pel for Dissection W and/or Wo Contrast  Result Date: 05/11/2020 CLINICAL DATA:  Chest and abdominal pain. EXAM: CT ANGIOGRAPHY CHEST, ABDOMEN AND PELVIS TECHNIQUE: Non-contrast CT of the chest was initially obtained. Multidetector CT imaging through the chest, abdomen and pelvis was performed using the standard protocol during bolus administration of intravenous contrast. Multiplanar reconstructed images and MIPs were obtained and reviewed to evaluate the vascular anatomy. CONTRAST:  143m OMNIPAQUE IOHEXOL 350 MG/ML SOLN COMPARISON:  July 08, 2018. FINDINGS: CTA CHEST FINDINGS Cardiovascular: Preferential opacification of the thoracic aorta. No evidence of thoracic aortic aneurysm or dissection. Mild cardiomegaly is noted. Coronary artery calcifications are noted. No pericardial effusion. Mediastinum/Nodes: No enlarged mediastinal, hilar, or axillary lymph nodes. Thyroid gland, trachea, and esophagus demonstrate no significant findings. Lungs/Pleura: Lungs are clear. No pleural effusion or pneumothorax. Musculoskeletal: No chest wall abnormality. No acute or significant osseous findings. Review of the MIP images confirms the above findings. CTA ABDOMEN AND PELVIS FINDINGS VASCULAR  Aorta: Atherosclerosis of abdominal aorta is noted without aneurysm or dissection. Celiac: Patent without evidence of aneurysm, dissection, vasculitis or significant stenosis. SMA: Patent without evidence of aneurysm, dissection, vasculitis or significant stenosis. Renals: 2 renal arteries are noted on each side, and there is no evidence of significant stenosis or thrombosis present. IMA: Patent without evidence of aneurysm, dissection, vasculitis or significant stenosis. Inflow: Patent without evidence of aneurysm, dissection, vasculitis or significant stenosis. Veins: No obvious venous abnormality within the limitations of this arterial phase study. Review of the MIP images confirms the above findings. NON-VASCULAR  Hepatobiliary: No focal liver abnormality is seen. No gallstones, gallbladder wall thickening, or biliary dilatation. Pancreas: Unremarkable. No pancreatic ductal dilatation or surrounding inflammatory changes. Spleen: Normal in size without focal abnormality. Adrenals/Urinary Tract: Stable right adrenal myelolipoma. Left adrenal gland is unremarkable. Bilateral renal atrophy is noted. Stable left renal cyst is noted. No hydronephrosis or renal obstruction is noted. No renal or ureteral calculi are noted. Urinary bladder is unremarkable. Stomach/Bowel: Stomach is within normal limits. Appendix appears normal. No evidence of bowel wall thickening, distention, or inflammatory changes. Lymphatic: No significant adenopathy is noted. Reproductive: Prostate is unremarkable. Other: No abdominal wall hernia or abnormality. No abdominopelvic ascites. Musculoskeletal: No acute or significant osseous findings. Review of the MIP images confirms the above findings. IMPRESSION: 1. No evidence of thoracic or abdominal aortic aneurysm or dissection. 2. Coronary artery calcifications are noted suggesting coronary artery disease. 3. Bilateral renal atrophy is noted. 4. Stable right adrenal myelolipoma. 5. No acute  abnormality seen in the chest, abdomen or pelvis. 6. Aortic atherosclerosis. Aortic Atherosclerosis (ICD10-I70.0). Electronically Signed   By: Marijo Conception M.D.   On: 05/11/2020 11:28

## 2020-05-14 NOTE — Progress Notes (Signed)
Pt called this RN to room reports he cant breath, felt his throat was closing up. This RN listened to all lung fields (diminished) and no stridor in upper airway noted. Pt then went into a 3 sec tonic clonic seziure like activity, then pt went unresponsive and apneic. Code Blue called, unable to palpate pulse. Chest Compressions started for total of 4-6, pt then sat up and was fighting this RN. Pt still had some shaking. Pupils equal and reactive. Pt moved to bed x 6 staff memebers at this time. Code stroke was called. Stroke team arrived. See notes from Code Stroke team.

## 2020-05-14 NOTE — Progress Notes (Signed)
Patient ID: Edwin [name], male   DOB: 1970/06/21, 50 y.o.   MRN: [id number] [facility] Progress Note   Assessment/ Plan:   1.  Left-sided weakness: Etiology unclear and CT scan shows possible T1 mass for which neurosurgery was consulted and recommendations noted for MRI with contrast.  Per the patient, it appears that MRI [place] not be done today due to presence of PPM.  Will await additional directions regarding management and temporarily leave him on schedule for dialysis today. 2. ESRD: He is usually on a Monday/Wednesday/Friday schedule and apparently had dialysis yesterday with plans for dialysis again today after gadolinium exposure for MRI-if this is not done, will push his dialysis to tomorrow. 3. Anemia: Without overt blood loss and hemoglobin/hematocrit within acceptable range. 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 5.  Chronic atrial fibrillation: On anticoagulation with Eliquis, currently rate controlled. 6. Hypertension: Blood pressure within acceptable range, continue to monitor with hemodialysis/ongoing management.  Subjective:   Reports to be having some left-sided weakness and frustrated about MRI situation.   Objective:   BP 122/79 (BP Location: Left Arm)   Pulse 70   Temp 97.8 F (36.6 C) (Oral)   Resp 20   Wt (!) 157.5 kg   SpO2 99%   BMI 44.58 kg/m   Physical Exam: Gen: Appears comfortable resting in recliner, watching television CVS: Pulse regular rhythm, normal rate, S1 and S2 normal Resp: Clear to auscultation bilaterally, no rales/rhonchi.  Right IJ TDC Abd: Soft, obese, nontender, bowel sounds normal Ext: Trace-1+ left lower extremity pretibial edema, no edema right side.  Labs: BMET Recent Labs  Lab 05/11/20 0909 05/12/20 0114 05/12/20 0203 05/12/20 0218 05/12/20 0443 05/13/20 0334 05/14/20 0648  NA 137  --  137 137  --  138 136  K 4.7  --  5.1 5.0  --  4.8 4.7  CL  106  --  106  --   --  107 103  CO2 20*  --  21*  --   --  21* 21*  GLUCOSE 161*  --  160*  --   --  150* 129*  BUN 39*  --  38*  --   --  45* 38*  CREATININE 5.36* 6.17* 6.27*  --   --  6.81* 6.38*  CALCIUM 7.6*  --  7.7*  --   --  7.6* 7.5*  PHOS  --   --   --   --  5.8* 5.8* 5.4*   CBC Recent Labs  Lab 05/11/20 0909 05/12/20 0114 05/12/20 0203 05/12/20 0218 05/13/20 0334 05/14/20 0648  WBC 9.8 9.7 9.1  --  6.7 6.3  NEUTROABS 7.9*  --  7.3  --   --   --   HGB 11.1* 11.4* 10.8* 11.6* 10.4* 11.4*  HCT 34.9* 37.9* 35.8* 34.0* 32.6* 35.9*  MCV 89.3 92.2 92.0  --  91.3 91.3  PLT 218 218 217  --  216 225     Medications:    . allopurinol  200 mg Oral Daily  . amiodarone  200 mg Oral BID  . amLODipine  10 mg Oral Daily  . apixaban  5 mg Oral BID  . atorvastatin  80 mg Oral QPM  . Chlorhexidine Gluconate Cloth  6 each Topical Daily  . Chlorhexidine Gluconate Cloth  6 each Topical Q0600  . FLUoxetine  80 mg Oral Daily  . gabapentin  100 mg Oral BID  . insulin  aspart  0-5 Units Subcutaneous QHS  . insulin aspart  0-6 Units Subcutaneous TID WC  . insulin detemir  10 Units Subcutaneous QHS  . isosorbide mononitrate  60 mg Oral Once per day on Sun Tue Thu Sat  . metoprolol succinate  100 mg Oral Daily  . pantoprazole  40 mg Oral Daily  . ranolazine  500 mg Oral BID  . sevelamer carbonate  1,600 mg Oral TID WC  . ticagrelor  90 mg Oral BID  . traZODone  50 mg Oral QHS   Elmarie Shiley, MD 05/14/2020, 10:28 AM

## 2020-05-14 NOTE — Progress Notes (Signed)
RT NOTE:  Pt puts CPAP on when ready for bed. CPAP set up at bedside.

## 2020-05-14 NOTE — Code Documentation (Signed)
Stroke Response Nurse Documentation Code Documentation  Edwin [name] is a 50 y.o. male arriving to [facility] on 05/11/2020 with past medical hx of CAD, CHF, Diabetes, Hyperlipidemia, Hypertension, and Afib on Eliquis. Code stroke was activated by [facility] staff after patient was noted to have an episode of worsening left sided weakness after he went unresponsive. Patient was LKW at 1425 before this episode where symptoms worsened. When he was admitted to the unit, he was noted to have left sided numbness, weakness, and left foot drop. HE has been waiting for potential MRI today, but was told his pacemaker was not compatible.   He had just returned to the unit after he had complete CT Head and Spine. Stroke team at the bedside after code activation. Labs drawn and patient cleared for CT by MD. Patient to CT with team. NIHSS 11, see documentation for details and code stroke times. Patient with decreased LOC, left facial droop, left arm weakness, bilateral leg weakness, left decreased sensation and dysarthria  on exam. The following imaging was completed:  CT, CTA head and neck. Patient is not a candidate for tPA due to being on Eliquis and symptoms being > 24 hours. Care/Plan: EEG and Q4 Hour mNIHSS/VS. Bedside handoff with Edwin Applebaum, RN .    Kathrin Greathouse  Stroke Response RN

## 2020-05-14 NOTE — Consult Note (Addendum)
Neurology Consultation Reason for Consult: code stroke Referring Physician: Dr. Candiss Norse  CC: left sided weakness and spells of altered awareness.  History is obtained from: care team  HPI: Edwin [name] is a 50 y.o. male who was transferred from [facility] Friday for back pain and SOB. Found to have a T1 lesion on CT of the chest. There was concern for aggressive tumor or other mass. Repeat CT thorax showed no such finding after contrast administration. Therefore, neurosurgery has signed off. He continues to complain of back pain. I received a call about him this morning concerning next steps, as he continued to complain of various left-sided symptoms including weakness and numbness, and facial droop. He was noted to be on maximal medical therapy already, receiving eliquis for PAF and ASA. He cannot have brain MRI due to an incompatible pacer. Therefore, it was felt that the best course of action to exclude a stroke with onset at least as early as Friday was to obtain repeat head CT without contrast. This was done. I reviewed both studies, and cannot see any clear indication of new right hemisphere or brainstem stroke. No ICH. He has been observed to have variable responsiveness, with periods of unresponsiveness. Shaking and seizure-like activity, with prior h/o pseudoseizures.    LKW: Friday? tpa given?: no, out of time window. Premorbid modified rankin scale: poor level of functioning, 3+ mRS score ICH Score: n/a    ROS: Unable to obtain due to altered mental status.   Past Medical History:  Diagnosis Date  . CAD (coronary artery disease)   . Chronic systolic heart failure ([place])   . Diabetes mellitus without complication ([place])   . Heart attack ([place])   . Hyperlipidemia   . Hypertension   . Ischemic cardiomyopathy   . Paroxysmal atrial fibrillation ([name])   . Renal disorder      Family History  Problem Relation Age of Onset  . Diabetes Mother   . Peripheral vascular disease Mother   .  COPD Father      Social History:  reports that he has quit smoking. His smokeless tobacco use includes snuff. He reports that he does not drink alcohol and does not use drugs.   Exam: Current vital signs: BP 131/80 (BP Location: Left Arm)   Pulse 69   Temp 97.6 F (36.4 C) (Oral)   Resp 20   Wt (!) 157.5 kg   SpO2 100%   BMI 44.58 kg/m  Vital signs in last 24 hours: Temp:  [97.6 F (36.4 C)-97.8 F (36.6 C)] 97.6 F (36.4 C) (02/14 1133) Pulse Rate:  [69-70] 69 (02/14 1133) Resp:  [18-20] 20 (02/14 1133) BP: (122-135)/(77-80) 131/80 (02/14 1133) SpO2:  [99 %-100 %] 100 % (02/14 1133) Weight:  [157.5 kg] 157.5 kg (02/14 0500)   Physical Exam  Constitutional: Obese, short of breath.  Psych: Affect appropriate to situation anxious Eyes: No scleral injection HENT: No OP obstrucion MSK: no joint deformities.  Cardiovascular: Irregular rate.  Respiratory: Effortful and labored breathing GI: Soft.  No distension. There is no tenderness.  Skin: WDI  Neuro: Mental Status: Drowsy, has trouble cooperating with exam. Fluctuating performance, and keeps his eyes mostly closed throughout the exam. Cranial Nerves: II: Visual Fields are notable for diminished left visual field finger-counting accuracy in the left >> right eye [facility] incongruous?). Pupils are equal, round, and reactive to light.  III,IV, VI: EOMI without ptosis or diploplia.  V: Facial sensation is asymmetric and less on the left. VII:  Facial movement is asymmetric with upper and lower facial paresis on the left with Bell's phenomenon.  VIII: hearing is intact to voice X: deferred XI: deferred XII: tongue is midline without atrophy or fasciculations.  Motor: Tone is normal. Bulk is normal. Not able to keep feet off the bed on either side, but transient anti-gravity power. Both arms drift, left more than right. Sensory: Sensation is diminished on the left to light touch and temperature in the arm and leg and there  is extinction to DSS on the left as well. Deep Tendon Reflexes: Areflexic. Plantars: Toes are downgoing bilaterally. Cerebellar: FNF and HKS are intact bilaterally; slow but accurate.  Initial NIHSS roughly 11 (see Ms. Bell's documentation).  I have reviewed labs in epic and the results pertinent to this consultation are: ESRD.   I have reviewed the images obtained: No LVO. No ICH, No vascular malformation. No mass lesion.  Impression:  Stroke like episode with symptoms spanning at least 3 days, now. He is out of the time window for any acute intervention. Also, he's on maximal medical therapy with eliquis plus aspirin. Seizures are a consideration, but he's been worked up extensively for this possibility with the most recent EEG 04/23/20. Doubt seizures, but remains on the list of possibilities.  Recommendations: 1) EEG 2) Continue current stroke care 3) q4h neurochecks 4) There seems to be an element of variability in performance which is suspicious for a component of functional neurological disorder. It [place] be worthwhile to consider getting psychiatric input. The presentation is very dramatic (unresponsiveness, shaking, paralysis, not moving either side of the body very well, etc.), and there is apparently no way to get brain MRI. The follow up CT shows no indication of evolving stroke. Therefore, psychiatric contribution to the situation seems likely in my estimation.  Will follow up after EEG is resulted. No other new recommendations at this time.  Thank you.    Minette Brine Javoni Lucken

## 2020-05-14 NOTE — Unmapped (Signed)
Hi, could someone from nursing call and get additional information?  Dr. Hulan Fess is out of the office today but could set up with any available provider if needs visit  Thanks

## 2020-05-14 NOTE — Unmapped (Signed)
I spoke to patient spouse  and they are requesting a return call regarding patient health.     Best callback day/time: Anytime   Best callback number: [phone]    Please let me know if there is anything I can do to help.

## 2020-05-14 NOTE — Unmapped (Signed)
Follow up regarding pt's wife's concerns.  [name] states pt with new mass on vertebrae, pt currently at [facility] waiting on an MRI  Wife, [name], yelling on the phone , extreme frustration that MRI wasn't done over the weekend, she is trying to get pt back into the [facility] system.  RN asked pt's wife to call back if [facility] able to help. Also asked wife to discuss with inpatient case management @ [facility] re: transfer to [facility]

## 2020-05-15 DIAGNOSIS — R404 Transient alteration of awareness: Secondary | ICD-10-CM | POA: Diagnosis not present

## 2020-05-15 DIAGNOSIS — I482 Chronic atrial fibrillation, unspecified: Secondary | ICD-10-CM | POA: Diagnosis not present

## 2020-05-15 LAB — GLUCOSE, CAPILLARY
Glucose-Capillary: 97 mg/dL (ref 70–99)
Glucose-Capillary: 84 mg/dL (ref 70–99)
Glucose-Capillary: 97 mg/dL (ref 70–99)
Glucose-Capillary: 199 mg/dL — ABNORMAL HIGH (ref 70–99)
Glucose-Capillary: 160 mg/dL — ABNORMAL HIGH (ref 70–99)

## 2020-05-15 MED ORDER — HYDRALAZINE HCL 20 MG/ML IJ SOLN: 10.0000 mg | Freq: Four times a day (QID) | INTRAMUSCULAR | Status: DC | PRN

## 2020-05-15 MED ORDER — HEPARIN SODIUM (PORCINE) 1000 UNIT/ML IJ SOLN
INTRAMUSCULAR | Status: AC
  Filled 2020-05-15: qty 6

## 2020-05-15 NOTE — Progress Notes (Signed)
Discharge paperwork given to patient. Patient is now waiting on PTAR. Aware that transportation [facility] be behind and [facility] leave after dinner through the night.

## 2020-05-15 NOTE — Progress Notes (Signed)
This RN and case management went into room after PT recommended SNF/Rehab. Pt refuses SNF. Educated pt that this RN was not able to safely get him into w/c and down to his wifes care safely. Case Management offered EMS transport home. PT refused as well. Wife reports it is the patients decision. Educated pt on his extreme weakness, and how he almost fell during therapy today. Also educated on the blood thinners he was taking. Pt verbalizes understanding of the risk he is taking going home vs recommendation given by healthcare staff.  Reports his wife will be here in an hour and will come to the room to get him and take him to car. Case Manager present during conversation.

## 2020-05-15 NOTE — Progress Notes (Signed)
RT NOTE:  CPAP at bedside for use when patient is ready. Pt is discharging home tonight.

## 2020-05-15 NOTE — Progress Notes (Signed)
Attempted to return wife phone call. No answer, message left.

## 2020-05-15 NOTE — Evaluation (Signed)
Occupational Therapy Evaluation Patient Details Name: Edwin [name] MRN: [id number] DOB: 05-Jul-1970 Today's Date: 05/15/2020    History of Present Illness Patient is a 50 y.o. male with PMHx of ESRD on HD [name], chronic systolic heart failure, PAF on Eliquis, CAD-numerous PCI's-most recently in December 2021, s/p permanent pacemaker implantation, pseudoseizures, recent Covid positive test on 1/24-presented to [facility] on 2/11 with intermittent chest pain, and subtle left-sided weakness.  CT T-spine showed a T1 vertebral body mass. Repeat CT thorax showed no such finding after contrast administration. Pt also reporting left sided symptoms including weakness and numbness and facial droop. Cannot have brain MRI due to incompatible pacer. CT head obtained and no indication of new right hemisphere stroke. No ICH.   Clinical Impression   Pt PTA: pt from home and reports independence with ADL and mobility. Pt currently, stood for 2 mins and then went down to elbows at sink and appeared to be very nervous and pt briefly unresponsive. Then pt sat down abruptly; BP taken 175/100 in sitting; in standing after a few mins rest 186/97. Pt limited by decreased strength, poor communication when not feeling well with sudden needs to sit down abruptly and decreased activity tolerance. Pt minA overall for ADL. Pt with brief episode of unresponsiveness after walking 5' with RW and needing a chair behind hime and then the stedy to transfer to bed. Pt with poor insight into deficits. Pt reporting "I just felt weak. I'll be fine when I go home." Pt would benefit from continued OT skilled services. OT following acutely. (pt has refused SNF and CIR- wanting to go home).    Follow Up Recommendations  CIR    Equipment Recommendations  3 in 1 bedside commode    Recommendations for Other Services Rehab consult     Precautions / Restrictions Precautions Precautions: Fall;Other (comment) Precaution Comments: High fall risk,  left foot drop Restrictions Weight Bearing Restrictions: No      Mobility Bed Mobility Overal bed mobility: Needs Assistance Bed Mobility: Supine to Sit;Sit to Supine     Supine to sit: Min guard Sit to supine: Min guard   General bed mobility comments: Pt using arms to progress BLE's on and off the bed    Transfers Overall transfer level: Needs assistance Equipment used: Rolling walker (2 wheeled);Ambulation equipment used Transfers: Sit to/from Stand Sit to Stand: Mod assist;+2 physical assistance;Min assist         General transfer comment: Pt requiring minA to rise from elevated bed height; modA + 2 to rise from lower surface of chair and use of sink to pull up to standing. Utilized Stedy towards end of session to return to bed    Balance Overall balance assessment: Needs assistance Sitting-balance support: Feet supported Sitting balance-Leahy Scale: Fair     Standing balance support: Bilateral upper extremity supported Standing balance-Leahy Scale: Poor Standing balance comment: reliant on BUE support                           ADL either performed or assessed with clinical judgement   ADL Overall ADL's : Needs assistance/impaired Eating/Feeding: Set up;Bed level   Grooming: Min guard;Standing;Sitting Grooming Details (indicate cue type and reason): stood for 2 mins and then wen tdown to elbows at sink and appeared to be very nervous. Then pt sat down abruptly; BP taken 159/112 in sitting; in standing after a few mins rest 156/96 Upper Body Bathing: Set up;Sitting   Lower  Body Bathing: Min guard;Sitting/lateral leans;Sit to/from stand;Cueing for safety   Upper Body Dressing : Set up;Sitting   Lower Body Dressing: Min guard;Sitting/lateral leans;Cueing for safety;Cueing for compensatory techniques;Sit to/from stand Lower Body Dressing Details (indicate cue type and reason): sitting EOB for donning shoes; pt seated fro pulling up pants and suspenders  to above shoulders Toilet Transfer: Moderate assistance;+2 for physical assistance;+2 for safety/equipment;Stand-pivot Toilet Transfer Details (indicate cue type and reason): using stedy for sit to stand from low chair in room after grooming at sn Toileting- Clothing Manipulation and Hygiene: Minimal assistance;Cueing for safety;Sitting/lateral lean;Sit to/from stand       Functional mobility during ADLs: Moderate assistance;+2 for physical assistance;+2 for safety/equipment;Rolling walker;Cueing for sequencing General ADL Comments: pt limited by decreased strength, poor communication when not feeling well with sudden needs to sit down abruptly and decreased activity tolerance.     Vision Baseline Vision/History: No visual deficits Patient Visual Report: Other (comment) Additional Comments: L eye won't close     Perception     Praxis      Pertinent Vitals/Pain Pain Assessment: Faces Faces Pain Scale: No hurt Pain Descriptors / Indicators: Discomfort Pain Intervention(s): Monitored during session     Hand Dominance Left   Extremity/Trunk Assessment Upper Extremity Assessment Upper Extremity Assessment: Generalized weakness RUE Deficits / Details: AROM. WFLs; 4/5 MM grade RUE Sensation: WNL LUE Deficits / Details: AROM. WFLs; 4/5 MM grade LUE Sensation: WNL   Lower Extremity Assessment Lower Extremity Assessment: Overall WFL for tasks assessed RLE Deficits / Details: Hip flexion 2/5, knee extension 5/5, ankle dorsiflexion 5/5 LLE Deficits / Details: Hip flexion 2/5, knee extension 5/5, ankle dorsiflexion 0/5   Cervical / Trunk Assessment Cervical / Trunk Assessment: Other exceptions Cervical / Trunk Exceptions: increased body habitus   Communication Communication Communication: No difficulties   Cognition Arousal/Alertness: Awake/alert Behavior During Therapy: WFL for tasks assessed/performed Overall Cognitive Status: Impaired/Different from baseline Area of  Impairment: Safety/judgement                         Safety/Judgement: Decreased awareness of safety;Decreased awareness of deficits     General Comments: Poor awareness of safety/deficits   General Comments  Some inconsistent findings, but pt unsafe to go home at this time due to pt nearly fell during OT/PT session due to pt wearing out before getting to bed with no warning. pt appeared visibly anxious with chest rising and falling.    Exercises     Shoulder Instructions      Home Living Family/patient expects to be discharged to:: Private residence Living Arrangements: Spouse/significant other Available Help at Discharge: Family;Available 24 hours/day Type of Home: House Home Access: Stairs to enter [employer] of Steps: 2 Entrance Stairs-Rails: Right;Left;Can reach both Home Layout: One level     Bathroom Shower/Tub: Occupational psychologist: Handicapped height     Home Equipment: Environmental consultant - 4 wheels;Cane - single point;Bedside commode;Walker - 2 wheels;Shower seat;Wheelchair - manual   Additional Comments: Previously had L foot AFO, but created wound so threw it away      Prior Functioning/Environment Level of Independence: Independent with assistive device(s)        Comments: Uses cane, household ambulator        OT Problem List: Decreased activity tolerance;Impaired balance (sitting and/or standing);Decreased cognition;Decreased safety awareness;Other (comment);Cardiopulmonary status limiting activity;Decreased knowledge of use of DME or AE      OT Treatment/Interventions: Self-care/ADL training;Therapeutic exercise;Energy conservation;DME and/or  AE instruction;Therapeutic activities;Patient/family education;Balance training    OT Goals(Current goals can be found in the care plan section) Acute Rehab OT Goals Patient Stated Goal: to go home, not have facial droop OT Goal Formulation: With patient Time For Goal Achievement:  05/29/20 Potential to Achieve Goals: Good ADL Goals Additional ADL Goal #1: Pt will perform x5 min of OOB ADL task with minimal cues to rest if needed. Additional ADL Goal #2: Pt will perform sit to stand with minA overall in order to increase independence for OOB ADL.  OT Frequency: Min 2X/week   Barriers to D/C:            Co-evaluation PT/OT/SLP Co-Evaluation/Treatment: Yes Reason for Co-Treatment: Complexity of the patient's impairments (multi-system involvement);To address functional/ADL transfers;For patient/therapist safety PT goals addressed during session: Mobility/safety with mobility OT goals addressed during session: ADL's and self-care      AM-PAC OT "6 Clicks" Daily Activity     Outcome Measure Help from another person eating meals?: None Help from another person taking care of personal grooming?: A Little Help from another person toileting, which includes using toliet, bedpan, or urinal?: A Little Help from another person bathing (including washing, rinsing, drying)?: A Little Help from another person to put on and taking off regular upper body clothing?: None Help from another person to put on and taking off regular lower body clothing?: A Little 6 Click Score: 20   End of Session Equipment Utilized During Treatment: Gait belt;Rolling walker Nurse Communication: Mobility status  Activity Tolerance: Patient limited by fatigue Patient left: in bed;with call bell/phone within reach;with bed alarm set  OT Visit Diagnosis: Unsteadiness on feet (R26.81);Other symptoms and signs involving cognitive function                Time: [id number] OT Time Calculation (min): 42 min Charges:  OT General Charges $OT Visit: 1 Visit OT Evaluation $OT Eval Moderate Complexity: 1 Mod  Jefferey Pica, OTR/L Acute Rehabilitation Services Pager: [phone] Office: [phone] C 05/15/2020, 3:16 PM

## 2020-05-15 NOTE — Discharge Instructions (Signed)
Do not drive, operate heavy machinery, perform activities at heights, swimming or participation in water activities or provide baby sitting services until you have seen by your Neurologist and advised to do so again.  Follow with Primary MD Caryl Never, MD in 7 days   Get CBC, CMP, 2 view Chest X ray -  checked next visit within 1 week by Primary MD    Activity: As tolerated with Full fall precautions use walker/cane & assistance as needed  Disposition Home   Diet: Renal, 1.5 lit/day total fluid restrictoin.  Accuchecks 4 times/day, Once in AM empty stomach and then before each meal. Log in all results and show them to your Prim.MD in 3 days. If any glucose reading is under 80 or above 300 call your Prim MD immidiately. Follow Low glucose instructions for glucose under 80 as instructed.    Special Instructions: If you have smoked or chewed Tobacco  in the last 2 yrs please stop smoking, stop any regular Alcohol  and or any Recreational drug use.  On your next visit with your primary care physician please Get Medicines reviewed and adjusted.  Please request your Prim.MD to go over all Hospital Tests and Procedure/Radiological results at the follow up, please get all Hospital records sent to your Prim MD by signing hospital release before you go home.  If you experience worsening of your admission symptoms, develop shortness of breath, life threatening emergency, suicidal or homicidal thoughts you must seek medical attention immediately by calling 911 or calling your MD immediately  if symptoms less severe.  You Must read complete instructions/literature along with all the possible adverse reactions/side effects for all the Medicines you take and that have been prescribed to you. Take any new Medicines after you have completely understood and accpet all the possible adverse reactions/side effects.   Do not drive when taking Pain medications.  Do not take more than prescribed Pain,  Sleep and Anxiety Medications  Wear Seat belts while driving.   Please note  You were cared for by a hospitalist during your hospital stay. If you have any questions about your discharge medications or the care you received while you were in the hospital after you are discharged, you can call the unit and asked to speak with the hospitalist on call if the hospitalist that took care of you is not available. Once you are discharged, your primary care physician will handle any further medical issues. Please note that NO REFILLS for any discharge medications will be authorized once you are discharged, as it is imperative that you return to your primary care physician (or establish a relationship with a primary care physician if you do not have one) for your aftercare needs so that they can reassess your need for medications and monitor your lab values.

## 2020-05-15 NOTE — TOC Initial Note (Addendum)
Transition of Care [facility]) - Initial/Assessment Note    Patient Details  Name: Edwin [name] MRN: [id number] Date of Birth: 1970-07-12  Transition of Care [facility]) CM/SW Contact:    Zenon Mayo, RN Phone Number: 05/15/2020, 2:49 PM  Clinical Narrative:                 NCM spoke with patient at bedside explained to him that per physical  Therapy he is risk for falling and he only walked 5 feet  Needs plus two ast  NCM informed him he needs to go to a SNF for rehab, he states he is not going to a SNF he is going home today.  NCM spoke with wife and explained this to her that he needs a SNF, she states that she can not make this decision for him , he needs to make the decision.  Patient again states he is not going to a SNF . NCM explained to him that if he falls he could break a hip.  Staff RN also states he is on blood thinners as well. Patient still refusing.   Wife asked if his check will have to go to the facility, NCM informed her that his primary insurance is Medicare and his check will not have to go to the facility.  She states his check is the only income they have. Patient states he was getting outpatient therapy with [facility] and would like to continue to do that he does not want Home Health. He has a script to resume this therapy. He also states he does not want ambulance transport.  NCM asked who was going to put him in the car he said his wife.  MD spoke with patient and then he decided that we can call ambulance transport for him.  [facility] set up for [facility]. [facility] set up with [facility] transport which will be later tonight.     Expected Discharge Plan: Skilled Nursing Facility Barriers to Discharge: No Barriers Identified   Patient Goals and CMS Choice Patient states their goals for this hospitalization and ongoing recovery are:: he wants to go home today   Choice offered to / list presented to : NA  Expected Discharge Plan and Services Expected Discharge  Plan: [place]   Discharge Planning Services: CM Consult Post Acute Care Choice: NA Living arrangements for the past 2 months: Single Family Home Expected Discharge Date: 05/15/20                 DME Agency: NA       HH Arranged: NA          Prior Living Arrangements/Services Living arrangements for the past 2 months: Single Family Home Lives with:: Spouse Patient language and need for interpreter reviewed:: Yes Do you feel safe going back to the place where you live?: Yes      Need for Family Participation in Patient Care: Yes (Comment) Care giver support system in place?: Yes (comment) Current home services: DME (he has walker, wheelchair and rollator) Criminal Activity/Legal Involvement Pertinent to Current Situation/Hospitalization: No - Comment as needed  Activities of Daily Living      Permission Sought/Granted                  Emotional Assessment Appearance:: Appears stated age Attitude/Demeanor/Rapport: Engaged Affect (typically observed): Appropriate Orientation: : Oriented to Self,Oriented to Place,Oriented to  Time,Oriented to Situation Alcohol / Substance Use: Not Applicable Psych Involvement: No (comment)  Admission diagnosis:  Weakness of left lower extremity [R29.898] Chronic midline thoracic back pain [M54.6, G89.29] Mass of thoracic structure [R22.2] Chest pain [R07.9] Patient Active Problem List   Diagnosis Date Noted  . Transient alteration of awareness   . Mass of thoracic structure 05/11/2020  . COVID-19 virus infection 04/29/2020  . Recurrent episodes of unresponsiveness 04/23/2020  . Dilated cardiomyopathy ([place]) 04/23/2020  . OSA (obstructive sleep apnea) 04/23/2020  . Syncope 04/12/2020  . Elevated troponin 04/12/2020  . Atrial fibrillation, chronic ([place]) 04/12/2020  . GERD (gastroesophageal reflux disease) 04/12/2020  . Gout 04/12/2020  . Depression 04/12/2020  . Obesity, Class III, BMI 40-49.9 (morbid obesity)  ([place])   . Chronic systolic CHF (congestive heart failure) ([place])   . ESRD (end stage renal disease) ([place]) 01/23/2020  . CAD (coronary artery disease) 01/23/2020  . 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 Essential hypertension 01/23/2020  . Hyperlipidemia 01/23/2020  . Atrial fibrillation with rapid ventricular response ([place]) 01/23/2020  . Syncope and collapse 10/28/2018  . Chest pain 07/08/2018  . NSTEMI (non-ST elevated myocardial infarction) ([place]) 03/08/2018  . Infection of left knee ([place]) 12/16/2017   PCP:  Caryl Never, MD Pharmacy:   [facility] [place], [place] - [address] [place] [address] Phone: [phone] Fax: [phone]     Social Determinants of Health (SDOH) Interventions    Readmission Risk Interventions Readmission Risk Prevention Plan 05/15/2020 01/24/2020  Transportation Screening Complete Complete  PCP or Specialist Appt within 3-5 Days - Complete  [place] or [place] - Complete  Social Work Consult for [place] Planning/Counseling - Patient refused  Palliative Care Screening - Not Applicable  Medication Review Press photographer) Complete Referral to Pharmacy  PCP or Specialist appointment within 3-5 days of discharge Complete -  [place] or [place] Complete -  SW Recovery Care/Counseling Consult Complete -  [place] Patient Refused -  Some recent data might be hidden

## 2020-05-15 NOTE — Procedures (Signed)
Patient seen on Hemodialysis-- feeling somewhat better this morning and looking forward to go home and continue OP PT. CT head without acute lesions and seen yesterday by neurology for atypical symptoms. BP (!) 118/94   Pulse 69   Temp 97.6 F (36.4 C) (Oral)   Resp 12   Wt (!) 159.5 kg   SpO2 100%   BMI 45.15 kg/m   QB 400, UF goal 2L Tolerating treatment without complaints at this time.   Elmarie Shiley MD [facility]. Office # [phone] Pager # [phone] 8:41 AM

## 2020-05-15 NOTE — Discharge Summary (Signed)
Edwin [name] [id number] DOB: 1970/04/08 DOA: 05/11/2020  PCP: Caryl Never, MD  Admit date: 05/11/2020  Discharge date: 05/15/2020  Admitted From: Home   Disposition:  Home - refused SNF by him and wife   Recommendations for Outpatient Follow-up:   Follow up with PCP in 1-2 weeks  PCP Please obtain BMP/CBC, 2 view CXR in 1week,  (see Discharge instructions)   PCP Please follow up on the following pending results: needs close outpt Neuro follow up.   Home Health: PT OT, RN ordered patient refusing.  Prescription provided for outpatient PT Equipment/Devices: Patient refused home equipment says he has everything that he needs including walker. Consultations: GI, renal Discharge Condition: Stable    CODE STATUS: Full   Diet Recommendation: renal with 1.5liy/day fluid restriction   CC weakness   Brief history of present illness from the day of admission and additional interim summary    Patient is a 50 y.o. male with PMHx of ESRD on HD [name], chronic systolic heart failure, PAF on Eliquis, CAD-numerous PCI's-most recently in December 2021, s/p permanent pacemaker implantation, pseudoseizures, recent Covid positive test on 1/24-presented to [facility] on 2/11 with intermittent chest pain, and subtle left-sided weakness.  CT T-spine showed a T1 vertebral body mass-transfered to [facility] for PPM compatible MRI imaging of the spine.  COVID-19 vaccinated status:  Significant Events: 2/11>> evaluated at [facility] ED for chest pain-left-sided weakness-found to have T1 mass on CT scan T-spine 2/11>> transferred to [facility] [address] for PPM compatible MRI imaging of spine. 2/12>> unresponsive-CODE BLUE-briefly transferred to ICU. Never lost pulse 2/12>>stable-transfer back to telemetry  Significant studies: 2/11>> CT angio chest/abdomen:  No dissection, stable right adrenal myolipoma 2/11>> CT head: No acute intracranial abnormality 2/11>> CT C-spine: No acute C-spine fracture 2/11>> CT T-spine: Soft tissue replacing post T1 body with erosion of the cortex/epidural extension  2/11>> chest x-ray: No acute cardiopulmonary abnormality 2/1 4 >> CT head CTA Head  - Non acute 2/14>> CT T spine with contrast non acute                                                                  Hospital Course   Left-sided weakness: Unclear etiology-CT imaging positive for T1 mass.  He had multiple radiological tests, he was initially scheduled for MRI of the T-spine to rule out T1 mass but this could not be done as he had old pacemaker leads which were abandoned and not deemed to be MRI safe.  He subsequently had T-spine contrast CT scan which was essentially unremarkable and the previous T1 spine mass was thought to be artifact.  He had multiple imagings of his brain including CT head and CT angiogram head on 05/14/2020 including evaluation by the neurologist on 05/14/2020 and 05/15/2020.  His exam is extremely inconsistent  he does have some lower extremity weakness which appears chronic and left more than right however his exam is extremely inconsistent in changing hence some functional component cannot be ruled out.  As it is he is on maximal treatment for A. fib related stroke for which he is taking Eliquis, Brilinta and statin, this combination will be continued.  I discussed his case in detail with Dr. Charlsie Merles neurologist both on 05/14/2020 and 05/15/2020, plan is to discharge him home on his home regimen with outpatient neurology follow-up, of note patient qualified for SNF but he and his wife do not want placement they want him to be discharged home which will be done with home PT OT if patient accepts or else outpatient rehab which patient wants.  He says he has enough equipment and wants to be discharged home immediately.  Of note I did talk to the  wife on phone on 05/15/2020, she was much calmer today and was eager to take patient home.  Still has some left-sided facial droop which per Dr. Charlsie Merles likely is Bell's palsy.  Again outpatient neurology follow-up is highly recommended.  Patient also requested not to drive till cleared by neurologist.    Pseudoseizures/unresponsive episode:   1. See note from Dr. Myna Hidalgo fron 05/12/20- became unresponsive and woke up after 1 chest compression, chart reviewed - has had prior extensive evaluation in the past.  Dr. Myna Hidalgo discussed with neurology on-call-Dr. Bronson Curb reviewed case-and did not feel any further work-up/intervention was needed.  EEG nonacute done January of this year.  2. Similar episode 05/15/19 2.50 pm with unresponsive state and rapidly changing physical exam- Neuro the patient, repeat CT head, CTA head was done, question if this was pseudoseizure, neurological exam for focal deficit highly and reliable per neurologist Dr. Charlsie Merles who had talked to in detail yesterday and today, plan as in above.Marland Kitchen    ESRD on HD [name]: Nephrology is following was dialyzed early on 05/15/2020.  Anemia: Related to ESRD-no indication for transfusion-follow  PAF: Continue amiodarone,metoprolol-resumed Eliquis.  Chronic systolic/diastolic heart failure: Volume status stable-diuresis with HD.  HTN: BP is significantly elevated-resuming amlodipine, Imdur and metoprolol.  CAD: Has extensive history of CAD requiring numerous PCI's--most recent PCI with balloon angioplasty to LAD/RCA on 12/28.  Remains on Brilinta/statin/metoprolol.  Followed by [facility] cardiology.  Peripheral neuropathy: Continue Neurontin  History of gout: Continue allopurinol  History of anxiety/depression: Continue trazodone/Prozac  DM-2 (A1c 8.1 on 1/13): To new home regimen requested to check CBGs before every meal at bedtime log the results in a logbook and follow with PCP in a week.  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  Follow with Primary MD Caryl Never, MD in 7 days  Get CBC, CMP, 2 view Chest X ray -  checked next visit within 1 week by Primary MD    Activity: As tolerated with Full fall precautions use walker/cane & assistance as needed  Disposition Home   Diet: Renal, 1.5 lit/day total fluid restrictoin. Accuchecks 4 times/day, Once in AM empty stomach and then before each meal. Log in all results and show them to your Prim.MD in 3 days. If any glucose reading is under 80 or above 300 call your Prim MD immidiately. Follow Low glucose instructions for glucose under 80 as instructed.    Special Instructions: If you have smoked or chewed Tobacco  in the last 2 yrs please stop smoking, stop any regular Alcohol  and or any Recreational drug use.  On your next visit with your primary care physician please Get Medicines reviewed and adjusted.  Please request your Prim.MD to go over all Hospital Tests and Procedure/Radiological results at the follow up, please get all Hospital records sent to your Prim MD by signing hospital release before you go home.  If you experience worsening of your admission symptoms, develop shortness of breath,  life threatening emergency, suicidal or homicidal thoughts you must seek medical attention immediately by calling 911 or calling your MD immediately  if symptoms less severe.  You Must read complete instructions/literature along with all the possible adverse reactions/side effects for all the Medicines you take and that have been prescribed to you. Take any new Medicines after you have completely understood and accpet all the possible adverse reactions/side effects.   Do not drive when taking Pain medications.  Do not take more than prescribed Pain, Sleep and Anxiety Medications  Wear Seat belts while driving.   Please note  You were cared for by a hospitalist during your hospital stay. If you have any questions about your discharge medications or the care you received while you were in the hospital after you are discharged, you can call the unit and asked to speak with the hospitalist on call if the hospitalist that took care of you is not available. Once you are discharged, your primary care physician will handle any further medical issues. Please note that NO REFILLS for any discharge medications will be authorized once you are discharged, as it is imperative that you return to your primary care physician (or establish a relationship with a primary care physician if you do not have one) for your aftercare needs so that they can reassess your need for medications and monitor your lab values.   Increase activity slowly   Complete by: As directed       Discharge Medications   Allergies as of 05/15/2020      Reactions   Losartan    Hyperkalemia (K>6)   Morphine Anxiety   Morphine And Related Other (See Comments)   Overdose in hospital per patient report      Medication List    TAKE these medications   acetaminophen 500 MG tablet Commonly known as: TYLENOL Take 500-1,000 mg by mouth every 6 (six) hours as needed for mild pain, moderate pain or fever.   albuterol 108 (90 Base) MCG/ACT  inhaler Commonly known as: VENTOLIN HFA Inhale 2 puffs into the lungs every 6 (six) hours as needed for wheezing or shortness of breath.   Alirocumab 150 MG/ML Soaj Inject 150 mg as directed every 14 (fourteen) days.   allopurinol 100 MG tablet Commonly known as: ZYLOPRIM Take 200 mg by mouth daily.   amiodarone 200  MG tablet Commonly known as: PACERONE Take 1 tablet (200 mg total) by mouth 2 (two) times daily.   apixaban 5 MG Tabs tablet Commonly known as: ELIQUIS Take 5 mg by mouth 2 (two) times daily.   atorvastatin 80 MG tablet Commonly known as: LIPITOR Take 80 mg by mouth every evening.   bumetanide 2 MG tablet Commonly known as: BUMEX Take 2 mg by mouth See admin instructions. Take 1 tablet by mouth twice daily ON NON-DIALYSIS DAYS (Sun, Tues, Thurs, Sat)   cholecalciferol 25 MCG (1000 UNIT) tablet Commonly known as: VITAMIN D Take 1,000 Units by mouth daily.   ezetimibe 10 MG tablet Commonly known as: ZETIA Take 10 mg by mouth daily.   FLUoxetine 40 MG capsule Commonly known as: PROZAC Take 80 mg by mouth at bedtime.   gabapentin 100 MG capsule Commonly known as: NEURONTIN Take 1 capsule (100 mg total) by mouth 2 (two) times daily.   insulin detemir 100 UNIT/ML injection Commonly known as: LEVEMIR Inject 0.1 mLs (10 Units total) into the skin at bedtime. What changed: how much to take   isosorbide mononitrate 30 MG 24 hr tablet Commonly known as: IMDUR Take 2 tablets (60 mg total) by mouth daily. Do not take on dialysis days What changed: when to take this   loperamide 2 MG capsule Commonly known as: IMODIUM Take 2 mg by mouth as needed for diarrhea or loose stools.   methocarbamol 500 MG tablet Commonly known as: ROBAXIN Take 500 mg by mouth every 8 (eight) hours as needed for muscle spasms.   metoprolol succinate 100 MG 24 hr tablet Commonly known as: TOPROL-XL Take 100 mg by mouth every Tuesday, Thursday, Saturday, and Sunday.   mirtazapine  7.5 MG tablet Commonly known as: REMERON Take 7.5 mg by mouth at bedtime.   multivitamin with minerals Tabs tablet Take 1 tablet by mouth daily.   nitroGLYCERIN 0.4 MG/SPRAY spray Commonly known as: NITROLINGUAL Place 1 spray under the tongue every 5 (five) minutes x 3 doses as needed for chest pain.   NovoLOG FlexPen 100 UNIT/ML FlexPen Generic drug: insulin aspart Inject 15 Units into the skin 3 (three) times daily with meals.   omeprazole 40 MG capsule Commonly known as: PRILOSEC Take 40 mg by mouth 2 (two) times daily. 30 minutes before meals   ondansetron 4 MG tablet Commonly known as: ZOFRAN Take 4 mg by mouth every 12 (twelve) hours as needed for nausea.   OZEMPIC (0.25 OR 0.5 MG/DOSE) [place] Inject 0.5 mg into the skin every Sunday.   ranolazine 500 MG 12 hr tablet Commonly known as: RANEXA Take 500 mg by mouth 2 (two) times daily.   Renvela 800 MG tablet Generic drug: sevelamer carbonate Take 2,400 mg by mouth 3 (three) times daily with meals.   ticagrelor 90 MG Tabs tablet Commonly known as: BRILINTA Take 90 mg by mouth 2 (two) times daily.   Voltaren 1 % Gel Generic drug: diclofenac Sodium Apply 2 g topically 3 (three) times daily as needed (left leg pain).        Follow-up Information    Gladman, Okey Dupre, MD. Schedule an appointment as soon as possible for a visit in 1 week(s).   Specialty: Internal Medicine Contact information: [place] [address] [phone]        [facility]. Schedule an appointment as soon as possible for a visit in 1 week(s).   Why: Bell's palsy, bilateral  lower extremity weakness question functional Contact information: [address]     [address] [place] [phone]              Major procedures and Radiology Reports - PLEASE review detailed and final reports thoroughly  -       EEG  Result Date: 04/23/2020 Lora Havens, MD     04/23/2020  5:47 PM [name] MRN: [id number] Epilepsy Attending: Lora Havens Referring Physician/Provider: Dr Amie [place] Date: 04/23/2020 Duration: 21.32 mins Patient history: 50yo M with seizure like activity. EEG to evaluate for seizure. Level of alertness: Awake AEDs during EEG study: GBP, LEV Technical aspects: This EEG study was done with scalp electrodes positioned according to the 10-20 International system of electrode placement. Electrical activity was acquired at a sampling rate of '500Hz'$  and reviewed with a high frequency filter of '70Hz'$  and a low frequency filter of '1Hz'$ . EEG data were recorded continuously and digitally stored. Description: The posterior dominant rhythm consists of 8 Hz activity of moderate voltage (25-35 uV) seen predominantly in posterior head regions, symmetric and reactive to eye opening and eye closing. Hyperventilation and photic stimulation were not performed.   IMPRESSION: This study is within normal limits. No seizures or epileptiform discharges were seen throughout the recording. Lora Havens   CT Code Stroke CTA Head W/WO contrast  Result Date: 05/14/2020 CLINICAL DATA:  Neuro deficit, acute stroke suspected. EXAM: CT ANGIOGRAPHY HEAD AND NECK TECHNIQUE: Multidetector CT imaging of the head and neck was performed using the standard protocol during bolus administration of intravenous contrast. Multiplanar CT image reconstructions and MIPs were obtained to evaluate the vascular anatomy. Carotid stenosis measurements (when applicable) are obtained utilizing NASCET criteria, using the distal internal carotid diameter as the denominator. CONTRAST:  72m OMNIPAQUE IOHEXOL 350 MG/ML SOLN COMPARISON:  Same day CT head. FINDINGS: CTA NECK FINDINGS Aortic arch: Great vessel origins are patent. Right carotid system: No evidence of dissection, stenosis (50% or greater) or occlusion. Mild atherosclerosis at the bifurcation. Tortuous ICA with  retropharyngeal course. Left carotid system: No evidence of dissection, stenosis (50% or greater) or occlusion. Mild atherosclerosis at the carotid bifurcation. Tortuous ICA. Vertebral arteries: Limited evaluation proximally secondary to body habitus/beam hardening artifact and streak artifact from concentrated contrast. Particularly limited evaluation of the left vertebral artery proximally, essentially nondiagnostic. Skeleton: No acute abnormality. Please see recent dedicated spine imaging for evaluation spine. Other neck: No mass or suspicious adenopathy. Upper chest: Mild groundglass opacities in the lung apices, possibly representing atelectasis given evidence of expiration. Review of the MIP images confirms the above findings CTA HEAD FINDINGS Anterior circulation: No significant stenosis, proximal occlusion, aneurysm, or vascular malformation. Bilateral cavernous ICA atherosclerosis without evidence of greater than 50% narrowing Posterior circulation: No significant stenosis, proximal occlusion, aneurysm, or vascular malformation. Tortuous left P1 PCA with the left P2 PCA also supplied by a posterior communicating artery Venous sinuses: As permitted by contrast timing, patent. Review of the MIP images confirms the above findings IMPRESSION: 1. No evidence of emergent large vessel occlusion or proximal hemodynamically significant stenosis intracranially. 2. Limited evaluation of the proximal arteries in neck secondary to beam hardening and streak artifact with essentially nondiagnostic evaluation of the proximal left vertebral artery. No visible significant stenosis of the carotid arteries, dominant right vertebral artery or the more distal left vertebral artery. 3. Mild groundglass opacities in the lung apices, possibly representing atelectasis given evidence of expiration on this  study. Dedicated chest imaging could further characterize if clinically indicated. Electronically Signed   By: Margaretha Sheffield  MD   On: 05/14/2020 15:52   DG Chest 1 View  Result Date: 04/29/2020 CLINICAL DATA:  Syncope 2 EXAM: CHEST  1 VIEW COMPARISON:  04/23/2020 FINDINGS: Stable large cardiac silhouette. There is mild central venous congestion improved from prior. Low lung volumes. No pneumothorax. Large dialysis catheter noted. IMPRESSION: 1. Improvement in venous congestion pattern. 2. Cardiomegaly. Electronically Signed   By: Suzy Bouchard M.D.   On: 04/29/2020 15:16   CT HEAD WO CONTRAST  Result Date: 05/14/2020 CLINICAL DATA:  Follow-up. EXAM: CT HEAD WITHOUT CONTRAST TECHNIQUE: Contiguous axial images were obtained from the base of the skull through the vertex without intravenous contrast. COMPARISON:  Comparison made with May 11, 2020 FINDINGS: Brain: No evidence of acute infarction, hemorrhage, hydrocephalus, extra-axial collection or mass lesion/mass effect. Vascular: No hyperdense vessel or unexpected calcification. Skull: Normal. Negative for fracture or focal lesion. Sinuses/Orbits: Visualized paranasal sinuses and orbits are unremarkable. Other: None. IMPRESSION: No acute intracranial abnormality. Electronically Signed   By: Zetta Bills M.D.   On: 05/14/2020 13:25   CT Head Wo Contrast  Result Date: 05/11/2020 CLINICAL DATA:  Recent fall EXAM: CT HEAD WITHOUT CONTRAST TECHNIQUE: Contiguous axial images were obtained from the base of the skull through the vertex without intravenous contrast. COMPARISON:  04/29/2020 FINDINGS: Brain: There is no acute intracranial hemorrhage, mass effect, or edema. Gray-white differentiation is preserved. There is no extra-axial fluid collection. Ventricles and sulci are within normal limits in size and configuration. Vascular: No hyperdense vessel. Minimal atherosclerotic calcification at the skull base. Skull: Calvarium is unremarkable. Sinuses/Orbits: No acute finding. Other: None. IMPRESSION: No evidence of acute intracranial injury. Electronically Signed   By:  Macy Mis M.D.   On: 05/11/2020 11:24   CT Head Wo Contrast  Result Date: 04/29/2020 CLINICAL DATA:  Mental status change.  Syncope EXAM: CT HEAD WITHOUT CONTRAST TECHNIQUE: Contiguous axial images were obtained from the base of the skull through the vertex without intravenous contrast. COMPARISON:  CT head 04/23/2020 FINDINGS: Brain: No evidence of acute infarction, hemorrhage, hydrocephalus, extra-axial collection or mass lesion/mass effect. Vascular: Negative for hyperdense vessel Skull: Negative Sinuses/Orbits: Paranasal sinuses clear.  Negative orbit Other: Motion degraded study IMPRESSION: Negative CT head Electronically Signed   By: Franchot Gallo M.D.   On: 04/29/2020 16:33   CT Head Wo Contrast  Result Date: 04/23/2020 CLINICAL DATA:  Altered mental status.  Hypertension. EXAM: CT HEAD WITHOUT CONTRAST TECHNIQUE: Contiguous axial images were obtained from the base of the skull through the vertex without intravenous contrast. COMPARISON:  April 12, 2020. FINDINGS: Brain: Ventricles and sulci are normal in size and configuration. There is no intracranial mass, hemorrhage, extra-axial fluid collection, midline shift. Brain parenchyma appears unremarkable. No acute infarct evident. Vascular: No hyperdense vessel. There is mild calcification in the right carotid siphon region. Skull: The bony calvarium appears intact. Sinuses/Orbits: Paranasal sinuses are clear. There is a degree of exophthalmos bilaterally. Orbits appear symmetric bilaterally. Other: Mastoid air cells are clear. IMPRESSION: 1.  Normal appearing brain parenchyma.  No mass or hemorrhage. 2. There is a degree of exophthalmos, symmetric. Correlation with thyroid function advised in this regard. 3.  Slight calcification in the right carotid siphon region. Electronically Signed   By: Lowella Grip III M.D.   On: 04/23/2020 09:53   CT Code Stroke CTA Neck W/WO contrast  Result Date: 05/14/2020 CLINICAL DATA:  Neuro  deficit,  acute stroke suspected. EXAM: CT ANGIOGRAPHY HEAD AND NECK TECHNIQUE: Multidetector CT imaging of the head and neck was performed using the standard protocol during bolus administration of intravenous contrast. Multiplanar CT image reconstructions and MIPs were obtained to evaluate the vascular anatomy. Carotid stenosis measurements (when applicable) are obtained utilizing NASCET criteria, using the distal internal carotid diameter as the denominator. CONTRAST:  45m OMNIPAQUE IOHEXOL 350 MG/ML SOLN COMPARISON:  Same day CT head. FINDINGS: CTA NECK FINDINGS Aortic arch: Great vessel origins are patent. Right carotid system: No evidence of dissection, stenosis (50% or greater) or occlusion. Mild atherosclerosis at the bifurcation. Tortuous ICA with retropharyngeal course. Left carotid system: No evidence of dissection, stenosis (50% or greater) or occlusion. Mild atherosclerosis at the carotid bifurcation. Tortuous ICA. Vertebral arteries: Limited evaluation proximally secondary to body habitus/beam hardening artifact and streak artifact from concentrated contrast. Particularly limited evaluation of the left vertebral artery proximally, essentially nondiagnostic. Skeleton: No acute abnormality. Please see recent dedicated spine imaging for evaluation spine. Other neck: No mass or suspicious adenopathy. Upper chest: Mild groundglass opacities in the lung apices, possibly representing atelectasis given evidence of expiration. Review of the MIP images confirms the above findings CTA HEAD FINDINGS Anterior circulation: No significant stenosis, proximal occlusion, aneurysm, or vascular malformation. Bilateral cavernous ICA atherosclerosis without evidence of greater than 50% narrowing Posterior circulation: No significant stenosis, proximal occlusion, aneurysm, or vascular malformation. Tortuous left P1 PCA with the left P2 PCA also supplied by a posterior communicating artery Venous sinuses: As permitted by contrast  timing, patent. Review of the MIP images confirms the above findings IMPRESSION: 1. No evidence of emergent large vessel occlusion or proximal hemodynamically significant stenosis intracranially. 2. Limited evaluation of the proximal arteries in neck secondary to beam hardening and streak artifact with essentially nondiagnostic evaluation of the proximal left vertebral artery. No visible significant stenosis of the carotid arteries, dominant right vertebral artery or the more distal left vertebral artery. 3. Mild groundglass opacities in the lung apices, possibly representing atelectasis given evidence of expiration on this study. Dedicated chest imaging could further characterize if clinically indicated. Electronically Signed   By: FMargaretha SheffieldMD   On: 05/14/2020 15:52   CT Cervical Spine Wo Contrast  Result Date: 05/11/2020 CLINICAL DATA:  Recent fall EXAM: CT CERVICAL SPINE WITHOUT CONTRAST TECHNIQUE: Multidetector CT imaging of the cervical spine was performed without intravenous contrast. Multiplanar CT image reconstructions were also generated. COMPARISON:  None. FINDINGS: There is artifact related to body habitus. Alignment: Anteroposterior alignment is preserved. Skull base and vertebrae: No acute cervical spine fracture. Vertebral body heights are maintained. Soft tissues and spinal canal: No prevertebral fluid or swelling. The spinal canal is obscured below C2-C3. Disc levels: Intervertebral disc heights are maintained. No degenerative stenosis. Upper chest: Chest dated separately. Other: None. IMPRESSION: No acute cervical spine fracture. Electronically Signed   By: PMacy MisM.D.   On: 05/11/2020 11:22   CT THORACIC SPINE W CONTRAST  Result Date: 05/14/2020 CLINICAL DATA:  T-spine mass on previous CT. Unable to undergo MRI due to pacemaker. No given history of malignancy. EXAM: CT THORACIC SPINE WITH CONTRAST TECHNIQUE: Multidetector CT images of thoracic was performed according to the  standard protocol following intravenous contrast administration. CONTRAST:  1022mOMNIPAQUE IOHEXOL 300 MG/ML  SOLN COMPARISON:  Prior CTs of the cervicothoracic spine and chest 05/11/2020. No relevant remote comparison studies. FINDINGS: Alignment: Normal. Vertebrae: Bone detail is mildly limited by body habitus, especially in the upper thoracic  spine. However, there is less artifact than on the recent prior studies, and no definite destructive lesions are identified. Old left-sided rib fractures are noted. Paraspinal and other soft tissues: No paraspinal or epidural abnormalities are identified. There are emphysematous changes and mild dependent atelectasis in both lungs. Cardiac pacemaker noted. Disc levels: Mild thoracic spine degenerative changes. No large disc herniation or spinal stenosis. IMPRESSION: 1. No definite abnormality identified at T1 on the current study. The suggested finding on prior examination Manfred have been artifactual assuming no clinical findings to suggest a thoracic spine lesion. Correlate clinically. 2. Mild thoracic spine degenerative changes. 3. No acute findings. Electronically Signed   By: Richardean Sale M.D.   On: 05/14/2020 13:42   CT T-SPINE NO CHARGE  Result Date: 05/11/2020 CLINICAL DATA:  Recent fall EXAM: CT THORACIC SPINE WITHOUT CONTRAST TECHNIQUE: Multidetector CT images of the thoracic were obtained using the standard protocol without intravenous contrast. COMPARISON:  None. FINDINGS: Artifact is present secondary to body habitus. Alignment: Preserved. Vertebrae: Vertebral body heights are maintained. There is soft tissue replacing a portion of the posterior T1 vertebral body with erosion of cortex and potential epidural extension. There is no acute fracture identified. Paraspinal and other soft tissues: Extra-spinal findings are better evaluated on concurrent dedicated imaging. Disc levels: Intervertebral disc heights are maintained. No significant osseous encroachment  on the spinal canal or neural foramina. IMPRESSION: No acute fracture. Soft tissue replacing part of the posterior T1 vertebral body with erosion of cortex and potential epidural extension. MRI is recommended for further evaluation. Electronically Signed   By: Macy Mis M.D.   On: 05/11/2020 11:39   DG Chest Port 1 View  Result Date: 05/11/2020 CLINICAL DATA:  50 year old male with chest pain while at dialysis. EXAM: PORTABLE CHEST 1 VIEW COMPARISON:  Portable chest 04/29/2020 and earlier. FINDINGS: Portable AP upright view at 0944 hours. Stable left chest AICD, right chest dialysis catheter. Mildly larger, improved lung volumes. Mild cardiomegaly. Other mediastinal contours are within normal limits. Visualized tracheal air column is within normal limits. No pneumothorax. Allowing for portable technique the lungs are clear. No acute osseous abnormality identified. IMPRESSION: No acute cardiopulmonary abnormality. Electronically Signed   By: Genevie Ann M.D.   On: 05/11/2020 10:02   DG Chest Portable 1 View  Result Date: 04/23/2020 CLINICAL DATA:  Altered mental status Hypertensive during dialysis Unresponsive EXAM: PORTABLE CHEST 1 VIEW COMPARISON:  04/12/2020 FINDINGS: Unchanged cardiomegaly. Scattered strandy lung opacities likely due to atelectasis. Dual lead left chest wall AICD unchanged in configuration. Dialysis catheter unchanged in position. IMPRESSION: Unchanged mild cardiomegaly. Electronically Signed   By: Miachel Roux M.D.   On: 04/23/2020 08:12   CT Angio Chest/Abd/Pel for Dissection W and/or Wo Contrast  Result Date: 05/11/2020 CLINICAL DATA:  Chest and abdominal pain. EXAM: CT ANGIOGRAPHY CHEST, ABDOMEN AND PELVIS TECHNIQUE: Non-contrast CT of the chest was initially obtained. Multidetector CT imaging through the chest, abdomen and pelvis was performed using the standard protocol during bolus administration of intravenous contrast. Multiplanar reconstructed images and MIPs were obtained  and reviewed to evaluate the vascular anatomy. CONTRAST:  178m OMNIPAQUE IOHEXOL 350 MG/ML SOLN COMPARISON:  July 08, 2018. FINDINGS: CTA CHEST FINDINGS Cardiovascular: Preferential opacification of the thoracic aorta. No evidence of thoracic aortic aneurysm or dissection. Mild cardiomegaly is noted. Coronary artery calcifications are noted. No pericardial effusion. Mediastinum/Nodes: No enlarged mediastinal, hilar, or axillary lymph nodes. Thyroid gland, trachea, and esophagus demonstrate no significant findings. Lungs/Pleura: Lungs are clear. No  pleural effusion or pneumothorax. Musculoskeletal: No chest wall abnormality. No acute or significant osseous findings. Review of the MIP images confirms the above findings. CTA ABDOMEN AND PELVIS FINDINGS VASCULAR Aorta: Atherosclerosis of abdominal aorta is noted without aneurysm or dissection. Celiac: Patent without evidence of aneurysm, dissection, vasculitis or significant stenosis. SMA: Patent without evidence of aneurysm, dissection, vasculitis or significant stenosis. Renals: 2 renal arteries are noted on each side, and there is no evidence of significant stenosis or thrombosis present. IMA: Patent without evidence of aneurysm, dissection, vasculitis or significant stenosis. Inflow: Patent without evidence of aneurysm, dissection, vasculitis or significant stenosis. Veins: No obvious venous abnormality within the limitations of this arterial phase study. Review of the MIP images confirms the above findings. NON-VASCULAR Hepatobiliary: No focal liver abnormality is seen. No gallstones, gallbladder wall thickening, or biliary dilatation. Pancreas: Unremarkable. No pancreatic ductal dilatation or surrounding inflammatory changes. Spleen: Normal in size without focal abnormality. Adrenals/Urinary Tract: Stable right adrenal myelolipoma. Left adrenal gland is unremarkable. Bilateral renal atrophy is noted. Stable left renal cyst is noted. No hydronephrosis or renal  obstruction is noted. No renal or ureteral calculi are noted. Urinary bladder is unremarkable. Stomach/Bowel: Stomach is within normal limits. Appendix appears normal. No evidence of bowel wall thickening, distention, or inflammatory changes. Lymphatic: No significant adenopathy is noted. Reproductive: Prostate is unremarkable. Other: No abdominal wall hernia or abnormality. No abdominopelvic ascites. Musculoskeletal: No acute or significant osseous findings. Review of the MIP images confirms the above findings. IMPRESSION: 1. No evidence of thoracic or abdominal aortic aneurysm or dissection. 2. Coronary artery calcifications are noted suggesting coronary artery disease. 3. Bilateral renal atrophy is noted. 4. Stable right adrenal myelolipoma. 5. No acute abnormality seen in the chest, abdomen or pelvis. 6. Aortic atherosclerosis. Aortic Atherosclerosis (ICD10-I70.0). Electronically Signed   By: Marijo Conception M.D.   On: 05/11/2020 11:28    Micro Results     No results found for this or any previous visit (from the past 240 hour(s)).  Today   Subjective    Edwin [name] today has no headache,no chest abdominal pain,no new weakness tingling or numbness, feels much better wants to go home today, refuses SNF.     Objective   Blood pressure (!) 183/102, pulse 70, temperature 97.7 F (36.5 C), temperature source Oral, resp. rate 19, weight (!) 158.5 kg, SpO2 100 %.   Intake/Output Summary (Last 24 hours) at 05/15/2020 1413 Last data filed at 05/15/2020 0456 Gross per 24 hour  Intake 500 ml  Output 800 ml  Net -300 ml    Exam  Awake Alert, mild L sided facial droop, leg strength 4/5 [place].AT,PERRAL Supple Neck,No JVD, No cervical lymphadenopathy appriciated.  Symmetrical Chest wall movement, Good air movement bilaterally, CTAB RRR,No Gallops,Rubs or new Murmurs, No Parasternal Heave +ve B.Sounds, Abd Soft, Non tender, No organomegaly appriciated, No rebound -guarding or rigidity. No Cyanosis,  Clubbing or edema, No new Rash or bruise   Data Review   CBC w Diff:  Lab Results  Component Value Date   WBC 6.3 05/14/2020   HGB 11.4 (L) 05/14/2020   HCT 35.9 (L) 05/14/2020   PLT 225 05/14/2020   LYMPHOPCT 11 05/12/2020   MONOPCT 7 05/12/2020   EOSPCT 1 05/12/2020   BASOPCT 0 05/12/2020    CMP:  Lab Results  Component Value Date   NA 136 05/14/2020   K 4.7 05/14/2020   CL 103 05/14/2020   CO2 21 (L) 05/14/2020   BUN 38 (H)  05/14/2020   CREATININE 6.38 (H) 05/14/2020   PROT 6.6 05/12/2020   ALBUMIN 2.9 (L) 05/14/2020   BILITOT 0.9 05/12/2020   ALKPHOS 89 05/12/2020   AST 11 (L) 05/12/2020   ALT 11 05/12/2020  .   Total Time in preparing paper work, data evaluation and todays exam - 41 minutes  Lala Lund M.D on 05/15/2020 at 2:13 PM  [facility]

## 2020-05-15 NOTE — Progress Notes (Signed)
Pt continues to report left sided weakness and numbness in left lower extremity. Pt was able to lift leg and resist pressure  but states that it still feels numb. Pt reported SOB however upon assessment, respirations slowed down after reassurance. Lung sounds clear. Pt stating at 100% room air. Respirations = 19. Pt is upset and anxious with frequent moments of tearfulness about not having answers for what is going on with him.

## 2020-05-15 NOTE — Evaluation (Signed)
Physical Therapy Evaluation Patient Details Name: Edwin [name] MRN: [id number] DOB: [birth year] Today's Date: 05/15/2020   History of Present Illness  Patient is a 50 y.o. male with PMHx of ESRD on HD [name], chronic systolic heart failure, PAF on Eliquis, CAD-numerous PCI's-most recently in December 2021, s/p permanent pacemaker implantation, pseudoseizures, recent Covid positive test on 1/24-presented to [facility] on 2/11 with intermittent chest pain, and subtle left-sided weakness.  CT T-spine showed a T1 vertebral body mass. Repeat CT thorax showed no such finding after contrast administration. Pt also reporting left sided symptoms including weakness and numbness and facial droop. Cannot have brain MRI due to incompatible pacer. CT head obtained and no indication of new right hemisphere stroke. No ICH.  Clinical Impression  Pt presents with decreased functional mobility secondary to gross weakness, obesity, left foot drop (chronic), decreased sensation distal LLE, decreased coordination, gait abnormalities, poor standing balance, decreased activity tolerance, and cognitive deficits. Pt requiring up to two person moderate assist to stand from lower surfaces. Ambulating 10 ft to sink with a walker; once standing at sink, pt with brief period of decreased responsiveness and lowered to chair. BP 175/100; repeat standing again from chair 186/97. When walking back to the bed, pt suddenly reporting his legs were "giving away," and reaching out for bed. PT/OT called for help to bring chair behind pt to prevent fall. Pt presents as a high fall risk based on above deficits. Recommending post acute rehab.    Follow Up Recommendations CIR;Supervision/Assistance - 24 hour (pt refusing)    Equipment Recommendations  None recommended by PT (pt has needed DME)   Recommendations for Other Services       Precautions / Restrictions Precautions Precautions: Fall;Other (comment) Precaution Comments: High fall risk,  left foot drop Restrictions Weight Bearing Restrictions: No      Mobility  Bed Mobility Overal bed mobility: Needs Assistance Bed Mobility: Supine to Sit;Sit to Supine     Supine to sit: Min guard Sit to supine: Min guard   General bed mobility comments: Pt using arms to progress BLE's on and off the bed    Transfers Overall transfer level: Needs assistance Equipment used: Rolling walker (2 wheeled);Ambulation equipment used Transfers: Sit to/from Stand Sit to Stand: Mod assist;+2 physical assistance;Min assist         General transfer comment: Pt requiring minA to rise from elevated bed height; modA + 2 to rise from lower surface of chair and use of sink to pull up to standing. Utilized Stedy towards end of session to return to bed  Ambulation/Gait Ambulation/Gait assistance: Mod assist;+2 physical assistance Gait Distance (Feet): 10 Feet (10", 5") Assistive device: Rolling walker (2 wheeled) Gait Pattern/deviations: Step-through pattern;Decreased dorsiflexion - left;Decreased stride length;Wide base of support Gait velocity: decreased Gait velocity interpretation: <1.31 ft/sec, indicative of household ambulator General Gait Details: Max cues for walker proximity, sequencing/direction, and controlling pace. Pt with decreased left heel strike due to chronic foot drop and circumduction. Requiring modA + 2 for stability  Stairs            Wheelchair Mobility    Modified Rankin (Stroke Patients Only)       Balance Overall balance assessment: Needs assistance Sitting-balance support: Feet supported Sitting balance-Leahy Scale: Fair     Standing balance support: Bilateral upper extremity supported Standing balance-Leahy Scale: Poor Standing balance comment: reliant on BUE support  Pertinent Vitals/Pain Pain Assessment: No/denies pain    Home Living Family/patient expects to be discharged to:: Private  residence Living Arrangements: Spouse/significant other Available Help at Discharge: Family;Available 24 hours/day Type of Home: House Home Access: Stairs to enter Entrance Stairs-Rails: Right;Left;Can reach both Entrance Stairs-Number of Steps: 2 Home Layout: One level Home Equipment: Walker - 4 wheels;Cane - single point;Bedside commode;Walker - 2 wheels;Shower seat;Wheelchair - manual Additional Comments: Previously had L foot AFO, but created wound so threw it away    Prior Function Level of Independence: Independent with assistive device(s)         Comments: Uses cane, household ambulator     Hand Dominance        Extremity/Trunk Assessment   Upper Extremity Assessment Upper Extremity Assessment: Defer to OT evaluation    Lower Extremity Assessment Lower Extremity Assessment: RLE deficits/detail;LLE deficits/detail RLE Deficits / Details: Hip flexion 2/5, knee extension 5/5, ankle dorsiflexion 5/5 LLE Deficits / Details: Hip flexion 2/5, knee extension 5/5, ankle dorsiflexion 0/5    Cervical / Trunk Assessment Cervical / Trunk Assessment: Other exceptions Cervical / Trunk Exceptions: increased body habitus  Communication   Communication: No difficulties  Cognition Arousal/Alertness: Awake/alert Behavior During Therapy: WFL for tasks assessed/performed Overall Cognitive Status: Impaired/Different from baseline Area of Impairment: Safety/judgement                         Safety/Judgement: Decreased awareness of safety;Decreased awareness of deficits     General Comments: Poor awareness of safety/deficits      General Comments      Exercises     Assessment/Plan    PT Assessment Patient needs continued PT services  PT Problem List Decreased strength;Decreased activity tolerance;Decreased balance;Decreased mobility;Decreased knowledge of use of DME       PT Treatment Interventions DME instruction;Balance training;Gait training;Neuromuscular  re-education;Stair training;Functional mobility training;Patient/family education;Therapeutic activities;Therapeutic exercise    PT Goals (Current goals can be found in the Care Plan section)  Acute Rehab PT Goals Patient Stated Goal: to go home, not have facial droop PT Goal Formulation: With patient Time For Goal Achievement: 05/29/20 Potential to Achieve Goals: Good    Frequency Min 3X/week   Barriers to discharge Inaccessible home environment      Co-evaluation PT/OT/SLP Co-Evaluation/Treatment: Yes Reason for Co-Treatment: For patient/therapist safety;To address functional/ADL transfers PT goals addressed during session: Mobility/safety with mobility         AM-PAC PT "6 Clicks" Mobility  Outcome Measure Help needed turning from your back to your side while in a flat bed without using bedrails?: A Little Help needed moving from lying on your back to sitting on the side of a flat bed without using bedrails?: A Little Help needed moving to and from a bed to a chair (including a wheelchair)?: A Lot Help needed standing up from a chair using your arms (e.g., wheelchair or bedside chair)?: A Lot Help needed to walk in hospital room?: A Lot Help needed climbing 3-5 steps with a railing? : Total 6 Click Score: 13    End of Session Equipment Utilized During Treatment: Gait belt Activity Tolerance: Patient limited by fatigue Patient left: in bed;with call bell/phone within reach;with bed alarm set Nurse Communication: Mobility status PT Visit Diagnosis: Muscle weakness (generalized) (M62.81);Other abnormalities of gait and mobility (R26.89)    Time: [id number] PT Time Calculation (min) (ACUTE ONLY): 42 min   Charges:   PT Evaluation $PT Eval Moderate Complexity: 1 Mod PT Treatments $  Gait Training: 8-22 mins        Wyona Almas, [place], DPT Acute Rehabilitation Services Pager [phone] Office [phone]   Edwin [name] 05/15/2020, 2:21 PM

## 2020-05-15 NOTE — Progress Notes (Signed)
Neurology Progress Note  S: States he still has facial droop and is weak in his legs, worse on the left. We were asked to take another look at patient today for his continued left facial droop in absence of an acute stroke. Patient denies a history of Bell's palsy. He does state that he had Covid about 3 weeks ago. He received antibodies 3 days after diagnosis. Later, he felt his legs were weaker, his face was drooping, fell, and came to the ED.   O: Current vital signs: BP (!) 183/102 (BP Location: Right Arm)   Pulse 70   Temp 97.7 F (36.5 C) (Oral)   Resp 19   Wt (!) 158.5 kg   SpO2 100%   BMI 44.86 kg/m  Vital signs in last 24 hours: Temp:  [97.4 F (36.3 C)-98.7 F (37.1 C)] 97.7 F (36.5 C) (02/15 1222) Pulse Rate:  [69-75] 70 (02/15 1222) Resp:  [12-20] 19 (02/15 1222) BP: (78-183)/(53-103) 183/102 (02/15 1222) SpO2:  [95 %-100 %] 100 % (02/15 1222) Weight:  [157.3 kg-159.5 kg] 158.5 kg (02/15 1012)  GENERAL: Awake, alert in NAD HEENT: Normocephalic and atraumatic, dry mm LUNGS: Normal respiratory effort.  CV: RRR ABDOMEN: Soft, nontender Ext: warm  NEURO:  Mental Status: AA&Ox3  Speech/Language: speech is without dysarthria or aphasia. Naming, repetition, fluency, and comprehension intact.  Cranial Nerves:  II: PERRL. Visual fields full.  III, IV, VI: EOMI. OS eye lid weaker than OD.   V: Sensation is intact to light touch and to face but decreased to the V2 distribution on the left.   VII: left facial droop, lower face. Able to puff cheeks. When lifts eyebrows, the left one does not rise. He has no wrinkles in his left forehead.   VIII: hearing intact to voice. IX, X: Palate elevates symmetrically. Phonation is normal.  [id number] shrug 5/5. XII: tongue is midline without fasciculations. Motor: 5/5 strength to RUE and LUE. BLE 4/5 with no effort against gravity to left plantar flexion or dorsiflexion (hx foot drop)  Tone: is normal and bulk is  normal Sensation-sensation to light touch in all 4 extremites symmetrically. Vibratory reveals no feeling up to left knee. RLE vibratory sense is intact. RUE is normal. LUE slightly different than RUE. Cold senstaion is lessened to LLE to mid tibia, but intact elsewhere.    Coordination: FTN intact bilaterally, HKS unable to perform due to leg weakness.   DTRs: 1+ RUE brachioradialis, 0 - 1+ LUE  brachioradialis, 1+ LLE patella, 0 RLE patella.  Gait- deferred. Having HD.   Medications  Current Facility-Administered Medications:  .  acetaminophen (TYLENOL) tablet 650 mg, 650 mg, Oral, Q6H PRN, 650 mg at 05/15/20 1159 **OR** [DISCONTINUED] acetaminophen (TYLENOL) suppository 650 mg, 650 mg, Rectal, Q6H PRN, Garba, Mohammad L, MD .  albuterol (PROVENTIL) (2.5 MG/3ML) 0.083% nebulizer solution 2.5 mg, 2.5 mg, Nebulization, Q6H PRN, Ghimire, Shanker M, MD .  allopurinol (ZYLOPRIM) tablet 200 mg, 200 mg, Oral, Daily, Ghimire, Shanker M, MD, 200 mg at 05/15/20 1153 .  amiodarone (PACERONE) tablet 200 mg, 200 mg, Oral, BID, Ghimire, Henreitta Leber, MD, 200 mg at 05/15/20 1154 .  amLODipine (NORVASC) tablet 10 mg, 10 mg, Oral, Daily, Gleason, Otilio Carpen, PA-C, 10 mg at 05/15/20 1153 .  apixaban (ELIQUIS) tablet 5 mg, 5 mg, Oral, BID, Ghimire, Henreitta Leber, MD, 5 mg at 05/15/20 1155 .  atorvastatin (LIPITOR) tablet 80 mg, 80 mg, Oral, QPM, Ghimire, Henreitta Leber, MD, 80 mg at 05/14/20 1706 .  calcium carbonate (dosed in mg elemental calcium) suspension 500 mg of elemental calcium, 500 mg of elemental calcium, Oral, Q6H PRN, Jonelle Sidle, Mohammad L, MD .  Chlorhexidine Gluconate Cloth 2 % PADS 6 each, 6 each, Topical, Daily, Elwyn Reach, MD, 6 each at 05/14/20 2306 .  Chlorhexidine Gluconate Cloth 2 % PADS 6 each, 6 each, Topical, Q0600, Roney Jaffe, MD .  feeding supplement (NEPRO CARB STEADY) liquid 237 mL, 237 mL, Oral, TID PRN, Jonelle Sidle, Mohammad L, MD .  FLUoxetine (PROZAC) capsule 80 mg, 80 mg, Oral, Daily,  Ghimire, Henreitta Leber, MD, 80 mg at 05/15/20 1154 .  gabapentin (NEURONTIN) capsule 100 mg, 100 mg, Oral, BID, Ghimire, Henreitta Leber, MD, 100 mg at 05/15/20 1155 .  heparin sodium (porcine) 1000 UNIT/ML injection, , , ,  .  insulin aspart (novoLOG) injection 0-5 Units, 0-5 Units, Subcutaneous, QHS, Garba, Mohammad L, MD .  insulin aspart (novoLOG) injection 0-6 Units, 0-6 Units, Subcutaneous, TID WC, Elwyn Reach, MD, 1 Units at 05/14/20 1155 .  insulin detemir (LEVEMIR) injection 10 Units, 10 Units, Subcutaneous, QHS, Ghimire, Henreitta Leber, MD, 10 Units at 05/14/20 2241 .  isosorbide mononitrate (IMDUR) 24 hr tablet 60 mg, 60 mg, Oral, Once per day on Sun Tue Thu Sat, Ghimire, Shanker M, MD, 60 mg at 05/15/20 1200 .  labetalol (NORMODYNE) injection 5 mg, 5 mg, Intravenous, Q6H PRN, Gleason, Otilio Carpen, PA-C, 5 mg at 05/12/20 [id number] .  metoprolol succinate (TOPROL-XL) 24 hr tablet 100 mg, 100 mg, Oral, Daily, Ghimire, Henreitta Leber, MD, 100 mg at 05/15/20 1154 .  [DISCONTINUED] ondansetron (ZOFRAN) tablet 4 mg, 4 mg, Oral, Q6H PRN **OR** ondansetron (ZOFRAN) injection 4 mg, 4 mg, Intravenous, Q6H PRN, Garba, Mohammad L, MD .  oxyCODONE-acetaminophen (PERCOCET/ROXICET) 5-325 MG per tablet 1 tablet, 1 tablet, Oral, Q6H PRN, Thurnell Lose, MD .  pantoprazole (PROTONIX) EC tablet 40 mg, 40 mg, Oral, Daily, Ghimire, Henreitta Leber, MD, 40 mg at 05/15/20 1154 .  ranolazine (RANEXA) 12 hr tablet 500 mg, 500 mg, Oral, BID, Ghimire, Henreitta Leber, MD, 500 mg at 05/15/20 1153 .  sevelamer carbonate (RENVELA) tablet 1,600 mg, 1,600 mg, Oral, TID WC, Roney Jaffe, MD, 1,600 mg at 05/15/20 1153 .  sodium chloride (OCEAN) 0.65 % nasal spray 1 spray, 1 spray, Each Nare, PRN, Candiss Norse, Prashant K, MD .  sorbitol 70 % solution 30 mL, 30 mL, Oral, PRN, Jonelle Sidle, Mohammad L, MD .  ticagrelor (BRILINTA) tablet 90 mg, 90 mg, Oral, BID, Ghimire, Henreitta Leber, MD, 90 mg at 05/15/20 1154 .  traZODone (DESYREL) tablet 50 mg, 50 mg, Oral, QHS,  Blenda Nicely, RPH, 50 mg at 05/14/20 2240  Pertinent Labs Hemoglobin A1c   8.3                    LDL 148                                     TSH    5.446      Imaging MD has reviewed images in epic and the results pertinent to this consultation are:  CT Head No acute abnormality  CTA head and neck  1. No evidence of emergent large vessel occlusion or proximal hemodynamically significant stenosis intracranially. 2. Limited evaluation of the proximal arteries in neck secondary to beam hardening and streak artifact with essentially nondiagnostic evaluation of the proximal left vertebral artery.  No visible significant stenosis of the carotid arteries, dominant right vertebral artery or the more distal left vertebral artery. 3. Mild groundglass opacities in the lung apices, possibly representing atelectasis given evidence of expiration on this study. Dedicated chest imaging could further characterize if clinically indicated  Assessment: 50 yo male with multiple chronic medical issues who presented [facility] ED [facility] for back pain and SOB. He was seen as a code stroke 05/14/20 here. He was receiving maximal medical management with Eliquis for PAF and ASA. Unable to have MRI brain due to incompatible pacemaker. In code stroke imaging, there was no clear indication of new right hemisphere or brainstem stroke. He was observed to have variable responsiveness, shaking and seizure like activity with prior history of pseudoseizures.   Impression/Plan:  1. Functional neurological disorder and f/up with psychiatry is recommended  2. Bell's palsy-steroids not indicated and especially in this patient with uncontrolled DM. There is no evidence of CN 7 palsy. No true evidence of Wallenberg's syndrome given he does not have lack of vibratory sense on one side which coincides with decreased cold sensitivity to the other side.  3. Generalized deconditioning-HHPT per primary MD.    Patient seen by Clance Boll, MSN, APN-BC/Nurse Practitioner/Neuro. Plan was discussed and approved by MD.

## 2020-05-15 NOTE — Progress Notes (Signed)
SLP Cancellation Note  Patient Details Name: Edwin [name] MRN: [id number] DOB: 21-Sep-1970   Cancelled treatment:       Reason Eval/Treat Not Completed: Patient at procedure or test/unavailable (HD). Will f/u for swallowing eval as able once out of dialysis. Note that pt did pass the [facility] swallow screen on previous date.     Osie Bond., M.A. [facility] Pager [phone] Office [phone]  05/15/2020, 10:03 AM

## 2020-05-15 NOTE — Evaluation (Signed)
Clinical/Bedside Swallow Evaluation Patient Details  Name: Edwin [name] MRN: [id number] Date of Birth: 28-Feb-1971  Today's Date: 05/15/2020 Time: SLP Start Time (ACUTE ONLY): [id number] SLP Stop Time (ACUTE ONLY): 1408 SLP Time Calculation (min) (ACUTE ONLY): 16 min  Past Medical History:  Past Medical History:  Diagnosis Date  . CAD (coronary artery disease)   . Chronic systolic heart failure ([place])   . Diabetes mellitus without complication ([place])   . Heart attack ([place])   . Hyperlipidemia   . Hypertension   . Ischemic cardiomyopathy   . Paroxysmal atrial fibrillation ([name])   . Renal disorder    Past Surgical History:  Past Surgical History:  Procedure Laterality Date  . [employer] pacemaker    . CARDIAC SURGERY    . IRRIGATION AND DEBRIDEMENT KNEE Left 12/17/2017   Procedure: LEFT KNEE DEBRIDEMENT AND SYNOVECTOMY;  Surgeon: Thornton Park, MD;  Location: [facility] ORS;  Service: Orthopedics;  Laterality: Left;  . JOINT REPLACEMENT    . LEFT HEART CATH AND CORONARY ANGIOGRAPHY Left 03/09/2018   Procedure: LEFT HEART CATH AND CORONARY ANGIOGRAPHY;  Surgeon: Nelva Bush, MD;  Location: [place] [facility];  Service: Cardiovascular;  Laterality: Left;  . LEFT HEART CATH AND CORONARY ANGIOGRAPHY N/A 01/25/2020   Procedure: LEFT HEART CATH AND CORONARY ANGIOGRAPHY;  Surgeon: Isaias Cowman, MD;  Location: [place] [facility];  Service: Cardiovascular;  Laterality: N/A;  . PERCUTANEOUS CORONARY STENT INTERVENTION (PCI-S)     HPI:  Pt is a 50 y.o. male who presented to the emergency department from dialysis for unresponsiveness and seizure-like activity. Acute L sided weakness also noted. CTA from 2/14 showed mild groundglass opacities in the lung apices, possibly representing atelectasis. CT Head from 2/14 was negative for acute intracranial abnormality. PMH includes ischemic cardiomyophathy, HTN, hyperlipidemia, DM, CAD.   Assessment / Plan / Recommendation Clinical  Impression  No overt s/s of aspiration were noted during all PO trials. Pt reported buccal pocketing of solids; however, this was not present during today's evaluation due to use of compenstory strategies with Mod I. Provided education for compensatory strategies given pt's left sided droop, such as continued use of lingual sweep. Recomend regular diet and thin liquids - SLP will f/u if pt does not discharge today for further examination of current diet using compensatory strategies.   SLP Visit Diagnosis: Dysphagia, unspecified (R13.10)    Aspiration Risk  Mild aspiration risk    Diet Recommendation Regular;Thin liquid   Liquid Administration via: Straw Medication Administration: Whole meds with liquid Supervision: Patient able to self feed;Intermittent supervision to cue for compensatory strategies Compensations: Slow rate;Small sips/bites;Lingual sweep for clearance of pocketing Postural Changes: Seated upright at 90 degrees;Remain upright for at least 30 minutes after po intake    Other  Recommendations Oral Care Recommendations: Oral care BID   Follow up Recommendations 24 hour supervision/assistance;Inpatient Rehab      Frequency and Duration min 1 x/week  1 week       Prognosis Prognosis for Safe Diet Advancement: Good      Swallow Study   General HPI: Pt is a 50 y.o. male who presented to the emergency department from dialysis for unresponsiveness and seizure-like activity. Acute L sided weakness also noted. CTA from 2/14 showed mild groundglass opacities in the lung apices, possibly representing atelectasis. CT Head from 2/14 was negative for acute intracranial abnormality. PMH includes ischemic cardiomyophathy, HTN, hyperlipidemia, DM, CAD. Type of Study: Bedside Swallow Evaluation Previous Swallow Assessment: None in chart Diet  Prior to this Study: Regular;Thin liquids Temperature Spikes Noted: No Respiratory Status: Room air History of Recent Intubation:  No Behavior/Cognition: Alert;Cooperative;Pleasant mood Oral Cavity Assessment: Within Functional Limits Oral Care Completed by SLP: No Oral Cavity - Dentition: Adequate natural dentition Vision: Functional for self-feeding Self-Feeding Abilities: Able to feed self Patient Positioning: Upright in bed Baseline Vocal Quality: Normal Volitional Cough: Strong Volitional Swallow: Able to elicit    Oral/Motor/Sensory Function Overall Oral Motor/Sensory Function: Moderate impairment Facial ROM: Reduced left;Suspected CN VII (facial) dysfunction Facial Symmetry: Abnormal symmetry left;Suspected CN VII (facial) dysfunction Facial Strength: Reduced left;Suspected CN VII (facial) dysfunction Facial Sensation: Reduced left;Suspected CN V (Trigeminal) dysfunction Lingual ROM: Within Functional Limits Lingual Symmetry: Within Functional Limits Lingual Strength: Within Functional Limits Lingual Sensation: Within Functional Limits Velum: Within Functional Limits Mandible: Within Functional Limits   Ice Chips Ice chips: Not tested   Thin Liquid Thin Liquid: Within functional limits Presentation: Straw    Nectar Thick Nectar Thick Liquid: Not tested   Honey Thick Honey Thick Liquid: Not tested   Puree Puree: Within functional limits Presentation: Self Fed   Solid     Solid: Within functional limits Presentation: [place] S., SLP Student 05/15/2020,2:33 PM

## 2020-05-15 NOTE — Progress Notes (Signed)
Pt taken to HD at this time. Medications have not been given.

## 2020-05-16 ENCOUNTER — Encounter
Admit: 2020-05-16 | Discharge: 2020-05-29 | Disposition: E | Payer: MEDICARE | Attending: Student in an Organized Health Care Education/Training Program

## 2020-05-16 ENCOUNTER — Ambulatory Visit: Admit: 2020-05-16 | Discharge: 2020-05-29 | Disposition: E | Payer: MEDICARE

## 2020-05-16 LAB — CBC W/ AUTO DIFF
BASOPHILS ABSOLUTE COUNT: 0 10*9/L (ref 0.0–0.1)
BASOPHILS RELATIVE PERCENT: 0.7 %
EOSINOPHILS ABSOLUTE COUNT: 0.2 10*9/L (ref 0.0–0.7)
EOSINOPHILS RELATIVE PERCENT: 2.1 %
HEMATOCRIT: 37.3 % — ABNORMAL LOW (ref 38.0–50.0)
HEMOGLOBIN: 12.3 g/dL — ABNORMAL LOW (ref 13.5–17.5)
LYMPHOCYTES ABSOLUTE COUNT: 0.8 10*9/L (ref 0.7–4.0)
LYMPHOCYTES RELATIVE PERCENT: 10.9 %
MEAN CORPUSCULAR HEMOGLOBIN CONC: 32.9 g/dL (ref 30.0–36.0)
MEAN CORPUSCULAR HEMOGLOBIN: 28.5 pg (ref 26.0–34.0)
MEAN CORPUSCULAR VOLUME: 86.6 fL (ref 81.0–95.0)
MEAN PLATELET VOLUME: 8.3 fL (ref 7.0–10.0)
MONOCYTES ABSOLUTE COUNT: 0.5 10*9/L (ref 0.1–1.0)
MONOCYTES RELATIVE PERCENT: 7.4 %
NEUTROPHILS ABSOLUTE COUNT: 5.5 10*9/L (ref 1.7–7.7)
NEUTROPHILS RELATIVE PERCENT: 78.9 %
NUCLEATED RED BLOOD CELLS: 0 /100{WBCs} (ref <=4)
PLATELET COUNT: 241 10*9/L (ref 150–450)
RED BLOOD CELL COUNT: 4.31 10*12/L — ABNORMAL LOW (ref 4.32–5.72)
RED CELL DISTRIBUTION WIDTH: 17.2 % — ABNORMAL HIGH (ref 12.0–15.0)
WBC ADJUSTED: 7 10*9/L (ref 3.5–10.5)

## 2020-05-16 LAB — COMPREHENSIVE METABOLIC PANEL
ALBUMIN: 3.2 g/dL — ABNORMAL LOW (ref 3.4–5.0)
ALKALINE PHOSPHATASE: 120 U/L — ABNORMAL HIGH (ref 46–116)
ALT (SGPT): 7 U/L — ABNORMAL LOW (ref 10–49)
ANION GAP: 10 mmol/L (ref 5–14)
AST (SGOT): 19 U/L (ref <=34)
BILIRUBIN TOTAL: 0.2 mg/dL — ABNORMAL LOW (ref 0.3–1.2)
BLOOD UREA NITROGEN: 36 mg/dL — ABNORMAL HIGH (ref 9–23)
BUN / CREAT RATIO: 5
CALCIUM: 8.8 mg/dL (ref 8.7–10.4)
CHLORIDE: 104 mmol/L (ref 98–107)
CO2: 23.5 mmol/L (ref 20.0–31.0)
CREATININE: 6.55 mg/dL — ABNORMAL HIGH
EGFR CKD-EPI AA MALE: 10 mL/min/{1.73_m2} — ABNORMAL LOW (ref >=60)
EGFR CKD-EPI NON-AA MALE: 9 mL/min/{1.73_m2} — ABNORMAL LOW (ref >=60)
GLUCOSE RANDOM: 132 mg/dL (ref 70–179)
POTASSIUM: 4.9 mmol/L — ABNORMAL HIGH (ref 3.4–4.5)
PROTEIN TOTAL: 7.4 g/dL (ref 5.7–8.2)
SODIUM: 137 mmol/L (ref 135–145)

## 2020-05-16 LAB — URINALYSIS WITH CULTURE REFLEX
BILIRUBIN UA: NEGATIVE
GLUCOSE UA: 250 — AB
HYALINE CASTS: 2 /LPF — ABNORMAL HIGH (ref <=0)
KETONES UA: NEGATIVE
LEUKOCYTE ESTERASE UA: NEGATIVE
NITRITE UA: NEGATIVE
PH UA: 8.5 (ref 5.0–9.0)
PROTEIN UA: >300 — AB
RBC UA: 10 /HPF — ABNORMAL HIGH (ref <3)
SPECIFIC GRAVITY UA: 1.02 (ref 1.005–1.040)
SQUAMOUS EPITHELIAL: <1 /HPF (ref 0–5)
UROBILINOGEN UA: 0.2
WBC UA: 5 /HPF — ABNORMAL HIGH (ref <2)

## 2020-05-16 LAB — TOXICOLOGY SCREEN, URINE
AMPHETAMINE SCREEN URINE: NEGATIVE
BARBITURATE SCREEN URINE: NEGATIVE
BENZODIAZEPINE SCREEN, URINE: NEGATIVE
BUPRENORPHINE, URINE SCREEN: NEGATIVE
CANNABINOID SCREEN URINE: NEGATIVE
COCAINE(METAB.)SCREEN, URINE: NEGATIVE
FENTANYL SCREEN, URINE: NEGATIVE
METHADONE SCREEN, URINE: NEGATIVE
OPIATE SCREEN URINE: NEGATIVE
OXYCODONE SCREEN URINE: POSITIVE — AB

## 2020-05-16 LAB — HIGH SENSITIVITY TROPONIN I - 2 HOUR SERIAL
HIGH SENSITIVITY TROPONIN - DELTA (0-2H): 1 ng/L (ref <=7)
HIGH-SENSITIVITY TROPONIN I - 2 HOUR: 22 ng/L (ref <=53)

## 2020-05-16 LAB — LIPASE: LIPASE: 26 U/L (ref 12–53)

## 2020-05-16 LAB — LACTATE, VENOUS, WHOLE BLOOD: LACTATE BLOOD VENOUS: 1.3 mmol/L (ref 0.5–1.8)

## 2020-05-16 LAB — HIGH SENSITIVITY TROPONIN I - SERIAL: HIGH SENSITIVITY TROPONIN I: 23 ng/L (ref <=53)

## 2020-05-16 LAB — HIGH SENSITIVITY TROPONIN I - 6 HOUR SERIAL
HIGH SENSITIVITY TROPONIN - DELTA (2-6H): 2 ng/L (ref <=7)
HIGH-SENSITIVITY TROPONIN I - 6 HOUR: 24 ng/L (ref <=53)

## 2020-05-16 LAB — BLOOD GAS, VENOUS
Acid-base deficit: 0.7 mmol/L (ref 0.0–2.0)
Bicarbonate: 24.9 mmol/L (ref 20.0–28.0)
O2 Saturation: 34.5 %
Patient temperature: 37
pCO2, Ven: 44 mmHg (ref 44.0–60.0)
pH, Ven: 7.36 (ref 7.250–7.430)

## 2020-05-16 MED ADMIN — iohexoL (OMNIPAQUE) 350 mg iodine/mL solution 100 mL: 100 mL | INTRAVENOUS | @ 22:00:00 | Stop: 2020-05-16

## 2020-05-16 MED ADMIN — diazePAM (VALIUM) tablet 2 mg: 2 mg | ORAL | @ 21:00:00 | Stop: 2020-05-16

## 2020-05-16 NOTE — Unmapped (Signed)
ED Progress Note    Patient has been discussed the risks and benefits of CT Contrast with ESRD and not being fully anuric. He states do whatever you have to do doc. He understands the risks and benefits and has the competency and capacity to make this medical decision.

## 2020-05-16 NOTE — Unmapped (Signed)
Pt's wife states pt @ [facility] d/t being unable to walk.  pcp notified.

## 2020-05-16 NOTE — Unmapped (Shared)
ED Attending Note:   I saw the patient on 05/23/2020.  Please also refer to the resident physician note on the same date of service,  which I have attested, for additional details.    History of Present Illness:  Jeremy Oliver is a 50 y.o. male with a PMH of T2DM, ESRD (on HD [name]), chronic systolic heart failure, A-fib (on Eliquis), NSTEMI (2018), CAD (numerous PCI's-most recently in December 2021), s/p permanent pacemaker implantation, and pseudoseizures who is presenting to the ED for evaluation of slurred speech and L arm tingling in the setting of a hospitalization for facial droop and back pain 1 week ago. When he developed a facial droop 1 week ago his doctor recommended he present to [place] to have an CT T-spine scan which showed a a T1 mass. No MRI performed at [place]. Currently, the patient endorses BLE weakness, L arm tingling, and headache in addition to his persistent back pain, facial droop, and slurred speech. He reports numbness in his bilateral soles of his feet and his R thigh 2/2 neuropathy at baseline. He states he has foot drop in his L foot since a heart attack and cardiac arrest in 2018 that resulted in nerve damage. He denies neck pain, vision changes, or dysuria.    He had a similar episode of unresponsiveness on 2/14. CT head and CTA head were performed on 2/14 and were found to be negative. After Neuro consult he was discharged on 2/15 with close neuro follow up for evaluation of possible Bell's Palsy.     Pending head CTA aortic dissection.     @PMHPOS @    Past Surgical History:   Procedure Laterality Date   ??? CARDIAC CATHETERIZATION  03/08/2007   ??? CORONARY STENT PLACEMENT     ??? CYSTOURETHROSCOPY  12/31/2009     Microscopic hematuria   ??? KNEE SURGERY Right 09/23/2006    arthroscopic knee surgery medial and lateral meniscus tears as well as crystal disease   ??? PR CATH PLACE/CORON ANGIO, IMG SUPER/INTERP,R&L HRT CATH, L HRT VENTRIC N/A 02/13/2017    Procedure: Left/Right Heart Catheterization;  Surgeon: Marlaine Hind, MD;  Location: [facility] CATH;  Service: Cardiology   ??? PR CATH PLACE/CORON ANGIO, IMG SUPER/INTERP,W LEFT HEART VENTRICULOGRAPHY N/A 03/17/2017    Procedure: Left Heart Catheterization W Intervention;  Surgeon: Marlaine Hind, MD;  Location: [facility] CATH;  Service: Cardiology   ??? PR CATH PLACE/CORON ANGIO, IMG SUPER/INTERP,W LEFT HEART VENTRICULOGRAPHY N/A 05/12/2018    Procedure: CATH LEFT HEART CATHETERIZATION W INTERVENTION;  Surgeon: Dorathy Kinsman, MD;  Location: [facility] CATH;  Service: Cardiology   ??? PR CATH PLACE/CORON ANGIO, IMG SUPER/INTERP,W LEFT HEART VENTRICULOGRAPHY N/A 10/22/2018    Procedure: CATH LEFT HEART CATHETERIZATION W INTERVENTION;  Surgeon: Marlaine Hind, MD;  Location: [facility] CATH;  Service: Cardiology   ??? PR CATH PLACE/CORON ANGIO, IMG SUPER/INTERP,W LEFT HEART VENTRICULOGRAPHY N/A 08/19/2019    Procedure: Left Heart Catheterization W Intervention;  Surgeon: Marlaine Hind, MD;  Location: [facility] CATH;  Service: Cardiology   ??? PR CATH PLACE/CORON ANGIO, IMG SUPER/INTERP,W LEFT HEART VENTRICULOGRAPHY N/A 03/27/2020    Procedure: CATH LEFT HEART CATHETERIZATION W INTERVENTION;  Surgeon: Marlaine Hind, MD;  Location: [facility] CATH;  Service: Cardiology   ??? PR COMPRE EP EVAL ABLTJ 3D MAPG TX SVT N/A 07/19/2015    Catheter ablation of cavotricuspid isthmus for atrial flutter [facility])   ??? PR ECMO/ECLS INITIATION VENO-ARTERIAL N/A 03/19/2017    Procedure: ECMO/ECLS; INITIATION, VENO-ARTERIAL;  Surgeon: Lennie Odor, MD;  Location: MAIN OR [facility];  Service: Cardiac Surgery   ??? PR ECMO/ECLS RMVL PRPH CANNULA OPEN 6 YRS & OLDER Left 03/25/2017    Procedure: ECMO / ECLS PROVIDED BY PHYSICIAN; REMOVAL OF PERIPHERAL (ARTERIAL AND/OR VENOUS) CANNULA(E), OPEN, 6 YEARS AND OLDER;  Surgeon: Alonna Buckler Ikonomidis, MD;  Location: MAIN OR [facility];  Service: Cardiac Surgery   ??? PR INSER HART PACER XVENOUS ATRIAL N/A 05/30/2015    [employer] dual-chamber pacemaker implant (E.Chung)   ??? PR INSERT/PLACE FLOW DIRECT CATH N/A 03/17/2017    Procedure: Insert Leave In [place];  Surgeon: Marlaine Hind, MD;  Location: [facility] CATH;  Service: Cardiology   ??? PR INSJ/RPLCMT PERM DFB W/TRNSVNS LDS 1/DUAL CHMBR N/A 05/04/2017    Procedure: ICD Implant System (Single/Dual);  Surgeon: Eldred Manges, MD;  Location: [facility] CATH;  Service: Cardiology   ??? PR NEGATIVE PRESSURE WOUND THERAPY DME >50 SQ CM Left 03/25/2017    Procedure: Neg Press Wound Tx (Vac Assist) Incl Topicals, Per Session, Tsa Greater Than/= 50 Cm Squared;  Surgeon: Alonna Buckler Ikonomidis, MD;  Location: MAIN OR  [facility];  Service: Cardiac Surgery   ??? PR PRQ TRLUML CORONARY STENT W/ANGIO ONE ART/BRNCH N/A 03/12/2018    Procedure: Percutaneous Coronary Intervention;  Surgeon: Marlaine Hind, MD;  Location: [facility] CATH;  Service: Cardiology   ??? PR PRQ TRLUML CORONARY STENT W/ANGIO ONE ART/BRNCH N/A 01/27/2020    Procedure: Percutaneous Coronary Intervention;  Surgeon: Marlaine Hind, MD;  Location: [facility] CATH;  Service: Cardiology   ??? PR REBL VES DIRECT,LOW EXTREM Left 03/19/2017    Procedure: Repr Bld Vessel Direct; Lower Extrem;  Surgeon: Boykin Reaper, MD;  Location: MAIN OR [facility];  Service: Vascular   ??? PR REMV ART CLOT ILIAC-POP,LEG INCIS Left 03/25/2017    Procedure: EMBOLECTOMY OR THROMBECTOMY, WITH OR WITHOUT CATHETER; FEMOROPOPLITEAL, AORTOILIAC ARTERY, BY LEG INCISION;  Surgeon: Alonna Buckler Ikonomidis, MD;  Location: MAIN OR [facility];  Service: Cardiac Surgery   ??? PR UPPER GI ENDOSCOPY,BIOPSY N/A 02/28/2016    Procedure: UGI ENDOSCOPY; WITH BIOPSY, SINGLE OR MULTIPLE;  Surgeon: Liane Comber, MD;  [facility];  Service: Gastroenterology   ??? PR UPPER GI ENDOSCOPY,DIAGNOSIS N/A 05/07/2016    Procedure: UGI ENDO, INCLUDE ESOPHAGUS, STOMACH, & DUODENUM &/OR JEJUNUM; DX W/WO COLLECTION SPECIMN, BY BRUSH OR WASH;  Surgeon: Modena Nunnery, MD;  [facility]: [facility];  Service: Gastroenterology       Family History   Problem Relation Age of Onset   ??? Diabetes Mother    ??? Hyperlipidemia Mother    ??? Heart disease Mother    ??? Basal cell carcinoma Mother    ??? Blindness Mother         diabeties   ??? Obesity Mother    ??? Hyperlipidemia Father    ??? Heart disease Father    ??? Lung disease Father    ??? Heart disease Half-Sister    ??? Kidney disease Half-Sister    ??? Gallbladder disease Half-Brother    ??? Obesity Half-Brother    ??? No Known Problems Daughter    ??? No Known Problems Son    ??? Obesity Daughter    ??? Depression Maternal Uncle         Died by suicide in 2017-10-09 by gunshot   ??? Melanoma Neg Hx    ??? Squamous cell carcinoma Neg Hx    ??? Macular degeneration Neg Hx    ???  Glaucoma Neg Hx        Social History     Socioeconomic History   ??? Marital status: Married     Spouse name: [name]   ??? Number of children: 3   ??? Years of education: 19   ??? Highest education level: None   Occupational History   ??? Occupation: DISABLED     Comment: Former IT sales professional   Tobacco Use   ??? Smoking status: Never Smoker   ??? Smokeless tobacco: Current User     Types: Chew   ??? Tobacco comment: 04/10/19:  Dips 3x a day./ /Pt in process of reducing tobacco use,  dips 1 can every 3 weeks,   Vaping Use   ??? Vaping Use: Never used   Substance and Sexual Activity   ??? Alcohol use: Yes     Alcohol/week: 0.0 standard drinks     Comment: rare use: couple times a year, small amount   ??? Drug use: Never   ??? Sexual activity: Not Currently     Partners: Female   Other Topics Concern   ??? Do you use sunscreen? No   ??? Tanning bed use? No   ??? Are you easily burned? No   ??? Excessive sun exposure? No   ??? Blistering sunburns? No   Social History Narrative    Information updated:  01/08/16. Reviewed last: 12/01/18, 08/15/19        Born in [place]    Raised with both parents    Parents died w/in 6 mos of each other. His parents knew they were going to pass and told him just before.    Half sister and 2 half brothers        Married 31 yrs. Wife [name]    2 other children son and daughter, adopted out    Daughter Bridgette, bf DJ        Education - completed through 11th. 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    Former Presenter, broadcasting since age 83    Lives with wife, difficulty paying bills 12/30/17     Lives next door to daughter, husband and their grandson        05/19/18: pt moving to new home in next 5 days- after an eviction. Has Medicaid assistance again. Will be w/ wife [name], they babysit their grandson often.    06/14/18: Pt and husband living with daughter while new location to live is determined.    12/01/18: pt living with wife and friends. Friends have two young children. [name] states soon they will have their own place, however doesn't know when. Pt states it will be after the neighbors are evicted, we can move in. Pt will need to start dialysis soon. Pt and wife Not talking with daughter and grandson lately.      Social Determinants of Health     Financial Resource Strain: Low Risk    ??? Difficulty of Paying Living Expenses: Not very hard   Food Insecurity: No Food Insecurity   ??? Worried About Running Out of Food in the Last Year: Never true   ??? Ran Out of Food in the Last Year: Never true   Transportation Needs: No Transportation Needs   ??? Lack of Transportation (Medical): No   ??? Lack of Transportation (Non-Medical):  No   Physical Activity: Not on file   Stress: Stress Concern Present   ??? Feeling of Stress : Rather much   Social Connections: Moderately Isolated   ??? Frequency of Communication with Friends and Family: More than three times a week   ??? Frequency of Social Gatherings with Friends and Family: Twice a week   ??? Attends Religious Services: Never   ??? Active Member of Clubs or Organizations: No   ??? Attends Banker Meetings: Never   ??? Marital Status: Married       Review of Systems:  Constitutional: Negative for fever.  Cardiovascular: Negative for chest pain.  Genitourinary: Negative for dysuria.  Musculoskeletal: Positive for back pain. Negative for neck pain.  Neurological: Positive for slurred speech, facial droop, headache, BLE weakness, and tingling.    Physical Exam:  BP 168/79  - Resp 20  - SpO2 96%   VS are notable for hypertension to 168/79, but are otherwise unremarkable. During physical exam the patient had an episode of unresponsiveness, lasting 30 seconds, without loss of pulse, awoke mentating normally, no post ictal state.     ECG:  I reviewed and interpreted the patient's ECG performed at *** and it showed:  ***    Lab Results:  I reviewed and interpreted the patient's lab results:  Labs Reviewed   COMPREHENSIVE METABOLIC PANEL - Abnormal; Notable for the following components:       Result Value    Potassium 4.9 (*)     BUN 36 (*)     Creatinine 6.55 (*)     EGFR CKD-EPI Non-African American, Male 9 (*)     EGFR CKD-EPI African American, Male 10 (*)     Albumin 3.2 (*)     Total Bilirubin 0.2 (*)     ALT 7 (*)     Alkaline Phosphatase 120 (*)     All other components within normal limits   CBC W/ AUTO DIFF - Abnormal; Notable for the following components:    RBC 4.31 (*)     HGB 12.3 (*)     HCT 37.3 (*)     RDW 17.2 (*)     Anisocytosis Slight (*)     All other components within normal limits   HIGH SENSITIVITY TROPONIN I - SERIAL - Normal   CBC W/ DIFFERENTIAL    Narrative:     The following orders were created for panel order CBC w/ Differential.  Procedure                               Abnormality         Status                     ---------                               -----------         ------                     CBC w/ Differential[[id number]]         Abnormal            Final result                 Please view results for these tests on the individual orders.   URINALYSIS  WITH CULTURE REFLEX   HIGH SENSITIVITY TROPONIN I - 2 HOUR SERIAL   HIGH SENSITIVITY TROPONIN I - 6 HOUR SERIAL Imaging Results:  The patient's imaging results were reviewed by me and interpreted by radiology:  CTA Aortic Dissection    (Results Pending)       Medications - No data to display         Medical Decision Making:  ***    ED Diagnoses:  ***    Sherral Hammers rajan     Documentation assistance was provided by Smitty Cords, Scribe, on May 16, 2020 at 3:40 PM for Army Melia, MD.     {*** NOTE TO PROVIDER: PLEASE ADD EDPROVSCRIBEATTEST NOTING YOU AGREE WITH SCRIBE DOCUMENTATION}
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
## 2020-05-16 NOTE — Unmapped (Signed)
Pt was recently DC from [facility] with left sided weakness. Pt has a mass on T1. Pt with continued left sided weakness. Pt was seen by neurology at [facility]. Pt with no new symptoms

## 2020-05-17 LAB — COMPREHENSIVE METABOLIC PANEL
ALBUMIN: 2.9 g/dL — ABNORMAL LOW (ref 3.4–5.0)
ALKALINE PHOSPHATASE: 112 U/L (ref 46–116)
ALT (SGPT): 7 U/L — ABNORMAL LOW (ref 10–49)
ANION GAP: 7 mmol/L (ref 5–14)
AST (SGOT): 14 U/L (ref <=34)
BILIRUBIN TOTAL: 0.2 mg/dL — ABNORMAL LOW (ref 0.3–1.2)
BLOOD UREA NITROGEN: 35 mg/dL — ABNORMAL HIGH (ref 9–23)
BUN / CREAT RATIO: 5
CALCIUM: 8.4 mg/dL — ABNORMAL LOW (ref 8.7–10.4)
CHLORIDE: 104 mmol/L (ref 98–107)
CO2: 23 mmol/L (ref 20.0–31.0)
CREATININE: 7.34 mg/dL — ABNORMAL HIGH
EGFR CKD-EPI AA MALE: 9 mL/min/{1.73_m2} — ABNORMAL LOW (ref >=60)
EGFR CKD-EPI NON-AA MALE: 8 mL/min/{1.73_m2} — ABNORMAL LOW (ref >=60)
GLUCOSE RANDOM: 128 mg/dL (ref 70–179)
POTASSIUM: 5.1 mmol/L — ABNORMAL HIGH (ref 3.4–4.5)
PROTEIN TOTAL: 6.6 g/dL (ref 5.7–8.2)
SODIUM: 134 mmol/L — ABNORMAL LOW (ref 135–145)

## 2020-05-17 LAB — HIGH SENSITIVITY TROPONIN I - SERIAL: HIGH SENSITIVITY TROPONIN I: 24 ng/L (ref <=53)

## 2020-05-17 LAB — PROTIME-INR
INR: 1.51
PROTIME: 17.6 s — ABNORMAL HIGH (ref 10.3–13.4)

## 2020-05-17 LAB — HIGH SENSITIVITY TROPONIN I - 6 HOUR SERIAL
HIGH SENSITIVITY TROPONIN - DELTA (2-6H): 0 ng/L (ref <=7)
HIGH-SENSITIVITY TROPONIN I - 6 HOUR: 23 ng/L (ref <=53)

## 2020-05-17 LAB — HIGH SENSITIVITY TROPONIN I - 2 HOUR SERIAL
HIGH SENSITIVITY TROPONIN - DELTA (0-2H): 1 ng/L (ref <=7)
HIGH-SENSITIVITY TROPONIN I - 2 HOUR: 23 ng/L (ref <=53)

## 2020-05-17 LAB — D-DIMER, QUANTITATIVE: D-DIMER QUANTITATIVE (CW,ML,HL,HS,CH): 532 ng{FEU}/mL — ABNORMAL HIGH (ref <=500)

## 2020-05-17 MED ADMIN — oxyCODONE (ROXICODONE) immediate release tablet 5 mg: 5 mg | ORAL | @ 09:00:00 | Stop: 2020-05-30

## 2020-05-17 MED ADMIN — pantoprazole (PROTONIX) EC tablet 40 mg: 40 mg | ORAL | @ 16:00:00

## 2020-05-17 MED ADMIN — valACYclovir (VALTREX) tablet 500 mg: 500 mg | ORAL | @ 23:00:00 | Stop: 2020-05-24

## 2020-05-17 MED ADMIN — ranolazine (RANEXA) 12 hr tablet 500 mg: 500 mg | ORAL | @ 04:00:00

## 2020-05-17 MED ADMIN — amiodarone (PACERONE) tablet 200 mg: 200 mg | ORAL | @ 23:00:00

## 2020-05-17 MED ADMIN — mirtazapine (REMERON) tablet 7.5 mg: 7.5 mg | ORAL | @ 04:00:00

## 2020-05-17 MED ADMIN — ezetimibe (ZETIA) tablet 10 mg: 10 mg | ORAL | @ 04:00:00

## 2020-05-17 MED ADMIN — melatonin tablet 3 mg: 3 mg | ORAL | @ 04:00:00

## 2020-05-17 MED ADMIN — nitroglycerin (NITROSTAT) SL tablet 0.4 mg: .4 mg | SUBLINGUAL | @ 15:00:00

## 2020-05-17 MED ADMIN — atorvastatin (LIPITOR) tablet 80 mg: 80 mg | ORAL | @ 16:00:00

## 2020-05-17 MED ADMIN — sevelamer (RENVELA) tablet 2,400 mg: 2400 mg | ORAL | @ 23:00:00

## 2020-05-17 MED ADMIN — bumetanide (BUMEX) tablet 2 mg: 2 mg | ORAL | @ 16:00:00

## 2020-05-17 MED ADMIN — metoprolol succinate (TOPROL-XL) 24 hr tablet 100 mg: 100 mg | ORAL | @ 16:00:00

## 2020-05-17 MED ADMIN — ticagrelor (BRILINTA) tablet 90 mg: 90 mg | ORAL | @ 16:00:00

## 2020-05-17 MED ADMIN — oxyCODONE (ROXICODONE) immediate release tablet 5 mg: 5 mg | ORAL | @ 23:00:00 | Stop: 2020-05-30

## 2020-05-17 MED ADMIN — oxyCODONE (ROXICODONE) immediate release tablet 5 mg: 5 mg | ORAL | @ 06:00:00 | Stop: 2020-05-17

## 2020-05-17 MED ADMIN — gabapentin (NEURONTIN) capsule 100 mg: 100 mg | ORAL | @ 16:00:00

## 2020-05-17 MED ADMIN — FLUoxetine (PROzac) capsule 80 mg: 80 mg | ORAL | @ 16:00:00

## 2020-05-17 MED ADMIN — ranolazine (RANEXA) 12 hr tablet 500 mg: 500 mg | ORAL | @ 16:00:00

## 2020-05-17 MED ADMIN — insulin glargine (LANTUS) injection 20 Units: 20 [IU] | SUBCUTANEOUS | @ 04:00:00

## 2020-05-17 MED ADMIN — insulin lispro (HumaLOG) injection 5 Units: 5 [IU] | SUBCUTANEOUS | @ 23:00:00

## 2020-05-17 MED ADMIN — amiodarone (PACERONE) tablet 200 mg: 200 mg | ORAL | @ 16:00:00

## 2020-05-17 MED ADMIN — nicotine (NICODERM CQ) 14 mg/24 hr patch 1 patch: 1 | TRANSDERMAL | @ 23:00:00

## 2020-05-17 MED ADMIN — sevelamer (RENVELA) tablet 2,400 mg: 2400 mg | ORAL | @ 04:00:00

## 2020-05-17 MED ADMIN — apixaban (ELIQUIS) tablet 5 mg: 5 mg | ORAL | @ 16:00:00

## 2020-05-17 MED ADMIN — acetaminophen (TYLENOL) tablet 650 mg: 650 mg | ORAL | @ 04:00:00

## 2020-05-17 MED ADMIN — cholecalciferol (vitamin D3 25 mcg (1,000 units)) tablet 25 mcg: 25 ug | ORAL | @ 16:00:00

## 2020-05-17 MED ADMIN — ticagrelor (BRILINTA) tablet 90 mg: 90 mg | ORAL | @ 04:00:00

## 2020-05-17 MED ADMIN — acetaminophen (TYLENOL) tablet 650 mg: 650 mg | ORAL | @ 16:00:00

## 2020-05-17 MED ADMIN — oxyCODONE (ROXICODONE) immediate release tablet 5 mg: 5 mg | ORAL | @ 15:00:00 | Stop: 2020-05-30

## 2020-05-17 MED ADMIN — predniSONE (DELTASONE) tablet 60 mg: 60 mg | ORAL | @ 23:00:00 | Stop: 2020-05-24

## 2020-05-17 MED ADMIN — oxyCODONE (ROXICODONE) immediate release tablet 5 mg: 5 mg | ORAL | @ 02:00:00 | Stop: 2020-05-30

## 2020-05-17 MED ADMIN — methocarbamoL (ROBAXIN) tablet 500 mg: 500 mg | ORAL | @ 04:00:00

## 2020-05-17 MED ADMIN — apixaban (ELIQUIS) tablet 5 mg: 5 mg | ORAL | @ 04:00:00

## 2020-05-17 MED ADMIN — allopurinoL (ZYLOPRIM) tablet 200 mg: 200 mg | ORAL | @ 16:00:00

## 2020-05-17 MED ADMIN — insulin lispro (HumaLOG) injection 1-20 Units: 1-20 [IU] | SUBCUTANEOUS | @ 23:00:00

## 2020-05-17 MED ADMIN — isosorbide mononitrate (IMDUR) 24 hr tablet 60 mg: 60 mg | ORAL | @ 23:00:00

## 2020-05-17 NOTE — Unmapped (Signed)
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 Positive (A) <100 ng/mL  Buprenorphine, Urine Negative <5 ng/mL   Lipase Level    Collection Time: 2020/05/26  6:47 PM   Result Value Ref Range    Lipase 26 12 - 53 U/L   POCT Glucose    Collection Time: 26-May-2020 10:02 PM   Result Value Ref Range    Glucose, POC 113 70 - 179 mg/dL   Lactate, Venous, Whole Blood    Collection Time: 05-26-2020 10:32 PM   Result Value Ref Range    Lactate, Venous 1.3 0.5 - 1.8 mmol/L   PT-INR    Collection Time: 05/17/20  4:38 AM   Result Value Ref Range    PT 17.6 (H) 10.3 - 13.4 sec    INR 1.51    POCT Glucose    Collection Time: 05/17/20  8:36 AM   Result Value Ref Range    Glucose, POC 137 70 - 179 mg/dL   POCT Glucose    Collection Time: 05/17/20 10:05 AM   Result Value Ref Range    Glucose, POC 119 70 - 179 mg/dL   ECG 12 Lead    Collection Time: 05/17/20 10:07 AM   Result Value Ref Range    EKG Systolic BP  mmHg    EKG Diastolic BP  mmHg    EKG Ventricular Rate 70 BPM    EKG Atrial Rate 70 BPM    EKG P-R Interval 306 ms    EKG QRS Duration 116 ms    EKG Q-T Interval 482 ms    EKG QTC Calculation 520 ms    EKG Calculated P Axis 109 degrees    EKG Calculated R Axis -10 degrees    EKG Calculated T Axis 147 degrees    QTC Fredericia 507 ms   hsTroponin I (serial 0-2-6H w/ delta)    Collection Time: 05/17/20 10:24 AM   Result Value Ref Range    hsTroponin I 24 <=53 ng/L   Comprehensive Metabolic Panel    Collection Time: 05/17/20 10:24 AM   Result Value Ref Range    Sodium 134 (L) 135 - 145 mmol/L    Potassium 5.1 (H) 3.4 - 4.5 mmol/L    Chloride 104 98 - 107 mmol/L    Anion Gap 7 5 - 14 mmol/L    CO2 23.0 20.0 - 31.0 mmol/L    BUN 35 (H) 9 - 23 mg/dL Creatinine 1.19 (H) 1.47 - 1.10 mg/dL    BUN/Creatinine Ratio 5     EGFR CKD-EPI Non-African American, Male 8 (L) >=60 mL/min/1.52m2    EGFR CKD-EPI African American, Male 9 (L) >=60 mL/min/1.36m2    Glucose 128 70 - 179 mg/dL    Calcium 8.4 (L) 8.7 - 10.4 mg/dL    Albumin 2.9 (L) 3.4 - 5.0 g/dL    Total Protein 6.6 5.7 - 8.2 g/dL    Total Bilirubin 0.2 (L) 0.3 - 1.2 mg/dL    AST 14 <=82 U/L    ALT 7 (L) 10 - 49 U/L    Alkaline Phosphatase 112 46 - 116 U/L   D-Dimer, Quantitative    Collection Time: 05/17/20 10:24 AM   Result Value Ref Range    D-Dimer 532 (H) <=500 ng/mL FEU   POCT Glucose    Collection Time: 05/17/20 11:28 AM   Result Value Ref Range    Glucose, POC 116 70 - 179 mg/dL   hsTroponin I - 2 Hour    Collection Time: 05/17/20 12:06 PM   Result Value Ref Range  hsTroponin I 23 <=53 ng/L    delta hsTroponin I 1 <=7 ng/L
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
## 2020-05-17 NOTE — Unmapped (Signed)
Patient is alert and oriented and able to make his needs known. VSS and in RA to maintain his SPO2> 95 %. This morning at around 10 he passed out fpr more that 30 sec to called for rapid. He woke up holding his chest as CO pain. EKG and labs were sent. 2 doses of Nitrostat  given along with Oxy for back pain. Then after he felt comfortable and he ate some bites of his food and went to sleep. He is urinal to void. Remained free from fall and will continue to monitor him closely.  Problem: Adult Inpatient Plan of Care  Goal: Plan of Care Review  Outcome: Ongoing - Unchanged  Goal: Patient-Specific Goal (Individualized)  Outcome: Ongoing - Unchanged  Goal: Absence of Hospital-Acquired Illness or Injury  Outcome: Ongoing - Unchanged  Intervention: Identify and Manage Fall Risk  Recent Flowsheet Documentation  Taken 05/17/2020 0800 by Milinda Pointer, RN  Safety Interventions:  ??? bed alarm  ??? fall reduction program maintained  ??? low bed  Intervention: Prevent and Manage VTE (Venous Thromboembolism) Risk  Recent Flowsheet Documentation  Taken 05/17/2020 0800 by Milinda Pointer, RN  Activity Management: activity adjusted per tolerance  Goal: Optimal Comfort and Wellbeing  Outcome: Ongoing - Unchanged  Goal: Readiness for Transition of Care  Outcome: Ongoing - Unchanged  Goal: Rounds/Family Conference  Outcome: Ongoing - Unchanged     Problem: Self-Care Deficit  Goal: Improved Ability to Complete Activities of Daily Living  Outcome: Ongoing - Unchanged     Problem: Fall Injury Risk  Goal: Absence of Fall and Fall-Related Injury  Outcome: Ongoing - Unchanged  Intervention: Promote Injury-Free Environment  Recent Flowsheet Documentation  Taken 05/17/2020 0800 by Milinda Pointer, RN  Safety Interventions:  ??? bed alarm  ??? fall reduction program maintained  ??? low bed     Problem: Diabetes Comorbidity  Goal: Blood Glucose Level Within Targeted Range  Outcome: Ongoing - Unchanged     Problem: Pain Chronic (Persistent) (Comorbidity Management)  Goal: Acceptable Pain Control and Functional Ability  Outcome: Ongoing - Unchanged

## 2020-05-17 NOTE — Unmapped (Addendum)
Jeremy Oliver is a 50 y.o. 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      Left sided weakness:   Initially concerned for possible stroke, though symptoms had been ongoing >1 week and he had a normal CTA head at OSH showed no evidence of emergent large vessel occlusion or significant stenosis.  Left arm and leg weakness is of unclear etiology - per Neurology, exam is inconsistent and his LLE weakness and foot drop are chronic. Ultimately, PT/OT were recommended to help work on strength, and they recommended 5x low intensity. Patient was initially hesitant to go to rehab due to a prior bad experience, but ultimately agreeable.     Report of possible T1 abnormality on OSH imaging  During recent admission to [facility], initial concern for spinal pathology based on CT, though repeat CT reportedly normal by their read. He was unable to get an MRI b/c of his pacemaker leads that were incompatible with MRI there. We were able to get T-spine images transferred over from [facility] - reviewed with attending radiologist - on further review of imaging (including prior CTA chest), no evidence of true pathology and T1 abnormality seen on 2/11 most consistent with an artifact.     Of note, prior to above discussion, we had discussed with Cardiology if an MRI could be done - this would be tricky, but possible if needed (would have to be programmed under direction of EP attending). Given discussion with Radiology, patient does not need an MRI at this time, so this was not pursued. If patient needs MRI in the future, order consult to EP device nurse. From our evaluation this admission, patient is only approved for off-label MRI by the signing EP Attending. Patient has a non-conditional device system do to the capped and abandoned RV pacing lead. Patient is not pacing dependent.    Facial palsy consistent with Bell's palsy:   Likely associated with recent COVID-19 infection, although could be due to a number of etiologies. Treated with prednisone 60mg  daily x7 days (2/17-23) and valacyclovir 500mg  daily x7 days (on HD days should get after dialysis).      Back pain:   Suspect that this is musculoskeletal in origin - he says it started when he was picking up something heavy with a twisting motion and that it runs across both of his shoulders. Very tender to light palpation. Treated with scheduled tylenol, lidocaine patch, voltaren gel, and prn oxycodone. Encouraged working with PT/OT and up and out of bed.      Left ankle pain:  After fall. X-rays on admission notable for diffuse soft tissue swelling but no acute osseous abnormalities. Treated conservatively      CAD s/p PCI (last 02/2020):   Significant disease, most recent cath 02/2020 with severe 2v disease. Often has angina with dialysis. Per notes, guarded prognosis, have discussed palliative care outpatient given ongoing symptoms. Continued on home metoprolol, ticagrelor, atorvastatin, zetia, isosorbide (non-HD days) and ranexa    Adrenal lesions   On CT abdomen and pelvis, noted to have bilateral macroscopic fat-containing heterogeneous adrenal lesions, with the right adrenal mass having significantly increased over time, now 3.3 cm versus 2 cm in 2015. Yearly the left adrenal gland now measures 2 cm, previously 1.5 cm. Given presence  of macroscopic fat, adrenal myelolipoma remains present, but given interval growth presence of soft tissue components, shorter interval follow-up is suggested to ensure stability. Should have repeat follow up imaging in one year to monitor.     Bruising    Pt reporting new bruising over shoulder, bilateral abdomen/sides. Denied falling. Wife says did not have when he left home. CBC, PT/INR, PTT, fibrinogen normal. D-dimer slightly elevated. Per other provider notes, present on admission - suspect evolving bruising that likely developed after minor trauma given his Eliquis and antiplatelet agent. Closely monitored any new bruising.     Paroxysmal Afib:   Continued home amiodarone, Eliquis and toprol XL.     HFrEF (20-25%, AICD in place):   Continued Toprol XL, statin, bumex (he reports taking every day but med rec says on non-HD days).     DM2, insulin dependent:   Home regimen 35 units nightly detemir and 15 units aspart with meals continued. Elevated sugars in the setting of prednisone for Bells Palsy. Per wife, needs to get his insulin in his arm, not abdomen. Discussed with nursing      ESRD on HD [name]:   Dialyzes through R tunneled catheter, typically [name] though last received HD Tuesday Feb 15 during OSH admission. Will note he does still make urine and did receive contrasted CT after discussion of risk/benefits in ED. Home sevelamer continued.      Prior seizure like episodes without EEG correlate:   During prior hospitalizations has had unresponsive episodes with normal vital signs with full workup including cardiac workup, central head imaging, neurology consult and EEG. 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     Anxiety:  Continued home fluoxetine. Patient should follow up with Palliative Care as an outpatient to help with coping with medical diagnoses (has already been referred outpatient).      Tobacco Abuse:   Uses chewing tobacco, around 0.5 cans weekly per family. Tobacco cessation counseling evaluated patient, appreciate their assistance. He was started on nicotine patch 14mg  and gum 4mg  every hour PRN.     Chest pain:   Initially reported to ED, though improved shortly after admission. Given this and associated back pain and neuro findings, there was concern for dissection, but CTA was negative and troponins have been negative with no acute changes on ECG.

## 2020-05-17 NOTE — Unmapped (Signed)
Inpatient Tobacco Cessation Counseling Note    This medical encounter was conducted virtually using [email or web address]  TeleHealth protocols.    I have identified myself to the patient and conveyed my credentials to Mr. Erck  I have explained the capabilities and limitations of telemedicine and the patient/proxy and myself both agree that it is appropriate for their current circumstances/symptoms.     Contact Information  Person Contacted: Jeremy Oliver         Contact Phone number: [phone]      Phone Outcome: Spoke with pt's wife  Is there someone else in the room? Yes. What is your relationship? Wife. Do you want this person here for the visit? Yes.    Patient's location at the time of the telephone visit: Hospitalized at [facility]   Provider's location at the time of the telephone visit: At home, in [place]      Purpose of contact:     Pt participated in a telephone visit for tobacco cessation counseling.  Patient was admitted to hospital for Dysarthria [R47.1]  Paresthesia of left upper extremity [R20.2]  Chest pain, unspecified type [R07.9]  Thoracic back pain, unspecified back pain laterality, unspecified chronicity [M54.6]. Patient consented to telephone visit given due to social isolation measures in place due to the COVID-19 pandemic.     Tobacco Use History and Assessment  Time Since Last Tobacco Use: 1 to 7 days ago  Tobacco Withdrawal (Past 24 Hours): Other (comment) (desire or cravings for nicotine)  Type of Tobacco Products Used: Smokeless  Quantity Used: 0.5 (can)  Quantity Per: week  Time to First Use After Waking: 5-30 minutes  Brand of Tobacco Used: [place]  Longest Time Without Tobacco: Hours  Medications Used in Past Attempts: Nicotine Patch  Previously seen by NDP?: Hospital Inpatient    Behavioral Assessment  Why Uses: 1. Long Standing habit 2. Stress relief  Barriers/Challenges: 1. Long Standing Habit 2. Doesn't want to stop          NOTE: Pt's wife informed SW that husband was too groggy to talk. She stated pt has been dipping tobacco for years. He was using 1 can of chewing tobacco every two weeks. She reported that he had used the Nicotine patch during last hospitalization, but discontinued when he was discharged. She said that he currently reported having cravings, and could benefit from using the patch and gum while in the hospital. She wasn't sure he would want to use NRT at discharge.  SW educated wife about park and chew method with gum and to ask for the gum as needed on an hourly basis. SW provided pt with contact information, physical improvements related to tobacco cessation, and available resources (including outpatient Tobacco Treatment Program at [facility] and [facility] [facility])    Treatment Plan  Please see below for medication recommendations in bold.   Cessation Meds Currently Using: None  Medications Recommended During Hospitalization: Patch 14mg ,Gum 4mg   Outpatient/Discharge Medications Recommended: Patch 14mg ,Gum 4mg  (if pt consents)  Plan to Obtain Outpatient Meds: TTS messaged providers for Rx  Patient's Plan Post Discharge/Visit: Does not plan to quit in next 6 months  Family Members Included in Intervention/Plan: Yes (wife)       As part of this Telephone Visit, no in-person exam was conducted.     I personally spent 11 minutes counseling the patient via telephone about tobacco cessation.  I spent an additional 8 minutes on pre- and post-visit activities.  The patient was physically located in [place] or a state in which I am permitted to provide care. The patient and/or parent/guardian understood that s/he [place] incur co-pays and cost sharing, and agreed to the telemedicine visit. The visit was reasonable and appropriate under the circumstances given the patient's presentation at the time.     The patient and/or parent/guardian has been advised of the potential risks and limitations of this mode of treatment (including, but not limited to, the absence of in-person examination) and has agreed to be treated using telemedicine. The patient's/patient's family's questions regarding telemedicine have been answered.      If the visit was completed in an ambulatory setting, the patient and/or parent/guardian has also been advised to contact their provider???s office for worsening conditions, and seek emergency medical treatment and/or call 911 if the patient deems either necessary.     Visit Format/Coding: Telephone     Coding: 08657 (11-20 minutes)  Service rendered over the phone most consistent with: Tobacco cessation counseling, greater than 10 minutes ([id number])       Erin Hearing, LCSW  Tobacco Treatment Specialist  [phone]

## 2020-05-17 NOTE — Unmapped (Signed)
Patient arrived from [place] around 2200 last night, AOx4, VSS on RA with slightly elevated BP. Complains of significant back pain not relieved by robaxin and tylenol, extra one time dose of 5 mg oxycodone with some relief. Tolerating renal diet, turns self, calls appropriately. Call bell within reach, WCTM.     Problem: Adult Inpatient Plan of Care  Goal: Plan of Care Review  Outcome: Progressing  Goal: Patient-Specific Goal (Individualized)  Outcome: Progressing  Goal: Absence of Hospital-Acquired Illness or Injury  Outcome: Progressing  Intervention: Identify and Manage Fall Risk  Recent Flowsheet Documentation  Taken 05/19/2020 2246 by Evalina Field, RN  Safety Interventions:   commode/urinal/bedpan at bedside   environmental modification   fall reduction program maintained   lighting adjusted for tasks/safety   low bed   nonskid shoes/slippers when out of bed   room near unit station  Intervention: Prevent Skin Injury  Recent Flowsheet Documentation  Taken 05/17/2020 2246 by Evalina Field, RN  Skin Protection:   adhesive use limited   incontinence pads utilized  Intervention: Prevent and Manage VTE (Venous Thromboembolism) Risk  Recent Flowsheet Documentation  Taken 05/19/2020 2246 by Evalina Field, RN  Activity Management: activity adjusted per tolerance  Goal: Optimal Comfort and Wellbeing  Outcome: Progressing  Goal: Readiness for Transition of Care  Outcome: Progressing  Goal: Rounds/Family Conference  Outcome: Progressing

## 2020-05-17 NOTE — Unmapped (Signed)
Patient is only approved for off-label MRI by the signing EP Attending.    Patient has a non-conditional device system do to the capped and abandoned RV pacing lead.  Patient is not pacing dependent.      GENERATOR INFORMATION  Manufacturer & Model #    [employer]  VIGILANT EL ICD D233/ [phone]      LEAD INFORMATION  Abandoned/Epicardial Lead(s)  Yes    Lead Notes viable [phone] capped 4Feb2019     CXR verified 05/06/2020     Manufacturer & Model #             Prescreening Requirements  *The patient has no implanted lead extenders, lead adaptors, or abandoned leads  *The patient has no broken leads or leads with intermittent electrical contact, as confirmed by lead impedance history >200 ohms and <1500 ohms  *Implanted for more than 6 weeks-unless EP Attending approval  *Device is operating within the projected service life-Cannot be done if EOS, ERI, near ERI without Ep Attending approval  *For patients whose device will be programmed to an asynchronous pacing mode when the MRI SureScan mode is programmed to On, no diaphragmatic stimulation is present when the paced leads have a pacing output of 5.0 V and a pulse width of 1.0 ms.  *Pacing capture thresholds ? 2.0 V at 0.4 ms-if dependent (pacing left on)        MR CONDITIONAL STATUS AND MANAGEMENT    System has been implanted more than 6 weeks?  Yes Implant date: 05/04/2017    MR NON-CONDITIONAL SYSTEM  Risks and Potential Complications discussed with patient and patient consents to procedure  The following conditions have been met:  No fractured, epicardial, or abandoned leads  MR is the best test for condition    Patient is not pacemaker Dependent    Device will be programmed under the direction of the Ep Attending by the Ep Device RN

## 2020-05-17 NOTE — Unmapped (Signed)
[facility] Medicine   Progress Note    Assessment/Plan:    Principal Problem:    Weakness  Active Problems:    Chest pain    Tobacco use disorder    Transient alteration of awareness    Dysarthria    Paresthesia of left upper extremity    Thoracic back pain  Resolved Problems:    * No resolved hospital problems. *      Jeremy Oliver is a 50 y.o. male with PMHx as noted below who presents to [facility] with Weakness.    Left sided weakness - Dysarthria: Concern for possible stroke, though symptoms have been ongoing >1 week. 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 With his back pain, also concern for possible spinal pathology that could explain peripheral symptoms. He has been unable to get an MRI because prior free pacemaker leads are incompatible per OSH. Will note that his neuro exam was somewhat inconsistent. Question whether this could be some type of post-COVID complication given he had mild COVID in January.  - Neurology consulted, appreciate their assistance. Facial palsy felt to be consistent with Bell's Palsy as below. Left lower leg weakness and foot drop are chronic. Unclear etiology to left upper extremity weakness but concern that at least a component of this is volitional   - Trend neuro exam  - PT/OT/speech evaluation - previously not interested in rehab but now open to this   - Speech: Rec aspiration precautions, otherwise regular consistent of food and liquids   - PT/OT: Attempted to see patient but was having a rapid response, will follow up  - Cardiology device team consulted to evaluate if pacemaker is MRI compatible, appreciate their assistance  - Holding on further imaging after discussion with neurology consult team    Facial palsy consistent with Bell's palsy: Likely associated with recent COVID-19 infection, although could be due to a number of etiologies.  - Appreciate neurology consult team assistance in evaluation  - Start prednisone 60mg  daily x7 days  - Start valacyclovir 500mg  daily x7 days (on HD days should get after dialysis)    Back pain: Certainly has musculoskeletal components; however initial CT at outside hospital reported possible T1 mass. A repeat thoracic CT at OSH then was read as normal and initial findings thought to be possible artifact. Will note both mention artifact present due to body habitus.  He has no bowel/bladder incontinence to suggest acute cord pathology and his unilateral findings do argue more for a central process  - Would be helpful to have radiology overread images - will continue to attempt to push to PACS  - If unable to obtain imaging, [place] need spine/neurosurgery consult  - Consider seeing if we are able to perform MRI here (at OSH reportedly pacing wires were MRI incompatible, will note he was able to have an MRI in 2016)  - Symptomatic pain control    Chest pain: Initially reported to ED, though improved on my exam. Given this and associated back pain and neuro findings, there was concern for dissection.  - CTA to evaluate for aortic dissection was negative  - Troponins have been negative with no acute changes on ECG    Left ankle pain: After fall. X-rays on admission notable for diffuse soft tissue swelling but no acute osseous abnormalities.  - Treat conservatively     CAD s/p PCI (last 02/2020):  Significant disease, most recent cath 02/2020 with severe 2v disease. 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  Will note he does still make urine and did receive contrasted CT after discussion of risk/benefits in ED  - Nephrology consulted, appreciate their assistance  - Continue home sevalamer    Prior seizure like episodes without EEG correlate: During prior hospitalizations has had unresponsive episodes with normal vital signs with full workup including cardiac workup, central head imaging, neurology consult and EEG. 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  -  Monitor closely    Anxiety:  - Continue home fluoxetine  - Consider palliative care consult for significant issues coping with medical diagnoses (has been referred outpatient)    Tobacco Abuse: Uses chewing tobacco, around 0.5 cans weekly per family.   - Tobacco cessation counseling evaluated patient, appreciate their assistance  - Start nicotine patch 14mg    - Start nicotine gum 4mg  every hour PRN      Code Status: Full Code, wife is decision maker if he is unable, currently has capacity    F/E/N:  - Renal diet    Proph: On apixaban for anticoagulation    Vaccinated with J&J vaccine x1, had covid in January and received monoclonal antibody infusion    Dispo: PT/OT and speech consulted to determine disposition, [place] need SNF  ___________________________________________________________________    Subjective / Events:   Overnight given additional dose of oxycodone for back/chest discomfort. This AM patient had episode of unresponsiveness, rapid response called. Etiology of episode unclear, no new neurologic deficits and improved over time. During episode did complain of chest pain extending through to his upper back. Given 2 doses of nitroglycerin as well as oxycodone with improvement. Physical Exam:  Temp:  [36 ??C (96.8 ??F)-36.6 ??C (97.9 ??F)] 36.6 ??C (97.9 ??F)  Heart Rate:  [62-72] 62  SpO2 Pulse:  [72] 72  Resp:  [18-20] 20  BP: (155-177)/(79-91) 155/91  SpO2:  [95 %-98 %] 97 %  Body mass index is 44.67 kg/m??.    GEN: Somnolent but rousable, oriented, in no distress at rest but with provocation reports pain in back  HEENT: Clear OP, MMM  CV: Regular rate and rhythm, no m/r/g, 1+ peripheral edema  RESP: CTA BL, no w/r/r. Normal work of breathing  ABD: Soft, NT/ND, normoactive bowel sounds  SKIN: Scattered bruising over bilateral chest, posterior left neck, abdomen  MSK/ext: Warm, well perfused, left ankle slightly more edematous than right, tenderness with range of motion, mild bruising  NEURO: alert, oriented x4, PERRL, EOMI, moderate left facial droop including forehead and increased left eye weakness. Somewhat weaker left upper extremity compared to right, though exam is inconsistent  PSYCH: Anxious, intermittently tearful and tremulous  Test Results:  Lab Results   Component Value Date    WBC 7.0 05/19/2020    WBC 7.9 05/09/2020    WBC 7.5 06/09/2014    WBC 8.9 06/08/2014    Absolute Neutrophils 5.5 05/13/2020    Absolute Neutrophils 5.4 06/08/2014    Absolute Lymphocytes 0.8 05/25/2020    Absolute Lymphocytes 2.5 06/08/2014    Absolute Eosinophils 0.2 05/04/2020    Absolute Eosinophils 0.2 06/08/2014    HGB 12.3 (L) 05/14/2020    Hemoglobin 8.1 (L) 03/25/2017    HCT 37.3 (L) 05/15/2020    HCT 37.9 (L) 06/09/2014    Platelet 241 05/25/2020    Platelet 263 05/09/2020    Platelet 190 06/09/2014    Platelet 275 06/08/2014    Sodium 137 05/19/2020    Sodium Whole Blood 132 (L) 03/25/2017    Sodium, POCT 134 (L) 07/25/2019    Potassium 4.9 (H) 05/25/2020    Potassium, Bld 5.5 (H) 03/25/2017    Potassium, POCT 5.3 (H) 07/25/2019    BUN 36 (H) 05/08/2020    BUN, POCT 56 (H) 07/25/2019    Creatinine, POCT 5.8 (H) 07/25/2019    Creatinine, POCT 6.2 (H) 07/22/2019    Creatinine 6.55 (H) 05/25/2020    Creatinine 5.71 (H) 05/09/2020    EGFR African American >=60 06/09/2014    EGFR Non-African American 59 (L) 06/09/2014    Glucose 132 05/14/2020    Glucose, POCT 212 (H) 07/25/2019    Magnesium 1.8 05/06/2020    Magnesium 2.0 06/09/2014    Albumin 3.2 (L) 05/02/2020    Albumin 4.3 06/08/2014    Total Bilirubin 0.2 (L) 05/01/2020    Total Bilirubin 0.8 06/08/2014    AST 19 05/15/2020    AST 37 06/08/2014    ALT 7 (L) 05/21/2020    ALT 36 06/08/2014    Alkaline Phosphatase 120 (H) 05/22/2020    Alkaline Phosphatase 98 06/08/2014    INR, POC 1.2 02/12/2018    INR 1.51 05/17/2020    Creatine Kinase, Total 45.0 (L) 05/25/2015    Creatine Kinase, Total 40 (L) 09/15/2012    Sed Rate 122 (H) 11/23/2017    Sed Rate 16 (H) 03/17/2016    Sed Rate 34 (H) 09/06/2011    CRP 244.0 (H) 11/23/2017    CRP 249.0 (H) 11/23/2017    CRP 3.2 (H) 09/06/2011       Imaging:  ECG 12 Lead    Result Date: 05/17/2020  ATRIAL-PACED RHYTHM WITH PROLONGED AV CONDUCTION MODERATE VOLTAGE CRITERIA FOR LVH, Bellot BE NORMAL VARIANT ( R in aVL , [employer] product ) CANNOT RULE OUT ANTERIOR INFARCT  , AGE UNDETERMINED T WAVE ABNORMALITY, CONSIDER LATERAL ISCHEMIA PROLONGED QT ABNORMAL ECG WHEN COMPARED WITH ECG OF 16-May-2020 14:20, NO SIGNIFICANT CHANGE WAS FOUND    ECG 12 Lead    Result Date: 05/15/2020  ATRIAL-PACED RHYTHM WITH PROLONGED AV CONDUCTION INCOMPLETE LEFT BUNDLE BRANCH BLOCK VOLTAGE CRITERIA FOR LEFT VENTRICULAR HYPERTROPHY T WAVE ABNORMALITY, CONSIDER LATERAL ISCHEMIA PROLONGED QT ABNORMAL ECG WHEN COMPARED WITH ECG OF 16-May-2020 13:23, INCOMPLETE LEFT BUNDLE BRANCH BLOCK IS NOW PRESENT    ECG 12 Lead    Result Date: 05/19/2020  ATRIAL-PACED RHYTHM WITH PROLONGED AV CONDUCTION MODERATE VOLTAGE CRITERIA FOR LVH, Taylor BE NORMAL VARIANT ( R in aVL , [employer] product ) CANNOT RULE OUT ANTERIOR INFARCT  (CITED ON OR BEFORE 09-May-2020) T WAVE ABNORMALITY, CONSIDER LATERAL ISCHEMIA PROLONGED QT ABNORMAL ECG WHEN COMPARED WITH ECG OF 09-May-2020 09:59, ELECTRONIC ATRIAL PACEMAKER  HAS REPLACED SINUS RHYTHM SERIAL CHANGES OF ANTERIOR INFARCT  PRESENT    XR Ankle 2 Views Left    Result Date: 05/17/2020  EXAM: XR ANKLE 2 VIEWS LEFT DATE: 05/17/2020 12:00 AM ACCESSION: [id number] UN DICTATED: 05/17/2020 12:51 AM INTERPRETATION LOCATION: [facility] CLINICAL INDICATION: 50 years old Male with fall, ankle pain and swelling  COMPARISON: None. TECHNIQUE: AP, oblique and lateral views of the left ankle. FINDINGS: No fracture. Ankle is approximated. Talar dome is smooth. Degenerative changes of the midfoot and ankle joint. Diffuse lower extremity edema and swelling.     Diffuse swelling without acute osseous abnormality identified.    CTA Aortic Dissection    Result Date:   EXAM: CTA Chest, Abdomen, Pelvis for Aortic Dissection DATE: 05/26/2020 4:46 PM ACCESSION: [id number] UN DICTATED: 05/18/2020 4:45 PM INTERPRETATION LOCATION: [facility] CLINICAL INDICATION: 50 years old Male with c/f aortic dissection ; Aortic disease, nontraumatic ; Chest pain or back pain, aortic dissection suspected  COMPARISON: None TECHNIQUE: A helical CT of the chest was obtained without IV contrast from the thoracic inlet through the hemidiaphragms. Images were reconstructed in the axial plane. Next, a spiral CTA  of the chest, abdomen and pelvis was obtained with IV contrast from the thoracic inlet through the aortic bifurcation. Images were reconstructed in the axial plane.  Multiplanar reformatted and MIP images are provided for further evaluation of the aorta. FINDINGS: AORTA: Normal caliber aorta. No thoracic aortic intramural hematoma.  No aortic dissection. CHEST: Normal heart size.  No pericardial effusion. No mediastinal lymphadenopathy. Left chest wall ICD. Partially visualized right dual-lumen central venous catheter with the distal tip terminating in the in the suprahepatic IVC and the proximal tip likely terminating within the right atrium. Clear central airways. No consolidation.  No pleural effusion. Bibasilar groundglass opacities likely represent atelectasis. ABDOMEN and PELVIS: HEPATOBILIARY: No focal hepatic lesions. The gallbladder is present and otherwise unremarkable. No biliary dilatation.  SPLEEN: Unremarkable. PANCREAS: Unremarkable. 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 KIDNEYS/URETERS: Mildly atrophic bilateral kidneys with multiple hypoattenuating renal cysts and bilateral perinephric stranding which is likely physiological. GI TRACT: No dilated or thick walled loops of bowel. Normal appendix (4:57 PERITONEUM/RETROPERITONEUM AND MESENTERY: No free air or fluid. LYMPH NODES: No enlarged lymph nodes. VESSELS: The aorta is normal in caliber.  No significant calcified atherosclerotic disease. The portal venous system is patent. The hepatic veins and IVC are unremarkable. BONES AND SOFT TISSUES: Unremarkable.  No bowel obstruction.  No free fluid.  No abdominal or pelvic lymphadenopathy. BONES: Unremarkable. SOFT TISSUES: Skin thickening and subcutaneous fat stranding over the left lower quadrant abdominal pannus is indeterminate. Recommend clinical correlation (4:208).     -- No aortic dissection. -- Bilateral adrenal myelolipomas, as above.

## 2020-05-17 NOTE — Unmapped (Signed)
Speech Language Pathology Clinical Swallow Assessment  Evaluation (05/17/20 0730)    Patient Name:  Jeremy Oliver       Medical Record Number: [id number]   Date of Birth: 1970/12/30  Sex: Male            SLP Treatment Diagnosis: r/o dysphagia, +dysarthria  Activity Tolerance: Patient tolerated treatment well    Assessment  Pt. presents with mild oral dysphagia in setting of L sided facial weakness and reduced sensation, L labial weakness, and mildly reduced lingual coordination resulting in mildly prolonged bolus manipulation and mastication of advanced solids and potential for L sided pocketing. Pt. aware of risk of pocketing and able to perform effective lingual sweep on L side, as needed. No overt s/sx of aspiration noted across consistencies, despite thorough challenging. Speech notable for mild dysarthria, though with limited overall impact on intelligibility.   Recommend regular consistency solids and thin liquids diet w/ aspiration precautions and compensatory strategies employed. Meds administered whole, with sips, or crushed, as needed for larger pills, as pt. reports is consistent w/ baseline. Aspiration precautions to include upright positioning, slow rate, small bites/sips, thorough mastication of solids. Pt. likely to benefit from lingual sweep on L side and/or liquid wash after solids to aid with potential L sided pocketing.   Pt. demonstrates intact awareness and ability to utilize compensatory strategies independently. Pt. is oriented x4 w/ fluent and appropriate verbal output, able to follow commands and participate in conversation w/o difficulty. SLP will follow up to intiiate formal cognitive-communication evaluation.  Risk for Aspiration: Mild     Recommendations:         Diet Liquids Recommendations: No Restrictions    Diet Solids Recommendation: No Restrictions    Recommended Form of Medications: Whole,With liquid (or crushed for larger pills, per baseline)      Compensatory Swallowing Strategies: Upright as possible for all oral intake,Remain upright for 20-30 minutes after meals,Eat/feed slowly,Small bites/sips,Alternate solids and liquids    Post Acute Discharge Recommendations  Post Acute SLP Discharge Recommendations: 5x weekly    Prognosis: Good  Positive Indicators: current clinical findings  Barriers to Discharge: Functional strength deficits     Plan of Care  SLP Follow-up / Frequency: 1x per day, 2-3x week Planned Treatment Duration : 1-2 weeks    Treatment Goals:  Short Term Goal 1: Pt. will participate in cognitive-communication evaluation to assess for changes from baseline and assist w/ dc planning.   Time Frame : 5 days         Patient and Family Goal: Pt. has goal to continue to eat/drink without difficulty    Subjective  Current Functional Status: Jeremy Oliver is a 50 y.o. male with PMHx as noted below who presents to [facility] with Weakness. Per H&P, Left sided weakness - Dysarthria - concern for possible stroke, though symptoms have been ongoing >1 week.  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  With his back pain, also concern for possible spinal pathology that could explain peripheral symptoms.  He has been unable to get an MRI because prior free pacemaker leads are incompatible per OSH. Will note that his neuro exam was somewhat inconsistent.  Question whether this could be some type of post-COVID complication given he had mild COVID in January. Pt. currently on regular diet.  Prior Function: On disability,Independent prior to admission   Lives With: Spouse  Educational Status: High school not completed     Communication Preference: Verbal  Patient/Caregiver Reports: Pt. reports I've had these symptoms since last Wednesday I ate a sandwich last night and did fine, I just take my time I crush larger pills at home Reports being on regular diet at baseline. Allergies: Losartan and Morphine  Current Facility-Administered Medications   Medication Dose Route Frequency Provider Last Rate Last Admin   ??? acetaminophen (TYLENOL) tablet 650 mg  650 mg Oral Q8H Lamar Laundry, MD   650 mg at 06-07-20 2239   ??? albuterol (PROVENTIL HFA;VENTOLIN HFA) 90 mcg/actuation inhaler 1 puff  1 puff Inhalation Q4H PRN Lamar Laundry, MD       ??? allopurinoL (ZYLOPRIM) tablet 200 mg  200 mg Oral Daily Lamar Laundry, MD       ??? amiodarone (PACERONE) tablet 200 mg  200 mg Oral BID with meals Lamar Laundry, MD       ??? apixaban Everlene Balls) tablet 5 mg  5 mg Oral BID Lamar Laundry, MD   5 mg at 2020/06/07 2308   ??? atorvastatin (LIPITOR) tablet 80 mg  80 mg Oral Daily Lamar Laundry, MD       ??? bumetanide (BUMEX) tablet 2 mg  2 mg Oral BID Lamar Laundry, MD       ??? cholecalciferol (vitamin D3 25 mcg (1,000 units)) tablet 25 mcg  25 mcg Oral Daily Lamar Laundry, MD       ??? dextrose 50 % in water (D50W) 50 % solution 12.5 g  12.5 g Intravenous Q10 Min PRN Lamar Laundry, MD       ??? ezetimibe (ZETIA) tablet 10 mg  10 mg Oral Nightly Lamar Laundry, MD   10 mg at 06-07-2020 2308   ??? FLUoxetine (PROzac) capsule 80 mg  80 mg Oral Daily Lamar Laundry, MD       ??? gabapentin (NEURONTIN) capsule 100 mg  100 mg Oral BID Lamar Laundry, MD       ??? insulin glargine (LANTUS) injection 20 Units  20 Units Subcutaneous Nightly Lamar Laundry, MD   20 Units at 06-07-2020 2307   ??? insulin lispro (HumaLOG) injection 1-20 Units  1-20 Units Subcutaneous ACHS Lamar Laundry, MD       ??? insulin lispro (HumaLOG) injection 5 Units  5 Units Subcutaneous TID AC Lamar Laundry, MD       ??? isosorbide mononitrate (IMDUR) 24 hr tablet 60 mg  60 mg Oral Tu,Th,Sa,Su Lamar Laundry, MD       ??? melatonin tablet 3 mg  3 mg Oral Nightly PRN Lamar Laundry, MD   3 mg at 06-07-20 2308   ??? methocarbamoL (ROBAXIN) tablet 500 mg  500 mg Oral Q8H PRN Lamar Laundry, MD   500 mg at 2020-06-07 2239   ??? metoprolol succinate (TOPROL-XL) 24 hr tablet 100 mg  100 mg Oral Daily Lamar Laundry, MD       ??? mirtazapine (REMERON) tablet 7.5 mg  7.5 mg Oral Nightly Lamar Laundry, MD   7.5 mg at 07-Jun-2020 2308   ??? ondansetron (ZOFRAN-ODT) disintegrating tablet 8 mg  8 mg Oral Q8H PRN Lamar Laundry, MD        Or   ??? ondansetron [facility]) injection 4 mg  4 mg Intravenous Q8H PRN Lamar Laundry, MD       ??? oxyCODONE (ROXICODONE) immediate release tablet  5 mg  5 mg Oral Q6H PRN Lamar Laundry, MD   5 mg at 05/17/20 5409   ??? pantoprazole (PROTONIX) EC tablet 40 mg  40 mg Oral Daily Lamar Laundry, MD       ??? polyethylene glycol (MIRALAX) packet 17 g  17 g Oral Daily PRN Lamar Laundry, MD       ??? ranolazine (RANEXA) 12 hr tablet 500 mg  500 mg Oral BID Lamar Laundry, MD   500 mg at 06-08-2020 2308   ??? sevelamer (RENVELA) tablet 2,400 mg  2,400 mg Oral 3xd Meals Lamar Laundry, MD   2,400 mg at 2020-06-08 2310   ??? ticagrelor (BRILINTA) tablet 90 mg  90 mg Oral BID Lamar Laundry, MD   90 mg at 2020/06/08 2308     Past Medical History:   Diagnosis Date   ??? Angina at rest (CMS-[name])    ??? Arthritis    ??? Asthma    ??? Atrial fibrillation and flutter (CMS-[name]) 06/08/14   ??? BMI 40.0-44.9, adult (CMS-[name]) 10/29/2015   ??? Chronic anticoagulation 02/17/2018   ??? Chronic pain syndrome 04/11/2014    ~Use daily lyrica as recommended ~Tramadol as needed for pain ~Fluoxetine daily as recommended ~Daily exercise ~Eat a healthy diet ~Good control of your blood sugars can help    ??? Coronary artery disease    ??? Depression    ??? Diabetes mellitus (CMS-[name])    ??? ESRD (end stage renal disease) on dialysis (CMS-[name])    ??? Eye trauma 1998    glass in both eyes and removed   ??? GERD (gastroesophageal reflux disease)    ??? Gout    ??? Homeless    ??? Hyperlipidemia    ??? Hypertension    ??? Illiteracy and low-level literacy    ??? Microscopic hematuria    ??? Migraine    ??? Nephrolithiasis    ??? 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 Nonalcoholic fatty liver disease    ??? NSTEMI (non-ST elevated myocardial infarction) (CMS-[name]) 01/13/2017   ??? Obesity    ??? Obstructive sleep apnea 2009   ??? Panic attacks    ??? 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 Seizure-like activity (CMS-[name])     ~Monitor for symptoms and report them to your primary care provider if they occur    ??? Systolic CHF, chronic (CMS-[name])     EF 40-45% 2017   ??? TIA (transient ischemic attack)      Family History   Problem Relation Age of Onset   ??? Diabetes Mother    ??? Hyperlipidemia Mother    ??? Heart disease Mother    ??? Basal cell carcinoma Mother    ??? Blindness Mother         diabeties   ??? Obesity Mother    ??? Hyperlipidemia Father    ??? Heart disease Father    ??? Lung disease Father    ??? Heart disease Half-Sister    ??? Kidney disease Half-Sister    ??? Gallbladder disease Half-Brother    ??? Obesity Half-Brother    ???  No Known Problems Daughter    ??? No Known Problems Son    ??? Obesity Daughter    ??? Depression Maternal Uncle         Died by suicide in Sep 29, 2017 by gunshot   ??? Melanoma Neg Hx    ??? Squamous cell carcinoma Neg Hx    ??? Macular degeneration Neg Hx    ??? Glaucoma Neg Hx      Past Surgical History:   Procedure Laterality Date   ??? CARDIAC CATHETERIZATION  03/08/2007   ??? CORONARY STENT PLACEMENT     ??? CYSTOURETHROSCOPY  12/31/2009     Microscopic hematuria   ??? KNEE SURGERY Right 09/23/2006    arthroscopic knee surgery medial and lateral meniscus tears as well as crystal disease   ??? PR CATH PLACE/CORON ANGIO, IMG SUPER/INTERP,R&L HRT CATH, L HRT VENTRIC N/A 02/13/2017    Procedure: Left/Right Heart Catheterization;  Surgeon: Marlaine Hind, MD;  Location: [facility] CATH;  Service: Cardiology   ??? PR CATH PLACE/CORON ANGIO, IMG SUPER/INTERP,W LEFT HEART VENTRICULOGRAPHY N/A 03/17/2017    Procedure: Left Heart Catheterization W Intervention;  Surgeon: Marlaine Hind, MD;  Location: [facility] CATH;  Service: Cardiology   ??? PR CATH PLACE/CORON ANGIO, IMG SUPER/INTERP,W LEFT HEART VENTRICULOGRAPHY N/A 05/12/2018    Procedure: CATH LEFT HEART CATHETERIZATION W INTERVENTION;  Surgeon: Dorathy Kinsman, MD;  Location: [facility] CATH;  Service: Cardiology   ??? PR CATH PLACE/CORON ANGIO, IMG SUPER/INTERP,W LEFT HEART VENTRICULOGRAPHY N/A 10/22/2018    Procedure: CATH LEFT HEART CATHETERIZATION W INTERVENTION;  Surgeon: Marlaine Hind, MD;  Location: [facility] CATH;  Service: Cardiology   ??? PR CATH PLACE/CORON ANGIO, IMG SUPER/INTERP,W LEFT HEART VENTRICULOGRAPHY N/A 08/19/2019    Procedure: Left Heart Catheterization W Intervention;  Surgeon: Marlaine Hind, MD;  Location: [facility] CATH;  Service: Cardiology   ??? PR CATH PLACE/CORON ANGIO, IMG SUPER/INTERP,W LEFT HEART VENTRICULOGRAPHY N/A 03/27/2020    Procedure: CATH LEFT HEART CATHETERIZATION W INTERVENTION;  Surgeon: Marlaine Hind, MD;  Location: [facility] CATH;  Service: Cardiology   ??? PR COMPRE EP EVAL ABLTJ 3D MAPG TX SVT N/A 07/19/2015    Catheter ablation of cavotricuspid isthmus for atrial flutter [facility])   ??? PR ECMO/ECLS INITIATION VENO-ARTERIAL N/A 03/19/2017    Procedure: ECMO/ECLS; INITIATION, VENO-ARTERIAL;  Surgeon: Lennie Odor, MD;  Location: MAIN OR [facility];  Service: Cardiac Surgery   ??? PR ECMO/ECLS RMVL PRPH CANNULA OPEN 6 YRS & OLDER Left 03/25/2017    Procedure: ECMO / ECLS PROVIDED BY PHYSICIAN; REMOVAL OF PERIPHERAL (ARTERIAL AND/OR VENOUS) CANNULA(E), OPEN, 6 YEARS AND OLDER;  Surgeon: Alonna Buckler Ikonomidis, MD;  Location: MAIN OR [facility];  Service: Cardiac Surgery   ??? PR INSER HART PACER XVENOUS ATRIAL N/A 05/30/2015    [employer] dual-chamber pacemaker implant (E.Chung)   ??? PR INSERT/PLACE FLOW DIRECT CATH N/A 03/17/2017    Procedure: Insert Leave In Ideal;  Surgeon: Marlaine Hind, MD;  Location: [facility] CATH;  Service: Cardiology   ??? PR INSJ/RPLCMT PERM DFB W/TRNSVNS LDS 1/DUAL CHMBR N/A 05/04/2017    Procedure: ICD Implant System (Single/Dual);  Surgeon: Eldred Manges, MD;  Location: [facility] CATH;  Service: Cardiology   ??? PR NEGATIVE PRESSURE WOUND THERAPY DME >50 SQ CM Left 03/25/2017    Procedure: Neg Press Wound Tx (Vac Assist) Incl Topicals, Per Session, Tsa Greater Than/= 50 Cm Squared;  Surgeon: Alonna Buckler Ikonomidis, MD;  Location: MAIN OR [facility];  Service: Cardiac Surgery   ??? PR PRQ TRLUML  CORONARY STENT Eather Colas ONE ART/BRNCH N/A 03/12/2018    Procedure: Percutaneous Coronary Intervention;  Surgeon: Marlaine Hind, MD;  Location: [facility] CATH;  Service: Cardiology   ??? PR PRQ TRLUML CORONARY STENT W/ANGIO ONE ART/BRNCH N/A 01/27/2020    Procedure: Percutaneous Coronary Intervention;  Surgeon: Marlaine Hind, MD;  Location: [facility] CATH;  Service: Cardiology   ??? PR REBL VES DIRECT,LOW EXTREM Left 03/19/2017    Procedure: Repr Bld Vessel Direct; Lower Extrem;  Surgeon: Boykin Reaper, MD;  Location: MAIN OR [facility];  Service: Vascular   ??? PR REMV ART CLOT ILIAC-POP,LEG INCIS Left 03/25/2017    Procedure: EMBOLECTOMY OR THROMBECTOMY, WITH OR WITHOUT CATHETER; FEMOROPOPLITEAL, AORTOILIAC ARTERY, BY LEG INCISION;  Surgeon: Alonna Buckler Ikonomidis, MD;  Location: MAIN OR [facility];  Service: Cardiac Surgery   ??? PR UPPER GI ENDOSCOPY,BIOPSY N/A 02/28/2016    Procedure: UGI ENDOSCOPY; WITH BIOPSY, SINGLE OR MULTIPLE;  Surgeon: Liane Comber, MD;  [facility];  Service: Gastroenterology   ??? PR UPPER GI ENDOSCOPY,DIAGNOSIS N/A 05/07/2016    Procedure: UGI ENDO, INCLUDE ESOPHAGUS, STOMACH, & DUODENUM &/OR JEJUNUM; DX W/WO COLLECTION SPECIMN, BY BRUSH OR WASH;  Surgeon: Modena Nunnery, MD;  [facility];  Service: Gastroenterology     Social History     Tobacco Use   ??? Smoking status: Never Smoker   ??? Smokeless tobacco: Current User     Types: Chew   ??? Tobacco comment: 04/10/19:  Dips 3x a day./ /Pt in process of reducing tobacco use,  dips 1 can every 3 weeks,   Substance Use Topics   ??? Alcohol use: Yes     Alcohol/week: 0.0 standard drinks     Comment: rare use: couple times a year, small amount         General:                    Vision: Functional for self-feeding                   Medical Tests / Procedures Comments: CTA Aortic Dissection:  No aortic dissection. No head imaging currently available.  Equipment/Environment: None  Services patient receives: SLP      Objective     Respiratory Status : Room air  History of Intubation: No          Behavior/Cognition: Lethargic,Cooperative,Pleasant mood  Positioning : Upright in bed    Oral / Motor Exam  Vocal Quality: Normal  Volitional Swallow: Within Functional Limits   Labial ROM: Reduced left   Labial Symmetry: Abnormal symmetry left  Labial Strength: Within Functional Limits (able to establish and maintain labial seal for bolus containment and straw use)   Lingual ROM: Within Functional Limits  Lingual Symmetry: Within Functional Limits  Lingual Strength: Within Functional Limits      Velum: Within Functional Limits   Mandible: Within Functional Limits  Coordination: reduced  Facial ROM: Reduced left   Facial Symmetry: Left droop  Facial Strength: Reduced  Facial Sensation: Reduced   Vocal Intensity: Within Functional Limits       Apraxia: None present   Dysarthria: Minimal Dysarthria   Intelligibility: Intelligible   Breath Support: Adequate for speech   Dentition: Adequate    Consistencies assessed: ice, thin liquids via straw, pureed solids via spoon, hard solids, mixed consistencies (hard solids/thin liquids)    Medical Staff Made Aware: RN, MD    Speech Therapy Session Duration  SLP Individual [mins]:  17    I attest that I have reviewed the above information.  Signed: Susa Griffins, CCC-SLP    Filed 05/17/2020

## 2020-05-17 NOTE — Unmapped (Addendum)
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 PERITONEUM/RETROPERITONEUM AND MESENTERY: No free air or fluid. LYMPH NODES: No enlarged lymph nodes. VESSELS: The aorta is normal in caliber.  No significant calcified atherosclerotic disease. The portal venous system is patent. The hepatic veins and IVC are unremarkable. BONES AND SOFT TISSUES: Unremarkable.  No bowel obstruction.  No free fluid.  No abdominal or pelvic lymphadenopathy. BONES: Unremarkable. SOFT TISSUES: Skin thickening and subcutaneous fat stranding over the left lower quadrant abdominal pannus is indeterminate. Recommend clinical correlation (4:208).     -- No aortic dissection. -- Bilateral adrenal myelolipomas, as above.       EKG: EKG was personally reviewed and noted to be, unchanged from previous tracing apaced rhythm, left axis deviation, mild st flattening V5-6, prolonged qtc

## 2020-05-18 LAB — CBC W/ AUTO DIFF
BASOPHILS ABSOLUTE COUNT: 0 10*9/L (ref 0.0–0.1)
BASOPHILS RELATIVE PERCENT: 0.1 %
EOSINOPHILS ABSOLUTE COUNT: 0 10*9/L (ref 0.0–0.4)
EOSINOPHILS RELATIVE PERCENT: 0.1 %
HEMATOCRIT: 38.8 % — ABNORMAL LOW (ref 41.0–53.0)
HEMOGLOBIN: 12.1 g/dL — ABNORMAL LOW (ref 13.5–17.5)
LARGE UNSTAINED CELLS: 1 % (ref 0–4)
LYMPHOCYTES ABSOLUTE COUNT: 0.4 10*9/L — ABNORMAL LOW (ref 1.5–5.0)
LYMPHOCYTES RELATIVE PERCENT: 5.2 %
MEAN CORPUSCULAR HEMOGLOBIN CONC: 31.2 g/dL (ref 31.0–37.0)
MEAN CORPUSCULAR HEMOGLOBIN: 29.5 pg (ref 26.0–34.0)
MEAN CORPUSCULAR VOLUME: 94.6 fL (ref 80.0–100.0)
MEAN PLATELET VOLUME: 8.8 fL (ref 7.0–10.0)
MONOCYTES ABSOLUTE COUNT: 0.1 10*9/L — ABNORMAL LOW (ref 0.2–0.8)
MONOCYTES RELATIVE PERCENT: 1.6 %
NEUTROPHILS ABSOLUTE COUNT: 7.6 10*9/L — ABNORMAL HIGH (ref 2.0–7.5)
NEUTROPHILS RELATIVE PERCENT: 92.4 %
PLATELET COUNT: 297 10*9/L (ref 150–440)
RED BLOOD CELL COUNT: 4.1 10*12/L — ABNORMAL LOW (ref 4.50–5.90)
RED CELL DISTRIBUTION WIDTH: 16.9 % — ABNORMAL HIGH (ref 12.0–15.0)
WBC ADJUSTED: 8.2 10*9/L (ref 4.5–11.0)

## 2020-05-18 LAB — COMPREHENSIVE METABOLIC PANEL
ALBUMIN: 3.3 g/dL — ABNORMAL LOW (ref 3.4–5.0)
ALBUMIN: 3.2 g/dL — ABNORMAL LOW (ref 3.4–5.0)
ALKALINE PHOSPHATASE: 125 U/L — ABNORMAL HIGH (ref 46–116)
ALKALINE PHOSPHATASE: 118 U/L — ABNORMAL HIGH (ref 46–116)
ALT (SGPT): 8 U/L — ABNORMAL LOW (ref 10–49)
ALT (SGPT): <7 U/L — ABNORMAL LOW (ref 10–49)
ANION GAP: 10 mmol/L (ref 5–14)
ANION GAP: 10 mmol/L (ref 5–14)
AST (SGOT): 8 U/L (ref <=34)
AST (SGOT): 9 U/L (ref <=34)
BILIRUBIN TOTAL: 0.2 mg/dL — ABNORMAL LOW (ref 0.3–1.2)
BILIRUBIN TOTAL: 0.3 mg/dL (ref 0.3–1.2)
BLOOD UREA NITROGEN: 49 mg/dL — ABNORMAL HIGH (ref 9–23)
BLOOD UREA NITROGEN: 23 mg/dL (ref 9–23)
BUN / CREAT RATIO: 6
BUN / CREAT RATIO: 5
CALCIUM: 8.8 mg/dL (ref 8.7–10.4)
CALCIUM: 8.9 mg/dL (ref 8.7–10.4)
CHLORIDE: 99 mmol/L (ref 98–107)
CHLORIDE: 99 mmol/L (ref 98–107)
CO2: 22 mmol/L (ref 20.0–31.0)
CO2: 24 mmol/L (ref 20.0–31.0)
CREATININE: 8.57 mg/dL — ABNORMAL HIGH
CREATININE: 4.69 mg/dL — ABNORMAL HIGH
EGFR CKD-EPI AA MALE: 7 mL/min/{1.73_m2} — ABNORMAL LOW (ref >=60)
EGFR CKD-EPI AA MALE: 15 mL/min/{1.73_m2} — ABNORMAL LOW (ref >=60)
EGFR CKD-EPI NON-AA MALE: 6 mL/min/{1.73_m2} — ABNORMAL LOW (ref >=60)
EGFR CKD-EPI NON-AA MALE: 13 mL/min/{1.73_m2} — ABNORMAL LOW (ref >=60)
GLUCOSE RANDOM: 203 mg/dL — ABNORMAL HIGH (ref 70–179)
GLUCOSE RANDOM: 165 mg/dL (ref 70–179)
POTASSIUM: 6.2 mmol/L (ref 3.4–4.5)
POTASSIUM: 4.5 mmol/L (ref 3.4–4.5)
PROTEIN TOTAL: 7.5 g/dL (ref 5.7–8.2)
PROTEIN TOTAL: 7.3 g/dL (ref 5.7–8.2)
SODIUM: 131 mmol/L — ABNORMAL LOW (ref 135–145)
SODIUM: 133 mmol/L — ABNORMAL LOW (ref 135–145)

## 2020-05-18 LAB — APTT
APTT: 35.5 s (ref 24.9–36.9)
HEPARIN CORRELATION: 0.2

## 2020-05-18 LAB — PROTIME-INR
INR: 1.5
PROTIME: 17.5 s — ABNORMAL HIGH (ref 10.3–13.4)

## 2020-05-18 LAB — BLOOD GAS CRITICAL CARE PANEL, VENOUS
BASE EXCESS VENOUS: 0.8 (ref -2.0–2.0)
CALCIUM IONIZED VENOUS (MG/DL): 4.35 mg/dL — ABNORMAL LOW (ref 4.40–5.40)
GLUCOSE WHOLE BLOOD: 167 mg/dL (ref 70–179)
HCO3 VENOUS: 24 mmol/L (ref 22–27)
HEMOGLOBIN BLOOD GAS: 12.1 g/dL — ABNORMAL LOW
LACTATE BLOOD VENOUS: 1.6 mmol/L (ref 0.5–1.8)
O2 SATURATION VENOUS: 97 % — ABNORMAL HIGH (ref 40.0–85.0)
PCO2 VENOUS: 36 mmHg — ABNORMAL LOW (ref 40–60)
PH VENOUS: 7.45 — ABNORMAL HIGH (ref 7.32–7.43)
PO2 VENOUS: 98 mmHg — ABNORMAL HIGH (ref 30–55)
POTASSIUM WHOLE BLOOD: 4.5 mmol/L (ref 3.4–4.6)
SODIUM WHOLE BLOOD: 135 mmol/L (ref 135–145)

## 2020-05-18 LAB — CBC
HEMATOCRIT: 36.2 % — ABNORMAL LOW (ref 41.0–53.0)
HEMOGLOBIN: 11.7 g/dL — ABNORMAL LOW (ref 13.5–17.5)
MEAN CORPUSCULAR HEMOGLOBIN CONC: 32.2 g/dL (ref 31.0–37.0)
MEAN CORPUSCULAR HEMOGLOBIN: 28.9 pg (ref 26.0–34.0)
MEAN CORPUSCULAR VOLUME: 89.9 fL (ref 80.0–100.0)
MEAN PLATELET VOLUME: 8.3 fL (ref 7.0–10.0)
PLATELET COUNT: 298 10*9/L (ref 150–440)
RED BLOOD CELL COUNT: 4.03 10*12/L — ABNORMAL LOW (ref 4.50–5.90)
RED CELL DISTRIBUTION WIDTH: 16.3 % — ABNORMAL HIGH (ref 12.0–15.0)
WBC ADJUSTED: 14.1 10*9/L — ABNORMAL HIGH (ref 4.5–11.0)

## 2020-05-18 LAB — SLIDE REVIEW

## 2020-05-18 LAB — HIGH SENSITIVITY TROPONIN I - SINGLE: HIGH SENSITIVITY TROPONIN I: 13 ng/L (ref <=53)

## 2020-05-18 MED ADMIN — heparin (porcine) 1000 unit/mL injection 8,000 Units: 8000 [IU] | INTRAVENOUS | @ 19:00:00

## 2020-05-18 MED ADMIN — melatonin tablet 3 mg: 3 mg | ORAL | @ 03:00:00

## 2020-05-18 MED ADMIN — cholecalciferol (vitamin D3 25 mcg (1,000 units)) tablet 25 mcg: 25 ug | ORAL | @ 15:00:00

## 2020-05-18 MED ADMIN — methocarbamoL (ROBAXIN) tablet 500 mg: 500 mg | ORAL | @ 03:00:00

## 2020-05-18 MED ADMIN — FLUoxetine (PROzac) capsule 80 mg: 80 mg | ORAL | @ 15:00:00

## 2020-05-18 MED ADMIN — mirtazapine (REMERON) tablet 7.5 mg: 7.5 mg | ORAL | @ 03:00:00

## 2020-05-18 MED ADMIN — pantoprazole (PROTONIX) EC tablet 40 mg: 40 mg | ORAL | @ 15:00:00

## 2020-05-18 MED ADMIN — gabapentin (NEURONTIN) capsule 100 mg: 100 mg | ORAL | @ 14:00:00

## 2020-05-18 MED ADMIN — apixaban (ELIQUIS) tablet 5 mg: 5 mg | ORAL | @ 15:00:00

## 2020-05-18 MED ADMIN — allopurinoL (ZYLOPRIM) tablet 200 mg: 200 mg | ORAL | @ 14:00:00

## 2020-05-18 MED ADMIN — insulin lispro (HumaLOG) injection 1-20 Units: 1-20 [IU] | SUBCUTANEOUS | @ 15:00:00

## 2020-05-18 MED ADMIN — ranolazine (RANEXA) 12 hr tablet 500 mg: 500 mg | ORAL | @ 15:00:00

## 2020-05-18 MED ADMIN — oxyCODONE (ROXICODONE) immediate release tablet 5 mg: 5 mg | ORAL | @ 08:00:00 | Stop: 2020-05-30

## 2020-05-18 MED ADMIN — gentamicin-sodium citrate lock solution in NS: 2 mL | @ 23:00:00

## 2020-05-18 MED ADMIN — calcitrioL (ROCALTROL) capsule 1 mcg: 1 ug | ORAL | @ 22:00:00

## 2020-05-18 MED ADMIN — bumetanide (BUMEX) tablet 2 mg: 2 mg | ORAL | @ 15:00:00

## 2020-05-18 MED ADMIN — sevelamer (RENVELA) tablet 2,400 mg: 2400 mg | ORAL | @ 14:00:00

## 2020-05-18 MED ADMIN — methocarbamoL (ROBAXIN) tablet 500 mg: 500 mg | ORAL | @ 15:00:00

## 2020-05-18 MED ADMIN — oxyCODONE (ROXICODONE) immediate release tablet 5 mg: 5 mg | ORAL | @ 15:00:00 | Stop: 2020-05-30

## 2020-05-18 MED ADMIN — ezetimibe (ZETIA) tablet 10 mg: 10 mg | ORAL | @ 03:00:00

## 2020-05-18 MED ADMIN — atorvastatin (LIPITOR) tablet 80 mg: 80 mg | ORAL | @ 15:00:00

## 2020-05-18 MED ADMIN — acetaminophen (TYLENOL) tablet 650 mg: 650 mg | ORAL | @ 03:00:00

## 2020-05-18 MED ADMIN — predniSONE (DELTASONE) tablet 60 mg: 60 mg | ORAL | @ 14:00:00 | Stop: 2020-05-24

## 2020-05-18 MED ADMIN — amiodarone (PACERONE) tablet 200 mg: 200 mg | ORAL | @ 15:00:00

## 2020-05-18 MED ADMIN — insulin glargine (LANTUS) injection 20 Units: 20 [IU] | SUBCUTANEOUS | @ 03:00:00

## 2020-05-18 MED ADMIN — ticagrelor (BRILINTA) tablet 90 mg: 90 mg | ORAL | @ 14:00:00

## 2020-05-18 MED ADMIN — bumetanide (BUMEX) tablet 2 mg: 2 mg | ORAL | @ 03:00:00

## 2020-05-18 MED ADMIN — ranolazine (RANEXA) 12 hr tablet 500 mg: 500 mg | ORAL | @ 03:00:00

## 2020-05-18 MED ADMIN — metoprolol succinate (TOPROL-XL) 24 hr tablet 100 mg: 100 mg | ORAL | @ 15:00:00

## 2020-05-18 MED ADMIN — ticagrelor (BRILINTA) tablet 90 mg: 90 mg | ORAL | @ 03:00:00

## 2020-05-18 MED ADMIN — gabapentin (NEURONTIN) capsule 100 mg: 100 mg | ORAL | @ 03:00:00

## 2020-05-18 MED ADMIN — acetaminophen (TYLENOL) tablet 650 mg: 650 mg | ORAL | @ 15:00:00

## 2020-05-18 MED ADMIN — insulin lispro (HumaLOG) injection 5 Units: 5 [IU] | SUBCUTANEOUS | @ 15:00:00 | Stop: 2020-05-18

## 2020-05-18 MED ADMIN — apixaban (ELIQUIS) tablet 5 mg: 5 mg | ORAL | @ 03:00:00

## 2020-05-18 MED ADMIN — nicotine (NICODERM CQ) 14 mg/24 hr patch 1 patch: 1 | TRANSDERMAL | @ 15:00:00

## 2020-05-18 NOTE — Unmapped (Signed)
Pt here for scheduled 4 hour HD treatment.  Catheter accessed and treatment initiated without incident.  Will continue to monitor throughout progress.    Problem: Device-Related Complication Risk (Hemodialysis)  Goal: Safe, Effective Therapy Delivery  Outcome: Ongoing - Unchanged     Problem: Hemodynamic Instability (Hemodialysis)  Goal: Effective Tissue Perfusion  Outcome: Ongoing - Unchanged     Problem: Infection (Hemodialysis)  Goal: Absence of Infection Signs and Symptoms  Outcome: Ongoing - Unchanged
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
## 2020-05-18 NOTE — Unmapped (Signed)
Patient is currently resting with BiPAP in place, drowsy but oriented x4 this shift, VSS on RA. Still complains of significant back pain treated with PRN robaxin and oxycodone. Tolerating renal diet, turns self, calls appropriately. Call bell within reach, WCTM.      Problem: Adult Inpatient Plan of Care  Goal: Plan of Care Review  05/17/2020 2335 by Evalina Field, RN  Outcome: Progressing  05/17/2020 2335 by Evalina Field, RN  Outcome: Progressing  Goal: Patient-Specific Goal (Individualized)  05/17/2020 2335 by Evalina Field, RN  Outcome: Progressing  05/17/2020 2335 by Evalina Field, RN  Outcome: Progressing  Goal: Absence of Hospital-Acquired Illness or Injury  05/17/2020 2335 by Evalina Field, RN  Outcome: Progressing  05/17/2020 2335 by Evalina Field, RN  Outcome: Progressing  Intervention: Identify and Manage Fall Risk  Recent Flowsheet Documentation  Taken 05/17/2020 2000 by Evalina Field, RN  Safety Interventions:   bed alarm   commode/urinal/bedpan at bedside   environmental modification   fall reduction program maintained   lighting adjusted for tasks/safety   low bed   nonskid shoes/slippers when out of bed  Intervention: Prevent Skin Injury  Recent Flowsheet Documentation  Taken 05/17/2020 2000 by Evalina Field, RN  Skin Protection:   adhesive use limited   incontinence pads utilized  Intervention: Prevent and Manage VTE (Venous Thromboembolism) Risk  Recent Flowsheet Documentation  Taken 05/17/2020 2000 by Evalina Field, RN  Activity Management: activity adjusted per tolerance  VTE Prevention/Management:   anticoagulant therapy   fluids promoted  Goal: Optimal Comfort and Wellbeing  05/17/2020 2335 by Evalina Field, RN  Outcome: Progressing  05/17/2020 2335 by Evalina Field, RN  Outcome: Progressing  Goal: Readiness for Transition of Care  05/17/2020 2335 by Evalina Field, RN  Outcome: Progressing  05/17/2020 2335 by Evalina Field, RN  Outcome: Progressing  Goal: Rounds/Family Conference  05/17/2020 2335 by Evalina Field, RN  Outcome: Progressing  05/17/2020 2335 by Evalina Field, RN  Outcome: Progressing

## 2020-05-18 NOTE — Unmapped (Signed)
Patient continued on NPPV at bedtime this shift. Patient continued on BIPAP 16 / +5 /  R 10 / 30%. See flow sheets for more detail. No other changes or respiratory interventions made at this time. Will continue to monitor the patient's respiratory status.     Problem: Noninvasive Ventilation Acute  Goal: Effective Unassisted Ventilation and Oxygenation  Outcome: Ongoing - Unchanged  Intervention: Monitor and Manage Noninvasive Ventilation  Recent Flowsheet Documentation  Taken 05/18/2020 0203 by Lily Peer, RRT  NPPV/CPAP Maintenance: mask secure  Taken 05/17/2020 2352 by Lily Peer, RRT  NPPV/CPAP Maintenance:   mask adjusted   mask secure

## 2020-05-18 NOTE — Unmapped (Signed)
[facility] Medicine Progress Note    Assessment/Plan:    Principal Problem:    Weakness  Active Problems:    Chest pain    Tobacco use disorder    Transient alteration of awareness    Dysarthria    Paresthesia of left upper extremity    Thoracic back pain  Resolved Problems:    * No resolved hospital problems. *      Jeremy Oliver is a 50 y.o. male with PMHx as noted below who presents to [facility] with Weakness.    Left sided weakness - Dysarthria: Concern for possible stroke, though symptoms have been ongoing >1 week. 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 With his back pain, also concern for possible spinal pathology that could explain peripheral symptoms. During recent admission at an OSH he was unable to get an MRI because prior free pacemaker leads are incompatible per OSH. Will note that his neuro exam was somewhat inconsistent. Question whether this could be some type of post-COVID complication given he had mild COVID in January.  - Neurology consulted, appreciate their assistance. Facial palsy felt to be consistent with Bell's Palsy as below. Left lower leg weakness and foot drop are chronic. Unclear etiology to left upper extremity weakness but concern that at least a component of this is volitional   - Continue to trend neuro exam  - PT/OT/speech evaluation: Previously not interested in rehab but now open to this   - Speech: Rec aspiration precautions, otherwise regular consistent of food and liquids   - PT/OT: Recommending 5x low  - Cardiology device team consulted to evaluate if pacemaker is MRI compatible, appreciate their assistance  - Holding on further imaging after discussion with neurology consult team. [place] need to consider spinal imaging vs uploading OSH images for over-read if continued back pain and weakness    Facial palsy consistent with Bell's palsy: Likely associated with recent COVID-19 infection, although could be due to a number of etiologies.  - Appreciate neurology consult team assistance in evaluation  - Continue prednisone 60mg  daily x7 days  - Continue valacyclovir 500mg  daily x7 days (on HD days should get after dialysis). End date: 05/24/20    Back pain: Certainly has musculoskeletal components; however initial CT at outside hospital reported possible T1 mass. A repeat thoracic CT at OSH then was read as normal and initial findings thought to be possible artifact. Will note both mention artifact present due to body habitus.  He has no bowel/bladder incontinence to suggest acute cord pathology and his unilateral findings do argue more for a central process  - Would be helpful to have radiology overread images - will continue to attempt to push to PACS  - If unable to obtain imaging, [place] need spine/neurosurgery consult or repeat imaging  - Have consulted the cardiology device team to see if we are able to perform MRI here (at OSH reportedly pacing wires were MRI incompatible, will note he was able to have an MRI in 2016)  - Symptomatic pain control    Chest pain: Initially reported to ED, though improved on my exam. Given this and associated back pain and neuro findings, there was concern for dissection.  - CTA to evaluate for aortic dissection was negative  - Troponins have been negative with no acute changes on ECG    Left ankle pain: After fall. X-rays on admission  notable for diffuse soft tissue swelling but no acute osseous abnormalities.  - Treat conservatively     CAD s/p PCI (last 02/2020): Significant disease, most recent cath 02/2020 with severe 2v disease. 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  Will note he does still make urine and did receive contrasted CT after discussion of risk/benefits in ED  - Nephrology consulted, appreciate their assistance  - Continue home sevalamer    Prior seizure like episodes without EEG correlate: During prior hospitalizations has had unresponsive episodes with normal vital signs with full workup including cardiac workup, central head imaging, neurology consult and EEG. 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  -  Monitor closely    Anxiety:  - Continue home fluoxetine  - Consider palliative care consult for significant issues coping with medical diagnoses (has been referred outpatient)    Tobacco Abuse: Uses chewing tobacco, around 0.5 cans weekly per family.   - Tobacco cessation counseling evaluated patient, appreciate their assistance  - Start nicotine patch 14mg    - Start nicotine gum 4mg  every hour PRN      Code Status: Full Code, wife is decision maker if he is unable, currently has capacity    F/E/N:  - Renal diet    Proph: On apixaban for anticoagulation    Vaccinated with J&J vaccine x1, had covid in January and received monoclonal antibody infusion    Dispo: PT/OT and speech consulted to determine disposition, [place] need SNF  ___________________________________________________________________    Subjective / Events:   Overnight wore BiPAP. Continued to have complaint of upper back pain. Was drowsy but oriented per nursing. This AM reports slight improvement in pain, otherwise no change in diffuse weakness. No further episodes of unresponsiveness or acute chest pain.       Physical Exam:  Temp:  [36.3 ??C (97.3 ??F)] 36.3 ??C (97.3 ??F)  Heart Rate:  [68-71] 70  Resp:  [18-20] 18  BP: (119-149)/(82-96) 149/90  FiO2 (%):  [30 %] 30 %  SpO2:  [90 %-100 %] 93 %  Body mass index is 44.67 kg/m??.    GEN: Lying in bed in NAD. Much more alert than on day prior  HEENT: Clear OP, MMM  CV: Regular rate and rhythm, no m/r/g, 1+ peripheral edema  RESP: CTA BL, no w/r/r. Normal work of breathing  ABD: Soft, NT/ND, normoactive bowel sounds  SKIN: Scattered bruising over bilateral chest, posterior left neck, abdomen  MSK/ext: Warm, well perfused, left ankle slightly more edematous than right, tenderness with range of motion, mild bruising  NEURO: alert, oriented x4, PERRL, EOMI, moderate left facial droop including forehead and increased left eye weakness. Somewhat weaker left upper extremity compared to right, though exam is inconsistent  PSYCH: Anxious, intermittently tearful and  tremulous      Test Results:  Lab Results   Component Value Date    WBC 7.0 05/15/2020    WBC 7.9 05/09/2020    WBC 7.5 06/09/2014    WBC 8.9 06/08/2014    Absolute Neutrophils 5.5 05/25/2020    Absolute Neutrophils 5.4 06/08/2014    Absolute Lymphocytes 0.8 05/24/2020    Absolute Lymphocytes 2.5 06/08/2014    Absolute Eosinophils 0.2 05/20/2020    Absolute Eosinophils 0.2 06/08/2014    HGB 12.3 (L) 05/15/2020    Hemoglobin 8.1 (L) 03/25/2017    HCT 37.3 (L) 05/15/2020    HCT 37.9 (L) 06/09/2014    Platelet 241 05/11/2020    Platelet 263 05/09/2020    Platelet 190 06/09/2014    Platelet 275 06/08/2014    Sodium 134 (L) 05/17/2020    Sodium Whole Blood 132 (L) 03/25/2017    Sodium, POCT 134 (L) 07/25/2019    Potassium 5.1 (H) 05/17/2020    Potassium, Bld 5.5 (H) 03/25/2017    Potassium, POCT 5.3 (H) 07/25/2019    BUN 35 (H) 05/17/2020    BUN, POCT 56 (H) 07/25/2019    Creatinine, POCT 5.8 (H) 07/25/2019    Creatinine, POCT 6.2 (H) 07/22/2019    Creatinine 7.34 (H) 05/17/2020    Creatinine 6.55 (H) 05/05/2020 EGFR African American >=60 06/09/2014    EGFR Non-African American 59 (L) 06/09/2014    Glucose 128 05/17/2020    Glucose, POCT 212 (H) 07/25/2019    Magnesium 1.8 05/06/2020    Magnesium 2.0 06/09/2014    Albumin 2.9 (L) 05/17/2020    Albumin 4.3 06/08/2014    Total Bilirubin 0.2 (L) 05/17/2020    Total Bilirubin 0.8 06/08/2014    AST 14 05/17/2020    AST 37 06/08/2014    ALT 7 (L) 05/17/2020    ALT 36 06/08/2014    Alkaline Phosphatase 112 05/17/2020    Alkaline Phosphatase 98 06/08/2014    INR, POC 1.2 02/12/2018    INR 1.51 05/17/2020    Creatine Kinase, Total 45.0 (L) 05/25/2015    Creatine Kinase, Total 40 (L) 09/15/2012    Sed Rate 122 (H) 11/23/2017    Sed Rate 16 (H) 03/17/2016    Sed Rate 34 (H) 09/06/2011    CRP 244.0 (H) 11/23/2017    CRP 249.0 (H) 11/23/2017    CRP 3.2 (H) 09/06/2011       Imaging:  ECG 12 Lead    Result Date: 05/17/2020  ATRIAL-PACED RHYTHM WITH PROLONGED AV CONDUCTION LEFT VENTRICULAR HYPERTROPHY WITH QRS WIDENING AND REPOLARIZATION ABNORMALITY PROLONGED QT ABNORMAL ECG WHEN COMPARED WITH ECG OF 16-May-2020 14:20, NO SIGNIFICANT CHANGE WAS FOUND Confirmed by Pollyann Kennedy ([id number]) on 05/17/2020 8:32:34 PM    ECG 12 Lead    Result Date: 05/17/2020  ATRIAL-PACED RHYTHM WITH PROLONGED AV CONDUCTION LVH WITH REPOLARIZATION ABNORMALITY PROLONGED QT ABNORMAL ECG WHEN COMPARED WITH ECG OF 16-May-2020 13:23, NO SIGNIFICANT CHANGE WAS FOUND Confirmed by Pollyann Kennedy ([id number]) on 05/17/2020 8:05:04 PM    ECG 12 Lead    Result Date: 05/17/2020  SINUS RHYTHM  WITH PROLONGED AV CONDUCTION MODERATE VOLTAGE CRITERIA FOR LVH, [place] BE NORMAL VARIANT ( R in aVL , [employer] product ) CANNOT RULE OUT ANTERIOR INFARCT  (CITED ON OR BEFORE 09-May-2020) T WAVE ABNORMALITY, CONSIDER LATERAL ISCHEMIA PROLONGED QT ABNORMAL ECG WHEN COMPARED WITH ECG OF 09-May-2020 09:59, SERIAL CHANGES OF ANTERIOR INFARCT  PRESENT Confirmed by Mariane Baumgarten ([id number]) on 05/17/2020 2:34:05 PM    ECG  12 Lead    Result Date: 05/17/2020  ATRIAL-PACED RHYTHM WITH PROLONGED AV CONDUCTION LEFT VENTRICULAR HYPERTROPHY WITH QRS WIDENING AND REPOLARIZATION ABNORMALITY PROLONGED QT ABNORMAL ECG WHEN COMPARED WITH ECG OF 2020-06-03 18:42, NO SIGNIFICANT CHANGE WAS FOUND    XR Ankle 2 Views Left    Result Date: 05/17/2020  EXAM: XR ANKLE 2 VIEWS LEFT DATE: 05/17/2020 12:00 AM ACCESSION: [id number] UN DICTATED: 05/17/2020 12:51 AM INTERPRETATION LOCATION: [facility] CLINICAL INDICATION: 50 years old Male with fall, ankle pain and swelling  COMPARISON: None. TECHNIQUE: AP, oblique and lateral views of the left ankle. FINDINGS: No fracture. Ankle is approximated. Talar dome is smooth. Degenerative changes of the midfoot and ankle joint. Diffuse lower extremity edema and swelling.     Diffuse swelling without acute osseous abnormality identified.    CTA Aortic Dissection    Result Date: 2020/06/03  EXAM: CTA Chest, Abdomen, Pelvis for Aortic Dissection DATE: 06/03/2020 4:46 PM ACCESSION: [id number] UN DICTATED: 06/03/20 4:45 PM INTERPRETATION LOCATION: [facility] CLINICAL INDICATION: 50 years old Male with c/f aortic dissection ; Aortic disease, nontraumatic ; Chest pain or back pain, aortic dissection suspected  COMPARISON: None TECHNIQUE: A helical CT of the chest was obtained without IV contrast from the thoracic inlet through the hemidiaphragms. Images were reconstructed in the axial plane. Next, a spiral CTA  of the chest, abdomen and pelvis was obtained with IV contrast from the thoracic inlet through the aortic bifurcation. Images were reconstructed in the axial plane.  Multiplanar reformatted and MIP images are provided for further evaluation of the aorta. FINDINGS: AORTA: Normal caliber aorta. No thoracic aortic intramural hematoma.  No aortic dissection. CHEST: Normal heart size.  No pericardial effusion. No mediastinal lymphadenopathy. Left chest wall ICD. Partially visualized right dual-lumen central venous catheter with the distal tip terminating in the in the suprahepatic IVC and the proximal tip likely terminating within the right atrium. Clear central airways. No consolidation.  No pleural effusion. Bibasilar groundglass opacities likely represent atelectasis. ABDOMEN and PELVIS: HEPATOBILIARY: No focal hepatic lesions. The gallbladder is present and otherwise unremarkable. No biliary dilatation.  SPLEEN: Unremarkable. PANCREAS: Unremarkable. 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 KIDNEYS/URETERS: Mildly atrophic bilateral kidneys with multiple hypoattenuating renal cysts and bilateral perinephric stranding which is likely physiological. GI TRACT: No dilated or thick walled loops of bowel. Normal appendix (4:57 PERITONEUM/RETROPERITONEUM AND MESENTERY: No free air or fluid. LYMPH NODES: No enlarged lymph nodes. VESSELS: The aorta is normal in caliber.  No significant calcified atherosclerotic disease. The portal venous system is patent. The hepatic veins and IVC are unremarkable. BONES AND SOFT TISSUES: Unremarkable.  No bowel obstruction.  No free fluid.  No abdominal or pelvic lymphadenopathy. BONES: Unremarkable. SOFT TISSUES: Skin thickening and subcutaneous fat stranding over the left lower quadrant abdominal pannus is indeterminate. Recommend clinical correlation (4:208).     -- No aortic dissection. -- Bilateral adrenal myelolipomas, as above.     ICD / Pacemaker Evaluation    Result Date: 05/17/2020  Primus Bravo, RN     05/17/2020  1:12 PM Patient is only approved for off-label MRI by the signing EP Attending.  Patient has a non-conditional device system do to the capped and abandoned RV pacing lead. Patient is not pacing dependent.  GENERATOR INFORMATION Manufacturer & Model # [employer] VIGILANT EL ICD D233/ [id number] LEAD INFORMATION Abandoned/Epicardial Lead(s)  Yes Lead Notes viable [id number] capped 4Feb2019  CXR verified 05/06/2020  Manufacturer &  Model #       Prescreening Requirements *The patient has no implanted lead extenders, lead adaptors, or abandoned leads *The patient has no broken leads or leads with intermittent electrical contact, as confirmed by lead impedance history >200 ohms and <1500 ohms *Implanted for more than 6 weeks-unless EP Attending approval *Device is operating within the projected service life-Cannot be done if EOS, ERI, near ERI without Ep Attending approval *For patients whose device will be programmed to an asynchronous pacing mode when the MRI SureScan mode is programmed to On, no diaphragmatic stimulation is present when the paced leads have a pacing output of 5.0 V and a pulse width of 1.0 ms. *Pacing capture thresholds ? 2.0 V at 0.4 ms-if dependent (pacing left on)   MR CONDITIONAL STATUS AND MANAGEMENT System has been implanted more than 6 weeks?  Yes Implant date: 05/04/2017 MR NON-CONDITIONAL SYSTEM Risks and Potential Complications discussed with patient and patient consents to procedure The following conditions have been met: No fractured, epicardial, or abandoned leads MR is the best test for condition Patient is not pacemaker Dependent Device will be programmed under the direction of the Ep Attending by the Ep Device RN

## 2020-05-18 NOTE — Unmapped (Signed)
[facility] Nephrology Hemodialysis Procedure Note     05/18/2020    Shawn Route Williamsen was seen and examined on hemodialysis    CHIEF COMPLAINT: End Stage Renal Disease    INTERVAL HISTORY:   No complaints; awake today    DIALYSIS TREATMENT DATA:  Estimated Dry Weight (kg): 155.5 kg (342 lb 13 oz)  Patient Goal Weight (kg): 3.1 kg (6 lb 13.4 oz)  Dialyzer: F-180 (98 mLs)  Dialysis Bath  Bath: 2 K+ / 2.5 Ca+  Dialysate Na (mEq/L): 137 mEq/L  Dialysate HCO3 (mEq/L): 35 mEq/L  Dialysate Total Buffer HCO3 (mEq/L): 35 mEq/L  Blood Flow Rate (mL/min): 375 mL/min  Dialysis Flow (mL/min): 800 mL/min    PHYSICAL EXAM:  Vitals:  Temp:  [36.3 ??C (97.3 ??F)-36.7 ??C (98 ??F)] 36.7 ??C (98 ??F)  Heart Rate:  [69-71] 70  BP: (120-149)/(67-90) 145/80  MAP (mmHg):  [106-107] 107  Weights:  Pre-Treatment Weight (kg): 158.6 kg (349 lb 10.4 oz)    General: in no acute distress, currently dialyzing in a chair  Pulmonary: clear to auscultation  Cardiovascular: regular rate and rhythm  Extremities: 1+  edema  Access: Right IJ tunneled catheter     LAB DATA:  Lab Results   Component Value Date    NA 131 (L) 05/18/2020    K 6.2 (HH) 05/18/2020    CL 99 05/18/2020    CO2 22.0 05/18/2020    BUN 49 (H) 05/18/2020    CREATININE 8.57 (H) 05/18/2020    CALCIUM 8.8 05/18/2020    MG 1.8 05/06/2020    PHOS 2.8 05/02/2020    ALBUMIN 3.3 (L) 05/18/2020      Lab Results   Component Value Date    HCT 38.8 (L) 05/18/2020    WBC 8.2 05/18/2020        ASSESSMENT/PLAN:  End Stage Renal Disease on Intermittent Hemodialysis:  UF goal: 3.2 L as tolerated  Adjust medications for a GFR <10  Avoid nephrotoxic agents     Bone Mineral Metabolism:  Lab Results   Component Value Date    CALCIUM 8.8 05/18/2020    CALCIUM 8.4 (L) 05/17/2020    Lab Results   Component Value Date    ALBUMIN 3.3 (L) 05/18/2020    ALBUMIN 2.9 (L) 05/17/2020      Lab Results   Component Value Date    PHOS 2.8 05/02/2020    PHOS 4.9 02/26/2020    Lab Results   Component Value Date    PTH 403.4 (H) 07/25/2019    PTH 395.4 (H) 04/14/2019      Labs appropriate, no changes.    Anemia:   Lab Results   Component Value Date    HGB 12.1 (L) 05/18/2020    HGB 12.3 (L) 06/13/20    HGB 11.6 (L) 05/09/2020    Iron Saturation (%)   Date Value Ref Range Status   07/25/2019 11 (L) 20 - 50 % Final      Lab Results   Component Value Date    FERRITIN 150.0 07/25/2019       Anemia labs appropriate, no changes. not on ESA    Maliki Gignac Deirdre Pippins, MD  [facility] Division of Nephrology & Hypertension

## 2020-05-18 NOTE — Unmapped (Signed)
[facility] ESRD Consult Note     Requesting provider: Janelle Floor, MD  Service requesting consult: [facility]   Reason for consult: ESRD, provision of dialysis  Indication for acute dialysis?: No     Outpatient dialysis unit: [place]  Outpatient dialysis schedule: [name]    Assessment/Recommendations: Jeremy Oliver is a 50 y.o. male with a past medical history notable for ESRD on HD admitted with dysartria and left sided UE and LE weakness, HFrEF (20-25%, AICD placed), seizure like episodes, T2DM, CAD s/p PCI. Nephrology consulted for ESRD management.      # ESRD: 4hrs, 2K, 2.5Ca, Na 137, Bicarbonate 35, 36.5C, Dialyzer: 180, Receiving heparin 8000 unit bolus   - HD for ESRD on [name], last on 2/15, will give the undergo next HD on 2/18 as per schedule     # Volume/ hypertension: EDW 155.5kg. Will attempt to achieve dry weight if tolerated.    # Anemia of Chronic Kidney Disease: Hemoglobin 12.3. Currently receiving Micera 50 mcg every 2 weeks (last was Feb 3rd). Above goal so will hold EPO    # Secondary Hyperparathyroidism/Hyperphosphatemia: 8.4 Ca,  receiving calcitriol 1 mcg each treatment, renvela 2400mg  oral TID    # Vascular access: Right IJ tunneled catheter      # Hepatitis status: Hepatitis B surface antigen negative on 03/16/20, HBsAb nonreactive on 08/20/19    # Hospital problem: Left-sided weakness and dysarthria, chest and back pain episodes, seizure like episodes, ESRD (on HD).     # Additional recommendations:  - Avoid nephrotoxic drugs; renally dose meds   - Unless absolutely necessary, no MRIs with gadolinium.   - Implement save arm precautions.  Prefer needle sticks in the dorsum of the hands or wrists.  No blood pressure measurements in arm.  - If blood transfusion is requested during hemodialysis sessions, please alert [place] prior to the session.   - If a hemodialysis catheter line culture is requested, please alert [place] as only hemodialysis nurses are able to collect those specimens.     Recommendations were discussed with the primary team.    **Please contact [facility] PRIOR to hospital discharge so that the nephrology team can arrange appropriate dialysis-related follow-up.**      History of Present Illness: Jeremy Oliver is a 50 y.o. male with a past medical history of ESRD who presents to [facility] with  dysartria and left sided UE and LE weakness, HFrEF (20-25%, AICD placed), seizure like episodes, T2DM, CAD s/p PCI for further management. He reports having mouth drooping and left sided weakness of the limbs for 1 week which he went to OSH. He has had r/r seizure like episodes along with chest pain and back pain. He has been unable to get an MRI because prior free pacemaker leads are incompatible per OSH.   He was back home for 2 days during which his symptoms got worse and was then reffered to [facility]  for further management. He reports having weakness in all four limbs, more on Lt side. Reports chest pain and backpain.   No SOB, abd pain, headache, vision problems.      Medications:   Current Facility-Administered Medications   Medication Dose Route Frequency Provider Last Rate Last Admin   ??? acetaminophen (TYLENOL) tablet 650 mg  650 mg Oral Q8H Lamar Laundry, MD   650 mg at 05/17/20 1041   ??? albumin human 25 % bottle 25 g  25 g Intravenous Each time in dialysis PRN  Justine Null, DO       ??? albuterol (PROVENTIL HFA;VENTOLIN HFA) 90 mcg/actuation inhaler 1 puff  1 puff Inhalation Q4H PRN Lamar Laundry, MD       ??? allopurinoL (ZYLOPRIM) tablet 200 mg  200 mg Oral Daily Lamar Laundry, MD   200 mg at 05/17/20 1042   ??? amiodarone (PACERONE) tablet 200 mg  200 mg Oral BID with meals Lamar Laundry, MD   200 mg at 05/17/20 1748   ??? apixaban (ELIQUIS) tablet 5 mg  5 mg Oral BID Lamar Laundry, MD   5 mg at 05/17/20 1043   ??? atorvastatin (LIPITOR) tablet 80 mg  80 mg Oral Daily Lamar Laundry, MD   80 mg at 05/17/20 1042   ??? bumetanide (BUMEX) tablet 2 mg  2 mg Oral BID Lamar Laundry, MD   2 mg at 05/17/20 1041   ??? calcitrioL (ROCALTROL) capsule 1 mcg  1 mcg Oral Each time in dialysis Justine Null, DO       ??? cholecalciferol (vitamin D3 25 mcg (1,000 units)) tablet 25 mcg  25 mcg Oral Daily Lamar Laundry, MD   25 mcg at 05/17/20 1042   ??? dextrose 50 % in water (D50W) 50 % solution 12.5 g  12.5 g Intravenous Q10 Min PRN Lamar Laundry, MD       ??? epoetin alfa-EPBX (RETACRIT) injection 2,000 Units  2,000 Units Intravenous Each time in dialysis Justine Null, DO       ??? ezetimibe (ZETIA) tablet 10 mg  10 mg Oral Nightly Lamar Laundry, MD   10 mg at 05/25/2020 2308   ??? FLUoxetine (PROzac) capsule 80 mg  80 mg Oral Daily Lamar Laundry, MD   80 mg at 05/17/20 1041   ??? gabapentin (NEURONTIN) capsule 100 mg  100 mg Oral BID Lamar Laundry, MD   100 mg at 05/17/20 1041   ??? gentamicin-sodium citrate lock solution in NS  2 mL hemodialysis port injection Each time in dialysis PRN Justine Null, DO       ??? gentamicin-sodium citrate lock solution in NS  2 mL hemodialysis port injection Each time in dialysis PRN Justine Null, DO       ??? heparin (porcine) 1000 unit/mL injection 8,000 Units  8,000 Units Intravenous Each time in dialysis Justine Null, DO       ??? insulin glargine (LANTUS) injection 20 Units  20 Units Subcutaneous Nightly Lamar Laundry, MD   20 Units at 05/25/2020 2307   ??? insulin lispro (HumaLOG) injection 1-20 Units  1-20 Units Subcutaneous ACHS Lamar Laundry, MD   2 Units at 05/17/20 1746   ??? insulin lispro (HumaLOG) injection 5 Units  5 Units Subcutaneous TID [facility] Lamar Laundry, MD   5 Units at 05/17/20 1747   ??? isosorbide mononitrate (IMDUR) 24 hr tablet 60 mg  60 mg Oral Tu,Th,Sa,Su Lamar Laundry, MD   60 mg at 05/17/20 1747   ??? melatonin tablet 3 mg  3 mg Oral Nightly PRN Lamar Laundry, MD   3 mg at 05/04/2020 2308   ??? methocarbamoL (ROBAXIN) tablet 500 mg  500 mg Oral Q8H PRN Lamar Laundry, MD   500 mg at 05/10/2020 2239   ??? metoprolol succinate (TOPROL-XL) 24 hr tablet 100 mg  100 mg Oral Daily Lamar Laundry, MD   100 mg at 05/17/20 1041   ??? mirtazapine (  REMERON) tablet 7.5 mg  7.5 mg Oral Nightly Lamar Laundry, MD   7.5 mg at 05/19/2020 2308   ??? nicotine (NICODERM CQ) 14 mg/24 hr patch 1 patch  1 patch Transdermal Daily Janelle Floor, MD   1 patch at 05/17/20 1746   ??? nicotine polacrilex (NICORETTE) gum 4 mg  4 mg Buccal Q1H PRN Janelle Floor, MD       ??? nitroglycerin (NITROSTAT) SL tablet 0.4 mg  0.4 mg Sublingual Q5 Min PRN Janelle Floor, MD   0.4 mg at 05/17/20 1028   ??? ondansetron (ZOFRAN-ODT) disintegrating tablet 8 mg  8 mg Oral Q8H PRN Lamar Laundry, MD        Or   ??? ondansetron [facility]) injection 4 mg  4 mg Intravenous Q8H PRN Lamar Laundry, MD       ??? oxyCODONE (ROXICODONE) immediate release tablet 5 mg  5 mg Oral Q6H PRN Lamar Laundry, MD   5 mg at 05/17/20 1029   ??? pantoprazole (PROTONIX) EC tablet 40 mg  40 mg Oral Daily Lamar Laundry, MD   40 mg at 05/17/20 1042   ??? polyethylene glycol (MIRALAX) packet 17 g  17 g Oral Daily PRN Lamar Laundry, MD       ??? predniSONE (DELTASONE) tablet 60 mg  60 mg Oral Daily Janelle Floor, MD   60 mg at 05/17/20 1747   ??? ranolazine (RANEXA) 12 hr tablet 500 mg  500 mg Oral BID Lamar Laundry, MD   500 mg at 05/17/20 1041   ??? sevelamer (RENVELA) tablet 2,400 mg  2,400 mg Oral 3xd Meals Lamar Laundry, MD   2,400 mg at 05/17/20 1747   ??? ticagrelor (BRILINTA) tablet 90 mg  90 mg Oral BID Lamar Laundry, MD   90 mg at 05/17/20 1043   ??? valACYclovir (VALTREX) tablet 500 mg  500 mg Oral Daily Janelle Floor, MD   500 mg at 05/17/20 1748        ALLERGIES  Losartan and Morphine    MEDICAL HISTORY  Past Medical History:   Diagnosis Date   ??? Angina at rest (CMS-[name])    ??? Angina pectoris (CMS-[name]) 08/12/2019    Added automatically from request for surgery [id number]   ??? Arthritis    ??? Asthma    ??? Atrial fibrillation and flutter (CMS-[name]) 06/08/14   ??? BMI 40.0-44.9, adult (CMS-[name]) 10/29/2015   ??? Chronic anticoagulation 02/17/2018   ??? Chronic pain syndrome 04/11/2014    ~Use daily lyrica as recommended ~Tramadol as needed for pain ~Fluoxetine daily as recommended ~Daily exercise ~Eat a healthy diet ~Good control of your blood sugars can help    ??? Chronic systolic CHF (congestive heart failure) (CMS-[name]) 05/02/2020   ??? Coronary artery disease    ??? COVID-19 04/26/2020   ??? Depression    ??? Diabetes mellitus (CMS-[name])    ??? Diabetes mellitus with coincident hypertension (CMS-[name]) 05/18/2009    Take medications are prescribed Meet with dietitian for weight loss plan The DASH Diet can lower your blood pressure Limit alcohol to less than 2 drinks a day for men and less than 1 drink a day for women    ??? Diabetes mellitus with gastroparesis (CMS-[name]) 10/29/2015   ??? ESRD (end stage renal disease) on dialysis (CMS-[name])    ??? Eye trauma 1998    glass in both eyes and removed   ??? GERD (gastroesophageal reflux disease)    ???  Gout    ??? Homeless    ??? Hyperlipidemia    ??? Hypertension    ??? Illiteracy and low-level literacy    ??? Microscopic hematuria    ??? Migraine    ??? Nephrolithiasis    ??? 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 Non-ST elevation (NSTEMI) myocardial infarction (CMS-[name]) 03/13/2020   ??? Nonalcoholic fatty liver disease    ??? NSTEMI (non-ST elevated myocardial infarction) (CMS-[name]) 01/13/2017   ??? NSTEMI (non-ST elevation myocardial infarction) (CMS-[name]) 01/13/2017   ??? Obesity    ??? Obstructive sleep apnea 2009   ??? Panic attacks    ??? 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 Seizure-like activity (CMS-[name])     ~Monitor for symptoms and report them to your primary care provider if they occur    ??? Systolic CHF, chronic (CMS-[name])     EF 40-45% 2017   ??? TIA (transient ischemic attack)         SOCIAL HISTORY  Social History     Socioeconomic History   ??? Marital status: Married     Spouse name: [name]   ??? Number of children: 3   ??? Years of education: 26   ??? Highest education level: Not on file   Occupational History   ??? Occupation: DISABLED     Comment: Former IT sales professional   Tobacco Use   ??? Smoking status: Never Smoker   ??? Smokeless tobacco: Current User     Types: Chew   ??? Tobacco comment: 05/17/2020-spoke to wife who reports 1 can of dip every two weeks.   Vaping Use   ??? Vaping Use: Never used   Substance and Sexual Activity   ??? Alcohol use: Yes     Alcohol/week: 0.0 standard drinks     Comment: rare use: couple times a year, small amount   ??? Drug use: Never   ??? Sexual activity: Not Currently     Partners: Female   Other Topics Concern   ??? Do you use sunscreen? No   ??? Tanning bed use? No   ??? Are you easily burned? No   ??? Excessive sun exposure? No   ??? Blistering sunburns? No   Social History Narrative    Information updated:  01/08/16. Reviewed last: 12/01/18, 08/15/19        Born in [place]    Raised with both parents    Parents died w/in 6 mos of each other. His parents knew they were going to pass and told him just before.    Half sister and 2 half brothers        Married 31 yrs. Wife [name]    2 other children son and daughter, adopted out    Daughter Bridgette, bf DJ        Education - completed through 11th. 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    Former Presenter, broadcasting  since age 7    Lives with wife, difficulty paying bills 12/30/17     Lives next door to daughter, husband and their grandson        05/19/18: pt moving to new home in next 5 days- after an eviction. Has Medicaid assistance again. Will be w/ wife [name], they babysit their grandson often.    06/14/18: Pt and husband living with daughter while new location to live is determined.    12/01/18: pt living with wife and friends. Friends have two young children. [name] states soon they will have their own place, however doesn't know when. Pt states it will be after the neighbors are evicted, we can move in. Pt will need to start dialysis soon. Pt and wife Not talking with daughter and grandson lately.      Social Determinants of Health     Financial Resource Strain: Medium Risk   ??? Difficulty of Paying Living Expenses: Somewhat hard   Food Insecurity: No Food Insecurity   ??? Worried About Running Out of Food in the Last Year: Never true   ??? Ran Out of Food in the Last Year: Never true   Transportation Needs: No Transportation Needs   ??? Lack of Transportation (Medical): No   ??? Lack of Transportation (Non-Medical): No   Physical Activity: Not on file   Stress: Stress Concern Present   ??? Feeling of Stress : Rather much   Social Connections: Moderately Isolated   ??? Frequency of Communication with Friends and Family: More than three times a week   ??? Frequency of Social Gatherings with Friends and Family: Twice a week   ??? Attends Religious Services: Never   ??? Active Member of Clubs or Organizations: No   ??? Attends Banker Meetings: Never   ??? Marital Status: Married        FAMILY HISTORY  Family History   Problem Relation Age of Onset   ??? Diabetes Mother    ??? Hyperlipidemia Mother    ??? Heart disease Mother    ??? Basal cell carcinoma Mother    ??? Blindness Mother         diabeties   ??? Obesity Mother    ??? Hyperlipidemia Father    ??? Heart disease Father    ??? Lung disease Father    ??? Heart disease Half-Sister    ??? Kidney disease Half-Sister    ??? Gallbladder disease Half-Brother    ??? Obesity Half-Brother    ??? No Known Problems Daughter    ??? No Known Problems Son    ??? Obesity Daughter    ??? Depression Maternal Uncle Died by suicide in 10/08/17 by gunshot   ??? Melanoma Neg Hx    ??? Squamous cell carcinoma Neg Hx    ??? Macular degeneration Neg Hx    ??? Glaucoma Neg Hx         Review of Systems:  A 12 system review of systems was negative except as noted in HPI.    Physical Exam:  Vitals:    05/17/20 1558   BP: 148/89   Pulse: 70   Resp: 20   Temp: 36.3 ??C   SpO2: 97%     No intake/output data recorded.    Intake/Output Summary (Last 24 hours) at 05/17/2020 1751  Last data filed at 05/21/2020 2300  Gross per 24 hour   Intake 240 ml   Output ???   Net 240 ml     General: ill-appearing, no  acute distress  HEENT: anicteric sclera, oropharynx clear without lesions  CV: RRR, no m/r/g, no edema  Lungs: CTAB, normal wob  Abd: soft, non-tender, non-distended  Skin: no visible lesions or rashes  Neuro: somnulent, responding slowly to sternal rub    Test Results  Reviewed  Lab Results   Component Value Date    NA 134 (L) 05/17/2020    K 5.1 (H) 05/17/2020    CL 104 05/17/2020    CO2 23.0 05/17/2020    BUN 35 (H) 05/17/2020    CREATININE 7.34 (H) 05/17/2020    GFR >= 60 01/09/2012    GLU 128 05/17/2020    CALCIUM 8.4 (L) 05/17/2020    ALBUMIN 2.9 (L) 05/17/2020    PHOS 2.8 05/02/2020       I have reviewed relevant outside healthcare records

## 2020-05-18 NOTE — Unmapped (Signed)
OCCUPATIONAL THERAPY  Evaluation (05/18/20 0942)    Patient Name:  Jeremy Oliver       Medical Record Number: [id number]   Date of Birth: 1970-10-12  Sex: Male          OT Treatment Diagnosis:  global weakness, Decreased activity tolerance and balance    Assessment  Problem List: Impaired balance,Decreased endurance,Decreased mobility,Fall Risk,Impaired ADLs,Decreased strength,Decreased range of motion    Assessment: Jeremy Oliver is a 50 y.o. male with PMHx medical history of asthma, CAD SP stents, A. fib on Eliquis and amiodarone, HFrEF, insulin-dependent T2DM, ESRD on hemodialysis who presents to [facility] with Weakness with concern for possible stroke. At baseline, patient was ind. in ADLs,  IADLs, and  functional mobility. Patient is now limited by problem list as mentioned above, impacting ADL/functional mobility performance and safety. Pt far below baseline, requiring min. A for bed mobility, multiple attempts at standing with RW and max. A, mod-max. A for LB ADls. Patient would benefit from skilled OT services 3-4 times per week while at [facility], recommend continued skilled OT services 5 times per week at a low intensity upon discharge. After review of the patient's occupational profile and history, assessment of occupational performance, clinical decision making, and development of POC, the patient presents as a moderate complexity case.    Today's Interventions: OT evaluation, bed mobility, functional transfers, self feeding, lower body dressing, grooming tasks, sitting tolerance and standing tolerance. Patient educated on role of OT, POC, safety, importance of RN/therapy assist with OOB mobility, benefits of early ADL engagement and OOB activity,    Activity Tolerance During Today's Session  Tolerated treatment well,Limited by fatigue    Plan  Planned Frequency of Treatment:  1-2x per day for: 3-4x week       Planned Interventions:  Adaptive equipment,ADL retraining,Balance activities,Bed mobility,Functional cognition,Conservation,Compensatory tech. training,Environmental support,Endurance activities,Education - Family / caregiver,Education - Patient,Functional mobility,Home exercise program,Positioning,Safety education,Passive range of motion,Range of Publishing rights manager Occupational Therapy Recommendations:  5x weekly,Low intensity   OT DME Recommendations: Defer to post acute    GOALS:   Patient and Family Goals: Get back to normal    Long Term Goal #1: Pt will score 24/24 on AMPAC in 6 weeks       Short Term:  Pt will complete BSC t/f with LRAD and min. A   Time Frame : 2 weeks  Pt will complete LB dressing with AE PRN and min. A   Time Frame : 2 weeks  Pt will complete 2+ minutes of standing ADLs with LRAD and CGA   Time Frame : 2 weeks                  Prognosis:  Good  Positive Indicators:  PLOF  Barriers to Discharge: Functional strength deficits,Endurance deficits,Inability to safely perform ADLS,Impaired Balance    Subjective  Current Status Pt rec'd sup in bed, left EOB with breafkast, Rn aware and bed alarm set, all needs met, call bell within reach, Rn updated  Prior Functional Status Pt reports being independent with ADLs, cooking, and wife cleans, driving, and using motorized cart at grocery stores. Pt with recent LE weakness, requiring increased assist for ADLs/mobility from wife.  On disability, retired Theatre stage manager.  Falling at home.            Patient / Caregiver reports: I don't know if I can do this ladies    Past Medical History:   Diagnosis Date   ???  Angina at rest (CMS-[name])    ??? Angina pectoris (CMS-[name]) 08/12/2019    Added automatically from request for surgery [id number]   ??? Arthritis    ??? Asthma    ??? Atrial fibrillation and flutter (CMS-[name]) 06/08/14   ??? BMI 40.0-44.9, adult (CMS-[name]) 10/29/2015   ??? Chronic anticoagulation 02/17/2018   ??? Chronic pain syndrome 04/11/2014    ~Use daily lyrica as recommended ~Tramadol as needed for pain ~Fluoxetine daily as recommended ~Daily exercise ~Eat a healthy diet ~Good control of your blood sugars can help    ??? Chronic systolic CHF (congestive heart failure) (CMS-[name]) 05/02/2020   ??? Coronary artery disease    ??? COVID-19 04/26/2020   ??? Depression    ??? Diabetes mellitus (CMS-[name])    ??? Diabetes mellitus with coincident hypertension (CMS-[name]) 05/18/2009    Take medications are prescribed Meet with dietitian for weight loss plan The DASH Diet can lower your blood pressure Limit alcohol to less than 2 drinks a day for men and less than 1 drink a day for women    ??? Diabetes mellitus with gastroparesis (CMS-[name]) 10/29/2015   ??? ESRD (end stage renal disease) on dialysis (CMS-[name])    ??? Eye trauma 1998    glass in both eyes and removed   ??? GERD (gastroesophageal reflux disease)    ??? Gout    ??? Homeless    ??? Hyperlipidemia    ??? Hypertension    ??? Illiteracy and low-level literacy    ??? Microscopic hematuria    ??? Migraine    ??? Nephrolithiasis    ??? 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 Non-ST elevation (NSTEMI) myocardial infarction (CMS-[name]) 03/13/2020   ??? Nonalcoholic fatty liver disease    ??? NSTEMI (non-ST elevated myocardial infarction) (CMS-[name]) 01/13/2017   ??? NSTEMI (non-ST elevation myocardial infarction) (CMS-[name]) 01/13/2017   ??? Obesity    ??? Obstructive sleep apnea 2009   ??? Panic attacks    ??? 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 Seizure-like activity (CMS-[name])     ~Monitor for symptoms and report them to your primary care provider if they occur    ??? Systolic CHF, chronic (CMS-[name])     EF 40-45% 2017   ??? TIA (transient ischemic attack)     Social History     Tobacco Use   ??? Smoking status: Never Smoker   ??? Smokeless tobacco: Current User     Types: Chew   ??? Tobacco comment: 05/17/2020-spoke to wife who reports 1 can of dip every two weeks.   Substance Use Topics   ??? Alcohol use: Yes     Alcohol/week: 0.0 standard drinks     Comment: rare use: couple times a year, small amount      Past Surgical History:   Procedure Laterality Date   ??? CARDIAC CATHETERIZATION  03/08/2007   ??? CORONARY STENT PLACEMENT     ??? CYSTOURETHROSCOPY  12/31/2009     Microscopic hematuria   ??? KNEE SURGERY Right 09/23/2006    arthroscopic knee surgery medial and lateral meniscus tears as well as crystal  disease   ??? PR CATH PLACE/CORON ANGIO, IMG SUPER/INTERP,R&L HRT CATH, L HRT VENTRIC N/A 02/13/2017    Procedure: Left/Right Heart Catheterization;  Surgeon: Marlaine Hind, MD;  Location: [facility] CATH;  Service: Cardiology   ??? PR CATH PLACE/CORON ANGIO, IMG SUPER/INTERP,W LEFT HEART VENTRICULOGRAPHY N/A 03/17/2017    Procedure: Left Heart Catheterization W Intervention;  Surgeon: Marlaine Hind, MD;  Location: [facility] CATH;  Service: Cardiology   ??? PR CATH PLACE/CORON ANGIO, IMG SUPER/INTERP,W LEFT HEART VENTRICULOGRAPHY N/A 05/12/2018    Procedure: CATH LEFT HEART CATHETERIZATION W INTERVENTION;  Surgeon: Dorathy Kinsman, MD;  Location: [facility] CATH;  Service: Cardiology   ??? PR CATH PLACE/CORON ANGIO, IMG SUPER/INTERP,W LEFT HEART VENTRICULOGRAPHY N/A 10/22/2018    Procedure: CATH LEFT HEART CATHETERIZATION W INTERVENTION;  Surgeon: Marlaine Hind, MD;  Location: [facility] CATH;  Service: Cardiology   ??? PR CATH PLACE/CORON ANGIO, IMG SUPER/INTERP,W LEFT HEART VENTRICULOGRAPHY N/A 08/19/2019    Procedure: Left Heart Catheterization W Intervention;  Surgeon: Marlaine Hind, MD;  Location: [facility] CATH;  Service: Cardiology   ??? PR CATH PLACE/CORON ANGIO, IMG SUPER/INTERP,W LEFT HEART VENTRICULOGRAPHY N/A 03/27/2020    Procedure: CATH LEFT HEART CATHETERIZATION W INTERVENTION;  Surgeon: Marlaine Hind, MD;  Location: [facility] CATH;  Service: Cardiology   ??? PR COMPRE EP EVAL ABLTJ 3D MAPG TX SVT N/A 07/19/2015    Catheter ablation of cavotricuspid isthmus for atrial flutter [facility])   ??? PR ECMO/ECLS INITIATION VENO-ARTERIAL N/A 03/19/2017    Procedure: ECMO/ECLS; INITIATION, VENO-ARTERIAL;  Surgeon: Lennie Odor, MD;  Location: MAIN OR [facility];  Service: Cardiac Surgery   ??? PR ECMO/ECLS RMVL PRPH CANNULA OPEN 6 YRS & OLDER Left 03/25/2017    Procedure: ECMO / ECLS PROVIDED BY PHYSICIAN; REMOVAL OF PERIPHERAL (ARTERIAL AND/OR VENOUS) CANNULA(E), OPEN, 6 YEARS AND OLDER;  Surgeon: Alonna Buckler Ikonomidis, MD;  Location: MAIN OR [facility];  Service: Cardiac Surgery   ??? PR INSER HART PACER XVENOUS ATRIAL N/A 05/30/2015    [employer] dual-chamber pacemaker implant (E.Chung)   ??? PR INSERT/PLACE FLOW DIRECT CATH N/A 03/17/2017    Procedure: Insert Leave In [place];  Surgeon: Marlaine Hind, MD;  Location: [facility] CATH;  Service: Cardiology   ??? PR INSJ/RPLCMT PERM DFB W/TRNSVNS LDS 1/DUAL CHMBR N/A 05/04/2017    Procedure: ICD Implant System (Single/Dual);  Surgeon: Eldred Manges, MD;  Location: [facility] CATH;  Service: Cardiology   ??? PR NEGATIVE PRESSURE WOUND THERAPY DME >50 SQ CM Left 03/25/2017    Procedure: Neg Press Wound Tx (Vac Assist) Incl Topicals, Per Session, Tsa Greater Than/= 50 Cm Squared;  Surgeon: Alonna Buckler Ikonomidis, MD;  Location: MAIN OR [facility];  Service: Cardiac Surgery   ??? PR PRQ TRLUML CORONARY STENT W/ANGIO ONE ART/BRNCH N/A 03/12/2018    Procedure: Percutaneous Coronary Intervention;  Surgeon: Marlaine Hind, MD;  Location: [facility] CATH;  Service: Cardiology   ??? PR PRQ TRLUML CORONARY STENT W/ANGIO ONE ART/BRNCH N/A 01/27/2020    Procedure: Percutaneous Coronary Intervention;  Surgeon: Marlaine Hind, MD;  Location: [facility] CATH;  Service: Cardiology   ??? PR REBL VES DIRECT,LOW EXTREM Left 03/19/2017    Procedure: Repr Bld Vessel Direct; Lower Extrem;  Surgeon: Boykin Reaper, MD;  Location: MAIN OR [facility];  Service: Vascular   ??? PR REMV ART CLOT ILIAC-POP,LEG INCIS Left 03/25/2017    Procedure: EMBOLECTOMY OR THROMBECTOMY, WITH OR WITHOUT CATHETER; FEMOROPOPLITEAL, AORTOILIAC ARTERY, BY LEG INCISION;  Surgeon: Alonna Buckler Ikonomidis, MD;  Location: MAIN OR  [facility];  Service: Cardiac Surgery   ??? PR UPPER GI ENDOSCOPY,BIOPSY N/A 02/28/2016    Procedure: UGI ENDOSCOPY; WITH BIOPSY, SINGLE OR MULTIPLE;  Surgeon: Liane Comber, MD;  [facility]: [facility];  Service: Gastroenterology   ??? PR UPPER GI ENDOSCOPY,DIAGNOSIS N/A 05/07/2016    Procedure: UGI ENDO, INCLUDE ESOPHAGUS, STOMACH, & DUODENUM &/OR JEJUNUM; DX W/WO COLLECTION SPECIMN, BY BRUSH OR WASH;  Surgeon: Modena Nunnery, MD;  Location: [facility];  Service: Gastroenterology    Family History   Problem Relation Age of Onset   ??? Diabetes Mother    ??? Hyperlipidemia Mother    ??? Heart disease Mother    ??? Basal cell carcinoma Mother    ??? Blindness Mother         diabeties   ??? Obesity Mother    ??? Hyperlipidemia Father    ??? Heart disease Father    ??? Lung disease Father    ??? Heart disease Half-Sister    ??? Kidney disease Half-Sister    ??? Gallbladder disease Half-Brother    ??? Obesity Half-Brother    ??? No Known Problems Daughter    ??? No Known Problems Son    ??? Obesity Daughter    ??? Depression Maternal Uncle         Died by suicide in 04-Oct-2017 by gunshot   ??? Melanoma Neg Hx    ??? Squamous cell carcinoma Neg Hx    ??? Macular degeneration Neg Hx    ??? Glaucoma Neg Hx         Losartan and Morphine     Objective Findings  Precautions / Restrictions  Aspiration precautions,Falls precautions    Weight Bearing  Non-applicable    Required Braces or Orthoses  Non-applicable    Communication Preference  Verbal    Pain  Pt c/o back pain when sitting upright, no rating, RN aware    Equipment / Environment  Vascular access (PIV, TLC, Port-a-cath, PICC),Patient not wearing mask for full session,Telemetry    Living Situation  Living Environment: House  Lives With: Spouse (wife)  Home Living: One level home,Standard height toilet,Walk-in shower,Stairs to enter with rails  Rail placement (outside): Bilateral rails  Number of Stairs to Enter (outside): 2  Equipment available at home: [place]   Orientation Level:  Oriented x 4   Arousal/Alertness:  Appropriate responses to stimuli   Attention Span:  Appears intact   Memory:  Appears intact   Following Commands:  Follows all commands and directions without difficulty   Safety Judgment:  Good awareness of safety precautions   Awareness of Errors:  Good awareness of safety precautions   Problem Solving:  Able to problem solve independently   Comments:      Vision / Hearing   Vision: No deficits identified     Hearing: No deficit identified         Hand Function:  Right Hand Function: Right hand grip strength, ROM and coordination WNL  Left Hand Function: Left hand grip strength, ROM and coordination WNL  Hand Dominance: Right    Skin Inspection:  Skin Inspection: Intact where visualized    ROM / Strength:  UE ROM/Strength: Left Impaired/Limited,Right Impaired/Limited  RUE Impairment: Limited AROM  LUE Impairment: Limited AROM  UE ROM/ Strength Comment: shoulder flex ~ 80  LE ROM/Strength: Left Impaired/Limited,Right Impaired/Limited  RLE Impairment: Reduced strength,Limited AROM  LLE Impairment: Reduced strength,Limited AROM  LE ROM/ Strength Comment: L  residual foot drop; B weakness, unable to achieve figure 4    Coordination:  Coordination: WFL    Sensation:  RUE Sensation: RUE intact  LUE Sensation: LUE intact    Balance:  sitting: supervision; unable to achieve full upright standing wtih max. A    Functional Mobility  Transfer Assistance Needed: Yes  Transfers - Needs Assistance: Mod assist (sit <> stand attempts x4: RW and min. A x2, able to clear hips but did not achieve upright stance)  Bed Mobility Assistance Needed: Yes  Bed Mobility - Needs Assistance: Min assist (sup > sit: min. A)  Ambulation: NT      ADLs  ADLs: Needs assistance with ADLs  ADLs - Needs Assistance: Feeding,Grooming,Bathing,Toileting,UB dressing,LB dressing  Feeding - Needs Assistance: Set Up Assist,Performed seated  Grooming - Needs Assistance: Set Up Assist,Performed seated  Bathing - Needs Assistance: Mod assist,Performed seated  Toileting - Needs Assistance: Mod assist,Performed at bed level  UB Dressing - Needs Assistance: Min assist,Performed seated  LB Dressing - Needs Assistance: Max assist,Performed seated  IADLs: NT      Vitals / Orthostatics  At Rest: VSS  With Activity: VSS  Orthostatics: asymptomatic      Medical Staff Made Aware: Rn awsare      Occupational Therapy Session Duration  OT Individual [mins]: 34         I attest that I have reviewed the above information.  Signed: Merril Abbe, OT  Filed 05/18/2020

## 2020-05-19 LAB — CBC W/ AUTO DIFF
BASOPHILS ABSOLUTE COUNT: 0 10*9/L (ref 0.0–0.1)
BASOPHILS RELATIVE PERCENT: 0.1 %
EOSINOPHILS ABSOLUTE COUNT: 0 10*9/L (ref 0.0–0.4)
EOSINOPHILS RELATIVE PERCENT: 0.1 %
HEMATOCRIT: 35 % — ABNORMAL LOW (ref 41.0–53.0)
HEMOGLOBIN: 11 g/dL — ABNORMAL LOW (ref 13.5–17.5)
LARGE UNSTAINED CELLS: 1 % (ref 0–4)
LYMPHOCYTES ABSOLUTE COUNT: 0.9 10*9/L — ABNORMAL LOW (ref 1.5–5.0)
LYMPHOCYTES RELATIVE PERCENT: 8 %
MEAN CORPUSCULAR HEMOGLOBIN CONC: 31.5 g/dL (ref 31.0–37.0)
MEAN CORPUSCULAR HEMOGLOBIN: 28.7 pg (ref 26.0–34.0)
MEAN CORPUSCULAR VOLUME: 91 fL (ref 80.0–100.0)
MEAN PLATELET VOLUME: 8.9 fL (ref 7.0–10.0)
MONOCYTES ABSOLUTE COUNT: 0.5 10*9/L (ref 0.2–0.8)
MONOCYTES RELATIVE PERCENT: 4.8 %
NEUTROPHILS ABSOLUTE COUNT: 9.2 10*9/L — ABNORMAL HIGH (ref 2.0–7.5)
NEUTROPHILS RELATIVE PERCENT: 85.6 %
PLATELET COUNT: 270 10*9/L (ref 150–440)
RED BLOOD CELL COUNT: 3.85 10*12/L — ABNORMAL LOW (ref 4.50–5.90)
RED CELL DISTRIBUTION WIDTH: 16.7 % — ABNORMAL HIGH (ref 12.0–15.0)
WBC ADJUSTED: 10.7 10*9/L (ref 4.5–11.0)

## 2020-05-19 LAB — COMPREHENSIVE METABOLIC PANEL
ALBUMIN: 3.1 g/dL — ABNORMAL LOW (ref 3.4–5.0)
ALKALINE PHOSPHATASE: 105 U/L (ref 46–116)
ALT (SGPT): <7 U/L — ABNORMAL LOW (ref 10–49)
ANION GAP: 9 mmol/L (ref 5–14)
AST (SGOT): 11 U/L (ref <=34)
BILIRUBIN TOTAL: 0.2 mg/dL — ABNORMAL LOW (ref 0.3–1.2)
BLOOD UREA NITROGEN: 28 mg/dL — ABNORMAL HIGH (ref 9–23)
BUN / CREAT RATIO: 4
CALCIUM: 8.7 mg/dL (ref 8.7–10.4)
CHLORIDE: 98 mmol/L (ref 98–107)
CO2: 24 mmol/L (ref 20.0–31.0)
CREATININE: 6.29 mg/dL — ABNORMAL HIGH
EGFR CKD-EPI AA MALE: 11 mL/min/{1.73_m2} — ABNORMAL LOW (ref >=60)
EGFR CKD-EPI NON-AA MALE: 9 mL/min/{1.73_m2} — ABNORMAL LOW (ref >=60)
GLUCOSE RANDOM: 191 mg/dL — ABNORMAL HIGH (ref 70–179)
POTASSIUM: 4.3 mmol/L (ref 3.4–4.5)
PROTEIN TOTAL: 6.7 g/dL (ref 5.7–8.2)
SODIUM: 131 mmol/L — ABNORMAL LOW (ref 135–145)

## 2020-05-19 MED ADMIN — insulin lispro (HumaLOG) injection 10 Units: 10 [IU] | SUBCUTANEOUS | @ 17:00:00

## 2020-05-19 MED ADMIN — ezetimibe (ZETIA) tablet 10 mg: 10 mg | ORAL | @ 02:00:00

## 2020-05-19 MED ADMIN — sevelamer (RENVELA) tablet 2,400 mg: 2400 mg | ORAL | @ 02:00:00

## 2020-05-19 MED ADMIN — metoprolol succinate (TOPROL-XL) 24 hr tablet 100 mg: 100 mg | ORAL | @ 14:00:00

## 2020-05-19 MED ADMIN — amiodarone (PACERONE) tablet 200 mg: 200 mg | ORAL | @ 14:00:00

## 2020-05-19 MED ADMIN — oxyCODONE (ROXICODONE) immediate release tablet 5 mg: 5 mg | ORAL | @ 22:00:00 | Stop: 2020-05-30

## 2020-05-19 MED ADMIN — valACYclovir (VALTREX) tablet 500 mg: 500 mg | ORAL | @ 22:00:00 | Stop: 2020-05-24

## 2020-05-19 MED ADMIN — amiodarone (PACERONE) tablet 200 mg: 200 mg | ORAL | @ 02:00:00

## 2020-05-19 MED ADMIN — atorvastatin (LIPITOR) tablet 80 mg: 80 mg | ORAL | @ 14:00:00

## 2020-05-19 MED ADMIN — sevelamer (RENVELA) tablet 2,400 mg: 2400 mg | ORAL | @ 22:00:00

## 2020-05-19 MED ADMIN — polyethylene glycol (MIRALAX) packet 17 g: 17 g | ORAL | @ 23:00:00

## 2020-05-19 MED ADMIN — gabapentin (NEURONTIN) capsule 100 mg: 100 mg | ORAL | @ 14:00:00

## 2020-05-19 MED ADMIN — insulin lispro (HumaLOG) injection 10 Units: 10 [IU] | SUBCUTANEOUS | @ 22:00:00

## 2020-05-19 MED ADMIN — mirtazapine (REMERON) tablet 7.5 mg: 7.5 mg | ORAL | @ 02:00:00

## 2020-05-19 MED ADMIN — prochlorperazine (COMPAZINE) injection 10 mg: 10 mg | INTRAVENOUS | @ 02:00:00 | Stop: 2020-05-18

## 2020-05-19 MED ADMIN — acetaminophen (TYLENOL) tablet 650 mg: 650 mg | ORAL | @ 12:00:00

## 2020-05-19 MED ADMIN — FLUoxetine (PROzac) capsule 80 mg: 80 mg | ORAL | @ 14:00:00

## 2020-05-19 MED ADMIN — methocarbamoL (ROBAXIN) tablet 500 mg: 500 mg | ORAL | @ 02:00:00

## 2020-05-19 MED ADMIN — bumetanide (BUMEX) tablet 2 mg: 2 mg | ORAL | @ 14:00:00

## 2020-05-19 MED ADMIN — ticagrelor (BRILINTA) tablet 90 mg: 90 mg | ORAL | @ 02:00:00

## 2020-05-19 MED ADMIN — valACYclovir (VALTREX) tablet 500 mg: 500 mg | ORAL | @ 02:00:00 | Stop: 2020-05-24

## 2020-05-19 MED ADMIN — sevelamer (RENVELA) tablet 2,400 mg: 2400 mg | ORAL | @ 14:00:00

## 2020-05-19 MED ADMIN — insulin glargine (LANTUS) injection 30 Units: 30 [IU] | SUBCUTANEOUS | @ 02:00:00

## 2020-05-19 MED ADMIN — oxyCODONE (ROXICODONE) immediate release tablet 5 mg: 5 mg | ORAL | @ 12:00:00 | Stop: 2020-05-30

## 2020-05-19 MED ADMIN — insulin lispro (HumaLOG) injection 10 Units: 10 [IU] | SUBCUTANEOUS | @ 14:00:00

## 2020-05-19 MED ADMIN — ticagrelor (BRILINTA) tablet 90 mg: 90 mg | ORAL | @ 14:00:00

## 2020-05-19 MED ADMIN — cholecalciferol (vitamin D3 25 mcg (1,000 units)) tablet 25 mcg: 25 ug | ORAL | @ 14:00:00

## 2020-05-19 MED ADMIN — allopurinoL (ZYLOPRIM) tablet 200 mg: 200 mg | ORAL | @ 14:00:00

## 2020-05-19 MED ADMIN — insulin lispro (HumaLOG) injection 1-20 Units: 1-20 [IU] | SUBCUTANEOUS | @ 14:00:00

## 2020-05-19 MED ADMIN — bumetanide (BUMEX) tablet 2 mg: 2 mg | ORAL | @ 02:00:00

## 2020-05-19 MED ADMIN — apixaban (ELIQUIS) tablet 5 mg: 5 mg | ORAL | @ 14:00:00

## 2020-05-19 MED ADMIN — ranolazine (RANEXA) 12 hr tablet 500 mg: 500 mg | ORAL | @ 14:00:00

## 2020-05-19 MED ADMIN — acetaminophen (TYLENOL) tablet 650 mg: 650 mg | ORAL | @ 22:00:00

## 2020-05-19 MED ADMIN — polyethylene glycol (MIRALAX) packet 17 g: 17 g | ORAL | @ 02:00:00

## 2020-05-19 MED ADMIN — pantoprazole (PROTONIX) EC tablet 40 mg: 40 mg | ORAL | @ 14:00:00

## 2020-05-19 MED ADMIN — insulin lispro (HumaLOG) injection 1-20 Units: 1-20 [IU] | SUBCUTANEOUS | @ 17:00:00

## 2020-05-19 MED ADMIN — oxyCODONE (ROXICODONE) immediate release tablet 5 mg: 5 mg | ORAL | @ 02:00:00 | Stop: 2020-05-30

## 2020-05-19 MED ADMIN — ranolazine (RANEXA) 12 hr tablet 500 mg: 500 mg | ORAL | @ 02:00:00

## 2020-05-19 MED ADMIN — gabapentin (NEURONTIN) capsule 100 mg: 100 mg | ORAL | @ 02:00:00

## 2020-05-19 MED ADMIN — insulin lispro (HumaLOG) injection 1-20 Units: 1-20 [IU] | SUBCUTANEOUS | @ 22:00:00

## 2020-05-19 MED ADMIN — apixaban (ELIQUIS) tablet 5 mg: 5 mg | ORAL | @ 02:00:00

## 2020-05-19 MED ADMIN — nicotine (NICODERM CQ) 14 mg/24 hr patch 1 patch: 1 | TRANSDERMAL | @ 14:00:00

## 2020-05-19 MED ADMIN — predniSONE (DELTASONE) tablet 60 mg: 60 mg | ORAL | @ 14:00:00 | Stop: 2020-05-24

## 2020-05-19 MED ADMIN — isosorbide mononitrate (IMDUR) 24 hr tablet 60 mg: 60 mg | ORAL | @ 22:00:00

## 2020-05-19 MED ADMIN — melatonin tablet 3 mg: 3 mg | ORAL | @ 02:00:00

## 2020-05-19 MED ADMIN — amiodarone (PACERONE) tablet 200 mg: 200 mg | ORAL | @ 22:00:00

## 2020-05-19 MED ADMIN — sevelamer (RENVELA) tablet 2,400 mg: 2400 mg | ORAL | @ 17:00:00

## 2020-05-19 NOTE — Unmapped (Signed)
HEMODIALYSIS NURSE PROCEDURE NOTE       Treatment Number:  1 Room / Station:  4    Procedure Date:  05/18/20 Device Name/Number: Beverely Pace    Total Dialysis Treatment Time:  241 Min.    CONSENT:    Written consent was obtained prior to the procedure and is detailed in the medical record.  Prior to the start of the procedure, a time out was taken and the identity of the patient was confirmed via name, medical record number and date of birth.     WEIGHT:  Hemodialysis Pre-Treatment Weights     Date/Time Pre-Treatment Weight (kg) Estimated Dry Weight (kg) Patient Goal Weight (kg) Total Goal Weight (kg)    05/18/20 1328 158.6 kg (349 lb 10.4 oz) 155.5 kg (342 lb 13 oz) 3.1 kg (6 lb 13.4 oz) 3.65 kg (8 lb 0.8 oz)         Hemodialysis Post Treatment Weights     Date/Time Post-Treatment Weight (kg) Treatment Weight Change (kg)    05/18/20 1815 155.2 kg (342 lb 2.5 oz) -3.41 kg        Active Dialysis Orders (168h ago, onward)     Start     Ordered    05/17/20 1137  Hemodialysis inpatient  Every Mon, Wed, Fri      Question Answer Comment   K+ 2 meq/L    Ca++ 2.5 meq/L    Bicarb 35 meq/L    Na+ 137 meq/L    Na+ Modeling none    Dialyzer [id number]    Dialysate Temperature (C) 36.5    BFR-As tolerated to a maximum of: Other (please specify) 375 ml/min   DFR 800 mL/min    Duration of treatment 4 Hr    Dry weight (kg) 155.5    Challenge dry weight (kg) no    Fluid removal (L) to EDW    Tubing Adult = 142 ml    Access Site Dialysis Catheter    Access Site Location Right    Keep SBP >: 90        05/17/20 1136              ASSESSMENT:  General appearance: alert, cooperative and no distress  Neurologic: Grossly normal  Lungs: diminished breath sounds bilaterally  Heart: paced rhythm, on tele monitor   Abdomen: normal findings: bowel sounds normal    ACCESS SITE:       Hemodialysis Catheter Right Subclavian 2.2 mL 2.3 mL (Active)   Site Assessment Clean;Intact;Dry 05/18/20 1830   Proximal Lumen Status / Patency Capped 05/18/20 1830 Proximal Lumen Intervention Deaccessed 05/18/20 1830   Medial Lumen Status / Patency Capped 05/18/20 1830   Medial Lumen Intervention Deaccessed 05/18/20 1830   Dressing Intervention New dressing 05/18/20 1830   Dressing Status      Clean;Dry;Intact/not removed 05/18/20 1830   Site Condition No complications 05/18/20 1830   Dressing Type CHG gel;Occlusive;Transparent 05/18/20 1830   Dressing Change Due 05/25/20 05/18/20 1830   Line Necessity Reviewed? Y 05/18/20 1830   Line Necessity Indications Yes - Hemodialysis 05/18/20 1830   Line Necessity Reviewed With nephrology 05/18/20 1400           Catheter fill volumes:    Arterial: 2.2 mL Venous: 2.3 mL   Catheter filled with 1 mg Gentamicin Citrate post procedure.     Patient Lines/Drains/Airways Status     Active Peripheral & Central Intravenous Access     Name Placement date Placement time Site Days  Peripheral IV 05/19/2020 Right Antecubital 05-19-2020  1231  Antecubital  2               LAB RESULTS:  Lab Results   Component Value Date    NA 131 (L) 05/18/2020    K 6.2 (HH) 05/18/2020    CL 99 05/18/2020    CO2 22.0 05/18/2020    BUN 49 (H) 05/18/2020    CREATININE 8.57 (H) 05/18/2020    GLU 203 (H) 05/18/2020    GLUF 223 (H) 06/09/2014    CALCIUM 8.8 05/18/2020    CAION 4.31 (L) 02/25/2020    PHOS 2.8 05/02/2020    MG 1.8 05/06/2020    PTH 403.4 (H) 07/25/2019    IRON 32 (L) 07/25/2019    LABIRON 11 (L) 07/25/2019    TRANSFERRIN 223.8 07/25/2019    FERRITIN 150.0 07/25/2019    TIBC 282.0 07/25/2019     Lab Results   Component Value Date    WBC 8.2 05/18/2020    HGB 12.1 (L) 05/18/2020    HCT 38.8 (L) 05/18/2020    PLT 297 05/18/2020    PHART 7.31 (L) 04/07/2017    PO2ART 124.0 (H) 04/07/2017    PCO2ART 44.3 04/07/2017    HCO3ART 22 04/07/2017    BEART -3.4 (L) 04/07/2017    O2SATART 98.9 04/07/2017    APTT 53.3 (H) 02/26/2020        VITAL SIGNS:     Hemodynamics     Date/Time Pulse BP MAP (mmHg) Patient Position    05/18/20 1815 70 133/76 ??? Lying    05/18/20 1800 70 131/83 ??? Lying    05/18/20 1730 72 147/79 ??? Lying    05/18/20 1700 70 137/80 ??? Lying    05/18/20 1630 70 107/63 ??? Lying    05/18/20 1600 69 124/57 ??? Lying    05/18/20 1530 72 126/69 ??? Lying    05/18/20 1500 70 145/80 ??? Lying          Oxygen Therapy     Date/Time Resp SpO2 O2 Device FiO2 (%) O2 Flow Rate (L/min)    05/18/20 1815 18 ??? ??? ??? --    05/18/20 1800 16 ??? ??? ??? --    05/18/20 1730 17 ??? ??? ??? --    05/18/20 1700 16 ??? ??? ??? --    05/18/20 1630 18 ??? ??? ??? --    05/18/20 1600 18 ??? ??? ??? --    05/18/20 1530 16 ??? ??? ??? --    05/18/20 1500 18 ??? ??? ??? --          Pre-Hemodialysis Assessment     Date/Time Therapy Number Dialyzer Hemodialysis Line Type All Machine Alarms Passed    05/18/20 1328 1 F-180 (98 mLs) Adult (142 m/s) Yes    Date/Time Air Detector Saline Line Double Clampled Hemo-Safe Applied Dialysis Flow (mL/min)    05/18/20 1328 Engaged ??? ??? 800 mL/min    Date/Time Verify Priming Solution Priming Volume Hemodialysis Independent pH Hemodialysis Machine Conductivity (mS/cm)    05/18/20 1328 0.9% NS 250 mL ??? 13.5 mS/cm    Date/Time Hemodialysis Independent Conductivity (mS/cm) Bicarb Conductivity Residual Bleach Negative Total Chlorine    05/18/20 1328 13.5 mS/cm -- Yes 0        Pre-Hemodialysis Treatment Comments     Date/Time Pre-Hemodialysis Comments    05/18/20 1328 VSS, alert        Hemodialysis Treatment     Date/Time Blood Flow Rate (mL/min) Arterial  Pressure (mmHg) Venous Pressure (mmHg) Transmembrane Pressure (mmHg)    05/18/20 1815 375 mL/min -195 mmHg 146 mmHg 2 mmHg    05/18/20 1800 375 mL/min -195 mmHg 137 mmHg 35 mmHg    05/18/20 1730 375 mL/min -192 mmHg 140 mmHg 34 mmHg    05/18/20 1700 375 mL/min -187 mmHg 138 mmHg 33 mmHg    05/18/20 1630 375 mL/min -183 mmHg 135 mmHg 33 mmHg    05/18/20 1600 375 mL/min -186 mmHg 134 mmHg 35 mmHg    05/18/20 1530 375 mL/min -176 mmHg 135 mmHg 36 mmHg    05/18/20 1500 375 mL/min -172 mmHg 133 mmHg 38 mmHg    05/18/20 1430 375 mL/min -166 mmHg 132 mmHg 33 mmHg    05/18/20 1414 375 mL/min -137 mmHg 54 mmHg 22 mmHg    Date/Time Ultrafiltration Rate (mL/hr) Ultrafiltrate Removed (mL) Dialysate Flow Rate (mL/min) KECN (Kecn)    05/18/20 1815 860 mL/hr 3501 mL 0 ml/min ???    05/18/20 1800 860 mL/hr 3290 mL 800 ml/min ???    05/18/20 1730 860 mL/hr 2874 mL 800 ml/min ???    05/18/20 1700 860 mL/hr 2438 mL 800 ml/min ???    05/18/20 1630 860 mL/hr 2017 mL 800 ml/min ???    05/18/20 1600 860 mL/hr 1596 mL 800 ml/min ???    05/18/20 1530 860 mL/hr 1171 mL 800 ml/min ???    05/18/20 1500 860 mL/hr 750 mL 800 ml/min ???    05/18/20 1430 860 mL/hr 314 mL 800 ml/min ???    05/18/20 1414 0 mL/hr 100 mL 800 ml/min ???        Hemodialysis Treatment Comments     Date/Time Intra-Hemodialysis Comments    05/18/20 1815 tx complete    05/18/20 1800 pt conversant    05/18/20 1730 pt sleeping    05/18/20 1700 WATCHING TV    05/18/20 1630 Cards MD rounding on pt    05/18/20 1600 watching tv, no c/o    05/18/20 1530 pt sleeping    05/18/20 1500 Dr  Tammi Klippel rounding  Dr Tammi Klippel rounding    05/18/20 1430 pt sleeping    05/18/20 1414 tx started        Post Treatment     Date/Time Rinseback Volume (mL) On Line Clearance: spKt/V Total Liters Processed (L/min) Dialyzer Clearance    05/18/20 1815 300 mL 0.71 spKt/V 82.1 L/min Lightly streaked        Post Hemodialysis Treatment Comments     Date/Time Post-Hemodialysis Comments    05/18/20 1815 VSS, pt has no c/o        Hemodialysis I/O     Date/Time Total Hemodialysis Replacement Volume (mL) Total Ultrafiltrate Output (mL)    05/18/20 1815 ??? 3516 mL          1209-1209-01 - Medicaitons Given During Treatment  (last 4 hrs)         Harika Laidlaw Wayland Denis, RN       Medication Name Action Time Action Route Rate Dose User     calcitrioL (ROCALTROL) capsule 1 mcg 05/18/20 1724 Given Oral  1 mcg Richardson Chiquito, RN     gentamicin-sodium citrate lock solution in NS 05/18/20 1815 Given hemodialysis port injection  2 mL Richardson Chiquito, RN     gentamicin-sodium citrate lock solution in NS 05/18/20 1815 Given hemodialysis port injection  2 mL Richardson Chiquito, RN          ROMELIA Olena Leatherwood, RN  Medication Name Action Time Action Route Rate Dose User     acetaminophen (TYLENOL) tablet 650 mg 05/18/20 1612 Not Given Oral  650 mg Lelon Perla, RN     insulin lispro (HumaLOG) injection 1-20 Units 05/18/20 1611 Not Given Subcutaneous  1 Units Romelia Olena Leatherwood, RN                  Patient tolerated treatment in a  Bed.

## 2020-05-19 NOTE — Unmapped (Signed)
[facility] Medicine Progress Note    Assessment/Plan:    Principal Problem:    Weakness  Active Problems:    Chest pain    Tobacco use disorder    Transient alteration of awareness    Dysarthria    Paresthesia of left upper extremity    Thoracic back pain  Resolved Problems:    * No resolved hospital problems. *      Jeremy Oliver is a 50 y.o. male with PMHx as noted below who presents to [facility] with Weakness.    Left sided weakness - Dysarthria: Concern for possible stroke, though symptoms have been ongoing >1 week. 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 With his back pain, also concern for possible spinal pathology that could explain peripheral symptoms. During recent admission at an OSH he was unable to get an MRI because prior free pacemaker leads are incompatible per OSH. Will note that his neuro exam was somewhat inconsistent. Question whether this could be some type of post-COVID complication given he had mild COVID in January.  - Neurology consulted, appreciate their assistance. Facial palsy felt to be consistent with Bell's Palsy as below. Left lower leg weakness and foot drop are chronic. Unclear etiology to left upper extremity weakness but concern that at least a component of this is volitional   - Continue to trend neuro exam  - PT/OT/speech evaluation: Previously not interested in rehab but now open to this   - Speech: Rec aspiration precautions, otherwise regular consistent of food and liquids   - PT/OT: Recommending 5x low  - Cardiology device team consulted to evaluate if pacemaker is MRI compatible, appreciate their assistance  - Holding on further imaging after discussion with neurology consult team. Bialy need to consider spinal imaging vs uploading OSH images for over-read if continued back pain and weakness    Facial palsy consistent with Bell's palsy: Likely associated with recent COVID-19 infection, although could be due to a number of etiologies.  - Appreciate neurology consult team assistance in evaluation  - Continue prednisone 60mg  daily x7 days  - Continue valacyclovir 500mg  daily x7 days (on HD days should get after dialysis). End date: 05/24/20    Back pain: Certainly has musculoskeletal components; however initial CT at outside hospital reported possible T1 mass. A repeat thoracic CT at OSH then was read as normal and initial findings thought to be possible artifact. Both mention artifact present due to body habitus. He has no bowel/bladder incontinence to suggest acute cord pathology and his unilateral findings do argue more for a central process. Additionally reports having an event when the pain started while picking up something heavy with a twisting motion, after which the pain developed.   - Would be helpful to have radiology overread images - will continue to attempt to push to PACS  - If unable to obtain imaging, [place] need spine/neurosurgery consult or repeat imaging  - Have consulted the cardiology device team to see if we are able to perform MRI here (at OSH reportedly pacing wires were MRI incompatible, will note he was able to have an MRI in 2016)  - Symptomatic pain control    Chest pain: Initially reported to ED, though improved on my exam. Given this and associated back pain and neuro findings, there was concern for dissection.  - CTA to evaluate for aortic dissection was negative  -  Troponins have been negative with no acute changes on ECG    Left ankle pain: After fall. X-rays on admission notable for diffuse soft tissue swelling but no acute osseous abnormalities.  - Treat conservatively     CAD s/p PCI (last 02/2020): Significant disease, most recent cath 02/2020 with severe 2v disease. 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  Will note he does still make urine and did receive contrasted CT after discussion of risk/benefits in ED  - Nephrology consulted, appreciate their assistance  - Continue home sevalamer    Prior seizure like episodes without EEG correlate: During prior hospitalizations has had unresponsive episodes with normal vital signs with full workup including cardiac workup, central head imaging, neurology consult and EEG. 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  -  Monitor closely    Anxiety:  - Continue home fluoxetine  - Consider palliative care consult for significant issues coping with medical diagnoses (has been referred outpatient)    Tobacco Abuse: Uses chewing tobacco, around 0.5 cans weekly per family.   - Tobacco cessation counseling evaluated patient, appreciate their assistance  - Start nicotine patch 14mg    - Start nicotine gum 4mg  every hour PRN      Code Status: Full Code, wife is decision maker if he is unable, currently has capacity    F/E/N:  - Renal diet    Proph: On apixaban for anticoagulation    Vaccinated with J&J vaccine x1, had covid in January and received monoclonal antibody infusion    Dispo: PT/OT and speech consulted to determine disposition, [place] need SNF  ___________________________________________________________________    Subjective / Events:   Yesterday patient underwent planned dialysis. After completion had an episode of chest/back pain, decreased responsiveness. Rapid response called and patient evaluated for cardiac etiology, all of which were negative. Overnight had an episode of emesis, was started on promethazine.       Physical Exam:  Temp:  [36.3 ??C (97.3 ??F)-36.7 ??C (98 ??F)] 36.3 ??C (97.3 ??F)  Heart Rate:  [69-72] 70  Resp:  [16-20] 19  BP: (107-161)/(57-87) 116/65  SpO2:  [95 %-98 %] 98 %  Body mass index is 44.89 kg/m??.    GEN: Lying in bed in NAD  HEENT: Clear OP, MMM  CV: Regular rate and rhythm, no m/r/g, 1+ peripheral edema  RESP: CTA BL, no w/r/r. Normal work of breathing  ABD: Soft, NT/ND, normoactive bowel sounds  SKIN: Scattered bruising over bilateral chest, posterior left neck, abdomen  MSK/ext: Warm, well perfused, left ankle slightly more edematous than right, tenderness with range of motion, mild bruising  NEURO: alert, oriented x4, PERRL, EOMI, moderate left facial droop including forehead and left eye weakness, less pronounced than prior. Somewhat weaker left upper extremity compared  to right, though exam is inconsistent  PSYCH: Anxious, intermittently tearful      Test Results:  Lab Results   Component Value Date    WBC 10.7 05/19/2020    WBC 14.1 (H) 05/18/2020    WBC 7.5 06/09/2014    WBC 8.9 06/08/2014    Absolute Neutrophils 9.2 (H) 05/19/2020    Absolute Neutrophils 5.4 06/08/2014    Absolute Lymphocytes 0.9 (L) 05/19/2020    Absolute Lymphocytes 2.5 06/08/2014    Absolute Eosinophils 0.0 05/19/2020    Absolute Eosinophils 0.2 06/08/2014    HGB 11.0 (L) 05/19/2020    Hemoglobin 8.1 (L) 03/25/2017    HCT 35.0 (L) 05/19/2020    HCT 37.9 (L) 06/09/2014    Platelet 270 05/19/2020    Platelet 298 05/18/2020    Platelet 190 06/09/2014    Platelet 275 06/08/2014    Sodium 131 (L) 05/19/2020    Sodium Whole Blood 132 (L) 03/25/2017    Sodium, POCT 134 (L) 07/25/2019    Potassium 4.3 05/19/2020    Potassium, Bld 5.5 (H) 03/25/2017    Potassium, POCT 5.3 (H) 07/25/2019    BUN 28 (H) 05/19/2020    BUN, POCT 56 (H) 07/25/2019 Creatinine, POCT 5.8 (H) 07/25/2019    Creatinine, POCT 6.2 (H) 07/22/2019    Creatinine 6.29 (H) 05/19/2020    Creatinine 4.69 (H) 05/18/2020    EGFR African American >=60 06/09/2014    EGFR Non-African American 59 (L) 06/09/2014    Glucose 191 (H) 05/19/2020    Glucose, POCT 212 (H) 07/25/2019    Magnesium 1.8 05/06/2020    Magnesium 2.0 06/09/2014    Albumin 3.1 (L) 05/19/2020    Albumin 4.3 06/08/2014    Total Bilirubin 0.2 (L) 05/19/2020    Total Bilirubin 0.8 06/08/2014    AST 11 05/19/2020    AST 37 06/08/2014    ALT <7 (L) 05/19/2020    ALT 36 06/08/2014    Alkaline Phosphatase 105 05/19/2020    Alkaline Phosphatase 98 06/08/2014    INR, POC 1.2 02/12/2018    INR 1.50 05/18/2020    Creatine Kinase, Total 45.0 (L) 05/25/2015    Creatine Kinase, Total 40 (L) 09/15/2012    Sed Rate 122 (H) 11/23/2017    Sed Rate 16 (H) 03/17/2016    Sed Rate 34 (H) 09/06/2011    CRP 244.0 (H) 11/23/2017    CRP 249.0 (H) 11/23/2017    CRP 3.2 (H) 09/06/2011       Imaging:  ECG 12 Lead    Result Date: 05/18/2020  ATRIAL-PACED RHYTHM WITH PROLONGED AV CONDUCTION LEFT VENTRICULAR HYPERTROPHY WITH QRS WIDENING AND REPOLARIZATION ABNORMALITY PROLONGED QT ABNORMAL ECG WHEN COMPARED WITH ECG OF 16-May-2020 18:42, NO SIGNIFICANT CHANGE WAS FOUND Confirmed by Pollyann Kennedy ([id number]) on 05/18/2020 10:46:12 PM    ECG 12 Lead    Result Date: 05/18/2020  ATRIAL-PACED RHYTHM WITH PROLONGED AV CONDUCTION LEFT VENTRICULAR HYPERTROPHY WITH REPOLARIZATION ABNORMALITY PROLONGED QT ABNORMAL ECG WHEN COMPARED WITH ECG OF 18-May-2020 12:13, NO SIGNIFICANT CHANGE WAS FOUND    ECG 12 Lead    Result Date: 05/18/2020  ATRIAL-PACED RHYTHM WITH PROLONGED AV CONDUCTION LEFT VENTRICULAR HYPERTROPHY WITH QRS WIDENING ( R in aVL , [employer] product ) ST & T WAVE ABNORMALITY, CONSIDER LATERAL ISCHEMIA ABNORMAL ECG WHEN COMPARED WITH ECG OF 17-May-2020 10:07, NO SIGNIFICANT CHANGE WAS FOUND    ECG 12 Lead    Result Date: 05/17/2020  ATRIAL-PACED RHYTHM WITH PROLONGED AV CONDUCTION LEFT VENTRICULAR HYPERTROPHY WITH QRS  WIDENING AND REPOLARIZATION ABNORMALITY PROLONGED QT ABNORMAL ECG WHEN COMPARED WITH ECG OF 16-May-2020 14:20, NO SIGNIFICANT CHANGE WAS FOUND Confirmed by Pollyann Kennedy [phone]) on 05/17/2020 8:32:34 PM    ECG 12 Lead    Result Date: 05/17/2020  ATRIAL-PACED RHYTHM WITH PROLONGED AV CONDUCTION LVH WITH REPOLARIZATION ABNORMALITY PROLONGED QT ABNORMAL ECG WHEN COMPARED WITH ECG OF 16-May-2020 13:23, NO SIGNIFICANT CHANGE WAS FOUND Confirmed by Pollyann Kennedy ([id number]) on 05/17/2020 8:05:04 PM    ECG 12 Lead    Result Date: 05/17/2020  SINUS RHYTHM  WITH PROLONGED AV CONDUCTION MODERATE VOLTAGE CRITERIA FOR LVH, [place] BE NORMAL VARIANT ( R in aVL , [employer] product ) CANNOT RULE OUT ANTERIOR INFARCT  (CITED ON OR BEFORE 09-May-2020) T WAVE ABNORMALITY, CONSIDER LATERAL ISCHEMIA PROLONGED QT ABNORMAL ECG WHEN COMPARED WITH ECG OF 09-May-2020 09:59, SERIAL CHANGES OF ANTERIOR INFARCT  PRESENT Confirmed by Mariane Baumgarten ([id number]) on 05/17/2020 2:34:05 PM    XR Ankle 2 Views Left    Result Date: 05/17/2020  EXAM: XR ANKLE 2 VIEWS LEFT DATE: 05/17/2020 12:00 AM ACCESSION: [id number] UN DICTATED: 05/17/2020 12:51 AM INTERPRETATION LOCATION: [facility] CLINICAL INDICATION: 50 years old Male with fall, ankle pain and swelling  COMPARISON: None. TECHNIQUE: AP, oblique and lateral views of the left ankle. FINDINGS: No fracture. Ankle is approximated. Talar dome is smooth. Degenerative changes of the midfoot and ankle joint. Diffuse lower extremity edema and swelling.     Diffuse swelling without acute osseous abnormality identified.    CTA Aortic Dissection    Result Date: 05/25/2020  EXAM: CTA Chest, Abdomen, Pelvis for Aortic Dissection DATE: 05/15/2020 4:46 PM ACCESSION: [id number] UN DICTATED: 05/22/2020 4:45 PM INTERPRETATION LOCATION: [facility] CLINICAL INDICATION: 49 years old Male with c/f aortic dissection ; Aortic disease, nontraumatic ; Chest pain or back pain, aortic dissection suspected COMPARISON: None TECHNIQUE: A helical CT of the chest was obtained without IV contrast from the thoracic inlet through the hemidiaphragms. Images were reconstructed in the axial plane. Next, a spiral CTA  of the chest, abdomen and pelvis was obtained with IV contrast from the thoracic inlet through the aortic bifurcation. Images were reconstructed in the axial plane.  Multiplanar reformatted and MIP images are provided for further evaluation of the aorta. FINDINGS: AORTA: Normal caliber aorta. No thoracic aortic intramural hematoma.  No aortic dissection. CHEST: Normal heart size.  No pericardial effusion. No mediastinal lymphadenopathy. Left chest wall ICD. Partially visualized right dual-lumen central venous catheter with the distal tip terminating in the in the suprahepatic IVC and the proximal tip likely terminating within the right atrium. Clear central airways. No consolidation.  No pleural effusion. Bibasilar groundglass opacities likely represent atelectasis. ABDOMEN and PELVIS: HEPATOBILIARY: No focal hepatic lesions. The gallbladder is present and otherwise unremarkable. No biliary dilatation.  SPLEEN: Unremarkable. PANCREAS: Unremarkable. 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 KIDNEYS/URETERS: Mildly atrophic bilateral kidneys with multiple hypoattenuating renal cysts and bilateral perinephric stranding which is likely physiological. GI TRACT: No dilated or thick walled loops of bowel. Normal appendix (4:57 PERITONEUM/RETROPERITONEUM AND MESENTERY: No free air or fluid. LYMPH NODES: No enlarged lymph nodes. VESSELS: The aorta is normal in caliber.  No significant calcified atherosclerotic disease. The portal venous system is patent. The hepatic veins and IVC are unremarkable. BONES AND SOFT TISSUES: Unremarkable.  No bowel obstruction.  No free fluid.  No abdominal or pelvic lymphadenopathy. BONES: Unremarkable. SOFT TISSUES: Skin thickening and subcutaneous fat stranding over  the left lower quadrant abdominal pannus is indeterminate. Recommend clinical correlation (4:208).     -- No aortic dissection. -- Bilateral adrenal myelolipomas, as above.     ICD / Pacemaker Evaluation    Result Date: 05/17/2020  Primus Bravo, RN     05/17/2020  1:12 PM Patient is only approved for off-label MRI by the signing EP Attending.  Patient has a non-conditional device system do to the capped and abandoned RV pacing lead. Patient is not pacing dependent.  GENERATOR INFORMATION Manufacturer & Model # [employer] VIGILANT EL ICD D233/ [phone] LEAD INFORMATION Abandoned/Epicardial Lead(s)  Yes Lead Notes viable [phone] capped 4Feb2019  CXR verified 05/06/2020  Manufacturer & Model #       Prescreening Requirements *The patient has no implanted lead extenders, lead adaptors, or abandoned leads *The patient has no broken leads or leads with intermittent electrical contact, as confirmed by lead impedance history >200 ohms and <1500 ohms *Implanted for more than 6 weeks-unless EP Attending approval *Device is operating within the projected service life-Cannot be done if EOS, ERI, near ERI without Ep Attending approval *For patients whose device will be programmed to an asynchronous pacing mode when the MRI SureScan mode is programmed to On, no diaphragmatic stimulation is present when the paced leads have a pacing output of 5.0 V and a pulse width of 1.0 ms. *Pacing capture thresholds ? 2.0 V at 0.4 ms-if dependent (pacing left on)   MR CONDITIONAL STATUS AND MANAGEMENT System has been implanted more than 6 weeks?  Yes Implant date: 05/04/2017 MR NON-CONDITIONAL SYSTEM Risks and Potential Complications discussed with patient and patient consents to procedure The following conditions have been met: No fractured, epicardial, or abandoned leads MR is the best test for condition Patient is not pacemaker Dependent Device will be programmed under the direction of the Ep Attending by the Ep Device RN

## 2020-05-19 NOTE — Unmapped (Signed)
Patient returned from hemodialysis after rapid response resolved. VSS, patient had one episode of emesis and was cleaned up, linens changed. Order for phenergan obtained, given with adequate relief of nausea. Patient is currently resting with BiPAP in place, alert and oriented x4 this shift, VSS on RA. Still complains of significant back pain treated with PRN robaxin and oxycodone. Tolerating renal diet, turns self, calls appropriately. Call bell within reach, WCTM.     Problem: Adult Inpatient Plan of Care  Goal: Plan of Care Review  Outcome: Progressing  Goal: Patient-Specific Goal (Individualized)  Outcome: Progressing  Goal: Absence of Hospital-Acquired Illness or Injury  Outcome: Progressing  Intervention: Identify and Manage Fall Risk  Recent Flowsheet Documentation  Taken 05/18/2020 2000 by Evalina Field, RN  Safety Interventions:   bed alarm   commode/urinal/bedpan at bedside   environmental modification   fall reduction program maintained   lighting adjusted for tasks/safety   low bed   nonskid shoes/slippers when out of bed  Intervention: Prevent Skin Injury  Recent Flowsheet Documentation  Taken 05/18/2020 2000 by Evalina Field, RN  Skin Protection:   adhesive use limited   incontinence pads utilized  Intervention: Prevent and Manage VTE (Venous Thromboembolism) Risk  Recent Flowsheet Documentation  Taken 05/18/2020 2001 by Evalina Field, RN  VTE Prevention/Management:   anticoagulant therapy   bleeding precautions maintained   bleeding risk factors identified  Taken 05/18/2020 2000 by Evalina Field, RN  Activity Management: activity adjusted per tolerance  Goal: Optimal Comfort and Wellbeing  Outcome: Progressing  Goal: Readiness for Transition of Care  Outcome: Progressing  Goal: Rounds/Family Conference  Outcome: Progressing

## 2020-05-20 LAB — APTT
APTT: 33.1 s (ref 24.9–36.9)
HEPARIN CORRELATION: 0.2

## 2020-05-20 LAB — PROTIME-INR
INR: 1.32
PROTIME: 15.4 s — ABNORMAL HIGH (ref 10.3–13.4)

## 2020-05-20 LAB — CBC W/ AUTO DIFF
BASOPHILS ABSOLUTE COUNT: 0 10*9/L (ref 0.0–0.1)
BASOPHILS RELATIVE PERCENT: 0 %
EOSINOPHILS ABSOLUTE COUNT: 0 10*9/L (ref 0.0–0.4)
EOSINOPHILS RELATIVE PERCENT: 0.2 %
HEMATOCRIT: 36.5 % — ABNORMAL LOW (ref 41.0–53.0)
HEMOGLOBIN: 11.4 g/dL — ABNORMAL LOW (ref 13.5–17.5)
LARGE UNSTAINED CELLS: 2 % (ref 0–4)
LYMPHOCYTES ABSOLUTE COUNT: 1 10*9/L — ABNORMAL LOW (ref 1.5–5.0)
LYMPHOCYTES RELATIVE PERCENT: 8.5 %
MEAN CORPUSCULAR HEMOGLOBIN CONC: 31.4 g/dL (ref 31.0–37.0)
MEAN CORPUSCULAR HEMOGLOBIN: 29 pg (ref 26.0–34.0)
MEAN CORPUSCULAR VOLUME: 92.5 fL (ref 80.0–100.0)
MEAN PLATELET VOLUME: 8.9 fL (ref 7.0–10.0)
MONOCYTES ABSOLUTE COUNT: 0.6 10*9/L (ref 0.2–0.8)
MONOCYTES RELATIVE PERCENT: 5.4 %
NEUTROPHILS ABSOLUTE COUNT: 9.4 10*9/L — ABNORMAL HIGH (ref 2.0–7.5)
NEUTROPHILS RELATIVE PERCENT: 84.2 %
PLATELET COUNT: 298 10*9/L (ref 150–440)
RED BLOOD CELL COUNT: 3.94 10*12/L — ABNORMAL LOW (ref 4.50–5.90)
RED CELL DISTRIBUTION WIDTH: 17 % — ABNORMAL HIGH (ref 12.0–15.0)
WBC ADJUSTED: 11.2 10*9/L — ABNORMAL HIGH (ref 4.5–11.0)

## 2020-05-20 LAB — COMPREHENSIVE METABOLIC PANEL
ALBUMIN: 3 g/dL — ABNORMAL LOW (ref 3.4–5.0)
ALKALINE PHOSPHATASE: 103 U/L (ref 46–116)
ALT (SGPT): <7 U/L — ABNORMAL LOW (ref 10–49)
ANION GAP: 14 mmol/L (ref 5–14)
AST (SGOT): 10 U/L (ref <=34)
BILIRUBIN TOTAL: 0.2 mg/dL — ABNORMAL LOW (ref 0.3–1.2)
BLOOD UREA NITROGEN: 46 mg/dL — ABNORMAL HIGH (ref 9–23)
BUN / CREAT RATIO: 6
CALCIUM: 8.5 mg/dL — ABNORMAL LOW (ref 8.7–10.4)
CHLORIDE: 97 mmol/L — ABNORMAL LOW (ref 98–107)
CO2: 21 mmol/L (ref 20.0–31.0)
CREATININE: 7.3 mg/dL — ABNORMAL HIGH
EGFR CKD-EPI AA MALE: 9 mL/min/{1.73_m2} — ABNORMAL LOW (ref >=60)
EGFR CKD-EPI NON-AA MALE: 8 mL/min/{1.73_m2} — ABNORMAL LOW (ref >=60)
GLUCOSE RANDOM: 262 mg/dL — ABNORMAL HIGH (ref 70–179)
POTASSIUM: 5 mmol/L — ABNORMAL HIGH (ref 3.4–4.5)
PROTEIN TOTAL: 6.6 g/dL (ref 5.7–8.2)
SODIUM: 132 mmol/L — ABNORMAL LOW (ref 135–145)

## 2020-05-20 LAB — D-DIMER, QUANTITATIVE: D-DIMER QUANTITATIVE (CW,ML,HL,HS,CH): 595 ng{FEU}/mL — ABNORMAL HIGH (ref <=500)

## 2020-05-20 LAB — FIBRINOGEN: FIBRINOGEN LEVEL: 477 mg/dL — ABNORMAL HIGH (ref 177–386)

## 2020-05-20 MED ADMIN — FLUoxetine (PROzac) capsule 80 mg: 80 mg | ORAL | @ 14:00:00

## 2020-05-20 MED ADMIN — insulin lispro (HumaLOG) injection 1-20 Units: 1-20 [IU] | SUBCUTANEOUS | @ 04:00:00

## 2020-05-20 MED ADMIN — sevelamer (RENVELA) tablet 2,400 mg: 2400 mg | ORAL | @ 17:00:00

## 2020-05-20 MED ADMIN — allopurinoL (ZYLOPRIM) tablet 200 mg: 200 mg | ORAL | @ 14:00:00

## 2020-05-20 MED ADMIN — metoprolol succinate (TOPROL-XL) 24 hr tablet 100 mg: 100 mg | ORAL | @ 14:00:00

## 2020-05-20 MED ADMIN — pantoprazole (PROTONIX) EC tablet 40 mg: 40 mg | ORAL | @ 14:00:00

## 2020-05-20 MED ADMIN — insulin lispro (HumaLOG) injection 10 Units: 10 [IU] | SUBCUTANEOUS | @ 14:00:00 | Stop: 2020-05-20

## 2020-05-20 MED ADMIN — ranolazine (RANEXA) 12 hr tablet 500 mg: 500 mg | ORAL | @ 14:00:00

## 2020-05-20 MED ADMIN — gabapentin (NEURONTIN) capsule 100 mg: 100 mg | ORAL | @ 03:00:00

## 2020-05-20 MED ADMIN — mirtazapine (REMERON) tablet 7.5 mg: 7.5 mg | ORAL | @ 03:00:00

## 2020-05-20 MED ADMIN — acetaminophen (TYLENOL) tablet 650 mg: 650 mg | ORAL | @ 19:00:00

## 2020-05-20 MED ADMIN — valACYclovir (VALTREX) tablet 500 mg: 500 mg | ORAL | @ 23:00:00 | Stop: 2020-05-24

## 2020-05-20 MED ADMIN — insulin lispro (HumaLOG) injection 1-20 Units: 1-20 [IU] | SUBCUTANEOUS | @ 17:00:00

## 2020-05-20 MED ADMIN — nicotine (NICODERM CQ) 14 mg/24 hr patch 1 patch: 1 | TRANSDERMAL | @ 14:00:00

## 2020-05-20 MED ADMIN — insulin lispro (HumaLOG) injection 1-20 Units: 1-20 [IU] | SUBCUTANEOUS | @ 14:00:00

## 2020-05-20 MED ADMIN — sevelamer (RENVELA) tablet 2,400 mg: 2400 mg | ORAL | @ 23:00:00

## 2020-05-20 MED ADMIN — cholecalciferol (vitamin D3 25 mcg (1,000 units)) tablet 25 mcg: 25 ug | ORAL | @ 14:00:00

## 2020-05-20 MED ADMIN — atorvastatin (LIPITOR) tablet 80 mg: 80 mg | ORAL | @ 14:00:00

## 2020-05-20 MED ADMIN — insulin lispro (HumaLOG) injection 10 Units: 10 [IU] | SUBCUTANEOUS | @ 17:00:00 | Stop: 2020-05-20

## 2020-05-20 MED ADMIN — insulin glargine (LANTUS) injection 30 Units: 30 [IU] | SUBCUTANEOUS | @ 04:00:00

## 2020-05-20 MED ADMIN — bumetanide (BUMEX) tablet 2 mg: 2 mg | ORAL | @ 03:00:00

## 2020-05-20 MED ADMIN — sevelamer (RENVELA) tablet 2,400 mg: 2400 mg | ORAL | @ 14:00:00

## 2020-05-20 MED ADMIN — polyethylene glycol (MIRALAX) packet 17 g: 17 g | ORAL | @ 19:00:00 | Stop: 2020-05-20

## 2020-05-20 MED ADMIN — ranolazine (RANEXA) 12 hr tablet 500 mg: 500 mg | ORAL | @ 03:00:00

## 2020-05-20 MED ADMIN — bumetanide (BUMEX) tablet 2 mg: 2 mg | ORAL | @ 14:00:00

## 2020-05-20 MED ADMIN — ezetimibe (ZETIA) tablet 10 mg: 10 mg | ORAL | @ 03:00:00

## 2020-05-20 MED ADMIN — oxyCODONE (ROXICODONE) immediate release tablet 5 mg: 5 mg | ORAL | @ 08:00:00 | Stop: 2020-05-30

## 2020-05-20 MED ADMIN — predniSONE (DELTASONE) tablet 60 mg: 60 mg | ORAL | @ 14:00:00 | Stop: 2020-05-24

## 2020-05-20 MED ADMIN — amiodarone (PACERONE) tablet 200 mg: 200 mg | ORAL | @ 14:00:00

## 2020-05-20 MED ADMIN — insulin lispro (HumaLOG) injection 1-20 Units: 1-20 [IU] | SUBCUTANEOUS | @ 23:00:00

## 2020-05-20 MED ADMIN — ticagrelor (BRILINTA) tablet 90 mg: 90 mg | ORAL | @ 03:00:00

## 2020-05-20 MED ADMIN — isosorbide mononitrate (IMDUR) 24 hr tablet 60 mg: 60 mg | ORAL | @ 23:00:00

## 2020-05-20 MED ADMIN — acetaminophen (TYLENOL) tablet 650 mg: 650 mg | ORAL | @ 03:00:00

## 2020-05-20 MED ADMIN — gabapentin (NEURONTIN) capsule 100 mg: 100 mg | ORAL | @ 14:00:00

## 2020-05-20 MED ADMIN — ticagrelor (BRILINTA) tablet 90 mg: 90 mg | ORAL | @ 14:00:00

## 2020-05-20 MED ADMIN — insulin lispro (HumaLOG) injection 10 Units: 10 [IU] | SUBCUTANEOUS | @ 23:00:00 | Stop: 2020-05-20

## 2020-05-20 MED ADMIN — apixaban (ELIQUIS) tablet 5 mg: 5 mg | ORAL | @ 03:00:00

## 2020-05-20 MED ADMIN — amiodarone (PACERONE) tablet 200 mg: 200 mg | ORAL | @ 23:00:00

## 2020-05-20 MED ADMIN — apixaban (ELIQUIS) tablet 5 mg: 5 mg | ORAL | @ 14:00:00

## 2020-05-20 NOTE — Unmapped (Signed)
[facility] Medicine Progress Note    Assessment/Plan:    Principal Problem:    Weakness  Active Problems:    Chest pain    Tobacco use disorder    Transient alteration of awareness    Dysarthria    Paresthesia of left upper extremity    Thoracic back pain  Resolved Problems:    * No resolved hospital problems. *    Jeremy Oliver is a 50 y.o. man with PMHx as noted below who presented to [facility] with Weakness.    Left sided weakness - Dysarthria: Initially concerned for possible stroke, though symptoms have been ongoing >1 week. His facial weakness is consistent with Bells Palsy as noted below. Left arm and leg weakness is of unclear etiology - per Neurology, exam is inconsistent and his LLE weakness and foot drop are chronic. During recent admission to OSH, initial concern for spinal pathology based on CT, though repeat CT reportedly normal by their read. He was unable to get an MRI b/c of his pacemaker leads that were incompatible with MRI there. I was able to get T-spine images transferred over from [facility] - discussed with our radiology, who report T1 abnormality present in both 2/11 and 2/14 - unclear what this is, but could be mass and they do recommend imaging with MRI as well  - PT/OT/speech evaluation: Previously not interested in rehab but now open to this. Discussed more today. Per wife, she would be unable to manage him at home, so will need rehab.    - Speech: Rec aspiration precautions, otherwise regular consistent of food and liquids   - PT/OT: Recommending 5x low  - Cardiology device team consulted to evaluate if pacemaker is MRI compatible, appreciate their assistance - discuss with them in AM about pacemaker/ICD. Per wife, we are not to do anything without discussing with Dr. Andrey Farmer first.     Facial palsy consistent with Bell's palsy: Likely associated with recent COVID-19 infection, although could be due to a number of etiologies.  - Appreciate neurology consult team assistance in evaluation  - Continue prednisone 60mg  daily x7 days  - Continue valacyclovir 500mg  daily x7 days (on HD days should get after dialysis). End date: 05/24/20    Back pain: Certainly has musculoskeletal components; however initial CT at outside hospital reported possible T1 mass (see above). He has no bowel/bladder incontinence to suggest acute cord pathology and his unilateral findings do argue more for a central process. Additionally reports having an event when the pain started while picking up something heavy with a twisting motion, after which the pain developed.   - Radiology reviewed imaging with me as noted above   - If unable to obtain MRI, discuss with spine/neurosurgery regarding other possible imaging we could do.   - Team consulted the cardiology device team to see if we are able to perform MRI here (at OSH reportedly pacing wires were MRI incompatible, will note he was able to have an MRI in 2016). No note, so will circle back with them tomorrow and see if able to obtain MRI tomorrow.  - Symptomatic pain control     Left ankle pain: After fall. X-rays on admission notable for diffuse soft tissue swelling but no acute osseous abnormalities.  - Treat conservatively     CAD s/p PCI (last 02/2020): Significant disease, most recent cath 02/2020 with severe 2v disease. Often has angina with dialysis. Per notes, guarded prognosis, have discussed palliative care outpatient given ongoing symptoms.   -  Continue home metoprolol, ticagrelor, atorvastatin, zetia, isosorbide (non-HD days) and ranexa    New bruising  - pt reporting new bruising over shoulder, bilateral abdomen/sides that he says is new. Denied falling. Wife says did not have when he left. CBC, PT/INR, PTT, fibrinogen normal. Per other provider notes, present on exam yesterday.  - monitor closely  - consider discussing with hematology if worsening. Kaufman be related to eliquis and some minor trauma.    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 Per wife, needs to get his insulin in his arm, not abdomen. Discussed with nursing   - Continue lispro sliding scale    ESRD on HD [name]: Dialyzes through R tunneled catheter, typically [name] though last received HD Tuesday Feb 15 during OSH admission.  Will note he does still make urine and did receive contrasted CT after discussion of risk/benefits in ED  - Nephrology consulted, appreciate their assistance  - Continue home sevalamer    Prior seizure like episodes without EEG correlate: During prior hospitalizations has had unresponsive episodes with normal vital signs with full workup including cardiac workup, central head imaging, neurology consult and EEG. 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  -  Monitor closely    Anxiety:  - Continue home fluoxetine  - Consider palliative care consult for significant issues coping with medical diagnoses (has been referred outpatient)    Tobacco Abuse: Uses chewing tobacco, around 0.5 cans weekly per family.   - Tobacco cessation counseling evaluated patient, appreciate their assistance  - Start nicotine patch 14mg     - Start nicotine gum 4mg  every hour PRN    Resolved   Chest pain: Initially reported to ED, though improved at this point. Given this and associated back pain and neuro findings, there was concern for dissection, but CTA was negative and troponins have been negative with no acute changes on ECG.     Code Status: Full Code, wife is decision maker if he is unable, currently has capacity    F/E/N:  - Renal diet    Proph: On apixaban for anticoagulation    Vaccinated with J&J vaccine x1, had covid in January and received monoclonal antibody infusion    Dispo: PT/OT and speech consulted. Rec 5x low. Will need SNF placement - per pt and wife, will not go to [facility] given prior bad experience.   ___________________________________________________________________    Subjective / Events:   No significant changes from yesterday, other than he did note new bruising on his sides and his shoulder. Not particularly bothersome.     Physical Exam:  Temp:  [36.3 ??C (97.3 ??F)-36.6 ??C (97.9 ??F)] 36.6 ??C (97.9 ??F)  Heart Rate:  [70] 70  Resp:  [18-20] 18  BP: (116-160)/(65-89) 153/89  SpO2:  [95 %-98 %] 98 %  Body mass index is 44.89 kg/m??.    GEN: Lying in bed in NAD  CV: Regular rate and rhythm, no m/r/g, 1+ peripheral edema  RESP: Breathing comfortably on room air.   ABD: Soft, NT/ND  SKIN: Scattered bruising  over bilateral chest, posterior left neck/shoulder, bilateral abdomen  NEURO: alert, oriented x4, PERRL, EOMI, moderate left facial droop including forehead and left eye weakness.   PSYCH: normal mood, not anxious

## 2020-05-20 NOTE — Unmapped (Signed)
Patient A/Ox4. VSS. Back pain controlled with PRN oxycodone. Glucose monitored ACHS. Reports constipation, PRN miralax given. Bed alarm on and call bell within reach. Patient updated on plan of care.          Problem: Adult Inpatient Plan of Care  Goal: Plan of Care Review  Outcome: Progressing  Goal: Patient-Specific Goal (Individualized)  Outcome: Progressing  Goal: Absence of Hospital-Acquired Illness or Injury  Outcome: Progressing  Intervention: Identify and Manage Fall Risk  Recent Flowsheet Documentation  Taken 05/19/2020 0900 by Jeni Salles, RN  Safety Interventions:   low bed   environmental modification   lighting adjusted for tasks/safety   commode/urinal/bedpan at bedside   nonskid shoes/slippers when out of bed  Intervention: Prevent Skin Injury  Recent Flowsheet Documentation  Taken 05/19/2020 0900 by Jeni Salles, RN  Skin Protection:   adhesive use limited   transparent dressing maintained   tubing/devices free from skin contact  Intervention: Prevent and Manage VTE (Venous Thromboembolism) Risk  Recent Flowsheet Documentation  Taken 05/19/2020 0900 by Jeni Salles, RN  Activity Management:   activity adjusted per tolerance   activity encouraged  VTE Prevention/Management:   anticoagulant therapy   bleeding risk factors identified   bleeding precautions maintained  Goal: Optimal Comfort and Wellbeing  Outcome: Progressing  Goal: Readiness for Transition of Care  Outcome: Progressing  Goal: Rounds/Family Conference  Outcome: Progressing     Problem: Self-Care Deficit  Goal: Improved Ability to Complete Activities of Daily Living  Outcome: Progressing     Problem: Fall Injury Risk  Goal: Absence of Fall and Fall-Related Injury  Outcome: Progressing  Intervention: Promote Injury-Free Environment  Recent Flowsheet Documentation  Taken 05/19/2020 0900 by Jeni Salles, RN  Safety Interventions:   low bed   environmental modification   lighting adjusted for tasks/safety commode/urinal/bedpan at bedside   nonskid shoes/slippers when out of bed     Problem: Diabetes Comorbidity  Goal: Blood Glucose Level Within Targeted Range  Outcome: Progressing     Problem: Pain Chronic (Persistent) (Comorbidity Management)  Goal: Acceptable Pain Control and Functional Ability  Outcome: Progressing     Problem: Noninvasive Ventilation Acute  Goal: Effective Unassisted Ventilation and Oxygenation  Outcome: Progressing     Problem: Device-Related Complication Risk (Hemodialysis)  Goal: Safe, Effective Therapy Delivery  Outcome: Progressing

## 2020-05-21 LAB — CBC W/ AUTO DIFF
BASOPHILS ABSOLUTE COUNT: 0 10*9/L (ref 0.0–0.1)
BASOPHILS RELATIVE PERCENT: 0.1 %
EOSINOPHILS ABSOLUTE COUNT: 0 10*9/L (ref 0.0–0.4)
EOSINOPHILS RELATIVE PERCENT: 0.1 %
HEMATOCRIT: 39.4 % — ABNORMAL LOW (ref 41.0–53.0)
HEMOGLOBIN: 12.1 g/dL — ABNORMAL LOW (ref 13.5–17.5)
LARGE UNSTAINED CELLS: 2 % (ref 0–4)
LYMPHOCYTES ABSOLUTE COUNT: 1 10*9/L — ABNORMAL LOW (ref 1.5–5.0)
LYMPHOCYTES RELATIVE PERCENT: 7.3 %
MEAN CORPUSCULAR HEMOGLOBIN CONC: 30.7 g/dL — ABNORMAL LOW (ref 31.0–37.0)
MEAN CORPUSCULAR HEMOGLOBIN: 28.4 pg (ref 26.0–34.0)
MEAN CORPUSCULAR VOLUME: 92.4 fL (ref 80.0–100.0)
MEAN PLATELET VOLUME: 9 fL (ref 7.0–10.0)
MONOCYTES ABSOLUTE COUNT: 0.6 10*9/L (ref 0.2–0.8)
MONOCYTES RELATIVE PERCENT: 4.8 %
NEUTROPHILS ABSOLUTE COUNT: 11.1 10*9/L — ABNORMAL HIGH (ref 2.0–7.5)
NEUTROPHILS RELATIVE PERCENT: 86 %
PLATELET COUNT: 308 10*9/L (ref 150–440)
RED BLOOD CELL COUNT: 4.27 10*12/L — ABNORMAL LOW (ref 4.50–5.90)
RED CELL DISTRIBUTION WIDTH: 16.9 % — ABNORMAL HIGH (ref 12.0–15.0)
WBC ADJUSTED: 13 10*9/L — ABNORMAL HIGH (ref 4.5–11.0)

## 2020-05-21 LAB — COMPREHENSIVE METABOLIC PANEL
ALBUMIN: 3.2 g/dL — ABNORMAL LOW (ref 3.4–5.0)
ALKALINE PHOSPHATASE: 103 U/L (ref 46–116)
ALT (SGPT): <7 U/L — ABNORMAL LOW (ref 10–49)
ANION GAP: 12 mmol/L (ref 5–14)
AST (SGOT): <8 U/L (ref <=34)
BILIRUBIN TOTAL: 0.2 mg/dL — ABNORMAL LOW (ref 0.3–1.2)
BLOOD UREA NITROGEN: 60 mg/dL — ABNORMAL HIGH (ref 9–23)
BUN / CREAT RATIO: 8
CALCIUM: 8.9 mg/dL (ref 8.7–10.4)
CHLORIDE: 98 mmol/L (ref 98–107)
CO2: 22 mmol/L (ref 20.0–31.0)
CREATININE: 7.9 mg/dL — ABNORMAL HIGH
EGFR CKD-EPI AA MALE: 8 mL/min/{1.73_m2} — ABNORMAL LOW (ref >=60)
EGFR CKD-EPI NON-AA MALE: 7 mL/min/{1.73_m2} — ABNORMAL LOW (ref >=60)
GLUCOSE RANDOM: 191 mg/dL — ABNORMAL HIGH (ref 70–179)
POTASSIUM: 5.5 mmol/L — ABNORMAL HIGH (ref 3.4–4.5)
PROTEIN TOTAL: 7 g/dL (ref 5.7–8.2)
SODIUM: 132 mmol/L — ABNORMAL LOW (ref 135–145)

## 2020-05-21 LAB — PHOSPHORUS: PHOSPHORUS: 7.1 mg/dL — ABNORMAL HIGH (ref 2.4–5.1)

## 2020-05-21 MED ADMIN — insulin lispro (HumaLOG) injection 1-20 Units: 1-20 [IU] | SUBCUTANEOUS | @ 03:00:00

## 2020-05-21 MED ADMIN — predniSONE (DELTASONE) tablet 60 mg: 60 mg | ORAL | @ 19:00:00 | Stop: 2020-05-24

## 2020-05-21 MED ADMIN — apixaban (ELIQUIS) tablet 5 mg: 5 mg | ORAL | @ 03:00:00

## 2020-05-21 MED ADMIN — ticagrelor (BRILINTA) tablet 90 mg: 90 mg | ORAL | @ 03:00:00

## 2020-05-21 MED ADMIN — atorvastatin (LIPITOR) tablet 80 mg: 80 mg | ORAL | @ 19:00:00

## 2020-05-21 MED ADMIN — ezetimibe (ZETIA) tablet 10 mg: 10 mg | ORAL | @ 03:00:00

## 2020-05-21 MED ADMIN — acetaminophen (TYLENOL) tablet 650 mg: 650 mg | ORAL | @ 10:00:00

## 2020-05-21 MED ADMIN — ranolazine (RANEXA) 12 hr tablet 500 mg: 500 mg | ORAL | @ 03:00:00

## 2020-05-21 MED ADMIN — allopurinoL (ZYLOPRIM) tablet 200 mg: 200 mg | ORAL | @ 19:00:00

## 2020-05-21 MED ADMIN — nicotine (NICODERM CQ) 14 mg/24 hr patch 1 patch: 1 | TRANSDERMAL | @ 19:00:00

## 2020-05-21 MED ADMIN — heparin (porcine) 1000 unit/mL injection 8,000 Units: 8000 [IU] | INTRAVENOUS | @ 13:00:00

## 2020-05-21 MED ADMIN — acetaminophen (TYLENOL) tablet 650 mg: 650 mg | ORAL | @ 19:00:00

## 2020-05-21 MED ADMIN — diclofenac sodium (VOLTAREN) 1 % gel 2 g: 2 g | TOPICAL | @ 23:00:00

## 2020-05-21 MED ADMIN — bumetanide (BUMEX) tablet 2 mg: 2 mg | ORAL | @ 03:00:00

## 2020-05-21 MED ADMIN — gabapentin (NEURONTIN) capsule 100 mg: 100 mg | ORAL | @ 03:00:00

## 2020-05-21 MED ADMIN — insulin glargine (LANTUS) injection 35 Units: 35 [IU] | SUBCUTANEOUS | @ 03:00:00

## 2020-05-21 MED ADMIN — oxyCODONE (ROXICODONE) immediate release tablet 5 mg: 5 mg | ORAL | @ 05:00:00 | Stop: 2020-05-30

## 2020-05-21 MED ADMIN — calcitrioL (ROCALTROL) capsule 1 mcg: 1 ug | ORAL | @ 16:00:00

## 2020-05-21 MED ADMIN — cholecalciferol (vitamin D3 25 mcg (1,000 units)) tablet 25 mcg: 25 ug | ORAL | @ 19:00:00

## 2020-05-21 MED ADMIN — gentamicin-sodium citrate lock solution in NS: 2 mL | @ 16:00:00

## 2020-05-21 MED ADMIN — diclofenac sodium (VOLTAREN) 1 % gel 2 g: 2 g | TOPICAL | @ 19:00:00

## 2020-05-21 MED ADMIN — sevelamer (RENVELA) tablet 2,400 mg: 2400 mg | ORAL | @ 19:00:00

## 2020-05-21 MED ADMIN — mirtazapine (REMERON) tablet 7.5 mg: 7.5 mg | ORAL | @ 03:00:00

## 2020-05-21 MED ADMIN — acetaminophen (TYLENOL) tablet 650 mg: 650 mg | ORAL | @ 03:00:00

## 2020-05-21 MED ADMIN — senna (SENOKOT) tablet 2 tablet: 2 | ORAL | @ 03:00:00

## 2020-05-21 MED ADMIN — pantoprazole (PROTONIX) EC tablet 40 mg: 40 mg | ORAL | @ 19:00:00

## 2020-05-21 MED ADMIN — insulin lispro (HumaLOG) injection 15 Units: 15 [IU] | SUBCUTANEOUS | @ 19:00:00

## 2020-05-21 MED ADMIN — acetaminophen (TYLENOL) tablet 650 mg: 650 mg | ORAL | @ 16:00:00 | Stop: 2020-05-21

## 2020-05-21 NOTE — Unmapped (Signed)
[facility] Nephrology Hemodialysis Procedure Note     05/21/2020    Jeremy Oliver was seen and examined on hemodialysis    CHIEF COMPLAINT: End Stage Renal Disease    INTERVAL HISTORY: No acute issues    DIALYSIS TREATMENT DATA:  Estimated Dry Weight (kg): 155.5 kg (342 lb 13 oz)  Patient Goal Weight (kg): 3 kg (6 lb 9.8 oz)  Dialyzer: F-180 (98 mLs)  Dialysis Bath  Bath: 2 K+ / 2.5 Ca+  Dialysate Na (mEq/L): 137 mEq/L  Dialysate HCO3 (mEq/L): 35 mEq/L  Dialysate Total Buffer HCO3 (mEq/L): 35 mEq/L  Blood Flow Rate (mL/min): 350 mL/min  Dialysis Flow (mL/min): 800 mL/min    PHYSICAL EXAM:  Vitals:  Temp:  [36.2 ??C (97.2 ??F)-36.5 ??C (97.7 ??F)] 36.2 ??C (97.2 ??F)  Heart Rate:  [69-94] 70  BP: (125-163)/(54-91) 140/54  MAP (mmHg):  [107-110] 107  Weights:  Pre-Treatment Weight (kg): 158.5 kg (349 lb 6.9 oz)    General: in no acute distress, currently dialyzing in a chair  Pulmonary: clear to auscultation  Cardiovascular: regular rate and rhythm  Extremities: 1+  edema  Access: Right IJ tunneled catheter     LAB DATA:  Lab Results   Component Value Date    NA 132 (L) 05/21/2020    K 5.5 (H) 05/21/2020    CL 98 05/21/2020    CO2 22.0 05/21/2020    BUN 60 (H) 05/21/2020    CREATININE 7.90 (H) 05/21/2020    CALCIUM 8.9 05/21/2020    MG 1.8 05/06/2020    PHOS 2.8 05/02/2020    ALBUMIN 3.2 (L) 05/21/2020      Lab Results   Component Value Date    HCT 39.4 (L) 05/21/2020    WBC 13.0 (H) 05/21/2020        ASSESSMENT/PLAN:  End Stage Renal Disease on Intermittent Hemodialysis:  UF goal: 3L as tolerated  Adjust medications for a GFR <10  Avoid nephrotoxic agents     Bone Mineral Metabolism:  Lab Results   Component Value Date    CALCIUM 8.9 05/21/2020    CALCIUM 8.5 (L) 05/20/2020    Lab Results   Component Value Date    ALBUMIN 3.2 (L) 05/21/2020    ALBUMIN 3.0 (L) 05/20/2020      Lab Results   Component Value Date    PHOS 2.8 05/02/2020    PHOS 4.9 02/26/2020    Lab Results   Component Value Date    PTH 403.4 (H) 07/25/2019    PTH 395.4 (H) 04/14/2019      Labs appropriate, no changes.    Anemia:   Lab Results   Component Value Date    HGB 12.1 (L) 05/21/2020    HGB 11.4 (L) 05/20/2020    HGB 11.0 (L) 05/19/2020    Iron Saturation (%)   Date Value Ref Range Status   07/25/2019 11 (L) 20 - 50 % Final      Lab Results   Component Value Date    FERRITIN 150.0 07/25/2019       Anemia labs appropriate, no changes. not on ESA    Janyth Pupa, MD  [facility] Division of Nephrology & Hypertension

## 2020-05-21 NOTE — Unmapped (Signed)
Pt came in via bed, awake, alert, no complaint. Proceed with planned procedure.     Hemodialysis inpatient  Every Mon, Wed, Fri      Question Answer Comment   K+ 2 meq/L    Ca++ 2.5 meq/L    Bicarb 35 meq/L    Na+ 137 meq/L    Na+ Modeling none    Dialyzer F180NR    Dialysate Temperature (C) 36.5    BFR-As tolerated to a maximum of: Other (please specify) 375 ml/min   DFR 800 mL/min    Duration of treatment 4 Hr    Dry weight (kg) 155.5    Challenge dry weight (kg) no    Fluid removal (L) to EDW    Tubing Adult = 142 ml    Access Site Dialysis Catheter    Access Site Location Right    Keep SBP >: 90

## 2020-05-21 NOTE — Unmapped (Signed)
HEMODIALYSIS NURSE PROCEDURE NOTE       Treatment Number:  2 Room / Station:  7    Procedure Date:  05/21/20 Device Name/Number: [place]    Total Dialysis Treatment Time:  232 Min.    CONSENT:    Written consent was obtained prior to the procedure and is detailed in the medical record.  Prior to the start of the procedure, a time out was taken and the identity of the patient was confirmed via name, medical record number and date of birth.     WEIGHT:  Hemodialysis Pre-Treatment Weights     Date/Time Pre-Treatment Weight (kg) Estimated Dry Weight (kg) Patient Goal Weight (kg) Total Goal Weight (kg)    05/21/20 0732 158.5 kg (349 lb 6.9 oz) 155.5 kg (342 lb 13 oz) 3 kg (6 lb 9.8 oz) 3.55 kg (7 lb 13.2 oz)         Active Dialysis Orders (168h ago, onward)     Start     Ordered    05/17/20 1137  Hemodialysis inpatient  Every Mon, Wed, Fri      Question Answer Comment   K+ 2 meq/L    Ca++ 2.5 meq/L    Bicarb 35 meq/L    Na+ 137 meq/L    Na+ Modeling none    Dialyzer F180NR    Dialysate Temperature (C) 36.5    BFR-As tolerated to a maximum of: Other (please specify) 375 ml/min   DFR 800 mL/min    Duration of treatment 4 Hr    Dry weight (kg) 155.5    Challenge dry weight (kg) no    Fluid removal (L) to EDW    Tubing Adult = 142 ml    Access Site Dialysis Catheter    Access Site Location Right    Keep SBP >: 90        05/17/20 1136              ASSESSMENT:  General appearance: alert, cooperative and no distress  Neurologic: Alert and oriented X 3, normal strength and tone. Normal symmetric reflexes. Normal coordination and gait  Lungs: diminished breath sounds bibasilar and bilaterally  Heart: regular rate and rhythm, S1, S2 normal, no murmur, click, rub or gallop  Abdomen: soft, non-tender; bowel sounds normal; no masses,  no organomegaly      ACCESS SITE:       Hemodialysis Catheter Right Subclavian 2.2 mL 2.3 mL (Active)   Site Assessment Clean;Dry;Intact 05/21/20 1154   Proximal Lumen Status / Patency Capped 05/21/20 1154   Proximal Lumen Intervention Deaccessed 05/21/20 1154   Medial Lumen Status / Patency Capped 05/21/20 1154   Medial Lumen Intervention Deaccessed 05/21/20 1154   Dressing Intervention No intervention needed 05/21/20 1154   Dressing Status      Clean;Dry;Intact/not removed 05/21/20 1154   Verification by X-ray No 05/21/20 1154   Site Condition No complications 05/21/20 1154   Dressing Type CHG gel;Occlusive;Transparent 05/21/20 1154   Dressing Change Due 05/25/20 05/21/20 1154   Line Necessity Reviewed? Y 05/21/20 1154   Line Necessity Indications Yes - Hemodialysis 05/21/20 1154   Line Necessity Reviewed With Nephrologist 05/21/20 1154           Catheter fill volumes:    Arterial: 2.2 mL Venous: 2.3 mL   Catheter filled with 1 mg mg Gentamicin Citrate post procedure.     Patient Lines/Drains/Airways Status     Active Peripheral & Central Intravenous Access     Name  Placement date Placement time Site Days    Peripheral IV 05/05/2020 Right Antecubital 05/09/2020  1231  Antecubital  4               LAB RESULTS:  Lab Results   Component Value Date    NA 132 (L) 05/21/2020    K 5.5 (H) 05/21/2020    CL 98 05/21/2020    CO2 22.0 05/21/2020    BUN 60 (H) 05/21/2020    CREATININE 7.90 (H) 05/21/2020    GLU 191 (H) 05/21/2020    GLUF 223 (H) 06/09/2014    CALCIUM 8.9 05/21/2020    CAION 4.35 (L) 05/18/2020    PHOS 2.8 05/02/2020    MG 1.8 05/06/2020    PTH 403.4 (H) 07/25/2019    IRON 32 (L) 07/25/2019    LABIRON 11 (L) 07/25/2019    TRANSFERRIN 223.8 07/25/2019    FERRITIN 150.0 07/25/2019    TIBC 282.0 07/25/2019     Lab Results   Component Value Date    WBC 13.0 (H) 05/21/2020    HGB 12.1 (L) 05/21/2020    HCT 39.4 (L) 05/21/2020    PLT 308 05/21/2020    PHART 7.31 (L) 04/07/2017    PO2ART 124.0 (H) 04/07/2017    PCO2ART 44.3 04/07/2017    HCO3ART 22 04/07/2017    BEART -3.4 (L) 04/07/2017    O2SATART 98.9 04/07/2017    APTT 33.1 05/20/2020        VITAL SIGNS:   Temperature     Date/Time Temp Temp src       05/21/20 1155 36.8 ??C (98.2 ??F) Oral         Hemodynamics     Date/Time Pulse BP MAP (mmHg) Patient Position    05/21/20 1155 70 158/86 ??? Lying    05/21/20 1147 70 154/93 ??? Lying    05/21/20 1132 69 170/89 ??? Lying    05/21/20 1100 69 146/87 ??? Lying    05/21/20 1052 71 108/59 ??? Lying    05/21/20 1030 70 127/81 ??? Lying    05/21/20 1028 68 107/59 ??? Lying    05/21/20 1025 69 90/53 ??? Lying    05/21/20 1000 66 128/55 ??? Lying    05/21/20 0930 69 ??? ??? Lying    05/21/20 0900 70 ??? ??? Lying    05/21/20 0830 69 140/54 ??? Lying        Blood Volume Monitor     Date/Time Blood Volume Change (%) HCT HGB Critline O2 SAT %    05/21/20 1147 -7.5 % 34.2 11.6 69.1    05/21/20 1132 -7.1 % 34.1 11.6 70.7    05/21/20 1100 -4.5 % 33.1 11.3 66.1    05/21/20 1052 -3.9 % 32.9 11.2 54.5    05/21/20 1030 -5.3 % 33.4 11.4 64.9    05/21/20 1028 -5.7 % 33.6 11.4 59.3    05/21/20 1025 -6.6 % 33.9 11.5 64.2    05/21/20 1000 -6.4 % 33.8 11.5 72.2    05/21/20 0930 -5.4 % 33.5 11.4 76.2    05/21/20 0900 -4.2 % 33 11.2 79.3    05/21/20 0830 -2.7 % 32.5 11.1 76.7        Oxygen Therapy     Date/Time Resp SpO2 O2 Device FiO2 (%) O2 Flow Rate (L/min)    05/21/20 1155 22 ??? None (Room air) ??? ???    05/21/20 1147 22 ??? Nasal cannula ??? 4 L/min    05/21/20 1132 22 ??? Nasal cannula ???  4 L/min    05/21/20 1100 22 ??? Nasal cannula ??? 4 L/min    05/21/20 1052 22 ??? Nasal cannula ??? 4 L/min    05/21/20 1030 22 ??? Nasal cannula ??? 4 L/min    05/21/20 1028 ??? ??? Nasal cannula ??? 4 L/min    05/21/20 1025 22 ??? Nasal cannula ??? 4 L/min    05/21/20 1000 22 ??? Nasal cannula ??? 4 L/min    05/21/20 0930 22 ??? Nasal cannula ??? 4 L/min    05/21/20 0900 22 ??? Nasal cannula ??? 4 L/min    05/21/20 0830 22 ??? Nasal cannula ??? 4 L/min          Pre-Hemodialysis Assessment     Date/Time Therapy Number Dialyzer Hemodialysis Line Type All Machine Alarms Passed    05/21/20 0732 2 F-180 (98 mLs) Adult (142 m/s) Yes    Date/Time Air Detector Saline Line Double Clampled Hemo-Safe Applied Dialysis Flow (mL/min)    05/21/20 0732 Engaged ??? ??? 800 mL/min    Date/Time Verify Priming Solution Priming Volume Hemodialysis Independent pH Hemodialysis Machine Conductivity (mS/cm)    05/21/20 0732 0.9% NS 300 mL ??? 13.7 mS/cm    Date/Time Hemodialysis Independent Conductivity (mS/cm) Bicarb Conductivity Residual Bleach Negative Total Chlorine    05/21/20 0732 13.6 mS/cm -- Yes 0        Pre-Hemodialysis Treatment Comments     Date/Time Pre-Hemodialysis Comments    05/21/20 0732 vss. awake, alert        Hemodialysis Treatment     Date/Time Blood Flow Rate (mL/min) Arterial Pressure (mmHg) Venous Pressure (mmHg) Transmembrane Pressure (mmHg)    05/21/20 1147 350 mL/min -157 mmHg 144 mmHg 56 mmHg    05/21/20 1132 350 mL/min -157 mmHg 146 mmHg 57 mmHg    05/21/20 1100 350 mL/min -153 mmHg 132 mmHg 49 mmHg    05/21/20 1052 350 mL/min -150 mmHg 139 mmHg 40 mmHg    05/21/20 1030 350 mL/min -153 mmHg 136 mmHg 38 mmHg    05/21/20 1028 350 mL/min -158 mmHg 145 mmHg 61 mmHg    05/21/20 1025 350 mL/min -157 mmHg 148 mmHg 38 mmHg    05/21/20 1000 350 mL/min -155 mmHg 143 mmHg 58 mmHg    05/21/20 0930 350 mL/min -152 mmHg 136 mmHg 59 mmHg    05/21/20 0900 350 mL/min -141 mmHg 137 mmHg 52 mmHg    05/21/20 0830 350 mL/min -138 mmHg 134 mmHg 53 mmHg    05/21/20 0755 350 mL/min -130 mmHg 130 mmHg 50 mmHg    Date/Time Ultrafiltration Rate (mL/hr) Ultrafiltrate Removed (mL) Dialysate Flow Rate (mL/min) KECN (Kecn)    05/21/20 1147 980 mL/hr 2650 mL 800 ml/min ???    05/21/20 1132 860 mL/hr 2351 mL 800 ml/min ???    05/21/20 1100 710 mL/hr 1990 mL 800 ml/min ???    05/21/20 1052 380 mL/hr 1955 mL 800 ml/min ???    05/21/20 1030 0 mL/hr 1930 mL 800 ml/min ???    05/21/20 1028 0 mL/hr 1930 mL 800 ml/min ???    05/21/20 1025 0 mL/hr 1899 mL 800 ml/min ???    05/21/20 1000 1020 mL/hr 1517 mL 800 ml/min ???    05/21/20 0930 940 mL/hr 1005 mL 800 ml/min ???    05/21/20 0900 820 mL/hr 594 mL 800 ml/min ???    05/21/20 0830 510 mL/hr 276 mL 800 ml/min ???    05/21/20 0755 510 mL/hr 0 mL 800 ml/min ???  Hemodialysis Treatment Comments     Date/Time Intra-Hemodialysis Comments    05/21/20 1147 8 mins early per pt request. rinseback all blood.    05/21/20 1132 vss. tolerating tx well.    05/21/20 1100 vss. BP improved. Increased UFR. will monitor pt closely.    05/21/20 1052 Primary MD at bedside, ordered to give tylenol. uf on. will monitor pt closely. pt awake, alert    05/21/20 1030 UF remains off.    05/21/20 1028 rechecked BP, UF remains off. will monitor pt closely.    05/21/20 1025 c/o back pain, UF off for now due to low BP    05/21/20 1000 vss. tolerating tx well.    05/21/20 0930 vss.    05/21/20 0900 vss. tolerating tx    05/21/20 0830 vss.        Post Treatment     Date/Time Rinseback Volume (mL) On Line Clearance: spKt/V Total Liters Processed (L/min) Dialyzer Clearance    05/21/20 1154 300 mL 0.61 spKt/V 77.4 L/min Moderately streaked        Post Hemodialysis Treatment Comments     Date/Time Post-Hemodialysis Comments    05/21/20 1154 vss. NAD        Hemodialysis I/O     Date/Time Total Hemodialysis Replacement Volume (mL) Total Ultrafiltrate Output (mL)    05/21/20 1154 ??? 2000 mL          1209-1209-01 - Medicaitons Given During Treatment  (last 3 hrs)         Marry Guan, RN       Medication Name Action Time Action Route Rate Dose User     acetaminophen (TYLENOL) tablet 650 mg 05/21/20 1057 Given Oral  650 mg Marry Guan, RN     calcitrioL (ROCALTROL) capsule 1 mcg 05/21/20 1047 Given Oral  1 mcg Marry Guan, RN     gentamicin-sodium citrate lock solution in NS 05/21/20 1047 Given hemodialysis port injection  2 mL Marry Guan, RN     gentamicin-sodium citrate lock solution in NS 05/21/20 1046 Given hemodialysis port injection  2 mL Marry Guan, RN                  Patient tolerated treatment in a  Bed.

## 2020-05-21 NOTE — Unmapped (Signed)
Pt. Has been up all day. Pt. Denies any pain. Pt. Has 2 very constipated stools and voiding. Pt. Has consult for OT and PT to be seen. No other voice concerns. Will continue to monitor.   Outcome: Progressing  Goal: Patient-Specific Goal (Individualized)  Outcome: Progressing  Goal: Absence of Hospital-Acquired Illness or Injury  Outcome: Progressing  Intervention: Identify and Manage Fall Risk  Recent Flowsheet Documentation  Taken 05/20/2020 0800 by Jory Sims, RN  Safety Interventions: bed alarm  Goal: Optimal Comfort and Wellbeing  Outcome: Progressing  Goal: Readiness for Transition of Care  Outcome: Progressing  Goal: Rounds/Family Conference  Outcome: Progressing     Problem: Self-Care Deficit  Goal: Improved Ability to Complete Activities of Daily Living  Outcome: Progressing     Problem: Fall Injury Risk  Goal: Absence of Fall and Fall-Related Injury  Outcome: Progressing  Intervention: Promote Injury-Free Environment  Recent Flowsheet Documentation  Taken 05/20/2020 0800 by Jory Sims, RN  Safety Interventions: bed alarm     Problem: Diabetes Comorbidity  Goal: Blood Glucose Level Within Targeted Range  Outcome: Progressing     Problem: Pain Chronic (Persistent) (Comorbidity Management)  Goal: Acceptable Pain Control and Functional Ability  Outcome: Progressing     Problem: Noninvasive Ventilation Acute  Goal: Effective Unassisted Ventilation and Oxygenation  Outcome: Progressing     Problem: Device-Related Complication Risk (Hemodialysis)  Goal: Safe, Effective Therapy Delivery  Outcome: Progressing     Problem: Hemodynamic Instability (Hemodialysis)  Goal: Effective Tissue Perfusion  Outcome: Progressing     Problem: Infection (Hemodialysis)  Goal: Absence of Infection Signs and Symptoms  Outcome: Progressing

## 2020-05-21 NOTE — Unmapped (Signed)
[facility] Medicine Progress Note    Assessment/Plan:    Principal Problem:    Weakness  Active Problems:    Chest pain    Tobacco use disorder    Transient alteration of awareness    Dysarthria    Paresthesia of left upper extremity    Thoracic back pain  Resolved Problems:    * No resolved hospital problems. *    Jeremy Oliver is a 50 y.o. man with PMHx as noted below who presented to [facility] with Weakness.     Left sided weakness: see hospital course for detailed discussion. Evaluated by Neurology, who felt like exam was reassuring other than known left foot drop without other focal deficits. No further workup recommended per them other than physical therapy.   - Speech: Rec aspiration precautions, otherwise regular consistent of food and liquids  - PT/OT: Recommending 5x low. Previously not interested in rehab but now open to this, as is his wife.    Report of possible T1 abnormality on OSH imaging  During recent admission to [facility], initial concern for spinal pathology based on CT, though repeat CT reportedly normal by their read. He was unable to get an MRI b/c of his pacemaker leads that were incompatible with MRI there. I was able to get T-spine images transferred over from [facility] - reviewed with attending radiology today - on further review of imaging (including prior CTA chest), no evidence of true pathology and is most consistent with an artifact. I had simultaneously discussed with Cardiology if an MRI could be done - this would be tricky, but possible if needed (would have to be programmed under direction of EP attending). Given my discussion with Radiology, patient does not need an MRI, so we will hold off on pursuing this.    Facial palsy consistent with Bell's palsy: Likely associated with recent COVID-19 infection, although could be due to a number of etiologies.  - Appreciate neurology consult team assistance in evaluation  - Continue prednisone 60mg  daily x7 days (2/17-23)  - Continue valacyclovir 500mg  daily x7 days (on HD days should get after dialysis). (2/17-23)    Back pain: Suspect that this is musculoskeletal in origin - he says it started when he was picking up something heavy with a twisting motion and that it runs across both of his shoulders. Very tender to light palpation.   - tylenol scheduled   - voltaren gel   - lidocaine patch   - encourage PT/OT, up and out of bed.     Left ankle pain: After fall. X-rays on admission notable for diffuse soft tissue swelling but no acute osseous abnormalities.  - Treat conservatively     CAD s/p PCI (last 02/2020): Significant disease, most recent cath 02/2020 with severe 2v disease. Often has angina with dialysis. Per notes, guarded prognosis, have discussed palliative care outpatient given ongoing symptoms.   - Continue home metoprolol, ticagrelor, atorvastatin, zetia, isosorbide (non-HD days) and ranexa    Bruising  - pt reporting new bruising over shoulder, bilateral abdomen/sides that he says is new. Denied falling. Wife says did not have when he left. CBC, PT/INR, PTT, fibrinogen normal. 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 Discussed with nursing   - Continue lispro sliding scale  - Funches need to adjust while on pred if above goal    ESRD on HD [name]: Dialyzes through R tunneled catheter, typically [name] though last received HD Tuesday Feb 15 during OSH admission.  Will note he does still make urine and did receive contrasted CT after discussion of risk/benefits in ED  - Nephrology consulted, appreciate their assistance  - Continue home sevalamer    Prior seizure like episodes without EEG correlate: During prior hospitalizations has had unresponsive episodes with normal vital signs with full workup including cardiac workup, central head imaging, neurology consult and EEG. 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    Anxiety:  - Continue home fluoxetine  - Could consider palliative care consult for significant issues coping with medical diagnoses (has been referred outpatient)    Tobacco Abuse: Uses chewing tobacco, around 0.5 cans weekly per family.   - Tobacco cessation counseling evaluated patient, appreciate their assistance  - Start nicotine patch 14mg     - Start nicotine gum 4mg  every hour PRN    Resolved   Chest pain: Initially reported to ED, though improved at this point. Given this and associated back pain and neuro findings, there was concern for dissection, but CTA was negative and troponins have been negative with no acute changes on ECG.     Code Status: Full Code, wife is decision maker if he is unable, currently has capacity    F/E/N:  - Renal diet    Proph: On apixaban for anticoagulation    Vaccinated with J&J vaccine x1, had covid in January and received monoclonal antibody infusion    Dispo: PT/OT and speech consulted. Rec 5x low. Will need SNF placement - per pt and wife, will not go to [facility] given prior bad experience.   ___________________________________________________________________    Subjective / Events:   Seen in dialysis today - only concern from him is his back pain across his shoulders. Willing to go to rehab and looking forward to physical therapy.    Physical Exam:  Temp:  [36.4 ??C (97.5 ??F)-36.5 ??C (97.7 ??F)] 36.4 ??C (97.5 ??F)  Heart Rate: [69-70] 70  Resp:  [17-22] 20  BP: (146-163)/(70-91) 148/91  SpO2:  [92 %-97 %] 92 %  Body mass index is 44.89 kg/m??.    GEN: Sitting in HD chair   CV: Regular rate and rhythm  RESP: Breathing comfortably on room air.   ABD: Soft, NT/ND  SKIN: Scattered bruising over bilateral chest, posterior left neck/shoulder, bilateral abdomen. Stable from exam yesterday.  NEURO: alert, oriented x4, PERRL, EOMI, improving left eyelid weakness.   PSYCH: slightly anxious/upset about back pain

## 2020-05-21 NOTE — Unmapped (Addendum)
Jeremy Oliver, VSS, Pt continues to have significant back pain, PRN oxycodone admin please see MAR. Pt having adequate urinary output, no BM this shift. Dialysis RN confirmed AM dialysis, will be taken @ 0730. Pt is currently resting comfortably, will continue to monitor.  Problem: Adult Inpatient Plan of Care  Goal: Plan of Care Review  Outcome: Progressing  Goal: Patient-Specific Goal (Individualized)  Outcome: Progressing  Goal: Absence of Hospital-Acquired Illness or Injury  Outcome: Progressing  Goal: Optimal Comfort and Wellbeing  Outcome: Progressing  Goal: Readiness for Transition of Care  Outcome: Progressing  Goal: Rounds/Family Conference  Outcome: Progressing     Problem: Self-Care Deficit  Goal: Improved Ability to Complete Activities of Daily Living  Outcome: Progressing     Problem: Fall Injury Risk  Goal: Absence of Fall and Fall-Related Injury  Outcome: Progressing     Problem: Diabetes Comorbidity  Goal: Blood Glucose Level Within Targeted Range  Outcome: Progressing     Problem: Pain Chronic (Persistent) (Comorbidity Management)  Goal: Acceptable Pain Control and Functional Ability  Outcome: Progressing     Problem: Noninvasive Ventilation Acute  Goal: Effective Unassisted Ventilation and Oxygenation  Outcome: Progressing     Problem: Device-Related Complication Risk (Hemodialysis)  Goal: Safe, Effective Therapy Delivery  Outcome: Progressing     Problem: Hemodynamic Instability (Hemodialysis)  Goal: Effective Tissue Perfusion  Outcome: Progressing     Problem: Infection (Hemodialysis)  Goal: Absence of Infection Signs and Symptoms  Outcome: Progressing

## 2020-05-22 LAB — COMPREHENSIVE METABOLIC PANEL
ALBUMIN: 3 g/dL — ABNORMAL LOW (ref 3.4–5.0)
ALKALINE PHOSPHATASE: 100 U/L (ref 46–116)
ALT (SGPT): 7 U/L — ABNORMAL LOW (ref 10–49)
ANION GAP: 9 mmol/L (ref 5–14)
AST (SGOT): 9 U/L (ref <=34)
BILIRUBIN TOTAL: 0.3 mg/dL (ref 0.3–1.2)
BLOOD UREA NITROGEN: 45 mg/dL — ABNORMAL HIGH (ref 9–23)
BUN / CREAT RATIO: 8
CALCIUM: 8.7 mg/dL (ref 8.7–10.4)
CHLORIDE: 96 mmol/L — ABNORMAL LOW (ref 98–107)
CO2: 24 mmol/L (ref 20.0–31.0)
CREATININE: 5.63 mg/dL — ABNORMAL HIGH
EGFR CKD-EPI AA MALE: 12 mL/min/{1.73_m2} — ABNORMAL LOW (ref >=60)
EGFR CKD-EPI NON-AA MALE: 11 mL/min/{1.73_m2} — ABNORMAL LOW (ref >=60)
GLUCOSE RANDOM: 261 mg/dL — ABNORMAL HIGH (ref 70–179)
POTASSIUM: 5.1 mmol/L — ABNORMAL HIGH (ref 3.4–4.5)
PROTEIN TOTAL: 6.7 g/dL (ref 5.7–8.2)
SODIUM: 129 mmol/L — ABNORMAL LOW (ref 135–145)

## 2020-05-22 LAB — CBC W/ AUTO DIFF
BASOPHILS ABSOLUTE COUNT: 0 10*9/L (ref 0.0–0.1)
BASOPHILS RELATIVE PERCENT: 0.1 %
EOSINOPHILS ABSOLUTE COUNT: 0 10*9/L (ref 0.0–0.4)
EOSINOPHILS RELATIVE PERCENT: 0.1 %
HEMATOCRIT: 37.6 % — ABNORMAL LOW (ref 41.0–53.0)
HEMOGLOBIN: 12 g/dL — ABNORMAL LOW (ref 13.5–17.5)
LARGE UNSTAINED CELLS: 1 % (ref 0–4)
LYMPHOCYTES ABSOLUTE COUNT: 0.6 10*9/L — ABNORMAL LOW (ref 1.5–5.0)
LYMPHOCYTES RELATIVE PERCENT: 5.4 %
MEAN CORPUSCULAR HEMOGLOBIN CONC: 31.8 g/dL (ref 31.0–37.0)
MEAN CORPUSCULAR HEMOGLOBIN: 29.3 pg (ref 26.0–34.0)
MEAN CORPUSCULAR VOLUME: 92.2 fL (ref 80.0–100.0)
MEAN PLATELET VOLUME: 9 fL (ref 7.0–10.0)
MONOCYTES ABSOLUTE COUNT: 0.4 10*9/L (ref 0.2–0.8)
MONOCYTES RELATIVE PERCENT: 3.5 %
NEUTROPHILS ABSOLUTE COUNT: 9.2 10*9/L — ABNORMAL HIGH (ref 2.0–7.5)
NEUTROPHILS RELATIVE PERCENT: 90.2 %
PLATELET COUNT: 293 10*9/L (ref 150–440)
RED BLOOD CELL COUNT: 4.08 10*12/L — ABNORMAL LOW (ref 4.50–5.90)
RED CELL DISTRIBUTION WIDTH: 16.3 % — ABNORMAL HIGH (ref 12.0–15.0)
WBC ADJUSTED: 10.2 10*9/L (ref 4.5–11.0)

## 2020-05-22 LAB — PHOSPHORUS: PHOSPHORUS: 4.7 mg/dL (ref 2.4–5.1)

## 2020-05-22 MED ADMIN — insulin glargine (LANTUS) injection 35 Units: 35 [IU] | SUBCUTANEOUS | @ 04:00:00

## 2020-05-22 MED ADMIN — diclofenac sodium (VOLTAREN) 1 % gel 2 g: 2 g | TOPICAL | @ 03:00:00

## 2020-05-22 MED ADMIN — methocarbamoL (ROBAXIN) tablet 500 mg: 500 mg | ORAL | @ 11:00:00

## 2020-05-22 MED ADMIN — methocarbamoL (ROBAXIN) tablet 500 mg: 500 mg | ORAL | @ 03:00:00

## 2020-05-22 MED ADMIN — oxyCODONE (ROXICODONE) immediate release tablet 5 mg: 5 mg | ORAL | @ 03:00:00 | Stop: 2020-05-30

## 2020-05-22 MED ADMIN — insulin lispro (HumaLOG) injection 17 Units: 17 [IU] | SUBCUTANEOUS | @ 23:00:00

## 2020-05-22 MED ADMIN — amiodarone (PACERONE) tablet 200 mg: 200 mg | ORAL | @ 23:00:00

## 2020-05-22 MED ADMIN — bumetanide (BUMEX) tablet 2 mg: 2 mg | ORAL | @ 03:00:00

## 2020-05-22 MED ADMIN — insulin lispro (HumaLOG) injection 1-20 Units: 1-20 [IU] | SUBCUTANEOUS | @ 17:00:00

## 2020-05-22 MED ADMIN — ticagrelor (BRILINTA) tablet 90 mg: 90 mg | ORAL | @ 14:00:00

## 2020-05-22 MED ADMIN — oxyCODONE (ROXICODONE) immediate release tablet 5 mg: 5 mg | ORAL | @ 11:00:00 | Stop: 2020-05-30

## 2020-05-22 MED ADMIN — oxyCODONE (ROXICODONE) immediate release tablet 5 mg: 5 mg | ORAL | @ 16:00:00 | Stop: 2020-05-22

## 2020-05-22 MED ADMIN — atorvastatin (LIPITOR) tablet 80 mg: 80 mg | ORAL | @ 14:00:00

## 2020-05-22 MED ADMIN — allopurinoL (ZYLOPRIM) tablet 200 mg: 200 mg | ORAL | @ 14:00:00

## 2020-05-22 MED ADMIN — valACYclovir (VALTREX) tablet 500 mg: 500 mg | ORAL | @ 23:00:00 | Stop: 2020-05-24

## 2020-05-22 MED ADMIN — diclofenac sodium (VOLTAREN) 1 % gel 2 g: 2 g | TOPICAL | @ 17:00:00

## 2020-05-22 MED ADMIN — isosorbide mononitrate (IMDUR) 24 hr tablet 60 mg: 60 mg | ORAL | @ 23:00:00

## 2020-05-22 MED ADMIN — pantoprazole (PROTONIX) EC tablet 40 mg: 40 mg | ORAL | @ 14:00:00

## 2020-05-22 MED ADMIN — insulin lispro (HumaLOG) injection 17 Units: 17 [IU] | SUBCUTANEOUS | @ 15:00:00

## 2020-05-22 MED ADMIN — senna (SENOKOT) tablet 2 tablet: 2 | ORAL | @ 03:00:00

## 2020-05-22 MED ADMIN — nicotine (NICODERM CQ) 14 mg/24 hr patch 1 patch: 1 | TRANSDERMAL | @ 14:00:00

## 2020-05-22 MED ADMIN — predniSONE (DELTASONE) tablet 60 mg: 60 mg | ORAL | @ 14:00:00 | Stop: 2020-05-24

## 2020-05-22 MED ADMIN — bumetanide (BUMEX) tablet 2 mg: 2 mg | ORAL | @ 14:00:00

## 2020-05-22 MED ADMIN — melatonin tablet 3 mg: 3 mg | ORAL | @ 03:00:00

## 2020-05-22 MED ADMIN — ezetimibe (ZETIA) tablet 10 mg: 10 mg | ORAL | @ 03:00:00

## 2020-05-22 MED ADMIN — insulin lispro (HumaLOG) injection 1-20 Units: 1-20 [IU] | SUBCUTANEOUS | @ 04:00:00

## 2020-05-22 MED ADMIN — polyethylene glycol (MIRALAX) packet 17 g: 17 g | ORAL | @ 03:00:00

## 2020-05-22 MED ADMIN — sevelamer (RENVELA) tablet 2,400 mg: 2400 mg | ORAL | @ 14:00:00

## 2020-05-22 MED ADMIN — insulin lispro (HumaLOG) injection 17 Units: 17 [IU] | SUBCUTANEOUS | @ 17:00:00

## 2020-05-22 MED ADMIN — FLUoxetine (PROzac) capsule 80 mg: 80 mg | ORAL | @ 03:00:00

## 2020-05-22 MED ADMIN — metoprolol succinate (TOPROL-XL) 24 hr tablet 100 mg: 100 mg | ORAL | @ 14:00:00

## 2020-05-22 MED ADMIN — ranolazine (RANEXA) 12 hr tablet 500 mg: 500 mg | ORAL | @ 17:00:00

## 2020-05-22 MED ADMIN — acetaminophen (TYLENOL) tablet 650 mg: 650 mg | ORAL | @ 03:00:00

## 2020-05-22 MED ADMIN — gabapentin (NEURONTIN) capsule 100 mg: 100 mg | ORAL | @ 03:00:00

## 2020-05-22 MED ADMIN — sevelamer (RENVELA) tablet 2,400 mg: 2400 mg | ORAL | @ 17:00:00

## 2020-05-22 MED ADMIN — apixaban (ELIQUIS) tablet 5 mg: 5 mg | ORAL | @ 03:00:00

## 2020-05-22 MED ADMIN — sevelamer (RENVELA) tablet 2,400 mg: 2400 mg | ORAL | @ 23:00:00

## 2020-05-22 MED ADMIN — oxyCODONE (ROXICODONE) immediate release tablet 5 mg: 5 mg | ORAL | @ 21:00:00 | Stop: 2020-05-30

## 2020-05-22 MED ADMIN — acetaminophen (TYLENOL) tablet 650 mg: 650 mg | ORAL | @ 19:00:00

## 2020-05-22 MED ADMIN — ticagrelor (BRILINTA) tablet 90 mg: 90 mg | ORAL | @ 03:00:00

## 2020-05-22 MED ADMIN — insulin lispro (HumaLOG) injection 1-20 Units: 1-20 [IU] | SUBCUTANEOUS | @ 23:00:00

## 2020-05-22 MED ADMIN — cholecalciferol (vitamin D3 25 mcg (1,000 units)) tablet 25 mcg: 25 ug | ORAL | @ 14:00:00

## 2020-05-22 MED ADMIN — acetaminophen (TYLENOL) tablet 650 mg: 650 mg | ORAL | @ 11:00:00

## 2020-05-22 MED ADMIN — mirtazapine (REMERON) tablet 7.5 mg: 7.5 mg | ORAL | @ 03:00:00

## 2020-05-22 MED ADMIN — amiodarone (PACERONE) tablet 200 mg: 200 mg | ORAL | @ 14:00:00

## 2020-05-22 MED ADMIN — gabapentin (NEURONTIN) capsule 100 mg: 100 mg | ORAL | @ 15:00:00

## 2020-05-22 NOTE — Unmapped (Addendum)
[facility] Medicine Progress Note    Assessment/Plan:    Principal Problem (Resolved):    Weakness  Active Problems:    Coronary artery disease of native artery of native heart with stable angina pectoris (CMS-[name])    Obesity, Class III, BMI 40-49.9 (morbid obesity) (CMS-[name])    Osteoarthritis    Tobacco use disorder    HFrEF (heart failure with reduced ejection fraction) (CMS-[name])    Transient alteration of awareness    Paresthesia of left upper extremity    Thoracic back pain  Resolved Problems:    Chest pain    Dysarthria    Jeremy Oliver is a 50 y.o. man with PMHx as noted below who presented to [facility] with Weakness.     Left sided weakness: see hospital course for detailed discussion. Evaluated by Neurology, who felt like exam was reassuring other than known left foot drop without other focal deficits. No further workup recommended per them other than physical therapy.   - Speech: Rec aspiration precautions, otherwise regular consistent of food and liquids  - PT/OT: Recommending 5x low. Working on SNF placement.    Facial palsy consistent with Bell's palsy: Likely associated with recent COVID-19 infection, although could be due to a number of etiologies.  - Appreciate neurology consult team assistance in evaluation  - Continue prednisone 60mg  daily x7 days (2/17-23)  - Continue valacyclovir 500mg  daily x7 days (on HD days should get after dialysis). (2/17-23)    Back pain: Suspect that this is musculoskeletal in origin - he says it started when he was picking up something heavy with a twisting motion and that it runs across both of his shoulders. Very tender to light palpation.   - tylenol scheduled   - voltaren gel   - lidocaine patch   - encourage PT/OT, up and out of bed.     Left ankle pain: After fall. X-rays on admission notable for diffuse soft tissue swelling but no acute osseous abnormalities.  - Treat conservatively     Adrenal lesions - On CT abdomen and pelvis, noted to have bilateral macroscopic fat-containing heterogeneous adrenal lesions, with the right adrenal mass having significantly increased over time, now 3.3 cm versus 2 cm in 2015. Yearly the left adrenal gland now measures 2 cm, previously 1.5 cm. Given presence of macroscopic fat, adrenal myelolipoma remains present, but given interval growth presence of soft tissue components, shorter interval follow-up is suggested to ensure stability.   - prior to discharge, discuss with radiology when repeat imaging should be pursued    CAD s/p PCI (last 02/2020): Significant disease, most recent cath 02/2020 with severe 2v disease. Often has angina with dialysis. Per notes, guarded prognosis, have discussed palliative care outpatient given ongoing symptoms.   - Continue home metoprolol, ticagrelor, atorvastatin, zetia, isosorbide (non-HD days) and ranexa    Bruising  - pt reporting new bruising over shoulder, bilateral abdomen/sides that he says is new. Denied falling. Wife says did not have when he left. CBC, PT/INR, PTT, fibrinogen normal. Given new rib/flank pain and bruising, obtained CT non-con to eval for any intraabdominal hematoma or bleeding, and this was negative.   Per other provider notes, present on admission - suspect evolving bruising that likely developed after minor trauma given his Eliquis and antiplatelet agent. I did discuss today with the admitting provider, who confirmed that the bruising was present when she first saw him.   - CTM for any new bruising, but suspect NTD (current bruises have been  circled with a marker)    Paroxysmal Afib:   - Continue home amiodarone, Eliquis and toprol XL    HFrEF (20-25%, AICD in place):   - Continue Toprol XL, statin, bumex (he reports taking every day but med rec says on non-HD days)    DM2, insulin dependent: Home regimen 35 units nightly detemir and 15 units asprat with meals  - Lantus 35 units nightly and Lispro to 15u with meals. Increase to lispro 17u with meals given above goal. AM glucose ok so will keep lantus where it is    - uncontrolled BG in setting of starting prednisone 60mg  PO daily for Bells Palsy but improved today  - Per wife, needs to get his insulin in his arm, not abdomen. Discussed with nursing   - Continue lispro sliding scale    ESRD on HD [name]: Dialyzes through R tunneled catheter, typically [name] though last received HD Tuesday Feb 15 during OSH admission.  Will note he does still make urine and did receive contrasted CT after discussion of risk/benefits in ED  - Nephrology consulted, appreciate their assistance  - Continue home sevalamer    Anxiety:  - Continue home fluoxetine  - Could consider palliative care consult for significant issues coping with medical diagnoses (has been referred outpatient)    Tobacco Abuse: Uses chewing tobacco, around 0.5 cans weekly per family.   - Tobacco cessation counseling evaluated patient, appreciate their assistance  - Start nicotine patch 14mg     - Start nicotine gum 4mg  every hour PRN    Resolved   Chest pain: Initially reported to ED, though improved at this point. Given this and associated back pain and neuro findings, there was concern for dissection, but CTA was negative and troponins have been negative with no acute changes on ECG.     Report of possible T1 abnormality on OSH imaging  During recent admission to [facility], initial concern for spinal pathology based on CT, though repeat CT reportedly normal by their read. He was unable to get an MRI b/c of his pacemaker leads that were incompatible with MRI there.   We were able to get T-spine images transferred over from [facility] - reviewed with attending radiology - on further review of imaging (including prior CTA chest), no evidence of true pathology and is most consistent with an artifact. Of note, we had simultaneously discussed with Cardiology if an MRI could be done - this would be tricky, but possible if needed (would have to be programmed under direction of EP attending). Given discussion with Radiology, patient does not need an MRI, so we will hold off on pursuing this.    Prior seizure like episodes without EEG correlate: During prior hospitalizations has had unresponsive episodes with normal vital signs with full workup including cardiac workup, central head imaging, neurology consult and EEG. 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    Code Status: Full Code, wife is decision maker if he is unable, currently has capacity    F/E/N:  - Renal diet    Proph: On apixaban for anticoagulation    Vaccinated with J&J vaccine x1, had covid in January and received monoclonal antibody infusion    Dispo: PT/OT and speech consulted. Rec 5x low. Will need SNF placement - per pt and wife, will not go to [facility] given prior bad experience. Discussed with CM who is sending our SNF referrals.   ___________________________________________________________________    Subjective / Events:   Tearful this morning due to  rib pain. Otherwise, notes he is able to move his legs more and is eager to work with physical therapy.    Physical Exam:  Temp:  [36 ??C (96.8 ??F)-36.8 ??C (98.2 ??F)] 36 ??C (96.8 ??F)  Heart Rate:  [66-94] 72  Resp:  [20-24] 24  BP: (90-170)/(53-93) 112/77  SpO2:  [93 %-100 %] 97 %  Body mass index is 44.89 kg/m??.    GEN: Lying in bed, initially comfortable appearing though some anxiety around reported pain  CV: Regular rate and rhythm  RESP: Breathing comfortably on room air.   ABD: Soft, NT/ND  SKIN: Scattered bruising over bilateral chest, posterior left neck/shoulder, bilateral abdomen. Largely stable. Have circled existing bruising for monitoring.   NEURO: alert, oriented x4  PSYCH: anxious and tearful

## 2020-05-23 LAB — CBC W/ AUTO DIFF
BASOPHILS ABSOLUTE COUNT: 0 10*9/L (ref 0.0–0.1)
BASOPHILS RELATIVE PERCENT: 0.1 %
EOSINOPHILS ABSOLUTE COUNT: 0 10*9/L (ref 0.0–0.4)
EOSINOPHILS RELATIVE PERCENT: 0.2 %
HEMATOCRIT: 41.6 % (ref 41.0–53.0)
HEMOGLOBIN: 12.9 g/dL — ABNORMAL LOW (ref 13.5–17.5)
LARGE UNSTAINED CELLS: 2 % (ref 0–4)
LYMPHOCYTES ABSOLUTE COUNT: 1 10*9/L — ABNORMAL LOW (ref 1.5–5.0)
LYMPHOCYTES RELATIVE PERCENT: 6.1 %
MEAN CORPUSCULAR HEMOGLOBIN CONC: 31 g/dL (ref 31.0–37.0)
MEAN CORPUSCULAR HEMOGLOBIN: 28.7 pg (ref 26.0–34.0)
MEAN CORPUSCULAR VOLUME: 92.5 fL (ref 80.0–100.0)
MEAN PLATELET VOLUME: 9.1 fL (ref 7.0–10.0)
MONOCYTES ABSOLUTE COUNT: 1 10*9/L — ABNORMAL HIGH (ref 0.2–0.8)
MONOCYTES RELATIVE PERCENT: 6.2 %
NEUTROPHILS ABSOLUTE COUNT: 13.3 10*9/L — ABNORMAL HIGH (ref 2.0–7.5)
NEUTROPHILS RELATIVE PERCENT: 85.9 %
PLATELET COUNT: 322 10*9/L (ref 150–440)
RED BLOOD CELL COUNT: 4.5 10*12/L (ref 4.50–5.90)
RED CELL DISTRIBUTION WIDTH: 16.3 % — ABNORMAL HIGH (ref 12.0–15.0)
WBC ADJUSTED: 15.4 10*9/L — ABNORMAL HIGH (ref 4.5–11.0)

## 2020-05-23 LAB — COMPREHENSIVE METABOLIC PANEL
ALBUMIN: 3.1 g/dL — ABNORMAL LOW (ref 3.4–5.0)
ALKALINE PHOSPHATASE: 99 U/L (ref 46–116)
ALT (SGPT): <7 U/L — ABNORMAL LOW (ref 10–49)
ANION GAP: 10 mmol/L (ref 5–14)
AST (SGOT): 11 U/L (ref <=34)
BILIRUBIN TOTAL: 0.2 mg/dL — ABNORMAL LOW (ref 0.3–1.2)
BLOOD UREA NITROGEN: 55 mg/dL — ABNORMAL HIGH (ref 9–23)
BUN / CREAT RATIO: 9
CALCIUM: 9.3 mg/dL (ref 8.7–10.4)
CHLORIDE: 96 mmol/L — ABNORMAL LOW (ref 98–107)
CO2: 25 mmol/L (ref 20.0–31.0)
CREATININE: 6.17 mg/dL — ABNORMAL HIGH
EGFR CKD-EPI AA MALE: 11 mL/min/{1.73_m2} — ABNORMAL LOW (ref >=60)
EGFR CKD-EPI NON-AA MALE: 10 mL/min/{1.73_m2} — ABNORMAL LOW (ref >=60)
GLUCOSE RANDOM: 275 mg/dL — ABNORMAL HIGH (ref 70–179)
POTASSIUM: 5.6 mmol/L — ABNORMAL HIGH (ref 3.4–4.5)
PROTEIN TOTAL: 6.7 g/dL (ref 5.7–8.2)
SODIUM: 131 mmol/L — ABNORMAL LOW (ref 135–145)

## 2020-05-23 LAB — PHOSPHORUS: PHOSPHORUS: 5.9 mg/dL — ABNORMAL HIGH (ref 2.4–5.1)

## 2020-05-23 MED ADMIN — gentamicin-sodium citrate lock solution in NS: 2 mL | @ 17:00:00

## 2020-05-23 MED ADMIN — gabapentin (NEURONTIN) capsule 100 mg: 100 mg | ORAL | @ 18:00:00

## 2020-05-23 MED ADMIN — ranolazine (RANEXA) 12 hr tablet 500 mg: 500 mg | ORAL | @ 23:00:00

## 2020-05-23 MED ADMIN — atorvastatin (LIPITOR) tablet 80 mg: 80 mg | ORAL | @ 18:00:00

## 2020-05-23 MED ADMIN — allopurinoL (ZYLOPRIM) tablet 200 mg: 200 mg | ORAL | @ 18:00:00

## 2020-05-23 MED ADMIN — gabapentin (NEURONTIN) capsule 100 mg: 100 mg | ORAL | @ 02:00:00

## 2020-05-23 MED ADMIN — senna (SENOKOT) tablet 2 tablet: 2 | ORAL | @ 18:00:00

## 2020-05-23 MED ADMIN — sevelamer (RENVELA) tablet 2,400 mg: 2400 mg | ORAL | @ 22:00:00

## 2020-05-23 MED ADMIN — insulin glargine (LANTUS) injection 35 Units: 35 [IU] | SUBCUTANEOUS | @ 04:00:00

## 2020-05-23 MED ADMIN — acetaminophen (TYLENOL) tablet 650 mg: 650 mg | ORAL | @ 18:00:00

## 2020-05-23 MED ADMIN — apixaban (ELIQUIS) tablet 5 mg: 5 mg | ORAL | @ 02:00:00

## 2020-05-23 MED ADMIN — amiodarone (PACERONE) tablet 200 mg: 200 mg | ORAL | @ 18:00:00

## 2020-05-23 MED ADMIN — ticagrelor (BRILINTA) tablet 90 mg: 90 mg | ORAL | @ 18:00:00

## 2020-05-23 MED ADMIN — oxyCODONE (ROXICODONE) immediate release tablet 5 mg: 5 mg | ORAL | @ 18:00:00 | Stop: 2020-05-30

## 2020-05-23 MED ADMIN — acetaminophen (TYLENOL) tablet 650 mg: 650 mg | ORAL | @ 12:00:00

## 2020-05-23 MED ADMIN — heparin (porcine) 1000 unit/mL injection 8,000 Units: 8000 [IU] | INTRAVENOUS | @ 14:00:00

## 2020-05-23 MED ADMIN — insulin lispro (HumaLOG) injection 1-20 Units: 1-20 [IU] | SUBCUTANEOUS | @ 12:00:00

## 2020-05-23 MED ADMIN — ranolazine (RANEXA) 12 hr tablet 500 mg: 500 mg | ORAL | @ 02:00:00

## 2020-05-23 MED ADMIN — pantoprazole (PROTONIX) EC tablet 40 mg: 40 mg | ORAL | @ 18:00:00

## 2020-05-23 MED ADMIN — methocarbamoL (ROBAXIN) tablet 500 mg: 500 mg | ORAL | @ 22:00:00

## 2020-05-23 MED ADMIN — insulin lispro (HumaLOG) injection 1-20 Units: 1-20 [IU] | SUBCUTANEOUS | @ 04:00:00

## 2020-05-23 MED ADMIN — sevelamer (RENVELA) tablet 2,400 mg: 2400 mg | ORAL | @ 18:00:00

## 2020-05-23 MED ADMIN — mirtazapine (REMERON) tablet 7.5 mg: 7.5 mg | ORAL | @ 02:00:00

## 2020-05-23 MED ADMIN — valACYclovir (VALTREX) tablet 500 mg: 500 mg | ORAL | @ 22:00:00 | Stop: 2020-05-24

## 2020-05-23 MED ADMIN — metoprolol succinate (TOPROL-XL) 24 hr tablet 100 mg: 100 mg | ORAL | @ 18:00:00

## 2020-05-23 MED ADMIN — ezetimibe (ZETIA) tablet 10 mg: 10 mg | ORAL | @ 02:00:00

## 2020-05-23 MED ADMIN — oxyCODONE (ROXICODONE) immediate release tablet 5 mg: 5 mg | ORAL | @ 12:00:00 | Stop: 2020-05-30

## 2020-05-23 MED ADMIN — bumetanide (BUMEX) tablet 2 mg: 2 mg | ORAL | @ 02:00:00

## 2020-05-23 MED ADMIN — FLUoxetine (PROzac) capsule 80 mg: 80 mg | ORAL | @ 02:00:00

## 2020-05-23 MED ADMIN — polyethylene glycol (MIRALAX) packet 17 g: 17 g | ORAL | @ 18:00:00

## 2020-05-23 MED ADMIN — nicotine (NICODERM CQ) 14 mg/24 hr patch 1 patch: 1 | TRANSDERMAL | @ 18:00:00

## 2020-05-23 MED ADMIN — insulin lispro (HumaLOG) injection 17 Units: 17 [IU] | SUBCUTANEOUS

## 2020-05-23 MED ADMIN — cholecalciferol (vitamin D3 25 mcg (1,000 units)) tablet 25 mcg: 25 ug | ORAL | @ 18:00:00

## 2020-05-23 MED ADMIN — bumetanide (BUMEX) tablet 2 mg: 2 mg | ORAL | @ 18:00:00

## 2020-05-23 MED ADMIN — apixaban (ELIQUIS) tablet 5 mg: 5 mg | ORAL | @ 18:00:00

## 2020-05-23 MED ADMIN — insulin lispro (HumaLOG) injection 17 Units: 17 [IU] | SUBCUTANEOUS | @ 18:00:00

## 2020-05-23 MED ADMIN — acetaminophen (TYLENOL) tablet 650 mg: 650 mg | ORAL | @ 04:00:00

## 2020-05-23 MED ADMIN — ticagrelor (BRILINTA) tablet 90 mg: 90 mg | ORAL | @ 02:00:00

## 2020-05-23 MED ADMIN — diclofenac sodium (VOLTAREN) 1 % gel 2 g: 2 g | TOPICAL | @ 12:00:00

## 2020-05-23 MED ADMIN — insulin lispro (HumaLOG) injection 17 Units: 17 [IU] | SUBCUTANEOUS | @ 12:00:00

## 2020-05-23 MED ADMIN — calcitrioL (ROCALTROL) capsule 1 mcg: 1 ug | ORAL | @ 17:00:00

## 2020-05-23 MED ADMIN — diclofenac sodium (VOLTAREN) 1 % gel 2 g: 2 g | TOPICAL | @ 04:00:00

## 2020-05-23 MED ADMIN — sevelamer (RENVELA) tablet 2,400 mg: 2400 mg | ORAL | @ 12:00:00

## 2020-05-23 MED ADMIN — diclofenac sodium (VOLTAREN) 1 % gel 2 g: 2 g | TOPICAL | @ 22:00:00

## 2020-05-23 NOTE — Unmapped (Signed)
[facility] Nephrology Hemodialysis Procedure Note     05/23/2020    Jeremy Oliver was seen and examined on hemodialysis    CHIEF COMPLAINT: End Stage Renal Disease    INTERVAL HISTORY: No acute issues.      DIALYSIS TREATMENT DATA:  Estimated Dry Weight (kg): 155.5 kg (342 lb 13 oz)  Patient Goal Weight (kg): 3 kg (6 lb 9.8 oz)  Dialyzer: F-180 (98 mLs)  Dialysis Bath  Bath: 2 K+ / 2.5 Ca+  Dialysate Na (mEq/L): 137 mEq/L  Dialysate HCO3 (mEq/L): 35 mEq/L  Dialysate Total Buffer HCO3 (mEq/L): 35 mEq/L  Blood Flow Rate (mL/min): 375 mL/min  Dialysis Flow (mL/min): 800 mL/min    PHYSICAL EXAM:  Vitals:  Temp:  [36.1 ??C (97 ??F)-36.6 ??C (97.9 ??F)] 36.1 ??C (97 ??F)  Heart Rate:  [70-72] 70  BP: (149-189)/(76-107) 189/107  MAP (mmHg):  [97-112] 112  Weights:  Pre-Treatment Weight (kg):  (UTW)    General: in no acute distress, currently dialyzing in a chair  Pulmonary: clear to auscultation  Cardiovascular: regular rate and rhythm  Extremities: 1+ edema  Access: Right IJ tunneled catheter     LAB DATA:  Lab Results   Component Value Date    NA 131 (L) 05/23/2020    K 5.6 (H) 05/23/2020    CL 96 (L) 05/23/2020    CO2 25.0 05/23/2020    BUN 55 (H) 05/23/2020    CREATININE 6.17 (H) 05/23/2020    CALCIUM 9.3 05/23/2020    MG 1.8 05/06/2020    PHOS 5.9 (H) 05/23/2020    ALBUMIN 3.1 (L) 05/23/2020      Lab Results   Component Value Date    HCT 41.6 05/23/2020    WBC 15.4 (H) 05/23/2020        ASSESSMENT/PLAN:  End Stage Renal Disease on Intermittent Hemodialysis:  UF goal: 3L as tolerated  Recommend dosing diuretics on non-HD days  Adjust medications for a GFR <10  Avoid nephrotoxic agents     Bone Mineral Metabolism:  Lab Results   Component Value Date    CALCIUM 9.3 05/23/2020    CALCIUM 8.7 05/22/2020    Lab Results   Component Value Date    ALBUMIN 3.1 (L) 05/23/2020    ALBUMIN 3.0 (L) 05/22/2020      Lab Results   Component Value Date    PHOS 5.9 (H) 05/23/2020    PHOS 4.7 05/22/2020    Lab Results   Component Value Date    PTH 403.4 (H) 07/25/2019    PTH 395.4 (H) 04/14/2019      Labs appropriate, no changes.    Anemia:   Lab Results   Component Value Date    HGB 12.9 (L) 05/23/2020    HGB 12.0 (L) 05/22/2020    HGB 12.1 (L) 05/21/2020    Iron Saturation (%)   Date Value Ref Range Status   07/25/2019 11 (L) 20 - 50 % Final      Lab Results   Component Value Date    FERRITIN 150.0 07/25/2019       Anemia labs appropriate, no changes.  Not on any ESA.    Janyth Pupa, MD  [place] Division of Nephrology & Hypertension

## 2020-05-23 NOTE — Unmapped (Deleted)
[facility] Nephrology Hemodialysis Procedure Note     05/23/2020    Jeremy Oliver was seen and examined on hemodialysis    CHIEF COMPLAINT: End Stage Renal Disease    INTERVAL HISTORY: No acute issues    DIALYSIS TREATMENT DATA:  Estimated Dry Weight (kg): 155.5 kg (342 lb 13 oz)  Patient Goal Weight (kg): 3 kg (6 lb 9.8 oz)  Dialyzer: F-180 (98 mLs)  Dialysis Bath  Bath: 2 K+ / 2.5 Ca+  Dialysate Na (mEq/L): 137 mEq/L  Dialysate HCO3 (mEq/L): 35 mEq/L  Dialysate Total Buffer HCO3 (mEq/L): 35 mEq/L  Blood Flow Rate (mL/min): 375 mL/min  Dialysis Flow (mL/min): 800 mL/min    PHYSICAL EXAM:  Vitals:  Temp:  [36.1 ??C (97 ??F)-36.6 ??C (97.9 ??F)] 36.1 ??C (97 ??F)  Heart Rate:  [70-72] 70  BP: (133-189)/(76-107) 133/82  MAP (mmHg):  [97-112] 112  Weights:  Pre-Treatment Weight (kg):  (UTW)    General: in no acute distress, currently dialyzing in a chair  Pulmonary: clear to auscultation  Cardiovascular: regular rate and rhythm  Extremities: 1+ edema  Access: Right IJ tunneled catheter     LAB DATA:  Lab Results   Component Value Date    NA 131 (L) 05/23/2020    K 5.6 (H) 05/23/2020    CL 96 (L) 05/23/2020    CO2 25.0 05/23/2020    BUN 55 (H) 05/23/2020    CREATININE 6.17 (H) 05/23/2020    CALCIUM 9.3 05/23/2020    MG 1.8 05/06/2020    PHOS 5.9 (H) 05/23/2020    ALBUMIN 3.1 (L) 05/23/2020      Lab Results   Component Value Date    HCT 41.6 05/23/2020    WBC 15.4 (H) 05/23/2020        ASSESSMENT/PLAN:  End Stage Renal Disease on Intermittent Hemodialysis:  UF goal: 3L as tolerated  Adjust medications for a GFR <10  Avoid nephrotoxic agents     Bone Mineral Metabolism:  Lab Results   Component Value Date    CALCIUM 9.3 05/23/2020    CALCIUM 8.7 05/22/2020    Lab Results   Component Value Date    ALBUMIN 3.1 (L) 05/23/2020    ALBUMIN 3.0 (L) 05/22/2020      Lab Results   Component Value Date    PHOS 5.9 (H) 05/23/2020    PHOS 4.7 05/22/2020    Lab Results   Component Value Date    PTH 403.4 (H) 07/25/2019    PTH 395.4 (H) 04/14/2019 Labs appropriate, no changes.    Anemia:   Lab Results   Component Value Date    HGB 12.9 (L) 05/23/2020    HGB 12.0 (L) 05/22/2020    HGB 12.1 (L) 05/21/2020    Iron Saturation (%)   Date Value Ref Range Status   07/25/2019 11 (L) 20 - 50 % Final      Lab Results   Component Value Date    FERRITIN 150.0 07/25/2019       Anemia labs appropriate, no changes. not on ESA    Janyth Pupa, MD  [facility] Division of Nephrology & Hypertension

## 2020-05-23 NOTE — Unmapped (Signed)
4 hr HD treatment, fluid removal goal of 3000 ml, keepSBP>90.  Right chest HD catheter site without complications, dressing CDI.

## 2020-05-23 NOTE — Unmapped (Signed)
POC reviewed with pt; all questions answered. VSS. Pt reported rib pain throughout this shift; MD aware. PRN oxycodone and 1 time scheduled oxycodone given with some improvement. Low urine output today. BG monitored; insulin needed throughout shift. CT and chest xray completed. Remains free from falls and other injury. Report given to next shift RN.    Problem: Adult Inpatient Plan of Care  Goal: Plan of Care Review  Outcome: Progressing  Goal: Patient-Specific Goal (Individualized)  Outcome: Progressing  Goal: Absence of Hospital-Acquired Illness or Injury  Outcome: Progressing  Intervention: Identify and Manage Fall Risk  Recent Flowsheet Documentation  Taken 05/22/2020 0835 by Roderic Ovens, RN  Safety Interventions:  ??? fall reduction program maintained  ??? lighting adjusted for tasks/safety  ??? low bed  ??? nonskid shoes/slippers when out of bed  Goal: Optimal Comfort and Wellbeing  Outcome: Progressing  Goal: Readiness for Transition of Care  Outcome: Progressing  Goal: Rounds/Family Conference  Outcome: Progressing     Problem: Self-Care Deficit  Goal: Improved Ability to Complete Activities of Daily Living  Outcome: Progressing     Problem: Fall Injury Risk  Goal: Absence of Fall and Fall-Related Injury  Outcome: Progressing  Intervention: Promote Injury-Free Environment  Recent Flowsheet Documentation  Taken 05/22/2020 1610 by Roderic Ovens, RN  Safety Interventions:  ??? fall reduction program maintained  ??? lighting adjusted for tasks/safety  ??? low bed  ??? nonskid shoes/slippers when out of bed     Problem: Diabetes Comorbidity  Goal: Blood Glucose Level Within Targeted Range  Outcome: Progressing     Problem: Pain Chronic (Persistent) (Comorbidity Management)  Goal: Acceptable Pain Control and Functional Ability  Outcome: Progressing     Problem: Noninvasive Ventilation Acute  Goal: Effective Unassisted Ventilation and Oxygenation  Outcome: Progressing     Problem: Device-Related Complication Risk (Hemodialysis)  Goal: Safe, Effective Therapy Delivery  Outcome: Progressing     Problem: Hemodynamic Instability (Hemodialysis)  Goal: Effective Tissue Perfusion  Outcome: Progressing     Problem: Infection (Hemodialysis)  Goal: Absence of Infection Signs and Symptoms  Outcome: Progressing     Problem: Skin Injury Risk Increased  Goal: Skin Health and Integrity  Outcome: Progressing

## 2020-05-23 NOTE — Unmapped (Signed)
HEMODIALYSIS NURSE PROCEDURE NOTE       Treatment Number:  2 Room / Station:  7    Procedure Date:  05/23/20 Device Name/Number: Lollie Sails    Total Dialysis Treatment Time:  2,911 Min.    CONSENT:    Written consent was obtained prior to the procedure and is detailed in the medical record.  Prior to the start of the procedure, a time out was taken and the identity of the patient was confirmed via name, medical record number and date of birth.     WEIGHT:  Hemodialysis Pre-Treatment Weights     Date/Time Pre-Treatment Weight (kg) Estimated Dry Weight (kg) Patient Goal Weight (kg) Total Goal Weight (kg)    05/23/20 0756 ???  UTW 155.5 kg (342 lb 13 oz) 3 kg (6 lb 9.8 oz) 3.55 kg (7 lb 13.2 oz)         Active Dialysis Orders (168h ago, onward)     Start     Ordered    05/17/20 1137  Hemodialysis inpatient  Every Mon, Wed, Fri      Question Answer Comment   K+ 2 meq/L    Ca++ 2.5 meq/L    Bicarb 35 meq/L    Na+ 137 meq/L    Na+ Modeling none    Dialyzer [id number]    Dialysate Temperature (C) 36.5    BFR-As tolerated to a maximum of: Other (please specify) 375 ml/min   DFR 800 mL/min    Duration of treatment 4 Hr    Dry weight (kg) 155.5    Challenge dry weight (kg) no    Fluid removal (L) to EDW    Tubing Adult = 142 ml    Access Site Dialysis Catheter    Access Site Location Right    Keep SBP >: 90        05/17/20 1136              ASSESSMENT:  General appearance: alert  Neurologic: Mental status: Alert, oriented, thought content appropriate  Lungs: clear, diminished  Heart: S1, S2 normal  Abdomen: rounded      ACCESS SITE:       Hemodialysis Catheter Right Subclavian 2.2 mL 2.3 mL (Active)   Site Assessment Clean;Dry;Intact 05/23/20 1236   Proximal Lumen Status / Patency Capped 05/23/20 1236   Proximal Lumen Intervention Deaccessed 05/23/20 1236   Medial Lumen Status / Patency Capped 05/23/20 1236   Medial Lumen Intervention Deaccessed 05/23/20 1236   Dressing Intervention No intervention needed 05/23/20 1236   Dressing Status Clean;Dry;Intact/not removed 05/23/20 1236   Verification by X-ray No 05/21/20 1154   Site Condition No complications 05/23/20 1236   Dressing Type CHG gel;Occlusive;Transparent 05/23/20 1236   Dressing Change Due 05/25/20 05/23/20 1236   Line Necessity Reviewed? Y 05/23/20 1236   Line Necessity Indications Yes - Hemodialysis 05/23/20 1236   Line Necessity Reviewed With nephrology 05/23/20 [phone]           Catheter fill volumes:    Arterial: 2.2 mL Venous: 2.3 mL   Catheter filled with gentamicin citrate post procedure.     Patient Lines/Drains/Airways Status     Active Peripheral & Central Intravenous Access     Name Placement date Placement time Site Days    Peripheral IV 05/25/2020 Right Antecubital 05-25-2020  1231  Antecubital  7               LAB RESULTS:  Lab Results   Component Value Date  NA 131 (L) 05/23/2020    K 5.6 (H) 05/23/2020    CL 96 (L) 05/23/2020    CO2 25.0 05/23/2020    BUN 55 (H) 05/23/2020    CREATININE 6.17 (H) 05/23/2020    GLU 275 (H) 05/23/2020    GLUF 223 (H) 06/09/2014    CALCIUM 9.3 05/23/2020    CAION 4.35 (L) 05/18/2020    PHOS 5.9 (H) 05/23/2020    MG 1.8 05/06/2020    PTH 403.4 (H) 07/25/2019    IRON 32 (L) 07/25/2019    LABIRON 11 (L) 07/25/2019    TRANSFERRIN 223.8 07/25/2019    FERRITIN 150.0 07/25/2019    TIBC 282.0 07/25/2019     Lab Results   Component Value Date    WBC 15.4 (H) 05/23/2020    HGB 12.9 (L) 05/23/2020    HCT 41.6 05/23/2020    PLT 322 05/23/2020    PHART 7.31 (L) 04/07/2017    PO2ART 124.0 (H) 04/07/2017    PCO2ART 44.3 04/07/2017    HCO3ART 22 04/07/2017    BEART -3.4 (L) 04/07/2017    O2SATART 98.9 04/07/2017    APTT 33.1 05/20/2020        VITAL SIGNS:   Temperature     Date/Time Temp Temp src       05/23/20 1222 36.4 ??C (97.5 ??F) Oral     05/23/20 0800 36.1 ??C (97 ??F) Axillary         Hemodynamics     Date/Time Pulse BP MAP (mmHg) Patient Position    05/23/20 1222 70 154/93 ??? Lying    05/23/20 1218 70 142/86 ??? Lying    05/23/20 1200 69 122/85 ??? Lying 05/23/20 1130 66 147/55 ??? Lying    05/23/20 1115 70 147/85 ??? Lying    05/23/20 1045 70 163/102 ??? Lying    05/23/20 1030 70 168/95 ??? Lying    05/23/20 1000 70 114/73 ??? Lying    05/23/20 0956 70 109/61 ??? Lying    05/23/20 0955 70 89/55 ??? Lying    05/23/20 0953 70 100/62 ??? Lying    05/23/20 0948 58 71/29 ??? Lying    05/23/20 0943 71 86/54 ??? Lying    05/23/20 0930 ??? 133/82 ??? Lying    05/23/20 0915 ??? 189/107 ??? Lying    05/23/20 0900 70 159/91 ??? Lying    05/23/20 0830 70 159/104 ??? Lying    05/23/20 0817 70 171/99 ??? Lying        Blood Volume Monitor     Date/Time Blood Volume Change (%) HCT HGB Critline O2 SAT %    05/23/20 1200 -1.5 % 36 12.2 45.8    05/23/20 1130 -1.5 % 36 12.2 59.1    05/23/20 1115 -0.9 % 35.8 12.2 61.6    05/23/20 1045 0.5 % 35.3 12 66.7    05/23/20 1030 1.2 % 35 11.9 65.9    05/23/20 1000 1.5 % 34.9 11.9 58.1    05/23/20 0956 0.9 % 35.1 11.9 57.7    05/23/20 0955 1.4 % 35 11.9 54.2    05/23/20 0953 1.2 % 35 11.9 56    05/23/20 0948 10.8 % 32 10.9 47.1    05/23/20 0943 -5.8 % 37.6 12.8 54.4    05/23/20 0930 -3.9 % 36.9 12.6 49    05/23/20 0915 -5.8 % 37.7 12.8 63.2    05/23/20 0900 -3.7 % 36.8 12.5 61.9        Oxygen Therapy  Date/Time Resp SpO2 O2 Device O2 Flow Rate (L/min)    05/23/20 1222 18 ??? None (Room air) ???    05/23/20 1218 18 ??? None (Room air) ???    05/23/20 1200 18 ??? None (Room air) ???    05/23/20 1130 18 ??? None (Room air) ???    05/23/20 1115 18 ??? None (Room air) ???    05/23/20 1045 18 ??? None (Room air) ???    05/23/20 1030 18 ??? None (Room air) ???    05/23/20 1000 18 ??? ??? ???    05/23/20 0956 18 ??? None (Room air) ???    05/23/20 0955 17 ??? ??? ???    05/23/20 0953 20 ??? ??? ???    05/23/20 0948 18 ??? None (Room air) ???    05/23/20 0943 18 ??? None (Room air) ???    05/23/20 0930 18 ??? ??? ???    05/23/20 0915 18 ??? ??? ???    05/23/20 0900 18 ??? None (Room air) ???    05/23/20 0830 18 ??? None (Room air) ???    05/23/20 0817 18 ??? None (Room air) ???          Pre-Hemodialysis Assessment     Date/Time Therapy Number Dialyzer Hemodialysis Line Type All Machine Alarms Passed    05/23/20 0756 2 F-180 (98 mLs) Adult (142 m/s) Yes    Date/Time Air Detector Saline Line Double Clampled Hemo-Safe Applied Dialysis Flow (mL/min)    05/23/20 0756 Engaged ??? ??? 800 mL/min    Date/Time Verify Priming Solution Priming Volume Hemodialysis Independent pH Hemodialysis Machine Conductivity (mS/cm)    05/23/20 0756 0.9% NS 300 mL ???  n/a 13.7 mS/cm    Date/Time Hemodialysis Independent Conductivity (mS/cm) Bicarb Conductivity Residual Bleach Negative Total Chlorine    05/23/20 0756 13.6 mS/cm -- Yes 0        Pre-Hemodialysis Treatment Comments     Date/Time Pre-Hemodialysis Comments    05/23/20 0756 drowsy, stable        Hemodialysis Treatment     Date/Time Blood Flow Rate (mL/min) Arterial Pressure (mmHg) Venous Pressure (mmHg) Transmembrane Pressure (mmHg)    05/23/20 1218 ??? ??? ??? ???    05/23/20 1200 375 mL/min -150 mmHg 178 mmHg 49 mmHg    05/23/20 1130 375 mL/min -142 mmHg 164 mmHg 47 mmHg    05/23/20 1115 375 mL/min -143 mmHg 160 mmHg 47 mmHg    05/23/20 1045 375 mL/min -132 mmHg 157 mmHg 46 mmHg    05/23/20 1030 375 mL/min -139 mmHg 153 mmHg 30 mmHg    05/23/20 1000 375 mL/min -134 mmHg 150 mmHg 29 mmHg    05/23/20 0956 375 mL/min -141 mmHg 154 mmHg 31 mmHg    05/23/20 0955 375 mL/min -136 mmHg 154 mmHg 30 mmHg    05/23/20 0953 375 mL/min -133 mmHg 154 mmHg 30 mmHg    05/23/20 0948 375 mL/min -121 mmHg 160 mmHg 62 mmHg    05/23/20 0943 375 mL/min -150 mmHg 164 mmHg 46 mmHg    05/23/20 0930 375 mL/min -140 mmHg 169 mmHg 44 mmHg    05/23/20 0915 375 mL/min -150 mmHg 164 mmHg 43 mmHg    05/23/20 0900 375 mL/min -144 mmHg 154 mmHg 43 mmHg    05/23/20 0830 375 mL/min -124 mmHg 166 mmHg 46 mmHg    05/23/20 0817 250 mL/min -61 mmHg 32 mmHg 19 mmHg    Date/Time Ultrafiltration Rate (mL/hr) Ultrafiltrate Removed (mL) Dialysate Flow Rate (mL/min) KECN Linna Caprice)  05/23/20 1218 ??? 2750 mL ??? ???    05/23/20 1200 950 mL/hr 2418 mL 800 ml/min ???    05/23/20 1130 950 mL/hr 1953 mL 800 ml/min ???    05/23/20 1115 760 mL/hr 1730 mL 800 ml/min ???    05/23/20 1045 760 mL/hr 1351 mL 800 ml/min ???    05/23/20 1030 0 mL/hr 1239 mL 800 ml/min ???    05/23/20 1000 0 mL/hr 1239 mL 800 ml/min ???    05/23/20 0956 0 mL/hr 1239 mL 800 ml/min ???    05/23/20 0955 0 mL/hr 1239 mL 800 ml/min ???    05/23/20 0953 0 mL/hr 1239 mL 800 ml/min ???    05/23/20 0948 0 mL/hr 1239 mL 800 ml/min ???    05/23/20 0943 890 mL/hr 1214 mL 800 ml/min ???    05/23/20 0930 890 mL/hr 1036 mL 800 ml/min ???    05/23/20 0915 790 mL/hr 831 mL 800 ml/min ???    05/23/20 0900 890 mL/hr 615 mL 800 ml/min ???    05/23/20 0830 890 mL/hr 184 mL 800 ml/min ???    05/23/20 0817 890 mL/hr 0 mL 800 ml/min ???        Hemodialysis Treatment Comments     Date/Time Intra-Hemodialysis Comments    05/23/20 1218 treatment ended    05/23/20 1200 alert,s table    05/23/20 1130 on the bedpan    05/23/20 1115 resting    05/23/20 1045 stable, UF turned on, UF goal decreaed to 2750    05/23/20 1030 resting    05/23/20 1000 resting    05/23/20 0956 Dr. Hillery Hunter at bedside    05/23/20 0955 Dr. Hillery Hunter aware of hypotension    05/23/20 0943 UF turned off    05/23/20 0930 UF goal increased to 3550, critline profile A    05/23/20 0915 UF goal decreased to 3250, critline profile C    05/23/20 0900 resting    05/23/20 0830 on the phone    05/23/20 0817 treatment initiated        Post Treatment     Date/Time Rinseback Volume (mL) On Line Clearance: spKt/V Total Liters Processed (L/min) Dialyzer Clearance    05/23/20 1222 300 mL 0.66 spKt/V 85.1 L/min Moderately streaked        Post Hemodialysis Treatment Comments     Date/Time Post-Hemodialysis Comments    05/23/20 1222 VSS, NAD        Hemodialysis I/O     Date/Time Total Hemodialysis Replacement Volume (mL) Total Ultrafiltrate Output (mL)    05/23/20 1222 ??? 1800 mL          1209-1209-01 - Medicaitons Given During Treatment  (last 5 hrs)         Ulyses Jarred, RN       Medication Name Action Time Action Route Rate Dose User     calcitrioL (ROCALTROL) capsule 1 mcg 05/23/20 1156 Given Oral  1 mcg Ulyses Jarred, RN     gentamicin-sodium citrate lock solution in NS 05/23/20 1157 Given hemodialysis port injection  2 mL Ulyses Jarred, RN     gentamicin-sodium citrate lock solution in NS 05/23/20 1157 Given hemodialysis port injection  2 mL Ulyses Jarred, RN     heparin (porcine) 1000 unit/mL injection 8,000 Units 05/23/20 0837 Given Intravenous  8,000 Units Ulyses Jarred, RN                  Patient tolerated treatment in a  Bed.

## 2020-05-23 NOTE — Unmapped (Signed)
Speech Language Pathology Cognitive Linguistic Evaluation  Evaluation (05/22/20 1600)      Patient Name:  Jeremy Oliver       Medical Record Number: [id number]   Date of Birth: 01/31/1971  Sex: Male            SLP Treatment Diagnosis:  r/o acute cognitive impairment  Activity Tolerance: Patient tolerated treatment well    Assessment   Pt. presents with cognitive-communication skills that are [facility] per formal assessment measures with tasks selected from portions of the Cognstat, Cognitive-Linguistic Quick Test, and Cognitive Assessment of [place]. Pt. reports memory difficulties at baseline, though performance on structured and functional recall tasks was [facility]. Speech is clear and intelligible. Pt. remains on regular diet and tolerating without difficulty. No further ST services warranted at this time. Will sign off.    Prognosis: Good  Positive Indicators: current clinical findings  Barriers to Discharge: Functional strength deficits,Endurance deficits,Inability to safely perform ADLS,Impaired Balance,Gait instability     Plan of Care  SLP Follow-up / Frequency: D/C Services, D/C Services      Treatment Goals:  Short Term Goal 1: Pt. will participate in cognitive-communication evaluation to assess for changes from baseline and assist w/ dc planning.    Date Established : 05/17/20 Time Frame : 5 days     Patient and Family Goal: Pt. has goal to get better, to walk, and to go home.        Post Acute Discharge Recommendations  Post Acute SLP Discharge Recommendations: SLP services not indicated    Subjective    Prior Function: On disability,Independent prior to admission  Lives With: Spouse  Other Prior Function Comments: Pt's wife assists with managing cognitive ADLs and household demands   Educational Status: High school not completed (11th grade education)     Current Functional Status: Jeremy Oliver is a 50 y.o. man with PMHx as noted below who presented to [facility] with Weakness. Left sided weakness: see hospital course for detailed discussion. Evaluated by Neurology, who felt like exam was reassuring other than known left foot drop without other focal deficits. No further workup recommended per them other than physical therapy. Facial palsy consistent with Bell's palsy: Likely associated with recent COVID-19 infection, although could be due to a number of etiologies. Pt. currently on regular diet.  Communication Preference: Verbal  Patient/Caregiver Reports: Pt. reports They don't think I had a stroke. I had Bell's palsy and it hit me hard also reports I've always had a bad memory. 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 acetaminophen (TYLENOL) tablet 650 mg  650 mg Oral Q8H Lamar Laundry, MD   650 mg at 05/22/20 1354   ??? albumin human 25 % bottle 25 g  25 g Intravenous Each time in dialysis PRN Justine Null, DO       ??? albuterol (PROVENTIL HFA;VENTOLIN HFA) 90 mcg/actuation inhaler 1 puff  1 puff Inhalation Q4H PRN Lamar Laundry, MD       ??? allopurinoL (ZYLOPRIM) tablet 200 mg  200 mg Oral Daily Lamar Laundry, MD   200 mg at 05/22/20 8295   ??? amiodarone (PACERONE) tablet 200 mg  200 mg Oral BID with meals Lamar Laundry, MD   200 mg at 05/22/20 6213   ??? apixaban (ELIQUIS) tablet  5 mg  5 mg Oral BID Lamar Laundry, MD   5 mg at 05/21/20 2220   ??? atorvastatin (LIPITOR) tablet 80 mg  80 mg Oral Daily Lamar Laundry, MD   80 mg at 05/22/20 1610   ??? bumetanide (BUMEX) tablet 2 mg  2 mg Oral BID Lamar Laundry, MD   2 mg at 05/22/20 9604   ??? calcitrioL (ROCALTROL) capsule 1 mcg  1 mcg Oral Each time in dialysis Justine Null, DO   1 mcg at 05/21/20 1047   ??? cholecalciferol (vitamin D3 25 mcg (1,000 units)) tablet 25 mcg  25 mcg Oral Daily Lamar Laundry, MD   25 mcg at 05/22/20 5409   ??? dextrose 50 % in water (D50W) 50 % solution 12.5 g  12.5 g Intravenous Q10 Min PRN Lamar Laundry, MD       ??? diclofenac sodium (VOLTAREN) 1 % gel 2 g  2 g Topical QID Andres Shad, MD   2 g at 05/22/20 1159   ??? ezetimibe (ZETIA) tablet 10 mg  10 mg Oral Nightly Lamar Laundry, MD   10 mg at 05/21/20 2222   ??? FLUoxetine (PROzac) capsule 80 mg  80 mg Oral Daily Lamar Laundry, MD   80 mg at 05/21/20 2220   ??? gabapentin (NEURONTIN) capsule 100 mg  100 mg Oral BID Lamar Laundry, MD   100 mg at 05/22/20 8119   ??? gentamicin-sodium citrate lock solution in NS  2 mL hemodialysis port injection Each time in dialysis PRN Justine Null, DO   2 mL at 05/21/20 1047   ??? gentamicin-sodium citrate lock solution in NS  2 mL hemodialysis port injection Each time in dialysis PRN Justine Null, DO   2 mL at 05/21/20 1046   ??? heparin (porcine) 1000 unit/mL injection 8,000 Units  8,000 Units Intravenous Each time in dialysis Justine Null, DO   8,000 Units at 05/21/20 0802   ??? insulin glargine (LANTUS) injection 35 Units  35 Units Subcutaneous Nightly Andres Shad, MD   35 Units at 05/21/20 2243   ??? insulin lispro (HumaLOG) injection 1-20 Units  1-20 Units Subcutaneous ACHS Lamar Laundry, MD   6 Units at 05/22/20 1218   ??? insulin lispro (HumaLOG) injection 17 Units  17 Units Subcutaneous TID [facility] Andres Shad, MD   17 Units at 05/22/20 1218   ??? isosorbide mononitrate (IMDUR) 24 hr tablet 60 mg  60 mg Oral Tu,Th,Sa,Su Lamar Laundry, MD   60 mg at 05/20/20 1749   ??? melatonin tablet 3 mg  3 mg Oral Nightly PRN Lamar Laundry, MD   3 mg at 05/21/20 2221   ??? methocarbamoL (ROBAXIN) tablet 500 mg  500 mg Oral Q8H PRN Lamar Laundry, MD   500 mg at 05/22/20 1478   ??? metoprolol succinate (TOPROL-XL) 24 hr tablet 100 mg  100 mg Oral Daily Lamar Laundry, MD   100 mg at 05/22/20 2956   ??? mirtazapine (REMERON) tablet 7.5 mg  7.5 mg Oral Nightly Lamar Laundry, MD   7.5 mg at 05/21/20 2221   ??? nicotine (NICODERM CQ) 14 mg/24 hr patch 1 patch  1 patch Transdermal Daily Janelle Floor, MD   1 patch at 05/22/20 2130   ??? nicotine polacrilex (NICORETTE) gum 4 mg  4 mg Buccal Q1H PRN Janelle Floor, MD       ??? nitroglycerin (NITROSTAT)  SL tablet 0.4 mg  0.4 mg Sublingual Q5 Min PRN Janelle Floor, MD   0.4 mg at 05/17/20 1028   ??? oxyCODONE (ROXICODONE) immediate release tablet 5 mg  5 mg Oral Q6H PRN Lamar Laundry, MD   5 mg at 05/22/20 1533   ??? pantoprazole (PROTONIX) EC tablet 40 mg  40 mg Oral Daily Lamar Laundry, MD   40 mg at 05/22/20 1610   ??? polyethylene glycol (MIRALAX) packet 17 g  17 g Oral BID Andres Shad, MD   17 g at 05/21/20 2221   ??? predniSONE (DELTASONE) tablet 60 mg  60 mg Oral Daily Janelle Floor, MD   60 mg at 05/22/20 9604   ??? ranolazine (RANEXA) 12 hr tablet 500 mg  500 mg Oral BID Andres Shad, MD   500 mg at 05/22/20 1158   ??? senna (SENOKOT) tablet 2 tablet  2 tablet Oral BID Andres Shad, MD   2 tablet at 05/21/20 2221   ??? sevelamer (RENVELA) tablet 2,400 mg  2,400 mg Oral 3xd Meals Lamar Laundry, MD   2,400 mg at 05/22/20 1158   ??? ticagrelor (BRILINTA) tablet 90 mg  90 mg Oral BID Lamar Laundry, MD   90 mg at 05/22/20 5409   ??? valACYclovir (VALTREX) tablet 500 mg  500 mg Oral Daily Janelle Floor, MD   500 mg at 05/20/20 1751      Patient Active Problem List    Diagnosis Date Noted   ??? Transient alteration of awareness 05/17/2020   ??? Paresthesia of left upper extremity 05/17/2020   ??? Thoracic back pain 05/17/2020   ??? Mass of thoracic structure 05/11/2020   ??? COVID-19 04/26/2020   ??? Dilated cardiomyopathy (CMS-[name]) 04/23/2020   ??? Gout 04/12/2020   ??? Mixed anxiety and depressive disorder 03/20/2020   ??? Psychophysiological insomnia 08/15/2019   ??? Anemia in chronic kidney disease 08/05/2019   ??? Iron deficiency anemia 08/05/2019   ??? ESRD needing dialysis (CMS-[name])    ??? QT prolongation 06/06/2019   ??? Type 2 diabetes mellitus with stage 4 chronic kidney disease, with long-term current use of insulin (CMS-[name]) 07/28/2018   ??? Chronic anticoagulation 02/17/2018   ??? Decreased sensation of lower extremity, left 01/17/2018   ??? Acquired left foot drop 01/14/2018   ??? HFrEF (heart failure with reduced ejection fraction) (CMS-[name]) 05/13/2017   ??? Hyperkalemia 05/13/2017   ??? Cardiac pacemaker in situ 05/19/2016   ??? Recurrent major depressive disorder, in full remission (CMS-[name]) 04/08/2016   ??? Status post cardiac pacemaker procedure 07/02/2015   ??? Tobacco use disorder 05/28/2015   ??? OSA (obstructive sleep apnea) 01/08/2015   ??? Paroxysmal atrial fibrillation (CMS-[name]) 06/08/2014   ??? Chronic pain syndrome 04/11/2014   ??? Mild intermittent asthma without complication 04/15/2013   ??? GERD (gastroesophageal reflux disease) 07/21/2012   ??? Coronary artery disease of native artery of native heart with stable angina pectoris (CMS-[name]) 02/03/2012   ??? Osteoarthritis 02/03/2012   ??? Chronic nonalcoholic liver disease 02/03/2012   ??? Obesity, Class III, BMI 40-49.9 (morbid obesity) (CMS-[name]) 06/28/2009   ??? 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 Idiopathic chronic gout of left knee 05/18/2009   ??? Hypercholesteremia 05/18/2009     Past Medical History:   Diagnosis Date   ??? Angina at rest (CMS-[name])    ??? Angina pectoris (CMS-[name]) 08/12/2019    Added automatically from request for surgery [id number]   ???  Arthritis    ??? Asthma    ??? Atrial fibrillation and flutter (CMS-[name]) 06/08/14   ??? BMI 40.0-44.9, adult (CMS-[name]) 10/29/2015   ??? Chronic anticoagulation 02/17/2018   ??? Chronic pain syndrome 04/11/2014    ~Use daily lyrica as recommended ~Tramadol as needed for pain ~Fluoxetine daily as recommended ~Daily exercise ~Eat a healthy diet ~Good control of your blood sugars can help    ??? Coronary artery disease    ??? COVID-19 04/26/2020   ??? Depression    ??? Diabetes mellitus with coincident hypertension (CMS-[name]) 05/18/2009    Take medications are prescribed Meet with dietitian for weight loss plan The DASH Diet can lower your blood pressure Limit alcohol to less than 2 drinks a day for men and less than 1 drink a day for women    ??? Diabetes mellitus with gastroparesis (CMS-[name]) 10/29/2015   ??? ESRD (end stage renal disease) on dialysis (CMS-[name])    ??? Eye trauma 1998    glass in both eyes and removed   ??? GERD (gastroesophageal reflux disease)    ??? Gout    ??? Homeless    ??? Hyperlipidemia    ??? Hypertension    ??? Illiteracy and low-level literacy    ??? Microscopic hematuria    ??? Migraine    ??? Nephrolithiasis    ??? 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 Non-ST elevation (NSTEMI) myocardial infarction (CMS-[name]) 03/13/2020   ??? Nonalcoholic fatty liver disease    ??? NSTEMI (non-ST elevation myocardial infarction) (CMS-[name]) 01/13/2017   ??? Obesity    ??? Obstructive sleep apnea 2009   ??? Panic attacks    ??? 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 Seizure-like activity (CMS-[name])     ~Monitor for symptoms and report them to your primary care provider if they occur    ??? Systolic CHF, chronic (CMS-[name])     EF 40-45% 2017   ??? TIA (transient ischemic attack)        Past Surgical History:   Procedure Laterality Date   ??? CARDIAC CATHETERIZATION  03/08/2007   ??? CORONARY STENT PLACEMENT     ??? CYSTOURETHROSCOPY  12/31/2009     Microscopic hematuria   ??? KNEE SURGERY Right 09/23/2006    arthroscopic knee surgery medial and lateral meniscus tears as well as crystal disease   ??? PR CATH PLACE/CORON ANGIO, IMG SUPER/INTERP,R&L HRT CATH, L HRT VENTRIC N/A 02/13/2017    Procedure: Left/Right Heart Catheterization;  Surgeon: Marlaine Hind, MD; Location: [facility] CATH;  Service: Cardiology   ??? PR CATH PLACE/CORON ANGIO, IMG SUPER/INTERP,W LEFT HEART VENTRICULOGRAPHY N/A 03/17/2017    Procedure: Left Heart Catheterization W Intervention;  Surgeon: Marlaine Hind, MD;  Location: [facility] CATH;  Service: Cardiology   ??? PR CATH PLACE/CORON ANGIO, IMG SUPER/INTERP,W LEFT HEART VENTRICULOGRAPHY N/A 05/12/2018    Procedure: CATH LEFT HEART CATHETERIZATION W INTERVENTION;  Surgeon: Dorathy Kinsman, MD;  Location: [facility] CATH;  Service: Cardiology   ??? PR CATH PLACE/CORON ANGIO, IMG SUPER/INTERP,W LEFT HEART VENTRICULOGRAPHY N/A 10/22/2018    Procedure: CATH LEFT HEART CATHETERIZATION W  INTERVENTION;  Surgeon: Marlaine Hind, MD;  Location: [facility] CATH;  Service: Cardiology   ??? PR CATH PLACE/CORON ANGIO, IMG SUPER/INTERP,W LEFT HEART VENTRICULOGRAPHY N/A 08/19/2019    Procedure: Left Heart Catheterization W Intervention;  Surgeon: Marlaine Hind, MD;  Location: [facility] CATH;  Service: Cardiology   ??? PR CATH PLACE/CORON ANGIO, IMG SUPER/INTERP,W LEFT HEART VENTRICULOGRAPHY N/A 03/27/2020    Procedure: CATH LEFT HEART CATHETERIZATION W INTERVENTION;  Surgeon: Marlaine Hind, MD;  Location: [facility] CATH;  Service: Cardiology   ??? PR COMPRE EP EVAL ABLTJ 3D MAPG TX SVT N/A 07/19/2015    Catheter ablation of cavotricuspid isthmus for atrial flutter [facility])   ??? PR ECMO/ECLS INITIATION VENO-ARTERIAL N/A 03/19/2017    Procedure: ECMO/ECLS; INITIATION, VENO-ARTERIAL;  Surgeon: Lennie Odor, MD;  Location: MAIN OR [facility];  Service: Cardiac Surgery   ??? PR ECMO/ECLS RMVL PRPH CANNULA OPEN 6 YRS & OLDER Left 03/25/2017    Procedure: ECMO / ECLS PROVIDED BY PHYSICIAN; REMOVAL OF PERIPHERAL (ARTERIAL AND/OR VENOUS) CANNULA(E), OPEN, 6 YEARS AND OLDER;  Surgeon: Alonna Buckler Ikonomidis, MD;  Location: MAIN OR [facility];  Service: Cardiac Surgery   ??? PR INSER HART PACER XVENOUS ATRIAL N/A 05/30/2015    [employer] dual-chamber pacemaker implant (E.Chung)   ??? PR INSERT/PLACE FLOW DIRECT CATH N/A 03/17/2017    Procedure: Insert Leave In [place];  Surgeon: Marlaine Hind, MD;  Location: [facility] CATH;  Service: Cardiology   ??? PR INSJ/RPLCMT PERM DFB W/TRNSVNS LDS 1/DUAL CHMBR N/A 05/04/2017    Procedure: ICD Implant System (Single/Dual);  Surgeon: Eldred Manges, MD;  Location: [facility] CATH;  Service: Cardiology   ??? PR NEGATIVE PRESSURE WOUND THERAPY DME >50 SQ CM Left 03/25/2017    Procedure: Neg Press Wound Tx (Vac Assist) Incl Topicals, Per Session, Tsa Greater Than/= 50 Cm Squared;  Surgeon: Alonna Buckler Ikonomidis, MD;  Location: MAIN OR [facility];  Service: Cardiac Surgery   ??? PR PRQ TRLUML CORONARY STENT W/ANGIO ONE ART/BRNCH N/A 03/12/2018    Procedure: Percutaneous Coronary Intervention;  Surgeon: Marlaine Hind, MD;  Location: [facility] CATH;  Service: Cardiology   ??? PR PRQ TRLUML CORONARY STENT W/ANGIO ONE ART/BRNCH N/A 01/27/2020    Procedure: Percutaneous Coronary Intervention;  Surgeon: Marlaine Hind, MD;  Location: [facility] CATH;  Service: Cardiology   ??? PR REBL VES DIRECT,LOW EXTREM Left 03/19/2017    Procedure: Repr Bld Vessel Direct; Lower Extrem;  Surgeon: Boykin Reaper, MD;  Location: MAIN OR [facility];  Service: Vascular   ??? PR REMV ART CLOT ILIAC-POP,LEG INCIS Left 03/25/2017    Procedure: EMBOLECTOMY OR THROMBECTOMY, WITH OR WITHOUT CATHETER; FEMOROPOPLITEAL, AORTOILIAC ARTERY, BY LEG INCISION;  Surgeon: Alonna Buckler Ikonomidis, MD;  Location: MAIN OR [facility];  Service: Cardiac Surgery   ??? PR UPPER GI ENDOSCOPY,BIOPSY N/A 02/28/2016    Procedure: UGI ENDOSCOPY; WITH BIOPSY, SINGLE OR MULTIPLE;  Surgeon: Liane Comber, MD;  [facility]: [facility];  Service: Gastroenterology   ??? PR UPPER GI ENDOSCOPY,DIAGNOSIS N/A 05/07/2016    Procedure: UGI ENDO, INCLUDE ESOPHAGUS, STOMACH, & DUODENUM &/OR JEJUNUM; DX W/WO COLLECTION SPECIMN, BY BRUSH OR WASH;  Surgeon: Modena Nunnery, MD;  Location: [facility];  Service: Gastroenterology Social History     Tobacco Use   ??? Smoking status: Never Smoker   ??? Smokeless tobacco: Current User     Types: Chew   ??? Tobacco comment: 05/17/2020-spoke to wife who reports 1 can of dip every two weeks.   Substance Use  Topics   ??? Alcohol use: Yes     Alcohol/week: 0.0 standard drinks     Comment: rare use: couple times a year, small amount     Family History   Problem Relation Age of Onset   ??? Diabetes Mother    ??? Hyperlipidemia Mother    ??? Heart disease Mother    ??? Basal cell carcinoma Mother    ??? Blindness Mother         diabeties   ??? Obesity Mother    ??? Hyperlipidemia Father    ??? Heart disease Father    ??? Lung disease Father    ??? Heart disease Half-Sister    ??? Kidney disease Half-Sister    ??? Gallbladder disease Half-Brother    ??? Obesity Half-Brother    ??? No Known Problems Daughter    ??? No Known Problems Son    ??? Obesity Daughter    ??? Depression Maternal Uncle         Died by suicide in October 15, 2017 by gunshot   ??? Melanoma Neg Hx    ??? Squamous cell carcinoma Neg Hx    ??? Macular degeneration Neg Hx    ??? Glaucoma Neg Hx        General:   Medical Tests / Procedures Comments: CXR 2/22: No acute airspace disease. Head CT 05/20/20: Unremarkable head CT.  Equipment/Environment: None    Precautions / Restrictions  Precautions: Falls precautions  Weight Bearing Status: Non-applicable  Required Braces or Orthoses: Non-applicable    Objective  Swallow Status: Currently on regular diet. Tolerating w/o difficulty  Motor Speech: WFL   Oral Mechanism : WFL   Orientation: WFL 12/12  Attention: Attention is within functional limits  Attention Comments: Digit repetition task 6/8 [facility]  Memory: Short term memory is within functional limits  Memory Comments: 4 word delayed recall 9/12, recall for recent events intact.  Problem Solving: Problem solving is within functional limits  Problem Solving Comments: Verbal/abstract reasoning (similarities task 6/8 [facility]), Mental calculations 3/4 WFL, executive function (clock drawing task) 13/13 WFL  Safety/Judgement: Safety/Judgement is within functional limits  Safety/Judgement Comments: Safety ?s 8/8, judgment ?s 6/6  Insight/Mental Flexibility/Initiation/Processing: Insight is within functional limits,Task initiation is within functional limits,Flexibility of thought is within functional limits,Organization is within functional limits,Processing speed is within functional limits  Verbal Expression: Verbal Expression is within functional limits  Auditory Comprehension: Auditory Comprehension is within functional limits     Written Expression: Written Expression is within functional limits  Reading Comprehension: Reading ability is within functional limits        Activity Tolerance: Patient tolerated treatment well  Medical Staff Made Aware: RN    Speech Therapy Session Duration  SLP Individual [mins]: 32      I attest that I have reviewed the above information.  Signed: Susa Griffins, CCC-SLP  Filed 05/22/2020

## 2020-05-23 NOTE — Unmapped (Signed)
[facility] Hospitalist Daily Progress Note     LOS: 7 days     Assessment/Plan:  Principal Problem (Resolved):    Weakness  Active Problems:    Coronary artery disease of native artery of native heart with stable angina pectoris (CMS-[name])    Obesity, Class III, BMI 40-49.9 (morbid obesity) (CMS-[name])    Osteoarthritis    Tobacco use disorder    HFrEF (heart failure with reduced ejection fraction) (CMS-[name])    Transient alteration of awareness    Paresthesia of left upper extremity    Thoracic back pain  Resolved Problems:    Chest pain    Dysarthria         Left sided weakness:   See hospital course for detailed discussion. Evaluated by Neurology, who felt like exam was reassuring other than known left foot drop without other focal deficits. No further workup recommended per them other than physical therapy.   - Speech: Rec aspiration precautions, otherwise regular consistent of food and liquids  - PT/OT: Recommending 5x low. Working on SNF placement.  ??  Facial palsy consistent with Bell's palsy:   Possibly associated with recent COVID-19 infection, though could be due to a number of etiologies. Will complete therapy today.   - Appreciate neurology consult team assistance in evaluation  - Prednisone 60mg  daily x7 days (2/17-23)  - Continue valacyclovir 500mg  daily x7 days (on HD days should get after dialysis). (2/17-23)  ??  Back pain   Presumably musculoskeletal in origin - he says it started when he was picking up something heavy with a twisting motion and that it runs across both of his shoulders. Very tender to light palpation.   - Tylenol q8h   - Voltaren gel   - lidocaine patch   - PT/OT,   - Encourage mobilization   ??  Left ankle pain:   After fall. X-rays on admission notable for diffuse soft tissue swelling but no acute osseous abnormalities.  - Treat conservatively   ??  Adrenal lesions   On CT abdomen and pelvis, noted to have bilateral macroscopic fat-containing heterogeneous adrenal lesions, with the right adrenal mass having significantly increased over time, now 3.3 cm versus 2 cm in 2015. Yearly the left adrenal gland now measures 2 cm, previously 1.5 cm. Given presence of macroscopic fat, adrenal myelolipoma remains present, but given interval growth presence of soft tissue components, shorter interval follow-up is suggested to ensure stability.   [  ] prior to DC, discuss with Radiology about appropriate imaging interval  ??  CAD s/p PCI (last 02/2020):   Significant disease, most recent cath 02/2020 with severe 2v disease. Often has angina with dialysis. Per notes, guarded prognosis, have discussed palliative care outpatient given ongoing symptoms.   - Continue home metoprolol, ticagrelor, atorvastatin, zetia, isosorbide (non-HD days) and ranexa  ??  Ecchymosis  -   He reported new bruising over shoulder, bilateral abdomen/sides. No falls or known trauma. (Yet, developed left ankle pain after a fall).  CBC, PT/INR, PTT, fibrinogen normal.  Hemoglobin has not been trending down since admission. Abdominal CT not suggestive of bleeding.  This is presumably evolving ecchymosis that likely developed after minor trauma while on Eliquis and antiplatelet agent. All lesions appear to be decreasing in size.   - Follow clinically.   ??  Paroxysmal Afib:   HR acceptable, rhythm has been paced most of admission   - Continue home amiodarone, Eliquis and toprol XL  ??  HFrEF (20-25%, AICD in place):   -  Continue Toprol XL, statin, bumex (he reports taking every day but med rec says on non-HD days)  ??  DM2, insulin dependent:   Home regimen 35 units nightly detemir and 15 units asprat TIC AC. Glycemic control sub optimal with steroids. Eustice see improvement tomorrow after completion of prednisone today    - Lantus 35 units qhs  - Lispro to 17u TID AC   - SSI Standard scale   - Per wife, needs to get his insulin in his arm, not abdomen. Nursing is aware     ??  ESRD on HD [name]:   Dialyzes through R tunneled catheter, typically [name]. Still makes some urine.   - Nephrology following  - Home sevalamer  ??  Anxiety:  - Home fluoxetine  - Consider Palliative Care consult for significant issues coping with medical diagnoses (has been referred outpatient)  ??  Tobacco Abuse:   Chewing tobacco, around 0.5 cans weekly per family.   - Tobacco cessation counseling evaluated patient, appreciate their assistance  - Nicotine patch 14mg     - Nicotine gum 4mg  every hour PRN  ??  Resolved   Chest pain: Reported to ED. Given associated back pain and neuro findings, there was concern for dissection, but CTA was negative and troponins have been negative. ECG with no acute changes as well.    ??  Report of possible T1 abnormality on OSH imaging  During recent admission to [facility], initial concern for spinal pathology based on CT. Repeat CT reportedly normal by their read. He was unable to get an MRI b/c of his pacemaker leads that were incompatible with MRI.   T-spine images transferred from [facility] - reviewed with [facility] attending radiology - on further review of imaging (including prior CTA chest), no evidence of true pathology and is most consistent with an artifact.  Cardiology notes while an MRI could be done - this would be tricky, but possible, and would have to be programmed under direction of EP attending.   ??  Prior seizure like episodes without EEG correlate: During prior hospitalizations has had unresponsive episodes with normal vital signs with full workup including cardiac workup, central head imaging, neurology consult and EEG. 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  ??    DVT prophylaxis. Apixaban      Disposition: Floor, DC to SNF when able      Please page the Hospitalist [facility] pager at [phone] with questions.        Pending labs:   Pending Labs     Order Current Status    Urine Culture In process          Subjective:   No new ecchymosis on chest/flanks/abdomen. Tolerating dialysis this morning. No new medical complaints currently. Objective:   Physical Exam:    GEN: Supine in dialysis chair/bed.    RESPIRATORY: Breathing pattern was normal and the chest moved symmetrically.  Lung sounds were normal and there were no adventitious sounds.    CARDIOVASCULAR: Heart rate and rhythm were normal.  S1 and S2 were normal and there were no extra sounds or murmurs. Jugular venous pressure was normal.  There was no peripheral edema.  ABDOMEN: The abdomen was obese in contour.  Bowel sounds were present.    Palpation detected no tenderness. Percussion detected no tenderness.   SKIN/HAIR/NAILS: 4-6cm ecchymotic lesions on lateral abdomen/flank, and chest.   NEUROLOGIC: Mental status was normal.  The patient was normally coordinated in the arms.   PSYCHIATRIC: The patient  was oriented to person, place, time, and circumstance. Speech was normal. Mood and affect were normal.     Vital signs in last 24 hours:  Temp:  [36.1 ??C (97 ??F)-36.6 ??C (97.9 ??F)] 36.4 ??C (97.5 ??F)  Heart Rate:  [58-72] 68  Resp:  [16-20] 16  BP: (71-189)/(29-107) 139/82  MAP (mmHg):  [97-112] 98  FiO2 (%):  [30 %] 30 %  SpO2:  [81 %-100 %] 100 %    Intake/Output last 24 hours:    Intake/Output Summary (Last 24 hours) at 05/23/2020 1329  Last data filed at 05/23/2020 1222  Gross per 24 hour   Intake 240 ml   Output 3475 ml   Net -3235 ml       Medications:   Scheduled Meds:  ??? acetaminophen  650 mg Oral Q8H   ??? allopurinoL  200 mg Oral Daily   ??? amiodarone  200 mg Oral BID with meals   ??? apixaban  5 mg Oral BID   ??? atorvastatin  80 mg Oral Daily   ??? bumetanide  2 mg Oral BID   ??? cholecalciferol (vitamin D3 25 mcg (1,000 units))  25 mcg Oral Daily   ??? diclofenac sodium  2 g Topical QID   ??? ezetimibe  10 mg Oral Nightly   ??? FLUoxetine  80 mg Oral Daily   ??? gabapentin  100 mg Oral BID   ??? insulin glargine  35 Units Subcutaneous Nightly   ??? insulin lispro  1-20 Units Subcutaneous ACHS   ??? insulin lispro  17 Units Subcutaneous TID AC   ??? isosorbide mononitrate  60 mg Oral Tu,Th,Sa,Su   ??? metoprolol succinate  100 mg Oral Daily   ??? mirtazapine  7.5 mg Oral Nightly   ??? nicotine  1 patch Transdermal Daily   ??? pantoprazole  40 mg Oral Daily   ??? polyethylene glycol  17 g Oral BID   ??? predniSONE  60 mg Oral Daily   ??? ranolazine  500 mg Oral BID   ??? senna  2 tablet Oral BID   ??? sevelamer  2,400 mg Oral 3xd Meals   ??? ticagrelor  90 mg Oral BID   ??? valACYclovir  500 mg Oral Daily     Continuous Infusions:    D Linford Arnold MD  [facility].

## 2020-05-24 LAB — HEPATIC FUNCTION PANEL
ALBUMIN: 3.1 g/dL — ABNORMAL LOW (ref 3.4–5.0)
ALKALINE PHOSPHATASE: 88 U/L (ref 46–116)
ALT (SGPT): 9 U/L — ABNORMAL LOW (ref 10–49)
AST (SGOT): 14 U/L (ref <=34)
BILIRUBIN DIRECT: <0.1 mg/dL (ref 0.00–0.30)
BILIRUBIN TOTAL: 0.2 mg/dL — ABNORMAL LOW (ref 0.3–1.2)
PROTEIN TOTAL: 6.1 g/dL (ref 5.7–8.2)

## 2020-05-24 LAB — COMPREHENSIVE METABOLIC PANEL
ALBUMIN: 3.1 g/dL — ABNORMAL LOW (ref 3.4–5.0)
ALKALINE PHOSPHATASE: 86 U/L (ref 46–116)
ALT (SGPT): <7 U/L — ABNORMAL LOW (ref 10–49)
ANION GAP: 10 mmol/L (ref 5–14)
AST (SGOT): 12 U/L (ref <=34)
BILIRUBIN TOTAL: 0.2 mg/dL — ABNORMAL LOW (ref 0.3–1.2)
BLOOD UREA NITROGEN: 34 mg/dL — ABNORMAL HIGH (ref 9–23)
BUN / CREAT RATIO: 8
CALCIUM: 8.7 mg/dL (ref 8.7–10.4)
CHLORIDE: 101 mmol/L (ref 98–107)
CO2: 24 mmol/L (ref 20.0–31.0)
CREATININE: 4.5 mg/dL — ABNORMAL HIGH
EGFR CKD-EPI AA MALE: 16 mL/min/{1.73_m2} — ABNORMAL LOW (ref >=60)
EGFR CKD-EPI NON-AA MALE: 14 mL/min/{1.73_m2} — ABNORMAL LOW (ref >=60)
GLUCOSE RANDOM: 150 mg/dL (ref 70–179)
POTASSIUM: 4.7 mmol/L — ABNORMAL HIGH (ref 3.4–4.5)
PROTEIN TOTAL: 6.2 g/dL (ref 5.7–8.2)
SODIUM: 135 mmol/L (ref 135–145)

## 2020-05-24 LAB — CBC W/ AUTO DIFF
BASOPHILS ABSOLUTE COUNT: 0 10*9/L (ref 0.0–0.1)
BASOPHILS RELATIVE PERCENT: 0.2 %
EOSINOPHILS ABSOLUTE COUNT: 0.4 10*9/L (ref 0.0–0.4)
EOSINOPHILS RELATIVE PERCENT: 3.2 %
HEMATOCRIT: 36.6 % — ABNORMAL LOW (ref 41.0–53.0)
HEMOGLOBIN: 11.8 g/dL — ABNORMAL LOW (ref 13.5–17.5)
LARGE UNSTAINED CELLS: 1 % (ref 0–4)
LYMPHOCYTES ABSOLUTE COUNT: 1.6 10*9/L (ref 1.5–5.0)
LYMPHOCYTES RELATIVE PERCENT: 13.9 %
MEAN CORPUSCULAR HEMOGLOBIN CONC: 32.2 g/dL (ref 31.0–37.0)
MEAN CORPUSCULAR HEMOGLOBIN: 29.3 pg (ref 26.0–34.0)
MEAN CORPUSCULAR VOLUME: 91 fL (ref 80.0–100.0)
MEAN PLATELET VOLUME: 8.4 fL (ref 7.0–10.0)
MONOCYTES ABSOLUTE COUNT: 1 10*9/L — ABNORMAL HIGH (ref 0.2–0.8)
MONOCYTES RELATIVE PERCENT: 8.5 %
NEUTROPHILS ABSOLUTE COUNT: 8.5 10*9/L — ABNORMAL HIGH (ref 2.0–7.5)
NEUTROPHILS RELATIVE PERCENT: 72.9 %
PLATELET COUNT: 241 10*9/L (ref 150–440)
RED BLOOD CELL COUNT: 4.02 10*12/L — ABNORMAL LOW (ref 4.50–5.90)
RED CELL DISTRIBUTION WIDTH: 16.5 % — ABNORMAL HIGH (ref 12.0–15.0)
WBC ADJUSTED: 11.7 10*9/L — ABNORMAL HIGH (ref 4.5–11.0)

## 2020-05-24 LAB — HIGH SENSITIVITY TROPONIN I - 4 HOUR SERIAL
HIGH SENSITIVITY TROPONIN - DELTA (2-6H): 2 ng/L (ref <=7)
HIGH-SENSITIVITY TROPONIN I - 6 HOUR: 28 ng/L (ref <=53)

## 2020-05-24 LAB — HIGH SENSITIVITY TROPONIN I - 2H/6H SERIAL: HIGH-SENSITIVITY TROPONIN I - 2 HOUR: 26 ng/L (ref <=53)

## 2020-05-24 LAB — LACTATE, VENOUS, WHOLE BLOOD: LACTATE BLOOD VENOUS: 1.5 mmol/L (ref 0.5–1.8)

## 2020-05-24 LAB — PHOSPHORUS: PHOSPHORUS: 4.2 mg/dL (ref 2.4–5.1)

## 2020-05-24 LAB — LIPASE: LIPASE: 50 U/L (ref 12–53)

## 2020-05-24 MED ADMIN — methocarbamoL (ROBAXIN) tablet 500 mg: 500 mg | ORAL | @ 14:00:00

## 2020-05-24 MED ADMIN — insulin lispro (HumaLOG) injection 17 Units: 17 [IU] | SUBCUTANEOUS | @ 18:00:00

## 2020-05-24 MED ADMIN — senna (SENOKOT) tablet 2 tablet: 2 | ORAL | @ 13:00:00

## 2020-05-24 MED ADMIN — sevelamer (RENVELA) tablet 2,400 mg: 2400 mg | ORAL | @ 23:00:00

## 2020-05-24 MED ADMIN — nicotine (NICODERM CQ) 14 mg/24 hr patch 1 patch: 1 | TRANSDERMAL | @ 13:00:00

## 2020-05-24 MED ADMIN — bumetanide (BUMEX) tablet 2 mg: 2 mg | ORAL | @ 13:00:00

## 2020-05-24 MED ADMIN — isosorbide mononitrate (IMDUR) 24 hr tablet 60 mg: 60 mg | ORAL | @ 23:00:00

## 2020-05-24 MED ADMIN — gabapentin (NEURONTIN) capsule 100 mg: 100 mg | ORAL | @ 02:00:00

## 2020-05-24 MED ADMIN — ranolazine (RANEXA) 12 hr tablet 500 mg: 500 mg | ORAL | @ 02:00:00

## 2020-05-24 MED ADMIN — apixaban (ELIQUIS) tablet 5 mg: 5 mg | ORAL | @ 13:00:00

## 2020-05-24 MED ADMIN — mirtazapine (REMERON) tablet 7.5 mg: 7.5 mg | ORAL | @ 02:00:00

## 2020-05-24 MED ADMIN — sevelamer (RENVELA) tablet 2,400 mg: 2400 mg | ORAL | @ 13:00:00

## 2020-05-24 MED ADMIN — diclofenac sodium (VOLTAREN) 1 % gel 2 g: 2 g | TOPICAL | @ 10:00:00

## 2020-05-24 MED ADMIN — cholecalciferol (vitamin D3 25 mcg (1,000 units)) tablet 25 mcg: 25 ug | ORAL | @ 13:00:00

## 2020-05-24 MED ADMIN — apixaban (ELIQUIS) tablet 5 mg: 5 mg | ORAL | @ 02:00:00

## 2020-05-24 MED ADMIN — insulin glargine (LANTUS) injection 35 Units: 35 [IU] | SUBCUTANEOUS | @ 02:00:00

## 2020-05-24 MED ADMIN — oxyCODONE (ROXICODONE) immediate release tablet 5 mg: 5 mg | ORAL | @ 05:00:00 | Stop: 2020-05-30

## 2020-05-24 MED ADMIN — ticagrelor (BRILINTA) tablet 90 mg: 90 mg | ORAL | @ 13:00:00

## 2020-05-24 MED ADMIN — amiodarone (PACERONE) tablet 200 mg: 200 mg | ORAL | @ 23:00:00

## 2020-05-24 MED ADMIN — FLUoxetine (PROzac) capsule 80 mg: 80 mg | ORAL | @ 02:00:00

## 2020-05-24 MED ADMIN — amiodarone (PACERONE) tablet 200 mg: 200 mg | ORAL | @ 13:00:00

## 2020-05-24 MED ADMIN — allopurinoL (ZYLOPRIM) tablet 200 mg: 200 mg | ORAL | @ 13:00:00

## 2020-05-24 MED ADMIN — metoprolol succinate (TOPROL-XL) 24 hr tablet 100 mg: 100 mg | ORAL | @ 13:00:00

## 2020-05-24 MED ADMIN — oxyCODONE (ROXICODONE) immediate release tablet 5 mg: 5 mg | ORAL | @ 17:00:00 | Stop: 2020-05-30

## 2020-05-24 MED ADMIN — diclofenac sodium (VOLTAREN) 1 % gel 2 g: 2 g | TOPICAL | @ 23:00:00

## 2020-05-24 MED ADMIN — diclofenac sodium (VOLTAREN) 1 % gel 2 g: 2 g | TOPICAL | @ 17:00:00

## 2020-05-24 MED ADMIN — atorvastatin (LIPITOR) tablet 80 mg: 80 mg | ORAL | @ 13:00:00

## 2020-05-24 MED ADMIN — amiodarone (PACERONE) tablet 200 mg: 200 mg | ORAL | @ 02:00:00

## 2020-05-24 MED ADMIN — ranolazine (RANEXA) 12 hr tablet 500 mg: 500 mg | ORAL | @ 15:00:00

## 2020-05-24 MED ADMIN — acetaminophen (TYLENOL) tablet 650 mg: 650 mg | ORAL | @ 04:00:00

## 2020-05-24 MED ADMIN — bumetanide (BUMEX) tablet 2 mg: 2 mg | ORAL | @ 02:00:00

## 2020-05-24 MED ADMIN — sevelamer (RENVELA) tablet 2,400 mg: 2400 mg | ORAL | @ 17:00:00

## 2020-05-24 MED ADMIN — pantoprazole (PROTONIX) EC tablet 40 mg: 40 mg | ORAL | @ 13:00:00

## 2020-05-24 MED ADMIN — gabapentin (NEURONTIN) capsule 100 mg: 100 mg | ORAL | @ 13:00:00

## 2020-05-24 MED ADMIN — oxyCODONE (ROXICODONE) immediate release tablet 5 mg: 5 mg | ORAL | @ 10:00:00 | Stop: 2020-05-30

## 2020-05-24 MED ADMIN — ticagrelor (BRILINTA) tablet 90 mg: 90 mg | ORAL | @ 02:00:00

## 2020-05-24 MED ADMIN — acetaminophen (TYLENOL) tablet 650 mg: 650 mg | ORAL | @ 18:00:00

## 2020-05-24 MED ADMIN — insulin lispro (HumaLOG) injection 1-20 Units: 1-20 [IU] | SUBCUTANEOUS | @ 13:00:00

## 2020-05-24 MED ADMIN — ezetimibe (ZETIA) tablet 10 mg: 10 mg | ORAL | @ 02:00:00

## 2020-05-24 MED ADMIN — insulin lispro (HumaLOG) injection 1-20 Units: 1-20 [IU] | SUBCUTANEOUS | @ 19:00:00

## 2020-05-24 MED ADMIN — oxyCODONE (ROXICODONE) immediate release tablet 5 mg: 5 mg | ORAL | @ 21:00:00 | Stop: 2020-05-24

## 2020-05-24 MED ADMIN — acetaminophen (TYLENOL) tablet 650 mg: 650 mg | ORAL | @ 10:00:00

## 2020-05-24 NOTE — Unmapped (Signed)
Pt continues to have ribcage pain. Robaxin and volteran cream given with relief. He states he wished to go home and is excited to get better. WCTM.   Problem: Adult Inpatient Plan of Care  Goal: Plan of Care Review  Outcome: Ongoing - Unchanged  Goal: Patient-Specific Goal (Individualized)  Outcome: Ongoing - Unchanged  Goal: Absence of Hospital-Acquired Illness or Injury  Outcome: Ongoing - Unchanged  Intervention: Identify and Manage Fall Risk  Recent Flowsheet Documentation  Taken 05/23/2020 1240 by Kizzie Bane, RN  Safety Interventions: fall reduction program maintained  Intervention: Prevent Skin Injury  Recent Flowsheet Documentation  Taken 05/23/2020 1240 by Kizzie Bane, RN  Skin Protection: adhesive use limited  Intervention: Prevent and Manage VTE (Venous Thromboembolism) Risk  Recent Flowsheet Documentation  Taken 05/23/2020 1240 by Kizzie Bane, RN  Activity Management: activity adjusted per tolerance  VTE Prevention/Management: anticoagulant therapy  Goal: Optimal Comfort and Wellbeing  Outcome: Ongoing - Unchanged  Goal: Readiness for Transition of Care  Outcome: Ongoing - Unchanged  Goal: Rounds/Family Conference  Outcome: Ongoing - Unchanged

## 2020-05-24 NOTE — Unmapped (Signed)
[facility] Hospitalist Daily Progress Note     LOS: 8 days     Assessment/Plan:  Principal Problem:    Paresthesia of left upper extremity  Active Problems:    Coronary artery disease of native artery of native heart with stable angina pectoris (CMS-[name])    Obesity, Class III, BMI 40-49.9 (morbid obesity) (CMS-[name])    Osteoarthritis    Tobacco use disorder    HFrEF (heart failure with reduced ejection fraction) (CMS-[name])    Transient alteration of awareness    Thoracic back pain  Resolved Problems:    Chest pain    Weakness    Dysarthria         Left sided weakness:   See hospital course for detailed discussion. Evaluated by Neurology, who felt like exam was reassuring other than known left foot drop without other focal deficits. No further workup recommended per them other than physical therapy.   - Speech: Aspiration precautions, with regular diet.   - PT/OT: Recommending 5x low. Working on SNF placement.  ??  Facial palsy consistent with Bell's palsy:   Possibly associated with recent COVID-19 infection, though could be due to a number of etiologies. Exam unremarkable this morning.   - Completed prednisone 60 qd on 2/23  - Completed valacyclovir on 2/23??  Back pain   Presumably musculoskeletal in origin - he says it started when he was picking up something heavy with a twisting motion and that it runs across both of his shoulders. Very tender to light palpation.   - Tylenol q8h   - Voltaren gel   - lidocaine patch   - PT/OT,   - Encourage mobilization   ??  Left ankle pain:   After fall. X-rays on admission notable for diffuse soft tissue swelling but no acute osseous abnormalities.  - Treat conservatively   ??  Adrenal lesions   On CT abdomen and pelvis, noted to have bilateral macroscopic fat-containing heterogeneous adrenal lesions, with the right adrenal mass having significantly increased over time, now 3.3 cm versus 2 cm in 2015. Yearly the left adrenal gland now measures 2 cm, previously 1.5 cm. Given presence of macroscopic fat, adrenal myelolipoma remains present, but given interval growth presence of soft tissue components, shorter interval follow-up is suggested to ensure stability.   [  ] prior to DC, discuss with Radiology about appropriate imaging interval  ??  CAD s/p PCI (last 02/2020):   Significant disease, most recent cath 02/2020 with severe 2v disease. Often has angina with dialysis. Per notes, guarded prognosis, have discussed palliative care outpatient given ongoing symptoms.   - Continue home metoprolol, ticagrelor, atorvastatin, zetia, isosorbide (non-HD days) and ranexa  ??  Ecchymosis     He reported new bruising over shoulder, bilateral abdomen/sides. No falls or known trauma. (Yet, developed left ankle pain after a fall).  CBC, PT/INR, PTT, fibrinogen normal.  Hemoglobin has not been trending down since admission. Abdominal CT not suggestive of bleeding.  This is presumably evolving ecchymosis that likely developed after minor trauma while on Eliquis and antiplatelet agent. All lesions appear to be resolving, no new lesions have appeared.   - Follow clinically.   ??  Paroxysmal Afib:   HR acceptable, rhythm has been paced most of admission   - Continue home amiodarone, Eliquis and toprol XL  ??  HFrEF (20-25%, AICD in place):   - Toprol XL, statin, bumex (he reports taking every day but med rec says on non-HD days)  ??  DM2,  insulin dependent:   Home regimen 35 units nightly detemir and 15 units asprat TIC AC. Glycemic control sub optimal with steroids. Figuero see improvement off prednisone.   - Lantus 35 units qhs  - Lispro to 17u TID AC   - SSI Standard scale   - Per wife, needs to get his insulin in his arm, not abdomen. Nursing is aware     ??  ESRD on HD [name]:   Dialyzes through R tunneled catheter, typically [name].  Still makes some urine.   - Nephrology following  - Home sevalamer  ??  Anxiety:  - Fluoxetine  - Consider Palliative Care consult for significant issues coping with medical diagnoses (has been referred outpatient)  ??  Tobacco Abuse:   Chewing tobacco, around 0.5 cans weekly per family.   - Tobacco cessation counseling evaluated patient, appreciate their assistance  - Nicotine patch 14mg     - Nicotine gum 4mg  every hour PRN  ??  Resolved   Chest pain: Reported to ED. Given associated back pain and neuro findings, there was concern for dissection, but CTA was negative and troponins have been negative. ECG with no acute changes as well.    ??  Report of possible T1 abnormality on OSH imaging  During recent admission to [facility], initial concern for spinal pathology based on CT. Repeat CT reportedly normal by their read. He was unable to get an MRI b/c of his pacemaker leads that were incompatible with MRI.   T-spine images transferred from [facility] - reviewed with [facility] attending radiology - on further review of imaging (including prior CTA chest), no evidence of true pathology and is most consistent with an artifact.  Cardiology notes while an MRI could be done - this would be tricky, but possible, and would have to be programmed under direction of EP attending.   ??  Prior seizure like episodes without EEG correlate: During prior hospitalizations has had unresponsive episodes with normal vital signs with full workup including cardiac workup, central head imaging, neurology consult and EEG. 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  ??    DVT prophylaxis. Apixaban      Disposition: Floor, DC to SNF when able      Please page the Hospitalist [facility] pager at [phone] with questions.        Pending labs:   Pending Labs     Order Current Status    Urine Culture In process          Subjective:   Some pain around chest/flank from ecchymosis. He still can recall no trauma with these lesions. No new medical issues this morning.     Objective:   Physical Exam:    GEN: Supine in bed.   RESPIRATORY: Breathing pattern was normal and the chest moved symmetrically.  Lung sounds were normal and there were no adventitious sounds.    CARDIOVASCULAR: Heart rate and rhythm were normal.  S1 and S2 were normal and there were no extra sounds or murmurs.   ABDOMEN: The abdomen was obese in contour.  Bowel sounds were present.    Palpation detected no tenderness.   SKIN/HAIR/NAILS: 4-6cm ecchymotic lesions on lateral abdomen/flank, and chest.   NEUROLOGIC: Mental status was normal.  The patient was normally coordinated in the arms.   PSYCHIATRIC: The patient was oriented to person, place, time, and circumstance. Speech was normal. Mood and affect were normal.     Vital signs in last 24 hours:  Temp:  [36.4 ??C (  97.5 ??F)-36.5 ??C (97.7 ??F)] 36.4 ??C (97.5 ??F)  Heart Rate:  [70-71] 70  Resp:  [17-18] 17  BP: (129-146)/(74-83) 137/76  MAP (mmHg):  [92-102] 95  SpO2:  [95 %-100 %] 95 %    Intake/Output last 24 hours:    Intake/Output Summary (Last 24 hours) at 05/24/2020 1414  Last data filed at 05/24/2020 0600  Gross per 24 hour   Intake 1208 ml   Output 500 ml   Net 708 ml       Medications:   Scheduled Meds:  ??? acetaminophen  650 mg Oral Q8H   ??? allopurinoL  200 mg Oral Daily   ??? amiodarone  200 mg Oral BID with meals   ??? apixaban  5 mg Oral BID   ??? atorvastatin  80 mg Oral Daily   ??? bumetanide  2 mg Oral BID   ??? cholecalciferol (vitamin D3 25 mcg (1,000 units))  25 mcg Oral Daily   ??? diclofenac sodium  2 g Topical QID   ??? ezetimibe  10 mg Oral Nightly   ??? FLUoxetine  80 mg Oral Daily   ??? gabapentin  100 mg Oral BID   ??? insulin glargine  35 Units Subcutaneous Nightly   ??? insulin lispro  1-20 Units Subcutaneous ACHS   ??? insulin lispro  17 Units Subcutaneous TID AC   ??? isosorbide mononitrate  60 mg Oral Tu,Th,Sa,Su   ??? metoprolol succinate  100 mg Oral Daily   ??? mirtazapine  7.5 mg Oral Nightly   ??? nicotine  1 patch Transdermal Daily   ??? pantoprazole  40 mg Oral Daily   ??? polyethylene glycol  17 g Oral BID   ??? ranolazine  500 mg Oral BID   ??? senna  2 tablet Oral BID   ??? sevelamer  2,400 mg Oral 3xd Meals   ??? ticagrelor  90 mg Oral BID Continuous Infusions:    D Linford Arnold MD  [facility].

## 2020-05-24 NOTE — Unmapped (Addendum)
Pt resting in bed overnight. Received scheduled meds. Prn oxy x2 this shift for pain to ribs/back.    Problem: Adult Inpatient Plan of Care  Goal: Plan of Care Review  Outcome: Progressing  Goal: Patient-Specific Goal (Individualized)  Outcome: Progressing  Goal: Absence of Hospital-Acquired Illness or Injury  Outcome: Progressing  Intervention: Identify and Manage Fall Risk  Recent Flowsheet Documentation  Taken 05/23/2020 2000 by Dalbert Mayotte, RN  Safety Interventions:  ??? fall reduction program maintained  ??? lighting adjusted for tasks/safety  ??? low bed  Intervention: Prevent and Manage VTE (Venous Thromboembolism) Risk  Recent Flowsheet Documentation  Taken 05/23/2020 2000 by Dalbert Mayotte, RN  Activity Management:  ??? activity adjusted per tolerance  ??? sitting, edge of bed  Goal: Optimal Comfort and Wellbeing  Outcome: Progressing  Goal: Readiness for Transition of Care  Outcome: Progressing  Goal: Rounds/Family Conference  Outcome: Progressing     Problem: Self-Care Deficit  Goal: Improved Ability to Complete Activities of Daily Living  Outcome: Progressing     Problem: Fall Injury Risk  Goal: Absence of Fall and Fall-Related Injury  Outcome: Progressing  Intervention: Promote Injury-Free Environment  Recent Flowsheet Documentation  Taken 05/23/2020 2000 by Dalbert Mayotte, RN  Safety Interventions:  ??? fall reduction program maintained  ??? lighting adjusted for tasks/safety  ??? low bed     Problem: Diabetes Comorbidity  Goal: Blood Glucose Level Within Targeted Range  Outcome: Progressing     Problem: Pain Chronic (Persistent) (Comorbidity Management)  Goal: Acceptable Pain Control and Functional Ability  Outcome: Progressing     Problem: Noninvasive Ventilation Acute  Goal: Effective Unassisted Ventilation and Oxygenation  Outcome: Progressing     Problem: Device-Related Complication Risk (Hemodialysis)  Goal: Safe, Effective Therapy Delivery  Outcome: Progressing     Problem: Hemodynamic Instability (Hemodialysis)  Goal: Effective Tissue Perfusion  Outcome: Progressing     Problem: Infection (Hemodialysis)  Goal: Absence of Infection Signs and Symptoms  Outcome: Progressing     Problem: Skin Injury Risk Increased  Goal: Skin Health and Integrity  Outcome: Progressing

## 2020-05-25 LAB — RENAL FUNCTION PANEL
ALBUMIN: 2.7 g/dL — ABNORMAL LOW (ref 3.4–5.0)
ANION GAP: 10 mmol/L (ref 5–14)
BLOOD UREA NITROGEN: 56 mg/dL — ABNORMAL HIGH (ref 9–23)
BUN / CREAT RATIO: 10
CALCIUM: 8.5 mg/dL — ABNORMAL LOW (ref 8.7–10.4)
CHLORIDE: 101 mmol/L (ref 98–107)
CO2: 24 mmol/L (ref 20.0–31.0)
CREATININE: 5.72 mg/dL — ABNORMAL HIGH
EGFR CKD-EPI AA MALE: 12 mL/min/{1.73_m2} — ABNORMAL LOW (ref >=60)
EGFR CKD-EPI NON-AA MALE: 11 mL/min/{1.73_m2} — ABNORMAL LOW (ref >=60)
GLUCOSE RANDOM: 197 mg/dL — ABNORMAL HIGH (ref 70–179)
PHOSPHORUS: 6.3 mg/dL — ABNORMAL HIGH (ref 2.4–5.1)
POTASSIUM: 5.3 mmol/L — ABNORMAL HIGH (ref 3.4–4.5)
SODIUM: 135 mmol/L (ref 135–145)

## 2020-05-25 LAB — CBC
HEMATOCRIT: 34 % — ABNORMAL LOW (ref 41.0–53.0)
HEMOGLOBIN: 10.8 g/dL — ABNORMAL LOW (ref 13.5–17.5)
MEAN CORPUSCULAR HEMOGLOBIN CONC: 31.8 g/dL (ref 31.0–37.0)
MEAN CORPUSCULAR HEMOGLOBIN: 29.3 pg (ref 26.0–34.0)
MEAN CORPUSCULAR VOLUME: 92.1 fL (ref 80.0–100.0)
MEAN PLATELET VOLUME: 9.1 fL (ref 7.0–10.0)
PLATELET COUNT: 223 10*9/L (ref 150–440)
RED BLOOD CELL COUNT: 3.69 10*12/L — ABNORMAL LOW (ref 4.50–5.90)
RED CELL DISTRIBUTION WIDTH: 17 % — ABNORMAL HIGH (ref 12.0–15.0)
WBC ADJUSTED: 10.8 10*9/L (ref 4.5–11.0)

## 2020-05-25 MED ORDER — METHOCARBAMOL 500 MG TABLET
ORAL_TABLET | Freq: Three times a day (TID) | ORAL | 0 refills | 5.00000 days | PRN
Start: 2020-05-25 — End: 2020-05-30

## 2020-05-25 MED ORDER — POLYETHYLENE GLYCOL 3350 17 GRAM ORAL POWDER PACKET
PACK | Freq: Every day | ORAL | 0 refills | 60 days
Start: 2020-05-25 — End: 2020-06-24

## 2020-05-25 MED ORDER — LEVEMIR FLEXTOUCH U-100 INSULIN 100 UNIT/ML (3 ML) SUBCUTANEOUS PEN
Freq: Every evening | SUBCUTANEOUS | 0 refills | 30 days
Start: 2020-05-25 — End: 2020-06-24

## 2020-05-25 MED ADMIN — gabapentin (NEURONTIN) capsule 100 mg: 100 mg | ORAL | @ 03:00:00

## 2020-05-25 MED ADMIN — nicotine (NICODERM CQ) 14 mg/24 hr patch 1 patch: 1 | TRANSDERMAL | @ 17:00:00

## 2020-05-25 MED ADMIN — acetaminophen (TYLENOL) tablet 650 mg: 650 mg | ORAL | @ 03:00:00

## 2020-05-25 MED ADMIN — bumetanide (BUMEX) tablet 2 mg: 2 mg | ORAL | @ 17:00:00 | Stop: 2020-05-25

## 2020-05-25 MED ADMIN — FLUoxetine (PROzac) capsule 80 mg: 80 mg | ORAL | @ 03:00:00

## 2020-05-25 MED ADMIN — bumetanide (BUMEX) tablet 2 mg: 2 mg | ORAL | @ 03:00:00

## 2020-05-25 MED ADMIN — insulin lispro (HumaLOG) injection 17 Units: 17 [IU] | SUBCUTANEOUS | @ 11:00:00 | Stop: 2020-05-25

## 2020-05-25 MED ADMIN — albumin human 25 % bottle 25 g: 25 g | INTRAVENOUS | @ 15:00:00

## 2020-05-25 MED ADMIN — metoprolol succinate (TOPROL-XL) 24 hr tablet 100 mg: 100 mg | ORAL | @ 17:00:00

## 2020-05-25 MED ADMIN — ezetimibe (ZETIA) tablet 10 mg: 10 mg | ORAL | @ 03:00:00

## 2020-05-25 MED ADMIN — ranolazine (RANEXA) 12 hr tablet 500 mg: 500 mg | ORAL | @ 17:00:00

## 2020-05-25 MED ADMIN — gabapentin (NEURONTIN) capsule 100 mg: 100 mg | ORAL | @ 17:00:00

## 2020-05-25 MED ADMIN — ranolazine (RANEXA) 12 hr tablet 500 mg: 500 mg | ORAL | @ 03:00:00

## 2020-05-25 MED ADMIN — ticagrelor (BRILINTA) tablet 90 mg: 90 mg | ORAL | @ 17:00:00

## 2020-05-25 MED ADMIN — diclofenac sodium (VOLTAREN) 1 % gel 2 g: 2 g | TOPICAL | @ 22:00:00

## 2020-05-25 MED ADMIN — insulin lispro (HumaLOG) injection 17 Units: 17 [IU] | SUBCUTANEOUS | @ 01:00:00

## 2020-05-25 MED ADMIN — cholecalciferol (vitamin D3 25 mcg (1,000 units)) tablet 25 mcg: 25 ug | ORAL | @ 17:00:00

## 2020-05-25 MED ADMIN — diclofenac sodium (VOLTAREN) 1 % gel 2 g: 2 g | TOPICAL | @ 17:00:00

## 2020-05-25 MED ADMIN — senna (SENOKOT) tablet 2 tablet: 2 | ORAL | @ 17:00:00

## 2020-05-25 MED ADMIN — gentamicin-sodium citrate lock solution in NS: 2 mL | @ 15:00:00

## 2020-05-25 MED ADMIN — mirtazapine (REMERON) tablet 7.5 mg: 7.5 mg | ORAL | @ 03:00:00

## 2020-05-25 MED ADMIN — heparin (porcine) 1000 unit/mL injection 8,000 Units: 8000 [IU] | INTRAVENOUS | @ 13:00:00

## 2020-05-25 MED ADMIN — sevelamer (RENVELA) tablet 2,400 mg: 2400 mg | ORAL | @ 17:00:00

## 2020-05-25 MED ADMIN — atorvastatin (LIPITOR) tablet 80 mg: 80 mg | ORAL | @ 17:00:00

## 2020-05-25 MED ADMIN — acetaminophen (TYLENOL) tablet 650 mg: 650 mg | ORAL | @ 11:00:00

## 2020-05-25 MED ADMIN — apixaban (ELIQUIS) tablet 5 mg: 5 mg | ORAL | @ 17:00:00

## 2020-05-25 MED ADMIN — sevelamer (RENVELA) tablet 2,400 mg: 2400 mg | ORAL | @ 22:00:00

## 2020-05-25 MED ADMIN — apixaban (ELIQUIS) tablet 5 mg: 5 mg | ORAL | @ 03:00:00

## 2020-05-25 MED ADMIN — diclofenac sodium (VOLTAREN) 1 % gel 2 g: 2 g | TOPICAL | @ 11:00:00

## 2020-05-25 MED ADMIN — insulin lispro (HumaLOG) injection 1-20 Units: 1-20 [IU] | SUBCUTANEOUS | @ 22:00:00

## 2020-05-25 MED ADMIN — ticagrelor (BRILINTA) tablet 90 mg: 90 mg | ORAL | @ 04:00:00

## 2020-05-25 MED ADMIN — amiodarone (PACERONE) tablet 200 mg: 200 mg | ORAL | @ 22:00:00

## 2020-05-25 MED ADMIN — diclofenac sodium (VOLTAREN) 1 % gel 2 g: 2 g | TOPICAL | @ 03:00:00

## 2020-05-25 MED ADMIN — amiodarone (PACERONE) tablet 200 mg: 200 mg | ORAL | @ 17:00:00

## 2020-05-25 MED ADMIN — insulin glargine (LANTUS) injection 35 Units: 35 [IU] | SUBCUTANEOUS | @ 03:00:00

## 2020-05-25 MED ADMIN — pantoprazole (PROTONIX) EC tablet 40 mg: 40 mg | ORAL | @ 17:00:00

## 2020-05-25 MED ADMIN — allopurinoL (ZYLOPRIM) tablet 200 mg: 200 mg | ORAL | @ 17:00:00

## 2020-05-25 NOTE — Unmapped (Signed)
[facility] Nephrology Hemodialysis Procedure Note     05/25/2020    Jeremy Oliver was seen and examined on hemodialysis    CHIEF COMPLAINT: End Stage Renal Disease    INTERVAL HISTORY:   Reports to be nauseous since morning and weak. Was hypotensive at start of treatment     DIALYSIS TREATMENT DATA:  Estimated Dry Weight (kg): 155.5 kg (342 lb 13 oz)  Patient Goal Weight (kg): 1.6 kg (3 lb 8.4 oz)  Dialyzer: F-180 (98 mLs)  Dialysis Bath  Bath: 2 K+ / 2.5 Ca+  Dialysate Na (mEq/L): 137 mEq/L  Dialysate HCO3 (mEq/L): 35 mEq/L  Dialysate Total Buffer HCO3 (mEq/L): 35 mEq/L  Blood Flow Rate (mL/min): 400 mL/min  Dialysis Flow (mL/min): 800 mL/min    PHYSICAL EXAM:  Vitals:  Temp:  [36.4 ??C-36.8 ??C] 36.8 ??C  Heart Rate:  [64-81] 69  BP: (89-166)/(41-85) 98/41  MAP (mmHg):  [94-103] 98  Weights:  Pre-Treatment Weight (kg): 157.1 kg (346 lb 5.5 oz)    General: in no acute distress, currently dialyzing in a bed/stretcher  Pulmonary: clear to auscultation  Cardiovascular: regular rate and rhythm  Extremities: 1+ edema  Access: Right IJ tunneled catheter     LAB DATA:  Lab Results   Component Value Date    NA 135 05/25/2020    K 5.3 (H) 05/25/2020    CL 101 05/25/2020    CO2 24.0 05/25/2020    BUN 56 (H) 05/25/2020    CREATININE 5.72 (H) 05/25/2020    CALCIUM 8.5 (L) 05/25/2020    MG 1.8 05/06/2020    PHOS 6.3 (H) 05/25/2020    ALBUMIN 2.7 (L) 05/25/2020      Lab Results   Component Value Date    HCT 34.0 (L) 05/25/2020    WBC 10.8 05/25/2020        ASSESSMENT/PLAN:  End Stage Renal Disease on Intermittent Hemodialysis:  UF goal: 0.5-1L as tolerated  Continue diuretics on post dialysis days   Adjust medications for a GFR <10  Avoid nephrotoxic agents     Bone Mineral Metabolism:  Lab Results   Component Value Date    CALCIUM 8.5 (L) 05/25/2020    CALCIUM 8.7 05/24/2020    Lab Results   Component Value Date    ALBUMIN 2.7 (L) 05/25/2020    ALBUMIN 3.1 (L) 05/24/2020      Lab Results   Component Value Date    PHOS 6.3 (H) 05/25/2020 PHOS 4.2 05/24/2020    Lab Results   Component Value Date    PTH 403.4 (H) 07/25/2019    PTH 395.4 (H) 04/14/2019      Labs appropriate, no changes.    Anemia:   Lab Results   Component Value Date    HGB 10.8 (L) 05/25/2020    HGB 11.8 (L) 05/24/2020    HGB 12.9 (L) 05/23/2020    Iron Saturation (%)   Date Value Ref Range Status   07/25/2019 11 (L) 20 - 50 % Final      Lab Results   Component Value Date    FERRITIN 150.0 07/25/2019       Anemia labs appropriate, no changes. not on ESA    Payton Emerald, MD  [facility] Division of Nephrology & Hypertension

## 2020-05-25 NOTE — Unmapped (Signed)
HEMODIALYSIS NURSE PROCEDURE NOTE    Treatment Number:  3 Room/Station:  8 Procedure Date:  05/25/20   Total Treatment Time:  2,911 Min.    CONSENT:  Written consent was obtained prior to the procedure and is detailed in the medical record. Prior to the start of the procedure, a time out was taken and the identity of the patient was confirmed via name, medical record number and date of birth.     WEIGHTS:  Hemodialysis Pre-Treatment Weights     Date/Time Pre-Treatment Weight (kg) Estimated Dry Weight (kg) Patient Goal Weight (kg) Total Goal Weight (kg)    05/25/20 0718 157.1 kg (346 lb 5.5 oz) 155.5 kg (342 lb 13 oz) 1.6 kg (3 lb 8.4 oz) 2.15 kg (4 lb 11.8 oz)           Hemodialysis Post Treatment Weights     Date/Time Post-Treatment Weight (kg) Treatment Weight Change (kg)    05/25/20 0953 ???  utw, just called for rapid response team ???        Active Dialysis Orders (168h ago, onward)     Start     Ordered    05/08/2020 0700  Hemodialysis inpatient  Every Mon, Wed, Fri      Question Answer Comment   K+ 2 meq/L    Ca++ 2.5 meq/L    Bicarb 35 meq/L    Na+ 137 meq/L    Na+ Modeling none    Dialyzer F180NR    Dialysate Temperature (C) 36.5    BFR-As tolerated to a maximum of: Other (please specify) 375 ml/min   DFR 800 mL/min    Duration of treatment 4 Hr    Dry weight (kg) 156    Challenge dry weight (kg) no    Fluid removal (L) UF 0.5-1L    Tubing Adult = 142 ml    Access Site Dialysis Catheter    Access Site Location Right    Keep SBP >: 90        05/25/20 0942    05/17/20 1137  Hemodialysis inpatient  Every Mon, Wed, Fri,   Status:  Canceled      Question Answer Comment   K+ 2 meq/L    Ca++ 2.5 meq/L    Bicarb 35 meq/L    Na+ 137 meq/L    Na+ Modeling none    Dialyzer F180NR    Dialysate Temperature (C) 36.5    BFR-As tolerated to a maximum of: Other (please specify) 375 ml/min   DFR 800 mL/min    Duration of treatment 4 Hr    Dry weight (kg) 155.5    Challenge dry weight (kg) no    Fluid removal (L) to EDW    Tubing Adult = 142 ml    Access Site Dialysis Catheter    Access Site Location Right    Keep SBP >: 90        05/17/20 1136              ACCESS SITE:       Hemodialysis Catheter Right Subclavian 2.2 mL 2.3 mL (Active)   Site Assessment Clean;Dry;Intact 05/25/20 1020   Proximal Lumen Status / Patency Blood Return - Brisk 05/25/20 1020   Proximal Lumen Intervention Deaccessed 05/25/20 1020   Medial Lumen Status / Patency Blood Return - Brisk 05/25/20 1020   Medial Lumen Intervention Deaccessed 05/25/20 1020   Dressing Intervention No intervention needed 05/25/20 1020   Dressing Status      Clean;Dry;Intact/not  removed 05/25/20 1020   Verification by X-ray No 05/25/20 1020   Site Condition No complications 05/25/20 1020   Dressing Type CHG gel;Occlusive;Transparent 05/25/20 1020   Dressing Change Due 05/25/20 05/25/20 1020   Line Necessity Reviewed? Y 05/24/20 0800   Line Necessity Indications Yes - Hemodialysis 05/25/20 1020   Line Necessity Reviewed With nephrologist 05/25/20 1020           Catheter Fill Volumes:  Arterial:  2.2 mL Venous:  2.3 mL   Catheter filled with gentamycin sodium citrate post procedure.    Patient Lines/Drains/Airways Status     Active Peripheral & Central Intravenous Access     Name Placement date Placement time Site Days    Peripheral IV 2020/05/31 Right Antecubital 2020-05-31  1231  Antecubital  8              LAB RESULTS:  Lab Results   Component Value Date    NA 135 05/25/2020    K 5.3 (H) 05/25/2020    CL 101 05/25/2020    CO2 24.0 05/25/2020    BUN 56 (H) 05/25/2020    CREATININE 5.72 (H) 05/25/2020    GLU 197 (H) 05/25/2020    GLUF 223 (H) 06/09/2014    CALCIUM 8.5 (L) 05/25/2020    CAION 4.35 (L) 05/18/2020    PHOS 6.3 (H) 05/25/2020    MG 1.8 05/06/2020    PTH 403.4 (H) 07/25/2019    IRON 32 (L) 07/25/2019    LABIRON 11 (L) 07/25/2019    TRANSFERRIN 223.8 07/25/2019    FERRITIN 150.0 07/25/2019    TIBC 282.0 07/25/2019     Lab Results   Component Value Date    WBC 10.8 05/25/2020    HGB 10.8 (L) 05/25/2020    HCT 34.0 (L) 05/25/2020    PLT 223 05/25/2020    PHART 7.31 (L) 04/07/2017    PO2ART 124.0 (H) 04/07/2017    PCO2ART 44.3 04/07/2017    HCO3ART 22 04/07/2017    BEART -3.4 (L) 04/07/2017    O2SATART 98.9 04/07/2017    APTT 33.1 05/20/2020        VITAL SIGNS:    Hemodynamics     Date/Time Pulse BP MAP (mmHg) Patient Position    05/25/20 10:03:13 ??? 137/57 ??? ???    05/25/20 10:00:54 91 ??? ??? ???    05/25/20 09:53:08 99 134/88 ??? ???    05/25/20 0948 ??? 134/88 ??? ???    05/25/20 0945 ??? 134/88 ??? ???    05/25/20 0942 70 154/48 ??? ???    05/25/20 0928 69 98/41 ??? Lying    05/25/20 0915 69 131/74 ??? ???    05/25/20 0900 64 130/61 ??? Lying    05/25/20 0845 70 119/69 ??? Lying    05/25/20 0830 ??? 130/63 ??? ???    05/25/20 0822 69 98/51 ??? ???    05/25/20 0818 69 89/47 ??? ???    05/25/20 0756 69 151/76 ??? Lying    05/25/20 0727 70 134/68 98 Lying        Blood Volume Monitor     Date/Time Blood Volume Change (%) HCT HGB Critline O2 SAT %    05/25/20 0948 100 % 14 5.4 80.5    05/25/20 0945 51.6 % 21.7 7.4 70.2    05/25/20 0933 0.3 % 32.9 11.2 61.1    05/25/20 0928 -3.3 % 34 11.6 59.5    05/25/20 0915 -2.7 % 33.9 11.5 61.2    05/25/20 0900 -2.4 %  33.7 11.5 63.8    05/25/20 0845 -1.7 % 33.5 11.4 63.1    05/25/20 0830 1 % 32.6 11.1 61.6    05/25/20 0822 0.9 % 32.7 11.1 62.5    05/25/20 0818 -0.5 % 33.1 11.3 60.4        Oxygen Therapy     Date/Time Resp SpO2 O2 Device O2 Flow Rate (L/min)    05/25/20 10:00:54 ??? 99 %  2L [place] ??? ???    05/25/20 09:53:08 20 99 %  2L [place] ??? ???    05/25/20 0942 19 ??? ??? ???    05/25/20 0928 18 99 % Nasal cannula 3 L/min    05/25/20 0915 18 ??? ??? ???    05/25/20 0900 18 98 % Nasal cannula 3 L/min    05/25/20 0845 18 94 % Nasal cannula 3 L/min    05/25/20 0822 17 ??? ??? ???    05/25/20 0818 18 ??? ??? ???        Oxygen Connected to Wall:  yes    Pre-Hemodialysis Assessment     Date/Time Therapy Number Dialyzer All Machine Alarms Passed Air Detector Dialysis Flow (mL/min)    05/25/20 0718 3 F-180 (98 mLs) Yes Engaged 800 mL/min    Date/Time Verify Priming Solution Priming Volume Hemodialysis Independent pH Hemodialysis Machine Conductivity (mS/cm) Hemodialysis Independent Conductivity (mS/cm)    05/25/20 0718 0.9% NS 300 mL ???  NA ??? ???    Date/Time Bicarb Conductivity Residual Bleach Negative Free Chlorine Total Chlorine Chloramine    05/25/20 0718 -- Yes -- 0 --        Pre-Hemodialysis Treatment Comments     Date/Time Pre-Hemodialysis Comments    05/25/20 0718 Patient alert and oriented        Hemodialysis Treatment     Date/Time Blood Flow Rate (mL/min) Arterial Pressure (mmHg) Venous Pressure (mmHg) Transmembrane Pressure (mmHg)    05/25/20 0948 0 mL/min 23 mmHg 1 mmHg 43 mmHg    05/25/20 0945 0 mL/min 50 mmHg 4 mmHg 96 mmHg    05/25/20 0933 400 mL/min -150 mmHg 156 mmHg 31 mmHg    05/25/20 0928 400 mL/min -162 mmHg 162 mmHg 36 mmHg    05/25/20 0915 400 mL/min -158 mmHg 155 mmHg 43 mmHg    05/25/20 0900 400 mL/min -155 mmHg 153 mmHg 41 mmHg    05/25/20 0845 400 mL/min -153 mmHg 157 mmHg 38 mmHg    05/25/20 0830 375 mL/min -137 mmHg 140 mmHg 32 mmHg    05/25/20 0822 375 mL/min -139 mmHg 82 mmHg 42 mmHg    05/25/20 0818 375 mL/min -138 mmHg 146 mmHg 41 mmHg    05/25/20 0756 375 mL/min -130 mmHg 130 mmHg 50 mmHg    Date/Time Ultrafiltration Rate (mL/hr) Ultrafiltrate Removed (mL) Dialysate Flow Rate (mL/min) KECN (Kecn)    05/25/20 0948 0 mL/hr 719 mL 800 ml/min ???    05/25/20 0945 0 mL/hr 719 mL 800 ml/min ???    05/25/20 0933 0 mL/hr 718 mL 800 ml/min ???    05/25/20 0928 540 mL/hr 707 mL 800 ml/min ???    05/25/20 0915 540 mL/hr 590 mL 800 ml/min ???    05/25/20 0900 540 mL/hr 461 mL 800 ml/min ???    05/25/20 0845 540 mL/hr 319 mL 800 ml/min ???    05/25/20 0830 0 mL/hr 220 mL 800 ml/min ???    05/25/20 0822 0 mL/hr 220 mL 800 ml/min ???    05/25/20 0818 540 mL/hr 199 mL 800 ml/min ???  05/25/20 0756 ??? 0 mL ??? ???        Hemodialysis Treatment Comments     Date/Time Intra-Hemodialysis Comments    05/25/20 09:53:08 Called for rapid response.    05/25/20 0948 patient not responding even with sternal rub, blood rinseback, DR Hillery Hunter and Dr Bryson Corona at bedside. Patient became responsive, upper extremoties with tremors.    05/25/20 0942 remains with nausea, kept uf off, Dr Hillery Hunter rounding ang changing the EDW, uf 0.5 kg.    05/25/20 0933 remains with nausea, kept uf off    05/25/20 0928 complaints of not feeling well, nauseous, turned uf off, hypotensive, NS bolus given.    05/25/20 0900 alert, watching tv    05/25/20 0845 started on O2 inh patient verbalized having SOB.    05/25/20 0822 complaints of feeling tired, kept uf off    05/25/20 0818 turned uf off due to hypotension, not symptomatic    05/25/20 0756 HD treatment initiated.        Post Treatment     Date/Time Rinseback Volume (mL) On Line Clearance: spKt/V Total Liters Processed (L/min) Dialyzer Clearance    05/25/20 0953 300 mL ??? ??? Lightly streaked          Post Hemodialysis Treatment Comments     Date/Time Post-Hemodialysis Comments    05/25/20 0953 alert, stable        POST TREATMENT ASSESSMENT:  General appearance:  alert  Neurological:  Grossly normal  Lungs:  clear to auscultation bilaterally  Hearts:  S1 S2 regular  Hemodialysis I/O     Date/Time Total Hemodialysis Replacement Volume (mL) Total Ultrafiltrate Output (mL)    05/25/20 0953 331 mL 0 mL        1209-1209-01 - Medicaitons Given During Treatment  (last 3 hrs)         Vaniya Augspurger B Juleen Sorrels, RN       Medication Name Action Time Action Route Rate Dose User     albumin human 25 % bottle 25 g 05/25/20 0940 New Bag Intravenous  25 g Verdis Frederickson Bodi Palmeri, RN     gentamicin-sodium citrate lock solution in NS 05/25/20 1001 Given hemodialysis port injection  2 mL Verdis Frederickson Kirstan Fentress, RN     gentamicin-sodium citrate lock solution in NS 05/25/20 1000 Given hemodialysis port injection  2 mL Verdis Frederickson Nunzio Banet, RN                  Patient tolerated treatment in a  Bed.

## 2020-05-25 NOTE — Unmapped (Signed)
Pt following care plan without incident. Pt reports positive pain control. Pt was reported by the MD to have had a pseudo seizure while in dialysis. Pt discharge was discontinued because of the seizure. The pt and his wife appeared verbally upset and both were able to speak to the MD for clarification.      Problem: Adult Inpatient Plan of Care  Goal: Plan of Care Review  Outcome: Transitioned to Another Facility  Goal: Patient-Specific Goal (Individualized)  Outcome: Transitioned to Another Facility  Goal: Absence of Hospital-Acquired Illness or Injury  Outcome: Transitioned to Another Facility  Intervention: Identify and Manage Fall Risk  Recent Flowsheet Documentation  Taken 05/25/2020 1154 by Patrcia Dolly, RN  Safety Interventions:   low bed   fall reduction program maintained   nonskid shoes/slippers when out of bed  Intervention: Prevent and Manage VTE (Venous Thromboembolism) Risk  Recent Flowsheet Documentation  Taken 05/25/2020 1203 by Patrcia Dolly, RN  VTE Prevention/Management: anticoagulant therapy  Goal: Optimal Comfort and Wellbeing  Outcome: Transitioned to Another Facility  Goal: Readiness for Transition of Care  Outcome: Transitioned to Another Facility  Goal: Rounds/Family Conference  Outcome: Transitioned to Another Facility     Problem: Self-Care Deficit  Goal: Improved Ability to Complete Activities of Daily Living  Outcome: Transitioned to Another Facility  Intervention: Promote Activity and Functional Independence  Recent Flowsheet Documentation  Taken 05/25/2020 1203 by Patrcia Dolly, RN  Self-Care Promotion:   independence encouraged   BADL personal objects within reach     Problem: Fall Injury Risk  Goal: Absence of Fall and Fall-Related Injury  Outcome: Transitioned to Another Facility  Intervention: Identify and Manage Contributors  Recent Flowsheet Documentation  Taken 05/25/2020 1203 by Patrcia Dolly, RN  Self-Care Promotion:   independence encouraged   BADL personal objects within reach  Intervention: Promote Injury-Free Environment  Recent Flowsheet Documentation  Taken 05/25/2020 1154 by Patrcia Dolly, RN  Safety Interventions:   low bed   fall reduction program maintained   nonskid shoes/slippers when out of bed     Problem: Diabetes Comorbidity  Goal: Blood Glucose Level Within Targeted Range  Outcome: Transitioned to Another Facility     Problem: Pain Chronic (Persistent) (Comorbidity Management)  Goal: Acceptable Pain Control and Functional Ability  Outcome: Transitioned to Another Facility     Problem: Noninvasive Ventilation Acute  Goal: Effective Unassisted Ventilation and Oxygenation  Outcome: Transitioned to Another Facility     Problem: Device-Related Complication Risk (Hemodialysis)  Goal: Safe, Effective Therapy Delivery  Outcome: Transitioned to Another Facility     Problem: Hemodynamic Instability (Hemodialysis)  Goal: Effective Tissue Perfusion  Outcome: Transitioned to Another Facility     Problem: Infection (Hemodialysis)  Goal: Absence of Infection Signs and Symptoms  Outcome: Transitioned to Another Facility     Problem: Skin Injury Risk Increased  Goal: Skin Health and Integrity  Outcome: Transitioned to Another Facility  Intervention: Optimize Skin Protection  Recent Flowsheet Documentation  Taken 05/25/2020 1154 by Patrcia Dolly, RN  Pressure Reduction Techniques: frequent weight shift encouraged  Pressure Reduction Devices: pressure-redistributing mattress utilized     Problem: Pain Acute  Goal: Acceptable Pain Control and Functional Ability  Outcome: Ongoing - Unchanged     Problem: Fall Injury Risk  Goal: Absence of Fall and Fall-Related Injury  Outcome: Ongoing - Unchanged

## 2020-05-25 NOTE — Unmapped (Signed)
[facility] Hospitalist Daily Progress Note     LOS: 9 days     Assessment/Plan:  Principal Problem:    Paresthesia of left upper extremity  Active Problems:    Coronary artery disease of native artery of native heart with stable angina pectoris (CMS-[name])    Obesity, Class III, BMI 40-49.9 (morbid obesity) (CMS-[name])    Osteoarthritis    Tobacco use disorder    HFrEF (heart failure with reduced ejection fraction) (CMS-[name])    Transient alteration of awareness    Thoracic back pain  Resolved Problems:    Chest pain    Weakness    Dysarthria         Left sided weakness   See hospital course for detailed discussion. Evaluated by Neurology, who felt like exam was reassuring other than known left foot drop without other focal deficits. No further workup recommended per them other than physical therapy.   - Speech: Aspiration precautions, with regular diet.   - PT/OT: Recommending 5x low. Working on SNF placement.    Pseudoseizure  During prior hospitalizations has had unresponsive episodes with normal vital signs with full workup including cardiac workup, central head imaging, neurology consult and EEG. 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 Rapid response called. In retrospect Mr Fake says he was just sleeping.   - Follow clinically   ??    Back pain   Presumably musculoskeletal in origin - he says it started when he was picking up something heavy with a twisting motion and that it runs across both of his shoulders. Very tender to light palpation.   - Tylenol q8h   - Voltaren gel   - lidocaine patch   - PT/OT   - Encourage mobilization   ??  Left ankle pain:   After fall. X-rays on admission notable for diffuse soft tissue swelling but no acute osseous abnormalities.  - Treat conservatively   ??  Adrenal lesions   On CT abdomen and pelvis, noted to have bilateral macroscopic fat-containing heterogeneous adrenal lesions, with the right adrenal mass having significantly increased over time, now 3.3 cm versus 2 cm in 2015. Yearly the left adrenal gland now measures 2 cm, previously 1.5 cm. Given presence of macroscopic fat, adrenal myelolipoma remains present, but given interval growth presence of soft tissue components, shorter interval follow-up is suggested to ensure stability.   [  ] prior to DC, discuss with Radiology about appropriate imaging interval  ??  CAD s/p PCI (last 02/2020):   Significant disease, most recent cath 02/2020 with severe 2v disease. Often has angina with dialysis. Per notes, guarded prognosis, have discussed palliative care outpatient given ongoing symptoms.   - Continue home metoprolol, ticagrelor, atorvastatin, zetia, isosorbide (non-HD days) and ranexa  ??  Ecchymosis     He reported new bruising over shoulder, bilateral abdomen/sides. No falls or known trauma. (Yet, developed left ankle pain after a fall).  CBC, PT/INR, PTT, fibrinogen normal.  Hemoglobin has not been trending down since admission. Abdominal CT not suggestive of bleeding.  This is presumably evolving ecchymosis that likely developed after minor trauma while on Eliquis and antiplatelet agent. All lesions appear to be resolving, no new lesions have appeared.   - Follow clinically.   ??  Paroxysmal Afib   HR acceptable, rhythm has been paced most of admission   - Amiodarone, Eliquis and toprol XL  ??  HFrEF (20-25%, AICD in place)   - Toprol XL, statin,  bumex on non-HD days  ??  DM2, insulin dependent   Home regimen 35 units nightly detemir and 15 units asprat TIC AC. Glycemic control sub optimal with steroids. Appears to be requiring less insulin off prednisone.   - Lantus 35 units qhs  - Lispro 15u TID AC   - SSI Standard scale   - Per wife, needs to get his insulin in his arm, not abdomen. Nursing is aware       ESRD on HD [name]:   Dialyzes through R tunneled catheter, typically [name].  Still makes some urine.   - Nephrology following  - Home sevalamer  - Bumex on non dialysis days ??  Anxiety  - Fluoxetine  - Consider Palliative Care consult for significant issues coping with medical diagnoses (has been referred outpatient)  ??  Tobacco Abuse   Chewing tobacco, around 0.5 cans weekly per family.   - Tobacco cessation counseling evaluated patient, appreciate their assistance  - Nicotine patch 14mg     - Nicotine gum 4mg  every hour PRN  ??  Resolved   Chest pain: Reported to ED. Given associated back pain and neuro findings, there was concern for dissection, but CTA was negative and troponins have been negative. ECG with no acute changes as well.    ??  Report of possible T1 abnormality on OSH imaging  During recent admission to [facility], initial concern for spinal pathology based on CT. Repeat CT reportedly normal by their read. He was unable to get an MRI b/c of his pacemaker leads that were incompatible with MRI.   T-spine images transferred from [facility] - reviewed with [facility] attending radiology - on further review of imaging (including prior CTA chest), no evidence of true pathology and is most consistent with an artifact.  Cardiology notes while an MRI could be done - this would be tricky, but possible, and would have to be programmed under direction of EP attending.   ??  Facial palsy consistent with Bell's palsy:   Possibly associated with recent COVID-19 infection, though could be due to a number of etiologies. Exam unremarkable this morning.   - Completed prednisone 60 qd on 2/23  - Completed valacyclovir on 2/23??  ??    DVT prophylaxis. Apixaban      Disposition: Floor, DC to SNF when able      Please page the Hospitalist [facility] pager at [phone] with questions.        Pending labs:   Pending Labs     Order Current Status    Urine Culture In process          Subjective:   States he was not able to sleep well. 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 Dialysis run was aborted with over 2 hour of dialysis time remaining. Nephrology deemed he looked too poorly to consider discharge. Mr Licht is frustrated with discharge being cancelled, and left foot drop persistence.          Objective:   Physical Exam:    GEN: Supine in bed.   RESPIRATORY: Breathing pattern was normal and the chest moved symmetrically.  Lung sounds were normal and there were no adventitious sounds.    CARDIOVASCULAR: Heart rate and rhythm were normal.  S1 and S2 were normal and there were no extra sounds or murmurs.   ABDOMEN: The abdomen was obese in contour.  Bowel sounds were present.    Palpation detected no tenderness.   SKIN/HAIR/NAILS: 4-6cm ecchymotic lesions on lateral abdomen/flank, and chest.  All improving  NEUROLOGIC: Mental status was normal.  The patient was normally coordinated in the arms.   PSYCHIATRIC: The patient was oriented to person, place, time, and circumstance. Speech was normal. Mood and affect were normal.     Vital signs in last 24 hours:  Temp:  [36.4 ??C (97.5 ??F)-36.8 ??C (98.2 ??F)] 36.8 ??C (98.2 ??F)  Heart Rate:  [64-99] 70  Resp:  [14-20] 20  BP: (89-166)/(41-88) 121/68  MAP (mmHg):  [94-103] 98  SpO2:  [94 %-100 %] 99 %    Intake/Output last 24 hours:    Intake/Output Summary (Last 24 hours) at 05/25/2020 1139  Last data filed at 05/25/2020 0953  Gross per 24 hour   Intake 1031 ml   Output 0 ml   Net 1031 ml       Medications:   Scheduled Meds:  ??? acetaminophen  650 mg Oral Q8H   ??? allopurinoL  200 mg Oral Daily   ??? amiodarone  200 mg Oral BID with meals   ??? apixaban  5 mg Oral BID   ??? atorvastatin  80 mg Oral Daily   ??? bumetanide  2 mg Oral BID   ??? cholecalciferol (vitamin D3 25 mcg (1,000 units))  25 mcg Oral Daily   ??? diclofenac sodium  2 g Topical QID   ??? ezetimibe  10 mg Oral Nightly   ??? FLUoxetine  80 mg Oral Daily   ??? gabapentin  100 mg Oral BID   ??? insulin glargine  35 Units Subcutaneous Nightly   ??? insulin lispro  1-20 Units Subcutaneous ACHS   ??? insulin lispro  17 Units Subcutaneous TID AC   ??? isosorbide mononitrate  60 mg Oral Tu,Th,Sa,Su   ??? metoprolol succinate  100 mg Oral Daily   ??? mirtazapine  7.5 mg Oral Nightly   ??? nicotine  1 patch Transdermal Daily   ??? pantoprazole  40 mg Oral Daily   ??? polyethylene glycol  17 g Oral BID   ??? ranolazine  500 mg Oral BID   ??? senna  2 tablet Oral BID   ??? sevelamer  2,400 mg Oral 3xd Meals   ??? ticagrelor  90 mg Oral BID     Continuous Infusions:    D Linford Arnold MD  [facility] Service.

## 2020-05-25 NOTE — Unmapped (Addendum)
Patient resting in bed overnight, pain controlled with scheduled tylenol. Dialysis 7am today    Problem: Adult Inpatient Plan of Care  Goal: Plan of Care Review  Outcome: Progressing  Goal: Patient-Specific Goal (Individualized)  Outcome: Progressing  Goal: Absence of Hospital-Acquired Illness or Injury  Outcome: Progressing  Intervention: Identify and Manage Fall Risk  Recent Flowsheet Documentation  Taken 05/24/2020 2000 by Dalbert Mayotte, RN  Safety Interventions:  ??? commode/urinal/bedpan at bedside  ??? lighting adjusted for tasks/safety  ??? low bed  Intervention: Prevent and Manage VTE (Venous Thromboembolism) Risk  Recent Flowsheet Documentation  Taken 05/24/2020 2000 by Dalbert Mayotte, RN  Activity Management:  ??? activity adjusted per tolerance  ??? sitting, edge of bed  Goal: Optimal Comfort and Wellbeing  Outcome: Progressing  Goal: Readiness for Transition of Care  Outcome: Progressing  Goal: Rounds/Family Conference  Outcome: Progressing     Problem: Self-Care Deficit  Goal: Improved Ability to Complete Activities of Daily Living  Outcome: Progressing     Problem: Fall Injury Risk  Goal: Absence of Fall and Fall-Related Injury  Outcome: Progressing  Intervention: Promote Injury-Free Environment  Recent Flowsheet Documentation  Taken 05/24/2020 2000 by Dalbert Mayotte, RN  Safety Interventions:  ??? commode/urinal/bedpan at bedside  ??? lighting adjusted for tasks/safety  ??? low bed     Problem: Diabetes Comorbidity  Goal: Blood Glucose Level Within Targeted Range  Outcome: Progressing     Problem: Pain Chronic (Persistent) (Comorbidity Management)  Goal: Acceptable Pain Control and Functional Ability  Outcome: Progressing     Problem: Noninvasive Ventilation Acute  Goal: Effective Unassisted Ventilation and Oxygenation  Outcome: Progressing     Problem: Device-Related Complication Risk (Hemodialysis)  Goal: Safe, Effective Therapy Delivery  Outcome: Progressing     Problem: Hemodynamic Instability (Hemodialysis)  Goal: Effective Tissue Perfusion  Outcome: Progressing     Problem: Infection (Hemodialysis)  Goal: Absence of Infection Signs and Symptoms  Outcome: Progressing     Problem: Skin Injury Risk Increased  Goal: Skin Health and Integrity  Outcome: Progressing

## 2020-05-25 NOTE — Unmapped (Signed)
Pt discharge instructions were read to pt with highlighted passages . Pt able to read highlighted passages back to the nurse. Pt verbalized understanding of discharge instructions. Pt had dialysis this shift. Pt reports transitioning to another [facility] for rehab. Pt alert and oriented x4.     Problem: Adult Inpatient Plan of Care  Goal: Plan of Care Review  Outcome: Transitioned to Another Facility  Goal: Patient-Specific Goal (Individualized)  Outcome: Transitioned to Another Facility  Goal: Absence of Hospital-Acquired Illness or Injury  Outcome: Transitioned to Another Facility  Intervention: Identify and Manage Fall Risk  Recent Flowsheet Documentation  Taken 05/25/2020 1154 by Patrcia Dolly, RN  Safety Interventions:   low bed   fall reduction program maintained   nonskid shoes/slippers when out of bed  Intervention: Prevent and Manage VTE (Venous Thromboembolism) Risk  Recent Flowsheet Documentation  Taken 05/25/2020 1203 by Patrcia Dolly, RN  VTE Prevention/Management: anticoagulant therapy  Goal: Optimal Comfort and Wellbeing  Outcome: Transitioned to Another Facility  Goal: Readiness for Transition of Care  Outcome: Transitioned to Another Facility  Goal: Rounds/Family Conference  Outcome: Transitioned to Another Facility     Problem: Self-Care Deficit  Goal: Improved Ability to Complete Activities of Daily Living  Outcome: Transitioned to Another Facility  Intervention: Promote Activity and Functional Independence  Recent Flowsheet Documentation  Taken 05/25/2020 1203 by Patrcia Dolly, RN  Self-Care Promotion:   independence encouraged   BADL personal objects within reach     Problem: Fall Injury Risk  Goal: Absence of Fall and Fall-Related Injury  Outcome: Transitioned to Another Facility  Intervention: Identify and Manage Contributors  Recent Flowsheet Documentation  Taken 05/25/2020 1203 by Patrcia Dolly, RN  Self-Care Promotion:   independence encouraged   BADL personal objects within reach  Intervention: Promote Injury-Free Environment  Recent Flowsheet Documentation  Taken 05/25/2020 1154 by Patrcia Dolly, RN  Safety Interventions:   low bed   fall reduction program maintained   nonskid shoes/slippers when out of bed     Problem: Diabetes Comorbidity  Goal: Blood Glucose Level Within Targeted Range  Outcome: Transitioned to Another Facility     Problem: Pain Chronic (Persistent) (Comorbidity Management)  Goal: Acceptable Pain Control and Functional Ability  Outcome: Transitioned to Another Facility     Problem: Noninvasive Ventilation Acute  Goal: Effective Unassisted Ventilation and Oxygenation  Outcome: Transitioned to Another Facility     Problem: Device-Related Complication Risk (Hemodialysis)  Goal: Safe, Effective Therapy Delivery  Outcome: Transitioned to Another Facility     Problem: Hemodynamic Instability (Hemodialysis)  Goal: Effective Tissue Perfusion  Outcome: Transitioned to Another Facility     Problem: Infection (Hemodialysis)  Goal: Absence of Infection Signs and Symptoms  Outcome: Transitioned to Another Facility     Problem: Skin Injury Risk Increased  Goal: Skin Health and Integrity  Outcome: Transitioned to Another Facility  Intervention: Optimize Skin Protection  Recent Flowsheet Documentation  Taken 05/25/2020 1154 by Patrcia Dolly, RN  Pressure Reduction Techniques: frequent weight shift encouraged  Pressure Reduction Devices: pressure-redistributing mattress utilized

## 2020-05-25 NOTE — Unmapped (Signed)
Problem: Adult Inpatient Plan of Care  Goal: Plan of Care Review  Outcome: Progressing  Note: Patient's vital signs are stable. Difficulty managing abd pain with scheduled and prn meds. No neurological changes noted. All side rails up, wheels locked, and call bell within reach. Tolerating diet, appetite good. Had a BM today, passing gas.  Dr. Pricilla Riffle made aware of increased abd pain, in to examine pt., orders placed.  Will continue to monitor.    Goal: Patient-Specific Goal (Individualized)  Outcome: Progressing  Flowsheets (Taken 05/24/2020 1753)  Patient-Specific Goals (Include Timeframe): Pt will have pain levels < or = 3/10 starting 2/25.  Goal: Absence of Hospital-Acquired Illness or Injury  Outcome: Progressing  Intervention: Identify and Manage Fall Risk  Recent Flowsheet Documentation  Taken 05/24/2020 0800 by Willeen Niece, RN  Safety Interventions:   fall reduction program maintained   low bed  Intervention: Prevent Skin Injury  Recent Flowsheet Documentation  Taken 05/24/2020 1600 by Willeen Niece, RN  Skin Protection: incontinence pads utilized  Taken 05/24/2020 1400 by Willeen Niece, RN  Skin Protection: incontinence pads utilized  Taken 05/24/2020 1200 by Willeen Niece, RN  Skin Protection: incontinence pads utilized  Taken 05/24/2020 1000 by Willeen Niece, RN  Skin Protection: incontinence pads utilized  Taken 05/24/2020 0800 by Willeen Niece, RN  Skin Protection: incontinence pads utilized  Goal: Optimal Comfort and Wellbeing  Outcome: Progressing  Goal: Readiness for Transition of Care  Outcome: Progressing  Goal: Rounds/Family Conference  Outcome: Progressing     Problem: Self-Care Deficit  Goal: Improved Ability to Complete Activities of Daily Living  Outcome: Progressing     Problem: Fall Injury Risk  Goal: Absence of Fall and Fall-Related Injury  Outcome: Progressing  Intervention: Promote Injury-Free Environment  Recent Flowsheet Documentation  Taken 05/24/2020 0800 by Willeen Niece, RN  Safety Interventions:   fall reduction program maintained   low bed     Problem: Diabetes Comorbidity  Goal: Blood Glucose Level Within Targeted Range  Outcome: Progressing     Problem: Pain Chronic (Persistent) (Comorbidity Management)  Goal: Acceptable Pain Control and Functional Ability  Outcome: Progressing     Problem: Noninvasive Ventilation Acute  Goal: Effective Unassisted Ventilation and Oxygenation  Outcome: Progressing     Problem: Device-Related Complication Risk (Hemodialysis)  Goal: Safe, Effective Therapy Delivery  Outcome: Progressing     Problem: Hemodynamic Instability (Hemodialysis)  Goal: Effective Tissue Perfusion  Outcome: Progressing     Problem: Infection (Hemodialysis)  Goal: Absence of Infection Signs and Symptoms  Outcome: Progressing     Problem: Skin Injury Risk Increased  Goal: Skin Health and Integrity  Outcome: Progressing  Intervention: Optimize Skin Protection  Recent Flowsheet Documentation  Taken 05/24/2020 1600 by Willeen Niece, RN  Pressure Reduction Techniques: frequent weight shift encouraged  Pressure Reduction Devices: pressure-redistributing mattress utilized  Skin Protection: incontinence pads utilized  Taken 05/24/2020 1400 by Willeen Niece, RN  Pressure Reduction Techniques: frequent weight shift encouraged  Pressure Reduction Devices: pressure-redistributing mattress utilized  Skin Protection: incontinence pads utilized  Taken 05/24/2020 1200 by Willeen Niece, RN  Pressure Reduction Techniques: frequent weight shift encouraged  Pressure Reduction Devices: pressure-redistributing mattress utilized  Skin Protection: incontinence pads utilized  Taken 05/24/2020 1000 by Willeen Niece, RN  Pressure Reduction Techniques: frequent weight shift encouraged  Pressure Reduction Devices: pressure-redistributing mattress utilized  Skin Protection: incontinence pads utilized  Taken 05/24/2020 0800 by Willeen Niece, RN  Pressure Reduction Techniques:  frequent weight shift encouraged  Pressure Reduction Devices: pressure-redistributing mattress utilized  Skin Protection: incontinence pads utilized

## 2020-05-26 LAB — BASIC METABOLIC PANEL
ANION GAP: 6 mmol/L (ref 5–14)
BLOOD UREA NITROGEN: 47 mg/dL — ABNORMAL HIGH (ref 9–23)
BUN / CREAT RATIO: 8
CALCIUM: 8.9 mg/dL (ref 8.7–10.4)
CHLORIDE: 103 mmol/L (ref 98–107)
CO2: 26 mmol/L (ref 20.0–31.0)
CREATININE: 5.57 mg/dL — ABNORMAL HIGH
EGFR CKD-EPI AA MALE: 13 mL/min/{1.73_m2} — ABNORMAL LOW (ref >=60)
EGFR CKD-EPI NON-AA MALE: 11 mL/min/{1.73_m2} — ABNORMAL LOW (ref >=60)
GLUCOSE RANDOM: 125 mg/dL (ref 70–179)
POTASSIUM: 5.6 mmol/L — ABNORMAL HIGH (ref 3.4–4.5)
SODIUM: 135 mmol/L (ref 135–145)

## 2020-05-26 MED ADMIN — metoprolol succinate (TOPROL-XL) 24 hr tablet 100 mg: 100 mg | ORAL | @ 14:00:00

## 2020-05-26 MED ADMIN — mirtazapine (REMERON) tablet 7.5 mg: 7.5 mg | ORAL | @ 02:00:00

## 2020-05-26 MED ADMIN — cholecalciferol (vitamin D3 25 mcg (1,000 units)) tablet 25 mcg: 25 ug | ORAL | @ 14:00:00

## 2020-05-26 MED ADMIN — insulin glargine (LANTUS) injection 35 Units: 35 [IU] | SUBCUTANEOUS | @ 02:00:00

## 2020-05-26 MED ADMIN — acetaminophen (TYLENOL) tablet 650 mg: 650 mg | ORAL | @ 02:00:00

## 2020-05-26 MED ADMIN — methocarbamoL (ROBAXIN) tablet 500 mg: 500 mg | ORAL | @ 14:00:00

## 2020-05-26 MED ADMIN — ticagrelor (BRILINTA) tablet 90 mg: 90 mg | ORAL | @ 14:00:00

## 2020-05-26 MED ADMIN — insulin lispro (HumaLOG) injection 15 Units: 15 [IU] | SUBCUTANEOUS | @ 17:00:00

## 2020-05-26 MED ADMIN — insulin lispro (HumaLOG) injection 1-20 Units: 1-20 [IU] | SUBCUTANEOUS | @ 23:00:00

## 2020-05-26 MED ADMIN — gabapentin (NEURONTIN) capsule 100 mg: 100 mg | ORAL | @ 14:00:00

## 2020-05-26 MED ADMIN — insulin lispro (HumaLOG) injection 1-20 Units: 1-20 [IU] | SUBCUTANEOUS | @ 17:00:00

## 2020-05-26 MED ADMIN — diclofenac sodium (VOLTAREN) 1 % gel 2 g: 2 g | TOPICAL | @ 11:00:00

## 2020-05-26 MED ADMIN — oxyCODONE (ROXICODONE) immediate release tablet 5 mg: 5 mg | ORAL | Stop: 2020-05-30

## 2020-05-26 MED ADMIN — gabapentin (NEURONTIN) capsule 100 mg: 100 mg | ORAL | @ 02:00:00

## 2020-05-26 MED ADMIN — insulin lispro (HumaLOG) injection 15 Units: 15 [IU] | SUBCUTANEOUS | @ 23:00:00

## 2020-05-26 MED ADMIN — methocarbamoL (ROBAXIN) tablet 500 mg: 500 mg | ORAL | @ 23:00:00

## 2020-05-26 MED ADMIN — sevelamer (RENVELA) tablet 2,400 mg: 2400 mg | ORAL | @ 14:00:00

## 2020-05-26 MED ADMIN — sevelamer (RENVELA) tablet 2,400 mg: 2400 mg | ORAL | @ 23:00:00

## 2020-05-26 MED ADMIN — sevelamer (RENVELA) tablet 2,400 mg: 2400 mg | ORAL | @ 17:00:00

## 2020-05-26 MED ADMIN — apixaban (ELIQUIS) tablet 5 mg: 5 mg | ORAL | @ 02:00:00

## 2020-05-26 MED ADMIN — acetaminophen (TYLENOL) tablet 650 mg: 650 mg | ORAL | @ 11:00:00

## 2020-05-26 MED ADMIN — amiodarone (PACERONE) tablet 200 mg: 200 mg | ORAL | @ 23:00:00

## 2020-05-26 MED ADMIN — nicotine (NICODERM CQ) 14 mg/24 hr patch 1 patch: 1 | TRANSDERMAL | @ 14:00:00

## 2020-05-26 MED ADMIN — ranolazine (RANEXA) 12 hr tablet 500 mg: 500 mg | ORAL | @ 14:00:00

## 2020-05-26 MED ADMIN — bumetanide (BUMEX) tablet 2 mg: 2 mg | ORAL | @ 23:00:00

## 2020-05-26 MED ADMIN — insulin lispro (HumaLOG) injection 15 Units: 15 [IU] | SUBCUTANEOUS | @ 16:00:00

## 2020-05-26 MED ADMIN — ranolazine (RANEXA) 12 hr tablet 500 mg: 500 mg | ORAL | @ 03:00:00

## 2020-05-26 MED ADMIN — oxyCODONE (ROXICODONE) immediate release tablet 5 mg: 5 mg | ORAL | @ 18:00:00 | Stop: 2020-05-30

## 2020-05-26 MED ADMIN — isosorbide mononitrate (IMDUR) 24 hr tablet 60 mg: 60 mg | ORAL | @ 23:00:00

## 2020-05-26 MED ADMIN — allopurinoL (ZYLOPRIM) tablet 200 mg: 200 mg | ORAL | @ 14:00:00

## 2020-05-26 MED ADMIN — amiodarone (PACERONE) tablet 200 mg: 200 mg | ORAL | @ 14:00:00

## 2020-05-26 MED ADMIN — pantoprazole (PROTONIX) EC tablet 40 mg: 40 mg | ORAL | @ 14:00:00

## 2020-05-26 MED ADMIN — oxyCODONE (ROXICODONE) immediate release tablet 5 mg: 5 mg | ORAL | @ 11:00:00 | Stop: 2020-05-30

## 2020-05-26 MED ADMIN — acetaminophen (TYLENOL) tablet 650 mg: 650 mg | ORAL | @ 19:00:00

## 2020-05-26 MED ADMIN — atorvastatin (LIPITOR) tablet 80 mg: 80 mg | ORAL | @ 14:00:00

## 2020-05-26 MED ADMIN — apixaban (ELIQUIS) tablet 5 mg: 5 mg | ORAL | @ 14:00:00

## 2020-05-26 MED ADMIN — ticagrelor (BRILINTA) tablet 90 mg: 90 mg | ORAL | @ 02:00:00

## 2020-05-26 MED ADMIN — FLUoxetine (PROzac) capsule 80 mg: 80 mg | ORAL | @ 02:00:00

## 2020-05-26 MED ADMIN — ezetimibe (ZETIA) tablet 10 mg: 10 mg | ORAL | @ 02:00:00

## 2020-05-26 NOTE — Unmapped (Signed)
Hemodialysis treatment for 4 hours; uf goal 0.5kg; monitor labs, weight, signs and symptoms of catheter infection.

## 2020-05-26 NOTE — Unmapped (Signed)
Problem: Adult Inpatient Plan of Care  Goal: Plan of Care Review  Outcome: Progressing  Goal: Patient-Specific Goal (Individualized)  Outcome: Progressing  Goal: Absence of Hospital-Acquired Illness or Injury  Outcome: Progressing  Intervention: Identify and Manage Fall Risk  Recent Flowsheet Documentation  Taken 05/25/2020 2117 by Alleen Borne, RN  Safety Interventions:   aspiration precautions   fall reduction program maintained  Goal: Optimal Comfort and Wellbeing  Outcome: Progressing  Goal: Readiness for Transition of Care  Outcome: Progressing  Goal: Rounds/Family Conference  Outcome: Progressing     Problem: Pain Acute  Goal: Acceptable Pain Control and Functional Ability  Outcome: Progressing     Problem: Fall Injury Risk  Goal: Absence of Fall and Fall-Related Injury  Outcome: Progressing  Intervention: Promote Injury-Free Environment  Recent Flowsheet Documentation  Taken 05/25/2020 2117 by Alleen Borne, RN  Safety Interventions:   aspiration precautions   fall reduction program maintained     Problem: Device-Related Complication Risk (Hemodialysis)  Goal: Safe, Effective Therapy Delivery  Outcome: Progressing     Problem: Hemodynamic Instability (Hemodialysis)  Goal: Effective Tissue Perfusion  Outcome: Progressing     Problem: Infection (Hemodialysis)  Goal: Absence of Infection Signs and Symptoms  Outcome: Progressing

## 2020-05-26 NOTE — Unmapped (Signed)
[facility] Hospitalist Daily Progress Note     LOS: 10 days     Assessment/Plan:  Principal Problem:    Paresthesia of left upper extremity  Active Problems:    Coronary artery disease of native artery of native heart with stable angina pectoris (CMS-[name])    Obesity, Class III, BMI 40-49.9 (morbid obesity) (CMS-[name])    Osteoarthritis    Tobacco use disorder    HFrEF (heart failure with reduced ejection fraction) (CMS-[name])    Transient alteration of awareness    Thoracic back pain  Resolved Problems:    Chest pain    Weakness    Dysarthria         Left sided weakness   See hospital course for detailed discussion. Evaluated by Neurology, who felt like exam was reassuring other than known left foot drop without other focal deficits. No further workup recommended per them other than physical therapy.   - Speech: Aspiration precautions, with regular diet.   - PT/OT: Recommending 5x low. 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 Additonal event during dialysis 2/25. Rapid response called. In retrospect, Jeremy Oliver reported he was just sleeping.   - Follow clinically     Back pain   Presumably musculoskeletal in origin - he says it started when he was picking up something heavy with a twisting motion and that it runs across both of his shoulders. Very tender to light palpation.   - Tylenol q8h   - Voltaren gel   - lidocaine patch   - PT/OT   - Encourage mobilization   ??  Left ankle pain:   After fall. X-rays on admission notable for diffuse soft tissue swelling but no acute osseous abnormalities.  - Treat conservatively   ??  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 Radiology recommends follow up imaging in 1 year.  ??  CAD s/p PCI (last 02/2020):   Significant disease, most recent cath 02/2020 with severe 2v disease. Often has angina with dialysis. Per notes, guarded prognosis, have discussed palliative care outpatient given ongoing symptoms.   - Continue home metoprolol, ticagrelor, atorvastatin, zetia, isosorbide (non-HD days) and ranexa  ??  Ecchymosis : noted over shoulder, bilateral abdomen/sides. CBC, PT/INR, PTT, fibrinogen normal and Hgb stable through admission. Presumably evolving ecchymosis that likely developed after minor trauma while on Eliquis and antiplatelet agent.  - Follow clinically.   ??  Paroxysmal Afib   HR acceptable, rhythm has been paced most of admission   - Amiodarone, Eliquis and toprol XL  ??  HFrEF (20-25%, AICD in place)   - Toprol XL, statin, bumex on non-HD days  ??  DM2, insulin dependent   Home regimen 35 units nightly detemir and 15 units asprat TIC AC. Glycemic control sub optimal with steroids. Appears to be requiring less insulin off prednisone.   - Lantus 35 units qhs  - Lispro 15u TID AC   - SSI Standard scale   - Per wife, needs to get his insulin in his arm, not abdomen. Nursing is aware     ESRD on HD [name]:   Dialyzes through R tunneled catheter, typically [name].  Still makes some  urine.   - Nephrology following  - Home sevalamer  - Bumex on non dialysis days     Hyperkalemia: within his range of fluctuation currently. Will CTM. HD as above.  ??  Anxiety  - Fluoxetine  - Consider Palliative Care consult for significant issues coping with medical diagnoses (has been referred outpatient)  ??  Tobacco Abuse   Chewing tobacco, around 0.5 cans weekly per family.   - Tobacco cessation counseling evaluated patient, appreciate their assistance  - Nicotine patch 14mg     - Nicotine gum 4mg  every hour PRN  ??  Resolved   Chest pain: Reported to ED. Given associated back pain and neuro findings, there was concern for dissection, but CTA was negative and troponins have been negative. ECG with no acute changes as well.    ??  Report of possible T1 abnormality on OSH imaging  During recent admission to [facility], initial concern for spinal pathology based on CT. Repeat CT reportedly normal by their read. He was unable to get an MRI b/c of his pacemaker leads that were incompatible with MRI.   T-spine images transferred from [facility] - reviewed with [facility] attending radiology - on further review of imaging (including prior CTA chest), no evidence of true pathology and is most consistent with an artifact.  Cardiology notes while an MRI could be done - this would be tricky, but possible, and would have to be programmed under direction of EP attending.   ??  Facial palsy consistent with Bell's palsy:   Possibly associated with recent COVID-19 infection, though could be due to a number of etiologies. Exam unremarkable this morning.   - Completed prednisone 60 qd on 2/23  - Completed valacyclovir on 2/23??  ??    DVT prophylaxis. Apixaban      Disposition: Floor, DC to SNF when able      Please page the Hospitalist [facility] pager at [phone] with questions.    I personally spent over half of a total  minutes face to face with the patient in counseling and discussion and/or coordination of care as described above, including discussion with nephrology, bedside nursing, patient, case management. [name], MD          Pending labs:   Pending Labs     Order Current Status    Urine Culture In process          Subjective:   Having anxiety this morning because he has not been able to get a hold of his wife on the phone. States that he sent a friend to go check on her. He is disappointed that he was not discharged yesterday after his event in HD. Discussed with nephrology this morning and they do not plan to dialyze him again until Monday.          Objective:   Physical Exam:    GEN: Supine in bed.   HEENT: normal appearing nose, MMM  RESPIRATORY: Breathing pattern was normal and the chest moved symmetrically.  Lung sounds were normal and there were no adventitious sounds.    CARDIOVASCULAR: Heart rate and rhythm were normal.  S1 and S2 were normal and there were no extra sounds or murmurs.   ABDOMEN: The abdomen was obese in contour.  Bowel sounds were present.    Palpation detected no tenderness.   SKIN/HAIR/NAILS: ecchymotic lesions on lateral abdomen/flank, back, and chest.  NEUROLOGIC: Mental status was normal.  The patient was normally coordinated in the arms.  PSYCHIATRIC: The patient was oriented to person, place, time, and circumstance. Speech was normal. Mood and affect were normal.     Vital signs in last 24 hours:  Temp:  [36.5 ??C (97.7 ??F)-36.9 ??C (98.4 ??F)] 36.5 ??C (97.7 ??F)  Heart Rate:  [70-75] 70  Resp:  [17-21] 17  BP: (136-167)/(77-83) 136/80  MAP (mmHg):  [96-108] 96  SpO2:  [94 %-98 %] 96 %    Intake/Output last 24 hours:    Intake/Output Summary (Last 24 hours) at 05/26/2020 1245  Last data filed at 05/26/2020 0831  Gross per 24 hour   Intake 240 ml   Output 780 ml   Net -540 ml       Medications:   Scheduled Meds:  ??? acetaminophen  650 mg Oral Q8H   ??? allopurinoL  200 mg Oral Daily   ??? amiodarone  200 mg Oral BID with meals   ??? apixaban  5 mg Oral BID   ??? atorvastatin  80 mg Oral Daily   ??? bumetanide  2 mg Oral Tu,Th,Sa,Su   ??? cholecalciferol (vitamin D3 25 mcg (1,000 units))  25 mcg Oral Daily   ??? diclofenac sodium  2 g Topical QID   ??? ezetimibe  10 mg Oral Nightly   ??? FLUoxetine  80 mg Oral Daily   ??? gabapentin  100 mg Oral BID   ??? insulin glargine  35 Units Subcutaneous Nightly   ??? insulin lispro  1-20 Units Subcutaneous ACHS   ??? insulin lispro  15 Units Subcutaneous TID AC   ??? isosorbide mononitrate  60 mg Oral Tu,Th,Sa,Su   ??? metoprolol succinate  100 mg Oral Daily   ??? mirtazapine  7.5 mg Oral Nightly   ??? nicotine  1 patch Transdermal Daily   ??? pantoprazole  40 mg Oral Daily   ??? polyethylene glycol  17 g Oral BID   ??? ranolazine  500 mg Oral BID   ??? senna  2 tablet Oral BID ??? sevelamer  2,400 mg Oral 3xd Meals   ??? ticagrelor  90 mg Oral BID     Continuous Infusions:    [name] MD  [facility].

## 2020-05-27 LAB — BLOOD GAS CRITICAL CARE PANEL, VENOUS
CALCIUM IONIZED VENOUS (MG/DL): 7.67 mg/dL (ref 4.40–5.40)
GLUCOSE WHOLE BLOOD: 190 mg/dL — ABNORMAL HIGH (ref 70–179)
HEMOGLOBIN BLOOD GAS: 11.6 g/dL — ABNORMAL LOW
LACTATE BLOOD VENOUS: 19 mmol/L (ref 0.5–1.8)
O2 SATURATION VENOUS: <5 % — ABNORMAL LOW (ref 40.0–85.0)
PCO2 VENOUS: >100 mmHg (ref 40–60)
PH VENOUS: <6.83 — CL (ref 7.32–7.43)
PO2 VENOUS: <20 mmHg — ABNORMAL LOW (ref 30–55)
POTASSIUM WHOLE BLOOD: 12 mmol/L (ref 3.4–4.6)
SODIUM WHOLE BLOOD: 141 mmol/L (ref 135–145)

## 2020-05-27 LAB — COMPREHENSIVE METABOLIC PANEL
ALBUMIN: 2.9 g/dL — ABNORMAL LOW (ref 3.4–5.0)
ALBUMIN: 2.1 g/dL — ABNORMAL LOW (ref 3.4–5.0)
ALKALINE PHOSPHATASE: 135 U/L — ABNORMAL HIGH (ref 46–116)
ALKALINE PHOSPHATASE: 123 U/L — ABNORMAL HIGH (ref 46–116)
ALT (SGPT): 820 U/L — ABNORMAL HIGH (ref 10–49)
ALT (SGPT): 3741 U/L — ABNORMAL HIGH (ref 10–49)
ANION GAP: 14 mmol/L (ref 5–14)
ANION GAP: 18 mmol/L — ABNORMAL HIGH (ref 5–14)
AST (SGOT): 612 U/L — ABNORMAL HIGH (ref <=34)
AST (SGOT): 2635 U/L — ABNORMAL HIGH (ref <=34)
BILIRUBIN TOTAL: 0.3 mg/dL (ref 0.3–1.2)
BILIRUBIN TOTAL: 0.2 mg/dL — ABNORMAL LOW (ref 0.3–1.2)
BLOOD UREA NITROGEN: 63 mg/dL — ABNORMAL HIGH (ref 9–23)
BLOOD UREA NITROGEN: 64 mg/dL — ABNORMAL HIGH (ref 9–23)
BUN / CREAT RATIO: 9
BUN / CREAT RATIO: 11
CALCIUM: 13.8 mg/dL (ref 8.7–10.4)
CALCIUM: 11.4 mg/dL — ABNORMAL HIGH (ref 8.7–10.4)
CHLORIDE: 101 mmol/L (ref 98–107)
CHLORIDE: 103 mmol/L (ref 98–107)
CO2: 22 mmol/L (ref 20.0–31.0)
CO2: 22 mmol/L (ref 20.0–31.0)
CREATININE: 6.91 mg/dL — ABNORMAL HIGH
CREATININE: 6.05 mg/dL — ABNORMAL HIGH
EGFR CKD-EPI AA MALE: 10 mL/min/{1.73_m2} — ABNORMAL LOW (ref >=60)
EGFR CKD-EPI AA MALE: 11 mL/min/{1.73_m2} — ABNORMAL LOW (ref >=60)
EGFR CKD-EPI NON-AA MALE: 8 mL/min/{1.73_m2} — ABNORMAL LOW (ref >=60)
EGFR CKD-EPI NON-AA MALE: 10 mL/min/{1.73_m2} — ABNORMAL LOW (ref >=60)
GLUCOSE RANDOM: 178 mg/dL (ref 70–179)
GLUCOSE RANDOM: 301 mg/dL — ABNORMAL HIGH (ref 70–179)
POTASSIUM: >10 mmol/L (ref 3.4–4.5)
POTASSIUM: >10 mmol/L (ref 3.4–4.5)
PROTEIN TOTAL: 6.6 g/dL (ref 5.7–8.2)
PROTEIN TOTAL: 4.9 g/dL — ABNORMAL LOW (ref 5.7–8.2)
SODIUM: 137 mmol/L (ref 135–145)
SODIUM: 143 mmol/L (ref 135–145)

## 2020-05-27 LAB — CBC
HEMATOCRIT: 40.2 % — ABNORMAL LOW (ref 41.0–53.0)
HEMATOCRIT: 37.3 % — ABNORMAL LOW (ref 41.0–53.0)
HEMOGLOBIN: 11.6 g/dL — ABNORMAL LOW (ref 13.5–17.5)
HEMOGLOBIN: 10.6 g/dL — ABNORMAL LOW (ref 13.5–17.5)
MEAN CORPUSCULAR HEMOGLOBIN CONC: 29 g/dL — ABNORMAL LOW (ref 31.0–37.0)
MEAN CORPUSCULAR HEMOGLOBIN CONC: 28.4 g/dL — ABNORMAL LOW (ref 31.0–37.0)
MEAN CORPUSCULAR HEMOGLOBIN: 29.3 pg (ref 26.0–34.0)
MEAN CORPUSCULAR HEMOGLOBIN: 29.3 pg (ref 26.0–34.0)
MEAN CORPUSCULAR VOLUME: 101.2 fL — ABNORMAL HIGH (ref 80.0–100.0)
MEAN CORPUSCULAR VOLUME: 103 fL — ABNORMAL HIGH (ref 80.0–100.0)
MEAN PLATELET VOLUME: 9.4 fL (ref 7.0–10.0)
MEAN PLATELET VOLUME: 9.7 fL (ref 7.0–10.0)
PLATELET COUNT: 279 10*9/L (ref 150–440)
PLATELET COUNT: 202 10*9/L (ref 150–440)
RED BLOOD CELL COUNT: 3.97 10*12/L — ABNORMAL LOW (ref 4.50–5.90)
RED BLOOD CELL COUNT: 3.62 10*12/L — ABNORMAL LOW (ref 4.50–5.90)
RED CELL DISTRIBUTION WIDTH: 16.7 % — ABNORMAL HIGH (ref 12.0–15.0)
RED CELL DISTRIBUTION WIDTH: 16.9 % — ABNORMAL HIGH (ref 12.0–15.0)
WBC ADJUSTED: 16 10*9/L — ABNORMAL HIGH (ref 4.5–11.0)
WBC ADJUSTED: 14.6 10*9/L — ABNORMAL HIGH (ref 4.5–11.0)

## 2020-05-27 LAB — HIGH SENSITIVITY TROPONIN I - SINGLE: HIGH SENSITIVITY TROPONIN I: 52 ng/L (ref <=53)

## 2020-05-27 LAB — PROTIME-INR
INR: 1.49
PROTIME: 17.4 s — ABNORMAL HIGH (ref 10.3–13.4)

## 2020-05-27 MED ADMIN — sevelamer (RENVELA) tablet 2,400 mg: 2400 mg | ORAL | @ 23:00:00

## 2020-05-27 MED ADMIN — albuterol (PROVENTIL HFA;VENTOLIN HFA) 90 mcg/actuation inhaler 1 puff: 1 | RESPIRATORY_TRACT | @ 18:00:00 | Stop: 2020-05-27

## 2020-05-27 MED ADMIN — acetaminophen (TYLENOL) tablet 650 mg: 650 mg | ORAL | @ 12:00:00

## 2020-05-27 MED ADMIN — methocarbamoL (ROBAXIN) tablet 500 mg: 500 mg | ORAL | @ 09:00:00

## 2020-05-27 MED ADMIN — insulin glargine (LANTUS) injection 35 Units: 35 [IU] | SUBCUTANEOUS | @ 03:00:00

## 2020-05-27 MED ADMIN — apixaban (ELIQUIS) tablet 5 mg: 5 mg | ORAL | @ 14:00:00

## 2020-05-27 MED ADMIN — cholecalciferol (vitamin D3 25 mcg (1,000 units)) tablet 25 mcg: 25 ug | ORAL | @ 14:00:00

## 2020-05-27 MED ADMIN — apixaban (ELIQUIS) tablet 5 mg: 5 mg | ORAL | @ 03:00:00

## 2020-05-27 MED ADMIN — atorvastatin (LIPITOR) tablet 80 mg: 80 mg | ORAL | @ 14:00:00

## 2020-05-27 MED ADMIN — gabapentin (NEURONTIN) capsule 100 mg: 100 mg | ORAL | @ 14:00:00

## 2020-05-27 MED ADMIN — acetaminophen (TYLENOL) tablet 650 mg: 650 mg | ORAL | @ 03:00:00

## 2020-05-27 MED ADMIN — oxyCODONE (ROXICODONE) immediate release tablet 5 mg: 5 mg | ORAL | @ 07:00:00 | Stop: 2020-05-30

## 2020-05-27 MED ADMIN — diclofenac sodium (VOLTAREN) 1 % gel 2 g: 2 g | TOPICAL | @ 05:00:00

## 2020-05-27 MED ADMIN — ezetimibe (ZETIA) tablet 10 mg: 10 mg | ORAL | @ 03:00:00

## 2020-05-27 MED ADMIN — diclofenac sodium (VOLTAREN) 1 % gel 2 g: 2 g | TOPICAL | @ 23:00:00

## 2020-05-27 MED ADMIN — pantoprazole (PROTONIX) EC tablet 40 mg: 40 mg | ORAL | @ 14:00:00

## 2020-05-27 MED ADMIN — ranolazine (RANEXA) 12 hr tablet 500 mg: 500 mg | ORAL | @ 03:00:00

## 2020-05-27 MED ADMIN — ranolazine (RANEXA) 12 hr tablet 500 mg: 500 mg | ORAL | @ 14:00:00

## 2020-05-27 MED ADMIN — mirtazapine (REMERON) tablet 7.5 mg: 7.5 mg | ORAL | @ 03:00:00

## 2020-05-27 MED ADMIN — metoprolol succinate (TOPROL-XL) 24 hr tablet 100 mg: 100 mg | ORAL | @ 14:00:00

## 2020-05-27 MED ADMIN — FLUoxetine (PROzac) capsule 80 mg: 80 mg | ORAL | @ 03:00:00

## 2020-05-27 MED ADMIN — methocarbamoL (ROBAXIN) tablet 500 mg: 500 mg | ORAL | @ 18:00:00

## 2020-05-27 MED ADMIN — amiodarone (PACERONE) tablet 200 mg: 200 mg | ORAL | @ 23:00:00

## 2020-05-27 MED ADMIN — oxyCODONE (ROXICODONE) immediate release tablet 5 mg: 5 mg | ORAL | @ 15:00:00 | Stop: 2020-05-30

## 2020-05-27 MED ADMIN — senna (SENOKOT) tablet 2 tablet: 2 | ORAL | @ 14:00:00

## 2020-05-27 MED ADMIN — senna (SENOKOT) tablet 2 tablet: 2 | ORAL | @ 03:00:00

## 2020-05-27 MED ADMIN — amiodarone (PACERONE) tablet 200 mg: 200 mg | ORAL | @ 14:00:00

## 2020-05-27 MED ADMIN — gabapentin (NEURONTIN) capsule 100 mg: 100 mg | ORAL | @ 03:00:00

## 2020-05-27 MED ADMIN — bumetanide (BUMEX) tablet 2 mg: 2 mg | ORAL | @ 23:00:00

## 2020-05-27 MED ADMIN — allopurinoL (ZYLOPRIM) tablet 200 mg: 200 mg | ORAL | @ 14:00:00

## 2020-05-27 MED ADMIN — sevelamer (RENVELA) tablet 2,400 mg: 2400 mg | ORAL | @ 14:00:00

## 2020-05-27 MED ADMIN — ticagrelor (BRILINTA) tablet 90 mg: 90 mg | ORAL | @ 03:00:00

## 2020-05-27 MED ADMIN — isosorbide mononitrate (IMDUR) 24 hr tablet 60 mg: 60 mg | ORAL | @ 23:00:00

## 2020-05-27 MED ADMIN — acetaminophen (TYLENOL) tablet 650 mg: 650 mg | ORAL | @ 20:00:00

## 2020-05-27 MED ADMIN — polyethylene glycol (MIRALAX) packet 17 g: 17 g | ORAL | @ 05:00:00

## 2020-05-27 MED ADMIN — diclofenac sodium (VOLTAREN) 1 % gel 2 g: 2 g | TOPICAL | @ 12:00:00

## 2020-05-27 MED ADMIN — ticagrelor (BRILINTA) tablet 90 mg: 90 mg | ORAL | @ 14:00:00

## 2020-05-27 MED ADMIN — diclofenac sodium (VOLTAREN) 1 % gel 2 g: 2 g | TOPICAL | @ 18:00:00

## 2020-05-27 NOTE — Unmapped (Signed)
No acute events overnight. Vital signs stable. Patient required PRN pain medication during the night. No other complaints. Patient resting in bed.     Problem: Adult Inpatient Plan of Care  Goal: Plan of Care Review  06/20/2020 0036 by Lavell Anchors, RN  Outcome: Progressing  06/20/20 0036 by Lavell Anchors, RN  Outcome: Progressing  06-20-2020 0036 by Lavell Anchors, RN  Outcome: Progressing  Goal: Patient-Specific Goal (Individualized)  Jun 20, 2020 0036 by Lavell Anchors, RN  Outcome: Progressing  June 20, 2020 0036 by Lavell Anchors, RN  Outcome: Progressing  2020-06-20 0036 by Lavell Anchors, RN  Outcome: Progressing  Goal: Absence of Hospital-Acquired Illness or Injury  2020/06/20 0036 by Lavell Anchors, RN  Outcome: Progressing  06-20-2020 0036 by Lavell Anchors, RN  Outcome: Progressing  06-20-2020 0036 by Lavell Anchors, RN  Outcome: Progressing  Intervention: Identify and Manage Fall Risk  Recent Flowsheet Documentation  Taken 05/26/2020 2155 by Lavell Anchors, RN  Safety Interventions:  ??? aspiration precautions  ??? bed alarm  ??? fall reduction program maintained  ??? infection management  ??? lighting adjusted for tasks/safety  ??? low bed  Intervention: Prevent Skin Injury  Recent Flowsheet Documentation  Taken 05/26/2020 2155 by Lavell Anchors, RN  Skin Protection: incontinence pads utilized  Intervention: Prevent and Manage VTE (Venous Thromboembolism) Risk  Recent Flowsheet Documentation  Taken 05/26/2020 2155 by Lavell Anchors, RN  Activity Management: activity adjusted per tolerance  VTE Prevention/Management: anticoagulant therapy  Intervention: Prevent Infection  Recent Flowsheet Documentation  Taken 05/26/2020 2155 by Lavell Anchors, RN  Infection Prevention:  ??? cohorting utilized  ??? single patient room provided  ??? hand hygiene promoted  Goal: Optimal Comfort and Wellbeing  06/20/20 0036 by Lavell Anchors, RN  Outcome: Progressing  06-20-20 0036 by Lavell Anchors, RN  Outcome: Progressing  06-20-20 0036 by Lavell Anchors, RN  Outcome: Progressing  Goal: Readiness for Transition of Care  06-20-20 0036 by Lavell Anchors, RN  Outcome: Progressing  Jun 20, 2020 0036 by Lavell Anchors, RN  Outcome: Progressing  06/20/20 0036 by Lavell Anchors, RN  Outcome: Progressing  Goal: Rounds/Family Conference  20-Jun-2020 0036 by Lavell Anchors, RN  Outcome: Progressing  06-20-20 0036 by Lavell Anchors, RN  Outcome: Progressing  06/20/2020 0036 by Lavell Anchors, RN  Outcome: Progressing     Problem: Pain Acute  Goal: Acceptable Pain Control and Functional Ability  Jun 20, 2020 0036 by Lavell Anchors, RN  Outcome: Progressing  2020-06-20 0036 by Lavell Anchors, RN  Outcome: Progressing  20-Jun-2020 0036 by Lavell Anchors, RN  Outcome: Progressing     Problem: Fall Injury Risk  Goal: Absence of Fall and Fall-Related Injury  06-20-2020 0036 by Lavell Anchors, RN  Outcome: Progressing  June 20, 2020 0036 by Lavell Anchors, RN  Outcome: Progressing  06/20/20 0036 by Lavell Anchors, RN  Outcome: Progressing  Intervention: Promote Injury-Free Environment  Recent Flowsheet Documentation  Taken 05/26/2020 2155 by Lavell Anchors, RN  Safety Interventions:  ??? aspiration precautions  ??? bed alarm  ??? fall reduction program maintained  ??? infection management  ??? lighting adjusted for tasks/safety  ??? low bed     Problem: Device-Related Complication Risk (Hemodialysis)  Goal: Safe, Effective Therapy Delivery  06-20-2020 0036 by Lavell Anchors, RN  Outcome: Progressing  2020/06/20 0036 by Lavell Anchors, RN  Outcome: Progressing  06-20-2020 0036 by Lavell Anchors, RN  Outcome: Progressing     Problem: Hemodynamic Instability (Hemodialysis)  Goal: Effective Tissue Perfusion  2020-06-20 0036 by Lavell Anchors, RN  Outcome: Progressing  06/20/20 0036 by Seward Grater  Hoa Deriso, RN  Outcome: Progressing  05/11/2020 0036 by Lavell Anchors, RN  Outcome: Progressing     Problem: Infection (Hemodialysis)  Goal: Absence of Infection Signs and Symptoms  05/26/2020 0036 by Lavell Anchors, RN  Outcome: Progressing  05/12/2020 0036 by Lavell Anchors, RN  Outcome: Progressing  05/15/2020 0036 by Lavell Anchors, RN  Outcome: Progressing

## 2020-05-27 NOTE — Unmapped (Signed)
Pt has been free from falls throughout the shift. Refused bath by NA. Wife called and was updated with anticipated plan. Pt requested PRN pain medications primarily for bilateral hand pain. Pt slept off and on in between doses. No c/o N/V/D. VSS  Problem: Adult Inpatient Plan of Care  Goal: Plan of Care Review  Outcome: Progressing  Goal: Patient-Specific Goal (Individualized)  Outcome: Progressing  Goal: Absence of Hospital-Acquired Illness or Injury  Outcome: Progressing  Goal: Optimal Comfort and Wellbeing  Outcome: Progressing  Goal: Readiness for Transition of Care  Outcome: Progressing  Goal: Rounds/Family Conference  Outcome: Progressing     Problem: Pain Acute  Goal: Acceptable Pain Control and Functional Ability  Outcome: Progressing     Problem: Fall Injury Risk  Goal: Absence of Fall and Fall-Related Injury  Outcome: Progressing     Problem: Device-Related Complication Risk (Hemodialysis)  Goal: Safe, Effective Therapy Delivery  Outcome: Progressing     Problem: Hemodynamic Instability (Hemodialysis)  Goal: Effective Tissue Perfusion  Outcome: Progressing     Problem: Infection (Hemodialysis)  Goal: Absence of Infection Signs and Symptoms  Outcome: Progressing

## 2020-05-27 NOTE — Unmapped (Addendum)
[facility] [place] Hospitalist Daily Progress Note     LOS: 11 days     Assessment/Plan:  Principal Problem:    Paresthesia of left upper extremity  Active Problems:    Coronary artery disease of native artery of native heart with stable angina pectoris (CMS-[name])    Obesity, Class III, BMI 40-49.9 (morbid obesity) (CMS-[name])    Osteoarthritis    Tobacco use disorder    HFrEF (heart failure with reduced ejection fraction) (CMS-[name])    Transient alteration of awareness    Thoracic back pain  Resolved Problems:    Chest pain    Weakness    Dysarthria         Left sided weakness   See hospital course for detailed discussion. Known left foot drop without other focal deficits.   - Speech: Aspiration precautions, with regular diet.   - PT/OT: Recommending 5x low. 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 Rapid response called. In retrospect, Mr Jann reported he was just sleeping.   - Follow clinically     Back pain- MSK related.   - Tylenol q8h   - Voltaren gel   - lidocaine patch   - PT/OT   - Encourage mobilization   ??  Left ankle pain:   After fall. X-rays on admission notable for diffuse soft tissue swelling but no acute osseous abnormalities.  - Treat conservatively   ??  Adrenal lesions Radiology recommends follow up imaging in 1 year.  ??  CAD s/p PCI (last 02/2020): Significant disease, most recent cath 02/2020 with severe 2v disease. Often has angina with dialysis. Per notes, guarded prognosis, have discussed palliative care outpatient given ongoing symptoms.   - Continue home metoprolol, ticagrelor, atorvastatin, zetia, isosorbide (non-HD days) and ranexa  ??  Ecchymosis : noted over shoulder, bilateral abdomen/sides. CBC, PT/INR, PTT, fibrinogen normal and Hgb stable through admission. Presumably evolving ecchymosis that likely developed after minor trauma while on Eliquis and antiplatelet agent.  ??  Paroxysmal Afib   HR acceptable, rhythm has been paced most of admission   - Amiodarone, Eliquis and toprol XL  ??  HFrEF (20-25%, AICD in place)   - Toprol XL, statin, bumex on non-HD days  ??  DM2, insulin dependent   Home regimen 35 units nightly detemir and 15 units asprat TIC AC. Glycemic control sub optimal with steroids. Appears to be requiring less insulin off prednisone.   - Lantus 35 units qhs  - Lispro 15u TID AC   - SSI Standard scale   - Per wife, needs to get his insulin in his arm, not abdomen. Nursing is aware     ESRD on HD [name]:   Dialyzes through R tunneled catheter, typically [name].  Still makes some urine.   - Nephrology following  - Home sevalamer  - Bumex on non dialysis days     Hyperkalemia: within his range of fluctuation currently. Will CTM. HD as above.  ??  Anxiety  - Fluoxetine  - Consider Palliative Care consult for significant issues coping with medical diagnoses (has been referred outpatient)  ??  Tobacco Abuse   Chewing tobacco, around 0.5 cans weekly per family.   - Tobacco cessation counseling evaluated patient, appreciate their assistance  - Nicotine patch 14mg     - Nicotine gum 4mg  every hour PRN  ??  Resolved   Chest pain: CTA was negative and troponins have been negative. ECG with no acute changes as well.  Report of possible T1 abnormality on OSH imaging: no evidence of true pathology and is most consistent with an artifact. Cardiology notes while an MRI could be done - this would be tricky, but possible, and would have to be programmed under direction of EP attending.   Facial palsy consistent with Bell's palsy:   Possibly associated with recent COVID-19 infection, though could be due to a number of etiologies. Exam unremarkable this morning.   - Completed prednisone 60 qd on 2/23  - Completed valacyclovir on 2/23??  ??    DVT prophylaxis. Apixaban      Disposition: Floor, DC to SNF when able      Please page the Hospital[facility] pager at [phone] with questions.    IPending labs:       Subjective:   Yesterday complained to nursing about some tingling in his b/l hands. This morning, no complaints of his hands, but feels he is SOB. Of note, when I entered the room he was napping without his bipap on. He feels he has secretions in his throat that will improve with albuterol treatment. Lungs were clear and with good air movement, which I informed him of. He asked about a spot on his abdomen, which appears to be a small hematoma in the center of one of his ecchymoses.        Objective:   Physical Exam:    GEN: Supine in bed. Generally well appearing  HEENT: normal appearing nose, MMM  RESPIRATORY: Breathing pattern was normal and the chest moved symmetrically.  Lung sounds were normal and there were no adventitious sounds.  Good air movement.  CARDIOVASCULAR: Heart rate and rhythm were normal.  S1 and S2 were normal and there were no extra sounds or murmurs.   ABDOMEN: The abdomen was obese in contour.  Bowel sounds were present.    Palpation detected no tenderness.   SKIN/HAIR/NAILS: ecchymotic lesions on lateral abdomen/flank, back, and chest. On upper L abdomen, there is a small palpable knot in the center of onf of the healing ecchymoses  NEUROLOGIC: Mental status was normal.  The patient was normally coordinated in the arms.   PSYCHIATRIC: The patient was oriented to person, place, time, and circumstance. Speech was normal. Mood and affect were normal.     Vital signs in last 24 hours:  Temp:  [36.4 ??C (97.5 ??F)-36.6 ??C (97.9 ??F)] 36.5 ??C (97.7 ??F)  Heart Rate:  [70] 70  Resp:  [17-20] 20  BP: (169-179)/(87-97) 169/97  MAP (mmHg):  [111-119] 119  SpO2:  [94 %-99 %] 94 %    Intake/Output last 24 hours:    Intake/Output Summary (Last 24 hours) at 05/09/2020 1341  Last data filed at 05/26/2020 2155  Gross per 24 hour   Intake 240 ml   Output 400 ml   Net -160 ml       Medications:   Scheduled Meds:  ??? acetaminophen  650 mg Oral Q8H   ??? allopurinoL  200 mg Oral Daily   ??? amiodarone  200 mg Oral BID with meals   ??? apixaban  5 mg Oral BID   ??? atorvastatin  80 mg Oral Daily   ??? bumetanide  2 mg Oral Tu,Th,Sa,Su   ??? cholecalciferol (vitamin D3 25 mcg (1,000 units))  25 mcg Oral Daily   ??? diclofenac sodium  2 g Topical QID   ??? ezetimibe  10 mg Oral Nightly   ??? FLUoxetine  80 mg Oral Daily   ??? gabapentin  100 mg Oral BID   ??? insulin glargine  35 Units Subcutaneous Nightly   ??? insulin lispro  1-20 Units Subcutaneous ACHS   ??? insulin lispro  15 Units Subcutaneous TID AC   ??? isosorbide mononitrate  60 mg Oral Tu,Th,Sa,Su   ??? metoprolol succinate  100 mg Oral Daily   ??? mirtazapine  7.5 mg Oral Nightly   ??? nicotine  1 patch Transdermal Daily   ??? pantoprazole  40 mg Oral Daily   ??? polyethylene glycol  17 g Oral BID   ??? ranolazine  500 mg Oral BID   ??? senna  2 tablet Oral BID   ??? sevelamer  2,400 mg Oral 3xd Meals   ??? ticagrelor  90 mg Oral BID     Continuous Infusions:    [name] MD  [facility].

## 2020-05-28 MED ADMIN — EPINEPHrine (ADRENALIN) 0.1 mg/mL injection: INTRAVENOUS | @ 03:00:00 | Stop: 2020-05-27

## 2020-05-28 MED ADMIN — calcium chloride 100 mg/mL (10 %) syringe: INTRAVENOUS | @ 03:00:00 | Stop: 2020-05-27

## 2020-05-28 MED ADMIN — sodium bicarbonate injection: INTRAVENOUS | @ 04:00:00 | Stop: 2020-05-27

## 2020-05-28 MED ADMIN — sodium bicarbonate injection: INTRAVENOUS | @ 01:00:00 | Stop: 2020-05-27

## 2020-05-28 MED ADMIN — EPINEPHrine (ADRENALIN) 0.1 mg/mL injection: INTRAVENOUS | @ 04:00:00 | Stop: 2020-05-27

## 2020-05-28 MED ADMIN — sodium bicarbonate injection: INTRAVENOUS | @ 03:00:00 | Stop: 2020-05-27

## 2020-05-28 MED ADMIN — sodium chloride 0.9% (NS) bolus: INTRAVENOUS | @ 03:00:00 | Stop: 2020-05-27

## 2020-05-28 NOTE — Unmapped (Signed)
Physician Discharge Summary [facility]  [address] [facility] OBSERVATION [facility]  [address]  [place] [address]  Dept: [phone]  Loc: [phone]     Identifying Information:   Jeremy Oliver  1970/12/16  [id number]    Primary Care Physician: Kurtis Bushman, MD   Code Status: Full Code    Admit Date: 05/18/2020    Discharge Date: 06-07-2020     Discharge To: Deceased -  Jeremy Oliver [birth year] had a pronouncement of death 06/06/2020 and the time of death is 22:37 . The pronouncement of death was made by Tama Gander, NP.   The events prior to death were as described below, with patient ultimately having a code blue event and passing after attempted resuscitation.  The parties present at time of death was/were physicians. The patient's HCPOA was notified of the death. This case was not referred to the medical examiner. An autopsy was declined.    Discharge Service: [facility]     Discharge Attending Physician: No att. providers found    Discharge Diagnoses:  Principal Problem:    Paresthesia of left upper extremity POA: Yes  Active Problems:    Coronary artery disease of native artery of native heart with stable angina pectoris (CMS-[name]) POA: Yes    Obesity, Class III, BMI 40-49.9 (morbid obesity) (CMS-[name]) POA: Yes    Osteoarthritis POA: Yes    Tobacco use disorder POA: Yes    HFrEF (heart failure with reduced ejection fraction) (CMS-[name]) POA: Yes    Transient alteration of awareness POA: Yes    Thoracic back pain POA: Yes  Resolved Problems:    Chest pain POA: Unknown    Weakness POA: Unknown    Dysarthria POA: Unknown    Hospital Course:   Jeremy Oliver Righter is a 50 y.o. 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  On June 06, 2022, code blue was called when patient was found unresponsive and pulseless. Hospital course is detailed by problem below.    Cardiopulmonary Arrest: Patient was found unresponsive and pulseless without respirations  by nursing on 06-06-22. Code blue was called and resuscitation was attempted before time of death was pronounced. Exact etiology of death is unclear. Potassium and BP were uptrending slowly prior to arrest after incomplete HD on 2/25. Coronary or primary cardiac event is also possible given that patient had a longstanding history of severe, progressive cardiac disease with very guarded prognosis. Autopsy was not elected by family.    Left sided weakness:   Initially concerned for possible stroke, though symptoms had been ongoing >1 week and he had a normal CTA head at OSH showed no evidence of emergent large vessel occlusion or significant stenosis.  Left arm and leg weakness is of unclear etiology - per Neurology, exam was inconsistent and his LLE weakness and foot drop was chronic.    Pseudoseizure:   During prior hospitalizations has had unresponsive episodes with normal vital signs with full workup including cardiac workup, central head imaging, neurology consult and EEG. 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 On 2/25, had a similar event during iHD. Later, patient reported he was just sleeping during event. HD was not completed as a result of the event given risk of uncontrolled movement during HD session.Marland Kitchen  Report of possible T1 abnormality on OSH imaging  During recent admission to [facility], initial concern for spinal pathology based on CT, though repeat CT reportedly normal by their read. On further review of imaging by our radiologists, no evidence of true pathology and T1 abnormality seen on 2/11 was most consistent with an artifact.     Facial palsy consistent with Bell's palsy:   Likely associated with recent COVID-19 infection, though other etiologies considered. Treated with prednisone 60mg  daily x7 days (2/17-23) and valacyclovir 500mg  daily x7 days (on HD days should get after dialysis).      Back pain: Suspected to be musculoskeletal in origin given report that it started when he was picking up something heavy with a twisting motion and that it runs across both of his shoulders. Very tender to light palpation on exam. Treated with scheduled tylenol, lidocaine patch, voltaren gel, and prn oxycodone during admission.    Left ankle pain  Bruising   Secondary to fall. X-rays on admission notable for diffuse soft tissue swelling but no acute osseous abnormalities. Treated conservatively. CBC and coagulation labs not concerning. Suspected evolving bruising that likely developed after minor trauma given his Eliquis and antiplatelet agent. Improved over the course of the hospitalization     Chest pain:   Initially reported to ED, though improved shortly after admission. Given this and associated back pain and neuro findings, there was concern for dissection, but CTA was negative and troponins have been negative with no acute changes on ECG.      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 Often reported angina with dialysis. Per notes, guarded prognosis given lack of options for advanced heart failure therapy or transplant. Palliative care had been discussed previously. Continued on home metoprolol, ticagrelor, atorvastatin, zetia, bumex, isosorbide, and ranexa during admission.    Paroxysmal Afib: treated with home amiodarone, Eliquis and toprol XL.    DM2, insulin dependent: Maintained on insulin. Elevated sugars in the setting of prednisone for Bells Palsy.     ESRD on HD [name]      Anxiety: Continued home fluoxetine.    Adrenal lesions   Noted to have bilateral macroscopic fat-containing heterogeneous adrenal lesions, which had increased significantly in size over time. Favored to be benign, but planned interval imaging given possibility of adrenal myelolipoma.      Procedures:  None  No admission procedures for hospital encounter.  ______________________________________________________________________  Discharge Medications:     Your Medication List      STOP taking these medications    diphenhydrAMINE 25 mg tablet  Commonly known as: BENADRYL     loperamide 2 mg capsule  Commonly known as: IMODIUM     ondansetron 4 MG tablet  Commonly known as: ZOFRAN        START taking these medications    polyethylene glycol 17 gram packet  Commonly known as: MIRALAX  Take 17 g by mouth daily.        CHANGE how you take these medications    lancets Misc  Use as directed to check blood glucose Four (4) times a day (before meals and nightly).  What changed: Another medication with the same name was removed. Continue taking this medication, and follow the directions you see here.        CONTINUE taking these medications    acetaminophen 500 MG tablet  Commonly known as: TYLENOL  TAKE 2 TABLETS (1000MG ) BY MOUTH EVERY 8 HOURS AS NEEDED FOR PAIN     allopurinoL 100 MG tablet  Commonly known as: ZYLOPRIM  Take 2 tablets (200 mg total) by mouth daily.     amiodarone 200 MG tablet  Commonly known as: PACERONE  Take 1 tablet (200 mg total) by mouth 2 (two) times daily.     atorvastatin 80 MG tablet  Commonly known as: LIPITOR  Take 1 tablet (80 mg total) by mouth daily.     back brace Misc  1 each by Miscellaneous Oliver daily as needed.     BLOOD PRESSURE CUFF Misc  Generic drug: miscellaneous medical supply  Measure BP once a day or if any symptoms     blood sugar diagnostic Strp  Use as directed to check blood glucose Four (4) times a day (before meals and nightly).     blood-glucose meter kit  Disp. blood glucose meter kit preferred by patient's insurance. Dx: Diabetes, E11.9     bumetanide 2 MG tablet  Commonly known as: BUMEX  Take 1 tablet (2 mg total) by mouth two (2) times a day. On non dialysis days     cholecalciferol (vitamin D3 25 mcg (1,000 units)) 1,000 unit (25 mcg) tablet  Take 1 tablet (25 mcg total) by mouth daily.     diclofenac sodium 1 % gel  Commonly known as: VOLTAREN  Apply 2 grams topically Three (3) times a day.     ELIQUIS 5 mg Tab  Generic drug: apixaban  Take 1 tablet (5 mg total) by mouth Two (2) times a day.     empty container Misc  Commonly known as: SHARPS-A-GATOR DISPOSAL SYSTEM  Use as directed for sharps disposal     ezetimibe 10 mg tablet  Commonly known as: ZETIA  TAKE 1 TABLET BY MOUTH ONCE DAILY     FLUoxetine 40 MG capsule  Commonly known as: PROzac  Take 2 capsules (80 mg total) by mouth daily.     gabapentin 100 MG capsule  Commonly known as: NEURONTIN  Take 1 capsule (100 mg total) by mouth two (2) times a day.     insulin syringe-needle U-100 0.3 mL 31 gauge x 5/16 (8 mm) Syrg  Use to inject insulin twice daily with meals     isosorbide mononitrate 60 MG 24 hr tablet  Commonly known as: IMDUR  Take 1 tablet (60 mg total) by mouth daily. DO NOT TAKE on dialysis days     LEVEMIR FLEXTOUCH U-100 INSULN 100 unit/mL (3 mL) injection pen  Generic drug: insulin detemir U-100  Inject 0.35 mL (35 Units total) under the skin nightly.     methocarbamoL 500 MG tablet  Commonly known as: ROBAXIN  Take 1 tablet (500 mg total) by mouth every eight (8) hours as needed (for back pain) for up to 5 days.     metoprolol succinate 100 MG 24 hr tablet  Commonly known as: TOPROL XL  Take 1 tablet (100 mg total) by mouth daily.     mirtazapine 7.5 MG tablet  Commonly known as: REMERON  Take 1 tablet (7.5 mg total) by mouth nightly.     nitroglycerin 0.4 mg/dose spray  Commonly known as: NITROLINGUAL  Place 1 spray under the tongue every five (5) minutes as needed for chest pain. pT MUST HAVE SPRAY DUE TO DRY MOUTH AND PILLS DO NOT DISSOLVE     omeprazole 40 MG capsule  Commonly known as: PriLOSEC  Take  1 capsule (40 mg total) by mouth Two (2) times a day (30 minutes before a meal).     PRALUENT PEN 150 mg/mL subcutaneous injection  Generic drug: alirocumab  Inject the contents of 1 pen (150 mg total) under the skin every fourteen (14) days.     ranolazine 500 MG 12 hr tablet  Commonly known as: RANEXA  Take 1 tablet (500 mg total) by mouth Two (2) times a day.     sevelamer 800 mg tablet  Commonly known as: RENVELA  Take 3 tablets (2,400 mg total) by mouth Three (3) times a day with a meal.     ticagrelor 90 mg Tab  Commonly known as: BRILINTA  TAKE 1 TABLET BY MOUTH TWICE DAILY     UNIFINE PENTIPS 31 gauge x 5/16 (8 mm) Ndle  Generic drug: pen needle, diabetic  USE WITH INSULIN AND VICTOZA 5 TIMES DAILY     VENTOLIN HFA 90 mcg/actuation inhaler  Generic drug: albuterol  Inhale 2 puffs into the lungs every 6 (six) hours as needed for wheezing or shortness of breath.        ASK your doctor about these medications    NovoLOG Flexpen U-100 Insulin 100 unit/mL (3 mL) injection pen  Generic drug: insulin ASPART  Inject 15 Units under the skin Three (3) times a day before meals.     OZEMPIC 0.25 mg or 0.5 mg(2 mg/1.5 mL) Pnij injection  Generic drug: semaglutide  Inject 0.5 mg under the skin every seven (7) days.            Allergies:  Losartan and Morphine  ______________________________________________________________________  Pending Test Results (if blank, then none):      Most Recent Labs:  All lab results last 24 hours -   Recent Results (from the past 24 hour(s))   CBC    Collection Time: 06/16/2020 10:27 PM   Result Value Ref Range    WBC 16.0 (H) 4.5 - 11.0 10*9/L    RBC 3.97 (L) 4.50 - 5.90 10*12/L    HGB 11.6 (L) 13.5 - 17.5 g/dL    HCT 29.5 (L) 28.4 - 53.0 %    MCV 101.2 (H) 80.0 - 100.0 fL    MCH 29.3 26.0 - 34.0 pg    MCHC 29.0 (L) 31.0 - 37.0 g/dL    RDW 13.2 (H) 44.0 - 15.0 %    MPV 9.4 7.0 - 10.0 fL    Platelet 279 150 - 440 10*9/L   Comprehensive Metabolic Panel    Collection Time: 06-16-20 10:27 PM   Result Value Ref Range    Sodium 137 135 - 145 mmol/L    Potassium >10.0 (HH) 3.4 - 4.5 mmol/L    Chloride 101 98 - 107 mmol/L    Anion Gap 14 5 - 14 mmol/L    CO2 22.0 20.0 - 31.0 mmol/L    BUN 63 (H) 9 - 23 mg/dL    Creatinine 1.02 (H) 0.60 - 1.10 mg/dL    BUN/Creatinine Ratio 9     EGFR CKD-EPI Non-African American, Male 8 (L) >=60 mL/min/1.52m2    EGFR CKD-EPI African American, Male 10 (L) >=60 mL/min/1.30m2    Glucose 178 70 - 179 mg/dL    Calcium 72.5 (HH) 8.7 - 10.4 mg/dL    Albumin 2.9 (L) 3.4 - 5.0 g/dL    Total Protein 6.6 5.7 - 8.2 g/dL    Total Bilirubin 0.3 0.3 - 1.2 mg/dL    AST 366 (H) <=44  U/L    ALT 820 (H) 10 - 49 U/L    Alkaline Phosphatase 135 (H) 46 - 116 U/L   Blood Gas Critical Care Panel, Venous    Collection Time: 05/20/2020 10:27 PM   Result Value Ref Range    Specimen Source Venous     FIO2 Venous Not Specified     pH, Venous <6.83 (LL) 7.32 - 7.43    pCO2, Ven >100 (HH) 40 - 60 mm Hg    pO2, Ven <20 (L) 30 - 55 mm Hg    HCO3, Ven      Base Excess, Ven      O2 Saturation, Venous <5.0 (L) 40.0 - 85.0 %    Sodium Whole Blood 141 135 - 145 mmol/L    Potassium, Bld 12.0 (HH) 3.4 - 4.6 mmol/L    Calcium, Ionized Venous 7.67 (HH) 4.40 - 5.40 mg/dL    Glucose Whole Blood 190 (H) 70 - 179 mg/dL    Lactate, Venous 16.1 (HH) 0.5 - 1.8 mmol/L    Hgb, blood gas 11.60 (L) 13.50 - 17.50 g/dL   hsTroponin I (single, no delta)    Collection Time: 05/18/2020 10:27 PM   Result Value Ref Range    hsTroponin I 52 <=53 ng/L   CBC    Collection Time: 05/12/2020 10:31 PM   Result Value Ref Range    WBC 14.6 (H) 4.5 - 11.0 10*9/L    RBC 3.62 (L) 4.50 - 5.90 10*12/L    HGB 10.6 (L) 13.5 - 17.5 g/dL    HCT 09.6 (L) 04.5 - 53.0 %    MCV 103.0 (H) 80.0 - 100.0 fL    MCH 29.3 26.0 - 34.0 pg    MCHC 28.4 (L) 31.0 - 37.0 g/dL    RDW 40.9 (H) 81.1 - 15.0 %    MPV 9.7 7.0 - 10.0 fL    Platelet 202 150 - 440 10*9/L   Comprehensive Metabolic Panel    Collection Time: 05/02/2020 10:31 PM   Result Value Ref Range    Sodium 143 135 - 145 mmol/L    Potassium >10.0 (HH) 3.4 - 4.5 mmol/L    Chloride 103 98 - 107 mmol/L    Anion Gap 18 (H) 5 - 14 mmol/L    CO2 22.0 20.0 - 31.0 mmol/L    BUN 64 (H) 9 - 23 mg/dL    Creatinine 9.14 (H) 0.60 - 1.10 mg/dL    BUN/Creatinine Ratio 11     EGFR CKD-EPI Non-African American, Male 10 (L) >=60 mL/min/1.6m2    EGFR CKD-EPI African American, Male 11 (L) >=60 mL/min/1.11m2    Glucose 301 (H) 70 - 179 mg/dL    Calcium 78.2 (H) 8.7 - 10.4 mg/dL    Albumin 2.1 (L) 3.4 - 5.0 g/dL    Total Protein 4.9 (L) 5.7 - 8.2 g/dL    Total Bilirubin 0.2 (L) 0.3 - 1.2 mg/dL    AST 9,562 (H) <=13 U/L    ALT 3,741 (H) 10 - 49 U/L    Alkaline Phosphatase 123 (H) 46 - 116 U/L   PT-INR    Collection Time: 05/02/2020 10:34 PM   Result Value Ref Range    PT 17.4 (H) 10.3 - 13.4 sec    INR 1.49    GLUCOSE-BG/POC    Collection Time: 05/12/2020 10:34 PM   Result Value Ref Range    Glucose Whole Blood 265 (H) 70 - 179 mg/dL   POCT Venous Blood Gas  Collection Time: 05/25/2020 10:34 PM   Result Value Ref Range    Specimen Type - Venous Venous     pH, Venous <6.79 (LL) 7.32 - 7.42    pCO2, Ven >102 (HH) 40 - 60 mmHg    pO2, Ven <26.0 (L) 30.0 - 55.0 mmHg    HCO3, Ven      Base Excess, Ven      O2 Saturation, Venous      FIO2 100 %   POCT Ionized Calcium    Collection Time: 05/12/2020 10:34 PM   Result Value Ref Range    Ionized Calcium 5.9 (H) 4.4 - 5.4 mg/dL   POTASSIUM-BG/POC    Collection Time: 05/26/2020 10:34 PM   Result Value Ref Range    Potassium, Bld 10.7 (HH) 3.4 - 4.6 mmol/L   POCT Lactic acid (lactate) Venous    Collection Time: 05/26/2020 10:34 PM   Result Value Ref Range    Lactate, Venous 17.0 (HH) 0.5 - 1.8 mmol/L   SODIUM-BG/POC    Collection Time: 05/04/2020 10:34 PM   Result Value Ref Range    Sodium Whole Blood 144 135 - 145 mmol/L       Relevant Studies/Radiology (if blank, then none):  ECG 12 Lead    Result Date: 05/21/2020  ATRIAL-PACED RHYTHM WITH PROLONGED AV CONDUCTION LEFT VENTRICULAR HYPERTROPHY WITH QRS WIDENING AND REPOLARIZATION ABNORMALITY CANNOT RULE OUT ANTERIOR INFARCT  , AGE UNDETERMINED ABNORMAL ECG WHEN COMPARED WITH ECG OF 18-May-2020 19:17, NO SIGNIFICANT CHANGE WAS FOUND Confirmed by Huel Coventry [phone]) on 05/18/2020 12:02:06 AM    ECG 12 Lead    Result Date: 05/08/2020  NORMAL SINUS RHYTHM LEFT BUNDLE BRANCH BLOCK WHEN COMPARED WITH ECG OF 24-May-2020 15:57, ATRIAL PACED RHYTHM IS NOT EVIDENT QRS DURATION HAS INCREASED Confirmed by Schuyler Amor [phone]) on 05/03/2020 4:34:47 PM    ECG 12 Lead    Result Date: 05/20/2020  ATRIAL-PACED RHYTHM WITH PROLONGED AV CONDUCTION LEFT VENTRICULAR HYPERTROPHY WITH REPOLARIZATION ABNORMALITY NON-SPECIFIC ST/T WAVE CHANGES PROLONGED QT ABNORMAL ECG WHEN COMPARED WITH ECG OF 18-May-2020 12:13, NO SIGNIFICANT CHANGE WAS FOUND Confirmed by Christella Noa ([id number]) on 05/20/2020 8:24:50 AM    ECG 12 Lead    Result Date: 05/20/2020  ATRIAL-PACED RHYTHM WITH PROLONGED AV CONDUCTION LEFT VENTRICULAR HYPERTROPHY WITH QRS WIDENING ( R in aVL , [employer] product ) NON-SPECIFIC ST/T WAVE CHANGES ABNORMAL ECG WHEN COMPARED WITH ECG OF 17-May-2020 10:07, NO SIGNIFICANT CHANGE WAS FOUND Confirmed by Christella Noa ([id number]) on 05/20/2020 8:13:26 AM    ECG 12 Lead    Result Date: 05/18/2020  ATRIAL-PACED RHYTHM WITH PROLONGED AV CONDUCTION LEFT VENTRICULAR HYPERTROPHY WITH QRS WIDENING AND REPOLARIZATION ABNORMALITY PROLONGED QT ABNORMAL ECG WHEN COMPARED WITH ECG OF 05/25/20 18:42, NO SIGNIFICANT CHANGE WAS FOUND Confirmed by Pollyann Kennedy ([id number]) on 05/18/2020 10:46:12 PM    ECG 12 Lead    Result Date: 05/17/2020  ATRIAL-PACED RHYTHM WITH PROLONGED AV CONDUCTION LEFT VENTRICULAR HYPERTROPHY WITH QRS WIDENING AND REPOLARIZATION ABNORMALITY PROLONGED QT ABNORMAL ECG WHEN COMPARED WITH ECG OF 05/25/2020 14:20, NO SIGNIFICANT CHANGE WAS FOUND Confirmed by Pollyann Kennedy ([id number]) on 05/17/2020 8:32:34 PM    ECG 12 Lead    Result Date: 05/17/2020  ATRIAL-PACED RHYTHM WITH PROLONGED AV CONDUCTION LVH WITH REPOLARIZATION ABNORMALITY PROLONGED QT ABNORMAL ECG WHEN COMPARED WITH ECG OF 2020-05-25 13:23, NO SIGNIFICANT CHANGE WAS FOUND Confirmed by Pollyann Kennedy ([id number]) on 05/17/2020 8:05:04 PM    ECG 12 Lead    Result Date: 05/17/2020  SINUS RHYTHM  WITH  PROLONGED AV CONDUCTION MODERATE VOLTAGE CRITERIA FOR LVH, [place] BE NORMAL VARIANT ( R in aVL , [employer] product ) CANNOT RULE OUT ANTERIOR INFARCT  (CITED ON OR BEFORE 09-May-2020) T WAVE ABNORMALITY, CONSIDER LATERAL ISCHEMIA PROLONGED QT ABNORMAL ECG WHEN COMPARED WITH ECG OF 09-May-2020 09:59, SERIAL CHANGES OF ANTERIOR INFARCT  PRESENT Confirmed by Mariane Baumgarten ([id number]) on 05/17/2020 2:34:05 PM    CT Abdomen Pelvis Wo Contrast    Result Date: 05/22/2020  EXAM: CT ABDOMEN PELVIS WO CONTRAST DATE: 05/22/2020 10:34 AM ACCESSION: [id number] UN DICTATED: 05/22/2020 10:42 AM INTERPRETATION LOCATION: [facility] CLINICAL INDICATION: flank bruising, pain, evaluate for evidence of bleeding  COMPARISON: CTA 05/23/2020, outside CT 05/11/2020, CT 10/16/2013. TECHNIQUE: A spiral CT scan of the abdomen and pelvis was obtained without IV contrast from the lung bases through the pubic symphysis. Images were reconstructed in the axial plane. Coronal and sagittal reformatted images were also provided for further evaluation. FINDINGS: Evaluation of the solid organs and vasculature is limited in the absence of intravenous contrast. LINES AND TUBES: None. LOWER THORAX: Small right basilar consolidation with air bronchograms. Partially visualized pacemaker leads. Partially visualized 1.1 cm subcarinal node (2:1). HEPATOBILIARY: No focal hepatic lesions. The gallbladder is normal. No biliary dilatation.  SPLEEN: Normal PANCREAS: No ductal dilatation. No masses. ADRENALS: There is a 3.3 cm right adrenal lesion containing both soft tissue and fatty components (2, 56), which previously measured 2.0 cm in 2015. This lesion abuts the inferior vena cava/the right renal vein. There is a similar-appearing left adrenal lesion measuring 2.0 cm (2:57), previously measuring 1.5 cm in 2015. KIDNEYS/URETERS: Lobulated kidney contours bilaterally. Punctate right nephrolithiasis (2:73. Left renal cysts measuring up to 2.1 cm (2.7). BLADDER: Decompressed, limiting evaluation. No overt masses. PELVIC/REPRODUCTIVE ORGANS: Normal appearance of the prostate and seminal vesicles. GI TRACT: There is oral contrast in the stomach, appendix, and sigmoid colon. No dilated or thick walled loops of bowel. The appendix is unremarkable. PERITONEUM/RETROPERITONEUM AND MESENTERY: No free air or fluid. No evidence of hemorrhage/hematoma. LYMPH NODES: No enlarged lymph nodes. Mildly prominent retrocaval node adjacent to the right adrenal lesion measures 0.7 cm (2:56). VESSELS: Normal in caliber. Moderate calcified atherosclerosis of the abdominal aorta and iliac branches. BONES AND SOFT TISSUES: Small fat-containing umbilical hernia. No suspicious osseous lesions. Healed remote fractures of the posterolateral left sixth and seventh ribs (2:13). Soft tissue density in the left lower anterior abdominal soft tissues, likely secondary to injection.     1. No evidence of hematoma or hemorrhage as questioned. 2. Bilateral macroscopic fat-containing heterogeneous adrenal lesions right adrenal mass has significantly increased over time, now 3.3 cm, measures 2 cm in 2015. Yearly the left adrenal gland now measures 2 cm, previously 1.5 cm. Given presence of macroscopic fat, adrenal myelolipoma remains present, but given interval growth presence of soft tissue components, shorter interval follow-up is suggested to ensure stability.    XR Chest Portable    Result Date: 05/22/2020  EXAM: XR CHEST PORTABLE DATE: 05/22/2020 11:19 AM ACCESSION: [id number] UN DICTATED: 05/22/2020 11:25 AM INTERPRETATION LOCATION: [facility] CLINICAL INDICATION: 50 years old Male with SHORTNESS OF BREATH  COMPARISON: Chest radiograph 05/06/2020 TECHNIQUE: Portable Chest Radiograph. FINDINGS: Unchanged support devices. Minimal streaky bibasilar opacities, likely atelectasis. No other focal consolidation No pleural effusion or pneumothorax. Stable enlarged cardiomediastinal silhouette.     Stable cardiomegaly. No acute airspace disease.    XR Ankle 2 Views Left    Result Date: 05/17/2020  EXAM: XR ANKLE 2 VIEWS LEFT DATE: 05/17/2020 12:00 AM ACCESSION:  [id number] UN DICTATED: 05/17/2020 12:51 AM INTERPRETATION LOCATION: [facility] CLINICAL INDICATION: 50 years old Male with fall, ankle pain and swelling  COMPARISON: None. TECHNIQUE: AP, oblique and lateral views of the left ankle. FINDINGS: No fracture. Ankle is approximated. Talar dome is smooth. Degenerative changes of the midfoot and ankle joint. Diffuse lower extremity edema and swelling.     Diffuse swelling without acute osseous abnormality identified.    CTA Aortic Dissection    Result Date: 05-30-20  EXAM: CTA Chest, Abdomen, Pelvis for Aortic Dissection DATE: 05-30-2020 4:46 PM ACCESSION: [id number] UN DICTATED: 05-30-2020 4:45 PM INTERPRETATION LOCATION: [facility] CLINICAL INDICATION: 50 years old Male with c/f aortic dissection ; Aortic disease, nontraumatic ; Chest pain or back pain, aortic dissection suspected  COMPARISON: None TECHNIQUE: A helical CT of the chest was obtained without IV contrast from the thoracic inlet through the hemidiaphragms. Images were reconstructed in the axial plane. Next, a spiral CTA  of the chest, abdomen and pelvis was obtained with IV contrast from the thoracic inlet through the aortic bifurcation. Images were reconstructed in the axial plane.  Multiplanar reformatted and MIP images are provided for further evaluation of the aorta. FINDINGS: AORTA: Normal caliber aorta. No thoracic aortic intramural hematoma.  No aortic dissection. CHEST: Normal heart size.  No pericardial effusion. No mediastinal lymphadenopathy. Left chest wall ICD. Partially visualized right dual-lumen central venous catheter with the distal tip terminating in the in the suprahepatic IVC and the proximal tip likely terminating within the right atrium. Clear central airways. No consolidation.  No pleural effusion. Bibasilar groundglass opacities likely represent atelectasis. ABDOMEN and PELVIS: HEPATOBILIARY: No focal hepatic lesions. The gallbladder is present and otherwise unremarkable. No biliary dilatation.  SPLEEN: Unremarkable. PANCREAS: Unremarkable. 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 KIDNEYS/URETERS: Mildly atrophic bilateral kidneys with multiple hypoattenuating renal cysts and bilateral perinephric stranding which is likely physiological. GI TRACT: No dilated or thick walled loops of bowel. Normal appendix (4:57 PERITONEUM/RETROPERITONEUM AND MESENTERY: No free air or fluid. LYMPH NODES: No enlarged lymph nodes. VESSELS: The aorta is normal in caliber.  No significant calcified atherosclerotic disease. The portal venous system is patent. The hepatic veins and IVC are unremarkable. BONES AND SOFT TISSUES: Unremarkable.  No bowel obstruction.  No free fluid.  No abdominal or pelvic lymphadenopathy. BONES: Unremarkable. SOFT TISSUES: Skin thickening and subcutaneous fat stranding over the left lower quadrant abdominal pannus is indeterminate. Recommend clinical correlation (4:208).     -- No aortic dissection. -- Bilateral adrenal myelolipomas, as above.     XR Abdomen 1 View    Result Date: 05/24/2020  EXAM: XR ABDOMEN 1 VIEW DATE: 05/24/2020 4:47 PM ACCESSION: [id number] UN DICTATED: 05/24/2020 4:59 PM [facility]PRETATION LOCATION[facility] CLINICAL INDICATION: 50 years old Male with abdominal pain.  COMPARISON: CT dated 05/22/2020. TECHNIQUE: Supine view of the abdomen. FINDINGS: Evaluation is limited by soft tissue attenuation. Gas and stool are seen throughout the colon. Gas-filled nondilated loops of small bowel are seen. No suspicious pathologic calcifications are identified. The cardiac silhouette is enlarged. Leads are seen from a cardiac device. Surgical clips project over the left leg. No acute osseous abnormality is identified.     Moderate amount of stool throughout the colon.    CT BODY INTERPRETATION OF OUTSIDE FILM [facility])    Result Date: 05/21/2020  These images are submitted for comparison purposes only as there is a more recent CTA aortic dissection study. No interpretation  will be provided and no fee will apply.    CT BODY INTERPRETATION OF OUTSIDE FILM [facility])    Result Date: 05/20/2020  These images are submitted for comparison purposes only. No interpretation will be provided and no fee will apply.    CT NEURO INTERPRETATION OF OUTSIDE FILM    Result Date: 05/22/2020  These images are submitted for comparison purposes only. No interpretation will be provided and no fee will apply.    CT NEURO INTERPRETATION OF OUTSIDE FILM [facility])    Addendum Date: 05/22/2020    Report Replacement: The images for the original accession number [id number] Bethann Humble are now associated with [id number] UN. dmk     Result Date: 05/22/2020  These images are submitted for comparison purposes only. No interpretation will be provided and no fee will apply.    CT NEURO INTERPRETATION OF OUTSIDE FILM [facility])    Addendum Date: 05/20/2020    ATTENDING ADDENDUM: Additional axial neck images are contained on the accession #[id number] UN. Additional findings: the thyroid is diffusely enlarged without focal lesion. Right-sided jugular catheter is noted. There is no adenopathy.    Result Date: 05/20/2020  presented for interpretation 05/20/2020 5:40 PM.  The study is dated , was obtained at and is interpreted at the request of Dr. Ernst Spell [facility]. EXAM: Computed tomographic angiography, neck, with contrast material, including noncontrast images, if performed, and image postprocessing on a stand alone workstation. DATE: 05/20/2020 10:17 AM ACCESSION: [id number] UN DICTATED: 05/20/2020 5:40 PM INTERPRETATION LOCATION: [facility] CLINICAL INDICATION: 50 years old Male with R53.1-Weakness  COMPARISON: None TECHNIQUE: Contiguous axial CT angiogram of the neck during contrast bolus infusion were acquired.  Multiplanar reformatted and MIP images were provided. For selected cases, 3D volume rendered images are also provided. FINDINGS: Incomplete axial images were provided. No evidence of dissection or aneurysm in the carotid or vertebral arteries. No hemodynamically significant arterial stenosis. Scattered atherosclerotic changes noted at the bifurcation. Polypoid tortuous course of the internal carotid artery suggesting chronic hypertension. The soft tissues are unremarkable without lymphadenopathy, mass or abscess. The visualized osseous structures are unremarkable. The airway is patent and midline. The lung apices are unremarkable. In this report, if stenosis of the internal carotid artery is reported, it is calculated based on the NASCET method: (1 - (minimal residual diameter/normal diameter of distal ICA)).     No evidence of high-grade stenosis or aneurysm.    CT NEURO INTERPRETATION OF OUTSIDE FILM [facility])    Result Date: 05/20/2020  These images are submitted for comparison purposes only. No interpretation will be provided and no fee will apply.    CT NEURO INTERPRETATION OF OUTSIDE FILM [facility])    Result Date: 05/20/2020  These images are submitted for comparison purposes only. No interpretation will be provided and no fee will apply.    CT NEURO INTERPRETATION OF OUTSIDE FILM [facility])    Result Date: 05/20/2020  These images are submitted for comparison purposes only. No interpretation will be provided and no fee will apply.    CT NEURO INTERPRETATION OF OUTSIDE FILM [facility])    Addendum Date: 05/20/2020    ATTENDING ADDENDUM: Feb 14 studies better visualize this area and there is no suggestion of a lesion.     Result Date: 05/20/2020  Thoracic spine CT presented for interpretation 05/20/2020 5:11 PM.  The study is dated 05/11/2020, was obtained at [facility] and is interpreted at the request of Dr. Ernst Spell [facility]. EXAM: Computed tomography, thoracic spine without contrast material. DATE: 05/20/2020 8:52 AM ACCESSION:  [id number] UN DICTATED: 05/20/2020 5:11 PM INTERPRETATION LOCATION: [facility] CLINICAL INDICATION: 50 years old Male with R53.1-Weakness  COMPARISON: None TECHNIQUE: Axial CT images through the thoracic spine without contrast. Coronal and sagittal reformatted images, bone and soft tissue algorithm are provided. FINDINGS:  There is no thoracic spine fracture. Posterior wall of T1 and superior endplate are not well visualized. It is unclear if this is due to photopenia or a lesion at this level.  The vertebrae are normally aligned. No paravertebral soft tissue abnormality. Chronic left-sided rib fractures at T4, T5 and T6. T5 rib fracture is incompletely healed.     No evidence of a thoracic spine fracture. Poor visualization of the superior and posterior aspects of the T1 vertebral body. Recommend repeat thoracic spine CT when the patient is clinically stable.    CT NEURO INTERPRETATION OF OUTSIDE FILM [facility])    Result Date: 05/20/2020  Head CT presented for interpretation 05/20/2020 5:29 PM.  The study is dated 05/14/2020, was obtained at [facility] and is interpreted at the request of Dr. Ernst Spell [facility]. EXAM: Computed tomographic angiography, head, with contrast material, including noncontrast images, and image postprocessing. DATE: 05/20/2020 9:22 AM ACCESSION: [id number] UN DICTATED: 05/20/2020 5:29 PM INTERPRETATION LOCATION: [facility] CLINICAL INDICATION: 50 years old Male with R53.1-Weakness  COMPARISON: None TECHNIQUE: Axial images of the brain and skull are presented for review. FINDINGS: No intracranial hemorrhage, acute infarct or mass. The osseous structures are unremarkable. No midline shift. No evidence of acute infarct.     Unremarkable head CT.    ICD / Pacemaker Evaluation    Result Date: 05/17/2020  Primus Bravo, RN     05/17/2020  1:12 PM Patient is only approved for off-label MRI by the signing EP Attending.  Patient has a non-conditional device system do to the capped and abandoned RV pacing lead. Patient is not pacing dependent.  GENERATOR INFORMATION Manufacturer & Model # [employer] VIGILANT EL ICD D233/ [phone] LEAD INFORMATION Abandoned/Epicardial Lead(s)  Yes Lead Notes viable [phone] capped 4Feb2019  CXR verified 05/06/2020  Manufacturer & Model #       Prescreening Requirements *The patient has no implanted lead extenders, lead adaptors, or abandoned leads *The patient has no broken leads or leads with intermittent electrical contact, as confirmed by lead impedance history >200 ohms and <1500 ohms *Implanted for more than 6 weeks-unless EP Attending approval *Device is operating within the projected service life-Cannot be done if EOS, ERI, near ERI without Ep Attending approval *For patients whose device will be programmed to an asynchronous pacing mode when the MRI SureScan mode is programmed to On, no diaphragmatic stimulation is present when the paced leads have a pacing output of 5.0 V and a pulse width of 1.0 ms. *Pacing capture thresholds ? 2.0 V at 0.4 ms-if dependent (pacing left on)   MR CONDITIONAL STATUS AND MANAGEMENT System has been implanted more than 6 weeks?  Yes Implant date: 05/04/2017 MR NON-CONDITIONAL SYSTEM Risks and Potential Complications discussed with patient and patient consents to procedure The following conditions have been met: No fractured, epicardial, or abandoned leads MR is the best test for condition Patient is not pacemaker Dependent Device will be programmed under the direction of the Ep Attending by the Ep Device RN     ______________________________________________________________________  Discharge Instructions:           Other Instructions     Discharge instructions      I certify that based on my evaluation of this patient, this  patient requires BLS transportation services and that other forms of transport are contraindicated.  Please refer to care management transitions note for transportation details. Follow Up instructions and Outpatient Referrals     Discharge instructions            ______________________________________________________________________  Discharge Day Services:  BP 157/98  - Pulse 70  - Temp 36.5 ??C (97.7 ??F)  - Resp 17  - Ht 188 cm (6' 2)  - Wt (!) 158.6 kg (349 lb 10.4 oz)  - SpO2 100% Comment: 2L - BMI 44.89 kg/m??   Pt seen on the day of discharge and determined appropriate for discharge.    Condition at Discharge: good    Length of Discharge: I spent less than 30 mins in the discharge of this patient.

## 2020-05-28 NOTE — Unmapped (Signed)
Problem: Adult Inpatient Plan of Care  Goal: Patient-Specific Goal (Individualized)  Outcome: Ongoing - Unchanged  Goal: Optimal Comfort and Wellbeing  Outcome: Ongoing - Unchanged  Goal: Readiness for Transition of Care  Outcome: Ongoing - Unchanged     Problem: Adult Inpatient Plan of Care  Goal: Plan of Care Review  Outcome: Progressing  Goal: Absence of Hospital-Acquired Illness or Injury  Outcome: Progressing  Goal: Rounds/Family Conference  Outcome: Progressing     Problem: Pain Acute  Goal: Acceptable Pain Control and Functional Ability  Outcome: Progressing     Problem: Fall Injury Risk  Goal: Absence of Fall and Fall-Related Injury  Outcome: Progressing     Problem: Device-Related Complication Risk (Hemodialysis)  Goal: Safe, Effective Therapy Delivery  Outcome: Progressing     Problem: Hemodynamic Instability (Hemodialysis)  Goal: Effective Tissue Perfusion  Outcome: Progressing     Problem: Infection (Hemodialysis)  Goal: Absence of Infection Signs and Symptoms  Outcome: Progressing      Pt VSS. Was anxious throughout shift. Had 2x episode of what MD described as pseudo seizure. RR called for first episode of non responsiveness. Resolved within 5 minutes. BP elevated. Pt. Requires redirection when experiencing episodes of feeling SOB or choking. Minimal PO intake this shift. BS within normal range. Albuterol treatment PRN for expiatory wheezing. C/o of nausea x1. Sleeping periodically. Pt has been free from falls throughout the shift. Pt requested PRN pain medications primarily for bilateral hand pain. Will continue care plan

## 2020-05-29 DEATH — deceased

## 2020-06-08 NOTE — Unmapped (Signed)
I have reviewed the battery, threshold, lead impedance, and overall device/lead status, diagnostic data including arrhythmic events and therapies on the device evaluation on [address], [id number].       Pollyann Kennedy, [place]

## 2020-07-23 NOTE — Unmapped (Signed)
COMPLEX CASE MANAGEMENT   Brief Note    Chart Review ONLY;This patient has been reviewed for Complex Case Management services and is not eligible at this time. To have this patient reevaluated for Complex Case Management please place AMB Referral to Case Management to the Personal Health Advocate Department. Patient deceased       Jeremy Oliver - High Risk Care Coordinator   [facility]  [address] [place], [place] [address]  P: [phone] F: [phone]  [email or web address]
